# Patient Record
Sex: Female | Born: 1955 | Race: Black or African American | Hispanic: No | Marital: Single | Smoking: Former smoker
Health system: Southern US, Community
[De-identification: ages and names within clinical notes are randomized; demographics above are authoritative.]

## PROBLEM LIST (undated history)

## (undated) DIAGNOSIS — G8929 Other chronic pain: Secondary | ICD-10-CM

## (undated) DIAGNOSIS — E1140 Type 2 diabetes mellitus with diabetic neuropathy, unspecified: Secondary | ICD-10-CM

## (undated) DIAGNOSIS — I10 Essential (primary) hypertension: Secondary | ICD-10-CM

## (undated) DIAGNOSIS — M25569 Pain in unspecified knee: Secondary | ICD-10-CM

## (undated) DIAGNOSIS — E119 Type 2 diabetes mellitus without complications: Secondary | ICD-10-CM

## (undated) HISTORY — PX: OTHER SURGICAL HISTORY: SHX169

---

## 1998-05-19 ENCOUNTER — Emergency Department (HOSPITAL_COMMUNITY): Admission: EM | Admit: 1998-05-19 | Discharge: 1998-05-19 | Payer: Self-pay | Admitting: *Deleted

## 1998-05-19 ENCOUNTER — Encounter: Payer: Self-pay | Admitting: *Deleted

## 1999-07-15 ENCOUNTER — Encounter: Payer: Self-pay | Admitting: Internal Medicine

## 1999-07-15 ENCOUNTER — Encounter: Admission: RE | Admit: 1999-07-15 | Discharge: 1999-07-15 | Payer: Self-pay | Admitting: Family Medicine

## 2000-04-26 ENCOUNTER — Emergency Department (HOSPITAL_COMMUNITY): Admission: EM | Admit: 2000-04-26 | Discharge: 2000-04-26 | Payer: Self-pay | Admitting: Emergency Medicine

## 2000-04-26 ENCOUNTER — Encounter: Payer: Self-pay | Admitting: Emergency Medicine

## 2000-08-08 ENCOUNTER — Other Ambulatory Visit: Admission: RE | Admit: 2000-08-08 | Discharge: 2000-08-08 | Payer: Self-pay | Admitting: *Deleted

## 2000-08-30 ENCOUNTER — Other Ambulatory Visit: Admission: RE | Admit: 2000-08-30 | Discharge: 2000-08-30 | Payer: Self-pay | Admitting: Obstetrics and Gynecology

## 2000-08-30 ENCOUNTER — Encounter (INDEPENDENT_AMBULATORY_CARE_PROVIDER_SITE_OTHER): Payer: Self-pay | Admitting: Specialist

## 2000-09-19 ENCOUNTER — Inpatient Hospital Stay (HOSPITAL_COMMUNITY): Admission: RE | Admit: 2000-09-19 | Discharge: 2000-09-21 | Payer: Self-pay | Admitting: Obstetrics and Gynecology

## 2000-09-19 ENCOUNTER — Encounter (INDEPENDENT_AMBULATORY_CARE_PROVIDER_SITE_OTHER): Payer: Self-pay | Admitting: Specialist

## 2000-10-01 ENCOUNTER — Inpatient Hospital Stay (HOSPITAL_COMMUNITY): Admission: AD | Admit: 2000-10-01 | Discharge: 2000-10-01 | Payer: Self-pay | Admitting: Obstetrics and Gynecology

## 2001-09-07 ENCOUNTER — Other Ambulatory Visit: Admission: RE | Admit: 2001-09-07 | Discharge: 2001-09-07 | Payer: Self-pay | Admitting: Obstetrics and Gynecology

## 2001-09-24 ENCOUNTER — Encounter: Payer: Self-pay | Admitting: Obstetrics and Gynecology

## 2001-09-24 ENCOUNTER — Ambulatory Visit (HOSPITAL_COMMUNITY): Admission: RE | Admit: 2001-09-24 | Discharge: 2001-09-24 | Payer: Self-pay | Admitting: Obstetrics and Gynecology

## 2001-09-25 ENCOUNTER — Emergency Department (HOSPITAL_COMMUNITY): Admission: EM | Admit: 2001-09-25 | Discharge: 2001-09-25 | Payer: Self-pay | Admitting: Emergency Medicine

## 2002-05-03 ENCOUNTER — Encounter: Payer: Self-pay | Admitting: Obstetrics and Gynecology

## 2002-05-03 ENCOUNTER — Ambulatory Visit: Admission: RE | Admit: 2002-05-03 | Discharge: 2002-05-03 | Payer: Self-pay | Admitting: Obstetrics and Gynecology

## 2002-06-05 ENCOUNTER — Encounter (INDEPENDENT_AMBULATORY_CARE_PROVIDER_SITE_OTHER): Payer: Self-pay | Admitting: *Deleted

## 2002-06-05 ENCOUNTER — Inpatient Hospital Stay (HOSPITAL_COMMUNITY): Admission: RE | Admit: 2002-06-05 | Discharge: 2002-06-15 | Payer: Self-pay | Admitting: Obstetrics and Gynecology

## 2002-06-09 ENCOUNTER — Encounter: Payer: Self-pay | Admitting: Obstetrics and Gynecology

## 2002-06-11 ENCOUNTER — Encounter: Payer: Self-pay | Admitting: General Surgery

## 2002-09-09 ENCOUNTER — Other Ambulatory Visit: Admission: RE | Admit: 2002-09-09 | Discharge: 2002-09-09 | Payer: Self-pay | Admitting: Obstetrics and Gynecology

## 2003-03-23 ENCOUNTER — Emergency Department (HOSPITAL_COMMUNITY): Admission: EM | Admit: 2003-03-23 | Discharge: 2003-03-23 | Payer: Self-pay | Admitting: Emergency Medicine

## 2003-11-24 ENCOUNTER — Other Ambulatory Visit: Admission: RE | Admit: 2003-11-24 | Discharge: 2003-11-24 | Payer: Self-pay | Admitting: Obstetrics and Gynecology

## 2004-06-09 ENCOUNTER — Ambulatory Visit (HOSPITAL_COMMUNITY): Admission: RE | Admit: 2004-06-09 | Discharge: 2004-06-09 | Payer: Self-pay | Admitting: Orthopedic Surgery

## 2004-06-09 ENCOUNTER — Ambulatory Visit (HOSPITAL_BASED_OUTPATIENT_CLINIC_OR_DEPARTMENT_OTHER): Admission: RE | Admit: 2004-06-09 | Discharge: 2004-06-09 | Payer: Self-pay | Admitting: Orthopedic Surgery

## 2005-06-07 ENCOUNTER — Emergency Department (HOSPITAL_COMMUNITY): Admission: EM | Admit: 2005-06-07 | Discharge: 2005-06-07 | Payer: Self-pay | Admitting: Family Medicine

## 2005-09-13 ENCOUNTER — Emergency Department (HOSPITAL_COMMUNITY): Admission: EM | Admit: 2005-09-13 | Discharge: 2005-09-13 | Payer: Self-pay | Admitting: Family Medicine

## 2007-01-26 ENCOUNTER — Other Ambulatory Visit: Admission: RE | Admit: 2007-01-26 | Discharge: 2007-01-26 | Payer: Self-pay | Admitting: Obstetrics and Gynecology

## 2007-09-18 ENCOUNTER — Emergency Department (HOSPITAL_COMMUNITY): Admission: EM | Admit: 2007-09-18 | Discharge: 2007-09-18 | Payer: Self-pay | Admitting: Emergency Medicine

## 2008-07-01 ENCOUNTER — Emergency Department (HOSPITAL_COMMUNITY): Admission: EM | Admit: 2008-07-01 | Discharge: 2008-07-01 | Payer: Self-pay | Admitting: Family Medicine

## 2010-06-01 LAB — GLUCOSE, CAPILLARY: Glucose-Capillary: 100 mg/dL — ABNORMAL HIGH (ref 70–99)

## 2010-07-09 NOTE — H&P (Signed)
NAME:  [name] [name], [name] [name]                      ACCOUNT NO.:  [id number]   MEDICAL RECORD NO.:  [id number]                   PATIENT TYPE:  INP   LOCATION:  NA                                   FACILITY:  [facility]   PHYSICIAN:  James A. Zigmund Daniel, M.D.             DATE OF BIRTH:  May 13, 1955   DATE OF ADMISSION:  06/05/2002  DATE OF DISCHARGE:                                HISTORY & PHYSICAL   HISTORY OF PRESENT ILLNESS:  The patient is a 55 year old gravida 2, para 1,  AB 1, status post total abdominal hysterectomy who was found July 2003 to  have bilateral ovarian cysts after complaining of left lower quadrant  discomfort.  At that time she was attempting to get an issue with  hypertension under control and was told she would need to get that under  control before becoming an operative candidate.  CA 125 was drawn and was  normal.  The patient also had a CT scan August 2003 which revealed bilateral  adnexal masses.  The one on the left side measured 8 cm in greatest diameter  and contained a few septations.  The one on the right appeared to be  approximately 8.7 cm in greatest diameter, multiloculated, with at least two  areas of septation.  There was also a 2 cm inguinal lymph node identified on  the right.  For these reasons, the patient now felt to be a candidate for  exploratory laparotomy with bilateral salpingo-oophorectomy and indicated  procedures.  Due to the uncertainty regarding the diagnosis, she has been  asked to have a bowel prep and will be done at [facility] with Dr. Carlena Bjornstad on standby.   MEDICATIONS:  1. Norvasc one daily.  2. Lotensin one daily.  3. Xanax p.r.n.   PAST MEDICAL HISTORY:  Hypertension.   PAST SURGICAL HISTORY:  1. Right partial mastectomy - benign.  2. Appendectomy as a child.  3. Total abdominal hysterectomy with lysis of omental adhesions.   ALLERGIES:  1. DARVOCET.  2. IBUPROFEN.   FAMILY HISTORY:  Hypertension.   SOCIAL  HISTORY:  The patient denies use of tobacco.  She does drink alcohol,  possibly excessively.   REVIEW OF SYSTEMS:  Noncontributory.   PHYSICAL EXAMINATION:  GENERAL:  Well-developed, well-nourished pleasant  black female in no acute distress.  VITAL SIGNS:  Afebrile; vital signs stable.  SKIN:  Warm and dry without lesions.  LYMPHATICS:  There is no supraclavicular, cervical, or inguinal adenopathy.  HEENT:  Normocephalic.  NECK:  Supple without thyromegaly.  CHEST:  Lungs are clear.  CARDIAC:  Regular rate and rhythm without murmurs, gallops, or rubs.  BREASTS:  Exam deferred.  ABDOMEN:  Soft, nontender without masses or organomegaly.  There is a  longitudinal surgical scar between the umbilicus and symphysis pubis  consistent with an incision that healed by secondary intention.  PELVIC:  Exam deferred until examination under anesthesia.  IMPRESSION:  1. Bilateral adnexal mass - differential includes benign neoplasm,     endometriosis, physiologic.  Doubt malignant neoplasm.  2. Left lower quadrant discomfort - probably secondary to bilateral adnexal     mass.  3. Hypertension.  4. Alcohol use, possible abuse.   PLAN:  Exploratory laparotomy with bilateral salpingo-oophorectomy.  Risks,  benefits, complications and alternatives fully discussed with the patient.  The possibility of staging procedure discussed and accepted.  Questions  invited and answered.                                               James A. Zigmund Daniel, M.D.    JAM/MEDQ  D:  06/05/2002  T:  06/05/2002  Job:  [id number]

## 2010-07-09 NOTE — Op Note (Signed)
NAME:  [name] [name], [name] [name]                      ACCOUNT NO.:  [id number]   MEDICAL RECORD NO.:  [id number]                   PATIENT TYPE:  INP   LOCATION:  X004                                 [facility]AC[facility]  [facility]   PHYSICIAN:  Gerda Diss. Zigmund Daniel, M.D.             DATE OF BIRTH:  [birth year]   DATE OF PROCEDURE:  06/05/2002  DATE OF DISCHARGE:                                 OPERATIVE REPORT   PREOPERATIVE DIAGNOSES:  1. Bilateral adnexal masses.  2. Status post hysterectomy.   POSTOPERATIVE DIAGNOSES:  1. Left adnexal cyst.  2. Peritoneal cyst.  3. Multiple pelvic adhesions including the omentum to the anterior abdominal     wall and multiple loops of small bowel and small adhesed bowel to the     adnexa bilaterally.   PROCEDURE:  1. Exploratory laparotomy.  2. Bilateral salpingo-oophorectomy.  3. Lysis of adhesions.  4. Removal of peritoneal cyst.   SURGEON:  James A. Zigmund Daniel, M.D.   ASSISTANT:  Joaquin Bend, M.D.   ANESTHESIA:  General.   ESTIMATED BLOOD LOSS:  Less than 100 mL.   COMPLICATIONS:  None.   PACKS AND DRAINS:  Foley.   COUNTS:  Sponge, needle, and instrument count reported as correct x2.   PROCEDURE:  The patient was taken to the operating room placed in a dorsal  supine position.  After adequate general anesthesia was administered she was  prepped and draped in the usual manner for abdominal surgery.  A  longitudinal incision was made in the midline between the previous two  paramedian longitudinal scars.  The incision was carried to the umbilicus to  the symphysis pubis and slightly to the left of the umbilicus as well.  The  subcutaneous tissues were sharply and bluntly dissected down into the fascia  which was nicked with a knife and incised longitudinally with Mayo scissors.  Upon entry into the abdominal cavity it was discerned that patient had  several adhesions from the omentum to the anterior abdominal wall which  needed to be reduced in  order to carry out the dissection.  These were  clamped, cut, and secured with 3-0 Vicryl ties.  As the dissection was  carried further, the adhesions between multiple loops of small bowel and  remainder of the pelvis were encountered and gradually dissected free as  best as possible.  Peritoneal washings were then obtained and submitted  separately to nephrology for cytologic studies.  After dissection was  carried further, a large cystic structure was encountered which  spontaneously ruptured and released a large amount of clear fluid.  Adhesions were further lysed revealing the left ovary which was adherent to  the colon as well as to the left pelvic side wall.  The infundibulopelvic  ligament was isolated, clamped, cut, and doubly secured with number 1  chromic catgut.  The left ovary and what appeared to be a portion of the  left fallopian tube  were gradually dissected free from the left pelvic side  wall as well as from the left colon.  It was submitted to pathology for  histologic studies including frozen section.  The frozen section returned as  benign.  As the dissection was carried to the patient's right side a  peritoneal cyst was encountered.  It was approximately 2 cm in diameter.  It  was dissected free and excised separately and submitted separately to  pathology for histologic studies.  As the adhesions were further lysed from  the right aspect of the patient's pelvis, the right ovary came into view,  yet it was similarly adherent to the right pelvic side wall and appeared to  have, at least a rudimentary portion of fallopian tube contained with it.  The infundibulopelvic ligament was isolated.  It was clamped, cut, and  doubly secured with number 1 chromic catgut.  The right ovary was gradually  dissected free from the right pelvic side wall without difficulty.  The  ureters were noted bilaterally well below the plane of dissection.  The  above procedure was accomplished  with a Balfour and placed with the small  bowel packed cephalad as best as possible.  It was impossible to remove all  of the adhesions from the multiple loops of small bowel and the omentum to  the anterior abdominal wall.  However, adhesiolysis was accomplished as best  as possible and hemostasis noted.  Copious irrigation was accomplished.  At  this point the patient was felt to have benefitted maximally from the  surgical procedure.  All of the packs were removed.  The fascia and  peritoneum were then closed with a number 1 PDS in a modified Smead-Jones  fashion.  The subcutaneous layer was thoroughly irrigated.  The skin was  then closed with staples. The patient tolerated the procedure extremely well  and was returned to the recovery room in good condition.  At the conclusion  of the procedure the urine was clear and copious.                                               James A. Zigmund Daniel, M.D.    JAM/MEDQ  D:  06/05/2002  T:  06/05/2002  Job:  [id number]

## 2010-07-09 NOTE — H&P (Signed)
[facility]  Patient:    [name] [name], [name] [name]                   MRN: [id number] Adm. Date:  [id number] Attending:  Kendal Hymen                         History and Physical  HISTORY OF PRESENT ILLNESS:   The patient is a 55 year old gravida 2, para 2 who presented to me approximately Sep 12, 2000 after the death of her physician.  She had a known pelvoabdominal mass and was in the process of discussing surgical removal, when her doctor died.  She has a known fibroid uterus.  She also has abnormal uterine bleeding.  Endometrial biopsy was performed 2000/09/12, which revealed weakly proliferative endometrium.  The patient is for total abdominal hysterectomy with possible bilateral salpingo-oophorectomy.  PAST MEDICAL HISTORY:  MEDICAL:                      Hypertension.  SURGICAL:                     1.  Right partial mastectomy -- benign.                               2.  Appendectomy as a child.  ALLERGIES:                    DARVOCET, IBUPROFEN.  MEDICATIONS:                  1.  Norvasc one p.o. q.d.                               2.  Lotensin one p.o. q.d.  FAMILY HISTORY:               Positive for hypertension.  SOCIAL HISTORY:               The patient denies the use of tobacco.  She does drink alcohol.  REVIEW OF SYSTEMS:            Noncontributory.  PHYSICAL EXAMINATION:  GENERAL:                      Well-developed, well-nourished, pleasant black female; in no acute distress.  VITAL SIGNS:                  Afebrile.  Vital signs stable.  SKIN:                         Warm and dry without lesions.  LYMPH:                        There are no supraclavicular, cervical or inguinal adenopathy.  HEENT:                        Normocephalic, atraumatic.  NECK:                         Supple without thyromegaly.  CHEST:  Lungs are clear.  CARDIAC:                      Regular rate and rhythm without  murmurs, gallops or rubs.  BREAST:                       Deferred.  ABDOMEN:                      Soft an nontender; without masses or organomegaly.  There is a longitudinal surgical scar between the umbilicus and symphysis pubis, consistent with an incision that healed by secondary intention.  PELVIC:                       Deferred until examination under anesthesia.  ACCESSORY CLINICAL FINDINGS:  Ultrasound performed August 31, 2000 revealed a fibroid uterus, with largest fibroid being 4.8 cm in greatest diameter.  In addition there was a 2.2 cm right adnexal cyst and a 2.6 cm left adnexal cyst.  IMPRESSION:                   1.  Fibroid uterus.                               2.  Bilateral ovarian cysts, largely cystic                                   follicles.                               3.  Abnormal uterine bleeding, probably                                   secondary to #1.                               4.  Hypertension.                               5.  Alcohol use.  PLAN:                         Total abdominal hysterectomy with possible bilateral salpingo-oophorectomy and indicated procedures.  Risks, benefits, complications and alternatives were fully discussed with the patient.  She states she understands and accepts.  Decision on whether to preserve or remove the ovaries and fallopian tubes is left to the operators sole discretion intraoperatively.  In the event of surgical removal, the patient understands she would need to consider hormone replacement therapy for the indefinite future.  She agrees to same. DD:  09/19/00 TD:  09/19/00 Job: [id number]

## 2010-07-09 NOTE — Discharge Summary (Signed)
[facility]  Patient:    [name] [name], [name] [name] Visit Number: [id number] MRN: [id number]          Service Type: Attending:  Gerda Diss. Zigmund Daniel, M.D. Dictated by:   Gerda Diss Zigmund Daniel, M.D. Adm. Date:  09/19/00 Disc. Date: 09/21/00                             Discharge Summary  DISCHARGE DIAGNOSES:          1. Fibroid uterus.                               2. Adenomyosis.                               3. Abnormal uterine bleeding.                               4. Omental adhesions to the anterior abdominal                                  wall.  OPERATIONS AND SPECIAL PROCEDURES:           1. Total abdominal hysterectomy.                               2. Lysis of adhesions.  CONSULTATIONS:                None.  DISCHARGE MEDICATIONS:        1. Tylox one p.o. q.3-4h. p.r.n. pain.                               2. Ativan 2 mg q.4-6h. p.r.n. agitation.  HISTORY AND PHYSICAL:         This is a 55 year old, gravida 2, para 2, who presented approximately Sep 11, 2022 after the death of her physician with a nonpalpable abdominal mass which she wanted removed. She had a known fibroid uterus and also had experienced abnormal uterine bleeding. Endometrial biopsy was performed 2022-09-11 which revealed weakly proliferative endometrium. For the remainder of the history and physical, please see chart.  HOSPITAL COURSE:              The patient was admitted to [facility]. Admission laboratory studies were drawn. On July 30,2002 she was taken to the operating room and underwent total abdominal hysterectomy along with lysis of omental adhesions to the anterior abdominal wall. The procedure was uncomplicated. Postoperative course was benign. The patient was discharged on the second postoperative day afebrile and in satisfactory condition. It should be mentioned that the anesthesia department gleaned from the amount of medication the patient required  intraoperatively but not gleaned from the amount of agitation postoperatively, that the patient may have an issue with alcohol consumption. This issue may have prompted her to want to go home on the second postoperative day as opposed to later. Nonetheless, she appeared stable for discharge on the second postoperative day and was discharged home ins satisfactory condition.  ACCESSORY CLINICAL FINDINGS:  Hemoglobin and hematocrit on admission were 10-.3 and 30.3, respectively. Repeat  values were obtained July 31,2002 and revealed values of 8.1 and 24.5, respectively. CMET was obtained and was normal. Urinalysis was obtained and was essentially normal as well.  DISPOSITION:                  The patient is to return to [facility] in four to six weeks for postoperative evaluation. Dictated by:   Gerda Diss Zigmund Daniel, M.D. Attending:  Gerda Diss Zigmund Daniel, M.D. DD:  10/26/00 TD:  10/26/00 Job: [id number]

## 2010-07-09 NOTE — Discharge Summary (Signed)
NAME:  [name] [name], [name] [name]                      ACCOUNT NO.:  [id number]   MEDICAL RECORD NO.:  [id number]                   PATIENT TYPE:  INP   LOCATION:  [id number]                                 FACILITY:  [facility]   PHYSICIAN:  Gerda Diss. Zigmund Daniel, M.D.             DATE OF BIRTH:  [birth year]   DATE OF ADMISSION:  06/05/2002  DATE OF DISCHARGE:  06/15/2002                                 DISCHARGE SUMMARY   DISCHARGE DIAGNOSES:  1. 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  2. Focal endometriosis.  3. Tuboovarian adhesions.  4. Hypertension.  5. Alcohol use, possible abuse.  6. Postoperative ileus.   OPERATIONS/PROCEDURES/TREATMENT:  Exploratory laparotomy with bilateral  salpingo-oophorectomy and lysis of adhesions.   CONSULTATIONS:  General surgery, Dr. Zella Richer.   DISCHARGE MEDICATIONS:  Lopressor, Phenergan, Demerol, Ampicillin as per  discharge instruction sheet.   HISTORY OF PRESENT ILLNESS:  This is a 55 year old gravida 2, para 1, AB 1  who is status post total abdominal hysterectomy. Found in July of 2003 to  have bilateral ovarian cysts after complaining of left lower quadrant  discomfort. The patient had a CT scan August of 2003, which revealed  bilateral adnexal masses. The one on the left appeared to be approximately 8  cm in greatest diameter. The one on the right appeared to be approximately  8.7 cm in greatest diameter, multiloculated, with at least two areas of  septations.  She was bowel prepped and brought in for exploratory laparotomy  secondary to same. Dr. Fermin Schwab was on standby.   HOSPITAL COURSE:  The patient was admitted to [facility].  Admission laboratory studies were drawn. On June 05, 2002 she underwent  exploratory laparotomy with bilateral salpingo-oophorectomy and lysis of  adhesions. The procedure was performed by Dr. Lorriane Shire with Dr. Marjory Lies  assisting. The patient's postoperative course was initially  complicated by  transient fever. The etiology of the fever was somewhat elusive. However, it  appeared to be short lived. The patient continued to have lower abdominal  discomfort and poor bowel sounds and progression of GI activity. Dr.  Zella Richer was consulted from general surgery. His diagnosis was  postoperative ileus and hypokalemia. Over the ensuing days, the patient was  initially given an NG tube, which she eventually required removal. The diet  was advanced gingerly and the ileus gradually resolved. In addition to the  surgical consult, a second medical consult was obtained from the patient's  family practice doctor, Dr. Delilah Shan. This was in an effort to optimize her  blood pressure. Dr. Delilah Shan will authorize changes in her anti-hypertensive  medication regimen. She ultimately was normotensive and on June 15, 2002  she was felt stable for discharge home.   ACCESSORY CLINICAL FINDINGS:  Hemoglobin and hematocrit were obtained on  admission and revealed values of 11.1 and 33.5 respectively. White blood  count was 11.1 on the day of admission. Numerous CBC's were obtained  throughout the hospitalization, the last of which was June 14, 2002  revealing hemoglobin of 12.4 and hematocrit 38.3 and white blood count  12,400. Type and Rh revealed B+ blood. Blood culture revealed no growth.    DISPOSITION:  The patient is to return to [facility] and Obst[facility]s  in 4-6 weeks for postoperative evaluation.                                               James A. Zigmund Daniel, M.D.    JAM/MEDQ  D:  08/14/2002  T:  08/14/2002  Job:  [id number]   cc:   Odis Hollingshead, M.D.  [id number] [address] [place] [address]  Fax: [phone]   Marti Sleigh, M.D.   Hector Shade, M.D.

## 2010-07-09 NOTE — Op Note (Signed)
[facility]  Patient:    ZONA, MICHALIK                   MRN: [id number] Proc. Date: 09/19/00 Adm. Date:  [id number] Attending:  Kendal Hymen                           Operative Report  PREOPERATIVE DIAGNOSES:       1. Fibroid uterus.                               2. Abnormal uterine bleeding probably secondary                                  to #1.  POSTOPERATIVE DIAGNOSES:      1. Fibroid uterus.                               2. Omental adhesions to the anterior abdominal                                  wall.  OPERATION:                    1. Total abdominal hysterectomy.                               2. Lysis of adhesions.  SURGEON:                      Gerda Diss. Zigmund Daniel, M.D.  ASSISTANT:                    Joaquin Bend, M.D.  ANESTHESIA:                   General endotracheal.  ESTIMATED BLOOD LOSS:         350 cc.  COMPLICATIONS:                None.  PACKS AND DRAINS:             Foley.  SPONGE, NEEDLE, AND INSTRUMENT COUNTS:  Reported as correct x 2.  DESCRIPTION OF PROCEDURE:     The patient was taken to the operating room and placed in the dorsosupine position.  After adequate general anesthesia was administered, she was placed in the lithotomy position and prepped and draped in the usual manner for abdominal and vaginal surgery.  She was then returned to the supine position.  Foley catheter was placed.  A longitudinal incision was made from the umbilicus to the symphysis pubis. The subcutaneous tissues were sharply and bluntly dissected down to the fascia which was nicked with a knife and incised longitudinally with Mayo scissors. The underlying rectus muscles were separated in the midline, exposing the peritoneum which was elevated with hemostats and entered atraumatically with Metzenbaum scissors.  The incision was extended longitudinally.  The upper abdomen was explored, and omental adhesions were found  to the anterior abdominal wall.  These were lysed as much as possible.  There were some very high omental adhesions to the anterior abdominal wall which were  left untouched as they were out of the field of dissection and felt to be too risky to approach.  The upper abdomen was explored.  The kidneys were present bilaterally.   There was no periaortic adenopathy.  The liver surface was smooth and glistening.  The Balfour retractor was then placed.  Fibroid uterus was noted.  Upper abdomen was packed off.  The ovaries and fallopian tubes appeared to be normal except for some filmy adhesions which were easily lysed. The round ligaments were clamped, cut, and secured with #1 chromic catgut.  A bladder flap was created by incising the anterior uterine serosa and sharply and bluntly dissecting the bladder inferiorly.  The utero-ovarian ligaments, round ligaments, and fallopian tubes were grasped bilaterally, clamped, cut, and doubly secured with #1 chromic catgut.  The ovaries and fallopian tubes were thus preserved.  Uterine vessels were skeletonized.  The uterine vessels were clamped, cut, and doubly secured with #1 chromic catgut.  After this was completed, the corpus was excised with a knife and submitted to pathology for histologic studies.  Additional bites along the cervix were taken with Heaney Ballentine clamps, paying careful attention to hug the cervix and avoid any trauma to the ureters.  One small arterial vessel on the right aspect of the cervix was controlled with right angle clamp and 2-0 interrupted chromic suture.  The vagina was entered anteriorly, and the cervix was excised with Satinsky scissors.  Richardson angle sutures were placed on the lateral vaginal angles.  A circumferential running locking 2-0 Vicryl suture was placed on the vaginal cuff in order to provide hemostasis.  Hemostasis was noted.  One additional interrupted 0 chromic suture was placed in the midline. At  this point, careful inspection was conducted.  The adnexal pedicles were dry.  The area of the parametrial tissues and vaginal cuff appeared to be dry as well.  The ureters were noted to be below the plane of dissection.  Copious irrigation was then accomplished.  After suctioning, the field continued to be dry.  The packs were removed.  The abdomen was then closed with 0 PDS using a modified Smead-Jones technique.  The subcutaneous layer was closed with 0 plain suture.  The skin was closed with staples.  The patient tolerated the procedure extremely well and was returned to the recovery room in good condition. DD:  09/19/00 TD:  09/19/00 Job: [id number]

## 2010-07-09 NOTE — Consult Note (Signed)
NAMECYNDEL, [name] NO.:  [id number]   MEDICAL RECORD NO.:  [id number]                    PATIENT TYPE:   LOCATION:                                       FACILITY:   PHYSICIAN:  Odis Hollingshead, M.D.            DATE OF BIRTH:   DATE OF CONSULTATION:  DATE OF DISCHARGE:                                   CONSULTATION   REASON FOR CONSULTATION:  Ileus, question small bowel obstruction.   HISTORY OF PRESENT ILLNESS:  [name] [name] is a 55 year old female who is now  postoperative day for from a bilateral salpingo-oophorectomy and lysis of  adhesions. She has developed abdominal distention with nausea and vomiting.  An NG tube was attempted but she refused to let it remain in. She states  that the emesis is bilious in fairly large amounts. She does feel distended.  She is passing a little flatus. She has not had a bowel movement. Of note,  she has had two prior abdominal operations including an appendectomy and  total abdominal hysterectomy. She currently is fairly comfortable with  minimal pain.   PAST MEDICAL HISTORY:  Hypertension.   PAST SURGICAL HISTORY:  1. Right breast biopsy.  2. Appendectomy.  3. Total abdominal hysterectomy.   CURRENT MEDICATIONS:  Include HCTZ, Avapro, Protonix, Xanax every four  hours, Unasyn, Percocet and Phenergan.   REVIEW OF SYSTEMS:  GI: Denies problems with constipation or diarrhea at  home. Denies any diverticular disease, hiatal hernia, or peptic ulcer  disease. GU: She is having a little trouble voiding now and having to strain  some.   PHYSICAL EXAMINATION:  GENERAL: A comfortable female who appears to be in no  acute distress at this time.  VITAL SIGNS: Temperature 101.1, blood pressure 202/94, pulse 114.  ABDOMEN: Slightly firm and distended. There is mild peri-incisional  tenderness. There is a low midline wound that is clean and intact. She has  occasional bowel sounds noted.   LABORATORY DATA:   Remarkable for WBC count of 11,100 and potassium of 3.4.   DIAGNOSTIC IMPRESSION:  Abdominal x-ray's demonstrate some air in some small  bowel and colon but no significant dilatation. There are also a few air  fluids levels noted.   IMPRESSION:  Postoperative abdominal distention with nausea and vomiting. I  favor a postoperative ileus versus partial small bowel obstruction.  Unfortunately, she has refused NG tube. She does have some mild hypokalemia  as well.    PLAN:  Recommend correcting her potassium and continuing the bowel rest in  the hopes that the ileus will resolved. Also try to minimize narcotics as  much as possible.                                               [name] [name].  [name] [name], M.D.    [name] [name]  D:  06/09/2002  T:  06/09/2002  Job:  [id number]

## 2010-07-09 NOTE — Op Note (Signed)
NAMEMEILING, TOOKS            ACCOUNT NO.:  [id number]   MEDICAL RECORD NO.:  [id number]          PATIENT TYPE:  AMB   LOCATION:  [place]                          FACILITY:  [facility]   PHYSICIAN:  Kathalene Frames. Mayer Camel, M.D.   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   PROCEDURE:  Left knee arthroscopic removal of cartilaginous loose bodies,  debridement of grade 3 to grade 4 chondromalacia global to the medial  compartment of the left knee and removal of a complex medial meniscal tear.   SURGEON:  Kathalene Frames. Mayer Camel, M.D.   FIRST ASSISTANT:  None.   ANESTHETIC:  General LMA.   ESTIMATED BLOOD LOSS:  Minimal.   FLUID REPLACEMENT:  550 cc crystalloid.   DRAINS PLACED:  None.   INDICATIONS FOR PROCEDURE:  55 year old woman with known arthritic changes  to her left knee almost bone-on-bone, arthritic changes the medial  compartment, possible loose bodies, catching, popping and near locking  episodes, also consistent with a medial meniscal tear, little if any lateral  arthritis and minimal arthritic changes to the patellofemoral joint. She may  in fact need a knee replacement. However prior to this large procedure, she  would like to attempt arthroscopic cleanout and lavage in order to gain some  time, decrease pain and increase function. Risks, benefits of surgery well  understood by the patient.   DESCRIPTION OF PROCEDURE:  The patient identified by armband, taken to the  operating room at [facility] day surgery center. Appropriate anesthetic monitors  were attached and general LMA anesthesia induced with the patient in the  supine position. Lateral post applied to the table. Appropriate anesthetic  monitors attached and the patient underwent general LMA anesthesia.  The  left lower extremities then prepped and draped in a sterile fashion from the  ankle to the midthigh and using a #11 blade, standard portals were made  inferolateral and inferomedial to the patella after first infiltrating the  area with 2 to 3 cc of half percent Marcaine and epinephrine solution and  another 17 cc into the joint itself using an 18 gauge needle. The  arthroscope was inserted into the inferolateral portal. Diagnostic  arthroscopy revealed a relatively normal patella, grade 3 chondromalacia of  the trochlea which was debrided with some flap tears. Moving into the medial  side we immediately identified grade 3 flap tears surrounding a grade 4  lesion to the distal and posterior femoral condyles as well as the tibial  plateau. The patient also had degenerative extensive tearing of the medial  meniscus medially and posteriorly and this was debrided using a 3.5 gator  sucker shaver as well.  Cartilaginous loose bodies were also encountered in  the suprapatellar pouch and on the medial side and these were taken through  the outflow or through the gator sucker shaver. The ACL  and PCL were intact.  On the lateral side the patient had minimal degenerative tearing of the  lateral meniscus which was lightly debrided as was grade 2 to 3  chondromalacia of the lateral tibial condyle.  At this point the knee was  thoroughly irrigated out with normal saline solution. The gutters cleared  medially and laterally. The arthroscopic instruments removed and a dressing  of Xeroform, 4x4 dressing sponges, Webril and Ace wrap applied. The patient  was then awakened and taken to the recovery room without difficulty.      FJR/MEDQ  D:  06/09/2004  T:  06/09/2004  Job:  [id number]

## 2010-07-09 NOTE — Op Note (Signed)
NAMEZIYAN, BORGA            ACCOUNT NO.:  [id number]   MEDICAL RECORD NO.:  [id number]          PATIENT TYPE:  AMB   LOCATION:  [place]                          FACILITY:  [place]   PHYSICIAN:  Kathalene Frames. Mayer Camel, M.D.   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   PROCEDURE:  Left knee arthroscopic partial medial meniscectomy, debridement  of chondromalacia medial femoral condyle, medial tibial condyle and removal  of loose body.   SURGEON:  Kathalene Frames. Mayer Camel, M.D.   FIRST ASSISTANT:  None.   ANESTHETIC:  General.   ESTIMATED BLOOD LOSS:  Minimal.   FLUID REPLACEMENT:  800 mL crystalloid.   DRAINS PLACED:  None.   TOURNIQUET TIME:  None.   INDICATIONS FOR PROCEDURE:  A 55 year old woman with significant arthritic  changes to her left knee consistent with osteoarthritis who has mechanical  catching and locking episodes and has failed conservative treatment. As a  final conservative measure prior to a total knee arthroplasty, she is taken  for arthroscopic debridement of a presumed medial meniscal tear, removal  loose bodies and debridement of chondromalacia. Risks and benefits of  surgery are well understood by the patient.   DESCRIPTION OF PROCEDURE:  The patient identified by armband, taken the  operating room at [facility].  Appropriate anesthetic monitors were attached and general LMA anesthesia  induced with the patient in supine position. Lateral post was applied to the  table and  the left lower extremity prepped and draped in sterile fashion  from the ankle to the mid thigh. Using a #11 blade, a standard inferomedial  and inferolateral peripatellar portals were then made in the left knee  allowing introduction of the arthroscope through the inferolateral portal  and the outflow through the inferomedial portal. Diagnostic arthroscopy  revealed multiple  cartilaginous loose bodies in the joint fluid which were  taken through the outflow piecemeal with the arthroscopic baskets. The  patellofemoral joint had minimal chondromalacia, but moving to the medial  compartment, grade III to grade IV chondromalacia global of the medial  femoral condyle, medial tibial condyle was identified and debrided with a  3.5 gator sucker shaver as well as extensive degenerative and flap tearing  of the medial meniscus likewise debrided with the gator sucker shaver and  basket forceps. The ACL and PCL noted to be intact although there were notch  osteophytes, lateral compartment of minimal arthritic changes, although  there were some loose bodies noted in the lateral compartments as well. At  this point, the knee was lavaged out with normal saline solution. The  gutters were cleared medially and laterally. The arthroscope was taken  medial  and lateral to the PCL clearing the posterior compartments. The  arthroscopic instruments removed and a dressing of Xeroform, 4x4 dressing  sponges, Webril and Ace wrap applied. The patient was then awakened and  taken to the recovery room without difficulty.      FJR/MEDQ  D:  06/21/2004  T:  06/21/2004  Job:  [id number]

## 2010-11-19 LAB — URIC ACID: Uric Acid, Serum: 10.2 — ABNORMAL HIGH

## 2012-02-29 ENCOUNTER — Emergency Department (HOSPITAL_COMMUNITY)
Admission: EM | Admit: 2012-02-29 | Discharge: 2012-02-29 | Disposition: A | Discharge status: Home / Self Care | Payer: Non-veteran care | Attending: Emergency Medicine | Admitting: Emergency Medicine

## 2012-02-29 ENCOUNTER — Encounter (HOSPITAL_COMMUNITY): Payer: Self-pay | Admitting: Emergency Medicine

## 2012-02-29 DIAGNOSIS — E1149 Type 2 diabetes mellitus with other diabetic neurological complication: Secondary | ICD-10-CM | POA: Insufficient documentation

## 2012-02-29 DIAGNOSIS — E1140 Type 2 diabetes mellitus with diabetic neuropathy, unspecified: Secondary | ICD-10-CM

## 2012-02-29 DIAGNOSIS — G589 Mononeuropathy, unspecified: Secondary | ICD-10-CM | POA: Insufficient documentation

## 2012-02-29 DIAGNOSIS — F411 Generalized anxiety disorder: Secondary | ICD-10-CM | POA: Insufficient documentation

## 2012-02-29 DIAGNOSIS — M259 Joint disorder, unspecified: Secondary | ICD-10-CM | POA: Insufficient documentation

## 2012-02-29 DIAGNOSIS — Z79899 Other long term (current) drug therapy: Secondary | ICD-10-CM | POA: Insufficient documentation

## 2012-02-29 HISTORY — DX: Type 2 diabetes mellitus with diabetic neuropathy, unspecified: E11.40

## 2012-02-29 HISTORY — DX: Type 2 diabetes mellitus without complications: E11.9

## 2012-02-29 MED ORDER — ALPRAZOLAM 0.5 MG PO TABS
1.0000 mg | ORAL_TABLET | Freq: Once | ORAL | Status: AC
  Administered 2012-02-29: 1 mg via ORAL
  Filled 2012-02-29: qty 2

## 2012-02-29 MED ORDER — GABAPENTIN 300 MG PO CAPS
300.0000 mg | ORAL_CAPSULE | Freq: Once | ORAL | Status: AC
  Administered 2012-02-29: 300 mg via ORAL
  Filled 2012-02-29: qty 1

## 2012-02-29 MED ORDER — HYDROXYZINE HCL 25 MG PO TABS
25.0000 mg | ORAL_TABLET | Freq: Once | ORAL | Status: AC
  Administered 2012-02-29: 25 mg via ORAL
  Filled 2012-02-29: qty 1

## 2012-02-29 MED ORDER — ALPRAZOLAM 0.5 MG PO TABS: 0.5000 mg | ORAL_TABLET | Freq: Every evening | ORAL | Status: DC | PRN

## 2012-02-29 MED ORDER — HYDROXYZINE HCL 25 MG PO TABS: 25.0000 mg | ORAL_TABLET | Freq: Four times a day (QID) | ORAL | Status: DC

## 2012-02-29 NOTE — ED Provider Notes (Signed)
History   This chart was scribed for [name], PA-C working with Kathalene Frames, MD by Marin Comment, ED Scribe. This patient was seen in room WTR5/WTR5 and the patient's care was started at 1713.   CSN: [id number]  Arrival date & time 02/29/12  1706   First MD Initiated Contact with Patient 02/29/12 1713      Chief Complaint  Patient presents with  . Foot Burn    The history is provided by the patient. No language interpreter was used.   [name] [name] is a 57 y.o. female who presents to the Emergency Department complaining of constant, gradually worsening bilateral foot pain. She describes the pain as burning with associated itching. She states that she has a h/o DM Type II and neuropathy. She states that her blood sugars are not under controlled. She takes Metformin 1000 units in the morning and night, as well as Glipizide orally. She takes Gabapentin for her neuropathy normally with relief, but states that she hasn't taken any today. She states that she last saw her PCP, Dr. August Albino a month ago. She denies taking anything for her symptoms PTA. She also complains of anxiety which she states she has a hx of. She normally takes Xanax but states she hasn't taken any today. She denies any fevers, chills, nausea or vomiting. She denies any numbness or weakness.   No past medical history on file.  No past surgical history on file.  No family history on file.  History  Substance Use Topics  . Smoking status: Not on file  . Smokeless tobacco: Not on file  . Alcohol Use: Not on file    OB History    No data available      Review of Systems  Constitutional: Negative for fever and chills.  Gastrointestinal: Negative for nausea and vomiting.  Musculoskeletal: Positive for arthralgias.  Skin:       Itching.   Neurological: Negative for weakness and numbness.  Psychiatric/Behavioral: The patient is nervous/anxious.   All other systems reviewed and are  negative.    Allergies  Review of patient's allergies indicates not on file.  Home Medications  No current outpatient prescriptions on file.  BP 160/90  Pulse 124  Temp 98.5 F (36.9 C) (Oral)  Resp 18  SpO2 97%  Physical Exam  Nursing note and vitals reviewed. Constitutional: She is oriented to person, place, and time. She appears well-developed and well-nourished. No distress.       Obese.   HENT:  Head: Normocephalic and atraumatic.  Eyes: Conjunctivae normal and EOM are normal.  Neck: Neck supple. No tracheal deviation present.  Cardiovascular: Normal rate, regular rhythm, normal heart sounds and intact distal pulses.   No murmur heard.      DP and PT pulses are 2+.   Pulmonary/Chest: Effort normal and breath sounds normal. No respiratory distress.  Abdominal: Soft. There is no tenderness.  Musculoskeletal: Normal range of motion.       No extremity edema.   Neurological: She is alert and oriented to person, place, and time.       Sensation intact.   Skin: Skin is warm and dry. No rash noted.       No evidence of rash to the bottom of the feet bilaterally.   Psychiatric: She has a normal mood and affect. She is agitated.       Agitated.     ED Course  Procedures (including critical care time)  DIAGNOSTIC STUDIES:  Oxygen Saturation is 97% on room air, adequate by my interpretation.    COORDINATION OF CARE:  17:31-Discussed planned course of treatment with the patient including Xanax, Neurontin and Hydroxyzine here in ED, who is agreeable at this time.   17:45-Medication Orders: Alprazolam (Xanax) tablet 1 mg-once; Gabapentin (Neurontin) capsule 300 mg-once; Hydroxyzine (Atarax/Vistaril) tablet 25 mg-once.   Labs Reviewed - No data to display No results found.   1. Diabetic neuropathy       MDM  57 year old female with bilateral itching on the bottom of her feet. No rash on feet. Distal pulses intact. Normal sensation. States she did not take her  medications this morning. She missed her gabapentin. She is very anxious which is making this worse. Takes Xanax at home for anxiety. I gave her Xanax, her gabapentin along with Vistaril for itching. I advised her to take her medications as prescribed. She is stable for discharge. Patient states her understanding of plan and is agreeable.   I personally performed the services described in this documentation, which was scribed in my presence. The recorded information has been reviewed and is accurate.      Illene Labrador, PA-C 02/29/12 [phone]

## 2012-02-29 NOTE — ED Notes (Signed)
Patient states that she had an acute attack of her "plerapathy" in the car and threw her shoes out of the window. The patient reports that she has bilateral pain and having an "allegic" reaction to her diabetes. Patient is very anxious

## 2012-03-01 NOTE — ED Provider Notes (Signed)
Medical screening examination/treatment/procedure(s) were performed by non-physician practitioner and as supervising physician I was immediately available for consultation/collaboration.  Kathalene Frames, MD 03/01/12 [phone]

## 2013-02-27 ENCOUNTER — Emergency Department (HOSPITAL_COMMUNITY): Payer: Medicare Other

## 2013-02-27 ENCOUNTER — Emergency Department (HOSPITAL_COMMUNITY)
Admission: EM | Admit: 2013-02-27 | Discharge: 2013-02-28 | Disposition: A | Discharge status: Home / Self Care | Payer: Medicare Other | Attending: Emergency Medicine | Admitting: Emergency Medicine

## 2013-02-27 ENCOUNTER — Encounter (HOSPITAL_COMMUNITY): Payer: Self-pay | Admitting: Emergency Medicine

## 2013-02-27 DIAGNOSIS — W19XXXA Unspecified fall, initial encounter: Secondary | ICD-10-CM

## 2013-02-27 DIAGNOSIS — S8990XA Unspecified injury of unspecified lower leg, initial encounter: Secondary | ICD-10-CM | POA: Insufficient documentation

## 2013-02-27 DIAGNOSIS — Z880 Allergy status to penicillin: Secondary | ICD-10-CM | POA: Insufficient documentation

## 2013-02-27 DIAGNOSIS — M549 Dorsalgia, unspecified: Secondary | ICD-10-CM

## 2013-02-27 DIAGNOSIS — G8929 Other chronic pain: Secondary | ICD-10-CM | POA: Insufficient documentation

## 2013-02-27 DIAGNOSIS — IMO0002 Reserved for concepts with insufficient information to code with codable children: Secondary | ICD-10-CM | POA: Insufficient documentation

## 2013-02-27 DIAGNOSIS — Z79899 Other long term (current) drug therapy: Secondary | ICD-10-CM | POA: Insufficient documentation

## 2013-02-27 DIAGNOSIS — M25562 Pain in left knee: Secondary | ICD-10-CM

## 2013-02-27 DIAGNOSIS — S99919A Unspecified injury of unspecified ankle, initial encounter: Principal | ICD-10-CM

## 2013-02-27 DIAGNOSIS — E1142 Type 2 diabetes mellitus with diabetic polyneuropathy: Secondary | ICD-10-CM | POA: Insufficient documentation

## 2013-02-27 DIAGNOSIS — E1149 Type 2 diabetes mellitus with other diabetic neurological complication: Secondary | ICD-10-CM | POA: Insufficient documentation

## 2013-02-27 DIAGNOSIS — S99929A Unspecified injury of unspecified foot, initial encounter: Principal | ICD-10-CM

## 2013-02-27 DIAGNOSIS — L988 Other specified disorders of the skin and subcutaneous tissue: Secondary | ICD-10-CM | POA: Insufficient documentation

## 2013-02-27 DIAGNOSIS — M25561 Pain in right knee: Secondary | ICD-10-CM

## 2013-02-27 DIAGNOSIS — Y9389 Activity, other specified: Secondary | ICD-10-CM | POA: Insufficient documentation

## 2013-02-27 DIAGNOSIS — Y92410 Unspecified street and highway as the place of occurrence of the external cause: Secondary | ICD-10-CM | POA: Insufficient documentation

## 2013-02-27 HISTORY — DX: Pain in unspecified knee: M25.569

## 2013-02-27 HISTORY — DX: Other chronic pain: G89.29

## 2013-02-27 MED ORDER — OXYCODONE-ACETAMINOPHEN 5-325 MG PO TABS
1.0000 | ORAL_TABLET | Freq: Once | ORAL | Status: AC
  Administered 2013-02-27: 1 via ORAL
  Filled 2013-02-27: qty 1

## 2013-02-27 NOTE — ED Notes (Signed)
Presents with fall yesterday from standing up and knees giving out, caught herself with left knee and both hands. Has chronic knee pain and has had mobility difficulties from knee pain. C/o of bilateral knee pain with left knee worse than right, no deformity noted, also c/o back pain, chronic as well worse after fall.

## 2013-02-27 NOTE — ED Provider Notes (Signed)
CSN: [id number]     Arrival date & time 02/27/13  1827 History  This chart was scribed for non-physician practitioner Vernie Murders, PA-C, working with Mervin Kung, MD by Zettie Pho, ED Scribe. This patient was seen in room TR04C/TR04C and the patient's care was started at 9:10 PM.    Chief Complaint  Patient presents with  . Fall   The history is provided by the patient. No language interpreter was used.   HPI Comments: [name] [name] is a 58 y.o. Female with a history of DM with neuropathy and chronic knee and back pain who presents to the Emergency Department complaining of a fall that occurred yesterday when she states that she tried to get out of her car and her "knees gave out" while pivoting around.  She states that she landed on the cement and onto her bilateral knees and hands. She denies any head injury or LOC.  Patient is complaining of a constant, sore pain with associated swelling to the bilateral knees, left worse than right. Patient is also complaining of some pain to the lower back. Patient states that her current symptoms feels similar to her past chronic pain, but that it was exacerbated after the fall. No other injuries.  She reports taking Naprosyn at home without relief. She denies weakness, abdominal pain, nausea, emesis, chest pain, shortness of breath, dysuria, hematuria, loss of bowel or bladder function. Patient has no other pertinent medical history.    Past Medical History  Diagnosis Date  . Diabetes mellitus without complication   . Neuropathy in diabetes   . Knee pain, chronic    History reviewed. No pertinent past surgical history. History reviewed. No pertinent family history. History  Substance Use Topics  . Smoking status: Never Smoker   . Smokeless tobacco: Not on file  . Alcohol Use: Yes   OB History   Grav Para Term Preterm Abortions TAB SAB Ect Mult Living                 Review of Systems  Respiratory: Negative for shortness of  breath.   Cardiovascular: Negative for chest pain.  Gastrointestinal: Negative for nausea, vomiting and abdominal pain.  Genitourinary: Negative for dysuria and hematuria.  Musculoskeletal: Positive for arthralgias, back pain and joint swelling.  Neurological: Negative for syncope and weakness.  All other systems reviewed and are negative.    Allergies  Morphine and related and Penicillins  Home Medications   Current Outpatient Rx  Name  Route  Sig  Dispense  Refill  . ALPRAZolam (XANAX) 0.5 MG tablet   Oral   Take 0.5 mg by mouth at bedtime as needed. Anxiety         . ALPRAZolam (XANAX) 0.5 MG tablet   Oral   Take 1 tablet (0.5 mg total) by mouth at bedtime as needed for sleep.   10 tablet   0   . glipiZIDE (GLUCOTROL) 5 MG tablet   Oral   Take 5 mg by mouth 2 (two) times daily before a meal.         . hydrOXYzine (ATARAX/VISTARIL) 25 MG tablet   Oral   Take 1 tablet (25 mg total) by mouth every 6 (six) hours.   12 tablet   0   . metFORMIN (GLUCOPHAGE) 1000 MG tablet   Oral   Take 1,000 mg by mouth 2 (two) times daily with a meal.          Triage Vitals: BP 154/99  Pulse 113  Temp(Src) 97.9 F (36.6 C) (Oral)  Resp 18  SpO2 98%  Filed Vitals:   02/27/13 1841 02/27/13 2140 02/27/13 2230 02/28/13 0023  BP: 154/99 153/88 170/99 145/95  Pulse: 113 100 107 107  Temp: 97.9 F (36.6 C) 97.9 F (36.6 C) 97.1 F (36.2 C) 97.4 F (36.3 C)  TempSrc: Oral Oral Oral Oral  Resp: 18  20 16   SpO2: 98% 98% 97% 96%    Physical Exam  Nursing note and vitals reviewed. Constitutional: She is oriented to person, place, and time. She appears well-developed and well-nourished. No distress.  HENT:  Head: Normocephalic and atraumatic.  Right Ear: External ear normal.  Left Ear: External ear normal.  Nose: Nose normal.  Mouth/Throat: Oropharynx is clear and moist.  Eyes: Conjunctivae are normal. Right eye exhibits no discharge. Left eye exhibits no discharge.   Neck: Normal range of motion. Neck supple.  Cardiovascular: Normal rate, regular rhythm, normal heart sounds and intact distal pulses.  Exam reveals no gallop and no friction rub.   No murmur heard. Pulses:      Radial pulses are 2+ on the right side, and 2+ on the left side.       Dorsalis pedis pulses are 2+ on the right side, and 2+ on the left side.  Dorsalis pedis pulses present and equal bilaterally  Pulmonary/Chest: Effort normal and breath sounds normal. No respiratory distress. She has no wheezes. She has no rales. She exhibits no tenderness.  Abdominal: Soft. She exhibits no distension. There is no tenderness.  Musculoskeletal: Normal range of motion. She exhibits edema and tenderness.       Arms:      Legs: Tenderness to palpation to the bilateral knees throughout. No obvious deformities.  Pitting 1+ edema bilaterally.  No calf tenderness.  Diffuse tenderness to palpation to the lower lumbar area.  No thoracic spinal tenderness.  Strength 5/5 in the upper and lower extremities bilaterally.  Patient able to ambulate without difficulty or ataxia.    Neurological: She is alert and oriented to person, place, and time.  Sensation intact  Skin: Skin is warm and dry. She is not diaphoretic.  Patchy white scales to the anterior lower legs bilaterally.  No open wounds, erythema, or ecchymosis.    Psychiatric: She has a normal mood and affect. Her behavior is normal.    ED Course  Procedures (including critical care time)  DIAGNOSTIC STUDIES: Oxygen Saturation is 98% on room air, normal by my interpretation.    COORDINATION OF CARE: 9:16 PM- Ordered x-rays of the bilateral knees and L spine. Discussed that x-ray results indicate some chronic changes, but no acute fractures or other abnormalities. Will order Percocet to manage symptoms.  Discussed treatment plan with patient at bedside and patient verbalized agreement.    Labs Review Labs Reviewed - No data to display  Imaging  Review Dg Lumbar Spine Complete  02/27/2013   CLINICAL DATA:  Fall.  Low back pain.  EXAM: LUMBAR SPINE - COMPLETE 4+ VIEW  COMPARISON:  None.  FINDINGS: Sclerosis of the pubic bodies. Facet joint widening and facet arthropathy at L4-5 and L5-S1 bilaterally with 3 mm of grade 1 anterolisthesis at L4-5 and L5-S1. Mild loss of disc space at L4-5 with endplate sclerosis inferiorly at L4.  No fracture or acute subluxation is observed. Limited right oblique view due to lack of rotation.  Aortoiliac atherosclerotic calcification.  IMPRESSION: 1. Considerable facet arthropathy and mild degenerative anterolisthesis at L4-5 and  L5-S1. Degenerative endplate findings at 075-GRM. 2. Aortoiliac atherosclerosis.   Electronically Signed   By: Sherryl Barters M.D.   On: 02/27/2013 20:04   Dg Knee Complete 4 Views Left  02/27/2013   CLINICAL DATA:  Fall, history diabetes  EXAM: LEFT KNEE - COMPLETE 4+ VIEW  COMPARISON:  None  FINDINGS: Osseous demineralization.  Tricompartmental osteoarthritic changes with joint space narrowing and spur formation greater at medial compartment and patellofemoral joint.  Small knee joint effusion.  Lateral subluxation of tibia.  No acute fracture, dislocation or bone destruction.  Scattered atherosclerotic calcifications.  IMPRESSION: Advanced osteoarthritic changes left knee.  No radiographic evidence acute injury.   Electronically Signed   By: Lavonia Dana M.D.   On: 02/27/2013 19:34   Dg Knee Complete 4 Views Right  02/27/2013   CLINICAL DATA:  Fall  EXAM: RIGHT KNEE - COMPLETE 4+ VIEW  COMPARISON:  None.  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 Moderate to severe tricompartment osteoarthritic changes are present. No acute fracture or dislocation.  IMPRESSION: No acute bony pathology.  Chronic changes.   Electronically Signed   By: Maryclare Bean M.D.   On: 02/27/2013 19:33    EKG Interpretation   None      DG Lumbar Spine Complete (Final  result)  Result time: 02/27/13 20:04:00    Final result by Rad Results In Interface (02/27/13 20:04:00)    Narrative:   CLINICAL DATA: Fall. Low back pain.  EXAM: LUMBAR SPINE - COMPLETE 4+ VIEW  COMPARISON: None.  FINDINGS: Sclerosis of the pubic bodies. Facet joint widening and facet arthropathy at L4-5 and L5-S1 bilaterally with 3 mm of grade 1 anterolisthesis at L4-5 and L5-S1. Mild loss of disc space at L4-5 with endplate sclerosis inferiorly at L4.  No fracture or acute subluxation is observed. Limited right oblique view due to lack of rotation.  Aortoiliac atherosclerotic calcification.  IMPRESSION: 1. Considerable facet arthropathy and mild degenerative anterolisthesis at L4-5 and L5-S1. Degenerative endplate findings at 075-GRM. 2. Aortoiliac atherosclerosis.   Electronically Signed By: Sherryl Barters M.D. On: 02/27/2013 20:04             DG Knee Complete 4 Views Right (Final result)  Result time: 02/27/13 19:33:56    Final result by Rad Results In Interface (02/27/13 19:33:56)    Narrative:   CLINICAL DATA: Fall  EXAM: RIGHT KNEE - COMPLETE 4+ VIEW  COMPARISON: None.  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 Moderate to severe tricompartment osteoarthritic changes are present. No acute fracture or dislocation.  IMPRESSION: No acute bony pathology. Chronic changes.   Electronically Signed By: Maryclare Bean M.D. On: 02/27/2013 19:33             DG Knee Complete 4 Views Left (Final result)  Result time: 02/27/13 19:34:21    Final result by Rad Results In Interface (02/27/13 19:34:21)    Narrative:   CLINICAL DATA: Fall, history diabetes  EXAM: LEFT KNEE - COMPLETE 4+ VIEW  COMPARISON: None  FINDINGS: Osseous demineralization.  Tricompartmental osteoarthritic changes with joint space narrowing and spur formation greater at medial compartment and patellofemoral joint.  Small knee  joint effusion.  Lateral subluxation of tibia.  No acute fracture, dislocation or bone destruction.  Scattered atherosclerotic calcifications.  IMPRESSION: Advanced osteoarthritic changes left knee.  No radiographic evidence acute injury.   Electronically Signed By: Lavonia Dana M.D. On: 02/27/2013 19:34         MDM  [name] [name] is a 58 y.o. female with a history of DM with neuropathy and chronic knee and back pain who presents to the Emergency Department complaining of a fall that occurred yesterday when she states that she tried to get out of her car and her "knees gave out." She states that she landed on the cement and onto her bilateral knees and hands.  Rechecks  11:50 PM = Patient states she feels much better after the Percocet.  States she feels anxious due to pain.  States her leg edema "has been worse" in the past.  States she gets intermittent edema similar to her leg pain today.     Patient evaluated in the ED for a fall with bilateral knee and back pain.  X-rays negative for fx or malalignment, however, showed chronic degenerative changes.  Patient found to have atherosclerosis seen on her x-rays which were communicated to the patient.  Patient neurovascularly intact. Patient given short course of medication for OP management.  Patient with mild tachycardia.  Previous notes reviewed which showed tachycardia.  No chest pain, SOB, or other symptoms.  Patient instructed to follow-up with her PCP tomorrow.  Return precautions, discharge instructions, and follow-up was discussed with the patient before discharge.     Discharge Medication List as of 02/28/2013 12:07 AM    START taking these medications   Details  oxyCODONE-acetaminophen (PERCOCET/ROXICET) 5-325 MG per tablet Take 1 tablet by mouth every 4 (four) hours as needed for severe pain., Starting 02/28/2013, Until Discontinued, Print        Final impressions: 1. Fall, initial encounter   2. Knee pain,  bilateral   3. Back pain      Mercy Moore PA-C   This patient was discussed with Dr. Huel Cote, PA-C 03/01/13 1109

## 2013-02-27 NOTE — ED Notes (Signed)
Pt. Stated, I fell yesterday getting out of the car , i turned and my knees gave away.  My knees are already bad, but now they are worse.  I've had them scoped but they are no good. I fell on both knees.

## 2013-02-28 MED ORDER — OXYCODONE-ACETAMINOPHEN 5-325 MG PO TABS: 1.0000 | ORAL_TABLET | ORAL | Status: DC | PRN

## 2013-02-28 NOTE — Discharge Instructions (Signed)
Take percocet for pain - do not drive while taking this medication it can make you drowsy  Use the RICE method - see below  Follow-up with your doctor tomorrow  Return to the ED if you have any spreading redness/swelling, shortness of breath, chest pain, weakness, loss of sensation, loss of bowel/bladder function, or any other concerns (see below)    Knee Pain Knee pain can be a result of an injury or other medical conditions. Treatment will depend on the cause of your pain. HOME CARE  Only take medicine as told by your doctor.  Keep a healthy weight. Being overweight can make the knee hurt more.  Stretch before exercising or playing sports.  If there is constant knee pain, change the way you exercise. Ask your doctor for advice.  Make sure shoes fit well. Choose the right shoe for the sport or activity.  Protect your knees. Wear kneepads if needed.  Rest when you are tired. GET HELP RIGHT AWAY IF:   Your knee pain does not stop.  Your knee pain does not get better.  Your knee joint feels hot to the touch.  You have a fever. MAKE SURE YOU:   Understand these instructions.  Will watch this condition.  Will get help right away if you are not doing well or get worse. Document Released: 05/06/2008 Document Revised: 05/02/2011 Document Reviewed: 05/06/2008 [facility] Patient Information 2014 [place], [place].  Back Pain, Adult Low back pain is very common. About 1 in 5 people have back pain.The cause of low back pain is rarely dangerous. The pain often gets better over time.About half of people with a sudden onset of back pain feel better in just 2 weeks. About 8 in 10 people feel better by 6 weeks.  CAUSES Some common causes of back pain include:  Strain of the muscles or ligaments supporting the spine.  Wear and tear (degeneration) of the spinal discs.  Arthritis.  Direct injury to the back. DIAGNOSIS Most of the time, the direct cause of low back pain is not  known.However, back pain can be treated effectively even when the exact cause of the pain is unknown.Answering your caregiver's questions about your overall health and symptoms is one of the most accurate ways to make sure the cause of your pain is not dangerous. If your caregiver needs more information, he or she may order lab work or imaging tests (X-rays or MRIs).However, even if imaging tests show changes in your back, this usually does not require surgery. HOME CARE INSTRUCTIONS For many people, back pain returns.Since low back pain is rarely dangerous, it is often a condition that people can learn to [facility] their own.   Remain active. It is stressful on the back to sit or stand in one place. Do not sit, drive, or stand in one place for more than 30 minutes at a time. Take short walks on level surfaces as soon as pain allows.Try to increase the length of time you walk each day.  Do not stay in bed.Resting more than 1 or 2 days can delay your recovery.  Do not avoid exercise or work.Your body is made to move.It is not dangerous to be active, even though your back may hurt.Your back will likely heal faster if you return to being active before your pain is gone.  Pay attention to your body when you bend and lift. Many people have less discomfortwhen lifting if they bend their knees, keep the load close to their bodies,and avoid  twisting. Often, the most comfortable positions are those that put less stress on your recovering back.  Find a comfortable position to sleep. Use a firm mattress and lie on your side with your knees slightly bent. If you lie on your back, put a pillow under your knees.  Only take over-the-counter or prescription medicines as directed by your caregiver. Over-the-counter medicines to reduce pain and inflammation are often the most helpful.Your caregiver may prescribe muscle relaxant drugs.These medicines help dull your pain so you can more quickly return to  your normal activities and healthy exercise.  Put ice on the injured area.  Put ice in a plastic bag.  Place a towel between your skin and the bag.  Leave the ice on for 15-20 minutes, 03-04 times a day for the first 2 to 3 days. After that, ice and heat may be alternated to reduce pain and spasms.  Ask your caregiver about trying back exercises and gentle massage. This may be of some benefit.  Avoid feeling anxious or stressed.Stress increases muscle tension and can worsen back pain.It is important to recognize when you are anxious or stressed and learn ways to manage it.Exercise is a great option. SEEK MEDICAL CARE IF:  You have pain that is not relieved with rest or medicine.  You have pain that does not improve in 1 week.  You have new symptoms.  You are generally not feeling well. SEEK IMMEDIATE MEDICAL CARE IF:   You have pain that radiates from your back into your legs.  You develop new bowel or bladder control problems.  You have unusual weakness or numbness in your arms or legs.  You develop nausea or vomiting.  You develop abdominal pain.  You feel faint. Document Released: 02/07/2005 Document Revised: 08/09/2011 Document Reviewed: 06/28/2010 [facility] Patient Information 2014 [place], [place].   Emergency Department Resource Guide 1) Find a Doctor and Pay Out of Pocket Although you won't have to find out who is covered by your insurance plan, it is a good idea to ask around and get recommendations. You will then need to call the office and see if the doctor you have chosen will accept you as a new patient and what types of options they offer for patients who are self-pay. Some doctors offer discounts or will set up payment plans for their patients who do not have insurance, but you will need to ask so you aren't surprised when you get to your appointment.  2) Contact Your Local Health Department Not all health departments have doctors that can see patients for  sick visits, but many do, so it is worth a call to see if yours does. If you don't know where your local health department is, you can check in your phone book. The CDC also has a tool to help you locate your state's health department, and many state websites also have listings of all of their local health departments.  3) Find a [facility] If your illness is not likely to be very severe or complicated, you may want to try a walk in clinic. These are popping up all over the country in pharmacies, drugstores, and shopping centers. They're usually staffed by nurse practitioners or physician assistants that have been trained to treat common illnesses and complaints. They're usually fairly quick and inexpensive. However, if you have serious medical issues or chronic medical problems, these are probably not your best option.  No Primary Care Doctor: - Call Health Connect at  [phone] - they  can help you locate a primary care doctor that  accepts your insurance, provides certain services, etc. - Physician Referral Service- [phone]  Chronic Pain Problems: Organization         Address  Phone   Notes  [facility]  [phone] Patients need to be referred by their primary care doctor.   Medication Assistance: Organization         Address  Phone   Notes  [facility] Medication [facility] [address] [place], [place] [address] [phone] --Must be a resident of [facility] -- Must have NO insurance coverage whatsoever (no Medicaid/ Medicare, etc.) -- The pt. MUST have a primary care doctor that directs their care regularly and follows them in the community   MedAssist  [phone]   [employer]  [phone]    Agencies that provide inexpensive medical care: Organization         Address  Phone   Notes  [place]  [phone]   [facility]    [phone]   [facility]  [place], [place] [address] [phone]   [place] [address] [place] [phone]   Planned Parenthood    [phone]   [facility]    [phone]   [place] and [place] [address], [place] Phone:  [phone], Fax:  [phone] Hours of Operation:  9 am - 6 pm, M-F.  Also accepts Medicaid/Medicare and self-pay.  [facility] for [place], [address], [place] Phone: [phone], Fax: [phone]. Hours of Operation:  8:30 am - 5:30 pm, M-F.  Also accepts Medicaid and self-pay.  [facility] [place] [address], [place] Phone: [phone]   [place], [place], [place] [phone], Ext. [phone] Mondays & Thursdays: 7-9 AM.  First 15 patients are seen on a first come, first serve basis.    [place] Providers:  Organization         Address  Phone   Notes  [facility] [address], [place] [phone] Also accepts self-pay patients.  [facility] [id number] [place], [place]  [phone]   [place], [address], [place] [phone]   [facility] Family Medicine [address] [place] [phone]   Lucianne Lei [address], [place]   [phone] Only accepts [place] Access [place] patients after they have their name applied to their card.   Self-Pay (no insurance) in [facility] (2-Rh):  Organization         Address  Phone   Notes  Sickle Cell Patients, [facility] Internal Medicine [place] [phone]   [facility] Care [place] [phone]   [facility] [place]  [place], [place], [place] [phone]   [facility]/Dr. Osei-Bonsu  [address] [place] or [address], [place] [phone] Phone number for both [place] and [place] locations is the same.  Urgent Medical and [facility] [address] [place] [phone]   [facility] [place] or [place]  [address] [phone]   [facility] [place] [phone], phone; [phone], fax Sees patients 1st and 3rd Saturday of every month.  Must not qualify for public or private insurance (i.e. Medicaid, Medicare, [place] Health Choice, Veterans' Benefits)  Household income should be no more than 200% of the poverty level The clinic cannot treat you if you are pregnant or think you are pregnant  Sexually transmitted diseases are not treated at the clinic.    Dental Care: Organization         Address  Phone  Notes  [facility] Department of [facility] [place] [phone] Accepts children up to age 41 who are enrolled in [place] or [place]; pregnant women with a Medicaid card; and children who have applied for Medicaid or [place] Health Choice, but were declined, whose parents can pay a reduced fee at time of service.  [facility] [place] Department of [facility]  [address], [place] [phone] Accepts children up to age 40 who are enrolled in [place] or [place]; pregnant women with a Medicaid card; and children who have applied for Medicaid or  Health Choice, but were declined, whose parents can pay a reduced fee at time of service.  [place] Adult Dental Access PROGRAM  [place] [phone] Patients are seen by appointment only. Walk-ins are not accepted. [place] will see patients 64 years of age and older. Monday - Tuesday (8am-5pm) Most Wednesdays (8:30-5pm) $30 per visit, cash only  [facility] Adult Dental Access PROGRAM  [address], [facility] [phone] Patients are seen by appointment only. Walk-ins are not accepted. [place] will see patients 75 years of age and older. One Wednesday Evening (Monthly: Volunteer Based).  $30 per visit, cash only  [place]  [phone] for adults; Children under age 44, call Graduate Pediatric Dentistry at [phone]. Children aged 88-14, please call [phone] to request a pediatric application.  Dental services are provided in all areas of dental care including fillings, crowns and bridges, complete and partial dentures, implants, gum treatment, root canals, and extractions. Preventive care is also provided. Treatment is provided to both adults and children. Patients are selected via a lottery and there is often a waiting list.   [facility] [address] [place]  [phone] [email or web address]   [facility] [address] [place], [place] [phone], Ext. [phone] Second and Fourth Thursday of each month, opens at 6:30 AM; Clinic ends at 9 AM.  Patients are seen on a first-come first-served basis, and a limited number are seen during each clinic.   [facility]  [address] Hillard Danker [place], [place] [phone]   Eligibility Requirements You must have lived in [place], [place], or [place] counties for at least the last three months.   You cannot be eligible for state or federal sponsored [employer], including [employer], [place], or [employer].   You generally cannot be eligible for healthcare insurance through your employer.    How to apply: Eligibility screenings are held every Tuesday and Wednesday afternoon from 1:00 pm until 4:00 pm. You do not need an appointment for the interview!  [facility] [address], [place], [place]   [place]  [place] Department  [place] in the Community: Intensive Outpatient Programs Organization         Address  Phone  Notes  [place]. [address] [place], [place] [phone]   [facility] Outpatient [address], [place], [place]   ADS: Alcohol & Drug Svcs [address], [place], [place]   [place] [address]  [place], [place] or [phone]   Substance Abuse Resources Organization         Address  Phone  Notes  Alcohol and Drug Services  [phone]   [place]  [phone]   [facility] [place]   Chinita Pester  [phone]   Residential & Outpatient Substance Abuse Program  [phone]   Psychological Services Organization         Address  Phone  Notes  [facility] [place]  [place]  [phone]   [place] [address] [place] or [phone]    Mobile Crisis Teams Organization         Address  Phone  Notes  [facility], Mobile Crisis Care Unit  [phone]   [facility]  [address] [place], [place]   [place] Levels [address], [place] [phone]    Self-Help/Support Groups Organization         Address  Phone             Notes  [place]. of [place] - variety of support groups  [place] Call for more information  Narcotics Anonymous (NA), [facility] [address], [employer] [place]  2 meetings at this location   Special educational needs teacher         Address  Phone  Notes  [facility] Residential Treatment [place],    [place]  [phone]-([phone])[phone]   [facility]  [address] [place] [id number], [place], [place]   [place], [place]  [phone] Admissions: 8am-3pm M-F  Incentives Substance [place] [address]    [place], [place] [phone]   [facility] [address] [place], [place], [place]   [facility] [address],  [place], [place]   [facility]ight Programs - Intensive Outpatient [address] Kristeen Mans [address], [place], [place]   [facility] ([place].,  , [place] [phone] or [phone]   [facility] Treatment Services (RTS) [address] [place], [place] Accepts Medicaid  [facility] [place] [address],  [place] [phone] Substance Abuse/Addiction Treatment   [facility] Organization         Address  Phone  Notes  [facility]  [phone]   Domenic Schwab, PhD [address] Arlis [place], [place]   [phone] or [phone]   [place], [place] [phone]   [place] [address], [place], [place] [phone] Insurance/Medicaid/sponsorship through [employer] and Families [address], [id number]  [place], [place] [phone] [place], [place] [phone]    Dr. Adele Schilder  [phone]   [facility] Dept. 1) [address] [place] 2) [place] 3)  [place] [address], [place] [phone]  [phone]   [place] [phone] or [phone] (After Hours)

## 2013-03-03 NOTE — ED Provider Notes (Signed)
Medical screening examination/treatment/procedure(s) were conducted as a shared visit with non-physician practitioner(s) and myself.  I personally evaluated the patient during the encounter.  EKG Interpretation   None      Results for orders placed during the hospital encounter of 07/01/08  GLUCOSE, CAPILLARY      Result Value Range   Glucose-Capillary 100 (*) 70 - 99 mg/dL   Comment 1 Notify RN     Comment 2 Documented in Chart     Dg Lumbar Spine Complete  02/27/2013   CLINICAL DATA:  Fall.  Low back pain.  EXAM: LUMBAR SPINE - COMPLETE 4+ VIEW  COMPARISON:  None.  FINDINGS: Sclerosis of the pubic bodies. Facet joint widening and facet arthropathy at L4-5 and L5-S1 bilaterally with 3 mm of grade 1 anterolisthesis at L4-5 and L5-S1. Mild loss of disc space at L4-5 with endplate sclerosis inferiorly at L4.  No fracture or acute subluxation is observed. Limited right oblique view due to lack of rotation.  Aortoiliac atherosclerotic calcification.  IMPRESSION: 1. Considerable facet arthropathy and mild degenerative anterolisthesis at L4-5 and L5-S1. Degenerative endplate findings at 075-GRM. 2. Aortoiliac atherosclerosis.   Electronically Signed   By: Sherryl Barters M.D.   On: 02/27/2013 20:04   Dg Knee Complete 4 Views Left  02/27/2013   CLINICAL DATA:  Fall, history diabetes  EXAM: LEFT KNEE - COMPLETE 4+ VIEW  COMPARISON:  None  FINDINGS: Osseous demineralization.  Tricompartmental osteoarthritic changes with joint space narrowing and spur formation greater at medial compartment and patellofemoral joint.  Small knee joint effusion.  Lateral subluxation of tibia.  No acute fracture, dislocation or bone destruction.  Scattered atherosclerotic calcifications.  IMPRESSION: Advanced osteoarthritic changes left knee.  No radiographic evidence acute injury.   Electronically Signed   By: Lavonia Dana M.D.   On: 02/27/2013 19:34   Dg Knee Complete 4 Views Right  02/27/2013   CLINICAL DATA:  Fall  EXAM: RIGHT  KNEE - COMPLETE 4+ VIEW  COMPARISON:  None.  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 Moderate to severe tricompartment osteoarthritic changes are present. No acute fracture or dislocation.  IMPRESSION: No acute bony pathology.  Chronic changes.   Electronically Signed   By: Maryclare Bean M.D.   On: 02/27/2013 19:33    Patient seen by me, work up without significant findings. In addition complaint of bilateral leg pain and swelling not likely PE. Patient has follow up tomorrow with her regular provider.   Mervin Kung, MD 03/03/13 [phone]

## 2013-10-27 ENCOUNTER — Encounter (HOSPITAL_COMMUNITY): Payer: Self-pay | Admitting: Emergency Medicine

## 2013-10-27 ENCOUNTER — Emergency Department (HOSPITAL_COMMUNITY): Payer: Medicare Other

## 2013-10-27 ENCOUNTER — Emergency Department (HOSPITAL_COMMUNITY)
Admission: EM | Admit: 2013-10-27 | Discharge: 2013-10-27 | Disposition: A | Discharge status: Home / Self Care | Payer: Medicare Other | Attending: Emergency Medicine | Admitting: Emergency Medicine

## 2013-10-27 DIAGNOSIS — I10 Essential (primary) hypertension: Secondary | ICD-10-CM | POA: Diagnosis not present

## 2013-10-27 DIAGNOSIS — S0003XA Contusion of scalp, initial encounter: Secondary | ICD-10-CM | POA: Diagnosis not present

## 2013-10-27 DIAGNOSIS — Z79899 Other long term (current) drug therapy: Secondary | ICD-10-CM | POA: Diagnosis not present

## 2013-10-27 DIAGNOSIS — R000 Tachycardia, unspecified: Secondary | ICD-10-CM | POA: Diagnosis not present

## 2013-10-27 DIAGNOSIS — Y9389 Activity, other specified: Secondary | ICD-10-CM | POA: Insufficient documentation

## 2013-10-27 DIAGNOSIS — E1142 Type 2 diabetes mellitus with diabetic polyneuropathy: Secondary | ICD-10-CM | POA: Diagnosis not present

## 2013-10-27 DIAGNOSIS — S0990XA Unspecified injury of head, initial encounter: Secondary | ICD-10-CM | POA: Diagnosis present

## 2013-10-27 DIAGNOSIS — H538 Other visual disturbances: Secondary | ICD-10-CM | POA: Diagnosis not present

## 2013-10-27 DIAGNOSIS — S0083XA Contusion of other part of head, initial encounter: Secondary | ICD-10-CM | POA: Insufficient documentation

## 2013-10-27 DIAGNOSIS — Y9289 Other specified places as the place of occurrence of the external cause: Secondary | ICD-10-CM | POA: Diagnosis not present

## 2013-10-27 DIAGNOSIS — IMO0002 Reserved for concepts with insufficient information to code with codable children: Secondary | ICD-10-CM | POA: Diagnosis not present

## 2013-10-27 DIAGNOSIS — G8929 Other chronic pain: Secondary | ICD-10-CM | POA: Insufficient documentation

## 2013-10-27 DIAGNOSIS — E1149 Type 2 diabetes mellitus with other diabetic neurological complication: Secondary | ICD-10-CM | POA: Insufficient documentation

## 2013-10-27 DIAGNOSIS — S1093XA Contusion of unspecified part of neck, initial encounter: Principal | ICD-10-CM

## 2013-10-27 DIAGNOSIS — Z880 Allergy status to penicillin: Secondary | ICD-10-CM | POA: Insufficient documentation

## 2013-10-27 DIAGNOSIS — Y990 Civilian activity done for income or pay: Secondary | ICD-10-CM | POA: Diagnosis not present

## 2013-10-27 DIAGNOSIS — W208XXA Other cause of strike by thrown, projected or falling object, initial encounter: Secondary | ICD-10-CM | POA: Diagnosis not present

## 2013-10-27 HISTORY — DX: Essential (primary) hypertension: I10

## 2013-10-27 MED ORDER — IBUPROFEN 400 MG PO TABS
600.0000 mg | ORAL_TABLET | Freq: Once | ORAL | Status: AC
  Administered 2013-10-27: 600 mg via ORAL
  Filled 2013-10-27 (×2): qty 1

## 2013-10-27 MED ORDER — HYDROCODONE-ACETAMINOPHEN 5-325 MG PO TABS: 1.0000 | ORAL_TABLET | Freq: Four times a day (QID) | ORAL | Status: DC | PRN

## 2013-10-27 MED ORDER — IBUPROFEN 600 MG PO TABS: 600.0000 mg | ORAL_TABLET | Freq: Three times a day (TID) | ORAL | Status: DC | PRN

## 2013-10-27 MED ORDER — HYDROCODONE-ACETAMINOPHEN 5-325 MG PO TABS
1.0000 | ORAL_TABLET | Freq: Once | ORAL | Status: AC
  Administered 2013-10-27: 1 via ORAL
  Filled 2013-10-27: qty 1

## 2013-10-27 NOTE — Discharge Instructions (Signed)
Contusion °A contusion is a deep bruise. Contusions happen when an injury causes bleeding under the skin. Signs of bruising include pain, puffiness (swelling), and discolored skin. The contusion may turn blue, purple, or yellow. °HOME CARE  °· Put ice on the injured area. °¨ Put ice in a plastic bag. °¨ Place a towel between your skin and the bag. °¨ Leave the ice on for 15-20 minutes, 03-04 times a day. °· Only take medicine as told by your doctor. °· Rest the injured area. °· If possible, raise (elevate) the injured area to lessen puffiness. °GET HELP RIGHT AWAY IF:  °· You have more bruising or puffiness. °· You have pain that is getting worse. °· Your puffiness or pain is not helped by medicine. °MAKE SURE YOU:  °· Understand these instructions. °· Will watch your condition. °· Will get help right away if you are not doing well or get worse. °Document Released: 07/27/2007 Document Revised: 05/02/2011 Document Reviewed: 12/13/2010 °[employer]® Patient Information ©2015 [employer]. This information is not intended to replace advice given to you by your health care provider. Make sure you discuss any questions you have with your health care provider. ° °Cryotherapy °Cryotherapy means treatment with cold. Ice or gel packs can be used to reduce both pain and swelling. Ice is the most helpful within the first 24 to 48 hours after an injury or flare-up from overusing a muscle or joint. Sprains, strains, spasms, burning pain, shooting pain, and aches can all be eased with ice. Ice can also be used when recovering from surgery. Ice is effective, has very few side effects, and is safe for most people to use. °PRECAUTIONS  °Ice is not a safe treatment option for people with: °· Raynaud phenomenon. This is a condition affecting small blood vessels in the extremities. Exposure to cold may cause your problems to return. °· Cold hypersensitivity. There are many forms of cold hypersensitivity, including: °¨ Cold urticaria.  Red, itchy hives appear on the skin when the tissues begin to warm after being iced. °¨ Cold erythema. This is a red, itchy rash caused by exposure to cold. °¨ Cold hemoglobinuria. Red blood cells break down when the tissues begin to warm after being iced. The hemoglobin that carry oxygen are passed into the urine because they cannot combine with blood proteins fast enough. °· Numbness or altered sensitivity in the area being iced. °If you have any of the following conditions, do not use ice until you have discussed cryotherapy with your caregiver: °· Heart conditions, such as arrhythmia, angina, or chronic heart disease. °· High blood pressure. °· Healing wounds or open skin in the area being iced. °· Current infections. °· Rheumatoid arthritis. °· Poor circulation. °· Diabetes. °Ice slows the blood flow in the region it is applied. This is beneficial when trying to stop inflamed tissues from spreading irritating chemicals to surrounding tissues. However, if you expose your skin to cold temperatures for too long or without the proper protection, you can damage your skin or nerves. Watch for signs of skin damage due to cold. °HOME CARE INSTRUCTIONS °Follow these tips to use ice and cold packs safely. °· Place a dry or damp towel between the ice and skin. A damp towel will cool the skin more quickly, so you may need to shorten the time that the ice is used. °· For a more rapid response, add gentle compression to the ice. °· Ice for no more than 10 to 20 minutes at a time.   The bonier the area you are icing, the less time it will take to get the benefits of ice.  Check your skin after 5 minutes to make sure there are no signs of a poor response to cold or skin damage.  Rest 20 minutes or more between uses.  Once your skin is numb, you can end your treatment. You can test numbness by very lightly touching your skin. The touch should be so light that you do not see the skin dimple from the pressure of your  fingertip. When using ice, most people will feel these normal sensations in this order: cold, burning, aching, and numbness.  Do not use ice on someone who cannot communicate their responses to pain, such as small children or people with dementia. HOW TO MAKE AN ICE PACK Ice packs are the most common way to use ice therapy. Other methods include ice massage, ice baths, and cryosprays. Muscle creams that cause a cold, tingly feeling do not offer the same benefits that ice offers and should not be used as a substitute unless recommended by your caregiver. To make an ice pack, do one of the following:  Place crushed ice or a bag of frozen vegetables in a sealable plastic bag. Squeeze out the excess air. Place this bag inside another plastic bag. Slide the bag into a pillowcase or place a damp towel between your skin and the bag.  Mix 3 parts water with 1 part rubbing alcohol. Freeze the mixture in a sealable plastic bag. When you remove the mixture from the freezer, it will be slushy. Squeeze out the excess air. Place this bag inside another plastic bag. Slide the bag into a pillowcase or place a damp towel between your skin and the bag. SEEK MEDICAL CARE IF:  You develop white spots on your skin. This may give the skin a blotchy (mottled) appearance.  Your skin turns blue or pale.  Your skin becomes waxy or hard.  Your swelling gets worse. MAKE SURE YOU:   Understand these instructions.  Will watch your condition.  Will get help right away if you are not doing well or get worse. Document Released: 10/04/2010 Document Revised: 06/24/2013 Document Reviewed: 10/04/2010 [facility] Patient Information 2015 [place], [place]. This information is not intended to replace advice given to you by your health care provider. Make sure you discuss any questions you have with your health care provider. You have been given a referral to the ophthalmologist if you continue to have blurry vision is natures, have  you're referred to the emergency department

## 2013-10-27 NOTE — ED Provider Notes (Signed)
CSN: [id number]     Arrival date & time 10/27/13  1855 History   First MD Initiated Contact with Patient 10/27/13 2058     Chief Complaint  Patient presents with  . Head Injury     (Consider location/radiation/quality/duration/timing/severity/associated sxs/prior Treatment) HPI Comments: Patient states she was at work, when she pulled out.  A cabinet and it fell, hitting her on the left for head.  She denies any loss of consciousness.  Be bleeding from her years.  She did not take any medication for her injury.  She states she applied ice for short period of time.  Presents to the emergency department, because "it hurts," and she noticed some blurry vision in her left eye  Patient is a 58 y.o. female presenting with head injury. The history is provided by the patient.  Head Injury Location:  Frontal Time since incident:  6 hours Mechanism of injury: direct blow   Pain details:    Quality:  Aching   Severity:  Moderate   Duration:  6 hours   Timing:  Constant   Progression:  Unchanged Chronicity:  New Relieved by:  None tried Worsened by:  Nothing tried Ineffective treatments:  None tried Associated symptoms: blurred vision   Associated symptoms: no disorientation, no focal weakness, no headaches, no loss of consciousness, no nausea, no neck pain and no vomiting     Past Medical History  Diagnosis Date  . Diabetes mellitus without complication   . Neuropathy in diabetes   . Knee pain, chronic   . Hypertension    History reviewed. No pertinent past surgical history. No family history on file. History  Substance Use Topics  . Smoking status: Never Smoker   . Smokeless tobacco: Not on file  . Alcohol Use: Yes   OB History   Grav Para Term Preterm Abortions TAB SAB Ect Mult Living                 Review of Systems  Constitutional: Negative for fever.  HENT: Positive for facial swelling. Negative for ear discharge.   Eyes: Positive for blurred vision and visual  disturbance.  Gastrointestinal: Negative for nausea and vomiting.  Musculoskeletal: Negative for neck pain.  Skin: Negative for wound.  Neurological: Negative for dizziness, focal weakness, loss of consciousness and headaches.  All other systems reviewed and are negative.     Allergies  Morphine and related and Penicillins  Home Medications   Prior to Admission medications   Medication Sig Start Date End Date Taking? Authorizing Provider  ALPRAZolam Duanne Moron) 0.5 MG tablet Take 0.5 mg by mouth at bedtime as needed for sleep. 02/29/12  Yes Illene Labrador, PA-C  atorvastatin (LIPITOR) 40 MG tablet Take 40 mg by mouth every evening.   Yes Historical Provider, MD  cyclobenzaprine (FLEXERIL) 10 MG tablet Take 10 mg by mouth 3 (three) times daily as needed for muscle spasms.   Yes Historical Provider, MD  gabapentin (NEURONTIN) 800 MG tablet Take 800 mg by mouth 2 (two) times daily.   Yes Historical Provider, MD  glipiZIDE (GLUCOTROL) 5 MG tablet Take 5 mg by mouth daily before breakfast.    Yes Historical Provider, MD  hydrochlorothiazide (HYDRODIURIL) 25 MG tablet Take 25 mg by mouth daily.   Yes Historical Provider, MD  lisinopril (PRINIVIL,ZESTRIL) 40 MG tablet Take 40 mg by mouth every evening.   Yes Historical Provider, MD  metFORMIN (GLUCOPHAGE) 1000 MG tablet Take 1,000 mg by mouth 2 (two) times daily with  a meal.   Yes Historical Provider, MD  oxyCODONE-acetaminophen (PERCOCET/ROXICET) 5-325 MG per tablet Take 1 tablet by mouth every 4 (four) hours as needed for severe pain. 02/28/13  Yes Lucila [place], PA-C  pantoprazole (PROTONIX) 40 MG tablet Take 40 mg by mouth every evening.   Yes Historical Provider, MD  VITAMIN D, CHOLECALCIFEROL, PO Take 1 capsule by mouth daily.   Yes Historical Provider, MD  HYDROcodone-acetaminophen (NORCO/VICODIN) 5-325 MG per tablet Take 1 tablet by mouth every 6 (six) hours as needed for moderate pain. 10/27/13   Garald Balding, NP  ibuprofen (ADVIL,MOTRIN)  600 MG tablet Take 1 tablet (600 mg total) by mouth every 8 (eight) hours as needed. 10/27/13   Garald Balding, NP   BP 197/108  Pulse 118  Temp(Src) 98.2 F (36.8 C) (Oral)  Resp 18  Ht 5\' 2"  (1.575 m)  Wt 180 lb (81.647 kg)  BMI 32.91 kg/m2  SpO2 100% Physical Exam  Nursing note and vitals reviewed. Constitutional: She is oriented to person, place, and time. She appears well-developed and well-nourished.  HENT:  Head: Normocephalic.    Right Ear: External ear normal.  Left Ear: External ear normal.  Mouth/Throat: Oropharynx is clear and moist.  Eyes: Pupils are equal, round, and reactive to light.  Neck: Normal range of motion.  Cardiovascular: Regular rhythm.  Tachycardia present.   Pulmonary/Chest: Effort normal.  Musculoskeletal: Normal range of motion.  Neurological: She is alert and oriented to person, place, and time. No cranial nerve deficit.  Skin: Skin is warm. No erythema.    ED Course  Procedures (including critical care time) Labs Review Labs Reviewed - No data to display  Imaging Review Ct Head Wo Contrast  10/27/2013   CLINICAL DATA:  Fall, left-sided headache, dizziness.  EXAM: CT HEAD WITHOUT CONTRAST  TECHNIQUE: Contiguous axial images were obtained from the base of the skull through the vertex without intravenous contrast.  COMPARISON:  None.  FINDINGS: No evidence of parenchymal hemorrhage or extra-axial fluid collection. No mass lesion, mass effect, or midline shift.  No CT evidence of acute infarction.  Subcortical white matter and periventricular small vessel ischemic changes. Intracranial atherosclerosis.  Cerebral volume is within normal limits.  No ventriculomegaly.  The visualized paranasal sinuses are essentially clear. The mastoid air cells are unopacified.  Extracranial hematoma overlying the left frontal bone measuring up to 13 mm in thickness (series 2/ image 20).  No evidence of calvarial fracture.  IMPRESSION: Extracranial hematoma overlying the  left frontal bone measuring up to 13 mm in thickness.  No evidence of calvarial fracture.  No evidence of acute intracranial abnormality.   Electronically Signed   By: Julian Hy M.D.   On: 10/27/2013 22:56     EKG Interpretation None      MDM  Ct scan normal  Final diagnoses:  Traumatic hematoma of forehead, initial encounter  Blurry vision, left eye         Garald Balding, NP 10/27/13 2327

## 2013-10-27 NOTE — ED Notes (Signed)
Was at work, when [employer] fell and hit head. Occurred at 1500. (Denies: LOC, dizziness, nv, sob or neck pain). L parietal hematoma noted. Skin intact. Mentions seeing spots. Rates head pain 9/10. Took goody powder PTA.

## 2013-10-31 NOTE — ED Provider Notes (Signed)
Medical screening examination/treatment/procedure(s) were performed by non-physician practitioner and as supervising physician I was immediately available for consultation/collaboration.   EKG Interpretation None        Fredia Sorrow, MD 10/31/13 [phone]

## 2014-06-28 ENCOUNTER — Inpatient Hospital Stay (HOSPITAL_COMMUNITY): Payer: Medicare (Managed Care)

## 2014-06-28 ENCOUNTER — Inpatient Hospital Stay (HOSPITAL_COMMUNITY)
Admission: EM | Admit: 2014-06-28 | Discharge: 2014-07-02 | DRG: 682 | Disposition: A | Discharge status: Home / Self Care | Payer: Medicare (Managed Care) | Attending: Family Medicine | Admitting: Family Medicine

## 2014-06-28 ENCOUNTER — Emergency Department (HOSPITAL_COMMUNITY): Payer: Medicare (Managed Care)

## 2014-06-28 ENCOUNTER — Encounter (HOSPITAL_COMMUNITY): Payer: Self-pay

## 2014-06-28 DIAGNOSIS — E1121 Type 2 diabetes mellitus with diabetic nephropathy: Secondary | ICD-10-CM | POA: Diagnosis present

## 2014-06-28 DIAGNOSIS — G9340 Encephalopathy, unspecified: Secondary | ICD-10-CM | POA: Diagnosis not present

## 2014-06-28 DIAGNOSIS — D72829 Elevated white blood cell count, unspecified: Secondary | ICD-10-CM | POA: Diagnosis present

## 2014-06-28 DIAGNOSIS — E785 Hyperlipidemia, unspecified: Secondary | ICD-10-CM | POA: Diagnosis present

## 2014-06-28 DIAGNOSIS — F419 Anxiety disorder, unspecified: Secondary | ICD-10-CM | POA: Diagnosis present

## 2014-06-28 DIAGNOSIS — Z880 Allergy status to penicillin: Secondary | ICD-10-CM | POA: Diagnosis not present

## 2014-06-28 DIAGNOSIS — N183 Chronic kidney disease, stage 3 unspecified: Secondary | ICD-10-CM | POA: Diagnosis present

## 2014-06-28 DIAGNOSIS — N179 Acute kidney failure, unspecified: Secondary | ICD-10-CM

## 2014-06-28 DIAGNOSIS — F4310 Post-traumatic stress disorder, unspecified: Secondary | ICD-10-CM | POA: Diagnosis present

## 2014-06-28 DIAGNOSIS — I10 Essential (primary) hypertension: Secondary | ICD-10-CM | POA: Diagnosis present

## 2014-06-28 DIAGNOSIS — E876 Hypokalemia: Secondary | ICD-10-CM | POA: Diagnosis present

## 2014-06-28 DIAGNOSIS — F329 Major depressive disorder, single episode, unspecified: Secondary | ICD-10-CM | POA: Diagnosis present

## 2014-06-28 DIAGNOSIS — G9341 Metabolic encephalopathy: Secondary | ICD-10-CM | POA: Diagnosis present

## 2014-06-28 DIAGNOSIS — F1310 Sedative, hypnotic or anxiolytic abuse, uncomplicated: Secondary | ICD-10-CM | POA: Diagnosis not present

## 2014-06-28 DIAGNOSIS — E870 Hyperosmolality and hypernatremia: Secondary | ICD-10-CM | POA: Diagnosis present

## 2014-06-28 DIAGNOSIS — R197 Diarrhea, unspecified: Secondary | ICD-10-CM | POA: Diagnosis present

## 2014-06-28 DIAGNOSIS — F1290 Cannabis use, unspecified, uncomplicated: Secondary | ICD-10-CM | POA: Diagnosis present

## 2014-06-28 DIAGNOSIS — E869 Volume depletion, unspecified: Secondary | ICD-10-CM | POA: Diagnosis present

## 2014-06-28 DIAGNOSIS — E875 Hyperkalemia: Secondary | ICD-10-CM | POA: Diagnosis present

## 2014-06-28 DIAGNOSIS — I959 Hypotension, unspecified: Secondary | ICD-10-CM | POA: Diagnosis present

## 2014-06-28 DIAGNOSIS — Z90710 Acquired absence of both cervix and uterus: Secondary | ICD-10-CM

## 2014-06-28 DIAGNOSIS — E1169 Type 2 diabetes mellitus with other specified complication: Secondary | ICD-10-CM | POA: Diagnosis present

## 2014-06-28 DIAGNOSIS — E119 Type 2 diabetes mellitus without complications: Secondary | ICD-10-CM | POA: Diagnosis present

## 2014-06-28 DIAGNOSIS — F1994 Other psychoactive substance use, unspecified with psychoactive substance-induced mood disorder: Secondary | ICD-10-CM | POA: Diagnosis present

## 2014-06-28 DIAGNOSIS — F32A Depression, unspecified: Secondary | ICD-10-CM | POA: Diagnosis present

## 2014-06-28 DIAGNOSIS — Z885 Allergy status to narcotic agent status: Secondary | ICD-10-CM | POA: Diagnosis not present

## 2014-06-28 DIAGNOSIS — E1165 Type 2 diabetes mellitus with hyperglycemia: Secondary | ICD-10-CM | POA: Diagnosis present

## 2014-06-28 DIAGNOSIS — F14120 Cocaine abuse with intoxication, uncomplicated: Secondary | ICD-10-CM | POA: Diagnosis not present

## 2014-06-28 DIAGNOSIS — E1140 Type 2 diabetes mellitus with diabetic neuropathy, unspecified: Secondary | ICD-10-CM | POA: Diagnosis present

## 2014-06-28 DIAGNOSIS — F411 Generalized anxiety disorder: Secondary | ICD-10-CM | POA: Diagnosis present

## 2014-06-28 DIAGNOSIS — J9811 Atelectasis: Secondary | ICD-10-CM

## 2014-06-28 LAB — URINALYSIS, ROUTINE W REFLEX MICROSCOPIC
GLUCOSE, UA: NEGATIVE mg/dL
Ketones, ur: NEGATIVE mg/dL
Nitrite: NEGATIVE
PH: 5 (ref 5.0–8.0)
Protein, ur: 30 mg/dL — AB
SPECIFIC GRAVITY, URINE: 1.016 (ref 1.005–1.030)
Urobilinogen, UA: 0.2 mg/dL (ref 0.0–1.0)

## 2014-06-28 LAB — COMPREHENSIVE METABOLIC PANEL
ALT: 21 U/L (ref 14–54)
ANION GAP: 28 — AB (ref 5–15)
AST: 26 U/L (ref 15–41)
Albumin: 4.5 g/dL (ref 3.5–5.0)
Alkaline Phosphatase: 100 U/L (ref 38–126)
BILIRUBIN TOTAL: 0.7 mg/dL (ref 0.3–1.2)
BUN: 141 mg/dL — AB (ref 6–20)
CO2: 7 mmol/L — AB (ref 22–32)
CREATININE: 12.59 mg/dL — AB (ref 0.44–1.00)
Calcium: 10.8 mg/dL — ABNORMAL HIGH (ref 8.9–10.3)
Chloride: 102 mmol/L (ref 101–111)
GFR calc Af Amer: 3 mL/min — ABNORMAL LOW (ref >60)
GFR calc non Af Amer: 3 mL/min — ABNORMAL LOW (ref >60)
GLUCOSE: 229 mg/dL — AB (ref 70–99)
Potassium: >7.5 mmol/L (ref 3.5–5.1)
Sodium: 137 mmol/L (ref 135–145)
Total Protein: 8.3 g/dL — ABNORMAL HIGH (ref 6.5–8.1)

## 2014-06-28 LAB — I-STAT CHEM 8, ED
BUN: 131 mg/dL — ABNORMAL HIGH (ref 6–20)
CALCIUM ION: 1.26 mmol/L (ref 1.13–1.30)
Chloride: 111 mmol/L (ref 101–111)
Creatinine, Ser: 12.5 mg/dL — ABNORMAL HIGH (ref 0.44–1.00)
Glucose, Bld: 225 mg/dL — ABNORMAL HIGH (ref 70–99)
HCT: 45 % (ref 36.0–46.0)
HEMOGLOBIN: 15.3 g/dL — AB (ref 12.0–15.0)
Potassium: 8.4 mmol/L (ref 3.5–5.1)
SODIUM: 134 mmol/L — AB (ref 135–145)
TCO2: 6 mmol/L (ref 0–100)

## 2014-06-28 LAB — BASIC METABOLIC PANEL
ANION GAP: 25 — AB (ref 5–15)
Anion gap: 15 (ref 5–15)
BUN: 133 mg/dL — ABNORMAL HIGH (ref 6–20)
BUN: 125 mg/dL — AB (ref 6–20)
CALCIUM: 9.2 mg/dL (ref 8.9–10.3)
CO2: 5 mmol/L — AB (ref 22–32)
CO2: 10 mmol/L — ABNORMAL LOW (ref 22–32)
CREATININE: 11.26 mg/dL — AB (ref 0.44–1.00)
Calcium: 8.5 mg/dL — ABNORMAL LOW (ref 8.9–10.3)
Chloride: 110 mmol/L (ref 101–111)
Chloride: 113 mmol/L — ABNORMAL HIGH (ref 101–111)
Creatinine, Ser: 9.89 mg/dL — ABNORMAL HIGH (ref 0.44–1.00)
GFR calc non Af Amer: 3 mL/min — ABNORMAL LOW (ref >60)
GFR, EST AFRICAN AMERICAN: 4 mL/min — AB (ref >60)
GFR, EST AFRICAN AMERICAN: 4 mL/min — AB (ref >60)
GFR, EST NON AFRICAN AMERICAN: 4 mL/min — AB (ref >60)
Glucose, Bld: 77 mg/dL (ref 70–99)
Glucose, Bld: 165 mg/dL — ABNORMAL HIGH (ref 70–99)
Potassium: 6.6 mmol/L (ref 3.5–5.1)
Potassium: 7.5 mmol/L (ref 3.5–5.1)
Sodium: 140 mmol/L (ref 135–145)
Sodium: 138 mmol/L (ref 135–145)

## 2014-06-28 LAB — CBC WITH DIFFERENTIAL/PLATELET
Basophils Absolute: 0 10*3/uL (ref 0.0–0.1)
Basophils Relative: 0 % (ref 0–1)
Eosinophils Absolute: 0 10*3/uL (ref 0.0–0.7)
Eosinophils Relative: 0 % (ref 0–5)
HCT: 40.4 % (ref 36.0–46.0)
Hemoglobin: 12.8 g/dL (ref 12.0–15.0)
Lymphocytes Relative: 15 % (ref 12–46)
Lymphs Abs: 1.2 10*3/uL (ref 0.7–4.0)
MCH: 26.1 pg (ref 26.0–34.0)
MCHC: 31.7 g/dL (ref 30.0–36.0)
MCV: 82.4 fL (ref 78.0–100.0)
Monocytes Absolute: 0.4 10*3/uL (ref 0.1–1.0)
Monocytes Relative: 5 % (ref 3–12)
NEUTROS PCT: 80 % — AB (ref 43–77)
Neutro Abs: 6.5 10*3/uL (ref 1.7–7.7)
Platelets: 282 10*3/uL (ref 150–400)
RBC: 4.9 MIL/uL (ref 3.87–5.11)
RDW: 13.6 % (ref 11.5–15.5)
WBC: 8.1 10*3/uL (ref 4.0–10.5)

## 2014-06-28 LAB — CK: CK TOTAL: 123 U/L (ref 38–234)

## 2014-06-28 LAB — I-STAT CG4 LACTIC ACID, ED: Lactic Acid, Venous: 12.81 mmol/L (ref 0.5–2.0)

## 2014-06-28 LAB — GLUCOSE, CAPILLARY
GLUCOSE-CAPILLARY: 187 mg/dL — AB (ref 70–99)
Glucose-Capillary: 158 mg/dL — ABNORMAL HIGH (ref 70–99)
Glucose-Capillary: 55 mg/dL — ABNORMAL LOW (ref 70–99)

## 2014-06-28 LAB — I-STAT ARTERIAL BLOOD GAS, ED
ACID-BASE DEFICIT: 24 mmol/L — AB (ref 0.0–2.0)
Bicarbonate: 5 mEq/L — ABNORMAL LOW (ref 20.0–24.0)
O2 SAT: 94 %
PO2 ART: 100 mmHg (ref 80.0–100.0)
TCO2: 6 mmol/L (ref 0–100)
pCO2 arterial: 18.4 mmHg — CL (ref 35.0–45.0)
pH, Arterial: 7.046 — CL (ref 7.350–7.450)

## 2014-06-28 LAB — URINE MICROSCOPIC-ADD ON

## 2014-06-28 LAB — RAPID URINE DRUG SCREEN, HOSP PERFORMED
Amphetamines: NOT DETECTED
BENZODIAZEPINES: POSITIVE — AB
Barbiturates: NOT DETECTED
COCAINE: POSITIVE — AB
Opiates: NOT DETECTED
Tetrahydrocannabinol: NOT DETECTED

## 2014-06-28 LAB — MRSA PCR SCREENING: MRSA BY PCR: POSITIVE — AB

## 2014-06-28 LAB — ETHANOL

## 2014-06-28 MED ORDER — LORAZEPAM 2 MG/ML IJ SOLN
1.0000 mg | Freq: Once | INTRAMUSCULAR | Status: AC
  Administered 2014-06-28: 1 mg via INTRAVENOUS

## 2014-06-28 MED ORDER — LORAZEPAM 2 MG/ML IJ SOLN
INTRAMUSCULAR | Status: AC
  Administered 2014-06-28: 2 mg
  Filled 2014-06-28: qty 1

## 2014-06-28 MED ORDER — PANTOPRAZOLE SODIUM 40 MG IV SOLR
40.0000 mg | INTRAVENOUS | Status: DC
  Administered 2014-06-28 – 2014-06-30 (×3): 40 mg via INTRAVENOUS
  Filled 2014-06-28 (×5): qty 40

## 2014-06-28 MED ORDER — ONDANSETRON HCL 4 MG/2ML IJ SOLN
4.0000 mg | Freq: Once | INTRAMUSCULAR | Status: AC
  Administered 2014-06-28: 4 mg via INTRAVENOUS
  Filled 2014-06-28: qty 2

## 2014-06-28 MED ORDER — ONDANSETRON HCL 4 MG/2ML IJ SOLN: 4.0000 mg | Freq: Four times a day (QID) | INTRAMUSCULAR | Status: DC | PRN

## 2014-06-28 MED ORDER — HEPARIN SODIUM (PORCINE) 5000 UNIT/ML IJ SOLN: 5000.0000 [IU] | Freq: Three times a day (TID) | INTRAMUSCULAR | Status: DC

## 2014-06-28 MED ORDER — SODIUM CHLORIDE 0.9 % IV SOLN
INTRAVENOUS | Status: DC
  Administered 2014-06-29: 06:00:00 via INTRAVENOUS

## 2014-06-28 MED ORDER — DEXTROSE 50 % IV SOLN
1.0000 | Freq: Once | INTRAVENOUS | Status: AC
  Administered 2014-06-28: 50 mL via INTRAVENOUS
  Filled 2014-06-28: qty 50

## 2014-06-28 MED ORDER — DEXTROSE 50 % IV SOLN
INTRAVENOUS | Status: AC
  Administered 2014-06-28: 50 mL
  Filled 2014-06-28: qty 50

## 2014-06-28 MED ORDER — SODIUM POLYSTYRENE SULFONATE 15 GM/60ML PO SUSP: 60.0000 g | Freq: Once | ORAL | Status: DC

## 2014-06-28 MED ORDER — SODIUM BICARBONATE 8.4 % IV SOLN
100.0000 meq | Freq: Once | INTRAVENOUS | Status: AC
  Administered 2014-06-28: 100 meq via INTRAVENOUS
  Filled 2014-06-28: qty 50

## 2014-06-28 MED ORDER — SODIUM POLYSTYRENE SULFONATE 15 GM/60ML PO SUSP
30.0000 g | Freq: Once | ORAL | Status: AC
  Administered 2014-06-28: 30 g
  Filled 2014-06-28: qty 120

## 2014-06-28 MED ORDER — SODIUM BICARBONATE 8.4 % IV SOLN
INTRAVENOUS | Status: AC
Start: 2014-06-28 — End: 2014-06-29
  Filled 2014-06-28: qty 50

## 2014-06-28 MED ORDER — SODIUM CHLORIDE 0.9 % IV SOLN
1.0000 g | Freq: Once | INTRAVENOUS | Status: AC
  Administered 2014-06-28: 1 g via INTRAVENOUS
  Filled 2014-06-28: qty 10

## 2014-06-28 MED ORDER — DEXTROSE 50 % IV SOLN: 1.0000 | Freq: Once | INTRAVENOUS | Status: DC

## 2014-06-28 MED ORDER — LORAZEPAM 2 MG/ML IJ SOLN: 1.0000 mg | Freq: Once | INTRAMUSCULAR | Status: DC

## 2014-06-28 MED ORDER — LORAZEPAM 2 MG/ML IJ SOLN
1.0000 mg | INTRAMUSCULAR | Status: DC | PRN
  Administered 2014-06-28 – 2014-06-30 (×2): 1 mg via INTRAVENOUS
  Filled 2014-06-28 (×2): qty 1

## 2014-06-28 MED ORDER — INSULIN ASPART 100 UNIT/ML IV SOLN
10.0000 [IU] | Freq: Once | INTRAVENOUS | Status: AC
  Administered 2014-06-28: 10 [IU] via INTRAVENOUS
  Filled 2014-06-28: qty 1

## 2014-06-28 MED ORDER — SODIUM CHLORIDE 0.9 % IV BOLUS (SEPSIS)
1000.0000 mL | Freq: Once | INTRAVENOUS | Status: AC
  Administered 2014-06-28: 1000 mL via INTRAVENOUS

## 2014-06-28 MED ORDER — ONDANSETRON HCL 4 MG/2ML IJ SOLN
4.0000 mg | Freq: Four times a day (QID) | INTRAMUSCULAR | Status: DC | PRN
  Administered 2014-06-28: 4 mg via INTRAVENOUS
  Filled 2014-06-28: qty 4

## 2014-06-28 MED ORDER — HEPARIN SODIUM (PORCINE) 5000 UNIT/ML IJ SOLN
5000.0000 [IU] | Freq: Three times a day (TID) | INTRAMUSCULAR | Status: DC
  Administered 2014-06-28 – 2014-07-02 (×11): 5000 [IU] via SUBCUTANEOUS
  Filled 2014-06-28 (×13): qty 1

## 2014-06-28 MED ORDER — SODIUM POLYSTYRENE SULFONATE 15 GM/60ML PO SUSP
60.0000 g | Freq: Once | ORAL | Status: DC
  Filled 2014-06-28: qty 240

## 2014-06-28 MED ORDER — SODIUM CHLORIDE 0.9 % IV BOLUS (SEPSIS)
2000.0000 mL | Freq: Once | INTRAVENOUS | Status: AC
  Administered 2014-06-28: 2000 mL via INTRAVENOUS
  Administered 2014-06-28: 1000 mL via INTRAVENOUS

## 2014-06-28 NOTE — ED Provider Notes (Signed)
CSN: [id number]     Arrival date & time 06/28/14  1310 History   First MD Initiated Contact with Patient 06/28/14 1348     Chief Complaint  Patient presents with  . Altered Mental Status    Level 5 caveat due to altered mental status  (Consider location/radiation/quality/duration/timing/severity/associated sxs/prior Treatment) Patient is a 59 y.o. female presenting with altered mental status. The history is provided by the patient.  Altered Mental Status  patient was brought in for altered mental status. Reportedly found yelling and screaming in her front yard by a neighbor. Patient is unable to tell me what happened. Sugar was reportedly 200.   Past Medical History  Diagnosis Date  . Diabetes mellitus without complication   . Neuropathy in diabetes   . Knee pain, chronic   . Hypertension    History reviewed. No pertinent past surgical history. History reviewed. No pertinent family history. History  Substance Use Topics  . Smoking status: Never Smoker   . Smokeless tobacco: Not on file  . Alcohol Use: Yes   OB History    No data available     Review of Systems  Unable to perform ROS     Allergies  Morphine and related and Penicillins  Home Medications   Prior to Admission medications   Medication Sig Start Date End Date Taking? Authorizing Provider  ALPRAZolam Duanne Moron) 0.5 MG tablet Take 0.5 mg by mouth at bedtime as needed for sleep. 02/29/12   Robyn M Hess, PA-C  ALPRAZolam Duanne Moron) 1 MG tablet Take 1 mg by mouth 2 (two) times daily. 06/04/14   Historical Provider, MD  atorvastatin (LIPITOR) 40 MG tablet Take 40 mg by mouth every evening.    Historical Provider, MD  cyclobenzaprine (FLEXERIL) 10 MG tablet Take 10 mg by mouth 3 (three) times daily as needed for muscle spasms.    Historical Provider, MD  gabapentin (NEURONTIN) 800 MG tablet Take 800 mg by mouth 2 (two) times daily.    Historical Provider, MD  glipiZIDE (GLUCOTROL) 5 MG tablet Take 5 mg by mouth daily  before breakfast.     Historical Provider, MD  hydrochlorothiazide (HYDRODIURIL) 25 MG tablet Take 25 mg by mouth daily.    Historical Provider, MD  HYDROcodone-acetaminophen (NORCO/VICODIN) 5-325 MG per tablet Take 1 tablet by mouth every 6 (six) hours as needed for moderate pain. 10/27/13   Junius Creamer, NP  hydrOXYzine (VISTARIL) 50 MG capsule  05/29/14   Historical Provider, MD  ibuprofen (ADVIL,MOTRIN) 600 MG tablet Take 1 tablet (600 mg total) by mouth every 8 (eight) hours as needed. 10/27/13   Junius Creamer, NP  lisinopril (PRINIVIL,ZESTRIL) 40 MG tablet Take 40 mg by mouth every evening.    Historical Provider, MD  metFORMIN (GLUCOPHAGE) 1000 MG tablet Take 1,000 mg by mouth 2 (two) times daily with a meal.    Historical Provider, MD  naproxen (NAPROSYN) 500 MG tablet 500 mg daily. 04/07/14   Historical Provider, MD  oxyCODONE-acetaminophen (PERCOCET) 10-325 MG per tablet Take 1 tablet by mouth every 6 (six) hours as needed. 06/04/14   Historical Provider, MD  oxyCODONE-acetaminophen (PERCOCET/ROXICET) 5-325 MG per tablet Take 1 tablet by mouth every 4 (four) hours as needed for severe pain. 02/28/13   Lucila [place], PA-C  pantoprazole (PROTONIX) 40 MG tablet Take 40 mg by mouth every evening.    Historical Provider, MD  VITAMIN D, CHOLECALCIFEROL, PO Take 1 capsule by mouth daily.    Historical Provider, MD  zolpidem (AMBIEN) 10  MG tablet Take 10 mg by mouth at bedtime. 05/23/14   Historical Provider, MD   BP 107/59 mmHg  Pulse 100  Temp(Src) 96.1 F (35.6 C) (Oral)  Resp 24  SpO2 98% Physical Exam  Constitutional: She appears well-developed and well-nourished.  HENT:  Head: Atraumatic.  Eyes: Pupils are equal, round, and reactive to light.  Neck: Neck supple.  Cardiovascular: Normal rate.   Pulmonary/Chest:  tachypnea  Abdominal: There is no tenderness.  Musculoskeletal: Normal range of motion.  Neurological:  Encephalopathic. Unable follow commands. Speaking but is not making much  sense. Does startle somewhat easily.  Skin: Skin is warm.    ED Course  Procedures (including critical care time) Labs Review Labs Reviewed  COMPREHENSIVE METABOLIC PANEL - Abnormal; Notable for the following:    CO2 7 (*)    Glucose, Bld 229 (*)    BUN 141 (*)    Creatinine, Ser 12.59 (*)    Calcium 10.8 (*)    Total Protein 8.3 (*)    GFR calc non Af Amer 3 (*)    GFR calc Af Amer 3 (*)    Anion gap 28 (*)    All other components within normal limits  CBC WITH DIFFERENTIAL/PLATELET - Abnormal; Notable for the following:    Neutrophils Relative % 80 (*)    All other components within normal limits  I-STAT CHEM 8, ED - Abnormal; Notable for the following:    Sodium 134 (*)    Potassium 8.4 (*)    BUN 131 (*)    Creatinine, Ser 12.50 (*)    Glucose, Bld 225 (*)    Hemoglobin 15.3 (*)    All other components within normal limits  I-STAT CG4 LACTIC ACID, ED - Abnormal; Notable for the following:    Lactic Acid, Venous 12.81 (*)    All other components within normal limits  CULTURE, BLOOD (ROUTINE X 2)  CULTURE, BLOOD (ROUTINE X 2)  CULTURE, BLOOD (ROUTINE X 2)  CULTURE, BLOOD (ROUTINE X 2)  URINE CULTURE  URINE CULTURE  ETHANOL  URINE RAPID DRUG SCREEN (HOSP PERFORMED)  URINALYSIS, ROUTINE W REFLEX MICROSCOPIC  CK  BLOOD GAS, ARTERIAL  SODIUM, URINE, RANDOM  CREATININE, URINE, RANDOM  LACTIC ACID, PLASMA  I-STAT CHEM 8, ED    Imaging Review Dg Chest Portable 1 View  06/28/2014   CLINICAL DATA:  Altered mental status, incoherent and mumbling, shortness of breath, diabetes, hypertension  EXAM: PORTABLE CHEST - 1 VIEW  COMPARISON:  Portable exam 1418 hr without priors available for comparison.  FINDINGS: Normal heart size, mediastinal contours, and pulmonary vascularity.  Lungs clear.  No pneumothorax.  Bones unremarkable.  IMPRESSION: Normal exam.   Electronically Signed   By: Lavonia Dana M.D.   On: 06/28/2014 14:23     EKG Interpretation   Date/Time:  Saturday Jun 28 2014 13:48:10 EDT Ventricular Rate:  108 PR Interval:  158 QRS Duration: 78 QT Interval:  328 QTC Calculation: 439 R Axis:   -85 Text Interpretation:  Sinus tachycardia Left axis deviation Anterior  infarct , age undetermined Abnormal ECG T waves peaked Confirmed by  Alvino Chapel  MD, Smokey Melott [phone]) on 06/28/2014 3:36:20 PM      MDM   Final diagnoses:  Acute renal failure  Hyperkalemia  Encephalopathy   patient with encephalopathy. Unknown cause. While in the ER her pressure dropped to the 60s. Fluid boluses given. Lab work came in showed renal failure with a potassium of 8. EKG just shows  some peaked T waves but does have P waves still present. He was repeated and was consistent. Lactic acid 12. Patient is to encephalopathic for head CT right now. Will admit to the ICU.   CRITICAL CARE Performed by: Mackie Pai Total critical care time: 30 Critical care time was exclusive of separately billable procedures and treating other patients. Critical care was necessary to treat or prevent imminent or life-threatening deterioration. Critical care was time spent personally by me on the following activities: development of treatment plan with patient and/or surrogate as well as nursing, discussions with consultants, evaluation of patient's response to treatment, examination of patient, obtaining history from patient or surrogate, ordering and performing treatments and interventions, ordering and review of laboratory studies, ordering and review of radiographic studies, pulse oximetry and re-evaluation of patient's condition.   Davonna Belling, MD 06/28/14 1537

## 2014-06-28 NOTE — ED Notes (Signed)
To room via EMS.  Pts neighbor reports that they heard pt screaming out in driveway/yard area, pt was stumbling, mumbling, incomprehensible words.  They then assisted pt down to ground.

## 2014-06-28 NOTE — ED Notes (Signed)
Paged [phone] to [phone] 

## 2014-06-28 NOTE — Progress Notes (Signed)
eLink Physician-Brief Progress Note Patient Name: [name] [name] DOB: 1955-09-13 MRN: [id number]   Date of Service  06/28/2014  HPI/Events of Note  Patient arrives on unit agitated and combative. Patient given Ativan 1 mg IV X 1. Appears to be more cooperative and less combative.   eICU Interventions  Will order: 1. Ativan 1 mg IV Q 2 hours PRN combativeness or agitation.     Intervention Category Minor Interventions: Agitation / anxiety - evaluation and management  Wallis Vancott Eugene 06/28/2014, 5:24 PM

## 2014-06-28 NOTE — Consult Note (Addendum)
Renal Service Consult Note [place] 06/28/2014 Roney Jaffe D Requesting Physician:  Dr Halford Chessman  Reason for Consult:  Renal failure, hyperkalemia HPI: The patient is a 59 y.o. year-old with hx of HTN and DM presented to ED found by neighbors in the yard stumbling, mumbling, incomprehensible. Brought to ED where K 8.5 and creat 12. Home meds include HCTZ, ibuprofen, lisinopril, metformin, naproxen amongst others. BP in 90's in ED, pt providing little to no history, nauseous, no other details.   EKG shows narrow QRS.  No old labs in the system.   Horrible historian, denies active CP or abd pain.   Chart review: 2002 > TAH for leiomyomata w uterine bleeding 2004 > exlap w BSO and LOA for abd pain w priore TAH   Past Medical History  Past Medical History  Diagnosis Date  . Diabetes mellitus without complication   . Neuropathy in diabetes   . Knee pain, chronic   . Hypertension    Past Surgical History History reviewed. No pertinent past surgical history. Family History History reviewed. No pertinent family history. Social History  reports that she has never smoked. She does not have any smokeless tobacco history on file. She reports that she drinks alcohol. She reports that she uses illicit drugs (Marijuana). Allergies  Allergies  Allergen Reactions  . Morphine And Related Itching  . Penicillins Itching and Nausea And Vomiting   Home medications Prior to Admission medications   Medication Sig Start Date End Date Taking? Authorizing Provider  ALPRAZolam Duanne Moron) 0.5 MG tablet Take 0.5 mg by mouth at bedtime as needed for sleep. 02/29/12   Robyn M Hess, PA-C  atorvastatin (LIPITOR) 40 MG tablet Take 40 mg by mouth every evening.    Historical Provider, MD  cyclobenzaprine (FLEXERIL) 10 MG tablet Take 10 mg by mouth 3 (three) times daily as needed for muscle spasms.    Historical Provider, MD  gabapentin (NEURONTIN) 800 MG tablet Take 800 mg by  mouth 2 (two) times daily.    Historical Provider, MD  glipiZIDE (GLUCOTROL) 5 MG tablet Take 5 mg by mouth daily before breakfast.     Historical Provider, MD  hydrochlorothiazide (HYDRODIURIL) 25 MG tablet Take 25 mg by mouth daily.    Historical Provider, MD  HYDROcodone-acetaminophen (NORCO/VICODIN) 5-325 MG per tablet Take 1 tablet by mouth every 6 (six) hours as needed for moderate pain. 10/27/13   Junius Creamer, NP  ibuprofen (ADVIL,MOTRIN) 600 MG tablet Take 1 tablet (600 mg total) by mouth every 8 (eight) hours as needed. 10/27/13   Junius Creamer, NP  lisinopril (PRINIVIL,ZESTRIL) 40 MG tablet Take 40 mg by mouth every evening.    Historical Provider, MD  metFORMIN (GLUCOPHAGE) 1000 MG tablet Take 1,000 mg by mouth 2 (two) times daily with a meal.    Historical Provider, MD  oxyCODONE-acetaminophen (PERCOCET/ROXICET) 5-325 MG per tablet Take 1 tablet by mouth every 4 (four) hours as needed for severe pain. 02/28/13   Lucila Maine, PA-C  pantoprazole (PROTONIX) 40 MG tablet Take 40 mg by mouth every evening.    Historical Provider, MD  VITAMIN D, CHOLECALCIFEROL, PO Take 1 capsule by mouth daily.    Historical Provider, MD   Liver Function Tests No results for input(s): AST, ALT, ALKPHOS, BILITOT, PROT, ALBUMIN in the last 168 hours. No results for input(s): LIPASE, AMYLASE in the last 168 hours. CBC  Recent Labs Lab 06/28/14 1420 06/28/14 1432  WBC 8.1  --   NEUTROABS  6.5  --   HGB 12.8 15.3*  HCT 40.4 45.0  MCV 82.4  --   PLT 282  --    Basic Metabolic Panel  Recent Labs Lab 06/28/14 1432  NA 134*  K 8.4*  CL 111  GLUCOSE 225*  BUN 131*  CREATININE 12.50*    Filed Vitals:   06/28/14 1337 06/28/14 1508  BP: 107/59   Pulse: 100   Temp: 97.6 F (36.4 C) 96.1 F (35.6 C)  TempSrc:  Oral  Resp: 24   SpO2: 98%    Exam Agitated, restless, responds verbally w simple responses to come questions, making hand gestures to simulate vomiting No rash, cyanosis or  gangrene Sclera anicteric, throat clear Flat neck veins Chest is clear to bases bilat RRR no rub, gallop or M heard Abd scars from prior surg, nontender, ND no ascites No LE edema or UE edema Neuro simple responses only, nonofocal, gen weakness  K 8.5 Creat 12.5  BUN 131 Hb 12.8  WBC 8k   Plt 282 CXR no active disease UA pending  Assessment: 1. Renal failure acute and/or chronic in patient taking nsaid's /ACEi / diuretics at home.  Probable ATN vs functional renal failure from meds and vol depletion. May have underlying CKD, no old records.  No old creatinine in the chart. She is vol depleted on exam, UA is pending.  No history provided by pt and no family here to provide history.  Tried numbers in echart w no success.  2. Hyperkalemia - EKG w/o QRS widening 3. DM 4. HTN on lisinopril, HCTZ 5. AMS ?mental deficiency vs uremia or both 6. Hypotension prob from vol depletion, sepsis possible as well, no fever/ ^wbc though   Plan- conservative care at this time is recommended including foley cath, IV Ca/glu/ insulin, repeat K in 1 hour. Give lots of fluid, she looks quite vol depleted, place foley , kayexalate enema, get renal US. Hopefully w holding nsaid's/ acei's / diuret\ics her renal function will start to improve.  If not, may need hemodialysis. For now get UA, UPC ratio, serum light chains, UPEP/SPEP. Give lots of IVF"s and will hopefully improve w regards to her renal function. Need family input regarding baseline mental status, appears very confused now but don't know baseline. Will follow.   [name] MD (pgr) [phone]    [phone] 06/28/2014, 3:26 PM

## 2014-06-28 NOTE — ED Notes (Signed)
Patient lab results of I-Stat Chem 8 (K) of 8.4 and I-Stat CG4 of 12.81 reported to [place].

## 2014-06-28 NOTE — Progress Notes (Signed)
eLink Physician-Brief Progress Note Patient Name: ASA DAIUTO DOB: 1955-04-06 MRN: [id number]   Date of Service  06/28/2014  HPI/Events of Note  K+ = 6.6.  eICU Interventions  Will order: 1. Place NGT. 2. Kayexalate 30 gm via tube now. 3. NaHCO3- 100 meq IV now. 4. Inform Nephrology of K+ result.      Intervention Category Major Interventions: Acid-Base disturbance - evaluation and management;Electrolyte abnormality - evaluation and management  Miu Chiong Eugene 06/28/2014, 7:14 PM

## 2014-06-28 NOTE — ED Notes (Addendum)
Repeat I-Stat 8 (K)  8.5 Dr.Pickering Given results.

## 2014-06-28 NOTE — ED Notes (Signed)
Per Dr. Alvino Chapel hold CT for now as pt has heaved several times.

## 2014-06-28 NOTE — H&P (Signed)
PULMONARY / CRITICAL CARE MEDICINE   Name: [name] [name] MRN: [id number] DOB: Jun 01, 1955    ADMISSION DATE:  06/28/2014   REFERRING MD : EDP  CHIEF COMPLAINT:  Renal failure  INITIAL PRESENTATION: Stumbling, bumbling and falling  STUDIES:  5/7 renal US>> CT head>>  SIGNIFICANT EVENTS:    HISTORY OF PRESENT ILLNESS:  59 yo female with known DM, Htn,diabetic neuropathy, who was found by a neighbor in her yard, stumbling , tumbling and incoherent. Transported to [facility] ED and initial blood work revealed  K+ 8.4 and creatine of 12.5. She remains in a stunned condition, able to follow commands but speech is not understandable. Of note she is on an ACE-I and metformin as an outpatient. PCCM asked to admit. We will hydrate, repeat lab work to ensure accuracy, treat as needed and admit to ICU. Most likely she will need renal consult and possible short term dialysis. Renal US will be ordered to rule out blockage. Metformin and ACE-I will be discontinued.  PAST MEDICAL HISTORY :   has a past medical history of Diabetes mellitus without complication; Neuropathy in diabetes; Knee pain, chronic; and Hypertension.  has no past surgical history on file. Prior to Admission medications   Medication Sig Start Date End Date Taking? Authorizing Provider  ALPRAZolam Duanne Moron) 0.5 MG tablet Take 0.5 mg by mouth at bedtime as needed for sleep. 02/29/12   Robyn M Hess, PA-C  atorvastatin (LIPITOR) 40 MG tablet Take 40 mg by mouth every evening.    Historical Provider, MD  cyclobenzaprine (FLEXERIL) 10 MG tablet Take 10 mg by mouth 3 (three) times daily as needed for muscle spasms.    Historical Provider, MD  gabapentin (NEURONTIN) 800 MG tablet Take 800 mg by mouth 2 (two) times daily.    Historical Provider, MD  glipiZIDE (GLUCOTROL) 5 MG tablet Take 5 mg by mouth daily before breakfast.     Historical Provider, MD  hydrochlorothiazide (HYDRODIURIL) 25 MG tablet Take 25 mg by mouth daily.    Historical  Provider, MD  HYDROcodone-acetaminophen (NORCO/VICODIN) 5-325 MG per tablet Take 1 tablet by mouth every 6 (six) hours as needed for moderate pain. 10/27/13   Junius Creamer, NP  ibuprofen (ADVIL,MOTRIN) 600 MG tablet Take 1 tablet (600 mg total) by mouth every 8 (eight) hours as needed. 10/27/13   Junius Creamer, NP  lisinopril (PRINIVIL,ZESTRIL) 40 MG tablet Take 40 mg by mouth every evening.    Historical Provider, MD  metFORMIN (GLUCOPHAGE) 1000 MG tablet Take 1,000 mg by mouth 2 (two) times daily with a meal.    Historical Provider, MD  oxyCODONE-acetaminophen (PERCOCET/ROXICET) 5-325 MG per tablet Take 1 tablet by mouth every 4 (four) hours as needed for severe pain. 02/28/13   Lucila Maine, PA-C  pantoprazole (PROTONIX) 40 MG tablet Take 40 mg by mouth every evening.    Historical Provider, MD  VITAMIN D, CHOLECALCIFEROL, PO Take 1 capsule by mouth daily.    Historical Provider, MD   Allergies  Allergen Reactions  . Morphine And Related Itching  . Penicillins Itching and Nausea And Vomiting    FAMILY HISTORY:  has no family status information on file.  SOCIAL HISTORY:  reports that she has never smoked. She does not have any smokeless tobacco history on file. She reports that she drinks alcohol. She reports that she uses illicit drugs (Marijuana).  REVIEW OF SYSTEMS:  NA  SUBJECTIVE:   VITAL SIGNS: Temp:  [97.6 F (36.4 C)] 97.6 F (36.4 C) (05/07  1337) Pulse Rate:  [100] 100 (05/07 1337) Resp:  [24] 24 (05/07 1337) BP: (107)/(59) 107/59 mmHg (05/07 1337) SpO2:  [98 %] 98 % (05/07 1337) HEMODYNAMICS:   VENTILATOR SETTINGS:   INTAKE / OUTPUT: No intake or output data in the 24 hours ending 06/28/14 1452  PHYSICAL EXAMINATION: General: Disheveled AAF, dazed and confused Neuro:  Stunned, follows commands, speech is not understandable, MAE x4 HEENT:  PERL 4 mm, + extraocular movements, tracks and follows  Cardiovascular:  HSR RRR Lungs:  CTA Abdomen:  Obese, + bs, Non  tender Musculoskeletal:  intact Skin:  Rt deltoid old scarred area ? Burn. Multiple pock marks over body  LABS:  CBC  Recent Labs Lab 06/28/14 1420 06/28/14 1432  WBC 8.1  --   HGB 12.8 15.3*  HCT 40.4 45.0  PLT 282  --    Coag's No results for input(s): APTT, INR in the last 168 hours. BMET  Recent Labs Lab 06/28/14 1432  NA 134*  K 8.4*  CL 111  BUN 131*  CREATININE 12.50*  GLUCOSE 225*   Electrolytes No results for input(s): CALCIUM, MG, PHOS in the last 168 hours. Sepsis Markers  Recent Labs Lab 06/28/14 1435  LATICACIDVEN 12.81*   ABG No results for input(s): PHART, PCO2ART, PO2ART in the last 168 hours. Liver Enzymes No results for input(s): AST, ALT, ALKPHOS, BILITOT, ALBUMIN in the last 168 hours. Cardiac Enzymes No results for input(s): TROPONINI, PROBNP in the last 168 hours. Glucose No results for input(s): GLUCAP in the last 168 hours.  Imaging No results found.   ASSESSMENT / PLAN:  PULMONARY OETT A: No acute issue P:   Admit to ICU  CARDIOVASCULAR CVL A:  Hx of HTN P:  Hold antihypertensive  RENAL A:   Acute renal failure  P:   Hydration Stop nephrotoxins Renal consult Renal US  GASTROINTESTINAL A:  GI protection P:   PPI  HEMATOLOGIC A:  No acute issue P:    INFECTIOUS A:   No overt infectious process P:   BCx2 5/7>> UC  5/7>>  Abx: Hold for now  ENDOCRINE A:  DM P:   SSI DC metformin  NEUROLOGIC A:   Confusion from most likely toxic encephalopathy  P:   RASS goal: 0 CT head of head for completeness UDS   FAMILY  - Updates: None at bedside  - Inter-disciplinary family meet or Palliative Care meeting due by:  day 7    TODAY'S SUMMARY:  59 yo female with known DM, Htn,diabetic neuropathy, who was found by a neighbor in her yard, stumbling , tumbling and incoherent. Transported to [facility] ED and initial blood work revealed  K+ 8.4 and creatine of 12.5. She remains in a stunned  condition, able to follow commands but speech is not understandable. Of note she is on an ACE-I and metformin as an outpatient. PCCM asked to admit. We will hydrate, repeat lab work to ensure accuracy, treat as needed and admit to ICU. Most likely she will need renal consult and possible short term dialysis. Renal US will be ordered to rule out blockage. Metformin and ACE-I will be discontinued.  Richardson Landry Minor ACNP Maryanna Shape PCCM Pager [phone] till 3 pm If no answer page [phone] 06/28/2014, 2:58 PM  Reviewed above, examined.  59 yo female found stumbling and mumbling by neighbor outside her house.  Pt not able to provide any hx.  She was hypotensive on initial evaluation in ER with improvement after IV fluid.  She had elevated K, and creatinine >> no baseline records.  She also had lactic acidosis.  No obvious source of infection.  Her medication list includes metformin, ibuprofen, naproxen, and lisinopril.  She is volume depleted on exam.  She has dry mucosa.  She mumbles with stimulation.  She has intermittent jerking movements, but does move all extremities.  Heart sounds regular, no wheeze, abd soft.  Will check FeNa, U/A and microscopy, urine/blood cx, renal u/s.  Insulin/calcium for hyperkalemia and f/u labs.  Continue aggressive volume resuscitation.  Appreciate input from Dr. Jonnie Finner.  Defer HD for now.  Defer Abx for now.  CC time by me independent of APP time is 40 minutes.  Chesley Mires, MD [place] 06/28/2014, 4:19 PM Pager:  [phone] After 3pm call: [phone]

## 2014-06-29 ENCOUNTER — Inpatient Hospital Stay (HOSPITAL_COMMUNITY): Payer: Medicare (Managed Care)

## 2014-06-29 LAB — BLOOD GAS, ARTERIAL
ACID-BASE DEFICIT: 13.7 mmol/L — AB (ref 0.0–2.0)
Bicarbonate: 12.4 mEq/L — ABNORMAL LOW (ref 20.0–24.0)
Drawn by: 398661
O2 CONTENT: 2 L/min
O2 Saturation: 97.6 %
PATIENT TEMPERATURE: 98
PH ART: 7.229 — AB (ref 7.350–7.450)
TCO2: 13.4 mmol/L (ref 0–100)
pCO2 arterial: 30.7 mmHg — ABNORMAL LOW (ref 35.0–45.0)
pO2, Arterial: 130 mmHg — ABNORMAL HIGH (ref 80.0–100.0)

## 2014-06-29 LAB — SODIUM, URINE, RANDOM: SODIUM UR: 80 mmol/L

## 2014-06-29 LAB — BASIC METABOLIC PANEL
Anion gap: 23 — ABNORMAL HIGH (ref 5–15)
Anion gap: 19 — ABNORMAL HIGH (ref 5–15)
Anion gap: 16 — ABNORMAL HIGH (ref 5–15)
BUN: 125 mg/dL — AB (ref 6–20)
BUN: 121 mg/dL — ABNORMAL HIGH (ref 6–20)
BUN: 104 mg/dL — ABNORMAL HIGH (ref 6–20)
CALCIUM: 9.4 mg/dL (ref 8.9–10.3)
CALCIUM: 9 mg/dL (ref 8.9–10.3)
CO2: 12 mmol/L — ABNORMAL LOW (ref 22–32)
CO2: 16 mmol/L — ABNORMAL LOW (ref 22–32)
CO2: 17 mmol/L — AB (ref 22–32)
CREATININE: 7.39 mg/dL — AB (ref 0.44–1.00)
Calcium: 9.5 mg/dL (ref 8.9–10.3)
Chloride: 116 mmol/L — ABNORMAL HIGH (ref 101–111)
Chloride: 121 mmol/L — ABNORMAL HIGH (ref 101–111)
Chloride: 121 mmol/L — ABNORMAL HIGH (ref 101–111)
Creatinine, Ser: 9.9 mg/dL — ABNORMAL HIGH (ref 0.44–1.00)
Creatinine, Ser: 9.27 mg/dL — ABNORMAL HIGH (ref 0.44–1.00)
GFR calc Af Amer: 4 mL/min — ABNORMAL LOW (ref >60)
GFR calc Af Amer: 5 mL/min — ABNORMAL LOW (ref >60)
GFR calc Af Amer: 6 mL/min — ABNORMAL LOW (ref >60)
GFR calc non Af Amer: 4 mL/min — ABNORMAL LOW (ref >60)
GFR calc non Af Amer: 4 mL/min — ABNORMAL LOW (ref >60)
GFR, EST NON AFRICAN AMERICAN: 5 mL/min — AB (ref >60)
Glucose, Bld: 97 mg/dL (ref 70–99)
Glucose, Bld: 97 mg/dL (ref 70–99)
Glucose, Bld: 175 mg/dL — ABNORMAL HIGH (ref 70–99)
Potassium: 6.2 mmol/L (ref 3.5–5.1)
Potassium: 5.1 mmol/L (ref 3.5–5.1)
Potassium: 4.3 mmol/L (ref 3.5–5.1)
SODIUM: 156 mmol/L — AB (ref 135–145)
Sodium: 151 mmol/L — ABNORMAL HIGH (ref 135–145)
Sodium: 154 mmol/L — ABNORMAL HIGH (ref 135–145)

## 2014-06-29 LAB — CBC
HCT: 35.6 % — ABNORMAL LOW (ref 36.0–46.0)
HEMOGLOBIN: 11.2 g/dL — AB (ref 12.0–15.0)
MCH: 25.9 pg — AB (ref 26.0–34.0)
MCHC: 31.5 g/dL (ref 30.0–36.0)
MCV: 82.4 fL (ref 78.0–100.0)
PLATELETS: 381 10*3/uL (ref 150–400)
RBC: 4.32 MIL/uL (ref 3.87–5.11)
RDW: 13.8 % (ref 11.5–15.5)
WBC: 16.2 10*3/uL — AB (ref 4.0–10.5)

## 2014-06-29 LAB — HEPATITIS PANEL, ACUTE
HCV Ab: NEGATIVE
HEP A IGM: NONREACTIVE
HEP B C IGM: NONREACTIVE
Hepatitis B Surface Ag: NEGATIVE

## 2014-06-29 LAB — GLUCOSE, CAPILLARY
Glucose-Capillary: 83 mg/dL (ref 70–99)
Glucose-Capillary: 83 mg/dL (ref 70–99)

## 2014-06-29 LAB — KAPPA/LAMBDA LIGHT CHAINS
Kappa free light chain: 86.12 mg/L — ABNORMAL HIGH (ref 3.30–19.40)
Kappa, lambda light chain ratio: 2.83 — ABNORMAL HIGH (ref 0.26–1.65)
Lambda free light chains: 30.39 mg/L — ABNORMAL HIGH (ref 5.71–26.30)

## 2014-06-29 LAB — PROTEIN / CREATININE RATIO, URINE
Creatinine, Urine: 105.19 mg/dL
PROTEIN CREATININE RATIO: 0.71 mg/mg{creat} — AB (ref 0.00–0.15)
Total Protein, Urine: 75 mg/dL

## 2014-06-29 LAB — HIV ANTIBODY (ROUTINE TESTING W REFLEX): HIV SCREEN 4TH GENERATION: NONREACTIVE

## 2014-06-29 LAB — CREATININE, URINE, RANDOM: Creatinine, Urine: 105.27 mg/dL

## 2014-06-29 LAB — LACTIC ACID, PLASMA: Lactic Acid, Venous: 10.1 mmol/L (ref 0.5–2.0)

## 2014-06-29 LAB — C4 COMPLEMENT: Complement C4, Body Fluid: 40 mg/dL (ref 14–44)

## 2014-06-29 LAB — C3 COMPLEMENT: C3 Complement: 120 mg/dL (ref 82–167)

## 2014-06-29 MED ORDER — MUPIROCIN 2 % EX OINT
1.0000 "application " | TOPICAL_OINTMENT | Freq: Two times a day (BID) | CUTANEOUS | Status: DC
  Administered 2014-06-29 – 2014-07-02 (×7): 1 via NASAL
  Filled 2014-06-29: qty 22

## 2014-06-29 MED ORDER — SODIUM POLYSTYRENE SULFONATE 15 GM/60ML PO SUSP
60.0000 g | Freq: Once | ORAL | Status: AC
Start: 2014-06-28 — End: 2014-06-29
  Administered 2014-06-29: 60 g

## 2014-06-29 MED ORDER — CHLORHEXIDINE GLUCONATE 0.12 % MT SOLN
15.0000 mL | Freq: Two times a day (BID) | OROMUCOSAL | Status: DC
  Administered 2014-06-29 – 2014-07-01 (×4): 15 mL via OROMUCOSAL
  Filled 2014-06-29 (×7): qty 15

## 2014-06-29 MED ORDER — DEXTROSE 5 % IV SOLN
INTRAVENOUS | Status: DC
  Administered 2014-06-29 – 2014-07-01 (×3): via INTRAVENOUS

## 2014-06-29 MED ORDER — CHLORHEXIDINE GLUCONATE CLOTH 2 % EX PADS
6.0000 | MEDICATED_PAD | Freq: Every day | CUTANEOUS | Status: DC
  Administered 2014-06-29 – 2014-07-02 (×4): 6 via TOPICAL

## 2014-06-29 MED ORDER — CETYLPYRIDINIUM CHLORIDE 0.05 % MT LIQD
7.0000 mL | Freq: Two times a day (BID) | OROMUCOSAL | Status: DC
  Administered 2014-06-30 (×2): 7 mL via OROMUCOSAL

## 2014-06-29 NOTE — Progress Notes (Signed)
Hyperkalemia discussed with Dr Jonnie Finner. Orders to give another 60grams. Check bmet at [place]

## 2014-06-29 NOTE — Progress Notes (Signed)
PULMONARY / CRITICAL CARE MEDICINE   Name: [name] [name] MRN: [id number] DOB: 05-25-1955    ADMISSION DATE:  06/28/2014   REFERRING MD : EDP  CHIEF COMPLAINT:  Renal failure  INITIAL PRESENTATION:  59 yo female with acute encephalopathy, AKI, hyperkalemia.  UDS positive for cocaine, benzo's.  STUDIES:  5/7 renal US>> cyst Lt kidney  SIGNIFICANT EVENTS: 5/7 admit, renal consulted  SUBJECTIVE:  Says she is running okay.  VITAL SIGNS: Temp:  [96.1 F (35.6 C)-98.5 F (36.9 C)] 98.5 F (36.9 C) (05/08 0726) Pulse Rate:  [80-130] 122 (05/08 0800) Resp:  [16-30] 25 (05/08 0800) BP: (99-167)/(54-143) 123/71 mmHg (05/08 0800) SpO2:  [93 %-100 %] 100 % (05/08 0800) Weight:  [164 lb 10.9 oz (74.7 kg)-169 lb 5 oz (76.8 kg)] 164 lb 10.9 oz (74.7 kg) (05/08 0500) INTAKE / OUTPUT:  Intake/Output Summary (Last 24 hours) at 06/29/14 0934 Last data filed at 06/29/14 0800  Gross per 24 hour  Intake   3430 ml  Output   2390 ml  Net   1040 ml    PHYSICAL EXAMINATION: General: more alert Neuro: follows simple commands, moves extremities HEENT: dry mucosa Cardiovascular:  Regular, tachycardic Lungs: no wheeze Abdomen: soft, non tender Musculoskeletal: no edema Skin: Multiple pock marks over body  LABS:  CBC  Recent Labs Lab 06/28/14 1420 06/28/14 1432 06/29/14 0257  WBC 8.1  --  16.2*  HGB 12.8 15.3* 11.2*  HCT 40.4 45.0 35.6*  PLT 282  --  381   BMET  Recent Labs Lab 06/28/14 2110 06/29/14 0257 06/29/14 0750  NA 138 151* 156*  K 7.5* 6.2* 5.1  CL 113* 116* 121*  CO2 10* 12* 16*  BUN 125* 125* 121*  CREATININE 9.89* 9.90* 9.27*  GLUCOSE 165* 97 97   Electrolytes  Recent Labs Lab 06/28/14 2110 06/29/14 0257 06/29/14 0750  CALCIUM 8.5* 9.4 9.5   Sepsis Markers  Recent Labs Lab 06/28/14 1435 06/28/14 1705  LATICACIDVEN 12.81* 10.1*   ABG  Recent Labs Lab 06/28/14 1618 06/29/14 0442  PHART 7.046* 7.229*  PCO2ART 18.4* 30.7*  PO2ART  100.0 130*   Liver Enzymes  Recent Labs Lab 06/28/14 1420  AST 26  ALT 21  ALKPHOS 100  BILITOT 0.7  ALBUMIN 4.5   Glucose  Recent Labs Lab 06/28/14 1650 06/28/14 2035 06/28/14 2115 06/29/14 0032 06/29/14 0450  GLUCAP 187* 55* 158* 83 83    Imaging US Renal  06/28/2014   CLINICAL DATA:  Acute renal failure  EXAM: RENAL / URINARY TRACT ULTRASOUND COMPLETE  COMPARISON:  By report from 09/24/2001.  FINDINGS: Right Kidney:  Length: 9.7 cm. Echogenicity within normal limits. No mass or hydronephrosis visualized.  Left Kidney:  Length: 10.5 cm. Hypoechoic structure noted in the upper pole of the left kidney measuring 1.7 cm in greatest dimension. This corresponds to 2 small cysts seen on the previous CT examination. No obstructive changes are noted.  Bladder:  Decompressed by Foley catheter.  IMPRESSION: Cystic structure in the upper pole of the left kidney which by history is stable from a prior CT.  No other focal abnormality is noted.  No obstructive changes noted.   Electronically Signed   By: Inez Catalina M.D.   On: 06/28/2014 19:04   Dg Chest Portable 1 View  06/28/2014   CLINICAL DATA:  Altered mental status, incoherent and mumbling, shortness of breath, diabetes, hypertension  EXAM: PORTABLE CHEST - 1 VIEW  COMPARISON:  Portable exam 1418 hr without priors  available for comparison.  FINDINGS: Normal heart size, mediastinal contours, and pulmonary vascularity.  Lungs clear.  No pneumothorax.  Bones unremarkable.  IMPRESSION: Normal exam.   Electronically Signed   By: Lavonia Dana M.D.   On: 06/28/2014 14:23     ASSESSMENT / PLAN:  PULMONARY A: Atelectasis. P:   Bronchial hygiene  CARDIOVASCULAR A:  Hx of HTN, HLD. P:  Monitor hemodynamics Hold outpt lipitor  RENAL A:   AKI >> not sure what baseline fx is. Gap/non gap metabolic acidosis. Hyperkalemia >> improved. P:   Change IV fluids to D5W F/u BMET, ABG Hold outpt HCTZ, ibuprofen, lisinopril, naproxen F/u HIV,  Hepatitis panel  GASTROINTESTINAL A:  Nutrition. P:   NPO Protonix  HEMATOLOGIC A:  Leukocytosis. P:  F/u CBC SQ heparin  INFECTIOUS A:   No overt infectious process. P:   BCx2 5/7>> UC  5/7>>  Abx: Hold for now  ENDOCRINE A:  Hx of DM. P:   SSI Hold outpt metformin, glucotorol  NEUROLOGIC A:   Acute metabolic encephalopathy >> improved some 5/08. UDS positive for cocaine. P:   Monitor mental status PRN ativan Hold outpt xanax, flexeril, neurotin, percocet, ambien  CC time 35 minutes.  Chesley Mires, MD [facility] Pulmonary/Critical Care 06/29/2014, 9:39 AM Pager:  [phone] After 3pm call: [phone]

## 2014-06-29 NOTE — Progress Notes (Signed)
  [facility] Progress Note   Subjective: making urine, 540 cc yest and 150 cc today so far. K down after kayexalate. A little more alert today. Urine drug screen +for cocaine. Diarrhea + w 1200 out yesterday per tube. Hypotension resolved, serum Na up 156 and fluids changed to D5W  Filed Vitals:   06/29/14 0600 06/29/14 0700 06/29/14 0726 06/29/14 0800  BP: 163/107 167/143  123/71  Pulse: 112 80  122  Temp:   98.5 F (36.9 C)   TempSrc:   Oral   Resp: 16 23  25   Weight:      SpO2: 100% 100%  100%   Exam: Agitated , in restraints, difficult to understand, appears confused No jvd Chest clear bilat RRR no MRG Abd soft ntnd no ascites or hsm No leg or UE edema Neuro is confused, agitated   UA 5/7 5.0, 1.016, 3-6 rbc/wbc per hpf, 30 protein UPC ratio 0.71 UNa 80, UCr 105 Renal US 9.7/ 10 cm kidneys, no hydro  Summary: Adult female presented found disoriented by neighbors, no family / friends here for history. Per old chart had hysterectomy in 2002 for adenomyosis w bleeding. 2004 admit for pelvic pain w bilat ovarian masses underwent exlap with BSO and LOA. Notes from that admission remarked possible etoh abuse. Here now w AMS , severe azotemia, diarrhea and hyperkalemia. Was on ACEi,, nsaids and HCTZ for home meds amongst others. UDS +cocaine        Assessment: 1. Acute kidney failure - in pt with diarrheal illness, vol depletion/ hypotension/ ACEi / NSAID's/ diuretics. Appears to be improving, starting to make urine today after volume resuscitation. K down w kayexalate. Would cont conservative management as this is likely an acute injury as opposed to chronic. Kidneys on [place] are of normal size for pt's small frame.  2. AMS - need family input for baseline MS, have consulted CM 3. Hyperkalemia - better 4. Hypernatremia - IVF"s changed to hypotonic fluids 5. Vol depletion - better, BP's up to normal today 6. Diarrhea - significant. Per primary 7. NIDDM w  neuropathy 8. Hx HTN holding meds  Plan - cont conservative Rx, should start to improve soon. Have d/w primary. ^D5W to 150/hr.     [name] MD  pager [phone]    cell [phone]  06/29/2014, 10:16 AM     Recent Labs Lab 06/28/14 2110 06/29/14 0257 06/29/14 0750  NA 138 151* 156*  K 7.5* 6.2* 5.1  CL 113* 116* 121*  CO2 10* 12* 16*  GLUCOSE 165* 97 97  BUN 125* 125* 121*  CREATININE 9.89* 9.90* 9.27*  CALCIUM 8.5* 9.4 9.5    Recent Labs Lab 06/28/14 1420  AST 26  ALT 21  ALKPHOS 100  BILITOT 0.7  PROT 8.3*  ALBUMIN 4.5    Recent Labs Lab 06/28/14 1420 06/28/14 1432 06/29/14 0257  WBC 8.1  --  16.2*  NEUTROABS 6.5  --   --   HGB 12.8 15.3* 11.2*  HCT 40.4 45.0 35.6*  MCV 82.4  --  82.4  PLT 282  --  381   . Chlorhexidine Gluconate Cloth  6 each Topical Q0600  . heparin subcutaneous  5,000 Units Subcutaneous 3 times per day  . mupirocin ointment  1 application Nasal BID  . pantoprazole (PROTONIX) IV  40 mg Intravenous Q24H  . sodium polystyrene  60 g Rectal Once   . dextrose     LORazepam, ondansetron

## 2014-06-29 NOTE — Progress Notes (Signed)
Potassium 6.2, based on previous call to dr Melvia Heaps, do not call back unless potassium >6.5

## 2014-06-30 DIAGNOSIS — E870 Hyperosmolality and hypernatremia: Secondary | ICD-10-CM

## 2014-06-30 LAB — PROTEIN ELECTROPHORESIS, SERUM
A/G Ratio: 1.1 (ref 0.7–2.0)
Albumin ELP: 3.2 g/dL (ref 3.2–5.6)
Alpha-1-Globulin: 0.2 g/dL (ref 0.1–0.4)
Alpha-2-Globulin: 0.8 g/dL (ref 0.4–1.2)
Beta Globulin: 1.2 g/dL (ref 0.6–1.3)
Gamma Globulin: 0.7 g/dL (ref 0.5–1.6)
Globulin, Total: 2.9 g/dL (ref 2.0–4.5)
Total Protein ELP: 6.1 g/dL (ref 6.0–8.5)

## 2014-06-30 LAB — BLOOD GAS, ARTERIAL
ACID-BASE DEFICIT: 4.6 mmol/L — AB (ref 0.0–2.0)
Bicarbonate: 20 mEq/L (ref 20.0–24.0)
Drawn by: 39866
O2 CONTENT: 3 L/min
O2 Saturation: 96.8 %
Patient temperature: 98.4
TCO2: 21.1 mmol/L (ref 0–100)
pCO2 arterial: 36.8 mmHg (ref 35.0–45.0)
pH, Arterial: 7.353 (ref 7.350–7.450)
pO2, Arterial: 100 mmHg (ref 80.0–100.0)

## 2014-06-30 LAB — BASIC METABOLIC PANEL
Anion gap: 10 (ref 5–15)
BUN: 84 mg/dL — AB (ref 6–20)
CHLORIDE: 116 mmol/L — AB (ref 101–111)
CO2: 23 mmol/L (ref 22–32)
Calcium: 9.1 mg/dL (ref 8.9–10.3)
Creatinine, Ser: 4.81 mg/dL — ABNORMAL HIGH (ref 0.44–1.00)
GFR calc non Af Amer: 9 mL/min — ABNORMAL LOW (ref >60)
GFR, EST AFRICAN AMERICAN: 10 mL/min — AB (ref >60)
Glucose, Bld: 167 mg/dL — ABNORMAL HIGH (ref 70–99)
POTASSIUM: 3.7 mmol/L (ref 3.5–5.1)
Sodium: 149 mmol/L — ABNORMAL HIGH (ref 135–145)

## 2014-06-30 LAB — GLUCOSE, CAPILLARY
GLUCOSE-CAPILLARY: 142 mg/dL — AB (ref 70–99)
GLUCOSE-CAPILLARY: 113 mg/dL — AB (ref 70–99)
GLUCOSE-CAPILLARY: 144 mg/dL — AB (ref 70–99)
Glucose-Capillary: 142 mg/dL — ABNORMAL HIGH (ref 70–99)
Glucose-Capillary: 156 mg/dL — ABNORMAL HIGH (ref 70–99)
Glucose-Capillary: 163 mg/dL — ABNORMAL HIGH (ref 70–99)

## 2014-06-30 LAB — CBC
HCT: 33.4 % — ABNORMAL LOW (ref 36.0–46.0)
HEMOGLOBIN: 10.8 g/dL — AB (ref 12.0–15.0)
MCH: 25.8 pg — ABNORMAL LOW (ref 26.0–34.0)
MCHC: 32.3 g/dL (ref 30.0–36.0)
MCV: 79.7 fL (ref 78.0–100.0)
PLATELETS: 337 10*3/uL (ref 150–400)
RBC: 4.19 MIL/uL (ref 3.87–5.11)
RDW: 13.5 % (ref 11.5–15.5)
WBC: 14.5 10*3/uL — ABNORMAL HIGH (ref 4.0–10.5)

## 2014-06-30 LAB — VITAMIN D 25 HYDROXY (VIT D DEFICIENCY, FRACTURES): Vit D, 25-Hydroxy: 10 ng/mL — ABNORMAL LOW (ref 30.0–100.0)

## 2014-06-30 MED ORDER — ALPRAZOLAM 0.5 MG PO TABS: 0.5000 mg | ORAL_TABLET | Freq: Three times a day (TID) | ORAL | Status: DC | PRN

## 2014-06-30 MED ORDER — INSULIN ASPART 100 UNIT/ML ~~LOC~~ SOLN
0.0000 [IU] | Freq: Three times a day (TID) | SUBCUTANEOUS | Status: DC
  Administered 2014-06-30 – 2014-07-01 (×3): 2 [IU] via SUBCUTANEOUS

## 2014-06-30 MED ORDER — WHITE PETROLATUM GEL
Status: AC
  Administered 2014-06-30: 15:00:00
  Filled 2014-06-30: qty 1

## 2014-06-30 MED ORDER — INSULIN ASPART 100 UNIT/ML ~~LOC~~ SOLN: 0.0000 [IU] | Freq: Every day | SUBCUTANEOUS | Status: DC

## 2014-06-30 NOTE — Progress Notes (Addendum)
Pt transferred to 6E30 via wheelchair. Pt in bed resting comfortably with bed alarm on and call bell in reach. Pt oriented to staff and unit. Pt has wallet in pocket of gown that she refuses to take out. Will continue to monitor.   Tyna Jaksch, RN

## 2014-06-30 NOTE — Care Management Note (Signed)
Case Management Note  Patient Details  Name: [name] [name] MRN: [id number] Date of Birth: 05-14-1955  Subjective/Objective:      Pt admitted with ARF              Action/Plan:  PTA pt lived at home- NCM to follow for d/c needs   Expected Discharge Date:                  Expected Discharge Plan:  Home/Self Care  In-House Referral:     Discharge planning Services     Post Acute Care Choice:    Choice offered to:     DME Arranged:    DME Agency:     HH Arranged:    HH Agency:     Status of Service:  In process, will continue to follow  Medicare Important Message Given:    Date Medicare IM Given:    Medicare IM give by:    Date Additional Medicare IM Given:    Additional Medicare Important Message give by:     If discussed at [place] of Stay Meetings, dates discussed:    Additional Comments:  Dawayne Patricia, RN 06/30/2014, 11:58 AM

## 2014-06-30 NOTE — Progress Notes (Signed)
Poorly oriented No new complaints No distress  Filed Vitals:   06/30/14 1058 06/30/14 1100 06/30/14 1200 06/30/14 1300  BP:  131/56 133/70 98/42  Pulse:  105 107 110  Temp: 98.4 F (36.9 C)     TempSrc: Oral     Resp:  28 40 40  Height:      Weight:      SpO2:  100% 92% 93%   NAD HEENT WNL No JVD noted Chest without wheezes Reg, no M NABS, soft Ext warm, no edema MAEs  BMET    Component Value Date/Time   NA 149* 06/30/2014 0215   K 3.7 06/30/2014 0215   CL 116* 06/30/2014 0215   CO2 23 06/30/2014 0215   GLUCOSE 167* 06/30/2014 0215   BUN 84* 06/30/2014 0215   CREATININE 4.81* 06/30/2014 0215   CALCIUM 9.1 06/30/2014 0215   GFRNONAA 9* 06/30/2014 0215   GFRAA 10* 06/30/2014 0215    CBC    Component Value Date/Time   WBC 14.5* 06/30/2014 0215   RBC 4.19 06/30/2014 0215   HGB 10.8* 06/30/2014 0215   HCT 33.4* 06/30/2014 0215   PLT 337 06/30/2014 0215   MCV 79.7 06/30/2014 0215   MCH 25.8* 06/30/2014 0215   MCHC 32.3 06/30/2014 0215   RDW 13.5 06/30/2014 0215   LYMPHSABS 1.2 06/28/2014 1420   MONOABS 0.4 06/28/2014 1420   EOSABS 0.0 06/28/2014 1420   BASOSABS 0.0 06/28/2014 1420    No new CXR  IMPRESSION: AKI - Cr improving Hypernatremia Lactic acidosis - resolved Hyperkalemia - resolved Acute encephalopathy DM 2 UDS positive for cocaine  PLAN: Cont D5W Advance diet to CHO modified Change SSI to ACHS Advance activity Transfer to med-surg and TRH. Discussed with Dr Nicolette Bang, MD ; [facility] [phone].  After 5:30 PM or weekends, call [phone]

## 2014-06-30 NOTE — Progress Notes (Signed)
[facility] Progress Note   Subjective: UOP great at 3.5 liters- numbers all better- BUN under 100-  Urine drug screen +for cocaine.  Hypotension resolved, serum Na down to 149- still on d5.  Alert, tangential conversation- indicates that there is no one to help her at home- indicates someone in jail ?    Filed Vitals:   06/30/14 0435 06/30/14 0500 06/30/14 0600 06/30/14 0700  BP:  116/99 128/92 139/75  Pulse:   111   Temp: 98.2 F (36.8 C)     TempSrc: Oral     Resp:  30 32 23  Weight:  76.2 kg (167 lb 15.9 oz)    SpO2:   70%    Exam: Agitated , in restraints, difficult to understand, - knows hospital No jvd Chest clear bilat RRR no MRG Abd soft ntnd no ascites or hsm No leg or UE edema Neuro is confused, agitated   UA 5/7 5.0, 1.016, 3-6 rbc/wbc per hpf, 30 protein UPC ratio 0.71 UNa 80, UCr 105 Renal US 9.7/ 10 cm kidneys, no hydro  Summary: Adult female presented found disoriented by neighbors, no family / friends here for history.  Notes from previous admission remarked possible etoh abuse. Here now w AMS , severe azotemia, diarrhea and hyperkalemia. Was on ACEi,, nsaids and HCTZ for home meds amongst others. UDS +cocaine        Assessment: 1. Acute kidney failure - in pt with diarrheal illness, vol depletion/ hypotension/ ACEi / NSAID's/ diuretics. Appears to be improving, making good urine and numbers improving.  Would cont conservative management as this is likely an acute injury as opposed to chronic. Kidneys on [place] are of normal size for pt's small frame. Do not know baseline renal function- all possibly offending meds on hold 2. AMS - need family input for baseline MS- was on many MS affecting meds- now all on hold 3. Hyperkalemia - resolved 4. Hypernatremia - IVF"s changed to hypotonic fluids- improving 5. Vol depletion - better, BP's up to normal today 6. Diarrhea - significant although seems to be slowing down. Per primary 7. NIDDM w  neuropathy 8. Hx HTN holding meds     Nami Strawder A   06/30/2014, 7:11 AM     Recent Labs Lab 06/29/14 0750 06/29/14 1539 06/30/14 0215  NA 156* 154* 149*  K 5.1 4.3 3.7  CL 121* 121* 116*  CO2 16* 17* 23  GLUCOSE 97 175* 167*  BUN 121* 104* 84*  CREATININE 9.27* 7.39* 4.81*  CALCIUM 9.5 9.0 9.1    Recent Labs Lab 06/28/14 1420  AST 26  ALT 21  ALKPHOS 100  BILITOT 0.7  PROT 8.3*  ALBUMIN 4.5    Recent Labs Lab 06/28/14 1420 06/28/14 1432 06/29/14 0257 06/30/14 0215  WBC 8.1  --  16.2* 14.5*  NEUTROABS 6.5  --   --   --   HGB 12.8 15.3* 11.2* 10.8*  HCT 40.4 45.0 35.6* 33.4*  MCV 82.4  --  82.4 79.7  PLT 282  --  381 337   . antiseptic oral rinse  7 mL Mouth Rinse q12n4p  . chlorhexidine  15 mL Mouth Rinse BID  . Chlorhexidine Gluconate Cloth  6 each Topical Q0600  . heparin subcutaneous  5,000 Units Subcutaneous 3 times per day  . mupirocin ointment  1 application Nasal BID  . pantoprazole (PROTONIX) IV  40 mg Intravenous Q24H  . sodium polystyrene  60 g Rectal Once   . dextrose 150 mL/hr  at 06/29/14 1800   LORazepam, ondansetron

## 2014-06-30 NOTE — Progress Notes (Signed)
Utilization review completed.  

## 2014-07-01 DIAGNOSIS — F419 Anxiety disorder, unspecified: Secondary | ICD-10-CM | POA: Diagnosis present

## 2014-07-01 DIAGNOSIS — F329 Major depressive disorder, single episode, unspecified: Secondary | ICD-10-CM

## 2014-07-01 DIAGNOSIS — G9341 Metabolic encephalopathy: Secondary | ICD-10-CM

## 2014-07-01 DIAGNOSIS — E1165 Type 2 diabetes mellitus with hyperglycemia: Secondary | ICD-10-CM

## 2014-07-01 DIAGNOSIS — F411 Generalized anxiety disorder: Secondary | ICD-10-CM

## 2014-07-01 DIAGNOSIS — E119 Type 2 diabetes mellitus without complications: Secondary | ICD-10-CM | POA: Diagnosis present

## 2014-07-01 DIAGNOSIS — E875 Hyperkalemia: Secondary | ICD-10-CM

## 2014-07-01 DIAGNOSIS — E876 Hypokalemia: Secondary | ICD-10-CM

## 2014-07-01 DIAGNOSIS — N179 Acute kidney failure, unspecified: Principal | ICD-10-CM

## 2014-07-01 DIAGNOSIS — F32A Depression, unspecified: Secondary | ICD-10-CM | POA: Diagnosis present

## 2014-07-01 DIAGNOSIS — N183 Chronic kidney disease, stage 3 unspecified: Secondary | ICD-10-CM | POA: Diagnosis present

## 2014-07-01 DIAGNOSIS — E785 Hyperlipidemia, unspecified: Secondary | ICD-10-CM

## 2014-07-01 DIAGNOSIS — E1169 Type 2 diabetes mellitus with other specified complication: Secondary | ICD-10-CM | POA: Diagnosis present

## 2014-07-01 DIAGNOSIS — G9340 Encephalopathy, unspecified: Secondary | ICD-10-CM

## 2014-07-01 DIAGNOSIS — D72829 Elevated white blood cell count, unspecified: Secondary | ICD-10-CM

## 2014-07-01 LAB — CBC
HCT: 32.5 % — ABNORMAL LOW (ref 36.0–46.0)
HEMOGLOBIN: 10.5 g/dL — AB (ref 12.0–15.0)
MCH: 25.5 pg — ABNORMAL LOW (ref 26.0–34.0)
MCHC: 32.3 g/dL (ref 30.0–36.0)
MCV: 78.9 fL (ref 78.0–100.0)
PLATELETS: 316 10*3/uL (ref 150–400)
RBC: 4.12 MIL/uL (ref 3.87–5.11)
RDW: 13.6 % (ref 11.5–15.5)
WBC: 12.9 10*3/uL — AB (ref 4.0–10.5)

## 2014-07-01 LAB — UIFE/LIGHT CHAINS/TP QN, 24-HR UR
ALBUMIN, U: 57.6 %
ALPHA 1 UR: 1.8 %
ALPHA 2 UR: 12.8 %
Beta, Urine: 18.6 %
FREE KAPPA/LAMBDA RATIO: 5.32 (ref 2.04–10.37)
Free Lambda Lt Chains,Ur: 26.9 mg/L — ABNORMAL HIGH (ref 0.24–6.66)
Free Lt Chn Excr Rate: 143 mg/L — ABNORMAL HIGH (ref 1.35–24.19)
Gamma Globulin, Urine: 9.2 %
TOTAL PROTEIN, URINE-UPE24: 48.9 mg/dL — AB (ref 0.0–15.0)

## 2014-07-01 LAB — URINE CULTURE: Colony Count: >=100000

## 2014-07-01 LAB — RENAL FUNCTION PANEL
ANION GAP: 12 (ref 5–15)
Albumin: 2.8 g/dL — ABNORMAL LOW (ref 3.5–5.0)
BUN: 48 mg/dL — ABNORMAL HIGH (ref 6–20)
CHLORIDE: 109 mmol/L (ref 101–111)
CO2: 20 mmol/L — AB (ref 22–32)
Calcium: 8.4 mg/dL — ABNORMAL LOW (ref 8.9–10.3)
Creatinine, Ser: 2.37 mg/dL — ABNORMAL HIGH (ref 0.44–1.00)
GFR calc Af Amer: 24 mL/min — ABNORMAL LOW (ref >60)
GFR calc non Af Amer: 21 mL/min — ABNORMAL LOW (ref >60)
GLUCOSE: 105 mg/dL — AB (ref 70–99)
POTASSIUM: 3 mmol/L — AB (ref 3.5–5.1)
Phosphorus: 1.9 mg/dL — ABNORMAL LOW (ref 2.5–4.6)
SODIUM: 141 mmol/L (ref 135–145)

## 2014-07-01 LAB — LIPID PANEL
CHOL/HDL RATIO: 2.7 ratio
Cholesterol: 128 mg/dL (ref 0–200)
HDL: 47 mg/dL (ref >40)
LDL Cholesterol: 42 mg/dL (ref 0–99)
Triglycerides: 194 mg/dL — ABNORMAL HIGH (ref <150)
VLDL: 39 mg/dL (ref 0–40)

## 2014-07-01 LAB — GLUCOSE, CAPILLARY
GLUCOSE-CAPILLARY: 110 mg/dL — AB (ref 70–99)
GLUCOSE-CAPILLARY: 133 mg/dL — AB (ref 70–99)
Glucose-Capillary: 132 mg/dL — ABNORMAL HIGH (ref 70–99)
Glucose-Capillary: 140 mg/dL — ABNORMAL HIGH (ref 70–99)

## 2014-07-01 MED ORDER — POTASSIUM CHLORIDE CRYS ER 20 MEQ PO TBCR
40.0000 meq | EXTENDED_RELEASE_TABLET | Freq: Once | ORAL | Status: AC
  Administered 2014-07-01: 40 meq via ORAL
  Filled 2014-07-01: qty 2

## 2014-07-01 MED ORDER — POTASSIUM CHLORIDE CRYS ER 20 MEQ PO TBCR
40.0000 meq | EXTENDED_RELEASE_TABLET | Freq: Two times a day (BID) | ORAL | Status: AC
  Administered 2014-07-01: 40 meq via ORAL
  Filled 2014-07-01: qty 2

## 2014-07-01 MED ORDER — LORAZEPAM 0.5 MG PO TABS
0.5000 mg | ORAL_TABLET | Freq: Three times a day (TID) | ORAL | Status: DC | PRN
  Administered 2014-07-02: 1 mg via ORAL
  Filled 2014-07-01: qty 2

## 2014-07-01 NOTE — Progress Notes (Signed)
[place] TEAM 1 - Stepdown/ICU TEAM Progress Note  SOLEI HEMMERLING [id number] DOB: 1955/11/09 DOA: 06/28/2014 PCP: Pcp Not In System  Admit HPI / Brief Narrative: 59 yo BF PMHx DM type 2, ,diabetic neuropathy,HTN, HLD substance abuse,  Found by a neighbor in her yard, stumbling , tumbling and incoherent. Transported to [facility] ED and initial blood work revealed K+ 8.4 and creatine of 12.5. She remains in a stunned condition, able to follow commands but speech is not understandable. Of note she is on an ACE-I and metformin as an outpatient. PCCM asked to admit. We will hydrate, repeat lab work to ensure accuracy, treat as needed and admit to ICU. Most likely she will need renal consult and possible short term dialysis. Renal US will be ordered to rule out blockage. Metformin and ACE-I will be discontinued.  HPI/Subjective: 5/10 A/O 3 (does not know why she was admitted). States drinks approximately 20 ounces per week of alcohol, does not recall when she last used cocaine. States has had anxiety and depression for approximately 20 years. Only medication she can remember that was prescribed for anxiety was Xanax. Cannot recall if she was on any medication in the past for depression. States she feels very depressed today does not have a concrete reason.  Assessment/Plan: HTN,  -controlled without medication  Acute kidney failure  -Unsure baseline  -Improving .  Gap/non gap metabolic acidosis. -Resolved however at the top end of normal. Monitor closely -Check labs in the a.m.  Hyperkalemia/Hypokalemia - K-Dur 40 mEq 1 -Check magnesium in a.m.  Leukocytosis. -Hepatitis panel/HIV panel negative -Patient afebrile, will obtain CBC with differential in the a.m. continue to hold antibiotic  -Reactive secondary to cocaine use?  DM Type 2. -Hemoglobin A1c pending -Lipid panel pending  -Moderate SSI  -Hold outpt metformin, glucotorol  HLD. -Lipid panel pending  Acute metabolic  encephalopathy  -Most likely multifactorial include UDS positive for cocaine, benzodiazepines, and acute renal failure. -PRN ativan q8 hr anxiety -Hold outpt xanax, flexeril, neurotin, percocet, ambien  Depression/anxiety -Since patient claims that SSRI/SSNI cause paradoxical side effects of consult psychiatry -Patient requesting Xanax and narcotics are what she uses in the past   Code Status: FULL Family Communication: no family present at time of exam Disposition Plan: Following psychiatry consult    Consultants: Psychiatry pending  Procedure/Significant Events: 5/7 renal US>> cyst Lt kidney   Culture BCx2 5/7>> UC 5/7>>   Antibiotics: NA  DVT prophylaxis: Heparin subcutaneous   Devices    LINES / TUBES:      Continuous Infusions:    Objective: VITAL SIGNS: Temp: 98.6 F (37 C) (05/10 1735) Temp Source: Oral (05/10 1735) BP: 109/62 mmHg (05/10 1735) Pulse Rate: 98 (05/10 1735) SPO2; FIO2:   Intake/Output Summary (Last 24 hours) at 07/01/14 1913 Last data filed at 07/01/14 1700  Gross per 24 hour  Intake    480 ml  Output    800 ml  Net   -320 ml     Exam: General: A/O 3, (does not know why), NAD No acute respiratory distress Lungs: Clear to auscultation bilaterally without wheezes or crackles Cardiovascular: Regular rate and rhythm without murmur gallop or rub normal S1 and S2 Abdomen: Nontender, nondistended, soft, bowel sounds positive, no rebound, no ascites, no appreciable mass Extremities: No significant cyanosis, clubbing, or edema bilateral lower extremities  Data Reviewed: Basic Metabolic Panel:  Recent Labs Lab 06/29/14 0257 06/29/14 0750 06/29/14 1539 06/30/14 0215 07/01/14 0534  NA 151* 156* 154* 149*  141  K 6.2* 5.1 4.3 3.7 3.0*  CL 116* 121* 121* 116* 109  CO2 12* 16* 17* 23 20*  GLUCOSE 97 97 175* 167* 105*  BUN 125* 121* 104* 84* 48*  CREATININE 9.90* 9.27* 7.39* 4.81* 2.37*  CALCIUM 9.4 9.5 9.0 9.1 8.4*    PHOS  --   --   --   --  1.9*   Liver Function Tests:  Recent Labs Lab 06/28/14 1420 07/01/14 0534  AST 26  --   ALT 21  --   ALKPHOS 100  --   BILITOT 0.7  --   PROT 8.3*  --   ALBUMIN 4.5 2.8*   No results for input(s): LIPASE, AMYLASE in the last 168 hours. No results for input(s): AMMONIA in the last 168 hours. CBC:  Recent Labs Lab 06/28/14 1420 06/28/14 1432 06/29/14 0257 06/30/14 0215 07/01/14 0534  WBC 8.1  --  16.2* 14.5* 12.9*  NEUTROABS 6.5  --   --   --   --   HGB 12.8 15.3* 11.2* 10.8* 10.5*  HCT 40.4 45.0 35.6* 33.4* 32.5*  MCV 82.4  --  82.4 79.7 78.9  PLT 282  --  381 337 316   Cardiac Enzymes:  Recent Labs Lab 06/28/14 1620  CKTOTAL 123   BNP (last 3 results) No results for input(s): BNP in the last 8760 hours.  ProBNP (last 3 results) No results for input(s): PROBNP in the last 8760 hours.  CBG:  Recent Labs Lab 06/30/14 1621 06/30/14 2209 07/01/14 0800 07/01/14 1220 07/01/14 1636  GLUCAP 142* 144* 132* 110* 140*    Recent Results (from the past 240 hour(s))  Culture, blood (routine x 2)     Status: None (Preliminary result)   Collection Time: 06/28/14  2:13 PM  Result Value Ref Range Status   Specimen Description BLOOD RIGHT ARM  Final   Special Requests BOTTLES DRAWN AEROBIC AND ANAEROBIC 5CC  Final   Culture   Final           BLOOD CULTURE RECEIVED NO GROWTH TO DATE CULTURE WILL BE HELD FOR 5 DAYS BEFORE ISSUING A FINAL NEGATIVE REPORT Performed at [facility]    Report Status PENDING  Incomplete  Culture, blood (routine x 2)     Status: None (Preliminary result)   Collection Time: 06/28/14  2:17 PM  Result Value Ref Range Status   Specimen Description BLOOD RIGHT ARM  Final   Special Requests BOTTLES DRAWN AEROBIC AND ANAEROBIC 5CC  Final   Culture   Final           BLOOD CULTURE RECEIVED NO GROWTH TO DATE CULTURE WILL BE HELD FOR 5 DAYS BEFORE ISSUING A FINAL NEGATIVE REPORT Performed at [facility]    Report Status PENDING  Incomplete  MRSA PCR Screening     Status: Abnormal   Collection Time: 06/28/14  4:53 PM  Result Value Ref Range Status   MRSA by PCR POSITIVE (A) NEGATIVE Final    Comment:        The GeneXpert MRSA Assay (FDA approved for NASAL specimens only), is one component of a comprehensive MRSA colonization surveillance program. It is not intended to diagnose MRSA infection nor to guide or monitor treatment for MRSA infections. RESULT CALLED TO, READ BACK BY AND VERIFIED WITH: G.SANTANELA,RN 06/28/14 @2122  BY V.WILKINS   Urine culture     Status: None   Collection Time: 06/28/14 11:13 PM  Result Value Ref Range Status  Specimen Description URINE, CATHETERIZED  Final   Special Requests NONE  Final   Colony Count   Final    >=100,000 COLONIES/ML Performed at [facility]    Culture   Final    ESCHERICHIA COLI Performed at [facility]    Report Status 07/01/2014 FINAL  Final   Organism ID, Bacteria ESCHERICHIA COLI  Final      Susceptibility   Escherichia coli - MIC*    AMPICILLIN <=2 SENSITIVE Sensitive     CEFAZOLIN <=4 SENSITIVE Sensitive     CEFTRIAXONE <=1 SENSITIVE Sensitive     CIPROFLOXACIN <=0.25 SENSITIVE Sensitive     GENTAMICIN <=1 SENSITIVE Sensitive     LEVOFLOXACIN <=0.12 SENSITIVE Sensitive     NITROFURANTOIN <=16 SENSITIVE Sensitive     TOBRAMYCIN <=1 SENSITIVE Sensitive     TRIMETH/SULFA <=20 SENSITIVE Sensitive     PIP/TAZO <=4 SENSITIVE Sensitive     * ESCHERICHIA COLI     Studies:  Recent x-ray studies have been reviewed in detail by the Attending Physician  Scheduled Meds:  Scheduled Meds: . antiseptic oral rinse  7 mL Mouth Rinse q12n4p  . chlorhexidine  15 mL Mouth Rinse BID  . Chlorhexidine Gluconate Cloth  6 each Topical Q0600  . heparin subcutaneous  5,000 Units Subcutaneous 3 times per day  . insulin aspart  0-15 Units Subcutaneous TID WC  . insulin aspart  0-5 Units Subcutaneous QHS   . mupirocin ointment  1 application Nasal BID  . potassium chloride  40 mEq Oral BID    Time spent on care of this patient: 40 mins   Rayce Brahmbhatt, Geraldo Docker , MD  [facility] Office  [phone] Pager - [phone]  On-Call/Text Page:      [email or web address]      password [email or web address]  If 7PM-7AM, please contact night-coverage [email or web address] Password [email or web address] 07/01/2014, 7:13 PM   LOS: 3 days   Care during the described time interval was provided by me .  I have reviewed this patient's available data, including medical history, events of note, physical examination, radiology studies and test results as part of my evaluation  Dia Crawford, MD [phone] Pager

## 2014-07-01 NOTE — Progress Notes (Signed)
[facility] Progress Note   Subjective: UOP missed - probably in transfer to 6 E-  All numbers better.   Alert, tangential conversation- indicates that there is no one to help her at home- indicates someone in jail ?    Filed Vitals:   06/30/14 1800 06/30/14 1859 06/30/14 2100 07/01/14 0500  BP: 104/80  113/76 103/55  Pulse: 73  88 102  Temp:   98.6 F (37 C) 98.7 F (37.1 C)  TempSrc:   Oral Oral  Resp: 17  22 20   Height:      Weight:   75 kg (165 lb 5.5 oz)   SpO2: 94% 95% 97% 96%   Exam: Agitated , in restraints, difficult to understand, - knows hospital No jvd Chest clear bilat RRR no MRG Abd soft ntnd no ascites or hsm No leg or UE edema Neuro is confused, agitated   UA 5/7 5.0, 1.016, 3-6 rbc/wbc per hpf, 30 protein UPC ratio 0.71 UNa 80, UCr 105 Renal US 9.7/ 10 cm kidneys, no hydro  Summary: 59 year old female presented found disoriented by neighbors, no family / friends here for history.  Notes from previous admission remarked possible etoh abuse. Here now w AMS , severe azotemia, diarrhea and hyperkalemia. Was on ACEi,, nsaids and HCTZ for home meds amongst others. UDS +cocaine        Assessment: 1. Acute kidney failure - in pt with diarrheal illness, vol depletion/ hypotension/ ACEi / NSAID's/ diuretics.making good urine and numbers improving remarkably.  Seems consistent with an acute injury as opposed to chronic. Kidneys on [place] are of normal size for pt's small frame. all possibly offending meds on hold 2. AMS - need family input for baseline MS- was on many MS affecting meds- now all on hold- better every day  3. Hyperkalemia - resolved- now hypokalemic in need of repletion 4. Hypernatremia - I resolved , will stop IVF  5. Vol depletion - better, BP's up to normal today 6. Diarrhea - significant although seems to be slowing down. Per primary 7. NIDDM w neuropathy 8. Hx HTN holding meds 9. Renal will sign off- anticipate continued recovery-  call with questions     Darick Fetters A   07/01/2014, 7:47 AM     Recent Labs Lab 06/29/14 1539 06/30/14 0215 07/01/14 0534  NA 154* 149* 141  K 4.3 3.7 3.0*  CL 121* 116* 109  CO2 17* 23 20*  GLUCOSE 175* 167* 105*  BUN 104* 84* 48*  CREATININE 7.39* 4.81* 2.37*  CALCIUM 9.0 9.1 8.4*  PHOS  --   --  1.9*    Recent Labs Lab 06/28/14 1420 07/01/14 0534  AST 26  --   ALT 21  --   ALKPHOS 100  --   BILITOT 0.7  --   PROT 8.3*  --   ALBUMIN 4.5 2.8*    Recent Labs Lab 06/28/14 1420  06/29/14 0257 06/30/14 0215 07/01/14 0534  WBC 8.1  --  16.2* 14.5* 12.9*  NEUTROABS 6.5  --   --   --   --   HGB 12.8  < > 11.2* 10.8* 10.5*  HCT 40.4  < > 35.6* 33.4* 32.5*  MCV 82.4  --  82.4 79.7 78.9  PLT 282  --  381 337 316  < > = values in this interval not displayed. Marland Kitchen antiseptic oral rinse  7 mL Mouth Rinse q12n4p  . chlorhexidine  15 mL Mouth Rinse BID  . Chlorhexidine Gluconate  Cloth  6 each Topical Q0600  . heparin subcutaneous  5,000 Units Subcutaneous 3 times per day  . insulin aspart  0-15 Units Subcutaneous TID WC  . insulin aspart  0-5 Units Subcutaneous QHS  . mupirocin ointment  1 application Nasal BID   . dextrose 100 mL/hr at 07/01/14 0119   ALPRAZolam, ondansetron

## 2014-07-02 DIAGNOSIS — F14120 Cocaine abuse with intoxication, uncomplicated: Secondary | ICD-10-CM

## 2014-07-02 DIAGNOSIS — F1310 Sedative, hypnotic or anxiolytic abuse, uncomplicated: Secondary | ICD-10-CM

## 2014-07-02 DIAGNOSIS — F4310 Post-traumatic stress disorder, unspecified: Secondary | ICD-10-CM

## 2014-07-02 DIAGNOSIS — F1994 Other psychoactive substance use, unspecified with psychoactive substance-induced mood disorder: Secondary | ICD-10-CM

## 2014-07-02 LAB — CBC WITH DIFFERENTIAL/PLATELET
BASOS ABS: 0 10*3/uL (ref 0.0–0.1)
BASOS PCT: 0 % (ref 0–1)
EOS ABS: 0.2 10*3/uL (ref 0.0–0.7)
Eosinophils Relative: 2 % (ref 0–5)
HEMATOCRIT: 31.2 % — AB (ref 36.0–46.0)
Hemoglobin: 10.1 g/dL — ABNORMAL LOW (ref 12.0–15.0)
LYMPHS ABS: 3.8 10*3/uL (ref 0.7–4.0)
Lymphocytes Relative: 36 % (ref 12–46)
MCH: 25.4 pg — AB (ref 26.0–34.0)
MCHC: 32.4 g/dL (ref 30.0–36.0)
MCV: 78.4 fL (ref 78.0–100.0)
Monocytes Absolute: 1 10*3/uL (ref 0.1–1.0)
Monocytes Relative: 9 % (ref 3–12)
NEUTROS PCT: 53 % (ref 43–77)
Neutro Abs: 5.5 10*3/uL (ref 1.7–7.7)
PLATELETS: 342 10*3/uL (ref 150–400)
RBC: 3.98 MIL/uL (ref 3.87–5.11)
RDW: 13.4 % (ref 11.5–15.5)
WBC: 10.5 10*3/uL (ref 4.0–10.5)

## 2014-07-02 LAB — MAGNESIUM: Magnesium: 1.1 mg/dL — ABNORMAL LOW (ref 1.7–2.4)

## 2014-07-02 LAB — COMPREHENSIVE METABOLIC PANEL
ALT: 19 U/L (ref 14–54)
AST: 20 U/L (ref 15–41)
Albumin: 2.8 g/dL — ABNORMAL LOW (ref 3.5–5.0)
Alkaline Phosphatase: 77 U/L (ref 38–126)
Anion gap: 10 (ref 5–15)
BUN: 29 mg/dL — AB (ref 6–20)
CALCIUM: 8.2 mg/dL — AB (ref 8.9–10.3)
CO2: 22 mmol/L (ref 22–32)
Chloride: 112 mmol/L — ABNORMAL HIGH (ref 101–111)
Creatinine, Ser: 1.69 mg/dL — ABNORMAL HIGH (ref 0.44–1.00)
GFR calc Af Amer: 37 mL/min — ABNORMAL LOW (ref >60)
GFR calc non Af Amer: 32 mL/min — ABNORMAL LOW (ref >60)
GLUCOSE: 99 mg/dL (ref 70–99)
Potassium: 4 mmol/L (ref 3.5–5.1)
SODIUM: 144 mmol/L (ref 135–145)
TOTAL PROTEIN: 5.5 g/dL — AB (ref 6.5–8.1)
Total Bilirubin: 0.2 mg/dL — ABNORMAL LOW (ref 0.3–1.2)

## 2014-07-02 LAB — GLUCOSE, CAPILLARY
Glucose-Capillary: 107 mg/dL — ABNORMAL HIGH (ref 70–99)
Glucose-Capillary: 118 mg/dL — ABNORMAL HIGH (ref 70–99)

## 2014-07-02 MED ORDER — GABAPENTIN 400 MG PO CAPS: 400.0000 mg | ORAL_CAPSULE | Freq: Two times a day (BID) | ORAL | Status: DC

## 2014-07-02 NOTE — Consult Note (Signed)
[facility] Face-to-Face Psychiatry Consult   Reason for Consult:  Cocaine and benzo abuse Referring Physician:  Dr. Woods Patient Identification: [name] [name] MRN:  [id number] Principal Diagnosis: Substance induced mood disorder Diagnosis:   Patient Active Problem List   Diagnosis Date Noted  . Substance induced mood disorder [F19.94] 07/02/2014  . Acute renal failure syndrome [N17.9]   . Encephalopathy [G93.40]   . Hyperkalemia [E87.5]   . Hypokalemia [E87.6]   . Leukocytosis [D72.829]   . Diabetes type 2, uncontrolled [E11.65]   . HLD (hyperlipidemia) [E78.5]   . Metabolic encephalopathy [G93.41]   . Depression [F32.9]   . Anxiety state [F41.1]   . Acute renal failure [N17.9] 06/28/2014    Total Time spent with patient: 45 minutes  Subjective:   [name] [name] is a 59 y.o. female patient admitted with substance abuse and ARF.  HPI:  [name] [name] is a 59 years old female admitted to [facility] for abnormal electrolytes, acute renal failure and neighbors found her in yard stumbling and tumbling and incoherent. Patient reportedly noncompliant with her medication management and has been abusing drugs like cocaine and Xanax. Patient is awake, alert and oriented to time place person and situation, calm, cooperative during this evaluation. Patient is somewhat guarded saying she does not want to talk about her past traumas and injuries but willing to participate treatment in the hospital.  Patient reported she was diagnosed with posttraumatic stress disorder and supposed to [facility] [place], [place] but has been noncompliant with her treatment. Patient   that she has no transportation. Patient son and daughter visiting her in the hospital reported that she has been living on her own home, strong-willed and sometimes stubborn. Reportedly she has 3 different auto mother is with her. Patient reported making 3 friends from 15  years ago in her past and also using drugs. Patient also concerned about loss of her family members more than a year ago. Patient stated she is fine at this time and denies current symptoms of depression, anxiety, posttraumatic stress disorder, mania and psychosis. Patient contract for safety and denying's no suicidal or homicidal ideation, intention or plans.  HPI Elements:   Location:  Depression and anxiety. Quality:  Decreased secondary to substance abuse. Severity:  Noncompliant with the treatment. Timing:  Making friends from the past. Duration:  Few weeks. Context:  Unknown psychosocial stressors..  Past Medical History:  Past Medical History  Diagnosis Date  . Diabetes mellitus without complication   . Neuropathy in diabetes   . Knee pain, chronic   . Hypertension    History reviewed. No pertinent past surgical history. Family History: History reviewed. No pertinent family history. Social History:  History  Alcohol Use  . Yes     History  Drug Use  . Yes  . Special: Marijuana    History   Social History  . Marital Status: Single    Spouse Name: N/A  . Number of Children: N/A  . Years of Education: N/A   Social History Main Topics  . Smoking status: Never Smoker   . Smokeless tobacco: Not on file  . Alcohol Use: Yes  . Drug Use: Yes    Special: Marijuana  . Sexual Activity: Not on file   Other Topics Concern  . None   Social History Narrative   Additional Social History:                            Allergies:   Allergies  Allergen Reactions  . Morphine And Related Itching  . Penicillins Itching and Nausea And Vomiting    Labs:  Results for orders placed or performed during the hospital encounter of 06/28/14 (from the past 48 hour(s))  Glucose, capillary     Status: Abnormal   Collection Time: 06/30/14 11:00 AM  Result Value Ref Range   Glucose-Capillary 113 (H) 70 - 99 mg/dL  Glucose, capillary     Status: Abnormal   Collection Time:  06/30/14  4:21 PM  Result Value Ref Range   Glucose-Capillary 142 (H) 70 - 99 mg/dL  Glucose, capillary     Status: Abnormal   Collection Time: 06/30/14 10:09 PM  Result Value Ref Range   Glucose-Capillary 144 (H) 70 - 99 mg/dL  Renal function panel     Status: Abnormal   Collection Time: 07/01/14  5:34 AM  Result Value Ref Range   Sodium 141 135 - 145 mmol/L   Potassium 3.0 (L) 3.5 - 5.1 mmol/L   Chloride 109 101 - 111 mmol/L   CO2 20 (L) 22 - 32 mmol/L   Glucose, Bld 105 (H) 70 - 99 mg/dL   BUN 48 (H) 6 - 20 mg/dL    Comment: RESULT REPEATED AND VERIFIED   Creatinine, Ser 2.37 (H) 0.44 - 1.00 mg/dL    Comment: RESULT REPEATED AND VERIFIED   Calcium 8.4 (L) 8.9 - 10.3 mg/dL   Phosphorus 1.9 (L) 2.5 - 4.6 mg/dL   Albumin 2.8 (L) 3.5 - 5.0 g/dL   GFR calc non Af Amer 21 (L) >60 mL/min   GFR calc Af Amer 24 (L) >60 mL/min    Comment: (NOTE) The eGFR has been calculated using the CKD EPI equation. This calculation has not been validated in all clinical situations. eGFR's persistently <60 mL/min signify possible Chronic Kidney Disease.    Anion gap 12 5 - 15  CBC     Status: Abnormal   Collection Time: 07/01/14  5:34 AM  Result Value Ref Range   WBC 12.9 (H) 4.0 - 10.5 K/uL   RBC 4.12 3.87 - 5.11 MIL/uL   Hemoglobin 10.5 (L) 12.0 - 15.0 g/dL   HCT 32.5 (L) 36.0 - 46.0 %   MCV 78.9 78.0 - 100.0 fL   MCH 25.5 (L) 26.0 - 34.0 pg   MCHC 32.3 30.0 - 36.0 g/dL   RDW 13.6 11.5 - 15.5 %   Platelets 316 150 - 400 K/uL  Lipid panel     Status: Abnormal   Collection Time: 07/01/14  5:34 AM  Result Value Ref Range   Cholesterol 128 0 - 200 mg/dL   Triglycerides 194 (H) <150 mg/dL   HDL 47 >40 mg/dL   Total CHOL/HDL Ratio 2.7 RATIO   VLDL 39 0 - 40 mg/dL   LDL Cholesterol 42 0 - 99 mg/dL    Comment:        Total Cholesterol/HDL:CHD Risk Coronary Heart Disease Risk Table                     Men   Women  1/2 Average Risk   3.4   3.3  Average Risk       5.0   4.4  2 X Average  Risk   9.6   7.1  3 X Average Risk  23.4   11.0        Use the calculated Patient Ratio above and the CHD Risk Table to determine   the patient's CHD Risk.        ATP III CLASSIFICATION (LDL):  <100     mg/dL   Optimal  100-129  mg/dL   Near or Above                    Optimal  130-159  mg/dL   Borderline  160-189  mg/dL   High  >190     mg/dL   Very High   Glucose, capillary     Status: Abnormal   Collection Time: 07/01/14  8:00 AM  Result Value Ref Range   Glucose-Capillary 132 (H) 70 - 99 mg/dL  Glucose, capillary     Status: Abnormal   Collection Time: 07/01/14 12:20 PM  Result Value Ref Range   Glucose-Capillary 110 (H) 70 - 99 mg/dL  Glucose, capillary     Status: Abnormal   Collection Time: 07/01/14  4:36 PM  Result Value Ref Range   Glucose-Capillary 140 (H) 70 - 99 mg/dL  Glucose, capillary     Status: Abnormal   Collection Time: 07/01/14  9:26 PM  Result Value Ref Range   Glucose-Capillary 133 (H) 70 - 99 mg/dL  Comprehensive metabolic panel     Status: Abnormal   Collection Time: 07/02/14  3:36 AM  Result Value Ref Range   Sodium 144 135 - 145 mmol/L   Potassium 4.0 3.5 - 5.1 mmol/L   Chloride 112 (H) 101 - 111 mmol/L   CO2 22 22 - 32 mmol/L   Glucose, Bld 99 70 - 99 mg/dL   BUN 29 (H) 6 - 20 mg/dL   Creatinine, Ser 1.69 (H) 0.44 - 1.00 mg/dL   Calcium 8.2 (L) 8.9 - 10.3 mg/dL   Total Protein 5.5 (L) 6.5 - 8.1 g/dL   Albumin 2.8 (L) 3.5 - 5.0 g/dL   AST 20 15 - 41 U/L   ALT 19 14 - 54 U/L   Alkaline Phosphatase 77 38 - 126 U/L   Total Bilirubin 0.2 (L) 0.3 - 1.2 mg/dL   GFR calc non Af Amer 32 (L) >60 mL/min   GFR calc Af Amer 37 (L) >60 mL/min    Comment: (NOTE) The eGFR has been calculated using the CKD EPI equation. This calculation has not been validated in all clinical situations. eGFR's persistently <60 mL/min signify possible Chronic Kidney Disease.    Anion gap 10 5 - 15  CBC with Differential/Platelet     Status: Abnormal   Collection Time:  07/02/14  3:36 AM  Result Value Ref Range   WBC 10.5 4.0 - 10.5 K/uL   RBC 3.98 3.87 - 5.11 MIL/uL   Hemoglobin 10.1 (L) 12.0 - 15.0 g/dL   HCT 31.2 (L) 36.0 - 46.0 %   MCV 78.4 78.0 - 100.0 fL   MCH 25.4 (L) 26.0 - 34.0 pg   MCHC 32.4 30.0 - 36.0 g/dL   RDW 13.4 11.5 - 15.5 %   Platelets 342 150 - 400 K/uL   Neutrophils Relative % 53 43 - 77 %   Neutro Abs 5.5 1.7 - 7.7 K/uL   Lymphocytes Relative 36 12 - 46 %   Lymphs Abs 3.8 0.7 - 4.0 K/uL   Monocytes Relative 9 3 - 12 %   Monocytes Absolute 1.0 0.1 - 1.0 K/uL   Eosinophils Relative 2 0 - 5 %   Eosinophils Absolute 0.2 0.0 - 0.7 K/uL   Basophils Relative 0 0 - 1 %   Basophils Absolute 0.0  0.0 - 0.1 K/uL  Magnesium     Status: Abnormal   Collection Time: 07/02/14  3:36 AM  Result Value Ref Range   Magnesium 1.1 (L) 1.7 - 2.4 mg/dL  Glucose, capillary     Status: Abnormal   Collection Time: 07/02/14  8:22 AM  Result Value Ref Range   Glucose-Capillary 107 (H) 70 - 99 mg/dL    Vitals: Blood pressure 127/5, pulse 91, temperature 97.7 F (36.5 C), temperature source Oral, resp. rate 20, height 5' 2" (1.575 m), weight 77.338 kg (170 lb 8 oz), SpO2 96 %.  Risk to Self: Is patient at risk for suicide?: No Risk to Others:   Prior Inpatient Therapy:   Prior Outpatient Therapy:    Current Facility-Administered Medications  Medication Dose Route Frequency Provider Last Rate Last Dose  . antiseptic oral rinse (CPC / CETYLPYRIDINIUM CHLORIDE 0.05%) solution 7 mL  7 mL Mouth Rinse q12n4p Vineet Sood, MD   7 mL at 06/30/14 1600  . chlorhexidine (PERIDEX) 0.12 % solution 15 mL  15 mL Mouth Rinse BID Vineet Sood, MD   15 mL at 07/01/14 0824  . Chlorhexidine Gluconate Cloth 2 % PADS 6 each  6 each Topical Q0600 Vineet Sood, MD   6 each at 07/02/14 0545  . heparin injection 5,000 Units  5,000 Units Subcutaneous 3 times per day Vineet Sood, MD   5,000 Units at 07/02/14 0545  . insulin aspart (novoLOG) injection 0-15 Units  0-15 Units  Subcutaneous TID WC David B Simonds, MD   2 Units at 07/01/14 1842  . insulin aspart (novoLOG) injection 0-5 Units  0-5 Units Subcutaneous QHS David B Simonds, MD   0 Units at 06/30/14 2200  . LORazepam (ATIVAN) tablet 0.5-1 mg  0.5-1 mg Oral Q8H PRN Curtis J Woods, MD   1 mg at 07/02/14 1007  . mupirocin ointment (BACTROBAN) 2 % 1 application  1 application Nasal BID Vineet Sood, MD   1 application at 07/02/14 0956  . ondansetron (ZOFRAN) injection 4-8 mg  4-8 mg Intravenous Q6H PRN Robert Schertz, MD   4 mg at 06/28/14 1539    Musculoskeletal: Strength & Muscle Tone: within normal limits Gait & Station: normal Patient leans: N/A  Psychiatric Specialty Exam: Physical Exam as per history and physical   ROS patient endorses depression, anxiety, guarded, substance abuse and negative for comprehensive review of systems except history of present illness   Blood pressure 127/5, pulse 91, temperature 97.7 F (36.5 C), temperature source Oral, resp. rate 20, height 5' 2" (1.575 m), weight 77.338 kg (170 lb 8 oz), SpO2 96 %.Body mass index is 31.18 kg/(m^2).  General Appearance: Guarded  Eye Contact::  Good  Speech:  Clear and Coherent  Volume:  Normal  Mood:  Depressed  Affect:  Appropriate and Congruent  Thought Process:  Coherent and Goal Directed  Orientation:  Full (Time, Place, and Person)  Thought Content:  WDL  Suicidal Thoughts:  No  Homicidal Thoughts:  No  Memory:  Immediate;   Fair Recent;   Fair  Judgement:  Fair  Insight:  Fair  Psychomotor Activity:  Normal  Concentration:  Good  Recall:  Good  Fund of Knowledge:Good  Language: Good  Akathisia:  Negative  Handed:  Right  AIMS (if indicated):     Assets:  Communication Skills Desire for Improvement Financial Resources/Insurance Housing Intimacy Leisure Time Resilience Social Support Talents/Skills Transportation  ADL's:  Intact  Cognition: WNL  Sleep:        Medical Decision Making: Review of Psycho-Social  Stressors (1), Review or order clinical lab tests (1), New Problem, with no additional work-up planned (3), Review or order medicine tests (1), Review of Medication Regimen & Side Effects (2) and Review of New Medication or Change in Dosage (2)  Treatment Plan Summary: Daily contact with patient to assess and evaluate symptoms and progress in treatment and Medication management  Plan:  Patient presented with altered mental status secondary to substance abuse and history of posttraumatic stress disorder. Patient urine drug screen is positive for cocaine and benzodiazepines. Patient has no withdrawal symptoms at this time and been sober from intoxication and contract for safety.  Posttraumatic stress disorder: Consider SSRI that Paxil 20 mg daily and Klonopin 0.5 mg twice daily until she receives outpatient medication management  Patient does not meet criteria for psychiatric inpatient admission. Supportive therapy provided about ongoing stressors.   Appreciate psychiatric consultation and will sign off at this time Please contact [phone] or [phone] if needs further assistance   Disposition: Patient will be referred to the outpatient psychiatric services at [facility] and medically stable  Seana Underwood,JANARDHAHA R. 07/02/2014 10:24 AM

## 2014-07-02 NOTE — Care Management Note (Signed)
Case Management Note  Patient Details  Name: [name] [name] MRN: [id number] Date of Birth: 1955-07-03  Subjective/Objective:     CM following for progression and d/c planning.               Action/Plan: 07/02/2014 Met with pt and son, whom pt plans to live with post d/c. Pt states that she uses a cane at home and needs a walker, this was ordered. Await possible HH orders.    Expected Discharge Date:      07/03/2014            Expected Discharge Plan:  [place]  In-House Referral:  Clinical Social Work  Discharge planning Services  NA  Post Acute Care Choice:  NA Choice offered to:  Patient  DME Arranged:  Walker rolling DME Agency:  [place]:    [facility] Agency:     Status of Service:  In process, will continue to follow  Medicare Important Message Given:   Date Medicare IM Given:    Medicare IM give by:    Date Additional Medicare IM Given:    Additional Medicare Important Message give by:     If discussed at [place] of Stay Meetings, dates discussed:    Additional Comments:  Adron Bene, RN 07/02/2014, 10:43 AM

## 2014-07-02 NOTE — Procedures (Signed)
Pt placed on hospital BiPAP machine.  Auto mode used with Max-20, min-5, and sterile water used for humidity.   Pt resting comfortably.  RT will continue to monitor the pt.

## 2014-07-02 NOTE — Discharge Summary (Signed)
Physician Discharge Summary  [name] [name] [id number] DOB: 03/24/1955 DOA: 06/28/2014  PCP: Pcp Not In System  Admit date: 06/28/2014 Discharge date: 07/02/2014  Time spent: 35 minutes  Recommendations for Outpatient Follow-up:  1. Patient will need retinopathy screening as well as formal ophthalmological exam as an outpatient 2. Patient will not be discharged on any controlled substances but can be discharged home for further management 3. Please repeat basic metabolic panel in about one week with PCP 4. Patient suffers from PTSD and may need referral as an outpatient to someone within the [place] system 5. Patient has been counseled regarding substance abuse and will need to quit cocaine 6.  Rolling walker ordered for patient  Discharge Diagnoses:  59 yo BF PMHx DM type 2, ,diabetic neuropathy,HTN, HLD substance abuse,  Found by a neighbor in her yard on 5/7, stumbling , tumbling and incoherent. Transported to [facility] ED and initial blood work revealed  K+ 8.4 and creatine of 12.5. Was on an ACE-I and metformin, ibuprofen PTA. Hypotensive on admission along with lactic acidosis PCCM admitted the patient and patient was aggressively volume repleted-patient was given insulin/calcium for hyperkalemia, Kayexalate enema She was hydrated, , nephrology was consulted and metabolic encephalopathy improved gradually. Renal US showed diminutive kidneys. Cocaine screen positive Note that patient drinks 20 ounces of alcohol per week  Discharge Condition: Stable  Diet recommendation: Heart healthy low-salt, diabetic  Filed Weights   06/30/14 0500 06/30/14 2100 07/01/14 2002  Weight: 76.2 kg (167 lb 15.9 oz) 75 kg (165 lb 5.5 oz) 77.338 kg (170 lb 8 oz)    Hospital Course:   HTN,  -controlled without medication -HCTZ discontinued this admission  Acute kidney failure  -Unsure baseline   -Improving needs a renal panel as an outpatient in 2-3 days-  Gap/non gap metabolic  acidosis. -Resolved -Anion gap 10 on day of discharge  Hyperkalemia/Hypokalemia - this resolved itself and patient had mild hypomagnesemia but this also resolved   Leukocytosis  -Patient afebrile, will obtain CBC with differential in the a.m. continue to hold antibiotic   -Reactive secondary to cocaine use?  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 She has no focal deficit other than these visual changes. She will need outpatient ophthalmological evaluation at the [place]. She was supposed get spectacles 8 weeks ago but did not receive them   HLD. -Lipid panel showed triglycerides of [id number]  Acute metabolic encephalopathy  -Most likely multifactorial include UDS positive for cocaine, benzodiazepines, and acute renal failure. 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 She is able to make out at 6 feet distance the number of fingers  General:  EOMI NCATCardiovascular:  S1-S2 no murmur rub or  gallop Respiratory:  Clinically clear    Discharge Instructions   Discharge Instructions    Diet - low sodium heart healthy    Complete by:  As directed      Discharge instructions    Complete by:  As directed   Please follow-up with primary care physician Dr. Ginny Forth will need lab tests done in about 1-2 days to determine your kidney function although it appears to have drastically improved since admission Please do not take your hydrochlorothiazide, ibuprofen any longer nor your metformin Your blood pressure can be monitored safely as an outpatient by her primary care physician and he may determine different medications for you You will not be refilled at this time any chronic pain medications on anxiety medications and will need to follow up with your primary physician or the [facility] in [place] for refills on these medications     Increase activity slowly    Complete by:  As directed           Current Discharge Medication List    START taking these medications   Details  gabapentin (NEURONTIN) 400 MG capsule Take 1 capsule (400 mg total) by mouth 2 (two) times daily. Qty: 20 capsule, Refills: 0      CONTINUE these medications which have NOT CHANGED   Details  atorvastatin (LIPITOR) 40 MG tablet Take 40 mg by mouth every evening.    cyclobenzaprine (FLEXERIL) 10 MG tablet Take 10 mg by mouth 2 (two) times daily.     glipiZIDE (GLUCOTROL) 5 MG tablet Take 5 mg by mouth daily before breakfast.     zolpidem (AMBIEN) 10 MG tablet Take 10 mg by mouth at bedtime. Refills: 2    HYDROcodone-acetaminophen (NORCO/VICODIN) 5-325 MG per tablet Take 1 tablet by mouth every 6 (six) hours as needed for moderate pain. Qty: 8 tablet, Refills: 0      STOP taking these medications     ALPRAZolam (XANAX) 1 MG tablet      gabapentin (NEURONTIN) 800 MG tablet      hydrochlorothiazide (HYDRODIURIL) 25 MG tablet      ibuprofen (ADVIL,MOTRIN) 600 MG tablet       oxyCODONE-acetaminophen (PERCOCET) 10-325 MG per tablet      oxyCODONE-acetaminophen (PERCOCET/ROXICET) 5-325 MG per tablet        Allergies  Allergen Reactions  . Morphine And Related Itching  . Penicillins Itching and Nausea And Vomiting      The results of significant diagnostics from this hospitalization (including imaging, microbiology, ancillary and laboratory) are listed below for reference.    Significant Diagnostic Studies: US Renal  06/28/2014   CLINICAL DATA:  Acute renal failure  EXAM: RENAL / URINARY TRACT ULTRASOUND COMPLETE  COMPARISON:  By report from 09/24/2001.  FINDINGS: Right Kidney:  Length: 9.7 cm. Echogenicity within normal limits. No mass or hydronephrosis visualized.  Left Kidney:  Length: 10.5 cm. Hypoechoic structure noted in the upper pole of the left kidney measuring 1.7 cm in greatest dimension. This corresponds to 2 small cysts seen on the previous CT examination. No obstructive changes are noted.  Bladder:  Decompressed by Foley catheter.  IMPRESSION: Cystic structure in the upper pole of the left kidney which by history is stable from a prior CT.  No other focal abnormality is noted.  No obstructive changes noted.   Electronically Signed   By: Inez Catalina M.D.   On: 06/28/2014 19:04   Dg Chest Port 1 View  06/29/2014   CLINICAL DATA:  Concern for at atelectasis on physical examination.  EXAM: PORTABLE CHEST - 1 VIEW  COMPARISON:  Yesterday.  FINDINGS: Poor inspiration. Normal sized heart. Clear lungs. Interval nasogastric tube extending into the stomach. Thoracic spine degenerative changes.  IMPRESSION: No acute abnormality.   Electronically Signed   By: Claudie Revering M.D.   On: 06/29/2014 07:44   Dg Chest Portable 1 View  06/28/2014   CLINICAL DATA:  Altered mental status, incoherent and mumbling, shortness of breath, diabetes, hypertension  EXAM: PORTABLE CHEST - 1 VIEW  COMPARISON:  Portable exam 1418 hr without priors available for comparison.  FINDINGS:  Normal heart size, mediastinal contours, and pulmonary vascularity.  Lungs clear.  No pneumothorax.  Bones unremarkable.  IMPRESSION: Normal exam.   Electronically Signed   By: Lavonia Dana M.D.   On: 06/28/2014 14:23    Microbiology: Recent Results (from the past 240 hour(s))  Culture, blood (routine x 2)     Status: None (Preliminary result)   Collection Time: 06/28/14  2:13 PM  Result Value Ref Range Status   Specimen Description BLOOD RIGHT ARM  Final   Special Requests BOTTLES DRAWN AEROBIC AND ANAEROBIC 5CC  Final   Culture   Final           BLOOD CULTURE RECEIVED NO GROWTH TO DATE CULTURE WILL BE HELD FOR 5 DAYS BEFORE ISSUING A FINAL NEGATIVE REPORT Performed at [facility]    Report Status PENDING  Incomplete  Culture, blood (routine x 2)     Status: None (Preliminary result)   Collection Time: 06/28/14  2:17 PM  Result Value Ref Range Status   Specimen Description BLOOD RIGHT ARM  Final   Special Requests BOTTLES DRAWN AEROBIC AND ANAEROBIC 5CC  Final   Culture   Final           BLOOD CULTURE RECEIVED NO GROWTH TO DATE CULTURE WILL BE HELD FOR 5 DAYS BEFORE ISSUING A FINAL NEGATIVE REPORT Performed at [facility]    Report Status PENDING  Incomplete  MRSA PCR Screening     Status: Abnormal   Collection Time: 06/28/14  4:53 PM  Result Value Ref Range Status   MRSA by PCR POSITIVE (A) NEGATIVE Final    Comment:        The GeneXpert MRSA Assay (FDA approved for NASAL specimens only), is one component of a comprehensive MRSA colonization surveillance program. It is not intended to diagnose MRSA infection nor to guide or monitor treatment for MRSA infections. RESULT CALLED TO, READ BACK BY AND VERIFIED WITH: G.SANTANELA,RN 06/28/14 @2122  BY V.WILKINS   Urine culture     Status: None   Collection Time: 06/28/14 11:13 PM  Result Value Ref Range Status   Specimen Description URINE, CATHETERIZED  Final   Special Requests NONE  Final   Colony Count    Final    >=100,000 COLONIES/ML Performed at [facility]    Culture   Final    ESCHERICHIA COLI Performed at [facility]    Report Status 07/01/2014 FINAL  Final   Organism ID, Bacteria ESCHERICHIA COLI  Final      Susceptibility   Escherichia coli - MIC*    AMPICILLIN <=2 SENSITIVE Sensitive  CEFAZOLIN <=4 SENSITIVE Sensitive     CEFTRIAXONE <=1 SENSITIVE Sensitive     CIPROFLOXACIN <=0.25 SENSITIVE Sensitive     GENTAMICIN <=1 SENSITIVE Sensitive     LEVOFLOXACIN <=0.12 SENSITIVE Sensitive     NITROFURANTOIN <=16 SENSITIVE Sensitive     TOBRAMYCIN <=1 SENSITIVE Sensitive     TRIMETH/SULFA <=20 SENSITIVE Sensitive     PIP/TAZO <=4 SENSITIVE Sensitive     * ESCHERICHIA COLI     Labs: Basic Metabolic Panel:  Recent Labs Lab 06/29/14 0750 06/29/14 1539 06/30/14 0215 07/01/14 0534 07/02/14 0336  NA 156* 154* 149* 141 144  K 5.1 4.3 3.7 3.0* 4.0  CL 121* 121* 116* 109 112*  CO2 16* 17* 23 20* 22  GLUCOSE 97 175* 167* 105* 99  BUN 121* 104* 84* 48* 29*  CREATININE 9.27* 7.39* 4.81* 2.37* 1.69*  CALCIUM 9.5 9.0 9.1 8.4* 8.2*  MG  --   --   --   --  1.1*  PHOS  --   --   --  1.9*  --    Liver Function Tests:  Recent Labs Lab 06/28/14 1420 07/01/14 0534 07/02/14 0336  AST 26  --  20  ALT 21  --  19  ALKPHOS 100  --  77  BILITOT 0.7  --  0.2*  PROT 8.3*  --  5.5*  ALBUMIN 4.5 2.8* 2.8*   No results for input(s): LIPASE, AMYLASE in the last 168 hours. No results for input(s): AMMONIA in the last 168 hours. CBC:  Recent Labs Lab 06/28/14 1420 06/28/14 1432 06/29/14 0257 06/30/14 0215 07/01/14 0534 07/02/14 0336  WBC 8.1  --  16.2* 14.5* 12.9* 10.5  NEUTROABS 6.5  --   --   --   --  5.5  HGB 12.8 15.3* 11.2* 10.8* 10.5* 10.1*  HCT 40.4 45.0 35.6* 33.4* 32.5* 31.2*  MCV 82.4  --  82.4 79.7 78.9 78.4  PLT 282  --  381 337 316 342   Cardiac Enzymes:  Recent Labs Lab 06/28/14 1620  CKTOTAL 123   BNP: BNP (last 3 results) No  results for input(s): BNP in the last 8760 hours.  ProBNP (last 3 results) No results for input(s): PROBNP in the last 8760 hours.  CBG:  Recent Labs Lab 07/01/14 0800 07/01/14 1220 07/01/14 1636 07/01/14 2126 07/02/14 0822  GLUCAP 132* 110* 140* 133* 107*       Signed:  Jimmi Sidener, JAI-GURMUKH  [facility] 07/02/2014, 11:00 AM

## 2014-07-02 NOTE — Care Management Note (Signed)
Case Management Note  Patient Details  Name: EVONDA VANDEVEN MRN: [id number] Date of Birth: 1956-02-04  Subjective/Objective:                    Action/Plan:   Expected Discharge Date:                  Expected Discharge Plan:  [place]  In-House Referral:  Clinical Social Work  Discharge planning Services  NA  Post Acute Care Choice:  NA Choice offered to:  Patient  DME Arranged:  Gilford Rile rolling DME Agency:  [place]:    [facility] Agency:     Status of Service:  In process, will continue to follow  Medicare Important Message Given:  Yes Date Medicare IM Given:  07/02/14 Medicare IM give by:  Jasmine Pang RN MPH, case manager, [phone] Date Additional Medicare IM Given:    Additional Medicare Important Message give by:     If discussed at Door of Stay Meetings, dates discussed:    Additional Comments:  Adron Bene, RN 07/02/2014, 10:48 AM

## 2014-07-04 LAB — CULTURE, BLOOD (ROUTINE X 2)
CULTURE: NO GROWTH
Culture: NO GROWTH

## 2014-08-15 ENCOUNTER — Ambulatory Visit (INDEPENDENT_AMBULATORY_CARE_PROVIDER_SITE_OTHER): Payer: 59 | Admitting: Diagnostic Neuroimaging

## 2014-08-15 ENCOUNTER — Encounter: Payer: Self-pay | Admitting: Diagnostic Neuroimaging

## 2014-08-15 VITALS — BP 179/91 | HR 74 | Ht 62.0 in | Wt 176.4 lb

## 2014-08-15 DIAGNOSIS — E1142 Type 2 diabetes mellitus with diabetic polyneuropathy: Secondary | ICD-10-CM

## 2014-08-15 DIAGNOSIS — M5412 Radiculopathy, cervical region: Secondary | ICD-10-CM

## 2014-08-15 DIAGNOSIS — M542 Cervicalgia: Secondary | ICD-10-CM
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
## 2014-08-15 NOTE — Patient Instructions (Signed)
Increase gabapentin to 800mg  three times day.  Consider adding cymbalta 30-60mg  daily.

## 2014-11-12 ENCOUNTER — Telehealth: Payer: Self-pay | Admitting: Diagnostic Neuroimaging

## 2014-11-12 NOTE — Telephone Encounter (Signed)
Pt called and is wanting to know when she is to have her MRI or if she still needs it. The last office notes have an order for MRI. Please call and advise [phone]

## 2014-11-12 NOTE — Telephone Encounter (Signed)
Left vm giving patient instructions to call [facility] to schedule MRI. Informed her that [place] attempted to reach her three times, and explained that she needs to call them to schedule.  Gave her phone number, repeated phone number. Left this caller's name.Marland Kitchen

## 2014-11-14 ENCOUNTER — Telehealth: Payer: Self-pay | Admitting: Diagnostic Neuroimaging

## 2014-11-14 NOTE — Telephone Encounter (Signed)
Pt called and has appt on 9/28 and an MRI on 9/30. And wants to know if she should make different appt to see physician after MRI has been completed. Please call and advise

## 2014-11-14 NOTE — Telephone Encounter (Signed)
Please change follow up appt to after MRI scan is done. -VRP

## 2014-11-17 NOTE — Telephone Encounter (Addendum)
Mobile number got recording, "This is not a working number." Unable to leave vm on home phone, but patient needs to be rescheduled for FU after 11/21/14. Will attempt call later.  Patient called back; new FU scheduled through phone staff.

## 2014-11-19 ENCOUNTER — Ambulatory Visit: Payer: Medicare (Managed Care) | Admitting: Diagnostic Neuroimaging

## 2014-11-21 ENCOUNTER — Ambulatory Visit
Admission: RE | Admit: 2014-11-21 | Discharge: 2014-11-21 | Disposition: A | Discharge status: Home / Self Care | Payer: Medicare Other | Source: Ambulatory Visit | Attending: Diagnostic Neuroimaging | Admitting: Diagnostic Neuroimaging

## 2014-11-21 DIAGNOSIS — M542 Cervicalgia: Secondary | ICD-10-CM | POA: Diagnosis not present

## 2014-11-21 DIAGNOSIS — M5412 Radiculopathy, cervical region: Secondary | ICD-10-CM

## 2014-11-25 ENCOUNTER — Ambulatory Visit (INDEPENDENT_AMBULATORY_CARE_PROVIDER_SITE_OTHER): Payer: Medicare (Managed Care) | Admitting: Diagnostic Neuroimaging

## 2014-11-25 ENCOUNTER — Encounter: Payer: Self-pay | Admitting: Diagnostic Neuroimaging

## 2014-11-25 VITALS — BP 138/88 | HR 104 | Ht 62.0 in | Wt 180.0 lb

## 2014-11-25 DIAGNOSIS — M79642 Pain in left hand: Secondary | ICD-10-CM

## 2014-11-25 DIAGNOSIS — E1142 Type 2 diabetes mellitus with diabetic polyneuropathy: Secondary | ICD-10-CM | POA: Diagnosis not present

## 2014-11-25 DIAGNOSIS — G5622 Lesion of ulnar nerve, left upper limb: Secondary | ICD-10-CM | POA: Diagnosis not present
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
## 2014-11-25 NOTE — Patient Instructions (Signed)
Thank you for coming to see [place] at [facility]. I hope we have been able to provide you high quality care today.  You may receive a patient satisfaction survey over the next few weeks. We would appreciate your feedback and comments so that we may continue to improve ourselves and the health of our patients.  - follow up with Dr. Jonelle Sidle re: pain management or referral to pain clinic - consider occupational therapy   ~~~~~~~~~~~~~~~~~~~~~~~~~~~~~~~~~~~~~~~~~~~~~~~~~~~~~~~~~~~~~~~~~  DR. Johathon Overturf'S GUIDE TO HAPPY AND HEALTHY LIVING These are some of my general health and wellness recommendations. Some of them may apply to you better than others. Please use common sense as you try these suggestions and feel free to ask me any questions.   ACTIVITY/FITNESS Mental, social, emotional and physical stimulation are very important for brain and body health. Try learning a new activity (arts, music, language, sports, games).  Keep moving your body to the best of your abilities. You can do this at home, inside or outside, the park, community center, gym or anywhere you like. Consider a physical therapist or personal trainer to get started. Consider the app Sworkit. Fitness trackers such as smart-watches, smart-phones or Fitbits can help as well.   NUTRITION Eat more plants: colorful vegetables, nuts, seeds and berries.  Eat less sugar, salt, preservatives and processed foods.  Avoid toxins such as cigarettes and alcohol.  Drink water when you are thirsty. Warm water with a slice of lemon is an excellent morning drink to start the day.  Consider these websites for more information The Nutrition Source (https://www.henry-hernandez.biz/) [employer] Nutrition (WindowBlog.ch)   RELAXATION Consider practicing mindfulness meditation or other relaxation techniques such as deep breathing, prayer, yoga, tai chi, massage. See website mindful.org  or the apps Headspace or Calm to help get started.   SLEEP Try to get at least 7-8+ hours sleep per day. Regular exercise and reduced caffeine will help you sleep better. Practice good sleep hygeine techniques. See website sleep.org for more information.   PLANNING Prepare estate planning, living will, healthcare POA documents. Sometimes this is best planned with the help of an attorney. Theconversationproject.org and agingwithdignity.org are excellent resources.

## 2016-02-08 ENCOUNTER — Encounter (HOSPITAL_COMMUNITY): Payer: Self-pay

## 2016-02-08 ENCOUNTER — Emergency Department (HOSPITAL_COMMUNITY)
Admission: EM | Admit: 2016-02-08 | Discharge: 2016-02-08 | Disposition: A | Discharge status: Home / Self Care | Payer: Medicare Other | Attending: Emergency Medicine | Admitting: Emergency Medicine

## 2016-02-08 DIAGNOSIS — Z87891 Personal history of nicotine dependence: Secondary | ICD-10-CM | POA: Insufficient documentation

## 2016-02-08 DIAGNOSIS — Z79899 Other long term (current) drug therapy: Secondary | ICD-10-CM | POA: Insufficient documentation

## 2016-02-08 DIAGNOSIS — R739 Hyperglycemia, unspecified: Secondary | ICD-10-CM

## 2016-02-08 DIAGNOSIS — E1165 Type 2 diabetes mellitus with hyperglycemia: Secondary | ICD-10-CM | POA: Insufficient documentation

## 2016-02-08 DIAGNOSIS — Z7984 Long term (current) use of oral hypoglycemic drugs: Secondary | ICD-10-CM | POA: Insufficient documentation

## 2016-02-08 DIAGNOSIS — Z760 Encounter for issue of repeat prescription: Secondary | ICD-10-CM

## 2016-02-08 DIAGNOSIS — I10 Essential (primary) hypertension: Secondary | ICD-10-CM | POA: Insufficient documentation

## 2016-02-08 DIAGNOSIS — E1140 Type 2 diabetes mellitus with diabetic neuropathy, unspecified: Secondary | ICD-10-CM | POA: Insufficient documentation

## 2016-02-08 LAB — CBG MONITORING, ED: GLUCOSE-CAPILLARY: 260 mg/dL — AB (ref 65–99)

## 2016-02-08 MED ORDER — GABAPENTIN 600 MG PO TABS
600.0000 mg | ORAL_TABLET | Freq: Three times a day (TID) | ORAL | Status: DC
  Filled 2016-02-08: qty 1

## 2016-02-08 MED ORDER — GLIPIZIDE 5 MG PO TABS: 5.0000 mg | ORAL_TABLET | Freq: Every day | ORAL | 0 refills | Status: DC

## 2016-02-08 MED ORDER — HYDROXYZINE PAMOATE 50 MG PO CAPS
50.0000 mg | ORAL_CAPSULE | Freq: Four times a day (QID) | ORAL | Status: DC | PRN
  Filled 2016-02-08: qty 1

## 2016-02-08 MED ORDER — GABAPENTIN 300 MG PO CAPS
600.0000 mg | ORAL_CAPSULE | Freq: Three times a day (TID) | ORAL | Status: DC
  Administered 2016-02-08: 600 mg via ORAL
  Filled 2016-02-08 (×2): qty 2

## 2016-02-08 MED ORDER — HYDROXYZINE HCL 25 MG PO TABS
50.0000 mg | ORAL_TABLET | Freq: Four times a day (QID) | ORAL | Status: DC | PRN
  Administered 2016-02-08: 50 mg via ORAL
  Filled 2016-02-08 (×3): qty 2

## 2016-02-08 MED ORDER — HYDROXYZINE PAMOATE 50 MG PO CAPS: ORAL_CAPSULE | ORAL | 0 refills | Status: DC

## 2016-02-08 MED ORDER — GLIPIZIDE 5 MG PO TABS
5.0000 mg | ORAL_TABLET | Freq: Every day | ORAL | Status: DC
  Administered 2016-02-08: 5 mg via ORAL
  Filled 2016-02-08 (×2): qty 1

## 2016-02-08 MED ORDER — GABAPENTIN 600 MG PO TABS: 600.0000 mg | ORAL_TABLET | Freq: Three times a day (TID) | ORAL | 1 refills | Status: DC
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
## 2016-02-08 NOTE — Discharge Instructions (Signed)
Follow-up with your primary care doctor as planned. Continue your current medications.

## 2016-02-08 NOTE — ED Notes (Signed)
Pt states that she really came for medication refills for Neurontin and glipizide, because she will not be able to get medications until Wed. That she has not taken her medication for 2 days. Pt is c/o neuropathy  Pain to bilateral feet and hands.

## 2016-02-08 NOTE — ED Notes (Signed)
Bed: WA12 Expected date:  Expected time:  Means of arrival:  Comments: EMS 

## 2016-11-21 ENCOUNTER — Observation Stay (HOSPITAL_COMMUNITY)
Admission: EM | Admit: 2016-11-21 | Discharge: 2016-11-24 | Disposition: A | Discharge status: Home / Self Care | Payer: Medicare Other | Attending: Internal Medicine | Admitting: Internal Medicine

## 2016-11-21 ENCOUNTER — Encounter (HOSPITAL_COMMUNITY): Payer: Self-pay

## 2016-11-21 DIAGNOSIS — E1165 Type 2 diabetes mellitus with hyperglycemia: Secondary | ICD-10-CM | POA: Diagnosis present

## 2016-11-21 DIAGNOSIS — G9341 Metabolic encephalopathy: Secondary | ICD-10-CM | POA: Diagnosis not present

## 2016-11-21 DIAGNOSIS — Z87891 Personal history of nicotine dependence: Secondary | ICD-10-CM | POA: Diagnosis not present

## 2016-11-21 DIAGNOSIS — E1122 Type 2 diabetes mellitus with diabetic chronic kidney disease: Secondary | ICD-10-CM | POA: Diagnosis not present

## 2016-11-21 DIAGNOSIS — Z79899 Other long term (current) drug therapy: Secondary | ICD-10-CM | POA: Diagnosis not present

## 2016-11-21 DIAGNOSIS — E1142 Type 2 diabetes mellitus with diabetic polyneuropathy: Secondary | ICD-10-CM | POA: Insufficient documentation

## 2016-11-21 DIAGNOSIS — L98491 Non-pressure chronic ulcer of skin of other sites limited to breakdown of skin: Secondary | ICD-10-CM | POA: Insufficient documentation

## 2016-11-21 DIAGNOSIS — N179 Acute kidney failure, unspecified: Secondary | ICD-10-CM | POA: Insufficient documentation

## 2016-11-21 DIAGNOSIS — Z885 Allergy status to narcotic agent status: Secondary | ICD-10-CM | POA: Insufficient documentation

## 2016-11-21 DIAGNOSIS — D631 Anemia in chronic kidney disease: Secondary | ICD-10-CM | POA: Diagnosis not present

## 2016-11-21 DIAGNOSIS — Z888 Allergy status to other drugs, medicaments and biological substances status: Secondary | ICD-10-CM | POA: Diagnosis not present

## 2016-11-21 DIAGNOSIS — E162 Hypoglycemia, unspecified: Secondary | ICD-10-CM

## 2016-11-21 DIAGNOSIS — Z880 Allergy status to penicillin: Secondary | ICD-10-CM | POA: Diagnosis not present

## 2016-11-21 DIAGNOSIS — E11649 Type 2 diabetes mellitus with hypoglycemia without coma: Principal | ICD-10-CM | POA: Insufficient documentation

## 2016-11-21 DIAGNOSIS — F329 Major depressive disorder, single episode, unspecified: Secondary | ICD-10-CM | POA: Insufficient documentation

## 2016-11-21 DIAGNOSIS — N289 Disorder of kidney and ureter, unspecified: Secondary | ICD-10-CM

## 2016-11-21 DIAGNOSIS — Z7984 Long term (current) use of oral hypoglycemic drugs: Secondary | ICD-10-CM | POA: Diagnosis not present

## 2016-11-21 DIAGNOSIS — E875 Hyperkalemia: Secondary | ICD-10-CM | POA: Insufficient documentation

## 2016-11-21 DIAGNOSIS — E11622 Type 2 diabetes mellitus with other skin ulcer: Secondary | ICD-10-CM | POA: Diagnosis not present

## 2016-11-21 DIAGNOSIS — E160 Drug-induced hypoglycemia without coma: Secondary | ICD-10-CM | POA: Diagnosis not present

## 2016-11-21 DIAGNOSIS — E785 Hyperlipidemia, unspecified: Secondary | ICD-10-CM | POA: Insufficient documentation

## 2016-11-21 DIAGNOSIS — D75839 Thrombocytosis, unspecified: Secondary | ICD-10-CM

## 2016-11-21 DIAGNOSIS — N184 Chronic kidney disease, stage 4 (severe): Secondary | ICD-10-CM | POA: Diagnosis not present

## 2016-11-21 DIAGNOSIS — E119 Type 2 diabetes mellitus without complications: Secondary | ICD-10-CM | POA: Diagnosis present

## 2016-11-21 DIAGNOSIS — T383X1A Poisoning by insulin and oral hypoglycemic [antidiabetic] drugs, accidental (unintentional), initial encounter: Secondary | ICD-10-CM

## 2016-11-21 DIAGNOSIS — I129 Hypertensive chronic kidney disease with stage 1 through stage 4 chronic kidney disease, or unspecified chronic kidney disease: Secondary | ICD-10-CM | POA: Insufficient documentation

## 2016-11-21 DIAGNOSIS — D649 Anemia, unspecified: Secondary | ICD-10-CM

## 2016-11-21 DIAGNOSIS — D473 Essential (hemorrhagic) thrombocythemia: Secondary | ICD-10-CM

## 2016-11-21 LAB — DIFFERENTIAL
BASOS PCT: 0 %
Basophils Absolute: 0 10*3/uL (ref 0.0–0.1)
EOS PCT: 1 %
Eosinophils Absolute: 0.1 10*3/uL (ref 0.0–0.7)
Lymphocytes Relative: 33 %
Lymphs Abs: 3.1 10*3/uL (ref 0.7–4.0)
MONO ABS: 0.7 10*3/uL (ref 0.1–1.0)
Monocytes Relative: 7 %
Neutro Abs: 5.4 10*3/uL (ref 1.7–7.7)
Neutrophils Relative %: 59 %

## 2016-11-21 LAB — URINALYSIS, ROUTINE W REFLEX MICROSCOPIC
BILIRUBIN URINE: NEGATIVE
Glucose, UA: NEGATIVE mg/dL
HGB URINE DIPSTICK: NEGATIVE
Ketones, ur: NEGATIVE mg/dL
Leukocytes, UA: NEGATIVE
Nitrite: NEGATIVE
PROTEIN: NEGATIVE mg/dL
Specific Gravity, Urine: 1.011 (ref 1.005–1.030)
pH: 5 (ref 5.0–8.0)

## 2016-11-21 LAB — CBG MONITORING, ED
GLUCOSE-CAPILLARY: 32 mg/dL — AB (ref 65–99)
GLUCOSE-CAPILLARY: 70 mg/dL (ref 65–99)
GLUCOSE-CAPILLARY: 114 mg/dL — AB (ref 65–99)
Glucose-Capillary: 106 mg/dL — ABNORMAL HIGH (ref 65–99)
Glucose-Capillary: 83 mg/dL (ref 65–99)
Glucose-Capillary: 86 mg/dL (ref 65–99)

## 2016-11-21 LAB — CBC
HCT: 34.1 % — ABNORMAL LOW (ref 36.0–46.0)
Hemoglobin: 10.8 g/dL — ABNORMAL LOW (ref 12.0–15.0)
MCH: 25.5 pg — AB (ref 26.0–34.0)
MCHC: 31.7 g/dL (ref 30.0–36.0)
MCV: 80.4 fL (ref 78.0–100.0)
PLATELETS: 552 10*3/uL — AB (ref 150–400)
RBC: 4.24 MIL/uL (ref 3.87–5.11)
RDW: 15.7 % — ABNORMAL HIGH (ref 11.5–15.5)
WBC: 9.4 10*3/uL (ref 4.0–10.5)

## 2016-11-21 LAB — BASIC METABOLIC PANEL
Anion gap: 10 (ref 5–15)
BUN: 57 mg/dL — ABNORMAL HIGH (ref 6–20)
CALCIUM: 9.4 mg/dL (ref 8.9–10.3)
CO2: 22 mmol/L (ref 22–32)
CREATININE: 1.97 mg/dL — AB (ref 0.44–1.00)
Chloride: 109 mmol/L (ref 101–111)
GFR calc non Af Amer: 26 mL/min — ABNORMAL LOW (ref >60)
GFR, EST AFRICAN AMERICAN: 30 mL/min — AB (ref >60)
Glucose, Bld: 67 mg/dL (ref 65–99)
Potassium: 6 mmol/L — ABNORMAL HIGH (ref 3.5–5.1)
Sodium: 141 mmol/L (ref 135–145)

## 2016-11-21 MED ORDER — ONDANSETRON HCL 4 MG/2ML IJ SOLN: 4.0000 mg | Freq: Four times a day (QID) | INTRAMUSCULAR | Status: DC | PRN

## 2016-11-21 MED ORDER — ZOLPIDEM TARTRATE 5 MG PO TABS
5.0000 mg | ORAL_TABLET | Freq: Every evening | ORAL | Status: DC | PRN
Start: 2016-11-21 — End: 2016-11-24
  Administered 2016-11-22 – 2016-11-23 (×3): 5 mg via ORAL
  Filled 2016-11-21 (×3): qty 1

## 2016-11-21 MED ORDER — ACETAMINOPHEN 650 MG RE SUPP: 650.0000 mg | Freq: Four times a day (QID) | RECTAL | Status: DC | PRN

## 2016-11-21 MED ORDER — CLINDAMYCIN HCL 150 MG PO CAPS
150.0000 mg | ORAL_CAPSULE | Freq: Three times a day (TID) | ORAL | Status: DC
  Administered 2016-11-22: 150 mg via ORAL
  Filled 2016-11-21 (×3): qty 1

## 2016-11-21 MED ORDER — GABAPENTIN 300 MG PO CAPS
600.0000 mg | ORAL_CAPSULE | Freq: Every day | ORAL | Status: DC
  Administered 2016-11-22 – 2016-11-23 (×2): 600 mg via ORAL
  Filled 2016-11-21 (×2): qty 2

## 2016-11-21 MED ORDER — FUROSEMIDE 40 MG PO TABS
40.0000 mg | ORAL_TABLET | Freq: Every day | ORAL | Status: DC
  Administered 2016-11-22 – 2016-11-23 (×2): 40 mg via ORAL
  Filled 2016-11-21 (×3): qty 1

## 2016-11-21 MED ORDER — ALPRAZOLAM 0.5 MG PO TABS
0.5000 mg | ORAL_TABLET | Freq: Every day | ORAL | Status: DC | PRN
  Administered 2016-11-22 – 2016-11-23 (×2): 0.5 mg via ORAL
  Filled 2016-11-21 (×2): qty 1

## 2016-11-21 MED ORDER — DEXTROSE-NACL 5-0.45 % IV SOLN
INTRAVENOUS | Status: DC
  Administered 2016-11-22: via INTRAVENOUS

## 2016-11-21 MED ORDER — ACETAMINOPHEN 325 MG PO TABS
650.0000 mg | ORAL_TABLET | Freq: Four times a day (QID) | ORAL | Status: DC | PRN
  Administered 2016-11-22: 650 mg via ORAL
  Filled 2016-11-21: qty 2

## 2016-11-21 MED ORDER — ONDANSETRON HCL 4 MG PO TABS: 4.0000 mg | ORAL_TABLET | Freq: Four times a day (QID) | ORAL | Status: DC | PRN

## 2016-11-21 MED ORDER — DEXTROSE 5 % IV SOLN
Freq: Once | INTRAVENOUS | Status: AC
  Administered 2016-11-21: 21:00:00 via INTRAVENOUS

## 2016-11-21 MED ORDER — HYDROXYZINE HCL 25 MG PO TABS
50.0000 mg | ORAL_TABLET | Freq: Three times a day (TID) | ORAL | Status: DC | PRN
  Administered 2016-11-22 (×2): 50 mg via ORAL
  Filled 2016-11-21 (×2): qty 2

## 2016-11-21 MED ORDER — DEXTROSE 50 % IV SOLN
50.0000 mL | Freq: Once | INTRAVENOUS | Status: AC
  Administered 2016-11-21: 50 mL via INTRAVENOUS
  Filled 2016-11-21: qty 50

## 2016-11-21 MED ORDER — HEPARIN SODIUM (PORCINE) 5000 UNIT/ML IJ SOLN
5000.0000 [IU] | Freq: Three times a day (TID) | INTRAMUSCULAR | Status: DC
  Administered 2016-11-22 – 2016-11-24 (×6): 5000 [IU] via SUBCUTANEOUS
  Filled 2016-11-21 (×6): qty 1

## 2016-11-21 NOTE — ED Triage Notes (Signed)
Pt reports that this morning she was incoherent and blood sugar was 36 according to family when EMS saw pt. Pt then went to [place] clinic and was told to come here on arrival pt had CBG of 32. Pt reports feeling weak for the last several days.

## 2016-11-21 NOTE — ED Notes (Signed)
Dr. Luciano Cutter notified of CBG-32 in triage. Food and juice given.
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
## 2016-11-22 DIAGNOSIS — T383X1A Poisoning by insulin and oral hypoglycemic [antidiabetic] drugs, accidental (unintentional), initial encounter: Secondary | ICD-10-CM | POA: Diagnosis not present

## 2016-11-22 DIAGNOSIS — E160 Drug-induced hypoglycemia without coma: Secondary | ICD-10-CM | POA: Diagnosis not present

## 2016-11-22 LAB — BASIC METABOLIC PANEL
ANION GAP: 9 (ref 5–15)
ANION GAP: 9 (ref 5–15)
BUN: 54 mg/dL — ABNORMAL HIGH (ref 6–20)
BUN: 50 mg/dL — ABNORMAL HIGH (ref 6–20)
CHLORIDE: 112 mmol/L — AB (ref 101–111)
CHLORIDE: 104 mmol/L (ref 101–111)
CO2: 19 mmol/L — AB (ref 22–32)
CO2: 25 mmol/L (ref 22–32)
Calcium: 9.1 mg/dL (ref 8.9–10.3)
Calcium: 9.8 mg/dL (ref 8.9–10.3)
Creatinine, Ser: 1.87 mg/dL — ABNORMAL HIGH (ref 0.44–1.00)
Creatinine, Ser: 1.74 mg/dL — ABNORMAL HIGH (ref 0.44–1.00)
GFR calc non Af Amer: 28 mL/min — ABNORMAL LOW (ref >60)
GFR calc non Af Amer: 30 mL/min — ABNORMAL LOW (ref >60)
GFR, EST AFRICAN AMERICAN: 32 mL/min — AB (ref >60)
GFR, EST AFRICAN AMERICAN: 35 mL/min — AB (ref >60)
GLUCOSE: 90 mg/dL (ref 65–99)
Glucose, Bld: 212 mg/dL — ABNORMAL HIGH (ref 65–99)
Potassium: 5.8 mmol/L — ABNORMAL HIGH (ref 3.5–5.1)
Potassium: 5.2 mmol/L — ABNORMAL HIGH (ref 3.5–5.1)
Sodium: 140 mmol/L (ref 135–145)
Sodium: 138 mmol/L (ref 135–145)

## 2016-11-22 LAB — GLUCOSE, CAPILLARY
GLUCOSE-CAPILLARY: 173 mg/dL — AB (ref 65–99)
GLUCOSE-CAPILLARY: 232 mg/dL — AB (ref 65–99)
Glucose-Capillary: 86 mg/dL (ref 65–99)
Glucose-Capillary: 86 mg/dL (ref 65–99)
Glucose-Capillary: 92 mg/dL (ref 65–99)
Glucose-Capillary: 95 mg/dL (ref 65–99)
Glucose-Capillary: 228 mg/dL — ABNORMAL HIGH (ref 65–99)
Glucose-Capillary: 238 mg/dL — ABNORMAL HIGH (ref 65–99)

## 2016-11-22 LAB — CBG MONITORING, ED
GLUCOSE-CAPILLARY: 88 mg/dL (ref 65–99)
GLUCOSE-CAPILLARY: 88 mg/dL (ref 65–99)

## 2016-11-22 LAB — POTASSIUM: POTASSIUM: 6.1 mmol/L — AB (ref 3.5–5.1)

## 2016-11-22 LAB — HIV ANTIBODY (ROUTINE TESTING W REFLEX): HIV SCREEN 4TH GENERATION: NONREACTIVE

## 2016-11-22 MED ORDER — SODIUM POLYSTYRENE SULFONATE 15 GM/60ML PO SUSP
15.0000 g | Freq: Once | ORAL | Status: AC
  Administered 2016-11-22: 15 g via ORAL
  Filled 2016-11-22: qty 60

## 2016-11-22 MED ORDER — HYDROXYZINE HCL 25 MG PO TABS
100.0000 mg | ORAL_TABLET | Freq: Three times a day (TID) | ORAL | Status: DC | PRN
  Administered 2016-11-22 – 2016-11-24 (×5): 100 mg via ORAL
  Filled 2016-11-22 (×5): qty 4

## 2016-11-22 MED ORDER — SODIUM BICARBONATE 650 MG PO TABS
650.0000 mg | ORAL_TABLET | Freq: Two times a day (BID) | ORAL | Status: DC
  Administered 2016-11-22 – 2016-11-24 (×5): 650 mg via ORAL
  Filled 2016-11-22 (×5): qty 1

## 2016-11-22 MED ORDER — INFLUENZA VAC SPLIT QUAD 0.5 ML IM SUSY: 0.5000 mL | PREFILLED_SYRINGE | INTRAMUSCULAR | Status: DC

## 2016-11-22 MED ORDER — PNEUMOCOCCAL VAC POLYVALENT 25 MCG/0.5ML IJ INJ
0.5000 mL | INJECTION | INTRAMUSCULAR | Status: DC
  Filled 2016-11-22: qty 0.5

## 2016-11-22 MED ORDER — DOXYCYCLINE HYCLATE 100 MG PO TABS
100.0000 mg | ORAL_TABLET | Freq: Two times a day (BID) | ORAL | Status: DC
  Administered 2016-11-22 – 2016-11-23 (×3): 100 mg via ORAL
  Filled 2016-11-22 (×3): qty 1

## 2016-11-22 NOTE — Progress Notes (Signed)
RT placed patient on CPAP. Patient setting is 5-20 cmH2O. Sterile water added to water chamber for humidification. Patient is tolerating well.

## 2016-11-22 NOTE — ED Notes (Signed)
IV team at bedside 

## 2016-11-22 NOTE — Progress Notes (Signed)
PROGRESS NOTE    [name] [name]  TJQ:[id number] DOB: 1955/07/22 DOA: 11/21/2016 PCP: System, Pcp Not In   Brief Narrative: [name] [name] is a 61 y.o. female with medical history significant of CKD, DM2, HTN.  Patient sent in by [facility] after several days of persistent hypoglycemia at home.  CBG in 20s-30s.   ED Course: Given D50 and started on D5.  Creat 1.9 (patient reports CKD), K 6.0 (unclear if hemolyzed).  Patient also has skin ulcers that she just got started on clinda for today.   Assessment & Plan:   Principal Problem:   Hypoglycemia secondary to sulfonylurea Active Problems:   Diabetes type 2, uncontrolled (HCC)   CKD (chronic kidney disease) stage 4, GFR 15-29 ml/min (HCC)   1-Hypoglycemia; in setting of sulfonylurea and Renal failure.  Continue to hold glipizide and actos.  Continue with D 5 IV fluids.  CBG at 90 on D 5 IV fluid.   2-Hyperkalemia; in setting CKD and ACE use.  Continue to hold ACE>  K this am at 5.9.  Kayexalate ordered.  Also patient to received lasix.  Repeat B-met this afternoon.  Change diet to renal diet. Needs education.   3-DM with hypoglycemia; hold oral medication.   4-CKD stage IV;  Last cr two years ago was at 1.69. Cr on admission at 1.9.  Mild metabolic acidosis, start oral bicarb.   5-small ulcer R shoulder, R  shoulder history of recent sx;  She was started on clindamycin by [place]. I will change to doxy, less risk for C diff.       DVT prophylaxis: Heparin  Code Status: Full code.  Family Communication: care discussed with patient.  Disposition Plan: home at time of discharge    Consultants:   none   Procedures: none   Antimicrobials:    Subjective: Denies abdominal pain, chest pain   Objective: Vitals:   11/21/16 2243 11/22/16 0132 11/22/16 0217 11/22/16 0231  BP: (!) 110/54 (!) 111/58 (!) 133/118 126/64  Pulse: (!) 101 69 91 81  Resp: (!) _0 Temp:   98.3 F (36.8 C)     TempSrc:   Oral   SpO2: 100% 98% 99%   Weight:      Height:        Intake/Output Summary (Last 24 hours) at 11/22/16 0858 Last data filed at 11/22/16 0833  Gross per 24 hour  Intake           356.25 ml  Output                0 ml  Net           356.25 ml   Filed Weights   11/21/16 1901  Weight: 90.7 kg (200 lb)    Examination:  General exam: Appears calm and comfortable  Respiratory system: Clear to auscultation. Respiratory effort normal. Cardiovascular system: S1 & S2 heard, RRR. No JVD, murmurs, rubs, gallops or clicks. No pedal edema. Gastrointestinal system: Abdomen is nondistended, soft and nontender. No organomegaly or masses felt. Normal bowel sounds heard. Central nervous system: Alert and oriented. No focal neurological deficits. Extremities: Symmetric 5 x 5 power. Skin: small round ulceration, right shoulder.    Data Reviewed: I have personally reviewed following labs and imaging studies  CBC:  Recent Labs Lab 11/21/16 2148  WBC 9.4  NEUTROABS 5.4  HGB 10.8*  HCT 34.1*  MCV 80.4  PLT 762*   Basic Metabolic Panel:  Recent  Labs Lab 11/21/16 2148 11/22/16 0001 11/22/16 0726  NA 141  --  140  K 6.0* 6.1* 5.8*  CL 109  --  112*  CO2 22  --  19*  GLUCOSE 67  --  90  BUN 57*  --  54*  CREATININE 1.97*  --  1.87*  CALCIUM 9.4  --  9.1   GFR: Estimated Creatinine Clearance: 32.2 mL/min (A) (by C-G formula based on SCr of 1.87 mg/dL (H)). Liver Function Tests: No results for input(s): AST, ALT, ALKPHOS, BILITOT, PROT, ALBUMIN in the last 168 hours. No results for input(s): LIPASE, AMYLASE in the last 168 hours. No results for input(s): AMMONIA in the last 168 hours. Coagulation Profile: No results for input(s): INR, PROTIME in the last 168 hours. Cardiac Enzymes: No results for input(s): CKTOTAL, CKMB, CKMBINDEX, TROPONINI in the last 168 hours. BNP (last 3 results) No results for input(s): PROBNP in the last 8760 hours. HbA1C: No results for  input(s): HGBA1C in the last 72 hours. CBG:  Recent Labs Lab 11/22/16 0120 11/22/16 0221 11/22/16 0355 11/22/16 0605 11/22/16 0726  GLUCAP 88 86 86 92 95   Lipid Profile: No results for input(s): CHOL, HDL, LDLCALC, TRIG, CHOLHDL, LDLDIRECT in the last 72 hours. Thyroid Function Tests: No results for input(s): TSH, T4TOTAL, FREET4, T3FREE, THYROIDAB in the last 72 hours. Anemia Panel: No results for input(s): VITAMINB12, FOLATE, FERRITIN, TIBC, IRON, RETICCTPCT in the last 72 hours. Sepsis Labs: No results for input(s): PROCALCITON, LATICACIDVEN in the last 168 hours.  No results found for this or any previous visit (from the past 240 hour(s)).       Radiology Studies: No results found.      Scheduled Meds: . clindamycin  150 mg Oral TID  . furosemide  40 mg Oral Daily  . gabapentin  600 mg Oral Daily  . heparin  5,000 Units Subcutaneous Q8H  . [START ON 11/23/2016] Influenza vac split quadrivalent PF  0.5 mL Intramuscular Tomorrow-1000  . [START ON 11/23/2016] pneumococcal 23 valent vaccine  0.5 mL Intramuscular Tomorrow-1000  . sodium polystyrene  15 g Oral Once   Continuous Infusions: . dextrose 5 % and 0.45% NaCl 75 mL/hr at 11/22/16 0027     LOS: 0 days    Time spent: 35 minutes.     Elmarie Shiley, MD [facility] Pager [phone]  If 7PM-7AM, please contact night-coverage [email or web address] Password [email or web address] 11/22/2016, 8:58 AM

## 2016-11-22 NOTE — Care Management Note (Signed)
Case Management Note  Patient Details  Name: [name] [name] MRN: [id number] Date of Birth: 07/25/1955  Subjective/Objective:                  61 y.o. female with medical history significant of CKD, DM2, HTN.  Patient sent in by [facility] after several days of persistent hypoglycemia at home.  CBG in 20s-30s.   ED Course: Given D50 and started on D5.  Creat 1.9 (patient reports CKD), K 6.0 (unclear if hemolyzed).  Patient also has skin ulcers that she just got started on clinda for today.  Action/Plan: Date:  November 22, 2016 Chart reviewed for concurrent status and case management needs.  Will continue to follow patient progress.  Discharge Planning: following for needs  Expected discharge date: November 25, 2016  Velva Harman, BSN, [place], [place]   Expected Discharge Date:  11/22/16               Expected Discharge Plan:  Home/Self Care  In-House Referral:     Discharge planning Services  CM Consult  Post Acute Care Choice:    Choice offered to:     DME Arranged:    DME Agency:     HH Arranged:    HH Agency:     Status of Service:  In process, will continue to follow  If discussed at Long Length of Stay Meetings, dates discussed:    Additional Comments:  Leeroy Cha, RN 11/22/2016, 9:22 AM

## 2016-11-23 DIAGNOSIS — N184 Chronic kidney disease, stage 4 (severe): Secondary | ICD-10-CM

## 2016-11-23 DIAGNOSIS — E160 Drug-induced hypoglycemia without coma: Secondary | ICD-10-CM

## 2016-11-23 DIAGNOSIS — D649 Anemia, unspecified: Secondary | ICD-10-CM | POA: Diagnosis not present

## 2016-11-23 DIAGNOSIS — T383X1A Poisoning by insulin and oral hypoglycemic [antidiabetic] drugs, accidental (unintentional), initial encounter: Secondary | ICD-10-CM

## 2016-11-23 DIAGNOSIS — E11649 Type 2 diabetes mellitus with hypoglycemia without coma: Secondary | ICD-10-CM

## 2016-11-23 LAB — BASIC METABOLIC PANEL WITH GFR
Anion gap: 8 (ref 5–15)
BUN: 40 mg/dL — ABNORMAL HIGH (ref 6–20)
CO2: 22 mmol/L (ref 22–32)
Calcium: 9.4 mg/dL (ref 8.9–10.3)
Chloride: 109 mmol/L (ref 101–111)
Creatinine, Ser: 1.49 mg/dL — ABNORMAL HIGH (ref 0.44–1.00)
GFR calc Af Amer: 42 mL/min — ABNORMAL LOW
GFR calc non Af Amer: 36 mL/min — ABNORMAL LOW
Glucose, Bld: 133 mg/dL — ABNORMAL HIGH (ref 65–99)
Potassium: 4.7 mmol/L (ref 3.5–5.1)
Sodium: 139 mmol/L (ref 135–145)

## 2016-11-23 LAB — HEPATIC FUNCTION PANEL
ALT: 16 U/L (ref 14–54)
AST: 19 U/L (ref 15–41)
Albumin: 3.5 g/dL (ref 3.5–5.0)
Alkaline Phosphatase: 72 U/L (ref 38–126)
Bilirubin, Direct: <0.1 mg/dL — ABNORMAL LOW (ref 0.1–0.5)
Total Bilirubin: 0.5 mg/dL (ref 0.3–1.2)
Total Protein: 6.7 g/dL (ref 6.5–8.1)

## 2016-11-23 LAB — GLUCOSE, CAPILLARY
GLUCOSE-CAPILLARY: 161 mg/dL — AB (ref 65–99)
GLUCOSE-CAPILLARY: 164 mg/dL — AB (ref 65–99)
Glucose-Capillary: 141 mg/dL — ABNORMAL HIGH (ref 65–99)
Glucose-Capillary: 149 mg/dL — ABNORMAL HIGH (ref 65–99)
Glucose-Capillary: 178 mg/dL — ABNORMAL HIGH (ref 65–99)
Glucose-Capillary: 207 mg/dL — ABNORMAL HIGH (ref 65–99)
Glucose-Capillary: 290 mg/dL — ABNORMAL HIGH (ref 65–99)

## 2016-11-23 LAB — CBC
HCT: 29.4 % — ABNORMAL LOW (ref 36.0–46.0)
Hemoglobin: 9.4 g/dL — ABNORMAL LOW (ref 12.0–15.0)
MCH: 25.9 pg — ABNORMAL LOW (ref 26.0–34.0)
MCHC: 32 g/dL (ref 30.0–36.0)
MCV: 81 fL (ref 78.0–100.0)
Platelets: 431 10*3/uL — ABNORMAL HIGH (ref 150–400)
RBC: 3.63 MIL/uL — ABNORMAL LOW (ref 3.87–5.11)
RDW: 15.5 % (ref 11.5–15.5)
WBC: 8.8 10*3/uL (ref 4.0–10.5)

## 2016-11-23 MED ORDER — DIPHENHYDRAMINE HCL 50 MG/ML IJ SOLN
12.5000 mg | Freq: Three times a day (TID) | INTRAMUSCULAR | Status: DC | PRN
  Administered 2016-11-23 – 2016-11-24 (×3): 12.5 mg via INTRAVENOUS
  Filled 2016-11-23 (×3): qty 1

## 2016-11-23 MED ORDER — UREA 10 % EX LOTN
TOPICAL_LOTION | Freq: Three times a day (TID) | CUTANEOUS | Status: DC | PRN
  Administered 2016-11-24: 01:00:00 via TOPICAL
  Filled 2016-11-23: qty 237

## 2016-11-23 MED ORDER — HYDROCERIN EX CREA
TOPICAL_CREAM | Freq: Two times a day (BID) | CUTANEOUS | Status: DC | PRN
  Administered 2016-11-23: 22:00:00 via TOPICAL
  Filled 2016-11-23: qty 113

## 2016-11-23 MED ORDER — OXYCODONE HCL 5 MG PO TABS
5.0000 mg | ORAL_TABLET | Freq: Once | ORAL | Status: AC
  Administered 2016-11-23: 5 mg via ORAL
  Filled 2016-11-23: qty 1

## 2016-11-23 MED ORDER — GABAPENTIN 400 MG PO CAPS
800.0000 mg | ORAL_CAPSULE | Freq: Two times a day (BID) | ORAL | Status: DC
  Administered 2016-11-23 – 2016-11-24 (×3): 800 mg via ORAL
  Filled 2016-11-23 (×3): qty 2

## 2016-11-23 MED ORDER — HYDROCORTISONE 1 % EX CREA
TOPICAL_CREAM | Freq: Two times a day (BID) | CUTANEOUS | Status: DC
  Administered 2016-11-23 (×2): via TOPICAL
  Administered 2016-11-24: 1 via TOPICAL
  Filled 2016-11-23: qty 28

## 2016-11-23 MED ORDER — POLYVINYL ALCOHOL 1.4 % OP SOLN
1.0000 [drp] | OPHTHALMIC | Status: DC | PRN
  Filled 2016-11-23: qty 15

## 2016-11-23 NOTE — Progress Notes (Signed)
[facility] PROGRESS NOTE  [name] [name] NTZ:[id number] DOB: 01-08-1956 DOA: 11/21/2016  PCP: System, Pcp Not In  Brief History/Interval Summary: 61 year old African-American female with past medical history of chronic kidney disease, diabetes mellitus type 2, hypertension, was hospitalized due to persistent hypoglycemia.  Reason for Visit: Hypoglycemia  Consultants: None  Procedures: None  Antibiotics: None  Subjective/Interval History: Patient complains of severe neuropathic pain in her feet. She is crying out in pain. She mentions that she used to take 800 mg of gabapentin 3 times a day. However, about 3-4 weeks ago the dose was reduced to 600 mg once a day. Since then patient has been experiencing worsening pain in her feet.  ROS: She denies any nausea, vomiting.  Objective:  Vital Signs  Vitals:   11/22/16 1423 11/22/16 2049 11/23/16 0453 11/23/16 1355  BP: (!) 120/49 (!) 143/51 133/66 (!) 105/49  Pulse: (!) 102 (!) 102 (!) 104 100  Resp: 18 18 18 18   Temp: 98.7 F (37.1 C) 98.2 F (36.8 C) 98.1 F (36.7 C) 97.9 F (36.6 C)  TempSrc: Oral  Oral Oral  SpO2: 100% 97% 98% 97%  Weight:      Height:        Intake/Output Summary (Last 24 hours) at 11/23/16 1428 Last data filed at 11/23/16 1356  Gross per 24 hour  Intake           1442.5 ml  Output                0 ml  Net           1442.5 ml   Filed Weights   11/21/16 1901  Weight: 90.7 kg (200 lb)    General appearance: alert, distracted and Crying out in pain and not cooperative at times Head: Normocephalic, without obvious abnormality, atraumatic Resp: clear to auscultation bilaterally Cardio: regular rate and rhythm, S1, S2 normal, no murmur, click, rub or gallop GI: soft, non-tender; bowel sounds normal; no masses,  no organomegaly Extremities: No obvious lesions noted in her hand or feet. She has good peripheral pulses including bilateral dorsalis pedis pulses. Neurologic: Oriented 3. No  focal neurological deficits  Lab Results:  Data Reviewed: I have personally reviewed following labs and imaging studies  CBC:  Recent Labs Lab 11/21/16 2148 11/23/16 0557  WBC 9.4 8.8  NEUTROABS 5.4  --   HGB 10.8* 9.4*  HCT 34.1* 29.4*  MCV 80.4 81.0  PLT 552* 431*    Basic Metabolic Panel:  Recent Labs Lab 11/21/16 2148 11/22/16 0001 11/22/16 0726 11/22/16 1418 11/23/16 0557  NA 141  --  140 138 139  K 6.0* 6.1* 5.8* 5.2* 4.7  CL 109  --  112* 104 109  CO2 22  --  19* 25 22  GLUCOSE 67  --  90 212* 133*  BUN 57*  --  54* 50* 40*  CREATININE 1.97*  --  1.87* 1.74* 1.49*  CALCIUM 9.4  --  9.1 9.8 9.4    GFR: Estimated Creatinine Clearance: 40.4 mL/min (A) (by C-G formula based on SCr of 1.49 mg/dL (H)).  Liver Function Tests:  Recent Labs Lab 11/23/16 0557  AST 19  ALT 16  ALKPHOS 72  BILITOT 0.5  PROT 6.7  ALBUMIN 3.5    CBG:  Recent Labs Lab 11/22/16 2048 11/23/16 0052 11/23/16 0452 11/23/16 0727 11/23/16 1128  GLUCAP 238* 161* 141* 164* 207*      Radiology Studies: No results found.   Medications:  Scheduled: . furosemide  40 mg Oral Daily  . gabapentin  800 mg Oral BID  . heparin  5,000 Units Subcutaneous Q8H  . hydrocortisone cream   Topical BID  . Influenza vac split quadrivalent PF  0.5 mL Intramuscular Tomorrow-1000  . pneumococcal 23 valent vaccine  0.5 mL Intramuscular Tomorrow-1000  . sodium bicarbonate  650 mg Oral BID   Continuous:  BSW:[id number] **OR** acetaminophen, ALPRAZolam, diphenhydrAMINE, hydrocerin, hydrOXYzine, ondansetron **OR** ondansetron (ZOFRAN) IV, polyvinyl alcohol, urea, zolpidem  Assessment/Plan:  Principal Problem:   Hypoglycemia secondary to sulfonylurea Active Problems:   Diabetes type 2, uncontrolled (HCC)   CKD (chronic kidney disease) stage 4, GFR 15-29 ml/min (HCC)    Hypoglycemia in the setting of known type 2 diabetes. Patient mentions that her home medication doses were  increased recently. That along with the fact that she had slight worsening of her renal function could be the reason for her hypoglycemia. Patient was given D50 and was started on D5 infusion. D5 was discontinued yesterday. CBGs have stabilized and now in the hyperglycemic range. Continue to monitor for now. No recent HbA1c in our system. We will order one for tomorrow morning. Consider reinitiating her oral hypoglycemic agents but at a lower dose if CBGs remain consistently elevated.  Peripheral neuropathy with severe neuropathic pain in the lower extremities. Patient mentioned that she is experiencing severe pain in her lower extremities. Her dose of gabapentin was recently decreased. We will increase her Neurontin since her renal function appears to be improving/stable. Give her 1 dose of OxyIR. Patient was reassured. She does have good peripheral pulses.  Mild acute renal failure with Chronic kidney disease stage IV with hyperkalemia. Creatinine has slowly improved. Potassium level has improved with Kayexalate. Continue to monitor for now. Monitor urine output. She was found to have mild metabolic acidosis and was started on oral bicarbonate.  Small ulcer, right shoulder. Recently started on clindamycin by the [place]. This is a shallow ulcer. No evidence for infection. Discontinue systemic antibiotics. Local wound care.  Normocytic anemia. Hemoglobin is stable. No evidence for overt bleeding.   DVT Prophylaxis: Subcutaneous heparin    Code Status: Full code  Family Communication: Discussed with the patient  Disposition Plan: Mobilize as tolerated. Anticipate discharge in 1-2 days.   LOS: 0 days   [facility] Pager [phone] 11/23/2016, 2:28 PM  If 7PM-7AM, please contact night-coverage at [email or web address], password [facility]

## 2016-11-23 NOTE — Progress Notes (Signed)
Nutrition Education Note  RD consulted for Renal Education. Provided Renal food pyramid handout to patient/family. Reviewed food groups and provided written recommended serving sizes specifically determined for patient's current nutritional status.   Explained why diet restrictions are needed and provided lists of foods to limit/avoid that are high potassium, sodium, and phosphorus. Pt unsure of what phosphorus is. Provided specific recommendations on safer alternatives of these foods. Also reviewed foods to avoid upon discharge. Strongly encouraged compliance of this diet.   Discussed importance of protein intake at each meal and snack.  Discussed need for fluid restriction with dialysis. Pt reports "I don't know if I'll live to do dialysis" Of note, pt with episodes of hypoglycemia.   Education slightly abbreviated given pt's discomfort. Pt complaining of neuropathy and lack of medicine at time of visit. Pt reports "I am out of my comfort zone"  Encouraged pt to discuss specific diet questions/concerns with RD at HD outpatient facility. Teach back method used.  Expect poor compliance.  Body mass index is 36.58 kg/m. Pt meets criteria for obese based on current BMI.  Current diet order is renal with 1.2 L fluid restriction, patient is consuming approximately 100% of meals at this time. Labs and medications reviewed. No further nutrition interventions warranted at this time. RD contact information provided. If additional nutrition issues arise, please re-consult RD.  [place] Ranger, MS, RDN, LDN 11/23/2016 10:29 AM

## 2016-11-24 DIAGNOSIS — N184 Chronic kidney disease, stage 4 (severe): Secondary | ICD-10-CM | POA: Diagnosis not present

## 2016-11-24 LAB — GLUCOSE, CAPILLARY
GLUCOSE-CAPILLARY: 150 mg/dL — AB (ref 65–99)
GLUCOSE-CAPILLARY: 214 mg/dL — AB (ref 65–99)

## 2016-11-24 LAB — CBC
HEMATOCRIT: 29 % — AB (ref 36.0–46.0)
Hemoglobin: 9.2 g/dL — ABNORMAL LOW (ref 12.0–15.0)
MCH: 25.8 pg — AB (ref 26.0–34.0)
MCHC: 31.7 g/dL (ref 30.0–36.0)
MCV: 81.5 fL (ref 78.0–100.0)
Platelets: 416 10*3/uL — ABNORMAL HIGH (ref 150–400)
RBC: 3.56 MIL/uL — AB (ref 3.87–5.11)
RDW: 15.5 % (ref 11.5–15.5)
WBC: 9.5 10*3/uL (ref 4.0–10.5)

## 2016-11-24 LAB — HEMOGLOBIN A1C
Hgb A1c MFr Bld: 5.9 % — ABNORMAL HIGH (ref 4.8–5.6)
Mean Plasma Glucose: 122.63 mg/dL

## 2016-11-24 LAB — BASIC METABOLIC PANEL
Anion gap: 8 (ref 5–15)
BUN: 43 mg/dL — AB (ref 6–20)
CHLORIDE: 106 mmol/L (ref 101–111)
CO2: 24 mmol/L (ref 22–32)
CREATININE: 1.65 mg/dL — AB (ref 0.44–1.00)
Calcium: 9.3 mg/dL (ref 8.9–10.3)
GFR calc Af Amer: 37 mL/min — ABNORMAL LOW (ref >60)
GFR calc non Af Amer: 32 mL/min — ABNORMAL LOW (ref >60)
Glucose, Bld: 161 mg/dL — ABNORMAL HIGH (ref 65–99)
Potassium: 4.5 mmol/L (ref 3.5–5.1)
Sodium: 138 mmol/L (ref 135–145)

## 2016-11-24 MED ORDER — GLIPIZIDE 5 MG PO TABS: 5.0000 mg | ORAL_TABLET | Freq: Every day | ORAL | 0 refills | Status: DC

## 2016-11-24 MED ORDER — DIPHENHYDRAMINE HCL 12.5 MG/5ML PO ELIX: 12.5000 mg | ORAL_SOLUTION | Freq: Three times a day (TID) | ORAL | Status: DC | PRN

## 2016-11-24 MED ORDER — OXYCODONE HCL 5 MG PO TABS: 5.0000 mg | ORAL_TABLET | Freq: Two times a day (BID) | ORAL | 0 refills | Status: DC | PRN

## 2016-11-24 MED ORDER — GABAPENTIN 800 MG PO TABS: 800.0000 mg | ORAL_TABLET | Freq: Three times a day (TID) | ORAL | 0 refills | Status: DC

## 2016-11-24 NOTE — Progress Notes (Signed)
PHARMACIST - PHYSICIAN COMMUNICATION  CONCERNING: IV to Oral Route Change Policy  RECOMMENDATION: This patient is receiving diphenhydramine by the intravenous route.  Based on criteria approved by the Pharmacy and Therapeutics Committee, intravenous diphenhydramine is being converted to the equivalent oral dose form(s).   DESCRIPTION: These criteria include:  Diphenhydramine is not prescribed to treat or prevent a severe allergic reaction  Diphenhydramine is not prescribed as premedication prior to receiving blood product, biologic medication, antimicrobial, or chemotherapy agent  The patient has tolerated at least one dose of an oral or enteral medication  The patient has no evidence of active gastrointestinal bleeding or impaired GI absorption (gastrectomy, short bowel, patient on TNA or NPO).  The patient is not undergoing procedural sedation   If you have questions about this conversion, please contact the Pharmacy Department  []   [phone] )  [facility] []   [phone] )  [facility] []   [phone] )  [facility] []   [phone] )  [facility] [x]   [phone] )  [place], PharmD Candidate

## 2016-11-24 NOTE — Progress Notes (Signed)
November 24, 2016 Chart and discharge orders researched for Case Management needs. None found and patient discharged to appropriate level of care. Patient and family have no further questions. Velva Harman, BSN, [place], [place].

## 2016-11-24 NOTE — Progress Notes (Signed)
Went over discharge instructions with patient. IV site was removed. Prescriptions given to patient and aware on how to take them going forward. Awaiting ride for home.

## 2016-11-24 NOTE — Progress Notes (Signed)
MD asked to walk patient to assess dizziness and HR with ambulation. Patient refused to ambulating stating " I came in a wheelchair and I am going to leave in a wheelchair' Patient states that her leg is "bad". Informed MD

## 2016-11-24 NOTE — Discharge Summary (Addendum)
[facility]  Physician Discharge Summary   Patient ID: [name] [name] MRN: [id number] DOB/AGE: 1955/06/11 61 y.o.  Admit date: 11/21/2016 Discharge date: 11/24/2016  PCP: System, Pcp Not In  DISCHARGE DIAGNOSES:  Principal Problem:   Hypoglycemia secondary to sulfonylurea Active Problems:   Diabetes type 2, uncontrolled (HCC)   CKD (chronic kidney disease) stage 4, GFR 15-29 ml/min (HCC)   RECOMMENDATIONS FOR OUTPATIENT FOLLOW UP: 1. Patient instructed to follow-up with her primary care provider within the week for further management of her diabetes. 2. And also to follow-up for her severe diabetic neuropathy 3. Please note changes to her diabetes medication regimen as discussed below.   DISCHARGE CONDITION: fair  Diet recommendation: Modified carbohydrate  Filed Weights   11/21/16 1901  Weight: 90.7 kg (200 lb)    INITIAL HISTORY: 61 year old African-American female with past medical history of chronic kidney disease, diabetes mellitus type 2, hypertension, was hospitalized due to persistent hypoglycemia.   HOSPITAL COURSE:   Hypoglycemia in the setting of known type 2 diabetes. Patient mentioned that her home medication doses were increased recently. That along with the fact that she had slight worsening of her renal function could be the reason for her hypoglycemia. Patient was given D50 and was started on D5 infusion. Her CBGs improved. D5 was discontinued. CBGs are stable on oral intake. CBGs running between 150 to 290. She will be asked to resume her glipizide but take it only once a day. She'll be asked to not continue with her Actos. She checks her blood glucose levels at home regularly. She will follow-up with her primary care provider. HbA1c is 5.9, which could suggest that patient's diabetes was perhaps being treated too aggressively.   Peripheral neuropathy with severe neuropathic pain in the lower extremities. Patient mentioned that she is  experiencing severe pain in her lower extremities. Her symptoms were suggestive of neuropathy. Her dose of gabapentin was recently decreased. Due to severe symptoms gabapentin dose was increased. She also had to be given narcotics with improvement in her symptoms. She will be asked to follow-up with her primary care provider for further management.   Mild acute renal failure with Chronic kidney disease stage IV with hyperkalemia. Renal function is stable. She has good urine output. Potassium level improved with Kayexalate. Metabolic acidosis has resolved. Outpatient follow-up with PCP.  Small ulcer, right shoulder. Recently started on clindamycin by the [place]. This is a shallow ulcer. No evidence for infection. Discontinue systemic antibiotics. Local wound care.  Normocytic anemia. Hemoglobin is stable. No evidence for overt bleeding.  Essential hypertension. Blood pressure was borderline low. To avoid further drop in BP her ACE inhibitor has been discontinued.  She did experience some dizziness this morning, which according to the patient is chronic for her. She does not really ambulated much due to her neuropathy. Orthostatics were checked. Blood pressure remained stable. However, heart rate did decline when she stood up. Heart rate came back down to normal very quickly. She was asked to get up slowly from a sitting or lying position. Patient could have some degree of autonomic dysfunction as well.   Overall stable. Okay for discharge.  ADDENDUM CALLED BY HER PHARMACY THAT PATIENT GETS PERCOCET FILLED EVERY MONTH. SHE WAS RECENTLY PRESCRIBED 90 TABLETS ON 11/09/16. PATIENT HAD TOLD ME THAT SHE DID NOT GET NARCOTICS AND HER HOME MEDICATION LIST DID NOT LIST ANY NARCOTICS. I REQUESTED THE PHARMACIST TO SHRED MY PRESCRIPTION.   PERTINENT LABS:  The results of significant diagnostics  from this hospitalization (including imaging, microbiology, ancillary and laboratory) are listed below for  reference.     Labs: Basic Metabolic Panel:  Recent Labs Lab 11/21/16 2148 11/22/16 0001 11/22/16 0726 11/22/16 1418 11/23/16 0557 11/24/16 0516  NA 141  --  140 138 139 138  K 6.0* 6.1* 5.8* 5.2* 4.7 4.5  CL 109  --  112* 104 109 106  CO2 22  --  19* 25 22 24   GLUCOSE 67  --  90 212* 133* 161*  BUN 57*  --  54* 50* 40* 43*  CREATININE 1.97*  --  1.87* 1.74* 1.49* 1.65*  CALCIUM 9.4  --  9.1 9.8 9.4 9.3   Liver Function Tests:  Recent Labs Lab 11/23/16 0557  AST 19  ALT 16  ALKPHOS 72  BILITOT 0.5  PROT 6.7  ALBUMIN 3.5   CBC:  Recent Labs Lab 11/21/16 2148 11/23/16 0557 11/24/16 0516  WBC 9.4 8.8 9.5  NEUTROABS 5.4  --   --   HGB 10.8* 9.4* 9.2*  HCT 34.1* 29.4* 29.0*  MCV 80.4 81.0 81.5  PLT 552* 431* 416*    CBG:  Recent Labs Lab 11/23/16 1623 11/23/16 2043 11/23/16 2335 11/24/16 0434 11/24/16 0747  GLUCAP 178* 290* 149* 150* 214*    DISCHARGE EXAMINATION: Vitals:   11/23/16 1355 11/23/16 2039 11/24/16 0434 11/24/16 0856  BP: (!) 105/49 118/64 (!) 107/58 (!) 107/45  Pulse: 100 (!) 104 89 (!) 104  Resp: 18 20 20    Temp: 97.9 F (36.6 C) 98.8 F (37.1 C) 98.5 F (36.9 C)   TempSrc: Oral Oral Oral   SpO2: 97% 100% 96%   Weight:      Height:       General appearance: alert, cooperative, appears stated age and no distress Resp: clear to auscultation bilaterally Cardio: regular rate and rhythm, S1, S2 normal, no murmur, click, rub or gallop GI: soft, non-tender; bowel sounds normal; no masses,  no organomegaly  DISPOSITION: Home  Discharge Instructions    Call MD for:  difficulty breathing, headache or visual disturbances    Complete by:  As directed    Call MD for:  extreme fatigue    Complete by:  As directed    Call MD for:  persistant dizziness or light-headedness    Complete by:  As directed    Call MD for:  persistant nausea and vomiting    Complete by:  As directed    Call MD for:  severe uncontrolled pain    Complete  by:  As directed    Call MD for:  temperature >100.4    Complete by:  As directed    Diet Carb Modified    Complete by:  As directed    Discharge instructions    Complete by:  As directed    Please be sure to follow-up with your primary care provider within one week. Please take your diabetes medications as instructed. Check blood glucose levels 3 times a day before each meal and maintain a log for your PCP. If the blood glucose levels are less than 100, stop taking the glipizide. Please get up slowly from a lying or sitting position to avoid sudden drop in blood pressure.  You were cared for by a hospitalist during your hospital stay. If you have any questions about your discharge medications or the care you received while you were in the hospital after you are discharged, you can call the unit and asked to speak with  the hospitalist on call if the hospitalist that took care of you is not available. Once you are discharged, your primary care physician will handle any further medical issues. Please note that NO REFILLS for any discharge medications will be authorized once you are discharged, as it is imperative that you return to your primary care physician (or establish a relationship with a primary care physician if you do not have one) for your aftercare needs so that they can reassess your need for medications and monitor your lab values. If you do not have a primary care physician, you can call [phone] for a physician referral.   Increase activity slowly    Complete by:  As directed       ALLERGIES:  Allergies  Allergen Reactions  . Flexeril [Cyclobenzaprine] Other (See Comments)    Causes her sugar to elevate  . Morphine And Related Itching  . Penicillins Itching and Nausea And Vomiting    Has patient had a PCN reaction causing immediate rash, facial/tongue/throat swelling, SOB or lightheadedness with hypotension: No Has patient had a PCN reaction causing severe rash involving mucus  membranes or skin necrosis: No Has patient had a PCN reaction that required hospitalization: No Has patient had a PCN reaction occurring within the last 10 years: No If all of the above answers are "NO", then may proceed with Cephalosporin use.      Discharge Medication List as of 11/24/2016 11:09 AM    START taking these medications   Details  oxyCODONE (OXY IR/ROXICODONE) 5 MG immediate release tablet Take 1 tablet (5 mg total) by mouth 2 (two) times daily as needed for severe pain., Starting Thu 11/24/2016, Print      CONTINUE these medications which have CHANGED   Details  gabapentin (NEURONTIN) 800 MG tablet Take 1 tablet (800 mg total) by mouth 3 (three) times daily., Starting Thu 11/24/2016, Print    glipiZIDE (GLUCOTROL) 5 MG tablet Take 1 tablet (5 mg total) by mouth daily before breakfast., Starting Thu 11/24/2016, Print      CONTINUE these medications which have NOT CHANGED   Details  ALPRAZolam (XANAX) 0.5 MG tablet Take 0.5 mg by mouth daily as needed for anxiety. , Starting Fri 05/23/2014, Historical Med    furosemide (LASIX) 20 MG tablet Take 40 mg by mouth daily. , Starting Mon 11/24/2014, Historical Med    hydrOXYzine (VISTARIL) 50 MG capsule TAKE ONE CAPSULE BY MOUTH EVERY 6 HOURS AS NEEDED, Print    zolpidem (AMBIEN) 10 MG tablet Take 10 mg by mouth at bedtime as needed for sleep. , Starting Fri 05/23/2014, Historical Med      STOP taking these medications     clindamycin (CLEOCIN) 150 MG capsule      lisinopril (PRINIVIL,ZESTRIL) 40 MG tablet      pioglitazone (ACTOS) 30 MG tablet         TOTAL DISCHARGE TIME: 35 minutes  [place] [facility] Pager [phone]  11/24/2016, 1:57 PM

## 2016-11-24 NOTE — Discharge Instructions (Signed)
Diabetic Neuropathy Diabetic neuropathy is a nerve disease or nerve damage that is caused by diabetes mellitus. About half of all people with diabetes mellitus have some form of nerve damage. Nerve damage is more common in those who have had diabetes mellitus for many years and who generally have not had good control of their blood sugar (glucose) level. Diabetic neuropathy is a common complication of diabetes mellitus. There are three common types of diabetic neuropathy and a fourth type that is less common and less understood:  Peripheral neuropathy-This is the most common type of diabetic neuropathy. It causes damage to the nerves of the feet and legs first and then eventually the hands and arms. The damage affects the ability to sense touch.  Autonomic neuropathy-This type causes damage to the autonomic nervous system, which controls the following functions: ? Heartbeat. ? Body temperature. ? Blood pressure. ? Urination. ? Digestion. ? Sweating. ? Sexual function.  Focal neuropathy-Focal neuropathy can be painful and unpredictable and occurs most often in older adults with diabetes mellitus. It involves a specific nerve or one area and often comes on suddenly. It usually does not cause long-term problems.  Radiculoplexus neuropathy- Sometimes called lumbosacral radiculoplexus neuropathy, radiculoplexus neuropathy affects the nerves of the thighs, hips, buttocks, or legs. It is more common in people with type 2 diabetes mellitus and in older men. It is characterized by debilitating pain, weakness, and atrophy, usually in the thigh muscles.  What are the causes? The cause of peripheral, autonomic, and focal neuropathies is diabetes mellitus that is uncontrolled and high glucose levels. The cause of radiculoplexus neuropathy is unknown. However, it is thought to be caused by inflammation related to uncontrolled glucose levels. What are the signs or symptoms? Peripheral Neuropathy Peripheral  neuropathy develops slowly over time. When the nerves of the feet and legs no longer work there may be:  Burning, stabbing, or aching pain in the legs or feet.  Inability to feel pressure or pain in your feet. This can lead to: ? Thick calluses over pressure areas. ? Pressure sores. ? Ulcers.  Foot deformities.  Reduced ability to feel temperature changes.  Muscle weakness.  Autonomic Neuropathy The symptoms of autonomic neuropathy vary depending on which nerves are affected. Symptoms may include:  Problems with digestion, such as: ? Feeling sick to your stomach (nausea). ? Vomiting. ? Bloating. ? Constipation. ? Diarrhea. ? Abdominal pain.  Difficulty with urination. This occurs if you lose your ability to sense when your bladder is full. Problems include: ? Urine leakage (incontinence). ? Inability to empty your bladder completely (retention).  Rapid or irregular heartbeat (palpitations).  Blood pressure drops when you stand up (orthostatic hypotension). When you stand up you may feel: ? Dizzy. ? Weak. ? Faint.  In men, inability to attain and maintain an erection.  In women, vaginal dryness and problems with decreased sexual desire and arousal.  Problems with body temperature regulation.  Increased or decreased sweating.  Focal Neuropathy  Abnormal eye movements or abnormal alignment of both eyes.  Weakness in the wrist.  Foot drop. This results in an inability to lift the foot properly and abnormal walking or foot movement.  Paralysis on one side of your face (Bell palsy).  Chest or abdominal pain. Radiculoplexus Neuropathy  Sudden, severe pain in your hip, thigh, or buttocks.  Weakness and wasting of thigh muscles.  Difficulty rising from a seated position.  Abdominal swelling.  Unexplained weight loss (usually more than 10 lb [4.5 kg]). How is   this diagnosed? Peripheral Neuropathy Your senses may be tested. Sensory function testing can be  done with:  A light touch using a monofilament.  A vibration with tuning fork.  A sharp sensation with a pin prick.  Other tests that can help diagnose neuropathy are:  Nerve conduction velocity. This test checks the transmission of an electrical current through a nerve.  Electromyography. This shows how muscles respond to electrical signals transmitted by nearby nerves.  Quantitative sensory testing. This is used to assess how your nerves respond to vibrations and changes in temperature.  Autonomic Neuropathy Diagnosis is often based on reported symptoms. Tell your health care provider if you experience:  Dizziness.  Constipation.  Diarrhea.  Inappropriate urination or inability to urinate.  Inability to get or maintain an erection.  Tests that may be done include:  Electrocardiography or Holter monitor. These are tests that can help show problems with the heart rate or heart rhythm.  An X-ray exam may be done.  Focal Neuropathy Diagnosis is made based on your symptoms and what your health care provider finds during your exam. Other tests may be done. They may include:  Nerve conduction velocities. This checks the transmission of electrical current through a nerve.  Electromyography. This shows how muscles respond to electrical signals transmitted by nearby nerves.  Quantitative sensory testing. This test is used to assess how your nerves respond to vibration and changes in temperature.  Radiculoplexus Neuropathy  Often the first thing is to eliminate any other issue or problems that might be the cause, as there is no standard test for diagnosis.  X-ray exam of your spine and lumbar region.  Spinal tap to rule out cancer.  MRI to rule out other lesions. How is this treated? Once nerve damage occurs, it cannot be reversed. The goal of treatment is to keep the disease or nerve damage from getting worse and affecting more nerve fibers. Controlling your blood  glucose level is the key. Most people with radiculoplexus neuropathy see at least a partial improvement over time. You will need to keep your blood glucose and HbA1c levels in the target range determined by your health care provider. Things that help control blood glucose levels include:  Blood glucose monitoring.  Meal planning.  Physical activity.  Diabetes medicine.  Over time, maintaining lower blood glucose levels helps lessen symptoms. Sometimes, prescription pain medicine is needed. Follow these instructions at home:  Do not smoke.  Keep your blood glucose level in the range that you and your health care provider have determined acceptable for you.  Keep your blood pressure level in the range that you and your health care provider have determined acceptable for you.  Eat a well-balanced diet.  Be physically active every day. Include strength training and balance exercises.  Protect your feet. ? Check your feet every day for sores, cuts, blisters, or signs of infection. ? Wear padded socks and supportive shoes. Use orthotic inserts, if necessary. ? Regularly check the insides of your shoes for worn spots. Make sure there are no rocks or other items inside your shoes before you put them on. Contact a health care provider if:  You have burning, stabbing, or aching pain in the legs or feet.  You are unable to feel pressure or pain in your feet.  You develop problems with digestion such as: ? Nausea. ? Vomiting. ? Bloating. ? Constipation. ? Diarrhea. ? Abdominal pain.  You have difficulty with urination, such as: ? Incontinence. ? Retention.    You have palpitations.  You develop orthostatic hypotension. When you stand up you may feel: ? Dizzy. ? Weak. ? Faint.  You cannot attain and maintain an erection (in men).  You have vaginal dryness and problems with decreased sexual desire and arousal (in women).  You have severe pain in your thighs, legs, or  buttocks.  You have unexplained weight loss. This information is not intended to replace advice given to you by your health care provider. Make sure you discuss any questions you have with your health care provider. Document Released: 04/18/2001 Document Revised: 07/16/2015 Document Reviewed: 07/19/2012 Elsevier Interactive Patient Education  2017 [employer]  

## 2017-06-29 ENCOUNTER — Encounter (HOSPITAL_BASED_OUTPATIENT_CLINIC_OR_DEPARTMENT_OTHER): Payer: Medicare Other | Attending: Internal Medicine

## 2017-06-29 ENCOUNTER — Other Ambulatory Visit (HOSPITAL_COMMUNITY)
Admit: 2017-06-29 | Discharge: 2017-06-29 | Disposition: A | Discharge status: Home / Self Care | Payer: Medicare Other | Source: Ambulatory Visit | Attending: Internal Medicine | Admitting: Internal Medicine

## 2017-06-29 DIAGNOSIS — I890 Lymphedema, not elsewhere classified: Secondary | ICD-10-CM | POA: Diagnosis not present

## 2017-06-29 DIAGNOSIS — I120 Hypertensive chronic kidney disease with stage 5 chronic kidney disease or end stage renal disease: Secondary | ICD-10-CM | POA: Insufficient documentation

## 2017-06-29 DIAGNOSIS — T8131XA Disruption of external operation (surgical) wound, not elsewhere classified, initial encounter: Secondary | ICD-10-CM | POA: Insufficient documentation

## 2017-06-29 DIAGNOSIS — B9561 Methicillin susceptible Staphylococcus aureus infection as the cause of diseases classified elsewhere: Secondary | ICD-10-CM | POA: Diagnosis not present

## 2017-06-29 DIAGNOSIS — E1140 Type 2 diabetes mellitus with diabetic neuropathy, unspecified: Secondary | ICD-10-CM | POA: Insufficient documentation

## 2017-06-29 DIAGNOSIS — S31104A Unspecified open wound of abdominal wall, left lower quadrant without penetration into peritoneal cavity, initial encounter: Secondary | ICD-10-CM | POA: Diagnosis not present

## 2017-06-29 DIAGNOSIS — N186 End stage renal disease: Secondary | ICD-10-CM | POA: Diagnosis not present

## 2017-06-29 DIAGNOSIS — M71011 Abscess of bursa, right shoulder: Secondary | ICD-10-CM | POA: Insufficient documentation

## 2017-06-29 DIAGNOSIS — M069 Rheumatoid arthritis, unspecified: Secondary | ICD-10-CM | POA: Diagnosis not present

## 2017-06-29 DIAGNOSIS — E1122 Type 2 diabetes mellitus with diabetic chronic kidney disease: Secondary | ICD-10-CM | POA: Insufficient documentation

## 2017-06-29 DIAGNOSIS — G4730 Sleep apnea, unspecified: Secondary | ICD-10-CM | POA: Diagnosis not present

## 2017-06-29 DIAGNOSIS — Y838 Other surgical procedures as the cause of abnormal reaction of the patient, or of later complication, without mention of misadventure at the time of the procedure: Secondary | ICD-10-CM | POA: Diagnosis not present

## 2017-06-29 LAB — GLUCOSE, CAPILLARY: GLUCOSE-CAPILLARY: 226 mg/dL — AB (ref 65–99)

## 2017-07-05 LAB — AEROBIC CULTURE W GRAM STAIN (SUPERFICIAL SPECIMEN)

## 2017-07-05 LAB — AEROBIC CULTURE  (SUPERFICIAL SPECIMEN)

## 2017-07-07 DIAGNOSIS — T8131XA Disruption of external operation (surgical) wound, not elsewhere classified, initial encounter: Secondary | ICD-10-CM | POA: Diagnosis not present

## 2017-07-21 DIAGNOSIS — T8131XA Disruption of external operation (surgical) wound, not elsewhere classified, initial encounter: Secondary | ICD-10-CM | POA: Diagnosis not present

## 2017-08-04 ENCOUNTER — Encounter (HOSPITAL_BASED_OUTPATIENT_CLINIC_OR_DEPARTMENT_OTHER): Payer: Medicare Other | Attending: Internal Medicine

## 2017-08-04 DIAGNOSIS — Y838 Other surgical procedures as the cause of abnormal reaction of the patient, or of later complication, without mention of misadventure at the time of the procedure: Secondary | ICD-10-CM | POA: Diagnosis not present

## 2017-08-04 DIAGNOSIS — G4730 Sleep apnea, unspecified: Secondary | ICD-10-CM | POA: Diagnosis not present

## 2017-08-04 DIAGNOSIS — I120 Hypertensive chronic kidney disease with stage 5 chronic kidney disease or end stage renal disease: Secondary | ICD-10-CM | POA: Insufficient documentation

## 2017-08-04 DIAGNOSIS — E1122 Type 2 diabetes mellitus with diabetic chronic kidney disease: Secondary | ICD-10-CM | POA: Diagnosis not present

## 2017-08-04 DIAGNOSIS — M069 Rheumatoid arthritis, unspecified: Secondary | ICD-10-CM | POA: Diagnosis not present

## 2017-08-04 DIAGNOSIS — N186 End stage renal disease: Secondary | ICD-10-CM | POA: Diagnosis not present

## 2017-08-04 DIAGNOSIS — T8131XA Disruption of external operation (surgical) wound, not elsewhere classified, initial encounter: Secondary | ICD-10-CM | POA: Insufficient documentation

## 2017-08-04 DIAGNOSIS — E1140 Type 2 diabetes mellitus with diabetic neuropathy, unspecified: Secondary | ICD-10-CM | POA: Diagnosis not present

## 2017-08-18 DIAGNOSIS — T8131XA Disruption of external operation (surgical) wound, not elsewhere classified, initial encounter: Secondary | ICD-10-CM | POA: Diagnosis not present

## 2017-09-07 ENCOUNTER — Encounter (HOSPITAL_BASED_OUTPATIENT_CLINIC_OR_DEPARTMENT_OTHER): Payer: Medicare Other | Attending: Internal Medicine

## 2017-09-07 ENCOUNTER — Other Ambulatory Visit (HOSPITAL_COMMUNITY)
Admission: RE | Admit: 2017-09-07 | Discharge: 2017-09-07 | Disposition: A | Discharge status: Home / Self Care | Payer: Medicare Other | Source: Other Acute Inpatient Hospital | Attending: Internal Medicine | Admitting: Internal Medicine

## 2017-09-07 DIAGNOSIS — Y838 Other surgical procedures as the cause of abnormal reaction of the patient, or of later complication, without mention of misadventure at the time of the procedure: Secondary | ICD-10-CM | POA: Insufficient documentation

## 2017-09-07 DIAGNOSIS — N186 End stage renal disease: Secondary | ICD-10-CM | POA: Insufficient documentation

## 2017-09-07 DIAGNOSIS — M069 Rheumatoid arthritis, unspecified: Secondary | ICD-10-CM | POA: Insufficient documentation

## 2017-09-07 DIAGNOSIS — T8131XA Disruption of external operation (surgical) wound, not elsewhere classified, initial encounter: Secondary | ICD-10-CM | POA: Diagnosis not present

## 2017-09-07 DIAGNOSIS — S31104A Unspecified open wound of abdominal wall, left lower quadrant without penetration into peritoneal cavity, initial encounter: Secondary | ICD-10-CM | POA: Insufficient documentation

## 2017-09-07 DIAGNOSIS — I120 Hypertensive chronic kidney disease with stage 5 chronic kidney disease or end stage renal disease: Secondary | ICD-10-CM | POA: Insufficient documentation

## 2017-09-07 DIAGNOSIS — G4730 Sleep apnea, unspecified: Secondary | ICD-10-CM | POA: Diagnosis not present

## 2017-09-07 DIAGNOSIS — E1122 Type 2 diabetes mellitus with diabetic chronic kidney disease: Secondary | ICD-10-CM | POA: Diagnosis not present

## 2017-09-07 DIAGNOSIS — I10 Essential (primary) hypertension: Secondary | ICD-10-CM | POA: Diagnosis not present

## 2017-09-10 LAB — AEROBIC CULTURE W GRAM STAIN (SUPERFICIAL SPECIMEN)

## 2017-09-10 LAB — AEROBIC CULTURE  (SUPERFICIAL SPECIMEN): CULTURE: NO GROWTH

## 2017-09-15 DIAGNOSIS — T8131XA Disruption of external operation (surgical) wound, not elsewhere classified, initial encounter: Secondary | ICD-10-CM | POA: Diagnosis not present

## 2017-10-02 ENCOUNTER — Encounter (HOSPITAL_BASED_OUTPATIENT_CLINIC_OR_DEPARTMENT_OTHER): Payer: Medicare Other | Attending: Internal Medicine

## 2017-10-02 DIAGNOSIS — T8131XA Disruption of external operation (surgical) wound, not elsewhere classified, initial encounter: Secondary | ICD-10-CM | POA: Diagnosis present

## 2017-10-02 DIAGNOSIS — Y838 Other surgical procedures as the cause of abnormal reaction of the patient, or of later complication, without mention of misadventure at the time of the procedure: Secondary | ICD-10-CM | POA: Insufficient documentation

## 2017-10-02 DIAGNOSIS — E1140 Type 2 diabetes mellitus with diabetic neuropathy, unspecified: Secondary | ICD-10-CM | POA: Diagnosis not present

## 2017-10-02 DIAGNOSIS — Z7984 Long term (current) use of oral hypoglycemic drugs: Secondary | ICD-10-CM | POA: Insufficient documentation

## 2017-10-02 DIAGNOSIS — M069 Rheumatoid arthritis, unspecified: Secondary | ICD-10-CM | POA: Insufficient documentation

## 2017-10-02 DIAGNOSIS — N186 End stage renal disease: Secondary | ICD-10-CM | POA: Insufficient documentation

## 2017-10-02 DIAGNOSIS — E1122 Type 2 diabetes mellitus with diabetic chronic kidney disease: Secondary | ICD-10-CM | POA: Insufficient documentation

## 2017-10-02 DIAGNOSIS — I120 Hypertensive chronic kidney disease with stage 5 chronic kidney disease or end stage renal disease: Secondary | ICD-10-CM | POA: Diagnosis not present

## 2017-10-19 DIAGNOSIS — T8131XA Disruption of external operation (surgical) wound, not elsewhere classified, initial encounter: Secondary | ICD-10-CM | POA: Diagnosis not present

## 2017-10-19 LAB — GLUCOSE, CAPILLARY: Glucose-Capillary: 112 mg/dL — ABNORMAL HIGH (ref 70–99)

## 2017-11-02 ENCOUNTER — Encounter (HOSPITAL_BASED_OUTPATIENT_CLINIC_OR_DEPARTMENT_OTHER): Payer: Medicare Other | Attending: Internal Medicine

## 2017-11-02 DIAGNOSIS — G4730 Sleep apnea, unspecified: Secondary | ICD-10-CM | POA: Insufficient documentation

## 2017-11-02 DIAGNOSIS — T8131XA Disruption of external operation (surgical) wound, not elsewhere classified, initial encounter: Secondary | ICD-10-CM | POA: Insufficient documentation

## 2017-11-02 DIAGNOSIS — N186 End stage renal disease: Secondary | ICD-10-CM | POA: Insufficient documentation

## 2017-11-02 DIAGNOSIS — E1122 Type 2 diabetes mellitus with diabetic chronic kidney disease: Secondary | ICD-10-CM | POA: Insufficient documentation

## 2017-11-02 DIAGNOSIS — I120 Hypertensive chronic kidney disease with stage 5 chronic kidney disease or end stage renal disease: Secondary | ICD-10-CM | POA: Insufficient documentation

## 2017-11-02 DIAGNOSIS — E1140 Type 2 diabetes mellitus with diabetic neuropathy, unspecified: Secondary | ICD-10-CM | POA: Insufficient documentation

## 2017-11-02 DIAGNOSIS — Y838 Other surgical procedures as the cause of abnormal reaction of the patient, or of later complication, without mention of misadventure at the time of the procedure: Secondary | ICD-10-CM | POA: Diagnosis not present

## 2017-11-02 DIAGNOSIS — M069 Rheumatoid arthritis, unspecified: Secondary | ICD-10-CM | POA: Diagnosis not present

## 2017-11-16 DIAGNOSIS — T8131XA Disruption of external operation (surgical) wound, not elsewhere classified, initial encounter: Secondary | ICD-10-CM | POA: Diagnosis not present

## 2017-11-30 ENCOUNTER — Encounter (HOSPITAL_BASED_OUTPATIENT_CLINIC_OR_DEPARTMENT_OTHER): Payer: Medicare Other | Attending: Internal Medicine

## 2017-11-30 DIAGNOSIS — G4730 Sleep apnea, unspecified: Secondary | ICD-10-CM | POA: Insufficient documentation

## 2017-11-30 DIAGNOSIS — M069 Rheumatoid arthritis, unspecified: Secondary | ICD-10-CM | POA: Insufficient documentation

## 2017-11-30 DIAGNOSIS — Y838 Other surgical procedures as the cause of abnormal reaction of the patient, or of later complication, without mention of misadventure at the time of the procedure: Secondary | ICD-10-CM | POA: Insufficient documentation

## 2017-11-30 DIAGNOSIS — E1140 Type 2 diabetes mellitus with diabetic neuropathy, unspecified: Secondary | ICD-10-CM | POA: Diagnosis not present

## 2017-11-30 DIAGNOSIS — N186 End stage renal disease: Secondary | ICD-10-CM | POA: Insufficient documentation

## 2017-11-30 DIAGNOSIS — E1122 Type 2 diabetes mellitus with diabetic chronic kidney disease: Secondary | ICD-10-CM | POA: Diagnosis not present

## 2017-11-30 DIAGNOSIS — I10 Essential (primary) hypertension: Secondary | ICD-10-CM | POA: Insufficient documentation

## 2017-11-30 DIAGNOSIS — R1032 Left lower quadrant pain: Secondary | ICD-10-CM | POA: Diagnosis not present

## 2017-11-30 DIAGNOSIS — I120 Hypertensive chronic kidney disease with stage 5 chronic kidney disease or end stage renal disease: Secondary | ICD-10-CM | POA: Diagnosis not present

## 2017-11-30 DIAGNOSIS — T8131XA Disruption of external operation (surgical) wound, not elsewhere classified, initial encounter: Secondary | ICD-10-CM | POA: Diagnosis not present

## 2017-12-14 DIAGNOSIS — T8131XA Disruption of external operation (surgical) wound, not elsewhere classified, initial encounter: Secondary | ICD-10-CM | POA: Diagnosis not present

## 2017-12-28 ENCOUNTER — Encounter (HOSPITAL_BASED_OUTPATIENT_CLINIC_OR_DEPARTMENT_OTHER): Payer: Medicare Other | Attending: Internal Medicine

## 2017-12-28 DIAGNOSIS — N186 End stage renal disease: Secondary | ICD-10-CM | POA: Diagnosis not present

## 2017-12-28 DIAGNOSIS — T8131XA Disruption of external operation (surgical) wound, not elsewhere classified, initial encounter: Secondary | ICD-10-CM | POA: Insufficient documentation

## 2017-12-28 DIAGNOSIS — I120 Hypertensive chronic kidney disease with stage 5 chronic kidney disease or end stage renal disease: Secondary | ICD-10-CM | POA: Diagnosis not present

## 2017-12-28 DIAGNOSIS — E1140 Type 2 diabetes mellitus with diabetic neuropathy, unspecified: Secondary | ICD-10-CM | POA: Insufficient documentation

## 2017-12-28 DIAGNOSIS — Y838 Other surgical procedures as the cause of abnormal reaction of the patient, or of later complication, without mention of misadventure at the time of the procedure: Secondary | ICD-10-CM | POA: Diagnosis not present

## 2017-12-28 DIAGNOSIS — M069 Rheumatoid arthritis, unspecified: Secondary | ICD-10-CM | POA: Insufficient documentation

## 2017-12-28 DIAGNOSIS — E1122 Type 2 diabetes mellitus with diabetic chronic kidney disease: Secondary | ICD-10-CM | POA: Diagnosis not present

## 2017-12-28 DIAGNOSIS — G4730 Sleep apnea, unspecified: Secondary | ICD-10-CM | POA: Diagnosis not present

## 2018-01-11 DIAGNOSIS — T8131XA Disruption of external operation (surgical) wound, not elsewhere classified, initial encounter: Secondary | ICD-10-CM | POA: Diagnosis not present

## 2018-01-24 ENCOUNTER — Encounter (HOSPITAL_BASED_OUTPATIENT_CLINIC_OR_DEPARTMENT_OTHER): Payer: Medicare Other | Attending: Internal Medicine

## 2018-01-24 DIAGNOSIS — Z9989 Dependence on other enabling machines and devices: Secondary | ICD-10-CM | POA: Insufficient documentation

## 2018-01-24 DIAGNOSIS — Y838 Other surgical procedures as the cause of abnormal reaction of the patient, or of later complication, without mention of misadventure at the time of the procedure: Secondary | ICD-10-CM | POA: Diagnosis not present

## 2018-01-24 DIAGNOSIS — Z87891 Personal history of nicotine dependence: Secondary | ICD-10-CM | POA: Insufficient documentation

## 2018-01-24 DIAGNOSIS — E1140 Type 2 diabetes mellitus with diabetic neuropathy, unspecified: Secondary | ICD-10-CM | POA: Diagnosis not present

## 2018-01-24 DIAGNOSIS — T8131XA Disruption of external operation (surgical) wound, not elsewhere classified, initial encounter: Secondary | ICD-10-CM | POA: Insufficient documentation

## 2018-01-24 DIAGNOSIS — I120 Hypertensive chronic kidney disease with stage 5 chronic kidney disease or end stage renal disease: Secondary | ICD-10-CM | POA: Insufficient documentation

## 2018-01-24 DIAGNOSIS — N186 End stage renal disease: Secondary | ICD-10-CM | POA: Insufficient documentation

## 2018-01-24 DIAGNOSIS — Z7984 Long term (current) use of oral hypoglycemic drugs: Secondary | ICD-10-CM | POA: Insufficient documentation

## 2018-01-24 DIAGNOSIS — G4730 Sleep apnea, unspecified: Secondary | ICD-10-CM | POA: Diagnosis not present

## 2018-01-24 DIAGNOSIS — E1122 Type 2 diabetes mellitus with diabetic chronic kidney disease: Secondary | ICD-10-CM | POA: Insufficient documentation

## 2018-01-31 DIAGNOSIS — T8131XA Disruption of external operation (surgical) wound, not elsewhere classified, initial encounter: Secondary | ICD-10-CM | POA: Diagnosis not present

## 2018-02-07 DIAGNOSIS — T8131XA Disruption of external operation (surgical) wound, not elsewhere classified, initial encounter: Secondary | ICD-10-CM | POA: Diagnosis not present

## 2018-02-15 DIAGNOSIS — T8131XA Disruption of external operation (surgical) wound, not elsewhere classified, initial encounter: Secondary | ICD-10-CM | POA: Diagnosis not present

## 2018-03-01 ENCOUNTER — Encounter (HOSPITAL_BASED_OUTPATIENT_CLINIC_OR_DEPARTMENT_OTHER): Payer: Medicare Other | Attending: Internal Medicine

## 2018-03-01 ENCOUNTER — Encounter (HOSPITAL_BASED_OUTPATIENT_CLINIC_OR_DEPARTMENT_OTHER): Payer: Self-pay

## 2018-03-01 DIAGNOSIS — T8131XA Disruption of external operation (surgical) wound, not elsewhere classified, initial encounter: Secondary | ICD-10-CM | POA: Diagnosis not present

## 2018-03-01 DIAGNOSIS — I120 Hypertensive chronic kidney disease with stage 5 chronic kidney disease or end stage renal disease: Secondary | ICD-10-CM | POA: Diagnosis not present

## 2018-03-01 DIAGNOSIS — I890 Lymphedema, not elsewhere classified: Secondary | ICD-10-CM | POA: Insufficient documentation

## 2018-03-01 DIAGNOSIS — E1122 Type 2 diabetes mellitus with diabetic chronic kidney disease: Secondary | ICD-10-CM | POA: Insufficient documentation

## 2018-03-01 DIAGNOSIS — N186 End stage renal disease: Secondary | ICD-10-CM | POA: Insufficient documentation

## 2018-03-01 DIAGNOSIS — E1140 Type 2 diabetes mellitus with diabetic neuropathy, unspecified: Secondary | ICD-10-CM | POA: Insufficient documentation

## 2018-03-01 DIAGNOSIS — Y838 Other surgical procedures as the cause of abnormal reaction of the patient, or of later complication, without mention of misadventure at the time of the procedure: Secondary | ICD-10-CM | POA: Insufficient documentation

## 2018-03-01 DIAGNOSIS — G4730 Sleep apnea, unspecified: Secondary | ICD-10-CM | POA: Diagnosis not present

## 2018-03-15 DIAGNOSIS — T8131XA Disruption of external operation (surgical) wound, not elsewhere classified, initial encounter: Secondary | ICD-10-CM | POA: Diagnosis not present

## 2018-03-29 ENCOUNTER — Encounter (HOSPITAL_BASED_OUTPATIENT_CLINIC_OR_DEPARTMENT_OTHER): Payer: Medicare Other | Attending: Internal Medicine

## 2018-03-29 DIAGNOSIS — G4730 Sleep apnea, unspecified: Secondary | ICD-10-CM | POA: Insufficient documentation

## 2018-03-29 DIAGNOSIS — E1140 Type 2 diabetes mellitus with diabetic neuropathy, unspecified: Secondary | ICD-10-CM | POA: Insufficient documentation

## 2018-03-29 DIAGNOSIS — E1122 Type 2 diabetes mellitus with diabetic chronic kidney disease: Secondary | ICD-10-CM | POA: Insufficient documentation

## 2018-03-29 DIAGNOSIS — M069 Rheumatoid arthritis, unspecified: Secondary | ICD-10-CM | POA: Insufficient documentation

## 2018-03-29 DIAGNOSIS — X58XXXA Exposure to other specified factors, initial encounter: Secondary | ICD-10-CM | POA: Insufficient documentation

## 2018-03-29 DIAGNOSIS — S31104A Unspecified open wound of abdominal wall, left lower quadrant without penetration into peritoneal cavity, initial encounter: Secondary | ICD-10-CM | POA: Insufficient documentation

## 2018-03-29 DIAGNOSIS — I120 Hypertensive chronic kidney disease with stage 5 chronic kidney disease or end stage renal disease: Secondary | ICD-10-CM | POA: Insufficient documentation

## 2018-03-29 DIAGNOSIS — N186 End stage renal disease: Secondary | ICD-10-CM | POA: Insufficient documentation

## 2018-03-29 DIAGNOSIS — L299 Pruritus, unspecified: Secondary | ICD-10-CM | POA: Insufficient documentation

## 2018-04-05 DIAGNOSIS — X58XXXA Exposure to other specified factors, initial encounter: Secondary | ICD-10-CM | POA: Diagnosis not present

## 2018-04-05 DIAGNOSIS — L299 Pruritus, unspecified: Secondary | ICD-10-CM | POA: Diagnosis not present

## 2018-04-05 DIAGNOSIS — N186 End stage renal disease: Secondary | ICD-10-CM | POA: Diagnosis not present

## 2018-04-05 DIAGNOSIS — S31104A Unspecified open wound of abdominal wall, left lower quadrant without penetration into peritoneal cavity, initial encounter: Secondary | ICD-10-CM | POA: Diagnosis not present

## 2018-04-05 DIAGNOSIS — G4730 Sleep apnea, unspecified: Secondary | ICD-10-CM | POA: Diagnosis not present

## 2018-04-05 DIAGNOSIS — E1122 Type 2 diabetes mellitus with diabetic chronic kidney disease: Secondary | ICD-10-CM | POA: Diagnosis not present

## 2018-04-05 DIAGNOSIS — M069 Rheumatoid arthritis, unspecified: Secondary | ICD-10-CM | POA: Diagnosis not present

## 2018-04-05 DIAGNOSIS — I120 Hypertensive chronic kidney disease with stage 5 chronic kidney disease or end stage renal disease: Secondary | ICD-10-CM | POA: Diagnosis not present

## 2018-04-05 DIAGNOSIS — E1140 Type 2 diabetes mellitus with diabetic neuropathy, unspecified: Secondary | ICD-10-CM | POA: Diagnosis not present

## 2018-04-23 ENCOUNTER — Encounter (HOSPITAL_BASED_OUTPATIENT_CLINIC_OR_DEPARTMENT_OTHER): Payer: Medicare Other | Attending: Internal Medicine

## 2018-04-23 DIAGNOSIS — E1122 Type 2 diabetes mellitus with diabetic chronic kidney disease: Secondary | ICD-10-CM | POA: Insufficient documentation

## 2018-04-23 DIAGNOSIS — E1140 Type 2 diabetes mellitus with diabetic neuropathy, unspecified: Secondary | ICD-10-CM | POA: Insufficient documentation

## 2018-04-23 DIAGNOSIS — G4739 Other sleep apnea: Secondary | ICD-10-CM | POA: Insufficient documentation

## 2018-04-23 DIAGNOSIS — I120 Hypertensive chronic kidney disease with stage 5 chronic kidney disease or end stage renal disease: Secondary | ICD-10-CM | POA: Insufficient documentation

## 2018-04-23 DIAGNOSIS — T8131XA Disruption of external operation (surgical) wound, not elsewhere classified, initial encounter: Secondary | ICD-10-CM | POA: Insufficient documentation

## 2018-04-23 DIAGNOSIS — Y838 Other surgical procedures as the cause of abnormal reaction of the patient, or of later complication, without mention of misadventure at the time of the procedure: Secondary | ICD-10-CM | POA: Insufficient documentation

## 2018-04-23 DIAGNOSIS — M069 Rheumatoid arthritis, unspecified: Secondary | ICD-10-CM | POA: Insufficient documentation

## 2018-04-23 DIAGNOSIS — N186 End stage renal disease: Secondary | ICD-10-CM | POA: Insufficient documentation

## 2018-05-01 DIAGNOSIS — G4739 Other sleep apnea: Secondary | ICD-10-CM | POA: Diagnosis not present

## 2018-05-01 DIAGNOSIS — E1140 Type 2 diabetes mellitus with diabetic neuropathy, unspecified: Secondary | ICD-10-CM | POA: Diagnosis not present

## 2018-05-01 DIAGNOSIS — Y838 Other surgical procedures as the cause of abnormal reaction of the patient, or of later complication, without mention of misadventure at the time of the procedure: Secondary | ICD-10-CM | POA: Diagnosis not present

## 2018-05-01 DIAGNOSIS — M069 Rheumatoid arthritis, unspecified: Secondary | ICD-10-CM | POA: Diagnosis not present

## 2018-05-01 DIAGNOSIS — E1122 Type 2 diabetes mellitus with diabetic chronic kidney disease: Secondary | ICD-10-CM | POA: Diagnosis not present

## 2018-05-01 DIAGNOSIS — T8131XA Disruption of external operation (surgical) wound, not elsewhere classified, initial encounter: Secondary | ICD-10-CM | POA: Diagnosis not present

## 2018-05-01 DIAGNOSIS — N186 End stage renal disease: Secondary | ICD-10-CM | POA: Diagnosis not present

## 2018-05-01 DIAGNOSIS — I120 Hypertensive chronic kidney disease with stage 5 chronic kidney disease or end stage renal disease: Secondary | ICD-10-CM | POA: Diagnosis not present

## 2018-05-17 DIAGNOSIS — T8131XA Disruption of external operation (surgical) wound, not elsewhere classified, initial encounter: Secondary | ICD-10-CM | POA: Diagnosis not present

## 2018-05-31 ENCOUNTER — Encounter (HOSPITAL_BASED_OUTPATIENT_CLINIC_OR_DEPARTMENT_OTHER): Payer: Medicare Other | Attending: Internal Medicine

## 2018-05-31 DIAGNOSIS — G4730 Sleep apnea, unspecified: Secondary | ICD-10-CM | POA: Insufficient documentation

## 2018-05-31 DIAGNOSIS — L98492 Non-pressure chronic ulcer of skin of other sites with fat layer exposed: Secondary | ICD-10-CM | POA: Insufficient documentation

## 2018-05-31 DIAGNOSIS — I120 Hypertensive chronic kidney disease with stage 5 chronic kidney disease or end stage renal disease: Secondary | ICD-10-CM | POA: Insufficient documentation

## 2018-05-31 DIAGNOSIS — E11622 Type 2 diabetes mellitus with other skin ulcer: Secondary | ICD-10-CM | POA: Insufficient documentation

## 2018-05-31 DIAGNOSIS — E1122 Type 2 diabetes mellitus with diabetic chronic kidney disease: Secondary | ICD-10-CM | POA: Insufficient documentation

## 2018-05-31 DIAGNOSIS — N186 End stage renal disease: Secondary | ICD-10-CM | POA: Insufficient documentation

## 2018-05-31 DIAGNOSIS — E1140 Type 2 diabetes mellitus with diabetic neuropathy, unspecified: Secondary | ICD-10-CM | POA: Insufficient documentation

## 2018-05-31 DIAGNOSIS — L89150 Pressure ulcer of sacral region, unstageable: Secondary | ICD-10-CM | POA: Insufficient documentation

## 2018-05-31 DIAGNOSIS — L89616 Pressure-induced deep tissue damage of right heel: Secondary | ICD-10-CM | POA: Insufficient documentation

## 2018-05-31 DIAGNOSIS — T8131XA Disruption of external operation (surgical) wound, not elsewhere classified, initial encounter: Secondary | ICD-10-CM | POA: Insufficient documentation

## 2018-05-31 DIAGNOSIS — Y838 Other surgical procedures as the cause of abnormal reaction of the patient, or of later complication, without mention of misadventure at the time of the procedure: Secondary | ICD-10-CM | POA: Insufficient documentation

## 2018-06-07 DIAGNOSIS — G4730 Sleep apnea, unspecified: Secondary | ICD-10-CM | POA: Diagnosis not present

## 2018-06-07 DIAGNOSIS — L89616 Pressure-induced deep tissue damage of right heel: Secondary | ICD-10-CM | POA: Diagnosis not present

## 2018-06-07 DIAGNOSIS — Y838 Other surgical procedures as the cause of abnormal reaction of the patient, or of later complication, without mention of misadventure at the time of the procedure: Secondary | ICD-10-CM | POA: Diagnosis not present

## 2018-06-07 DIAGNOSIS — T8131XA Disruption of external operation (surgical) wound, not elsewhere classified, initial encounter: Secondary | ICD-10-CM | POA: Diagnosis not present

## 2018-06-07 DIAGNOSIS — E1122 Type 2 diabetes mellitus with diabetic chronic kidney disease: Secondary | ICD-10-CM | POA: Diagnosis not present

## 2018-06-07 DIAGNOSIS — L89150 Pressure ulcer of sacral region, unstageable: Secondary | ICD-10-CM | POA: Diagnosis not present

## 2018-06-07 DIAGNOSIS — L98492 Non-pressure chronic ulcer of skin of other sites with fat layer exposed: Secondary | ICD-10-CM | POA: Diagnosis not present

## 2018-06-07 DIAGNOSIS — I120 Hypertensive chronic kidney disease with stage 5 chronic kidney disease or end stage renal disease: Secondary | ICD-10-CM | POA: Diagnosis not present

## 2018-06-07 DIAGNOSIS — N186 End stage renal disease: Secondary | ICD-10-CM | POA: Diagnosis not present

## 2018-06-07 DIAGNOSIS — E1140 Type 2 diabetes mellitus with diabetic neuropathy, unspecified: Secondary | ICD-10-CM | POA: Diagnosis not present

## 2018-06-07 DIAGNOSIS — E11622 Type 2 diabetes mellitus with other skin ulcer: Secondary | ICD-10-CM | POA: Diagnosis not present

## 2018-06-14 DIAGNOSIS — T8131XA Disruption of external operation (surgical) wound, not elsewhere classified, initial encounter: Secondary | ICD-10-CM | POA: Diagnosis not present

## 2018-06-28 ENCOUNTER — Encounter (HOSPITAL_BASED_OUTPATIENT_CLINIC_OR_DEPARTMENT_OTHER): Payer: Medicare Other | Attending: Internal Medicine

## 2018-06-28 DIAGNOSIS — Y838 Other surgical procedures as the cause of abnormal reaction of the patient, or of later complication, without mention of misadventure at the time of the procedure: Secondary | ICD-10-CM | POA: Insufficient documentation

## 2018-06-28 DIAGNOSIS — T8131XA Disruption of external operation (surgical) wound, not elsewhere classified, initial encounter: Secondary | ICD-10-CM | POA: Diagnosis not present

## 2018-06-28 DIAGNOSIS — X58XXXA Exposure to other specified factors, initial encounter: Secondary | ICD-10-CM | POA: Insufficient documentation

## 2018-06-28 DIAGNOSIS — S31109A Unspecified open wound of abdominal wall, unspecified quadrant without penetration into peritoneal cavity, initial encounter: Secondary | ICD-10-CM | POA: Insufficient documentation

## 2018-06-28 DIAGNOSIS — N186 End stage renal disease: Secondary | ICD-10-CM | POA: Diagnosis not present

## 2018-06-28 DIAGNOSIS — G4730 Sleep apnea, unspecified: Secondary | ICD-10-CM | POA: Diagnosis not present

## 2018-06-28 DIAGNOSIS — L89150 Pressure ulcer of sacral region, unstageable: Secondary | ICD-10-CM | POA: Insufficient documentation

## 2018-06-28 DIAGNOSIS — I120 Hypertensive chronic kidney disease with stage 5 chronic kidney disease or end stage renal disease: Secondary | ICD-10-CM | POA: Insufficient documentation

## 2018-06-28 DIAGNOSIS — E1122 Type 2 diabetes mellitus with diabetic chronic kidney disease: Secondary | ICD-10-CM | POA: Diagnosis not present

## 2018-06-28 DIAGNOSIS — E1140 Type 2 diabetes mellitus with diabetic neuropathy, unspecified: Secondary | ICD-10-CM | POA: Insufficient documentation

## 2018-07-12 DIAGNOSIS — L89150 Pressure ulcer of sacral region, unstageable: Secondary | ICD-10-CM | POA: Diagnosis not present

## 2018-07-26 ENCOUNTER — Encounter (HOSPITAL_BASED_OUTPATIENT_CLINIC_OR_DEPARTMENT_OTHER): Payer: Medicare Other | Attending: Internal Medicine

## 2018-07-26 DIAGNOSIS — X58XXXA Exposure to other specified factors, initial encounter: Secondary | ICD-10-CM | POA: Insufficient documentation

## 2018-07-26 DIAGNOSIS — M069 Rheumatoid arthritis, unspecified: Secondary | ICD-10-CM | POA: Insufficient documentation

## 2018-07-26 DIAGNOSIS — G4730 Sleep apnea, unspecified: Secondary | ICD-10-CM | POA: Insufficient documentation

## 2018-07-26 DIAGNOSIS — I120 Hypertensive chronic kidney disease with stage 5 chronic kidney disease or end stage renal disease: Secondary | ICD-10-CM | POA: Insufficient documentation

## 2018-07-26 DIAGNOSIS — Y838 Other surgical procedures as the cause of abnormal reaction of the patient, or of later complication, without mention of misadventure at the time of the procedure: Secondary | ICD-10-CM | POA: Insufficient documentation

## 2018-07-26 DIAGNOSIS — L89150 Pressure ulcer of sacral region, unstageable: Secondary | ICD-10-CM | POA: Insufficient documentation

## 2018-07-26 DIAGNOSIS — I890 Lymphedema, not elsewhere classified: Secondary | ICD-10-CM | POA: Insufficient documentation

## 2018-07-26 DIAGNOSIS — E1140 Type 2 diabetes mellitus with diabetic neuropathy, unspecified: Secondary | ICD-10-CM | POA: Insufficient documentation

## 2018-07-26 DIAGNOSIS — E1122 Type 2 diabetes mellitus with diabetic chronic kidney disease: Secondary | ICD-10-CM | POA: Insufficient documentation

## 2018-07-26 DIAGNOSIS — T8131XA Disruption of external operation (surgical) wound, not elsewhere classified, initial encounter: Secondary | ICD-10-CM | POA: Insufficient documentation

## 2018-07-26 DIAGNOSIS — N186 End stage renal disease: Secondary | ICD-10-CM | POA: Insufficient documentation

## 2018-07-26 DIAGNOSIS — S31109A Unspecified open wound of abdominal wall, unspecified quadrant without penetration into peritoneal cavity, initial encounter: Secondary | ICD-10-CM | POA: Insufficient documentation

## 2018-07-31 ENCOUNTER — Other Ambulatory Visit (HOSPITAL_COMMUNITY)
Admission: RE | Admit: 2018-07-31 | Discharge: 2018-07-31 | Disposition: A | Discharge status: Home / Self Care | Payer: Medicare (Managed Care) | Source: Other Acute Inpatient Hospital | Attending: Internal Medicine | Admitting: Internal Medicine

## 2018-07-31 DIAGNOSIS — L98498 Non-pressure chronic ulcer of skin of other sites with other specified severity: Secondary | ICD-10-CM | POA: Insufficient documentation

## 2018-07-31 DIAGNOSIS — E1122 Type 2 diabetes mellitus with diabetic chronic kidney disease: Secondary | ICD-10-CM | POA: Diagnosis not present

## 2018-07-31 DIAGNOSIS — L89150 Pressure ulcer of sacral region, unstageable: Secondary | ICD-10-CM | POA: Diagnosis not present

## 2018-07-31 DIAGNOSIS — G4730 Sleep apnea, unspecified: Secondary | ICD-10-CM | POA: Diagnosis not present

## 2018-07-31 DIAGNOSIS — N186 End stage renal disease: Secondary | ICD-10-CM | POA: Diagnosis not present

## 2018-07-31 DIAGNOSIS — Y838 Other surgical procedures as the cause of abnormal reaction of the patient, or of later complication, without mention of misadventure at the time of the procedure: Secondary | ICD-10-CM | POA: Diagnosis not present

## 2018-07-31 DIAGNOSIS — S31109A Unspecified open wound of abdominal wall, unspecified quadrant without penetration into peritoneal cavity, initial encounter: Secondary | ICD-10-CM | POA: Diagnosis not present

## 2018-07-31 DIAGNOSIS — I890 Lymphedema, not elsewhere classified: Secondary | ICD-10-CM | POA: Diagnosis not present

## 2018-07-31 DIAGNOSIS — T8131XA Disruption of external operation (surgical) wound, not elsewhere classified, initial encounter: Secondary | ICD-10-CM | POA: Diagnosis not present

## 2018-07-31 DIAGNOSIS — E1140 Type 2 diabetes mellitus with diabetic neuropathy, unspecified: Secondary | ICD-10-CM | POA: Diagnosis not present

## 2018-07-31 DIAGNOSIS — X58XXXA Exposure to other specified factors, initial encounter: Secondary | ICD-10-CM | POA: Diagnosis not present

## 2018-07-31 DIAGNOSIS — I120 Hypertensive chronic kidney disease with stage 5 chronic kidney disease or end stage renal disease: Secondary | ICD-10-CM | POA: Diagnosis not present

## 2018-07-31 DIAGNOSIS — M069 Rheumatoid arthritis, unspecified: Secondary | ICD-10-CM | POA: Diagnosis not present

## 2018-08-03 LAB — AEROBIC CULTURE W GRAM STAIN (SUPERFICIAL SPECIMEN): Culture: NORMAL

## 2018-08-03 LAB — AEROBIC CULTURE? (SUPERFICIAL SPECIMEN)

## 2018-08-16 DIAGNOSIS — T8131XA Disruption of external operation (surgical) wound, not elsewhere classified, initial encounter: Secondary | ICD-10-CM | POA: Diagnosis not present

## 2018-08-30 ENCOUNTER — Encounter (HOSPITAL_BASED_OUTPATIENT_CLINIC_OR_DEPARTMENT_OTHER): Payer: Medicare Other | Attending: Internal Medicine

## 2018-08-30 DIAGNOSIS — B354 Tinea corporis: Secondary | ICD-10-CM | POA: Insufficient documentation

## 2018-08-30 DIAGNOSIS — X58XXXA Exposure to other specified factors, initial encounter: Secondary | ICD-10-CM | POA: Insufficient documentation

## 2018-08-30 DIAGNOSIS — S31109A Unspecified open wound of abdominal wall, unspecified quadrant without penetration into peritoneal cavity, initial encounter: Secondary | ICD-10-CM | POA: Diagnosis not present

## 2018-08-30 DIAGNOSIS — E1140 Type 2 diabetes mellitus with diabetic neuropathy, unspecified: Secondary | ICD-10-CM | POA: Insufficient documentation

## 2018-08-30 DIAGNOSIS — I120 Hypertensive chronic kidney disease with stage 5 chronic kidney disease or end stage renal disease: Secondary | ICD-10-CM | POA: Diagnosis not present

## 2018-08-30 DIAGNOSIS — E1122 Type 2 diabetes mellitus with diabetic chronic kidney disease: Secondary | ICD-10-CM | POA: Diagnosis not present

## 2018-08-30 DIAGNOSIS — N186 End stage renal disease: Secondary | ICD-10-CM | POA: Insufficient documentation

## 2018-08-30 DIAGNOSIS — Y838 Other surgical procedures as the cause of abnormal reaction of the patient, or of later complication, without mention of misadventure at the time of the procedure: Secondary | ICD-10-CM | POA: Insufficient documentation

## 2018-08-30 DIAGNOSIS — L89150 Pressure ulcer of sacral region, unstageable: Secondary | ICD-10-CM | POA: Diagnosis not present

## 2018-08-30 DIAGNOSIS — G4730 Sleep apnea, unspecified: Secondary | ICD-10-CM | POA: Insufficient documentation

## 2018-08-30 DIAGNOSIS — Z6841 Body Mass Index (BMI) 40.0 and over, adult: Secondary | ICD-10-CM | POA: Diagnosis not present

## 2018-08-30 DIAGNOSIS — I890 Lymphedema, not elsewhere classified: Secondary | ICD-10-CM | POA: Insufficient documentation

## 2018-08-30 DIAGNOSIS — T8131XA Disruption of external operation (surgical) wound, not elsewhere classified, initial encounter: Secondary | ICD-10-CM | POA: Insufficient documentation

## 2018-09-13 DIAGNOSIS — I890 Lymphedema, not elsewhere classified: Secondary | ICD-10-CM | POA: Diagnosis not present

## 2018-09-27 ENCOUNTER — Encounter (HOSPITAL_BASED_OUTPATIENT_CLINIC_OR_DEPARTMENT_OTHER): Payer: Medicare Other | Attending: Internal Medicine

## 2018-09-27 DIAGNOSIS — L281 Prurigo nodularis: Secondary | ICD-10-CM | POA: Insufficient documentation

## 2018-09-27 DIAGNOSIS — Y838 Other surgical procedures as the cause of abnormal reaction of the patient, or of later complication, without mention of misadventure at the time of the procedure: Secondary | ICD-10-CM | POA: Insufficient documentation

## 2018-09-27 DIAGNOSIS — T8189XA Other complications of procedures, not elsewhere classified, initial encounter: Secondary | ICD-10-CM | POA: Diagnosis present

## 2018-09-27 DIAGNOSIS — L98492 Non-pressure chronic ulcer of skin of other sites with fat layer exposed: Secondary | ICD-10-CM | POA: Insufficient documentation

## 2018-09-27 DIAGNOSIS — I890 Lymphedema, not elsewhere classified: Secondary | ICD-10-CM | POA: Diagnosis not present

## 2018-09-27 DIAGNOSIS — E1122 Type 2 diabetes mellitus with diabetic chronic kidney disease: Secondary | ICD-10-CM | POA: Insufficient documentation

## 2018-09-27 DIAGNOSIS — Z87891 Personal history of nicotine dependence: Secondary | ICD-10-CM | POA: Diagnosis not present

## 2018-09-27 DIAGNOSIS — B354 Tinea corporis: Secondary | ICD-10-CM | POA: Diagnosis not present

## 2018-09-27 DIAGNOSIS — I129 Hypertensive chronic kidney disease with stage 1 through stage 4 chronic kidney disease, or unspecified chronic kidney disease: Secondary | ICD-10-CM | POA: Insufficient documentation

## 2018-09-27 DIAGNOSIS — N184 Chronic kidney disease, stage 4 (severe): Secondary | ICD-10-CM | POA: Insufficient documentation

## 2018-09-27 DIAGNOSIS — G4730 Sleep apnea, unspecified: Secondary | ICD-10-CM | POA: Diagnosis not present

## 2018-09-27 DIAGNOSIS — M069 Rheumatoid arthritis, unspecified: Secondary | ICD-10-CM | POA: Insufficient documentation

## 2018-09-27 DIAGNOSIS — E1140 Type 2 diabetes mellitus with diabetic neuropathy, unspecified: Secondary | ICD-10-CM | POA: Diagnosis not present

## 2018-09-28 ENCOUNTER — Other Ambulatory Visit: Payer: Self-pay

## 2018-09-28 LAB — GLUCOSE, CAPILLARY: Glucose-Capillary: 139 mg/dL — ABNORMAL HIGH (ref 70–99)

## 2018-10-04 ENCOUNTER — Emergency Department (HOSPITAL_COMMUNITY): Payer: Medicare Other

## 2018-10-04 ENCOUNTER — Encounter (HOSPITAL_COMMUNITY): Payer: Self-pay | Admitting: Emergency Medicine

## 2018-10-04 ENCOUNTER — Inpatient Hospital Stay (HOSPITAL_COMMUNITY)
Admission: EM | Admit: 2018-10-04 | Discharge: 2018-10-09 | DRG: 641 | Disposition: A | Discharge status: Home Health | Payer: Medicare Other | Attending: Internal Medicine | Admitting: Internal Medicine

## 2018-10-04 ENCOUNTER — Other Ambulatory Visit: Payer: Self-pay

## 2018-10-04 DIAGNOSIS — Z833 Family history of diabetes mellitus: Secondary | ICD-10-CM

## 2018-10-04 DIAGNOSIS — L8990 Pressure ulcer of unspecified site, unspecified stage: Secondary | ICD-10-CM | POA: Insufficient documentation

## 2018-10-04 DIAGNOSIS — E119 Type 2 diabetes mellitus without complications: Secondary | ICD-10-CM | POA: Diagnosis present

## 2018-10-04 DIAGNOSIS — E1140 Type 2 diabetes mellitus with diabetic neuropathy, unspecified: Secondary | ICD-10-CM | POA: Diagnosis present

## 2018-10-04 DIAGNOSIS — E875 Hyperkalemia: Principal | ICD-10-CM | POA: Diagnosis present

## 2018-10-04 DIAGNOSIS — M791 Myalgia, unspecified site: Secondary | ICD-10-CM | POA: Diagnosis present

## 2018-10-04 DIAGNOSIS — F329 Major depressive disorder, single episode, unspecified: Secondary | ICD-10-CM | POA: Diagnosis present

## 2018-10-04 DIAGNOSIS — L0390 Cellulitis, unspecified: Secondary | ICD-10-CM | POA: Diagnosis present

## 2018-10-04 DIAGNOSIS — I10 Essential (primary) hypertension: Secondary | ICD-10-CM | POA: Diagnosis present

## 2018-10-04 DIAGNOSIS — Z6841 Body Mass Index (BMI) 40.0 and over, adult: Secondary | ICD-10-CM

## 2018-10-04 DIAGNOSIS — T464X5A Adverse effect of angiotensin-converting-enzyme inhibitors, initial encounter: Secondary | ICD-10-CM | POA: Diagnosis present

## 2018-10-04 DIAGNOSIS — Z87891 Personal history of nicotine dependence: Secondary | ICD-10-CM

## 2018-10-04 DIAGNOSIS — E785 Hyperlipidemia, unspecified: Secondary | ICD-10-CM | POA: Diagnosis present

## 2018-10-04 DIAGNOSIS — Z880 Allergy status to penicillin: Secondary | ICD-10-CM

## 2018-10-04 DIAGNOSIS — Z888 Allergy status to other drugs, medicaments and biological substances status: Secondary | ICD-10-CM

## 2018-10-04 DIAGNOSIS — N184 Chronic kidney disease, stage 4 (severe): Secondary | ICD-10-CM | POA: Diagnosis present

## 2018-10-04 DIAGNOSIS — L98499 Non-pressure chronic ulcer of skin of other sites with unspecified severity: Secondary | ICD-10-CM | POA: Diagnosis present

## 2018-10-04 DIAGNOSIS — M25569 Pain in unspecified knee: Secondary | ICD-10-CM | POA: Diagnosis present

## 2018-10-04 DIAGNOSIS — L03311 Cellulitis of abdominal wall: Secondary | ICD-10-CM | POA: Diagnosis present

## 2018-10-04 DIAGNOSIS — E1122 Type 2 diabetes mellitus with diabetic chronic kidney disease: Secondary | ICD-10-CM | POA: Diagnosis present

## 2018-10-04 DIAGNOSIS — Z79899 Other long term (current) drug therapy: Secondary | ICD-10-CM

## 2018-10-04 DIAGNOSIS — G8929 Other chronic pain: Secondary | ICD-10-CM | POA: Diagnosis present

## 2018-10-04 DIAGNOSIS — Z7401 Bed confinement status: Secondary | ICD-10-CM

## 2018-10-04 DIAGNOSIS — F411 Generalized anxiety disorder: Secondary | ICD-10-CM | POA: Diagnosis present

## 2018-10-04 DIAGNOSIS — E11622 Type 2 diabetes mellitus with other skin ulcer: Secondary | ICD-10-CM | POA: Diagnosis present

## 2018-10-04 DIAGNOSIS — Z885 Allergy status to narcotic agent status: Secondary | ICD-10-CM

## 2018-10-04 DIAGNOSIS — E860 Dehydration: Secondary | ICD-10-CM | POA: Diagnosis present

## 2018-10-04 DIAGNOSIS — D631 Anemia in chronic kidney disease: Secondary | ICD-10-CM | POA: Diagnosis present

## 2018-10-04 DIAGNOSIS — I129 Hypertensive chronic kidney disease with stage 1 through stage 4 chronic kidney disease, or unspecified chronic kidney disease: Secondary | ICD-10-CM | POA: Diagnosis present

## 2018-10-04 DIAGNOSIS — N3000 Acute cystitis without hematuria: Secondary | ICD-10-CM

## 2018-10-04 DIAGNOSIS — E1169 Type 2 diabetes mellitus with other specified complication: Secondary | ICD-10-CM | POA: Diagnosis present

## 2018-10-04 DIAGNOSIS — E1165 Type 2 diabetes mellitus with hyperglycemia: Secondary | ICD-10-CM | POA: Diagnosis present

## 2018-10-04 DIAGNOSIS — Z20828 Contact with and (suspected) exposure to other viral communicable diseases: Secondary | ICD-10-CM | POA: Diagnosis present

## 2018-10-04 LAB — I-STAT CHEM 8, ED
BUN: 31 mg/dL — ABNORMAL HIGH (ref 8–23)
Calcium, Ion: 1.26 mmol/L (ref 1.15–1.40)
Chloride: 111 mmol/L (ref 98–111)
Creatinine, Ser: 1.7 mg/dL — ABNORMAL HIGH (ref 0.44–1.00)
Glucose, Bld: 211 mg/dL — ABNORMAL HIGH (ref 70–99)
HCT: 34 % — ABNORMAL LOW (ref 36.0–46.0)
Hemoglobin: 11.6 g/dL — ABNORMAL LOW (ref 12.0–15.0)
Potassium: 5.7 mmol/L — ABNORMAL HIGH (ref 3.5–5.1)
Sodium: 142 mmol/L (ref 135–145)
TCO2: 24 mmol/L (ref 22–32)

## 2018-10-04 LAB — MAGNESIUM: Magnesium: 1.8 mg/dL (ref 1.7–2.4)

## 2018-10-04 LAB — COMPREHENSIVE METABOLIC PANEL
ALT: 16 U/L (ref 0–44)
AST: 13 U/L — ABNORMAL LOW (ref 15–41)
Albumin: 3.6 g/dL (ref 3.5–5.0)
Alkaline Phosphatase: 112 U/L (ref 38–126)
Anion gap: 11 (ref 5–15)
BUN: 30 mg/dL — ABNORMAL HIGH (ref 8–23)
CO2: 21 mmol/L — ABNORMAL LOW (ref 22–32)
Calcium: 9.2 mg/dL (ref 8.9–10.3)
Chloride: 109 mmol/L (ref 98–111)
Creatinine, Ser: 1.76 mg/dL — ABNORMAL HIGH (ref 0.44–1.00)
GFR calc Af Amer: 35 mL/min — ABNORMAL LOW (ref >60)
GFR calc non Af Amer: 30 mL/min — ABNORMAL LOW (ref >60)
Glucose, Bld: 222 mg/dL — ABNORMAL HIGH (ref 70–99)
Potassium: 5.7 mmol/L — ABNORMAL HIGH (ref 3.5–5.1)
Sodium: 141 mmol/L (ref 135–145)
Total Bilirubin: 0.3 mg/dL (ref 0.3–1.2)
Total Protein: 7.7 g/dL (ref 6.5–8.1)

## 2018-10-04 LAB — CBC WITH DIFFERENTIAL/PLATELET
Abs Immature Granulocytes: 0.05 10*3/uL (ref 0.00–0.07)
Basophils Absolute: 0 10*3/uL (ref 0.0–0.1)
Basophils Relative: 0 %
Eosinophils Absolute: 0.3 10*3/uL (ref 0.0–0.5)
Eosinophils Relative: 4 %
HCT: 34.1 % — ABNORMAL LOW (ref 36.0–46.0)
Hemoglobin: 10 g/dL — ABNORMAL LOW (ref 12.0–15.0)
Immature Granulocytes: 1 %
Lymphocytes Relative: 20 %
Lymphs Abs: 1.6 10*3/uL (ref 0.7–4.0)
MCH: 24.9 pg — ABNORMAL LOW (ref 26.0–34.0)
MCHC: 29.3 g/dL — ABNORMAL LOW (ref 30.0–36.0)
MCV: 85 fL (ref 80.0–100.0)
Monocytes Absolute: 0.8 10*3/uL (ref 0.1–1.0)
Monocytes Relative: 10 %
Neutro Abs: 5 10*3/uL (ref 1.7–7.7)
Neutrophils Relative %: 65 %
Platelets: 474 10*3/uL — ABNORMAL HIGH (ref 150–400)
RBC: 4.01 MIL/uL (ref 3.87–5.11)
RDW: 15.8 % — ABNORMAL HIGH (ref 11.5–15.5)
WBC: 7.8 10*3/uL (ref 4.0–10.5)
nRBC: 0 % (ref 0.0–0.2)

## 2018-10-04 LAB — BRAIN NATRIURETIC PEPTIDE: B Natriuretic Peptide: 38.7 pg/mL (ref 0.0–100.0)

## 2018-10-04 LAB — LIPASE, BLOOD: Lipase: 63 U/L — ABNORMAL HIGH (ref 11–51)

## 2018-10-04 LAB — CK: Total CK: 36 U/L — ABNORMAL LOW (ref 38–234)

## 2018-10-04 LAB — TROPONIN I (HIGH SENSITIVITY): Troponin I (High Sensitivity): 4 ng/L (ref <18)

## 2018-10-04 LAB — LACTIC ACID, PLASMA: Lactic Acid, Venous: 1.3 mmol/L (ref 0.5–1.9)

## 2018-10-04 MED ORDER — FUROSEMIDE 10 MG/ML IJ SOLN
40.0000 mg | Freq: Once | INTRAMUSCULAR | Status: DC
  Filled 2018-10-04: qty 4

## 2018-10-04 MED ORDER — SODIUM ZIRCONIUM CYCLOSILICATE 10 G PO PACK
10.0000 g | PACK | Freq: Once | ORAL | Status: AC
  Administered 2018-10-04: 10 g via ORAL
  Filled 2018-10-04: qty 1

## 2018-10-04 MED ORDER — OXYCODONE-ACETAMINOPHEN 5-325 MG PO TABS
1.0000 | ORAL_TABLET | Freq: Once | ORAL | Status: AC
  Administered 2018-10-04: 1 via ORAL
  Filled 2018-10-04: qty 1

## 2018-10-04 MED ORDER — FUROSEMIDE 40 MG PO TABS
40.0000 mg | ORAL_TABLET | Freq: Once | ORAL | Status: AC
  Administered 2018-10-04: 40 mg via ORAL
  Filled 2018-10-04: qty 1

## 2018-10-04 MED ORDER — OXYCODONE HCL 5 MG PO TABS
5.0000 mg | ORAL_TABLET | Freq: Once | ORAL | Status: AC
  Administered 2018-10-04: 18:00:00 5 mg via ORAL
  Filled 2018-10-04: qty 1
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
## 2018-10-04 NOTE — ED Notes (Signed)
Pt aware that urine is needed, pt placed on purewick.

## 2018-10-04 NOTE — ED Triage Notes (Addendum)
Per EMS, patient from home, had labs drawn at PCP yesterday. Called for potassium 6.7. Patient c/o pain all over. States this is baseline. Denies chest pain. Patient is non ambulatory.

## 2018-10-04 NOTE — ED Provider Notes (Signed)
63 year old female received at signout from Dr. Sherry Ruffing pending urinalysis.  In brief, the patient was sent to the ER after labs at her PCPs office demonstrated hyperkalemia of 6.7.she is also been having muscle aching, more pain in her chronic wounds, shortness of breath, and fatigue.  Per his HPI:  The history is provided by the patient and medical records. No language interpreter was used.  Illness Location:  Diffuse muscle pains Quality:  All over Severity:  Severe Onset quality:  Gradual Duration:  1 week Timing:  Constant Progression:  Waxing and waning Chronicity:  New Associated symptoms: fatigue and shortness of breath   Associated symptoms: no abdominal pain, no chest pain, no congestion, no cough, no diarrhea, no fever, no headaches, no loss of consciousness, no nausea, no vomiting and no wheezing"    Patient reports she is also been having burning dysuria for the last few days.  She reports that "for the last 68 weeks" she has been more weak and reports that weakness has been gradually worsening with time.  She reports that she is now unable to stand without assistance.  She reports that she requires assistance to stand and ambulate to the restroom.  She lives alone and has had to call family or neighbors every time she needs to void.  She does wear adult diapers, but less frequently.  She reports that sometimes she will only void 2 or 3 times per day due to not having anyone assist her to the restroom.  She does not have home health or home wound care.  She does see wound care in the office once every other week, next appointment is in 8 days.  Physical Exam  BP 139/60   Pulse 80   Temp 98.1 F (36.7 C) (Oral)   Resp 17   Ht 5\' 2"  (1.575 m)   Wt (!) 140.1 kg   SpO2 96%   BMI 56.49 kg/m   Physical Exam Vitals signs and nursing note reviewed.  Constitutional:      Appearance: She is well-developed.     Comments: Morbidly obese  HENT:     Head: Normocephalic and  atraumatic.  Neck:     Musculoskeletal: Normal range of motion.  Abdominal:     Tenderness: There is abdominal tenderness.       Comments: Multiple chronic wounds located around the pannus of the abdomen.  Patient is tender to palpation to wound on the left flank and right chest wall.  Erythema and purulent discharge is noted.   Musculoskeletal: Normal range of motion.     Right shoulder: She exhibits no tenderness.  Neurological:     Mental Status: She is alert.     ED Course/Procedures     Procedures  MDM   63 year old female received a signout from Dr. Sherry Ruffing pending urinalysis.  Please see his note for further work-up and medical decision making.  Patient sent for evaluation after she was found to be hyperkalemic at 6.7 by labs drawn at her PCPs office.  Potassium today is 5.7, which was treated with Lokelma and Lasix.  She reported to Dr. Sherry Ruffing that she self reduced her Lasix dosing from 40mg  to 20 mg daily and she did have crackles on exam.  However, repeat potassium is 5.2.  UA is concerning for infection.  Previous culture and sensitivity was pansensitive several years ago.  She also appears to have developing cellulitis around some of her chronic wounds.  She is not scheduled to see wound  care for another 8 days.  She also expresses concern about her current living situation as she lives alone and has become increasingly deconditioned over the last 1+ year to the point where she is unable to stand without assistance.  Currently, she is calling family members or neighbors to assist her with standing and ambulating.  In the setting of multiple sources of infection and concern for self-care at home, I think it is reasonable to admit the patient for further work-up and evaluation.  The patient was discussed with Dr. Leonides Schanz, attending physician who is in agreement with the work-up and plan.  Consult to the hospitalist team and spoke with Dr. Hal Hope who will accept the patient for  admission. The patient appears reasonably stabilized for admission considering the current resources, flow, and capabilities available in the ED at this time, and I doubt any other [facility] requiring further screening and/or treatment in the ED prior to admission.    Joline Maxcy A, PA-C 10/05/18 0752    Ward, Delice Bison, DO 10/05/18 2308

## 2018-10-05 ENCOUNTER — Encounter (HOSPITAL_COMMUNITY): Payer: Self-pay | Admitting: Internal Medicine

## 2018-10-05 ENCOUNTER — Other Ambulatory Visit: Payer: Self-pay

## 2018-10-05 DIAGNOSIS — L0390 Cellulitis, unspecified: Secondary | ICD-10-CM

## 2018-10-05 DIAGNOSIS — E875 Hyperkalemia: Secondary | ICD-10-CM | POA: Diagnosis not present

## 2018-10-05 DIAGNOSIS — N3000 Acute cystitis without hematuria: Secondary | ICD-10-CM

## 2018-10-05 DIAGNOSIS — E11649 Type 2 diabetes mellitus with hypoglycemia without coma: Secondary | ICD-10-CM | POA: Diagnosis not present

## 2018-10-05 DIAGNOSIS — I10 Essential (primary) hypertension: Secondary | ICD-10-CM | POA: Diagnosis present

## 2018-10-05 DIAGNOSIS — M791 Myalgia, unspecified site: Secondary | ICD-10-CM

## 2018-10-05 DIAGNOSIS — L8990 Pressure ulcer of unspecified site, unspecified stage: Secondary | ICD-10-CM | POA: Insufficient documentation

## 2018-10-05 LAB — URINALYSIS, ROUTINE W REFLEX MICROSCOPIC
Bilirubin Urine: NEGATIVE
Glucose, UA: NEGATIVE mg/dL
Hgb urine dipstick: NEGATIVE
Ketones, ur: NEGATIVE mg/dL
Nitrite: NEGATIVE
Protein, ur: NEGATIVE mg/dL
Specific Gravity, Urine: 1.016 (ref 1.005–1.030)
pH: 5 (ref 5.0–8.0)

## 2018-10-05 LAB — I-STAT CHEM 8, ED
BUN: 32 mg/dL — ABNORMAL HIGH (ref 8–23)
Calcium, Ion: 1.26 mmol/L (ref 1.15–1.40)
Chloride: 111 mmol/L (ref 98–111)
Creatinine, Ser: 1.8 mg/dL — ABNORMAL HIGH (ref 0.44–1.00)
Glucose, Bld: 159 mg/dL — ABNORMAL HIGH (ref 70–99)
HCT: 32 % — ABNORMAL LOW (ref 36.0–46.0)
Hemoglobin: 10.9 g/dL — ABNORMAL LOW (ref 12.0–15.0)
Potassium: 5.2 mmol/L — ABNORMAL HIGH (ref 3.5–5.1)
Sodium: 142 mmol/L (ref 135–145)
TCO2: 22 mmol/L (ref 22–32)

## 2018-10-05 LAB — GLUCOSE, CAPILLARY
Glucose-Capillary: 111 mg/dL — ABNORMAL HIGH (ref 70–99)
Glucose-Capillary: 131 mg/dL — ABNORMAL HIGH (ref 70–99)
Glucose-Capillary: 138 mg/dL — ABNORMAL HIGH (ref 70–99)

## 2018-10-05 LAB — BASIC METABOLIC PANEL
Anion gap: 10 (ref 5–15)
BUN: 30 mg/dL — ABNORMAL HIGH (ref 8–23)
CO2: 21 mmol/L — ABNORMAL LOW (ref 22–32)
Calcium: 9.2 mg/dL (ref 8.9–10.3)
Chloride: 107 mmol/L (ref 98–111)
Creatinine, Ser: 1.77 mg/dL — ABNORMAL HIGH (ref 0.44–1.00)
GFR calc Af Amer: 34 mL/min — ABNORMAL LOW (ref >60)
GFR calc non Af Amer: 30 mL/min — ABNORMAL LOW (ref >60)
Glucose, Bld: 177 mg/dL — ABNORMAL HIGH (ref 70–99)
Potassium: 5.9 mmol/L — ABNORMAL HIGH (ref 3.5–5.1)
Sodium: 138 mmol/L (ref 135–145)

## 2018-10-05 LAB — SARS CORONAVIRUS 2 BY RT PCR (HOSPITAL ORDER, PERFORMED IN ~~LOC~~ HOSPITAL LAB): SARS Coronavirus 2: NEGATIVE

## 2018-10-05 LAB — HEMOGLOBIN A1C
Hgb A1c MFr Bld: 6.9 % — ABNORMAL HIGH (ref 4.8–5.6)
Mean Plasma Glucose: 151.33 mg/dL

## 2018-10-05 LAB — TROPONIN I (HIGH SENSITIVITY): Troponin I (High Sensitivity): 4 ng/L (ref <18)

## 2018-10-05 MED ORDER — OXYCODONE-ACETAMINOPHEN 10-325 MG PO TABS: 1.0000 | ORAL_TABLET | Freq: Three times a day (TID) | ORAL | Status: DC | PRN

## 2018-10-05 MED ORDER — HYDROCODONE-ACETAMINOPHEN 5-325 MG PO TABS
2.0000 | ORAL_TABLET | Freq: Once | ORAL | Status: AC
  Administered 2018-10-05: 2 via ORAL
  Filled 2018-10-05: qty 2

## 2018-10-05 MED ORDER — ALPRAZOLAM 1 MG PO TABS
1.0000 mg | ORAL_TABLET | Freq: Three times a day (TID) | ORAL | Status: DC | PRN
  Administered 2018-10-05 – 2018-10-08 (×3): 1 mg via ORAL
  Filled 2018-10-05 (×3): qty 1

## 2018-10-05 MED ORDER — OXYCODONE HCL 5 MG PO TABS
5.0000 mg | ORAL_TABLET | Freq: Three times a day (TID) | ORAL | Status: DC | PRN
  Administered 2018-10-05 – 2018-10-08 (×5): 5 mg via ORAL
  Filled 2018-10-05 (×5): qty 1

## 2018-10-05 MED ORDER — SODIUM CHLORIDE 0.9 % IV SOLN
2.0000 g | Freq: Once | INTRAVENOUS | Status: AC
  Administered 2018-10-05: 2 g via INTRAVENOUS
  Filled 2018-10-05: qty 2

## 2018-10-05 MED ORDER — ONDANSETRON HCL 4 MG/2ML IJ SOLN: 4.0000 mg | Freq: Four times a day (QID) | INTRAMUSCULAR | Status: DC | PRN

## 2018-10-05 MED ORDER — OXYCODONE-ACETAMINOPHEN 5-325 MG PO TABS
1.0000 | ORAL_TABLET | Freq: Three times a day (TID) | ORAL | Status: DC | PRN
  Administered 2018-10-05 – 2018-10-08 (×6): 1 via ORAL
  Filled 2018-10-05 (×6): qty 1

## 2018-10-05 MED ORDER — ZOLPIDEM TARTRATE 5 MG PO TABS
5.0000 mg | ORAL_TABLET | Freq: Every evening | ORAL | Status: DC | PRN
  Administered 2018-10-06 – 2018-10-08 (×3): 5 mg via ORAL
  Filled 2018-10-05 (×3): qty 1

## 2018-10-05 MED ORDER — SODIUM CHLORIDE 0.9 % IV SOLN
2.0000 g | Freq: Two times a day (BID) | INTRAVENOUS | Status: DC
  Administered 2018-10-05 – 2018-10-09 (×9): 2 g via INTRAVENOUS
  Filled 2018-10-05 (×11): qty 2

## 2018-10-05 MED ORDER — ONDANSETRON HCL 4 MG PO TABS: 4.0000 mg | ORAL_TABLET | Freq: Four times a day (QID) | ORAL | Status: DC | PRN

## 2018-10-05 MED ORDER — GABAPENTIN 300 MG PO CAPS
600.0000 mg | ORAL_CAPSULE | Freq: Three times a day (TID) | ORAL | Status: DC
  Administered 2018-10-05 – 2018-10-09 (×13): 600 mg via ORAL
  Filled 2018-10-05 (×13): qty 2

## 2018-10-05 MED ORDER — VANCOMYCIN HCL 10 G IV SOLR
1500.0000 mg | Freq: Once | INTRAVENOUS | Status: AC
  Administered 2018-10-05: 1500 mg via INTRAVENOUS
  Filled 2018-10-05: qty 1500

## 2018-10-05 MED ORDER — ACETAMINOPHEN 650 MG RE SUPP: 650.0000 mg | Freq: Four times a day (QID) | RECTAL | Status: DC | PRN

## 2018-10-05 MED ORDER — HYDRALAZINE HCL 20 MG/ML IJ SOLN: 10.0000 mg | INTRAMUSCULAR | Status: DC | PRN

## 2018-10-05 MED ORDER — ACETAMINOPHEN 325 MG PO TABS: 650.0000 mg | ORAL_TABLET | Freq: Four times a day (QID) | ORAL | Status: DC | PRN

## 2018-10-05 MED ORDER — VANCOMYCIN HCL 10 G IV SOLR
1750.0000 mg | INTRAVENOUS | Status: DC
  Administered 2018-10-07 – 2018-10-09 (×2): 1750 mg via INTRAVENOUS
  Filled 2018-10-05 (×2): qty 1750

## 2018-10-05 MED ORDER — ENOXAPARIN SODIUM 60 MG/0.6ML ~~LOC~~ SOLN
60.0000 mg | SUBCUTANEOUS | Status: DC
  Administered 2018-10-05 – 2018-10-06 (×2): 60 mg via SUBCUTANEOUS
  Filled 2018-10-05 (×2): qty 0.6

## 2018-10-05 MED ORDER — HYDROXYZINE HCL 25 MG PO TABS
25.0000 mg | ORAL_TABLET | Freq: Three times a day (TID) | ORAL | Status: DC | PRN
  Administered 2018-10-05 – 2018-10-06 (×3): 25 mg via ORAL
  Filled 2018-10-05 (×3): qty 1

## 2018-10-05 MED ORDER — PHENAZOPYRIDINE HCL 100 MG PO TABS: 100.0000 mg | ORAL_TABLET | Freq: Two times a day (BID) | ORAL | Status: DC | PRN

## 2018-10-05 MED ORDER — INSULIN ASPART 100 UNIT/ML ~~LOC~~ SOLN
0.0000 [IU] | Freq: Three times a day (TID) | SUBCUTANEOUS | Status: DC
  Administered 2018-10-05 (×2): 1 [IU] via SUBCUTANEOUS
  Administered 2018-10-06: 2 [IU] via SUBCUTANEOUS
  Administered 2018-10-07: 1 [IU] via SUBCUTANEOUS
  Administered 2018-10-07 – 2018-10-08 (×2): 2 [IU] via SUBCUTANEOUS
  Administered 2018-10-08: 1 [IU] via SUBCUTANEOUS
  Administered 2018-10-09: 2 [IU] via SUBCUTANEOUS

## 2018-10-05 MED ORDER — VANCOMYCIN HCL IN DEXTROSE 1-5 GM/200ML-% IV SOLN
1000.0000 mg | Freq: Once | INTRAVENOUS | Status: AC
  Administered 2018-10-05: 1000 mg via INTRAVENOUS
  Filled 2018-10-05: qty 200

## 2018-10-05 NOTE — Progress Notes (Signed)
Please see Dr Moise Boring note from this am with detailed H&P.    63 year old lady admitted for hyperkalemia and worsening drainage from multiple wounds on her torso, abdomen.    Patient seen and examined.  Alert and comfortable , not in distress.  CVS s1s2 heard, RRR,  Lungs clear to auscultation. No wheezing or rhonchi.   Abdomen:  Obese, organomegaly cannot be appreciated. Mild tenderness in the area of the wounds on the back  Bowel sounds are good.   Extremities:  Leg edema present.    Plan:  Broad spectrum IV antibiotics for the chronic wounds and wound care consult and possibly surgical consult to see if they need debridement.  Management of hyperkalemia.    Hosie Poisson, MD [phone]

## 2018-10-05 NOTE — Consult Note (Signed)
[place] Nurse wound consult note Patient receiving care in [place] Reason for Consult: "multiple wounds with discharge" Wound type: unknown etiology.  The patient states she has brought reports that identify what the cause of these lesions are, but I cannot locate them in the record.  None of the areas that I describe below look infected.  She also states she has been going to a wound center. POA: Yes Measurement: the right lateral side has a deep fold that has multiple wounds in it.  Two distinct areas are present.  The largest one measures 2 cm x 5 cm is comprised of 2 distinct wounds with a narrow epithelial island separating them.  There is no measureable depth.  These areas are 100% pink and clean.  To the lateral side of these there is an area that measures 1 cm x 3 cm is also comprised of 2 distinct wounds separated by an epithelial island, and these are more of a yellow color, but not slough.  On the left lateral side there is a deep body fold that has several linear marks that are hypopigmented, but do not appear as open wounds.  They may have been at some point in the past.  And lastly, the lower abdominal fold has a large wound that measures 9.8 cm x 7 cm x 3 cm is 100% pink and clean.  The patient states this large wound started out like the other smaller ones. None are draining. Periwound: intact around all wounds described. Dressing procedure/placement/frequency: Place a Xeroform gauze [name] [id number]) over the superficial wounds, and into the deep abdominal wound.  Lay an ABD pad over this.  Do not tape if you can avoid it.  Change daily. Monitor the wound area(s) for worsening of condition such as: Signs/symptoms of infection,  Increase in size,  Development of or worsening of odor, Development of pain, or increased pain at the affected locations.  Notify the medical team if any of these develop.  Thank you for the consult.  Discussed plan of care with the patient.  [facility] nurse will not follow  at this time.  Please re-consult the [place] team if needed.  Val Riles, RN, MSN, CWOCN, CNS-BC, pager [phone]

## 2018-10-05 NOTE — Progress Notes (Signed)
Pharmacy Antibiotic Note  [name] [name] is a 63 y.o. female admitted on 10/04/2018 with multiple wounds with discharge concerning for infection.  Pharmacy has been consulted for vancomycin and cefepime dosing. Wt 140.1 kg. CKD 4. SCr 1.8 AF WBC WNL Pt has PCN allergy listed on chart. Night shift pharmacist d/w PA - patient confirmed taking cephalexin recently and tolerated.   Plan: vanc 1 gm in ED plus vanc 1500 mg now to make 2500 mg loading dose Vancomycin 1750 mg IV Q 48 hrs. Goal AUC 400-550. Expected AUC: 502.6 SCr used: 1.8 Expected AUC: 479.4 SCr used: 1.7 Vd 0.5 for BMI 56 Cefepime 2 gm IV q12  F/u renal function, WBC, temp, culture data Vancomycin levels as needed  Height: 5\' 2"  (157.5 cm) Weight: (!) 308 lb 13.8 oz (140.1 kg) IBW/kg (Calculated) : 50.1  Temp (24hrs), Avg:98.1 F (36.7 C), Min:98.1 F (36.7 C), Max:98.1 F (36.7 C)  Recent Labs  Lab 10/04/18 1651 10/04/18 1655 10/04/18 1809 10/05/18 0132  WBC  --   --  7.8  --   CREATININE 1.76* 1.70*  --  1.80*  LATICACIDVEN  --   --  1.3  --     Estimated Creatinine Clearance: 42.4 mL/min (A) (by C-G formula based on SCr of 1.8 mg/dL (H)).    Allergies  Allergen Reactions  . Morphine Rash  . Penicillins Itching, Nausea And Vomiting, Rash and Hives    Has patient had a PCN reaction causing immediate rash, facial/tongue/throat swelling, SOB or lightheadedness with hypotension: No Has patient had a PCN reaction causing severe rash involving mucus membranes or skin necrosis: No Has patient had a PCN reaction that required hospitalization: No Has patient had a PCN reaction occurring within the last 10 years: No If all of the above answers are "NO", then may proceed with Cephalosporin use.  Has patient had a PCN reaction causing immediate rash, facial/tongue/throat swelling, SOB or lightheadedness with hypotension: No Has patient had a PCN reaction causing severe rash involving mucus membranes or skin  necrosis: No Has patient had a PCN reaction that required hospitalization: No Has patient had a PCN reaction occurring within the last 10 years: No If all of the above answers are "NO", then may proceed with Cephalosporin use.   Marland Kitchen Morphine And Related Itching    Antimicrobials this admission: 8/14 vanc >> 8/14 cefepime>> Dose adjustments this admission:  Microbiology results: 8/14 BCx2: sent 8/14 UCx: sent 8/14 Covid: neg  Thank you for allowing pharmacy to be a part of this patient's care.  Eudelia Bunch, Pharm.D [phone] 10/05/2018 8:28 AM

## 2018-10-05 NOTE — Progress Notes (Signed)
A consult was received from an ED provider for cefepime and vancomycin per pharmacy dosing.  The patient's profile has been reviewed for ht/wt/allergies/indication/available labs.    No weight in system for vancomycin dosing.  Pt has PCN allergy listed on chart. Discussed with PA - patient confirmed taking cephalexin recently and tolerated.   A one time order has been placed for cefepime 2 g IV once and vancomycin 1000 mg IV once.  Further antibiotics/pharmacy consults should be ordered by admitting physician if indicated.                       Thank you, Lenis Noon, PharmD 10/05/2018  2:14 AM
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
## 2018-10-06 DIAGNOSIS — E875 Hyperkalemia: Secondary | ICD-10-CM | POA: Diagnosis present

## 2018-10-06 DIAGNOSIS — I10 Essential (primary) hypertension: Secondary | ICD-10-CM

## 2018-10-06 DIAGNOSIS — N184 Chronic kidney disease, stage 4 (severe): Secondary | ICD-10-CM | POA: Diagnosis present

## 2018-10-06 DIAGNOSIS — M791 Myalgia, unspecified site: Secondary | ICD-10-CM | POA: Diagnosis present

## 2018-10-06 DIAGNOSIS — L03311 Cellulitis of abdominal wall: Secondary | ICD-10-CM

## 2018-10-06 DIAGNOSIS — D631 Anemia in chronic kidney disease: Secondary | ICD-10-CM | POA: Diagnosis present

## 2018-10-06 DIAGNOSIS — G8929 Other chronic pain: Secondary | ICD-10-CM | POA: Diagnosis present

## 2018-10-06 DIAGNOSIS — I129 Hypertensive chronic kidney disease with stage 1 through stage 4 chronic kidney disease, or unspecified chronic kidney disease: Secondary | ICD-10-CM | POA: Diagnosis present

## 2018-10-06 DIAGNOSIS — E11622 Type 2 diabetes mellitus with other skin ulcer: Secondary | ICD-10-CM | POA: Diagnosis present

## 2018-10-06 DIAGNOSIS — E785 Hyperlipidemia, unspecified: Secondary | ICD-10-CM | POA: Diagnosis present

## 2018-10-06 DIAGNOSIS — N3000 Acute cystitis without hematuria: Secondary | ICD-10-CM | POA: Diagnosis present

## 2018-10-06 DIAGNOSIS — E1140 Type 2 diabetes mellitus with diabetic neuropathy, unspecified: Secondary | ICD-10-CM | POA: Diagnosis present

## 2018-10-06 DIAGNOSIS — L8990 Pressure ulcer of unspecified site, unspecified stage: Secondary | ICD-10-CM | POA: Diagnosis present

## 2018-10-06 DIAGNOSIS — E860 Dehydration: Secondary | ICD-10-CM | POA: Diagnosis present

## 2018-10-06 DIAGNOSIS — E1122 Type 2 diabetes mellitus with diabetic chronic kidney disease: Secondary | ICD-10-CM | POA: Diagnosis present

## 2018-10-06 DIAGNOSIS — E1165 Type 2 diabetes mellitus with hyperglycemia: Secondary | ICD-10-CM | POA: Diagnosis present

## 2018-10-06 DIAGNOSIS — F329 Major depressive disorder, single episode, unspecified: Secondary | ICD-10-CM | POA: Diagnosis present

## 2018-10-06 DIAGNOSIS — T464X5A Adverse effect of angiotensin-converting-enzyme inhibitors, initial encounter: Secondary | ICD-10-CM | POA: Diagnosis present

## 2018-10-06 DIAGNOSIS — Z20828 Contact with and (suspected) exposure to other viral communicable diseases: Secondary | ICD-10-CM | POA: Diagnosis present

## 2018-10-06 DIAGNOSIS — F411 Generalized anxiety disorder: Secondary | ICD-10-CM | POA: Diagnosis present

## 2018-10-06 DIAGNOSIS — Z6841 Body Mass Index (BMI) 40.0 and over, adult: Secondary | ICD-10-CM | POA: Diagnosis not present

## 2018-10-06 DIAGNOSIS — M25569 Pain in unspecified knee: Secondary | ICD-10-CM | POA: Diagnosis present

## 2018-10-06 DIAGNOSIS — Z7401 Bed confinement status: Secondary | ICD-10-CM | POA: Diagnosis not present

## 2018-10-06 DIAGNOSIS — L98499 Non-pressure chronic ulcer of skin of other sites with unspecified severity: Secondary | ICD-10-CM | POA: Diagnosis present

## 2018-10-06 LAB — URINE CULTURE: Culture: <10000 — AB

## 2018-10-06 LAB — BASIC METABOLIC PANEL
Anion gap: 5 (ref 5–15)
Anion gap: 12 (ref 5–15)
BUN: 32 mg/dL — ABNORMAL HIGH (ref 8–23)
BUN: 34 mg/dL — ABNORMAL HIGH (ref 8–23)
CO2: 22 mmol/L (ref 22–32)
CO2: 18 mmol/L — ABNORMAL LOW (ref 22–32)
Calcium: 8.9 mg/dL (ref 8.9–10.3)
Calcium: 9.3 mg/dL (ref 8.9–10.3)
Chloride: 110 mmol/L (ref 98–111)
Chloride: 109 mmol/L (ref 98–111)
Creatinine, Ser: 1.67 mg/dL — ABNORMAL HIGH (ref 0.44–1.00)
Creatinine, Ser: 1.75 mg/dL — ABNORMAL HIGH (ref 0.44–1.00)
GFR calc Af Amer: 37 mL/min — ABNORMAL LOW (ref >60)
GFR calc Af Amer: 35 mL/min — ABNORMAL LOW (ref >60)
GFR calc non Af Amer: 32 mL/min — ABNORMAL LOW (ref >60)
GFR calc non Af Amer: 30 mL/min — ABNORMAL LOW (ref >60)
Glucose, Bld: 100 mg/dL — ABNORMAL HIGH (ref 70–99)
Glucose, Bld: 104 mg/dL — ABNORMAL HIGH (ref 70–99)
Potassium: 5.7 mmol/L — ABNORMAL HIGH (ref 3.5–5.1)
Potassium: 6.1 mmol/L — ABNORMAL HIGH (ref 3.5–5.1)
Sodium: 137 mmol/L (ref 135–145)
Sodium: 139 mmol/L (ref 135–145)

## 2018-10-06 LAB — GLUCOSE, CAPILLARY
Glucose-Capillary: 96 mg/dL (ref 70–99)
Glucose-Capillary: 105 mg/dL — ABNORMAL HIGH (ref 70–99)
Glucose-Capillary: 154 mg/dL — ABNORMAL HIGH (ref 70–99)
Glucose-Capillary: 116 mg/dL — ABNORMAL HIGH (ref 70–99)
Glucose-Capillary: 128 mg/dL — ABNORMAL HIGH (ref 70–99)

## 2018-10-06 LAB — HIV ANTIBODY (ROUTINE TESTING W REFLEX): HIV Screen 4th Generation wRfx: NONREACTIVE

## 2018-10-06 MED ORDER — SODIUM POLYSTYRENE SULFONATE 15 GM/60ML PO SUSP
30.0000 g | Freq: Once | ORAL | Status: AC
  Administered 2018-10-06: 30 g via ORAL
  Filled 2018-10-06: qty 120

## 2018-10-06 MED ORDER — HYDROXYZINE HCL 50 MG PO TABS
50.0000 mg | ORAL_TABLET | Freq: Three times a day (TID) | ORAL | Status: DC
  Administered 2018-10-06 – 2018-10-09 (×10): 50 mg via ORAL
  Filled 2018-10-06 (×10): qty 1

## 2018-10-06 MED ORDER — TERBINAFINE HCL 250 MG PO TABS
250.0000 mg | ORAL_TABLET | Freq: Every day | ORAL | Status: DC
  Administered 2018-10-06 – 2018-10-09 (×4): 250 mg via ORAL
  Filled 2018-10-06 (×4): qty 1

## 2018-10-06 MED ORDER — SODIUM ZIRCONIUM CYCLOSILICATE 10 G PO PACK
10.0000 g | PACK | Freq: Once | ORAL | Status: AC
  Administered 2018-10-06: 10 g via ORAL
  Filled 2018-10-06: qty 1

## 2018-10-06 NOTE — Care Management Obs Status (Signed)
[place] NOTIFICATION   Patient Details  Name: [name] [name] MRN: [id number] Date of Birth: [birth year]   Medicare Observation Status Notification Given:  Yes    Joaquin Courts, RN 10/06/2018, 1:08 PM

## 2018-10-06 NOTE — Progress Notes (Addendum)
PROGRESS NOTE    [name] [name]  OHY:[id number] DOB: 1956/01/28 DOA: 10/04/2018 PCP: System, Pcp Not In    Brief Narrative:  63-year-old lady with prior history of type 2 diabetes, hypertension, hyperlipidemia, stage IV CKD, presents with hyperkalemia and worsening drainage from multiple wounds on her abdomen torso and under the right breast.  She was admitted for cellulitis and worsening and drainage from the multiple ulcers.    Assessment & Plan:   Principal Problem:   Cellulitis Active Problems:   Hyperkalemia   Diabetes type 2, uncontrolled (HCC)   HLD (hyperlipidemia)   CKD (chronic kidney disease) stage 4, GFR 15-29 ml/min (HCC)   Myalgia   Acute cystitis without hematuria   Essential hypertension   Pressure injury of skin   Hyperkalemia Probably secondary to lisinopril and dehydration Persistent and and not responding to Kayexalate. Lokelma ordered tonight and repeat potassium in the morning.  EKG shows normal sinus rhythm. Holding lisinopril on discharge.   Mild cellulitis around the multiple skin ulcers associated with serosanguineous drainage and pain and some induration Recommend continue with IV antibiotics for another 24 hours and possibly transition to oral tomorrow Continue with dressing changes as per recommended by wound care. Patient requested not to scratch the ulcers and ordered Atarax 50 mg 3 times daily. Pain control with Percocet and gabapentin.   Stage IV CKD Creatinine appears to be at baseline at 1.7   Diabetic neuropathy Continue with gabapentin   Type 2 diabetes mellitus A1c 6.9 Continue with sliding scale insulin.  Maintain strict control of CBGs for better healing of the multiple ulcers  CBG (last 3)  Recent Labs    10/05/18 2124 10/06/18 0812 10/06/18 1146  GLUCAP 96 105* 154*        Hypertension Well-controlled  Myalgias CK levels within normal limits     DVT prophylaxis: Lovenox code Status: Full code  Family Communication: None at bedside Disposition Plan: Pending further work-up and clinical improvement and resolution of hyperkalemia   Consultants:   Wound care consult  Procedures: None Antimicrobials: Vancomycin and cefepime for the multiple skin ulcers.  Subjective: Patient was seen and examined at the time of dressing changes and she appears pretty uncomfortable and in mild distress.  Objective: Vitals:   10/05/18 2203 10/06/18 0509 10/06/18 0512 10/06/18 1332  BP: 139/60 (!) 118/52  (!) 108/50  Pulse: 80 90  88  Resp: 18 20  20   Temp: 98.3 F (36.8 C) 98.3 F (36.8 C)  98.2 F (36.8 C)  TempSrc: Oral   Oral  SpO2: 98% 93%    Weight: (!) 141.2 kg  (!) 141.7 kg   Height: 5\' 2"  (1.575 m)       Intake/Output Summary (Last 24 hours) at 10/06/2018 1613 Last data filed at 10/06/2018 1500 Gross per 24 hour  Intake 393.02 ml  Output 1600 ml  Net -1206.98 ml   Filed Weights   10/05/18 0600 10/05/18 2203 10/06/18 0512  Weight: (!) 140.1 kg (!) 141.2 kg (!) 141.7 kg    Examination:  General exam: In pain during the dressing changes Respiratory system: Clear to auscultation. Respiratory effort normal. Cardiovascular system: S1 & S2 heard, RRR. No pedal edema. Gastrointestinal system: Abdomen is soft, tender in the ulcerated area, organomegaly cannot be appreciated, bowel sounds are heard Central nervous system: Alert and oriented. No focal neurological deficits. Extremities: Trace leg edema Skin: On her abdomen patient has about 9 into 7 cm skin ulcer, multiple small ulcers under the  right breast, the left torso fold patient has 2 small skin ulcers. Psychiatry:  Mood & affect appropriate.     Data Reviewed: I have personally reviewed following labs and imaging studies  CBC: Recent Labs  Lab 10/04/18 1655 10/04/18 1809 10/05/18 0132  WBC  --  7.8  --   NEUTROABS  --  5.0  --   HGB 11.6* 10.0* 10.9*  HCT 34.0* 34.1* 32.0*  MCV  --  85.0  --   PLT  --  474*   --    Basic Metabolic Panel: Recent Labs  Lab 10/04/18 1651 10/04/18 1655 10/05/18 0132 10/05/18 1631 10/06/18 0410  NA 141 142 142 138 137  K 5.7* 5.7* 5.2* 5.9* 5.7*  CL 109 111 111 107 110  CO2 21*  --   --  21* 22  GLUCOSE 222* 211* 159* 177* 100*  BUN 30* 31* 32* 30* 32*  CREATININE 1.76* 1.70* 1.80* 1.77* 1.67*  CALCIUM 9.2  --   --  9.2 8.9  MG 1.8  --   --   --   --    GFR: Estimated Creatinine Clearance: 46 mL/min (A) (by C-G formula based on SCr of 1.67 mg/dL (H)). Liver Function Tests: Recent Labs  Lab 10/04/18 1651  AST 13*  ALT 16  ALKPHOS 112  BILITOT 0.3  PROT 7.7  ALBUMIN 3.6   Recent Labs  Lab 10/04/18 1651  LIPASE 63*   No results for input(s): AMMONIA in the last 168 hours. Coagulation Profile: No results for input(s): INR, PROTIME in the last 168 hours. Cardiac Enzymes: Recent Labs  Lab 10/04/18 1651  CKTOTAL 36*   BNP (last 3 results) No results for input(s): PROBNP in the last 8760 hours. HbA1C: Recent Labs    10/04/18 1651  HGBA1C 6.9*   CBG: Recent Labs  Lab 10/05/18 1145 10/05/18 1702 10/05/18 2124 10/06/18 0812 10/06/18 1146  GLUCAP 131* 138* 96 105* 154*   Lipid Profile: No results for input(s): CHOL, HDL, LDLCALC, TRIG, CHOLHDL, LDLDIRECT in the last 72 hours. Thyroid Function Tests: No results for input(s): TSH, T4TOTAL, FREET4, T3FREE, THYROIDAB in the last 72 hours. Anemia Panel: No results for input(s): VITAMINB12, FOLATE, FERRITIN, TIBC, IRON, RETICCTPCT in the last 72 hours. Sepsis Labs: Recent Labs  Lab 10/04/18 1809  LATICACIDVEN 1.3    Recent Results (from the past 240 hour(s))  SARS Coronavirus 2 [facility] order, Performed in [facility] hospital lab)     Status: None   Collection Time: 10/05/18  1:05 AM  Result Value Ref Range Status   SARS Coronavirus 2 NEGATIVE NEGATIVE Final    Comment: (NOTE) If result is NEGATIVE SARS-CoV-2 target nucleic acids are NOT DETECTED. The SARS-CoV-2 RNA is  generally detectable in upper and lower  respiratory specimens during the acute phase of infection. The lowest  concentration of SARS-CoV-2 viral copies this assay can detect is 250  copies / mL. A negative result does not preclude SARS-CoV-2 infection  and should not be used as the sole basis for treatment or other  patient management decisions.  A negative result may occur with  improper specimen collection / handling, submission of specimen other  than nasopharyngeal swab, presence of viral mutation(s) within the  areas targeted by this assay, and inadequate number of viral copies  (<250 copies / mL). A negative result must be combined with clinical  observations, patient history, and epidemiological information. If result is POSITIVE SARS-CoV-2 target nucleic acids are DETECTED. The SARS-CoV-2 RNA  is generally detectable in upper and lower  respiratory specimens dur ing the acute phase of infection.  Positive  results are indicative of active infection with SARS-CoV-2.  Clinical  correlation with patient history and other diagnostic information is  necessary to determine patient infection status.  Positive results do  not rule out bacterial infection or co-infection with other viruses. If result is PRESUMPTIVE POSTIVE SARS-CoV-2 nucleic acids MAY BE PRESENT.   A presumptive positive result was obtained on the submitted specimen  and confirmed on repeat testing.  While 2019 novel coronavirus  (SARS-CoV-2) nucleic acids may be present in the submitted sample  additional confirmatory testing may be necessary for epidemiological  and / or clinical management purposes  to differentiate between  SARS-CoV-2 and other Sarbecovirus currently known to infect humans.  If clinically indicated additional testing with an alternate test  methodology [phone]) is advised. The SARS-CoV-2 RNA is generally  detectable in upper and lower respiratory sp ecimens during the acute  phase of infection.  The expected result is Negative. Fact Sheet for Patients:  StrictlyIdeas.no Fact Sheet for Healthcare Providers: BankingDealers.co.za This test is not yet approved or cleared by the [place] FDA and has been authorized for detection and/or diagnosis of SARS-CoV-2 by FDA under an Emergency Use Authorization (EUA).  This EUA will remain in effect (meaning this test can be used) for the duration of the COVID-19 declaration under Section 564(b)(1) of the Act, 21 U.S.C. section 360bbb-3(b)(1), unless the authorization is terminated or revoked sooner. Performed at [facility], [place] [address] [place], [place] [address]   Urine culture     Status: Abnormal   Collection Time: 10/05/18  1:13 AM   Specimen: Urine, Random  Result Value Ref Range Status   Specimen Description   Final    URINE, RANDOM Performed at [place] [address] [place], [place] [address]    Special Requests   Final    NONE Performed at [facility], [place] [address], [place], [place] [address]    Culture (A)  Final    <10,000 COLONIES/mL INSIGNIFICANT GROWTH Performed at [place] [address] [place], [place] [address]    Report Status 10/06/2018 FINAL  Final  Blood culture (routine x 2)     Status: None (Preliminary result)   Collection Time: 10/05/18  1:21 AM   Specimen: BLOOD  Result Value Ref Range Status   Specimen Description   Final    BLOOD RIGHT ANTECUBITAL Performed at [place] [address], [place], [place] [address]    Special Requests   Final    BOTTLES DRAWN AEROBIC AND ANAEROBIC Blood Culture adequate volume Performed at [place] [address] [place], [place] [address]    Culture   Final    NO GROWTH 1 DAY Performed at [facility], [place] [address], [place], [place] [address]    Report Status PENDING   Incomplete         Radiology Studies: Dg Chest Portable 1 View  Result Date: 10/04/2018 CLINICAL DATA:  Decreased diuretics, increasing shortness of breath EXAM: PORTABLE CHEST 1 VIEW COMPARISON:  Radiograph Jun 29, 2014 FINDINGS: Patient body habitus results in suboptimal penetration of the lower lung bases. Accounting for body habitus, the lungs are clear. No consolidation, features of edema, pneumothorax, or effusion. Prominence of the cardiac silhouette likely related to portable technique. Postsurgical changes from prior right distal clavicular  resection. No acute osseous or soft tissue abnormality. IMPRESSION: Accounting for body habitus and suboptimal projection, the lungs are grossly clear Electronically Signed   By: Lovena Le M.D.   On: 10/04/2018 17:04        Scheduled Meds: . enoxaparin (LOVENOX) injection  60 mg Subcutaneous Q24H  . gabapentin  600 mg Oral TID  . hydrOXYzine  50 mg Oral TID  . insulin aspart  0-9 Units Subcutaneous TID WC   Continuous Infusions: . ceFEPime (MAXIPIME) IV Stopped (10/06/18 1321)  . [START ON 10/07/2018] vancomycin       LOS: 0 days    Time spent: 32 minutes    Hosie Poisson, MD [facility] Pager [phone]  If 7PM-7AM, please contact night-coverage [email or web address] Password [facility] 10/06/2018, 4:13 PM

## 2018-10-06 NOTE — Care Management Obs Status (Signed)
[place] NOTIFICATION   Patient Details  Name: DAY GREB MRN: [id number] Date of Birth: [birth year]   Medicare Observation Status Notification Given:       Joaquin Courts, RN 10/06/2018, 1:07 PM

## 2018-10-07 LAB — CBC
HCT: 30.4 % — ABNORMAL LOW (ref 36.0–46.0)
Hemoglobin: 9.1 g/dL — ABNORMAL LOW (ref 12.0–15.0)
MCH: 24.7 pg — ABNORMAL LOW (ref 26.0–34.0)
MCHC: 29.9 g/dL — ABNORMAL LOW (ref 30.0–36.0)
MCV: 82.6 fL (ref 80.0–100.0)
Platelets: 465 10*3/uL — ABNORMAL HIGH (ref 150–400)
RBC: 3.68 MIL/uL — ABNORMAL LOW (ref 3.87–5.11)
RDW: 15.4 % (ref 11.5–15.5)
WBC: 7.3 10*3/uL (ref 4.0–10.5)
nRBC: 0 % (ref 0.0–0.2)

## 2018-10-07 LAB — BASIC METABOLIC PANEL
Anion gap: 10 (ref 5–15)
Anion gap: 6 (ref 5–15)
BUN: 37 mg/dL — ABNORMAL HIGH (ref 8–23)
BUN: 37 mg/dL — ABNORMAL HIGH (ref 8–23)
CO2: 18 mmol/L — ABNORMAL LOW (ref 22–32)
CO2: 23 mmol/L (ref 22–32)
Calcium: 8.8 mg/dL — ABNORMAL LOW (ref 8.9–10.3)
Calcium: 9.1 mg/dL (ref 8.9–10.3)
Chloride: 111 mmol/L (ref 98–111)
Chloride: 109 mmol/L (ref 98–111)
Creatinine, Ser: 1.64 mg/dL — ABNORMAL HIGH (ref 0.44–1.00)
Creatinine, Ser: 1.88 mg/dL — ABNORMAL HIGH (ref 0.44–1.00)
GFR calc Af Amer: 38 mL/min — ABNORMAL LOW (ref >60)
GFR calc Af Amer: 32 mL/min — ABNORMAL LOW (ref >60)
GFR calc non Af Amer: 32 mL/min — ABNORMAL LOW (ref >60)
GFR calc non Af Amer: 28 mL/min — ABNORMAL LOW (ref >60)
Glucose, Bld: 113 mg/dL — ABNORMAL HIGH (ref 70–99)
Glucose, Bld: 133 mg/dL — ABNORMAL HIGH (ref 70–99)
Potassium: 5.7 mmol/L — ABNORMAL HIGH (ref 3.5–5.1)
Potassium: 5.4 mmol/L — ABNORMAL HIGH (ref 3.5–5.1)
Sodium: 139 mmol/L (ref 135–145)
Sodium: 138 mmol/L (ref 135–145)

## 2018-10-07 LAB — GLUCOSE, CAPILLARY
Glucose-Capillary: 117 mg/dL — ABNORMAL HIGH (ref 70–99)
Glucose-Capillary: 150 mg/dL — ABNORMAL HIGH (ref 70–99)
Glucose-Capillary: 165 mg/dL — ABNORMAL HIGH (ref 70–99)
Glucose-Capillary: 125 mg/dL — ABNORMAL HIGH (ref 70–99)

## 2018-10-07 MED ORDER — SODIUM ZIRCONIUM CYCLOSILICATE 10 G PO PACK
10.0000 g | PACK | Freq: Two times a day (BID) | ORAL | Status: DC
  Administered 2018-10-07 – 2018-10-08 (×3): 10 g via ORAL
  Filled 2018-10-07 (×3): qty 1

## 2018-10-07 MED ORDER — FUROSEMIDE 10 MG/ML IJ SOLN
40.0000 mg | Freq: Once | INTRAMUSCULAR | Status: AC
  Administered 2018-10-07: 40 mg via INTRAVENOUS
  Filled 2018-10-07: qty 4

## 2018-10-07 MED ORDER — CALCIUM GLUCONATE-NACL 1-0.675 GM/50ML-% IV SOLN
1.0000 g | Freq: Once | INTRAVENOUS | Status: AC
  Administered 2018-10-07: 1000 mg via INTRAVENOUS
  Filled 2018-10-07: qty 50

## 2018-10-07 MED ORDER — ENOXAPARIN SODIUM 40 MG/0.4ML ~~LOC~~ SOLN
40.0000 mg | Freq: Every day | SUBCUTANEOUS | Status: DC
  Administered 2018-10-07 – 2018-10-09 (×3): 40 mg via SUBCUTANEOUS
  Filled 2018-10-07 (×3): qty 0.4

## 2018-10-07 NOTE — Progress Notes (Signed)
PROGRESS NOTE    KALYANI MAEDA  WUX:[id number] DOB: Nov 22, 1955 DOA: 10/04/2018 PCP: System, Pcp Not In    Brief Narrative:  63-year-old lady with prior history of type 2 diabetes, hypertension, hyperlipidemia, stage IV CKD, presents with hyperkalemia and worsening drainage from multiple wounds on her abdomen torso and under the right breast.  She was admitted for cellulitis and worsening and drainage from the multiple ulcers. Her potassium is persistently high, hence lokelma started and one dose of lasix ordered today along with calcium gluconate.      Assessment & Plan:   Principal Problem:   Cellulitis Active Problems:   Hyperkalemia   Diabetes type 2, uncontrolled (HCC)   HLD (hyperlipidemia)   CKD (chronic kidney disease) stage 4, GFR 15-29 ml/min (HCC)   Myalgia   Acute cystitis without hematuria   Essential hypertension   Pressure injury of skin   Hyperkalemia Probably secondary to lisinopril and dehydration Persistent and and not responding to Kayexalate. lokelma ordered along with oen dose of lasix today. Repeat potassium tonight and continue to treat.   EKG shows normal sinus rhythm. Holding lisinopril on discharge.   Mild cellulitis around the multiple skin ulcers associated with serosanguineous drainage and pain and some induration Since patient reports pain at the site , will continue the broad spectrum antibiotics.  The sero sanguinous drainage appears to have minimized.  Continue with dressing changes as per recommended by wound care. Patient requested not to scratch the ulcers and ordered Atarax 50 mg 3 times daily. Pain control with Percocet and gabapentin.   Stage IV CKD Creatinine appears to be at baseline at 1.7 Creatinine is at 1.64.    Diabetic neuropathy Continue with gabapentin   Type 2 diabetes mellitus A1c 6.9 Continue with sliding scale insulin.  Maintain strict control of CBGs for better healing of the multiple ulcers  CBG (last  3)  Recent Labs    10/07/18 0747 10/07/18 1247 10/07/18 1619  GLUCAP 117* 150* 165*   No change in medications.    Hypertension Well-controlled  Myalgias CK levels within normal limits. Pain control and recommended for the patient to get out of bed and mobilize. PT evaluation ordered.    DVT prophylaxis: Lovenox code Status: Full code Family Communication: None at bedside Disposition Plan: pending resolution of hyperkalemia.    Consultants:   Wound care consult  Procedures: None Antimicrobials: Vancomycin and cefepime for the multiple skin ulcers since admission.   Subjective: Pt reports she feels miserable, myalgias, pain at the site of ulcers.  No chest pain or sob.   Objective: Vitals:   10/06/18 1332 10/06/18 2034 10/07/18 0520 10/07/18 1312  BP: (!) 108/50 (!) 106/92 (!) 115/50 108/90  Pulse: 88 95 89 86  Resp: 20 18 18 18   Temp: 98.2 F (36.8 C)  98.4 F (36.9 C) 99 F (37.2 C)  TempSrc: Oral  Oral Oral  SpO2:  98% 100% 96%  Weight:   (!) 141.7 kg   Height:        Intake/Output Summary (Last 24 hours) at 10/07/2018 1739 Last data filed at 10/07/2018 0830 Gross per 24 hour  Intake 660 ml  Output 853 ml  Net -193 ml   Filed Weights   10/05/18 2203 10/06/18 0512 10/07/18 0520  Weight: (!) 141.2 kg (!) 141.7 kg (!) 141.7 kg    Examination:  General exam: uncomfortable, mild distress  Respiratory system: ar entry fair , no wheezing or rhonchi.  Cardiovascular system: S1 & S2  heard, RRR, TRACE pedal edema.  Gastrointestinal system: Abdomen is soft, tender in the mid abdomen where her ulcer is , bowel sounds good.  Central nervous system: Alert and oriented to place and person, no focal deficits.  Extremities: Trace leg edema, no cyanosis or clubbing.  Skin: On her abdomen patient has about 9 into 7 cm skin ulcer, multiple small ulcers under the right breast, the left torso fold patient has 2 small skin ulcers., no changes in the ulcer.   Psychiatry:  Mood appropriate.     Data Reviewed: I have personally reviewed following labs and imaging studies  CBC: Recent Labs  Lab 10/04/18 1655 10/04/18 1809 10/05/18 0132 10/07/18 1113  WBC  --  7.8  --  7.3  NEUTROABS  --  5.0  --   --   HGB 11.6* 10.0* 10.9* 9.1*  HCT 34.0* 34.1* 32.0* 30.4*  MCV  --  85.0  --  82.6  PLT  --  474*  --  324*   Basic Metabolic Panel: Recent Labs  Lab 10/04/18 1651  10/05/18 0132 10/05/18 1631 10/06/18 0410 10/06/18 1837 10/07/18 0530  NA 141   < > 142 138 137 139 139  K 5.7*   < > 5.2* 5.9* 5.7* 6.1* 5.7*  CL 109   < > 111 107 110 109 111  CO2 21*  --   --  21* 22 18* 18*  GLUCOSE 222*   < > 159* 177* 100* 104* 113*  BUN 30*   < > 32* 30* 32* 34* 37*  CREATININE 1.76*   < > 1.80* 1.77* 1.67* 1.75* 1.64*  CALCIUM 9.2  --   --  9.2 8.9 9.3 8.8*  MG 1.8  --   --   --   --   --   --    < > = values in this interval not displayed.   GFR: Estimated Creatinine Clearance: 46.8 mL/min (A) (by C-G formula based on SCr of 1.64 mg/dL (H)). Liver Function Tests: Recent Labs  Lab 10/04/18 1651  AST 13*  ALT 16  ALKPHOS 112  BILITOT 0.3  PROT 7.7  ALBUMIN 3.6   Recent Labs  Lab 10/04/18 1651  LIPASE 63*   No results for input(s): AMMONIA in the last 168 hours. Coagulation Profile: No results for input(s): INR, PROTIME in the last 168 hours. Cardiac Enzymes: Recent Labs  Lab 10/04/18 1651  CKTOTAL 36*   BNP (last 3 results) No results for input(s): PROBNP in the last 8760 hours. HbA1C: No results for input(s): HGBA1C in the last 72 hours. CBG: Recent Labs  Lab 10/06/18 1640 10/06/18 2039 10/07/18 0747 10/07/18 1247 10/07/18 1619  GLUCAP 116* 128* 117* 150* 165*   Lipid Profile: No results for input(s): CHOL, HDL, LDLCALC, TRIG, CHOLHDL, LDLDIRECT in the last 72 hours. Thyroid Function Tests: No results for input(s): TSH, T4TOTAL, FREET4, T3FREE, THYROIDAB in the last 72 hours. Anemia Panel: No results for  input(s): VITAMINB12, FOLATE, FERRITIN, TIBC, IRON, RETICCTPCT in the last 72 hours. Sepsis Labs: Recent Labs  Lab 10/04/18 1809  LATICACIDVEN 1.3    Recent Results (from the past 240 hour(s))  SARS Coronavirus 2 [facility] order, Performed in [facility] hospital lab)     Status: None   Collection Time: 10/05/18  1:05 AM  Result Value Ref Range Status   SARS Coronavirus 2 NEGATIVE NEGATIVE Final    Comment: (NOTE) If result is NEGATIVE SARS-CoV-2 target nucleic acids are NOT DETECTED. The SARS-CoV-2 RNA  is generally detectable in upper and lower  respiratory specimens during the acute phase of infection. The lowest  concentration of SARS-CoV-2 viral copies this assay can detect is 250  copies / mL. A negative result does not preclude SARS-CoV-2 infection  and should not be used as the sole basis for treatment or other  patient management decisions.  A negative result may occur with  improper specimen collection / handling, submission of specimen other  than nasopharyngeal swab, presence of viral mutation(s) within the  areas targeted by this assay, and inadequate number of viral copies  (<250 copies / mL). A negative result must be combined with clinical  observations, patient history, and epidemiological information. If result is POSITIVE SARS-CoV-2 target nucleic acids are DETECTED. The SARS-CoV-2 RNA is generally detectable in upper and lower  respiratory specimens dur ing the acute phase of infection.  Positive  results are indicative of active infection with SARS-CoV-2.  Clinical  correlation with patient history and other diagnostic information is  necessary to determine patient infection status.  Positive results do  not rule out bacterial infection or co-infection with other viruses. If result is PRESUMPTIVE POSTIVE SARS-CoV-2 nucleic acids MAY BE PRESENT.   A presumptive positive result was obtained on the submitted specimen  and confirmed on repeat testing.  While  2019 novel coronavirus  (SARS-CoV-2) nucleic acids may be present in the submitted sample  additional confirmatory testing may be necessary for epidemiological  and / or clinical management purposes  to differentiate between  SARS-CoV-2 and other Sarbecovirus currently known to infect humans.  If clinically indicated additional testing with an alternate test  methodology [phone]) is advised. The SARS-CoV-2 RNA is generally  detectable in upper and lower respiratory sp ecimens during the acute  phase of infection. The expected result is Negative. Fact Sheet for Patients:  StrictlyIdeas.no Fact Sheet for Healthcare Providers: BankingDealers.co.za This test is not yet approved or cleared by the [place] FDA and has been authorized for detection and/or diagnosis of SARS-CoV-2 by FDA under an Emergency Use Authorization (EUA).  This EUA will remain in effect (meaning this test can be used) for the duration of the COVID-19 declaration under Section 564(b)(1) of the Act, 21 U.S.C. section 360bbb-3(b)(1), unless the authorization is terminated or revoked sooner. Performed at [facility], [place] [address], [place], [place] [address]   Urine culture     Status: Abnormal   Collection Time: 10/05/18  1:13 AM   Specimen: Urine, Random  Result Value Ref Range Status   Specimen Description   Final    URINE, RANDOM Performed at [place] [address] [place], [place] [address]    Special Requests   Final    NONE Performed at [facility], [place] [address] [place], [place] [address]    Culture (A)  Final    <10,000 COLONIES/mL INSIGNIFICANT GROWTH Performed at [facility] [address], [place], [place] [address]    Report Status 10/06/2018 FINAL  Final  Blood culture (routine x 2)     Status: None (Preliminary result)   Collection Time: 10/05/18  1:21 AM   Specimen:  BLOOD  Result Value Ref Range Status   Specimen Description   Final    BLOOD RIGHT ANTECUBITAL Performed at [facility] [address], [place], [place] [address]    Special Requests   Final    BOTTLES DRAWN AEROBIC AND ANAEROBIC Blood Culture adequate volume Performed  at [facility], [place] [address] [place], [place] [address]    Culture   Final    NO GROWTH 2 DAYS Performed at [facility] [address], [place], [place] [address]    Report Status PENDING  Incomplete         Radiology Studies: No results found.      Scheduled Meds: . enoxaparin (LOVENOX) injection  40 mg Subcutaneous Daily  . gabapentin  600 mg Oral TID  . hydrOXYzine  50 mg Oral TID  . insulin aspart  0-9 Units Subcutaneous TID WC  . sodium zirconium cyclosilicate  10 g Oral BID  . terbinafine  250 mg Oral Daily   Continuous Infusions: . ceFEPime (MAXIPIME) IV 2 g (10/07/18 1231)  . vancomycin 1,750 mg (10/07/18 0953)     LOS: 1 day    Time spent: 28 minutes    Hosie Poisson, MD [facility] Pager [phone]  If 7PM-7AM, please contact night-coverage [email or web address] Password [facility] 10/07/2018, 5:39 PM

## 2018-10-08 LAB — BASIC METABOLIC PANEL
Anion gap: 10 (ref 5–15)
BUN: 39 mg/dL — ABNORMAL HIGH (ref 8–23)
CO2: 21 mmol/L — ABNORMAL LOW (ref 22–32)
Calcium: 8.8 mg/dL — ABNORMAL LOW (ref 8.9–10.3)
Chloride: 107 mmol/L (ref 98–111)
Creatinine, Ser: 1.77 mg/dL — ABNORMAL HIGH (ref 0.44–1.00)
GFR calc Af Amer: 34 mL/min — ABNORMAL LOW (ref >60)
GFR calc non Af Amer: 30 mL/min — ABNORMAL LOW (ref >60)
Glucose, Bld: 176 mg/dL — ABNORMAL HIGH (ref 70–99)
Potassium: 4.8 mmol/L (ref 3.5–5.1)
Sodium: 138 mmol/L (ref 135–145)

## 2018-10-08 LAB — GLUCOSE, CAPILLARY
Glucose-Capillary: 105 mg/dL — ABNORMAL HIGH (ref 70–99)
Glucose-Capillary: 157 mg/dL — ABNORMAL HIGH (ref 70–99)
Glucose-Capillary: 150 mg/dL — ABNORMAL HIGH (ref 70–99)
Glucose-Capillary: 171 mg/dL — ABNORMAL HIGH (ref 70–99)

## 2018-10-08 MED ORDER — ACETAMINOPHEN 325 MG PO TABS: 650.0000 mg | ORAL_TABLET | Freq: Four times a day (QID) | ORAL | Status: DC | PRN

## 2018-10-08 NOTE — Progress Notes (Signed)
Pharmacy Antibiotic Note  [name] [name] is a 63 y.o. female admitted on 10/04/2018 with multiple wounds with discharge concerning for infection.  Pharmacy has been consulted for vancomycin and cefepime dosing.  Pt has PCN allergy listed on chart. Night shift pharmacist d/w PA - patient confirmed taking cephalexin recently and tolerated.   10/08/2018 Scr 1.77, CrCl ~ 43.38mls/min WBC 7.3 afebrile  Plan: -Continue Vancomycin 1750 mg IV Q 48 hrs. Goal AUC 400-550. -Expected AUC: 502.6 SCr used: 1.8 -Expected AUC: 479.4 SCr used: 1.7,Vd 0.5 for BMI 56 -Continue Cefepime 2 gm IV q12  -F/u renal function, WBC, temp, culture data -Vancomycin levels as needed  Height: 5\' 2"  (157.5 cm) Weight: (!) 311 lb 6.4 oz (141.3 kg) IBW/kg (Calculated) : 50.1  Temp (24hrs), Avg:98.5 F (36.9 C), Min:98.2 F (36.8 C), Max:98.6 F (37 C)  Recent Labs  Lab 10/04/18 1809  10/06/18 0410 10/06/18 1837 10/07/18 0530 10/07/18 1113 10/07/18 1942 10/08/18 0353  WBC 7.8  --   --   --   --  7.3  --   --   CREATININE  --    < > 1.67* 1.75* 1.64*  --  1.88* 1.77*  LATICACIDVEN 1.3  --   --   --   --   --   --   --    < > = values in this interval not displayed.    Estimated Creatinine Clearance: 43.3 mL/min (A) (by C-G formula based on SCr of 1.77 mg/dL (H)).    Allergies  Allergen Reactions  . Morphine Rash  . Penicillins Itching, Nausea And Vomiting, Rash and Hives    Has patient had a PCN reaction causing immediate rash, facial/tongue/throat swelling, SOB or lightheadedness with hypotension: No Has patient had a PCN reaction causing severe rash involving mucus membranes or skin necrosis: No Has patient had a PCN reaction that required hospitalization: No Has patient had a PCN reaction occurring within the last 10 years: No If all of the above answers are "NO", then may proceed with Cephalosporin use.  Has patient had a PCN reaction causing immediate rash, facial/tongue/throat swelling, SOB  or lightheadedness with hypotension: No Has patient had a PCN reaction causing severe rash involving mucus membranes or skin necrosis: No Has patient had a PCN reaction that required hospitalization: No Has patient had a PCN reaction occurring within the last 10 years: No If all of the above answers are "NO", then may proceed with Cephalosporin use.   Marland Kitchen Morphine And Related Itching    Antimicrobials this admission: 8/14 vanc >> 8/14 cefepime>> Dose adjustments this admission:  Microbiology results: 8/14 BCx2: ngtd 8/14 UCx: <10k colonies 8/14 Covid: neg  Thank you for allowing pharmacy to be a part of this patient's care.  Dolly Rias RPh 10/08/2018, 2:23 PM

## 2018-10-08 NOTE — Evaluation (Signed)
Physical Therapy Evaluation Patient Details Name: [name] [name] MRN: [id number] DOB: 1955/12/05 Today's Date: 10/08/2018   History of Present Illness  Pt is a 63 year old female with PMHx significant for type 2 diabetes, hypertension, hyperlipidemia, stage IV CKD, chronic knee pain and presents with hyperkalemia and worsening drainage from multiple wounds on her abdomen torso and under the right breast.  She was admitted for cellulitis and worsening and drainage from the multiple ulcers  Clinical Impression  Pt admitted with above diagnosis.  Pt currently with functional limitations due to the deficits listed below (see PT Problem List). Pt will benefit from skilled PT to increase their independence and safety with mobility to allow discharge to the venue listed below.  Pt reports living alone and having family assist her with mobility and MD appointments.  Pt reports history of frequent falls even while being assisted.  Pt would benefit from d/c to SNF prior to home alone.     Follow Up Recommendations SNF    Equipment Recommendations  None recommended by PT    Recommendations for Other Services       Precautions / Restrictions Precautions Precautions: Fall Restrictions Weight Bearing Restrictions: No      Mobility  Bed Mobility Overal bed mobility: Needs Assistance Bed Mobility: Supine to Sit;Sit to Supine     Supine to sit: Mod assist Sit to supine: Mod assist   General bed mobility comments: verbal cues for technique, assist for trunk upright, pt utilized bed rail to self assist as able however Left sided abdomen pain limiting; assist for LEs onto bed upon return to supine  Transfers                 General transfer comment: pt declined to attempt today stating fear of falling (and feels she would need +2)  Ambulation/Gait                Stairs            Wheelchair Mobility    Modified Rankin (Stroke Patients Only)       Balance  Overall balance assessment: Needs assistance;History of Falls Sitting-balance support: No upper extremity supported;Feet supported Sitting balance-Leahy Scale: Fair         Standing balance comment: Pt reports multiple falls at home even with assist due to neuropathy in feet and pt reports L LE has to "warm up"                             Pertinent Vitals/Pain Pain Assessment: Faces Faces Pain Scale: Hurts whole lot Pain Location: left induration area on abdomen Pain Descriptors / Indicators: Tender Pain Intervention(s): Monitored during session;Repositioned    Home Living Family/patient expects to be discharged to:: Private residence Living Arrangements: Alone Available Help at Discharge: Family;Available PRN/intermittently Type of Home: Apartment Home Access: Ramped entrance     Home Layout: One level Home Equipment: Walker - 4 wheels;Hospital bed      Prior Function Level of Independence: Independent with assistive device(s);Needs assistance   Gait / Transfers Assistance Needed: has family assist to ambulate to bathroom, HHA; goes to MD appointments seated on rollator           Hand Dominance        Extremity/Trunk Assessment        Lower Extremity Assessment Lower Extremity Assessment: Generalized weakness;RLE deficits/detail RLE Sensation: history of peripheral neuropathy LLE Deficits / Details: reports weakness  and shaking in L LE with mobility - causes falls sometimes at home LLE Sensation: history of peripheral neuropathy       Communication   Communication: No difficulties  Cognition Arousal/Alertness: Awake/alert Behavior During Therapy: WFL for tasks assessed/performed Overall Cognitive Status: Within Functional Limits for tasks assessed                                        General Comments      Exercises     Assessment/Plan    PT Assessment Patient needs continued PT services  PT Problem List Decreased  strength;Decreased mobility;Decreased activity tolerance;Decreased balance;Decreased knowledge of use of DME       PT Treatment Interventions Functional mobility training;DME instruction;Therapeutic exercise;Gait training;Balance training;Therapeutic activities;Patient/family education    PT Goals (Current goals can be found in the Care Plan section)  Acute Rehab PT Goals PT Goal Formulation: With patient Time For Goal Achievement: 10/22/18 Potential to Achieve Goals: Good    Frequency Min 2X/week   Barriers to discharge        Co-evaluation               AM-PAC PT "6 Clicks" Mobility  Outcome Measure Help needed turning from your back to your side while in a flat bed without using bedrails?: A Lot Help needed moving from lying on your back to sitting on the side of a flat bed without using bedrails?: A Lot Help needed moving to and from a bed to a chair (including a wheelchair)?: A Lot Help needed standing up from a chair using your arms (e.g., wheelchair or bedside chair)?: Total Help needed to walk in hospital room?: Total Help needed climbing 3-5 steps with a railing? : Total 6 Click Score: 9    End of Session   Activity Tolerance: Patient tolerated treatment well Patient left: in bed;with call bell/phone within reach Nurse Communication: Mobility status PT Visit Diagnosis: Other abnormalities of gait and mobility (R26.89)    Time: 6153-7943 PT Time Calculation (min) (ACUTE ONLY): 28 min   Charges:   PT Evaluation $PT Eval Low Complexity: [place], PT, DPT Acute Rehabilitation Services Office: [phone] Pager: [phone]  Trena Platt 10/08/2018, 12:44 PM

## 2018-10-08 NOTE — Progress Notes (Signed)
Patient is sleeping soundly O2 sat down 60-75.  O2  @ 2L placed on patient sats increased to 100 %.

## 2018-10-08 NOTE — TOC Progression Note (Signed)
Transition of Care [facility]) - Progression Note    Patient Details  Name: KEVINA PILOTO MRN: [id number] Date of Birth: 01-16-1956  [facility]) CM/SW Contact  [name], RN Phone Number: 10/08/2018, 2:55 PM  Clinical Narrative:    Pt declined SNF. Working on finding [facility] for pt.        Expected Discharge Plan and Services                                                 Social Determinants of Health (SDOH) Interventions    Readmission Risk Interventions No flowsheet data found.

## 2018-10-08 NOTE — Progress Notes (Signed)
PROGRESS NOTE    [name] [name]  PTW:[id number] DOB: 04/30/1955 DOA: 10/04/2018 PCP: System, Pcp Not In    Brief Narrative:  63-year-old lady with prior history of type 2 diabetes, hypertension, hyperlipidemia, stage IV CKD, presents with hyperkalemia and worsening drainage from multiple wounds on her abdomen torso and under the right breast.  She was admitted for cellulitis and worsening and drainage from the multiple ulcers and hyperkalemia.   Hyperkalemia resolved with lokelma and IV lasix. She was started on broad spectrum IV antibiotics for the  draining ulcers. PT evaluation recommended SNF, but pt refused, currently waiting for home health to be set up for discharge.      Assessment & Plan:   Principal Problem:   Cellulitis Active Problems:   Hyperkalemia   Diabetes type 2, uncontrolled (HCC)   HLD (hyperlipidemia)   CKD (chronic kidney disease) stage 4, GFR 15-29 ml/min (HCC)   Myalgia   Acute cystitis without hematuria   Essential hypertension   Pressure injury of skin   Hyperkalemia Probably secondary to lisinopril and dehydration  was persistent.  lokelma ordered along with oen dose of lasix which has normalized potassium. EKG shows normal sinus rhythm. Holding lisinopril on discharge.   Mild cellulitis around the multiple skin ulcers associated with serosanguineous drainage and pain and some induration. The pain and draining improved. D/c antibiotics after tomorrow's dose.  The sero sanguinous drainage appears to have minimized.  Continue with dressing changes as per recommended by wound care. Patient requested not to scratch the ulcers and ordered Atarax 50 mg 3 times daily. Pain control with Percocet and gabapentin.   Stage IV CKD Creatinine appears to be at baseline at 1.7    Diabetic neuropathy Continue with gabapentin   Type 2 diabetes mellitus A1c 6.9 Continue with sliding scale insulin.  Maintain strict control of CBGs for better healing  of the multiple ulcers  CBG (last 3)  Recent Labs    10/08/18 0741 10/08/18 1150 10/08/18 1628  GLUCAP 105* 157* 150*   No change in medications.    Hypertension Well-controlled  Myalgias CK levels within normal limits. Pain control and recommended for the patient to get out of bed and mobilize. PT evaluation ordered and recommended SNF, but pt refused and wanted to go home.  Currently waiting for [facility] to be set up for discharge.    DVT prophylaxis: Lovenox code Status: Full code Family Communication: None at bedside Disposition Plan: pending resolution of hyperkalemia.    Consultants:   Wound care consult  Procedures: None Antimicrobials: Vancomycin and cefepime for the multiple skin ulcers since admission.   Subjective: She is in good spirits. No chest pain or sob. Pain controlled with gabapentin.   Objective: Vitals:   10/07/18 1312 10/07/18 2053 10/08/18 0444 10/08/18 1402  BP: 108/90 (!) 133/53 (!) 114/45 (!) 107/52  Pulse: 86 97 88 90  Resp: 18 18 20 16   Temp: 99 F (37.2 C) 98.6 F (37 C) 98.2 F (36.8 C) 98.6 F (37 C)  TempSrc: Oral Oral Oral Oral  SpO2: 96% 93% 93% 96%  Weight:   (!) 141.3 kg   Height:        Intake/Output Summary (Last 24 hours) at 10/08/2018 1832 Last data filed at 10/08/2018 1403 Gross per 24 hour  Intake 180 ml  Output 2500 ml  Net -2320 ml   Filed Weights   10/06/18 0512 10/07/18 0520 10/08/18 0444  Weight: (!) 141.7 kg (!) 141.7 kg (!) 141.3 kg  Examination:  General exam: alert and comfortable.  Respiratory system: good air entry , no wheezing or rhonchi.  Cardiovascular system: s1s2, RRR, no JVD.  Gastrointestinal system: abd is soft NT ND BS+ Central nervous system: ALERT and oriented, non focal.  Extremities: no cyanosis or clubbing.  Skin: On her abdomen patient has about 9 into 7 cm skin ulcer, multiple small ulcers under the right breast, the left torso fold patient has 2 small skin ulcers., no changes in  the ulcer. No change in ulcers. Draining has improved.  Psychiatry:  Mood appropriate.     Data Reviewed: I have personally reviewed following labs and imaging studies  CBC: Recent Labs  Lab 10/04/18 1655 10/04/18 1809 10/05/18 0132 10/07/18 1113  WBC  --  7.8  --  7.3  NEUTROABS  --  5.0  --   --   HGB 11.6* 10.0* 10.9* 9.1*  HCT 34.0* 34.1* 32.0* 30.4*  MCV  --  85.0  --  82.6  PLT  --  474*  --  213*   Basic Metabolic Panel: Recent Labs  Lab 10/04/18 1651  10/06/18 0410 10/06/18 1837 10/07/18 0530 10/07/18 1942 10/08/18 0353  NA 141   < > 137 139 139 138 138  K 5.7*   < > 5.7* 6.1* 5.7* 5.4* 4.8  CL 109   < > 110 109 111 109 107  CO2 21*   < > 22 18* 18* 23 21*  GLUCOSE 222*   < > 100* 104* 113* 133* 176*  BUN 30*   < > 32* 34* 37* 37* 39*  CREATININE 1.76*   < > 1.67* 1.75* 1.64* 1.88* 1.77*  CALCIUM 9.2   < > 8.9 9.3 8.8* 9.1 8.8*  MG 1.8  --   --   --   --   --   --    < > = values in this interval not displayed.   GFR: Estimated Creatinine Clearance: 43.3 mL/min (A) (by C-G formula based on SCr of 1.77 mg/dL (H)). Liver Function Tests: Recent Labs  Lab 10/04/18 1651  AST 13*  ALT 16  ALKPHOS 112  BILITOT 0.3  PROT 7.7  ALBUMIN 3.6   Recent Labs  Lab 10/04/18 1651  LIPASE 63*   No results for input(s): AMMONIA in the last 168 hours. Coagulation Profile: No results for input(s): INR, PROTIME in the last 168 hours. Cardiac Enzymes: Recent Labs  Lab 10/04/18 1651  CKTOTAL 36*   BNP (last 3 results) No results for input(s): PROBNP in the last 8760 hours. HbA1C: No results for input(s): HGBA1C in the last 72 hours. CBG: Recent Labs  Lab 10/07/18 1619 10/07/18 2051 10/08/18 0741 10/08/18 1150 10/08/18 1628  GLUCAP 165* 125* 105* 157* 150*   Lipid Profile: No results for input(s): CHOL, HDL, LDLCALC, TRIG, CHOLHDL, LDLDIRECT in the last 72 hours. Thyroid Function Tests: No results for input(s): TSH, T4TOTAL, FREET4, T3FREE, THYROIDAB  in the last 72 hours. Anemia Panel: No results for input(s): VITAMINB12, FOLATE, FERRITIN, TIBC, IRON, RETICCTPCT in the last 72 hours. Sepsis Labs: Recent Labs  Lab 10/04/18 1809  LATICACIDVEN 1.3    Recent Results (from the past 240 hour(s))  SARS Coronavirus 2 [facility] order, Performed in [facility] hospital lab)     Status: None   Collection Time: 10/05/18  1:05 AM  Result Value Ref Range Status   SARS Coronavirus 2 NEGATIVE NEGATIVE Final    Comment: (NOTE) If result is NEGATIVE SARS-CoV-2 target nucleic acids  are NOT DETECTED. The SARS-CoV-2 RNA is generally detectable in upper and lower  respiratory specimens during the acute phase of infection. The lowest  concentration of SARS-CoV-2 viral copies this assay can detect is 250  copies / mL. A negative result does not preclude SARS-CoV-2 infection  and should not be used as the sole basis for treatment or other  patient management decisions.  A negative result may occur with  improper specimen collection / handling, submission of specimen other  than nasopharyngeal swab, presence of viral mutation(s) within the  areas targeted by this assay, and inadequate number of viral copies  (<250 copies / mL). A negative result must be combined with clinical  observations, patient history, and epidemiological information. If result is POSITIVE SARS-CoV-2 target nucleic acids are DETECTED. The SARS-CoV-2 RNA is generally detectable in upper and lower  respiratory specimens dur ing the acute phase of infection.  Positive  results are indicative of active infection with SARS-CoV-2.  Clinical  correlation with patient history and other diagnostic information is  necessary to determine patient infection status.  Positive results do  not rule out bacterial infection or co-infection with other viruses. If result is PRESUMPTIVE POSTIVE SARS-CoV-2 nucleic acids MAY BE PRESENT.   A presumptive positive result was obtained on the submitted  specimen  and confirmed on repeat testing.  While 2019 novel coronavirus  (SARS-CoV-2) nucleic acids may be present in the submitted sample  additional confirmatory testing may be necessary for epidemiological  and / or clinical management purposes  to differentiate between  SARS-CoV-2 and other Sarbecovirus currently known to infect humans.  If clinically indicated additional testing with an alternate test  methodology [phone]) is advised. The SARS-CoV-2 RNA is generally  detectable in upper and lower respiratory sp ecimens during the acute  phase of infection. The expected result is Negative. Fact Sheet for Patients:  StrictlyIdeas.no Fact Sheet for Healthcare Providers: BankingDealers.co.za This test is not yet approved or cleared by the [place] FDA and has been authorized for detection and/or diagnosis of SARS-CoV-2 by FDA under an Emergency Use Authorization (EUA).  This EUA will remain in effect (meaning this test can be used) for the duration of the COVID-19 declaration under Section 564(b)(1) of the Act, 21 U.S.C. section 360bbb-3(b)(1), unless the authorization is terminated or revoked sooner. Performed at [facility], [place] [address] [place], [place] [address]   Urine culture     Status: Abnormal   Collection Time: 10/05/18  1:13 AM   Specimen: Urine, Random  Result Value Ref Range Status   Specimen Description   Final    URINE, RANDOM Performed at [place] [address] [place], [place] [address]    Special Requests   Final    NONE Performed at [facility], [place] [address] [place], [place] [address]    Culture (A)  Final    <10,000 COLONIES/mL INSIGNIFICANT GROWTH Performed at [place] [address] [place], [place] [address]    Report Status 10/06/2018 FINAL  Final  Blood culture (routine x 2)     Status: None (Preliminary result)    Collection Time: 10/05/18  1:21 AM   Specimen: BLOOD  Result Value Ref Range Status   Specimen Description   Final    BLOOD RIGHT ANTECUBITAL Performed at [facility] [address], [place], [place] [address]    Special Requests   Final    BOTTLES DRAWN AEROBIC AND  ANAEROBIC Blood Culture adequate volume Performed at [facility] [address] [place], [place] [address]    Culture   Final    NO GROWTH 3 DAYS Performed at [facility], [place] [address] [place], [place] [address]    Report Status PENDING  Incomplete         Radiology Studies: No results found.      Scheduled Meds: . enoxaparin (LOVENOX) injection  40 mg Subcutaneous Daily  . gabapentin  600 mg Oral TID  . hydrOXYzine  50 mg Oral TID  . insulin aspart  0-9 Units Subcutaneous TID WC  . terbinafine  250 mg Oral Daily   Continuous Infusions: . ceFEPime (MAXIPIME) IV 2 g (10/08/18 1223)  . vancomycin Stopped (10/07/18 1153)     LOS: 2 days    Time spent: 29 minutes    Hosie Poisson, MD [facility] Pager [phone]  If 7PM-7AM, please contact night-coverage [email or web address] Password [facility] 10/08/2018, 6:32 PM

## 2018-10-09 LAB — BASIC METABOLIC PANEL
Anion gap: 7 (ref 5–15)
BUN: 43 mg/dL — ABNORMAL HIGH (ref 8–23)
CO2: 22 mmol/L (ref 22–32)
Calcium: 8.9 mg/dL (ref 8.9–10.3)
Chloride: 108 mmol/L (ref 98–111)
Creatinine, Ser: 1.59 mg/dL — ABNORMAL HIGH (ref 0.44–1.00)
GFR calc Af Amer: 39 mL/min — ABNORMAL LOW (ref >60)
GFR calc non Af Amer: 34 mL/min — ABNORMAL LOW (ref >60)
Glucose, Bld: 177 mg/dL — ABNORMAL HIGH (ref 70–99)
Potassium: 5 mmol/L (ref 3.5–5.1)
Sodium: 137 mmol/L (ref 135–145)

## 2018-10-09 LAB — GLUCOSE, CAPILLARY
Glucose-Capillary: 110 mg/dL — ABNORMAL HIGH (ref 70–99)
Glucose-Capillary: 181 mg/dL — ABNORMAL HIGH (ref 70–99)

## 2018-10-09 MED ORDER — GLIPIZIDE 5 MG PO TABS: 5.0000 mg | ORAL_TABLET | Freq: Every day | ORAL | 0 refills | Status: DC

## 2018-10-09 MED ORDER — DIPHENHYDRAMINE HCL 50 MG/ML IJ SOLN
25.0000 mg | Freq: Once | INTRAMUSCULAR | Status: AC
  Administered 2018-10-09: 25 mg via INTRAVENOUS
  Filled 2018-10-09: qty 1

## 2018-10-09 NOTE — TOC Progression Note (Signed)
Transition of Care [facility]) - Progression Note    Patient Details  Name: [name] [name] MRN: [id number] Date of Birth: [birth year]  Transition [facility] Care [facility]) CM/SW Contact  [name], RN Phone Number: 10/09/2018, 12:51 PM  Clinical Narrative:    A call was made to [facility] to inform that pt had been admitted. Also check to see what [facility] agency could service pt. With the [facility] pt's card reads [name] service member ID [id number] ex 02/02/2026. 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 A call was made to [name], [place] at the [facility] for assistance for [name]. [phone] ext [phone] who called back and recommended [facility]. [facility] in house rep was called Malachy Mood was called.  Explained that pt use [facility]. [facility] HHRN[facility]PT and supply a HHNA for pt.          Expected Discharge Plan and Services                                                 Social Determinants of Health (SDOH) Interventions    Readmission Risk Interventions No flowsheet data found.

## 2018-10-09 NOTE — Care Management Important Message (Signed)
Important Message  Patient Details IM Letter given to Cookie McGibboney RN to present to the Patient Name: [name] [name] MRN: [id number] Date of Birth: 05-May-1955   Medicare Important Message Given:  Yes     Kerin Salen 10/09/2018, 10:31 AM

## 2018-10-09 NOTE — Plan of Care (Signed)
Patient wears CPAP at night

## 2018-10-09 NOTE — Discharge Summary (Signed)
Physician Discharge Summary  [name] [name] EHU:[id number] DOB: 11/30/1955 DOA: 10/04/2018  PCP: System, Pcp Not In  Admit date: 10/04/2018 Discharge date: 10/09/2018  Admitted From: HOME  Disposition:HOME   Recommendations for Outpatient Follow-up:  1. Follow up with PCP in 1-2 weeks 2. Please obtain BMP/CBC in one week   Home Health:YES Equipment/Devices: wound care   Discharge Condition:STABLE.  CODE STATUS:FULL CODE Diet recommendation: Heart Healthy   Brief/Interim Summary:  63 year old lady with prior history of type 2 diabetes, hypertension, hyperlipidemia, stage IV CKD, presents with hyperkalemia and worsening drainage from multiple wounds on her abdomen torso and under the right breast.  She was admitted for cellulitis and worsening and drainage from the multiple ulcers and hyperkalemia.   Hyperkalemia resolved with lokelma and IV lasix. She was started on broad spectrum IV antibiotics for the  draining ulcers. PT evaluation recommended SNF, but pt refused, currently waiting for home health to be set up for discharge.   Discharge Diagnoses:  Principal Problem:   Cellulitis Active Problems:   Hyperkalemia   Diabetes type 2, uncontrolled (HCC)   HLD (hyperlipidemia)   CKD (chronic kidney disease) stage 4, GFR 15-29 ml/min (HCC)   Myalgia   Acute cystitis without hematuria   Essential hypertension   Pressure injury of skin  Hyperkalemia Probably secondary to lisinopril and dehydration lokelma ordered along with oen dose of lasix which has normalized potassium. EKG shows normal sinus rhythm. Holding lisinopril on discharge.   Mild cellulitis around the multiple skin ulcers associated with serosanguineous drainage and pain and some induration. The pain and draining improved,  The sero sanguinous drainage appears to have minimized.  Continue with dressing changes as per recommended by wound care. Patient requested not to scratch the ulcers and ordered Atarax  50 mg 3 times daily. Pain control with Percocet and gabapentin. Completed 5 days of antibiotics.    Stage IV CKD Creatinine appears to be at baseline at 1.7    Diabetic neuropathy Continue with gabapentin   Type 2 diabetes mellitus A1c 6.9 Continue with sliding scale insulin.  Maintain strict control of CBGs for better healing of the multiple ulcers    Hypertension Well-controlled  Myalgias CK levels within normal limits. Pain control and recommended for the patient to get out of bed and mobilize. PT evaluation ordered and recommended SNF, but pt refused and wanted to go home.  Currently waiting for [facility] to be set up for discharge.     Discharge Instructions  Discharge Instructions    Diet - low sodium heart healthy   Complete by: As directed    Discharge instructions   Complete by: As directed    Please follow up with wound care as recommended .     Allergies as of 10/09/2018      Reactions   Morphine Rash   Penicillins Itching, Nausea And Vomiting, Rash, Hives   Has patient had a PCN reaction causing immediate rash, facial/tongue/throat swelling, SOB or lightheadedness with hypotension: No Has patient had a PCN reaction causing severe rash involving mucus membranes or skin necrosis: No Has patient had a PCN reaction that required hospitalization: No Has patient had a PCN reaction occurring within the last 10 years: No If all of the above answers are "NO", then may proceed with Cephalosporin use. Has patient had a PCN reaction causing immediate rash, facial/tongue/throat swelling, SOB or lightheadedness with hypotension: No Has patient had a PCN reaction causing severe rash involving mucus membranes or skin necrosis: No Has  patient had a PCN reaction that required hospitalization: No Has patient had a PCN reaction occurring within the last 10 years: No If all of the above answers are "NO", then may proceed with Cephalosporin use.   Morphine And Related  Itching      Medication List    STOP taking these medications   oxyCODONE-acetaminophen 10-325 MG tablet Commonly known as: PERCOCET     TAKE these medications   acetaminophen 325 MG tablet Commonly known as: TYLENOL Take 2 tablets (650 mg total) by mouth every 6 (six) hours as needed for mild pain (or Fever >/= 101). What changed:   medication strength  how much to take  when to take this  reasons to take this   ALPRAZolam 1 MG tablet Commonly known as: XANAX Take 1 mg by mouth 3 (three) times daily as needed for anxiety.   furosemide 20 MG tablet Commonly known as: LASIX Take 40 mg by mouth daily.   gabapentin 600 MG tablet Commonly known as: NEURONTIN Take 600 mg by mouth 3 (three) times daily.   glipiZIDE 5 MG tablet Commonly known as: GLUCOTROL Take 1 tablet (5 mg total) by mouth daily before breakfast.   hydrOXYzine 50 MG capsule Commonly known as: VISTARIL TAKE ONE CAPSULE BY MOUTH EVERY 6 HOURS AS NEEDED What changed:   how much to take  how to take this  when to take this  reasons to take this  additional instructions   hydrOXYzine 50 MG tablet Commonly known as: ATARAX/VISTARIL Take 50 mg by mouth every 8 (eight) hours as needed for anxiety or itching.   oxyCODONE 5 MG immediate release tablet Commonly known as: Oxy IR/ROXICODONE Take 1 tablet (5 mg total) by mouth 2 (two) times daily as needed for severe pain.   terbinafine 250 MG tablet Commonly known as: LAMISIL Take 250 mg by mouth daily.   zolpidem 10 MG tablet Commonly known as: AMBIEN Take 10 mg by mouth at bedtime as needed for sleep.       Allergies  Allergen Reactions  . Morphine Rash  . Penicillins Itching, Nausea And Vomiting, Rash and Hives    Has patient had a PCN reaction causing immediate rash, facial/tongue/throat swelling, SOB or lightheadedness with hypotension: No Has patient had a PCN reaction causing severe rash involving mucus membranes or skin necrosis:  No Has patient had a PCN reaction that required hospitalization: No Has patient had a PCN reaction occurring within the last 10 years: No If all of the above answers are "NO", then may proceed with Cephalosporin use.  Has patient had a PCN reaction causing immediate rash, facial/tongue/throat swelling, SOB or lightheadedness with hypotension: No Has patient had a PCN reaction causing severe rash involving mucus membranes or skin necrosis: No Has patient had a PCN reaction that required hospitalization: No Has patient had a PCN reaction occurring within the last 10 years: No If all of the above answers are "NO", then may proceed with Cephalosporin use.   Marland Kitchen Morphine And Related Itching    Consultations: none  Procedures/Studies: Dg Chest Portable 1 View  Result Date: 10/04/2018 CLINICAL DATA:  Decreased diuretics, increasing shortness of breath EXAM: PORTABLE CHEST 1 VIEW COMPARISON:  Radiograph Jun 29, 2014 FINDINGS: Patient body habitus results in suboptimal penetration of the lower lung bases. Accounting for body habitus, the lungs are clear. No consolidation, features of edema, pneumothorax, or effusion. Prominence of the cardiac silhouette likely related to portable technique. Postsurgical changes from prior right distal  clavicular resection. No acute osseous or soft tissue abnormality. IMPRESSION: Accounting for body habitus and suboptimal projection, the lungs are grossly clear Electronically Signed   By: Lovena Le M.D.   On: 10/04/2018 17:04       Subjective: No new complaints  Discharge Exam: Vitals:   10/09/18 0426 10/09/18 1251  BP: (!) 103/50 (!) 135/49  Pulse: 93 94  Resp:  20  Temp: 98.1 F (36.7 C) 98.1 F (36.7 C)  SpO2: 98% 92%   Vitals:   10/08/18 1402 10/08/18 2216 10/09/18 0426 10/09/18 1251  BP: (!) 107/52 (!) 130/57 (!) 103/50 (!) 135/49  Pulse: 90 92 93 94  Resp: 16 18  20   Temp: 98.6 F (37 C) 98.2 F (36.8 C) 98.1 F (36.7 C) 98.1 F (36.7 C)   TempSrc: Oral Oral Oral Oral  SpO2: 96% 98% 98% 92%  Weight:   (!) 141.9 kg   Height:        General: Pt is alert, awake, not in acute distress Cardiovascular: RRR, S1/S2 +, no rubs, no gallops Respiratory: CTA bilaterally, no wheezing, no rhonchi Abdominal: Soft, NT, ND, bowel sounds + Extremities:trace edema.    The results of significant diagnostics from this hospitalization (including imaging, microbiology, ancillary and laboratory) are listed below for reference.     Microbiology: Recent Results (from the past 240 hour(s))  SARS Coronavirus 2 [facility] order, Performed in [facility] hospital lab)     Status: None   Collection Time: 10/05/18  1:05 AM  Result Value Ref Range Status   SARS Coronavirus 2 NEGATIVE NEGATIVE Final    Comment: (NOTE) If result is NEGATIVE SARS-CoV-2 target nucleic acids are NOT DETECTED. The SARS-CoV-2 RNA is generally detectable in upper and lower  respiratory specimens during the acute phase of infection. The lowest  concentration of SARS-CoV-2 viral copies this assay can detect is 250  copies / mL. A negative result does not preclude SARS-CoV-2 infection  and should not be used as the sole basis for treatment or other  patient management decisions.  A negative result may occur with  improper specimen collection / handling, submission of specimen other  than nasopharyngeal swab, presence of viral mutation(s) within the  areas targeted by this assay, and inadequate number of viral copies  (<250 copies / mL). A negative result must be combined with clinical  observations, patient history, and epidemiological information. If result is POSITIVE SARS-CoV-2 target nucleic acids are DETECTED. The SARS-CoV-2 RNA is generally detectable in upper and lower  respiratory specimens dur ing the acute phase of infection.  Positive  results are indicative of active infection with SARS-CoV-2.  Clinical  correlation with patient history and other  diagnostic information is  necessary to determine patient infection status.  Positive results do  not rule out bacterial infection or co-infection with other viruses. If result is PRESUMPTIVE POSTIVE SARS-CoV-2 nucleic acids MAY BE PRESENT.   A presumptive positive result was obtained on the submitted specimen  and confirmed on repeat testing.  While 2019 novel coronavirus  (SARS-CoV-2) nucleic acids may be present in the submitted sample  additional confirmatory testing may be necessary for epidemiological  and / or clinical management purposes  to differentiate between  SARS-CoV-2 and other Sarbecovirus currently known to infect humans.  If clinically indicated additional testing with an alternate test  methodology [phone]) is advised. The SARS-CoV-2 RNA is generally  detectable in upper and lower respiratory sp ecimens during the acute  phase of infection. The  expected result is Negative. Fact Sheet for Patients:  StrictlyIdeas.no Fact Sheet for Healthcare Providers: BankingDealers.co.za This test is not yet approved or cleared by the [place] FDA and has been authorized for detection and/or diagnosis of SARS-CoV-2 by FDA under an Emergency Use Authorization (EUA).  This EUA will remain in effect (meaning this test can be used) for the duration of the COVID-19 declaration under Section 564(b)(1) of the Act, 21 U.S.C. section 360bbb-3(b)(1), unless the authorization is terminated or revoked sooner. Performed at [facility], [place] [address], [place], [place] [address]   Urine culture     Status: Abnormal   Collection Time: 10/05/18  1:13 AM   Specimen: Urine, Random  Result Value Ref Range Status   Specimen Description   Final    URINE, RANDOM Performed at [facility] [address], [place], [place] [address]    Special Requests   Final    NONE Performed at [facility], [place] [address], [place], [place] [address]    Culture (A)  Final    <10,000 COLONIES/mL INSIGNIFICANT GROWTH Performed at [facility] [address], [place], [place] [address]    Report Status 10/06/2018 FINAL  Final  Blood culture (routine x 2)     Status: None (Preliminary result)   Collection Time: 10/05/18  1:21 AM   Specimen: BLOOD  Result Value Ref Range Status   Specimen Description   Final    BLOOD RIGHT ANTECUBITAL Performed at [facility] [address] [place], [place] [address]    Special Requests   Final    BOTTLES DRAWN AEROBIC AND ANAEROBIC Blood Culture adequate volume Performed at [facility] [address] [place], [place] [address]    Culture   Final    NO GROWTH 4 DAYS Performed at [facility], [place] [address], [place],  [address]    Report Status PENDING  Incomplete     Labs: BNP (last 3 results) Recent Labs    10/04/18 1809  BNP 54.5   Basic Metabolic Panel: Recent Labs  Lab 10/04/18 1651  10/06/18 1837 10/07/18 0530 10/07/18 1942 10/08/18 0353 10/09/18 0409  NA 141   < > 139 139 138 138 137  K 5.7*   < > 6.1* 5.7* 5.4* 4.8 5.0  CL 109   < > 109 111 109 107 108  CO2 21*   < > 18* 18* 23 21* 22  GLUCOSE 222*   < > 104* 113* 133* 176* 177*  BUN 30*   < > 34* 37* 37* 39* 43*  CREATININE 1.76*   < > 1.75* 1.64* 1.88* 1.77* 1.59*  CALCIUM 9.2   < > 9.3 8.8* 9.1 8.8* 8.9  MG 1.8  --   --   --   --   --   --    < > = values in this interval not displayed.   Liver Function Tests: Recent Labs  Lab 10/04/18 1651  AST 13*  ALT 16  ALKPHOS 112  BILITOT 0.3  PROT 7.7  ALBUMIN 3.6   Recent Labs  Lab 10/04/18 1651  LIPASE 63*   No results for input(s): AMMONIA in the last 168 hours. CBC: Recent Labs  Lab 10/04/18 1655 10/04/18 1809 10/05/18 0132 10/07/18 1113  WBC  --  7.8  --  7.3  NEUTROABS  --  5.0  --   --   HGB 11.6* 10.0*  10.9* 9.1*  HCT 34.0* 34.1*  32.0* 30.4*  MCV  --  85.0  --  82.6  PLT  --  474*  --  465*   Cardiac Enzymes: Recent Labs  Lab 10/04/18 1651  CKTOTAL 36*   BNP: Invalid input(s): POCBNP CBG: Recent Labs  Lab 10/08/18 1150 10/08/18 1628 10/08/18 2311 10/09/18 0753 10/09/18 1107  GLUCAP 157* 150* 171* 110* 181*   D-Dimer No results for input(s): DDIMER in the last 72 hours. Hgb A1c No results for input(s): HGBA1C in the last 72 hours. Lipid Profile No results for input(s): CHOL, HDL, LDLCALC, TRIG, CHOLHDL, LDLDIRECT in the last 72 hours. Thyroid function studies No results for input(s): TSH, T4TOTAL, T3FREE, THYROIDAB in the last 72 hours.  Invalid input(s): FREET3 Anemia work up No results for input(s): VITAMINB12, FOLATE, FERRITIN, TIBC, IRON, RETICCTPCT in the last 72 hours. Urinalysis    Component Value Date/Time   COLORURINE YELLOW 10/04/2018 2350   APPEARANCEUR CLEAR 10/04/2018 2350   LABSPEC 1.016 10/04/2018 2350   PHURINE 5.0 10/04/2018 2350   GLUCOSEU NEGATIVE 10/04/2018 2350   HGBUR NEGATIVE 10/04/2018 2350   BILIRUBINUR NEGATIVE 10/04/2018 2350   KETONESUR NEGATIVE 10/04/2018 2350   PROTEINUR NEGATIVE 10/04/2018 2350   UROBILINOGEN 0.2 06/28/2014 1500   NITRITE NEGATIVE 10/04/2018 2350   LEUKOCYTESUR LARGE (A) 10/04/2018 2350   Sepsis Labs Invalid input(s): PROCALCITONIN,  WBC,  LACTICIDVEN Microbiology Recent Results (from the past 240 hour(s))  SARS Coronavirus 2 [facility] order, Performed in [place] hospital lab)     Status: None   Collection Time: 10/05/18  1:05 AM  Result Value Ref Range Status   SARS Coronavirus 2 NEGATIVE NEGATIVE Final    Comment: (NOTE) If result is NEGATIVE SARS-CoV-2 target nucleic acids are NOT DETECTED. The SARS-CoV-2 RNA is generally detectable in upper and lower  respiratory specimens during the acute phase of infection. The lowest  concentration of SARS-CoV-2 viral copies this assay can detect is 250  copies / mL. A negative result  does not preclude SARS-CoV-2 infection  and should not be used as the sole basis for treatment or other  patient management decisions.  A negative result may occur with  improper specimen collection / handling, submission of specimen other  than nasopharyngeal swab, presence of viral mutation(s) within the  areas targeted by this assay, and inadequate number of viral copies  (<250 copies / mL). A negative result must be combined with clinical  observations, patient history, and epidemiological information. If result is POSITIVE SARS-CoV-2 target nucleic acids are DETECTED. The SARS-CoV-2 RNA is generally detectable in upper and lower  respiratory specimens dur ing the acute phase of infection.  Positive  results are indicative of active infection with SARS-CoV-2.  Clinical  correlation with patient history and other diagnostic information is  necessary to determine patient infection status.  Positive results do  not rule out bacterial infection or co-infection with other viruses. If result is PRESUMPTIVE POSTIVE SARS-CoV-2 nucleic acids MAY BE PRESENT.   A presumptive positive result was obtained on the submitted specimen  and confirmed on repeat testing.  While 2019 novel coronavirus  (SARS-CoV-2) nucleic acids may be present in the submitted sample  additional confirmatory testing may be necessary for epidemiological  and / or clinical management purposes  to differentiate between  SARS-CoV-2 and other Sarbecovirus currently known to infect humans.  If clinically indicated additional testing with an alternate test  methodology [phone]) is advised. The SARS-CoV-2 RNA is generally  detectable  in upper and lower respiratory sp ecimens during the acute  phase of infection. The expected result is Negative. Fact Sheet for Patients:  StrictlyIdeas.no Fact Sheet for Healthcare Providers: BankingDealers.co.za This test is not yet approved or  cleared by the [place] FDA and has been authorized for detection and/or diagnosis of SARS-CoV-2 by FDA under an Emergency Use Authorization (EUA).  This EUA will remain in effect (meaning this test can be used) for the duration of the COVID-19 declaration under Section 564(b)(1) of the Act, 21 U.S.C. section 360bbb-3(b)(1), unless the authorization is terminated or revoked sooner. Performed at [facility], [place] [address] [place], [place] [address]   Urine culture     Status: Abnormal   Collection Time: 10/05/18  1:13 AM   Specimen: Urine, Random  Result Value Ref Range Status   Specimen Description   Final    URINE, RANDOM Performed at [facility] [address], [place], [place] [address]    Special Requests   Final    NONE Performed at [facility], [place] [address] [place], [place] [address]    Culture (A)  Final    <10,000 COLONIES/mL INSIGNIFICANT GROWTH Performed at [facility] [address], [place], [place] [address]    Report Status 10/06/2018 FINAL  Final  Blood culture (routine x 2)     Status: None (Preliminary result)   Collection Time: 10/05/18  1:21 AM   Specimen: BLOOD  Result Value Ref Range Status   Specimen Description   Final    BLOOD RIGHT ANTECUBITAL Performed at [facility] [address], [place], [place] [address]    Special Requests   Final    BOTTLES DRAWN AEROBIC AND ANAEROBIC Blood Culture adequate volume Performed at [facility] [address], [place], [place] [address]    Culture   Final    NO GROWTH 4 DAYS Performed at [facility], [place] [address] [place], [place] [address]    Report Status PENDING  Incomplete     Time coordinating discharge:32 minutes  SIGNED:   Hosie Poisson, MD  [facility] 10/09/2018, 2:00 PM Pager   If 7PM-7AM, please contact night-coverage [email or web address] Password [email or web address]

## 2018-10-10 LAB — CULTURE, BLOOD (ROUTINE X 2)
Culture: NO GROWTH
Special Requests: ADEQUATE

## 2018-10-11 DIAGNOSIS — I129 Hypertensive chronic kidney disease with stage 1 through stage 4 chronic kidney disease, or unspecified chronic kidney disease: Secondary | ICD-10-CM | POA: Diagnosis not present

## 2018-10-11 DIAGNOSIS — Z87891 Personal history of nicotine dependence: Secondary | ICD-10-CM | POA: Diagnosis not present

## 2018-10-11 DIAGNOSIS — L281 Prurigo nodularis: Secondary | ICD-10-CM | POA: Diagnosis not present

## 2018-10-11 DIAGNOSIS — M069 Rheumatoid arthritis, unspecified: Secondary | ICD-10-CM | POA: Diagnosis not present

## 2018-10-11 DIAGNOSIS — T8189XA Other complications of procedures, not elsewhere classified, initial encounter: Secondary | ICD-10-CM | POA: Diagnosis present

## 2018-10-11 DIAGNOSIS — L98492 Non-pressure chronic ulcer of skin of other sites with fat layer exposed: Secondary | ICD-10-CM | POA: Diagnosis not present

## 2018-10-11 DIAGNOSIS — Y838 Other surgical procedures as the cause of abnormal reaction of the patient, or of later complication, without mention of misadventure at the time of the procedure: Secondary | ICD-10-CM | POA: Diagnosis not present

## 2018-10-11 DIAGNOSIS — I890 Lymphedema, not elsewhere classified: Secondary | ICD-10-CM | POA: Diagnosis not present

## 2018-10-11 DIAGNOSIS — N184 Chronic kidney disease, stage 4 (severe): Secondary | ICD-10-CM | POA: Diagnosis not present

## 2018-10-11 DIAGNOSIS — E1122 Type 2 diabetes mellitus with diabetic chronic kidney disease: Secondary | ICD-10-CM | POA: Diagnosis not present

## 2018-10-11 DIAGNOSIS — E1140 Type 2 diabetes mellitus with diabetic neuropathy, unspecified: Secondary | ICD-10-CM | POA: Diagnosis not present

## 2018-10-11 DIAGNOSIS — B354 Tinea corporis: Secondary | ICD-10-CM | POA: Diagnosis not present

## 2018-10-11 DIAGNOSIS — G4730 Sleep apnea, unspecified: Secondary | ICD-10-CM | POA: Diagnosis not present

## 2018-10-25 ENCOUNTER — Encounter (HOSPITAL_BASED_OUTPATIENT_CLINIC_OR_DEPARTMENT_OTHER): Payer: Medicare Other | Attending: Internal Medicine

## 2018-10-25 DIAGNOSIS — I120 Hypertensive chronic kidney disease with stage 5 chronic kidney disease or end stage renal disease: Secondary | ICD-10-CM | POA: Insufficient documentation

## 2018-10-25 DIAGNOSIS — T8189XA Other complications of procedures, not elsewhere classified, initial encounter: Secondary | ICD-10-CM | POA: Diagnosis present

## 2018-10-25 DIAGNOSIS — E1140 Type 2 diabetes mellitus with diabetic neuropathy, unspecified: Secondary | ICD-10-CM | POA: Diagnosis not present

## 2018-10-25 DIAGNOSIS — L98492 Non-pressure chronic ulcer of skin of other sites with fat layer exposed: Secondary | ICD-10-CM | POA: Diagnosis not present

## 2018-10-25 DIAGNOSIS — N186 End stage renal disease: Secondary | ICD-10-CM | POA: Insufficient documentation

## 2018-10-25 DIAGNOSIS — G4730 Sleep apnea, unspecified: Secondary | ICD-10-CM | POA: Diagnosis not present

## 2018-10-25 DIAGNOSIS — Y838 Other surgical procedures as the cause of abnormal reaction of the patient, or of later complication, without mention of misadventure at the time of the procedure: Secondary | ICD-10-CM | POA: Insufficient documentation

## 2018-10-25 DIAGNOSIS — L304 Erythema intertrigo: Secondary | ICD-10-CM | POA: Diagnosis not present

## 2018-10-25 DIAGNOSIS — E1122 Type 2 diabetes mellitus with diabetic chronic kidney disease: Secondary | ICD-10-CM | POA: Insufficient documentation

## 2018-10-26 ENCOUNTER — Other Ambulatory Visit: Payer: Self-pay

## 2018-11-08 DIAGNOSIS — T8189XA Other complications of procedures, not elsewhere classified, initial encounter: Secondary | ICD-10-CM | POA: Diagnosis not present

## 2018-11-22 ENCOUNTER — Encounter (HOSPITAL_BASED_OUTPATIENT_CLINIC_OR_DEPARTMENT_OTHER): Payer: Medicare Other | Attending: Internal Medicine | Admitting: Internal Medicine

## 2018-11-22 ENCOUNTER — Other Ambulatory Visit: Payer: Self-pay

## 2018-11-22 DIAGNOSIS — T8131XA Disruption of external operation (surgical) wound, not elsewhere classified, initial encounter: Secondary | ICD-10-CM | POA: Diagnosis present

## 2018-11-22 DIAGNOSIS — B354 Tinea corporis: Secondary | ICD-10-CM | POA: Insufficient documentation

## 2018-11-22 DIAGNOSIS — I120 Hypertensive chronic kidney disease with stage 5 chronic kidney disease or end stage renal disease: Secondary | ICD-10-CM | POA: Insufficient documentation

## 2018-11-22 DIAGNOSIS — M069 Rheumatoid arthritis, unspecified: Secondary | ICD-10-CM | POA: Insufficient documentation

## 2018-11-22 DIAGNOSIS — G4730 Sleep apnea, unspecified: Secondary | ICD-10-CM | POA: Insufficient documentation

## 2018-11-22 DIAGNOSIS — I890 Lymphedema, not elsewhere classified: Secondary | ICD-10-CM | POA: Diagnosis not present

## 2018-11-22 DIAGNOSIS — L98492 Non-pressure chronic ulcer of skin of other sites with fat layer exposed: Secondary | ICD-10-CM | POA: Diagnosis not present

## 2018-11-22 DIAGNOSIS — E1122 Type 2 diabetes mellitus with diabetic chronic kidney disease: Secondary | ICD-10-CM | POA: Insufficient documentation

## 2018-11-22 DIAGNOSIS — Y838 Other surgical procedures as the cause of abnormal reaction of the patient, or of later complication, without mention of misadventure at the time of the procedure: Secondary | ICD-10-CM | POA: Diagnosis not present

## 2018-11-22 DIAGNOSIS — N186 End stage renal disease: Secondary | ICD-10-CM | POA: Insufficient documentation

## 2018-11-22 DIAGNOSIS — E1140 Type 2 diabetes mellitus with diabetic neuropathy, unspecified: Secondary | ICD-10-CM | POA: Diagnosis not present

## 2018-11-22 NOTE — Progress Notes (Signed)
[name], [name] ([id number]) Visit Report for 11/22/2018 HPI Details Patient Name: Date of Service: [name] [name], [name] [name] 11/22/2018 10:30 AM Medical Record Number:[id number] Patient Account Number: [id number] Date of Birth/Sex: Treating RN: 08-07-1955 (63 y.o. [name] Primary Care Provider: SYSTEM, PCP Other Clinician: Referring Provider: Treating Provider/Extender:Robson, Dulcy Fanny, Fuller Plan in Treatment: 10 History of Present Illness HPI Description: 06/29/17; this is a 63 year old woman who is a poor historian. It appears that she's had limited medical care over the last 2 years or so and what she had his through the [place]. She is here for review of a draining area on her right shoulder anteriorly as well as a nonhealing surgical wound in the left lower abdomen. Per her description she had a hysterectomy in 2006 4 "tumors". About 9 months later she had have another operation in the same area because of "tumors" involving her fallopian tubes and ovary. Sounds as though the surgical wound in this area that took a long time to heal. Apparently the area on her left lower abdomen is been open for the last 6 or 7 months. She is been applying topical antibiotics. 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 She had a fall in late 2018 and the open area on top of her shoulder. She is had draining pus coming out of this wound since she's been applying topical antibiotics and a Band-Aid. She is not complaining of a lot of pain or fever although she did have chills today. She saw her doctor at the [place] yesterday she doesn't know her name she apparently was referred to a surgeon at the [facility] [place] presumably an orthopedic surgeon to look at that shoulder. She also has a primary doctor in [place] by the name of Dr. Jimmye Norman who referred her here after seeing her within the last month. 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 We don't have a lot of information in her in Epic. Last hospitalization was from 11/21/16 through 11/24/16 with hypoglycemia felt to be secondary to sulfonylureas. There is no mention of a wound at that time. 07/07/17; patient's shoulder cultured MSSA. I put her on clindamycin to had some anaerobic coverage with caution about diarrhea. I had asked her to make an orthopedic follow-up at the [place] with a doctor that made the original surgery but she has not done that yet. She also has a fairly large wound in the left lower abdomen in the middle of his surgical scar. This was apparently gynecologic surgery perhaps fibroid surgery. In any case these wounds are larger today. 07/21/17; the patient went to see the orthopedic doctor at the [facility] who did her original surgery surgery. Per the patient's description the draining area had closed down. He didn't think anything needed to be done there were no imaging studies. The lower abdominal wound in the left mid pelvis area also looks somewhat better to me. There is less surface material on this still some probing areas but not a lot of depth. We've been using silver alginate 08/03/17; the patient arrives back in 2 week follow-up. The draining sinus on her right shoulder has not reopened although she is complaining of widespread shoulder and other joint pain. She is using silver alginate to the remaining surgical wound on her abdomen. 08/18/17; the patient has 2 week follow-up appointment. The draining sinus on her right shoulder is not reopened.  She is complaining of left shoulder pain today. The presumed surgical wound on her lower abdomen looks quite a bit better and this is really contracted quite nicely. When she first came in the deep sinus she had on the anterior shoulder cultured MSSA. I gave her clindamycin. I arranged for orthopedic follow-up at the [facility] however  this is already closed nevertheless this was a deep probing tunnel and I think it needs to be watched for reopening. 09/07/17; surgical wound on her lower abdomen. This as 2 attached leaflets. The larger one is superiorly. A small probing area here with some sanguinous drainage. Surface needed debridement today. We've been using silver alginate however I changed this to silver collagen today 09/15/17. Surgical wound on her lower abdomen. No recent improvement although this is markedly improved from when we first started seeing her although the original problem was a wound on her right shoulder which is long since closed.switched her to Silver collagen last week She arrives in clinic today complaining of abdominal pain "so bad I almost went to the ER". She tells me she had an endoscopy and colonoscopy about a week ago. This was at the [place], I have no way to look at this. She is unaware of any biopsy was done 10/02/17 on evaluation today patient continues to do well in regard to her abdominal wound were using Prisma it's obviously taken some time for this to heal but nonetheless I do think that she is making good progress currently. There does not appear to be any evidence of infection at this time which is good news. She still does have some tunneling or undermining but again I think this is still doing well. 10/19/17; she continues to make good progress in this surgical abdominal wound using Prisma. This is come in nicely at one point had considerable depth. No evidence of infection. The patient is not complaining of pain in this area but is complaining of diabetic neuropathy pain in her right foot 11/02/17;the patient's wound is superficial and is really continued to make decent progress. I thought this would be more problematic when I first saw however this appears to a filled in nicely there is no depth. It exists in the crevice between what I think is lower abdominal hernias. She comes in  complaining of generalized pain however she is running out of her narcotics providedto her by a local pain clinic. She is asking for home health however she is [place] as I understand we cannot order this. 11/16/2017; 2-week follow-up. She has a lower abdominal surgical wound and this appears to be making nice progress closing in nicely each time we see it. This was initially a deep crevice surgical area between what I think is to lower abdominal hernias. In spite of this wound has healthy granulation. There does not appear to be any depth at all. There is probably a lot of tensile stress on the wound area however in spite of this things actually look better 11/30/2017; 2-week follow-up. She is complaining of left lower quadrant abdominal pain it is difficult to characterize this clearly episodic and not really associated with nausea or vomiting. She follows at the [place]. Tells me she was recently seen there by her new primary physician. The wound is in the lower abdomen in the middle of what I think was a gynecologic surgical site. We have made progress with this. 12/14/2017; 2-week follow-up. The patient comes in today with an improved wound area on the lower mid abdomen surgical  scar which was the wound we have been following her for. Paradoxically she has developed a new open area just to the right of the midline almost a mirror imaging wound on the right. She states this came up about a week ago. She does not have a clear explanation. Both of these wounds are in surgical scar areas although from her I think this surgery was remote. She denies scratching this or traumatizing it. It is surrounded by 2 large areas that I suspect are herniations but I still cannot see how this would have come from trauma. 12/28/2017; the area on the left surgical scar in the lower abdomen is better however the area on the right that we identified last time is worse. Increase in dimensions. There is no obvious infection  here. We have been using silver alginate. She tells me she has been to the ER at the [place] and they gave her doxycycline she also saw her primary doctor yesterday. I think she needs to be referred to dermatology consideration of a skin biopsy question panniculitis 01/11/2018; we continue to have improvement in the left lower mid abdominal wound however the area on the right that we identified in late October has gotten quite a bit worse. There is not appear to be surrounding infection although the patient is complaining of a lot of pain. We have been using silver alginate. [facility] I think this should help 01/24/18 on evaluation today patient actually apparently has two abdominal wounds the one on the right is new here and unfortunately seems to be doing a little bit worse and that it's a little bit larger. Fortunately there does not appear to be any sign of infection at this time. No fevers, chills, nausea, or vomiting noted at this time. Overall again it sounds like this is a fairly complex issue and despite the fact that we been using silver nitrate in the [facility] Dressing still has some hyper granulation. No fevers, chills, nausea, or vomiting noted at this time. 01/31/18 plan evaluation today patient appears to be doing better in general in regard to her wounds. She has been tolerating the dressing changes without complication. I do feel like that right now she's making some progress so she still is having some discomfort. 02/07/18 on evaluation today patient actually appears to be doing rather well in regard to her double ulcers as far as I'm concerned. With that being said she is somewhat frustrated in general in regard to the overall appearance of everything. I feel like this is unwarranted however since she seems to be making progress. 02/15/2018; lower mid abdomen right much greater than left although the left was the original  wound. The area on the left looks really quite good and is smaller. The area on the right seems longer and measures worse in terms of dimensions. These areas are in the most dependent part of her large abdominal pannus and this may be the entire issue. at [facility] on the left there is lymphedema around the wound. The patient is seeing dermatology next week apparently in [facility] 03/01/2017; she likely had abdomen. Right now he other than left ankle both he has better today. I think both of these areas are smaller. Surface looks healthier she is due to see dermatology later this month. Using [facility] 1/23; abdominal wounds in the lower mid abdomen. This is in a very dependent area in her lower abdominal pannus and the simple  tension in this area may have a lot to do with this. She also has constant chronic pruritus and scratching and I wonder if this has something to do with open wounds in this area. She tells me her daughter is helping her with the dressings currently and the original wound on the left side of her mid abdomen is better almost closed the larger area on the right also looks some better. She sees a dermatologist next week I have told her to ask the dermatologist for help with generalized pruritus. The probable skin wounds that she has all over secondary to scratching. The wounds we have been treating may be related to simple tension in a dependent lymphedematous area. She could benefit from an abdominal binder to help support this area 2/13; abdominal wounds in the lower mid abdomen. Very dependent in her abdomen and this may be secondary to tension in the lower part of her abdominal pannus plus or minus edema. 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 She claims not to be scratching at the wound areas. 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 They give her gentian violet to put on the wound area but this is already present and Hydrofera Blue. The original wound on the left is healed today. However the area on the right part of her mid abdominal pannus appears larger. We are going to try to work with her on getting an abdominal binder to help support the weight of the pannus around the wounds 3/9; abdominal wound in the lower mid abdomen. She apparently has an abdominal binder that she bought on [employer]. She is using Hydrofera Blue to the wound on the right of the midline. The original wound was on the left and is closed over. 3/26; abdominal wound in the lower mid abdomen. This is her second wound in this area. The lower part of her abdominal pannus surrounding surgical scar. Once again she is not wearing the abdominal binder she said she obtained. The dressing is Hydrofera Blue 4/16; abdomen in the right lower mid abdomen. This is the second wound in this area the other was a mirror image area on the left. I had her seen by dermatology at the [facility] this was not very helpful. She is not wearing her abdominal binder. We have been using Hydrofera Blue which is what healed at the other side. The abdominal wound is actually larger She arrives in clinic today complaining of the plantar right heel. Very tender. She has been pushing herself up in her bed with her heels and this may be a deep tissue injury. 4/23; abdomen wound is about the same. Spreading along the folds of her pannus somewhat but generally the surface looks somewhat better. She is not using her abdominal binder although she did bring it in this time She arrives in clinic complaining of pain and itching in the right lateral rib area. She has 3 open areas here which are circular wounds. These are new She also mentions her lower sacrum and there are 2 wounds in this area. These are new 5/7;  abdominal wound is about the same although in general it looks better than a few weeks ago at which time it had punched out depth. No debridement is required. She will not wear her abdominal binder  She has areas on her right lateral rib cage 3 open areas circular wounds these look about the same as last time. I am assuming these have something to do with the prurigo nodularis that she may have scratched but I am not certain. Finally she has a linear area on her lower sacrum which I am assuming is a pressure area 5/21-Patient returns at 2 weeks for abdominal wound, areas on the right lateral rib cage, and lower sacrum linear area which is a pressure sore. We have been using silver alginate to all these wounds. She has been asked to wear abdominal binder which she is not. she has 2 new wounds on right flank area and on her coccyx. she has a total of 6 wounds 6/9; I have been following this patient for a prolonged period of time for wounds on her lower mid abdomen which I think were secondary to severe lymphedema. She had an area on the left of the midline that eventually healed only to open on the right side. This is still open we have been using silver alginate. She will not use an abdominal binder. About a month ago she arrived here with open areas on the right lateral ribs. This is in the middle of a large amount of soft tissue/pannus fold. She has 4 open areas here at this time. Finally she has 2 open areas in her lower coccyx. We have been using silver alginate on all the wound areas 6/25; we are following this woman for wounds on her lower mid abdomen in the setting of a pannus and severe dependent lymphedema. More recently she has had areas in the folds on the right lateral rib area. Culture I did of this area last time was negative. Finally she has an area on the coccyx. We have been using silver alginate to all the wound areas 7/9; this is a patient with morbid obesity severe lymphedema in  her lower abdominal pannus/severe dependent abdominal lymphedema. She first came to this clinic with an area on the left lower central abdomen. This eventually healed over. About the same time as it did heal over she developed an area in close juxtaposition on the other side of the midline. We have been following this for some months when she was here last time I thought things were improving although she a lot arrives today with a larger wound. She will not use her external abdominal binder. Over the last several weeks she has had areas on her right lateral rib area. The exact etiology of this is unclear we have been using silver alginate. She arrives today with the entire area inflamed angry with loss of surface epithelium and marked pruritus. I think this is tinea/Candida. This could be secondary or the primary in the etiology of these expanding areas. 7/23; the area on the right lateral rib areas is quite a bit better. Skin that we treated with oral and topical antifungals looks a lot better around this area. Unfortunately the lower abdominal area actually measures larger. There is no drainage here really no surrounding erythema. 8/6-Patient arrives to the clinic at 3 weeks, the right lateral rib cage area has copious drainage but the wounds look a little better, the abdominal midline wound unfortunately is larger, she is not able to use the binder both wounds have a clean base 8/20. Since the patient was last here she was hospitalized from 813 through 818. This was predominantly because of hyperkalemia secondary to lisinopril and dehydration. Lisinopril was  discontinued at discharge. She was also felt to have mild cellulitis around several skin ulcers she was given antibiotics in the hospital but not discharged on antibiotics. Also noted that she has stage IV chronic kidney disease with a baseline creatinine at 1.7. 9/3 the patient still has a 2 wound areas lower mid abdominal pannus and the  right lateral ribs. We have been using polymen to the abdominal wound and silver alginate to the area on the folds on her right lateral ribs. She is complaining about pruritus around the areas on the ribs. 9/17; 2-week follow-up. The area on the mid lower abdomen better using PolyMem. She has 4 small areas in the pannus fold on the right lateral ribs and is a new area on the left lateral lower abdominal folds this week. 10/1; 2-week follow-up. She has been using PolyMem on the mid lower abdomen and really not much improvement. She has this for small areas in the pannus fold of the right lateral ribs. These are gradually I think improving. The area that was new last time on the left lower abdominal pannus has closed over. Electronic Signature(s) Signed: 11/22/2018 6:13:48 PM By: Linton Ham MD Entered By: Linton Ham on 11/22/2018 13:19:29 -------------------------------------------------------------------------------- Physical Exam Details Patient Name: Date of Service: [name] [name], [name] [name] 11/22/2018 10:30 AM Medical Record [id number] Patient Account Number: [id number] Date of Birth/Sex: Treating RN: [birth year] (63 y.o. [name] Primary Care Provider: SYSTEM, PCP Other Clinician: Referring Provider: Treating Provider/Extender:Robson, Dulcy Fanny, Fuller Plan in Treatment: 73 Constitutional Sitting or standing Blood Pressure is within target range for patient.. Pulse regular and within target range for patient.Marland Kitchen Respirations regular, non-labored and within target range.. Temperature is normal and within the target range for the patient.Marland Kitchen Appears in no distress. Eyes Conjunctivae clear. No discharge.no icterus. Respiratory work of breathing is normal. Gastrointestinal (GI) Morbidly obese no tenderness.. Integumentary (Hair, Skin) Multiple cutaneous healed areas from prurigo nodularis. Psychiatric appears at normal baseline. Notes Wound exam Midline  abdomen about the same. In fact I think there is less of the midline epithelialization than she had. Surrounding granulation still looks healthy. The area on the right lateral ribs for open areas. I think these actually look healthier. Skin around them looks less irritated Electronic Signature(s) Signed: 11/22/2018 6:13:48 PM By: Linton Ham MD Entered By: Linton Ham on 11/22/2018 13:21:04 -------------------------------------------------------------------------------- Physician Orders Details Patient Name: Date of Service: [name] Dakin. 11/22/2018 10:30 AM Medical Record Number:[id number] Patient Account Number: [id number] Date of Birth/Sex: Treating RN: September 01, 1955 (63 y.o. Helene Shoe, Tammi Klippel Primary Care Provider: SYSTEM, PCP Other Clinician: Referring Provider: Treating Provider/Extender:Robson, Dulcy Fanny, Fuller Plan in Treatment: 33 Verbal / Phone Orders: No Diagnosis Coding ICD-10 Coding Code Description Disruption of external operation (surgical) wound, not elsewhere classified, subsequent T81.31XD encounter Unspecified open wound of abdominal wall, left lower quadrant without penetration into peritoneal S31.104D cavity, subsequent encounter L98.498 Non-pressure chronic ulcer of skin of other sites with other specified severity I89.0 Lymphedema, not elsewhere classified B35.4 Tinea corporis Follow-up Appointments Return Appointment in 2 weeks. Dressing Change Frequency Wound #3 Right Abdomen - Lower Quadrant Other: - change dressing twice a week. Wound #4 Right Flank Other: - change dressing twice a week. Skin Barriers/Peri-Wound Care Wound #3 Right Abdomen - Lower Quadrant Skin Prep Wound #4 Right Flank Skin Prep Antifungal cream - apply antifungal cream with dressing changes. Wound Cleansing May shower and wash wound with soap and water. Primary Wound Dressing Wound #3 Right Abdomen - Lower Quadrant Collagen - moisten  with hydrogel Wound #4  Right Flank Calcium Alginate with Silver Secondary Dressing Wound #3 Right Abdomen - Lower Quadrant Dry Gauze ABD pad - secure with tape Wound #4 Right Flank ABD pad Drawtex Off-Loading Turn and reposition every 2 hours Other: - abdominal binder to support abdomen. Patient to float right heel while in chair or bed with pillow. Patient to keep pressure off right heel as much as possible. Roll up a face cloth or a pillowcase to separate all skin folds- under breast, abdomen, both flank areas. [place] [facility] for wound care. - [facility] change once a week on mondays when patient comes to wound center on every other Thursdays. Home Health to change twice a week every other week Mondays and Thursdays when patient is not at wound center. Patient Medications Allergies: Flexeril, morphine, penicillin Notifications Medication Indication Start End Lotrisone 11/22/2018 DOSE topical 1 %-0.05 % cream - cream topical to affected area with dressing changes(continuing rx) Electronic Signature(s) Signed: 11/22/2018 1:25:56 PM By: Linton Ham MD Entered By: Linton Ham on 11/22/2018 13:25:55 -------------------------------------------------------------------------------- Problem List Details Patient Name: Date of Service: [name] Dakin. 11/22/2018 10:30 AM Medical Record [id number] Patient Account Number: [id number] Date of Birth/Sex: Treating RN: [birth year] (63 y.o. [name] Primary Care Provider: SYSTEM, PCP Other Clinician: Referring Provider: Treating Provider/Extender:Robson, Dulcy Fanny, Fuller Plan in Treatment: 73 Active Problems ICD-10 Evaluated Encounter Code Description Active Date Today Diagnosis T81.31XD Disruption of external operation (surgical) wound, not 06/29/2017 No Yes elsewhere classified, subsequent encounter S31.104D Unspecified open wound of abdominal wall, left lower 06/29/2017 No  Yes quadrant without penetration into peritoneal cavity, subsequent encounter L98.498 Non-pressure chronic ulcer of skin of other sites with 06/14/2018 No Yes other specified severity I89.0 Lymphedema, not elsewhere classified 08/30/2018 No Yes B35.4 Tinea corporis 08/30/2018 No Yes Inactive Problems ICD-10 Code Description Active Date Inactive Date M71.011 Abscess of bursa, right shoulder 06/29/2017 06/29/2017 L89.616 Pressure-induced deep tissue damage of right heel 06/07/2018 06/07/2018 L89.150 Pressure ulcer of sacral region, unstageable 06/14/2018 06/14/2018 Resolved Problems Electronic Signature(s) Signed: 11/22/2018 6:13:48 PM By: Linton Ham MD Entered By: Linton Ham on 11/22/2018 13:17:48 -------------------------------------------------------------------------------- Progress Note Details Patient Name: Date of Service: [name] Dakin. 11/22/2018 10:30 AM Medical Record [id number] Patient Account Number: [id number] Date of Birth/Sex: Treating RN: 08/09/1955 (63 y.o. [name] Primary Care Provider: SYSTEM, PCP Other Clinician: Referring Provider: Treating Provider/Extender:Robson, Dulcy Fanny, Fuller Plan in Treatment: 73 Subjective History of Present Illness (HPI) 06/29/17; this is a 63 year old woman who is a poor historian. It appears that she's had limited medical care over the last 2 years or so and what she had his through the [place]. She is here for review of a draining area on her right shoulder anteriorly as well as a nonhealing surgical wound in the left lower abdomen. Per her description she had a hysterectomy in 2006 4 "tumors". About 9 months later she had have another operation in the same area because of "tumors" involving her fallopian tubes and ovary. Sounds as though the surgical wound in this area that took a long time to heal. Apparently the area on her left lower abdomen is been open for the last 6 or 7 months. She is been applying  topical antibiotics. 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 She had a fall in late 2018 and the open area on top of her shoulder. She is had  draining pus coming out of this wound since she's been applying topical antibiotics and a Band-Aid. She is not complaining of a lot of pain or fever although she did have chills today. She saw her doctor at the [place] yesterday she doesn't know her name she apparently was referred to a surgeon at the [facility] and [place] presumably an orthopedic surgeon to look at that shoulder. She also has a primary doctor in [place] by the name of Dr. Jimmye Norman who referred her here after seeing her within the last month. 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 We don't have a lot of information in her in Epic. Last hospitalization was from 11/21/16 through 11/24/16 with hypoglycemia felt to be secondary to sulfonylureas. There is no mention of a wound at that time. 07/07/17; patient's shoulder cultured MSSA. I put her on clindamycin to had some anaerobic coverage with caution about diarrhea. I had asked her to make an orthopedic follow-up at the [place] with a doctor that made the original surgery but she has not done that yet. ooShe also has a fairly large wound in the left lower abdomen in the middle of his surgical scar. This was apparently gynecologic surgery perhaps fibroid surgery. In any case these wounds are larger today. 07/21/17; the patient went to see the orthopedic doctor at the [facility] who did her original surgery surgery. Per the patient's description the draining area had closed down. He didn't think anything needed to be done there were no imaging studies. ooThe lower abdominal wound in the left mid pelvis area also looks somewhat better to me. There is less surface material on this still some probing areas but not a lot  of depth. We've been using silver alginate 08/03/17; the patient arrives back in 2 week follow-up. The draining sinus on her right shoulder has not reopened although she is complaining of widespread shoulder and other joint pain. She is using silver alginate to the remaining surgical wound on her abdomen. 08/18/17; the patient has 2 week follow-up appointment. The draining sinus on her right shoulder is not reopened. She is complaining of left shoulder pain today. The presumed surgical wound on her lower abdomen looks quite a bit better and this is really contracted quite nicely. When she first came in the deep sinus she had on the anterior shoulder cultured MSSA. I gave her clindamycin. I arranged for orthopedic follow-up at the [facility] however this is already closed nevertheless this was a deep probing tunnel and I think it needs to be watched for reopening. 09/07/17; surgical wound on her lower abdomen. This as 2 attached leaflets. The larger one is superiorly. A small probing area here with some sanguinous drainage. Surface needed debridement today. We've been using silver alginate however I changed this to silver collagen today 09/15/17. Surgical wound on her lower abdomen. No recent improvement although this is markedly improved from when we first started seeing her although the original problem was a wound on her right shoulder which is long since closed.switched her to Silver collagen last week She arrives in clinic today complaining of abdominal pain "so bad I almost went to the ER". She tells me she had an endoscopy and colonoscopy about a week ago. This was at the [place], I have no way to look at this. She is unaware of any biopsy was done 10/02/17 on evaluation today patient continues to do well in regard to her abdominal wound were using Prisma it's  obviously taken some time for this to heal but nonetheless I do think that she is making good progress currently. There does not appear to be any  evidence of infection at this time which is good news. She still does have some tunneling or undermining but again I think this is still doing well. 10/19/17; she continues to make good progress in this surgical abdominal wound using Prisma. This is come in nicely at one point had considerable depth. No evidence of infection. The patient is not complaining of pain in this area but is complaining of diabetic neuropathy pain in her right foot 11/02/17;the patient's wound is superficial and is really continued to make decent progress. I thought this would be more problematic when I first saw however this appears to a filled in nicely there is no depth. It exists in the crevice between what I think is lower abdominal hernias. She comes in complaining of generalized pain however she is running out of her narcotics providedto her by a local pain clinic. She is asking for home health however she is [place] as I understand we cannot order this. 11/16/2017; 2-week follow-up. She has a lower abdominal surgical wound and this appears to be making nice progress closing in nicely each time we see it. This was initially a deep crevice surgical area between what I think is to lower abdominal hernias. In spite of this wound has healthy granulation. There does not appear to be any depth at all. There is probably a lot of tensile stress on the wound area however in spite of this things actually look better 11/30/2017; 2-week follow-up. She is complaining of left lower quadrant abdominal pain it is difficult to characterize this clearly episodic and not really associated with nausea or vomiting. She follows at the [place]. Tells me she was recently seen there by her new primary physician. The wound is in the lower abdomen in the middle of what I think was a gynecologic surgical site. We have made progress with this. 12/14/2017; 2-week follow-up. The patient comes in today with an improved wound area on the lower mid  abdomen surgical scar which was the wound we have been following her for. Paradoxically she has developed a new open area just to the right of the midline almost a mirror imaging wound on the right. She states this came up about a week ago. She does not have a clear explanation. Both of these wounds are in surgical scar areas although from her I think this surgery was remote. She denies scratching this or traumatizing it. It is surrounded by 2 large areas that I suspect are herniations but I still cannot see how this would have come from trauma. 12/28/2017; the area on the left surgical scar in the lower abdomen is better however the area on the right that we identified last time is worse. Increase in dimensions. There is no obvious infection here. We have been using silver alginate. She tells me she has been to the ER at the [place] and they gave her doxycycline she also saw her primary doctor yesterday. I think she needs to be referred to dermatology consideration of a skin biopsy question panniculitis 01/11/2018; we continue to have improvement in the left lower mid abdominal wound however the area on the right that we identified in late October has gotten quite a bit worse. There is not appear to be surrounding infection although the patient is complaining of a lot of pain. We have been using silver  alginate. [facility] I think this should help 01/24/18 on evaluation today patient actually apparently has two abdominal wounds the one on the right is new here and unfortunately seems to be doing a little bit worse and that it's a little bit larger. Fortunately there does not appear to be any sign of infection at this time. No fevers, chills, nausea, or vomiting noted at this time. Overall again it sounds like this is a fairly complex issue and despite the fact that we been using silver nitrate in the [facility] Dressing still has some hyper  granulation. No fevers, chills, nausea, or vomiting noted at this time. 01/31/18 plan evaluation today patient appears to be doing better in general in regard to her wounds. She has been tolerating the dressing changes without complication. I do feel like that right now she's making some progress so she still is having some discomfort. 02/07/18 on evaluation today patient actually appears to be doing rather well in regard to her double ulcers as far as I'm concerned. With that being said she is somewhat frustrated in general in regard to the overall appearance of everything. I feel like this is unwarranted however since she seems to be making progress. 02/15/2018; lower mid abdomen right much greater than left although the left was the original wound. The area on the left looks really quite good and is smaller. The area on the right seems longer and measures worse in terms of dimensions. These areas are in the most dependent part of her large abdominal pannus and this may be the entire issue. at [facility] on the left there is lymphedema around the wound. The patient is seeing dermatology next week apparently in [facility] 03/01/2017; she likely had abdomen. Right now he other than left ankle both he has better today. I think both of these areas are smaller. Surface looks healthier she is due to see dermatology later this month. Using [facility] 1/23; abdominal wounds in the lower mid abdomen. This is in a very dependent area in her lower abdominal pannus and the simple tension in this area may have a lot to do with this. She also has constant chronic pruritus and scratching and I wonder if this has something to do with open wounds in this area. She tells me her daughter is helping her with the dressings currently and the original wound on the left side of her mid abdomen is better almost closed the larger area on the right also looks some better. She sees a dermatologist next week I have told her  to ask the dermatologist for help with generalized pruritus. The probable skin wounds that she has all over secondary to scratching. The wounds we have been treating may be related to simple tension in a dependent lymphedematous area. She could benefit from an abdominal binder to help support this area 2/13; abdominal wounds in the lower mid abdomen. Very dependent in her abdomen and this may be secondary to tension in the lower part of her abdominal pannus plus or minus edema. 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 She claims not to be scratching at the wound areas. 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 They give her gentian violet to put on the wound area  but this is already present and Hydrofera Blue. The original wound on the left is healed today. However the area on the right part of her mid abdominal pannus appears larger. We are going to try to work with her on getting an abdominal binder to help support the weight of the pannus around the wounds 3/9; abdominal wound in the lower mid abdomen. She apparently has an abdominal binder that she bought on [employer]. She is using Hydrofera Blue to the wound on the right of the midline. The original wound was on the left and is closed over. 3/26; abdominal wound in the lower mid abdomen. This is her second wound in this area. The lower part of her abdominal pannus surrounding surgical scar. Once again she is not wearing the abdominal binder she said she obtained. The dressing is Hydrofera Blue 4/16; abdomen in the right lower mid abdomen. This is the second wound in this area the other was a mirror image area on the left. I had her seen by dermatology at the [facility] this was not very helpful. She is not wearing her abdominal  binder. We have been using Hydrofera Blue which is what healed at the other side. The abdominal wound is actually larger She arrives in clinic today complaining of the plantar right heel. Very tender. She has been pushing herself up in her bed with her heels and this may be a deep tissue injury. 4/23; abdomen wound is about the same. Spreading along the folds of her pannus somewhat but generally the surface looks somewhat better. She is not using her abdominal binder although she did bring it in this time ooShe arrives in clinic complaining of pain and itching in the right lateral rib area. She has 3 open areas here which are circular wounds. These are new ooShe also mentions her lower sacrum and there are 2 wounds in this area. These are new 5/7; abdominal wound is about the same although in general it looks better than a few weeks ago at which time it had punched out depth. No debridement is required. She will not wear her abdominal binder ooShe has areas on her right lateral rib cage 3 open areas circular wounds these look about the same as last time. I am assuming these have something to do with the prurigo nodularis that she may have scratched but I am not certain. Finally she has a linear area on her lower sacrum which I am assuming is a pressure area 5/21-Patient returns at 2 weeks for abdominal wound, areas on the right lateral rib cage, and lower sacrum linear area which is a pressure sore. We have been using silver alginate to all these wounds. She has been asked to wear abdominal binder which she is not. she has 2 new wounds on right flank area and on her coccyx. she has a total of 6 wounds 6/9; I have been following this patient for a prolonged period of time for wounds on her lower mid abdomen which I think were secondary to severe lymphedema. She had an area on the left of the midline that eventually healed only to open on the right side. This is still open we have been using  silver alginate. She will not use an abdominal binder. About a month ago she arrived here with open areas on the right lateral ribs. This is in the middle of a large amount of soft tissue/pannus fold. She has 4 open areas here at this time. Finally she has 2  open areas in her lower coccyx. We have been using silver alginate on all the wound areas 6/25; we are following this woman for wounds on her lower mid abdomen in the setting of a pannus and severe dependent lymphedema. More recently she has had areas in the folds on the right lateral rib area. Culture I did of this area last time was negative. Finally she has an area on the coccyx. We have been using silver alginate to all the wound areas 7/9; this is a patient with morbid obesity severe lymphedema in her lower abdominal pannus/severe dependent abdominal lymphedema. She first came to this clinic with an area on the left lower central abdomen. This eventually healed over. About the same time as it did heal over she developed an area in close juxtaposition on the other side of the midline. We have been following this for some months when she was here last time I thought things were improving although she a lot arrives today with a larger wound. She will not use her external abdominal binder. Over the last several weeks she has had areas on her right lateral rib area. The exact etiology of this is unclear we have been using silver alginate. She arrives today with the entire area inflamed angry with loss of surface epithelium and marked pruritus. I think this is tinea/Candida. This could be secondary or the primary in the etiology of these expanding areas. 7/23; the area on the right lateral rib areas is quite a bit better. Skin that we treated with oral and topical antifungals looks a lot better around this area. Unfortunately the lower abdominal area actually measures larger. There is no drainage here really no surrounding  erythema. 8/6-Patient arrives to the clinic at 3 weeks, the right lateral rib cage area has copious drainage but the wounds look a little better, the abdominal midline wound unfortunately is larger, she is not able to use the binder both wounds have a clean base 8/20. Since the patient was last here she was hospitalized from 813 through 818. This was predominantly because of hyperkalemia secondary to lisinopril and dehydration. Lisinopril was discontinued at discharge. She was also felt to have mild cellulitis around several skin ulcers she was given antibiotics in the hospital but not discharged on antibiotics. Also noted that she has stage IV chronic kidney disease with a baseline creatinine at 1.7. 9/3 the patient still has a 2 wound areas lower mid abdominal pannus and the right lateral ribs. We have been using polymen to the abdominal wound and silver alginate to the area on the folds on her right lateral ribs. She is complaining about pruritus around the areas on the ribs. 9/17; 2-week follow-up. The area on the mid lower abdomen better using PolyMem. She has 4 small areas in the pannus fold on the right lateral ribs and is a new area on the left lateral lower abdominal folds this week. 10/1; 2-week follow-up. She has been using PolyMem on the mid lower abdomen and really not much improvement. She has this for small areas in the pannus fold of the right lateral ribs. These are gradually I think improving. The area that was new last time on the left lower abdominal pannus has closed over. Objective Constitutional Sitting or standing Blood Pressure is within target range for patient.. Pulse regular and within target range for patient.Marland Kitchen Respirations regular, non-labored and within target range.. Temperature is normal and within the target range for the patient.Marland Kitchen Appears in no distress. Vitals  Time Taken: 11:35 AM, Height: 62 in, Weight: 260 lbs, BMI: 47.5, Temperature: 97.9 F, Pulse: 70  bpm, Respiratory Rate: 19 breaths/min, Blood Pressure: 134/55 mmHg. Eyes Conjunctivae clear. No discharge.no icterus. Respiratory work of breathing is normal. Gastrointestinal (GI) Morbidly obese no tenderness.Marland Kitchen Psychiatric appears at normal baseline. General Notes: Wound exam ooMidline abdomen about the same. In fact I think there is less of the midline epithelialization than she had. Surrounding granulation still looks healthy. ooThe area on the right lateral ribs for open areas. I think these actually look healthier. 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 Wound #3 status is Open. Original cause of wound was Gradually Appeared. The wound is located on the Right Abdomen - Lower Quadrant. The wound measures 7cm length x 4.4cm width x 0.1cm depth; 24.19cm^2 area and 2.419cm^3 volume. There is Fat Layer (Subcutaneous Tissue) Exposed exposed. There is no tunneling or undermining noted. There is a small amount of serosanguineous drainage noted. The wound margin is distinct with the outline attached to the wound base. There is large (67-100%) red, pink granulation within the wound bed. There is a small (1-33%) amount of necrotic tissue within the wound bed including Adherent Slough. Wound #4 status is Open. Original cause of wound was Gradually Appeared. The wound is located on the Right Flank. The wound measures 1.7cm length x 10.2cm width x 0.3cm depth; 13.619cm^2 area and 4.086cm^3 volume. There is Fat Layer (Subcutaneous Tissue) Exposed exposed. There is no tunneling or undermining noted. There is a medium amount of serosanguineous drainage noted. The wound margin is distinct with the outline attached to the wound base. There is large (67-100%) pink, pale granulation within the wound bed. There is a small (1-33%) amount of necrotic tissue within the wound bed including Adherent Slough. Wound #6 status is  Open. Original cause of wound was Gradually Appeared. The wound is located on the Left Flank. The wound measures 0cm length x 0cm width x 0cm depth; 0cm^2 area and 0cm^3 volume. There is no tunneling or undermining noted. There is a none present amount of drainage noted. The wound margin is flat and intact. There is no granulation within the wound bed. There is no necrotic tissue within the wound bed. Assessment Active Problems ICD-10 Disruption of external operation (surgical) wound, not elsewhere classified, subsequent encounter Unspecified open wound of abdominal wall, left lower quadrant without penetration into peritoneal cavity, subsequent encounter Non-pressure chronic ulcer of skin of other sites with other specified severity Lymphedema, not elsewhere classified Tinea corporis Plan Follow-up Appointments: Return Appointment in 2 weeks. Dressing Change Frequency: Wound #3 Right Abdomen - Lower Quadrant: Other: - change dressing twice a week. Wound #4 Right Flank: Other: - change dressing twice a week. Skin Barriers/Peri-Wound Care: Wound #3 Right Abdomen - Lower Quadrant: Skin Prep Wound #4 Right Flank: Skin Prep Antifungal cream - apply antifungal cream with dressing changes. Wound Cleansing: May shower and wash wound with soap and water. Primary Wound Dressing: Wound #3 Right Abdomen - Lower Quadrant: Collagen - moisten with hydrogel Wound #4 Right Flank: Calcium Alginate with Silver Secondary Dressing: Wound #3 Right Abdomen - Lower Quadrant: Dry Gauze ABD pad - secure with tape Wound #4 Right Flank: ABD pad Drawtex Off-Loading: Turn and reposition every 2 hours Other: - abdominal binder to support abdomen. Patient to float right heel while in chair or bed with pillow. Patient to keep pressure off right heel as much as possible. Roll up a  face cloth or a pillowcase to separate all skin folds- under breast, abdomen, both flank areas. Home Health: [place] [facility] [facility] for [facility] - [facility] change once a week on mondays when patient comes to wound center on every other Thursdays. Home Health to change twice a week every other week Mondays and Thursdays when patient is not at wound center. The following medication(s) was prescribed: Lotrisone topical 1 %-0.05 % cream cream topical to affected area with dressing changes(continuing rx) starting 11/22/2018 1. I change the primary dressing on the lower abdomen to regular collagen moistened 2. Continue with silver alginate on the ribs 3. Continue Lotrisone Electronic Signature(s) Signed: 11/22/2018 1:26:20 PM By: Linton Ham MD Entered By: Linton Ham on 11/22/2018 13:26:19 -------------------------------------------------------------------------------- SuperBill Details Patient Name: Date of Service: [name] [name], [name] [name] 11/22/2018 Medical Record Number:[id number] Patient Account Number: [id number] Date of Birth/Sex: Treating RN: 1955/10/29 (63 y.o. Helene Shoe, Tammi Klippel Primary Care Provider: SYSTEM, PCP Other Clinician: Referring Provider: Treating Provider/Extender:Robson, Dulcy Fanny, Fuller Plan in Treatment: 73 Diagnosis Coding ICD-10 Codes Code Description Disruption of external operation (surgical) wound, not elsewhere classified, subsequent T81.31XD encounter Unspecified open wound of abdominal wall, left lower quadrant without penetration into peritoneal S31.104D cavity, subsequent encounter L98.498 Non-pressure chronic ulcer of skin of other sites with other specified severity I89.0 Lymphedema, not elsewhere classified B35.4 Tinea corporis Facility Procedures CPT4 Code: 11031594 Description: 58592 - WOUND CARE VISIT-LEV 5 EST PT Modifier: Quantity: 1 Electronic Signature(s) Signed: 11/22/2018 6:13:48 PM By: Linton Ham MD Signed: 11/22/2018 6:31:45 PM By: Deon Pilling Entered By: Deon Pilling on 11/22/2018 12:59:57

## 2018-11-29 NOTE — Progress Notes (Signed)
EMMANUELA, GHAZI ([id number]) Visit Report for 11/08/2018 Arrival Information Details Patient Name: Date of Service: [name] [name], [name] [name] 11/08/2018 10:30 AM Medical Record Number:[id number] Patient Account Number: [id number] Date of Birth/Sex: Treating RN: 07-16-1955 (63 y.o. [name] Primary Care Quint Chestnut: SYSTEM, PCP Other Clinician: Referring Aleida Crandell: Treating Charlcie Prisco/Extender:Robson, Dulcy Fanny, Fuller Plan in Treatment: 14 Visit Information History Since Last Visit All ordered tests and consults were completed: No Patient Arrived: Wheel Chair Added or deleted any medications: No Arrival Time: 12:03 Any new allergies or adverse reactions: No Accompanied By: family member Had a fall or experienced change in No Transfer Assistance: EasyPivot activities of daily living that may affect Patient Lift risk of falls: Patient Identification Verified: Yes Signs or symptoms of abuse/neglect since last No Secondary Verification Process Yes visito Completed: Hospitalized since last visit: No Patient Requires Transmission-Based No Implantable device outside of the clinic excluding No Precautions: cellular tissue based products placed in the center Patient Has Alerts: No since last visit: Pain Present Now: Yes Electronic Signature(s) Signed: 11/29/2018 4:32:31 PM By: Kela Millin Entered By: Kela Millin on 11/08/2018 12:06:37 -------------------------------------------------------------------------------- Clinic Level of Care Assessment Details Patient Name: Date of Service: [name] [name], [name] [name] 11/08/2018 10:30 AM Medical Record Number:[id number] Patient Account Number: [id number] Date of Birth/Sex: Treating RN: [birth year] (63 y.o. Helene Shoe, Tammi Klippel Primary Care Keashia Haskins: SYSTEM, PCP Other Clinician: Referring Janari Yamada: Treating Dereon Williamsen/Extender:Robson, Dulcy Fanny, Fuller Plan in Treatment: 71 Clinic Level of Care Assessment Items TOOL  4 Quantity Score X - Use when only an EandM is performed on FOLLOW-UP visit 1 0 ASSESSMENTS - Nursing Assessment / Reassessment X - Reassessment of Co-morbidities (includes updates in patient status) 1 10 X - Reassessment of Adherence to Treatment Plan 1 5 ASSESSMENTS - Wound and Skin Assessment / Reassessment []  - Simple Wound Assessment / Reassessment - one wound 0 X - Complex Wound Assessment / Reassessment - multiple wounds 3 5 X - Dermatologic / Skin Assessment (not related to wound area) 1 10 ASSESSMENTS - Focused Assessment []  - Circumferential Edema Measurements - multi extremities 0 X - Nutritional Assessment / Counseling / Intervention 1 10 []  - Lower Extremity Assessment (monofilament, tuning fork, pulses) 0 []  - Peripheral Arterial Disease Assessment (using hand held doppler) 0 ASSESSMENTS - Ostomy and/or Continence Assessment and Care []  - Incontinence Assessment and Management 0 []  - Ostomy Care Assessment and Management (repouching, etc.) 0 PROCESS - Coordination of Care []  - Simple Patient / Family Education for ongoing care 0 X - Complex (extensive) Patient / Family Education for ongoing care 1 20 X - Staff obtains Programmer, systems, Records, Test Results / Process Orders 1 10 []  - Staff telephones HHA, Nursing Homes / Clarify orders / etc 0 []  - Routine Transfer to another Facility (non-emergent condition) 0 []  - Routine Hospital Admission (non-emergent condition) 0 []  - New Admissions / Biomedical engineer / Ordering NPWT, Apligraf, etc. 0 []  - Emergency Hospital Admission (emergent condition) 0 []  - Simple Discharge Coordination 0 X - Complex (extensive) Discharge Coordination 1 15 PROCESS - Special Needs []  - Pediatric / Minor Patient Management 0 []  - Isolation Patient Management 0 []  - Hearing / Language / Visual special needs 0 []  - Assessment of Community assistance (transportation, D/C planning, etc.) 0 []  - Additional assistance / Altered mentation 0 []  -  Support Surface(s) Assessment (bed, cushion, seat, etc.) 0 INTERVENTIONS - Wound Cleansing / Measurement []  - Simple Wound Cleansing - one wound 0 X - Complex Wound Cleansing - multiple wounds 3  5 X - Wound Imaging (photographs - any number of wounds) 1 5 []  - Wound Tracing (instead of photographs) 0 []  - Simple Wound Measurement - one wound 0 X - Complex Wound Measurement - multiple wounds 3 5 INTERVENTIONS - Wound Dressings []  - Small Wound Dressing one or multiple wounds 0 X - Medium Wound Dressing one or multiple wounds 3 15 []  - Large Wound Dressing one or multiple wounds 0 []  - Application of Medications - topical 0 []  - Application of Medications - injection 0 INTERVENTIONS - Miscellaneous []  - External ear exam 0 []  - Specimen Collection (cultures, biopsies, blood, body fluids, etc.) 0 []  - Specimen(s) / Culture(s) sent or taken to Lab for analysis 0 []  - Patient Transfer (multiple staff / Civil Service fast streamer / Similar devices) 0 []  - Simple Staple / Suture removal (25 or less) 0 []  - Complex Staple / Suture removal (26 or more) 0 []  - Hypo / Hyperglycemic Management (close monitor of Blood Glucose) 0 []  - Ankle / Brachial Index (ABI) - do not check if billed separately 0 X - Vital Signs 1 5 Has the patient been seen at the hospital within the last three years: Yes Total Score: 180 Level Of Care: New/Established - Level 5 Electronic Signature(s) Signed: 11/08/2018 5:51:26 PM By: Deon Pilling Entered By: Deon Pilling on 11/08/2018 17:23:04 -------------------------------------------------------------------------------- Encounter Discharge Information Details Patient Name: Date of Service: [name]. 11/08/2018 10:30 AM Medical Record [id number] Patient Account Number: [id number] Date of Birth/Sex: Treating RN: December 23, 1955 (63 y.o. [name] Primary Care Judith Campillo: SYSTEM, PCP Other Clinician: Referring Shamariah Shewmake: Treating Grady Lucci/Extender:Robson,  Dulcy Fanny, Fuller Plan in Treatment: 78 Encounter Discharge Information Items Discharge Condition: Stable Ambulatory Status: Wheelchair Discharge Destination: Home Transportation: Private Auto Accompanied By: Charolett Bumpers Schedule Follow-up Appointment: Yes Clinical Summary of Care: Patient Declined Electronic Signature(s) Signed: 11/08/2018 5:54:30 PM By: Baruch Gouty RN, BSN Entered By: Baruch Gouty on 11/08/2018 14:47:22 -------------------------------------------------------------------------------- Lower Extremity Assessment Details Patient Name: Date of Service: [name] [name], [name] [name]. 11/08/2018 10:30 AM Medical Record Number:[id number] Patient Account Number: [id number] Date of Birth/Sex: Treating RN: 1955-08-30 (63 y.o. [name] Primary Care Tristen Luce: SYSTEM, PCP Other Clinician: Referring Alizae Bechtel: Treating Marko Skalski/Extender:Robson, Dulcy Fanny, Fuller Plan in Treatment: 57 Electronic Signature(s) Signed: 11/29/2018 4:32:31 PM By: Kela Millin Entered By: Kela Millin on 11/08/2018 12:08:12 -------------------------------------------------------------------------------- Multi Wound Chart Details Patient Name: Date of Service: [name]. 11/08/2018 10:30 AM Medical Record Number:[id number] Patient Account Number: [id number] Date of Birth/Sex: Treating RN: 02-03-1956 (63 y.o. Helene Shoe, Tammi Klippel Primary Care Merridy Pascoe: SYSTEM, PCP Other Clinician: Referring Manuella Blackson: Treating Primus Gritton/Extender:Robson, Dulcy Fanny, Fuller Plan in Treatment: 71 Vital Signs Height(in): 67 Pulse(bpm): 76 Weight(lbs): 260 Blood Pressure(mmHg): 166/80 Body Mass Index(BMI): 48 Temperature(F): 98.1 Respiratory 19 Rate(breaths/min): Photos: [3:No Photos] [4:No Photos] [6:No Photos] Wound Location: [3:Right Abdomen - Lower Quadrant] [4:Right Flank] [6:Left Flank] Wounding Event: [3:Gradually Appeared] [4:Gradually Appeared] [6:Gradually  Appeared] Primary Etiology: [3:Dehisced Wound] [4:MASD Moisture Associated Skin Damage] [6:Inflammatory] Comorbid History: [3:Cataracts, Lymphedema, Cataracts, Lymphedema, Cataracts, Lymphedema, Sleep Apnea, Hypertension, Sleep Apnea, Hypertension, Sleep Apnea, Hypertension, Type II Diabetes, End Stage Type II Diabetes, End Stage Type II Diabetes, End Stage  Renal Disease, Gout, Rheumatoid Arthritis, Neuropathy] [4:Renal Disease, Gout, Rheumatoid Arthritis, Neuropathy] [6:Renal Disease, Gout, Rheumatoid Arthritis, Neuropathy] Date Acquired: [3:12/07/2017] [4:05/31/2018] [6:11/05/2018] Weeks of Treatment: [3:47] [4:21] [6:0] Wound Status: [3:Open] [4:Open] [6:Open] Clustered Wound: [3:No] [4:Yes] [6:No] Clustered Quantity: [3:N/A] [4:3] [6:N/A] Measurements L x W x D 7.5x4.7x0.1 [4:2.5x10.4x0.3] [6:0.5x1.5x0.2] (cm) Area (cm) : [6:60.630] [  4:20.42] [6:0.589] Volume (cm) : [3:2.769] [4:6.126] [6:0.118] % Reduction in Area: [3:-327.20%] [4:-151.20%] [6:0.00%] % Reduction in Volume: 52.50% [4:-653.50%] [6:0.00%] Classification: [3:Full Thickness Without Exposed Support Structures] [4:N/A] [6:Full Thickness Without Exposed Support Structures] Exudate Amount: [3:Small] [4:Medium] [6:Small] Exudate Type: [3:Serosanguineous] [4:Serosanguineous] [6:Serosanguineous] Exudate Color: [3:red, brown] [4:red, brown] [6:red, brown] Wound Margin: [3:Flat and Intact] [4:Flat and Intact] [6:N/A] Granulation Amount: [3:Large (67-100%)] [4:Large (67-100%)] [6:Medium (34-66%)] Granulation Quality: [3:Red, Pink] [4:Red, Pink] [6:Pink] Necrotic Amount: [3:Small (1-33%)] [4:None Present (0%)] [6:Medium (34-66%)] Exposed Structures: [3:Fat Layer (Subcutaneous Fat Layer (Subcutaneous Fat Layer (Subcutaneous Tissue) Exposed: Yes Fascia: No Tendon: No Muscle: No Joint: No Bone: No Small (1-33%)] [4:Tissue) Exposed: Yes Fascia: No Tendon: No Muscle: No Joint: No Bone: No Small (1-33%)]  [6:Tissue) Exposed: Yes Fascia: No  Tendon: No Muscle: No Joint: No Bone: No None] Treatment Notes Wound #3 (Right Abdomen - Lower Quadrant) 2. Periwound Care Skin Prep 3. Primary Dressing Applied Polymem Ag 4. Secondary Dressing ABD Pad Dry Gauze 5. Secured With Tape Wound #4 (Right Flank) 2. Periwound Care Antifungal cream 3. Primary Dressing Applied Calcium Alginate Ag 4. Secondary Dressing ABD Pad Dry Gauze 5. Secured With Tape Wound #6 (Left Flank) 2. Periwound Care Antifungal cream 3. Primary Dressing Applied Calcium Alginate Ag 4. Secondary Dressing ABD Pad Dry Gauze 5. Secured With Recruitment consultant) Signed: 11/08/2018 5:09:12 PM By: Linton Ham MD Signed: 11/08/2018 5:51:26 PM By: Deon Pilling Entered By: Linton Ham on 11/08/2018 15:23:54 -------------------------------------------------------------------------------- Multi-Disciplinary Care Plan Details Patient Name: Date of Service: [name]. 11/08/2018 10:30 AM Medical Record Number:[id number] Patient Account Number: [id number] Date of Birth/Sex: Treating RN: 1955/05/20 (63 y.o. [name] Primary Care Alleene Stoy: SYSTEM, PCP Other Clinician: Referring Velna Hedgecock: Treating Isaic Syler/Extender:Robson, Dulcy Fanny, Fuller Plan in Treatment: 71 Active Inactive Wound/Skin Impairment Nursing Diagnoses: Impaired tissue integrity Goals: Patient/caregiver will verbalize understanding of skin care regimen Date Initiated: 09/07/2017 Target Resolution Date: 12/21/2018 Goal Status: Active Ulcer/skin breakdown will have a volume reduction of 30% by week 4 Date Initiated: 06/29/2017 Date Inactivated: 08/04/2017 Target Resolution Date: 09/01/2017 Goal Status: Unmet Unmet Reason: larger Interventions: Assess patient/caregiver ability to obtain necessary supplies Assess patient/caregiver ability to perform ulcer/skin care regimen upon admission and as needed Assess ulceration(s) every visit Provide education on  ulcer and skin care Notes: Electronic Signature(s) Signed: 11/08/2018 5:51:26 PM By: Deon Pilling Entered By: Deon Pilling on 11/08/2018 11:51:20 -------------------------------------------------------------------------------- Pain Assessment Details Patient Name: Date of Service: Arielis, Leonhart 11/08/2018 10:30 AM Medical Record Number:[id number] Patient Account Number: [id number] Date of Birth/Sex: Treating RN: [birth year] (63 y.o. [name] Primary Care Shervin Cypert: SYSTEM, PCP Other Clinician: Referring Montrice Montuori: Treating Fernanda Twaddell/Extender:Robson, Dulcy Fanny, Fuller Plan in Treatment: 71 Active Problems Location of Pain Severity and Description of Pain Patient Has Paino Yes Site Locations Pain Location: Pain in Ulcers With Dressing Change: Yes Rate the pain. Current Pain Level: 8 Worst Pain Level: 10 Least Pain Level: 5 Tolerable Pain Level: 4 Character of Pain Describe the Pain: Burning, Sharp Pain Management and Medication Current Pain Management: Electronic Signature(s) Signed: 11/29/2018 4:32:31 PM By: Kela Millin Entered By: Kela Millin on 11/08/2018 12:08:05 -------------------------------------------------------------------------------- Patient/Caregiver Education Details Patient Name: Date of Service: [name] 9/17/2020andnbsp10:30 AM Medical Record [phone] Patient Account Number: [id number] Date of Birth/Gender: Treating RN: 1955-07-09 (63 y.o. [name] Primary Care Physician: SYSTEM, PCP Other Clinician: Referring Physician: Treating Physician/Extender:Robson, Claudette Head in Treatment: 28 Education Assessment Education Provided To: Patient Education Topics Provided Wound/Skin Impairment: Handouts: Caring  for Your Ulcer Methods: Explain/Verbal Responses: Reinforcements needed Electronic Signature(s) Signed: 11/08/2018 5:51:26 PM By: Deon Pilling Entered By: Deon Pilling on 11/08/2018 11:51:34 -------------------------------------------------------------------------------- Wound Assessment Details Patient Name: Date of Service: [name] [name], [name] [name]. 11/08/2018 10:30 AM Medical Record Number:[id number] Patient Account Number: [id number] Date of Birth/Sex: Treating RN: August 30, 1955 (63 y.o. [name] Primary Care Calton Harshfield: SYSTEM, PCP Other Clinician: Referring Duwan Adrian: Treating Damarion Mendizabal/Extender:Robson, Dulcy Fanny, Fuller Plan in Treatment: 71 Wound Status Wound Number: 3 Primary Dehisced Wound Etiology: Wound Location: Right Abdomen - Lower Quadrant Wound Open Wounding Event: Gradually Appeared Status: Date Acquired: 12/07/2017 Comorbid Cataracts, Lymphedema, Sleep Apnea, Weeks Of Treatment: 47 History: Hypertension, Type II Diabetes, End Stage Clustered Wound: No Renal Disease, Gout, Rheumatoid Arthritis, Neuropathy Photos Wound Measurements Length: (cm) 7.5 % Reduct Width: (cm) 4.7 % Reduct Depth: (cm) 0.1 Epitheli Area: (cm) 27.685 Tunneli Volume: (cm) 2.769 Undermi Wound Description Classification: Full Thickness Without Exposed Support Foul Odo Structures Slough/F Wound Flat and Intact Margin: Exudate Small Amount: Exudate Serosanguineous Type: Exudate red, brown Color: Wound Bed Granulation Amount: Large (67-100%) Granulation Quality: Red, Pink Fascia E Necrotic Amount: Small (1-33%) Fat Laye Necrotic Quality: Adherent Slough Tendon E Muscle E Joint Ex Bone Exp r After Cleansing: No ibrino Yes Exposed Structure xposed: No r (Subcutaneous Tissue) Exposed: Yes xposed: No xposed: No posed: No osed: No ion in Area: -327.2% ion in Volume: 52.5% alization: Small (1-33%) ng: No ning: No Electronic Signature(s) Signed: 11/09/2018 3:55:03 PM By: Mikeal Hawthorne EMT/HBOT Signed: 11/29/2018 4:32:31 PM By: Kela Millin Entered By: Mikeal Hawthorne on 11/09/2018  08:34:39 -------------------------------------------------------------------------------- Wound Assessment Details Patient Name: Date of Service: [name]. 11/08/2018 10:30 AM Medical Record [id number] Patient Account Number: [id number] Date of Birth/Sex: Treating RN: 11/19/1955 (63 y.o. [name] Primary Care Kaylynne Andres: SYSTEM, PCP Other Clinician: Referring Jayvyn Haselton: Treating Ciara Kagan/Extender:Robson, Dulcy Fanny, Fuller Plan in Treatment: 71 Wound Status Wound Number: 4 Primary MASD Moisture Associated Skin Damage Etiology: Wound Location: Right Flank Wound Open Wounding Event: Gradually Appeared Status: Date Acquired: 05/31/2018 Comorbid Cataracts, Lymphedema, Sleep Apnea, Weeks Of Treatment: 21 History: Hypertension, Type II Diabetes, End Stage Clustered Wound: Yes Renal Disease, Gout, Rheumatoid Arthritis, Neuropathy Photos Wound Measurements Length: (cm) 2.5 Width: (cm) 10.4 Depth: (cm) 0.3 Clustered Quantity: 3 Area: (cm) 20.42 Volume: (cm) 6.126 Wound Description Wound Margin: Flat and Intact Exudate Amount: Medium Exudate Type: Serosanguineous Exudate Color: red, brown Wound Bed Granulation Amount: Large (67-100%) Granulation Quality: Red, Pink Necrotic Amount: None Present (0%) ter Cleansing: No no No Exposed Structure ed: No ubcutaneous Tissue) Exposed: Yes ed: No ed: No d: No : No % Reduction in Area: -151.2% % Reduction in Volume: -653.5% Epithelialization: Small (1-33%) Tunneling: No Undermining: No Foul Odor Af Slough/Fibri Fascia Expos Fat Layer (S Tendon Expos Muscle Expos Joint Expose Bone Exposed Electronic Signature(s) Signed: 11/09/2018 3:55:03 PM By: Mikeal Hawthorne EMT/HBOT Signed: 11/29/2018 4:32:31 PM By: Kela Millin Entered By: Mikeal Hawthorne on 11/09/2018 08:34:15 -------------------------------------------------------------------------------- Wound Assessment Details Patient  Name: Date of Service: [name]. 11/08/2018 10:30 AM Medical Record [id number] Patient Account Number: [id number] Date of Birth/Sex: Treating RN: 12-04-1955 (63 y.o. [name] Primary Care Ellajane Stong: SYSTEM, PCP Other Clinician: Referring Kathe Wirick: Treating Shiloh Southern/Extender:Robson, Dulcy Fanny, Fuller Plan in Treatment: 71 Wound Status Wound Number: 6 Primary Inflammatory Etiology: Wound Location: Left Flank Wound Open Wounding Event: Gradually Appeared Status: Date Acquired: 11/05/2018 Comorbid Cataracts, Lymphedema, Sleep Apnea, Weeks Of Treatment: 0 History: Hypertension, Type II Diabetes, End Stage Clustered Wound: No Renal  Disease, Gout, Rheumatoid Arthritis, Neuropathy Photos Wound Measurements Length: (cm) 0.5 % Reduct Width: (cm) 1.5 % Reduct Depth: (cm) 0.2 Epitheli Area: (cm) 0.589 Tunneli Volume: (cm) 0.118 Undermi Wound Description Classification: Full Thickness Without Exposed Support Foul Od Structures Slough/ Exudate Small Amount: Exudate Serosanguineous Type: Exudate red, brown Color: Wound Bed Granulation Amount: Medium (34-66%) Granulation Quality: Pink Fascia Necrotic Amount: Medium (34-66%) Fat Lay Necrotic Quality: Adherent Slough Tendon Muscle Joint E Bone Ex Electronic Signature(s) Signed: 11/09/2018 3:55:03 PM By: Mikeal Hawthorne EMT/HBOT Signed: 11/29/2018 4:32:31 PM By: Kela Millin Entered By: Mikeal Hawthorne on 09/18/ or After Cleansing: No Fibrino Yes Exposed Structure Exposed: No er (Subcutaneous Tissue) Exposed: Yes Exposed: No Exposed: No xposed: No posed: No 2020 08:35:04 ion in Area: 0% ion in Volume: 0% alization: None ng: No ning: No -------------------------------------------------------------------------------- Vitals Details Patient Name: Date of Service: [name] [name], [name] [name] 11/08/2018 10:30 AM Medical Record Number:[id number] Patient Account Number: [id number] Date of  Birth/Sex: Treating RN: 02-04-1956 (63 y.o. [name] Primary Care Glorian Mcdonell: SYSTEM, PCP Other Clinician: Referring Marybell Robards: Treating Raysean Graumann/Extender:Robson, Dulcy Fanny, Fuller Plan in Treatment: 22 Vital Signs Time Taken: 11:58 Temperature (F): 98.1 Height (in): 62 Pulse (bpm): 93 Weight (lbs): 260 Respiratory Rate (breaths/min): 19 Body Mass Index (BMI): 47.5 Blood Pressure (mmHg): 166/80 Reference Range: 80 - 120 mg / dl Electronic Signature(s) Signed: 11/29/2018 4:32:31 PM By: Kela Millin Entered By: Kela Millin on 11/08/2018 12:07:29

## 2018-11-30 NOTE — Progress Notes (Signed)
KEYLEN, UZELAC ([id number]) Visit Report for 11/22/2018 Arrival Information Details Patient Name: Date of Service: [name] [name], [name] [name] 11/22/2018 10:30 AM Medical Record Number:[id number] Patient Account Number: [id number] Date of Birth/Sex: Treating RN: 1955/10/15 (63 y.o. [name] [name] Primary Care [name] [name]: SYSTEM, PCP Other Clinician: Referring [name] [name]: Treating [name] [name]/Extender:[name], [name] Fanny, [name] Plan in Treatment: 69 Visit Information History Since Last Visit Added or deleted any medications: No Patient Arrived: Wheel Chair Any new allergies or adverse reactions: No Arrival Time: 11:35 Had a fall or experienced change in No Accompanied By: sister activities of daily living that may affect Transfer Assistance: EasyPivot risk of falls: Patient Lift Signs or symptoms of abuse/neglect since last visito No Patient Identification Verified: Yes Hospitalized since last visit: No Secondary Verification Process Yes Implantable device outside of the clinic excluding No Completed: cellular tissue based products placed in the center Patient Requires Transmission-Based No since last visit: Precautions: Pain Present Now: No Patient Has Alerts: No Electronic Signature(s) Signed: 11/30/2018 5:13:27 PM By: [name] [name] Entered By: [name] [name] on 11/22/2018 11:35:47 -------------------------------------------------------------------------------- Clinic Level of Care Assessment Details Patient Name: Date of Service: [name] [name], [name] [name] 11/22/2018 10:30 AM Medical Record Number:[id number] Patient Account Number: [id number] Date of Birth/Sex: Treating RN: 03-Sep-1955 (63 y.o. [name] [name], [name] [name] Primary Care [name] [name]: SYSTEM, PCP Other Clinician: Referring [name] [name]: Treating [name] [name]/Extender:[name], [name] Fanny, [name] Plan in Treatment: 73 Clinic Level of Care Assessment Items TOOL 4 Quantity Score X - Use when only an EandM is performed on  FOLLOW-UP visit 1 0 ASSESSMENTS - Nursing Assessment / Reassessment X - Reassessment of Co-morbidities (includes updates in patient status) 1 10 X - Reassessment of Adherence to Treatment Plan 1 5 ASSESSMENTS - Wound and Skin Assessment / Reassessment []  - Simple Wound Assessment / Reassessment - one wound 0 X - Complex Wound Assessment / Reassessment - multiple wounds 3 5 X - Dermatologic / Skin Assessment (not related to wound area) 1 10 ASSESSMENTS - Focused Assessment []  - Circumferential Edema Measurements - multi extremities 0 X - Nutritional Assessment / Counseling / Intervention 1 10 []  - Lower Extremity Assessment (monofilament, tuning fork, pulses) 0 []  - Peripheral Arterial Disease Assessment (using hand held doppler) 0 ASSESSMENTS - Ostomy and/or Continence Assessment and Care []  - Incontinence Assessment and Management 0 []  - Ostomy Care Assessment and Management (repouching, etc.) 0 PROCESS - Coordination of Care []  - Simple Patient / Family Education for ongoing care 0 X - Complex (extensive) Patient / Family Education for ongoing care 1 20 X - Staff obtains Programmer, systems, Records, Test Results / Process Orders 1 10 X - Staff telephones HHA, Nursing Homes / Clarify orders / etc 1 10 []  - Routine Transfer to another Facility (non-emergent condition) 0 []  - Routine Hospital Admission (non-emergent condition) 0 []  - New Admissions / Biomedical engineer / Ordering NPWT, Apligraf, etc. 0 []  - Emergency Hospital Admission (emergent condition) 0 []  - Simple Discharge Coordination 0 X - Complex (extensive) Discharge Coordination 1 15 PROCESS - Special Needs []  - Pediatric / Minor Patient Management 0 []  - Isolation Patient Management 0 []  - Hearing / Language / Visual special needs 0 []  - Assessment of Community assistance (transportation, D/C planning, etc.) 0 []  - Additional assistance / Altered mentation 0 []  - Support Surface(s) Assessment (bed, cushion, seat, etc.)  0 INTERVENTIONS - Wound Cleansing / Measurement []  - Simple Wound Cleansing - one wound 0 X - Complex Wound Cleansing - multiple wounds 3 5 X - Wound Imaging (photographs - any  number of wounds) 1 5 []  - Wound Tracing (instead of photographs) 0 []  - Simple Wound Measurement - one wound 0 X - Complex Wound Measurement - multiple wounds 3 5 INTERVENTIONS - Wound Dressings []  - Small Wound Dressing one or multiple wounds 0 X - Medium Wound Dressing one or multiple wounds 2 15 []  - Large Wound Dressing one or multiple wounds 0 X - Application of Medications - topical 1 5 []  - Application of Medications - injection 0 INTERVENTIONS - Miscellaneous []  - External ear exam 0 []  - Specimen Collection (cultures, biopsies, blood, body fluids, etc.) 0 []  - Specimen(s) / Culture(s) sent or taken to Lab for analysis 0 []  - Patient Transfer (multiple staff / Civil Service fast streamer / Similar devices) 0 []  - Simple Staple / Suture removal (25 or less) 0 []  - Complex Staple / Suture removal (26 or more) 0 []  - Hypo / Hyperglycemic Management (close monitor of Blood Glucose) 0 []  - Ankle / Brachial Index (ABI) - do not check if billed separately 0 X - Vital Signs 1 5 Has the patient been seen at the hospital within the last three years: Yes Total Score: 180 Level Of Care: New/Established - Level 5 Electronic Signature(s) Signed: 11/22/2018 6:31:45 PM By: [name] [name] Entered By: [name] [name] on 11/22/2018 12:59:48 -------------------------------------------------------------------------------- Encounter Discharge Information Details Patient Name: Date of Service: [name] [name]. 11/22/2018 10:30 AM Medical Record [id number] Patient Account Number: [id number] Date of Birth/Sex: Treating RN: Mar 11, 1955 (63 y.o. [name] [name] Primary Care [name] [name]: SYSTEM, PCP Other Clinician: Referring [name] [name]: Treating [name] [name]/Extender:[name], [name] Fanny, [name] Plan in Treatment: 50 Encounter  Discharge Information Items Discharge Condition: Stable Ambulatory Status: Wheelchair Discharge Destination: Home Transportation: Private Auto Accompanied By: sister Schedule Follow-up Appointment: Yes Clinical Summary of Care: Patient Declined Electronic Signature(s) Signed: 11/30/2018 5:13:27 PM By: [name] [name] Entered By: [name] [name] on 11/22/2018 12:40:18 -------------------------------------------------------------------------------- Lower Extremity Assessment Details Patient Name: Date of Service: [name] [name], [name] [name] 11/22/2018 10:30 AM Medical Record [id number] Patient Account Number: [id number] Date of Birth/Sex: Treating RN: 1955/04/27 (63 y.o. [name] [name] Primary Care Masiah Woody: SYSTEM, PCP Other Clinician: Referring Mayerli Kirst: Treating Riata Ikeda/Extender:[name], [name] Fanny, [name] Plan in Treatment: 31 Electronic Signature(s) Signed: 11/30/2018 5:13:27 PM By: [name] [name] Entered By: [name] [name] on 11/22/2018 11:38:32 -------------------------------------------------------------------------------- Multi Wound Chart Details Patient Name: Date of Service: [name] [name]. 11/22/2018 10:30 AM Medical Record Number:[id number] Patient Account Number: [id number] Date of Birth/Sex: Treating RN: 11/14/1955 (63 y.o. [name] [name], [name] [name] Primary Care Latavious Bitter: SYSTEM, PCP Other Clinician: Referring Marveline Profeta: Treating Sarahgrace Broman/Extender:[name], [name] Fanny, [name] Plan in Treatment: 73 Vital Signs Height(in): 27 Pulse(bpm): 70 Weight(lbs): 260 Blood Pressure(mmHg): 134/55 Body Mass Index(BMI): 48 Temperature(F): 97.9 Respiratory 19 Rate(breaths/min): Photos: [3:No Photos] [4:No Photos] [6:No Photos] Wound Location: [3:Right Abdomen - Lower Quadrant] [4:Right Flank] [6:Left Flank] Wounding Event: [3:Gradually Appeared] [4:Gradually Appeared] [6:Gradually Appeared] Primary Etiology: [3:Dehisced Wound] [4:MASD Moisture  Associated Skin Damage] [6:Inflammatory] Comorbid History: [3:Cataracts, Lymphedema, Cataracts, Lymphedema, Cataracts, Lymphedema, Sleep Apnea, Hypertension, Sleep Apnea, Hypertension, Sleep Apnea, Hypertension, Type II Diabetes, End Stage Type II Diabetes, End Stage Type II Diabetes, End Stage  Renal Disease, Gout, Rheumatoid Arthritis, Neuropathy] [4:Renal Disease, Gout, Rheumatoid Arthritis, Neuropathy] [6:Renal Disease, Gout, Rheumatoid Arthritis, Neuropathy] Date Acquired: [3:12/07/2017] [4:05/31/2018] [6:11/05/2018] Weeks of Treatment: [3:49] [4:23] [6:2] Wound Status: [3:Open] [4:Open] [6:Open] Clustered Wound: [3:No] [4:Yes] [6:No] Clustered Quantity: [3:N/A] [4:3] [6:N/A] Measurements L x W x D 7x4.4x0.1 [4:1.7x10.2x0.3] [6:0x0x0] (cm) Area (cm) : [3:24.19] [4:13.619] [6:0] Volume (cm) : [3:2.419] [4:4.086] [6:0] %  Reduction in Area: [3:-273.30%] [4:-67.50%] [6:100.00%] % Reduction in Volume: 58.50% [4:-402.60%] [6:100.00%] Classification: [3:Full Thickness Without Exposed Support Structures] [4:N/A] [6:Full Thickness Without Exposed Support Structures] Exudate Amount: [3:Small] [4:Medium] [6:None Present] Exudate Type: [3:Serosanguineous] [4:Serosanguineous] [6:N/A] Exudate Color: [3:red, brown] [4:red, brown] [6:N/A] Wound Margin: [3:Distinct, outline attached Distinct, outline attached Flat and Intact] Granulation Amount: [3:Large (67-100%)] [4:Large (67-100%)] [6:None Present (0%)] Granulation Quality: [3:Red, Pink] [4:Pink, Pale] [6:N/A] Necrotic Amount: [3:Small (1-33%)] [4:Small (1-33%)] [6:None Present (0%)] Exposed Structures: [3:Fat Layer (Subcutaneous Fat Layer (Subcutaneous Fascia: No Tissue) Exposed: Yes Fascia: No Tendon: No Muscle: No Joint: No Bone: No Small (1-33%)] [4:Tissue) Exposed: Yes Fascia: No Tendon: No Muscle: No Joint: No Bone: No Small (1-33%)] [6:Fat Layer  (Subcutaneous Tissue) Exposed: No Tendon: No Muscle: No Joint: No Bone: No Large  (67-100%)] Treatment Notes Wound #3 (Right Abdomen - Lower Quadrant) 1. Cleanse With Wound Cleanser 2. Periwound Care Skin Prep 3. Primary Dressing Applied Collagen Hydrogel or K-Y Jelly 4. Secondary Dressing ABD Pad Dry Gauze 5. Secured With Tape Wound #4 (Right Flank) 1. Cleanse With Wound Cleanser 2. Periwound Care Antifungal cream Skin Prep 3. Primary Dressing Applied Calcium Alginate Ag 4. Secondary Dressing ABD Pad Dry Gauze 5. Secured With Recruitment consultant) Signed: 11/22/2018 6:13:48 PM By: Linton Ham MD Signed: 11/22/2018 6:31:45 PM By: [name] [name] Entered By: Linton Ham on 11/22/2018 13:17:55 -------------------------------------------------------------------------------- Multi-Disciplinary Care Plan Details Patient Name: Date of Service: [name] [name], [name] [name]. 11/22/2018 10:30 AM Medical Record Number:[id number] Patient Account Number: [id number] Date of Birth/Sex: Treating RN: Aug 02, 1955 (63 y.o. [name] Primary Care Myquan Schaumburg: SYSTEM, PCP Other Clinician: Referring Mallisa Alameda: Treating Kaytee Taliercio/Extender:[name], [name] Fanny, [name] Plan in Treatment: 73 Active Inactive Wound/Skin Impairment Nursing Diagnoses: Impaired tissue integrity Goals: Patient/caregiver will verbalize understanding of skin care regimen Date Initiated: 09/07/2017 Target Resolution Date: 12/21/2018 Goal Status: Active Ulcer/skin breakdown will have a volume reduction of 30% by week 4 Date Initiated: 06/29/2017 Date Inactivated: 08/04/2017 Target Resolution Date: 09/01/2017 Goal Status: Unmet Unmet Reason: larger Interventions: Assess patient/caregiver ability to obtain necessary supplies Assess patient/caregiver ability to perform ulcer/skin care regimen upon admission and as needed Assess ulceration(s) every visit Provide education on ulcer and skin care Notes: Electronic Signature(s) Signed: 11/22/2018 6:31:45 PM By: [name] [name] Entered By:  [name] [name] on 11/22/2018 11:56:25 -------------------------------------------------------------------------------- Pain Assessment Details Patient Name: Date of Service: [name] [name], [name] [name] 11/22/2018 10:30 AM Medical Record [id number] Patient Account Number: [id number] Date of Birth/Sex: Treating RN: [birth year] (63 y.o. [name] [name] Primary Care Evianna Chandran: SYSTEM, PCP Other Clinician: Referring Demorris Choyce: Treating Ahlana Slaydon/Extender:[name], [name] Fanny, [name] Plan in Treatment: 73 Active Problems Location of Pain Severity and Description of Pain Patient Has Paino No Site Locations Pain Management and Medication Current Pain Management: Electronic Signature(s) Signed: 11/30/2018 5:13:27 PM By: [name] [name] Entered By: [name] [name] on 11/22/2018 11:38:24 -------------------------------------------------------------------------------- Patient/Caregiver Education Details [name] [name] 10/1/2020andnbsp10:30 Patient Name: Date of Service: M. AM Medical Record Patient Account Number: [id number] Number: Treating RN: [name] [name] Date of Birth/Gender: September 15, 1955 (63 y.o. F) Other Clinician: Primary Care Physician: SYSTEM, PCP Treating Linton Ham Referring Physician: Physician/Extender: Dionisio Paschal in Treatment: 4 Education Assessment Education Provided To: Patient Education Topics Provided Wound/Skin Impairment: Handouts: Caring for Your Ulcer Methods: Explain/Verbal Responses: Reinforcements needed Electronic Signature(s) Signed: 11/22/2018 6:31:45 PM By: [name] [name] Entered By: [name] [name] on 11/22/2018 11:56:36 -------------------------------------------------------------------------------- Wound Assessment Details Patient Name: Date of Service: [name] [name], [name] [name] 11/22/2018 10:30 AM Medical Record Number:[id number] Patient Account Number: [id number] Date of Birth/Sex: Treating RN:  [birth year] (62 y.o.  [name] [name] Primary Care Attilio Zeitler: SYSTEM, PCP Other Clinician: Referring Estuardo Frisbee: Treating Farzana Koci/Extender:[name], [name] Fanny, [name] Plan in Treatment: 73 Wound Status Wound Number: 3 Primary Dehisced Wound Etiology: Wound Location: Right Abdomen - Lower Quadrant Wound Open Wounding Event: Gradually Appeared Status: Date Acquired: 12/07/2017 Comorbid Cataracts, Lymphedema, Sleep Apnea, Weeks Of Treatment: 49 History: Hypertension, Type II Diabetes, End Stage Clustered Wound: No Renal Disease, Gout, Rheumatoid Arthritis, Neuropathy Photos Wound Measurements Length: (cm) 7 % Reduct Width: (cm) 4.4 % Reduct Depth: (cm) 0.1 Epitheli Area: (cm) 24.19 Tunneli Volume: (cm) 2.419 Undermi Wound Description Classification: Full Thickness Without Exposed Support Foul Odo Structures Slough/F Wound Distinct, outline attached Margin: Exudate Small Amount: Exudate Serosanguineous Type: Exudate red, brown Color: Wound Bed Granulation Amount: Large (67-100%) Granulation Quality: Red, Pink Fascia E Necrotic Amount: Small (1-33%) Fat Laye Necrotic Quality: Adherent Slough Tendon E Muscle E Joint Ex Bone Exp r After Cleansing: No ibrino Yes Exposed Structure xposed: No r (Subcutaneous Tissue) Exposed: Yes xposed: No xposed: No posed: No osed: No ion in Area: -273.3% ion in Volume: 58.5% alization: Small (1-33%) ng: No ning: No Treatment Notes Wound #3 (Right Abdomen - Lower Quadrant) 1. Cleanse With Wound Cleanser 2. Periwound Care Skin Prep 3. Primary Dressing Applied Collagen Hydrogel or K-Y Jelly 4. Secondary Dressing ABD Pad Dry Gauze 5. Secured With Recruitment consultant) Signed: 11/23/2018 10:20:10 AM By: Mikeal Hawthorne EMT/HBOT Signed: 11/30/2018 5:13:27 PM By: [name] [name] Entered By: Mikeal Hawthorne on 11/23/2018 08:49:06 -------------------------------------------------------------------------------- Wound Assessment  Details Patient Name: Date of Service: [name] [name], [name] [name] 11/22/2018 10:30 AM Medical Record [id number] Patient Account Number: [id number] Date of Birth/Sex: Treating RN: 03-17-1955 (63 y.o. [name] [name] Primary Care Kayleb Warshaw: SYSTEM, PCP Other Clinician: Referring Daralyn Bert: Treating Ifeoma Vallin/Extender:[name], [name] Fanny, [name] Plan in Treatment: 73 Wound Status Wound Number: 4 Primary MASD Moisture Associated Skin Damage Etiology: Wound Location: Right Flank Wound Open Wounding Event: Gradually Appeared Status: Date Acquired: 05/31/2018 Comorbid Cataracts, Lymphedema, Sleep Apnea, Weeks Of Treatment: 23 History: Hypertension, Type II Diabetes, End Stage Clustered Wound: Yes Renal Disease, Gout, Rheumatoid Arthritis, Neuropathy Photos Wound Measurements Length: (cm) 1.7 % Reduction i Width: (cm) 10.2 % Reduction i Depth: (cm) 0.3 Epithelializa Clustered Quantity: 3 Tunneling: Area: (cm) 13.619 Undermining: Volume: (cm) 4.086 Wound Description Wound Margin: Distinct, outline attached Foul Odor Aft Exudate Amount: Medium Slough/Fibrin Exudate Type: Serosanguineous Exudate Color: red, brown Wound Bed Granulation Amount: Large (67-100%) Granulation Quality: Pink, Pale Fascia Expose Necrotic Amount: Small (1-33%) Fat Layer (Su Necrotic Quality: Adherent Slough Tendon Expose Muscle Expose Joint Exposed Bone Exposed: Treatment Notes Wound #4 (Right Flank) 1. Cleanse With Wound Cleanser 2. Periwound Care Antifungal cream Skin Prep 3. Primary Dressing Applied Calcium Alginate Ag 4. Secondary Dressing ABD Pad Dry Gauze 5. Secured With Tape er Cleansing: No o Yes Exposed Structure d: No bcutaneous Tissue) Exposed: Yes d: No d: No : No No n Area: -67.5% n Volume: -402.6% tion: Small (1-33%) No No Electronic Signature(s) Signed: 11/23/2018 10:20:10 AM By: Mikeal Hawthorne EMT/HBOT Signed: 11/30/2018 5:13:27 PM By: [name] [name] Entered By: Mikeal Hawthorne on 11/23/2018 08:49:27 -------------------------------------------------------------------------------- Wound Assessment Details Patient Name: Date of Service: [name] [name]. 11/22/2018 10:30 AM Medical Record [id number] Patient Account Number: [id number] Date of Birth/Sex: Treating RN: 1956-02-17 (63 y.o. [name] [name] Primary Care Aldean Suddeth: SYSTEM, PCP Other Clinician: Referring Odelia Graciano: Treating Danikah Budzik/Extender:[name], [name] Fanny, [name] Plan in Treatment: 73 Wound Status Wound Number: 6 Primary Inflammatory Etiology: Wound Location: Left Flank Wound  Healed - Epithelialized Wounding Event: Gradually Appeared Status: Date Acquired: 11/05/2018 Comorbid Cataracts, Lymphedema, Sleep Apnea, Weeks Of Treatment: 2 History: Hypertension, Type II Diabetes, End Stage Clustered Wound: No Renal Disease, Gout, Rheumatoid Arthritis, Neuropathy Photos Wound Measurements Length: (cm) 0 % Reduct Width: (cm) 0 % Reduct Depth: (cm) 0 Epitheli Area: (cm) 0 Tunneli Volume: (cm) 0 Undermi Wound Description Classification: Full Thickness Without Exposed Support Foul Odo Structures Slough/F Wound Flat and Intact Margin: Exudate None Present Amount: Wound Bed Granulation Amount: None Present (0%) Necrotic Amount: None Present (0%) Fascia E Fat Laye Tendon E Muscle E Joint Ex Bone Exp r After Cleansing: No ibrino No Exposed Structure xposed: No r (Subcutaneous Tissue) Exposed: No xposed: No xposed: No posed: No osed: No ion in Area: 100% ion in Volume: 100% alization: Large (67-100%) ng: No ning: No Electronic Signature(s) Signed: 11/23/2018 10:20:10 AM By: Mikeal Hawthorne EMT/HBOT Signed: 11/30/2018 5:13:27 PM By: [name] [name] Entered By: Mikeal Hawthorne on 11/23/2018 08:49:48 -------------------------------------------------------------------------------- Vitals Details Patient Name: Date of  Service: [name] [name]. 11/22/2018 10:30 AM Medical Record [id number] Patient Account Number: [id number] Date of Birth/Sex: Treating RN: November 22, 1955 (63 y.o. [name] [name] Primary Care Naina Sleeper: SYSTEM, PCP Other Clinician: Referring Erum Cercone: Treating Haeven Nickle/Extender:[name], [name] Fanny, [name] Plan in Treatment: 73 Vital Signs Time Taken: 11:35 Temperature (F): 97.9 Height (in): 62 Pulse (bpm): 70 Weight (lbs): 260 Respiratory Rate (breaths/min): 19 Body Mass Index (BMI): 47.5 Blood Pressure (mmHg): 134/55 Reference Range: 80 - 120 mg / dl Electronic Signature(s) Signed: 11/30/2018 5:13:27 PM By: [name] [name] Entered By: [name] [name] on 11/22/2018 11:38:16

## 2018-12-06 ENCOUNTER — Encounter (HOSPITAL_BASED_OUTPATIENT_CLINIC_OR_DEPARTMENT_OTHER): Payer: Medicare Other | Admitting: Internal Medicine

## 2018-12-06 ENCOUNTER — Other Ambulatory Visit: Payer: Self-pay

## 2018-12-06 DIAGNOSIS — T8131XA Disruption of external operation (surgical) wound, not elsewhere classified, initial encounter: Secondary | ICD-10-CM | POA: Diagnosis not present

## 2018-12-07 NOTE — Progress Notes (Signed)
[name] [name], [name] [name] ([id number]) Visit Report for 12/06/2018 HPI Details Patient Name: Date of Service: [name] [name], [name] [name] 12/06/2018 10:45 AM Medical Record Number:[id number] Patient Account Number: [id number] Date of Birth/Sex: Treating RN: Nov 13, 1955 (63 y.o. [name] Primary Care Provider: SYSTEM, PCP Other Clinician: Referring Provider: Treating Provider/Extender:Robson, Dulcy Fanny, Fuller Plan in Treatment: 2 History of Present Illness HPI Description: 06/29/17; this is a 63 year old woman who is a poor historian. It appears that she's had limited medical care over the last 2 years or so and what she had his through the [place]. She is here for review of a draining area on her right shoulder anteriorly as well as a nonhealing surgical wound in the left lower abdomen. Per her description she had a hysterectomy in 2006 4 "tumors". About 9 months later she had have another operation in the same area because of "tumors" involving her fallopian tubes and ovary. Sounds as though the surgical wound in this area that took a long time to heal. Apparently the area on her left lower abdomen is been open for the last 6 or 7 months. She is been applying topical antibiotics. 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 She had a fall in late 2018 and the open area on top of her shoulder. She is had draining pus coming out of this wound since she's been applying topical antibiotics and a Band-Aid. She is not complaining of a lot of pain or fever although she did have chills today. She saw her doctor at the [place] yesterday she doesn't know her name she apparently was referred to a surgeon at the [facility] and [place] presumably an orthopedic surgeon to look at that shoulder. She also has a primary doctor in [place] by the name of Dr. Jimmye Norman who referred her here after seeing her within the last month. 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 We don't have a lot of information in her in Epic. Last hospitalization was from 11/21/16 through 11/24/16 with hypoglycemia felt to be secondary to sulfonylureas. There is no mention of a wound at that time. 07/07/17; patient's shoulder cultured MSSA. I put her on clindamycin to had some anaerobic coverage with caution about diarrhea. I had asked her to make an orthopedic follow-up at the [place] with a doctor that made the original surgery but she has not done that yet. She also has a fairly large wound in the left lower abdomen in the middle of his surgical scar. This was apparently gynecologic surgery perhaps fibroid surgery. In any case these wounds are larger today. 07/21/17; the patient went to see the orthopedic doctor at the [facility] who did her original surgery surgery. Per the patient's description the draining area had closed down. He didn't think anything needed to be done there were no imaging studies. The lower abdominal wound in the left mid pelvis area also looks somewhat better to me. There is less surface material on this still some probing areas but not a lot of depth. We've been using silver alginate 08/03/17; the patient arrives back in 2 week follow-up. The draining sinus on her right shoulder has not reopened although she is complaining of widespread shoulder and other joint pain. She is using silver alginate to the remaining surgical wound on her abdomen. 08/18/17; the patient has 2 week follow-up appointment. The draining sinus on her right shoulder is not reopened.  She is complaining of left shoulder pain today. The presumed surgical wound on her lower abdomen looks quite a bit better and this is really contracted quite nicely. When she first came in the deep sinus she had on the anterior shoulder cultured MSSA. I gave her clindamycin. I arranged for orthopedic follow-up at the [facility] however  this is already closed nevertheless this was a deep probing tunnel and I think it needs to be watched for reopening. 09/07/17; surgical wound on her lower abdomen. This as 2 attached leaflets. The larger one is superiorly. A small probing area here with some sanguinous drainage. Surface needed debridement today. We've been using silver alginate however I changed this to silver collagen today 09/15/17. Surgical wound on her lower abdomen. No recent improvement although this is markedly improved from when we first started seeing her although the original problem was a wound on her right shoulder which is long since closed.switched her to Silver collagen last week She arrives in clinic today complaining of abdominal pain "so bad I almost went to the ER". She tells me she had an endoscopy and colonoscopy about a week ago. This was at the [place], I have no way to look at this. She is unaware of any biopsy was done 10/02/17 on evaluation today patient continues to do well in regard to her abdominal wound were using Prisma it's obviously taken some time for this to heal but nonetheless I do think that she is making good progress currently. There does not appear to be any evidence of infection at this time which is good news. She still does have some tunneling or undermining but again I think this is still doing well. 10/19/17; she continues to make good progress in this surgical abdominal wound using Prisma. This is come in nicely at one point had considerable depth. No evidence of infection. The patient is not complaining of pain in this area but is complaining of diabetic neuropathy pain in her right foot 11/02/17;the patient's wound is superficial and is really continued to make decent progress. I thought this would be more problematic when I first saw however this appears to a filled in nicely there is no depth. It exists in the crevice between what I think is lower abdominal hernias. She comes in  complaining of generalized pain however she is running out of her narcotics providedto her by a local pain clinic. She is asking for home health however she is [place] as I understand we cannot order this. 11/16/2017; 2-week follow-up. She has a lower abdominal surgical wound and this appears to be making nice progress closing in nicely each time we see it. This was initially a deep crevice surgical area between what I think is to lower abdominal hernias. In spite of this wound has healthy granulation. There does not appear to be any depth at all. There is probably a lot of tensile stress on the wound area however in spite of this things actually look better 11/30/2017; 2-week follow-up. She is complaining of left lower quadrant abdominal pain it is difficult to characterize this clearly episodic and not really associated with nausea or vomiting. She follows at the [place]. Tells me she was recently seen there by her new primary physician. The wound is in the lower abdomen in the middle of what I think was a gynecologic surgical site. We have made progress with this. 12/14/2017; 2-week follow-up. The patient comes in today with an improved wound area on the lower mid abdomen surgical  scar which was the wound we have been following her for. Paradoxically she has developed a new open area just to the right of the midline almost a mirror imaging wound on the right. She states this came up about a week ago. She does not have a clear explanation. Both of these wounds are in surgical scar areas although from her I think this surgery was remote. She denies scratching this or traumatizing it. It is surrounded by 2 large areas that I suspect are herniations but I still cannot see how this would have come from trauma. 12/28/2017; the area on the left surgical scar in the lower abdomen is better however the area on the right that we identified last time is worse. Increase in dimensions. There is no obvious infection  here. We have been using silver alginate. She tells me she has been to the ER at the [place] and they gave her doxycycline she also saw her primary doctor yesterday. I think she needs to be referred to dermatology consideration of a skin biopsy question panniculitis 01/11/2018; we continue to have improvement in the left lower mid abdominal wound however the area on the right that we identified in late October has gotten quite a bit worse. There is not appear to be surrounding infection although the patient is complaining of a lot of pain. We have been using silver alginate. [facility] I think this should help 01/24/18 on evaluation today patient actually apparently has two abdominal wounds the one on the right is new here and unfortunately seems to be doing a little bit worse and that it's a little bit larger. Fortunately there does not appear to be any sign of infection at this time. No fevers, chills, nausea, or vomiting noted at this time. Overall again it sounds like this is a fairly complex issue and despite the fact that we been using silver nitrate in the [facility] Dressing still has some hyper granulation. No fevers, chills, nausea, or vomiting noted at this time. 01/31/18 plan evaluation today patient appears to be doing better in general in regard to her wounds. She has been tolerating the dressing changes without complication. I do feel like that right now she's making some progress so she still is having some discomfort. 02/07/18 on evaluation today patient actually appears to be doing rather well in regard to her double ulcers as far as I'm concerned. With that being said she is somewhat frustrated in general in regard to the overall appearance of everything. I feel like this is unwarranted however since she seems to be making progress. 02/15/2018; lower mid abdomen right much greater than left although the left was the original  wound. The area on the left looks really quite good and is smaller. The area on the right seems longer and measures worse in terms of dimensions. These areas are in the most dependent part of her large abdominal pannus and this may be the entire issue. at [facility] on the left there is lymphedema around the wound. The patient is seeing dermatology next week apparently in [facility] 03/01/2017; she likely had abdomen. Right now he other than left ankle both he has better today. I think both of these areas are smaller. Surface looks healthier she is due to see dermatology later this month. Using [facility] 1/23; abdominal wounds in the lower mid abdomen. This is in a very dependent area in her lower abdominal pannus and the simple  tension in this area may have a lot to do with this. She also has constant chronic pruritus and scratching and I wonder if this has something to do with open wounds in this area. She tells me her daughter is helping her with the dressings currently and the original wound on the left side of her mid abdomen is better almost closed the larger area on the right also looks some better. She sees a dermatologist next week I have told her to ask the dermatologist for help with generalized pruritus. The probable skin wounds that she has all over secondary to scratching. The wounds we have been treating may be related to simple tension in a dependent lymphedematous area. She could benefit from an abdominal binder to help support this area 2/13; abdominal wounds in the lower mid abdomen. Very dependent in her abdomen and this may be secondary to tension in the lower part of her abdominal pannus plus or minus edema. 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 She claims not to be scratching at the wound areas. 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 They give her gentian violet to put on the wound area but this is already present and Hydrofera Blue. The original wound on the left is healed today. However the area on the right part of her mid abdominal pannus appears larger. We are going to try to work with her on getting an abdominal binder to help support the weight of the pannus around the wounds 3/9; abdominal wound in the lower mid abdomen. She apparently has an abdominal binder that she bought on [employer]. She is using Hydrofera Blue to the wound on the right of the midline. The original wound was on the left and is closed over. 3/26; abdominal wound in the lower mid abdomen. This is her second wound in this area. The lower part of her abdominal pannus surrounding surgical scar. Once again she is not wearing the abdominal binder she said she obtained. The dressing is Hydrofera Blue 4/16; abdomen in the right lower mid abdomen. This is the second wound in this area the other was a mirror image area on the left. I had her seen by dermatology at the [facility] this was not very helpful. She is not wearing her abdominal binder. We have been using Hydrofera Blue which is what healed at the other side. The abdominal wound is actually larger She arrives in clinic today complaining of the plantar right heel. Very tender. She has been pushing herself up in her bed with her heels and this may be a deep tissue injury. 4/23; abdomen wound is about the same. Spreading along the folds of her pannus somewhat but generally the surface looks somewhat better. She is not using her abdominal binder although she did bring it in this time She arrives in clinic complaining of pain and itching in the right lateral rib area. She has 3 open areas here which are circular wounds. These are new She also mentions her lower sacrum and there are 2 wounds in this area. These are new 5/7;  abdominal wound is about the same although in general it looks better than a few weeks ago at which time it had punched out depth. No debridement is required. She will not wear her abdominal binder  She has areas on her right lateral rib cage 3 open areas circular wounds these look about the same as last time. I am assuming these have something to do with the prurigo nodularis that she may have scratched but I am not certain. Finally she has a linear area on her lower sacrum which I am assuming is a pressure area 5/21-Patient returns at 2 weeks for abdominal wound, areas on the right lateral rib cage, and lower sacrum linear area which is a pressure sore. We have been using silver alginate to all these wounds. She has been asked to wear abdominal binder which she is not. she has 2 new wounds on right flank area and on her coccyx. she has a total of 6 wounds 6/9; I have been following this patient for a prolonged period of time for wounds on her lower mid abdomen which I think were secondary to severe lymphedema. She had an area on the left of the midline that eventually healed only to open on the right side. This is still open we have been using silver alginate. She will not use an abdominal binder. About a month ago she arrived here with open areas on the right lateral ribs. This is in the middle of a large amount of soft tissue/pannus fold. She has 4 open areas here at this time. Finally she has 2 open areas in her lower coccyx. We have been using silver alginate on all the wound areas 6/25; we are following this woman for wounds on her lower mid abdomen in the setting of a pannus and severe dependent lymphedema. More recently she has had areas in the folds on the right lateral rib area. Culture I did of this area last time was negative. Finally she has an area on the coccyx. We have been using silver alginate to all the wound areas 7/9; this is a patient with morbid obesity severe lymphedema in  her lower abdominal pannus/severe dependent abdominal lymphedema. She first came to this clinic with an area on the left lower central abdomen. This eventually healed over. About the same time as it did heal over she developed an area in close juxtaposition on the other side of the midline. We have been following this for some months when she was here last time I thought things were improving although she a lot arrives today with a larger wound. She will not use her external abdominal binder. Over the last several weeks she has had areas on her right lateral rib area. The exact etiology of this is unclear we have been using silver alginate. She arrives today with the entire area inflamed angry with loss of surface epithelium and marked pruritus. I think this is tinea/Candida. This could be secondary or the primary in the etiology of these expanding areas. 7/23; the area on the right lateral rib areas is quite a bit better. Skin that we treated with oral and topical antifungals looks a lot better around this area. Unfortunately the lower abdominal area actually measures larger. There is no drainage here really no surrounding erythema. 8/6-Patient arrives to the clinic at 3 weeks, the right lateral rib cage area has copious drainage but the wounds look a little better, the abdominal midline wound unfortunately is larger, she is not able to use the binder both wounds have a clean base 8/20. Since the patient was last here she was hospitalized from 813 through 818. This was predominantly because of hyperkalemia secondary to lisinopril and dehydration. Lisinopril was  discontinued at discharge. She was also felt to have mild cellulitis around several skin ulcers she was given antibiotics in the hospital but not discharged on antibiotics. Also noted that she has stage IV chronic kidney disease with a baseline creatinine at 1.7. 9/3 the patient still has a 2 wound areas lower mid abdominal pannus and the  right lateral ribs. We have been using polymen to the abdominal wound and silver alginate to the area on the folds on her right lateral ribs. She is complaining about pruritus around the areas on the ribs. 9/17; 2-week follow-up. The area on the mid lower abdomen better using PolyMem. She has 4 small areas in the pannus fold on the right lateral ribs and is a new area on the left lateral lower abdominal folds this week. 10/1; 2-week follow-up. She has been using PolyMem on the mid lower abdomen and really not much improvement. She has this for small areas in the pannus fold of the right lateral ribs. These are gradually I think improving. The area that was new last time on the left lower abdominal pannus has closed over. 10/15; 2-week follow-up. I changed to silver collagen on the mid lower abdomen wound which seems smaller today. She is still using silver alginate on the right lateral ribs and a pannus fold and this looks better today although she is complaining of itching in this area She has had an outbreak on her face which almost looks like cystic acne. She is going to see her primary doctor about this tomorrow Electronic Signature(s) Signed: 12/06/2018 5:35:18 PM By: Linton Ham MD Entered By: Linton Ham on 12/06/2018 11:12:05 -------------------------------------------------------------------------------- Physical Exam Details Patient Name: Date of Service: [name] [name], [name] [name] 12/06/2018 10:45 AM Medical Record [id number] Patient Account Number: [id number] Date of Birth/Sex: Treating RN: Nov 20, 1955 (63 y.o. [name] Primary Care Provider: SYSTEM, PCP Other Clinician: Referring Provider: Treating Provider/Extender:Robson, Dulcy Fanny, Fuller Plan in Treatment: 75 Constitutional Sitting or standing Blood Pressure is within target range for patient.. Pulse regular and within target range for patient.Marland Kitchen Respirations regular, non-labored and within target  range.. Temperature is normal and within the target range for the patient.Marland Kitchen Appears in no distress. Eyes Conjunctivae clear. No discharge.no icterus. Gastrointestinal (GI) Morbidly obese but soft. Integumentary (Hair, Skin) Multiple healed scars of prurigo nodularis previously reviewed by dermatology. She has what appears to be cystic acne on her face and chin. Notes Wound exam Midline abdomen is improved healthy looking surface. We have been using silver collagen no evidence of surrounding infection Right lateral ribs 3 areas all smaller look healthy. I am not really seeing the degree of intertrigo that was there previously Electronic Signature(s) Signed: 12/06/2018 5:35:18 PM By: Linton Ham MD Entered By: Linton Ham on 12/06/2018 11:13:18 -------------------------------------------------------------------------------- Physician Orders Details Patient Name: Date of Service: [name] [name], [name] [name] 12/06/2018 10:45 AM Medical Record [id number] Patient Account Number: [id number] Date of Birth/Sex: Treating RN: 10/04/1955 (63 y.o. [name] Primary Care Provider: SYSTEM, PCP Other Clinician: Referring Provider: Treating Provider/Extender:Robson, Dulcy Fanny, Fuller Plan in Treatment: 56 Verbal / Phone Orders: No Diagnosis Coding ICD-10 Coding Code Description Disruption of external operation (surgical) wound, not elsewhere classified, subsequent T81.31XD encounter Unspecified open wound of abdominal wall, left lower quadrant without penetration into peritoneal S31.104D cavity, subsequent encounter L98.498 Non-pressure chronic ulcer of skin of other sites with other specified severity I89.0 Lymphedema, not elsewhere classified B35.4 Tinea corporis Follow-up Appointments Return appointment in 3 weeks. Dressing Change Frequency Wound #3 Right Abdomen -  Lower Quadrant Other: - change dressing twice a week. Wound #4 Right Flank Other: - change  dressing twice a week. Skin Barriers/Peri-Wound Care Wound #3 Right Abdomen - Lower Quadrant Skin Prep Wound #4 Right Flank Skin Prep Antifungal cream - apply antifungal cream with dressing changes. Wound Cleansing May shower and wash wound with soap and water. Primary Wound Dressing Wound #3 Right Abdomen - Lower Quadrant Collagen - moisten with hydrogel Wound #4 Right Flank Calcium Alginate with Silver Secondary Dressing Wound #3 Right Abdomen - Lower Quadrant Dry Gauze ABD pad - secure with tape Wound #4 Right Flank ABD pad Drawtex Off-Loading Turn and reposition every 2 hours Other: - abdominal binder to support abdomen. Patient to float right heel while in chair or bed with pillow. Patient to keep pressure off right heel as much as possible. Roll up a face cloth or a pillowcase to separate all skin folds- under breast, abdomen, both flank areas. [facility] for wound care. - [facility] change once a week on mondays when patient comes to wound center on every other Thursdays. Home Health to change twice a week every other week Mondays and Thursdays when patient is not at wound center. Electronic Signature(s) Signed: 12/06/2018 5:35:18 PM By: Linton Ham MD Signed: 12/07/2018 6:00:55 PM By: Levan Hurst RN, BSN Entered By: Levan Hurst on 12/06/2018 11:03:58 -------------------------------------------------------------------------------- Problem List Details Patient Name: Date of Service: [name] [name], [name] [name] 12/06/2018 10:45 AM Medical Record [id number] Patient Account Number: [id number] Date of Birth/Sex: Treating RN: Apr 28, 1955 (63 y.o. [name] Primary Care Provider: SYSTEM, PCP Other Clinician: Referring Provider: Treating Provider/Extender:Robson, Dulcy Fanny, Fuller Plan in Treatment: 75 Active Problems ICD-10 Evaluated Encounter Code Description Active Date Today  Diagnosis T81.31XD Disruption of external operation (surgical) wound, not 06/29/2017 No Yes elsewhere classified, subsequent encounter S31.104D Unspecified open wound of abdominal wall, left lower 06/29/2017 No Yes quadrant without penetration into peritoneal cavity, subsequent encounter L98.498 Non-pressure chronic ulcer of skin of other sites with 06/14/2018 No Yes other specified severity I89.0 Lymphedema, not elsewhere classified 08/30/2018 No Yes B35.4 Tinea corporis 08/30/2018 No Yes Inactive Problems ICD-10 Code Description Active Date Inactive Date M71.011 Abscess of bursa, right shoulder 06/29/2017 06/29/2017 L89.616 Pressure-induced deep tissue damage of right heel 06/07/2018 06/07/2018 L89.150 Pressure ulcer of sacral region, unstageable 06/14/2018 06/14/2018 Resolved Problems Electronic Signature(s) Signed: 12/06/2018 5:35:18 PM By: Linton Ham MD Entered By: Linton Ham on 12/06/2018 11:09:43 -------------------------------------------------------------------------------- Progress Note Details Patient Name: Date of Service: [name] [name]. 12/06/2018 10:45 AM Medical Record [id number] Patient Account Number: [id number] Date of Birth/Sex: Treating RN: 1955-08-07 (63 y.o. [name] Primary Care Provider: SYSTEM, PCP Other Clinician: Referring Provider: Treating Provider/Extender:Robson, Dulcy Fanny, Fuller Plan in Treatment: 75 Subjective History of Present Illness (HPI) 06/29/17; this is a 63 year old woman who is a poor historian. It appears that she's had limited medical care over the last 2 years or so and what she had his through the [place]. She is here for review of a draining area on her right shoulder anteriorly as well as a nonhealing surgical wound in the left lower abdomen. Per her description she had a hysterectomy in 2006 4 "tumors". About 9 months later she had have another operation in the same area because of "tumors" involving her fallopian  tubes and ovary. Sounds as though the surgical wound in this area that took a long time to heal. Apparently the area on her left lower abdomen is been open for  the last 6 or 7 months. She is been applying topical antibiotics. 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 She had a fall in late 2018 and the open area on top of her shoulder. She is had draining pus coming out of this wound since she's been applying topical antibiotics and a Band-Aid. She is not complaining of a lot of pain or fever although she did have chills today. She saw her doctor at the [place] yesterday she doesn't know her name she apparently was referred to a surgeon at the [facility] and [place] presumably an orthopedic surgeon to look at that shoulder. She also has a primary doctor in [place] by the name of Dr. Jimmye Norman who referred her here after seeing her within the last month. 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 We don't have a lot of information in her in Epic. Last hospitalization was from 11/21/16 through 11/24/16 with hypoglycemia felt to be secondary to sulfonylureas. There is no mention of a wound at that time. 07/07/17; patient's shoulder cultured MSSA. I put her on clindamycin to had some anaerobic coverage with caution about diarrhea. I had asked her to make an orthopedic follow-up at the [place] with a doctor that made the original surgery but she has not done that yet. ooShe also has a fairly large wound in the left lower abdomen in the middle of his surgical scar. This was apparently gynecologic surgery perhaps fibroid surgery. In any case these wounds are larger today. 07/21/17; the patient went to see the orthopedic doctor at the [facility] who did her original surgery surgery. Per the patient's description the draining area had closed down. He didn't think anything needed to be  done there were no imaging studies. ooThe lower abdominal wound in the left mid pelvis area also looks somewhat better to me. There is less surface material on this still some probing areas but not a lot of depth. We've been using silver alginate 08/03/17; the patient arrives back in 2 week follow-up. The draining sinus on her right shoulder has not reopened although she is complaining of widespread shoulder and other joint pain. She is using silver alginate to the remaining surgical wound on her abdomen. 08/18/17; the patient has 2 week follow-up appointment. The draining sinus on her right shoulder is not reopened. She is complaining of left shoulder pain today. The presumed surgical wound on her lower abdomen looks quite a bit better and this is really contracted quite nicely. When she first came in the deep sinus she had on the anterior shoulder cultured MSSA. I gave her clindamycin. I arranged for orthopedic follow-up at the [facility] however this is already closed nevertheless this was a deep probing tunnel and I think it needs to be watched for reopening. 09/07/17; surgical wound on her lower abdomen. This as 2 attached leaflets. The larger one is superiorly. A small probing area here with some sanguinous drainage. Surface needed debridement today. We've been using silver alginate however I changed this to silver collagen today 09/15/17. Surgical wound on her lower abdomen. No recent improvement although this is markedly improved from when we first started seeing her although the original problem was a wound on her right shoulder which is long since closed.switched her to Silver collagen last week She arrives in clinic today complaining of abdominal pain "so bad I almost went to the ER". She tells  me she had an endoscopy and colonoscopy about a week ago. This was at the [place], I have no way to look at this. She is unaware of any biopsy was done 10/02/17 on evaluation today patient continues to do well  in regard to her abdominal wound were using Prisma it's obviously taken some time for this to heal but nonetheless I do think that she is making good progress currently. There does not appear to be any evidence of infection at this time which is good news. She still does have some tunneling or undermining but again I think this is still doing well. 10/19/17; she continues to make good progress in this surgical abdominal wound using Prisma. This is come in nicely at one point had considerable depth. No evidence of infection. The patient is not complaining of pain in this area but is complaining of diabetic neuropathy pain in her right foot 11/02/17;the patient's wound is superficial and is really continued to make decent progress. I thought this would be more problematic when I first saw however this appears to a filled in nicely there is no depth. It exists in the crevice between what I think is lower abdominal hernias. She comes in complaining of generalized pain however she is running out of her narcotics providedto her by a local pain clinic. She is asking for home health however she is [place] as I understand we cannot order this. 11/16/2017; 2-week follow-up. She has a lower abdominal surgical wound and this appears to be making nice progress closing in nicely each time we see it. This was initially a deep crevice surgical area between what I think is to lower abdominal hernias. In spite of this wound has healthy granulation. There does not appear to be any depth at all. There is probably a lot of tensile stress on the wound area however in spite of this things actually look better 11/30/2017; 2-week follow-up. She is complaining of left lower quadrant abdominal pain it is difficult to characterize this clearly episodic and not really associated with nausea or vomiting. She follows at the [place]. Tells me she was recently seen there by her new primary physician. The wound is in the lower abdomen in the  middle of what I think was a gynecologic surgical site. We have made progress with this. 12/14/2017; 2-week follow-up. The patient comes in today with an improved wound area on the lower mid abdomen surgical scar which was the wound we have been following her for. Paradoxically she has developed a new open area just to the right of the midline almost a mirror imaging wound on the right. She states this came up about a week ago. She does not have a clear explanation. Both of these wounds are in surgical scar areas although from her I think this surgery was remote. She denies scratching this or traumatizing it. It is surrounded by 2 large areas that I suspect are herniations but I still cannot see how this would have come from trauma. 12/28/2017; the area on the left surgical scar in the lower abdomen is better however the area on the right that we identified last time is worse. Increase in dimensions. There is no obvious infection here. We have been using silver alginate. She tells me she has been to the ER at the [place] and they gave her doxycycline she also saw her primary doctor yesterday. I think she needs to be referred to dermatology consideration of a skin biopsy question panniculitis 01/11/2018; we continue  to have improvement in the left lower mid abdominal wound however the area on the right that we identified in late October has gotten quite a bit worse. There is not appear to be surrounding infection although the patient is complaining of a lot of pain. We have been using silver alginate. [facility] I think this should help 01/24/18 on evaluation today patient actually apparently has two abdominal wounds the one on the right is new here and unfortunately seems to be doing a little bit worse and that it's a little bit larger. Fortunately there does not appear to be any sign of infection at this time. No fevers, chills, nausea, or vomiting  noted at this time. Overall again it sounds like this is a fairly complex issue and despite the fact that we been using silver nitrate in the [facility] Dressing still has some hyper granulation. No fevers, chills, nausea, or vomiting noted at this time. 01/31/18 plan evaluation today patient appears to be doing better in general in regard to her wounds. She has been tolerating the dressing changes without complication. I do feel like that right now she's making some progress so she still is having some discomfort. 02/07/18 on evaluation today patient actually appears to be doing rather well in regard to her double ulcers as far as I'm concerned. With that being said she is somewhat frustrated in general in regard to the overall appearance of everything. I feel like this is unwarranted however since she seems to be making progress. 02/15/2018; lower mid abdomen right much greater than left although the left was the original wound. The area on the left looks really quite good and is smaller. The area on the right seems longer and measures worse in terms of dimensions. These areas are in the most dependent part of her large abdominal pannus and this may be the entire issue. at [facility] on the left there is lymphedema around the wound. The patient is seeing dermatology next week apparently in [facility] 03/01/2017; she likely had abdomen. Right now he other than left ankle both he has better today. I think both of these areas are smaller. Surface looks healthier she is due to see dermatology later this month. Using [facility] 1/23; abdominal wounds in the lower mid abdomen. This is in a very dependent area in her lower abdominal pannus and the simple tension in this area may have a lot to do with this. She also has constant chronic pruritus and scratching and I wonder if this has something to do with open wounds in this area. She tells me her daughter is helping her with the dressings currently  and the original wound on the left side of her mid abdomen is better almost closed the larger area on the right also looks some better. She sees a dermatologist next week I have told her to ask the dermatologist for help with generalized pruritus. The probable skin wounds that she has all over secondary to scratching. The wounds we have been treating may be related to simple tension in a dependent lymphedematous area. She could benefit from an abdominal binder to help support this area 2/13; abdominal wounds in the lower mid abdomen. Very dependent in her abdomen and this may be secondary to tension in the lower part of her abdominal pannus plus or minus edema. 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 She claims not to  be scratching at the wound areas. 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 They give her gentian violet to put on the wound area but this is already present and Hydrofera Blue. The original wound on the left is healed today. However the area on the right part of her mid abdominal pannus appears larger. We are going to try to work with her on getting an abdominal binder to help support the weight of the pannus around the wounds 3/9; abdominal wound in the lower mid abdomen. She apparently has an abdominal binder that she bought on [employer]. She is using Hydrofera Blue to the wound on the right of the midline. The original wound was on the left and is closed over. 3/26; abdominal wound in the lower mid abdomen. This is her second wound in this area. The lower part of her abdominal pannus surrounding surgical scar. Once again she is not wearing the abdominal binder she said she obtained. The dressing is Hydrofera Blue 4/16; abdomen in the right lower mid abdomen. This  is the second wound in this area the other was a mirror image area on the left. I had her seen by dermatology at the [facility] this was not very helpful. She is not wearing her abdominal binder. We have been using Hydrofera Blue which is what healed at the other side. The abdominal wound is actually larger She arrives in clinic today complaining of the plantar right heel. Very tender. She has been pushing herself up in her bed with her heels and this may be a deep tissue injury. 4/23; abdomen wound is about the same. Spreading along the folds of her pannus somewhat but generally the surface looks somewhat better. She is not using her abdominal binder although she did bring it in this time ooShe arrives in clinic complaining of pain and itching in the right lateral rib area. She has 3 open areas here which are circular wounds. These are new ooShe also mentions her lower sacrum and there are 2 wounds in this area. These are new 5/7; abdominal wound is about the same although in general it looks better than a few weeks ago at which time it had punched out depth. No debridement is required. She will not wear her abdominal binder ooShe has areas on her right lateral rib cage 3 open areas circular wounds these look about the same as last time. I am assuming these have something to do with the prurigo nodularis that she may have scratched but I am not certain. Finally she has a linear area on her lower sacrum which I am assuming is a pressure area 5/21-Patient returns at 2 weeks for abdominal wound, areas on the right lateral rib cage, and lower sacrum linear area which is a pressure sore. We have been using silver alginate to all these wounds. She has been asked to wear abdominal binder which she is not. she has 2 new wounds on right flank area and on her coccyx. she has a total of 6 wounds 6/9; I have been following this patient for a prolonged period of time for wounds on her lower mid abdomen which  I think were secondary to severe lymphedema. She had an area on the left of the midline that eventually healed only to open on the right side. This is still open we have been  using silver alginate. She will not use an abdominal binder. About a month ago she arrived here with open areas on the right lateral ribs. This is in the middle of a large amount of soft tissue/pannus fold. She has 4 open areas here at this time. Finally she has 2 open areas in her lower coccyx. We have been using silver alginate on all the wound areas 6/25; we are following this woman for wounds on her lower mid abdomen in the setting of a pannus and severe dependent lymphedema. More recently she has had areas in the folds on the right lateral rib area. Culture I did of this area last time was negative. Finally she has an area on the coccyx. We have been using silver alginate to all the wound areas 7/9; this is a patient with morbid obesity severe lymphedema in her lower abdominal pannus/severe dependent abdominal lymphedema. She first came to this clinic with an area on the left lower central abdomen. This eventually healed over. About the same time as it did heal over she developed an area in close juxtaposition on the other side of the midline. We have been following this for some months when she was here last time I thought things were improving although she a lot arrives today with a larger wound. She will not use her external abdominal binder. Over the last several weeks she has had areas on her right lateral rib area. The exact etiology of this is unclear we have been using silver alginate. She arrives today with the entire area inflamed angry with loss of surface epithelium and marked pruritus. I think this is tinea/Candida. This could be secondary or the primary in the etiology of these expanding areas. 7/23; the area on the right lateral rib areas is quite a bit better. Skin that we treated with oral and topical  antifungals looks a lot better around this area. Unfortunately the lower abdominal area actually measures larger. There is no drainage here really no surrounding erythema. 8/6-Patient arrives to the clinic at 3 weeks, the right lateral rib cage area has copious drainage but the wounds look a little better, the abdominal midline wound unfortunately is larger, she is not able to use the binder both wounds have a clean base 8/20. Since the patient was last here she was hospitalized from 813 through 818. This was predominantly because of hyperkalemia secondary to lisinopril and dehydration. Lisinopril was discontinued at discharge. She was also felt to have mild cellulitis around several skin ulcers she was given antibiotics in the hospital but not discharged on antibiotics. Also noted that she has stage IV chronic kidney disease with a baseline creatinine at 1.7. 9/3 the patient still has a 2 wound areas lower mid abdominal pannus and the right lateral ribs. We have been using polymen to the abdominal wound and silver alginate to the area on the folds on her right lateral ribs. She is complaining about pruritus around the areas on the ribs. 9/17; 2-week follow-up. The area on the mid lower abdomen better using PolyMem. She has 4 small areas in the pannus fold on the right lateral ribs and is a new area on the left lateral lower abdominal folds this week. 10/1; 2-week follow-up. She has been using PolyMem on the mid lower abdomen and really not much improvement. She has this for small areas in the pannus fold of the right lateral ribs. These are gradually I think improving. The area that was new last time on the  left lower abdominal pannus has closed over. 10/15; 2-week follow-up. I changed to silver collagen on the mid lower abdomen wound which seems smaller today. She is still using silver alginate on the right lateral ribs and a pannus fold and this looks better today although she is complaining  of itching in this area She has had an outbreak on her face which almost looks like cystic acne. She is going to see her primary doctor about this tomorrow Objective Constitutional Sitting or standing Blood Pressure is within target range for patient.. Pulse regular and within target range for patient.Marland Kitchen Respirations regular, non-labored and within target range.. Temperature is normal and within the target range for the patient.Marland Kitchen Appears in no distress. Vitals Time Taken: 10:34 AM, Height: 62 in, Weight: 260 lbs, BMI: 47.5, Temperature: 98.3 F, Pulse: 66 bpm, Respiratory Rate: 18 breaths/min, Blood Pressure: 132/48 mmHg. Eyes Conjunctivae clear. No discharge.no icterus. Gastrointestinal (GI) Morbidly obese but soft. General Notes: Wound exam ooMidline abdomen is improved healthy looking surface. We have been using silver collagen no evidence of surrounding infection ooRight lateral ribs 3 areas all smaller look healthy. I am not really seeing the degree of intertrigo that was there previously Integumentary (Hair, Skin) Multiple healed scars of prurigo nodularis previously reviewed by dermatology. She has what appears to be cystic acne on her face and chin. Wound #3 status is Open. Original cause of wound was Gradually Appeared. The wound is located on the Right Abdomen - Lower Quadrant. The wound measures 6.7cm length x 3.7cm width x 0.1cm depth; 19.47cm^2 area and 1.947cm^3 volume. There is Fat Layer (Subcutaneous Tissue) Exposed exposed. There is no tunneling or undermining noted. There is a medium amount of serosanguineous drainage noted. The wound margin is distinct with the outline attached to the wound base. There is large (67-100%) red, pink granulation within the wound bed. There is a small (1-33%) amount of necrotic tissue within the wound bed including Adherent Slough. Wound #4 status is Open. Original cause of wound was Gradually Appeared. The wound is located on the  Right Flank. The wound measures 1.5cm length x 9.5cm width x 0.2cm depth; 11.192cm^2 area and 2.238cm^3 volume. There is Fat Layer (Subcutaneous Tissue) Exposed exposed. There is no tunneling or undermining noted. There is a medium amount of serosanguineous drainage noted. The wound margin is distinct with the outline attached to the wound base. There is large (67-100%) red, pink granulation within the wound bed. There is no necrotic tissue within the wound bed. Assessment Active Problems ICD-10 Disruption of external operation (surgical) wound, not elsewhere classified, subsequent encounter Unspecified open wound of abdominal wall, left lower quadrant without penetration into peritoneal cavity, subsequent encounter Non-pressure chronic ulcer of skin of other sites with other specified severity Lymphedema, not elsewhere classified Tinea corporis Plan Follow-up Appointments: Return appointment in 3 weeks. Dressing Change Frequency: Wound #3 Right Abdomen - Lower Quadrant: Other: - change dressing twice a week. Wound #4 Right Flank: Other: - change dressing twice a week. Skin Barriers/Peri-Wound Care: Wound #3 Right Abdomen - Lower Quadrant: Skin Prep Wound #4 Right Flank: Skin Prep Antifungal cream - apply antifungal cream with dressing changes. Wound Cleansing: May shower and wash wound with soap and water. Primary Wound Dressing: Wound #3 Right Abdomen - Lower Quadrant: Collagen - moisten with hydrogel Wound #4 Right Flank: Calcium Alginate with Silver Secondary Dressing: Wound #3 Right Abdomen - Lower Quadrant: Dry Gauze ABD pad - secure with tape Wound #4 Right Flank: ABD pad Drawtex Off-Loading:  Turn and reposition every 2 hours Other: - abdominal binder to support abdomen. Patient to float right heel while in chair or bed with pillow. Patient to keep pressure off right heel as much as possible. Roll up a face cloth or a pillowcase to separate all skin folds- under  breast, abdomen, both flank areas. Home Health: [facility] for [facility]. - [facility] change once a week on mondays when patient comes to wound center on every other Thursdays. Home Health to change twice a week every other week Mondays and Thursdays when patient is not at wound center. 1. Continue with silver collagen in the lower abdomen 2. Continue with silver alginate to the right lateral rib area/flank. I have cautioned her to separate the folds in this area Electronic Signature(s) Signed: 12/06/2018 5:35:18 PM By: Linton Ham MD Entered By: Linton Ham on 12/06/2018 11:14:09 -------------------------------------------------------------------------------- SuperBill Details Patient Name: Date of Service: [name] [name] 12/06/2018 Medical Record Number:[id number] Patient Account Number: [id number] Date of Birth/Sex: Treating RN: [birth year] (63 y.o. [name] Primary Care Provider: SYSTEM, PCP Other Clinician: Referring Provider: Treating Provider/Extender:Robson, Dulcy Fanny, Fuller Plan in Treatment: 75 Diagnosis Coding ICD-10 Codes Code Description Disruption of external operation (surgical) wound, not elsewhere classified, subsequent T81.31XD encounter Unspecified open wound of abdominal wall, left lower quadrant without penetration into peritoneal S31.104D cavity, subsequent encounter L98.498 Non-pressure chronic ulcer of skin of other sites with other specified severity I89.0 Lymphedema, not elsewhere classified B35.4 Tinea corporis Facility Procedures CPT4 Code: [id number] Description: 99214 - WOUND CARE VISIT-LEV 4 EST PT Modifier: Quantity: 1 Physician Procedures CPT4: Description Modifier Quantity Code [id number] - WC PHYS LEVEL 3 - EST PT 1 ICD-10 Diagnosis Description S31.104D Unspecified open wound of abdominal wall, left lower quadrant without penetration into peritoneal cavity, subsequent  encounter  L98.498 Non-pressure chronic ulcer of skin of other sites with other specified severity Electronic Signature(s) Signed: 12/06/2018 5:35:18 PM By: Linton Ham MD Signed: 12/07/2018 6:00:55 PM By: Levan Hurst RN, BSN Entered By: Levan Hurst on 12/06/2018 12:09:23

## 2018-12-19 ENCOUNTER — Other Ambulatory Visit (HOSPITAL_BASED_OUTPATIENT_CLINIC_OR_DEPARTMENT_OTHER): Payer: Self-pay | Admitting: Internal Medicine

## 2018-12-27 ENCOUNTER — Other Ambulatory Visit: Payer: Self-pay

## 2018-12-27 ENCOUNTER — Encounter (HOSPITAL_BASED_OUTPATIENT_CLINIC_OR_DEPARTMENT_OTHER): Payer: Medicare Other | Attending: Internal Medicine | Admitting: Internal Medicine

## 2018-12-27 DIAGNOSIS — L98492 Non-pressure chronic ulcer of skin of other sites with fat layer exposed: Secondary | ICD-10-CM | POA: Diagnosis not present

## 2018-12-27 DIAGNOSIS — I890 Lymphedema, not elsewhere classified: Secondary | ICD-10-CM | POA: Diagnosis not present

## 2018-12-27 DIAGNOSIS — L299 Pruritus, unspecified: Secondary | ICD-10-CM | POA: Diagnosis not present

## 2018-12-27 DIAGNOSIS — B354 Tinea corporis: Secondary | ICD-10-CM | POA: Insufficient documentation

## 2018-12-27 DIAGNOSIS — Y838 Other surgical procedures as the cause of abnormal reaction of the patient, or of later complication, without mention of misadventure at the time of the procedure: Secondary | ICD-10-CM | POA: Insufficient documentation

## 2018-12-27 DIAGNOSIS — T8131XA Disruption of external operation (surgical) wound, not elsewhere classified, initial encounter: Secondary | ICD-10-CM | POA: Insufficient documentation

## 2018-12-27 NOTE — Progress Notes (Signed)
DASHANAE, LONGFIELD ([id number]) Visit Report for 12/27/2018 HPI Details Patient Name: Date of Service: [name] [name], [name] [name] 12/27/2018 10:45 AM Medical Record Number:[id number] Patient Account Number: [id number] Date of Birth/Sex: Treating RN: 1955-11-22 (63 y.o. [name] Primary Care Provider: SYSTEM, PCP Other Clinician: Referring Provider: Treating Provider/Extender:, Dulcy Fanny, Fuller Plan in Treatment: 78 History of Present Illness HPI Description: 06/29/17; this is a 63 year old woman who is a poor historian. It appears that she's had limited medical care over the last 2 years or so and what she had his through the [place]. She is here for review of a draining area on her right shoulder anteriorly as well as a nonhealing surgical wound in the left lower abdomen. Per her description she had a hysterectomy in 2006 4 "tumors". About 9 months later she had have another operation in the same area because of "tumors" involving her fallopian tubes and ovary. Sounds as though the surgical wound in this area that took a long time to heal. Apparently the area on her left lower abdomen is been open for the last 6 or 7 months. She is been applying topical antibiotics. 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 She had a fall in late 2018 and the open area on top of her shoulder. She is had draining pus coming out of this wound since she's been applying topical antibiotics and a Band-Aid. She is not complaining of a lot of pain or fever although she did have chills today. She saw her doctor at the [place] yesterday she doesn't know her name she apparently was referred to a surgeon at the [facility] and [place] presumably an orthopedic surgeon to look at that shoulder. She also has a primary doctor in [place] by the name of Dr. Jimmye Norman who referred her here after seeing her within the last month. 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 We don't have a lot of information in her in Epic. Last hospitalization was from 11/21/16 through 11/24/16 with hypoglycemia felt to be secondary to sulfonylureas. There is no mention of a wound at that time. 07/07/17; patient's shoulder cultured MSSA. I put her on clindamycin to had some anaerobic coverage with caution about diarrhea. I had asked her to make an orthopedic follow-up at the [place] with a doctor that made the original surgery but she has not done that yet. She also has a fairly large wound in the left lower abdomen in the middle of his surgical scar. This was apparently gynecologic surgery perhaps fibroid surgery. In any case these wounds are larger today. 07/21/17; the patient went to see the orthopedic doctor at the [facility] who did her original surgery surgery. Per the patient's description the draining area had closed down. He didn't think anything needed to be done there were no imaging studies. The lower abdominal wound in the left mid pelvis area also looks somewhat better to me. There is less surface material on this still some probing areas but not a lot of depth. We've been using silver alginate 08/03/17; the patient arrives back in 2 week follow-up. The draining sinus on her right shoulder has not reopened although she is complaining of widespread shoulder and other joint pain. She is using silver alginate to the remaining surgical wound on her abdomen. 08/18/17; the patient has 2 week follow-up appointment. The draining sinus on her right shoulder is not reopened.  She is complaining of left shoulder pain today. The presumed surgical wound on her lower abdomen looks quite a bit better and this is really contracted quite nicely. When she first came in the deep sinus she had on the anterior shoulder cultured MSSA. I gave her clindamycin. I arranged for orthopedic follow-up at the [facility] however  this is already closed nevertheless this was a deep probing tunnel and I think it needs to be watched for reopening. 09/07/17; surgical wound on her lower abdomen. This as 2 attached leaflets. The larger one is superiorly. A small probing area here with some sanguinous drainage. Surface needed debridement today. We've been using silver alginate however I changed this to silver collagen today 09/15/17. Surgical wound on her lower abdomen. No recent improvement although this is markedly improved from when we first started seeing her although the original problem was a wound on her right shoulder which is long since closed.switched her to Silver collagen last week She arrives in clinic today complaining of abdominal pain "so bad I almost went to the ER". She tells me she had an endoscopy and colonoscopy about a week ago. This was at the [place], I have no way to look at this. She is unaware of any biopsy was done 10/02/17 on evaluation today patient continues to do well in regard to her abdominal wound were using Prisma it's obviously taken some time for this to heal but nonetheless I do think that she is making good progress currently. There does not appear to be any evidence of infection at this time which is good news. She still does have some tunneling or undermining but again I think this is still doing well. 10/19/17; she continues to make good progress in this surgical abdominal wound using Prisma. This is come in nicely at one point had considerable depth. No evidence of infection. The patient is not complaining of pain in this area but is complaining of diabetic neuropathy pain in her right foot 11/02/17;the patient's wound is superficial and is really continued to make decent progress. I thought this would be more problematic when I first saw however this appears to a filled in nicely there is no depth. It exists in the crevice between what I think is lower abdominal hernias. She comes in  complaining of generalized pain however she is running out of her narcotics providedto her by a local pain clinic. She is asking for home health however she is [place] as I understand we cannot order this. 11/16/2017; 2-week follow-up. She has a lower abdominal surgical wound and this appears to be making nice progress closing in nicely each time we see it. This was initially a deep crevice surgical area between what I think is to lower abdominal hernias. In spite of this wound has healthy granulation. There does not appear to be any depth at all. There is probably a lot of tensile stress on the wound area however in spite of this things actually look better 11/30/2017; 2-week follow-up. She is complaining of left lower quadrant abdominal pain it is difficult to characterize this clearly episodic and not really associated with nausea or vomiting. She follows at the [place]. Tells me she was recently seen there by her new primary physician. The wound is in the lower abdomen in the middle of what I think was a gynecologic surgical site. We have made progress with this. 12/14/2017; 2-week follow-up. The patient comes in today with an improved wound area on the lower mid abdomen surgical  scar which was the wound we have been following her for. Paradoxically she has developed a new open area just to the right of the midline almost a mirror imaging wound on the right. She states this came up about a week ago. She does not have a clear explanation. Both of these wounds are in surgical scar areas although from her I think this surgery was remote. She denies scratching this or traumatizing it. It is surrounded by 2 large areas that I suspect are herniations but I still cannot see how this would have come from trauma. 12/28/2017; the area on the left surgical scar in the lower abdomen is better however the area on the right that we identified last time is worse. Increase in dimensions. There is no obvious infection  here. We have been using silver alginate. She tells me she has been to the ER at the [place] and they gave her doxycycline she also saw her primary doctor yesterday. I think she needs to be referred to dermatology consideration of a skin biopsy question panniculitis 01/11/2018; we continue to have improvement in the left lower mid abdominal wound however the area on the right that we identified in late October has gotten quite a bit worse. There is not appear to be surrounding infection although the patient is complaining of a lot of pain. We have been using silver alginate. [facility] I think this should help 01/24/18 on evaluation today patient actually apparently has two abdominal wounds the one on the right is new here and unfortunately seems to be doing a little bit worse and that it's a little bit larger. Fortunately there does not appear to be any sign of infection at this time. No fevers, chills, nausea, or vomiting noted at this time. Overall again it sounds like this is a fairly complex issue and despite the fact that we been using silver nitrate in the [facility] Dressing still has some hyper granulation. No fevers, chills, nausea, or vomiting noted at this time. 01/31/18 plan evaluation today patient appears to be doing better in general in regard to her wounds. She has been tolerating the dressing changes without complication. I do feel like that right now she's making some progress so she still is having some discomfort. 02/07/18 on evaluation today patient actually appears to be doing rather well in regard to her double ulcers as far as I'm concerned. With that being said she is somewhat frustrated in general in regard to the overall appearance of everything. I feel like this is unwarranted however since she seems to be making progress. 02/15/2018; lower mid abdomen right much greater than left although the left was the original  wound. The area on the left looks really quite good and is smaller. The area on the right seems longer and measures worse in terms of dimensions. These areas are in the most dependent part of her large abdominal pannus and this may be the entire issue. at [facility] on the left there is lymphedema around the wound. The patient is seeing dermatology next week apparently in [facility] 03/01/2017; she likely had abdomen. Right now he other than left ankle both he has better today. I think both of these areas are smaller. Surface looks healthier she is due to see dermatology later this month. Using [facility] 1/23; abdominal wounds in the lower mid abdomen. This is in a very dependent area in her lower abdominal pannus and the simple  tension in this area may have a lot to do with this. She also has constant chronic pruritus and scratching and I wonder if this has something to do with open wounds in this area. She tells me her daughter is helping her with the dressings currently and the original wound on the left side of her mid abdomen is better almost closed the larger area on the right also looks some better. She sees a dermatologist next week I have told her to ask the dermatologist for help with generalized pruritus. The probable skin wounds that she has all over secondary to scratching. The wounds we have been treating may be related to simple tension in a dependent lymphedematous area. She could benefit from an abdominal binder to help support this area 2/13; abdominal wounds in the lower mid abdomen. Very dependent in her abdomen and this may be secondary to tension in the lower part of her abdominal pannus plus or minus edema. 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 She claims not to be scratching at the wound areas. 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 They give her gentian violet to put on the wound area but this is already present and Hydrofera Blue. The original wound on the left is healed today. However the area on the right part of her mid abdominal pannus appears larger. We are going to try to work with her on getting an abdominal binder to help support the weight of the pannus around the wounds 3/9; abdominal wound in the lower mid abdomen. She apparently has an abdominal binder that she bought on [employer]. She is using Hydrofera Blue to the wound on the right of the midline. The original wound was on the left and is closed over. 3/26; abdominal wound in the lower mid abdomen. This is her second wound in this area. The lower part of her abdominal pannus surrounding surgical scar. Once again she is not wearing the abdominal binder she said she obtained. The dressing is Hydrofera Blue 4/16; abdomen in the right lower mid abdomen. This is the second wound in this area the other was a mirror image area on the left. I had her seen by dermatology at the [facility] this was not very helpful. She is not wearing her abdominal binder. We have been using Hydrofera Blue which is what healed at the other side. The abdominal wound is actually larger She arrives in clinic today complaining of the plantar right heel. Very tender. She has been pushing herself up in her bed with her heels and this may be a deep tissue injury. 4/23; abdomen wound is about the same. Spreading along the folds of her pannus somewhat but generally the surface looks somewhat better. She is not using her abdominal binder although she did bring it in this time She arrives in clinic complaining of pain and itching in the right lateral rib area. She has 3 open areas here which are circular wounds. These are new She also mentions her lower sacrum and there are 2 wounds in this area. These are new 5/7;  abdominal wound is about the same although in general it looks better than a few weeks ago at which time it had punched out depth. No debridement is required. She will not wear her abdominal binder  She has areas on her right lateral rib cage 3 open areas circular wounds these look about the same as last time. I am assuming these have something to do with the prurigo nodularis that she may have scratched but I am not certain. Finally she has a linear area on her lower sacrum which I am assuming is a pressure area 5/21-Patient returns at 2 weeks for abdominal wound, areas on the right lateral rib cage, and lower sacrum linear area which is a pressure sore. We have been using silver alginate to all these wounds. She has been asked to wear abdominal binder which she is not. she has 2 new wounds on right flank area and on her coccyx. she has a total of 6 wounds 6/9; I have been following this patient for a prolonged period of time for wounds on her lower mid abdomen which I think were secondary to severe lymphedema. She had an area on the left of the midline that eventually healed only to open on the right side. This is still open we have been using silver alginate. She will not use an abdominal binder. About a month ago she arrived here with open areas on the right lateral ribs. This is in the middle of a large amount of soft tissue/pannus fold. She has 4 open areas here at this time. Finally she has 2 open areas in her lower coccyx. We have been using silver alginate on all the wound areas 6/25; we are following this woman for wounds on her lower mid abdomen in the setting of a pannus and severe dependent lymphedema. More recently she has had areas in the folds on the right lateral rib area. Culture I did of this area last time was negative. Finally she has an area on the coccyx. We have been using silver alginate to all the wound areas 7/9; this is a patient with morbid obesity severe lymphedema in  her lower abdominal pannus/severe dependent abdominal lymphedema. She first came to this clinic with an area on the left lower central abdomen. This eventually healed over. About the same time as it did heal over she developed an area in close juxtaposition on the other side of the midline. We have been following this for some months when she was here last time I thought things were improving although she a lot arrives today with a larger wound. She will not use her external abdominal binder. Over the last several weeks she has had areas on her right lateral rib area. The exact etiology of this is unclear we have been using silver alginate. She arrives today with the entire area inflamed angry with loss of surface epithelium and marked pruritus. I think this is tinea/Candida. This could be secondary or the primary in the etiology of these expanding areas. 7/23; the area on the right lateral rib areas is quite a bit better. Skin that we treated with oral and topical antifungals looks a lot better around this area. Unfortunately the lower abdominal area actually measures larger. There is no drainage here really no surrounding erythema. 8/6-Patient arrives to the clinic at 3 weeks, the right lateral rib cage area has copious drainage but the wounds look a little better, the abdominal midline wound unfortunately is larger, she is not able to use the binder both wounds have a clean base 8/20. Since the patient was last here she was hospitalized from 813 through 818. This was predominantly because of hyperkalemia secondary to lisinopril and dehydration. Lisinopril was  discontinued at discharge. She was also felt to have mild cellulitis around several skin ulcers she was given antibiotics in the hospital but not discharged on antibiotics. Also noted that she has stage IV chronic kidney disease with a baseline creatinine at 1.7. 9/3 the patient still has a 2 wound areas lower mid abdominal pannus and the  right lateral ribs. We have been using polymen to the abdominal wound and silver alginate to the area on the folds on her right lateral ribs. She is complaining about pruritus around the areas on the ribs. 9/17; 2-week follow-up. The area on the mid lower abdomen better using PolyMem. She has 4 small areas in the pannus fold on the right lateral ribs and is a new area on the left lateral lower abdominal folds this week. 10/1; 2-week follow-up. She has been using PolyMem on the mid lower abdomen and really not much improvement. She has this for small areas in the pannus fold of the right lateral ribs. These are gradually I think improving. The area that was new last time on the left lower abdominal pannus has closed over. 10/15; 2-week follow-up. I changed to silver collagen on the mid lower abdomen wound which seems smaller today. She is still using silver alginate on the right lateral ribs and a pannus fold and this looks better today although she is complaining of itching in this area She has had an outbreak on her face which almost looks like cystic acne. She is going to see her primary doctor about this tomorrow 11/5; silver collagen on the mid lower abdomen which seems to be doing a good job we are using silver alginate in the pannus folds on her right lateral ribs. She is complaining of pruritus in this area which is probably intertrigo/Candida. I will prescribe nystatin powder Electronic Signature(s) Signed: 12/27/2018 5:45:12 PM By: Linton Ham MD Entered By: Linton Ham on 12/27/2018 13:16:14 -------------------------------------------------------------------------------- Physical Exam Details Patient Name: Date of Service: GRESIA, ISIDORO 12/27/2018 10:45 AM Medical Record [id number] Patient Account Number: [id number] Date of Birth/Sex: Treating RN: August 19, 1955 (63 y.o. [name] Primary Care Provider: SYSTEM, PCP Other Clinician: Referring Provider:  Treating Provider/Extender:, Dulcy Fanny, Fuller Plan in Treatment: 78 Constitutional Sitting or standing Blood Pressure is within target range for patient.. Pulse regular and within target range for patient.Marland Kitchen Respirations regular, non-labored and within target range.. Temperature is normal and within the target range for the patient.Marland Kitchen Appears in no distress. Eyes Conjunctivae clear. No discharge.no icterus. Respiratory work of breathing is normal. Gastrointestinal (GI) Massively obese nontender. Integumentary (Hair, Skin) Multiple areas of prurigo nodularis. Psychiatric appears at normal baseline. Notes Wound exam; midline abdomen is a lot better. No debridement is required. She has a rim of epithelialization. This seems to be closing down gradually. There is still however a lot more surface area to go 2 small areas in the original pannus folds on her flank. The skin around these does not look too bad nor does it look bad in the pannus fold underneath this but the patient complains of extreme pruritus. Electronic Signature(s) Signed: 12/27/2018 5:45:12 PM By: Linton Ham MD Entered By: Linton Ham on 12/27/2018 13:19:32 -------------------------------------------------------------------------------- Physician Orders Details Patient Name: Date of Service: OTILIA, KAREEM 12/27/2018 10:45 AM Medical Record [id number] Patient Account Number: [id number] Date of Birth/Sex: Treating RN: Mar 30, 1955 (63 y.o. [name] Primary Care Provider: SYSTEM, PCP Other Clinician: Referring Provider: Treating Provider/Extender:, Dulcy Fanny, Fuller Plan in Treatment: 64 Verbal / Phone Orders: No  Diagnosis Coding ICD-10 Coding Code Description Disruption of external operation (surgical) wound, not elsewhere classified, subsequent T81.31XD encounter Unspecified open wound of abdominal wall, left lower quadrant without penetration into  peritoneal S31.104D cavity, subsequent encounter L98.498 Non-pressure chronic ulcer of skin of other sites with other specified severity I89.0 Lymphedema, not elsewhere classified B35.4 Tinea corporis Follow-up Appointments Return appointment in 3 weeks. Dressing Change Frequency Wound #3 Right Abdomen - Lower Quadrant Other: - change dressing twice a week. Wound #4 Right Flank Other: - change dressing twice a week. Skin Barriers/Peri-Wound Care Wound #3 Right Abdomen - Lower Quadrant Skin Prep Wound #4 Right Flank Skin Prep Antifungal powder Wound Cleansing May shower and wash wound with soap and water. Primary Wound Dressing Wound #3 Right Abdomen - Lower Quadrant Collagen - moisten with hydrogel Wound #4 Right Flank Calcium Alginate with Silver Secondary Dressing Wound #3 Right Abdomen - Lower Quadrant Dry Gauze ABD pad - secure with tape Wound #4 Right Flank ABD pad Drawtex Off-Loading Turn and reposition every 2 hours Other: - abdominal binder to support abdomen. Patient to float right heel while in chair or bed with pillow. Patient to keep pressure off right heel as much as possible. Roll up a face cloth or a pillowcase to separate all skin folds- under breast, abdomen, both flank areas. [facility] for wound care. - [facility] change once a week on mondays when patient comes to wound center on every other Thursdays. Home Health to change twice a week every other week Mondays and Thursdays when patient is not at wound center. Patient Medications Allergies: Flexeril, morphine, penicillin Notifications Medication Indication Start End nystatin 12/27/2018 DOSE topical 100,000 unit/gram powder - powder topical to affected area tid Electronic Signature(s) Signed: 12/27/2018 1:24:14 PM By: Linton Ham MD Entered By: Linton Ham on 12/27/2018  13:24:14 -------------------------------------------------------------------------------- Problem List Details Patient Name: Date of Service: [name]. 12/27/2018 10:45 AM Medical Record [id number] Patient Account Number: [id number] Date of Birth/Sex: Treating RN: 09-05-1955 (63 y.o. [name] Primary Care Provider: SYSTEM, PCP Other Clinician: Referring Provider: Treating Provider/Extender:, Dulcy Fanny, Fuller Plan in Treatment: 78 Active Problems ICD-10 Evaluated Encounter Code Description Active Date Today Diagnosis T81.31XD Disruption of external operation (surgical) wound, not 06/29/2017 No Yes elsewhere classified, subsequent encounter S31.104D Unspecified open wound of abdominal wall, left lower 06/29/2017 No Yes quadrant without penetration into peritoneal cavity, subsequent encounter L98.498 Non-pressure chronic ulcer of skin of other sites with 06/14/2018 No Yes other specified severity I89.0 Lymphedema, not elsewhere classified 08/30/2018 No Yes B35.4 Tinea corporis 08/30/2018 No Yes Inactive Problems ICD-10 Code Description Active Date Inactive Date M71.011 Abscess of bursa, right shoulder 06/29/2017 06/29/2017 L89.616 Pressure-induced deep tissue damage of right heel 06/07/2018 06/07/2018 L89.150 Pressure ulcer of sacral region, unstageable 06/14/2018 06/14/2018 Resolved Problems Electronic Signature(s) Signed: 12/27/2018 5:45:12 PM By: Linton Ham MD Entered By: Linton Ham on 12/27/2018 13:14:47 -------------------------------------------------------------------------------- Progress Note Details Patient Name: Date of Service: [name]. 12/27/2018 10:45 AM Medical Record [id number] Patient Account Number: [id number] Date of Birth/Sex: Treating RN: [birth year] (63 y.o. [name] Primary Care Provider: SYSTEM, PCP Other Clinician: Referring Provider: Treating Provider/Extender:, Dulcy Fanny,  Fuller Plan in Treatment: 78 Subjective History of Present Illness (HPI) 06/29/17; this is a 63 year old woman who is a poor historian. It appears that she's had limited medical care over the last 2 years or so and what she had his through the [place]. She is here for review of a  draining area on her right shoulder anteriorly as well as a nonhealing surgical wound in the left lower abdomen. Per her description she had a hysterectomy in 2006 4 "tumors". About 9 months later she had have another operation in the same area because of "tumors" involving her fallopian tubes and ovary. Sounds as though the surgical wound in this area that took a long time to heal. Apparently the area on her left lower abdomen is been open for the last 6 or 7 months. She is been applying topical antibiotics. 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 She had a fall in late 2018 and the open area on top of her shoulder. She is had draining pus coming out of this wound since she's been applying topical antibiotics and a Band-Aid. She is not complaining of a lot of pain or fever although she did have chills today. She saw her doctor at the [place] yesterday she doesn't know her name she apparently was referred to a surgeon at the [facility] [place] presumably an orthopedic surgeon to look at that shoulder. She also has a primary doctor in [place] by the name of Dr. Jimmye Norman who referred her here after seeing her within the last month. 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 We don't have a lot of information in her in Epic. Last hospitalization was from 11/21/16 through 11/24/16 with hypoglycemia felt to be secondary to sulfonylureas. There is no mention of a wound at that time. 07/07/17; patient's shoulder cultured MSSA. I put her on clindamycin to had some anaerobic coverage with  caution about diarrhea. I had asked her to make an orthopedic follow-up at the [place] with a doctor that made the original surgery but she has not done that yet. ooShe also has a fairly large wound in the left lower abdomen in the middle of his surgical scar. This was apparently gynecologic surgery perhaps fibroid surgery. In any case these wounds are larger today. 07/21/17; the patient went to see the orthopedic doctor at the [facility] who did her original surgery surgery. Per the patient's description the draining area had closed down. He didn't think anything needed to be done there were no imaging studies. ooThe lower abdominal wound in the left mid pelvis area also looks somewhat better to me. There is less surface material on this still some probing areas but not a lot of depth. We've been using silver alginate 08/03/17; the patient arrives back in 2 week follow-up. The draining sinus on her right shoulder has not reopened although she is complaining of widespread shoulder and other joint pain. She is using silver alginate to the remaining surgical wound on her abdomen. 08/18/17; the patient has 2 week follow-up appointment. The draining sinus on her right shoulder is not reopened. She is complaining of left shoulder pain today. The presumed surgical wound on her lower abdomen looks quite a bit better and this is really contracted quite nicely. When she first came in the deep sinus she had on the anterior shoulder cultured MSSA. I gave her clindamycin. I arranged for orthopedic follow-up at the [facility] however this is already closed nevertheless this was a deep probing tunnel and I think it needs to be watched for reopening. 09/07/17; surgical wound on her lower abdomen. This as 2 attached leaflets. The larger one is superiorly. A small probing area here with some sanguinous drainage.  Surface needed debridement today. We've been using silver alginate however I changed this to silver collagen today 09/15/17.  Surgical wound on her lower abdomen. No recent improvement although this is markedly improved from when we first started seeing her although the original problem was a wound on her right shoulder which is long since closed.switched her to Silver collagen last week She arrives in clinic today complaining of abdominal pain "so bad I almost went to the ER". She tells me she had an endoscopy and colonoscopy about a week ago. This was at the [place], I have no way to look at this. She is unaware of any biopsy was done 10/02/17 on evaluation today patient continues to do well in regard to her abdominal wound were using Prisma it's obviously taken some time for this to heal but nonetheless I do think that she is making good progress currently. There does not appear to be any evidence of infection at this time which is good news. She still does have some tunneling or undermining but again I think this is still doing well. 10/19/17; she continues to make good progress in this surgical abdominal wound using Prisma. This is come in nicely at one point had considerable depth. No evidence of infection. The patient is not complaining of pain in this area but is complaining of diabetic neuropathy pain in her right foot 11/02/17;the patient's wound is superficial and is really continued to make decent progress. I thought this would be more problematic when I first saw however this appears to a filled in nicely there is no depth. It exists in the crevice between what I think is lower abdominal hernias. She comes in complaining of generalized pain however she is running out of her narcotics providedto her by a local pain clinic. She is asking for home health however she is [place] as I understand we cannot order this. 11/16/2017; 2-week follow-up. She has a lower abdominal surgical wound and this appears to be making nice progress closing in nicely each time we see it. This was initially a deep crevice surgical area between what  I think is to lower abdominal hernias. In spite of this wound has healthy granulation. There does not appear to be any depth at all. There is probably a lot of tensile stress on the wound area however in spite of this things actually look better 11/30/2017; 2-week follow-up. She is complaining of left lower quadrant abdominal pain it is difficult to characterize this clearly episodic and not really associated with nausea or vomiting. She follows at the [place]. Tells me she was recently seen there by her new primary physician. The wound is in the lower abdomen in the middle of what I think was a gynecologic surgical site. We have made progress with this. 12/14/2017; 2-week follow-up. The patient comes in today with an improved wound area on the lower mid abdomen surgical scar which was the wound we have been following her for. Paradoxically she has developed a new open area just to the right of the midline almost a mirror imaging wound on the right. She states this came up about a week ago. She does not have a clear explanation. Both of these wounds are in surgical scar areas although from her I think this surgery was remote. She denies scratching this or traumatizing it. It is surrounded by 2 large areas that I suspect are herniations but I still cannot see how this would have come from trauma. 12/28/2017; the area on the  left surgical scar in the lower abdomen is better however the area on the right that we identified last time is worse. Increase in dimensions. There is no obvious infection here. We have been using silver alginate. She tells me she has been to the ER at the [place] and they gave her doxycycline she also saw her primary doctor yesterday. I think she needs to be referred to dermatology consideration of a skin biopsy question panniculitis 01/11/2018; we continue to have improvement in the left lower mid abdominal wound however the area on the right that we identified in late October has  gotten quite a bit worse. There is not appear to be surrounding infection although the patient is complaining of a lot of pain. We have been using silver alginate. [facility] I think this should help 01/24/18 on evaluation today patient actually apparently has two abdominal wounds the one on the right is new here and unfortunately seems to be doing a little bit worse and that it's a little bit larger. Fortunately there does not appear to be any sign of infection at this time. No fevers, chills, nausea, or vomiting noted at this time. Overall again it sounds like this is a fairly complex issue and despite the fact that we been using silver nitrate in the [facility] D[facility] still has some hyper granulation. No fevers, chills, nausea, or vomiting noted at this time. 01/31/18 plan evaluation today patient appears to be doing better in general in regard to her wounds. She has been tolerating the dressing changes without complication. I do feel like that right now she's making some progress so she still is having some discomfort. 02/07/18 on evaluation today patient actually appears to be doing rather well in regard to her double ulcers as far as I'm concerned. With that being said she is somewhat frustrated in general in regard to the overall appearance of everything. I feel like this is unwarranted however since she seems to be making progress. 02/15/2018; lower mid abdomen right much greater than left although the left was the original wound. The area on the left looks really quite good and is smaller. The area on the right seems longer and measures worse in terms of dimensions. These areas are in the most dependent part of her large abdominal pannus and this may be the entire issue. at [facility] on the left there is lymphedema around the wound. The patient is seeing dermatology next week apparently in [facility] 03/01/2017; she likely had  abdomen. Right now he other than left ankle both he has better today. I think both of these areas are smaller. Surface looks healthier she is due to see dermatology later this month. Using [facility] 1/23; abdominal wounds in the lower mid abdomen. This is in a very dependent area in her lower abdominal pannus and the simple tension in this area may have a lot to do with this. She also has constant chronic pruritus and scratching and I wonder if this has something to do with open wounds in this area. She tells me her daughter is helping her with the dressings currently and the original wound on the left side of her mid abdomen is better almost closed the larger area on the right also looks some better. She sees a dermatologist next week I have told her to ask the dermatologist for help with generalized pruritus. The probable skin wounds that she has all over secondary to scratching.  The wounds we have been treating may be related to simple tension in a dependent lymphedematous area. She could benefit from an abdominal binder to help support this area 2/13; abdominal wounds in the lower mid abdomen. Very dependent in her abdomen and this may be secondary to tension in the lower part of her abdominal pannus plus or minus edema. 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 She claims not to be scratching at the wound areas. 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 They give her gentian violet to put on the wound area but this is already present and Hydrofera Blue. The original wound on the left is healed today. However the area on the right part of her mid abdominal pannus appears larger. We are going to try to work with her on getting an abdominal binder to help support the weight  of the pannus around the wounds 3/9; abdominal wound in the lower mid abdomen. She apparently has an abdominal binder that she bought on [employer]. She is using Hydrofera Blue to the wound on the right of the midline. The original wound was on the left and is closed over. 3/26; abdominal wound in the lower mid abdomen. This is her second wound in this area. The lower part of her abdominal pannus surrounding surgical scar. Once again she is not wearing the abdominal binder she said she obtained. The dressing is Hydrofera Blue 4/16; abdomen in the right lower mid abdomen. This is the second wound in this area the other was a mirror image area on the left. I had her seen by dermatology at the [facility] this was not very helpful. She is not wearing her abdominal binder. We have been using Hydrofera Blue which is what healed at the other side. The abdominal wound is actually larger She arrives in clinic today complaining of the plantar right heel. Very tender. She has been pushing herself up in her bed with her heels and this may be a deep tissue injury. 4/23; abdomen wound is about the same. Spreading along the folds of her pannus somewhat but generally the surface looks somewhat better. She is not using her abdominal binder although she did bring it in this time ooShe arrives in clinic complaining of pain and itching in the right lateral rib area. She has 3 open areas here which are circular wounds. These are new ooShe also mentions her lower sacrum and there are 2 wounds in this area. These are new 5/7; abdominal wound is about the same although in general it looks better than a few weeks ago at which time it had punched out depth. No debridement is required. She will not wear her abdominal binder ooShe has areas on her right lateral rib cage 3 open areas circular wounds these look about the same as last time. I am assuming these have something to do with the prurigo nodularis that she may have scratched  but I am not certain. Finally she has a linear area on her lower sacrum which I am assuming is a pressure area 5/21-Patient returns at 2 weeks for abdominal wound, areas on the right lateral rib cage, and lower sacrum linear area which is a pressure sore. We have been using silver alginate to all these wounds. She has been asked to wear abdominal  binder which she is not. she has 2 new wounds on right flank area and on her coccyx. she has a total of 6 wounds 6/9; I have been following this patient for a prolonged period of time for wounds on her lower mid abdomen which I think were secondary to severe lymphedema. She had an area on the left of the midline that eventually healed only to open on the right side. This is still open we have been using silver alginate. She will not use an abdominal binder. About a month ago she arrived here with open areas on the right lateral ribs. This is in the middle of a large amount of soft tissue/pannus fold. She has 4 open areas here at this time. Finally she has 2 open areas in her lower coccyx. We have been using silver alginate on all the wound areas 6/25; we are following this woman for wounds on her lower mid abdomen in the setting of a pannus and severe dependent lymphedema. More recently she has had areas in the folds on the right lateral rib area. Culture I did of this area last time was negative. Finally she has an area on the coccyx. We have been using silver alginate to all the wound areas 7/9; this is a patient with morbid obesity severe lymphedema in her lower abdominal pannus/severe dependent abdominal lymphedema. She first came to this clinic with an area on the left lower central abdomen. This eventually healed over. About the same time as it did heal over she developed an area in close juxtaposition on the other side of the midline. We have been following this for some months when she was here last time I thought things were improving although  she a lot arrives today with a larger wound. She will not use her external abdominal binder. Over the last several weeks she has had areas on her right lateral rib area. The exact etiology of this is unclear we have been using silver alginate. She arrives today with the entire area inflamed angry with loss of surface epithelium and marked pruritus. I think this is tinea/Candida. This could be secondary or the primary in the etiology of these expanding areas. 7/23; the area on the right lateral rib areas is quite a bit better. Skin that we treated with oral and topical antifungals looks a lot better around this area. Unfortunately the lower abdominal area actually measures larger. There is no drainage here really no surrounding erythema. 8/6-Patient arrives to the clinic at 3 weeks, the right lateral rib cage area has copious drainage but the wounds look a little better, the abdominal midline wound unfortunately is larger, she is not able to use the binder both wounds have a clean base 8/20. Since the patient was last here she was hospitalized from 813 through 818. This was predominantly because of hyperkalemia secondary to lisinopril and dehydration. Lisinopril was discontinued at discharge. She was also felt to have mild cellulitis around several skin ulcers she was given antibiotics in the hospital but not discharged on antibiotics. Also noted that she has stage IV chronic kidney disease with a baseline creatinine at 1.7. 9/3 the patient still has a 2 wound areas lower mid abdominal pannus and the right lateral ribs. We have been using polymen to the abdominal wound and silver alginate to the area on the folds on her right lateral ribs. She is complaining about pruritus around the areas on the ribs. 9/17; 2-week follow-up. The area on the mid lower  abdomen better using PolyMem. She has 4 small areas in the pannus fold on the right lateral ribs and is a new area on the left lateral lower  abdominal folds this week. 10/1; 2-week follow-up. She has been using PolyMem on the mid lower abdomen and really not much improvement. She has this for small areas in the pannus fold of the right lateral ribs. These are gradually I think improving. The area that was new last time on the left lower abdominal pannus has closed over. 10/15; 2-week follow-up. I changed to silver collagen on the mid lower abdomen wound which seems smaller today. She is still using silver alginate on the right lateral ribs and a pannus fold and this looks better today although she is complaining of itching in this area She has had an outbreak on her face which almost looks like cystic acne. She is going to see her primary doctor about this tomorrow 11/5; silver collagen on the mid lower abdomen which seems to be doing a good job we are using silver alginate in the pannus folds on her right lateral ribs. She is complaining of pruritus in this area which is probably intertrigo/Candida. I will prescribe nystatin powder Objective Constitutional Sitting or standing Blood Pressure is within target range for patient.. Pulse regular and within target range for patient.Marland Kitchen Respirations regular, non-labored and within target range.. Temperature is normal and within the target range for the patient.Marland Kitchen Appears in no distress. Vitals Time Taken: 11:58 AM, Height: 62 in, Weight: 260 lbs, BMI: 47.5, Temperature: 98.2 F, Pulse: 70 bpm, Respiratory Rate: 18 breaths/min, Blood Pressure: 114/47 mmHg. Eyes Conjunctivae clear. No discharge.no icterus. Respiratory work of breathing is normal. Gastrointestinal (GI) Massively obese nontender. Psychiatric appears at normal baseline. General Notes: Wound exam; midline abdomen is a lot better. No debridement is required. She has a rim of epithelialization. This seems to be closing down gradually. There is still however a lot more surface area to go oo2 small areas in the original  pannus folds on her flank. The skin around these does not look too bad nor does it look bad in the pannus fold underneath this but the patient complains of extreme pruritus. Integumentary (Hair, Skin) Multiple areas of prurigo nodularis. Wound #3 status is Open. Original cause of wound was Gradually Appeared. The wound is located on the Right Abdomen - Lower Quadrant. The wound measures 6.2cm length x 3.4cm width x 0.1cm depth; 16.556cm^2 area and 1.656cm^3 volume. There is Fat Layer (Subcutaneous Tissue) Exposed exposed. There is no tunneling or undermining noted. There is a medium amount of serosanguineous drainage noted. The wound margin is distinct with the outline attached to the wound base. There is large (67-100%) red, pink granulation within the wound bed. There is a small (1-33%) amount of necrotic tissue within the wound bed including Adherent Slough. Wound #4 status is Open. Original cause of wound was Gradually Appeared. The wound is located on the Right Flank. The wound measures 1.1cm length x 10.5cm width x 0.2cm depth; 9.071cm^2 area and 1.814cm^3 volume. There is Fat Layer (Subcutaneous Tissue) Exposed exposed. There is no tunneling or undermining noted. There is a medium amount of serosanguineous drainage noted. The wound margin is distinct with the outline attached to the wound base. There is large (67-100%) red, pink granulation within the wound bed. There is no necrotic tissue within the wound bed. Assessment Active Problems ICD-10 Disruption of external operation (surgical) wound, not elsewhere classified, subsequent encounter Unspecified open wound of abdominal  wall, left lower quadrant without penetration into peritoneal cavity, subsequent encounter Non-pressure chronic ulcer of skin of other sites with other specified severity Lymphedema, not elsewhere classified Tinea corporis Plan Follow-up Appointments: Return appointment in 3 weeks. Dressing Change  Frequency: Wound #3 Right Abdomen - Lower Quadrant: Other: - change dressing twice a week. Wound #4 Right Flank: Other: - change dressing twice a week. Skin Barriers/Peri-Wound Care: Wound #3 Right Abdomen - Lower Quadrant: Skin Prep Wound #4 Right Flank: Skin Prep Antifungal powder Wound Cleansing: May shower and wash wound with soap and water. Primary Wound Dressing: Wound #3 Right Abdomen - Lower Quadrant: Collagen - moisten with hydrogel Wound #4 Right Flank: Calcium Alginate with Silver Secondary Dressing: Wound #3 Right Abdomen - Lower Quadrant: Dry Gauze ABD pad - secure with tape Wound #4 Right Flank: ABD pad Drawtex Off-Loading: Turn and reposition every 2 hours Other: - abdominal binder to support abdomen. Patient to float right heel while in chair or bed with pillow. Patient to keep pressure off right heel as much as possible. Roll up a face cloth or a pillowcase to separate all skin folds- under breast, abdomen, both flank areas. Home Health: [facility] for [facility]. - [facility] change once a week on mondays when patient comes to wound center on every other Thursdays. Home Health to change twice a week every other week Mondays and Thursdays when patient is not at wound center. The following medication(s) was prescribed: nystatin topical 100,000 unit/gram powder powder topical to affected area tid starting 12/27/2018 1. We are continuing with silver alginate to the pannus wounds on the right lateral ribs this is making improvement 2. Silver collagen to the large area on the lower abdominal midline folds. This seems to also be making progress 3. Nystatin powder put in for her presumed mild intertrigo Electronic Signature(s) Signed: 12/27/2018 1:24:38 PM By: Linton Ham MD Entered By: Linton Ham on 12/27/2018 13:24:38 -------------------------------------------------------------------------------- SuperBill  Details Patient Name: Date of Service: [name] 12/27/2018 Medical Record Number:[id number] Patient Account Number: [id number] Date of Birth/Sex: Treating RN: [birth year] (63 y.o. Helene Shoe, Tammi Klippel Primary Care Provider: SYSTEM, PCP Other Clinician: Referring Provider: Treating Provider/Extender:, Dulcy Fanny, Fuller Plan in Treatment: 78 Diagnosis Coding ICD-10 Codes Code Description Disruption of external operation (surgical) wound, not elsewhere classified, subsequent T81.31XD encounter Unspecified open wound of abdominal wall, left lower quadrant without penetration into peritoneal S31.104D cavity, subsequent encounter L98.498 Non-pressure chronic ulcer of skin of other sites with other specified severity I89.0 Lymphedema, not elsewhere classified B35.4 Tinea corporis Facility Procedures CPT4 Code: 62035597 Description: 41638 - WOUND CARE VISIT-LEV 5 EST PT Modifier: 1 Quantity: Physician Procedures CPT4 Description: Code [id number] - WC PHYS LEVEL 3 - EST PT ICD-10 Diagnosis Description S31.104D Unspecified open wound of abdominal wall, left lower quad into peritoneal cavity, subsequent encounter L98.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

## 2019-01-16 ENCOUNTER — Ambulatory Visit (HOSPITAL_BASED_OUTPATIENT_CLINIC_OR_DEPARTMENT_OTHER): Payer: Medicare Other | Admitting: Physician Assistant

## 2019-01-24 ENCOUNTER — Other Ambulatory Visit: Payer: Self-pay

## 2019-01-24 ENCOUNTER — Encounter (HOSPITAL_BASED_OUTPATIENT_CLINIC_OR_DEPARTMENT_OTHER): Payer: Medicare Other | Attending: Internal Medicine | Admitting: Internal Medicine

## 2019-01-24 DIAGNOSIS — I890 Lymphedema, not elsewhere classified: Secondary | ICD-10-CM | POA: Diagnosis not present

## 2019-01-24 DIAGNOSIS — L281 Prurigo nodularis: Secondary | ICD-10-CM | POA: Insufficient documentation

## 2019-01-24 DIAGNOSIS — S31103A Unspecified open wound of abdominal wall, right lower quadrant without penetration into peritoneal cavity, initial encounter: Secondary | ICD-10-CM | POA: Insufficient documentation

## 2019-01-24 DIAGNOSIS — L98492 Non-pressure chronic ulcer of skin of other sites with fat layer exposed: Secondary | ICD-10-CM | POA: Insufficient documentation

## 2019-01-24 DIAGNOSIS — Y838 Other surgical procedures as the cause of abnormal reaction of the patient, or of later complication, without mention of misadventure at the time of the procedure: Secondary | ICD-10-CM | POA: Insufficient documentation

## 2019-01-25 NOTE — Progress Notes (Signed)
[name] [name], [name] [name] ([id number]) Visit Report for 01/24/2019 HPI Details Patient Name: Date of Service: [name] [name], [name] [name] 01/24/2019 10:45 AM Medical Record Number:[id number] Patient Account Number: [id number] Date of Birth/Sex: Treating RN: 1955-02-25 (63 y.o. F) Primary Care Provider: SYSTEM, PCP Other Clinician: Referring Provider: Treating Provider/Extender:, Dulcy Fanny, Fuller Plan in Treatment: 82 History of Present Illness HPI Description: 06/29/17; this is a 63 year old woman who is a poor historian. It appears that she's had limited medical care over the last 2 years or so and what she had his through the [place]. She is here for review of a draining area on her right shoulder anteriorly as well as a nonhealing surgical wound in the left lower abdomen. Per her description she had a hysterectomy in 2006 4 "tumors". About 9 months later she had have another operation in the same area because of "tumors" involving her fallopian tubes and ovary. Sounds as though the surgical wound in this area that took a long time to heal. Apparently the area on her left lower abdomen is been open for the last 6 or 7 months. She is been applying topical antibiotics. 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 She had a fall in late 2018 and the open area on top of her shoulder. She is had draining pus coming out of this wound since she's been applying topical antibiotics and a Band-Aid. She is not complaining of a lot of pain or fever although she did have chills today. She saw her doctor at the [place] yesterday she doesn't know her name she apparently was referred to a surgeon at the [facility] [place] presumably an orthopedic surgeon to look at that shoulder. She also has a primary doctor in [place] by the name of Dr. Jimmye Norman who referred her here after seeing her within the last month. 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 We don't have a lot of information in her in Epic. Last hospitalization was from 11/21/16 through 11/24/16 with hypoglycemia felt to be secondary to sulfonylureas. There is no mention of a wound at that time. 07/07/17; patient's shoulder cultured MSSA. I put her on clindamycin to had some anaerobic coverage with caution about diarrhea. I had asked her to make an orthopedic follow-up at the [place] with a doctor that made the original surgery but she has not done that yet. She also has a fairly large wound in the left lower abdomen in the middle of his surgical scar. This was apparently gynecologic surgery perhaps fibroid surgery. In any case these wounds are larger today. 07/21/17; the patient went to see the orthopedic doctor at the [facility] who did her original surgery surgery. Per the patient's description the draining area had closed down. He didn't think anything needed to be done there were no imaging studies. The lower abdominal wound in the left mid pelvis area also looks somewhat better to me. There is less surface material on this still some probing areas but not a lot of depth. We've been using silver alginate 08/03/17; the patient arrives back in 2 week follow-up. The draining sinus on her right shoulder has not reopened although she is complaining of widespread shoulder and other joint pain. She is using silver alginate to the remaining surgical wound on her abdomen. 08/18/17; the patient has 2 week follow-up appointment. The draining sinus on her right shoulder is not reopened. She is  complaining of left shoulder pain today. The presumed surgical wound on her lower abdomen looks quite a bit better and this is really contracted quite nicely. When she first came in the deep sinus she had on the anterior shoulder cultured MSSA. I gave her clindamycin. I arranged for orthopedic follow-up at the [facility] however this is already  closed nevertheless this was a deep probing tunnel and I think it needs to be watched for reopening. 09/07/17; surgical wound on her lower abdomen. This as 2 attached leaflets. The larger one is superiorly. A small probing area here with some sanguinous drainage. Surface needed debridement today. We've been using silver alginate however I changed this to silver collagen today 09/15/17. Surgical wound on her lower abdomen. No recent improvement although this is markedly improved from when we first started seeing her although the original problem was a wound on her right shoulder which is long since closed.switched her to Silver collagen last week She arrives in clinic today complaining of abdominal pain "so bad I almost went to the ER". She tells me she had an endoscopy and colonoscopy about a week ago. This was at the [place], I have no way to look at this. She is unaware of any biopsy was done 10/02/17 on evaluation today patient continues to do well in regard to her abdominal wound were using Prisma it's obviously taken some time for this to heal but nonetheless I do think that she is making good progress currently. There does not appear to be any evidence of infection at this time which is good news. She still does have some tunneling or undermining but again I think this is still doing well. 10/19/17; she continues to make good progress in this surgical abdominal wound using Prisma. This is come in nicely at one point had considerable depth. No evidence of infection. The patient is not complaining of pain in this area but is complaining of diabetic neuropathy pain in her right foot 11/02/17;the patient's wound is superficial and is really continued to make decent progress. I thought this would be more problematic when I first saw however this appears to a filled in nicely there is no depth. It exists in the crevice between what I think is lower abdominal hernias. She comes in complaining of generalized  pain however she is running out of her narcotics providedto her by a local pain clinic. She is asking for home health however she is [place] as I understand we cannot order this. 11/16/2017; 2-week follow-up. She has a lower abdominal surgical wound and this appears to be making nice progress closing in nicely each time we see it. This was initially a deep crevice surgical area between what I think is to lower abdominal hernias. In spite of this wound has healthy granulation. There does not appear to be any depth at all. There is probably a lot of tensile stress on the wound area however in spite of this things actually look better 11/30/2017; 2-week follow-up. She is complaining of left lower quadrant abdominal pain it is difficult to characterize this clearly episodic and not really associated with nausea or vomiting. She follows at the [place]. Tells me she was recently seen there by her new primary physician. The wound is in the lower abdomen in the middle of what I think was a gynecologic surgical site. We have made progress with this. 12/14/2017; 2-week follow-up. The patient comes in today with an improved wound area on the lower mid abdomen surgical scar which  was the wound we have been following her for. Paradoxically she has developed a new open area just to the right of the midline almost a mirror imaging wound on the right. She states this came up about a week ago. She does not have a clear explanation. Both of these wounds are in surgical scar areas although from her I think this surgery was remote. She denies scratching this or traumatizing it. It is surrounded by 2 large areas that I suspect are herniations but I still cannot see how this would have come from trauma. 12/28/2017; the area on the left surgical scar in the lower abdomen is better however the area on the right that we identified last time is worse. Increase in dimensions. There is no obvious infection here. We have been using  silver alginate. She tells me she has been to the ER at the [place] and they gave her doxycycline she also saw her primary doctor yesterday. I think she needs to be referred to dermatology consideration of a skin biopsy question panniculitis 01/11/2018; we continue to have improvement in the left lower mid abdominal wound however the area on the right that we identified in late October has gotten quite a bit worse. There is not appear to be surrounding infection although the patient is complaining of a lot of pain. We have been using silver alginate. [facility] I think this should help 01/24/18 on evaluation today patient actually apparently has two abdominal wounds the one on the right is new here and unfortunately seems to be doing a little bit worse and that it's a little bit larger. Fortunately there does not appear to be any sign of infection at this time. No fevers, chills, nausea, or vomiting noted at this time. Overall again it sounds like this is a fairly complex issue and despite the fact that we been using silver nitrate in the [facility] Dressing still has some hyper granulation. No fevers, chills, nausea, or vomiting noted at this time. 01/31/18 plan evaluation today patient appears to be doing better in general in regard to her wounds. She has been tolerating the dressing changes without complication. I do feel like that right now she's making some progress so she still is having some discomfort. 02/07/18 on evaluation today patient actually appears to be doing rather well in regard to her double ulcers as far as I'm concerned. With that being said she is somewhat frustrated in general in regard to the overall appearance of everything. I feel like this is unwarranted however since she seems to be making progress. 02/15/2018; lower mid abdomen right much greater than left although the left was the original wound. The area on the  left looks really quite good and is smaller. The area on the right seems longer and measures worse in terms of dimensions. These areas are in the most dependent part of her large abdominal pannus and this may be the entire issue. at [facility] on the left there is lymphedema around the wound. The patient is seeing dermatology next week apparently in [facility] 03/01/2017; she likely had abdomen. Right now he other than left ankle both he has better today. I think both of these areas are smaller. Surface looks healthier she is due to see dermatology later this month. Using [facility] 1/23; abdominal wounds in the lower mid abdomen. This is in a very dependent area in her lower abdominal pannus and the simple tension in  this area may have a lot to do with this. She also has constant chronic pruritus and scratching and I wonder if this has something to do with open wounds in this area. She tells me her daughter is helping her with the dressings currently and the original wound on the left side of her mid abdomen is better almost closed the larger area on the right also looks some better. She sees a dermatologist next week I have told her to ask the dermatologist for help with generalized pruritus. The probable skin wounds that she has all over secondary to scratching. The wounds we have been treating may be related to simple tension in a dependent lymphedematous area. She could benefit from an abdominal binder to help support this area 2/13; abdominal wounds in the lower mid abdomen. Very dependent in her abdomen and this may be secondary to tension in the lower part of her abdominal pannus plus or minus edema. 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 She claims not to be scratching at the wound areas. 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 They give her gentian violet to put on the wound area but this is already present and Hydrofera Blue. The original wound on the left is healed today. However the area on the right part of her mid abdominal pannus appears larger. We are going to try to work with her on getting an abdominal binder to help support the weight of the pannus around the wounds 3/9; abdominal wound in the lower mid abdomen. She apparently has an abdominal binder that she bought on [employer]. She is using Hydrofera Blue to the wound on the right of the midline. The original wound was on the left and is closed over. 3/26; abdominal wound in the lower mid abdomen. This is her second wound in this area. The lower part of her abdominal pannus surrounding surgical scar. Once again she is not wearing the abdominal binder she said she obtained. The dressing is Hydrofera Blue 4/16; abdomen in the right lower mid abdomen. This is the second wound in this area the other was a mirror image area on the left. I had her seen by dermatology at the [facility] this was not very helpful. She is not wearing her abdominal binder. We have been using Hydrofera Blue which is what healed at the other side. The abdominal wound is actually larger She arrives in clinic today complaining of the plantar right heel. Very tender. She has been pushing herself up in her bed with her heels and this may be a deep tissue injury. 4/23; abdomen wound is about the same. Spreading along the folds of her pannus somewhat but generally the surface looks somewhat better. She is not using her abdominal binder although she did bring it in this time She arrives in clinic complaining of pain and itching in the right lateral rib area. She has 3 open areas here which are circular wounds. These are new She also mentions her lower sacrum and there are 2 wounds in this area. These are new 5/7; abdominal wound is  about the same although in general it looks better than a few weeks ago at which time it had punched out depth. No debridement is required. She will not wear her abdominal binder She has  areas on her right lateral rib cage 3 open areas circular wounds these look about the same as last time. I am assuming these have something to do with the prurigo nodularis that she may have scratched but I am not certain. Finally she has a linear area on her lower sacrum which I am assuming is a pressure area 5/21-Patient returns at 2 weeks for abdominal wound, areas on the right lateral rib cage, and lower sacrum linear area which is a pressure sore. We have been using silver alginate to all these wounds. She has been asked to wear abdominal binder which she is not. she has 2 new wounds on right flank area and on her coccyx. she has a total of 6 wounds 6/9; I have been following this patient for a prolonged period of time for wounds on her lower mid abdomen which I think were secondary to severe lymphedema. She had an area on the left of the midline that eventually healed only to open on the right side. This is still open we have been using silver alginate. She will not use an abdominal binder. About a month ago she arrived here with open areas on the right lateral ribs. This is in the middle of a large amount of soft tissue/pannus fold. She has 4 open areas here at this time. Finally she has 2 open areas in her lower coccyx. We have been using silver alginate on all the wound areas 6/25; we are following this woman for wounds on her lower mid abdomen in the setting of a pannus and severe dependent lymphedema. More recently she has had areas in the folds on the right lateral rib area. Culture I did of this area last time was negative. Finally she has an area on the coccyx. We have been using silver alginate to all the wound areas 7/9; this is a patient with morbid obesity severe lymphedema in her lower abdominal  pannus/severe dependent abdominal lymphedema. She first came to this clinic with an area on the left lower central abdomen. This eventually healed over. About the same time as it did heal over she developed an area in close juxtaposition on the other side of the midline. We have been following this for some months when she was here last time I thought things were improving although she a lot arrives today with a larger wound. She will not use her external abdominal binder. Over the last several weeks she has had areas on her right lateral rib area. The exact etiology of this is unclear we have been using silver alginate. She arrives today with the entire area inflamed angry with loss of surface epithelium and marked pruritus. I think this is tinea/Candida. This could be secondary or the primary in the etiology of these expanding areas. 7/23; the area on the right lateral rib areas is quite a bit better. Skin that we treated with oral and topical antifungals looks a lot better around this area. Unfortunately the lower abdominal area actually measures larger. There is no drainage here really no surrounding erythema. 8/6-Patient arrives to the clinic at 3 weeks, the right lateral rib cage area has copious drainage but the wounds look a little better, the abdominal midline wound unfortunately is larger, she is not able to use the binder both wounds have a clean base 8/20. Since the patient was last here she was hospitalized from 813 through 818. This was predominantly because of hyperkalemia secondary to lisinopril and dehydration. Lisinopril was discontinued at  discharge. She was also felt to have mild cellulitis around several skin ulcers she was given antibiotics in the hospital but not discharged on antibiotics. Also noted that she has stage IV chronic kidney disease with a baseline creatinine at 1.7. 9/3 the patient still has a 2 wound areas lower mid abdominal pannus and the right lateral ribs.  We have been using polymen to the abdominal wound and silver alginate to the area on the folds on her right lateral ribs. She is complaining about pruritus around the areas on the ribs. 9/17; 2-week follow-up. The area on the mid lower abdomen better using PolyMem. She has 4 small areas in the pannus fold on the right lateral ribs and is a new area on the left lateral lower abdominal folds this week. 10/1; 2-week follow-up. She has been using PolyMem on the mid lower abdomen and really not much improvement. She has this for small areas in the pannus fold of the right lateral ribs. These are gradually I think improving. The area that was new last time on the left lower abdominal pannus has closed over. 10/15; 2-week follow-up. I changed to silver collagen on the mid lower abdomen wound which seems smaller today. She is still using silver alginate on the right lateral ribs and a pannus fold and this looks better today although she is complaining of itching in this area She has had an outbreak on her face which almost looks like cystic acne. She is going to see her primary doctor about this tomorrow 11/5; silver collagen on the mid lower abdomen which seems to be doing a good job we are using silver alginate in the pannus folds on her right lateral ribs. She is complaining of pruritus in this area which is probably intertrigo/Candida. I will prescribe nystatin powder 12/3. We are using silver collagen to her mid lower abdomen which looks better. The pannus area on the right lateral ribs we are using silver alginate she is pointing nystatin powder in here. The wounds look better. She is separating the folds with hand towels. We have suggested Interdry and she is going to look into getting that Water engineer) Signed: 01/25/2019 7:57:15 AM By: Linton Ham MD Entered By: Linton Ham on 01/24/2019  12:05:11 -------------------------------------------------------------------------------- Physical Exam Details Patient Name: Date of Service: GALAXY, BORDEN 01/24/2019 10:45 AM Medical Record [id number] Patient Account Number: [id number] Date of Birth/Sex: Treating RN: 1955-07-31 (63 y.o. F) Primary Care Provider: SYSTEM, PCP Other Clinician: Referring Provider: Treating Provider/Extender:, Dulcy Fanny, Fuller Plan in Treatment: 15 Constitutional Patient is hypotensive. However she appears in her usual state. Pulse regular and within target range for patient.Marland Kitchen Respirations regular, non-labored and within target range.. Temperature is normal and within the target range for the patient.Marland Kitchen Appears in no distress. Eyes Conjunctivae clear. No discharge.no icterus. Respiratory work of breathing is normal. Integumentary (Hair, Skin) No evidence of fungal infection in the folds on the right ribs. She has numerous healed areas from prurigo nodularis. Psychiatric appears at normal baseline. Notes Wound exam; midline abdomen this is a lot better no debridement is required dimensions are down. She had an area just across the midline here. I have no idea exactly why these are happening except perhaps dependent lymphedema. -The area on the right lateral ribs also looks quite good. There is no evidence of fungal infection here. Electronic Signature(s) Signed: 01/25/2019 7:57:15 AM By: Linton Ham MD Entered By: Linton Ham on 01/24/2019 12:06:59 -------------------------------------------------------------------------------- Physician Orders Details Patient Name: Date of  Service: JANAYLA, MARIK 01/24/2019 10:45 AM Medical Record Number:[id number] Patient Account Number: [id number] Date of Birth/Sex: Treating RN: Aug 25, 1955 (63 y.o. 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 Dressing Change Frequency Wound #3 Right Abdomen - Lower Quadrant Other: - change dressing twice a week. Wound #4 Right Flank Other: - change dressing twice a week. Skin Barriers/Peri-Wound Care Wound #3 Right Abdomen - Lower Quadrant Skin Prep Wound #4 Right Flank Skin Prep Antifungal powder Wound Cleansing May shower and wash wound with soap and water. Primary Wound Dressing Wound #3 Right Abdomen - Lower Quadrant Collagen - moisten with hydrogel Wound #4 Right Flank Calcium Alginate with Silver Secondary Dressing Wound #3 Right Abdomen - Lower Quadrant Dry Gauze ABD pad - secure with tape Wound #4 Right Flank ABD pad Drawtex Off-Loading Turn and reposition every 2 hours Other: - abdominal binder to support abdomen. Patient to float right heel while in chair or bed with pillow. Patient to keep pressure off right heel as much as possible. Roll up a face cloth or a pillowcase to separate all skin folds- under breast, abdomen, both flank areas. Additional Orders / Instructions Other: - ***[facility] INTERDRY 10"x 12 feet roll or patient to purchase offline from [employer].*** [place] [facility] for wound care. - [facility] change once a week on mondays when patient comes to wound center on every other Thursdays. Home Health to change twice a  week every other week Mondays and Thursdays when patient is not at wound center. Patient Medications Allergies: Flexeril, morphine, penicillin Notifications Medication Indication Start End nystatin 01/24/2019 DOSE topical 100,000 unit/gram powder - powder topical to affected areas bid Electronic Signature(s) Signed: 01/24/2019 11:43:38 AM By: Linton Ham MD Entered By: Linton Ham on 01/24/2019 11:43:37 -------------------------------------------------------------------------------- Problem List Details Patient Name: Date of Service: [name]. 01/24/2019 10:45 AM Medical Record [id number] Patient Account Number: [id number] Date of Birth/Sex: Treating RN: 08/08/1955 (63 y.o. [name] Primary Care Provider: SYSTEM, PCP Other Clinician: Referring Provider: Treating Provider/Extender:, Dulcy Fanny, Fuller Plan in Treatment: 82 Active Problems ICD-10 Evaluated Encounter Code Description Active Date Today Diagnosis T81.31XD Disruption of external operation (surgical) wound, not 06/29/2017 No Yes elsewhere classified, subsequent encounter S31.104D Unspecified open wound of abdominal wall, left lower 06/29/2017 No Yes quadrant without penetration into peritoneal cavity, subsequent encounter L98.498 Non-pressure chronic ulcer of skin of other sites with 06/14/2018 No Yes other specified severity I89.0 Lymphedema, not elsewhere classified 08/30/2018 No Yes B35.4 Tinea corporis 08/30/2018 No Yes Inactive Problems ICD-10 Code Description Active Date Inactive Date M71.011 Abscess of bursa, right shoulder 06/29/2017 06/29/2017 L89.616 Pressure-induced deep tissue damage of right heel 06/07/2018 06/07/2018 L89.150 Pressure ulcer of sacral region, unstageable 06/14/2018 06/14/2018 Resolved Problems Electronic Signature(s) Signed: 01/25/2019 7:57:15 AM By: Linton Ham MD Entered By: Linton Ham on 01/24/2019  12:03:11 -------------------------------------------------------------------------------- Progress Note Details Patient Name: Date of Service: [name]. 01/24/2019 10:45 AM Medical Record [id number] Patient Account Number: [id number] Date of  Birth/Sex: Treating RN: 02-28-1955 (63 y.o. F) Primary Care Provider: SYSTEM, PCP Other Clinician: Referring Provider: Treating Provider/Extender:, Dulcy Fanny, Fuller Plan in Treatment: 82 Subjective History of Present Illness (HPI) 06/29/17; this is a 63 year old woman who is a poor historian. It appears that she's had limited medical care over the last 2 years or so and what she had his through the [place]. She is here for review of a draining area on her right shoulder anteriorly as well as a nonhealing surgical wound in the left lower abdomen. Per her description she had a hysterectomy in 2006 4 "tumors". About 9 months later she had have another operation in the same area because of "tumors" involving her fallopian tubes and ovary. Sounds as though the surgical wound in this area that took a long time to heal. Apparently the area on her left lower abdomen is been open for the last 6 or 7 months. She is been applying topical antibiotics. 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 She had a fall in late 2018 and the open area on top of her shoulder. She is had draining pus coming out of this wound since she's been applying topical antibiotics and a Band-Aid. She is not complaining of a lot of pain or fever although she did have chills today. She saw her doctor at the [place] yesterday she doesn't know her name she apparently was referred to a surgeon at the [facility] and [place] presumably an orthopedic surgeon to look at that shoulder. She also has a primary doctor in [place] by the name of Dr. Jimmye Norman who referred her here after seeing her  within the last month. 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 We don't have a lot of information in her in Epic. Last hospitalization was from 11/21/16 through 11/24/16 with hypoglycemia felt to be secondary to sulfonylureas. There is no mention of a wound at that time. 07/07/17; patient's shoulder cultured MSSA. I put her on clindamycin to had some anaerobic coverage with caution about diarrhea. I had asked her to make an orthopedic follow-up at the [place] with a doctor that made the original surgery but she has not done that yet. ooShe also has a fairly large wound in the left lower abdomen in the middle of his surgical scar. This was apparently gynecologic surgery perhaps fibroid surgery. In any case these wounds are larger today. 07/21/17; the patient went to see the orthopedic doctor at the [facility] who did her original surgery surgery. Per the patient's description the draining area had closed down. He didn't think anything needed to be done there were no imaging studies. ooThe lower abdominal wound in the left mid pelvis area also looks somewhat better to me. There is less surface material on this still some probing areas but not a lot of depth. We've been using silver alginate 08/03/17; the patient arrives back in 2 week follow-up. The draining sinus on her right shoulder has not reopened although she is complaining of widespread shoulder and other joint pain. She is using silver alginate to the remaining surgical wound on her abdomen. 08/18/17; the patient has 2 week follow-up appointment. The draining sinus on her right shoulder is not reopened. She is complaining of left shoulder pain today. The presumed surgical wound on her lower abdomen looks quite a bit better and this is really contracted quite nicely. When she first came  in the deep sinus she had on the anterior shoulder cultured MSSA. I gave her clindamycin. I arranged for  orthopedic follow-up at the [facility] however this is already closed nevertheless this was a deep probing tunnel and I think it needs to be watched for reopening. 09/07/17; surgical wound on her lower abdomen. This as 2 attached leaflets. The larger one is superiorly. A small probing area here with some sanguinous drainage. Surface needed debridement today. We've been using silver alginate however I changed this to silver collagen today 09/15/17. Surgical wound on her lower abdomen. No recent improvement although this is markedly improved from when we first started seeing her although the original problem was a wound on her right shoulder which is long since closed.switched her to Silver collagen last week She arrives in clinic today complaining of abdominal pain "so bad I almost went to the ER". She tells me she had an endoscopy and colonoscopy about a week ago. This was at the [place], I have no way to look at this. She is unaware of any biopsy was done 10/02/17 on evaluation today patient continues to do well in regard to her abdominal wound were using Prisma it's obviously taken some time for this to heal but nonetheless I do think that she is making good progress currently. There does not appear to be any evidence of infection at this time which is good news. She still does have some tunneling or undermining but again I think this is still doing well. 10/19/17; she continues to make good progress in this surgical abdominal wound using Prisma. This is come in nicely at one point had considerable depth. No evidence of infection. The patient is not complaining of pain in this area but is complaining of diabetic neuropathy pain in her right foot 11/02/17;the patient's wound is superficial and is really continued to make decent progress. I thought this would be more problematic when I first saw however this appears to a filled in nicely there is no depth. It exists in the crevice between what I think is lower  abdominal hernias. She comes in complaining of generalized pain however she is running out of her narcotics providedto her by a local pain clinic. She is asking for home health however she is [place] as I understand we cannot order this. 11/16/2017; 2-week follow-up. She has a lower abdominal surgical wound and this appears to be making nice progress closing in nicely each time we see it. This was initially a deep crevice surgical area between what I think is to lower abdominal hernias. In spite of this wound has healthy granulation. There does not appear to be any depth at all. There is probably a lot of tensile stress on the wound area however in spite of this things actually look better 11/30/2017; 2-week follow-up. She is complaining of left lower quadrant abdominal pain it is difficult to characterize this clearly episodic and not really associated with nausea or vomiting. She follows at the [place]. Tells me she was recently seen there by her new primary physician. The wound is in the lower abdomen in the middle of what I think was a gynecologic surgical site. We have made progress with this. 12/14/2017; 2-week follow-up. The patient comes in today with an improved wound area on the lower mid abdomen surgical scar which was the wound we have been following her for. Paradoxically she has developed a new open area just to the right of the midline almost a mirror imaging wound on  the right. She states this came up about a week ago. She does not have a clear explanation. Both of these wounds are in surgical scar areas although from her I think this surgery was remote. She denies scratching this or traumatizing it. It is surrounded by 2 large areas that I suspect are herniations but I still cannot see how this would have come from trauma. 12/28/2017; the area on the left surgical scar in the lower abdomen is better however the area on the right that we identified last time is worse. Increase in dimensions.  There is no obvious infection here. We have been using silver alginate. She tells me she has been to the ER at the [place] and they gave her doxycycline she also saw her primary doctor yesterday. I think she needs to be referred to dermatology consideration of a skin biopsy question panniculitis 01/11/2018; we continue to have improvement in the left lower mid abdominal wound however the area on the right that we identified in late October has gotten quite a bit worse. There is not appear to be surrounding infection although the patient is complaining of a lot of pain. We have been using silver alginate. [facility] I think this should help 01/24/18 on evaluation today patient actually apparently has two abdominal wounds the one on the right is new here and unfortunately seems to be doing a little bit worse and that it's a little bit larger. Fortunately there does not appear to be any sign of infection at this time. No fevers, chills, nausea, or vomiting noted at this time. Overall again it sounds like this is a fairly complex issue and despite the fact that we been using silver nitrate in the [facility] Dressing still has some hyper granulation. No fevers, chills, nausea, or vomiting noted at this time. 01/31/18 plan evaluation today patient appears to be doing better in general in regard to her wounds. She has been tolerating the dressing changes without complication. I do feel like that right now she's making some progress so she still is having some discomfort. 02/07/18 on evaluation today patient actually appears to be doing rather well in regard to her double ulcers as far as I'm concerned. With that being said she is somewhat frustrated in general in regard to the overall appearance of everything. I feel like this is unwarranted however since she seems to be making progress. 02/15/2018; lower mid abdomen right much greater than left  although the left was the original wound. The area on the left looks really quite good and is smaller. The area on the right seems longer and measures worse in terms of dimensions. These areas are in the most dependent part of her large abdominal pannus and this may be the entire issue. at [facility] on the left there is lymphedema around the wound. The patient is seeing dermatology next week apparently in [facility] 03/01/2017; she likely had abdomen. Right now he other than left ankle both he has better today. I think both of these areas are smaller. Surface looks healthier she is due to see dermatology later this month. Using [facility] 1/23; abdominal wounds in the lower mid abdomen. This is in a very dependent area in her lower abdominal pannus and the simple tension in this area may have a lot to do with this. She also has constant chronic pruritus and scratching and I wonder if this has something to do with open wounds  in this area. She tells me her daughter is helping her with the dressings currently and the original wound on the left side of her mid abdomen is better almost closed the larger area on the right also looks some better. She sees a dermatologist next week I have told her to ask the dermatologist for help with generalized pruritus. The probable skin wounds that she has all over secondary to scratching. The wounds we have been treating may be related to simple tension in a dependent lymphedematous area. She could benefit from an abdominal binder to help support this area 2/13; abdominal wounds in the lower mid abdomen. Very dependent in her abdomen and this may be secondary to tension in the lower part of her abdominal pannus plus or minus edema. 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 She claims not to be scratching at the wound areas. 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 They give her gentian violet to put on the wound area but this is already present and Hydrofera Blue. The original wound on the left is healed today. However the area on the right part of her mid abdominal pannus appears larger. We are going to try to work with her on getting an abdominal binder to help support the weight of the pannus around the wounds 3/9; abdominal wound in the lower mid abdomen. She apparently has an abdominal binder that she bought on [employer]. She is using Hydrofera Blue to the wound on the right of the midline. The original wound was on the left and is closed over. 3/26; abdominal wound in the lower mid abdomen. This is her second wound in this area. The lower part of her abdominal pannus surrounding surgical scar. Once again she is not wearing the abdominal binder she said she obtained. The dressing is Hydrofera Blue 4/16; abdomen in the right lower mid abdomen. This is the second wound in this area the other was a mirror image area on the left. I had her seen by dermatology at the [facility] this was not very helpful. She is not wearing her abdominal binder. We have been using Hydrofera Blue which is what healed at the other side. The abdominal wound is actually larger She arrives in clinic today complaining of the plantar right heel. Very tender. She has been pushing herself up in her bed with her heels and this may be a deep tissue injury. 4/23; abdomen wound is about the same. Spreading along the folds of her pannus somewhat but generally the surface looks somewhat better. She is not using her abdominal binder although she did bring it in this time ooShe arrives in clinic complaining of pain and itching in the right lateral rib area. She has 3 open areas here which are circular wounds. These are new ooShe also mentions her lower sacrum and there are 2  wounds in this area. These are new 5/7; abdominal wound is about the same although in general it looks better than a few weeks ago at which time it had punched out depth. No debridement is required. She will not wear her abdominal binder ooShe has areas on her right lateral rib cage 3 open areas circular wounds these look about the same as last time. I am assuming these have something to do with the  prurigo nodularis that she may have scratched but I am not certain. Finally she has a linear area on her lower sacrum which I am assuming is a pressure area 5/21-Patient returns at 2 weeks for abdominal wound, areas on the right lateral rib cage, and lower sacrum linear area which is a pressure sore. We have been using silver alginate to all these wounds. She has been asked to wear abdominal binder which she is not. she has 2 new wounds on right flank area and on her coccyx. she has a total of 6 wounds 6/9; I have been following this patient for a prolonged period of time for wounds on her lower mid abdomen which I think were secondary to severe lymphedema. She had an area on the left of the midline that eventually healed only to open on the right side. This is still open we have been using silver alginate. She will not use an abdominal binder. About a month ago she arrived here with open areas on the right lateral ribs. This is in the middle of a large amount of soft tissue/pannus fold. She has 4 open areas here at this time. Finally she has 2 open areas in her lower coccyx. We have been using silver alginate on all the wound areas 6/25; we are following this woman for wounds on her lower mid abdomen in the setting of a pannus and severe dependent lymphedema. More recently she has had areas in the folds on the right lateral rib area. Culture I did of this area last time was negative. Finally she has an area on the coccyx. We have been using silver alginate to all the wound areas 7/9; this is a  patient with morbid obesity severe lymphedema in her lower abdominal pannus/severe dependent abdominal lymphedema. She first came to this clinic with an area on the left lower central abdomen. This eventually healed over. About the same time as it did heal over she developed an area in close juxtaposition on the other side of the midline. We have been following this for some months when she was here last time I thought things were improving although she a lot arrives today with a larger wound. She will not use her external abdominal binder. Over the last several weeks she has had areas on her right lateral rib area. The exact etiology of this is unclear we have been using silver alginate. She arrives today with the entire area inflamed angry with loss of surface epithelium and marked pruritus. I think this is tinea/Candida. This could be secondary or the primary in the etiology of these expanding areas. 7/23; the area on the right lateral rib areas is quite a bit better. Skin that we treated with oral and topical antifungals looks a lot better around this area. Unfortunately the lower abdominal area actually measures larger. There is no drainage here really no surrounding erythema. 8/6-Patient arrives to the clinic at 3 weeks, the right lateral rib cage area has copious drainage but the wounds look a little better, the abdominal midline wound unfortunately is larger, she is not able to use the binder both wounds have a clean base 8/20. Since the patient was last here she was hospitalized from 813 through 818. This was predominantly because of hyperkalemia secondary to lisinopril and dehydration. Lisinopril was discontinued at discharge. She was also felt to have mild cellulitis around several skin ulcers she was given antibiotics in the hospital but not discharged on antibiotics. Also noted that she has  stage IV chronic kidney disease with a baseline creatinine at 1.7. 9/3 the patient still has a  2 wound areas lower mid abdominal pannus and the right lateral ribs. We have been using polymen to the abdominal wound and silver alginate to the area on the folds on her right lateral ribs. She is complaining about pruritus around the areas on the ribs. 9/17; 2-week follow-up. The area on the mid lower abdomen better using PolyMem. She has 4 small areas in the pannus fold on the right lateral ribs and is a new area on the left lateral lower abdominal folds this week. 10/1; 2-week follow-up. She has been using PolyMem on the mid lower abdomen and really not much improvement. She has this for small areas in the pannus fold of the right lateral ribs. These are gradually I think improving. The area that was new last time on the left lower abdominal pannus has closed over. 10/15; 2-week follow-up. I changed to silver collagen on the mid lower abdomen wound which seems smaller today. She is still using silver alginate on the right lateral ribs and a pannus fold and this looks better today although she is complaining of itching in this area She has had an outbreak on her face which almost looks like cystic acne. She is going to see her primary doctor about this tomorrow 11/5; silver collagen on the mid lower abdomen which seems to be doing a good job we are using silver alginate in the pannus folds on her right lateral ribs. She is complaining of pruritus in this area which is probably intertrigo/Candida. I will prescribe nystatin powder 12/3. We are using silver collagen to her mid lower abdomen which looks better. The pannus area on the right lateral ribs we are using silver alginate she is pointing nystatin powder in here. The wounds look better. She is separating the folds with hand towels. We have suggested Interdry and she is going to look into getting that online Objective Constitutional Patient is hypotensive. However she appears in her usual state. Pulse regular and within target range for  patient.Marland Kitchen Respirations regular, non-labored and within target range.. Temperature is normal and within the target range for the patient.Marland Kitchen Appears in no distress. Vitals Time Taken: 11:10 AM, Height: 62 in, Weight: 260 lbs, BMI: 47.5, Temperature: 98.3 F, Pulse: 76 bpm, Respiratory Rate: 20 breaths/min, Blood Pressure: 98/49 mmHg. Eyes Conjunctivae clear. No discharge.no icterus. Respiratory work of breathing is normal. Psychiatric appears at normal baseline. General Notes: Wound exam; midline abdomen this is a lot better no debridement is required dimensions are down. She had an area just across the midline here. I have no idea exactly why these are happening except perhaps dependent lymphedema. -The area on the right lateral ribs also looks quite good. There is no evidence of fungal infection here. Integumentary (Hair, Skin) No evidence of fungal infection in the folds on the right ribs. She has numerous healed areas from prurigo nodularis. Wound #3 status is Open. Original cause of wound was Gradually Appeared. The wound is located on the Right Abdomen - Lower Quadrant. The wound measures 6.2cm length x 3cm width x 0.1cm depth; 14.608cm^2 area and 1.461cm^3 volume. There is Fat Layer (Subcutaneous Tissue) Exposed exposed. There is no tunneling or undermining noted. There is a medium amount of serosanguineous drainage noted. The wound margin is distinct with the outline attached to the wound base. There is large (67-100%) red, pink granulation within the wound bed. There is no necrotic tissue  within the wound bed. Wound #4 status is Open. Original cause of wound was Gradually Appeared. The wound is located on the Right Flank. The wound measures 1cm length x 8.8cm width x 0.1cm depth; 6.912cm^2 area and 0.691cm^3 volume. There is Fat Layer (Subcutaneous Tissue) Exposed exposed. There is no tunneling or undermining noted. There is a medium amount of serosanguineous drainage noted. The  wound margin is distinct with the outline attached to the wound base. There is large (67-100%) pink granulation within the wound bed. There is no necrotic tissue within the wound bed. Assessment Active Problems ICD-10 Disruption of external operation (surgical) wound, not elsewhere classified, subsequent encounter Unspecified open wound of abdominal wall, left lower quadrant without penetration into peritoneal cavity, subsequent encounter Non-pressure chronic ulcer of skin of other sites with other specified severity Lymphedema, not elsewhere classified Tinea corporis Plan Follow-up Appointments: Return appointment in 3 weeks. Dressing Change Frequency: Wound #3 Right Abdomen - Lower Quadrant: Other: - change dressing twice a week. Wound #4 Right Flank: Other: - change dressing twice a week. Skin Barriers/Peri-Wound Care: Wound #3 Right Abdomen - Lower Quadrant: Skin Prep Wound #4 Right Flank: Skin Prep Antifungal powder Wound Cleansing: May shower and wash wound with soap and water. Primary Wound Dressing: Wound #3 Right Abdomen - Lower Quadrant: Collagen - moisten with hydrogel Wound #4 Right Flank: Calcium Alginate with Silver Secondary Dressing: Wound #3 Right Abdomen - Lower Quadrant: Dry Gauze ABD pad - secure with tape Wound #4 Right Flank: ABD pad Drawtex Off-Loading: Turn and reposition every 2 hours Other: - abdominal binder to support abdomen. Patient to float right heel while in chair or bed with pillow. Patient to keep pressure off right heel as much as possible. Roll up a face cloth or a pillowcase to separate all skin folds- under breast, abdomen, both flank areas. Additional Orders / Instructions: Other: - ***[facility] with [facility] to [facility] INTERDRY 10"x 12 feet roll or patient to purchase offline from [employer].*** Home Health: [facility] for [facility] - [facility] change once a week on mondays when patient  comes to wound center on every other Thursdays. Home Health to change twice a week every other week Mondays and Thursdays when patient is not at wound center. The following medication(s) was prescribed: nystatin topical 100,000 unit/gram powder powder topical to affected areas bid starting 01/24/2019 1. Right abdomen. I am continuing silver collagen. This continues to look gradually better 2. The area on the right lateral ribs in the pannus fold also improved. We are using silver collagen here 3. I have renewed her nystatin powder with which if nothing else is keeping this area dry 4. We have suggested Interdry for her pannus fold areas Electronic Signature(s) Signed: 01/25/2019 7:57:15 AM By: Linton Ham MD Entered By: Linton Ham on 01/24/2019 12:08:00 -------------------------------------------------------------------------------- SuperBill Details Patient Name: Date of Service: [name] 01/24/2019 Medical Record Number:[id number] Patient Account Number: [id number] Date of Birth/Sex: Treating RN: Aug 11, 1955 (63 y.o. Helene Shoe, Tammi Klippel Primary Care Provider: SYSTEM, PCP Other Clinician: Referring Provider: Treating Provider/Extender:, Dulcy Fanny, Fuller Plan in Treatment: 82 Diagnosis Coding ICD-10 Codes Code Description Disruption of external operation (surgical) wound, not elsewhere classified, subsequent Disruption of external operation (surgical) wound, not elsewhere classified, subsequent T81.31XD encounter Unspecified open wound of abdominal wall, left lower quadrant without penetration into peritoneal S31.104D cavity, subsequent encounter L98.498 Non-pressure chronic ulcer of skin of other sites with other specified severity I89.0 Lymphedema, not elsewhere classified  B35.4 Tinea corporis Facility Procedures The patient participates with Medicare or their insurance follows the Medicare Facility Guidelines: CPT4 Code Description Modifier Quantity  [id number] [place] VISIT-LEV 5 EST PT 1 Physician Procedures CPT4 Description: Code 8473085 [id number] - WC PHYS LEVEL 3 - EST PT ICD-10 Diagnosis Description L98.498 Non-pressure chronic ulcer of skin of other sites with other S31.104D Unspecified open wound of abdominal wall, left lower quadran into peritoneal  cavity, subsequent encounter Modifier: specified sever t without penetr Quantity: 1 ity ation Electronic Signature(s) Signed: 01/25/2019 7:57:15 AM By: Linton Ham MD Entered By: Linton Ham on 01/24/2019 12:08:28

## 2019-01-29 NOTE — Progress Notes (Signed)
[name] [name], [name] [name] ([id number]) Visit Report for 10/25/2018 Arrival Information Details Patient Name: Date of Service: [name] [name], [name] [name] 10/25/2018 11:00 AM Medical Record Number:[id number] Patient Account Number: [id number] Date of Birth/Sex: Treating RN: 1955/04/17 (63 y.o. [name] Primary Care Audrey Eller: SYSTEM, PCP Other Clinician: Referring Anum Palecek: Treating Mehkai Gallo/Extender:Robson, Dulcy Fanny, Fuller Plan in Treatment: 69 Visit Information History Since Last Visit Added or deleted any medications: No Patient Arrived: Wheel Chair Any new allergies or adverse reactions: No Arrival Time: 11:17 Had a fall or experienced change in No activities of daily living that may affect Accompanied By: self risk of falls: Transfer Assistance: Manual Signs or symptoms of abuse/neglect since last No Patient Identification Verified: Yes visito Secondary Verification Process Completed: Yes Hospitalized since last visit: No Patient Requires Transmission-Based No Implantable device outside of the clinic excluding No Precautions: cellular tissue based products placed in the center Patient Has Alerts: No since last visit: Has Dressing in Place as Prescribed: Yes Pain Present Now: Yes Electronic Signature(s) Signed: 01/29/2019 3:04:24 PM By: Sandre Kitty Entered By: Sandre Kitty on 10/25/2018 11:18:16 -------------------------------------------------------------------------------- Clinic Level of Care Assessment Details Patient Name: Date of Service: [name] [name], [name] [name] 10/25/2018 11:00 AM Medical Record Number:[id number] Patient Account Number: [id number] Date of Birth/Sex: Treating RN: 10/08/1955 (63 y.o. Helene Shoe, Tammi Klippel Primary Care Roseanna Koplin: SYSTEM, PCP Other Clinician: Referring Jamieson Hetland: Treating Marcio Hoque/Extender:Robson, Dulcy Fanny, Fuller Plan in Treatment: 69 Clinic Level of Care Assessment Items TOOL 4 Quantity Score X - Use when only an EandM  is performed on FOLLOW-UP visit 1 0 ASSESSMENTS - Nursing Assessment / Reassessment X - Reassessment of Co-morbidities (includes updates in patient status) 1 10 X - Reassessment of Adherence to Treatment Plan 1 5 ASSESSMENTS - Wound and Skin Assessment / Reassessment []  - Simple Wound Assessment / Reassessment - one wound 0 X - Complex Wound Assessment / Reassessment - multiple wounds 2 5 X - Dermatologic / Skin Assessment (not related to wound area) 1 10 ASSESSMENTS - Focused Assessment []  - Circumferential Edema Measurements - multi extremities 0 X - Nutritional Assessment / Counseling / Intervention 1 10 []  - Lower Extremity Assessment (monofilament, tuning fork, pulses) 0 []  - Peripheral Arterial Disease Assessment (using hand held doppler) 0 ASSESSMENTS - Ostomy and/or Continence Assessment and Care []  - Incontinence Assessment and Management 0 []  - Ostomy Care Assessment and Management (repouching, etc.) 0 PROCESS - Coordination of Care []  - Simple Patient / Family Education for ongoing care 0 X - Complex (extensive) Patient / Family Education for ongoing care 1 20 X - Staff obtains Programmer, systems, Records, Test Results / Process Orders 1 10 []  - Staff telephones HHA, Nursing Homes / Clarify orders / etc 0 []  - Routine Transfer to another Facility (non-emergent condition) 0 []  - Routine Hospital Admission (non-emergent condition) 0 []  - New Admissions / Biomedical engineer / Ordering NPWT, Apligraf, etc. 0 []  - Emergency Hospital Admission (emergent condition) 0 []  - Simple Discharge Coordination 0 X - Complex (extensive) Discharge Coordination 1 15 PROCESS - Special Needs []  - Pediatric / Minor Patient Management 0 []  - Isolation Patient Management 0 []  - Hearing / Language / Visual special needs 0 []  - Assessment of Community assistance (transportation, D/C planning, etc.) 0 []  - Additional assistance / Altered mentation 0 []  - Support Surface(s) Assessment (bed, cushion,  seat, etc.) 0 INTERVENTIONS - Wound Cleansing / Measurement []  - Simple Wound Cleansing - one wound 0 []  - Complex Wound Cleansing - multiple wounds 0 []  - Wound Imaging (  photographs - any number of wounds) 0 []  - Wound Tracing (instead of photographs) 0 []  - Simple Wound Measurement - one wound 0 []  - Complex Wound Measurement - multiple wounds 0 INTERVENTIONS - Wound Dressings []  - Small Wound Dressing one or multiple wounds 0 X - Medium Wound Dressing one or multiple wounds 2 15 []  - Large Wound Dressing one or multiple wounds 0 X - Application of Medications - topical 1 5 []  - Application of Medications - injection 0 INTERVENTIONS - Miscellaneous []  - External ear exam 0 []  - Specimen Collection (cultures, biopsies, blood, body fluids, etc.) 0 []  - Specimen(s) / Culture(s) sent or taken to Lab for analysis 0 []  - Patient Transfer (multiple staff / Civil Service fast streamer / Similar devices) 0 []  - Simple Staple / Suture removal (25 or less) 0 []  - Complex Staple / Suture removal (26 or more) 0 []  - Hypo / Hyperglycemic Management (close monitor of Blood Glucose) 0 []  - Ankle / Brachial Index (ABI) - do not check if billed separately 0 X - Vital Signs 1 5 Has the patient been seen at the hospital within the last three years: Yes Total Score: 130 Level Of Care: New/Established - Level 4 Electronic Signature(s) Signed: 10/25/2018 5:44:03 PM By: Deon Pilling Entered By: Deon Pilling on 10/25/2018 11:52:39 -------------------------------------------------------------------------------- Multi Wound Chart Details Patient Name: Date of Service: [name] Dakin. 10/25/2018 11:00 AM Medical Record Number:[id number] Patient Account Number: [id number] Date of Birth/Sex: Treating RN: Jul 13, 1955 (63 y.o. Helene Shoe, Tammi Klippel Primary Care Normajean Nash: SYSTEM, PCP Other Clinician: Referring Amen Dargis: Treating Fabiola Mudgett/Extender:Robson, Dulcy Fanny, Fuller Plan in Treatment: 69 Vital Signs Height(in):  62 Pulse(bpm): 85 Weight(lbs): 260 Blood Pressure(mmHg): 130/55 Body Mass Index(BMI): 48 Temperature(F): 98.3 Respiratory 22 Rate(breaths/min): Photos: [3:No Photos] [4:No Photos] [N/A:N/A] Wound Location: [3:Right Abdomen - Lower Quadrant] [4:Right Flank] [N/A:N/A] Wounding Event: [3:Gradually Appeared] [4:Gradually Appeared] [N/A:N/A] Primary Etiology: [3:Dehisced Wound] [4:MASD Moisture Associated Skin Damage] [N/A:N/A] Comorbid History: [3:Cataracts, Lymphedema, Cataracts, Lymphedema, N/A Sleep Apnea, Hypertension, Sleep Apnea, Hypertension, Type II Diabetes, End Stage Type II Diabetes, End Stage Renal Disease, Gout, Rheumatoid Arthritis, Neuropathy] [4:Renal Disease,  Gout, Rheumatoid Arthritis, Neuropathy] Date Acquired: [3:12/07/2017] [4:05/31/2018] [N/A:N/A] Weeks of Treatment: [3:45] [4:19] [N/A:N/A] Wound Status: [3:Open] [4:Open] [N/A:N/A] Clustered Wound: [3:No] [4:Yes] [N/A:N/A] Clustered Quantity: [3:N/A] [4:3] [N/A:N/A] Measurements L x W x D 7x8.5x0.1 [4:3x10.2x0.1] [N/A:N/A] (cm) Area (cm) : [3:46.731] [4:24.033] [N/A:N/A] Volume (cm) : [3:4.673] [4:2.403] [N/A:N/A] % Reduction in Area: [3:-621.20%] [4:-195.60%] [N/A:N/A] % Reduction in Volume: 19.90% [4:-195.60%] [N/A:N/A] Classification: [3:Full Thickness Without Exposed Support Structures] [4:N/A] [N/A:N/A] Exudate Amount: [3:Large] [4:Large] [N/A:N/A] Exudate Type: [3:Serosanguineous] [4:Serosanguineous] [N/A:N/A] Exudate Color: [3:red, brown] [4:red, brown] [N/A:N/A] Wound Margin: [3:Flat and Intact] [4:Flat and Intact] [N/A:N/A] Granulation Amount: [3:Large (67-100%)] [4:Large (67-100%)] [N/A:N/A] Granulation Quality: [3:Red, Pink] [4:Red, Pink] [N/A:N/A] Necrotic Amount: [3:Small (1-33%)] [4:None Present (0%)] [N/A:N/A] Exposed Structures: [3:Fat Layer (Subcutaneous Fat Layer (Subcutaneous N/A Tissue) Exposed: Yes Fascia: No Tendon: No Muscle: No Joint: No Bone: No Small (1-33%)] [4:Tissue) Exposed: Yes  Fascia: No Tendon: No Muscle: No Joint: No Bone: No Small (1-33%)] Treatment Notes Electronic Signature(s) Signed: 10/25/2018 5:10:59 PM By: Linton Ham MD Signed: 10/25/2018 5:44:03 PM By: Deon Pilling Entered By: Linton Ham on 10/25/2018 11:47:32 -------------------------------------------------------------------------------- Multi-Disciplinary Care Plan Details Patient Name: Date of Service: [name] Dakin. 10/25/2018 11:00 AM Medical Record Number:[id number] Patient Account Number: [id number] Date of Birth/Sex: Treating RN: 07/05/1955 (63 y.o. [name] Primary Care Yakima Kreitzer: SYSTEM, PCP Other Clinician: Referring Yuliya Nova: Treating Tamica Covell/Extender:Robson, Dulcy Fanny,  Dwight Weeks in Treatment: 69 Active Inactive Wound/Skin Impairment Nursing Diagnoses: Impaired tissue integrity Goals: Patient/caregiver will verbalize understanding of skin care regimen Date Initiated: 09/07/2017 Target Resolution Date: 12/21/2018 Goal Status: Active Ulcer/skin breakdown will have a volume reduction of 30% by week 4 Date Initiated: 06/29/2017 Date Inactivated: 08/04/2017 Target Resolution Date: 09/01/2017 Goal Status: Unmet Unmet Reason: larger Interventions: Assess patient/caregiver ability to obtain necessary supplies Assess patient/caregiver ability to perform ulcer/skin care regimen upon admission and as needed Assess ulceration(s) every visit Provide education on ulcer and skin care Notes: Electronic Signature(s) Signed: 10/25/2018 5:44:03 PM By: Deon Pilling Entered By: Deon Pilling on 10/25/2018 11:28:50 -------------------------------------------------------------------------------- Pain Assessment Details Patient Name: Date of Service: Emberli, Ballester 10/25/2018 11:00 AM Medical Record Number:[id number] Patient Account Number: [id number] Date of Birth/Sex: Treating RN: 20-Apr-1955 (63 y.o. [name] Primary Care Clara Smolen: SYSTEM, PCP Other  Clinician: Referring Raniya Golembeski: Treating Arlynn Stare/Extender:Robson, Dulcy Fanny, Fuller Plan in Treatment: 69 Active Problems Location of Pain Severity and Description of Pain Patient Has Paino Yes Site Locations Rate the pain. Current Pain Level: 7 Pain Management and Medication Current Pain Management: Electronic Signature(s) Signed: 10/25/2018 5:44:03 PM By: Deon Pilling Signed: 01/29/2019 3:04:24 PM By: Sandre Kitty Entered By: Sandre Kitty on 10/25/2018 11:18:51 -------------------------------------------------------------------------------- Patient/Caregiver Education Details Patient Name: Date of Service: Winstead, Nikol M. 9/3/2020andnbsp11:00 AM Medical Record Number:[id number] Patient Account Number: [id number] Date of Birth/Gender: Treating RN: 04-01-1955 (63 y.o. [name] Primary Care Physician: SYSTEM, PCP Other Clinician: Referring Physician: Treating Physician/Extender:Robson, Dulcy Fanny, Fuller Plan in Treatment: 71 Education Assessment Education Provided To: Patient Education Topics Provided Wound/Skin Impairment: Handouts: Caring for Your Ulcer Methods: Explain/Verbal Responses: Reinforcements needed Electronic Signature(s) Signed: 10/25/2018 5:44:03 PM By: Deon Pilling Entered By: Deon Pilling on 10/25/2018 11:29:12 -------------------------------------------------------------------------------- Wound Assessment Details Patient Name: Date of Service: Gessica, Jawad. 10/25/2018 11:00 AM Medical Record Number:[id number] Patient Account Number: [id number] Date of Birth/Sex: Treating RN: 07-17-1955 (63 y.o. Helene Shoe, Tammi Klippel Primary Care Martita Brumm: SYSTEM, PCP Other Clinician: Referring Adrinne Sze: Treating Keyia Moretto/Extender:Robson, Dulcy Fanny, Fuller Plan in Treatment: 69 Wound Status Wound Number: 3 Primary Dehisced Wound Etiology: Wound Location: Right Abdomen - Lower Quadrant Wound Open Wounding Event: Gradually  Appeared Status: Date Acquired: 12/07/2017 Comorbid Cataracts, Lymphedema, Sleep Apnea, Weeks Of Treatment: 45 History: Hypertension, Type II Diabetes, End Stage Clustered Wound: No Renal Disease, Gout, Rheumatoid Arthritis, Neuropathy Photos Wound Measurements Length: (cm) 7 % Reduct Width: (cm) 8.5 % Reduct Depth: (cm) 0.1 Epitheli Area: (cm) 46.731 Tunneli Volume: (cm) 4.673 Undermi Wound Description Classification: Full Thickness Without Exposed Support Classification: Structures Wound Flat and Intact Margin: Exudate Large Amount: Exudate Serosanguineous Type: Exudate red, brown Color: Wound Bed Granulation Amount: Large (67-100%) Granulation Quality: Red, Pink Necrotic Amount: Small (1-33%) Necrotic Quality: Adherent Slough Foul Odor After Cleansing: No Slough/Fibrino No Exposed Structure Fascia Exposed: No Fat Layer (Subcutaneous Tissue) Exposed: Yes Tendon Exposed: No Muscle Exposed: No Joint Exposed: No Bone Exposed: No ion in Area: -621.2% ion in Volume: 19.9% alization: Small (1-33%) ng: No ning: No Electronic Signature(s) Signed: 10/26/2018 4:32:29 PM By: Mikeal Hawthorne EMT/HBOT Signed: 10/26/2018 6:15:35 PM By: Deon Pilling Previous Signature: 10/25/2018 5:44:03 PM Version By: Deon Pilling Entered By: Mikeal Hawthorne on 10/26/2018 09:33:49 -------------------------------------------------------------------------------- Wound Assessment Details Patient Name: Date of Service: [name] Dakin. 10/25/2018 11:00 AM Medical Record [id number] Patient Account Number: [id number] Date of Birth/Sex: Treating RN: 1955/12/14 (63 y.o. [name] Primary Care Jaquavius Hudler: SYSTEM, PCP Other Clinician: Referring Deakon Frix: Treating Yessenia Maillet/Extender:Robson, Dulcy Fanny, Fuller Plan in  Treatment: 69 Wound Status Wound Number: 4 Primary MASD Moisture Associated Skin Damage Etiology: Wound Location: Right Flank Wound Open Wounding Event:  Gradually Appeared Status: Date Acquired: 05/31/2018 Comorbid Cataracts, Lymphedema, Sleep Apnea, Weeks Of Treatment: 19 History: Hypertension, Type II Diabetes, End Stage Clustered Wound: Yes Renal Disease, Gout, Rheumatoid Arthritis, Neuropathy Photos Wound Measurements Length: (cm) 3 % Reducti Width: (cm) 10.2 % Reducti Depth: (cm) 0.1 Epithelia Clustered Quantity: 3 Tunneling Area: (cm) 24.033 Undermin Volume: (cm) 2.403 Wound Description Wound Margin: Flat and Intact Exudate Amount: Large Exudate Type: Serosanguineous Exudate Color: red, brown Wound Bed Granulation Amount: Large (67-100%) Granulation Quality: Red, Pink Necrotic Amount: None Present (0%) Foul Odor After Cleansing: No Slough/Fibrino No Exposed Structure Fascia Exposed: No Fat Layer (Subcutaneous Tissue) Exposed: Yes Tendon Exposed: No Muscle Exposed: No Joint Exposed: No Bone Exposed: No on in Area: -195.6% on in Volume: -195.6% lization: Small (1-33%) : No ing: No Electronic Signature(s) Signed: 10/26/2018 4:32:29 PM By: Mikeal Hawthorne EMT/HBOT Signed: 10/26/2018 6:15:35 PM By: Deon Pilling Previous Signature: 10/25/2018 5:44:03 PM Version By: Deon Pilling Entered By: Mikeal Hawthorne on 10/26/2018 09:33:26 -------------------------------------------------------------------------------- Vitals Details Patient Name: Date of Service: [name] Dakin. 10/25/2018 11:00 AM Medical Record Number:[id number] Patient Account Number: [id number] Date of Birth/Sex: Treating RN: 01-14-1956 (63 y.o. [name] Primary Care Charleene Callegari: SYSTEM, PCP Other Clinician: Referring Asaad Gulley: Treating Deadrian Toya/Extender:Robson, Dulcy Fanny, Fuller Plan in Treatment: 69 Vital Signs Time Taken: 11:18 Temperature (F): 98.3 Height (in): 62 Pulse (bpm): 85 Weight (lbs): 260 Respiratory Rate (breaths/min): 22 Body Mass Index (BMI): 47.5 Blood Pressure (mmHg): 130/55 Reference Range: 80 - 120 mg /  dl Electronic Signature(s) Signed: 01/29/2019 3:04:24 PM By: Sandre Kitty Entered By: Sandre Kitty on 10/25/2018 11:18:40

## 2019-01-29 NOTE — Progress Notes (Signed)
TANVI, GATLING ([id number]) Visit Report for 12/27/2018 Arrival Information Details Patient Name: Date of Service: [name] [name], [name] [name] 12/27/2018 10:45 AM Medical Record Number:[id number] Patient Account Number: [id number] Date of Birth/Sex: Treating RN: [birth year] (63 y.o. Helene Shoe, Tammi Klippel Primary Care Kristine Chahal: SYSTEM, PCP Other Clinician: Referring Aashrith Eves: Treating Dian Laprade/Extender:Robson, Dulcy Fanny, Fuller Plan in Treatment: 78 Visit Information History Since Last Visit Added or deleted any medications: No Patient Arrived: Wheel Chair Any new allergies or adverse reactions: No Arrival Time: 11:58 Had a fall or experienced change in No activities of daily living that may affect Accompanied By: sister risk of falls: Transfer Assistance: None Signs or symptoms of abuse/neglect since last No Patient Identification Verified: Yes visito Secondary Verification Process Completed: Yes Hospitalized since last visit: No Patient Requires Transmission-Based No Implantable device outside of the clinic excluding No Precautions: cellular tissue based products placed in the center Patient Has Alerts: No since last visit: Has Dressing in Place as Prescribed: Yes Pain Present Now: Yes Electronic Signature(s) Signed: 01/29/2019 3:00:22 PM By: Sandre Kitty Entered By: Sandre Kitty on 12/27/2018 11:58:50 -------------------------------------------------------------------------------- Clinic Level of Care Assessment Details Patient Name: Date of Service: [name] [name], [name] [name] 12/27/2018 10:45 AM Medical Record Number:[id number] Patient Account Number: [id number] Date of Birth/Sex: Treating RN: 05/12/1955 (63 y.o. Helene Shoe, Tammi Klippel Primary Care Ciella Obi: SYSTEM, PCP Other Clinician: Referring Annaleigh Steinmeyer: Treating Sekai Nayak/Extender:Robson, Dulcy Fanny, Fuller Plan in Treatment: 78 Clinic Level of Care Assessment Items TOOL 4 Quantity Score X - Use when only an  EandM is performed on FOLLOW-UP visit 1 0 ASSESSMENTS - Nursing Assessment / Reassessment X - Reassessment of Co-morbidities (includes updates in patient status) 1 10 X - Reassessment of Adherence to Treatment Plan 1 5 ASSESSMENTS - Wound and Skin Assessment / Reassessment []  - Simple Wound Assessment / Reassessment - one wound 0 X - Complex Wound Assessment / Reassessment - multiple wounds 2 5 X - Dermatologic / Skin Assessment (not related to wound area) 1 10 ASSESSMENTS - Focused Assessment []  - Circumferential Edema Measurements - multi extremities 0 X - Nutritional Assessment / Counseling / Intervention 1 10 []  - Lower Extremity Assessment (monofilament, tuning fork, pulses) 0 []  - Peripheral Arterial Disease Assessment (using hand held doppler) 0 ASSESSMENTS - Ostomy and/or Continence Assessment and Care []  - Incontinence Assessment and Management 0 []  - Ostomy Care Assessment and Management (repouching, etc.) 0 PROCESS - Coordination of Care []  - Simple Patient / Family Education for ongoing care 0 X - Complex (extensive) Patient / Family Education for ongoing care 1 20 X - Staff obtains Programmer, systems, Records, Test Results / Process Orders 1 10 X - Staff telephones HHA, Nursing Homes / Clarify orders / etc 1 10 []  - Routine Transfer to another Facility (non-emergent condition) 0 []  - Routine Hospital Admission (non-emergent condition) 0 []  - New Admissions / Biomedical engineer / Ordering NPWT, Apligraf, etc. 0 []  - Emergency Hospital Admission (emergent condition) 0 []  - Simple Discharge Coordination 0 X - Complex (extensive) Discharge Coordination 1 15 PROCESS - Special Needs []  - Pediatric / Minor Patient Management 0 []  - Isolation Patient Management 0 []  - Hearing / Language / Visual special needs 0 []  - Assessment of Community assistance (transportation, D/C planning, etc.) 0 []  - Additional assistance / Altered mentation 0 []  - Support Surface(s) Assessment (bed,  cushion, seat, etc.) 0 INTERVENTIONS - Wound Cleansing / Measurement []  - Simple Wound Cleansing - one wound 0 X - Complex Wound Cleansing - multiple wounds 2 5 X -  Wound Imaging (photographs - any number of wounds) 1 5 []  - Wound Tracing (instead of photographs) 0 []  - Simple Wound Measurement - one wound 0 X - Complex Wound Measurement - multiple wounds 2 5 INTERVENTIONS - Wound Dressings []  - Small Wound Dressing one or multiple wounds 0 X - Medium Wound Dressing one or multiple wounds 2 15 []  - Large Wound Dressing one or multiple wounds 0 []  - Application of Medications - topical 0 []  - Application of Medications - injection 0 INTERVENTIONS - Miscellaneous []  - External ear exam 0 []  - Specimen Collection (cultures, biopsies, blood, body fluids, etc.) 0 []  - Specimen(s) / Culture(s) sent or taken to Lab for analysis 0 []  - Patient Transfer (multiple staff / Civil Service fast streamer / Similar devices) 0 []  - Simple Staple / Suture removal (25 or less) 0 []  - Complex Staple / Suture removal (26 or more) 0 []  - Hypo / Hyperglycemic Management (close monitor of Blood Glucose) 0 []  - Ankle / Brachial Index (ABI) - do not check if billed separately 0 X - Vital Signs 1 5 Has the patient been seen at the hospital within the last three years: Yes Total Score: 160 Level Of Care: New/Established - Level 5 Electronic Signature(s) Signed: 12/27/2018 5:30:28 PM By: Deon Pilling Entered By: Deon Pilling on 12/27/2018 14:15:18 -------------------------------------------------------------------------------- Lower Extremity Assessment Details Patient Name: Date of Service: [name] [name], [name] [name] 12/27/2018 10:45 AM Medical Record [id number] Patient Account Number: [id number] Date of Birth/Sex: Treating RN: 10-26-1955 (63 y.o. [name] Primary Care Bethzaida Boord: SYSTEM, PCP Other Clinician: Referring Enjoli Tidd: Treating Sibel Khurana/Extender:Robson, Dulcy Fanny, Fuller Plan in Treatment:  66 Electronic Signature(s) Signed: 12/27/2018 5:16:49 PM By: Kela Millin Entered By: Kela Millin on 12/27/2018 12:25:52 -------------------------------------------------------------------------------- Multi Wound Chart Details Patient Name: Date of Service: [name] [name]. 12/27/2018 10:45 AM Medical Record [id number] Patient Account Number: [id number] Date of Birth/Sex: Treating RN: 1956-02-10 (63 y.o. [name] Primary Care Kasi Lasky: SYSTEM, PCP Other Clinician: Referring Dillan Lunden: Treating Tallia Moehring/Extender:Robson, Dulcy Fanny, Fuller Plan in Treatment: 78 Vital Signs Height(in): 62 Pulse(bpm): 70 Weight(lbs): 260 Blood Pressure(mmHg): 114/47 Body Mass Index(BMI): 48 Temperature(F): 98.2 Respiratory 18 Rate(breaths/min): Photos: [3:No Photos] [4:No Photos] [N/A:N/A] Wound Location: [3:Right Abdomen - Lower Quadrant] [4:Right Flank] [N/A:N/A] Wounding Event: [3:Gradually Appeared] [4:Gradually Appeared] [N/A:N/A] Primary Etiology: [3:Dehisced Wound] [4:MASD Moisture Associated Skin Damage] [N/A:N/A] Comorbid History: [3:Cataracts, Lymphedema, Cataracts, Lymphedema, N/A Sleep Apnea, Hypertension, Sleep Apnea, Hypertension, Type II Diabetes, End Stage Type II Diabetes, End Stage Renal Disease, Gout, Rheumatoid Arthritis, Neuropathy] [4:Renal Disease,  Gout, Rheumatoid Arthritis, Neuropathy] Date Acquired: [3:12/07/2017] [4:05/31/2018] [N/A:N/A] Weeks of Treatment: [3:54] [4:28] [N/A:N/A] Wound Status: [3:Open] [4:Open] [N/A:N/A] Clustered Wound: [3:No] [4:Yes] [N/A:N/A] Clustered Quantity: [3:N/A] [4:3] [N/A:N/A] Measurements L x W x D [3:6.2x3.4x0.1] [4:1.1x10.5x0.2] [N/A:N/A] (cm) Area (cm) : [3:16.556] [4:9.071] [N/A:N/A] Volume (cm) : [3:1.656] [4:1.814] [N/A:N/A] % Reduction in Area: [3:-155.50%] [4:-11.60%] [N/A:N/A] % Reduction in Volume: [3:71.60%] [4:-123.10%] [N/A:N/A] Classification: [3:Full Thickness Without Exposed Support  Structures] [4:N/A] [N/A:N/A] Exudate Amount: [3:Medium] [4:Medium] [N/A:N/A] Exudate Type: [3:Serosanguineous] [4:Serosanguineous] [N/A:N/A] Exudate Color: [3:red, brown] [4:red, brown] [N/A:N/A] Wound Margin: [3:Distinct, outline attached Distinct, outline attached] [N/A:N/A] Granulation Amount: [3:Large (67-100%)] [4:Large (67-100%)] [N/A:N/A] Granulation Quality: [3:Red, Pink] [4:Red, Pink] [N/A:N/A] Necrotic Amount: [3:Small (1-33%)] [4:None Present (0%)] [N/A:N/A] Exposed Structures: [3:Fat Layer (Subcutaneous Fat Layer (Subcutaneous Tissue) Exposed: Yes Fascia: No Tendon: No Muscle: No Joint: No Bone: No Small (1-33%)] [4:Tissue) Exposed: Yes Fascia: No Tendon: No Muscle: No Joint: No Bone: No Small (1-33%)] [N/A:N/A N/A] Treatment  Notes Electronic Signature(s) Signed: 12/27/2018 5:30:28 PM By: Deon Pilling Signed: 12/27/2018 5:45:12 PM By: Linton Ham MD Entered By: Linton Ham on 12/27/2018 13:14:55 -------------------------------------------------------------------------------- [place] Details Patient Name: Date of Service: [name] [name], [name] [name] 12/27/2018 10:45 AM Medical Record Number:[id number] Patient Account Number: [id number] Date of Birth/Sex: Treating RN: [birth year] (63 y.o. [name] Primary Care Tieara Flitton: SYSTEM, PCP Other Clinician: Referring Jermya Dowding: Treating Emmalene Kattner/Extender:Robson, Dulcy Fanny, Fuller Plan in Treatment: 78 Active Inactive Wound/Skin Impairment Nursing Diagnoses: Impaired tissue integrity Goals: Patient/caregiver will verbalize understanding of skin care regimen Date Initiated: 09/07/2017 Target Resolution Date: 01/18/2019 Goal Status: Active Ulcer/skin breakdown will have a volume reduction of 30% by week 4 Date Initiated: 06/29/2017 Date Inactivated: 08/04/2017 Target Resolution Date: 09/01/2017 Goal Status: Unmet Unmet Reason: larger Interventions: Assess patient/caregiver ability to obtain  necessary supplies Assess patient/caregiver ability to perform ulcer/skin care regimen upon admission and as needed Assess ulceration(s) every visit Provide education on ulcer and skin care Notes: Electronic Signature(s) Signed: 12/27/2018 5:30:28 PM By: Deon Pilling Entered By: Deon Pilling on 12/27/2018 12:02:36 -------------------------------------------------------------------------------- Pain Assessment Details Patient Name: Date of Service: [name] [name], [name] [name] 12/27/2018 10:45 AM Medical Record [id number] Patient Account Number: [id number] Date of Birth/Sex: Treating RN: May 02, 1955 (63 y.o. [name] Primary Care Townes Fuhs: SYSTEM, PCP Other Clinician: Referring Emiah Pellicano: Treating Robyn Nohr/Extender:Robson, Dulcy Fanny, Fuller Plan in Treatment: 78 Active Problems Location of Pain Severity and Description of Pain Patient Has Paino Yes Site Locations Rate the pain. Current Pain Level: 5 Pain Management and Medication Current Pain Management: Electronic Signature(s) Signed: 12/27/2018 5:30:28 PM By: Deon Pilling Signed: 01/29/2019 3:00:22 PM By: Sandre Kitty Entered By: Sandre Kitty on 12/27/2018 12:07:45 -------------------------------------------------------------------------------- Patient/Caregiver Education Details Gonzaga, Betha 11/5/2020andnbsp10:45 Patient Name: Date of Service: M. AM Medical Record Patient Account Number: [id number] Number: Treating RN: Deon Pilling Date of Birth/Gender: [birth year] (63 y.o. F) Other Clinician: Primary Care Physician: SYSTEM, PCP Treating Linton Ham Referring Physician: Physician/Extender: Dionisio Paschal in Treatment: 2 Education Assessment Education Provided To: Patient Education Topics Provided Wound/Skin Impairment: Handouts: Skin Care Do's and Dont's Methods: Explain/Verbal Responses: Reinforcements needed Electronic Signature(s) Signed: 12/27/2018 5:30:28 PM  By: Deon Pilling Entered By: Deon Pilling on 12/27/2018 12:02:47 -------------------------------------------------------------------------------- Wound Assessment Details Patient Name: Date of Service: [name] [name], [name] [name] 12/27/2018 10:45 AM Medical Record [id number] Patient Account Number: [id number] Date of Birth/Sex: Treating RN: 02-10-1956 (63 y.o. Helene Shoe, Tammi Klippel Primary Care Amaru Burroughs: SYSTEM, PCP Other Clinician: Referring Nitasha Jewel: Treating Mir Fullilove/Extender:Robson, Dulcy Fanny, Fuller Plan in Treatment: 78 Wound Status Wound Number: 3 Primary Dehisced Wound Etiology: Wound Location: Right Abdomen - Lower Quadrant Wound Open Wounding Event: Gradually Appeared Status: Date Acquired: 12/07/2017 Comorbid Cataracts, Lymphedema, Sleep Apnea, Weeks Of Treatment: 54 History: Hypertension, Type II Diabetes, End Stage Clustered Wound: No Renal Disease, Gout, Rheumatoid Arthritis, Neuropathy Photos Wound Measurements Length: (cm) 6.2 % Reduct Width: (cm) 3.4 % Reduct Depth: (cm) 0.1 Epitheli Area: (cm) 16.556 Tunneli Volume: (cm) 1.656 Undermi Wound Description Classification: Full Thickness Without Exposed Support Foul Odo Structures Slough/F Wound Distinct, outline attached Margin: Exudate Medium Amount: Exudate Serosanguineous Type: Exudate red, brown Color: Wound Bed Granulation Amount: Large (67-100%) Granulation Quality: Red, Pink Fascia E Necrotic Amount: Small (1-33%) Fat Laye Necrotic Quality: Adherent Slough Tendon E Muscle E Joint Ex Bone Exp r After Cleansing: No ibrino Yes Exposed Structure xposed: No r (Subcutaneous Tissue) Exposed: Yes xposed: No xposed: No posed: No osed: No ion in Area: -155.5% ion in Volume: 71.6% alization: Small (1-33%) ng: No ning: No  Electronic Signature(s) Signed: 12/31/2018 3:33:55 PM By: Deon Pilling Signed: 12/31/2018 4:06:15 PM By: Mikeal Hawthorne EMT/HBOT Previous Signature: 12/27/2018  5:16:49 PM Version By: Kela Millin Previous Signature: 12/27/2018 5:30:28 PM Version By: Deon Pilling Entered By: Mikeal Hawthorne on 12/31/2018 09:20:42 -------------------------------------------------------------------------------- Wound Assessment Details Patient Name: Date of Service: [name] [name], [name] [name] 12/27/2018 10:45 AM Medical Record [id number] Patient Account Number: [id number] Date of Birth/Sex: Treating RN: 1955-04-27 (63 y.o. Helene Shoe, Tammi Klippel Primary Care Jesselle Laflamme: SYSTEM, PCP Other Clinician: Referring Zyairah Wacha: Treating Viha Kriegel/Extender:Robson, Dulcy Fanny, Fuller Plan in Treatment: 78 Wound Status Wound Number: 4 Primary MASD Moisture Associated Skin Damage Etiology: Wound Location: Right Flank Wound Open Wounding Event: Gradually Appeared Status: Date Acquired: 05/31/2018 Comorbid Cataracts, Lymphedema, Sleep Apnea, Weeks Of Treatment: 28 History: Hypertension, Type II Diabetes, End Stage Clustered Wound: Yes Renal Disease, Gout, Rheumatoid Arthritis, Neuropathy Photos Wound Measurements Length: (cm) 1.1 % Reducti Width: (cm) 10.5 % Reducti Depth: (cm) 0.2 Epithelia Clustered Quantity: 3 Tunneling Area: (cm) 9.071 Undermin Volume: (cm) 1.814 Wound Description Wound Margin: Distinct, outline attached Exudate Amount: Medium Exudate Type: Serosanguineous Exudate Color: red, brown Wound Bed Granulation Amount: Large (67-100%) Granulation Quality: Red, Pink Necrotic Amount: None Present (0%) Foul Odor After Cleansing: No Slough/Fibrino No Exposed Structure Fascia Exposed: No Fat Layer (Subcutaneous Tissue) Exposed: Yes Tendon Exposed: No Muscle Exposed: No Joint Exposed: No Bone Exposed: No on in Area: -11.6% on in Volume: -123.1% lization: Small (1-33%) : No ing: No Electronic Signature(s) Signed: 12/31/2018 3:33:55 PM By: Deon Pilling Signed: 12/31/2018 4:06:15 PM By: Mikeal Hawthorne EMT/HBOT Previous Signature: 12/27/2018  5:16:49 PM Version By: Kela Millin Previous Signature: 12/27/2018 5:30:28 PM Version By: Deon Pilling Entered By: Mikeal Hawthorne on 12/31/2018 09:20:09 -------------------------------------------------------------------------------- Vitals Details Patient Name: Date of Service: [name] [name]. 12/27/2018 10:45 AM Medical Record [id number] Patient Account Number: [id number] Date of Birth/Sex: Treating RN: Dec 08, 1955 (63 y.o. [name] Primary Care Elverda Wendel: SYSTEM, PCP Other Clinician: Referring Deletha Jaffee: Treating Florine Sprenkle/Extender:Robson, Dulcy Fanny, Fuller Plan in Treatment: 78 Vital Signs Time Taken: 11:58 Temperature (F): 98.2 Height (in): 62 Pulse (bpm): 70 Weight (lbs): 260 Respiratory Rate (breaths/min): 18 Body Mass Index (BMI): 47.5 Blood Pressure (mmHg): 114/47 Reference Range: 80 - 120 mg / dl Electronic Signature(s) Signed: 01/29/2019 3:00:22 PM By: Sandre Kitty Entered By: Sandre Kitty on 12/27/2018 12:07:38

## 2019-01-29 NOTE — Progress Notes (Signed)
RAYLEY, GAO ([id number]) Visit Report for 01/24/2019 Arrival Information Details Patient Name: Date of Service: [name] [name], [name] [name] 01/24/2019 10:45 AM Medical Record Number:[id number] Patient Account Number: [id number] Date of Birth/Sex: Treating RN: November 13, 1955 (63 y.o. [name] Primary Care Leilani Cespedes: SYSTEM, PCP Other Clinician: Referring Mahdi Frye: Treating Annie Roseboom/Extender:Robson, Dulcy Fanny, Fuller Plan in Treatment: 82 Visit Information History Since Last Visit Added or deleted any medications: No Patient Arrived: Wheel Chair Any new allergies or adverse reactions: No Arrival Time: 11:08 Had a fall or experienced change in No activities of daily living that may affect Accompanied By: alone risk of falls: Transfer Assistance: None Signs or symptoms of abuse/neglect since last No Patient Identification Verified: Yes visito Secondary Verification Process Completed: Yes Hospitalized since last visit: No Patient Requires Transmission-Based No Implantable device outside of the clinic excluding No Precautions: cellular tissue based products placed in the center Patient Has Alerts: No since last visit: Has Dressing in Place as Prescribed: Yes Pain Present Now: Yes Electronic Signature(s) Signed: 01/28/2019 5:51:44 PM By: Levan Hurst RN, BSN Entered By: Levan Hurst on 01/24/2019 11:09:00 -------------------------------------------------------------------------------- Clinic Level of Care Assessment Details Patient Name: Date of Service: [name] [name], [name] [name] 01/24/2019 10:45 AM Medical Record [id number] Patient Account Number: [id number] Date of Birth/Sex: Treating RN: 06-10-1955 (63 y.o. Helene Shoe, Tammi Klippel Primary Care Srihari Shellhammer: SYSTEM, PCP Other Clinician: Referring Ayona Yniguez: Treating Brittanny Levenhagen/Extender:Robson, Dulcy Fanny, Fuller Plan in Treatment: 82 Clinic Level of Care Assessment Items TOOL 4 Quantity Score X - Use when only an  EandM is performed on FOLLOW-UP visit 1 0 ASSESSMENTS - Nursing Assessment / Reassessment X - Reassessment of Co-morbidities (includes updates in patient status) 1 10 X - Reassessment of Adherence to Treatment Plan 1 5 ASSESSMENTS - Wound and Skin Assessment / Reassessment []  - Simple Wound Assessment / Reassessment - one wound 0 X - Complex Wound Assessment / Reassessment - multiple wounds 2 5 X - Dermatologic / Skin Assessment (not related to wound area) 1 10 ASSESSMENTS - Focused Assessment []  - Circumferential Edema Measurements - multi extremities 0 X - Nutritional Assessment / Counseling / Intervention 1 10 []  - Lower Extremity Assessment (monofilament, tuning fork, pulses) 0 []  - Peripheral Arterial Disease Assessment (using hand held doppler) 0 ASSESSMENTS - Ostomy and/or Continence Assessment and Care []  - Incontinence Assessment and Management 0 []  - Ostomy Care Assessment and Management (repouching, etc.) 0 PROCESS - Coordination of Care []  - Simple Patient / Family Education for ongoing care 0 X - Complex (extensive) Patient / Family Education for ongoing care 1 20 X - Staff obtains Programmer, systems, Records, Test Results / Process Orders 1 10 X - Staff telephones HHA, Nursing Homes / Clarify orders / etc 1 10 []  - Routine Transfer to another Facility (non-emergent condition) 0 []  - Routine Hospital Admission (non-emergent condition) 0 []  - New Admissions / Biomedical engineer / Ordering NPWT, Apligraf, etc. 0 []  - Emergency Hospital Admission (emergent condition) 0 []  - Simple Discharge Coordination 0 X - Complex (extensive) Discharge Coordination 1 15 PROCESS - Special Needs []  - Pediatric / Minor Patient Management 0 []  - Isolation Patient Management 0 []  - Hearing / Language / Visual special needs 0 []  - Assessment of Community assistance (transportation, D/C planning, etc.) 0 []  - Additional assistance / Altered mentation 0 []  - Support Surface(s) Assessment (bed,  cushion, seat, etc.) 0 INTERVENTIONS - Wound Cleansing / Measurement []  - Simple Wound Cleansing - one wound 0 X - Complex Wound Cleansing - multiple wounds 2 5  X - Wound Imaging (photographs - any number of wounds) 1 5 []  - Wound Tracing (instead of photographs) 0 []  - Simple Wound Measurement - one wound 0 X - Complex Wound Measurement - multiple wounds 2 5 INTERVENTIONS - Wound Dressings []  - Small Wound Dressing one or multiple wounds 0 X - Medium Wound Dressing one or multiple wounds 2 15 []  - Large Wound Dressing one or multiple wounds 0 X - Application of Medications - topical 1 5 []  - Application of Medications - injection 0 INTERVENTIONS - Miscellaneous []  - External ear exam 0 []  - Specimen Collection (cultures, biopsies, blood, body fluids, etc.) 0 []  - Specimen(s) / Culture(s) sent or taken to Lab for analysis 0 []  - Patient Transfer (multiple staff / Civil Service fast streamer / Similar devices) 0 []  - Simple Staple / Suture removal (25 or less) 0 []  - Complex Staple / Suture removal (26 or more) 0 []  - Hypo / Hyperglycemic Management (close monitor of Blood Glucose) 0 []  - Ankle / Brachial Index (ABI) - do not check if billed separately 0 X - Vital Signs 1 5 Has the patient been seen at the hospital within the last three years: Yes Total Score: 165 Level Of Care: New/Established - Level 5 Electronic Signature(s) Signed: 01/24/2019 6:37:14 PM By: Deon Pilling Entered By: Deon Pilling on 01/24/2019 11:40:04 -------------------------------------------------------------------------------- Encounter Discharge Information Details Patient Name: Date of Service: [name] Dakin. 01/24/2019 10:45 AM Medical Record [id number] Patient Account Number: [id number] Date of Birth/Sex: Treating RN: 01-May-1955 (63 y.o. [name] Primary Care Monquie Fulgham: SYSTEM, PCP Other Clinician: Referring Shimon Trowbridge: Treating Ashok Sawaya/Extender:Robson, Dulcy Fanny, Fuller Plan in  Treatment: 44 Encounter Discharge Information Items Discharge Condition: Stable Ambulatory Status: Wheelchair Discharge Destination: Home Transportation: Private Auto Accompanied By: self Schedule Follow-up Appointment: Yes Clinical Summary of Care: Patient Declined Electronic Signature(s) Signed: 01/24/2019 5:56:19 PM By: Baruch Gouty RN, BSN Entered By: Baruch Gouty on 01/24/2019 12:04:09 -------------------------------------------------------------------------------- Multi Wound Chart Details Patient Name: Date of Service: [name] Dakin. 01/24/2019 10:45 AM Medical Record [id number] Patient Account Number: [id number] Date of Birth/Sex: Treating RN: [birth year] (63 y.o. F) Primary Care Camran Keady: SYSTEM, PCP Other Clinician: Referring Lauria Depoy: Treating Ahna Konkle/Extender:Robson, Dulcy Fanny, Fuller Plan in Treatment: 82 Vital Signs Height(in): 62 Pulse(bpm): 65 Weight(lbs): 260 Blood Pressure(mmHg): 98/49 Body Mass Index(BMI): 48 Temperature(F): 98.3 Respiratory 20 Rate(breaths/min): Photos: [3:No Photos] [4:No Photos] [N/A:N/A] Wound Location: [3:Right Abdomen - Lower Quadrant] [4:Right Flank] [N/A:N/A] Wounding Event: [3:Gradually Appeared] [4:Gradually Appeared] [N/A:N/A] Primary Etiology: [3:Dehisced Wound] [4:MASD Moisture Associated Skin Damage] [N/A:N/A] Comorbid History: [3:Cataracts, Lymphedema, Cataracts, Lymphedema, N/A Sleep Apnea, Hypertension, Sleep Apnea, Hypertension, Type II Diabetes, End Stage Type II Diabetes, End Stage Renal Disease, Gout, Rheumatoid Arthritis, Neuropathy] [4:Renal Disease,  Gout, Rheumatoid Arthritis, Neuropathy] Date Acquired: [3:12/07/2017] [4:05/31/2018] [N/A:N/A] Weeks of Treatment: [3:58] [4:32] [N/A:N/A] Wound Status: [3:Open] [4:Open] [N/A:N/A] Clustered Wound: [3:No] [4:Yes] [N/A:N/A] Clustered Quantity: [3:N/A] [4:3] [N/A:N/A] Measurements L x W x D 6.2x3x0.1 [4:1x8.8x0.1] [N/A:N/A] (cm) Area (cm) :  [3:14.608] [4:6.912] [N/A:N/A] Volume (cm) : [3:1.461] [4:0.691] [N/A:N/A] % Reduction in Area: [3:-125.40%] [4:15.00%] [N/A:N/A] % Reduction in Volume: 74.90% [4:15.00%] [N/A:N/A] Classification: [3:Full Thickness Without Exposed Support Structures] [4:N/A] [N/A:N/A] Exudate Amount: [3:Medium] [4:Medium] [N/A:N/A] Exudate Type: [3:Serosanguineous] [4:Serosanguineous] [N/A:N/A] Exudate Color: [3:red, brown] [4:red, brown] [N/A:N/A] Wound Margin: [3:Distinct, outline attached Distinct, outline attached N/A] Granulation Amount: [3:Large (67-100%)] [4:Large (67-100%)] [N/A:N/A] Granulation Quality: [3:Red, Pink] [4:Pink] [N/A:N/A] Necrotic Amount: [3:None Present (0%)] [4:None Present (0%)] [N/A:N/A] Exposed Structures: [3:Fat Layer (Subcutaneous Fat Layer (Subcutaneous N/A  Tissue) Exposed: Yes Fascia: No Tendon: No Muscle: No Joint: No Bone: No Small (1-33%)] [4:Tissue) Exposed: Yes Fascia: No Tendon: No Muscle: No Joint: No Bone: No Small (1-33%)] [N/A:N/A] Treatment Notes Wound #3 (Right Abdomen - Lower Quadrant) 3. Primary Dressing Applied Collegen AG 4. Secondary Dressing ABD Pad Dry Gauze 5. Secured With Recruitment consultant) Signed: 01/25/2019 7:57:15 AM By: Linton Ham MD Entered By: Linton Ham on 01/24/2019 12:03:35 -------------------------------------------------------------------------------- [place] Details Patient Name: Date of Service: [name] [name], [name] [name] 01/24/2019 10:45 AM Medical Record Number:[id number] Patient Account Number: [id number] Date of Birth/Sex: Treating RN: 1955-12-05 (63 y.o. [name] Primary Care Deziah Renwick: Other Clinician: SYSTEM, PCP Referring Tyechia Allmendinger: Treating Tyquisha Sharps/Extender:Robson, Dulcy Fanny, Fuller Plan in Treatment: 82 Active Inactive Wound/Skin Impairment Nursing Diagnoses: Impaired tissue integrity Goals: Patient/caregiver will verbalize understanding of skin care regimen Date  Initiated: 09/07/2017 Target Resolution Date: 02/22/2019 Goal Status: Active Ulcer/skin breakdown will have a volume reduction of 30% by week 4 Date Initiated: 06/29/2017 Date Inactivated: 08/04/2017 Target Resolution Date: 09/01/2017 Goal Status: Unmet Unmet Reason: larger Interventions: Assess patient/caregiver ability to obtain necessary supplies Assess patient/caregiver ability to perform ulcer/skin care regimen upon admission and as needed Assess ulceration(s) every visit Provide education on ulcer and skin care Notes: Electronic Signature(s) Signed: 01/24/2019 6:37:14 PM By: Deon Pilling Entered By: Deon Pilling on 01/24/2019 11:24:34 -------------------------------------------------------------------------------- Pain Assessment Details Patient Name: Date of Service: [name] [name], [name] [name] 01/24/2019 10:45 AM Medical Record [id number] Patient Account Number: [id number] Date of Birth/Sex: Treating RN: 1955-12-30 (63 y.o. [name] Primary Care Vicki Pasqual: SYSTEM, PCP Other Clinician: Referring Khyri Hinzman: Treating Marsa Matteo/Extender:Robson, Dulcy Fanny, Fuller Plan in Treatment: 82 Active Problems Location of Pain Severity and Description of Pain Patient Has Paino Yes Site Locations Pain Location: Pain in Ulcers With Dressing Change: Yes Duration of the Pain. Constant / Intermittento Intermittent Rate the pain. Current Pain Level: 4 Character of Pain Describe the Pain: Throbbing Pain Management and Medication Current Pain Management: Medication: Yes Cold Application: No Rest: No Massage: No Activity: No T.E.N.S.: No Heat Application: No Leg drop or elevation: No Is the Current Pain Management Adequate: Adequate How does your wound impact your activities of daily livingo Sleep: No Bathing: No Appetite: No Relationship With Others: No Bladder Continence: No Emotions: No Bowel Continence: No Work: No Toileting: No Drive: No Dressing:  No Hobbies: No Engineer, maintenance) Signed: 01/28/2019 5:51:44 PM By: Levan Hurst RN, BSN Entered By: Levan Hurst on 01/24/2019 11:15:18 -------------------------------------------------------------------------------- Patient/Caregiver Education Details [name] [name] 12/3/2020andnbsp10:45 Patient Name: Date of Service: M. AM Medical Record Patient Account Number: [id number] Number: Treating RN: Deon Pilling Date of Birth/Gender: 08/19/1955 (63 y.o. F) Other Clinician: Primary Care Physician: SYSTEM, PCP Treating Linton Ham Referring Physician: Physician/Extender: Dionisio Paschal in Treatment: 104 Education Assessment Education Provided To: Patient Education Topics Provided Wound/Skin Impairment: Handouts: Skin Care Do's and Dont's Methods: Explain/Verbal Responses: Reinforcements needed Electronic Signature(s) Signed: 01/24/2019 6:37:14 PM By: Deon Pilling Entered By: Deon Pilling on 01/24/2019 11:24:44 -------------------------------------------------------------------------------- Wound Assessment Details Patient Name: Date of Service: [name] [name], [name] [name] 01/24/2019 10:45 AM Medical Record [id number] Patient Account Number: [id number] Date of Birth/Sex: Treating RN: Feb 17, 1956 (63 y.o. [name] Primary Care Rick Warnick: SYSTEM, PCP Other Clinician: Referring Lanaya Bennis: Treating Ladavion Savitz/Extender:Robson, Dulcy Fanny, Fuller Plan in Treatment: 82 Wound Status Wound Number: 3 Primary Dehisced Wound Etiology: Wound Location: Right Abdomen - Lower Quadrant Wound Open Wounding Event: Gradually Appeared Status: Date Acquired: 12/07/2017 Comorbid Cataracts, Lymphedema, Sleep Apnea, Weeks Of Treatment: 58 History: Hypertension,  Type II Diabetes, End Stage Clustered Wound: No Renal Disease, Gout, Rheumatoid Arthritis, Neuropathy Photos Wound Measurements Length: (cm) 6.2 % Reduct Width: (cm) 3 % Reduct Depth:  (cm) 0.1 Epitheli Area: (cm) 14.608 Tunneli Volume: (cm) 1.461 Undermi Wound Description Classification: Full Thickness Without Exposed Support Foul Odo Structures Slough/F Wound Wound Distinct, outline attached Margin: Exudate Medium Amount: Exudate Serosanguineous Type: Exudate red, brown Color: Wound Bed Granulation Amount: Large (67-100%) Granulation Quality: Red, Pink Fascia Ex Necrotic Amount: None Present (0%) Fat Layer Tendon Ex Muscle Ex Joint Exp Bone Expo r After Cleansing: No ibrino No Exposed Structure posed: No (Subcutaneous Tissue) Exposed: Yes posed: No posed: No osed: No sed: No ion in Area: -125.4% ion in Volume: 74.9% alization: Small (1-33%) ng: No ning: No Treatment Notes Wound #3 (Right Abdomen - Lower Quadrant) 3. Primary Dressing Applied Collegen AG 4. Secondary Dressing ABD Pad Dry Gauze 5. Secured With Recruitment consultant) Signed: 01/28/2019 5:51:44 PM By: Levan Hurst RN, BSN Signed: 01/29/2019 4:34:42 PM By: Mikeal Hawthorne EMT/HBOT Entered By: Mikeal Hawthorne on 01/28/2019 13:39:05 -------------------------------------------------------------------------------- Wound Assessment Details Patient Name: Date of Service: [name] [name], [name] [name] 01/24/2019 10:45 AM Medical Record [id number] Patient Account Number: [id number] Date of Birth/Sex: Treating RN: October 22, 1955 (63 y.o. [name] Primary Care Chudney Scheffler: SYSTEM, PCP Other Clinician: Referring Dillinger Aston: Treating Pastor Sgro/Extender:Robson, Dulcy Fanny, Fuller Plan in Treatment: 82 Wound Status Wound Number: 4 Primary MASD Moisture Associated Skin Damage Etiology: Wound Location: Right Flank Wound Open Wounding Event: Gradually Appeared Status: Date Acquired: 05/31/2018 Comorbid Cataracts, Lymphedema, Sleep Apnea, Weeks Of Treatment: 32 History: Hypertension, Type II Diabetes, End Stage Clustered Wound: Yes Renal Disease, Gout, Rheumatoid  Arthritis, Neuropathy Photos Wound Measurements Length: (cm) 1 % Reductio Width: (cm) 8.8 % Reductio Depth: (cm) 0.1 Epithelial Clustered Quantity: 3 Tunneling: Area: (cm) 6.912 Undermini Volume: (cm) 0.691 Wound Description Wound Margin: Distinct, outline attached Exudate Amount: Medium Exudate Type: Serosanguineous Exudate Color: red, brown Wound Bed Granulation Amount: Large (67-100%) Granulation Quality: Pink Necrotic Amount: None Present (0%) Foul Odor After Cleansing: No Slough/Fibrino No Exposed Structure Fascia Exposed: No Fat Layer (Subcutaneous Tissue) Exposed: Yes Tendon Exposed: No Muscle Exposed: No Joint Exposed: No Bone Exposed: No n in Area: 15% n in Volume: 15% ization: Small (1-33%) No ng: No Treatment Notes Wound #4 (Right Flank) 2. Periwound Care Antifungal cream 3. Primary Dressing Applied Calcium Alginate Ag 4. Secondary Dressing Dry Gauze Roll Gauze Drawtex 5. Secured With Recruitment consultant) Signed: 01/28/2019 5:51:44 PM By: Levan Hurst RN, BSN Signed: 01/29/2019 4:34:42 PM By: Mikeal Hawthorne EMT/HBOT Entered By: Mikeal Hawthorne on 01/28/2019 13:39:32 -------------------------------------------------------------------------------- Vitals Details Patient Name: Date of Service: [name] [name], [name] [name] 01/24/2019 10:45 AM Medical Record Number:[id number] Patient Account Number: [id number] Date of Birth/Sex: Treating RN: [birth year] (63 y.o. [name] Primary Care Vannah Nadal: SYSTEM, PCP Other Clinician: Referring Keshan Reha: Treating Tashonda Pinkus/Extender:Robson, Dulcy Fanny, Fuller Plan in Treatment: 82 Vital Signs Time Taken: 11:10 Temperature (F): 98.3 Height (in): 62 Pulse (bpm): 76 Weight (lbs): 260 Respiratory Rate (breaths/min): 20 Body Mass Index (BMI): 47.5 Blood Pressure (mmHg): 98/49 Reference Range: 80 - 120 mg / dl Electronic Signature(s) Signed: 01/28/2019 5:51:44 PM By: Levan Hurst RN,  BSN Entered By: Levan Hurst on 01/24/2019 11:15:03

## 2019-01-29 NOTE — Progress Notes (Signed)
[name] [name], [name] [name] ([id number]) Visit Report for 12/06/2018 Arrival Information Details Patient Name: Date of Service: [name], [name] 12/06/2018 10:45 AM Medical Record Number:[id number] Patient Account Number: [id number] Date of Birth/Sex: Treating RN: October 26, 1955 (63 y.o. [name] Primary Care Mackinze Criado: SYSTEM, PCP Other Clinician: Referring Riyanna Crutchley: Treating Georganne Siple/Extender:Robson, Dulcy Fanny, Fuller Plan in Treatment: 39 Visit Information History Since Last Visit Added or deleted any medications: No Patient Arrived: Wheel Chair Any new allergies or adverse reactions: No Arrival Time: 10:34 granddaughter Had a fall or experienced change in No Accompanied By: activities of daily living that may affect Transfer Assistance: None risk of falls: Patient Identification Verified: Yes Signs or symptoms of abuse/neglect since last No Secondary Verification Process Yes visito Completed: Hospitalized since last visit: No Patient Requires Transmission-Based No Implantable device outside of the clinic excluding No Precautions: cellular tissue based products placed in the center Patient Has Alerts: No since last visit: Has Dressing in Place as Prescribed: Yes Pain Present Now: Yes Electronic Signature(s) Signed: 01/29/2019 3:02:15 PM By: Sandre Kitty Entered By: Sandre Kitty on 12/06/2018 10:34:52 -------------------------------------------------------------------------------- Clinic Level of Care Assessment Details Patient Name: Date of Service: [name], [name] 12/06/2018 10:45 AM Medical Record Number:[id number] Patient Account Number: [id number] Date of Birth/Sex: Treating RN: 1955-03-04 (63 y.o. [name] Primary Care Marciel Offenberger: SYSTEM, PCP Other Clinician: Referring Hancel Ion: Treating Jarry Manon/Extender:Robson, Dulcy Fanny, Fuller Plan in Treatment: 75 Clinic Level of Care Assessment Items TOOL 4 Quantity Score X - Use  when only an EandM is performed on FOLLOW-UP visit 1 0 ASSESSMENTS - Nursing Assessment / Reassessment X - Reassessment of Co-morbidities (includes updates in patient status) 1 10 X - Reassessment of Adherence to Treatment Plan 1 5 ASSESSMENTS - Wound and Skin Assessment / Reassessment []  - Simple Wound Assessment / Reassessment - one wound 0 X - Complex Wound Assessment / Reassessment - multiple wounds 2 5 []  - Dermatologic / Skin Assessment (not related to wound area) 0 ASSESSMENTS - Focused Assessment []  - Circumferential Edema Measurements - multi extremities 0 []  - Nutritional Assessment / Counseling / Intervention 0 []  - Lower Extremity Assessment (monofilament, tuning fork, pulses) 0 []  - Peripheral Arterial Disease Assessment (using hand held doppler) 0 ASSESSMENTS - Ostomy and/or Continence Assessment and Care []  - Incontinence Assessment and Management 0 []  - Ostomy Care Assessment and Management (repouching, etc.) 0 PROCESS - Coordination of Care X - Simple Patient / Family Education for ongoing care 1 15 []  - Complex (extensive) Patient / Family Education for ongoing care 0 X - Staff obtains Programmer, systems, Records, Test Results / Process Orders 1 10 X - Staff telephones HHA, Nursing Homes / Clarify orders / etc 1 10 []  - Routine Transfer to another Facility (non-emergent condition) 0 []  - Routine Hospital Admission (non-emergent condition) 0 []  - New Admissions / Biomedical engineer / Ordering NPWT, Apligraf, etc. 0 []  - Emergency Hospital Admission (emergent condition) 0 X - Simple Discharge Coordination 1 10 []  - Complex (extensive) Discharge Coordination 0 PROCESS - Special Needs []  - Pediatric / Minor Patient Management 0 []  - Isolation Patient Management 0 []  - Hearing / Language / Visual special needs 0 []  - Assessment of Community assistance (transportation, D/C planning, etc.) 0 []  - Additional assistance / Altered mentation 0 []  - Support Surface(s) Assessment  (bed, cushion, seat, etc.) 0 INTERVENTIONS - Wound Cleansing / Measurement []  - Simple Wound Cleansing - one wound 0 X - Complex Wound Cleansing - multiple wounds 2 5 X - Wound Imaging (  photographs - any number of wounds) 1 5 []  - Wound Tracing (instead of photographs) 0 []  - Simple Wound Measurement - one wound 0 X - Complex Wound Measurement - multiple wounds 2 5 INTERVENTIONS - Wound Dressings X - Small Wound Dressing one or multiple wounds 2 10 []  - Medium Wound Dressing one or multiple wounds 0 []  - Large Wound Dressing one or multiple wounds 0 X - Application of Medications - topical 1 5 []  - Application of Medications - injection 0 INTERVENTIONS - Miscellaneous []  - External ear exam 0 []  - Specimen Collection (cultures, biopsies, blood, body fluids, etc.) 0 []  - Specimen(s) / Culture(s) sent or taken to Lab for analysis 0 []  - Patient Transfer (multiple staff / Civil Service fast streamer / Similar devices) 0 []  - Simple Staple / Suture removal (25 or less) 0 []  - Complex Staple / Suture removal (26 or more) 0 []  - Hypo / Hyperglycemic Management (close monitor of Blood Glucose) 0 []  - Ankle / Brachial Index (ABI) - do not check if billed separately 0 X - Vital Signs 1 5 Has the patient been seen at the hospital within the last three years: Yes Total Score: 125 Level Of Care: New/Established - Level 4 Electronic Signature(s) Signed: 12/07/2018 6:00:55 PM By: Levan Hurst RN, BSN Entered By: Levan Hurst on 12/06/2018 12:09:14 -------------------------------------------------------------------------------- Encounter Discharge Information Details Patient Name: Date of Service: [name] [name]. 12/06/2018 10:45 AM Medical Record [id number] Patient Account Number: [id number] Date of Birth/Sex: Treating RN: 1955/06/09 (63 y.o. [name] Primary Care Ulysess Witz: SYSTEM, PCP Other Clinician: Referring Shiva Sahagian: Treating Keilon Ressel/Extender:Robson, Dulcy Fanny,  Fuller Plan in Treatment: 51 Encounter Discharge Information Items Discharge Condition: Stable Ambulatory Status: Wheelchair Discharge Destination: Home Transportation: Private Auto Accompanied By: family member Schedule Follow-up Appointment: Yes Clinical Summary of Care: Patient Declined Electronic Signature(s) Signed: 12/06/2018 6:42:53 PM By: Kela Millin Entered By: Kela Millin on 12/06/2018 11:33:41 -------------------------------------------------------------------------------- Multi Wound Chart Details Patient Name: Date of Service: [name] [name]. 12/06/2018 10:45 AM Medical Record [id number] Patient Account Number: [id number] Date of Birth/Sex: Treating RN: Jul 18, 1955 (63 y.o. [name] Primary Care Yolandra Habig: SYSTEM, PCP Other Clinician: Referring Sarayah Bacchi: Treating Nashika Coker/Extender:Robson, Dulcy Fanny, Fuller Plan in Treatment: 75 Vital Signs Height(in): 62 Pulse(bpm): 73 Weight(lbs): 260 Blood Pressure(mmHg): 132/48 Body Mass Index(BMI): 48 Temperature(F): 98.3 Respiratory 18 Rate(breaths/min): Photos: [3:No Photos] [4:No Photos] [N/A:N/A] Wound Location: [3:Right Abdomen - Lower Quadrant] [4:Right Flank] [N/A:N/A] Wounding Event: [3:Gradually Appeared] [4:Gradually Appeared] [N/A:N/A] Primary Etiology: [3:Dehisced Wound] [4:MASD Moisture Associated Skin Damage] [N/A:N/A] Comorbid History: [3:Cataracts, Lymphedema, Cataracts, Lymphedema, N/A Sleep Apnea, Hypertension, Sleep Apnea, Hypertension, Type II Diabetes, End Stage Type II Diabetes, End Stage Renal Disease, Gout, Rheumatoid Arthritis, Neuropathy] [4:Renal Disease,  Gout, Rheumatoid Arthritis, Neuropathy] Date Acquired: [3:12/07/2017] [4:05/31/2018] [N/A:N/A] Weeks of Treatment: [3:51] [4:25] [N/A:N/A] Wound Status: [3:Open] [4:Open] [N/A:N/A] Clustered Wound: [3:No] [4:Yes] [N/A:N/A] Clustered Quantity: [3:N/A] [4:3] [N/A:N/A] Measurements L x W x D 6.7x3.7x0.1  [4:1.5x9.5x0.2] [N/A:N/A] (cm) Area (cm) : [3:19.47] [4:11.192] [N/A:N/A] Volume (cm) : [3:1.947] [4:2.238] [N/A:N/A] % Reduction in Area: [3:-200.50%] [4:-37.70%] [N/A:N/A] % Reduction in Volume: 66.60% [4:-175.30%] [N/A:N/A] Classification: [3:Full Thickness Without Exposed Support Structures] [4:N/A] [N/A:N/A] Exudate Amount: [3:Medium] [4:Medium] [N/A:N/A] Exudate Type: [3:Serosanguineous] [4:Serosanguineous] [N/A:N/A] Exudate Color: [3:red, brown] [4:red, brown] [N/A:N/A] Wound Margin: [3:Distinct, outline attached Distinct, outline attached N/A] Granulation Amount: [3:Large (67-100%)] [4:Large (67-100%)] [N/A:N/A] Granulation Quality: [3:Red, Pink] [4:Red, Pink] [N/A:N/A] Necrotic Amount: [3:Small (1-33%)] [4:None Present (0%)] [N/A:N/A] Exposed Structures: [3:Fat Layer (Subcutaneous Fat Layer (Subcutaneous N/A Tissue)  Exposed: Yes Fascia: No Tendon: No Muscle: No Joint: No Bone: No Small (1-33%)] [4:Tissue) Exposed: Yes Fascia: No Tendon: No Muscle: No Joint: No Bone: No Small (1-33%)] [N/A:N/A] Treatment Notes Electronic Signature(s) Signed: 12/06/2018 5:35:18 PM By: Linton Ham MD Signed: 12/07/2018 6:00:55 PM By: Levan Hurst RN, BSN Entered By: Linton Ham on 12/06/2018 11:09:52 -------------------------------------------------------------------------------- Multi-Disciplinary Care Plan Details Patient Name: Date of Service: [name] [name]. 12/06/2018 10:45 AM Medical Record [id number] Patient Account Number: [id number] Date of Birth/Sex: Treating RN: Jul 07, 1955 (63 y.o. [name] Primary Care Evee Liska: SYSTEM, PCP Other Clinician: Referring Chapin Arduini: Treating Borghild Thaker/Extender:Robson, Dulcy Fanny, Fuller Plan in Treatment: 75 Active Inactive Wound/Skin Impairment Nursing Diagnoses: Impaired tissue integrity Goals: Patient/caregiver will verbalize understanding of skin care regimen Date Initiated: 09/07/2017 Target Resolution  Date: 12/21/2018 Goal Status: Active Ulcer/skin breakdown will have a volume reduction of 30% by week 4 Date Initiated: 06/29/2017 Date Inactivated: 08/04/2017 Target Resolution Date: 09/01/2017 Goal Status: Unmet Unmet Reason: larger Interventions: Assess patient/caregiver ability to obtain necessary supplies Assess patient/caregiver ability to perform ulcer/skin care regimen upon admission and as needed Assess ulceration(s) every visit Provide education on ulcer and skin care Notes: Electronic Signature(s) Signed: 12/07/2018 6:00:55 PM By: Levan Hurst RN, BSN Entered By: Levan Hurst on 12/06/2018 10:52:00 -------------------------------------------------------------------------------- Pain Assessment Details Patient Name: Date of Service: [name] [name] 12/06/2018 10:45 AM Medical Record [id number] Patient Account Number: [id number] Date of Birth/Sex: Treating RN: 1955/12/19 (63 y.o. [name] Primary Care Jeremie Abdelaziz: SYSTEM, PCP Other Clinician: Referring Emon Lance: Treating Thaison Kolodziejski/Extender:Robson, Dulcy Fanny, Fuller Plan in Treatment: 77 Active Problems Location of Pain Severity and Description of Pain Patient Has Paino Yes Site Locations Rate the pain. Current Pain Level: 6 Pain Management and Medication Current Pain Management: Electronic Signature(s) Signed: 12/07/2018 6:00:55 PM By: Levan Hurst RN, BSN Signed: 01/29/2019 3:02:15 PM By: Sandre Kitty Entered By: Sandre Kitty on 12/06/2018 10:35:20 -------------------------------------------------------------------------------- Patient/Caregiver Education Details Christain [place] 10/15/2020andnbsp10:45 Patient Name: Date of Service: M. AM Medical Record Patient Account Number: [id number] Number: Treating RN: Levan Hurst Date of Birth/Gender: Sep 10, 1955 (63 y.o. F) Other Clinician: Primary Care Physician:SYSTEM, PCP Treating Linton Ham Referring  Physician: Physician/Extender: Dionisio Paschal in Treatment: 70 Education Assessment Education Provided To: Patient Education Topics Provided Wound/Skin Impairment: Methods: Explain/Verbal Responses: State content correctly Electronic Signature(s) Signed: 12/07/2018 6:00:55 PM By: Levan Hurst RN, BSN Entered By: Levan Hurst on 12/06/2018 10:52:21 -------------------------------------------------------------------------------- Wound Assessment Details Patient Name: Date of Service: LAKIA, GRITTON 12/06/2018 10:45 AM Medical Record [id number] Patient Account Number: [id number] Date of Birth/Sex: Treating RN: October 22, 1955 (63 y.o. [name] Primary Care Lossie Kalp: SYSTEM, PCP Other Clinician: Referring Jona Erkkila: Treating Lanson Randle/Extender:Robson, Dulcy Fanny, Fuller Plan in Treatment: 75 Wound Status Wound Number: 3 Primary Dehisced Wound Etiology: Wound Location: Right Abdomen - Lower Quadrant Wound Open Wounding Event: Gradually Appeared Status: Date Acquired: 12/07/2017 Comorbid Cataracts, Lymphedema, Sleep Apnea, Weeks Of Treatment: 51 History: Hypertension, Type II Diabetes, End Stage Clustered Wound: No Renal Disease, Gout, Rheumatoid Arthritis, Neuropathy Photos Wound Measurements Length: (cm) 6.7 % Reduct Width: (cm) 3.7 % Reduct Depth: (cm) 0.1 Epitheli Area: (cm) 19.47 Tunneli Volume: (cm) 1.947 Undermi Wound Description Full Thickness Without Exposed Support Foul Odo Classification: Structures Slough/F Wound Distinct, outline attached Margin: Exudate Medium Amount: Exudate Serosanguineous Type: Exudate red, brown Color: Wound Bed Granulation Amount: Large (67-100%) Granulation Quality: Red, Pink Fascia E Necrotic Amount: Small (1-33%) Fat Laye Necrotic Quality: Adherent Slough Tendon E Muscle E Joint Ex Bone Exp r After Cleansing: No ibrino  Yes Exposed Structure xposed: No r (Subcutaneous Tissue)  Exposed: Yes xposed: No xposed: No posed: No osed: No ion in Area: -200.5% ion in Volume: 66.6% alization: Small (1-33%) ng: No ning: No Electronic Signature(s) Signed: 12/07/2018 6:00:55 PM By: Levan Hurst RN, BSN Signed: 12/11/2018 1:32:19 PM By: Mikeal Hawthorne EMT/HBOT Entered By: Mikeal Hawthorne on 12/06/2018 14:26:37 -------------------------------------------------------------------------------- Wound Assessment Details Patient Name: Date of Service: PAULINE, PEGUES 12/06/2018 10:45 AM Medical Record [id number] Patient Account Number: [id number] Date of Birth/Sex: Treating RN: [birth year] (63 y.o. [name] Primary Care Koleen Celia: SYSTEM, PCP Other Clinician: Referring Dary Dilauro: Treating Bradie Lacock/Extender:Robson, Dulcy Fanny, Fuller Plan in Treatment: 75 Wound Status Wound Number: 4 Primary MASD Moisture Associated Skin Damage Etiology: Wound Location: Right Flank Wound Open Wounding Event: Gradually Appeared Status: Date Acquired: 05/31/2018 Comorbid Cataracts, Lymphedema, Sleep Apnea, Weeks Of Treatment: 25 History: Hypertension, Type II Diabetes, End Stage Clustered Wound: Yes Renal Disease, Gout, Rheumatoid Arthritis, Neuropathy Photos Wound Measurements Length: (cm) 1.5 % Reductio Width: (cm) 9.5 % Reductio Depth: (cm) 0.2 Epithelial Clustered Quantity: 3 Tunneling: Area: (cm) 11.192 Undermini Volume: (cm) 2.238 Wound Description Wound Margin: Distinct, outline attached Exudate Amount: Medium Exudate Type: Serosanguineous Exudate Color: red, brown Wound Bed Granulation Amount: Large (67-100%) Granulation Quality: Red, Pink Necrotic Amount: None Present (0%) Foul Odor After Cleansing: No Slough/Fibrino No Exposed Structure Fascia Exposed: No Fat Layer (Subcutaneous Tissue) Exposed: Yes Tendon Exposed: No Muscle Exposed: No Joint Exposed: No Bone Exposed: No n in Area: -37.7% n in Volume: -175.3% ization: Small  (1-33%) No ng: No Electronic Signature(s) Signed: 12/07/2018 6:00:55 PM By: Levan Hurst RN, BSN Signed: 12/11/2018 1:32:19 PM By: Mikeal Hawthorne EMT/HBOT Entered By: Mikeal Hawthorne on 12/06/2018 14:26:59 -------------------------------------------------------------------------------- Vitals Details Patient Name: Date of Service: [name] [name]. 12/06/2018 10:45 AM Medical Record [id number] Patient Account Number: [id number] Date of Birth/Sex: Treating RN: 11/18/1955 (63 y.o. [name] Primary Care Lacy Sofia: SYSTEM, PCP Other Clinician: Referring Eudelia Hiltunen: Treating Carry Weesner/Extender:Robson, Dulcy Fanny, Fuller Plan in Treatment: 75 Vital Signs Time Taken: 10:34 Temperature (F): 98.3 Height (in): 62 Pulse (bpm): 66 Weight (lbs): 260 Respiratory Rate (breaths/min): 18 Body Mass Index (BMI): 47.5 Blood Pressure (mmHg): 132/48 Reference Range: 80 - 120 mg / dl Electronic Signature(s) Signed: 01/29/2019 3:02:15 PM By: Sandre Kitty Entered By: Sandre Kitty on 12/06/2018 10:35:10

## 2019-02-14 ENCOUNTER — Encounter (HOSPITAL_BASED_OUTPATIENT_CLINIC_OR_DEPARTMENT_OTHER): Payer: Medicare Other | Admitting: Internal Medicine

## 2019-02-18 ENCOUNTER — Other Ambulatory Visit: Payer: Self-pay

## 2019-02-18 ENCOUNTER — Encounter (HOSPITAL_BASED_OUTPATIENT_CLINIC_OR_DEPARTMENT_OTHER): Payer: Medicare Other | Admitting: Internal Medicine

## 2019-02-18 DIAGNOSIS — S31103A Unspecified open wound of abdominal wall, right lower quadrant without penetration into peritoneal cavity, initial encounter: Secondary | ICD-10-CM | POA: Diagnosis not present

## 2019-02-18 NOTE — Progress Notes (Signed)
[name] [name], [name] [name] ([id number]) Visit Report for 02/18/2019 Arrival Information Details Patient Name: Date of Service: [name] [name], [name] [name] 02/18/2019 9:30 AM Medical Record Number:[id number] Patient Account Number: [id number] Date of Birth/Sex: Treating RN: [birth year] (63 y.o. [name] Primary Care Peg Fifer: SYSTEM, PCP Other Clinician: Referring Kamya Watling: Treating Taiki Buckwalter/Extender:Robson, Dulcy Fanny, Fuller Plan in Treatment: 83 Visit Information History Since Last Visit Added or deleted any medications: No Patient Arrived: Wheel Chair Any new allergies or adverse reactions: No Arrival Time: 10:48 Had a fall or experienced change in No activities of daily living that may affect Accompanied By: self risk of falls: Transfer Assistance: None Signs or symptoms of abuse/neglect since last No Patient Identification Verified: Yes visito Secondary Verification Process Completed: Yes Hospitalized since last visit: No Patient Requires Transmission-Based No Implantable device outside of the clinic excluding No Precautions: cellular tissue based products placed in the center Patient Has Alerts: No since last visit: Has Dressing in Place as Prescribed: Yes Pain Present Now: No Electronic Signature(s) Signed: 02/18/2019 5:46:04 PM By: Kela Millin Entered By: Kela Millin on 02/18/2019 10:48:59 -------------------------------------------------------------------------------- Clinic Level of Care Assessment Details Patient Name: Date of Service: [name] [name], [name] [name] 02/18/2019 9:30 AM Medical Record Number:[id number] Patient Account Number: [id number] Date of Birth/Sex: Treating RN: [birth year] (63 y.o. [name] Primary Care Ennifer Harston: SYSTEM, PCP Other Clinician: Referring Constance Hackenberg: Treating Breniya Goertzen/Extender:Robson, Dulcy Fanny, Fuller Plan in Treatment: 50 Clinic Level of Care Assessment Items TOOL 4 Quantity Score X - Use when only  an EandM is performed on FOLLOW-UP visit 1 0 ASSESSMENTS - Nursing Assessment / Reassessment X - Reassessment of Co-morbidities (includes updates in patient status) 1 10 X - Reassessment of Adherence to Treatment Plan 1 5 ASSESSMENTS - Wound and Skin Assessment / Reassessment []  - Simple Wound Assessment / Reassessment - one wound 0 X - Complex Wound Assessment / Reassessment - multiple wounds 2 5 []  - Dermatologic / Skin Assessment (not related to wound area) 0 ASSESSMENTS - Focused Assessment []  - Circumferential Edema Measurements - multi extremities 0 []  - Nutritional Assessment / Counseling / Intervention 0 []  - Lower Extremity Assessment (monofilament, tuning fork, pulses) 0 []  - Peripheral Arterial Disease Assessment (using hand held doppler) 0 ASSESSMENTS - Ostomy and/or Continence Assessment and Care []  - Incontinence Assessment and Management 0 []  - Ostomy Care Assessment and Management (repouching, etc.) 0 PROCESS - Coordination of Care X - Simple Patient / Family Education for ongoing care 1 15 []  - Complex (extensive) Patient / Family Education for ongoing care 0 X - Staff obtains Programmer, systems, Records, Test Results / Process Orders 1 10 X - Staff telephones HHA, Nursing Homes / Clarify orders / etc 1 10 []  - Routine Transfer to another Facility (non-emergent condition) 0 []  - Routine Hospital Admission (non-emergent condition) 0 []  - New Admissions / Biomedical engineer / Ordering NPWT, Apligraf, etc. 0 []  - Emergency Hospital Admission (emergent condition) 0 X - Simple Discharge Coordination 1 10 []  - Complex (extensive) Discharge Coordination 0 PROCESS - Special Needs []  - Pediatric / Minor Patient Management 0 []  - Isolation Patient Management 0 []  - Hearing / Language / Visual special needs 0 []  - Assessment of Community assistance (transportation, D/C planning, etc.) 0 []  - Additional assistance / Altered mentation 0 []  - Support Surface(s) Assessment (bed,  cushion, seat, etc.) 0 INTERVENTIONS - Wound Cleansing / Measurement []  - Simple Wound Cleansing - one wound 0 X - Complex Wound Cleansing - multiple wounds 2 5 X - Wound Imaging (  photographs - any number of wounds) 1 5 []  - Wound Tracing (instead of photographs) 0 []  - Simple Wound Measurement - one wound 0 X - Complex Wound Measurement - multiple wounds 2 5 INTERVENTIONS - Wound Dressings X - Small Wound Dressing one or multiple wounds 2 10 []  - Medium Wound Dressing one or multiple wounds 0 []  - Large Wound Dressing one or multiple wounds 0 X - Application of Medications - topical 1 5 []  - Application of Medications - injection 0 INTERVENTIONS - Miscellaneous []  - External ear exam 0 []  - Specimen Collection (cultures, biopsies, blood, body fluids, etc.) 0 []  - Specimen(s) / Culture(s) sent or taken to Lab for analysis 0 []  - Patient Transfer (multiple staff / Civil Service fast streamer / Similar devices) 0 []  - Simple Staple / Suture removal (25 or less) 0 []  - Complex Staple / Suture removal (26 or more) 0 []  - Hypo / Hyperglycemic Management (close monitor of Blood Glucose) 0 []  - Ankle / Brachial Index (ABI) - do not check if billed separately 0 X - Vital Signs 1 5 Has the patient been seen at the hospital within the last three years: Yes Total Score: 125 Level Of Care: New/Established - Level 4 Electronic Signature(s) Signed: 02/18/2019 6:10:06 PM By: Levan Hurst RN, BSN Entered By: Levan Hurst on 02/18/2019 17:50:58 -------------------------------------------------------------------------------- Encounter Discharge Information Details Patient Name: Date of Service: [name] [name]. 02/18/2019 9:30 AM Medical Record [id number] Patient Account Number: [id number] Date of Birth/Sex: Treating RN: 1955/03/31 (63 y.o. [name] Primary Care Courtnie Brenes: SYSTEM, PCP Other Clinician: Referring Lamondre Wesche: Treating Oluwadamilola Deliz/Extender:Robson, Dulcy Fanny, Fuller Plan in  Treatment: 2 Encounter Discharge Information Items Discharge Condition: Stable Ambulatory Status: Wheelchair Discharge Destination: Home Transportation: Private Auto Accompanied By: self Schedule Follow-up Appointment: Yes Clinical Summary of Care: Patient Declined Electronic Signature(s) Signed: 02/18/2019 5:44:52 PM By: Deon Pilling Entered By: Deon Pilling on 02/18/2019 11:25:57 -------------------------------------------------------------------------------- Lower Extremity Assessment Details Patient Name: Date of Service: [name] [name], [name] [name] 02/18/2019 9:30 AM Medical Record Number:[id number] Patient Account Number: [id number] Date of Birth/Sex: Treating RN: Aug 03, 1955 (63 y.o. [name] Primary Care Arshi Duarte: SYSTEM, PCP Other Clinician: Referring Makenze Ellett: Treating Lunden Mcleish/Extender:Robson, Dulcy Fanny, Fuller Plan in Treatment: 79 Electronic Signature(s) Signed: 02/18/2019 5:46:04 PM By: Kela Millin Entered By: Kela Millin on 02/18/2019 10:50:00 -------------------------------------------------------------------------------- Multi Wound Chart Details Patient Name: Date of Service: [name] [name]. 02/18/2019 9:30 AM Medical Record Number:[id number] Patient Account Number: [id number] Date of Birth/Sex: Treating RN: [birth year] (63 y.o. F) Primary Care Jamayah Myszka: SYSTEM, PCP Other Clinician: Referring Shilah Hefel: Treating Hargun Spurling/Extender:Robson, Dulcy Fanny, Fuller Plan in Treatment: 85 Vital Signs Height(in): 62 Pulse(bpm): 59 Weight(lbs): 260 Blood Pressure(mmHg): 122/49 Body Mass Index(BMI): 48 Temperature(F): 97.8 Respiratory 20 Rate(breaths/min): Photos: [3:No Photos] [4:No Photos] [N/A:N/A] Wound Location: [3:Right Abdomen - Lower Quadrant] [4:Right Flank] [N/A:N/A] Wounding Event: [3:Gradually Appeared] [4:Gradually Appeared] [N/A:N/A] Primary Etiology: [3:Dehisced Wound] [4:MASD Moisture Associated Skin Damage]  [N/A:N/A] Comorbid History: [3:Cataracts, Lymphedema, Cataracts, Lymphedema, N/A Sleep Apnea, Hypertension, Sleep Apnea, Hypertension, Type II Diabetes, End Stage Type II Diabetes, End Stage Renal Disease, Gout, Rheumatoid Arthritis, Neuropathy] [4:Renal Disease,  Gout, Rheumatoid Arthritis, Neuropathy] Date Acquired: [3:12/07/2017] [4:05/31/2018] [N/A:N/A] Weeks of Treatment: [3:61] [4:35] [N/A:N/A] Wound Status: [3:Open] [4:Open] [N/A:N/A] Clustered Wound: [3:No] [4:Yes] [N/A:N/A] Clustered Quantity: [3:N/A] [4:3] [N/A:N/A] Measurements L x W x D 6x2.4x0.1 [4:1x8.5x0.1] [N/A:N/A] (cm) Area (cm) : [3:11.31] [4:6.676] [N/A:N/A] Volume (cm) : [3:1.131] [4:0.668] [N/A:N/A] % Reduction in Area: [3:-74.50%] [4:17.90%] [N/A:N/A] % Reduction in Volume: 80.60% [4:17.80%] [N/A:N/A] Classification: [3:Full  Thickness Without Exposed Support Structures] [4:N/A] [N/A:N/A] Exudate Amount: [3:Medium] [4:Medium] [N/A:N/A] Exudate Type: [3:Serosanguineous] [4:Serosanguineous] [N/A:N/A] Exudate Color: [3:red, brown] [4:red, brown] [N/A:N/A] Wound Margin: [3:Distinct, outline attached Distinct, outline attached N/A] Granulation Amount: [3:Large (67-100%)] [4:Large (67-100%)] [N/A:N/A] Granulation Quality: [3:Red, Pink] [4:Pink] [N/A:N/A] Necrotic Amount: [3:None Present (0%)] [4:None Present (0%)] [N/A:N/A] Exposed Structures: [3:Fat Layer (Subcutaneous Fat Layer (Subcutaneous N/A Tissue) Exposed: Yes Fascia: No Tendon: No Muscle: No Joint: No Bone: No Small (1-33%)] [4:Tissue) Exposed: Yes Fascia: No Tendon: No Muscle: No Joint: No Bone: No Small (1-33%)] [N/A:N/A] Treatment Notes Wound #3 (Right Abdomen - Lower Quadrant) 1. Cleanse With Wound Cleanser 2. Periwound Care Skin Prep 3. Primary Dressing Applied Collagen Hydrogel or K-Y Jelly 4. Secondary Dressing ABD Pad Dry Gauze 5. Secured With Medipore tape Other (specify in notes) Notes paper tape used as well. Wound #4 (Right Flank) 1.  Cleanse With Wound Cleanser 2. Periwound Care Skin Prep 3. Primary Dressing Applied Calcium Alginate 4. Secondary Dressing ABD Pad Dry Gauze Drawtex 5. Secured With [facility] [facility] [employer]) Signed: 02/18/2019 6:10:19 PM By: Linton Ham MD Entered By: Linton Ham on 02/18/2019 12:46:26 -------------------------------------------------------------------------------- Multi-Disciplinary Care Plan Details Patient Name: Date of Service: [name] [name]. 02/18/2019 9:30 AM Medical Record Number:[id number] Patient Account Number: [id number] Date of Birth/Sex: Treating RN: 10-17-1955 (63 y.o. [name] Primary Care Ardyce Heyer: SYSTEM, PCP Other Clinician: Referring Sareena Odeh: Treating Vikki Gains/Extender:Robson, Dulcy Fanny, Fuller Plan in Treatment: 85 Active Inactive Wound/Skin Impairment Nursing Diagnoses: Impaired tissue integrity Goals: Patient/caregiver will verbalize understanding of skin care regimen Date Initiated: 09/07/2017 Target Resolution Date: 03/22/2019 Goal Status: Active Ulcer/skin breakdown will have a volume reduction of 30% by week 4 Date Initiated: 06/29/2017 Date Inactivated: 08/04/2017 Target Resolution Date: 09/01/2017 Goal Status: Unmet Unmet Reason: larger Interventions: Assess patient/caregiver ability to obtain necessary supplies Assess patient/caregiver ability to perform ulcer/skin care regimen upon admission and as needed Assess ulceration(s) every visit Provide education on ulcer and skin care Notes: Electronic Signature(s) Signed: 02/18/2019 6:10:06 PM By: Levan Hurst RN, BSN Entered By: Levan Hurst on 02/18/2019 17:49:16 -------------------------------------------------------------------------------- Pain Assessment Details Patient Name: Date of Service: [name] [name] 02/18/2019 9:30 AM Medical Record [id number] Patient Account Number: [id number] Date of Birth/Sex: Treating RN: 1955-12-24  (63 y.o. [name] Primary Care Truman Aceituno: SYSTEM, PCP Other Clinician: Referring Latravion Graves: Treating Damion Kant/Extender:Robson, Dulcy Fanny, Fuller Plan in Treatment: 53 Active Problems Location of Pain Severity and Description of Pain Patient Has Paino No Site Locations Pain Management and Medication Current Pain Management: Electronic Signature(s) Signed: 02/18/2019 5:46:04 PM By: Kela Millin Entered By: Kela Millin on 02/18/2019 10:49:28 -------------------------------------------------------------------------------- Patient/Caregiver Education Details Christain [place] 12/28/2020andnbsp9:30 Patient Name: Date of Service: M. AM Medical Record Patient Account Number: [id number] Number: Treating RN: Levan Hurst Date of Birth/Gender: 02/21/1956 (63 y.o. F) Other Clinician: Primary Care Physician: SYSTEM, PCP Treating Linton Ham Referring Physician: Physician/Extender: Dionisio Paschal in Treatment: 25 Education Assessment Education Provided To: Patient Education Topics Provided Wound/Skin Impairment: Methods: Explain/Verbal Responses: State content correctly Electronic Signature(s) Signed: 02/18/2019 6:10:06 PM By: Levan Hurst RN, BSN Entered By: Levan Hurst on 02/18/2019 17:49:26 -------------------------------------------------------------------------------- Wound Assessment Details Patient Name: Date of Service: [name] [name]. 02/18/2019 9:30 AM Medical Record [id number] Patient Account Number: [id number] Date of Birth/Sex: Treating RN: [birth year] (63 y.o. [name] Primary Care Shalissa Easterwood: SYSTEM, PCP Other Clinician: Referring Hiliary Osorto: Treating Exavior Kimmons/Extender:Robson, Dulcy Fanny, Fuller Plan in Treatment: 85 Wound Status Wound Number: 3 Primary Dehisced Wound Etiology: Wound Location: Right Abdomen - Lower Quadrant  Wound Open Wounding Event: Gradually  Appeared Status: Date Acquired: 12/07/2017 Comorbid Cataracts, Lymphedema, Sleep Apnea, Weeks Of Treatment: 61 History: Hypertension, Type II Diabetes, End Stage Clustered Wound: No Renal Disease, Gout, Rheumatoid Arthritis, Neuropathy Wound Measurements Length: (cm) 6 % Reduct Width: (cm) 2.4 % Reduct Depth: (cm) 0.1 Epitheli Area: (cm) 11.31 Tunneli Volume: (cm) 1.131 Undermi Wound Description Classification: Full Thickness Without Exposed Support Foul Odo Structures Slough/F Wound Distinct, outline attached Margin: Exudate Medium Amount: Exudate Serosanguineous Type: Exudate red, brown Color: Wound Bed Granulation Amount: Large (67-100%) Granulation Quality: Red, Pink Fascia E Necrotic Amount: None Present (0%) Fat Laye Tendon E Muscle E Joint Ex Bone Exp r After Cleansing: No ibrino No Exposed Structure xposed: No r (Subcutaneous Tissue) Exposed: Yes xposed: No xposed: No posed: No osed: No ion in Area: -74.5% ion in Volume: 80.6% alization: Small (1-33%) ng: No ning: No Treatment Notes Wound #3 (Right Abdomen - Lower Quadrant) 1. Cleanse With Wound Cleanser 2. Periwound Care Skin Prep 3. Primary Dressing Applied Collagen Hydrogel or K-Y Jelly 4. Secondary Dressing ABD Pad Dry Gauze 5. Secured With Medipore tape Other (specify in notes) Notes paper tape used as well. Electronic Signature(s) Signed: 02/18/2019 5:46:04 PM By: Kela Millin Entered By: Kela Millin on 02/18/2019 10:48:14 -------------------------------------------------------------------------------- Wound Assessment Details Patient Name: Date of Service: [name] [name], [name] [name] 02/18/2019 9:30 AM Medical Record Number:[id number] Patient Account Number: [id number] Date of Birth/Sex: Treating RN: October 03, 1955 (63 y.o. [name] Primary Care Estanislao Harmon: SYSTEM, PCP Other Clinician: Referring Galilea Quito: Treating Murray Guzzetta/Extender:Robson, Dulcy Fanny,  Fuller Plan in Treatment: 85 Wound Status Wound Number: 4 Primary MASD Moisture Associated Skin Damage Etiology: Wound Location: Right Flank Wound Open Wounding Event: Gradually Appeared Status: Date Acquired: 05/31/2018 Comorbid Cataracts, Lymphedema, Sleep Apnea, Weeks Of Treatment: 35 History: Hypertension, Type II Diabetes, End Stage Clustered Wound: Yes Renal Disease, Gout, Rheumatoid Arthritis, Neuropathy Wound Measurements Length: (cm) 1 % Reduction i Width: (cm) 8.5 % Reduction i Depth: (cm) 0.1 Epithelializa Clustered Quantity: 3 Tunneling: Area: (cm) 6.676 Undermining: Volume: (cm) 0.668 Wound Description Wound Margin: Distinct, outline attached Foul Odor Aft Exudate Amount: Medium Slough/Fibrin Exudate Type: Serosanguineous Exudate Color: red, brown Wound Bed Granulation Amount: Large (67-100%) Granulation Quality: Pink Fascia Expose Necrotic Amount: None Present (0%) Fat Layer (Su Tendon Expose Muscle Expose Joint Exposed Bone Exposed: er Cleansing: No o No Exposed Structure d: No bcutaneous Tissue) Exposed: Yes d: No d: No : No No n Area: 17.9% n Volume: 17.8% tion: Small (1-33%) No No Treatment Notes Wound #4 (Right Flank) 1. Cleanse With Wound Cleanser 2. Periwound Care Skin Prep 3. Primary Dressing Applied Calcium Alginate 4. Secondary Dressing ABD Pad Dry Gauze Drawtex 5. Secured With [employer] [facility] [employer]) Signed: 02/18/2019 5:46:04 PM By: Kela Millin Entered By: Kela Millin on 02/18/2019 10:48:34 -------------------------------------------------------------------------------- Vitals Details Patient Name: Date of Service: [name] [name]. 02/18/2019 9:30 AM Medical Record Number:[id number] Patient Account Number: [id number] Date of Birth/Sex: Treating RN: 25-Oct-1955 (63 y.o. [name] Primary Care Dianey Suchy: SYSTEM, PCP Other Clinician: Referring [name]: Treating  Saifan Rayford/Extender:Robson, Dulcy Fanny, Fuller Plan in Treatment: 68 Vital Signs Time Taken: 10:49 Temperature (F): 97.8 Height (in): 62 Pulse (bpm): 71 Weight (lbs): 260 Respiratory Rate (breaths/min): 20 Body Mass Index (BMI): 47.5 Blood Pressure (mmHg): 122/49 Reference Range: 80 - 120 mg / dl Electronic Signature(s) Signed: 02/18/2019 5:46:04 PM By: Kela Millin Entered By: Kela Millin on 02/18/2019 10:49:22

## 2019-02-18 NOTE — Progress Notes (Signed)
[name] [name], [name] [name] ([id number]) Visit Report for 02/18/2019 HPI Details Patient Name: Date of Service: [name] [name], [name] [name] 02/18/2019 9:30 AM Medical Record Number:[id number] Patient Account Number: [id number] Date of Birth/Sex: Treating RN: 06-Aug-1955 (63 y.o. F) Primary Care Provider: SYSTEM, PCP Other Clinician: Referring Provider: Treating Provider/Extender:Colleene Swarthout, Dulcy Fanny, Fuller Plan in Treatment: 4 History of Present Illness HPI Description: 06/29/17; this is a 63 year old woman who is a poor historian. It appears that she's had limited medical care over the last 2 years or so and what she had his through the [place]. She is here for review of a draining area on her right shoulder anteriorly as well as a nonhealing surgical wound in the left lower abdomen. Per her description she had a hysterectomy in 2006 4 "tumors". About 9 months later she had have another operation in the same area because of "tumors" involving her fallopian tubes and ovary. Sounds as though the surgical wound in this area that took a long time to heal. Apparently the area on her left lower abdomen is been open for the last 6 or 7 months. She is been applying topical antibiotics. 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 She had a fall in late 2018 and the open area on top of her shoulder. She is had draining pus coming out of this wound since she's been applying topical antibiotics and a Band-Aid. She is not complaining of a lot of pain or fever although she did have chills today. She saw her doctor at the [place] yesterday she doesn't know her name she apparently was referred to a surgeon at the [facility] and [place] presumably an orthopedic surgeon to look at that shoulder. She also has a primary doctor in [place] by the name of Dr. Jimmye Norman who referred her here after seeing her within the last month. 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 We don't have a lot of information in her in Epic. Last hospitalization was from 11/21/16 through 11/24/16 with hypoglycemia felt to be secondary to sulfonylureas. There is no mention of a wound at that time. 07/07/17; patient's shoulder cultured MSSA. I put her on clindamycin to had some anaerobic coverage with caution about diarrhea. I had asked her to make an orthopedic follow-up at the [place] with a doctor that made the original surgery but she has not done that yet. She also has a fairly large wound in the left lower abdomen in the middle of his surgical scar. This was apparently gynecologic surgery perhaps fibroid surgery. In any case these wounds are larger today. 07/21/17; the patient went to see the orthopedic doctor at the [facility] who did her original surgery surgery. Per the patient's description the draining area had closed down. He didn't think anything needed to be done there were no imaging studies. The lower abdominal wound in the left mid pelvis area also looks somewhat better to me. There is less surface material on this still some probing areas but not a lot of depth. We've been using silver alginate 08/03/17; the patient arrives back in 2 week follow-up. The draining sinus on her right shoulder has not reopened although she is complaining of widespread shoulder and other joint pain. She is using silver alginate to the remaining surgical wound on her abdomen. 08/18/17; the patient has 2 week follow-up appointment. The draining sinus on her right shoulder is not reopened. She is  complaining of left shoulder pain today. The presumed surgical wound on her lower abdomen looks quite a bit better and this is really contracted quite nicely. When she first came in the deep sinus she had on the anterior shoulder cultured MSSA. I gave her clindamycin. I arranged for orthopedic follow-up at the [facility] however this is already  closed nevertheless this was a deep probing tunnel and I think it needs to be watched for reopening. 09/07/17; surgical wound on her lower abdomen. This as 2 attached leaflets. The larger one is superiorly. A small probing area here with some sanguinous drainage. Surface needed debridement today. We've been using silver alginate however I changed this to silver collagen today 09/15/17. Surgical wound on her lower abdomen. No recent improvement although this is markedly improved from when we first started seeing her although the original problem was a wound on her right shoulder which is long since closed.switched her to Silver collagen last week She arrives in clinic today complaining of abdominal pain "so bad I almost went to the ER". She tells me she had an endoscopy and colonoscopy about a week ago. This was at the [place], I have no way to look at this. She is unaware of any biopsy was done 10/02/17 on evaluation today patient continues to do well in regard to her abdominal wound were using Prisma it's obviously taken some time for this to heal but nonetheless I do think that she is making good progress currently. There does not appear to be any evidence of infection at this time which is good news. She still does have some tunneling or undermining but again I think this is still doing well. 10/19/17; she continues to make good progress in this surgical abdominal wound using Prisma. This is come in nicely at one point had considerable depth. No evidence of infection. The patient is not complaining of pain in this area but is complaining of diabetic neuropathy pain in her right foot 11/02/17;the patient's wound is superficial and is really continued to make decent progress. I thought this would be more problematic when I first saw however this appears to a filled in nicely there is no depth. It exists in the crevice between what I think is lower abdominal hernias. She comes in complaining of generalized  pain however she is running out of her narcotics providedto her by a local pain clinic. She is asking for home health however she is [place] as I understand we cannot order this. 11/16/2017; 2-week follow-up. She has a lower abdominal surgical wound and this appears to be making nice progress closing in nicely each time we see it. This was initially a deep crevice surgical area between what I think is to lower abdominal hernias. In spite of this wound has healthy granulation. There does not appear to be any depth at all. There is probably a lot of tensile stress on the wound area however in spite of this things actually look better 11/30/2017; 2-week follow-up. She is complaining of left lower quadrant abdominal pain it is difficult to characterize this clearly episodic and not really associated with nausea or vomiting. She follows at the [place]. Tells me she was recently seen there by her new primary physician. The wound is in the lower abdomen in the middle of what I think was a gynecologic surgical site. We have made progress with this. 12/14/2017; 2-week follow-up. The patient comes in today with an improved wound area on the lower mid abdomen surgical scar which  was the wound we have been following her for. Paradoxically she has developed a new open area just to the right of the midline almost a mirror imaging wound on the right. She states this came up about a week ago. She does not have a clear explanation. Both of these wounds are in surgical scar areas although from her I think this surgery was remote. She denies scratching this or traumatizing it. It is surrounded by 2 large areas that I suspect are herniations but I still cannot see how this would have come from trauma. 12/28/2017; the area on the left surgical scar in the lower abdomen is better however the area on the right that we identified last time is worse. Increase in dimensions. There is no obvious infection here. We have been using  silver alginate. She tells me she has been to the ER at the [place] and they gave her doxycycline she also saw her primary doctor yesterday. I think she needs to be referred to dermatology consideration of a skin biopsy question panniculitis 01/11/2018; we continue to have improvement in the left lower mid abdominal wound however the area on the right that we identified in late October has gotten quite a bit worse. There is not appear to be surrounding infection although the patient is complaining of a lot of pain. We have been using silver alginate. [facility] I think this should help 01/24/18 on evaluation today patient actually apparently has two abdominal wounds the one on the right is new here and unfortunately seems to be doing a little bit worse and that it's a little bit larger. Fortunately there does not appear to be any sign of infection at this time. No fevers, chills, nausea, or vomiting noted at this time. Overall again it sounds like this is a fairly complex issue and despite the fact that we been using silver nitrate in the [facility] Dressing still has some hyper granulation. No fevers, chills, nausea, or vomiting noted at this time. 01/31/18 plan evaluation today patient appears to be doing better in general in regard to her wounds. She has been tolerating the dressing changes without complication. I do feel like that right now she's making some progress so she still is having some discomfort. 02/07/18 on evaluation today patient actually appears to be doing rather well in regard to her double ulcers as far as I'm concerned. With that being said she is somewhat frustrated in general in regard to the overall appearance of everything. I feel like this is unwarranted however since she seems to be making progress. 02/15/2018; lower mid abdomen right much greater than left although the left was the original wound. The area on the  left looks really quite good and is smaller. The area on the right seems longer and measures worse in terms of dimensions. These areas are in the most dependent part of her large abdominal pannus and this may be the entire issue. at [facility] on the left there is lymphedema around the wound. The patient is seeing dermatology next week apparently in [facility] 03/01/[facility]; she likely had abdomen. Right now he other than left ankle both he has better today. I think both of these areas are smaller. Surface looks healthier she is due to see dermatology later this month. Using [facility] 1/23; abdominal wounds in the lower mid abdomen. This is in a very dependent area in her lower abdominal pannus and the simple tension in  this area may have a lot to do with this. She also has constant chronic pruritus and scratching and I wonder if this has something to do with open wounds in this area. She tells me her daughter is helping her with the dressings currently and the original wound on the left side of her mid abdomen is better almost closed the larger area on the right also looks some better. She sees a dermatologist next week I have told her to ask the dermatologist for help with generalized pruritus. The probable skin wounds that she has all over secondary to scratching. The wounds we have been treating may be related to simple tension in a dependent lymphedematous area. She could benefit from an abdominal binder to help support this area 2/13; abdominal wounds in the lower mid abdomen. Very dependent in her abdomen and this may be secondary to tension in the lower part of her abdominal pannus plus or minus edema. 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 She claims not to be scratching at the wound areas. 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 They give her gentian violet to put on the wound area but this is already present and Hydrofera Blue. The original wound on the left is healed today. However the area on the right part of her mid abdominal pannus appears larger. We are going to try to work with her on getting an abdominal binder to help support the weight of the pannus around the wounds 3/9; abdominal wound in the lower mid abdomen. She apparently has an abdominal binder that she bought on [employer]. She is using Hydrofera Blue to the wound on the right of the midline. The original wound was on the left and is closed over. 3/26; abdominal wound in the lower mid abdomen. This is her second wound in this area. The lower part of her abdominal pannus surrounding surgical scar. Once again she is not wearing the abdominal binder she said she obtained. The dressing is Hydrofera Blue 4/16; abdomen in the right lower mid abdomen. This is the second wound in this area the other was a mirror image area on the left. I had her seen by dermatology at the [facility] this was not very helpful. She is not wearing her abdominal binder. We have been using Hydrofera Blue which is what healed at the other side. The abdominal wound is actually larger She arrives in clinic today complaining of the plantar right heel. Very tender. She has been pushing herself up in her bed with her heels and this may be a deep tissue injury. 4/23; abdomen wound is about the same. Spreading along the folds of her pannus somewhat but generally the surface looks somewhat better. She is not using her abdominal binder although she did bring it in this time She arrives in clinic complaining of pain and itching in the right lateral rib area. She has 3 open areas here which are circular wounds. These are new She also mentions her lower sacrum and there are 2 wounds in this area. These are new 5/7; abdominal wound is  about the same although in general it looks better than a few weeks ago at which time it had punched out depth. No debridement is required. She will not wear her abdominal binder She has  areas on her right lateral rib cage 3 open areas circular wounds these look about the same as last time. I am assuming these have something to do with the prurigo nodularis that she may have scratched but I am not certain. Finally she has a linear area on her lower sacrum which I am assuming is a pressure area 5/21-Patient returns at 2 weeks for abdominal wound, areas on the right lateral rib cage, and lower sacrum linear area which is a pressure sore. We have been using silver alginate to all these wounds. She has been asked to wear abdominal binder which she is not. she has 2 new wounds on right flank area and on her coccyx. she has a total of 6 wounds 6/9; I have been following this patient for a prolonged period of time for wounds on her lower mid abdomen which I think were secondary to severe lymphedema. She had an area on the left of the midline that eventually healed only to open on the right side. This is still open we have been using silver alginate. She will not use an abdominal binder. About a month ago she arrived here with open areas on the right lateral ribs. This is in the middle of a large amount of soft tissue/pannus fold. She has 4 open areas here at this time. Finally she has 2 open areas in her lower coccyx. We have been using silver alginate on all the wound areas 6/25; we are following this woman for wounds on her lower mid abdomen in the setting of a pannus and severe dependent lymphedema. More recently she has had areas in the folds on the right lateral rib area. Culture I did of this area last time was negative. Finally she has an area on the coccyx. We have been using silver alginate to all the wound areas 7/9; this is a patient with morbid obesity severe lymphedema in her lower abdominal  pannus/severe dependent abdominal lymphedema. She first came to this clinic with an area on the left lower central abdomen. This eventually healed over. About the same time as it did heal over she developed an area in close juxtaposition on the other side of the midline. We have been following this for some months when she was here last time I thought things were improving although she a lot arrives today with a larger wound. She will not use her external abdominal binder. Over the last several weeks she has had areas on her right lateral rib area. The exact etiology of this is unclear we have been using silver alginate. She arrives today with the entire area inflamed angry with loss of surface epithelium and marked pruritus. I think this is tinea/Candida. This could be secondary or the primary in the etiology of these expanding areas. 7/23; the area on the right lateral rib areas is quite a bit better. Skin that we treated with oral and topical antifungals looks a lot better around this area. Unfortunately the lower abdominal area actually measures larger. There is no drainage here really no surrounding erythema. 8/6-Patient arrives to the clinic at 3 weeks, the right lateral rib cage area has copious drainage but the wounds look a little better, the abdominal midline wound unfortunately is larger, she is not able to use the binder both wounds have a clean base 8/20. Since the patient was last here she was hospitalized from 813 through 818. This was predominantly because of hyperkalemia secondary to lisinopril and dehydration. Lisinopril was discontinued at  discharge. She was also felt to have mild cellulitis around several skin ulcers she was given antibiotics in the hospital but not discharged on antibiotics. Also noted that she has stage IV chronic kidney disease with a baseline creatinine at 1.7. 9/3 the patient still has a 2 wound areas lower mid abdominal pannus and the right lateral ribs.  We have been using polymen to the abdominal wound and silver alginate to the area on the folds on her right lateral ribs. She is complaining about pruritus around the areas on the ribs. 9/17; 2-week follow-up. The area on the mid lower abdomen better using PolyMem. She has 4 small areas in the pannus fold on the right lateral ribs and is a new area on the left lateral lower abdominal folds this week. 10/1; 2-week follow-up. She has been using PolyMem on the mid lower abdomen and really not much improvement. She has this for small areas in the pannus fold of the right lateral ribs. These are gradually I think improving. The area that was new last time on the left lower abdominal pannus has closed over. 10/15; 2-week follow-up. I changed to silver collagen on the mid lower abdomen wound which seems smaller today. She is still using silver alginate on the right lateral ribs and a pannus fold and this looks better today although she is complaining of itching in this area She has had an outbreak on her face which almost looks like cystic acne. She is going to see her primary doctor about this tomorrow 11/5; silver collagen on the mid lower abdomen which seems to be doing a good job we are using silver alginate in the pannus folds on her right lateral ribs. She is complaining of pruritus in this area which is probably intertrigo/Candida. I will prescribe nystatin powder 12/3. We are using silver collagen to her mid lower abdomen which looks better. The pannus area on the right lateral ribs we are using silver alginate she is pointing nystatin powder in here. The wounds look better. She is separating the folds with hand towels. We have suggested Interdry and she is going to look into getting that online 12/28; 1 month follow-up. In general the area on her mid lower abdomen looks a lot better. Surface area is smaller we have been using silver collagen. The pannus area on the right lateral ribs we have  been using alginate. She also is putting Interdry in these areas. Electronic Signature(s) Signed: 02/18/2019 6:10:19 PM By: Linton Ham MD Entered By: Linton Ham on 02/18/2019 12:47:14 -------------------------------------------------------------------------------- Physical Exam Details Patient Name: Date of Service: [name] [name], [name] [name] 02/18/2019 9:30 AM Medical Record [id number] Patient Account Number: [id number] Date of Birth/Sex: Treating RN: 1955-11-10 (63 y.o. F) Primary Care Provider: SYSTEM, PCP Other Clinician: Referring Provider: Treating Provider/Extender:Graeme Menees, Dulcy Fanny, Fuller Plan in Treatment: 85 Constitutional Sitting or standing Blood Pressure is within target range for patient.. Pulse regular and within target range for patient.Marland Kitchen Respirations regular, non-labored and within target range.. Temperature is normal and within the target range for the patient.Marland Kitchen Appears in no distress. Eyes Conjunctivae clear. No discharge.no icterus. Respiratory work of breathing is normal. Cardiovascular Appears euvolemic. Integumentary (Hair, Skin) Multiple skin excoriations from prurigo nodularis. I see no evidence of intertrigo at this is important. Psychiatric appears at normal baseline. Notes Wound exam; midline abdomen. This is a lot better. No debridement is required there is still some depth but rims of epithelialization. Surface of the wound looks stable. The area on the right lateral  ribs has 3 open areas. There is no evidence of fungal infection here. The area seems smaller and Electronic Signature(s) Signed: 02/18/2019 6:10:19 PM By: Linton Ham MD Entered By: Linton Ham on 02/18/2019 12:50:10 -------------------------------------------------------------------------------- Physician Orders Details Patient Name: Date of Service: [name] [name], [name] [name] 02/18/2019 9:30 AM Medical Record Number:[id number] Patient Account Number:  [id number] Date of Birth/Sex: Treating RN: [birth year] (63 y.o. [name] Primary Care Provider: SYSTEM, PCP Other Clinician: Referring Provider: Treating Provider/Extender:Amelita Risinger, Dulcy Fanny, Fuller Plan in Treatment: 9 Verbal / Phone Orders: No Diagnosis Coding ICD-10 Coding Code Description Disruption of external operation (surgical) wound, not elsewhere classified, subsequent T81.31XD encounter Unspecified open wound of abdominal wall, left lower quadrant without penetration into peritoneal S31.104D cavity, subsequent encounter L98.498 Non-pressure chronic ulcer of skin of other sites with other specified severity I89.0 Lymphedema, not elsewhere classified B35.4 Tinea corporis Follow-up Appointments Return appointment in 1 month. Dressing Change Frequency Wound #3 Right Abdomen - Lower Quadrant Other: - change dressing twice a week. Wound #4 Right Flank Other: - change dressing twice a week. Skin Barriers/Peri-Wound Care Wound #3 Right Abdomen - Lower Quadrant Skin Prep Wound #4 Right Flank Skin Prep Wound Cleansing May shower and wash wound with soap and water. Primary Wound Dressing Wound #3 Right Abdomen - Lower Quadrant Collagen - moisten with hydrogel Wound #4 Right Flank Calcium Alginate Secondary Dressing Wound #3 Right Abdomen - Lower Quadrant Dry Gauze ABD pad - secure with tape Wound #4 Right Flank ABD pad Drawtex Off-Loading Turn and reposition every 2 hours Other: - abdominal binder to support abdomen. Patient to float right heel while in chair or bed with pillow. Patient to keep pressure off right heel as much as possible. Roll up a face cloth or a pillowcase to separate all skin folds- under breast, abdomen, both flank areas. Additional Orders / Instructions Other: - ***[facility] INTERDRY 10"x 12 feet roll or patient to purchase offline from [employer].*** [facility]  for wound care. - [facility] change once a week on mondays when patient comes to wound center on every other Thursdays. Home Health to change twice a week every other week Mondays and Thursdays when patient is not at wound center. Electronic Signature(s) Signed: 02/18/2019 6:10:06 PM By: Levan Hurst RN, BSN Signed: 02/18/2019 6:10:19 PM By: Linton Ham MD Entered By: Levan Hurst on 02/18/2019 11:01:39 -------------------------------------------------------------------------------- Problem List Details Patient Name: Date of Service: [name] [name], [name] [name] 02/18/2019 9:30 AM Medical Record Number:[id number] Patient Account Number: [id number] Date of Birth/Sex: Treating RN: 1955/12/24 (63 y.o. [name] Primary Care Provider: SYSTEM, PCP Other Clinician: Referring Provider: Treating Provider/Extender:Quincey Nored, Dulcy Fanny, Fuller Plan in Treatment: 85 Active Problems ICD-10 Evaluated Encounter Code Description Active Date Today Diagnosis T81.31XD Disruption of external operation (surgical) wound, not 06/29/2017 No Yes elsewhere classified, subsequent encounter S31.104D Unspecified open wound of abdominal wall, left lower 06/29/2017 No Yes quadrant without penetration into peritoneal cavity, subsequent encounter L98.498 Non-pressure chronic ulcer of skin of other sites with 06/14/2018 No Yes other specified severity I89.0 Lymphedema, not elsewhere classified 08/30/2018 No Yes B35.4 Tinea corporis 08/30/2018 No Yes Inactive Problems ICD-10 Code Description Active Date Inactive Date M71.011 Abscess of bursa, right shoulder 06/29/2017 06/29/2017 L89.616 Pressure-induced deep tissue damage of right heel 06/07/2018 06/07/2018 L89.150 Pressure ulcer of sacral region, unstageable 06/14/2018 06/14/2018 Resolved Problems Electronic Signature(s) Signed: 02/18/2019 6:10:19 PM By: Linton Ham MD Entered By: Linton Ham on 02/18/2019  12:46:20 --------------------------------------------------------------------------------  Progress Note Details Patient Name: Date of Service: [name] [name], [name] [name] 02/18/2019 9:30 AM Medical Record Number:[id number] Patient Account Number: [id number] Date of Birth/Sex: Treating RN: 1955/05/01 (63 y.o. F) Primary Care Provider: SYSTEM, PCP Other Clinician: Referring Provider: Treating Provider/Extender:Kariss Longmire, Dulcy Fanny, Fuller Plan in Treatment: 85 Subjective History of Present Illness (HPI) 06/29/17; this is a 63 year old woman who is a poor historian. It appears that she's had limited medical care over the last 2 years or so and what she had his through the [place]. She is here for review of a draining area on her right shoulder anteriorly as well as a nonhealing surgical wound in the left lower abdomen. Per her description she had a hysterectomy in 2006 4 "tumors". About 9 months later she had have another operation in the same area because of "tumors" involving her fallopian tubes and ovary. Sounds as though the surgical wound in this area that took a long time to heal. Apparently the area on her left lower abdomen is been open for the last 6 or 7 months. She is been applying topical antibiotics. 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 She had a fall in late 2018 and the open area on top of her shoulder. She is had draining pus coming out of this wound since she's been applying topical antibiotics and a Band-Aid. She is not complaining of a lot of pain or fever although she did have chills today. She saw her doctor at the [place] yesterday she doesn't know her name she apparently was referred to a surgeon at the [facility] and [place] presumably an orthopedic surgeon to look at that shoulder. She also has a primary doctor in [place] by the name of Dr. Jimmye Norman who referred her here after seeing her  within the last month. 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 We don't have a lot of information in her in Epic. Last hospitalization was from 11/21/16 through 11/24/16 with hypoglycemia felt to be secondary to sulfonylureas. There is no mention of a wound at that time. 07/07/17; patient's shoulder cultured MSSA. I put her on clindamycin to had some anaerobic coverage with caution about diarrhea. I had asked her to make an orthopedic follow-up at the [place] with a doctor that made the original surgery but she has not done that yet. ooShe also has a fairly large wound in the left lower abdomen in the middle of his surgical scar. This was apparently gynecologic surgery perhaps fibroid surgery. In any case these wounds are larger today. 07/21/17; the patient went to see the orthopedic doctor at the [facility] who did her original surgery surgery. Per the patient's description the draining area had closed down. He didn't think anything needed to be done there were no imaging studies. ooThe lower abdominal wound in the left mid pelvis area also looks somewhat better to me. There is less surface material on this still some probing areas but not a lot of depth. We've been using silver alginate 08/03/17; the patient arrives back in 2 week follow-up. The draining sinus on her right shoulder has not reopened although she is complaining of widespread shoulder and other joint pain. She is using silver alginate to the remaining surgical wound on her abdomen. 08/18/17; the patient has 2 week follow-up appointment. The draining sinus on her right shoulder is not reopened. She is complaining of left shoulder pain today. The  presumed surgical wound on her lower abdomen looks quite a bit better and this is really contracted quite nicely. When she first came in the deep sinus she had on the anterior shoulder cultured MSSA. I gave her clindamycin. I arranged for  orthopedic follow-up at the [facility] however this is already closed nevertheless this was a deep probing tunnel and I think it needs to be watched for reopening. 09/07/17; surgical wound on her lower abdomen. This as 2 attached leaflets. The larger one is superiorly. A small probing area here with some sanguinous drainage. Surface needed debridement today. We've been using silver alginate however I changed this to silver collagen today 09/15/17. Surgical wound on her lower abdomen. No recent improvement although this is markedly improved from when we first started seeing her although the original problem was a wound on her right shoulder which is long since closed.switched her to Silver collagen last week She arrives in clinic today complaining of abdominal pain "so bad I almost went to the ER". She tells me she had an endoscopy and colonoscopy about a week ago. This was at the [place], I have no way to look at this. She is unaware of any biopsy was done 10/02/17 on evaluation today patient continues to do well in regard to her abdominal wound were using Prisma it's obviously taken some time for this to heal but nonetheless I do think that she is making good progress currently. There does not appear to be any evidence of infection at this time which is good news. She still does have some tunneling or undermining but again I think this is still doing well. 10/19/17; she continues to make good progress in this surgical abdominal wound using Prisma. This is come in nicely at one point had considerable depth. No evidence of infection. The patient is not complaining of pain in this area but is complaining of diabetic neuropathy pain in her right foot 11/02/17;the patient's wound is superficial and is really continued to make decent progress. I thought this would be more problematic when I first saw however this appears to a filled in nicely there is no depth. It exists in the crevice between what I think is lower  abdominal hernias. She comes in complaining of generalized pain however she is running out of her narcotics providedto her by a local pain clinic. She is asking for home health however she is [place] as I understand we cannot order this. 11/16/2017; 2-week follow-up. She has a lower abdominal surgical wound and this appears to be making nice progress closing in nicely each time we see it. This was initially a deep crevice surgical area between what I think is to lower abdominal hernias. In spite of this wound has healthy granulation. There does not appear to be any depth at all. There is probably a lot of tensile stress on the wound area however in spite of this things actually look better 11/30/2017; 2-week follow-up. She is complaining of left lower quadrant abdominal pain it is difficult to characterize this clearly episodic and not really associated with nausea or vomiting. She follows at the [place]. Tells me she was recently seen there by her new primary physician. The wound is in the lower abdomen in the middle of what I think was a gynecologic surgical site. We have made progress with this. 12/14/2017; 2-week follow-up. The patient comes in today with an improved wound area on the lower mid abdomen surgical scar which was the wound we have been following  her for. Paradoxically she has developed a new open area just to the right of the midline almost a mirror imaging wound on the right. She states this came up about a week ago. She does not have a clear explanation. Both of these wounds are in surgical scar areas although from her I think this surgery was remote. She denies scratching this or traumatizing it. It is surrounded by 2 large areas that I suspect are herniations but I still cannot see how this would have come from trauma. 12/28/2017; the area on the left surgical scar in the lower abdomen is better however the area on the right that we identified last time is worse. Increase in dimensions.  There is no obvious infection here. We have been using silver alginate. She tells me she has been to the ER at the [place] and they gave her doxycycline she also saw her primary doctor yesterday. I think she needs to be referred to dermatology consideration of a skin biopsy question panniculitis 01/11/2018; we continue to have improvement in the left lower mid abdominal wound however the area on the right that we identified in late October has gotten quite a bit worse. There is not appear to be surrounding infection although the patient is complaining of a lot of pain. We have been using silver alginate. [facility] I think this should help 01/24/18 on evaluation today patient actually apparently has two abdominal wounds the one on the right is new here and unfortunately seems to be doing a little bit worse and that it's a little bit larger. Fortunately there does not appear to be any sign of infection at this time. No fevers, chills, nausea, or vomiting noted at this time. Overall again it sounds like this is a fairly complex issue and despite the fact that we been using silver nitrate in the [facility] Dressing still has some hyper granulation. No fevers, chills, nausea, or vomiting noted at this time. 01/31/18 plan evaluation today patient appears to be doing better in general in regard to her wounds. She has been tolerating the dressing changes without complication. I do feel like that right now she's making some progress so she still is having some discomfort. 02/07/18 on evaluation today patient actually appears to be doing rather well in regard to her double ulcers as far as I'm concerned. With that being said she is somewhat frustrated in general in regard to the overall appearance of everything. I feel like this is unwarranted however since she seems to be making progress. 02/15/2018; lower mid abdomen right much greater than left  although the left was the original wound. The area on the left looks really quite good and is smaller. The area on the right seems longer and measures worse in terms of dimensions. These areas are in the most dependent part of her large abdominal pannus and this may be the entire issue. at [facility] on the left there is lymphedema around the wound. The patient is seeing dermatology next week apparently in [facility] 03/01/2017; she likely had abdomen. Right now he other than left ankle both he has better today. I think both of these areas are smaller. Surface looks healthier she is due to see dermatology later this month. Using [facility] 1/23; abdominal wounds in the lower mid abdomen. This is in a very dependent area in her lower abdominal pannus and the simple tension in this area may have a lot to  do with this. She also has constant chronic pruritus and scratching and I wonder if this has something to do with open wounds in this area. She tells me her daughter is helping her with the dressings currently and the original wound on the left side of her mid abdomen is better almost closed the larger area on the right also looks some better. She sees a dermatologist next week I have told her to ask the dermatologist for help with generalized pruritus. The probable skin wounds that she has all over secondary to scratching. The wounds we have been treating may be related to simple tension in a dependent lymphedematous area. She could benefit from an abdominal binder to help support this area 2/13; abdominal wounds in the lower mid abdomen. Very dependent in her abdomen and this may be secondary to tension in the lower part of her abdominal pannus plus or minus edema. 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 She claims not to be scratching at the wound areas. 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 They give her gentian violet to put on the wound area but this is already present and Hydrofera Blue. The original wound on the left is healed today. However the area on the right part of her mid abdominal pannus appears larger. We are going to try to work with her on getting an abdominal binder to help support the weight of the pannus around the wounds 3/9; abdominal wound in the lower mid abdomen. She apparently has an abdominal binder that she bought on [employer]. She is using Hydrofera Blue to the wound on the right of the midline. The original wound was on the left and is closed over. 3/26; abdominal wound in the lower mid abdomen. This is her second wound in this area. The lower part of her abdominal pannus surrounding surgical scar. Once again she is not wearing the abdominal binder she said she obtained. The dressing is Hydrofera Blue 4/16; abdomen in the right lower mid abdomen. This is the second wound in this area the other was a mirror image area on the left. I had her seen by dermatology at the [facility] this was not very helpful. She is not wearing her abdominal binder. We have been using Hydrofera Blue which is what healed at the other side. The abdominal wound is actually larger She arrives in clinic today complaining of the plantar right heel. Very tender. She has been pushing herself up in her bed with her heels and this may be a deep tissue injury. 4/23; abdomen wound is about the same. Spreading along the folds of her pannus somewhat but generally the surface looks somewhat better. She is not using her abdominal binder although she did bring it in this time ooShe arrives in clinic complaining of pain and itching in the right lateral rib area. She has 3 open areas here which are circular wounds. These are new ooShe also mentions her lower sacrum and there are 2  wounds in this area. These are new 5/7; abdominal wound is about the same although in general it looks better than a few weeks ago at which time it had punched out depth. No debridement is required. She will not wear her abdominal binder ooShe has areas on her right lateral rib cage  3 open areas circular wounds these look about the same as last time. I am assuming these have something to do with the prurigo nodularis that she may have scratched but I am not certain. Finally she has a linear area on her lower sacrum which I am assuming is a pressure area 5/21-Patient returns at 2 weeks for abdominal wound, areas on the right lateral rib cage, and lower sacrum linear area which is a pressure sore. We have been using silver alginate to all these wounds. She has been asked to wear abdominal binder which she is not. she has 2 new wounds on right flank area and on her coccyx. she has a total of 6 wounds 6/9; I have been following this patient for a prolonged period of time for wounds on her lower mid abdomen which I think were secondary to severe lymphedema. She had an area on the left of the midline that eventually healed only to open on the right side. This is still open we have been using silver alginate. She will not use an abdominal binder. About a month ago she arrived here with open areas on the right lateral ribs. This is in the middle of a large amount of soft tissue/pannus fold. She has 4 open areas here at this time. Finally she has 2 open areas in her lower coccyx. We have been using silver alginate on all the wound areas 6/25; we are following this woman for wounds on her lower mid abdomen in the setting of a pannus and severe dependent lymphedema. More recently she has had areas in the folds on the right lateral rib area. Culture I did of this area last time was negative. Finally she has an area on the coccyx. We have been using silver alginate to all the wound areas 7/9; this is a  patient with morbid obesity severe lymphedema in her lower abdominal pannus/severe dependent abdominal lymphedema. She first came to this clinic with an area on the left lower central abdomen. This eventually healed over. About the same time as it did heal over she developed an area in close juxtaposition on the other side of the midline. We have been following this for some months when she was here last time I thought things were improving although she a lot arrives today with a larger wound. She will not use her external abdominal binder. Over the last several weeks she has had areas on her right lateral rib area. The exact etiology of this is unclear we have been using silver alginate. She arrives today with the entire area inflamed angry with loss of surface epithelium and marked pruritus. I think this is tinea/Candida. This could be secondary or the primary in the etiology of these expanding areas. 7/23; the area on the right lateral rib areas is quite a bit better. Skin that we treated with oral and topical antifungals looks a lot better around this area. Unfortunately the lower abdominal area actually measures larger. There is no drainage here really no surrounding erythema. 8/6-Patient arrives to the clinic at 3 weeks, the right lateral rib cage area has copious drainage but the wounds look a little better, the abdominal midline wound unfortunately is larger, she is not able to use the binder both wounds have a clean base 8/20. Since the patient was last here she was hospitalized from 813 through 818. This was predominantly because of hyperkalemia secondary to lisinopril and dehydration. Lisinopril was discontinued at discharge. She was also felt to have  mild cellulitis around several skin ulcers she was given antibiotics in the hospital but not discharged on antibiotics. Also noted that she has stage IV chronic kidney disease with a baseline creatinine at 1.7. 9/3 the patient still has a  2 wound areas lower mid abdominal pannus and the right lateral ribs. We have been using polymen to the abdominal wound and silver alginate to the area on the folds on her right lateral ribs. She is complaining about pruritus around the areas on the ribs. 9/17; 2-week follow-up. The area on the mid lower abdomen better using PolyMem. She has 4 small areas in the pannus fold on the right lateral ribs and is a new area on the left lateral lower abdominal folds this week. 10/1; 2-week follow-up. She has been using PolyMem on the mid lower abdomen and really not much improvement. She has this for small areas in the pannus fold of the right lateral ribs. These are gradually I think improving. The area that was new last time on the left lower abdominal pannus has closed over. 10/15; 2-week follow-up. I changed to silver collagen on the mid lower abdomen wound which seems smaller today. She is still using silver alginate on the right lateral ribs and a pannus fold and this looks better today although she is complaining of itching in this area She has had an outbreak on her face which almost looks like cystic acne. She is going to see her primary doctor about this tomorrow 11/5; silver collagen on the mid lower abdomen which seems to be doing a good job we are using silver alginate in the pannus folds on her right lateral ribs. She is complaining of pruritus in this area which is probably intertrigo/Candida. I will prescribe nystatin powder 12/3. We are using silver collagen to her mid lower abdomen which looks better. The pannus area on the right lateral ribs we are using silver alginate she is pointing nystatin powder in here. The wounds look better. She is separating the folds with hand towels. We have suggested Interdry and she is going to look into getting that online 12/28; 1 month follow-up. In general the area on her mid lower abdomen looks a lot better. Surface area is smaller we have been using  silver collagen. The pannus area on the right lateral ribs we have been using alginate. She also is putting Interdry in these areas. Objective Constitutional Sitting or standing Blood Pressure is within target range for patient.. Pulse regular and within target range for patient.Marland Kitchen Respirations regular, non-labored and within target range.. Temperature is normal and within the target range for the patient.Marland Kitchen Appears in no distress. Vitals Time Taken: 10:49 AM, Height: 62 in, Weight: 260 lbs, BMI: 47.5, Temperature: 97.8 F, Pulse: 71 bpm, Respiratory Rate: 20 breaths/min, Blood Pressure: 122/49 mmHg. Eyes Conjunctivae clear. No discharge.no icterus. Respiratory work of breathing is normal. Cardiovascular Appears euvolemic. Psychiatric appears at normal baseline. General Notes: Wound exam; midline abdomen. This is a lot better. No debridement is required there is still some depth but rims of epithelialization. Surface of the wound looks stable. ooThe area on the right lateral ribs has 3 open areas. There is no evidence of fungal infection here. The area seems smaller and Integumentary (Hair, Skin) Multiple skin excoriations from prurigo nodularis. I see no evidence of intertrigo at this is important. Wound #3 status is Open. Original cause of wound was Gradually Appeared. The wound is located on the Right Abdomen - Lower Quadrant. The wound measures  6cm length x 2.4cm width x 0.1cm depth; 11.31cm^2 area and 1.131cm^3 volume. There is Fat Layer (Subcutaneous Tissue) Exposed exposed. There is no tunneling or undermining noted. There is a medium amount of serosanguineous drainage noted. The wound margin is distinct with the outline attached to the wound base. There is large (67-100%) red, pink granulation within the wound bed. There is no necrotic tissue within the wound bed. Wound #4 status is Open. Original cause of wound was Gradually Appeared. The wound is located on the Right Flank. The  wound measures 1cm length x 8.5cm width x 0.1cm depth; 6.676cm^2 area and 0.668cm^3 volume. There is Fat Layer (Subcutaneous Tissue) Exposed exposed. There is no tunneling or undermining noted. There is a medium amount of serosanguineous drainage noted. The wound margin is distinct with the outline attached to the wound base. There is large (67-100%) pink granulation within the wound bed. There is no necrotic tissue within the wound bed. Assessment Active Problems ICD-10 Disruption of external operation (surgical) wound, not elsewhere classified, subsequent encounter Unspecified open wound of abdominal wall, left lower quadrant without penetration into peritoneal cavity, subsequent encounter Non-pressure chronic ulcer of skin of other sites with other specified severity Lymphedema, not elsewhere classified Tinea corporis Plan Follow-up Appointments: Return appointment in 1 month. Dressing Change Frequency: Wound #3 Right Abdomen - Lower Quadrant: Other: - change dressing twice a week. Wound #4 Right Flank: Other: - change dressing twice a week. Skin Barriers/Peri-Wound Care: Wound #3 Right Abdomen - Lower Quadrant: Skin Prep Wound #4 Right Flank: Skin Prep Wound Cleansing: May shower and wash wound with soap and water. Primary Wound Dressing: Wound #3 Right Abdomen - Lower Quadrant: Collagen - moisten with hydrogel Wound #4 Right Flank: Calcium Alginate Secondary Dressing: Wound #3 Right Abdomen - Lower Quadrant: Dry Gauze ABD pad - secure with tape Wound #4 Right Flank: ABD pad Drawtex Off-Loading: Turn and reposition every 2 hours Other: - abdominal binder to support abdomen. Patient to float right heel while in chair or bed with pillow. Patient to keep pressure off right heel as much as possible. Roll up a face cloth or a pillowcase to separate all skin folds- under breast, abdomen, both flank areas. Additional Orders / Instructions: Other: - ***[facility]DRY 10"x 12 feet roll or patient to purchase offline from [employer].*** Home Health: [facility] for [facility]. - [facility] change once a week on mondays when patient comes to wound center on every other Thursdays. Home Health to change twice a week every other week Mondays and Thursdays when patient is not at wound center. 1. I think we can continue with the same dressings which is largely an alginate to the wounds in the right thoracic pannus area. I think the Interdry has made a big difference here as well. 2. Continue silver collagen to the lower abdomen 3. I see no reason for antifungal powder at this point. Electronic Signature(s) Signed: 02/18/2019 6:10:19 PM By: Linton Ham MD Entered By: Linton Ham on 02/18/2019 12:51:05 -------------------------------------------------------------------------------- SuperBill Details Patient Name: Date of Service: [name] [name] 02/18/2019 Medical Record Number:[id number] Patient Account Number: [id number] Date of Birth/Sex: Treating RN: 1956/01/30 (63 y.o. F) Primary Care Provider: SYSTEM, PCP Other Clinician: Referring Provider: Treating Provider/Extender:Halden Phegley, Dulcy Fanny, Fuller Plan in Treatment: 85 Diagnosis Coding ICD-10 Codes Code Description Disruption of external operation (surgical) wound, not elsewhere classified, subsequent T81.31XD encounter Unspecified open wound of abdominal wall, left lower quadrant  without penetration into peritoneal S31.104D cavity, subsequent encounter L98.498 Non-pressure chronic ulcer of skin of other sites with other specified severity I89.0 Lymphedema, not elsewhere classified B35.4 Tinea corporis Facility Procedures The patient participates with Medicare or their insurance follows the Medicare Facility Guidelines: CPT4 Code Description Modifier Quantity [id number] [place] VISIT-LEV 4 EST PT 1 Physician  Procedures CPT4 Description: Code [id number] - WC PHYS LEVEL 3 - EST PT ICD-10 Diagnosis Description T81.31XD Disruption of external operation (surgical) wound, not elsewh subsequent encounter S31.104D Unspecified open wound of abdominal wall, left lower  quadrant into peritoneal cavity, subsequent encounter L98.498 Non-pressure chronic ulcer of skin of other sites with other Modifier: ere classified without penet specified seve Quantity: 1 , ration rity Electronic Signature(s) Signed: 02/18/2019 6:10:06 PM By: Levan Hurst RN, BSN Signed: 02/18/2019 6:10:19 PM By: Linton Ham MD Entered By: Levan Hurst on 02/18/2019 17:51:10

## 2019-03-18 ENCOUNTER — Other Ambulatory Visit: Payer: Self-pay

## 2019-03-18 ENCOUNTER — Encounter (HOSPITAL_BASED_OUTPATIENT_CLINIC_OR_DEPARTMENT_OTHER): Payer: Medicare Other | Attending: Internal Medicine | Admitting: Internal Medicine

## 2019-03-18 DIAGNOSIS — X58XXXD Exposure to other specified factors, subsequent encounter: Secondary | ICD-10-CM | POA: Diagnosis not present

## 2019-03-18 DIAGNOSIS — L98498 Non-pressure chronic ulcer of skin of other sites with other specified severity: Secondary | ICD-10-CM | POA: Insufficient documentation

## 2019-03-18 DIAGNOSIS — M109 Gout, unspecified: Secondary | ICD-10-CM | POA: Insufficient documentation

## 2019-03-18 DIAGNOSIS — N186 End stage renal disease: Secondary | ICD-10-CM | POA: Diagnosis not present

## 2019-03-18 DIAGNOSIS — I120 Hypertensive chronic kidney disease with stage 5 chronic kidney disease or end stage renal disease: Secondary | ICD-10-CM | POA: Insufficient documentation

## 2019-03-18 DIAGNOSIS — I129 Hypertensive chronic kidney disease with stage 1 through stage 4 chronic kidney disease, or unspecified chronic kidney disease: Secondary | ICD-10-CM | POA: Diagnosis not present

## 2019-03-18 DIAGNOSIS — N184 Chronic kidney disease, stage 4 (severe): Secondary | ICD-10-CM | POA: Insufficient documentation

## 2019-03-18 DIAGNOSIS — I890 Lymphedema, not elsewhere classified: Secondary | ICD-10-CM | POA: Diagnosis not present

## 2019-03-18 DIAGNOSIS — E1122 Type 2 diabetes mellitus with diabetic chronic kidney disease: Secondary | ICD-10-CM | POA: Insufficient documentation

## 2019-03-18 DIAGNOSIS — Z6841 Body Mass Index (BMI) 40.0 and over, adult: Secondary | ICD-10-CM | POA: Insufficient documentation

## 2019-03-18 DIAGNOSIS — B354 Tinea corporis: Secondary | ICD-10-CM | POA: Insufficient documentation

## 2019-03-18 DIAGNOSIS — E1140 Type 2 diabetes mellitus with diabetic neuropathy, unspecified: Secondary | ICD-10-CM | POA: Insufficient documentation

## 2019-03-18 DIAGNOSIS — S31104D Unspecified open wound of abdominal wall, left lower quadrant without penetration into peritoneal cavity, subsequent encounter: Secondary | ICD-10-CM | POA: Insufficient documentation

## 2019-03-18 DIAGNOSIS — E1136 Type 2 diabetes mellitus with diabetic cataract: Secondary | ICD-10-CM | POA: Diagnosis not present

## 2019-03-18 DIAGNOSIS — E11649 Type 2 diabetes mellitus with hypoglycemia without coma: Secondary | ICD-10-CM | POA: Insufficient documentation

## 2019-03-18 DIAGNOSIS — M069 Rheumatoid arthritis, unspecified: Secondary | ICD-10-CM | POA: Insufficient documentation

## 2019-03-18 DIAGNOSIS — T8131XD Disruption of external operation (surgical) wound, not elsewhere classified, subsequent encounter: Secondary | ICD-10-CM | POA: Insufficient documentation

## 2019-03-18 DIAGNOSIS — G4730 Sleep apnea, unspecified: Secondary | ICD-10-CM | POA: Insufficient documentation

## 2019-03-19 NOTE — Progress Notes (Signed)
[name] [name], [name] [name] ([id number]) Visit Report for 03/18/2019 HPI Details Patient Name: Date of Service: [name] [name], [name] [name] 03/18/2019 10:00 AM Medical Record Number:[id number] Patient Account Number: [id number] Date of Birth/Sex: Treating RN: 1955/07/03 (64 y.o. F) Primary Care Provider: SYSTEM, PCP Other Clinician: Referring Provider: Treating Provider/Extender:Makhari Dovidio, Dulcy Fanny, Fuller Plan in Treatment: 89 History of Present Illness HPI Description: 06/29/17; this is a 64 year old woman who is a poor historian. It appears that she's had limited medical care over the last 2 years or so and what she had his through the [place]. She is here for review of a draining area on her right shoulder anteriorly as well as a nonhealing surgical wound in the left lower abdomen. Per her description she had a hysterectomy in 2006 4 "tumors". About 9 months later she had have another operation in the same area because of "tumors" involving her fallopian tubes and ovary. Sounds as though the surgical wound in this area that took a long time to heal. Apparently the area on her left lower abdomen is been open for the last 6 or 7 months. She is been applying topical antibiotics. 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 She had a fall in late 2018 and the open area on top of her shoulder. She is had draining pus coming out of this wound since she's been applying topical antibiotics and a Band-Aid. She is not complaining of a lot of pain or fever although she did have chills today. She saw her doctor at the [place] yesterday she doesn't know her name she apparently was referred to a surgeon at the [facility] and [place] presumably an orthopedic surgeon to look at that shoulder. She also has a primary doctor in [place] by the name of Dr. Jimmye Norman who referred her here after seeing her within the last month. 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 We don't have a lot of information in her in Epic. Last hospitalization was from 11/21/16 through 11/24/16 with hypoglycemia felt to be secondary to sulfonylureas. There is no mention of a wound at that time. 07/07/17; patient's shoulder cultured MSSA. I put her on clindamycin to had some anaerobic coverage with caution about diarrhea. I had asked her to make an orthopedic follow-up at the [place] with a doctor that made the original surgery but she has not done that yet. She also has a fairly large wound in the left lower abdomen in the middle of his surgical scar. This was apparently gynecologic surgery perhaps fibroid surgery. In any case these wounds are larger today. 07/21/17; the patient went to see the orthopedic doctor at the [facility] who did her original surgery surgery. Per the patient's description the draining area had closed down. He didn't think anything needed to be done there were no imaging studies. The lower abdominal wound in the left mid pelvis area also looks somewhat better to me. There is less surface material on this still some probing areas but not a lot of depth. We've been using silver alginate 08/03/17; the patient arrives back in 2 week follow-up. The draining sinus on her right shoulder has not reopened although she is complaining of widespread shoulder and other joint pain. She is using silver alginate to the remaining surgical wound on her abdomen. 08/18/17; the patient has 2 week follow-up appointment. The draining sinus on her right shoulder is not reopened. She is  complaining of left shoulder pain today. The presumed surgical wound on her lower abdomen looks quite a bit better and this is really contracted quite nicely. When she first came in the deep sinus she had on the anterior shoulder cultured MSSA. I gave her clindamycin. I arranged for orthopedic follow-up at the [facility] however this is already  closed nevertheless this was a deep probing tunnel and I think it needs to be watched for reopening. 09/07/17; surgical wound on her lower abdomen. This as 2 attached leaflets. The larger one is superiorly. A small probing area here with some sanguinous drainage. Surface needed debridement today. We've been using silver alginate however I changed this to silver collagen today 09/15/17. Surgical wound on her lower abdomen. No recent improvement although this is markedly improved from when we first started seeing her although the original problem was a wound on her right shoulder which is long since closed.switched her to Silver collagen last week She arrives in clinic today complaining of abdominal pain "so bad I almost went to the ER". She tells me she had an endoscopy and colonoscopy about a week ago. This was at the [place], I have no way to look at this. She is unaware of any biopsy was done 10/02/17 on evaluation today patient continues to do well in regard to her abdominal wound were using Prisma it's obviously taken some time for this to heal but nonetheless I do think that she is making good progress currently. There does not appear to be any evidence of infection at this time which is good news. She still does have some tunneling or undermining but again I think this is still doing well. 10/19/17; she continues to make good progress in this surgical abdominal wound using Prisma. This is come in nicely at one point had considerable depth. No evidence of infection. The patient is not complaining of pain in this area but is complaining of diabetic neuropathy pain in her right foot 11/02/17;the patient's wound is superficial and is really continued to make decent progress. I thought this would be more problematic when I first saw however this appears to a filled in nicely there is no depth. It exists in the crevice between what I think is lower abdominal hernias. She comes in complaining of generalized  pain however she is running out of her narcotics providedto her by a local pain clinic. She is asking for home health however she is [place] as I understand we cannot order this. 11/16/2017; 2-week follow-up. She has a lower abdominal surgical wound and this appears to be making nice progress closing in nicely each time we see it. This was initially a deep crevice surgical area between what I think is to lower abdominal hernias. In spite of this wound has healthy granulation. There does not appear to be any depth at all. There is probably a lot of tensile stress on the wound area however in spite of this things actually look better 11/30/2017; 2-week follow-up. She is complaining of left lower quadrant abdominal pain it is difficult to characterize this clearly episodic and not really associated with nausea or vomiting. She follows at the [place]. Tells me she was recently seen there by her new primary physician. The wound is in the lower abdomen in the middle of what I think was a gynecologic surgical site. We have made progress with this. 12/14/2017; 2-week follow-up. The patient comes in today with an improved wound area on the lower mid abdomen surgical scar which  was the wound we have been following her for. Paradoxically she has developed a new open area just to the right of the midline almost a mirror imaging wound on the right. She states this came up about a week ago. She does not have a clear explanation. Both of these wounds are in surgical scar areas although from her I think this surgery was remote. She denies scratching this or traumatizing it. It is surrounded by 2 large areas that I suspect are herniations but I still cannot see how this would have come from trauma. 12/28/2017; the area on the left surgical scar in the lower abdomen is better however the area on the right that we identified last time is worse. Increase in dimensions. There is no obvious infection here. We have been using  silver alginate. She tells me she has been to the ER at the [place] and they gave her doxycycline she also saw her primary doctor yesterday. I think she needs to be referred to dermatology consideration of a skin biopsy question panniculitis 01/11/2018; we continue to have improvement in the left lower mid abdominal wound however the area on the right that we identified in late October has gotten quite a bit worse. There is not appear to be surrounding infection although the patient is complaining of a lot of pain. We have been using silver alginate. [facility] I think this should help 01/24/18 on evaluation today patient actually apparently has two abdominal wounds the one on the right is new here and unfortunately seems to be doing a little bit worse and that it's a little bit larger. Fortunately there does not appear to be any sign of infection at this time. No fevers, chills, nausea, or vomiting noted at this time. Overall again it sounds like this is a fairly complex issue and despite the fact that we been using silver nitrate in the [facility] Dressing still has some hyper granulation. No fevers, chills, nausea, or vomiting noted at this time. 01/31/18 plan evaluation today patient appears to be doing better in general in regard to her wounds. She has been tolerating the dressing changes without complication. I do feel like that right now she's making some progress so she still is having some discomfort. 02/07/18 on evaluation today patient actually appears to be doing rather well in regard to her double ulcers as far as I'm concerned. With that being said she is somewhat frustrated in general in regard to the overall appearance of everything. I feel like this is unwarranted however since she seems to be making progress. 02/15/2018; lower mid abdomen right much greater than left although the left was the original wound. The area on the  left looks really quite good and is smaller. The area on the right seems longer and measures worse in terms of dimensions. These areas are in the most dependent part of her large abdominal pannus and this may be the entire issue. at [facility] on the left there is lymphedema around the wound. The patient is seeing dermatology next week apparently in [facility] 03/01/2017; she likely had abdomen. Right now he other than left ankle both he has better today. I think both of these areas are smaller. Surface looks healthier she is due to see dermatology later this month. Using [facility] 1/23; abdominal wounds in the lower mid abdomen. This is in a very dependent area in her lower abdominal pannus and the simple tension in  this area may have a lot to do with this. She also has constant chronic pruritus and scratching and I wonder if this has something to do with open wounds in this area. She tells me her daughter is helping her with the dressings currently and the original wound on the left side of her mid abdomen is better almost closed the larger area on the right also looks some better. She sees a dermatologist next week I have told her to ask the dermatologist for help with generalized pruritus. The probable skin wounds that she has all over secondary to scratching. The wounds we have been treating may be related to simple tension in a dependent lymphedematous area. She could benefit from an abdominal binder to help support this area 2/13; abdominal wounds in the lower mid abdomen. Very dependent in her abdomen and this may be secondary to tension in the lower part of her abdominal pannus plus or minus edema. 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 She claims not to be scratching at the wound areas. 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 They give her gentian violet to put on the wound area but this is already present and Hydrofera Blue. The original wound on the left is healed today. However the area on the right part of her mid abdominal pannus appears larger. We are going to try to work with her on getting an abdominal binder to help support the weight of the pannus around the wounds 3/9; abdominal wound in the lower mid abdomen. She apparently has an abdominal binder that she bought on [employer]. She is using Hydrofera Blue to the wound on the right of the midline. The original wound was on the left and is closed over. 3/26; abdominal wound in the lower mid abdomen. This is her second wound in this area. The lower part of her abdominal pannus surrounding surgical scar. Once again she is not wearing the abdominal binder she said she obtained. The dressing is Hydrofera Blue 4/16; abdomen in the right lower mid abdomen. This is the second wound in this area the other was a mirror image area on the left. I had her seen by dermatology at the [facility] this was not very helpful. She is not wearing her abdominal binder. We have been using Hydrofera Blue which is what healed at the other side. The abdominal wound is actually larger She arrives in clinic today complaining of the plantar right heel. Very tender. She has been pushing herself up in her bed with her heels and this may be a deep tissue injury. 4/23; abdomen wound is about the same. Spreading along the folds of her pannus somewhat but generally the surface looks somewhat better. She is not using her abdominal binder although she did bring it in this time She arrives in clinic complaining of pain and itching in the right lateral rib area. She has 3 open areas here which are circular wounds. These are new She also mentions her lower sacrum and there are 2 wounds in this area. These are new 5/7; abdominal wound is  about the same although in general it looks better than a few weeks ago at which time it had punched out depth. No debridement is required. She will not wear her abdominal binder She has  areas on her right lateral rib cage 3 open areas circular wounds these look about the same as last time. I am assuming these have something to do with the prurigo nodularis that she may have scratched but I am not certain. Finally she has a linear area on her lower sacrum which I am assuming is a pressure area 5/21-Patient returns at 2 weeks for abdominal wound, areas on the right lateral rib cage, and lower sacrum linear area which is a pressure sore. We have been using silver alginate to all these wounds. She has been asked to wear abdominal binder which she is not. she has 2 new wounds on right flank area and on her coccyx. she has a total of 6 wounds 6/9; I have been following this patient for a prolonged period of time for wounds on her lower mid abdomen which I think were secondary to severe lymphedema. She had an area on the left of the midline that eventually healed only to open on the right side. This is still open we have been using silver alginate. She will not use an abdominal binder. About a month ago she arrived here with open areas on the right lateral ribs. This is in the middle of a large amount of soft tissue/pannus fold. She has 4 open areas here at this time. Finally she has 2 open areas in her lower coccyx. We have been using silver alginate on all the wound areas 6/25; we are following this woman for wounds on her lower mid abdomen in the setting of a pannus and severe dependent lymphedema. More recently she has had areas in the folds on the right lateral rib area. Culture I did of this area last time was negative. Finally she has an area on the coccyx. We have been using silver alginate to all the wound areas 7/9; this is a patient with morbid obesity severe lymphedema in her lower abdominal  pannus/severe dependent abdominal lymphedema. She first came to this clinic with an area on the left lower central abdomen. This eventually healed over. About the same time as it did heal over she developed an area in close juxtaposition on the other side of the midline. We have been following this for some months when she was here last time I thought things were improving although she a lot arrives today with a larger wound. She will not use her external abdominal binder. Over the last several weeks she has had areas on her right lateral rib area. The exact etiology of this is unclear we have been using silver alginate. She arrives today with the entire area inflamed angry with loss of surface epithelium and marked pruritus. I think this is tinea/Candida. This could be secondary or the primary in the etiology of these expanding areas. 7/23; the area on the right lateral rib areas is quite a bit better. Skin that we treated with oral and topical antifungals looks a lot better around this area. Unfortunately the lower abdominal area actually measures larger. There is no drainage here really no surrounding erythema. 8/6-Patient arrives to the clinic at 3 weeks, the right lateral rib cage area has copious drainage but the wounds look a little better, the abdominal midline wound unfortunately is larger, she is not able to use the binder both wounds have a clean base 8/20. Since the patient was last here she was hospitalized from 813 through 818. This was predominantly because of hyperkalemia secondary to lisinopril and dehydration. Lisinopril was discontinued at  discharge. She was also felt to have mild cellulitis around several skin ulcers she was given antibiotics in the hospital but not discharged on antibiotics. Also noted that she has stage IV chronic kidney disease with a baseline creatinine at 1.7. 9/3 the patient still has a 2 wound areas lower mid abdominal pannus and the right lateral ribs.  We have been using polymen to the abdominal wound and silver alginate to the area on the folds on her right lateral ribs. She is complaining about pruritus around the areas on the ribs. 9/17; 2-week follow-up. The area on the mid lower abdomen better using PolyMem. She has 4 small areas in the pannus fold on the right lateral ribs and is a new area on the left lateral lower abdominal folds this week. 10/1; 2-week follow-up. She has been using PolyMem on the mid lower abdomen and really not much improvement. She has this for small areas in the pannus fold of the right lateral ribs. These are gradually I think improving. The area that was new last time on the left lower abdominal pannus has closed over. 10/15; 2-week follow-up. I changed to silver collagen on the mid lower abdomen wound which seems smaller today. She is still using silver alginate on the right lateral ribs and a pannus fold and this looks better today although she is complaining of itching in this area She has had an outbreak on her face which almost looks like cystic acne. She is going to see her primary doctor about this tomorrow 11/5; silver collagen on the mid lower abdomen which seems to be doing a good job we are using silver alginate in the pannus folds on her right lateral ribs. She is complaining of pruritus in this area which is probably intertrigo/Candida. I will prescribe nystatin powder 12/3. We are using silver collagen to her mid lower abdomen which looks better. The pannus area on the right lateral ribs we are using silver alginate she is pointing nystatin powder in here. The wounds look better. She is separating the folds with hand towels. We have suggested Interdry and she is going to look into getting that online 12/28; 1 month follow-up. In general the area on her mid lower abdomen looks a lot better. Surface area is smaller we have been using silver collagen. The pannus area on the right lateral ribs we have  been using alginate. She also is putting Interdry in these areas. 1/25; 1 month follow-up. The area on her abdomen is about the same. We have been using silver collagen here. The areas on her right thorax and the pannus folds in this area are down to 3 small wounds we have been using silver alginate here Electronic Signature(s) Signed: 03/19/2019 5:32:11 PM By: Linton Ham MD Entered By: Linton Ham on 03/18/2019 11:42:21 -------------------------------------------------------------------------------- Physical Exam Details Patient Name: Date of Service: [name] [name]. 03/18/2019 10:00 AM Medical Record [id number] Patient Account Number: [id number] Date of Birth/Sex: Treating RN: Jul 01, 1955 (64 y.o. F) Primary Care Provider: SYSTEM, PCP Other Clinician: Referring Provider: Treating Provider/Extender:Tyger Wichman, Dulcy Fanny, Fuller Plan in Treatment: 89 Notes Wound exam; the midline lower abdomen. The wound looks about the same as last time. Has rims of epithelialization but I am not sure we have had much advancement. She does not tolerate even washing this wound with Anasept. She may be developing additional rims of epithelialization over the wound surface The areas on the right lateral rib area in the middle of pannus in this area are down  to 3 small wounds I think they are making quite a bit of progress Electronic Signature(s) Signed: 03/19/2019 5:32:11 PM By: Linton Ham MD Entered By: Linton Ham on 03/18/2019 11:43:18 -------------------------------------------------------------------------------- Physician Orders Details Patient Name: Date of Service: [name] [name]. 03/18/2019 10:00 AM Medical Record [id number] Patient Account Number: [id number] Date of Birth/Sex: Treating RN: 05-07-1955 (64 y.o. [name] Primary Care Provider: SYSTEM, PCP Other Clinician: Referring Provider: Treating Provider/Extender:Berkley Cronkright,  Dulcy Fanny, Fuller Plan in Treatment: 60 Verbal / Phone Orders: No Diagnosis Coding ICD-10 Coding Code Description Disruption of external operation (surgical) wound, not elsewhere classified, subsequent T81.31XD encounter Unspecified open wound of abdominal wall, left lower quadrant without penetration into peritoneal S31.104D cavity, subsequent encounter L98.498 Non-pressure chronic ulcer of skin of other sites with other specified severity I89.0 Lymphedema, not elsewhere classified B35.4 Tinea corporis Follow-up Appointments Return appointment in 1 month. Dressing Change Frequency Wound #3 Right Abdomen - Lower Quadrant Other: - change dressing twice a week. Wound #4 Right Flank Other: - change dressing twice a week. Skin Barriers/Peri-Wound Care Wound #3 Right Abdomen - Lower Quadrant Skin Prep Wound #4 Right Flank Skin Prep Wound Cleansing May shower and wash wound with soap and water. Primary Wound Dressing Wound #3 Right Abdomen - Lower Quadrant Collagen - moisten with hydrogel Wound #4 Right Flank Calcium Alginate Secondary Dressing Wound #3 Right Abdomen - Lower Quadrant Dry Gauze ABD pad - secure with tape Wound #4 Right Flank ABD pad Drawtex Off-Loading Turn and reposition every 2 hours Other: - abdominal binder to support abdomen. Patient to float right heel while in chair or bed with pillow. Patient to keep pressure off right heel as much as possible. Roll up a face cloth or a pillowcase to separate all skin folds- under breast, abdomen, both flank areas. Additional Orders / Instructions Other: - ***[facility] INTERDRY 10"x 12 feet roll or patient to purchase offline from [employer].*** [place] [facility] for wound care. - [facility] Electronic Signature(s) Signed: 03/18/2019 6:46:58 PM By: Levan Hurst RN, BSN Signed: 03/19/2019 5:32:11 PM By: Linton Ham MD Entered By: Levan Hurst on  03/18/2019 11:16:25 -------------------------------------------------------------------------------- Problem List Details Patient Name: Date of Service: [name] [name], [name] [name] 03/18/2019 10:00 AM Medical Record [id number] Patient Account Number: [id number] Date of Birth/Sex: Treating RN: [birth year] (64 y.o. 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 sites with 06/14/2018 No Yes other specified severity I89.0 Lymphedema, not elsewhere classified 08/30/2018 No Yes B35.4 Tinea corporis 08/30/2018 No Yes Inactive Problems ICD-10 Code Description Active Date Inactive Date M71.011 Abscess of bursa, right shoulder 06/29/2017 06/29/2017 L89.616 Pressure-induced deep tissue damage of right heel 06/07/2018 06/07/2018 L89.150 Pressure ulcer of sacral region, unstageable 06/14/2018 06/14/2018 Resolved Problems Electronic Signature(s) Signed: 03/19/2019 5:32:11 PM By: Linton Ham MD Entered By: Linton Ham on 03/18/2019 11:41:31 -------------------------------------------------------------------------------- Progress Note Details Patient Name: Date of Service: [name] [name]. 03/18/2019 10:00 AM Medical Record [id number] Patient Account Number: [id number] Date of Birth/Sex: Treating RN: 1955/06/13 (64 y.o. F) Primary Care Provider: SYSTEM, PCP Other Clinician: Referring Provider: Treating Provider/Extender:Sinaya Minogue, Dulcy Fanny,  Dwight Weeks in Treatment: 89 Subjective History of Present Illness (HPI) 06/29/17; this is a 64 year old woman who is a poor historian. It appears that she's had limited medical care over the last 2 years or so and what she had his through the [place]. She is here for review of a draining area on her right shoulder anteriorly as well as a nonhealing surgical wound in the left lower abdomen. Per her description she had a hysterectomy in 2006 4 "tumors". About 9 months later she had have another operation in the same area because of "tumors" involving her fallopian tubes and ovary. Sounds as though the surgical wound in this area that took a long time to heal. Apparently the area on her left lower abdomen is been open for the last 6 or 7 months. She is been applying topical antibiotics. 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 She had a fall in late 2018 and the open area on top of her shoulder. She is had draining pus coming out of this wound since she's been applying topical antibiotics and a Band-Aid. She is not complaining of a lot of pain or fever although she did have chills today. She saw her doctor at the [place] yesterday she doesn't know her name she apparently was referred to a surgeon at the [facility] and [place] presumably an orthopedic surgeon to look at that shoulder. She also has a primary doctor in [place] by the name of Dr. Jimmye Norman who referred her here after seeing her within the last month. 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 We don't have a lot of information in her in Epic. Last hospitalization was from 11/21/16 through 11/24/16 with hypoglycemia felt to be secondary to sulfonylureas. There is no mention of a wound at that time. 07/07/17; patient's shoulder cultured MSSA. I put her on clindamycin to had some anaerobic coverage with  caution about diarrhea. I had asked her to make an orthopedic follow-up at the [place] with a doctor that made the original surgery but she has not done that yet. ooShe also has a fairly large wound in the left lower abdomen in the middle of his surgical scar. This was apparently gynecologic surgery perhaps fibroid surgery. In any case these wounds are larger today. 07/21/17; the patient went to see the orthopedic doctor at the [facility] who did her original surgery surgery. Per the patient's description the draining area had closed down. He didn't think anything needed to be done there were no imaging studies. ooThe lower abdominal wound in the left mid pelvis area also looks somewhat better to me. There is less surface material on this still some probing areas but not a lot of depth. We've been using silver alginate 08/03/17; the patient arrives back in 2 week follow-up. The draining sinus on her right shoulder has not reopened although she is complaining of widespread shoulder and other joint pain. She is using silver alginate to the remaining surgical wound on her abdomen. 08/18/17; the patient has 2 week follow-up appointment. The draining sinus on her right shoulder is not reopened. She is complaining of left shoulder pain today. The presumed surgical wound on her lower abdomen looks quite a bit better and this is really contracted quite nicely. When she first came in the deep sinus she had on the anterior shoulder cultured MSSA. I gave her clindamycin. I arranged for orthopedic  follow-up at the [facility] however this is already closed nevertheless this was a deep probing tunnel and I think it needs to be watched for reopening. 09/07/17; surgical wound on her lower abdomen. This as 2 attached leaflets. The larger one is superiorly. A small probing area here with some sanguinous drainage. Surface needed debridement today. We've been using silver alginate however I changed this to silver collagen today 09/15/17.  Surgical wound on her lower abdomen. No recent improvement although this is markedly improved from when we first started seeing her although the original problem was a wound on her right shoulder which is long since closed.switched her to Silver collagen last week She arrives in clinic today complaining of abdominal pain "so bad I almost went to the ER". She tells me she had an endoscopy and colonoscopy about a week ago. This was at the [place], I have no way to look at this. She is unaware of any biopsy was done 10/02/17 on evaluation today patient continues to do well in regard to her abdominal wound were using Prisma it's obviously taken some time for this to heal but nonetheless I do think that she is making good progress currently. There does not appear to be any evidence of infection at this time which is good news. She still does have some tunneling or undermining but again I think this is still doing well. 10/19/17; she continues to make good progress in this surgical abdominal wound using Prisma. This is come in nicely at one point had considerable depth. No evidence of infection. The patient is not complaining of pain in this area but is complaining of diabetic neuropathy pain in her right foot 11/02/17;the patient's wound is superficial and is really continued to make decent progress. I thought this would be more problematic when I first saw however this appears to a filled in nicely there is no depth. It exists in the crevice between what I think is lower abdominal hernias. She comes in complaining of generalized pain however she is running out of her narcotics providedto her by a local pain clinic. She is asking for home health however she is [place] as I understand we cannot order this. 11/16/2017; 2-week follow-up. She has a lower abdominal surgical wound and this appears to be making nice progress closing in nicely each time we see it. This was initially a deep crevice surgical area between what  I think is to lower abdominal hernias. In spite of this wound has healthy granulation. There does not appear to be any depth at all. There is probably a lot of tensile stress on the wound area however in spite of this things actually look better 11/30/2017; 2-week follow-up. She is complaining of left lower quadrant abdominal pain it is difficult to characterize this clearly episodic and not really associated with nausea or vomiting. She follows at the [place]. Tells me she was recently seen there by her new primary physician. The wound is in the lower abdomen in the middle of what I think was a gynecologic surgical site. We have made progress with this. 12/14/2017; 2-week follow-up. The patient comes in today with an improved wound area on the lower mid abdomen surgical scar which was the wound we have been following her for. Paradoxically she has developed a new open area just to the right of the midline almost a mirror imaging wound on the right. She states this came up about a week ago. She does not have a clear explanation. Both of  these wounds are in surgical scar areas although from her I think this surgery was remote. She denies scratching this or traumatizing it. It is surrounded by 2 large areas that I suspect are herniations but I still cannot see how this would have come from trauma. 12/28/2017; the area on the left surgical scar in the lower abdomen is better however the area on the right that we identified last time is worse. Increase in dimensions. There is no obvious infection here. We have been using silver alginate. She tells me she has been to the ER at the [place] and they gave her doxycycline she also saw her primary doctor yesterday. I think she needs to be referred to dermatology consideration of a skin biopsy question panniculitis 01/11/2018; we continue to have improvement in the left lower mid abdominal wound however the area on the right that we identified in late October has  gotten quite a bit worse. There is not appear to be surrounding infection although the patient is complaining of a lot of pain. We have been using silver alginate. [facility] I think this should help 01/24/18 on evaluation today patient actually apparently has two abdominal wounds the one on the right is new here and unfortunately seems to be doing a little bit worse and that it's a little bit larger. Fortunately there does not appear to be any sign of infection at this time. No fevers, chills, nausea, or vomiting noted at this time. Overall again it sounds like this is a fairly complex issue and despite the fact that we been using silver nitrate in the [facility] Dressing still has some hyper granulation. No fevers, chills, nausea, or vomiting noted at this time. 01/31/18 plan evaluation today patient appears to be doing better in general in regard to her wounds. She has been tolerating the dressing changes without complication. I do feel like that right now she's making some progress so she still is having some discomfort. 02/07/18 on evaluation today patient actually appears to be doing rather well in regard to her double ulcers as far as I'm concerned. With that being said she is somewhat frustrated in general in regard to the overall appearance of everything. I feel like this is unwarranted however since she seems to be making progress. 02/15/2018; lower mid abdomen right much greater than left although the left was the original wound. The area on the left looks really quite good and is smaller. The area on the right seems longer and measures worse in terms of dimensions. These areas are in the most dependent part of her large abdominal pannus and this may be the entire issue. at [facility] on the left there is lymphedema around the wound. The patient is seeing dermatology next week apparently in [facility] 03/01/2017; she likely had  abdomen. Right now he other than left ankle both he has better today. I think both of these areas are smaller. Surface looks healthier she is due to see dermatology later this month. Using [facility] 1/23; abdominal wounds in the lower mid abdomen. This is in a very dependent area in her lower abdominal pannus and the simple tension in this area may have a lot to do with this. She also has constant chronic pruritus and scratching and I wonder if this has something to do with open wounds in this area. She tells me her daughter is helping her with the dressings currently and the original wound on  the left side of her mid abdomen is better almost closed the larger area on the right also looks some better. She sees a dermatologist next week I have told her to ask the dermatologist for help with generalized pruritus. The probable skin wounds that she has all over secondary to scratching. The wounds we have been treating may be related to simple tension in a dependent lymphedematous area. She could benefit from an abdominal binder to help support this area 2/13; abdominal wounds in the lower mid abdomen. Very dependent in her abdomen and this may be secondary to tension in the lower part of her abdominal pannus plus or minus edema. 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 She claims not to be scratching at the wound areas. 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 They give her gentian violet to put on the wound area but this is already present and Hydrofera Blue. The original wound on the left is healed today. However the area on the right part of her mid abdominal pannus appears larger. We are going to try to work with her on getting an abdominal binder to help support the weight  of the pannus around the wounds 3/9; abdominal wound in the lower mid abdomen. She apparently has an abdominal binder that she bought on [employer]. She is using Hydrofera Blue to the wound on the right of the midline. The original wound was on the left and is closed over. 3/26; abdominal wound in the lower mid abdomen. This is her second wound in this area. The lower part of her abdominal pannus surrounding surgical scar. Once again she is not wearing the abdominal binder she said she obtained. The dressing is Hydrofera Blue 4/16; abdomen in the right lower mid abdomen. This is the second wound in this area the other was a mirror image area on the left. I had her seen by dermatology at the [facility] this was not very helpful. She is not wearing her abdominal binder. We have been using Hydrofera Blue which is what healed at the other side. The abdominal wound is actually larger She arrives in clinic today complaining of the plantar right heel. Very tender. She has been pushing herself up in her bed with her heels and this may be a deep tissue injury. 4/23; abdomen wound is about the same. Spreading along the folds of her pannus somewhat but generally the surface looks somewhat better. She is not using her abdominal binder although she did bring it in this time ooShe arrives in clinic complaining of pain and itching in the right lateral rib area. She has 3 open areas here which are circular wounds. These are new ooShe also mentions her lower sacrum and there are 2 wounds in this area. These are new 5/7; abdominal wound is about the same although in general it looks better than a few weeks ago at which time it had punched out depth. No debridement is required. She will not wear her abdominal binder ooShe has areas on her right lateral rib cage 3 open areas circular wounds these look about the same as last time. I am assuming these have something to do with the prurigo nodularis that she may have scratched  but I am not certain. Finally she has a linear area on her  lower sacrum which I am assuming is a pressure area 5/21-Patient returns at 2 weeks for abdominal wound, areas on the right lateral rib cage, and lower sacrum linear area which is a pressure sore. We have been using silver alginate to all these wounds. She has been asked to wear abdominal binder which she is not. she has 2 new wounds on right flank area and on her coccyx. she has a total of 6 wounds 6/9; I have been following this patient for a prolonged period of time for wounds on her lower mid abdomen which I think were secondary to severe lymphedema. She had an area on the left of the midline that eventually healed only to open on the right side. This is still open we have been using silver alginate. She will not use an abdominal binder. About a month ago she arrived here with open areas on the right lateral ribs. This is in the middle of a large amount of soft tissue/pannus fold. She has 4 open areas here at this time. Finally she has 2 open areas in her lower coccyx. We have been using silver alginate on all the wound areas 6/25; we are following this woman for wounds on her lower mid abdomen in the setting of a pannus and severe dependent lymphedema. More recently she has had areas in the folds on the right lateral rib area. Culture I did of this area last time was negative. Finally she has an area on the coccyx. We have been using silver alginate to all the wound areas 7/9; this is a patient with morbid obesity severe lymphedema in her lower abdominal pannus/severe dependent abdominal lymphedema. She first came to this clinic with an area on the left lower central abdomen. This eventually healed over. About the same time as it did heal over she developed an area in close juxtaposition on the other side of the midline. We have been following this for some months when she was here last time I thought things were improving although  she a lot arrives today with a larger wound. She will not use her external abdominal binder. Over the last several weeks she has had areas on her right lateral rib area. The exact etiology of this is unclear we have been using silver alginate. She arrives today with the entire area inflamed angry with loss of surface epithelium and marked pruritus. I think this is tinea/Candida. This could be secondary or the primary in the etiology of these expanding areas. 7/23; the area on the right lateral rib areas is quite a bit better. Skin that we treated with oral and topical antifungals looks a lot better around this area. Unfortunately the lower abdominal area actually measures larger. There is no drainage here really no surrounding erythema. 8/6-Patient arrives to the clinic at 3 weeks, the right lateral rib cage area has copious drainage but the wounds look a little better, the abdominal midline wound unfortunately is larger, she is not able to use the binder both wounds have a clean base 8/20. Since the patient was last here she was hospitalized from 813 through 818. This was predominantly because of hyperkalemia secondary to lisinopril and dehydration. Lisinopril was discontinued at discharge. She was also felt to have mild cellulitis around several skin ulcers she was given antibiotics in the hospital but not discharged on antibiotics. Also noted that she has stage IV chronic kidney disease with a baseline creatinine at 1.7. 9/3 the patient still has a 2 wound areas  lower mid abdominal pannus and the right lateral ribs. We have been using polymen to the abdominal wound and silver alginate to the area on the folds on her right lateral ribs. She is complaining about pruritus around the areas on the ribs. 9/17; 2-week follow-up. The area on the mid lower abdomen better using PolyMem. She has 4 small areas in the pannus fold on the right lateral ribs and is a new area on the left lateral lower  abdominal folds this week. 10/1; 2-week follow-up. She has been using PolyMem on the mid lower abdomen and really not much improvement. She has this for small areas in the pannus fold of the right lateral ribs. These are gradually I think improving. The area that was new last time on the left lower abdominal pannus has closed over. 10/15; 2-week follow-up. I changed to silver collagen on the mid lower abdomen wound which seems smaller today. She is still using silver alginate on the right lateral ribs and a pannus fold and this looks better today although she is complaining of itching in this area She has had an outbreak on her face which almost looks like cystic acne. She is going to see her primary doctor about this tomorrow 11/5; silver collagen on the mid lower abdomen which seems to be doing a good job we are using silver alginate in the pannus folds on her right lateral ribs. She is complaining of pruritus in this area which is probably intertrigo/Candida. I will prescribe nystatin powder 12/3. We are using silver collagen to her mid lower abdomen which looks better. The pannus area on the right lateral ribs we are using silver alginate she is pointing nystatin powder in here. The wounds look better. She is separating the folds with hand towels. We have suggested Interdry and she is going to look into getting that online 12/28; 1 month follow-up. In general the area on her mid lower abdomen looks a lot better. Surface area is smaller we have been using silver collagen. The pannus area on the right lateral ribs we have been using alginate. She also is putting Interdry in these areas. 1/25; 1 month follow-up. The area on her abdomen is about the same. We have been using silver collagen here. The areas on her right thorax and the pannus folds in this area are down to 3 small wounds we have been using silver alginate here Objective Constitutional Vitals Time Taken: 10:12 AM, Height: 62 in,  Weight: 260 lbs, BMI: 47.5, Temperature: 97.6 F, Pulse: 63 bpm, Respiratory Rate: 20 breaths/min, Blood Pressure: 108/52 mmHg, Capillary Blood Glucose: 157 mg/dl. General Notes: patient stated CBG this morning was 157 Integumentary (Hair, Skin) Wound #3 status is Open. Original cause of wound was Gradually Appeared. The wound is located on the Right Abdomen - Lower Quadrant. The wound measures 7.2cm length x 2.1cm width x 0.1cm depth; 11.875cm^2 area and 1.188cm^3 volume. There is Fat Layer (Subcutaneous Tissue) Exposed exposed. There is no tunneling or undermining noted. There is a medium amount of serosanguineous drainage noted. The wound margin is distinct with the outline attached to the wound base. There is large (67-100%) red, pink granulation within the wound bed. There is no necrotic tissue within the wound bed. Wound #4 status is Open. Original cause of wound was Gradually Appeared. The wound is located on the Right Flank. The wound measures 0.5cm length x 9.2cm width x 0.1cm depth; 3.613cm^2 area and 0.361cm^3 volume. There is Fat Layer (Subcutaneous Tissue) Exposed exposed.  There is no tunneling or undermining noted. There is a medium amount of serosanguineous drainage noted. The wound margin is distinct with the outline attached to the wound base. There is large (67-100%) pink granulation within the wound bed. There is no necrotic tissue within the wound bed. Assessment Active Problems ICD-10 Disruption of external operation (surgical) wound, not elsewhere classified, subsequent encounter Unspecified open wound of abdominal wall, left lower quadrant without penetration into peritoneal cavity, subsequent encounter Non-pressure chronic ulcer of skin of other sites with other specified severity Lymphedema, not elsewhere classified Tinea corporis Plan Follow-up Appointments: Return appointment in 1 month. Dressing Change Frequency: Wound #3 Right Abdomen - Lower  Quadrant: Other: - change dressing twice a week. Wound #4 Right Flank: Other: - change dressing twice a week. Skin Barriers/Peri-Wound Care: Wound #3 Right Abdomen - Lower Quadrant: Skin Prep Wound #4 Right Flank: Skin Prep Wound Cleansing: May shower and wash wound with soap and water. Primary Wound Dressing: Wound #3 Right Abdomen - Lower Quadrant: Collagen - moisten with hydrogel Wound #4 Right Flank: Calcium Alginate Secondary Dressing: Wound #3 Right Abdomen - Lower Quadrant: Dry Gauze ABD pad - secure with tape Wound #4 Right Flank: ABD pad Drawtex Off-Loading: Turn and reposition every 2 hours Other: - abdominal binder to support abdomen. Patient to float right heel while in chair or bed with pillow. Patient to keep pressure off right heel as much as possible. Roll up a face cloth or a pillowcase to separate all skin folds- under breast, abdomen, both flank areas. Additional Orders / Instructions: Other: - ***[facility] INTERDRY 10"x 12 feet roll or patient to purchase offline from [employer].*** Home Health: [place] [facility] for wound care. - [facility]. I have not change the dressing on the lower abdomen still silver collagen. I will make a judgment about this next time perhaps Hydrofera Blue if this is not made any improvements 2. I am continue with silver alginate to the right thorax area which I think is doing quite well. 3 wounds in this area all are smaller. Electronic Signature(s) Signed: 03/19/2019 5:32:11 PM By: Linton Ham MD Entered By: Linton Ham on 03/18/2019 11:45:08 -------------------------------------------------------------------------------- SuperBill Details Patient Name: Date of Service: [name] [name] 03/18/2019 Medical Record Number:[id number] Patient Account Number: [id number] Date of Birth/Sex: Treating RN: 1956-02-14 (64 y.o. F) Primary Care Provider: SYSTEM, PCP Other  Clinician: Referring Provider: Treating Provider/Extender:Dyna Figuereo, Dulcy Fanny, Fuller Plan in Treatment: 89 Diagnosis Coding ICD-10 Codes Code Description Disruption of external operation (surgical) wound, not elsewhere classified, subsequent T81.31XD encounter Unspecified open wound of abdominal wall, left lower quadrant without penetration into peritoneal S31.104D cavity, subsequent encounter L98.498 Non-pressure chronic ulcer of skin of other sites with other specified severity I89.0 Lymphedema, not elsewhere classified B35.4 Tinea corporis Facility Procedures The patient participates with Medicare or their insurance follows the Medicare Facility Guidelines: CPT4 Code Description Modifier Quantity [id number] Coleman VISIT-LEV 4 EST PT 1 Physician Procedures CPT4 Description: Code [id number] - WC PHYS LEVEL 3 - EST PT ICD-10 Diagnosis Description S31.104D Unspecified open wound of abdominal wall, left lower quadrant into peritoneal cavity, subsequent encounter L98.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

## 2019-03-21 NOTE — Progress Notes (Signed)
[name], [name] ([id number]) Visit Report for 03/18/2019 Arrival Information Details Patient Name: Date of Service: [name] [name], [name] [name] 03/18/2019 10:00 AM Medical Record Number:[id number] Patient Account Number: [id number] Date of Birth/Sex: Treating RN: 28-Aug-1955 (64 y.o. [name] Primary Care Norissa Bartee: SYSTEM, PCP Other Clinician: Referring Romell Wolden: Treating Litha Lamartina/Extender:Robson, Dulcy Fanny, Fuller Plan in Treatment: 89 Visit Information History Since Last Visit Added or deleted any medications: No Patient Arrived: Wheel Chair Any new allergies or adverse reactions: No Arrival Time: 10:11 Had a fall or experienced change in No Accompanied By: family member activities of daily living that may affect Transfer Assistance: EasyPivot risk of falls: Patient Lift Signs or symptoms of abuse/neglect since last No Patient Identification Verified: Yes visito Secondary Verification Process Yes Hospitalized since last visit: No Completed: Implantable device outside of the clinic excluding No Patient Requires Transmission-Based No cellular tissue based products placed in the center Precautions: since last visit: Patient Has Alerts: No Has Dressing in Place as Prescribed: Yes Pain Present Now: No Electronic Signature(s) Signed: 03/21/2019 7:44:20 AM By: Kela Millin Entered By: Kela Millin on 03/18/2019 10:11:48 -------------------------------------------------------------------------------- Clinic Level of Care Assessment Details Patient Name: Date of Service: [name] [name], [name] [name] 03/18/2019 10:00 AM Medical Record Number:[id number] Patient Account Number: [id number] Date of Birth/Sex: Treating RN: 1955/05/20 (64 y.o. [name] [name] Primary Care Nonna Renninger: SYSTEM, PCP Other Clinician: Referring Arcenia Scarbro: Treating Loraina Stauffer/Extender:Robson, Dulcy Fanny, Fuller Plan in Treatment: 89 Clinic Level of Care Assessment Items TOOL 4  Quantity Score X - Use when only an EandM is performed on FOLLOW-UP visit 1 0 ASSESSMENTS - Nursing Assessment / Reassessment X - Reassessment of Co-morbidities (includes updates in patient status) 1 10 X - Reassessment of Adherence to Treatment Plan 1 5 ASSESSMENTS - Wound and Skin Assessment / Reassessment []  - Simple Wound Assessment / Reassessment - one wound 0 X - Complex Wound Assessment / Reassessment - multiple wounds 2 5 []  - Dermatologic / Skin Assessment (not related to wound area) 0 ASSESSMENTS - Focused Assessment []  - Circumferential Edema Measurements - multi extremities 0 []  - Nutritional Assessment / Counseling / Intervention 0 []  - Lower Extremity Assessment (monofilament, tuning fork, pulses) 0 []  - Peripheral Arterial Disease Assessment (using hand held doppler) 0 ASSESSMENTS - Ostomy and/or Continence Assessment and Care []  - Incontinence Assessment and Management 0 []  - Ostomy Care Assessment and Management (repouching, etc.) 0 PROCESS - Coordination of Care X - Simple Patient / Family Education for ongoing care 1 15 []  - Complex (extensive) Patient / Family Education for ongoing care 0 X - Staff obtains Programmer, systems, Records, Test Results / Process Orders 1 10 X - Staff telephones HHA, Nursing Homes / Clarify orders / etc 1 10 []  - Routine Transfer to another Facility (non-emergent condition) 0 []  - Routine Hospital Admission (non-emergent condition) 0 []  - New Admissions / Biomedical engineer / Ordering NPWT, Apligraf, etc. 0 []  - Emergency Hospital Admission (emergent condition) 0 X - Simple Discharge Coordination 1 10 []  - Complex (extensive) Discharge Coordination 0 PROCESS - Special Needs []  - Pediatric / Minor Patient Management 0 []  - Isolation Patient Management 0 []  - Hearing / Language / Visual special needs 0 []  - Assessment of Community assistance (transportation, D/C planning, etc.) 0 []  - Additional assistance / Altered mentation 0 []  - Support  Surface(s) Assessment (bed, cushion, seat, etc.) 0 INTERVENTIONS - Wound Cleansing / Measurement []  - Simple Wound Cleansing - one wound 0 X - Complex Wound Cleansing - multiple wounds 2 5 X -  Wound Imaging (photographs - any number of wounds) 1 5 []  - Wound Tracing (instead of photographs) 0 []  - Simple Wound Measurement - one wound 0 X - Complex Wound Measurement - multiple wounds 2 5 INTERVENTIONS - Wound Dressings []  - Small Wound Dressing one or multiple wounds 0 X - Medium Wound Dressing one or multiple wounds 2 15 []  - Large Wound Dressing one or multiple wounds 0 X - Application of Medications - topical 1 5 []  - Application of Medications - injection 0 INTERVENTIONS - Miscellaneous []  - External ear exam 0 []  - Specimen Collection (cultures, biopsies, blood, body fluids, etc.) 0 []  - Specimen(s) / Culture(s) sent or taken to Lab for analysis 0 []  - Patient Transfer (multiple staff / Civil Service fast streamer / Similar devices) 0 []  - Simple Staple / Suture removal (25 or less) 0 []  - Complex Staple / Suture removal (26 or more) 0 []  - Hypo / Hyperglycemic Management (close monitor of Blood Glucose) 0 []  - Ankle / Brachial Index (ABI) - do not check if billed separately 0 X - Vital Signs 1 5 Has the patient been seen at the hospital within the last three years: Yes Total Score: 135 Level Of Care: New/Established - Level 4 Electronic Signature(s) Signed: 03/18/2019 6:46:58 PM By: Levan Hurst RN, BSN Entered By: Levan Hurst on 03/18/2019 18:06:40 -------------------------------------------------------------------------------- Encounter Discharge Information Details Patient Name: Date of Service: [name] [name]. 03/18/2019 10:00 AM Medical Record [id number] Patient Account Number: [id number] Date of Birth/Sex: Treating RN: [birth year] (64 y.o. [name] [name] Primary Care Sotero Brinkmeyer: SYSTEM, PCP Other Clinician: Referring Shonette Rhames: Treating Yaresly Menzel/Extender:Robson,  Dulcy Fanny, Fuller Plan in Treatment: 89 Encounter Discharge Information Items Discharge Condition: Stable Ambulatory Status: Wheelchair Discharge Destination: Home Transportation: Private Auto Accompanied By: self Schedule Follow-up Appointment: Yes Clinical Summary of Care: Electronic Signature(s) Signed: 03/18/2019 5:49:19 PM By: Deon Pilling Entered By: Deon Pilling on 03/18/2019 11:49:47 -------------------------------------------------------------------------------- Lower Extremity Assessment Details Patient Name: Date of Service: [name] [name], [name] [name] 03/18/2019 10:00 AM Medical Record Number:[id number] Patient Account Number: [id number] Date of Birth/Sex: Treating RN: 1955/10/25 (64 y.o. [name] Primary Care Labarron Durnin: SYSTEM, PCP Other Clinician: Referring Analia Zuk: Treating Anita Mcadory/Extender:Robson, Dulcy Fanny, Fuller Plan in Treatment: 109 Electronic Signature(s) Signed: 03/21/2019 7:44:20 AM By: Kela Millin Entered By: Kela Millin on 03/18/2019 10:12:57 -------------------------------------------------------------------------------- Multi Wound Chart Details Patient Name: Date of Service: [name] [name]. 03/18/2019 10:00 AM Medical Record Number:[id number] Patient Account Number: [id number] Date of Birth/Sex: Treating RN: [birth year] (64 y.o. F) Primary Care Jerol Rufener: SYSTEM, PCP Other Clinician: Referring Keneth Borg: Treating Rojelio Uhrich/Extender:Robson, Dulcy Fanny, Fuller Plan in Treatment: 89 Vital Signs Height(in): 62 Capillary Blood 157 Glucose(mg/dl): Weight(lbs): 260 Pulse(bpm): 58 Body Mass Index(BMI): 80 Blood Pressure(mmHg): 108/52 Temperature(F): 97.6 Respiratory 20 Rate(breaths/min): Photos: [3:No Photos] [4:No Photos] [N/A:N/A] Wound Location: [3:Right Abdomen - Lower Quadrant] [4:Right Flank] [N/A:N/A] Wounding Event: [3:Gradually Appeared] [4:Gradually Appeared] [N/A:N/A] Primary Etiology:  [3:Dehisced Wound] [4:MASD Moisture Associated Skin Damage] [N/A:N/A] Comorbid History: [3:Cataracts, Lymphedema, Cataracts, Lymphedema, N/A Sleep Apnea, Hypertension, Sleep Apnea, Hypertension, Type II Diabetes, End Stage Type II Diabetes, End Stage Renal Disease, Gout, Rheumatoid Arthritis, Neuropathy] [4:Renal Disease,  Gout, Rheumatoid Arthritis, Neuropathy] Date Acquired: [3:12/07/2017] [4:05/31/2018] [N/A:N/A] Weeks of Treatment: [3:65] [4:39] [N/A:N/A] Wound Status: [3:Open] [4:Open] [N/A:N/A] Clustered Wound: [3:No] [4:Yes] [N/A:N/A] Clustered Quantity: [3:N/A] [4:3] [N/A:N/A] Measurements L x W x D 7.2x2.1x0.1 [4:0.5x9.2x0.1] [N/A:N/A] (cm) Area (cm) : [3:11.875] [4:3.613] [N/A:N/A] Volume (cm) : [3:1.188] [4:0.361] [N/A:N/A] % Reduction in Area: [3:-83.30%] [4:55.60%] [N/A:N/A] % Reduction in Volume: 79.60% [  4:55.60%] [N/A:N/A] Classification: [3:Full Thickness Without Exposed Support Structures] [4:N/A] [N/A:N/A] Exudate Amount: [3:Medium] [4:Medium] [N/A:N/A] Exudate Type: [3:Serosanguineous] [4:Serosanguineous] [N/A:N/A] Exudate Color: [3:red, brown] [4:red, brown] [N/A:N/A] Wound Margin: [3:Distinct, outline attached Distinct, outline attached N/A] Granulation Amount: [3:Large (67-100%)] [4:Large (67-100%)] [N/A:N/A] Granulation Quality: [3:Red, Pink] [4:Pink] [N/A:N/A] Necrotic Amount: [3:None Present (0%)] [4:None Present (0%)] [N/A:N/A] Exposed Structures: [3:Fat Layer (Subcutaneous Fat Layer (Subcutaneous N/A Tissue) Exposed: Yes Fascia: No Tendon: No Muscle: No Joint: No Bone: No Small (1-33%)] [4:Tissue) Exposed: Yes Fascia: No Tendon: No Muscle: No Joint: No Bone: No Small (1-33%)] [N/A:N/A] Treatment Notes Electronic Signature(s) Signed: 03/19/2019 5:32:11 PM By: Linton Ham MD Entered By: Linton Ham on 03/18/2019 11:41:40 -------------------------------------------------------------------------------- Multi-Disciplinary Care Plan Details Patient  Name: Date of Service: [name] [name]. 03/18/2019 10:00 AM Medical Record [id number] Patient Account Number: [id number] Date of Birth/Sex: Treating RN: 10-Nov-1955 (64 y.o. [name] [name] Primary Care Mancel Lardizabal: SYSTEM, PCP Other Clinician: Referring Niyati Heinke: Treating Noland Pizano/Extender:Robson, Dulcy Fanny, Fuller Plan in Treatment: 89 Active Inactive Wound/Skin Impairment Nursing Diagnoses: Impaired tissue integrity Goals: Patient/caregiver will verbalize understanding of skin care regimen Date Initiated: 09/07/2017 Target Resolution Date: 04/19/2019 Goal Status: Active Ulcer/skin breakdown will have a volume reduction of 30% by week 4 Date Initiated: 06/29/2017 Date Inactivated: 08/04/2017 Target Resolution Date: 09/01/2017 Goal Status: Unmet Unmet Reason: larger Interventions: Assess patient/caregiver ability to obtain necessary supplies Assess patient/caregiver ability to perform ulcer/skin care regimen upon admission and as needed Assess ulceration(s) every visit Provide education on ulcer and skin care Notes: Electronic Signature(s) Signed: 03/18/2019 6:46:58 PM By: Levan Hurst RN, BSN Entered By: Levan Hurst on 03/18/2019 18:06:01 -------------------------------------------------------------------------------- Pain Assessment Details Patient Name: Date of Service: [name] [name]. 03/18/2019 10:00 AM Medical Record [id number] Patient Account Number: [id number] Date of Birth/Sex: Treating RN: 02-Aug-1955 (64 y.o. [name] Primary Care Jaquay Posthumus: SYSTEM, PCP Other Clinician: Referring Sandee Bernath: Treating Lilleigh Hechavarria/Extender:Robson, Dulcy Fanny, Fuller Plan in Treatment: 89 Active Problems Location of Pain Severity and Description of Pain Patient Has Paino No Site Locations Pain Management and Medication Current Pain Management: Electronic Signature(s) Signed: 03/21/2019 7:44:20 AM By: Kela Millin Entered By:  Kela Millin on 03/18/2019 10:12:48 -------------------------------------------------------------------------------- Patient/Caregiver Education Details Christain [place] 1/25/2021andnbsp10:00 Patient Name: Date of Service: M. AM Medical Record Patient Account Number: [id number] Number: Treating RN: Levan Hurst Date of Birth/Gender: 1955/09/06 (63 y.o. F) Other Clinician: Primary Care Physician: SYSTEM, PCP Treating Linton Ham Referring Physician: Physician/Extender: Dionisio Paschal in Treatment: 41 Education Assessment Education Provided To: Patient Education Topics Provided Wound/Skin Impairment: Methods: Explain/Verbal Responses: State content correctly Electronic Signature(s) Signed: 03/18/2019 6:46:58 PM By: Levan Hurst RN, BSN Entered By: Levan Hurst on 03/18/2019 18:06:10 -------------------------------------------------------------------------------- Wound Assessment Details Patient Name: Date of Service: [name] [name]. 03/18/2019 10:00 AM Medical Record [id number] Patient Account Number: [id number] Date of Birth/Sex: Treating RN: 09-06-1955 (64 y.o. [name] Primary Care Yahira Timberman: SYSTEM, PCP Other Clinician: Referring Nyx Keady: Treating Deyci Gesell/Extender:Robson, Dulcy Fanny, Fuller Plan in Treatment: 89 Wound Status Wound Number: 3 Primary Dehisced Wound Etiology: Wound Location: Right Abdomen - Lower Quadrant Wound Open Wounding Event: Gradually Appeared Status: Date Acquired: 12/07/2017 Comorbid Cataracts, Lymphedema, Sleep Apnea, Weeks Of Treatment: 65 History: Hypertension, Type II Diabetes, End Stage Clustered Wound: No Renal Disease, Gout, Rheumatoid Arthritis, Neuropathy Photos Wound Measurements Length: (cm) 7.2 % Reduct Width: (cm) 2.1 % Reduct Depth: (cm) 0.1 Epitheli Area: (cm) 11.875 Tunneli Volume: (cm) 1.188 Undermi Wound Description Classification: Full Thickness  Without Exposed Support Foul Odo Structures Slough/F Wound Distinct, outline attached  Margin: Exudate Medium Amount: Exudate Serosanguineous Type: Exudate red, brown Color: Wound Bed Granulation Amount: Large (67-100%) Granulation Quality: Red, Pink Fascia Expos Necrotic Amount: None Present (0%) Fat Layer (S Tendon Expos Muscle Expos Joint Expose Bone Exposed r After Cleansing: No ibrino No Exposed Structure ed: No ubcutaneous Tissue) Exposed: Yes ed: No ed: No d: No : No ion in Area: -83.3% ion in Volume: 79.6% alization: Small (1-33%) ng: No ning: No Treatment Notes Wound #3 (Right Abdomen - Lower Quadrant) 1. Cleanse With Wound Cleanser 2. Periwound Care Skin Prep 3. Primary Dressing Applied Collagen Hydrogel or K-Y Jelly 4. Secondary Dressing ABD Pad 5. Secured With [facility]) Signed: 03/18/2019 4:40:42 PM By: Mikeal Hawthorne EMT/HBOT Signed: 03/21/2019 7:44:20 AM By: Kela Millin Entered By: Mikeal Hawthorne on 03/18/2019 15:26:50 -------------------------------------------------------------------------------- Wound Assessment Details Patient Name: Date of Service: [name] [name]. 03/18/2019 10:00 AM Medical Record [id number] Patient Account Number: [id number] Date of Birth/Sex: Treating RN: [birth year] (64 y.o. [name] Primary Care Altie Savard: SYSTEM, PCP Other Clinician: Referring [name] Voth: Treating Eliel Dudding/Extender:Robson, Dulcy Fanny, Fuller Plan in Treatment: 89 Wound Status Wound Number: 4 Primary MASD Moisture Associated Skin Damage Etiology: Wound Location: Right Flank Wound Open Wounding Event: Gradually Appeared Status: Date Acquired: 05/31/2018 Comorbid Cataracts, Lymphedema, Sleep Apnea, Weeks Of Treatment: 39 History: Hypertension, Type II Diabetes, End Stage Clustered Wound: Yes Renal Disease, Gout, Rheumatoid Arthritis, Neuropathy Wound Measurements Length: (cm) 0.5  % Reduction Width: (cm) 9.2 % Reduction Depth: (cm) 0.1 Epithelializ Clustered Quantity: 3 Tunneling: Area: (cm) 3.613 Undermining Volume: (cm) 0.361 Wound Description Wound Margin: Distinct, outline attached Exudate Amount: Medium Exudate Type: Serosanguineous Exudate Color: red, brown Wound Bed Granulation Amount: Large (67-100%) Granulation Quality: Pink Necrotic Amount: None Present (0%) Foul Odor After Cleansing: No Slough/Fibrino No Exposed Structure Fascia Exposed: No Fat Layer (Subcutaneous Tissue) Exposed: Yes Tendon Exposed: No Muscle Exposed: No Joint Exposed: No Bone Exposed: No in Area: 55.6% in Volume: 55.6% ation: Small (1-33%) No : No Treatment Notes Wound #4 (Right Flank) 1. Cleanse With Wound Cleanser 2. Periwound Care Skin Prep 3. Primary Dressing Applied Calcium Alginate 4. Secondary Dressing ABD Pad Drawtex 5. Secured With [facility]) Signed: 03/21/2019 7:44:20 AM By: Kela Millin Entered By: Kela Millin on 03/18/2019 10:21:26 -------------------------------------------------------------------------------- Vitals Details Patient Name: Date of Service: [name] [name]. 03/18/2019 10:00 AM Medical Record Number:[id number] Patient Account Number: [id number] Date of Birth/Sex: Treating RN: 06/06/1955 (64 y.o. [name] Primary Care Indra Wolters: SYSTEM, PCP Other Clinician: Referring Kentavius Dettore: Treating Gean Larose/Extender:Robson, Dulcy Fanny, Fuller Plan in Treatment: 89 Vital Signs Time Taken: 10:12 Temperature (F): 97.6 Height (in): 62 Pulse (bpm): 63 Weight (lbs): 260 Respiratory Rate (breaths/min): 20 Body Mass Index (BMI): 47.5 Blood Pressure (mmHg): 108/52 Capillary Blood Glucose (mg/dl): 157 Reference Range: 80 - 120 mg / dl Notes patient stated CBG this morning was 157 Electronic Signature(s) Signed: 03/21/2019 7:44:20 AM By: Kela Millin Entered By: Kela Millin on 03/18/2019 10:12:42

## 2019-03-25 ENCOUNTER — Encounter (HOSPITAL_BASED_OUTPATIENT_CLINIC_OR_DEPARTMENT_OTHER): Payer: Medicare Other | Admitting: Internal Medicine

## 2019-04-15 ENCOUNTER — Encounter (HOSPITAL_BASED_OUTPATIENT_CLINIC_OR_DEPARTMENT_OTHER): Payer: Medicare Other | Admitting: Internal Medicine

## 2019-04-22 ENCOUNTER — Other Ambulatory Visit: Payer: Self-pay

## 2019-04-22 ENCOUNTER — Encounter (HOSPITAL_BASED_OUTPATIENT_CLINIC_OR_DEPARTMENT_OTHER): Payer: Medicare Other | Attending: Internal Medicine | Admitting: Internal Medicine

## 2019-04-22 DIAGNOSIS — E1122 Type 2 diabetes mellitus with diabetic chronic kidney disease: Secondary | ICD-10-CM | POA: Diagnosis not present

## 2019-04-22 DIAGNOSIS — S31104D Unspecified open wound of abdominal wall, left lower quadrant without penetration into peritoneal cavity, subsequent encounter: Secondary | ICD-10-CM | POA: Insufficient documentation

## 2019-04-22 DIAGNOSIS — N184 Chronic kidney disease, stage 4 (severe): Secondary | ICD-10-CM | POA: Insufficient documentation

## 2019-04-22 DIAGNOSIS — I890 Lymphedema, not elsewhere classified: Secondary | ICD-10-CM | POA: Insufficient documentation

## 2019-04-22 DIAGNOSIS — B354 Tinea corporis: Secondary | ICD-10-CM | POA: Insufficient documentation

## 2019-04-22 DIAGNOSIS — Z6841 Body Mass Index (BMI) 40.0 and over, adult: Secondary | ICD-10-CM | POA: Insufficient documentation

## 2019-04-22 DIAGNOSIS — L98498 Non-pressure chronic ulcer of skin of other sites with other specified severity: Secondary | ICD-10-CM | POA: Diagnosis not present

## 2019-04-22 DIAGNOSIS — Y839 Surgical procedure, unspecified as the cause of abnormal reaction of the patient, or of later complication, without mention of misadventure at the time of the procedure: Secondary | ICD-10-CM | POA: Diagnosis not present

## 2019-04-22 DIAGNOSIS — T8131XD Disruption of external operation (surgical) wound, not elsewhere classified, subsequent encounter: Secondary | ICD-10-CM | POA: Insufficient documentation

## 2019-04-22 DIAGNOSIS — I129 Hypertensive chronic kidney disease with stage 1 through stage 4 chronic kidney disease, or unspecified chronic kidney disease: Secondary | ICD-10-CM | POA: Insufficient documentation

## 2019-04-22 DIAGNOSIS — E1140 Type 2 diabetes mellitus with diabetic neuropathy, unspecified: Secondary | ICD-10-CM | POA: Insufficient documentation

## 2019-04-22 NOTE — Progress Notes (Signed)
[name] [name], [name] [name] ([id number]) Visit Report for 04/22/2019 HPI Details Patient Name: Date of Service: [name] [name], [name] [name] 04/22/2019 10:00 AM Medical Record Number:[id number] Patient Account Number: [id number] Date of Birth/Sex: Treating RN: 1955-02-27 (64 y.o. [name] Primary Care Provider: SYSTEM, PCP Other Clinician: Referring Provider: Treating Provider/Extender:Laurin Morgenstern, Dulcy Fanny, Fuller Plan in Treatment: 53 History of Present Illness HPI Description: 06/29/17; this is a 64 year old woman who is a poor historian. It appears that she's had limited medical care over the last 2 years or so and what she had his through the [place]. She is here for review of a draining area on her right shoulder anteriorly as well as a nonhealing surgical wound in the left lower abdomen. Per her description she had a hysterectomy in 2006 4 "tumors". About 9 months later she had have another operation in the same area because of "tumors" involving her fallopian tubes and ovary. Sounds as though the surgical wound in this area that took a long time to heal. Apparently the area on her left lower abdomen is been open for the last 6 or 7 months. She is been applying topical antibiotics. 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 She had a fall in late 2018 and the open area on top of her shoulder. She is had draining pus coming out of this wound since she's been applying topical antibiotics and a Band-Aid. She is not complaining of a lot of pain or fever although she did have chills today. She saw her doctor at the [place] yesterday she doesn't know her name she apparently was referred to a surgeon at the [facility] and [place] presumably an orthopedic surgeon to look at that shoulder. She also has a primary doctor in [place] by the name of Dr. Jimmye Norman who referred her here after seeing her within the last month. 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 We don't have a lot of information in her in Epic. Last hospitalization was from 11/21/16 through 11/24/16 with hypoglycemia felt to be secondary to sulfonylureas. There is no mention of a wound at that time. 07/07/17; patient's shoulder cultured MSSA. I put her on clindamycin to had some anaerobic coverage with caution about diarrhea. I had asked her to make an orthopedic follow-up at the [place] with a doctor that made the original surgery but she has not done that yet. She also has a fairly large wound in the left lower abdomen in the middle of his surgical scar. This was apparently gynecologic surgery perhaps fibroid surgery. In any case these wounds are larger today. 07/21/17; the patient went to see the orthopedic doctor at the [facility] who did her original surgery surgery. Per the patient's description the draining area had closed down. He didn't think anything needed to be done there were no imaging studies. The lower abdominal wound in the left mid pelvis area also looks somewhat better to me. There is less surface material on this still some probing areas but not a lot of depth. We've been using silver alginate 08/03/17; the patient arrives back in 2 week follow-up. The draining sinus on her right shoulder has not reopened although she is complaining of widespread shoulder and other joint pain. She is using silver alginate to the remaining surgical wound on her abdomen. 08/18/17; the patient has 2 week follow-up appointment. The draining sinus on her right shoulder is not reopened.  She is complaining of left shoulder pain today. The presumed surgical wound on her lower abdomen looks quite a bit better and this is really contracted quite nicely. When she first came in the deep sinus she had on the anterior shoulder cultured MSSA. I gave her clindamycin. I arranged for orthopedic follow-up at the [facility] however  this is already closed nevertheless this was a deep probing tunnel and I think it needs to be watched for reopening. 09/07/17; surgical wound on her lower abdomen. This as 2 attached leaflets. The larger one is superiorly. A small probing area here with some sanguinous drainage. Surface needed debridement today. We've been using silver alginate however I changed this to silver collagen today 09/15/17. Surgical wound on her lower abdomen. No recent improvement although this is markedly improved from when we first started seeing her although the original problem was a wound on her right shoulder which is long since closed.switched her to Silver collagen last week She arrives in clinic today complaining of abdominal pain "so bad I almost went to the ER". She tells me she had an endoscopy and colonoscopy about a week ago. This was at the [place], I have no way to look at this. She is unaware of any biopsy was done 10/02/17 on evaluation today patient continues to do well in regard to her abdominal wound were using Prisma it's obviously taken some time for this to heal but nonetheless I do think that she is making good progress currently. There does not appear to be any evidence of infection at this time which is good news. She still does have some tunneling or undermining but again I think this is still doing well. 10/19/17; she continues to make good progress in this surgical abdominal wound using Prisma. This is come in nicely at one point had considerable depth. No evidence of infection. The patient is not complaining of pain in this area but is complaining of diabetic neuropathy pain in her right foot 11/02/17;the patient's wound is superficial and is really continued to make decent progress. I thought this would be more problematic when I first saw however this appears to a filled in nicely there is no depth. It exists in the crevice between what I think is lower abdominal hernias. She comes in  complaining of generalized pain however she is running out of her narcotics providedto her by a local pain clinic. She is asking for home health however she is [place] as I understand we cannot order this. 11/16/2017; 2-week follow-up. She has a lower abdominal surgical wound and this appears to be making nice progress closing in nicely each time we see it. This was initially a deep crevice surgical area between what I think is to lower abdominal hernias. In spite of this wound has healthy granulation. There does not appear to be any depth at all. There is probably a lot of tensile stress on the wound area however in spite of this things actually look better 11/30/2017; 2-week follow-up. She is complaining of left lower quadrant abdominal pain it is difficult to characterize this clearly episodic and not really associated with nausea or vomiting. She follows at the [place]. Tells me she was recently seen there by her new primary physician. The wound is in the lower abdomen in the middle of what I think was a gynecologic surgical site. We have made progress with this. 12/14/2017; 2-week follow-up. The patient comes in today with an improved wound area on the lower mid abdomen surgical  scar which was the wound we have been following her for. Paradoxically she has developed a new open area just to the right of the midline almost a mirror imaging wound on the right. She states this came up about a week ago. She does not have a clear explanation. Both of these wounds are in surgical scar areas although from her I think this surgery was remote. She denies scratching this or traumatizing it. It is surrounded by 2 large areas that I suspect are herniations but I still cannot see how this would have come from trauma. 12/28/2017; the area on the left surgical scar in the lower abdomen is better however the area on the right that we identified last time is worse. Increase in dimensions. There is no obvious infection  here. We have been using silver alginate. She tells me she has been to the ER at the [place] and they gave her doxycycline she also saw her primary doctor yesterday. I think she needs to be referred to dermatology consideration of a skin biopsy question panniculitis 01/11/2018; we continue to have improvement in the left lower mid abdominal wound however the area on the right that we identified in late October has gotten quite a bit worse. There is not appear to be surrounding infection although the patient is complaining of a lot of pain. We have been using silver alginate. [facility] I think this should help 01/24/18 on evaluation today patient actually apparently has two abdominal wounds the one on the right is new here and unfortunately seems to be doing a little bit worse and that it's a little bit larger. Fortunately there does not appear to be any sign of infection at this time. No fevers, chills, nausea, or vomiting noted at this time. Overall again it sounds like this is a fairly complex issue and despite the fact that we been using silver nitrate in the [facility] Dressing still has some hyper granulation. No fevers, chills, nausea, or vomiting noted at this time. 01/31/18 plan evaluation today patient appears to be doing better in general in regard to her wounds. She has been tolerating the dressing changes without complication. I do feel like that right now she's making some progress so she still is having some discomfort. 02/07/18 on evaluation today patient actually appears to be doing rather well in regard to her double ulcers as far as I'm concerned. With that being said she is somewhat frustrated in general in regard to the overall appearance of everything. I feel like this is unwarranted however since she seems to be making progress. 02/15/2018; lower mid abdomen right much greater than left although the left was the original  wound. The area on the left looks really quite good and is smaller. The area on the right seems longer and measures worse in terms of dimensions. These areas are in the most dependent part of her large abdominal pannus and this may be the entire issue. at [facility] on the left there is lymphedema around the wound. The patient is seeing dermatology next week apparently in [facility] 03/01/2017; she likely had abdomen. Right now he other than left ankle both he has better today. I think both of these areas are smaller. Surface looks healthier she is due to see dermatology later this month. Using [facility] 1/23; abdominal wounds in the lower mid abdomen. This is in a very dependent area in her lower abdominal pannus and the simple  tension in this area may have a lot to do with this. She also has constant chronic pruritus and scratching and I wonder if this has something to do with open wounds in this area. She tells me her daughter is helping her with the dressings currently and the original wound on the left side of her mid abdomen is better almost closed the larger area on the right also looks some better. She sees a dermatologist next week I have told her to ask the dermatologist for help with generalized pruritus. The probable skin wounds that she has all over secondary to scratching. The wounds we have been treating may be related to simple tension in a dependent lymphedematous area. She could benefit from an abdominal binder to help support this area 2/13; abdominal wounds in the lower mid abdomen. Very dependent in her abdomen and this may be secondary to tension in the lower part of her abdominal pannus plus or minus edema. 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 She claims not to be scratching at the wound areas. 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 They give her gentian violet to put on the wound area but this is already present and Hydrofera Blue. The original wound on the left is healed today. However the area on the right part of her mid abdominal pannus appears larger. We are going to try to work with her on getting an abdominal binder to help support the weight of the pannus around the wounds 3/9; abdominal wound in the lower mid abdomen. She apparently has an abdominal binder that she bought on [employer]. She is using Hydrofera Blue to the wound on the right of the midline. The original wound was on the left and is closed over. 3/26; abdominal wound in the lower mid abdomen. This is her second wound in this area. The lower part of her abdominal pannus surrounding surgical scar. Once again she is not wearing the abdominal binder she said she obtained. The dressing is Hydrofera Blue 4/16; abdomen in the right lower mid abdomen. This is the second wound in this area the other was a mirror image area on the left. I had her seen by dermatology at the [facility] this was not very helpful. She is not wearing her abdominal binder. We have been using Hydrofera Blue which is what healed at the other side. The abdominal wound is actually larger She arrives in clinic today complaining of the plantar right heel. Very tender. She has been pushing herself up in her bed with her heels and this may be a deep tissue injury. 4/23; abdomen wound is about the same. Spreading along the folds of her pannus somewhat but generally the surface looks somewhat better. She is not using her abdominal binder although she did bring it in this time She arrives in clinic complaining of pain and itching in the right lateral rib area. She has 3 open areas here which are circular wounds. These are new She also mentions her lower sacrum and there are 2 wounds in this area. These are new 5/7;  abdominal wound is about the same although in general it looks better than a few weeks ago at which time it had punched out depth. No debridement is required. She will not wear her abdominal binder  She has areas on her right lateral rib cage 3 open areas circular wounds these look about the same as last time. I am assuming these have something to do with the prurigo nodularis that she may have scratched but I am not certain. Finally she has a linear area on her lower sacrum which I am assuming is a pressure area 5/21-Patient returns at 2 weeks for abdominal wound, areas on the right lateral rib cage, and lower sacrum linear area which is a pressure sore. We have been using silver alginate to all these wounds. She has been asked to wear abdominal binder which she is not. she has 2 new wounds on right flank area and on her coccyx. she has a total of 6 wounds 6/9; I have been following this patient for a prolonged period of time for wounds on her lower mid abdomen which I think were secondary to severe lymphedema. She had an area on the left of the midline that eventually healed only to open on the right side. This is still open we have been using silver alginate. She will not use an abdominal binder. About a month ago she arrived here with open areas on the right lateral ribs. This is in the middle of a large amount of soft tissue/pannus fold. She has 4 open areas here at this time. Finally she has 2 open areas in her lower coccyx. We have been using silver alginate on all the wound areas 6/25; we are following this woman for wounds on her lower mid abdomen in the setting of a pannus and severe dependent lymphedema. More recently she has had areas in the folds on the right lateral rib area. Culture I did of this area last time was negative. Finally she has an area on the coccyx. We have been using silver alginate to all the wound areas 7/9; this is a patient with morbid obesity severe lymphedema in  her lower abdominal pannus/severe dependent abdominal lymphedema. She first came to this clinic with an area on the left lower central abdomen. This eventually healed over. About the same time as it did heal over she developed an area in close juxtaposition on the other side of the midline. We have been following this for some months when she was here last time I thought things were improving although she a lot arrives today with a larger wound. She will not use her external abdominal binder. Over the last several weeks she has had areas on her right lateral rib area. The exact etiology of this is unclear we have been using silver alginate. She arrives today with the entire area inflamed angry with loss of surface epithelium and marked pruritus. I think this is tinea/Candida. This could be secondary or the primary in the etiology of these expanding areas. 7/23; the area on the right lateral rib areas is quite a bit better. Skin that we treated with oral and topical antifungals looks a lot better around this area. Unfortunately the lower abdominal area actually measures larger. There is no drainage here really no surrounding erythema. 8/6-Patient arrives to the clinic at 3 weeks, the right lateral rib cage area has copious drainage but the wounds look a little better, the abdominal midline wound unfortunately is larger, she is not able to use the binder both wounds have a clean base 8/20. Since the patient was last here she was hospitalized from 813 through 818. This was predominantly because of hyperkalemia secondary to lisinopril and dehydration. Lisinopril was  discontinued at discharge. She was also felt to have mild cellulitis around several skin ulcers she was given antibiotics in the hospital but not discharged on antibiotics. Also noted that she has stage IV chronic kidney disease with a baseline creatinine at 1.7. 9/3 the patient still has a 2 wound areas lower mid abdominal pannus and the  right lateral ribs. We have been using polymen to the abdominal wound and silver alginate to the area on the folds on her right lateral ribs. She is complaining about pruritus around the areas on the ribs. 9/17; 2-week follow-up. The area on the mid lower abdomen better using PolyMem. She has 4 small areas in the pannus fold on the right lateral ribs and is a new area on the left lateral lower abdominal folds this week. 10/1; 2-week follow-up. She has been using PolyMem on the mid lower abdomen and really not much improvement. She has this for small areas in the pannus fold of the right lateral ribs. These are gradually I think improving. The area that was new last time on the left lower abdominal pannus has closed over. 10/15; 2-week follow-up. I changed to silver collagen on the mid lower abdomen wound which seems smaller today. She is still using silver alginate on the right lateral ribs and a pannus fold and this looks better today although she is complaining of itching in this area She has had an outbreak on her face which almost looks like cystic acne. She is going to see her primary doctor about this tomorrow 11/5; silver collagen on the mid lower abdomen which seems to be doing a good job we are using silver alginate in the pannus folds on her right lateral ribs. She is complaining of pruritus in this area which is probably intertrigo/Candida. I will prescribe nystatin powder 12/3. We are using silver collagen to her mid lower abdomen which looks better. The pannus area on the right lateral ribs we are using silver alginate she is pointing nystatin powder in here. The wounds look better. She is separating the folds with hand towels. We have suggested Interdry and she is going to look into getting that online 12/28; 1 month follow-up. In general the area on her mid lower abdomen looks a lot better. Surface area is smaller we have been using silver collagen. The pannus area on the right  lateral ribs we have been using alginate. She also is putting Interdry in these areas. 1/25; 1 month follow-up. The area on her abdomen is about the same. We have been using silver collagen here. The areas on her right thorax and the pannus folds in this area are down to 3 small wounds we have been using silver alginate here 3/1; the area on her pannus of the lower abdomen on the right of midline is about the same. We have been using silver collagen for a protracted period and previously we have used Hydrofera Blue. The area is in a pannus fold in her right lateral thorax actually are smaller. There are 3 wounds in this area. She is using calcium alginate here. She is complaining of increasing ankle edema she saw her primary doctor at the [place] in [place] and had her Lasix increased otherwise she has no additional new complaint Electronic Signature(s) Signed: 04/22/2019 5:45:55 PM By: Linton Ham MD Entered By: Linton Ham on 04/22/2019 11:14:41 -------------------------------------------------------------------------------- Physical Exam Details Patient Name: Date of Service: [name] [name]. 04/22/2019 10:00 AM Medical Record [id number] Patient Account Number: [id number] Date of  Birth/Sex: Treating RN: 12-16-1955 (64 y.o. [name] Primary Care Provider: SYSTEM, PCP Other Clinician: Referring Provider: Treating Provider/Extender:Toussaint Golson, Dulcy Fanny, Fuller Plan in Treatment: 94 Constitutional Sitting or standing Blood Pressure is within target range for patient.. Pulse regular and within target range for patient.Marland Kitchen Respirations regular, non-labored and within target range.. Temperature is normal and within the target range for the patient.Marland Kitchen Appears in no distress. Notes Wound exam; midline lower abdomen just to the right of midline. I think this is about the same as last time. sHe has rims she has rims of epithelialization is not filled in at all.  Nothing looks infected here. The area on the right lateral rib cage is in the pannus fold 3 small areas I think these are smaller than last time. There is no evidence of infection or tinea in this area. At one point she had what looked to be intertrigo all of that looks a lot better. Electronic Signature(s) Signed: 04/22/2019 5:45:55 PM By: Linton Ham MD Entered By: Linton Ham on 04/22/2019 11:15:48 -------------------------------------------------------------------------------- Physician Orders Details Patient Name: Date of Service: [name] [name], [name] [name] 04/22/2019 10:00 AM Medical Record Number:[id number] Patient Account Number: [id number] Date of Birth/Sex: Treating RN: 1956/02/18 (64 y.o. 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 Dressing Change Frequency Other: - all wounds - change dressing twice a week. Skin Barriers/Peri-Wound Care Skin Prep Wound Cleansing May shower and wash wound with soap and water. Primary Wound Dressing Wound #3 Right Abdomen - Lower Quadrant Polymem Silver Wound #4 Right Flank Calcium Alginate Wound #7 Right,Proximal Flank Calcium Alginate Secondary Dressing Wound #3 Right Abdomen - Lower Quadrant Dry Gauze ABD pad - secure with tape Wound #4 Right Flank ABD pad Drawtex Wound #7  Right,Proximal Flank ABD pad Drawtex Off-Loading Turn and reposition every 2 hours Other: - abdominal binder to support abdomen. Patient to float right heel while in chair or bed with pillow. Patient to keep pressure off right heel as much as possible. Roll up a face cloth or a pillowcase to separate all skin folds- under breast, abdomen, both flank areas. Additional Orders / Instructions Other: - ***[facility] INTERDRY 10"x 12 feet roll or patient to purchase offline from [employer].*** [facility] wound care. - [facility] Electronic Signature(s) Signed: 04/22/2019 5:45:55 PM By: Linton Ham MD Signed: 04/22/2019 5:57:14 PM By: Levan Hurst RN, BSN Entered By: Levan Hurst on 04/22/2019 11:10:21 -------------------------------------------------------------------------------- Problem List Details Patient Name: Date of Service: [name] [name], [name] [name] 04/22/2019 10:00 AM Medical Record [id number] Patient Account Number: [id number] Date of Birth/Sex: Treating RN: Feb 26, 1955 (64 y.o. [name] Primary Care Provider: SYSTEM, PCP Other Clinician: Referring Provider: Treating Provider/Extender:Jashanti Clinkscale, Dulcy Fanny, Fuller Plan in Treatment: 94 Active Problems ICD-10 Evaluated Encounter Code Description Active Date Today Diagnosis T81.31XD Disruption of external operation (surgical) wound, not 06/29/2017 No Yes elsewhere classified, subsequent encounter S31.104D Unspecified open wound of abdominal wall, left lower 06/29/2017 No Yes quadrant without penetration into peritoneal cavity, subsequent encounter  L98.498 Non-pressure chronic ulcer of skin of other sites with 06/14/2018 No Yes other specified severity I89.0 Lymphedema, not elsewhere classified 08/30/2018 No Yes B35.4 Tinea corporis 08/30/2018 No Yes Inactive Problems ICD-10 Code Description Active Date Inactive Date M71.011 Abscess of bursa, right  shoulder 06/29/2017 06/29/2017 L89.616 Pressure-induced deep tissue damage of right heel 06/07/2018 06/07/2018 L89.150 Pressure ulcer of sacral region, unstageable 06/14/2018 06/14/2018 Resolved Problems Electronic Signature(s) Signed: 04/22/2019 5:45:55 PM By: Linton Ham MD Entered By: Linton Ham on 04/22/2019 11:11:47 -------------------------------------------------------------------------------- Progress Note Details Patient Name: Date of Service: [name] [name]. 04/22/2019 10:00 AM Medical Record [id number] Patient Account Number: [id number] Date of Birth/Sex: Treating RN: [birth year] (64 y.o. [name] Primary Care Provider: Other Clinician: SYSTEM, PCP Referring Provider: Treating Provider/Extender:Kizzy Olafson, Dulcy Fanny, Fuller Plan in Treatment: 94 Subjective History of Present Illness (HPI) 06/29/17; this is a 64 year old woman who is a poor historian. It appears that she's had limited medical care over the last 2 years or so and what she had his through the [place]. She is here for review of a draining area on her right shoulder anteriorly as well as a nonhealing surgical wound in the left lower abdomen. Per her description she had a hysterectomy in 2006 4 "tumors". About 9 months later she had have another operation in the same area because of "tumors" involving her fallopian tubes and ovary. Sounds as though the surgical wound in this area that took a long time to heal. Apparently the area on her left lower abdomen is been open for the last 6 or 7 months. She is been applying topical antibiotics. 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 She had a fall in late 2018 and the open area on top of her shoulder. She is had draining pus coming out of this wound since she's been applying topical antibiotics and a Band-Aid. She is not complaining of a lot of pain or fever although  she did have chills today. She saw her doctor at the [place] yesterday she doesn't know her name she apparently was referred to a surgeon at the [facility] and [place] presumably an orthopedic surgeon to look at that shoulder. She also has a primary doctor in [place] by the name of Dr. Jimmye Norman who referred her here after seeing her within the last month. 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 We don't have a lot of information in her in Epic. Last hospitalization was from 11/21/16 through 11/24/16 with hypoglycemia felt to be secondary to sulfonylureas. There is no mention of a wound at that time. 07/07/17; patient's shoulder cultured MSSA. I put her on clindamycin to had some anaerobic coverage with caution about diarrhea. I had asked her to make an orthopedic follow-up at the [place] with a doctor that made the original surgery but she has not done that yet. ooShe also has a fairly large wound in the left lower abdomen in the middle of his surgical scar. This was apparently gynecologic surgery perhaps fibroid surgery. In any case these wounds are larger today. 07/21/17; the patient went to see the orthopedic doctor at the [facility] who did her original surgery surgery. Per the patient's description the draining area had closed down. He didn't think anything needed to be done there were no imaging studies. ooThe lower abdominal wound in the left mid pelvis area also looks somewhat better to me. There is less  surface material on this still some probing areas but not a lot of depth. We've been using silver alginate 08/03/17; the patient arrives back in 2 week follow-up. The draining sinus on her right shoulder has not reopened although she is complaining of widespread shoulder and other joint pain. She is using silver alginate to the remaining surgical wound on her abdomen. 08/18/17; the patient has 2 week follow-up appointment. The draining sinus on her  right shoulder is not reopened. She is complaining of left shoulder pain today. The presumed surgical wound on her lower abdomen looks quite a bit better and this is really contracted quite nicely. When she first came in the deep sinus she had on the anterior shoulder cultured MSSA. I gave her clindamycin. I arranged for orthopedic follow-up at the [facility] however this is already closed nevertheless this was a deep probing tunnel and I think it needs to be watched for reopening. 09/07/17; surgical wound on her lower abdomen. This as 2 attached leaflets. The larger one is superiorly. A small probing area here with some sanguinous drainage. Surface needed debridement today. We've been using silver alginate however I changed this to silver collagen today 09/15/17. Surgical wound on her lower abdomen. No recent improvement although this is markedly improved from when we first started seeing her although the original problem was a wound on her right shoulder which is long since closed.switched her to Silver collagen last week She arrives in clinic today complaining of abdominal pain "so bad I almost went to the ER". She tells me she had an endoscopy and colonoscopy about a week ago. This was at the [place], I have no way to look at this. She is unaware of any biopsy was done 10/02/17 on evaluation today patient continues to do well in regard to her abdominal wound were using Prisma it's obviously taken some time for this to heal but nonetheless I do think that she is making good progress currently. There does not appear to be any evidence of infection at this time which is good news. She still does have some tunneling or undermining but again I think this is still doing well. 10/19/17; she continues to make good progress in this surgical abdominal wound using Prisma. This is come in nicely at one point had considerable depth. No evidence of infection. The patient is not complaining of pain in this area but is  complaining of diabetic neuropathy pain in her right foot 11/02/17;the patient's wound is superficial and is really continued to make decent progress. I thought this would be more problematic when I first saw however this appears to a filled in nicely there is no depth. It exists in the crevice between what I think is lower abdominal hernias. She comes in complaining of generalized pain however she is running out of her narcotics providedto her by a local pain clinic. She is asking for home health however she is [place] as I understand we cannot order this. 11/16/2017; 2-week follow-up. She has a lower abdominal surgical wound and this appears to be making nice progress closing in nicely each time we see it. This was initially a deep crevice surgical area between what I think is to lower abdominal hernias. In spite of this wound has healthy granulation. There does not appear to be any depth at all. There is probably a lot of tensile stress on the wound area however in spite of this things actually look better 11/30/2017; 2-week follow-up. She is complaining of left lower  quadrant abdominal pain it is difficult to characterize this clearly episodic and not really associated with nausea or vomiting. She follows at the [place]. Tells me she was recently seen there by her new primary physician. The wound is in the lower abdomen in the middle of what I think was a gynecologic surgical site. We have made progress with this. 12/14/2017; 2-week follow-up. The patient comes in today with an improved wound area on the lower mid abdomen surgical scar which was the wound we have been following her for. Paradoxically she has developed a new open area just to the right of the midline almost a mirror imaging wound on the right. She states this came up about a week ago. She does not have a clear explanation. Both of these wounds are in surgical scar areas although from her I think this surgery was remote. She denies  scratching this or traumatizing it. It is surrounded by 2 large areas that I suspect are herniations but I still cannot see how this would have come from trauma. 12/28/2017; the area on the left surgical scar in the lower abdomen is better however the area on the right that we identified last time is worse. Increase in dimensions. There is no obvious infection here. We have been using silver alginate. She tells me she has been to the ER at the [place] and they gave her doxycycline she also saw her primary doctor yesterday. I think she needs to be referred to dermatology consideration of a skin biopsy question panniculitis 01/11/2018; we continue to have improvement in the left lower mid abdominal wound however the area on the right that we identified in late October has gotten quite a bit worse. There is not appear to be surrounding infection although the patient is complaining of a lot of pain. We have been using silver alginate. [facility] I think this should help 01/24/18 on evaluation today patient actually apparently has two abdominal wounds the one on the right is new here and unfortunately seems to be doing a little bit worse and that it's a little bit larger. Fortunately there does not appear to be any sign of infection at this time. No fevers, chills, nausea, or vomiting noted at this time. Overall again it sounds like this is a fairly complex issue and despite the fact that we been using silver nitrate in the [facility] Dressing still has some hyper granulation. No fevers, chills, nausea, or vomiting noted at this time. 01/31/18 plan evaluation today patient appears to be doing better in general in regard to her wounds. She has been tolerating the dressing changes without complication. I do feel like that right now she's making some progress so she still is having some discomfort. 02/07/18 on evaluation today patient actually appears  to be doing rather well in regard to her double ulcers as far as I'm concerned. With that being said she is somewhat frustrated in general in regard to the overall appearance of everything. I feel like this is unwarranted however since she seems to be making progress. 02/15/2018; lower mid abdomen right much greater than left although the left was the original wound. The area on the left looks really quite good and is smaller. The area on the right seems longer and measures worse in terms of dimensions. These areas are in the most dependent part of her large abdominal pannus and this may be the entire issue. at [facility] on the  left there is lymphedema around the wound. The patient is seeing dermatology next week apparently in [facility] 03/01/2017; she likely had abdomen. Right now he other than left ankle both he has better today. I think both of these areas are smaller. Surface looks healthier she is due to see dermatology later this month. Using [facility] 1/23; abdominal wounds in the lower mid abdomen. This is in a very dependent area in her lower abdominal pannus and the simple tension in this area may have a lot to do with this. She also has constant chronic pruritus and scratching and I wonder if this has something to do with open wounds in this area. She tells me her daughter is helping her with the dressings currently and the original wound on the left side of her mid abdomen is better almost closed the larger area on the right also looks some better. She sees a dermatologist next week I have told her to ask the dermatologist for help with generalized pruritus. The probable skin wounds that she has all over secondary to scratching. The wounds we have been treating may be related to simple tension in a dependent lymphedematous area. She could benefit from an abdominal binder to help support this area 2/13; abdominal wounds in the lower mid abdomen. Very dependent in her abdomen and this  may be secondary to tension in the lower part of her abdominal pannus plus or minus edema. 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 She claims not to be scratching at the wound areas. 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 They give her gentian violet to put on the wound area but this is already present and Hydrofera Blue. The original wound on the left is healed today. However the area on the right part of her mid abdominal pannus appears larger. We are going to try to work with her on getting an abdominal binder to help support the weight of the pannus around the wounds 3/9; abdominal wound in the lower mid abdomen. She apparently has an abdominal binder that she bought on [employer]. She is using Hydrofera Blue to the wound on the right of the midline. The original wound was on the left and is closed over. 3/26; abdominal wound in the lower mid abdomen. This is her second wound in this area. The lower part of her abdominal pannus surrounding surgical scar. Once again she is not wearing the abdominal binder she said she obtained. The dressing is Hydrofera Blue 4/16; abdomen in the right lower mid abdomen. This is the second wound in this area the other was a mirror image area on the left. I had her seen by dermatology at the [facility] this was not very helpful. She is not wearing her abdominal binder. We have been using Hydrofera Blue which is what healed at the other side. The abdominal wound is actually larger She arrives in clinic today complaining of the plantar right heel. Very tender. She has been pushing herself up in her bed with her heels and this may be a deep tissue injury. 4/23; abdomen wound is about the same. Spreading along the folds of her pannus somewhat but generally  the surface looks somewhat better. She is not using her abdominal binder although she did bring it in this time ooShe arrives  in clinic complaining of pain and itching in the right lateral rib area. She has 3 open areas here which are circular wounds. These are new ooShe also mentions her lower sacrum and there are 2 wounds in this area. These are new 5/7; abdominal wound is about the same although in general it looks better than a few weeks ago at which time it had punched out depth. No debridement is required. She will not wear her abdominal binder ooShe has areas on her right lateral rib cage 3 open areas circular wounds these look about the same as last time. I am assuming these have something to do with the prurigo nodularis that she may have scratched but I am not certain. Finally she has a linear area on her lower sacrum which I am assuming is a pressure area 5/21-Patient returns at 2 weeks for abdominal wound, areas on the right lateral rib cage, and lower sacrum linear area which is a pressure sore. We have been using silver alginate to all these wounds. She has been asked to wear abdominal binder which she is not. she has 2 new wounds on right flank area and on her coccyx. she has a total of 6 wounds 6/9; I have been following this patient for a prolonged period of time for wounds on her lower mid abdomen which I think were secondary to severe lymphedema. She had an area on the left of the midline that eventually healed only to open on the right side. This is still open we have been using silver alginate. She will not use an abdominal binder. About a month ago she arrived here with open areas on the right lateral ribs. This is in the middle of a large amount of soft tissue/pannus fold. She has 4 open areas here at this time. Finally she has 2 open areas in her lower coccyx. We have been using silver alginate on all the wound areas 6/25; we are following this woman for wounds on her  lower mid abdomen in the setting of a pannus and severe dependent lymphedema. More recently she has had areas in the folds on the right lateral rib area. Culture I did of this area last time was negative. Finally she has an area on the coccyx. We have been using silver alginate to all the wound areas 7/9; this is a patient with morbid obesity severe lymphedema in her lower abdominal pannus/severe dependent abdominal lymphedema. She first came to this clinic with an area on the left lower central abdomen. This eventually healed over. About the same time as it did heal over she developed an area in close juxtaposition on the other side of the midline. We have been following this for some months when she was here last time I thought things were improving although she a lot arrives today with a larger wound. She will not use her external abdominal binder. Over the last several weeks she has had areas on her right lateral rib area. The exact etiology of this is unclear we have been using silver alginate. She arrives today with the entire area inflamed angry with loss of surface epithelium and marked pruritus. I think this is tinea/Candida. This could be secondary or the primary in the etiology of these expanding areas. 7/23; the area on the right lateral rib areas is quite a bit better. Skin that we treated with oral and topical antifungals looks a lot better around this area. Unfortunately the lower abdominal area actually measures larger. There  is no drainage here really no surrounding erythema. 8/6-Patient arrives to the clinic at 3 weeks, the right lateral rib cage area has copious drainage but the wounds look a little better, the abdominal midline wound unfortunately is larger, she is not able to use the binder both wounds have a clean base 8/20. Since the patient was last here she was hospitalized from 813 through 818. This was predominantly because of hyperkalemia secondary to lisinopril and  dehydration. Lisinopril was discontinued at discharge. She was also felt to have mild cellulitis around several skin ulcers she was given antibiotics in the hospital but not discharged on antibiotics. Also noted that she has stage IV chronic kidney disease with a baseline creatinine at 1.7. 9/3 the patient still has a 2 wound areas lower mid abdominal pannus and the right lateral ribs. We have been using polymen to the abdominal wound and silver alginate to the area on the folds on her right lateral ribs. She is complaining about pruritus around the areas on the ribs. 9/17; 2-week follow-up. The area on the mid lower abdomen better using PolyMem. She has 4 small areas in the pannus fold on the right lateral ribs and is a new area on the left lateral lower abdominal folds this week. 10/1; 2-week follow-up. She has been using PolyMem on the mid lower abdomen and really not much improvement. She has this for small areas in the pannus fold of the right lateral ribs. These are gradually I think improving. The area that was new last time on the left lower abdominal pannus has closed over. 10/15; 2-week follow-up. I changed to silver collagen on the mid lower abdomen wound which seems smaller today. She is still using silver alginate on the right lateral ribs and a pannus fold and this looks better today although she is complaining of itching in this area She has had an outbreak on her face which almost looks like cystic acne. She is going to see her primary doctor about this tomorrow 11/5; silver collagen on the mid lower abdomen which seems to be doing a good job we are using silver alginate in the pannus folds on her right lateral ribs. She is complaining of pruritus in this area which is probably intertrigo/Candida. I will prescribe nystatin powder 12/3. We are using silver collagen to her mid lower abdomen which looks better. The pannus area on the right lateral ribs we are using silver alginate  she is pointing nystatin powder in here. The wounds look better. She is separating the folds with hand towels. We have suggested Interdry and she is going to look into getting that online 12/28; 1 month follow-up. In general the area on her mid lower abdomen looks a lot better. Surface area is smaller we have been using silver collagen. The pannus area on the right lateral ribs we have been using alginate. She also is putting Interdry in these areas. 1/25; 1 month follow-up. The area on her abdomen is about the same. We have been using silver collagen here. The areas on her right thorax and the pannus folds in this area are down to 3 small wounds we have been using silver alginate here 3/1; the area on her pannus of the lower abdomen on the right of midline is about the same. We have been using silver collagen for a protracted period and previously we have used Hydrofera Blue. ooThe area is in a pannus fold in her right lateral thorax actually are smaller. There are 3  wounds in this area. She is using calcium alginate here. She is complaining of increasing ankle edema she saw her primary doctor at the [place] in [place] and had her Lasix increased otherwise she has no additional new complaint Objective Constitutional Sitting or standing Blood Pressure is within target range for patient.. Pulse regular and within target range for patient.Marland Kitchen Respirations regular, non-labored and within target range.. Temperature is normal and within the target range for the patient.Marland Kitchen Appears in no distress. Vitals Time Taken: 10:19 AM, Height: 62 in, Weight: 260 lbs, BMI: 47.5, Temperature: 98.4 F, Pulse: 71 bpm, Respiratory Rate: 20 breaths/min, Blood Pressure: 112/52 mmHg. General Notes: Wound exam; midline lower abdomen just to the right of midline. I think this is about the same as last time. sHe has rims she has rims of epithelialization is not filled in at all. Nothing looks infected here. ooThe area on  the right lateral rib cage is in the pannus fold 3 small areas I think these are smaller than last time. There is no evidence of infection or tinea in this area. At one point she had what looked to be intertrigo all of that looks a lot better. Integumentary (Hair, Skin) Wound #3 status is Open. Original cause of wound was Gradually Appeared. The wound is located on the Right Abdomen - Lower Quadrant. The wound measures 7cm length x 2.1cm width x 0.1cm depth; 11.545cm^2 area and 1.155cm^3 volume. There is Fat Layer (Subcutaneous Tissue) Exposed exposed. There is no tunneling or undermining noted. There is a medium amount of serosanguineous drainage noted. The wound margin is distinct with the outline attached to the wound base. There is large (67-100%) red, pink granulation within the wound bed. There is no necrotic tissue within the wound bed. Wound #4 status is Open. Original cause of wound was Gradually Appeared. The wound is located on the Right Flank. The wound measures 0.7cm length x 0.3cm width x 0.1cm depth; 0.165cm^2 area and 0.016cm^3 volume. There is Fat Layer (Subcutaneous Tissue) Exposed exposed. There is a medium amount of serosanguineous drainage noted. The wound margin is distinct with the outline attached to the wound base. There is large (67-100%) pink granulation within the wound bed. There is no necrotic tissue within the wound bed. Wound #7 status is Open. Original cause of wound was Gradually Appeared. The wound is located on the Right,Proximal Flank. The wound measures 0.7cm length x 0.3cm width x 0.1cm depth; 0.165cm^2 area and 0.016cm^3 volume. There is Fat Layer (Subcutaneous Tissue) Exposed exposed. There is no tunneling or undermining noted. There is a medium amount of serosanguineous drainage noted. The wound margin is flat and intact. There is large (67-100%) red granulation within the wound bed. Assessment Active Problems ICD-10 Disruption of external operation  (surgical) wound, not elsewhere classified, subsequent encounter Unspecified open wound of abdominal wall, left lower quadrant without penetration into peritoneal cavity, subsequent encounter Non-pressure chronic ulcer of skin of other sites with other specified severity Lymphedema, not elsewhere classified Tinea corporis Plan Follow-up Appointments: Return appointment in 1 month. Dressing Change Frequency: Other: - all wounds - change dressing twice a week. Skin Barriers/Peri-Wound Care: Skin Prep Wound Cleansing: May shower and wash wound with soap and water. Primary Wound Dressing: Wound #3 Right Abdomen - Lower Quadrant: Polymem Silver Wound #4 Right Flank: Calcium Alginate Wound #7 Right,Proximal Flank: Calcium Alginate Secondary Dressing: Wound #3 Right Abdomen - Lower Quadrant: Dry Gauze ABD pad - secure with tape Wound #4 Right Flank: ABD pad Drawtex Wound #  7 Right,Proximal Flank: ABD pad Drawtex Off-Loading: Turn and reposition every 2 hours Other: - abdominal binder to support abdomen. Patient to float right heel while in chair or bed with pillow. Patient to keep pressure off right heel as much as possible. Roll up a face cloth or a pillowcase to separate all skin folds- under breast, abdomen, both flank areas. Additional Orders / Instructions: Other: - ***[facility] INTERDRY 10"x 12 feet roll or patient to purchase offline from [employer].*** Home Health: [facility] skilled nursing for wound care. - [facility] 1. We are going to continuous calcium alginate in the right lateral thorax area wounds. 2. I change the primary dressing to regular polymen on the abdomen. This is in a difficult area in the most dependent part of her abdominal pannus. A lot of this may be simply tension pressure when she stands in terms of not opening although we did get the original wound on the left the heel. Electronic Signature(s) Signed: 04/22/2019  5:45:55 PM By: Linton Ham MD Entered By: Linton Ham on 04/22/2019 11:16:50 -------------------------------------------------------------------------------- SuperBill Details Patient Name: Date of Service: [name] [name] 04/22/2019 Medical Record Number:[id number] Patient Account Number: [id number] Date of Birth/Sex: Treating RN: 01/19/1956 (64 y.o. [name] Primary Care Provider: SYSTEM, PCP Other Clinician: Referring Provider: Treating Provider/Extender:Salena Ortlieb, Dulcy Fanny, Fuller Plan in Treatment: 94 Diagnosis Coding ICD-10 Codes Code Description Disruption of external operation (surgical) wound, not elsewhere classified, subsequent T81.31XD encounter Unspecified open wound of abdominal wall, left lower quadrant without penetration into peritoneal S31.104D cavity, subsequent encounter L98.498 Non-pressure chronic ulcer of skin of other sites with other specified severity I89.0 Lymphedema, not elsewhere classified B35.4 Tinea corporis Facility Procedures Physician Procedures CPT4 Description: Code 3818299 [id number] - WC PHYS LEVEL 3 - EST PT ICD-10 Diagnosis Description T81.31XD Disruption of external operation (surgical) wound, not el subsequent encounter S31.104D Unspecified open wound of abdominal wall, left lower quad into  peritoneal cavity, subsequent encounter L98.498 Non-pressure chronic ulcer of skin of other sites with ot Modifier: sewhere classifi rant without pen her specified se Quantity: 1 ed, etration Training and development officer) Signed: 04/22/2019 5:45:55 PM By: Linton Ham MD Signed: 04/22/2019 5:57:14 PM By: Levan Hurst RN, BSN Entered By: Levan Hurst on 04/22/2019 17:25:59

## 2019-04-23 NOTE — Progress Notes (Addendum)
ADYSSON, REVELLE ([id number]) Visit Report for 04/22/2019 Arrival Information Details Patient Name: Date of Service: [name] [name], MEDINE 04/22/2019 10:00 AM Medical Record Number:[id number] Patient Account Number: [id number] Date of Birth/Sex: Treating RN: [birth year] (64 y.o. Orvan Falconer Primary Care Persais Ethridge: SYSTEM, PCP Other Clinician: Referring Irena Gaydos: Treating Shantelle Alles/Extender:Robson, Dulcy Fanny, Fuller Plan in Treatment: 58 Visit Information History Since Last Visit All ordered tests and consults were completed: No Patient Arrived: Wheel Chair Added or deleted any medications: No Arrival Time: 10:18 Any new allergies or adverse reactions: No Accompanied By: self Had a fall or experienced change in No activities of daily living that may affect Transfer Assistance: Manual risk of falls: Patient Identification Verified: Yes Signs or symptoms of abuse/neglect since last No Secondary Verification Process Completed: Yes visito Patient Requires Transmission-Based No Hospitalized since last visit: No Precautions: Implantable device outside of the clinic excluding No Patient Has Alerts: No cellular tissue based products placed in the center since last visit: Has Dressing in Place as Prescribed: Yes Pain Present Now: No Electronic Signature(s) Signed: 04/23/2019 9:14:02 AM By: Carlene Coria RN Entered By: Carlene Coria on 04/22/2019 10:19:46 -------------------------------------------------------------------------------- Clinic Level of Care Assessment Details Patient Name: Date of Service: [name] [name], CASA 04/22/2019 10:00 AM Medical Record Number:[id number] Patient Account Number: [id number] Date of Birth/Sex: Treating RN: [birth year] (64 y.o. [name] Primary Care Shylah Dossantos: SYSTEM, PCP Other Clinician: Referring Zuha Dejonge: Treating Laurenashley Viar/Extender:Robson, Dulcy Fanny, Fuller Plan in Treatment: 94 Clinic Level of Care Assessment Items TOOL 4  Quantity Score X - Use when only an EandM is performed on FOLLOW-UP visit 1 0 ASSESSMENTS - Nursing Assessment / Reassessment X - Reassessment of Co-morbidities (includes updates in patient status) 1 10 X - Reassessment of Adherence to Treatment Plan 1 5 ASSESSMENTS - Wound and Skin Assessment / Reassessment []  - Simple Wound Assessment / Reassessment - one wound 0 X - Complex Wound Assessment / Reassessment - multiple wounds 3 5 []  - Dermatologic / Skin Assessment (not related to wound area) 0 ASSESSMENTS - Focused Assessment []  - Circumferential Edema Measurements - multi extremities 0 []  - Nutritional Assessment / Counseling / Intervention 0 []  - Lower Extremity Assessment (monofilament, tuning fork, pulses) 0 []  - Peripheral Arterial Disease Assessment (using hand held doppler) 0 ASSESSMENTS - Ostomy and/or Continence Assessment and Care []  - Incontinence Assessment and Management 0 []  - Ostomy Care Assessment and Management (repouching, etc.) 0 PROCESS - Coordination of Care X - Simple Patient / Family Education for ongoing care 1 15 []  - Complex (extensive) Patient / Family Education for ongoing care 0 X - Staff obtains Programmer, systems, Records, Test Results / Process Orders 1 10 []  - Staff telephones HHA, Nursing Homes / Clarify orders / etc 0 []  - Routine Transfer to another Facility (non-emergent condition) 0 []  - Routine Hospital Admission (non-emergent condition) 0 []  - New Admissions / Biomedical engineer / Ordering NPWT, Apligraf, etc. 0 []  - Emergency Hospital Admission (emergent condition) 0 X - Simple Discharge Coordination 1 10 []  - Complex (extensive) Discharge Coordination 0 PROCESS - Special Needs []  - Pediatric / Minor Patient Management 0 []  - Isolation Patient Management 0 []  - Hearing / Language / Visual special needs 0 []  - Assessment of Community assistance (transportation, D/C planning, etc.) 0 []  - Additional assistance / Altered mentation 0 []  - Support  Surface(s) Assessment (bed, cushion, seat, etc.) 0 INTERVENTIONS - Wound Cleansing / Measurement []  - Simple Wound Cleansing - one wound 0 X - Complex Wound Cleansing -  multiple wounds 3 5 X - Wound Imaging (photographs - any number of wounds) 1 5 []  - Wound Tracing (instead of photographs) 0 []  - Simple Wound Measurement - one wound 0 X - Complex Wound Measurement - multiple wounds 3 5 INTERVENTIONS - Wound Dressings X - Small Wound Dressing one or multiple wounds 2 10 []  - Medium Wound Dressing one or multiple wounds 0 []  - Large Wound Dressing one or multiple wounds 0 X - Application of Medications - topical 1 5 []  - Application of Medications - injection 0 INTERVENTIONS - Miscellaneous []  - External ear exam 0 []  - Specimen Collection (cultures, biopsies, blood, body fluids, etc.) 0 []  - Specimen(s) / Culture(s) sent or taken to Lab for analysis 0 []  - Patient Transfer (multiple staff / Civil Service fast streamer / Similar devices) 0 []  - Simple Staple / Suture removal (25 or less) 0 []  - Complex Staple / Suture removal (26 or more) 0 []  - Hypo / Hyperglycemic Management (close monitor of Blood Glucose) 0 []  - Ankle / Brachial Index (ABI) - do not check if billed separately 0 X - Vital Signs 1 5 Has the patient been seen at the hospital within the last three years: Yes Total Score: 130 Level Of Care: New/Established - Level 4 Electronic Signature(s) Signed: 04/22/2019 5:57:14 PM By: Levan Hurst RN, BSN Entered By: Levan Hurst on 04/22/2019 17:25:47 -------------------------------------------------------------------------------- Encounter Discharge Information Details Patient Name: Date of Service: [name] [name] Dakin. 04/22/2019 10:00 AM Medical Record [id number] Patient Account Number: [id number] Date of Birth/Sex: Treating RN: Oct 16, 1955 (64 y.o. [name] Primary Care Jasmine Maceachern: SYSTEM, PCP Other Clinician: Referring Schelly Chuba: Treating Silvester Reierson/Extender:Robson,  Dulcy Fanny, Fuller Plan in Treatment: 29 Encounter Discharge Information Items Discharge Condition: Stable Ambulatory Status: Wheelchair Discharge Destination: Home Transportation: Other Accompanied By: self Schedule Follow-up Appointment: Yes Clinical Summary of Care: Patient Declined Electronic Signature(s) Signed: 04/22/2019 5:21:05 PM By: Kela Millin Entered By: Kela Millin on 04/22/2019 11:31:56 -------------------------------------------------------------------------------- Multi Wound Chart Details Patient Name: Date of Service: [name] [name] Dakin. 04/22/2019 10:00 AM Medical Record [id number] Patient Account Number: [id number] Date of Birth/Sex: Treating RN: 04/23/1955 (64 y.o. [name] Primary Care Rowin Bayron: SYSTEM, PCP Other Clinician: Referring Kiana Hollar: Treating Windel Keziah/Extender:Robson, Dulcy Fanny, Fuller Plan in Treatment: 94 Vital Signs Height(in): 33 Pulse(bpm): 68 Weight(lbs): 260 Blood Pressure(mmHg): 112/52 Body Mass Index(BMI): 48 Temperature(F): 98.4 Respiratory 20 Rate(breaths/min): Photos: [3:No Photos] [4:No Photos] [7:No Photos] Wound Location: [3:Right Abdomen - Lower Quadrant] [4:Right Flank] [7:Right Flank - Posterior] Wounding Event: [3:Gradually Appeared] [4:Gradually Appeared] [7:Gradually Appeared] Primary Etiology: [3:Dehisced Wound] [4:MASD Moisture Associated Skin Damage] [7:Inflammatory] Comorbid History: [3:Cataracts, Lymphedema, Cataracts, Lymphedema, Cataracts, Lymphedema, Sleep Apnea, Hypertension, Sleep Apnea, Hypertension, Sleep Apnea, Hypertension, Type II Diabetes, End Stage Type II Diabetes, End Stage Type II Diabetes, End Stage  Renal Disease, Gout, Rheumatoid Arthritis, Neuropathy] [4:Renal Disease, Gout, Rheumatoid Arthritis, Neuropathy] [7:Renal Disease, Gout, Rheumatoid Arthritis, Neuropathy] Date Acquired: [3:12/07/2017] [4:05/31/2018] [7:04/17/2019] Weeks of Treatment: [3:70] [4:44]  [7:0] Wound Status: [3:Open] [4:Open] [7:Open] Clustered Wound: [3:No] [4:Yes] [7:No] Clustered Quantity: [3:N/A] [4:3] [7:N/A] Measurements L x W x D 7x2.1x0.1 [4:0.7x0.3x0.1] [7:0.7x0.3x0.1] (cm) Area (cm) : [3:11.545] [4:0.165] [7:0.165] Volume (cm) : [3:1.155] [4:0.016] [7:0.016] % Reduction in Area: [3:-78.20%] [4:98.00%] [7:0.00%] % Reduction in Volume: 80.20% [4:98.00%] [7:0.00%] Classification: [3:Full Thickness Without Exposed Support Structures] [4:N/A] [7:Full Thickness Without Exposed Support Structures] Exudate Amount: [3:Medium] [4:Medium] [7:Medium] Exudate Type: [3:Serosanguineous] [4:Serosanguineous] [7:Serosanguineous] Exudate Color: [3:red, brown] [4:red, brown] [7:red, brown] Wound Margin: [3:Distinct, outline attached Distinct, outline attached Flat  and Intact] Granulation Amount: [3:Large (67-100%)] [4:Large (67-100%)] [7:Large (67-100%)] Granulation Quality: [3:Red, Pink] [4:Pink] [7:Red] Necrotic Amount: [3:None Present (0%)] [4:None Present (0%)] [7:N/A] Exposed Structures: [3:Fat Layer (Subcutaneous Fat Layer (Subcutaneous Fat Layer (Subcutaneous Tissue) Exposed: Yes Fascia: No Tendon: No Muscle: No Joint: No Bone: No Small (1-33%)] [4:Tissue) Exposed: Yes Fascia: No Tendon: No Muscle: No Joint: No Bone: No Small (1-33%)]  [7:Tissue) Exposed: Yes Fascia: No Tendon: No Muscle: No Joint: No Bone: No None] Treatment Notes Electronic Signature(s) Signed: 04/22/2019 5:45:55 PM By: Linton Ham MD Signed: 04/22/2019 5:57:14 PM By: Levan Hurst RN, BSN Entered By: Linton Ham on 04/22/2019 11:11:56 -------------------------------------------------------------------------------- Multi-Disciplinary Care Plan Details Patient Name: Date of Service: [name] [name] Dakin. 04/22/2019 10:00 AM Medical Record [id number] Patient Account Number: [id number] Date of Birth/Sex: Treating RN: 1955-04-17 (65 y.o. [name] Primary Care Kent Braunschweig: SYSTEM, PCP Other  Clinician: Referring Bralynn Donado: Treating Amit Leece/Extender:Robson, Dulcy Fanny, Fuller Plan in Treatment: 94 Active Inactive Wound/Skin Impairment Nursing Diagnoses: Impaired tissue integrity Goals: Patient/caregiver will verbalize understanding of skin care regimen Date Initiated: 09/07/2017 Target Resolution Date: 05/24/2019 Goal Status: Active Ulcer/skin breakdown will have a volume reduction of 30% by week 4 Date Initiated: 06/29/2017 Date Inactivated: 08/04/2017 Target Resolution Date: 09/01/2017 Goal Status: Unmet Unmet Reason: larger Interventions: Assess patient/caregiver ability to obtain necessary supplies Assess patient/caregiver ability to perform ulcer/skin care regimen upon admission and as needed Assess ulceration(s) every visit Provide education on ulcer and skin care Notes: Electronic Signature(s) Signed: 04/22/2019 5:57:14 PM By: Levan Hurst RN, BSN Entered By: Levan Hurst on 04/22/2019 17:24:22 -------------------------------------------------------------------------------- Pain Assessment Details Patient Name: Date of Service: [name] [name] Dakin. 04/22/2019 10:00 AM Medical Record [id number] Patient Account Number: [id number] Date of Birth/Sex: Treating RN: 1955-10-14 (64 y.o. Orvan Falconer Primary Care Clytee Heinrich: SYSTEM, PCP Other Clinician: Referring Sibley Rolison: Treating Dailynn Nancarrow/Extender:Robson, Dulcy Fanny, Fuller Plan in Treatment: 94 Active Problems Location of Pain Severity and Description of Pain Patient Has Paino No Site Locations Pain Management and Medication Current Pain Management: Electronic Signature(s) Signed: 04/23/2019 9:14:02 AM By: Carlene Coria RN Entered By: Carlene Coria on 04/22/2019 10:28:13 -------------------------------------------------------------------------------- Patient/Caregiver Education Details Patient Name: Date of Service: Reinecke, Maeghan M. 3/1/2021andnbsp10:00 AM Medical Record [id number]  Patient Account Number: [id number] Date of Birth/Gender: Treating RN: [birth year] (64 y.o. [name] Primary Care Physician: SYSTEM, PCP Other Clinician: Referring Physician: Treating Physician/Extender:Robson, Dulcy Fanny, Fuller Plan in Treatment: 58 Education Assessment Education Provided To: Patient Education Topics Provided Wound/Skin Impairment: Methods: Explain/Verbal Responses: State content correctly Electronic Signature(s) Signed: 04/22/2019 5:57:14 PM By: Levan Hurst RN, BSN Entered By: Levan Hurst on 04/22/2019 17:24:31 -------------------------------------------------------------------------------- Wound Assessment Details Patient Name: Date of Service: [name] [name] Dakin. 04/22/2019 10:00 AM Medical Record [id number] Patient Account Number: [id number] Date of Birth/Sex: Treating RN: 04-22-1955 (64 y.o. [name] Primary Care Margrett Kalb: SYSTEM, PCP Other Clinician: Referring Mikita Lesmeister: Treating Oval Moralez/Extender:Robson, Dulcy Fanny, Fuller Plan in Treatment: 94 Wound Status Wound Number: 3 Primary Dehisced Wound Etiology: Wound Location: Right Abdomen - Lower Quadrant Wound Open Wounding Event: Gradually Appeared Status: Date Acquired: 12/07/2017 Comorbid Cataracts, Lymphedema, Sleep Apnea, Weeks Of Treatment: 70 History: Hypertension, Type II Diabetes, End Stage Clustered Wound: No Renal Disease, Gout, Rheumatoid Arthritis, Neuropathy Photos Wound Measurements Length: (cm) 7 % Reduct Width: (cm) 2.1 % Reduct Depth: (cm) 0.1 Epitheli Area: (cm) 11.545 Tunneli Volume: (cm) 1.155 Undermi Wound Description Full Thickness Without Exposed Support Foul Odo Classification: Structures Slough/F Wound Distinct, outline attached Margin: Exudate Medium Amount: Exudate Serosanguineous Type: Exudate red, brown Color:  Wound Bed Granulation Amount: Large (67-100%) Granulation Quality: Red, Pink Fascia E Necrotic  Amount: None Present (0%) Fat Laye Tendon E Muscle E Joint Ex Bone Exp r After Cleansing: No ibrino No Exposed Structure xposed: No r (Subcutaneous Tissue) Exposed: Yes xposed: No xposed: No posed: No osed: No ion in Area: -78.2% ion in Volume: 80.2% alization: Small (1-33%) ng: No ning: No Treatment Notes Wound #3 (Right Abdomen - Lower Quadrant) 1. Cleanse With Wound Cleanser 2. Periwound Care Skin Prep 3. Primary Dressing Applied Polymem Ag 4. Secondary Dressing ABD Pad Dry Gauze 5. Secured With Recruitment consultant) Signed: 04/25/2019 3:59:23 PM By: Mikeal Hawthorne EMT/HBOT Signed: 04/25/2019 5:39:26 PM By: Levan Hurst RN, BSN Previous Signature: 04/23/2019 9:14:02 AM Version By: Carlene Coria RN Entered By: Mikeal Hawthorne on 04/25/2019 12:57:16 -------------------------------------------------------------------------------- Wound Assessment Details Patient Name: Date of Service: [name] [name], HOLSONBACK 04/22/2019 10:00 AM Medical Record [id number] Patient Account Number: [id number] Date of Birth/Sex: Treating RN: 1955-08-10 (64 y.o. [name] Primary Care Bassy Fetterly: SYSTEM, PCP Other Clinician: Referring Tyr Franca: Treating Duffy Dantonio/Extender:Robson, Dulcy Fanny, Fuller Plan in Treatment: 94 Wound Status Wound Number: 4 Primary MASD Moisture Associated Skin Damage Etiology: Wound Location: Right Flank Wound Open Wounding Event: Gradually Appeared Status: Date Acquired: 05/31/2018 Comorbid Cataracts, Lymphedema, Sleep Apnea, Weeks Of Treatment: 44 History: Hypertension, Type II Diabetes, End Stage Clustered Wound: Yes Renal Disease, Gout, Rheumatoid Arthritis, Neuropathy Photos Wound Measurements Length: (cm) 0.7 Width: (cm) 0.3 Depth: (cm) 0.1 Clustered Quantity: 3 Area: (cm) 0.165 Volume: (cm) 0.016 Wound Description Wound Margin: Distinct, outline attached Exudate Amount: Medium Exudate Type: Serosanguineous Exudate  Color: red, brown Wound Bed Granulation Amount: Large (67-100%) Granulation Quality: Pink Necrotic Amount: None Present (0%) After Cleansing: No brino No Exposed Structure posed: No (Subcutaneous Tissue) Exposed: Yes posed: No posed: No osed: No sed: No % Reduction in Area: 98% % Reduction in Volume: 98% Epithelialization: Small (1-33%) Foul Odor Slough/Fi Fascia Ex Fat Layer Tendon Ex Muscle Ex Joint Exp Bone Expo Treatment Notes Wound #4 (Right Flank) 1. Cleanse With Wound Cleanser 2. Periwound Care Skin Prep 3. Primary Dressing Applied Calcium Alginate Ag 4. Secondary Dressing ABD Pad Drawtex 5. Secured With Recruitment consultant) Signed: 04/25/2019 3:59:23 PM By: Mikeal Hawthorne EMT/HBOT Signed: 04/25/2019 5:39:26 PM By: Levan Hurst RN, BSN Previous Signature: 04/23/2019 9:14:02 AM Version By: Carlene Coria RN Entered By: Mikeal Hawthorne on 04/25/2019 12:58:08 -------------------------------------------------------------------------------- Wound Assessment Details Patient Name: Date of Service: [name] [name], GILLEAN 04/22/2019 10:00 AM Medical Record [id number] Patient Account Number: [id number] Date of Birth/Sex: Treating RN: January 01, 1956 (64 y.o. [name] Primary Care Meline Russaw: SYSTEM, PCP Other Clinician: Referring Todd Jelinski: Treating Gissela Bloch/Extender:Robson, Dulcy Fanny, Fuller Plan in Treatment: 94 Wound Status Wound Number: 7 Primary Inflammatory Etiology: Wound Location: Right Flank - Proximal Wound Open Wounding Event: Gradually Appeared Status: Date Acquired: 04/17/2019 Comorbid Cataracts, Lymphedema, Sleep Apnea, Weeks Of Treatment: 0 History: Hypertension, Type II Diabetes, End Stage Clustered Wound: No Renal Disease, Gout, Rheumatoid Arthritis, Neuropathy Photos Wound Measurements Length: (cm) 0.7 % Reduct Width: (cm) 0.3 % Reduct Depth: (cm) 0.1 Epitheli Area: (cm) 0.165 Tunneli Volume: (cm) 0.016  Undermi Wound Description Full Thickness Without Exposed Support Foul Odo Classification: Structures Slough/F Wound Flat and Intact Margin: Exudate Medium Amount: Exudate Serosanguineous Type: Exudate red, brown Color: Wound Bed Granulation Amount: Large (67-100%) Granulation Quality: Red Fascia E Fat Laye Tendon E Muscle E Joint Ex Bone Exp r After Cleansing: No ibrino No Exposed Structure xposed: No r (Subcutaneous Tissue) Exposed: Yes  xposed: No xposed: No posed: No osed: No ion in Area: 0% ion in Volume: 0% alization: None ng: No ning: No Treatment Notes Wound #7 (Right, Proximal Flank) 1. Cleanse With Wound Cleanser 2. Periwound Care Skin Prep 3. Primary Dressing Applied Calcium Alginate Ag 4. Secondary Dressing ABD Pad Drawtex 5. Secured With Recruitment consultant) Signed: 04/25/2019 3:59:23 PM By: Mikeal Hawthorne EMT/HBOT Signed: 04/25/2019 5:39:26 PM By: Levan Hurst RN, BSN Previous Signature: 04/23/2019 9:14:02 AM Version By: Carlene Coria RN Entered By: Mikeal Hawthorne on 04/25/2019 12:57:40 -------------------------------------------------------------------------------- Vitals Details Patient Name: Date of Service: [name] [name] Dakin. 04/22/2019 10:00 AM Medical Record Number:[id number] Patient Account Number: [id number] Date of Birth/Sex: Treating RN: Aug 08, 1955 (64 y.o. Orvan Falconer Primary Care Takiesha Mcdevitt: SYSTEM, PCP Other Clinician: Referring Yovanny Coats: Treating Darlena Koval/Extender:Robson, Dulcy Fanny, Fuller Plan in Treatment: 94 Vital Signs Time Taken: 10:19 Temperature (F): 98.4 Height (in): 62 Pulse (bpm): 71 Weight (lbs): 260 Respiratory Rate (breaths/min): 20 Body Mass Index (BMI): 47.5 Blood Pressure (mmHg): 112/52 Reference Range: 80 - 120 mg / dl Electronic Signature(s) Signed: 04/23/2019 9:14:02 AM By: Carlene Coria RN Entered By: Carlene Coria on 04/22/2019 10:20:59

## 2019-05-06 ENCOUNTER — Other Ambulatory Visit: Payer: Self-pay

## 2019-05-06 ENCOUNTER — Encounter (HOSPITAL_COMMUNITY): Payer: Self-pay | Admitting: Emergency Medicine

## 2019-05-06 ENCOUNTER — Emergency Department (HOSPITAL_COMMUNITY)
Admission: EM | Admit: 2019-05-06 | Discharge: 2019-05-07 | Disposition: A | Discharge status: Home / Self Care | Payer: No Typology Code available for payment source | Attending: Emergency Medicine | Admitting: Emergency Medicine

## 2019-05-06 ENCOUNTER — Emergency Department (HOSPITAL_COMMUNITY): Payer: No Typology Code available for payment source

## 2019-05-06 DIAGNOSIS — R2243 Localized swelling, mass and lump, lower limb, bilateral: Secondary | ICD-10-CM | POA: Diagnosis present

## 2019-05-06 DIAGNOSIS — Z7984 Long term (current) use of oral hypoglycemic drugs: Secondary | ICD-10-CM | POA: Diagnosis not present

## 2019-05-06 DIAGNOSIS — R600 Localized edema: Secondary | ICD-10-CM | POA: Diagnosis not present

## 2019-05-06 DIAGNOSIS — Z87891 Personal history of nicotine dependence: Secondary | ICD-10-CM | POA: Insufficient documentation

## 2019-05-06 DIAGNOSIS — N184 Chronic kidney disease, stage 4 (severe): Secondary | ICD-10-CM | POA: Insufficient documentation

## 2019-05-06 DIAGNOSIS — R609 Edema, unspecified: Secondary | ICD-10-CM

## 2019-05-06 DIAGNOSIS — E1122 Type 2 diabetes mellitus with diabetic chronic kidney disease: Secondary | ICD-10-CM | POA: Insufficient documentation

## 2019-05-06 DIAGNOSIS — Z79899 Other long term (current) drug therapy: Secondary | ICD-10-CM | POA: Diagnosis not present

## 2019-05-06 DIAGNOSIS — I129 Hypertensive chronic kidney disease with stage 1 through stage 4 chronic kidney disease, or unspecified chronic kidney disease: Secondary | ICD-10-CM | POA: Diagnosis not present

## 2019-05-06 LAB — CBC
HCT: 34.2 % — ABNORMAL LOW (ref 36.0–46.0)
Hemoglobin: 10.3 g/dL — ABNORMAL LOW (ref 12.0–15.0)
MCH: 25.2 pg — ABNORMAL LOW (ref 26.0–34.0)
MCHC: 30.1 g/dL (ref 30.0–36.0)
MCV: 83.8 fL (ref 80.0–100.0)
Platelets: 468 10*3/uL — ABNORMAL HIGH (ref 150–400)
RBC: 4.08 MIL/uL (ref 3.87–5.11)
RDW: 18 % — ABNORMAL HIGH (ref 11.5–15.5)
WBC: 5.7 10*3/uL (ref 4.0–10.5)
nRBC: 0 % (ref 0.0–0.2)

## 2019-05-06 LAB — COMPREHENSIVE METABOLIC PANEL
ALT: 12 U/L (ref 0–44)
AST: 33 U/L (ref 15–41)
Albumin: 3.3 g/dL — ABNORMAL LOW (ref 3.5–5.0)
Alkaline Phosphatase: 94 U/L (ref 38–126)
Anion gap: 12 (ref 5–15)
BUN: 49 mg/dL — ABNORMAL HIGH (ref 8–23)
CO2: 30 mmol/L (ref 22–32)
Calcium: 9.3 mg/dL (ref 8.9–10.3)
Chloride: 101 mmol/L (ref 98–111)
Creatinine, Ser: 1.93 mg/dL — ABNORMAL HIGH (ref 0.44–1.00)
GFR calc Af Amer: 31 mL/min — ABNORMAL LOW (ref >60)
GFR calc non Af Amer: 27 mL/min — ABNORMAL LOW (ref >60)
Glucose, Bld: 137 mg/dL — ABNORMAL HIGH (ref 70–99)
Potassium: 4.9 mmol/L (ref 3.5–5.1)
Sodium: 143 mmol/L (ref 135–145)
Total Bilirubin: 1.2 mg/dL (ref 0.3–1.2)
Total Protein: 7.1 g/dL (ref 6.5–8.1)

## 2019-05-06 LAB — TROPONIN I (HIGH SENSITIVITY): Troponin I (High Sensitivity): 4 ng/L (ref <18)

## 2019-05-06 LAB — MAGNESIUM: Magnesium: 2 mg/dL (ref 1.7–2.4)

## 2019-05-06 LAB — BRAIN NATRIURETIC PEPTIDE: B Natriuretic Peptide: 258.1 pg/mL — ABNORMAL HIGH (ref 0.0–100.0)

## 2019-05-06 MED ORDER — FUROSEMIDE 10 MG/ML IJ SOLN
40.0000 mg | Freq: Once | INTRAMUSCULAR | Status: DC
  Filled 2019-05-06: qty 4

## 2019-05-06 MED ORDER — POTASSIUM CHLORIDE CRYS ER 20 MEQ PO TBCR
20.0000 meq | EXTENDED_RELEASE_TABLET | Freq: Once | ORAL | Status: AC
  Administered 2019-05-06: 20 meq via ORAL
  Filled 2019-05-06: qty 1

## 2019-05-06 MED ORDER — FUROSEMIDE 40 MG PO TABS
40.0000 mg | ORAL_TABLET | Freq: Once | ORAL | Status: AC
  Administered 2019-05-06: 40 mg via ORAL
  Filled 2019-05-06: qty 1
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
## 2019-05-06 NOTE — ED Notes (Signed)
[facility] called for transport.  

## 2019-05-06 NOTE — ED Triage Notes (Signed)
Per EMS, patient from home, patient reports noncompliance with Lasix. States she has difficulty ambulating to restroom. C/o ongoing generalized edema. Reports pain all over. Hx CHF.

## 2019-05-06 NOTE — Progress Notes (Signed)
Consult for IV access placed.Patient has limited movement of upper arms. Left arm worse than right. Ultrasound used to scan both upper extremities. No cephalic, brachial or basilic veins viewed in RUE.  Left arm unable to abduct from body, so scan was limited. Nonetheless, no cephalic, brachial or basilic veins viewed. Results given to primary RN. IV access deferred to primary physician.

## 2019-05-06 NOTE — Discharge Instructions (Addendum)
See your Physician for recheck.  Take your lasix every morning

## 2019-05-23 ENCOUNTER — Encounter (HOSPITAL_BASED_OUTPATIENT_CLINIC_OR_DEPARTMENT_OTHER): Payer: Medicare Other | Admitting: Internal Medicine

## 2019-05-30 ENCOUNTER — Other Ambulatory Visit: Payer: Self-pay

## 2019-05-30 ENCOUNTER — Encounter (HOSPITAL_BASED_OUTPATIENT_CLINIC_OR_DEPARTMENT_OTHER): Payer: Medicare Other | Attending: Internal Medicine | Admitting: Internal Medicine

## 2019-05-30 DIAGNOSIS — I890 Lymphedema, not elsewhere classified: Secondary | ICD-10-CM | POA: Insufficient documentation

## 2019-05-30 DIAGNOSIS — E11649 Type 2 diabetes mellitus with hypoglycemia without coma: Secondary | ICD-10-CM | POA: Diagnosis not present

## 2019-05-30 DIAGNOSIS — L98498 Non-pressure chronic ulcer of skin of other sites with other specified severity: Secondary | ICD-10-CM | POA: Insufficient documentation

## 2019-05-30 DIAGNOSIS — B354 Tinea corporis: Secondary | ICD-10-CM | POA: Diagnosis not present

## 2019-05-30 DIAGNOSIS — E1122 Type 2 diabetes mellitus with diabetic chronic kidney disease: Secondary | ICD-10-CM | POA: Insufficient documentation

## 2019-05-30 DIAGNOSIS — N184 Chronic kidney disease, stage 4 (severe): Secondary | ICD-10-CM | POA: Diagnosis not present

## 2019-05-30 DIAGNOSIS — I129 Hypertensive chronic kidney disease with stage 1 through stage 4 chronic kidney disease, or unspecified chronic kidney disease: Secondary | ICD-10-CM | POA: Insufficient documentation

## 2019-05-30 DIAGNOSIS — S31104D Unspecified open wound of abdominal wall, left lower quadrant without penetration into peritoneal cavity, subsequent encounter: Secondary | ICD-10-CM | POA: Insufficient documentation

## 2019-05-30 DIAGNOSIS — T8131XD Disruption of external operation (surgical) wound, not elsewhere classified, subsequent encounter: Secondary | ICD-10-CM | POA: Diagnosis present

## 2019-05-30 DIAGNOSIS — Z6841 Body Mass Index (BMI) 40.0 and over, adult: Secondary | ICD-10-CM | POA: Insufficient documentation

## 2019-05-30 NOTE — Progress Notes (Signed)
[name] [name], [name] [name] ([id number]) Visit Report for 05/30/2019 HPI Details Patient Name: Date of Service: [name] [name], [name] [name] 05/30/2019 10:00 AM Medical Record Number:[id number] Patient Account Number: [id number] Date of Birth/Sex: Treating RN: 1955-04-25 (64 y.o. [name] Primary Care Provider: SYSTEM, PCP Other Clinician: Referring Provider: Treating Provider/Extender:Aryssa Rosamond, Dulcy Fanny, Fuller Plan in Treatment: 100 History of Present Illness HPI Description: 06/29/17; this is a 64 year old woman who is a poor historian. It appears that she's had limited medical care over the last 2 years or so and what she had his through the [place]. She is here for review of a draining area on her right shoulder anteriorly as well as a nonhealing surgical wound in the left lower abdomen. Per her description she had a hysterectomy in 2006 4 "tumors". About 9 months later she had have another operation in the same area because of "tumors" involving her fallopian tubes and ovary. Sounds as though the surgical wound in this area that took a long time to heal. Apparently the area on her left lower abdomen is been open for the last 6 or 7 months. She is been applying topical antibiotics. 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 She had a fall in late 2018 and the open area on top of her shoulder. She is had draining pus coming out of this wound since she's been applying topical antibiotics and a Band-Aid. She is not complaining of a lot of pain or fever although she did have chills today. She saw her doctor at the [place] yesterday she doesn't know her name she apparently was referred to a surgeon at the [facility] and [place] presumably an orthopedic surgeon to look at that shoulder. She also has a primary doctor in [place] by the name of Dr. Jimmye Norman who referred her here after seeing her within the last month. 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 We don't have a lot of information in her in Epic. Last hospitalization was from 11/21/16 through 11/24/16 with hypoglycemia felt to be secondary to sulfonylureas. There is no mention of a wound at that time. 07/07/17; patient's shoulder cultured MSSA. I put her on clindamycin to had some anaerobic coverage with caution about diarrhea. I had asked her to make an orthopedic follow-up at the [place] with a doctor that made the original surgery but she has not done that yet. She also has a fairly large wound in the left lower abdomen in the middle of his surgical scar. This was apparently gynecologic surgery perhaps fibroid surgery. In any case these wounds are larger today. 07/21/17; the patient went to see the orthopedic doctor at the [facility] who did her original surgery surgery. Per the patient's description the draining area had closed down. He didn't think anything needed to be done there were no imaging studies. The lower abdominal wound in the left mid pelvis area also looks somewhat better to me. There is less surface material on this still some probing areas but not a lot of depth. We've been using silver alginate 08/03/17; the patient arrives back in 2 week follow-up. The draining sinus on her right shoulder has not reopened although she is complaining of widespread shoulder and other joint pain. She is using silver alginate to the remaining surgical wound on her abdomen. 08/18/17; the patient has 2 week follow-up appointment. The draining sinus on her right shoulder is not reopened.  She is complaining of left shoulder pain today. The presumed surgical wound on her lower abdomen looks quite a bit better and this is really contracted quite nicely. When she first came in the deep sinus she had on the anterior shoulder cultured MSSA. I gave her clindamycin. I arranged for orthopedic follow-up at the [facility] however  this is already closed nevertheless this was a deep probing tunnel and I think it needs to be watched for reopening. 09/07/17; surgical wound on her lower abdomen. This as 2 attached leaflets. The larger one is superiorly. A small probing area here with some sanguinous drainage. Surface needed debridement today. We've been using silver alginate however I changed this to silver collagen today 09/15/17. Surgical wound on her lower abdomen. No recent improvement although this is markedly improved from when we first started seeing her although the original problem was a wound on her right shoulder which is long since closed.switched her to Silver collagen last week She arrives in clinic today complaining of abdominal pain "so bad I almost went to the ER". She tells me she had an endoscopy and colonoscopy about a week ago. This was at the [place], I have no way to look at this. She is unaware of any biopsy was done 10/02/17 on evaluation today patient continues to do well in regard to her abdominal wound were using Prisma it's obviously taken some time for this to heal but nonetheless I do think that she is making good progress currently. There does not appear to be any evidence of infection at this time which is good news. She still does have some tunneling or undermining but again I think this is still doing well. 10/19/17; she continues to make good progress in this surgical abdominal wound using Prisma. This is come in nicely at one point had considerable depth. No evidence of infection. The patient is not complaining of pain in this area but is complaining of diabetic neuropathy pain in her right foot 11/02/17;the patient's wound is superficial and is really continued to make decent progress. I thought this would be more problematic when I first saw however this appears to a filled in nicely there is no depth. It exists in the crevice between what I think is lower abdominal hernias. She comes in  complaining of generalized pain however she is running out of her narcotics providedto her by a local pain clinic. She is asking for home health however she is [place] as I understand we cannot order this. 11/16/2017; 2-week follow-up. She has a lower abdominal surgical wound and this appears to be making nice progress closing in nicely each time we see it. This was initially a deep crevice surgical area between what I think is to lower abdominal hernias. In spite of this wound has healthy granulation. There does not appear to be any depth at all. There is probably a lot of tensile stress on the wound area however in spite of this things actually look better 11/30/2017; 2-week follow-up. She is complaining of left lower quadrant abdominal pain it is difficult to characterize this clearly episodic and not really associated with nausea or vomiting. She follows at the [place]. Tells me she was recently seen there by her new primary physician. The wound is in the lower abdomen in the middle of what I think was a gynecologic surgical site. We have made progress with this. 12/14/2017; 2-week follow-up. The patient comes in today with an improved wound area on the lower mid abdomen surgical  scar which was the wound we have been following her for. Paradoxically she has developed a new open area just to the right of the midline almost a mirror imaging wound on the right. She states this came up about a week ago. She does not have a clear explanation. Both of these wounds are in surgical scar areas although from her I think this surgery was remote. She denies scratching this or traumatizing it. It is surrounded by 2 large areas that I suspect are herniations but I still cannot see how this would have come from trauma. 12/28/2017; the area on the left surgical scar in the lower abdomen is better however the area on the right that we identified last time is worse. Increase in dimensions. There is no obvious infection  here. We have been using silver alginate. She tells me she has been to the ER at the [place] and they gave her doxycycline she also saw her primary doctor yesterday. I think she needs to be referred to dermatology consideration of a skin biopsy question panniculitis 01/11/2018; we continue to have improvement in the left lower mid abdominal wound however the area on the right that we identified in late October has gotten quite a bit worse. There is not appear to be surrounding infection although the patient is complaining of a lot of pain. We have been using silver alginate. [facility] I think this should help 01/24/18 on evaluation today patient actually apparently has two abdominal wounds the one on the right is new here and unfortunately seems to be doing a little bit worse and that it's a little bit larger. Fortunately there does not appear to be any sign of infection at this time. No fevers, chills, nausea, or vomiting noted at this time. Overall again it sounds like this is a fairly complex issue and despite the fact that we been using silver nitrate in the [facility] Dressing still has some hyper granulation. No fevers, chills, nausea, or vomiting noted at this time. 01/31/18 plan evaluation today patient appears to be doing better in general in regard to her wounds. She has been tolerating the dressing changes without complication. I do feel like that right now she's making some progress so she still is having some discomfort. 02/07/18 on evaluation today patient actually appears to be doing rather well in regard to her double ulcers as far as I'm concerned. With that being said she is somewhat frustrated in general in regard to the overall appearance of everything. I feel like this is unwarranted however since she seems to be making progress. 02/15/2018; lower mid abdomen right much greater than left although the left was the original  wound. The area on the left looks really quite good and is smaller. The area on the right seems longer and measures worse in terms of dimensions. These areas are in the most dependent part of her large abdominal pannus and this may be the entire issue. at [facility] on the left there is lymphedema around the wound. The patient is seeing dermatology next week apparently in [facility] 03/01/2017; she likely had abdomen. Right now he other than left ankle both he has better today. I think both of these areas are smaller. Surface looks healthier she is due to see dermatology later this month. Using [facility] 1/23; abdominal wounds in the lower mid abdomen. This is in a very dependent area in her lower abdominal pannus and the simple  tension in this area may have a lot to do with this. She also has constant chronic pruritus and scratching and I wonder if this has something to do with open wounds in this area. She tells me her daughter is helping her with the dressings currently and the original wound on the left side of her mid abdomen is better almost closed the larger area on the right also looks some better. She sees a dermatologist next week I have told her to ask the dermatologist for help with generalized pruritus. The probable skin wounds that she has all over secondary to scratching. The wounds we have been treating may be related to simple tension in a dependent lymphedematous area. She could benefit from an abdominal binder to help support this area 2/13; abdominal wounds in the lower mid abdomen. Very dependent in her abdomen and this may be secondary to tension in the lower part of her abdominal pannus plus or minus edema. 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 She claims not to be scratching at the wound areas. 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 They give her gentian violet to put on the wound area but this is already present and Hydrofera Blue. The original wound on the left is healed today. However the area on the right part of her mid abdominal pannus appears larger. We are going to try to work with her on getting an abdominal binder to help support the weight of the pannus around the wounds 3/9; abdominal wound in the lower mid abdomen. She apparently has an abdominal binder that she bought on [employer]. She is using Hydrofera Blue to the wound on the right of the midline. The original wound was on the left and is closed over. 3/26; abdominal wound in the lower mid abdomen. This is her second wound in this area. The lower part of her abdominal pannus surrounding surgical scar. Once again she is not wearing the abdominal binder she said she obtained. The dressing is Hydrofera Blue 4/16; abdomen in the right lower mid abdomen. This is the second wound in this area the other was a mirror image area on the left. I had her seen by dermatology at the [facility] this was not very helpful. She is not wearing her abdominal binder. We have been using Hydrofera Blue which is what healed at the other side. The abdominal wound is actually larger She arrives in clinic today complaining of the plantar right heel. Very tender. She has been pushing herself up in her bed with her heels and this may be a deep tissue injury. 4/23; abdomen wound is about the same. Spreading along the folds of her pannus somewhat but generally the surface looks somewhat better. She is not using her abdominal binder although she did bring it in this time She arrives in clinic complaining of pain and itching in the right lateral rib area. She has 3 open areas here which are circular wounds. These are new She also mentions her lower sacrum and there are 2 wounds in this area. These are new 5/7;  abdominal wound is about the same although in general it looks better than a few weeks ago at which time it had punched out depth. No debridement is required. She will not wear her abdominal binder  She has areas on her right lateral rib cage 3 open areas circular wounds these look about the same as last time. I am assuming these have something to do with the prurigo nodularis that she may have scratched but I am not certain. Finally she has a linear area on her lower sacrum which I am assuming is a pressure area 5/21-Patient returns at 2 weeks for abdominal wound, areas on the right lateral rib cage, and lower sacrum linear area which is a pressure sore. We have been using silver alginate to all these wounds. She has been asked to wear abdominal binder which she is not. she has 2 new wounds on right flank area and on her coccyx. she has a total of 6 wounds 6/9; I have been following this patient for a prolonged period of time for wounds on her lower mid abdomen which I think were secondary to severe lymphedema. She had an area on the left of the midline that eventually healed only to open on the right side. This is still open we have been using silver alginate. She will not use an abdominal binder. About a month ago she arrived here with open areas on the right lateral ribs. This is in the middle of a large amount of soft tissue/pannus fold. She has 4 open areas here at this time. Finally she has 2 open areas in her lower coccyx. We have been using silver alginate on all the wound areas 6/25; we are following this woman for wounds on her lower mid abdomen in the setting of a pannus and severe dependent lymphedema. More recently she has had areas in the folds on the right lateral rib area. Culture I did of this area last time was negative. Finally she has an area on the coccyx. We have been using silver alginate to all the wound areas 7/9; this is a patient with morbid obesity severe lymphedema in  her lower abdominal pannus/severe dependent abdominal lymphedema. She first came to this clinic with an area on the left lower central abdomen. This eventually healed over. About the same time as it did heal over she developed an area in close juxtaposition on the other side of the midline. We have been following this for some months when she was here last time I thought things were improving although she a lot arrives today with a larger wound. She will not use her external abdominal binder. Over the last several weeks she has had areas on her right lateral rib area. The exact etiology of this is unclear we have been using silver alginate. She arrives today with the entire area inflamed angry with loss of surface epithelium and marked pruritus. I think this is tinea/Candida. This could be secondary or the primary in the etiology of these expanding areas. 7/23; the area on the right lateral rib areas is quite a bit better. Skin that we treated with oral and topical antifungals looks a lot better around this area. Unfortunately the lower abdominal area actually measures larger. There is no drainage here really no surrounding erythema. 8/6-Patient arrives to the clinic at 3 weeks, the right lateral rib cage area has copious drainage but the wounds look a little better, the abdominal midline wound unfortunately is larger, she is not able to use the binder both wounds have a clean base 8/20. Since the patient was last here she was hospitalized from 813 through 818. This was predominantly because of hyperkalemia secondary to lisinopril and dehydration. Lisinopril was  discontinued at discharge. She was also felt to have mild cellulitis around several skin ulcers she was given antibiotics in the hospital but not discharged on antibiotics. Also noted that she has stage IV chronic kidney disease with a baseline creatinine at 1.7. 9/3 the patient still has a 2 wound areas lower mid abdominal pannus and the  right lateral ribs. We have been using polymen to the abdominal wound and silver alginate to the area on the folds on her right lateral ribs. She is complaining about pruritus around the areas on the ribs. 9/17; 2-week follow-up. The area on the mid lower abdomen better using PolyMem. She has 4 small areas in the pannus fold on the right lateral ribs and is a new area on the left lateral lower abdominal folds this week. 10/1; 2-week follow-up. She has been using PolyMem on the mid lower abdomen and really not much improvement. She has this for small areas in the pannus fold of the right lateral ribs. These are gradually I think improving. The area that was new last time on the left lower abdominal pannus has closed over. 10/15; 2-week follow-up. I changed to silver collagen on the mid lower abdomen wound which seems smaller today. She is still using silver alginate on the right lateral ribs and a pannus fold and this looks better today although she is complaining of itching in this area She has had an outbreak on her face which almost looks like cystic acne. She is going to see her primary doctor about this tomorrow 11/5; silver collagen on the mid lower abdomen which seems to be doing a good job we are using silver alginate in the pannus folds on her right lateral ribs. She is complaining of pruritus in this area which is probably intertrigo/Candida. I will prescribe nystatin powder 12/3. We are using silver collagen to her mid lower abdomen which looks better. The pannus area on the right lateral ribs we are using silver alginate she is pointing nystatin powder in here. The wounds look better. She is separating the folds with hand towels. We have suggested Interdry and she is going to look into getting that online 12/28; 1 month follow-up. In general the area on her mid lower abdomen looks a lot better. Surface area is smaller we have been using silver collagen. The pannus area on the right  lateral ribs we have been using alginate. She also is putting Interdry in these areas. 1/25; 1 month follow-up. The area on her abdomen is about the same. We have been using silver collagen here. The areas on her right thorax and the pannus folds in this area are down to 3 small wounds we have been using silver alginate here 3/1; the area on her pannus of the lower abdomen on the right of midline is about the same. We have been using silver collagen for a protracted period and previously we have used Hydrofera Blue. The area is in a pannus fold in her right lateral thorax actually are smaller. There are 3 wounds in this area. She is using calcium alginate here. She is complaining of increasing ankle edema she saw her primary doctor at the [place] in [place] and had her Lasix increased otherwise she has no additional new complaint 4/8; the area on the pannus of her lower abdomen looks somewhat better. We have been using polymen. The area in the pannus in her right lateral thorax are down to 2 small open areas. We have been using calcium alginate  Electronic Signature(s) Signed: 05/30/2019 5:58:56 PM By: Linton Ham MD Entered By: Linton Ham on 05/30/2019 11:52:08 -------------------------------------------------------------------------------- Physical Exam Details Patient Name: Date of Service: [name] [name], [name] [name] 05/30/2019 10:00 AM Medical Record [id number] Patient Account Number: [id number] Date of Birth/Sex: Treating RN: 1955-08-12 (64 y.o. [name] Primary Care Provider: SYSTEM, PCP Other Clinician: Referring Provider: Treating Provider/Extender:[place]oir, Dulcy Fanny, Fuller Plan in Treatment: 100 Constitutional Sitting or standing Blood Pressure is within target range for patient.. Pulse regular and within target range for patient.Marland Kitchen Respirations regular, non-labored and within target range.. Temperature is normal and within the target range for the patient.Marland Kitchen  Appears in no distress. Gastrointestinal (GI) Markedly obese nontender. Notes Wound exam; midline lower abdominal wall just to the right of midline. I think this looks somewhat better. Tissue looks healthy. Certainly no debridement is necessary there is no surrounding tenderness or evidence of infection. Electronic Signature(s) Signed: 05/30/2019 5:58:56 PM By: Linton Ham MD Entered By: Linton Ham on 05/30/2019 11:53:50 -------------------------------------------------------------------------------- Physician Orders Details Patient Name: Date of Service: [name] [name], [name] [name] 05/30/2019 10:00 AM Medical Record Number:[id number] Patient Account Number: [id number] Date of Birth/Sex: Treating RN: 12-24-1955 (64 y.o. 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 Dressing Change Frequency Other: - all wounds - change dressing twice a week. Skin Barriers/Peri-Wound Care Skin Prep Wound Cleansing May shower and wash wound with soap and water. Primary Wound Dressing Wound #3 Right Abdomen - Lower Quadrant Polymem Silver Wound #4 Right Flank Calcium Alginate Wound #7 Right,Proximal Flank Calcium Alginate Secondary Dressing Wound #3 Right Abdomen - Lower Quadrant Dry Gauze ABD pad - secure with tape Wound #4 Right Flank ABD  pad Drawtex Wound #7 Right,Proximal Flank ABD pad Drawtex Off-Loading Turn and reposition every 2 hours Other: - abdominal binder to support abdomen. Patient to float right heel while in chair or bed with pillow. Patient to keep pressure off right heel as much as possible. Roll up a face cloth or a pillowcase to separate all skin folds- under breast, abdomen, both flank areas. Additional Orders / Instructions Other: - ***[facility] with [facility] INTERDRY 10"x 12 feet roll or patient to purchase offline from [employer].*** [place] [facility] for wound care. - [facility] Electronic Signature(s) Signed: 05/30/2019 5:58:56 PM By: Linton Ham MD Signed: 05/30/2019 6:08:37 PM By: Deon Pilling Entered By: Deon Pilling on 05/30/2019 11:15:52 -------------------------------------------------------------------------------- Problem List Details Patient Name: Date of Service: [name] [name]. 05/30/2019 10:00 AM Medical Record [id number] Patient Account Number: [id number] Date of Birth/Sex: Treating RN: [birth year] (64 y.o. [name] Primary Care Provider: Other Clinician: SYSTEM, PCP Referring Provider: Treating Provider/Extender:Roddrick Sharron, Dulcy Fanny, Fuller Plan in Treatment: 100 Active Problems ICD-10 Evaluated Encounter Code Description Active Date Today Diagnosis T81.31XD Disruption of external operation (surgical) wound, not 06/29/2017 No Yes elsewhere classified, subsequent encounter S31.104D Unspecified open wound of abdominal wall, left lower 06/29/2017 No Yes quadrant without penetration into peritoneal cavity, subsequent encounter L98.498 Non-pressure chronic ulcer of skin of other sites with 06/14/2018 No  Yes other specified severity I89.0 Lymphedema, not elsewhere classified 08/30/2018 No Yes B35.4 Tinea corporis 08/30/2018 No Yes Inactive Problems ICD-10 Code Description Active Date Inactive Date M71.011 Abscess of  bursa, right shoulder 06/29/2017 06/29/2017 L89.616 Pressure-induced deep tissue damage of right heel 06/07/2018 06/07/2018 L89.150 Pressure ulcer of sacral region, unstageable 06/14/2018 06/14/2018 Resolved Problems Electronic Signature(s) Signed: 05/30/2019 5:58:56 PM By: Linton Ham MD Entered By: Linton Ham on 05/30/2019 11:51:25 -------------------------------------------------------------------------------- Progress Note Details Patient Name: Date of Service: [name] [name]. 05/30/2019 10:00 AM Medical Record [id number] Patient Account Number: [id number] Date of Birth/Sex: Treating RN: 1955/10/04 (64 y.o. [name] Primary Care Provider: SYSTEM, PCP Other Clinician: Referring Provider: Treating Provider/Extender:Cyniah Gossard, Dulcy Fanny, Fuller Plan in Treatment: 100 Subjective History of Present Illness (HPI) 06/29/17; this is a 64 year old woman who is a poor historian. It appears that she's had limited medical care over the last 2 years or so and what she had his through the [place]. She is here for review of a draining area on her right shoulder anteriorly as well as a nonhealing surgical wound in the left lower abdomen. Per her description she had a hysterectomy in 2006 4 "tumors". About 9 months later she had have another operation in the same area because of "tumors" involving her fallopian tubes and ovary. Sounds as though the surgical wound in this area that took a long time to heal. Apparently the area on her left lower abdomen is been open for the last 6 or 7 months. She is been applying topical antibiotics. 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 She had a fall in late 2018 and the open area on top of her shoulder. She is had draining pus coming out of this wound since she's been applying topical antibiotics and a Band-Aid. She is not complaining of a lot of pain or fever  although she did have chills today. She saw her doctor at the [place] yesterday she doesn't know her name she apparently was referred to a surgeon at the [facility] and [place] presumably an orthopedic surgeon to look at that shoulder. She also has a primary doctor in [place] by the name of Dr. Jimmye Norman who referred her here after seeing her within the last month. 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 We don't have a lot of information in her in Epic. Last hospitalization was from 11/21/16 through 11/24/16 with hypoglycemia felt to be secondary to sulfonylureas. There is no mention of a wound at that time. 07/07/17; patient's shoulder cultured MSSA. I put her on clindamycin to had some anaerobic coverage with caution about diarrhea. I had asked her to make an orthopedic follow-up at the [place] with a doctor that made the original surgery but she has not done that yet. ooShe also has a fairly large wound in the left lower abdomen in the middle of his surgical scar. This was apparently gynecologic surgery perhaps fibroid surgery. In any case these wounds are larger today. 07/21/17; the patient went to see the orthopedic doctor at the [facility] who did her original surgery surgery. Per the patient's description the draining area had closed down. He didn't think anything needed to be done there were no imaging studies. ooThe lower abdominal wound in the left mid pelvis area also looks somewhat better to me. There is less surface material on this still some probing areas but not a lot  of depth. We've been using silver alginate 08/03/17; the patient arrives back in 2 week follow-up. The draining sinus on her right shoulder has not reopened although she is complaining of widespread shoulder and other joint pain. She is using silver alginate to the remaining surgical wound on her abdomen. 08/18/17; the patient has 2 week follow-up appointment. The draining sinus  on her right shoulder is not reopened. She is complaining of left shoulder pain today. The presumed surgical wound on her lower abdomen looks quite a bit better and this is really contracted quite nicely. When she first came in the deep sinus she had on the anterior shoulder cultured MSSA. I gave her clindamycin. I arranged for orthopedic follow-up at the [facility] however this is already closed nevertheless this was a deep probing tunnel and I think it needs to be watched for reopening. 09/07/17; surgical wound on her lower abdomen. This as 2 attached leaflets. The larger one is superiorly. A small probing area here with some sanguinous drainage. Surface needed debridement today. We've been using silver alginate however I changed this to silver collagen today 09/15/17. Surgical wound on her lower abdomen. No recent improvement although this is markedly improved from when we first started seeing her although the original problem was a wound on her right shoulder which is long since closed.switched her to Silver collagen last week She arrives in clinic today complaining of abdominal pain "so bad I almost went to the ER". She tells me she had an endoscopy and colonoscopy about a week ago. This was at the [place], I have no way to look at this. She is unaware of any biopsy was done 10/02/17 on evaluation today patient continues to do well in regard to her abdominal wound were using Prisma it's obviously taken some time for this to heal but nonetheless I do think that she is making good progress currently. There does not appear to be any evidence of infection at this time which is good news. She still does have some tunneling or undermining but again I think this is still doing well. 10/19/17; she continues to make good progress in this surgical abdominal wound using Prisma. This is come in nicely at one point had considerable depth. No evidence of infection. The patient is not complaining of pain in this area but  is complaining of diabetic neuropathy pain in her right foot 11/02/17;the patient's wound is superficial and is really continued to make decent progress. I thought this would be more problematic when I first saw however this appears to a filled in nicely there is no depth. It exists in the crevice between what I think is lower abdominal hernias. She comes in complaining of generalized pain however she is running out of her narcotics providedto her by a local pain clinic. She is asking for home health however she is [place] as I understand we cannot order this. 11/16/2017; 2-week follow-up. She has a lower abdominal surgical wound and this appears to be making nice progress closing in nicely each time we see it. This was initially a deep crevice surgical area between what I think is to lower abdominal hernias. In spite of this wound has healthy granulation. There does not appear to be any depth at all. There is probably a lot of tensile stress on the wound area however in spite of this things actually look better 11/30/2017; 2-week follow-up. She is complaining of left lower quadrant abdominal pain it is difficult to characterize this clearly episodic and  not really associated with nausea or vomiting. She follows at the [place]. Tells me she was recently seen there by her new primary physician. The wound is in the lower abdomen in the middle of what I think was a gynecologic surgical site. We have made progress with this. 12/14/2017; 2-week follow-up. The patient comes in today with an improved wound area on the lower mid abdomen surgical scar which was the wound we have been following her for. Paradoxically she has developed a new open area just to the right of the midline almost a mirror imaging wound on the right. She states this came up about a week ago. She does not have a clear explanation. Both of these wounds are in surgical scar areas although from her I think this surgery was remote. She denies  scratching this or traumatizing it. It is surrounded by 2 large areas that I suspect are herniations but I still cannot see how this would have come from trauma. 12/28/2017; the area on the left surgical scar in the lower abdomen is better however the area on the right that we identified last time is worse. Increase in dimensions. There is no obvious infection here. We have been using silver alginate. She tells me she has been to the ER at the [place] and they gave her doxycycline she also saw her primary doctor yesterday. I think she needs to be referred to dermatology consideration of a skin biopsy question panniculitis 01/11/2018; we continue to have improvement in the left lower mid abdominal wound however the area on the right that we identified in late October has gotten quite a bit worse. There is not appear to be surrounding infection although the patient is complaining of a lot of pain. We have been using silver alginate. [facility] I think this should help 01/24/18 on evaluation today patient actually apparently has two abdominal wounds the one on the right is new here and unfortunately seems to be doing a little bit worse and that it's a little bit larger. Fortunately there does not appear to be any sign of infection at this time. No fevers, chills, nausea, or vomiting noted at this time. Overall again it sounds like this is a fairly complex issue and despite the fact that we been using silver nitrate in the [facility] Dressing still has some hyper granulation. No fevers, chills, nausea, or vomiting noted at this time. 01/31/18 plan evaluation today patient appears to be doing better in general in regard to her wounds. She has been tolerating the dressing changes without complication. I do feel like that right now she's making some progress so she still is having some discomfort. 02/07/18 on evaluation today patient actually appears  to be doing rather well in regard to her double ulcers as far as I'm concerned. With that being said she is somewhat frustrated in general in regard to the overall appearance of everything. I feel like this is unwarranted however since she seems to be making progress. 02/15/2018; lower mid abdomen right much greater than left although the left was the original wound. The area on the left looks really quite good and is smaller. The area on the right seems longer and measures worse in terms of dimensions. These areas are in the most dependent part of her large abdominal pannus and this may be the entire issue. at [facility] on the left there is lymphedema around the wound. The patient is seeing dermatology  next week apparently in [place] 03/01/2017; she likely had abdomen. Right now he other than left ankle both he has better today. I think both of these areas are smaller. Surface looks healthier she is due to see dermatology later this month. Using [facility] 1/23; abdominal wounds in the lower mid abdomen. This is in a very dependent area in her lower abdominal pannus and the simple tension in this area may have a lot to do with this. She also has constant chronic pruritus and scratching and I wonder if this has something to do with open wounds in this area. She tells me her daughter is helping her with the dressings currently and the original wound on the left side of her mid abdomen is better almost closed the larger area on the right also looks some better. She sees a dermatologist next week I have told her to ask the dermatologist for help with generalized pruritus. The probable skin wounds that she has all over secondary to scratching. The wounds we have been treating may be related to simple tension in a dependent lymphedematous area. She could benefit from an abdominal binder to help support this area 2/13; abdominal wounds in the lower mid abdomen. Very dependent in her abdomen and this  may be secondary to tension in the lower part of her abdominal pannus plus or minus edema. 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 She claims not to be scratching at the wound areas. 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 They give her gentian violet to put on the wound area but this is already present and Hydrofera Blue. The original wound on the left is healed today. However the area on the right part of her mid abdominal pannus appears larger. We are going to try to work with her on getting an abdominal binder to help support the weight of the pannus around the wounds 3/9; abdominal wound in the lower mid abdomen. She apparently has an abdominal binder that she bought on [employer]. She is using Hydrofera Blue to the wound on the right of the midline. The original wound was on the left and is closed over. 3/26; abdominal wound in the lower mid abdomen. This is her second wound in this area. The lower part of her abdominal pannus surrounding surgical scar. Once again she is not wearing the abdominal binder she said she obtained. The dressing is Hydrofera Blue 4/16; abdomen in the right lower mid abdomen. This is the second wound in this area the other was a mirror image area on the left. I had her seen by dermatology at the [facility] this was not very helpful. She is not wearing her abdominal binder. We have been using Hydrofera Blue which is what healed at the other side. The abdominal wound is actually larger She arrives in clinic today complaining of the plantar right heel. Very tender. She has been pushing herself up in her bed with her heels and this may be a deep tissue injury. 4/23; abdomen wound is about the same. Spreading along the folds of her pannus somewhat but generally  the surface looks somewhat better. She is not using her abdominal binder although she did bring it in this time ooShe arrives in clinic complaining of pain and itching in the right lateral rib  area. She has 3 open areas here which are circular wounds. These are new ooShe also mentions her lower sacrum and there are 2 wounds in this area. These are new 5/7; abdominal wound is about the same although in general it looks better than a few weeks ago at which time it had punched out depth. No debridement is required. She will not wear her abdominal binder ooShe has areas on her right lateral rib cage 3 open areas circular wounds these look about the same as last time. I am assuming these have something to do with the prurigo nodularis that she may have scratched but I am not certain. Finally she has a linear area on her lower sacrum which I am assuming is a pressure area 5/21-Patient returns at 2 weeks for abdominal wound, areas on the right lateral rib cage, and lower sacrum linear area which is a pressure sore. We have been using silver alginate to all these wounds. She has been asked to wear abdominal binder which she is not. she has 2 new wounds on right flank area and on her coccyx. she has a total of 6 wounds 6/9; I have been following this patient for a prolonged period of time for wounds on her lower mid abdomen which I think were secondary to severe lymphedema. She had an area on the left of the midline that eventually healed only to open on the right side. This is still open we have been using silver alginate. She will not use an abdominal binder. About a month ago she arrived here with open areas on the right lateral ribs. This is in the middle of a large amount of soft tissue/pannus fold. She has 4 open areas here at this time. Finally she has 2 open areas in her lower coccyx. We have been using silver alginate on all the wound areas 6/25; we are following this woman for wounds on her  lower mid abdomen in the setting of a pannus and severe dependent lymphedema. More recently she has had areas in the folds on the right lateral rib area. Culture I did of this area last time was negative. Finally she has an area on the coccyx. We have been using silver alginate to all the wound areas 7/9; this is a patient with morbid obesity severe lymphedema in her lower abdominal pannus/severe dependent abdominal lymphedema. She first came to this clinic with an area on the left lower central abdomen. This eventually healed over. About the same time as it did heal over she developed an area in close juxtaposition on the other side of the midline. We have been following this for some months when she was here last time I thought things were improving although she a lot arrives today with a larger wound. She will not use her external abdominal binder. Over the last several weeks she has had areas on her right lateral rib area. The exact etiology of this is unclear we have been using silver alginate. She arrives today with the entire area inflamed angry with loss of surface epithelium and marked pruritus. I think this is tinea/Candida. This could be secondary or the primary in the etiology of these expanding areas. 7/23; the area on the right lateral rib areas is quite a bit better. Skin that we treated with oral and topical antifungals looks a lot better around this area. Unfortunately the lower abdominal area actually measures larger. There is no drainage here really no surrounding erythema. 8/6-Patient arrives to the  clinic at 3 weeks, the right lateral rib cage area has copious drainage but the wounds look a little better, the abdominal midline wound unfortunately is larger, she is not able to use the binder both wounds have a clean base 8/20. Since the patient was last here she was hospitalized from 813 through 818. This was predominantly because of hyperkalemia secondary to lisinopril and  dehydration. Lisinopril was discontinued at discharge. She was also felt to have mild cellulitis around several skin ulcers she was given antibiotics in the hospital but not discharged on antibiotics. Also noted that she has stage IV chronic kidney disease with a baseline creatinine at 1.7. 9/3 the patient still has a 2 wound areas lower mid abdominal pannus and the right lateral ribs. We have been using polymen to the abdominal wound and silver alginate to the area on the folds on her right lateral ribs. She is complaining about pruritus around the areas on the ribs. 9/17; 2-week follow-up. The area on the mid lower abdomen better using PolyMem. She has 4 small areas in the pannus fold on the right lateral ribs and is a new area on the left lateral lower abdominal folds this week. 10/1; 2-week follow-up. She has been using PolyMem on the mid lower abdomen and really not much improvement. She has this for small areas in the pannus fold of the right lateral ribs. These are gradually I think improving. The area that was new last time on the left lower abdominal pannus has closed over. 10/15; 2-week follow-up. I changed to silver collagen on the mid lower abdomen wound which seems smaller today. She is still using silver alginate on the right lateral ribs and a pannus fold and this looks better today although she is complaining of itching in this area She has had an outbreak on her face which almost looks like cystic acne. She is going to see her primary doctor about this tomorrow 11/5; silver collagen on the mid lower abdomen which seems to be doing a good job we are using silver alginate in the pannus folds on her right lateral ribs. She is complaining of pruritus in this area which is probably intertrigo/Candida. I will prescribe nystatin powder 12/3. We are using silver collagen to her mid lower abdomen which looks better. The pannus area on the right lateral ribs we are using silver alginate  she is pointing nystatin powder in here. The wounds look better. She is separating the folds with hand towels. We have suggested Interdry and she is going to look into getting that online 12/28; 1 month follow-up. In general the area on her mid lower abdomen looks a lot better. Surface area is smaller we have been using silver collagen. The pannus area on the right lateral ribs we have been using alginate. She also is putting Interdry in these areas. 1/25; 1 month follow-up. The area on her abdomen is about the same. We have been using silver collagen here. The areas on her right thorax and the pannus folds in this area are down to 3 small wounds we have been using silver alginate here 3/1; the area on her pannus of the lower abdomen on the right of midline is about the same. We have been using silver collagen for a protracted period and previously we have used Hydrofera Blue. ooThe area is in a pannus fold in her right lateral thorax actually are smaller. There are 3 wounds in this area. She is using calcium alginate here. She is  complaining of increasing ankle edema she saw her primary doctor at the [place] in [place] and had her Lasix increased otherwise she has no additional new complaint 4/8; the area on the pannus of her lower abdomen looks somewhat better. We have been using polymen. The area in the pannus in her right lateral thorax are down to 2 small open areas. We have been using calcium alginate Objective Constitutional Sitting or standing Blood Pressure is within target range for patient.. Pulse regular and within target range for patient.Marland Kitchen Respirations regular, non-labored and within target range.. Temperature is normal and within the target range for the patient.Marland Kitchen Appears in no distress. Vitals Time Taken: 10:23 AM, Height: 62 in, Weight: 260 lbs, BMI: 47.5, Temperature: 97.9 F, Pulse: 83 bpm, Respiratory Rate: 20 breaths/min, Blood Pressure: 104/66 mmHg. Gastrointestinal  (GI) Markedly obese nontender. General Notes: Wound exam; midline lower abdominal wall just to the right of midline. I think this looks somewhat better. Tissue looks healthy. Certainly no debridement is necessary there is no surrounding tenderness or evidence of infection. Integumentary (Hair, Skin) Wound #3 status is Open. Original cause of wound was Gradually Appeared. The wound is located on the Right Abdomen - Lower Quadrant. The wound measures 5.2cm length x 2.8cm width x 0.1cm depth; 11.435cm^2 area and 1.144cm^3 volume. There is Fat Layer (Subcutaneous Tissue) Exposed exposed. There is no tunneling or undermining noted. There is a medium amount of serosanguineous drainage noted. The wound margin is distinct with the outline attached to the wound base. There is large (67-100%) pink granulation within the wound bed. There is no necrotic tissue within the wound bed. Wound #4 status is Open. Original cause of wound was Gradually Appeared. The wound is located on the Right Flank. The wound measures 0.4cm length x 0.5cm width x 0.1cm depth; 0.157cm^2 area and 0.016cm^3 volume. There is Fat Layer (Subcutaneous Tissue) Exposed exposed. There is no tunneling or undermining noted. There is a small amount of serosanguineous drainage noted. The wound margin is distinct with the outline attached to the wound base. There is large (67-100%) red granulation within the wound bed. There is no necrotic tissue within the wound bed. Wound #7 status is Open. Original cause of wound was Gradually Appeared. The wound is located on the Right,Proximal Flank. The wound measures 0.5cm length x 1cm width x 0.1cm depth; 0.393cm^2 area and 0.039cm^3 volume. There is Fat Layer (Subcutaneous Tissue) Exposed exposed. There is no tunneling or undermining noted. There is a small amount of serosanguineous drainage noted. The wound margin is flat and intact. There is large (67-100%) red granulation within the wound bed.  There is no necrotic tissue within the wound bed. Assessment Active Problems ICD-10 Disruption of external operation (surgical) wound, not elsewhere classified, subsequent encounter Unspecified open wound of abdominal wall, left lower quadrant without penetration into peritoneal cavity, subsequent encounter Non-pressure chronic ulcer of skin of other sites with other specified severity Lymphedema, not elsewhere classified Tinea corporis Plan Follow-up Appointments: Return appointment in 1 month. Dressing Change Frequency: Other: - all wounds - change dressing twice a week. Skin Barriers/Peri-Wound Care: Skin Prep Wound Cleansing: May shower and wash wound with soap and water. Primary Wound Dressing: Wound #3 Right Abdomen - Lower Quadrant: Polymem Silver Wound #4 Right Flank: Calcium Alginate Wound #7 Right,Proximal Flank: Calcium Alginate Secondary Dressing: Wound #3 Right Abdomen - Lower Quadrant: Dry Gauze ABD pad - secure with tape Wound #4 Right Flank: ABD pad Drawtex Wound #7 Right,Proximal Flank: ABD pad Drawtex Off-Loading:  Turn and reposition every 2 hours Other: - abdominal binder to support abdomen. Patient to float right heel while in chair or bed with pillow. Patient to keep pressure off right heel as much as possible. Roll up a face cloth or a pillowcase to separate all skin folds- under breast, abdomen, both flank areas. Additional Orders / Instructions: Other: - ***[facility] INTERDRY 10"x 12 feet roll or patient to purchase offline from [employer].*** Home Health: [facility] for wound care. - [facility] 1. The wounds in the right thorax are quite a bit better. Continue using calcium alginate 2. The original wound in the right midline lower abdomen also looks somewhat better I continued with PolyMem Ag here. 3. We will see her back in a month Electronic Signature(s) Signed: 05/30/2019 5:58:56 PM By:  Linton Ham MD Entered By: Linton Ham on 05/30/2019 11:54:39 -------------------------------------------------------------------------------- SuperBill Details Patient Name: Date of Service: [name] [name] 05/30/2019 Medical Record Number:[id number] Patient Account Number: [id number] Date of Birth/Sex: Treating RN: [birth year] (64 y.o. Helene Shoe, Tammi Klippel Primary Care Provider: SYSTEM, PCP Other Clinician: Referring Provider: Treating Provider/Extender:Cadee Agro, Dulcy Fanny, Fuller Plan in Treatment: 100 Diagnosis Coding ICD-10 Codes Code Description Disruption of external operation (surgical) wound, not elsewhere classified, subsequent Disruption of external operation (surgical) wound, not elsewhere classified, subsequent T81.31XD encounter Unspecified open wound of abdominal wall, left lower quadrant without penetration into peritoneal S31.104D cavity, subsequent encounter L98.498 Non-pressure chronic ulcer of skin of other sites with other specified severity I89.0 Lymphedema, not elsewhere classified B35.4 Tinea corporis Facility Procedures The patient participates with Medicare or their insurance follows the Medicare Facility Guidelines: CPT4 Code Description Modifier Quantity [id number] - WOUND CARE VISIT-LEV 5 EST PT 1 Physician Procedures CPT4 Description: Code [id number] - WC PHYS LEVEL 3 - EST PT ICD-10 Diagnosis Description T81.31XD Disruption of external operation (surgical) wound, not elsew subsequent encounter S31.104D Unspecified open wound of abdominal wall, left lower quadran  into peritoneal cavity, subsequent encounter Modifier: here classified, t without penetr Quantity: 1 ation Electronic Signature(s) Signed: 05/30/2019 5:58:56 PM By: Linton Ham MD Entered By: Linton Ham on 05/30/2019 11:54:56

## 2019-06-03 NOTE — Progress Notes (Signed)
[name] [name], [name] [name] ([id number]) Visit Report for 05/30/2019 Arrival Information Details Patient Name: Date of Service: [name] [name], [name] [name] 05/30/2019 10:00 AM Medical Record Number:[id number] Patient Account Number: [id number] Date of Birth/Sex: Treating RN: August 05, 1955 (64 y.o. [name] Primary Care Uriyah Massimo: SYSTEM, PCP Other Clinician: Referring Kilyn Maragh: Treating Jhayla Podgorski/Extender:Robson, Dulcy Fanny, Fuller Plan in Treatment: 100 Visit Information History Since Last Visit Added or deleted any medications: No Patient Arrived: Wheel Chair Any new allergies or adverse reactions: No Arrival Time: 10:21 Had a fall or experienced change in No activities of daily living that may affect Accompanied By: alone risk of falls: Transfer Assistance: Manual Signs or symptoms of abuse/neglect since last No Patient Identification Verified: Yes visito Secondary Verification Process Completed: Yes Hospitalized since last visit: No Patient Requires Transmission-Based No Implantable device outside of the clinic excluding No Precautions: cellular tissue based products placed in the center Patient Has Alerts: No since last visit: Has Dressing in Place as Prescribed: Yes Pain Present Now: Yes Electronic Signature(s) Signed: 06/03/2019 6:12:10 PM By: Levan Hurst RN, BSN Entered By: Levan Hurst on 05/30/2019 10:21:42 -------------------------------------------------------------------------------- Clinic Level of Care Assessment Details Patient Name: Date of Service: [name] [name], [name] [name]. 05/30/2019 10:00 AM Medical Record [id number] Patient Account Number: [id number] Date of Birth/Sex: Treating RN: Mar 17, 1955 (64 y.o. Helene Shoe, Tammi Klippel Primary Care Harini Dearmond: SYSTEM, PCP Other Clinician: Referring Gustave Lindeman: Treating Lucero Ide/Extender:Robson, Dulcy Fanny, Fuller Plan in Treatment: 100 Clinic Level of Care Assessment Items TOOL 4 Quantity Score X - Use when only an  EandM is performed on FOLLOW-UP visit 1 0 ASSESSMENTS - Nursing Assessment / Reassessment X - Reassessment of Co-morbidities (includes updates in patient status) 1 10 X - Reassessment of Adherence to Treatment Plan 1 5 ASSESSMENTS - Wound and Skin Assessment / Reassessment []  - Simple Wound Assessment / Reassessment - one wound 0 X - Complex Wound Assessment / Reassessment - multiple wounds 3 5 X - Dermatologic / Skin Assessment (not related to wound area) 1 10 ASSESSMENTS - Focused Assessment []  - Circumferential Edema Measurements - multi extremities 0 X - Nutritional Assessment / Counseling / Intervention 1 10 []  - Lower Extremity Assessment (monofilament, tuning fork, pulses) 0 []  - Peripheral Arterial Disease Assessment (using hand held doppler) 0 ASSESSMENTS - Ostomy and/or Continence Assessment and Care []  - Incontinence Assessment and Management 0 []  - Ostomy Care Assessment and Management (repouching, etc.) 0 PROCESS - Coordination of Care []  - Simple Patient / Family Education for ongoing care 0 X - Complex (extensive) Patient / Family Education for ongoing care 1 20 X - Staff obtains Programmer, systems, Records, Test Results / Process Orders 1 10 X - Staff telephones HHA, Nursing Homes / Clarify orders / etc 1 10 []  - Routine Transfer to another Facility (non-emergent condition) 0 []  - Routine Hospital Admission (non-emergent condition) 0 []  - New Admissions / Biomedical engineer / Ordering NPWT, Apligraf, etc. 0 []  - Emergency Hospital Admission (emergent condition) 0 []  - Simple Discharge Coordination 0 X - Complex (extensive) Discharge Coordination 1 15 PROCESS - Special Needs []  - Pediatric / Minor Patient Management 0 []  - Isolation Patient Management 0 []  - Hearing / Language / Visual special needs 0 []  - Assessment of Community assistance (transportation, D/C planning, etc.) 0 []  - Additional assistance / Altered mentation 0 []  - Support Surface(s) Assessment (bed,  cushion, seat, etc.) 0 INTERVENTIONS - Wound Cleansing / Measurement []  - Simple Wound Cleansing - one wound 0 X - Complex Wound Cleansing - multiple wounds 3 5  X - Wound Imaging (photographs - any number of wounds) 1 5 []  - Wound Tracing (instead of photographs) 0 []  - Simple Wound Measurement - one wound 0 X - Complex Wound Measurement - multiple wounds 3 5 INTERVENTIONS - Wound Dressings []  - Small Wound Dressing one or multiple wounds 0 X - Medium Wound Dressing one or multiple wounds 3 15 []  - Large Wound Dressing one or multiple wounds 0 []  - Application of Medications - topical 0 []  - Application of Medications - injection 0 INTERVENTIONS - Miscellaneous []  - External ear exam 0 []  - Specimen Collection (cultures, biopsies, blood, body fluids, etc.) 0 []  - Specimen(s) / Culture(s) sent or taken to Lab for analysis 0 []  - Patient Transfer (multiple staff / Civil Service fast streamer / Similar devices) 0 []  - Simple Staple / Suture removal (25 or less) 0 []  - Complex Staple / Suture removal (26 or more) 0 []  - Hypo / Hyperglycemic Management (close monitor of Blood Glucose) 0 []  - Ankle / Brachial Index (ABI) - do not check if billed separately 0 X - Vital Signs 1 5 Has the patient been seen at the hospital within the last three years: Yes Total Score: 190 Level Of Care: New/Established - Level 5 Electronic Signature(s) Signed: 05/30/2019 6:08:37 PM By: Deon Pilling Entered By: Deon Pilling on 05/30/2019 11:16:40 -------------------------------------------------------------------------------- Encounter Discharge Information Details Patient Name: Date of Service: [name] Dakin. 05/30/2019 10:00 AM Medical Record [id number] Patient Account Number: [id number] Date of Birth/Sex: Treating RN: [birth year] (64 y.o. [name] Primary Care Giannie Soliday: SYSTEM, PCP Other Clinician: Referring Elysabeth Aust: Treating Treyana Sturgell/Extender:Robson, Dulcy Fanny, Fuller Plan in Treatment:  100 Encounter Discharge Information Items Discharge Condition: Stable Ambulatory Status: Wheelchair Discharge Destination: Home Transportation: Private Auto Accompanied By: son Schedule Follow-up Appointment: Yes Clinical Summary of Care: Patient Declined Electronic Signature(s) Signed: 05/31/2019 6:10:03 PM By: Baruch Gouty RN, BSN Entered By: Baruch Gouty on 05/30/2019 11:35:11 -------------------------------------------------------------------------------- Multi Wound Chart Details Patient Name: Date of Service: [name] Dakin. 05/30/2019 10:00 AM Medical Record [id number] Patient Account Number: [id number] Date of Birth/Sex: Treating RN: 08-13-1955 (64 y.o. Helene Shoe, Tammi Klippel Primary Care Nyrie Sigal: SYSTEM, PCP Other Clinician: Referring Jeffren Dombek: Treating Asier Desroches/Extender:Robson, Dulcy Fanny, Fuller Plan in Treatment: 100 Vital Signs Height(in): 62 Pulse(bpm): 83 Weight(lbs): 260 Blood Pressure(mmHg): 104/66 Body Mass Index(BMI): 48 Temperature(F): 97.9 Respiratory 20 Rate(breaths/min): Photos: [3:No Photos] [4:No Photos] [7:No Photos] Wound Location: [3:Right Abdomen - Lower Quadrant] [4:Right Flank] [7:Right, Proximal Flank] Wounding Event: [3:Gradually Appeared] [4:Gradually Appeared] [7:Gradually Appeared] Primary Etiology: [3:Dehisced Wound] [4:MASD Moisture Associated Skin Damage] [7:Inflammatory] Comorbid History: [3:Cataracts, Lymphedema, Cataracts, Lymphedema, Cataracts, Lymphedema, Sleep Apnea, Hypertension, Sleep Apnea, Hypertension, Sleep Apnea, Hypertension, Type II Diabetes, End Stage Type II Diabetes, End Stage Type II Diabetes, End Stage  Renal Disease, Gout, Rheumatoid Arthritis, Neuropathy] [4:Renal Disease, Gout, Rheumatoid Arthritis, Neuropathy] [7:Renal Disease, Gout, Rheumatoid Arthritis, Neuropathy] Date Acquired: [3:12/07/2017] [4:05/31/2018] [7:04/17/2019] Weeks of Treatment: [3:76] [4:50] [7:5] Wound Status: [3:Open] [4:Open]  [7:Open] Clustered Wound: [3:No] [4:Yes] [7:No] Clustered Quantity: [3:N/A] [4:3] [7:N/A] Measurements L x W x D 5.2x2.8x0.1 [4:0.4x0.5x0.1] [7:0.5x1x0.1] (cm) Area (cm) : [3:11.435] [4:0.157] [7:0.393] Volume (cm) : [3:1.144] [4:0.016] [7:0.039] % Reduction in Area: [3:-76.50%] [4:98.10%] [7:-138.20%] % Reduction in Volume: 80.40% [4:98.00%] [7:-143.70%] Classification: [3:Full Thickness Without Exposed Support Structures] [4:N/A] [7:Full Thickness Without Exposed Support Structures] Exudate Amount: [3:Medium] [4:Small] [7:Small] Exudate Type: [3:Serosanguineous] [4:Serosanguineous] [7:Serosanguineous] Exudate Color: [3:red, brown] [4:red, brown] [7:red, brown] Wound Margin: [3:Distinct, outline attached Distinct, outline attached Flat and Intact] Granulation Amount: [3:Large (  67-100%)] [4:Large (67-100%)] [7:Large (67-100%)] Granulation Quality: [3:Pink] [4:Red] [7:Red] Necrotic Amount: [3:None Present (0%)] [4:None Present (0%)] [7:None Present (0%)] Exposed Structures: [3:Fat Layer (Subcutaneous Fat Layer (Subcutaneous Fat Layer (Subcutaneous Tissue) Exposed: Yes Fascia: No Tendon: No Muscle: No Joint: No Bone: No Small (1-33%)] [4:Tissue) Exposed: Yes Fascia: No Tendon: No Muscle: No Joint: No Bone: No Large  (67-100%)] [7:Tissue) Exposed: Yes Fascia: No Tendon: No Muscle: No Joint: No Bone: No None] Treatment Notes Wound #3 (Right Abdomen - Lower Quadrant) 3. Primary Dressing Applied Polymem Ag 4. Secondary Dressing ABD Pad Dry Gauze 5. Secured With Tape Wound #4 (Right Flank) 3. Primary Dressing Applied Calcium Alginate Ag 4. Secondary Dressing ABD Pad 5. Secured With Tape Wound #7 (Right, Proximal Flank) 3. Primary Dressing Applied Calcium Alginate Ag 4. Secondary Dressing ABD Pad 5. Secured With Recruitment consultant) Signed: 05/30/2019 5:58:56 PM By: Linton Ham MD Signed: 05/30/2019 6:08:37 PM By: Deon Pilling Entered By: Linton Ham on 05/30/2019  11:51:36 -------------------------------------------------------------------------------- Multi-Disciplinary Care Plan Details Patient Name: Date of Service: [name] Dakin. 05/30/2019 10:00 AM Medical Record Number:[id number] Patient Account Number: [id number] Date of Birth/Sex: Treating RN: October 31, 1955 (64 y.o. [name] Primary Care Marieta Markov: SYSTEM, PCP Other Clinician: Referring Ashlynd Michna: Treating Samella Lucchetti/Extender:Robson, Dulcy Fanny, Fuller Plan in Treatment: 100 Active Inactive Wound/Skin Impairment Nursing Diagnoses: Impaired tissue integrity Goals: Patient/caregiver will verbalize understanding of skin care regimen Date Initiated: 09/07/2017 Target Resolution Date: 06/21/2019 Goal Status: Active Ulcer/skin breakdown will have a volume reduction of 30% by week 4 Date Initiated: 06/29/2017 Date Inactivated: 08/04/2017 Target Resolution Date: 09/01/2017 Goal Status: Unmet Unmet Reason: larger Interventions: Assess patient/caregiver ability to obtain necessary supplies Assess patient/caregiver ability to perform ulcer/skin care regimen upon admission and as needed Assess ulceration(s) every visit Provide education on ulcer and skin care Notes: Electronic Signature(s) Signed: 05/30/2019 6:08:37 PM By: Deon Pilling Entered By: Deon Pilling on 05/30/2019 11:10:06 -------------------------------------------------------------------------------- Pain Assessment Details Patient Name: Date of Service: JATON, EILERS 05/30/2019 10:00 AM Medical Record Number:[id number] Patient Account Number: [id number] Date of Birth/Sex: Treating RN: August 16, 1955 (64 y.o. [name] Primary Care Elise Gladden: SYSTEM, PCP Other Clinician: Referring Fraida Veldman: Treating Nuriyah Hanline/Extender:Robson, Dulcy Fanny, Fuller Plan in Treatment: 100 Active Problems Location of Pain Severity and Description of Pain Patient Has Paino Yes Site Locations Pain Location: Pain in  Ulcers With Dressing Change: Yes Duration of the Pain. Constant / Intermittento Intermittent Rate the pain. Current Pain Level: 6 Character of Pain Describe the Pain: Burning Pain Management and Medication Current Pain Management: Medication: Yes Cold Application: No Rest: No Massage: No Activity: No T.E.N.S.: No Heat Application: No Leg drop or elevation: No Is the Current Pain Management Adequate: Adequate How does your wound impact your activities of daily livingo Sleep: No Bathing: No Appetite: No Relationship With Others: No Bladder Continence: No Emotions: No Bowel Continence: No Work: No Toileting: No Drive: No Dressing: No Hobbies: No Electronic Signature(s) Signed: 06/03/2019 6:12:10 PM By: Levan Hurst RN, BSN Entered By: Levan Hurst on 05/30/2019 10:31:24 -------------------------------------------------------------------------------- Patient/Caregiver Education Details Patient Name: Date of Service: [name] [name], [name] M. 4/8/2021andnbsp10:00 AM Medical Record [id number] Patient Account Number: [id number] Date of Birth/Gender: Treating RN: June 17, 1955 (64 y.o. [name] Primary Care Physician: SYSTEM, PCP Other Clinician: Referring Physician: Treating Physician/Extender:Robson, Dulcy Fanny, Fuller Plan in Treatment: 100 Education Assessment Education Provided To: Patient Education Topics Provided Wound/Skin Impairment: Handouts: Skin Care Do's and Dont's Methods: Explain/Verbal Responses: Reinforcements needed Electronic Signature(s) Signed: 05/30/2019 6:08:37 PM By: Deon Pilling Entered By: Rolin Barry  Bobbi on 05/30/2019 11:10:24 -------------------------------------------------------------------------------- Wound Assessment Details Patient Name: Date of Service: [name] [name], [name] [name] 05/30/2019 10:00 AM Medical Record Number:[id number] Patient Account Number: [id number] Date of Birth/Sex: Treating RN: 1955-12-26 (64 y.o. [name] Primary Care Forrestine Lecrone: SYSTEM, PCP Other Clinician: Referring Levoy Geisen: Treating Lalani Winkles/Extender:Robson, Dulcy Fanny, Fuller Plan in Treatment: 100 Wound Status Wound Number: 3 Primary Dehisced Wound Etiology: Wound Location: Right Abdomen - Lower Quadrant Wound Open Wounding Event: Gradually Appeared Status: Date Acquired: 12/07/2017 Comorbid Cataracts, Lymphedema, Sleep Apnea, Weeks Of Treatment: 76 History: Hypertension, Type II Diabetes, End Stage Clustered Wound: No Renal Disease, Gout, Rheumatoid Arthritis, Neuropathy Photos Photo Uploaded By: Mikeal Hawthorne on 05/31/2019 12:19:28 Wound Measurements Length: (cm) 5.2 % Reduct Width: (cm) 2.8 % Reduct Depth: (cm) 0.1 Epitheli Area: (cm) 11.435 Tunneli Volume: (cm) 1.144 Undermi Wound Description Full Thickness Without Exposed Support Foul Odo Classification: Structures Slough/F Wound Distinct, outline attached Margin: Exudate Medium Amount: Exudate Serosanguineous Type: Exudate red, brown Color: Wound Bed Granulation Amount: Large (67-100%) Granulation Quality: Pink Fascia E Necrotic Amount: None Present (0%) Fat Laye Tendon E Muscle E Joint Ex Bone Exp r After Cleansing: No ibrino No Exposed Structure xposed: No r (Subcutaneous Tissue) Exposed: Yes xposed: No xposed: No posed: No osed: No ion in Area: -76.5% ion in Volume: 80.4% alization: Small (1-33%) ng: No ning: No Treatment Notes Wound #3 (Right Abdomen - Lower Quadrant) 3. Primary Dressing Applied Polymem Ag 4. Secondary Dressing ABD Pad Dry Gauze 5. Secured With Recruitment consultant) Signed: 06/03/2019 6:12:10 PM By: Levan Hurst RN, BSN Entered By: Levan Hurst on 05/30/2019 10:32:35 -------------------------------------------------------------------------------- Wound Assessment Details Patient Name: Date of Service: [name] [name], [name] [name] 05/30/2019 10:00 AM Medical Record Number:[id number]  Patient Account Number: [id number] Date of Birth/Sex: Treating RN: Jul 15, 1955 (64 y.o. [name] Primary Care Sorren Vallier: SYSTEM, PCP Other Clinician: Referring Charlen Bakula: Treating Coley Kulikowski/Extender:Robson, Dulcy Fanny, Fuller Plan in Treatment: 100 Wound Status Wound Number: 4 Primary MASD Moisture Associated Skin Damage Etiology: Wound Location: Right Flank Wound Open Wounding Event: Gradually Appeared Status: Date Acquired: 05/31/2018 Comorbid Cataracts, Lymphedema, Sleep Apnea, Weeks Of Treatment: 50 History: Hypertension, Type II Diabetes, End Stage Clustered Wound: Yes Renal Disease, Gout, Rheumatoid Arthritis, Neuropathy Photos Photo Uploaded By: Mikeal Hawthorne on 05/31/2019 14:21:46 Wound Measurements Length: (cm) 0.4 % Reductio Width: (cm) 0.5 % Reductio Depth: (cm) 0.1 Epithelial Clustered Quantity: 3 Tunneling: Area: (cm) 0.157 Undermini Volume: (cm) 0.016 Wound Description Wound Margin: Distinct, outline attached Exudate Amount: Small Exudate Type: Serosanguineous Exudate Color: red, brown Wound Bed Granulation Amount: Large (67-100%) Granulation Quality: Red Necrotic Amount: None Present (0%) Foul Odor After Cleansing: No Slough/Fibrino No Exposed Structure Fascia Exposed: No Fat Layer (Subcutaneous Tissue) Exposed: Yes Tendon Exposed: No Muscle Exposed: No Joint Exposed: No Bone Exposed: No n in Area: 98.1% n in Volume: 98% ization: Large (67-100%) No ng: No Treatment Notes Wound #4 (Right Flank) 3. Primary Dressing Applied Calcium Alginate Ag 4. Secondary Dressing ABD Pad 5. Secured With Recruitment consultant) Signed: 06/03/2019 6:12:10 PM By: Levan Hurst RN, BSN Entered By: Levan Hurst on 05/30/2019 10:32:56 -------------------------------------------------------------------------------- Wound Assessment Details Patient Name: Date of Service: [name] [name], [name] [name] 05/30/2019 10:00 AM Medical Record  Number:[id number] Patient Account Number: [id number] Date of Birth/Sex: Treating RN: [birth year] (64 y.o. [name] Primary Care Zamantha Strebel: SYSTEM, PCP Other Clinician: Referring Ambrose Wile: Treating Keysha Damewood/Extender:Robson, Dulcy Fanny, Fuller Plan in Treatment: 100 Wound Status Wound Number: 7 Primary Inflammatory Etiology: Wound Location: Right, Proximal Flank Wound Open Wounding Event: Gradually Appeared  Status: Date Acquired: 04/17/2019 Comorbid Cataracts, Lymphedema, Sleep Apnea, Weeks Of Treatment: 5 History: Hypertension, Type II Diabetes, End Stage Clustered Wound: No Renal Disease, Gout, Rheumatoid Arthritis, Neuropathy Photos Photo Uploaded By: Mikeal Hawthorne on 05/31/2019 12:19:30 Wound Measurements Length: (cm) 0.5 % Reduct Width: (cm) 1 % Reduct Depth: (cm) 0.1 Epitheli Area: (cm) 0.393 Tunneli Volume: (cm) 0.039 Undermi Wound Description Classification: Full Thickness Without Exposed Support Foul Odo Structures Slough/F Wound Flat and Intact Margin: Exudate Small Amount: Exudate Serosanguineous Type: Exudate red, brown Color: Wound Bed Granulation Amount: Large (67-100%) Granulation Quality: Red Fascia E Necrotic Amount: None Present (0%) Fat Layer ( Tendon Expo Muscle Expo Joint Expos Bone Expose r After Cleansing: No ibrino No Exposed Structure xposed: No Subcutaneous Tissue) Exposed: Yes sed: No sed: No ed: No d: No ion in Area: -138.2% ion in Volume: -143.7% alization: None ng: No ning: No Treatment Notes Wound #7 (Right, Proximal Flank) 3. Primary Dressing Applied Calcium Alginate Ag 4. Secondary Dressing ABD Pad 5. Secured With Recruitment consultant) Signed: 06/03/2019 6:12:10 PM By: Levan Hurst RN, BSN Entered By: Levan Hurst on 05/30/2019 10:33:23 -------------------------------------------------------------------------------- Vitals Details Patient Name: Date of Service: [name] Dakin.  05/30/2019 10:00 AM Medical Record Number:[id number] Patient Account Number: [id number] Date of Birth/Sex: Treating RN: 1956/01/06 (64 y.o. [name] Primary Care Vertis Scheib: SYSTEM, PCP Other Clinician: Referring Sherrilyn Nairn: Treating Larsen Dungan/Extender:Robson, Dulcy Fanny, Fuller Plan in Treatment: 100 Vital Signs Time Taken: 10:23 Temperature (F): 97.9 Height (in): 62 Pulse (bpm): 83 Weight (lbs): 260 Respiratory Rate (breaths/min): 20 Body Mass Index (BMI): 47.5 Blood Pressure (mmHg): 104/66 Reference Range: 80 - 120 mg / dl Electronic Signature(s) Signed: 06/03/2019 6:12:10 PM By: Levan Hurst RN, BSN Entered By: Levan Hurst on 05/30/2019 10:30:55

## 2019-06-27 ENCOUNTER — Encounter (HOSPITAL_BASED_OUTPATIENT_CLINIC_OR_DEPARTMENT_OTHER): Payer: No Typology Code available for payment source | Admitting: Internal Medicine

## 2019-07-04 ENCOUNTER — Encounter (HOSPITAL_BASED_OUTPATIENT_CLINIC_OR_DEPARTMENT_OTHER): Payer: No Typology Code available for payment source | Admitting: Internal Medicine

## 2019-07-05 ENCOUNTER — Encounter (HOSPITAL_BASED_OUTPATIENT_CLINIC_OR_DEPARTMENT_OTHER): Payer: No Typology Code available for payment source | Admitting: Internal Medicine

## 2019-07-09 ENCOUNTER — Encounter (HOSPITAL_BASED_OUTPATIENT_CLINIC_OR_DEPARTMENT_OTHER): Payer: Medicare Other | Attending: Internal Medicine | Admitting: Internal Medicine

## 2019-07-09 DIAGNOSIS — T8131XD Disruption of external operation (surgical) wound, not elsewhere classified, subsequent encounter: Secondary | ICD-10-CM | POA: Insufficient documentation

## 2019-07-09 DIAGNOSIS — X58XXXA Exposure to other specified factors, initial encounter: Secondary | ICD-10-CM | POA: Insufficient documentation

## 2019-07-09 DIAGNOSIS — I890 Lymphedema, not elsewhere classified: Secondary | ICD-10-CM | POA: Insufficient documentation

## 2019-07-09 DIAGNOSIS — I120 Hypertensive chronic kidney disease with stage 5 chronic kidney disease or end stage renal disease: Secondary | ICD-10-CM | POA: Diagnosis not present

## 2019-07-09 DIAGNOSIS — N186 End stage renal disease: Secondary | ICD-10-CM | POA: Diagnosis not present

## 2019-07-09 DIAGNOSIS — E1122 Type 2 diabetes mellitus with diabetic chronic kidney disease: Secondary | ICD-10-CM | POA: Insufficient documentation

## 2019-07-09 DIAGNOSIS — M069 Rheumatoid arthritis, unspecified: Secondary | ICD-10-CM | POA: Insufficient documentation

## 2019-07-09 DIAGNOSIS — M109 Gout, unspecified: Secondary | ICD-10-CM | POA: Diagnosis not present

## 2019-07-09 DIAGNOSIS — B354 Tinea corporis: Secondary | ICD-10-CM | POA: Diagnosis not present

## 2019-07-09 DIAGNOSIS — L98498 Non-pressure chronic ulcer of skin of other sites with other specified severity: Secondary | ICD-10-CM | POA: Diagnosis not present

## 2019-07-09 DIAGNOSIS — E1151 Type 2 diabetes mellitus with diabetic peripheral angiopathy without gangrene: Secondary | ICD-10-CM | POA: Diagnosis not present

## 2019-07-09 DIAGNOSIS — Z6841 Body Mass Index (BMI) 40.0 and over, adult: Secondary | ICD-10-CM | POA: Diagnosis not present

## 2019-07-09 DIAGNOSIS — E1140 Type 2 diabetes mellitus with diabetic neuropathy, unspecified: Secondary | ICD-10-CM | POA: Diagnosis not present

## 2019-07-09 DIAGNOSIS — E11622 Type 2 diabetes mellitus with other skin ulcer: Secondary | ICD-10-CM | POA: Diagnosis present

## 2019-07-09 NOTE — Progress Notes (Addendum)
JOLIET, MALLOZZI ([id number]) Visit Report for 07/09/2019 Arrival Information Details Patient Name: Date of Service: [name] [name] [facility] M. 07/09/2019 10:45 A M Medical Record Number: [id number] Patient Account Number: [id number] Date of Birth/Sex: Treating RN: 1955/08/02 (64 y.o. Orvan Falconer Primary Care Dastan Krider: SYSTEM, PCP Other Clinician: Referring Twylla Arceneaux: Treating Deedra Pro/Extender: Lily Peer in Treatment: 105 Visit Information History Since Last Visit Added or deleted any medications: No Patient Arrived: Wheel Chair Any new allergies or adverse reactions: No Arrival Time: 11:31 Had a fall or experienced change in Yes Accompanied By: self activities of daily living that may affect Transfer Assistance: None risk of falls: Patient Identification Verified: Yes Signs or symptoms of abuse/neglect since last visito No Secondary Verification Process Completed: Yes Hospitalized since last visit: No Patient Requires Transmission-Based Precautions: No Implantable device outside of the clinic excluding No Patient Has Alerts: No cellular tissue based products placed in the center since last visit: Has Dressing in Place as Prescribed: Yes Pain Present Now: Yes Electronic Signature(s) Signed: 07/09/2019 3:47:52 PM By: Deon Pilling Entered By: Deon Pilling on 07/09/2019 11:32:19 -------------------------------------------------------------------------------- Clinic Level of Care Assessment Details Patient Name: Date of Service: Particia [name]. 07/09/2019 10:45 A M Medical Record Number: [id number] Patient Account Number: [id number] Date of Birth/Sex: Treating RN: 05-07-1955 (64 y.o. Orvan Falconer Primary Care Suzanne Garbers: SYSTEM, PCP Other Clinician: Referring Jozelyn Kuwahara: Treating Teaghan Formica/Extender: Lily Peer in Treatment: 105 Clinic Level of Care Assessment Items TOOL 4 Quantity Score X- 1 0 Use when only  an EandM is performed on FOLLOW-UP visit ASSESSMENTS - Nursing Assessment / Reassessment X- 1 10 Reassessment of Co-morbidities (includes updates in patient status) X- 1 5 Reassessment of Adherence to Treatment Plan ASSESSMENTS - Wound and Skin A ssessment / Reassessment X - Simple Wound Assessment / Reassessment - one wound 1 5 []  - 0 Complex Wound Assessment / Reassessment - multiple wounds []  - 0 Dermatologic / Skin Assessment (not related to wound area) ASSESSMENTS - Focused Assessment []  - 0 Circumferential Edema Measurements - multi extremities []  - 0 Nutritional Assessment / Counseling / Intervention []  - 0 Lower Extremity Assessment (monofilament, tuning fork, pulses) []  - 0 Peripheral Arterial Disease Assessment (using hand held doppler) ASSESSMENTS - Ostomy and/or Continence Assessment and Care []  - 0 Incontinence Assessment and Management []  - 0 Ostomy Care Assessment and Management (repouching, etc.) PROCESS - Coordination of Care X - Simple Patient / Family Education for ongoing care 1 15 []  - 0 Complex (extensive) Patient / Family Education for ongoing care X- 1 10 Staff obtains Programmer, systems, Records, T Results / Process Orders est []  - 0 Staff telephones HHA, Nursing Homes / Clarify orders / etc []  - 0 Routine Transfer to another Facility (non-emergent condition) []  - 0 Routine Hospital Admission (non-emergent condition) []  - 0 New Admissions / Biomedical engineer / Ordering NPWT Apligraf, etc. , []  - 0 Emergency Hospital Admission (emergent condition) X- 1 10 Simple Discharge Coordination []  - 0 Complex (extensive) Discharge Coordination PROCESS - Special Needs []  - 0 Pediatric / Minor Patient Management []  - 0 Isolation Patient Management []  - 0 Hearing / Language / Visual special needs []  - 0 Assessment of Community assistance (transportation, D/C planning, etc.) []  - 0 Additional assistance / Altered mentation []  - 0 Support Surface(s)  Assessment (bed, cushion, seat, etc.) INTERVENTIONS - Wound Cleansing / Measurement []  - 0 Simple Wound Cleansing - one wound X- 2 5 Complex Wound  Cleansing - multiple wounds X- 1 5 Wound Imaging (photographs - any number of wounds) []  - 0 Wound Tracing (instead of photographs) []  - 0 Simple Wound Measurement - one wound X- 2 5 Complex Wound Measurement - multiple wounds INTERVENTIONS - Wound Dressings []  - 0 Small Wound Dressing one or multiple wounds X- 1 15 Medium Wound Dressing one or multiple wounds X- 1 20 Large Wound Dressing one or multiple wounds X- 1 5 Application of Medications - topical []  - 0 Application of Medications - injection INTERVENTIONS - Miscellaneous []  - 0 External ear exam []  - 0 Specimen Collection (cultures, biopsies, blood, body fluids, etc.) []  - 0 Specimen(s) / Culture(s) sent or taken to Lab for analysis []  - 0 Patient Transfer (multiple staff / Civil Service fast streamer / Similar devices) []  - 0 Simple Staple / Suture removal (25 or less) []  - 0 Complex Staple / Suture removal (26 or more) []  - 0 Hypo / Hyperglycemic Management (close monitor of Blood Glucose) []  - 0 Ankle / Brachial Index (ABI) - do not check if billed separately X- 1 5 Vital Signs Has the patient been seen at the hospital within the last three years: Yes Total Score: 125 Level Of Care: New/Established - Level 4 Electronic Signature(s) Signed: 07/09/2019 5:13:38 PM By: Carlene Coria RN Entered By: Carlene Coria on 07/09/2019 12:18:57 -------------------------------------------------------------------------------- Encounter Discharge Information Details Patient Name: Date of Service: [name] [name] M. 07/09/2019 10:45 A M Medical Record Number: [id number] Patient Account Number: [id number] Date of Birth/Sex: Treating RN: 30-Jul-1955 (64 y.o. Orvan Falconer Primary Care [name] [name]: SYSTEM, PCP Other Clinician: Referring Adoria Kawamoto: Treating Malachi Suderman/Extender: Lily Peer in Treatment: 105 Encounter Discharge Information Items Discharge Condition: Stable Ambulatory Status: Wheelchair Discharge Destination: Home Transportation: Private Auto Accompanied By: self Schedule Follow-up Appointment: Yes Clinical Summary of Care: Electronic Signature(s) Signed: 07/09/2019 3:47:52 PM By: Deon Pilling Entered By: Deon Pilling on 07/09/2019 12:24:27 -------------------------------------------------------------------------------- Lower Extremity Assessment Details Patient Name: Date of Service: [name] [name] M. 07/09/2019 10:45 A M Medical Record Number: [id number] Patient Account Number: [id number] Date of Birth/Sex: Treating RN: [birth year] (64 y.o. Orvan Falconer Primary Care Atom Solivan: SYSTEM, PCP Other Clinician: Referring Britiney Blahnik: Treating Azoria Abbett/Extender: Lily Peer in Treatment: 105 Electronic Signature(s) Signed: 07/09/2019 3:47:52 PM By: Deon Pilling Signed: 07/09/2019 5:13:38 PM By: Carlene Coria RN Entered By: Deon Pilling on 07/09/2019 11:35:44 -------------------------------------------------------------------------------- Multi Wound Chart Details Patient Name: Date of Service: [name] [name] M. 07/09/2019 10:45 A M Medical Record Number: [id number] Patient Account Number: [id number] Date of Birth/Sex: Treating RN: 1955-05-28 (64 y.o. Orvan Falconer Primary Care Tristain Daily: SYSTEM, PCP Other Clinician: Referring Diamonte Stavely: Treating Akaila Rambo/Extender: Lily Peer in Treatment: 105 Vital Signs Height(in): 62 Pulse(bpm): 84 Weight(lbs): 260 Blood Pressure(mmHg): 166/71 Body Mass Index(BMI): 48 Temperature(F): 98.1 Respiratory Rate(breaths/min): 20 Photos: [3:No Photos Right Abdomen - Lower Quadrant] [4:No Photos Right Flank] [7:No Photos Right, Proximal Flank] Wound Location: [3:Gradually Appeared] [4:Gradually Appeared] [7:Gradually  Appeared] Wounding Event: [3:Dehisced Wound] [4:MASD Moisture Associated Skin] [7:Inflammatory] Primary Etiology: [3:Cataracts, Lymphedema, Sleep] [4:Damage N/A] [7:Cataracts, Lymphedema, Sleep] Comorbid History: [3:Apnea, Hypertension, Type II Diabetes, End Stage Renal Disease, Gout, Rheumatoid Arthritis, Neuropathy 12/07/2017] [4:05/31/2018] [7:Apnea, Hypertension, Type II Diabetes, End Stage Renal Disease, Gout, Rheumatoid Arthritis, Neuropathy  04/17/2019] Date Acquired: [3:81] [4:55] [7:11] Weeks of Treatment: [3:Open] [4:Healed - Epithelialized] [7:Open] Wound Status: [3:No] [4:Yes] [7:No] Clustered Wound: [3:5.5x2x0.1] [4:0x0x0] [7:0.5x0.8x0.1] Measurements L x W x D (  cm) [3:8.639] [4:0] [7:0.314] A (cm) : rea [3:0.864] [4:0] [7:0.031] Volume (cm) : [3:-33.30%] [4:100.00%] [7:-90.30%] % Reduction in Area: [3:85.20%] [4:100.00%] [7:-93.70%] % Reduction in Volume: [3:Full Thickness Without Exposed] [4:N/A] [7:Full Thickness Without Exposed] Classification: [3:Support Structures Medium] [4:N/A] [7:Support Structures Small] Exudate Amount: [3:Serosanguineous] [4:N/A] [7:Serosanguineous] Exudate Type: [3:red, brown] [4:N/A] [7:red, brown] Exudate Color: [3:Distinct, outline attached] [4:N/A] [7:Flat and Intact] Wound Margin: [3:Large (67-100%)] [4:N/A] [7:Large (67-100%)] Granulation Amount: [3:Pink] [4:N/A] [7:Red] Granulation Quality: [3:None Present (0%)] [4:N/A] [7:None Present (0%)] Necrotic Amount: [3:Fat Layer (Subcutaneous Tissue)] [4:N/A] [7:Fat Layer (Subcutaneous Tissue)] Exposed Structures: [3:Exposed: Yes Fascia: No Tendon: No Muscle: No Joint: No Bone: No Medium (34-66%)] [4:N/A] [7:Exposed: Yes Fascia: No Tendon: No Muscle: No Joint: No Bone: No Large (67-100%)] Treatment Notes Wound #3 (Right Abdomen - Lower Quadrant) 1. Cleanse With Wound Cleanser 3. Primary Dressing Applied Polymem Ag 4. Secondary Dressing ABD Pad Dry Gauze 5. Secured With Medipore tape Wound #7  (Right, Proximal Flank) 1. Cleanse With Wound Cleanser 3. Primary Dressing Applied Calcium Alginate 4. Secondary Dressing ABD Pad Drawtex 5. Secured With [employer]) Signed: 07/11/2019 12:52:55 PM By: Linton Ham MD Signed: 07/12/2019 9:36:08 AM By: Carlene Coria RN Entered By: Linton Ham on 07/10/2019 08:01:31 -------------------------------------------------------------------------------- Multi-Disciplinary Care Plan Details Patient Name: Date of Service: [name] [name] M. 07/09/2019 10:45 A M Medical Record Number: [id number] Patient Account Number: [id number] Date of Birth/Sex: Treating RN: [birth year] (64 y.o. Orvan Falconer Primary Care Broden Holt: SYSTEM, PCP Other Clinician: Referring Jamoni Broadfoot: Treating Jarom Govan/Extender: Lily Peer in Treatment: 105 Active Inactive Wound/Skin Impairment Nursing Diagnoses: Impaired tissue integrity Goals: Patient/caregiver will verbalize understanding of skin care regimen Date Initiated: 09/07/2017 Target Resolution Date: 08/21/2019 Goal Status: Active Ulcer/skin breakdown will have a volume reduction of 30% by week 4 Date Initiated: 06/29/2017 Date Inactivated: 08/04/2017 Target Resolution Date: 09/01/2017 Goal Status: Unmet Unmet Reason: larger Interventions: Assess patient/caregiver ability to obtain necessary supplies Assess patient/caregiver ability to perform ulcer/skin care regimen upon admission and as needed Assess ulceration(s) every visit Provide education on ulcer and skin care Notes: Electronic Signature(s) Signed: 07/09/2019 5:13:38 PM By: Carlene Coria RN Entered By: Carlene Coria on 07/09/2019 12:17:57 -------------------------------------------------------------------------------- Pain Assessment Details Patient Name: Date of Service: [name] [name] M. 07/09/2019 10:45 A M Medical Record Number: [id number] Patient Account Number: [id number] Date  of Birth/Sex: Treating RN: 1955-05-04 (64 y.o. Orvan Falconer Primary Care Sarita Hakanson: SYSTEM, PCP Other Clinician: Referring Mareta Chesnut: Treating Lorraina Spring/Extender: Lily Peer in Treatment: 105 Active Problems Location of Pain Severity and Description of Pain Patient Has Paino Yes Site Locations Pain Location: Generalized Pain, Pain in Ulcers Rate the pain. Current Pain Level: 7 Worst Pain Level: 10 Least Pain Level: 0 Tolerable Pain Level: 8 Pain Management and Medication Current Pain Management: Medication: No Cold Application: No Rest: No Massage: No Activity: No T.E.N.S.: No Heat Application: No Leg drop or elevation: No Is the Current Pain Management Adequate: Adequate How does your wound impact your activities of daily livingo Sleep: No Bathing: No Appetite: No Relationship With Others: No Bladder Continence: No Emotions: No Bowel Continence: No Work: No Toileting: No Drive: No Dressing: No Hobbies: No Electronic Signature(s) Signed: 07/09/2019 3:47:52 PM By: Deon Pilling Signed: 07/09/2019 5:13:38 PM By: Carlene Coria RN Entered By: Deon Pilling on 07/09/2019 11:35:36 -------------------------------------------------------------------------------- Patient/Caregiver Education Details Patient Name: Date of Service: [name] [name], [name] SEPHINE M. 5/18/2021andnbsp10:45 A M Medical Record Number: [id number] Patient Account Number:  [id number] Date of Birth/Gender: Treating RN: 1955-11-03 (65 y.o. Orvan Falconer Primary Care Physician: SYSTEM, PCP Other Clinician: Referring Physician: Treating Physician/Extender: Lily Peer in Treatment: 105 Education Assessment Education Provided To: Patient Education Topics Provided Wound/Skin Impairment: Methods: Explain/Verbal Responses: State content correctly Electronic Signature(s) Signed: 07/09/2019 5:13:38 PM By: Carlene Coria RN Entered By: Carlene Coria on  07/09/2019 11:10:12 -------------------------------------------------------------------------------- Wound Assessment Details Patient Name: Date of Service: [name] [name] M. 07/09/2019 10:45 A M Medical Record Number: [id number] Patient Account Number: [id number] Date of Birth/Sex: Treating RN: 03/29/1955 (64 y.o. Orvan Falconer Primary Care Irania Durell: SYSTEM, PCP Other Clinician: Referring Juston Goheen: Treating Dominga Mcduffie/Extender: Lily Peer in Treatment: 105 Wound Status Wound Number: 3 Primary Dehisced Wound Etiology: Wound Location: Right Abdomen - Lower Quadrant Wound Open Wounding Event: Gradually Appeared Status: Date Acquired: 12/07/2017 Comorbid Cataracts, Lymphedema, Sleep Apnea, Hypertension, Type II Weeks Of Treatment: 81 History: Diabetes, End Stage Renal Disease, Gout, Rheumatoid Arthritis, Clustered Wound: No Neuropathy Wound Measurements Length: (cm) 5.5 Width: (cm) 2 Depth: (cm) 0.1 Area: (cm) 8.639 Volume: (cm) 0.864 % Reduction in Area: -33.3% % Reduction in Volume: 85.2% Epithelialization: Medium (34-66%) Tunneling: No Undermining: No Wound Description Classification: Full Thickness Without Exposed Support Structures Wound Margin: Distinct, outline attached Exudate Amount: Medium Exudate Type: Serosanguineous Exudate Color: red, brown Foul Odor After Cleansing: No Slough/Fibrino No Wound Bed Granulation Amount: Large (67-100%) Exposed Structure Granulation Quality: Pink Fascia Exposed: No Necrotic Amount: None Present (0%) Fat Layer (Subcutaneous Tissue) Exposed: Yes Tendon Exposed: No Muscle Exposed: No Joint Exposed: No Bone Exposed: No Treatment Notes Wound #3 (Right Abdomen - Lower Quadrant) 1. Cleanse With Wound Cleanser 3. Primary Dressing Applied Polymem Ag 4. Secondary Dressing ABD Pad Dry Gauze 5. Secured With [employer]) Signed: 07/09/2019 3:47:52 PM By: Deon Pilling Signed: 07/09/2019 5:13:38 PM By: Carlene Coria RN Entered By: Deon Pilling on 07/09/2019 11:41:42 -------------------------------------------------------------------------------- Wound Assessment Details Patient Name: Date of Service: [name] [name] M. 07/09/2019 10:45 A M Medical Record Number: [id number] Patient Account Number: [id number] Date of Birth/Sex: Treating RN: 04-Nov-1955 (64 y.o. Orvan Falconer Primary Care Johnhenry Tippin: SYSTEM, PCP Other Clinician: Referring Jerson Furukawa: Treating Viral Schramm/Extender: Lily Peer in Treatment: 105 Wound Status Wound Number: 4 Primary Etiology: MASD Moisture Associated Skin Damage Wound Location: Right Flank Wound Status: Healed - Epithelialized Wounding Event: Gradually Appeared Date Acquired: 05/31/2018 Weeks Of Treatment: 55 Clustered Wound: Yes Wound Measurements Length: (cm) Width: (cm) Depth: (cm) Area: (cm) Volume: (cm) 0 % Reduction in Area: 100% 0 % Reduction in Volume: 100% 0 0 0 Electronic Signature(s) Signed: 07/09/2019 3:47:52 PM By: Deon Pilling Signed: 07/09/2019 5:13:38 PM By: Carlene Coria RN Entered By: Deon Pilling on 07/09/2019 11:41:52 -------------------------------------------------------------------------------- Wound Assessment Details Patient Name: Date of Service: [name] [name] M. 07/09/2019 10:45 A M Medical Record Number: [id number] Patient Account Number: [id number] Date of Birth/Sex: Treating RN: 22-May-1955 (64 y.o. Orvan Falconer Primary Care Hewitt Garner: SYSTEM, PCP Other Clinician: Referring Clemie General: Treating Mikyla Schachter/Extender: Lily Peer in Treatment: 105 Wound Status Wound Number: 7 Primary Inflammatory Etiology: Wound Location: Right, Proximal Flank Wound Open Wounding Event: Gradually Appeared Status: Date Acquired: 04/17/2019 Comorbid Cataracts, Lymphedema, Sleep Apnea, Hypertension, Type II Weeks Of Treatment:  11 History: Diabetes, End Stage Renal Disease, Gout, Rheumatoid Arthritis, Clustered Wound: No Neuropathy Wound Measurements Length: (cm) 0.5 Width: (cm) 0.8 Depth: (cm) 0.1 Area: (cm) 0.314 Volume: (cm) 0.031 %  Reduction in Area: -90.3% % Reduction in Volume: -93.7% Epithelialization: Large (67-100%) Tunneling: No Undermining: No Wound Description Classification: Full Thickness Without Exposed Support Structures Wound Margin: Flat and Intact Exudate Amount: Small Exudate Type: Serosanguineous Exudate Color: red, brown Wound Bed Granulation Amount: Large (67-100%) Granulation Quality: Red Necrotic Amount: None Present (0%) Foul Odor After Cleansing: No Slough/Fibrino No Exposed Structure Fascia Exposed: No Fat Layer (Subcutaneous Tissue) Exposed: Yes Tendon Exposed: No Muscle Exposed: No Joint Exposed: No Bone Exposed: No Treatment Notes Wound #7 (Right, Proximal Flank) 1. Cleanse With Wound Cleanser 3. Primary Dressing Applied Calcium Alginate 4. Secondary Dressing ABD Pad Drawtex 5. Secured With [employer]) Signed: 07/09/2019 3:47:52 PM By: Deon Pilling Signed: 07/09/2019 5:13:38 PM By: Carlene Coria RN Entered By: Deon Pilling on 07/09/2019 11:42:35 -------------------------------------------------------------------------------- Vitals Details Patient Name: Date of Service: [name] [name] M. 07/09/2019 10:45 A M Medical Record Number: [id number] Patient Account Number: [id number] Date of Birth/Sex: Treating RN: 28-Nov-1955 (64 y.o. Orvan Falconer Primary Care Zak Gondek: SYSTEM, PCP Other Clinician: Referring Mina Carlisi: Treating Misaki Sozio/Extender: Lily Peer in Treatment: 105 Vital Signs Time Taken: 11:30 Temperature (F): 98.1 Height (in): 62 Pulse (bpm): 84 Weight (lbs): 260 Respiratory Rate (breaths/min): 20 Body Mass Index (BMI): 47.5 Blood Pressure (mmHg): 166/71 Reference Range: 80  - 120 mg / dl Electronic Signature(s) Signed: 07/09/2019 3:47:52 PM By: Deon Pilling Entered By: Deon Pilling on 07/09/2019 11:35:14

## 2019-07-09 NOTE — Progress Notes (Signed)
BEDIE, DOMINEY ([id number]) Visit Report for 07/09/2019 Fall Risk Assessment Details Patient Name: Date of Service: Samara Deist [facility] M. 07/09/2019 10:45 A M Medical Record Number: [id number] Patient Account Number: [id number] Date of Birth/Sex: Treating RN: 13-Nov-1955 (64 y.o. Orvan Falconer Primary Care Reilly Molchan: SYSTEM, PCP Other Clinician: Referring Minh Jasper: Treating Phong Isenberg/Extender: Lily Peer in Treatment: 105 Fall Risk Assessment Items Have you had 2 or more falls in the last 12 monthso 0 Yes Have you had any fall that resulted in injury in the last 12 monthso 0 No FALLS RISK SCREEN History of falling - immediate or within 3 months 25 Yes Secondary diagnosis (Do you have 2 or more medical diagnoseso) 0 No Ambulatory aid None/bed rest/wheelchair/nurse 0 Yes Crutches/cane/walker 15 Yes Furniture 0 No Intravenous therapy Access/Saline/Heparin Lock 0 No Gait/Transferring Normal/ bed rest/ wheelchair 0 No Weak (short steps with or without shuffle, stooped but able to lift head while walking, may seek 10 Yes support from furniture) Impaired (short steps with shuffle, may have difficulty arising from chair, head down, impaired 0 No balance) Mental Status Oriented to own ability 0 Yes Electronic Signature(s) Signed: 07/09/2019 3:47:52 PM By: Deon Pilling Signed: 07/09/2019 5:13:38 PM By: Carlene Coria RN Entered By: Deon Pilling on 07/09/2019 11:33:03

## 2019-07-11 NOTE — Progress Notes (Signed)
THERA, BASDEN ([id number]) Visit Report for 07/09/2019 HPI Details Patient Name: Date of Service: [name] [name] [facility] M. 07/09/2019 10:45 A M Medical Record Number: [id number] Patient Account Number: [id number] Date of Birth/Sex: Treating RN: [birth year] (63 y.o. Orvan Falconer Primary Care Provider: SYSTEM, PCP Other Clinician: Referring Provider: Treating Provider/Extender: Lily Peer in Treatment: 105 History of Present Illness HPI Description: 06/29/17; this is a 64 year old woman who is a poor historian. It appears that she's had limited medical care over the last 2 years or so and what she had his through the [place]. She is here for review of a draining area on her right shoulder anteriorly as well as a nonhealing surgical wound in the left lower abdomen. Per her description she had a hysterectomy in 2006 4 "tumors". About 9 months later she had have another operation in the same area because of "tumors" involving her fallopian tubes and ovary. Sounds as though the surgical wound in this area that took a long time to heal. Apparently the area on her left lower abdomen is been open for the last 6 or 7 months. She is been applying topical antibiotics. 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 She had a fall in late 2018 and the open area on top of her shoulder. She is had draining pus coming out of this wound since she's been applying topical antibiotics and a Band-Aid. She is not complaining of a lot of pain or fever although she did have chills today. She saw her doctor at the [place] yesterday she doesn't know her name she apparently was referred to a surgeon at the [facility] and [place] presumably an orthopedic surgeon to look at that shoulder. She also has a primary doctor in [place] by the name of Dr. Jimmye Norman who referred her here after seeing her within the last month. 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 We don't have a lot of information in her in Epic. Last hospitalization was from 11/21/16 through 11/24/16 with hypoglycemia felt to be secondary to sulfonylureas. There is no mention of a wound at that time. 07/07/17; patient's shoulder cultured MSSA. I put her on clindamycin to had some anaerobic coverage with caution about diarrhea. I had asked her to make an orthopedic follow-up at the [place] with a doctor that made the original surgery but she has not done that yet. She also has a fairly large wound in the left lower abdomen in the middle of his surgical scar. This was apparently gynecologic surgery perhaps fibroid surgery. In any case these wounds are larger today. 07/21/17; the patient went to see the orthopedic doctor at the [facility] who did her original surgery surgery. Per the patient's description the draining area had closed down. He didn't think anything needed to be done there were no imaging studies. The lower abdominal wound in the left mid pelvis area also looks somewhat better to me. There is less surface material on this still some probing areas but not a lot of depth. We've been using silver alginate 08/03/17; the patient arrives back in 2 week follow-up. The draining sinus on her right shoulder has not reopened although she is complaining of widespread shoulder and other joint pain. She is using silver alginate to the remaining surgical wound on her abdomen. 08/18/17; the patient has 2 week follow-up appointment. The draining sinus on  her right shoulder is not reopened. She is complaining of left shoulder pain today. The presumed surgical wound on her lower abdomen looks quite a bit better and this is really contracted quite nicely. When she first came in the deep sinus she had on the anterior shoulder cultured MSSA. I gave her clindamycin. I arranged for orthopedic follow-up at the [facility] however this  is already closed nevertheless this was a deep probing tunnel and I think it needs to be watched for reopening. 09/07/17; surgical wound on her lower abdomen. This as 2 attached leaflets. The larger one is superiorly. A small probing area here with some sanguinous drainage. Surface needed debridement today. We've been using silver alginate however I changed this to silver collagen today 09/15/17. Surgical wound on her lower abdomen. No recent improvement although this is markedly improved from when we first started seeing her although the original problem was a wound on her right shoulder which is long since closed.switched her to Silver collagen last week She arrives in clinic today complaining of abdominal pain "so bad I almost went to the ER". She tells me she had an endoscopy and colonoscopy about a week ago. This was at the [place], I have no way to look at this. She is unaware of any biopsy was done 10/02/17 on evaluation today patient continues to do well in regard to her abdominal wound were using Prisma it's obviously taken some time for this to heal but nonetheless I do think that she is making good progress currently. There does not appear to be any evidence of infection at this time which is good news. She still does have some tunneling or undermining but again I think this is still doing well. 10/19/17; she continues to make good progress in this surgical abdominal wound using Prisma. This is come in nicely at one point had considerable depth. No evidence of infection. The patient is not complaining of pain in this area but is complaining of diabetic neuropathy pain in her right foot 11/02/17;the patient's wound is superficial and is really continued to make decent progress. I thought this would be more problematic when I first saw however this appears to a filled in nicely there is no depth. It exists in the crevice between what I think is lower abdominal hernias. She comes in complaining  of generalized pain however she is running out of her narcotics providedto her by a local pain clinic. She is asking for home health however she is [place] as I understand we cannot order this. 11/16/2017; 2-week follow-up. She has a lower abdominal surgical wound and this appears to be making nice progress closing in nicely each time we see it. This was initially a deep crevice surgical area between what I think is to lower abdominal hernias. In spite of this wound has healthy granulation. There does not appear to be any depth at all. There is probably a lot of tensile stress on the wound area however in spite of this things actually look better 11/30/2017; 2-week follow-up. She is complaining of left lower quadrant abdominal pain it is difficult to characterize this clearly episodic and not really associated with nausea or vomiting. She follows at the [place]. T me she was recently seen there by her new primary physician. The wound is in the lower ells abdomen in the middle of what I think was a gynecologic surgical site. We have made progress with this. 12/14/2017; 2-week follow-up. The patient comes in today with an improved wound  area on the lower mid abdomen surgical scar which was the wound we have been following her for. Paradoxically she has developed a new open area just to the right of the midline almost a mirror imaging wound on the right. She states this came up about a week ago. She does not have a clear explanation. Both of these wounds are in surgical scar areas although from her I think this surgery was remote. She denies scratching this or traumatizing it. It is surrounded by 2 large areas that I suspect are herniations but I still cannot see how this would have come from trauma. 12/28/2017; the area on the left surgical scar in the lower abdomen is better however the area on the right that we identified last time is worse. Increase in dimensions. There is no obvious infection here. We have  been using silver alginate. She tells me she has been to the ER at the [place] and they gave her doxycycline she also saw her primary doctor yesterday. I think she needs to be referred to dermatology consideration of a skin biopsy question panniculitis 01/11/2018; we continue to have improvement in the left lower mid abdominal wound however the area on the right that we identified in late October has gotten quite a bit worse. There is not appear to be surrounding infection although the patient is complaining of a lot of pain. We have been using silver alginate. [facility] I think this should help 01/24/18 on evaluation today patient actually apparently has two abdominal wounds the one on the right is new here and unfortunately seems to be doing a little bit worse and that it's a little bit larger. Fortunately there does not appear to be any sign of infection at this time. No fevers, chills, nausea, or vomiting noted at this time. Overall again it sounds like this is a fairly complex issue and despite the fact that we been using silver nitrate in the [facility] Iv Dressing still has some hyper granulation. No fevers, chills, nausea, or vomiting noted at this time. 01/31/18 plan evaluation today patient appears to be doing better in general in regard to her wounds. She has been tolerating the dressing changes without complication. I do feel like that right now she's making some progress so she still is having some discomfort. 02/07/18 on evaluation today patient actually appears to be doing rather well in regard to her double ulcers as far as I'm concerned. With that being said she is somewhat frustrated in general in regard to the overall appearance of everything. I feel like this is unwarranted however since she seems to be making progress. 02/15/2018; lower mid abdomen right much greater than left although the left was the original wound. The area on  the left looks really quite good and is smaller. The area on the right seems longer and measures worse in terms of dimensions. These areas are in the most dependent part of her large abdominal pannus and this may be the entire issue. at [facility] on the left there is lymphedema around the wound. The patient is seeing dermatology next week apparently in [place] 03/01/2017; she likely had abdomen. Right now he other than left ankle both he has better today. I think both of these areas are smaller. Surface looks healthier she is due to see dermatology later this month. Using [facility] 1/23; abdominal wounds in the lower mid abdomen. This is in a very dependent area  in her lower abdominal pannus and the simple tension in this area may have a lot to do with this. She also has constant chronic pruritus and scratching and I wonder if this has something to do with open wounds in this area. She tells me her daughter is helping her with the dressings currently and the original wound on the left side of her mid abdomen is better almost closed the larger area on the right also looks some better. She sees a dermatologist next week I have told her to ask the dermatologist for help with generalized pruritus. The probable skin wounds that she has all over secondary to scratching. The wounds we have been treating may be related to simple tension in a dependent lymphedematous area. She could benefit from an abdominal binder to help support this area 2/13; abdominal wounds in the lower mid abdomen. Very dependent in her abdomen and this may be secondary to tension in the lower part of her abdominal pannus plus or minus edema. 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 She claims not to be scratching at the wound areas. 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 They give her gentian violet to put on the wound area but this is already present and Hydrofera Blue. The original wound on the left is healed today. However the area on the right part of her mid abdominal pannus appears larger. We are going to try to work with her on getting an abdominal binder to help support the weight of the pannus around the wounds 3/9; abdominal wound in the lower mid abdomen. She apparently has an abdominal binder that she bought on [employer]. She is using Hydrofera Blue to the wound on the right of the midline. The original wound was on the left and is closed over. 3/26; abdominal wound in the lower mid abdomen. This is her second wound in this area. The lower part of her abdominal pannus surrounding surgical scar. Once again she is not wearing the abdominal binder she said she obtained. The dressing is Hydrofera Blue 4/16; abdomen in the right lower mid abdomen. This is the second wound in this area the other was a mirror image area on the left. I had her seen by dermatology at the [facility] this was not very helpful. She is not wearing her abdominal binder. We have been using Hydrofera Blue which is what healed at the other side. The abdominal wound is actually larger She arrives in clinic today complaining of the plantar right heel. Very tender. She has been pushing herself up in her bed with her heels and this may be a deep tissue injury. 4/23; abdomen wound is about the same. Spreading along the folds of her pannus somewhat but generally the surface looks somewhat better. She is not using her abdominal binder although she did bring it in this time She arrives in clinic complaining of pain and itching in the right lateral rib area. She has 3 open areas here which are circular wounds. These are new She also mentions her lower sacrum and there are 2 wounds in this area. These are new 5/7; abdominal wound is about  the same although in general it looks better than a few weeks ago at which time it had punched out depth. No debridement is  required. She will not wear her abdominal binder She has areas on her right lateral rib cage 3 open areas circular wounds these look about the same as last time. I am assuming these have something to do with the prurigo nodularis that she may have scratched but I am not certain. Finally she has a linear area on her lower sacrum which I am assuming is a pressure area 5/21-Patient returns at 2 weeks for abdominal wound, areas on the right lateral rib cage, and lower sacrum linear area which is a pressure sore. We have been using silver alginate to all these wounds. She has been asked to wear abdominal binder which she is not. she has 2 new wounds on right flank area and on her coccyx. she has a total of 6 wounds 6/9; I have been following this patient for a prolonged period of time for wounds on her lower mid abdomen which I think were secondary to severe lymphedema. She had an area on the left of the midline that eventually healed only to open on the right side. This is still open we have been using silver alginate. She will not use an abdominal binder. About a month ago she arrived here with open areas on the right lateral ribs. This is in the middle of a large amount of soft tissue/pannus fold. She has 4 open areas here at this time. Finally she has 2 open areas in her lower coccyx. We have been using silver alginate on all the wound areas 6/25; we are following this woman for wounds on her lower mid abdomen in the setting of a pannus and severe dependent lymphedema. More recently she has had areas in the folds on the right lateral rib area. Culture I did of this area last time was negative. Finally she has an area on the coccyx. We have been using silver alginate to all the wound areas 7/9; this is a patient with morbid obesity severe lymphedema in her lower abdominal  pannus/severe dependent abdominal lymphedema. She first came to this clinic with an area on the left lower central abdomen. This eventually healed over. About the same time as it did heal over she developed an area in close juxtaposition on the other side of the midline. We have been following this for some months when she was here last time I thought things were improving although she a lot arrives today with a larger wound. She will not use her external abdominal binder. Over the last several weeks she has had areas on her right lateral rib area. The exact etiology of this is unclear we have been using silver alginate. She arrives today with the entire area inflamed angry with loss of surface epithelium and marked pruritus. I think this is tinea/Candida. This could be secondary or the primary in the etiology of these expanding areas. 7/23; the area on the right lateral rib areas is quite a bit better. Skin that we treated with oral and topical antifungals looks a lot better around this area. Unfortunately the lower abdominal area actually measures larger. There is no drainage here really no surrounding erythema. 8/6-Patient arrives to the clinic at 3 weeks, the right lateral rib cage area has copious drainage but the wounds look a little better, the abdominal midline wound unfortunately is larger, she is not able to use the binder both wounds have a clean base 8/20. Since the patient was last here she was hospitalized from 813 through 818. This was predominantly because of  hyperkalemia secondary to lisinopril and dehydration. Lisinopril was discontinued at discharge. She was also felt to have mild cellulitis around several skin ulcers she was given antibiotics in the hospital but not discharged on antibiotics. Also noted that she has stage IV chronic kidney disease with a baseline creatinine at 1.7. 9/3 the patient still has a 2 wound areas lower mid abdominal pannus and the right lateral ribs. We  have been using polymen to the abdominal wound and silver alginate to the area on the folds on her right lateral ribs. She is complaining about pruritus around the areas on the ribs. 9/17; 2-week follow-up. The area on the mid lower abdomen better using PolyMem. She has 4 small areas in the pannus fold on the right lateral ribs and is a new area on the left lateral lower abdominal folds this week. 10/1; 2-week follow-up. She has been using PolyMem on the mid lower abdomen and really not much improvement. She has this for small areas in the pannus fold of the right lateral ribs. These are gradually I think improving. The area that was new last time on the left lower abdominal pannus has closed over. 10/15; 2-week follow-up. I changed to silver collagen on the mid lower abdomen wound which seems smaller today. She is still using silver alginate on the right lateral ribs and a pannus fold and this looks better today although she is complaining of itching in this area She has had an outbreak on her face which almost looks like cystic acne. She is going to see her primary doctor about this tomorrow 11/5; silver collagen on the mid lower abdomen which seems to be doing a good job we are using silver alginate in the pannus folds on her right lateral ribs. She is complaining of pruritus in this area which is probably intertrigo/Candida. I will prescribe nystatin powder 12/3. We are using silver collagen to her mid lower abdomen which looks better. The pannus area on the right lateral ribs we are using silver alginate she is pointing nystatin powder in here. The wounds look better. She is separating the folds with hand towels. We have suggested Interdry and she is going to look into getting that online 12/28; 1 month follow-up. In general the area on her mid lower abdomen looks a lot better. Surface area is smaller we have been using silver collagen. The pannus area on the right lateral ribs we have been using  alginate. She also is putting Interdry in these areas. 1/25; 1 month follow-up. The area on her abdomen is about the same. We have been using silver collagen here. The areas on her right thorax and the pannus folds in this area are down to 3 small wounds we have been using silver alginate here 3/1; the area on her pannus of the lower abdomen on the right of midline is about the same. We have been using silver collagen for a protracted period and previously we have used Hydrofera Blue. The area is in a pannus fold in her right lateral thorax actually are smaller. There are 3 wounds in this area. She is using calcium alginate here. She is complaining of increasing ankle edema she saw her primary doctor at the [place] in [place] and had her Lasix increased otherwise she has no additional new complaint 4/8; the area on the pannus of her lower abdomen looks somewhat better. We have been using polymen. The area in the pannus in her right lateral thorax are down to 2 small  open areas. We have been using calcium alginate 5/18; monthly follow-up visit. The wound on the lower abdomen actually looks some better. We changed her to polymen last time. We have been using alginate in the areas in the pannus folds on her right lateral thorax 1 of these is healed everything else looks a lot better. Electronic Signature(s) Signed: 07/11/2019 12:52:55 PM By: Linton Ham MD Entered By: Linton Ham on 07/10/2019 08:04:46 -------------------------------------------------------------------------------- Physical Exam Details Patient Name: Date of Service: Marcy Siren M. 07/09/2019 10:45 A M Medical Record Number: [id number] Patient Account Number: [id number] Date of Birth/Sex: Treating RN: Feb 14, 1956 (64 y.o. Orvan Falconer Primary Care Provider: SYSTEM, PCP Other Clinician: Referring Provider: Treating Provider/Extender: Lily Peer in Treatment:  105 Constitutional Patient is hypertensive.. Pulse regular and within target range for patient.Marland Kitchen Respirations regular, non-labored and within target range.. Temperature is normal and within the target range for the patient.Marland Kitchen Appears in no distress. Gastrointestinal (GI) Massively obese. Lymphedema in the skin and lower abdominal pannus which is dependent. Integumentary (Hair, Skin) Multiple healed areas on her skin diffusely which previously has been diagnosed as prurigo nodularis. Other than this there is no obvious infection. Notes Wound exam; midline lower abdominal wall on the right. I think this is considerably better very healthy looking tissue. No debridement is necessary in this area. The areas on the right lateral thorax are also quite a bit better. 1 of these areas is healed. This was at 1 point a large wound area complicated by maceration and likely tinea/candidal infection. I see no evidence of this. Electronic Signature(s) Signed: 07/11/2019 12:52:55 PM By: Linton Ham MD Entered By: Linton Ham on 07/10/2019 08:07:11 -------------------------------------------------------------------------------- Physician Orders Details Patient Name: Date of Service: Marcy Siren M. 07/09/2019 10:45 A M Medical Record Number: [id number] Patient Account Number: [id number] Date of Birth/Sex: Treating RN: 1955/05/14 (64 y.o. 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 Dressing Change Frequency Other: - all wounds - change dressing twice a week. Skin Barriers/Peri-Wound Care Skin Prep Wound Cleansing May shower and wash wound with soap and water. Primary Wound Dressing Wound #3 Right Abdomen - Lower Quadrant Polymem Silver Wound #4 Right Flank Calcium Alginate Wound #7 Right,Proximal Flank Calcium Alginate Secondary Dressing Wound #3 Right Abdomen - Lower Quadrant Dry Gauze ABD pad - secure with tape Wound #4 Right Flank ABD pad Drawtex Wound #7 Right,Proximal Flank ABD pad Drawtex Off-Loading Turn and reposition every 2 hours Other: - abdominal binder to support abdomen. Patient to float right heel while in chair or bed with pillow. Patient to keep pressure off right heel as much as possible. Roll up a face cloth or a pillowcase to separate all skin folds- under breast, abdomen, both flank areas. Additional Orders / Instructions Other: - ***[facility] INTERDRY 10"x 12 feet roll or patient to purchase offline from [employer].*** [place] [facility] for wound care. - [facility] Electronic Signature(s) Signed: 07/09/2019  5:13:38 PM By: Carlene Coria RN Signed: 07/11/2019 12:52:55 PM By: Linton Ham MD Entered By: Carlene Coria on 07/09/2019 11:09:40 -------------------------------------------------------------------------------- Problem List Details Patient Name: Date of Service: Marcy Siren M. 07/09/2019 10:45 A M Medical Record Number: [id number] Patient Account Number: [id number] Date of Birth/Sex: Treating RN: [birth year] (64 y.o. Orvan Falconer Primary Care Provider: SYSTEM, PCP Other Clinician: Referring Provider: Treating Provider/Extender: Lily Peer in Treatment: 105 Active  Problems ICD-10 Encounter Code Description Active Date MDM Diagnosis T81.31XD Disruption of external operation (surgical) wound, not elsewhere classified, 06/29/2017 No Yes subsequent encounter S31.104D Unspecified open wound of abdominal wall, left lower quadrant without 06/29/2017 No Yes penetration into peritoneal cavity, subsequent encounter L98.498 Non-pressure chronic ulcer of skin of other sites with other specified severity 06/14/2018 No Yes I89.0 Lymphedema, not elsewhere classified 08/30/2018 No Yes B35.4 Tinea corporis 08/30/2018 No Yes Inactive Problems ICD-10 Code Description Active Date Inactive Date M71.011 Abscess of bursa, right shoulder 06/29/2017 06/29/2017 L89.616 Pressure-induced deep tissue damage of right heel 06/07/2018 06/07/2018 L89.150 Pressure ulcer of sacral region, unstageable 06/14/2018 06/14/2018 Resolved Problems Electronic Signature(s) Signed: 07/11/2019 12:52:55 PM By: Linton Ham MD Previous Signature: 07/09/2019 5:13:38 PM Version By: Carlene Coria RN Entered By: Linton Ham on 07/10/2019 08:01:00 -------------------------------------------------------------------------------- Progress Note Details Patient Name: Date of Service: Marcy Siren M. 07/09/2019 10:45 A M Medical Record Number: [id number] Patient Account Number: [id number] Date of Birth/Sex: Treating RN: 09-14-1955 (64 y.o. Orvan Falconer Primary Care Provider: SYSTEM, PCP Other Clinician: Referring Provider: Treating Provider/Extender: Lily Peer in Treatment: 105 Subjective History of Present Illness (HPI) 06/29/17; this is a 64 year old woman who is a poor historian. It appears that she's had limited medical care over the last 2 years or so and what she had his through the [place]. She is here for review of a draining area on her right shoulder anteriorly as well as a nonhealing surgical wound in the left lower abdomen. Per her description she had a hysterectomy  in 2006 4 "tumors". About 9 months later she had have another operation in the same area because of "tumors" involving her fallopian tubes and ovary. Sounds as though the surgical wound in this area that took a long time to heal. Apparently the area on her left lower abdomen is been open for the last 6 or 7 months. She is been applying topical antibiotics. 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 She had a fall in late 2018 and the open area on top of her shoulder. She is had draining pus coming out of this wound since she's been applying topical antibiotics and a Band-Aid. She is not complaining of a lot of pain or fever although she did have chills today. She saw her doctor at the [place] yesterday she doesn't know her name she apparently was referred to a surgeon at the [facility] and [place] presumably an orthopedic surgeon to look at that shoulder. She also has a primary doctor in [place] by the name of Dr. Jimmye Norman who referred her here after seeing her within the last month. 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 We don't have a lot of information in her in Epic. Last hospitalization was from 11/21/16 through 11/24/16 with hypoglycemia felt to be secondary to sulfonylureas. There is no mention of a wound at that time. 07/07/17; patient's  shoulder cultured MSSA. I put her on clindamycin to had some anaerobic coverage with caution about diarrhea. I had asked her to make an orthopedic follow-up at the [place] with a doctor that made the original surgery but she has not done that yet. ooShe also has a fairly large wound in the left lower abdomen in the middle of his surgical scar. This was apparently gynecologic surgery perhaps fibroid surgery. In any case these wounds are larger today. 07/21/17; the patient went to see the orthopedic doctor at the [facility] who did her  original surgery surgery. Per the patient's description the draining area had closed down. He didn't think anything needed to be done there were no imaging studies. ooThe lower abdominal wound in the left mid pelvis area also looks somewhat better to me. There is less surface material on this still some probing areas but not a lot of depth. We've been using silver alginate 08/03/17; the patient arrives back in 2 week follow-up. The draining sinus on her right shoulder has not reopened although she is complaining of widespread shoulder and other joint pain. She is using silver alginate to the remaining surgical wound on her abdomen. 08/18/17; the patient has 2 week follow-up appointment. The draining sinus on her right shoulder is not reopened. She is complaining of left shoulder pain today. The presumed surgical wound on her lower abdomen looks quite a bit better and this is really contracted quite nicely. When she first came in the deep sinus she had on the anterior shoulder cultured MSSA. I gave her clindamycin. I arranged for orthopedic follow-up at the [facility] however this is already closed nevertheless this was a deep probing tunnel and I think it needs to be watched for reopening. 09/07/17; surgical wound on her lower abdomen. This as 2 attached leaflets. The larger one is superiorly. A small probing area here with some sanguinous drainage. Surface needed debridement today. We've been using silver alginate however I changed this to silver collagen today 09/15/17. Surgical wound on her lower abdomen. No recent improvement although this is markedly improved from when we first started seeing her although the original problem was a wound on her right shoulder which is long since closed.switched her to Silver collagen last week She arrives in clinic today complaining of abdominal pain "so bad I almost went to the ER". She tells me she had an endoscopy and colonoscopy about a week ago. This was at the [place], I  have no way to look at this. She is unaware of any biopsy was done 10/02/17 on evaluation today patient continues to do well in regard to her abdominal wound were using Prisma it's obviously taken some time for this to heal but nonetheless I do think that she is making good progress currently. There does not appear to be any evidence of infection at this time which is good news. She still does have some tunneling or undermining but again I think this is still doing well. 10/19/17; she continues to make good progress in this surgical abdominal wound using Prisma. This is come in nicely at one point had considerable depth. No evidence of infection. The patient is not complaining of pain in this area but is complaining of diabetic neuropathy pain in her right foot 11/02/17;the patient's wound is superficial and is really continued to make decent progress. I thought this would be more problematic when I first saw however this appears to a filled in nicely there is no depth. It exists in the  crevice between what I think is lower abdominal hernias. She comes in complaining of generalized pain however she is running out of her narcotics providedto her by a local pain clinic. She is asking for home health however she is [place] as I understand we cannot order this. 11/16/2017; 2-week follow-up. She has a lower abdominal surgical wound and this appears to be making nice progress closing in nicely each time we see it. This was initially a deep crevice surgical area between what I think is to lower abdominal hernias. In spite of this wound has healthy granulation. There does not appear to be any depth at all. There is probably a lot of tensile stress on the wound area however in spite of this things actually look better 11/30/2017; 2-week follow-up. She is complaining of left lower quadrant abdominal pain it is difficult to characterize this clearly episodic and not really associated with nausea or vomiting. She follows  at the [place]. T me she was recently seen there by her new primary physician. The wound is in the lower ells abdomen in the middle of what I think was a gynecologic surgical site. We have made progress with this. 12/14/2017; 2-week follow-up. The patient comes in today with an improved wound area on the lower mid abdomen surgical scar which was the wound we have been following her for. Paradoxically she has developed a new open area just to the right of the midline almost a mirror imaging wound on the right. She states this came up about a week ago. She does not have a clear explanation. Both of these wounds are in surgical scar areas although from her I think this surgery was remote. She denies scratching this or traumatizing it. It is surrounded by 2 large areas that I suspect are herniations but I still cannot see how this would have come from trauma. 12/28/2017; the area on the left surgical scar in the lower abdomen is better however the area on the right that we identified last time is worse. Increase in dimensions. There is no obvious infection here. We have been using silver alginate. She tells me she has been to the ER at the [place] and they gave her doxycycline she also saw her primary doctor yesterday. I think she needs to be referred to dermatology consideration of a skin biopsy question panniculitis 01/11/2018; we continue to have improvement in the left lower mid abdominal wound however the area on the right that we identified in late October has gotten quite a bit worse. There is not appear to be surrounding infection although the patient is complaining of a lot of pain. We have been using silver alginate. [facility] I think this should help 01/24/18 on evaluation today patient actually apparently has two abdominal wounds the one on the right is new here and unfortunately seems to be doing a little bit worse and that it's a little bit  larger. Fortunately there does not appear to be any sign of infection at this time. No fevers, chills, nausea, or vomiting noted at this time. Overall again it sounds like this is a fairly complex issue and despite the fact that we been using silver nitrate in the [facility] Dressing still has some hyper granulation. No fevers, chills, nausea, or vomiting noted at this time. 01/31/18 plan evaluation today patient appears to be doing better in general in regard to her wounds. She has been tolerating the dressing changes without  complication. I do feel like that right now she's making some progress so she still is having some discomfort. 02/07/18 on evaluation today patient actually appears to be doing rather well in regard to her double ulcers as far as I'm concerned. With that being said she is somewhat frustrated in general in regard to the overall appearance of everything. I feel like this is unwarranted however since she seems to be making progress. 02/15/2018; lower mid abdomen right much greater than left although the left was the original wound. The area on the left looks really quite good and is smaller. The area on the right seems longer and measures worse in terms of dimensions. These areas are in the most dependent part of her large abdominal pannus and this may be the entire issue. at [facility] on the left there is lymphedema around the wound. The patient is seeing dermatology next week apparently in [place] 03/01/2017; she likely had abdomen. Right now he other than left ankle both he has better today. I think both of these areas are smaller. Surface looks healthier she is due to see dermatology later this month. Using [facility] 1/23; abdominal wounds in the lower mid abdomen. This is in a very dependent area in her lower abdominal pannus and the simple tension in this area may have a lot to do with this. She also has constant chronic pruritus and scratching and I wonder if this  has something to do with open wounds in this area. She tells me her daughter is helping her with the dressings currently and the original wound on the left side of her mid abdomen is better almost closed the larger area on the right also looks some better. She sees a dermatologist next week I have told her to ask the dermatologist for help with generalized pruritus. The probable skin wounds that she has all over secondary to scratching. The wounds we have been treating may be related to simple tension in a dependent lymphedematous area. She could benefit from an abdominal binder to help support this area 2/13; abdominal wounds in the lower mid abdomen. Very dependent in her abdomen and this may be secondary to tension in the lower part of her abdominal pannus plus or minus edema. 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 She claims not to be scratching at the wound areas. 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 They give her gentian violet to put on the wound area but this is already present and Hydrofera Blue. The original wound on the left is healed today. However the area on the right part of her mid abdominal pannus appears larger. We are going to try to work with her on getting an abdominal binder to help support the weight of the pannus around the wounds 3/9; abdominal wound in the lower mid abdomen. She apparently has an abdominal binder that she bought on [employer]. She is using Hydrofera Blue to the wound on the right of the midline. The original wound was on the left and is closed over. 3/26; abdominal wound in the lower mid abdomen. This is her second wound in this area. The lower part of her abdominal pannus surrounding surgical scar. Once again she is not wearing the  abdominal binder she said she obtained. The dressing is  Hydrofera Blue 4/16; abdomen in the right lower mid abdomen. This is the second wound in this area the other was a mirror image area on the left. I had her seen by dermatology at the [facility] this was not very helpful. She is not wearing her abdominal binder. We have been using Hydrofera Blue which is what healed at the other side. The abdominal wound is actually larger She arrives in clinic today complaining of the plantar right heel. Very tender. She has been pushing herself up in her bed with her heels and this may be a deep tissue injury. 4/23; abdomen wound is about the same. Spreading along the folds of her pannus somewhat but generally the surface looks somewhat better. She is not using her abdominal binder although she did bring it in this time ooShe arrives in clinic complaining of pain and itching in the right lateral rib area. She has 3 open areas here which are circular wounds. These are new ooShe also mentions her lower sacrum and there are 2 wounds in this area. These are new 5/7; abdominal wound is about the same although in general it looks better than a few weeks ago at which time it had punched out depth. No debridement is required. She will not wear her abdominal binder ooShe has areas on her right lateral rib cage 3 open areas circular wounds these look about the same as last time. I am assuming these have something to do with the prurigo nodularis that she may have scratched but I am not certain. Finally she has a linear area on her lower sacrum which I am assuming is a pressure area 5/21-Patient returns at 2 weeks for abdominal wound, areas on the right lateral rib cage, and lower sacrum linear area which is a pressure sore. We have been using silver alginate to all these wounds. She has been asked to wear abdominal binder which she is not. she has 2 new wounds on right flank area and on her coccyx. she has a total of 6  wounds 6/9; I have been following this patient for a prolonged period of time for wounds on her lower mid abdomen which I think were secondary to severe lymphedema. She had an area on the left of the midline that eventually healed only to open on the right side. This is still open we have been using silver alginate. She will not use an abdominal binder. About a month ago she arrived here with open areas on the right lateral ribs. This is in the middle of a large amount of soft tissue/pannus fold. She has 4 open areas here at this time. Finally she has 2 open areas in her lower coccyx. We have been using silver alginate on all the wound areas 6/25; we are following this woman for wounds on her lower mid abdomen in the setting of a pannus and severe dependent lymphedema. More recently she has had areas in the folds on the right lateral rib area. Culture I did of this area last time was negative. Finally she has an area on the coccyx. We have been using silver alginate to all the wound areas 7/9; this is a patient with morbid obesity severe lymphedema in her lower abdominal pannus/severe dependent abdominal lymphedema. She first came to this clinic with an area on the left lower central abdomen. This eventually healed over. About the same time as it did heal over she developed an area in close juxtaposition on the other side of the  midline. We have been following this for some months when she was here last time I thought things were improving although she a lot arrives today with a larger wound. She will not use her external abdominal binder. Over the last several weeks she has had areas on her right lateral rib area. The exact etiology of this is unclear we have been using silver alginate. She arrives today with the entire area inflamed angry with loss of surface epithelium and marked pruritus. I think this is tinea/Candida. This could be secondary or the primary in the etiology of these expanding  areas. 7/23; the area on the right lateral rib areas is quite a bit better. Skin that we treated with oral and topical antifungals looks a lot better around this area. Unfortunately the lower abdominal area actually measures larger. There is no drainage here really no surrounding erythema. 8/6-Patient arrives to the clinic at 3 weeks, the right lateral rib cage area has copious drainage but the wounds look a little better, the abdominal midline wound unfortunately is larger, she is not able to use the binder both wounds have a clean base 8/20. Since the patient was last here she was hospitalized from 813 through 818. This was predominantly because of hyperkalemia secondary to lisinopril and dehydration. Lisinopril was discontinued at discharge. She was also felt to have mild cellulitis around several skin ulcers she was given antibiotics in the hospital but not discharged on antibiotics. Also noted that she has stage IV chronic kidney disease with a baseline creatinine at 1.7. 9/3 the patient still has a 2 wound areas lower mid abdominal pannus and the right lateral ribs. We have been using polymen to the abdominal wound and silver alginate to the area on the folds on her right lateral ribs. She is complaining about pruritus around the areas on the ribs. 9/17; 2-week follow-up. The area on the mid lower abdomen better using PolyMem. She has 4 small areas in the pannus fold on the right lateral ribs and is a new area on the left lateral lower abdominal folds this week. 10/1; 2-week follow-up. She has been using PolyMem on the mid lower abdomen and really not much improvement. She has this for small areas in the pannus fold of the right lateral ribs. These are gradually I think improving. The area that was new last time on the left lower abdominal pannus has closed over. 10/15; 2-week follow-up. I changed to silver collagen on the mid lower abdomen wound which seems smaller today. She is still using  silver alginate on the right lateral ribs and a pannus fold and this looks better today although she is complaining of itching in this area She has had an outbreak on her face which almost looks like cystic acne. She is going to see her primary doctor about this tomorrow 11/5; silver collagen on the mid lower abdomen which seems to be doing a good job we are using silver alginate in the pannus folds on her right lateral ribs. She is complaining of pruritus in this area which is probably intertrigo/Candida. I will prescribe nystatin powder 12/3. We are using silver collagen to her mid lower abdomen which looks better. The pannus area on the right lateral ribs we are using silver alginate she is pointing nystatin powder in here. The wounds look better. She is separating the folds with hand towels. We have suggested Interdry and she is going to look into getting that online 12/28; 1 month follow-up. In general the  area on her mid lower abdomen looks a lot better. Surface area is smaller we have been using silver collagen. The pannus area on the right lateral ribs we have been using alginate. She also is putting Interdry in these areas. 1/25; 1 month follow-up. The area on her abdomen is about the same. We have been using silver collagen here. The areas on her right thorax and the pannus folds in this area are down to 3 small wounds we have been using silver alginate here 3/1; the area on her pannus of the lower abdomen on the right of midline is about the same. We have been using silver collagen for a protracted period and previously we have used Hydrofera Blue. ooThe area is in a pannus fold in her right lateral thorax actually are smaller. There are 3 wounds in this area. She is using calcium alginate here. She is complaining of increasing ankle edema she saw her primary doctor at the [place] in [place] and had her Lasix increased otherwise she has no additional new complaint 4/8; the area on the  pannus of her lower abdomen looks somewhat better. We have been using polymen. The area in the pannus in her right lateral thorax are down to 2 small open areas. We have been using calcium alginate 5/18; monthly follow-up visit. The wound on the lower abdomen actually looks some better. We changed her to polymen last time. We have been using alginate in the areas in the pannus folds on her right lateral thorax 1 of these is healed everything else looks a lot better. Objective Constitutional Patient is hypertensive.. Pulse regular and within target range for patient.Marland Kitchen Respirations regular, non-labored and within target range.. Temperature is normal and within the target range for the patient.Marland Kitchen Appears in no distress. Vitals Time Taken: 11:30 AM, Height: 62 in, Weight: 260 lbs, BMI: 47.5, Temperature: 98.1 F, Pulse: 84 bpm, Respiratory Rate: 20 breaths/min, Blood Pressure: 166/71 mmHg. Gastrointestinal (GI) Massively obese. Lymphedema in the skin and lower abdominal pannus which is dependent. General Notes: Wound exam; midline lower abdominal wall on the right. I think this is considerably better very healthy looking tissue. No debridement is necessary in this area. ooThe areas on the right lateral thorax are also quite a bit better. 1 of these areas is healed. This was at 1 point a large wound area complicated by maceration and likely tinea/candidal infection. I see no evidence of this. Integumentary (Hair, Skin) Multiple healed areas on her skin diffusely which previously has been diagnosed as prurigo nodularis. Other than this there is no obvious infection. Wound #3 status is Open. Original cause of wound was Gradually Appeared. The wound is located on the Right Abdomen - Lower Quadrant. The wound measures 5.5cm length x 2cm width x 0.1cm depth; 8.639cm^2 area and 0.864cm^3 volume. There is Fat Layer (Subcutaneous Tissue) Exposed exposed. There is no tunneling or undermining noted. There is a  medium amount of serosanguineous drainage noted. The wound margin is distinct with the outline attached to the wound base. There is large (67-100%) pink granulation within the wound bed. There is no necrotic tissue within the wound bed. Wound #4 status is Healed - Epithelialized. Original cause of wound was Gradually Appeared. The wound is located on the Right Flank. The wound measures 0cm length x 0cm width x 0cm depth; 0cm^2 area and 0cm^3 volume. Wound #7 status is Open. Original cause of wound was Gradually Appeared. The wound is located on the Right,Proximal Flank. The wound measures  0.5cm length x 0.8cm width x 0.1cm depth; 0.314cm^2 area and 0.031cm^3 volume. There is Fat Layer (Subcutaneous Tissue) Exposed exposed. There is no tunneling or undermining noted. There is a small amount of serosanguineous drainage noted. The wound margin is flat and intact. There is large (67-100%) red granulation within the wound bed. There is no necrotic tissue within the wound bed. Assessment Active Problems ICD-10 Disruption of external operation (surgical) wound, not elsewhere classified, subsequent encounter Unspecified open wound of abdominal wall, left lower quadrant without penetration into peritoneal cavity, subsequent encounter Non-pressure chronic ulcer of skin of other sites with other specified severity Lymphedema, not elsewhere classified Tinea corporis Plan Follow-up Appointments: Return appointment in 1 month. Dressing Change Frequency: Other: - all wounds - change dressing twice a week. Skin Barriers/Peri-Wound Care: Skin Prep Wound Cleansing: May shower and wash wound with soap and water. Primary Wound Dressing: Wound #3 Right Abdomen - Lower Quadrant: Polymem Silver Wound #4 Right Flank: Calcium Alginate Wound #7 Right,Proximal Flank: Calcium Alginate Secondary Dressing: Wound #3 Right Abdomen - Lower Quadrant: Dry Gauze ABD pad - secure with tape Wound #4 Right Flank: ABD  pad Drawtex Wound #7 Right,Proximal Flank: ABD pad Drawtex Off-Loading: Turn and reposition every 2 hours Other: - abdominal binder to support abdomen. Patient to float right heel while in chair or bed with pillow. Patient to keep pressure off right heel as much as possible. Roll up a face cloth or a pillowcase to separate all skin folds- under breast, abdomen, both flank areas. Additional Orders / Instructions: Other: - ***[facility] INTERDRY 10"x 12 feet roll or patient to purchase offline from [employer].*** Home Health: [facility] for wound care. - [facility] 1. Both of her wound areas look better. 2. The right midline lower abdominal area which I think is largely a wound secondary to lymphedema looks better measure smaller very healthy surface. We changed to polymen silver last visit 5 weeks ago this is improved. Noteworthy that she had a area on just the left side of this midline area [mirror-image] when she first came to this clinic this eventually closed. This was a subsequent wound area we are dealing with. 3. She also has wounds in the right lateral thorax again these were in the middle of very large pannus areas. Initially complicated by probably widespread candidal/intertrigo. All of this is a lot better. 4. I think the patient would actually benefit from wearing an abdominal binder and we actually managed to get her 1 of these several months ago however she does not wear it Electronic Signature(s) Signed: 07/11/2019 12:52:55 PM By: Linton Ham MD Entered By: Linton Ham on 07/10/2019 08:09:49 -------------------------------------------------------------------------------- SuperBill Details Patient Name: Date of Service: Marcy Siren M. 07/09/2019 Medical Record Number: [id number] Patient Account Number: [id number] Date of Birth/Sex: Treating RN: 09-22-1955 (64 y.o. Orvan Falconer Primary Care Provider: SYSTEM,  PCP Other Clinician: Referring Provider: Treating Provider/Extender: Lily Peer in Treatment: 105 Diagnosis Coding ICD-10 Codes Code Description T81.31XD Disruption of external operation (surgical) wound, not elsewhere classified, subsequent encounter S31.104D Unspecified open wound of abdominal wall, left lower quadrant without penetration into peritoneal cavity, subsequent encounter L98.498 Non-pressure chronic ulcer of skin of other sites with other specified severity I89.0 Lymphedema, not elsewhere classified B35.4 Tinea corporis Facility Procedures The patient participates with Medicare or their insurance follows the Medicare Facility Guidelines: CPT4 Code Description Modifier Quantity [id number] [place] VISIT-LEV 4  EST PT 1 Physician Procedures : CPT4 Code Description Modifier [id number] - WC PHYS LEVEL 3 - EST PT ICD-10 Diagnosis Description T81.31XD Disruption of external operation (surgical) wound, not elsewhere classified, subsequent encounter S31.104D Unspecified open wound of abdominal  wall, left lower quadrant without penetration into peritoneal cavit subsequent encounter L98.498 Non-pressure chronic ulcer of skin of other sites with other specified severity I89.0 Lymphedema, not elsewhere classified Quantity: 1 y, Electronic Signature(s) Signed: 07/11/2019 12:52:55 PM By: Linton Ham MD Previous Signature: 07/09/2019 5:13:38 PM Version By: Carlene Coria RN Entered By: Linton Ham on 07/10/2019 08:10:13

## 2019-07-12 ENCOUNTER — Encounter (HOSPITAL_BASED_OUTPATIENT_CLINIC_OR_DEPARTMENT_OTHER): Payer: No Typology Code available for payment source | Admitting: Internal Medicine

## 2019-08-01 ENCOUNTER — Encounter (HOSPITAL_BASED_OUTPATIENT_CLINIC_OR_DEPARTMENT_OTHER): Payer: Medicare Other | Attending: Internal Medicine | Admitting: Internal Medicine

## 2019-08-01 ENCOUNTER — Other Ambulatory Visit: Payer: Self-pay

## 2019-08-01 DIAGNOSIS — M069 Rheumatoid arthritis, unspecified: Secondary | ICD-10-CM | POA: Diagnosis not present

## 2019-08-01 DIAGNOSIS — T8133XA Disruption of traumatic injury wound repair, initial encounter: Secondary | ICD-10-CM | POA: Insufficient documentation

## 2019-08-01 DIAGNOSIS — L98498 Non-pressure chronic ulcer of skin of other sites with other specified severity: Secondary | ICD-10-CM | POA: Diagnosis not present

## 2019-08-01 DIAGNOSIS — T8189XA Other complications of procedures, not elsewhere classified, initial encounter: Secondary | ICD-10-CM | POA: Insufficient documentation

## 2019-08-01 DIAGNOSIS — E65 Localized adiposity: Secondary | ICD-10-CM | POA: Diagnosis not present

## 2019-08-01 DIAGNOSIS — E1140 Type 2 diabetes mellitus with diabetic neuropathy, unspecified: Secondary | ICD-10-CM | POA: Diagnosis not present

## 2019-08-01 DIAGNOSIS — N186 End stage renal disease: Secondary | ICD-10-CM | POA: Diagnosis not present

## 2019-08-01 DIAGNOSIS — Y838 Other surgical procedures as the cause of abnormal reaction of the patient, or of later complication, without mention of misadventure at the time of the procedure: Secondary | ICD-10-CM | POA: Diagnosis not present

## 2019-08-01 DIAGNOSIS — B354 Tinea corporis: Secondary | ICD-10-CM | POA: Diagnosis not present

## 2019-08-01 DIAGNOSIS — E1122 Type 2 diabetes mellitus with diabetic chronic kidney disease: Secondary | ICD-10-CM | POA: Insufficient documentation

## 2019-08-01 DIAGNOSIS — Z6841 Body Mass Index (BMI) 40.0 and over, adult: Secondary | ICD-10-CM | POA: Diagnosis not present

## 2019-08-01 DIAGNOSIS — I890 Lymphedema, not elsewhere classified: Secondary | ICD-10-CM | POA: Diagnosis not present

## 2019-08-01 DIAGNOSIS — T8131XA Disruption of external operation (surgical) wound, not elsewhere classified, initial encounter: Secondary | ICD-10-CM | POA: Insufficient documentation

## 2019-08-01 DIAGNOSIS — I129 Hypertensive chronic kidney disease with stage 1 through stage 4 chronic kidney disease, or unspecified chronic kidney disease: Secondary | ICD-10-CM | POA: Insufficient documentation

## 2019-08-01 DIAGNOSIS — R600 Localized edema: Secondary | ICD-10-CM | POA: Diagnosis not present

## 2019-08-01 LAB — GLUCOSE, CAPILLARY: Glucose-Capillary: 145 mg/dL — ABNORMAL HIGH (ref 70–99)

## 2019-08-05 NOTE — Progress Notes (Signed)
RYN, PEINE ([id number]) Visit Report for 08/01/2019 HPI Details Patient Name: Date of Service: [name] [name] [facility] M. 08/01/2019 11:00 A M Medical Record Number: [id number] Patient Account Number: [id number] Date of Birth/Sex: Treating RN: Oct 14, 1955 (64 y.o. [name] [name] Primary Care Provider: SYSTEM, PCP Other Clinician: Referring Provider: Treating Provider/Extender: Lily Peer in Treatment: 109 History of Present Illness HPI Description: 06/29/17; this is a 64 year old woman who is a poor historian. It appears that she's had limited medical care over the last 2 years or so and what she had his through the [place]. She is here for review of a draining area on her right shoulder anteriorly as well as a nonhealing surgical wound in the left lower abdomen. Per her description she had a hysterectomy in 2006 4 "tumors". About 9 months later she had have another operation in the same area because of "tumors" involving her fallopian tubes and ovary. Sounds as though the surgical wound in this area that took a long time to heal. Apparently the area on her left lower abdomen is been open for the last 6 or 7 months. She is been applying topical antibiotics. 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 She had a fall in late 2018 and the open area on top of her shoulder. She is had draining pus coming out of this wound since she's been applying topical antibiotics and a Band-Aid. She is not complaining of a lot of pain or fever although she did have chills today. She saw her doctor at the [place] yesterday she doesn't know her name she apparently was referred to a surgeon at the [facility] and [place] presumably an orthopedic surgeon to look at that shoulder. She also has a primary doctor in [place] by the name of Dr. Jimmye Norman who referred her here after seeing her within the last month. 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 We don't have a lot of information in her in Epic. Last hospitalization was from 11/21/16 through 11/24/16 with hypoglycemia felt to be secondary to sulfonylureas. There is no mention of a wound at that time. 07/07/17; patient's shoulder cultured MSSA. I put her on clindamycin to had some anaerobic coverage with caution about diarrhea. I had asked her to make an orthopedic follow-up at the [place] with a doctor that made the original surgery but she has not done that yet. She also has a fairly large wound in the left lower abdomen in the middle of his surgical scar. This was apparently gynecologic surgery perhaps fibroid surgery. In any case these wounds are larger today. 07/21/17; the patient went to see the orthopedic doctor at the [facility] who did her original surgery surgery. Per the patient's description the draining area had closed down. He didn't think anything needed to be done there were no imaging studies. The lower abdominal wound in the left mid pelvis area also looks somewhat better to me. There is less surface material on this still some probing areas but not a lot of depth. We've been using silver alginate 08/03/17; the patient arrives back in 2 week follow-up. The draining sinus on her right shoulder has not reopened although she is complaining of widespread shoulder and other joint pain. She is using silver alginate to the remaining surgical wound on her abdomen. 08/18/17; the patient has 2 week follow-up appointment. The draining sinus on  her right shoulder is not reopened. She is complaining of left shoulder pain today. The presumed surgical wound on her lower abdomen looks quite a bit better and this is really contracted quite nicely. When she first came in the deep sinus she had on the anterior shoulder cultured MSSA. I gave her clindamycin. I arranged for orthopedic follow-up at the [facility] however this  is already closed nevertheless this was a deep probing tunnel and I think it needs to be watched for reopening. 09/07/17; surgical wound on her lower abdomen. This as 2 attached leaflets. The larger one is superiorly. A small probing area here with some sanguinous drainage. Surface needed debridement today. We've been using silver alginate however I changed this to silver collagen today 09/15/17. Surgical wound on her lower abdomen. No recent improvement although this is markedly improved from when we first started seeing her although the original problem was a wound on her right shoulder which is long since closed.switched her to Silver collagen last week She arrives in clinic today complaining of abdominal pain "so bad I almost went to the ER". She tells me she had an endoscopy and colonoscopy about a week ago. This was at the [place], I have no way to look at this. She is unaware of any biopsy was done 10/02/17 on evaluation today patient continues to do well in regard to her abdominal wound were using Prisma it's obviously taken some time for this to heal but nonetheless I do think that she is making good progress currently. There does not appear to be any evidence of infection at this time which is good news. She still does have some tunneling or undermining but again I think this is still doing well. 10/19/17; she continues to make good progress in this surgical abdominal wound using Prisma. This is come in nicely at one point had considerable depth. No evidence of infection. The patient is not complaining of pain in this area but is complaining of diabetic neuropathy pain in her right foot 11/02/17;the patient's wound is superficial and is really continued to make decent progress. I thought this would be more problematic when I first saw however this appears to a filled in nicely there is no depth. It exists in the crevice between what I think is lower abdominal hernias. She comes in complaining  of generalized pain however she is running out of her narcotics providedto her by a local pain clinic. She is asking for home health however she is [place] as I understand we cannot order this. 11/16/2017; 2-week follow-up. She has a lower abdominal surgical wound and this appears to be making nice progress closing in nicely each time we see it. This was initially a deep crevice surgical area between what I think is to lower abdominal hernias. In spite of this wound has healthy granulation. There does not appear to be any depth at all. There is probably a lot of tensile stress on the wound area however in spite of this things actually look better 11/30/2017; 2-week follow-up. She is complaining of left lower quadrant abdominal pain it is difficult to characterize this clearly episodic and not really associated with nausea or vomiting. She follows at the [place]. T me she was recently seen there by her new primary physician. The wound is in the lower ells abdomen in the middle of what I think was a gynecologic surgical site. We have made progress with this. 12/14/2017; 2-week follow-up. The patient comes in today with an improved wound  area on the lower mid abdomen surgical scar which was the wound we have been following her for. Paradoxically she has developed a new open area just to the right of the midline almost a mirror imaging wound on the right. She states this came up about a week ago. She does not have a clear explanation. Both of these wounds are in surgical scar areas although from her I think this surgery was remote. She denies scratching this or traumatizing it. It is surrounded by 2 large areas that I suspect are herniations but I still cannot see how this would have come from trauma. 12/28/2017; the area on the left surgical scar in the lower abdomen is better however the area on the right that we identified last time is worse. Increase in dimensions. There is no obvious infection here. We have  been using silver alginate. She tells me she has been to the ER at the [place] and they gave her doxycycline she also saw her primary doctor yesterday. I think she needs to be referred to dermatology consideration of a skin biopsy question panniculitis 01/11/2018; we continue to have improvement in the left lower mid abdominal wound however the area on the right that we identified in late October has gotten quite a bit worse. There is not appear to be surrounding infection although the patient is complaining of a lot of pain. We have been using silver alginate. [facility] I think this should help 01/24/18 on evaluation today patient actually apparently has two abdominal wounds the one on the right is new here and unfortunately seems to be doing a little bit worse and that it's a little bit larger. Fortunately there does not appear to be any sign of infection at this time. No fevers, chills, nausea, or vomiting noted at this time. Overall again it sounds like this is a fairly complex issue and despite the fact that we been using silver nitrate in the [facility] Dressing still has some hyper granulation. No fevers, chills, nausea, or vomiting noted at this time. 01/31/18 plan evaluation today patient appears to be doing better in general in regard to her wounds. She has been tolerating the dressing changes without complication. I do feel like that right now she's making some progress so she still is having some discomfort. 02/07/18 on evaluation today patient actually appears to be doing rather well in regard to her double ulcers as far as I'm concerned. With that being said she is somewhat frustrated in general in regard to the overall appearance of everything. I feel like this is unwarranted however since she seems to be making progress. 02/15/2018; lower mid abdomen right much greater than left although the left was the original wound. The area on  the left looks really quite good and is smaller. The area on the right seems longer and measures worse in terms of dimensions. These areas are in the most dependent part of her large abdominal pannus and this may be the entire issue. at [facility] on the left there is lymphedema around the wound. The patient is seeing dermatology next week apparently in [place] 03/01/2017; she likely had abdomen. Right now he other than left ankle both he has better today. I think both of these areas are smaller. Surface looks healthier she is due to see dermatology later this month. Using [facility] 1/23; abdominal wounds in the lower mid abdomen. This is in a very dependent area  in her lower abdominal pannus and the simple tension in this area may have a lot to do with this. She also has constant chronic pruritus and scratching and I wonder if this has something to do with open wounds in this area. She tells me her daughter is helping her with the dressings currently and the original wound on the left side of her mid abdomen is better almost closed the larger area on the right also looks some better. She sees a dermatologist next week I have told her to ask the dermatologist for help with generalized pruritus. The probable skin wounds that she has all over secondary to scratching. The wounds we have been treating may be related to simple tension in a dependent lymphedematous area. She could benefit from an abdominal binder to help support this area 2/13; abdominal wounds in the lower mid abdomen. Very dependent in her abdomen and this may be secondary to tension in the lower part of her abdominal pannus plus or minus edema. 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 She claims not to be scratching at the wound areas. 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 They give her gentian violet to put on the wound area but this is already present and Hydrofera Blue. The original wound on the left is healed today. However the area on the right part of her mid abdominal pannus appears larger. We are going to try to work with her on getting an abdominal binder to help support the weight of the pannus around the wounds 3/9; abdominal wound in the lower mid abdomen. She apparently has an abdominal binder that she bought on [employer]. She is using Hydrofera Blue to the wound on the right of the midline. The original wound was on the left and is closed over. 3/26; abdominal wound in the lower mid abdomen. This is her second wound in this area. The lower part of her abdominal pannus surrounding surgical scar. Once again she is not wearing the abdominal binder she said she obtained. The dressing is Hydrofera Blue 4/16; abdomen in the right lower mid abdomen. This is the second wound in this area the other was a mirror image area on the left. I had her seen by dermatology at the [facility] this was not very helpful. She is not wearing her abdominal binder. We have been using Hydrofera Blue which is what healed at the other side. The abdominal wound is actually larger She arrives in clinic today complaining of the plantar right heel. Very tender. She has been pushing herself up in her bed with her heels and this may be a deep tissue injury. 4/23; abdomen wound is about the same. Spreading along the folds of her pannus somewhat but generally the surface looks somewhat better. She is not using her abdominal binder although she did bring it in this time She arrives in clinic complaining of pain and itching in the right lateral rib area. She has 3 open areas here which are circular wounds. These are new She also mentions her lower sacrum and there are 2 wounds in this area. These are new 5/7; abdominal wound is about  the same although in general it looks better than a few weeks ago at which time it had punched out depth. No debridement is  required. She will not wear her abdominal binder She has areas on her right lateral rib cage 3 open areas circular wounds these look about the same as last time. I am assuming these have something to do with the prurigo nodularis that she may have scratched but I am not certain. Finally she has a linear area on her lower sacrum which I am assuming is a pressure area 5/21-Patient returns at 2 weeks for abdominal wound, areas on the right lateral rib cage, and lower sacrum linear area which is a pressure sore. We have been using silver alginate to all these wounds. She has been asked to wear abdominal binder which she is not. she has 2 new wounds on right flank area and on her coccyx. she has a total of 6 wounds 6/9; I have been following this patient for a prolonged period of time for wounds on her lower mid abdomen which I think were secondary to severe lymphedema. She had an area on the left of the midline that eventually healed only to open on the right side. This is still open we have been using silver alginate. She will not use an abdominal binder. About a month ago she arrived here with open areas on the right lateral ribs. This is in the middle of a large amount of soft tissue/pannus fold. She has 4 open areas here at this time. Finally she has 2 open areas in her lower coccyx. We have been using silver alginate on all the wound areas 6/25; we are following this woman for wounds on her lower mid abdomen in the setting of a pannus and severe dependent lymphedema. More recently she has had areas in the folds on the right lateral rib area. Culture I did of this area last time was negative. Finally she has an area on the coccyx. We have been using silver alginate to all the wound areas 7/9; this is a patient with morbid obesity severe lymphedema in her lower abdominal  pannus/severe dependent abdominal lymphedema. She first came to this clinic with an area on the left lower central abdomen. This eventually healed over. About the same time as it did heal over she developed an area in close juxtaposition on the other side of the midline. We have been following this for some months when she was here last time I thought things were improving although she a lot arrives today with a larger wound. She will not use her external abdominal binder. Over the last several weeks she has had areas on her right lateral rib area. The exact etiology of this is unclear we have been using silver alginate. She arrives today with the entire area inflamed angry with loss of surface epithelium and marked pruritus. I think this is tinea/Candida. This could be secondary or the primary in the etiology of these expanding areas. 7/23; the area on the right lateral rib areas is quite a bit better. Skin that we treated with oral and topical antifungals looks a lot better around this area. Unfortunately the lower abdominal area actually measures larger. There is no drainage here really no surrounding erythema. 8/6-Patient arrives to the clinic at 3 weeks, the right lateral rib cage area has copious drainage but the wounds look a little better, the abdominal midline wound unfortunately is larger, she is not able to use the binder both wounds have a clean base 8/20. Since the patient was last here she was hospitalized from 813 through 818. This was predominantly because of  hyperkalemia secondary to lisinopril and dehydration. Lisinopril was discontinued at discharge. She was also felt to have mild cellulitis around several skin ulcers she was given antibiotics in the hospital but not discharged on antibiotics. Also noted that she has stage IV chronic kidney disease with a baseline creatinine at 1.7. 9/3 the patient still has a 2 wound areas lower mid abdominal pannus and the right lateral ribs. We  have been using polymen to the abdominal wound and silver alginate to the area on the folds on her right lateral ribs. She is complaining about pruritus around the areas on the ribs. 9/17; 2-week follow-up. The area on the mid lower abdomen better using PolyMem. She has 4 small areas in the pannus fold on the right lateral ribs and is a new area on the left lateral lower abdominal folds this week. 10/1; 2-week follow-up. She has been using PolyMem on the mid lower abdomen and really not much improvement. She has this for small areas in the pannus fold of the right lateral ribs. These are gradually I think improving. The area that was new last time on the left lower abdominal pannus has closed over. 10/15; 2-week follow-up. I changed to silver collagen on the mid lower abdomen wound which seems smaller today. She is still using silver alginate on the right lateral ribs and a pannus fold and this looks better today although she is complaining of itching in this area She has had an outbreak on her face which almost looks like cystic acne. She is going to see her primary doctor about this tomorrow 11/5; silver collagen on the mid lower abdomen which seems to be doing a good job we are using silver alginate in the pannus folds on her right lateral ribs. She is complaining of pruritus in this area which is probably intertrigo/Candida. I will prescribe nystatin powder 12/3. We are using silver collagen to her mid lower abdomen which looks better. The pannus area on the right lateral ribs we are using silver alginate she is pointing nystatin powder in here. The wounds look better. She is separating the folds with hand towels. We have suggested Interdry and she is going to look into getting that online 12/28; 1 month follow-up. In general the area on her mid lower abdomen looks a lot better. Surface area is smaller we have been using silver collagen. The pannus area on the right lateral ribs we have been using  alginate. She also is putting Interdry in these areas. 1/25; 1 month follow-up. The area on her abdomen is about the same. We have been using silver collagen here. The areas on her right thorax and the pannus folds in this area are down to 3 small wounds we have been using silver alginate here 3/1; the area on her pannus of the lower abdomen on the right of midline is about the same. We have been using silver collagen for a protracted period and previously we have used Hydrofera Blue. The area is in a pannus fold in her right lateral thorax actually are smaller. There are 3 wounds in this area. She is using calcium alginate here. She is complaining of increasing ankle edema she saw her primary doctor at the [place] in [place] and had her Lasix increased otherwise she has no additional new complaint 4/8; the area on the pannus of her lower abdomen looks somewhat better. We have been using polymen. The area in the pannus in her right lateral thorax are down to 2 small  open areas. We have been using calcium alginate 5/18; monthly follow-up visit. The wound on the lower abdomen actually looks some better. We changed her to polymen last time. We have been using alginate in the areas in the pannus folds on her right lateral thorax 1 of these is healed everything else looks a lot better. 6/10; 3-week follow-up. The patient has a new wound in the left lateral flank area that is been there about a week. Home health called about this. In the meantime her original wound on the lower right abdominal midline is really healthy looking. In the area in the folds of her right lateral thorax are just about closed. The new area is on the left lateral abdomen/flank area. Electronic Signature(s) Signed: 08/05/2019 8:32:12 AM By: Linton Ham MD Entered By: Linton Ham on 08/01/2019 12:59:54 -------------------------------------------------------------------------------- Physical Exam Details Patient Name: Date  of Service: [name] Siren M. 08/01/2019 11:00 A M Medical Record Number: [id number] Patient Account Number: [id number] Date of Birth/Sex: Treating RN: 1956/01/18 (64 y.o. [name] [name] Primary Care Provider: SYSTEM, PCP Other Clinician: Referring Provider: Treating Provider/Extender: Lily Peer in Treatment: 109 Constitutional Patient is hypertensive.. Pulse regular and within target range for patient.Marland Kitchen Respirations regular, non-labored and within target range.. Temperature is normal and within the target range for the patient.Marland Kitchen Appears in no distress. Notes Wound exam; midline lower abdominal wound on the right looks quite healthy and smaller New wound on the left lateral flank in the middle of pannus folds. This has about the size of a quarter. No major problems with the wound bed. Only a small open wound remains from the right lateral rib area. Again these were in folds initially complicated by a probable candidal or tinea infection this is just about closed. Electronic Signature(s) Signed: 08/05/2019 8:32:12 AM By: Linton Ham MD Entered By: Linton Ham on 08/01/2019 13:01:52 -------------------------------------------------------------------------------- Physician Orders Details Patient Name: Date of Service: [name] Siren M. 08/01/2019 11:00 A M Medical Record Number: [id number] Patient Account Number: [id number] Date of Birth/Sex: Treating RN: [birth year] (64 y.o. [name] [name] Primary Care Provider: SYSTEM, PCP Other Clinician: Referring Provider: Treating Provider/Extender: Lily Peer in Treatment: [phone] Verbal / Phone Orders: No Diagnosis Coding ICD-10 Coding Code Description T81.31XD Disruption of external operation (surgical) wound, not elsewhere classified, subsequent encounter S31.104D Unspecified open wound of abdominal wall, left lower quadrant without penetration into peritoneal cavity,  subsequent encounter L98.498 Non-pressure chronic ulcer of skin of other sites with other specified severity I89.0 Lymphedema, not elsewhere classified B35.4 Tinea corporis Follow-up Appointments Return appointment in 1 month. Dressing Change Frequency Other: - all wounds - change dressing twice a week. Skin Barriers/Peri-Wound Care Skin Prep Wound Cleansing May shower and wash wound with soap and water. Primary Wound Dressing Wound #3 Right Abdomen - Lower Quadrant Polymem Silver Wound #7 Right,Proximal Flank Calcium Alginate Wound #8 Left Flank Calcium Alginate Secondary Dressing Dry Gauze ABD pad - secure with tape Off-Loading Turn and reposition every 2 hours Other: - abdominal binder to support abdomen. Patient to float right heel while in chair or bed with pillow. Patient to keep pressure off right heel as much as possible. Roll up a face cloth or a pillowcase to separate all skin folds- under breast, abdomen, both flank areas. Additional Orders / Instructions Other: - ***[facility] with [facility] INTERDRY 10"x 12 feet roll or patient to purchase offline from [employer].*** [facility] for wound care. - [facility] Electronic Signature(s) Signed: 08/01/2019 5:25:48 PM By: Deon Pilling Signed: 08/05/2019 8:32:12 AM By: Linton Ham MD Entered By: Deon Pilling on 08/01/2019 11:57:03 -------------------------------------------------------------------------------- Problem List Details Patient Name: Date of Service: [name] Siren M. 08/01/2019 11:00 A M Medical Record Number: [id number] Patient Account Number: [id number] Date of Birth/Sex: Treating RN: 31-May-1955 (64 y.o. [name] [name] Primary Care Provider: SYSTEM, PCP Other Clinician: Referring Provider: Treating Provider/Extender: Lily Peer in Treatment: 109 Active Problems ICD-10 Encounter Code Description Active Date  MDM Diagnosis T81.31XD Disruption of external operation (surgical) wound, not elsewhere classified, 06/29/2017 No Yes subsequent encounter S31.104D Unspecified open wound of abdominal wall, left lower quadrant without 06/29/2017 No Yes penetration into peritoneal cavity, subsequent encounter L98.498 Non-pressure chronic ulcer of skin of other sites with other specified severity 06/14/2018 No Yes I89.0 Lymphedema, not elsewhere classified 08/30/2018 No Yes B35.4 Tinea corporis 08/30/2018 No Yes Inactive Problems ICD-10 Code Description Active Date Inactive Date M71.011 Abscess of bursa, right shoulder 06/29/2017 06/29/2017 L89.616 Pressure-induced deep tissue damage of right heel 06/07/2018 06/07/2018 L89.150 Pressure ulcer of sacral region, unstageable 06/14/2018 06/14/2018 Resolved Problems Electronic Signature(s) Signed: 08/05/2019 8:32:12 AM By: Linton Ham MD Entered By: Linton Ham on 08/01/2019 12:58:09 -------------------------------------------------------------------------------- Progress Note Details Patient Name: Date of Service: [name] Siren M. 08/01/2019 11:00 A M Medical Record Number: [id number] Patient Account Number: [id number] Date of Birth/Sex: Treating RN: [birth year] (64 y.o. [name] [name] Primary Care Provider: SYSTEM, PCP Other Clinician: Referring Provider: Treating Provider/Extender: Lily Peer in Treatment: 109 Subjective History of Present Illness (HPI) 06/29/17; this is a 64 year old woman who is a poor historian. It appears that she's had limited medical care over the last 2 years or so and what she had his through the [place]. She is here for review of a draining area on her right shoulder anteriorly as well as a nonhealing surgical wound in the left lower abdomen. Per her description she had a hysterectomy in 2006 4 "tumors". About 9 months later she had have another operation in the same area because of "tumors" involving her  fallopian tubes and ovary. Sounds as though the surgical wound in this area that took a long time to heal. Apparently the area on her left lower abdomen is been open for the last 6 or 7 months. She is been applying topical antibiotics. 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 She had a fall in late 2018 and the open area on top of her shoulder. She is had draining pus coming out of this wound since she's been applying topical antibiotics and a Band-Aid. She is not complaining of a lot of pain or fever although she did have chills today. She saw her doctor at the [place] yesterday she doesn't know her name she apparently was referred to a surgeon at the [facility] and [place] presumably an orthopedic surgeon to look at that shoulder. She also has a primary doctor in [place] by the name of Dr. Jimmye Norman who referred her here after seeing her within the last month. 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 We don't have a lot of information in her in Epic. Last hospitalization was from 11/21/16 through 11/24/16 with hypoglycemia felt to be secondary to sulfonylureas. There is no mention of a wound at that time. 07/07/17;  patient's shoulder cultured MSSA. I put her on clindamycin to had some anaerobic coverage with caution about diarrhea. I had asked her to make an orthopedic follow-up at the [place] with a doctor that made the original surgery but she has not done that yet. ooShe also has a fairly large wound in the left lower abdomen in the middle of his surgical scar. This was apparently gynecologic surgery perhaps fibroid surgery. In any case these wounds are larger today. 07/21/17; the patient went to see the orthopedic doctor at the [facility] who did her original surgery surgery. Per the patient's description the draining area had closed down. He didn't think anything needed  to be done there were no imaging studies. ooThe lower abdominal wound in the left mid pelvis area also looks somewhat better to me. There is less surface material on this still some probing areas but not a lot of depth. We've been using silver alginate 08/03/17; the patient arrives back in 2 week follow-up. The draining sinus on her right shoulder has not reopened although she is complaining of widespread shoulder and other joint pain. She is using silver alginate to the remaining surgical wound on her abdomen. 08/18/17; the patient has 2 week follow-up appointment. The draining sinus on her right shoulder is not reopened. She is complaining of left shoulder pain today. The presumed surgical wound on her lower abdomen looks quite a bit better and this is really contracted quite nicely. When she first came in the deep sinus she had on the anterior shoulder cultured MSSA. I gave her clindamycin. I arranged for orthopedic follow-up at the [facility] however this is already closed nevertheless this was a deep probing tunnel and I think it needs to be watched for reopening. 09/07/17; surgical wound on her lower abdomen. This as 2 attached leaflets. The larger one is superiorly. A small probing area here with some sanguinous drainage. Surface needed debridement today. We've been using silver alginate however I changed this to silver collagen today 09/15/17. Surgical wound on her lower abdomen. No recent improvement although this is markedly improved from when we first started seeing her although the original problem was a wound on her right shoulder which is long since closed.switched her to Silver collagen last week She arrives in clinic today complaining of abdominal pain "so bad I almost went to the ER". She tells me she had an endoscopy and colonoscopy about a week ago. This was at the [place], I have no way to look at this. She is unaware of any biopsy was done 10/02/17 on evaluation today patient continues to do well  in regard to her abdominal wound were using Prisma it's obviously taken some time for this to heal but nonetheless I do think that she is making good progress currently. There does not appear to be any evidence of infection at this time which is good news. She still does have some tunneling or undermining but again I think this is still doing well. 10/19/17; she continues to make good progress in this surgical abdominal wound using Prisma. This is come in nicely at one point had considerable depth. No evidence of infection. The patient is not complaining of pain in this area but is complaining of diabetic neuropathy pain in her right foot 11/02/17;the patient's wound is superficial and is really continued to make decent progress. I thought this would be more problematic when I first saw however this appears to a filled in nicely there is no depth. It exists in  the crevice between what I think is lower abdominal hernias. She comes in complaining of generalized pain however she is running out of her narcotics providedto her by a local pain clinic. She is asking for home health however she is [place] as I understand we cannot order this. 11/16/2017; 2-week follow-up. She has a lower abdominal surgical wound and this appears to be making nice progress closing in nicely each time we see it. This was initially a deep crevice surgical area between what I think is to lower abdominal hernias. In spite of this wound has healthy granulation. There does not appear to be any depth at all. There is probably a lot of tensile stress on the wound area however in spite of this things actually look better 11/30/2017; 2-week follow-up. She is complaining of left lower quadrant abdominal pain it is difficult to characterize this clearly episodic and not really associated with nausea or vomiting. She follows at the [place]. T me she was recently seen there by her new primary physician. The wound is in the lower ells abdomen in the  middle of what I think was a gynecologic surgical site. We have made progress with this. 12/14/2017; 2-week follow-up. The patient comes in today with an improved wound area on the lower mid abdomen surgical scar which was the wound we have been following her for. Paradoxically she has developed a new open area just to the right of the midline almost a mirror imaging wound on the right. She states this came up about a week ago. She does not have a clear explanation. Both of these wounds are in surgical scar areas although from her I think this surgery was remote. She denies scratching this or traumatizing it. It is surrounded by 2 large areas that I suspect are herniations but I still cannot see how this would have come from trauma. 12/28/2017; the area on the left surgical scar in the lower abdomen is better however the area on the right that we identified last time is worse. Increase in dimensions. There is no obvious infection here. We have been using silver alginate. She tells me she has been to the ER at the [place] and they gave her doxycycline she also saw her primary doctor yesterday. I think she needs to be referred to dermatology consideration of a skin biopsy question panniculitis 01/11/2018; we continue to have improvement in the left lower mid abdominal wound however the area on the right that we identified in late October has gotten quite a bit worse. There is not appear to be surrounding infection although the patient is complaining of a lot of pain. We have been using silver alginate. [facility] I think this should help 01/24/18 on evaluation today patient actually apparently has two abdominal wounds the one on the right is new here and unfortunately seems to be doing a little bit worse and that it's a little bit larger. Fortunately there does not appear to be any sign of infection at this time. No fevers, chills, nausea, or vomiting  noted at this time. Overall again it sounds like this is a fairly complex issue and despite the fact that we been using silver nitrate in the [facility] still has some hyper granulation. No fevers, chills, nausea, or vomiting noted at this time. 01/31/18 plan evaluation today patient appears to be doing better in general in regard to her wounds. She has been tolerating the dressing changes  without complication. I do feel like that right now she's making some progress so she still is having some discomfort. 02/07/18 on evaluation today patient actually appears to be doing rather well in regard to her double ulcers as far as I'm concerned. With that being said she is somewhat frustrated in general in regard to the overall appearance of everything. I feel like this is unwarranted however since she seems to be making progress. 02/15/2018; lower mid abdomen right much greater than left although the left was the original wound. The area on the left looks really quite good and is smaller. The area on the right seems longer and measures worse in terms of dimensions. These areas are in the most dependent part of her large abdominal pannus and this may be the entire issue. at [facility] on the left there is lymphedema around the wound. The patient is seeing dermatology next week apparently [place] [address] 03/01/2017; she likely had abdomen. Right now he other than left ankle both he has better today. I think both of these areas are smaller. Surface looks healthier she is due to see dermatology later this month. Using [facility] 1/23; abdominal wounds in the lower mid abdomen. This is in a very dependent area in her lower abdominal pannus and the simple tension in this area may have a lot to do with this. She also has constant chronic pruritus and scratching and I wonder if this has something to do with open wounds in this area. She tells me her daughter is helping her with the dressings currently  and the original wound on the left side of her mid abdomen is better almost closed the larger area on the right also looks some better. She sees a dermatologist next week I have told her to ask the dermatologist for help with generalized pruritus. The probable skin wounds that she has all over secondary to scratching. The wounds we have been treating may be related to simple tension in a dependent lymphedematous area. She could benefit from an abdominal binder to help support this area 2/13; abdominal wounds in the lower mid abdomen. Very dependent in her abdomen and this may be secondary to tension in the lower part of her abdominal pannus plus or minus edema. 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 She claims not to be scratching at the wound areas. 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 They give her gentian violet to put on the wound area but this is already present and Hydrofera Blue. The original wound on the left is healed today. However the area on the right part of her mid abdominal pannus appears larger. We are going to try to work with her on getting an abdominal binder to help support the weight of the pannus around the wounds 3/9; abdominal wound in the lower mid abdomen. She apparently has an abdominal binder that she bought on [employer]. She is using Hydrofera Blue to the wound on the right of the midline. The original wound was on the left and is closed over. 3/26; abdominal wound in the lower mid abdomen. This is her second wound in this area. The lower part of her abdominal pannus surrounding surgical scar. Once again she is not wearing the abdominal binder she said she obtained. The dressing is  Hydrofera Blue 4/16; abdomen in the right lower mid abdomen. This is  the second wound in this area the other was a mirror image area on the left. I had her seen by dermatology at the [facility] this was not very helpful. She is not wearing her abdominal binder. We have been using Hydrofera Blue which is what healed at the other side. The abdominal wound is actually larger She arrives in clinic today complaining of the plantar right heel. Very tender. She has been pushing herself up in her bed with her heels and this may be a deep tissue injury. 4/23; abdomen wound is about the same. Spreading along the folds of her pannus somewhat but generally the surface looks somewhat better. She is not using her abdominal binder although she did bring it in this time ooShe arrives in clinic complaining of pain and itching in the right lateral rib area. She has 3 open areas here which are circular wounds. These are new ooShe also mentions her lower sacrum and there are 2 wounds in this area. These are new 5/7; abdominal wound is about the same although in general it looks better than a few weeks ago at which time it had punched out depth. No debridement is required. She will not wear her abdominal binder ooShe has areas on her right lateral rib cage 3 open areas circular wounds these look about the same as last time. I am assuming these have something to do with the prurigo nodularis that she may have scratched but I am not certain. Finally she has a linear area on her lower sacrum which I am assuming is a pressure area 5/21-Patient returns at 2 weeks for abdominal wound, areas on the right lateral rib cage, and lower sacrum linear area which is a pressure sore. We have been using silver alginate to all these wounds. She has been asked to wear abdominal binder which she is not. she has 2 new wounds on right flank area and on her coccyx. she has a total of 6 wounds 6/9; I have been following this patient for a prolonged period of time for wounds on her lower mid abdomen which I think  were secondary to severe lymphedema. She had an area on the left of the midline that eventually healed only to open on the right side. This is still open we have been using silver alginate. She will not use an abdominal binder. About a month ago she arrived here with open areas on the right lateral ribs. This is in the middle of a large amount of soft tissue/pannus fold. She has 4 open areas here at this time. Finally she has 2 open areas in her lower coccyx. We have been using silver alginate on all the wound areas 6/25; we are following this woman for wounds on her lower mid abdomen in the setting of a pannus and severe dependent lymphedema. More recently she has had areas in the folds on the right lateral rib area. Culture I did of this area last time was negative. Finally she has an area on the coccyx. We have been using silver alginate to all the wound areas 7/9; this is a patient with morbid obesity severe lymphedema in her lower abdominal pannus/severe dependent abdominal lymphedema. She first came to this clinic with an area on the left lower central abdomen. This eventually healed over. About the same time as it did heal over she developed an area in close juxtaposition on the other side of  the midline. We have been following this for some months when she was here last time I thought things were improving although she a lot arrives today with a larger wound. She will not use her external abdominal binder. Over the last several weeks she has had areas on her right lateral rib area. The exact etiology of this is unclear we have been using silver alginate. She arrives today with the entire area inflamed angry with loss of surface epithelium and marked pruritus. I think this is tinea/Candida. This could be secondary or the primary in the etiology of these expanding areas. 7/23; the area on the right lateral rib areas is quite a bit better. Skin that we treated with oral and topical antifungals  looks a lot better around this area. Unfortunately the lower abdominal area actually measures larger. There is no drainage here really no surrounding erythema. 8/6-Patient arrives to the clinic at 3 weeks, the right lateral rib cage area has copious drainage but the wounds look a little better, the abdominal midline wound unfortunately is larger, she is not able to use the binder both wounds have a clean base 8/20. Since the patient was last here she was hospitalized from 813 through 818. This was predominantly because of hyperkalemia secondary to lisinopril and dehydration. Lisinopril was discontinued at discharge. She was also felt to have mild cellulitis around several skin ulcers she was given antibiotics in the hospital but not discharged on antibiotics. Also noted that she has stage IV chronic kidney disease with a baseline creatinine at 1.7. 9/3 the patient still has a 2 wound areas lower mid abdominal pannus and the right lateral ribs. We have been using polymen to the abdominal wound and silver alginate to the area on the folds on her right lateral ribs. She is complaining about pruritus around the areas on the ribs. 9/17; 2-week follow-up. The area on the mid lower abdomen better using PolyMem. She has 4 small areas in the pannus fold on the right lateral ribs and is a new area on the left lateral lower abdominal folds this week. 10/1; 2-week follow-up. She has been using PolyMem on the mid lower abdomen and really not much improvement. She has this for small areas in the pannus fold of the right lateral ribs. These are gradually I think improving. The area that was new last time on the left lower abdominal pannus has closed over. 10/15; 2-week follow-up. I changed to silver collagen on the mid lower abdomen wound which seems smaller today. She is still using silver alginate on the right lateral ribs and a pannus fold and this looks better today although she is complaining of itching in this  area She has had an outbreak on her face which almost looks like cystic acne. She is going to see her primary doctor about this tomorrow 11/5; silver collagen on the mid lower abdomen which seems to be doing a good job we are using silver alginate in the pannus folds on her right lateral ribs. She is complaining of pruritus in this area which is probably intertrigo/Candida. I will prescribe nystatin powder 12/3. We are using silver collagen to her mid lower abdomen which looks better. The pannus area on the right lateral ribs we are using silver alginate she is pointing nystatin powder in here. The wounds look better. She is separating the folds with hand towels. We have suggested Interdry and she is going to look into getting that online 12/28; 1 month follow-up. In general  the area on her mid lower abdomen looks a lot better. Surface area is smaller we have been using silver collagen. The pannus area on the right lateral ribs we have been using alginate. She also is putting Interdry in these areas. 1/25; 1 month follow-up. The area on her abdomen is about the same. We have been using silver collagen here. The areas on her right thorax and the pannus folds in this area are down to 3 small wounds we have been using silver alginate here 3/1; the area on her pannus of the lower abdomen on the right of midline is about the same. We have been using silver collagen for a protracted period and previously we have used Hydrofera Blue. ooThe area is in a pannus fold in her right lateral thorax actually are smaller. There are 3 wounds in this area. She is using calcium alginate here. She is complaining of increasing ankle edema she saw her primary doctor at the [place] in [place] and had her Lasix increased otherwise she has no additional new complaint 4/8; the area on the pannus of her lower abdomen looks somewhat better. We have been using polymen. The area in the pannus in her right lateral thorax  are down to 2 small open areas. We have been using calcium alginate 5/18; monthly follow-up visit. The wound on the lower abdomen actually looks some better. We changed her to polymen last time. We have been using alginate in the areas in the pannus folds on her right lateral thorax 1 of these is healed everything else looks a lot better. 6/10; 3-week follow-up. The patient has a new wound in the left lateral flank area that is been there about a week. Home health called about this. In the meantime her original wound on the lower right abdominal midline is really healthy looking. In the area in the folds of her right lateral thorax are just about closed. The new area is on the left lateral abdomen/flank area. Objective Constitutional Patient is hypertensive.. Pulse regular and within target range for patient.Marland Kitchen Respirations regular, non-labored and within target range.. Temperature is normal and within the target range for the patient.Marland Kitchen Appears in no distress. Vitals Time Taken: 11:29 AM, Height: 62 in, Weight: 260 lbs, BMI: 47.5, Temperature: 97.6 F, Pulse: 76 bpm, Respiratory Rate: 18 breaths/min, Blood Pressure: 144/78 mmHg. General Notes: Wound exam; midline lower abdominal wound on the right looks quite healthy and smaller ooNew wound on the left lateral flank in the middle of pannus folds. This has about the size of a quarter. No major problems with the wound bed. ooOnly a small open wound remains from the right lateral rib area. Again these were in folds initially complicated by a probable candidal or tinea infection this is just about closed. Integumentary (Hair, Skin) Wound #3 status is Open. Original cause of wound was Gradually Appeared. The wound is located on the Right Abdomen - Lower Quadrant. The wound measures 5cm length x 1.5cm width x 1.2cm depth; 5.89cm^2 area and 7.069cm^3 volume. There is Fat Layer (Subcutaneous Tissue) Exposed exposed. There is no tunneling or undermining  noted. There is a medium amount of serosanguineous drainage noted. The wound margin is distinct with the outline attached to the wound base. There is large (67-100%) pink granulation within the wound bed. There is no necrotic tissue within the wound bed. Wound #7 status is Open. Original cause of wound was Gradually Appeared. The wound is located on the Right,Proximal Flank. The wound measures 0.3cm  length x 1.2cm width x 0.1cm depth; 0.283cm^2 area and 0.028cm^3 volume. There is Fat Layer (Subcutaneous Tissue) Exposed exposed. There is no tunneling or undermining noted. There is a small amount of serosanguineous drainage noted. The wound margin is flat and intact. There is large (67-100%) red granulation within the wound bed. There is no necrotic tissue within the wound bed. Wound #8 status is Open. Original cause of wound was Gradually Appeared. The wound is located on the Left Flank. The wound measures 1.7cm length x 2.5cm width x 0.3cm depth; 3.338cm^2 area and 1.001cm^3 volume. There is Fat Layer (Subcutaneous Tissue) Exposed exposed. There is no tunneling or undermining noted. There is a medium amount of serosanguineous drainage noted. There is large (67-100%) red granulation within the wound bed. There is a small (1-33%) amount of necrotic tissue within the wound bed including Adherent Slough. Assessment Active Problems ICD-10 Disruption of external operation (surgical) wound, not elsewhere classified, subsequent encounter Unspecified open wound of abdominal wall, left lower quadrant without penetration into peritoneal cavity, subsequent encounter Non-pressure chronic ulcer of skin of other sites with other specified severity Lymphedema, not elsewhere classified Tinea corporis Plan Follow-up Appointments: Return appointment in 1 month. Dressing Change Frequency: Other: - all wounds - change dressing twice a week. Skin Barriers/Peri-Wound Care: Skin Prep Wound Cleansing: May shower  and wash wound with soap and water. Primary Wound Dressing: Wound #3 Right Abdomen - Lower Quadrant: Polymem Silver Wound #7 Right,Proximal Flank: Calcium Alginate Wound #8 Left Flank: Calcium Alginate Secondary Dressing: Dry Gauze ABD pad - secure with tape Off-Loading: Turn and reposition every 2 hours Other: - abdominal binder to support abdomen. Patient to float right heel while in chair or bed with pillow. Patient to keep pressure off right heel as much as possible. Roll up a face cloth or a pillowcase to separate all skin folds- under breast, abdomen, both flank areas. Additional Orders / Instructions: Other: - ***[facility]DRY 10"x 12 feet roll or patient to purchase offline from [employer].*** Home Health: [facility] for wound care. - [facility] 1. Calcium alginate to the areas on the right lateral ribs and left flank. 2. Continue with PolyMem Ag to the lower abdominal midline wound which was her original wound. This look quite a bit better today. 3. I saw no evidence of skin infection at any site either bacterial or fungal Electronic Signature(s) Signed: 08/05/2019 8:32:12 AM By: Linton Ham MD Entered By: Linton Ham on 08/01/2019 13:02:48 -------------------------------------------------------------------------------- SuperBill Details Patient Name: Date of Service: [name] Siren M. 08/01/2019 Medical Record Number: [id number] Patient Account Number: [id number] Date of Birth/Sex: Treating RN: March 17, 1955 (64 y.o. [name] [name] Primary Care Provider: SYSTEM, PCP Other Clinician: Referring Provider: Treating Provider/Extender: Lily Peer in Treatment: 109 Diagnosis Coding ICD-10 Codes Code Description T81.31XD Disruption of external operation (surgical) wound, not elsewhere classified, subsequent encounter S31.104D Unspecified open wound of abdominal wall, left lower  quadrant without penetration into peritoneal cavity, subsequent encounter L98.498 Non-pressure chronic ulcer of skin of other sites with other specified severity I89.0 Lymphedema, not elsewhere classified B35.4 Tinea corporis Facility Procedures The patient participates with Medicare or their insurance follows the Medicare Facility Guidelines: CPT4 Code Description Modifier Quantity [id number] [phone] - WOUND CARE VISIT-LEV 5 EST PT 1 Physician Procedures : CPT4 Code Description Modifier [id number] - WC PHYS LEVEL 3 - EST PT ICD-10 Diagnosis Description T81.31XD Disruption of external operation (surgical) wound, not  elsewhere classified, subsequent encounter S31.104D Unspecified open wound of abdominal  wall, left lower quadrant without penetration into peritoneal cavit subsequent encounter L98.498 Non-pressure chronic ulcer of skin of other sites with other specified severity I89.0 Lymphedema, not elsewhere classified Quantity: 1 y, Electronic Signature(s) Signed: 08/05/2019 8:32:12 AM By: Linton Ham MD Entered By: Linton Ham on 08/01/2019 13:03:08

## 2019-08-07 NOTE — Progress Notes (Signed)
[name] [name], [name] [name] ([id number]) Visit Report for 08/01/2019 Arrival Information Details Patient Name: Date of Service: [name] [name] [facility] M. 08/01/2019 11:00 A M Medical Record Number: [id number] Patient Account Number: [id number] Date of Birth/Sex: Treating RN: 1955/04/13 (64 y.o. Orvan Falconer Primary Care Glorine Hanratty: SYSTEM, PCP Other Clinician: Referring Keian Odriscoll: Treating Kalev Temme/Extender: Lily Peer in Treatment: 28 Visit Information History Since Last Visit All ordered tests and consults were completed: No Patient Arrived: Wheel Chair Added or deleted any medications: No Arrival Time: 11:28 Any new allergies or adverse reactions: No Accompanied By: self Had a fall or experienced change in No Transfer Assistance: None activities of daily living that may affect Patient Identification Verified: Yes risk of falls: Secondary Verification Process Completed: Yes Signs or symptoms of abuse/neglect since last visito No Patient Requires Transmission-Based Precautions: No Hospitalized since last visit: No Patient Has Alerts: No Implantable device outside of the clinic excluding No cellular tissue based products placed in the center since last visit: Has Dressing in Place as Prescribed: Yes Pain Present Now: No Electronic Signature(s) Signed: 08/07/2019 9:54:21 AM By: Carlene Coria RN Entered By: Carlene Coria on 08/01/2019 11:29:14 -------------------------------------------------------------------------------- Clinic Level of Care Assessment Details Patient Name: Date of Service: [name] [name]. 08/01/2019 11:00 A M Medical Record Number: [id number] Patient Account Number: [id number] Date of Birth/Sex: Treating RN: 04/04/1955 (64 y.o. Helene Shoe, Tammi Klippel Primary Care Markesha Hannig: SYSTEM, PCP Other Clinician: Referring Carvin Almas: Treating Cyniah Gossard/Extender: Lily Peer in Treatment: 109 Clinic Level of Care Assessment  Items TOOL 4 Quantity Score X- 1 0 Use when only an EandM is performed on FOLLOW-UP visit ASSESSMENTS - Nursing Assessment / Reassessment X- 1 10 Reassessment of Co-morbidities (includes updates in patient status) X- 1 5 Reassessment of Adherence to Treatment Plan ASSESSMENTS - Wound and Skin A ssessment / Reassessment []  - 0 Simple Wound Assessment / Reassessment - one wound X- 3 5 Complex Wound Assessment / Reassessment - multiple wounds X- 1 10 Dermatologic / Skin Assessment (not related to wound area) ASSESSMENTS - Focused Assessment []  - 0 Circumferential Edema Measurements - multi extremities X- 1 10 Nutritional Assessment / Counseling / Intervention []  - 0 Lower Extremity Assessment (monofilament, tuning fork, pulses) []  - 0 Peripheral Arterial Disease Assessment (using hand held doppler) ASSESSMENTS - Ostomy and/or Continence Assessment and Care []  - 0 Incontinence Assessment and Management []  - 0 Ostomy Care Assessment and Management (repouching, etc.) PROCESS - Coordination of Care []  - 0 Simple Patient / Family Education for ongoing care X- 1 20 Complex (extensive) Patient / Family Education for ongoing care X- 1 10 Staff obtains Programmer, systems, Records, T Results / Process Orders est X- 1 10 Staff telephones HHA, Nursing Homes / Clarify orders / etc []  - 0 Routine Transfer to another Facility (non-emergent condition) []  - 0 Routine Hospital Admission (non-emergent condition) []  - 0 New Admissions / Biomedical engineer / Ordering NPWT Apligraf, etc. , []  - 0 Emergency Hospital Admission (emergent condition) []  - 0 Simple Discharge Coordination X- 1 15 Complex (extensive) Discharge Coordination PROCESS - Special Needs []  - 0 Pediatric / Minor Patient Management []  - 0 Isolation Patient Management []  - 0 Hearing / Language / Visual special needs []  - 0 Assessment of Community assistance (transportation, D/C planning, etc.) []  - 0 Additional  assistance / Altered mentation []  - 0 Support Surface(s) Assessment (bed, cushion, seat, etc.) INTERVENTIONS - Wound Cleansing / Measurement []  - 0 Simple Wound Cleansing -  one wound X- 3 5 Complex Wound Cleansing - multiple wounds X- 1 5 Wound Imaging (photographs - any number of wounds) []  - 0 Wound Tracing (instead of photographs) []  - 0 Simple Wound Measurement - one wound X- 3 5 Complex Wound Measurement - multiple wounds INTERVENTIONS - Wound Dressings X - Small Wound Dressing one or multiple wounds 3 10 []  - 0 Medium Wound Dressing one or multiple wounds []  - 0 Large Wound Dressing one or multiple wounds []  - 0 Application of Medications - topical []  - 0 Application of Medications - injection INTERVENTIONS - Miscellaneous []  - 0 External ear exam []  - 0 Specimen Collection (cultures, biopsies, blood, body fluids, etc.) []  - 0 Specimen(s) / Culture(s) sent or taken to Lab for analysis []  - 0 Patient Transfer (multiple staff / Civil Service fast streamer / Similar devices) []  - 0 Simple Staple / Suture removal (25 or less) []  - 0 Complex Staple / Suture removal (26 or more) []  - 0 Hypo / Hyperglycemic Management (close monitor of Blood Glucose) []  - 0 Ankle / Brachial Index (ABI) - do not check if billed separately X- 1 5 Vital Signs Has the patient been seen at the hospital within the last three years: Yes Total Score: 175 Level Of Care: New/Established - Level 5 Electronic Signature(s) Signed: 08/01/2019 5:25:48 PM By: Deon Pilling Entered By: Deon Pilling on 08/01/2019 12:49:39 -------------------------------------------------------------------------------- Encounter Discharge Information Details Patient Name: Date of Service: [name] Siren M. 08/01/2019 11:00 A M Medical Record Number: [id number] Patient Account Number: [id number] Date of Birth/Sex: Treating RN: 02-16-1956 (64 y.o. Orvan Falconer Primary Care Kym Scannell: SYSTEM, PCP Other Clinician: Referring  Ohana Birdwell: Treating Violia Knopf/Extender: Lily Peer in Treatment: 109 Encounter Discharge Information Items Discharge Condition: Stable Ambulatory Status: Wheelchair Discharge Destination: Home Transportation: Private Auto Accompanied By: friend Schedule Follow-up Appointment: Yes Clinical Summary of Care: Patient Declined Electronic Signature(s) Signed: 08/07/2019 9:54:21 AM By: Carlene Coria RN Entered By: Carlene Coria on 08/01/2019 12:25:31 -------------------------------------------------------------------------------- Multi Wound Chart Details Patient Name: Date of Service: [name] Siren M. 08/01/2019 11:00 A M Medical Record Number: [id number] Patient Account Number: [id number] Date of Birth/Sex: Treating RN: [birth year] (64 y.o. Helene Shoe, Tammi Klippel Primary Care Akili Cuda: SYSTEM, PCP Other Clinician: Referring Rahcel Shutes: Treating Monee Dembeck/Extender: Lily Peer in Treatment: 109 Vital Signs Height(in): 62 Pulse(bpm): 76 Weight(lbs): 260 Blood Pressure(mmHg): 144/78 Body Mass Index(BMI): 48 Temperature(F): 97.6 Respiratory Rate(breaths/min): 18 Photos: [3:No Photos Right Abdomen - Lower Quadrant] [7:No Photos Right, Proximal Flank] [8:No Photos Left Flank] Wound Location: [3:Gradually Appeared] [7:Gradually Appeared] [8:Gradually Appeared] Wounding Event: [3:Dehisced Wound] [7:Inflammatory] [8:Lesion] Primary Etiology: [3:Cataracts, Lymphedema, Sleep] [7:Cataracts, Lymphedema, Sleep] [8:Cataracts, Lymphedema, Sleep] Comorbid History: [3:Apnea, Hypertension, Type II Diabetes, End Stage Renal Disease, Gout, Rheumatoid Arthritis, Neuropathy 12/07/2017] [7:Apnea, Hypertension, Type II Diabetes, End Stage Renal Disease, Gout, Rheumatoid Arthritis, Neuropathy 04/17/2019]  [8:Apnea, Hypertension, Type II Diabetes, End Stage Renal Disease, Gout, Rheumatoid Arthritis, Neuropathy 07/23/2019] Date Acquired: [3:85] [7:14] [8:0] Weeks  of Treatment: [3:Open] [7:Open] [8:Open] Wound Status: [3:5x1.5x1.2] [7:0.3x1.2x0.1] [8:1.7x2.5x0.3] Measurements L x W x D (cm) [3:5.89] [7:0.283] [8:3.338] A (cm) : rea [3:7.069] [7:0.028] [8:1.001] Volume (cm) : [3:9.10%] [7:-71.50%] [8:N/A] % Reduction in Area: [3:-21.20%] [7:-75.00%] [8:N/A] % Reduction in Volume: [3:Full Thickness Without Exposed] [7:Full Thickness Without Exposed] [8:Full Thickness Without Exposed] Classification: [3:Support Structures Medium] [7:Support Structures Small] [8:Support Structures Medium] Exudate Amount: [3:Serosanguineous] [7:Serosanguineous] [8:Serosanguineous] Exudate Type: [3:red, brown] [7:red, brown] [8:red, brown] Exudate Color: [3:Distinct, outline attached] [  7:Flat and Intact] [8:N/A] Wound Margin: [3:Large (67-100%)] [7:Large (67-100%)] [8:Large (67-100%)] Granulation Amount: [3:Pink] [7:Red] [8:Red] Granulation Quality: [3:None Present (0%)] [7:None Present (0%)] [8:Small (1-33%)] Necrotic Amount: [3:Fat Layer (Subcutaneous Tissue)] [7:Fat Layer (Subcutaneous Tissue)] [8:Fat Layer (Subcutaneous Tissue)] Exposed Structures: [3:Exposed: Yes Fascia: No Tendon: No Muscle: No Joint: No Bone: No Medium (34-66%)] [7:Exposed: Yes Fascia: No Tendon: No Muscle: No Joint: No Bone: No Large (67-100%)] [8:Exposed: Yes Fascia: No Tendon: No Muscle: No Joint: No Bone: No None] Treatment Notes Wound #3 (Right Abdomen - Lower Quadrant) 1. Cleanse With Wound Cleanser 3. Primary Dressing Applied Polymem Ag 4. Secondary Dressing Dry Gauze 5. Secured With Tape Wound #7 (Right, Proximal Flank) 1. Cleanse With Wound Cleanser 3. Primary Dressing Applied Calcium Alginate Ag 4. Secondary Dressing Dry Gauze 5. Secured With Tape Wound #8 (Left Flank) 1. Cleanse With Wound Cleanser 3. Primary Dressing Applied Calcium Alginate Ag 4. Secondary Dressing Dry Gauze 5. Secured With Recruitment consultant) Signed: 08/01/2019 5:25:48 PM By: Deon Pilling Signed: 08/05/2019 8:32:12 AM By: Linton Ham MD Entered By: Linton Ham on 08/01/2019 12:58:16 -------------------------------------------------------------------------------- Multi-Disciplinary Care Plan Details Patient Name: Date of Service: [name] Siren M. 08/01/2019 11:00 A M Medical Record Number: [id number] Patient Account Number: [id number] Date of Birth/Sex: Treating RN: 03-26-1955 (64 y.o. [name] Primary Care Malyk Girouard: SYSTEM, PCP Other Clinician: Referring Elester Apodaca: Treating Adya Wirz/Extender: Lily Peer in Treatment: 109 Active Inactive Wound/Skin Impairment Nursing Diagnoses: Impaired tissue integrity Goals: Patient/caregiver will verbalize understanding of skin care regimen Date Initiated: 09/07/2017 Target Resolution Date: 09/13/2019 Goal Status: Active Ulcer/skin breakdown will have a volume reduction of 30% by week 4 Date Initiated: 06/29/2017 Date Inactivated: 08/04/2017 Target Resolution Date: 09/01/2017 Goal Status: Unmet Unmet Reason: larger Interventions: Assess patient/caregiver ability to obtain necessary supplies Assess patient/caregiver ability to perform ulcer/skin care regimen upon admission and as needed Assess ulceration(s) every visit Provide education on ulcer and skin care Notes: Electronic Signature(s) Signed: 08/01/2019 5:25:48 PM By: Deon Pilling Entered By: Deon Pilling on 08/01/2019 12:48:51 -------------------------------------------------------------------------------- Pain Assessment Details Patient Name: Date of Service: [name] Siren M. 08/01/2019 11:00 A M Medical Record Number: [id number] Patient Account Number: [id number] Date of Birth/Sex: Treating RN: Apr 02, 1955 (64 y.o. Orvan Falconer Primary Care Dallen Bunte: SYSTEM, PCP Other Clinician: Referring Tilford Deaton: Treating Crystallynn Noorani/Extender: Lily Peer in Treatment: 109 Active  Problems Location of Pain Severity and Description of Pain Patient Has Paino No Site Locations Pain Management and Medication Current Pain Management: Electronic Signature(s) Signed: 08/07/2019 9:54:21 AM By: Carlene Coria RN Entered By: Carlene Coria on 08/01/2019 11:29:54 -------------------------------------------------------------------------------- Patient/Caregiver Education Details Patient Name: Date of Service: [name] [name] 6/10/2021andnbsp11:00 [facility] Record Number: [id number] Patient Account Number: [id number] Date of Birth/Gender: Treating RN: 18-Aug-1955 (64 y.o. [name] Primary Care Physician: SYSTEM, PCP Other Clinician: Referring Physician: Treating Physician/Extender: Lily Peer in Treatment: 109 Education Assessment Education Provided To: Patient Education Topics Provided Wound/Skin Impairment: Handouts: Skin Care Do's and Dont's Methods: Explain/Verbal Responses: Reinforcements needed Electronic Signature(s) Signed: 08/01/2019 5:25:48 PM By: Deon Pilling Entered By: Deon Pilling on 08/01/2019 12:49:01 -------------------------------------------------------------------------------- Wound Assessment Details Patient Name: Date of Service: [name] Siren M. 08/01/2019 11:00 A M Medical Record Number: [id number] Patient Account Number: [id number] Date of Birth/Sex: Treating RN: 1955/11/22 (64 y.o. Orvan Falconer Primary Care Kniyah Khun: SYSTEM, PCP Other Clinician: Referring Britany Callicott: Treating Fern Canova/Extender: Lily Peer in Treatment:  109 Wound Status Wound Number: 3 Primary Dehisced Wound Etiology: Wound Location: Right Abdomen - Lower Quadrant Wound Open Wounding Event: Gradually Appeared Status: Date Acquired: 12/07/2017 Comorbid Cataracts, Lymphedema, Sleep Apnea, Hypertension, Type II Weeks Of Treatment: 85 History: Diabetes, End Stage Renal Disease, Gout,  Rheumatoid Arthritis, Clustered Wound: No Neuropathy Photos Photo Uploaded By: Mikeal Hawthorne on 08/07/2019 09:40:48 Wound Measurements Length: (cm) 5 Width: (cm) 1.5 Depth: (cm) 1.2 Area: (cm) 5.89 Volume: (cm) 7.069 % Reduction in Area: 9.1% % Reduction in Volume: -21.2% Epithelialization: Medium (34-66%) Tunneling: No Undermining: No Wound Description Classification: Full Thickness Without Exposed Support Structures Wound Margin: Distinct, outline attached Exudate Amount: Medium Exudate Type: Serosanguineous Exudate Color: red, brown Foul Odor After Cleansing: No Slough/Fibrino No Wound Bed Granulation Amount: Large (67-100%) Exposed Structure Granulation Quality: Pink Fascia Exposed: No Necrotic Amount: None Present (0%) Fat Layer (Subcutaneous Tissue) Exposed: Yes Tendon Exposed: No Muscle Exposed: No Joint Exposed: No Bone Exposed: No Treatment Notes Wound #3 (Right Abdomen - Lower Quadrant) 1. Cleanse With Wound Cleanser 3. Primary Dressing Applied Polymem Ag 4. Secondary Dressing Dry Gauze 5. Secured With Recruitment consultant) Signed: 08/07/2019 9:54:21 AM By: Carlene Coria RN Entered By: Carlene Coria on 08/01/2019 11:42:09 -------------------------------------------------------------------------------- Wound Assessment Details Patient Name: Date of Service: [name] Siren M. 08/01/2019 11:00 A M Medical Record Number: [id number] Patient Account Number: [id number] Date of Birth/Sex: Treating RN: 03-03-1955 (64 y.o. Orvan Falconer Primary Care Ronika Kelson: SYSTEM, PCP Other Clinician: Referring Dorothee Napierkowski: Treating Kashara Blocher/Extender: Lily Peer in Treatment: 109 Wound Status Wound Number: 7 Primary Inflammatory Etiology: Wound Location: Right, Proximal Flank Wound Open Wounding Event: Gradually Appeared Status: Date Acquired: 04/17/2019 Comorbid Cataracts, Lymphedema, Sleep Apnea, Hypertension, Type II Weeks  Of Treatment: 14 History: Diabetes, End Stage Renal Disease, Gout, Rheumatoid Arthritis, Clustered Wound: No Neuropathy Photos Photo Uploaded By: Mikeal Hawthorne on 08/07/2019 09:40:49 Wound Measurements Length: (cm) 0.3 Width: (cm) 1.2 Depth: (cm) 0.1 Area: (cm) 0.283 Volume: (cm) 0.028 % Reduction in Area: -71.5% % Reduction in Volume: -75% Epithelialization: Large (67-100%) Tunneling: No Undermining: No Wound Description Classification: Full Thickness Without Exposed Support Structu Wound Margin: Flat and Intact Exudate Amount: Small Exudate Type: Serosanguineous Exudate Color: red, brown res Foul Odor After Cleansing: No Slough/Fibrino No Wound Bed Granulation Amount: Large (67-100%) Exposed Structure Granulation Quality: Red Fascia Exposed: No Necrotic Amount: None Present (0%) Fat Layer (Subcutaneous Tissue) Exposed: Yes Tendon Exposed: No Muscle Exposed: No Joint Exposed: No Bone Exposed: No Treatment Notes Wound #7 (Right, Proximal Flank) 1. Cleanse With Wound Cleanser 3. Primary Dressing Applied Calcium Alginate Ag 4. Secondary Dressing Dry Gauze 5. Secured With Recruitment consultant) Signed: 08/07/2019 9:54:21 AM By: Carlene Coria RN Entered By: Carlene Coria on 08/01/2019 11:42:26 -------------------------------------------------------------------------------- Wound Assessment Details Patient Name: Date of Service: [name] Siren M. 08/01/2019 11:00 A M Medical Record Number: [id number] Patient Account Number: [id number] Date of Birth/Sex: Treating RN: 1955-11-10 (64 y.o. Orvan Falconer Primary Care Percy Comp: SYSTEM, PCP Other Clinician: Referring Indio Santilli: Treating Latacha Texeira/Extender: Lily Peer in Treatment: 109 Wound Status Wound Number: 8 Primary Lesion Etiology: Wound Location: Left Flank Wound Open Wounding Event: Gradually Appeared Status: Date Acquired: 07/23/2019 Comorbid Cataracts, Lymphedema,  Sleep Apnea, Hypertension, Type II Weeks Of Treatment: 0 History: Diabetes, End Stage Renal Disease, Gout, Rheumatoid Arthritis, Clustered Wound: No Neuropathy Photos Photo Uploaded By: Mikeal Hawthorne on 08/07/2019 09:59:21 Wound Measurements Length: (cm) 1.7 Width: (cm) 2.5 Depth: (cm) 0.3 Area: (cm)  3.338 Volume: (cm) 1.001 % Reduction in Area: % Reduction in Volume: Epithelialization: None Tunneling: No Undermining: No Wound Description Classification: Full Thickness Without Exposed Support Structures Exudate Amount: Medium Exudate Type: Serosanguineous Exudate Color: red, brown Foul Odor After Cleansing: No Slough/Fibrino Yes Wound Bed Granulation Amount: Large (67-100%) Exposed Structure Granulation Quality: Red Fascia Exposed: No Necrotic Amount: Small (1-33%) Fat Layer (Subcutaneous Tissue) Exposed: Yes Necrotic Quality: Adherent Slough Tendon Exposed: No Muscle Exposed: No Joint Exposed: No Bone Exposed: No Treatment Notes Wound #8 (Left Flank) 1. Cleanse With Wound Cleanser 3. Primary Dressing Applied Calcium Alginate Ag 4. Secondary Dressing Dry Gauze 5. Secured With Recruitment consultant) Signed: 08/07/2019 9:54:21 AM By: Carlene Coria RN Entered By: Carlene Coria on 08/01/2019 11:41:21 -------------------------------------------------------------------------------- Vitals Details Patient Name: Date of Service: [name] Siren M. 08/01/2019 11:00 A M Medical Record Number: [id number] Patient Account Number: [id number] Date of Birth/Sex: Treating RN: 1955-09-04 (64 y.o. Orvan Falconer Primary Care Cory Kitt: SYSTEM, PCP Other Clinician: Referring Ardie Dragoo: Treating Shalea Tomczak/Extender: Lily Peer in Treatment: 109 Vital Signs Time Taken: 11:29 Temperature (F): 97.6 Height (in): 62 Pulse (bpm): 76 Weight (lbs): 260 Respiratory Rate (breaths/min): 18 Body Mass Index (BMI): 47.5 Blood Pressure (mmHg):  144/78 Reference Range: 80 - 120 mg / dl Electronic Signature(s) Signed: 08/07/2019 9:54:21 AM By: Carlene Coria RN Entered By: Carlene Coria on 08/01/2019 11:29:47

## 2019-08-16 ENCOUNTER — Other Ambulatory Visit: Payer: Self-pay

## 2019-08-16 ENCOUNTER — Emergency Department (HOSPITAL_COMMUNITY)
Admission: EM | Admit: 2019-08-16 | Discharge: 2019-08-16 | Disposition: A | Discharge status: Home / Self Care | Payer: No Typology Code available for payment source | Attending: Emergency Medicine | Admitting: Emergency Medicine

## 2019-08-16 ENCOUNTER — Encounter (HOSPITAL_COMMUNITY): Payer: Self-pay | Admitting: Student

## 2019-08-16 ENCOUNTER — Emergency Department (HOSPITAL_COMMUNITY): Payer: No Typology Code available for payment source

## 2019-08-16 DIAGNOSIS — M25562 Pain in left knee: Secondary | ICD-10-CM | POA: Diagnosis present

## 2019-08-16 DIAGNOSIS — Z87891 Personal history of nicotine dependence: Secondary | ICD-10-CM | POA: Diagnosis not present

## 2019-08-16 DIAGNOSIS — Y9389 Activity, other specified: Secondary | ICD-10-CM | POA: Diagnosis not present

## 2019-08-16 DIAGNOSIS — Y999 Unspecified external cause status: Secondary | ICD-10-CM | POA: Diagnosis not present

## 2019-08-16 DIAGNOSIS — R52 Pain, unspecified: Secondary | ICD-10-CM

## 2019-08-16 DIAGNOSIS — Y9289 Other specified places as the place of occurrence of the external cause: Secondary | ICD-10-CM | POA: Diagnosis not present

## 2019-08-16 DIAGNOSIS — W01198A Fall on same level from slipping, tripping and stumbling with subsequent striking against other object, initial encounter: Secondary | ICD-10-CM | POA: Diagnosis not present

## 2019-08-16 DIAGNOSIS — E1122 Type 2 diabetes mellitus with diabetic chronic kidney disease: Secondary | ICD-10-CM | POA: Insufficient documentation

## 2019-08-16 DIAGNOSIS — E669 Obesity, unspecified: Secondary | ICD-10-CM | POA: Insufficient documentation

## 2019-08-16 DIAGNOSIS — N184 Chronic kidney disease, stage 4 (severe): Secondary | ICD-10-CM | POA: Insufficient documentation

## 2019-08-16 DIAGNOSIS — Z79899 Other long term (current) drug therapy: Secondary | ICD-10-CM | POA: Insufficient documentation

## 2019-08-16 DIAGNOSIS — W19XXXA Unspecified fall, initial encounter: Secondary | ICD-10-CM

## 2019-08-16 DIAGNOSIS — I129 Hypertensive chronic kidney disease with stage 1 through stage 4 chronic kidney disease, or unspecified chronic kidney disease: Secondary | ICD-10-CM | POA: Diagnosis not present

## 2019-08-16 MED ORDER — OXYCODONE HCL 5 MG PO TABS
5.0000 mg | ORAL_TABLET | Freq: Once | ORAL | Status: AC
  Administered 2019-08-16: 5 mg via ORAL
  Filled 2019-08-16: qty 1

## 2019-08-16 MED ORDER — KETOROLAC TROMETHAMINE 15 MG/ML IJ SOLN
15.0000 mg | Freq: Once | INTRAMUSCULAR | Status: AC
  Administered 2019-08-16: 15 mg via INTRAMUSCULAR
  Filled 2019-08-16: qty 1

## 2019-08-16 MED ORDER — OXYCODONE-ACETAMINOPHEN 5-325 MG PO TABS
1.0000 | ORAL_TABLET | Freq: Once | ORAL | Status: AC
  Administered 2019-08-16: 1 via ORAL
  Filled 2019-08-16: qty 1

## 2019-08-16 NOTE — ED Notes (Addendum)
Patient arrives via [facility] from home after a fall. Patient was in her bathroom when her left knee gave out and she feel, did hit her head on the wall.  Denies any LOC, no pain in her head but 8/10 left knee pain. Home health present when patient fell. Vitals with EMS were 106/62, 80-P, 97% RA, 97.7 temp, and 163 CBG.
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
## 2019-08-16 NOTE — ED Notes (Signed)
X-ray at bedside

## 2019-08-16 NOTE — ED Notes (Signed)
Patient is calling a ride to come and pick her up.

## 2019-08-16 NOTE — Discharge Instructions (Signed)
Continue taking home medications as prescribed, including your pain medicine. Use ice help with pain and swelling. You will likely have continued pain over the next several days.  Follow-up with orthopedics if your pain is not improving. Return to the emergency room if you develop any new, worsening, or concerning symptoms.

## 2019-08-16 NOTE — ED Notes (Signed)
Applied ice to left knee and hip/ Repositioned for comfort

## 2019-08-19 ENCOUNTER — Telehealth: Payer: Self-pay | Admitting: Family Medicine

## 2019-08-19 NOTE — Telephone Encounter (Signed)
Called patient left message to return call to schedule an appointment with Dr. Junius Roads for left knee pain

## 2019-08-19 NOTE — Telephone Encounter (Signed)
I am not sure why you sent me this.

## 2019-08-19 NOTE — Telephone Encounter (Signed)
Called 1 x left voicmail to set appt. Seen at [facility] ED for left knee pain

## 2019-08-20 ENCOUNTER — Other Ambulatory Visit: Payer: Self-pay

## 2019-08-20 ENCOUNTER — Emergency Department (HOSPITAL_COMMUNITY): Payer: No Typology Code available for payment source

## 2019-08-20 ENCOUNTER — Inpatient Hospital Stay (HOSPITAL_COMMUNITY)
Admission: EM | Admit: 2019-08-20 | Discharge: 2019-08-23 | DRG: 918 | Disposition: A | Discharge status: Home Health | Payer: No Typology Code available for payment source | Attending: Internal Medicine | Admitting: Internal Medicine

## 2019-08-20 ENCOUNTER — Encounter (HOSPITAL_COMMUNITY): Payer: Self-pay

## 2019-08-20 DIAGNOSIS — Z79899 Other long term (current) drug therapy: Secondary | ICD-10-CM

## 2019-08-20 DIAGNOSIS — R0902 Hypoxemia: Secondary | ICD-10-CM | POA: Diagnosis present

## 2019-08-20 DIAGNOSIS — N183 Chronic kidney disease, stage 3 unspecified: Secondary | ICD-10-CM | POA: Diagnosis present

## 2019-08-20 DIAGNOSIS — N179 Acute kidney failure, unspecified: Secondary | ICD-10-CM

## 2019-08-20 DIAGNOSIS — E1140 Type 2 diabetes mellitus with diabetic neuropathy, unspecified: Secondary | ICD-10-CM | POA: Diagnosis present

## 2019-08-20 DIAGNOSIS — E785 Hyperlipidemia, unspecified: Secondary | ICD-10-CM | POA: Diagnosis present

## 2019-08-20 DIAGNOSIS — T07XXXA Unspecified multiple injuries, initial encounter: Secondary | ICD-10-CM | POA: Diagnosis present

## 2019-08-20 DIAGNOSIS — T402X1A Poisoning by other opioids, accidental (unintentional), initial encounter: Principal | ICD-10-CM | POA: Diagnosis present

## 2019-08-20 DIAGNOSIS — T50901A Poisoning by unspecified drugs, medicaments and biological substances, accidental (unintentional), initial encounter: Secondary | ICD-10-CM

## 2019-08-20 DIAGNOSIS — T40601A Poisoning by unspecified narcotics, accidental (unintentional), initial encounter: Secondary | ICD-10-CM | POA: Diagnosis present

## 2019-08-20 DIAGNOSIS — Z87891 Personal history of nicotine dependence: Secondary | ICD-10-CM

## 2019-08-20 DIAGNOSIS — L8990 Pressure ulcer of unspecified site, unspecified stage: Secondary | ICD-10-CM | POA: Diagnosis present

## 2019-08-20 DIAGNOSIS — I509 Heart failure, unspecified: Secondary | ICD-10-CM | POA: Diagnosis present

## 2019-08-20 DIAGNOSIS — G8929 Other chronic pain: Secondary | ICD-10-CM | POA: Diagnosis present

## 2019-08-20 DIAGNOSIS — E872 Acidosis: Secondary | ICD-10-CM | POA: Diagnosis present

## 2019-08-20 DIAGNOSIS — R296 Repeated falls: Secondary | ICD-10-CM | POA: Diagnosis present

## 2019-08-20 DIAGNOSIS — Z20822 Contact with and (suspected) exposure to covid-19: Secondary | ICD-10-CM | POA: Diagnosis present

## 2019-08-20 DIAGNOSIS — F419 Anxiety disorder, unspecified: Secondary | ICD-10-CM | POA: Diagnosis present

## 2019-08-20 DIAGNOSIS — Z6841 Body Mass Index (BMI) 40.0 and over, adult: Secondary | ICD-10-CM

## 2019-08-20 DIAGNOSIS — E875 Hyperkalemia: Secondary | ICD-10-CM | POA: Diagnosis not present

## 2019-08-20 DIAGNOSIS — F329 Major depressive disorder, single episode, unspecified: Secondary | ICD-10-CM | POA: Diagnosis present

## 2019-08-20 DIAGNOSIS — Y929 Unspecified place or not applicable: Secondary | ICD-10-CM

## 2019-08-20 DIAGNOSIS — Z833 Family history of diabetes mellitus: Secondary | ICD-10-CM

## 2019-08-20 DIAGNOSIS — N184 Chronic kidney disease, stage 4 (severe): Secondary | ICD-10-CM | POA: Diagnosis present

## 2019-08-20 DIAGNOSIS — E1122 Type 2 diabetes mellitus with diabetic chronic kidney disease: Secondary | ICD-10-CM | POA: Diagnosis present

## 2019-08-20 DIAGNOSIS — Z880 Allergy status to penicillin: Secondary | ICD-10-CM

## 2019-08-20 DIAGNOSIS — Z9181 History of falling: Secondary | ICD-10-CM

## 2019-08-20 DIAGNOSIS — Z79891 Long term (current) use of opiate analgesic: Secondary | ICD-10-CM

## 2019-08-20 DIAGNOSIS — G4733 Obstructive sleep apnea (adult) (pediatric): Secondary | ICD-10-CM | POA: Diagnosis present

## 2019-08-20 DIAGNOSIS — M793 Panniculitis, unspecified: Secondary | ICD-10-CM | POA: Diagnosis present

## 2019-08-20 DIAGNOSIS — Z885 Allergy status to narcotic agent status: Secondary | ICD-10-CM

## 2019-08-20 DIAGNOSIS — G4730 Sleep apnea, unspecified: Secondary | ICD-10-CM | POA: Diagnosis present

## 2019-08-20 DIAGNOSIS — I130 Hypertensive heart and chronic kidney disease with heart failure and stage 1 through stage 4 chronic kidney disease, or unspecified chronic kidney disease: Secondary | ICD-10-CM | POA: Diagnosis present

## 2019-08-20 LAB — CBC WITH DIFFERENTIAL/PLATELET
Abs Immature Granulocytes: 0.07 10*3/uL (ref 0.00–0.07)
Basophils Absolute: 0.1 10*3/uL (ref 0.0–0.1)
Basophils Relative: 1 %
Eosinophils Absolute: 0.4 10*3/uL (ref 0.0–0.5)
Eosinophils Relative: 5 %
HCT: 34.7 % — ABNORMAL LOW (ref 36.0–46.0)
Hemoglobin: 10 g/dL — ABNORMAL LOW (ref 12.0–15.0)
Immature Granulocytes: 1 %
Lymphocytes Relative: 18 %
Lymphs Abs: 1.4 10*3/uL (ref 0.7–4.0)
MCH: 25.9 pg — ABNORMAL LOW (ref 26.0–34.0)
MCHC: 28.8 g/dL — ABNORMAL LOW (ref 30.0–36.0)
MCV: 89.9 fL (ref 80.0–100.0)
Monocytes Absolute: 0.7 10*3/uL (ref 0.1–1.0)
Monocytes Relative: 9 %
Neutro Abs: 5.1 10*3/uL (ref 1.7–7.7)
Neutrophils Relative %: 66 %
Platelets: 471 10*3/uL — ABNORMAL HIGH (ref 150–400)
RBC: 3.86 MIL/uL — ABNORMAL LOW (ref 3.87–5.11)
RDW: 18 % — ABNORMAL HIGH (ref 11.5–15.5)
WBC: 7.7 10*3/uL (ref 4.0–10.5)
nRBC: 1.6 % — ABNORMAL HIGH (ref 0.0–0.2)

## 2019-08-20 LAB — I-STAT ARTERIAL BLOOD GAS, ED
Acid-base deficit: 4 mmol/L — ABNORMAL HIGH (ref 0.0–2.0)
Acid-base deficit: 3 mmol/L — ABNORMAL HIGH (ref 0.0–2.0)
Acid-base deficit: 3 mmol/L — ABNORMAL HIGH (ref 0.0–2.0)
Bicarbonate: 25.2 mmol/L (ref 20.0–28.0)
Bicarbonate: 24.3 mmol/L (ref 20.0–28.0)
Bicarbonate: 23.7 mmol/L (ref 20.0–28.0)
Calcium, Ion: 1.28 mmol/L (ref 1.15–1.40)
Calcium, Ion: 1.26 mmol/L (ref 1.15–1.40)
Calcium, Ion: 1.24 mmol/L (ref 1.15–1.40)
HCT: 32 % — ABNORMAL LOW (ref 36.0–46.0)
HCT: 32 % — ABNORMAL LOW (ref 36.0–46.0)
HCT: 32 % — ABNORMAL LOW (ref 36.0–46.0)
Hemoglobin: 10.9 g/dL — ABNORMAL LOW (ref 12.0–15.0)
Hemoglobin: 10.9 g/dL — ABNORMAL LOW (ref 12.0–15.0)
Hemoglobin: 10.9 g/dL — ABNORMAL LOW (ref 12.0–15.0)
O2 Saturation: 79 %
O2 Saturation: 81 %
O2 Saturation: 96 %
Patient temperature: 98.6
Patient temperature: 98.6
Patient temperature: 97.7
Potassium: 5.5 mmol/L — ABNORMAL HIGH (ref 3.5–5.1)
Potassium: 5.3 mmol/L — ABNORMAL HIGH (ref 3.5–5.1)
Potassium: 5 mmol/L (ref 3.5–5.1)
Sodium: 140 mmol/L (ref 135–145)
Sodium: 139 mmol/L (ref 135–145)
Sodium: 140 mmol/L (ref 135–145)
TCO2: 27 mmol/L (ref 22–32)
TCO2: 26 mmol/L (ref 22–32)
TCO2: 25 mmol/L (ref 22–32)
pCO2 arterial: 64.8 mmHg — ABNORMAL HIGH (ref 32.0–48.0)
pCO2 arterial: 53.1 mmHg — ABNORMAL HIGH (ref 32.0–48.0)
pCO2 arterial: 49.1 mmHg — ABNORMAL HIGH (ref 32.0–48.0)
pH, Arterial: 7.198 — CL (ref 7.350–7.450)
pH, Arterial: 7.269 — ABNORMAL LOW (ref 7.350–7.450)
pH, Arterial: 7.289 — ABNORMAL LOW (ref 7.350–7.450)
pO2, Arterial: 55 mmHg — ABNORMAL LOW (ref 83.0–108.0)
pO2, Arterial: 53 mmHg — ABNORMAL LOW (ref 83.0–108.0)
pO2, Arterial: 87 mmHg (ref 83.0–108.0)

## 2019-08-20 LAB — COMPREHENSIVE METABOLIC PANEL
ALT: 21 U/L (ref 0–44)
AST: 31 U/L (ref 15–41)
Albumin: 3.2 g/dL — ABNORMAL LOW (ref 3.5–5.0)
Alkaline Phosphatase: 102 U/L (ref 38–126)
Anion gap: 10 (ref 5–15)
BUN: 66 mg/dL — ABNORMAL HIGH (ref 8–23)
CO2: 23 mmol/L (ref 22–32)
Calcium: 8.9 mg/dL (ref 8.9–10.3)
Chloride: 106 mmol/L (ref 98–111)
Creatinine, Ser: 3.89 mg/dL — ABNORMAL HIGH (ref 0.44–1.00)
GFR calc Af Amer: 13 mL/min — ABNORMAL LOW (ref >60)
GFR calc non Af Amer: 11 mL/min — ABNORMAL LOW (ref >60)
Glucose, Bld: 144 mg/dL — ABNORMAL HIGH (ref 70–99)
Potassium: 5.7 mmol/L — ABNORMAL HIGH (ref 3.5–5.1)
Sodium: 139 mmol/L (ref 135–145)
Total Bilirubin: 0.2 mg/dL — ABNORMAL LOW (ref 0.3–1.2)
Total Protein: 7.4 g/dL (ref 6.5–8.1)

## 2019-08-20 LAB — ACETAMINOPHEN LEVEL: Acetaminophen (Tylenol), Serum: <10 ug/mL — ABNORMAL LOW (ref 10–30)

## 2019-08-20 LAB — BRAIN NATRIURETIC PEPTIDE: B Natriuretic Peptide: 511 pg/mL — ABNORMAL HIGH (ref 0.0–100.0)

## 2019-08-20 LAB — RAPID URINE DRUG SCREEN, HOSP PERFORMED
Amphetamines: NOT DETECTED
Barbiturates: NOT DETECTED
Benzodiazepines: POSITIVE — AB
Cocaine: NOT DETECTED
Opiates: POSITIVE — AB
Tetrahydrocannabinol: NOT DETECTED

## 2019-08-20 LAB — URINALYSIS, ROUTINE W REFLEX MICROSCOPIC
Bilirubin Urine: NEGATIVE
Glucose, UA: NEGATIVE mg/dL
Hgb urine dipstick: NEGATIVE
Ketones, ur: NEGATIVE mg/dL
Leukocytes,Ua: NEGATIVE
Nitrite: NEGATIVE
Protein, ur: 30 mg/dL — AB
Specific Gravity, Urine: 1.015 (ref 1.005–1.030)
pH: 5 (ref 5.0–8.0)

## 2019-08-20 LAB — ETHANOL: Alcohol, Ethyl (B): <10 mg/dL (ref <10)

## 2019-08-20 LAB — CBG MONITORING, ED: Glucose-Capillary: 136 mg/dL — ABNORMAL HIGH (ref 70–99)

## 2019-08-20 LAB — SARS CORONAVIRUS 2 BY RT PCR (HOSPITAL ORDER, PERFORMED IN ~~LOC~~ HOSPITAL LAB): SARS Coronavirus 2: NEGATIVE

## 2019-08-20 LAB — LACTIC ACID, PLASMA: Lactic Acid, Venous: 0.9 mmol/L (ref 0.5–1.9)

## 2019-08-20 LAB — SALICYLATE LEVEL: Salicylate Lvl: <7 mg/dL — ABNORMAL LOW (ref 7.0–30.0)

## 2019-08-20 LAB — AMMONIA: Ammonia: 26 umol/L (ref 9–35)

## 2019-08-20 MED ORDER — DULOXETINE HCL 30 MG PO CPEP
30.0000 mg | ORAL_CAPSULE | Freq: Two times a day (BID) | ORAL | Status: DC
  Administered 2019-08-20 – 2019-08-23 (×7): 30 mg via ORAL
  Filled 2019-08-20 (×8): qty 1

## 2019-08-20 MED ORDER — NALOXONE HCL 0.4 MG/ML IJ SOLN
0.4000 mg | Freq: Once | INTRAMUSCULAR | Status: AC
  Administered 2019-08-20: 0.4 mg via INTRAVENOUS
  Filled 2019-08-20: qty 1

## 2019-08-20 MED ORDER — SODIUM ZIRCONIUM CYCLOSILICATE 10 G PO PACK
10.0000 g | PACK | Freq: Once | ORAL | Status: AC
  Administered 2019-08-20: 10 g via ORAL
  Filled 2019-08-20: qty 1

## 2019-08-20 MED ORDER — HYDROXYZINE HCL 25 MG PO TABS: 50.0000 mg | ORAL_TABLET | Freq: Three times a day (TID) | ORAL | Status: DC | PRN

## 2019-08-20 MED ORDER — DOXYCYCLINE HYCLATE 100 MG PO TABS
100.0000 mg | ORAL_TABLET | Freq: Two times a day (BID) | ORAL | Status: DC
  Administered 2019-08-20 – 2019-08-23 (×7): 100 mg via ORAL
  Filled 2019-08-20 (×7): qty 1

## 2019-08-20 MED ORDER — MIRTAZAPINE 15 MG PO TABS
30.0000 mg | ORAL_TABLET | Freq: Every day | ORAL | Status: DC
  Administered 2019-08-21 – 2019-08-23 (×3): 30 mg via ORAL
  Filled 2019-08-20 (×3): qty 2

## 2019-08-20 MED ORDER — RAMELTEON 8 MG PO TABS
8.0000 mg | ORAL_TABLET | Freq: Every evening | ORAL | Status: DC | PRN
  Administered 2019-08-20 – 2019-08-22 (×3): 8 mg via ORAL
  Filled 2019-08-20 (×4): qty 1

## 2019-08-20 MED ORDER — SODIUM CHLORIDE 0.9 % IV SOLN: INTRAVENOUS | Status: AC

## 2019-08-20 MED ORDER — PANTOPRAZOLE SODIUM 40 MG PO TBEC
40.0000 mg | DELAYED_RELEASE_TABLET | Freq: Every day | ORAL | Status: DC
  Administered 2019-08-21 – 2019-08-23 (×3): 40 mg via ORAL
  Filled 2019-08-20 (×3): qty 1

## 2019-08-20 MED ORDER — ALPRAZOLAM 0.5 MG PO TABS
1.0000 mg | ORAL_TABLET | Freq: Three times a day (TID) | ORAL | Status: DC | PRN
  Administered 2019-08-21 – 2019-08-23 (×3): 1 mg via ORAL
  Filled 2019-08-20 (×3): qty 2

## 2019-08-20 MED ORDER — SODIUM CHLORIDE 0.9 % IV BOLUS
500.0000 mL | Freq: Once | INTRAVENOUS | Status: AC
  Administered 2019-08-20: 500 mL via INTRAVENOUS

## 2019-08-20 MED ORDER — ACETAMINOPHEN 325 MG PO TABS
650.0000 mg | ORAL_TABLET | Freq: Four times a day (QID) | ORAL | Status: DC | PRN
  Administered 2019-08-21: 650 mg via ORAL
  Filled 2019-08-20: qty 2

## 2019-08-20 MED ORDER — ENOXAPARIN SODIUM 40 MG/0.4ML ~~LOC~~ SOLN
40.0000 mg | SUBCUTANEOUS | Status: DC
  Administered 2019-08-20 – 2019-08-22 (×3): 40 mg via SUBCUTANEOUS
  Filled 2019-08-20 (×3): qty 0.4

## 2019-08-20 NOTE — ED Provider Notes (Signed)
Angiocath insertion Performed by: Ephraim Hamburger  Consent: Verbal consent obtained. Risks and benefits: risks, benefits and alternatives were discussed Time out: Immediately prior to procedure a "time out" was called to verify the correct patient, procedure, equipment, support staff and site/side marked as required.  Preparation: Patient was prepped and draped in the usual sterile fashion.  Vein Location: right EJ  Ultrasound Guided  Gauge: 20  Normal blood return and flush without difficulty Patient tolerance: Patient tolerated the procedure well with no immediate complications.      Sherwood Gambler, MD 08/20/19 [phone]

## 2019-08-20 NOTE — ED Triage Notes (Addendum)
Pt from home via ems; lives by self, had caretaker during day; LSN yesterday afternoon around 6 or 12; on caretaker arrival, pt noted to be unresponsive, sats in 8s on RA with fire department; lying flat on arrival; caretaker states pt has prescription for percocet that was filled on June 18th, 90 count; completely empty now; pt self administers meds; normally a and o x 4; c/o L leg pain;caretaker reports fall on Friday; evaluated at [facility]; no deformity, some swelling; hx CHF; more responsive once sitting up  P 74 RR 18 99% 2L 169 CBG 97.8 148/92

## 2019-08-20 NOTE — ED Notes (Signed)
Pt more alert after narcan administration
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
## 2019-08-20 NOTE — H&P (Signed)
Date: 08/20/2019               Patient Name:  [name] [name] MRN: [id number]  DOB: 06-05-1955 Age / Sex: 64 y.o., female   PCP: [name], MD         Medical Service: Internal Medicine Teaching Service         Attending Physician: Dr. Aldine Contes, MD    First Contact: Marianna Payment, DO, [facility] Pager: [facility] [phone])  Second Contact: Sharon Seller DO, Jaimie PagerJari Pigg [phone])       After Hours (After 5p/  First Contact Pager: [phone]  weekends / holidays): Second Contact Pager: [phone]   Chief Complaint: AMS   History of Present Illness: [name] [name] is a 64 yo female with a pertinent PMH of CHF, type 2 diabetes, hypertension, diabetic neuropathy, CKD stage IV, sleep apnea who presented with a chief complaint of AMS.  Patient presents from home after ingestion of a large quantity of opioid pain mediations and subsequently became somnolent. Patient also admits to multiple falls over the past several days and as a result had left knee and ankle pain. On evaluation today the patient states her chronic joint pain was progressed over the last few weeks causing her to take more of her prescription pain medication. She denies suicidal ideation. 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   ED Course: In the ED patient was somnolent with hypercarbic respiratory acidosis that improved with narcan.    Lab Orders     Urine culture     Culture, blood (routine x 2)     SARS Coronavirus 2 by RT PCR (hospital order, performed in [facility] hospital lab) Nasopharyngeal Nasopharyngeal Swab     Comprehensive metabolic panel     Ethanol     Urinalysis, Routine w reflex microscopic     Urine rapid drug screen (hosp performed)     CBC with Differential     Brain natriuretic peptide     Acetaminophen level     Salicylate level     Ammonia     Basic metabolic panel     CBC     Blood gas, arterial     Sodium, urine, random     Creatinine, urine, random     POC  CBG, ED     I-Stat arterial blood gas, ([place] ED)     I-Stat arterial blood gas, ([place] ED)     I-Stat arterial blood gas, ED   Meds:  Current Meds  Medication Sig  . allopurinol (ZYLOPRIM) 100 MG tablet Take 100 mg by mouth 2 (two) times daily.  Marland Kitchen ALPRAZolam (XANAX) 1 MG tablet Take 1 mg by mouth 3 (three) times daily as needed for anxiety.  Marland Kitchen atenolol-chlorthalidone (TENORETIC) 50-25 MG tablet Take 1 tablet by mouth daily.  Marland Kitchen atorvastatin (LIPITOR) 10 MG tablet Take 10 mg by mouth daily.  . DULoxetine (CYMBALTA) 30 MG capsule Take 30 mg by mouth 2 (two) times daily.  . furosemide (LASIX) 40 MG tablet Take 40 mg by mouth.  . gabapentin (NEURONTIN) 600 MG tablet Take 600 mg by mouth 3 (three) times daily.  . hydrOXYzine (ATARAX/VISTARIL) 50 MG tablet Take 50 mg by mouth every 8 (eight) hours as needed for anxiety or itching.   Marland Kitchen ibuprofen (ADVIL) 800 MG tablet Take 800 mg by mouth every 8 (eight) hours as needed for moderate pain.  . mirtazapine (REMERON) 30 MG tablet Take 30 mg by mouth daily.  Marland Kitchen  NYSTATIN powder Apply 1 application topically 3 (three) times daily.   Marland Kitchen oxyCODONE-acetaminophen (PERCOCET) 10-325 MG tablet Take 1 tablet by mouth every 8 (eight) hours as needed for pain.   . pantoprazole (PROTONIX) 40 MG tablet Take 40 mg by mouth daily.  . phentermine (ADIPEX-P) 37.5 MG tablet Take 37.5 mg by mouth daily before breakfast.  . pioglitazone (ACTOS) 30 MG tablet Take 30 mg by mouth daily.  . sitaGLIPtin (JANUVIA) 100 MG tablet Take 100 mg by mouth daily.    Social:  Social History   Socioeconomic History  . Marital status: Single    Spouse name: Not on file  . Number of children: 1  . Years of education: 42  . Highest education level: Not on file  Occupational History  . Occupation: retired English as a second language teacher  Tobacco Use  . Smoking status: Former Research scientist (life sciences)  . Smokeless tobacco: Never Used  Vaping Use  . Vaping Use: Never used  Substance and Sexual Activity  . Alcohol use: Yes     Alcohol/week: 0.0 standard drinks    Comment: socially  . Drug use: No    Types: Marijuana  . Sexual activity: Not on file  Other Topics Concern  . Not on file  Social History Narrative   Lives at home with a friend   Drinks coffee occasionally and some tea   Social Determinants of Health   Financial Resource Strain:   . Difficulty of Paying Living Expenses:   Food Insecurity:   . Worried About Charity fundraiser in the Last Year:   . Arboriculturist in the Last Year:   Transportation Needs:   . Film/video editor (Medical):   Marland Kitchen Lack of Transportation (Non-Medical):   Physical Activity:   . Days of Exercise per Week:   . Minutes of Exercise per Session:   Stress:   . Feeling of Stress :   Social Connections:   . Frequency of Communication with Friends and Family:   . Frequency of Social Gatherings with Friends and Family:   . Attends Religious Services:   . Active Member of Clubs or Organizations:   . Attends Archivist Meetings:   Marland Kitchen Marital Status:   Intimate Partner Violence:   . Fear of Current or Ex-Partner:   . Emotionally Abused:   Marland Kitchen Physically Abused:   . Sexually Abused:      Family History:  Family History  Problem Relation Age of Onset  . Diabetes Mother     Allergies: Allergies as of 08/20/2019 - Review Complete 08/20/2019  Allergen Reaction Noted  . Morphine Rash 01/25/2018  . Penicillins Itching, Nausea And Vomiting, Rash, and Hives 02/29/2012  . Morphine and related Itching 02/29/2012   Past Medical History:  Diagnosis Date  . Diabetes mellitus without complication ([place])   . Hypertension   . Knee pain, chronic   . Neuropathy in diabetes [facility])      Review of Systems: A complete ROS was negative except as per HPI.   Physical Exam: Blood pressure (!) 149/77, pulse 81, temperature 97.7 F (36.5 C), temperature source Oral, resp. rate (!) 21, height 5\' 2"  (1.575 m), weight (!) 149.7 kg, SpO2 97 %. Physical  Exam Constitutional:      Appearance: Normal appearance. She is obese.  HENT:     Head: Normocephalic and atraumatic.  Eyes:     Extraocular Movements: Extraocular movements intact.  Cardiovascular:     Rate and Rhythm: Normal rate.  Pulses: Normal pulses.     Heart sounds: Normal heart sounds.  Pulmonary:     Effort: Pulmonary effort is normal.     Breath sounds: Normal breath sounds.  Abdominal:     General: Bowel sounds are normal.     Palpations: Abdomen is soft.     Tenderness: There is no abdominal tenderness.  Musculoskeletal:        General: Tenderness (left knee and ankle pain. ) present. Normal range of motion.     Cervical back: Normal range of motion.     Right lower leg: No edema.     Left lower leg: No edema.  Skin:    General: Skin is warm and dry.  Neurological:     Mental Status: She is alert and oriented to person, place, and time. Mental status is at baseline.  Psychiatric:        Mood and Affect: Mood normal.    Media Information   Document Information    Media Information   Document Information        Labs: CBC    Component Value Date/Time   WBC 7.7 08/20/2019 1255   RBC 3.86 (L) 08/20/2019 1255   HGB 10.9 (L) 08/20/2019 1901   HCT 32.0 (L) 08/20/2019 1901   PLT 471 (H) 08/20/2019 1255   MCV 89.9 08/20/2019 1255   MCH 25.9 (L) 08/20/2019 1255   MCHC 28.8 (L) 08/20/2019 1255   RDW 18.0 (H) 08/20/2019 1255   LYMPHSABS 1.4 08/20/2019 1255   MONOABS 0.7 08/20/2019 1255   EOSABS 0.4 08/20/2019 1255   BASOSABS 0.1 08/20/2019 1255     CMP     Component Value Date/Time   NA 140 08/20/2019 1901   K 5.0 08/20/2019 1901   CL 106 08/20/2019 1255   CO2 23 08/20/2019 1255   GLUCOSE 144 (H) 08/20/2019 1255   BUN 66 (H) 08/20/2019 1255   CREATININE 3.89 (H) 08/20/2019 1255   CALCIUM 8.9 08/20/2019 1255   PROT 7.4 08/20/2019 1255   ALBUMIN 3.2 (L) 08/20/2019 1255   AST 31 08/20/2019 1255   ALT 21 08/20/2019 1255   ALKPHOS 102  08/20/2019 1255   BILITOT 0.2 (L) 08/20/2019 1255   GFRNONAA 11 (L) 08/20/2019 1255   GFRAA 13 (L) 08/20/2019 1255    Imaging: CT Head Wo Contrast  Significant artifact is present. No acute intracranial abnormality within this limitation.  CT Cervical Spine Wo Contrast No acute cervical spine fracture. A subtle fracture is not completely excluded due to significant artifact. Electronically   DG Chest Portable 1 View No active disease. Electronically Signed   By: Marijo Conception M.D.   On: 08/20/2019 13:10   EKG: personally reviewed my interpretation is sinus rhythm   Assessment & Plan by Problem: Active Problems:   Opiate overdose ([place])   [name] [name] is a 64 y.o. with pertinent PMH of CHF, type 2 diabetes, hypertension, diabetic neuropathy, CKD stage IV, sleep apnea who presented with AMS and admit for AKI on hospital day 0  #Opioid Overdose #Respioratory acidosis Patient is alert and orient and doing well since getting narcan in the ED. Initial ABG showed respiratory acidosis that improved with narcan. Repeat ABG shows improvement of respiratory acidosis likely with a chronic component.  - CPAP tonight   #OSA/OHA: - CPAP overnight.  #Panniculitis: Patient has pannus wounds with purulent discharge. - Start Doxycycline 100 mg   #Hyperkalemia: Patient received 1 dose of lokelma.  - Follow up BMP  in the AM   #AKI Patient presents with acute elevation in Cr likely secondary to pre-renal azotemia.  - Gentle hydration with 75 cc/hr of NS.  #CHF: - Continue home medications   Diet: Heart Healthy VTE: Enoxaparin IVF: NS,75cc/hr Code: Full  Prior to Admission Living Arrangement: Home, living alone Anticipated Discharge Location: SNF Barriers to Discharge: none  Dispo: Admit patient to Observation with expected length of stay less than 2 midnights.  Signed: Marianna Payment, MD 08/20/2019, 8:15 PM  Pager: [phone]

## 2019-08-20 NOTE — ED Notes (Signed)
Pt speaking on phone with family 

## 2019-08-20 NOTE — ED Notes (Signed)
Report given to 5M RN. All questions answered 

## 2019-08-21 LAB — BLOOD CULTURE ID PANEL (REFLEXED)

## 2019-08-21 LAB — CBC
HCT: 32.7 % — ABNORMAL LOW (ref 36.0–46.0)
Hemoglobin: 9.8 g/dL — ABNORMAL LOW (ref 12.0–15.0)
MCH: 26.3 pg (ref 26.0–34.0)
MCHC: 30 g/dL (ref 30.0–36.0)
MCV: 87.9 fL (ref 80.0–100.0)
Platelets: 456 10*3/uL — ABNORMAL HIGH (ref 150–400)
RBC: 3.72 MIL/uL — ABNORMAL LOW (ref 3.87–5.11)
RDW: 17.5 % — ABNORMAL HIGH (ref 11.5–15.5)
WBC: 6.6 10*3/uL (ref 4.0–10.5)
nRBC: 1.1 % — ABNORMAL HIGH (ref 0.0–0.2)

## 2019-08-21 LAB — URINE CULTURE: Culture: NO GROWTH

## 2019-08-21 LAB — BASIC METABOLIC PANEL
Anion gap: 12 (ref 5–15)
BUN: 58 mg/dL — ABNORMAL HIGH (ref 8–23)
CO2: 20 mmol/L — ABNORMAL LOW (ref 22–32)
Calcium: 8.9 mg/dL (ref 8.9–10.3)
Chloride: 108 mmol/L (ref 98–111)
Creatinine, Ser: 2.97 mg/dL — ABNORMAL HIGH (ref 0.44–1.00)
GFR calc Af Amer: 18 mL/min — ABNORMAL LOW (ref >60)
GFR calc non Af Amer: 16 mL/min — ABNORMAL LOW (ref >60)
Glucose, Bld: 149 mg/dL — ABNORMAL HIGH (ref 70–99)
Potassium: 5.1 mmol/L (ref 3.5–5.1)
Sodium: 140 mmol/L (ref 135–145)

## 2019-08-21 LAB — MRSA PCR SCREENING: MRSA by PCR: NEGATIVE

## 2019-08-21 MED ORDER — HYDROXYZINE HCL 25 MG PO TABS
25.0000 mg | ORAL_TABLET | Freq: Four times a day (QID) | ORAL | Status: DC | PRN
  Administered 2019-08-21 – 2019-08-23 (×3): 25 mg via ORAL
  Filled 2019-08-21 (×3): qty 1

## 2019-08-21 MED ORDER — HYDROCODONE-ACETAMINOPHEN 5-325 MG PO TABS
1.0000 | ORAL_TABLET | Freq: Three times a day (TID) | ORAL | Status: AC | PRN
  Administered 2019-08-21: 1 via ORAL
  Filled 2019-08-21: qty 1

## 2019-08-21 MED ORDER — HYDROCODONE-ACETAMINOPHEN 5-325 MG PO TABS
1.0000 | ORAL_TABLET | Freq: Once | ORAL | Status: AC | PRN
  Administered 2019-08-21: 1 via ORAL
  Filled 2019-08-21: qty 1

## 2019-08-21 MED ORDER — SODIUM CHLORIDE 0.9 % IV BOLUS
1000.0000 mL | Freq: Once | INTRAVENOUS | Status: AC
  Administered 2019-08-21: 1000 mL via INTRAVENOUS

## 2019-08-21 NOTE — Progress Notes (Signed)
New Admission Note:  Arrival Method: Via stretcher from ED to 64m22 Mental Orientation: Alert & Oriented x4 Telemetry: CCMD verified. Box-19 Assessment: Completed Skin: Refer to flowsheet IV: Right External Jugular  Pain: 0/10 Tubes: Safety Measures: Safety Fall Prevention Plan discussed with patient. Admission: Completed 5 [place] Orientation: Patient has been orientated to the room, unit and the staff.  Orders have been reviewed and are being implemented. Will continue to monitor the patient. Call light has been placed within reach and bed alarm has been activated.   Vassie Moselle, RN  Phone Number: [phone]

## 2019-08-21 NOTE — Progress Notes (Signed)
Paged for pain.RN reports patient in tears from her pain. Treated with Narcan on admission,  but vitals are stable. She is on 2 L [place] for comfort. Considered NSAIDs, but patient also has AKI. Offered PRN tylenol, but refused.  -PRN once Hydrocodone 5 -325 mg

## 2019-08-21 NOTE — Discharge Summary (Addendum)
Name: [name] [name] MRN: [id number] DOB: [birth year] 64 y.o. PCP: [name], MD  Date of Admission: 08/20/2019 11:31 AM Date of Discharge: 08/23/19 Attending Physician: Oda Kilts, MD  Discharge Diagnosis: 1. Acute Kidney injury  2. Opioid overdose 3. Panniculitis   Discharge Medications: Allergies as of 08/23/2019       Reactions   Morphine Rash   Penicillins Itching, Nausea And Vomiting, Rash, Hives   Has patient had a PCN reaction causing immediate rash, facial/tongue/throat swelling, SOB or lightheadedness with hypotension: No Has patient had a PCN reaction causing severe rash involving mucus membranes or skin necrosis: No Has patient had a PCN reaction that required hospitalization: No Has patient had a PCN reaction occurring within the last 10 years: No If all of the above answers are "NO", then may proceed with Cephalosporin use. Has patient had a PCN reaction causing immediate rash, facial/tongue/throat swelling, SOB or lightheadedness with hypotension: No Has patient had a PCN reaction causing severe rash involving mucus membranes or skin necrosis: No Has patient had a PCN reaction that required hospitalization: No Has patient had a PCN reaction occurring within the last 10 years: No If all of the above answers are "NO", then may proceed with Cephalosporin use.   Morphine And Related Itching        Medication List     STOP taking these medications    hydrOXYzine 50 MG capsule Commonly known as: VISTARIL   oxyCODONE 5 MG immediate release tablet Commonly known as: Oxy IR/ROXICODONE       TAKE these medications    acetaminophen 325 MG tablet Commonly known as: TYLENOL Take 2 tablets (650 mg total) by mouth every 6 (six) hours as needed for mild pain (or Fever >/= 101).   allopurinol 100 MG tablet Commonly known as: ZYLOPRIM Take 100 mg by mouth 2 (two) times daily.   ALPRAZolam 1 MG tablet Commonly known as: XANAX Take 1 mg by  mouth 3 (three) times daily as needed for anxiety.   atenolol-chlorthalidone 50-25 MG tablet Commonly known as: TENORETIC Take 1 tablet by mouth daily.   atorvastatin 10 MG tablet Commonly known as: LIPITOR Take 10 mg by mouth daily.   doxycycline 100 MG tablet Commonly known as: VIBRA-TABS Take 1 tablet (100 mg total) by mouth 2 (two) times daily for 4 days.   DULoxetine 30 MG capsule Commonly known as: CYMBALTA Take 30 mg by mouth 2 (two) times daily.   furosemide 40 MG tablet Commonly known as: LASIX Take 40 mg by mouth.   gabapentin 600 MG tablet Commonly known as: NEURONTIN Take 600 mg by mouth 3 (three) times daily.   glipiZIDE 5 MG tablet Commonly known as: GLUCOTROL Take 1 tablet (5 mg total) by mouth daily before breakfast.   hydrOXYzine 50 MG tablet Commonly known as: ATARAX/VISTARIL Take 50 mg by mouth every 8 (eight) hours as needed for anxiety or itching.   ibuprofen 800 MG tablet Commonly known as: ADVIL Take 800 mg by mouth every 8 (eight) hours as needed for moderate pain.   mirtazapine 30 MG tablet Commonly known as: REMERON Take 30 mg by mouth daily.   nystatin powder Generic drug: nystatin Apply 1 application topically 3 (three) times daily.   oxyCODONE-acetaminophen 10-325 MG tablet Commonly known as: PERCOCET Take 1 tablet by mouth every 8 (eight) hours as needed for up to 5 days for pain.   pantoprazole 40 MG tablet Commonly known as: PROTONIX Take 40 mg by  mouth daily.   phentermine 37.5 MG tablet Commonly known as: ADIPEX-P Take 37.5 mg by mouth daily before breakfast.   pioglitazone 30 MG tablet Commonly known as: ACTOS Take 30 mg by mouth daily.   sitaGLIPtin 100 MG tablet Commonly known as: JANUVIA Take 100 mg by mouth daily.   terbinafine 250 MG tablet Commonly known as: LAMISIL Take 250 mg by mouth daily.   zolpidem 10 MG tablet Commonly known as: AMBIEN Take 10 mg by mouth at bedtime as needed for sleep.                Durable Medical Equipment  (From admission, onward)           Start     Ordered   08/23/19 1344  For home use only DME Walker  Once       Comments: Bariatric 4Wheeled RW  Question:  Patient needs a walker to treat with the following condition  Answer:  Deconditioned low back   08/23/19 1344              Discharge Care Instructions  (From admission, onward)           Start     Ordered   08/23/19 0000  Leave dressing on - Keep it clean, dry, and intact until clinic visit        08/23/19 1430           Disposition and follow-up:   [name] [name] was discharged from [facility] in Stable condition.  At the hospital follow up visit please address:  1.  Follow -up:  A. AKI -  Make sure patient's renal function is stable, repeat BMP  B. Opioid Overdose -review safe levels of medication use  C. Panniculitis - Prescribed 7d course of antibiotics. Ulcers to abdomen and L flank.  2.  Labs / imaging needed at time of follow-up: BMP  3.  Pending labs/ test needing follow-up: none  Follow-up Appointments:  1. PCP 08/31/19 2. Wound care 08/29/30  Hospital Course by problem list: 1. AKI on CKD - Patient presented with Cr of 3.89. Her baseline is apparently 1.6-1.9. This was presumed to be pre-renal due to downtime after opioid overdose. Improved progressively with IV fluids. Cr 1.37 at time of discharge.  2. Opioid Overdose - Patient presented to ED after being found down. AMS resolved with Narcan in the ED. On admission, opioids were titrated up to her home dose of Percocet 1-2 tablets q8hr prn. No further episodes since admission. She notes pain in her L hip and a fall onto that hip prior to admission. Xray without acute fracture. She has been using prn medication for chronic pain and per her report, this was an accidental overdose.  3. Panniculitis - Multiple pannus wounds noted with minimal purulent drainage. She has been following with  outpatient wound care for these. We prescribed her 7 days of doxycycline. Not significantly changed at time of discharge. Currently covered with bandages. Continue following with your wound care clinic.  4.  Limited mobility with falls: Evaluated by PT who recommended SNF.  However, she preferred to go home with services.  Discharge Vitals:   BP (!) 144/59 (BP Location: Right Wrist)   Pulse 96   Temp 97.9 F (36.6 C) (Oral)   Resp 18   Ht 5\' 2"  (1.575 m)   Wt (!) 149.7 kg   SpO2 92%   BMI 60.36 kg/m   Pertinent Labs, Studies, and Procedures:  CBC Latest Ref Rng & Units 08/21/2019 08/20/2019 08/20/2019  WBC 4.0 - 10.5 K/uL 6.6 - -  Hemoglobin 12.0 - 15.0 g/dL 9.8(L) 10.9(L) 10.9(L)  Hematocrit 36 - 46 % 32.7(L) 32.0(L) 32.0(L)  Platelets 150 - 400 K/uL 456(H) - -   CMP Latest Ref Rng & Units 08/23/2019 08/22/2019 08/21/2019  Glucose 70 - 99 mg/dL 93 112(H) 149(H)  BUN 8 - 23 mg/dL 31(H) 49(H) 58(H)  Creatinine 0.44 - 1.00 mg/dL 1.37(H) 2.35(H) 2.97(H)  Sodium 135 - 145 mmol/L 142 143 140  Potassium 3.5 - 5.1 mmol/L 4.3 4.6 5.1  Chloride 98 - 111 mmol/L 109 112(H) 108  CO2 22 - 32 mmol/L 23 22 20(L)  Calcium 8.9 - 10.3 mg/dL 9.4 9.0 8.9  Total Protein 6.5 - 8.1 g/dL - - -  Total Bilirubin 0.3 - 1.2 mg/dL - - -  Alkaline Phos 38 - 126 U/L - - -  AST 15 - 41 U/L - - -  ALT 0 - 44 U/L - - -     Discharge Instructions: 1. Complete doxycycline 100mg  BID, to end on July 6 2. Follow up with PCP on 08/31/19 3. Follow up with wound care on 08/29/19  Discharge Instructions     Call MD for:  extreme fatigue   Complete by: As directed    Call MD for:  persistant dizziness or light-headedness   Complete by: As directed    Diet - low sodium heart healthy   Complete by: As directed    Discharge instructions   Complete by: As directed    Ms. Vega, it was a pleasure taking care of you. Here are your discharge instructions.  1. Continue doxycycline 100mg  BID for 4 more days, ending on  08/27/19. 2. Use your Percocet as prescribed, every 8 hours as needed. 3. Follow up with your PCP. 4. Follow up with wound care   Increase activity slowly   Complete by: As directed    Leave dressing on - Keep it clean, dry, and intact until clinic visit   Complete by: As directed        Signed: Andrew Au, MD 08/23/2019, 2:31 PM   Pager: [phone]

## 2019-08-21 NOTE — Progress Notes (Signed)
  Date: 08/21/2019  Patient name: [name] [name]  Medical record number: [id number]  Date of birth: Aug 11, 1955   I have seen and evaluated [name] [name] and discussed their care with the Residency Team.  In brief, patient is a 64-year-old female with a past medical history of type 2 diabetes, hypertension, diabetic neuropathy, CKD stage IV, sleep apnea presented to the ED with altered mental status x1 day.  Patient states that she has had multiple falls over the last several days and has had worsening left knee and ankle pain since then.  Patient is on opiates at home for chronic pain control and has been taking more of her opiate pain medications secondary to the pain in her left knee and ankle.  Initially on presentation in the ED she was somnolent and was noted to have hypercarbic respiratory acidosis and was given Narcan with improvement in her acidosis as well as in her mental status.  Today patient states that she did not sleep well last night and she does have persistent left knee pain but her mental status appears to be at baseline.  PMHx, Fam Hx, and/or Soc Hx : As per resident admit note  Vitals:   08/21/19 0602 08/21/19 0943  BP: (!) 120/51 (!) 154/57  Pulse: 87 91  Resp: 20 18  Temp: 98.3 F (36.8 C) 98.4 F (36.9 C)  SpO2: 91% 95%   General: Awake, alert, oriented x3, NAD CVS: Regular rate and rhythm, normal heart sounds Lungs: CTA laterally Abdomen: Soft, nontender, obese, normoactive bowel sounds Extremities: No edema noted, mild tenderness to palpation over left knee and ankle HEENT: Normocephalic, atraumatic Psych: Normal mood and affect Skin: Patient with multiple wounds over her pannus with 1 having minimal purulent drainage  Assessment and Plan: I have seen and evaluated the patient as outlined above. I agree with the formulated Assessment and Plan as detailed in the residents' note, with the following changes:   1.  Altered mental status: -Patient  brought to the ED with altered mental status likely secondary to opiate medication overdose.  Patient had worsening left ankle knee pain after fall at home and has been taking increased doses of her opiate pain medications.  Patient's mental status appears to be at baseline now after receiving Narcan in the ED. -We will resume home pain medications today.  Patient will need to be cautioned on increasing the dose of her medications without consulting her physician first -Patient was also noted to have multiple wounds on her pannus 1 of which had purulent drainage.  Will continue with doxycycline 100 mg twice daily to complete a 5 to 7-day course for wound infection.  We will follow-up wound care consult -PT/OT follow-up and recommendations appreciated.  PT recommending SNF placement -Patient was also noted to have AKI on CKD stage IV on admission.  Patient baseline creatinine appears to be approximately 1.6-1.9.  Patient's creatinine admission here was 3.89 (AKI likely secondary to prerenal etiology).  Patient's creatinine has improved to 2.97 today with IV fluids.  Patient is also on Lasix and chlorthalidone at home which likely contributed to her AKI.  We will continue with gentle IV hydration for now -Monitor BMP closely.  Would consider repeating BMP in a.m. -No further work-up at this time.  Patient stable for DC to SNF once bed is available.  Aldine Contes, MD 6/30/20211:41 PM

## 2019-08-21 NOTE — Progress Notes (Signed)
Occupational Therapy Evaluation Patient Details Name: [name] [name] MRN: [id number] DOB: August 08, 1955 Today's Date: 08/21/2019    History of Present Illness [name] [name] is a 64 yo female with a pertinent PMH of CHF, type 2 diabetes, hypertension, diabetic neuropathy, CKD stage IV, sleep apnea who presented with a chief complaint of AMS.   Clinical Impression   Patient lives alone in a level entry [place].  She has an aid who assists her with showering/dressing.  Has recently been performing bed baths as her shower chair does not fit in shower.  Patient states she is usually able to walk with RW for in-home mobility.  Today patient having high levels of pain with any movement of L LE, particularly knee.  She was initially agreeable to get to EOB and began process with min assist moving LEs.  Sister arrived mid way and patient became more irritated and eventually denied further mobility.  Patient has decreased safety awareness and function and would not be safe returning home unless she can receive 24 assist.  Recommending SNF.  Will continue to follow with OT acutely to address the deficits listed below.      Follow Up Recommendations  SNF;Supervision/Assistance - 24 hour    Equipment Recommendations  Other (comment) (RW)    Recommendations for Other Services       Precautions / Restrictions Precautions Precautions: Fall Precaution Comments: Exquisite pain LLE (ankle and knee), and possible L foot gout Restrictions Weight Bearing Restrictions: No      Mobility Bed Mobility Overal bed mobility: Needs Assistance Bed Mobility: Supine to Sit;Sit to Supine     Supine to sit: +2 for physical assistance;Mod assist;Max assist Sit to supine: +2 for physical assistance;Mod assist;Max assist   General bed mobility comments: ATtempting to get to EOB.  Patient began moving legs with mod assist, though once sister arrived patient became more irritated.  Demanding back to supine.  [place]  requiring max x2 assist for EOB  Transfers                      Balance                                           ADL either performed or assessed with clinical judgement   ADL Overall ADL's : Needs assistance/impaired Eating/Feeding: Set up;Bed level   Grooming: Set up;Bed level   Upper Body Bathing: Moderate assistance;Bed level   Lower Body Bathing: Total assistance;Bed level   Upper Body Dressing : Moderate assistance;Bed level   Lower Body Dressing: Total assistance;Sit to/from stand   Toilet Transfer: Total assistance           Functional mobility during ADLs: Maximal assistance;+2 for physical assistance (bed mobility) General ADL Comments: Patient requiring a lot of encouragement to particpate. Ultimately refusing further therapy mid session due to pain.  Patient agitated and demanding back to bed     Vision         Perception     Praxis      Pertinent Vitals/Pain Pain Assessment: Faces Faces Pain Scale: Hurts whole lot Pain Location: LLE, knee and ankle; exquisite pain with attempts at ROM and mobility Pain Descriptors / Indicators: Crying;Grimacing;Guarding (yelling) Pain Intervention(s): Limited activity within patient's tolerance;Monitored during session;Repositioned     Hand Dominance     Extremity/Trunk Assessment Upper Extremity Assessment Upper Extremity Assessment:  RUE deficits/detail;LUE deficits/detail RUE Deficits / Details: 3/5 strength. ROM limited due to weight LUE Deficits / Details: 3/5 strength. ROM limited due to weight           Communication Communication Communication: No difficulties   Cognition Arousal/Alertness: Awake/alert Behavior During Therapy: Restless Overall Cognitive Status: No family/caregiver present to determine baseline cognitive functioning Area of Impairment: Attention;Problem solving;Following commands;Safety/judgement                   Current Attention Level:  Sustained (not able to attend past pain )   Following Commands: Follows one step commands with increased time;Follows multi-step commands inconsistently Safety/Judgement: Decreased awareness of safety   Problem Solving: Slow processing;Decreased initiation;Requires verbal cues General Comments: Noted change in mood when sister arrived. Patient much more angry and irritated, decreased participation.  Patient's given PLOF and home set up differed when initially telling therapist vs when telling PT   General Comments       Exercises     Shoulder Instructions      Home Living Family/patient expects to be discharged to:: Private residence Living Arrangements: Alone Available Help at Discharge: Family;Available PRN/intermittently Type of Home: Apartment Home Access: Ramped entrance     Home Layout: One level     Bathroom Shower/Tub: Occupational psychologist: Standard     Home Equipment: Hospital bed;Other (comment) (shower chair but it is too big for shower, RW but too small )          Prior Functioning/Environment Level of Independence: Needs assistance  Gait / Transfers Assistance Needed: Reports she is able to get out of bed and use RW around house.  Uses w/c for mobility outside of home. ADL's / Homemaking Assistance Needed: Has an aide who assists her with baths (sounds like bed baths recently), dressing   Comments: Will need more details/verification of home situation, available assist, and PLOF        OT Problem List: Decreased strength;Decreased range of motion;Decreased activity tolerance;Impaired balance (sitting and/or standing);Decreased cognition;Decreased safety awareness;Obesity;Pain      OT Treatment/Interventions: Self-care/ADL training;Therapeutic exercise;Energy conservation;DME and/or AE instruction;Therapeutic activities;Cognitive remediation/compensation;Patient/family education;Balance training    OT Goals(Current goals can be found in the  care plan section) Acute Rehab OT Goals Patient Stated Goal: Did not specifically state OT Goal Formulation: With patient Time For Goal Achievement: 09/04/19 Potential to Achieve Goals: Fair ADL Goals Pt Will Perform Grooming: with set-up;sitting Pt Will Perform Upper Body Dressing: with min assist;sitting Pt Will Transfer to Toilet: with min assist;ambulating Additional ADL Goal #1: Patient will go supine <> EOB with min assist to prepare for seated ADLs. Additional ADL Goal #2: Patient will independently verbalize 3 fall prevention strategies.  OT Frequency: Min 2X/week   Barriers to D/C:            Co-evaluation              AM-PAC OT "6 Clicks" Daily Activity     Outcome Measure Help from another person eating meals?: A Little Help from another person taking care of personal grooming?: A Little Help from another person toileting, which includes using toliet, bedpan, or urinal?: Total Help from another person bathing (including washing, rinsing, drying)?: Total Help from another person to put on and taking off regular upper body clothing?: A Lot Help from another person to put on and taking off regular lower body clothing?: Total 6 Click Score: 11   End of Session Nurse Communication: Mobility status  Activity Tolerance: Patient limited by pain;Treatment limited secondary to agitation Patient left: in bed;with bed alarm set;with call bell/phone within reach  OT Visit Diagnosis: Unsteadiness on feet (R26.81);Repeated falls (R29.6);History of falling (Z91.81);Muscle weakness (generalized) (M62.81);Pain;Other symptoms and signs involving cognitive function Pain - Right/Left: Left Pain - part of body: Hip;Knee;Ankle and joints of foot                Time: 1103-1594 OT Time Calculation (min): 29 min Charges:  OT General Charges $OT Visit: 1 Visit OT Evaluation $OT Eval Moderate Complexity: 1 Mod OT Treatments $Therapeutic Activity: 8-22 mins  August Luz,  OTR/L   Phylliss Bob 08/21/2019, 1:49 PM

## 2019-08-21 NOTE — Progress Notes (Signed)
° °  Subjective:   Patient seen and evaluated at bedside this morning. She states that she did not sleep well last night. She states that she did not sleep well last night. She denies headaches, chest pain, shortness of breath. She does endorse knee pain, which has been present since Sunday.    Objective:  Vital signs in last 24 hours: Vitals:   08/20/19 2235 08/21/19 0239 08/21/19 0602 08/21/19 0943  BP: (!) 124/52 (!) 143/58 (!) 120/51 (!) 154/57  Pulse: 79 87 87 91  Resp: 20 20 20 18   Temp: 98.2 F (36.8 C) 98.2 F (36.8 C) 98.3 F (36.8 C) 98.4 F (36.9 C)  TempSrc:    Oral  SpO2: 100% 100% 91% 95%  Weight:      Height:       Physical Exam Constitutional:      Appearance: She is obese.  HENT:     Head: Normocephalic and atraumatic.  Eyes:     Extraocular Movements: Extraocular movements intact.  Cardiovascular:     Rate and Rhythm: Normal rate.     Pulses: Normal pulses.     Heart sounds: Normal heart sounds.  Pulmonary:     Effort: Pulmonary effort is normal. No respiratory distress.     Comments: breath sounds limited by body habitus  Abdominal:     General: Bowel sounds are normal.     Palpations: Abdomen is soft.     Tenderness: There is no abdominal tenderness.  Musculoskeletal:        General: Tenderness (left knee and ankle) present. Normal range of motion.     Cervical back: Normal range of motion.     Right lower leg: No edema.     Left lower leg: No edema.  Skin:    General: Skin is warm and dry.     Findings: Wound (4 pannus wounds with minimal purulent drainage) present.  Neurological:     Mental Status: She is alert and oriented to person, place, and time. Mental status is at baseline.  Psychiatric:        Mood and Affect: Mood normal.    Assessment/Plan:  Principal Problem:   AKI (acute kidney injury) ([place]) Active Problems:   Opiate overdose ([place])   Multiple wounds   [name] [name] is a 64 y.o. with pertinent PMH of CHF,type  2diabetes, hypertension, diabetic neuropathy,CKD stage IV,sleep apnea who presented with AMS and admit for AKI on hospital day 0  #Opioid Overdose #Respioratory acidosis - Will restart home pain medications today.   #OSA/OHA: - CPAP overnight.  #Panniculitis: Patient has pannus wounds with purulent discharge. - Start Doxycycline 100 mg  - Wound care consult  #AKI Renal function is improving, continue gentle hydration. - Repeat BMP today.  #CHF: - Continue home medications   Diet: HH VTE: Lovenox Code: Full Dipso: SNF when she gets placement.   Marianna Payment, D.O. [facility], PGY-2 Pager: [phone], Phone: [phone] Date 08/21/2019 Time 12:57 PM

## 2019-08-21 NOTE — Consult Note (Signed)
[facility] Nurse wound consult note Consultation was completed by review of records, images and assistance from the bedside nurse/clinical staff.  Patient followed in the wound care center; last see 08/01/19. Followed as far back as far as 06/2017.  Reason for Consult: pannus and flank wound Chronic wounds seen last by [facility] team in Aug. 20 Wound type: unknown etiology; patient self reported last admission they just came up.  Pressure Injury POA: NA Measurement: see nursing flow sheet Wound bed: pink, chronic appearance  Drainage (amount, consistency, odor) dried serous, reported to be purulent Periwound: Intact  Dressing procedure/placement/frequency Silver hydrofiber to open wounds; pack any tunneled areas. Top with ABD pad or foam based on drainage.   Follow up in the wound care center at [place].  Discussed POC with patient and bedside nurse.  Re consult if needed, will not follow at this time. Thanks  Hollee Fate [employer], RN,CWOCN, CNS, [place] [phone])

## 2019-08-21 NOTE — Evaluation (Addendum)
Physical Therapy Evaluation Patient Details Name: [name] [name] MRN: [id number] DOB: 10/15/1955 Today's Date: 08/21/2019   History of Present Illness  Shametra Cumberland is a 64 yo female with a pertinent PMH of CHF, type 2 diabetes, hypertension, diabetic neuropathy, CKD stage IV, sleep apnea who presented with a chief complaint of AMS.  Clinical Impression   Pt admitted with above diagnosis. Pt comes from home where she lives alone, has an Aide M-F, and brother and sister assist when they can; Single level apartment with ramped entrance; uses hospital bed; Per pt interview and chart revies, uses bariatric 4 wheeled RW  [phone]) at baseline, and her current 4WW is non-functional; as of last summer, she was abl eto walk short distances wth 4WW; Presents to PT with significant pain LLE (knee, ankle and foot), obesity/incr body habitus, which is significantly impairing functional mobility and ADLs; Unable to get to fully seated EOB with +2 phsyical assist; At this point, we must consider SNF for post-acute rehab to maximize independence and safety with mobility/ADLs, and allow for safe DC home; Pt currently with functional limitations due to the deficits listed below (see PT Problem List). Pt will benefit from skilled PT to increase their independence and safety with mobility to allow discharge to the venue listed below.     Did not have the opportunity to discuss need for post-acute rehab with Ms. Lizama; It is possible that she may refuse SNF, in which case, she will need HHPT/OT/Aide (currently receiving HHPT and Aide services), and a Bariatric 4 Wheeled RW    Follow Up Recommendations SNF    Equipment Recommendations  Other (comment) (Bariatric 4Wheeled RW)    Recommendations for Other Services       Precautions / Restrictions Precautions Precautions: Fall Precaution Comments: Exquisite pain LLE (ankle and knee), and possible L foot gout Restrictions Weight Bearing Restrictions: No       Mobility  Bed Mobility Overal bed mobility: Needs Assistance Bed Mobility: Supine to Sit;Sit to Supine     Supine to sit: +2 for physical assistance;Mod assist;Max assist Sit to supine: +2 for physical assistance;Mod assist;Max assist   General bed mobility comments: Initially participating and using rails to pull/push while scooting and angling body towards EOB in prep for getting up; Notably decr participation once her sister arrived; Made moves towards getting feet to the floor and elevating trunk to sit with 2 person Max assist, and focused support fo rLLE coming off the bed, however pt unable to sit up; REqusted therapists "back up" at one point, then accepting help getting LLE back into bed; Slid to EOB with bed in trendelenburg, and pt able to hole head board to assist in pulling up  Transfers                    Ambulation/Gait                Stairs            Wheelchair Mobility    Modified Rankin (Stroke Patients Only)       Balance                                             Pertinent Vitals/Pain Pain Assessment: Faces Faces Pain Scale: Hurts whole lot (Near tears) Pain Location: LLE, knee and ankle; exquisite pain with attempts at ROM and  mobility Pain Descriptors / Indicators: Crying;Grimacing;Guarding (Yelling) Pain Intervention(s): Other (comment);Repositioned;Monitored during session (Notified RN)    Home Living Family/patient expects to be discharged to:: Private residence Living Arrangements: Alone Available Help at Discharge: Family;Available PRN/intermittently Type of Home: Apartment Home Access: Ramped entrance     Home Layout: One level Home Equipment: Hospital bed (Her RW is no longer functional)      Prior Function Level of Independence: Needs assistance   Gait / Transfers Assistance Needed: Reports she gets OOB daily, and has her brother's assist when he is able to help her (which sounds like it's hit  or miss); Notable that as of last acute PT visit late summer of 2020, she was able to walk to the bathroom with family assist  ADL's / Homemaking Assistance Needed: Has an aide who assists her with baths (sounds like bed baths recently), dressing  Comments: Will need more details/verification of home situation, available assist, and PLOF     Hand Dominance        Extremity/Trunk Assessment   Upper Extremity Assessment Upper Extremity Assessment: Defer to OT evaluation    Lower Extremity Assessment Lower Extremity Assessment: Generalized weakness;RLE deficits/detail;LLE deficits/detail;Difficult to assess due to impaired cognition (Decr participation with anticipation of pain) RLE Deficits / Details: Grossly decr AROM, limited by body habitus; able to lift RLE back onto bed slowly LLE Deficits / Details: Exquisite pain throughout LLE, esp knee and ankle pain, with attemtps at ROM and mobility LLE: Unable to fully assess due to pain    Cervical / Trunk Assessment Cervical / Trunk Assessment: Other exceptions Cervical / Trunk Exceptions: Limited trunk ROM by body habitus  Communication   Communication: No difficulties (though fast speech, and occasionally difficult to understand)  Cognition Arousal/Alertness: Awake/alert Behavior During Therapy: Restless Overall Cognitive Status: Impaired/Different from baseline Area of Impairment: Attention;Problem solving                   Current Attention Level:  (Extremely distracted by pain, and the anticipation of pain)         Problem Solving: Decreased initiation;Requires verbal cues General Comments: Notable that once her sister arrived, there was a shift in attitude and participation      General Comments General comments (skin integrity, edema, etc.): Noting some discrepancies in her report of home situation and available assist; will need verification    Exercises     Assessment/Plan    PT Assessment Patient needs  continued PT services  PT Problem List Decreased strength;Decreased range of motion;Decreased activity tolerance;Decreased balance;Decreased mobility;Decreased coordination;Decreased cognition;Decreased knowledge of use of DME;Decreased safety awareness;Decreased knowledge of precautions;Pain;Obesity;Decreased skin integrity       PT Treatment Interventions DME instruction;Gait training;Functional mobility training;Therapeutic activities;Therapeutic exercise;Balance training;Patient/family education    PT Goals (Current goals can be found in the Care Plan section)  Acute Rehab PT Goals Patient Stated Goal: Did not specifically state PT Goal Formulation: Patient unable to participate in goal setting Time For Goal Achievement: 09/04/19 Potential to Achieve Goals: Fair    Frequency Min 2X/week   Barriers to discharge Decreased caregiver support Need to verify how much assist is available to Ms. Mandrell    Co-evaluation               AM-PAC PT "6 Clicks" Mobility  Outcome Measure Help needed turning from your back to your side while in a flat bed without using bedrails?: A Lot Help needed moving from lying on your back to sitting on  the side of a flat bed without using bedrails?: A Lot Help needed moving to and from a bed to a chair (including a wheelchair)?: Total Help needed standing up from a chair using your arms (e.g., wheelchair or bedside chair)?: Total Help needed to walk in hospital room?: Total Help needed climbing 3-5 steps with a railing? : Total 6 Click Score: 8    End of Session Equipment Utilized During Treatment: Other (comment) (bed pad) Activity Tolerance: Patient limited by pain Patient left: in bed;with call bell/phone within reach;with bed alarm set Nurse Communication: Mobility status PT Visit Diagnosis: Other abnormalities of gait and mobility (R26.89);Repeated falls (R29.6);Muscle weakness (generalized) (M62.81);Pain Pain - Right/Left: Left Pain - part  of body: Leg    Time: 1610-9604 PT Time Calculation (min) (ACUTE ONLY): 22 min   Charges:   PT Evaluation $PT Eval Moderate Complexity: 1 Mod          Roney Marion, PT  Acute Rehabilitation Services Pager [phone] Office [phone]   Colletta [place] 08/21/2019, 9:53 AM

## 2019-08-21 NOTE — Progress Notes (Signed)
PHARMACY - PHYSICIAN COMMUNICATION CRITICAL VALUE ALERT - BLOOD CULTURE IDENTIFICATION (BCID)  [name] [name] is an 64 y.o. female who presented to [facility] on 08/20/[facility] with a chief complaint of altered mental status  Assessment:  1 blood culture positive for GPCs, patient with no fever and no elevated WBCs - likely a contaminant  Name of physician (or Provider) Contacted: Dr. Marva Panda  Current antibiotics: Doxycycline 100 mg po bid for pannus wound infections  Changes to prescribed antibiotics recommended:  No change to antibiotics needed  Results for orders placed or performed during the hospital encounter of 08/20/19  Blood Culture ID Panel (Reflexed) (Collected: 08/20/2019 12:35 PM)  Result Value Ref Range   Enterococcus species NOT DETECTED NOT DETECTED   Listeria monocytogenes NOT DETECTED NOT DETECTED   Staphylococcus species NOT DETECTED NOT DETECTED   Staphylococcus aureus (BCID) NOT DETECTED NOT DETECTED   Streptococcus species NOT DETECTED NOT DETECTED   Streptococcus agalactiae NOT DETECTED NOT DETECTED   Streptococcus pneumoniae NOT DETECTED NOT DETECTED   Streptococcus pyogenes NOT DETECTED NOT DETECTED   Acinetobacter baumannii NOT DETECTED NOT DETECTED   Enterobacteriaceae species NOT DETECTED NOT DETECTED   Enterobacter cloacae complex NOT DETECTED NOT DETECTED   Escherichia coli NOT DETECTED NOT DETECTED   Klebsiella oxytoca NOT DETECTED NOT DETECTED   Klebsiella pneumoniae NOT DETECTED NOT DETECTED   Proteus species NOT DETECTED NOT DETECTED   Serratia marcescens NOT DETECTED NOT DETECTED   Haemophilus influenzae NOT DETECTED NOT DETECTED   Neisseria meningitidis NOT DETECTED NOT DETECTED   Pseudomonas aeruginosa NOT DETECTED NOT DETECTED   Candida albicans NOT DETECTED NOT DETECTED   Candida glabrata NOT DETECTED NOT DETECTED   Candida krusei NOT DETECTED NOT DETECTED   Candida parapsilosis NOT DETECTED NOT DETECTED   Candida tropicalis NOT DETECTED NOT  DETECTED    [name] 08/21/2019  6:50 PM

## 2019-08-22 DIAGNOSIS — Y929 Unspecified place or not applicable: Secondary | ICD-10-CM | POA: Diagnosis not present

## 2019-08-22 DIAGNOSIS — R296 Repeated falls: Secondary | ICD-10-CM | POA: Diagnosis present

## 2019-08-22 DIAGNOSIS — Z6841 Body Mass Index (BMI) 40.0 and over, adult: Secondary | ICD-10-CM | POA: Diagnosis not present

## 2019-08-22 DIAGNOSIS — E785 Hyperlipidemia, unspecified: Secondary | ICD-10-CM | POA: Diagnosis present

## 2019-08-22 DIAGNOSIS — G4730 Sleep apnea, unspecified: Secondary | ICD-10-CM | POA: Diagnosis present

## 2019-08-22 DIAGNOSIS — E872 Acidosis: Secondary | ICD-10-CM | POA: Diagnosis present

## 2019-08-22 DIAGNOSIS — E875 Hyperkalemia: Secondary | ICD-10-CM | POA: Diagnosis present

## 2019-08-22 DIAGNOSIS — F329 Major depressive disorder, single episode, unspecified: Secondary | ICD-10-CM | POA: Diagnosis present

## 2019-08-22 DIAGNOSIS — T402X1A Poisoning by other opioids, accidental (unintentional), initial encounter: Secondary | ICD-10-CM | POA: Diagnosis present

## 2019-08-22 DIAGNOSIS — N184 Chronic kidney disease, stage 4 (severe): Secondary | ICD-10-CM | POA: Diagnosis present

## 2019-08-22 DIAGNOSIS — G4733 Obstructive sleep apnea (adult) (pediatric): Secondary | ICD-10-CM | POA: Diagnosis present

## 2019-08-22 DIAGNOSIS — F419 Anxiety disorder, unspecified: Secondary | ICD-10-CM | POA: Diagnosis present

## 2019-08-22 DIAGNOSIS — G8929 Other chronic pain: Secondary | ICD-10-CM | POA: Diagnosis present

## 2019-08-22 DIAGNOSIS — Z79891 Long term (current) use of opiate analgesic: Secondary | ICD-10-CM | POA: Diagnosis not present

## 2019-08-22 DIAGNOSIS — Z9181 History of falling: Secondary | ICD-10-CM | POA: Diagnosis not present

## 2019-08-22 DIAGNOSIS — I130 Hypertensive heart and chronic kidney disease with heart failure and stage 1 through stage 4 chronic kidney disease, or unspecified chronic kidney disease: Secondary | ICD-10-CM | POA: Diagnosis present

## 2019-08-22 DIAGNOSIS — M793 Panniculitis, unspecified: Secondary | ICD-10-CM | POA: Diagnosis present

## 2019-08-22 DIAGNOSIS — E1122 Type 2 diabetes mellitus with diabetic chronic kidney disease: Secondary | ICD-10-CM | POA: Diagnosis present

## 2019-08-22 DIAGNOSIS — Z833 Family history of diabetes mellitus: Secondary | ICD-10-CM | POA: Diagnosis not present

## 2019-08-22 DIAGNOSIS — N179 Acute kidney failure, unspecified: Secondary | ICD-10-CM | POA: Diagnosis present

## 2019-08-22 DIAGNOSIS — R0902 Hypoxemia: Secondary | ICD-10-CM | POA: Diagnosis present

## 2019-08-22 DIAGNOSIS — Z20822 Contact with and (suspected) exposure to covid-19: Secondary | ICD-10-CM | POA: Diagnosis present

## 2019-08-22 DIAGNOSIS — E1140 Type 2 diabetes mellitus with diabetic neuropathy, unspecified: Secondary | ICD-10-CM | POA: Diagnosis present

## 2019-08-22 DIAGNOSIS — I509 Heart failure, unspecified: Secondary | ICD-10-CM | POA: Diagnosis present

## 2019-08-22 LAB — BASIC METABOLIC PANEL
Anion gap: 9 (ref 5–15)
BUN: 49 mg/dL — ABNORMAL HIGH (ref 8–23)
CO2: 22 mmol/L (ref 22–32)
Calcium: 9 mg/dL (ref 8.9–10.3)
Chloride: 112 mmol/L — ABNORMAL HIGH (ref 98–111)
Creatinine, Ser: 2.35 mg/dL — ABNORMAL HIGH (ref 0.44–1.00)
GFR calc Af Amer: 24 mL/min — ABNORMAL LOW (ref >60)
GFR calc non Af Amer: 21 mL/min — ABNORMAL LOW (ref >60)
Glucose, Bld: 112 mg/dL — ABNORMAL HIGH (ref 70–99)
Potassium: 4.6 mmol/L (ref 3.5–5.1)
Sodium: 143 mmol/L (ref 135–145)

## 2019-08-22 MED ORDER — LACTATED RINGERS IV SOLN: INTRAVENOUS | Status: AC

## 2019-08-22 MED ORDER — OXYCODONE-ACETAMINOPHEN 5-325 MG PO TABS
1.0000 | ORAL_TABLET | Freq: Three times a day (TID) | ORAL | Status: DC | PRN
  Administered 2019-08-23 (×2): 2 via ORAL
  Filled 2019-08-22 (×2): qty 2

## 2019-08-22 MED ORDER — OXYCODONE-ACETAMINOPHEN 5-325 MG PO TABS: 1.0000 | ORAL_TABLET | Freq: Three times a day (TID) | ORAL | Status: DC | PRN

## 2019-08-22 NOTE — Progress Notes (Addendum)
   Subjective: Patient evaluated at bedside. She is awake and alert. Reports mild pain at this time in her R hip and overlying skin. At home is prescribed Percocet TID prn. Last pain medication here at 0300 yesterday morning.   Objective:  Vital signs in last 24 hours: Vitals:   08/21/19 1709 08/21/19 2130 08/22/19 0518 08/22/19 1019  BP: (!) 138/55 (!) 116/56 (!) 147/59 (!) 141/57  Pulse: 95 92 92 96  Resp: 18 19 19 18   Temp: 98.8 F (37.1 C) 98.2 F (36.8 C) 98.2 F (36.8 C) 98.2 F (36.8 C)  TempSrc: Oral   Oral  SpO2: 91% 93% 97% 96%  Weight:      Height:        Physical Exam Constitutional: no acute distress Head: atraumatic ENT: external ears normal Eyes: EOMI Cardiovascular: regular rate and rhythm, normal heart sounds Pulmonary: effort normal, normal breath sounds bilaterally Abdominal: obese, nontender, no rebound tenderness, bowel sounds normal Skin: warm and dry. Bandages over wounds to R lower abdomen and L flank. Significant panus. Neurological: alert, no focal deficit Psychiatric: normal mood and affect  Assessment/Plan: Ms. Meador is a 64 y/o F with Hx of DM, HTN, diabetic neuropathy, CKD 4, OSA, and CHF presenting for opioid overdose and found to have an AKI. AMS resolved with narcan in the ED. AKI is improving with hydration. She is prescribed opioids at home for chronic pain.  Principal Problem:   AKI (acute kidney injury) ([place]) Active Problems:   Opiate overdose ([place])   Multiple wounds  AKI on CKD stage IV Admission Cr of 3.89. Baseline appears to be 1.6-1.9. Presumed to be pre-renal 2/2 volume down status due to opioid overdose. Improving with IVF, 2.35 today.  Plan: -continue LR 12ml/hr, will finish tonight to avoid volume overload -monitor Cr  Chronic opioid use Admitted with opioid overdose, AMS resolved with Norco in the ED. Prescribed Percocet TID prn at baseline. Pain medication restarted yesterday, last Precocet received at 0300 on  6/30. Plan: -restart home meds: Percocet q8 prn -will monitor carefully for recurrence of AMS  Panniculitis Pannus wounds with purulent discharge. 1 vial of blood culture with GPC, presumed to be contaminant. Per wound care consult, is followed in wound clinic. Plan: -continue doxycycline 100mg  to 7 days, currently day 3 -outpatient wound care f/u  OSA -CPAP overnight   Diet: heart healthy carb modified diet IVF: LR at 176ml/hr VTE: Lovenox Prior to Admission Living Arrangement: home Anticipated Discharge Location: home Barriers to Discharge: [place] approval for SNF. Otherwise stable for discharge Dispo: Anticipated discharge in approximately 1 day(s).   Andrew Au, MD 08/22/2019, 2:32 PM Pager: [phone] After 5pm on weekdays and 1pm on weekends: On Call pager [phone]

## 2019-08-23 LAB — BASIC METABOLIC PANEL
Anion gap: 10 (ref 5–15)
BUN: 31 mg/dL — ABNORMAL HIGH (ref 8–23)
CO2: 23 mmol/L (ref 22–32)
Calcium: 9.4 mg/dL (ref 8.9–10.3)
Chloride: 109 mmol/L (ref 98–111)
Creatinine, Ser: 1.37 mg/dL — ABNORMAL HIGH (ref 0.44–1.00)
GFR calc Af Amer: 46 mL/min — ABNORMAL LOW (ref >60)
GFR calc non Af Amer: 40 mL/min — ABNORMAL LOW (ref >60)
Glucose, Bld: 93 mg/dL (ref 70–99)
Potassium: 4.3 mmol/L (ref 3.5–5.1)
Sodium: 142 mmol/L (ref 135–145)

## 2019-08-23 MED ORDER — DOXYCYCLINE HYCLATE 100 MG PO TABS: 100.0000 mg | ORAL_TABLET | Freq: Two times a day (BID) | ORAL | 0 refills | Status: AC

## 2019-08-23 MED ORDER — OXYCODONE-ACETAMINOPHEN 10-325 MG PO TABS: 1.0000 | ORAL_TABLET | Freq: Three times a day (TID) | ORAL | 0 refills | Status: AC | PRN

## 2019-08-23 MED ORDER — ALPRAZOLAM 1 MG PO TABS: 1.0000 mg | ORAL_TABLET | Freq: Three times a day (TID) | ORAL | 0 refills | Status: DC | PRN

## 2019-08-23 NOTE — Plan of Care (Signed)

## 2019-08-23 NOTE — Progress Notes (Signed)
   08/23/19 1608  AVS Discharge Documentation  AVS Discharge Instructions Including Medications Provided to patient/caregiver  Name of Person Receiving AVS Discharge Instructions Including Medications Christain [place]  Name of Clinician That Reviewed AVS Discharge Instructions Including Medications Reynaldo Minium, RN

## 2019-08-23 NOTE — TOC Transition Note (Signed)
Transition of Care [facility]) - CM/SW Discharge Note   Patient Details  Name: [name] [name] MRN: [id number] Date of Birth: 05-02-1955  Transition of [facility]) CM/SW Contact:  Bartholomew Crews, RN Phone Number: 08/23/2019, 5:02 PM   Clinical Narrative:     Spoke with patient at the bedside to discuss transition plans. PTA home alone with home care 3.5 hours a day/5 days a week through SNL. Patient reports that she has a [place] RN once a week for wound care to her legs, and PT twice a week, but unable to verify which [facility] agency. Patient stating SNL, and [facility] able to identify that she has [place] and stating that SNL is just home care. HH orders received and sent to [facility] to assist with ongoing [place] services for RN, PT, OT.   Discussed DME at home. Has hospital bed that she has had about 1.5 years, however, it is too small. Spoke with Burman Nieves, SW at [place], who confirmed standard hospital bed January 2020 for patient at 270#, however, patient is now 330#. Maggie to request PCP follow up appt with Dr. Jonnie Kind at [facility] to follow up with changes in ongoing care.   DME bariatric rollator ordered. Referral sent to [facility], and delivery to room. Patient's sister to pick up her things and rollator. Patient requesting [facility] transport stating that she has her keys to get into home, and her granddaughter is at her home.   DC summary and [place] orders faxed to [phone].   No further TOC needs identified.    Final next level of care: [place] Barriers to Discharge: No Barriers Identified   Patient Goals and CMS Choice Patient states their goals for this hospitalization and ongoing recovery are:: return home CMS Medicare.gov Compare Post Acute Care list provided to:: Patient Choice offered to / list presented to : Patient  Discharge Placement                       Discharge Plan and Services In-house Referral: NA Discharge Planning Services: CM Consult Post Acute Care Choice: Home Health,  Durable Medical Equipment          DME Arranged: Walker rolling with seat DME Agency: [facility] Date DME Agency Contacted: 08/23/19 Time DME Agency Contacted: 1600 Representative spoke with at DME Agency: [place]: RN, PT, OT          Social Determinants of Health ([place]) Interventions     Readmission Risk Interventions No flowsheet data found.

## 2019-08-23 NOTE — Progress Notes (Signed)
Spoke with patient and sister in patient room.  Patient prefers discharge to home rather than to SNF. She has reasonable resources at home including home health aid 5 days a week, delivered meals, home PT twice a week, and a walker for getting around the house. She notes recent damage to her walker. She denies any difficulty with home tasks given these support systems. We will order a walker for her and discharge to home.

## 2019-08-23 NOTE — Plan of Care (Signed)
  Problem: Education: Goal: Knowledge of General Education information will improve Description: Including pain rating scale, medication(s)/side effects and non-pharmacologic comfort measures Outcome: Progressing   Problem: Coping: Goal: Level of anxiety will decrease Outcome: Progressing   

## 2019-08-23 NOTE — Plan of Care (Signed)
  Problem: Education: Goal: Knowledge of General Education information will improve Description: Including pain rating scale, medication(s)/side effects and non-pharmacologic comfort measures Outcome: Completed/Met   Problem: Health Behavior/Discharge Planning: Goal: Ability to manage health-related needs will improve Outcome: Completed/Met   Problem: Clinical Measurements: Goal: Ability to maintain clinical measurements within normal limits will improve Outcome: Completed/Met   

## 2019-08-23 NOTE — Progress Notes (Signed)
° °  Subjective: Patient is examined at bedside today. Patient still has pain on left hip, similar to yesterday. Denies fever, chill, chest pain, SOB, abd pain.   Had discussion about her disposition. We recommend SNF, but patient has some social concerns and is considering going home. She will think on it, and we will return to discuss with her later.  Objective:  Vital signs in last 24 hours: Vitals:   08/22/19 1019 08/22/19 1721 08/22/19 2123 08/23/19 0604  BP: (!) 141/57 (!) 146/69 (!) 152/60 (!) 141/61  Pulse: 96 91 95 95  Resp: 18 20 20 20   Temp: 98.2 F (36.8 C) 98.2 F (36.8 C) 98.4 F (36.9 C) 98 F (36.7 C)  TempSrc: Oral Oral Oral Oral  SpO2: 96% 96% 94% 94%  Weight:      Height:        Physical Exam Constitutional: no acute distress Head: atraumatic ENT: external ears normal Eyes: EOMI Cardiovascular: regular rate and rhythm, heart sounds faint due to habitus Pulmonary: effort normal, normal breath sounds bilaterally Abdominal: obese, flat, nontender, no rebound tenderness, bowel sounds normal Skin: warm and dry, bandages over ulcers to abdomen and L flank Neurological: alert, no focal deficit Psychiatric: normal mood and affect  Assessment/Plan: [name] [name] is a 64 y.o. female with Hx of DM, HTN, diabetic neuropathy, CKD 4, OSA, and CHF presenting for opioid overdose and found to have an AKI. AMS resolved with narcan in the ED. AKI is improving with hydration. She is prescribed opioids at home for chronic pain.  Principal Problem:   AKI (acute kidney injury) ([place]) Active Problems:   CKD (chronic kidney disease) stage 4, GFR 15-29 ml/min (HCC)   Pressure injury of skin   Opiate overdose ([place])   Multiple wounds   Morbid obesity ([place])  AKI on CKD stage IV Admission Cr of 3.89. Baseline appears to be 1.6-1.9. Presumed to be pre-renal 2/2 volume down status due to opioid overdose. Improved with IVF. Cr 1.37 today.  Plan: -LR completed -encourage PO  intake -monitor Cr  Chronic opioid use Admitted with opioid overdose, AMS resolved with Norco in the ED. Prescribed Percocet TID prn at baseline. Pain medication restarted yesterday, last received at 1100 today. Plan: -continue home meds: Percocet q8 prn -monitor  Panniculitis Pannus wounds with purulent discharge. 1 vial of blood culture with GPC, presumed to be contaminant. Per wound care consult, is followed in wound clinic. Plan: -continue doxycycline 100mg  to 7 days, currently day 4 -outpatient wound care f/u  OSA -CPAP overnight   Diet: heart healthy carb modified diet IVF: LR at 119ml/hr VTE: Lovenox Prior to Admission Living Arrangement: home Anticipated Discharge Location: SNF Barriers to Discharge: [place] approval for SNF.  Dispo: Stable for dispo when SNF bed available.   Andrew Au, MD 08/23/2019, 6:40 AM Pager: [phone] After 5pm on weekdays and 1pm on weekends: On Call pager [phone]

## 2019-08-24 LAB — CULTURE, BLOOD (ROUTINE X 2): Special Requests: ADEQUATE

## 2019-08-24 NOTE — Progress Notes (Signed)
DISCHARGE NOTE   [name] [name] to be discharged Home per MD order. Patient verbalized understanding.  Skin clean, dry and intact without evidence of skin break down, no evidence of skin tears noted. IV catheter discontinued intact. Site without signs and symptoms of complications. Dressing and pressure applied. Pt denies pain at the site currently. No complaints noted.  Patient free of lines, drains, and wounds.   Discharge packet assembled. An After Visit Summary (AVS) was printed and given to the patient/ EMS personnel. Patient escorted via stretcher and discharged to home via [place].     Babs Sciara, RN

## 2019-08-25 LAB — CULTURE, BLOOD (ROUTINE X 2)
Culture: NO GROWTH
Special Requests: ADEQUATE

## 2019-08-29 ENCOUNTER — Encounter (HOSPITAL_BASED_OUTPATIENT_CLINIC_OR_DEPARTMENT_OTHER): Payer: No Typology Code available for payment source | Admitting: Internal Medicine

## 2019-09-06 ENCOUNTER — Encounter (HOSPITAL_BASED_OUTPATIENT_CLINIC_OR_DEPARTMENT_OTHER): Payer: No Typology Code available for payment source | Admitting: Internal Medicine

## 2019-09-10 ENCOUNTER — Encounter (HOSPITAL_BASED_OUTPATIENT_CLINIC_OR_DEPARTMENT_OTHER): Payer: No Typology Code available for payment source | Admitting: Internal Medicine

## 2019-09-26 ENCOUNTER — Encounter (HOSPITAL_BASED_OUTPATIENT_CLINIC_OR_DEPARTMENT_OTHER): Payer: Medicare Other | Attending: Internal Medicine | Admitting: Physician Assistant

## 2019-09-26 DIAGNOSIS — M109 Gout, unspecified: Secondary | ICD-10-CM | POA: Insufficient documentation

## 2019-09-26 DIAGNOSIS — E1136 Type 2 diabetes mellitus with diabetic cataract: Secondary | ICD-10-CM | POA: Diagnosis not present

## 2019-09-26 DIAGNOSIS — E1140 Type 2 diabetes mellitus with diabetic neuropathy, unspecified: Secondary | ICD-10-CM | POA: Diagnosis not present

## 2019-09-26 DIAGNOSIS — E1151 Type 2 diabetes mellitus with diabetic peripheral angiopathy without gangrene: Secondary | ICD-10-CM | POA: Insufficient documentation

## 2019-09-26 DIAGNOSIS — I890 Lymphedema, not elsewhere classified: Secondary | ICD-10-CM | POA: Insufficient documentation

## 2019-09-26 DIAGNOSIS — Z6841 Body Mass Index (BMI) 40.0 and over, adult: Secondary | ICD-10-CM | POA: Diagnosis not present

## 2019-09-26 DIAGNOSIS — B354 Tinea corporis: Secondary | ICD-10-CM | POA: Diagnosis not present

## 2019-09-26 DIAGNOSIS — S31104D Unspecified open wound of abdominal wall, left lower quadrant without penetration into peritoneal cavity, subsequent encounter: Secondary | ICD-10-CM | POA: Diagnosis not present

## 2019-09-26 DIAGNOSIS — N186 End stage renal disease: Secondary | ICD-10-CM | POA: Diagnosis not present

## 2019-09-26 DIAGNOSIS — I120 Hypertensive chronic kidney disease with stage 5 chronic kidney disease or end stage renal disease: Secondary | ICD-10-CM | POA: Insufficient documentation

## 2019-09-26 DIAGNOSIS — E11622 Type 2 diabetes mellitus with other skin ulcer: Secondary | ICD-10-CM | POA: Insufficient documentation

## 2019-09-26 DIAGNOSIS — G4730 Sleep apnea, unspecified: Secondary | ICD-10-CM | POA: Insufficient documentation

## 2019-09-26 DIAGNOSIS — M069 Rheumatoid arthritis, unspecified: Secondary | ICD-10-CM | POA: Diagnosis not present

## 2019-09-26 DIAGNOSIS — T8131XD Disruption of external operation (surgical) wound, not elsewhere classified, subsequent encounter: Secondary | ICD-10-CM | POA: Diagnosis not present

## 2019-09-26 DIAGNOSIS — X58XXXD Exposure to other specified factors, subsequent encounter: Secondary | ICD-10-CM | POA: Insufficient documentation

## 2019-09-26 DIAGNOSIS — E11649 Type 2 diabetes mellitus with hypoglycemia without coma: Secondary | ICD-10-CM | POA: Diagnosis not present

## 2019-09-26 DIAGNOSIS — E1122 Type 2 diabetes mellitus with diabetic chronic kidney disease: Secondary | ICD-10-CM | POA: Diagnosis not present

## 2019-09-26 DIAGNOSIS — L98498 Non-pressure chronic ulcer of skin of other sites with other specified severity: Secondary | ICD-10-CM | POA: Insufficient documentation

## 2019-09-26 NOTE — Progress Notes (Signed)
[name] [name] [name], [name] [name] [name] ([id number]) Visit Report for 09/26/2019 Arrival Information Details Patient Name: Date of Service: [name] [name] [name]. 09/26/2019 10:45 A [name] [name] Medical Record Number: [id number] Patient Account Number: [id number] Date of Birth/Sex: Treating RN: Dec 01, 1955 (64 y.o. [name] [name] [name], [name] [name] [name] Primary Care [name] [name] [name]: [name] [name] [name] Other Clinician: Referring [name] [name] [name]: Treating [name] [name] [name]: [name] [name] [name] in Treatment: 117 Visit Information History Since Last Visit Added or deleted any medications: No Patient Arrived: Stretcher Any new allergies or adverse reactions: No Arrival Time: 10:17 Had a fall or experienced change in No Accompanied By: transportation activities of daily living that may affect Transfer Assistance: None risk of falls: Patient Identification Verified: Yes Signs or symptoms of abuse/neglect since last visito No Secondary Verification Process Completed: Yes Hospitalized since last visit: No Patient Requires Transmission-Based Precautions: No Implantable device outside of the clinic excluding No Patient Has Alerts: No cellular tissue based products placed in the center since last visit: Has Dressing in Place as Prescribed: Yes Pain Present Now: Yes Electronic Signature(s) Signed: 09/26/2019 4:55:47 PM By: Kela Millin Entered By: Kela Millin on 09/26/2019 10:17:29 -------------------------------------------------------------------------------- Clinic Level of Care Assessment Details Patient Name: Date of Service: [name] [name] [name]. 09/26/2019 10:45 A [name] [name] Medical Record Number: [id number] Patient Account Number: [id number] Date of Birth/Sex: Treating RN: 06-05-1955 (64 y.o. [name] [name] [name] Primary Care [name] [name] [name]: [name] [name] [name] Other Clinician: Referring [name] [name] [name]: Treating [name] [name] [name]/Extender: [name] [name] [name] in Treatment: [name] [name] [name] Clinic Level of Care Assessment Items TOOL 4 Quantity  Score X- 1 0 Use when only an EandM is performed on FOLLOW-UP visit ASSESSMENTS - Nursing Assessment / Reassessment X- 1 10 Reassessment of Co-morbidities (includes updates in patient status) X- 1 5 Reassessment of Adherence to Treatment Plan ASSESSMENTS - Wound and Skin A ssessment / Reassessment []  - 0 Simple Wound Assessment / Reassessment - one wound X- 4 5 Complex Wound Assessment / Reassessment - multiple wounds []  - 0 Dermatologic / Skin Assessment (not related to wound area) ASSESSMENTS - Focused Assessment []  - 0 Circumferential Edema Measurements - multi extremities []  - 0 Nutritional Assessment / Counseling / Intervention []  - 0 Lower Extremity Assessment (monofilament, tuning fork, pulses) []  - 0 Peripheral Arterial Disease Assessment (using hand held doppler) ASSESSMENTS - Ostomy and/or Continence Assessment and Care []  - 0 Incontinence Assessment and Management []  - 0 Ostomy Care Assessment and Management (repouching, etc.) PROCESS - Coordination of Care X - Simple Patient / Family Education for ongoing care 1 15 []  - 0 Complex (extensive) Patient / Family Education for ongoing care X- 1 10 Staff obtains Consents, Records, T Results / Process Orders est X- 1 10 Staff telephones HHA, Nursing Homes / Clarify orders / etc []  - 0 Routine Transfer to another Facility (non-emergent condition) []  - 0 Routine Hospital Admission (non-emergent condition) []  - 0 New Admissions / Biomedical engineer / Ordering NPWT Apligraf, etc. , []  - 0 Emergency Hospital Admission (emergent condition) X- 1 10 Simple Discharge Coordination []  - 0 Complex (extensive) Discharge Coordination PROCESS - Special Needs []  - 0 Pediatric / Minor Patient Management []  - 0 Isolation Patient Management []  - 0 Hearing / Language / Visual special needs []  - 0 Assessment of Community assistance (transportation, D/C planning, etc.) []  - 0 Additional assistance / Altered  mentation []  - 0 Support Surface(s) Assessment (bed, cushion, seat, etc.) INTERVENTIONS - Wound Cleansing / Measurement []  - 0 Simple Wound Cleansing - one wound X- 4 5 Complex Wound  Cleansing - multiple wounds X- 1 5 Wound Imaging (photographs - any number of wounds) []  - 0 Wound Tracing (instead of photographs) []  - 0 Simple Wound Measurement - one wound X- 4 5 Complex Wound Measurement - multiple wounds INTERVENTIONS - Wound Dressings X - Small Wound Dressing one or multiple wounds 4 10 []  - 0 Medium Wound Dressing one or multiple wounds []  - 0 Large Wound Dressing one or multiple wounds X- 1 5 Application of Medications - topical []  - 0 Application of Medications - injection INTERVENTIONS - Miscellaneous []  - 0 External ear exam []  - 0 Specimen Collection (cultures, biopsies, blood, body fluids, etc.) []  - 0 Specimen(s) / Culture(s) sent or taken to Lab for analysis []  - 0 Patient Transfer (multiple staff / Civil Service fast streamer / Similar devices) []  - 0 Simple Staple / Suture removal (25 or less) []  - 0 Complex Staple / Suture removal (26 or more) []  - 0 Hypo / Hyperglycemic Management (close monitor of Blood Glucose) []  - 0 Ankle / Brachial Index (ABI) - do not check if billed separately X- 1 5 Vital Signs Has the patient been seen at the hospital within the last three years: Yes Total Score: 175 Level Of Care: New/Established - Level 5 Electronic Signature(s) Signed: 09/26/2019 5:21:32 PM By: [name] [name] [name] Entered By: [name] Hurst on 09/26/2019 16:54:27 -------------------------------------------------------------------------------- Encounter Discharge Information Details Patient Name: Date of Service: [name] [name] [name]. 09/26/2019 10:45 A [name] [name] Medical Record Number: [id number] Patient Account Number: [id number] Date of Birth/Sex: Treating RN: 1956/01/06 (64 y.o. [name] [name] [name] Primary Care [name] [name] [name]: [name] [name] [name] Other Clinician: Referring  [name] [name] [name]: Treating [name] [name] [name]: [name] [name] [name] in Treatment: 117 Encounter Discharge Information Items Discharge Condition: Stable Ambulatory Status: Stretcher Discharge Destination: Home Transportation: Ambulance Accompanied By: EMS staff Schedule Follow-up Appointment: Yes Clinical Summary of Care: Patient Declined Electronic Signature(s) Signed: 09/26/2019 5:04:18 PM By: Baruch Gouty RN, [name] Entered By: Baruch Gouty on 09/26/2019 11:57:02 -------------------------------------------------------------------------------- Lower Extremity Assessment Details Patient Name: Date of Service: [name] [name] [name]. 09/26/2019 10:45 A [name] [name] Medical Record Number: [id number] Patient Account Number: [id number] Date of Birth/Sex: Treating RN: 1955-10-01 (64 y.o. [name] Primary Care Samatha Anspach: [name] [name] [name] Other Clinician: Referring Von Quintanar: Treating Neldon Shepard/Extender: Seymour Bars Weeks in Treatment: 117 Electronic Signature(s) Signed: 09/26/2019 4:55:47 PM By: Kela Millin Entered By: Kela Millin on 09/26/2019 10:19:43 -------------------------------------------------------------------------------- Sherrill Details Patient Name: Date of Service: [name] [name] [name]. 09/26/2019 10:45 A [name] [name] Medical Record Number: [id number] Patient Account Number: [id number] Date of Birth/Sex: Treating RN: 06/23/1955 (64 y.o. [name] [name] [name] Primary Care Jaylissa Felty: [name] [name] [name] Other Clinician: Referring Didier Brandenburg: Treating Gregori Abril/Extender: Seymour Bars Weeks in Treatment: 117 Active Inactive Wound/Skin Impairment Nursing Diagnoses: Impaired tissue integrity Goals: Patient/caregiver will verbalize understanding of skin care regimen Date Initiated: 09/07/2017 Target Resolution Date: 10/25/2019 Goal Status: Active Ulcer/skin breakdown will have a volume reduction of 30% by week  4 Date Initiated: 06/29/2017 Date Inactivated: 08/04/2017 Target Resolution Date: 09/01/2017 Goal Status: Unmet Unmet Reason: larger Interventions: Assess patient/caregiver ability to obtain necessary supplies Assess patient/caregiver ability to perform ulcer/skin care regimen upon admission and as needed Assess ulceration(s) every visit Provide education on ulcer and skin care Notes: Electronic Signature(s) Signed: 09/26/2019 5:21:32 PM By: [name] [name] [name] Entered By: [name] Hurst on 09/26/2019 16:53:27 -------------------------------------------------------------------------------- Pain Assessment Details Patient Name: Date of Service: [name] [name] [name]. 09/26/2019 10:45  A [name] [name] Medical Record Number: [id number] Patient Account Number: [id number] Date of Birth/Sex: Treating RN: 1955-11-13 (64 y.o. [name] Primary Care Carrel Leather: [name] [name] [name] Other Clinician: Referring Jahnasia Tatum: Treating Lowen Barringer/Extender: Seymour Bars Weeks in Treatment: 117 Active Problems Location of Pain Severity and Description of Pain Patient Has Paino Yes Site Locations Pain Location: Generalized Pain, Pain in Ulcers With Dressing Change: Yes Duration of the Pain. Constant / Intermittento Constant Rate the pain. Current Pain Level: 7 Worst Pain Level: 10 Least Pain Level: 3 Tolerable Pain Level: 4 Character of Pain Describe the Pain: Burning Pain Management and Medication Current Pain Management: Electronic Signature(s) Signed: 09/26/2019 4:55:47 PM By: Kela Millin Entered By: Kela Millin on 09/26/2019 10:19:35 -------------------------------------------------------------------------------- Patient/Caregiver Education Details Patient Name: Date of Service: [name] [name] [name]. 8/5/2021andnbsp10:45 A [name] [name] Medical Record Number: [id number] Patient Account Number: [id number] Date of Birth/Gender: Treating RN: December 29, 1955 (64 y.o. [name] [name] [name] Primary Care Physician: [name] [name] [name] Other Clinician: Referring Physician: Treating Physician/Extender: [name] [name] [name] in Treatment: 117 Education Assessment Education Provided To: Patient Education Topics Provided Wound/Skin Impairment: Methods: Explain/Verbal Responses: State content correctly [employer]) Signed: 09/26/2019 5:21:32 PM By: [name] [name] [name] Entered By: [name] Hurst on 09/26/2019 16:53:39 -------------------------------------------------------------------------------- Wound Assessment Details Patient Name: Date of Service: [name] [name] [name]. 09/26/2019 10:45 A [name] [name] Medical Record Number: [id number] Patient Account Number: [id number] Date of Birth/Sex: Treating RN: 02-19-1956 (64 y.o. [name] [name] [name] Primary Care Raine Blodgett: [name] [name] [name] Other Clinician: Referring Kimon Loewen: Treating Arlene Brickel/Extender: Seymour Bars Weeks in Treatment: 117 Wound Status Wound Number: 10 Primary MASD Etiology: Wound Location: Left Breast Wound Open Wounding Event: Gradually Appeared Status: Date Acquired: 09/26/2019 Comorbid Cataracts, Lymphedema, Sleep Apnea, Hypertension, Type II Weeks Of Treatment: 0 History: Diabetes, End Stage Renal Disease, Gout, Rheumatoid Arthritis, Clustered Wound: No Neuropathy Wound Measurements Length: (cm) 0.5 Width: (cm) 1.5 Depth: (cm) 0.1 Area: (cm) 0.589 Volume: (cm) 0.059 % Reduction in Area: % Reduction in Volume: Epithelialization: None Tunneling: No Undermining: No Wound Description Classification: Full Thickness Without Exposed Support Structures Wound Margin: Flat and Intact Exudate Amount: Medium Exudate Type: Serosanguineous Exudate Color: red, brown Foul Odor After Cleansing: No Wound Bed Granulation Amount: Large (67-100%) Exposed Structure Granulation Quality: Pink Fascia Exposed: No Necrotic Amount: None Present (0%) Fat Layer  (Subcutaneous Tissue) Exposed: Yes Tendon Exposed: No Muscle Exposed: No Joint Exposed: No Bone Exposed: No Treatment Notes Wound #10 (Left Breast) 3. Primary Dressing Applied Calcium Alginate 4. Secondary Dressing ABD Pad 5. Secured With Recruitment consultant) Signed: 09/26/2019 5:21:32 PM By: [name] [name] [name] Entered By: [name] Hurst on 09/26/2019 11:20:36 -------------------------------------------------------------------------------- Wound Assessment Details Patient Name: Date of Service: [name] [name] [name]. 09/26/2019 10:45 A [name] [name] Medical Record Number: [id number] Patient Account Number: [id number] Date of Birth/Sex: Treating RN: 20-Jul-1955 (64 y.o. [name] Primary Care Haylo Fake: [name] [name] [name] Other Clinician: Referring Tavio Biegel: Treating Kyngston Pickelsimer/Extender: Seymour Bars Weeks in Treatment: 117 Wound Status Wound Number: 3 Primary Dehisced Wound Etiology: Wound Location: Right Abdomen - Lower Quadrant Wound Open Wounding Event: Gradually Appeared Status: Date Acquired: 12/07/2017 Comorbid Cataracts, Lymphedema, Sleep Apnea, Hypertension, Type II Weeks Of Treatment: 93 History: Diabetes, End Stage Renal Disease, Gout, Rheumatoid Arthritis, Clustered Wound: No Neuropathy Photos Photo Uploaded By: Mikeal Hawthorne on 09/26/2019 13:29:42 Wound Measurements Length: (cm) 6.2 Width: (cm) 4.7 Depth: (cm) 0.1 Area: (cm) 22.887 Volume: (cm) 2.289 % Reduction in Area: -253.2% %  Reduction in Volume: 60.8% Epithelialization: Medium (34-66%) Tunneling: No Undermining: No Wound Description Classification: Full Thickness Without Exposed Support Structures Wound Margin: Distinct, outline attached Exudate Amount: Medium Exudate Type: Serosanguineous Exudate Color: red, brown Foul Odor After Cleansing: No Slough/Fibrino Yes Wound Bed Granulation Amount: Large (67-100%) Exposed Structure Granulation Quality: Pink Fascia  Exposed: No Necrotic Amount: Small (1-33%) Fat Layer (Subcutaneous Tissue) Exposed: Yes Necrotic Quality: Adherent Slough Tendon Exposed: No Muscle Exposed: No Joint Exposed: No Bone Exposed: No Treatment Notes Wound #3 (Right Abdomen - Lower Quadrant) 3. Primary Dressing Applied Hydrofera Blue 4. Secondary Dressing ABD Pad 5. Secured With Recruitment consultant) Signed: 09/26/2019 4:55:47 PM By: Kela Millin Entered By: Kela Millin on 09/26/2019 10:30:26 -------------------------------------------------------------------------------- Wound Assessment Details Patient Name: Date of Service: [name] [name] [name]. 09/26/2019 10:45 A [name] [name] Medical Record Number: [id number] Patient Account Number: [id number] Date of Birth/Sex: Treating RN: 10-04-1955 (64 y.o. [name] Primary Care Aletha Allebach: [name] [name] [name] Other Clinician: Referring Giulian Goldring: Treating Red Mandt/Extender: Seymour Bars Weeks in Treatment: 117 Wound Status Wound Number: 7 Primary Inflammatory Etiology: Wound Location: Right, Proximal Flank Wound Healed - Epithelialized Wounding Event: Gradually Appeared Status: Date Acquired: 04/17/2019 Comorbid Cataracts, Lymphedema, Sleep Apnea, Hypertension, Type II Weeks Of Treatment: 22 History: Diabetes, End Stage Renal Disease, Gout, Rheumatoid Arthritis, Clustered Wound: No Neuropathy Photos Photo Uploaded By: Mikeal Hawthorne on 09/26/2019 13:29:18 Wound Measurements Length: (cm) Width: (cm) Depth: (cm) Area: (cm) Volume: (cm) 0 % Reduction in Area: 100% 0 % Reduction in Volume: 100% 0 Epithelialization: Large (67-100%) 0 Tunneling: No 0 Undermining: No Wound Description Classification: Full Thickness Without Exposed Support Structures Wound Margin: Distinct, outline attached Exudate Amount: None Present Foul Odor After Cleansing: No Slough/Fibrino No Wound Bed Granulation Amount: None Present (0%) Exposed  Structure Necrotic Amount: None Present (0%) Fascia Exposed: No Fat Layer (Subcutaneous Tissue) Exposed: No Tendon Exposed: No Muscle Exposed: No Joint Exposed: No Bone Exposed: No Electronic Signature(s) Signed: 09/26/2019 4:55:47 PM By: Kela Millin Entered By: Kela Millin on 09/26/2019 10:30:54 -------------------------------------------------------------------------------- Wound Assessment Details Patient Name: Date of Service: [name] [name] [name]. 09/26/2019 10:45 A [name] [name] Medical Record Number: [id number] Patient Account Number: [id number] Date of Birth/Sex: Treating RN: 10-13-1955 (64 y.o. [name] Primary Care Teirra Carapia: [name] [name] [name] Other Clinician: Referring Orvel Cutsforth: Treating Alandria Butkiewicz/Extender: Seymour Bars Weeks in Treatment: 117 Wound Status Wound Number: 8 Primary Lesion Etiology: Wound Location: Left Flank Wound Open Wounding Event: Gradually Appeared Status: Date Acquired: 07/23/2019 Comorbid Cataracts, Lymphedema, Sleep Apnea, Hypertension, Type II Weeks Of Treatment: 8 History: Diabetes, End Stage Renal Disease, Gout, Rheumatoid Arthritis, Clustered Wound: No Neuropathy Photos Photo Uploaded By: Mikeal Hawthorne on 09/26/2019 13:30:00 Wound Measurements Length: (cm) 1.9 Width: (cm) 2.1 Depth: (cm) 0.3 Area: (cm) 3.134 Volume: (cm) 0.94 % Reduction in Area: 6.1% % Reduction in Volume: 6.1% Epithelialization: None Tunneling: No Undermining: No Wound Description Classification: Full Thickness Without Exposed Support Struct Wound Margin: Well defined, not attached Exudate Amount: Medium Exudate Type: Serosanguineous Exudate Color: red, brown ures Foul Odor After Cleansing: No Slough/Fibrino Yes Wound Bed Granulation Amount: Large (67-100%) Exposed Structure Granulation Quality: Red Fascia Exposed: No Necrotic Amount: Small (1-33%) Fat Layer (Subcutaneous Tissue) Exposed: Yes Necrotic Quality: Adherent  Slough Tendon Exposed: No Muscle Exposed: No Joint Exposed: No Bone Exposed: No Treatment Notes Wound #8 (Left Flank) 3. Primary Dressing Applied Calcium Alginate 4. Secondary Dressing ABD Pad 5. Secured With Recruitment consultant) Signed: 09/26/2019 4:55:47 PM By:  Dwiggins, [name] [name] [name] Entered By: Kela Millin on 09/26/2019 10:31:23 -------------------------------------------------------------------------------- Wound Assessment Details Patient Name: Date of Service: [name] [name] [name]. 09/26/2019 10:45 A [name] [name] Medical Record Number: [id number] Patient Account Number: [id number] Date of Birth/Sex: Treating RN: 1955-10-30 (64 y.o. [name] [name] [name] Primary Care Luciano Cinquemani: [name] [name] [name] Other Clinician: Referring Daoud Lobue: Treating Mikeyla Music/Extender: Seymour Bars Weeks in Treatment: 117 Wound Status Wound Number: 9 Primary MASD Etiology: Wound Location: Left, Distal Breast Wound Open Wounding Event: Gradually Appeared Status: Status: Date Acquired: 09/26/2019 Comorbid Cataracts, Lymphedema, Sleep Apnea, Hypertension, Type II Weeks Of Treatment: 0 History: Diabetes, End Stage Renal Disease, Gout, Rheumatoid Arthritis, Clustered Wound: No Neuropathy Wound Measurements Length: (cm) 0.8 Width: (cm) 3 Depth: (cm) 0.1 Area: (cm) 1.885 Volume: (cm) 0.188 % Reduction in Area: % Reduction in Volume: Epithelialization: None Tunneling: No Undermining: No Wound Description Classification: Full Thickness Without Exposed Support Structures Wound Margin: Flat and Intact Exudate Amount: Medium Exudate Type: Serosanguineous Exudate Color: red, brown Foul Odor After Cleansing: No Slough/Fibrino Yes Wound Bed Granulation Amount: Small (1-33%) Exposed Structure Granulation Quality: Pink Fascia Exposed: No Necrotic Amount: Large (67-100%) Fat Layer (Subcutaneous Tissue) Exposed: Yes Necrotic Quality: Adherent Slough Tendon Exposed: No Muscle  Exposed: No Joint Exposed: No Bone Exposed: No Treatment Notes Wound #9 (Left, Distal Breast) 3. Primary Dressing Applied Calcium Alginate 4. Secondary Dressing ABD Pad 5. Secured With Recruitment consultant) Signed: 09/26/2019 5:21:32 PM By: [name] [name] [name] Entered By: [name] Hurst on 09/26/2019 11:19:24 -------------------------------------------------------------------------------- Vitals Details Patient Name: Date of Service: [name] [name] [name]. 09/26/2019 10:45 A [name] [name] Medical Record Number: [id number] Patient Account Number: [id number] Date of Birth/Sex: Treating RN: 05-23-1955 (64 y.o. [name] Primary Care Zamaria Brazzle: [name] [name] [name] Other Clinician: Referring Ciana Simmon: Treating Princes Finger/Extender: Seymour Bars Weeks in Treatment: 117 Vital Signs Time Taken: 10:17 Temperature (F): 98.3 Height (in): 62 Pulse (bpm): 66 Weight (lbs): 260 Respiratory Rate (breaths/min): 20 Body Mass Index (BMI): 47.5 Blood Pressure (mmHg): 158/85 Reference Range: 80 - 120 mg / dl Electronic Signature(s) Signed: 09/26/2019 4:55:47 PM By: Kela Millin Entered By: Kela Millin on 09/26/2019 10:19:13

## 2019-09-26 NOTE — Progress Notes (Addendum)
KORRIN, WATERFIELD ([id number]) Visit Report for 09/26/2019 Chief Complaint Document Details Patient Name: Date of Service: [name] [name]. 09/26/2019 10:45 A M Medical Record Number: [id number] Patient Account Number: [id number] Date of Birth/Sex: Treating RN: 01/30/1956 (64 y.o. [name] Primary Care Provider: York Ram Other Clinician: Referring Provider: Treating Provider/Extender: Idamae Schuller in Treatment: 117 Information Obtained from: Patient Chief Complaint patient is here for review of a draining area left lower abdomen Electronic Signature(s) Signed: 09/26/2019 10:42:03 AM By: Worthy Keeler PA-C Entered By: Worthy Keeler on 09/26/2019 10:42:03 -------------------------------------------------------------------------------- HPI Details Patient Name: Date of Service: [name] Siren M. 09/26/2019 10:45 A M Medical Record Number: [id number] Patient Account Number: [id number] Date of Birth/Sex: Treating RN: Jul 29, 1955 (64 y.o. [name] Primary Care Provider: York Ram Other Clinician: Referring Provider: Treating Provider/Extender: Seymour Bars Weeks in Treatment: 117 History of Present Illness HPI Description: 06/29/17; this is a 65 year old woman who is a poor historian. It appears that she's had limited medical care over the last 2 years or so and what she had his through the [place]. She is here for review of a draining area on her right shoulder anteriorly as well as a nonhealing surgical wound in the left lower abdomen. Per her description she had a hysterectomy in 2006 4 "tumors". About 9 months later she had have another operation in the same area because of "tumors" involving her fallopian tubes and ovary. Sounds as though the surgical wound in this area that took a long time to heal. Apparently the area on her left lower abdomen is been open for the last 6 or 7 months. She is been  applying topical antibiotics. 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 She had a fall in late 2018 and the open area on top of her shoulder. She is had draining pus coming out of this wound since she's been applying topical antibiotics and a Band-Aid. She is not complaining of a lot of pain or fever although she did have chills today. She saw her doctor at the [place] yesterday she doesn't know her name she apparently was referred to a surgeon at the [facility] [place] presumably an orthopedic surgeon to look at that shoulder. She also has a primary doctor in [place] by the name of Dr. Jimmye Norman who referred her here after seeing her within the last month. 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 We don't have a lot of information in her in Epic. Last hospitalization was from 11/21/16 through 11/24/16 with hypoglycemia felt to be secondary to sulfonylureas. There is no mention of a wound at that time. 07/07/17; patient's shoulder cultured MSSA. I put her on clindamycin to had some anaerobic coverage with caution about diarrhea. I had asked her to make an orthopedic follow-up at the [place] with a doctor that made the original surgery but she has not done that yet. She also has a fairly large wound in the left lower abdomen in the middle of his surgical scar. This was apparently gynecologic surgery perhaps fibroid surgery. In any case these wounds are larger today. 07/21/17; the patient went to see the orthopedic doctor at the [facility] who did her original surgery surgery. Per the patient's description the draining area had closed down. He didn't think anything needed to be done there  were no imaging studies. The lower abdominal wound in the left mid pelvis area also looks somewhat better to me. There is less surface material on this still some probing areas but not a lot  of depth. We've been using silver alginate 08/03/17; the patient arrives back in 2 week follow-up. The draining sinus on her right shoulder has not reopened although she is complaining of widespread shoulder and other joint pain. She is using silver alginate to the remaining surgical wound on her abdomen. 08/18/17; the patient has 2 week follow-up appointment. The draining sinus on her right shoulder is not reopened. She is complaining of left shoulder pain today. The presumed surgical wound on her lower abdomen looks quite a bit better and this is really contracted quite nicely. When she first came in the deep sinus she had on the anterior shoulder cultured MSSA. I gave her clindamycin. I arranged for orthopedic follow-up at the [facility] however this is already closed nevertheless this was a deep probing tunnel and I think it needs to be watched for reopening. 09/07/17; surgical wound on her lower abdomen. This as 2 attached leaflets. The larger one is superiorly. A small probing area here with some sanguinous drainage. Surface needed debridement today. We've been using silver alginate however I changed this to silver collagen today 09/15/17. Surgical wound on her lower abdomen. No recent improvement although this is markedly improved from when we first started seeing her although the original problem was a wound on her right shoulder which is long since closed.switched her to Silver collagen last week She arrives in clinic today complaining of abdominal pain "so bad I almost went to the ER". She tells me she had an endoscopy and colonoscopy about a week ago. This was at the [place], I have no way to look at this. She is unaware of any biopsy was done 10/02/17 on evaluation today patient continues to do well in regard to her abdominal wound were using Prisma it's obviously taken some time for this to heal but nonetheless I do think that she is making good progress currently. There does not appear to be any evidence  of infection at this time which is good news. She still does have some tunneling or undermining but again I think this is still doing well. 10/19/17; she continues to make good progress in this surgical abdominal wound using Prisma. This is come in nicely at one point had considerable depth. No evidence of infection. The patient is not complaining of pain in this area but is complaining of diabetic neuropathy pain in her right foot 11/02/17;the patient's wound is superficial and is really continued to make decent progress. I thought this would be more problematic when I first saw however this appears to a filled in nicely there is no depth. It exists in the crevice between what I think is lower abdominal hernias. She comes in complaining of generalized pain however she is running out of her narcotics providedto her by a local pain clinic. She is asking for home health however she is [place] as I understand we cannot order this. 11/16/2017; 2-week follow-up. She has a lower abdominal surgical wound and this appears to be making nice progress closing in nicely each time we see it. This was initially a deep crevice surgical area between what I think is to lower abdominal hernias. In spite of this wound has healthy granulation. There does not appear to be any depth at all. There is probably a lot of tensile  stress on the wound area however in spite of this things actually look better 11/30/2017; 2-week follow-up. She is complaining of left lower quadrant abdominal pain it is difficult to characterize this clearly episodic and not really associated with nausea or vomiting. She follows at the [place]. T me she was recently seen there by her new primary physician. The wound is in the lower ells abdomen in the middle of what I think was a gynecologic surgical site. We have made progress with this. 12/14/2017; 2-week follow-up. The patient comes in today with an improved wound area on the lower mid abdomen surgical scar  which was the wound we have been following her for. Paradoxically she has developed a new open area just to the right of the midline almost a mirror imaging wound on the right. She states this came up about a week ago. She does not have a clear explanation. Both of these wounds are in surgical scar areas although from her I think this surgery was remote. She denies scratching this or traumatizing it. It is surrounded by 2 large areas that I suspect are herniations but I still cannot see how this would have come from trauma. 12/28/2017; the area on the left surgical scar in the lower abdomen is better however the area on the right that we identified last time is worse. Increase in dimensions. There is no obvious infection here. We have been using silver alginate. She tells me she has been to the ER at the [place] and they gave her doxycycline she also saw her primary doctor yesterday. I think she needs to be referred to dermatology consideration of a skin biopsy question panniculitis 01/11/2018; we continue to have improvement in the left lower mid abdominal wound however the area on the right that we identified in late October has gotten quite a bit worse. There is not appear to be surrounding infection although the patient is complaining of a lot of pain. We have been using silver alginate. [facility] I think this should help 01/24/18 on evaluation today patient actually apparently has two abdominal wounds the one on the right is new here and unfortunately seems to be doing a little bit worse and that it's a little bit larger. Fortunately there does not appear to be any sign of infection at this time. No fevers, chills, nausea, or vomiting noted at this time. Overall again it sounds like this is a fairly complex issue and despite the fact that we been using silver nitrate in the [facility] Dressing still has some hyper granulation. No fevers,  chills, nausea, or vomiting noted at this time. 01/31/18 plan evaluation today patient appears to be doing better in general in regard to her wounds. She has been tolerating the dressing changes without complication. I do feel like that right now she's making some progress so she still is having some discomfort. 02/07/18 on evaluation today patient actually appears to be doing rather well in regard to her double ulcers as far as I'm concerned. With that being said she is somewhat frustrated in general in regard to the overall appearance of everything. I feel like this is unwarranted however since she seems to be making progress. 02/15/2018; lower mid abdomen right much greater than left although the left was the original wound. The area on the left looks really quite good and is smaller. The area on the right seems longer and measures worse in terms of dimensions.  These areas are in the most dependent part of her large abdominal pannus and this may be the entire issue. at [facility] on the left there is lymphedema around the wound. The patient is seeing dermatology next week apparently in [place] 03/01/2017; she likely had abdomen. Right now he other than left ankle both he has better today. I think both of these areas are smaller. Surface looks healthier she is due to see dermatology later this month. Using [facility] 1/23; abdominal wounds in the lower mid abdomen. This is in a very dependent area in her lower abdominal pannus and the simple tension in this area may have a lot to do with this. She also has constant chronic pruritus and scratching and I wonder if this has something to do with open wounds in this area. She tells me her daughter is helping her with the dressings currently and the original wound on the left side of her mid abdomen is better almost closed the larger area on the right also looks some better. She sees a dermatologist next week I have told her to ask the dermatologist  for help with generalized pruritus. The probable skin wounds that she has all over secondary to scratching. The wounds we have been treating may be related to simple tension in a dependent lymphedematous area. She could benefit from an abdominal binder to help support this area 2/13; abdominal wounds in the lower mid abdomen. Very dependent in her abdomen and this may be secondary to tension in the lower part of her abdominal pannus plus or minus edema. 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 She claims not to be scratching at the wound areas. 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 They give her gentian violet to put on the wound area but this is already present and Hydrofera Blue. The original wound on the left is healed today. However the area on the right part of her mid abdominal pannus appears larger. We are going to try to work with her on getting an abdominal binder to help support the weight of the pannus around the wounds 3/9; abdominal wound in the lower mid abdomen. She apparently has an abdominal binder that she bought on [employer]. She is using Hydrofera Blue to the wound on the right of the midline. The original wound was on the left and is closed over. 3/26; abdominal wound in the lower mid abdomen. This is her second wound in this area. The lower part of her abdominal pannus surrounding surgical scar. Once again she is not wearing the abdominal binder she said she obtained. The dressing is Hydrofera Blue 4/16; abdomen in the right lower mid abdomen. This is the second wound in this area the other was a mirror image area on the left. I had her seen by dermatology at the [facility] this was not very helpful. She is not wearing her abdominal binder. We have been using Hydrofera  Blue which is what healed at the other side. The abdominal wound is actually larger She arrives in clinic today complaining of the plantar right heel. Very tender. She has been pushing herself up in her bed with her heels and this may be a deep tissue injury. 4/23; abdomen wound is about the same. Spreading along the folds of her pannus  somewhat but generally the surface looks somewhat better. She is not using her abdominal binder although she did bring it in this time She arrives in clinic complaining of pain and itching in the right lateral rib area. She has 3 open areas here which are circular wounds. These are new She also mentions her lower sacrum and there are 2 wounds in this area. These are new 5/7; abdominal wound is about the same although in general it looks better than a few weeks ago at which time it had punched out depth. No debridement is required. She will not wear her abdominal binder She has areas on her right lateral rib cage 3 open areas circular wounds these look about the same as last time. I am assuming these have something to do with the prurigo nodularis that she may have scratched but I am not certain. Finally she has a linear area on her lower sacrum which I am assuming is a pressure area 5/21-Patient returns at 2 weeks for abdominal wound, areas on the right lateral rib cage, and lower sacrum linear area which is a pressure sore. We have been using silver alginate to all these wounds. She has been asked to wear abdominal binder which she is not. she has 2 new wounds on right flank area and on her coccyx. she has a total of 6 wounds 6/9; I have been following this patient for a prolonged period of time for wounds on her lower mid abdomen which I think were secondary to severe lymphedema. She had an area on the left of the midline that eventually healed only to open on the right side. This is still open we have been using silver alginate. She will not use an abdominal  binder. About a month ago she arrived here with open areas on the right lateral ribs. This is in the middle of a large amount of soft tissue/pannus fold. She has 4 open areas here at this time. Finally she has 2 open areas in her lower coccyx. We have been using silver alginate on all the wound areas 6/25; we are following this woman for wounds on her lower mid abdomen in the setting of a pannus and severe dependent lymphedema. More recently she has had areas in the folds on the right lateral rib area. Culture I did of this area last time was negative. Finally she has an area on the coccyx. We have been using silver alginate to all the wound areas 7/9; this is a patient with morbid obesity severe lymphedema in her lower abdominal pannus/severe dependent abdominal lymphedema. She first came to this clinic with an area on the left lower central abdomen. This eventually healed over. About the same time as it did heal over she developed an area in close juxtaposition on the other side of the midline. We have been following this for some months when she was here last time I thought things were improving although she a lot arrives today with a larger wound. She will not use her external abdominal binder. Over the last several weeks she has had areas on her right lateral rib area. The exact etiology of this is unclear we have been using silver alginate. She arrives today with the entire area inflamed angry with loss of surface epithelium and marked pruritus. I think this is tinea/Candida. This could be secondary or the primary in the etiology of these expanding areas. 7/23; the area on the right lateral rib areas is quite a bit better.  Skin that we treated with oral and topical antifungals looks a lot better around this area. Unfortunately the lower abdominal area actually measures larger. There is no drainage here really no surrounding erythema. 8/6-Patient arrives to the clinic at 3 weeks, the right  lateral rib cage area has copious drainage but the wounds look a little better, the abdominal midline wound unfortunately is larger, she is not able to use the binder both wounds have a clean base 8/20. Since the patient was last here she was hospitalized from 813 through 818. This was predominantly because of hyperkalemia secondary to lisinopril and dehydration. Lisinopril was discontinued at discharge. She was also felt to have mild cellulitis around several skin ulcers she was given antibiotics in the hospital but not discharged on antibiotics. Also noted that she has stage IV chronic kidney disease with a baseline creatinine at 1.7. 9/3 the patient still has a 2 wound areas lower mid abdominal pannus and the right lateral ribs. We have been using polymen to the abdominal wound and silver alginate to the area on the folds on her right lateral ribs. She is complaining about pruritus around the areas on the ribs. 9/17; 2-week follow-up. The area on the mid lower abdomen better using PolyMem. She has 4 small areas in the pannus fold on the right lateral ribs and is a new area on the left lateral lower abdominal folds this week. 10/1; 2-week follow-up. She has been using PolyMem on the mid lower abdomen and really not much improvement. She has this for small areas in the pannus fold of the right lateral ribs. These are gradually I think improving. The area that was new last time on the left lower abdominal pannus has closed over. 10/15; 2-week follow-up. I changed to silver collagen on the mid lower abdomen wound which seems smaller today. She is still using silver alginate on the right lateral ribs and a pannus fold and this looks better today although she is complaining of itching in this area She has had an outbreak on her face which almost looks like cystic acne. She is going to see her primary doctor about this tomorrow 11/5; silver collagen on the mid lower abdomen which seems to be doing a good  job we are using silver alginate in the pannus folds on her right lateral ribs. She is complaining of pruritus in this area which is probably intertrigo/Candida. I will prescribe nystatin powder 12/3. We are using silver collagen to her mid lower abdomen which looks better. The pannus area on the right lateral ribs we are using silver alginate she is pointing nystatin powder in here. The wounds look better. She is separating the folds with hand towels. We have suggested Interdry and she is going to look into getting that online 12/28; 1 month follow-up. In general the area on her mid lower abdomen looks a lot better. Surface area is smaller we have been using silver collagen. The pannus area on the right lateral ribs we have been using alginate. She also is putting Interdry in these areas. 1/25; 1 month follow-up. The area on her abdomen is about the same. We have been using silver collagen here. The areas on her right thorax and the pannus folds in this area are down to 3 small wounds we have been using silver alginate here 3/1; the area on her pannus of the lower abdomen on the right of midline is about the same. We have been using silver collagen for a protracted period  and previously we have used Hydrofera Blue. The area is in a pannus fold in her right lateral thorax actually are smaller. There are 3 wounds in this area. She is using calcium alginate here. She is complaining of increasing ankle edema she saw her primary doctor at the [place] in [place] and had her Lasix increased otherwise she has no additional new complaint 4/8; the area on the pannus of her lower abdomen looks somewhat better. We have been using polymen. The area in the pannus in her right lateral thorax are down to 2 small open areas. We have been using calcium alginate 5/18; monthly follow-up visit. The wound on the lower abdomen actually looks some better. We changed her to polymen last time. We have been using alginate in  the areas in the pannus folds on her right lateral thorax 1 of these is healed everything else looks a lot better. 6/10; 3-week follow-up. The patient has a new wound in the left lateral flank area that is been there about a week. Home health called about this. In the meantime her original wound on the lower right abdominal midline is really healthy looking. In the area in the folds of her right lateral thorax are just about closed. The new area is on the left lateral abdomen/flank area. 09/26/2019 upon evaluation today patient appears to be doing about the same in regard to her wounds in general. She has been tolerating the dressing changes without complication. Fortunately there is no signs of active infection at this time. No fevers, chills, nausea, vomiting, or diarrhea. She does have 2 new areas on the breast area unfortunately. Electronic Signature(s) Signed: 09/26/2019 11:24:07 AM By: Worthy Keeler PA-C Entered By: Worthy Keeler on 09/26/2019 11:24:06 -------------------------------------------------------------------------------- Physical Exam Details Patient Name: Date of Service: [name] [name]. 09/26/2019 10:45 A M Medical Record Number: [id number] Patient Account Number: [id number] Date of Birth/Sex: Treating RN: November 10, 1955 (64 y.o. [name] Primary Care Provider: York Ram Other Clinician: Referring Provider: Treating Provider/Extender: Seymour Bars Weeks in Treatment: 117 Constitutional Obese and well-hydrated in no acute distress. Respiratory normal breathing without difficulty. Psychiatric this patient is able to make decisions and demonstrates good insight into disease process. Alert and Oriented x 3. pleasant and cooperative. Notes Patient's wound bed currently showed signs of fairly good granulation at all locations. Do think that we may want to switch from PolyMem to the [facility] on the abdominal wound and the other  wounds will continue to use the alginate which I think is probably the best thing at this point as well. Electronic Signature(s) Signed: 09/26/2019 11:24:51 AM By: Worthy Keeler PA-C Previous Signature: 09/26/2019 11:24:29 AM Version By: Worthy Keeler PA-C Entered By: Worthy Keeler on 09/26/2019 11:24:50 -------------------------------------------------------------------------------- Physician Orders Details Patient Name: Date of Service: [name] Siren M. 09/26/2019 10:45 A M Medical Record Number: [id number] Patient Account Number: [id number] Date of Birth/Sex: Treating RN: [birth year] (64 y.o. [name] Primary Care Provider: York Ram Other Clinician: Referring Provider: Treating Provider/Extender: Idamae Schuller in Treatment: [phone] Verbal / Phone Orders: No Diagnosis Coding ICD-10 Coding Code Description T81.31XD Disruption of external operation (surgical) wound, not elsewhere classified, subsequent encounter S31.104D Unspecified open wound of abdominal wall, left lower quadrant without penetration into peritoneal cavity, subsequent encounter L98.498 Non-pressure chronic ulcer of skin of other sites with other specified severity I89.0 Lymphedema, not elsewhere classified B35.4 Tinea corporis Follow-up Appointments  Return appointment in 1 month. - ****ROOM 5 - STRETCHER**** Dressing Change Frequency Other: - all wounds - change dressing twice a week. Skin Barriers/Peri-Wound Care Skin Prep Wound Cleansing May shower and wash wound with soap and water. Primary Wound Dressing Wound #3 Right Abdomen - Lower Quadrant Hydrofera Blue Wound #10 Left Breast Calcium Alginate Wound #8 Left Flank Calcium Alginate Wound #9 Left,Distal Breast Calcium Alginate Secondary Dressing Wound #10 Left Breast Dry Gauze ABD pad - secure with tape Wound #3 Right Abdomen - Lower Quadrant Dry Gauze ABD pad - secure with tape Wound #8 Left  Flank Dry Gauze ABD pad - secure with tape Wound #9 Left,Distal Breast Dry Gauze ABD pad - secure with tape Off-Loading Turn and reposition every 2 hours Other: - abdominal binder to support abdomen. Patient to float right heel while in chair or bed with pillow. Patient to keep pressure off right heel as much as possible. Roll up a face cloth or a pillowcase to separate all skin folds- under breast, abdomen, both flank areas. Additional Orders / Instructions Other: - Harrel Lemon lift will need to be ordered by [facility] for wound care. - [facility] Electronic Signature(s) Signed: 09/26/2019 5:21:32 PM By: Levan Hurst RN, BSN Signed: 09/26/2019 6:02:08 PM By: Worthy Keeler PA-C Entered By: Levan Hurst on 09/26/2019 11:22:36 -------------------------------------------------------------------------------- Problem List Details Patient Name: Date of Service: [name] Siren M. 09/26/2019 10:45 A M Medical Record Number: [id number] Patient Account Number: [id number] Date of Birth/Sex: Treating RN: [birth year] (64 y.o. [name] Primary Care Provider: York Ram Other Clinician: Referring Provider: Treating Provider/Extender: Idamae Schuller in Treatment: [phone] Active Problems ICD-10 Encounter Code Description Active Date MDM Diagnosis T81.31XD Disruption of external operation (surgical) wound, not elsewhere classified, 06/29/2017 No Yes subsequent encounter S31.104D Unspecified open wound of abdominal wall, left lower quadrant without 06/29/2017 No Yes penetration into peritoneal cavity, subsequent encounter L98.498 Non-pressure chronic ulcer of skin of other sites with other specified severity 06/14/2018 No Yes I89.0 Lymphedema, not elsewhere classified 08/30/2018 No Yes B35.4 Tinea corporis 08/30/2018 No Yes Inactive Problems ICD-10 Code Description Active Date Inactive Date M71.011 Abscess of bursa, right  shoulder 06/29/2017 06/29/2017 L89.616 Pressure-induced deep tissue damage of right heel 06/07/2018 06/07/2018 L89.150 Pressure ulcer of sacral region, unstageable 06/14/2018 06/14/2018 Resolved Problems Electronic Signature(s) Signed: 09/26/2019 10:41:45 AM By: Worthy Keeler PA-C Entered By: Worthy Keeler on 09/26/2019 10:41:45 -------------------------------------------------------------------------------- Progress Note Details Patient Name: Date of Service: [name] Siren M. 09/26/2019 10:45 A M Medical Record Number: [id number] Patient Account Number: [id number] Date of Birth/Sex: Treating RN: 1955-09-29 (64 y.o. [name] Primary Care Provider: York Ram Other Clinician: Referring Provider: Treating Provider/Extender: Idamae Schuller in Treatment: 117 Subjective Chief Complaint Information obtained from Patient patient is here for review of a draining area left lower abdomen History of Present Illness (HPI) 06/29/17; this is a 64 year old woman who is a poor historian. It appears that she's had limited medical care over the last 2 years or so and what she had his through the [place]. She is here for review of a draining area on her right shoulder anteriorly as well as a nonhealing surgical wound in the left lower abdomen. Per her description she had a hysterectomy in 2006 4 "tumors". About 9 months later she had have another operation in the same area because of "tumors" involving her fallopian tubes and ovary. Sounds  as though the surgical wound in this area that took a long time to heal. Apparently the area on her left lower abdomen is been open for the last 6 or 7 months. She is been applying topical antibiotics. 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 She had a fall in late 2018 and the open area on top of her shoulder. She is had draining pus coming out of this  wound since she's been applying topical antibiotics and a Band-Aid. She is not complaining of a lot of pain or fever although she did have chills today. She saw her doctor at the [place] yesterday she doesn't know her name she apparently was referred to a surgeon at the [facility] and [place] presumably an orthopedic surgeon to look at that shoulder. She also has a primary doctor in [place] by the name of Dr. Jimmye Norman who referred her here after seeing her within the last month. 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 We don't have a lot of information in her in Epic. Last hospitalization was from 11/21/16 through 11/24/16 with hypoglycemia felt to be secondary to sulfonylureas. There is no mention of a wound at that time. 07/07/17; patient's shoulder cultured MSSA. I put her on clindamycin to had some anaerobic coverage with caution about diarrhea. I had asked her to make an orthopedic follow-up at the [place] with a doctor that made the original surgery but she has not done that yet. ooShe also has a fairly large wound in the left lower abdomen in the middle of his surgical scar. This was apparently gynecologic surgery perhaps fibroid surgery. In any case these wounds are larger today. 07/21/17; the patient went to see the orthopedic doctor at the [facility] who did her original surgery surgery. Per the patient's description the draining area had closed down. He didn't think anything needed to be done there were no imaging studies. ooThe lower abdominal wound in the left mid pelvis area also looks somewhat better to me. There is less surface material on this still some probing areas but not a lot of depth. We've been using silver alginate 08/03/17; the patient arrives back in 2 week follow-up. The draining sinus on her right shoulder has not reopened although she is complaining of widespread shoulder and other joint pain. She is using silver alginate to the  remaining surgical wound on her abdomen. 08/18/17; the patient has 2 week follow-up appointment. The draining sinus on her right shoulder is not reopened. She is complaining of left shoulder pain today. The presumed surgical wound on her lower abdomen looks quite a bit better and this is really contracted quite nicely. When she first came in the deep sinus she had on the anterior shoulder cultured MSSA. I gave her clindamycin. I arranged for orthopedic follow-up at the [facility] however this is already closed nevertheless this was a deep probing tunnel and I think it needs to be watched for reopening. 09/07/17; surgical wound on her lower abdomen. This as 2 attached leaflets. The larger one is superiorly. A small probing area here with some sanguinous drainage. Surface needed debridement today. We've been using silver alginate however I changed this to silver collagen today 09/15/17. Surgical wound on her lower abdomen. No recent improvement although this is markedly improved from when we first started seeing her although the original problem was a wound on her right shoulder which is  long since closed.switched her to Silver collagen last week She arrives in clinic today complaining of abdominal pain "so bad I almost went to the ER". She tells me she had an endoscopy and colonoscopy about a week ago. This was at the [place], I have no way to look at this. She is unaware of any biopsy was done 10/02/17 on evaluation today patient continues to do well in regard to her abdominal wound were using Prisma it's obviously taken some time for this to heal but nonetheless I do think that she is making good progress currently. There does not appear to be any evidence of infection at this time which is good news. She still does have some tunneling or undermining but again I think this is still doing well. 10/19/17; she continues to make good progress in this surgical abdominal wound using Prisma. This is come in nicely at one  point had considerable depth. No evidence of infection. The patient is not complaining of pain in this area but is complaining of diabetic neuropathy pain in her right foot 11/02/17;the patient's wound is superficial and is really continued to make decent progress. I thought this would be more problematic when I first saw however this appears to a filled in nicely there is no depth. It exists in the crevice between what I think is lower abdominal hernias. She comes in complaining of generalized pain however she is running out of her narcotics providedto her by a local pain clinic. She is asking for home health however she is [place] as I understand we cannot order this. 11/16/2017; 2-week follow-up. She has a lower abdominal surgical wound and this appears to be making nice progress closing in nicely each time we see it. This was initially a deep crevice surgical area between what I think is to lower abdominal hernias. In spite of this wound has healthy granulation. There does not appear to be any depth at all. There is probably a lot of tensile stress on the wound area however in spite of this things actually look better 11/30/2017; 2-week follow-up. She is complaining of left lower quadrant abdominal pain it is difficult to characterize this clearly episodic and not really associated with nausea or vomiting. She follows at the [place]. T me she was recently seen there by her new primary physician. The wound is in the lower ells abdomen in the middle of what I think was a gynecologic surgical site. We have made progress with this. 12/14/2017; 2-week follow-up. The patient comes in today with an improved wound area on the lower mid abdomen surgical scar which was the wound we have been following her for. Paradoxically she has developed a new open area just to the right of the midline almost a mirror imaging wound on the right. She states this came up about a week ago. She does not have a clear explanation. Both  of these wounds are in surgical scar areas although from her I think this surgery was remote. She denies scratching this or traumatizing it. It is surrounded by 2 large areas that I suspect are herniations but I still cannot see how this would have come from trauma. 12/28/2017; the area on the left surgical scar in the lower abdomen is better however the area on the right that we identified last time is worse. Increase in dimensions. There is no obvious infection here. We have been using silver alginate. She tells me she has been to the ER at the [place] and they  gave her doxycycline she also saw her primary doctor yesterday. I think she needs to be referred to dermatology consideration of a skin biopsy question panniculitis 01/11/2018; we continue to have improvement in the left lower mid abdominal wound however the area on the right that we identified in late October has gotten quite a bit worse. There is not appear to be surrounding infection although the patient is complaining of a lot of pain. We have been using silver alginate. [facility] I think this should help 01/24/18 on evaluation today patient actually apparently has two abdominal wounds the one on the right is new here and unfortunately seems to be doing a little bit worse and that it's a little bit larger. Fortunately there does not appear to be any sign of infection at this time. No fevers, chills, nausea, or vomiting noted at this time. Overall again it sounds like this is a fairly complex issue and despite the fact that we been using silver nitrate in the [facility] Dressing still has some hyper granulation. No fevers, chills, nausea, or vomiting noted at this time. 01/31/18 plan evaluation today patient appears to be doing better in general in regard to her wounds. She has been tolerating the dressing changes without complication. I do feel like that right now she's making some  progress so she still is having some discomfort. 02/07/18 on evaluation today patient actually appears to be doing rather well in regard to her double ulcers as far as I'm concerned. With that being said she is somewhat frustrated in general in regard to the overall appearance of everything. I feel like this is unwarranted however since she seems to be making progress. 02/15/2018; lower mid abdomen right much greater than left although the left was the original wound. The area on the left looks really quite good and is smaller. The area on the right seems longer and measures worse in terms of dimensions. These areas are in the most dependent part of her large abdominal pannus and this may be the entire issue. at [facility] on the left there is lymphedema around the wound. The patient is seeing dermatology next week apparently in [place] 03/01/2017; she likely had abdomen. Right now he other than left ankle both he has better today. I think both of these areas are smaller. Surface looks healthier she is due to see dermatology later this month. Using [facility] 1/23; abdominal wounds in the lower mid abdomen. This is in a very dependent area in her lower abdominal pannus and the simple tension in this area may have a lot to do with this. She also has constant chronic pruritus and scratching and I wonder if this has something to do with open wounds in this area. She tells me her daughter is helping her with the dressings currently and the original wound on the left side of her mid abdomen is better almost closed the larger area on the right also looks some better. She sees a dermatologist next week I have told her to ask the dermatologist for help with generalized pruritus. The probable skin wounds that she has all over secondary to scratching. The wounds we have been treating may be related to simple tension in a dependent lymphedematous area. She could benefit from an abdominal binder to help  support this area 2/13; abdominal wounds in the lower mid abdomen. Very dependent in her abdomen and this may be secondary to tension in  the lower part of her abdominal pannus plus or minus edema. 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 She claims not to be scratching at the wound areas. 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 They give her gentian violet to put on the wound area but this is already present and Hydrofera Blue. The original wound on the left is healed today. However the area on the right part of her mid abdominal pannus appears larger. We are going to try to work with her on getting an abdominal binder to help support the weight of the pannus around the wounds 3/9; abdominal wound in the lower mid abdomen. She apparently has an abdominal binder that she bought on [employer]. She is using Hydrofera Blue to the wound on the right of the midline. The original wound was on the left and is closed over. 3/26; abdominal wound in the lower mid abdomen. This is her second wound in this area. The lower part of her abdominal pannus surrounding surgical scar. Once again she is not wearing the abdominal binder she said she obtained. The dressing is Hydrofera Blue 4/16; abdomen in the right lower mid abdomen. This is the second wound in this area the other was a mirror image area on the left. I had her seen by dermatology at the [facility] this was not very helpful. She is not wearing her abdominal binder. We have been using Hydrofera Blue which is what healed at the other side. The abdominal wound is actually larger She arrives in clinic today complaining of the plantar right heel. Very tender. She has been pushing herself up in her bed with her heels and this may be a deep tissue  injury. 4/23; abdomen wound is about the same. Spreading along the folds of her pannus somewhat but generally the surface looks somewhat better. She is not using her abdominal binder although she did bring it in this time ooShe arrives in clinic complaining of pain and itching in the right lateral rib area. She has 3 open areas here which are circular wounds. These are new ooShe also mentions her lower sacrum and there are 2 wounds in this area. These are new 5/7; abdominal wound is about the same although in general it looks better than a few weeks ago at which time it had punched out depth. No debridement is required. She will not wear her abdominal binder ooShe has areas on her right lateral rib cage 3 open areas circular wounds these look about the same as last time. I am assuming these have something to do with the prurigo nodularis that she may have scratched but I am not certain. Finally she has a linear area on her lower sacrum which I am assuming is a pressure area 5/21-Patient returns at 2 weeks for abdominal wound, areas on the right lateral rib cage, and lower sacrum linear area which is a pressure sore. We have been using silver alginate to all these wounds. She has been asked to wear abdominal binder which she is not. she has 2 new wounds on right flank area and on her coccyx. she has a total of 6 wounds 6/9; I have been following this patient for a prolonged period of time for wounds on her lower mid abdomen which I think were secondary to  severe lymphedema. She had an area on the left of the midline that eventually healed only to open on the right side. This is still open we have been using silver alginate. She will not use an abdominal binder. About a month ago she arrived here with open areas on the right lateral ribs. This is in the middle of a large amount of soft tissue/pannus fold. She has 4 open areas here at this time. Finally she has 2 open areas in her lower coccyx. We  have been using silver alginate on all the wound areas 6/25; we are following this woman for wounds on her lower mid abdomen in the setting of a pannus and severe dependent lymphedema. More recently she has had areas in the folds on the right lateral rib area. Culture I did of this area last time was negative. Finally she has an area on the coccyx. We have been using silver alginate to all the wound areas 7/9; this is a patient with morbid obesity severe lymphedema in her lower abdominal pannus/severe dependent abdominal lymphedema. She first came to this clinic with an area on the left lower central abdomen. This eventually healed over. About the same time as it did heal over she developed an area in close juxtaposition on the other side of the midline. We have been following this for some months when she was here last time I thought things were improving although she a lot arrives today with a larger wound. She will not use her external abdominal binder. Over the last several weeks she has had areas on her right lateral rib area. The exact etiology of this is unclear we have been using silver alginate. She arrives today with the entire area inflamed angry with loss of surface epithelium and marked pruritus. I think this is tinea/Candida. This could be secondary or the primary in the etiology of these expanding areas. 7/23; the area on the right lateral rib areas is quite a bit better. Skin that we treated with oral and topical antifungals looks a lot better around this area. Unfortunately the lower abdominal area actually measures larger. There is no drainage here really no surrounding erythema. 8/6-Patient arrives to the clinic at 3 weeks, the right lateral rib cage area has copious drainage but the wounds look a little better, the abdominal midline wound unfortunately is larger, she is not able to use the binder both wounds have a clean base 8/20. Since the patient was last here she was  hospitalized from 813 through 818. This was predominantly because of hyperkalemia secondary to lisinopril and dehydration. Lisinopril was discontinued at discharge. She was also felt to have mild cellulitis around several skin ulcers she was given antibiotics in the hospital but not discharged on antibiotics. Also noted that she has stage IV chronic kidney disease with a baseline creatinine at 1.7. 9/3 the patient still has a 2 wound areas lower mid abdominal pannus and the right lateral ribs. We have been using polymen to the abdominal wound and silver alginate to the area on the folds on her right lateral ribs. She is complaining about pruritus around the areas on the ribs. 9/17; 2-week follow-up. The area on the mid lower abdomen better using PolyMem. She has 4 small areas in the pannus fold on the right lateral ribs and is a new area on the left lateral lower abdominal folds this week. 10/1; 2-week follow-up. She has been using PolyMem on the mid lower abdomen and really not much improvement.  She has this for small areas in the pannus fold of the right lateral ribs. These are gradually I think improving. The area that was new last time on the left lower abdominal pannus has closed over. 10/15; 2-week follow-up. I changed to silver collagen on the mid lower abdomen wound which seems smaller today. She is still using silver alginate on the right lateral ribs and a pannus fold and this looks better today although she is complaining of itching in this area She has had an outbreak on her face which almost looks like cystic acne. She is going to see her primary doctor about this tomorrow 11/5; silver collagen on the mid lower abdomen which seems to be doing a good job we are using silver alginate in the pannus folds on her right lateral ribs. She is complaining of pruritus in this area which is probably intertrigo/Candida. I will prescribe nystatin powder 12/3. We are using silver collagen to her mid  lower abdomen which looks better. The pannus area on the right lateral ribs we are using silver alginate she is pointing nystatin powder in here. The wounds look better. She is separating the folds with hand towels. We have suggested Interdry and she is going to look into getting that online 12/28; 1 month follow-up. In general the area on her mid lower abdomen looks a lot better. Surface area is smaller we have been using silver collagen. The pannus area on the right lateral ribs we have been using alginate. She also is putting Interdry in these areas. 1/25; 1 month follow-up. The area on her abdomen is about the same. We have been using silver collagen here. The areas on her right thorax and the pannus folds in this area are down to 3 small wounds we have been using silver alginate here 3/1; the area on her pannus of the lower abdomen on the right of midline is about the same. We have been using silver collagen for a protracted period and previously we have used Hydrofera Blue. ooThe area is in a pannus fold in her right lateral thorax actually are smaller. There are 3 wounds in this area. She is using calcium alginate here. She is complaining of increasing ankle edema she saw her primary doctor at the [place] in [place] and had her Lasix increased otherwise she has no additional new complaint 4/8; the area on the pannus of her lower abdomen looks somewhat better. We have been using polymen. The area in the pannus in her right lateral thorax are down to 2 small open areas. We have been using calcium alginate 5/18; monthly follow-up visit. The wound on the lower abdomen actually looks some better. We changed her to polymen last time. We have been using alginate in the areas in the pannus folds on her right lateral thorax 1 of these is healed everything else looks a lot better. 6/10; 3-week follow-up. The patient has a new wound in the left lateral flank area that is been there about a week. Home  health called about this. In the meantime her original wound on the lower right abdominal midline is really healthy looking. In the area in the folds of her right lateral thorax are just about closed. The new area is on the left lateral abdomen/flank area. 09/26/2019 upon evaluation today patient appears to be doing about the same in regard to her wounds in general. She has been tolerating the dressing changes without complication. Fortunately there is no signs of active infection at  this time. No fevers, chills, nausea, vomiting, or diarrhea. She does have 2 new areas on the breast area unfortunately. Objective Constitutional Obese and well-hydrated in no acute distress. Vitals Time Taken: 10:17 AM, Height: 62 in, Weight: 260 lbs, BMI: 47.5, Temperature: 98.3 F, Pulse: 66 bpm, Respiratory Rate: 20 breaths/min, Blood Pressure: 158/85 mmHg. Respiratory normal breathing without difficulty. Psychiatric this patient is able to make decisions and demonstrates good insight into disease process. Alert and Oriented x 3. pleasant and cooperative. General Notes: Patient's wound bed currently showed signs of fairly good granulation at all locations. Do think that we may want to switch from PolyMem to the [facility] on the abdominal wound and the other wounds will continue to use the alginate which I think is probably the best thing at this point as well. Integumentary (Hair, Skin) Wound #10 status is Open. Original cause of wound was Gradually Appeared. The wound is located on the Left Breast. The wound measures 0.5cm length x 1.5cm width x 0.1cm depth; 0.589cm^2 area and 0.059cm^3 volume. There is Fat Layer (Subcutaneous Tissue) Exposed exposed. There is no tunneling or undermining noted. There is a medium amount of serosanguineous drainage noted. The wound margin is flat and intact. There is large (67-100%) pink granulation within the wound bed. There is no necrotic tissue within the wound  bed. Wound #3 status is Open. Original cause of wound was Gradually Appeared. The wound is located on the Right Abdomen - Lower Quadrant. The wound measures 6.2cm length x 4.7cm width x 0.1cm depth; 22.887cm^2 area and 2.289cm^3 volume. There is Fat Layer (Subcutaneous Tissue) Exposed exposed. There is no tunneling or undermining noted. There is a medium amount of serosanguineous drainage noted. The wound margin is distinct with the outline attached to the wound base. There is large (67-100%) pink granulation within the wound bed. There is a small (1-33%) amount of necrotic tissue within the wound bed including Adherent Slough. Wound #7 status is Healed - Epithelialized. Original cause of wound was Gradually Appeared. The wound is located on the Right,Proximal Flank. The wound measures 0cm length x 0cm width x 0cm depth; 0cm^2 area and 0cm^3 volume. There is no tunneling or undermining noted. There is a none present amount of drainage noted. The wound margin is distinct with the outline attached to the wound base. There is no granulation within the wound bed. There is no necrotic tissue within the wound bed. Wound #8 status is Open. Original cause of wound was Gradually Appeared. The wound is located on the Left Flank. The wound measures 1.9cm length x 2.1cm width x 0.3cm depth; 3.134cm^2 area and 0.94cm^3 volume. There is Fat Layer (Subcutaneous Tissue) Exposed exposed. There is no tunneling or undermining noted. There is a medium amount of serosanguineous drainage noted. The wound margin is well defined and not attached to the wound base. There is large (67-100%) red granulation within the wound bed. There is a small (1-33%) amount of necrotic tissue within the wound bed including Adherent Slough. Wound #9 status is Open. Original cause of wound was Gradually Appeared. The wound is located on the Left,Distal Breast. The wound measures 0.8cm length x 3cm width x 0.1cm depth; 1.885cm^2 area and  0.188cm^3 volume. There is Fat Layer (Subcutaneous Tissue) Exposed exposed. There is no tunneling or undermining noted. There is a medium amount of serosanguineous drainage noted. The wound margin is flat and intact. There is small (1-33%) pink granulation within the wound bed. There is a large (67-100%) amount of  necrotic tissue within the wound bed including Adherent Slough. Assessment Active Problems ICD-10 Disruption of external operation (surgical) wound, not elsewhere classified, subsequent encounter Unspecified open wound of abdominal wall, left lower quadrant without penetration into peritoneal cavity, subsequent encounter Non-pressure chronic ulcer of skin of other sites with other specified severity Lymphedema, not elsewhere classified Tinea corporis Plan Follow-up Appointments: Return appointment in 1 month. - ****ROOM 5 - STRETCHER**** Dressing Change Frequency: Other: - all wounds - change dressing twice a week. Skin Barriers/Peri-Wound Care: Skin Prep Wound Cleansing: May shower and wash wound with soap and water. Primary Wound Dressing: Wound #3 Right Abdomen - Lower Quadrant: Hydrofera Blue Wound #10 Left Breast: Calcium Alginate Wound #8 Left Flank: Calcium Alginate Wound #9 Left,Distal Breast: Calcium Alginate Secondary Dressing: Wound #10 Left Breast: Dry Gauze ABD pad - secure with tape Wound #3 Right Abdomen - Lower Quadrant: Dry Gauze ABD pad - secure with tape Wound #8 Left Flank: Dry Gauze ABD pad - secure with tape Wound #9 Left,Distal Breast: Dry Gauze ABD pad - secure with tape Off-Loading: Turn and reposition every 2 hours Other: - abdominal binder to support abdomen. Patient to float right heel while in chair or bed with pillow. Patient to keep pressure off right heel as much as possible. Roll up a face cloth or a pillowcase to separate all skin folds- under breast, abdomen, both flank areas. Additional Orders / Instructions: Other: - Hoyer  lift will need to be ordered by [place]: [facility] for wound care. - [facility] 1. I would recommend currently that we go ahead and initiate treatment with the Hydrofera Blue to the abdominal region. 2. We will use silver alginate to all other wounds at this time. 3. I am also can recommend the patient continue to have these dressings changed on a regular basis to keep things moist so that nothing worsens. We will see patient back for reevaluation in 1 month here in the clinic. If anything worsens or changes patient will contact our office for additional recommendations. Electronic Signature(s) Signed: 09/26/2019 11:25:49 AM By: Worthy Keeler PA-C Entered By: Worthy Keeler on 09/26/2019 11:25:49 -------------------------------------------------------------------------------- SuperBill Details Patient Name: Date of Service: [name] [name]. 09/26/2019 Medical Record Number: [id number] Patient Account Number: [id number] Date of Birth/Sex: Treating RN: 1955-04-09 (64 y.o. [name] Primary Care Provider: York Ram Other Clinician: Referring Provider: Treating Provider/Extender: Seymour Bars Weeks in Treatment: 117 Diagnosis Coding ICD-10 Codes Code Description T81.31XD Disruption of external operation (surgical) wound, not elsewhere classified, subsequent encounter S31.104D Unspecified open wound of abdominal wall, left lower quadrant without penetration into peritoneal cavity, subsequent encounter L98.498 Non-pressure chronic ulcer of skin of other sites with other specified severity I89.0 Lymphedema, not elsewhere classified B35.4 Tinea corporis Facility Procedures The patient participates with Medicare or their insurance follows the Medicare Facility Guidelines: CPT4 Code Description Modifier Quantity [id number] [phone] - WOUND CARE VISIT-LEV 5 EST PT 1 Physician Procedures : CPT4 Code Description Modifier  [id number] - WC PHYS LEVEL 3 - EST PT ICD-10 Diagnosis Description T81.31XD Disruption of external operation (surgical) wound, not elsewhere classified, subsequent encounter S31.104D Unspecified open wound of abdominal  wall, left lower quadrant without penetration into peritoneal cavit subsequent encounter L98.498 Non-pressure chronic ulcer of skin of other sites with other specified severity I89.0 Lymphedema, not elsewhere classified Quantity: 1 y, Engineer, maintenance) Signed: 09/26/2019 5:21:32 PM By: Levan Hurst RN, BSN Signed: 09/26/2019 6:02:08 PM By:  Worthy Keeler PA-C Previous Signature: 09/26/2019 11:26:07 AM Version By: Worthy Keeler PA-C Entered By: Levan Hurst on 09/26/2019 16:54:39

## 2019-10-07 ENCOUNTER — Emergency Department (HOSPITAL_COMMUNITY): Payer: No Typology Code available for payment source

## 2019-10-07 ENCOUNTER — Emergency Department (HOSPITAL_COMMUNITY)
Admission: EM | Admit: 2019-10-07 | Discharge: 2019-10-08 | Disposition: A | Discharge status: Home / Self Care | Payer: No Typology Code available for payment source | Attending: Emergency Medicine | Admitting: Emergency Medicine

## 2019-10-07 ENCOUNTER — Encounter (HOSPITAL_COMMUNITY): Payer: Self-pay

## 2019-10-07 DIAGNOSIS — R5381 Other malaise: Secondary | ICD-10-CM

## 2019-10-07 DIAGNOSIS — N184 Chronic kidney disease, stage 4 (severe): Secondary | ICD-10-CM | POA: Insufficient documentation

## 2019-10-07 DIAGNOSIS — I129 Hypertensive chronic kidney disease with stage 1 through stage 4 chronic kidney disease, or unspecified chronic kidney disease: Secondary | ICD-10-CM | POA: Insufficient documentation

## 2019-10-07 DIAGNOSIS — R5383 Other fatigue: Secondary | ICD-10-CM | POA: Diagnosis not present

## 2019-10-07 DIAGNOSIS — E11649 Type 2 diabetes mellitus with hypoglycemia without coma: Secondary | ICD-10-CM | POA: Diagnosis not present

## 2019-10-07 DIAGNOSIS — E1140 Type 2 diabetes mellitus with diabetic neuropathy, unspecified: Secondary | ICD-10-CM | POA: Insufficient documentation

## 2019-10-07 DIAGNOSIS — Z87891 Personal history of nicotine dependence: Secondary | ICD-10-CM | POA: Insufficient documentation

## 2019-10-07 DIAGNOSIS — Z7984 Long term (current) use of oral hypoglycemic drugs: Secondary | ICD-10-CM | POA: Diagnosis not present

## 2019-10-07 DIAGNOSIS — R112 Nausea with vomiting, unspecified: Secondary | ICD-10-CM | POA: Insufficient documentation

## 2019-10-07 DIAGNOSIS — N390 Urinary tract infection, site not specified: Secondary | ICD-10-CM | POA: Diagnosis present

## 2019-10-07 DIAGNOSIS — R110 Nausea: Secondary | ICD-10-CM

## 2019-10-07 LAB — CBC WITH DIFFERENTIAL/PLATELET
Abs Immature Granulocytes: 0.09 10*3/uL — ABNORMAL HIGH (ref 0.00–0.07)
Basophils Absolute: 0 10*3/uL (ref 0.0–0.1)
Basophils Relative: 0 %
Eosinophils Absolute: 0.2 10*3/uL (ref 0.0–0.5)
Eosinophils Relative: 2 %
HCT: 34.6 % — ABNORMAL LOW (ref 36.0–46.0)
Hemoglobin: 10.4 g/dL — ABNORMAL LOW (ref 12.0–15.0)
Immature Granulocytes: 1 %
Lymphocytes Relative: 24 %
Lymphs Abs: 2.6 10*3/uL (ref 0.7–4.0)
MCH: 25.8 pg — ABNORMAL LOW (ref 26.0–34.0)
MCHC: 30.1 g/dL (ref 30.0–36.0)
MCV: 85.9 fL (ref 80.0–100.0)
Monocytes Absolute: 1.1 10*3/uL — ABNORMAL HIGH (ref 0.1–1.0)
Monocytes Relative: 10 %
Neutro Abs: 6.6 10*3/uL (ref 1.7–7.7)
Neutrophils Relative %: 63 %
Platelets: 655 10*3/uL — ABNORMAL HIGH (ref 150–400)
RBC: 4.03 MIL/uL (ref 3.87–5.11)
RDW: 16.7 % — ABNORMAL HIGH (ref 11.5–15.5)
WBC: 10.6 10*3/uL — ABNORMAL HIGH (ref 4.0–10.5)
nRBC: 0 % (ref 0.0–0.2)

## 2019-10-07 LAB — BASIC METABOLIC PANEL
Anion gap: 16 — ABNORMAL HIGH (ref 5–15)
BUN: 50 mg/dL — ABNORMAL HIGH (ref 8–23)
CO2: 24 mmol/L (ref 22–32)
Calcium: 10 mg/dL (ref 8.9–10.3)
Chloride: 98 mmol/L (ref 98–111)
Creatinine, Ser: 2.45 mg/dL — ABNORMAL HIGH (ref 0.44–1.00)
GFR calc Af Amer: 23 mL/min — ABNORMAL LOW (ref >60)
GFR calc non Af Amer: 20 mL/min — ABNORMAL LOW (ref >60)
Glucose, Bld: 113 mg/dL — ABNORMAL HIGH (ref 70–99)
Potassium: 4.1 mmol/L (ref 3.5–5.1)
Sodium: 138 mmol/L (ref 135–145)

## 2019-10-07 LAB — URINALYSIS, ROUTINE W REFLEX MICROSCOPIC
Bilirubin Urine: NEGATIVE
Glucose, UA: NEGATIVE mg/dL
Hgb urine dipstick: NEGATIVE
Ketones, ur: 20 mg/dL — AB
Leukocytes,Ua: NEGATIVE
Nitrite: NEGATIVE
Protein, ur: NEGATIVE mg/dL
Specific Gravity, Urine: 1.013 (ref 1.005–1.030)
pH: 5 (ref 5.0–8.0)

## 2019-10-07 LAB — LACTIC ACID, PLASMA: Lactic Acid, Venous: 1.1 mmol/L (ref 0.5–1.9)

## 2019-10-07 MED ORDER — ONDANSETRON 4 MG PO TBDP
4.0000 mg | ORAL_TABLET | Freq: Three times a day (TID) | ORAL | 0 refills | Status: DC | PRN
Start: 2019-10-07 — End: 2019-10-20

## 2019-10-07 MED ORDER — ONDANSETRON HCL 4 MG/2ML IJ SOLN: 4.0000 mg | Freq: Once | INTRAMUSCULAR | Status: DC

## 2019-10-07 MED ORDER — ONDANSETRON 4 MG PO TBDP
4.0000 mg | ORAL_TABLET | Freq: Once | ORAL | Status: AC
  Administered 2019-10-07: 4 mg via ORAL
  Filled 2019-10-07: qty 1

## 2019-10-07 NOTE — Discharge Instructions (Signed)
Your work-up today was similar to prior but we did not find convincing evidence of urinary tract infection as you suspected.  Your x-ray did not show pneumonia and your labs were similar to prior.  Your kidney function was better than it was several months ago but is slightly elevated.  Please try to use the nausea medicine to maintain your hydration as we discussed.  Please follow-up with your primary doctor for further evaluation of your kidney function.  Your white blood cell count was also slightly elevated today and may be related to a viral infection.  Given your history of stomach problems in the past, we do agree with you with GI follow-up being recommended.  Please call to set up an appointment.  Please push hydration for the next few days and rest.  If any symptoms change or worsen, please return to the nearest emergency department.  Please follow-up with your wound care team as well.

## 2019-10-07 NOTE — ED Triage Notes (Addendum)
Pt BIB [facility] for eval of UTI like sx. Dysuria and foul smelling urine x 4 days. Pt also w/ 3 known wounds that a home health RN comes and cares for wounds. Pt slightly tachycardic w/ EMS, foul odor noted. EMS reports HHRN told them pt was largely non compliant w/ medications and home CPAP
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
## 2019-10-07 NOTE — ED Provider Notes (Signed)
10:18 PM Care assumed from [facility].  At time of transfer care, patient is waiting for lactic acid and chest x-ray results.  She who was brought in for concern for possible urinary tract infection with foul-smelling urine and some dysuria in the setting of chills, shortness of breath, and malaise.  Patient also has some chronic wounds that the PA team assessed and did not feel demonstrated worsening cellulitis or infection at this time.  Chest x-ray returned showing some atelectasis without any evidence of acute or active cardiopulmonary disease.  Specific, no evidence of infiltrate.  Urinalysis did not show infection but a culture was sent.  Patient's creatinine was more elevated than last time but it was similarly elevated and worse several months ago.  CBC showed mild leukocytosis and mild anemia.  Lactic acid is still in process.  If lactic acid is reassuring and patient still appears well, dissipate discharge home to follow-up on the urine culture and follow-up with PCP.  11:11 PM Lactic acid is normal.  It does not elevated.  Do not feel patient is septic or has serious bacterial infection ongoing at this time.  She is afebrile.  I went had a long discussion with the patient.  Patient is concerned about mild dehydration given her nausea.  She did well with Zofran and will be given a prescription for Zofran ODT.  We did not see pneumonia on the x-ray.  Patient will follow up with her PCP for further management.  She also was understood and a GI contact information given the chronic GI symptoms.  She will be given the number for this.  She will push hydration at home and follow-up with her PCP and her wound care team.  Patient agreed with plan of care and was discharged in good condition with reassuring reassessment.    Clinical Impression: 1. Nausea   2. Malaise and fatigue     Disposition: Discharge  Condition: Good  I have discussed the results, Dx and Tx plan with the  pt(& family if present). He/she/they expressed understanding and agree(s) with the plan. Discharge instructions discussed at great length. Strict return precautions discussed and pt &/or family have verbalized understanding of the instructions. No further questions at time of discharge.    New Prescriptions   ONDANSETRON (ZOFRAN ODT) 4 MG DISINTEGRATING TABLET    Take 1 tablet (4 mg total) by mouth every 8 (eight) hours as needed for nausea or vomiting.    Follow Up: [name], MD [place] [address] [place] [address] [id number] [place] [phone]    [facility] [place] [address] [phone]       [place] Olkowski, Gwenyth Allegra, MD 10/07/19 [phone]

## 2019-10-07 NOTE — ED Notes (Signed)
Called [facility] for transport home--Dezire Turk 

## 2019-10-08 LAB — URINE CULTURE: Culture: NO GROWTH

## 2019-10-18 ENCOUNTER — Other Ambulatory Visit: Payer: Self-pay

## 2019-10-18 ENCOUNTER — Emergency Department (HOSPITAL_COMMUNITY): Payer: No Typology Code available for payment source

## 2019-10-18 ENCOUNTER — Observation Stay (HOSPITAL_COMMUNITY)
Admission: EM | Admit: 2019-10-18 | Discharge: 2019-10-20 | Disposition: A | Discharge status: Home / Self Care | Payer: No Typology Code available for payment source | Attending: Family Medicine | Admitting: Family Medicine

## 2019-10-18 ENCOUNTER — Encounter (HOSPITAL_COMMUNITY): Payer: Self-pay

## 2019-10-18 DIAGNOSIS — Z20822 Contact with and (suspected) exposure to covid-19: Secondary | ICD-10-CM | POA: Insufficient documentation

## 2019-10-18 DIAGNOSIS — I10 Essential (primary) hypertension: Secondary | ICD-10-CM | POA: Diagnosis present

## 2019-10-18 DIAGNOSIS — N184 Chronic kidney disease, stage 4 (severe): Secondary | ICD-10-CM | POA: Insufficient documentation

## 2019-10-18 DIAGNOSIS — Z87891 Personal history of nicotine dependence: Secondary | ICD-10-CM | POA: Insufficient documentation

## 2019-10-18 DIAGNOSIS — Z7982 Long term (current) use of aspirin: Secondary | ICD-10-CM | POA: Diagnosis not present

## 2019-10-18 DIAGNOSIS — Z7984 Long term (current) use of oral hypoglycemic drugs: Secondary | ICD-10-CM | POA: Diagnosis not present

## 2019-10-18 DIAGNOSIS — R112 Nausea with vomiting, unspecified: Principal | ICD-10-CM | POA: Insufficient documentation

## 2019-10-18 DIAGNOSIS — E1122 Type 2 diabetes mellitus with diabetic chronic kidney disease: Secondary | ICD-10-CM | POA: Insufficient documentation

## 2019-10-18 DIAGNOSIS — K922 Gastrointestinal hemorrhage, unspecified: Secondary | ICD-10-CM | POA: Diagnosis not present

## 2019-10-18 DIAGNOSIS — Z6841 Body Mass Index (BMI) 40.0 and over, adult: Secondary | ICD-10-CM | POA: Diagnosis not present

## 2019-10-18 DIAGNOSIS — N183 Chronic kidney disease, stage 3 unspecified: Secondary | ICD-10-CM | POA: Diagnosis present

## 2019-10-18 DIAGNOSIS — Z79899 Other long term (current) drug therapy: Secondary | ICD-10-CM | POA: Diagnosis not present

## 2019-10-18 DIAGNOSIS — E1140 Type 2 diabetes mellitus with diabetic neuropathy, unspecified: Secondary | ICD-10-CM | POA: Insufficient documentation

## 2019-10-18 DIAGNOSIS — Z23 Encounter for immunization: Secondary | ICD-10-CM | POA: Diagnosis not present

## 2019-10-18 DIAGNOSIS — I129 Hypertensive chronic kidney disease with stage 1 through stage 4 chronic kidney disease, or unspecified chronic kidney disease: Secondary | ICD-10-CM | POA: Insufficient documentation

## 2019-10-18 DIAGNOSIS — E1165 Type 2 diabetes mellitus with hyperglycemia: Secondary | ICD-10-CM | POA: Diagnosis not present

## 2019-10-18 DIAGNOSIS — E119 Type 2 diabetes mellitus without complications: Secondary | ICD-10-CM | POA: Diagnosis present

## 2019-10-18 LAB — CBC WITH DIFFERENTIAL/PLATELET
Abs Immature Granulocytes: 0.07 10*3/uL (ref 0.00–0.07)
Basophils Absolute: 0.1 10*3/uL (ref 0.0–0.1)
Basophils Relative: 1 %
Eosinophils Absolute: 0.3 10*3/uL (ref 0.0–0.5)
Eosinophils Relative: 3 %
HCT: 40.6 % (ref 36.0–46.0)
Hemoglobin: 11.9 g/dL — ABNORMAL LOW (ref 12.0–15.0)
Immature Granulocytes: 1 %
Lymphocytes Relative: 23 %
Lymphs Abs: 1.7 10*3/uL (ref 0.7–4.0)
MCH: 25.4 pg — ABNORMAL LOW (ref 26.0–34.0)
MCHC: 29.3 g/dL — ABNORMAL LOW (ref 30.0–36.0)
MCV: 86.8 fL (ref 80.0–100.0)
Monocytes Absolute: 1.2 10*3/uL — ABNORMAL HIGH (ref 0.1–1.0)
Monocytes Relative: 17 %
Neutro Abs: 4.1 10*3/uL (ref 1.7–7.7)
Neutrophils Relative %: 55 %
Platelets: 480 10*3/uL — ABNORMAL HIGH (ref 150–400)
RBC: 4.68 MIL/uL (ref 3.87–5.11)
RDW: 17 % — ABNORMAL HIGH (ref 11.5–15.5)
WBC: 7.4 10*3/uL (ref 4.0–10.5)
nRBC: 0 % (ref 0.0–0.2)

## 2019-10-18 LAB — COMPREHENSIVE METABOLIC PANEL
ALT: 29 U/L (ref 0–44)
AST: 25 U/L (ref 15–41)
Albumin: 3.9 g/dL (ref 3.5–5.0)
Alkaline Phosphatase: 123 U/L (ref 38–126)
Anion gap: 15 (ref 5–15)
BUN: 34 mg/dL — ABNORMAL HIGH (ref 8–23)
CO2: 28 mmol/L (ref 22–32)
Calcium: 9.9 mg/dL (ref 8.9–10.3)
Chloride: 95 mmol/L — ABNORMAL LOW (ref 98–111)
Creatinine, Ser: 1.95 mg/dL — ABNORMAL HIGH (ref 0.44–1.00)
GFR calc Af Amer: 30 mL/min — ABNORMAL LOW (ref >60)
GFR calc non Af Amer: 26 mL/min — ABNORMAL LOW (ref >60)
Glucose, Bld: 166 mg/dL — ABNORMAL HIGH (ref 70–99)
Potassium: 3.2 mmol/L — ABNORMAL LOW (ref 3.5–5.1)
Sodium: 138 mmol/L (ref 135–145)
Total Bilirubin: 0.7 mg/dL (ref 0.3–1.2)
Total Protein: 7.7 g/dL (ref 6.5–8.1)

## 2019-10-18 LAB — LIPASE, BLOOD: Lipase: 71 U/L — ABNORMAL HIGH (ref 11–51)

## 2019-10-18 MED ORDER — LACTATED RINGERS IV BOLUS
1000.0000 mL | Freq: Once | INTRAVENOUS | Status: AC
  Administered 2019-10-18: 1000 mL via INTRAVENOUS

## 2019-10-18 MED ORDER — METOCLOPRAMIDE HCL 5 MG/ML IJ SOLN
10.0000 mg | Freq: Once | INTRAMUSCULAR | Status: AC
  Administered 2019-10-18: 10 mg via INTRAVENOUS
  Filled 2019-10-18: qty 2

## 2019-10-18 NOTE — ED Notes (Signed)
Save blue tube in main lab °

## 2019-10-18 NOTE — ED Triage Notes (Signed)
Pt arrives [facility] for c/o nausea and vomiting x 3 weeks. States she was evaluated at [facility] and the zofran has made things worse.
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
## 2019-10-19 ENCOUNTER — Encounter (HOSPITAL_COMMUNITY): Payer: Self-pay | Admitting: Internal Medicine

## 2019-10-19 DIAGNOSIS — K922 Gastrointestinal hemorrhage, unspecified: Secondary | ICD-10-CM | POA: Diagnosis not present

## 2019-10-19 DIAGNOSIS — E11649 Type 2 diabetes mellitus with hypoglycemia without coma: Secondary | ICD-10-CM | POA: Diagnosis not present

## 2019-10-19 DIAGNOSIS — R112 Nausea with vomiting, unspecified: Secondary | ICD-10-CM | POA: Diagnosis not present

## 2019-10-19 LAB — COMPREHENSIVE METABOLIC PANEL
ALT: 28 U/L (ref 0–44)
AST: 26 U/L (ref 15–41)
Albumin: 4 g/dL (ref 3.5–5.0)
Alkaline Phosphatase: 125 U/L (ref 38–126)
Anion gap: 18 — ABNORMAL HIGH (ref 5–15)
BUN: 36 mg/dL — ABNORMAL HIGH (ref 8–23)
CO2: 27 mmol/L (ref 22–32)
Calcium: 10.2 mg/dL (ref 8.9–10.3)
Chloride: 94 mmol/L — ABNORMAL LOW (ref 98–111)
Creatinine, Ser: 2.07 mg/dL — ABNORMAL HIGH (ref 0.44–1.00)
GFR calc Af Amer: 28 mL/min — ABNORMAL LOW (ref >60)
GFR calc non Af Amer: 24 mL/min — ABNORMAL LOW (ref >60)
Glucose, Bld: 163 mg/dL — ABNORMAL HIGH (ref 70–99)
Potassium: 3 mmol/L — ABNORMAL LOW (ref 3.5–5.1)
Sodium: 139 mmol/L (ref 135–145)
Total Bilirubin: 0.9 mg/dL (ref 0.3–1.2)
Total Protein: 7.7 g/dL (ref 6.5–8.1)

## 2019-10-19 LAB — GLUCOSE, CAPILLARY
Glucose-Capillary: 167 mg/dL — ABNORMAL HIGH (ref 70–99)
Glucose-Capillary: 161 mg/dL — ABNORMAL HIGH (ref 70–99)
Glucose-Capillary: 160 mg/dL — ABNORMAL HIGH (ref 70–99)
Glucose-Capillary: 198 mg/dL — ABNORMAL HIGH (ref 70–99)
Glucose-Capillary: 223 mg/dL — ABNORMAL HIGH (ref 70–99)

## 2019-10-19 LAB — URINALYSIS, ROUTINE W REFLEX MICROSCOPIC
Bilirubin Urine: NEGATIVE
Glucose, UA: NEGATIVE mg/dL
Ketones, ur: 5 mg/dL — AB
Nitrite: NEGATIVE
Protein, ur: 30 mg/dL — AB
Specific Gravity, Urine: 1.017 (ref 1.005–1.030)
WBC, UA: >50 WBC/hpf — ABNORMAL HIGH (ref 0–5)
pH: 5 (ref 5.0–8.0)

## 2019-10-19 LAB — CBC
HCT: 39.9 % (ref 36.0–46.0)
HCT: 37.8 % (ref 36.0–46.0)
Hemoglobin: 12 g/dL (ref 12.0–15.0)
Hemoglobin: 11.4 g/dL — ABNORMAL LOW (ref 12.0–15.0)
MCH: 25.5 pg — ABNORMAL LOW (ref 26.0–34.0)
MCH: 25.7 pg — ABNORMAL LOW (ref 26.0–34.0)
MCHC: 30.1 g/dL (ref 30.0–36.0)
MCHC: 30.2 g/dL (ref 30.0–36.0)
MCV: 84.7 fL (ref 80.0–100.0)
MCV: 85.1 fL (ref 80.0–100.0)
Platelets: 486 10*3/uL — ABNORMAL HIGH (ref 150–400)
Platelets: 480 10*3/uL — ABNORMAL HIGH (ref 150–400)
RBC: 4.71 MIL/uL (ref 3.87–5.11)
RBC: 4.44 MIL/uL (ref 3.87–5.11)
RDW: 16.7 % — ABNORMAL HIGH (ref 11.5–15.5)
RDW: 16.7 % — ABNORMAL HIGH (ref 11.5–15.5)
WBC: 7.4 10*3/uL (ref 4.0–10.5)
WBC: 6.9 10*3/uL (ref 4.0–10.5)
nRBC: 0 % (ref 0.0–0.2)
nRBC: 0 % (ref 0.0–0.2)

## 2019-10-19 LAB — HIV ANTIBODY (ROUTINE TESTING W REFLEX): HIV Screen 4th Generation wRfx: NONREACTIVE

## 2019-10-19 LAB — SARS CORONAVIRUS 2 BY RT PCR (HOSPITAL ORDER, PERFORMED IN ~~LOC~~ HOSPITAL LAB): SARS Coronavirus 2: NEGATIVE

## 2019-10-19 LAB — TYPE AND SCREEN
ABO/RH(D): B POS
Antibody Screen: NEGATIVE

## 2019-10-19 LAB — POC OCCULT BLOOD, ED: Fecal Occult Bld: POSITIVE — AB

## 2019-10-19 LAB — ABO/RH: ABO/RH(D): B POS

## 2019-10-19 MED ORDER — ONDANSETRON HCL 4 MG PO TABS: 4.0000 mg | ORAL_TABLET | Freq: Four times a day (QID) | ORAL | Status: DC | PRN

## 2019-10-19 MED ORDER — PNEUMOCOCCAL VAC POLYVALENT 25 MCG/0.5ML IJ INJ
0.5000 mL | INJECTION | INTRAMUSCULAR | Status: AC
  Administered 2019-10-20: 0.5 mL via INTRAMUSCULAR
  Filled 2019-10-19: qty 0.5

## 2019-10-19 MED ORDER — OXYCODONE-ACETAMINOPHEN 10-325 MG PO TABS
1.0000 | ORAL_TABLET | Freq: Three times a day (TID) | ORAL | Status: DC | PRN
Start: 2019-10-19 — End: 2019-10-19

## 2019-10-19 MED ORDER — SORBITOL 70 % SOLN
30.0000 mL | Freq: Two times a day (BID) | Status: DC
  Administered 2019-10-19 (×2): 30 mL via ORAL
  Filled 2019-10-19 (×4): qty 30

## 2019-10-19 MED ORDER — ALLOPURINOL 100 MG PO TABS
100.0000 mg | ORAL_TABLET | Freq: Two times a day (BID) | ORAL | Status: DC
  Administered 2019-10-20: 100 mg via ORAL
  Filled 2019-10-19 (×2): qty 1

## 2019-10-19 MED ORDER — PANTOPRAZOLE SODIUM 40 MG PO TBEC
40.0000 mg | DELAYED_RELEASE_TABLET | Freq: Every day | ORAL | Status: DC
  Administered 2019-10-20: 40 mg via ORAL
  Filled 2019-10-19: qty 1

## 2019-10-19 MED ORDER — OXYCODONE-ACETAMINOPHEN 5-325 MG PO TABS
1.0000 | ORAL_TABLET | Freq: Two times a day (BID) | ORAL | Status: DC
  Administered 2019-10-19 – 2019-10-20 (×3): 1 via ORAL
  Filled 2019-10-19 (×3): qty 1

## 2019-10-19 MED ORDER — POTASSIUM CHLORIDE CRYS ER 20 MEQ PO TBCR
40.0000 meq | EXTENDED_RELEASE_TABLET | Freq: Every day | ORAL | Status: DC
  Administered 2019-10-19 – 2019-10-20 (×2): 40 meq via ORAL
  Filled 2019-10-19 (×2): qty 2

## 2019-10-19 MED ORDER — INSULIN ASPART 100 UNIT/ML ~~LOC~~ SOLN
0.0000 [IU] | SUBCUTANEOUS | Status: DC
  Administered 2019-10-19: 1 [IU] via SUBCUTANEOUS
  Administered 2019-10-19: 21:00:00 2 [IU] via SUBCUTANEOUS
  Administered 2019-10-19 – 2019-10-20 (×2): 1 [IU] via SUBCUTANEOUS
  Administered 2019-10-20: 5 [IU] via SUBCUTANEOUS
  Administered 2019-10-20: 2 [IU] via SUBCUTANEOUS

## 2019-10-19 MED ORDER — POTASSIUM CHLORIDE 2 MEQ/ML IV SOLN: INTRAVENOUS | Status: DC

## 2019-10-19 MED ORDER — INSULIN ASPART 100 UNIT/ML ~~LOC~~ SOLN
0.0000 [IU] | SUBCUTANEOUS | Status: DC
  Administered 2019-10-19: 10:00:00 1 [IU] via SUBCUTANEOUS

## 2019-10-19 MED ORDER — FLEET ENEMA 7-19 GM/118ML RE ENEM: 1.0000 | ENEMA | Freq: Once | RECTAL | Status: DC

## 2019-10-19 MED ORDER — PANTOPRAZOLE SODIUM 40 MG IV SOLR
40.0000 mg | INTRAVENOUS | Status: DC
  Administered 2019-10-19: 11:00:00 40 mg via INTRAVENOUS
  Filled 2019-10-19: qty 40

## 2019-10-19 MED ORDER — GABAPENTIN 300 MG PO CAPS
600.0000 mg | ORAL_CAPSULE | Freq: Three times a day (TID) | ORAL | Status: DC
  Administered 2019-10-19 – 2019-10-20 (×4): 600 mg via ORAL
  Filled 2019-10-19 (×4): qty 2

## 2019-10-19 MED ORDER — ALPRAZOLAM 0.5 MG PO TABS
1.0000 mg | ORAL_TABLET | Freq: Three times a day (TID) | ORAL | Status: DC | PRN
  Administered 2019-10-19: 21:00:00 1 mg via ORAL
  Filled 2019-10-19: qty 2

## 2019-10-19 MED ORDER — KCL IN DEXTROSE-NACL 40-5-0.9 MEQ/L-%-% IV SOLN
INTRAVENOUS | Status: DC
  Filled 2019-10-19: qty 1000

## 2019-10-19 MED ORDER — DULOXETINE HCL 30 MG PO CPEP
30.0000 mg | ORAL_CAPSULE | Freq: Two times a day (BID) | ORAL | Status: DC
  Administered 2019-10-19 – 2019-10-20 (×2): 30 mg via ORAL
  Filled 2019-10-19 (×2): qty 1

## 2019-10-19 MED ORDER — MIRTAZAPINE 30 MG PO TABS: 30.0000 mg | ORAL_TABLET | Freq: Every day | ORAL | Status: DC

## 2019-10-19 MED ORDER — HYDROXYZINE HCL 25 MG PO TABS: 50.0000 mg | ORAL_TABLET | Freq: Three times a day (TID) | ORAL | Status: DC | PRN

## 2019-10-19 MED ORDER — ONDANSETRON HCL 4 MG/2ML IJ SOLN: 4.0000 mg | Freq: Four times a day (QID) | INTRAMUSCULAR | Status: DC | PRN

## 2019-10-19 MED ORDER — OXYCODONE HCL 5 MG PO TABS: 5.0000 mg | ORAL_TABLET | Freq: Three times a day (TID) | ORAL | Status: DC | PRN

## 2019-10-19 MED ORDER — OXYCODONE-ACETAMINOPHEN 5-325 MG PO TABS: 1.0000 | ORAL_TABLET | Freq: Three times a day (TID) | ORAL | Status: DC | PRN

## 2019-10-19 NOTE — Progress Notes (Signed)
Pt not ready for CPAP at this time due to taking laxative.  Pt states she will notify staff if she decides to wear, machine at bedside.  RT to monitor and assess as needed.
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
## 2019-10-19 NOTE — Progress Notes (Signed)
Dr. Verlon Au aware, on rounds to see pt, that pt lost IV access. See new orders entered by MD.

## 2019-10-19 NOTE — Progress Notes (Signed)
PROGRESS NOTE    [name] [name]  ZOX:[id number] DOB: 1955-03-25 DOA: 10/18/2019 PCP: [name], MD  Brief Narrative:  41 morbidly obese black female BMI 60 Sleep apnea DM TY 2 with retinopathy as well as CKD 4 from this HLD HTN Polysubstance abuse previously-previously drank 20 ounces of alcohol per week with prior admissions for metabolic encephalopathy-noted in the past to be on Percocet Flexeril Xanax Ambien Ativan for history of probable depression anxiety and PTSD She also has had several admissions for pannus cellulitis multiple areas of drainage ulcers and hyperkalemia  Recent admission 08/20/2019 with acute toxic metabolic encephalopathy status post ingestion large quantity opioid pain medications in addition to then having several falls She had a hypercarbic respiratory acidosis that improved with Narcan on that admission and had a panic colitis and was treated with doxycycline at that time Again at this admission she was recommended to go to skilled facility but preferred to go home with home health services  Represented to the emergency room 8/16 with nausea vomiting and was sent home as no acute source was found and she was referred to [place] [facility]   Re presented once again 10/18/2019 with abdominal pain nausea vomiting CT abdomen showing features of possible diverticular bleed vs constipation Occult blood was positive Hemoglobin however baseline is 10 and on admission was 11.9 BUNs/creatinine 31/1.3  Assessment & Plan:   Principal Problem:   Nausea & vomiting Active Problems:   Diabetes type 2, uncontrolled (HCC)   CKD (chronic kidney disease) stage 4, GFR 15-29 ml/min (HCC)   Essential hypertension   Morbid obesity ([place])   GI bleeding   1. ?  Diverticular bleed with nausea vomiting--differential on CT scan shows high attenuating fecal matter a. Hemoccult is positive however there is no current bleeding and patient claims to have not had a  stool in "6 weeks"?? b. Given that blood is a cathartic, her hemoglobin is actually higher than it was at last ED visit and the fact that she is tolerating clear liquids, I will start patient on sorbitol c. If this is not effective patient will need a fleets enema and or movi-prep or GoLYTELY to clean her out d. We will consider GI input however this is unlikely to be a GI bleed 2. Hypokalemia a. Replace orally with K. Dur b. She has no IV access currently and I think we can hold off for the time being 3. CKD 3-4 in a setting of diabetes mellitus  a. anion gap acidosis i. This is variable she has a GFR of 24 b. Monitor trends of fluid input and labs in the morning 4. ?  Pancreatitis unlikely etiology a. She claims to have had nausea and vomiting she has not had any since this morning and/or yesterday b. We will need to reevaluate in the morning c. If she has nausea she will need IV fluids and a a midline placed as she has poor access 5. Left knee fracture? a. Appears to have occurred in the past month or so? b. Patient has consistently declined going to a skilled facility and has caregivers at home c. Twice daily scheduled a prior history of have been 6. BMI 60 7. Prior polysubstance abuse 8. Recent admission panniculitis a. She has multiple areas in her abdomen and one area under her left breast that have clean but open wounds b. These will unlikely heal well given her poor underlying health and morbid obesity and diabetes-she will need outpatient wound management if  she is able  DVT prophylaxis: SCD Code Status: Full Family Communication: None present Disposition: Inpatient  Status is: Observation  The patient will require care spanning > 2 midnights and should be moved to inpatient because: Persistent severe electrolyte disturbances, Ongoing diagnostic testing needed not appropriate for outpatient work up, Unsafe d/c plan and IV treatments appropriate due to intensity of illness or  inability to take PO  Dispo: The patient is from: Home              Anticipated d/c is to: Home              Anticipated d/c date is: 2 days              Patient currently is medically stable to d/c.       Consultants:   None yet  Procedures: None  Antimicrobials: None awake obese no distress tolerated some liquids    Subjective: I asked her to multiple different ways if she has issues with any specific type of consistency of food sensation of globus or chest pain with eating and she denies the same She seems to have a quite flat affect She tells me she has not had a stool in 6 weeks?  She also tells me she has not had bleeding and nursing reports that her bleeding was actually more than 4 weeks ago  Objective: Vitals:   10/19/19 0615 10/19/19 0630 10/19/19 0700 10/19/19 0715  BP: 131/73 108/77 120/84 136/60  Pulse: (!) 103 (!) 108 (!) 108 (!) 104  Resp: 19 20 (!) 28 (!) 28  Temp:      TempSrc:      SpO2: 93% 98% 98% 96%  Weight:      Height:       No intake or output data in the 24 hours ending 10/19/19 0736 Filed Weights   10/18/19 2006  Weight: (!) 150 kg    Examination:  General exam: Awake coherent no distress Respiratory system: Clear no added sound Cardiovascular system: S1-S2 limited by habitus Gastrointestinal system: Limited exam based on obese habitus. Central nervous system: Intact moving 4 limbs equally Extremities: ROM intact Skin: Briefly examine wound under left breast and wound and groin which seems clean and not being dressed with wet-to-dry dressings-she has multiple areas on her skin that are excoriated with rashes and healed scars Psychiatry: Flat affect  Data Reviewed: I have personally reviewed following labs and imaging studies Potassium 3.0 BUN/creatinine 36/2.0 Anion gap 18 LFTs normal Hemoglobin 11.4 platelet 480  Radiology Studies: CT ABDOMEN PELVIS WO CONTRAST  Result Date: 10/19/2019 CLINICAL DATA:  64 year old female with  nausea and vomiting. EXAM: CT ABDOMEN AND PELVIS WITHOUT CONTRAST TECHNIQUE: Multidetector CT imaging of the abdomen and pelvis was performed following the standard protocol without IV contrast. COMPARISON:  None. FINDINGS: Evaluation of this exam is limited in the absence of intravenous contrast. Lower chest: The visualized lung bases are clear. No intra-abdominal free air or free fluid. Hepatobiliary: No focal liver abnormality is seen. No gallstones, gallbladder wall thickening, or biliary dilatation. Pancreas: Unremarkable. No pancreatic ductal dilatation or surrounding inflammatory changes. Spleen: Normal in size without focal abnormality. Adrenals/Urinary Tract: The adrenal glands unremarkable. There is no hydronephrosis or nephrolithiasis on either side. There is a small focus of cortical calcification in the superior pole of the left kidney. Subcentimeter right renal inferior pole hypodense focus is too small to characterize. The visualized ureters and urinary bladder appear unremarkable. Stomach/Bowel: There are scattered colonic diverticula  without active inflammatory changes. There is high attenuating content within the cecum and ascending colon which may be related to high attenuating fecal matter but concerning for diverticular bleed. Clinical correlation is recommended. There is no bowel obstruction. No evidence of acute appendicitis. Vascular/Lymphatic: Moderate aortoiliac atherosclerotic disease. The IVC is unremarkable. No portal venous gas. There is no adenopathy. Reproductive: Hysterectomy. Other: Anterior pelvic wall postsurgical scarring. Musculoskeletal: Degenerative changes of the spine. No acute osseous pathology. IMPRESSION: 1. Colonic diverticulosis with concern for diverticular bleed in the proximal colon. Clinical correlation is recommended. No bowel obstruction. 2. Aortic Atherosclerosis (ICD10-I70.0). Electronically Signed   By: Anner Crete M.D.   On: 10/19/2019 00:31      Scheduled Meds: . [START ON 10/20/2019] allopurinol  100 mg Oral BID  . DULoxetine  30 mg Oral BID  . gabapentin  600 mg Oral TID  . insulin aspart  0-6 Units Subcutaneous Q4H  . oxyCODONE-acetaminophen  1 tablet Oral BID  . [START ON 10/20/2019] pantoprazole  40 mg Oral Daily  . [START ON 10/20/2019] pneumococcal 23 valent vaccine  0.5 mL Intramuscular Tomorrow-1000  . potassium chloride  40 mEq Oral Daily  . [START ON 10/20/2019] sodium phosphate  1 enema Rectal Once  . sorbitol  30 mL Oral BID   Continuous Infusions:   LOS: 0 days    Time spent: [place], MD [facility] To contact the attending provider between 7A-7P or the covering provider during after hours 7P-7A, please log into the web site [email or web address] and access using universal [place] password for that web site. If you do not have the password, please call the hospital operator.  10/19/2019, 7:36 AM

## 2019-10-19 NOTE — Progress Notes (Signed)

## 2019-10-20 DIAGNOSIS — R112 Nausea with vomiting, unspecified: Secondary | ICD-10-CM | POA: Diagnosis not present

## 2019-10-20 LAB — GLUCOSE, CAPILLARY
Glucose-Capillary: 146 mg/dL — ABNORMAL HIGH (ref 70–99)
Glucose-Capillary: 150 mg/dL — ABNORMAL HIGH (ref 70–99)
Glucose-Capillary: 173 mg/dL — ABNORMAL HIGH (ref 70–99)
Glucose-Capillary: 215 mg/dL — ABNORMAL HIGH (ref 70–99)
Glucose-Capillary: 367 mg/dL — ABNORMAL HIGH (ref 70–99)

## 2019-10-20 LAB — CBC WITH DIFFERENTIAL/PLATELET
Abs Immature Granulocytes: 0.02 10*3/uL (ref 0.00–0.07)
Basophils Absolute: 0.1 10*3/uL (ref 0.0–0.1)
Basophils Relative: 1 %
Eosinophils Absolute: 0.2 10*3/uL (ref 0.0–0.5)
Eosinophils Relative: 5 %
HCT: 37.1 % (ref 36.0–46.0)
Hemoglobin: 11 g/dL — ABNORMAL LOW (ref 12.0–15.0)
Immature Granulocytes: 0 %
Lymphocytes Relative: 33 %
Lymphs Abs: 1.6 10*3/uL (ref 0.7–4.0)
MCH: 25.5 pg — ABNORMAL LOW (ref 26.0–34.0)
MCHC: 29.6 g/dL — ABNORMAL LOW (ref 30.0–36.0)
MCV: 86.1 fL (ref 80.0–100.0)
Monocytes Absolute: 0.5 10*3/uL (ref 0.1–1.0)
Monocytes Relative: 9 %
Neutro Abs: 2.6 10*3/uL (ref 1.7–7.7)
Neutrophils Relative %: 52 %
Platelets: 460 10*3/uL — ABNORMAL HIGH (ref 150–400)
RBC: 4.31 MIL/uL (ref 3.87–5.11)
RDW: 16.6 % — ABNORMAL HIGH (ref 11.5–15.5)
WBC: 5 10*3/uL (ref 4.0–10.5)
nRBC: 0 % (ref 0.0–0.2)

## 2019-10-20 LAB — COMPREHENSIVE METABOLIC PANEL
ALT: 31 U/L (ref 0–44)
AST: 27 U/L (ref 15–41)
Albumin: 3.4 g/dL — ABNORMAL LOW (ref 3.5–5.0)
Alkaline Phosphatase: 112 U/L (ref 38–126)
Anion gap: 12 (ref 5–15)
BUN: 36 mg/dL — ABNORMAL HIGH (ref 8–23)
CO2: 27 mmol/L (ref 22–32)
Calcium: 9.4 mg/dL (ref 8.9–10.3)
Chloride: 100 mmol/L (ref 98–111)
Creatinine, Ser: 2.81 mg/dL — ABNORMAL HIGH (ref 0.44–1.00)
GFR calc Af Amer: 19 mL/min — ABNORMAL LOW (ref >60)
GFR calc non Af Amer: 17 mL/min — ABNORMAL LOW (ref >60)
Glucose, Bld: 149 mg/dL — ABNORMAL HIGH (ref 70–99)
Potassium: 3.1 mmol/L — ABNORMAL LOW (ref 3.5–5.1)
Sodium: 139 mmol/L (ref 135–145)
Total Bilirubin: 0.5 mg/dL (ref 0.3–1.2)
Total Protein: 7 g/dL (ref 6.5–8.1)

## 2019-10-20 MED ORDER — SORBITOL 70 % SOLN: 30.0000 mL | Freq: Two times a day (BID) | 0 refills | Status: DC

## 2019-10-20 MED ORDER — SENNA 8.6 MG PO TABS: 1.0000 | ORAL_TABLET | Freq: Every day | ORAL | 0 refills | Status: DC

## 2019-10-20 NOTE — Progress Notes (Addendum)
Patient has yellow MEWs, protocol started during day shift. MEWs protocol will continue and patient is stable will continue to be monitored. Dawson Bills

## 2019-10-20 NOTE — Discharge Summary (Signed)
Physician Discharge Summary  [name] [name] ZOX:[id number] DOB: 07/20/1955 DOA: 10/18/2019  PCP: [name], MD  Admit date: 10/18/2019 Discharge date: 10/20/2019  Time spent: 35 minutes  Recommendations for Outpatient Follow-up:  1. Will need outpatient follow-up with Dr. Maxie Better of orthopedics with regards to her left lower extremity that has a fracture 2. Needs Chem-12, CBC 1 week 3. Needs overall goals of care discussion with primary care physician is quite debilitated has advanced disease processes and has super morbid obesity  Discharge Diagnoses:  Principal Problem:   Nausea & vomiting Active Problems:   Diabetes type 2, uncontrolled (HCC)   CKD (chronic kidney disease) stage 4, GFR 15-29 ml/min (HCC)   Essential hypertension   Morbid obesity ([place])   GI bleeding   Discharge Condition: Guarded  Diet recommendation: Diabetic  Filed Weights   10/18/19 2006  Weight: (!) 150 kg    History of present illness:  64 morbidly obese black female BMI 60 Sleep apnea DM TY 2 with retinopathy as well as CKD 4 from this HLD HTN Polysubstance abuse previously-previously drank 20 ounces of alcohol per week with prior admissions for metabolic encephalopathy-noted in the past to be on Percocet Flexeril Xanax Ambien Ativan for history of probable depression anxiety and PTSD She also has had several admissions for pannus cellulitis multiple areas of drainage ulcers and hyperkalemia  Recent admission 08/20/2019 with acute toxic metabolic encephalopathy status post ingestion large quantity opioid pain medications in addition to then having several falls She had a hypercarbic respiratory acidosis that improved with Narcan on that admission and had a panic colitis and was treated with doxycycline at that time Again at this admission she was recommended to go to skilled facility but preferred to go home with home health services  Represented to the emergency room 8/16 with nausea  vomiting and was sent home as no acute source was found and she was referred to [facility]   Re presented once again 10/18/2019 with abdominal pain nausea vomiting CT abdomen showing features of possible diverticular bleed vs constipation Occult blood was positive Hemoglobin however baseline is 10 and on admission was 11.9 BUNs/creatinine 31/1.3  Hospital Course:  1. Severe constipation NOT A GI bleed a. Hemoccult is positive however there is no current bleeding and patient claims to have not had a stool in "6 weeks"?? b. Patient had very good effect with sorbitol and miraculously was able to eat multiple meals on 8/29 and did not have any dark stool c. Outpatient GI input as as needed per PCP d. Discharging home on senna in addition to sorbitol 2. Hypokalemia a. Replace orally with K. Dur b. She has no IV access currently and I think we can hold off for the time being 3. CKD 3-4 in a setting of diabetes mellitus  a. anion gap acidosis i. This is variable she has a GFR of 24 b. Monitor trends of fluid input and labs in the morning 4. ?  Pancreatitis unlikely etiology-she is eating and drinking a. Stable b. No concerns for other intra-abdominal pathology other than severe constipation 5. Left knee fracture?  Apparently happened 6 weeks ago a. Appears to have occurred in the past month or so? b. She is followed chronically by Dr. Dellis Filbert been who should follow her up c. Twice daily scheduled a prior history of have been 6. BMI 60 7. Prior polysubstance abuse 8. Recent admission panniculitis a. She has multiple areas in her abdomen and one area under her  left breast that have clean but open wounds b. These will unlikely heal well-she is encouraged to follow-up with her PCP for further management  Procedures:  CT scan (i.e. Studies not automatically included, echos, thoracentesis, etc; not x-rays)  Consultations:  None  Discharge Exam: Vitals:   10/20/19 0936  10/20/19 1339  BP: (!) 110/58 (!) 100/50  Pulse: 94 98  Resp: 19 18  Temp: 98 F (36.7 C) 98 F (36.7 C)  SpO2: 100% 99%    General: Awake alert coherent eating drinking no focal deficit Cardiovascular: S1-S2 no murmur rub or gallop slight tachycardia Respiratory: Clear Looking abdomen she has multiple open wounds one under the pannus on the left side another under the breast and another on her left rear side all are clean and healing well by primary intention however they have potential to worsen she should follow-up in the outpatient setting  Discharge Instructions   Discharge Instructions    Diet - low sodium heart healthy   Complete by: As directed    Discharge instructions   Complete by: As directed    Make sure that you take the senna daily as this will help you pass good stool daily and you will not feel nauseous You were admitted and it was felt that you might of had some amount of bleeding however this is quite unlikely given your hemoglobin has been stable for the past day and a half you are tolerating food no And I think you were quite constipated If the senna does not work then you will need to use the sorbitol on an as-needed basis to ensure that you have a stool at least every 2 days and that this cleans you out Please get wound care at your wound care center as you are going to the monthly and we will resume your home health I would get some attention to your left knee as well as this may prevent you from being mobile and will definitely shorten your expected recovery time-ask your primary care physician about this   Increase activity slowly   Complete by: As directed    No wound care   Complete by: As directed      Allergies as of 10/20/2019      Reactions   Morphine Rash   Penicillins Itching, Nausea And Vomiting, Rash, Hives   Has patient had a PCN reaction causing immediate rash, facial/tongue/throat swelling, SOB or lightheadedness with hypotension: No Has  patient had a PCN reaction causing severe rash involving mucus membranes or skin necrosis: No Has patient had a PCN reaction that required hospitalization: No Has patient had a PCN reaction occurring within the last 10 years: No If all of the above answers are "NO", then may proceed with Cephalosporin use. Has patient had a PCN reaction causing immediate rash, facial/tongue/throat swelling, SOB or lightheadedness with hypotension: No Has patient had a PCN reaction causing severe rash involving mucus membranes or skin necrosis: No Has patient had a PCN reaction that required hospitalization: No Has patient had a PCN reaction occurring within the last 10 years: No If all of the above answers are "NO", then may proceed with Cephalosporin use.   Morphine And Related Itching   Penicillin G       Medication List    STOP taking these medications   acetaminophen 325 MG tablet Commonly known as: TYLENOL   furosemide 40 MG tablet Commonly known as: LASIX   glipiZIDE 5 MG tablet Commonly known as: GLUCOTROL  ibuprofen 800 MG tablet Commonly known as: ADVIL   ondansetron 4 MG disintegrating tablet Commonly known as: Zofran ODT   pantoprazole 40 MG tablet Commonly known as: PROTONIX   terbinafine 250 MG tablet Commonly known as: LAMISIL   zolpidem 10 MG tablet Commonly known as: AMBIEN     TAKE these medications   allopurinol 100 MG tablet Commonly known as: ZYLOPRIM Take 100 mg by mouth 2 (two) times daily.   ALPRAZolam 1 MG tablet Commonly known as: XANAX Take 1 tablet (1 mg total) by mouth 3 (three) times daily as needed for anxiety.   atenolol-chlorthalidone 50-25 MG tablet Commonly known as: TENORETIC Take 1 tablet by mouth daily.   atorvastatin 10 MG tablet Commonly known as: LIPITOR Take 10 mg by mouth daily.   DULoxetine 30 MG capsule Commonly known as: CYMBALTA Take 30 mg by mouth 2 (two) times daily.   gabapentin 600 MG tablet Commonly known as:  NEURONTIN Take 1,800 mg by mouth in the morning.   hydrOXYzine 50 MG tablet Commonly known as: ATARAX/VISTARIL Take 50 mg by mouth every 8 (eight) hours as needed for anxiety or itching.   oxyCODONE-acetaminophen 10-325 MG tablet Commonly known as: PERCOCET Take 1 tablet by mouth every 8 (eight) hours as needed for pain.   pioglitazone 30 MG tablet Commonly known as: ACTOS Take 30 mg by mouth daily.   senna 8.6 MG Tabs tablet Commonly known as: SENOKOT Take 1 tablet (8.6 mg total) by mouth daily.   sitaGLIPtin 100 MG tablet Commonly known as: JANUVIA Take 100 mg by mouth daily.   sorbitol 70 % Soln Take 30 mLs by mouth 2 (two) times daily. As a second choice for constipation if you do not stool with senna      Allergies  Allergen Reactions  . Morphine Rash  . Penicillins Itching, Nausea And Vomiting, Rash and Hives    Has patient had a PCN reaction causing immediate rash, facial/tongue/throat swelling, SOB or lightheadedness with hypotension: No Has patient had a PCN reaction causing severe rash involving mucus membranes or skin necrosis: No Has patient had a PCN reaction that required hospitalization: No Has patient had a PCN reaction occurring within the last 10 years: No If all of the above answers are "NO", then may proceed with Cephalosporin use.  Has patient had a PCN reaction causing immediate rash, facial/tongue/throat swelling, SOB or lightheadedness with hypotension: No Has patient had a PCN reaction causing severe rash involving mucus membranes or skin necrosis: No Has patient had a PCN reaction that required hospitalization: No Has patient had a PCN reaction occurring within the last 10 years: No If all of the above answers are "NO", then may proceed with Cephalosporin use.   Marland Kitchen Morphine And Related Itching  . Penicillin G       The results of significant diagnostics from this hospitalization (including imaging, microbiology, ancillary and laboratory) are  listed below for reference.    Significant Diagnostic Studies: CT ABDOMEN PELVIS WO CONTRAST  Result Date: 10/19/2019 CLINICAL DATA:  64 year old female with nausea and vomiting. EXAM: CT ABDOMEN AND PELVIS WITHOUT CONTRAST TECHNIQUE: Multidetector CT imaging of the abdomen and pelvis was performed following the standard protocol without IV contrast. COMPARISON:  None. FINDINGS: Evaluation of this exam is limited in the absence of intravenous contrast. Lower chest: The visualized lung bases are clear. No intra-abdominal free air or free fluid. Hepatobiliary: No focal liver abnormality is seen. No gallstones, gallbladder wall thickening, or biliary dilatation. Pancreas:  Unremarkable. No pancreatic ductal dilatation or surrounding inflammatory changes. Spleen: Normal in size without focal abnormality. Adrenals/Urinary Tract: The adrenal glands unremarkable. There is no hydronephrosis or nephrolithiasis on either side. There is a small focus of cortical calcification in the superior pole of the left kidney. Subcentimeter right renal inferior pole hypodense focus is too small to characterize. The visualized ureters and urinary bladder appear unremarkable. Stomach/Bowel: There are scattered colonic diverticula without active inflammatory changes. There is high attenuating content within the cecum and ascending colon which may be related to high attenuating fecal matter but concerning for diverticular bleed. Clinical correlation is recommended. There is no bowel obstruction. No evidence of acute appendicitis. Vascular/Lymphatic: Moderate aortoiliac atherosclerotic disease. The IVC is unremarkable. No portal venous gas. There is no adenopathy. Reproductive: Hysterectomy. Other: Anterior pelvic wall postsurgical scarring. Musculoskeletal: Degenerative changes of the spine. No acute osseous pathology. IMPRESSION: 1. Colonic diverticulosis with concern for diverticular bleed in the proximal colon. Clinical correlation is  recommended. No bowel obstruction. 2. Aortic Atherosclerosis (ICD10-I70.0). Electronically Signed   By: Anner Crete M.D.   On: 10/19/2019 00:31   DG Chest Portable 1 View  Result Date: 10/07/2019 CLINICAL DATA:  Shortness of breath. EXAM: PORTABLE CHEST 1 VIEW COMPARISON:  August 20, 2019 FINDINGS: Mild, diffuse chronic appearing increased lung markings are seen without evidence of acute infiltrate, pleural effusion or pneumothorax. Mild bilateral infrahilar atelectasis is noted. The heart size and mediastinal contours are within normal limits. The visualized skeletal structures are unremarkable. IMPRESSION: Mild bilateral infrahilar atelectasis without evidence of acute or active cardiopulmonary disease. Electronically Signed   By: Virgina Norfolk M.D.   On: 10/07/2019 21:57    Microbiology: Recent Results (from the past 240 hour(s))  SARS Coronavirus 2 by RT PCR (hospital order, performed in [facility] hospital lab) Nasopharyngeal Nasopharyngeal Swab     Status: None   Collection Time: 10/19/19  3:07 AM   Specimen: Nasopharyngeal Swab  Result Value Ref Range Status   SARS Coronavirus 2 NEGATIVE NEGATIVE Final    Comment: (NOTE) SARS-CoV-2 target nucleic acids are NOT DETECTED.  The SARS-CoV-2 RNA is generally detectable in upper and lower respiratory specimens during the acute phase of infection. The lowest concentration of SARS-CoV-2 viral copies this assay can detect is 250 copies / mL. A negative result does not preclude SARS-CoV-2 infection and should not be used as the sole basis for treatment or other patient management decisions.  A negative result may occur with improper specimen collection / handling, submission of specimen other than nasopharyngeal swab, presence of viral mutation(s) within the areas targeted by this assay, and inadequate number of viral copies (<250 copies / mL). A negative result must be combined with clinical observations, patient history, and  epidemiological information.  Fact Sheet for Patients:   StrictlyIdeas.no  Fact Sheet for Healthcare Providers: BankingDealers.co.za  This test is not yet approved or  cleared by the [place] FDA and has been authorized for detection and/or diagnosis of SARS-CoV-2 by FDA under an Emergency Use Authorization (EUA).  This EUA will remain in effect (meaning this test can be used) for the duration of the COVID-19 declaration under Section 564(b)(1) of the Act, 21 U.S.C. section 360bbb-3(b)(1), unless the authorization is terminated or revoked sooner.  Performed at [facility], [place] [address] [place], [place] [address]      Labs: Basic Metabolic Panel: Recent Labs  Lab 10/18/19 2317 10/19/19 0804 10/20/19 0536  NA 138 139 139  K 3.2*  3.0* 3.1*  CL 95* 94* 100  CO2 28 27 27   GLUCOSE 166* 163* 149*  BUN 34* 36* 36*  CREATININE 1.95* 2.07* 2.81*  CALCIUM 9.9 10.2 9.4   Liver Function Tests: Recent Labs  Lab 10/18/19 2317 10/19/19 0804 10/20/19 0536  AST 25 26 27   ALT 29 28 31   ALKPHOS 123 125 112  BILITOT 0.7 0.9 0.5  PROT 7.7 7.7 7.0  ALBUMIN 3.9 4.0 3.4*   Recent Labs  Lab 10/18/19 2317  LIPASE 71*   No results for input(s): AMMONIA in the last 168 hours. CBC: Recent Labs  Lab 10/18/19 2158 10/19/19 0804 10/19/19 0950 10/20/19 0536  WBC 7.4 7.4 6.9 5.0  NEUTROABS 4.1  --   --  2.6  HGB 11.9* 12.0 11.4* 11.0*  HCT 40.6 39.9 37.8 37.1  MCV 86.8 84.7 85.1 86.1  PLT 480* 486* 480* 460*   Cardiac Enzymes: No results for input(s): CKTOTAL, CKMB, CKMBINDEX, TROPONINI in the last 168 hours. BNP: BNP (last 3 results) Recent Labs    05/06/19 1343 08/20/19 1250  BNP 258.1* 511.0*    ProBNP (last 3 results) No results for input(s): PROBNP in the last 8760 hours.  CBG: Recent Labs  Lab 10/19/19 2001 10/20/19 0003 10/20/19 0407 10/20/19 0743 10/20/19 1206  GLUCAP 223* 173*  146* 150* 367*       Signed:  Nita Sells MD   [facility] 10/20/2019, 2:15 PM

## 2019-10-20 NOTE — Progress Notes (Signed)
Assessment unchanged. Pt verbalized understanding of dc instructions and understands follow up care. Discharged to home via [facility]. Paramedics x 2 accompanied pt via stretcher to ambulance.

## 2019-10-20 NOTE — TOC Progression Note (Signed)
Transition of Care [facility]) - Progression Note    Patient Details  Name: [name] [name] MRN: [id number] Date of Birth: 01/25/1956  [facility]) CM/SW Contact  Joaquin Courts, RN Phone Number: 10/20/2019, 3:06 PM  Clinical Narrative:    Patient active with [facility] prior to admission.  Agency notified to resume services.  Patient will receive HHPT/RN/AIDE/social work.     Expected Discharge Plan: [place] Barriers to Discharge: No Barriers Identified  Expected Discharge Plan and Services Expected Discharge Plan: [place]   Discharge Planning Services: CM Consult Post Acute Care Choice: Resumption of Svcs/PTA Provider Living arrangements for the past 2 months: Single Family Home Expected Discharge Date: 10/20/19                         HH Arranged: PT, RN, Nurse's Aide, Social Work [employer] Agency: [facility] Date [place]: 10/20/19 Time [place]: 1506 Representative spoke with at [place]: Amy   Social Determinants of Health ([place]) Interventions    Readmission Risk Interventions No flowsheet data found.

## 2019-10-23 ENCOUNTER — Other Ambulatory Visit: Payer: Self-pay

## 2019-10-23 ENCOUNTER — Encounter (HOSPITAL_COMMUNITY): Payer: Self-pay

## 2019-10-23 ENCOUNTER — Emergency Department (HOSPITAL_COMMUNITY)
Admission: EM | Admit: 2019-10-23 | Discharge: 2019-10-29 | Disposition: A | Discharge status: Home / Self Care | Payer: No Typology Code available for payment source | Attending: Emergency Medicine | Admitting: Emergency Medicine

## 2019-10-23 DIAGNOSIS — Z20822 Contact with and (suspected) exposure to covid-19: Secondary | ICD-10-CM | POA: Insufficient documentation

## 2019-10-23 DIAGNOSIS — Z742 Need for assistance at home and no other household member able to render care: Secondary | ICD-10-CM | POA: Diagnosis not present

## 2019-10-23 DIAGNOSIS — E669 Obesity, unspecified: Secondary | ICD-10-CM | POA: Diagnosis not present

## 2019-10-23 DIAGNOSIS — N184 Chronic kidney disease, stage 4 (severe): Secondary | ICD-10-CM | POA: Diagnosis not present

## 2019-10-23 DIAGNOSIS — Z79899 Other long term (current) drug therapy: Secondary | ICD-10-CM | POA: Diagnosis not present

## 2019-10-23 DIAGNOSIS — L089 Local infection of the skin and subcutaneous tissue, unspecified: Secondary | ICD-10-CM | POA: Insufficient documentation

## 2019-10-23 DIAGNOSIS — I129 Hypertensive chronic kidney disease with stage 1 through stage 4 chronic kidney disease, or unspecified chronic kidney disease: Secondary | ICD-10-CM | POA: Diagnosis not present

## 2019-10-23 DIAGNOSIS — S72492G Other fracture of lower end of left femur, subsequent encounter for closed fracture with delayed healing: Secondary | ICD-10-CM

## 2019-10-23 DIAGNOSIS — E119 Type 2 diabetes mellitus without complications: Secondary | ICD-10-CM | POA: Diagnosis not present

## 2019-10-23 DIAGNOSIS — Z87891 Personal history of nicotine dependence: Secondary | ICD-10-CM | POA: Insufficient documentation

## 2019-10-23 LAB — CBC WITH DIFFERENTIAL/PLATELET
Abs Immature Granulocytes: 0.02 10*3/uL (ref 0.00–0.07)
Basophils Absolute: 0 10*3/uL (ref 0.0–0.1)
Basophils Relative: 1 %
Eosinophils Absolute: 0.3 10*3/uL (ref 0.0–0.5)
Eosinophils Relative: 4 %
HCT: 34 % — ABNORMAL LOW (ref 36.0–46.0)
Hemoglobin: 10.1 g/dL — ABNORMAL LOW (ref 12.0–15.0)
Immature Granulocytes: 0 %
Lymphocytes Relative: 23 %
Lymphs Abs: 1.7 10*3/uL (ref 0.7–4.0)
MCH: 25.8 pg — ABNORMAL LOW (ref 26.0–34.0)
MCHC: 29.7 g/dL — ABNORMAL LOW (ref 30.0–36.0)
MCV: 86.7 fL (ref 80.0–100.0)
Monocytes Absolute: 0.9 10*3/uL (ref 0.1–1.0)
Monocytes Relative: 12 %
Neutro Abs: 4.5 10*3/uL (ref 1.7–7.7)
Neutrophils Relative %: 60 %
Platelets: 530 10*3/uL — ABNORMAL HIGH (ref 150–400)
RBC: 3.92 MIL/uL (ref 3.87–5.11)
RDW: 17.3 % — ABNORMAL HIGH (ref 11.5–15.5)
WBC: 7.5 10*3/uL (ref 4.0–10.5)
nRBC: 0 % (ref 0.0–0.2)

## 2019-10-23 LAB — COMPREHENSIVE METABOLIC PANEL
ALT: 25 U/L (ref 0–44)
AST: 21 U/L (ref 15–41)
Albumin: 3.2 g/dL — ABNORMAL LOW (ref 3.5–5.0)
Alkaline Phosphatase: 119 U/L (ref 38–126)
Anion gap: 10 (ref 5–15)
BUN: 37 mg/dL — ABNORMAL HIGH (ref 8–23)
CO2: 26 mmol/L (ref 22–32)
Calcium: 9 mg/dL (ref 8.9–10.3)
Chloride: 106 mmol/L (ref 98–111)
Creatinine, Ser: 1.71 mg/dL — ABNORMAL HIGH (ref 0.44–1.00)
GFR calc Af Amer: 36 mL/min — ABNORMAL LOW (ref >60)
GFR calc non Af Amer: 31 mL/min — ABNORMAL LOW (ref >60)
Glucose, Bld: 231 mg/dL — ABNORMAL HIGH (ref 70–99)
Potassium: 4.2 mmol/L (ref 3.5–5.1)
Sodium: 142 mmol/L (ref 135–145)
Total Bilirubin: 0.3 mg/dL (ref 0.3–1.2)
Total Protein: 6.7 g/dL (ref 6.5–8.1)

## 2019-10-23 LAB — CBG MONITORING, ED: Glucose-Capillary: 218 mg/dL — ABNORMAL HIGH (ref 70–99)

## 2019-10-23 NOTE — Progress Notes (Addendum)
CSW spoke to the EDP who states that the pt cannot care for herself and that her caregiver has just had surgery and cannot care for the pt.  EDP is still facilitating a medical work up and CSW awaiting the results now.  10:57 PM CSW spoke to EDP who confirmed pt, who just discharged on Sunday, is in relatively good condition compared to previous admissions (bood sugar levels relatively appropriate, etc) is not going to be admitted.  Per the ED, due to the pt having no other support in her family the pt's caregiver who has undergone surgery was the pt's only support.  Per the EDP, pt states she has attempted to find assistance through the [place] earlier today but encountered no success.  2nd shift ED CSW will leave handoff for 1st shift ED CSW.  CSW will continue to follow for D/C needs.  Alphonse Guild. Cabrini Ruggieri  MSW, LCSW, LCAS, CCS Transitions of Care Clinical Social Worker Care Coordination Department Ph: [phone]

## 2019-10-23 NOTE — ED Notes (Signed)
Roderic Palau from social work contacted for after care for pt discharge.

## 2019-10-23 NOTE — ED Provider Notes (Addendum)
Medical screening examination/treatment/procedure(s) were conducted as a shared visit with non-physician practitioner(s) and myself.  I personally evaluated the patient during the encounter.      Patient seen by me along with physician assistant.  Patient just discharged from the hospital on Sunday.  Patient returns today because she has no home health care.  She had a prior to the last admission.  But has no one that can come out now.  Patient unable to care for herself.  Patient known to be a poorly controlled diabetic.  Often has significant medical issues that resulted in admission.  We will do a screen see if patient meets medical criteria for admission.  If not patient will need to have social worker get involved.  Update discussed with evening a Education officer, museum.  Once we have determined that patient is not a candidate for medical admission which we do not know yet.  We will recontact social worker.  They will get care management involved in the morning.     Fredia Sorrow, MD 10/23/19 2149   Patient not meeting criteria for admission based on labs.  Renal function is actually improved for her.  No significant hemoglobin blood sugars are not significantly elevated no evidence of any acidosis.  We will formally put in a consult for social worker case manager for home health needs.    Fredia Sorrow, MD 10/23/19 2245

## 2019-10-23 NOTE — ED Notes (Signed)
Pt reports she is a hard stick and they always have to use [place] to get an IV.

## 2019-10-23 NOTE — ED Triage Notes (Signed)
Pt BIB EMS from home. Pt presents today due to not having a caregiver to care for her. Pt has no complaints.  CBG 335
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
## 2019-10-24 ENCOUNTER — Encounter (HOSPITAL_BASED_OUTPATIENT_CLINIC_OR_DEPARTMENT_OTHER): Payer: No Typology Code available for payment source | Attending: Internal Medicine | Admitting: Internal Medicine

## 2019-10-24 LAB — URINALYSIS, ROUTINE W REFLEX MICROSCOPIC
Bilirubin Urine: NEGATIVE
Glucose, UA: NEGATIVE mg/dL
Hgb urine dipstick: NEGATIVE
Ketones, ur: NEGATIVE mg/dL
Nitrite: NEGATIVE
Protein, ur: NEGATIVE mg/dL
Specific Gravity, Urine: 1.016 (ref 1.005–1.030)
WBC, UA: >50 WBC/hpf — ABNORMAL HIGH (ref 0–5)
pH: 5 (ref 5.0–8.0)

## 2019-10-24 LAB — CBG MONITORING, ED
Glucose-Capillary: 136 mg/dL — ABNORMAL HIGH (ref 70–99)
Glucose-Capillary: 263 mg/dL — ABNORMAL HIGH (ref 70–99)

## 2019-10-24 MED ORDER — ATENOLOL 50 MG PO TABS
50.0000 mg | ORAL_TABLET | Freq: Every day | ORAL | Status: DC
  Administered 2019-10-25 – 2019-10-29 (×4): 50 mg via ORAL
  Filled 2019-10-24 (×6): qty 1

## 2019-10-24 MED ORDER — ATORVASTATIN CALCIUM 10 MG PO TABS
10.0000 mg | ORAL_TABLET | Freq: Every day | ORAL | Status: DC
  Administered 2019-10-24 – 2019-10-29 (×5): 10 mg via ORAL
  Filled 2019-10-24 (×6): qty 1

## 2019-10-24 MED ORDER — CHLORTHALIDONE 25 MG PO TABS
25.0000 mg | ORAL_TABLET | Freq: Every day | ORAL | Status: DC
  Administered 2019-10-25 – 2019-10-29 (×4): 25 mg via ORAL
  Filled 2019-10-24 (×6): qty 1

## 2019-10-24 MED ORDER — ALPRAZOLAM 1 MG PO TABS
1.0000 mg | ORAL_TABLET | Freq: Three times a day (TID) | ORAL | Status: DC | PRN
  Administered 2019-10-25 – 2019-10-28 (×5): 1 mg via ORAL
  Filled 2019-10-24 (×4): qty 1

## 2019-10-24 MED ORDER — SENNA 8.6 MG PO TABS
1.0000 | ORAL_TABLET | Freq: Every day | ORAL | Status: DC | PRN
  Administered 2019-10-24 – 2019-10-26 (×2): 8.6 mg via ORAL
  Filled 2019-10-24 (×2): qty 1

## 2019-10-24 MED ORDER — GABAPENTIN 300 MG PO CAPS
1800.0000 mg | ORAL_CAPSULE | Freq: Every day | ORAL | Status: DC
  Administered 2019-10-24 – 2019-10-29 (×6): 1800 mg via ORAL
  Filled 2019-10-24 (×6): qty 6

## 2019-10-24 MED ORDER — ATENOLOL-CHLORTHALIDONE 50-25 MG PO TABS: 1.0000 | ORAL_TABLET | Freq: Every day | ORAL | Status: DC

## 2019-10-24 MED ORDER — DULOXETINE HCL 30 MG PO CPEP
30.0000 mg | ORAL_CAPSULE | Freq: Two times a day (BID) | ORAL | Status: DC
  Administered 2019-10-24 – 2019-10-29 (×11): 30 mg via ORAL
  Filled 2019-10-24 (×11): qty 1

## 2019-10-24 NOTE — Discharge Planning (Addendum)
Pt currently active with [facility] for RN/PT services.  Resumption of care requested. Gevena Barre, RN of [facility] notified.  No DME needs identified at this time.

## 2019-10-24 NOTE — Progress Notes (Signed)
   10/24/19 0951  TOC ED Mini Assessment  TOC Time spent with patient (minutes): 30  PING Used in TOC Assessment Yes  Admission or Readmission Diverted Yes  Interventions which prevented an admission or readmission SNF Placement;Home Health Consult or Services  Barriers to Discharge ED Unsafe disposition

## 2019-10-24 NOTE — ED Notes (Signed)
Transferred to room 30 at this time, assumed care
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
## 2019-10-24 NOTE — ED Notes (Signed)
Patient remains aox4, no complaints, waiting on case management for dispo home

## 2019-10-24 NOTE — ED Notes (Signed)
Pt gives oral permission for SW to talk to family.

## 2019-10-24 NOTE — ED Notes (Signed)
Pt has been alert this shift. Pt has been cooperative. Pt in bed. Pt does not ambulate.

## 2019-10-24 NOTE — NC FL2 (Signed)
[place] LEVEL OF CARE SCREENING TOOL     IDENTIFICATION  Patient Name: [name] [name] Birthdate: 10-17-1955 Sex: female Admission Date (Current Location): 10/23/2019  [facility] and [place] Number:  Herbalist and Address:  [facility],  [place] [address], [place]      Provider Number: [id number]  Attending Physician Name and Address:  Default, Provider, MD  Relative Name and Phone Number:  Christin Fudge #[phone]    Current Level of Care: Hospital Recommended Level of Care: [place] Prior Approval Number:    Date Approved/Denied:   PASRR Number: [id number]  Discharge Plan: SNF    Current Diagnoses: Patient Active Problem List   Diagnosis Date Noted  . GI bleeding 10/19/2019  . Nausea & vomiting 10/19/2019  . Morbid obesity ([place]) 08/22/2019  . AKI (acute kidney injury) ([place]) 08/21/2019  . Multiple wounds 08/21/2019  . Opiate overdose ([place]) 08/20/2019  . Cellulitis 10/05/2018  . Myalgia 10/05/2018  . Acute cystitis without hematuria 10/05/2018  . Essential hypertension 10/05/2018  . Pressure injury of skin 10/05/2018  . Hypoglycemia secondary to sulfonylurea 11/21/2016  . CKD (chronic kidney disease) stage 4, GFR 15-29 ml/min (HCC) 11/21/2016  . Substance induced mood disorder ([place]) 07/02/2014  . Acute renal failure syndrome ([place])   . Encephalopathy   . Hyperkalemia   . Hypokalemia   . Leukocytosis   . Diabetes type 2, uncontrolled ([place])   . HLD (hyperlipidemia)   . Metabolic encephalopathy   . Depression   . Anxiety state   . Acute renal failure ([place]) 06/28/2014    Orientation RESPIRATION BLADDER Height & Weight     Self, Time, Situation, Place  Normal Incontinent Weight: (!) 150 kg Height:  5\' 2"  (157.5 cm)  BEHAVIORAL SYMPTOMS/MOOD NEUROLOGICAL BOWEL NUTRITION STATUS      Incontinent Diet (Carb Modified)  AMBULATORY STATUS COMMUNICATION OF NEEDS Skin   Extensive Assist Verbally PU  Stage and Appropriate Care (abdominal, left buttock) PU Stage 1 Dressing: Daily                     Personal Care Assistance Level of Assistance  Bathing, Feeding, Dressing Bathing Assistance: Maximum assistance Feeding assistance: Limited assistance Dressing Assistance: Maximum assistance     Functional Limitations Info  Sight, Hearing, Speech Sight Info: Adequate Hearing Info: Adequate Speech Info: Adequate    SPECIAL CARE FACTORS FREQUENCY  OT (By licensed OT), PT (By licensed PT)     PT Frequency: 5x per week OT Frequency: 5x per week            Contractures Contractures Info: Not present    Additional Factors Info  Code Status, Allergies Code Status Info: Full Code Allergies Info: Morphine, Penicillin, Penicillin G, Morphine Related           Current Medications (10/24/2019):  This is the current hospital active medication list Current Facility-Administered Medications  Medication Dose Route Frequency Provider Last Rate Last Admin  . ALPRAZolam Duanne Moron) tablet 1 mg  1 mg Oral TID PRN Long, Wonda Olds, MD      . atenolol (TENORMIN) tablet 50 mg  50 mg Oral Daily Green, Terri L, RPH       And  . chlorthalidone (HYGROTON) tablet 25 mg  25 mg Oral Daily Minda Ditto, RPH      . atorvastatin (LIPITOR) tablet 10 mg  10 mg Oral Daily Long, Wonda Olds, MD   10 mg at  10/24/19 1127  . DULoxetine (CYMBALTA) DR capsule 30 mg  30 mg Oral BID Long, Wonda Olds, MD   30 mg at 10/24/19 1127  . gabapentin (NEURONTIN) capsule 1,800 mg  1,800 mg Oral Daily Long, Wonda Olds, MD   1,800 mg at 10/24/19 1127  . senna (SENOKOT) tablet 8.6 mg  1 tablet Oral Daily PRN Long, Wonda Olds, MD   8.6 mg at 10/24/19 1150   Current Outpatient Medications  Medication Sig Dispense Refill  . allopurinol (ZYLOPRIM) 100 MG tablet Take 100 mg by mouth 2 (two) times daily.    Marland Kitchen ALPRAZolam (XANAX) 1 MG tablet Take 1 tablet (1 mg total) by mouth 3 (three) times daily as needed for anxiety. 15 tablet 0  .  atenolol-chlorthalidone (TENORETIC) 50-25 MG tablet Take 1 tablet by mouth daily.    Marland Kitchen atorvastatin (LIPITOR) 10 MG tablet Take 10 mg by mouth daily.    . DULoxetine (CYMBALTA) 30 MG capsule Take 30 mg by mouth 2 (two) times daily.    Marland Kitchen gabapentin (NEURONTIN) 600 MG tablet Take 1,800 mg by mouth in the morning.     . hydrOXYzine (ATARAX/VISTARIL) 50 MG tablet Take 50 mg by mouth every 8 (eight) hours as needed for anxiety or itching.     Marland Kitchen oxyCODONE-acetaminophen (PERCOCET) 10-325 MG tablet Take 1 tablet by mouth every 8 (eight) hours as needed for pain.    . pioglitazone (ACTOS) 30 MG tablet Take 30 mg by mouth daily.    Marland Kitchen senna (SENOKOT) 8.6 MG TABS tablet Take 1 tablet (8.6 mg total) by mouth daily. 120 tablet 0  . sitaGLIPtin (JANUVIA) 100 MG tablet Take 100 mg by mouth daily.    . sorbitol 70 % SOLN Take 30 mLs by mouth 2 (two) times daily. As a second choice for constipation if you do not stool with senna 473 mL 0     Discharge Medications: Please see discharge summary for a list of discharge medications.  Relevant Imaging Results:  Relevant Lab Results:   Additional Information SS # [id number]  Erenest Rasher, RN

## 2019-10-24 NOTE — TOC Initial Note (Signed)
Transition of Care [facility]) - Initial/Assessment Note    Patient Details  Name: [name] [name] MRN: [id number] Date of Birth: 05/17/1955  Transition of Care [facility]) CM/SW Contact:    Erenest Rasher, RN Phone Number: [phone] 10/24/2019, 7:09 PM  Clinical Narrative:                  TOC CM spoke to pt at bedside. States she lives at home alone. She has wheelchair and rolling walker at home. She has a friend that assisted with her getting to appts until she injured her left leg. She is currently non-weight bearing to left leg.  Gave permission to speak to son, sisters, Mervyn Skeeters, and Enid Derry. Gave permission to create FL2 and fax referral to SNF for rehab. Gave permission to utilize [facility] for SNF rehab.    Expected Discharge Plan: Skilled Nursing Facility Barriers to Discharge: Continued Medical Work up, SNF Pending bed offer   Patient Goals and CMS Choice Patient states their goals for this hospitalization and ongoing recovery are:: i want to be able to walk again CMS Medicare.gov Compare Post Acute Care list provided to:: Patient Choice offered to / list presented to : Patient  Expected Discharge Plan and Services Expected Discharge Plan: [place] In-house Referral: Clinical Social Work   Post Acute Care Choice: [place] Living arrangements for the past 2 months: [place]                                      Prior Living Arrangements/Services Living arrangements for the past 2 months: Single Family Home Lives with:: Self Patient language and need for interpreter reviewed:: Yes        Need for Family Participation in Patient Care: Yes (Comment) Care giver support system in place?: Yes (comment) Current home services: DME (rolling walker, wheelchair) Criminal Activity/Legal Involvement Pertinent to Current Situation/Hospitalization: No - Comment as needed  Activities of Daily Living      Permission  Sought/Granted Permission sought to share information with : Case Manager, PCP, Customer service manager, Family Supports Permission granted to share information with : Yes, Verbal Permission Granted  Share Information with NAME: Christin Fudge, Essie Brown, [place] granted to share info w AGENCY: SNF  Permission granted to share info w Relationship: son  Permission granted to share info w Contact Information: [phone]  Emotional Assessment Appearance:: Appears stated age Attitude/Demeanor/Rapport: Engaged Affect (typically observed): Accepting Orientation: : Oriented to Self, Oriented to Place, Oriented to  Time, Oriented to Situation   Psych Involvement: No (comment)  Admission diagnosis:  hyperglcemia, bedridden Patient Active Problem List   Diagnosis Date Noted  . GI bleeding 10/19/2019  . Nausea & vomiting 10/19/2019  . Morbid obesity ([place]) 08/22/2019  . AKI (acute kidney injury) ([place]) 08/21/2019  . Multiple wounds 08/21/2019  . Opiate overdose ([place]) 08/20/2019  . Cellulitis 10/05/2018  . Myalgia 10/05/2018  . Acute cystitis without hematuria 10/05/2018  . Essential hypertension 10/05/2018  . Pressure injury of skin 10/05/2018  . Hypoglycemia secondary to sulfonylurea 11/21/2016  . CKD (chronic kidney disease) stage 4, GFR 15-29 ml/min (HCC) 11/21/2016  . Substance induced mood disorder ([place]) 07/02/2014  . Acute renal failure syndrome ([place])   . Encephalopathy   . Hyperkalemia   . Hypokalemia   . Leukocytosis   . Diabetes type 2, uncontrolled ([place])   .  HLD (hyperlipidemia)   . Metabolic encephalopathy   . Depression   . Anxiety state   . Acute renal failure ([place]) 06/28/2014   PCP:  [name], MD Pharmacy:   [facility] [id number] [address], [place] [address] [place] [address] Phone: [phone] Fax: [phone]     Social Determinants of  Health (SDOH) Interventions    Readmission Risk Interventions No flowsheet data found.

## 2019-10-25 ENCOUNTER — Emergency Department (HOSPITAL_COMMUNITY): Payer: No Typology Code available for payment source

## 2019-10-25 LAB — CBG MONITORING, ED
Glucose-Capillary: 169 mg/dL — ABNORMAL HIGH (ref 70–99)
Glucose-Capillary: 184 mg/dL — ABNORMAL HIGH (ref 70–99)

## 2019-10-25 MED ORDER — SENNOSIDES-DOCUSATE SODIUM 8.6-50 MG PO TABS
1.0000 | ORAL_TABLET | Freq: Two times a day (BID) | ORAL | Status: DC | PRN
  Administered 2019-10-25: 1 via ORAL
  Filled 2019-10-25: qty 1

## 2019-10-25 MED ORDER — POLYETHYLENE GLYCOL 3350 17 G PO PACK
17.0000 g | PACK | Freq: Every day | ORAL | Status: DC | PRN
  Administered 2019-10-25: 17 g via ORAL
  Filled 2019-10-25 (×3): qty 1

## 2019-10-25 NOTE — ED Provider Notes (Signed)
Emergency Medicine Observation Re-evaluation Note  [name] [name] is a 64 y.o. female, seen on rounds today.  Pt initially presented to the ED for complaints of Well Check Currently, the patient is resting in bed. Complains of constipation.  Physical Exam  BP (!) 112/50 (BP Location: Left Arm)   Pulse (!) 108   Temp 98.9 F (37.2 C) (Oral)   Resp 16   Ht 5\' 2"  (1.575 m)   Wt (!) 150 kg   SpO2 95%   BMI 60.48 kg/m  Physical Exam General: resting in bed Cardiac: well perfused Lungs: no respiratory distress, unlabored Psych: resting comfortably  ED Course / MDM  EKG:  Clinical Course as of Oct 24 804  Wed Oct 23, 2019  2028 Delay in obtaining labs as patient is a difficult stick, IV team consult ordered   [KA]    Clinical Course User Index [KA] Albrizze, Harley Hallmark, PA-C   I have reviewed the labs performed to date as well as medications administered while in observation.  Recent changes in the last 24 hours include social work working on placement.  Plan  Current plan is for SNF placement, SW assisting. Patient is not under full IVC at this time.   Lucrezia Starch, MD 10/25/19 [phone]

## 2019-10-25 NOTE — Progress Notes (Signed)
Patient ID: [name] [name], female   DOB: 09-12-1955, 64 y.o.   MRN: [id number]  Patient called into the office indicating that she was at the hospital.  And would not be able to probably make her outpatient office visit to follow-up on the fracture of her distal femur.  I placed an order for an x-ray of her left knee to evaluate her fracture healing.  She is to be nonweightbearing on the left leg.  Using pillow splints in bed.

## 2019-10-25 NOTE — Progress Notes (Addendum)
TOC CM received acceptance from [place] for SNF. Will provide offer to patient as [facility] will need to start [place] for [facility]. Will not possible receive auth if they are not waiving until 10/29/2019 due to Labor Day Holiday. Pt has been vaccinated. Will need a COVID prior to dc to SNF. (no greater than 24 hours). Will need instructions for dressing changes on AVS.    Jonnie Finner RN [place], [place] ED TOC CM [phone]

## 2019-10-25 NOTE — Consult Note (Addendum)
WOC Nurse Consult Note: Patient receiving care in [facility] ED 30. Reason for Consult: "wounds" Wound type: unknown to me, and primary RN, Roselee Culver, was unable of type of wounds or wound location. I have reviewed the flowsheet documentation, and I will enter wound orders based on the flowsheet entries for skin issues.  I also received a Secure Chat from [place] telling me the patient has a stage 2 on the sacrum. Pressure Injury POA: Yes/No/NA Measurement: Wound bed: Drainage (amount, consistency, odor)  Periwound: Dressing procedure/placement/frequency: Saline moistened gauze to be placed into abdominal wounds. Cover with dry gauze or ABD pad(s), tape in place. To be done once or twice daily. For the MASD-ITD below the breasts, use InterDry textile to wick moisture away. Foam dressing to sacral stage 2 wound; change every 3 days and prn. Val Riles, RN, MSN, CWOCN, CNS-BC, pager [phone]

## 2019-10-25 NOTE — Progress Notes (Signed)
TOC CSW spoke with pt in regards to bed acceptance.  Tammy Blakely/[facility] has accepted pt.  Pt would have a private bed available on Monday, 10/28/2019.  Pt has accepted [facility]'s bed offer.  CSW will continue to follow for dc needs.  Joneric Streight Tarpley-Carter, MSW, LCSW-A                  [facility] ED Transitions of CareClinical Social Worker [email or web address] [phone]

## 2019-10-26 LAB — CBG MONITORING, ED
Glucose-Capillary: 195 mg/dL — ABNORMAL HIGH (ref 70–99)
Glucose-Capillary: 176 mg/dL — ABNORMAL HIGH (ref 70–99)
Glucose-Capillary: 203 mg/dL — ABNORMAL HIGH (ref 70–99)
Glucose-Capillary: 215 mg/dL — ABNORMAL HIGH (ref 70–99)

## 2019-10-26 NOTE — ED Notes (Signed)
Pr has been cooperative this shift.  Dressings changed this shift.  SCD in place. Pt does not want them on currently.

## 2019-10-26 NOTE — Progress Notes (Deleted)
Patient ID: [name] [name], female   DOB: 02/18/1956, 64 y.o.   MRN: [id number]   I spoke with Dr. Vallery Ridge about the patient.  She will consult orthopedic technician to see if there is anything other than Pella splints that would be of benefit to her.  We also discussed her current situation.  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  That is given her a valgus displacement of her knee with severe underlying osteoarthritis.  Surgical options would require an extensive surgical procedure that I feel carries with it significant risk.  These include wound healing based on her body habitus and her underlying diabetes.  Inability to anticoagulate postoperatively due to her hemorrhagic gastritis history.  Open wounds in her abdomen and side increasing risk of infection.  The other option is to continue conservative pillow splints nonweightbearing assisted transfers.  See if we can get bony union.  That would be the safer of the 2 however with the resultant deformity she very well may be wheelchair-bound      . .  I had a phone conversation with the patient as well.  Describing the new x-rays showing the callus formation with partial healing and that the femoral condyle is displaced.  And underlying severe osteoarthritis.  Benefit healed in this position she would have a leg deformity like a knock-knee that may even preclude her ambulation.  And she may be wheelchair-bound versus an operative takedown of the nonunion or partial union and ORIF in the context of significant operative risk that includes healing due to her diabetes and other nonhealing wounds that she has on her abdomen and on her side.  Recent UTI as well as hemorrhagic gastritis which is essentially preclude postoperative anticoagulation to decrease the risk of blood clot and pulmonary embolism.  Also her body habitus and significant adipose tissue in her thigh would also add to the postoperative risk of wound healing.  The  patient understands both scenarios.  She has to give some thought about it.  Consulted one of my partners who agrees with the 2 options.  For right now we will continue the conservative treatment pillow splints nonweightbearing protected transfers.  Patient reports that when she takes pain medicine I can control her pain.  Reports that she is resting comfortably are reassuring.  I may consult one of the trauma physicians to determine whether they have any other ideas.  In any event I would need to see her in 2 weeks for repeat x-rays and to see how she is doing.  I also called her sister and described the situation and the 2 scenarios.  She is not in favor of operative intervention at this point.  She will least have help in a rehab unit.  My cell phone number is [phone]  Difficult situation In addition even if the lateral condyle is fixed she still has severe osteoarthritis of the knee which may preclude ambulation.  And she is not a candidate for a total knee replacement.  Both the patient and her sister have my cell phone number for further consultation as necessary.

## 2019-10-26 NOTE — ED Provider Notes (Signed)
Emergency Medicine Observation Re-evaluation Note  [name] [name] is a 64 y.o. female, seen on rounds today.  Pt initially presented to the ED for complaints of Well Check Currently, the patient is alert and nontoxic.  She is bedbound due to body habitus and left lower extremity fracture of unknown age. Patient has several general complaints.  She points out the areas of opposing skin with rash.  No acute complaints regarding these.  She also advised that she has been constipated.  She does not feel like any medication is helping with her constipation.  She does not have abdominal pain or vomiting.  Patient has been eating without difficulty.  Physical Exam  BP (!) 109/55   Pulse 77   Temp 98.2 F (36.8 C)   Resp 14   Ht 5\' 2"  (1.575 m)   Wt (!) 150 kg   SpO2 95%   BMI 60.48 kg/m  Physical Exam General: Patient is alert and nontoxic.  No distress.  Morbid obesity. Cardiac: Regular no appreciable rub murmur gallop. Lungs: Grossly clear although auscultation difficult due to body habitus.  No respiratory distress. Skin: Skin folds under breasts have only very minor erosion under the left breast.  Nursing has placed dry gauzes but needs breast tissue that opposes.  Patient has larger more moist maceration of lower abdominal wall pannus.  No sign of cellulitis. Extremities: The left lower extremity is somewhat shortened and externally rotated.  Extremities have significant obesity however patient does not appear to have any significant edema of the feet or lower legs.  Calves are nontender.  ED Course / MDM  EKG:  Clinical Course as of Oct 25 829  Wed Oct 23, 2019  2028 Delay in obtaining labs as patient is a difficult stick, IV team consult ordered   [KA]    Clinical Course User Index [KA] Albrizze, Harley Hallmark, PA-C   I have reviewed the labs performed to date as well as medications administered while in observation.  Recent changes in the last 24 hours include none.  Plan   Current plan is for patient is boarding awaiting SNF placement. Patient is not under full IVC at this time.  We will have nursing continue to treat for possible constipation with MiraLAX and Colace.  Patient does have history of prior GI bleed during previous admission.  We will try to add mechanical DVT prophylaxis.  Patient is identified knee fracture appears to be subacute.  There is no significant effusion or erythema.  The area of fracture is the distal femur.  It appears feasible to apply SCDs as tolerated.  Do not think this will contribute to any pain regarding her knee process.   Charlesetta Shanks, MD 10/26/19 [phone]

## 2019-10-26 NOTE — Progress Notes (Signed)
Date of Service:  10/26/2019 11:00AM           Patient ID: [name] [name], female   DOB: Mar 27, 1955, 64 y.o.   MRN: [id number]   I spoke with Dr. Vallery Ridge about the patient.  She will consult orthopedic technician to see if there is anything other than Pella splints that would be of benefit to her.  We also discussed her current situation.  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  That is given her a valgus displacement of her knee with severe underlying osteoarthritis.  Surgical options would require an extensive surgical procedure that I feel carries with it significant risk.  These include wound healing based on her body habitus and her underlying diabetes.  Inability to anticoagulate postoperatively due to her hemorrhagic gastritis history.  Open wounds in her abdomen and side increasing risk of infection.  The other option is to continue conservative pillow splints nonweightbearing assisted transfers.  See if we can get bony union.  That would be the safer of the 2 however with the resultant deformity she very well may be wheelchair-bound      . .  I had a phone conversation with the patient as well.  Describing the new x-rays showing the callus formation with partial healing and that the femoral condyle is displaced.  And underlying severe osteoarthritis.  Benefit healed in this position she would have a leg deformity like a knock-knee that may even preclude her ambulation.  And she may be wheelchair-bound versus an operative takedown of the nonunion or partial union and ORIF in the context of significant operative risk that includes healing due to her diabetes and other nonhealing wounds that she has on her abdomen and on her side.  Recent UTI as well as hemorrhagic gastritis which is essentially preclude postoperative anticoagulation to decrease the risk of blood clot and pulmonary embolism.  Also her body habitus and significant adipose tissue in her thigh would also  add to the postoperative risk of wound healing.  The patient understands both scenarios.  She has to give some thought about it.  Consulted one of my partners who agrees with the 2 options.  For right now we will continue the conservative treatment pillow splints nonweightbearing protected transfers.  Patient reports that when she takes pain medicine I can control her pain.  Reports that she is resting comfortably are reassuring.  I may consult one of the trauma physicians to determine whether they have any other ideas.  In any event I would need to see her in 2 weeks for repeat x-rays and to see how she is doing.  I also called her sister and described the situation and the 2 scenarios.  She is not in favor of operative intervention at this point.  She will least have help in a rehab unit.  My cell phone number is [phone]  Difficult situation In addition even if the lateral condyle is fixed she still has severe osteoarthritis of the knee which may preclude ambulation.  And she is not a candidate for a total knee replacement.  Both the patient and her sister have my cell phone number for further consultation as necessary.    Revision History                     Note Details  Otis Brace, MD File Time 10/26/2019 12:35 PM  Author Type Physician Status Addendum  Last Editor Susa Day, MD  [place] # : [id number] Admit Date 10/23/2019

## 2019-10-26 NOTE — Progress Notes (Signed)
Orthopedic Tech Progress Note Patient Details:  [name] [name] [birth year] [id number]  Ortho Devices Type of Ortho Device: Knee Immobilizer Ortho Device/Splint Location: left Ortho Device/Splint Interventions: Application   Post Interventions Patient Tolerated: Well Instructions Provided: Care of device   [place] Pink 10/26/2019, 3:41 PM

## 2019-10-26 NOTE — ED Notes (Signed)
Patient remains aox4, no complaints, repeat femur x-ray completed, has bed @ [facility], early next week placement, Continue plan of care

## 2019-10-27 LAB — CBG MONITORING, ED
Glucose-Capillary: 176 mg/dL — ABNORMAL HIGH (ref 70–99)
Glucose-Capillary: 208 mg/dL — ABNORMAL HIGH (ref 70–99)
Glucose-Capillary: 181 mg/dL — ABNORMAL HIGH (ref 70–99)
Glucose-Capillary: 166 mg/dL — ABNORMAL HIGH (ref 70–99)

## 2019-10-27 NOTE — ED Notes (Signed)
Pt alert this shift. Calm, cooperative, no s.s of distress.  Wound dressing cleaned and changed.

## 2019-10-27 NOTE — ED Provider Notes (Signed)
Emergency Medicine Observation Re-evaluation Note  [name] [name] is a 64 y.o. female, seen on rounds today.  Pt initially presented to the ED for complaints of Well Check Currently, the patient is sleeping.  No issues overnight.  Physical Exam  BP (!) 92/46 (BP Location: Left Arm)   Pulse 89   Temp 98.1 F (36.7 C) (Oral)   Resp 14   Ht 5\' 2"  (1.575 m)   Wt (!) 150 kg   SpO2 95%   BMI 60.48 kg/m  Physical Exam General: sleeping Cardiac: normal rate Lungs: no increased WOB, normal rate, no audible abnormal lung sounds Psych: sleeping  ED Course / MDM  EKG:  Clinical Course as of Oct 27 806  Wed Oct 23, 2019  2028 Delay in obtaining labs as patient is a difficult stick, IV team consult ordered   [KA]    Clinical Course User Index [KA] Albrizze, Harley Hallmark, PA-C   I have reviewed the labs performed to date as well as medications administered while in observation.  Recent changes in the last 24 hours include no events.  Plan  Current plan is for awaiting SNF rehab placement.  Ortho has seen the pt and plan is for conservative, non-operative treatment of fracture at this point.  SCDs in place.  Unable to do anticoagulants due to prior GI bleeding. Patient is not under full IVC at this time.   Malvin Johns, MD 10/27/19 [phone]

## 2019-10-27 NOTE — Progress Notes (Signed)
Plan nonoperative care. Discussed with patient and family.  NWB pillow splints. Bed to chair stabilizing left knee. Need To rexray in three weeks. Most likely patient will be wheel chair bound even with healing. Comorbidities preclude operative treatment.

## 2019-10-27 NOTE — Progress Notes (Signed)
CSW spoke with [name] [name] at [facility] who states the patient's insurance authorization has been initiated and is still pending. [facility] is closed for the holiday weekend and will reopen on Tuesday 9/7.  TOC staff to follow for discharge planning.  Madilyn Fireman, MSW, LCSW-A Transitions of Care  Clinical Social Worker  [facility] Emergency Departments  Medical ICU [phone]

## 2019-10-28 LAB — CBG MONITORING, ED
Glucose-Capillary: 172 mg/dL — ABNORMAL HIGH (ref 70–99)
Glucose-Capillary: 172 mg/dL — ABNORMAL HIGH (ref 70–99)
Glucose-Capillary: 195 mg/dL — ABNORMAL HIGH (ref 70–99)

## 2019-10-28 NOTE — ED Notes (Signed)
Patient sleeping/rise and fall of chest breathing observed 

## 2019-10-28 NOTE — ED Notes (Signed)
Patient received dinner tray 

## 2019-10-28 NOTE — ED Provider Notes (Signed)
Emergency Medicine Observation Re-evaluation Note  [name] [name] is a 63 y.o. female, seen on rounds today.  Pt initially presented to the ED for complaints of Well Check Currently, the patient is awaiting nursing home placement  Physical Exam  BP (!) 115/57 (BP Location: Left Arm)   Pulse 80   Temp 98.8 F (37.1 C) (Oral)   Resp 16   Ht 5\' 2"  (1.575 m)   Wt (!) 150 kg   SpO2 94%   BMI 60.48 kg/m  Physical Exam General: Resting but easily arousable Cardiac: Regular Lungs: Clear   ED Course / MDM  EKG:  Clinical Course as of Oct 28 1006  Wed Oct 23, 2019  2028 Delay in obtaining labs as patient is a difficult stick, IV team consult ordered   [KA]    Clinical Course User Index [KA] Albrizze, Harley Hallmark, PA-C   I have reviewed the labs performed to date as well as medications administered while in observation.  Recent changes in the last 24 hours include none  Plan  Current plan is for nursing home placement    Milton Ferguson, MD 10/28/19 1009
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
## 2019-10-28 NOTE — ED Notes (Signed)
Patients sister arrived for visit

## 2019-10-28 NOTE — Progress Notes (Signed)
TOC CSW spoke with Tammy/[facility] (336) [phone].  Tammy states due to the holiday weekend, [facility] will not reopen until Tuesday, 10/29/2019 therefore she unable to obtain auth currently.  CSW will continue to follow for dc needs.  Leota Maka Tarpley-Carter, MSW, [place] ED Transitions of CareClinical Social Worker [email or web address] [phone]

## 2019-10-28 NOTE — ED Notes (Signed)
Physical Therapy here with patient.

## 2019-10-28 NOTE — ED Notes (Signed)
Clam, cooperative, resting.

## 2019-10-28 NOTE — ED Notes (Signed)
Patient was cleaned after BM.  Patient is calm and cooperative and in no distress.   Inter Dry was applied as needed.

## 2019-10-28 NOTE — ED Notes (Signed)
Dr. Laurie Panda patient to use own lamisil cream.

## 2019-10-28 NOTE — ED Notes (Signed)
Patients sister has left

## 2019-10-29 LAB — SARS CORONAVIRUS 2 BY RT PCR (HOSPITAL ORDER, PERFORMED IN ~~LOC~~ HOSPITAL LAB): SARS Coronavirus 2: NEGATIVE

## 2019-10-29 LAB — CBG MONITORING, ED
Glucose-Capillary: 170 mg/dL — ABNORMAL HIGH (ref 70–99)
Glucose-Capillary: 184 mg/dL — ABNORMAL HIGH (ref 70–99)

## 2019-10-29 MED ORDER — ALPRAZOLAM 1 MG PO TABS: 1.0000 mg | ORAL_TABLET | Freq: Three times a day (TID) | ORAL | 0 refills | Status: DC | PRN

## 2019-10-29 MED ORDER — OXYCODONE-ACETAMINOPHEN 10-325 MG PO TABS
1.0000 | ORAL_TABLET | Freq: Three times a day (TID) | ORAL | 0 refills | Status: DC | PRN
Start: 2019-10-29 — End: 2019-11-23

## 2019-10-29 NOTE — Progress Notes (Signed)
TOC CM spoke to [place], [place] Admission Coordinator. States she is getting push back with [employer] in completing Auth. Per [facility] they are waiving auth process and needed information faxed to Tammy to support request to waive auth process. Faxed AVS, Covid test, and ED provider progress note. Pt's room # 119, [id number], pt will need to be at facility before 230 pm. Contacted [facility] with request. Jonnie Finner RN [place], [place] ED TOC CM [phone]

## 2019-10-29 NOTE — ED Notes (Signed)
Pt off unit to [facility] . Pt alert, calm. Cooperative, no s/s of distress. DC information and belongings given to P tar for facility. Pt off unit on stretcher.  Pt escorted and transported by P Tar.

## 2019-10-29 NOTE — Progress Notes (Signed)
TOC CSW spoke with Tammy/[facility] (336) [phone].  She received pts COVID card per pts permission.  Tammy has requested a COVID test.  CSW reached out to [place], RN/TCU for that order.  CSW will continue to follow for dc needs.  Boss Danielsen Tarpley-Carter, MSW, LCSW-A                  [facility] ED Transitions of CareClinical Social Worker [email or web address] [phone]

## 2019-10-29 NOTE — Discharge Instructions (Signed)
FOR WOUND CARE: Saline moistened gauze to be placed into abdominal wounds. Cover with dry gauze or ABD pad(s), tape in place. To be done once or twice daily. For the MASD-ITD below the breasts, use InterDry textile to wick moisture away.  Foam dressing to sacral stage 2 wound; change every 3 days and prn.   GENERAL INSTRUCTIONS: continue home medications, follow up with a primary care doctor in the next couple days regarding ongoing management of multiple medical comorbidities.

## 2019-10-29 NOTE — ED Provider Notes (Addendum)
Emergency Medicine Observation Re-evaluation Note  [name] [name] is a 64 y.o. female, seen on rounds today.  Pt initially presented to the ED for complaints of Well Check Currently, the patient is resting comfortably in bed.  Physical Exam  BP (!) 100/50 (BP Location: Left Arm)   Pulse 77   Temp 98.4 F (36.9 C) (Oral)   Resp 16   Ht 5\' 2"  (1.575 m)   Wt (!) 150 kg   SpO2 94%   BMI 60.48 kg/m  Physical Exam General: Resting but easily arousable Cardiac: Regular Lungs: even unlabored Psych: calm  ED Course / MDM  EKG:  Clinical Course as of Oct 28 901  Wed Oct 23, 2019  2028 Delay in obtaining labs as patient is a difficult stick, IV team consult ordered   [KA]    Clinical Course User Index [KA] Albrizze, Harley Hallmark, PA-C   I have reviewed the labs performed to date as well as medications administered while in observation.  Recent changes in the last 24 hours include CM/SW working on placement, requested covid swab.  Plan  Current plan is for placement per CM/SW. Covid swab for placement    Lucrezia Starch, MD 10/29/19 [phone]   [facility] - HOSPITAL AND EMERGENCY ROOM ARE AT FULL CAPACITY, PATIENT NEEDS TO BE PLACED IN SNF TODAY. PATIENT TO GO TO [facility].    Lucrezia Starch, MD 10/29/19 1122

## 2019-11-05 ENCOUNTER — Emergency Department (HOSPITAL_COMMUNITY): Payer: No Typology Code available for payment source

## 2019-11-05 ENCOUNTER — Other Ambulatory Visit: Payer: Self-pay

## 2019-11-05 ENCOUNTER — Inpatient Hospital Stay (HOSPITAL_COMMUNITY)
Admission: EM | Admit: 2019-11-05 | Discharge: 2019-11-23 | DRG: 673 | Disposition: A | Discharge status: Skilled Nursing Facility | Payer: No Typology Code available for payment source | Source: Skilled Nursing Facility | Attending: Internal Medicine | Admitting: Internal Medicine

## 2019-11-05 ENCOUNTER — Encounter (HOSPITAL_COMMUNITY): Payer: Self-pay

## 2019-11-05 DIAGNOSIS — K922 Gastrointestinal hemorrhage, unspecified: Secondary | ICD-10-CM | POA: Diagnosis present

## 2019-11-05 DIAGNOSIS — M109 Gout, unspecified: Secondary | ICD-10-CM | POA: Diagnosis present

## 2019-11-05 DIAGNOSIS — E1140 Type 2 diabetes mellitus with diabetic neuropathy, unspecified: Secondary | ICD-10-CM | POA: Diagnosis present

## 2019-11-05 DIAGNOSIS — Z95828 Presence of other vascular implants and grafts: Secondary | ICD-10-CM

## 2019-11-05 DIAGNOSIS — K30 Functional dyspepsia: Secondary | ICD-10-CM | POA: Diagnosis not present

## 2019-11-05 DIAGNOSIS — I130 Hypertensive heart and chronic kidney disease with heart failure and stage 1 through stage 4 chronic kidney disease, or unspecified chronic kidney disease: Secondary | ICD-10-CM | POA: Diagnosis present

## 2019-11-05 DIAGNOSIS — Z79899 Other long term (current) drug therapy: Secondary | ICD-10-CM

## 2019-11-05 DIAGNOSIS — K5730 Diverticulosis of large intestine without perforation or abscess without bleeding: Secondary | ICD-10-CM | POA: Diagnosis present

## 2019-11-05 DIAGNOSIS — N179 Acute kidney failure, unspecified: Secondary | ICD-10-CM | POA: Diagnosis not present

## 2019-11-05 DIAGNOSIS — Z880 Allergy status to penicillin: Secondary | ICD-10-CM

## 2019-11-05 DIAGNOSIS — L89620 Pressure ulcer of left heel, unstageable: Secondary | ICD-10-CM | POA: Diagnosis present

## 2019-11-05 DIAGNOSIS — D62 Acute posthemorrhagic anemia: Secondary | ICD-10-CM | POA: Diagnosis not present

## 2019-11-05 DIAGNOSIS — W19XXXD Unspecified fall, subsequent encounter: Secondary | ICD-10-CM | POA: Diagnosis present

## 2019-11-05 DIAGNOSIS — Z87891 Personal history of nicotine dependence: Secondary | ICD-10-CM

## 2019-11-05 DIAGNOSIS — R4182 Altered mental status, unspecified: Secondary | ICD-10-CM

## 2019-11-05 DIAGNOSIS — R197 Diarrhea, unspecified: Secondary | ICD-10-CM | POA: Diagnosis not present

## 2019-11-05 DIAGNOSIS — I5031 Acute diastolic (congestive) heart failure: Secondary | ICD-10-CM | POA: Diagnosis present

## 2019-11-05 DIAGNOSIS — I5033 Acute on chronic diastolic (congestive) heart failure: Secondary | ICD-10-CM | POA: Diagnosis present

## 2019-11-05 DIAGNOSIS — L8990 Pressure ulcer of unspecified site, unspecified stage: Secondary | ICD-10-CM | POA: Diagnosis present

## 2019-11-05 DIAGNOSIS — R112 Nausea with vomiting, unspecified: Secondary | ICD-10-CM

## 2019-11-05 DIAGNOSIS — N183 Chronic kidney disease, stage 3 unspecified: Secondary | ICD-10-CM | POA: Diagnosis present

## 2019-11-05 DIAGNOSIS — G9341 Metabolic encephalopathy: Secondary | ICD-10-CM | POA: Diagnosis present

## 2019-11-05 DIAGNOSIS — Z20822 Contact with and (suspected) exposure to covid-19: Secondary | ICD-10-CM | POA: Diagnosis present

## 2019-11-05 DIAGNOSIS — T502X5A Adverse effect of carbonic-anhydrase inhibitors, benzothiadiazides and other diuretics, initial encounter: Secondary | ICD-10-CM | POA: Diagnosis not present

## 2019-11-05 DIAGNOSIS — E860 Dehydration: Secondary | ICD-10-CM | POA: Diagnosis present

## 2019-11-05 DIAGNOSIS — L89610 Pressure ulcer of right heel, unstageable: Secondary | ICD-10-CM | POA: Diagnosis present

## 2019-11-05 DIAGNOSIS — E876 Hypokalemia: Secondary | ICD-10-CM | POA: Diagnosis present

## 2019-11-05 DIAGNOSIS — E119 Type 2 diabetes mellitus without complications: Secondary | ICD-10-CM | POA: Diagnosis present

## 2019-11-05 DIAGNOSIS — N184 Chronic kidney disease, stage 4 (severe): Secondary | ICD-10-CM | POA: Diagnosis present

## 2019-11-05 DIAGNOSIS — R578 Other shock: Secondary | ICD-10-CM | POA: Diagnosis present

## 2019-11-05 DIAGNOSIS — Z8719 Personal history of other diseases of the digestive system: Secondary | ICD-10-CM

## 2019-11-05 DIAGNOSIS — Z885 Allergy status to narcotic agent status: Secondary | ICD-10-CM

## 2019-11-05 DIAGNOSIS — N170 Acute kidney failure with tubular necrosis: Principal | ICD-10-CM | POA: Diagnosis present

## 2019-11-05 DIAGNOSIS — N898 Other specified noninflammatory disorders of vagina: Secondary | ICD-10-CM | POA: Diagnosis present

## 2019-11-05 DIAGNOSIS — Z7984 Long term (current) use of oral hypoglycemic drugs: Secondary | ICD-10-CM

## 2019-11-05 DIAGNOSIS — D631 Anemia in chronic kidney disease: Secondary | ICD-10-CM | POA: Diagnosis present

## 2019-11-05 DIAGNOSIS — Z833 Family history of diabetes mellitus: Secondary | ICD-10-CM

## 2019-11-05 DIAGNOSIS — E1122 Type 2 diabetes mellitus with diabetic chronic kidney disease: Secondary | ICD-10-CM | POA: Diagnosis present

## 2019-11-05 DIAGNOSIS — I5032 Chronic diastolic (congestive) heart failure: Secondary | ICD-10-CM | POA: Diagnosis present

## 2019-11-05 DIAGNOSIS — S72422P Displaced fracture of lateral condyle of left femur, subsequent encounter for closed fracture with malunion: Secondary | ICD-10-CM

## 2019-11-05 DIAGNOSIS — Z79891 Long term (current) use of opiate analgesic: Secondary | ICD-10-CM

## 2019-11-05 DIAGNOSIS — K264 Chronic or unspecified duodenal ulcer with hemorrhage: Secondary | ICD-10-CM | POA: Diagnosis present

## 2019-11-05 DIAGNOSIS — F4310 Post-traumatic stress disorder, unspecified: Secondary | ICD-10-CM | POA: Diagnosis present

## 2019-11-05 DIAGNOSIS — E11649 Type 2 diabetes mellitus with hypoglycemia without coma: Secondary | ICD-10-CM | POA: Diagnosis not present

## 2019-11-05 DIAGNOSIS — Z6841 Body Mass Index (BMI) 40.0 and over, adult: Secondary | ICD-10-CM

## 2019-11-05 DIAGNOSIS — E785 Hyperlipidemia, unspecified: Secondary | ICD-10-CM | POA: Diagnosis present

## 2019-11-05 DIAGNOSIS — F329 Major depressive disorder, single episode, unspecified: Secondary | ICD-10-CM | POA: Diagnosis present

## 2019-11-05 DIAGNOSIS — N390 Urinary tract infection, site not specified: Secondary | ICD-10-CM | POA: Diagnosis present

## 2019-11-05 LAB — CBG MONITORING, ED: Glucose-Capillary: 127 mg/dL — ABNORMAL HIGH (ref 70–99)

## 2019-11-05 LAB — COMPREHENSIVE METABOLIC PANEL
ALT: 17 U/L (ref 0–44)
AST: 17 U/L (ref 15–41)
Albumin: 3.1 g/dL — ABNORMAL LOW (ref 3.5–5.0)
Alkaline Phosphatase: 104 U/L (ref 38–126)
Anion gap: 14 (ref 5–15)
BUN: 62 mg/dL — ABNORMAL HIGH (ref 8–23)
CO2: 25 mmol/L (ref 22–32)
Calcium: 9.3 mg/dL (ref 8.9–10.3)
Chloride: 100 mmol/L (ref 98–111)
Creatinine, Ser: 6.22 mg/dL — ABNORMAL HIGH (ref 0.44–1.00)
GFR calc Af Amer: 7 mL/min — ABNORMAL LOW (ref >60)
GFR calc non Af Amer: 6 mL/min — ABNORMAL LOW (ref >60)
Glucose, Bld: 116 mg/dL — ABNORMAL HIGH (ref 70–99)
Potassium: 5.1 mmol/L (ref 3.5–5.1)
Sodium: 139 mmol/L (ref 135–145)
Total Bilirubin: 0.4 mg/dL (ref 0.3–1.2)
Total Protein: 6.5 g/dL (ref 6.5–8.1)

## 2019-11-05 LAB — BLOOD GAS, ARTERIAL
Acid-base deficit: 1.1 mmol/L (ref 0.0–2.0)
Bicarbonate: 25.1 mmol/L (ref 20.0–28.0)
O2 Saturation: 90.8 %
Patient temperature: 97.8
pCO2 arterial: 52.2 mmHg — ABNORMAL HIGH (ref 32.0–48.0)
pH, Arterial: 7.301 — ABNORMAL LOW (ref 7.350–7.450)
pO2, Arterial: 68.5 mmHg — ABNORMAL LOW (ref 83.0–108.0)

## 2019-11-05 LAB — CBC
HCT: 30.2 % — ABNORMAL LOW (ref 36.0–46.0)
Hemoglobin: 9 g/dL — ABNORMAL LOW (ref 12.0–15.0)
MCH: 25.2 pg — ABNORMAL LOW (ref 26.0–34.0)
MCHC: 29.8 g/dL — ABNORMAL LOW (ref 30.0–36.0)
MCV: 84.6 fL (ref 80.0–100.0)
Platelets: 693 10*3/uL — ABNORMAL HIGH (ref 150–400)
RBC: 3.57 MIL/uL — ABNORMAL LOW (ref 3.87–5.11)
RDW: 17 % — ABNORMAL HIGH (ref 11.5–15.5)
WBC: 8 10*3/uL (ref 4.0–10.5)
nRBC: 0.9 % — ABNORMAL HIGH (ref 0.0–0.2)

## 2019-11-05 LAB — SARS CORONAVIRUS 2 BY RT PCR (HOSPITAL ORDER, PERFORMED IN ~~LOC~~ HOSPITAL LAB): SARS Coronavirus 2: NEGATIVE

## 2019-11-05 LAB — AMMONIA: Ammonia: 36 umol/L — ABNORMAL HIGH (ref 9–35)

## 2019-11-05 MED ORDER — SODIUM CHLORIDE 0.9 % IV BOLUS: 500.0000 mL | Freq: Once | INTRAVENOUS | Status: DC

## 2019-11-05 MED ORDER — SODIUM CHLORIDE 0.9 % IV BOLUS
500.0000 mL | Freq: Once | INTRAVENOUS | Status: AC
  Administered 2019-11-05: 500 mL via INTRAVENOUS
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
## 2019-11-05 NOTE — ED Triage Notes (Signed)
EMS reports from [facility], staffed called out for Pt not responding to verbal stimuli, Pt states she was sleeping and woke up with sternal rub acting at baseline. Pt states she has no complaints.  BP 138/60 HR 70 RR 20 Sp02 97 RA CBG 112
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
## 2019-11-06 ENCOUNTER — Inpatient Hospital Stay (HOSPITAL_COMMUNITY): Payer: No Typology Code available for payment source

## 2019-11-06 DIAGNOSIS — M109 Gout, unspecified: Secondary | ICD-10-CM | POA: Diagnosis present

## 2019-11-06 DIAGNOSIS — R112 Nausea with vomiting, unspecified: Secondary | ICD-10-CM | POA: Diagnosis not present

## 2019-11-06 DIAGNOSIS — Z20822 Contact with and (suspected) exposure to covid-19: Secondary | ICD-10-CM | POA: Diagnosis present

## 2019-11-06 DIAGNOSIS — E1122 Type 2 diabetes mellitus with diabetic chronic kidney disease: Secondary | ICD-10-CM | POA: Diagnosis not present

## 2019-11-06 DIAGNOSIS — K922 Gastrointestinal hemorrhage, unspecified: Secondary | ICD-10-CM | POA: Diagnosis not present

## 2019-11-06 DIAGNOSIS — I130 Hypertensive heart and chronic kidney disease with heart failure and stage 1 through stage 4 chronic kidney disease, or unspecified chronic kidney disease: Secondary | ICD-10-CM | POA: Diagnosis present

## 2019-11-06 DIAGNOSIS — K5730 Diverticulosis of large intestine without perforation or abscess without bleeding: Secondary | ICD-10-CM | POA: Diagnosis present

## 2019-11-06 DIAGNOSIS — N898 Other specified noninflammatory disorders of vagina: Secondary | ICD-10-CM | POA: Diagnosis present

## 2019-11-06 DIAGNOSIS — D62 Acute posthemorrhagic anemia: Secondary | ICD-10-CM | POA: Diagnosis present

## 2019-11-06 DIAGNOSIS — R578 Other shock: Secondary | ICD-10-CM | POA: Diagnosis present

## 2019-11-06 DIAGNOSIS — N184 Chronic kidney disease, stage 4 (severe): Secondary | ICD-10-CM | POA: Diagnosis present

## 2019-11-06 DIAGNOSIS — K264 Chronic or unspecified duodenal ulcer with hemorrhage: Secondary | ICD-10-CM | POA: Diagnosis present

## 2019-11-06 DIAGNOSIS — N1832 Chronic kidney disease, stage 3b: Secondary | ICD-10-CM | POA: Diagnosis not present

## 2019-11-06 DIAGNOSIS — I5031 Acute diastolic (congestive) heart failure: Secondary | ICD-10-CM | POA: Diagnosis present

## 2019-11-06 DIAGNOSIS — E876 Hypokalemia: Secondary | ICD-10-CM | POA: Diagnosis present

## 2019-11-06 DIAGNOSIS — E1140 Type 2 diabetes mellitus with diabetic neuropathy, unspecified: Secondary | ICD-10-CM | POA: Diagnosis present

## 2019-11-06 DIAGNOSIS — N179 Acute kidney failure, unspecified: Secondary | ICD-10-CM

## 2019-11-06 DIAGNOSIS — G9341 Metabolic encephalopathy: Secondary | ICD-10-CM

## 2019-11-06 DIAGNOSIS — Z6841 Body Mass Index (BMI) 40.0 and over, adult: Secondary | ICD-10-CM | POA: Diagnosis not present

## 2019-11-06 DIAGNOSIS — L89610 Pressure ulcer of right heel, unstageable: Secondary | ICD-10-CM | POA: Diagnosis present

## 2019-11-06 DIAGNOSIS — E11649 Type 2 diabetes mellitus with hypoglycemia without coma: Secondary | ICD-10-CM | POA: Diagnosis not present

## 2019-11-06 DIAGNOSIS — E785 Hyperlipidemia, unspecified: Secondary | ICD-10-CM | POA: Diagnosis present

## 2019-11-06 DIAGNOSIS — L89620 Pressure ulcer of left heel, unstageable: Secondary | ICD-10-CM | POA: Diagnosis present

## 2019-11-06 DIAGNOSIS — S72422P Displaced fracture of lateral condyle of left femur, subsequent encounter for closed fracture with malunion: Secondary | ICD-10-CM | POA: Diagnosis not present

## 2019-11-06 DIAGNOSIS — G9340 Encephalopathy, unspecified: Secondary | ICD-10-CM | POA: Diagnosis not present

## 2019-11-06 DIAGNOSIS — N170 Acute kidney failure with tubular necrosis: Secondary | ICD-10-CM | POA: Diagnosis present

## 2019-11-06 DIAGNOSIS — E860 Dehydration: Secondary | ICD-10-CM | POA: Diagnosis present

## 2019-11-06 DIAGNOSIS — N390 Urinary tract infection, site not specified: Secondary | ICD-10-CM | POA: Diagnosis present

## 2019-11-06 DIAGNOSIS — W19XXXD Unspecified fall, subsequent encounter: Secondary | ICD-10-CM | POA: Diagnosis present

## 2019-11-06 HISTORY — PX: IR FLUORO GUIDE CV LINE RIGHT: IMG2283

## 2019-11-06 HISTORY — PX: IR US GUIDE VASC ACCESS RIGHT: IMG2390

## 2019-11-06 LAB — URINALYSIS, ROUTINE W REFLEX MICROSCOPIC
Bilirubin Urine: NEGATIVE
Glucose, UA: NEGATIVE mg/dL
Ketones, ur: NEGATIVE mg/dL
Nitrite: NEGATIVE
Protein, ur: 30 mg/dL — AB
Specific Gravity, Urine: 1.017 (ref 1.005–1.030)
WBC, UA: >50 WBC/hpf — ABNORMAL HIGH (ref 0–5)
pH: 5 (ref 5.0–8.0)

## 2019-11-06 LAB — CBG MONITORING, ED
Glucose-Capillary: 85 mg/dL (ref 70–99)
Glucose-Capillary: 84 mg/dL (ref 70–99)
Glucose-Capillary: 89 mg/dL (ref 70–99)
Glucose-Capillary: 95 mg/dL (ref 70–99)

## 2019-11-06 LAB — CBC
HCT: 30.5 % — ABNORMAL LOW (ref 36.0–46.0)
Hemoglobin: 9.1 g/dL — ABNORMAL LOW (ref 12.0–15.0)
MCH: 25.1 pg — ABNORMAL LOW (ref 26.0–34.0)
MCHC: 29.8 g/dL — ABNORMAL LOW (ref 30.0–36.0)
MCV: 84 fL (ref 80.0–100.0)
Platelets: 540 10*3/uL — ABNORMAL HIGH (ref 150–400)
RBC: 3.63 MIL/uL — ABNORMAL LOW (ref 3.87–5.11)
RDW: 16.8 % — ABNORMAL HIGH (ref 11.5–15.5)
WBC: 7.4 10*3/uL (ref 4.0–10.5)
nRBC: 0.7 % — ABNORMAL HIGH (ref 0.0–0.2)

## 2019-11-06 LAB — COMPREHENSIVE METABOLIC PANEL
ALT: 15 U/L (ref 0–44)
AST: 14 U/L — ABNORMAL LOW (ref 15–41)
Albumin: 2.7 g/dL — ABNORMAL LOW (ref 3.5–5.0)
Alkaline Phosphatase: 94 U/L (ref 38–126)
Anion gap: 13 (ref 5–15)
BUN: 58 mg/dL — ABNORMAL HIGH (ref 8–23)
CO2: 24 mmol/L (ref 22–32)
Calcium: 8.5 mg/dL — ABNORMAL LOW (ref 8.9–10.3)
Chloride: 101 mmol/L (ref 98–111)
Creatinine, Ser: 6.18 mg/dL — ABNORMAL HIGH (ref 0.44–1.00)
GFR calc Af Amer: 8 mL/min — ABNORMAL LOW (ref >60)
GFR calc non Af Amer: 6 mL/min — ABNORMAL LOW (ref >60)
Glucose, Bld: 114 mg/dL — ABNORMAL HIGH (ref 70–99)
Potassium: 4.8 mmol/L (ref 3.5–5.1)
Sodium: 138 mmol/L (ref 135–145)
Total Bilirubin: 0.4 mg/dL (ref 0.3–1.2)
Total Protein: 6 g/dL — ABNORMAL LOW (ref 6.5–8.1)

## 2019-11-06 LAB — PROTIME-INR
INR: 1 (ref 0.8–1.2)
Prothrombin Time: 13.1 seconds (ref 11.4–15.2)

## 2019-11-06 LAB — BRAIN NATRIURETIC PEPTIDE: B Natriuretic Peptide: 608.6 pg/mL — ABNORMAL HIGH (ref 0.0–100.0)

## 2019-11-06 LAB — TSH: TSH: 4.236 u[IU]/mL (ref 0.350–4.500)

## 2019-11-06 LAB — LACTIC ACID, PLASMA: Lactic Acid, Venous: 0.8 mmol/L (ref 0.5–1.9)

## 2019-11-06 LAB — GLUCOSE, CAPILLARY
Glucose-Capillary: 92 mg/dL (ref 70–99)
Glucose-Capillary: 88 mg/dL (ref 70–99)

## 2019-11-06 LAB — HEMOGLOBIN A1C
Hgb A1c MFr Bld: 7.3 % — ABNORMAL HIGH (ref 4.8–5.6)
Mean Plasma Glucose: 162.81 mg/dL

## 2019-11-06 MED ORDER — ATORVASTATIN CALCIUM 10 MG PO TABS
10.0000 mg | ORAL_TABLET | Freq: Every day | ORAL | Status: DC
  Administered 2019-11-06 – 2019-11-23 (×15): 10 mg via ORAL
  Filled 2019-11-06 (×17): qty 1

## 2019-11-06 MED ORDER — MORPHINE SULFATE (PF) 2 MG/ML IV SOLN: 0.5000 mg | INTRAVENOUS | Status: DC | PRN

## 2019-11-06 MED ORDER — SODIUM CHLORIDE 0.9% FLUSH
10.0000 mL | INTRAVENOUS | Status: DC | PRN
  Administered 2019-11-20: 10 mL

## 2019-11-06 MED ORDER — ALBUTEROL SULFATE (2.5 MG/3ML) 0.083% IN NEBU: 2.5000 mg | INHALATION_SOLUTION | RESPIRATORY_TRACT | Status: DC | PRN

## 2019-11-06 MED ORDER — SODIUM CHLORIDE 0.9 % IV SOLN: INTRAVENOUS | Status: DC

## 2019-11-06 MED ORDER — CHLORHEXIDINE GLUCONATE CLOTH 2 % EX PADS
6.0000 | MEDICATED_PAD | Freq: Every day | CUTANEOUS | Status: DC
  Administered 2019-11-06 – 2019-11-21 (×15): 6 via TOPICAL

## 2019-11-06 MED ORDER — ACETAMINOPHEN 325 MG PO TABS
650.0000 mg | ORAL_TABLET | Freq: Four times a day (QID) | ORAL | Status: DC | PRN
  Administered 2019-11-07: 650 mg via ORAL
  Filled 2019-11-06 (×3): qty 2

## 2019-11-06 MED ORDER — LIDOCAINE-EPINEPHRINE 1 %-1:100000 IJ SOLN
INTRAMUSCULAR | Status: AC
  Filled 2019-11-06: qty 1

## 2019-11-06 MED ORDER — SENNA 8.6 MG PO TABS
1.0000 | ORAL_TABLET | Freq: Every day | ORAL | Status: DC
  Administered 2019-11-08: 8.6 mg via ORAL
  Filled 2019-11-06 (×2): qty 1

## 2019-11-06 MED ORDER — SODIUM CHLORIDE 0.45 % IV SOLN: INTRAVENOUS | Status: AC

## 2019-11-06 MED ORDER — SODIUM CHLORIDE 0.9 % IV SOLN
1.0000 g | INTRAVENOUS | Status: DC
  Administered 2019-11-07 – 2019-11-08 (×2): 1 g via INTRAVENOUS
  Filled 2019-11-06: qty 1
  Filled 2019-11-06 (×2): qty 10

## 2019-11-06 MED ORDER — METRONIDAZOLE IN NACL 5-0.79 MG/ML-% IV SOLN
500.0000 mg | Freq: Three times a day (TID) | INTRAVENOUS | Status: DC
  Administered 2019-11-06 – 2019-11-11 (×15): 500 mg via INTRAVENOUS
  Filled 2019-11-06 (×13): qty 100

## 2019-11-06 MED ORDER — ACETAMINOPHEN 650 MG RE SUPP: 650.0000 mg | Freq: Four times a day (QID) | RECTAL | Status: DC | PRN

## 2019-11-06 MED ORDER — HEPARIN SODIUM (PORCINE) 5000 UNIT/ML IJ SOLN
5000.0000 [IU] | Freq: Three times a day (TID) | INTRAMUSCULAR | Status: DC
  Administered 2019-11-06 – 2019-11-10 (×14): 5000 [IU] via SUBCUTANEOUS
  Filled 2019-11-06 (×12): qty 1

## 2019-11-06 MED ORDER — INSULIN ASPART 100 UNIT/ML ~~LOC~~ SOLN
0.0000 [IU] | Freq: Three times a day (TID) | SUBCUTANEOUS | Status: DC
  Filled 2019-11-06: qty 0.15

## 2019-11-06 NOTE — ED Notes (Signed)
Patient still does not have IV access. Dr. Vallery Ridge notified around 4am. MD placed order for PICC or Midline to be placed. IV team was called. At this point they have not come in to see patient. RN contacted Dr.Mujtaba via message and asked for central line to be ordered. Order to given for RT  To obtain blood sample via ABG

## 2019-11-06 NOTE — ED Notes (Signed)
Floor nurse Megan informed patient will go to IR before coming upstairs.

## 2019-11-06 NOTE — Progress Notes (Signed)
PROGRESS NOTE  [name] [name] RCB:[id number] DOB: 02-Mar-1955 DOA: 11/05/2019 PCP: [name] Junior, MD  HPI/Recap of past 38 hours: 64 year old female with past medical history of morbid obesity, hypertension, diabetes mellitus and stage IV chronic kidney disease who was sent over to the emergency room from her skilled nursing facility on evening of 9/14 for confusion.  Reportedly, this had started earlier in the day when patient was found be unresponsive to verbal stimuli subsequently incoherent.  In the emergency room, she improved spontaneously and responded to questions appropriately.  In the emergency room, found to have marked acute kidney injury with her baseline creatinine around 2.8 and was at 6.22 as well as a large urinary tract infection.  Patient started on IV fluids and antibiotics.  Also noted to have some vaginal discharge and started on Flagyl.  Assessment/Plan: Principal Problem:   Metabolic encephalopathy: Looks to have resolved, secondary dehydration from UTI. Active Problems:   Acute renal failure (HCC)/acute kidney injury in the setting of stage IV chronic kidney disease: Likely in part from dehydration from UTI and possibly hypotension from home medications.  IV fluids and IV Rocephin.  Follow labs.   Diabetes type 2, uncontrolled ([name] [name]): Follow CBGs  Vaginal discharge: On Flagyl.    Morbid obesity [name] [name] [facility]): Patient is criteria BMI greater than 40.  Urinary tract infection: Underlying issue.  Sepsis ruled out.  Does not meet SIRS criteria.  IV Rocephin.   Code Status: Full code  Family Communication: Left message for son  Disposition Plan: Return back to skilled nursing next few days once renal function better normalized   Consultants:  None  Procedures:  None  Antimicrobials:  IV Rocephin 9/14-present  DVT prophylaxis: Subcu heparin   Objective: Vitals:   11/06/19 1145 11/06/19 1430  BP: (!) 101/50 (!) 113/54  Pulse: (!) 56   Resp: 15 17   Temp:    SpO2: 100%    No intake or output data in the 24 hours ending 11/06/19 1501 There were no vitals filed for this visit. There is no height or weight on file to calculate BMI.  Exam:   General: Currently resting comfortably, previously alert and oriented x3, no acute distress  HEENT: Normocephalic and atraumatic, mucous membranes dry  Neck: Thick, narrow airway  Cardiovascular: Regular rhythm, borderline bradycardia  Respiratory: Decreased breath sounds throughout secondary to body habitus  Abdomen: Soft, obese, nontender, hypoactive bowel sounds  Musculoskeletal: No clubbing or cyanosis, 1-2+ pitting edema bilaterally  Neuro: No focal deficits  Psychiatry: Appears appropriate, no evidence of psychoses   Data Reviewed: CBC: Recent Labs  Lab 11/05/19 2216 11/06/19 0613  WBC 8.0 7.4  HGB 9.0* 9.1*  HCT 30.2* 30.5*  MCV 84.6 84.0  PLT 693* 803*   Basic Metabolic Panel: Recent Labs  Lab 11/05/19 2216  NA 139  K 5.1  CL 100  CO2 25  GLUCOSE 116*  BUN 62*  CREATININE 6.22*  CALCIUM 9.3   GFR: Estimated Creatinine Clearance: 12.7 mL/min (A) (by C-G formula based on SCr of 6.22 mg/dL (H)). Liver Function Tests: Recent Labs  Lab 11/05/19 2216  AST 17  ALT 17  ALKPHOS 104  BILITOT 0.4  PROT 6.5  ALBUMIN 3.1*   No results for input(s): LIPASE, AMYLASE in the last 168 hours. Recent Labs  Lab 11/05/19 2216  AMMONIA 36*   Coagulation Profile: Recent Labs  Lab 11/06/19 0613  INR 1.0   Cardiac Enzymes: No results for input(s): CKTOTAL, CKMB, CKMBINDEX, TROPONINI in  the last 168 hours. BNP (last 3 results) No results for input(s): PROBNP in the last 8760 hours. HbA1C: Recent Labs    11/06/19 0613  HGBA1C 7.3*   CBG: Recent Labs  Lab 11/05/19 1827 11/06/19 0750 11/06/19 1045 11/06/19 1144  GLUCAP 127* 85 84 89   Lipid Profile: No results for input(s): CHOL, HDL, LDLCALC, TRIG, CHOLHDL, LDLDIRECT in the last 72 hours. Thyroid  Function Tests: Recent Labs    11/06/19 0029  TSH 4.236   Anemia Panel: No results for input(s): VITAMINB12, FOLATE, FERRITIN, TIBC, IRON, RETICCTPCT in the last 72 hours. Urine analysis:    Component Value Date/Time   COLORURINE AMBER (A) 11/06/2019 0130   APPEARANCEUR CLOUDY (A) 11/06/2019 0130   LABSPEC 1.017 11/06/2019 0130   PHURINE 5.0 11/06/2019 0130   GLUCOSEU NEGATIVE 11/06/2019 0130   HGBUR MODERATE (A) 11/06/2019 0130   BILIRUBINUR NEGATIVE 11/06/2019 0130   KETONESUR NEGATIVE 11/06/2019 0130   PROTEINUR 30 (A) 11/06/2019 0130   UROBILINOGEN 0.2 06/28/2014 1500   NITRITE NEGATIVE 11/06/2019 0130   LEUKOCYTESUR LARGE (A) 11/06/2019 0130   Sepsis Labs: @LABRCNTIP (procalcitonin:4,lacticidven:4)  ) Recent Results (from the past 240 hour(s))  SARS Coronavirus 2 by RT PCR (hospital order, performed in [facility] hospital lab) Nasopharyngeal Nasopharyngeal Swab     Status: None   Collection Time: 10/29/19  8:15 AM   Specimen: Nasopharyngeal Swab  Result Value Ref Range Status   SARS Coronavirus 2 NEGATIVE NEGATIVE Final    Comment: (NOTE) SARS-CoV-2 target nucleic acids are NOT DETECTED.  The SARS-CoV-2 RNA is generally detectable in upper and lower respiratory specimens during the acute phase of infection. The lowest concentration of SARS-CoV-2 viral copies this assay can detect is 250 copies / mL. A negative result does not preclude SARS-CoV-2 infection and should not be used as the sole basis for treatment or other patient management decisions.  A negative result may occur with improper specimen collection / handling, submission of specimen other than nasopharyngeal swab, presence of viral mutation(s) within the areas targeted by this assay, and inadequate number of viral copies (<250 copies / mL). A negative result must be combined with clinical observations, patient history, and epidemiological information.  Fact Sheet for Patients:    StrictlyIdeas.no  Fact Sheet for Healthcare Providers: BankingDealers.co.za  This test is not yet approved or  cleared by the [place] FDA and has been authorized for detection and/or diagnosis of SARS-CoV-2 by FDA under an Emergency Use Authorization (EUA).  This EUA will remain in effect (meaning this test can be used) for the duration of the COVID-19 declaration under Section 564(b)(1) of the Act, 21 U.S.C. section 360bbb-3(b)(1), unless the authorization is terminated or revoked sooner.  Performed at [facility], [place] [address], [place], [place] [address]   SARS Coronavirus 2 by RT PCR (hospital order, performed in [facility] hospital lab) Nasopharyngeal Nasopharyngeal Swab     Status: None   Collection Time: 11/05/19  9:48 PM   Specimen: Nasopharyngeal Swab  Result Value Ref Range Status   SARS Coronavirus 2 NEGATIVE NEGATIVE Final    Comment: (NOTE) SARS-CoV-2 target nucleic acids are NOT DETECTED.  The SARS-CoV-2 RNA is generally detectable in upper and lower respiratory specimens during the acute phase of infection. The lowest concentration of SARS-CoV-2 viral copies this assay can detect is 250 copies / mL. A negative result does not preclude SARS-CoV-2 infection and should not be used as the sole basis for treatment or other patient management  decisions.  A negative result may occur with improper specimen collection / handling, submission of specimen other than nasopharyngeal swab, presence of viral mutation(s) within the areas targeted by this assay, and inadequate number of viral copies (<250 copies / mL). A negative result must be combined with clinical observations, patient history, and epidemiological information.  Fact Sheet for Patients:   StrictlyIdeas.no  Fact Sheet for Healthcare Providers: BankingDealers.co.za  This test is not yet  approved or  cleared by the [place] FDA and has been authorized for detection and/or diagnosis of SARS-CoV-2 by FDA under an Emergency Use Authorization (EUA).  This EUA will remain in effect (meaning this test can be used) for the duration of the COVID-19 declaration under Section 564(b)(1) of the Act, 21 U.S.C. section 360bbb-3(b)(1), unless the authorization is terminated or revoked sooner.  Performed at [facility], [place] [address] [place], [place] [address]       Studies: CT ABDOMEN PELVIS WO CONTRAST  Result Date: 11/06/2019 CLINICAL DATA:  Abdominal pain.  Vaginal discharge EXAM: CT ABDOMEN AND PELVIS WITHOUT CONTRAST TECHNIQUE: Multidetector CT imaging of the abdomen and pelvis was performed following the standard protocol without oral or IV contrast. COMPARISON:  October 18, 2019 FINDINGS: Lower chest: There is bibasilar atelectasis. Hepatobiliary: No focal liver lesions are appreciable on this noncontrast enhanced study. Gallbladder wall is not appreciably thickened. There is no biliary duct dilatation. Pancreas: There is no pancreatic mass or inflammatory focus. Spleen: No splenic lesions are evident. Adrenals/Urinary Tract: Adrenals bilaterally appear normal. There is no evident renal mass on either side. Calcification along the posterior cortex of the left upper to mid kidney is a stable finding without associated mass effect. There is no evident hydronephrosis on either side. There is no appreciable renal or ureteral calculus on either side. Urinary bladder is midline with wall thickness within normal limits. Note that a Foley catheter is present within the bladder. Stomach/Bowel: There is moderate stool throughout the colon. There is no appreciable bowel wall or mesenteric thickening. There is no demonstrable bowel obstruction. There is no appreciable free air or portal venous air. The terminal ileum appears normal. Vascular/Lymphatic: There is no abdominal  aortic aneurysm. There is aortic and iliac artery atherosclerotic calcification. No adenopathy is appreciable in the abdomen or pelvis. Reproductive: Uterus is absent.  No evident pelvic mass. Other: No periappendiceal region inflammatory change. No abscess or ascites in the abdomen or pelvis. There is apparent scarring throughout the left lateral abdominal wall. There is no fluid in this area. Musculoskeletal: Arthropathy in the lumbar spine is again noted. There is severe disc space narrowing at L4-5 with 5 mm of anterolisthesis of L5 on S1, stable. There are pars defects at L4 and L5 bilaterally. No blastic or lytic bone lesions. No intramuscular lesions are demonstrable. IMPRESSION: 1. No bowel wall thickening or bowel obstruction is currently appreciable. No abscess in the abdomen pelvis. No periappendiceal region inflammation evident. 2. No renal or ureteral calculus. No hydronephrosis. Calcification along the left kidney posteriorly likely represents previous trauma or inflammation, stable. Foley catheter within urinary bladder. 3.  Scarring lateral left abdomen, grossly stable. 4. Extensive lower lumbar arthropathy. Spondylolisthesis at L4-5. 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 5.  Aortic Atherosclerosis (ICD10-I70.0). 6.  Uterus absent. Electronically Signed   By: Lowella Grip III M.D.   On: 11/06/2019 08:44   CT Head Wo Contrast  Result Date: 11/05/2019 CLINICAL DATA:  Mental status change EXAM: CT HEAD WITHOUT CONTRAST  TECHNIQUE: Contiguous axial images were obtained from the base of the skull through the vertex without intravenous contrast. COMPARISON:  October 27, 2013 FINDINGS: Brain: No evidence of acute territorial infarction, hemorrhage, hydrocephalus,extra-axial collection or mass lesion/mass effect. Normal gray-white differentiation. Ventricles are normal in size and contour. Vascular: No hyperdense vessel or unexpected calcification. Skull: The skull is intact. No fracture or focal  lesion identified. Sinuses/Orbits: The visualized paranasal sinuses and mastoid air cells are clear. The orbits and globes intact. Other: None IMPRESSION: No acute intracranial abnormality. Electronically Signed   By: Prudencio Pair M.D.   On: 11/05/2019 18:48   DG Chest Portable 1 View  Result Date: 11/05/2019 CLINICAL DATA:  Altered mental status EXAM: PORTABLE CHEST 1 VIEW COMPARISON:  October 07, 2019 FINDINGS: The heart size and mediastinal contours are partially obscured, however there is mild cardiomegaly. There is prominence of the central pulmonary vasculature. Hazy airspace opacity seen throughout the right lung. The left costophrenic angle is excluded from view. No definite acute osseous abnormality. There is stable appearance to the right distal clavicle. IMPRESSION: Limited due to technique. Pulmonary vascular congestion and hazy airspace opacity throughout the right lung. This could be due to asymmetric edema and/or infectious etiology. Electronically Signed   By: Prudencio Pair M.D.   On: 11/05/2019 23:29    Scheduled Meds:  atorvastatin  10 mg Oral Daily   heparin  5,000 Units Subcutaneous Q8H   insulin aspart  0-15 Units Subcutaneous TID WC   senna  1 tablet Oral Daily    Continuous Infusions:  cefTRIAXone (ROCEPHIN)  IV     metronidazole     sodium chloride       LOS: 0 days     Annita Brod, MD [facility]   11/06/2019, 3:01 PM

## 2019-11-06 NOTE — ED Notes (Signed)
Patient has no IV access at this time. IV in R AC does not flush. IV team attempted multiple times to start IV with no success. PA notified that pt no longer had IV access and was not receiving ordered fluids.

## 2019-11-06 NOTE — Procedures (Signed)
Interventional Radiology Procedure Note  Procedure: Placement of a right IJ tunneled PowerLine.  Cath tip in the upper RA and ready for use.   Complications: None  Estimated Blood Loss: None  Recommendations: - Routine line care   Signed,  Criselda Peaches, MD

## 2019-11-07 DIAGNOSIS — E11649 Type 2 diabetes mellitus with hypoglycemia without coma: Secondary | ICD-10-CM

## 2019-11-07 LAB — CBC
HCT: 29.6 % — ABNORMAL LOW (ref 36.0–46.0)
Hemoglobin: 8.9 g/dL — ABNORMAL LOW (ref 12.0–15.0)
MCH: 25.3 pg — ABNORMAL LOW (ref 26.0–34.0)
MCHC: 30.1 g/dL (ref 30.0–36.0)
MCV: 84.1 fL (ref 80.0–100.0)
Platelets: 651 10*3/uL — ABNORMAL HIGH (ref 150–400)
RBC: 3.52 MIL/uL — ABNORMAL LOW (ref 3.87–5.11)
RDW: 17 % — ABNORMAL HIGH (ref 11.5–15.5)
WBC: 6.1 10*3/uL (ref 4.0–10.5)
nRBC: 0.5 % — ABNORMAL HIGH (ref 0.0–0.2)

## 2019-11-07 LAB — COMPREHENSIVE METABOLIC PANEL
ALT: 15 U/L (ref 0–44)
AST: 11 U/L — ABNORMAL LOW (ref 15–41)
Albumin: 2.8 g/dL — ABNORMAL LOW (ref 3.5–5.0)
Alkaline Phosphatase: 97 U/L (ref 38–126)
Anion gap: 12 (ref 5–15)
BUN: 65 mg/dL — ABNORMAL HIGH (ref 8–23)
CO2: 23 mmol/L (ref 22–32)
Calcium: 8.8 mg/dL — ABNORMAL LOW (ref 8.9–10.3)
Chloride: 103 mmol/L (ref 98–111)
Creatinine, Ser: 6.92 mg/dL — ABNORMAL HIGH (ref 0.44–1.00)
GFR calc Af Amer: 7 mL/min — ABNORMAL LOW (ref >60)
GFR calc non Af Amer: 6 mL/min — ABNORMAL LOW (ref >60)
Glucose, Bld: 85 mg/dL (ref 70–99)
Potassium: 5.1 mmol/L (ref 3.5–5.1)
Sodium: 138 mmol/L (ref 135–145)
Total Bilirubin: 0.6 mg/dL (ref 0.3–1.2)
Total Protein: 6.3 g/dL — ABNORMAL LOW (ref 6.5–8.1)

## 2019-11-07 LAB — GLUCOSE, CAPILLARY
Glucose-Capillary: 66 mg/dL — ABNORMAL LOW (ref 70–99)
Glucose-Capillary: 89 mg/dL (ref 70–99)
Glucose-Capillary: 65 mg/dL — ABNORMAL LOW (ref 70–99)
Glucose-Capillary: 92 mg/dL (ref 70–99)
Glucose-Capillary: 92 mg/dL (ref 70–99)
Glucose-Capillary: 99 mg/dL (ref 70–99)

## 2019-11-07 MED ORDER — MORPHINE SULFATE (PF) 2 MG/ML IV SOLN
1.0000 mg | INTRAVENOUS | Status: DC | PRN
Start: 2019-11-07 — End: 2019-11-07

## 2019-11-07 MED ORDER — ONDANSETRON HCL 4 MG/2ML IJ SOLN: 4.0000 mg | Freq: Four times a day (QID) | INTRAMUSCULAR | Status: DC

## 2019-11-07 MED ORDER — HYDROCODONE-ACETAMINOPHEN 5-325 MG PO TABS
1.0000 | ORAL_TABLET | Freq: Three times a day (TID) | ORAL | Status: DC | PRN
  Administered 2019-11-09 – 2019-11-16 (×4): 1 via ORAL
  Filled 2019-11-07 (×4): qty 1

## 2019-11-07 MED ORDER — ONDANSETRON HCL 4 MG/2ML IJ SOLN
4.0000 mg | Freq: Four times a day (QID) | INTRAMUSCULAR | Status: DC | PRN
  Administered 2019-11-07 – 2019-11-19 (×6): 4 mg via INTRAVENOUS
  Filled 2019-11-07 (×6): qty 2

## 2019-11-07 MED ORDER — HYDROCODONE-ACETAMINOPHEN 5-325 MG PO TABS: 1.0000 | ORAL_TABLET | Freq: Four times a day (QID) | ORAL | Status: DC | PRN

## 2019-11-07 MED ORDER — DEXTROSE 50 % IV SOLN
12.5000 g | INTRAVENOUS | Status: AC
  Administered 2019-11-07: 12.5 g via INTRAVENOUS
  Filled 2019-11-07: qty 50

## 2019-11-07 MED ORDER — DEXTROSE 50 % IV SOLN
12.5000 g | INTRAVENOUS | Status: AC
  Administered 2019-11-07: 12.5 g via INTRAVENOUS

## 2019-11-07 NOTE — Progress Notes (Signed)
  Inpatient Diabetes Program Recommendations  AACE/ADA: New Consensus Statement on Inpatient Glycemic Control (2015)  Target Ranges:  Prepandial:   less than 140 mg/dL      Peak postprandial:   less than 180 mg/dL (1-2 hours)      Critically ill patients:  140 - 180 mg/dL   Lab Results  Component Value Date   GLUCAP 92 11/07/2019   HGBA1C 7.3 (H) 11/06/2019    Review of Glycemic Control Results for CAMBELL, RICKENBACH (MRN [id number]) as of 11/07/2019 12:49  Ref. Range 11/06/2019 07:50 11/06/2019 10:45 11/06/2019 11:44 11/06/2019 15:04 11/06/2019 16:47 11/06/2019 21:00 11/07/2019 07:33 11/07/2019 08:26 11/07/2019 11:32 11/07/2019 11:59  Glucose-Capillary Latest Ref Range: 70 - 99 mg/dL 85 84 89 95 92 88 66 (L) 89 65 (L) 92   Diabetes history: DM 2 Outpatient Diabetes medications: Actos 30 mg Daily, Januvia 100 mg Daily Current orders for Inpatient glycemic control:  Novolog 0-15 units tid   A1c 7.3% on 9/15  Inpatient Diabetes Program Recommendations:    Based on renal function and hypoglycemia without insulin being given consider:  -  Reducing Novolog Correction to "very sensitive" 0-6 units tid.  Thanks,  Tama Headings RN, MSN, BC-ADM Inpatient Diabetes Coordinator Team Pager [phone] (8a-5p)

## 2019-11-07 NOTE — Progress Notes (Signed)
Hypoglycemic Event  CBG: 66  Treatment: dextrose 50% solution 12.5 g by IV   Symptoms:Asymptomatic   Follow-up CBG: NPIO:9106 CBG Result:89  Possible Reasons for Event:NPO diet  Comments/MD notified:Krishnan,Sendil K MD    Thornton Papas
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
## 2019-11-07 NOTE — Progress Notes (Signed)
Hypoglycemic Event   CBG: 65  Treatment: dextrose 50% solution 12.5 g by IV   Symptoms:Asymptomatic   Follow-up CBG: Time:1159 CBG Result:92  Possible Reasons for Event:NPO diet  Comments/MD notified:Krishnan,Sendil K MD    Thornton Papas

## 2019-11-07 NOTE — Consult Note (Signed)
[place] [facility] Nurse wound consult note Consultation was completed by review of records, images and assistance from the bedside nurse/clinical staff.   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 Top with dry dressings. Change daily.   May benefit this patient to have follow up in wound care center of her choice to address chronic non healing wounds.   Re consult if needed, will not follow at this time. Thanks  Ainara Eldridge [employer], RN,CWOCN, CNS, [place] [phone])

## 2019-11-07 NOTE — Progress Notes (Addendum)
PROGRESS NOTE  [name] [name] LXB:[id number] DOB: 17-Nov-1955 DOA: 11/05/2019 PCP: [name], MD  HPI/Recap of past 50 hours: 64 year old female with past medical history of morbid obesity, hypertension, diabetes mellitus and stage IV chronic kidney disease who was sent over to the emergency room from her skilled nursing facility on evening of 9/14 for confusion.  Reportedly, this had started earlier in the day when patient was found be unresponsive to verbal stimuli subsequently incoherent.  In the emergency room, she improved spontaneously and responded to questions appropriately.  In the emergency room, found to have marked acute kidney injury with her baseline creatinine around 2.8 and was at 6.22 as well as a large urinary tract infection.  Patient started on IV fluids and antibiotics.  Also noted to have some vaginal discharge and started on Flagyl.  Today, patient is more awake and alert.  She tells me that she is doing okay no pain although she tells her nurse that she is having pain in her legs all the time.  Creatinine actually worsened from previous day up to 6.92.  Nephrology consulted.  Patient continues to make urine.  No further fever.  Assessment/Plan: Principal Problem:   Metabolic encephalopathy: Looks to have resolved, secondary dehydration from UTI.  Please also note that patient has a previous history of being found unresponsive from opiates which may be an underlying cause.  This would not however explain her renal failure. Active Problems:   Acute renal failure (HCC)/acute kidney injury in the setting of stage IV chronic kidney disease: Likely in part from dehydration from UTI and possibly hypotension from home medications.  IV fluids and IV Rocephin.  Worse today.  Nephrology consulted.    Diabetes type 2, with secondary stage IV chronic kidney disease ([place]): Follow CBGs  Opiate use: Potential for abuse certainly given accidental overdose few months back and  she comes in now with episode of acute encephalopathy which has resolved although her renal function has not yet improved.  Clinically undetermined for now.  Will resume Norco at much lower dose.  Vaginal discharge: On Flagyl.  Sending off for wet prep.    Morbid obesity [facility]): Patient is criteria BMI greater than 40.  Urinary tract infection: Underlying issue.  Sepsis ruled out.  Does not meet SIRS criteria.  IV Rocephin.   Code Status: Full code  Family Communication: Son at the bedside  Disposition Plan: Return back to skilled nursing, once renal function hopefully improves   Consultants:  Nephrology  Procedures:  PICC line placement on 9/15  Antimicrobials:  IV Rocephin 9/14-present  DVT prophylaxis: Subcu heparin   Objective: Vitals:   11/07/19 0537 11/07/19 1250  BP: 118/62 (!) 114/52  Pulse: 65 64  Resp:  17  Temp: 98.3 F (36.8 C) (!) 97.5 F (36.4 C)  SpO2:  96%    Intake/Output Summary (Last 24 hours) at 11/07/2019 1329 Last data filed at 11/07/2019 0600 Gross per 24 hour  Intake 2025 ml  Output 550 ml  Net 1475 ml   Filed Weights   11/06/19 1830  Weight: (!) 184.6 kg   Body mass index is 74.44 kg/m.  Exam:   General: Alert and oriented x3, no acute distress  HEENT: Normocephalic and atraumatic, mucous membranes dry  Neck: Thick, narrow airway  Cardiovascular: Regular rate and rhythm, S1-S2  Respiratory: Decreased breath sounds throughout secondary to body habitus  Abdomen: Soft, obese, nontender, hypoactive bowel sounds  Musculoskeletal: No clubbing or cyanosis, 1-2+ pitting edema  bilaterally  Neuro: No focal deficits  Psychiatry: Appears appropriate, no evidence of psychoses   Data Reviewed: CBC: Recent Labs  Lab 11/05/19 2216 11/06/19 0613 11/07/19 0459  WBC 8.0 7.4 6.1  HGB 9.0* 9.1* 8.9*  HCT 30.2* 30.5* 29.6*  MCV 84.6 84.0 84.1  PLT 693* 540* 539*   Basic Metabolic Panel: Recent Labs  Lab 11/05/19 2216  11/06/19 1751 11/07/19 0459  NA 139 138 138  K 5.1 4.8 5.1  CL 100 101 103  CO2 25 24 23   GLUCOSE 116* 114* 85  BUN 62* 58* 65*  CREATININE 6.22* 6.18* 6.92*  CALCIUM 9.3 8.5* 8.8*   GFR: Estimated Creatinine Clearance: 13.1 mL/min (A) (by C-G formula based on SCr of 6.92 mg/dL (H)). Liver Function Tests: Recent Labs  Lab 11/05/19 2216 11/06/19 1751 11/07/19 0459  AST 17 14* 11*  ALT 17 15 15   ALKPHOS 104 94 97  BILITOT 0.4 0.4 0.6  PROT 6.5 6.0* 6.3*  ALBUMIN 3.1* 2.7* 2.8*   No results for input(s): LIPASE, AMYLASE in the last 168 hours. Recent Labs  Lab 11/05/19 2216  AMMONIA 36*   Coagulation Profile: Recent Labs  Lab 11/06/19 0613  INR 1.0   Cardiac Enzymes: No results for input(s): CKTOTAL, CKMB, CKMBINDEX, TROPONINI in the last 168 hours. BNP (last 3 results) No results for input(s): PROBNP in the last 8760 hours. HbA1C: Recent Labs    11/06/19 0613  HGBA1C 7.3*   CBG: Recent Labs  Lab 11/06/19 2100 11/07/19 0733 11/07/19 0826 11/07/19 1132 11/07/19 1159  GLUCAP 88 66* 89 65* 92   Lipid Profile: No results for input(s): CHOL, HDL, LDLCALC, TRIG, CHOLHDL, LDLDIRECT in the last 72 hours. Thyroid Function Tests: Recent Labs    11/06/19 0029  TSH 4.236   Anemia Panel: No results for input(s): VITAMINB12, FOLATE, FERRITIN, TIBC, IRON, RETICCTPCT in the last 72 hours. Urine analysis:    Component Value Date/Time   COLORURINE AMBER (A) 11/06/2019 0130   APPEARANCEUR CLOUDY (A) 11/06/2019 0130   LABSPEC 1.017 11/06/2019 0130   PHURINE 5.0 11/06/2019 0130   GLUCOSEU NEGATIVE 11/06/2019 0130   HGBUR MODERATE (A) 11/06/2019 0130   BILIRUBINUR NEGATIVE 11/06/2019 0130   KETONESUR NEGATIVE 11/06/2019 0130   PROTEINUR 30 (A) 11/06/2019 0130   UROBILINOGEN 0.2 06/28/2014 1500   NITRITE NEGATIVE 11/06/2019 0130   LEUKOCYTESUR LARGE (A) 11/06/2019 0130   Sepsis Labs: @LABRCNTIP (procalcitonin:4,lacticidven:4)  ) Recent Results (from the  past 240 hour(s))  SARS Coronavirus 2 by RT PCR (hospital order, performed in [place] hospital lab) Nasopharyngeal Nasopharyngeal Swab     Status: None   Collection Time: 10/29/19  8:15 AM   Specimen: Nasopharyngeal Swab  Result Value Ref Range Status   SARS Coronavirus 2 NEGATIVE NEGATIVE Final    Comment: (NOTE) SARS-CoV-2 target nucleic acids are NOT DETECTED.  The SARS-CoV-2 RNA is generally detectable in upper and lower respiratory specimens during the acute phase of infection. The lowest concentration of SARS-CoV-2 viral copies this assay can detect is 250 copies / mL. A negative result does not preclude SARS-CoV-2 infection and should not be used as the sole basis for treatment or other patient management decisions.  A negative result may occur with improper specimen collection / handling, submission of specimen other than nasopharyngeal swab, presence of viral mutation(s) within the areas targeted by this assay, and inadequate number of viral copies (<250 copies / mL). A negative result must be combined with clinical observations, patient history, and epidemiological information.  Fact Sheet for Patients:   StrictlyIdeas.no  Fact Sheet for Healthcare Providers: BankingDealers.co.za  This test is not yet approved or  cleared by the [place] FDA and has been authorized for detection and/or diagnosis of SARS-CoV-2 by FDA under an Emergency Use Authorization (EUA).  This EUA will remain in effect (meaning this test can be used) for the duration of the COVID-19 declaration under Section 564(b)(1) of the Act, 21 U.S.C. section 360bbb-3(b)(1), unless the authorization is terminated or revoked sooner.  Performed at [facility], [place] [address] [place], [place] [address]   SARS Coronavirus 2 by RT PCR (hospital order, performed in [facility] hospital lab) Nasopharyngeal Nasopharyngeal Swab     Status:  None   Collection Time: 11/05/19  9:48 PM   Specimen: Nasopharyngeal Swab  Result Value Ref Range Status   SARS Coronavirus 2 NEGATIVE NEGATIVE Final    Comment: (NOTE) SARS-CoV-2 target nucleic acids are NOT DETECTED.  The SARS-CoV-2 RNA is generally detectable in upper and lower respiratory specimens during the acute phase of infection. The lowest concentration of SARS-CoV-2 viral copies this assay can detect is 250 copies / mL. A negative result does not preclude SARS-CoV-2 infection and should not be used as the sole basis for treatment or other patient management decisions.  A negative result may occur with improper specimen collection / handling, submission of specimen other than nasopharyngeal swab, presence of viral mutation(s) within the areas targeted by this assay, and inadequate number of viral copies (<250 copies / mL). A negative result must be combined with clinical observations, patient history, and epidemiological information.  Fact Sheet for Patients:   StrictlyIdeas.no  Fact Sheet for Healthcare Providers: BankingDealers.co.za  This test is not yet approved or  cleared by the [place] FDA and has been authorized for detection and/or diagnosis of SARS-CoV-2 by FDA under an Emergency Use Authorization (EUA).  This EUA will remain in effect (meaning this test can be used) for the duration of the COVID-19 declaration under Section 564(b)(1) of the Act, 21 U.S.C. section 360bbb-3(b)(1), unless the authorization is terminated or revoked sooner.  Performed at [facility], [place] [address], [place], [place] [address]       Studies: IR Fluoro Guide CV Line Right  Result Date: 11/06/2019 INDICATION: 64 year old female with acute kidney injury in need of durable venous access. She is morbidly obese and presents for placement of a tunneled central venous catheter. EXAM: IR ULTRASOUND GUIDANCE VASC  ACCESS RIGHT; IR RIGHT FLUORO GUIDE CV LINE MEDICATIONS: None ANESTHESIA/SEDATION: None FLUOROSCOPY TIME:  Fluoroscopy Time: 0 minutes 24 seconds (11 mGy). COMPLICATIONS: None immediate. PROCEDURE: Informed written consent was obtained from the patient after a thorough discussion of the procedural risks, benefits and alternatives. All questions were addressed. Maximal Sterile Barrier Technique was utilized including caps, mask, sterile gowns, sterile gloves, sterile drape, hand hygiene and skin antiseptic. A timeout was performed prior to the initiation of the procedure. The right internal jugular vein was interrogated with ultrasound and found to be widely patent. An image was obtained and stored for the medical record. Local anesthesia was attained by infiltration with 1% lidocaine. A small dermatotomy was made. Under real-time sonographic guidance, the vessel was punctured with a 21 gauge micropuncture needle. Using standard technique, the initial micro needle was exchanged over a 0.018 micro wire for a peel-away sheath. A suitable skin exit site was selected. Local anesthesia was again attained by infiltration with 1% lidocaine. A small dermatotomy was  made. A dual lumen power injectable central venous catheter was then tunneled from the dermatotomy at the skin exit site to the dermatotomy overlying the venous access site. The catheter was then cut to length and advanced through the peel-away sheath and position with the tip in the upper right atrium. The catheter was secured to the skin with 0 Prolene suture. The catheter flushes and aspirates easily. The catheter was flushed and capped. The dermatotomy at the venous access site was sealed with Dermabond. Sterile bandages were applied. IMPRESSION: Placement of a right IJ approach tunneled central venous catheter. The catheter tips are in the upper right atrium and the catheter is ready for immediate use. Electronically Signed   By: Jacqulynn Cadet M.D.   On:  11/06/2019 16:51   IR US Guide Vasc Access Right  Result Date: 11/06/2019 INDICATION: 64 year old female with acute kidney injury in need of durable venous access. She is morbidly obese and presents for placement of a tunneled central venous catheter. EXAM: IR ULTRASOUND GUIDANCE VASC ACCESS RIGHT; IR RIGHT FLUORO GUIDE CV LINE MEDICATIONS: None ANESTHESIA/SEDATION: None FLUOROSCOPY TIME:  Fluoroscopy Time: 0 minutes 24 seconds (11 mGy). COMPLICATIONS: None immediate. PROCEDURE: Informed written consent was obtained from the patient after a thorough discussion of the procedural risks, benefits and alternatives. All questions were addressed. Maximal Sterile Barrier Technique was utilized including caps, mask, sterile gowns, sterile gloves, sterile drape, hand hygiene and skin antiseptic. A timeout was performed prior to the initiation of the procedure. The right internal jugular vein was interrogated with ultrasound and found to be widely patent. An image was obtained and stored for the medical record. Local anesthesia was attained by infiltration with 1% lidocaine. A small dermatotomy was made. Under real-time sonographic guidance, the vessel was punctured with a 21 gauge micropuncture needle. Using standard technique, the initial micro needle was exchanged over a 0.018 micro wire for a peel-away sheath. A suitable skin exit site was selected. Local anesthesia was again attained by infiltration with 1% lidocaine. A small dermatotomy was made. A dual lumen power injectable central venous catheter was then tunneled from the dermatotomy at the skin exit site to the dermatotomy overlying the venous access site. The catheter was then cut to length and advanced through the peel-away sheath and position with the tip in the upper right atrium. The catheter was secured to the skin with 0 Prolene suture. The catheter flushes and aspirates easily. The catheter was flushed and capped. The dermatotomy at the venous access  site was sealed with Dermabond. Sterile bandages were applied. IMPRESSION: Placement of a right IJ approach tunneled central venous catheter. The catheter tips are in the upper right atrium and the catheter is ready for immediate use. Electronically Signed   By: Jacqulynn Cadet M.D.   On: 11/06/2019 16:51    Scheduled Meds: . atorvastatin  10 mg Oral Daily  . Chlorhexidine Gluconate Cloth  6 each Topical Daily  . heparin  5,000 Units Subcutaneous Q8H  . insulin aspart  0-15 Units Subcutaneous TID WC  . senna  1 tablet Oral Daily    Continuous Infusions: . sodium chloride 100 mL/hr at 11/07/19 0357  . cefTRIAXone (ROCEPHIN)  IV 1 g (11/07/19 0501)  . metronidazole 500 mg (11/07/19 0543)  . sodium chloride       LOS: 1 day     Annita Brod, MD [facility]   11/07/2019, 1:29 PM

## 2019-11-07 NOTE — Progress Notes (Signed)
Pt bgl is 64. Krishnan,Sendil MD notified.

## 2019-11-08 DIAGNOSIS — G9340 Encephalopathy, unspecified: Secondary | ICD-10-CM

## 2019-11-08 DIAGNOSIS — E1122 Type 2 diabetes mellitus with diabetic chronic kidney disease: Secondary | ICD-10-CM

## 2019-11-08 LAB — COMPREHENSIVE METABOLIC PANEL
ALT: 15 U/L (ref 0–44)
AST: 12 U/L — ABNORMAL LOW (ref 15–41)
Albumin: 2.7 g/dL — ABNORMAL LOW (ref 3.5–5.0)
Alkaline Phosphatase: 89 U/L (ref 38–126)
Anion gap: 11 (ref 5–15)
BUN: 62 mg/dL — ABNORMAL HIGH (ref 8–23)
CO2: 23 mmol/L (ref 22–32)
Calcium: 8.8 mg/dL — ABNORMAL LOW (ref 8.9–10.3)
Chloride: 105 mmol/L (ref 98–111)
Creatinine, Ser: 7.15 mg/dL — ABNORMAL HIGH (ref 0.44–1.00)
GFR calc Af Amer: 6 mL/min — ABNORMAL LOW (ref >60)
GFR calc non Af Amer: 5 mL/min — ABNORMAL LOW (ref >60)
Glucose, Bld: 84 mg/dL (ref 70–99)
Potassium: 4.9 mmol/L (ref 3.5–5.1)
Sodium: 139 mmol/L (ref 135–145)
Total Bilirubin: 0.5 mg/dL (ref 0.3–1.2)
Total Protein: 6.1 g/dL — ABNORMAL LOW (ref 6.5–8.1)

## 2019-11-08 LAB — CBC
HCT: 28.1 % — ABNORMAL LOW (ref 36.0–46.0)
Hemoglobin: 8.4 g/dL — ABNORMAL LOW (ref 12.0–15.0)
MCH: 25.2 pg — ABNORMAL LOW (ref 26.0–34.0)
MCHC: 29.9 g/dL — ABNORMAL LOW (ref 30.0–36.0)
MCV: 84.4 fL (ref 80.0–100.0)
Platelets: 665 10*3/uL — ABNORMAL HIGH (ref 150–400)
RBC: 3.33 MIL/uL — ABNORMAL LOW (ref 3.87–5.11)
RDW: 16.6 % — ABNORMAL HIGH (ref 11.5–15.5)
WBC: 7.1 10*3/uL (ref 4.0–10.5)
nRBC: 0.6 % — ABNORMAL HIGH (ref 0.0–0.2)

## 2019-11-08 LAB — IRON AND TIBC
Iron: 31 ug/dL (ref 28–170)
Saturation Ratios: 7 % — ABNORMAL LOW (ref 10.4–31.8)
TIBC: 415 ug/dL (ref 250–450)
UIBC: 384 ug/dL

## 2019-11-08 LAB — GLUCOSE, CAPILLARY
Glucose-Capillary: 72 mg/dL (ref 70–99)
Glucose-Capillary: 111 mg/dL — ABNORMAL HIGH (ref 70–99)
Glucose-Capillary: 94 mg/dL (ref 70–99)
Glucose-Capillary: 98 mg/dL (ref 70–99)

## 2019-11-08 LAB — FERRITIN: Ferritin: 9 ng/mL — ABNORMAL LOW (ref 11–307)

## 2019-11-08 MED ORDER — LIP MEDEX EX OINT
TOPICAL_OINTMENT | CUTANEOUS | Status: DC | PRN
  Filled 2019-11-08: qty 7

## 2019-11-08 MED ORDER — FUROSEMIDE 10 MG/ML IJ SOLN
160.0000 mg | Freq: Once | INTRAVENOUS | Status: AC
  Administered 2019-11-08: 160 mg via INTRAVENOUS
  Filled 2019-11-08: qty 16

## 2019-11-08 MED ORDER — MIDODRINE HCL 5 MG PO TABS
5.0000 mg | ORAL_TABLET | Freq: Three times a day (TID) | ORAL | Status: DC
  Administered 2019-11-08 – 2019-11-09 (×4): 5 mg via ORAL
  Filled 2019-11-08 (×4): qty 1

## 2019-11-08 MED ORDER — INSULIN ASPART 100 UNIT/ML ~~LOC~~ SOLN: 0.0000 [IU] | Freq: Three times a day (TID) | SUBCUTANEOUS | Status: DC

## 2019-11-08 MED ORDER — SODIUM CHLORIDE 0.9 % IV SOLN
510.0000 mg | INTRAVENOUS | Status: AC
  Administered 2019-11-08 – 2019-11-15 (×2): 510 mg via INTRAVENOUS
  Filled 2019-11-08 (×2): qty 510

## 2019-11-08 NOTE — Progress Notes (Signed)
PROGRESS NOTE    [name] [name]  NOB:[id number] DOB: 1955/10/28 DOA: 11/05/2019 PCP: [name], MD    Brief Narrative:  64 year old female with morbid obesity, hypertension, type 2 diabetes, stage IV chronic kidney disease with baseline creatinine about 1.7, recent left leg injury and fracture recovering at the skilled nursing facility brought to the ER on 9/14 for confusion.  Patient was reportedly unresponsive to verbal stimuli and subsequently was incoherent.  In the emergency room, she improved spontaneously and responded to questions appropriately.  Patient is on multiple medications including narcotics and benzodiazepine.  Admitted with mild acute metabolic encephalopathy and acute renal failure.   Assessment & Plan:   Principal Problem:   Acute renal failure (HCC) Active Problems:   Type 2 diabetes mellitus with stage 4 chronic kidney disease (HCC)   Metabolic encephalopathy   CKD (chronic kidney disease) stage 4, GFR 15-29 ml/min (HCC)   Pressure injury of skin   Morbid obesity (HCC)   Encephalopathy acute   Vaginal discharge  Acute renal failure: Thought to be prerenal with underlying history of stage IV chronic kidney disease due to diabetes. Significant abnormal renal function.  Attributed to multifactorial from use of multiple medications, chlorthalidone and low blood pressure.  In the nursing home she was on multiple CNS medication including Xanax, Cymbalta, Neurontin, hydroxyzine and Percocet. Continue maintenance IV fluid.  Not uremic at this time.  Followed by nephrology. Urine output, exact measurement with Foley catheter, reported 350 mL last 24 hours. Mentation has improved.  Hopefully wait for recovery.  Acute metabolic encephalopathy: Suspect polypharmacy.  Patient on Xanax, Cymbalta, Neurontin, hydroxyzine and Percocet.  No focal neurological deficit.  Mental status improved.  Discontinued all sedating medications.  Started on low-dose Percocet.  We  will continue.  Type 2 diabetes: With hypoglycemic episodes.  On very sensitive sliding scale and regular diet.  Morbid obesity: BMI 74.  High risk of mortality and morbidity.  UTI: Present on admission.  Sepsis ruled out.  On IV Rocephin.  Will continue until 3 days all cultures results.   DVT prophylaxis: heparin injection 5,000 Units Start: 11/06/19 0600   Code Status: Full code Family Communication: Sister on the phone Disposition Plan: Status is: Inpatient  Remains inpatient appropriate because:IV treatments appropriate due to intensity of illness or inability to take PO and Inpatient level of care appropriate due to severity of illness   Dispo: The patient is from: SNF              Anticipated d/c is to: SNF              Anticipated d/c date is: > 3 days              Patient currently is not medically stable to d/c.         Consultants:   Nephrology  Procedures:   None  Antimicrobials:   Rocephin, 9/14---   Subjective: Patient seen and examined.  No overnight events.  Has some nausea but no vomiting. She is able to eat today.  Denies any pain.  She has not been mobilized.  Objective: Vitals:   11/07/19 0537 11/07/19 1250 11/07/19 2116 11/08/19 0436  BP: 118/62 (!) 114/52 (!) 93/44 (!) 95/43  Pulse: 65 64 67 67  Resp:  17 17 17   Temp: 98.3 F (36.8 C) (!) 97.5 F (36.4 C) 97.8 F (36.6 C) 98 F (36.7 C)  TempSrc: Oral Oral Oral Oral  SpO2:  96% 94% 100%  Weight:      Height:        Intake/Output Summary (Last 24 hours) at 11/08/2019 1106 Last data filed at 11/08/2019 0436 Gross per 24 hour  Intake 1560 ml  Output 350 ml  Net 1210 ml   Filed Weights   11/06/19 1830  Weight: (!) 184.6 kg    Examination:  General exam: Appears calm and comfortable  Morbidly obese and chronically sick looking lady on room air.  Communicating well.  Not in any distress. Respiratory system: Clear to auscultation. Respiratory effort normal. Cardiovascular  system: S1 & S2 heard, RRR. No JVD, murmurs, rubs, gallops or clicks. No pedal edema. Gastrointestinal system: Abdomen is nondistended, soft and nontender. No organomegaly or masses felt. Normal bowel sounds heard.  Obese and pendulous. Central nervous system: Alert and oriented. No focal neurological deficits. Extremities: Symmetric 5 x 5 power. Skin: No rashes, lesions or ulcers Psychiatry: Judgement and insight appear normal. Mood & affect appropriate.     Data Reviewed: I have personally reviewed following labs and imaging studies  CBC: Recent Labs  Lab 11/05/19 2216 11/06/19 0613 11/07/19 0459 11/08/19 0310  WBC 8.0 7.4 6.1 7.1  HGB 9.0* 9.1* 8.9* 8.4*  HCT 30.2* 30.5* 29.6* 28.1*  MCV 84.6 84.0 84.1 84.4  PLT 693* 540* 651* 166*   Basic Metabolic Panel: Recent Labs  Lab 11/05/19 2216 11/06/19 1751 11/07/19 0459 11/08/19 0310  NA 139 138 138 139  K 5.1 4.8 5.1 4.9  CL 100 101 103 105  CO2 25 24 23 23   GLUCOSE 116* 114* 85 84  BUN 62* 58* 65* 62*  CREATININE 6.22* 6.18* 6.92* 7.15*  CALCIUM 9.3 8.5* 8.8* 8.8*   GFR: Estimated Creatinine Clearance: 12.7 mL/min (A) (by C-G formula based on SCr of 7.15 mg/dL (H)). Liver Function Tests: Recent Labs  Lab 11/05/19 2216 11/06/19 1751 11/07/19 0459 11/08/19 0310  AST 17 14* 11* 12*  ALT 17 15 15 15   ALKPHOS 104 94 97 89  BILITOT 0.4 0.4 0.6 0.5  PROT 6.5 6.0* 6.3* 6.1*  ALBUMIN 3.1* 2.7* 2.8* 2.7*   No results for input(s): LIPASE, AMYLASE in the last 168 hours. Recent Labs  Lab 11/05/19 2216  AMMONIA 36*   Coagulation Profile: Recent Labs  Lab 11/06/19 0613  INR 1.0   Cardiac Enzymes: No results for input(s): CKTOTAL, CKMB, CKMBINDEX, TROPONINI in the last 168 hours. BNP (last 3 results) No results for input(s): PROBNP in the last 8760 hours. HbA1C: Recent Labs    11/06/19 0613  HGBA1C 7.3*   CBG: Recent Labs  Lab 11/07/19 1132 11/07/19 1159 11/07/19 1710 11/07/19 2114 11/08/19 0731   GLUCAP 65* 92 92 99 72   Lipid Profile: No results for input(s): CHOL, HDL, LDLCALC, TRIG, CHOLHDL, LDLDIRECT in the last 72 hours. Thyroid Function Tests: Recent Labs    11/06/19 0029  TSH 4.236   Anemia Panel: No results for input(s): VITAMINB12, FOLATE, FERRITIN, TIBC, IRON, RETICCTPCT in the last 72 hours. Sepsis Labs: Recent Labs  Lab 11/06/19 0630  LATICACIDVEN 0.8    Recent Results (from the past 240 hour(s))  SARS Coronavirus 2 by RT PCR (hospital order, performed in [facility] hospital lab) Nasopharyngeal Nasopharyngeal Swab     Status: None   Collection Time: 11/05/19  9:48 PM   Specimen: Nasopharyngeal Swab  Result Value Ref Range Status   SARS Coronavirus 2 NEGATIVE NEGATIVE Final    Comment: (NOTE) SARS-CoV-2 target nucleic acids are NOT DETECTED.  The SARS-CoV-2  RNA is generally detectable in upper and lower respiratory specimens during the acute phase of infection. The lowest concentration of SARS-CoV-2 viral copies this assay can detect is 250 copies / mL. A negative result does not preclude SARS-CoV-2 infection and should not be used as the sole basis for treatment or other patient management decisions.  A negative result may occur with improper specimen collection / handling, submission of specimen other than nasopharyngeal swab, presence of viral mutation(s) within the areas targeted by this assay, and inadequate number of viral copies (<250 copies / mL). A negative result must be combined with clinical observations, patient history, and epidemiological information.  Fact Sheet for Patients:   StrictlyIdeas.no  Fact Sheet for Healthcare Providers: BankingDealers.co.za  This test is not yet approved or  cleared by the [place] FDA and has been authorized for detection and/or diagnosis of SARS-CoV-2 by FDA under an Emergency Use Authorization (EUA).  This EUA will remain in effect (meaning this  test can be used) for the duration of the COVID-19 declaration under Section 564(b)(1) of the Act, 21 U.S.C. section 360bbb-3(b)(1), unless the authorization is terminated or revoked sooner.  Performed at [facility], [place] [address], [place], [place] [address]          Radiology Studies: IR Fluoro Guide CV Line Right  Result Date: 11/06/2019 INDICATION: 64 year old female with acute kidney injury in need of durable venous access. She is morbidly obese and presents for placement of a tunneled central venous catheter. EXAM: IR ULTRASOUND GUIDANCE VASC ACCESS RIGHT; IR RIGHT FLUORO GUIDE CV LINE MEDICATIONS: None ANESTHESIA/SEDATION: None FLUOROSCOPY TIME:  Fluoroscopy Time: 0 minutes 24 seconds (11 mGy). COMPLICATIONS: None immediate. PROCEDURE: Informed written consent was obtained from the patient after a thorough discussion of the procedural risks, benefits and alternatives. All questions were addressed. Maximal Sterile Barrier Technique was utilized including caps, mask, sterile gowns, sterile gloves, sterile drape, hand hygiene and skin antiseptic. A timeout was performed prior to the initiation of the procedure. The right internal jugular vein was interrogated with ultrasound and found to be widely patent. An image was obtained and stored for the medical record. Local anesthesia was attained by infiltration with 1% lidocaine. A small dermatotomy was made. Under real-time sonographic guidance, the vessel was punctured with a 21 gauge micropuncture needle. Using standard technique, the initial micro needle was exchanged over a 0.018 micro wire for a peel-away sheath. A suitable skin exit site was selected. Local anesthesia was again attained by infiltration with 1% lidocaine. A small dermatotomy was made. A dual lumen power injectable central venous catheter was then tunneled from the dermatotomy at the skin exit site to the dermatotomy overlying the venous access site. The  catheter was then cut to length and advanced through the peel-away sheath and position with the tip in the upper right atrium. The catheter was secured to the skin with 0 Prolene suture. The catheter flushes and aspirates easily. The catheter was flushed and capped. The dermatotomy at the venous access site was sealed with Dermabond. Sterile bandages were applied. IMPRESSION: Placement of a right IJ approach tunneled central venous catheter. The catheter tips are in the upper right atrium and the catheter is ready for immediate use. Electronically Signed   By: Jacqulynn Cadet M.D.   On: 11/06/2019 16:51   IR US Guide Vasc Access Right  Result Date: 11/06/2019 INDICATION: 64 year old female with acute kidney injury in need of durable venous access. She is morbidly obese and presents  for placement of a tunneled central venous catheter. EXAM: IR ULTRASOUND GUIDANCE VASC ACCESS RIGHT; IR RIGHT FLUORO GUIDE CV LINE MEDICATIONS: None ANESTHESIA/SEDATION: None FLUOROSCOPY TIME:  Fluoroscopy Time: 0 minutes 24 seconds (11 mGy). COMPLICATIONS: None immediate. PROCEDURE: Informed written consent was obtained from the patient after a thorough discussion of the procedural risks, benefits and alternatives. All questions were addressed. Maximal Sterile Barrier Technique was utilized including caps, mask, sterile gowns, sterile gloves, sterile drape, hand hygiene and skin antiseptic. A timeout was performed prior to the initiation of the procedure. The right internal jugular vein was interrogated with ultrasound and found to be widely patent. An image was obtained and stored for the medical record. Local anesthesia was attained by infiltration with 1% lidocaine. A small dermatotomy was made. Under real-time sonographic guidance, the vessel was punctured with a 21 gauge micropuncture needle. Using standard technique, the initial micro needle was exchanged over a 0.018 micro wire for a peel-away sheath. A suitable skin exit  site was selected. Local anesthesia was again attained by infiltration with 1% lidocaine. A small dermatotomy was made. A dual lumen power injectable central venous catheter was then tunneled from the dermatotomy at the skin exit site to the dermatotomy overlying the venous access site. The catheter was then cut to length and advanced through the peel-away sheath and position with the tip in the upper right atrium. The catheter was secured to the skin with 0 Prolene suture. The catheter flushes and aspirates easily. The catheter was flushed and capped. The dermatotomy at the venous access site was sealed with Dermabond. Sterile bandages were applied. IMPRESSION: Placement of a right IJ approach tunneled central venous catheter. The catheter tips are in the upper right atrium and the catheter is ready for immediate use. Electronically Signed   By: Jacqulynn Cadet M.D.   On: 11/06/2019 16:51        Scheduled Meds:  atorvastatin  10 mg Oral Daily   Chlorhexidine Gluconate Cloth  6 each Topical Daily   heparin  5,000 Units Subcutaneous Q8H   insulin aspart  0-6 Units Subcutaneous TID WC   senna  1 tablet Oral Daily   Continuous Infusions:  sodium chloride 100 mL/hr at 11/07/19 1707   cefTRIAXone (ROCEPHIN)  IV 1 g (11/08/19 0518)   metronidazole 500 mg (11/08/19 0622)   sodium chloride       LOS: 2 days    Time spent: 30 minutes    Barb Merino, MD [facility] Pager [phone]

## 2019-11-08 NOTE — Evaluation (Signed)
Physical Therapy Evaluation Patient Details Name: [name] [name] MRN: [id number] DOB: May 31, 1955 Today's Date: 11/08/2019   History of Present Illness  Patient is 64 y.o. female with PMH significant for HTN, DM, neuropathy, Lt knee fracture, CKD, obesity. Pt admitted from SNF for AMS. Per prior hospital admission  ([facility] home care called and reported unsafe living conditions as caregiver unable to assist pt).  Patient had recent hospital admission on 10/18/2019-10/20/2019 for possible GI bleed.  She was found to have severe constipation and not a GI bleed.  We also found that she had a left knee fracture, unknown when the fracture occurred sometime in August. Pt NWB on Lt LE last admission from 9/1-9/7.    Clinical Impression  [name] [name] is 64 y.o. female admitted with above HPI and diagnosis. Patient is currently limited by functional impairments below (see PT problem list). Patient was rehabbing at [facility] and has been limited with mobility and primarily bed bound over the last 10 weeks after a fall at home resulting in a Lt LE fracture. Patient currently requires MAX-TOTAL Assist for bed mobility and is limited by body habitus. Patient does NOT appear able to mobilize or perform ADLs without assist and reports she no longer has a home aid and get intermittent assist from a friend at home. Patient will benefit from continued skilled PT interventions to address impairments and progress independence with mobility, recommending pt continue with rehab at SNF level. Acute PT will follow and progress as able.      Follow Up Recommendations SNF;Supervision/Assistance - 24 hour    Equipment Recommendations  None recommended by PT    Recommendations for Other Services       Precautions / Restrictions Precautions Precautions: Fall Restrictions LLE Weight Bearing: Non weight bearing Other Position/Activity Restrictions: Per last admission MD said "stay off it"  no further guidance on  restrictions.  Pt was supposed to f/u Sept 7th per her report but did not.      Mobility  Bed Mobility Overal bed mobility: Needs Assistance Bed Mobility: Sit to Supine;Rolling;Sidelying to Sit Rolling: Total assist;+2 for physical assistance;+2 for safety/equipment Sidelying to sit: Total assist;+2 for physical assistance;+2 for safety/equipment;HOB elevated   Sit to supine: Total assist;+2 for physical assistance;+2 for safety/equipment   General bed mobility comments: Patient required mod assist to reach UE to rail and Max-Total assist to complete rolling upper and lower trunk to sidelying. performed roll bil. TOTAL Assist for sitting up EOB and max-mod assist to steady with UE support on bed. No signs of distress with sitting EOB and VSS, pt unsafe to progress to standing with only 2 person assist available.  Transfers      General transfer comment: NT due to safety concerns  Ambulation/Gait       Stairs            Wheelchair Mobility    Modified Rankin (Stroke Patients Only)       Balance Overall balance assessment: Needs assistance Sitting-balance support: Bilateral upper extremity supported;Feet unsupported Sitting balance-Leahy Scale: Fair                Pertinent Vitals/Pain Pain Assessment: Faces Faces Pain Scale: Hurts a little bit Pain Location: Lt LE with mobility (Lt LE knee pain) Pain Descriptors / Indicators: Sore;Grimacing Pain Intervention(s): Limited activity within patient's tolerance;Monitored during session;Repositioned    Home Living Family/patient expects to be discharged to:: Skilled nursing facility Living Arrangements: Alone Available Help at Discharge: Friend(s);Available PRN/intermittently Type of  Home: Apartment Home Access: Ramped entrance     Home Layout: One level Home Equipment: Hospital bed;Walker - 4 wheels;Wheelchair - manual Additional Comments: pt reports she is supposed to get bariatric hospital bed; friend was  coming to give her a bath and change sheets daily. bf fall pt reports she was walking and bathing self    Prior Function Level of Independence: Needs assistance   Gait / Transfers Assistance Needed: "Reports she is able to get out of bed and use RW around house.  Uses w/c for mobility outside of home." per notes 2 months ago; pt currently reports bedridden for last 9 weeks  ADL's / Homemaking Assistance Needed: no longer has an aide to assist. Now her friend was assissitn her last. her with baths (sounds like bed baths recently), dressing.        Hand Dominance        Extremity/Trunk Assessment   Upper Extremity Assessment Upper Extremity Assessment: Defer to OT evaluation    Lower Extremity Assessment Lower Extremity Assessment: Generalized weakness;LLE deficits/detail;RLE deficits/detail RLE Deficits / Details: grossly 2/5 or less for strength, requries AAROM for testing in supine, pt limited by body habitus LLE Deficits / Details: no knee testin gperformed due to pain, pt able to weak perform quad set and dorsi/plantar flex ankle.     Cervical / Trunk Assessment Cervical / Trunk Assessment: Other exceptions Cervical / Trunk Exceptions: body habitus limiting  Communication   Communication: No difficulties  Cognition Arousal/Alertness: Awake/alert Behavior During Therapy: WFL for tasks assessed/performed Overall Cognitive Status: Within Functional Limits for tasks assessed              General Comments: Pt pleasant and A&O x4      General Comments      Exercises General Exercises - Lower Extremity Ankle Circles/Pumps: AROM;Both;20 reps;Supine Quad Sets: AROM;Both;15 reps;Supine Gluteal Sets: AROM;Both;Supine;20 reps   Assessment/Plan    PT Assessment Patient needs continued PT services  PT Problem List Decreased strength;Decreased mobility;Obesity;Decreased balance;Decreased knowledge of use of DME;Decreased activity tolerance;Decreased range of motion        PT Treatment Interventions DME instruction;Gait training;Functional mobility training;Therapeutic activities;Therapeutic exercise;Balance training;Patient/family education;Wheelchair mobility training    PT Goals (Current goals can be found in the Care Plan section)  Acute Rehab PT Goals Patient Stated Goal: get up and move and walk again PT Goal Formulation: With patient Time For Goal Achievement: 11/22/19 Potential to Achieve Goals: Fair    Frequency Min 2X/week        AM-PAC PT "6 Clicks" Mobility  Outcome Measure Help needed turning from your back to your side while in a flat bed without using bedrails?: Total Help needed moving from lying on your back to sitting on the side of a flat bed without using bedrails?: Total Help needed moving to and from a bed to a chair (including a wheelchair)?: Total Help needed standing up from a chair using your arms (e.g., wheelchair or bedside chair)?: Total Help needed to walk in hospital room?: Total Help needed climbing 3-5 steps with a railing? : Total 6 Click Score: 6    End of Session   Activity Tolerance: Patient tolerated treatment well Patient left: in bed;with call bell/phone within reach;with bed alarm set Nurse Communication: Mobility status PT Visit Diagnosis: Muscle weakness (generalized) (M62.81);Other abnormalities of gait and mobility (R26.89);Difficulty in walking, not elsewhere classified (R26.2)    Time: 0981-1914 PT Time Calculation (min) (ACUTE ONLY): 28 min   Charges:   PT  Evaluation $PT Eval Moderate Complexity: 1 Mod PT Treatments $Therapeutic Exercise: 8-22 mins       Verner Mould, DPT Acute Rehabilitation Services  Office [phone] Pager [phone]  11/08/2019 5:34 PM

## 2019-11-08 NOTE — Progress Notes (Signed)
Nephrology Follow-Up Consult note   Assessment/Recommendations: [name] [name] is a/an 64 y.o. female with a past medical history significant for DM 2, morbid obesity, HTN, neuropathy, CKD, admitted for altered mental status.     Non-Oliguric AKI on CKD: Likely ATN in the setting of chlorthalidone, hypotension, UTI with poor p.o. intake.  No signs of obstruction on CT scan.  UA with pyuria but also numerous bacteria.  Difficult to assess volume status given her body habitus.  She has mild signs of uremia including nausea and asterixis on exam.  She wants to defer dialysis as long as possible.  We will perform Lasix stress test to help with prognostication. -Lasix 160 mg x 1; monitor urine output 2 hours after administration for response -Continue to discuss dialysis daily and monitor for more urgent indications -Start midodrine 5 mg 3 times daily given borderline hypotension -Continue IV fluids for now -Continue to monitor daily Cr, Dose meds for GFR -Monitor Daily I/Os, Daily weight  -Maintain MAP>65 for optimal renal perfusion.  -Avoid nephrotoxic medications including NSAIDs and Vanc/Zosyn combo  Pyuria/UTI: Presumptive UTI but urine culture with no growth.  Numerous bacteria on urinalysis make UTI most likely.  Possible AIN but no obvious medication implicate.  Will repeat urine culture today.  Continue antibiotics per primary  AMS: Likely metabolic encephalopathy multifactorial with UTI contributing.  Mental status improved but does have some mild uremic symptoms as above.  Continue to monitor  Morbid obesity: Likely has contributed to CKD over time  Diabetes mellitus type 2: Management per primary.  Anemia: Hemoglobin 8.9.  AKI likely minimally contributing. Will obtain iron studies.   Recommendations conveyed to primary service.    [facility] 11/08/2019 1:47 PM  ___________________________________________________________  CC: AKI on  CKD  Interval History/Subjective: No acute events overnight.  Has had some slightly low blood pressures.  She states that she has some nausea as well as abdominal bloating today but has had a bowel movement.  Denies any chest pain or shortness of breath.  She does not feel confused.   Medications:  Current Facility-Administered Medications  Medication Dose Route Frequency Provider Last Rate Last Admin   0.9 %  sodium chloride infusion   Intravenous Continuous Annita Brod, MD 100 mL/hr at 11/07/19 1707 New Bag at 11/07/19 1707   acetaminophen (TYLENOL) tablet 650 mg  650 mg Oral Q6H PRN Mujtaba, Mohammadtokir, MD   650 mg at 11/07/19 1343   Or   acetaminophen (TYLENOL) suppository 650 mg  650 mg Rectal Q6H PRN Mujtaba, Mohammadtokir, MD       albuterol (PROVENTIL) (2.5 MG/3ML) 0.083% nebulizer solution 2.5 mg  2.5 mg Nebulization Q2H PRN Mujtaba, Mohammadtokir, MD       atorvastatin (LIPITOR) tablet 10 mg  10 mg Oral Daily Mujtaba, Mohammadtokir, MD   10 mg at 11/08/19 1004   cefTRIAXone (ROCEPHIN) 1 g in sodium chloride 0.9 % 100 mL IVPB  1 g Intravenous Q24H Mujtaba, Mohammadtokir, MD 200 mL/hr at 11/08/19 0518 1 g at 11/08/19 0518   Chlorhexidine Gluconate Cloth 2 % PADS 6 each  6 each Topical Daily Annita Brod, MD   6 each at 11/08/19 1004   furosemide (LASIX) 160 mg in dextrose 5 % 50 mL IVPB  160 mg Intravenous Once Reesa Chew, MD 66 mL/hr at 11/08/19 1338 160 mg at 11/08/19 1338   heparin injection 5,000 Units  5,000 Units Subcutaneous Q8H Mujtaba, Mohammadtokir, MD   5,000 Units  at 11/08/19 1326   HYDROcodone-acetaminophen (NORCO/VICODIN) 5-325 MG per tablet 1 tablet  1 tablet Oral Q8H PRN Annita Brod, MD       insulin aspart (novoLOG) injection 0-6 Units  0-6 Units Subcutaneous TID WC Barb Merino, MD       metroNIDAZOLE (FLAGYL) IVPB 500 mg  500 mg Intravenous Q8H Mujtaba, Mohammadtokir, MD 100 mL/hr at 11/08/19 1326 500 mg at 11/08/19 1326    midodrine (PROAMATINE) tablet 5 mg  5 mg Oral TID WC Reesa Chew, MD       ondansetron [facility]) injection 4 mg  4 mg Intravenous Q6H PRN Annita Brod, MD   4 mg at 11/07/19 1755   senna (SENOKOT) tablet 8.6 mg  1 tablet Oral Daily Mujtaba, Mohammadtokir, MD   8.6 mg at 11/08/19 1003   sodium chloride 0.9 % bolus 500 mL  500 mL Intravenous Once Mujtaba, Mohammadtokir, MD       sodium chloride flush (NS) 0.9 % injection 10-40 mL  10-40 mL Intracatheter PRN Annita Brod, MD          Review of Systems: 10 systems reviewed and negative except per interval history/subjective  Physical Exam: Vitals:   11/08/19 0436 11/08/19 1311  BP: (!) 95/43 (!) 111/48  Pulse: 67 70  Resp: 17 20  Temp: 98 F (36.7 C) 98.4 F (36.9 C)  SpO2: 100% 91%   No intake/output data recorded.  Intake/Output Summary (Last 24 hours) at 11/08/2019 1347 Last data filed at 11/08/2019 0436 Gross per 24 hour  Intake 1540 ml  Output 350 ml  Net 1190 ml   Constitutional: Morbidly obese, no distress ENMT: ears and nose without scars or lesions, MMM CV: normal rate, trace lower extremity edema Respiratory: Lying flat, bilateral chest rise, normal work of breathing Gastrointestinal: soft, non-tender, no palpable masses or hernias Skin: no visible lesions or rashes Psych: alert, judgement/insight appropriate, appropriate mood and affect Neuro: Asterixis present   Test Results I personally reviewed new and old clinical labs and radiology tests Lab Results  Component Value Date   NA 139 11/08/2019   K 4.9 11/08/2019   CL 105 11/08/2019   CO2 23 11/08/2019   BUN 62 (H) 11/08/2019   CREATININE 7.15 (H) 11/08/2019   CALCIUM 8.8 (L) 11/08/2019   ALBUMIN 2.7 (L) 11/08/2019   PHOS 1.9 (L) 07/01/2014

## 2019-11-08 NOTE — Progress Notes (Signed)
Pt alert and aware in bed. She states she is doing so/so. I asked if I could do anything for her?  She said pray. The chaplain offered caring and supportive presence, sacred music and prayer and blessings. Further visit will be offered.

## 2019-11-08 NOTE — NC FL2 (Signed)
[place] LEVEL OF CARE SCREENING TOOL     IDENTIFICATION  Patient Name: [name] [name] Birthdate: January 07, 1956 Sex: female Admission Date (Current Location): 11/05/2019  [facility] and [place] Number:  Herbalist and Address:  [facility],  [place] [address] [place]      Provider Number: [id number]  Attending Physician Name and Address:  Barb Merino, MD  Relative Name and Phone Number:  Frederick Peers [phone]  [phone] Sister [phone]  [phone], or [place] Sister [phone]  [phone]    Current Level of Care: Hospital Recommended Level of Care: [place] Prior Approval Number:    Date Approved/Denied:   PASRR Number: [id number]  Discharge Plan: SNF    Current Diagnoses: Patient Active Problem List   Diagnosis Date Noted  . Encephalopathy acute 11/06/2019  . Vaginal discharge 11/06/2019  . GI bleeding 10/19/2019  . Nausea & vomiting 10/19/2019  . Morbid obesity ([place]) 08/22/2019  . AKI (acute kidney injury) ([place]) 08/21/2019  . Multiple wounds 08/21/2019  . Opiate overdose ([place]) 08/20/2019  . Cellulitis 10/05/2018  . Myalgia 10/05/2018  . Acute cystitis without hematuria 10/05/2018  . Essential hypertension 10/05/2018  . Pressure injury of skin 10/05/2018  . Hypoglycemia secondary to sulfonylurea 11/21/2016  . CKD (chronic kidney disease) stage 4, GFR 15-29 ml/min (HCC) 11/21/2016  . Substance induced mood disorder ([place]) 07/02/2014  . Acute renal failure syndrome ([place])   . Encephalopathy   . Hyperkalemia   . Hypokalemia   . Leukocytosis   . Type 2 diabetes mellitus with stage 4 chronic kidney disease ([place])   . HLD (hyperlipidemia)   . Metabolic encephalopathy   . Depression   . Anxiety state   . Acute renal failure ([place]) 06/28/2014    Orientation RESPIRATION BLADDER Height & Weight     Self, Time, Situation, Place  Normal Continent Weight: (!) 407 lb  (184.6 kg) Height:  5\' 2"  (157.5 cm)  BEHAVIORAL SYMPTOMS/MOOD NEUROLOGICAL BOWEL NUTRITION STATUS      Incontinent Diet (Mechanical Soft diet)  AMBULATORY STATUS COMMUNICATION OF NEEDS Skin   Limited Assist Verbally PU Stage and Appropriate Care   PU Stage 2 Dressing: Daily                   Personal Care Assistance Level of Assistance  Bathing, Feeding, Dressing Bathing Assistance: Limited assistance Feeding assistance: Independent Dressing Assistance: Limited assistance     Functional Limitations Info  Sight, Hearing, Speech Sight Info: Adequate Hearing Info: Adequate Speech Info: Adequate    SPECIAL CARE FACTORS FREQUENCY  PT (By licensed PT), OT (By licensed OT)     PT Frequency: Minimum 5x a week OT Frequency: Minimum 5x a week            Contractures Contractures Info: Not present    Additional Factors Info  Code Status, Allergies, Insulin Sliding Scale Code Status Info: Full Code Allergies Info: Morphine Penicillins   Insulin Sliding Scale Info: insulin aspart (novoLOG) injection 0-6 Units 3x a day.       Current Medications (11/08/2019):  This is the current hospital active medication list Current Facility-Administered Medications  Medication Dose Route Frequency Provider Last Rate Last Admin  . 0.9 %  sodium chloride infusion   Intravenous Continuous Annita Brod, MD 100 mL/hr at 11/07/19 1707 New Bag at 11/07/19 1707  . acetaminophen (TYLENOL) tablet 650 mg  650 mg Oral Q6H PRN Mujtaba, Mohammadtokir, MD   650 mg at  11/07/19 1343   Or  . acetaminophen (TYLENOL) suppository 650 mg  650 mg Rectal Q6H PRN Mujtaba, Mohammadtokir, MD      . albuterol (PROVENTIL) (2.5 MG/3ML) 0.083% nebulizer solution 2.5 mg  2.5 mg Nebulization Q2H PRN Mujtaba, Mohammadtokir, MD      . atorvastatin (LIPITOR) tablet 10 mg  10 mg Oral Daily Mujtaba, Mohammadtokir, MD   10 mg at 11/08/19 1004  . cefTRIAXone (ROCEPHIN) 1 g in sodium chloride 0.9 % 100 mL IVPB  1 g  Intravenous Q24H Mujtaba, Mohammadtokir, MD 200 mL/hr at 11/08/19 0518 1 g at 11/08/19 0518  . Chlorhexidine Gluconate Cloth 2 % PADS 6 each  6 each Topical Daily Annita Brod, MD   6 each at 11/08/19 1004  . heparin injection 5,000 Units  5,000 Units Subcutaneous Q8H Mujtaba, Mohammadtokir, MD   5,000 Units at 11/08/19 1326  . HYDROcodone-acetaminophen (NORCO/VICODIN) 5-325 MG per tablet 1 tablet  1 tablet Oral Q8H PRN Annita Brod, MD      . insulin aspart (novoLOG) injection 0-6 Units  0-6 Units Subcutaneous TID WC Barb Merino, MD      . metroNIDAZOLE (FLAGYL) IVPB 500 mg  500 mg Intravenous Q8H Mujtaba, Mohammadtokir, MD 100 mL/hr at 11/08/19 1326 500 mg at 11/08/19 1326  . midodrine (PROAMATINE) tablet 5 mg  5 mg Oral TID WC Reesa Chew, MD      . ondansetron [facility]) injection 4 mg  4 mg Intravenous Q6H PRN Annita Brod, MD   4 mg at 11/07/19 1755  . senna (SENOKOT) tablet 8.6 mg  1 tablet Oral Daily Mujtaba, Mohammadtokir, MD   8.6 mg at 11/08/19 1003  . sodium chloride 0.9 % bolus 500 mL  500 mL Intravenous Once Mujtaba, Mohammadtokir, MD      . sodium chloride flush (NS) 0.9 % injection 10-40 mL  10-40 mL Intracatheter PRN Annita Brod, MD         Discharge Medications: Please see discharge summary for a list of discharge medications.  Relevant Imaging Results:  Relevant Lab Results:   Additional Information SSN [id number]  Ross Ludwig, LCSW

## 2019-11-08 NOTE — TOC Initial Note (Signed)
Transition of Care [facility]) - Initial/Assessment Note    Patient Details  Name: [name] [name] MRN: [id number] Date of Birth: 1955/06/11  Transition of Care [facility]) CM/SW Contact:    Ross Ludwig, LCSW Phone Number: 11/08/2019, 3:08 PM  Clinical Narrative:                  Patient is a 64 year old female who is alert and oriented x4.  Patient is a short term rehab patient at [facility].  CSW reviewed chart due to patient sleeping to complete assessment.  CSW reviewed that patient plans to return back to SNF to continue with her therapy.  Patient will have to get insurance authorization again in order for her to return to SNF.  CSW to continue to follow patient's progress throughout discharge planning.  Expected Discharge Plan: Skilled Nursing Facility Barriers to Discharge: Continued Medical Work up, Ship broker   Patient Goals and CMS Choice Patient states their goals for this hospitalization and ongoing recovery are:: To return to [facility] and continue with therapy. CMS Medicare.gov Compare Post Acute Care list provided to:: Patient Choice offered to / list presented to : Patient  Expected Discharge Plan and Services Expected Discharge Plan: [place] Choice: [place] arrangements for the past 2 months: [place], [place] Expected Discharge Date:  (unknown)                 DME Agency: NA       HH Arranged: NA          Prior Living Arrangements/Services Living arrangements for the past 2 months: [place], [place] Lives with:: Self Patient language and need for interpreter reviewed:: Yes Do you feel safe going back to the place where you live?: No   Patient feels she needs to continue with therapy before she is able to return back home.  Need for Family Participation in Patient Care: Yes (Comment) Care giver support system in place?: Yes  (comment)   Criminal Activity/Legal Involvement Pertinent to Current Situation/Hospitalization: No - Comment as needed  Activities of Daily Living Home Assistive Devices/Equipment: Eyeglasses, Other (Comment) ([facility] has necessary equipment for their residents) ADL Screening (condition at time of admission) Patient's cognitive ability adequate to safely complete daily activities?: No (patient very lethargic) Is the patient deaf or have difficulty hearing?: No Does the patient have difficulty seeing, even when wearing glasses/contacts?: No Does the patient have difficulty concentrating, remembering, or making decisions?: Yes Patient able to express need for assistance with ADLs?: No Does the patient have difficulty dressing or bathing?: Yes Independently performs ADLs?: No Communication: Independent Dressing (OT): Dependent Is this a change from baseline?: Change from baseline, expected to last >3 days Grooming: Dependent Is this a change from baseline?: Change from baseline, expected to last >3 days Feeding: Dependent Is this a change from baseline?: Change from baseline, expected to last >3 days Bathing: Dependent Is this a change from baseline?: Change from baseline, expected to last >3 days Toileting: Dependent Is this a change from baseline?: Change from baseline, expected to last >3days In/Out Bed: Dependent Is this a change from baseline?: Change from baseline, expected to last >3 days Walks in Home: Dependent Is this a change from baseline?: Change from baseline, expected to last >3 days Does the patient have difficulty walking or climbing stairs?: Yes (secondary to weakness and lethargy) Weakness of Legs: Both Weakness  of Arms/Hands: Both  Permission Sought/Granted Permission sought to share information with : Facility Sport and exercise psychologist, Family Supports Permission granted to share information with : Yes, Verbal Permission Granted  Share Information with  NAME: Frederick Peers [phone]  [phone] Sister [phone]  [phone], or Shaw,Shirley Sister [phone]  [phone]  Permission granted to share info w AGENCY: SNF admissions        Emotional Assessment Appearance:: Appears older than stated age   Affect (typically observed): Accepting, Appropriate, Calm Orientation: : Oriented to Self, Oriented to Place, Oriented to  Time, Oriented to Situation Alcohol / Substance Use: Not Applicable Psych Involvement: No (comment)  Admission diagnosis:  Encephalopathy acute [G93.40] AKI (acute kidney injury) ([place]) [N17.9] Acute renal failure, unspecified acute renal failure type ([place]) [N17.9] Altered mental status, unspecified altered mental status type [R41.82] Patient Active Problem List   Diagnosis Date Noted  . Encephalopathy acute 11/06/2019  . Vaginal discharge 11/06/2019  . GI bleeding 10/19/2019  . Nausea & vomiting 10/19/2019  . Morbid obesity ([place]) 08/22/2019  . AKI (acute kidney injury) ([employer]) 08/21/2019  . Multiple wounds 08/21/2019  . Opiate overdose ([place]) 08/20/2019  . Cellulitis 10/05/2018  . Myalgia 10/05/2018  . Acute cystitis without hematuria 10/05/2018  . Essential hypertension 10/05/2018  . Pressure injury of skin 10/05/2018  . Hypoglycemia secondary to sulfonylurea 11/21/2016  . CKD (chronic kidney disease) stage 4, GFR 15-29 ml/min (HCC) 11/21/2016  . Substance induced mood disorder ([place]) 07/02/2014  . Acute renal failure syndrome ([place])   . Encephalopathy   . Hyperkalemia   . Hypokalemia   . Leukocytosis   . Type 2 diabetes mellitus with stage 4 chronic kidney disease ([place])   . HLD (hyperlipidemia)   . Metabolic encephalopathy   . Depression   . Anxiety state   . Acute renal failure ([place]) 06/28/2014   PCP:  [name], MD Pharmacy:   [facility] [phone] [place], [place]  [id number] Phone: [phone] Fax: [phone]     Social Determinants of Health (SDOH) Interventions    Readmission Risk Interventions Readmission Risk Prevention Plan 11/08/2019  Transportation Screening Complete  Medication Review (RN Care Manager) Referral to Pharmacy  PCP or Specialist appointment within 3-5 days of discharge Complete  HRI or Home Care Consult Complete  SW Recovery Care/Counseling Consult Complete  Palliative Care Screening Not [place] Complete  Some recent data might be hidden

## 2019-11-09 LAB — RENAL FUNCTION PANEL
Albumin: 2.6 g/dL — ABNORMAL LOW (ref 3.5–5.0)
Anion gap: 12 (ref 5–15)
BUN: 61 mg/dL — ABNORMAL HIGH (ref 8–23)
CO2: 22 mmol/L (ref 22–32)
Calcium: 9.4 mg/dL (ref 8.9–10.3)
Chloride: 106 mmol/L (ref 98–111)
Creatinine, Ser: 6.86 mg/dL — ABNORMAL HIGH (ref 0.44–1.00)
GFR calc Af Amer: 7 mL/min — ABNORMAL LOW (ref >60)
GFR calc non Af Amer: 6 mL/min — ABNORMAL LOW (ref >60)
Glucose, Bld: 82 mg/dL (ref 70–99)
Phosphorus: 8.4 mg/dL — ABNORMAL HIGH (ref 2.5–4.6)
Potassium: 4.6 mmol/L (ref 3.5–5.1)
Sodium: 140 mmol/L (ref 135–145)

## 2019-11-09 LAB — CBC
HCT: 28.1 % — ABNORMAL LOW (ref 36.0–46.0)
Hemoglobin: 8.3 g/dL — ABNORMAL LOW (ref 12.0–15.0)
MCH: 24.9 pg — ABNORMAL LOW (ref 26.0–34.0)
MCHC: 29.5 g/dL — ABNORMAL LOW (ref 30.0–36.0)
MCV: 84.4 fL (ref 80.0–100.0)
Platelets: 608 10*3/uL — ABNORMAL HIGH (ref 150–400)
RBC: 3.33 MIL/uL — ABNORMAL LOW (ref 3.87–5.11)
RDW: 16.6 % — ABNORMAL HIGH (ref 11.5–15.5)
WBC: 6.7 10*3/uL (ref 4.0–10.5)
nRBC: 0.7 % — ABNORMAL HIGH (ref 0.0–0.2)

## 2019-11-09 LAB — URINE CULTURE

## 2019-11-09 LAB — GLUCOSE, CAPILLARY
Glucose-Capillary: 69 mg/dL — ABNORMAL LOW (ref 70–99)
Glucose-Capillary: 88 mg/dL (ref 70–99)
Glucose-Capillary: 84 mg/dL (ref 70–99)
Glucose-Capillary: 102 mg/dL — ABNORMAL HIGH (ref 70–99)

## 2019-11-09 NOTE — Progress Notes (Signed)
PROGRESS NOTE    [name] [name]  TDV:[id number] DOB: Apr 09, 1955 DOA: 11/05/2019 PCP: [name], MD    Brief Narrative:  64 year old female with morbid obesity, hypertension, type 2 diabetes, stage IV chronic kidney disease with baseline creatinine about 1.7, recent left leg injury and fracture recovering at the skilled nursing facility brought to the ER on 9/14 for confusion.  Patient was reportedly unresponsive to verbal stimuli and subsequently was incoherent.  In the emergency room, she improved spontaneously and responded to questions appropriately.  Patient is on multiple medications including narcotics and benzodiazepine.  Admitted with mild acute metabolic encephalopathy and acute renal failure.   Assessment & Plan:   Principal Problem:   Acute renal failure (HCC) Active Problems:   Type 2 diabetes mellitus with stage 4 chronic kidney disease (HCC)   Metabolic encephalopathy   CKD (chronic kidney disease) stage 4, GFR 15-29 ml/min (HCC)   Pressure injury of skin   Morbid obesity (HCC)   Encephalopathy acute   Vaginal discharge  Acute renal failure: Thought to be prerenal with underlying history of stage IV chronic kidney disease due to diabetes. Significant abnormal renal function.  Attributed to multifactorial from use of multiple medications, chlorthalidone and low blood pressure.  In the nursing home she was on multiple CNS medication including Xanax, Cymbalta, Neurontin, hydroxyzine and Percocet. Followed by nephrology Lasix challenge, 9/17 resulted in 500 mL urine.  No evidence of uremia.  Potassium is normal. Mentation has improved.  Hopefully wait for recovery.  Anemia of chronic disease, iron deficiency anemia: May benefit with iron infusion.  Will defer to nephrology.  Acute metabolic encephalopathy: Suspect polypharmacy.  Patient on Xanax, Cymbalta, Neurontin, hydroxyzine and Percocet.  No focal neurological deficit.  Mental status improved.  Discontinued  all sedating medications.  Started on low-dose Percocet.  We will continue.  Type 2 diabetes: With hypoglycemic episodes.  On very sensitive sliding scale and regular diet.  Stabilizing.  Morbid obesity: BMI 74.  High risk of mortality and morbidity.  UTI: Present on admission.  Sepsis ruled out.  On IV Rocephin.  Urine culture with multiple species.  Received Rocephin for 3 days.  Discontinue further doses.   DVT prophylaxis: heparin injection 5,000 Units Start: 11/06/19 0600   Code Status: Full code Family Communication: Sister [name] on the phone Disposition Plan: Status is: Inpatient  Remains inpatient appropriate because:IV treatments appropriate due to intensity of illness or inability to take PO and Inpatient level of care appropriate due to severity of illness   Dispo: The patient is from: SNF              Anticipated d/c is to: SNF              Anticipated d/c date is: 2-3 days. start working with PT OT.  Discharge back to skilled nursing home when renal function stabilizes.              Patient currently is not medically stable to d/c.   Consultants:   Nephrology  Procedures:   None  Antimicrobials:   Rocephin, 9/14--- 9/18.   Subjective: Seen and examined.  No overnight events.  Awake alert.  She woke up with some nightmares but otherwise no evidence of encephalopathy. Denies any nausea vomiting.  Denies any shortness of breath.  Was excited to work with PT to get out of the bed to the side. Patient agreed not to start any medications but low-dose narcotics for her left leg pain  with me. However, with nursing staff she was always asking for more medications including benzodiazepine.  Objective: Vitals:   11/08/19 0436 11/08/19 1311 11/08/19 2056 11/09/19 0520  BP: (!) 95/43 (!) 111/48 (!) 107/53 (!) 117/57  Pulse: 67 70 69 67  Resp: 17 20 13 20   Temp: 98 F (36.7 C) 98.4 F (36.9 C) 98.2 F (36.8 C) 98.5 F (36.9 C)  TempSrc: Oral Oral Oral Oral   SpO2: 100% 91% 96% 100%  Weight:      Height:        Intake/Output Summary (Last 24 hours) at 11/09/2019 1037 Last data filed at 11/09/2019 1000 Gross per 24 hour  Intake 1542 ml  Output 500 ml  Net 1042 ml   Filed Weights   11/06/19 1830  Weight: (!) 184.6 kg    Examination:  General exam: Appears calm and comfortable  Morbidly obese and chronically sick looking lady on room air.  Communicating well.  Not in any distress. Respiratory system: Clear to auscultation. Respiratory effort normal. Cardiovascular system: S1 & S2 heard, RRR. No JVD, murmurs, rubs, gallops or clicks. No pedal edema. Gastrointestinal system: Abdomen is nondistended, soft and nontender. No organomegaly or masses felt. Normal bowel sounds heard.  Obese and pendulous. Foley catheter with clear urine. Central nervous system: Alert and oriented. No focal neurological deficits. Extremities: Symmetric 5 x 5 power. Skin: No rashes, lesions or ulcers Psychiatry: Judgement and insight appear normal. Mood & affect appropriate.     Data Reviewed: I have personally reviewed following labs and imaging studies  CBC: Recent Labs  Lab 11/05/19 2216 11/06/19 0613 11/07/19 0459 11/08/19 0310 11/09/19 0831  WBC 8.0 7.4 6.1 7.1 6.7  HGB 9.0* 9.1* 8.9* 8.4* 8.3*  HCT 30.2* 30.5* 29.6* 28.1* 28.1*  MCV 84.6 84.0 84.1 84.4 84.4  PLT 693* 540* 651* 665* 950*   Basic Metabolic Panel: Recent Labs  Lab 11/05/19 2216 11/06/19 1751 11/07/19 0459 11/08/19 0310 11/09/19 0831  NA 139 138 138 139 140  K 5.1 4.8 5.1 4.9 4.6  CL 100 101 103 105 106  CO2 25 24 23 23 22   GLUCOSE 116* 114* 85 84 82  BUN 62* 58* 65* 62* 61*  CREATININE 6.22* 6.18* 6.92* 7.15* 6.86*  CALCIUM 9.3 8.5* 8.8* 8.8* 9.4  PHOS  --   --   --   --  8.4*   GFR: Estimated Creatinine Clearance: 13.2 mL/min (A) (by C-G formula based on SCr of 6.86 mg/dL (H)). Liver Function Tests: Recent Labs  Lab 11/05/19 2216 11/06/19 1751 11/07/19 0459  11/08/19 0310 11/09/19 0831  AST 17 14* 11* 12*  --   ALT 17 15 15 15   --   ALKPHOS 104 94 97 89  --   BILITOT 0.4 0.4 0.6 0.5  --   PROT 6.5 6.0* 6.3* 6.1*  --   ALBUMIN 3.1* 2.7* 2.8* 2.7* 2.6*   No results for input(s): LIPASE, AMYLASE in the last 168 hours. Recent Labs  Lab 11/05/19 2216  AMMONIA 36*   Coagulation Profile: Recent Labs  Lab 11/06/19 0613  INR 1.0   Cardiac Enzymes: No results for input(s): CKTOTAL, CKMB, CKMBINDEX, TROPONINI in the last 168 hours. BNP (last 3 results) No results for input(s): PROBNP in the last 8760 hours. HbA1C: No results for input(s): HGBA1C in the last 72 hours. CBG: Recent Labs  Lab 11/08/19 0731 11/08/19 1149 11/08/19 1657 11/08/19 2157 11/09/19 0743  GLUCAP 72 111* 94 98 69*   Lipid Profile:  No results for input(s): CHOL, HDL, LDLCALC, TRIG, CHOLHDL, LDLDIRECT in the last 72 hours. Thyroid Function Tests: No results for input(s): TSH, T4TOTAL, FREET4, T3FREE, THYROIDAB in the last 72 hours. Anemia Panel: Recent Labs    11/08/19 1430  FERRITIN 9*  TIBC 415  IRON 31   Sepsis Labs: Recent Labs  Lab 11/06/19 0932  LATICACIDVEN 0.8    Recent Results (from the past 240 hour(s))  SARS Coronavirus 2 by RT PCR (hospital order, performed in [facility] hospital lab) Nasopharyngeal Nasopharyngeal Swab     Status: None   Collection Time: 11/05/19  9:48 PM   Specimen: Nasopharyngeal Swab  Result Value Ref Range Status   SARS Coronavirus 2 NEGATIVE NEGATIVE Final    Comment: (NOTE) SARS-CoV-2 target nucleic acids are NOT DETECTED.  The SARS-CoV-2 RNA is generally detectable in upper and lower respiratory specimens during the acute phase of infection. The lowest concentration of SARS-CoV-2 viral copies this assay can detect is 250 copies / mL. A negative result does not preclude SARS-CoV-2 infection and should not be used as the sole basis for treatment or other patient management decisions.  A negative result may  occur with improper specimen collection / handling, submission of specimen other than nasopharyngeal swab, presence of viral mutation(s) within the areas targeted by this assay, and inadequate number of viral copies (<250 copies / mL). A negative result must be combined with clinical observations, patient history, and epidemiological information.  Fact Sheet for Patients:   StrictlyIdeas.no  Fact Sheet for Healthcare Providers: BankingDealers.co.za  This test is not yet approved or  cleared by the [place] FDA and has been authorized for detection and/or diagnosis of SARS-CoV-2 by FDA under an Emergency Use Authorization (EUA).  This EUA will remain in effect (meaning this test can be used) for the duration of the COVID-19 declaration under Section 564(b)(1) of the Act, 21 U.S.C. section 360bbb-3(b)(1), unless the authorization is terminated or revoked sooner.  Performed at [facility], [place] [address] [place], [place] [address]   Culture, Urine     Status: Abnormal   Collection Time: 11/08/19  1:49 PM   Specimen: Urine, Catheterized  Result Value Ref Range Status   Specimen Description   Final    URINE, CATHETERIZED Performed at [facility] [address] [place], [place] [address]    Special Requests   Final    NONE Performed at [facility], [place] [address] [place], [place] [address]    Culture MULTIPLE SPECIES PRESENT, SUGGEST RECOLLECTION (A)  Final   Report Status 11/09/2019 FINAL  Final         Radiology Studies: No results found.      Scheduled Meds: . atorvastatin  10 mg Oral Daily  . Chlorhexidine Gluconate Cloth  6 each Topical Daily  . heparin  5,000 Units Subcutaneous Q8H  . insulin aspart  0-6 Units Subcutaneous TID WC  . midodrine  5 mg Oral TID WC  . senna  1 tablet Oral Daily   Continuous Infusions: . sodium chloride 100 mL/hr at  11/07/19 1707  . ferumoxytol 510 mg (11/08/19 1615)  . metronidazole 500 mg (11/09/19 0523)  . sodium chloride       LOS: 3 days    Time spent: 30 minutes    Barb Merino, MD [facility] Pager [phone]

## 2019-11-09 NOTE — Progress Notes (Signed)
Nephrology Follow-Up Consult note   Assessment/Recommendations: [name] [name] is a/an 64 y.o. female with a past medical history significant for DM 2, morbid obesity, HTN, neuropathy, CKD, admitted for altered mental status.     Non-Oliguric AKI on CKD: Likely ATN in the setting of chlorthalidone, hypotension, UTI with poor p.o. intake.  No signs of obstruction on CT scan.  UA with pyuria but also numerous bacteria.  Difficult to assess volume status given her body habitus.  Uremic symptoms slightly improved today.  Wanting to defer dialysis as long as possible.  Borderline response to Lasix stress test yesterday with exactly 200 cc urine output at 2 hours.  Hopefully this confers a good prognosis for recovery with time.  Creatinine stable today.  -Continue to discuss dialysis daily and monitor for more urgent indications -Continue midodrine 5 mg 3 times daily given borderline hypotension -Continue IV fluids for now -Continue to monitor daily Cr, Dose meds for GFR -Monitor Daily I/Os, Daily weight  -Maintain MAP>65 for optimal renal perfusion.  -Avoid nephrotoxic medications including NSAIDs and Vanc/Zosyn combo  Pyuria/UTI: Presumptive UTI but urine culture with no growth.  Numerous bacteria on urinalysis make UTI most likely.  Possible AIN but no obvious medication implicate.  Repeat urine culture with many bacteria recommended repeating again.  We will continue to monitor.  Antibiotics per primary team  AMS: Likely metabolic encephalopathy multifactorial with UTI contributing.  Mental status improved with minimal uremic symptoms at this time.  Morbid obesity: Likely has contributed to CKD over time  Diabetes mellitus type 2: Management per primary.  Anemia: Hemoglobin 8.3.  Iron sat 7 and ferritin 9.  Status post Feraheme 1 dose yesterday repeat dose in 1 week.     Recommendations conveyed to primary service.    [facility] 11/09/2019 11:31  AM  ___________________________________________________________  CC: AKI on CKD  Interval History/Subjective: No acute events overnight.  The patient states that she feels relatively well today.  She does not have as much abdominal pain.  Denies significant nausea.  Medications:  Current Facility-Administered Medications  Medication Dose Route Frequency Provider Last Rate Last Admin  . 0.9 %  sodium chloride infusion   Intravenous Continuous Annita Brod, MD 100 mL/hr at 11/07/19 1707 New Bag at 11/07/19 1707  . acetaminophen (TYLENOL) tablet 650 mg  650 mg Oral Q6H PRN Mujtaba, Mohammadtokir, MD   650 mg at 11/07/19 1343   Or  . acetaminophen (TYLENOL) suppository 650 mg  650 mg Rectal Q6H PRN Mujtaba, Mohammadtokir, MD      . albuterol (PROVENTIL) (2.5 MG/3ML) 0.083% nebulizer solution 2.5 mg  2.5 mg Nebulization Q2H PRN Mujtaba, Mohammadtokir, MD      . atorvastatin (LIPITOR) tablet 10 mg  10 mg Oral Daily Mujtaba, Mohammadtokir, MD   10 mg at 11/09/19 9562  . Chlorhexidine Gluconate Cloth 2 % PADS 6 each  6 each Topical Daily Annita Brod, MD   6 each at 11/09/19 0913  . ferumoxytol (FERAHEME) 510 mg in sodium chloride 0.9 % 100 mL IVPB  510 mg Intravenous Weekly Reesa Chew, MD 468 mL/hr at 11/08/19 1615 510 mg at 11/08/19 1615  . heparin injection 5,000 Units  5,000 Units Subcutaneous Q8H Mujtaba, Mohammadtokir, MD   5,000 Units at 11/09/19 0525  . HYDROcodone-acetaminophen (NORCO/VICODIN) 5-325 MG per tablet 1 tablet  1 tablet Oral Q8H PRN Annita Brod, MD   1 tablet at 11/09/19 0930  . insulin aspart (novoLOG) injection  0-6 Units  0-6 Units Subcutaneous TID WC Ghimire, Dante Gang, MD      . lip balm (CARMEX) ointment   Topical PRN Barb Merino, MD      . metroNIDAZOLE (FLAGYL) IVPB 500 mg  500 mg Intravenous Q8H Mujtaba, Mohammadtokir, MD 100 mL/hr at 11/09/19 0523 500 mg at 11/09/19 0523  . midodrine (PROAMATINE) tablet 5 mg  5 mg Oral TID WC Reesa Chew,  MD   5 mg at 11/09/19 0839  . ondansetron (ZOFRAN) injection 4 mg  4 mg Intravenous Q6H PRN Annita Brod, MD   4 mg at 11/08/19 2057  . senna (SENOKOT) tablet 8.6 mg  1 tablet Oral Daily Mujtaba, Mohammadtokir, MD   8.6 mg at 11/08/19 1003  . sodium chloride 0.9 % bolus 500 mL  500 mL Intravenous Once Mujtaba, Mohammadtokir, MD      . sodium chloride flush (NS) 0.9 % injection 10-40 mL  10-40 mL Intracatheter PRN Annita Brod, MD          Review of Systems: 10 systems reviewed and negative except per interval history/subjective  Physical Exam: Vitals:   11/08/19 2056 11/09/19 0520  BP: (!) 107/53 (!) 117/57  Pulse: 69 67  Resp: 13 20  Temp: 98.2 F (36.8 C) 98.5 F (36.9 C)  SpO2: 96% 100%   Total I/O In: 240 [P.O.:240] Out: -   Intake/Output Summary (Last 24 hours) at 11/09/2019 1131 Last data filed at 11/09/2019 1000 Gross per 24 hour  Intake 1542 ml  Output 500 ml  Net 1042 ml   Constitutional: Morbidly obese, no distress ENMT: ears and nose without scars or lesions, MMM CV: normal rate, trace lower extremity edema Respiratory: Lying flat, bilateral chest rise, normal work of breathing Gastrointestinal: soft, non-tender, no palpable masses or hernias Skin: no visible lesions or rashes Psych: alert, judgement/insight appropriate, appropriate mood and affect Neuro: Asterixis present   Test Results I personally reviewed new and old clinical labs and radiology tests Lab Results  Component Value Date   NA 140 11/09/2019   K 4.6 11/09/2019   CL 106 11/09/2019   CO2 22 11/09/2019   BUN 61 (H) 11/09/2019   CREATININE 6.86 (H) 11/09/2019   CALCIUM 9.4 11/09/2019   ALBUMIN 2.6 (L) 11/09/2019   PHOS 8.4 (H) 11/09/2019

## 2019-11-10 LAB — CBC
HCT: 27.4 % — ABNORMAL LOW (ref 36.0–46.0)
Hemoglobin: 8.1 g/dL — ABNORMAL LOW (ref 12.0–15.0)
MCH: 24.9 pg — ABNORMAL LOW (ref 26.0–34.0)
MCHC: 29.6 g/dL — ABNORMAL LOW (ref 30.0–36.0)
MCV: 84.3 fL (ref 80.0–100.0)
Platelets: 612 10*3/uL — ABNORMAL HIGH (ref 150–400)
RBC: 3.25 MIL/uL — ABNORMAL LOW (ref 3.87–5.11)
RDW: 16.8 % — ABNORMAL HIGH (ref 11.5–15.5)
WBC: 8.1 10*3/uL (ref 4.0–10.5)
nRBC: 0.7 % — ABNORMAL HIGH (ref 0.0–0.2)

## 2019-11-10 LAB — URINE CULTURE: Culture: <10000 — AB

## 2019-11-10 LAB — RENAL FUNCTION PANEL
Albumin: 2.5 g/dL — ABNORMAL LOW (ref 3.5–5.0)
Anion gap: 13 (ref 5–15)
BUN: 61 mg/dL — ABNORMAL HIGH (ref 8–23)
CO2: 22 mmol/L (ref 22–32)
Calcium: 8.9 mg/dL (ref 8.9–10.3)
Chloride: 108 mmol/L (ref 98–111)
Creatinine, Ser: 6.64 mg/dL — ABNORMAL HIGH (ref 0.44–1.00)
GFR calc Af Amer: 7 mL/min — ABNORMAL LOW (ref >60)
GFR calc non Af Amer: 6 mL/min — ABNORMAL LOW (ref >60)
Glucose, Bld: 105 mg/dL — ABNORMAL HIGH (ref 70–99)
Phosphorus: 7.8 mg/dL — ABNORMAL HIGH (ref 2.5–4.6)
Potassium: 4.4 mmol/L (ref 3.5–5.1)
Sodium: 143 mmol/L (ref 135–145)

## 2019-11-10 LAB — GLUCOSE, CAPILLARY
Glucose-Capillary: 79 mg/dL (ref 70–99)
Glucose-Capillary: 96 mg/dL (ref 70–99)
Glucose-Capillary: 90 mg/dL (ref 70–99)
Glucose-Capillary: 117 mg/dL — ABNORMAL HIGH (ref 70–99)

## 2019-11-10 LAB — OCCULT BLOOD X 1 CARD TO LAB, STOOL: Fecal Occult Bld: POSITIVE — AB

## 2019-11-10 MED ORDER — GLUCERNA SHAKE PO LIQD
237.0000 mL | Freq: Two times a day (BID) | ORAL | Status: DC
  Administered 2019-11-10 – 2019-11-11 (×2): 237 mL via ORAL
  Filled 2019-11-10 (×27): qty 237

## 2019-11-10 MED ORDER — GLUCERNA PO LIQD: 237.0000 mL | Freq: Two times a day (BID) | ORAL | Status: DC

## 2019-11-10 MED ORDER — LOPERAMIDE HCL 2 MG PO CAPS
2.0000 mg | ORAL_CAPSULE | Freq: Four times a day (QID) | ORAL | Status: DC | PRN
  Administered 2019-11-10 – 2019-11-19 (×3): 2 mg via ORAL
  Filled 2019-11-10 (×3): qty 1

## 2019-11-10 MED ORDER — SODIUM CHLORIDE 0.9 % IV SOLN: INTRAVENOUS | Status: DC

## 2019-11-10 NOTE — Progress Notes (Signed)
Nephrology Follow-Up Consult note   Assessment/Recommendations: [name] [name] is a/an 64 y.o. female with a past medical history significant for DM 2, morbid obesity, HTN, neuropathy, CKD, admitted for altered mental status.     Non-Oliguric AKI on CKD: Likely ATN in the setting of chlorthalidone, hypotension, UTI with poor p.o. intake.  No signs of obstruction on CT scan.  UA with pyuria but also numerous bacteria.  Difficult to assess volume status given her body habitus. Wanting to defer dialysis as long as possible.  Borderline response to Lasix stress test yesterday with exactly 200 cc urine output at 2 hours.  Hopefully this confers a good prognosis for recovery with time.  Creatinine seems to be slowly but surely improving.  Was, stop fluids today but given her diarrhea will continue -Continue to monitor for indications for dialysis -Blood pressure overall improved.  Can stop midodrine -Continue IV fluids for now -Continue to monitor daily Cr, Dose meds for GFR -Monitor Daily I/Os, Daily weight  -Maintain MAP>65 for optimal renal perfusion.  -Avoid nephrotoxic medications including NSAIDs and Vanc/Zosyn combo  Pyuria/UTI: Presumptive UTI but urine culture with no growth.  Numerous bacteria on urinalysis make UTI most likely.  Possible AIN but no obvious medication implicate.  Repeat urine culture with many bacteria recommended repeating again.  We will continue to monitor.  Antibiotics per primary team  Morbid obesity: Likely has contributed to CKD over time  Diabetes mellitus type 2: Management per primary.  Anemia: Hemoglobin 8.3.  Iron sat 7 and ferritin 9.  Status post Feraheme 1 dose repeat dose in 1 week.    Diarrhea: Being treated with Imodium by primary team.  Continue work-up per them.   Recommendations conveyed to primary service.    [facility] 11/10/2019 10:34 AM  ___________________________________________________________  CC:  AKI on CKD  Interval History/Subjective: Patient endorses significant amount of diarrhea over the past 24 hours.  Starting on Imodium today which hopefully will help.  Denies significant confusion.  Does have some nausea and upset stomach.  Medications:  Current Facility-Administered Medications  Medication Dose Route Frequency Provider Last Rate Last Admin  . 0.9 %  sodium chloride infusion   Intravenous Continuous Reesa Chew, MD      . acetaminophen (TYLENOL) tablet 650 mg  650 mg Oral Q6H PRN Mujtaba, Mohammadtokir, MD   650 mg at 11/07/19 1343   Or  . acetaminophen (TYLENOL) suppository 650 mg  650 mg Rectal Q6H PRN Mujtaba, Mohammadtokir, MD      . albuterol (PROVENTIL) (2.5 MG/3ML) 0.083% nebulizer solution 2.5 mg  2.5 mg Nebulization Q2H PRN Mujtaba, Mohammadtokir, MD      . atorvastatin (LIPITOR) tablet 10 mg  10 mg Oral Daily Mujtaba, Mohammadtokir, MD   10 mg at 11/10/19 0945  . Chlorhexidine Gluconate Cloth 2 % PADS 6 each  6 each Topical Daily Annita Brod, MD   6 each at 11/10/19 0946  . ferumoxytol (FERAHEME) 510 mg in sodium chloride 0.9 % 100 mL IVPB  510 mg Intravenous Weekly Reesa Chew, MD 468 mL/hr at 11/08/19 1615 510 mg at 11/08/19 1615  . heparin injection 5,000 Units  5,000 Units Subcutaneous Q8H Mujtaba, Mohammadtokir, MD   5,000 Units at 11/10/19 0538  . HYDROcodone-acetaminophen (NORCO/VICODIN) 5-325 MG per tablet 1 tablet  1 tablet Oral Q8H PRN Annita Brod, MD   1 tablet at 11/10/19 6834  . insulin aspart (novoLOG) injection 0-6 Units  0-6 Units Subcutaneous  TID WC Barb Merino, MD      . lip balm (CARMEX) ointment   Topical PRN Barb Merino, MD      . loperamide (IMODIUM) capsule 2 mg  2 mg Oral Q6H PRN Barb Merino, MD   2 mg at 11/10/19 0945  . metroNIDAZOLE (FLAGYL) IVPB 500 mg  500 mg Intravenous Q8H Mujtaba, Mohammadtokir, MD 100 mL/hr at 11/10/19 0540 500 mg at 11/10/19 0540  . ondansetron (ZOFRAN) injection 4 mg  4 mg  Intravenous Q6H PRN Annita Brod, MD   4 mg at 11/08/19 2057  . senna (SENOKOT) tablet 8.6 mg  1 tablet Oral Daily Mujtaba, Mohammadtokir, MD   8.6 mg at 11/08/19 1003  . sodium chloride 0.9 % bolus 500 mL  500 mL Intravenous Once Mujtaba, Mohammadtokir, MD      . sodium chloride flush (NS) 0.9 % injection 10-40 mL  10-40 mL Intracatheter PRN Annita Brod, MD          Review of Systems: 10 systems reviewed and negative except per interval history/subjective  Physical Exam: Vitals:   11/09/19 2312 11/10/19 0541  BP: (!) 135/58 135/66  Pulse: 70 72  Resp: 20 17  Temp: 98.2 F (36.8 C) 97.7 F (36.5 C)  SpO2: 96% 97%   No intake/output data recorded.  Intake/Output Summary (Last 24 hours) at 11/10/2019 1034 Last data filed at 11/10/2019 0500 Gross per 24 hour  Intake 2795 ml  Output 1100 ml  Net 1695 ml   Constitutional: Morbidly obese, no distress ENMT: ears and nose without scars or lesions, MMM CV: normal rate, trace lower extremity edema Respiratory: Lying flat, bilateral chest rise, normal work of breathing Gastrointestinal: soft, non-tender, no palpable masses or hernias Skin: no visible lesions or rashes Psych: alert, judgement/insight appropriate, appropriate mood and affect Neuro: Minimal asterixis present   Test Results I personally reviewed new and old clinical labs and radiology tests Lab Results  Component Value Date   NA 143 11/10/2019   K 4.4 11/10/2019   CL 108 11/10/2019   CO2 22 11/10/2019   BUN 61 (H) 11/10/2019   CREATININE 6.64 (H) 11/10/2019   CALCIUM 8.9 11/10/2019   ALBUMIN 2.5 (L) 11/10/2019   PHOS 7.8 (H) 11/10/2019

## 2019-11-10 NOTE — Progress Notes (Signed)
Pt was lying in bed when I arrived. She said she was doing okay. As we talked more she said she was depressed because she has been through so much. She described how she fell in her bathroom after aid had spilled water on the floor. She said she took one step and on the second step "it was over."  She said she is going from rehab from here and she said she didn't know what they could do saying her leg was broken in several places.  When I asked about family she said her son had just left.  She asked for prayer which we had followed by a song.  Pt was pleasant and thankful for visit. Please page if additional assistance is needed. [place], MDiv   11/10/19 1500  Clinical Encounter Type  Visited With Patient

## 2019-11-10 NOTE — Progress Notes (Signed)
Called by patient RN to assess central line dressing. Central line noted to be covered with gauze and tape over the insertion site only and no biopatch noted when dressing removed. Site cleaned well and new dressing and biopatch applied. Of note, when this writer rounded on patient yesterday (twice), there was a biopatch and transparent dressing in place. Patient stated she forgot she had a line and pulled off the dressing the original dressing but does not remember when. IV team will continue to perform routine rounds.

## 2019-11-10 NOTE — Progress Notes (Signed)
PROGRESS NOTE    [name] [name]  YHC:[id number] DOB: [birth year] DOA: 11/05/2019 PCP: [name], MD    Brief Narrative:  64 year old female with morbid obesity, hypertension, type 2 diabetes, stage IV chronic kidney disease with baseline creatinine about 1.7, recent left leg injury and fracture recovering at the skilled nursing facility brought to the ER on 9/14 for confusion.  Patient was reportedly unresponsive to verbal stimuli and subsequently was incoherent.  In the emergency room, she improved spontaneously and responded to questions appropriately.  Patient is on multiple medications including narcotics and benzodiazepine.  Admitted with mild acute metabolic encephalopathy and acute renal failure.   Assessment & Plan:   Principal Problem:   Acute renal failure (HCC) Active Problems:   Type 2 diabetes mellitus with stage 4 chronic kidney disease (HCC)   Metabolic encephalopathy   CKD (chronic kidney disease) stage 4, GFR 15-29 ml/min (HCC)   Pressure injury of skin   Morbid obesity (HCC)   Encephalopathy acute   Vaginal discharge  Acute renal failure: Thought to be prerenal with underlying history of stage IV chronic kidney disease due to diabetes. Significant abnormal renal function.  Attributed to multifactorial from use of multiple medications, chlorthalidone and low blood pressure.  In the nursing home she was on multiple CNS medication including Xanax, Cymbalta, Neurontin, hydroxyzine and Percocet. Followed by nephrology Lasix challenge, 9/17 .  Urine output improving.  Reportedly 1100 mL urine output last 24 hours. Mentation has improved.  Hopefully wait for recovery.  Anemia of chronic disease, iron deficiency anemia: 1 unit of Feraheme given in the hospital.  Acute metabolic encephalopathy: Suspect polypharmacy.  Patient on Xanax, Cymbalta, Neurontin, hydroxyzine and Percocet.  No focal neurological deficit.  Mental status improved.  Discontinued all sedating  medications.  Started on low-dose Percocet.  We will continue.  Type 2 diabetes: With hypoglycemic episodes.  On very sensitive sliding scale and regular diet.  Stabilizing.  Morbid obesity: BMI 74.  High risk of mortality and morbidity.  UTI: Present on admission.  Sepsis ruled out.  On IV Rocephin.  Urine culture with multiple species.  Received Rocephin for 3 days.  Discontinue further doses.   DVT prophylaxis: heparin injection 5,000 Units Start: 11/06/19 0600   Code Status: Full code Family Communication: Sister [name] [name] on the phone 9/18 Disposition Plan: Status is: Inpatient  Remains inpatient appropriate because:IV treatments appropriate due to intensity of illness or inability to take PO and Inpatient level of care appropriate due to severity of illness   Dispo: The patient is from: SNF              Anticipated d/c is to: SNF              Anticipated d/c date is: 2-3 days. start working with PT OT.  Discharge back to skilled nursing home when renal function stabilizes.              Patient currently is not medically stable to d/c.   Consultants:   Nephrology  Procedures:   None  Antimicrobials:   Rocephin, 9/14--- 9/18.   Subjective: Seen and examined.  No overnight events.  She had 4 loose stool last 24 hours.  Appetite is better.  Objective: Vitals:   11/09/19 0520 11/09/19 1245 11/09/19 2312 11/10/19 0541  BP: (!) 117/57 (!) 119/59 (!) 135/58 135/66  Pulse: 67 67 70 72  Resp: 20 18 20 17   Temp: 98.5 F (36.9 C) 98 F (36.7 C) 98.2  F (36.8 C) 97.7 F (36.5 C)  TempSrc: Oral Oral Oral Oral  SpO2: 100% 96% 96% 97%  Weight:      Height:        Intake/Output Summary (Last 24 hours) at 11/10/2019 1109 Last data filed at 11/10/2019 0500 Gross per 24 hour  Intake 2795 ml  Output 1100 ml  Net 1695 ml   Filed Weights   11/06/19 1830  Weight: (!) 184.6 kg    Examination:  General exam: Appears calm and comfortable  Morbidly obese and  chronically sick looking lady on room air.  Communicating well.  Not in any distress. Respiratory system: Clear to auscultation. Respiratory effort normal. Cardiovascular system: S1 & S2 heard, RRR. No JVD, murmurs, rubs, gallops or clicks. No pedal edema. Gastrointestinal system: Abdomen is nondistended, soft and nontender. No organomegaly or masses felt. Normal bowel sounds heard.  Obese and pendulous. Foley catheter with clear urine. Central nervous system: Alert and oriented. No focal neurological deficits. Extremities: Symmetric 5 x 5 power. Skin: No rashes, lesions or ulcers Psychiatry: Judgement and insight appear normal. Mood & affect appropriate.     Data Reviewed: I have personally reviewed following labs and imaging studies  CBC: Recent Labs  Lab 11/06/19 0613 11/07/19 0459 11/08/19 0310 11/09/19 0831 11/10/19 0410  WBC 7.4 6.1 7.1 6.7 8.1  HGB 9.1* 8.9* 8.4* 8.3* 8.1*  HCT 30.5* 29.6* 28.1* 28.1* 27.4*  MCV 84.0 84.1 84.4 84.4 84.3  PLT 540* 651* 665* 608* 950*   Basic Metabolic Panel: Recent Labs  Lab 11/06/19 1751 11/07/19 0459 11/08/19 0310 11/09/19 0831 11/10/19 0410  NA 138 138 139 140 143  K 4.8 5.1 4.9 4.6 4.4  CL 101 103 105 106 108  CO2 24 23 23 22 22   GLUCOSE 114* 85 84 82 105*  BUN 58* 65* 62* 61* 61*  CREATININE 6.18* 6.92* 7.15* 6.86* 6.64*  CALCIUM 8.5* 8.8* 8.8* 9.4 8.9  PHOS  --   --   --  8.4* 7.8*   GFR: Estimated Creatinine Clearance: 13.7 mL/min (A) (by C-G formula based on SCr of 6.64 mg/dL (H)). Liver Function Tests: Recent Labs  Lab 11/05/19 2216 11/05/19 2216 11/06/19 1751 11/07/19 0459 11/08/19 0310 11/09/19 0831 11/10/19 0410  AST 17  --  14* 11* 12*  --   --   ALT 17  --  15 15 15   --   --   ALKPHOS 104  --  94 97 89  --   --   BILITOT 0.4  --  0.4 0.6 0.5  --   --   PROT 6.5  --  6.0* 6.3* 6.1*  --   --   ALBUMIN 3.1*   < > 2.7* 2.8* 2.7* 2.6* 2.5*   < > = values in this interval not displayed.   No results for  input(s): LIPASE, AMYLASE in the last 168 hours. Recent Labs  Lab 11/05/19 2216  AMMONIA 36*   Coagulation Profile: Recent Labs  Lab 11/06/19 0613  INR 1.0   Cardiac Enzymes: No results for input(s): CKTOTAL, CKMB, CKMBINDEX, TROPONINI in the last 168 hours. BNP (last 3 results) No results for input(s): PROBNP in the last 8760 hours. HbA1C: No results for input(s): HGBA1C in the last 72 hours. CBG: Recent Labs  Lab 11/09/19 0743 11/09/19 1156 11/09/19 1645 11/09/19 2307 11/10/19 0737  GLUCAP 69* 88 84 102* 79   Lipid Profile: No results for input(s): CHOL, HDL, LDLCALC, TRIG, CHOLHDL, LDLDIRECT in the last  72 hours. Thyroid Function Tests: No results for input(s): TSH, T4TOTAL, FREET4, T3FREE, THYROIDAB in the last 72 hours. Anemia Panel: Recent Labs    11/08/19 1430  FERRITIN 9*  TIBC 415  IRON 31   Sepsis Labs: Recent Labs  Lab 11/06/19 4142  LATICACIDVEN 0.8    Recent Results (from the past 240 hour(s))  SARS Coronavirus 2 by RT PCR (hospital order, performed in [facility] hospital lab) Nasopharyngeal Nasopharyngeal Swab     Status: None   Collection Time: 11/05/19  9:48 PM   Specimen: Nasopharyngeal Swab  Result Value Ref Range Status   SARS Coronavirus 2 NEGATIVE NEGATIVE Final    Comment: (NOTE) SARS-CoV-2 target nucleic acids are NOT DETECTED.  The SARS-CoV-2 RNA is generally detectable in upper and lower respiratory specimens during the acute phase of infection. The lowest concentration of SARS-CoV-2 viral copies this assay can detect is 250 copies / mL. A negative result does not preclude SARS-CoV-2 infection and should not be used as the sole basis for treatment or other patient management decisions.  A negative result may occur with improper specimen collection / handling, submission of specimen other than nasopharyngeal swab, presence of viral mutation(s) within the areas targeted by this assay, and inadequate number of viral copies (<250  copies / mL). A negative result must be combined with clinical observations, patient history, and epidemiological information.  Fact Sheet for Patients:   StrictlyIdeas.no  Fact Sheet for Healthcare Providers: BankingDealers.co.za  This test is not yet approved or  cleared by the [place] FDA and has been authorized for detection and/or diagnosis of SARS-CoV-2 by FDA under an Emergency Use Authorization (EUA).  This EUA will remain in effect (meaning this test can be used) for the duration of the COVID-19 declaration under Section 564(b)(1) of the Act, 21 U.S.C. section 360bbb-3(b)(1), unless the authorization is terminated or revoked sooner.  Performed at [facility], [place] [address], [place], [place] [address]   Culture, Urine     Status: Abnormal   Collection Time: 11/08/19  1:49 PM   Specimen: Urine, Catheterized  Result Value Ref Range Status   Specimen Description   Final    URINE, CATHETERIZED Performed at [place] [address] [place], [place] [address]    Special Requests   Final    NONE Performed at [facility], [place] [address] [place], [place] [address]    Culture MULTIPLE SPECIES PRESENT, SUGGEST RECOLLECTION (A)  Final   Report Status 11/09/2019 FINAL  Final  Culture, Urine     Status: Abnormal   Collection Time: 11/09/19 12:04 PM   Specimen: Urine, Random  Result Value Ref Range Status   Specimen Description   Final    URINE, RANDOM Performed at [place] [address], [place], [place] [address]    Special Requests   Final    NONE Performed at [facility], [place] [address], [place], [place] [address]    Culture (A)  Final    <10,000 COLONIES/mL INSIGNIFICANT GROWTH Performed at [place] [address] [place],  [address]    Report Status 11/10/2019 FINAL  Final          Radiology Studies: No results found.      Scheduled Meds: . atorvastatin  10 mg Oral Daily  . Chlorhexidine Gluconate Cloth  6 each Topical Daily  . heparin  5,000 Units Subcutaneous Q8H  . insulin aspart  0-6 Units Subcutaneous TID WC  . senna  1 tablet Oral Daily   Continuous Infusions: . sodium chloride    . ferumoxytol 510 mg (11/08/19 1615)  . metronidazole 500 mg (11/10/19 0540)  . sodium chloride       LOS: 4 days    Time spent: 30 minutes    Barb Merino, MD [facility] Pager [phone]

## 2019-11-10 NOTE — Progress Notes (Signed)
Patient reported to staff that she has had another BM, patient noted to have large amount of bloody stool with dark blood clots.  MD notified, order received for stat H&H.

## 2019-11-10 NOTE — Plan of Care (Signed)
Patient continues with poor po intake, does like Glucerna.  Patient with 3 loose dark stools on 7 a to 7 p, hemoccult + MD aware.  Son in to visit.

## 2019-11-10 NOTE — Evaluation (Signed)
Occupational Therapy Evaluation Patient Details Name: [name] [name] MRN: [id number] DOB: June 09, 1955 Today's Date: 11/10/2019    History of Present Illness Patient is 64 y.o. female with PMH significant for HTN, DM, neuropathy, Lt knee fracture, CKD, obesity. Pt admitted from SNF for AMS. Per prior hospital admission  ([facility] home care called and reported unsafe living conditions as caregiver unable to assist pt).  Patient had recent hospital admission on 10/18/2019-10/20/2019 for possible GI bleed.  She was found to have severe constipation and not a GI bleed.  We also found that she had a left knee fracture, unknown when the fracture occurred sometime in August. Pt NWB on Lt LE last admission from 9/1-9/7.   Clinical Impression   Pt admitted with AMS. Pt currently with functional limitations due to the deficits listed below (see OT Problem List).  Pt will benefit from skilled OT to increase their safety and independence with ADL and functional mobility for ADL to facilitate discharge to venue listed below.      Follow Up Recommendations  SNF    Equipment Recommendations  None recommended by OT    Recommendations for Other Services       Precautions / Restrictions Precautions Precautions: Fall Restrictions Other Position/Activity Restrictions: Per last admission MD said "stay off it"  no further guidance on restrictions.  Pt was supposed to f/u Sept 7th per her report but did not.      Mobility Bed Mobility Overal bed mobility: Needs Assistance Bed Mobility: Rolling Rolling: Total assist;+2 for physical assistance;+2 for safety/equipment Sidelying to sit: Total assist;+2 for physical assistance;+2 for safety/equipment;HOB elevated   Sit to supine: Total assist;+2 for physical assistance;+2 for safety/equipment   General bed mobility comments: for hygiene  Transfers                 General transfer comment: NT due to safety concerns        ADL either  performed or assessed with clinical judgement   ADL Overall ADL's : Needs assistance/impaired Eating/Feeding: Set up;Bed level Eating/Feeding Details (indicate cue type and reason): HOB raised and BUe propped onpillows Grooming: Wash/dry face;Oral care;Bed level Grooming Details (indicate cue type and reason): HOB raised                               General ADL Comments: pt total A bed mobility for hygiene ( 3 person A     Vision Patient Visual Report: No change from baseline              Pertinent Vitals/Pain Pain Assessment: Faces Faces Pain Scale: Hurts a little bit Pain Location: feet- ask OT to put on gloves and rub them Pain Descriptors / Indicators: Sore;Grimacing Pain Intervention(s): Limited activity within patient's tolerance;Repositioned     Hand Dominance     Extremity/Trunk Assessment Upper Extremity Assessment Upper Extremity Assessment: Generalized weakness           Communication Communication Communication: No difficulties   Cognition Arousal/Alertness: Awake/alert Behavior During Therapy: WFL for tasks assessed/performed Overall Cognitive Status: Within Functional Limits for tasks assessed                                 General Comments: Pt pleasant and A&O x4   General Comments               Home Living Family/patient expects  to be discharged to:: Skilled nursing facility Living Arrangements: Alone Available Help at Discharge: Friend(s);Available PRN/intermittently Type of Home: Apartment Home Access: Ramped entrance     Home Layout: One level     Bathroom Shower/Tub: Occupational psychologist: Standard Bathroom Accessibility: No   Home Equipment: Hospital bed;Walker - 4 wheels;Wheelchair - manual   Additional Comments: pt reports she is supposed to get bariatric hospital bed; friend was coming to give her a bath and change sheets daily. bf fall pt reports she was walking and bathing self       Prior Functioning/Environment Level of Independence: Needs assistance  Gait / Transfers Assistance Needed: "Reports she is able to get out of bed and use RW around house.  Uses w/c for mobility outside of home." per notes 2 months ago; pt currently reports bedridden for last 9 weeks ADL's / Homemaking Assistance Needed: no longer has an aide to assist. Now her friend was assissitn her last. her with baths (sounds like bed baths recently), dressing.            OT Problem List: Obesity;Decreased strength;Decreased activity tolerance;Impaired balance (sitting and/or standing)      OT Treatment/Interventions: Self-care/ADL training;Patient/family education;Therapeutic activities    OT Goals(Current goals can be found in the care plan section) Acute Rehab OT Goals Patient Stated Goal: get up and move and walk again OT Goal Formulation: With patient Time For Goal Achievement: 11/24/19 Potential to Achieve Goals: Good  OT Frequency: Min 2X/week   Barriers to D/C: Decreased caregiver support             AM-PAC OT "6 Clicks" Daily Activity     Outcome Measure Help from another person eating meals?: A Little Help from another person taking care of personal grooming?: A Little Help from another person toileting, which includes using toliet, bedpan, or urinal?: Total Help from another person bathing (including washing, rinsing, drying)?: Total Help from another person to put on and taking off regular upper body clothing?: Total Help from another person to put on and taking off regular lower body clothing?: Total 6 Click Score: 10   End of Session Nurse Communication: Mobility status  Activity Tolerance: Patient limited by fatigue Patient left: in bed;with call bell/phone within reach  OT Visit Diagnosis: Muscle weakness (generalized) (M62.81);Other abnormalities of gait and mobility (R26.89);History of falling (Z91.81)                Time: 0093-8182 OT Time Calculation (min):  26 min Charges:  OT General Charges $OT Visit: 1 Visit OT Evaluation $OT Eval Moderate Complexity: 1 Mod OT Treatments $Self Care/Home Management : 8-22 mins  Kari Baars, OT Acute Rehabilitation Services [phone] Office- [phone]     Jashon Ishida, Edwena Felty D 11/10/2019, 2:26 PM

## 2019-11-11 DIAGNOSIS — D62 Acute posthemorrhagic anemia: Secondary | ICD-10-CM | POA: Diagnosis not present

## 2019-11-11 LAB — RENAL FUNCTION PANEL
Albumin: 2 g/dL — ABNORMAL LOW (ref 3.5–5.0)
Anion gap: 9 (ref 5–15)
BUN: 70 mg/dL — ABNORMAL HIGH (ref 8–23)
CO2: 21 mmol/L — ABNORMAL LOW (ref 22–32)
Calcium: 7.9 mg/dL — ABNORMAL LOW (ref 8.9–10.3)
Chloride: 113 mmol/L — ABNORMAL HIGH (ref 98–111)
Creatinine, Ser: 5.99 mg/dL — ABNORMAL HIGH (ref 0.44–1.00)
GFR calc Af Amer: 8 mL/min — ABNORMAL LOW (ref >60)
GFR calc non Af Amer: 7 mL/min — ABNORMAL LOW (ref >60)
Glucose, Bld: 129 mg/dL — ABNORMAL HIGH (ref 70–99)
Phosphorus: 6.6 mg/dL — ABNORMAL HIGH (ref 2.5–4.6)
Potassium: 4.6 mmol/L (ref 3.5–5.1)
Sodium: 143 mmol/L (ref 135–145)

## 2019-11-11 LAB — GLUCOSE, CAPILLARY
Glucose-Capillary: 102 mg/dL — ABNORMAL HIGH (ref 70–99)
Glucose-Capillary: 173 mg/dL — ABNORMAL HIGH (ref 70–99)
Glucose-Capillary: 128 mg/dL — ABNORMAL HIGH (ref 70–99)

## 2019-11-11 LAB — PREPARE RBC (CROSSMATCH)

## 2019-11-11 LAB — CBC
HCT: 18.7 % — ABNORMAL LOW (ref 36.0–46.0)
Hemoglobin: 5.6 g/dL — CL (ref 12.0–15.0)
MCH: 25.3 pg — ABNORMAL LOW (ref 26.0–34.0)
MCHC: 29.9 g/dL — ABNORMAL LOW (ref 30.0–36.0)
MCV: 84.6 fL (ref 80.0–100.0)
Platelets: 453 10*3/uL — ABNORMAL HIGH (ref 150–400)
RBC: 2.21 MIL/uL — ABNORMAL LOW (ref 3.87–5.11)
RDW: 16.9 % — ABNORMAL HIGH (ref 11.5–15.5)
WBC: 8.8 10*3/uL (ref 4.0–10.5)
nRBC: 2.7 % — ABNORMAL HIGH (ref 0.0–0.2)

## 2019-11-11 LAB — HEMOGLOBIN AND HEMATOCRIT, BLOOD
HCT: 19.4 % — ABNORMAL LOW (ref 36.0–46.0)
HCT: 23.2 % — ABNORMAL LOW (ref 36.0–46.0)
HCT: 33.8 % — ABNORMAL LOW (ref 36.0–46.0)
Hemoglobin: 5.9 g/dL — CL (ref 12.0–15.0)
Hemoglobin: 7.5 g/dL — ABNORMAL LOW (ref 12.0–15.0)
Hemoglobin: 11 g/dL — ABNORMAL LOW (ref 12.0–15.0)

## 2019-11-11 MED ORDER — HALOPERIDOL LACTATE 5 MG/ML IJ SOLN
2.0000 mg | Freq: Four times a day (QID) | INTRAMUSCULAR | Status: DC | PRN
  Administered 2019-11-11: 2 mg via INTRAVENOUS
  Filled 2019-11-11: qty 1

## 2019-11-11 MED ORDER — SODIUM CHLORIDE 0.9 % IV SOLN: INTRAVENOUS | Status: DC | PRN

## 2019-11-11 MED ORDER — SODIUM CHLORIDE 0.9% IV SOLUTION: Freq: Once | INTRAVENOUS | Status: AC

## 2019-11-11 MED ORDER — LORAZEPAM 0.5 MG PO TABS
0.5000 mg | ORAL_TABLET | Freq: Four times a day (QID) | ORAL | Status: DC | PRN
  Administered 2019-11-11: 0.5 mg via ORAL
  Filled 2019-11-11: qty 1

## 2019-11-11 MED ORDER — SODIUM CHLORIDE 0.9 % IV BOLUS
2000.0000 mL | Freq: Once | INTRAVENOUS | Status: AC
  Administered 2019-11-11: 2000 mL via INTRAVENOUS

## 2019-11-11 MED ORDER — HYDROXYZINE HCL 50 MG PO TABS
50.0000 mg | ORAL_TABLET | Freq: Three times a day (TID) | ORAL | Status: DC | PRN
  Administered 2019-11-11 – 2019-11-16 (×3): 50 mg via ORAL
  Filled 2019-11-11 (×3): qty 2

## 2019-11-11 MED ORDER — ORAL CARE MOUTH RINSE
15.0000 mL | Freq: Two times a day (BID) | OROMUCOSAL | Status: DC
  Administered 2019-11-12 – 2019-11-16 (×5): 15 mL via OROMUCOSAL

## 2019-11-11 MED ORDER — CHLORHEXIDINE GLUCONATE 0.12 % MT SOLN
15.0000 mL | Freq: Two times a day (BID) | OROMUCOSAL | Status: DC
  Administered 2019-11-11 – 2019-11-22 (×16): 15 mL via OROMUCOSAL
  Filled 2019-11-11 (×21): qty 15

## 2019-11-11 NOTE — Progress Notes (Signed)
CRITICAL VALUE ALERT  Critical Value:  Hgb 5.9  Date & Time Notied:  11/11/19 @0126   Provider Notified: NP Blount  Orders Received/Actions taken: Awaiting response

## 2019-11-11 NOTE — Progress Notes (Signed)
Patient had a large amount of bloody stool at the beginning of the shift with noted large clots.Her Hgb was 5.9 last night and she is currently receiving one of two units of PRBC. Her BP was in the the 80's prior to starting blood but has risen since transfusion started. She is no acute distress and has voiced no complaints at this time. Heparin on hold.

## 2019-11-11 NOTE — Plan of Care (Signed)
  Problem: Health Behavior/Discharge Planning: Goal: Ability to manage health-related needs will improve Outcome: Progressing   Problem: Clinical Measurements: Goal: Ability to maintain clinical measurements within normal limits will improve Outcome: Progressing Goal: Will remain free from infection Outcome: Progressing Goal: Diagnostic test results will improve Outcome: Progressing   Problem: Activity: Goal: Risk for activity intolerance will decrease Outcome: Progressing   Problem: Nutrition: Goal: Adequate nutrition will be maintained Outcome: Progressing   Problem: Coping: Goal: Level of anxiety will decrease Outcome: Progressing   Problem: Elimination: Goal: Will not experience complications related to bowel motility Outcome: Progressing   Problem: Pain Managment: Goal: General experience of comfort will improve Outcome: Progressing   Problem: Safety: Goal: Ability to remain free from injury will improve Outcome: Progressing   Problem: Skin Integrity: Goal: Risk for impaired skin integrity will decrease Outcome: Progressing   

## 2019-11-11 NOTE — Progress Notes (Signed)
Patient incontinent of a large maroon bowel movement with clots. Patient cleansed and repositioned. MD Ghimire notified. VSS. Patient alert, awake and talking.

## 2019-11-11 NOTE — Progress Notes (Signed)
CNA reported pt vitals to RN of 90's/50's at 1400. Patient began yelling out in the hall. RN at bedside to assess patient needs. Pt reports "somethings not right." RN obtained vitals. Pt's BP was 60's/40's. MD paged to bedside. Repeat BP was 70's/40's. RN placed wet wash cloth on pt's forehead. Pt began to yell that her right foot was "burning" and asked RN to rub it.   1412-MD at bedside and ordered 2L fluid bolus of Normal saline and 2 more units of PRBC's to be infused stat.  1415-RN started fluid bolus  1420-RN called blood bank for blood  1440-pt's BP 70's/50's.   1445-charge RN, MaryBeth, at bedside to call rapid response.   1450-rapid team arrived to bedside.  1510-RN called report to Jeanette Caprice, [place]- pt moved to room 1233.  1525- RN called pt's son Lanny Hurst, who did not answer and his voicemail box was full. RN called Pt's sister, Mervyn Skeeters, with her permission and updated Essie on pt's condition and move of rooms. All questions addressed.  1532- Pt's son Lanny Hurst called this RN back and this RN updated Lanny Hurst on pt's current status and new room number. All questions addressed.

## 2019-11-11 NOTE — Progress Notes (Signed)
Rapid Response Event Note   Reason for Call : hypotension   Initial Focused Assessment: when arrived in room MD, patient's RN and charge RN at bedside. Patient A&Ox4, c/o pain "12/10" abdominal pain "all over" and burning in right lower foot. SBP 80's and HR 80. Patient on room air  with oxygen saturations 100%. During Rapid Response call, GI MD arrived at bedside to evaluate patient. Patient had a large bloody bowel movement.    Interventions: MD ordered 2 L bolus, 2 U PRBC and transfer to [facility] ICU/SD.    Plan of Care: transfer to room 1233.   Jaynie Bream, RN

## 2019-11-11 NOTE — Progress Notes (Signed)
PROGRESS NOTE    [name] [name]  VVO:[id number] DOB: [birth year] DOA: 11/05/2019 PCP: [name], MD    Brief Narrative:  64 year old female with morbid obesity, hypertension, type 2 diabetes, stage IV chronic kidney disease with baseline creatinine about 1.7, recent left leg injury and fracture recovering at the skilled nursing facility brought to the ER on 9/14 for confusion.  Patient was reportedly unresponsive to verbal stimuli and subsequently was incoherent.  In the emergency room, she improved spontaneously and responded to questions appropriately.  Patient is on multiple medications including narcotics and benzodiazepine.  Admitted with mild acute metabolic encephalopathy and acute renal failure. Patient continued to have diarrhea 9/19 evening: Patient started having blood mixed with stool and then frank bleeding per rectum with drop in hemoglobin.   Assessment & Plan:   Principal Problem:   Acute renal failure (HCC) Active Problems:   Type 2 diabetes mellitus with stage 4 chronic kidney disease (HCC)   Metabolic encephalopathy   CKD (chronic kidney disease) stage 4, GFR 15-29 ml/min (HCC)   Pressure injury of skin   Morbid obesity (HCC)   GI bleeding   Encephalopathy acute   Vaginal discharge   Anemia associated with acute blood loss  Acute renal failure: Thought to be prerenal with underlying history of stage IV chronic kidney disease due to diabetes. Significant abnormal renal function.  Attributed to multifactorial from use of multiple medications, chlorthalidone and low blood pressure.  In the nursing home she was on multiple CNS medication including Xanax, Cymbalta, Neurontin, hydroxyzine and Percocet. Followed by nephrology Lasix challenge, 9/17 .  Urine output improving.  800 mL urine over last 24 hours. Mentation has improved.  Hopefully her kidneys will recover.  Acute on chronic anemia, chronic iron deficiency anemia.  Anemia of lower GI bleeding.    Baseline hemoglobin about 11-12.  Ferritin 5.  Given fairly him 1 unit. 9/19, started having lower GI bleeding with hemoglobin less than 6. 2 units PRBC overnight, will recheck in ensure stabilization.  Discontinue heparin.  Bright red blood per rectum: Preceded by diarrhea.  Probably lower GI bleed, colitis.  Unable to have contrasted studies.  Blood transfusions to keep hemoglobin more than 8. Clear liquid diet. Discussed with gastroenterology for consultation, may benefit with limited sigmoidoscopy versus colonoscopy.  Acute metabolic encephalopathy: Suspect polypharmacy.  Patient on Xanax, Cymbalta, Neurontin, hydroxyzine and Percocet.  No focal neurological deficit.  Mental status improved.  Discontinued all sedating medications.  Started on low-dose Percocet.  We will continue.  Type 2 diabetes: With hypoglycemic episodes.  On very sensitive sliding scale and regular diet.  Stabilizing.  Morbid obesity: BMI 74.  High risk of mortality and morbidity.  UTI: Present on admission.  Sepsis ruled out.  Treated with 3 days of Rocephin.  DVT prophylaxis: Heparin, discontinue.  Start on SCDs.   Code Status: Full code Family Communication: Sister Ms [name] [name] on the phone 9/18 Disposition Plan: Status is: Inpatient  Remains inpatient appropriate because:IV treatments appropriate due to intensity of illness or inability to take PO and Inpatient level of care appropriate due to severity of illness   Dispo: The patient is from: SNF              Anticipated d/c is to: SNF              Anticipated d/c date is: 2-3 days. start working with PT OT.  Discharge back to skilled nursing home when renal function stabilizes.  Patient currently is not medically stable to d/c.  Now with bleeding.   Consultants:   Nephrology  Gastroenterology  Procedures:   None  Antimicrobials:   Rocephin, 9/14--- 9/18.   Subjective: Seen and examined.  Overnight events noted.  Had 2 large  rectal bleed.  Overnight hemoglobin 5.6.  Currently receiving second units of PRBC. Complains of some nausea.  No other complaints.  Feels weak.  No abdominal pain or vomiting.  Objective: Vitals:   11/11/19 0415 11/11/19 0620 11/11/19 0704 11/11/19 0926  BP: (!) 100/48 (!) 124/51 (!) 120/58 (!) 122/56  Pulse: 85  80 81  Resp:      Temp: 98.4 F (36.9 C) 98.1 F (36.7 C) 98.6 F (37 C) 98.3 F (36.8 C)  TempSrc: Oral Oral Oral Oral  SpO2: 100% 92% 100% 100%  Weight:      Height:        Intake/Output Summary (Last 24 hours) at 11/11/2019 1050 Last data filed at 11/11/2019 6333 Gross per 24 hour  Intake 1506 ml  Output 850 ml  Net 656 ml   Filed Weights   11/06/19 1830  Weight: (!) 184.6 kg    Examination:  General exam: Appears calm and comfortable  Morbidly obese and chronically sick looking lady on room air.  Communicating well.  Not in any distress.  Anxious today. Respiratory system: Clear to auscultation. Respiratory effort normal. Cardiovascular system: S1 & S2 heard, RRR. No JVD, murmurs, rubs, gallops or clicks. No pedal edema. Gastrointestinal system: Abdomen is nondistended, soft and nontender. No organomegaly or masses felt. Normal bowel sounds heard.  Obese and pendulous. Foley catheter with clear urine. Central nervous system: Alert and oriented. No focal neurological deficits. Extremities: Symmetric 5 x 5 power. Skin: No rashes, lesions or ulcers Psychiatry: Judgement and insight appear normal. Mood & affect appropriate.  On perineal inspection, no obvious bleeding or ulcers.   Data Reviewed: I have personally reviewed following labs and imaging studies  CBC: Recent Labs  Lab 11/07/19 0459 11/07/19 0459 11/08/19 0310 11/09/19 0831 11/10/19 0410 11/11/19 0041 11/11/19 0220  WBC 6.1  --  7.1 6.7 8.1  --  8.8  HGB 8.9*   < > 8.4* 8.3* 8.1* 5.9* 5.6*  HCT 29.6*   < > 28.1* 28.1* 27.4* 19.4* 18.7*  MCV 84.1  --  84.4 84.4 84.3  --  84.6  PLT 651*   --  665* 608* 612*  --  453*   < > = values in this interval not displayed.   Basic Metabolic Panel: Recent Labs  Lab 11/07/19 0459 11/08/19 0310 11/09/19 0831 11/10/19 0410 11/11/19 0220  NA 138 139 140 143 143  K 5.1 4.9 4.6 4.4 4.6  CL 103 105 106 108 113*  CO2 23 23 22 22  21*  GLUCOSE 85 84 82 105* 129*  BUN 65* 62* 61* 61* 70*  CREATININE 6.92* 7.15* 6.86* 6.64* 5.99*  CALCIUM 8.8* 8.8* 9.4 8.9 7.9*  PHOS  --   --  8.4* 7.8* 6.6*   GFR: Estimated Creatinine Clearance: 15.2 mL/min (A) (by C-G formula based on SCr of 5.99 mg/dL (H)). Liver Function Tests: Recent Labs  Lab 11/05/19 2216 11/05/19 2216 11/06/19 1751 11/06/19 1751 11/07/19 0459 11/08/19 0310 11/09/19 0831 11/10/19 0410 11/11/19 0220  AST 17  --  14*  --  11* 12*  --   --   --   ALT 17  --  15  --  15 15  --   --   --  ALKPHOS 104  --  94  --  97 89  --   --   --   BILITOT 0.4  --  0.4  --  0.6 0.5  --   --   --   PROT 6.5  --  6.0*  --  6.3* 6.1*  --   --   --   ALBUMIN 3.1*   < > 2.7*   < > 2.8* 2.7* 2.6* 2.5* 2.0*   < > = values in this interval not displayed.   No results for input(s): LIPASE, AMYLASE in the last 168 hours. Recent Labs  Lab 11/05/19 2216  AMMONIA 36*   Coagulation Profile: Recent Labs  Lab 11/06/19 0613  INR 1.0   Cardiac Enzymes: No results for input(s): CKTOTAL, CKMB, CKMBINDEX, TROPONINI in the last 168 hours. BNP (last 3 results) No results for input(s): PROBNP in the last 8760 hours. HbA1C: No results for input(s): HGBA1C in the last 72 hours. CBG: Recent Labs  Lab 11/09/19 2307 11/10/19 0737 11/10/19 1206 11/10/19 1706 11/10/19 2241  GLUCAP 102* 79 96 90 117*   Lipid Profile: No results for input(s): CHOL, HDL, LDLCALC, TRIG, CHOLHDL, LDLDIRECT in the last 72 hours. Thyroid Function Tests: No results for input(s): TSH, T4TOTAL, FREET4, T3FREE, THYROIDAB in the last 72 hours. Anemia Panel: Recent Labs    11/08/19 1430  FERRITIN 9*  TIBC 415   IRON 31   Sepsis Labs: Recent Labs  Lab 11/06/19 6203  LATICACIDVEN 0.8    Recent Results (from the past 240 hour(s))  SARS Coronavirus 2 by RT PCR (hospital order, performed in [facility] hospital lab) Nasopharyngeal Nasopharyngeal Swab     Status: None   Collection Time: 11/05/19  9:48 PM   Specimen: Nasopharyngeal Swab  Result Value Ref Range Status   SARS Coronavirus 2 NEGATIVE NEGATIVE Final    Comment: (NOTE) SARS-CoV-2 target nucleic acids are NOT DETECTED.  The SARS-CoV-2 RNA is generally detectable in upper and lower respiratory specimens during the acute phase of infection. The lowest concentration of SARS-CoV-2 viral copies this assay can detect is 250 copies / mL. A negative result does not preclude SARS-CoV-2 infection and should not be used as the sole basis for treatment or other patient management decisions.  A negative result may occur with improper specimen collection / handling, submission of specimen other than nasopharyngeal swab, presence of viral mutation(s) within the areas targeted by this assay, and inadequate number of viral copies (<250 copies / mL). A negative result must be combined with clinical observations, patient history, and epidemiological information.  Fact Sheet for Patients:   StrictlyIdeas.no  Fact Sheet for Healthcare Providers: BankingDealers.co.za  This test is not yet approved or  cleared by the [place] FDA and has been authorized for detection and/or diagnosis of SARS-CoV-2 by FDA under an Emergency Use Authorization (EUA).  This EUA will remain in effect (meaning this test can be used) for the duration of the COVID-19 declaration under Section 564(b)(1) of the Act, 21 U.S.C. section 360bbb-3(b)(1), unless the authorization is terminated or revoked sooner.  Performed at [facility], [place] [address] [place], [place] [address]   Culture, Urine      Status: Abnormal   Collection Time: 11/08/19  1:49 PM   Specimen: Urine, Catheterized  Result Value Ref Range Status   Specimen Description   Final    URINE, CATHETERIZED Performed at [facility] [address], [place], [place] [address]  Special Requests   Final    NONE Performed at [facility], [place] [address], [place], [place] [address]    Culture MULTIPLE SPECIES PRESENT, SUGGEST RECOLLECTION (A)  Final   Report Status 11/09/2019 FINAL  Final  Culture, Urine     Status: Abnormal   Collection Time: 11/09/19 12:04 PM   Specimen: Urine, Random  Result Value Ref Range Status   Specimen Description   Final    URINE, RANDOM Performed at [facility] [address], [place], [place] [address]    Special Requests   Final    NONE Performed at [facility], [place] [address], [place], [place] [address]    Culture (A)  Final    <10,000 COLONIES/mL INSIGNIFICANT GROWTH Performed at [facility] [address] [place], [place] [address]    Report Status 11/10/2019 FINAL  Final         Radiology Studies: No results found.      Scheduled Meds: . sodium chloride   Intravenous Once  . atorvastatin  10 mg Oral Daily  . Chlorhexidine Gluconate Cloth  6 each Topical Daily  . feeding supplement (GLUCERNA SHAKE)  237 mL Oral BID BM  . insulin aspart  0-6 Units Subcutaneous TID WC  . senna  1 tablet Oral Daily   Continuous Infusions: . sodium chloride 100 mL/hr at 11/11/19 0319  . ferumoxytol 510 mg (11/08/19 1615)  . sodium chloride       LOS: 5 days    Time spent: 30 minutes    Barb Merino, MD [facility] Pager [phone]

## 2019-11-11 NOTE — Progress Notes (Addendum)
A Rapid response was called with patient having 2 large bright red blood per rectum, blood pressure was 60 systolic.  Attended patient at the bedside with nursing staff and rapid response team.  Patient has a tunneled central catheter, she was given 2 L IV fluids boluses and urgent request for blood transfusion, 2 units PRBC transfusion is in the process.  Patient responded appropriately to volume expansion. Transfer to stepdown unit for close monitoring. H&H in 2 hours after last transfusion, transfuse 2 units PRBC if hemoglobin less than 8. Requested respite therapist to get an A-line because of inaccurate readings.  Blood pressures are adequate now. Gastroenterologist Dr. Collene Mares was called to bedside and we examined patient together multiple times. She had active squirting of blood through her rectum on our evaluation.  Plan: Likely lower GI bleeding active in a patient with morbid obesity and acute renal failure. Supportive treatment, transfusions to keep hemoglobin more than 8. CT angiogram or contrast studies will be deferred, she will end up on hemodialysis possibly. If she slows down, will benefit with colonoscopy.  I have updated patient's sister and son over the phone and encouraged him to visit patient in the hospital. We have also discussed about poor prognosis if there is ongoing bleeding.  613 PM:  will order 2 more units of PRBC as she had another big bloody bowel movement. This will be total 6 units last 24 hours.
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
## 2019-11-11 NOTE — Progress Notes (Signed)
Received patient from 1427 into 1233 for hypotension and bloody bowel movements transported by bed with floor staff and ICU staff. Upon arrival to the unit, the patient is alert, oriented x4, and speech clear. Answers questions appropriately.

## 2019-11-11 NOTE — Progress Notes (Signed)
Nephrology Follow-Up Consult note   Assessment/Recommendations: [name] [name] is a/an 63 y.o. female with a past medical history significant for DM 2, morbid obesity, HTN, neuropathy, CKD, admitted for altered mental status.    Problems: Non-Oliguric AKI on CKD: b/l creat 1.5- 1.8, eGFR 33- 38 ml/min. Likely ATN w/ effects of diuretics, hypotension, UTI and poor po intake all contributing. No signs of obstruction on CT scan.  UA with pyuria but also numerous bacteria.  Good UOP 700 cc yest and 700 cc so far today. Admit creat 6.2 on 9/14, peak at 7.15 on 9/17 and down to 6.6 yest and 5.9 today. BUN 70.   Hopefully GI bleed won't affect renal recovery process. Will follow.   GI bleed - acute , this afternoon.  Per pmd/ GI.   Pyuria/UTI: Presumptive UTI but urine culture with no growth.  Numerous bacteria on urinalysis make UTI most likely.  Possible AIN but no obvious medication implicated.  Repeat urine culture with many bacteria recommended repeating again.   Morbid obesity: Likely has contributed to CKD over time  Diabetes mellitus type 2: Management per primary.  Anemia: Hemoglobin 8.3.  Iron sat 7 and ferritin 9.  Status post Feraheme 1 dose repeat dose in 1 week.    Diarrhea: Being treated with Imodium by primary team.  Continue work-up per them.   [facility] 11/11/2019 2:56 PM  _________________________________________________________ Interval History/Subjective: good UOP 750 cc yest and 700 cc so far today. SBP's 90's- 120's, HR 80 and RR 18, afebrile.  Unfortunately had abd pain w/ rapid response and pt had large bloody BM w/ BP drop and was moved to ICU bed.   Total I/O 11 L in and 3 L out = +7- 8 L  Constitutional: Morbidly obese, no distress, lying flat, in no distress ENMT: ears and nose without scars or lesions, MMM CV: normal rate, 2+ diffuse bilat leg edema Respiratory: Lying flat, bilateral chest rise, normal work of  breathing Gastrointestinal: soft, non-tender, no palpable masses or hernias Skin: no visible lesions or rashes Psych: alert, judgement/insight appropriate, appropriate mood and affect Neuro: no asterixis present  Medications:  Current Facility-Administered Medications  Medication Dose Route Frequency Provider Last Rate Last Admin  . 0.9 %  sodium chloride infusion (Manually program via Guardrails IV Fluids)   Intravenous Once Lovey Newcomer T, NP   Stopping Infusion hung by another clincian at 11/11/19 0925  . 0.9 %  sodium chloride infusion (Manually program via Guardrails IV Fluids)   Intravenous Once Barb Merino, MD      . 0.9 %  sodium chloride infusion   Intravenous Continuous Reesa Chew, MD 100 mL/hr at 11/11/19 0319 New Bag at 11/11/19 0319  . acetaminophen (TYLENOL) tablet 650 mg  650 mg Oral Q6H PRN Mujtaba, Mohammadtokir, MD   650 mg at 11/07/19 1343   Or  . acetaminophen (TYLENOL) suppository 650 mg  650 mg Rectal Q6H PRN Mujtaba, Mohammadtokir, MD      . albuterol (PROVENTIL) (2.5 MG/3ML) 0.083% nebulizer solution 2.5 mg  2.5 mg Nebulization Q2H PRN Mujtaba, Mohammadtokir, MD      . atorvastatin (LIPITOR) tablet 10 mg  10 mg Oral Daily Mujtaba, Mohammadtokir, MD   10 mg at 11/11/19 0952  . Chlorhexidine Gluconate Cloth 2 % PADS 6 each  6 each Topical Daily Annita Brod, MD   6 each at 11/11/19 1212  . feeding supplement (GLUCERNA SHAKE) (GLUCERNA SHAKE) liquid 237 mL  237  mL Oral BID BM Barb Merino, MD   237 mL at 11/11/19 1347  . ferumoxytol (FERAHEME) 510 mg in sodium chloride 0.9 % 100 mL IVPB  510 mg Intravenous Weekly Reesa Chew, MD 468 mL/hr at 11/08/19 1615 510 mg at 11/08/19 1615  . HYDROcodone-acetaminophen (NORCO/VICODIN) 5-325 MG per tablet 1 tablet  1 tablet Oral Q8H PRN Annita Brod, MD   1 tablet at 11/10/19 2836  . insulin aspart (novoLOG) injection 0-6 Units  0-6 Units Subcutaneous TID WC Ghimire, Dante Gang, MD      . lip balm (CARMEX)  ointment   Topical PRN Barb Merino, MD      . loperamide (IMODIUM) capsule 2 mg  2 mg Oral Q6H PRN Barb Merino, MD   2 mg at 11/10/19 1325  . ondansetron (ZOFRAN) injection 4 mg  4 mg Intravenous Q6H PRN Annita Brod, MD   4 mg at 11/10/19 1621  . senna (SENOKOT) tablet 8.6 mg  1 tablet Oral Daily Mujtaba, Mohammadtokir, MD   8.6 mg at 11/08/19 1003  . sodium chloride 0.9 % bolus 2,000 mL  2,000 mL Intravenous Once Barb Merino, MD      . sodium chloride 0.9 % bolus 500 mL  500 mL Intravenous Once Mujtaba, Mohammadtokir, MD      . sodium chloride flush (NS) 0.9 % injection 10-40 mL  10-40 mL Intracatheter PRN Annita Brod, MD          Review of Systems: 10 systems reviewed and negative except per interval history/subjective  Physical Exam: Vitals:   11/11/19 0926 11/11/19 1400  BP: (!) 122/56 (!) 92/45  Pulse: 81 85  Resp:  18  Temp: 98.3 F (36.8 C) 98 F (36.7 C)  SpO2: 100% 100%   Total I/O In: 1480 [P.O.:480; I.V.:500; Blood:500] Out: 250 [Urine:250]  Intake/Output Summary (Last 24 hours) at 11/11/2019 1456 Last data filed at 11/11/2019 1403 Gross per 24 hour  Intake 2626 ml  Output 950 ml  Net 1676 ml      Test Results I personally reviewed new and old clinical labs and radiology tests Lab Results  Component Value Date   NA 143 11/11/2019   K 4.6 11/11/2019   CL 113 (H) 11/11/2019   CO2 21 (L) 11/11/2019   BUN 70 (H) 11/11/2019   CREATININE 5.99 (H) 11/11/2019   CALCIUM 7.9 (L) 11/11/2019   ALBUMIN 2.0 (L) 11/11/2019   PHOS 6.6 (H) 11/11/2019

## 2019-11-11 NOTE — Procedures (Signed)
Arterial Catheter Insertion Procedure Note  MAGALI BRAY  [id number]  1955-03-24  Date:11/11/19  Time:4:34 PM    Provider Performing: Otelia Sergeant    Procedure: Insertion of Arterial Line [phone]) with US guidance ([id number])   Indication(s) Blood pressure monitoring and/or need for frequent ABGs  Consent Risks of the procedure as well as the alternatives and risks of each were explained to the patient and/or caregiver.  Consent for the procedure was obtained and is signed in the bedside chart  Anesthesia None   Time Out Verified patient identification, verified procedure, site/side was marked, verified correct patient position, special equipment/implants available, medications/allergies/relevant history reviewed, required imaging and test results available.   Sterile Technique Maximal sterile technique including full sterile barrier drape, hand hygiene, sterile gown, sterile gloves, mask, hair covering, sterile ultrasound probe cover (if used).   Procedure Description Area of catheter insertion was cleaned with chlorhexidine and draped in sterile fashion. Without real-time ultrasound guidance an arterial catheter was placed into the right radial artery.  Appropriate arterial tracings confirmed on monitor.     Complications/Tolerance None; patient tolerated the procedure well.   EBL Minimal    Specimen(s) None

## 2019-11-12 DIAGNOSIS — D62 Acute posthemorrhagic anemia: Secondary | ICD-10-CM

## 2019-11-12 DIAGNOSIS — K922 Gastrointestinal hemorrhage, unspecified: Secondary | ICD-10-CM

## 2019-11-12 LAB — HEMOGLOBIN AND HEMATOCRIT, BLOOD
HCT: 34.8 % — ABNORMAL LOW (ref 36.0–46.0)
HCT: 30.6 % — ABNORMAL LOW (ref 36.0–46.0)
HCT: 27.5 % — ABNORMAL LOW (ref 36.0–46.0)
Hemoglobin: 11.4 g/dL — ABNORMAL LOW (ref 12.0–15.0)
Hemoglobin: 10.4 g/dL — ABNORMAL LOW (ref 12.0–15.0)
Hemoglobin: 9.3 g/dL — ABNORMAL LOW (ref 12.0–15.0)

## 2019-11-12 LAB — RENAL FUNCTION PANEL
Albumin: 2.1 g/dL — ABNORMAL LOW (ref 3.5–5.0)
Anion gap: 12 (ref 5–15)
BUN: 82 mg/dL — ABNORMAL HIGH (ref 8–23)
CO2: 19 mmol/L — ABNORMAL LOW (ref 22–32)
Calcium: 8.4 mg/dL — ABNORMAL LOW (ref 8.9–10.3)
Chloride: 116 mmol/L — ABNORMAL HIGH (ref 98–111)
Creatinine, Ser: 5.27 mg/dL — ABNORMAL HIGH (ref 0.44–1.00)
GFR calc Af Amer: 9 mL/min — ABNORMAL LOW (ref >60)
GFR calc non Af Amer: 8 mL/min — ABNORMAL LOW (ref >60)
Glucose, Bld: 119 mg/dL — ABNORMAL HIGH (ref 70–99)
Phosphorus: 5.6 mg/dL — ABNORMAL HIGH (ref 2.5–4.6)
Potassium: 4.3 mmol/L (ref 3.5–5.1)
Sodium: 147 mmol/L — ABNORMAL HIGH (ref 135–145)

## 2019-11-12 LAB — GLUCOSE, CAPILLARY
Glucose-Capillary: 106 mg/dL — ABNORMAL HIGH (ref 70–99)
Glucose-Capillary: 116 mg/dL — ABNORMAL HIGH (ref 70–99)
Glucose-Capillary: 100 mg/dL — ABNORMAL HIGH (ref 70–99)
Glucose-Capillary: 115 mg/dL — ABNORMAL HIGH (ref 70–99)
Glucose-Capillary: 117 mg/dL — ABNORMAL HIGH (ref 70–99)
Glucose-Capillary: 113 mg/dL — ABNORMAL HIGH (ref 70–99)

## 2019-11-12 MED ORDER — PANTOPRAZOLE SODIUM 40 MG IV SOLR
40.0000 mg | Freq: Two times a day (BID) | INTRAVENOUS | Status: DC
  Administered 2019-11-12 – 2019-11-21 (×19): 40 mg via INTRAVENOUS
  Filled 2019-11-12 (×20): qty 40

## 2019-11-12 MED ORDER — PEG 3350-KCL-NA BICARB-NACL 420 G PO SOLR
4000.0000 mL | Freq: Once | ORAL | Status: AC
  Administered 2019-11-12: 4000 mL via ORAL
  Filled 2019-11-12: qty 4000

## 2019-11-12 MED ORDER — DEXTROSE 5 % IV SOLN: INTRAVENOUS | Status: AC

## 2019-11-12 MED ORDER — SODIUM CHLORIDE 0.9 % IV SOLN: INTRAVENOUS | Status: DC

## 2019-11-12 NOTE — Plan of Care (Signed)
  Problem: Health Behavior/Discharge Planning: Goal: Ability to manage health-related needs will improve Outcome: Progressing   Problem: Clinical Measurements: Goal: Ability to maintain clinical measurements within normal limits will improve Outcome: Progressing Goal: Will remain free from infection Outcome: Progressing Goal: Diagnostic test results will improve Outcome: Progressing   Problem: Activity: Goal: Risk for activity intolerance will decrease Outcome: Progressing   Problem: Nutrition: Goal: Adequate nutrition will be maintained Outcome: Progressing   Problem: Coping: Goal: Level of anxiety will decrease Outcome: Progressing   Problem: Elimination: Goal: Will not experience complications related to bowel motility Outcome: Progressing   Problem: Pain Managment: Goal: General experience of comfort will improve Outcome: Progressing   Problem: Safety: Goal: Ability to remain free from injury will improve Outcome: Progressing   Problem: Skin Integrity: Goal: Risk for impaired skin integrity will decrease Outcome: Progressing   

## 2019-11-12 NOTE — Progress Notes (Signed)
Nephrology Follow-Up Consult note   Summary: [name] [name] is a/an 64 y.o. female with a past medical history significant for DM 2, morbid obesity, HTN, neuropathy, CKD, admitted for altered mental status.    Problems: Non-Oliguric AKI on CKD: b/l creat 1.5- 1.8, eGFR 33- 38 ml/min. Likely ATN w/ effects of diuretics, hypotension, UTI and poor po intake all contributing. No signs of obstruction on CT scan.  UA with pyuria but also numerous bacteria.  Good UOP 700 cc yest and 700 cc so far today. Admit creat 6.2 on 9/14, peak at 7.15 on 9/17 >> 6.4 > .59 > 5.27 today. Good UOP. Will cont to follow.   Volume - sig leg edema, wt's are sig up. Will consider lasix soon when creat is down more.   GI bleed - acute, low BP's have resolved. Per pmd/ GI.   Pyuria/UTI: Presumptive UTI but urine culture with no growth.  Numerous bacteria on urinalysis make UTI most likely.  Possible AIN but no obvious medication implicated.  Repeat urine culture with many bacteria recommended repeating again.   Morbid obesity: Likely has contributed to CKD over time  Diabetes mellitus type 2: Management per primary.  Anemia: Hemoglobin 8.3.  Iron sat 7 and ferritin 9.  Status post Feraheme 1 dose repeat dose in 1 week.    Diarrhea: Being treated with Imodium by primary team.  Continue work-up per them.   [facility] 11/12/2019 3:35 PM  _________________________________________________________ Interval History/Subjective: Doing good today.  Creat down again to 5.2.   Constitutional: Morbidly obese, no distress, lying flat, in no distress ENMT: ears and nose without scars or lesions, MMM CV: normal rate, 2+ diffuse bilat leg edema Respiratory: Lying flat, bilateral chest rise, normal work of breathing Gastrointestinal: soft, non-tender, no palpable masses or hernias Skin: no visible lesions or rashes Psych: alert, judgement/insight appropriate, appropriate mood and  affect Neuro: no asterixis present  Medications:  Current Facility-Administered Medications  Medication Dose Route Frequency Provider Last Rate Last Admin  . 0.9 %  sodium chloride infusion   Intra-arterial PRN Barb Merino, MD      . acetaminophen (TYLENOL) tablet 650 mg  650 mg Oral Q6H PRN Mujtaba, Mohammadtokir, MD   650 mg at 11/07/19 1343   Or  . acetaminophen (TYLENOL) suppository 650 mg  650 mg Rectal Q6H PRN Mujtaba, Mohammadtokir, MD      . albuterol (PROVENTIL) (2.5 MG/3ML) 0.083% nebulizer solution 2.5 mg  2.5 mg Nebulization Q2H PRN Mujtaba, Mohammadtokir, MD      . atorvastatin (LIPITOR) tablet 10 mg  10 mg Oral Daily Mujtaba, Mohammadtokir, MD   10 mg at 11/12/19 0937  . chlorhexidine (PERIDEX) 0.12 % solution 15 mL  15 mL Mouth Rinse BID Barb Merino, MD   15 mL at 11/12/19 0937  . Chlorhexidine Gluconate Cloth 2 % PADS 6 each  6 each Topical Daily Annita Brod, MD   6 each at 11/12/19 [phone]  . dextrose 5 % solution   Intravenous Continuous Roney Jaffe, MD 100 mL/hr at 11/12/19 0939 New Bag at 11/12/19 0939  . feeding supplement (GLUCERNA SHAKE) (GLUCERNA SHAKE) liquid 237 mL  237 mL Oral BID BM Barb Merino, MD   237 mL at 11/11/19 1347  . ferumoxytol (FERAHEME) 510 mg in sodium chloride 0.9 % 100 mL IVPB  510 mg Intravenous Weekly Reesa Chew, MD 468 mL/hr at 11/08/19 1615 510 mg at 11/08/19 1615  . haloperidol lactate (HALDOL) injection 2  mg  2 mg Intravenous Q6H PRN Lang Snow, FNP   2 mg at 11/11/19 2139  . HYDROcodone-acetaminophen (NORCO/VICODIN) 5-325 MG per tablet 1 tablet  1 tablet Oral Q8H PRN Annita Brod, MD   1 tablet at 11/11/19 2106  . hydrOXYzine (ATARAX/VISTARIL) tablet 50 mg  50 mg Oral TID PRN Barb Merino, MD   50 mg at 11/11/19 2106  . insulin aspart (novoLOG) injection 0-6 Units  0-6 Units Subcutaneous TID WC Ghimire, Dante Gang, MD      . lip balm (CARMEX) ointment   Topical PRN Barb Merino, MD      . loperamide (IMODIUM)  capsule 2 mg  2 mg Oral Q6H PRN Barb Merino, MD   2 mg at 11/10/19 1325  . MEDLINE mouth rinse  15 mL Mouth Rinse q12n4p Barb Merino, MD   15 mL at 11/12/19 1228  . ondansetron (ZOFRAN) injection 4 mg  4 mg Intravenous Q6H PRN Annita Brod, MD   4 mg at 11/10/19 1621  . pantoprazole (PROTONIX) injection 40 mg  40 mg Intravenous Q12H Barb Merino, MD   40 mg at 11/12/19 1228  . sodium chloride 0.9 % bolus 500 mL  500 mL Intravenous Once Mujtaba, Mohammadtokir, MD      . sodium chloride flush (NS) 0.9 % injection 10-40 mL  10-40 mL Intracatheter PRN Annita Brod, MD          Review of Systems: 10 systems reviewed and negative except per interval history/subjective  Physical Exam: Vitals:   11/12/19 1300 11/12/19 1400  BP:    Pulse:  88  Resp: 20 (!) 21  Temp:    SpO2:  100%   Total I/O In: 801 [P.O.:1; I.V.:800] Out: 1 [Stool:1]  Intake/Output Summary (Last 24 hours) at 11/12/2019 1535 Last data filed at 11/12/2019 1400 Gross per 24 hour  Intake 6117.8 ml  Output 681 ml  Net 5436.8 ml      Test Results I personally reviewed new and old clinical labs and radiology tests Lab Results  Component Value Date   NA 147 (H) 11/12/2019   K 4.3 11/12/2019   CL 116 (H) 11/12/2019   CO2 19 (L) 11/12/2019   BUN 82 (H) 11/12/2019   CREATININE 5.27 (H) 11/12/2019   CALCIUM 8.4 (L) 11/12/2019   ALBUMIN 2.1 (L) 11/12/2019   PHOS 5.6 (H) 11/12/2019

## 2019-11-12 NOTE — Progress Notes (Signed)
PROGRESS NOTE    [name] [name]  MHD:[id number] DOB: Jul 04, 1955 DOA: 11/05/2019 PCP: [name], MD    Brief Narrative:  64 year old female with super morbid obesity, hypertension, type 2 diabetes, stage IV chronic kidney disease with baseline creatinine about 1.7, recent left leg injury and fracture recovering at the skilled nursing facility brought to the ER on 9/14 for confusion.  Patient was reportedly unresponsive to verbal stimuli and subsequently was incoherent.  In the emergency room, she improved spontaneously and responded to questions appropriately.  Patient is on multiple medications including narcotics and benzodiazepine.  Admitted with mild acute metabolic encephalopathy and acute renal failure. Patient continued to have diarrhea  9/19 evening: Patient started having blood mixed with stool and then frank bleeding per rectum with drop in hemoglobin. 9/20: Severe lower GI bleed with multiple fresh bleed per rectum, hypotensive and hemoglobin 5.6 with baseline hemoglobin 11. Resuscitation, 6 units of PRBC, multiple isotonic fluids with stabilization.   Assessment & Plan:   Principal Problem:   Acute renal failure (HCC) Active Problems:   Type 2 diabetes mellitus with stage 4 chronic kidney disease (HCC)   Metabolic encephalopathy   CKD (chronic kidney disease) stage 4, GFR 15-29 ml/min (HCC)   Pressure injury of skin   Morbid obesity (HCC)   GI bleeding   Encephalopathy acute   Vaginal discharge   Anemia associated with acute blood loss  Acute renal failure with underlying history of CKD stage IV: Thought to be prerenal with underlying history of stage IV chronic kidney disease due to diabetes. Significant abnormal renal function.  Attributed to multifactorial from use of multiple medications, chlorthalidone and low blood pressure.  In the nursing home she was on multiple CNS medication including Xanax, Cymbalta, Neurontin, hydroxyzine and Percocet. Followed by  nephrology Lasix challenge, 9/17 .  Urine output improving.  700 mL urine over last 24 hours. Mentation has improved.  Hopefully her kidneys will recover.  Acute on chronic anemia, chronic iron deficiency anemia.  Anemia of lower GI bleeding.   Baseline hemoglobin about 11-12.  Ferritin 5.  Given 1 unit of Feraheme. 9/19-9/20 6 units of total PRBC with ongoing lower GI bleeding.  Hemoglobin has stabilized. Continue to check H&H every 8 hours. Anticipating colonoscopy.  GI on board.  Bright red blood per rectum: Preceded by diarrhea.  Probably lower GI bleed, colitis.  Unable to have contrasted studies.  Blood transfusions to keep hemoglobin more than 8. Clear liquid diet. GI on board.  Unable to have angiogram because of worsening renal functions.  Hopefully she can go for colonoscopy tomorrow. Likely source of bleeding, however does have history of gastric polyp?,  Will start on Protonix.  Acute metabolic encephalopathy: Suspect polypharmacy.  Patient on Xanax, Cymbalta, Neurontin, hydroxyzine and Percocet.  No focal neurological deficit.  Mental status improved.  Discontinued all sedating medications.  Started on low-dose Percocet and intermittent Atarax.  Type 2 diabetes: With hypoglycemic episodes.  On very sensitive sliding scale and regular diet.  Stabilizing.  Morbid obesity: BMI 74.  High risk of mortality and morbidity.  UTI: Present on admission.  Sepsis ruled out.  Treated with 3 days of Rocephin.  DVT prophylaxis: SCDs   Code Status: Full code Family Communication: will call  Disposition Plan: Status is: Inpatient  Remains inpatient appropriate because:IV treatments appropriate due to intensity of illness or inability to take PO and Inpatient level of care appropriate due to severity of illness   Dispo: The patient is from:  SNF              Anticipated d/c is to: SNF              Anticipated d/c date is: 2-3 days. start working with PT OT.  Discharge back to skilled  nursing home when renal function stabilizes.              Patient currently is not medically stable to d/c.  Now with bleeding.   Consultants:   Nephrology  Gastroenterology  Procedures:   A-line, 9/20  Tunneled subclavian line  Antimicrobials:   Rocephin, 9/14--- 9/18.   Subjective: Patient seen and examined.  She just feels weak.  Had 1 bloody bowel movement overnight.  Blood pressure stabilized overnight.  Does feel some bloating of the abdomen.   Objective: Vitals:   11/12/19 0700 11/12/19 0800 11/12/19 0900 11/12/19 1000  BP:      Pulse: 84 85  86  Resp: 19 17 18 19   Temp:  98.7 F (37.1 C)    TempSrc:  Axillary    SpO2: 100% 99%  99%  Weight:      Height:        Intake/Output Summary (Last 24 hours) at 11/12/2019 1112 Last data filed at 11/12/2019 1000 Gross per 24 hour  Intake 6576.8 ml  Output 781 ml  Net 5795.8 ml   Filed Weights   11/06/19 1830 11/12/19 0600  Weight: (!) 184.6 kg (!) 208.7 kg    Examination:  General exam: Appears calm and comfortable  Morbidly obese and chronically sick looking lady on room air.  Communicating well.  Not in any distress.  Slightly anxious today. Respiratory system: Clear to auscultation. Respiratory effort normal.  Difficult to auscultate. Cardiovascular system: S1 & S2 heard, RRR. No JVD, murmurs, rubs, gallops or clicks. No pedal edema. Gastrointestinal system: Abdomen is nondistended, soft and nontender. No organomegaly or masses felt. Normal bowel sounds heard.  Obese and pendulous. Foley catheter with clear urine. Central nervous system: Alert and oriented. No focal neurological deficits. Extremities: Symmetric 5 x 5 power. Skin: No rashes, lesions or ulcers Psychiatry: Judgement and insight appear normal. Mood & affect appropriate.  Patient has multiple unstageable pressure ulcers on her both heels.   Data Reviewed: I have personally reviewed following labs and imaging studies  CBC: Recent Labs  Lab  11/07/19 0459 11/07/19 0459 11/08/19 0310 11/08/19 0310 11/09/19 0831 11/09/19 0831 11/10/19 0410 11/10/19 0410 11/11/19 0041 11/11/19 0220 11/11/19 1159 11/11/19 2114 11/12/19 0255  WBC 6.1  --  7.1  --  6.7  --  8.1  --   --  8.8  --   --   --   HGB 8.9*   < > 8.4*   < > 8.3*   < > 8.1*   < > 5.9* 5.6* 7.5* 11.0* 11.4*  HCT 29.6*   < > 28.1*   < > 28.1*   < > 27.4*   < > 19.4* 18.7* 23.2* 33.8* 34.8*  MCV 84.1  --  84.4  --  84.4  --  84.3  --   --  84.6  --   --   --   PLT 651*  --  665*  --  608*  --  612*  --   --  453*  --   --   --    < > = values in this interval not displayed.   Basic Metabolic Panel: Recent Labs  Lab 11/08/19 0310  11/09/19 0831 11/10/19 0410 11/11/19 0220 11/12/19 0255  NA 139 140 143 143 147*  K 4.9 4.6 4.4 4.6 4.3  CL 105 106 108 113* 116*  CO2 23 22 22  21* 19*  GLUCOSE 84 82 105* 129* 119*  BUN 62* 61* 61* 70* 82*  CREATININE 7.15* 6.86* 6.64* 5.99* 5.27*  CALCIUM 8.8* 9.4 8.9 7.9* 8.4*  PHOS  --  8.4* 7.8* 6.6* 5.6*   GFR: Estimated Creatinine Clearance: 18.8 mL/min (A) (by C-G formula based on SCr of 5.27 mg/dL (H)). Liver Function Tests: Recent Labs  Lab 11/05/19 2216 11/05/19 2216 11/06/19 1751 11/06/19 1751 11/07/19 0459 11/07/19 0459 11/08/19 0310 11/09/19 0831 11/10/19 0410 11/11/19 0220 11/12/19 0255  AST 17  --  14*  --  11*  --  12*  --   --   --   --   ALT 17  --  15  --  15  --  15  --   --   --   --   ALKPHOS 104  --  94  --  97  --  89  --   --   --   --   BILITOT 0.4  --  0.4  --  0.6  --  0.5  --   --   --   --   PROT 6.5  --  6.0*  --  6.3*  --  6.1*  --   --   --   --   ALBUMIN 3.1*   < > 2.7*   < > 2.8*   < > 2.7* 2.6* 2.5* 2.0* 2.1*   < > = values in this interval not displayed.   No results for input(s): LIPASE, AMYLASE in the last 168 hours. Recent Labs  Lab 11/05/19 2216  AMMONIA 36*   Coagulation Profile: Recent Labs  Lab 11/06/19 0613  INR 1.0   Cardiac Enzymes: No results for input(s):  CKTOTAL, CKMB, CKMBINDEX, TROPONINI in the last 168 hours. BNP (last 3 results) No results for input(s): PROBNP in the last 8760 hours. HbA1C: No results for input(s): HGBA1C in the last 72 hours. CBG: Recent Labs  Lab 11/11/19 1136 11/11/19 1432 11/11/19 1536 11/11/19 2327 11/12/19 0755  GLUCAP 106* 173* 128* 116* 100*   Lipid Profile: No results for input(s): CHOL, HDL, LDLCALC, TRIG, CHOLHDL, LDLDIRECT in the last 72 hours. Thyroid Function Tests: No results for input(s): TSH, T4TOTAL, FREET4, T3FREE, THYROIDAB in the last 72 hours. Anemia Panel: No results for input(s): VITAMINB12, FOLATE, FERRITIN, TIBC, IRON, RETICCTPCT in the last 72 hours. Sepsis Labs: Recent Labs  Lab 11/06/19 6256  LATICACIDVEN 0.8    Recent Results (from the past 240 hour(s))  SARS Coronavirus 2 by RT PCR (hospital order, performed in [facility] hospital lab) Nasopharyngeal Nasopharyngeal Swab     Status: None   Collection Time: 11/05/19  9:48 PM   Specimen: Nasopharyngeal Swab  Result Value Ref Range Status   SARS Coronavirus 2 NEGATIVE NEGATIVE Final    Comment: (NOTE) SARS-CoV-2 target nucleic acids are NOT DETECTED.  The SARS-CoV-2 RNA is generally detectable in upper and lower respiratory specimens during the acute phase of infection. The lowest concentration of SARS-CoV-2 viral copies this assay can detect is 250 copies / mL. A negative result does not preclude SARS-CoV-2 infection and should not be used as the sole basis for treatment or other patient management decisions.  A negative result may occur with improper specimen collection / handling, submission of specimen  other than nasopharyngeal swab, presence of viral mutation(s) within the areas targeted by this assay, and inadequate number of viral copies (<250 copies / mL). A negative result must be combined with clinical observations, patient history, and epidemiological information.  Fact Sheet for Patients:    StrictlyIdeas.no  Fact Sheet for Healthcare Providers: BankingDealers.co.za  This test is not yet approved or  cleared by the [place] FDA and has been authorized for detection and/or diagnosis of SARS-CoV-2 by FDA under an Emergency Use Authorization (EUA).  This EUA will remain in effect (meaning this test can be used) for the duration of the COVID-19 declaration under Section 564(b)(1) of the Act, 21 U.S.C. section 360bbb-3(b)(1), unless the authorization is terminated or revoked sooner.  Performed at [facility], [place] [address] [place], [place] [address]   Culture, Urine     Status: Abnormal   Collection Time: 11/08/19  1:49 PM   Specimen: Urine, Catheterized  Result Value Ref Range Status   Specimen Description   Final    URINE, CATHETERIZED Performed at [place] [facility] [address], [place], [place] [address]    Special Requests   Final    NONE Performed at [facility], [place] [address], [place], [place] [address]    Culture MULTIPLE SPECIES PRESENT, SUGGEST RECOLLECTION (A)  Final   Report Status 11/09/2019 FINAL  Final  Culture, Urine     Status: Abnormal   Collection Time: 11/09/19 12:04 PM   Specimen: Urine, Random  Result Value Ref Range Status   Specimen Description   Final    URINE, RANDOM Performed at [facility] [address], [place], [place] [address]    Special Requests   Final    NONE Performed at [facility], [place] [address], [place], [place] [address]    Culture (A)  Final    <10,000 COLONIES/mL INSIGNIFICANT GROWTH Performed at [place] [address] [place], [place] [address]    Report Status 11/10/2019 FINAL  Final         Radiology Studies: No results found.      Scheduled Meds: . atorvastatin  10 mg Oral Daily  . chlorhexidine  15 mL Mouth Rinse BID  . Chlorhexidine  Gluconate Cloth  6 each Topical Daily  . feeding supplement (GLUCERNA SHAKE)  237 mL Oral BID BM  . insulin aspart  0-6 Units Subcutaneous TID WC  . mouth rinse  15 mL Mouth Rinse q12n4p  . pantoprazole (PROTONIX) IV  40 mg Intravenous Q12H   Continuous Infusions: . sodium chloride    . dextrose 100 mL/hr at 11/12/19 0939  . ferumoxytol 510 mg (11/08/19 1615)  . sodium chloride       LOS: 6 days    Time spent: 35 minutes    Barb Merino, MD [facility] Pager [phone]

## 2019-11-12 NOTE — Progress Notes (Signed)
Subjective: Feeling well.  No complaints.  Objective: Vital signs in last 24 hours: Temp:  [97.5 F (36.4 C)-98.7 F (37.1 C)] 98.1 F (36.7 C) (09/21 1200) Pulse Rate:  [75-95] 88 (09/21 1400) Resp:  [0-27] 21 (09/21 1400) BP: (82-139)/(43-64) 127/53 (09/21 0239) SpO2:  [93 %-100 %] 100 % (09/21 1400) Arterial Line BP: (82-157)/(31-70) 122/40 (09/21 1400) Weight:  [208.7 kg] 208.7 kg (09/21 0600) Last BM Date: 11/12/19  Intake/Output from previous day: 09/20 0701 - 09/21 0700 In: 6796.8 [P.O.:480; I.V.:2369.8; Blood:1430; IV CLEXNTZGY:1749] Out: 38 [Urine:930] Intake/Output this shift: Total I/O In: 801 [P.O.:1; I.V.:800] Out: 1 [Stool:1]  General appearance: alert and no distress Resp: end expiratory wheezing Cardio: regular rate and rhythm GI: soft, non-tender; bowel sounds normal; no masses,  no organomegaly and morbidly obese Extremities: extremities normal, atraumatic, no cyanosis or edema  Lab Results: Recent Labs    11/10/19 0410 11/11/19 0041 11/11/19 0220 11/11/19 1159 11/11/19 2114 11/12/19 0255 11/12/19 1241  WBC 8.1  --  8.8  --   --   --   --   HGB 8.1*   < > 5.6*   < > 11.0* 11.4* 10.4*  HCT 27.4*   < > 18.7*   < > 33.8* 34.8* 30.6*  PLT 612*  --  453*  --   --   --   --    < > = values in this interval not displayed.   BMET Recent Labs    11/10/19 0410 11/11/19 0220 11/12/19 0255  NA 143 143 147*  K 4.4 4.6 4.3  CL 108 113* 116*  CO2 22 21* 19*  GLUCOSE 105* 129* 119*  BUN 61* 70* 82*  CREATININE 6.64* 5.99* 5.27*  CALCIUM 8.9 7.9* 8.4*   LFT Recent Labs    11/12/19 0255  ALBUMIN 2.1*   PT/INR No results for input(s): LABPROT, INR in the last 72 hours. Hepatitis Panel No results for input(s): HEPBSAG, HCVAB, HEPAIGM, HEPBIGM in the last 72 hours. C-Diff No results for input(s): CDIFFTOX in the last 72 hours. Fecal Lactopherrin No results for input(s): FECLLACTOFRN in the last 72 hours.  Studies/Results: No results  found.  Medications:  Scheduled: . atorvastatin  10 mg Oral Daily  . chlorhexidine  15 mL Mouth Rinse BID  . Chlorhexidine Gluconate Cloth  6 each Topical Daily  . feeding supplement (GLUCERNA SHAKE)  237 mL Oral BID BM  . insulin aspart  0-6 Units Subcutaneous TID WC  . mouth rinse  15 mL Mouth Rinse q12n4p  . pantoprazole (PROTONIX) IV  40 mg Intravenous Q12H   Continuous: . sodium chloride    . dextrose 100 mL/hr at 11/12/19 0939  . ferumoxytol 510 mg (11/08/19 1615)  . sodium chloride      Assessment/Plan: 1) Hematochezia. 2) AKI/CKD. 3) Anemia.   The patient is hemodynamically stable.  Her hypotension resolved.  Nursing reports two episodes of hematochezia and her HGB is holding.  Further evaluation is appropriate with a colonoscopy.  It will be difficult to prep her, but a Flexiseal will help.  Plan: 1) Colonoscopy tomorrow with Dr. Collene Mares.  LOS: 6 days   [name] [name] 11/12/2019, 2:26 PM

## 2019-11-12 NOTE — Progress Notes (Signed)
Physical Therapy Treatment Patient Details Name: [name] [name] MRN: [id number] DOB: January 07, 1956 Today's Date: 11/12/2019    History of Present Illness Patient is 64 y.o. female with PMH significant for HTN, DM, neuropathy, Lt knee fracture, CKD, obesity. Pt admitted from SNF for AMS. Per prior hospital admission  ([facility] home care called and reported unsafe living conditions as caregiver unable to assist pt).  Patient had recent hospital admission on 10/18/2019-10/20/2019 for possible GI bleed.  She was found to have severe constipation and not a GI bleed.  We also found that she had a left knee fracture, unknown when the fracture occurred sometime in August. Pt NWB on Lt LE last admission from 9/1-9/7.    PT Comments    Bed level exercises performed with assistance. Pt declined mobility as she was soiled and needed to be cleaned up.   Follow Up Recommendations  SNF;Supervision/Assistance - 24 hour     Equipment Recommendations  None recommended by PT    Recommendations for Other Services       Precautions / Restrictions Precautions Precautions: Fall Restrictions Other Position/Activity Restrictions: Per last admission MD said "stay off it"  no further guidance on restrictions.  Pt was supposed to f/u Sept 7th per her report but did not.    Mobility  Bed Mobility               General bed mobility comments: NT- pt reports she doesn't want to attempt bed mobility at present, she is soiled and needs to be cleaned up, RN notified  Transfers                    Ambulation/Gait                 Stairs             Wheelchair Mobility    Modified Rankin (Stroke Patients Only)       Balance                                            Cognition Arousal/Alertness: Awake/alert Behavior During Therapy: WFL for tasks assessed/performed Overall Cognitive Status: Within Functional Limits for tasks assessed                                  General Comments: Pt pleasant and A&O x4      Exercises General Exercises - Lower Extremity Ankle Circles/Pumps: AROM;Both;20 reps;Supine Quad Sets: AROM;Both;15 reps;Supine Gluteal Sets: AROM;Both;Supine;10 reps Heel Slides: AAROM;Both;10 reps;Supine   Shoulder flexion AAROM; both; 15 reps; supine    General Comments        Pertinent Vitals/Pain Faces Pain Scale: Hurts a little bit Pain Location: L knee with movement Pain Descriptors / Indicators: Grimacing Pain Intervention(s): Repositioned;Limited activity within patient's tolerance;Monitored during session    Home Living                      Prior Function            PT Goals (current goals can now be found in the care plan section) Acute Rehab PT Goals Patient Stated Goal: get up and move and walk again PT Goal Formulation: With patient Time For Goal Achievement: 11/22/19 Potential to Achieve Goals: Fair Progress towards PT goals: Progressing toward  goals    Frequency    Min 2X/week      PT Plan Current plan remains appropriate    Co-evaluation              AM-PAC PT "6 Clicks" Mobility   Outcome Measure  Help needed turning from your back to your side while in a flat bed without using bedrails?: Total Help needed moving from lying on your back to sitting on the side of a flat bed without using bedrails?: Total Help needed moving to and from a bed to a chair (including a wheelchair)?: Total Help needed standing up from a chair using your arms (e.g., wheelchair or bedside chair)?: Total Help needed to walk in hospital room?: Total Help needed climbing 3-5 steps with a railing? : Total 6 Click Score: 6    End of Session   Activity Tolerance: Patient tolerated treatment well Patient left: in bed;with call bell/phone within reach;with bed alarm set Nurse Communication: Mobility status PT Visit Diagnosis: Muscle weakness (generalized) (M62.81);Other abnormalities  of gait and mobility (R26.89);Difficulty in walking, not elsewhere classified (R26.2)     Time: 0355-9741 PT Time Calculation (min) (ACUTE ONLY): 25 min  Charges:  $Therapeutic Exercise: 23-37 mins                    Blondell Reveal Kistler PT 11/12/2019  Acute Rehabilitation Services Pager [phone] Office [phone]

## 2019-11-13 ENCOUNTER — Encounter (HOSPITAL_COMMUNITY)
Admission: EM | Disposition: A | Discharge status: Skilled Nursing Facility | Payer: Self-pay | Source: Skilled Nursing Facility | Attending: Internal Medicine

## 2019-11-13 ENCOUNTER — Encounter (HOSPITAL_COMMUNITY): Payer: Self-pay | Admitting: Internal Medicine

## 2019-11-13 ENCOUNTER — Inpatient Hospital Stay (HOSPITAL_COMMUNITY): Payer: No Typology Code available for payment source | Admitting: Anesthesiology

## 2019-11-13 HISTORY — PX: COLONOSCOPY WITH PROPOFOL: SHX5780

## 2019-11-13 LAB — HEMOGLOBIN AND HEMATOCRIT, BLOOD
HCT: 26.5 % — ABNORMAL LOW (ref 36.0–46.0)
HCT: 25.2 % — ABNORMAL LOW (ref 36.0–46.0)
HCT: 25.9 % — ABNORMAL LOW (ref 36.0–46.0)
Hemoglobin: 8.8 g/dL — ABNORMAL LOW (ref 12.0–15.0)
Hemoglobin: 8.2 g/dL — ABNORMAL LOW (ref 12.0–15.0)
Hemoglobin: 8.5 g/dL — ABNORMAL LOW (ref 12.0–15.0)

## 2019-11-13 LAB — GLUCOSE, CAPILLARY
Glucose-Capillary: 127 mg/dL — ABNORMAL HIGH (ref 70–99)
Glucose-Capillary: 139 mg/dL — ABNORMAL HIGH (ref 70–99)
Glucose-Capillary: 93 mg/dL (ref 70–99)

## 2019-11-13 LAB — RENAL FUNCTION PANEL
Albumin: 2 g/dL — ABNORMAL LOW (ref 3.5–5.0)
Anion gap: 6 (ref 5–15)
BUN: 77 mg/dL — ABNORMAL HIGH (ref 8–23)
CO2: 18 mmol/L — ABNORMAL LOW (ref 22–32)
Calcium: 7.7 mg/dL — ABNORMAL LOW (ref 8.9–10.3)
Chloride: 116 mmol/L — ABNORMAL HIGH (ref 98–111)
Creatinine, Ser: 4.27 mg/dL — ABNORMAL HIGH (ref 0.44–1.00)
GFR calc Af Amer: 12 mL/min — ABNORMAL LOW (ref >60)
GFR calc non Af Amer: 10 mL/min — ABNORMAL LOW (ref >60)
Glucose, Bld: 145 mg/dL — ABNORMAL HIGH (ref 70–99)
Phosphorus: 4.2 mg/dL (ref 2.5–4.6)
Potassium: 4.2 mmol/L (ref 3.5–5.1)
Sodium: 140 mmol/L (ref 135–145)

## 2019-11-13 SURGERY — COLONOSCOPY WITH PROPOFOL
Anesthesia: Monitor Anesthesia Care

## 2019-11-13 MED ORDER — PHENYLEPHRINE 40 MCG/ML (10ML) SYRINGE FOR IV PUSH (FOR BLOOD PRESSURE SUPPORT)
PREFILLED_SYRINGE | INTRAVENOUS | Status: DC | PRN
  Administered 2019-11-13: 80 ug via INTRAVENOUS

## 2019-11-13 MED ORDER — PROPOFOL 500 MG/50ML IV EMUL
INTRAVENOUS | Status: DC | PRN
  Administered 2019-11-13: 75 ug/kg/min via INTRAVENOUS

## 2019-11-13 MED ORDER — FUROSEMIDE 10 MG/ML IJ SOLN
40.0000 mg | Freq: Once | INTRAMUSCULAR | Status: AC
  Administered 2019-11-13: 40 mg via INTRAVENOUS
  Filled 2019-11-13: qty 4

## 2019-11-13 MED ORDER — PROPOFOL 10 MG/ML IV BOLUS
INTRAVENOUS | Status: DC | PRN
  Administered 2019-11-13 (×2): 10 mg via INTRAVENOUS

## 2019-11-13 MED ORDER — ALBUMIN HUMAN 25 % IV SOLN
25.0000 g | Freq: Two times a day (BID) | INTRAVENOUS | Status: AC
  Administered 2019-11-13 – 2019-11-15 (×4): 25 g via INTRAVENOUS
  Filled 2019-11-13 (×4): qty 100

## 2019-11-13 SURGICAL SUPPLY — 22 items

## 2019-11-13 NOTE — Anesthesia Preprocedure Evaluation (Addendum)
Anesthesia Evaluation  Patient identified by MRN, date of birth, ID band Patient awake    Reviewed: Allergy & Precautions, NPO status , Patient's Chart, lab work & pertinent test results, reviewed documented beta blocker date and time   Airway Mallampati: II  TM Distance: >3 FB Neck ROM: Full    Dental  (+) Dental Advisory Given, Edentulous Upper   Pulmonary former smoker,    Pulmonary exam normal breath sounds clear to auscultation       Cardiovascular hypertension, Pt. on medications and Pt. on home beta blockers Normal cardiovascular exam Rhythm:Regular Rate:Normal     Neuro/Psych PSYCHIATRIC DISORDERS Anxiety Depression negative neurological ROS     GI/Hepatic Neg liver ROS, Hematochezia   Endo/Other  diabetes, Type 2, Oral Hypoglycemic AgentsMorbid obesity  Renal/GU Renal InsufficiencyRenal disease     Musculoskeletal negative musculoskeletal ROS (+)   Abdominal   Peds  Hematology  (+) Blood dyscrasia, anemia ,   Anesthesia Other Findings Day of surgery medications reviewed with the patient.  Reproductive/Obstetrics                           Anesthesia Physical Anesthesia Plan  ASA: IV  Anesthesia Plan: MAC   Post-op Pain Management:    Induction: Intravenous  PONV Risk Score and Plan: 2 and Propofol infusion and Treatment may vary due to age or medical condition  Airway Management Planned: Natural Airway  Additional Equipment:   Intra-op Plan:   Post-operative Plan:   Informed Consent: I have reviewed the patients History and Physical, chart, labs and discussed the procedure including the risks, benefits and alternatives for the proposed anesthesia with the patient or authorized representative who has indicated his/her understanding and acceptance.       Plan Discussed with: CRNA  Anesthesia Plan Comments:        Anesthesia Quick Evaluation

## 2019-11-13 NOTE — Progress Notes (Addendum)
PROGRESS NOTE    [name] [name]  JSE:[id number] DOB: [birth year] DOA: 11/05/2019 PCP: [name], MD    Brief Narrative:64 year old female with super morbid obesity, hypertension, type 2 diabetes, stage IV chronic kidney disease with baseline creatinine about 1.7, recent left leg injury and fracture recovering at the skilled nursing facility brought to the ER on 9/14 for confusion.  Patient was reportedly unresponsive to verbal stimuli and subsequently was incoherent.  In the emergency room, she improved spontaneously and responded to questions appropriately.  Patient is on multiple medications including narcotics and benzodiazepine.  Admitted with mild acute metabolic encephalopathy and acute renal failure. Patient continued to have diarrhea  9/19 evening: Patient started having blood mixed with stool and then frank bleeding per rectum with drop in hemoglobin. 9/20: Severe lower GI bleed with multiple fresh bleed per rectum, hypotensive and hemoglobin 5.6 with baseline hemoglobin 11. Resuscitation, 6 units of PRBC, multiple isotonic fluids with stabilization.   Assessment & Plan:   Principal Problem:   Acute renal failure (HCC) Active Problems:   Type 2 diabetes mellitus with stage 4 chronic kidney disease (HCC)   Metabolic encephalopathy   CKD (chronic kidney disease) stage 4, GFR 15-29 ml/min (HCC)   Pressure injury of skin   Morbid obesity (HCC)   GI bleeding   Encephalopathy acute   Vaginal discharge   Anemia associated with acute blood loss     #1 AKI with CKD stage IV this is thought to be due to multifactorial causes including hypotension multiple medications including chlorthalidone.  It appears her creatinine is improving after Lasix challenge.  Nephrology following appreciate.  #2  Acute on chronic anemia secondary to lower GI bleed she received 6 units of packed RBC.  Patient to have colonoscopy later today.  Her baseline hemoglobin is around 11.  She was  also given a unit of Feraheme.  #3 acute metabolic encephalopathy seems to be improving this is thought to be likely related to polypharmacy.  Prior to admission patient was on Percocet hydroxyzine Cymbalta Neurontin and Xanax at the nursing home.  These medications have been on hold mostly except for low-dose Percocet and intermittent Atarax that have been restarted.  She is awake and alert today and answers all my questions appropriately.  #4 type 2 diabetes with hypoglycemic episodes on SSI. CBG (last 3)  Recent Labs    11/12/19 2023 11/13/19 0731 11/13/19 1115  GLUCAP 113* 127* 139*    #5 status post UTI treated with Rocephin for 3 days.   Estimated body mass index is 84.14 kg/m as calculated from the following:   Height as of this encounter: 5\' 2"  (1.575 m).   Weight as of this encounter: 208.7 kg.  DVT prophylaxis: SCD code Status: Full code Family Communication: None at bedside Disposition Plan:  Status is: Inpatient  Dispo:  Patient From: [place]  Planned Disposition: [place]  Expected discharge date: Unknown  medically stable for discharge: No  Consultants: GI nephrology   Procedures: A-line, subclavian line Antimicrobials: Anti-infectives (From admission, onward)   Start     Dose/Rate Route Frequency Ordered Stop   11/06/19 0600  metroNIDAZOLE (FLAGYL) IVPB 500 mg  Status:  Discontinued        500 mg 100 mL/hr over 60 Minutes Intravenous Every 8 hours 11/06/19 0417 11/11/19 1012   11/06/19 0500  cefTRIAXone (ROCEPHIN) 1 g in sodium chloride 0.9 % 100 mL IVPB  Status:  Discontinued  1 g 200 mL/hr over 30 Minutes Intravenous Every 24 hours 11/06/19 0417 11/09/19 1029       Subjective:  She is resting in bed she is awake and alert she is aware that she is going for colonoscopy today   Objective: Vitals:   11/13/19 0900 11/13/19 1000 11/13/19 1100 11/13/19 1200  BP: (!) 91/45 (!) 91/45 (!) 95/54 (!) 94/40  Pulse:  84 81 86 90  Resp: 17 19 18  (!) 21  Temp:    98 F (36.7 C)  TempSrc:    Oral  SpO2: 100% 99% 98% 98%  Weight:      Height:        Intake/Output Summary (Last 24 hours) at 11/13/2019 1257 Last data filed at 11/13/2019 0800 Gross per 24 hour  Intake 1800 ml  Output 1820 ml  Net -20 ml   Filed Weights   11/06/19 1830 11/12/19 0600  Weight: (!) 184.6 kg (!) 208.7 kg    Examination: Flexi-Seal in place with loose stools General exam: Appears calm and comfortable  Respiratory system: Clear to auscultation. Respiratory effort normal. Cardiovascular system: S1 & S2 heard, RRR. No JVD, murmurs, rubs, gallops or clicks. No pedal edema. Gastrointestinal system: Abdomen is distended, soft and nontender. No organomegaly or masses felt. Normal bowel sounds heard. Central nervous system: Alert and oriented. No focal neurological deficits. Extremities: Symmetric 5 x 5 power. Skin: No rashes, lesions or ulcers Psychiatry: Judgement and insight appear normal. Mood & affect appropriate.     Data Reviewed: I have personally reviewed following labs and imaging studies  CBC: Recent Labs  Lab 11/07/19 0459 11/07/19 0459 11/08/19 0310 11/08/19 0310 11/09/19 0831 11/09/19 0831 11/10/19 0410 11/11/19 0041 11/11/19 0220 11/11/19 1159 11/12/19 0255 11/12/19 1241 11/12/19 1903 11/13/19 0257 11/13/19 1156  WBC 6.1  --  7.1  --  6.7  --  8.1  --  8.8  --   --   --   --   --   --   HGB 8.9*   < > 8.4*   < > 8.3*   < > 8.1*   < > 5.6*   < > 11.4* 10.4* 9.3* 8.8* 8.2*  HCT 29.6*   < > 28.1*   < > 28.1*   < > 27.4*   < > 18.7*   < > 34.8* 30.6* 27.5* 26.5* 25.2*  MCV 84.1  --  84.4  --  84.4  --  84.3  --  84.6  --   --   --   --   --   --   PLT 651*  --  665*  --  608*  --  612*  --  453*  --   --   --   --   --   --    < > = values in this interval not displayed.   Basic Metabolic Panel: Recent Labs  Lab 11/09/19 0831 11/10/19 0410 11/11/19 0220 11/12/19 0255 11/13/19 0257  NA 140  143 143 147* 140  K 4.6 4.4 4.6 4.3 4.2  CL 106 108 113* 116* 116*  CO2 22 22 21* 19* 18*  GLUCOSE 82 105* 129* 119* 145*  BUN 61* 61* 70* 82* 77*  CREATININE 6.86* 6.64* 5.99* 5.27* 4.27*  CALCIUM 9.4 8.9 7.9* 8.4* 7.7*  PHOS 8.4* 7.8* 6.6* 5.6* 4.2   GFR: Estimated Creatinine Clearance: 23.2 mL/min (A) (by C-G formula based on SCr of 4.27 mg/dL (H)). Liver Function Tests: Recent Labs  Lab 11/06/19 1751 11/06/19 1751 11/07/19 0459 11/07/19 0459 11/08/19 0310 11/08/19 0310 11/09/19 0831 11/10/19 0410 11/11/19 0220 11/12/19 0255 11/13/19 0257  AST 14*  --  11*  --  12*  --   --   --   --   --   --   ALT 15  --  15  --  15  --   --   --   --   --   --   ALKPHOS 94  --  97  --  89  --   --   --   --   --   --   BILITOT 0.4  --  0.6  --  0.5  --   --   --   --   --   --   PROT 6.0*  --  6.3*  --  6.1*  --   --   --   --   --   --   ALBUMIN 2.7*   < > 2.8*   < > 2.7*   < > 2.6* 2.5* 2.0* 2.1* 2.0*   < > = values in this interval not displayed.   No results for input(s): LIPASE, AMYLASE in the last 168 hours. No results for input(s): AMMONIA in the last 168 hours. Coagulation Profile: No results for input(s): INR, PROTIME in the last 168 hours. Cardiac Enzymes: No results for input(s): CKTOTAL, CKMB, CKMBINDEX, TROPONINI in the last 168 hours. BNP (last 3 results) No results for input(s): PROBNP in the last 8760 hours. HbA1C: No results for input(s): HGBA1C in the last 72 hours. CBG: Recent Labs  Lab 11/12/19 1128 11/12/19 1614 11/12/19 2023 11/13/19 0731 11/13/19 1115  GLUCAP 115* 117* 113* 127* 139*   Lipid Profile: No results for input(s): CHOL, HDL, LDLCALC, TRIG, CHOLHDL, LDLDIRECT in the last 72 hours. Thyroid Function Tests: No results for input(s): TSH, T4TOTAL, FREET4, T3FREE, THYROIDAB in the last 72 hours. Anemia Panel: No results for input(s): VITAMINB12, FOLATE, FERRITIN, TIBC, IRON, RETICCTPCT in the last 72 hours. Sepsis Labs: No results for  input(s): PROCALCITON, LATICACIDVEN in the last 168 hours.  Recent Results (from the past 240 hour(s))  SARS Coronavirus 2 by RT PCR (hospital order, performed in [facility] hospital lab) Nasopharyngeal Nasopharyngeal Swab     Status: None   Collection Time: 11/05/19  9:48 PM   Specimen: Nasopharyngeal Swab  Result Value Ref Range Status   SARS Coronavirus 2 NEGATIVE NEGATIVE Final    Comment: (NOTE) SARS-CoV-2 target nucleic acids are NOT DETECTED.  The SARS-CoV-2 RNA is generally detectable in upper and lower respiratory specimens during the acute phase of infection. The lowest concentration of SARS-CoV-2 viral copies this assay can detect is 250 copies / mL. A negative result does not preclude SARS-CoV-2 infection and should not be used as the sole basis for treatment or other patient management decisions.  A negative result may occur with improper specimen collection / handling, submission of specimen other than nasopharyngeal swab, presence of viral mutation(s) within the areas targeted by this assay, and inadequate number of viral copies (<250 copies / mL). A negative result must be combined with clinical observations, patient history, and epidemiological information.  Fact Sheet for Patients:   StrictlyIdeas.no  Fact Sheet for Healthcare Providers: BankingDealers.co.za  This test is not yet approved or  cleared by the [place] FDA and has been authorized for detection and/or diagnosis of SARS-CoV-2 by FDA under an Emergency Use Authorization (EUA).  This  EUA will remain in effect (meaning this test can be used) for the duration of the COVID-19 declaration under Section 564(b)(1) of the Act, 21 U.S.C. section 360bbb-3(b)(1), unless the authorization is terminated or revoked sooner.  Performed at [facility], [employer] [address] [place], [place] [address]   Culture, Urine     Status: Abnormal    Collection Time: 11/08/19  1:49 PM   Specimen: Urine, Catheterized  Result Value Ref Range Status   Specimen Description   Final    URINE, CATHETERIZED Performed at [facility] [address] [place], [place] [address]    Special Requests   Final    NONE Performed at [facility], [place] [address] [place], [place] [address]    Culture MULTIPLE SPECIES PRESENT, SUGGEST RECOLLECTION (A)  Final   Report Status 11/09/2019 FINAL  Final  Culture, Urine     Status: Abnormal   Collection Time: 11/09/19 12:04 PM   Specimen: Urine, Random  Result Value Ref Range Status   Specimen Description   Final    URINE, RANDOM Performed at [facility] [address] [place], [place] [address]    Special Requests   Final    NONE Performed at [facility], [place] [address] [place], [place] [address]    Culture (A)  Final    <10,000 COLONIES/mL INSIGNIFICANT GROWTH Performed at [facility] [address] [place], [place] [address]    Report Status 11/10/2019 FINAL  Final         Radiology Studies: No results found.      Scheduled Meds: . atorvastatin  10 mg Oral Daily  . chlorhexidine  15 mL Mouth Rinse BID  . Chlorhexidine Gluconate Cloth  6 each Topical Daily  . feeding supplement (GLUCERNA SHAKE)  237 mL Oral BID BM  . furosemide  40 mg Intravenous Once  . insulin aspart  0-6 Units Subcutaneous TID WC  . mouth rinse  15 mL Mouth Rinse q12n4p  . pantoprazole (PROTONIX) IV  40 mg Intravenous Q12H   Continuous Infusions: . sodium chloride    . sodium chloride    . albumin human    . ferumoxytol 510 mg (11/08/19 1615)  . sodium chloride       LOS: 7 days     Georgette Shell, MD 11/13/2019, 12:57 PM

## 2019-11-13 NOTE — TOC Progression Note (Signed)
Transition of Care [facility]) - Progression Note    Patient Details  Name: [name] [name] MRN: [id number] Date of Birth: 1955/06/29  [facility]) CM/SW Contact  Leeroy Cha, RN Phone Number: 11/13/2019, 9:20 AM  Clinical Narrative:    hgb down to 8.8 -1unit prbcs, iv ferahme 510 mg to be given on [id number] for weekly does,bun 77/creat 4.27,  Plan return to [facility] when stable.   Expected Discharge Plan: [place] Barriers to Discharge: Continued Medical Work up, Ship broker  Expected Discharge Plan and Services Expected Discharge Plan: [place] Choice: [place] arrangements for the past 2 months: [place], Single Family Home Expected Discharge Date:  (unknown)                 DME Agency: NA       HH Arranged: NA           Social Determinants of Health (SDOH) Interventions    Readmission Risk Interventions Readmission Risk Prevention Plan 11/08/2019  Transportation Screening Complete  Medication Review Press photographer) Referral to Pharmacy  PCP or Specialist appointment within 3-5 days of discharge Complete  HRI or [place] Complete  SW Recovery Care/Counseling Consult Complete  Palliative Care Screening Not Applicable  Skilled Nursing Facility Complete  Some recent data might be hidden

## 2019-11-13 NOTE — Op Note (Signed)
[facility] Patient Name: [name] [name] Procedure Date: 11/13/2019 MRN: [id number] Attending MD: Juanita Craver , MD Date of Birth: 23-May-1955 CSN: [id number] Age: 64 Admit Type: Outpatient Procedure:                Diagnostic colonoscopy. Indications:              Rectal bleeding, Post-hemorrhagic anemia; CRC                            screening for colorectal malignant neoplasm. Providers:                Juanita Craver, MD, Baird Cancer, RN, Laverda Sorenson,                            Technician, Janee Morn, Technician, Stephanie                            [place] CRNA Referring MD:             [facility] Medicines:                Monitored Anesthesia Care Complications:            No immediate complications. Estimated Blood Loss:     Estimated blood loss: none. Procedure:                Pre-Anesthesia Assessment: - Prior to the                            procedure, a history and physical was performed,                            and patient medications and allergies were                            reviewed. The patient's tolerance of previous                            anesthesia was also reviewed. The risks and                            benefits of the procedure and the sedation options                            and risks were discussed with the patient. All                            questions were answered, and informed consent was                            obtained. Prior Anticoagulants: The patient has                            taken no previous anticoagulant or antiplatelet                            agents. ASA Grade  Assessment: IV - A patient with                            severe systemic disease that is a constant threat                            to life. After reviewing the risks and benefits,                            the patient was deemed in satisfactory condition to                            undergo the procedure. After obtaining informed                             consent, the colonoscope was passed under direct                            vision. Throughout the procedure, the patient's                            blood pressure, pulse, and oxygen saturations were                            monitored continuously. The [id number] ([id number])                            Olympus colonoscope was introduced through the anus                            and advanced to the the cecum, identified by                            appendiceal orifice and ileocecal valve. The                            colonoscopy was extremely difficult due to                            inadequate bowel prep. Successful completion of the                            procedure was aided by lavage. The patient                            tolerated the procedure well. The quality of the                            bowel preparation was fair. The terminal ileum, the                            ileocecal valve and the rectum were photographed.  The bowel preparation used was NuLytely via split                            dose instruction. Scope In: 3:17:37 PM Scope Out: 3:37:44 PM Scope Withdrawal Time: 0 hours 10 minutes 40 seconds  Total Procedure Duration: 0 hours 20 minutes 7 seconds  Findings:      A few small and large-mouthed diverticula were found in the ascending       colon.      The exam was otherwise without abnormality on direct and retroflexion       views.      There was a lot of debris and stool in the colon-small polyps/lesions       could be missed. Impression:               - Preparation of the colon was fair at best inspite                            of aggressive lavage-polyps/AVM's could be missed.                           - Few scattered diverticula in the ascending colon.                           - The examination was otherwise normal on direct                            and retroflexion views.                           - No  specimens collected. Moderate Sedation:      MAC used. Recommendation:           - Clear liquid diet daily.                           - Continue present medications.                           - Check serial CBC's-patient will probably need an                            EGD and if that is normal a VCE may need to be done. Procedure Code(s):        --- Professional ---                           [phone], Colonoscopy, flexible; diagnostic, including                            collection of specimen(s) by brushing or washing,                            when performed (separate procedure) Diagnosis Code(s):        --- Professional ---                           D62,  Acute posthemorrhagic anemia                           K57.30, Diverticulosis of large intestine without                            perforation or abscess without bleeding                           Z12.11, Encounter for screening for malignant                            neoplasm of colon CPT copyright 2019 American Medical Association. All rights reserved. The codes documented in this report are preliminary and upon coder review may  be revised to meet current compliance requirements. Juanita Craver, MD Juanita Craver, MD 11/13/2019 4:01:01 PM This report has been signed electronically. Number of Addenda: 0

## 2019-11-13 NOTE — Anesthesia Postprocedure Evaluation (Signed)
Anesthesia Post Note  Patient: [name] [name]  Procedure(s) Performed: COLONOSCOPY WITH PROPOFOL (N/A )     Patient location during evaluation: Endoscopy Anesthesia Type: MAC Level of consciousness: awake and alert Pain management: pain level controlled Vital Signs Assessment: post-procedure vital signs reviewed and stable Respiratory status: spontaneous breathing, nonlabored ventilation, respiratory function stable and patient connected to nasal cannula oxygen Cardiovascular status: stable and blood pressure returned to baseline Postop Assessment: no apparent nausea or vomiting Anesthetic complications: no   No complications documented.  Last Vitals:  Vitals:   11/13/19 1444 11/13/19 1557  BP: (!) 77/15 (!) 108/57  Pulse: 83 81  Resp: (!) 23 18  Temp: 37.1 C 36.6 C  SpO2: 100% 98%    Last Pain:  Vitals:   11/13/19 1557  TempSrc: Oral  PainSc: 3                  Catalina Gravel

## 2019-11-13 NOTE — Progress Notes (Signed)
Nephrology Follow-Up Consult note   Summary: [name] [name] is a/an 64 y.o. female with a past medical history significant for DM 2, morbid obesity, HTN, neuropathy, CKD, admitted for altered mental status.    Problems: Non-Oliguric AKI on CKD: b/l creat 1.5- 1.8, eGFR 33- 38 ml/min. Likely ATN w/ effects of diuretics, hypotension, UTI and poor po intake all contributing. No signs of obstruction on CT scan.  UA with pyuria but also numerous bacteria.  Good UOP 700 cc yest and 700 cc so far today. Admit creat 6.2 on 9/14, peaked at 7.15 on 9/17, now dropping 6.4 > 5.9 > 5.27 > 4.25 today. Good UOP. Give IV lasix x 1 today, markedly edematous.   Volume - sig leg edema, wt's are ^^ 50- 60lbs. Will give IV lasix 40 once today.   GI bleed - acute, low BP's have resolved. Per pmd/ GI.   Morbid obesity: Likely has contributed to CKD over time  Diabetes mellitus type 2: Management per primary.  Anemia: Hemoglobin 8.3.  Iron sat 7 and ferritin 9.  Status post Feraheme 1 dose repeat dose in 1 week.    Diarrhea: Being treated with Imodium by primary team.  Continue work-up per them.   [facility] 11/13/2019 2:50 PM  _________________________________________________________ Interval History/Subjective: Doing good today.  Creat down again to 5.2.   Constitutional: Morbidly obese, no distress, lying flat, in no distress ENMT: ears and nose without scars or lesions, MMM CV: normal rate, 2+ diffuse bilat leg edema Respiratory: Lying flat, bilateral chest rise, normal work of breathing Gastrointestinal: soft, non-tender, no palpable masses or hernias Skin: no visible lesions or rashes Psych: alert, judgement/insight appropriate, appropriate mood and affect Neuro: no asterixis present  Medications:  Current Facility-Administered Medications  Medication Dose Route Frequency Provider Last Rate Last Admin   [MAR Hold] 0.9 %  sodium chloride infusion    Intra-arterial PRN Barb Merino, MD       0.9 %  sodium chloride infusion   Intravenous Continuous Carol Ada, MD       [MAR Hold] acetaminophen (TYLENOL) tablet 650 mg  650 mg Oral Q6H PRN Mujtaba, Mohammadtokir, MD   650 mg at 11/07/19 1343   Or   [MAR Hold] acetaminophen (TYLENOL) suppository 650 mg  650 mg Rectal Q6H PRN Mujtaba, Mohammadtokir, MD       [MAR Hold] albumin human 25 % solution 25 g  25 g Intravenous Q12H Roney Jaffe, MD       [MAR Hold] albuterol (PROVENTIL) (2.5 MG/3ML) 0.083% nebulizer solution 2.5 mg  2.5 mg Nebulization Q2H PRN Mujtaba, Mohammadtokir, MD       [MAR Hold] atorvastatin (LIPITOR) tablet 10 mg  10 mg Oral Daily Mujtaba, Mohammadtokir, MD   10 mg at 11/13/19 1030   [MAR Hold] chlorhexidine (PERIDEX) 0.12 % solution 15 mL  15 mL Mouth Rinse BID Barb Merino, MD   15 mL at 11/13/19 1030   [MAR Hold] Chlorhexidine Gluconate Cloth 2 % PADS 6 each  6 each Topical Daily Annita Brod, MD   6 each at 11/13/19 1031   [MAR Hold] feeding supplement (GLUCERNA SHAKE) (GLUCERNA SHAKE) liquid 237 mL  237 mL Oral BID BM Barb Merino, MD   237 mL at 11/11/19 1347   [MAR Hold] ferumoxytol (FERAHEME) 510 mg in sodium chloride 0.9 % 100 mL IVPB  510 mg Intravenous Weekly Reesa Chew, MD 468 mL/hr at 11/08/19 1615 510 mg at 11/08/19 1615   [  MAR Hold] furosemide (LASIX) injection 40 mg  40 mg Intravenous Once Roney Jaffe, MD       [facility] Hold] haloperidol lactate (HALDOL) injection 2 mg  2 mg Intravenous Q6H PRN Lang Snow, FNP   2 mg at 11/11/19 2139   [facility] Hold] HYDROcodone-acetaminophen (NORCO/VICODIN) 5-325 MG per tablet 1 tablet  1 tablet Oral Q8H PRN Annita Brod, MD   1 tablet at 11/11/19 2106   [facility] Hold] hydrOXYzine (ATARAX/VISTARIL) tablet 50 mg  50 mg Oral TID PRN Barb Merino, MD   50 mg at 11/11/19 2106   [MAR Hold] insulin aspart (novoLOG) injection 0-6 Units  0-6 Units Subcutaneous TID WC Barb Merino, MD       [facility]  Hold] lip balm (CARMEX) ointment   Topical PRN Barb Merino, MD       [facility] Hold] loperamide (IMODIUM) capsule 2 mg  2 mg Oral Q6H PRN Barb Merino, MD   2 mg at 11/10/19 1325   [MAR Hold] MEDLINE mouth rinse  15 mL Mouth Rinse q12n4p Barb Merino, MD   15 mL at 11/12/19 1630   [MAR Hold] ondansetron (ZOFRAN) injection 4 mg  4 mg Intravenous Q6H PRN Annita Brod, MD   4 mg at 11/10/19 1621   [MAR Hold] pantoprazole (PROTONIX) injection 40 mg  40 mg Intravenous Q12H Barb Merino, MD   40 mg at 11/13/19 1029   [MAR Hold] sodium chloride flush (NS) 0.9 % injection 10-40 mL  10-40 mL Intracatheter PRN Annita Brod, MD          Review of Systems: 10 systems reviewed and negative except per interval history/subjective  Physical Exam: Vitals:   11/13/19 1200 11/13/19 1444  BP: (!) 94/40 (!) 77/15  Pulse: 90 83  Resp: (!) 21 (!) 23  Temp: 98 F (36.7 C) 98.8 F (37.1 C)  SpO2: 98% 100%   Total I/O In: 400 [I.V.:400] Out: -   Intake/Output Summary (Last 24 hours) at 11/13/2019 1450 Last data filed at 11/13/2019 0800 Gross per 24 hour  Intake 1600 ml  Output 1820 ml  Net -220 ml      Test Results I personally reviewed new and old clinical labs and radiology tests Lab Results  Component Value Date   NA 140 11/13/2019   K 4.2 11/13/2019   CL 116 (H) 11/13/2019   CO2 18 (L) 11/13/2019   BUN 77 (H) 11/13/2019   CREATININE 4.27 (H) 11/13/2019   CALCIUM 7.7 (L) 11/13/2019   ALBUMIN 2.0 (L) 11/13/2019   PHOS 4.2 11/13/2019

## 2019-11-13 NOTE — H&P (View-Only) (Signed)
PROGRESS NOTE    [name] [name]  UMP:[id number] DOB: 1955-09-06 DOA: 11/05/2019 PCP: [name], MD    Brief Narrative:64 year old female with super morbid obesity, hypertension, type 2 diabetes, stage IV chronic kidney disease with baseline creatinine about 1.7, recent left leg injury and fracture recovering at the skilled nursing facility brought to the ER on 9/14 for confusion.  Patient was reportedly unresponsive to verbal stimuli and subsequently was incoherent.  In the emergency room, she improved spontaneously and responded to questions appropriately.  Patient is on multiple medications including narcotics and benzodiazepine.  Admitted with mild acute metabolic encephalopathy and acute renal failure. Patient continued to have diarrhea  9/19 evening: Patient started having blood mixed with stool and then frank bleeding per rectum with drop in hemoglobin. 9/20: Severe lower GI bleed with multiple fresh bleed per rectum, hypotensive and hemoglobin 5.6 with baseline hemoglobin 11. Resuscitation, 6 units of PRBC, multiple isotonic fluids with stabilization.   Assessment & Plan:   Principal Problem:   Acute renal failure (HCC) Active Problems:   Type 2 diabetes mellitus with stage 4 chronic kidney disease (HCC)   Metabolic encephalopathy   CKD (chronic kidney disease) stage 4, GFR 15-29 ml/min (HCC)   Pressure injury of skin   Morbid obesity (HCC)   GI bleeding   Encephalopathy acute   Vaginal discharge   Anemia associated with acute blood loss     #1 AKI with CKD stage IV this is thought to be due to multifactorial causes including hypotension multiple medications including chlorthalidone.  It appears her creatinine is improving after Lasix challenge.  Nephrology following appreciate.  #2  Acute on chronic anemia secondary to lower GI bleed she received 6 units of packed RBC.  Patient to have colonoscopy later today.  Her baseline hemoglobin is around 11.  She was  also given a unit of Feraheme.  #3 acute metabolic encephalopathy seems to be improving this is thought to be likely related to polypharmacy.  Prior to admission patient was on Percocet hydroxyzine Cymbalta Neurontin and Xanax at the nursing home.  These medications have been on hold mostly except for low-dose Percocet and intermittent Atarax that have been restarted.  She is awake and alert today and answers all my questions appropriately.  #4 type 2 diabetes with hypoglycemic episodes on SSI. CBG (last 3)  Recent Labs    11/12/19 2023 11/13/19 0731 11/13/19 1115  GLUCAP 113* 127* 139*    #5 status post UTI treated with Rocephin for 3 days.   Estimated body mass index is 84.14 kg/m as calculated from the following:   Height as of this encounter: 5\' 2"  (1.575 m).   Weight as of this encounter: 208.7 kg.  DVT prophylaxis: SCD code Status: Full code Family Communication: None at bedside Disposition Plan:  Status is: Inpatient  Dispo:  Patient From: [place]  Planned Disposition: [place]  Expected discharge date: Unknown  medically stable for discharge: No  Consultants: GI nephrology   Procedures: A-line, subclavian line Antimicrobials: Anti-infectives (From admission, onward)   Start     Dose/Rate Route Frequency Ordered Stop   11/06/19 0600  metroNIDAZOLE (FLAGYL) IVPB 500 mg  Status:  Discontinued        500 mg 100 mL/hr over 60 Minutes Intravenous Every 8 hours 11/06/19 0417 11/11/19 1012   11/06/19 0500  cefTRIAXone (ROCEPHIN) 1 g in sodium chloride 0.9 % 100 mL IVPB  Status:  Discontinued  1 g 200 mL/hr over 30 Minutes Intravenous Every 24 hours 11/06/19 0417 11/09/19 1029       Subjective:  She is resting in bed she is awake and alert she is aware that she is going for colonoscopy today   Objective: Vitals:   11/13/19 0900 11/13/19 1000 11/13/19 1100 11/13/19 1200  BP: (!) 91/45 (!) 91/45 (!) 95/54 (!) 94/40  Pulse:  84 81 86 90  Resp: 17 19 18  (!) 21  Temp:    98 F (36.7 C)  TempSrc:    Oral  SpO2: 100% 99% 98% 98%  Weight:      Height:        Intake/Output Summary (Last 24 hours) at 11/13/2019 1257 Last data filed at 11/13/2019 0800 Gross per 24 hour  Intake 1800 ml  Output 1820 ml  Net -20 ml   Filed Weights   11/06/19 1830 11/12/19 0600  Weight: (!) 184.6 kg (!) 208.7 kg    Examination: Flexi-Seal in place with loose stools General exam: Appears calm and comfortable  Respiratory system: Clear to auscultation. Respiratory effort normal. Cardiovascular system: S1 & S2 heard, RRR. No JVD, murmurs, rubs, gallops or clicks. No pedal edema. Gastrointestinal system: Abdomen is distended, soft and nontender. No organomegaly or masses felt. Normal bowel sounds heard. Central nervous system: Alert and oriented. No focal neurological deficits. Extremities: Symmetric 5 x 5 power. Skin: No rashes, lesions or ulcers Psychiatry: Judgement and insight appear normal. Mood & affect appropriate.     Data Reviewed: I have personally reviewed following labs and imaging studies  CBC: Recent Labs  Lab 11/07/19 0459 11/07/19 0459 11/08/19 0310 11/08/19 0310 11/09/19 0831 11/09/19 0831 11/10/19 0410 11/11/19 0041 11/11/19 0220 11/11/19 1159 11/12/19 0255 11/12/19 1241 11/12/19 1903 11/13/19 0257 11/13/19 1156  WBC 6.1  --  7.1  --  6.7  --  8.1  --  8.8  --   --   --   --   --   --   HGB 8.9*   < > 8.4*   < > 8.3*   < > 8.1*   < > 5.6*   < > 11.4* 10.4* 9.3* 8.8* 8.2*  HCT 29.6*   < > 28.1*   < > 28.1*   < > 27.4*   < > 18.7*   < > 34.8* 30.6* 27.5* 26.5* 25.2*  MCV 84.1  --  84.4  --  84.4  --  84.3  --  84.6  --   --   --   --   --   --   PLT 651*  --  665*  --  608*  --  612*  --  453*  --   --   --   --   --   --    < > = values in this interval not displayed.   Basic Metabolic Panel: Recent Labs  Lab 11/09/19 0831 11/10/19 0410 11/11/19 0220 11/12/19 0255 11/13/19 0257  NA 140  143 143 147* 140  K 4.6 4.4 4.6 4.3 4.2  CL 106 108 113* 116* 116*  CO2 22 22 21* 19* 18*  GLUCOSE 82 105* 129* 119* 145*  BUN 61* 61* 70* 82* 77*  CREATININE 6.86* 6.64* 5.99* 5.27* 4.27*  CALCIUM 9.4 8.9 7.9* 8.4* 7.7*  PHOS 8.4* 7.8* 6.6* 5.6* 4.2   GFR: Estimated Creatinine Clearance: 23.2 mL/min (A) (by C-G formula based on SCr of 4.27 mg/dL (H)). Liver Function Tests: Recent Labs  Lab 11/06/19 1751 11/06/19 1751 11/07/19 0459 11/07/19 0459 11/08/19 0310 11/08/19 0310 11/09/19 0831 11/10/19 0410 11/11/19 0220 11/12/19 0255 11/13/19 0257  AST 14*  --  11*  --  12*  --   --   --   --   --   --   ALT 15  --  15  --  15  --   --   --   --   --   --   ALKPHOS 94  --  97  --  89  --   --   --   --   --   --   BILITOT 0.4  --  0.6  --  0.5  --   --   --   --   --   --   PROT 6.0*  --  6.3*  --  6.1*  --   --   --   --   --   --   ALBUMIN 2.7*   < > 2.8*   < > 2.7*   < > 2.6* 2.5* 2.0* 2.1* 2.0*   < > = values in this interval not displayed.   No results for input(s): LIPASE, AMYLASE in the last 168 hours. No results for input(s): AMMONIA in the last 168 hours. Coagulation Profile: No results for input(s): INR, PROTIME in the last 168 hours. Cardiac Enzymes: No results for input(s): CKTOTAL, CKMB, CKMBINDEX, TROPONINI in the last 168 hours. BNP (last 3 results) No results for input(s): PROBNP in the last 8760 hours. HbA1C: No results for input(s): HGBA1C in the last 72 hours. CBG: Recent Labs  Lab 11/12/19 1128 11/12/19 1614 11/12/19 2023 11/13/19 0731 11/13/19 1115  GLUCAP 115* 117* 113* 127* 139*   Lipid Profile: No results for input(s): CHOL, HDL, LDLCALC, TRIG, CHOLHDL, LDLDIRECT in the last 72 hours. Thyroid Function Tests: No results for input(s): TSH, T4TOTAL, FREET4, T3FREE, THYROIDAB in the last 72 hours. Anemia Panel: No results for input(s): VITAMINB12, FOLATE, FERRITIN, TIBC, IRON, RETICCTPCT in the last 72 hours. Sepsis Labs: No results for  input(s): PROCALCITON, LATICACIDVEN in the last 168 hours.  Recent Results (from the past 240 hour(s))  SARS Coronavirus 2 by RT PCR (hospital order, performed in [facility] hospital lab) Nasopharyngeal Nasopharyngeal Swab     Status: None   Collection Time: 11/05/19  9:48 PM   Specimen: Nasopharyngeal Swab  Result Value Ref Range Status   SARS Coronavirus 2 NEGATIVE NEGATIVE Final    Comment: (NOTE) SARS-CoV-2 target nucleic acids are NOT DETECTED.  The SARS-CoV-2 RNA is generally detectable in upper and lower respiratory specimens during the acute phase of infection. The lowest concentration of SARS-CoV-2 viral copies this assay can detect is 250 copies / mL. A negative result does not preclude SARS-CoV-2 infection and should not be used as the sole basis for treatment or other patient management decisions.  A negative result may occur with improper specimen collection / handling, submission of specimen other than nasopharyngeal swab, presence of viral mutation(s) within the areas targeted by this assay, and inadequate number of viral copies (<250 copies / mL). A negative result must be combined with clinical observations, patient history, and epidemiological information.  Fact Sheet for Patients:   StrictlyIdeas.no  Fact Sheet for Healthcare Providers: BankingDealers.co.za  This test is not yet approved or  cleared by the [place] FDA and has been authorized for detection and/or diagnosis of SARS-CoV-2 by FDA under an Emergency Use Authorization (EUA).  This  EUA will remain in effect (meaning this test can be used) for the duration of the COVID-19 declaration under Section 564(b)(1) of the Act, 21 U.S.C. section 360bbb-3(b)(1), unless the authorization is terminated or revoked sooner.  Performed at [facility], [place] [address], [place], [place] [address]   Culture, Urine     Status: Abnormal    Collection Time: 11/08/19  1:49 PM   Specimen: Urine, Catheterized  Result Value Ref Range Status   Specimen Description   Final    URINE, CATHETERIZED Performed at [place] [address], [place], [place] [address]    Special Requests   Final    NONE Performed at [facility], [place] [address], [place], [place] [address]    Culture MULTIPLE SPECIES PRESENT, SUGGEST RECOLLECTION (A)  Final   Report Status 11/09/2019 FINAL  Final  Culture, Urine     Status: Abnormal   Collection Time: 11/09/19 12:04 PM   Specimen: Urine, Random  Result Value Ref Range Status   Specimen Description   Final    URINE, RANDOM Performed at [place] [address] [place], [place] [address]    Special Requests   Final    NONE Performed at [facility], [place] [address] [place], [place] [address]    Culture (A)  Final    <10,000 COLONIES/mL INSIGNIFICANT GROWTH Performed at [place] [address] [place], [place] [address]    Report Status 11/10/2019 FINAL  Final         Radiology Studies: No results found.      Scheduled Meds: . atorvastatin  10 mg Oral Daily  . chlorhexidine  15 mL Mouth Rinse BID  . Chlorhexidine Gluconate Cloth  6 each Topical Daily  . feeding supplement (GLUCERNA SHAKE)  237 mL Oral BID BM  . furosemide  40 mg Intravenous Once  . insulin aspart  0-6 Units Subcutaneous TID WC  . mouth rinse  15 mL Mouth Rinse q12n4p  . pantoprazole (PROTONIX) IV  40 mg Intravenous Q12H   Continuous Infusions: . sodium chloride    . sodium chloride    . albumin human    . ferumoxytol 510 mg (11/08/19 1615)  . sodium chloride       LOS: 7 days     Georgette Shell, MD 11/13/2019, 12:57 PM

## 2019-11-13 NOTE — Transfer of Care (Signed)
Immediate Anesthesia Transfer of Care Note  Patient: [name] [name]  Procedure(s) Performed: COLONOSCOPY WITH PROPOFOL (N/A )  Patient Location: PACU and Endoscopy Unit  Anesthesia Type:MAC  Level of Consciousness: awake, alert  and oriented  Airway & Oxygen Therapy: Patient Spontanous Breathing  Post-op Assessment: Report given to RN and Post -op Vital signs reviewed and stable  Post vital signs: Reviewed and stable  Last Vitals:  Vitals Value Taken Time  BP    Temp    Pulse    Resp    SpO2      Last Pain:  Vitals:   11/13/19 1444  TempSrc: Axillary  PainSc: 0-No pain      Patients Stated Pain Goal: 0 (41/96/22 2979)  Complications: No complications documented.

## 2019-11-13 NOTE — Interval H&P Note (Signed)
History and Physical Interval Note:  11/13/2019 2:49 PM  [name] [name]  has presented today for surgery, with the diagnosis of Hematochezia.  The various methods of treatment have been discussed with the patient and family. After consideration of risks, benefits and other options for treatment, the patient has consented to  Procedure(s): COLONOSCOPY WITH PROPOFOL (N/A) as a surgical intervention.  The patient's history has been reviewed, patient examined, no change in status, stable for surgery.  I have reviewed the patient's chart and labs.  Questions were answered to the patient's satisfaction.     Juanita Craver

## 2019-11-14 ENCOUNTER — Encounter (HOSPITAL_COMMUNITY): Payer: Self-pay | Admitting: Gastroenterology

## 2019-11-14 LAB — BASIC METABOLIC PANEL
Anion gap: 10 (ref 5–15)
BUN: 66 mg/dL — ABNORMAL HIGH (ref 8–23)
CO2: 16 mmol/L — ABNORMAL LOW (ref 22–32)
Calcium: 7.7 mg/dL — ABNORMAL LOW (ref 8.9–10.3)
Chloride: 117 mmol/L — ABNORMAL HIGH (ref 98–111)
Creatinine, Ser: 3.77 mg/dL — ABNORMAL HIGH (ref 0.44–1.00)
GFR calc Af Amer: 14 mL/min — ABNORMAL LOW (ref >60)
GFR calc non Af Amer: 12 mL/min — ABNORMAL LOW (ref >60)
Glucose, Bld: 86 mg/dL (ref 70–99)
Potassium: 3.9 mmol/L (ref 3.5–5.1)
Sodium: 143 mmol/L (ref 135–145)

## 2019-11-14 LAB — GLUCOSE, CAPILLARY
Glucose-Capillary: 87 mg/dL (ref 70–99)
Glucose-Capillary: 82 mg/dL (ref 70–99)
Glucose-Capillary: 91 mg/dL (ref 70–99)
Glucose-Capillary: 91 mg/dL (ref 70–99)

## 2019-11-14 LAB — MRSA PCR SCREENING: MRSA by PCR: NEGATIVE

## 2019-11-14 LAB — HEMOGLOBIN AND HEMATOCRIT, BLOOD
HCT: 24.1 % — ABNORMAL LOW (ref 36.0–46.0)
HCT: 21.7 % — ABNORMAL LOW (ref 36.0–46.0)
HCT: 26.4 % — ABNORMAL LOW (ref 36.0–46.0)
Hemoglobin: 7.9 g/dL — ABNORMAL LOW (ref 12.0–15.0)
Hemoglobin: 7.1 g/dL — ABNORMAL LOW (ref 12.0–15.0)
Hemoglobin: 8.5 g/dL — ABNORMAL LOW (ref 12.0–15.0)

## 2019-11-14 LAB — PREPARE RBC (CROSSMATCH)

## 2019-11-14 MED ORDER — SODIUM CHLORIDE 0.9% IV SOLUTION: Freq: Once | INTRAVENOUS | Status: AC

## 2019-11-14 MED ORDER — FUROSEMIDE 10 MG/ML IJ SOLN: 20.0000 mg | Freq: Two times a day (BID) | INTRAMUSCULAR | Status: DC

## 2019-11-14 MED ORDER — FUROSEMIDE 10 MG/ML IJ SOLN
40.0000 mg | Freq: Every day | INTRAMUSCULAR | Status: DC
  Filled 2019-11-14: qty 4

## 2019-11-14 MED ORDER — FUROSEMIDE 10 MG/ML IJ SOLN
20.0000 mg | Freq: Two times a day (BID) | INTRAMUSCULAR | Status: DC
  Administered 2019-11-14 – 2019-11-15 (×3): 20 mg via INTRAVENOUS
  Filled 2019-11-14 (×3): qty 2

## 2019-11-14 NOTE — Progress Notes (Signed)
Nephrology Follow-Up Consult note   Summary: [name] [name] is a/an 64 y.o. female with a past medical history significant for DM 2, morbid obesity, HTN, neuropathy, CKD, admitted for altered mental status.    Problems: Non-Oliguric AKI on CKD: b/l creat 1.5- 1.8, eGFR 33- 38 ml/min. ATN due to diuretics, hypotension, UTI and poor po intake all contributing. No signs of obstruction on CT scan.  UA with pyuria but also numerous bacteria.  Admit creat 6.2 on 9/14, peaked at 7.15 on 9/17, now dropping 6.4 > 5.9 > 5.27 > 4.2 > 3.7 today. - BP's labile, will give lasix small dose IV 20 bid if BP > 95, to help w/ anasarca - will follow  Volume - sig leg edema, wt's are ^^ 30- 50 lbs  GI bleed - acute, low BP's have resolved. Per pmd/ GI.   Morbid obesity: Likely has contributed to CKD over time  Diabetes mellitus type 2: Management per primary.  Anemia: Hemoglobin 8.3.  Iron sat 7 and ferritin 9.  Status post Feraheme 1 dose repeat dose in 1 week.    Diarrhea: Being treated with Imodium by primary team.  Continue work-up per them.   [facility] 11/14/2019 4:33 PM  _________________________________________________________ Interval History/Subjective: Doing good today.  Creat down again to 5.2.   Constitutional: Morbidly obese, no distress, lying flat, in no distress ENMT: ears and nose without scars or lesions, MMM CV: normal rate, 2+ diffuse bilat leg edema Respiratory: Lying flat, bilateral chest rise, normal work of breathing Gastrointestinal: soft, non-tender, no palpable masses or hernias Skin: no visible lesions or rashes Psych: alert, judgement/insight appropriate, appropriate mood and affect Neuro: no asterixis present  Medications:  Current Facility-Administered Medications  Medication Dose Route Frequency Provider Last Rate Last Admin  . 0.9 %  sodium chloride infusion   Intra-arterial PRN Barb Merino, MD 10 mL/hr at 11/14/19 0054  Rate Change at 11/14/19 0054  . acetaminophen (TYLENOL) tablet 650 mg  650 mg Oral Q6H PRN Mujtaba, Mohammadtokir, MD   650 mg at 11/07/19 1343   Or  . acetaminophen (TYLENOL) suppository 650 mg  650 mg Rectal Q6H PRN Mujtaba, Mohammadtokir, MD      . albumin human 25 % solution 25 g  25 g Intravenous Q12H Roney Jaffe, MD 60 mL/hr at 11/14/19 0543 25 g at 11/14/19 0543  . albuterol (PROVENTIL) (2.5 MG/3ML) 0.083% nebulizer solution 2.5 mg  2.5 mg Nebulization Q2H PRN Mujtaba, Mohammadtokir, MD      . atorvastatin (LIPITOR) tablet 10 mg  10 mg Oral Daily Mujtaba, Mohammadtokir, MD   10 mg at 11/14/19 0945  . chlorhexidine (PERIDEX) 0.12 % solution 15 mL  15 mL Mouth Rinse BID Barb Merino, MD   15 mL at 11/14/19 0949  . Chlorhexidine Gluconate Cloth 2 % PADS 6 each  6 each Topical Daily Annita Brod, MD   6 each at 11/13/19 1031  . feeding supplement (GLUCERNA SHAKE) (GLUCERNA SHAKE) liquid 237 mL  237 mL Oral BID BM Barb Merino, MD   237 mL at 11/11/19 1347  . ferumoxytol (FERAHEME) 510 mg in sodium chloride 0.9 % 100 mL IVPB  510 mg Intravenous Weekly Reesa Chew, MD 468 mL/hr at 11/08/19 1615 510 mg at 11/08/19 1615  . furosemide (LASIX) injection 40 mg  40 mg Intravenous Daily Roney Jaffe, MD      . haloperidol lactate (HALDOL) injection 2 mg  2 mg Intravenous Q6H PRN Lang Snow, FNP  2 mg at 11/11/19 2139  . HYDROcodone-acetaminophen (NORCO/VICODIN) 5-325 MG per tablet 1 tablet  1 tablet Oral Q8H PRN Annita Brod, MD   1 tablet at 11/11/19 2106  . hydrOXYzine (ATARAX/VISTARIL) tablet 50 mg  50 mg Oral TID PRN Barb Merino, MD   50 mg at 11/11/19 2106  . insulin aspart (novoLOG) injection 0-6 Units  0-6 Units Subcutaneous TID WC Ghimire, Dante Gang, MD      . lip balm (CARMEX) ointment   Topical PRN Barb Merino, MD      . loperamide (IMODIUM) capsule 2 mg  2 mg Oral Q6H PRN Barb Merino, MD   2 mg at 11/10/19 1325  . MEDLINE mouth rinse  15 mL Mouth Rinse  q12n4p Barb Merino, MD   15 mL at 11/12/19 1630  . ondansetron (ZOFRAN) injection 4 mg  4 mg Intravenous Q6H PRN Annita Brod, MD   4 mg at 11/10/19 1621  . pantoprazole (PROTONIX) injection 40 mg  40 mg Intravenous Q12H Barb Merino, MD   40 mg at 11/14/19 0943  . sodium chloride flush (NS) 0.9 % injection 10-40 mL  10-40 mL Intracatheter PRN Annita Brod, MD          Review of Systems: 10 systems reviewed and negative except per interval history/subjective  Physical Exam: Vitals:   11/14/19 1500 11/14/19 1600  BP: (!) 112/38 (!) 111/38  Pulse:    Resp: 20 (!) 26  Temp:    SpO2:     Total I/O In: 30 [Blood:30] Out: -   Intake/Output Summary (Last 24 hours) at 11/14/2019 1633 Last data filed at 11/14/2019 1430 Gross per 24 hour  Intake 39 ml  Output 2220 ml  Net -2181 ml      Test Results I personally reviewed new and old clinical labs and radiology tests Lab Results  Component Value Date   NA 143 11/14/2019   K 3.9 11/14/2019   CL 117 (H) 11/14/2019   CO2 16 (L) 11/14/2019   BUN 66 (H) 11/14/2019   CREATININE 3.77 (H) 11/14/2019   CALCIUM 7.7 (L) 11/14/2019   ALBUMIN 2.0 (L) 11/13/2019   PHOS 4.2 11/13/2019

## 2019-11-14 NOTE — Progress Notes (Signed)
PT Cancellation Note  Patient Details Name: [name] [name] MRN: [id number] DOB: February 10, 1956   Cancelled Treatment:     PT deferred this date - pt transfusing.  Will follow.   Taquilla Downum 11/14/2019, 3:42 PM

## 2019-11-14 NOTE — Progress Notes (Signed)
OT Cancellation Note  Patient Details Name: [name] [name] MRN: [id number] DOB: 09-09-1955   Cancelled Treatment:    Reason Eval/Treat Not Completed: Patient not medically ready. Patient receiving blood transfusion. Will hold for now and follow up as able.  Lenward Chancellor 11/14/2019, 3:38 PM

## 2019-11-14 NOTE — Progress Notes (Signed)
Subjective: No complaints.  Objective: Vital signs in last 24 hours: Temp:  [97.6 F (36.4 C)-98.8 F (37.1 C)] 97.7 F (36.5 C) (09/23 1430) Pulse Rate:  [80-103] 89 (09/23 1430) Resp:  [14-29] 18 (09/23 1430) BP: (79-112)/(26-78) 99/37 (09/23 1430) SpO2:  [93 %-100 %] 100 % (09/23 1430) Arterial Line BP: (91-112)/(61-93) 96/78 (09/23 0200) Last BM Date: 11/13/19  Intake/Output from previous day: 09/22 0701 - 09/23 0700 In: 409 [I.V.:400; IV Piggyback:9] Out: 2220 [Urine:2220] Intake/Output this shift: Total I/O In: 30 [Blood:30] Out: -   General appearance: alert and no distress Resp: clear to auscultation bilaterally Cardio: regular rate and rhythm GI: soft, non-tender; bowel sounds normal; no masses,  no organomegaly and morbidly obese Extremities: extremities normal, atraumatic, no cyanosis or edema  Lab Results: Recent Labs    11/13/19 1823 11/14/19 0240 11/14/19 1030  HGB 8.5* 7.9* 7.1*  HCT 25.9* 24.1* 21.7*   BMET Recent Labs    11/12/19 0255 11/13/19 0257 11/14/19 0240  NA 147* 140 143  K 4.3 4.2 3.9  CL 116* 116* 117*  CO2 19* 18* 16*  GLUCOSE 119* 145* 86  BUN 82* 77* 66*  CREATININE 5.27* 4.27* 3.77*  CALCIUM 8.4* 7.7* 7.7*   LFT Recent Labs    11/13/19 0257  ALBUMIN 2.0*   PT/INR No results for input(s): LABPROT, INR in the last 72 hours. Hepatitis Panel No results for input(s): HEPBSAG, HCVAB, HEPAIGM, HEPBIGM in the last 72 hours. C-Diff No results for input(s): CDIFFTOX in the last 72 hours. Fecal Lactopherrin No results for input(s): FECLLACTOFRN in the last 72 hours.  Studies/Results: No results found.  Medications:  Scheduled: . atorvastatin  10 mg Oral Daily  . chlorhexidine  15 mL Mouth Rinse BID  . Chlorhexidine Gluconate Cloth  6 each Topical Daily  . feeding supplement (GLUCERNA SHAKE)  237 mL Oral BID BM  . furosemide  40 mg Intravenous Daily  . insulin aspart  0-6 Units Subcutaneous TID WC  . mouth rinse  15  mL Mouth Rinse q12n4p  . pantoprazole (PROTONIX) IV  40 mg Intravenous Q12H   Continuous: . sodium chloride 10 mL/hr at 11/14/19 0054  . albumin human 25 g (11/14/19 0543)  . ferumoxytol 510 mg (11/08/19 1615)    Assessment/Plan: 1) GI bleed - ? Source. 2) Diverticula. 3) Anemia.   The patient is stable, but she continues to require blood transfusions.  There is a slow drifting of her HGB and she is receiving another unit of PRBC.  The colonoscopy exhibited blood, but the TI was not able to be intubated.  An upper GI source needs to be excluded.  Plan: 1) EGD tomorrow.  LOS: 8 days   Damon Hargrove D 11/14/2019, 2:59 PM

## 2019-11-14 NOTE — Progress Notes (Signed)
PROGRESS NOTE    [name] [name]  YOV:[id number] DOB: 1955-10-19 DOA: 11/05/2019 PCP: [name], MD    Brief Narrative:64 year old female with super morbid obesity, hypertension, type 2 diabetes, stage IV chronic kidney disease with baseline creatinine about 1.7, recent left leg injury and fracture recovering at the skilled nursing facility brought to the ER on 9/14 for confusion.  Patient was reportedly unresponsive to verbal stimuli and subsequently was incoherent.  In the emergency room, she improved spontaneously and responded to questions appropriately.  Patient is on multiple medications including narcotics and benzodiazepine.  Admitted with mild acute metabolic encephalopathy and acute renal failure. Patient continued to have diarrhea  9/19 evening: Patient started having blood mixed with stool and then frank bleeding per rectum with drop in hemoglobin. 9/20: Severe lower GI bleed with multiple fresh bleed per rectum, hypotensive and hemoglobin 5.6 with baseline hemoglobin 11. Resuscitation, 6 units of PRBC, multiple isotonic fluids with stabilization. 11/14/2019 colonoscopy few scattered diverticuli in the ascending colon examination otherwise normal.  Assessment & Plan:   Principal Problem:   Acute renal failure (HCC) Active Problems:   Type 2 diabetes mellitus with stage 4 chronic kidney disease (HCC)   Metabolic encephalopathy   CKD (chronic kidney disease) stage 4, GFR 15-29 ml/min (HCC)   Pressure injury of skin   Morbid obesity (HCC)   GI bleeding   Encephalopathy acute   Vaginal discharge   Anemia associated with acute blood loss     #1 AKI with CKD stage IV this is thought to be due to multifactorial causes including hypotension multiple medications including chlorthalidone.  It appears her creatinine is improving after Lasix challenge.  Nephrology following appreciate.  #2  Acute on chronic anemia secondary to lower GI bleed she received 6 units of  packed RBC.  Patient  had colonoscopy 9/23 no evidence of active bleeding was noted. Hemoglobin today 7.9 down from 8.5 yesterday.Marland Kitchen  Her baseline hemoglobin is around 11.  She was also given a unit of Feraheme. We will monitor CBC closely.  #3 acute metabolic encephalopathy seems to be improving this is thought to be likely related to polypharmacy.  Prior to admission patient was on Percocet hydroxyzine Cymbalta Neurontin and Xanax at the nursing home.  These medications have been on hold mostly except for low-dose Percocet and intermittent Atarax that have been restarted.  She is awake and alert today and answers all my questions appropriately.  #4 type 2 diabetes with hypoglycemic episodes on SSI. CBG (last 3)  Recent Labs    11/13/19 1115 11/13/19 1640 11/14/19 0745  GLUCAP 139* 93 87    #5 status post UTI treated with Rocephin for 3 days.   Estimated body mass index is 83.67 kg/m as calculated from the following:   Height as of this encounter: 5\' 2"  (1.575 m).   Weight as of this encounter: 207.5 kg.  DVT prophylaxis: SCD code Status: Full code Family Communication: None at bedside Disposition Plan:  Status is: Inpatient  Dispo:  Patient From: [facility]  Planned Disposition: [place]  Expected discharge date: Unknown  medically stable for discharge: No still being monitored for GI bleed and AKI with daily labs  Consultants: GI nephrology   Procedures: A-line, subclavian line Antimicrobials: Anti-infectives (From admission, onward)   Start     Dose/Rate Route Frequency Ordered Stop   11/06/19 0600  metroNIDAZOLE (FLAGYL) IVPB 500 mg  Status:  Discontinued        500 mg  100 mL/hr over 60 Minutes Intravenous Every 8 hours 11/06/19 0417 11/11/19 1012   11/06/19 0500  cefTRIAXone (ROCEPHIN) 1 g in sodium chloride 0.9 % 100 mL IVPB  Status:  Discontinued        1 g 200 mL/hr over 30 Minutes Intravenous Every 24 hours 11/06/19 0417 11/09/19  1029       Subjective:  She is awake and alert resting in bed she reports she has not walked since she had fracture of the left lower extremity 3 months ago  Objective: Vitals:   11/14/19 0400 11/14/19 0500 11/14/19 0600 11/14/19 0800  BP: (!) 91/34 (!) 87/26 (!) 94/36   Pulse: 93 91 88   Resp: (!) 21 18 19    Temp: 98.8 F (37.1 C)   98.3 F (36.8 C)  TempSrc: Axillary   Oral  SpO2: 100% 96% 93%   Weight:      Height:        Intake/Output Summary (Last 24 hours) at 11/14/2019 5361 Last data filed at 11/14/2019 0400 Gross per 24 hour  Intake 9 ml  Output 2220 ml  Net -2211 ml   Filed Weights   11/06/19 1830 11/12/19 0600 11/13/19 1455  Weight: (!) 184.6 kg (!) 208.7 kg (!) 207.5 kg    Examination: Flexi-Seal in place with loose stools General exam: Appears calm and comfortable  Respiratory system: Clear to auscultation. Respiratory effort normal. Cardiovascular system: S1 & S2 heard, RRR. No JVD, murmurs, rubs, gallops or clicks. No pedal edema. Gastrointestinal system: Abdomen is distended, soft and nontender. No organomegaly or masses felt. Normal bowel sounds heard. Central nervous system: Alert and oriented. No focal neurological deficits. Extremities: 2+ pitting edema Skin: No rashes, lesions or ulcers Psychiatry: Judgement and insight appear normal. Mood & affect appropriate.     Data Reviewed: I have personally reviewed following labs and imaging studies  CBC: Recent Labs  Lab 11/08/19 0310 11/08/19 0310 11/09/19 0831 11/09/19 0831 11/10/19 0410 11/11/19 0041 11/11/19 0220 11/11/19 1159 11/12/19 1903 11/13/19 0257 11/13/19 1156 11/13/19 1823 11/14/19 0240  WBC 7.1  --  6.7  --  8.1  --  8.8  --   --   --   --   --   --   HGB 8.4*   < > 8.3*   < > 8.1*   < > 5.6*   < > 9.3* 8.8* 8.2* 8.5* 7.9*  HCT 28.1*   < > 28.1*   < > 27.4*   < > 18.7*   < > 27.5* 26.5* 25.2* 25.9* 24.1*  MCV 84.4  --  84.4  --  84.3  --  84.6  --   --   --   --   --   --    PLT 665*  --  608*  --  612*  --  453*  --   --   --   --   --   --    < > = values in this interval not displayed.   Basic Metabolic Panel: Recent Labs  Lab 11/09/19 0831 11/09/19 0831 11/10/19 0410 11/11/19 0220 11/12/19 0255 11/13/19 0257 11/14/19 0240  NA 140   < > 143 143 147* 140 143  K 4.6   < > 4.4 4.6 4.3 4.2 3.9  CL 106   < > 108 113* 116* 116* 117*  CO2 22   < > 22 21* 19* 18* 16*  GLUCOSE 82   < > 105* 129* 119*  145* 86  BUN 61*   < > 61* 70* 82* 77* 66*  CREATININE 6.86*   < > 6.64* 5.99* 5.27* 4.27* 3.77*  CALCIUM 9.4   < > 8.9 7.9* 8.4* 7.7* 7.7*  PHOS 8.4*  --  7.8* 6.6* 5.6* 4.2  --    < > = values in this interval not displayed.   GFR: Estimated Creatinine Clearance: 26.2 mL/min (A) (by C-G formula based on SCr of 3.77 mg/dL (H)). Liver Function Tests: Recent Labs  Lab 11/08/19 0310 11/08/19 0310 11/09/19 0831 11/10/19 0410 11/11/19 0220 11/12/19 0255 11/13/19 0257  AST 12*  --   --   --   --   --   --   ALT 15  --   --   --   --   --   --   ALKPHOS 89  --   --   --   --   --   --   BILITOT 0.5  --   --   --   --   --   --   PROT 6.1*  --   --   --   --   --   --   ALBUMIN 2.7*   < > 2.6* 2.5* 2.0* 2.1* 2.0*   < > = values in this interval not displayed.   No results for input(s): LIPASE, AMYLASE in the last 168 hours. No results for input(s): AMMONIA in the last 168 hours. Coagulation Profile: No results for input(s): INR, PROTIME in the last 168 hours. Cardiac Enzymes: No results for input(s): CKTOTAL, CKMB, CKMBINDEX, TROPONINI in the last 168 hours. BNP (last 3 results) No results for input(s): PROBNP in the last 8760 hours. HbA1C: No results for input(s): HGBA1C in the last 72 hours. CBG: Recent Labs  Lab 11/12/19 2023 11/13/19 0731 11/13/19 1115 11/13/19 1640 11/14/19 0745  GLUCAP 113* 127* 139* 93 87   Lipid Profile: No results for input(s): CHOL, HDL, LDLCALC, TRIG, CHOLHDL, LDLDIRECT in the last 72 hours. Thyroid Function  Tests: No results for input(s): TSH, T4TOTAL, FREET4, T3FREE, THYROIDAB in the last 72 hours. Anemia Panel: No results for input(s): VITAMINB12, FOLATE, FERRITIN, TIBC, IRON, RETICCTPCT in the last 72 hours. Sepsis Labs: No results for input(s): PROCALCITON, LATICACIDVEN in the last 168 hours.  Recent Results (from the past 240 hour(s))  SARS Coronavirus 2 by RT PCR (hospital order, performed in [facility] hospital lab) Nasopharyngeal Nasopharyngeal Swab     Status: None   Collection Time: 11/05/19  9:48 PM   Specimen: Nasopharyngeal Swab  Result Value Ref Range Status   SARS Coronavirus 2 NEGATIVE NEGATIVE Final    Comment: (NOTE) SARS-CoV-2 target nucleic acids are NOT DETECTED.  The SARS-CoV-2 RNA is generally detectable in upper and lower respiratory specimens during the acute phase of infection. The lowest concentration of SARS-CoV-2 viral copies this assay can detect is 250 copies / mL. A negative result does not preclude SARS-CoV-2 infection and should not be used as the sole basis for treatment or other patient management decisions.  A negative result may occur with improper specimen collection / handling, submission of specimen other than nasopharyngeal swab, presence of viral mutation(s) within the areas targeted by this assay, and inadequate number of viral copies (<250 copies / mL). A negative result must be combined with clinical observations, patient history, and epidemiological information.  Fact Sheet for Patients:   StrictlyIdeas.no  Fact Sheet for Healthcare Providers: BankingDealers.co.za  This test is not yet  approved or  cleared by the [place] and has been authorized for detection and/or diagnosis of SARS-CoV-2 by FDA under an Emergency Use Authorization (EUA).  This EUA will remain in effect (meaning this test can be used) for the duration of the COVID-19 declaration under Section 564(b)(1) of the  Act, 21 U.S.C. section 360bbb-3(b)(1), unless the authorization is terminated or revoked sooner.  Performed at [facility], [place] [address], [place], [place] [address]   Culture, Urine     Status: Abnormal   Collection Time: 11/08/19  1:49 PM   Specimen: Urine, Catheterized  Result Value Ref Range Status   Specimen Description   Final    URINE, CATHETERIZED Performed at [place] [address], [place], [place] [address]    Special Requests   Final    NONE Performed at [facility], [place] [address] [place], [place] [address]    Culture MULTIPLE SPECIES PRESENT, SUGGEST RECOLLECTION (A)  Final   Report Status 11/09/2019 FINAL  Final  Culture, Urine     Status: Abnormal   Collection Time: 11/09/19 12:04 PM   Specimen: Urine, Random  Result Value Ref Range Status   Specimen Description   Final    URINE, RANDOM Performed at [place] [address] [place], [place] [address]    Special Requests   Final    NONE Performed at [facility], [place] [address] [place], [place] [address]    Culture (A)  Final    <10,000 COLONIES/mL INSIGNIFICANT GROWTH Performed at [place] [address] [place], [place] [address]    Report Status 11/10/2019 FINAL  Final         Radiology Studies: No results found.      Scheduled Meds: . atorvastatin  10 mg Oral Daily  . chlorhexidine  15 mL Mouth Rinse BID  . Chlorhexidine Gluconate Cloth  6 each Topical Daily  . feeding supplement (GLUCERNA SHAKE)  237 mL Oral BID BM  . furosemide  40 mg Intravenous Daily  . insulin aspart  0-6 Units Subcutaneous TID WC  . mouth rinse  15 mL Mouth Rinse q12n4p  . pantoprazole (PROTONIX) IV  40 mg Intravenous Q12H   Continuous Infusions: . sodium chloride 10 mL/hr at 11/14/19 0054  . albumin human 25 g (11/14/19 0543)  . ferumoxytol 510 mg (11/08/19 1615)     LOS: 8 days      Georgette Shell, MD 11/14/2019, 9:38 AM

## 2019-11-15 ENCOUNTER — Inpatient Hospital Stay (HOSPITAL_COMMUNITY): Payer: No Typology Code available for payment source | Admitting: Certified Registered Nurse Anesthetist

## 2019-11-15 ENCOUNTER — Encounter (HOSPITAL_COMMUNITY)
Admission: EM | Disposition: A | Discharge status: Skilled Nursing Facility | Payer: Self-pay | Source: Skilled Nursing Facility | Attending: Internal Medicine

## 2019-11-15 ENCOUNTER — Encounter (HOSPITAL_COMMUNITY): Payer: Self-pay | Admitting: Internal Medicine

## 2019-11-15 HISTORY — PX: ESOPHAGOGASTRODUODENOSCOPY (EGD) WITH PROPOFOL: SHX5813

## 2019-11-15 HISTORY — PX: SCLEROTHERAPY: SHX6841

## 2019-11-15 HISTORY — PX: HEMOSTASIS CONTROL: SHX6838

## 2019-11-15 LAB — TYPE AND SCREEN
ABO/RH(D): B POS
Antibody Screen: NEGATIVE
Unit division: 0
Unit division: 0
Unit division: 0
Unit division: 0
Unit division: 0
Unit division: 0
Unit division: 0
Unit division: 0

## 2019-11-15 LAB — BPAM RBC
Blood Product Expiration Date: 202110082359
Blood Product Expiration Date: 202110202359
Blood Product Expiration Date: 202110202359
Blood Product Expiration Date: 202110222359
Blood Product Expiration Date: 202110232359
Blood Product Expiration Date: 202110232359
Blood Product Expiration Date: 202110232359
Blood Product Expiration Date: 202110232359
ISSUE DATE / TIME: 202109200356
ISSUE DATE / TIME: 202109200640
ISSUE DATE / TIME: 202109201527
ISSUE DATE / TIME: 202109201527
ISSUE DATE / TIME: 202109202159
ISSUE DATE / TIME: 202109210016
ISSUE DATE / TIME: 202109231409
Unit Type and Rh: 7300
Unit Type and Rh: 7300
Unit Type and Rh: 7300
Unit Type and Rh: 7300
Unit Type and Rh: 7300
Unit Type and Rh: 7300
Unit Type and Rh: 7300
Unit Type and Rh: 7300

## 2019-11-15 LAB — BASIC METABOLIC PANEL
Anion gap: 8 (ref 5–15)
BUN: 62 mg/dL — ABNORMAL HIGH (ref 8–23)
CO2: 21 mmol/L — ABNORMAL LOW (ref 22–32)
Calcium: 8.3 mg/dL — ABNORMAL LOW (ref 8.9–10.3)
Chloride: 115 mmol/L — ABNORMAL HIGH (ref 98–111)
Creatinine, Ser: 3.49 mg/dL — ABNORMAL HIGH (ref 0.44–1.00)
GFR calc Af Amer: 15 mL/min — ABNORMAL LOW (ref >60)
GFR calc non Af Amer: 13 mL/min — ABNORMAL LOW (ref >60)
Glucose, Bld: 87 mg/dL (ref 70–99)
Potassium: 4 mmol/L (ref 3.5–5.1)
Sodium: 144 mmol/L (ref 135–145)

## 2019-11-15 LAB — HEMOGLOBIN AND HEMATOCRIT, BLOOD
HCT: 25.2 % — ABNORMAL LOW (ref 36.0–46.0)
Hemoglobin: 8.3 g/dL — ABNORMAL LOW (ref 12.0–15.0)

## 2019-11-15 LAB — GLUCOSE, CAPILLARY
Glucose-Capillary: 80 mg/dL (ref 70–99)
Glucose-Capillary: 78 mg/dL (ref 70–99)
Glucose-Capillary: 114 mg/dL — ABNORMAL HIGH (ref 70–99)
Glucose-Capillary: 93 mg/dL (ref 70–99)

## 2019-11-15 SURGERY — ESOPHAGOGASTRODUODENOSCOPY (EGD) WITH PROPOFOL
Anesthesia: Monitor Anesthesia Care

## 2019-11-15 MED ORDER — LACTATED RINGERS IV SOLN: INTRAVENOUS | Status: DC

## 2019-11-15 MED ORDER — PROPOFOL 10 MG/ML IV BOLUS
INTRAVENOUS | Status: AC
  Filled 2019-11-15: qty 40

## 2019-11-15 MED ORDER — EPINEPHRINE 1 MG/10ML IJ SOSY
PREFILLED_SYRINGE | INTRAMUSCULAR | Status: AC
  Filled 2019-11-15: qty 10

## 2019-11-15 MED ORDER — SODIUM CHLORIDE (PF) 0.9 % IJ SOLN
PREFILLED_SYRINGE | INTRAMUSCULAR | Status: DC | PRN
  Administered 2019-11-15: 2 mL

## 2019-11-15 MED ORDER — PROPOFOL 500 MG/50ML IV EMUL
INTRAVENOUS | Status: AC
  Filled 2019-11-15: qty 50

## 2019-11-15 MED ORDER — PROPOFOL 500 MG/50ML IV EMUL
INTRAVENOUS | Status: DC | PRN
  Administered 2019-11-15: 55 ug/kg/min via INTRAVENOUS
  Administered 2019-11-15: 20 mg via INTRAVENOUS

## 2019-11-15 MED ORDER — LIDOCAINE HCL (CARDIAC) PF 100 MG/5ML IV SOSY
PREFILLED_SYRINGE | INTRAVENOUS | Status: DC | PRN
  Administered 2019-11-15: 40 mg via INTRAVENOUS

## 2019-11-15 MED ORDER — DEXTROSE 50 % IV SOLN
INTRAVENOUS | Status: AC
  Filled 2019-11-15: qty 50

## 2019-11-15 MED ORDER — ONDANSETRON HCL 4 MG/2ML IJ SOLN
INTRAMUSCULAR | Status: DC | PRN
  Administered 2019-11-15: 4 mg via INTRAVENOUS

## 2019-11-15 MED ORDER — DEXTROSE 50 % IV SOLN
25.0000 mL | Freq: Once | INTRAVENOUS | Status: AC
  Administered 2019-11-15: 25 mL via INTRAVENOUS

## 2019-11-15 SURGICAL SUPPLY — 15 items

## 2019-11-15 NOTE — Progress Notes (Signed)
Occupational Therapy Treatment Patient Details Name: [name] [name] MRN: [id number] DOB: 03/04/1955 Today's Date: 11/15/2019    History of present illness Patient is 64 y.o. female with PMH significant for HTN, DM, neuropathy, Lt knee fracture, CKD, obesity. Pt admitted from SNF for AMS. Per prior hospital admission  ([facility] home care called and reported unsafe living conditions as caregiver unable to assist pt).  Patient had recent hospital admission on 10/18/2019-10/20/2019 for possible GI bleed.  She was found to have severe constipation and not a GI bleed.  We also found that she had a left knee fracture, unknown when the fracture occurred sometime in August. Pt NWB on Lt LE last admission from 9/1-9/7.   OT comments  Treatment focused on improving patient's functional mobility and activity tolerance. Patient required +3 assistance to perform bed mobility and modified sit ups with HOB up. Patient provided with encouragement and education in regards to therapeutic process. Patient appears not have insight into deficits stating she's going to walk in the hall then but also stifling attempts at other attempts at movement. Patient also exhibits tangential conversation with cues required to keep her on task. Continue to recommend to return to SNF.    Follow Up Recommendations  SNF    Equipment Recommendations  None recommended by OT    Recommendations for Other Services      Precautions / Restrictions Precautions Precautions: Fall Restrictions Weight Bearing Restrictions: Yes LLE Weight Bearing: Non weight bearing Other Position/Activity Restrictions: Per Dr. Reather Littler 9/5 note: NWB pillow splints. Bed to chair stabilizing left knee. Need To rexray in three weeks. Most likely patient will be wheel chair bound even with healing.       Mobility Bed Mobility Overal bed mobility: Needs Assistance Bed Mobility: Rolling;Sit to Supine;Sit to Sidelying Rolling: +2 for physical  assistance;Total assist;+2 for safety/equipment Sidelying to sit: Total assist;+2 for safety/equipment;+2 for physical assistance;HOB elevated   Sit to supine: Total assist;+2 for physical assistance;+2 for safety/equipment   General bed mobility comments: Attempted transfer into sitting at side of bed. Due to height of bed and air mattress and patient's specific height and weight - edge of bed sitting precarious. Returned to supine for safety.  Transfers                      Balance                                           ADL either performed or assessed with clinical judgement   ADL                                               Vision Patient Visual Report: No change from baseline     Perception     Praxis      Cognition Arousal/Alertness: Awake/alert Behavior During Therapy: WFL for tasks assessed/performed Overall Cognitive Status: Within Functional Limits for tasks assessed                                 General Comments: Patient pleasant and oriented. However, patient reports seeing a large rat last night. Wants to get up and walk down the hall. Appears  to not have any insight into her deficits. Patient's conversation tangential and had to be cued to stay on task.        Exercises Other Exercises Other Exercises: Required +3 assistance to performed modified sit ups in bed x 2. Exhibits functional UB strength but poor coor strength and endurance.   Shoulder Instructions       General Comments      Pertinent Vitals/ Pain       Pain Assessment: No/denies pain  Home Living                                          Prior Functioning/Environment              Frequency  Min 2X/week        Progress Toward Goals  OT Goals(current goals can now be found in the care plan section)  Progress towards OT goals: Progressing toward goals  Acute Rehab OT Goals Patient Stated  Goal: get up and move and walk again OT Goal Formulation: With patient Time For Goal Achievement: 11/24/19  Plan Discharge plan remains appropriate    Co-evaluation    PT/OT/SLP Co-Evaluation/Treatment: Yes Reason for Co-Treatment: To address functional/ADL transfers;For patient/therapist safety PT goals addressed during session: Mobility/safety with mobility OT goals addressed during session: Strengthening/ROM      AM-PAC OT "6 Clicks" Daily Activity     Outcome Measure   Help from another person eating meals?: A Little Help from another person taking care of personal grooming?: A Little Help from another person toileting, which includes using toliet, bedpan, or urinal?: Total Help from another person bathing (including washing, rinsing, drying)?: Total Help from another person to put on and taking off regular upper body clothing?: Total Help from another person to put on and taking off regular lower body clothing?: Total 6 Click Score: 10    End of Session    OT Visit Diagnosis: Muscle weakness (generalized) (M62.81);Other abnormalities of gait and mobility (R26.89);History of falling (Z91.81)   Activity Tolerance Patient tolerated treatment well   Patient Left in bed;with call bell/phone within reach   Nurse Communication Mobility status        Time: 4332-9518 OT Time Calculation (min): 28 min  Charges: OT General Charges $OT Visit: 1 Visit OT Treatments $Therapeutic Activity: 8-22 mins  Derl Barrow, OTR/L [place]  Office [phone] Pager: [place] 11/15/2019, 12:00 PM

## 2019-11-15 NOTE — Anesthesia Postprocedure Evaluation (Signed)
Anesthesia Post Note  Patient: RAYLINN KOSAR  Procedure(s) Performed: ESOPHAGOGASTRODUODENOSCOPY (EGD) WITH PROPOFOL (N/A ) SCLEROTHERAPY HEMOSTASIS CONTROL     Patient location during evaluation: PACU Anesthesia Type: MAC Level of consciousness: awake and alert Pain management: pain level controlled Vital Signs Assessment: post-procedure vital signs reviewed and stable Respiratory status: spontaneous breathing, nonlabored ventilation, respiratory function stable and patient connected to nasal cannula oxygen Cardiovascular status: stable and blood pressure returned to baseline Postop Assessment: no apparent nausea or vomiting Anesthetic complications: no   No complications documented.  Last Vitals:  Vitals:   11/15/19 1320 11/15/19 1330  BP: (!) 141/41 (!) 126/47  Pulse: 93 83  Resp: (!) 23 20  Temp:    SpO2: 99% 94%    Last Pain:  Vitals:   11/15/19 1330  TempSrc:   PainSc: 0-No pain                 Dasie Chancellor S

## 2019-11-15 NOTE — Anesthesia Preprocedure Evaluation (Signed)
Anesthesia Evaluation  Patient identified by MRN, date of birth, ID band Patient awake    Reviewed: Allergy & Precautions, NPO status , Patient's Chart, lab work & pertinent test results  Airway Mallampati: II  TM Distance: >3 FB Neck ROM: Limited    Dental no notable dental hx.    Pulmonary neg pulmonary ROS, former smoker,    Pulmonary exam normal breath sounds clear to auscultation + decreased breath sounds      Cardiovascular hypertension, Normal cardiovascular exam Rhythm:Regular Rate:Normal     Neuro/Psych negative neurological ROS  negative psych ROS   GI/Hepatic negative GI ROS, Neg liver ROS,   Endo/Other  diabetesMorbid obesity  Renal/GU ARFRenal disease  negative genitourinary   Musculoskeletal negative musculoskeletal ROS (+)   Abdominal (+) + obese,   Peds negative pediatric ROS (+)  Hematology  (+) anemia ,   Anesthesia Other Findings   Reproductive/Obstetrics negative OB ROS                             Anesthesia Physical Anesthesia Plan  ASA: IV  Anesthesia Plan: MAC   Post-op Pain Management:    Induction: Intravenous  PONV Risk Score and Plan: 0  Airway Management Planned: Simple Face Mask  Additional Equipment:   Intra-op Plan:   Post-operative Plan:   Informed Consent: I have reviewed the patients History and Physical, chart, labs and discussed the procedure including the risks, benefits and alternatives for the proposed anesthesia with the patient or authorized representative who has indicated his/her understanding and acceptance.     Dental advisory given  Plan Discussed with: CRNA and Surgeon  Anesthesia Plan Comments:         Anesthesia Quick Evaluation

## 2019-11-15 NOTE — Progress Notes (Signed)
Nephrology Follow-Up Consult note   Summary: [name] [name] is a/an 64 y.o. female with a past medical history significant for DM 2, morbid obesity, HTN, neuropathy, CKD, admitted for altered mental status.    Problems: Non-Oliguric AKI on CKD: b/l creat 1.5- 1.8, eGFR 33- 38 ml/min. ATN due to diuretics, hypotension, UTI and poor po intake.  No signs of obstruction on CT scan.  UA with pyuria but also numerous bacteria.  Admit creat was 6.2 on 9/14, creat peaked at 7.15 on 9/17, now dropping 6.4 > 5.9 > 5.27 > 4.2 > 3.7 > 3.4 today. - BP's labile, will continue lasix IV 20 bid if BP > 95, to help w/ anasarca - will follow  Volume - sig leg edema remains, wt's coming down  GI bleed - acute, bleeding GU on EGD, per GI. Low BP's have resolved. Per pmd/ GI.   Morbid obesity: Likely has contributed to CKD over time  Diabetes mellitus type 2: Management per primary.  Anemia: Hemoglobin 8.3.  Iron sat 7 and ferritin 9.  Status post Feraheme 1 dose repeat dose in 1 week.    Diarrhea: Being treated with Imodium by primary team.  Continue work-up per them.   [facility] 11/15/2019 3:32 PM  _________________________________________________________ Interval History/Subjective: Doing good today.  Creat down 3.4. 1.7 L uop yesterday.   Constitutional: Morbidly obese, no distress, lying flat, in no distress ENMT: ears and nose without scars or lesions, MMM CV: normal rate, 2+ diffuse bilat leg edema Respiratory: Lying flat, bilateral chest rise, normal work of breathing Gastrointestinal: soft, non-tender, no palpable masses or hernias Skin: no visible lesions or rashes Psych: alert, judgement/insight appropriate, appropriate mood and affect Neuro: no asterixis present  Medications:  Current Facility-Administered Medications  Medication Dose Route Frequency Provider Last Rate Last Admin  . 0.9 %  sodium chloride infusion   Intra-arterial PRN Carol Ada, MD 10 mL/hr at 11/14/19 0054 Rate Change at 11/14/19 0054  . acetaminophen (TYLENOL) tablet 650 mg  650 mg Oral Q6H PRN Carol Ada, MD   650 mg at 11/07/19 1343   Or  . acetaminophen (TYLENOL) suppository 650 mg  650 mg Rectal Q6H PRN Carol Ada, MD      . albuterol (PROVENTIL) (2.5 MG/3ML) 0.083% nebulizer solution 2.5 mg  2.5 mg Nebulization Q2H PRN Carol Ada, MD      . atorvastatin (LIPITOR) tablet 10 mg  10 mg Oral Daily Carol Ada, MD   10 mg at 11/14/19 0945  . chlorhexidine (PERIDEX) 0.12 % solution 15 mL  15 mL Mouth Rinse BID Carol Ada, MD   15 mL at 11/14/19 2152  . Chlorhexidine Gluconate Cloth 2 % PADS 6 each  6 each Topical Daily Carol Ada, MD   6 each at 11/15/19 1101  . feeding supplement (GLUCERNA SHAKE) (GLUCERNA SHAKE) liquid 237 mL  237 mL Oral BID BM Carol Ada, MD   237 mL at 11/11/19 1347  . ferumoxytol (FERAHEME) 510 mg in sodium chloride 0.9 % 100 mL IVPB  510 mg Intravenous Weekly Carol Ada, MD 468 mL/hr at 11/08/19 1615 510 mg at 11/08/19 1615  . furosemide (LASIX) injection 20 mg  20 mg Intravenous BID Carol Ada, MD   20 mg at 11/15/19 1008  . haloperidol lactate (HALDOL) injection 2 mg  2 mg Intravenous Q6H PRN Carol Ada, MD   2 mg at 11/11/19 2139  . HYDROcodone-acetaminophen (NORCO/VICODIN) 5-325 MG per tablet 1 tablet  1  tablet Oral Q8H PRN Carol Ada, MD   1 tablet at 11/11/19 2106  . hydrOXYzine (ATARAX/VISTARIL) tablet 50 mg  50 mg Oral TID PRN Carol Ada, MD   50 mg at 11/11/19 2106  . insulin aspart (novoLOG) injection 0-6 Units  0-6 Units Subcutaneous TID WC Carol Ada, MD      . lactated ringers infusion   Intravenous Continuous Carol Ada, MD 20 mL/hr at 11/15/19 1353 New Bag at 11/15/19 1353  . lip balm (CARMEX) ointment   Topical PRN Carol Ada, MD      . loperamide (IMODIUM) capsule 2 mg  2 mg Oral Q6H PRN Carol Ada, MD   2 mg at 11/10/19 1325  . MEDLINE mouth rinse  15 mL Mouth Rinse q12n4p  Carol Ada, MD   15 mL at 11/12/19 1630  . ondansetron (ZOFRAN) injection 4 mg  4 mg Intravenous Q6H PRN Carol Ada, MD   4 mg at 11/15/19 1436  . pantoprazole (PROTONIX) injection 40 mg  40 mg Intravenous Q12H Carol Ada, MD   40 mg at 11/15/19 1010  . sodium chloride flush (NS) 0.9 % injection 10-40 mL  10-40 mL Intracatheter PRN Carol Ada, MD          Review of Systems: 10 systems reviewed and negative except per interval history/subjective  Physical Exam: Vitals:   11/15/19 1330 11/15/19 1500  BP: (!) 126/47 (!) 154/58  Pulse: 83   Resp: 20 (!) 21  Temp:    SpO2: 94%    Total I/O In: 38.7 [I.V.:38.7] Out: -   Intake/Output Summary (Last 24 hours) at 11/15/2019 1532 Last data filed at 11/15/2019 1400 Gross per 24 hour  Intake 1316.17 ml  Output 2190 ml  Net -873.83 ml      Test Results I personally reviewed new and old clinical labs and radiology tests Lab Results  Component Value Date   NA 144 11/15/2019   K 4.0 11/15/2019   CL 115 (H) 11/15/2019   CO2 21 (L) 11/15/2019   BUN 62 (H) 11/15/2019   CREATININE 3.49 (H) 11/15/2019   CALCIUM 8.3 (L) 11/15/2019   ALBUMIN 2.0 (L) 11/13/2019   PHOS 4.2 11/13/2019

## 2019-11-15 NOTE — Progress Notes (Addendum)
PROGRESS NOTE    KASSY MCENROE  IRW:[id number] DOB: [birth year] DOA: 11/05/2019 PCP: [name], MD    Brief Narrative:64 year old female with super morbid obesity, hypertension, type 2 diabetes, stage IV chronic kidney disease with baseline creatinine about 1.7, recent left leg injury and fracture recovering at the skilled nursing facility brought to the ER on 9/14 for confusion.  Patient was reportedly unresponsive to verbal stimuli and subsequently was incoherent.  In the emergency room, she improved spontaneously and responded to questions appropriately.  Patient is on multiple medications including narcotics and benzodiazepine.  Admitted with mild acute metabolic encephalopathy and acute renal failure. Patient continued to have diarrhea 11/15/2019 on the evening of 11/10/2019 patient started having bloody stools with frank bleeding per rectum with drop in hemoglobin she received 6 unit of packed RBC at that time she had colonoscopy done on 11/14/2019 showed diverticuli in the ascending colon otherwise with normal exam however it was not an optimal study.  Patient continues with slow drifting down of hemoglobin received on the unit of packed RBC on 11/14/2019 so far she has received a total of 7 units of packed RBC.  She is planned to have EGD on 11/15/2019.  Assessment & Plan:   Principal Problem:   Acute renal failure (HCC) Active Problems:   Type 2 diabetes mellitus with stage 4 chronic kidney disease (HCC)   Metabolic encephalopathy   CKD (chronic kidney disease) stage 4, GFR 15-29 ml/min (HCC)   Pressure injury of skin   Morbid obesity (HCC)   GI bleeding   Encephalopathy acute   Vaginal discharge   Anemia associated with acute blood loss   #1 AKI with CKD stage IV this is thought to be due to multifactorial causes including hypotension multiple medications including chlorthalidone.  It appears her creatinine is improving after Lasix challenge.  Nephrology following  appreciate. Lasix dose had to be decreased to 20 mg daily due to hypotension and ongoing need for blood transfusion. She is still positive by 9.5 L though it is better and down by 1 L since yesterday.  #2  Acute on chronic anemia secondary to lower GI bleed she received 7 units of packed RBC.  Patient  had colonoscopy 9/23 no evidence of active bleeding was noted. Hemoglobin was 7.1 yesterday she received another unit of blood transfusion hemoglobin today is 8.3 with appropriate increase.  It looks like she already received 2 units of Feraheme.      #3 acute metabolic encephalopathy seems to have resolved, this is thought to be likely related to polypharmacy.  Prior to admission patient was on Percocet hydroxyzine Cymbalta Neurontin and Xanax at the nursing home.  These medications have been on hold mostly except for low-dose Percocet and intermittent Atarax that have been restarted.  She is awake and alert today and answers all my questions appropriately.  #4 type 2 diabetes with hypoglycemic episodes on SSI. CBG (last 3)  Recent Labs    11/14/19 1539 11/14/19 2004 11/15/19 0751  GLUCAP 91 91 80    #5 status post UTI treated with Rocephin for 3 days.   Estimated body mass index is 75.9 kg/m as calculated from the following:   Height as of this encounter: 5\' 2"  (1.575 m).   Weight as of this encounter: 188.2 kg.  DVT prophylaxis: SCD code Status: Full code Family Communication: None at bedside Disposition Plan:  Status is: Inpatient  Dispo:  Patient From: [place]  Planned Disposition: [place]  Expected discharge date: Unknown  medically stable for discharge: No still being monitored for GI bleed and AKI with daily labs  Consultants: GI nephrology   Procedures: A-line, subclavian line Antimicrobials: Anti-infectives (From admission, onward)   Start     Dose/Rate Route Frequency Ordered Stop   11/06/19 0600  metroNIDAZOLE (FLAGYL) IVPB 500  mg  Status:  Discontinued        500 mg 100 mL/hr over 60 Minutes Intravenous Every 8 hours 11/06/19 0417 11/11/19 1012   11/06/19 0500  cefTRIAXone (ROCEPHIN) 1 g in sodium chloride 0.9 % 100 mL IVPB  Status:  Discontinued        1 g 200 mL/hr over 30 Minutes Intravenous Every 24 hours 11/06/19 0417 11/09/19 1029       Subjective:  She is awake and alert resting in bed she reports she has not walked since she had fracture of the left lower extremity 3 months ago  Anxious to have EGD today  Objective: Vitals:   11/15/19 0300 11/15/19 0400 11/15/19 0500 11/15/19 0800  BP: (!) 106/40 (!) 109/34  (!) 122/39  Pulse: 89 87  89  Resp: (!) 24 (!) 23  (!) 21  Temp:    98.5 F (36.9 C)  TempSrc:    Oral  SpO2: 100% 96%  99%  Weight:   (!) 188.2 kg   Height:        Intake/Output Summary (Last 24 hours) at 11/15/2019 1048 Last data filed at 11/15/2019 1829 Gross per 24 hour  Intake 1307.5 ml  Output 2190 ml  Net -882.5 ml   Filed Weights   11/12/19 0600 11/13/19 1455 11/15/19 0500  Weight: (!) 208.7 kg (!) 207.5 kg (!) 188.2 kg    Examination: Flexi-Seal in place with loose stools General exam: Appears calm and comfortable  Respiratory system: Clear to auscultation. Respiratory effort normal. Cardiovascular system: S1 & S2 heard, RRR. No JVD, murmurs, rubs, gallops or clicks. No pedal edema. Gastrointestinal system: Abdomen is distended, soft and nontender. No organomegaly or masses felt. Normal bowel sounds heard. Central nervous system: Alert and oriented. No focal neurological deficits. Extremities: 2+ pitting edema Skin: No rashes, lesions or ulcers Psychiatry: Judgement and insight appear normal. Mood & affect appropriate.     Data Reviewed: I have personally reviewed following labs and imaging studies  CBC: Recent Labs  Lab 11/09/19 0831 11/09/19 0831 11/10/19 0410 11/11/19 0041 11/11/19 0220 11/11/19 1159 11/13/19 1823 11/14/19 0240 11/14/19 1030  11/14/19 1903 11/15/19 0509  WBC 6.7  --  8.1  --  8.8  --   --   --   --   --   --   HGB 8.3*   < > 8.1*   < > 5.6*   < > 8.5* 7.9* 7.1* 8.5* 8.3*  HCT 28.1*   < > 27.4*   < > 18.7*   < > 25.9* 24.1* 21.7* 26.4* 25.2*  MCV 84.4  --  84.3  --  84.6  --   --   --   --   --   --   PLT 608*  --  612*  --  453*  --   --   --   --   --   --    < > = values in this interval not displayed.   Basic Metabolic Panel: Recent Labs  Lab 11/09/19 0831 11/09/19 0831 11/10/19 0410 11/10/19 0410 11/11/19 9371 11/12/19 0255 11/13/19 0257 11/14/19 0240 11/15/19 0509  NA  140   < > 143   < > 143 147* 140 143 144  K 4.6   < > 4.4   < > 4.6 4.3 4.2 3.9 4.0  CL 106   < > 108   < > 113* 116* 116* 117* 115*  CO2 22   < > 22   < > 21* 19* 18* 16* 21*  GLUCOSE 82   < > 105*   < > 129* 119* 145* 86 87  BUN 61*   < > 61*   < > 70* 82* 77* 66* 62*  CREATININE 6.86*   < > 6.64*   < > 5.99* 5.27* 4.27* 3.77* 3.49*  CALCIUM 9.4   < > 8.9   < > 7.9* 8.4* 7.7* 7.7* 8.3*  PHOS 8.4*  --  7.8*  --  6.6* 5.6* 4.2  --   --    < > = values in this interval not displayed.   GFR: Estimated Creatinine Clearance: 26.4 mL/min (A) (by C-G formula based on SCr of 3.49 mg/dL (H)). Liver Function Tests: Recent Labs  Lab 11/09/19 0831 11/10/19 0410 11/11/19 0220 11/12/19 0255 11/13/19 0257  ALBUMIN 2.6* 2.5* 2.0* 2.1* 2.0*   No results for input(s): LIPASE, AMYLASE in the last 168 hours. No results for input(s): AMMONIA in the last 168 hours. Coagulation Profile: No results for input(s): INR, PROTIME in the last 168 hours. Cardiac Enzymes: No results for input(s): CKTOTAL, CKMB, CKMBINDEX, TROPONINI in the last 168 hours. BNP (last 3 results) No results for input(s): PROBNP in the last 8760 hours. HbA1C: No results for input(s): HGBA1C in the last 72 hours. CBG: Recent Labs  Lab 11/14/19 0745 11/14/19 1234 11/14/19 1539 11/14/19 2004 11/15/19 0751  GLUCAP 87 82 91 91 80   Lipid Profile: No results for  input(s): CHOL, HDL, LDLCALC, TRIG, CHOLHDL, LDLDIRECT in the last 72 hours. Thyroid Function Tests: No results for input(s): TSH, T4TOTAL, FREET4, T3FREE, THYROIDAB in the last 72 hours. Anemia Panel: No results for input(s): VITAMINB12, FOLATE, FERRITIN, TIBC, IRON, RETICCTPCT in the last 72 hours. Sepsis Labs: No results for input(s): PROCALCITON, LATICACIDVEN in the last 168 hours.  Recent Results (from the past 240 hour(s))  SARS Coronavirus 2 by RT PCR (hospital order, performed in [facility] hospital lab) Nasopharyngeal Nasopharyngeal Swab     Status: None   Collection Time: 11/05/19  9:48 PM   Specimen: Nasopharyngeal Swab  Result Value Ref Range Status   SARS Coronavirus 2 NEGATIVE NEGATIVE Final    Comment: (NOTE) SARS-CoV-2 target nucleic acids are NOT DETECTED.  The SARS-CoV-2 RNA is generally detectable in upper and lower respiratory specimens during the acute phase of infection. The lowest concentration of SARS-CoV-2 viral copies this assay can detect is 250 copies / mL. A negative result does not preclude SARS-CoV-2 infection and should not be used as the sole basis for treatment or other patient management decisions.  A negative result may occur with improper specimen collection / handling, submission of specimen other than nasopharyngeal swab, presence of viral mutation(s) within the areas targeted by this assay, and inadequate number of viral copies (<250 copies / mL). A negative result must be combined with clinical observations, patient history, and epidemiological information.  Fact Sheet for Patients:   StrictlyIdeas.no  Fact Sheet for Healthcare Providers: BankingDealers.co.za  This test is not yet approved or  cleared by the [place] FDA and has been authorized for detection and/or diagnosis of SARS-CoV-2 by FDA under an  Emergency Use Authorization (EUA).  This EUA will remain in effect (meaning this  test can be used) for the duration of the COVID-19 declaration under Section 564(b)(1) of the Act, 21 U.S.C. section 360bbb-3(b)(1), unless the authorization is terminated or revoked sooner.  Performed at [facility], [place] [address] [place], [place] [address]   Culture, Urine     Status: Abnormal   Collection Time: 11/08/19  1:49 PM   Specimen: Urine, Catheterized  Result Value Ref Range Status   Specimen Description   Final    URINE, CATHETERIZED Performed at [place] [address], [place], [place] [address]    Special Requests   Final    NONE Performed at [facility], [place] [address], [place], [place] [address]    Culture MULTIPLE SPECIES PRESENT, SUGGEST RECOLLECTION (A)  Final   Report Status 11/09/2019 FINAL  Final  Culture, Urine     Status: Abnormal   Collection Time: 11/09/19 12:04 PM   Specimen: Urine, Random  Result Value Ref Range Status   Specimen Description   Final    URINE, RANDOM Performed at [place] [address], [place], [place] [address]    Special Requests   Final    NONE Performed at [facility], [place] [address], [place], [place] [address]    Culture (A)  Final    <10,000 COLONIES/mL INSIGNIFICANT GROWTH Performed at [place] [address], [place], [place] [address]    Report Status 11/10/2019 FINAL  Final  MRSA PCR Screening     Status: None   Collection Time: 11/14/19  6:30 PM   Specimen: Nasopharyngeal  Result Value Ref Range Status   MRSA by PCR NEGATIVE NEGATIVE Final    Comment:        The GeneXpert MRSA Assay (FDA approved for NASAL specimens only), is one component of a comprehensive MRSA colonization surveillance program. It is not intended to diagnose MRSA infection nor to guide or monitor treatment for MRSA infections. Performed at [facility], [place] [address] [place], [place] [address]           Radiology Studies: No results found.      Scheduled Meds: . atorvastatin  10 mg Oral Daily  . chlorhexidine  15 mL Mouth Rinse BID  . Chlorhexidine Gluconate Cloth  6 each Topical Daily  . feeding supplement (GLUCERNA SHAKE)  237 mL Oral BID BM  . furosemide  20 mg Intravenous BID  . insulin aspart  0-6 Units Subcutaneous TID WC  . mouth rinse  15 mL Mouth Rinse q12n4p  . pantoprazole (PROTONIX) IV  40 mg Intravenous Q12H   Continuous Infusions: . sodium chloride 10 mL/hr at 11/14/19 0054  . ferumoxytol 510 mg (11/08/19 1615)     LOS: 9 days     Georgette Shell, MD 11/15/2019, 10:48 AM

## 2019-11-15 NOTE — Op Note (Signed)
[facility] Patient Name: [name] [name] Procedure Date: 11/15/2019 MRN: [id number] Attending MD: Carol Ada , MD Date of Birth: [birth year] CSN: [id number] Age: 64 Admit Type: Inpatient Procedure:                Upper GI endoscopy Indications:              Acute post hemorrhagic anemia, Hematochezia Providers:                Carol Ada, MD, Doristine Johns, RN, Cherylynn Ridges, Technician, Arnoldo Hooker, CRNA Referring MD:              Medicines:                Propofol per Anesthesia Complications:            No immediate complications. Estimated Blood Loss:     Estimated blood loss: none. Procedure:                Pre-Anesthesia Assessment:                           - Prior to the procedure, a History and Physical                            was performed, and patient medications and                            allergies were reviewed. The patient's tolerance of                            previous anesthesia was also reviewed. The risks                            and benefits of the procedure and the sedation                            options and risks were discussed with the patient.                            All questions were answered, and informed consent                            was obtained. Prior Anticoagulants: The patient has                            taken no previous anticoagulant or antiplatelet                            agents. ASA Grade Assessment: III - A patient with                            severe systemic disease. After reviewing the risks  and benefits, the patient was deemed in                            satisfactory condition to undergo the procedure.                           - Sedation was administered by an anesthesia                            professional. Deep sedation was attained.                           After obtaining informed consent, the endoscope was                             passed under direct vision. Throughout the                            procedure, the patient's blood pressure, pulse, and                            oxygen saturations were monitored continuously. The                            [id number]-[id number] ([id number] Olympus gastroscope was                            introduced through the mouth, and advanced to the                            second part of duodenum. The upper GI endoscopy was                            accomplished without difficulty. The patient                            tolerated the procedure well. Scope In: Scope Out: Findings:      The esophagus was normal.      Red blood was found in the gastric body, in the gastric antrum and at       the pylorus.      One adherent clot cratered duodenal ulcer with an adherent clot (Forrest       Class IIb) was found in the duodenal bulb. The lesion was 15 mm in       largest dimension. Area was successfully injected with 2 mL of a       1:10,000 solution of epinephrine for hemostasis. For hemostasis,       hemostatic spray was deployed. A single spray was applied. There was no       bleeding at the end of the procedure.      Fresh blood was noted in the gastric lumen. There was a fresh clot       emanating from the pyloric channel. Further investigation showed that       the clot was from a duodenal bulb ulcer. Suctioning of the clot cleared  the ulcer and visualization of the ulcer base was obtained. There was a       visible vessel. Beneath the vessel pulsations were noted. There was a       serious potential of causing a life-threatening bleed with application       of BICAP as it was perceived to be a large vessel. Two ml of 1:10,000       Epi was injected and one application of hemospray was performed. There       was no evidence of any bleeding. The ulcer was not completely visualized       as it extended distally towards D2. Impression:               - Normal esophagus.                            - Red blood in the gastric body, in the gastric                            antrum and in the pylorus.                           - Adherent clot duodenal ulcer with an adherent                            clot (Forrest Class IIb). Injected. hemostatic                            spray applied.                           - No specimens collected. Moderate Sedation:      Not Applicable - Patient had care per Anesthesia. Recommendation:           - Return patient to hospital ward for ongoing care.                           - NPO.                           - Continue present medications.                           - Follow HGB and transfuse as necessary.                           - ? Relook EGD in 2 days. Procedure Code(s):        --- Professional ---                           [phone], Esophagogastroduodenoscopy, flexible,                            transoral; with control of bleeding, any method Diagnosis Code(s):        --- Professional ---                           K92.2, Gastrointestinal  hemorrhage, unspecified                           K26.4, Chronic or unspecified duodenal ulcer with                            hemorrhage                           D62, Acute posthemorrhagic anemia                           K92.1, Melena (includes Hematochezia) CPT copyright 2019 American [employer] Association. All rights reserved. The codes documented in this report are preliminary and upon coder review may  be revised to meet current compliance requirements. Carol Ada, MD Carol Ada, MD 11/15/2019 1:25:53 PM This report has been signed electronically. Number of Addenda: 0

## 2019-11-15 NOTE — Progress Notes (Signed)
Physical Therapy Treatment Patient Details Name: [name] [name] MRN: [id number] DOB: 23-Oct-1955 Today's Date: 11/15/2019    History of Present Illness Patient is 63 y.o. female with PMH significant for HTN, DM, neuropathy, Lt knee fracture, CKD, obesity. Pt admitted from SN[facility] for AMS. Per prior hospital admission  ([facility] home [facility] called and reported unsafe living conditions as caregiver unable to assist pt).  Patient had recent hospital admission on 10/18/2019-10/20/2019 for possible GI bleed.  She was found to have severe constipation and not a GI bleed.  We also found that she had a left knee fracture, unknown when the fracture occurred sometime in August. Pt NWB on Lt LE last admission from 9/1-9/7.    PT Comments    +3 total assist for rolling and for sidelying to sitting at edge of bed. At edge of bed pt's feet weren't able to reach the floor with bed in lowest position and her hips began to slide towards edge of bed, unable to safety sit with assist of 3 so returned to supine. Performed trunk pull ups with +3 assist, pt not able to come to full upright position but raised trunk ~25* for 20 seconds x 2. Reviewed LE exercises and encouraged pt to perform these independently.    Follow Up Recommendations  SNF;Supervision/Assistance - 24 hour     Equipment Recommendations  None recommended by PT    Recommendations for Other Services       Precautions / Restrictions Precautions Precautions: Fall Restrictions Weight Bearing Restrictions: Yes LLE Weight Bearing: Non weight bearing Other Position/Activity Restrictions: Per Dr. Reather Littler 9/5 note: NWB pillow splints. Bed to chair stabilizing left knee. Need To rexray in three weeks. Most likely patient will be wheel chair bound even with healing.    Mobility  Bed Mobility Overal bed mobility: Needs Assistance Bed Mobility: Rolling;Sit to Supine;Sit to Sidelying Rolling: +2 for physical assistance;Total assist;+2 for  safety/equipment Sidelying to sit: Total assist;+2 for safety/equipment;+2 for physical assistance;HOB elevated   Sit to supine: Total assist;+2 for physical assistance;+2 for safety/equipment   General bed mobility comments: +3 assist for all mobility. Attempted transfer into sitting at side of bed. Due to height of bed and air mattress and patient's specific height and weight - edge of bed sitting precarious. Returned to supine for safety. Raised head of bed to 50* and pt pulled trunk up ~25* using PT and OT's arms to pull up, also assist at posterior trunk from rehab tech. Pt unable to come to full vertical position, attempted x 2, only able to hold upright position ~20 seconds. Limited by fatigue.  Transfers                    Ambulation/Gait                 Stairs             Wheelchair Mobility    Modified Rankin (Stroke Patients Only)       Balance   Sitting-balance support: Bilateral upper extremity supported;Feet unsupported Sitting balance-Leahy Scale: Poor                                      Cognition Arousal/Alertness: Awake/alert Behavior During Therapy: WFL for tasks assessed/performed Overall Cognitive Status: Within Functional Limits for tasks assessed  General Comments: Patient pleasant and oriented. However, patient reports seeing a large rat last night. Wants to get up and walk down the hall. Appears to not have any insight into her deficits. Patient's conversation tangential and had to be cued to stay on task.      Exercises General Exercises - Lower Extremity Ankle Circles/Pumps: AROM;Right;10 reps;Supine Quad Sets: AROM;Both;5 reps;Supine Gluteal Sets: AROM;Both;5 reps;Supine Other Exercises Other Exercises: Required +3 assistance to performed modified sit ups in bed x 2. Exhibits functional UB strength but poor coor strength and endurance.    General Comments         Pertinent Vitals/Pain Pain Assessment: No/denies pain    Home Living                      Prior Function            PT Goals (current goals can now be found in the care plan section) Acute Rehab PT Goals Patient Stated Goal: get up and move and walk again Time For Goal Achievement: 11/22/19 Potential to Achieve Goals: Fair Progress towards PT goals: Progressing toward goals    Frequency    Min 2X/week      PT Plan Current plan remains appropriate    Co-evaluation PT/OT/SLP Co-Evaluation/Treatment: Yes Reason for Co-Treatment: Complexity of the patient's impairments (multi-system involvement);For patient/therapist safety;To address functional/ADL transfers PT goals addressed during session: Mobility/safety with mobility;Strengthening/ROM OT goals addressed during session: Strengthening/ROM      AM-PAC PT "6 Clicks" Mobility   Outcome Measure  Help needed turning from your back to your side while in a flat bed without using bedrails?: Total Help needed moving from lying on your back to sitting on the side of a flat bed without using bedrails?: Total Help needed moving to and from a bed to a chair (including a wheelchair)?: Total Help needed standing up from a chair using your arms (e.g., wheelchair or bedside chair)?: Total Help needed to walk in hospital room?: Total Help needed climbing 3-5 steps with a railing? : Total 6 Click Score: 6    End of Session   Activity Tolerance: Patient tolerated treatment well Patient left: in bed;with call bell/phone within reach;with bed alarm set;with nursing/sitter in room Nurse Communication: Mobility status PT Visit Diagnosis: Muscle weakness (generalized) (M62.81);Other abnormalities of gait and mobility (R26.89);Difficulty in walking, not elsewhere classified (R26.2)     Time: 2248-2500 PT Time Calculation (min) (ACUTE ONLY): 20 min  Charges:  $Therapeutic Activity: 8-22 mins                    Blondell Reveal Kistler PT 11/15/2019  Acute Rehabilitation Services Pager [phone] Office [phone]

## 2019-11-15 NOTE — Transfer of Care (Addendum)
Immediate Anesthesia Transfer of Care Note  Patient: [name] [name]  Procedure(s) Performed: Procedure(s): ESOPHAGOGASTRODUODENOSCOPY (EGD) WITH PROPOFOL (N/A) SCLEROTHERAPY  Patient Location: PACU  Anesthesia Type:General  Level of Consciousness:  sedated, patient cooperative and responds to stimulation  Airway & Oxygen Therapy:Patient Spontanous Breathing and Patient connected to face mask oxgen  Post-op Assessment:  Report given to PACU RN and Post -op Vital signs reviewed and stable  Post vital signs:  Reviewed and stable  Last Vitals:  Vitals:   11/15/19 0800 11/15/19 1157  BP: (!) 122/39 134/63  Pulse: 89 82  Resp: (!) 21 (!) 25  Temp: 36.9 C 36.7 C  SpO2: 98% 28%    Complications: No apparent anesthesia complications

## 2019-11-15 NOTE — Progress Notes (Signed)
Pt due for A-line set up change today 11/15/19, pt no longer has an a-line. RT will D/C order. Vitals stable at this time, RT will monitor.

## 2019-11-16 ENCOUNTER — Inpatient Hospital Stay (HOSPITAL_COMMUNITY): Payer: No Typology Code available for payment source

## 2019-11-16 HISTORY — PX: IR ANGIOGRAM VISCERAL SELECTIVE: IMG657

## 2019-11-16 HISTORY — PX: IR US GUIDE VASC ACCESS RIGHT: IMG2390

## 2019-11-16 HISTORY — PX: IR ANGIOGRAM SELECTIVE EACH ADDITIONAL VESSEL: IMG667

## 2019-11-16 HISTORY — PX: IR EMBO ART  VEN HEMORR LYMPH EXTRAV  INC GUIDE ROADMAPPING: IMG5450

## 2019-11-16 LAB — GLUCOSE, CAPILLARY
Glucose-Capillary: 83 mg/dL (ref 70–99)
Glucose-Capillary: 65 mg/dL — ABNORMAL LOW (ref 70–99)
Glucose-Capillary: 82 mg/dL (ref 70–99)
Glucose-Capillary: 86 mg/dL (ref 70–99)

## 2019-11-16 LAB — CBC
HCT: 24.3 % — ABNORMAL LOW (ref 36.0–46.0)
Hemoglobin: 7.7 g/dL — ABNORMAL LOW (ref 12.0–15.0)
MCH: 27.8 pg (ref 26.0–34.0)
MCHC: 31.7 g/dL (ref 30.0–36.0)
MCV: 87.7 fL (ref 80.0–100.0)
Platelets: 281 10*3/uL (ref 150–400)
RBC: 2.77 MIL/uL — ABNORMAL LOW (ref 3.87–5.11)
RDW: 19.1 % — ABNORMAL HIGH (ref 11.5–15.5)
WBC: 8.2 10*3/uL (ref 4.0–10.5)
nRBC: 0.5 % — ABNORMAL HIGH (ref 0.0–0.2)

## 2019-11-16 LAB — BASIC METABOLIC PANEL
Anion gap: 11 (ref 5–15)
BUN: 58 mg/dL — ABNORMAL HIGH (ref 8–23)
CO2: 22 mmol/L (ref 22–32)
Calcium: 8.5 mg/dL — ABNORMAL LOW (ref 8.9–10.3)
Chloride: 114 mmol/L — ABNORMAL HIGH (ref 98–111)
Creatinine, Ser: 3.31 mg/dL — ABNORMAL HIGH (ref 0.44–1.00)
GFR calc Af Amer: 16 mL/min — ABNORMAL LOW (ref >60)
GFR calc non Af Amer: 14 mL/min — ABNORMAL LOW (ref >60)
Glucose, Bld: 85 mg/dL (ref 70–99)
Potassium: 3.8 mmol/L (ref 3.5–5.1)
Sodium: 147 mmol/L — ABNORMAL HIGH (ref 135–145)

## 2019-11-16 LAB — HEMOGLOBIN AND HEMATOCRIT, BLOOD
HCT: 29.1 % — ABNORMAL LOW (ref 36.0–46.0)
Hemoglobin: 9.6 g/dL — ABNORMAL LOW (ref 12.0–15.0)

## 2019-11-16 LAB — PREPARE RBC (CROSSMATCH)

## 2019-11-16 MED ORDER — FENTANYL CITRATE (PF) 100 MCG/2ML IJ SOLN
INTRAMUSCULAR | Status: AC | PRN
  Administered 2019-11-16 (×2): 25 ug via INTRAVENOUS

## 2019-11-16 MED ORDER — DIPHENHYDRAMINE HCL 25 MG PO CAPS
25.0000 mg | ORAL_CAPSULE | Freq: Four times a day (QID) | ORAL | Status: DC | PRN
  Administered 2019-11-16 – 2019-11-20 (×2): 25 mg via ORAL
  Filled 2019-11-16 (×2): qty 1

## 2019-11-16 MED ORDER — DEXTROSE 50 % IV SOLN
INTRAVENOUS | Status: AC
  Administered 2019-11-16: 25 mL via INTRAVENOUS
  Filled 2019-11-16: qty 50

## 2019-11-16 MED ORDER — IOHEXOL 300 MG/ML  SOLN
100.0000 mL | Freq: Once | INTRAMUSCULAR | Status: AC | PRN
  Administered 2019-11-16: 70 mL via INTRA_ARTERIAL

## 2019-11-16 MED ORDER — LIDOCAINE HCL 1 % IJ SOLN
INTRAMUSCULAR | Status: AC
  Filled 2019-11-16: qty 20

## 2019-11-16 MED ORDER — MIDAZOLAM HCL 2 MG/2ML IJ SOLN
INTRAMUSCULAR | Status: AC | PRN
  Administered 2019-11-16: 1 mg via INTRAVENOUS
  Administered 2019-11-16: 0.5 mg via INTRAVENOUS

## 2019-11-16 MED ORDER — POLYVINYL ALCOHOL 1.4 % OP SOLN
1.0000 [drp] | OPHTHALMIC | Status: DC | PRN
  Administered 2019-11-16 – 2019-11-18 (×3): 1 [drp] via OPHTHALMIC
  Filled 2019-11-16: qty 15

## 2019-11-16 MED ORDER — MIDAZOLAM HCL 2 MG/2ML IJ SOLN
INTRAMUSCULAR | Status: AC
  Filled 2019-11-16: qty 2

## 2019-11-16 MED ORDER — DIPHENHYDRAMINE HCL 25 MG PO CAPS
25.0000 mg | ORAL_CAPSULE | Freq: Once | ORAL | Status: AC
  Administered 2019-11-16: 25 mg via ORAL
  Filled 2019-11-16: qty 1

## 2019-11-16 MED ORDER — SODIUM CHLORIDE 0.9 % IV SOLN: INTRAVENOUS | Status: DC

## 2019-11-16 MED ORDER — FENTANYL CITRATE (PF) 100 MCG/2ML IJ SOLN
INTRAMUSCULAR | Status: AC
Start: 2019-11-16 — End: 2019-11-17
  Filled 2019-11-16: qty 2

## 2019-11-16 MED ORDER — IOHEXOL 300 MG/ML  SOLN
100.0000 mL | Freq: Once | INTRAMUSCULAR | Status: AC | PRN
  Administered 2019-11-16: 10 mL via INTRA_ARTERIAL

## 2019-11-16 MED ORDER — SODIUM CHLORIDE 0.9% IV SOLUTION: Freq: Once | INTRAVENOUS | Status: AC
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
## 2019-11-16 NOTE — Sedation Documentation (Signed)
Direct pressure to groin site in place

## 2019-11-16 NOTE — Sedation Documentation (Addendum)
Patient transported to ICU via bed. Bedside report given to [employer] . Groin site assessed with RN at the bedside. Site is unremarkable. Soft to palpation. No drainage noted.

## 2019-11-16 NOTE — Procedures (Signed)
Interventional Radiology Procedure Note  Procedure: celiac and GDA angios GDA coil embo    Complications: None  Estimated Blood Loss:  min  Findings: No active arterial bleeding GDA embo  Performed for known DU by endo  Tamera Punt, MD

## 2019-11-16 NOTE — Progress Notes (Signed)
PROGRESS NOTE    [name] [name]  ZYY:[id number] DOB: 27-Sep-1955 DOA: 11/05/2019 PCP: [name], MD    Brief Narrative:64 year old female with super morbid obesity, hypertension, type 2 diabetes, stage IV chronic kidney disease with baseline creatinine about 1.7, recent left leg injury and fracture recovering at the skilled nursing facility brought to the ER on 9/14 for confusion.  Patient was reportedly unresponsive to verbal stimuli and subsequently was incoherent.  In the emergency room, she improved spontaneously and responded to questions appropriately.  Patient is on multiple medications including narcotics and benzodiazepine.  Admitted with mild acute metabolic encephalopathy and acute renal failure. Patient continued to have diarrhea 11/15/2019 on the evening of 11/10/2019 patient started having bloody stools with frank bleeding per rectum with drop in hemoglobin she received 6 unit of packed RBC at that time she had colonoscopy done on 11/14/2019 showed diverticuli in the ascending colon otherwise with normal exam however it was not an optimal study.  Patient continues with slow drifting down of hemoglobin received on the unit of packed RBC on 11/14/2019 so far she has received a total of 7 units of packed RBC.  She is planned to have EGD on 11/15/2019.  Assessment & Plan:   Principal Problem:   Acute renal failure (HCC) Active Problems:   Type 2 diabetes mellitus with stage 4 chronic kidney disease (HCC)   Metabolic encephalopathy   CKD (chronic kidney disease) stage 4, GFR 15-29 ml/min (HCC)   Pressure injury of skin   Morbid obesity (HCC)   GI bleeding   Encephalopathy acute   Vaginal discharge   Anemia associated with acute blood loss   #1 AKI with CKD stage IV this is thought to be due to multifactorial causes including hypotension multiple medications including chlorthalidone.  It appears her creatinine is improving after Lasix challenge.  Nephrology following  appreciate. Lasix dose had to be decreased to 20 mg daily due to hypotension and ongoing need for blood transfusion. She is still positive by 9.5 L though it is better and down by 1 L since yesterday.  #2  Acute on chronic anemia secondary to lower GI bleed she received 7 units of packed RBC.  Patient  had colonoscopy 9/23 no evidence of active bleeding was noted. egd 9/24 -normal esophagus, red blood in the gastric body and in the antrum and pylorus, adherent clot duodenal ulcer injected with hemostatic spray.  Patient continues to drop her H&H.  Patient reported 1 brown bowel movement staff reported patient had another bloody bowel movement overnight.  IR consulted by Dr. Benson Norway.  Hb 7.7 Will arrange type, crossmatch and transfusion of 1 Units packed RBC's  #3 acute metabolic encephalopathy seems to have resolved, this is thought to be likely related to polypharmacy.  Prior to admission patient was on Percocet hydroxyzine Cymbalta Neurontin and Xanax at the nursing home.  These medications have been on hold mostly except for low-dose Percocet and intermittent Atarax that have been restarted.  She is awake and alert today and answers all my questions appropriately.  #4 type 2 diabetes with hypoglycemic episodes on SSI. CBG (last 3)  Recent Labs    11/15/19 2123 11/16/19 0748 11/16/19 1238  GLUCAP 93 83 65*    #5 status post UTI treated with Rocephin for 3 days.   Estimated body mass index is 76.21 kg/m as calculated from the following:   Height as of this encounter: 5\' 2"  (1.575 m).   Weight as of this encounter: 189  kg.  DVT prophylaxis: SCD code Status: Full code Family Communication: None at bedside Disposition Plan:  Status is: Inpatient  Dispo:  Patient From: [facility]  Planned Disposition: [facility]  Expected discharge date: Unknown  medically stable for discharge: No still being monitored for GI bleed and AKI with daily labs  Consultants: GI  nephrology   Procedures: A-line, subclavian line Antimicrobials: Anti-infectives (From admission, onward)   Start     Dose/Rate Route Frequency Ordered Stop   11/06/19 0600  metroNIDAZOLE (FLAGYL) IVPB 500 mg  Status:  Discontinued        500 mg 100 mL/hr over 60 Minutes Intravenous Every 8 hours 11/06/19 0417 11/11/19 1012   11/06/19 0500  cefTRIAXone (ROCEPHIN) 1 g in sodium chloride 0.9 % 100 mL IVPB  Status:  Discontinued        1 g 200 mL/hr over 30 Minutes Intravenous Every 24 hours 11/06/19 0417 11/09/19 1029       Subjective:  She is awake and alert resting in bed no specific complaints She reported one brown bowel movement  Objective: Vitals:   11/16/19 1100 11/16/19 1200 11/16/19 1300 11/16/19 1400  BP:  126/90 (!) 143/58 (!) 138/55  Pulse:  76    Resp: 16 17 (!) 24 (!) 21  Temp:  98.7 F (37.1 C)    TempSrc:  Axillary    SpO2:   100%   Weight:      Height:        Intake/Output Summary (Last 24 hours) at 11/16/2019 1416 Last data filed at 11/16/2019 1300 Gross per 24 hour  Intake 440 ml  Output 2900 ml  Net -2460 ml   Filed Weights   11/13/19 1455 11/15/19 0500 11/16/19 0500  Weight: (!) 207.5 kg (!) 188.2 kg (!) 189 kg    Examination- General exam: Appears calm and comfortable  Respiratory system: Clear to auscultation. Respiratory effort normal. Cardiovascular system: S1 & S2 heard, RRR. No JVD, murmurs, rubs, gallops or clicks. No pedal edema. Gastrointestinal system: Abdomen is distended, soft and nontender. No organomegaly or masses felt. Normal bowel sounds heard. Central nervous system: Alert and oriented. No focal neurological deficits. Extremities: 2+ pitting edema Skin: No rashes, lesions or ulcers Psychiatry: Judgement and insight appear normal. Mood & affect appropriate.     Data Reviewed: I have personally reviewed following labs and imaging studies  CBC: Recent Labs  Lab 11/10/19 0410 11/11/19 0041 11/11/19 0220 11/11/19 1159  11/14/19 0240 11/14/19 1030 11/14/19 1903 11/15/19 0509 11/16/19 0430  WBC 8.1  --  8.8  --   --   --   --   --  8.2  HGB 8.1*   < > 5.6*   < > 7.9* 7.1* 8.5* 8.3* 7.7*  HCT 27.4*   < > 18.7*   < > 24.1* 21.7* 26.4* 25.2* 24.3*  MCV 84.3  --  84.6  --   --   --   --   --  87.7  PLT 612*  --  453*  --   --   --   --   --  281   < > = values in this interval not displayed.   Basic Metabolic Panel: Recent Labs  Lab 11/10/19 0410 11/10/19 0410 11/11/19 0220 11/11/19 0220 11/12/19 0255 11/13/19 0257 11/14/19 0240 11/15/19 0509 11/16/19 0430  NA 143   < > 143   < > 147* 140 143 144 147*  K 4.4   < >  4.6   < > 4.3 4.2 3.9 4.0 3.8  CL 108   < > 113*   < > 116* 116* 117* 115* 114*  CO2 22   < > 21*   < > 19* 18* 16* 21* 22  GLUCOSE 105*   < > 129*   < > 119* 145* 86 87 85  BUN 61*   < > 70*   < > 82* 77* 66* 62* 58*  CREATININE 6.64*   < > 5.99*   < > 5.27* 4.27* 3.77* 3.49* 3.31*  CALCIUM 8.9   < > 7.9*   < > 8.4* 7.7* 7.7* 8.3* 8.5*  PHOS 7.8*  --  6.6*  --  5.6* 4.2  --   --   --    < > = values in this interval not displayed.   GFR: Estimated Creatinine Clearance: 27.9 mL/min (A) (by C-G formula based on SCr of 3.31 mg/dL (H)). Liver Function Tests: Recent Labs  Lab 11/10/19 0410 11/11/19 0220 11/12/19 0255 11/13/19 0257  ALBUMIN 2.5* 2.0* 2.1* 2.0*   No results for input(s): LIPASE, AMYLASE in the last 168 hours. No results for input(s): AMMONIA in the last 168 hours. Coagulation Profile: No results for input(s): INR, PROTIME in the last 168 hours. Cardiac Enzymes: No results for input(s): CKTOTAL, CKMB, CKMBINDEX, TROPONINI in the last 168 hours. BNP (last 3 results) No results for input(s): PROBNP in the last 8760 hours. HbA1C: No results for input(s): HGBA1C in the last 72 hours. CBG: Recent Labs  Lab 11/15/19 1209 11/15/19 1622 11/15/19 2123 11/16/19 0748 11/16/19 1238  GLUCAP 78 114* 93 83 65*   Lipid Profile: No results for input(s): CHOL, HDL,  LDLCALC, TRIG, CHOLHDL, LDLDIRECT in the last 72 hours. Thyroid Function Tests: No results for input(s): TSH, T4TOTAL, FREET4, T3FREE, THYROIDAB in the last 72 hours. Anemia Panel: No results for input(s): VITAMINB12, FOLATE, FERRITIN, TIBC, IRON, RETICCTPCT in the last 72 hours. Sepsis Labs: No results for input(s): PROCALCITON, LATICACIDVEN in the last 168 hours.  Recent Results (from the past 240 hour(s))  Culture, Urine     Status: Abnormal   Collection Time: 11/08/19  1:49 PM   Specimen: Urine, Catheterized  Result Value Ref Range Status   Specimen Description   Final    URINE, CATHETERIZED Performed at [facility] [address] [place], [place] [address]    Special Requests   Final    NONE Performed at [facility], [place] [address] [place], [place] [address]    Culture MULTIPLE SPECIES PRESENT, SUGGEST RECOLLECTION (A)  Final   Report Status 11/09/2019 FINAL  Final  Culture, Urine     Status: Abnormal   Collection Time: 11/09/19 12:04 PM   Specimen: Urine, Random  Result Value Ref Range Status   Specimen Description   Final    URINE, RANDOM Performed at [facility] [address], [place], [place] [address]    Special Requests   Final    NONE Performed at [facility], [place] [address], [place], [place] [address]    Culture (A)  Final    <10,000 COLONIES/mL INSIGNIFICANT GROWTH Performed at [facility] [address], [place], [place] [address]    Report Status 11/10/2019 FINAL  Final  MRSA PCR Screening     Status: None   Collection Time: 11/14/19  6:30 PM   Specimen: Nasopharyngeal  Result Value Ref Range Status   MRSA by  PCR NEGATIVE NEGATIVE Final    Comment:        The GeneXpert MRSA Assay (FDA approved for NASAL specimens only), is one component of a comprehensive MRSA colonization surveillance program. It is not intended to diagnose MRSA infection nor to guide  or monitor treatment for MRSA infections. Performed at [facility], [place] [address], [place], [place] [address]          Radiology Studies: No results found.      Scheduled Meds: . atorvastatin  10 mg Oral Daily  . chlorhexidine  15 mL Mouth Rinse BID  . Chlorhexidine Gluconate Cloth  6 each Topical Daily  . feeding supplement (GLUCERNA SHAKE)  237 mL Oral BID BM  . insulin aspart  0-6 Units Subcutaneous TID WC  . lidocaine      . mouth rinse  15 mL Mouth Rinse q12n4p  . pantoprazole (PROTONIX) IV  40 mg Intravenous Q12H   Continuous Infusions: . lactated ringers 20 mL/hr at 11/15/19 1353     LOS: 10 days     Georgette Shell, MD 11/16/2019, 2:16 PM

## 2019-11-16 NOTE — Progress Notes (Signed)
Subjective: Patient reports that she is feeling well.  She had a bowel movement this AM.  Objective: Vital signs in last 24 hours: Temp:  [98.1 F (36.7 C)-99.5 F (37.5 C)] 99.5 F (37.5 C) (09/25 0400) Pulse Rate:  [82-94] 87 (09/25 1000) Resp:  [13-27] 22 (09/25 1000) BP: (106-166)/(30-91) 109/62 (09/25 1000) SpO2:  [94 %-100 %] 100 % (09/25 1000) Weight:  [245 kg] 189 kg (09/25 0500) Last BM Date: 11/16/19  Intake/Output from previous day: 09/24 0701 - 09/25 0700 In: 38.7 [I.V.:38.7] Out: 2000 [Urine:2000] Intake/Output this shift: Total I/O In: -  Out: 500 [Urine:500]  General appearance: alert and no distress Resp: clear to auscultation bilaterally Cardio: regular rate and rhythm GI: soft, non-tender; bowel sounds normal; no masses,  no organomegaly Extremities: extremities normal, atraumatic, no cyanosis or edema  Lab Results: Recent Labs    11/14/19 1903 11/15/19 0509 11/16/19 0430  WBC  --   --  8.2  HGB 8.5* 8.3* 7.7*  HCT 26.4* 25.2* 24.3*  PLT  --   --  281   BMET Recent Labs    11/14/19 0240 11/15/19 0509 11/16/19 0430  NA 143 144 147*  K 3.9 4.0 3.8  CL 117* 115* 114*  CO2 16* 21* 22  GLUCOSE 86 87 85  BUN 66* 62* 58*  CREATININE 3.77* 3.49* 3.31*  CALCIUM 7.7* 8.3* 8.5*   LFT No results for input(s): PROT, ALBUMIN, AST, ALT, ALKPHOS, BILITOT, BILIDIR, IBILI in the last 72 hours. PT/INR No results for input(s): LABPROT, INR in the last 72 hours. Hepatitis Panel No results for input(s): HEPBSAG, HCVAB, HEPAIGM, HEPBIGM in the last 72 hours. C-Diff No results for input(s): CDIFFTOX in the last 72 hours. Fecal Lactopherrin No results for input(s): FECLLACTOFRN in the last 72 hours.  Studies/Results: No results found.  Medications:  Scheduled: . atorvastatin  10 mg Oral Daily  . chlorhexidine  15 mL Mouth Rinse BID  . Chlorhexidine Gluconate Cloth  6 each Topical Daily  . feeding supplement (GLUCERNA SHAKE)  237 mL Oral BID BM  .  insulin aspart  0-6 Units Subcutaneous TID WC  . mouth rinse  15 mL Mouth Rinse q12n4p  . pantoprazole (PROTONIX) IV  40 mg Intravenous Q12H   Continuous: . lactated ringers 20 mL/hr at 11/15/19 1353    Assessment/Plan: 1) DU with bleeding visible vessel. 2) Anemia. 3) Hematochezia.   Nursing reports that she just had a large bloody bowel movement.  She also had a bloody bowel movement between yesterday and this AM.  Her HGB dropped, but she remains hemodynamically stable.  Hemospray was used as it was felt to be too high risk to cauterize the large visible vessel, ie, precipitating a life-threatening bleed.  It does not appear that hemospray is controlling her bleeding.  Plan: 1) Consult IR for embolization. 2) Follow HGB and transfuse as necessary.  LOS: 10 days   Zacharee Gaddie D 11/16/2019, 10:37 AM

## 2019-11-16 NOTE — Progress Notes (Signed)
Nephrology Follow-Up Consult note   Summary: [name] [name] is a/an 64 y.o. female with a past medical history significant for DM 2, morbid obesity, HTN, neuropathy, CKD, admitted for altered mental status.    Problems: Non-Oliguric AKI on CKD: b/l creat 1.5- 1.8, eGFR 33- 38 ml/min. ATN due to diuretics, hypotension, UTI and poor po intake.  CT scan showed no obstruction.  UA with pyuria but also numerous bacteria.  Admit creat was 6.2 on 9/14, creat peaked at 7.15 on 9/17, has been dropping 6.4 > 5.9 > 5.27 > 4.2 > 3.7 > 3.4 > 3.3 today - stopped IV lasix this am - pt needs embolization procedure (including iodinated contrast) by IR, have d/w IR MD and approved proceeding given life-threatening nature of the situation - f/u creat in am  Volume - +leg edema, better, wt's coming down  GI bleed - acute, bleeding GU on EGD, per GI. Low BP's have resolved. Per pmd/ GI to go for embolization today.   Morbid obesity: Likely has contributed to CKD over time  Diabetes mellitus type 2: Management per primary.  Anemia: Hemoglobin 8.3.  Iron sat 7 and ferritin 9.  Status post Feraheme 1 dose repeat dose in 1 week.    Diarrhea: Being treated with Imodium by primary team.  Continue work-up per them.   [facility] 11/16/2019 5:00 PM  _________________________________________________________ Interval History/Subjective: needs IR procedure to stop upper GI bleed  Constitutional: Morbidly obese, no distress, lying flat, in no distress ENMT: ears and nose without scars or lesions, MMM CV: normal rate, 2+ diffuse bilat leg edema Respiratory: Lying flat, bilateral chest rise, normal work of breathing Gastrointestinal: soft, non-tender, no palpable masses or hernias Skin: no visible lesions or rashes Psych: alert, judgement/insight appropriate, appropriate mood and affect Neuro: no asterixis present  Medications:  Current Facility-Administered Medications   Medication Dose Route Frequency Provider Last Rate Last Admin  . 0.9 %  sodium chloride infusion (Manually program via Guardrails IV Fluids)   Intravenous Once Georgette Shell, MD      . 0.9 %  sodium chloride infusion   Intravenous Continuous Carol Ada, MD      . acetaminophen (TYLENOL) tablet 650 mg  650 mg Oral Q6H PRN Carol Ada, MD   650 mg at 11/07/19 1343   Or  . acetaminophen (TYLENOL) suppository 650 mg  650 mg Rectal Q6H PRN Carol Ada, MD      . albuterol (PROVENTIL) (2.5 MG/3ML) 0.083% nebulizer solution 2.5 mg  2.5 mg Nebulization Q2H PRN Carol Ada, MD      . atorvastatin (LIPITOR) tablet 10 mg  10 mg Oral Daily Carol Ada, MD   10 mg at 11/16/19 0940  . chlorhexidine (PERIDEX) 0.12 % solution 15 mL  15 mL Mouth Rinse BID Carol Ada, MD   15 mL at 11/16/19 0939  . Chlorhexidine Gluconate Cloth 2 % PADS 6 each  6 each Topical Daily Carol Ada, MD   6 each at 11/16/19 1000  . feeding supplement (GLUCERNA SHAKE) (GLUCERNA SHAKE) liquid 237 mL  237 mL Oral BID BM Carol Ada, MD   237 mL at 11/11/19 1347  . fentaNYL (SUBLIMAZE) 100 MCG/2ML injection           . haloperidol lactate (HALDOL) injection 2 mg  2 mg Intravenous Q6H PRN Carol Ada, MD   2 mg at 11/11/19 2139  . HYDROcodone-acetaminophen (NORCO/VICODIN) 5-325 MG per tablet 1 tablet  1 tablet Oral Q8H  PRN Carol Ada, MD   1 tablet at 11/16/19 0003  . hydrOXYzine (ATARAX/VISTARIL) tablet 50 mg  50 mg Oral TID PRN Carol Ada, MD   50 mg at 11/16/19 0003  . insulin aspart (novoLOG) injection 0-6 Units  0-6 Units Subcutaneous TID WC Carol Ada, MD      . lactated ringers infusion   Intravenous Continuous Carol Ada, MD 20 mL/hr at 11/15/19 1353 New Bag at 11/15/19 1353  . lidocaine (XYLOCAINE) 1 % (with pres) injection           . lip balm (CARMEX) ointment   Topical PRN Carol Ada, MD      . loperamide (IMODIUM) capsule 2 mg  2 mg Oral Q6H PRN Carol Ada, MD   2 mg at 11/10/19 1325   . MEDLINE mouth rinse  15 mL Mouth Rinse q12n4p Carol Ada, MD   15 mL at 11/16/19 1300  . midazolam (VERSED) 2 MG/2ML injection           . ondansetron (ZOFRAN) injection 4 mg  4 mg Intravenous Q6H PRN Carol Ada, MD   4 mg at 11/16/19 1604  . pantoprazole (PROTONIX) injection 40 mg  40 mg Intravenous Q12H Carol Ada, MD   40 mg at 11/16/19 0940  . polyvinyl alcohol (LIQUIFILM TEARS) 1.4 % ophthalmic solution 1 drop  1 drop Both Eyes PRN Georgette Shell, MD      . sodium chloride flush (NS) 0.9 % injection 10-40 mL  10-40 mL Intracatheter PRN Carol Ada, MD          Review of Systems: 10 systems reviewed and negative except per interval history/subjective  Physical Exam: Vitals:   11/16/19 1530 11/16/19 1600  BP: (!) 152/80 (!) 142/49  Pulse: 78 76  Resp: (!) 23 18  Temp:  98.8 F (37.1 C)  SpO2: 96% 98%   Total I/O In: 440 [I.V.:440] Out: 900 [Urine:900]  Intake/Output Summary (Last 24 hours) at 11/16/2019 1700 Last data filed at 11/16/2019 1300 Gross per 24 hour  Intake 440 ml  Output 2900 ml  Net -2460 ml      Test Results I personally reviewed new and old clinical labs and radiology tests Lab Results  Component Value Date   NA 147 (H) 11/16/2019   K 3.8 11/16/2019   CL 114 (H) 11/16/2019   CO2 22 11/16/2019   BUN 58 (H) 11/16/2019   CREATININE 3.31 (H) 11/16/2019   CALCIUM 8.5 (L) 11/16/2019   ALBUMIN 2.0 (L) 11/13/2019   PHOS 4.2 11/13/2019

## 2019-11-16 NOTE — Sedation Documentation (Signed)
Groin site assessed.  No drainage noted.  Soft upon palpation.  Dressing is clean,dry and intact. Intact pulses distally via doppler

## 2019-11-16 NOTE — Sedation Documentation (Signed)
First site vascular assessment- Right Femoral  Direct pressure to groin site in place x 10 min

## 2019-11-17 LAB — CBC
HCT: 26.8 % — ABNORMAL LOW (ref 36.0–46.0)
Hemoglobin: 8.7 g/dL — ABNORMAL LOW (ref 12.0–15.0)
MCH: 28.4 pg (ref 26.0–34.0)
MCHC: 32.5 g/dL (ref 30.0–36.0)
MCV: 87.6 fL (ref 80.0–100.0)
Platelets: 308 10*3/uL (ref 150–400)
RBC: 3.06 MIL/uL — ABNORMAL LOW (ref 3.87–5.11)
RDW: 18.6 % — ABNORMAL HIGH (ref 11.5–15.5)
WBC: 10.3 10*3/uL (ref 4.0–10.5)
nRBC: 0.2 % (ref 0.0–0.2)

## 2019-11-17 LAB — BASIC METABOLIC PANEL
Anion gap: 6 (ref 5–15)
BUN: 46 mg/dL — ABNORMAL HIGH (ref 8–23)
CO2: 22 mmol/L (ref 22–32)
Calcium: 8 mg/dL — ABNORMAL LOW (ref 8.9–10.3)
Chloride: 114 mmol/L — ABNORMAL HIGH (ref 98–111)
Creatinine, Ser: 2.64 mg/dL — ABNORMAL HIGH (ref 0.44–1.00)
GFR calc Af Amer: 21 mL/min — ABNORMAL LOW (ref >60)
GFR calc non Af Amer: 18 mL/min — ABNORMAL LOW (ref >60)
Glucose, Bld: 100 mg/dL — ABNORMAL HIGH (ref 70–99)
Potassium: 3.4 mmol/L — ABNORMAL LOW (ref 3.5–5.1)
Sodium: 142 mmol/L (ref 135–145)

## 2019-11-17 LAB — TYPE AND SCREEN
ABO/RH(D): B POS
Antibody Screen: NEGATIVE
Unit division: 0

## 2019-11-17 LAB — GLUCOSE, CAPILLARY
Glucose-Capillary: 86 mg/dL (ref 70–99)
Glucose-Capillary: 66 mg/dL — ABNORMAL LOW (ref 70–99)
Glucose-Capillary: 50 mg/dL — ABNORMAL LOW (ref 70–99)
Glucose-Capillary: 94 mg/dL (ref 70–99)
Glucose-Capillary: 114 mg/dL — ABNORMAL HIGH (ref 70–99)
Glucose-Capillary: 106 mg/dL — ABNORMAL HIGH (ref 70–99)

## 2019-11-17 LAB — BPAM RBC
Blood Product Expiration Date: 202110232359
ISSUE DATE / TIME: 202109251720
Unit Type and Rh: 7300

## 2019-11-17 MED ORDER — DEXTROSE 50 % IV SOLN
INTRAVENOUS | Status: AC
  Administered 2019-11-17: 50 mL
  Filled 2019-11-17: qty 50

## 2019-11-17 MED ORDER — ALPRAZOLAM 0.5 MG PO TABS
0.5000 mg | ORAL_TABLET | Freq: Three times a day (TID) | ORAL | Status: DC | PRN
  Administered 2019-11-17 – 2019-11-23 (×11): 0.5 mg via ORAL
  Filled 2019-11-17 (×12): qty 1

## 2019-11-17 MED ORDER — SUCRALFATE 1 G PO TABS
1.0000 g | ORAL_TABLET | Freq: Three times a day (TID) | ORAL | Status: DC
  Administered 2019-11-17 – 2019-11-23 (×17): 1 g via ORAL
  Filled 2019-11-17 (×19): qty 1

## 2019-11-17 MED ORDER — ALUM & MAG HYDROXIDE-SIMETH 200-200-20 MG/5ML PO SUSP: 30.0000 mL | Freq: Four times a day (QID) | ORAL | Status: DC | PRN

## 2019-11-17 MED ORDER — DEXTROSE 50 % IV SOLN: 25.0000 g | INTRAVENOUS | Status: AC

## 2019-11-17 NOTE — Progress Notes (Signed)
PROGRESS NOTE    NAKINA SPATZ  TKP:[id number] DOB: [birth year] DOA: 11/05/2019 PCP: [name], MD    Brief Narrative:64 year old female with super morbid obesity, hypertension, type 2 diabetes, stage IV chronic kidney disease with baseline creatinine about 1.7, recent left leg injury and fracture recovering at the skilled nursing facility brought to the ER on 9/14 for confusion.  Patient was reportedly unresponsive to verbal stimuli and subsequently was incoherent.  In the emergency room, she improved spontaneously and responded to questions appropriately.  Patient is on multiple medications including narcotics and benzodiazepine.  Admitted with mild acute metabolic encephalopathy and acute renal failure. Patient continued to have diarrhea 11/15/2019 on the evening of 11/10/2019 patient started having bloody stools with frank bleeding per rectum with drop in hemoglobin she received 6 unit of packed RBC at that time she had colonoscopy done on 11/14/2019 showed diverticuli in the ascending colon otherwise with normal exam however it was not an optimal study.  Patient continues with slow drifting down of hemoglobin received on the unit of packed RBC on 11/14/2019 so far she has received a total of 8 units of packed RBC.   On 11/16/2019 patient had GDA embolization by interventional radiology for ongoing GI bleed. Assessment & Plan:   Principal Problem:   Acute renal failure (HCC) Active Problems:   Type 2 diabetes mellitus with stage 4 chronic kidney disease (HCC)   Metabolic encephalopathy   CKD (chronic kidney disease) stage 4, GFR 15-29 ml/min (HCC)   Pressure injury of skin   Morbid obesity (HCC)   GI bleeding   Encephalopathy acute   Vaginal discharge   Anemia associated with acute blood loss   #1 AKI with CKD stage IV this is thought to be due to multifactorial causes including hypotension multiple medications including chlorthalidone.  It appears her creatinine is improving  after Lasix challenge.  Nephrology was consulted they signed off on 11/17/2019.  Her creatinine has improved to 2.64 from 6.4 on admission.  Now she gets as needed Lasix.  Lasix was stopped due to hypotension.  She is positive by 6 L down from 9.5 L yesterday.  .  #2  Acute on chronic anemia secondary to upper GI bleed she received a total of 8  units of packed RBC.  Patient  had colonoscopy 9/23 no evidence of active bleeding was noted. egd 9/24 -normal esophagus, red blood in the gastric body and in the antrum and pylorus, adherent clot duodenal ulcer injected with hemostatic spray.  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  Hemoglobin 8.7 down from 9.6 patient has rectal tube with black stools.  #3 acute metabolic encephalopathy seems to have resolved, this is thought to be likely related to polypharmacy.  Prior to admission patient was on Percocet hydroxyzine Cymbalta Neurontin and Xanax at the nursing home.  These medications have been on hold mostly except for low-dose Percocet and intermittent Atarax that have been restarted.  She is awake and alert today and answers all my questions appropriately.  #4 type 2 diabetes with hypoglycemic episodes on SSI. CBG (last 3)  Recent Labs    11/16/19 2133 11/17/19 0827 11/17/19 1210  GLUCAP 86 86 66*    #5 status post UTI treated with Rocephin for 3 days.   Estimated body mass index is 75.9 kg/m as calculated from the following:   Height as of this encounter: 5\' 2"  (1.575 m).   Weight as of this encounter: 188.2 kg.  DVT prophylaxis: SCD  code Status: Full code Family Communication: None at bedside Disposition Plan:  Status is: Inpatient  Dispo:  Patient From: [facility]  Planned Disposition: [facility] patient came from SNF however she does not want to go back to the SNF that she came from.  Expected discharge date: Unknown  medically stable for discharge: No still being  monitored for GI bleed and AKI with daily labs  Consultants: GI nephrology   Procedures: A-line, subclavian line Antimicrobials: Anti-infectives (From admission, onward)   Start     Dose/Rate Route Frequency Ordered Stop   11/06/19 0600  metroNIDAZOLE (FLAGYL) IVPB 500 mg  Status:  Discontinued        500 mg 100 mL/hr over 60 Minutes Intravenous Every 8 hours 11/06/19 0417 11/11/19 1012   11/06/19 0500  cefTRIAXone (ROCEPHIN) 1 g in sodium chloride 0.9 % 100 mL IVPB  Status:  Discontinued        1 g 200 mL/hr over 30 Minutes Intravenous Every 24 hours 11/06/19 0417 11/09/19 1029       Subjective:  Patient is resting in bed she is awake and alert and very anxious.  She has not slept for many days.  She is requesting something for anxiety.  She reports a severe PTSD.  She does not want to go back to the nursing home that she came from.  Case management aware. Objective: Vitals:   11/17/19 0600 11/17/19 0800 11/17/19 0900 11/17/19 1000  BP: (!) 125/37 (!) 118/39 (!) 120/51 (!) 105/34  Pulse:      Resp: (!) 26 20 (!) 23 16  Temp:  98.5 F (36.9 C)    TempSrc:  Oral    SpO2:      Weight:      Height:        Intake/Output Summary (Last 24 hours) at 11/17/2019 1222 Last data filed at 11/17/2019 0600 Gross per 24 hour  Intake 119.99 ml  Output 1560 ml  Net -1440.01 ml   Filed Weights   11/15/19 0500 11/16/19 0500 11/17/19 0500  Weight: (!) 188.2 kg (!) 189 kg (!) 188.2 kg    Examination- General exam: Appears calm and comfortable  Respiratory system: Clear to auscultation. Respiratory effort normal. Cardiovascular system: S1 & S2 heard, RRR. No JVD, murmurs, rubs, gallops or clicks. No pedal edema. Gastrointestinal system: Abdomen is distended, soft and nontender. No organomegaly or masses felt. Normal bowel sounds heard. Central nervous system: Alert and oriented. No focal neurological deficits. Extremities: 2+ pitting edema Skin: No rashes, lesions or  ulcers Psychiatry: Judgement and insight appear normal. Mood & affect appropriate.     Data Reviewed: I have personally reviewed following labs and imaging studies  CBC: Recent Labs  Lab 11/11/19 0220 11/11/19 1159 11/14/19 1903 11/15/19 0509 11/16/19 0430 11/16/19 2200 11/17/19 0500  WBC 8.8  --   --   --  8.2  --  10.3  HGB 5.6*   < > 8.5* 8.3* 7.7* 9.6* 8.7*  HCT 18.7*   < > 26.4* 25.2* 24.3* 29.1* 26.8*  MCV 84.6  --   --   --  87.7  --  87.6  PLT 453*  --   --   --  281  --  308   < > = values in this interval not displayed.   Basic Metabolic Panel: Recent Labs  Lab 11/11/19 0220 11/11/19 0220 11/12/19 0255 11/12/19 0255 11/13/19 0257 11/14/19 0240 11/15/19 0509 11/16/19 0430 11/17/19 0500  NA 143   < >  147*   < > 140 143 144 147* 142  K 4.6   < > 4.3   < > 4.2 3.9 4.0 3.8 3.4*  CL 113*   < > 116*   < > 116* 117* 115* 114* 114*  CO2 21*   < > 19*   < > 18* 16* 21* 22 22  GLUCOSE 129*   < > 119*   < > 145* 86 87 85 100*  BUN 70*   < > 82*   < > 77* 66* 62* 58* 46*  CREATININE 5.99*   < > 5.27*   < > 4.27* 3.77* 3.49* 3.31* 2.64*  CALCIUM 7.9*   < > 8.4*   < > 7.7* 7.7* 8.3* 8.5* 8.0*  PHOS 6.6*  --  5.6*  --  4.2  --   --   --   --    < > = values in this interval not displayed.   GFR: Estimated Creatinine Clearance: 34.8 mL/min (A) (by C-G formula based on SCr of 2.64 mg/dL (H)). Liver Function Tests: Recent Labs  Lab 11/11/19 0220 11/12/19 0255 11/13/19 0257  ALBUMIN 2.0* 2.1* 2.0*   No results for input(s): LIPASE, AMYLASE in the last 168 hours. No results for input(s): AMMONIA in the last 168 hours. Coagulation Profile: No results for input(s): INR, PROTIME in the last 168 hours. Cardiac Enzymes: No results for input(s): CKTOTAL, CKMB, CKMBINDEX, TROPONINI in the last 168 hours. BNP (last 3 results) No results for input(s): PROBNP in the last 8760 hours. HbA1C: No results for input(s): HGBA1C in the last 72 hours. CBG: Recent Labs  Lab  11/16/19 1238 11/16/19 1650 11/16/19 2133 11/17/19 0827 11/17/19 1210  GLUCAP 65* 82 86 86 66*   Lipid Profile: No results for input(s): CHOL, HDL, LDLCALC, TRIG, CHOLHDL, LDLDIRECT in the last 72 hours. Thyroid Function Tests: No results for input(s): TSH, T4TOTAL, FREET4, T3FREE, THYROIDAB in the last 72 hours. Anemia Panel: No results for input(s): VITAMINB12, FOLATE, FERRITIN, TIBC, IRON, RETICCTPCT in the last 72 hours. Sepsis Labs: No results for input(s): PROCALCITON, LATICACIDVEN in the last 168 hours.  Recent Results (from the past 240 hour(s))  Culture, Urine     Status: Abnormal   Collection Time: 11/08/19  1:49 PM   Specimen: Urine, Catheterized  Result Value Ref Range Status   Specimen Description   Final    URINE, CATHETERIZED Performed at [facility] [address], [place], [place] [address]    Special Requests   Final    NONE Performed at [facility], [place] [address], [place], [place] [address]    Culture MULTIPLE SPECIES PRESENT, SUGGEST RECOLLECTION (A)  Final   Report Status 11/09/2019 FINAL  Final  Culture, Urine     Status: Abnormal   Collection Time: 11/09/19 12:04 PM   Specimen: Urine, Random  Result Value Ref Range Status   Specimen Description   Final    URINE, RANDOM Performed at [facility] [address], [place], [place] [address]    Special Requests   Final    NONE Performed at [facility], [place] [address], [place], [place] [address]    Culture (A)  Final    <10,000 COLONIES/mL INSIGNIFICANT GROWTH Performed at [facility] [address], [place],  [address]    Report Status 11/10/2019 FINAL  Final  MRSA PCR Screening     Status: None   Collection Time: 11/14/19  6:30 PM   Specimen: Nasopharyngeal  Result Value Ref Range Status   MRSA by PCR NEGATIVE NEGATIVE Final    Comment:        The GeneXpert MRSA Assay (FDA approved for NASAL  specimens only), is one component of a comprehensive MRSA colonization surveillance program. It is not intended to diagnose MRSA infection nor to guide or monitor treatment for MRSA infections. Performed at [facility], [place] [address] [place], [place] [address]          Radiology Studies: IR Angiogram Visceral Selective  Result Date: 11/16/2019 INDICATION: Bleeding duodenal ulcer by endoscopy, blood-loss anemia EXAM: ULTRASOUND GUIDANCE FOR VASCULAR ACCESS CELIAC AND GDA CATHETERIZATIONS AND ANGIOGRAMS GDA MICRO COIL EMBOLIZATION MEDICATIONS: 1% lidocaine local. ANESTHESIA/SEDATION: Moderate (conscious) sedation was employed during this procedure. A total of Versed 1.5 mg and Fentanyl 50 mcg was administered intravenously. Moderate Sedation Time: 40 minutes. The patient's level of consciousness and vital signs were monitored continuously by radiology nursing throughout the procedure under my direct supervision. CONTRAST:  70 cc omni 300 FLUOROSCOPY TIME:  Fluoroscopy Time: 9 minutes 0 seconds (3,066 mGy). COMPLICATIONS: None immediate. PROCEDURE: Informed consent was obtained from the patient following explanation of the procedure, risks, benefits and alternatives. The patient understands, agrees and consents for the procedure. All questions were addressed. A time out was performed prior to the initiation of the procedure. Maximal barrier sterile technique utilized including caps, mask, sterile gowns, sterile gloves, large sterile drape, hand hygiene, and Betadine prep. Under sterile conditions and local anesthesia, ultrasound micropuncture access performed of the right common femoral artery. Images obtained for documentation. Five French sheath inserted over a guidewire. C2 catheter advanced and utilized to select the celiac origin. Celiac angiogram performed. Celiac: The celiac origin is widely patent. The splenic, left gastric, hepatic vasculature, and GDA are all  visualized and patent. No active arterial GI bleeding demonstrated. However there are dilated branches within the pancreaticoduodenal arcade which are suspicious for sites of bleeding given the known large duodenal ulcer. Renegade STC microcatheter and a GT single angle Glidewire were utilized to advance the access into the GDA. Selective GDA angiogram performed. GDA: The GDA and gastroepiploic vasculature are all patent. Again, no active arterial bleeding noted but there is 1 dilated tortuous branch from the lower GDA which is suspicious for a bleeding source. GDA micro coil embolization: Microcatheter was advanced to the dilated pancreaticoduodenal branch. 4 and 5 mm interlock micro coils were deployed for embolization. Following embolization, the GDA is occluded. Repeat celiac angiogram confirms preserved patency of the hepatic vasculature and GDA occlusion. Catheter removed. Right common femoral artery access closed with the ExoSeal device and manual compression. No immediate complication. Patient tolerated the procedure well. IMPRESSION: Dilated branches of the pancreaticoduodenal arcade noted off of the mid to distal GDA. Although no active arterial bleeding demonstrated preventative GDA micro coil embolization performed as detailed above for the known large duodenal ulcer by endoscopy. Electronically Signed   By: Jerilynn Mages.  Shick M.D.   On: 11/16/2019 16:11   IR Angiogram Selective Each Additional Vessel  Result Date: 11/16/2019 INDICATION: Bleeding duodenal ulcer by endoscopy, blood-loss anemia EXAM: ULTRASOUND GUIDANCE FOR VASCULAR ACCESS CELIAC AND GDA CATHETERIZATIONS AND ANGIOGRAMS GDA MICRO COIL EMBOLIZATION MEDICATIONS: 1% lidocaine local. ANESTHESIA/SEDATION: Moderate (conscious) sedation was employed during this procedure. A total of Versed 1.5 mg and Fentanyl 50 mcg was administered intravenously. Moderate Sedation Time: 40 minutes. The patient's level of consciousness and vital signs were monitored  continuously by  radiology nursing throughout the procedure under my direct supervision. CONTRAST:  70 cc omni 300 FLUOROSCOPY TIME:  Fluoroscopy Time: 9 minutes 0 seconds (3,066 mGy). COMPLICATIONS: None immediate. PROCEDURE: Informed consent was obtained from the patient following explanation of the procedure, risks, benefits and alternatives. The patient understands, agrees and consents for the procedure. All questions were addressed. A time out was performed prior to the initiation of the procedure. Maximal barrier sterile technique utilized including caps, mask, sterile gowns, sterile gloves, large sterile drape, hand hygiene, and Betadine prep. Under sterile conditions and local anesthesia, ultrasound micropuncture access performed of the right common femoral artery. Images obtained for documentation. Five French sheath inserted over a guidewire. C2 catheter advanced and utilized to select the celiac origin. Celiac angiogram performed. Celiac: The celiac origin is widely patent. The splenic, left gastric, hepatic vasculature, and GDA are all visualized and patent. No active arterial GI bleeding demonstrated. However there are dilated branches within the pancreaticoduodenal arcade which are suspicious for sites of bleeding given the known large duodenal ulcer. Renegade STC microcatheter and a GT single angle Glidewire were utilized to advance the access into the GDA. Selective GDA angiogram performed. GDA: The GDA and gastroepiploic vasculature are all patent. Again, no active arterial bleeding noted but there is 1 dilated tortuous branch from the lower GDA which is suspicious for a bleeding source. GDA micro coil embolization: Microcatheter was advanced to the dilated pancreaticoduodenal branch. 4 and 5 mm interlock micro coils were deployed for embolization. Following embolization, the GDA is occluded. Repeat celiac angiogram confirms preserved patency of the hepatic vasculature and GDA occlusion. Catheter  removed. Right common femoral artery access closed with the ExoSeal device and manual compression. No immediate complication. Patient tolerated the procedure well. IMPRESSION: Dilated branches of the pancreaticoduodenal arcade noted off of the mid to distal GDA. Although no active arterial bleeding demonstrated preventative GDA micro coil embolization performed as detailed above for the known large duodenal ulcer by endoscopy. Electronically Signed   By: Jerilynn Mages.  Shick M.D.   On: 11/16/2019 16:11   IR US Guide Vasc Access Right  Result Date: 11/16/2019 INDICATION: Bleeding duodenal ulcer by endoscopy, blood-loss anemia EXAM: ULTRASOUND GUIDANCE FOR VASCULAR ACCESS CELIAC AND GDA CATHETERIZATIONS AND ANGIOGRAMS GDA MICRO COIL EMBOLIZATION MEDICATIONS: 1% lidocaine local. ANESTHESIA/SEDATION: Moderate (conscious) sedation was employed during this procedure. A total of Versed 1.5 mg and Fentanyl 50 mcg was administered intravenously. Moderate Sedation Time: 40 minutes. The patient's level of consciousness and vital signs were monitored continuously by radiology nursing throughout the procedure under my direct supervision. CONTRAST:  70 cc omni 300 FLUOROSCOPY TIME:  Fluoroscopy Time: 9 minutes 0 seconds (3,066 mGy). COMPLICATIONS: None immediate. PROCEDURE: Informed consent was obtained from the patient following explanation of the procedure, risks, benefits and alternatives. The patient understands, agrees and consents for the procedure. All questions were addressed. A time out was performed prior to the initiation of the procedure. Maximal barrier sterile technique utilized including caps, mask, sterile gowns, sterile gloves, large sterile drape, hand hygiene, and Betadine prep. Under sterile conditions and local anesthesia, ultrasound micropuncture access performed of the right common femoral artery. Images obtained for documentation. Five French sheath inserted over a guidewire. C2 catheter advanced and utilized to  select the celiac origin. Celiac angiogram performed. Celiac: The celiac origin is widely patent. The splenic, left gastric, hepatic vasculature, and GDA are all visualized and patent. No active arterial GI bleeding demonstrated. However there are dilated branches within the pancreaticoduodenal arcade which are  suspicious for sites of bleeding given the known large duodenal ulcer. Renegade STC microcatheter and a GT single angle Glidewire were utilized to advance the access into the GDA. Selective GDA angiogram performed. GDA: The GDA and gastroepiploic vasculature are all patent. Again, no active arterial bleeding noted but there is 1 dilated tortuous branch from the lower GDA which is suspicious for a bleeding source. GDA micro coil embolization: Microcatheter was advanced to the dilated pancreaticoduodenal branch. 4 and 5 mm interlock micro coils were deployed for embolization. Following embolization, the GDA is occluded. Repeat celiac angiogram confirms preserved patency of the hepatic vasculature and GDA occlusion. Catheter removed. Right common femoral artery access closed with the ExoSeal device and manual compression. No immediate complication. Patient tolerated the procedure well. IMPRESSION: Dilated branches of the pancreaticoduodenal arcade noted off of the mid to distal GDA. Although no active arterial bleeding demonstrated preventative GDA micro coil embolization performed as detailed above for the known large duodenal ulcer by endoscopy. Electronically Signed   By: Jerilynn Mages.  Shick M.D.   On: 11/16/2019 16:11   IR EMBO ART  VEN HEMORR LYMPH EXTRAV  INC GUIDE ROADMAPPING  Result Date: 11/16/2019 INDICATION: Bleeding duodenal ulcer by endoscopy, blood-loss anemia EXAM: ULTRASOUND GUIDANCE FOR VASCULAR ACCESS CELIAC AND GDA CATHETERIZATIONS AND ANGIOGRAMS GDA MICRO COIL EMBOLIZATION MEDICATIONS: 1% lidocaine local. ANESTHESIA/SEDATION: Moderate (conscious) sedation was employed during this procedure. A total  of Versed 1.5 mg and Fentanyl 50 mcg was administered intravenously. Moderate Sedation Time: 40 minutes. The patient's level of consciousness and vital signs were monitored continuously by radiology nursing throughout the procedure under my direct supervision. CONTRAST:  70 cc omni 300 FLUOROSCOPY TIME:  Fluoroscopy Time: 9 minutes 0 seconds (3,066 mGy). COMPLICATIONS: None immediate. PROCEDURE: Informed consent was obtained from the patient following explanation of the procedure, risks, benefits and alternatives. The patient understands, agrees and consents for the procedure. All questions were addressed. A time out was performed prior to the initiation of the procedure. Maximal barrier sterile technique utilized including caps, mask, sterile gowns, sterile gloves, large sterile drape, hand hygiene, and Betadine prep. Under sterile conditions and local anesthesia, ultrasound micropuncture access performed of the right common femoral artery. Images obtained for documentation. Five French sheath inserted over a guidewire. C2 catheter advanced and utilized to select the celiac origin. Celiac angiogram performed. Celiac: The celiac origin is widely patent. The splenic, left gastric, hepatic vasculature, and GDA are all visualized and patent. No active arterial GI bleeding demonstrated. However there are dilated branches within the pancreaticoduodenal arcade which are suspicious for sites of bleeding given the known large duodenal ulcer. Renegade STC microcatheter and a GT single angle Glidewire were utilized to advance the access into the GDA. Selective GDA angiogram performed. GDA: The GDA and gastroepiploic vasculature are all patent. Again, no active arterial bleeding noted but there is 1 dilated tortuous branch from the lower GDA which is suspicious for a bleeding source. GDA micro coil embolization: Microcatheter was advanced to the dilated pancreaticoduodenal branch. 4 and 5 mm interlock micro coils were deployed  for embolization. Following embolization, the GDA is occluded. Repeat celiac angiogram confirms preserved patency of the hepatic vasculature and GDA occlusion. Catheter removed. Right common femoral artery access closed with the ExoSeal device and manual compression. No immediate complication. Patient tolerated the procedure well. IMPRESSION: Dilated branches of the pancreaticoduodenal arcade noted off of the mid to distal GDA. Although no active arterial bleeding demonstrated preventative GDA micro coil embolization performed as detailed above for  the known large duodenal ulcer by endoscopy. Electronically Signed   By: Jerilynn Mages.  Shick M.D.   On: 11/16/2019 16:11        Scheduled Meds: . atorvastatin  10 mg Oral Daily  . chlorhexidine  15 mL Mouth Rinse BID  . Chlorhexidine Gluconate Cloth  6 each Topical Daily  . feeding supplement (GLUCERNA SHAKE)  237 mL Oral BID BM  . insulin aspart  0-6 Units Subcutaneous TID WC  . mouth rinse  15 mL Mouth Rinse q12n4p  . pantoprazole (PROTONIX) IV  40 mg Intravenous Q12H   Continuous Infusions: . sodium chloride 20 mL/hr at 11/16/19 1730  . lactated ringers Stopped (11/16/19 2000)     LOS: 11 days     Georgette Shell, MD 11/17/2019, 12:22 PM

## 2019-11-17 NOTE — Progress Notes (Signed)
Nephrology Follow-Up note   Summary: [name] [name] is a/an 64 y.o. female with a past medical history significant for DM 2, morbid obesity, HTN, neuropathy, CKD, admitted for altered mental status.    Subjective: went for embolization procedure by IR yesterday, GDA was embolized. No lytes this am. Hb stable 8's.    Assessment / Plan: #AoCKD 3b: b/l creat 1.5- 1.8, eGFR 33- 38 ml/min. ATN due to diuretics, hypotension, UTI and poor po intake.  CT scan showed no obstruction.  UA with pyuria but also numerous bacteria.  Admit creat  6.2 on 9/14, peaked at 7.15 on 9/17, since then 6.4 > 5.9 > 5.27 > 4.2 > 3.7 > 3.4 > 3.3 > 2.6 today. Good UOP.  - AKI resolving, will sign off   Volume - + sig LE edema. Have d/w pmd as to options.   GI bleed - acute, bleeding GU on EGD, per GI. Shock resolved, sp GDA embolization procedure per IR on 9/25.   Morbid obesity: Likely has contributed to CKD over time  Diabetes mellitus type 2: Management per primary.  Anemia: Hemoglobin 8.3.  Iron sat 7 and ferritin 9.  Status post Feraheme 1 dose repeat dose in 1 week.    Diarrhea: Being treated with Imodium by primary team.  Continue work-up per them.   [facility] 11/17/2019 6:48 AM  _________________________________________________________   Constitutional: Morbidly obese, no distress, lying flat, in no distress ENMT: ears and nose without scars or lesions, MMM CV: normal rate, 2+ diffuse bilat leg edema Respiratory: Lying flat, bilateral chest rise, normal work of breathing Gastrointestinal: soft, non-tender, no palpable masses or hernias Skin: no visible lesions or rashes Psych: alert, judgement/insight appropriate, appropriate mood and affect Neuro: no asterixis present  Medications:  Current Facility-Administered Medications  Medication Dose Route Frequency Provider Last Rate Last Admin  . 0.9 %  sodium chloride infusion   Intravenous Continuous Carol Ada, MD 20 mL/hr at 11/16/19 1730 Restarted at 11/16/19 1730  . acetaminophen (TYLENOL) tablet 650 mg  650 mg Oral Q6H PRN Carol Ada, MD   650 mg at 11/07/19 1343   Or  . acetaminophen (TYLENOL) suppository 650 mg  650 mg Rectal Q6H PRN Carol Ada, MD      . albuterol (PROVENTIL) (2.5 MG/3ML) 0.083% nebulizer solution 2.5 mg  2.5 mg Nebulization Q2H PRN Carol Ada, MD      . atorvastatin (LIPITOR) tablet 10 mg  10 mg Oral Daily Carol Ada, MD   10 mg at 11/16/19 0940  . chlorhexidine (PERIDEX) 0.12 % solution 15 mL  15 mL Mouth Rinse BID Carol Ada, MD   15 mL at 11/16/19 0939  . Chlorhexidine Gluconate Cloth 2 % PADS 6 each  6 each Topical Daily Carol Ada, MD   6 each at 11/16/19 1000  . diphenhydrAMINE (BENADRYL) capsule 25 mg  25 mg Oral Q6H PRN Georgette Shell, MD   25 mg at 11/16/19 2006  . feeding supplement (GLUCERNA SHAKE) (GLUCERNA SHAKE) liquid 237 mL  237 mL Oral BID BM Carol Ada, MD   237 mL at 11/11/19 1347  . haloperidol lactate (HALDOL) injection 2 mg  2 mg Intravenous Q6H PRN Carol Ada, MD   2 mg at 11/11/19 2139  . HYDROcodone-acetaminophen (NORCO/VICODIN) 5-325 MG per tablet 1 tablet  1 tablet Oral Q8H PRN Carol Ada, MD   1 tablet at 11/16/19 0003  . hydrOXYzine (ATARAX/VISTARIL) tablet 50 mg  50 mg Oral TID  PRN Carol Ada, MD   50 mg at 11/16/19 2006  . insulin aspart (novoLOG) injection 0-6 Units  0-6 Units Subcutaneous TID WC Carol Ada, MD      . lactated ringers infusion   Intravenous Continuous Carol Ada, MD 20 mL/hr at 11/15/19 1353 New Bag at 11/15/19 1353  . lip balm (CARMEX) ointment   Topical PRN Carol Ada, MD      . loperamide (IMODIUM) capsule 2 mg  2 mg Oral Q6H PRN Carol Ada, MD   2 mg at 11/10/19 1325  . MEDLINE mouth rinse  15 mL Mouth Rinse q12n4p Carol Ada, MD   15 mL at 11/16/19 1700  . ondansetron (ZOFRAN) injection 4 mg  4 mg Intravenous Q6H PRN Carol Ada, MD   4 mg at 11/16/19 1604  .  pantoprazole (PROTONIX) injection 40 mg  40 mg Intravenous Q12H Carol Ada, MD   40 mg at 11/16/19 2158  . polyvinyl alcohol (LIQUIFILM TEARS) 1.4 % ophthalmic solution 1 drop  1 drop Both Eyes PRN Georgette Shell, MD   1 drop at 11/16/19 1729  . sodium chloride flush (NS) 0.9 % injection 10-40 mL  10-40 mL Intracatheter PRN Carol Ada, MD          Review of Systems: 10 systems reviewed and negative except per interval history/subjective  Physical Exam: Vitals:   11/17/19 0500 11/17/19 0600  BP: (!) 117/37 (!) 125/37  Pulse:    Resp: 17 (!) 26  Temp:    SpO2:     Total I/O In: -  Out: 1150 [Urine:1100; Stool:50]  Intake/Output Summary (Last 24 hours) at 11/17/2019 5400 Last data filed at 11/17/2019 0600 Gross per 24 hour  Intake 559.99 ml  Output 2060 ml  Net -1500.01 ml      Test Results I personally reviewed new and old clinical labs and radiology tests Lab Results  Component Value Date   NA 147 (H) 11/16/2019   K 3.8 11/16/2019   CL 114 (H) 11/16/2019   CO2 22 11/16/2019   BUN 58 (H) 11/16/2019   CREATININE 3.31 (H) 11/16/2019   CALCIUM 8.5 (L) 11/16/2019   ALBUMIN 2.0 (L) 11/13/2019   PHOS 4.2 11/13/2019

## 2019-11-17 NOTE — Progress Notes (Signed)
Subjective: No complaints.  Feeling well.  Objective: Vital signs in last 24 hours: Temp:  [97.3 F (36.3 C)-98.8 F (37.1 C)] 98.5 F (36.9 C) (09/26 0800) Pulse Rate:  [76-78] 78 (09/25 1743) Resp:  [15-26] 16 (09/26 1000) BP: (102-152)/(32-112) 105/34 (09/26 1000) SpO2:  [96 %-100 %] 100 % (09/26 0400) Weight:  [188.2 kg] 188.2 kg (09/26 0500) Last BM Date: 11/17/19  Intake/Output from previous day: 09/25 0701 - 09/26 0700 In: 560 [I.V.:560] Out: 2060 [Urine:2000; Stool:60] Intake/Output this shift: No intake/output data recorded.  General appearance: alert and no distress GI: soft, non-tender; bowel sounds normal; no masses,  no organomegaly  Lab Results: Recent Labs    11/16/19 0430 11/16/19 2200 11/17/19 0500  WBC 8.2  --  10.3  HGB 7.7* 9.6* 8.7*  HCT 24.3* 29.1* 26.8*  PLT 281  --  308   BMET Recent Labs    11/15/19 0509 11/16/19 0430 11/17/19 0500  NA 144 147* 142  K 4.0 3.8 3.4*  CL 115* 114* 114*  CO2 21* 22 22  GLUCOSE 87 85 100*  BUN 62* 58* 46*  CREATININE 3.49* 3.31* 2.64*  CALCIUM 8.3* 8.5* 8.0*   LFT No results for input(s): PROT, ALBUMIN, AST, ALT, ALKPHOS, BILITOT, BILIDIR, IBILI in the last 72 hours. PT/INR No results for input(s): LABPROT, INR in the last 72 hours. Hepatitis Panel No results for input(s): HEPBSAG, HCVAB, HEPAIGM, HEPBIGM in the last 72 hours. C-Diff No results for input(s): CDIFFTOX in the last 72 hours. Fecal Lactopherrin No results for input(s): FECLLACTOFRN in the last 72 hours.  Studies/Results: IR Angiogram Visceral Selective  Result Date: 11/16/2019 INDICATION: Bleeding duodenal ulcer by endoscopy, blood-loss anemia EXAM: ULTRASOUND GUIDANCE FOR VASCULAR ACCESS CELIAC AND GDA CATHETERIZATIONS AND ANGIOGRAMS GDA MICRO COIL EMBOLIZATION MEDICATIONS: 1% lidocaine local. ANESTHESIA/SEDATION: Moderate (conscious) sedation was employed during this procedure. A total of Versed 1.5 mg and Fentanyl 50 mcg was  administered intravenously. Moderate Sedation Time: 40 minutes. The patient's level of consciousness and vital signs were monitored continuously by radiology nursing throughout the procedure under my direct supervision. CONTRAST:  70 cc omni 300 FLUOROSCOPY TIME:  Fluoroscopy Time: 9 minutes 0 seconds (3,066 mGy). COMPLICATIONS: None immediate. PROCEDURE: Informed consent was obtained from the patient following explanation of the procedure, risks, benefits and alternatives. The patient understands, agrees and consents for the procedure. All questions were addressed. A time out was performed prior to the initiation of the procedure. Maximal barrier sterile technique utilized including caps, mask, sterile gowns, sterile gloves, large sterile drape, hand hygiene, and Betadine prep. Under sterile conditions and local anesthesia, ultrasound micropuncture access performed of the right common femoral artery. Images obtained for documentation. Five French sheath inserted over a guidewire. C2 catheter advanced and utilized to select the celiac origin. Celiac angiogram performed. Celiac: The celiac origin is widely patent. The splenic, left gastric, hepatic vasculature, and GDA are all visualized and patent. No active arterial GI bleeding demonstrated. However there are dilated branches within the pancreaticoduodenal arcade which are suspicious for sites of bleeding given the known large duodenal ulcer. Renegade STC microcatheter and a GT single angle Glidewire were utilized to advance the access into the GDA. Selective GDA angiogram performed. GDA: The GDA and gastroepiploic vasculature are all patent. Again, no active arterial bleeding noted but there is 1 dilated tortuous branch from the lower GDA which is suspicious for a bleeding source. GDA micro coil embolization: Microcatheter was advanced to the dilated pancreaticoduodenal branch. 4 and 5 mm  interlock micro coils were deployed for embolization. Following  embolization, the GDA is occluded. Repeat celiac angiogram confirms preserved patency of the hepatic vasculature and GDA occlusion. Catheter removed. Right common femoral artery access closed with the ExoSeal device and manual compression. No immediate complication. Patient tolerated the procedure well. IMPRESSION: Dilated branches of the pancreaticoduodenal arcade noted off of the mid to distal GDA. Although no active arterial bleeding demonstrated preventative GDA micro coil embolization performed as detailed above for the known large duodenal ulcer by endoscopy. Electronically Signed   By: Jerilynn Mages.  Shick M.D.   On: 11/16/2019 16:11   IR Angiogram Selective Each Additional Vessel  Result Date: 11/16/2019 INDICATION: Bleeding duodenal ulcer by endoscopy, blood-loss anemia EXAM: ULTRASOUND GUIDANCE FOR VASCULAR ACCESS CELIAC AND GDA CATHETERIZATIONS AND ANGIOGRAMS GDA MICRO COIL EMBOLIZATION MEDICATIONS: 1% lidocaine local. ANESTHESIA/SEDATION: Moderate (conscious) sedation was employed during this procedure. A total of Versed 1.5 mg and Fentanyl 50 mcg was administered intravenously. Moderate Sedation Time: 40 minutes. The patient's level of consciousness and vital signs were monitored continuously by radiology nursing throughout the procedure under my direct supervision. CONTRAST:  70 cc omni 300 FLUOROSCOPY TIME:  Fluoroscopy Time: 9 minutes 0 seconds (3,066 mGy). COMPLICATIONS: None immediate. PROCEDURE: Informed consent was obtained from the patient following explanation of the procedure, risks, benefits and alternatives. The patient understands, agrees and consents for the procedure. All questions were addressed. A time out was performed prior to the initiation of the procedure. Maximal barrier sterile technique utilized including caps, mask, sterile gowns, sterile gloves, large sterile drape, hand hygiene, and Betadine prep. Under sterile conditions and local anesthesia, ultrasound micropuncture access  performed of the right common femoral artery. Images obtained for documentation. Five French sheath inserted over a guidewire. C2 catheter advanced and utilized to select the celiac origin. Celiac angiogram performed. Celiac: The celiac origin is widely patent. The splenic, left gastric, hepatic vasculature, and GDA are all visualized and patent. No active arterial GI bleeding demonstrated. However there are dilated branches within the pancreaticoduodenal arcade which are suspicious for sites of bleeding given the known large duodenal ulcer. Renegade STC microcatheter and a GT single angle Glidewire were utilized to advance the access into the GDA. Selective GDA angiogram performed. GDA: The GDA and gastroepiploic vasculature are all patent. Again, no active arterial bleeding noted but there is 1 dilated tortuous branch from the lower GDA which is suspicious for a bleeding source. GDA micro coil embolization: Microcatheter was advanced to the dilated pancreaticoduodenal branch. 4 and 5 mm interlock micro coils were deployed for embolization. Following embolization, the GDA is occluded. Repeat celiac angiogram confirms preserved patency of the hepatic vasculature and GDA occlusion. Catheter removed. Right common femoral artery access closed with the ExoSeal device and manual compression. No immediate complication. Patient tolerated the procedure well. IMPRESSION: Dilated branches of the pancreaticoduodenal arcade noted off of the mid to distal GDA. Although no active arterial bleeding demonstrated preventative GDA micro coil embolization performed as detailed above for the known large duodenal ulcer by endoscopy. Electronically Signed   By: Jerilynn Mages.  Shick M.D.   On: 11/16/2019 16:11   IR US Guide Vasc Access Right  Result Date: 11/16/2019 INDICATION: Bleeding duodenal ulcer by endoscopy, blood-loss anemia EXAM: ULTRASOUND GUIDANCE FOR VASCULAR ACCESS CELIAC AND GDA CATHETERIZATIONS AND ANGIOGRAMS GDA MICRO COIL  EMBOLIZATION MEDICATIONS: 1% lidocaine local. ANESTHESIA/SEDATION: Moderate (conscious) sedation was employed during this procedure. A total of Versed 1.5 mg and Fentanyl 50 mcg was administered intravenously. Moderate Sedation Time: 40  minutes. The patient's level of consciousness and vital signs were monitored continuously by radiology nursing throughout the procedure under my direct supervision. CONTRAST:  70 cc omni 300 FLUOROSCOPY TIME:  Fluoroscopy Time: 9 minutes 0 seconds (3,066 mGy). COMPLICATIONS: None immediate. PROCEDURE: Informed consent was obtained from the patient following explanation of the procedure, risks, benefits and alternatives. The patient understands, agrees and consents for the procedure. All questions were addressed. A time out was performed prior to the initiation of the procedure. Maximal barrier sterile technique utilized including caps, mask, sterile gowns, sterile gloves, large sterile drape, hand hygiene, and Betadine prep. Under sterile conditions and local anesthesia, ultrasound micropuncture access performed of the right common femoral artery. Images obtained for documentation. Five French sheath inserted over a guidewire. C2 catheter advanced and utilized to select the celiac origin. Celiac angiogram performed. Celiac: The celiac origin is widely patent. The splenic, left gastric, hepatic vasculature, and GDA are all visualized and patent. No active arterial GI bleeding demonstrated. However there are dilated branches within the pancreaticoduodenal arcade which are suspicious for sites of bleeding given the known large duodenal ulcer. Renegade STC microcatheter and a GT single angle Glidewire were utilized to advance the access into the GDA. Selective GDA angiogram performed. GDA: The GDA and gastroepiploic vasculature are all patent. Again, no active arterial bleeding noted but there is 1 dilated tortuous branch from the lower GDA which is suspicious for a bleeding source. GDA  micro coil embolization: Microcatheter was advanced to the dilated pancreaticoduodenal branch. 4 and 5 mm interlock micro coils were deployed for embolization. Following embolization, the GDA is occluded. Repeat celiac angiogram confirms preserved patency of the hepatic vasculature and GDA occlusion. Catheter removed. Right common femoral artery access closed with the ExoSeal device and manual compression. No immediate complication. Patient tolerated the procedure well. IMPRESSION: Dilated branches of the pancreaticoduodenal arcade noted off of the mid to distal GDA. Although no active arterial bleeding demonstrated preventative GDA micro coil embolization performed as detailed above for the known large duodenal ulcer by endoscopy. Electronically Signed   By: Jerilynn Mages.  Shick M.D.   On: 11/16/2019 16:11   IR EMBO ART  VEN HEMORR LYMPH EXTRAV  INC GUIDE ROADMAPPING  Result Date: 11/16/2019 INDICATION: Bleeding duodenal ulcer by endoscopy, blood-loss anemia EXAM: ULTRASOUND GUIDANCE FOR VASCULAR ACCESS CELIAC AND GDA CATHETERIZATIONS AND ANGIOGRAMS GDA MICRO COIL EMBOLIZATION MEDICATIONS: 1% lidocaine local. ANESTHESIA/SEDATION: Moderate (conscious) sedation was employed during this procedure. A total of Versed 1.5 mg and Fentanyl 50 mcg was administered intravenously. Moderate Sedation Time: 40 minutes. The patient's level of consciousness and vital signs were monitored continuously by radiology nursing throughout the procedure under my direct supervision. CONTRAST:  70 cc omni 300 FLUOROSCOPY TIME:  Fluoroscopy Time: 9 minutes 0 seconds (3,066 mGy). COMPLICATIONS: None immediate. PROCEDURE: Informed consent was obtained from the patient following explanation of the procedure, risks, benefits and alternatives. The patient understands, agrees and consents for the procedure. All questions were addressed. A time out was performed prior to the initiation of the procedure. Maximal barrier sterile technique utilized including  caps, mask, sterile gowns, sterile gloves, large sterile drape, hand hygiene, and Betadine prep. Under sterile conditions and local anesthesia, ultrasound micropuncture access performed of the right common femoral artery. Images obtained for documentation. Five French sheath inserted over a guidewire. C2 catheter advanced and utilized to select the celiac origin. Celiac angiogram performed. Celiac: The celiac origin is widely patent. The splenic, left gastric, hepatic vasculature, and GDA are all visualized  and patent. No active arterial GI bleeding demonstrated. However there are dilated branches within the pancreaticoduodenal arcade which are suspicious for sites of bleeding given the known large duodenal ulcer. Renegade STC microcatheter and a GT single angle Glidewire were utilized to advance the access into the GDA. Selective GDA angiogram performed. GDA: The GDA and gastroepiploic vasculature are all patent. Again, no active arterial bleeding noted but there is 1 dilated tortuous branch from the lower GDA which is suspicious for a bleeding source. GDA micro coil embolization: Microcatheter was advanced to the dilated pancreaticoduodenal branch. 4 and 5 mm interlock micro coils were deployed for embolization. Following embolization, the GDA is occluded. Repeat celiac angiogram confirms preserved patency of the hepatic vasculature and GDA occlusion. Catheter removed. Right common femoral artery access closed with the ExoSeal device and manual compression. No immediate complication. Patient tolerated the procedure well. IMPRESSION: Dilated branches of the pancreaticoduodenal arcade noted off of the mid to distal GDA. Although no active arterial bleeding demonstrated preventative GDA micro coil embolization performed as detailed above for the known large duodenal ulcer by endoscopy. Electronically Signed   By: Jerilynn Mages.  Shick M.D.   On: 11/16/2019 16:11    Medications:  Scheduled: . atorvastatin  10 mg Oral Daily   . chlorhexidine  15 mL Mouth Rinse BID  . Chlorhexidine Gluconate Cloth  6 each Topical Daily  . feeding supplement (GLUCERNA SHAKE)  237 mL Oral BID BM  . insulin aspart  0-6 Units Subcutaneous TID WC  . mouth rinse  15 mL Mouth Rinse q12n4p  . pantoprazole (PROTONIX) IV  40 mg Intravenous Q12H   Continuous: . sodium chloride 20 mL/hr at 11/16/19 1730  . 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 2) Anemia.   The stool in the rectal bag shows liquid black stool.  There is no evidence of any bright red blood.  Hopefully this is representative of old blood.  Her HGB fluctuates.  Plan: 1) Continue to follow HGB. 2) Transfuse as necessary. 3) Continue with pantoprazole. 4) Add sucralfate.  LOS: 11 days   Reis Goga D 11/17/2019, 3:16 PM

## 2019-11-17 NOTE — Progress Notes (Signed)
Patient hypoglycemic with blood sugar of 60. Patient is  asymptomatic at this time. Patient on clear liquid diet so sprite given. Patient unable to drink all of sprite recheck of blood glucose 50. Patient given  dextrose 25 gram blood glucose 94. Dr. Zigmund Daniel notified.

## 2019-11-17 NOTE — Progress Notes (Signed)
Referring Physician(s): Dr. 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 Vital signs are stable. Hgb with slight decrease compared to yesterday. Her bowel movements have slowed down per bedside RN.   Allergies: Morphine and Penicillins  Medications: Prior to Admission medications   Medication Sig Start Date End Date Taking? Authorizing Provider  allopurinol (ZYLOPRIM) 100 MG tablet Take 100 mg by mouth 2 (two) times daily.   Yes [provider]  ALPRAZolam Duanne Moron) 1 MG tablet Take 1 tablet (1 mg total) by mouth 3 (three) times daily as needed for anxiety. 10/29/19  Yes Lucrezia Starch, MD  atenolol-chlorthalidone (TENORETIC) 50-25 MG tablet Take 1 tablet by mouth daily.   Yes [provider]  atorvastatin (LIPITOR) 10 MG tablet Take 10 mg by mouth daily.   Yes [provider]  DULoxetine (CYMBALTA) 30 MG capsule Take 30 mg by mouth 2 (two) times daily.   Yes [provider]  gabapentin (NEURONTIN) 600 MG tablet Take 1,800 mg by mouth in the morning.  09/07/18  Yes [provider]  hydrOXYzine (ATARAX/VISTARIL) 50 MG tablet Take 50 mg by mouth every 8 (eight) hours as needed for anxiety or itching.  08/04/18  Yes [provider]  oxyCODONE-acetaminophen (PERCOCET) 10-325 MG tablet Take 1 tablet by mouth every 8 (eight) hours as needed for pain. 10/29/19  Yes Lucrezia Starch, MD  pioglitazone (ACTOS) 30 MG tablet Take 30 mg by mouth daily.   Yes [provider]  senna (SENOKOT) 8.6 MG TABS tablet Take 1 tablet (8.6 mg total) by mouth daily. 10/20/19  Yes Nita Sells, MD  sitaGLIPtin (JANUVIA) 100 MG tablet Take 100 mg by mouth daily.   Yes [provider]  sorbitol 70 % SOLN Take 30 mLs by mouth 2 (two) times daily. As a second choice for constipation  if you do not stool with senna 10/20/19  Yes Nita Sells, MD     Vital Signs: BP (!) 105/34 (BP Location: Left Wrist)   Pulse 78   Temp 98.5 F (36.9 C) (Oral)   Resp 16   Ht 5\' 2"  (1.575 m)   Wt (!) 415 lb (188.2 kg)   SpO2 100%   BMI 75.90 kg/m   Physical Exam Constitutional:      General: She is not in acute distress. Cardiovascular:     Rate and Rhythm: Normal rate and regular rhythm.     Comments: Right groin vascular site is clean and dry. The area is soft, no evidence of hematoma. No drainage observed. Mild tenderness to palpation.  Pulmonary:     Effort: Pulmonary effort is normal.  Skin:    General: Skin is warm and dry.  Neurological:     Mental Status: She is alert and oriented to person, place, and time.     Imaging: IR Angiogram Visceral Selective  Result Date: 11/16/2019 INDICATION: Bleeding duodenal ulcer by endoscopy, blood-loss anemia EXAM: ULTRASOUND GUIDANCE FOR VASCULAR ACCESS CELIAC AND GDA CATHETERIZATIONS AND ANGIOGRAMS GDA MICRO COIL EMBOLIZATION MEDICATIONS: 1% lidocaine local. ANESTHESIA/SEDATION: Moderate (conscious) sedation was employed during this procedure. A total of Versed 1.5 mg and Fentanyl 50 mcg was administered intravenously. Moderate Sedation Time: 40 minutes. The patient's level of consciousness and vital signs were monitored continuously by radiology nursing throughout the procedure under my direct supervision. CONTRAST:  70 cc omni 300  FLUOROSCOPY TIME:  Fluoroscopy Time: 9 minutes 0 seconds (3,066 mGy). COMPLICATIONS: None immediate. PROCEDURE: Informed consent was obtained from the patient following explanation of the procedure, risks, benefits and alternatives. The patient understands, agrees and consents for the procedure. All questions were addressed. A time out was performed prior to the initiation of the procedure. Maximal barrier sterile technique utilized including caps, mask, sterile gowns, sterile gloves, large sterile  drape, hand hygiene, and Betadine prep. Under sterile conditions and local anesthesia, ultrasound micropuncture access performed of the right common femoral artery. Images obtained for documentation. Five French sheath inserted over a guidewire. C2 catheter advanced and utilized to select the celiac origin. Celiac angiogram performed. Celiac: The celiac origin is widely patent. The splenic, left gastric, hepatic vasculature, and GDA are all visualized and patent. No active arterial GI bleeding demonstrated. However there are dilated branches within the pancreaticoduodenal arcade which are suspicious for sites of bleeding given the known large duodenal ulcer. Renegade STC microcatheter and a GT single angle Glidewire were utilized to advance the access into the GDA. Selective GDA angiogram performed. GDA: The GDA and gastroepiploic vasculature are all patent. Again, no active arterial bleeding noted but there is 1 dilated tortuous branch from the lower GDA which is suspicious for a bleeding source. GDA micro coil embolization: Microcatheter was advanced to the dilated pancreaticoduodenal branch. 4 and 5 mm interlock micro coils were deployed for embolization. Following embolization, the GDA is occluded. Repeat celiac angiogram confirms preserved patency of the hepatic vasculature and GDA occlusion. Catheter removed. Right common femoral artery access closed with the ExoSeal device and manual compression. No immediate complication. Patient tolerated the procedure well. IMPRESSION: Dilated branches of the pancreaticoduodenal arcade noted off of the mid to distal GDA. Although no active arterial bleeding demonstrated preventative GDA micro coil embolization performed as detailed above for the known large duodenal ulcer by endoscopy. Electronically Signed   By: Jerilynn Mages.  Shick M.D.   On: 11/16/2019 16:11   IR Angiogram Selective Each Additional Vessel  Result Date: 11/16/2019 INDICATION: Bleeding duodenal ulcer by  endoscopy, blood-loss anemia EXAM: ULTRASOUND GUIDANCE FOR VASCULAR ACCESS CELIAC AND GDA CATHETERIZATIONS AND ANGIOGRAMS GDA MICRO COIL EMBOLIZATION MEDICATIONS: 1% lidocaine local. ANESTHESIA/SEDATION: Moderate (conscious) sedation was employed during this procedure. A total of Versed 1.5 mg and Fentanyl 50 mcg was administered intravenously. Moderate Sedation Time: 40 minutes. The patient's level of consciousness and vital signs were monitored continuously by radiology nursing throughout the procedure under my direct supervision. CONTRAST:  70 cc omni 300 FLUOROSCOPY TIME:  Fluoroscopy Time: 9 minutes 0 seconds (3,066 mGy). COMPLICATIONS: None immediate. PROCEDURE: Informed consent was obtained from the patient following explanation of the procedure, risks, benefits and alternatives. The patient understands, agrees and consents for the procedure. All questions were addressed. A time out was performed prior to the initiation of the procedure. Maximal barrier sterile technique utilized including caps, mask, sterile gowns, sterile gloves, large sterile drape, hand hygiene, and Betadine prep. Under sterile conditions and local anesthesia, ultrasound micropuncture access performed of the right common femoral artery. Images obtained for documentation. Five French sheath inserted over a guidewire. C2 catheter advanced and utilized to select the celiac origin. Celiac angiogram performed. Celiac: The celiac origin is widely patent. The splenic, left gastric, hepatic vasculature, and GDA are all visualized and patent. No active arterial GI bleeding demonstrated. However there are dilated branches within the pancreaticoduodenal arcade which are suspicious for sites of bleeding given the known large duodenal ulcer. Renegade STC microcatheter and  a GT single angle Glidewire were utilized to advance the access into the GDA. Selective GDA angiogram performed. GDA: The GDA and gastroepiploic vasculature are all patent. Again, no  active arterial bleeding noted but there is 1 dilated tortuous branch from the lower GDA which is suspicious for a bleeding source. GDA micro coil embolization: Microcatheter was advanced to the dilated pancreaticoduodenal branch. 4 and 5 mm interlock micro coils were deployed for embolization. Following embolization, the GDA is occluded. Repeat celiac angiogram confirms preserved patency of the hepatic vasculature and GDA occlusion. Catheter removed. Right common femoral artery access closed with the ExoSeal device and manual compression. No immediate complication. Patient tolerated the procedure well. IMPRESSION: Dilated branches of the pancreaticoduodenal arcade noted off of the mid to distal GDA. Although no active arterial bleeding demonstrated preventative GDA micro coil embolization performed as detailed above for the known large duodenal ulcer by endoscopy. Electronically Signed   By: Jerilynn Mages.  Shick M.D.   On: 11/16/2019 16:11   IR US Guide Vasc Access Right  Result Date: 11/16/2019 INDICATION: Bleeding duodenal ulcer by endoscopy, blood-loss anemia EXAM: ULTRASOUND GUIDANCE FOR VASCULAR ACCESS CELIAC AND GDA CATHETERIZATIONS AND ANGIOGRAMS GDA MICRO COIL EMBOLIZATION MEDICATIONS: 1% lidocaine local. ANESTHESIA/SEDATION: Moderate (conscious) sedation was employed during this procedure. A total of Versed 1.5 mg and Fentanyl 50 mcg was administered intravenously. Moderate Sedation Time: 40 minutes. The patient's level of consciousness and vital signs were monitored continuously by radiology nursing throughout the procedure under my direct supervision. CONTRAST:  70 cc omni 300 FLUOROSCOPY TIME:  Fluoroscopy Time: 9 minutes 0 seconds (3,066 mGy). COMPLICATIONS: None immediate. PROCEDURE: Informed consent was obtained from the patient following explanation of the procedure, risks, benefits and alternatives. The patient understands, agrees and consents for the procedure. All questions were addressed. A time out  was performed prior to the initiation of the procedure. Maximal barrier sterile technique utilized including caps, mask, sterile gowns, sterile gloves, large sterile drape, hand hygiene, and Betadine prep. Under sterile conditions and local anesthesia, ultrasound micropuncture access performed of the right common femoral artery. Images obtained for documentation. Five French sheath inserted over a guidewire. C2 catheter advanced and utilized to select the celiac origin. Celiac angiogram performed. Celiac: The celiac origin is widely patent. The splenic, left gastric, hepatic vasculature, and GDA are all visualized and patent. No active arterial GI bleeding demonstrated. However there are dilated branches within the pancreaticoduodenal arcade which are suspicious for sites of bleeding given the known large duodenal ulcer. Renegade STC microcatheter and a GT single angle Glidewire were utilized to advance the access into the GDA. Selective GDA angiogram performed. GDA: The GDA and gastroepiploic vasculature are all patent. Again, no active arterial bleeding noted but there is 1 dilated tortuous branch from the lower GDA which is suspicious for a bleeding source. GDA micro coil embolization: Microcatheter was advanced to the dilated pancreaticoduodenal branch. 4 and 5 mm interlock micro coils were deployed for embolization. Following embolization, the GDA is occluded. Repeat celiac angiogram confirms preserved patency of the hepatic vasculature and GDA occlusion. Catheter removed. Right common femoral artery access closed with the ExoSeal device and manual compression. No immediate complication. Patient tolerated the procedure well. IMPRESSION: Dilated branches of the pancreaticoduodenal arcade noted off of the mid to distal GDA. Although no active arterial bleeding demonstrated preventative GDA micro coil embolization performed as detailed above for the known large duodenal ulcer by endoscopy. Electronically Signed    By: Jerilynn Mages.  Shick M.D.   On: 11/16/2019  16:11   IR EMBO ART  VEN HEMORR LYMPH EXTRAV  INC GUIDE ROADMAPPING  Result Date: 11/16/2019 INDICATION: Bleeding duodenal ulcer by endoscopy, blood-loss anemia EXAM: ULTRASOUND GUIDANCE FOR VASCULAR ACCESS CELIAC AND GDA CATHETERIZATIONS AND ANGIOGRAMS GDA MICRO COIL EMBOLIZATION MEDICATIONS: 1% lidocaine local. ANESTHESIA/SEDATION: Moderate (conscious) sedation was employed during this procedure. A total of Versed 1.5 mg and Fentanyl 50 mcg was administered intravenously. Moderate Sedation Time: 40 minutes. The patient's level of consciousness and vital signs were monitored continuously by radiology nursing throughout the procedure under my direct supervision. CONTRAST:  70 cc omni 300 FLUOROSCOPY TIME:  Fluoroscopy Time: 9 minutes 0 seconds (3,066 mGy). COMPLICATIONS: None immediate. PROCEDURE: Informed consent was obtained from the patient following explanation of the procedure, risks, benefits and alternatives. The patient understands, agrees and consents for the procedure. All questions were addressed. A time out was performed prior to the initiation of the procedure. Maximal barrier sterile technique utilized including caps, mask, sterile gowns, sterile gloves, large sterile drape, hand hygiene, and Betadine prep. Under sterile conditions and local anesthesia, ultrasound micropuncture access performed of the right common femoral artery. Images obtained for documentation. Five French sheath inserted over a guidewire. C2 catheter advanced and utilized to select the celiac origin. Celiac angiogram performed. Celiac: The celiac origin is widely patent. The splenic, left gastric, hepatic vasculature, and GDA are all visualized and patent. No active arterial GI bleeding demonstrated. However there are dilated branches within the pancreaticoduodenal arcade which are suspicious for sites of bleeding given the known large duodenal ulcer. Renegade STC microcatheter and a GT  single angle Glidewire were utilized to advance the access into the GDA. Selective GDA angiogram performed. GDA: The GDA and gastroepiploic vasculature are all patent. Again, no active arterial bleeding noted but there is 1 dilated tortuous branch from the lower GDA which is suspicious for a bleeding source. GDA micro coil embolization: Microcatheter was advanced to the dilated pancreaticoduodenal branch. 4 and 5 mm interlock micro coils were deployed for embolization. Following embolization, the GDA is occluded. Repeat celiac angiogram confirms preserved patency of the hepatic vasculature and GDA occlusion. Catheter removed. Right common femoral artery access closed with the ExoSeal device and manual compression. No immediate complication. Patient tolerated the procedure well. IMPRESSION: Dilated branches of the pancreaticoduodenal arcade noted off of the mid to distal GDA. Although no active arterial bleeding demonstrated preventative GDA micro coil embolization performed as detailed above for the known large duodenal ulcer by endoscopy. Electronically Signed   By: Jerilynn Mages.  Shick M.D.   On: 11/16/2019 16:11    Labs:  CBC: Recent Labs    11/10/19 0410 11/11/19 0041 11/11/19 0220 11/11/19 1159 11/15/19 0509 11/16/19 0430 11/16/19 2200 11/17/19 0500  WBC 8.1  --  8.8  --   --  8.2  --  10.3  HGB 8.1*   < > 5.6*   < > 8.3* 7.7* 9.6* 8.7*  HCT 27.4*   < > 18.7*   < > 25.2* 24.3* 29.1* 26.8*  PLT 612*  --  453*  --   --  281  --  308   < > = values in this interval not displayed.    COAGS: Recent Labs    11/06/19 0613  INR 1.0    BMP: Recent Labs    11/14/19 0240 11/15/19 0509 11/16/19 0430 11/17/19 0500  NA 143 144 147* 142  K 3.9 4.0 3.8 3.4*  CL 117* 115* 114* 114*  CO2 16* 21* 22 22  GLUCOSE 86 87 85 100*  BUN 66* 62* 58* 46*  CALCIUM 7.7* 8.3* 8.5* 8.0*  CREATININE 3.77* 3.49* 3.31* 2.64*  GFRNONAA 12* 13* 14* 18*  GFRAA 14* 15* 16* 21*    LIVER FUNCTION TESTS: Recent  Labs    11/05/19 2216 11/05/19 2216 11/06/19 1751 11/06/19 1751 11/07/19 0459 11/07/19 0459 11/08/19 0310 11/09/19 0831 11/10/19 0410 11/11/19 0220 11/12/19 0255 11/13/19 0257  BILITOT 0.4  --  0.4  --  0.6  --  0.5  --   --   --   --   --   AST 17  --  14*  --  11*  --  12*  --   --   --   --   --   ALT 17  --  15  --  15  --  15  --   --   --   --   --   ALKPHOS 104  --  94  --  97  --  89  --   --   --   --   --   PROT 6.5  --  6.0*  --  6.3*  --  6.1*  --   --   --   --   --   ALBUMIN 3.1*   < > 2.7*   < > 2.8*   < > 2.7*   < > 2.5* 2.0* 2.1* 2.0*   < > = values in this interval not displayed.    Assessment and Plan:  GI bleed, s/p GDA embolization 11/16/19: Patient has had decreased bowel movements. VSS. Hemoglobin level slightly decreased from yesterday (8.7). Vascular site is clean and dry; no evidence of hematoma.   IR will sign off. Please contact IR with any questions.   Electronically Signed: Soyla Dryer, AGACNP-BC [phone] 11/17/2019, 3:33 PM   I spent a total of 15 Minutes at the the patient's bedside AND on the patient's hospital floor or unit, greater than 50% of which was counseling/coordinating care for GDA embolization follow up

## 2019-11-18 ENCOUNTER — Encounter (HOSPITAL_COMMUNITY): Payer: Self-pay | Admitting: Gastroenterology

## 2019-11-18 LAB — BASIC METABOLIC PANEL
Anion gap: 7 (ref 5–15)
BUN: 32 mg/dL — ABNORMAL HIGH (ref 8–23)
CO2: 21 mmol/L — ABNORMAL LOW (ref 22–32)
Calcium: 7.7 mg/dL — ABNORMAL LOW (ref 8.9–10.3)
Chloride: 113 mmol/L — ABNORMAL HIGH (ref 98–111)
Creatinine, Ser: 2.21 mg/dL — ABNORMAL HIGH (ref 0.44–1.00)
GFR calc Af Amer: 26 mL/min — ABNORMAL LOW (ref >60)
GFR calc non Af Amer: 22 mL/min — ABNORMAL LOW (ref >60)
Glucose, Bld: 142 mg/dL — ABNORMAL HIGH (ref 70–99)
Potassium: 3.3 mmol/L — ABNORMAL LOW (ref 3.5–5.1)
Sodium: 141 mmol/L (ref 135–145)

## 2019-11-18 LAB — CBC
HCT: 27.7 % — ABNORMAL LOW (ref 36.0–46.0)
Hemoglobin: 8.9 g/dL — ABNORMAL LOW (ref 12.0–15.0)
MCH: 28.9 pg (ref 26.0–34.0)
MCHC: 32.1 g/dL (ref 30.0–36.0)
MCV: 89.9 fL (ref 80.0–100.0)
Platelets: 335 10*3/uL (ref 150–400)
RBC: 3.08 MIL/uL — ABNORMAL LOW (ref 3.87–5.11)
RDW: 19.2 % — ABNORMAL HIGH (ref 11.5–15.5)
WBC: 9.8 10*3/uL (ref 4.0–10.5)
nRBC: 0.2 % (ref 0.0–0.2)

## 2019-11-18 LAB — GLUCOSE, CAPILLARY
Glucose-Capillary: 87 mg/dL (ref 70–99)
Glucose-Capillary: 132 mg/dL — ABNORMAL HIGH (ref 70–99)
Glucose-Capillary: 104 mg/dL — ABNORMAL HIGH (ref 70–99)
Glucose-Capillary: 119 mg/dL — ABNORMAL HIGH (ref 70–99)

## 2019-11-18 MED ORDER — CALCIUM GLUCONATE-NACL 1-0.675 GM/50ML-% IV SOLN
1.0000 g | Freq: Once | INTRAVENOUS | Status: AC
  Administered 2019-11-18: 1000 mg via INTRAVENOUS
  Filled 2019-11-18: qty 50

## 2019-11-18 MED ORDER — POTASSIUM CHLORIDE CRYS ER 20 MEQ PO TBCR
40.0000 meq | EXTENDED_RELEASE_TABLET | Freq: Once | ORAL | Status: AC
  Administered 2019-11-18: 40 meq via ORAL
  Filled 2019-11-18: qty 2

## 2019-11-18 NOTE — Progress Notes (Signed)
Patient denies doing well with nocturnal CPAP last night and declines use for tonight. States she may have tolerated it better if she were less "frustrated' and feels people "don't care or understand". Encouragement given. She agrees to call for CPAP placement should she change her mind. Patient asks for prayers. Equipment remains at bedside, ready for use. RT will continue to follow and encourage.

## 2019-11-18 NOTE — NC FL2 (Signed)
[place] LEVEL OF CARE SCREENING TOOL     IDENTIFICATION  Patient Name: [name] [name] Birthdate: 05/29/1955 Sex: female Admission Date (Current Location): 11/05/2019  [facility] and [place] Number:  Herbalist and Address:  [facility],  [address], [place]      Provider Number: [id number]  Attending Physician Name and Address:  Georgette Shell, MD  Relative Name and Phone Number:  Frederick Peers [phone]  [phone] Sister [phone]  [phone], or Shaw,Shirley Sister [phone]  [phone]    Current Level of Care: Hospital Recommended Level of Care: [place] Prior Approval Number:    Date Approved/Denied:   PASRR Number: [id number]  Discharge Plan: SNF    Current Diagnoses: Patient Active Problem List   Diagnosis Date Noted   Anemia associated with acute blood loss 11/11/2019   Encephalopathy acute 11/06/2019   Vaginal discharge 11/06/2019   GI bleeding 10/19/2019   Nausea & vomiting 10/19/2019   Morbid obesity ([place]) 08/22/2019   AKI (acute kidney injury) ([place]) 08/21/2019   Multiple wounds 08/21/2019   Opiate overdose ([place]) 08/20/2019   Cellulitis 10/05/2018   Myalgia 10/05/2018   Acute cystitis without hematuria 10/05/2018   Essential hypertension 10/05/2018   Pressure injury of skin 10/05/2018   Hypoglycemia secondary to sulfonylurea 11/21/2016   CKD (chronic kidney disease) stage 4, GFR 15-29 ml/min ([place]) 11/21/2016   Substance induced mood disorder ([place]) 07/02/2014   Acute renal failure syndrome (HCC)    Encephalopathy    Hyperkalemia    Hypokalemia    Leukocytosis    Type 2 diabetes mellitus with stage 4 chronic kidney disease (HCC)    HLD (hyperlipidemia)    Metabolic encephalopathy    Depression    Anxiety state    Acute renal failure ([place]) 06/28/2014    Orientation RESPIRATION BLADDER Height & Weight     Self,  Time, Situation, Place  Normal Continent Weight: (!) 188.2 kg Height:  5\' 2"  (157.5 cm)  BEHAVIORAL SYMPTOMS/MOOD NEUROLOGICAL BOWEL NUTRITION STATUS      Incontinent Diet (regular)  AMBULATORY STATUS COMMUNICATION OF NEEDS Skin   Extensive Assist Verbally PU Stage and Appropriate Care PU Stage 1 Dressing: Daily PU Stage 2 Dressing: Daily PU Stage 3 Dressing: Daily                 Personal Care Assistance Level of Assistance  Bathing, Feeding, Dressing Bathing Assistance: Limited assistance Feeding assistance: Independent Dressing Assistance: Limited assistance     Functional Limitations Info  Sight, Hearing, Speech Sight Info: Adequate Hearing Info: Adequate Speech Info: Adequate    SPECIAL CARE FACTORS FREQUENCY  PT (By licensed PT), OT (By licensed OT)     PT Frequency: 5 x weekly OT Frequency: 5 x weekly            Contractures Contractures Info: Not present    Additional Factors Info  Code Status Code Status Info: full Allergies Info: morphine pencillins   Insulin Sliding Scale Info: insulin aspart(novolog)injection 0-6 units 3x a day       Current Medications (11/18/2019):  This is the current hospital active medication list Current Facility-Administered Medications  Medication Dose Route Frequency Provider Last Rate Last Admin   0.9 %  sodium chloride infusion   Intravenous Continuous Carol Ada, MD 20 mL/hr at 11/16/19 1730 Restarted at 11/16/19 1730   acetaminophen (TYLENOL) tablet 650 mg  650 mg Oral Q6H PRN Carol Ada, MD   650 mg  at 11/07/19 1343   Or   acetaminophen (TYLENOL) suppository 650 mg  650 mg Rectal Q6H PRN Carol Ada, MD       albuterol (PROVENTIL) (2.5 MG/3ML) 0.083% nebulizer solution 2.5 mg  2.5 mg Nebulization Q2H PRN Carol Ada, MD       ALPRAZolam Duanne Moron) tablet 0.5 mg  0.5 mg Oral Q8H PRN Georgette Shell, MD   0.5 mg at 11/18/19 1240   alum & mag hydroxide-simeth (MAALOX/MYLANTA) 200-200-20 MG/5ML  suspension 30 mL  30 mL Oral Q6H PRN Georgette Shell, MD       atorvastatin (LIPITOR) tablet 10 mg  10 mg Oral Daily Carol Ada, MD   10 mg at 11/18/19 0962   calcium gluconate 1 g/ 50 mL sodium chloride IVPB  1 g Intravenous Once Georgette Shell, MD 50 mL/hr at 11/18/19 1416 1,000 mg at 11/18/19 1416   chlorhexidine (PERIDEX) 0.12 % solution 15 mL  15 mL Mouth Rinse BID Carol Ada, MD   15 mL at 11/18/19 8366   Chlorhexidine Gluconate Cloth 2 % PADS 6 each  6 each Topical Daily Carol Ada, MD   6 each at 11/17/19 1047   diphenhydrAMINE (BENADRYL) capsule 25 mg  25 mg Oral Q6H PRN Georgette Shell, MD   25 mg at 11/16/19 2006   feeding supplement (GLUCERNA SHAKE) (GLUCERNA SHAKE) liquid 237 mL  237 mL Oral BID BM Carol Ada, MD   237 mL at 11/11/19 1347   haloperidol lactate (HALDOL) injection 2 mg  2 mg Intravenous Q6H PRN Carol Ada, MD   2 mg at 11/11/19 2139   HYDROcodone-acetaminophen (NORCO/VICODIN) 5-325 MG per tablet 1 tablet  1 tablet Oral Q8H PRN Carol Ada, MD   1 tablet at 11/16/19 0003   hydrOXYzine (ATARAX/VISTARIL) tablet 50 mg  50 mg Oral TID PRN Carol Ada, MD   50 mg at 11/16/19 2006   insulin aspart (novoLOG) injection 0-6 Units  0-6 Units Subcutaneous TID WC Carol Ada, MD       lactated ringers infusion   Intravenous Continuous Carol Ada, MD   Stopped at 11/16/19 2000   lip balm (CARMEX) ointment   Topical PRN Carol Ada, MD       loperamide (IMODIUM) capsule 2 mg  2 mg Oral Q6H PRN Carol Ada, MD   2 mg at 11/10/19 1325   MEDLINE mouth rinse  15 mL Mouth Rinse q12n4p Carol Ada, MD   15 mL at 11/16/19 1700   ondansetron (ZOFRAN) injection 4 mg  4 mg Intravenous Q6H PRN Carol Ada, MD   4 mg at 11/16/19 1604   pantoprazole (PROTONIX) injection 40 mg  40 mg Intravenous Q12H Carol Ada, MD   40 mg at 11/18/19 0853   polyvinyl alcohol (LIQUIFILM TEARS) 1.4 % ophthalmic solution 1 drop  1 drop Both Eyes PRN  Georgette Shell, MD   1 drop at 11/18/19 0907   sodium chloride flush (NS) 0.9 % injection 10-40 mL  10-40 mL Intracatheter PRN Carol Ada, MD       sucralfate (CARAFATE) tablet 1 g  1 g Oral TID WC & HS Carol Ada, MD   1 g at 11/18/19 1235     Discharge Medications: Please see discharge summary for a list of discharge medications.  Relevant Imaging Results:  Relevant Lab Results:   Additional Information SSN [id number]  Leeroy Cha, RN

## 2019-11-18 NOTE — Progress Notes (Signed)
Pt has been turning down clear liquid meal trays, and only drinking cranberry juice. Now pt is refusing daily bath and dressing changes unless her diet is advanced. Dr. Rodena Piety paged by RN; orders received for soft diet at this time. This RN will continue to monitor.

## 2019-11-18 NOTE — Progress Notes (Signed)
Report called to Joycelyn Rua RN. All questions answered at this time. Pt transferred upstairs by RN and NT in the bed on tele with 2L [place]. Paper chart and all pt belongings transferred with pt. 4W will continue care of pt.

## 2019-11-18 NOTE — TOC Progression Note (Addendum)
Transition of Care [facility]) - Progression Note    Patient Details  Name: [name] [name] MRN: [id number] Date of Birth: 12-18-1955  Transition of Care [facility]) CM/SW Contact  Leeroy Cha, RN Phone Number: 11/18/2019, 9:35 AM  Clinical Narrative:    #1 AKI with CKD stage IV this is thought to be due to multifactorial causes including hypotension multiple medications including chlorthalidone.  It appears her creatinine is improving after Lasix challenge.  Nephrology was consulted they signed off on 11/17/2019.  Her creatinine has improved to 2.64 from 6.4 on admission.  Now she gets as needed Lasix.  Lasix was stopped due to hypotension.  She is positive by 6 L down from 9.5 L yesterday.  .  #2  Acute on chronic anemia secondary to upper GI bleed she received a total of 8  units of packed RBC.  Patient  had colonoscopy 9/23 no evidence of active bleeding was noted. egd 9/24 -normal esophagus, red blood in the gastric body and in the antrum and pylorus, adherent clot duodenal ulcer injected with hemostatic spray.  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  Hemoglobin 8.7 down from 9.6 patient has rectal tube with black stools.  #3 acute metabolic encephalopathy seems to have resolved, this is thought to be likely related to polypharmacy.  Prior to admission patient was on Percocet hydroxyzine Cymbalta Neurontin and Xanax at the nursing home.  These medications have been on hold mostly except for low-dose Percocet and intermittent Atarax that have been restarted.  She is awake and alert today and answers all my questions appropriately.  #4 type 2 diabetes with hypoglycemic episodes on SSI. [place] at 2l/min, iv lr at 20cc/hr, bun 46 creat 2.64/see above for history Plan from [place] sn[facility] will return Following for progression. Update fl2 sent to [employer] via the hub. Expected Discharge Plan: [place] Barriers to Discharge: Continued Medical Work  up, Ship broker  Expected Discharge Plan and Services Expected Discharge Plan: [place] Choice: [place] arrangements for the past 2 months: [place], Single Family Home Expected Discharge Date:  (unknown)                 DME Agency: NA       HH Arranged: NA           Social Determinants of Health (SDOH) Interventions    Readmission Risk Interventions Readmission Risk Prevention Plan 11/08/2019  Transportation Screening Complete  Medication Review [employer]) Referral to Pharmacy  PCP or Specialist appointment within 3-5 days of discharge Complete  HRI or [place] Complete  SW Recovery Care/Counseling Consult Complete  Palliative Care Screening Not Applicable  Skilled Nursing Facility Complete  Some recent data might be hidden

## 2019-11-18 NOTE — Plan of Care (Signed)
  Problem: Clinical Measurements: Goal: Ability to maintain clinical measurements within normal limits will improve Outcome: Progressing Goal: Will remain free from infection Outcome: Progressing Goal: Diagnostic test results will improve Outcome: Progressing   Problem: Coping: Goal: Level of anxiety will decrease Outcome: Progressing   Problem: Elimination: Goal: Will not experience complications related to bowel motility Outcome: Progressing   Problem: Pain Managment: Goal: General experience of comfort will improve Outcome: Progressing   Problem: Safety: Goal: Ability to remain free from injury will improve Outcome: Progressing   Problem: Skin Integrity: Goal: Risk for impaired skin integrity will decrease Outcome: Progressing

## 2019-11-18 NOTE — Progress Notes (Signed)
PROGRESS NOTE    [name] [name]  XHB:[id number] DOB: 1955/02/22 DOA: 11/05/2019 PCP: [name], MD    Brief Narrative:64 year old female with super morbid obesity, hypertension, type 2 diabetes, stage IV chronic kidney disease with baseline creatinine about 1.7, recent left leg injury and fracture recovering at the skilled nursing facility brought to the ER on 9/14 for confusion.  Patient was reportedly unresponsive to verbal stimuli and subsequently was incoherent.  In the emergency room, she improved spontaneously and responded to questions appropriately.  Patient is on multiple medications including narcotics and benzodiazepine.  Admitted with mild acute metabolic encephalopathy and acute renal failure. Patient continued to have diarrhea 11/15/2019 on the evening of 11/10/2019 patient started having bloody stools with frank bleeding per rectum with drop in hemoglobin she received 6 unit of packed RBC at that time she had colonoscopy done on 11/14/2019 showed diverticuli in the ascending colon otherwise with normal exam however it was not an optimal study.  Patient continues with slow drifting down of hemoglobin received on the unit of packed RBC on 11/14/2019 so far she has received a total of 8 units of packed RBC.   On 11/16/2019 patient had GDA embolization by interventional radiology for ongoing GI bleed. Assessment & Plan:   Principal Problem:   Acute renal failure (HCC) Active Problems:   Type 2 diabetes mellitus with stage 4 chronic kidney disease (HCC)   Metabolic encephalopathy   CKD (chronic kidney disease) stage 4, GFR 15-29 ml/min (HCC)   Pressure injury of skin   Morbid obesity (HCC)   GI bleeding   Encephalopathy acute   Vaginal discharge   Anemia associated with acute blood loss   #1 AKI with CKD stage IV this is thought to be due to multifactorial causes including hypotension multiple medications including chlorthalidone.  It appears her creatinine is improving  after Lasix challenge.  Nephrology was consulted they signed off on 11/17/2019.  Her creatinine has improved to 2.21 from 2.64 from 6.4 on admission.  Now she gets as needed Lasix.  Lasix was stopped due to hypotension.  She is positive by 5 L down from 6 L down from 9.5 L yesterday.  .  #2  Acute on chronic anemia secondary to upper GI bleed she received a total of 8  units of packed RBC.  Patient  had colonoscopy 9/23 no evidence of active bleeding was noted. egd 9/24 -normal esophagus, red blood in the gastric body and in the antrum and pylorus, adherent clot duodenal ulcer injected with hemostatic spray.  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  Hemoglobin 8.9 down from 9.6 patient has rectal tube with black stools.  No fresh blood noted in the rectal tube.  Black stools could be old blood which is still in the GI tract.  #3 acute metabolic encephalopathy seems to have resolved, this is thought to be likely related to polypharmacy.  Prior to admission patient was on Percocet hydroxyzine Cymbalta Neurontin and Xanax at the nursing home.  These medications have been on hold mostly except for low-dose Percocet and intermittent Atarax that have been restarted.  She is awake and alert today and answers all my questions appropriately.  #4 type 2 diabetes with hypoglycemic episodes on SSI. CBG (last 3)  Recent Labs    11/17/19 2127 11/18/19 0823 11/18/19 1216  GLUCAP 106* 87 132*    #5 status post UTI treated with Rocephin for 3 days.  #6 mild hypokalemia and hypocalcemia replete  Estimated body mass index is 75.9 kg/m as calculated from the following:   Height as of this encounter: 5\' 2"  (1.575 m).   Weight as of this encounter: 188.2 kg.  DVT prophylaxis: SCD code Status: Full code Family Communication: None at bedside Disposition Plan:  Status is: Inpatient  Dispo:  Patient From: [place] [facility]  Planned Disposition: [place] patient came from SNF however she does not want to go back to the SNF that she came from.  Expected discharge date: Unknown  medically stable for discharge: No still being monitored for GI bleed and AKI with daily labs  Consultants: GI nephrology   Procedures: A-line, subclavian line Antimicrobials: Anti-infectives (From admission, onward)   Start     Dose/Rate Route Frequency Ordered Stop   11/06/19 0600  metroNIDAZOLE (FLAGYL) IVPB 500 mg  Status:  Discontinued        500 mg 100 mL/hr over 60 Minutes Intravenous Every 8 hours 11/06/19 0417 11/11/19 1012   11/06/19 0500  cefTRIAXone (ROCEPHIN) 1 g in sodium chloride 0.9 % 100 mL IVPB  Status:  Discontinued        1 g 200 mL/hr over 30 Minutes Intravenous Every 24 hours 11/06/19 0417 11/09/19 1029       Subjective: Patient is resting in bed she has no specific complaints today Rectal tube still in place with black stools Objective: Vitals:   11/18/19 1055 11/18/19 1100 11/18/19 1200 11/18/19 1300  BP: (!) 108/42 (!) 126/53 (!) 114/42 (!) 107/47  Pulse: 92 87 95 87  Resp: (!) 27 17 (!) 21 (!) 27  Temp:   98.2 F (36.8 C)   TempSrc:   Oral   SpO2: 97% 100% 100% 100%  Weight:      Height:        Intake/Output Summary (Last 24 hours) at 11/18/2019 1340 Last data filed at 11/18/2019 1300 Gross per 24 hour  Intake 838 ml  Output 1800 ml  Net -962 ml   Filed Weights   11/15/19 0500 11/16/19 0500 11/17/19 0500  Weight: (!) 188.2 kg (!) 189 kg (!) 188.2 kg    Examination- General exam: Appears calm and comfortable  Respiratory system: Clear to auscultation. Respiratory effort normal. Cardiovascular system: S1 & S2 heard, RRR. No JVD, murmurs, rubs, gallops or clicks. No pedal edema. Gastrointestinal system: Abdomen is distended, soft and nontender. No organomegaly or masses felt. Normal bowel sounds heard. Central nervous system: Alert and oriented. No focal neurological deficits. Extremities: 2+ pitting  edema Skin: No rashes, lesions or ulcers Psychiatry: Judgement and insight appear normal. Mood & affect appropriate.     Data Reviewed: I have personally reviewed following labs and imaging studies  CBC: Recent Labs  Lab 11/15/19 0509 11/16/19 0430 11/16/19 2200 11/17/19 0500 11/18/19 0500  WBC  --  8.2  --  10.3 9.8  HGB 8.3* 7.7* 9.6* 8.7* 8.9*  HCT 25.2* 24.3* 29.1* 26.8* 27.7*  MCV  --  87.7  --  87.6 89.9  PLT  --  281  --  308 086   Basic Metabolic Panel: Recent Labs  Lab 11/12/19 0255 11/12/19 0255 11/13/19 0257 11/13/19 0257 11/14/19 0240 11/15/19 0509 11/16/19 0430 11/17/19 0500 11/18/19 0500  NA 147*   < > 140   < > 143 144 147* 142 141  K 4.3   < > 4.2   < > 3.9 4.0 3.8 3.4* 3.3*  CL 116*   < > 116*   < >  117* 115* 114* 114* 113*  CO2 19*   < > 18*   < > 16* 21* 22 22 21*  GLUCOSE 119*   < > 145*   < > 86 87 85 100* 142*  BUN 82*   < > 77*   < > 66* 62* 58* 46* 32*  CREATININE 5.27*   < > 4.27*   < > 3.77* 3.49* 3.31* 2.64* 2.21*  CALCIUM 8.4*   < > 7.7*   < > 7.7* 8.3* 8.5* 8.0* 7.7*  PHOS 5.6*  --  4.2  --   --   --   --   --   --    < > = values in this interval not displayed.   GFR: Estimated Creatinine Clearance: 41.6 mL/min (A) (by C-G formula based on SCr of 2.21 mg/dL (H)). Liver Function Tests: Recent Labs  Lab 11/12/19 0255 11/13/19 0257  ALBUMIN 2.1* 2.0*   No results for input(s): LIPASE, AMYLASE in the last 168 hours. No results for input(s): AMMONIA in the last 168 hours. Coagulation Profile: No results for input(s): INR, PROTIME in the last 168 hours. Cardiac Enzymes: No results for input(s): CKTOTAL, CKMB, CKMBINDEX, TROPONINI in the last 168 hours. BNP (last 3 results) No results for input(s): PROBNP in the last 8760 hours. HbA1C: No results for input(s): HGBA1C in the last 72 hours. CBG: Recent Labs  Lab 11/17/19 1306 11/17/19 1628 11/17/19 2127 11/18/19 0823 11/18/19 1216  GLUCAP 94 114* 106* 87 132*   Lipid  Profile: No results for input(s): CHOL, HDL, LDLCALC, TRIG, CHOLHDL, LDLDIRECT in the last 72 hours. Thyroid Function Tests: No results for input(s): TSH, T4TOTAL, FREET4, T3FREE, THYROIDAB in the last 72 hours. Anemia Panel: No results for input(s): VITAMINB12, FOLATE, FERRITIN, TIBC, IRON, RETICCTPCT in the last 72 hours. Sepsis Labs: No results for input(s): PROCALCITON, LATICACIDVEN in the last 168 hours.  Recent Results (from the past 240 hour(s))  Culture, Urine     Status: Abnormal   Collection Time: 11/08/19  1:49 PM   Specimen: Urine, Catheterized  Result Value Ref Range Status   Specimen Description   Final    URINE, CATHETERIZED Performed at [facility] [address], [place], [place] [address]    Special Requests   Final    NONE Performed at [facility], [place] [address] [place], [place] [address]    Culture MULTIPLE SPECIES PRESENT, SUGGEST RECOLLECTION (A)  Final   Report Status 11/09/2019 FINAL  Final  Culture, Urine     Status: Abnormal   Collection Time: 11/09/19 12:04 PM   Specimen: Urine, Random  Result Value Ref Range Status   Specimen Description   Final    URINE, RANDOM Performed at [facility] [address], [place], [place] [address]    Special Requests   Final    NONE Performed at [facility], [place] [address] [place], [place] [address]    Culture (A)  Final    <10,000 COLONIES/mL INSIGNIFICANT GROWTH Performed at [facility] [address] [place], [place] [address]    Report Status 11/10/2019 FINAL  Final  MRSA PCR Screening     Status: None   Collection Time: 11/14/19  6:30 PM   Specimen: Nasopharyngeal  Result Value Ref Range Status   MRSA by PCR NEGATIVE NEGATIVE Final    Comment:        The GeneXpert MRSA Assay (FDA approved for NASAL specimens  only), is one component of a comprehensive MRSA colonization surveillance program. It is not intended  to diagnose MRSA infection nor to guide or monitor treatment for MRSA infections. Performed at [facility], [place] [address], [place], [place] [address]          Radiology Studies: IR Angiogram Visceral Selective  Result Date: 11/16/2019 INDICATION: Bleeding duodenal ulcer by endoscopy, blood-loss anemia EXAM: ULTRASOUND GUIDANCE FOR VASCULAR ACCESS CELIAC AND GDA CATHETERIZATIONS AND ANGIOGRAMS GDA MICRO COIL EMBOLIZATION MEDICATIONS: 1% lidocaine local. ANESTHESIA/SEDATION: Moderate (conscious) sedation was employed during this procedure. A total of Versed 1.5 mg and Fentanyl 50 mcg was administered intravenously. Moderate Sedation Time: 40 minutes. The patient's level of consciousness and vital signs were monitored continuously by radiology nursing throughout the procedure under my direct supervision. CONTRAST:  70 cc omni 300 FLUOROSCOPY TIME:  Fluoroscopy Time: 9 minutes 0 seconds (3,066 mGy). COMPLICATIONS: None immediate. PROCEDURE: Informed consent was obtained from the patient following explanation of the procedure, risks, benefits and alternatives. The patient understands, agrees and consents for the procedure. All questions were addressed. A time out was performed prior to the initiation of the procedure. Maximal barrier sterile technique utilized including caps, mask, sterile gowns, sterile gloves, large sterile drape, hand hygiene, and Betadine prep. Under sterile conditions and local anesthesia, ultrasound micropuncture access performed of the right common femoral artery. Images obtained for documentation. Five French sheath inserted over a guidewire. C2 catheter advanced and utilized to select the celiac origin. Celiac angiogram performed. Celiac: The celiac origin is widely patent. The splenic, left gastric, hepatic vasculature, and GDA are all visualized and patent. No active arterial GI bleeding demonstrated. However there are dilated branches within the  pancreaticoduodenal arcade which are suspicious for sites of bleeding given the known large duodenal ulcer. Renegade STC microcatheter and a GT single angle Glidewire were utilized to advance the access into the GDA. Selective GDA angiogram performed. GDA: The GDA and gastroepiploic vasculature are all patent. Again, no active arterial bleeding noted but there is 1 dilated tortuous branch from the lower GDA which is suspicious for a bleeding source. GDA micro coil embolization: Microcatheter was advanced to the dilated pancreaticoduodenal branch. 4 and 5 mm interlock micro coils were deployed for embolization. Following embolization, the GDA is occluded. Repeat celiac angiogram confirms preserved patency of the hepatic vasculature and GDA occlusion. Catheter removed. Right common femoral artery access closed with the ExoSeal device and manual compression. No immediate complication. Patient tolerated the procedure well. IMPRESSION: Dilated branches of the pancreaticoduodenal arcade noted off of the mid to distal GDA. Although no active arterial bleeding demonstrated preventative GDA micro coil embolization performed as detailed above for the known large duodenal ulcer by endoscopy. Electronically Signed   By: Jerilynn Mages.  Shick M.D.   On: 11/16/2019 16:11   IR Angiogram Selective Each Additional Vessel  Result Date: 11/16/2019 INDICATION: Bleeding duodenal ulcer by endoscopy, blood-loss anemia EXAM: ULTRASOUND GUIDANCE FOR VASCULAR ACCESS CELIAC AND GDA CATHETERIZATIONS AND ANGIOGRAMS GDA MICRO COIL EMBOLIZATION MEDICATIONS: 1% lidocaine local. ANESTHESIA/SEDATION: Moderate (conscious) sedation was employed during this procedure. A total of Versed 1.5 mg and Fentanyl 50 mcg was administered intravenously. Moderate Sedation Time: 40 minutes. The patient's level of consciousness and vital signs were monitored continuously by radiology nursing throughout the procedure under my direct supervision. CONTRAST:  70 cc omni 300  FLUOROSCOPY TIME:  Fluoroscopy Time: 9 minutes 0 seconds (3,066 mGy). COMPLICATIONS: None immediate. PROCEDURE: Informed consent was obtained from the patient following explanation of  the procedure, risks, benefits and alternatives. The patient understands, agrees and consents for the procedure. All questions were addressed. A time out was performed prior to the initiation of the procedure. Maximal barrier sterile technique utilized including caps, mask, sterile gowns, sterile gloves, large sterile drape, hand hygiene, and Betadine prep. Under sterile conditions and local anesthesia, ultrasound micropuncture access performed of the right common femoral artery. Images obtained for documentation. Five French sheath inserted over a guidewire. C2 catheter advanced and utilized to select the celiac origin. Celiac angiogram performed. Celiac: The celiac origin is widely patent. The splenic, left gastric, hepatic vasculature, and GDA are all visualized and patent. No active arterial GI bleeding demonstrated. However there are dilated branches within the pancreaticoduodenal arcade which are suspicious for sites of bleeding given the known large duodenal ulcer. Renegade STC microcatheter and a GT single angle Glidewire were utilized to advance the access into the GDA. Selective GDA angiogram performed. GDA: The GDA and gastroepiploic vasculature are all patent. Again, no active arterial bleeding noted but there is 1 dilated tortuous branch from the lower GDA which is suspicious for a bleeding source. GDA micro coil embolization: Microcatheter was advanced to the dilated pancreaticoduodenal branch. 4 and 5 mm interlock micro coils were deployed for embolization. Following embolization, the GDA is occluded. Repeat celiac angiogram confirms preserved patency of the hepatic vasculature and GDA occlusion. Catheter removed. Right common femoral artery access closed with the ExoSeal device and manual compression. No immediate  complication. Patient tolerated the procedure well. IMPRESSION: Dilated branches of the pancreaticoduodenal arcade noted off of the mid to distal GDA. Although no active arterial bleeding demonstrated preventative GDA micro coil embolization performed as detailed above for the known large duodenal ulcer by endoscopy. Electronically Signed   By: Jerilynn Mages.  Shick M.D.   On: 11/16/2019 16:11   IR US Guide Vasc Access Right  Result Date: 11/16/2019 INDICATION: Bleeding duodenal ulcer by endoscopy, blood-loss anemia EXAM: ULTRASOUND GUIDANCE FOR VASCULAR ACCESS CELIAC AND GDA CATHETERIZATIONS AND ANGIOGRAMS GDA MICRO COIL EMBOLIZATION MEDICATIONS: 1% lidocaine local. ANESTHESIA/SEDATION: Moderate (conscious) sedation was employed during this procedure. A total of Versed 1.5 mg and Fentanyl 50 mcg was administered intravenously. Moderate Sedation Time: 40 minutes. The patient's level of consciousness and vital signs were monitored continuously by radiology nursing throughout the procedure under my direct supervision. CONTRAST:  70 cc omni 300 FLUOROSCOPY TIME:  Fluoroscopy Time: 9 minutes 0 seconds (3,066 mGy). COMPLICATIONS: None immediate. PROCEDURE: Informed consent was obtained from the patient following explanation of the procedure, risks, benefits and alternatives. The patient understands, agrees and consents for the procedure. All questions were addressed. A time out was performed prior to the initiation of the procedure. Maximal barrier sterile technique utilized including caps, mask, sterile gowns, sterile gloves, large sterile drape, hand hygiene, and Betadine prep. Under sterile conditions and local anesthesia, ultrasound micropuncture access performed of the right common femoral artery. Images obtained for documentation. Five French sheath inserted over a guidewire. C2 catheter advanced and utilized to select the celiac origin. Celiac angiogram performed. Celiac: The celiac origin is widely patent. The splenic,  left gastric, hepatic vasculature, and GDA are all visualized and patent. No active arterial GI bleeding demonstrated. However there are dilated branches within the pancreaticoduodenal arcade which are suspicious for sites of bleeding given the known large duodenal ulcer. Renegade STC microcatheter and a GT single angle Glidewire were utilized to advance the access into the GDA. Selective GDA angiogram performed. GDA: The GDA and gastroepiploic vasculature are  all patent. Again, no active arterial bleeding noted but there is 1 dilated tortuous branch from the lower GDA which is suspicious for a bleeding source. GDA micro coil embolization: Microcatheter was advanced to the dilated pancreaticoduodenal branch. 4 and 5 mm interlock micro coils were deployed for embolization. Following embolization, the GDA is occluded. Repeat celiac angiogram confirms preserved patency of the hepatic vasculature and GDA occlusion. Catheter removed. Right common femoral artery access closed with the ExoSeal device and manual compression. No immediate complication. Patient tolerated the procedure well. IMPRESSION: Dilated branches of the pancreaticoduodenal arcade noted off of the mid to distal GDA. Although no active arterial bleeding demonstrated preventative GDA micro coil embolization performed as detailed above for the known large duodenal ulcer by endoscopy. Electronically Signed   By: Jerilynn Mages.  Shick M.D.   On: 11/16/2019 16:11   IR EMBO ART  VEN HEMORR LYMPH EXTRAV  INC GUIDE ROADMAPPING  Result Date: 11/16/2019 INDICATION: Bleeding duodenal ulcer by endoscopy, blood-loss anemia EXAM: ULTRASOUND GUIDANCE FOR VASCULAR ACCESS CELIAC AND GDA CATHETERIZATIONS AND ANGIOGRAMS GDA MICRO COIL EMBOLIZATION MEDICATIONS: 1% lidocaine local. ANESTHESIA/SEDATION: Moderate (conscious) sedation was employed during this procedure. A total of Versed 1.5 mg and Fentanyl 50 mcg was administered intravenously. Moderate Sedation Time: 40 minutes. The  patient's level of consciousness and vital signs were monitored continuously by radiology nursing throughout the procedure under my direct supervision. CONTRAST:  70 cc omni 300 FLUOROSCOPY TIME:  Fluoroscopy Time: 9 minutes 0 seconds (3,066 mGy). COMPLICATIONS: None immediate. PROCEDURE: Informed consent was obtained from the patient following explanation of the procedure, risks, benefits and alternatives. The patient understands, agrees and consents for the procedure. All questions were addressed. A time out was performed prior to the initiation of the procedure. Maximal barrier sterile technique utilized including caps, mask, sterile gowns, sterile gloves, large sterile drape, hand hygiene, and Betadine prep. Under sterile conditions and local anesthesia, ultrasound micropuncture access performed of the right common femoral artery. Images obtained for documentation. Five French sheath inserted over a guidewire. C2 catheter advanced and utilized to select the celiac origin. Celiac angiogram performed. Celiac: The celiac origin is widely patent. The splenic, left gastric, hepatic vasculature, and GDA are all visualized and patent. No active arterial GI bleeding demonstrated. However there are dilated branches within the pancreaticoduodenal arcade which are suspicious for sites of bleeding given the known large duodenal ulcer. Renegade STC microcatheter and a GT single angle Glidewire were utilized to advance the access into the GDA. Selective GDA angiogram performed. GDA: The GDA and gastroepiploic vasculature are all patent. Again, no active arterial bleeding noted but there is 1 dilated tortuous branch from the lower GDA which is suspicious for a bleeding source. GDA micro coil embolization: Microcatheter was advanced to the dilated pancreaticoduodenal branch. 4 and 5 mm interlock micro coils were deployed for embolization. Following embolization, the GDA is occluded. Repeat celiac angiogram confirms preserved  patency of the hepatic vasculature and GDA occlusion. Catheter removed. Right common femoral artery access closed with the ExoSeal device and manual compression. No immediate complication. Patient tolerated the procedure well. IMPRESSION: Dilated branches of the pancreaticoduodenal arcade noted off of the mid to distal GDA. Although no active arterial bleeding demonstrated preventative GDA micro coil embolization performed as detailed above for the known large duodenal ulcer by endoscopy. Electronically Signed   By: Jerilynn Mages.  Shick M.D.   On: 11/16/2019 16:11        Scheduled Meds: . atorvastatin  10 mg Oral Daily  . chlorhexidine  15 mL Mouth Rinse BID  . Chlorhexidine Gluconate Cloth  6 each Topical Daily  . feeding supplement (GLUCERNA SHAKE)  237 mL Oral BID BM  . insulin aspart  0-6 Units Subcutaneous TID WC  . mouth rinse  15 mL Mouth Rinse q12n4p  . pantoprazole (PROTONIX) IV  40 mg Intravenous Q12H  . sucralfate  1 g Oral TID WC & HS   Continuous Infusions: . sodium chloride 20 mL/hr at 11/16/19 1730  . lactated ringers Stopped (11/16/19 2000)     LOS: 12 days     Georgette Shell, MD 11/18/2019, 1:40 PM

## 2019-11-18 NOTE — Progress Notes (Signed)
UNASSIGNED PATIENT Subjective: Since I last evaluated the patient, she seems to be relatively stable.  She is tolerating clears well.  She denies having any abdominal pain.  She has not had a BM today.  She is passing flatus. She wants to know when she can go home. [facility] for her to have at home.  Her endoscopy showed a large duodenal ulcer which was HemoSplit and subsequently GDA embolization was done on 11/16/2019.  Hemoglobin today is 8.9 mg/dL.  She has received a total of 8 units of packed red blood cells so far.  Objective: Vital signs in last 24 hours: Temp:  [98.1 F (36.7 C)-98.6 F (37 C)] 98.2 F (36.8 C) (09/27 1200) Pulse Rate:  [85-100] 95 (09/27 1200) Resp:  [17-28] 21 (09/27 1200) BP: (91-131)/(26-67) 114/42 (09/27 1200) SpO2:  [93 %-100 %] 100 % (09/27 1200) Last BM Date: 11/17/19  Intake/Output from previous day: 09/26 0701 - 09/27 0700 In: 160 [I.V.:160] Out: 1375 [Urine:1250; Stool:125] Intake/Output this shift: Total I/O In: 540 [P.O.:420; I.V.:120] Out: -   General appearance: alert, cooperative, fatigued and morbidly obese Resp: clear to auscultation bilaterally Cardio: regular rate and rhythm, S1, S2 normal, no murmur, click, rub or gallop GI: soft, morbidly obese non-tender; bowel sounds normal; no masses,  no organomegaly Extremities: extremities normal, atraumatic, no cyanosis or edema  Lab Results: Recent Labs    11/16/19 0430 11/16/19 2200 11/17/19 0500  WBC 8.2  --  10.3  HGB 7.7* 9.6* 8.7*  HCT 24.3* 29.1* 26.8*  PLT 281  --  308   BMET Recent Labs    11/16/19 0430 11/17/19 0500  NA 147* 142  K 3.8 3.4*  CL 114* 114*  CO2 22 22  GLUCOSE 85 100*  BUN 58* 46*  CREATININE 3.31* 2.64*  CALCIUM 8.5* 8.0*  Studies/Results: IR Angiogram Visceral Selective  Result Date: 11/16/2019 INDICATION: Bleeding duodenal ulcer by endoscopy, blood-loss anemia EXAM: ULTRASOUND GUIDANCE FOR VASCULAR ACCESS  CELIAC AND GDA CATHETERIZATIONS AND ANGIOGRAMS GDA MICRO COIL EMBOLIZATION MEDICATIONS: 1% lidocaine local. ANESTHESIA/SEDATION: Moderate (conscious) sedation was employed during this procedure. A total of Versed 1.5 mg and Fentanyl 50 mcg was administered intravenously. Moderate Sedation Time: 40 minutes. The patient's level of consciousness and vital signs were monitored continuously by radiology nursing throughout the procedure under my direct supervision. CONTRAST:  70 cc omni 300 FLUOROSCOPY TIME:  Fluoroscopy Time: 9 minutes 0 seconds (3,066 mGy). COMPLICATIONS: None immediate. PROCEDURE: Informed consent was obtained from the patient following explanation of the procedure, risks, benefits and alternatives. The patient understands, agrees and consents for the procedure. All questions were addressed. A time out was performed prior to the initiation of the procedure. Maximal barrier sterile technique utilized including caps, mask, sterile gowns, sterile gloves, large sterile drape, hand hygiene, and Betadine prep. Under sterile conditions and local anesthesia, ultrasound micropuncture access performed of the right common femoral artery. Images obtained for documentation. Five French sheath inserted over a guidewire. C2 catheter advanced and utilized to select the celiac origin. Celiac angiogram performed. Celiac: The celiac origin is widely patent. The splenic, left gastric, hepatic vasculature, and GDA are all visualized and patent. No active arterial GI bleeding demonstrated. However there are dilated branches within the pancreaticoduodenal arcade which are suspicious for sites of bleeding given the known large duodenal ulcer. Renegade STC microcatheter and a GT single angle Glidewire were utilized to advance the access into the GDA. Selective GDA angiogram  performed. GDA: The GDA and gastroepiploic vasculature are all patent. Again, no active arterial bleeding noted but there is 1 dilated tortuous branch from  the lower GDA which is suspicious for a bleeding source. GDA micro coil embolization: Microcatheter was advanced to the dilated pancreaticoduodenal branch. 4 and 5 mm interlock micro coils were deployed for embolization. Following embolization, the GDA is occluded. Repeat celiac angiogram confirms preserved patency of the hepatic vasculature and GDA occlusion. Catheter removed. Right common femoral artery access closed with the ExoSeal device and manual compression. No immediate complication. Patient tolerated the procedure well. IMPRESSION: Dilated branches of the pancreaticoduodenal arcade noted off of the mid to distal GDA. Although no active arterial bleeding demonstrated preventative GDA micro coil embolization performed as detailed above for the known large duodenal ulcer by endoscopy. Electronically Signed   By: Jerilynn Mages.  Shick M.D.   On: 11/16/2019 16:11   IR Angiogram Selective Each Additional Vessel  Result Date: 11/16/2019 INDICATION: Bleeding duodenal ulcer by endoscopy, blood-loss anemia EXAM: ULTRASOUND GUIDANCE FOR VASCULAR ACCESS CELIAC AND GDA CATHETERIZATIONS AND ANGIOGRAMS GDA MICRO COIL EMBOLIZATION MEDICATIONS: 1% lidocaine local. ANESTHESIA/SEDATION: Moderate (conscious) sedation was employed during this procedure. A total of Versed 1.5 mg and Fentanyl 50 mcg was administered intravenously. Moderate Sedation Time: 40 minutes. The patient's level of consciousness and vital signs were monitored continuously by radiology nursing throughout the procedure under my direct supervision. CONTRAST:  70 cc omni 300 FLUOROSCOPY TIME:  Fluoroscopy Time: 9 minutes 0 seconds (3,066 mGy). COMPLICATIONS: None immediate. PROCEDURE: Informed consent was obtained from the patient following explanation of the procedure, risks, benefits and alternatives. The patient understands, agrees and consents for the procedure. All questions were addressed. A time out was performed prior to the initiation of the procedure.  Maximal barrier sterile technique utilized including caps, mask, sterile gowns, sterile gloves, large sterile drape, hand hygiene, and Betadine prep. Under sterile conditions and local anesthesia, ultrasound micropuncture access performed of the right common femoral artery. Images obtained for documentation. Five French sheath inserted over a guidewire. C2 catheter advanced and utilized to select the celiac origin. Celiac angiogram performed. Celiac: The celiac origin is widely patent. The splenic, left gastric, hepatic vasculature, and GDA are all visualized and patent. No active arterial GI bleeding demonstrated. However there are dilated branches within the pancreaticoduodenal arcade which are suspicious for sites of bleeding given the known large duodenal ulcer. Renegade STC microcatheter and a GT single angle Glidewire were utilized to advance the access into the GDA. Selective GDA angiogram performed. GDA: The GDA and gastroepiploic vasculature are all patent. Again, no active arterial bleeding noted but there is 1 dilated tortuous branch from the lower GDA which is suspicious for a bleeding source. GDA micro coil embolization: Microcatheter was advanced to the dilated pancreaticoduodenal branch. 4 and 5 mm interlock micro coils were deployed for embolization. Following embolization, the GDA is occluded. Repeat celiac angiogram confirms preserved patency of the hepatic vasculature and GDA occlusion. Catheter removed. Right common femoral artery access closed with the ExoSeal device and manual compression. No immediate complication. Patient tolerated the procedure well. IMPRESSION: Dilated branches of the pancreaticoduodenal arcade noted off of the mid to distal GDA. Although no active arterial bleeding demonstrated preventative GDA micro coil embolization performed as detailed above for the known large duodenal ulcer by endoscopy. Electronically Signed   By: Jerilynn Mages.  Shick M.D.   On: 11/16/2019 16:11   IR US  Guide Vasc Access Right  Result Date: 11/16/2019 INDICATION: Bleeding duodenal ulcer  by endoscopy, blood-loss anemia EXAM: ULTRASOUND GUIDANCE FOR VASCULAR ACCESS CELIAC AND GDA CATHETERIZATIONS AND ANGIOGRAMS GDA MICRO COIL EMBOLIZATION MEDICATIONS: 1% lidocaine local. ANESTHESIA/SEDATION: Moderate (conscious) sedation was employed during this procedure. A total of Versed 1.5 mg and Fentanyl 50 mcg was administered intravenously. Moderate Sedation Time: 40 minutes. The patient's level of consciousness and vital signs were monitored continuously by radiology nursing throughout the procedure under my direct supervision. CONTRAST:  70 cc omni 300 FLUOROSCOPY TIME:  Fluoroscopy Time: 9 minutes 0 seconds (3,066 mGy). COMPLICATIONS: None immediate. PROCEDURE: Informed consent was obtained from the patient following explanation of the procedure, risks, benefits and alternatives. The patient understands, agrees and consents for the procedure. All questions were addressed. A time out was performed prior to the initiation of the procedure. Maximal barrier sterile technique utilized including caps, mask, sterile gowns, sterile gloves, large sterile drape, hand hygiene, and Betadine prep. Under sterile conditions and local anesthesia, ultrasound micropuncture access performed of the right common femoral artery. Images obtained for documentation. Five French sheath inserted over a guidewire. C2 catheter advanced and utilized to select the celiac origin. Celiac angiogram performed. Celiac: The celiac origin is widely patent. The splenic, left gastric, hepatic vasculature, and GDA are all visualized and patent. No active arterial GI bleeding demonstrated. However there are dilated branches within the pancreaticoduodenal arcade which are suspicious for sites of bleeding given the known large duodenal ulcer. Renegade STC microcatheter and a GT single angle Glidewire were utilized to advance the access into the GDA. Selective GDA  angiogram performed. GDA: The GDA and gastroepiploic vasculature are all patent. Again, no active arterial bleeding noted but there is 1 dilated tortuous branch from the lower GDA which is suspicious for a bleeding source. GDA micro coil embolization: Microcatheter was advanced to the dilated pancreaticoduodenal branch. 4 and 5 mm interlock micro coils were deployed for embolization. Following embolization, the GDA is occluded. Repeat celiac angiogram confirms preserved patency of the hepatic vasculature and GDA occlusion. Catheter removed. Right common femoral artery access closed with the ExoSeal device and manual compression. No immediate complication. Patient tolerated the procedure well. IMPRESSION: Dilated branches of the pancreaticoduodenal arcade noted off of the mid to distal GDA. Although no active arterial bleeding demonstrated preventative GDA micro coil embolization performed as detailed above for the known large duodenal ulcer by endoscopy. Electronically Signed   By: Jerilynn Mages.  Shick M.D.   On: 11/16/2019 16:11   IR EMBO ART  VEN HEMORR LYMPH EXTRAV  INC GUIDE ROADMAPPING  Result Date: 11/16/2019 INDICATION: Bleeding duodenal ulcer by endoscopy, blood-loss anemia EXAM: ULTRASOUND GUIDANCE FOR VASCULAR ACCESS CELIAC AND GDA CATHETERIZATIONS AND ANGIOGRAMS GDA MICRO COIL EMBOLIZATION MEDICATIONS: 1% lidocaine local. ANESTHESIA/SEDATION: Moderate (conscious) sedation was employed during this procedure. A total of Versed 1.5 mg and Fentanyl 50 mcg was administered intravenously. Moderate Sedation Time: 40 minutes. The patient's level of consciousness and vital signs were monitored continuously by radiology nursing throughout the procedure under my direct supervision. CONTRAST:  70 cc omni 300 FLUOROSCOPY TIME:  Fluoroscopy Time: 9 minutes 0 seconds (3,066 mGy). COMPLICATIONS: None immediate. PROCEDURE: Informed consent was obtained from the patient following explanation of the procedure, risks, benefits and  alternatives. The patient understands, agrees and consents for the procedure. All questions were addressed. A time out was performed prior to the initiation of the procedure. Maximal barrier sterile technique utilized including caps, mask, sterile gowns, sterile gloves, large sterile drape, hand hygiene, and Betadine prep. Under sterile conditions and local anesthesia, ultrasound micropuncture  access performed of the right common femoral artery. Images obtained for documentation. Five French sheath inserted over a guidewire. C2 catheter advanced and utilized to select the celiac origin. Celiac angiogram performed. Celiac: The celiac origin is widely patent. The splenic, left gastric, hepatic vasculature, and GDA are all visualized and patent. No active arterial GI bleeding demonstrated. However there are dilated branches within the pancreaticoduodenal arcade which are suspicious for sites of bleeding given the known large duodenal ulcer. Renegade STC microcatheter and a GT single angle Glidewire were utilized to advance the access into the GDA. Selective GDA angiogram performed. GDA: The GDA and gastroepiploic vasculature are all patent. Again, no active arterial bleeding noted but there is 1 dilated tortuous branch from the lower GDA which is suspicious for a bleeding source. GDA micro coil embolization: Microcatheter was advanced to the dilated pancreaticoduodenal branch. 4 and 5 mm interlock micro coils were deployed for embolization. Following embolization, the GDA is occluded. Repeat celiac angiogram confirms preserved patency of the hepatic vasculature and GDA occlusion. Catheter removed. Right common femoral artery access closed with the ExoSeal device and manual compression. No immediate complication. Patient tolerated the procedure well. IMPRESSION: Dilated branches of the pancreaticoduodenal arcade noted off of the mid to distal GDA. Although no active arterial bleeding demonstrated preventative GDA micro  coil embolization performed as detailed above for the known large duodenal ulcer by endoscopy. Electronically Signed   By: Jerilynn Mages.  Shick M.D.   On: 11/16/2019 16:11   Medications: I have reviewed the patient's current medications.  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 Will watch her for another day and then advance her diet as tolerated.  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 3) Morbid obesity.  LOS: 12 days   Juanita Craver 11/18/2019, 12:52 PM

## 2019-11-19 LAB — BASIC METABOLIC PANEL
Anion gap: 8 (ref 5–15)
BUN: 28 mg/dL — ABNORMAL HIGH (ref 8–23)
CO2: 22 mmol/L (ref 22–32)
Calcium: 7.8 mg/dL — ABNORMAL LOW (ref 8.9–10.3)
Chloride: 115 mmol/L — ABNORMAL HIGH (ref 98–111)
Creatinine, Ser: 2.04 mg/dL — ABNORMAL HIGH (ref 0.44–1.00)
GFR calc Af Amer: 29 mL/min — ABNORMAL LOW (ref >60)
GFR calc non Af Amer: 25 mL/min — ABNORMAL LOW (ref >60)
Glucose, Bld: 102 mg/dL — ABNORMAL HIGH (ref 70–99)
Potassium: 3.6 mmol/L (ref 3.5–5.1)
Sodium: 145 mmol/L (ref 135–145)

## 2019-11-19 LAB — GLUCOSE, CAPILLARY
Glucose-Capillary: 101 mg/dL — ABNORMAL HIGH (ref 70–99)
Glucose-Capillary: 93 mg/dL (ref 70–99)
Glucose-Capillary: 102 mg/dL — ABNORMAL HIGH (ref 70–99)
Glucose-Capillary: 96 mg/dL (ref 70–99)

## 2019-11-19 LAB — CBC
HCT: 27.3 % — ABNORMAL LOW (ref 36.0–46.0)
Hemoglobin: 8.7 g/dL — ABNORMAL LOW (ref 12.0–15.0)
MCH: 28.9 pg (ref 26.0–34.0)
MCHC: 31.9 g/dL (ref 30.0–36.0)
MCV: 90.7 fL (ref 80.0–100.0)
Platelets: 355 10*3/uL (ref 150–400)
RBC: 3.01 MIL/uL — ABNORMAL LOW (ref 3.87–5.11)
RDW: 19.3 % — ABNORMAL HIGH (ref 11.5–15.5)
WBC: 9.5 10*3/uL (ref 4.0–10.5)
nRBC: 0 % (ref 0.0–0.2)

## 2019-11-19 NOTE — Progress Notes (Signed)
PT Cancellation Note  Patient Details Name: [name] [name] MRN: [id number] DOB: 06-10-1955   Cancelled Treatment:    Reason Eval/Treat Not Completed: Medical issues which prohibited therapy (pt is nauseous, stated she's not able to do PT now. Will follow.)   Philomena Doheny PT 11/19/2019  Acute Rehabilitation Services Pager [phone] Office [phone]

## 2019-11-19 NOTE — Progress Notes (Signed)
Subjective: Complaining of nausea.  Objective: Vital signs in last 24 hours: Temp:  [97.5 F (36.4 C)-98.5 F (36.9 C)] 98.5 F (36.9 C) (09/28 0600) Pulse Rate:  [88-97] 97 (09/28 0600) Resp:  [16-20] 20 (09/28 0600) BP: (115-144)/(50-55) 128/50 (09/28 0600) SpO2:  [98 %-100 %] 98 % (09/28 0600) Weight:  [135.6 kg] 135.6 kg (09/28 0500) Last BM Date: 11/18/19  Intake/Output from previous day: 09/27 0701 - 09/28 0700 In: 1185 [P.O.:656; I.V.:479; IV Piggyback:50] Out: 1465 [Urine:1425; Stool:40] Intake/Output this shift: Total I/O In: 120 [I.V.:120] Out: 600 [Urine:600]  General appearance: fatigued appearing GI: soft, non-tender; bowel sounds normal; no masses,  no organomegaly  Lab Results: Recent Labs    11/17/19 0500 11/18/19 0500 11/19/19 0301  WBC 10.3 9.8 9.5  HGB 8.7* 8.9* 8.7*  HCT 26.8* 27.7* 27.3*  PLT 308 335 355   BMET Recent Labs    11/17/19 0500 11/18/19 0500 11/19/19 0301  NA 142 141 145  K 3.4* 3.3* 3.6  CL 114* 113* 115*  CO2 22 21* 22  GLUCOSE 100* 142* 102*  BUN 46* 32* 28*  CREATININE 2.64* 2.21* 2.04*  CALCIUM 8.0* 7.7* 7.8*   LFT No results for input(s): PROT, ALBUMIN, AST, ALT, ALKPHOS, BILITOT, BILIDIR, IBILI in the last 72 hours. PT/INR No results for input(s): LABPROT, INR in the last 72 hours. Hepatitis Panel No results for input(s): HEPBSAG, HCVAB, HEPAIGM, HEPBIGM in the last 72 hours. C-Diff No results for input(s): CDIFFTOX in the last 72 hours. Fecal Lactopherrin No results for input(s): FECLLACTOFRN in the last 72 hours.  Studies/Results: No results found.  Medications:  Scheduled: . atorvastatin  10 mg Oral Daily  . chlorhexidine  15 mL Mouth Rinse BID  . Chlorhexidine Gluconate Cloth  6 each Topical Daily  . feeding supplement (GLUCERNA SHAKE)  237 mL Oral BID BM  . insulin aspart  0-6 Units Subcutaneous TID WC  . mouth rinse  15 mL Mouth Rinse q12n4p  . pantoprazole (PROTONIX) IV  40 mg Intravenous Q12H   . sucralfate  1 g Oral TID WC & HS   Continuous: . sodium chloride 20 mL/hr at 11/19/19 0600  . lactated ringers Stopped (11/16/19 2000)    Assessment/Plan: 1) DU with visible vessel s/p GDA embolization. 2) Anemia - stable.   From the bleeding standpoint she is well.  It appears that her HGB stabilized.  No further GI intervention is necessary at this time.  Plan: 1) Continue with supportive care. 2) Signing off.  LOS: 13 days   Eliaz Fout D 11/19/2019, 6:17 PM

## 2019-11-19 NOTE — Progress Notes (Signed)
Continues to refuse nocturnal CPAP tonight.

## 2019-11-19 NOTE — TOC Progression Note (Signed)
Transition of Care [facility]) - Progression Note    Patient Details  Name: [name] [name] MRN: [id number] Date of Birth: 1955/04/07  [facility] CM/SW Contact  Ross Ludwig, [place] Phone Number: 11/19/2019, 5:32 PM  Clinical Narrative:     CSW spoke to [place], they are not able to accept patient back, CSW working on trying to find a different facility.  CSW to continue to follow patient's progress throughout discharge planning.   Expected Discharge Plan: [place] Barriers to Discharge: Continued Medical Work up, Ship broker  Expected Discharge Plan and Services Expected Discharge Plan: [place]: [place] arrangements for the past 2 months: [place], Single Family Home Expected Discharge Date:  (unknown)                 DME Agency: NA       HH Arranged: NA           Social Determinants of Health (SDOH) Interventions    Readmission Risk Interventions Readmission Risk Prevention Plan 11/08/2019  Transportation Screening Complete  Medication Review [employer]) Referral to Pharmacy  PCP or Specialist appointment within 3-5 days of discharge Complete  HRI or [place] Complete  SW Recovery Care/Counseling Consult Complete  Palliative Care Screening Not Applicable  Skilled Nursing Facility Complete  Some recent data might be hidden

## 2019-11-19 NOTE — Progress Notes (Signed)
PROGRESS NOTE    [name] [name]  ZES:[id number] DOB: [birth year] DOA: 11/05/2019 PCP: [name], MD    Brief Narrative:64 year old female with super morbid obesity, hypertension, type 2 diabetes, stage IV chronic kidney disease with baseline creatinine about 1.7, recent left leg injury and fracture recovering at the skilled nursing facility brought to the ER on 9/14 for confusion.  Patient was reportedly unresponsive to verbal stimuli and subsequently was incoherent.  In the emergency room, she improved spontaneously and responded to questions appropriately.  Patient is on multiple medications including narcotics and benzodiazepine.  Admitted with mild acute metabolic encephalopathy and acute renal failure. Patient continued to have diarrhea 11/15/2019 on the evening of 11/10/2019 patient started having bloody stools with frank bleeding per rectum with drop in hemoglobin she received 6 unit of packed RBC at that time she had colonoscopy done on 11/14/2019 showed diverticuli in the ascending colon otherwise with normal exam however it was not an optimal study.  Patient continues with slow drifting down of hemoglobin received on the unit of packed RBC on 11/14/2019 so far she has received a total of 8 units of packed RBC.   On 11/16/2019 patient had GDA embolization by interventional radiology for ongoing GI bleed. Assessment & Plan:   Principal Problem:   Acute renal failure (HCC) Active Problems:   Type 2 diabetes mellitus with stage 4 chronic kidney disease (HCC)   Metabolic encephalopathy   CKD (chronic kidney disease) stage 4, GFR 15-29 ml/min (HCC)   Pressure injury of skin   Morbid obesity (HCC)   GI bleeding   Encephalopathy acute   Vaginal discharge   Anemia associated with acute blood loss   #1 AKI with CKD stage IV this is thought to be due to multifactorial causes including hypotension multiple medications including chlorthalidone.  It appears her creatinine is improving  after Lasix challenge.  Nephrology was consulted they signed off on 11/17/2019.  Her creatinine has improved to 2.04 from 2.21 from 2.64 from 6.4 on admission.  Now she gets as needed Lasix.  Lasix was stopped due to hypotension.  She is positive for 4.5 L from  5 L down from 6 L down from 9.5 L yesterday.   Her blood pressure is still soft even though she has chronic lower extremity edema I have not given her any Lasix in the last 2 days.  #2  Acute on chronic anemia secondary to upper GI bleed she received a total of 8  units of packed RBC.  Patient  had colonoscopy 9/23 no evidence of active bleeding was noted. egd 9/24 -normal esophagus, red blood in the gastric body and in the antrum and pylorus, adherent clot duodenal ulcer injected with hemostatic spray.  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  Hemoglobin 8.9 down from 9.6 patient has rectal tube with black stools.  No fresh blood noted in the rectal tube.  Black stools could be old blood which is still in the GI tract. Will DC rectal tube today.  #3 acute metabolic encephalopathy seems to have resolved, this is thought to be likely related to polypharmacy.  Prior to admission patient was on Percocet hydroxyzine Cymbalta Neurontin and Xanax at the nursing home.  These medications have been on hold mostly except for low-dose Percocet and intermittent Atarax that have been restarted.  She is awake and alert today and answers all my questions appropriately.  #4 type 2 diabetes with hypoglycemic episodes on SSI. CBG (last 3)  Recent  Labs    11/19/19 0740 11/19/19 1128 11/19/19 1550  GLUCAP 101* 93 102*    #5 status post UTI treated with Rocephin for 3 days.  #6 mild hypokalemia and hypocalcemia replete    Estimated body mass index is 54.69 kg/m as calculated from the following:   Height as of this encounter: 5\' 2"  (1.575 m).   Weight as of this encounter: 135.6 kg.  DVT prophylaxis: SCD code Status:  Full code Family Communication: None at bedside Disposition Plan:  Status is: Inpatient  Dispo:  Patient From: [facility]  Planned Disposition: [place] patient came from SNF however she does not want to go back to the SNF that she came from.  Expected discharge date: Unknown  medically stable for discharge: No she should be able to be discharged in the next 1 to 2 days.  Consultants: GI nephrology   Procedures: A-line, subclavian line Antimicrobials: Anti-infectives (From admission, onward)   Start     Dose/Rate Route Frequency Ordered Stop   11/06/19 0600  metroNIDAZOLE (FLAGYL) IVPB 500 mg  Status:  Discontinued        500 mg 100 mL/hr over 60 Minutes Intravenous Every 8 hours 11/06/19 0417 11/11/19 1012   11/06/19 0500  cefTRIAXone (ROCEPHIN) 1 g in sodium chloride 0.9 % 100 mL IVPB  Status:  Discontinued        1 g 200 mL/hr over 30 Minutes Intravenous Every 24 hours 11/06/19 0417 11/09/19 1029       Subjective: Rectal tube still in place with black stools She is resting in bed, somewhat frustrated that she cannot go home and take care of herself  Objective: Vitals:   11/18/19 2232 11/19/19 0137 11/19/19 0500 11/19/19 0600  BP: (!) 139/55 (!) 144/55  (!) 128/50  Pulse: 96 95  97  Resp: 16 20  20   Temp: 98.3 F (36.8 C) 98.1 F (36.7 C)  98.5 F (36.9 C)  TempSrc: Oral Oral  Oral  SpO2: 99% 98%  98%  Weight:   135.6 kg   Height:        Intake/Output Summary (Last 24 hours) at 11/19/2019 1608 Last data filed at 11/19/2019 5809 Gross per 24 hour  Intake 278.99 ml  Output 1040 ml  Net -761.01 ml   Filed Weights   11/16/19 0500 11/17/19 0500 11/19/19 0500  Weight: (!) 189 kg (!) 188.2 kg 135.6 kg    Examination- General exam: Appears calm and comfortable  Respiratory system: Clear to auscultation. Respiratory effort normal. Cardiovascular system: S1 & S2 heard, RRR. No JVD, murmurs, rubs, gallops or clicks. No pedal  edema. Gastrointestinal system: Abdomen is distended, soft and nontender. No organomegaly or masses felt. Normal bowel sounds heard. Central nervous system: Alert and oriented. No focal neurological deficits. Extremities: 2+ pitting edema Skin: No rashes, lesions or ulcers Psychiatry: Judgement and insight appear normal. Mood & affect appropriate.     Data Reviewed: I have personally reviewed following labs and imaging studies  CBC: Recent Labs  Lab 11/16/19 0430 11/16/19 2200 11/17/19 0500 11/18/19 0500 11/19/19 0301  WBC 8.2  --  10.3 9.8 9.5  HGB 7.7* 9.6* 8.7* 8.9* 8.7*  HCT 24.3* 29.1* 26.8* 27.7* 27.3*  MCV 87.7  --  87.6 89.9 90.7  PLT 281  --  308 335 983   Basic Metabolic Panel: Recent Labs  Lab 11/13/19 0257 11/14/19 0240 11/15/19 0509 11/16/19 0430 11/17/19 0500 11/18/19 0500 11/19/19 0301  NA 140   < >  144 147* 142 141 145  K 4.2   < > 4.0 3.8 3.4* 3.3* 3.6  CL 116*   < > 115* 114* 114* 113* 115*  CO2 18*   < > 21* 22 22 21* 22  GLUCOSE 145*   < > 87 85 100* 142* 102*  BUN 77*   < > 62* 58* 46* 32* 28*  CREATININE 4.27*   < > 3.49* 3.31* 2.64* 2.21* 2.04*  CALCIUM 7.7*   < > 8.3* 8.5* 8.0* 7.7* 7.8*  PHOS 4.2  --   --   --   --   --   --    < > = values in this interval not displayed.   GFR: Estimated Creatinine Clearance: 36.1 mL/min (A) (by C-G formula based on SCr of 2.04 mg/dL (H)). Liver Function Tests: Recent Labs  Lab 11/13/19 0257  ALBUMIN 2.0*   No results for input(s): LIPASE, AMYLASE in the last 168 hours. No results for input(s): AMMONIA in the last 168 hours. Coagulation Profile: No results for input(s): INR, PROTIME in the last 168 hours. Cardiac Enzymes: No results for input(s): CKTOTAL, CKMB, CKMBINDEX, TROPONINI in the last 168 hours. BNP (last 3 results) No results for input(s): PROBNP in the last 8760 hours. HbA1C: No results for input(s): HGBA1C in the last 72 hours. CBG: Recent Labs  Lab 11/18/19 1722 11/18/19 2236  11/19/19 0740 11/19/19 1128 11/19/19 1550  GLUCAP 104* 119* 101* 93 102*   Lipid Profile: No results for input(s): CHOL, HDL, LDLCALC, TRIG, CHOLHDL, LDLDIRECT in the last 72 hours. Thyroid Function Tests: No results for input(s): TSH, T4TOTAL, FREET4, T3FREE, THYROIDAB in the last 72 hours. Anemia Panel: No results for input(s): VITAMINB12, FOLATE, FERRITIN, TIBC, IRON, RETICCTPCT in the last 72 hours. Sepsis Labs: No results for input(s): PROCALCITON, LATICACIDVEN in the last 168 hours.  Recent Results (from the past 240 hour(s))  MRSA PCR Screening     Status: None   Collection Time: 11/14/19  6:30 PM   Specimen: Nasopharyngeal  Result Value Ref Range Status   MRSA by PCR NEGATIVE NEGATIVE Final    Comment:        The GeneXpert MRSA Assay (FDA approved for NASAL specimens only), is one component of a comprehensive MRSA colonization surveillance program. It is not intended to diagnose MRSA infection nor to guide or monitor treatment for MRSA infections. Performed at [facility], [place] [address] [place], [place] [address]          Radiology Studies: No results found.      Scheduled Meds: . atorvastatin  10 mg Oral Daily  . chlorhexidine  15 mL Mouth Rinse BID  . Chlorhexidine Gluconate Cloth  6 each Topical Daily  . feeding supplement (GLUCERNA SHAKE)  237 mL Oral BID BM  . insulin aspart  0-6 Units Subcutaneous TID WC  . mouth rinse  15 mL Mouth Rinse q12n4p  . pantoprazole (PROTONIX) IV  40 mg Intravenous Q12H  . sucralfate  1 g Oral TID WC & HS   Continuous Infusions: . sodium chloride 20 mL/hr at 11/19/19 0600  . lactated ringers Stopped (11/16/19 2000)     LOS: 13 days     Georgette Shell, MD 11/19/2019, 4:08 PM

## 2019-11-20 LAB — GLUCOSE, CAPILLARY
Glucose-Capillary: 80 mg/dL (ref 70–99)
Glucose-Capillary: 82 mg/dL (ref 70–99)
Glucose-Capillary: 68 mg/dL — ABNORMAL LOW (ref 70–99)
Glucose-Capillary: 92 mg/dL (ref 70–99)
Glucose-Capillary: 99 mg/dL (ref 70–99)

## 2019-11-20 LAB — CBC
HCT: 28 % — ABNORMAL LOW (ref 36.0–46.0)
Hemoglobin: 8.8 g/dL — ABNORMAL LOW (ref 12.0–15.0)
MCH: 28.3 pg (ref 26.0–34.0)
MCHC: 31.4 g/dL (ref 30.0–36.0)
MCV: 90 fL (ref 80.0–100.0)
Platelets: 407 10*3/uL — ABNORMAL HIGH (ref 150–400)
RBC: 3.11 MIL/uL — ABNORMAL LOW (ref 3.87–5.11)
RDW: 19.6 % — ABNORMAL HIGH (ref 11.5–15.5)
WBC: 8 10*3/uL (ref 4.0–10.5)
nRBC: 0 % (ref 0.0–0.2)

## 2019-11-20 NOTE — Progress Notes (Signed)
PT Cancellation Note  Patient Details Name: [name] [name] MRN: [id number] DOB: Nov 28, 1955   Cancelled Treatment:    Reason Eval/Treat Not Completed: Patient declined, no reason specified Pt declines to participate.  Pt lying supine and looks very uncomfortable however would not allow PT to assist with repositioning of adjustment of HOB.  Pt states, "I just don't want to" when asked why.  Pt encouraged to start movement/mobility and just stated "okay" multiple times to reason for being more mobile.  Pt has been NWB on L LE for weeks and this appears to also be limiting her progress.  Pt's leg fracture being managed conservatively and may benefit from f/u visit or consult from orthopaedic MD in hopes of possible upgrade of weight bearing status.   Goldia Ligman,KATHrine E 11/20/2019, 11:31 AM Arlyce Dice, DPT Acute Rehabilitation Services Pager: [phone] Office: [phone]

## 2019-11-20 NOTE — Progress Notes (Signed)
PROGRESS NOTE  [name]  QIW:[id number] DOB: 15-Aug-1955 DOA: 11/05/2019 PCP: [name], MD   Brief Narrative: [name] [name] is a 64 year old female withsupermorbid obesity, hypertension, type 2 diabetes, stage IV chronic kidney disease with baseline creatinine about 1.7, recent left leg injury and fracture recovering at the skilled nursing facility brought to the ER on 9/14 for confusion. Patient was reportedly unresponsive to verbal stimuli and subsequently was incoherent. In the emergency room, she improved spontaneously and responded to questions appropriately. Patient is on multiple medications including narcotics and benzodiazepine. Admitted with mild acute metabolic encephalopathy and acute renal failure. Patient continued to have diarrhea 11/15/2019 on the evening of 11/10/2019 patient started having bloody stools with frank bleeding per rectum with drop in hemoglobin she received 6 unit of packed RBC at that time she had colonoscopy done on 11/14/2019 showed diverticuli in the ascending colon otherwise with normal exam however it was not an optimal study.  Patient continues with slow drifting down of hemoglobin received on the unit of packed RBC on 11/14/2019 so far she has received a total of 8 units of packed RBC.   On 11/16/2019 patient had GDA embolization by interventional radiology for ongoing GI bleed. Hemoglobin has stabilized since that time. GI has signed off. Plan is for return to SNF for rehabilitation.   Assessment & Plan: Principal Problem:   Acute renal failure (HCC) Active Problems:   Type 2 diabetes mellitus with stage 4 chronic kidney disease (HCC)   Metabolic encephalopathy   CKD (chronic kidney disease) stage 4, GFR 15-29 ml/min (HCC)   Pressure injury of skin   Morbid obesity (HCC)   GI bleeding   Encephalopathy acute   Vaginal discharge   Anemia associated with acute blood loss  AKI with CKD stage IV: Improving with resolution of  hypotension, withholding thiazide diuretic.  - Overall CrCl is at/better than previous baseline now.  - Avoid nephrotoxins.  - Maintain euvolemia, avoid hypotension.  Acute on chronic symptomatic blood loss anemia secondary to upper GI bleed from duodenal ulcer s/p GDA: s/p 8u PRBCs total, hgb stable.  - Continue monitoring CBC daily, abdominal exam, and stool output for evidence of ischemia and/or return of bleeding  - F/u with GI as outpatient.   Acute metabolic encephalopathy: Resolved. Thought to be likely related to polypharmacy.  Prior to admission patient was on Percocet hydroxyzine Cymbalta Neurontin and Xanax at the nursing home.  These medications have been on hold mostly except for low-dose Percocet and intermittent Atarax that have been restarted.  She is awake and alert today and answers all my questions appropriately.  T2DM with hypoglycemic episodes on SSI.  UTI:  - S/p CTX x3 days  Hypokalemia: Supplemented  Hypocalcemia:  - Supplemented.   Obesity: Estimated body mass index is 54.69 kg/m as calculated from the following:   Height as of this encounter: 5\' 2"  (1.575 m).   Weight as of this encounter: 135.6 kg.  DVT prophylaxis: SCDs Code Status: Full Family Communication: None at bedside Disposition Plan:   Status is: Inpatient  Remains inpatient appropriate because:Unsafe d/c plan: Needs SNF. Pt's prior SNF isn't accepting the patient back.   Dispo:  Patient From: [place]  Planned Disposition: [place]  Expected discharge date: 11/20/19  Medically stable for discharge: Yes  Consultants:   GI  IR  Procedures:  EGD 9/24 -normal esophagus, red blood in the gastric body and in the antrum and pylorus, adherent clot duodenal ulcer  injected with hemostatic spray.   GDA embolization by interventional radiology 11/16/2019.   Antimicrobials:  None   Subjective: No complaints today, no bleeding. Denies abdominal pain.  +Nausea constant and worse with food but no vomiting.   Objective: Vitals:   11/19/19 0600 11/19/19 2130 11/20/19 0646 11/20/19 1424  BP: (!) 128/50 (!) 129/57 (!) 115/51 105/71  Pulse: 97 99 96 93  Resp: 20 18 17 20   Temp: 98.5 F (36.9 C) 98.6 F (37 C) 98.7 F (37.1 C) 98.2 F (36.8 C)  TempSrc: Oral     SpO2: 98% 91% 93% 99%  Weight:      Height:        Intake/Output Summary (Last 24 hours) at 11/20/2019 1745 Last data filed at 11/20/2019 1540 Gross per 24 hour  Intake 10 ml  Output 1100 ml  Net -1090 ml   Filed Weights   11/16/19 0500 11/17/19 0500 11/19/19 0500  Weight: (!) 189 kg (!) 188.2 kg 135.6 kg    IWL:NLGXQ female in no acute distress Pulm: Non-labored breathing supine. Clear to auscultation anteriorly CV: Regular rate and rhythm. No murmur, rub, or gallop. UTD JVD, Minimal pitting pedal edema. GI: Abdomen soft, non-tender, non-distended, with normoactive bowel sounds. No organomegaly or masses felt. Ext: Warm, no deformities Skin: No rashes, lesions or ulcers Neuro: Alert and oriented. No focal neurological deficits. Psych: Judgement and insight appear normal. Mood & affect appropriate.   Data Reviewed: I have personally reviewed following labs and imaging studies  CBC: Recent Labs  Lab 11/16/19 0430 11/16/19 0430 11/16/19 2200 11/17/19 0500 11/18/19 0500 11/19/19 0301 11/20/19 0510  WBC 8.2  --   --  10.3 9.8 9.5 8.0  HGB 7.7*   < > 9.6* 8.7* 8.9* 8.7* 8.8*  HCT 24.3*   < > 29.1* 26.8* 27.7* 27.3* 28.0*  MCV 87.7  --   --  87.6 89.9 90.7 90.0  PLT 281  --   --  308 335 355 407*   < > = values in this interval not displayed.   Basic Metabolic Panel: Recent Labs  Lab 11/15/19 0509 11/16/19 0430 11/17/19 0500 11/18/19 0500 11/19/19 0301  NA 144 147* 142 141 145  K 4.0 3.8 3.4* 3.3* 3.6  CL 115* 114* 114* 113* 115*  CO2 21* 22 22 21* 22  GLUCOSE 87 85 100* 142* 102*  BUN 62* 58* 46* 32* 28*  CREATININE 3.49* 3.31* 2.64* 2.21* 2.04*   CALCIUM 8.3* 8.5* 8.0* 7.7* 7.8*   GFR: Estimated Creatinine Clearance: 36.1 mL/min (A) (by C-G formula based on SCr of 2.04 mg/dL (H)). Liver Function Tests: No results for input(s): AST, ALT, ALKPHOS, BILITOT, PROT, ALBUMIN in the last 168 hours. No results for input(s): LIPASE, AMYLASE in the last 168 hours. No results for input(s): AMMONIA in the last 168 hours. Coagulation Profile: No results for input(s): INR, PROTIME in the last 168 hours. Cardiac Enzymes: No results for input(s): CKTOTAL, CKMB, CKMBINDEX, TROPONINI in the last 168 hours. BNP (last 3 results) No results for input(s): PROBNP in the last 8760 hours. HbA1C: No results for input(s): HGBA1C in the last 72 hours. CBG: Recent Labs  Lab 11/19/19 1550 11/19/19 2133 11/20/19 0802 11/20/19 1119 11/20/19 1658  GLUCAP 102* 96 80 82 68*   Lipid Profile: No results for input(s): CHOL, HDL, LDLCALC, TRIG, CHOLHDL, LDLDIRECT in the last 72 hours. Thyroid Function Tests: No results for input(s): TSH, T4TOTAL, FREET4, T3FREE, THYROIDAB in the last 72 hours. Anemia Panel: No  results for input(s): VITAMINB12, FOLATE, FERRITIN, TIBC, IRON, RETICCTPCT in the last 72 hours. Urine analysis:    Component Value Date/Time   COLORURINE AMBER (A) 11/06/2019 0130   APPEARANCEUR CLOUDY (A) 11/06/2019 0130   LABSPEC 1.017 11/06/2019 0130   PHURINE 5.0 11/06/2019 0130   GLUCOSEU NEGATIVE 11/06/2019 0130   HGBUR MODERATE (A) 11/06/2019 0130   BILIRUBINUR NEGATIVE 11/06/2019 0130   KETONESUR NEGATIVE 11/06/2019 0130   PROTEINUR 30 (A) 11/06/2019 0130   UROBILINOGEN 0.2 06/28/2014 1500   NITRITE NEGATIVE 11/06/2019 0130   LEUKOCYTESUR LARGE (A) 11/06/2019 0130   Recent Results (from the past 240 hour(s))  MRSA PCR Screening     Status: None   Collection Time: 11/14/19  6:30 PM   Specimen: Nasopharyngeal  Result Value Ref Range Status   MRSA by PCR NEGATIVE NEGATIVE Final    Comment:        The GeneXpert MRSA Assay  (FDA approved for NASAL specimens only), is one component of a comprehensive MRSA colonization surveillance program. It is not intended to diagnose MRSA infection nor to guide or monitor treatment for MRSA infections. Performed at [facility], [place] [address] [place], [place] [address]       Radiology Studies: No results found.  Scheduled Meds: . atorvastatin  10 mg Oral Daily  . chlorhexidine  15 mL Mouth Rinse BID  . Chlorhexidine Gluconate Cloth  6 each Topical Daily  . feeding supplement (GLUCERNA SHAKE)  237 mL Oral BID BM  . insulin aspart  0-6 Units Subcutaneous TID WC  . mouth rinse  15 mL Mouth Rinse q12n4p  . pantoprazole (PROTONIX) IV  40 mg Intravenous Q12H  . sucralfate  1 g Oral TID WC & HS   Continuous Infusions: . sodium chloride 20 mL/hr at 11/19/19 0600  . lactated ringers Stopped (11/16/19 2000)     LOS: 14 days   Time spent: 25 minutes.  Patrecia Pour, MD [facility] [email or web address] 11/20/2019, 5:45 PM

## 2019-11-21 ENCOUNTER — Inpatient Hospital Stay (HOSPITAL_COMMUNITY): Payer: No Typology Code available for payment source

## 2019-11-21 HISTORY — PX: IR REMOVAL TUN CV CATH W/O FL: IMG2289

## 2019-11-21 LAB — GLUCOSE, CAPILLARY
Glucose-Capillary: 71 mg/dL (ref 70–99)
Glucose-Capillary: 81 mg/dL (ref 70–99)
Glucose-Capillary: 90 mg/dL (ref 70–99)
Glucose-Capillary: 91 mg/dL (ref 70–99)

## 2019-11-21 LAB — CBC
HCT: 28.3 % — ABNORMAL LOW (ref 36.0–46.0)
Hemoglobin: 9.1 g/dL — ABNORMAL LOW (ref 12.0–15.0)
MCH: 28.7 pg (ref 26.0–34.0)
MCHC: 32.2 g/dL (ref 30.0–36.0)
MCV: 89.3 fL (ref 80.0–100.0)
Platelets: 453 10*3/uL — ABNORMAL HIGH (ref 150–400)
RBC: 3.17 MIL/uL — ABNORMAL LOW (ref 3.87–5.11)
RDW: 19.9 % — ABNORMAL HIGH (ref 11.5–15.5)
WBC: 7.9 10*3/uL (ref 4.0–10.5)
nRBC: 0 % (ref 0.0–0.2)

## 2019-11-21 LAB — SARS CORONAVIRUS 2 BY RT PCR (HOSPITAL ORDER, PERFORMED IN ~~LOC~~ HOSPITAL LAB): SARS Coronavirus 2: NEGATIVE

## 2019-11-21 MED ORDER — ALPRAZOLAM 0.5 MG PO TABS: 0.5000 mg | ORAL_TABLET | Freq: Three times a day (TID) | ORAL | 0 refills | Status: DC | PRN

## 2019-11-21 MED ORDER — PANTOPRAZOLE SODIUM 40 MG PO TBEC: 40.0000 mg | DELAYED_RELEASE_TABLET | Freq: Two times a day (BID) | ORAL | 0 refills | Status: DC

## 2019-11-21 MED ORDER — HYDROCODONE-ACETAMINOPHEN 5-325 MG PO TABS
1.0000 | ORAL_TABLET | Freq: Three times a day (TID) | ORAL | 0 refills | Status: DC | PRN
Start: 2019-11-21 — End: 2020-02-11

## 2019-11-21 MED ORDER — GABAPENTIN 600 MG PO TABS: 600.0000 mg | ORAL_TABLET | Freq: Two times a day (BID) | ORAL | Status: DC

## 2019-11-21 MED ORDER — ONDANSETRON 4 MG PO TBDP
4.0000 mg | ORAL_TABLET | Freq: Three times a day (TID) | ORAL | Status: DC | PRN
  Administered 2019-11-21 – 2019-11-22 (×2): 4 mg via ORAL
  Filled 2019-11-21 (×2): qty 1

## 2019-11-21 NOTE — Procedures (Signed)
Pt's tunneled right IJ central venous catheter was removed in its entirety without immediate complications. Gauze dressing was applied to site. EBL none.

## 2019-11-21 NOTE — TOC Progression Note (Addendum)
Transition of Care [facility]) - Progression Note    Patient Details  Name: [name] [name] MRN: [id number] Date of Birth: 04-21-1955  Transition of Care [facility]) CM/SW Contact  Ross Ludwig, [place] Phone Number:  11/21/2019, 4:36 PM  Clinical Narrative:     CSW informed patient and her sister [name] [name], that the only option is [facility] in [place].  Patient and her sister stated they do not want to go to [place], because they do not know anyone up there.  CSW spoke to patient, and her sister [name] [name] [phone] , she does not want to go to [facility] in [place].  CSW checked with [facility] SNF, and they have agreed to accept patient pending insurance approval.  CSW explained to patient and her sister if insurance denies her for SNF the only other options are pay privately or go home with home health.  Both sister and patient expressed understanding.  CSW spoke to [facility], and they should be able to accept patient for home health if she is denied SNF by insurance company.  CSW will have to get approval from [place] for home health services and will need updated therapy notes.  CSW updated attending physician, bedside nurse, and therapy department.    Expected Discharge Plan: [place] Barriers to Discharge: Continued Medical Work up, Ship broker  Expected Discharge Plan and Services Expected Discharge Plan: [place] Choice: [place] Living arrangements for the past 2 months: [place], [employer] Expected Discharge Date: 11/21/19                 DME Agency: NA       HH Arranged: NA           Social Determinants of Health (SDOH) Interventions    Readmission Risk Interventions Readmission Risk Prevention Plan 11/08/2019  Transportation Screening Complete  Medication Review Press photographer) Referral to Pharmacy  PCP or Specialist appointment within 3-5 days of  discharge Complete  HRI or [place] Complete  SW Recovery Care/Counseling Consult Complete  Palliative Care Screening Not [place] Complete  Some recent data might be hidden

## 2019-11-21 NOTE — Discharge Summary (Signed)
Physician Discharge Summary  [name] [name] ZDG:[id number] DOB: 07-Oct-1955 DOA: 11/05/2019  PCP: [name], MD  Admit date: 11/05/2019 Discharge date: 11/21/2019  Admitted From: SNF Disposition: SNF   Recommendations for Outpatient Follow-up:  1. Follow up with PCP at SNF 2. Follow up with GI for surveillance of duodenal ulcer. 3. Follow up with orthopedics for continued management (nonoperative) or left femoral fracture.   Home Health: N/A Equipment/Devices: None new Discharge Condition: Stable CODE STATUS: Full Diet recommendation: Heart healthy, carb-modified  Brief/Interim Summary: [name] [name] is a 64 year old female withsupermorbid obesity, hypertension, type 2 diabetes, stage IV chronic kidney disease with baseline creatinine about 1.7, recent left leg injury and fracture recovering at the skilled nursing facility brought to the ER on 9/14 for confusion. Patient was reportedly unresponsive to verbal stimuli and subsequently was incoherent. In the emergency room, she improved spontaneously and responded to questions appropriately. Patient is on multiple medications including narcotics and benzodiazepine. Admitted with mild acute metabolic encephalopathy and acute renal failure. Patient continued to have diarrhea 11/15/2019 on the evening of 11/10/2019 patient started having bloody stools with frank bleeding per rectum with drop in hemoglobin she received 6 unit of packed RBC at that time she had colonoscopy done on 11/14/2019 showed diverticuli in the ascending colon otherwise with normal exam however it was not an optimal study. Patient continues with slow drifting down of hemoglobin received on the unit of packed RBC on 11/14/2019 so far she has received a total of 8 units of packed RBC.  On 11/16/2019 patient had GDA embolization by interventional radiology for ongoing GI bleed. Hemoglobin has stabilized since that time. GI has signed off. Plan is for return to SNF  for rehabilitation.   Discharge Diagnoses:  Principal Problem:   Acute renal failure ([facility]) Active Problems:   Type 2 diabetes mellitus with stage 4 chronic kidney disease (HCC)   Metabolic encephalopathy   CKD (chronic kidney disease) stage 4, GFR 15-29 ml/min (HCC)   Pressure injury of skin   Morbid obesity (H[facility])   GI bleeding   Encephalopathy acute   Vaginal discharge   Anemia associated with acute blood loss  AKI with CKD stage IV: Improving with resolution of hypotension, withholding thiazide diuretic.  - Overall CrCl is at/better than previous baseline now.  - Avoid nephrotoxins.  - Maintain euvolemia, avoid hypotension.  Acute on chronic symptomatic blood loss anemia secondary to upper GI bleed from duodenal ulcer s/p GDA 9/25: s/p 8u PRBCs total. - Continue monitoring CBC. Hgb stable for several days prior to discharge.  - Continue PPI BID - F/u with GI as outpatient.   Acute metabolic encephalopathy: Resolved. Thought to be likely related to polypharmacy.  - Decrease gabapentin to 600mg  po BID from 1,800mg  qAM.  - Change oxycodone 10mg  to hydrocodone 5mg  dosing prn (has tolerated here) - Decrease benzodiazepine dosing  HTN: BP rising while holding thiazide, will restart at DC.  T2DM with hypoglycemic episodes on SSI. - Return to po medications at discharge.   HLD:  - Continue statin  UTI:  - S/p CTX x3 days  Hypokalemia: Supplemented  Hypocalcemia:  - Supplemented.   Left femoral fracture with partially healed malunion of her femoral condyle: Operative risk precludes surgery.  - LLE Weight Bearing: Non weight bearing Other Position/Activity Restrictions: Per Dr. Reather Littler 9/5 note: NWB pillow splints. Bed to chair stabilizing left knee. Need To rexray in three weeks. Most likely patient will be wheel chair bound even with healing.  Obesity: Estimated body mass index is 54.69 kg/m  EGD RESULTS 11/15/2019 Dr. Benson Norway:  Impression:               -  Normal esophagus.                           - Red blood in the gastric body, in the gastric                            antrum and in the pylorus.                           - Adherent clot duodenal ulcer with an adherent                            clot (Forrest Class IIb). Injected. hemostatic                            spray applied.                           - No specimens collected. Moderate Sedation:      Not Applicable - Patient had care per Anesthesia. Recommendation:           - Return patient to hospital ward for ongoing care.                           - NPO.                           - Continue present medications.                           - Follow HGB and transfuse as necessary.                           - ? Relook EGD in 2 days.  Discharge Instructions  Allergies as of 11/21/2019      Reactions   Morphine Itching, Rash   Penicillins Itching, Nausea And Vomiting, Rash, Hives   Has patient had a PCN reaction causing immediate rash, facial/tongue/throat swelling, SOB or lightheadedness with hypotension: No Has patient had a PCN reaction causing severe rash involving mucus membranes or skin necrosis: No Has patient had a PCN reaction that required hospitalization: No Has patient had a PCN reaction occurring within the last 10 years: No If all of the above answers are "NO", then may proceed with Cephalosporin use. Has patient had a PCN reaction causing immediate rash, facial/tongue/throat swelling, SOB or lightheadedness with hypotension: No Has patient had a PCN reaction causing severe rash involving mucus membranes or skin necrosis: No Has patient had a PCN reaction that required hospitalization: No Has patient had a PCN reaction occurring within the last 10 years: No If all of the above answers are "NO", then may proceed with Cephalosporin use.      Medication List    STOP taking these medications   oxyCODONE-acetaminophen 10-325 MG tablet Commonly known as: PERCOCET      TAKE these medications   allopurinol 100 MG tablet Commonly known as: ZYLOPRIM Take  100 mg by mouth 2 (two) times daily.   ALPRAZolam 0.5 MG tablet Commonly known as: XANAX Take 1 tablet (0.5 mg total) by mouth 3 (three) times daily as needed for anxiety. What changed:   medication strength  how much to take   atenolol-chlorthalidone 50-25 MG tablet Commonly known as: TENORETIC Take 1 tablet by mouth daily.   atorvastatin 10 MG tablet Commonly known as: LIPITOR Take 10 mg by mouth daily.   DULoxetine 30 MG capsule Commonly known as: CYMBALTA Take 30 mg by mouth 2 (two) times daily.   gabapentin 600 MG tablet Commonly known as: NEURONTIN Take 1 tablet (600 mg total) by mouth 2 (two) times daily. What changed:   how much to take  when to take this   HYDROcodone-acetaminophen 5-325 MG tablet Commonly known as: NORCO/VICODIN Take 1 tablet by mouth every 8 (eight) hours as needed for severe pain.   hydrOXYzine 50 MG tablet Commonly known as: ATARAX/VISTARIL Take 50 mg by mouth every 8 (eight) hours as needed for anxiety or itching.   pantoprazole 40 MG tablet Commonly known as: Protonix Take 1 tablet (40 mg total) by mouth 2 (two) times daily.   pioglitazone 30 MG tablet Commonly known as: ACTOS Take 30 mg by mouth daily.   senna 8.6 MG Tabs tablet Commonly known as: SENOKOT Take 1 tablet (8.6 mg total) by mouth daily.   sitaGLIPtin 100 MG tablet Commonly known as: JANUVIA Take 100 mg by mouth daily.   sorbitol 70 % Soln Take 30 mLs by mouth 2 (two) times daily. As a second choice for constipation if you do not stool with senna       Follow-up Information    [name], MD Follow up.   Specialty: Family Medicine Contact information: [place] [address] [phone]        Susa Day, MD. Schedule an appointment as soon as possible for a visit in 1 week(s).   Specialty: Orthopedic Surgery Contact  information: [address] [place] [address] [phone]        Carol Ada, MD. Schedule an appointment as soon as possible for a visit in 1 month(s).   Specialty: Gastroenterology Contact information: [place], [place] [address] [phone]              Allergies  Allergen Reactions  . Morphine Itching and Rash  . Penicillins Itching, Nausea And Vomiting, Rash and Hives    Has patient had a PCN reaction causing immediate rash, facial/tongue/throat swelling, SOB or lightheadedness with hypotension: No Has patient had a PCN reaction causing severe rash involving mucus membranes or skin necrosis: No Has patient had a PCN reaction that required hospitalization: No Has patient had a PCN reaction occurring within the last 10 years: No If all of the above answers are "NO", then may proceed with Cephalosporin use.  Has patient had a PCN reaction causing immediate rash, facial/tongue/throat swelling, SOB or lightheadedness with hypotension: No Has patient had a PCN reaction causing severe rash involving mucus membranes or skin necrosis: No Has patient had a PCN reaction that required hospitalization: No Has patient had a PCN reaction occurring within the last 10 years: No If all of the above answers are "NO", then may proceed with Cephalosporin use.     Consultations:  Nephrology  GI  IR  Procedures/Studies: CT ABDOMEN PELVIS WO CONTRAST  Result Date: 11/06/2019 CLINICAL DATA:  Abdominal pain.  Vaginal discharge EXAM: CT  ABDOMEN AND PELVIS WITHOUT CONTRAST TECHNIQUE: Multidetector CT imaging of the abdomen and pelvis was performed following the standard protocol without oral or IV contrast. COMPARISON:  October 18, 2019 FINDINGS: Lower chest: There is bibasilar atelectasis. Hepatobiliary: No focal liver lesions are appreciable on this noncontrast enhanced study. Gallbladder wall is not appreciably thickened. There is no biliary  duct dilatation. Pancreas: There is no pancreatic mass or inflammatory focus. Spleen: No splenic lesions are evident. Adrenals/Urinary Tract: Adrenals bilaterally appear normal. There is no evident renal mass on either side. Calcification along the posterior cortex of the left upper to mid kidney is a stable finding without associated mass effect. There is no evident hydronephrosis on either side. There is no appreciable renal or ureteral calculus on either side. Urinary bladder is midline with wall thickness within normal limits. Note that a Foley catheter is present within the bladder. Stomach/Bowel: There is moderate stool throughout the colon. There is no appreciable bowel wall or mesenteric thickening. There is no demonstrable bowel obstruction. There is no appreciable free air or portal venous air. The terminal ileum appears normal. Vascular/Lymphatic: There is no abdominal aortic aneurysm. There is aortic and iliac artery atherosclerotic calcification. No adenopathy is appreciable in the abdomen or pelvis. Reproductive: Uterus is absent.  No evident pelvic mass. Other: No periappendiceal region inflammatory change. No abscess or ascites in the abdomen or pelvis. There is apparent scarring throughout the left lateral abdominal wall. There is no fluid in this area. Musculoskeletal: Arthropathy in the lumbar spine is again noted. There is severe disc space narrowing at L4-5 with 5 mm of anterolisthesis of L5 on S1, stable. There are pars defects at L4 and L5 bilaterally. No blastic or lytic bone lesions. No intramuscular lesions are demonstrable. IMPRESSION: 1. No bowel wall thickening or bowel obstruction is currently appreciable. No abscess in the abdomen pelvis. No periappendiceal region inflammation evident. 2. No renal or ureteral calculus. No hydronephrosis. Calcification along the left kidney posteriorly likely represents previous trauma or inflammation, stable. Foley catheter within urinary bladder. 3.   Scarring lateral left abdomen, grossly stable. 4. Extensive lower lumbar arthropathy. Spondylolisthesis at L4-5. 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 5.  Aortic Atherosclerosis (ICD10-I70.0). 6.  Uterus absent. Electronically Signed   By: Lowella Grip III M.D.   On: 11/06/2019 08:44   DG Knee 2 Views Left  Result Date: 10/25/2019 CLINICAL DATA:  Follow-up femur fracture EXAM: LEFT KNEE - 1-2 VIEW COMPARISON:  08/16/2019 FINDINGS: Comminuted intra-articular distal femoral fracture involves the lateral femoral condyle. The fracture appears impacted. Some periosteal new bone formation presumably due to subacute process. Vascular calcification. Advanced medial and patellofemoral degenerative change. No large knee effusion. IMPRESSION: Comminuted, impacted intra-articular fracture involving the lateral femoral condyle and intercondylar region. Some periosteal new bone formation, suggesting subacute process. Electronically Signed   By: Donavan Foil M.D.   On: 10/25/2019 19:45   CT Head Wo Contrast  Result Date: 11/05/2019 CLINICAL DATA:  Mental status change EXAM: CT HEAD WITHOUT CONTRAST TECHNIQUE: Contiguous axial images were obtained from the base of the skull through the vertex without intravenous contrast. COMPARISON:  October 27, 2013 FINDINGS: Brain: No evidence of acute territorial infarction, hemorrhage, hydrocephalus,extra-axial collection or mass lesion/mass effect. Normal gray-white differentiation. Ventricles are normal in size and contour. Vascular: No hyperdense vessel or unexpected calcification. Skull: The skull is intact. No fracture or focal lesion identified. Sinuses/Orbits: The visualized paranasal sinuses and mastoid air cells are clear. The orbits and globes intact. Other:  None IMPRESSION: No acute intracranial abnormality. Electronically Signed   By: Prudencio Pair M.D.   On: 11/05/2019 18:48   IR Angiogram Visceral Selective  Result Date: 11/16/2019 INDICATION: Bleeding duodenal  ulcer by endoscopy, blood-loss anemia EXAM: ULTRASOUND GUIDANCE FOR VASCULAR ACCESS CELIAC AND GDA CATHETERIZATIONS AND ANGIOGRAMS GDA MICRO COIL EMBOLIZATION MEDICATIONS: 1% lidocaine local. ANESTHESIA/SEDATION: Moderate (conscious) sedation was employed during this procedure. A total of Versed 1.5 mg and Fentanyl 50 mcg was administered intravenously. Moderate Sedation Time: 40 minutes. The patient's level of consciousness and vital signs were monitored continuously by radiology nursing throughout the procedure under my direct supervision. CONTRAST:  70 cc omni 300 FLUOROSCOPY TIME:  Fluoroscopy Time: 9 minutes 0 seconds (3,066 mGy). COMPLICATIONS: None immediate. PROCEDURE: Informed consent was obtained from the patient following explanation of the procedure, risks, benefits and alternatives. The patient understands, agrees and consents for the procedure. All questions were addressed. A time out was performed prior to the initiation of the procedure. Maximal barrier sterile technique utilized including caps, mask, sterile gowns, sterile gloves, large sterile drape, hand hygiene, and Betadine prep. Under sterile conditions and local anesthesia, ultrasound micropuncture access performed of the right common femoral artery. Images obtained for documentation. Five French sheath inserted over a guidewire. C2 catheter advanced and utilized to select the celiac origin. Celiac angiogram performed. Celiac: The celiac origin is widely patent. The splenic, left gastric, hepatic vasculature, and GDA are all visualized and patent. No active arterial GI bleeding demonstrated. However there are dilated branches within the pancreaticoduodenal arcade which are suspicious for sites of bleeding given the known large duodenal ulcer. Renegade STC microcatheter and a GT single angle Glidewire were utilized to advance the access into the GDA. Selective GDA angiogram performed. GDA: The GDA and gastroepiploic vasculature are all patent.  Again, no active arterial bleeding noted but there is 1 dilated tortuous branch from the lower GDA which is suspicious for a bleeding source. GDA micro coil embolization: Microcatheter was advanced to the dilated pancreaticoduodenal branch. 4 and 5 mm interlock micro coils were deployed for embolization. Following embolization, the GDA is occluded. Repeat celiac angiogram confirms preserved patency of the hepatic vasculature and GDA occlusion. Catheter removed. Right common femoral artery access closed with the ExoSeal device and manual compression. No immediate complication. Patient tolerated the procedure well. IMPRESSION: Dilated branches of the pancreaticoduodenal arcade noted off of the mid to distal GDA. Although no active arterial bleeding demonstrated preventative GDA micro coil embolization performed as detailed above for the known large duodenal ulcer by endoscopy. Electronically Signed   By: Jerilynn Mages.  Shick M.D.   On: 11/16/2019 16:11   IR Angiogram Selective Each Additional Vessel  Result Date: 11/16/2019 INDICATION: Bleeding duodenal ulcer by endoscopy, blood-loss anemia EXAM: ULTRASOUND GUIDANCE FOR VASCULAR ACCESS CELIAC AND GDA CATHETERIZATIONS AND ANGIOGRAMS GDA MICRO COIL EMBOLIZATION MEDICATIONS: 1% lidocaine local. ANESTHESIA/SEDATION: Moderate (conscious) sedation was employed during this procedure. A total of Versed 1.5 mg and Fentanyl 50 mcg was administered intravenously. Moderate Sedation Time: 40 minutes. The patient's level of consciousness and vital signs were monitored continuously by radiology nursing throughout the procedure under my direct supervision. CONTRAST:  70 cc omni 300 FLUOROSCOPY TIME:  Fluoroscopy Time: 9 minutes 0 seconds (3,066 mGy). COMPLICATIONS: None immediate. PROCEDURE: Informed consent was obtained from the patient following explanation of the procedure, risks, benefits and alternatives. The patient understands, agrees and consents for the procedure. All questions  were addressed. A time out was performed prior to the initiation of the  procedure. Maximal barrier sterile technique utilized including caps, mask, sterile gowns, sterile gloves, large sterile drape, hand hygiene, and Betadine prep. Under sterile conditions and local anesthesia, ultrasound micropuncture access performed of the right common femoral artery. Images obtained for documentation. Five French sheath inserted over a guidewire. C2 catheter advanced and utilized to select the celiac origin. Celiac angiogram performed. Celiac: The celiac origin is widely patent. The splenic, left gastric, hepatic vasculature, and GDA are all visualized and patent. No active arterial GI bleeding demonstrated. However there are dilated branches within the pancreaticoduodenal arcade which are suspicious for sites of bleeding given the known large duodenal ulcer. Renegade STC microcatheter and a GT single angle Glidewire were utilized to advance the access into the GDA. Selective GDA angiogram performed. GDA: The GDA and gastroepiploic vasculature are all patent. Again, no active arterial bleeding noted but there is 1 dilated tortuous branch from the lower GDA which is suspicious for a bleeding source. GDA micro coil embolization: Microcatheter was advanced to the dilated pancreaticoduodenal branch. 4 and 5 mm interlock micro coils were deployed for embolization. Following embolization, the GDA is occluded. Repeat celiac angiogram confirms preserved patency of the hepatic vasculature and GDA occlusion. Catheter removed. Right common femoral artery access closed with the ExoSeal device and manual compression. No immediate complication. Patient tolerated the procedure well. IMPRESSION: Dilated branches of the pancreaticoduodenal arcade noted off of the mid to distal GDA. Although no active arterial bleeding demonstrated preventative GDA micro coil embolization performed as detailed above for the known large duodenal ulcer by  endoscopy. Electronically Signed   By: Jerilynn Mages.  Shick M.D.   On: 11/16/2019 16:11   IR Fluoro Guide CV Line Right  Result Date: 11/06/2019 INDICATION: 64 year old female with acute kidney injury in need of durable venous access. She is morbidly obese and presents for placement of a tunneled central venous catheter. EXAM: IR ULTRASOUND GUIDANCE VASC ACCESS RIGHT; IR RIGHT FLUORO GUIDE CV LINE MEDICATIONS: None ANESTHESIA/SEDATION: None FLUOROSCOPY TIME:  Fluoroscopy Time: 0 minutes 24 seconds (11 mGy). COMPLICATIONS: None immediate. PROCEDURE: Informed written consent was obtained from the patient after a thorough discussion of the procedural risks, benefits and alternatives. All questions were addressed. Maximal Sterile Barrier Technique was utilized including caps, mask, sterile gowns, sterile gloves, sterile drape, hand hygiene and skin antiseptic. A timeout was performed prior to the initiation of the procedure. The right internal jugular vein was interrogated with ultrasound and found to be widely patent. An image was obtained and stored for the medical record. Local anesthesia was attained by infiltration with 1% lidocaine. A small dermatotomy was made. Under real-time sonographic guidance, the vessel was punctured with a 21 gauge micropuncture needle. Using standard technique, the initial micro needle was exchanged over a 0.018 micro wire for a peel-away sheath. A suitable skin exit site was selected. Local anesthesia was again attained by infiltration with 1% lidocaine. A small dermatotomy was made. A dual lumen power injectable central venous catheter was then tunneled from the dermatotomy at the skin exit site to the dermatotomy overlying the venous access site. The catheter was then cut to length and advanced through the peel-away sheath and position with the tip in the upper right atrium. The catheter was secured to the skin with 0 Prolene suture. The catheter flushes and aspirates easily. The catheter was  flushed and capped. The dermatotomy at the venous access site was sealed with Dermabond. Sterile bandages were applied. IMPRESSION: Placement of a right IJ approach tunneled central venous catheter.  The catheter tips are in the upper right atrium and the catheter is ready for immediate use. Electronically Signed   By: Jacqulynn Cadet M.D.   On: 11/06/2019 16:51   IR US Guide Vasc Access Right  Result Date: 11/16/2019 INDICATION: Bleeding duodenal ulcer by endoscopy, blood-loss anemia EXAM: ULTRASOUND GUIDANCE FOR VASCULAR ACCESS CELIAC AND GDA CATHETERIZATIONS AND ANGIOGRAMS GDA MICRO COIL EMBOLIZATION MEDICATIONS: 1% lidocaine local. ANESTHESIA/SEDATION: Moderate (conscious) sedation was employed during this procedure. A total of Versed 1.5 mg and Fentanyl 50 mcg was administered intravenously. Moderate Sedation Time: 40 minutes. The patient's level of consciousness and vital signs were monitored continuously by radiology nursing throughout the procedure under my direct supervision. CONTRAST:  70 cc omni 300 FLUOROSCOPY TIME:  Fluoroscopy Time: 9 minutes 0 seconds (3,066 mGy). COMPLICATIONS: None immediate. PROCEDURE: Informed consent was obtained from the patient following explanation of the procedure, risks, benefits and alternatives. The patient understands, agrees and consents for the procedure. All questions were addressed. A time out was performed prior to the initiation of the procedure. Maximal barrier sterile technique utilized including caps, mask, sterile gowns, sterile gloves, large sterile drape, hand hygiene, and Betadine prep. Under sterile conditions and local anesthesia, ultrasound micropuncture access performed of the right common femoral artery. Images obtained for documentation. Five French sheath inserted over a guidewire. C2 catheter advanced and utilized to select the celiac origin. Celiac angiogram performed. Celiac: The celiac origin is widely patent. The splenic, left gastric,  hepatic vasculature, and GDA are all visualized and patent. No active arterial GI bleeding demonstrated. However there are dilated branches within the pancreaticoduodenal arcade which are suspicious for sites of bleeding given the known large duodenal ulcer. Renegade STC microcatheter and a GT single angle Glidewire were utilized to advance the access into the GDA. Selective GDA angiogram performed. GDA: The GDA and gastroepiploic vasculature are all patent. Again, no active arterial bleeding noted but there is 1 dilated tortuous branch from the lower GDA which is suspicious for a bleeding source. GDA micro coil embolization: Microcatheter was advanced to the dilated pancreaticoduodenal branch. 4 and 5 mm interlock micro coils were deployed for embolization. Following embolization, the GDA is occluded. Repeat celiac angiogram confirms preserved patency of the hepatic vasculature and GDA occlusion. Catheter removed. Right common femoral artery access closed with the ExoSeal device and manual compression. No immediate complication. Patient tolerated the procedure well. IMPRESSION: Dilated branches of the pancreaticoduodenal arcade noted off of the mid to distal GDA. Although no active arterial bleeding demonstrated preventative GDA micro coil embolization performed as detailed above for the known large duodenal ulcer by endoscopy. Electronically Signed   By: Jerilynn Mages.  Shick M.D.   On: 11/16/2019 16:11   IR US Guide Vasc Access Right  Result Date: 11/06/2019 INDICATION: 64 year old female with acute kidney injury in need of durable venous access. She is morbidly obese and presents for placement of a tunneled central venous catheter. EXAM: IR ULTRASOUND GUIDANCE VASC ACCESS RIGHT; IR RIGHT FLUORO GUIDE CV LINE MEDICATIONS: None ANESTHESIA/SEDATION: None FLUOROSCOPY TIME:  Fluoroscopy Time: 0 minutes 24 seconds (11 mGy). COMPLICATIONS: None immediate. PROCEDURE: Informed written consent was obtained from the patient after  a thorough discussion of the procedural risks, benefits and alternatives. All questions were addressed. Maximal Sterile Barrier Technique was utilized including caps, mask, sterile gowns, sterile gloves, sterile drape, hand hygiene and skin antiseptic. A timeout was performed prior to the initiation of the procedure. The right internal jugular vein was interrogated with ultrasound and found to  be widely patent. An image was obtained and stored for the medical record. Local anesthesia was attained by infiltration with 1% lidocaine. A small dermatotomy was made. Under real-time sonographic guidance, the vessel was punctured with a 21 gauge micropuncture needle. Using standard technique, the initial micro needle was exchanged over a 0.018 micro wire for a peel-away sheath. A suitable skin exit site was selected. Local anesthesia was again attained by infiltration with 1% lidocaine. A small dermatotomy was made. A dual lumen power injectable central venous catheter was then tunneled from the dermatotomy at the skin exit site to the dermatotomy overlying the venous access site. The catheter was then cut to length and advanced through the peel-away sheath and position with the tip in the upper right atrium. The catheter was secured to the skin with 0 Prolene suture. The catheter flushes and aspirates easily. The catheter was flushed and capped. The dermatotomy at the venous access site was sealed with Dermabond. Sterile bandages were applied. IMPRESSION: Placement of a right IJ approach tunneled central venous catheter. The catheter tips are in the upper right atrium and the catheter is ready for immediate use. Electronically Signed   By: Jacqulynn Cadet M.D.   On: 11/06/2019 16:51   DG Chest Portable 1 View  Result Date: 11/05/2019 CLINICAL DATA:  Altered mental status EXAM: PORTABLE CHEST 1 VIEW COMPARISON:  October 07, 2019 FINDINGS: The heart size and mediastinal contours are partially obscured, however there is  mild cardiomegaly. There is prominence of the central pulmonary vasculature. Hazy airspace opacity seen throughout the right lung. The left costophrenic angle is excluded from view. No definite acute osseous abnormality. There is stable appearance to the right distal clavicle. IMPRESSION: Limited due to technique. Pulmonary vascular congestion and hazy airspace opacity throughout the right lung. This could be due to asymmetric edema and/or infectious etiology. Electronically Signed   By: Prudencio Pair M.D.   On: 11/05/2019 23:29   DG ABD ACUTE 2+V W 1V CHEST  Result Date: 10/25/2019 CLINICAL DATA:  Abdominal pain and constipation. EXAM: DG ABDOMEN ACUTE W/ 1V CHEST COMPARISON:  CT 6 days ago 10/18/2019 FINDINGS: Lung evaluation is limited by portable technique and soft tissue attenuation from habitus. Prominent heart size likely accentuated by mediastinal opponent ptosis when correlated with recent abdominal CT lung bases. No significant pleural effusion or confluent airspace disease. No bowel dilatation to suggest obstruction. Air throughout nondilated colon. Small volume of formed stool in the colon. Small volume of stool in the rectum. No visualized radiopaque calculi. There are vascular calcifications. Patient's abdomen leans to the left. Limited osseous structure assessment without evidence of acute osseous abnormality. IMPRESSION: Normal bowel gas pattern. No evidence of obstruction. Small volume of formed stool in the colon. No findings to suggest fecal impaction. Electronically Signed   By: Keith Rake M.D.   On: 10/25/2019 17:02   IR EMBO ART  VEN HEMORR LYMPH EXTRAV  INC GUIDE ROADMAPPING  Result Date: 11/16/2019 INDICATION: Bleeding duodenal ulcer by endoscopy, blood-loss anemia EXAM: ULTRASOUND GUIDANCE FOR VASCULAR ACCESS CELIAC AND GDA CATHETERIZATIONS AND ANGIOGRAMS GDA MICRO COIL EMBOLIZATION MEDICATIONS: 1% lidocaine local. ANESTHESIA/SEDATION: Moderate (conscious) sedation was employed  during this procedure. A total of Versed 1.5 mg and Fentanyl 50 mcg was administered intravenously. Moderate Sedation Time: 40 minutes. The patient's level of consciousness and vital signs were monitored continuously by radiology nursing throughout the procedure under my direct supervision. CONTRAST:  70 cc omni 300 FLUOROSCOPY TIME:  Fluoroscopy Time: 9 minutes 0  seconds (3,066 mGy). COMPLICATIONS: None immediate. PROCEDURE: Informed consent was obtained from the patient following explanation of the procedure, risks, benefits and alternatives. The patient understands, agrees and consents for the procedure. All questions were addressed. A time out was performed prior to the initiation of the procedure. Maximal barrier sterile technique utilized including caps, mask, sterile gowns, sterile gloves, large sterile drape, hand hygiene, and Betadine prep. Under sterile conditions and local anesthesia, ultrasound micropuncture access performed of the right common femoral artery. Images obtained for documentation. Five French sheath inserted over a guidewire. C2 catheter advanced and utilized to select the celiac origin. Celiac angiogram performed. Celiac: The celiac origin is widely patent. The splenic, left gastric, hepatic vasculature, and GDA are all visualized and patent. No active arterial GI bleeding demonstrated. However there are dilated branches within the pancreaticoduodenal arcade which are suspicious for sites of bleeding given the known large duodenal ulcer. Renegade STC microcatheter and a GT single angle Glidewire were utilized to advance the access into the GDA. Selective GDA angiogram performed. GDA: The GDA and gastroepiploic vasculature are all patent. Again, no active arterial bleeding noted but there is 1 dilated tortuous branch from the lower GDA which is suspicious for a bleeding source. GDA micro coil embolization: Microcatheter was advanced to the dilated pancreaticoduodenal branch. 4 and 5 mm  interlock micro coils were deployed for embolization. Following embolization, the GDA is occluded. Repeat celiac angiogram confirms preserved patency of the hepatic vasculature and GDA occlusion. Catheter removed. Right common femoral artery access closed with the ExoSeal device and manual compression. No immediate complication. Patient tolerated the procedure well. IMPRESSION: Dilated branches of the pancreaticoduodenal arcade noted off of the mid to distal GDA. Although no active arterial bleeding demonstrated preventative GDA micro coil embolization performed as detailed above for the known large duodenal ulcer by endoscopy. Electronically Signed   By: Jerilynn Mages.  Shick M.D.   On: 11/16/2019 16:11    Subjective: No complaints.   Discharge Exam: Vitals:   11/20/19 2206 11/21/19 0538  BP: (!) 130/52 (!) 138/53  Pulse: 89 90  Resp: 20 20  Temp: 98.2 F (36.8 C) 98.8 F (37.1 C)  SpO2: 92% 96%   General: Pt is alert, awake, not in acute distress Cardiovascular: RRR, S1/S2 +, no rubs, no gallops Respiratory: CTA bilaterally, no wheezing, no rhonchi Abdominal: Soft, NT, ND, bowel sounds + Extremities: No pitting edema, no cyanosis  Labs: BNP (last 3 results) Recent Labs    05/06/19 1343 08/20/19 1250 11/05/19 2216  BNP 258.1* 511.0* 834.1*   Basic Metabolic Panel: Recent Labs  Lab 11/15/19 0509 11/16/19 0430 11/17/19 0500 11/18/19 0500 11/19/19 0301  NA 144 147* 142 141 145  K 4.0 3.8 3.4* 3.3* 3.6  CL 115* 114* 114* 113* 115*  CO2 21* 22 22 21* 22  GLUCOSE 87 85 100* 142* 102*  BUN 62* 58* 46* 32* 28*  CREATININE 3.49* 3.31* 2.64* 2.21* 2.04*  CALCIUM 8.3* 8.5* 8.0* 7.7* 7.8*   Liver Function Tests: No results for input(s): AST, ALT, ALKPHOS, BILITOT, PROT, ALBUMIN in the last 168 hours. No results for input(s): LIPASE, AMYLASE in the last 168 hours. No results for input(s): AMMONIA in the last 168 hours. CBC: Recent Labs  Lab 11/17/19 0500 11/18/19 0500 11/19/19 0301  11/20/19 0510 11/21/19 0450  WBC 10.3 9.8 9.5 8.0 7.9  HGB 8.7* 8.9* 8.7* 8.8* 9.1*  HCT 26.8* 27.7* 27.3* 28.0* 28.3*  MCV 87.6 89.9 90.7 90.0 89.3  PLT 308 335 355  407* 453*   Cardiac Enzymes: No results for input(s): CKTOTAL, CKMB, CKMBINDEX, TROPONINI in the last 168 hours. BNP: Invalid input(s): POCBNP CBG: Recent Labs  Lab 11/20/19 1119 11/20/19 1658 11/20/19 1803 11/20/19 2208 11/21/19 0738  GLUCAP 82 68* 92 99 71   D-Dimer No results for input(s): DDIMER in the last 72 hours. Hgb A1c No results for input(s): HGBA1C in the last 72 hours. Lipid Profile No results for input(s): CHOL, HDL, LDLCALC, TRIG, CHOLHDL, LDLDIRECT in the last 72 hours. Thyroid function studies No results for input(s): TSH, T4TOTAL, T3FREE, THYROIDAB in the last 72 hours.  Invalid input(s): FREET3 Anemia work up No results for input(s): VITAMINB12, FOLATE, FERRITIN, TIBC, IRON, RETICCTPCT in the last 72 hours. Urinalysis    Component Value Date/Time   COLORURINE AMBER (A) 11/06/2019 0130   APPEARANCEUR CLOUDY (A) 11/06/2019 0130   LABSPEC 1.017 11/06/2019 0130   PHURINE 5.0 11/06/2019 0130   GLUCOSEU NEGATIVE 11/06/2019 0130   HGBUR MODERATE (A) 11/06/2019 0130   BILIRUBINUR NEGATIVE 11/06/2019 0130   KETONESUR NEGATIVE 11/06/2019 0130   PROTEINUR 30 (A) 11/06/2019 0130   UROBILINOGEN 0.2 06/28/2014 1500   NITRITE NEGATIVE 11/06/2019 0130   LEUKOCYTESUR LARGE (A) 11/06/2019 0130    Microbiology Recent Results (from the past 240 hour(s))  MRSA PCR Screening     Status: None   Collection Time: 11/14/19  6:30 PM   Specimen: Nasopharyngeal  Result Value Ref Range Status   MRSA by PCR NEGATIVE NEGATIVE Final    Comment:        The GeneXpert MRSA Assay (FDA approved for NASAL specimens only), is one component of a comprehensive MRSA colonization surveillance program. It is not intended to diagnose MRSA infection nor to guide or monitor treatment for MRSA infections. Performed  at [facility], [place] [address], [place], [place] [address]     Time coordinating discharge: Approximately 40 minutes  Patrecia Pour, MD  [facility] 11/21/2019, 10:14 AM

## 2019-11-21 NOTE — Progress Notes (Signed)
Pt refused CPAP qhs.  Pt encouraged to contact RT should she change her mind.  Machine remains in room.

## 2019-11-22 ENCOUNTER — Inpatient Hospital Stay (HOSPITAL_COMMUNITY): Payer: No Typology Code available for payment source

## 2019-11-22 DIAGNOSIS — N1832 Chronic kidney disease, stage 3b: Secondary | ICD-10-CM

## 2019-11-22 LAB — BASIC METABOLIC PANEL
Anion gap: 11 (ref 5–15)
BUN: 9 mg/dL (ref 8–23)
CO2: 22 mmol/L (ref 22–32)
Calcium: 8.2 mg/dL — ABNORMAL LOW (ref 8.9–10.3)
Chloride: 112 mmol/L — ABNORMAL HIGH (ref 98–111)
Creatinine, Ser: 1.36 mg/dL — ABNORMAL HIGH (ref 0.44–1.00)
GFR calc Af Amer: 47 mL/min — ABNORMAL LOW (ref >60)
GFR calc non Af Amer: 40 mL/min — ABNORMAL LOW (ref >60)
Glucose, Bld: 97 mg/dL (ref 70–99)
Potassium: 3.2 mmol/L — ABNORMAL LOW (ref 3.5–5.1)
Sodium: 145 mmol/L (ref 135–145)

## 2019-11-22 LAB — GLUCOSE, CAPILLARY
Glucose-Capillary: 90 mg/dL (ref 70–99)
Glucose-Capillary: 80 mg/dL (ref 70–99)
Glucose-Capillary: 86 mg/dL (ref 70–99)
Glucose-Capillary: 93 mg/dL (ref 70–99)

## 2019-11-22 LAB — BRAIN NATRIURETIC PEPTIDE: B Natriuretic Peptide: 333.7 pg/mL — ABNORMAL HIGH (ref 0.0–100.0)

## 2019-11-22 MED ORDER — PANTOPRAZOLE SODIUM 40 MG PO TBEC
40.0000 mg | DELAYED_RELEASE_TABLET | Freq: Two times a day (BID) | ORAL | Status: DC
  Administered 2019-11-22 – 2019-11-23 (×3): 40 mg via ORAL
  Filled 2019-11-22 (×3): qty 1

## 2019-11-22 MED ORDER — FUROSEMIDE 10 MG/ML IJ SOLN
20.0000 mg | Freq: Two times a day (BID) | INTRAMUSCULAR | Status: DC
  Administered 2019-11-22 – 2019-11-23 (×2): 20 mg via INTRAVENOUS
  Filled 2019-11-22 (×2): qty 2

## 2019-11-22 NOTE — Progress Notes (Signed)
Physical Therapy Treatment Patient Details Name: [name] [name] MRN: [id number] DOB: 1955-06-08 Today's Date: 11/22/2019    History of Present Illness Patient is 64 y.o. female with PMH significant for HTN, DM, neuropathy, Lt knee fracture, CKD, obesity. Pt admitted from SNF for AMS. Per prior hospital admission  ([facility] home care called and reported unsafe living conditions as caregiver unable to assist pt).  Patient had recent hospital admission on 10/18/2019-10/20/2019 for possible GI bleed.  She was found to have severe constipation and not a GI bleed.  We also found that she had a left knee fracture, unknown when the fracture occurred sometime in August. Pt NWB on Lt LE last admission from 9/1-9/7.    PT Comments    Pt is grossly uncooperative with PT. She had multiple reasons she could not work with PT, we saw to those requests and pt continued to refuse, having new reasons why she wouldn't  do anything "right now".   Pt  has diminished insight into her own deficits, stated she was going to go home, discussed whether or not anyone would be there to take care of pt, she stated " I don't know who is there".  Discussed how pt would meet her basic needs such as getting food and toileting, pt remains greatly argumentative and continues to state she would not have any trouble at home.   Attempted to discuss the importance of mobilizing, pt continued to refuse any and all mobility. She moves only her L UE during attempted PT session. D/t repeated refusals and pt being unwilling to attempt any mobility, PT will sign off at this time .      Follow Up Recommendations  SNF;Supervision/Assistance - 24 hour     Equipment Recommendations  None recommended by PT    Recommendations for Other Services       Precautions / Restrictions Precautions Precautions: Fall Restrictions Weight Bearing Restrictions: Yes LLE Weight Bearing: Non weight bearing Other Position/Activity Restrictions: Per Dr.  Reather Littler 9/5 note: NWB pillow splints. Bed to chair stabilizing left knee. Need To rexray in three weeks. Most likely patient will be wheel chair bound even with healing.    Mobility  Bed Mobility Overal bed mobility: Needs Assistance             General bed mobility comments: refuses any attempt to mobilize   Transfers                    Ambulation/Gait                 Stairs             Wheelchair Mobility    Modified Rankin (Stroke Patients Only)       Balance                                            Cognition Arousal/Alertness: Awake/alert Behavior During Therapy: WFL for tasks assessed/performed (easily agitated) Overall Cognitive Status: Impaired/Different from baseline Area of Impairment: Following commands;Problem solving;Safety/judgement;Awareness                       Following Commands: Follows one step commands inconsistently;Follows multi-step commands inconsistently (declines to adhere to most requests) Safety/Judgement: Decreased awareness of safety;Decreased awareness of deficits Awareness: Intellectual   General Comments: Pt is easily agitated, she refuses to move in  the bed, roll, etc. attempted to explain that we were present to help pt, explained importance of mobility,  pt continued to adamantly refuse to cooperate. Appears to not have any insight into her deficits. Patient's conversation tangential and had to be cued to stay on task.      Exercises      General Comments        Pertinent Vitals/Pain Pain Location: pre-medicated at pt request, pt does not specify any pain/location etc    Home Living                      Prior Function            PT Goals (current goals can now be found in the care plan section) Acute Rehab PT Goals Patient Stated Goal: to stay in bed and be left alone PT Goal Formulation: With patient Time For Goal Achievement: 11/22/19 Potential to Achieve  Goals: Fair Progress towards PT goals: Progressing toward goals    Frequency           PT Plan Current plan remains appropriate    Co-evaluation              AM-PAC PT "6 Clicks" Mobility   Outcome Measure  Help needed turning from your back to your side while in a flat bed without using bedrails?: Total Help needed moving from lying on your back to sitting on the side of a flat bed without using bedrails?: Total Help needed moving to and from a bed to a chair (including a wheelchair)?: Total Help needed standing up from a chair using your arms (e.g., wheelchair or bedside chair)?: Total Help needed to walk in hospital room?: Total Help needed climbing 3-5 steps with a railing? : Total 6 Click Score: 6    End of Session   Activity Tolerance: Other (comment) (self limiting, refuses to mobilize) Patient left: in bed;with call bell/phone within reach;with bed alarm set   PT Visit Diagnosis: Muscle weakness (generalized) (M62.81);Other abnormalities of gait and mobility (R26.89);Difficulty in walking, not elsewhere classified (R26.2)     Time: 8088-1103 PT Time Calculation (min) (ACUTE ONLY): 12 min  Charges:  $Self Care/Home Management: 8-22                     Baxter Flattery, PT  Acute Rehab Dept [facility]) [phone] Pager [phone]  11/22/2019    [facility] 11/22/2019, 1:23 PM

## 2019-11-22 NOTE — TOC Progression Note (Signed)
Transition of Care [facility]) - Progression Note    Patient Details  Name: [name] [name] MRN: [id number] Date of Birth: 1956/02/16  Transition of Care [facility]) CM/SW Contact  [name], RN Phone Number: 11/22/2019, 1:15 PM  Clinical Narrative:    SNF called to decline pt related to her wt of 415 lbs.    Expected Discharge Plan: [place] Barriers to Discharge: Continued Medical Work up, Ship broker  Expected Discharge Plan and Services Expected Discharge Plan: [place] Choice: [place] Living arrangements for the past 2 months: [place], [place] Expected Discharge Date: 11/21/19                 DME Agency: NA       HH Arranged: NA           Social Determinants of Health (SDOH) Interventions    Readmission Risk Interventions Readmission Risk Prevention Plan 11/08/2019  Transportation Screening Complete  Medication Review Press photographer) Referral to Pharmacy  PCP or Specialist appointment within 3-5 days of discharge Complete  HRI or [place] Complete  SW Recovery Care/Counseling Consult Complete  Palliative Care Screening Not [place] Complete  Some recent data might be hidden

## 2019-11-22 NOTE — Plan of Care (Signed)
  Problem: Health Behavior/Discharge Planning: Goal: Ability to manage health-related needs will improve Outcome: Progressing   Problem: Clinical Measurements: Goal: Ability to maintain clinical measurements within normal limits will improve Outcome: Progressing Goal: Will remain free from infection Outcome: Progressing Goal: Diagnostic test results will improve Outcome: Progressing   Problem: Activity: Goal: Risk for activity intolerance will decrease Outcome: Progressing   Problem: Nutrition: Goal: Adequate nutrition will be maintained Outcome: Progressing   Problem: Coping: Goal: Level of anxiety will decrease Outcome: Progressing   Problem: Elimination: Goal: Will not experience complications related to bowel motility Outcome: Progressing   Problem: Pain Managment: Goal: General experience of comfort will improve Outcome: Progressing   Problem: Safety: Goal: Ability to remain free from injury will improve Outcome: Progressing   Problem: Skin Integrity: Goal: Risk for impaired skin integrity will decrease Outcome: Progressing   

## 2019-11-22 NOTE — Progress Notes (Signed)
OT Cancellation Note  Patient Details Name: [name] [name] MRN: [id number] DOB: 1956/01/29   Cancelled Treatment:    Reason Eval/Treat Not Completed: Other (comment) Spoke with PT that attempted to see patient earlier today reports patient refusing mobility. Therapies have made multiple attempts to mobilize patient with max encouragement. Patient with limited insights into deficits stating she will walk in the hall, then when attempt to mobilize refuses. Due to limited progress/participation with therapies acute OT will sign off at this time. Please re-consult if patient is agreeable to participate.   Delbert Phenix OT OT pager: [phone]   Rosemary Holms 11/22/2019, 2:43 PM

## 2019-11-22 NOTE — Progress Notes (Signed)
The facility that pt supposed to go today called at the desk left a message that they have a bariatric bed available for the pt and they could accept the pt tom. Will pass the message to the nightshift for the dayshift RN to notify the SW tom.

## 2019-11-22 NOTE — Discharge Summary (Signed)
Physician Discharge Summary  KANSAS SPAINHOWER NGE:[id number] DOB: 02/12/1956 DOA: 11/05/2019  PCP: [name], MD  Admit date: 11/05/2019 Discharge date: 11/22/2019  Admitted From: SNF Disposition: SNF   Recommendations for Outpatient Follow-up:  1. Follow up with PCP at [place] facility 2. Follow up with GI for surveillance of duodenal ulcer. 3. Follow up with orthopedics for continued management (nonoperative) or left femoral fracture.   Home Health: N/A Equipment/Devices: None new Discharge Condition: Stable CODE STATUS: Full Diet recommendation: Heart healthy, carb-modified  Brief/Interim Summary: [name] [name] is a 64 year old female withsupermorbid obesity, hypertension, type 2 diabetes, stage IV chronic kidney disease with baseline creatinine about 1.7, recent left leg injury and fracture recovering at the skilled nursing facility brought to the ER on 9/14 for confusion. Patient was reportedly unresponsive to verbal stimuli and subsequently was incoherent. In the emergency room, she improved spontaneously and responded to questions appropriately. Patient is on multiple medications including narcotics and benzodiazepine. Admitted with mild acute metabolic encephalopathy and acute renal failure. Patient continued to have diarrhea 11/15/2019 on the evening of 11/10/2019 patient started having bloody stools with frank bleeding per rectum with drop in hemoglobin she received 6 unit of packed RBC at that time she had colonoscopy done on 11/14/2019 showed diverticuli in the ascending colon otherwise with normal exam however it was not an optimal study. Patient continues with slow drifting down of hemoglobin received on the unit of packed RBC on 11/14/2019 so far she has received a total of 8 units of packed RBC.  On 11/16/2019 patient had GDA embolization by interventional radiology for ongoing GI bleed. Hemoglobin has stabilized since that time. GI has  signed off. Plan is for return to SNF for rehabilitation.   Discharge Diagnoses:  Principal Problem:   Acute renal failure ([place]) Active Problems:   Type 2 diabetes mellitus with stage 4 chronic kidney disease (HCC)   Metabolic encephalopathy   CKD (chronic kidney disease) stage 4, GFR 15-29 ml/min (HCC)   Pressure injury of skin   Morbid obesity (HCC)   GI bleeding   Encephalopathy acute   Vaginal discharge   Anemia associated with acute blood loss  AKI with CKD stage IV: Improving with resolution of hypotension, withholding thiazide diuretic.  - Overall CrCl is at/better than previous baseline now.  - Avoid nephrotoxins.  - Maintain euvolemia, avoid hypotension.  Acute on chronic symptomatic blood loss anemia secondary to upper GI bleed from duodenal ulcer s/p GDA 9/25: s/p 8u PRBCs total. - Continue monitoring CBC. Hgb stable for several days prior to discharge.  - Continue PPI BID - F/u with GI as outpatient.   Acute metabolic encephalopathy: Resolved. Thought to be likely related to polypharmacy.  - Decrease gabapentin to 600mg  po BID from 1,800mg  qAM.  - Change oxycodone 10mg  to hydrocodone 5mg  dosing prn (has tolerated here) - Decrease benzodiazepine dosing  HTN: BP rising while holding thiazide, will restart at DC.  T2DM with hypoglycemic episodes on SSI. - Return to po medications at discharge.   Anxiety: Patient continued on as needed Xanax.  She had some episodes of vomiting the night before because she was feeling very anxious.  Abdominal x-ray unremarkable.  Patient feeling better after she had taken some Xanax.  HLD:  - Continue statin  UTI:  - S/p CTX x3 days  Hypokalemia: Supplemented  Hypocalcemia:  - Supplemented.   Left femoral fracture with partially healed malunion of her femoral condyle: Operative risk precludes surgery.  -  LLE Weight Bearing: Non weight bearing Other Position/Activity Restrictions: Per Dr. Reather Littler 9/5 note: NWB pillow  splints. Bed to chair stabilizing left knee. Need To rexray in three weeks. Most likely patient will be wheel chair bound even with healing.    Obesity: Estimated body mass index is 54.69 kg/m  EGD RESULTS 11/15/2019 Dr. Benson Norway:  Impression:               - Normal esophagus.                           - Red blood in the gastric body, in the gastric                            antrum and in the pylorus.                           - Adherent clot duodenal ulcer with an adherent                            clot (Forrest Class IIb). Injected. hemostatic                            spray applied.                           - No specimens collected. Moderate Sedation:      Not Applicable - Patient had care per Anesthesia. Recommendation:           - Return patient to hospital ward for ongoing care.                           - NPO.                           - Continue present medications.                           - Follow HGB and transfuse as necessary.                           - ? Relook EGD in 2 days.  Discharge Instructions  Allergies as of 11/22/2019      Reactions   Morphine Itching, Rash   Penicillins Itching, Nausea And Vomiting, Rash, Hives   Has patient had a PCN reaction causing immediate rash, facial/tongue/throat swelling, SOB or lightheadedness with hypotension: No Has patient had a PCN reaction causing severe rash involving mucus membranes or skin necrosis: No Has patient had a PCN reaction that required hospitalization: No Has patient had a PCN reaction occurring within the last 10 years: No If all of the above answers are "NO", then may proceed with Cephalosporin use. Has patient had a PCN reaction causing immediate rash, facial/tongue/throat swelling, SOB or lightheadedness with hypotension: No Has patient had a PCN reaction causing severe rash involving mucus membranes or skin necrosis: No Has patient had a PCN reaction that required hospitalization: No Has patient had a PCN  reaction occurring within the last 10 years: No If all of the above answers are "NO", then may proceed with Cephalosporin  use.      Medication List    STOP taking these medications   oxyCODONE-acetaminophen 10-325 MG tablet Commonly known as: PERCOCET     TAKE these medications   allopurinol 100 MG tablet Commonly known as: ZYLOPRIM Take 100 mg by mouth 2 (two) times daily.   ALPRAZolam 0.5 MG tablet Commonly known as: XANAX Take 1 tablet (0.5 mg total) by mouth 3 (three) times daily as needed for anxiety. What changed:   medication strength  how much to take   atenolol-chlorthalidone 50-25 MG tablet Commonly known as: TENORETIC Take 1 tablet by mouth daily.   atorvastatin 10 MG tablet Commonly known as: LIPITOR Take 10 mg by mouth daily.   DULoxetine 30 MG capsule Commonly known as: CYMBALTA Take 30 mg by mouth 2 (two) times daily.   gabapentin 600 MG tablet Commonly known as: NEURONTIN Take 1 tablet (600 mg total) by mouth 2 (two) times daily. What changed:   how much to take  when to take this   HYDROcodone-acetaminophen 5-325 MG tablet Commonly known as: NORCO/VICODIN Take 1 tablet by mouth every 8 (eight) hours as needed for severe pain.   hydrOXYzine 50 MG tablet Commonly known as: ATARAX/VISTARIL Take 50 mg by mouth every 8 (eight) hours as needed for anxiety or itching.   pantoprazole 40 MG tablet Commonly known as: Protonix Take 1 tablet (40 mg total) by mouth 2 (two) times daily.   pioglitazone 30 MG tablet Commonly known as: ACTOS Take 30 mg by mouth daily.   senna 8.6 MG Tabs tablet Commonly known as: SENOKOT Take 1 tablet (8.6 mg total) by mouth daily.   sitaGLIPtin 100 MG tablet Commonly known as: JANUVIA Take 100 mg by mouth daily.   sorbitol 70 % Soln Take 30 mLs by mouth 2 (two) times daily. As a second choice for constipation if you do not stool with senna       Contact information for follow-up providers    [name], MD Follow up.   Specialty: Family Medicine Contact information: [place] [address] [phone]        Susa Day, MD. Schedule an appointment as soon as possible for a visit in 1 week(s).   Specialty: Orthopedic Surgery Contact information: [address] [place] [address] [phone]        Carol Ada, MD. Schedule an appointment as soon as possible for a visit in 1 month(s).   Specialty: Gastroenterology Contact information: [address] [place] [address] [phone]            Contact information for after-discharge care    Destination    [facility] .   Service: Skilled Nursing Contact information: [address] [address] [phone]                 Allergies  Allergen Reactions  . Morphine Itching and Rash  . Penicillins Itching, Nausea And Vomiting, Rash and Hives    Has patient had a PCN reaction causing immediate rash, facial/tongue/throat swelling, SOB or lightheadedness with hypotension: No Has patient had a PCN reaction causing severe rash involving mucus membranes or skin necrosis: No Has patient had a PCN reaction that required hospitalization: No Has patient had a PCN reaction occurring within the last 10 years: No If all of the above answers are "NO", then may proceed with Cephalosporin use.  Has patient had a PCN reaction causing immediate  rash, facial/tongue/throat swelling, SOB or lightheadedness with hypotension: No Has patient had a PCN reaction causing severe rash involving mucus membranes or skin necrosis: No Has patient had a PCN reaction that required hospitalization: No Has patient had a PCN reaction occurring within the last 10 years: No If all of the above answers are "NO", then may proceed with Cephalosporin use.     Consultations:  Nephrology  GI  IR  Procedures/Studies: CT ABDOMEN  PELVIS WO CONTRAST  Result Date: 11/06/2019 CLINICAL DATA:  Abdominal pain.  Vaginal discharge EXAM: CT ABDOMEN AND PELVIS WITHOUT CONTRAST TECHNIQUE: Multidetector CT imaging of the abdomen and pelvis was performed following the standard protocol without oral or IV contrast. COMPARISON:  October 18, 2019 FINDINGS: Lower chest: There is bibasilar atelectasis. Hepatobiliary: No focal liver lesions are appreciable on this noncontrast enhanced study. Gallbladder wall is not appreciably thickened. There is no biliary duct dilatation. Pancreas: There is no pancreatic mass or inflammatory focus. Spleen: No splenic lesions are evident. Adrenals/Urinary Tract: Adrenals bilaterally appear normal. There is no evident renal mass on either side. Calcification along the posterior cortex of the left upper to mid kidney is a stable finding without associated mass effect. There is no evident hydronephrosis on either side. There is no appreciable renal or ureteral calculus on either side. Urinary bladder is midline with wall thickness within normal limits. Note that a Foley catheter is present within the bladder. Stomach/Bowel: There is moderate stool throughout the colon. There is no appreciable bowel wall or mesenteric thickening. There is no demonstrable bowel obstruction. There is no appreciable free air or portal venous air. The terminal ileum appears normal. Vascular/Lymphatic: There is no abdominal aortic aneurysm. There is aortic and iliac artery atherosclerotic calcification. No adenopathy is appreciable in the abdomen or pelvis. Reproductive: Uterus is absent.  No evident pelvic mass. Other: No periappendiceal region inflammatory change. No abscess or ascites in the abdomen or pelvis. There is apparent scarring throughout the left lateral abdominal wall. There is no fluid in this area. Musculoskeletal: Arthropathy in the lumbar spine is again noted. There is severe disc space narrowing at L4-5 with 5 mm of anterolisthesis  of L5 on S1, stable. There are pars defects at L4 and L5 bilaterally. No blastic or lytic bone lesions. No intramuscular lesions are demonstrable. IMPRESSION: 1. No bowel wall thickening or bowel obstruction is currently appreciable. No abscess in the abdomen pelvis. No periappendiceal region inflammation evident. 2. No renal or ureteral calculus. No hydronephrosis. Calcification along the left kidney posteriorly likely represents previous trauma or inflammation, stable. Foley catheter within urinary bladder. 3.  Scarring lateral left abdomen, grossly stable. 4. Extensive lower lumbar arthropathy. Spondylolisthesis at L4-5. 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 5.  Aortic Atherosclerosis (ICD10-I70.0). 6.  Uterus absent. Electronically Signed   By: Lowella Grip III M.D.   On: 11/06/2019 08:44   DG Knee 2 Views Left  Result Date: 10/25/2019 CLINICAL DATA:  Follow-up femur fracture EXAM: LEFT KNEE - 1-2 VIEW COMPARISON:  08/16/2019 FINDINGS: Comminuted intra-articular distal femoral fracture involves the lateral femoral condyle. The fracture appears impacted. Some periosteal new bone formation presumably due to subacute process. Vascular calcification. Advanced medial and patellofemoral degenerative change. No large knee effusion. IMPRESSION: Comminuted, impacted intra-articular fracture involving the lateral femoral condyle and intercondylar region. Some periosteal new bone formation, suggesting subacute process. Electronically Signed   By: Donavan Foil M.D.   On: 10/25/2019 19:45   CT Head Wo Contrast  Result Date: 11/05/2019  CLINICAL DATA:  Mental status change EXAM: CT HEAD WITHOUT CONTRAST TECHNIQUE: Contiguous axial images were obtained from the base of the skull through the vertex without intravenous contrast. COMPARISON:  October 27, 2013 FINDINGS: Brain: No evidence of acute territorial infarction, hemorrhage, hydrocephalus,extra-axial collection or mass lesion/mass effect. Normal gray-white  differentiation. Ventricles are normal in size and contour. Vascular: No hyperdense vessel or unexpected calcification. Skull: The skull is intact. No fracture or focal lesion identified. Sinuses/Orbits: The visualized paranasal sinuses and mastoid air cells are clear. The orbits and globes intact. Other: None IMPRESSION: No acute intracranial abnormality. Electronically Signed   By: Prudencio Pair M.D.   On: 11/05/2019 18:48   IR Angiogram Visceral Selective  Result Date: 11/16/2019 INDICATION: Bleeding duodenal ulcer by endoscopy, blood-loss anemia EXAM: ULTRASOUND GUIDANCE FOR VASCULAR ACCESS CELIAC AND GDA CATHETERIZATIONS AND ANGIOGRAMS GDA MICRO COIL EMBOLIZATION MEDICATIONS: 1% lidocaine local. ANESTHESIA/SEDATION: Moderate (conscious) sedation was employed during this procedure. A total of Versed 1.5 mg and Fentanyl 50 mcg was administered intravenously. Moderate Sedation Time: 40 minutes. The patient's level of consciousness and vital signs were monitored continuously by radiology nursing throughout the procedure under my direct supervision. CONTRAST:  70 cc omni 300 FLUOROSCOPY TIME:  Fluoroscopy Time: 9 minutes 0 seconds (3,066 mGy). COMPLICATIONS: None immediate. PROCEDURE: Informed consent was obtained from the patient following explanation of the procedure, risks, benefits and alternatives. The patient understands, agrees and consents for the procedure. All questions were addressed. A time out was performed prior to the initiation of the procedure. Maximal barrier sterile technique utilized including caps, mask, sterile gowns, sterile gloves, large sterile drape, hand hygiene, and Betadine prep. Under sterile conditions and local anesthesia, ultrasound micropuncture access performed of the right common femoral artery. Images obtained for documentation. Five French sheath inserted over a guidewire. C2 catheter advanced and utilized to select the celiac origin. Celiac angiogram performed. Celiac: The  celiac origin is widely patent. The splenic, left gastric, hepatic vasculature, and GDA are all visualized and patent. No active arterial GI bleeding demonstrated. However there are dilated branches within the pancreaticoduodenal arcade which are suspicious for sites of bleeding given the known large duodenal ulcer. Renegade STC microcatheter and a GT single angle Glidewire were utilized to advance the access into the GDA. Selective GDA angiogram performed. GDA: The GDA and gastroepiploic vasculature are all patent. Again, no active arterial bleeding noted but there is 1 dilated tortuous branch from the lower GDA which is suspicious for a bleeding source. GDA micro coil embolization: Microcatheter was advanced to the dilated pancreaticoduodenal branch. 4 and 5 mm interlock micro coils were deployed for embolization. Following embolization, the GDA is occluded. Repeat celiac angiogram confirms preserved patency of the hepatic vasculature and GDA occlusion. Catheter removed. Right common femoral artery access closed with the ExoSeal device and manual compression. No immediate complication. Patient tolerated the procedure well. IMPRESSION: Dilated branches of the pancreaticoduodenal arcade noted off of the mid to distal GDA. Although no active arterial bleeding demonstrated preventative GDA micro coil embolization performed as detailed above for the known large duodenal ulcer by endoscopy. Electronically Signed   By: Jerilynn Mages.  Shick M.D.   On: 11/16/2019 16:11   IR Angiogram Selective Each Additional Vessel  Result Date: 11/16/2019 INDICATION: Bleeding duodenal ulcer by endoscopy, blood-loss anemia EXAM: ULTRASOUND GUIDANCE FOR VASCULAR ACCESS CELIAC AND GDA CATHETERIZATIONS AND ANGIOGRAMS GDA MICRO COIL EMBOLIZATION MEDICATIONS: 1% lidocaine local. ANESTHESIA/SEDATION: Moderate (conscious) sedation was employed during this procedure. A total of Versed 1.5 mg and  Fentanyl 50 mcg was administered intravenously. Moderate  Sedation Time: 40 minutes. The patient's level of consciousness and vital signs were monitored continuously by radiology nursing throughout the procedure under my direct supervision. CONTRAST:  70 cc omni 300 FLUOROSCOPY TIME:  Fluoroscopy Time: 9 minutes 0 seconds (3,066 mGy). COMPLICATIONS: None immediate. PROCEDURE: Informed consent was obtained from the patient following explanation of the procedure, risks, benefits and alternatives. The patient understands, agrees and consents for the procedure. All questions were addressed. A time out was performed prior to the initiation of the procedure. Maximal barrier sterile technique utilized including caps, mask, sterile gowns, sterile gloves, large sterile drape, hand hygiene, and Betadine prep. Under sterile conditions and local anesthesia, ultrasound micropuncture access performed of the right common femoral artery. Images obtained for documentation. Five French sheath inserted over a guidewire. C2 catheter advanced and utilized to select the celiac origin. Celiac angiogram performed. Celiac: The celiac origin is widely patent. The splenic, left gastric, hepatic vasculature, and GDA are all visualized and patent. No active arterial GI bleeding demonstrated. However there are dilated branches within the pancreaticoduodenal arcade which are suspicious for sites of bleeding given the known large duodenal ulcer. Renegade STC microcatheter and a GT single angle Glidewire were utilized to advance the access into the GDA. Selective GDA angiogram performed. GDA: The GDA and gastroepiploic vasculature are all patent. Again, no active arterial bleeding noted but there is 1 dilated tortuous branch from the lower GDA which is suspicious for a bleeding source. GDA micro coil embolization: Microcatheter was advanced to the dilated pancreaticoduodenal branch. 4 and 5 mm interlock micro coils were deployed for embolization. Following embolization, the GDA is occluded. Repeat celiac  angiogram confirms preserved patency of the hepatic vasculature and GDA occlusion. Catheter removed. Right common femoral artery access closed with the ExoSeal device and manual compression. No immediate complication. Patient tolerated the procedure well. IMPRESSION: Dilated branches of the pancreaticoduodenal arcade noted off of the mid to distal GDA. Although no active arterial bleeding demonstrated preventative GDA micro coil embolization performed as detailed above for the known large duodenal ulcer by endoscopy. Electronically Signed   By: Jerilynn Mages.  Shick M.D.   On: 11/16/2019 16:11   IR Fluoro Guide CV Line Right  Result Date: 11/06/2019 INDICATION: 64 year old female with acute kidney injury in need of durable venous access. She is morbidly obese and presents for placement of a tunneled central venous catheter. EXAM: IR ULTRASOUND GUIDANCE VASC ACCESS RIGHT; IR RIGHT FLUORO GUIDE CV LINE MEDICATIONS: None ANESTHESIA/SEDATION: None FLUOROSCOPY TIME:  Fluoroscopy Time: 0 minutes 24 seconds (11 mGy). COMPLICATIONS: None immediate. PROCEDURE: Informed written consent was obtained from the patient after a thorough discussion of the procedural risks, benefits and alternatives. All questions were addressed. Maximal Sterile Barrier Technique was utilized including caps, mask, sterile gowns, sterile gloves, sterile drape, hand hygiene and skin antiseptic. A timeout was performed prior to the initiation of the procedure. The right internal jugular vein was interrogated with ultrasound and found to be widely patent. An image was obtained and stored for the medical record. Local anesthesia was attained by infiltration with 1% lidocaine. A small dermatotomy was made. Under real-time sonographic guidance, the vessel was punctured with a 21 gauge micropuncture needle. Using standard technique, the initial micro needle was exchanged over a 0.018 micro wire for a peel-away sheath. A suitable skin exit site was selected. Local  anesthesia was again attained by infiltration with 1% lidocaine. A small dermatotomy was made. A dual lumen power injectable  central venous catheter was then tunneled from the dermatotomy at the skin exit site to the dermatotomy overlying the venous access site. The catheter was then cut to length and advanced through the peel-away sheath and position with the tip in the upper right atrium. The catheter was secured to the skin with 0 Prolene suture. The catheter flushes and aspirates easily. The catheter was flushed and capped. The dermatotomy at the venous access site was sealed with Dermabond. Sterile bandages were applied. IMPRESSION: Placement of a right IJ approach tunneled central venous catheter. The catheter tips are in the upper right atrium and the catheter is ready for immediate use. Electronically Signed   By: Jacqulynn Cadet M.D.   On: 11/06/2019 16:51   IR Removal Tun Cv Cath W/O FL  Result Date: 11/21/2019 INDICATION: Patient with history of chronic kidney disease, anemia secondary to upper GI bleed from duodenal ulcer, poor venous access with previously placed tunneled right internal jugular central venous catheter on 11/06/2019; patient is awaiting discharge to nursing facility and request now received for tunneled central venous catheter removal EXAM: REMOVAL TUNNELED CENTRAL VENOUS CATHETER MEDICATIONS: None ANESTHESIA/SEDATION: None FLUOROSCOPY TIME:  None COMPLICATIONS: None immediate. PROCEDURE: Informed consent was obtained from the patient after a thorough discussion of the procedural risks, benefits and alternatives. All questions were addressed. Maximal Sterile Barrier Technique was utilized including sterile gloves, sterile drape, hand hygiene and skin antiseptic. A timeout was performed prior to the initiation of the procedure. The patient's right chest and catheter was prepped and draped in a normal sterile fashion. Sutures were cut. Using gentle manual traction the central venous  catheter was removed in it's entirety. Pressure was held till hemostasis was obtained. A sterile dressing was applied. The patient tolerated the procedure well with no immediate complications. IMPRESSION: Successful catheter removal as described above. Read by: Rowe Robert, PA-C Electronically Signed   By: Ruthann Cancer MD   On: 11/21/2019 10:59   IR US Guide Vasc Access Right  Result Date: 11/16/2019 INDICATION: Bleeding duodenal ulcer by endoscopy, blood-loss anemia EXAM: ULTRASOUND GUIDANCE FOR VASCULAR ACCESS CELIAC AND GDA CATHETERIZATIONS AND ANGIOGRAMS GDA MICRO COIL EMBOLIZATION MEDICATIONS: 1% lidocaine local. ANESTHESIA/SEDATION: Moderate (conscious) sedation was employed during this procedure. A total of Versed 1.5 mg and Fentanyl 50 mcg was administered intravenously. Moderate Sedation Time: 40 minutes. The patient's level of consciousness and vital signs were monitored continuously by radiology nursing throughout the procedure under my direct supervision. CONTRAST:  70 cc omni 300 FLUOROSCOPY TIME:  Fluoroscopy Time: 9 minutes 0 seconds (3,066 mGy). COMPLICATIONS: None immediate. PROCEDURE: Informed consent was obtained from the patient following explanation of the procedure, risks, benefits and alternatives. The patient understands, agrees and consents for the procedure. All questions were addressed. A time out was performed prior to the initiation of the procedure. Maximal barrier sterile technique utilized including caps, mask, sterile gowns, sterile gloves, large sterile drape, hand hygiene, and Betadine prep. Under sterile conditions and local anesthesia, ultrasound micropuncture access performed of the right common femoral artery. Images obtained for documentation. Five French sheath inserted over a guidewire. C2 catheter advanced and utilized to select the celiac origin. Celiac angiogram performed. Celiac: The celiac origin is widely patent. The splenic, left gastric, hepatic vasculature,  and GDA are all visualized and patent. No active arterial GI bleeding demonstrated. However there are dilated branches within the pancreaticoduodenal arcade which are suspicious for sites of bleeding given the known large duodenal ulcer. Renegade STC microcatheter and a GT single  angle Glidewire were utilized to advance the access into the GDA. Selective GDA angiogram performed. GDA: The GDA and gastroepiploic vasculature are all patent. Again, no active arterial bleeding noted but there is 1 dilated tortuous branch from the lower GDA which is suspicious for a bleeding source. GDA micro coil embolization: Microcatheter was advanced to the dilated pancreaticoduodenal branch. 4 and 5 mm interlock micro coils were deployed for embolization. Following embolization, the GDA is occluded. Repeat celiac angiogram confirms preserved patency of the hepatic vasculature and GDA occlusion. Catheter removed. Right common femoral artery access closed with the ExoSeal device and manual compression. No immediate complication. Patient tolerated the procedure well. IMPRESSION: Dilated branches of the pancreaticoduodenal arcade noted off of the mid to distal GDA. Although no active arterial bleeding demonstrated preventative GDA micro coil embolization performed as detailed above for the known large duodenal ulcer by endoscopy. Electronically Signed   By: Jerilynn Mages.  Shick M.D.   On: 11/16/2019 16:11   IR US Guide Vasc Access Right  Result Date: 11/06/2019 INDICATION: 64 year old female with acute kidney injury in need of durable venous access. She is morbidly obese and presents for placement of a tunneled central venous catheter. EXAM: IR ULTRASOUND GUIDANCE VASC ACCESS RIGHT; IR RIGHT FLUORO GUIDE CV LINE MEDICATIONS: None ANESTHESIA/SEDATION: None FLUOROSCOPY TIME:  Fluoroscopy Time: 0 minutes 24 seconds (11 mGy). COMPLICATIONS: None immediate. PROCEDURE: Informed written consent was obtained from the patient after a thorough discussion  of the procedural risks, benefits and alternatives. All questions were addressed. Maximal Sterile Barrier Technique was utilized including caps, mask, sterile gowns, sterile gloves, sterile drape, hand hygiene and skin antiseptic. A timeout was performed prior to the initiation of the procedure. The right internal jugular vein was interrogated with ultrasound and found to be widely patent. An image was obtained and stored for the medical record. Local anesthesia was attained by infiltration with 1% lidocaine. A small dermatotomy was made. Under real-time sonographic guidance, the vessel was punctured with a 21 gauge micropuncture needle. Using standard technique, the initial micro needle was exchanged over a 0.018 micro wire for a peel-away sheath. A suitable skin exit site was selected. Local anesthesia was again attained by infiltration with 1% lidocaine. A small dermatotomy was made. A dual lumen power injectable central venous catheter was then tunneled from the dermatotomy at the skin exit site to the dermatotomy overlying the venous access site. The catheter was then cut to length and advanced through the peel-away sheath and position with the tip in the upper right atrium. The catheter was secured to the skin with 0 Prolene suture. The catheter flushes and aspirates easily. The catheter was flushed and capped. The dermatotomy at the venous access site was sealed with Dermabond. Sterile bandages were applied. IMPRESSION: Placement of a right IJ approach tunneled central venous catheter. The catheter tips are in the upper right atrium and the catheter is ready for immediate use. Electronically Signed   By: Jacqulynn Cadet M.D.   On: 11/06/2019 16:51   DG Chest Portable 1 View  Result Date: 11/05/2019 CLINICAL DATA:  Altered mental status EXAM: PORTABLE CHEST 1 VIEW COMPARISON:  October 07, 2019 FINDINGS: The heart size and mediastinal contours are partially obscured, however there is mild cardiomegaly.  There is prominence of the central pulmonary vasculature. Hazy airspace opacity seen throughout the right lung. The left costophrenic angle is excluded from view. No definite acute osseous abnormality. There is stable appearance to the right distal clavicle. IMPRESSION: Limited due to technique.  Pulmonary vascular congestion and hazy airspace opacity throughout the right lung. This could be due to asymmetric edema and/or infectious etiology. Electronically Signed   By: Prudencio Pair M.D.   On: 11/05/2019 23:29   DG ABD ACUTE 2+V W 1V CHEST  Result Date: 10/25/2019 CLINICAL DATA:  Abdominal pain and constipation. EXAM: DG ABDOMEN ACUTE W/ 1V CHEST COMPARISON:  CT 6 days ago 10/18/2019 FINDINGS: Lung evaluation is limited by portable technique and soft tissue attenuation from habitus. Prominent heart size likely accentuated by mediastinal opponent ptosis when correlated with recent abdominal CT lung bases. No significant pleural effusion or confluent airspace disease. No bowel dilatation to suggest obstruction. Air throughout nondilated colon. Small volume of formed stool in the colon. Small volume of stool in the rectum. No visualized radiopaque calculi. There are vascular calcifications. Patient's abdomen leans to the left. Limited osseous structure assessment without evidence of acute osseous abnormality. IMPRESSION: Normal bowel gas pattern. No evidence of obstruction. Small volume of formed stool in the colon. No findings to suggest fecal impaction. Electronically Signed   By: Keith Rake M.D.   On: 10/25/2019 17:02   DG Abd Portable 1V  Result Date: 11/22/2019 CLINICAL DATA:  Nausea and vomiting EXAM: PORTABLE ABDOMEN - 1 VIEW COMPARISON:  10/25/2019 FINDINGS: Limited study due to morbid obesity. Normal bowel gas pattern. No dilated bowel loops. Embolization coils are present in the epigastric region. IMPRESSION: Normal bowel gas pattern.  Limited study. Electronically Signed   By: Franchot Gallo M.D.    On: 11/22/2019 09:50   IR EMBO ART  VEN HEMORR LYMPH EXTRAV  INC GUIDE ROADMAPPING  Result Date: 11/16/2019 INDICATION: Bleeding duodenal ulcer by endoscopy, blood-loss anemia EXAM: ULTRASOUND GUIDANCE FOR VASCULAR ACCESS CELIAC AND GDA CATHETERIZATIONS AND ANGIOGRAMS GDA MICRO COIL EMBOLIZATION MEDICATIONS: 1% lidocaine local. ANESTHESIA/SEDATION: Moderate (conscious) sedation was employed during this procedure. A total of Versed 1.5 mg and Fentanyl 50 mcg was administered intravenously. Moderate Sedation Time: 40 minutes. The patient's level of consciousness and vital signs were monitored continuously by radiology nursing throughout the procedure under my direct supervision. CONTRAST:  70 cc omni 300 FLUOROSCOPY TIME:  Fluoroscopy Time: 9 minutes 0 seconds (3,066 mGy). COMPLICATIONS: None immediate. PROCEDURE: Informed consent was obtained from the patient following explanation of the procedure, risks, benefits and alternatives. The patient understands, agrees and consents for the procedure. All questions were addressed. A time out was performed prior to the initiation of the procedure. Maximal barrier sterile technique utilized including caps, mask, sterile gowns, sterile gloves, large sterile drape, hand hygiene, and Betadine prep. Under sterile conditions and local anesthesia, ultrasound micropuncture access performed of the right common femoral artery. Images obtained for documentation. Five French sheath inserted over a guidewire. C2 catheter advanced and utilized to select the celiac origin. Celiac angiogram performed. Celiac: The celiac origin is widely patent. The splenic, left gastric, hepatic vasculature, and GDA are all visualized and patent. No active arterial GI bleeding demonstrated. However there are dilated branches within the pancreaticoduodenal arcade which are suspicious for sites of bleeding given the known large duodenal ulcer. Renegade STC microcatheter and a GT single angle Glidewire  were utilized to advance the access into the GDA. Selective GDA angiogram performed. GDA: The GDA and gastroepiploic vasculature are all patent. Again, no active arterial bleeding noted but there is 1 dilated tortuous branch from the lower GDA which is suspicious for a bleeding source. GDA micro coil embolization: Microcatheter was advanced to the dilated pancreaticoduodenal branch. 4 and 5  mm interlock micro coils were deployed for embolization. Following embolization, the GDA is occluded. Repeat celiac angiogram confirms preserved patency of the hepatic vasculature and GDA occlusion. Catheter removed. Right common femoral artery access closed with the ExoSeal device and manual compression. No immediate complication. Patient tolerated the procedure well. IMPRESSION: Dilated branches of the pancreaticoduodenal arcade noted off of the mid to distal GDA. Although no active arterial bleeding demonstrated preventative GDA micro coil embolization performed as detailed above for the known large duodenal ulcer by endoscopy. Electronically Signed   By: Jerilynn Mages.  Shick M.D.   On: 11/16/2019 16:11    Subjective: No complaints.   Discharge Exam: Vitals:   11/21/19 2100 11/22/19 0528  BP: 136/60 (!) 114/97  Pulse: 94 92  Resp: (!) 22 (!) 22  Temp: 98.7 F (37.1 C) 98.5 F (36.9 C)  SpO2: 98% 93%   General: Pt is alert, awake, not in acute distress Cardiovascular: RRR, S1/S2 +, no rubs, no gallops Respiratory: CTA bilaterally, no wheezing, no rhonchi Abdominal: Soft, NT, ND, bowel sounds + Extremities: No pitting edema, no cyanosis  Labs: BNP (last 3 results) Recent Labs    05/06/19 1343 08/20/19 1250 11/05/19 2216  BNP 258.1* 511.0* 732.2*   Basic Metabolic Panel: Recent Labs  Lab 11/16/19 0430 11/17/19 0500 11/18/19 0500 11/19/19 0301  NA 147* 142 141 145  K 3.8 3.4* 3.3* 3.6  CL 114* 114* 113* 115*  CO2 22 22 21* 22  GLUCOSE 85 100* 142* 102*  BUN 58* 46* 32* 28*  CREATININE 3.31* 2.64*  2.21* 2.04*  CALCIUM 8.5* 8.0* 7.7* 7.8*   Liver Function Tests: No results for input(s): AST, ALT, ALKPHOS, BILITOT, PROT, ALBUMIN in the last 168 hours. No results for input(s): LIPASE, AMYLASE in the last 168 hours. No results for input(s): AMMONIA in the last 168 hours. CBC: Recent Labs  Lab 11/17/19 0500 11/18/19 0500 11/19/19 0301 11/20/19 0510 11/21/19 0450  WBC 10.3 9.8 9.5 8.0 7.9  HGB 8.7* 8.9* 8.7* 8.8* 9.1*  HCT 26.8* 27.7* 27.3* 28.0* 28.3*  MCV 87.6 89.9 90.7 90.0 89.3  PLT 308 335 355 407* 453*   Cardiac Enzymes: No results for input(s): CKTOTAL, CKMB, CKMBINDEX, TROPONINI in the last 168 hours. BNP: Invalid input(s): POCBNP CBG: Recent Labs  Lab 11/21/19 1112 11/21/19 1704 11/21/19 2055 11/22/19 0804 11/22/19 1204  GLUCAP 81 90 91 90 80   D-Dimer No results for input(s): DDIMER in the last 72 hours. Hgb A1c No results for input(s): HGBA1C in the last 72 hours. Lipid Profile No results for input(s): CHOL, HDL, LDLCALC, TRIG, CHOLHDL, LDLDIRECT in the last 72 hours. Thyroid function studies No results for input(s): TSH, T4TOTAL, T3FREE, THYROIDAB in the last 72 hours.  Invalid input(s): FREET3 Anemia work up No results for input(s): VITAMINB12, FOLATE, FERRITIN, TIBC, IRON, RETICCTPCT in the last 72 hours. Urinalysis    Component Value Date/Time   COLORURINE AMBER (A) 11/06/2019 0130   APPEARANCEUR CLOUDY (A) 11/06/2019 0130   LABSPEC 1.017 11/06/2019 0130   PHURINE 5.0 11/06/2019 0130   GLUCOSEU NEGATIVE 11/06/2019 0130   HGBUR MODERATE (A) 11/06/2019 0130   BILIRUBINUR NEGATIVE 11/06/2019 0130   KETONESUR NEGATIVE 11/06/2019 0130   PROTEINUR 30 (A) 11/06/2019 0130   UROBILINOGEN 0.2 06/28/2014 1500   NITRITE NEGATIVE 11/06/2019 0130   LEUKOCYTESUR LARGE (A) 11/06/2019 0130    Microbiology Recent Results (from the past 240 hour(s))  MRSA PCR Screening     Status: None   Collection Time: 11/14/19  6:30 PM   Specimen: Nasopharyngeal   Result Value Ref Range Status   MRSA by PCR NEGATIVE NEGATIVE Final    Comment:        The GeneXpert MRSA Assay (FDA approved for NASAL specimens only), is one component of a comprehensive MRSA colonization surveillance program. It is not intended to diagnose MRSA infection nor to guide or monitor treatment for MRSA infections. Performed at [facility], [place] [address] [place], [place], [place] [address]   SARS Coronavirus 2 by RT PCR (hospital order, performed in [facility] hospital lab) Nasopharyngeal Nasopharyngeal Swab     Status: None   Collection Time: 11/21/19 10:04 AM   Specimen: Nasopharyngeal Swab  Result Value Ref Range Status   SARS Coronavirus 2 NEGATIVE NEGATIVE Final    Comment: (NOTE) SARS-CoV-2 target nucleic acids are NOT DETECTED.  The SARS-CoV-2 RNA is generally detectable in upper and lower respiratory specimens during the acute phase of infection. The lowest concentration of SARS-CoV-2 viral copies this assay can detect is 250 copies / mL. A negative result does not preclude SARS-CoV-2 infection and should not be used as the sole basis for treatment or other patient management decisions.  A negative result may occur with improper specimen collection / handling, submission of specimen other than nasopharyngeal swab, presence of viral mutation(s) within the areas targeted by this assay, and inadequate number of viral copies (<250 copies / mL). A negative result must be combined with clinical observations, patient history, and epidemiological information.  Fact Sheet for Patients:   StrictlyIdeas.no  Fact Sheet for Healthcare Providers: BankingDealers.co.za  This test is not yet approved or  cleared by the [place] FDA and has been authorized for detection and/or diagnosis of SARS-CoV-2 by FDA under an Emergency Use Authorization (EUA).  This EUA will remain in effect (meaning this test can  be used) for the duration of the COVID-19 declaration under Section 564(b)(1) of the Act, 21 U.S.C. section 360bbb-3(b)(1), unless the authorization is terminated or revoked sooner.  Performed at [facility], [place] [address] [place],  [address]     Time coordinating discharge: Approximately 40 minutes  Annita Brod, MD  [facility] 11/22/2019, 12:10 PM

## 2019-11-23 ENCOUNTER — Inpatient Hospital Stay (HOSPITAL_COMMUNITY): Payer: No Typology Code available for payment source

## 2019-11-23 DIAGNOSIS — I5032 Chronic diastolic (congestive) heart failure: Secondary | ICD-10-CM | POA: Diagnosis present

## 2019-11-23 DIAGNOSIS — I5033 Acute on chronic diastolic (congestive) heart failure: Secondary | ICD-10-CM | POA: Diagnosis present

## 2019-11-23 DIAGNOSIS — I5031 Acute diastolic (congestive) heart failure: Secondary | ICD-10-CM

## 2019-11-23 DIAGNOSIS — R112 Nausea with vomiting, unspecified: Secondary | ICD-10-CM

## 2019-11-23 LAB — ECHOCARDIOGRAM COMPLETE
AR max vel: 0.93 cm2
AV Area VTI: 1.03 cm2
AV Area mean vel: 0.96 cm2
AV Mean grad: 4.5 mmHg
AV Peak grad: 9.3 mmHg
Ao pk vel: 1.53 m/s
Area-P 1/2: 5.02 cm2
Height: 62 in
S' Lateral: 2.6 cm
Single Plane A4C EF: 59.4 %
Weight: 6336 oz

## 2019-11-23 LAB — BASIC METABOLIC PANEL
Anion gap: 11 (ref 5–15)
BUN: 8 mg/dL (ref 8–23)
CO2: 22 mmol/L (ref 22–32)
Calcium: 7.9 mg/dL — ABNORMAL LOW (ref 8.9–10.3)
Chloride: 112 mmol/L — ABNORMAL HIGH (ref 98–111)
Creatinine, Ser: 1.42 mg/dL — ABNORMAL HIGH (ref 0.44–1.00)
GFR calc Af Amer: 44 mL/min — ABNORMAL LOW (ref >60)
GFR calc non Af Amer: 38 mL/min — ABNORMAL LOW (ref >60)
Glucose, Bld: 75 mg/dL (ref 70–99)
Potassium: 3.4 mmol/L — ABNORMAL LOW (ref 3.5–5.1)
Sodium: 145 mmol/L (ref 135–145)

## 2019-11-23 LAB — GLUCOSE, CAPILLARY
Glucose-Capillary: 82 mg/dL (ref 70–99)
Glucose-Capillary: 79 mg/dL (ref 70–99)

## 2019-11-23 MED ORDER — FUROSEMIDE 20 MG PO TABS: ORAL_TABLET | ORAL | 11 refills | Status: DC

## 2019-11-23 MED ORDER — POTASSIUM CHLORIDE CRYS ER 20 MEQ PO TBCR
40.0000 meq | EXTENDED_RELEASE_TABLET | Freq: Once | ORAL | Status: AC
  Administered 2019-11-23: 40 meq via ORAL
  Filled 2019-11-23: qty 2

## 2019-11-23 NOTE — Discharge Summary (Addendum)
Physician Discharge Summary  [name]:[id number] DOB: 1955/09/13 DOA: 11/05/2019  PCP: [name], MD  Admit date: 11/05/2019 Discharge date: 11/23/2019  Admitted From: SNF Disposition: SNF   Recommendations for Outpatient Follow-up:  1. Follow up with PCP at [facility] facility 2. Patient should have a repeat basic metabolic panel checked in 1 week 3. New medication: Lasix 40 mg p.o. twice daily x1 week then decrease to 20 mg p.o. daily 4. Follow up with GI for surveillance of duodenal ulcer. 5. Follow up with orthopedics for continued management (nonoperative) or left femoral fracture.   Home Health: N/A Equipment/Devices: None new Discharge Condition: Stable CODE STATUS: Full Diet recommendation: Heart healthy, carb-modified  Brief/Interim Summary: [name] [name] is a 64 year old female withsupermorbid obesity, hypertension, type 2 diabetes, stage IV chronic kidney disease with baseline creatinine about 1.7, recent left leg injury and fracture recovering at the skilled nursing facility brought to the ER on 9/14 for confusion. Patient was reportedly unresponsive to verbal stimuli and subsequently was incoherent. In the emergency room, she improved spontaneously and responded to questions appropriately. Patient is on multiple medications including narcotics and benzodiazepine. Admitted with mild acute metabolic encephalopathy and acute renal failure. Patient continued to have diarrhea 11/15/2019 on the evening of 11/10/2019 patient started having bloody stools with frank bleeding per rectum with drop in hemoglobin she received 6 unit of packed RBC at that time she had colonoscopy done on 11/14/2019 showed diverticuli in the ascending colon otherwise with normal exam however it was not an optimal study. Patient continues with slow drifting down of hemoglobin received on the unit of packed RBC on 11/14/2019 so far she has received a total of 8  units of packed RBC.  On 11/16/2019 patient had GDA embolization by interventional radiology for ongoing GI bleed. Hemoglobin has stabilized since that time. GI has signed off. Plan is for return to SNF for rehabilitation.   Discharge Diagnoses:  Principal Problem:   Acute renal failure ([place]) Active Problems:   Type 2 diabetes mellitus with stage 3 chronic kidney disease (HCC)   Metabolic encephalopathy   CKD (chronic kidney disease), stage III (HCC)   Pressure injury of skin   Morbid obesity (HCC)   GI bleeding   Encephalopathy acute   Vaginal discharge   Anemia associated with acute blood loss   Acute diastolic CHF (congestive heart failure) ([place])  AKI with CKD stage IIIb: Improving with resolution of hypotension, withholding thiazide diuretic.  - Overall CrCl is at/better than previous baseline now.  By day of discharge, creatinine at 1.42 with GFR of 44.  It likely will slightly worsened due to continue diuresis although important to try to keep patient euvolemic. - Avoid nephrotoxins.   Hemorrhagic shock (present on admission) caused by Acute on chronic symptomatic blood loss anemia secondary to upper GI bleed from duodenal ulcer s/p GDA 9/25: s/p 8u PRBCs total. - Continue monitoring CBC. Hgb stable for several days prior to discharge.  - Continue PPI BID - F/u with GI as outpatient.   Acute metabolic encephalopathy: Resolved. Thought to be likely related to polypharmacy.  - Decrease gabapentin to 600mg  po BID from 1,800mg  qAM.  - Change oxycodone 10mg  to hydrocodone 5mg  dosing prn (has tolerated here) - Decrease benzodiazepine dosing  HTN: BP rising while holding thiazide, will restart at DC.  T2DM with hypoglycemic episodes on SSI. - Return to po medications at discharge.   Anxiety: Patient continued on as needed Xanax.  She  had some episodes of vomiting the night before because she was feeling very anxious.  Abdominal x-ray unremarkable.  Patient feeling better after  she had taken some Xanax.  Acute diastolic heart failure: On admission, patient noted to have an elevated BNP of 609.  Because of concerns for her acute kidney injury and blood loss, immediate work-up was not done.  With her kidney injury, BNP may be falsely elevated.  With her morbid obesity, BNP may be falsely decreased.  Repeat BNP done on 10/1 still elevated at 333.  Patient started on IV Lasix and within 24 hours time, had diuresed over 1100 cc.  Has remained on room air so not felt to have any acute respiratory failure from this.  Echocardiogram done on 10/2, day of discharge.  Noted to have preserved ejection fraction and normal valvular function, but diastolic dysfunction was indeterminate.  Given elevated BNP and significant output with diuresis, she does likely indeed have diastolic dysfunction.  She will be discharged on 40 mg of Lasix p.o. twice daily and after 1 week decrease to 20 mg p.o. twice daily.  Primary care physician at skilled nursing facility advised to check a basic metabolic panel in 1 week.  HLD:  - Continue statin  UTI:  - S/p CTX x3 days  Hypokalemia: Supplemented  Hypocalcemia:  - Supplemented.   Left femoral fracture with partially healed malunion of her femoral condyle: Operative risk precludes surgery.  - LLE Weight Bearing: Non weight bearing Other Position/Activity Restrictions: Per Dr. Reather Littler 9/5 note: NWB pillow splints. Bed to chair stabilizing left knee. Need To rexray in three weeks. Most likely patient will be wheel chair bound even with healing.    Obesity: Estimated body mass index is 54.69 kg/m  EGD RESULTS 11/15/2019 Dr. Benson Norway:  Impression:               - Normal esophagus.                           - Red blood in the gastric body, in the gastric                            antrum and in the pylorus.                           - Adherent clot duodenal ulcer with an adherent                            clot (Forrest Class IIb). Injected.  hemostatic                            spray applied.                           - No specimens collected. Moderate Sedation:      Not Applicable - Patient had care per Anesthesia. Recommendation:           - Return patient to hospital ward for ongoing care.                           - NPO.                           -  Continue present medications.                           - Follow HGB and transfuse as necessary.                           - ? Relook EGD in 2 days.  Discharge Instructions  Allergies as of 11/23/2019      Reactions   Morphine Itching, Rash   Penicillins Itching, Nausea And Vomiting, Rash, Hives   Has patient had a PCN reaction causing immediate rash, facial/tongue/throat swelling, SOB or lightheadedness with hypotension: No Has patient had a PCN reaction causing severe rash involving mucus membranes or skin necrosis: No Has patient had a PCN reaction that required hospitalization: No Has patient had a PCN reaction occurring within the last 10 years: No If all of the above answers are "NO", then may proceed with Cephalosporin use. Has patient had a PCN reaction causing immediate rash, facial/tongue/throat swelling, SOB or lightheadedness with hypotension: No Has patient had a PCN reaction causing severe rash involving mucus membranes or skin necrosis: No Has patient had a PCN reaction that required hospitalization: No Has patient had a PCN reaction occurring within the last 10 years: No If all of the above answers are "NO", then may proceed with Cephalosporin use.      Medication List    STOP taking these medications   oxyCODONE-acetaminophen 10-325 MG tablet Commonly known as: PERCOCET     TAKE these medications   allopurinol 100 MG tablet Commonly known as: ZYLOPRIM Take 100 mg by mouth 2 (two) times daily.   ALPRAZolam 0.5 MG tablet Commonly known as: XANAX Take 1 tablet (0.5 mg total) by mouth 3 (three) times daily as needed for anxiety. What changed:    medication strength  how much to take   atenolol-chlorthalidone 50-25 MG tablet Commonly known as: TENORETIC Take 1 tablet by mouth daily.   atorvastatin 10 MG tablet Commonly known as: LIPITOR Take 10 mg by mouth daily.   DULoxetine 30 MG capsule Commonly known as: CYMBALTA Take 30 mg by mouth 2 (two) times daily.   furosemide 20 MG tablet Commonly known as: Lasix Take 2 tablets (40 mg total) by mouth 2 (two) times daily for 7 days, THEN 1 tablet (20 mg total) 2 (two) times daily. Start taking on: November 23, 2019   gabapentin 600 MG tablet Commonly known as: NEURONTIN Take 1 tablet (600 mg total) by mouth 2 (two) times daily. What changed:   how much to take  when to take this   HYDROcodone-acetaminophen 5-325 MG tablet Commonly known as: NORCO/VICODIN Take 1 tablet by mouth every 8 (eight) hours as needed for severe pain.   hydrOXYzine 50 MG tablet Commonly known as: ATARAX/VISTARIL Take 50 mg by mouth every 8 (eight) hours as needed for anxiety or itching.   pantoprazole 40 MG tablet Commonly known as: Protonix Take 1 tablet (40 mg total) by mouth 2 (two) times daily.   pioglitazone 30 MG tablet Commonly known as: ACTOS Take 30 mg by mouth daily.   senna 8.6 MG Tabs tablet Commonly known as: SENOKOT Take 1 tablet (8.6 mg total) by mouth daily.   sitaGLIPtin 100 MG tablet Commonly known as: JANUVIA Take 100 mg by mouth daily.   sorbitol 70 % Soln Take 30 mLs by mouth 2 (two) times daily. As a second choice for constipation if you  do not stool with senna       Contact information for follow-up providers    [name], MD Follow up.   Specialty: Family Medicine Contact information: [place] [address] [phone]        Susa Day, MD. Schedule an appointment as soon as possible for a visit in 1 week(s).   Specialty: Orthopedic Surgery Contact information: [address] [place]  [address] [phone]        Carol Ada, MD. Schedule an appointment as soon as possible for a visit in 1 month(s).   Specialty: Gastroenterology Contact information: [address] [place]  [address] [phone]            Contact information for after-discharge care    Destination    [facility] .   Service: Skilled Nursing Contact information: [address] [address] [phone]                 Allergies  Allergen Reactions  . Morphine Itching and Rash  . Penicillins Itching, Nausea And Vomiting, Rash and Hives    Has patient had a PCN reaction causing immediate rash, facial/tongue/throat swelling, SOB or lightheadedness with hypotension: No Has patient had a PCN reaction causing severe rash involving mucus membranes or skin necrosis: No Has patient had a PCN reaction that required hospitalization: No Has patient had a PCN reaction occurring within the last 10 years: No If all of the above answers are "NO", then may proceed with Cephalosporin use.  Has patient had a PCN reaction causing immediate rash, facial/tongue/throat swelling, SOB or lightheadedness with hypotension: No Has patient had a PCN reaction causing severe rash involving mucus membranes or skin necrosis: No Has patient had a PCN reaction that required hospitalization: No Has patient had a PCN reaction occurring within the last 10 years: No If all of the above answers are "NO", then may proceed with Cephalosporin use.     Consultations:  Nephrology  GI  IR  Procedures/Studies: CT ABDOMEN PELVIS WO CONTRAST  Result Date: 11/06/2019 CLINICAL DATA:  Abdominal pain.  Vaginal discharge EXAM: CT ABDOMEN AND PELVIS WITHOUT CONTRAST TECHNIQUE: Multidetector CT imaging of the abdomen and pelvis was performed following the standard protocol without oral or IV contrast. COMPARISON:  October 18, 2019 FINDINGS: Lower chest: There  is bibasilar atelectasis. Hepatobiliary: No focal liver lesions are appreciable on this noncontrast enhanced study. Gallbladder wall is not appreciably thickened. There is no biliary duct dilatation. Pancreas: There is no pancreatic mass or inflammatory focus. Spleen: No splenic lesions are evident. Adrenals/Urinary Tract: Adrenals bilaterally appear normal. There is no evident renal mass on either side. Calcification along the posterior cortex of the left upper to mid kidney is a stable finding without associated mass effect. There is no evident hydronephrosis on either side. There is no appreciable renal or ureteral calculus on either side. Urinary bladder is midline with wall thickness within normal limits. Note that a Foley catheter is present within the bladder. Stomach/Bowel: There is moderate stool throughout the colon. There is no appreciable bowel wall or mesenteric thickening. There is no demonstrable bowel obstruction. There is no appreciable free air or portal venous air. The terminal ileum appears normal. Vascular/Lymphatic: There is no abdominal aortic aneurysm. There is aortic and iliac artery atherosclerotic calcification. No adenopathy is appreciable in the abdomen or pelvis. Reproductive: Uterus is absent.  No evident pelvic mass. Other: No periappendiceal region  inflammatory change. No abscess or ascites in the abdomen or pelvis. There is apparent scarring throughout the left lateral abdominal wall. There is no fluid in this area. Musculoskeletal: Arthropathy in the lumbar spine is again noted. There is severe disc space narrowing at L4-5 with 5 mm of anterolisthesis of L5 on S1, stable. There are pars defects at L4 and L5 bilaterally. No blastic or lytic bone lesions. No intramuscular lesions are demonstrable. IMPRESSION: 1. No bowel wall thickening or bowel obstruction is currently appreciable. No abscess in the abdomen pelvis. No periappendiceal region inflammation evident. 2. No renal or  ureteral calculus. No hydronephrosis. Calcification along the left kidney posteriorly likely represents previous trauma or inflammation, stable. Foley catheter within urinary bladder. 3.  Scarring lateral left abdomen, grossly stable. 4. Extensive lower lumbar arthropathy. Spondylolisthesis at L4-5. 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 5.  Aortic Atherosclerosis (ICD10-I70.0). 6.  Uterus absent. Electronically Signed   By: Lowella Grip III M.D.   On: 11/06/2019 08:44   DG Knee 2 Views Left  Result Date: 10/25/2019 CLINICAL DATA:  Follow-up femur fracture EXAM: LEFT KNEE - 1-2 VIEW COMPARISON:  08/16/2019 FINDINGS: Comminuted intra-articular distal femoral fracture involves the lateral femoral condyle. The fracture appears impacted. Some periosteal new bone formation presumably due to subacute process. Vascular calcification. Advanced medial and patellofemoral degenerative change. No large knee effusion. IMPRESSION: Comminuted, impacted intra-articular fracture involving the lateral femoral condyle and intercondylar region. Some periosteal new bone formation, suggesting subacute process. Electronically Signed   By: Donavan Foil M.D.   On: 10/25/2019 19:45   CT Head Wo Contrast  Result Date: 11/05/2019 CLINICAL DATA:  Mental status change EXAM: CT HEAD WITHOUT CONTRAST TECHNIQUE: Contiguous axial images were obtained from the base of the skull through the vertex without intravenous contrast. COMPARISON:  October 27, 2013 FINDINGS: Brain: No evidence of acute territorial infarction, hemorrhage, hydrocephalus,extra-axial collection or mass lesion/mass effect. Normal gray-white differentiation. Ventricles are normal in size and contour. Vascular: No hyperdense vessel or unexpected calcification. Skull: The skull is intact. No fracture or focal lesion identified. Sinuses/Orbits: The visualized paranasal sinuses and mastoid air cells are clear. The orbits and globes intact. Other: None IMPRESSION: No acute  intracranial abnormality. Electronically Signed   By: Prudencio Pair M.D.   On: 11/05/2019 18:48   IR Angiogram Visceral Selective  Result Date: 11/16/2019 INDICATION: Bleeding duodenal ulcer by endoscopy, blood-loss anemia EXAM: ULTRASOUND GUIDANCE FOR VASCULAR ACCESS CELIAC AND GDA CATHETERIZATIONS AND ANGIOGRAMS GDA MICRO COIL EMBOLIZATION MEDICATIONS: 1% lidocaine local. ANESTHESIA/SEDATION: Moderate (conscious) sedation was employed during this procedure. A total of Versed 1.5 mg and Fentanyl 50 mcg was administered intravenously. Moderate Sedation Time: 40 minutes. The patient's level of consciousness and vital signs were monitored continuously by radiology nursing throughout the procedure under my direct supervision. CONTRAST:  70 cc omni 300 FLUOROSCOPY TIME:  Fluoroscopy Time: 9 minutes 0 seconds (3,066 mGy). COMPLICATIONS: None immediate. PROCEDURE: Informed consent was obtained from the patient following explanation of the procedure, risks, benefits and alternatives. The patient understands, agrees and consents for the procedure. All questions were addressed. A time out was performed prior to the initiation of the procedure. Maximal barrier sterile technique utilized including caps, mask, sterile gowns, sterile gloves, large sterile drape, hand hygiene, and Betadine prep. Under sterile conditions and local anesthesia, ultrasound micropuncture access performed of the right common femoral artery. Images obtained for documentation. Five French sheath inserted over a guidewire. C2 catheter advanced and utilized to select the celiac origin.  Celiac angiogram performed. Celiac: The celiac origin is widely patent. The splenic, left gastric, hepatic vasculature, and GDA are all visualized and patent. No active arterial GI bleeding demonstrated. However there are dilated branches within the pancreaticoduodenal arcade which are suspicious for sites of bleeding given the known large duodenal ulcer. Renegade STC  microcatheter and a GT single angle Glidewire were utilized to advance the access into the GDA. Selective GDA angiogram performed. GDA: The GDA and gastroepiploic vasculature are all patent. Again, no active arterial bleeding noted but there is 1 dilated tortuous branch from the lower GDA which is suspicious for a bleeding source. GDA micro coil embolization: Microcatheter was advanced to the dilated pancreaticoduodenal branch. 4 and 5 mm interlock micro coils were deployed for embolization. Following embolization, the GDA is occluded. Repeat celiac angiogram confirms preserved patency of the hepatic vasculature and GDA occlusion. Catheter removed. Right common femoral artery access closed with the ExoSeal device and manual compression. No immediate complication. Patient tolerated the procedure well. IMPRESSION: Dilated branches of the pancreaticoduodenal arcade noted off of the mid to distal GDA. Although no active arterial bleeding demonstrated preventative GDA micro coil embolization performed as detailed above for the known large duodenal ulcer by endoscopy. Electronically Signed   By: Jerilynn Mages.  Shick M.D.   On: 11/16/2019 16:11   IR Angiogram Selective Each Additional Vessel  Result Date: 11/16/2019 INDICATION: Bleeding duodenal ulcer by endoscopy, blood-loss anemia EXAM: ULTRASOUND GUIDANCE FOR VASCULAR ACCESS CELIAC AND GDA CATHETERIZATIONS AND ANGIOGRAMS GDA MICRO COIL EMBOLIZATION MEDICATIONS: 1% lidocaine local. ANESTHESIA/SEDATION: Moderate (conscious) sedation was employed during this procedure. A total of Versed 1.5 mg and Fentanyl 50 mcg was administered intravenously. Moderate Sedation Time: 40 minutes. The patient's level of consciousness and vital signs were monitored continuously by radiology nursing throughout the procedure under my direct supervision. CONTRAST:  70 cc omni 300 FLUOROSCOPY TIME:  Fluoroscopy Time: 9 minutes 0 seconds (3,066 mGy). COMPLICATIONS: None immediate. PROCEDURE: Informed  consent was obtained from the patient following explanation of the procedure, risks, benefits and alternatives. The patient understands, agrees and consents for the procedure. All questions were addressed. A time out was performed prior to the initiation of the procedure. Maximal barrier sterile technique utilized including caps, mask, sterile gowns, sterile gloves, large sterile drape, hand hygiene, and Betadine prep. Under sterile conditions and local anesthesia, ultrasound micropuncture access performed of the right common femoral artery. Images obtained for documentation. Five French sheath inserted over a guidewire. C2 catheter advanced and utilized to select the celiac origin. Celiac angiogram performed. Celiac: The celiac origin is widely patent. The splenic, left gastric, hepatic vasculature, and GDA are all visualized and patent. No active arterial GI bleeding demonstrated. However there are dilated branches within the pancreaticoduodenal arcade which are suspicious for sites of bleeding given the known large duodenal ulcer. Renegade STC microcatheter and a GT single angle Glidewire were utilized to advance the access into the GDA. Selective GDA angiogram performed. GDA: The GDA and gastroepiploic vasculature are all patent. Again, no active arterial bleeding noted but there is 1 dilated tortuous branch from the lower GDA which is suspicious for a bleeding source. GDA micro coil embolization: Microcatheter was advanced to the dilated pancreaticoduodenal branch. 4 and 5 mm interlock micro coils were deployed for embolization. Following embolization, the GDA is occluded. Repeat celiac angiogram confirms preserved patency of the hepatic vasculature and GDA occlusion. Catheter removed. Right common femoral artery access closed with the ExoSeal device and manual compression. No immediate complication. Patient tolerated  the procedure well. IMPRESSION: Dilated branches of the pancreaticoduodenal arcade noted off  of the mid to distal GDA. Although no active arterial bleeding demonstrated preventative GDA micro coil embolization performed as detailed above for the known large duodenal ulcer by endoscopy. Electronically Signed   By: Jerilynn Mages.  Shick M.D.   On: 11/16/2019 16:11   IR Fluoro Guide CV Line Right  Result Date: 11/06/2019 INDICATION: 64 year old female with acute kidney injury in need of durable venous access. She is morbidly obese and presents for placement of a tunneled central venous catheter. EXAM: IR ULTRASOUND GUIDANCE VASC ACCESS RIGHT; IR RIGHT FLUORO GUIDE CV LINE MEDICATIONS: None ANESTHESIA/SEDATION: None FLUOROSCOPY TIME:  Fluoroscopy Time: 0 minutes 24 seconds (11 mGy). COMPLICATIONS: None immediate. PROCEDURE: Informed written consent was obtained from the patient after a thorough discussion of the procedural risks, benefits and alternatives. All questions were addressed. Maximal Sterile Barrier Technique was utilized including caps, mask, sterile gowns, sterile gloves, sterile drape, hand hygiene and skin antiseptic. A timeout was performed prior to the initiation of the procedure. The right internal jugular vein was interrogated with ultrasound and found to be widely patent. An image was obtained and stored for the medical record. Local anesthesia was attained by infiltration with 1% lidocaine. A small dermatotomy was made. Under real-time sonographic guidance, the vessel was punctured with a 21 gauge micropuncture needle. Using standard technique, the initial micro needle was exchanged over a 0.018 micro wire for a peel-away sheath. A suitable skin exit site was selected. Local anesthesia was again attained by infiltration with 1% lidocaine. A small dermatotomy was made. A dual lumen power injectable central venous catheter was then tunneled from the dermatotomy at the skin exit site to the dermatotomy overlying the venous access site. The catheter was then cut to length and advanced through the  peel-away sheath and position with the tip in the upper right atrium. The catheter was secured to the skin with 0 Prolene suture. The catheter flushes and aspirates easily. The catheter was flushed and capped. The dermatotomy at the venous access site was sealed with Dermabond. Sterile bandages were applied. IMPRESSION: Placement of a right IJ approach tunneled central venous catheter. The catheter tips are in the upper right atrium and the catheter is ready for immediate use. Electronically Signed   By: Jacqulynn Cadet M.D.   On: 11/06/2019 16:51   IR Removal Tun Cv Cath W/O FL  Result Date: 11/21/2019 INDICATION: Patient with history of chronic kidney disease, anemia secondary to upper GI bleed from duodenal ulcer, poor venous access with previously placed tunneled right internal jugular central venous catheter on 11/06/2019; patient is awaiting discharge to nursing facility and request now received for tunneled central venous catheter removal EXAM: REMOVAL TUNNELED CENTRAL VENOUS CATHETER MEDICATIONS: None ANESTHESIA/SEDATION: None FLUOROSCOPY TIME:  None COMPLICATIONS: None immediate. PROCEDURE: Informed consent was obtained from the patient after a thorough discussion of the procedural risks, benefits and alternatives. All questions were addressed. Maximal Sterile Barrier Technique was utilized including sterile gloves, sterile drape, hand hygiene and skin antiseptic. A timeout was performed prior to the initiation of the procedure. The patient's right chest and catheter was prepped and draped in a normal sterile fashion. Sutures were cut. Using gentle manual traction the central venous catheter was removed in it's entirety. Pressure was held till hemostasis was obtained. A sterile dressing was applied. The patient tolerated the procedure well with no immediate complications. IMPRESSION: Successful catheter removal as described above. Read by: Rowe Robert, PA-C Electronically  Signed   By: Ruthann Cancer  MD   On: 11/21/2019 10:59   IR US Guide Vasc Access Right  Result Date: 11/16/2019 INDICATION: Bleeding duodenal ulcer by endoscopy, blood-loss anemia EXAM: ULTRASOUND GUIDANCE FOR VASCULAR ACCESS CELIAC AND GDA CATHETERIZATIONS AND ANGIOGRAMS GDA MICRO COIL EMBOLIZATION MEDICATIONS: 1% lidocaine local. ANESTHESIA/SEDATION: Moderate (conscious) sedation was employed during this procedure. A total of Versed 1.5 mg and Fentanyl 50 mcg was administered intravenously. Moderate Sedation Time: 40 minutes. The patient's level of consciousness and vital signs were monitored continuously by radiology nursing throughout the procedure under my direct supervision. CONTRAST:  70 cc omni 300 FLUOROSCOPY TIME:  Fluoroscopy Time: 9 minutes 0 seconds (3,066 mGy). COMPLICATIONS: None immediate. PROCEDURE: Informed consent was obtained from the patient following explanation of the procedure, risks, benefits and alternatives. The patient understands, agrees and consents for the procedure. All questions were addressed. A time out was performed prior to the initiation of the procedure. Maximal barrier sterile technique utilized including caps, mask, sterile gowns, sterile gloves, large sterile drape, hand hygiene, and Betadine prep. Under sterile conditions and local anesthesia, ultrasound micropuncture access performed of the right common femoral artery. Images obtained for documentation. Five French sheath inserted over a guidewire. C2 catheter advanced and utilized to select the celiac origin. Celiac angiogram performed. Celiac: The celiac origin is widely patent. The splenic, left gastric, hepatic vasculature, and GDA are all visualized and patent. No active arterial GI bleeding demonstrated. However there are dilated branches within the pancreaticoduodenal arcade which are suspicious for sites of bleeding given the known large duodenal ulcer. Renegade STC microcatheter and a GT single angle Glidewire were utilized to advance the  access into the GDA. Selective GDA angiogram performed. GDA: The GDA and gastroepiploic vasculature are all patent. Again, no active arterial bleeding noted but there is 1 dilated tortuous branch from the lower GDA which is suspicious for a bleeding source. GDA micro coil embolization: Microcatheter was advanced to the dilated pancreaticoduodenal branch. 4 and 5 mm interlock micro coils were deployed for embolization. Following embolization, the GDA is occluded. Repeat celiac angiogram confirms preserved patency of the hepatic vasculature and GDA occlusion. Catheter removed. Right common femoral artery access closed with the ExoSeal device and manual compression. No immediate complication. Patient tolerated the procedure well. IMPRESSION: Dilated branches of the pancreaticoduodenal arcade noted off of the mid to distal GDA. Although no active arterial bleeding demonstrated preventative GDA micro coil embolization performed as detailed above for the known large duodenal ulcer by endoscopy. Electronically Signed   By: Jerilynn Mages.  Shick M.D.   On: 11/16/2019 16:11   IR US Guide Vasc Access Right  Result Date: 11/06/2019 INDICATION: 65 year old female with acute kidney injury in need of durable venous access. She is morbidly obese and presents for placement of a tunneled central venous catheter. EXAM: IR ULTRASOUND GUIDANCE VASC ACCESS RIGHT; IR RIGHT FLUORO GUIDE CV LINE MEDICATIONS: None ANESTHESIA/SEDATION: None FLUOROSCOPY TIME:  Fluoroscopy Time: 0 minutes 24 seconds (11 mGy). COMPLICATIONS: None immediate. PROCEDURE: Informed written consent was obtained from the patient after a thorough discussion of the procedural risks, benefits and alternatives. All questions were addressed. Maximal Sterile Barrier Technique was utilized including caps, mask, sterile gowns, sterile gloves, sterile drape, hand hygiene and skin antiseptic. A timeout was performed prior to the initiation of the procedure. The right internal jugular  vein was interrogated with ultrasound and found to be widely patent. An image was obtained and stored for the medical record. Local anesthesia was attained  by infiltration with 1% lidocaine. A small dermatotomy was made. Under real-time sonographic guidance, the vessel was punctured with a 21 gauge micropuncture needle. Using standard technique, the initial micro needle was exchanged over a 0.018 micro wire for a peel-away sheath. A suitable skin exit site was selected. Local anesthesia was again attained by infiltration with 1% lidocaine. A small dermatotomy was made. A dual lumen power injectable central venous catheter was then tunneled from the dermatotomy at the skin exit site to the dermatotomy overlying the venous access site. The catheter was then cut to length and advanced through the peel-away sheath and position with the tip in the upper right atrium. The catheter was secured to the skin with 0 Prolene suture. The catheter flushes and aspirates easily. The catheter was flushed and capped. The dermatotomy at the venous access site was sealed with Dermabond. Sterile bandages were applied. IMPRESSION: Placement of a right IJ approach tunneled central venous catheter. The catheter tips are in the upper right atrium and the catheter is ready for immediate use. Electronically Signed   By: Jacqulynn Cadet M.D.   On: 11/06/2019 16:51   DG Chest Portable 1 View  Result Date: 11/05/2019 CLINICAL DATA:  Altered mental status EXAM: PORTABLE CHEST 1 VIEW COMPARISON:  October 07, 2019 FINDINGS: The heart size and mediastinal contours are partially obscured, however there is mild cardiomegaly. There is prominence of the central pulmonary vasculature. Hazy airspace opacity seen throughout the right lung. The left costophrenic angle is excluded from view. No definite acute osseous abnormality. There is stable appearance to the right distal clavicle. IMPRESSION: Limited due to technique. Pulmonary vascular congestion  and hazy airspace opacity throughout the right lung. This could be due to asymmetric edema and/or infectious etiology. Electronically Signed   By: Prudencio Pair M.D.   On: 11/05/2019 23:29   DG ABD ACUTE 2+V W 1V CHEST  Result Date: 10/25/2019 CLINICAL DATA:  Abdominal pain and constipation. EXAM: DG ABDOMEN ACUTE W/ 1V CHEST COMPARISON:  CT 6 days ago 10/18/2019 FINDINGS: Lung evaluation is limited by portable technique and soft tissue attenuation from habitus. Prominent heart size likely accentuated by mediastinal opponent ptosis when correlated with recent abdominal CT lung bases. No significant pleural effusion or confluent airspace disease. No bowel dilatation to suggest obstruction. Air throughout nondilated colon. Small volume of formed stool in the colon. Small volume of stool in the rectum. No visualized radiopaque calculi. There are vascular calcifications. Patient's abdomen leans to the left. Limited osseous structure assessment without evidence of acute osseous abnormality. IMPRESSION: Normal bowel gas pattern. No evidence of obstruction. Small volume of formed stool in the colon. No findings to suggest fecal impaction. Electronically Signed   By: Keith Rake M.D.   On: 10/25/2019 17:02   DG Abd Portable 1V  Result Date: 11/22/2019 CLINICAL DATA:  Nausea and vomiting EXAM: PORTABLE ABDOMEN - 1 VIEW COMPARISON:  10/25/2019 FINDINGS: Limited study due to morbid obesity. Normal bowel gas pattern. No dilated bowel loops. Embolization coils are present in the epigastric region. IMPRESSION: Normal bowel gas pattern.  Limited study. Electronically Signed   By: Franchot Gallo M.D.   On: 11/22/2019 09:50   IR EMBO ART  VEN HEMORR LYMPH EXTRAV  INC GUIDE ROADMAPPING  Result Date: 11/16/2019 INDICATION: Bleeding duodenal ulcer by endoscopy, blood-loss anemia EXAM: ULTRASOUND GUIDANCE FOR VASCULAR ACCESS CELIAC AND GDA CATHETERIZATIONS AND ANGIOGRAMS GDA MICRO COIL EMBOLIZATION MEDICATIONS: 1%  lidocaine local. ANESTHESIA/SEDATION: Moderate (conscious) sedation was employed during this procedure.  A total of Versed 1.5 mg and Fentanyl 50 mcg was administered intravenously. Moderate Sedation Time: 40 minutes. The patient's level of consciousness and vital signs were monitored continuously by radiology nursing throughout the procedure under my direct supervision. CONTRAST:  70 cc omni 300 FLUOROSCOPY TIME:  Fluoroscopy Time: 9 minutes 0 seconds (3,066 mGy). COMPLICATIONS: None immediate. PROCEDURE: Informed consent was obtained from the patient following explanation of the procedure, risks, benefits and alternatives. The patient understands, agrees and consents for the procedure. All questions were addressed. A time out was performed prior to the initiation of the procedure. Maximal barrier sterile technique utilized including caps, mask, sterile gowns, sterile gloves, large sterile drape, hand hygiene, and Betadine prep. Under sterile conditions and local anesthesia, ultrasound micropuncture access performed of the right common femoral artery. Images obtained for documentation. Five French sheath inserted over a guidewire. C2 catheter advanced and utilized to select the celiac origin. Celiac angiogram performed. Celiac: The celiac origin is widely patent. The splenic, left gastric, hepatic vasculature, and GDA are all visualized and patent. No active arterial GI bleeding demonstrated. However there are dilated branches within the pancreaticoduodenal arcade which are suspicious for sites of bleeding given the known large duodenal ulcer. Renegade STC microcatheter and a GT single angle Glidewire were utilized to advance the access into the GDA. Selective GDA angiogram performed. GDA: The GDA and gastroepiploic vasculature are all patent. Again, no active arterial bleeding noted but there is 1 dilated tortuous branch from the lower GDA which is suspicious for a bleeding source. GDA micro coil embolization:  Microcatheter was advanced to the dilated pancreaticoduodenal branch. 4 and 5 mm interlock micro coils were deployed for embolization. Following embolization, the GDA is occluded. Repeat celiac angiogram confirms preserved patency of the hepatic vasculature and GDA occlusion. Catheter removed. Right common femoral artery access closed with the ExoSeal device and manual compression. No immediate complication. Patient tolerated the procedure well. IMPRESSION: Dilated branches of the pancreaticoduodenal arcade noted off of the mid to distal GDA. Although no active arterial bleeding demonstrated preventative GDA micro coil embolization performed as detailed above for the known large duodenal ulcer by endoscopy. Electronically Signed   By: Jerilynn Mages.  Shick M.D.   On: 11/16/2019 16:11    Subjective: No complaints.   Discharge Exam: Vitals:   11/22/19 2110 11/23/19 0515  BP: (!) 160/60 (!) 146/58  Pulse: 92 100  Resp: 20 19  Temp: 98.8 F (37.1 C) 98.4 F (36.9 C)  SpO2: 94% 92%   General: Pt is alert, awake, not in acute distress Cardiovascular: RRR, S1/S2 +, no rubs, no gallops Respiratory: CTA bilaterally, no wheezing, no rhonchi Abdominal: Soft, NT, ND, bowel sounds + Extremities: No pitting edema, no cyanosis  Labs: BNP (last 3 results) Recent Labs    08/20/19 1250 11/05/19 2216 11/22/19 1417  BNP 511.0* 608.6* 193.7*   Basic Metabolic Panel: Recent Labs  Lab 11/17/19 0500 11/18/19 0500 11/19/19 0301 11/22/19 1417 11/23/19 0437  NA 142 141 145 145 145  K 3.4* 3.3* 3.6 3.2* 3.4*  CL 114* 113* 115* 112* 112*  CO2 22 21* 22 22 22   GLUCOSE 100* 142* 102* 97 75  BUN 46* 32* 28* 9 8  CREATININE 2.64* 2.21* 2.04* 1.36* 1.42*  CALCIUM 8.0* 7.7* 7.8* 8.2* 7.9*   Liver Function Tests: No results for input(s): AST, ALT, ALKPHOS, BILITOT, PROT, ALBUMIN in the last 168 hours. No results for input(s): LIPASE, AMYLASE in the last 168 hours. No results for input(s): AMMONIA in  the last 168  hours. CBC: Recent Labs  Lab 11/17/19 0500 11/18/19 0500 11/19/19 0301 11/20/19 0510 11/21/19 0450  WBC 10.3 9.8 9.5 8.0 7.9  HGB 8.7* 8.9* 8.7* 8.8* 9.1*  HCT 26.8* 27.7* 27.3* 28.0* 28.3*  MCV 87.6 89.9 90.7 90.0 89.3  PLT 308 335 355 407* 453*   Cardiac Enzymes: No results for input(s): CKTOTAL, CKMB, CKMBINDEX, TROPONINI in the last 168 hours. BNP: Invalid input(s): POCBNP CBG: Recent Labs  Lab 11/22/19 1204 11/22/19 1810 11/22/19 2106 11/23/19 0713 11/23/19 1125  GLUCAP 80 86 93 82 79   D-Dimer No results for input(s): DDIMER in the last 72 hours. Hgb A1c No results for input(s): HGBA1C in the last 72 hours. Lipid Profile No results for input(s): CHOL, HDL, LDLCALC, TRIG, CHOLHDL, LDLDIRECT in the last 72 hours. Thyroid function studies No results for input(s): TSH, T4TOTAL, T3FREE, THYROIDAB in the last 72 hours.  Invalid input(s): FREET3 Anemia work up No results for input(s): VITAMINB12, FOLATE, FERRITIN, TIBC, IRON, RETICCTPCT in the last 72 hours. Urinalysis    Component Value Date/Time   COLORURINE AMBER (A) 11/06/2019 0130   APPEARANCEUR CLOUDY (A) 11/06/2019 0130   LABSPEC 1.017 11/06/2019 0130   PHURINE 5.0 11/06/2019 0130   GLUCOSEU NEGATIVE 11/06/2019 0130   HGBUR MODERATE (A) 11/06/2019 0130   BILIRUBINUR NEGATIVE 11/06/2019 0130   KETONESUR NEGATIVE 11/06/2019 0130   PROTEINUR 30 (A) 11/06/2019 0130   UROBILINOGEN 0.2 06/28/2014 1500   NITRITE NEGATIVE 11/06/2019 0130   LEUKOCYTESUR LARGE (A) 11/06/2019 0130    Microbiology Recent Results (from the past 240 hour(s))  MRSA PCR Screening     Status: None   Collection Time: 11/14/19  6:30 PM   Specimen: Nasopharyngeal  Result Value Ref Range Status   MRSA by PCR NEGATIVE NEGATIVE Final    Comment:        The GeneXpert MRSA Assay (FDA approved for NASAL specimens only), is one component of a comprehensive MRSA colonization surveillance program. It is not intended to diagnose  MRSA infection nor to guide or monitor treatment for MRSA infections. Performed at [facility], [place] [address] [place], [place] [address]   SARS Coronavirus 2 by RT PCR (hospital order, performed in [facility] hospital lab) Nasopharyngeal Nasopharyngeal Swab     Status: None   Collection Time: 11/21/19 10:04 AM   Specimen: Nasopharyngeal Swab  Result Value Ref Range Status   SARS Coronavirus 2 NEGATIVE NEGATIVE Final    Comment: (NOTE) SARS-CoV-2 target nucleic acids are NOT DETECTED.  The SARS-CoV-2 RNA is generally detectable in upper and lower respiratory specimens during the acute phase of infection. The lowest concentration of SARS-CoV-2 viral copies this assay can detect is 250 copies / mL. A negative result does not preclude SARS-CoV-2 infection and should not be used as the sole basis for treatment or other patient management decisions.  A negative result may occur with improper specimen collection / handling, submission of specimen other than nasopharyngeal swab, presence of viral mutation(s) within the areas targeted by this assay, and inadequate number of viral copies (<250 copies / mL). A negative result must be combined with clinical observations, patient history, and epidemiological information.  Fact Sheet for Patients:   StrictlyIdeas.no  Fact Sheet for Healthcare Providers: BankingDealers.co.za  This test is not yet approved or  cleared by the [place] FDA and has been authorized for detection and/or diagnosis of SARS-CoV-2 by FDA under an Emergency Use Authorization (EUA).  This EUA will remain  in effect (meaning this test can be used) for the duration of the COVID-19 declaration under Section 564(b)(1) of the Act, 21 U.S.C. section 360bbb-3(b)(1), unless the authorization is terminated or revoked sooner.  Performed at [facility], [place] [address], [place],  [place] [address]     Time coordinating discharge: Approximately 40 minutes  Annita Brod, MD  [facility] 11/23/2019, 11:29 AM

## 2019-11-23 NOTE — TOC Transition Note (Signed)
Transition of Care [facility]) - CM/SW Discharge Note   Patient Details  Name: [name] [name] MRN: [id number] Date of Birth: [birth year]  Transition of Care [facility]) CM/SW Contact:  Lia Hopping, [place] Phone Number: 11/23/2019, 12:33 PM   Clinical Narrative:    CSW confirm with staff Otila Kluver the facility is ready to accept the patient today. Patient notified at bedside. Nurse call report to: [phone] Room 124 EMS called to transport at 2:00pm  Final next level of care: Skilled Nursing Facility Barriers to Discharge: Barriers Resolved   Patient Goals and CMS Choice Patient states their goals for this hospitalization and ongoing recovery are:: To return to [facility] and continue with therapy. CMS Medicare.gov Compare Post Acute Care list provided to:: Patient Choice offered to / list presented to : Patient  Discharge Placement                       Discharge Plan and Services     Post Acute Care Choice: [place]            DME Agency: NA       HH Arranged: NA          Social Determinants of Health (SDOH) Interventions     Readmission Risk Interventions Readmission Risk Prevention Plan 11/08/2019  Transportation Screening Complete  Medication Review Press photographer) Referral to Pharmacy  PCP or Specialist appointment within 3-5 days of discharge Complete  HRI or Home Care Consult Complete  SW Recovery Care/Counseling Consult Complete  Palliative Care Screening Not [place] Complete  Some recent data might be hidden

## 2019-11-23 NOTE — Plan of Care (Signed)
  Problem: Health Behavior/Discharge Planning: Goal: Ability to manage health-related needs will improve Outcome: Progressing   Problem: Clinical Measurements: Goal: Ability to maintain clinical measurements within normal limits will improve Outcome: Progressing Goal: Will remain free from infection Outcome: Progressing Goal: Diagnostic test results will improve Outcome: Progressing   Problem: Activity: Goal: Risk for activity intolerance will decrease Outcome: Progressing   Problem: Nutrition: Goal: Adequate nutrition will be maintained Outcome: Progressing   Problem: Coping: Goal: Level of anxiety will decrease Outcome: Progressing   Problem: Elimination: Goal: Will not experience complications related to bowel motility Outcome: Progressing   Problem: Pain Managment: Goal: General experience of comfort will improve Outcome: Progressing   Problem: Safety: Goal: Ability to remain free from injury will improve Outcome: Progressing   Problem: Skin Integrity: Goal: Risk for impaired skin integrity will decrease Outcome: Progressing   

## 2019-11-23 NOTE — TOC Progression Note (Addendum)
Transition of Care [facility]) - Progression Note    Patient Details  Name: [name] [name] MRN: [id number] Date of Birth: 03-30-1955  Transition of Care [facility]) CM/SW [place], [place] Phone Number: 11/23/2019, 10:09 AM  Clinical Narrative:    CSW acknowledge note left in chart- the [facility] now has a bariatric bed for the patient.  CSW attempted to reach facility 3x so far this am. CSW will continue to call.   CSW confirm with staff Otila Kluver the facility is ready to accept the patient today. Patient notified at bedside. Nurse call report to: [phone] Room 124 PTAR to transport.    Expected Discharge Plan: [place] Barriers to Discharge: Continued Medical Work up, Ship broker  Expected Discharge Plan and Services Expected Discharge Plan: [place] Choice: [place] Living arrangements for the past 2 months: [place], [place] Expected Discharge Date: 11/21/19                 DME Agency: NA       HH Arranged: NA           Social Determinants of Health (SDOH) Interventions    Readmission Risk Interventions Readmission Risk Prevention Plan 11/08/2019  Transportation Screening Complete  Medication Review Press photographer) Referral to Pharmacy  PCP or Specialist appointment within 3-5 days of discharge Complete  HRI or [place] Complete  SW Recovery Care/Counseling Consult Complete  Palliative Care Screening Not [place] Complete  Some recent data might be hidden

## 2019-11-23 NOTE — Progress Notes (Signed)
  Echocardiogram 2D Echocardiogram has been performed.  [name] [name] 11/23/2019, 1:19 PM

## 2019-11-23 NOTE — Progress Notes (Addendum)
Updated d/c faxed to [facility] [phone]  EMS arranged for 2:00pm transport.

## 2019-11-27 DIAGNOSIS — R578 Other shock: Secondary | ICD-10-CM | POA: Diagnosis present

## 2020-02-03 ENCOUNTER — Other Ambulatory Visit: Payer: Self-pay

## 2020-02-03 ENCOUNTER — Emergency Department (HOSPITAL_COMMUNITY): Payer: No Typology Code available for payment source

## 2020-02-03 ENCOUNTER — Inpatient Hospital Stay (HOSPITAL_COMMUNITY)
Admission: EM | Admit: 2020-02-03 | Discharge: 2020-02-11 | DRG: 603 | Disposition: A | Discharge status: Home Health | Payer: No Typology Code available for payment source | Source: Skilled Nursing Facility | Attending: Family Medicine | Admitting: Family Medicine

## 2020-02-03 DIAGNOSIS — E1140 Type 2 diabetes mellitus with diabetic neuropathy, unspecified: Secondary | ICD-10-CM | POA: Diagnosis present

## 2020-02-03 DIAGNOSIS — L03119 Cellulitis of unspecified part of limb: Secondary | ICD-10-CM | POA: Diagnosis present

## 2020-02-03 DIAGNOSIS — Z885 Allergy status to narcotic agent status: Secondary | ICD-10-CM

## 2020-02-03 DIAGNOSIS — E860 Dehydration: Secondary | ICD-10-CM | POA: Diagnosis present

## 2020-02-03 DIAGNOSIS — Z880 Allergy status to penicillin: Secondary | ICD-10-CM

## 2020-02-03 DIAGNOSIS — N184 Chronic kidney disease, stage 4 (severe): Secondary | ICD-10-CM | POA: Diagnosis present

## 2020-02-03 DIAGNOSIS — E119 Type 2 diabetes mellitus without complications: Secondary | ICD-10-CM | POA: Diagnosis present

## 2020-02-03 DIAGNOSIS — Z79899 Other long term (current) drug therapy: Secondary | ICD-10-CM

## 2020-02-03 DIAGNOSIS — G8929 Other chronic pain: Secondary | ICD-10-CM | POA: Diagnosis present

## 2020-02-03 DIAGNOSIS — I130 Hypertensive heart and chronic kidney disease with heart failure and stage 1 through stage 4 chronic kidney disease, or unspecified chronic kidney disease: Secondary | ICD-10-CM | POA: Diagnosis present

## 2020-02-03 DIAGNOSIS — L03115 Cellulitis of right lower limb: Secondary | ICD-10-CM | POA: Diagnosis not present

## 2020-02-03 DIAGNOSIS — E1122 Type 2 diabetes mellitus with diabetic chronic kidney disease: Secondary | ICD-10-CM | POA: Diagnosis present

## 2020-02-03 DIAGNOSIS — Z20822 Contact with and (suspected) exposure to covid-19: Secondary | ICD-10-CM | POA: Diagnosis present

## 2020-02-03 DIAGNOSIS — I10 Essential (primary) hypertension: Secondary | ICD-10-CM | POA: Diagnosis present

## 2020-02-03 DIAGNOSIS — I5032 Chronic diastolic (congestive) heart failure: Secondary | ICD-10-CM | POA: Diagnosis present

## 2020-02-03 DIAGNOSIS — Z6841 Body Mass Index (BMI) 40.0 and over, adult: Secondary | ICD-10-CM

## 2020-02-03 DIAGNOSIS — N183 Chronic kidney disease, stage 3 unspecified: Secondary | ICD-10-CM | POA: Diagnosis present

## 2020-02-03 DIAGNOSIS — Z87891 Personal history of nicotine dependence: Secondary | ICD-10-CM

## 2020-02-03 DIAGNOSIS — L0390 Cellulitis, unspecified: Secondary | ICD-10-CM | POA: Diagnosis present

## 2020-02-03 DIAGNOSIS — N179 Acute kidney failure, unspecified: Secondary | ICD-10-CM | POA: Diagnosis present

## 2020-02-03 DIAGNOSIS — Z794 Long term (current) use of insulin: Secondary | ICD-10-CM | POA: Diagnosis present

## 2020-02-03 DIAGNOSIS — Z833 Family history of diabetes mellitus: Secondary | ICD-10-CM

## 2020-02-03 DIAGNOSIS — D638 Anemia in other chronic diseases classified elsewhere: Secondary | ICD-10-CM | POA: Diagnosis present

## 2020-02-03 MED ORDER — SODIUM CHLORIDE 0.9 % IV BOLUS
1000.0000 mL | Freq: Once | INTRAVENOUS | Status: AC
  Administered 2020-02-04: 03:00:00 1000 mL via INTRAVENOUS

## 2020-02-03 MED ORDER — VANCOMYCIN HCL 2000 MG/400ML IV SOLN
2000.0000 mg | Freq: Once | INTRAVENOUS | Status: DC
  Filled 2020-02-03: qty 400

## 2020-02-03 NOTE — ED Triage Notes (Signed)
Patient arrived by EMS from [facility]. Staff wanted patient to be sent out because right leg is very red and swollen, hot to touch, and painful.   Pulse is palpable  Pulse 74- resp 16- 100% 2L   Diabetic cbg: 182   Leg has been hurting for 3-4 days

## 2020-02-03 NOTE — Progress Notes (Addendum)
Pharmacy Antibiotic Note  [name] [name] is a 64 y.o. female admitted on 02/03/2020 with right leg red, swollen, hot to touch and painful.  Pharmacy has been consulted to dose vancomycin and merrem for cellulitis.  Plan: Vancomycin 2gm IV x 1 then 1750mg  q48h (AUC 493.9, Scr 2.37, wt 179.6kg) merrem 1gm IV q12h Follow renal function and clinical course     Temp (24hrs), Avg:97.7 F (36.5 C), Min:97.7 F (36.5 C), Max:97.7 F (36.5 C)  No results for input(s): WBC, CREATININE, LATICACIDVEN, VANCOTROUGH, VANCOPEAK, VANCORANDOM, GENTTROUGH, GENTPEAK, GENTRANDOM, TOBRATROUGH, TOBRAPEAK, TOBRARND, AMIKACINPEAK, AMIKACINTROU, AMIKACIN in the last 168 hours.  CrCl cannot be calculated (Patient's most recent lab result is older than the maximum 21 days allowed.).    Allergies  Allergen Reactions  . Morphine Itching and Rash  . Penicillins Itching, Nausea And Vomiting, Rash and Hives    Has patient had a PCN reaction causing immediate rash, facial/tongue/throat swelling, SOB or lightheadedness with hypotension: No Has patient had a PCN reaction causing severe rash involving mucus membranes or skin necrosis: No Has patient had a PCN reaction that required hospitalization: No Has patient had a PCN reaction occurring within the last 10 years: No If all of the above answers are "NO", then may proceed with Cephalosporin use.  Has patient had a PCN reaction causing immediate rash, facial/tongue/throat swelling, SOB or lightheadedness with hypotension: No Has patient had a PCN reaction causing severe rash involving mucus membranes or skin necrosis: No Has patient had a PCN reaction that required hospitalization: No Has patient had a PCN reaction occurring within the last 10 years: No If all of the above answers are "NO", then may proceed with Cephalosporin use.      Thank you for allowing pharmacy to be a part of this patient's care.  Dolly Rias RPh 02/03/2020, 10:53 PM
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
## 2020-02-03 NOTE — ED Notes (Addendum)
Patient was unable to tolerate BP cuff on her upper right arm. She said it was pinching her. RN moved it to her right forearm and I got a reading of 103/49. RN then suggested using lower left leg. The left leg gave a reading of 78/27. RN notified. RN stated right forearm was a more accurate reading and to enter that.

## 2020-02-04 ENCOUNTER — Inpatient Hospital Stay (HOSPITAL_COMMUNITY): Payer: No Typology Code available for payment source

## 2020-02-04 ENCOUNTER — Encounter (HOSPITAL_COMMUNITY): Payer: Self-pay | Admitting: Internal Medicine

## 2020-02-04 DIAGNOSIS — L03116 Cellulitis of left lower limb: Secondary | ICD-10-CM

## 2020-02-04 DIAGNOSIS — N1832 Chronic kidney disease, stage 3b: Secondary | ICD-10-CM | POA: Diagnosis not present

## 2020-02-04 DIAGNOSIS — Z885 Allergy status to narcotic agent status: Secondary | ICD-10-CM | POA: Diagnosis not present

## 2020-02-04 DIAGNOSIS — E1140 Type 2 diabetes mellitus with diabetic neuropathy, unspecified: Secondary | ICD-10-CM | POA: Diagnosis present

## 2020-02-04 DIAGNOSIS — I5032 Chronic diastolic (congestive) heart failure: Secondary | ICD-10-CM | POA: Diagnosis present

## 2020-02-04 DIAGNOSIS — E1122 Type 2 diabetes mellitus with diabetic chronic kidney disease: Secondary | ICD-10-CM

## 2020-02-04 DIAGNOSIS — N184 Chronic kidney disease, stage 4 (severe): Secondary | ICD-10-CM

## 2020-02-04 DIAGNOSIS — G8929 Other chronic pain: Secondary | ICD-10-CM | POA: Diagnosis present

## 2020-02-04 DIAGNOSIS — N171 Acute kidney failure with acute cortical necrosis: Secondary | ICD-10-CM

## 2020-02-04 DIAGNOSIS — I10 Essential (primary) hypertension: Secondary | ICD-10-CM

## 2020-02-04 DIAGNOSIS — Z833 Family history of diabetes mellitus: Secondary | ICD-10-CM | POA: Diagnosis not present

## 2020-02-04 DIAGNOSIS — N183 Chronic kidney disease, stage 3 unspecified: Secondary | ICD-10-CM

## 2020-02-04 DIAGNOSIS — R609 Edema, unspecified: Secondary | ICD-10-CM | POA: Diagnosis not present

## 2020-02-04 DIAGNOSIS — E860 Dehydration: Secondary | ICD-10-CM | POA: Diagnosis present

## 2020-02-04 DIAGNOSIS — L03119 Cellulitis of unspecified part of limb: Secondary | ICD-10-CM | POA: Diagnosis present

## 2020-02-04 DIAGNOSIS — Z20822 Contact with and (suspected) exposure to covid-19: Secondary | ICD-10-CM | POA: Diagnosis present

## 2020-02-04 DIAGNOSIS — Z880 Allergy status to penicillin: Secondary | ICD-10-CM | POA: Diagnosis not present

## 2020-02-04 DIAGNOSIS — Z79899 Other long term (current) drug therapy: Secondary | ICD-10-CM | POA: Diagnosis not present

## 2020-02-04 DIAGNOSIS — I130 Hypertensive heart and chronic kidney disease with heart failure and stage 1 through stage 4 chronic kidney disease, or unspecified chronic kidney disease: Secondary | ICD-10-CM | POA: Diagnosis present

## 2020-02-04 DIAGNOSIS — N179 Acute kidney failure, unspecified: Secondary | ICD-10-CM | POA: Diagnosis present

## 2020-02-04 DIAGNOSIS — Z6841 Body Mass Index (BMI) 40.0 and over, adult: Secondary | ICD-10-CM | POA: Diagnosis not present

## 2020-02-04 DIAGNOSIS — Z87891 Personal history of nicotine dependence: Secondary | ICD-10-CM | POA: Diagnosis not present

## 2020-02-04 DIAGNOSIS — L03115 Cellulitis of right lower limb: Secondary | ICD-10-CM | POA: Diagnosis not present

## 2020-02-04 DIAGNOSIS — D638 Anemia in other chronic diseases classified elsewhere: Secondary | ICD-10-CM | POA: Diagnosis present

## 2020-02-04 LAB — CBC
HCT: 37.2 % (ref 36.0–46.0)
Hemoglobin: 11.4 g/dL — ABNORMAL LOW (ref 12.0–15.0)
MCH: 28.2 pg (ref 26.0–34.0)
MCHC: 30.6 g/dL (ref 30.0–36.0)
MCV: 92.1 fL (ref 80.0–100.0)
Platelets: 455 10*3/uL — ABNORMAL HIGH (ref 150–400)
RBC: 4.04 MIL/uL (ref 3.87–5.11)
RDW: 15.6 % — ABNORMAL HIGH (ref 11.5–15.5)
WBC: 16.5 10*3/uL — ABNORMAL HIGH (ref 4.0–10.5)
nRBC: 0 % (ref 0.0–0.2)

## 2020-02-04 LAB — BASIC METABOLIC PANEL
Anion gap: 13 (ref 5–15)
BUN: 61 mg/dL — ABNORMAL HIGH (ref 8–23)
CO2: 28 mmol/L (ref 22–32)
Calcium: 9.4 mg/dL (ref 8.9–10.3)
Chloride: 98 mmol/L (ref 98–111)
Creatinine, Ser: 2.19 mg/dL — ABNORMAL HIGH (ref 0.44–1.00)
GFR, Estimated: 24 mL/min — ABNORMAL LOW (ref >60)
Glucose, Bld: 88 mg/dL (ref 70–99)
Potassium: 4.5 mmol/L (ref 3.5–5.1)
Sodium: 139 mmol/L (ref 135–145)

## 2020-02-04 LAB — CBC WITH DIFFERENTIAL/PLATELET
Abs Immature Granulocytes: 1.04 10*3/uL — ABNORMAL HIGH (ref 0.00–0.07)
Basophils Absolute: 0.1 10*3/uL (ref 0.0–0.1)
Basophils Relative: 0 %
Eosinophils Absolute: 0.3 10*3/uL (ref 0.0–0.5)
Eosinophils Relative: 2 %
HCT: 35.2 % — ABNORMAL LOW (ref 36.0–46.0)
Hemoglobin: 10.8 g/dL — ABNORMAL LOW (ref 12.0–15.0)
Immature Granulocytes: 5 %
Lymphocytes Relative: 9 %
Lymphs Abs: 1.8 10*3/uL (ref 0.7–4.0)
MCH: 27.9 pg (ref 26.0–34.0)
MCHC: 30.7 g/dL (ref 30.0–36.0)
MCV: 91 fL (ref 80.0–100.0)
Monocytes Absolute: 1 10*3/uL (ref 0.1–1.0)
Monocytes Relative: 5 %
Neutro Abs: 15.8 10*3/uL — ABNORMAL HIGH (ref 1.7–7.7)
Neutrophils Relative %: 79 %
Platelets: 472 10*3/uL — ABNORMAL HIGH (ref 150–400)
RBC: 3.87 MIL/uL (ref 3.87–5.11)
RDW: 15.2 % (ref 11.5–15.5)
WBC: 20 10*3/uL — ABNORMAL HIGH (ref 4.0–10.5)
nRBC: 0.1 % (ref 0.0–0.2)

## 2020-02-04 LAB — COMPREHENSIVE METABOLIC PANEL
ALT: 10 U/L (ref 0–44)
AST: 13 U/L — ABNORMAL LOW (ref 15–41)
Albumin: 3.2 g/dL — ABNORMAL LOW (ref 3.5–5.0)
Alkaline Phosphatase: 118 U/L (ref 38–126)
Anion gap: 12 (ref 5–15)
BUN: 62 mg/dL — ABNORMAL HIGH (ref 8–23)
CO2: 27 mmol/L (ref 22–32)
Calcium: 9.9 mg/dL (ref 8.9–10.3)
Chloride: 99 mmol/L (ref 98–111)
Creatinine, Ser: 2.37 mg/dL — ABNORMAL HIGH (ref 0.44–1.00)
GFR, Estimated: 22 mL/min — ABNORMAL LOW (ref >60)
Glucose, Bld: 118 mg/dL — ABNORMAL HIGH (ref 70–99)
Potassium: 4.4 mmol/L (ref 3.5–5.1)
Sodium: 138 mmol/L (ref 135–145)
Total Bilirubin: 0.4 mg/dL (ref 0.3–1.2)
Total Protein: 8.1 g/dL (ref 6.5–8.1)

## 2020-02-04 LAB — BLOOD GAS, ARTERIAL
Acid-Base Excess: 0.6 mmol/L (ref 0.0–2.0)
Bicarbonate: 26.3 mmol/L (ref 20.0–28.0)
O2 Saturation: 96.9 %
Patient temperature: 37
pCO2 arterial: 50.6 mmHg — ABNORMAL HIGH (ref 32.0–48.0)
pH, Arterial: 7.335 — ABNORMAL LOW (ref 7.350–7.450)
pO2, Arterial: 90.7 mmHg (ref 83.0–108.0)

## 2020-02-04 LAB — GLUCOSE, CAPILLARY
Glucose-Capillary: 82 mg/dL (ref 70–99)
Glucose-Capillary: 145 mg/dL — ABNORMAL HIGH (ref 70–99)
Glucose-Capillary: 116 mg/dL — ABNORMAL HIGH (ref 70–99)
Glucose-Capillary: 114 mg/dL — ABNORMAL HIGH (ref 70–99)

## 2020-02-04 LAB — APTT: aPTT: 33 seconds (ref 24–36)

## 2020-02-04 LAB — PROTIME-INR
INR: 1 (ref 0.8–1.2)
Prothrombin Time: 13.2 seconds (ref 11.4–15.2)

## 2020-02-04 LAB — RESP PANEL BY RT-PCR (FLU A&B, COVID) ARPGX2
Influenza A by PCR: NEGATIVE
Influenza B by PCR: NEGATIVE
SARS Coronavirus 2 by RT PCR: NEGATIVE

## 2020-02-04 LAB — HEMOGLOBIN A1C
Hgb A1c MFr Bld: 6.8 % — ABNORMAL HIGH (ref 4.8–5.6)
Mean Plasma Glucose: 148.46 mg/dL

## 2020-02-04 LAB — LACTIC ACID, PLASMA
Lactic Acid, Venous: 0.9 mmol/L (ref 0.5–1.9)
Lactic Acid, Venous: 1.4 mmol/L (ref 0.5–1.9)

## 2020-02-04 MED ORDER — ZOLPIDEM TARTRATE 5 MG PO TABS
5.0000 mg | ORAL_TABLET | Freq: Every day | ORAL | Status: DC
  Administered 2020-02-05 – 2020-02-10 (×6): 5 mg via ORAL
  Filled 2020-02-04 (×7): qty 1

## 2020-02-04 MED ORDER — FAMOTIDINE 20 MG PO TABS
20.0000 mg | ORAL_TABLET | Freq: Every day | ORAL | Status: DC
  Administered 2020-02-04 – 2020-02-11 (×7): 20 mg via ORAL
  Filled 2020-02-04 (×8): qty 1

## 2020-02-04 MED ORDER — VANCOMYCIN HCL 1750 MG/350ML IV SOLN: 1750.0000 mg | INTRAVENOUS | Status: DC

## 2020-02-04 MED ORDER — VANCOMYCIN HCL 2000 MG/400ML IV SOLN
2000.0000 mg | Freq: Once | INTRAVENOUS | Status: AC
  Administered 2020-02-04: 03:00:00 2000 mg via INTRAVENOUS
  Filled 2020-02-04: qty 400

## 2020-02-04 MED ORDER — METRONIDAZOLE 500 MG PO TABS
500.0000 mg | ORAL_TABLET | Freq: Three times a day (TID) | ORAL | Status: DC
  Administered 2020-02-04 – 2020-02-05 (×4): 500 mg via ORAL
  Filled 2020-02-04 (×4): qty 1

## 2020-02-04 MED ORDER — INSULIN ASPART 100 UNIT/ML ~~LOC~~ SOLN
0.0000 [IU] | Freq: Three times a day (TID) | SUBCUTANEOUS | Status: DC
  Administered 2020-02-04 – 2020-02-05 (×3): 1 [IU] via SUBCUTANEOUS

## 2020-02-04 MED ORDER — ATORVASTATIN CALCIUM 10 MG PO TABS
10.0000 mg | ORAL_TABLET | Freq: Every day | ORAL | Status: DC
  Administered 2020-02-05 – 2020-02-11 (×6): 10 mg via ORAL
  Filled 2020-02-04 (×7): qty 1

## 2020-02-04 MED ORDER — PANTOPRAZOLE SODIUM 40 MG PO TBEC
40.0000 mg | DELAYED_RELEASE_TABLET | Freq: Two times a day (BID) | ORAL | Status: DC
  Administered 2020-02-04 – 2020-02-11 (×14): 40 mg via ORAL
  Filled 2020-02-04 (×15): qty 1

## 2020-02-04 MED ORDER — ENOXAPARIN SODIUM 40 MG/0.4ML ~~LOC~~ SOLN
40.0000 mg | SUBCUTANEOUS | Status: DC
  Administered 2020-02-04 – 2020-02-11 (×7): 40 mg via SUBCUTANEOUS
  Filled 2020-02-04 (×8): qty 0.4

## 2020-02-04 MED ORDER — ACETAMINOPHEN 325 MG PO TABS
650.0000 mg | ORAL_TABLET | Freq: Four times a day (QID) | ORAL | Status: DC | PRN
  Administered 2020-02-04 – 2020-02-11 (×6): 650 mg via ORAL
  Filled 2020-02-04 (×6): qty 2

## 2020-02-04 MED ORDER — FUROSEMIDE 20 MG PO TABS: 20.0000 mg | ORAL_TABLET | Freq: Two times a day (BID) | ORAL | Status: DC

## 2020-02-04 MED ORDER — SODIUM CHLORIDE 0.9 % IV SOLN
1.0000 g | Freq: Two times a day (BID) | INTRAVENOUS | Status: DC
  Administered 2020-02-04: 06:00:00 1 g via INTRAVENOUS
  Filled 2020-02-04: qty 1

## 2020-02-04 MED ORDER — HYDROXYZINE HCL 25 MG PO TABS: 50.0000 mg | ORAL_TABLET | Freq: Three times a day (TID) | ORAL | Status: DC | PRN

## 2020-02-04 MED ORDER — ALPRAZOLAM 0.5 MG PO TABS: 0.5000 mg | ORAL_TABLET | Freq: Three times a day (TID) | ORAL | Status: DC | PRN

## 2020-02-04 MED ORDER — SODIUM CHLORIDE 0.9 % IV SOLN: INTRAVENOUS | Status: DC

## 2020-02-04 MED ORDER — CHLORTHALIDONE 25 MG PO TABS: 25.0000 mg | ORAL_TABLET | Freq: Every day | ORAL | Status: DC

## 2020-02-04 MED ORDER — ATENOLOL 50 MG PO TABS: 50.0000 mg | ORAL_TABLET | Freq: Every day | ORAL | Status: DC

## 2020-02-04 MED ORDER — VANCOMYCIN HCL IN DEXTROSE 1-5 GM/200ML-% IV SOLN
1000.0000 mg | Freq: Once | INTRAVENOUS | Status: DC
  Filled 2020-02-04: qty 200

## 2020-02-04 MED ORDER — SODIUM CHLORIDE 0.9 % IV SOLN
2.0000 g | INTRAVENOUS | Status: DC
  Administered 2020-02-04 – 2020-02-05 (×2): 2 g via INTRAVENOUS
  Filled 2020-02-04: qty 20
  Filled 2020-02-04: qty 2

## 2020-02-04 MED ORDER — LABETALOL HCL 5 MG/ML IV SOLN: 10.0000 mg | INTRAVENOUS | Status: DC | PRN

## 2020-02-04 MED ORDER — ALLOPURINOL 100 MG PO TABS
100.0000 mg | ORAL_TABLET | Freq: Two times a day (BID) | ORAL | Status: DC
  Administered 2020-02-04 – 2020-02-11 (×14): 100 mg via ORAL
  Filled 2020-02-04 (×17): qty 1

## 2020-02-04 MED ORDER — SODIUM CHLORIDE 0.9 % IV SOLN
1.0000 g | Freq: Once | INTRAVENOUS | Status: DC
  Filled 2020-02-04: qty 1

## 2020-02-04 MED ORDER — TRAZODONE HCL 50 MG PO TABS: 50.0000 mg | ORAL_TABLET | Freq: Every evening | ORAL | Status: DC | PRN

## 2020-02-04 MED ORDER — VANCOMYCIN HCL 1500 MG/300ML IV SOLN: 1500.0000 mg | INTRAVENOUS | Status: DC

## 2020-02-04 MED ORDER — ATENOLOL-CHLORTHALIDONE 50-25 MG PO TABS: 1.0000 | ORAL_TABLET | Freq: Every day | ORAL | Status: DC

## 2020-02-04 MED ORDER — DULOXETINE HCL 30 MG PO CPEP
30.0000 mg | ORAL_CAPSULE | Freq: Two times a day (BID) | ORAL | Status: DC
  Administered 2020-02-04 – 2020-02-07 (×6): 30 mg via ORAL
  Filled 2020-02-04 (×14): qty 1

## 2020-02-04 MED ORDER — SODIUM CHLORIDE 0.9 % IV SOLN
1.0000 g | Freq: Three times a day (TID) | INTRAVENOUS | Status: DC
  Filled 2020-02-04: qty 1

## 2020-02-04 MED ORDER — FAMOTIDINE 20 MG PO TABS: 20.0000 mg | ORAL_TABLET | Freq: Two times a day (BID) | ORAL | Status: DC

## 2020-02-04 MED ORDER — GABAPENTIN 300 MG PO CAPS
600.0000 mg | ORAL_CAPSULE | Freq: Two times a day (BID) | ORAL | Status: DC
  Administered 2020-02-04 – 2020-02-11 (×14): 600 mg via ORAL
  Filled 2020-02-04 (×15): qty 2

## 2020-02-04 MED ORDER — HYDROCODONE-ACETAMINOPHEN 5-325 MG PO TABS
1.0000 | ORAL_TABLET | Freq: Three times a day (TID) | ORAL | Status: DC | PRN
  Administered 2020-02-06: 1 via ORAL
  Filled 2020-02-04: qty 1

## 2020-02-04 MED ORDER — ACETAMINOPHEN 650 MG RE SUPP: 650.0000 mg | Freq: Four times a day (QID) | RECTAL | Status: DC | PRN

## 2020-02-04 MED ORDER — BUSPIRONE HCL 5 MG PO TABS
5.0000 mg | ORAL_TABLET | Freq: Three times a day (TID) | ORAL | Status: DC
  Administered 2020-02-04 – 2020-02-11 (×20): 5 mg via ORAL
  Filled 2020-02-04 (×24): qty 1

## 2020-02-04 NOTE — Progress Notes (Signed)
Pharmacy Antibiotic Note  [name] [name] is a 64 y.o. female admitted on 02/03/2020 with right leg red, swollen, hot to touch and painful.  Pharmacy has been consulted to dose vancomycin for cellulitis.  Meropenem has been changed to ceftriaxone and flagyl per MD.  Plan: Vancomycin 2gm IV x 1 then 1500 mg IV q48h  Ceftriaxone 2g IV q24h  Flagyl 500 mg PO Q8h Follow renal function and clinical course  Height: 5\' 2"  (157.5 cm) Weight: (!) 142 kg (313 lb 0.9 oz) IBW/kg (Calculated) : 50.1  Temp (24hrs), Avg:97.8 F (36.6 C), Min:97.7 F (36.5 C), Max:98 F (36.7 C)  Recent Labs  Lab 02/03/20 2351 02/04/20 0654  WBC 20.0* 16.5*  CREATININE 2.37* 2.19*  LATICACIDVEN 0.9 1.4    Estimated Creatinine Clearance: 34.7 mL/min (A) (by C-G formula based on SCr of 2.19 mg/dL (H)).    Allergies  Allergen Reactions  . Morphine Itching and Rash  . Penicillins Itching, Nausea And Vomiting, Rash and Hives    Has patient had a PCN reaction causing immediate rash, facial/tongue/throat swelling, SOB or lightheadedness with hypotension: No Has patient had a PCN reaction causing severe rash involving mucus membranes or skin necrosis: No Has patient had a PCN reaction that required hospitalization: No Has patient had a PCN reaction occurring within the last 10 years: No If all of the above answers are "NO", then may proceed with Cephalosporin use.  Has patient had a PCN reaction causing immediate rash, facial/tongue/throat swelling, SOB or lightheadedness with hypotension: No Has patient had a PCN reaction causing severe rash involving mucus membranes or skin necrosis: No Has patient had a PCN reaction that required hospitalization: No Has patient had a PCN reaction occurring within the last 10 years: No If all of the above answers are "NO", then may proceed with Cephalosporin use.     Antimicrobials this admission:  12/14 Vancomycin >> 12/14 Meropenem >> 12/14 12/14 Ceftriaxone  >> 12/14 Flagyl >>   Dose adjustments this admission:  12/14 Vancomycin dose adjusted for updated weight, SCr  Microbiology results:  12/13 Covid neg; Influenza A/B neg 12/13 BCx: 12/13 UCx:   Thank you for allowing pharmacy to be a part of this patient's care.  Gretta Arab PharmD, BCPS Clinical Pharmacist [facility] main pharmacy [phone] 02/04/2020 8:26 AM

## 2020-02-04 NOTE — Consult Note (Signed)
I have placed a request via Secure Chat to Dr. Sherral Hammers requesting photos of the wound areas of concern to be placed in the EMR.  No WOC nurse available for in person consultation at this time. Will need to complete telehealth consultation.  [place], [place], [place]

## 2020-02-04 NOTE — Plan of Care (Signed)

## 2020-02-04 NOTE — Progress Notes (Signed)
Bilateral lower extremity venous duplex has been completed. Preliminary results can be found in CV Proc through chart review.   02/04/20 9:26 AM Carlos Levering RVT

## 2020-02-04 NOTE — Progress Notes (Signed)
RT entered the pt's and she was talking and joking around with RT. RT asked the nurse to call the doctor to ask if he still wanted an ABG. Dr. Sherral Hammers stated that he still wanted the ABG. RT drew the ABG.
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
## 2020-02-04 NOTE — Progress Notes (Signed)
PROGRESS NOTE    [name] [name]  JYN:[id number] DOB: 1955/09/09 DOA: 02/03/2020 PCP: [name], MD     Brief Narrative:  [name] [name] is a 64 y.o. BF PMHx DM type 2 uncontrolled with complication, DM neuropathy, HTN,, chronic diastolic CHF, anemia, morbid obesity recently admitted 2 months ago for hemorrhagic shock underwent embolization of the artery source was upper GI bleed has been experiencing increasing swelling and redness of the right lower extremity last few days.  Denies any trauma fall or insect bites.  ED Course: In the ER patient was having low normal blood pressure with labs showing WBC count of 20,000.  Creatinine has increased from baseline of 1.4 it is around 2.3.  Patient was given fluid bolus.  Blood cultures obtained started on empiric antibiotics for cellulitis with possible orthopedic sepsis.  Covid test was negative.  X-rays of the right lower extremity does not show any signs of osteomyelitis.  On exam patient has good sensation of the lower extremities and is able to move without no definite signs of any compartment syndrome.    Subjective: Afebrile overnight Sleepy, somewhat difficult to arouse.  Once aroused A/O x4.  States not on home O2, not on CPAP,But will then turn around and state is on oxygen at home.   Assessment & Plan: Covid vaccination; vaccinated   Principal Problem:   Lower extremity cellulitis Active Problems:   Type 2 diabetes mellitus with stage 3 chronic kidney disease (HCC)   CKD (chronic kidney disease), stage III ([place])   Cellulitis   Essential hypertension   Morbid obesity ([place])  Sepsis  -Patient DOES NOT MEET criteria for sepsis on admission.!  RIGHT lower extremity cellulitis -Complete 7-day course antibiotics -X-ray did not show any sign of osteomyelitis, but given patient's body habitus if further suspicion might need to obtain MRI. -Follow lactic acid level Results for ARLINDA, BARCELONA (MRN [id number])  as of 02/04/2020 17:17  Ref. Range 02/03/2020 23:51 02/04/2020 06:54  Lactic Acid, Venous Latest Ref Range: 0.5 - 1.9 mmol/L 0.9 1.4  -Bilateral lower extremity Doppler negative DVT  DM type II controlled with complication/DM neuropathy -12/14 hemoglobin A1c= 6.8 -Sensitive SSI  Acute on CKD stage III (baseline Cr 1.42) -Likely multifactorial infection, diabetes, hypertension, -Hold all renal toxic medication Lab Results  Component Value Date   CREATININE 2.19 (H) 02/04/2020   CREATININE 2.37 (H) 02/03/2020   CREATININE 1.42 (H) 11/23/2019   CREATININE 1.36 (H) 11/22/2019   CREATININE 2.04 (H) 11/19/2019    Chronic diastolic CHF -Strict in and out -Daily weight -Normal saline 69ml/hr -Labetalol PRN -  Essential HTN -See CHF  Morbid obesity -Patient would benefit from weight loss program  Anemia unspecified -Recent history hemorrhagic shock from upper GI bleed requiring embolization and multiple blood transfusions (8 units) -Anemia panel pending      DVT prophylaxis: Lovenox Code Status: Full Family Communication:  Status is: Inpatient    Dispo: The patient is from: Home              Anticipated d/c is to: SNF?              Anticipated d/c date is: 12/18              Patient currently unstable      Consultants:    Procedures/Significant Events:  12/14 bilateral lower extremity Doppler; negative DVT    I have personally reviewed and interpreted all radiology studies and my findings are as above.  VENTILATOR SETTINGS:    Cultures   Antimicrobials: Anti-infectives (From admission, onward)   Start     Ordered Stop   02/06/20 0600  vancomycin (VANCOREADY) IVPB 1500 mg/300 mL        02/04/20 0906     02/06/20 0400  vancomycin (VANCOREADY) IVPB 1750 mg/350 mL  Status:  Discontinued        02/04/20 0323 02/04/20 0906   02/04/20 1400  cefTRIAXone (ROCEPHIN) 2 g in sodium chloride 0.9 % 100 mL IVPB        02/04/20 0906     02/04/20 1400   metroNIDAZOLE (FLAGYL) tablet 500 mg        02/04/20 0906     02/04/20 0515  meropenem (MERREM) 1 g in sodium chloride 0.9 % 100 mL IVPB  Status:  Discontinued        02/04/20 0429 02/04/20 0906   02/04/20 0415  meropenem (MERREM) 1 g in sodium chloride 0.9 % 100 mL IVPB  Status:  Discontinued        02/04/20 0323 02/04/20 0429   02/04/20 0400  vancomycin (VANCOCIN) IVPB 1000 mg/200 mL premix  Status:  Discontinued        02/04/20 0308 02/04/20 0318   02/04/20 0400  meropenem (MERREM) 1 g in sodium chloride 0.9 % 100 mL IVPB  Status:  Discontinued        02/04/20 0308 02/04/20 0323   02/04/20 0400  vancomycin (VANCOREADY) IVPB 2000 mg/400 mL        02/04/20 0318 02/04/20 0521   02/03/20 2300  vancomycin (VANCOREADY) IVPB 2000 mg/400 mL  Status:  Discontinued        02/03/20 2248 02/04/20 0312       Devices    LINES / TUBES:      Continuous Infusions: . meropenem (MERREM) IV 1 g (02/04/20 9678)  . [START ON 02/06/2020] vancomycin       Objective: Vitals:   02/04/20 0226 02/04/20 0315 02/04/20 0340 02/04/20 0616  BP: 108/78 (!) 100/59  (!) 106/47  Pulse: 67 68  68  Resp: 19 18  16   Temp:  98 F (36.7 C)  97.8 F (36.6 C)  TempSrc:  Oral  Oral  SpO2: 100% 96%  100%  Weight:   (!) 142 kg   Height:   5\' 2"  (1.575 m)     Intake/Output Summary (Last 24 hours) at 02/04/2020 0827 Last data filed at 02/04/2020 9381 Gross per 24 hour  Intake 400 ml  Output --  Net 400 ml   Filed Weights   02/04/20 0340  Weight: (!) 142 kg    Examination:  General: Somnolent, difficult to arouse, No acute respiratory distress Eyes: negative scleral hemorrhage, negative anisocoria, negative icterus ENT: Negative Runny nose, negative gingival bleeding, Neck:  Negative scars, masses, torticollis, lymphadenopathy, JVD Lungs: decreased breath sounds bilaterally (may just be secondary to her body habitus), without wheezes or crackles Cardiovascular: Regular rate and rhythm without  murmur gallop or rub normal S1 and S2 Abdomen: MORBIDLY OBESE, negative abdominal pain, nondistended, positive soft, bowel sounds, no rebound, no ascites, no appreciable mass Extremities: RIGHT lower extremity warm to the touch, erythematous Skin: Negative rashes, lesions, ulcers Psychiatric: Unable to evaluate secondary to patient's somnolence Central nervous system: Unable to fully evaluate secondary to patient's somnolence.  Follows some commands .     Data Reviewed: Care during the described time interval was provided by me .  I have reviewed this patient's available data, including  medical history, events of note, physical examination, and all test results as part of my evaluation.  CBC: Recent Labs  Lab 02/03/20 2351 02/04/20 0654  WBC 20.0* 16.5*  NEUTROABS 15.8*  --   HGB 10.8* 11.4*  HCT 35.2* 37.2  MCV 91.0 92.1  PLT 472* 856*   Basic Metabolic Panel: Recent Labs  Lab 02/03/20 2351 02/04/20 0654  NA 138 139  K 4.4 4.5  CL 99 98  CO2 27 28  GLUCOSE 118* 88  BUN 62* 61*  CREATININE 2.37* 2.19*  CALCIUM 9.9 9.4   GFR: Estimated Creatinine Clearance: 34.7 mL/min (A) (by C-G formula based on SCr of 2.19 mg/dL (H)). Liver Function Tests: Recent Labs  Lab 02/03/20 2351  AST 13*  ALT 10  ALKPHOS 118  BILITOT 0.4  PROT 8.1  ALBUMIN 3.2*   No results for input(s): LIPASE, AMYLASE in the last 168 hours. No results for input(s): AMMONIA in the last 168 hours. Coagulation Profile: Recent Labs  Lab 02/03/20 2351  INR 1.0   Cardiac Enzymes: No results for input(s): CKTOTAL, CKMB, CKMBINDEX, TROPONINI in the last 168 hours. BNP (last 3 results) No results for input(s): PROBNP in the last 8760 hours. HbA1C: No results for input(s): HGBA1C in the last 72 hours. CBG: Recent Labs  Lab 02/04/20 0720  GLUCAP 82   Lipid Profile: No results for input(s): CHOL, HDL, LDLCALC, TRIG, CHOLHDL, LDLDIRECT in the last 72 hours. Thyroid Function Tests: No results  for input(s): TSH, T4TOTAL, FREET4, T3FREE, THYROIDAB in the last 72 hours. Anemia Panel: No results for input(s): VITAMINB12, FOLATE, FERRITIN, TIBC, IRON, RETICCTPCT in the last 72 hours. Sepsis Labs: Recent Labs  Lab 02/03/20 2351 02/04/20 0654  LATICACIDVEN 0.9 1.4    Recent Results (from the past 240 hour(s))  Resp Panel by RT-PCR (Flu A&B, Covid) Nasopharyngeal Swab     Status: None   Collection Time: 02/03/20 11:51 PM   Specimen: Nasopharyngeal Swab; Nasopharyngeal(NP) swabs in vial transport medium  Result Value Ref Range Status   SARS Coronavirus 2 by RT PCR NEGATIVE NEGATIVE Final    Comment: (NOTE) SARS-CoV-2 target nucleic acids are NOT DETECTED.  The SARS-CoV-2 RNA is generally detectable in upper respiratory specimens during the acute phase of infection. The lowest concentration of SARS-CoV-2 viral copies this assay can detect is 138 copies/mL. A negative result does not preclude SARS-Cov-2 infection and should not be used as the sole basis for treatment or other patient management decisions. A negative result may occur with  improper specimen collection/handling, submission of specimen other than nasopharyngeal swab, presence of viral mutation(s) within the areas targeted by this assay, and inadequate number of viral copies(<138 copies/mL). A negative result must be combined with clinical observations, patient history, and epidemiological information. The expected result is Negative.  Fact Sheet for Patients:  EntrepreneurPulse.com.au  Fact Sheet for Healthcare Providers:  IncredibleEmployment.be  This test is no t yet approved or cleared by the [place] FDA and  has been authorized for detection and/or diagnosis of SARS-CoV-2 by FDA under an Emergency Use Authorization (EUA). This EUA will remain  in effect (meaning this test can be used) for the duration of the COVID-19 declaration under Section 564(b)(1) of the Act,  21 U.S.C.section 360bbb-3(b)(1), unless the authorization is terminated  or revoked sooner.       Influenza A by PCR NEGATIVE NEGATIVE Final   Influenza B by PCR NEGATIVE NEGATIVE Final    Comment: (NOTE) The Xpert Xpress SARS-CoV-2/FLU/RSV plus assay  is intended as an aid in the diagnosis of influenza from Nasopharyngeal swab specimens and should not be used as a sole basis for treatment. Nasal washings and aspirates are unacceptable for Xpert Xpress SARS-CoV-2/FLU/RSV testing.  Fact Sheet for Patients: EntrepreneurPulse.com.au  Fact Sheet for Healthcare Providers: IncredibleEmployment.be  This test is not yet approved or cleared by the [place] FDA and has been authorized for detection and/or diagnosis of SARS-CoV-2 by FDA under an Emergency Use Authorization (EUA). This EUA will remain in effect (meaning this test can be used) for the duration of the COVID-19 declaration under Section 564(b)(1) of the Act, 21 U.S.C. section 360bbb-3(b)(1), unless the authorization is terminated or revoked.  Performed at [facility], [place] [address], [place], [place] [address]          Radiology Studies: DG Tibia/Fibula Right  Result Date: 02/03/2020 CLINICAL DATA:  Cellulitis. Right leg swelling and redness. Pain EXAM: RIGHT TIBIA AND FIBULA - 2 VIEW COMPARISON:  None. FINDINGS: The cortical margins of the tibia and fibula are intact. No bony destruction or periosteal reaction. No fracture. Moderate osteoarthritis of the knee, with fragmented and elongated quadriceps tendon enthesophyte. Ankle alignment is maintained. Soft tissue edema is noted most prominent laterally. No soft tissue air or radiopaque foreign body. Occasional soft tissue phleboliths. IMPRESSION: 1. Soft tissue edema most prominent laterally. No soft tissue air or radiopaque foreign body. 2. No evidence of osteomyelitis or acute osseous abnormality. Moderate  osteoarthritis of the knee. Electronically Signed   By: Keith Rake M.D.   On: 02/03/2020 21:28   DG Femur Min 2 Views Right  Result Date: 02/03/2020 CLINICAL DATA:  Cellulitis.  Right leg swelling and redness. Pain. EXAM: RIGHT FEMUR 2 VIEWS COMPARISON:  None. FINDINGS: Cortical margins of the femur are intact. No bony destruction or fracture. There is no evidence of focal bone lesion. Hip joint space is grossly maintained, detailed assessment limited by soft tissue attenuation from habitus. Elongated and fragmented quadriceps tendon enthesophyte. There are vascular calcifications. No soft tissue air. Portions of the soft tissues are excluded from the field of view due to habitus. IMPRESSION: 1. No evidence of osteomyelitis. 2. Elongated and fragmented quadriceps tendon enthesophyte. 3. Vascular calcifications. No air in the included soft tissues, which are only partially included due to habitus. Electronically Signed   By: Keith Rake M.D.   On: 02/03/2020 21:26        Scheduled Meds: . allopurinol  100 mg Oral BID  . atorvastatin  10 mg Oral Daily  . busPIRone  5 mg Oral Q8H  . DULoxetine  30 mg Oral BID  . enoxaparin (LOVENOX) injection  40 mg Subcutaneous Q24H  . famotidine  20 mg Oral Daily  . gabapentin  600 mg Oral BID  . insulin aspart  0-9 Units Subcutaneous TID WC  . pantoprazole  40 mg Oral BID  . zolpidem  5 mg Oral QHS   Continuous Infusions: . meropenem (MERREM) IV 1 g (02/04/20 9169)  . [START ON 02/06/2020] vancomycin       LOS: 0 days    Time spent:40 min    Chelsi Warr, Geraldo Docker, MD [facility] Pager [phone]  If 7PM-7AM, please contact night-coverage [email or web address] Password [facility] 02/04/2020, 8:27 AM

## 2020-02-04 NOTE — ED Notes (Addendum)
This patient now has a bed but no IV access. MD put in a ultra sound IV and it blew after we got the blood work. Assigned nurse tried and my charge nurse tired. IV team was consulted and she couldn't get access either. MD is aware of no access.

## 2020-02-04 NOTE — Plan of Care (Signed)
Care plan initiated and discussed with the patient. 

## 2020-02-04 NOTE — TOC Progression Note (Signed)
Transition of Care [facility]) - Progression Note    Patient Details  Name: [name] [name] MRN: [id number] Date of Birth: [birth year]  Transition of Care [facility]) CM/SW [place], [place] Phone Number: 02/04/2020, 2:06 PM  Clinical Narrative:    Patient arrived from [facility].  CSW attempted to see the patient, she is resting at this time. CSW will attempt to see the patient at a different time.    Expected Discharge Plan: Skilled Nursing Facility Barriers to Discharge: Continued Medical Work up  Expected Discharge Plan and Services Expected Discharge Plan: [place]                                               Social Determinants of Health (SDOH) Interventions    Readmission Risk Interventions Readmission Risk Prevention Plan 11/08/2019  Transportation Screening Complete  Medication Review Press photographer) Referral to Pharmacy  PCP or Specialist appointment within 3-5 days of discharge Complete  HRI or Home Care Consult Complete  SW Recovery Care/Counseling Consult Complete  Palliative Care Screening Not [place] Complete  Some recent data might be hidden

## 2020-02-04 NOTE — ED Provider Notes (Signed)
Care assumed from Dr. Regenia Skeeter at shift change.  Patient awaiting results of laboratory studies.  She was sent here from her extended care facility for evaluation of fever and redness/pain to the right leg.  Patient has what appears to be a cellulitis of the right thigh.  White count is 20,000.  Blood cultures have been obtained.  Remainder of laboratory studies otherwise unremarkable.  Patient will be given IV vancomycin and admitted to the hospitalist service.   Veryl Speak, MD 02/04/20 [phone]

## 2020-02-05 DIAGNOSIS — L03115 Cellulitis of right lower limb: Principal | ICD-10-CM

## 2020-02-05 LAB — COMPREHENSIVE METABOLIC PANEL
ALT: 9 U/L (ref 0–44)
AST: 10 U/L — ABNORMAL LOW (ref 15–41)
Albumin: 2.7 g/dL — ABNORMAL LOW (ref 3.5–5.0)
Alkaline Phosphatase: 96 U/L (ref 38–126)
Anion gap: 10 (ref 5–15)
BUN: 60 mg/dL — ABNORMAL HIGH (ref 8–23)
CO2: 27 mmol/L (ref 22–32)
Calcium: 9.3 mg/dL (ref 8.9–10.3)
Chloride: 103 mmol/L (ref 98–111)
Creatinine, Ser: 1.94 mg/dL — ABNORMAL HIGH (ref 0.44–1.00)
GFR, Estimated: 28 mL/min — ABNORMAL LOW (ref >60)
Glucose, Bld: 106 mg/dL — ABNORMAL HIGH (ref 70–99)
Potassium: 3.9 mmol/L (ref 3.5–5.1)
Sodium: 140 mmol/L (ref 135–145)
Total Bilirubin: 0.5 mg/dL (ref 0.3–1.2)
Total Protein: 6.9 g/dL (ref 6.5–8.1)

## 2020-02-05 LAB — CBC WITH DIFFERENTIAL/PLATELET
Abs Immature Granulocytes: 1.12 10*3/uL — ABNORMAL HIGH (ref 0.00–0.07)
Basophils Absolute: 0.1 10*3/uL (ref 0.0–0.1)
Basophils Relative: 1 %
Eosinophils Absolute: 0.5 10*3/uL (ref 0.0–0.5)
Eosinophils Relative: 4 %
HCT: 34.4 % — ABNORMAL LOW (ref 36.0–46.0)
Hemoglobin: 10.6 g/dL — ABNORMAL LOW (ref 12.0–15.0)
Immature Granulocytes: 8 %
Lymphocytes Relative: 14 %
Lymphs Abs: 1.9 10*3/uL (ref 0.7–4.0)
MCH: 28.3 pg (ref 26.0–34.0)
MCHC: 30.8 g/dL (ref 30.0–36.0)
MCV: 91.7 fL (ref 80.0–100.0)
Monocytes Absolute: 0.7 10*3/uL (ref 0.1–1.0)
Monocytes Relative: 5 %
Neutro Abs: 9.4 10*3/uL — ABNORMAL HIGH (ref 1.7–7.7)
Neutrophils Relative %: 68 %
Platelets: 470 10*3/uL — ABNORMAL HIGH (ref 150–400)
RBC: 3.75 MIL/uL — ABNORMAL LOW (ref 3.87–5.11)
RDW: 15.4 % (ref 11.5–15.5)
WBC: 13.6 10*3/uL — ABNORMAL HIGH (ref 4.0–10.5)
nRBC: 0 % (ref 0.0–0.2)

## 2020-02-05 LAB — PHOSPHORUS: Phosphorus: 3.5 mg/dL (ref 2.5–4.6)

## 2020-02-05 LAB — RETICULOCYTES
Immature Retic Fract: 20.5 % — ABNORMAL HIGH (ref 2.3–15.9)
RBC.: 3.68 MIL/uL — ABNORMAL LOW (ref 3.87–5.11)
Retic Count, Absolute: 39 10*3/uL (ref 19.0–186.0)
Retic Ct Pct: 1.1 % (ref 0.4–3.1)

## 2020-02-05 LAB — GLUCOSE, CAPILLARY
Glucose-Capillary: 121 mg/dL — ABNORMAL HIGH (ref 70–99)
Glucose-Capillary: 123 mg/dL — ABNORMAL HIGH (ref 70–99)
Glucose-Capillary: 96 mg/dL (ref 70–99)
Glucose-Capillary: 110 mg/dL — ABNORMAL HIGH (ref 70–99)

## 2020-02-05 LAB — IRON AND TIBC
Iron: 63 ug/dL (ref 28–170)
Saturation Ratios: 22 % (ref 10.4–31.8)
TIBC: 282 ug/dL (ref 250–450)
UIBC: 219 ug/dL

## 2020-02-05 LAB — MAGNESIUM: Magnesium: 2.1 mg/dL (ref 1.7–2.4)

## 2020-02-05 LAB — VITAMIN B12: Vitamin B-12: 817 pg/mL (ref 180–914)

## 2020-02-05 LAB — FERRITIN: Ferritin: 116 ng/mL (ref 11–307)

## 2020-02-05 LAB — FOLATE: Folate: 8.9 ng/mL (ref >5.9)

## 2020-02-05 MED ORDER — CEFAZOLIN SODIUM-DEXTROSE 2-4 GM/100ML-% IV SOLN
2.0000 g | Freq: Three times a day (TID) | INTRAVENOUS | Status: DC
  Administered 2020-02-06 – 2020-02-11 (×16): 2 g via INTRAVENOUS
  Filled 2020-02-05 (×19): qty 100

## 2020-02-05 MED ORDER — HYDROXYZINE HCL 25 MG PO TABS
50.0000 mg | ORAL_TABLET | Freq: Three times a day (TID) | ORAL | Status: DC | PRN
  Administered 2020-02-07 – 2020-02-09 (×4): 50 mg via ORAL
  Filled 2020-02-05 (×4): qty 2

## 2020-02-05 MED ORDER — ALPRAZOLAM 0.5 MG PO TABS
0.5000 mg | ORAL_TABLET | Freq: Three times a day (TID) | ORAL | Status: DC | PRN
  Administered 2020-02-09 – 2020-02-10 (×3): 0.5 mg via ORAL
  Filled 2020-02-05 (×3): qty 1

## 2020-02-05 NOTE — Progress Notes (Signed)
PROGRESS NOTE    [name] [name]  HWE:[id number] DOB: Jan 20, 1956 DOA: 02/03/2020 PCP: [name], MD    Brief Narrative:  This 64 years old female with PMH of type 2 diabetes uncontrolled with complication, diabetic neuropathy, hypertension, chronic diastolic CHF, chronic anemia, morbid obesity recently admitted 2 months ago for hemorrhagic shock, underwent embolization of the artery, source was upper GI bleed, has been experiencing increasing swelling and redness of right lower extremity for last few days. Patient was admitted for right leg cellulitis and was started on empiric antibiotics.  X-ray does not show any signs of osteomyelitis.  Assessment & Plan:   Principal Problem:   Lower extremity cellulitis Active Problems:   Type 2 diabetes mellitus with stage 3 chronic kidney disease (HCC)   CKD (chronic kidney disease), stage III (HCC)   Cellulitis   Essential hypertension   Morbid obesity (HCC)   RIGHT lower extremity cellulitis: Patient was found to have right lower extremity cellulitis. X-ray does not show any signs of osteomyelitis. Patient was empirically started on antibiotics. Blood cultures negative so far. Patient does not meet criteria for sepsis. Lactic acid 0.9. Bilateral lower extremity Doppler negative DVT. If there is high suspicion obtain MRI of the right extremity. Antibiotics narrowed down to Ancef.  DM type II controlled with complication/DM neuropathy -12/14 hemoglobin A1c= 6.8 -Sensitive SSI  Acute on CKD stage III (baseline Cr 1.42) -Likely multifactorial infection, diabetes, hypertension, -Hold all renal toxic medication. -Renal functions improving.   Chronic diastolic CHF -Strict in and out -Daily weight -Normal saline 20ml/hr -Labetalol PRN . Echo: 10/21: LVEF 60-65%   Essential HTN Resume home medications.  Morbid obesity -Patient would benefit from weight loss program  Anemia unspecified -Recent history  hemorrhagic shock from upper GI bleed requiring embolization and multiple blood transfusions (8 units) Hemoglobin is stable 10.8.    DVT prophylaxis:  Lovenox Code Status: Full code Family Communication:  No family at bed side. Disposition Plan:   Status is: Inpatient  Remains inpatient appropriate because:Inpatient level of care appropriate due to severity of illness   Dispo: The patient is from: Home              Anticipated d/c is to: SNF              Anticipated d/c date is: > 3 days              Patient currently is not medically stable to d/c.  Consultants:   None  Procedures:  None Antimicrobials:   Anti-infectives (From admission, onward)   Start     Dose/Rate Route Frequency Ordered Stop   02/06/20 0600  vancomycin (VANCOREADY) IVPB 1500 mg/300 mL  Status:  Discontinued        1,500 mg 150 mL/hr over 120 Minutes Intravenous Every 48 hours 02/04/20 0906 02/05/20 1410   02/06/20 0600  ceFAZolin (ANCEF) IVPB 2g/100 mL premix        2 g 200 mL/hr over 30 Minutes Intravenous Every 8 hours 02/05/20 1410     02/06/20 0400  vancomycin (VANCOREADY) IVPB 1750 mg/350 mL  Status:  Discontinued        1,750 mg 175 mL/hr over 120 Minutes Intravenous Every 48 hours 02/04/20 0323 02/04/20 0906   02/04/20 1400  cefTRIAXone (ROCEPHIN) 2 g in sodium chloride 0.9 % 100 mL IVPB  Status:  Discontinued        2 g 200 mL/hr over 30 Minutes Intravenous Every 24 hours 02/04/20 0906  02/05/20 1410   02/04/20 1400  metroNIDAZOLE (FLAGYL) tablet 500 mg  Status:  Discontinued        500 mg Oral Every 8 hours 02/04/20 0906 02/05/20 1410   02/04/20 0515  meropenem (MERREM) 1 g in sodium chloride 0.9 % 100 mL IVPB  Status:  Discontinued        1 g 200 mL/hr over 30 Minutes Intravenous Every 12 hours 02/04/20 0429 02/04/20 0906   02/04/20 0415  meropenem (MERREM) 1 g in sodium chloride 0.9 % 100 mL IVPB  Status:  Discontinued        1 g 200 mL/hr over 30 Minutes Intravenous Every 8 hours  02/04/20 0323 02/04/20 0429   02/04/20 0400  vancomycin (VANCOCIN) IVPB 1000 mg/200 mL premix  Status:  Discontinued        1,000 mg 200 mL/hr over 60 Minutes Intravenous  Once 02/04/20 0308 02/04/20 0318   02/04/20 0400  meropenem (MERREM) 1 g in sodium chloride 0.9 % 100 mL IVPB  Status:  Discontinued        1 g 200 mL/hr over 30 Minutes Intravenous  Once 02/04/20 0308 02/04/20 0323   02/04/20 0400  vancomycin (VANCOREADY) IVPB 2000 mg/400 mL        2,000 mg 200 mL/hr over 120 Minutes Intravenous  Once 02/04/20 0318 02/04/20 0521   02/03/20 2300  vancomycin (VANCOREADY) IVPB 2000 mg/400 mL  Status:  Discontinued        2,000 mg 200 mL/hr over 120 Minutes Intravenous  Once 02/03/20 2248 02/04/20 3086     Subjective: Seen and examined at bedside.  Overnight events noted.  Patient was confused but responding appropriately.  Right leg is still erythematous warm and tender.  Objective: Vitals:   02/04/20 2046 02/05/20 0500 02/05/20 0513 02/05/20 1351  BP: (!) 97/50  (!) 98/41 (!) 107/59  Pulse: 70  76 77  Resp: 16  16 14   Temp: 98.3 F (36.8 C)  97.9 F (36.6 C) 97.6 F (36.4 C)  TempSrc: Axillary  Oral Oral  SpO2: 100%  97% 100%  Weight:  (!) 143.3 kg    Height:        Intake/Output Summary (Last 24 hours) at 02/05/2020 1620 Last data filed at 02/05/2020 1611 Gross per 24 hour  Intake 2903.75 ml  Output 1000 ml  Net 1903.75 ml   Filed Weights   02/04/20 0340 02/05/20 0500  Weight: (!) 142 kg (!) 143.3 kg    Examination:  General exam: Appears calm and comfortable, confused but responding appropriately. Respiratory system: Clear to auscultation. Respiratory effort normal. Cardiovascular system: S1 & S2 heard, RRR. No JVD, murmurs, rubs, gallops or clicks. No pedal edema. Gastrointestinal system: Abdomen is nondistended, soft and nontender. No organomegaly or masses felt. Normal bowel sounds heard. Central nervous system: Alert and oriented. No focal neurological  deficits. Extremities: Right thigh area erythematous warm and tender. Skin: No rashes, lesions or ulcers Psychiatry: Judgement and insight appear normal. Mood & affect appropriate.     Data Reviewed: I have personally reviewed following labs and imaging studies  CBC: Recent Labs  Lab 02/03/20 2351 02/04/20 0654 02/05/20 0321  WBC 20.0* 16.5* 13.6*  NEUTROABS 15.8*  --  9.4*  HGB 10.8* 11.4* 10.6*  HCT 35.2* 37.2 34.4*  MCV 91.0 92.1 91.7  PLT 472* 455* 578*   Basic Metabolic Panel: Recent Labs  Lab 02/03/20 2351 02/04/20 0654 02/05/20 0321  NA 138 139 140  K 4.4 4.5 3.9  CL 99 98 103  CO2 27 28 27   GLUCOSE 118* 88 106*  BUN 62* 61* 60*  CREATININE 2.37* 2.19* 1.94*  CALCIUM 9.9 9.4 9.3  MG  --   --  2.1  PHOS  --   --  3.5   GFR: Estimated Creatinine Clearance: 39.4 mL/min (A) (by C-G formula based on SCr of 1.94 mg/dL (H)). Liver Function Tests: Recent Labs  Lab 02/03/20 2351 02/05/20 0321  AST 13* 10*  ALT 10 9  ALKPHOS 118 96  BILITOT 0.4 0.5  PROT 8.1 6.9  ALBUMIN 3.2* 2.7*   No results for input(s): LIPASE, AMYLASE in the last 168 hours. No results for input(s): AMMONIA in the last 168 hours. Coagulation Profile: Recent Labs  Lab 02/03/20 2351  INR 1.0   Cardiac Enzymes: No results for input(s): CKTOTAL, CKMB, CKMBINDEX, TROPONINI in the last 168 hours. BNP (last 3 results) No results for input(s): PROBNP in the last 8760 hours. HbA1C: Recent Labs    02/04/20 0654  HGBA1C 6.8*   CBG: Recent Labs  Lab 02/04/20 1201 02/04/20 1642 02/04/20 2109 02/05/20 0733 02/05/20 1158  GLUCAP 145* 116* 114* 121* 123*   Lipid Profile: No results for input(s): CHOL, HDL, LDLCALC, TRIG, CHOLHDL, LDLDIRECT in the last 72 hours. Thyroid Function Tests: No results for input(s): TSH, T4TOTAL, FREET4, T3FREE, THYROIDAB in the last 72 hours. Anemia Panel: Recent Labs    02/05/20 0321  VITAMINB12 817  FOLATE 8.9  FERRITIN 116  TIBC 282  IRON 63   RETICCTPCT 1.1   Sepsis Labs: Recent Labs  Lab 02/03/20 2351 02/04/20 0654  LATICACIDVEN 0.9 1.4    Recent Results (from the past 240 hour(s))  Blood Culture (routine x 2)     Status: None (Preliminary result)   Collection Time: 02/03/20 11:51 PM   Specimen: BLOOD  Result Value Ref Range Status   Specimen Description   Final    BLOOD RIGHT ANTECUBITAL Performed at [facility], [place] [address] [place], [place] [address]    Special Requests   Final    BOTTLES DRAWN AEROBIC AND ANAEROBIC Blood Culture results may not be optimal due to an excessive volume of blood received in culture bottles Performed at [facility] [address], [place], [place] [address]    Culture   Final    NO GROWTH 1 DAY Performed at [facility], [place] [address], [place], [place] [address]    Report Status PENDING  Incomplete  Resp Panel by RT-PCR (Flu A&B, Covid) Nasopharyngeal Swab     Status: None   Collection Time: 02/03/20 11:51 PM   Specimen: Nasopharyngeal Swab; Nasopharyngeal(NP) swabs in vial transport medium  Result Value Ref Range Status   SARS Coronavirus 2 by RT PCR NEGATIVE NEGATIVE Final    Comment: (NOTE) SARS-CoV-2 target nucleic acids are NOT DETECTED.  The SARS-CoV-2 RNA is generally detectable in upper respiratory specimens during the acute phase of infection. The lowest concentration of SARS-CoV-2 viral copies this assay can detect is 138 copies/mL. A negative result does not preclude SARS-Cov-2 infection and should not be used as the sole basis for treatment or other patient management decisions. A negative result may occur with  improper specimen collection/handling, submission of specimen other than nasopharyngeal swab, presence of viral mutation(s) within the areas targeted by this assay, and inadequate number of viral copies(<138 copies/mL). A negative result must be combined with clinical observations, patient history, and  epidemiological information. The expected  result is Negative.  Fact Sheet for Patients:  EntrepreneurPulse.com.au  Fact Sheet for Healthcare Providers:  IncredibleEmployment.be  This test is no t yet approved or cleared by the [place] FDA and  has been authorized for detection and/or diagnosis of SARS-CoV-2 by FDA under an Emergency Use Authorization (EUA). This EUA will remain  in effect (meaning this test can be used) for the duration of the COVID-19 declaration under Section 564(b)(1) of the Act, 21 U.S.C.section 360bbb-3(b)(1), unless the authorization is terminated  or revoked sooner.       Influenza A by PCR NEGATIVE NEGATIVE Final   Influenza B by PCR NEGATIVE NEGATIVE Final    Comment: (NOTE) The Xpert Xpress SARS-CoV-2/FLU/RSV plus assay is intended as an aid in the diagnosis of influenza from Nasopharyngeal swab specimens and should not be used as a sole basis for treatment. Nasal washings and aspirates are unacceptable for Xpert Xpress SARS-CoV-2/FLU/RSV testing.  Fact Sheet for Patients: EntrepreneurPulse.com.au  Fact Sheet for Healthcare Providers: IncredibleEmployment.be  This test is not yet approved or cleared by the [place] FDA and has been authorized for detection and/or diagnosis of SARS-CoV-2 by FDA under an Emergency Use Authorization (EUA). This EUA will remain in effect (meaning this test can be used) for the duration of the COVID-19 declaration under Section 564(b)(1) of the Act, 21 U.S.C. section 360bbb-3(b)(1), unless the authorization is terminated or revoked.  Performed at [facility], [place] [address] [place], [place] [address]      Radiology Studies: DG Tibia/Fibula Right  Result Date: 02/03/2020 CLINICAL DATA:  Cellulitis. Right leg swelling and redness. Pain EXAM: RIGHT TIBIA AND FIBULA - 2 VIEW COMPARISON:  None. FINDINGS: The  cortical margins of the tibia and fibula are intact. No bony destruction or periosteal reaction. No fracture. Moderate osteoarthritis of the knee, with fragmented and elongated quadriceps tendon enthesophyte. Ankle alignment is maintained. Soft tissue edema is noted most prominent laterally. No soft tissue air or radiopaque foreign body. Occasional soft tissue phleboliths. IMPRESSION: 1. Soft tissue edema most prominent laterally. No soft tissue air or radiopaque foreign body. 2. No evidence of osteomyelitis or acute osseous abnormality. Moderate osteoarthritis of the knee. Electronically Signed   By: Keith Rake M.D.   On: 02/03/2020 21:28   DG Femur Min 2 Views Right  Result Date: 02/03/2020 CLINICAL DATA:  Cellulitis.  Right leg swelling and redness. Pain. EXAM: RIGHT FEMUR 2 VIEWS COMPARISON:  None. FINDINGS: Cortical margins of the femur are intact. No bony destruction or fracture. There is no evidence of focal bone lesion. Hip joint space is grossly maintained, detailed assessment limited by soft tissue attenuation from habitus. Elongated and fragmented quadriceps tendon enthesophyte. There are vascular calcifications. No soft tissue air. Portions of the soft tissues are excluded from the field of view due to habitus. IMPRESSION: 1. No evidence of osteomyelitis. 2. Elongated and fragmented quadriceps tendon enthesophyte. 3. Vascular calcifications. No air in the included soft tissues, which are only partially included due to habitus. Electronically Signed   By: Keith Rake M.D.   On: 02/03/2020 21:26   VAS [place] LOWER EXTREMITY VENOUS (DVT)  Result Date: 02/04/2020  Lower Venous DVT Study Indications: Edema.  Risk Factors: None identified. Limitations: Body habitus, poor ultrasound/tissue interface and patient positioning, patient somnolence. Comparison Study: No prior studies. Performing Technologist: Oliver Hum RVT  Examination Guidelines: A complete evaluation includes B-mode imaging,  spectral Doppler, color Doppler, and power Doppler as needed of all accessible portions of each  vessel. Bilateral testing is considered an integral part of a complete examination. Limited examinations for reoccurring indications may be performed as noted. The reflux portion of the exam is performed with the patient in reverse Trendelenburg.  +---------+---------------+---------+-----------+----------+-------------------+ RIGHT    CompressibilityPhasicitySpontaneityPropertiesThrombus Aging      +---------+---------------+---------+-----------+----------+-------------------+ CFV      Full           Yes      Yes                                      +---------+---------------+---------+-----------+----------+-------------------+ SFJ      Full                                                             +---------+---------------+---------+-----------+----------+-------------------+ FV Prox  Full                                                             +---------+---------------+---------+-----------+----------+-------------------+ FV Mid                  Yes      Yes                                      +---------+---------------+---------+-----------+----------+-------------------+ FV Distal               Yes      Yes                                      +---------+---------------+---------+-----------+----------+-------------------+ PFV                                                   Not well visualized +---------+---------------+---------+-----------+----------+-------------------+ POP                     Yes      Yes                                      +---------+---------------+---------+-----------+----------+-------------------+ PTV      Full                                                             +---------+---------------+---------+-----------+----------+-------------------+ PERO  Not  well visualized +---------+---------------+---------+-----------+----------+-------------------+   +---------+---------------+---------+-----------+----------+-------------------+ LEFT     CompressibilityPhasicitySpontaneityPropertiesThrombus Aging      +---------+---------------+---------+-----------+----------+-------------------+ CFV      Full           Yes      Yes                                      +---------+---------------+---------+-----------+----------+-------------------+ SFJ      Full                                                             +---------+---------------+---------+-----------+----------+-------------------+ FV Prox  Full                                                             +---------+---------------+---------+-----------+----------+-------------------+ FV Mid   Full                                                             +---------+---------------+---------+-----------+----------+-------------------+ FV Distal                                             Not well visualized +---------+---------------+---------+-----------+----------+-------------------+ PFV                                                   Not well visualized +---------+---------------+---------+-----------+----------+-------------------+ POP      Full           Yes      Yes                                      +---------+---------------+---------+-----------+----------+-------------------+ PTV      Full                                                             +---------+---------------+---------+-----------+----------+-------------------+ PERO     Full                                                             +---------+---------------+---------+-----------+----------+-------------------+     Summary: RIGHT: - There is no evidence of deep vein thrombosis in  the lower extremity. However, portions of this examination were limited- see  technologist comments above.  - No cystic structure found in the popliteal fossa.  LEFT: - There is no evidence of deep vein thrombosis in the lower extremity. However, portions of this examination were limited- see technologist comments above.  - No cystic structure found in the popliteal fossa.  *See table(s) above for measurements and observations. Electronically signed by Deitra Mayo MD on 02/04/2020 at 1:51:47 PM.    Final     Scheduled Meds: . allopurinol  100 mg Oral BID  . atorvastatin  10 mg Oral Daily  . busPIRone  5 mg Oral Q8H  . DULoxetine  30 mg Oral BID  . enoxaparin (LOVENOX) injection  40 mg Subcutaneous Q24H  . famotidine  20 mg Oral Daily  . gabapentin  600 mg Oral BID  . insulin aspart  0-9 Units Subcutaneous TID WC  . pantoprazole  40 mg Oral BID  . zolpidem  5 mg Oral QHS   Continuous Infusions: . sodium chloride 75 mL/hr at 02/04/20 1813  . [START ON 02/06/2020]  ceFAZolin (ANCEF) IV       LOS: 1 day    Time spent: 35 mins    Saed Hudlow, MD [facility]   If 7PM-7AM, please contact night-coverage

## 2020-02-05 NOTE — TOC Initial Note (Signed)
Transition of Care [facility]) - Initial/Assessment Note    Patient Details  Name: [name] [name] MRN: [id number] Date of Birth: Dec 31, 1955  [facility] CM/SW Contact:    Lia Hopping, [place] Phone Number: 02/05/2020, 3:56 PM  Clinical Narrative:      Patient admitted for right erythema and swelling.       Re: From [facility].       CSW met with the patient at bedside to discuss her discharge plan. Patient plan is to return to [place] for rehab. CSW reached out to the admission coordinator Teena. She confirm the facility will accept her to continue rehab however they will have to initiate authorization again.  Patient reports she is looking forward to transitioning home. She reports about two weeks ago she had a follow up appointment at the [place], during the appointment they discussed arranging private caregivers when the patient discharges from SNF.   TOC staff will continue to follow this patient.   Expected Discharge Plan: Skilled Nursing Facility Barriers to Discharge: Continued Medical Work up   Patient Goals and CMS Choice        Expected Discharge Plan and Services Expected Discharge Plan: [place] In-house Referral: Clinical Social Work Discharge Planning Services: CM Consult Post Acute Care Choice: [place] Living arrangements for the past 2 months: [place]                                      Prior Living Arrangements/Services Living arrangements for the past 2 months: [place] Lives with:: Self Patient language and need for interpreter reviewed:: Yes Do you feel safe going back to the place where you live?: Yes      Need for Family Participation in Patient Care: No (Comment) Care giver support system in place?: Yes (comment) Current home services: DME Criminal Activity/Legal Involvement Pertinent to Current Situation/Hospitalization: No - Comment as  needed  Activities of Daily Living Home Assistive Devices/Equipment: Engineer, drilling (specify type) ADL Screening (condition at time of admission) Patient's cognitive ability adequate to safely complete daily activities?: Yes Is the patient deaf or have difficulty hearing?: No Does the patient have difficulty seeing, even when wearing glasses/contacts?: No Does the patient have difficulty concentrating, remembering, or making decisions?: No Patient able to express need for assistance with ADLs?: Yes Does the patient have difficulty dressing or bathing?: Yes Independently performs ADLs?: No Communication: Independent Dressing (OT): Needs assistance Is this a change from baseline?: Pre-admission baseline Grooming: Needs assistance Is this a change from baseline?: Pre-admission baseline Feeding: Independent Bathing: Needs assistance Is this a change from baseline?: Pre-admission baseline Toileting: Needs assistance Is this a change from baseline?: Pre-admission baseline In/Out Bed: Needs assistance Is this a change from baseline?: Pre-admission baseline Walks in Home: Needs assistance Is this a change from baseline?: Pre-admission baseline Does the patient have difficulty walking or climbing stairs?: Yes Weakness of Legs: Right Weakness of Arms/Hands: None  Permission Sought/Granted Permission sought to share information with : Chartered certified accountant granted to share information with : Yes, Verbal Permission Granted     Permission granted to share info w AGENCY: [employer]        Emotional Assessment Appearance:: Appears stated age Attitude/Demeanor/Rapport: Engaged Affect (typically observed): Accepting,Pleasant Orientation: : Oriented to Self,Oriented to Place,Oriented to  Time,Oriented to Situation Alcohol / Substance Use: Not Applicable Psych Involvement:  No (comment)  Admission diagnosis:  Lower extremity cellulitis [L03.119] Cellulitis  [L03.90] Patient Active Problem List   Diagnosis Date Noted  . Lower extremity cellulitis 02/04/2020  . Encephalopathy acute 11/06/2019  . Vaginal discharge 11/06/2019  . Nausea & vomiting 10/19/2019  . Morbid obesity ([place]) 08/22/2019  . AKI (acute kidney injury) ([place]) 08/21/2019  . Multiple wounds 08/21/2019  . Opiate overdose ([place]) 08/20/2019  . Cellulitis 10/05/2018  . Myalgia 10/05/2018  . Acute cystitis without hematuria 10/05/2018  . Essential hypertension 10/05/2018  . Pressure injury of skin 10/05/2018  . Hypoglycemia secondary to sulfonylurea 11/21/2016  . CKD (chronic kidney disease), stage III ([place]) 11/21/2016  . Substance induced mood disorder ([place]) 07/02/2014  . Acute renal failure syndrome ([place])   . Encephalopathy   . Hyperkalemia   . Hypokalemia   . Leukocytosis   . Type 2 diabetes mellitus with stage 3 chronic kidney disease ([place])   . HLD (hyperlipidemia)   . Depression   . Anxiety state    PCP:  [name], MD Pharmacy:   [facility] [place], [place] - [address] [place] [address] Phone: [phone] Fax: [phone]     Social Determinants of Health (SDOH) Interventions    Readmission Risk Interventions Readmission Risk Prevention Plan 11/08/2019  Transportation Screening Complete  Medication Review (RN Care Manager) Referral to Pharmacy  PCP or Specialist appointment within 3-5 days of discharge Complete  HRI or Home Care Consult Complete  SW Recovery Care/Counseling Consult Complete  Palliative Care Screening Not [place] Complete  Some recent data might be hidden

## 2020-02-05 NOTE — Consult Note (Signed)
[facility] Nurse Consult Note: Patient receiving care in [phone] Consult completed remotely after review of chart and [place] with bedside RN Reason for Consult: wound on abdomen and under panus Wound type: MASD/ITD Drainage (amount, consistency, odor) Moderate purulent green Dressing procedure/placement/frequency: Clean wounds with NS, pat dry. Apply Aquacel Advantage [name] # [phone]) to the wound, cover with 4 x 4 and secure with Medipore tape. May place InterDry Ag [name] # [phone]) in the pannus fold to wick moisture.   Monitor the wound area(s) for worsening of condition such as: Signs/symptoms of infection, increase in size, development of or worsening of odor, development of pain, or increased pain at the affected locations.   Notify the medical team if any of these develop.  Thank you for the consult. [facility] nurse will not follow at this time.   Please re-consult the [place] team if needed.  Cathlean Marseilles Tamala Julian, MSN, RN, [place], [place], [facility] Wound Treatment Associate Pager [phone]

## 2020-02-06 LAB — COMPREHENSIVE METABOLIC PANEL
ALT: 7 U/L (ref 0–44)
AST: 8 U/L — ABNORMAL LOW (ref 15–41)
Albumin: 2.6 g/dL — ABNORMAL LOW (ref 3.5–5.0)
Alkaline Phosphatase: 79 U/L (ref 38–126)
Anion gap: 10 (ref 5–15)
BUN: 48 mg/dL — ABNORMAL HIGH (ref 8–23)
CO2: 24 mmol/L (ref 22–32)
Calcium: 8.9 mg/dL (ref 8.9–10.3)
Chloride: 105 mmol/L (ref 98–111)
Creatinine, Ser: 1.43 mg/dL — ABNORMAL HIGH (ref 0.44–1.00)
GFR, Estimated: 40 mL/min — ABNORMAL LOW (ref >60)
Glucose, Bld: 96 mg/dL (ref 70–99)
Potassium: 3.8 mmol/L (ref 3.5–5.1)
Sodium: 139 mmol/L (ref 135–145)
Total Bilirubin: 0.4 mg/dL (ref 0.3–1.2)
Total Protein: 6.4 g/dL — ABNORMAL LOW (ref 6.5–8.1)

## 2020-02-06 LAB — CBC WITH DIFFERENTIAL/PLATELET
Abs Immature Granulocytes: 0.76 10*3/uL — ABNORMAL HIGH (ref 0.00–0.07)
Basophils Absolute: 0.1 10*3/uL (ref 0.0–0.1)
Basophils Relative: 1 %
Eosinophils Absolute: 0.3 10*3/uL (ref 0.0–0.5)
Eosinophils Relative: 4 %
HCT: 30.7 % — ABNORMAL LOW (ref 36.0–46.0)
Hemoglobin: 9.4 g/dL — ABNORMAL LOW (ref 12.0–15.0)
Immature Granulocytes: 8 %
Lymphocytes Relative: 20 %
Lymphs Abs: 1.8 10*3/uL (ref 0.7–4.0)
MCH: 28 pg (ref 26.0–34.0)
MCHC: 30.6 g/dL (ref 30.0–36.0)
MCV: 91.4 fL (ref 80.0–100.0)
Monocytes Absolute: 0.6 10*3/uL (ref 0.1–1.0)
Monocytes Relative: 7 %
Neutro Abs: 5.5 10*3/uL (ref 1.7–7.7)
Neutrophils Relative %: 60 %
Platelets: 490 10*3/uL — ABNORMAL HIGH (ref 150–400)
RBC: 3.36 MIL/uL — ABNORMAL LOW (ref 3.87–5.11)
RDW: 15.5 % (ref 11.5–15.5)
WBC: 9.1 10*3/uL (ref 4.0–10.5)
nRBC: 0.2 % (ref 0.0–0.2)

## 2020-02-06 LAB — PHOSPHORUS: Phosphorus: 3 mg/dL (ref 2.5–4.6)

## 2020-02-06 LAB — URINALYSIS, ROUTINE W REFLEX MICROSCOPIC
Bilirubin Urine: NEGATIVE
Glucose, UA: NEGATIVE mg/dL
Hgb urine dipstick: NEGATIVE
Ketones, ur: NEGATIVE mg/dL
Nitrite: NEGATIVE
Protein, ur: NEGATIVE mg/dL
Specific Gravity, Urine: 1.016 (ref 1.005–1.030)
pH: 5 (ref 5.0–8.0)

## 2020-02-06 LAB — MAGNESIUM: Magnesium: 2 mg/dL (ref 1.7–2.4)

## 2020-02-06 LAB — GLUCOSE, CAPILLARY
Glucose-Capillary: 94 mg/dL (ref 70–99)
Glucose-Capillary: 110 mg/dL — ABNORMAL HIGH (ref 70–99)
Glucose-Capillary: 126 mg/dL — ABNORMAL HIGH (ref 70–99)
Glucose-Capillary: 116 mg/dL — ABNORMAL HIGH (ref 70–99)

## 2020-02-06 NOTE — Plan of Care (Signed)
  Problem: Education: Goal: Knowledge of General Education information will improve Description: Including pain rating scale, medication(s)/side effects and non-pharmacologic comfort measures Outcome: Progressing   Problem: Pain Managment: Goal: General experience of comfort will improve Outcome: Progressing   Problem: Skin Integrity: Goal: Risk for impaired skin integrity will decrease Outcome: Progressing   

## 2020-02-06 NOTE — Evaluation (Signed)
Physical Therapy Evaluation Patient Details Name: [name] [name] MRN: [id number] DOB: Apr 26, 1955 Today's Date: 02/06/2020   History of Present Illness  64 y.o. female with known history of diabetes mellitus type 2, hypertension, diastolic CHF, anemia, morbid obesity recently admitted 2 months ago for hemorrhagic shock underwent embolization of the artery source was upper GI bleed has been experiencing increasing swelling and redness of the right lower extremity  Clinical Impression  Pt admitted with above diagnosis. Pt reports she was being transferred with a mechanical lift at SNF, and that she was unable to self propel a wheelchair there. In today's evaluation, pt reported RLE pain/spasming after very minimal movement (she started reaching for bedrail to initiate rolling to her side), she declined to attempt further mobility. Encouraged pt to perform ankle pumps and quad sets while in bed. Mechanical lift recommended for transfers.  Pt currently with functional limitations due to the deficits listed below (see PT Problem List). Pt will benefit from skilled PT to increase their independence and safety with mobility to allow discharge to the venue listed below.       Follow Up Recommendations SNF    Equipment Recommendations  None recommended by PT    Recommendations for Other Services       Precautions / Restrictions Precautions Precautions: Fall Restrictions Weight Bearing Restrictions: No      Mobility  Bed Mobility Overal bed mobility: Needs Assistance Bed Mobility: Rolling           General bed mobility comments: attempted rolling to L, pt did initiate reaching with RUE, total assist to initiate advancement of BLEs. After only minimal movement, pt stated she couldn't do any further movement 2* RLE spasms.    Transfers                 General transfer comment: mechanical lift recommended  Ambulation/Gait                Stairs             Wheelchair Mobility    Modified Rankin (Stroke Patients Only)       Balance                                             Pertinent Vitals/Pain Pain Assessment: Faces Faces Pain Scale: Hurts even more Pain Location: RLE with movement Pain Descriptors / Indicators: Spasm Pain Intervention(s): Limited activity within patient's tolerance;Monitored during session;Repositioned (pt declined pain meds)    Home Living Family/patient expects to be discharged to:: Skilled nursing facility                 Additional Comments: pt admitted from SNF where a mechanical lift was used for bed to [facility] transfers, pt was unable to self propel WC, she reports she was getting PT there and she worked on LE exercises and supine to sit with +2 assist    Prior Function Level of Independence: Needs assistance   Gait / Transfers Assistance Needed: mechanical lift at SNF, could not self propel WC  ADL's / Homemaking Assistance Needed: dependent        Hand Dominance        Extremity/Trunk Assessment   Upper Extremity Assessment Upper Extremity Assessment: Defer to OT evaluation    Lower Extremity Assessment Lower Extremity Assessment: Generalized weakness;RLE deficits/detail;LLE deficits/detail RLE Deficits / Details: painful with more  than ~30* knee flexion; able to perform ankle pumps and a weak quad set (+3/5) RLE: Unable to fully assess due to pain RLE Sensation: WNL LLE Deficits / Details: knee flexion ~50* (assessment limited as it was bed level), quad set 4/5 LLE Sensation: WNL       Communication   Communication: No difficulties  Cognition Arousal/Alertness: Awake/alert Behavior During Therapy: WFL for tasks assessed/performed Overall Cognitive Status: Within Functional Limits for tasks assessed                                        General Comments      Exercises General Exercises - Lower Extremity Ankle Circles/Pumps:  AROM;Both;10 reps;Supine Quad Sets: AROM;Both;5 reps;Supine Heel Slides: AAROM;Both;5 reps;Supine   Assessment/Plan    PT Assessment Patient needs continued PT services  PT Problem List Decreased strength;Decreased mobility;Decreased activity tolerance;Decreased range of motion;Pain       PT Treatment Interventions Therapeutic activities;Therapeutic exercise;Patient/family education;Functional mobility training    PT Goals (Current goals can be found in the Care Plan section)  Acute Rehab PT Goals Patient Stated Goal: none stated PT Goal Formulation: Patient unable to participate in goal setting Time For Goal Achievement: 02/20/20 Potential to Achieve Goals: Fair    Frequency Min 2X/week   Barriers to discharge        Co-evaluation               AM-PAC PT "6 Clicks" Mobility  Outcome Measure Help needed turning from your back to your side while in a flat bed without using bedrails?: Total Help needed moving from lying on your back to sitting on the side of a flat bed without using bedrails?: Total Help needed moving to and from a bed to a chair (including a wheelchair)?: Total Help needed standing up from a chair using your arms (e.g., wheelchair or bedside chair)?: Total Help needed to walk in hospital room?: Total Help needed climbing 3-5 steps with a railing? : Total 6 Click Score: 6    End of Session   Activity Tolerance: Patient limited by pain Patient left: in bed;with call bell/phone within reach;with bed alarm set Nurse Communication: Mobility status;Need for lift equipment PT Visit Diagnosis: Muscle weakness (generalized) (M62.81);Pain;Difficulty in walking, not elsewhere classified (R26.2) Pain - Right/Left: Right Pain - part of body: Knee    Time: 5631-4970 PT Time Calculation (min) (ACUTE ONLY): 10 min   Charges:   PT Evaluation $PT Eval Low Complexity: 1 Low         Blondell Reveal Kistler PT 02/06/2020  Acute Rehabilitation  Services Pager [phone] Office [phone]

## 2020-02-06 NOTE — NC FL2 (Signed)
[place] LEVEL OF CARE SCREENING TOOL     IDENTIFICATION  Patient Name: [name] [name] Birthdate: [birth year] Sex: female Admission Date (Current Location): 02/03/2020  [facility] and [place] Number:  Herbalist and Address:  [facility],  [address] [place], [place]      Provider Number: [id number]  Attending Physician Name and Address:  Shawna Clamp, MD  Relative Name and Phone Number:  Frederick Peers [phone]  [phone]  Elson Areas [phone]  [phone]  [place] Sister [phone]  [phone]    Current Level of Care: Hospital Recommended Level of Care: [place] Prior Approval Number:    Date Approved/Denied:   PASRR Number: [id number]  Discharge Plan: SNF    Current Diagnoses: Patient Active Problem List   Diagnosis Date Noted  . Lower extremity cellulitis 02/04/2020  . Encephalopathy acute 11/06/2019  . Vaginal discharge 11/06/2019  . Nausea & vomiting 10/19/2019  . Morbid obesity ([place]) 08/22/2019  . AKI (acute kidney injury) ([place]) 08/21/2019  . Multiple wounds 08/21/2019  . Opiate overdose ([place]) 08/20/2019  . Cellulitis 10/05/2018  . Myalgia 10/05/2018  . Acute cystitis without hematuria 10/05/2018  . Essential hypertension 10/05/2018  . Pressure injury of skin 10/05/2018  . Hypoglycemia secondary to sulfonylurea 11/21/2016  . CKD (chronic kidney disease), stage III ([place]) 11/21/2016  . Substance induced mood disorder ([place]) 07/02/2014  . Acute renal failure syndrome ([place])   . Encephalopathy   . Hyperkalemia   . Hypokalemia   . Leukocytosis   . Type 2 diabetes mellitus with stage 3 chronic kidney disease ([place])   . HLD (hyperlipidemia)   . Depression   . Anxiety state     Orientation RESPIRATION BLADDER Height & Weight     Self,Time,Situation,Place  Normal External catheter Weight: (!) 318 lb 4.8 oz (144.4 kg) Height:  5\' 2"  (157.5 cm)  BEHAVIORAL  SYMPTOMS/MOOD NEUROLOGICAL BOWEL NUTRITION STATUS      Continent Diet (Heart Healthy/Carb Modified)  AMBULATORY STATUS COMMUNICATION OF NEEDS Skin   Total Care Verbally PU Stage and Appropriate Care  Wound on Abdomen, Pannus, and lower left extremities                       Personal Care Assistance Level of Assistance  Bathing,Feeding,Dressing Bathing Assistance: Maximum assistance Feeding assistance: Limited assistance Dressing Assistance: Maximum assistance     Functional Limitations Info  Sight,Hearing,Speech Sight Info: Adequate Hearing Info: Adequate Speech Info: Adequate    SPECIAL CARE FACTORS FREQUENCY  PT (By licensed PT),OT (By licensed OT)     PT Frequency: 5x/week OT Frequency: 5x/week            Contractures Contractures Info: Not present    Additional Factors Info  Code Status,Allergies Code Status Info: Fullcode Allergies Info: Allergies: Morphine, Penicillins           Current Medications (02/06/2020):  This is the current hospital active medication list Current Facility-Administered Medications  Medication Dose Route Frequency Provider Last Rate Last Admin  . 0.9 %  sodium chloride infusion   Intravenous Continuous Allie Bossier, MD 75 mL/hr at 02/06/20 0312 New Bag at 02/06/20 0312  . acetaminophen (TYLENOL) tablet 650 mg  650 mg Oral Q6H PRN Rise Patience, MD   650 mg at 02/06/20 5170   Or  . acetaminophen (TYLENOL) suppository 650 mg  650 mg Rectal Q6H PRN Rise Patience, MD      . allopurinol (ZYLOPRIM) tablet  100 mg  100 mg Oral BID Rise Patience, MD   100 mg at 02/06/20 1478  . ALPRAZolam Duanne Moron) tablet 0.5 mg  0.5 mg Oral TID PRN Rise Patience, MD      . atorvastatin (LIPITOR) tablet 10 mg  10 mg Oral Daily Rise Patience, MD   10 mg at 02/06/20 2956  . busPIRone (BUSPAR) tablet 5 mg  5 mg Oral Q8H Rise Patience, MD   5 mg at 02/06/20 1346  . ceFAZolin (ANCEF) IVPB 2g/100 mL premix  2 g  Intravenous Q8H Shawna Clamp, MD 200 mL/hr at 02/06/20 1347 2 g at 02/06/20 1347  . DULoxetine (CYMBALTA) DR capsule 30 mg  30 mg Oral BID Rise Patience, MD   30 mg at 02/06/20 2130  . enoxaparin (LOVENOX) injection 40 mg  40 mg Subcutaneous Q24H Rise Patience, MD   40 mg at 02/06/20 0930  . famotidine (PEPCID) tablet 20 mg  20 mg Oral Daily Rise Patience, MD   20 mg at 02/06/20 8657  . gabapentin (NEURONTIN) capsule 600 mg  600 mg Oral BID Rise Patience, MD   600 mg at 02/06/20 8469  . HYDROcodone-acetaminophen (NORCO/VICODIN) 5-325 MG per tablet 1 tablet  1 tablet Oral Q8H PRN Rise Patience, MD      . hydrOXYzine (ATARAX/VISTARIL) tablet 50 mg  50 mg Oral Q8H PRN Rise Patience, MD      . insulin aspart (novoLOG) injection 0-9 Units  0-9 Units Subcutaneous TID WC Rise Patience, MD   1 Units at 02/05/20 1214  . labetalol (NORMODYNE) injection 10 mg  10 mg Intravenous Q2H PRN Rise Patience, MD      . pantoprazole (PROTONIX) EC tablet 40 mg  40 mg Oral BID Rise Patience, MD   40 mg at 02/06/20 6295  . traZODone (DESYREL) tablet 50 mg  50 mg Oral QHS PRN Rise Patience, MD      . zolpidem [facility]) tablet 5 mg  5 mg Oral QHS Rise Patience, MD   5 mg at 02/05/20 2202     Discharge Medications: Please see discharge summary for a list of discharge medications.  Relevant Imaging Results:  Relevant Lab Results:   Additional Information SSN [id number]  Lia Hopping, LCSW

## 2020-02-06 NOTE — TOC Progression Note (Addendum)
Transition of Care [facility]) - Progression Note    Patient Details  Name: [name] [name] MRN: [id number] Date of Birth: 1956-02-17  Transition of Care [facility]) CM/SW [place], [place] Phone Number: 02/06/2020, 4:40 PM  Clinical Narrative:   CSW attempted to reach [facility] Admissions coordinator Teena [place] voicemail. No return call. CSW called the main number,and learned the SNF admitting phone is broken.  Facility notified to initiate [employer] authorization.   TOC staff will continue to follow this patient.  Patient will need covid test prior to d/c  Expected Discharge Plan: Skilled Nursing Facility Barriers to Discharge: Continued Medical Work up  Expected Discharge Plan and Services Expected Discharge Plan: [place] In-house Referral: Clinical Social Work Discharge Planning Services: CM Consult Post Acute Care Choice: [place] Living arrangements for the past 2 months: [place]                                       Social Determinants of Health (SDOH) Interventions    Readmission Risk Interventions Readmission Risk Prevention Plan 02/06/2020 11/08/2019  Transportation Screening Complete Complete  Medication Review [employer]) - Referral to Pharmacy  PCP or Specialist appointment within 3-5 days of discharge Complete Complete  HRI or [place] - Complete  SW Recovery Care/Counseling Consult Complete Complete  Palliative Care Screening Not Applicable Not Applicable  Skilled Nursing Facility Complete Complete  Some recent data might be hidden

## 2020-02-06 NOTE — Progress Notes (Signed)
PROGRESS NOTE    [name] [name]  EUM:[id number] DOB: [birth year] DOA: 02/03/2020 PCP: [name], MD    Brief Narrative:  This 64 years old female with PMH of type 2 diabetes uncontrolled with complication, diabetic neuropathy, hypertension, chronic diastolic CHF, chronic anemia, morbid obesity recently admitted 2 months ago for hemorrhagic shock, underwent embolization of the artery, source was upper GI bleed, has been experiencing increasing swelling and redness of right lower extremity for last few days. Patient was admitted for right leg cellulitis and was started on empiric antibiotics.  X-ray does not show any signs of osteomyelitis.  Assessment & Plan:   Principal Problem:   Lower extremity cellulitis Active Problems:   Type 2 diabetes mellitus with stage 3 chronic kidney disease (HCC)   CKD (chronic kidney disease), stage III (HCC)   Cellulitis   Essential hypertension   Morbid obesity (HCC)   RIGHT lower extremity cellulitis: Patient was found to have right lower extremity cellulitis. X-ray does not show any signs of osteomyelitis. Patient was empirically started on antibiotics. Blood cultures negative so far. Patient does not meet criteria for sepsis. Lactic acid 0.9. Bilateral lower extremity Doppler negative DVT. If there is high suspicion obtain MRI of the right extremity. Antibiotics narrowed down to Ancef. Leg cellulitis improving.  DM type II controlled with complication/DM neuropathy -12/14 hemoglobin A1c= 6.8 -Sensitive SSI.  Acute on CKD stage III (baseline Cr 1.42) -Likely multifactorial infection, diabetes, hypertension, -Hold all renal toxic medication. - Renal functions improving.   Chronic diastolic CHF -Strict in and out -Daily weight -Normal saline 78ml/hr -Labetalol PRN . Echo: 10/21: LVEF 60-65%   Essential HTN Resume home medications.  Morbid obesity -Patient would benefit from weight loss program  Anemia  unspecified -Recent history hemorrhagic shock from upper GI bleed requiring embolization and multiple blood transfusions (8 units) Hemoglobin is stable 10.8.    DVT prophylaxis:  Lovenox Code Status: Full code Family Communication:  No family at bed side. Disposition Plan:   Status is: Inpatient  Remains inpatient appropriate because:Inpatient level of care appropriate due to severity of illness   Dispo: The patient is from: Home              Anticipated d/c is to: SNF              Anticipated d/c date is:1-2 days              Patient currently is not medically stable to d/c.  Consultants:   None  Procedures:  None Antimicrobials:   Anti-infectives (From admission, onward)   Start     Dose/Rate Route Frequency Ordered Stop   02/06/20 0600  vancomycin (VANCOREADY) IVPB 1500 mg/300 mL  Status:  Discontinued        1,500 mg 150 mL/hr over 120 Minutes Intravenous Every 48 hours 02/04/20 0906 02/05/20 1410   02/06/20 0600  ceFAZolin (ANCEF) IVPB 2g/100 mL premix        2 g 200 mL/hr over 30 Minutes Intravenous Every 8 hours 02/05/20 1410     02/06/20 0400  vancomycin (VANCOREADY) IVPB 1750 mg/350 mL  Status:  Discontinued        1,750 mg 175 mL/hr over 120 Minutes Intravenous Every 48 hours 02/04/20 0323 02/04/20 0906   02/04/20 1400  cefTRIAXone (ROCEPHIN) 2 g in sodium chloride 0.9 % 100 mL IVPB  Status:  Discontinued        2 g 200 mL/hr over 30 Minutes Intravenous Every 24 hours  02/04/20 0906 02/05/20 1410   02/04/20 1400  metroNIDAZOLE (FLAGYL) tablet 500 mg  Status:  Discontinued        500 mg Oral Every 8 hours 02/04/20 0906 02/05/20 1410   02/04/20 0515  meropenem (MERREM) 1 g in sodium chloride 0.9 % 100 mL IVPB  Status:  Discontinued        1 g 200 mL/hr over 30 Minutes Intravenous Every 12 hours 02/04/20 0429 02/04/20 0906   02/04/20 0415  meropenem (MERREM) 1 g in sodium chloride 0.9 % 100 mL IVPB  Status:  Discontinued        1 g 200 mL/hr over 30 Minutes  Intravenous Every 8 hours 02/04/20 0323 02/04/20 0429   02/04/20 0400  vancomycin (VANCOCIN) IVPB 1000 mg/200 mL premix  Status:  Discontinued        1,000 mg 200 mL/hr over 60 Minutes Intravenous  Once 02/04/20 0308 02/04/20 0318   02/04/20 0400  meropenem (MERREM) 1 g in sodium chloride 0.9 % 100 mL IVPB  Status:  Discontinued        1 g 200 mL/hr over 30 Minutes Intravenous  Once 02/04/20 0308 02/04/20 0323   02/04/20 0400  vancomycin (VANCOREADY) IVPB 2000 mg/400 mL        2,000 mg 200 mL/hr over 120 Minutes Intravenous  Once 02/04/20 0318 02/04/20 0521   02/03/20 2300  vancomycin (VANCOREADY) IVPB 2000 mg/400 mL  Status:  Discontinued        2,000 mg 200 mL/hr over 120 Minutes Intravenous  Once 02/03/20 2248 02/04/20 8563     Subjective: Seen and examined at bedside. No overnight events.  Patient reports feeling better, She is Alert and oriented x 3.  Right leg is still erythematous,  warm and tender.  Objective: Vitals:   02/05/20 2221 02/06/20 0344 02/06/20 0518 02/06/20 1408  BP: (!) 121/58  (!) 103/50 (!) 109/49  Pulse: 75  78 77  Resp:   16 16  Temp: 98.7 F (37.1 C)  98.2 F (36.8 C) 97.6 F (36.4 C)  TempSrc: Oral  Oral Oral  SpO2:   100% 100%  Weight:  (!) 144.4 kg    Height:        Intake/Output Summary (Last 24 hours) at 02/06/2020 1430 Last data filed at 02/06/2020 1000 Gross per 24 hour  Intake 2657.03 ml  Output 400 ml  Net 2257.03 ml   Filed Weights   02/04/20 0340 02/05/20 0500 02/06/20 0344  Weight: (!) 142 kg (!) 143.3 kg (!) 144.4 kg    Examination:  General exam: Appears calm and comfortable, confused but responding appropriately. Respiratory system: Clear to auscultation. Respiratory effort normal. Cardiovascular system: S1 & S2 heard, RRR. No JVD, murmurs, rubs, gallops or clicks. No pedal edema. Gastrointestinal system: Abdomen is nondistended, soft and nontender. No organomegaly or masses felt. Normal bowel sounds heard. Central nervous  system: Alert and oriented. No focal neurological deficits. Extremities: Right thigh area erythematous warm and tender. Skin: No rashes, lesions or ulcers Psychiatry: Judgement and insight appear normal. Mood & affect appropriate.     Data Reviewed: I have personally reviewed following labs and imaging studies  CBC: Recent Labs  Lab 02/03/20 2351 02/04/20 0654 02/05/20 0321 02/06/20 0325  WBC 20.0* 16.5* 13.6* 9.1  NEUTROABS 15.8*  --  9.4* 5.5  HGB 10.8* 11.4* 10.6* 9.4*  HCT 35.2* 37.2 34.4* 30.7*  MCV 91.0 92.1 91.7 91.4  PLT 472* 455* 470* 149*   Basic Metabolic Panel: Recent  Labs  Lab 02/03/20 2351 02/04/20 0654 02/05/20 0321 02/06/20 0325  NA 138 139 140 139  K 4.4 4.5 3.9 3.8  CL 99 98 103 105  CO2 27 28 27 24   GLUCOSE 118* 88 106* 96  BUN 62* 61* 60* 48*  CREATININE 2.37* 2.19* 1.94* 1.43*  CALCIUM 9.9 9.4 9.3 8.9  MG  --   --  2.1 2.0  PHOS  --   --  3.5 3.0   GFR: Estimated Creatinine Clearance: 53.6 mL/min (A) (by C-G formula based on SCr of 1.43 mg/dL (H)). Liver Function Tests: Recent Labs  Lab 02/03/20 2351 02/05/20 0321 02/06/20 0325  AST 13* 10* 8*  ALT 10 9 7   ALKPHOS 118 96 79  BILITOT 0.4 0.5 0.4  PROT 8.1 6.9 6.4*  ALBUMIN 3.2* 2.7* 2.6*   No results for input(s): LIPASE, AMYLASE in the last 168 hours. No results for input(s): AMMONIA in the last 168 hours. Coagulation Profile: Recent Labs  Lab 02/03/20 2351  INR 1.0   Cardiac Enzymes: No results for input(s): CKTOTAL, CKMB, CKMBINDEX, TROPONINI in the last 168 hours. BNP (last 3 results) No results for input(s): PROBNP in the last 8760 hours. HbA1C: Recent Labs    02/04/20 0654  HGBA1C 6.8*   CBG: Recent Labs  Lab 02/05/20 1700 02/05/20 2200 02/06/20 0725 02/06/20 1240 02/06/20 1246  GLUCAP 96 110* 94 34* 110*   Lipid Profile: No results for input(s): CHOL, HDL, LDLCALC, TRIG, CHOLHDL, LDLDIRECT in the last 72 hours. Thyroid Function Tests: No results for  input(s): TSH, T4TOTAL, FREET4, T3FREE, THYROIDAB in the last 72 hours. Anemia Panel: Recent Labs    02/05/20 0321  VITAMINB12 817  FOLATE 8.9  FERRITIN 116  TIBC 282  IRON 63  RETICCTPCT 1.1   Sepsis Labs: Recent Labs  Lab 02/03/20 2351 02/04/20 0654  LATICACIDVEN 0.9 1.4    Recent Results (from the past 240 hour(s))  Blood Culture (routine x 2)     Status: None (Preliminary result)   Collection Time: 02/03/20 11:51 PM   Specimen: BLOOD  Result Value Ref Range Status   Specimen Description   Final    BLOOD RIGHT ANTECUBITAL Performed at [facility], [place] [address] [place], [place] [address]    Special Requests   Final    BOTTLES DRAWN AEROBIC AND ANAEROBIC Blood Culture results may not be optimal due to an excessive volume of blood received in culture bottles Performed at [place] [address], [place], [place] [address]    Culture   Final    NO GROWTH 1 DAY Performed at [facility], [place] [address] [place], [place] [address]    Report Status PENDING  Incomplete  Resp Panel by RT-PCR (Flu A&B, Covid) Nasopharyngeal Swab     Status: None   Collection Time: 02/03/20 11:51 PM   Specimen: Nasopharyngeal Swab; Nasopharyngeal(NP) swabs in vial transport medium  Result Value Ref Range Status   SARS Coronavirus 2 by RT PCR NEGATIVE NEGATIVE Final    Comment: (NOTE) SARS-CoV-2 target nucleic acids are NOT DETECTED.  The SARS-CoV-2 RNA is generally detectable in upper respiratory specimens during the acute phase of infection. The lowest concentration of SARS-CoV-2 viral copies this assay can detect is 138 copies/mL. A negative result does not preclude SARS-Cov-2 infection and should not be used as the sole basis for treatment or other patient management decisions. A negative result may occur with  improper specimen collection/handling, submission of  specimen other than nasopharyngeal swab, presence of viral mutation(s)  within the areas targeted by this assay, and inadequate number of viral copies(<138 copies/mL). A negative result must be combined with clinical observations, patient history, and epidemiological information. The expected result is Negative.  Fact Sheet for Patients:  EntrepreneurPulse.com.au  Fact Sheet for Healthcare Providers:  IncredibleEmployment.be  This test is no t yet approved or cleared by the [place] FDA and  has been authorized for detection and/or diagnosis of SARS-CoV-2 by FDA under an Emergency Use Authorization (EUA). This EUA will remain  in effect (meaning this test can be used) for the duration of the COVID-19 declaration under Section 564(b)(1) of the Act, 21 U.S.C.section 360bbb-3(b)(1), unless the authorization is terminated  or revoked sooner.       Influenza A by PCR NEGATIVE NEGATIVE Final   Influenza B by PCR NEGATIVE NEGATIVE Final    Comment: (NOTE) The Xpert Xpress SARS-CoV-2/FLU/RSV plus assay is intended as an aid in the diagnosis of influenza from Nasopharyngeal swab specimens and should not be used as a sole basis for treatment. Nasal washings and aspirates are unacceptable for Xpert Xpress SARS-CoV-2/FLU/RSV testing.  Fact Sheet for Patients: EntrepreneurPulse.com.au  Fact Sheet for Healthcare Providers: IncredibleEmployment.be  This test is not yet approved or cleared by the [place] FDA and has been authorized for detection and/or diagnosis of SARS-CoV-2 by FDA under an Emergency Use Authorization (EUA). This EUA will remain in effect (meaning this test can be used) for the duration of the COVID-19 declaration under Section 564(b)(1) of the Act, 21 U.S.C. section 360bbb-3(b)(1), unless the authorization is terminated or revoked.  Performed at [facility], [place] [address], [place], [place] [address]      Radiology Studies: No results  found.  Scheduled Meds: . allopurinol  100 mg Oral BID  . atorvastatin  10 mg Oral Daily  . busPIRone  5 mg Oral Q8H  . DULoxetine  30 mg Oral BID  . enoxaparin (LOVENOX) injection  40 mg Subcutaneous Q24H  . famotidine  20 mg Oral Daily  . gabapentin  600 mg Oral BID  . insulin aspart  0-9 Units Subcutaneous TID WC  . pantoprazole  40 mg Oral BID  . zolpidem  5 mg Oral QHS   Continuous Infusions: . sodium chloride 75 mL/hr at 02/06/20 0312  .  ceFAZolin (ANCEF) IV 2 g (02/06/20 1347)     LOS: 2 days    Time spent: 25 mins    Rubi Tooley, MD [facility]   If 7PM-7AM, please contact night-coverage

## 2020-02-07 ENCOUNTER — Inpatient Hospital Stay (HOSPITAL_COMMUNITY): Payer: No Typology Code available for payment source

## 2020-02-07 LAB — CBC WITH DIFFERENTIAL/PLATELET
Abs Immature Granulocytes: 0.66 10*3/uL — ABNORMAL HIGH (ref 0.00–0.07)
Basophils Absolute: 0.1 10*3/uL (ref 0.0–0.1)
Basophils Relative: 1 %
Eosinophils Absolute: 0.3 10*3/uL (ref 0.0–0.5)
Eosinophils Relative: 4 %
HCT: 37 % (ref 36.0–46.0)
Hemoglobin: 11.7 g/dL — ABNORMAL LOW (ref 12.0–15.0)
Immature Granulocytes: 7 %
Lymphocytes Relative: 21 %
Lymphs Abs: 2 10*3/uL (ref 0.7–4.0)
MCH: 28.2 pg (ref 26.0–34.0)
MCHC: 31.6 g/dL (ref 30.0–36.0)
MCV: 89.2 fL (ref 80.0–100.0)
Monocytes Absolute: 0.5 10*3/uL (ref 0.1–1.0)
Monocytes Relative: 6 %
Neutro Abs: 6.1 10*3/uL (ref 1.7–7.7)
Neutrophils Relative %: 61 %
Platelets: 509 10*3/uL — ABNORMAL HIGH (ref 150–400)
RBC: 4.15 MIL/uL (ref 3.87–5.11)
RDW: 15.5 % (ref 11.5–15.5)
WBC: 9.8 10*3/uL (ref 4.0–10.5)
nRBC: 0.2 % (ref 0.0–0.2)

## 2020-02-07 LAB — COMPREHENSIVE METABOLIC PANEL
ALT: 7 U/L (ref 0–44)
AST: 11 U/L — ABNORMAL LOW (ref 15–41)
Albumin: 2.7 g/dL — ABNORMAL LOW (ref 3.5–5.0)
Alkaline Phosphatase: 79 U/L (ref 38–126)
Anion gap: 10 (ref 5–15)
BUN: 34 mg/dL — ABNORMAL HIGH (ref 8–23)
CO2: 25 mmol/L (ref 22–32)
Calcium: 9.2 mg/dL (ref 8.9–10.3)
Chloride: 106 mmol/L (ref 98–111)
Creatinine, Ser: 1.18 mg/dL — ABNORMAL HIGH (ref 0.44–1.00)
GFR, Estimated: 51 mL/min — ABNORMAL LOW (ref >60)
Glucose, Bld: 107 mg/dL — ABNORMAL HIGH (ref 70–99)
Potassium: 3.6 mmol/L (ref 3.5–5.1)
Sodium: 141 mmol/L (ref 135–145)
Total Bilirubin: 0.3 mg/dL (ref 0.3–1.2)
Total Protein: 6.7 g/dL (ref 6.5–8.1)

## 2020-02-07 LAB — GLUCOSE, CAPILLARY
Glucose-Capillary: 94 mg/dL (ref 70–99)
Glucose-Capillary: 135 mg/dL — ABNORMAL HIGH (ref 70–99)
Glucose-Capillary: 111 mg/dL — ABNORMAL HIGH (ref 70–99)
Glucose-Capillary: 125 mg/dL — ABNORMAL HIGH (ref 70–99)

## 2020-02-07 LAB — URINE CULTURE: Culture: NO GROWTH

## 2020-02-07 LAB — SEDIMENTATION RATE: Sed Rate: 91 mm/hr — ABNORMAL HIGH (ref 0–22)

## 2020-02-07 LAB — MAGNESIUM: Magnesium: 1.6 mg/dL — ABNORMAL LOW (ref 1.7–2.4)

## 2020-02-07 LAB — PHOSPHORUS: Phosphorus: 2.5 mg/dL (ref 2.5–4.6)

## 2020-02-07 MED ORDER — MELATONIN 5 MG PO TABS
5.0000 mg | ORAL_TABLET | Freq: Every day | ORAL | Status: DC
  Administered 2020-02-07 – 2020-02-10 (×4): 5 mg via ORAL
  Filled 2020-02-07 (×4): qty 1

## 2020-02-07 MED ORDER — MAGNESIUM SULFATE 2 GM/50ML IV SOLN
2.0000 g | Freq: Once | INTRAVENOUS | Status: AC
  Administered 2020-02-07: 10:00:00 2 g via INTRAVENOUS
  Filled 2020-02-07: qty 50
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
## 2020-02-07 NOTE — Progress Notes (Signed)
PROGRESS NOTE    [name] [name]  HAL:[id number] DOB: 1955-06-09 DOA: 02/03/2020 PCP: [name], MD    Brief Narrative:  This 64 years old female with PMH of type 2 diabetes uncontrolled with complication, diabetic neuropathy, hypertension, chronic diastolic CHF, chronic anemia, morbid obesity recently admitted 2 months ago for hemorrhagic shock, underwent embolization of the artery, source was upper GI bleed, has been experiencing increasing swelling and redness of right lower extremity for last few days. Patient was admitted for right leg cellulitis and was started on empiric antibiotics.  X-ray does not show any signs of osteomyelitis.  General surgery consulted,  recommended right lower extremity ultrasound to rule out abscess.  Assessment & Plan:   Principal Problem:   Lower extremity cellulitis Active Problems:   Type 2 diabetes mellitus with stage 3 chronic kidney disease (HCC)   CKD (chronic kidney disease), stage III (HCC)   Cellulitis   Essential hypertension   Morbid obesity (HCC)   RIGHT lower extremity cellulitis: Patient was found to have right lower extremity cellulitis. X-ray does not show any signs of osteomyelitis. Patient was empirically started on antibiotics. Blood cultures negative so far. Patient does not meet criteria for sepsis. Lactic acid 0.9. Bilateral lower extremity Doppler negative DVT. If there is high suspicion obtain MRI of the right extremity. Antibiotics narrowed down to Ancef. Leg cellulitis slowly improving but continued to remain persistent. General surgery consulted,  recommended right lower extremity ultrasound to rule out abscess.   DM type II controlled with complication/DM neuropathy -12/14 hemoglobin A1c= 6.8 -Sensitive SSI.  Acute on CKD stage III (baseline Cr 1.42) -Likely multifactorial infection, diabetes, hypertension, -Hold all renal toxic medication. - Renal functions improving.   Chronic diastolic  CHF -Strict in and out put charting -Daily weight -Normal saline 89ml/hr. -Labetalol PRN . Echo: 10/21: LVEF 60-65%   Essential HTN Resume home medications.  Morbid obesity -Patient would benefit from weight loss program  Anemia unspecified -Recent history hemorrhagic shock from upper GI bleed requiring embolization and multiple blood transfusions (8 units) Hemoglobin is stable 10.8.    DVT prophylaxis:  Lovenox Code Status: Full code Family Communication:  No family at bed side. Disposition Plan:   Status is: Inpatient  Remains inpatient appropriate because:Inpatient level of care appropriate due to severity of illness   Dispo: The patient is from: Home              Anticipated d/c is to: SNF              Anticipated d/c date is:1-2 days              Patient currently is not medically stable to d/c.  Consultants:   None  Procedures:  None Antimicrobials:   Anti-infectives (From admission, onward)   Start     Dose/Rate Route Frequency Ordered Stop   02/06/20 0600  vancomycin (VANCOREADY) IVPB 1500 mg/300 mL  Status:  Discontinued        1,500 mg 150 mL/hr over 120 Minutes Intravenous Every 48 hours 02/04/20 0906 02/05/20 1410   02/06/20 0600  ceFAZolin (ANCEF) IVPB 2g/100 mL premix        2 g 200 mL/hr over 30 Minutes Intravenous Every 8 hours 02/05/20 1410     02/06/20 0400  vancomycin (VANCOREADY) IVPB 1750 mg/350 mL  Status:  Discontinued        1,750 mg 175 mL/hr over 120 Minutes Intravenous Every 48 hours 02/04/20 0323 02/04/20 0906   02/04/20  1400  cefTRIAXone (ROCEPHIN) 2 g in sodium chloride 0.9 % 100 mL IVPB  Status:  Discontinued        2 g 200 mL/hr over 30 Minutes Intravenous Every 24 hours 02/04/20 0906 02/05/20 1410   02/04/20 1400  metroNIDAZOLE (FLAGYL) tablet 500 mg  Status:  Discontinued        500 mg Oral Every 8 hours 02/04/20 0906 02/05/20 1410   02/04/20 0515  meropenem (MERREM) 1 g in sodium chloride 0.9 % 100 mL IVPB  Status:   Discontinued        1 g 200 mL/hr over 30 Minutes Intravenous Every 12 hours 02/04/20 0429 02/04/20 0906   02/04/20 0415  meropenem (MERREM) 1 g in sodium chloride 0.9 % 100 mL IVPB  Status:  Discontinued        1 g 200 mL/hr over 30 Minutes Intravenous Every 8 hours 02/04/20 0323 02/04/20 0429   02/04/20 0400  vancomycin (VANCOCIN) IVPB 1000 mg/200 mL premix  Status:  Discontinued        1,000 mg 200 mL/hr over 60 Minutes Intravenous  Once 02/04/20 0308 02/04/20 0318   02/04/20 0400  meropenem (MERREM) 1 g in sodium chloride 0.9 % 100 mL IVPB  Status:  Discontinued        1 g 200 mL/hr over 30 Minutes Intravenous  Once 02/04/20 0308 02/04/20 0323   02/04/20 0400  vancomycin (VANCOREADY) IVPB 2000 mg/400 mL        2,000 mg 200 mL/hr over 120 Minutes Intravenous  Once 02/04/20 0318 02/04/20 0521   02/03/20 2300  vancomycin (VANCOREADY) IVPB 2000 mg/400 mL  Status:  Discontinued        2,000 mg 200 mL/hr over 120 Minutes Intravenous  Once 02/03/20 2248 02/04/20 7619     Subjective: Patient was seen and examined at bedside. No overnight events.   Patient reports feeling better, She is Alert and oriented x 3.  Right leg is still erythematous,  warm and tender.   Objective: Vitals:   02/06/20 2128 02/07/20 0606 02/07/20 0614 02/07/20 1454  BP: 122/69 (!) 108/53  (!) 134/58  Pulse: 82 90  91  Resp: 16 16  17   Temp: 99.1 F (37.3 C) 98.2 F (36.8 C)  98 F (36.7 C)  TempSrc: Oral Oral  Oral  SpO2: 96% 93%  95%  Weight:   (!) 144 kg   Height:        Intake/Output Summary (Last 24 hours) at 02/07/2020 1521 Last data filed at 02/07/2020 5093 Gross per 24 hour  Intake 1469.86 ml  Output 1125 ml  Net 344.86 ml   Filed Weights   02/05/20 0500 02/06/20 0344 02/07/20 0614  Weight: (!) 143.3 kg (!) 144.4 kg (!) 144 kg    Examination:  General exam: Appears calm and comfortable, not in any distress. Respiratory system: Clear to auscultation. Respiratory effort  normal. Cardiovascular system: S1 & S2 heard, RRR. No JVD, murmurs, rubs, gallops or clicks. No pedal edema. Gastrointestinal system: Abdomen is nondistended, soft and nontender. No organomegaly or masses felt. Normal bowel sounds heard. Central nervous system: Alert and oriented x 3 . No focal neurological deficits. Extremities: Right thigh area erythematous warm and tender. Skin: No rashes, lesions or ulcers Psychiatry: Judgement and insight appear normal. Mood & affect appropriate.     Data Reviewed: I have personally reviewed following labs and imaging studies  CBC: Recent Labs  Lab 02/03/20 2351 02/04/20 0654 02/05/20 0321 02/06/20 0325 02/07/20 0330  WBC 20.0* 16.5* 13.6* 9.1 9.8  NEUTROABS 15.8*  --  9.4* 5.5 6.1  HGB 10.8* 11.4* 10.6* 9.4* 11.7*  HCT 35.2* 37.2 34.4* 30.7* 37.0  MCV 91.0 92.1 91.7 91.4 89.2  PLT 472* 455* 470* 490* 229*   Basic Metabolic Panel: Recent Labs  Lab 02/03/20 2351 02/04/20 0654 02/05/20 0321 02/06/20 0325 02/07/20 0330  NA 138 139 140 139 141  K 4.4 4.5 3.9 3.8 3.6  CL 99 98 103 105 106  CO2 27 28 27 24 25   GLUCOSE 118* 88 106* 96 107*  BUN 62* 61* 60* 48* 34*  CREATININE 2.37* 2.19* 1.94* 1.43* 1.18*  CALCIUM 9.9 9.4 9.3 8.9 9.2  MG  --   --  2.1 2.0 1.6*  PHOS  --   --  3.5 3.0 2.5   GFR: Estimated Creatinine Clearance: 64.9 mL/min (A) (by C-G formula based on SCr of 1.18 mg/dL (H)). Liver Function Tests: Recent Labs  Lab 02/03/20 2351 02/05/20 0321 02/06/20 0325 02/07/20 0330  AST 13* 10* 8* 11*  ALT 10 9 7 7   ALKPHOS 118 96 79 79  BILITOT 0.4 0.5 0.4 0.3  PROT 8.1 6.9 6.4* 6.7  ALBUMIN 3.2* 2.7* 2.6* 2.7*   No results for input(s): LIPASE, AMYLASE in the last 168 hours. No results for input(s): AMMONIA in the last 168 hours. Coagulation Profile: Recent Labs  Lab 02/03/20 2351  INR 1.0   Cardiac Enzymes: No results for input(s): CKTOTAL, CKMB, CKMBINDEX, TROPONINI in the last 168 hours. BNP (last 3  results) No results for input(s): PROBNP in the last 8760 hours. HbA1C: No results for input(s): HGBA1C in the last 72 hours. CBG: Recent Labs  Lab 02/06/20 1246 02/06/20 1701 02/06/20 2329 02/07/20 0808 02/07/20 1144  GLUCAP 110* 126* 116* 94 135*   Lipid Profile: No results for input(s): CHOL, HDL, LDLCALC, TRIG, CHOLHDL, LDLDIRECT in the last 72 hours. Thyroid Function Tests: No results for input(s): TSH, T4TOTAL, FREET4, T3FREE, THYROIDAB in the last 72 hours. Anemia Panel: Recent Labs    02/05/20 0321  VITAMINB12 817  FOLATE 8.9  FERRITIN 116  TIBC 282  IRON 63  RETICCTPCT 1.1   Sepsis Labs: Recent Labs  Lab 02/03/20 2351 02/04/20 0654  LATICACIDVEN 0.9 1.4    Recent Results (from the past 240 hour(s))  Blood Culture (routine x 2)     Status: None (Preliminary result)   Collection Time: 02/03/20 11:51 PM   Specimen: BLOOD  Result Value Ref Range Status   Specimen Description   Final    BLOOD RIGHT ANTECUBITAL Performed at [facility], [place] [address] [place], [place] [address]    Special Requests   Final    BOTTLES DRAWN AEROBIC AND ANAEROBIC Blood Culture results may not be optimal due to an excessive volume of blood received in culture bottles Performed at [facility] [address] [place], [place] [address]    Culture   Final    NO GROWTH 3 DAYS Performed at [facility], [place] [address] [place], [place] [address]    Report Status PENDING  Incomplete  Resp Panel by RT-PCR (Flu A&B, Covid) Nasopharyngeal Swab     Status: None   Collection Time: 02/03/20 11:51 PM   Specimen: Nasopharyngeal Swab; Nasopharyngeal(NP) swabs in vial transport medium  Result Value Ref Range Status   SARS Coronavirus 2 by RT PCR NEGATIVE NEGATIVE Final    Comment: (NOTE) SARS-CoV-2 target nucleic acids are NOT DETECTED.  The SARS-CoV-2 RNA is generally detectable in upper respiratory specimens during the acute phase of  infection. The lowest concentration of SARS-CoV-2 viral copies this assay can detect is 138 copies/mL. A negative result does not preclude SARS-Cov-2 infection and should not be used as the sole basis for treatment or other patient management decisions. A negative result may occur with  improper specimen collection/handling, submission of specimen other than nasopharyngeal swab, presence of viral mutation(s) within the areas targeted by this assay, and inadequate number of viral copies(<138 copies/mL). A negative result must be combined with clinical observations, patient history, and epidemiological information. The expected result is Negative.  Fact Sheet for Patients:  EntrepreneurPulse.com.au  Fact Sheet for Healthcare Providers:  IncredibleEmployment.be  This test is no t yet approved or cleared by the [place] FDA and  has been authorized for detection and/or diagnosis of SARS-CoV-2 by FDA under an Emergency Use Authorization (EUA). This EUA will remain  in effect (meaning this test can be used) for the duration of the COVID-19 declaration under Section 564(b)(1) of the Act, 21 U.S.C.section 360bbb-3(b)(1), unless the authorization is terminated  or revoked sooner.       Influenza A by PCR NEGATIVE NEGATIVE Final   Influenza B by PCR NEGATIVE NEGATIVE Final    Comment: (NOTE) The Xpert Xpress SARS-CoV-2/FLU/RSV plus assay is intended as an aid in the diagnosis of influenza from Nasopharyngeal swab specimens and should not be used as a sole basis for treatment. Nasal washings and aspirates are unacceptable for Xpert Xpress SARS-CoV-2/FLU/RSV testing.  Fact Sheet for Patients: EntrepreneurPulse.com.au  Fact Sheet for Healthcare Providers: IncredibleEmployment.be  This test is not yet approved or cleared by the [place] FDA and has been authorized for detection and/or diagnosis of SARS-CoV-2  by FDA under an Emergency Use Authorization (EUA). This EUA will remain in effect (meaning this test can be used) for the duration of the COVID-19 declaration under Section 564(b)(1) of the Act, 21 U.S.C. section 360bbb-3(b)(1), unless the authorization is terminated or revoked.  Performed at [facility], [place] [address] [place], [place] [address]   Urine culture     Status: None   Collection Time: 02/06/20  6:40 AM   Specimen: In/Out Cath Urine  Result Value Ref Range Status   Specimen Description   Final    IN/OUT CATH URINE Performed at [facility] [address] [place], [place] [address]    Special Requests   Final    NONE Performed at [facility], [place] [address], [place], [place] [address]    Culture   Final    NO GROWTH Performed at [facility], [place] [address], [place], [place] [address]    Report Status 02/07/2020 FINAL  Final     Radiology Studies: No results found.  Scheduled Meds:  allopurinol  100 mg Oral BID   atorvastatin  10 mg Oral Daily   busPIRone  5 mg Oral Q8H   DULoxetine  30 mg Oral BID   enoxaparin (LOVENOX) injection  40 mg Subcutaneous Q24H   famotidine  20 mg Oral Daily   gabapentin  600 mg Oral BID   insulin aspart  0-9 Units Subcutaneous TID WC   pantoprazole  40 mg Oral BID   zolpidem  5 mg Oral QHS   Continuous Infusions:  sodium chloride 75 mL/hr at 02/06/20 2051    ceFAZolin (ANCEF) IV 2 g (02/07/20 1424)     LOS: 3 days  Time spent: 25 mins    Shawna Clamp, MD [facility]   If 7PM-7AM, please contact night-coverage

## 2020-02-08 LAB — COMPREHENSIVE METABOLIC PANEL
ALT: 6 U/L (ref 0–44)
AST: 23 U/L (ref 15–41)
Albumin: 2.5 g/dL — ABNORMAL LOW (ref 3.5–5.0)
Alkaline Phosphatase: 75 U/L (ref 38–126)
Anion gap: 10 (ref 5–15)
BUN: 21 mg/dL (ref 8–23)
CO2: 25 mmol/L (ref 22–32)
Calcium: 8.9 mg/dL (ref 8.9–10.3)
Chloride: 105 mmol/L (ref 98–111)
Creatinine, Ser: 0.98 mg/dL (ref 0.44–1.00)
GFR, Estimated: >60 mL/min (ref >60)
Glucose, Bld: 126 mg/dL — ABNORMAL HIGH (ref 70–99)
Potassium: 3.7 mmol/L (ref 3.5–5.1)
Sodium: 140 mmol/L (ref 135–145)
Total Bilirubin: 0.4 mg/dL (ref 0.3–1.2)
Total Protein: 6.4 g/dL — ABNORMAL LOW (ref 6.5–8.1)

## 2020-02-08 LAB — CBC WITH DIFFERENTIAL/PLATELET
Abs Immature Granulocytes: 0.57 10*3/uL — ABNORMAL HIGH (ref 0.00–0.07)
Basophils Absolute: 0.1 10*3/uL (ref 0.0–0.1)
Basophils Relative: 1 %
Eosinophils Absolute: 0.2 10*3/uL (ref 0.0–0.5)
Eosinophils Relative: 2 %
HCT: 30.6 % — ABNORMAL LOW (ref 36.0–46.0)
Hemoglobin: 9.6 g/dL — ABNORMAL LOW (ref 12.0–15.0)
Immature Granulocytes: 6 %
Lymphocytes Relative: 20 %
Lymphs Abs: 2.1 10*3/uL (ref 0.7–4.0)
MCH: 28 pg (ref 26.0–34.0)
MCHC: 31.4 g/dL (ref 30.0–36.0)
MCV: 89.2 fL (ref 80.0–100.0)
Monocytes Absolute: 0.6 10*3/uL (ref 0.1–1.0)
Monocytes Relative: 6 %
Neutro Abs: 6.7 10*3/uL (ref 1.7–7.7)
Neutrophils Relative %: 65 %
Platelets: 554 10*3/uL — ABNORMAL HIGH (ref 150–400)
RBC: 3.43 MIL/uL — ABNORMAL LOW (ref 3.87–5.11)
RDW: 15.7 % — ABNORMAL HIGH (ref 11.5–15.5)
WBC: 10.3 10*3/uL (ref 4.0–10.5)
nRBC: 0.2 % (ref 0.0–0.2)

## 2020-02-08 LAB — GLUCOSE, CAPILLARY
Glucose-Capillary: 112 mg/dL — ABNORMAL HIGH (ref 70–99)
Glucose-Capillary: 109 mg/dL — ABNORMAL HIGH (ref 70–99)
Glucose-Capillary: 115 mg/dL — ABNORMAL HIGH (ref 70–99)
Glucose-Capillary: 104 mg/dL — ABNORMAL HIGH (ref 70–99)

## 2020-02-08 LAB — MAGNESIUM: Magnesium: 1.8 mg/dL (ref 1.7–2.4)

## 2020-02-08 LAB — PHOSPHORUS: Phosphorus: 2.4 mg/dL — ABNORMAL LOW (ref 2.5–4.6)

## 2020-02-08 MED ORDER — SODIUM CHLORIDE 0.9 % IV SOLN: INTRAVENOUS | Status: DC | PRN

## 2020-02-08 NOTE — Progress Notes (Signed)
Subjective/Chief Complaint: home  Denies pain  Wants to go home    Objective: Vital signs in last 24 hours: Temp:  [98 F (36.7 C)-99.8 F (37.7 C)] 98.3 F (36.8 C) (12/18 0612) Pulse Rate:  [91-96] 96 (12/18 0612) Resp:  [16-17] 16 (12/18 0612) BP: (128-134)/(54-60) 131/54 (12/18 0612) SpO2:  [89 %-97 %] 89 % (12/18 0612) Weight:  [144 kg] 144 kg (12/18 0500) Last BM Date: 02/07/20  Intake/Output from previous day: 12/17 0701 - 12/18 0700 In: 775 [I.V.:675; IV Piggyback:100] Out: 1550 [Urine:1550] Intake/Output this shift: Total I/O In: -  Out: 100 [Urine:100]  General: pleasant, WD, morbidly obese female who is laying in bed in NAD MS: RLE with mild erythema and induration medially that is mildly ttp, no obvious fluctuant or drainable collection  Lab Results:  Recent Labs    02/07/20 0330 02/08/20 0253  WBC 9.8 10.3  HGB 11.7* 9.6*  HCT 37.0 30.6*  PLT 509* 554*   BMET Recent Labs    02/07/20 0330 02/08/20 0253  NA 141 140  K 3.6 3.7  CL 106 105  CO2 25 25  GLUCOSE 107* 126*  BUN 34* 21  CREATININE 1.18* 0.98  CALCIUM 9.2 8.9   PT/INR No results for input(s): LABPROT, INR in the last 72 hours. ABG No results for input(s): PHART, HCO3 in the last 72 hours.  Invalid input(s): PCO2, PO2  Studies/Results: [place] RT LOWER EXTREM LTD SOFT TISSUE NON VASCULAR  Result Date: 02/07/2020 CLINICAL DATA:  Cellulitis and abscess of right lower extremity. Per ultrasound tech note: Relevant history includes physician's note asking to check for abscess in the right medial thigh. EXAM: ULTRASOUND right LOWER EXTREMITY LIMITED TECHNIQUE: Ultrasound examination of the lower extremity soft tissues was performed in the area of clinical concern. COMPARISON:  X-ray right femur 02/03/2020 FINDINGS: No soft tissue mass or cyst identified within the right medial thigh and posterior right thigh. No soft tissue mass or cyst identified within the posterior right knee. Pain due  to pressure noted along the posterior right thigh and posterior right knee. Subcutaneus soft tissue edema of the posterior right knee. IMPRESSION: 1. Subcutaneus soft tissue edema of the posterior right knee. 2. No definite abscess identified along the right medial thigh, posterior right thigh, and posterior right knee. Electronically Signed   By: Iven Finn M.D.   On: 02/07/2020 19:42    Anti-infectives: Anti-infectives (From admission, onward)   Start     Dose/Rate Route Frequency Ordered Stop   02/06/20 0600  vancomycin (VANCOREADY) IVPB 1500 mg/300 mL  Status:  Discontinued        1,500 mg 150 mL/hr over 120 Minutes Intravenous Every 48 hours 02/04/20 0906 02/05/20 1410   02/06/20 0600  ceFAZolin (ANCEF) IVPB 2g/100 mL premix        2 g 200 mL/hr over 30 Minutes Intravenous Every 8 hours 02/05/20 1410     02/06/20 0400  vancomycin (VANCOREADY) IVPB 1750 mg/350 mL  Status:  Discontinued        1,750 mg 175 mL/hr over 120 Minutes Intravenous Every 48 hours 02/04/20 0323 02/04/20 0906   02/04/20 1400  cefTRIAXone (ROCEPHIN) 2 g in sodium chloride 0.9 % 100 mL IVPB  Status:  Discontinued        2 g 200 mL/hr over 30 Minutes Intravenous Every 24 hours 02/04/20 0906 02/05/20 1410   02/04/20 1400  metroNIDAZOLE (FLAGYL) tablet 500 mg  Status:  Discontinued  500 mg Oral Every 8 hours 02/04/20 0906 02/05/20 1410   02/04/20 0515  meropenem (MERREM) 1 g in sodium chloride 0.9 % 100 mL IVPB  Status:  Discontinued        1 g 200 mL/hr over 30 Minutes Intravenous Every 12 hours 02/04/20 0429 02/04/20 0906   02/04/20 0415  meropenem (MERREM) 1 g in sodium chloride 0.9 % 100 mL IVPB  Status:  Discontinued        1 g 200 mL/hr over 30 Minutes Intravenous Every 8 hours 02/04/20 0323 02/04/20 0429   02/04/20 0400  vancomycin (VANCOCIN) IVPB 1000 mg/200 mL premix  Status:  Discontinued        1,000 mg 200 mL/hr over 60 Minutes Intravenous  Once 02/04/20 0308 02/04/20 0318   02/04/20 0400   meropenem (MERREM) 1 g in sodium chloride 0.9 % 100 mL IVPB  Status:  Discontinued        1 g 200 mL/hr over 30 Minutes Intravenous  Once 02/04/20 0308 02/04/20 0323   02/04/20 0400  vancomycin (VANCOREADY) IVPB 2000 mg/400 mL        2,000 mg 200 mL/hr over 120 Minutes Intravenous  Once 02/04/20 0318 02/04/20 0521   02/03/20 2300  vancomycin (VANCOREADY) IVPB 2000 mg/400 mL  Status:  Discontinued        2,000 mg 200 mL/hr over 120 Minutes Intravenous  Once 02/03/20 2248 02/04/20 0569      Assessment/Plan: HTN T2DM, uncontrolled Diabetic neuropathy Chronic diastolic CHF Anemia of chronic disease Chronic knee pain  CKD stage III Morbid obesity - BMI 58.06  RLE cellulitis  -NO ABSCESS ON U/S Continue ABX No surgical need at this point  Can be discharged if medically fit  Will sign off for now  Happy to see again if condition changes    LOS: 4 days    Turner Daniels MD  02/08/2020

## 2020-02-08 NOTE — Progress Notes (Signed)
PROGRESS NOTE    [name] [name]  FWY:[id number] DOB: [birth year] DOA: 02/03/2020 PCP: [name], MD    Brief Narrative:  This 64 years old female with PMH of type 2 diabetes uncontrolled with complication, diabetic neuropathy, hypertension, chronic diastolic CHF, chronic anemia, morbid obesity recently admitted 2 months ago for hemorrhagic shock, underwent embolization of the artery, source was upper GI bleed, has been experiencing increasing swelling and redness of right lower extremity for last few days. Patient was admitted for right leg cellulitis and was started on empiric antibiotics.  X-ray does not show any signs of osteomyelitis.  General surgery consulted,  recommended right lower extremity ultrasound to rule out abscess.  Ultrasound negative for abscess.  General surgery signed off.  Patient is from [place],  awaiting insurance authorization.  Assessment & Plan:   Principal Problem:   Lower extremity cellulitis Active Problems:   Type 2 diabetes mellitus with stage 3 chronic kidney disease (HCC)   CKD (chronic kidney disease), stage III (HCC)   Cellulitis   Essential hypertension   Morbid obesity (HCC)   RIGHT lower extremity cellulitis: Patient was found to have right lower extremity cellulitis. X-ray does not show any signs of osteomyelitis. Patient was empirically started on antibiotics. Blood cultures negative so far. Patient does not meet criteria for sepsis. Lactic acid 0.9. Bilateral lower extremity Doppler negative DVT. If there is high suspicion obtain MRI of the right extremity. Antibiotics narrowed down to Ancef. Leg cellulitis slowly improving but continued to remain persistent. General surgery consulted,  recommended right lower extremity ultrasound to rule out abscess. Right lower extremity ultrasound negative for abscess.  General surgery recommended continue antibiotics. Transition to oral antibiotics  Bactrim DS at discharge.   DM  type II controlled with complication/DM neuropathy -12/14 hemoglobin A1c= 6.8 -Sensitive SSI.  Acute on CKD stage III (baseline Cr 1.42) -Likely multifactorial infection, diabetes, hypertension, -Hold all renal toxic medication. - Renal functions improving.   Chronic diastolic CHF -Strict in and out put charting -Daily weight -Normal saline 69ml/hr. -Labetalol PRN . Echo: 10/21: LVEF 60-65%   Essential HTN Resume home medications.  Morbid obesity -Patient would benefit from weight loss program  Anemia unspecified -Recent history hemorrhagic shock from upper GI bleed requiring embolization and multiple blood transfusions (8 units) Hemoglobin is stable 10.8.    DVT prophylaxis:  Lovenox Code Status: Full code Family Communication:  No family at bed side. Disposition Plan:   Status is: Inpatient  Remains inpatient appropriate because:Inpatient level of care appropriate due to severity of illness   Dispo: The patient is from: Home              Anticipated d/c is to: SNF              Anticipated d/c date is:1-2 days              Patient currently is medically clear for discharge.  Consultants:   None  Procedures:  None Antimicrobials:   Anti-infectives (From admission, onward)   Start     Dose/Rate Route Frequency Ordered Stop   02/06/20 0600  vancomycin (VANCOREADY) IVPB 1500 mg/300 mL  Status:  Discontinued        1,500 mg 150 mL/hr over 120 Minutes Intravenous Every 48 hours 02/04/20 0906 02/05/20 1410   02/06/20 0600  ceFAZolin (ANCEF) IVPB 2g/100 mL premix        2 g 200 mL/hr over 30 Minutes Intravenous Every 8 hours 02/05/20 1410  02/06/20 0400  vancomycin (VANCOREADY) IVPB 1750 mg/350 mL  Status:  Discontinued        1,750 mg 175 mL/hr over 120 Minutes Intravenous Every 48 hours 02/04/20 0323 02/04/20 0906   02/04/20 1400  cefTRIAXone (ROCEPHIN) 2 g in sodium chloride 0.9 % 100 mL IVPB  Status:  Discontinued        2 g 200 mL/hr over 30  Minutes Intravenous Every 24 hours 02/04/20 0906 02/05/20 1410   02/04/20 1400  metroNIDAZOLE (FLAGYL) tablet 500 mg  Status:  Discontinued        500 mg Oral Every 8 hours 02/04/20 0906 02/05/20 1410   02/04/20 0515  meropenem (MERREM) 1 g in sodium chloride 0.9 % 100 mL IVPB  Status:  Discontinued        1 g 200 mL/hr over 30 Minutes Intravenous Every 12 hours 02/04/20 0429 02/04/20 0906   02/04/20 0415  meropenem (MERREM) 1 g in sodium chloride 0.9 % 100 mL IVPB  Status:  Discontinued        1 g 200 mL/hr over 30 Minutes Intravenous Every 8 hours 02/04/20 0323 02/04/20 0429   02/04/20 0400  vancomycin (VANCOCIN) IVPB 1000 mg/200 mL premix  Status:  Discontinued        1,000 mg 200 mL/hr over 60 Minutes Intravenous  Once 02/04/20 0308 02/04/20 0318   02/04/20 0400  meropenem (MERREM) 1 g in sodium chloride 0.9 % 100 mL IVPB  Status:  Discontinued        1 g 200 mL/hr over 30 Minutes Intravenous  Once 02/04/20 0308 02/04/20 0323   02/04/20 0400  vancomycin (VANCOREADY) IVPB 2000 mg/400 mL        2,000 mg 200 mL/hr over 120 Minutes Intravenous  Once 02/04/20 0318 02/04/20 0521   02/03/20 2300  vancomycin (VANCOREADY) IVPB 2000 mg/400 mL  Status:  Discontinued        2,000 mg 200 mL/hr over 120 Minutes Intravenous  Once 02/03/20 2248 02/04/20 1610     Subjective: Patient was seen and examined at bedside. No overnight events.   Patient reports feeling better, She is Alert and oriented x 3.  Right leg is still erythematous,  warm and tender. Patient seems very frustrated with insurance process.  She wants to go home.   Objective: Vitals:   02/07/20 1454 02/07/20 2327 02/08/20 0500 02/08/20 0612  BP: (!) 134/58 128/60  (!) 131/54  Pulse: 91 95  96  Resp: 17 16  16   Temp: 98 F (36.7 C) 99.8 F (37.7 C)  98.3 F (36.8 C)  TempSrc: Oral Oral  Oral  SpO2: 95% 97%  (!) 89%  Weight:   (!) 144 kg   Height:        Intake/Output Summary (Last 24 hours) at 02/08/2020 1524 Last data  filed at 02/08/2020 1216 Gross per 24 hour  Intake 1135 ml  Output 1850 ml  Net -715 ml   Filed Weights   02/06/20 0344 02/07/20 0614 02/08/20 0500  Weight: (!) 144.4 kg (!) 144 kg (!) 144 kg    Examination:  General exam: Appears calm and comfortable, not in any distress. Respiratory system: Clear to auscultation. Respiratory effort normal. Cardiovascular system: S1 & S2 heard, RRR. No JVD, murmurs, rubs, gallops or clicks. No pedal edema. Gastrointestinal system: Abdomen is nondistended, soft and nontender. No organomegaly or masses felt. Normal bowel sounds heard. Central nervous system: Alert and oriented x 3 . No focal neurological deficits. Extremities: Right thigh  area erythematous warm and tender. Skin: No rashes, lesions or ulcers Psychiatry: Judgement and insight appear normal. Mood & affect appropriate.     Data Reviewed: I have personally reviewed following labs and imaging studies  CBC: Recent Labs  Lab 02/03/20 2351 02/04/20 0654 02/05/20 0321 02/06/20 0325 02/07/20 0330 02/08/20 0253  WBC 20.0* 16.5* 13.6* 9.1 9.8 10.3  NEUTROABS 15.8*  --  9.4* 5.5 6.1 6.7  HGB 10.8* 11.4* 10.6* 9.4* 11.7* 9.6*  HCT 35.2* 37.2 34.4* 30.7* 37.0 30.6*  MCV 91.0 92.1 91.7 91.4 89.2 89.2  PLT 472* 455* 470* 490* 509* 287*   Basic Metabolic Panel: Recent Labs  Lab 02/04/20 0654 02/05/20 0321 02/06/20 0325 02/07/20 0330 02/08/20 0253  NA 139 140 139 141 140  K 4.5 3.9 3.8 3.6 3.7  CL 98 103 105 106 105  CO2 28 27 24 25 25   GLUCOSE 88 106* 96 107* 126*  BUN 61* 60* 48* 34* 21  CREATININE 2.19* 1.94* 1.43* 1.18* 0.98  CALCIUM 9.4 9.3 8.9 9.2 8.9  MG  --  2.1 2.0 1.6* 1.8  PHOS  --  3.5 3.0 2.5 2.4*   GFR: Estimated Creatinine Clearance: 78.2 mL/min (by C-G formula based on SCr of 0.98 mg/dL). Liver Function Tests: Recent Labs  Lab 02/03/20 2351 02/05/20 0321 02/06/20 0325 02/07/20 0330 02/08/20 0253  AST 13* 10* 8* 11* 23  ALT 10 9 7 7 6   ALKPHOS 118 96  79 79 75  BILITOT 0.4 0.5 0.4 0.3 0.4  PROT 8.1 6.9 6.4* 6.7 6.4*  ALBUMIN 3.2* 2.7* 2.6* 2.7* 2.5*   No results for input(s): LIPASE, AMYLASE in the last 168 hours. No results for input(s): AMMONIA in the last 168 hours. Coagulation Profile: Recent Labs  Lab 02/03/20 2351  INR 1.0   Cardiac Enzymes: No results for input(s): CKTOTAL, CKMB, CKMBINDEX, TROPONINI in the last 168 hours. BNP (last 3 results) No results for input(s): PROBNP in the last 8760 hours. HbA1C: No results for input(s): HGBA1C in the last 72 hours. CBG: Recent Labs  Lab 02/07/20 1144 02/07/20 1719 02/07/20 2328 02/08/20 0742 02/08/20 1210  GLUCAP 135* 111* 125* 112* 109*   Lipid Profile: No results for input(s): CHOL, HDL, LDLCALC, TRIG, CHOLHDL, LDLDIRECT in the last 72 hours. Thyroid Function Tests: No results for input(s): TSH, T4TOTAL, FREET4, T3FREE, THYROIDAB in the last 72 hours. Anemia Panel: No results for input(s): VITAMINB12, FOLATE, FERRITIN, TIBC, IRON, RETICCTPCT in the last 72 hours. Sepsis Labs: Recent Labs  Lab 02/03/20 2351 02/04/20 0654  LATICACIDVEN 0.9 1.4    Recent Results (from the past 240 hour(s))  Blood Culture (routine x 2)     Status: None (Preliminary result)   Collection Time: 02/03/20 11:51 PM   Specimen: BLOOD  Result Value Ref Range Status   Specimen Description   Final    BLOOD RIGHT ANTECUBITAL Performed at [facility] [address], [place], [place] [address]    Special Requests   Final    BOTTLES DRAWN AEROBIC AND ANAEROBIC Blood Culture results may not be optimal due to an excessive volume of blood received in culture bottles Performed at [facility] [address], [place], [place] [address]    Culture   Final    NO GROWTH 4 DAYS Performed at [facility], [place] [address], [place], [place] [address]    Report Status PENDING  Incomplete  Resp Panel by RT-PCR (Flu A&B, Covid) Nasopharyngeal Swab  Status: None   Collection Time: 02/03/20 11:51 PM   Specimen: Nasopharyngeal Swab; Nasopharyngeal(NP) swabs in vial transport medium  Result Value Ref Range Status   SARS Coronavirus 2 by RT PCR NEGATIVE NEGATIVE Final    Comment: (NOTE) SARS-CoV-2 target nucleic acids are NOT DETECTED.  The SARS-CoV-2 RNA is generally detectable in upper respiratory specimens during the acute phase of infection. The lowest concentration of SARS-CoV-2 viral copies this assay can detect is 138 copies/mL. A negative result does not preclude SARS-Cov-2 infection and should not be used as the sole basis for treatment or other patient management decisions. A negative result may occur with  improper specimen collection/handling, submission of specimen other than nasopharyngeal swab, presence of viral mutation(s) within the areas targeted by this assay, and inadequate number of viral copies(<138 copies/mL). A negative result must be combined with clinical observations, patient history, and epidemiological information. The expected result is Negative.  Fact Sheet for Patients:  EntrepreneurPulse.com.au  Fact Sheet for Healthcare Providers:  IncredibleEmployment.be  This test is no t yet approved or cleared by the [place] FDA and  has been authorized for detection and/or diagnosis of SARS-CoV-2 by FDA under an Emergency Use Authorization (EUA). This EUA will remain  in effect (meaning this test can be used) for the duration of the COVID-19 declaration under Section 564(b)(1) of the Act, 21 U.S.C.section 360bbb-3(b)(1), unless the authorization is terminated  or revoked sooner.       Influenza A by PCR NEGATIVE NEGATIVE Final   Influenza B by PCR NEGATIVE NEGATIVE Final    Comment: (NOTE) The Xpert Xpress SARS-CoV-2/FLU/RSV plus assay is intended as an aid in the diagnosis of influenza from Nasopharyngeal swab specimens and should not be used as a sole basis  for treatment. Nasal washings and aspirates are unacceptable for Xpert Xpress SARS-CoV-2/FLU/RSV testing.  Fact Sheet for Patients: EntrepreneurPulse.com.au  Fact Sheet for Healthcare Providers: IncredibleEmployment.be  This test is not yet approved or cleared by the [place] FDA and has been authorized for detection and/or diagnosis of SARS-CoV-2 by FDA under an Emergency Use Authorization (EUA). This EUA will remain in effect (meaning this test can be used) for the duration of the COVID-19 declaration under Section 564(b)(1) of the Act, 21 U.S.C. section 360bbb-3(b)(1), unless the authorization is terminated or revoked.  Performed at [facility], [place] [address] [place], [place] [address]   Urine culture     Status: None   Collection Time: 02/06/20  6:40 AM   Specimen: In/Out Cath Urine  Result Value Ref Range Status   Specimen Description   Final    IN/OUT CATH URINE Performed at [facility] [address], [place], [place] [address]    Special Requests   Final    NONE Performed at [facility], [place] [address], [place], [place] [address]    Culture   Final    NO GROWTH Performed at [facility], [place] [address], [place], [place] [address]    Report Status 02/07/2020 FINAL  Final     Radiology Studies: [place] RT LOWER EXTREM LTD SOFT TISSUE NON VASCULAR  Result Date: 02/07/2020 CLINICAL DATA:  Cellulitis and abscess of right lower extremity. Per ultrasound tech note: Relevant history includes physician's note asking to check for abscess in the right medial thigh. EXAM: ULTRASOUND right LOWER EXTREMITY LIMITED TECHNIQUE: Ultrasound examination of the lower extremity soft tissues was performed in the area of clinical concern. COMPARISON:  X-ray right  femur 02/03/2020 FINDINGS: No soft tissue mass or cyst identified within the right medial thigh and posterior right thigh. No  soft tissue mass or cyst identified within the posterior right knee. Pain due to pressure noted along the posterior right thigh and posterior right knee. Subcutaneus soft tissue edema of the posterior right knee. IMPRESSION: 1. Subcutaneus soft tissue edema of the posterior right knee. 2. No definite abscess identified along the right medial thigh, posterior right thigh, and posterior right knee. Electronically Signed   By: Iven Finn M.D.   On: 02/07/2020 19:42    Scheduled Meds: . allopurinol  100 mg Oral BID  . atorvastatin  10 mg Oral Daily  . busPIRone  5 mg Oral Q8H  . DULoxetine  30 mg Oral BID  . enoxaparin (LOVENOX) injection  40 mg Subcutaneous Q24H  . famotidine  20 mg Oral Daily  . gabapentin  600 mg Oral BID  . insulin aspart  0-9 Units Subcutaneous TID WC  . melatonin  5 mg Oral QHS  . pantoprazole  40 mg Oral BID  . zolpidem  5 mg Oral QHS   Continuous Infusions: . sodium chloride 75 mL/hr at 02/08/20 1211  .  ceFAZolin (ANCEF) IV 2 g (02/08/20 1508)     LOS: 4 days    Time spent: 25 mins    Christhoper Busbee, MD [facility]   If 7PM-7AM, please contact night-coverage

## 2020-02-08 NOTE — Progress Notes (Signed)
Patient refusing all morning medications. She is stating she wants to be discharged. Informed hospitalist.

## 2020-02-08 NOTE — Plan of Care (Signed)
  Problem: Education: Goal: Knowledge of General Education information will improve Description: Including pain rating scale, medication(s)/side effects and non-pharmacologic comfort measures Outcome: Progressing   Problem: Health Behavior/Discharge Planning: Goal: Ability to manage health-related needs will improve Outcome: Progressing   Problem: Clinical Measurements: Goal: Will remain free from infection Outcome: Progressing   

## 2020-02-09 LAB — COMPREHENSIVE METABOLIC PANEL
ALT: 8 U/L (ref 0–44)
AST: 21 U/L (ref 15–41)
Albumin: 2.6 g/dL — ABNORMAL LOW (ref 3.5–5.0)
Alkaline Phosphatase: 69 U/L (ref 38–126)
Anion gap: 11 (ref 5–15)
BUN: 13 mg/dL (ref 8–23)
CO2: 26 mmol/L (ref 22–32)
Calcium: 9 mg/dL (ref 8.9–10.3)
Chloride: 105 mmol/L (ref 98–111)
Creatinine, Ser: 0.83 mg/dL (ref 0.44–1.00)
GFR, Estimated: >60 mL/min (ref >60)
Glucose, Bld: 89 mg/dL (ref 70–99)
Potassium: 3.4 mmol/L — ABNORMAL LOW (ref 3.5–5.1)
Sodium: 142 mmol/L (ref 135–145)
Total Bilirubin: 0.3 mg/dL (ref 0.3–1.2)
Total Protein: 6.4 g/dL — ABNORMAL LOW (ref 6.5–8.1)

## 2020-02-09 LAB — CBC WITH DIFFERENTIAL/PLATELET
Abs Immature Granulocytes: 0.41 10*3/uL — ABNORMAL HIGH (ref 0.00–0.07)
Basophils Absolute: 0.1 10*3/uL (ref 0.0–0.1)
Basophils Relative: 1 %
Eosinophils Absolute: 0.2 10*3/uL (ref 0.0–0.5)
Eosinophils Relative: 2 %
HCT: 30.4 % — ABNORMAL LOW (ref 36.0–46.0)
Hemoglobin: 9.8 g/dL — ABNORMAL LOW (ref 12.0–15.0)
Immature Granulocytes: 4 %
Lymphocytes Relative: 21 %
Lymphs Abs: 2.1 10*3/uL (ref 0.7–4.0)
MCH: 28.2 pg (ref 26.0–34.0)
MCHC: 32.2 g/dL (ref 30.0–36.0)
MCV: 87.4 fL (ref 80.0–100.0)
Monocytes Absolute: 0.6 10*3/uL (ref 0.1–1.0)
Monocytes Relative: 6 %
Neutro Abs: 6.7 10*3/uL (ref 1.7–7.7)
Neutrophils Relative %: 66 %
Platelets: 540 10*3/uL — ABNORMAL HIGH (ref 150–400)
RBC: 3.48 MIL/uL — ABNORMAL LOW (ref 3.87–5.11)
RDW: 15.4 % (ref 11.5–15.5)
WBC: 10.1 10*3/uL (ref 4.0–10.5)
nRBC: 0 % (ref 0.0–0.2)

## 2020-02-09 LAB — GLUCOSE, CAPILLARY
Glucose-Capillary: 87 mg/dL (ref 70–99)
Glucose-Capillary: 111 mg/dL — ABNORMAL HIGH (ref 70–99)
Glucose-Capillary: 150 mg/dL — ABNORMAL HIGH (ref 70–99)
Glucose-Capillary: 126 mg/dL — ABNORMAL HIGH (ref 70–99)

## 2020-02-09 LAB — PHOSPHORUS: Phosphorus: 2.9 mg/dL (ref 2.5–4.6)

## 2020-02-09 LAB — CULTURE, BLOOD (ROUTINE X 2): Culture: NO GROWTH

## 2020-02-09 LAB — MAGNESIUM: Magnesium: 1.4 mg/dL — ABNORMAL LOW (ref 1.7–2.4)

## 2020-02-09 MED ORDER — POTASSIUM CHLORIDE CRYS ER 20 MEQ PO TBCR: 20.0000 meq | EXTENDED_RELEASE_TABLET | Freq: Once | ORAL | Status: DC

## 2020-02-09 MED ORDER — POTASSIUM CHLORIDE CRYS ER 20 MEQ PO TBCR
20.0000 meq | EXTENDED_RELEASE_TABLET | Freq: Once | ORAL | Status: AC
  Administered 2020-02-09: 12:00:00 20 meq via ORAL
  Filled 2020-02-09: qty 1

## 2020-02-09 NOTE — Progress Notes (Signed)
[facility]  PROGRESS NOTE  DELONDA COLEY ZOX:[id number] DOB: 03/25/1955 DOA: 02/03/2020 PCP: [name], MD   Brief HPI:   64 year old female with past medical history of diabetes mellitus type 2, diabetic neuropathy, hypertension, chronic diastolic CHF, chronic anemia, morbid obesity who was recently mated 2 months ago for hemorrhagic shock, underwent embolization of the artery, source was upper GI bleed.  She has been experiencing increased swelling and redness of right lower extremity for past few days.  She was noted for right leg cellulitis and started on empiric antibiotics.  Bilateral venous duplex was negative for DVT.  General surgery was consulted and recommended ultrasound to rule out abscess.  Ultrasound was negative for abscess.  General surgery has signed off.  Patient awaiting insurance authorization at skilled nursing facility.    Subjective   Patient seen and examined, denies any pain.   Assessment/Plan:     1. Right lower extremity cellulitis-x-ray did not show osteomyelitis.  Patient started on empiric antibiotics.  Lactic acid 0.9.  Bilateral venous duplex of lower extremities negative for DVT.  Antibiotics were broadened to Ancef.   Right lower extremity ultrasound was negative for abscess.  General surgery recommended to continue antibiotics.  Transition to Bactrim DS on discharge. 2. Diabetes mellitus type 2-blood glucose well controlled, continue sliding scale insulin NovoLog. 3. Acute kidney injury on CKD stage III-Ejiro dehydration, resolved.  Currently creatinine 0.94. 4. Chronic diastolic CHF-currently euvolemic. 5. Hypertension-continue home medications 6. Anemia-recent history of hemorrhagic shock from upper GI bleed requiring embolization and multiple blood transfusion.  Hemoglobin is currently stable at 9.8.     COVID-19 Labs  No results for input(s): DDIMER, FERRITIN, LDH, CRP in the last 72 hours.  Lab Results  Component Value  Date   SARSCOV2NAA NEGATIVE 02/03/2020   [place] NEGATIVE 11/21/2019   [place] NEGATIVE 11/05/2019   [place] NEGATIVE 10/29/2019     Scheduled medications:   . allopurinol  100 mg Oral BID  . atorvastatin  10 mg Oral Daily  . busPIRone  5 mg Oral Q8H  . DULoxetine  30 mg Oral BID  . enoxaparin (LOVENOX) injection  40 mg Subcutaneous Q24H  . famotidine  20 mg Oral Daily  . gabapentin  600 mg Oral BID  . insulin aspart  0-9 Units Subcutaneous TID WC  . melatonin  5 mg Oral QHS  . pantoprazole  40 mg Oral BID  . zolpidem  5 mg Oral QHS         CBG: Recent Labs  Lab 02/08/20 1710 02/08/20 2151 02/09/20 0746 02/09/20 1132 02/09/20 1618  GLUCAP 115* 104* 87 111* 150*    SpO2: 95 % O2 Flow Rate (L/min): 2 L/min    CBC: Recent Labs  Lab 02/05/20 0321 02/06/20 0325 02/07/20 0330 02/08/20 0253 02/09/20 0249  WBC 13.6* 9.1 9.8 10.3 10.1  NEUTROABS 9.4* 5.5 6.1 6.7 6.7  HGB 10.6* 9.4* 11.7* 9.6* 9.8*  HCT 34.4* 30.7* 37.0 30.6* 30.4*  MCV 91.7 91.4 89.2 89.2 87.4  PLT 470* 490* 509* 554* 540*    Basic Metabolic Panel: Recent Labs  Lab 02/05/20 0321 02/06/20 0325 02/07/20 0330 02/08/20 0253 02/09/20 0249  NA 140 139 141 140 142  K 3.9 3.8 3.6 3.7 3.4*  CL 103 105 106 105 105  CO2 27 24 25 25 26   GLUCOSE 106* 96 107* 126* 89  BUN 60* 48* 34* 21 13  CREATININE 1.94* 1.43* 1.18* 0.98 0.83  CALCIUM 9.3 8.9 9.2 8.9 9.0  MG 2.1 2.0 1.6* 1.8 1.4*  PHOS 3.5 3.0 2.5 2.4* 2.9     Liver Function Tests: Recent Labs  Lab 02/05/20 0321 02/06/20 0325 02/07/20 0330 02/08/20 0253 02/09/20 0249  AST 10* 8* 11* 23 21  ALT 9 7 7 6 8   ALKPHOS 96 79 79 75 69  BILITOT 0.5 0.4 0.3 0.4 0.3  PROT 6.9 6.4* 6.7 6.4* 6.4*  ALBUMIN 2.7* 2.6* 2.7* 2.5* 2.6*     Antibiotics: Anti-infectives (From admission, onward)   Start     Dose/Rate Route Frequency Ordered Stop   02/06/20 0600  vancomycin (VANCOREADY) IVPB 1500 mg/300 mL  Status:  Discontinued         1,500 mg 150 mL/hr over 120 Minutes Intravenous Every 48 hours 02/04/20 0906 02/05/20 1410   02/06/20 0600  ceFAZolin (ANCEF) IVPB 2g/100 mL premix        2 g 200 mL/hr over 30 Minutes Intravenous Every 8 hours 02/05/20 1410     02/06/20 0400  vancomycin (VANCOREADY) IVPB 1750 mg/350 mL  Status:  Discontinued        1,750 mg 175 mL/hr over 120 Minutes Intravenous Every 48 hours 02/04/20 0323 02/04/20 0906   02/04/20 1400  cefTRIAXone (ROCEPHIN) 2 g in sodium chloride 0.9 % 100 mL IVPB  Status:  Discontinued        2 g 200 mL/hr over 30 Minutes Intravenous Every 24 hours 02/04/20 0906 02/05/20 1410   02/04/20 1400  metroNIDAZOLE (FLAGYL) tablet 500 mg  Status:  Discontinued        500 mg Oral Every 8 hours 02/04/20 0906 02/05/20 1410   02/04/20 0515  meropenem (MERREM) 1 g in sodium chloride 0.9 % 100 mL IVPB  Status:  Discontinued        1 g 200 mL/hr over 30 Minutes Intravenous Every 12 hours 02/04/20 0429 02/04/20 0906   02/04/20 0415  meropenem (MERREM) 1 g in sodium chloride 0.9 % 100 mL IVPB  Status:  Discontinued        1 g 200 mL/hr over 30 Minutes Intravenous Every 8 hours 02/04/20 0323 02/04/20 0429   02/04/20 0400  vancomycin (VANCOCIN) IVPB 1000 mg/200 mL premix  Status:  Discontinued        1,000 mg 200 mL/hr over 60 Minutes Intravenous  Once 02/04/20 0308 02/04/20 0318   02/04/20 0400  meropenem (MERREM) 1 g in sodium chloride 0.9 % 100 mL IVPB  Status:  Discontinued        1 g 200 mL/hr over 30 Minutes Intravenous  Once 02/04/20 0308 02/04/20 0323   02/04/20 0400  vancomycin (VANCOREADY) IVPB 2000 mg/400 mL        2,000 mg 200 mL/hr over 120 Minutes Intravenous  Once 02/04/20 0318 02/04/20 0521   02/03/20 2300  vancomycin (VANCOREADY) IVPB 2000 mg/400 mL  Status:  Discontinued        2,000 mg 200 mL/hr over 120 Minutes Intravenous  Once 02/03/20 2248 02/04/20 0312       DVT prophylaxis: Lovenox  Code Status: Full code  Family Communication: No family at  bedside   Consultants:  General surgery  Procedures:      Objective   Vitals:   02/08/20 2149 02/09/20 0404 02/09/20 0500 02/09/20 1403  BP: 137/61  (!) 135/54 127/63  Pulse: 91  95 96  Resp: 19  16 20   Temp: 98.7 F (37.1 C)  98.8 F (37.1 C) 98.1 F (36.7 C)  TempSrc:   Oral  Oral  SpO2: 97%  97% 95%  Weight:  (!) 143.2 kg    Height:        Intake/Output Summary (Last 24 hours) at 02/09/2020 1719 Last data filed at 02/09/2020 1519 Gross per 24 hour  Intake 704.96 ml  Output 1050 ml  Net -345.04 ml    12/17 1901 - 12/19 0700 In: 1549.6 [P.O.:390; I.V.:764.8] Out: 2900 [Urine:2900]  Filed Weights   02/07/20 0614 02/08/20 0500 02/09/20 0404  Weight: (!) 144 kg (!) 144 kg (!) 143.2 kg    Physical Examination:   General-appears in no acute distress Heart-S1-S2, regular, no murmur auscultated Lungs-clear to auscultation bilaterally, no wheezing or crackles auscultated Abdomen-soft, nontender, no organomegaly Extremities-no edema in the lower extremities Neuro-alert, oriented x3, no focal deficit noted  Status is: Inpatient  Dispo: The patient is from: Skilled nursing facility              Anticipated d/c is to: Skilled nursing facility              Anticipated d/c date is: 02/10/2020              Patient currently medically stable for discharge  Barrier to discharge-awaiting bed at skilled nursing facility     Data Reviewed:   Recent Results (from the past 240 hour(s))  Blood Culture (routine x 2)     Status: None   Collection Time: 02/03/20 11:51 PM   Specimen: BLOOD  Result Value Ref Range Status   Specimen Description   Final    BLOOD RIGHT ANTECUBITAL Performed at [facility] [address], [place], [place] [address]    Special Requests   Final    BOTTLES DRAWN AEROBIC AND ANAEROBIC Blood Culture results may not be optimal due to an excessive volume of blood received in culture bottles Performed at [facility] [address], [place], [place] [address]    Culture   Final    NO GROWTH 5 DAYS Performed at [facility], [place] [address], [place],  [address]    Report Status 02/09/2020 FINAL  Final  Resp Panel by RT-PCR (Flu A&B, Covid) Nasopharyngeal Swab     Status: None   Collection Time: 02/03/20 11:51 PM   Specimen: Nasopharyngeal Swab; Nasopharyngeal(NP) swabs in vial transport medium  Result Value Ref Range Status   SARS Coronavirus 2 by RT PCR NEGATIVE NEGATIVE Final    Comment: (NOTE) SARS-CoV-2 target nucleic acids are NOT DETECTED.  The SARS-CoV-2 RNA is generally detectable in upper respiratory specimens during the acute phase of infection. The lowest concentration of SARS-CoV-2 viral copies this assay can detect is 138 copies/mL. A negative result does not preclude SARS-Cov-2 infection and should not be used as the sole basis for treatment or other patient management decisions. A negative result may occur with  improper specimen collection/handling, submission of specimen other than nasopharyngeal swab, presence of viral mutation(s) within the areas targeted by this assay, and inadequate number of viral copies(<138 copies/mL). A negative result must be combined with clinical observations, patient history, and epidemiological information. The expected result is Negative.  Fact Sheet for Patients:  EntrepreneurPulse.com.au  Fact Sheet for Healthcare Providers:  IncredibleEmployment.be  This test is no t yet approved or cleared by the [place] FDA and  has been authorized for detection and/or diagnosis of SARS-CoV-2 by FDA under an Emergency Use Authorization (EUA). This EUA will remain  in effect (meaning this test can be  used) for the duration of the COVID-19 declaration under Section 564(b)(1) of the Act, 21 U.S.C.section 360bbb-3(b)(1), unless the authorization is terminated  or revoked sooner.        Influenza A by PCR NEGATIVE NEGATIVE Final   Influenza B by PCR NEGATIVE NEGATIVE Final    Comment: (NOTE) The Xpert Xpress SARS-CoV-2/FLU/RSV plus assay is intended as an aid in the diagnosis of influenza from Nasopharyngeal swab specimens and should not be used as a sole basis for treatment. Nasal washings and aspirates are unacceptable for Xpert Xpress SARS-CoV-2/FLU/RSV testing.  Fact Sheet for Patients: EntrepreneurPulse.com.au  Fact Sheet for Healthcare Providers: IncredibleEmployment.be  This test is not yet approved or cleared by the [place] FDA and has been authorized for detection and/or diagnosis of SARS-CoV-2 by FDA under an Emergency Use Authorization (EUA). This EUA will remain in effect (meaning this test can be used) for the duration of the COVID-19 declaration under Section 564(b)(1) of the Act, 21 U.S.C. section 360bbb-3(b)(1), unless the authorization is terminated or revoked.  Performed at [facility], [place] [address] [place], [place] [address]   Urine culture     Status: None   Collection Time: 02/06/20  6:40 AM   Specimen: In/Out Cath Urine  Result Value Ref Range Status   Specimen Description   Final    IN/OUT CATH URINE Performed at [facility] [address], [place], [place] [address]    Special Requests   Final    NONE Performed at [facility], [place] [address], [place], [place] [address]    Culture   Final    NO GROWTH Performed at [facility], [place] [address], [place], [place] [address]    Report Status 02/07/2020 FINAL  Final    No results for input(s): LIPASE, AMYLASE in the last 168 hours. No results for input(s): AMMONIA in the last 168 hours.  Cardiac Enzymes: No results for input(s): CKTOTAL, CKMB, CKMBINDEX, TROPONINI in the last 168 hours. BNP (last 3 results) Recent Labs    08/20/19 1250 11/05/19 2216 11/22/19 1417   BNP 511.0* 608.6* 333.7*         [place]   [facility] If 7PM-7AM, please contact night-coverage at [email or web address], Office  [phone]   02/09/2020, 5:19 PM  LOS: 5 days

## 2020-02-09 NOTE — Plan of Care (Signed)
  Problem: Health Behavior/Discharge Planning: Goal: Ability to manage health-related needs will improve Outcome: Progressing   Problem: Pain Managment: Goal: General experience of comfort will improve Outcome: Progressing   Problem: Skin Integrity: Goal: Skin integrity will improve Outcome: Progressing

## 2020-02-10 LAB — GLUCOSE, CAPILLARY
Glucose-Capillary: 34 mg/dL — CL (ref 70–99)
Glucose-Capillary: 99 mg/dL (ref 70–99)
Glucose-Capillary: 96 mg/dL (ref 70–99)
Glucose-Capillary: 146 mg/dL — ABNORMAL HIGH (ref 70–99)
Glucose-Capillary: 209 mg/dL — ABNORMAL HIGH (ref 70–99)

## 2020-02-10 LAB — BASIC METABOLIC PANEL
Anion gap: 11 (ref 5–15)
BUN: 9 mg/dL (ref 8–23)
CO2: 26 mmol/L (ref 22–32)
Calcium: 9 mg/dL (ref 8.9–10.3)
Chloride: 105 mmol/L (ref 98–111)
Creatinine, Ser: 0.83 mg/dL (ref 0.44–1.00)
GFR, Estimated: >60 mL/min (ref >60)
Glucose, Bld: 89 mg/dL (ref 70–99)
Potassium: 3.5 mmol/L (ref 3.5–5.1)
Sodium: 142 mmol/L (ref 135–145)

## 2020-02-10 LAB — MAGNESIUM: Magnesium: 1.3 mg/dL — ABNORMAL LOW (ref 1.7–2.4)

## 2020-02-10 LAB — PHOSPHORUS: Phosphorus: 3.4 mg/dL (ref 2.5–4.6)

## 2020-02-10 MED ORDER — LOPERAMIDE HCL 2 MG PO CAPS
2.0000 mg | ORAL_CAPSULE | ORAL | Status: DC | PRN
  Administered 2020-02-10 (×2): 2 mg via ORAL
  Filled 2020-02-10 (×2): qty 1

## 2020-02-10 NOTE — TOC Progression Note (Signed)
Transition of Care [facility]) - Progression Note    Patient Details  Name: [name] [name] MRN: [id number] Date of Birth: 08-04-1955  Transition of Care [facility]) CM/SW [place], [place] Phone Number:  02/10/2020, 10:04 AM  Clinical Narrative:   CSW reached out to SNF this am to check on the [facility] authorization. Per the admissions director Loma Boston, the authorization status is still pending. CSW notified the patient. Patient express frustration with having to wait on the authorization and prefers to go home. Patient unable to care for herself at home and does not have a safe plan to transition home. Patient expressed "I am just really ill about this whole thing, I want to be home for Christmas." CSW provided emotional support.  CSW will notify the team when [facility] authorization is received.    Expected Discharge Plan: Skilled Nursing Facility Barriers to Discharge: Insurance Authorization  Expected Discharge Plan and Services Expected Discharge Plan: [place] In-house Referral: Clinical Social Work Discharge Planning Services: CM Consult Post Acute Care Choice: [place] Living arrangements for the past 2 months: [place]                                       Social Determinants of Health (SDOH) Interventions    Readmission Risk Interventions Readmission Risk Prevention Plan 02/06/2020 11/08/2019  Transportation Screening Complete Complete  Medication Review Press photographer) - Referral to Pharmacy  PCP or Specialist appointment within 3-5 days of discharge Complete Complete  HRI or [place] - Complete  SW Recovery Care/Counseling Consult Complete Complete  Palliative Care Screening Not Applicable Not Applicable  Skilled Nursing Facility Complete Complete  Some recent data might be hidden

## 2020-02-10 NOTE — Plan of Care (Signed)
  Problem: Education: Goal: Knowledge of General Education information will improve Description: Including pain rating scale, medication(s)/side effects and non-pharmacologic comfort measures Outcome: Progressing   Problem: Activity: Goal: Risk for activity intolerance will decrease Outcome: Progressing   Problem: Nutrition: Goal: Adequate nutrition will be maintained Outcome: Progressing   

## 2020-02-10 NOTE — Plan of Care (Signed)
  Problem: Health Behavior/Discharge Planning: Goal: Ability to manage health-related needs will improve Outcome: Progressing   Problem: Pain Managment: Goal: General experience of comfort will improve Outcome: Progressing   Problem: Skin Integrity: Goal: Skin integrity will improve Outcome: Progressing

## 2020-02-10 NOTE — TOC Progression Note (Signed)
Transition of Care [facility]) - Progression Note    Patient Details  Name: [name] [name] MRN: [id number] Date of Birth: 1955/07/13  Transition of Care [facility]) CM/SW [place], [place] Phone Number: 02/10/2020, 4:45 PM  Clinical Narrative:   Peer to Peer was offered to [place]. CSW coordinated.   CSW received call [employer] Medicare Rep. Zenda Alpers, she states the insurance Medicare denied to cover her rehab stay at [facility]. Medical Director reports the patient is at her level of funcitioning-total care. Patient is long term care appropriate.  Insurance offered an appeal to the patient at  [phone]. It can take up to 72 hours for a decision to made.  CSW provided the patient with the information.   Patient disposition plan ongoing.    Expected Discharge Plan: Skilled Nursing Facility Barriers to Discharge: SNF Authorization Denied  Expected Discharge Plan and Services Expected Discharge Plan: [place] In-house Referral: Clinical Social Work Discharge Planning Services: CM Consult Post Acute Care Choice: [place] arrangements for the past 2 months: [place]                                       Social Determinants of Health (SDOH) Interventions    Readmission Risk Interventions Readmission Risk Prevention Plan 02/06/2020 11/08/2019  Transportation Screening Complete Complete  Medication Review Press photographer) - Referral to Pharmacy  PCP or Specialist appointment within 3-5 days of discharge Complete Complete  HRI or [place] - Complete  SW Recovery Care/Counseling Consult Complete Complete  Palliative Care Screening Not Applicable Not Applicable  Skilled Nursing Facility Complete Complete  Some recent data might be hidden

## 2020-02-10 NOTE — Progress Notes (Signed)
[facility]  PROGRESS NOTE  [name] [name] QQP:[id number] DOB: 04/10/1955 DOA: 02/03/2020 PCP: [name], MD   Brief HPI:   64 year old female with past medical history of diabetes mellitus type 2, diabetic neuropathy, hypertension, chronic diastolic CHF, chronic anemia, morbid obesity who was recently mated 2 months ago for hemorrhagic shock, underwent embolization of the artery, source was upper GI bleed.  She has been experiencing increased swelling and redness of right lower extremity for past few days.  She was noted for right leg cellulitis and started on empiric antibiotics.  Bilateral venous duplex was negative for DVT.  General surgery was consulted and recommended ultrasound to rule out abscess.  Ultrasound was negative for abscess.  General surgery has signed off.  Patient awaiting insurance authorization at skilled nursing facility.    Subjective   Patient seen and examined, denies any complaints.   Assessment/Plan:     1. Right lower extremity cellulitis-x-ray did not show osteomyelitis.  Patient started on empiric antibiotics.  Lactic acid 0.9.  Bilateral venous duplex of lower extremities negative for DVT.  Antibiotics were broadened to Ancef.   Right lower extremity ultrasound was negative for abscess.  General surgery recommended to continue antibiotics.  Transition to Bactrim DS on discharge. 2. Diabetes mellitus type 2-blood glucose well controlled, continue sliding scale insulin NovoLog. 3. Acute kidney injury on CKD stage III-Ejiro dehydration, resolved.  Currently creatinine 0.94. 4. Chronic diastolic CHF-currently euvolemic. 5. Hypertension-continue home medications 6. Anemia-recent history of hemorrhagic shock from upper GI bleed requiring embolization and multiple blood transfusion.  Hemoglobin is currently stable at 9.8.     COVID-19 Labs  No results for input(s): DDIMER, FERRITIN, LDH, CRP in the last 72 hours.  Lab Results  Component  Value Date   SARSCOV2NAA NEGATIVE 02/03/2020   [place] NEGATIVE 11/21/2019   [place] NEGATIVE 11/05/2019   [place] NEGATIVE 10/29/2019     Scheduled medications:   . allopurinol  100 mg Oral BID  . atorvastatin  10 mg Oral Daily  . busPIRone  5 mg Oral Q8H  . DULoxetine  30 mg Oral BID  . enoxaparin (LOVENOX) injection  40 mg Subcutaneous Q24H  . famotidine  20 mg Oral Daily  . gabapentin  600 mg Oral BID  . insulin aspart  0-9 Units Subcutaneous TID WC  . melatonin  5 mg Oral QHS  . pantoprazole  40 mg Oral BID  . zolpidem  5 mg Oral QHS         CBG: Recent Labs  Lab 02/09/20 1132 02/09/20 1618 02/09/20 2127 02/10/20 0729 02/10/20 1133  GLUCAP 111* 150* 126* 99 96    SpO2: 97 % O2 Flow Rate (L/min): 2 L/min    CBC: Recent Labs  Lab 02/05/20 0321 02/06/20 0325 02/07/20 0330 02/08/20 0253 02/09/20 0249  WBC 13.6* 9.1 9.8 10.3 10.1  NEUTROABS 9.4* 5.5 6.1 6.7 6.7  HGB 10.6* 9.4* 11.7* 9.6* 9.8*  HCT 34.4* 30.7* 37.0 30.6* 30.4*  MCV 91.7 91.4 89.2 89.2 87.4  PLT 470* 490* 509* 554* 540*    Basic Metabolic Panel: Recent Labs  Lab 02/06/20 0325 02/07/20 0330 02/08/20 0253 02/09/20 0249 02/10/20 0249  NA 139 141 140 142 142  K 3.8 3.6 3.7 3.4* 3.5  CL 105 106 105 105 105  CO2 24 25 25 26 26   GLUCOSE 96 107* 126* 89 89  BUN 48* 34* 21 13 9   CREATININE 1.43* 1.18* 0.98 0.83 0.83  CALCIUM 8.9 9.2 8.9 9.0 9.0  MG 2.0 1.6* 1.8 1.4* 1.3*  PHOS 3.0 2.5 2.4* 2.9 3.4     Liver Function Tests: Recent Labs  Lab 02/05/20 0321 02/06/20 0325 02/07/20 0330 02/08/20 0253 02/09/20 0249  AST 10* 8* 11* 23 21  ALT 9 7 7 6 8   ALKPHOS 96 79 79 75 69  BILITOT 0.5 0.4 0.3 0.4 0.3  PROT 6.9 6.4* 6.7 6.4* 6.4*  ALBUMIN 2.7* 2.6* 2.7* 2.5* 2.6*     Antibiotics: Anti-infectives (From admission, onward)   Start     Dose/Rate Route Frequency Ordered Stop   02/06/20 0600  vancomycin (VANCOREADY) IVPB 1500 mg/300 mL  Status:  Discontinued         1,500 mg 150 mL/hr over 120 Minutes Intravenous Every 48 hours 02/04/20 0906 02/05/20 1410   02/06/20 0600  ceFAZolin (ANCEF) IVPB 2g/100 mL premix        2 g 200 mL/hr over 30 Minutes Intravenous Every 8 hours 02/05/20 1410     02/06/20 0400  vancomycin (VANCOREADY) IVPB 1750 mg/350 mL  Status:  Discontinued        1,750 mg 175 mL/hr over 120 Minutes Intravenous Every 48 hours 02/04/20 0323 02/04/20 0906   02/04/20 1400  cefTRIAXone (ROCEPHIN) 2 g in sodium chloride 0.9 % 100 mL IVPB  Status:  Discontinued        2 g 200 mL/hr over 30 Minutes Intravenous Every 24 hours 02/04/20 0906 02/05/20 1410   02/04/20 1400  metroNIDAZOLE (FLAGYL) tablet 500 mg  Status:  Discontinued        500 mg Oral Every 8 hours 02/04/20 0906 02/05/20 1410   02/04/20 0515  meropenem (MERREM) 1 g in sodium chloride 0.9 % 100 mL IVPB  Status:  Discontinued        1 g 200 mL/hr over 30 Minutes Intravenous Every 12 hours 02/04/20 0429 02/04/20 0906   02/04/20 0415  meropenem (MERREM) 1 g in sodium chloride 0.9 % 100 mL IVPB  Status:  Discontinued        1 g 200 mL/hr over 30 Minutes Intravenous Every 8 hours 02/04/20 0323 02/04/20 0429   02/04/20 0400  vancomycin (VANCOCIN) IVPB 1000 mg/200 mL premix  Status:  Discontinued        1,000 mg 200 mL/hr over 60 Minutes Intravenous  Once 02/04/20 0308 02/04/20 0318   02/04/20 0400  meropenem (MERREM) 1 g in sodium chloride 0.9 % 100 mL IVPB  Status:  Discontinued        1 g 200 mL/hr over 30 Minutes Intravenous  Once 02/04/20 0308 02/04/20 0323   02/04/20 0400  vancomycin (VANCOREADY) IVPB 2000 mg/400 mL        2,000 mg 200 mL/hr over 120 Minutes Intravenous  Once 02/04/20 0318 02/04/20 0521   02/03/20 2300  vancomycin (VANCOREADY) IVPB 2000 mg/400 mL  Status:  Discontinued        2,000 mg 200 mL/hr over 120 Minutes Intravenous  Once 02/03/20 2248 02/04/20 0312       DVT prophylaxis: Lovenox  Code Status: Full code  Family Communication: No family at  bedside   Consultants:  General surgery  Procedures:      Objective   Vitals:   02/09/20 1403 02/09/20 2125 02/10/20 0500 02/10/20 0620  BP: 127/63 (!) 139/57  127/60  Pulse: 96 99  98  Resp: 20 17  17   Temp: 98.1 F (36.7 C) 98.9 F (37.2 C)  98.1 F (36.7 C)  TempSrc: Oral Oral  Oral  SpO2: 95% 98%  97%  Weight:   (!) 142.3 kg   Height:        Intake/Output Summary (Last 24 hours) at 02/10/2020 1146 Last data filed at 02/10/2020 0630 Gross per 24 hour  Intake 611.96 ml  Output 700 ml  Net -88.04 ml    12/18 1901 - 12/20 0700 In: 1051.8 [P.O.:150; I.V.:329.8] Out: 1550 [Urine:1550]  Filed Weights   02/08/20 0500 02/09/20 0404 02/10/20 0500  Weight: (!) 144 kg (!) 143.2 kg (!) 142.3 kg    Physical Examination:   General-appears in no acute distress Heart-S1-S2, regular, no murmur auscultated Lungs-clear to auscultation bilaterally, no wheezing or crackles auscultated Abdomen-soft, nontender, no organomegaly Extremities-no edema in the lower extremities Neuro-alert, oriented x3, no focal deficit noted  Status is: Inpatient  Dispo: The patient is from: Skilled nursing facility              Anticipated d/c is to: Skilled nursing facility              Anticipated d/c date is: 02/11/2020              Patient currently medically stable for discharge  Barrier to discharge-awaiting bed at skilled nursing facility     Data Reviewed:   Recent Results (from the past 240 hour(s))  Blood Culture (routine x 2)     Status: None   Collection Time: 02/03/20 11:51 PM   Specimen: BLOOD  Result Value Ref Range Status   Specimen Description   Final    BLOOD RIGHT ANTECUBITAL Performed at [facility] [address], [place], [place] [address]    Special Requests   Final    BOTTLES DRAWN AEROBIC AND ANAEROBIC Blood Culture results may not be optimal due to an excessive volume of blood received in culture bottles Performed at [facility] [address], [place], [place] [address]    Culture   Final    NO GROWTH 5 DAYS Performed at [facility], [place] [address], [place], [place] [address]    Report Status 02/09/2020 FINAL  Final  Resp Panel by RT-PCR (Flu A&B, Covid) Nasopharyngeal Swab     Status: None   Collection Time: 02/03/20 11:51 PM   Specimen: Nasopharyngeal Swab; Nasopharyngeal(NP) swabs in vial transport medium  Result Value Ref Range Status   SARS Coronavirus 2 by RT PCR NEGATIVE NEGATIVE Final    Comment: (NOTE) SARS-CoV-2 target nucleic acids are NOT DETECTED.  The SARS-CoV-2 RNA is generally detectable in upper respiratory specimens during the acute phase of infection. The lowest concentration of SARS-CoV-2 viral copies this assay can detect is 138 copies/mL. A negative result does not preclude SARS-Cov-2 infection and should not be used as the sole basis for treatment or other patient management decisions. A negative result may occur with  improper specimen collection/handling, submission of specimen other than nasopharyngeal swab, presence of viral mutation(s) within the areas targeted by this assay, and inadequate number of viral copies(<138 copies/mL). A negative result must be combined with clinical observations, patient history, and epidemiological information. The expected result is Negative.  Fact Sheet for Patients:  EntrepreneurPulse.com.au  Fact Sheet for Healthcare Providers:  IncredibleEmployment.be  This test is no t yet approved or cleared by the [place] FDA and  has been authorized for detection and/or diagnosis of SARS-CoV-2 by FDA under an Emergency Use Authorization (EUA). This EUA will remain  in effect (meaning this test can be  used) for the duration of the COVID-19 declaration under Section 564(b)(1) of the Act, 21 U.S.C.section 360bbb-3(b)(1), unless the authorization is terminated  or revoked sooner.        Influenza A by PCR NEGATIVE NEGATIVE Final   Influenza B by PCR NEGATIVE NEGATIVE Final    Comment: (NOTE) The Xpert Xpress SARS-CoV-2/FLU/RSV plus assay is intended as an aid in the diagnosis of influenza from Nasopharyngeal swab specimens and should not be used as a sole basis for treatment. Nasal washings and aspirates are unacceptable for Xpert Xpress SARS-CoV-2/FLU/RSV testing.  Fact Sheet for Patients: EntrepreneurPulse.com.au  Fact Sheet for Healthcare Providers: IncredibleEmployment.be  This test is not yet approved or cleared by the [place] FDA and has been authorized for detection and/or diagnosis of SARS-CoV-2 by FDA under an Emergency Use Authorization (EUA). This EUA will remain in effect (meaning this test can be used) for the duration of the COVID-19 declaration under Section 564(b)(1) of the Act, 21 U.S.C. section 360bbb-3(b)(1), unless the authorization is terminated or revoked.  Performed at [facility], [place] [address], [place], [place] [address]   Urine culture     Status: None   Collection Time: 02/06/20  6:40 AM   Specimen: In/Out Cath Urine  Result Value Ref Range Status   Specimen Description   Final    IN/OUT CATH URINE Performed at [facility] [address], [place], [place] [address]    Special Requests   Final    NONE Performed at [facility], [place] [address] [place], [place] [address]    Culture   Final    NO GROWTH Performed at [facility], [place] [address], [place], [place] [address]    Report Status 02/07/2020 FINAL  Final    No results for input(s): LIPASE, AMYLASE in the last 168 hours. No results for input(s): AMMONIA in the last 168 hours.  Cardiac Enzymes: No results for input(s): CKTOTAL, CKMB, CKMBINDEX, TROPONINI in the last 168 hours. BNP (last 3 results) Recent Labs    08/20/19 1250 11/05/19 2216 11/22/19 1417   BNP 511.0* 608.6* 333.7*         [place]   [facility] If 7PM-7AM, please contact night-coverage at [email or web address], Office  [phone]   02/10/2020, 11:46 AM  LOS: 6 days

## 2020-02-11 DIAGNOSIS — N1832 Chronic kidney disease, stage 3b: Secondary | ICD-10-CM

## 2020-02-11 LAB — GLUCOSE, CAPILLARY
Glucose-Capillary: 88 mg/dL (ref 70–99)
Glucose-Capillary: 103 mg/dL — ABNORMAL HIGH (ref 70–99)
Glucose-Capillary: 136 mg/dL — ABNORMAL HIGH (ref 70–99)

## 2020-02-11 LAB — CREATININE, SERUM
Creatinine, Ser: 0.85 mg/dL (ref 0.44–1.00)
GFR, Estimated: >60 mL/min (ref >60)

## 2020-02-11 LAB — PHOSPHORUS: Phosphorus: 3.4 mg/dL (ref 2.5–4.6)

## 2020-02-11 LAB — MAGNESIUM
Magnesium: 1.3 mg/dL — ABNORMAL LOW (ref 1.7–2.4)
Magnesium: 2.4 mg/dL (ref 1.7–2.4)

## 2020-02-11 MED ORDER — MAGNESIUM SULFATE 4 GM/100ML IV SOLN
4.0000 g | Freq: Once | INTRAVENOUS | Status: AC
  Administered 2020-02-11: 15:00:00 4 g via INTRAVENOUS
  Filled 2020-02-11: qty 100

## 2020-02-11 MED ORDER — HYDROCODONE-ACETAMINOPHEN 5-325 MG PO TABS: 1.0000 | ORAL_TABLET | Freq: Four times a day (QID) | ORAL | 0 refills | Status: DC | PRN

## 2020-02-11 MED ORDER — ALPRAZOLAM 0.5 MG PO TABS: 0.5000 mg | ORAL_TABLET | Freq: Three times a day (TID) | ORAL | 0 refills | Status: DC | PRN

## 2020-02-11 MED ORDER — FUROSEMIDE 20 MG PO TABS: 20.0000 mg | ORAL_TABLET | Freq: Every day | ORAL | 11 refills | Status: DC

## 2020-02-11 NOTE — Discharge Summary (Signed)
Physician Discharge Summary  [name] [name]:[id number] DOB: 19-Nov-1955 DOA: 02/03/2020  PCP: [name], MD  Admit date: 02/03/2020 Discharge date: 02/11/2020  Time spent: 50 * minutes  Recommendations for Outpatient Follow-up:  1. Follow-up PCP in 2 weeks 2. Follow-up wound care clinic on 03/17/2020   Discharge Diagnoses:  Principal Problem:   Lower extremity cellulitis Active Problems:   Type 2 diabetes mellitus with stage 3 chronic kidney disease (HCC)   CKD (chronic kidney disease), stage III (HCC)   Cellulitis   Essential hypertension   Morbid obesity ([place])   Discharge Condition: Stable  Diet recommendation: Heart healthy diet  Filed Weights   02/09/20 0404 02/10/20 0500 02/11/20 0500  Weight: (!) 143.2 kg (!) 142.3 kg (!) 142.5 kg    History of present illness:  64 year old female with past medical history of diabetes mellitus type 2, diabetic neuropathy, hypertension, chronic diastolic CHF, chronic anemia, morbid obesity who was recently mated 2 months ago for hemorrhagic shock, underwent embolization of the artery, source was upper GI bleed.  She has been experiencing increased swelling and redness of right lower extremity for past few days.  She was noted for right leg cellulitis and started on empiric antibiotics.  Bilateral venous duplex was negative for DVT.  General surgery was consulted and recommended ultrasound to rule out abscess.  Ultrasound was negative for abscess.  General surgery has signed off.  Patient awaiting insurance authorization at skilled nursing facility.   Hospital Course:   1. Right lower extremity cellulitis-x-ray did not show osteomyelitis.  Patient started on empiric antibiotics.  Lactic acid 0.9.  Bilateral venous duplex of lower extremities negative for DVT.  Antibiotics were broadened to Ancef.   Right lower extremity ultrasound was negative for abscess.  General surgery recommended to continue antibiotics.    Patient  cellulitis has resolved.  Antibiotics have been discontinued.  Patient received 7 days of antibiotics in the hospital.. 2. Diabetes mellitus type 2-blood glucose well controlled, continue home medications. 3. Acute kidney injury on CKD stage III-Ejiro dehydration, resolved.  Currently creatinine 0.94. 4. Chronic diastolic CHF-currently euvolemic.  Continue Lasix 20 mg p.o. daily. 5. Hypertension-continue home medications 6. Anemia-recent history of hemorrhagic shock from upper GI bleed requiring embolization and multiple blood transfusion.  Hemoglobin is currently stable at 9.8. 7. Hypomagnesemia-magnesium 1.3, given 4 g of IV magnesium sulfate x1.  Repeat magnesium was 2.4.  8. Disposition/social issues-patient did not want to go to nursing home and wants to go home.  Called patient's brother and boyfriend and they both agree that patient can come home.  She has been total assist and will require Hoyer lift.  Hoyer lift and hospital bed has been ordered.  Home health PT, social work, Therapist, sports , home health aide has been ordered.  Patient will be discharged home as per her wishes.   Procedures:    Consultations:  General surgery  Discharge Exam: Vitals:   02/11/20 0533 02/11/20 1343  BP: (!) 139/57 130/71  Pulse: 98 (!) 103  Resp: 16 18  Temp: 98 F (36.7 C) 98.6 F (37 C)  SpO2: 100% 98%    General: Appears in no acute distress Cardiovascular: S1-S2, regular Respiratory: Clear to auscultation bilaterally  Discharge Instructions   Discharge Instructions    Diet - low sodium heart healthy   Complete by: As directed    Discharge wound care:   Complete by: As directed    Clean wounds with NS, pat dry. Apply Aquacel Advantage [name] #  [id number]) to the wound, cover with 4 x 4 and secure with Medipore tape. May place InterDry Ag [name] # [id number]) in the pannus fold to wick moisture.  Measure and cut length of InterDry to fit in skin folds that have skin breakdown  Tuck InterDry fabric  into skin folds in a single layer, allow for 2 inches of overhang from skin edges to allow for wicking to occur May remove to bathe; dry area thoroughly and then tuck into affected areas again  Do not apply any creams or ointments when using InterDry DO NOT THROW AWAY FOR 5 DAYS unless soiled with stool DO NOT [facility] product, this will inactivate the silver in the material  New sheet of Interdry should be applied after 5 days of use if patient continues to have skin breakdown   Increase activity slowly   Complete by: As directed      Allergies as of 02/11/2020      Reactions   Morphine Itching, Rash   Penicillins Itching, Nausea And Vomiting, Rash, Hives   Has patient had a PCN reaction causing immediate rash, facial/tongue/throat swelling, SOB or lightheadedness with hypotension: No Has patient had a PCN reaction causing severe rash involving mucus membranes or skin necrosis: No Has patient had a PCN reaction that required hospitalization: No Has patient had a PCN reaction occurring within the last 10 years: No If all of the above answers are "NO", then may proceed with Cephalosporin use. Has patient had a PCN reaction causing immediate rash, facial/tongue/throat swelling, SOB or lightheadedness with hypotension: No Has patient had a PCN reaction causing severe rash involving mucus membranes or skin necrosis: No Has patient had a PCN reaction that required hospitalization: No Has patient had a PCN reaction occurring within the last 10 years: No If all of the above answers are "NO", then may proceed with Cephalosporin use.      Medication List    STOP taking these medications   atenolol-chlorthalidone 50-25 MG tablet Commonly known as: TENORETIC   hydrOXYzine 50 MG tablet Commonly known as: ATARAX/VISTARIL   nystatin powder Commonly known as: MYCOSTATIN/NYSTOP   omeprazole 20 MG capsule Commonly known as: PRILOSEC   OXYCODONE HCL PO   senna 8.6 MG Tabs tablet Commonly known  as: SENOKOT   sorbitol 70 % Soln   zolpidem 5 MG tablet Commonly known as: AMBIEN     TAKE these medications   allopurinol 100 MG tablet Commonly known as: ZYLOPRIM Take 100 mg by mouth 2 (two) times daily.   ALPRAZolam 0.5 MG tablet Commonly known as: XANAX Take 1 tablet (0.5 mg total) by mouth 3 (three) times daily as needed for anxiety.   atorvastatin 10 MG tablet Commonly known as: LIPITOR Take 10 mg by mouth daily.   busPIRone 5 MG tablet Commonly known as: BUSPAR Take 5 mg by mouth every 8 (eight) hours.   DULoxetine 30 MG capsule Commonly known as: CYMBALTA Take 30 mg by mouth 2 (two) times daily.   famotidine 20 MG tablet Commonly known as: PEPCID Take 20 mg by mouth at bedtime.   furosemide 20 MG tablet Commonly known as: Lasix Take 1 tablet (20 mg total) by mouth daily. What changed: See the new instructions.   gabapentin 600 MG tablet Commonly known as: NEURONTIN Take 1 tablet (600 mg total) by mouth 2 (two) times daily. What changed: when to take this   HYDROcodone-acetaminophen 5-325 MG tablet Commonly known as: NORCO/VICODIN Take 1 tablet by mouth every 6 (six)  hours as needed for severe pain. What changed: when to take this   pantoprazole 40 MG tablet Commonly known as: Protonix Take 1 tablet (40 mg total) by mouth 2 (two) times daily.   pioglitazone 30 MG tablet Commonly known as: ACTOS Take 30 mg by mouth daily.   senna-docusate 8.6-50 MG tablet Commonly known as: Senokot-S Take 1 tablet by mouth daily. Started 11/24/2019   sitaGLIPtin 100 MG tablet Commonly known as: JANUVIA Take 100 mg by mouth daily.   traZODone 50 MG tablet Commonly known as: DESYREL Take 1 tablet by mouth at bedtime.            Durable Medical Equipment  (From admission, onward)         Start     Ordered   02/11/20 0954  For home use only DME Hospital bed  Once       Question Answer Comment  Length of Need Lifetime   The above medical condition  requires: Patient requires the ability to reposition frequently   Bed type Semi-electric      02/11/20 0953   02/11/20 0953  For home use only DME Other see comment  Once       Comments: Harrel Lemon lift  Question:  Length of Need  Answer:  Lifetime   02/11/20 0953           Discharge Care Instructions  (From admission, onward)         Start     Ordered   02/11/20 0000  Discharge wound care:       Comments: Clean wounds with NS, pat dry. Apply Aquacel Advantage [name] # [phone]) to the wound, cover with 4 x 4 and secure with Medipore tape. May place InterDry Ag [name] # [phone]) in the pannus fold to wick moisture.  Measure and cut length of InterDry to fit in skin folds that have skin breakdown  Tuck InterDry fabric into skin folds in a single layer, allow for 2 inches of overhang from skin edges to allow for wicking to occur May remove to bathe; dry area thoroughly and then tuck into affected areas again  Do not apply any creams or ointments when using InterDry DO NOT THROW AWAY FOR 5 DAYS unless soiled with stool DO NOT [facility] product, this will inactivate the silver in the material  New sheet of Interdry should be applied after 5 days of use if patient continues to have skin breakdown   02/11/20 1820         Allergies  Allergen Reactions  . Morphine Itching and Rash  . Penicillins Itching, Nausea And Vomiting, Rash and Hives    Has patient had a PCN reaction causing immediate rash, facial/tongue/throat swelling, SOB or lightheadedness with hypotension: No Has patient had a PCN reaction causing severe rash involving mucus membranes or skin necrosis: No Has patient had a PCN reaction that required hospitalization: No Has patient had a PCN reaction occurring within the last 10 years: No If all of the above answers are "NO", then may proceed with Cephalosporin use.  Has patient had a PCN reaction causing immediate rash, facial/tongue/throat swelling, SOB or lightheadedness with  hypotension: No Has patient had a PCN reaction causing severe rash involving mucus membranes or skin necrosis: No Has patient had a PCN reaction that required hospitalization: No Has patient had a PCN reaction occurring within the last 10 years: No If all of the above answers are "NO", then may proceed with Cephalosporin use.  Contact information for follow-up providers    [place] Follow up.   Why: This agency will provide nurse, physical therapy, nurse aide and social work services. The agency will call you prior to the first visit.  Contact information: [place] [address] [place]             . Go to.   Why: Appointment on 03/17/2020 at 1:15pm. Please arrive 15 minutes early to complete consent forms.  Contact information: [address] [place] [phone]           Contact information for after-discharge care    Destination    [facility] .   Service: Skilled Nursing Contact information: [address] [place] [phone]                   The results of significant diagnostics from this hospitalization (including imaging, microbiology, ancillary and laboratory) are listed below for reference.    Significant Diagnostic Studies: DG Tibia/Fibula Right  Result Date: 02/03/2020 CLINICAL DATA:  Cellulitis. Right leg swelling and redness. Pain EXAM: RIGHT TIBIA AND FIBULA - 2 VIEW COMPARISON:  None. FINDINGS: The cortical margins of the tibia and fibula are intact. No bony destruction or periosteal reaction. No fracture. Moderate osteoarthritis of the knee, with fragmented and elongated quadriceps tendon enthesophyte. Ankle alignment is maintained. Soft tissue edema is noted most prominent laterally. No soft tissue air or radiopaque foreign body. Occasional  soft tissue phleboliths. IMPRESSION: 1. Soft tissue edema most prominent laterally. No soft tissue air or radiopaque foreign body. 2. No evidence of osteomyelitis or acute osseous abnormality. Moderate osteoarthritis of the knee. Electronically Signed   By: Keith Rake M.D.   On: 02/03/2020 21:28   [place] RT LOWER EXTREM LTD SOFT TISSUE NON VASCULAR  Result Date: 02/07/2020 CLINICAL DATA:  Cellulitis and abscess of right lower extremity. Per ultrasound tech note: Relevant history includes physician's note asking to check for abscess in the right medial thigh. EXAM: ULTRASOUND right LOWER EXTREMITY LIMITED TECHNIQUE: Ultrasound examination of the lower extremity soft tissues was performed in the area of clinical concern. COMPARISON:  X-ray right femur 02/03/2020 FINDINGS: No soft tissue mass or cyst identified within the right medial thigh and posterior right thigh. No soft tissue mass or cyst identified within the posterior right knee. Pain due to pressure noted along the posterior right thigh and posterior right knee. Subcutaneus soft tissue edema of the posterior right knee. IMPRESSION: 1. Subcutaneus soft tissue edema of the posterior right knee. 2. No definite abscess identified along the right medial thigh, posterior right thigh, and posterior right knee. Electronically Signed   By: Iven Finn M.D.   On: 02/07/2020 19:42   DG Femur Min 2 Views Right  Result Date: 02/03/2020 CLINICAL DATA:  Cellulitis.  Right leg swelling and redness. Pain. EXAM: RIGHT FEMUR 2 VIEWS COMPARISON:  None. FINDINGS: Cortical margins of the femur are intact. No bony destruction or fracture. There is no evidence of focal bone lesion. Hip joint space is grossly maintained, detailed assessment limited by soft tissue attenuation from habitus. Elongated and fragmented quadriceps tendon enthesophyte. There are vascular calcifications. No soft tissue air. Portions of the soft tissues are excluded from the field of view due to  habitus. IMPRESSION: 1. No evidence of osteomyelitis. 2. Elongated and  fragmented quadriceps tendon enthesophyte. 3. Vascular calcifications. No air in the included soft tissues, which are only partially included due to habitus. Electronically Signed   By: Keith Rake M.D.   On: 02/03/2020 21:26   VAS [place] LOWER EXTREMITY VENOUS (DVT)  Result Date: 02/04/2020  Lower Venous DVT Study Indications: Edema.  Risk Factors: None identified. Limitations: Body habitus, poor ultrasound/tissue interface and patient positioning, patient somnolence. Comparison Study: No prior studies. Performing Technologist: Oliver Hum RVT  Examination Guidelines: A complete evaluation includes B-mode imaging, spectral Doppler, color Doppler, and power Doppler as needed of all accessible portions of each vessel. Bilateral testing is considered an integral part of a complete examination. Limited examinations for reoccurring indications may be performed as noted. The reflux portion of the exam is performed with the patient in reverse Trendelenburg.  +---------+---------------+---------+-----------+----------+-------------------+ RIGHT    CompressibilityPhasicitySpontaneityPropertiesThrombus Aging      +---------+---------------+---------+-----------+----------+-------------------+ CFV      Full           Yes      Yes                                      +---------+---------------+---------+-----------+----------+-------------------+ SFJ      Full                                                             +---------+---------------+---------+-----------+----------+-------------------+ FV Prox  Full                                                             +---------+---------------+---------+-----------+----------+-------------------+ FV Mid                  Yes      Yes                                      +---------+---------------+---------+-----------+----------+-------------------+ FV  Distal               Yes      Yes                                      +---------+---------------+---------+-----------+----------+-------------------+ PFV                                                   Not well visualized +---------+---------------+---------+-----------+----------+-------------------+ POP                     Yes      Yes                                      +---------+---------------+---------+-----------+----------+-------------------+ PTV  Full                                                             +---------+---------------+---------+-----------+----------+-------------------+ PERO                                                  Not well visualized +---------+---------------+---------+-----------+----------+-------------------+   +---------+---------------+---------+-----------+----------+-------------------+ LEFT     CompressibilityPhasicitySpontaneityPropertiesThrombus Aging      +---------+---------------+---------+-----------+----------+-------------------+ CFV      Full           Yes      Yes                                      +---------+---------------+---------+-----------+----------+-------------------+ SFJ      Full                                                             +---------+---------------+---------+-----------+----------+-------------------+ FV Prox  Full                                                             +---------+---------------+---------+-----------+----------+-------------------+ FV Mid   Full                                                             +---------+---------------+---------+-----------+----------+-------------------+ FV Distal                                             Not well visualized +---------+---------------+---------+-----------+----------+-------------------+ PFV                                                   Not well visualized  +---------+---------------+---------+-----------+----------+-------------------+ POP      Full           Yes      Yes                                      +---------+---------------+---------+-----------+----------+-------------------+ PTV      Full                                                             +---------+---------------+---------+-----------+----------+-------------------+  PERO     Full                                                             +---------+---------------+---------+-----------+----------+-------------------+     Summary: RIGHT: - There is no evidence of deep vein thrombosis in the lower extremity. However, portions of this examination were limited- see technologist comments above.  - No cystic structure found in the popliteal fossa.  LEFT: - There is no evidence of deep vein thrombosis in the lower extremity. However, portions of this examination were limited- see technologist comments above.  - No cystic structure found in the popliteal fossa.  *See table(s) above for measurements and observations. Electronically signed by Deitra Mayo MD on 02/04/2020 at 1:51:47 PM.    Final     Microbiology: Recent Results (from the past 240 hour(s))  Blood Culture (routine x 2)     Status: None   Collection Time: 02/03/20 11:51 PM   Specimen: BLOOD  Result Value Ref Range Status   Specimen Description   Final    BLOOD RIGHT ANTECUBITAL Performed at [facility], [place] [address] [place], [place] [address]    Special Requests   Final    BOTTLES DRAWN AEROBIC AND ANAEROBIC Blood Culture results may not be optimal due to an excessive volume of blood received in culture bottles Performed at [employer] [address], [place], [place] [address]    Culture   Final    NO GROWTH 5 DAYS Performed at [facility], [place] [address] [place], [place] [address]    Report Status 02/09/2020 FINAL  Final  Resp Panel by  RT-PCR (Flu A&B, Covid) Nasopharyngeal Swab     Status: None   Collection Time: 02/03/20 11:51 PM   Specimen: Nasopharyngeal Swab; Nasopharyngeal(NP) swabs in vial transport medium  Result Value Ref Range Status   SARS Coronavirus 2 by RT PCR NEGATIVE NEGATIVE Final    Comment: (NOTE) SARS-CoV-2 target nucleic acids are NOT DETECTED.  The SARS-CoV-2 RNA is generally detectable in upper respiratory specimens during the acute phase of infection. The lowest concentration of SARS-CoV-2 viral copies this assay can detect is 138 copies/mL. A negative result does not preclude SARS-Cov-2 infection and should not be used as the sole basis for treatment or other patient management decisions. A negative result may occur with  improper specimen collection/handling, submission of specimen other than nasopharyngeal swab, presence of viral mutation(s) within the areas targeted by this assay, and inadequate number of viral copies(<138 copies/mL). A negative result must be combined with clinical observations, patient history, and epidemiological information. The expected result is Negative.  Fact Sheet for Patients:  EntrepreneurPulse.com.au  Fact Sheet for Healthcare Providers:  IncredibleEmployment.be  This test is no t yet approved or cleared by the [place] FDA and  has been authorized for detection and/or diagnosis of SARS-CoV-2 by FDA under an Emergency Use Authorization (EUA). This EUA will remain  in effect (meaning this test can be used) for the duration of the COVID-19 declaration under Section 564(b)(1) of the Act, 21 U.S.C.section 360bbb-3(b)(1), unless the authorization is terminated  or revoked sooner.       Influenza A by PCR NEGATIVE NEGATIVE Final   Influenza B by PCR NEGATIVE NEGATIVE Final  Comment: (NOTE) The Xpert Xpress SARS-CoV-2/FLU/RSV plus assay is intended as an aid in the diagnosis of influenza from Nasopharyngeal swab  specimens and should not be used as a sole basis for treatment. Nasal washings and aspirates are unacceptable for Xpert Xpress SARS-CoV-2/FLU/RSV testing.  Fact Sheet for Patients: EntrepreneurPulse.com.au  Fact Sheet for Healthcare Providers: IncredibleEmployment.be  This test is not yet approved or cleared by the [place] FDA and has been authorized for detection and/or diagnosis of SARS-CoV-2 by FDA under an Emergency Use Authorization (EUA). This EUA will remain in effect (meaning this test can be used) for the duration of the COVID-19 declaration under Section 564(b)(1) of the Act, 21 U.S.C. section 360bbb-3(b)(1), unless the authorization is terminated or revoked.  Performed at [facility], [place] [address] [place], [place] [address]   Urine culture     Status: None   Collection Time: 02/06/20  6:40 AM   Specimen: In/Out Cath Urine  Result Value Ref Range Status   Specimen Description   Final    IN/OUT CATH URINE Performed at [place] [address] [place], [place] [address]    Special Requests   Final    NONE Performed at [facility], [place] [address], [place], [place] [address]    Culture   Final    NO GROWTH Performed at [facility], [place] [address], [place], [place] [address]    Report Status 02/07/2020 FINAL  Final     Labs: Basic Metabolic Panel: Recent Labs  Lab 02/06/20 0325 02/07/20 0330 02/08/20 0253 02/09/20 0249 02/10/20 0249 02/11/20 0818 02/11/20 1708  NA 139 141 140 142 142  --   --   K 3.8 3.6 3.7 3.4* 3.5  --   --   CL 105 106 105 105 105  --   --   CO2 24 25 25 26 26   --   --   GLUCOSE 96 107* 126* 89 89  --   --   BUN 48* 34* 21 13 9   --   --   CREATININE 1.43* 1.18* 0.98 0.83 0.83 0.85  --   CALCIUM 8.9 9.2 8.9 9.0 9.0  --   --   MG 2.0 1.6* 1.8 1.4* 1.3* 1.3* 2.4  PHOS 3.0 2.5 2.4* 2.9 3.4 3.4  --    Liver Function  Tests: Recent Labs  Lab 02/05/20 0321 02/06/20 0325 02/07/20 0330 02/08/20 0253 02/09/20 0249  AST 10* 8* 11* 23 21  ALT 9 7 7 6 8   ALKPHOS 96 79 79 75 69  BILITOT 0.5 0.4 0.3 0.4 0.3  PROT 6.9 6.4* 6.7 6.4* 6.4*  ALBUMIN 2.7* 2.6* 2.7* 2.5* 2.6*   No results for input(s): LIPASE, AMYLASE in the last 168 hours. No results for input(s): AMMONIA in the last 168 hours. CBC: Recent Labs  Lab 02/05/20 0321 02/06/20 0325 02/07/20 0330 02/08/20 0253 02/09/20 0249  WBC 13.6* 9.1 9.8 10.3 10.1  NEUTROABS 9.4* 5.5 6.1 6.7 6.7  HGB 10.6* 9.4* 11.7* 9.6* 9.8*  HCT 34.4* 30.7* 37.0 30.6* 30.4*  MCV 91.7 91.4 89.2 89.2 87.4  PLT 470* 490* 509* 554* 540*   Cardiac Enzymes: No results for input(s): CKTOTAL, CKMB, CKMBINDEX, TROPONINI in the last 168 hours. BNP: BNP (last 3 results) Recent Labs    08/20/19 1250 11/05/19 2216 11/22/19 1417  BNP 511.0* 608.6* 333.7*    ProBNP (last 3 results) No results for input(s): PROBNP in the last 8760 hours.  CBG: Recent Labs  Lab 02/10/20 1701 02/10/20 2203 02/11/20 0744 02/11/20 1134 02/11/20 1650  GLUCAP 146* 209* 88 103* 136*       Signed:  Oswald Hillock MD.  [facility] 02/11/2020, 6:21 PM
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
## 2020-02-11 NOTE — Plan of Care (Signed)
  Problem: Health Behavior/Discharge Planning: Goal: Ability to manage health-related needs will improve Outcome: Progressing   Problem: Pain Managment: Goal: General experience of comfort will improve Outcome: Progressing   

## 2020-02-11 NOTE — TOC Transition Note (Addendum)
Transition of Care [facility]) - CM/SW Discharge Note   Patient Details  Name: [name] [name] MRN: [id number] Date of Birth: 12/22/1955  [facility]) CM/SW Contact:  Lia Hopping, [place] Phone Number: 02/11/2020, 3:46 PM   Clinical Narrative:    [name] will transport the patient home.   CSW returned call to [place], Left a voicemail.   Final next level of care: [place] Barriers to Discharge: Barriers Resolved   Patient Goals and CMS Choice Patient states their goals for this hospitalization and ongoing recovery are:: return home      Discharge Placement                       Discharge Plan and Services In-house Referral: Clinical Social Work Discharge Planning Services: CM Consult Post Acute Care Choice: [place]          DME Arranged: [place],Hospital bed DME Agency: [employer] Date DME Agency Contacted: 02/11/20 Time DME Agency Contacted: 1000 Representative spoke with at DME Agency: Freda Munro [facility] Arranged: [place] Work [employer] Agency: [place] ([place]) Date [place]: 02/11/20 Time [place]: 1207 Representative spoke with at [place]: [place] Determinants of Health ([place]) Interventions     Readmission Risk Interventions Readmission Risk Prevention Plan 02/06/2020 11/08/2019  Transportation Screening Complete Complete  Medication Review Press photographer) - Referral to Pharmacy  PCP or Specialist appointment within 3-5 days of discharge Complete Complete  HRI or [place] - Complete  SW Recovery Care/Counseling Consult Complete Complete  Palliative Care Screening Not Applicable Not Applicable  Skilled Nursing Facility Complete Complete  Some recent data might be hidden

## 2020-02-11 NOTE — TOC Progression Note (Addendum)
Transition of Care [place]) - Progression Note    Patient Details  Name: [name] [name] MRN: [id number] Date of Birth: [birth year]  Transition of Care [facility]) CM/SW [place], [place] Phone Number: 02/11/2020, 12:08 PM  Clinical Narrative:    CSW offered appeal process to the patient. Patient declined to appeal. Patient does not want to use her VA benefits or use her SSI check to pay for Long term care. The patient wants to go home. CSW and Physician met with the patient at bedside to discuss the importance of having a safe discharge plan. Patient reached out to her brother Lanny Hurst [phone]  and boyfriend and allowed CSW to talk with them. CSW emphasize patient current need for total care. Patient brother and boyfriend Chrissie Noa will both be available to provide 24/7 care along with additional member of their family. Patient boyfriend express concerns about lifting the patient and has asked for a hoyer lift to assist at home. Patient also requests a hospital bed. CSW ordered through [place].  Patient reports she was active with  Wound care center prior to going to SNF. Patient agreeable to continue services. CSW reached out to [place] center, earliest appointment is March 17, 2020 @ 1:15PM.   CSW offered home health choice. [facility] to provide, RN, nurse aide, social work PT services. Patient notified there will be a delay in [place] start of care pending [employer] authorization approval for [employer]. Physician aware.   Per SNF admission Coordinator Loma Boston , the facility will accept the patient as a  LTC resident pending a medicaid application. Patient reports, " they can't be trusted, I am not going, I am going home today."   Per [place]. Freda Munro  the patient hospital bed and hoyer lift will be delivered in the morning due to high demand of DME. CSW notified the patient. Patient does not want to stay and wait for  the DME to be delivered. She reports she has a bed and her boyfriend will be at home to accept her.   [facility] arranged to transport the patient home at 5:00pm.    Expected Discharge Plan: [place] Barriers to Discharge: SNF Authorization Denied  Expected Discharge Plan and Services Expected Discharge Plan: [place] In-house Referral: Clinical Social Work Discharge Planning Services: CM Consult Post Acute Care Choice: [place] arrangements for the past 2 months: [place]                 DME Arranged: [place] bed DME Agency: [employer] Date DME Agency Contacted: 02/11/20 Time DME Agency Contacted: 1000 Representative spoke with at DME Agency: Freda Munro [facility] Arranged: PT,Nurse's [place] Work [employer] Agency: [place] ([place]) Date [place]: 02/11/20 Time [place]: 1207 Representative spoke with at [place]: [place] Determinants of Health ([place]) Interventions    Readmission Risk Interventions Readmission Risk Prevention Plan 02/06/2020 11/08/2019  Transportation Screening Complete Complete  Medication Review Press photographer) - Referral to Pharmacy  PCP or Specialist appointment within 3-5 days of discharge Complete Complete  HRI or [place] - Complete  SW Recovery Care/Counseling Consult Complete Complete  Palliative Care Screening Not Applicable Not Applicable  Skilled Nursing Facility Complete Complete  Some recent data might be hidden

## 2020-02-11 NOTE — Plan of Care (Signed)
?  Problem: Education: ?Goal: Knowledge of General Education information will improve ?Description: Including pain rating scale, medication(s)/side effects and non-pharmacologic comfort measures ?Outcome: Progressing ?  ?Problem: Health Behavior/Discharge Planning: ?Goal: Ability to manage health-related needs will improve ?Outcome: Progressing ?  ?Problem: Clinical Measurements: ?Goal: Ability to maintain clinical measurements within normal limits will improve ?Outcome: Progressing ?Goal: Will remain free from infection ?Outcome: Progressing ?Goal: Diagnostic test results will improve ?Outcome: Progressing ?Goal: Cardiovascular complication will be avoided ?Outcome: Progressing ?  ?Problem: Activity: ?Goal: Risk for activity intolerance will decrease ?Outcome: Progressing ?  ?Problem: Nutrition: ?Goal: Adequate nutrition will be maintained ?Outcome: Progressing ?  ?Problem: Elimination: ?Goal: Will not experience complications related to bowel motility ?Outcome: Progressing ?Goal: Will not experience complications related to urinary retention ?Outcome: Progressing ?  ?Problem: Pain Managment: ?Goal: General experience of comfort will improve ?Outcome: Progressing ?  ?Problem: Safety: ?Goal: Ability to remain free from injury will improve ?Outcome: Progressing ?  ?Problem: Skin Integrity: ?Goal: Risk for impaired skin integrity will decrease ?Outcome: Progressing ?  ?Problem: Clinical Measurements: ?Goal: Ability to avoid or minimize complications of infection will improve ?Outcome: Progressing ?  ?Problem: Skin Integrity: ?Goal: Skin integrity will improve ?Outcome: Progressing ?  ?

## 2020-03-17 ENCOUNTER — Encounter (HOSPITAL_BASED_OUTPATIENT_CLINIC_OR_DEPARTMENT_OTHER): Payer: No Typology Code available for payment source | Admitting: Internal Medicine

## 2020-03-31 DIAGNOSIS — R279 Unspecified lack of coordination: Secondary | ICD-10-CM | POA: Diagnosis not present

## 2020-03-31 DIAGNOSIS — L03116 Cellulitis of left lower limb: Secondary | ICD-10-CM | POA: Diagnosis not present

## 2020-03-31 DIAGNOSIS — R5381 Other malaise: Secondary | ICD-10-CM | POA: Diagnosis not present

## 2020-03-31 DIAGNOSIS — Z743 Need for continuous supervision: Secondary | ICD-10-CM | POA: Diagnosis not present

## 2020-07-16 ENCOUNTER — Other Ambulatory Visit: Payer: Self-pay

## 2020-07-16 ENCOUNTER — Emergency Department (HOSPITAL_COMMUNITY)
Admission: EM | Admit: 2020-07-16 | Discharge: 2020-07-16 | Disposition: A | Discharge status: Home / Self Care | Payer: No Typology Code available for payment source | Attending: Emergency Medicine | Admitting: Emergency Medicine

## 2020-07-16 ENCOUNTER — Encounter (HOSPITAL_COMMUNITY): Payer: Self-pay

## 2020-07-16 DIAGNOSIS — E1122 Type 2 diabetes mellitus with diabetic chronic kidney disease: Secondary | ICD-10-CM | POA: Diagnosis not present

## 2020-07-16 DIAGNOSIS — I959 Hypotension, unspecified: Secondary | ICD-10-CM | POA: Diagnosis not present

## 2020-07-16 DIAGNOSIS — Z7984 Long term (current) use of oral hypoglycemic drugs: Secondary | ICD-10-CM | POA: Diagnosis not present

## 2020-07-16 DIAGNOSIS — Z5189 Encounter for other specified aftercare: Secondary | ICD-10-CM

## 2020-07-16 DIAGNOSIS — R531 Weakness: Secondary | ICD-10-CM | POA: Diagnosis not present

## 2020-07-16 DIAGNOSIS — Z4800 Encounter for change or removal of nonsurgical wound dressing: Secondary | ICD-10-CM | POA: Insufficient documentation

## 2020-07-16 DIAGNOSIS — Z79899 Other long term (current) drug therapy: Secondary | ICD-10-CM | POA: Insufficient documentation

## 2020-07-16 DIAGNOSIS — N183 Chronic kidney disease, stage 3 unspecified: Secondary | ICD-10-CM | POA: Insufficient documentation

## 2020-07-16 DIAGNOSIS — Z87891 Personal history of nicotine dependence: Secondary | ICD-10-CM | POA: Diagnosis not present

## 2020-07-16 DIAGNOSIS — I129 Hypertensive chronic kidney disease with stage 1 through stage 4 chronic kidney disease, or unspecified chronic kidney disease: Secondary | ICD-10-CM | POA: Diagnosis not present

## 2020-07-16 DIAGNOSIS — E1140 Type 2 diabetes mellitus with diabetic neuropathy, unspecified: Secondary | ICD-10-CM | POA: Insufficient documentation

## 2020-07-16 DIAGNOSIS — M25552 Pain in left hip: Secondary | ICD-10-CM | POA: Diagnosis not present

## 2020-07-16 MED ORDER — ALPRAZOLAM 0.5 MG PO TABS: 0.5000 mg | ORAL_TABLET | Freq: Three times a day (TID) | ORAL | 0 refills | Status: DC | PRN

## 2020-07-16 MED ORDER — HYDROCODONE-ACETAMINOPHEN 5-325 MG PO TABS: 1.0000 | ORAL_TABLET | Freq: Four times a day (QID) | ORAL | 0 refills | Status: AC | PRN

## 2020-07-16 MED ORDER — DOXYCYCLINE HYCLATE 100 MG PO CAPS: 100.0000 mg | ORAL_CAPSULE | Freq: Two times a day (BID) | ORAL | 0 refills | Status: AC

## 2020-07-16 MED ORDER — ALPRAZOLAM 0.5 MG PO TABS
0.5000 mg | ORAL_TABLET | Freq: Three times a day (TID) | ORAL | 0 refills | Status: DC | PRN
Start: 2020-07-16 — End: 2020-08-06

## 2020-07-16 MED ORDER — HYDROCODONE-ACETAMINOPHEN 5-325 MG PO TABS: 1.0000 | ORAL_TABLET | Freq: Four times a day (QID) | ORAL | 0 refills | Status: DC | PRN

## 2020-07-16 MED ORDER — DOXYCYCLINE HYCLATE 100 MG PO TABS
100.0000 mg | ORAL_TABLET | Freq: Once | ORAL | Status: AC
  Administered 2020-07-16: 100 mg via ORAL
  Filled 2020-07-16: qty 1

## 2020-07-16 NOTE — ED Notes (Signed)
Pt turned and positioned, new, clean dressings applied to L hip and abdominal wound.

## 2020-07-16 NOTE — ED Notes (Signed)
Called ptar to check on transport status, as per ptar pt is 3rd on list. Updated patient. Helped pt onto bed pain.

## 2020-07-16 NOTE — ED Notes (Signed)
Call placed to [facility] for transport of pt home.

## 2020-07-16 NOTE — ED Notes (Signed)
As per charge rn request pt moved to hallway. Pt upset. Educated that we are waiting for [facility] at this time. Food and drink provided.

## 2020-07-16 NOTE — ED Provider Notes (Signed)
I provided a substantive portion of the care of this patient.  I personally performed the entirety of the history, exam and medical decision making for this encounter.    Patient seen by me along with physician assistant.  Patient sent in by home health nurse for wound evaluation.  Apparently they discussed this with primary care provider who wanted cultures consideration for antibiotics.  Patient's had chronic wounds to her left hip and her right abdomen area.  No evidence of any purulent discharge.  Does have some area of induration just along the margin.  No fluctuance.  Wounds were cultured.  Patient was started on doxycycline.  Patient also requesting refill of her Xanax and her pain medication.  Database reviewed does show that she is due for refill of her pain medication.  And also has not had any Xanax refilled recently.  Patient should be stable for discharge home follow-up home nurse and primary care doctor.       Fredia Sorrow, MD 07/16/20 1330

## 2020-07-16 NOTE — ED Notes (Signed)
Pt had full linen change, placed on purewick. Maximum assist with 2-3 staff members.

## 2020-07-16 NOTE — Discharge Instructions (Addendum)
You came to the emergency department today to be evaluated for your wounds.  We have taken cultures of these wounds however these cultures are pending at this time.  I have started you on the antibiotic doxycycline.  Please take 1 pill twice a day for the next 7 days.  Please follow-up with your primary care provider  You may have diarrhea from the antibiotics.  It is very important that you continue to take the antibiotics even if you get diarrhea unless a medical professional tells you that you may stop taking them.  If you stop too early the bacteria you are being treated for will become stronger and you may need different, more powerful antibiotics that have more side effects and worsening diarrhea.  Please stay well hydrated and consider probiotics as they may decrease the severity of your diarrhea.   Get help right away if: You have a red streak of skin near the area around your wound. Your wound has been closed with staples, sutures, skin glue, or adhesive strips and it begins to open up and separate. Your wound is bleeding, and the bleeding does not stop with gentle pressure. You have a rash. You faint. You have trouble breathing.
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
## 2020-07-16 NOTE — ED Triage Notes (Signed)
Pt has chronic wounds to L hip and stomach. Pts home health aid noticed the hip wound was bleeding today. Pt states her doctor wanted to start oral antibiotics but they could not start it until the wounds were cultured.

## 2020-07-18 LAB — AEROBIC CULTURE W GRAM STAIN (SUPERFICIAL SPECIMEN)

## 2020-07-19 ENCOUNTER — Telehealth: Payer: Self-pay | Admitting: Emergency Medicine

## 2020-07-19 LAB — AEROBIC CULTURE W GRAM STAIN (SUPERFICIAL SPECIMEN)

## 2020-07-19 NOTE — Telephone Encounter (Signed)
Post ED Visit - Positive Culture Follow-up: Unsuccessful Patient Follow-up  Culture assessed and recommendations reviewed by:  []  Elenor Quinones, Pharm.D. []  Heide Guile, Pharm.D., BCPS AQ-ID []  Parks Neptune, Pharm.D., BCPS []  Alycia Rossetti, Pharm.D., BCPS []  [address] [address], Pharm.D., BCPS, AAHIVP []  Legrand Como, Pharm.D., BCPS, AAHIVP [x]  Reuel Boom, PharmD []  Vincenza Hews, PharmD, BCPS  Positive aerobic culture  []  Patient discharged without antimicrobial prescription and treatment is now indicated []  Organism is resistant to prescribed ED discharge antimicrobial []  Patient with positive blood cultures   Unable to contact patient at home number and son's phone number on file, letter will be sent to address on file  [name] [name] 07/19/2020, 5:46 PM

## 2020-07-19 NOTE — Progress Notes (Signed)
ED Antimicrobial Stewardship Positive Culture Follow Up   [name] [name] is an 65 y.o. female who presented to  [facility] on 07/16/2020 with a chief complaint of  Chief Complaint  Patient presents with  . Wound Check    Recent Results (from the past 720 hour(s))  Aerobic Culture w Gram Stain (superficial specimen)     Status: None   Collection Time: 07/16/20  1:26 PM   Specimen: Abdomen; Wound  Result Value Ref Range Status   Specimen Description   Final    ABDOMEN Performed at [facility] [address], [place], [place] [address]    Special Requests   Final    NONE Performed at [facility], [place] [address], [place], [place] [address]    Gram Stain   Final    RARE WBC PRESENT, PREDOMINANTLY PMN FEW GRAM VARIABLE ROD Performed at [facility], [place] [address] [place], [place] [address]    Culture ABUNDANT PSEUDOMONAS AERUGINOSA  Final   Report Status 07/18/2020 FINAL  Final   Organism ID, Bacteria PSEUDOMONAS AERUGINOSA  Final      Susceptibility   Pseudomonas aeruginosa - MIC*    CEFTAZIDIME 2 SENSITIVE Sensitive     CIPROFLOXACIN >=4 RESISTANT Resistant     GENTAMICIN 8 INTERMEDIATE Intermediate     IMIPENEM >=16 RESISTANT Resistant     PIP/TAZO <=4 SENSITIVE Sensitive     CEFEPIME 2 SENSITIVE Sensitive     * ABUNDANT PSEUDOMONAS AERUGINOSA  Aerobic Culture w Gram Stain (superficial specimen)     Status: None (Preliminary result)   Collection Time: 07/16/20  1:27 PM   Specimen: Joint, Hip; Wound  Result Value Ref Range Status   Specimen Description   Final    HIP Performed at [facility], [place] [address], [place], [place] [address]    Special Requests   Final    NONE Performed at [facility], [place] [address], [place], [place] [address]    Gram Stain   Final    RARE WBC PRESENT,BOTH PMN AND MONONUCLEAR RARE GRAM POSITIVE COCCI RARE GRAM POSITIVE RODS    Culture   Final     MODERATE PSEUDOMONAS AERUGINOSA SUSCEPTIBILITIES TO FOLLOW CULTURE REINCUBATED FOR BETTER GROWTH Performed at [facility], [place] [address] [place], [place] [address]    Report Status PENDING  Incomplete    [x]  Treated with doxycycline, organism resistant to prescribed antimicrobial. Unfortunately pseudomonas is resistant to PO abx.  Plan:  Stop doxycycline  Contact patient for symptom check  If improved, f/u with PCP  If worse, return to ED for wound recheck/abx  ED Provider: Franchot Heidelberg, PA-C   Herminio Kniskern, Dowell A 07/19/2020, 11:56 AM Clinical Pharmacist [phone]

## 2020-07-20 ENCOUNTER — Telehealth: Payer: Self-pay | Admitting: Emergency Medicine

## 2020-07-20 NOTE — Progress Notes (Signed)
ED Antimicrobial Stewardship Positive Culture Follow Up   [name] [name] is an 65 y.o. female who presented to [facility] on 07/16/2020 with a chief complaint of  Chief Complaint  Patient presents with  . Wound Check    Recent Results (from the past 720 hour(s))  Aerobic Culture w Gram Stain (superficial specimen)     Status: None   Collection Time: 07/16/20  1:26 PM   Specimen: Abdomen; Wound  Result Value Ref Range Status   Specimen Description   Final    ABDOMEN Performed at [place] [address], [place], [place] [address]    Special Requests   Final    NONE Performed at [facility], [place] [address], [place], [place] [address]    Gram Stain   Final    RARE WBC PRESENT, PREDOMINANTLY PMN FEW GRAM VARIABLE ROD Performed at [facility], [place] [address], [place], [place] [address]    Culture ABUNDANT PSEUDOMONAS AERUGINOSA  Final   Report Status 07/18/2020 FINAL  Final   Organism ID, Bacteria PSEUDOMONAS AERUGINOSA  Final      Susceptibility   Pseudomonas aeruginosa - MIC*    CEFTAZIDIME 2 SENSITIVE Sensitive     CIPROFLOXACIN >=4 RESISTANT Resistant     GENTAMICIN 8 INTERMEDIATE Intermediate     IMIPENEM >=16 RESISTANT Resistant     PIP/TAZO <=4 SENSITIVE Sensitive     CEFEPIME 2 SENSITIVE Sensitive     * ABUNDANT PSEUDOMONAS AERUGINOSA  Aerobic Culture w Gram Stain (superficial specimen)     Status: None   Collection Time: 07/16/20  1:27 PM   Specimen: Joint, Hip; Wound  Result Value Ref Range Status   Specimen Description   Final    HIP Performed at [facility], [place] [address], [place], [place] [address]    Special Requests   Final    NONE Performed at [facility], [place] [address] [place], [place] [address]    Gram Stain   Final    RARE WBC PRESENT,BOTH PMN AND MONONUCLEAR RARE GRAM POSITIVE COCCI RARE GRAM POSITIVE RODS    Culture   Final    MODERATE PSEUDOMONAS  AERUGINOSA WITHIN MIXED ORGANISMS Performed at [facility], [place] [address], [place], [place] [address]    Report Status 07/19/2020 FINAL  Final   Organism ID, Bacteria PSEUDOMONAS AERUGINOSA  Final      Susceptibility   Pseudomonas aeruginosa - MIC*    CEFTAZIDIME <=1 SENSITIVE Sensitive     CIPROFLOXACIN <=0.25 SENSITIVE Sensitive     GENTAMICIN <=1 SENSITIVE Sensitive     IMIPENEM >=16 RESISTANT Resistant     PIP/TAZO <=4 SENSITIVE Sensitive     CEFEPIME 2 SENSITIVE Sensitive     * MODERATE PSEUDOMONAS AERUGINOSA    [x]  Treated with doxycycline, organism resistant to prescribed antimicrobial []  Patient discharged originally without antimicrobial agent and treatment is now indicated  New antibiotic prescription: Levofloxacin 500 mg daily X 7 days. Stop doxycycline.   ED Provider: Carlisle Cater, PA-C   Jimmy Footman, PharmD, BCPS, BCIDP Infectious Diseases Clinical Pharmacist Phone: [phone] 07/20/2020, 8:31 AM

## 2020-07-20 NOTE — Telephone Encounter (Signed)
Post ED Visit - Positive Culture Follow-up: Successful Patient Follow-Up  Culture assessed and recommendations reviewed by:  []  Elenor Quinones, Pharm.D. []  Heide Guile, Pharm.D., BCPS AQ-ID []  Parks Neptune, Pharm.D., BCPS []  Alycia Rossetti, Pharm.D., BCPS []  [place], Pharm.D., BCPS, AAHIVP []  Legrand Como, Pharm.D., BCPS, AAHIVP []  Salome Arnt, PharmD, BCPS []  Johnnette Gourd, PharmD, BCPS []  Hughes Better, PharmD, BCPS []  Leeroy Cha, PharmD Jimmy Footman PharmD  Positive wound culture  []  Patient discharged without antimicrobial prescription and treatment is now indicated [x]  Organism is resistant to prescribed ED discharge antimicrobial []  Patient with positive blood cultures  Changes discussed with ED provider: Carlisle Cater PA New antibiotic prescription d/c doxycycline, start Levofloxacin 500 mg po daily x 7 days Called to [employer] [phone]  Contacted patient   Hazle Nordmann 07/20/2020, 11:45 AM

## 2020-07-25 ENCOUNTER — Emergency Department (HOSPITAL_COMMUNITY): Payer: No Typology Code available for payment source

## 2020-07-25 ENCOUNTER — Inpatient Hospital Stay (HOSPITAL_COMMUNITY)
Admission: EM | Admit: 2020-07-25 | Discharge: 2020-08-06 | DRG: 640 | Disposition: A | Discharge status: Home Health | Payer: No Typology Code available for payment source | Attending: Internal Medicine | Admitting: Internal Medicine

## 2020-07-25 ENCOUNTER — Encounter (HOSPITAL_COMMUNITY): Payer: Self-pay | Admitting: Emergency Medicine

## 2020-07-25 ENCOUNTER — Other Ambulatory Visit: Payer: Self-pay

## 2020-07-25 DIAGNOSIS — Z87891 Personal history of nicotine dependence: Secondary | ICD-10-CM

## 2020-07-25 DIAGNOSIS — N183 Chronic kidney disease, stage 3 unspecified: Secondary | ICD-10-CM

## 2020-07-25 DIAGNOSIS — Z880 Allergy status to penicillin: Secondary | ICD-10-CM

## 2020-07-25 DIAGNOSIS — I959 Hypotension, unspecified: Secondary | ICD-10-CM | POA: Diagnosis not present

## 2020-07-25 DIAGNOSIS — Z833 Family history of diabetes mellitus: Secondary | ICD-10-CM

## 2020-07-25 DIAGNOSIS — E875 Hyperkalemia: Principal | ICD-10-CM

## 2020-07-25 DIAGNOSIS — Z7189 Other specified counseling: Secondary | ICD-10-CM

## 2020-07-25 DIAGNOSIS — Z79891 Long term (current) use of opiate analgesic: Secondary | ICD-10-CM

## 2020-07-25 DIAGNOSIS — R7889 Finding of other specified substances, not normally found in blood: Secondary | ICD-10-CM | POA: Diagnosis not present

## 2020-07-25 DIAGNOSIS — Z7401 Bed confinement status: Secondary | ICD-10-CM

## 2020-07-25 DIAGNOSIS — G4733 Obstructive sleep apnea (adult) (pediatric): Secondary | ICD-10-CM | POA: Diagnosis present

## 2020-07-25 DIAGNOSIS — F419 Anxiety disorder, unspecified: Secondary | ICD-10-CM | POA: Diagnosis present

## 2020-07-25 DIAGNOSIS — G8929 Other chronic pain: Secondary | ICD-10-CM | POA: Diagnosis present

## 2020-07-25 DIAGNOSIS — E785 Hyperlipidemia, unspecified: Secondary | ICD-10-CM | POA: Diagnosis present

## 2020-07-25 DIAGNOSIS — N1831 Chronic kidney disease, stage 3a: Secondary | ICD-10-CM

## 2020-07-25 DIAGNOSIS — I2781 Cor pulmonale (chronic): Secondary | ICD-10-CM | POA: Diagnosis present

## 2020-07-25 DIAGNOSIS — E1122 Type 2 diabetes mellitus with diabetic chronic kidney disease: Secondary | ICD-10-CM | POA: Diagnosis present

## 2020-07-25 DIAGNOSIS — Z20822 Contact with and (suspected) exposure to covid-19: Secondary | ICD-10-CM | POA: Diagnosis present

## 2020-07-25 DIAGNOSIS — I5033 Acute on chronic diastolic (congestive) heart failure: Secondary | ICD-10-CM

## 2020-07-25 DIAGNOSIS — D631 Anemia in chronic kidney disease: Secondary | ICD-10-CM | POA: Diagnosis present

## 2020-07-25 DIAGNOSIS — T464X5A Adverse effect of angiotensin-converting-enzyme inhibitors, initial encounter: Secondary | ICD-10-CM | POA: Diagnosis present

## 2020-07-25 DIAGNOSIS — N179 Acute kidney failure, unspecified: Secondary | ICD-10-CM | POA: Diagnosis not present

## 2020-07-25 DIAGNOSIS — Z79899 Other long term (current) drug therapy: Secondary | ICD-10-CM

## 2020-07-25 DIAGNOSIS — Z885 Allergy status to narcotic agent status: Secondary | ICD-10-CM

## 2020-07-25 DIAGNOSIS — R410 Disorientation, unspecified: Secondary | ICD-10-CM | POA: Diagnosis not present

## 2020-07-25 DIAGNOSIS — Z7984 Long term (current) use of oral hypoglycemic drugs: Secondary | ICD-10-CM

## 2020-07-25 DIAGNOSIS — I130 Hypertensive heart and chronic kidney disease with heart failure and stage 1 through stage 4 chronic kidney disease, or unspecified chronic kidney disease: Secondary | ICD-10-CM | POA: Diagnosis present

## 2020-07-25 DIAGNOSIS — E1140 Type 2 diabetes mellitus with diabetic neuropathy, unspecified: Secondary | ICD-10-CM | POA: Diagnosis present

## 2020-07-25 DIAGNOSIS — D75839 Thrombocytosis, unspecified: Secondary | ICD-10-CM | POA: Diagnosis present

## 2020-07-25 DIAGNOSIS — F32A Depression, unspecified: Secondary | ICD-10-CM | POA: Diagnosis present

## 2020-07-25 DIAGNOSIS — R0902 Hypoxemia: Secondary | ICD-10-CM | POA: Diagnosis not present

## 2020-07-25 DIAGNOSIS — N3000 Acute cystitis without hematuria: Secondary | ICD-10-CM | POA: Diagnosis present

## 2020-07-25 DIAGNOSIS — I5032 Chronic diastolic (congestive) heart failure: Secondary | ICD-10-CM | POA: Diagnosis present

## 2020-07-25 DIAGNOSIS — Z6841 Body Mass Index (BMI) 40.0 and over, adult: Secondary | ICD-10-CM

## 2020-07-25 LAB — COMPREHENSIVE METABOLIC PANEL
ALT: 11 U/L (ref 0–44)
AST: 14 U/L — ABNORMAL LOW (ref 15–41)
Albumin: 3.2 g/dL — ABNORMAL LOW (ref 3.5–5.0)
Alkaline Phosphatase: 71 U/L (ref 38–126)
Anion gap: 5 (ref 5–15)
BUN: 39 mg/dL — ABNORMAL HIGH (ref 8–23)
CO2: 23 mmol/L (ref 22–32)
Calcium: 9.8 mg/dL (ref 8.9–10.3)
Chloride: 114 mmol/L — ABNORMAL HIGH (ref 98–111)
Creatinine, Ser: 1.25 mg/dL — ABNORMAL HIGH (ref 0.44–1.00)
GFR, Estimated: 47 mL/min — ABNORMAL LOW (ref >60)
Glucose, Bld: 110 mg/dL — ABNORMAL HIGH (ref 70–99)
Potassium: 6.3 mmol/L (ref 3.5–5.1)
Sodium: 142 mmol/L (ref 135–145)
Total Bilirubin: 0.6 mg/dL (ref 0.3–1.2)
Total Protein: 6.8 g/dL (ref 6.5–8.1)

## 2020-07-25 LAB — CBC WITH DIFFERENTIAL/PLATELET
Abs Immature Granulocytes: 0.04 10*3/uL (ref 0.00–0.07)
Basophils Absolute: 0 10*3/uL (ref 0.0–0.1)
Basophils Relative: 1 %
Eosinophils Absolute: 0.4 10*3/uL (ref 0.0–0.5)
Eosinophils Relative: 7 %
HCT: 25.1 % — ABNORMAL LOW (ref 36.0–46.0)
Hemoglobin: 7.7 g/dL — ABNORMAL LOW (ref 12.0–15.0)
Immature Granulocytes: 1 %
Lymphocytes Relative: 25 %
Lymphs Abs: 1.6 10*3/uL (ref 0.7–4.0)
MCH: 27.5 pg (ref 26.0–34.0)
MCHC: 30.7 g/dL (ref 30.0–36.0)
MCV: 89.6 fL (ref 80.0–100.0)
Monocytes Absolute: 0.7 10*3/uL (ref 0.1–1.0)
Monocytes Relative: 11 %
Neutro Abs: 3.7 10*3/uL (ref 1.7–7.7)
Neutrophils Relative %: 55 %
Platelets: 456 10*3/uL — ABNORMAL HIGH (ref 150–400)
RBC: 2.8 MIL/uL — ABNORMAL LOW (ref 3.87–5.11)
RDW: 17.7 % — ABNORMAL HIGH (ref 11.5–15.5)
WBC: 6.5 10*3/uL (ref 4.0–10.5)
nRBC: 0 % (ref 0.0–0.2)

## 2020-07-25 LAB — POTASSIUM: Potassium: 6.1 mmol/L — ABNORMAL HIGH (ref 3.5–5.1)

## 2020-07-25 LAB — CBG MONITORING, ED: Glucose-Capillary: 97 mg/dL (ref 70–99)

## 2020-07-25 LAB — GLUCOSE, CAPILLARY: Glucose-Capillary: 114 mg/dL — ABNORMAL HIGH (ref 70–99)

## 2020-07-25 LAB — SARS CORONAVIRUS 2 (TAT 6-24 HRS): SARS Coronavirus 2: NEGATIVE

## 2020-07-25 LAB — BRAIN NATRIURETIC PEPTIDE: B Natriuretic Peptide: 119.6 pg/mL — ABNORMAL HIGH (ref 0.0–100.0)

## 2020-07-25 MED ORDER — TRAZODONE HCL 50 MG PO TABS
50.0000 mg | ORAL_TABLET | Freq: Every day | ORAL | Status: DC
  Administered 2020-07-26: 50 mg via ORAL
  Filled 2020-07-25 (×5): qty 1

## 2020-07-25 MED ORDER — ONDANSETRON HCL 4 MG PO TABS
4.0000 mg | ORAL_TABLET | Freq: Four times a day (QID) | ORAL | Status: DC | PRN
  Administered 2020-08-05: 4 mg via ORAL
  Filled 2020-07-25: qty 1

## 2020-07-25 MED ORDER — DEXTROSE 50 % IV SOLN
25.0000 mL | Freq: Once | INTRAVENOUS | Status: AC
  Administered 2020-07-25: 25 mL via INTRAVENOUS
  Filled 2020-07-25: qty 50

## 2020-07-25 MED ORDER — SODIUM ZIRCONIUM CYCLOSILICATE 10 G PO PACK
10.0000 g | PACK | Freq: Once | ORAL | Status: AC
  Administered 2020-07-25: 10 g via ORAL
  Filled 2020-07-25: qty 1

## 2020-07-25 MED ORDER — SENNOSIDES-DOCUSATE SODIUM 8.6-50 MG PO TABS
1.0000 | ORAL_TABLET | Freq: Every day | ORAL | Status: DC
Start: 2020-07-26 — End: 2020-08-03
  Administered 2020-07-26 – 2020-08-03 (×9): 1 via ORAL
  Filled 2020-07-25 (×9): qty 1

## 2020-07-25 MED ORDER — INSULIN REGULAR BOLUS VIA INFUSION
5.0000 [IU] | Freq: Once | INTRAVENOUS | Status: AC
  Administered 2020-07-25: 5 [IU] via INTRAVENOUS
  Filled 2020-07-25: qty 5

## 2020-07-25 MED ORDER — ACETAMINOPHEN 325 MG PO TABS
650.0000 mg | ORAL_TABLET | Freq: Four times a day (QID) | ORAL | Status: DC | PRN
  Administered 2020-07-25 – 2020-08-06 (×5): 650 mg via ORAL
  Filled 2020-07-25 (×5): qty 2

## 2020-07-25 MED ORDER — ALBUTEROL SULFATE (2.5 MG/3ML) 0.083% IN NEBU
2.5000 mg | INHALATION_SOLUTION | Freq: Once | RESPIRATORY_TRACT | Status: AC
  Administered 2020-07-25: 2.5 mg via RESPIRATORY_TRACT
  Filled 2020-07-25: qty 3

## 2020-07-25 MED ORDER — ACETAMINOPHEN 650 MG RE SUPP: 650.0000 mg | Freq: Four times a day (QID) | RECTAL | Status: DC | PRN

## 2020-07-25 MED ORDER — ONDANSETRON HCL 4 MG/2ML IJ SOLN
4.0000 mg | Freq: Four times a day (QID) | INTRAMUSCULAR | Status: DC | PRN
Start: 2020-07-25 — End: 2020-08-07

## 2020-07-25 MED ORDER — FAMOTIDINE 20 MG PO TABS
20.0000 mg | ORAL_TABLET | Freq: Every day | ORAL | Status: DC
  Administered 2020-07-25 – 2020-08-05 (×12): 20 mg via ORAL
  Filled 2020-07-25 (×12): qty 1

## 2020-07-25 MED ORDER — SODIUM BICARBONATE 8.4 % IV SOLN
50.0000 meq | Freq: Once | INTRAVENOUS | Status: AC
  Administered 2020-07-25: 50 meq via INTRAVENOUS
  Filled 2020-07-25: qty 50

## 2020-07-25 MED ORDER — INSULIN ASPART 100 UNIT/ML IJ SOLN
0.0000 [IU] | Freq: Every day | INTRAMUSCULAR | Status: DC
  Filled 2020-07-25: qty 0.05

## 2020-07-25 MED ORDER — CALCIUM GLUCONATE 10 % IV SOLN
1.0000 g | Freq: Once | INTRAVENOUS | Status: AC
  Administered 2020-07-25: 1 g via INTRAVENOUS
  Filled 2020-07-25: qty 10

## 2020-07-25 MED ORDER — BUSPIRONE HCL 5 MG PO TABS
5.0000 mg | ORAL_TABLET | Freq: Three times a day (TID) | ORAL | Status: DC
  Administered 2020-07-25 – 2020-08-06 (×36): 5 mg via ORAL
  Filled 2020-07-25 (×36): qty 1

## 2020-07-25 MED ORDER — DULOXETINE HCL 30 MG PO CPEP
30.0000 mg | ORAL_CAPSULE | Freq: Two times a day (BID) | ORAL | Status: DC
  Administered 2020-07-25 – 2020-08-06 (×24): 30 mg via ORAL
  Filled 2020-07-25 (×24): qty 1

## 2020-07-25 MED ORDER — OXYCODONE HCL 5 MG PO TABS
5.0000 mg | ORAL_TABLET | ORAL | Status: DC | PRN
  Administered 2020-07-26 – 2020-08-05 (×20): 5 mg via ORAL
  Filled 2020-07-25 (×21): qty 1

## 2020-07-25 MED ORDER — ALBUTEROL SULFATE (2.5 MG/3ML) 0.083% IN NEBU: 2.5000 mg | INHALATION_SOLUTION | RESPIRATORY_TRACT | Status: DC | PRN

## 2020-07-25 MED ORDER — ENOXAPARIN SODIUM 80 MG/0.8ML IJ SOSY
70.0000 mg | PREFILLED_SYRINGE | Freq: Every day | INTRAMUSCULAR | Status: DC
  Administered 2020-07-25: 70 mg via SUBCUTANEOUS
  Filled 2020-07-25: qty 0.7

## 2020-07-25 MED ORDER — SODIUM CHLORIDE 0.9 % IV SOLN: INTRAVENOUS | Status: DC

## 2020-07-25 MED ORDER — ALPRAZOLAM 0.5 MG PO TABS
0.5000 mg | ORAL_TABLET | Freq: Three times a day (TID) | ORAL | Status: DC | PRN
  Administered 2020-07-25 – 2020-08-05 (×22): 0.5 mg via ORAL
  Filled 2020-07-25 (×24): qty 1

## 2020-07-25 MED ORDER — INSULIN ASPART 100 UNIT/ML IJ SOLN
0.0000 [IU] | Freq: Three times a day (TID) | INTRAMUSCULAR | Status: DC
  Administered 2020-07-27 – 2020-07-31 (×2): 3 [IU] via SUBCUTANEOUS
  Filled 2020-07-25: qty 0.2

## 2020-07-25 MED ORDER — FUROSEMIDE 10 MG/ML IJ SOLN
60.0000 mg | Freq: Once | INTRAMUSCULAR | Status: AC
  Administered 2020-07-25: 60 mg via INTRAVENOUS
  Filled 2020-07-25: qty 8

## 2020-07-25 MED ORDER — FUROSEMIDE 10 MG/ML IJ SOLN: 60.0000 mg | Freq: Two times a day (BID) | INTRAMUSCULAR | Status: DC

## 2020-07-25 NOTE — ED Notes (Signed)
Portable chest 

## 2020-07-25 NOTE — ED Notes (Signed)
Patient is ready for admission 
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
## 2020-07-25 NOTE — ED Notes (Signed)
Neita Goodnight, RN was sent a message regarding admission
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
## 2020-07-25 NOTE — ED Notes (Signed)
Called floor in regards to admission
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
## 2020-07-25 NOTE — ED Triage Notes (Signed)
Patient arrives via EMS from her home.  Patient reported that she was called by her Primary Care Doctor today that she needed to come to the ER for abnormal labs results (k+)  Patient is morbid obese and states that she feels like she has a lot of fluid on her

## 2020-07-25 NOTE — ED Notes (Signed)
K 6.3 w/ no hemolysis, MD Zenia Resides notified.

## 2020-07-25 NOTE — ED Notes (Signed)
Sandwich and soda provided to the patient per Dr Ayesha Rumpf instruction

## 2020-07-26 ENCOUNTER — Encounter (HOSPITAL_COMMUNITY): Payer: Self-pay | Admitting: Internal Medicine

## 2020-07-26 ENCOUNTER — Inpatient Hospital Stay (HOSPITAL_COMMUNITY): Payer: No Typology Code available for payment source

## 2020-07-26 ENCOUNTER — Observation Stay (HOSPITAL_COMMUNITY): Payer: No Typology Code available for payment source

## 2020-07-26 DIAGNOSIS — R410 Disorientation, unspecified: Secondary | ICD-10-CM | POA: Diagnosis not present

## 2020-07-26 DIAGNOSIS — I5033 Acute on chronic diastolic (congestive) heart failure: Secondary | ICD-10-CM | POA: Diagnosis present

## 2020-07-26 DIAGNOSIS — I2781 Cor pulmonale (chronic): Secondary | ICD-10-CM | POA: Diagnosis present

## 2020-07-26 DIAGNOSIS — E875 Hyperkalemia: Secondary | ICD-10-CM | POA: Diagnosis present

## 2020-07-26 DIAGNOSIS — Z7984 Long term (current) use of oral hypoglycemic drugs: Secondary | ICD-10-CM | POA: Diagnosis not present

## 2020-07-26 DIAGNOSIS — E785 Hyperlipidemia, unspecified: Secondary | ICD-10-CM | POA: Diagnosis present

## 2020-07-26 DIAGNOSIS — R0602 Shortness of breath: Secondary | ICD-10-CM

## 2020-07-26 DIAGNOSIS — I5031 Acute diastolic (congestive) heart failure: Secondary | ICD-10-CM | POA: Diagnosis not present

## 2020-07-26 DIAGNOSIS — Z7189 Other specified counseling: Secondary | ICD-10-CM | POA: Diagnosis not present

## 2020-07-26 DIAGNOSIS — E1122 Type 2 diabetes mellitus with diabetic chronic kidney disease: Secondary | ICD-10-CM | POA: Diagnosis present

## 2020-07-26 DIAGNOSIS — N3000 Acute cystitis without hematuria: Secondary | ICD-10-CM | POA: Diagnosis not present

## 2020-07-26 DIAGNOSIS — N179 Acute kidney failure, unspecified: Secondary | ICD-10-CM | POA: Diagnosis present

## 2020-07-26 DIAGNOSIS — Z20822 Contact with and (suspected) exposure to covid-19: Secondary | ICD-10-CM | POA: Diagnosis present

## 2020-07-26 DIAGNOSIS — F32A Depression, unspecified: Secondary | ICD-10-CM | POA: Diagnosis present

## 2020-07-26 DIAGNOSIS — R5381 Other malaise: Secondary | ICD-10-CM | POA: Diagnosis not present

## 2020-07-26 DIAGNOSIS — F419 Anxiety disorder, unspecified: Secondary | ICD-10-CM | POA: Diagnosis present

## 2020-07-26 DIAGNOSIS — D631 Anemia in chronic kidney disease: Secondary | ICD-10-CM | POA: Diagnosis present

## 2020-07-26 DIAGNOSIS — G8929 Other chronic pain: Secondary | ICD-10-CM | POA: Diagnosis present

## 2020-07-26 DIAGNOSIS — Z743 Need for continuous supervision: Secondary | ICD-10-CM | POA: Diagnosis not present

## 2020-07-26 DIAGNOSIS — I130 Hypertensive heart and chronic kidney disease with heart failure and stage 1 through stage 4 chronic kidney disease, or unspecified chronic kidney disease: Secondary | ICD-10-CM | POA: Diagnosis present

## 2020-07-26 DIAGNOSIS — I959 Hypotension, unspecified: Secondary | ICD-10-CM | POA: Diagnosis not present

## 2020-07-26 DIAGNOSIS — Z87891 Personal history of nicotine dependence: Secondary | ICD-10-CM | POA: Diagnosis not present

## 2020-07-26 DIAGNOSIS — D75839 Thrombocytosis, unspecified: Secondary | ICD-10-CM | POA: Diagnosis present

## 2020-07-26 DIAGNOSIS — N1831 Chronic kidney disease, stage 3a: Secondary | ICD-10-CM | POA: Diagnosis present

## 2020-07-26 DIAGNOSIS — R531 Weakness: Secondary | ICD-10-CM | POA: Diagnosis not present

## 2020-07-26 DIAGNOSIS — Z515 Encounter for palliative care: Secondary | ICD-10-CM | POA: Diagnosis not present

## 2020-07-26 DIAGNOSIS — E1140 Type 2 diabetes mellitus with diabetic neuropathy, unspecified: Secondary | ICD-10-CM | POA: Diagnosis present

## 2020-07-26 DIAGNOSIS — Z79891 Long term (current) use of opiate analgesic: Secondary | ICD-10-CM | POA: Diagnosis not present

## 2020-07-26 DIAGNOSIS — Z833 Family history of diabetes mellitus: Secondary | ICD-10-CM | POA: Diagnosis not present

## 2020-07-26 DIAGNOSIS — Z6841 Body Mass Index (BMI) 40.0 and over, adult: Secondary | ICD-10-CM | POA: Diagnosis not present

## 2020-07-26 DIAGNOSIS — G4733 Obstructive sleep apnea (adult) (pediatric): Secondary | ICD-10-CM | POA: Diagnosis present

## 2020-07-26 LAB — COMPREHENSIVE METABOLIC PANEL
ALT: 11 U/L (ref 0–44)
AST: 12 U/L — ABNORMAL LOW (ref 15–41)
Albumin: 2.9 g/dL — ABNORMAL LOW (ref 3.5–5.0)
Alkaline Phosphatase: 61 U/L (ref 38–126)
Anion gap: 6 (ref 5–15)
BUN: 44 mg/dL — ABNORMAL HIGH (ref 8–23)
CO2: 24 mmol/L (ref 22–32)
Calcium: 9.5 mg/dL (ref 8.9–10.3)
Chloride: 110 mmol/L (ref 98–111)
Creatinine, Ser: 1.7 mg/dL — ABNORMAL HIGH (ref 0.44–1.00)
GFR, Estimated: 33 mL/min — ABNORMAL LOW (ref >60)
Glucose, Bld: 98 mg/dL (ref 70–99)
Potassium: 6.2 mmol/L — ABNORMAL HIGH (ref 3.5–5.1)
Sodium: 140 mmol/L (ref 135–145)
Total Bilirubin: 0.1 mg/dL — ABNORMAL LOW (ref 0.3–1.2)
Total Protein: 6.3 g/dL — ABNORMAL LOW (ref 6.5–8.1)

## 2020-07-26 LAB — CBC
HCT: 23.4 % — ABNORMAL LOW (ref 36.0–46.0)
Hemoglobin: 6.9 g/dL — CL (ref 12.0–15.0)
MCH: 27 pg (ref 26.0–34.0)
MCHC: 29.5 g/dL — ABNORMAL LOW (ref 30.0–36.0)
MCV: 91.4 fL (ref 80.0–100.0)
Platelets: 379 10*3/uL (ref 150–400)
RBC: 2.56 MIL/uL — ABNORMAL LOW (ref 3.87–5.11)
RDW: 17.5 % — ABNORMAL HIGH (ref 11.5–15.5)
WBC: 6 10*3/uL (ref 4.0–10.5)
nRBC: 0.3 % — ABNORMAL HIGH (ref 0.0–0.2)

## 2020-07-26 LAB — ECHOCARDIOGRAM COMPLETE
Area-P 1/2: 6.71 cm2
Calc EF: 61 %
Height: 62 in
S' Lateral: 2.4 cm
Single Plane A2C EF: 59.3 %
Single Plane A4C EF: 63.6 %
Weight: 5375.7 oz

## 2020-07-26 LAB — GLUCOSE, CAPILLARY
Glucose-Capillary: 75 mg/dL (ref 70–99)
Glucose-Capillary: 82 mg/dL (ref 70–99)
Glucose-Capillary: 111 mg/dL — ABNORMAL HIGH (ref 70–99)
Glucose-Capillary: 87 mg/dL (ref 70–99)

## 2020-07-26 LAB — POTASSIUM: Potassium: 5.9 mmol/L — ABNORMAL HIGH (ref 3.5–5.1)

## 2020-07-26 LAB — PREPARE RBC (CROSSMATCH)

## 2020-07-26 MED ORDER — DIPHENHYDRAMINE HCL 25 MG PO CAPS
25.0000 mg | ORAL_CAPSULE | Freq: Four times a day (QID) | ORAL | Status: DC | PRN
  Administered 2020-07-26: 25 mg via ORAL
  Filled 2020-07-26: qty 1

## 2020-07-26 MED ORDER — SODIUM ZIRCONIUM CYCLOSILICATE 10 G PO PACK: 10.0000 g | PACK | Freq: Once | ORAL | Status: DC

## 2020-07-26 MED ORDER — DEXTROSE 50 % IV SOLN
25.0000 mL | Freq: Once | INTRAVENOUS | Status: AC
  Administered 2020-07-26: 25 mL via INTRAVENOUS
  Filled 2020-07-26: qty 50

## 2020-07-26 MED ORDER — INSULIN REGULAR BOLUS VIA INFUSION: 5.0000 [IU] | Freq: Once | INTRAVENOUS | Status: DC

## 2020-07-26 MED ORDER — CALCIUM GLUCONATE 10 % IV SOLN
1.0000 g | Freq: Once | INTRAVENOUS | Status: AC
  Administered 2020-07-26: 1 g via INTRAVENOUS
  Filled 2020-07-26 (×2): qty 10

## 2020-07-26 MED ORDER — INSULIN ASPART 100 UNIT/ML IV SOLN
5.0000 [IU] | Freq: Once | INTRAVENOUS | Status: AC
  Administered 2020-07-26: 5 [IU] via INTRAVENOUS

## 2020-07-26 MED ORDER — DIPHENHYDRAMINE HCL 12.5 MG/5ML PO ELIX
12.5000 mg | ORAL_SOLUTION | Freq: Three times a day (TID) | ORAL | Status: AC | PRN
Start: 2020-07-26 — End: 2020-07-26
  Administered 2020-07-26: 12.5 mg via ORAL
  Filled 2020-07-26: qty 5

## 2020-07-26 MED ORDER — SODIUM CHLORIDE 0.9% IV SOLUTION: Freq: Once | INTRAVENOUS | Status: AC

## 2020-07-26 MED ORDER — ALBUTEROL SULFATE (2.5 MG/3ML) 0.083% IN NEBU
2.5000 mg | INHALATION_SOLUTION | Freq: Once | RESPIRATORY_TRACT | Status: AC
  Administered 2020-07-26: 2.5 mg via RESPIRATORY_TRACT
  Filled 2020-07-26: qty 3

## 2020-07-26 MED ORDER — FUROSEMIDE 10 MG/ML IJ SOLN
40.0000 mg | Freq: Two times a day (BID) | INTRAMUSCULAR | Status: DC
  Administered 2020-07-26 – 2020-07-28 (×4): 40 mg via INTRAVENOUS
  Filled 2020-07-26 (×4): qty 4

## 2020-07-26 MED ORDER — SODIUM BICARBONATE 8.4 % IV SOLN
50.0000 meq | Freq: Once | INTRAVENOUS | Status: AC
  Administered 2020-07-26: 50 meq via INTRAVENOUS
  Filled 2020-07-26: qty 50

## 2020-07-26 MED ORDER — SODIUM ZIRCONIUM CYCLOSILICATE 10 G PO PACK
10.0000 g | PACK | Freq: Two times a day (BID) | ORAL | Status: DC
  Administered 2020-07-26 – 2020-07-28 (×5): 10 g via ORAL
  Filled 2020-07-26 (×5): qty 1

## 2020-07-26 MED ORDER — SODIUM CHLORIDE 0.9 % IV SOLN: INTRAVENOUS | Status: DC

## 2020-07-26 NOTE — Plan of Care (Signed)

## 2020-07-26 NOTE — Progress Notes (Signed)
Notified on call about patient's DBP being low. Patient also refused trazodone. Patient stated that it makes her have nightmares.
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
## 2020-07-26 NOTE — Consult Note (Addendum)
[facility] Nurse Consult Note: Patient receiving care in [phone] Reason for Consult: Abd and left thigh wound Wound type: Full thickness Pressure Injury POA: NA Measurement: Bedside RN to measure upon first dressing change. Wound bed: See photos Drainage (amount, consistency, odor)  Periwound: Intact Dressing procedure/placement/frequency: Cleanse both wounds with NS then place a piece of Aquacel Ag+ Advantage [name] # [phone]) in the wound, cover with 4 x 4 and secure with foam dressing. Change BID. Use normal saline not sterile water to loosen the Aquacel if needed when performing dressing change.   Monitor the wound area(s) for worsening of condition such as: Signs/symptoms of infection, increase in size, development of or worsening of odor, development of pain, or increased pain at the affected locations.   Notify the medical team if any of these develop.  Thank you for the consult. [facility] nurse will not follow at this time.   Please re-consult the [facility] team if needed.  Cathlean Marseilles Tamala Julian, MSN, RN, [place], Lysle Pearl, [facility] Wound Treatment Associate Pager [phone]

## 2020-07-26 NOTE — Progress Notes (Addendum)
PT Cancellation Note  Patient Details Name: [name] [name] MRN: [id number] DOB: Jun 24, 1955   Cancelled Treatment:    Reason Eval/Treat Not Completed: Medical issues which prohibited therapy--hgb 6.9, K+>6. Will hold PT for now. Will check back another day/time once medically ready. Thanks.    [facility]  Office: [phone] Pager: [phone]
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
## 2020-07-26 NOTE — Progress Notes (Signed)
  Echocardiogram 2D Echocardiogram has been performed.  [name] [name] 07/26/2020, 10:34 AM

## 2020-07-27 LAB — TYPE AND SCREEN
ABO/RH(D): B POS
Antibody Screen: NEGATIVE
Unit division: 0

## 2020-07-27 LAB — BASIC METABOLIC PANEL
Anion gap: 6 (ref 5–15)
BUN: 50 mg/dL — ABNORMAL HIGH (ref 8–23)
CO2: 26 mmol/L (ref 22–32)
Calcium: 9.4 mg/dL (ref 8.9–10.3)
Chloride: 108 mmol/L (ref 98–111)
Creatinine, Ser: 2.1 mg/dL — ABNORMAL HIGH (ref 0.44–1.00)
GFR, Estimated: 25 mL/min — ABNORMAL LOW (ref >60)
Glucose, Bld: 84 mg/dL (ref 70–99)
Potassium: 5.8 mmol/L — ABNORMAL HIGH (ref 3.5–5.1)
Sodium: 140 mmol/L (ref 135–145)

## 2020-07-27 LAB — MAGNESIUM: Magnesium: 1.6 mg/dL — ABNORMAL LOW (ref 1.7–2.4)

## 2020-07-27 LAB — CBC WITH DIFFERENTIAL/PLATELET
Abs Immature Granulocytes: 0.02 10*3/uL (ref 0.00–0.07)
Basophils Absolute: 0 10*3/uL (ref 0.0–0.1)
Basophils Relative: 0 %
Eosinophils Absolute: 0.5 10*3/uL (ref 0.0–0.5)
Eosinophils Relative: 7 %
HCT: 27.8 % — ABNORMAL LOW (ref 36.0–46.0)
Hemoglobin: 8.8 g/dL — ABNORMAL LOW (ref 12.0–15.0)
Immature Granulocytes: 0 %
Lymphocytes Relative: 29 %
Lymphs Abs: 2 10*3/uL (ref 0.7–4.0)
MCH: 28.2 pg (ref 26.0–34.0)
MCHC: 31.7 g/dL (ref 30.0–36.0)
MCV: 89.1 fL (ref 80.0–100.0)
Monocytes Absolute: 0.8 10*3/uL (ref 0.1–1.0)
Monocytes Relative: 12 %
Neutro Abs: 3.5 10*3/uL (ref 1.7–7.7)
Neutrophils Relative %: 52 %
Platelets: 382 10*3/uL (ref 150–400)
RBC: 3.12 MIL/uL — ABNORMAL LOW (ref 3.87–5.11)
RDW: 16.9 % — ABNORMAL HIGH (ref 11.5–15.5)
WBC: 6.9 10*3/uL (ref 4.0–10.5)
nRBC: 0.3 % — ABNORMAL HIGH (ref 0.0–0.2)

## 2020-07-27 LAB — SODIUM, URINE, RANDOM: Sodium, Ur: 129 mmol/L

## 2020-07-27 LAB — URINALYSIS, ROUTINE W REFLEX MICROSCOPIC
Bilirubin Urine: NEGATIVE
Glucose, UA: NEGATIVE mg/dL
Hgb urine dipstick: NEGATIVE
Ketones, ur: NEGATIVE mg/dL
Leukocytes,Ua: NEGATIVE
Nitrite: NEGATIVE
Protein, ur: NEGATIVE mg/dL
Specific Gravity, Urine: 1.008 (ref 1.005–1.030)
pH: 5 (ref 5.0–8.0)

## 2020-07-27 LAB — GLUCOSE, CAPILLARY
Glucose-Capillary: 79 mg/dL (ref 70–99)
Glucose-Capillary: 121 mg/dL — ABNORMAL HIGH (ref 70–99)
Glucose-Capillary: 103 mg/dL — ABNORMAL HIGH (ref 70–99)
Glucose-Capillary: 107 mg/dL — ABNORMAL HIGH (ref 70–99)

## 2020-07-27 LAB — BPAM RBC
Blood Product Expiration Date: 202206142359
ISSUE DATE / TIME: 202206051620
Unit Type and Rh: 7300

## 2020-07-27 LAB — CREATININE, URINE, RANDOM: Creatinine, Urine: 37.5 mg/dL

## 2020-07-27 LAB — HEMOGLOBIN A1C
Hgb A1c MFr Bld: 5.3 % (ref 4.8–5.6)
Mean Plasma Glucose: 105 mg/dL

## 2020-07-27 MED ORDER — ALBUMIN HUMAN 25 % IV SOLN
25.0000 g | Freq: Four times a day (QID) | INTRAVENOUS | Status: AC
  Administered 2020-07-27 – 2020-07-28 (×3): 25 g via INTRAVENOUS
  Filled 2020-07-27 (×3): qty 100

## 2020-07-27 MED ORDER — MIDODRINE HCL 5 MG PO TABS
10.0000 mg | ORAL_TABLET | Freq: Three times a day (TID) | ORAL | Status: DC
  Administered 2020-07-27 – 2020-07-30 (×6): 10 mg via ORAL
  Filled 2020-07-27 (×8): qty 2

## 2020-07-27 NOTE — Evaluation (Signed)
Physical Therapy Evaluation Patient Details Name: [name] [name] MRN: [id number] DOB: 07-15-1955 Today's Date: 07/27/2020   History of Present Illness  [name] [name] is a 65yo female whot presents with elevated potassium. PMH: diabetes mellitus type 2, diabetic neuropathy, HTN, chronic diastolic CHF, chronic anemia, chronic kidney disease stage III, morbid obesity, hemorrhagic shock secondary to GI bleeding requiring embolization, OSA on CPAP, anxiety and depression  Clinical Impression  Pt admitted with above diagnosis. Pt with CNAs assisting 9-12 and again 5pm-7pm, using hoyer lift to transfer pt from hospital bed into recliner in living room. Pt states meals [employer] wheels provide meals or boyfriend grocery shops and provides meals. Pt appears to have poor insight to currently functional status, states goal is to "walk today" after telling therapist she hasn't walked in >1 year. Pt currently requiring +3 for supine<>sit transfer and max A +2 for rolling to change linen under pt. Pt tolerates remaining sitting EOB for ~8 minutes with min-mod A due to heavy posterior lean and therapist blocking BLE from sliding. Educated pt on assistance required and recommending 24 hr assist at d/c, pt not interested in SNF but also unsure if CNAs can stay additional hours to assist. Pt currently with functional limitations due to the deficits listed below (see PT Problem List). Pt will benefit from skilled PT to increase their independence and safety with mobility to allow discharge to the venue listed below.       Follow Up Recommendations SNF;Supervision/Assistance - 24 hour    Equipment Recommendations  None recommended by PT    Recommendations for Other Services       Precautions / Restrictions Precautions Precautions: Fall Restrictions Weight Bearing Restrictions: No (pt reports broken LLE and R shoulder)      Mobility  Bed Mobility Overal bed mobility: Needs Assistance Bed Mobility: Supine to  Sit;Sit to Supine;Rolling Rolling: Max assist;+2 for physical assistance  Supine to sit: Total assist Sit to supine: Total assist   General bed mobility comments: max +3 for sit to supine with pt attempting to use bedrails, limited due to R shoulder; max A +3 for supine to sit to lift BLE back into bed and scoot pt up in bed with bedpad; pt able to reach for bedrails to assist in rolling trunk into sidelying, max A +2 to rotate lower body    Transfers  General transfer comment: pt reports using hoyer at home  Ambulation/Gait  General Gait Details: pt reports hasn't ambulated in >1 year  Stairs            Wheelchair Mobility    Modified Rankin (Stroke Patients Only)       Balance Overall balance assessment: Needs assistance Sitting-balance support: Feet supported;Bilateral upper extremity supported Sitting balance-Leahy Scale: Poor Sitting balance - Comments: mod-min A to sit EOB for ~8 minutes, strong posterior lean, therapist blocking BLE from sliding        Pertinent Vitals/Pain Pain Assessment: Faces Faces Pain Scale: Hurts little more Pain Location: generalized with mobility Pain Descriptors / Indicators: Discomfort;Grimacing;Guarding Pain Intervention(s): Limited activity within patient's tolerance;Monitored during session    Home Living Family/patient expects to be discharged to:: Private residence Living Arrangements: Alone Available Help at Discharge: Family;Personal care attendant;Available PRN/intermittently (CNA 9-12 and 5pm-7pm, 7days/wk) Type of Home: Apartment Home Access: Ramped entrance     Home Layout: One level Home Equipment: Hospital bed;Walker - 4 wheels;Wheelchair - Education administrator (comment);Shower seat Product manager)      Prior Function Level of Independence: Needs assistance  Gait / Transfers Assistance Needed: hoyer lift at home, aides transfer pt bed<>recliner  ADL's / Homemaking Assistance Needed: bed level, dependent  Comments: Pt  reports boyfriend completes grocery shopping, assists ocassionally throughout the day. Pt reports [employer] delivers food sometimes. Pt states "sometimes" sits EOB, also that she fell and broke her L leg 1 year ago today (June 6).     Hand Dominance   Dominant Hand: Right    Extremity/Trunk Assessment   Upper Extremity Assessment Upper Extremity Assessment: Generalized weakness;RUE deficits/detail RUE Deficits / Details: shoulder flexion <90 deg    Lower Extremity Assessment Lower Extremity Assessment: Generalized weakness;RLE deficits/detail;LLE deficits/detail RLE Deficits / Details: ankle AROM ~50%, knee and hip AROM ~25% RLE Sensation: decreased light touch RLE Coordination: WNL LLE Deficits / Details: ankle AROM ~50%, knee and hip AROM ~25%; supine LLE resting in significant external rotation, able to rotate internally but not to midline LLE Sensation: decreased light touch LLE Coordination: WNL       Communication   Communication: No difficulties  Cognition Arousal/Alertness: Awake/alert Behavior During Therapy: WFL for tasks assessed/performed Overall Cognitive Status: No family/caregiver present to determine baseline cognitive functioning  General Comments: Pt appears to zone out at times looking off to the side, easy to redirect to task. Pt appears to have unrealistic goals, stating goal is to "walk today" after telling therapist she hasn't walked in >1 year.      General Comments      Exercises     Assessment/Plan    PT Assessment Patient needs continued PT services  PT Problem List Decreased strength;Decreased range of motion;Decreased activity tolerance;Decreased balance;Decreased mobility;Decreased knowledge of use of DME;Decreased safety awareness;Cardiopulmonary status limiting activity;Impaired sensation;Obesity;Decreased skin integrity;Pain       PT Treatment Interventions DME instruction;Gait training;Functional mobility training;Therapeutic  activities;Therapeutic exercise;Balance training;Neuromuscular re-education;Patient/family education;Wheelchair mobility training    PT Goals (Current goals can be found in the Care Plan section)  Acute Rehab PT Goals Patient Stated Goal: "walk today" PT Goal Formulation: With patient Time For Goal Achievement: 08/10/20 Potential to Achieve Goals: Fair    Frequency Min 2X/week   Barriers to discharge        Co-evaluation               AM-PAC PT "6 Clicks" Mobility  Outcome Measure Help needed turning from your back to your side while in a flat bed without using bedrails?: Total Help needed moving from lying on your back to sitting on the side of a flat bed without using bedrails?: Total Help needed moving to and from a bed to a chair (including a wheelchair)?: Total Help needed standing up from a chair using your arms (e.g., wheelchair or bedside chair)?: Total Help needed to walk in hospital room?: Total Help needed climbing 3-5 steps with a railing? : Total 6 Click Score: 6    End of Session   Activity Tolerance: Patient limited by fatigue Patient left: in bed;with call bell/phone within reach;with bed alarm set Nurse Communication: Mobility status;Need for lift equipment PT Visit Diagnosis: Other abnormalities of gait and mobility (R26.89);Muscle weakness (generalized) (M62.81);Difficulty in walking, not elsewhere classified (R26.2)    Time: 4709-6283 PT Time Calculation (min) (ACUTE ONLY): 34 min   Charges:   PT Evaluation $PT Eval Moderate Complexity: 1 Mod PT Treatments $Therapeutic Activity: 8-22 mins         Tori Dayshon Roback PT, DPT 07/27/20, 4:14 PM

## 2020-07-27 NOTE — Progress Notes (Signed)
[place] visited pt. per [facility] for prayer; initially attempted visit this AM but pt. was asleep.  Pt. awake and lying in bed eating sherbet cup when [place] arrived.  She shared that she was brought to the hospital because some routine labwork at the [place] revealed the need to lower some kind of level in her blood.  Pt. says she lives alone but has some support from her two sisters and other family who live nearby.  She requested prayer for blessing and divine presence.  Pt. grateful for visit and requests follow-up if possible; chaplains remains available as needed.   Lindaann Pascal PRN Chaplain Pager: [phone]

## 2020-07-27 NOTE — Progress Notes (Signed)
Noted spoke with patient regarding neuropathy pain. Expressed to patient she is on Gabapentin and we will see about restarting. PT EXPRESSED THAT MED NO LONGER WORKS  AND YOU HAVE RESEARCHED [facility] AS AN ALTERNATIVE. PT STATES SHE WANTS TO SPEAK WITH HER DOCTOR REGARDING MAKING THAT CHANGE. ON GOING ASSESSMENT

## 2020-07-27 NOTE — Progress Notes (Signed)
[facility] NEPHROLOGY PROGRESS NOTE  Assessment/ Plan: Pt is a 65 y.o. yo female with morbid obesity, bedbound, DM, HTN, neuropathy, chronic diastolic CHF, anemia, CKD stage III, OSA on CPAP, anxiety depression who was sent by her PCP to ER for abnormal lab result, seen as a consultation for AKI on CKD and hyperkalemia.  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  Urinalysis pending.  Kidney ultrasound limited because of morbid obesity. Nonoliguric on IV Lasix.  Creatinine level trending up probably because of hypotension.  I will start 3 doses of IV albumin and add midodrine to augment blood pressure.  Agree with continuing IV Lasix for fluid overload. Strict ins and outs, daily lab.  Hopefully she will have renal recovery.  # Hyperkalemia: Due to lisinopril and AKI.  Currently medically managing with Lasix, Lokelma.  Monitor lab.    #Hypotension: Starting midodrine.  Discontinue lisinopril.  Monitor blood pressure.  #Acute on chronic diastolic CHF: Morbidly obese, hard to assess accurate volume status.  She does have peripheral edema.  We will continue IV Lasix as discussed above.  # Anemia: Received a unit of blood transfusion on 6/5.  I will check iron studies.  Monitor hemoglobin.  Discussed with the primary team. Subjective: Seen and examined bedside.  She is lying on bed.  Reports some shortness of breath but denies nausea vomiting chest pain.  Urine output around 900 cc.  No other event. Objective Vital signs in last 24 hours: Vitals:   07/26/20 2100 07/27/20 0500 07/27/20 0700 07/27/20 1207  BP: (!) 118/40 (!) 91/48  (!) 94/41  Pulse: 99 80  86  Resp: 20 18    Temp: 97.7 F (36.5 C) 98.9 F (37.2 C)  98.9 F (37.2 C)  TempSrc: Oral Oral  Oral  SpO2: 97% 93%  95%  Weight:   (!) 150.8 kg   Height:       Weight change: 14.3 kg  Intake/Output Summary (Last 24  hours) at 07/27/2020 1404 Last data filed at 07/27/2020 1000 Gross per 24 hour  Intake 1707.34 ml  Output 900 ml  Net 807.34 ml       Labs: Basic Metabolic Panel: Recent Labs  Lab 07/25/20 1432 07/25/20 2027 07/26/20 0317 07/26/20 1136 07/27/20 0405  NA 142  --  140  --  140  K 6.3*   < > 6.2* 5.9* 5.8*  CL 114*  --  110  --  108  CO2 23  --  24  --  26  GLUCOSE 110*  --  98  --  84  BUN 39*  --  44*  --  50*  CREATININE 1.25*  --  1.70*  --  2.10*  CALCIUM 9.8  --  9.5  --  9.4   < > = values in this interval not displayed.   Liver Function Tests: Recent Labs  Lab 07/25/20 1432 07/26/20 0317  AST 14* 12*  ALT 11 11  ALKPHOS 71 61  BILITOT 0.6 0.1*  PROT 6.8 6.3*  ALBUMIN 3.2* 2.9*   No results for input(s): LIPASE, AMYLASE in the last 168 hours. No results for input(s): AMMONIA in the last 168 hours. CBC: Recent Labs  Lab 07/25/20 1432 07/26/20 0317 07/27/20 0405  WBC 6.5 6.0 6.9  NEUTROABS 3.7  --  3.5  HGB 7.7* 6.9* 8.8*  HCT 25.1* 23.4* 27.8*  MCV 89.6 91.4 89.1  PLT 456*  379 382   Cardiac Enzymes: No results for input(s): CKTOTAL, CKMB, CKMBINDEX, TROPONINI in the last 168 hours. CBG: Recent Labs  Lab 07/26/20 0754 07/26/20 1131 07/26/20 1559 07/27/20 0734 07/27/20 1204  GLUCAP 82 111* 87 79 121*    Iron Studies: No results for input(s): IRON, TIBC, TRANSFERRIN, FERRITIN in the last 72 hours. Studies/Results: US RENAL  Result Date: 07/26/2020 CLINICAL DATA:  Acute renal failure superimposed upon stage 3 chronic kidney disease EXAM: RENAL / URINARY TRACT ULTRASOUND COMPLETE COMPARISON:  06/28/2014 FINDINGS: Right Kidney: Renal measurements: 11.6 x 4.9 x 5.0 cm = volume: 148 mL. The lower pole is slightly obscured by overlying bowel gas. Echogenicity within normal limits. No mass or hydronephrosis visualized within the visualized portion. Left Kidney: Obscured by overlying bowel gas Bladder: Obscured by overlying bowel gas Other: None.  IMPRESSION: Technically limited examination with nonvisualization of the left kidney and bladder. Normal appearance of the visualized right kidney. Electronically Signed   By: Fidela Salisbury MD   On: 07/26/2020 21:33   DG Chest Port 1 View  Result Date: 07/25/2020 CLINICAL DATA:  Shortness of breath, abnormal labs, diabetes mellitus, hypertension EXAM: PORTABLE CHEST 1 VIEW COMPARISON:  Portable exam 1517 hours compared to 11/05/2019 FINDINGS: Enlargement of cardiac silhouette. Pulmonary vascularity normal. Prominent superior mediastinum which may be related to lordotic positioning. Accentuated markings in the mid to lower RIGHT lung, could be related to underpenetration though infiltrate not completely excluded. Mild central peribronchial thickening, chronic. Remaining lungs clear. No pleural effusion or pneumothorax. IMPRESSION: Enlargement of cardiac silhouette. Slightly accentuated markings in the mid to lower RIGHT lung question infiltrate versus artifact related to underpenetration. Remaining lungs grossly clear. Electronically Signed   By: Lavonia Dana M.D.   On: 07/25/2020 15:30   ECHOCARDIOGRAM COMPLETE  Result Date: 07/26/2020    ECHOCARDIOGRAM REPORT   Patient Name:   [name] [name] Date of Exam: 07/26/2020 Medical Rec #:  [id number]          Height:       62.0 in Accession #:    [id number]         Weight:       336.0 lb Date of Birth:  [birth year]          BSA:          2.383 m Patient Age:    53 years           BP:           117/45 mmHg Patient Gender: F                  HR:           91 bpm. Exam Location:  Inpatient Procedure: 2D Echo, Cardiac Doppler and Color Doppler Indications:    R06.02 SOB  History:        Patient has prior history of Echocardiogram examinations, most                 recent 11/23/2019. Risk Factors:Hypertension, Diabetes and                 Dyslipidemia. Opiate overdose.  Sonographer:    Roseanna Rainbow RDCS Referring Phys: [id number] [facility]  Sonographer Comments: Technically  difficult study due to poor echo windows, suboptimal parasternal window, suboptimal apical window and patient is morbidly obese. Image acquisition challenging due to patient body habitus. Patient moving and talking during exam. Images off axis due to habitus and patient complained of pain  from probe pressure. IMPRESSIONS  1. Left ventricular ejection fraction, by estimation, is 65 to 70%. The left ventricle has normal function. The left ventricle has no regional wall motion abnormalities. Left ventricular diastolic parameters are indeterminate.  2. Right ventricular systolic function is normal. The right ventricular size is mildly enlarged. There is mildly elevated pulmonary artery systolic pressure.  3. The mitral valve is normal in structure. Trivial mitral valve regurgitation. No evidence of mitral stenosis.  4. The aortic valve is tricuspid. Aortic valve regurgitation is not visualized. No aortic stenosis is present.  5. The inferior vena cava is normal in size with greater than 50% respiratory variability, suggesting right atrial pressure of 3 mmHg. FINDINGS  Left Ventricle: Left ventricular ejection fraction, by estimation, is 65 to 70%. The left ventricle has normal function. The left ventricle has no regional wall motion abnormalities. The left ventricular internal cavity size was normal in size. There is  no left ventricular hypertrophy. Left ventricular diastolic parameters are indeterminate. Right Ventricle: The right ventricular size is mildly enlarged. No increase in right ventricular wall thickness. Right ventricular systolic function is normal. There is mildly elevated pulmonary artery systolic pressure. The tricuspid regurgitant velocity is 2.98 m/s, and with an assumed right atrial pressure of 3 mmHg, the estimated right ventricular systolic pressure is 73.5 mmHg. Left Atrium: Left atrial size was normal in size. Right Atrium: Right atrial size was normal in size. Pericardium: Trivial pericardial  effusion is present. Mitral Valve: The mitral valve is normal in structure. Trivial mitral valve regurgitation. No evidence of mitral valve stenosis. Tricuspid Valve: The tricuspid valve is normal in structure. Tricuspid valve regurgitation is trivial. No evidence of tricuspid stenosis. Aortic Valve: The aortic valve is tricuspid. Aortic valve regurgitation is not visualized. No aortic stenosis is present. Pulmonic Valve: The pulmonic valve was normal in structure. Pulmonic valve regurgitation is not visualized. No evidence of pulmonic stenosis. Aorta: The aortic root is normal in size and structure. Venous: The inferior vena cava is normal in size with greater than 50% respiratory variability, suggesting right atrial pressure of 3 mmHg. IAS/Shunts: No atrial level shunt detected by color flow Doppler.  LEFT VENTRICLE PLAX 2D LVIDd:         3.90 cm     Diastology LVIDs:         2.40 cm     LV e' medial:    9.68 cm/s LV PW:         1.40 cm     LV E/e' medial:  11.4 LV IVS:        1.30 cm     LV e' lateral:   8.38 cm/s LVOT diam:     2.00 cm     LV E/e' lateral: 13.1 LV SV:         79 LV SV Index:   33 LVOT Area:     3.14 cm  LV Volumes (MOD) LV vol d, MOD A2C: 48.7 ml LV vol d, MOD A4C: 69.3 ml LV vol s, MOD A2C: 19.8 ml LV vol s, MOD A4C: 25.2 ml LV SV MOD A2C:     28.9 ml LV SV MOD A4C:     69.3 ml LV SV MOD BP:      36.9 ml RIGHT VENTRICLE             IVC RV S prime:     19.50 cm/s  IVC diam: 1.60 cm TAPSE (M-mode): 2.1 cm LEFT ATRIUM  Index       RIGHT ATRIUM           Index LA diam:        3.90 cm 1.64 cm/m  RA Area:     14.40 cm LA Vol (A2C):   41.1 ml 17.25 ml/m RA Volume:   32.90 ml  13.80 ml/m LA Vol (A4C):   61.7 ml 25.89 ml/m LA Biplane Vol: 55.0 ml 23.08 ml/m  AORTIC VALVE             PULMONIC VALVE LVOT Vmax:   140.00 cm/s PR End Diast Vel: 1.83 msec LVOT Vmean:  90.100 cm/s LVOT VTI:    0.253 m  AORTA Ao Root diam: 3.00 cm Ao Asc diam:  3.30 cm MITRAL VALVE                TRICUSPID  VALVE MV Area (PHT): 6.71 cm     TR Peak grad:   35.5 mmHg MV Decel Time: 113 msec     TR Vmax:        298.00 cm/s MV E velocity: 110.00 cm/s MV A velocity: 98.50 cm/s   SHUNTS MV E/A ratio:  1.12         Systemic VTI:  0.25 m                             Systemic Diam: 2.00 cm Skeet Latch MD Electronically signed by Skeet Latch MD Signature Date/Time: 07/26/2020/12:38:24 PM    Final     Medications: Infusions:   Scheduled Medications: . busPIRone  5 mg Oral Q8H  . DULoxetine  30 mg Oral BID  . famotidine  20 mg Oral QHS  . furosemide  40 mg Intravenous BID  . insulin aspart  0-20 Units Subcutaneous TID WC  . insulin aspart  0-5 Units Subcutaneous QHS  . senna-docusate  1 tablet Oral Daily  . sodium zirconium cyclosilicate  10 g Oral BID  . traZODone  50 mg Oral QHS    have reviewed scheduled and prn medications.  Physical Exam: General: Morbidly obese female with shortness lying on bed, not in distress Heart:RRR, s1s2 nl, no rubs Lungs: Distant breath sound. Abdomen:soft, obese Extremities: Bilateral lower extremity pitting edema present up to thigh. Neurology: Alert, awake and oriented.  Ansley Mangiapane Tanna Furry 07/27/2020,2:04 PM  LOS: 1 day

## 2020-07-27 NOTE — Progress Notes (Signed)
Pt refused CPAP qhs for the night.  Pt is having trouble with her IBS and states that she does not want to fight with the cpap all night while dealing with bathroom issues.  Pt encouraged to contact RT should she change her mind.

## 2020-07-27 NOTE — Progress Notes (Signed)
Patient ID: OANH DEVIVO, female   DOB: 1955/04/30, 65 y.o.   MRN: [id number]  PROGRESS NOTE    [name]  [name]:[id number] DOB: 03/08/1955 DOA: 07/25/2020 PCP: Pcp, No   Brief Narrative:  65 y.o. female with medical history significant of diabetes mellitus type 2, diabetic neuropathy, hypertension, chronic diastolic CHF, chronic anemia, chronic kidney disease stage III, morbid obesity, hemorrhagic shock secondary to GI bleeding requiring embolization, OSA on CPAP, anxiety and depression presented to the hospital with elevated potassium.  On presentation, potassium was 6.3 with creatinine of 1.25, hemoglobin of 7.7  with no EKG changes of hyperkalemia.  She was given a dose of Lokelma. Chest x-ray showed questionable right lower lobe infiltrate versus artifact.  She was started on Lasix and given IV sodium bicarbonate/calcium gluconate/regular insulin/dextrose/nebulized albuterol.  Repeat potassium was still high.  Nephrology was consulted.  Assessment & Plan:   Hyperkalemia -Most likely related to lisinopril use. -Patient stated that she used the morning of presentation.   Lisinopril on hold. -Potassium 6.3 on presentation.  Lokelma given in the ED.  EKG without significant changes of hyperkalemia.  She was treated with [facility] along with Lasix and given IV sodium bicarbonate/calcium gluconate/regular insulin/dextrose/nebulized albuterol. -Repeat potassium was 6.1 on 07/25/2020 and 6.2 on 07/26/2020.  She was treated with another round of sodium bicarbonate/calcium gluconate/regular insulin/dextrose/nebulized albuterol.  Currently on Lokelma 10 g twice a day.   -Potassium 5.8 this morning. -Nephrology following: IV fluids have been discontinued and patient has been started on Lasix 40 mg IV every 12 hours by nephrology.  Acute on chronic diastolic heart failure -Patient is morbidly obese as well and volume status assessment is difficult.    Echo showed EF of 65 to 70% - Lasix plan as  above.  Strict input and output.  Daily weights.  Fluid restriction of 1200 cc a day.  Acute on chronic kidney disease stage IIIa -Possibly from volume overload.  Creatinine worsening to 2.1.  Follow nephrology recommendations.  Monitor. -Renal ultrasound was negative for hydronephrosis  Acute on chronic anemia of chronic disease -Possibly from kidney disease.  Hemoglobin 6.9 on 07/26/2020.   Transfused 1 unit packed red cells.  DC'd Lovenox.  Hemoglobin 8.8 this morning.  No overt signs of bleeding.  Thrombocytosis -Possibly reactive.  Improved  Diabetes mellitus type 2 -CBGs with SSI. Carb modified diet.    Hypertension -Monitor blood pressure.    Blood pressure on the lower side.  Anxiety and depression -Continue home regimen  Morbid obesity -Outpatient follow-up  Generalized conditioning -PT eval  DVT prophylaxis:  SCDs. Code Status: Full Family Communication:  Friend and caretaker at bedside on 07/26/2020 Disposition Plan: Status is: Inpatient because: Inpatient level of care appropriate due to severity of illness  Dispo: The patient is from: Home              Anticipated d/c is to: Home              Patient currently is not medically stable to d/c.   Difficult to place patient No   Consultants: Nephrology  Procedures: Echo as below  Antimicrobials: None   Subjective: Patient seen and examined at bedside.  Denies worsening shortness of breath, abdominal pain or vomiting.  No overnight fever reported.  Objective: Vitals:   07/26/20 2015 07/26/20 2100 07/27/20 0500 07/27/20 0700  BP: (!) 97/52 (!) 118/40 (!) 91/48   Pulse: 97 99 80   Resp: 18 20 18    Temp: 97.6 F (  36.4 C) 97.7 F (36.5 C) 98.9 F (37.2 C)   TempSrc: Oral Oral Oral   SpO2: 98% 97% 93%   Weight:    (!) 150.8 kg  Height:        Intake/Output Summary (Last 24 hours) at 07/27/2020 0757 Last data filed at 07/26/2020 2257 Gross per 24 hour  Intake 1407.34 ml  Output 900 ml  Net 507.34  ml   Filed Weights   07/25/20 2114 07/26/20 0459 07/27/20 0700  Weight: (!) 155.1 kg (!) 152.4 kg (!) 150.8 kg    Examination:  General exam: On room air currently.  No distress.  Looks chronically ill. Respiratory system: Decreased breath sounds at bases bilaterally with bibasilar crackles Cardiovascular system: Rate controlled, S1-2 heard Gastrointestinal system: Abdomen is morbidly obese, slightly distended, soft and nontender.  Bowel sounds are heard. Extremities: No clubbing; bilateral lower extremity edema present Central nervous system: Awake and alert. No focal neurological deficits.  Moves extremities  skin: No obvious petechiae/rashes Psychiatry: Affect is flat  Data Reviewed: I have personally reviewed following labs and imaging studies  CBC: Recent Labs  Lab 07/25/20 1432 07/26/20 0317 07/27/20 0405  WBC 6.5 6.0 6.9  NEUTROABS 3.7  --  3.5  HGB 7.7* 6.9* 8.8*  HCT 25.1* 23.4* 27.8*  MCV 89.6 91.4 89.1  PLT 456* 379 595   Basic Metabolic Panel: Recent Labs  Lab 07/25/20 1432 07/25/20 2027 07/26/20 0317 07/26/20 1136 07/27/20 0405  NA 142  --  140  --  140  K 6.3* 6.1* 6.2* 5.9* 5.8*  CL 114*  --  110  --  108  CO2 23  --  24  --  26  GLUCOSE 110*  --  98  --  84  BUN 39*  --  44*  --  50*  CREATININE 1.25*  --  1.70*  --  2.10*  CALCIUM 9.8  --  9.5  --  9.4  MG  --   --   --   --  1.6*   GFR: Estimated Creatinine Clearance: 37.1 mL/min (A) (by C-G formula based on SCr of 2.1 mg/dL (H)). Liver Function Tests: Recent Labs  Lab 07/25/20 1432 07/26/20 0317  AST 14* 12*  ALT 11 11  ALKPHOS 71 61  BILITOT 0.6 0.1*  PROT 6.8 6.3*  ALBUMIN 3.2* 2.9*   No results for input(s): LIPASE, AMYLASE in the last 168 hours. No results for input(s): AMMONIA in the last 168 hours. Coagulation Profile: No results for input(s): INR, PROTIME in the last 168 hours. Cardiac Enzymes: No results for input(s): CKTOTAL, CKMB, CKMBINDEX, TROPONINI in the last 168  hours. BNP (last 3 results) No results for input(s): PROBNP in the last 8760 hours. HbA1C: No results for input(s): HGBA1C in the last 72 hours. CBG: Recent Labs  Lab 07/26/20 0752 07/26/20 0754 07/26/20 1131 07/26/20 1559 07/27/20 0734  GLUCAP 75 82 111* 87 79   Lipid Profile: No results for input(s): CHOL, HDL, LDLCALC, TRIG, CHOLHDL, LDLDIRECT in the last 72 hours. Thyroid Function Tests: No results for input(s): TSH, T4TOTAL, FREET4, T3FREE, THYROIDAB in the last 72 hours. Anemia Panel: No results for input(s): VITAMINB12, FOLATE, FERRITIN, TIBC, IRON, RETICCTPCT in the last 72 hours. Sepsis Labs: No results for input(s): PROCALCITON, LATICACIDVEN in the last 168 hours.  Recent Results (from the past 240 hour(s))  SARS CORONAVIRUS 2 (TAT 6-24 HRS) Nasopharyngeal Nasopharyngeal Swab     Status: None   Collection Time: 07/25/20  3:16 PM  Specimen: Nasopharyngeal Swab  Result Value Ref Range Status   SARS Coronavirus 2 NEGATIVE NEGATIVE Final    Comment: (NOTE) SARS-CoV-2 target nucleic acids are NOT DETECTED.  The SARS-CoV-2 RNA is generally detectable in upper and lower respiratory specimens during the acute phase of infection. Negative results do not preclude SARS-CoV-2 infection, do not rule out co-infections with other pathogens, and should not be used as the sole basis for treatment or other patient management decisions. Negative results must be combined with clinical observations, patient history, and epidemiological information. The expected result is Negative.  Fact Sheet for Patients: SugarRoll.be  Fact Sheet for Healthcare Providers: https://www.woods-mathews.com/  This test is not yet approved or cleared by the [place] FDA and  has been authorized for detection and/or diagnosis of SARS-CoV-2 by FDA under an Emergency Use Authorization (EUA). This EUA will remain  in effect (meaning this test can be used)  for the duration of the COVID-19 declaration under Se ction 564(b)(1) of the Act, 21 U.S.C. section 360bbb-3(b)(1), unless the authorization is terminated or revoked sooner.  Performed at [facility], [place] [address] [place], [place] [address]          Radiology Studies: US RENAL  Result Date: 07/26/2020 CLINICAL DATA:  Acute renal failure superimposed upon stage 3 chronic kidney disease EXAM: RENAL / URINARY TRACT ULTRASOUND COMPLETE COMPARISON:  06/28/2014 FINDINGS: Right Kidney: Renal measurements: 11.6 x 4.9 x 5.0 cm = volume: 148 mL. The lower pole is slightly obscured by overlying bowel gas. Echogenicity within normal limits. No mass or hydronephrosis visualized within the visualized portion. Left Kidney: Obscured by overlying bowel gas Bladder: Obscured by overlying bowel gas Other: None. IMPRESSION: Technically limited examination with nonvisualization of the left kidney and bladder. Normal appearance of the visualized right kidney. Electronically Signed   By: Fidela Salisbury MD   On: 07/26/2020 21:33   DG Chest Port 1 View  Result Date: 07/25/2020 CLINICAL DATA:  Shortness of breath, abnormal labs, diabetes mellitus, hypertension EXAM: PORTABLE CHEST 1 VIEW COMPARISON:  Portable exam 1517 hours compared to 11/05/2019 FINDINGS: Enlargement of cardiac silhouette. Pulmonary vascularity normal. Prominent superior mediastinum which may be related to lordotic positioning. Accentuated markings in the mid to lower RIGHT lung, could be related to underpenetration though infiltrate not completely excluded. Mild central peribronchial thickening, chronic. Remaining lungs clear. No pleural effusion or pneumothorax. IMPRESSION: Enlargement of cardiac silhouette. Slightly accentuated markings in the mid to lower RIGHT lung question infiltrate versus artifact related to underpenetration. Remaining lungs grossly clear. Electronically Signed   By: Lavonia Dana M.D.   On: 07/25/2020 15:30    ECHOCARDIOGRAM COMPLETE  Result Date: 07/26/2020    ECHOCARDIOGRAM REPORT   Patient Name:   [name] [name] Date of Exam: 07/26/2020 Medical Rec #:  [id number]          Height:       62.0 in Accession #:    [id number]         Weight:       336.0 lb Date of Birth:  09/29/1955          BSA:          2.383 m Patient Age:    25 years           BP:           117/45 mmHg Patient Gender: F                  HR:  91 bpm. Exam Location:  Inpatient Procedure: 2D Echo, Cardiac Doppler and Color Doppler Indications:    R06.02 SOB  History:        Patient has prior history of Echocardiogram examinations, most                 recent 11/23/2019. Risk Factors:Hypertension, Diabetes and                 Dyslipidemia. Opiate overdose.  Sonographer:    Roseanna Rainbow RDCS Referring Phys: [id number] [facility]  Sonographer Comments: Technically difficult study due to poor echo windows, suboptimal parasternal window, suboptimal apical window and patient is morbidly obese. Image acquisition challenging due to patient body habitus. Patient moving and talking during exam. Images off axis due to habitus and patient complained of pain from probe pressure. IMPRESSIONS  1. Left ventricular ejection fraction, by estimation, is 65 to 70%. The left ventricle has normal function. The left ventricle has no regional wall motion abnormalities. Left ventricular diastolic parameters are indeterminate.  2. Right ventricular systolic function is normal. The right ventricular size is mildly enlarged. There is mildly elevated pulmonary artery systolic pressure.  3. The mitral valve is normal in structure. Trivial mitral valve regurgitation. No evidence of mitral stenosis.  4. The aortic valve is tricuspid. Aortic valve regurgitation is not visualized. No aortic stenosis is present.  5. The inferior vena cava is normal in size with greater than 50% respiratory variability, suggesting right atrial pressure of 3 mmHg. FINDINGS  Left Ventricle: Left  ventricular ejection fraction, by estimation, is 65 to 70%. The left ventricle has normal function. The left ventricle has no regional wall motion abnormalities. The left ventricular internal cavity size was normal in size. There is  no left ventricular hypertrophy. Left ventricular diastolic parameters are indeterminate. Right Ventricle: The right ventricular size is mildly enlarged. No increase in right ventricular wall thickness. Right ventricular systolic function is normal. There is mildly elevated pulmonary artery systolic pressure. The tricuspid regurgitant velocity is 2.98 m/s, and with an assumed right atrial pressure of 3 mmHg, the estimated right ventricular systolic pressure is 16.0 mmHg. Left Atrium: Left atrial size was normal in size. Right Atrium: Right atrial size was normal in size. Pericardium: Trivial pericardial effusion is present. Mitral Valve: The mitral valve is normal in structure. Trivial mitral valve regurgitation. No evidence of mitral valve stenosis. Tricuspid Valve: The tricuspid valve is normal in structure. Tricuspid valve regurgitation is trivial. No evidence of tricuspid stenosis. Aortic Valve: The aortic valve is tricuspid. Aortic valve regurgitation is not visualized. No aortic stenosis is present. Pulmonic Valve: The pulmonic valve was normal in structure. Pulmonic valve regurgitation is not visualized. No evidence of pulmonic stenosis. Aorta: The aortic root is normal in size and structure. Venous: The inferior vena cava is normal in size with greater than 50% respiratory variability, suggesting right atrial pressure of 3 mmHg. IAS/Shunts: No atrial level shunt detected by color flow Doppler.  LEFT VENTRICLE PLAX 2D LVIDd:         3.90 cm     Diastology LVIDs:         2.40 cm     LV e' medial:    9.68 cm/s LV PW:         1.40 cm     LV E/e' medial:  11.4 LV IVS:        1.30 cm     LV e' lateral:   8.38 cm/s LVOT diam:  2.00 cm     LV E/e' lateral: 13.1 LV SV:         79 LV  SV Index:   33 LVOT Area:     3.14 cm  LV Volumes (MOD) LV vol d, MOD A2C: 48.7 ml LV vol d, MOD A4C: 69.3 ml LV vol s, MOD A2C: 19.8 ml LV vol s, MOD A4C: 25.2 ml LV SV MOD A2C:     28.9 ml LV SV MOD A4C:     69.3 ml LV SV MOD BP:      36.9 ml RIGHT VENTRICLE             IVC RV S prime:     19.50 cm/s  IVC diam: 1.60 cm TAPSE (M-mode): 2.1 cm LEFT ATRIUM             Index       RIGHT ATRIUM           Index LA diam:        3.90 cm 1.64 cm/m  RA Area:     14.40 cm LA Vol (A2C):   41.1 ml 17.25 ml/m RA Volume:   32.90 ml  13.80 ml/m LA Vol (A4C):   61.7 ml 25.89 ml/m LA Biplane Vol: 55.0 ml 23.08 ml/m  AORTIC VALVE             PULMONIC VALVE LVOT Vmax:   140.00 cm/s PR End Diast Vel: 1.83 msec LVOT Vmean:  90.100 cm/s LVOT VTI:    0.253 m  AORTA Ao Root diam: 3.00 cm Ao Asc diam:  3.30 cm MITRAL VALVE                TRICUSPID VALVE MV Area (PHT): 6.71 cm     TR Peak grad:   35.5 mmHg MV Decel Time: 113 msec     TR Vmax:        298.00 cm/s MV E velocity: 110.00 cm/s MV A velocity: 98.50 cm/s   SHUNTS MV E/A ratio:  1.12         Systemic VTI:  0.25 m                             Systemic Diam: 2.00 cm Skeet Latch MD Electronically signed by Skeet Latch MD Signature Date/Time: 07/26/2020/12:38:24 PM    Final         Scheduled Meds: . busPIRone  5 mg Oral Q8H  . DULoxetine  30 mg Oral BID  . famotidine  20 mg Oral QHS  . furosemide  40 mg Intravenous BID  . insulin aspart  0-20 Units Subcutaneous TID WC  . insulin aspart  0-5 Units Subcutaneous QHS  . senna-docusate  1 tablet Oral Daily  . sodium zirconium cyclosilicate  10 g Oral BID  . traZODone  50 mg Oral QHS   Continuous Infusions:         Aline August, MD [facility] 07/27/2020, 7:57 AM

## 2020-07-28 LAB — RENAL FUNCTION PANEL
Albumin: 3.4 g/dL — ABNORMAL LOW (ref 3.5–5.0)
Anion gap: 10 (ref 5–15)
BUN: 50 mg/dL — ABNORMAL HIGH (ref 8–23)
CO2: 23 mmol/L (ref 22–32)
Calcium: 9.1 mg/dL (ref 8.9–10.3)
Chloride: 104 mmol/L (ref 98–111)
Creatinine, Ser: 2.15 mg/dL — ABNORMAL HIGH (ref 0.44–1.00)
GFR, Estimated: 25 mL/min — ABNORMAL LOW (ref >60)
Glucose, Bld: 107 mg/dL — ABNORMAL HIGH (ref 70–99)
Phosphorus: 4.7 mg/dL — ABNORMAL HIGH (ref 2.5–4.6)
Potassium: 4.9 mmol/L (ref 3.5–5.1)
Sodium: 137 mmol/L (ref 135–145)

## 2020-07-28 LAB — GLUCOSE, CAPILLARY
Glucose-Capillary: 88 mg/dL (ref 70–99)
Glucose-Capillary: 84 mg/dL (ref 70–99)
Glucose-Capillary: 82 mg/dL (ref 70–99)
Glucose-Capillary: 80 mg/dL (ref 70–99)

## 2020-07-28 LAB — CBC WITH DIFFERENTIAL/PLATELET
Abs Immature Granulocytes: 0.02 10*3/uL (ref 0.00–0.07)
Basophils Absolute: 0 10*3/uL (ref 0.0–0.1)
Basophils Relative: 1 %
Eosinophils Absolute: 0.4 10*3/uL (ref 0.0–0.5)
Eosinophils Relative: 6 %
HCT: 24 % — ABNORMAL LOW (ref 36.0–46.0)
Hemoglobin: 7.4 g/dL — ABNORMAL LOW (ref 12.0–15.0)
Immature Granulocytes: 0 %
Lymphocytes Relative: 28 %
Lymphs Abs: 1.7 10*3/uL (ref 0.7–4.0)
MCH: 27.6 pg (ref 26.0–34.0)
MCHC: 30.8 g/dL (ref 30.0–36.0)
MCV: 89.6 fL (ref 80.0–100.0)
Monocytes Absolute: 0.7 10*3/uL (ref 0.1–1.0)
Monocytes Relative: 11 %
Neutro Abs: 3.4 10*3/uL (ref 1.7–7.7)
Neutrophils Relative %: 54 %
Platelets: 347 10*3/uL (ref 150–400)
RBC: 2.68 MIL/uL — ABNORMAL LOW (ref 3.87–5.11)
RDW: 17 % — ABNORMAL HIGH (ref 11.5–15.5)
WBC: 6.3 10*3/uL (ref 4.0–10.5)
nRBC: 0 % (ref 0.0–0.2)

## 2020-07-28 LAB — CREATININE, URINE, RANDOM: Creatinine, Urine: 61.69 mg/dL

## 2020-07-28 LAB — MAGNESIUM: Magnesium: 1.4 mg/dL — ABNORMAL LOW (ref 1.7–2.4)

## 2020-07-28 MED ORDER — FUROSEMIDE 10 MG/ML IJ SOLN
80.0000 mg | Freq: Two times a day (BID) | INTRAMUSCULAR | Status: DC
  Administered 2020-07-28 – 2020-07-29 (×3): 80 mg via INTRAVENOUS
  Filled 2020-07-28 (×4): qty 8

## 2020-07-28 MED ORDER — DIPHENHYDRAMINE HCL 12.5 MG/5ML PO ELIX
25.0000 mg | ORAL_SOLUTION | Freq: Four times a day (QID) | ORAL | Status: DC | PRN
  Administered 2020-07-28 – 2020-07-31 (×5): 25 mg via ORAL
  Filled 2020-07-28 (×6): qty 10

## 2020-07-28 NOTE — Progress Notes (Signed)
Progress Note    [name] [name]   LEX:[id number]  DOB: 1955/09/25  DOA: 07/25/2020     2  PCP: Pcp, No  CC: abnormal labs  Hospital Course: 65 y.o.femalewith medical history significant ofdiabetes mellitus type 2, diabetic neuropathy, hypertension, chronic diastolic CHF, chronic anemia, chronic kidney disease stage III, morbid obesity, hemorrhagic shock secondary to GI bleeding requiring embolization, OSA on CPAP, anxiety and depression presented to the hospital with elevated potassium.  On presentation, potassium was 6.3 with creatinine of 1.25, hemoglobin of 7.7 with no EKG changes of hyperkalemia. She was given a dose of Lokelma. Chest x-ray showed questionable right lower lobe infiltrate versus artifact.  She was started on Lasix and given IV sodium bicarbonate/calcium gluconate/regular insulin/dextrose/nebulized albuterol.  Repeat potassium was still high. Nephrology was consulted.  Interval History:  No events overnight. Feeling better/improved when seen this morning.   ROS: Constitutional: negative for chills and fevers, Respiratory: negative for cough, Cardiovascular: negative for chest pain and Gastrointestinal: negative for abdominal pain  Assessment & Plan: Hyperkalemia -Most likely related to lisinopril use. Lisinopril on hold. -Potassium 6.3 on presentation. Lokelma given in the ED. EKG without significant changes of hyperkalemia.  She was treated with [facility] along with Lasix and given IV sodium bicarbonate/calcium gluconate/regular insulin/dextrose/nebulized albuterol.  - responded well to treatment   Acute on chronic diastolic heart failure -Patient is morbidly obese as well and volume status assessment is difficult.   Echo showed EF of 65 to 70% - Lasix plan as above.  Strict input and output. Daily weights. Fluid restriction of 1200 cc a day.  Acute on chronic kidney disease stage IIIa -Possibly from volume overload.  Creatinine worsening to 2.1.   Follow nephrology recommendations.  Monitor. -Renal ultrasound was negative for hydronephrosis  Acute on chronic anemia of chronic disease -Possibly from kidney disease. Hemoglobin 6.9 on 07/26/2020.  Transfused 1 unit packed red cells.  DC'd Lovenox. No overt signs of bleeding.  Thrombocytosis -Possibly reactive.  Improved  Diabetes mellitus type 2 -CBGs with SSI.Carb modified diet.  Hypertension -Monitor blood pressure.   Blood pressure on the lower side.  Anxiety and depression -Continue home regimen  Morbid obesity -Outpatient follow-up  Generalized deconditioning -PT eval  Old records reviewed in assessment of this patient  Antimicrobials:   DVT prophylaxis: Place and maintain sequential compression device Start: 07/26/20 0743   Code Status:   Code Status: Full Code Family Communication:   Disposition Plan: Status is: Inpatient  Remains inpatient appropriate because:IV treatments appropriate due to intensity of illness or inability to take PO and Inpatient level of care appropriate due to severity of illness   Dispo: The patient is from: Home              Anticipated d/c is to: Home              Patient currently is not medically stable to d/c.   Difficult to place patient No Risk of unplanned readmission score: Unplanned Admission- Pilot do not use: 31.49   Objective: Blood pressure (!) 97/37, pulse 89, temperature 98.2 F (36.8 C), temperature source Oral, resp. rate 20, height 5\' 2"  (1.575 m), weight (!) 150.8 kg, SpO2 94 %.  Examination: General appearance: alert, cooperative and no distress Head: Normocephalic, without obvious abnormality, atraumatic Eyes: EOMI Lungs: distant breath sounds Heart: regular rate and rhythm and S1, S2 normal Abdomen: obese, soft, NT Extremities: obese, LE edema noted Skin: mobility and turgor normal Neurologic: Grossly normal  Consultants:  Nephrology  Procedures:     Data Reviewed: I have  personally reviewed following labs and imaging studies Results for orders placed or performed during the hospital encounter of 07/25/20 (from the past 24 hour(s))  Glucose, capillary     Status: Abnormal   Collection Time: 07/27/20  8:17 PM  Result Value Ref Range   Glucose-Capillary 107 (H) 70 - 99 mg/dL  CBC with Differential/Platelet     Status: Abnormal   Collection Time: 07/28/20  4:10 AM  Result Value Ref Range   WBC 6.3 4.0 - 10.5 K/uL   RBC 2.68 (L) 3.87 - 5.11 MIL/uL   Hemoglobin 7.4 (L) 12.0 - 15.0 g/dL   HCT 24.0 (L) 36.0 - 46.0 %   MCV 89.6 80.0 - 100.0 fL   MCH 27.6 26.0 - 34.0 pg   MCHC 30.8 30.0 - 36.0 g/dL   RDW 17.0 (H) 11.5 - 15.5 %   Platelets 347 150 - 400 K/uL   nRBC 0.0 0.0 - 0.2 %   Neutrophils Relative % 54 %   Neutro Abs 3.4 1.7 - 7.7 K/uL   Lymphocytes Relative 28 %   Lymphs Abs 1.7 0.7 - 4.0 K/uL   Monocytes Relative 11 %   Monocytes Absolute 0.7 0.1 - 1.0 K/uL   Eosinophils Relative 6 %   Eosinophils Absolute 0.4 0.0 - 0.5 K/uL   Basophils Relative 1 %   Basophils Absolute 0.0 0.0 - 0.1 K/uL   Immature Granulocytes 0 %   Abs Immature Granulocytes 0.02 0.00 - 0.07 K/uL  Magnesium     Status: Abnormal   Collection Time: 07/28/20  4:10 AM  Result Value Ref Range   Magnesium 1.4 (L) 1.7 - 2.4 mg/dL  Renal function panel     Status: Abnormal   Collection Time: 07/28/20  4:10 AM  Result Value Ref Range   Sodium 137 135 - 145 mmol/L   Potassium 4.9 3.5 - 5.1 mmol/L   Chloride 104 98 - 111 mmol/L   CO2 23 22 - 32 mmol/L   Glucose, Bld 107 (H) 70 - 99 mg/dL   BUN 50 (H) 8 - 23 mg/dL   Creatinine, Ser 2.15 (H) 0.44 - 1.00 mg/dL   Calcium 9.1 8.9 - 10.3 mg/dL   Phosphorus 4.7 (H) 2.5 - 4.6 mg/dL   Albumin 3.4 (L) 3.5 - 5.0 g/dL   GFR, Estimated 25 (L) >60 mL/min   Anion gap 10 5 - 15  Glucose, capillary     Status: None   Collection Time: 07/28/20  7:21 AM  Result Value Ref Range   Glucose-Capillary 88 70 - 99 mg/dL  Glucose, capillary      Status: None   Collection Time: 07/28/20 11:25 AM  Result Value Ref Range   Glucose-Capillary 84 70 - 99 mg/dL  Creatinine, urine, random     Status: None   Collection Time: 07/28/20 11:58 AM  Result Value Ref Range   Creatinine, Urine 61.69 mg/dL  Glucose, capillary     Status: None   Collection Time: 07/28/20  4:36 PM  Result Value Ref Range   Glucose-Capillary 82 70 - 99 mg/dL    Recent Results (from the past 240 hour(s))  SARS CORONAVIRUS 2 (TAT 6-24 HRS) Nasopharyngeal Nasopharyngeal Swab     Status: None   Collection Time: 07/25/20  3:16 PM   Specimen: Nasopharyngeal Swab  Result Value Ref Range Status   SARS Coronavirus 2 NEGATIVE NEGATIVE Final    Comment: (NOTE) SARS-CoV-2 target  nucleic acids are NOT DETECTED.  The SARS-CoV-2 RNA is generally detectable in upper and lower respiratory specimens during the acute phase of infection. Negative results do not preclude SARS-CoV-2 infection, do not rule out co-infections with other pathogens, and should not be used as the sole basis for treatment or other patient management decisions. Negative results must be combined with clinical observations, patient history, and epidemiological information. The expected result is Negative.  Fact Sheet for Patients: SugarRoll.be  Fact Sheet for Healthcare Providers: https://www.woods-mathews.com/  This test is not yet approved or cleared by the [place] FDA and  has been authorized for detection and/or diagnosis of SARS-CoV-2 by FDA under an Emergency Use Authorization (EUA). This EUA will remain  in effect (meaning this test can be used) for the duration of the COVID-19 declaration under Se ction 564(b)(1) of the Act, 21 U.S.C. section 360bbb-3(b)(1), unless the authorization is terminated or revoked sooner.  Performed at [facility], [place] [address], [place], [place] [address]      Radiology Studies: No results found. US  RENAL  Final Result    DG Chest Port 1 View  Final Result      Scheduled Meds: . busPIRone  5 mg Oral Q8H  . DULoxetine  30 mg Oral BID  . famotidine  20 mg Oral QHS  . furosemide  80 mg Intravenous BID  . insulin aspart  0-20 Units Subcutaneous TID WC  . insulin aspart  0-5 Units Subcutaneous QHS  . midodrine  10 mg Oral TID WC  . senna-docusate  1 tablet Oral Daily  . traZODone  50 mg Oral QHS   PRN Meds: acetaminophen **OR** acetaminophen, albuterol, ALPRAZolam, diphenhydrAMINE, ondansetron **OR** ondansetron (ZOFRAN) IV, oxyCODONE Continuous Infusions:   LOS: 2 days  Time spent: Greater than 50% of the 35 minute visit was spent in counseling/coordination of care for the patient as laid out in the A&P.   Dwyane Dee, MD [facility] 07/28/2020, 8:09 PM

## 2020-07-28 NOTE — Progress Notes (Addendum)
[facility] NEPHROLOGY PROGRESS NOTE  Assessment/ Plan: Pt is a 65 y.o. yo female with morbid obesity, bedbound, DM, HTN, neuropathy, chronic diastolic CHF, anemia, CKD stage III, OSA on CPAP, anxiety depression who was sent by her PCP to ER for abnormal lab result, seen as a consultation for AKI on CKD and hyperkalemia.  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  Urinalysis bland.  Kidney ultrasound limited because of morbid obesity. Started IV albumin with IV diuretics with stable creatinine level and mild increase in urine output.  She is still volume overloaded on exam therefore I will increase Lasix to 80 mg twice a day.  We will continue strict ins and outs, daily lab.   # Hyperkalemia: Due to lisinopril and AKI.  Improved with medical management. DC Lokelma. Monitor lab.  #Hypotension: Started midodrine with better blood pressure readings.  Discontinue lisinopril.  Monitor blood pressure.  #Acute on chronic diastolic CHF: Morbidly obese, hard to assess accurate volume status however, she does have extensive pitting edema in lower extremities.  Increasing diuretics as above.  # Anemia: Received a unit of blood transfusion on 6/5.  I will check iron studies.  Monitor hemoglobin.  Subjective: Seen and examined bedside.  Urine output is recorded around 800 cc in 24 hours.  No new event.  Some shortness of breath but no nausea, vomiting. Objective Vital signs in last 24 hours: Vitals:   07/27/20 2200 07/28/20 0200 07/28/20 0649 07/28/20 1243  BP: (!) 111/51 (!) 111/51 (!) 105/47 (!) 137/58  Pulse:   83 79  Resp: 17  20   Temp:   98.7 F (37.1 C)   TempSrc:   Oral   SpO2:   91%   Weight:      Height:       Weight change:   Intake/Output Summary (Last 24 hours) at 07/28/2020 1344 Last data filed at 07/28/2020 0900 Gross per 24 hour  Intake 1020 ml  Output 600 ml  Net 420  ml       Labs: Basic Metabolic Panel: Recent Labs  Lab 07/26/20 0317 07/26/20 1136 07/27/20 0405 07/28/20 0410  NA 140  --  140 137  K 6.2* 5.9* 5.8* 4.9  CL 110  --  108 104  CO2 24  --  26 23  GLUCOSE 98  --  84 107*  BUN 44*  --  50* 50*  CREATININE 1.70*  --  2.10* 2.15*  CALCIUM 9.5  --  9.4 9.1  PHOS  --   --   --  4.7*   Liver Function Tests: Recent Labs  Lab 07/25/20 1432 07/26/20 0317 07/28/20 0410  AST 14* 12*  --   ALT 11 11  --   ALKPHOS 71 61  --   BILITOT 0.6 0.1*  --   PROT 6.8 6.3*  --   ALBUMIN 3.2* 2.9* 3.4*   No results for input(s): LIPASE, AMYLASE in the last 168 hours. No results for input(s): AMMONIA in the last 168 hours. CBC: Recent Labs  Lab 07/25/20 1432 07/26/20 0317 07/27/20 0405 07/28/20 0410  WBC 6.5 6.0 6.9 6.3  NEUTROABS 3.7  --  3.5 3.4  HGB 7.7* 6.9* 8.8* 7.4*  HCT 25.1* 23.4* 27.8* 24.0*  MCV 89.6 91.4 89.1 89.6  PLT 456* 379 382 347   Cardiac Enzymes: No results for input(s): CKTOTAL, CKMB, CKMBINDEX, TROPONINI in the last 168 hours. CBG:  Recent Labs  Lab 07/27/20 1204 07/27/20 1627 07/27/20 2017 07/28/20 0721 07/28/20 1125  GLUCAP 121* 103* 107* 88 84    Iron Studies: No results for input(s): IRON, TIBC, TRANSFERRIN, FERRITIN in the last 72 hours. Studies/Results: US RENAL  Result Date: 07/26/2020 CLINICAL DATA:  Acute renal failure superimposed upon stage 3 chronic kidney disease EXAM: RENAL / URINARY TRACT ULTRASOUND COMPLETE COMPARISON:  06/28/2014 FINDINGS: Right Kidney: Renal measurements: 11.6 x 4.9 x 5.0 cm = volume: 148 mL. The lower pole is slightly obscured by overlying bowel gas. Echogenicity within normal limits. No mass or hydronephrosis visualized within the visualized portion. Left Kidney: Obscured by overlying bowel gas Bladder: Obscured by overlying bowel gas Other: None. IMPRESSION: Technically limited examination with nonvisualization of the left kidney and bladder. Normal appearance of the  visualized right kidney. Electronically Signed   By: Fidela Salisbury MD   On: 07/26/2020 21:33    Medications: Infusions:   Scheduled Medications: . busPIRone  5 mg Oral Q8H  . DULoxetine  30 mg Oral BID  . famotidine  20 mg Oral QHS  . furosemide  40 mg Intravenous BID  . insulin aspart  0-20 Units Subcutaneous TID WC  . insulin aspart  0-5 Units Subcutaneous QHS  . midodrine  10 mg Oral TID WC  . senna-docusate  1 tablet Oral Daily  . sodium zirconium cyclosilicate  10 g Oral BID  . traZODone  50 mg Oral QHS    have reviewed scheduled and prn medications.  Physical Exam: General: Morbidly obese female with shortness lying on bed, not in distress Heart:RRR, s1s2 nl, no rubs Lungs: Distant breath sound. Abdomen:soft, obese Extremities: Bilateral lower extremity pitting edema present up to thigh.  Unchanged from yesterday Neurology: Alert, awake and oriented.  Elier Zellars Prasad Melchizedek Espinola 07/28/2020,1:44 PM  LOS: 2 days

## 2020-07-28 NOTE — Progress Notes (Signed)
RT setup patients home CPAP at bedside.  RN will place on patient when ready.

## 2020-07-29 LAB — RENAL FUNCTION PANEL
Albumin: 3.4 g/dL — ABNORMAL LOW (ref 3.5–5.0)
Anion gap: 9 (ref 5–15)
BUN: 59 mg/dL — ABNORMAL HIGH (ref 8–23)
CO2: 25 mmol/L (ref 22–32)
Calcium: 9.3 mg/dL (ref 8.9–10.3)
Chloride: 106 mmol/L (ref 98–111)
Creatinine, Ser: 2.08 mg/dL — ABNORMAL HIGH (ref 0.44–1.00)
GFR, Estimated: 26 mL/min — ABNORMAL LOW (ref >60)
Glucose, Bld: 100 mg/dL — ABNORMAL HIGH (ref 70–99)
Phosphorus: 5 mg/dL — ABNORMAL HIGH (ref 2.5–4.6)
Potassium: 4.7 mmol/L (ref 3.5–5.1)
Sodium: 140 mmol/L (ref 135–145)

## 2020-07-29 LAB — CBC
HCT: 25.7 % — ABNORMAL LOW (ref 36.0–46.0)
Hemoglobin: 7.9 g/dL — ABNORMAL LOW (ref 12.0–15.0)
MCH: 28 pg (ref 26.0–34.0)
MCHC: 30.7 g/dL (ref 30.0–36.0)
MCV: 91.1 fL (ref 80.0–100.0)
Platelets: 391 10*3/uL (ref 150–400)
RBC: 2.82 MIL/uL — ABNORMAL LOW (ref 3.87–5.11)
RDW: 17.1 % — ABNORMAL HIGH (ref 11.5–15.5)
WBC: 6.1 10*3/uL (ref 4.0–10.5)
nRBC: 0.3 % — ABNORMAL HIGH (ref 0.0–0.2)

## 2020-07-29 LAB — IRON AND TIBC
Iron: 42 ug/dL (ref 28–170)
Saturation Ratios: 11 % (ref 10.4–31.8)
TIBC: 392 ug/dL (ref 250–450)
UIBC: 350 ug/dL

## 2020-07-29 LAB — GLUCOSE, CAPILLARY
Glucose-Capillary: 71 mg/dL (ref 70–99)
Glucose-Capillary: 100 mg/dL — ABNORMAL HIGH (ref 70–99)
Glucose-Capillary: 110 mg/dL — ABNORMAL HIGH (ref 70–99)
Glucose-Capillary: 97 mg/dL (ref 70–99)

## 2020-07-29 LAB — UREA NITROGEN, URINE: Urea Nitrogen, Ur: 324 mg/dL

## 2020-07-29 LAB — MAGNESIUM: Magnesium: 1.5 mg/dL — ABNORMAL LOW (ref 1.7–2.4)

## 2020-07-29 LAB — FERRITIN: Ferritin: 41 ng/mL (ref 11–307)

## 2020-07-29 MED ORDER — SODIUM CHLORIDE 0.9 % IV SOLN
1.0000 g | INTRAVENOUS | Status: AC
  Administered 2020-07-29 – 2020-07-31 (×3): 1 g via INTRAVENOUS
  Filled 2020-07-29: qty 1
  Filled 2020-07-29 (×2): qty 10

## 2020-07-29 MED ORDER — PREGABALIN 75 MG PO CAPS
75.0000 mg | ORAL_CAPSULE | Freq: Two times a day (BID) | ORAL | Status: DC
  Administered 2020-07-29 – 2020-08-06 (×17): 75 mg via ORAL
  Filled 2020-07-29 (×17): qty 1

## 2020-07-29 NOTE — Progress Notes (Signed)
[facility] NEPHROLOGY PROGRESS NOTE  Assessment/ Plan: Pt is a 65 y.o. yo female with morbid obesity, bedbound, DM, HTN, neuropathy, chronic diastolic CHF, anemia, CKD stage III, OSA on CPAP, anxiety depression who was sent by her PCP to ER for abnormal lab result, seen as a consultation for AKI on CKD and hyperkalemia.  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  Urinalysis bland.  Kidney ultrasound limited because of morbid obesity. Urine output increasing and creatinine level is stable.  Continue IV Lasix 80 mg twice a day.  Continue strict ins and outs and daily lab.  # Hyperkalemia: Due to lisinopril and AKI.  Improved with medical management. DC Lokelma. Monitor lab.  #Hypotension: Started midodrine.  Discontinue lisinopril.  Monitor blood pressure.  #Acute on chronic diastolic CHF: Morbidly obese, hard to assess accurate volume status however, she does have extensive pitting edema in lower extremities.  Diuretics as above.  # Anemia: Received a unit of blood transfusion on 6/5.  Iron saturation 11% and serum iron 42.  I will order IV iron.  Monitor hemoglobin.  Subjective: Seen and examined bedside.  Urine output around 2.7 L.  Still having some shortness of breath.  Denies nausea vomiting chest pain. Objective Vital signs in last 24 hours: Vitals:   07/28/20 1243 07/28/20 1404 07/29/20 0500 07/29/20 1205  BP: (!) 137/58 (!) 97/37 128/65 (!) 97/36  Pulse: 79 89 87 83  Resp:  20 20 20   Temp:  98.2 F (36.8 C) 98.2 F (36.8 C) 98.1 F (36.7 C)  TempSrc:  Oral Oral Oral  SpO2:  94% 96% 98%  Weight:   (!) 151.9 kg   Height:       Weight change:   Intake/Output Summary (Last 24 hours) at 07/29/2020 1239 Last data filed at 07/29/2020 0830 Gross per 24 hour  Intake 930 ml  Output 2450 ml  Net -1520 ml       Labs: Basic Metabolic Panel: Recent Labs  Lab  07/27/20 0405 07/28/20 0410 07/29/20 0426  NA 140 137 140  K 5.8* 4.9 4.7  CL 108 104 106  CO2 26 23 25   GLUCOSE 84 107* 100*  BUN 50* 50* 59*  CREATININE 2.10* 2.15* 2.08*  CALCIUM 9.4 9.1 9.3  PHOS  --  4.7* 5.0*   Liver Function Tests: Recent Labs  Lab 07/25/20 1432 07/26/20 0317 07/28/20 0410 07/29/20 0426  AST 14* 12*  --   --   ALT 11 11  --   --   ALKPHOS 71 61  --   --   BILITOT 0.6 0.1*  --   --   PROT 6.8 6.3*  --   --   ALBUMIN 3.2* 2.9* 3.4* 3.4*   No results for input(s): LIPASE, AMYLASE in the last 168 hours. No results for input(s): AMMONIA in the last 168 hours. CBC: Recent Labs  Lab 07/25/20 1432 07/26/20 0317 07/27/20 0405 07/28/20 0410 07/29/20 0426  WBC 6.5 6.0 6.9 6.3 6.1  NEUTROABS 3.7  --  3.5 3.4  --   HGB 7.7* 6.9* 8.8* 7.4* 7.9*  HCT 25.1* 23.4* 27.8* 24.0* 25.7*  MCV 89.6 91.4 89.1 89.6 91.1  PLT 456* 379 382 347 391   Cardiac Enzymes: No results for input(s): CKTOTAL, CKMB, CKMBINDEX, TROPONINI in the last 168 hours. CBG: Recent Labs  Lab 07/28/20 1125 07/28/20 1636 07/28/20 2222 07/29/20 0717 07/29/20 1201  GLUCAP 84  82 80 71 100*    Iron Studies:  Recent Labs    07/29/20 0426  IRON 42  TIBC 392  FERRITIN 41   Studies/Results: No results found.  Medications: Infusions: . cefTRIAXone (ROCEPHIN)  IV      Scheduled Medications: . busPIRone  5 mg Oral Q8H  . DULoxetine  30 mg Oral BID  . famotidine  20 mg Oral QHS  . furosemide  80 mg Intravenous BID  . insulin aspart  0-20 Units Subcutaneous TID WC  . insulin aspart  0-5 Units Subcutaneous QHS  . midodrine  10 mg Oral TID WC  . pregabalin  75 mg Oral BID  . senna-docusate  1 tablet Oral Daily  . traZODone  50 mg Oral QHS    have reviewed scheduled and prn medications.  Physical Exam: General: Morbidly obese female with short neck, lying in bed Heart:RRR, s1s2 nl, no rubs Lungs: Distant breath sound. Abdomen:soft, obese Extremities: Bilateral lower  extremity pitting edema, mildly improved. Neurology: Alert, awake and oriented.  Lakshya Mcgillicuddy Tanna Furry 07/29/2020,12:39 PM  LOS: 3 days

## 2020-07-29 NOTE — Progress Notes (Signed)
Progress Note    [name] [name]   DGU:[id number]  DOB: [birth year]  DOA: 07/25/2020     3  PCP: Pcp, No  CC: abnormal labs  Hospital Course: 66 y.o.femalewith medical history significant ofdiabetes mellitus type 2, diabetic neuropathy, hypertension, chronic diastolic CHF, chronic anemia, chronic kidney disease stage III, morbid obesity, hemorrhagic shock secondary to GI bleeding requiring embolization, OSA on CPAP, anxiety and depression presented to the hospital with elevated potassium.  On presentation, potassium was 6.3 with creatinine of 1.25, hemoglobin of 7.7 with no EKG changes of hyperkalemia. She was given a dose of Lokelma. Chest x-ray showed questionable right lower lobe infiltrate versus artifact.  She was started on Lasix and given IV sodium bicarbonate/calcium gluconate/regular insulin/dextrose/nebulized albuterol.  Repeat potassium was still high. Nephrology was consulted.  Interval History:  No events overnight. She wants to try Lyrica stating that gabapentin doesn't work for her.   ROS: Constitutional: negative for chills and fevers, Respiratory: negative for cough, Cardiovascular: negative for chest pain and Gastrointestinal: negative for abdominal pain  Assessment & Plan: Hyperkalemia -Most likely related to lisinopril use. Lisinopril on hold. -Potassium 6.3 on presentation. Lokelma given in the ED. EKG without significant changes of hyperkalemia.  She was treated with [facility] along with Lasix and given IV sodium bicarbonate/calcium gluconate/regular insulin/dextrose/nebulized albuterol.  - responded well to treatment   Acute on chronic diastolic heart failure -Patient is morbidly obese as well and volume status assessment is difficult.   Echo showed EF of 65 to 70% - Lasix plan as above.  Strict input and output. Daily weights. Fluid restriction of 1200 cc a day. - diuresing well to lasix  Acute on chronic kidney disease stage IIIa -Possibly from  volume overload.  Creatinine worsening to 2.1.  Follow nephrology recommendations.  Monitor. -Renal ultrasound was negative for hydronephrosis  Acute on chronic anemia of chronic disease -Possibly from kidney disease. Hemoglobin 6.9 on 07/26/2020.  Transfused 1 unit packed red cells.  DC'd Lovenox. No overt signs of bleeding.  Thrombocytosis -Possibly reactive.  Improved  Diabetes mellitus type 2 -CBGs with SSI.Carb modified diet.  Hypertension -Monitor blood pressure.   Blood pressure on the lower side.  Anxiety and depression -Continue home regimen  Morbid obesity -Outpatient follow-up  Generalized deconditioning -PT eval  Old records reviewed in assessment of this patient  Antimicrobials:   DVT prophylaxis: Place and maintain sequential compression device Start: 07/26/20 0743   Code Status:   Code Status: Full Code Family Communication:   Disposition Plan: Status is: Inpatient  Remains inpatient appropriate because:IV treatments appropriate due to intensity of illness or inability to take PO and Inpatient level of care appropriate due to severity of illness   Dispo: The patient is from: Home              Anticipated d/c is to: Home              Patient currently is not medically stable to d/c.   Difficult to place patient No Risk of unplanned readmission score: Unplanned Admission- Pilot do not use: 32.35   Objective: Blood pressure (!) 97/36, pulse 83, temperature 98.1 F (36.7 C), temperature source Oral, resp. rate 20, height 5\' 2"  (1.575 m), weight (!) 151.9 kg, SpO2 98 %.  Examination: General appearance: alert, cooperative and no distress Head: Normocephalic, without obvious abnormality, atraumatic Eyes: EOMI Lungs: distant breath sounds Heart: regular rate and rhythm and S1, S2 normal Abdomen: obese, soft, NT Extremities: obese, LE edema noted  Skin: mobility and turgor normal Neurologic: Grossly normal  Consultants:    Nephrology  Procedures:     Data Reviewed: I have personally reviewed following labs and imaging studies Results for orders placed or performed during the hospital encounter of 07/25/20 (from the past 24 hour(s))  Glucose, capillary     Status: None   Collection Time: 07/28/20  4:36 PM  Result Value Ref Range   Glucose-Capillary 82 70 - 99 mg/dL  Glucose, capillary     Status: None   Collection Time: 07/28/20 10:22 PM  Result Value Ref Range   Glucose-Capillary 80 70 - 99 mg/dL  Iron and TIBC     Status: None   Collection Time: 07/29/20  4:26 AM  Result Value Ref Range   Iron 42 28 - 170 ug/dL   TIBC 392 250 - 450 ug/dL   Saturation Ratios 11 10.4 - 31.8 %   UIBC 350 ug/dL  Ferritin     Status: None   Collection Time: 07/29/20  4:26 AM  Result Value Ref Range   Ferritin 41 11 - 307 ng/mL  CBC     Status: Abnormal   Collection Time: 07/29/20  4:26 AM  Result Value Ref Range   WBC 6.1 4.0 - 10.5 K/uL   RBC 2.82 (L) 3.87 - 5.11 MIL/uL   Hemoglobin 7.9 (L) 12.0 - 15.0 g/dL   HCT 25.7 (L) 36.0 - 46.0 %   MCV 91.1 80.0 - 100.0 fL   MCH 28.0 26.0 - 34.0 pg   MCHC 30.7 30.0 - 36.0 g/dL   RDW 17.1 (H) 11.5 - 15.5 %   Platelets 391 150 - 400 K/uL   nRBC 0.3 (H) 0.0 - 0.2 %  Magnesium     Status: Abnormal   Collection Time: 07/29/20  4:26 AM  Result Value Ref Range   Magnesium 1.5 (L) 1.7 - 2.4 mg/dL  Renal function panel     Status: Abnormal   Collection Time: 07/29/20  4:26 AM  Result Value Ref Range   Sodium 140 135 - 145 mmol/L   Potassium 4.7 3.5 - 5.1 mmol/L   Chloride 106 98 - 111 mmol/L   CO2 25 22 - 32 mmol/L   Glucose, Bld 100 (H) 70 - 99 mg/dL   BUN 59 (H) 8 - 23 mg/dL   Creatinine, Ser 2.08 (H) 0.44 - 1.00 mg/dL   Calcium 9.3 8.9 - 10.3 mg/dL   Phosphorus 5.0 (H) 2.5 - 4.6 mg/dL   Albumin 3.4 (L) 3.5 - 5.0 g/dL   GFR, Estimated 26 (L) >60 mL/min   Anion gap 9 5 - 15  Glucose, capillary     Status: None   Collection Time: 07/29/20  7:17 AM  Result  Value Ref Range   Glucose-Capillary 71 70 - 99 mg/dL  Glucose, capillary     Status: Abnormal   Collection Time: 07/29/20 12:01 PM  Result Value Ref Range   Glucose-Capillary 100 (H) 70 - 99 mg/dL    Recent Results (from the past 240 hour(s))  SARS CORONAVIRUS 2 (TAT 6-24 HRS) Nasopharyngeal Nasopharyngeal Swab     Status: None   Collection Time: 07/25/20  3:16 PM   Specimen: Nasopharyngeal Swab  Result Value Ref Range Status   SARS Coronavirus 2 NEGATIVE NEGATIVE Final    Comment: (NOTE) SARS-CoV-2 target nucleic acids are NOT DETECTED.  The SARS-CoV-2 RNA is generally detectable in upper and lower respiratory specimens during the acute phase of infection. Negative results do not  preclude SARS-CoV-2 infection, do not rule out co-infections with other pathogens, and should not be used as the sole basis for treatment or other patient management decisions. Negative results must be combined with clinical observations, patient history, and epidemiological information. The expected result is Negative.  Fact Sheet for Patients: SugarRoll.be  Fact Sheet for Healthcare Providers: https://www.woods-mathews.com/  This test is not yet approved or cleared by the [place] FDA and  has been authorized for detection and/or diagnosis of SARS-CoV-2 by FDA under an Emergency Use Authorization (EUA). This EUA will remain  in effect (meaning this test can be used) for the duration of the COVID-19 declaration under Se ction 564(b)(1) of the Act, 21 U.S.C. section 360bbb-3(b)(1), unless the authorization is terminated or revoked sooner.  Performed at [facility], [place] [address], [place], [place] [address]   Urine Culture     Status: Abnormal (Preliminary result)   Collection Time: 07/27/20  4:40 PM   Specimen: Urine, Random  Result Value Ref Range Status   Specimen Description   Final    URINE, RANDOM Performed at [facility] [address], [place], [place] [address]    Special Requests   Final    NONE Performed at [facility], [place] [address], [place], [place] [address]    Culture 50,000 COLONIES/mL KLEBSIELLA PNEUMONIAE (A)  Final   Report Status PENDING  Incomplete     Radiology Studies: No results found. US RENAL  Final Result    DG Chest Port 1 View  Final Result      Scheduled Meds: . busPIRone  5 mg Oral Q8H  . DULoxetine  30 mg Oral BID  . famotidine  20 mg Oral QHS  . furosemide  80 mg Intravenous BID  . insulin aspart  0-20 Units Subcutaneous TID WC  . insulin aspart  0-5 Units Subcutaneous QHS  . midodrine  10 mg Oral TID WC  . pregabalin  75 mg Oral BID  . senna-docusate  1 tablet Oral Daily  . traZODone  50 mg Oral QHS   PRN Meds: acetaminophen **OR** acetaminophen, albuterol, ALPRAZolam, diphenhydrAMINE, ondansetron **OR** ondansetron (ZOFRAN) IV, oxyCODONE Continuous Infusions: . cefTRIAXone (ROCEPHIN)  IV 1 g (07/29/20 1311)     LOS: 3 days  Time spent: Greater than 50% of the 35 minute visit was spent in counseling/coordination of care for the patient as laid out in the A&P.   Dwyane Dee, MD [facility] 07/29/2020, 4:29 PM

## 2020-07-29 NOTE — TOC Progression Note (Signed)
Transition of Care [facility]) - Progression Note    Patient Details  Name: [name] [name] MRN: [id number] Date of Birth: 1955-04-20  Transition of [facility]) CM/SW Contact  [name], RN Phone Number: 07/29/2020, 12:59 PM  Clinical Narrative:    Spoke with pt concerning SNF. [facility] and she is active with [place] [facility]. Pt plan to return home.    Expected Discharge Plan: [place] Barriers to Discharge: No Barriers Identified  Expected Discharge Plan and Services Expected Discharge Plan: [place] arrangements for the past 2 months: Hotel/Motel                                       Social Determinants of Health (SDOH) Interventions    Readmission Risk Interventions Readmission Risk Prevention Plan 02/06/2020 11/08/2019  Transportation Screening Complete Complete  Medication Review [employer]) - Referral to Pharmacy  PCP or Specialist appointment within 3-5 days of discharge Complete Complete  HRI or [place] - Complete  SW Recovery Care/Counseling Consult Complete Complete  Palliative Care Screening Not Applicable Not Applicable  Skilled Nursing Facility Complete Complete  Some recent data might be hidden

## 2020-07-29 NOTE — Progress Notes (Signed)
PT Cancellation Note  Patient Details Name: [name] [name] MRN: [id number] DOB: Jul 27, 1955   Cancelled Treatment:    Reason Eval/Treat Not Completed: Fatigue/lethargy limiting ability to participate. Attempt to arouse pt 5x, introduced self and pt answers 1-2 questions then returns to sleeping with audible snoring. Unable to maintain alertness for therapy, will continue PT efforts.   Talbot Grumbling PT, DPT 07/29/20, 3:54 PM

## 2020-07-30 DIAGNOSIS — N179 Acute kidney failure, unspecified: Secondary | ICD-10-CM

## 2020-07-30 LAB — RENAL FUNCTION PANEL
Albumin: 3.3 g/dL — ABNORMAL LOW (ref 3.5–5.0)
Anion gap: 11 (ref 5–15)
BUN: 69 mg/dL — ABNORMAL HIGH (ref 8–23)
CO2: 24 mmol/L (ref 22–32)
Calcium: 9.2 mg/dL (ref 8.9–10.3)
Chloride: 105 mmol/L (ref 98–111)
Creatinine, Ser: 2.19 mg/dL — ABNORMAL HIGH (ref 0.44–1.00)
GFR, Estimated: 24 mL/min — ABNORMAL LOW (ref >60)
Glucose, Bld: 76 mg/dL (ref 70–99)
Phosphorus: 5.7 mg/dL — ABNORMAL HIGH (ref 2.5–4.6)
Potassium: 4.8 mmol/L (ref 3.5–5.1)
Sodium: 140 mmol/L (ref 135–145)

## 2020-07-30 LAB — URINE CULTURE: Culture: 50000 — AB

## 2020-07-30 LAB — MAGNESIUM: Magnesium: 1.5 mg/dL — ABNORMAL LOW (ref 1.7–2.4)

## 2020-07-30 LAB — GLUCOSE, CAPILLARY
Glucose-Capillary: 82 mg/dL (ref 70–99)
Glucose-Capillary: 103 mg/dL — ABNORMAL HIGH (ref 70–99)
Glucose-Capillary: 98 mg/dL (ref 70–99)
Glucose-Capillary: 110 mg/dL — ABNORMAL HIGH (ref 70–99)

## 2020-07-30 MED ORDER — MIDODRINE HCL 5 MG PO TABS
15.0000 mg | ORAL_TABLET | Freq: Three times a day (TID) | ORAL | Status: DC
  Administered 2020-07-30 – 2020-08-06 (×22): 15 mg via ORAL
  Filled 2020-07-30 (×24): qty 3

## 2020-07-30 MED ORDER — FUROSEMIDE 40 MG PO TABS
40.0000 mg | ORAL_TABLET | Freq: Two times a day (BID) | ORAL | Status: DC
  Administered 2020-07-30 (×2): 40 mg via ORAL
  Filled 2020-07-30 (×2): qty 1

## 2020-07-30 MED ORDER — SODIUM CHLORIDE 0.9 % IV SOLN
250.0000 mg | Freq: Every day | INTRAVENOUS | Status: AC
  Administered 2020-07-30 – 2020-07-31 (×2): 250 mg via INTRAVENOUS
  Filled 2020-07-30 (×2): qty 20

## 2020-07-30 MED ORDER — MAGNESIUM SULFATE 4 GM/100ML IV SOLN
4.0000 g | Freq: Once | INTRAVENOUS | Status: AC
  Administered 2020-07-30: 4 g via INTRAVENOUS
  Filled 2020-07-30: qty 100

## 2020-07-30 NOTE — Progress Notes (Signed)
Physical Therapy Treatment Patient Details Name: [name] [name] MRN: [id number] DOB: 03/16/1955 Today's Date: 07/30/2020    History of Present Illness [name] [name] is a 65yo female whot presents with elevated potassium. PMH: diabetes mellitus type 2, diabetic neuropathy, HTN, chronic diastolic CHF, chronic anemia, chronic kidney disease stage III, morbid obesity, hemorrhagic shock secondary to GI bleeding requiring embolization, OSA on CPAP, anxiety and depression    PT Comments    Pt tolerates BLE strengthening exercises while supine in bed. Pt continue to reports L knee broken, but also able to perform exercises and without pain complaints so unsure? Pt requires +2 assist for repositioning in bed, able to pull on headboard once scooted up enough to reach, slightly limited by RUE mobility. Educated pt on performing BLE strengthening exercises for additional reps and sets later today. Pt wanting to stay in bed, reports transfers to lift chair recliner at home and lays head back with legs up in same position as bed. Continue to progress acute PT as able.   Follow Up Recommendations  SNF;Supervision/Assistance - 24 hour     Equipment Recommendations  None recommended by PT    Recommendations for Other Services       Precautions / Restrictions Precautions Precautions: Fall Restrictions Weight Bearing Restrictions: No (pt reports broken LLE and R shoulder)    Mobility  Bed Mobility Overal bed mobility: Needs Assistance Bed Mobility: Rolling Rolling: Max assist;+2 for physical assistance    General bed mobility comments: max A+2 to roll and scoot up in bed with pt grabbing headboard to assist in pulling self up    Transfers  General transfer comment: pt reports using hoyer at home, declines  Ambulation/Gait                 Stairs             Wheelchair Mobility    Modified Rankin (Stroke Patients Only)       Balance                  Cognition Arousal/Alertness: Awake/alert Behavior During Therapy: WFL for tasks assessed/performed Overall Cognitive Status: Within Functional Limits for tasks assessed  General Comments: Pt attentive, following commands appropriately      Exercises General Exercises - Lower Extremity Ankle Circles/Pumps: Supine;AROM;Both;20 reps Gluteal Sets: Supine;AROM;Strengthening;Both;20 reps Heel Slides: Supine;AROM;Strengthening;Both;20 reps (AROM ~25%) Hip ABduction/ADduction: Supine;AROM;Strengthening;Both;20 reps (AROM ~25%)    General Comments        Pertinent Vitals/Pain Pain Assessment: No/denies pain    Home Living                      Prior Function            PT Goals (current goals can now be found in the care plan section) Acute Rehab PT Goals Patient Stated Goal: "walk today" PT Goal Formulation: With patient Time For Goal Achievement: 08/10/20 Potential to Achieve Goals: Fair Progress towards PT goals: Progressing toward goals    Frequency    Min 2X/week      PT Plan Current plan remains appropriate    Co-evaluation              AM-PAC PT "6 Clicks" Mobility   Outcome Measure  Help needed turning from your back to your side while in a flat bed without using bedrails?: Total Help needed moving from lying on your back to sitting on the side of a flat bed without using bedrails?: Total Help  needed moving to and from a bed to a chair (including a wheelchair)?: Total Help needed standing up from a chair using your arms (e.g., wheelchair or bedside chair)?: Total Help needed to walk in hospital room?: Total Help needed climbing 3-5 steps with a railing? : Total 6 Click Score: 6    End of Session   Activity Tolerance: Patient limited by fatigue Patient left: in bed;with call bell/phone within reach;with bed alarm set Nurse Communication: Mobility status;Need for lift equipment PT Visit Diagnosis: Other abnormalities of gait and mobility  (R26.89);Muscle weakness (generalized) (M62.81);Difficulty in walking, not elsewhere classified (R26.2)     Time: 2446-9507 PT Time Calculation (min) (ACUTE ONLY): 18 min  Charges:  $Therapeutic Exercise: 8-22 mins                      Tori Askia Hazelip PT, DPT 07/30/20, 10:19 AM

## 2020-07-30 NOTE — Progress Notes (Signed)
[facility] NEPHROLOGY PROGRESS NOTE  Assessment/ Plan: Pt is a 65 y.o. yo female with morbid obesity, bedbound, DM, HTN, neuropathy, chronic diastolic CHF, anemia, CKD stage III, OSA on CPAP, anxiety depression who was sent by her PCP to ER for abnormal lab result, seen as a consultation for AKI on CKD and hyperkalemia.  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  Urinalysis bland.  Kidney ultrasound limited because of morbid obesity. Responded with IV diuretics with increasing urine output and creatinine level remains stable.  Noted BUN is trending up.  She was hypotensive this morning to 80s therefore holding IV diuretics and switch to p.o. Lasix 40 mg twice daily.  Increase midodrine to 15 mg 3 times daily for hypotension.  I think she will need higher dose of diuretics once her blood pressure improves. Continue strict ins and outs and daily lab.  # Hyperkalemia: Due to lisinopril and AKI.  Improved with medical management. DC Lokelma. Monitor lab.  #Hypotension: Blood pressure low therefore I will increase midodrine to 15 mg 3 times daily.  Off of lisinopril since admission.  Monitor blood pressure.  #Acute on chronic diastolic CHF: Morbidly obese, hard to assess accurate volume status however, she does have pitting edema in lower extremities.  Diuretics as above.  # Anemia: Received a unit of blood transfusion on 6/5.  Iron saturation 11% and serum iron 42.  I ordered IV iron.  Monitor hemoglobin.  Subjective: Seen and examined bedside.  Urine output around 2 L.  In room air.  Denies nausea vomiting chest pain shortness of breath.   Objective Vital signs in last 24 hours: Vitals:   07/29/20 1205 07/29/20 2033 07/30/20 0518 07/30/20 0907  BP: (!) 97/36 (!) 105/38 (!) 83/45 (!) 99/40  Pulse: 83 73 79 70  Resp: 20 20 20    Temp: 98.1 F (36.7 C) 97.7 F (36.5 C) 97.8 F (36.6  C)   TempSrc: Oral Oral    SpO2: 98% 97% 100%   Weight:   (!) 147.9 kg   Height:       Weight change: -4 kg  Intake/Output Summary (Last 24 hours) at 07/30/2020 1112 Last data filed at 07/30/2020 0600 Gross per 24 hour  Intake 940 ml  Output 1250 ml  Net -310 ml        Labs: Basic Metabolic Panel: Recent Labs  Lab 07/28/20 0410 07/29/20 0426 07/30/20 0428  NA 137 140 140  K 4.9 4.7 4.8  CL 104 106 105  CO2 23 25 24   GLUCOSE 107* 100* 76  BUN 50* 59* 69*  CREATININE 2.15* 2.08* 2.19*  CALCIUM 9.1 9.3 9.2  PHOS 4.7* 5.0* 5.7*    Liver Function Tests: Recent Labs  Lab 07/25/20 1432 07/26/20 0317 07/28/20 0410 07/29/20 0426 07/30/20 0428  AST 14* 12*  --   --   --   ALT 11 11  --   --   --   ALKPHOS 71 61  --   --   --   BILITOT 0.6 0.1*  --   --   --   PROT 6.8 6.3*  --   --   --   ALBUMIN 3.2* 2.9* 3.4* 3.4* 3.3*    No results for input(s): LIPASE, AMYLASE in the last 168 hours. No results for input(s): AMMONIA in the last 168 hours. CBC: Recent Labs  Lab 07/25/20 1432 07/26/20 0317 07/27/20 0405 07/28/20  0410 07/29/20 0426  WBC 6.5 6.0 6.9 6.3 6.1  NEUTROABS 3.7  --  3.5 3.4  --   HGB 7.7* 6.9* 8.8* 7.4* 7.9*  HCT 25.1* 23.4* 27.8* 24.0* 25.7*  MCV 89.6 91.4 89.1 89.6 91.1  PLT 456* 379 382 347 391    Cardiac Enzymes: No results for input(s): CKTOTAL, CKMB, CKMBINDEX, TROPONINI in the last 168 hours. CBG: Recent Labs  Lab 07/29/20 0717 07/29/20 1201 07/29/20 1711 07/29/20 2128 07/30/20 0836  GLUCAP 71 100* 110* 97 82     Iron Studies:  Recent Labs    07/29/20 0426  IRON 42  TIBC 392  FERRITIN 41    Studies/Results: No results found.  Medications: Infusions:  cefTRIAXone (ROCEPHIN)  IV 1 g (07/29/20 1311)   magnesium sulfate bolus IVPB      Scheduled Medications:  busPIRone  5 mg Oral Q8H   DULoxetine  30 mg Oral BID   famotidine  20 mg Oral QHS   furosemide  40 mg Oral BID   insulin aspart  0-20 Units  Subcutaneous TID WC   insulin aspart  0-5 Units Subcutaneous QHS   midodrine  15 mg Oral TID WC   pregabalin  75 mg Oral BID   senna-docusate  1 tablet Oral Daily   traZODone  50 mg Oral QHS    have reviewed scheduled and prn medications.  Physical Exam: General: Morbidly obese female with short neck, lying on bed comfortable Heart:RRR, s1s2 nl, no rubs Lungs: Distant breath sound. Abdomen:soft, obese Extremities: Bilateral lower extremity pitting edema, improved from before. Neurology: Alert, awake and oriented.  [name] [name] Tanna Furry 07/30/2020,11:12 AM  LOS: 4 days

## 2020-07-30 NOTE — Progress Notes (Signed)
Progress Note    [name] [name]   YNW:[id number]  DOB: 05-15-1955  DOA: 07/25/2020     4  PCP: Pcp, No  CC: abnormal labs  Hospital Course: 65 y.o. female with medical history significant of diabetes mellitus type 2, diabetic neuropathy, hypertension, chronic diastolic CHF, chronic anemia, chronic kidney disease stage III, morbid obesity, hemorrhagic shock secondary to GI bleeding requiring embolization, OSA on CPAP, anxiety and depression presented to the hospital with elevated potassium.  On presentation, potassium was 6.3 with creatinine of 1.25, hemoglobin of 7.7  with no EKG changes of hyperkalemia.  She was given a dose of Lokelma. Chest x-ray showed questionable right lower lobe infiltrate versus artifact.  She was started on Lasix and given IV sodium bicarbonate/calcium gluconate/regular insulin/dextrose/nebulized albuterol.  Repeat potassium was still high. Nephrology was consulted.  Interval History:  No events overnight.  She states the Lyrica helps and would like to continue on it.  ROS: Constitutional: negative for chills and fevers, Respiratory: negative for cough, Cardiovascular: negative for chest pain and Gastrointestinal: negative for abdominal pain  Assessment & Plan: Hyperkalemia -resolved -Most likely related to lisinopril use. Lisinopril on hold. -Potassium 6.3 on presentation.  Lokelma given in the ED.  EKG without significant changes of hyperkalemia.  She was treated with [facility] along with Lasix and given IV sodium bicarbonate/calcium gluconate/regular insulin/dextrose/nebulized albuterol.  - responded well to treatment   Acute on chronic kidney disease stage IIIa -Possibly from volume overload.   -Renal ultrasound was negative for hydronephrosis -Diuresing well on Lasix but BUN uptrending.  Changed to oral Lasix per nephrology today from IV -Continue following renal function while on Lasix   Acute on chronic diastolic heart failure -Patient is morbidly  obese as well and volume status assessment is difficult.    Echo showed EF of 65 to 70% - Lasix plan as above.  Strict input and output.  Daily weights.  Fluid restriction of 1200 cc a day. - diuresing well to lasix   Acute on chronic anemia of chronic disease -Possibly from kidney disease.  Hemoglobin 6.9 on 07/26/2020.   Transfused 1 unit packed red cells.  DC'd Lovenox. No overt signs of bleeding.   Thrombocytosis -Possibly reactive.  Improved   Diabetes mellitus type 2 -CBGs with SSI. Carb modified diet.     Hypertension -Monitor blood pressure.    Blood pressure on the lower side.   Anxiety and depression -Continue home regimen   Morbid obesity -Outpatient follow-up   Generalized deconditioning -PT eval  Old records reviewed in assessment of this patient  Antimicrobials:   DVT prophylaxis: Place and maintain sequential compression device Start: 07/26/20 0743   Code Status:   Code Status: Full Code Family Communication:   Disposition Plan: Status is: Inpatient  Remains inpatient appropriate because:IV treatments appropriate due to intensity of illness or inability to take PO and Inpatient level of care appropriate due to severity of illness   Dispo: The patient is from: Home              Anticipated d/c is to: Home              Patient currently is not medically stable to d/c.   Difficult to place patient No Risk of unplanned readmission score: Unplanned Admission- Pilot do not use: 33.02   Objective: Blood pressure (!) 93/40, pulse 72, temperature 98 F (36.7 C), temperature source Oral, resp. rate (!) 21, height 5\' 2"  (1.575 m), weight (!) 147.9 kg, SpO2  95 %.  Examination: General appearance: alert, cooperative and no distress Head: Normocephalic, without obvious abnormality, atraumatic Eyes:  EOMI Lungs:  distant breath sounds Heart: regular rate and rhythm and S1, S2 normal Abdomen:  obese, soft, NT Extremities:  obese, LE edema noted Skin: mobility  and turgor normal Neurologic: Grossly normal  Consultants:  Nephrology  Procedures:    Data Reviewed: I have personally reviewed following labs and imaging studies Results for orders placed or performed during the hospital encounter of 07/25/20 (from the past 24 hour(s))  Glucose, capillary     Status: Abnormal   Collection Time: 07/29/20  5:11 PM  Result Value Ref Range   Glucose-Capillary 110 (H) 70 - 99 mg/dL  Glucose, capillary     Status: None   Collection Time: 07/29/20  9:28 PM  Result Value Ref Range   Glucose-Capillary 97 70 - 99 mg/dL  Renal function panel     Status: Abnormal   Collection Time: 07/30/20  4:28 AM  Result Value Ref Range   Sodium 140 135 - 145 mmol/L   Potassium 4.8 3.5 - 5.1 mmol/L   Chloride 105 98 - 111 mmol/L   CO2 24 22 - 32 mmol/L   Glucose, Bld 76 70 - 99 mg/dL   BUN 69 (H) 8 - 23 mg/dL   Creatinine, Ser 2.19 (H) 0.44 - 1.00 mg/dL   Calcium 9.2 8.9 - 10.3 mg/dL   Phosphorus 5.7 (H) 2.5 - 4.6 mg/dL   Albumin 3.3 (L) 3.5 - 5.0 g/dL   GFR, Estimated 24 (L) >60 mL/min   Anion gap 11 5 - 15  Magnesium     Status: Abnormal   Collection Time: 07/30/20  4:28 AM  Result Value Ref Range   Magnesium 1.5 (L) 1.7 - 2.4 mg/dL  Glucose, capillary     Status: None   Collection Time: 07/30/20  8:36 AM  Result Value Ref Range   Glucose-Capillary 82 70 - 99 mg/dL  Glucose, capillary     Status: Abnormal   Collection Time: 07/30/20 11:55 AM  Result Value Ref Range   Glucose-Capillary 103 (H) 70 - 99 mg/dL  Glucose, capillary     Status: None   Collection Time: 07/30/20  4:26 PM  Result Value Ref Range   Glucose-Capillary 98 70 - 99 mg/dL    Recent Results (from the past 240 hour(s))  SARS CORONAVIRUS 2 (TAT 6-24 HRS) Nasopharyngeal Nasopharyngeal Swab     Status: None   Collection Time: 07/25/20  3:16 PM   Specimen: Nasopharyngeal Swab  Result Value Ref Range Status   SARS Coronavirus 2 NEGATIVE NEGATIVE Final    Comment: (NOTE) SARS-CoV-2 target  nucleic acids are NOT DETECTED.  The SARS-CoV-2 RNA is generally detectable in upper and lower respiratory specimens during the acute phase of infection. Negative results do not preclude SARS-CoV-2 infection, do not rule out co-infections with other pathogens, and should not be used as the sole basis for treatment or other patient management decisions. Negative results must be combined with clinical observations, patient history, and epidemiological information. The expected result is Negative.  Fact Sheet for Patients: SugarRoll.be  Fact Sheet for Healthcare Providers: https://www.woods-mathews.com/  This test is not yet approved or cleared by the [place] FDA and  has been authorized for detection and/or diagnosis of SARS-CoV-2 by FDA under an Emergency Use Authorization (EUA). This EUA will remain  in effect (meaning this test can be used) for the duration of the COVID-19 declaration under Se ction  564(b)(1) of the Act, 21 U.S.C. section 360bbb-3(b)(1), unless the authorization is terminated or revoked sooner.  Performed at [facility], [place] [address], [place], [place] [address]   Urine Culture     Status: Abnormal   Collection Time: 07/27/20  4:40 PM   Specimen: Urine, Random  Result Value Ref Range Status   Specimen Description   Final    URINE, RANDOM Performed at [facility] [address], [place], [place] [address]    Special Requests   Final    NONE Performed at [facility], [place] [address] [place], [place] [address]    Culture 50,000 COLONIES/mL KLEBSIELLA PNEUMONIAE (A)  Final   Report Status 07/30/2020 FINAL  Final   Organism ID, Bacteria KLEBSIELLA PNEUMONIAE (A)  Final      Susceptibility   Klebsiella pneumoniae - MIC*    AMPICILLIN >=32 RESISTANT Resistant     CEFAZOLIN <=4 SENSITIVE Sensitive     CEFEPIME <=0.12 SENSITIVE Sensitive     CEFTRIAXONE <=0.25  SENSITIVE Sensitive     CIPROFLOXACIN <=0.25 SENSITIVE Sensitive     GENTAMICIN <=1 SENSITIVE Sensitive     IMIPENEM <=0.25 SENSITIVE Sensitive     NITROFURANTOIN 64 INTERMEDIATE Intermediate     TRIMETH/SULFA <=20 SENSITIVE Sensitive     AMPICILLIN/SULBACTAM 4 SENSITIVE Sensitive     PIP/TAZO <=4 SENSITIVE Sensitive     * 50,000 COLONIES/mL KLEBSIELLA PNEUMONIAE     Radiology Studies: No results found. US RENAL  Final Result    DG Chest Port 1 View  Final Result      Scheduled Meds:  busPIRone  5 mg Oral Q8H   DULoxetine  30 mg Oral BID   famotidine  20 mg Oral QHS   furosemide  40 mg Oral BID   insulin aspart  0-20 Units Subcutaneous TID WC   insulin aspart  0-5 Units Subcutaneous QHS   midodrine  15 mg Oral TID WC   pregabalin  75 mg Oral BID   senna-docusate  1 tablet Oral Daily   traZODone  50 mg Oral QHS   PRN Meds: acetaminophen **OR** acetaminophen, albuterol, ALPRAZolam, diphenhydrAMINE, ondansetron **OR** ondansetron (ZOFRAN) IV, oxyCODONE Continuous Infusions:  cefTRIAXone (ROCEPHIN)  IV 1 g (07/30/20 1431)   ferric gluconate (FERRLECIT) IVPB 250 mg (07/30/20 1516)     LOS: 4 days  Time spent: Greater than 50% of the 35 minute visit was spent in counseling/coordination of care for the patient as laid out in the A&P.   Dwyane Dee, MD [facility] 07/30/2020, 4:55 PM

## 2020-07-30 NOTE — Plan of Care (Signed)
  Problem: Education: Goal: Knowledge of General Education information will improve Description Including pain rating scale, medication(s)/side effects and non-pharmacologic comfort measures Outcome: Progressing   Problem: Health Behavior/Discharge Planning: Goal: Ability to manage health-related needs will improve Outcome: Progressing   

## 2020-07-31 DIAGNOSIS — I5031 Acute diastolic (congestive) heart failure: Secondary | ICD-10-CM

## 2020-07-31 LAB — RENAL FUNCTION PANEL
Albumin: 3.7 g/dL (ref 3.5–5.0)
Anion gap: 13 (ref 5–15)
BUN: 78 mg/dL — ABNORMAL HIGH (ref 8–23)
CO2: 22 mmol/L (ref 22–32)
Calcium: 9.6 mg/dL (ref 8.9–10.3)
Chloride: 105 mmol/L (ref 98–111)
Creatinine, Ser: 2.41 mg/dL — ABNORMAL HIGH (ref 0.44–1.00)
GFR, Estimated: 21 mL/min — ABNORMAL LOW (ref >60)
Glucose, Bld: 89 mg/dL (ref 70–99)
Phosphorus: 5.8 mg/dL — ABNORMAL HIGH (ref 2.5–4.6)
Potassium: 5.5 mmol/L — ABNORMAL HIGH (ref 3.5–5.1)
Sodium: 140 mmol/L (ref 135–145)

## 2020-07-31 LAB — GLUCOSE, CAPILLARY
Glucose-Capillary: 68 mg/dL — ABNORMAL LOW (ref 70–99)
Glucose-Capillary: 90 mg/dL (ref 70–99)
Glucose-Capillary: 128 mg/dL — ABNORMAL HIGH (ref 70–99)
Glucose-Capillary: 131 mg/dL — ABNORMAL HIGH (ref 70–99)

## 2020-07-31 LAB — MAGNESIUM: Magnesium: 2.6 mg/dL — ABNORMAL HIGH (ref 1.7–2.4)

## 2020-07-31 MED ORDER — FUROSEMIDE 40 MG PO TABS: 40.0000 mg | ORAL_TABLET | Freq: Two times a day (BID) | ORAL | Status: DC

## 2020-07-31 MED ORDER — TRAZODONE HCL 50 MG PO TABS: 50.0000 mg | ORAL_TABLET | Freq: Every evening | ORAL | Status: DC | PRN

## 2020-07-31 MED ORDER — FUROSEMIDE 10 MG/ML IJ SOLN
60.0000 mg | Freq: Two times a day (BID) | INTRAMUSCULAR | Status: DC
  Administered 2020-07-31 – 2020-08-02 (×4): 60 mg via INTRAVENOUS
  Filled 2020-07-31 (×4): qty 6

## 2020-07-31 NOTE — Progress Notes (Signed)
Inpatient Diabetes Program Recommendations  AACE/ADA: New Consensus Statement on Inpatient Glycemic Control (2015)  Target Ranges:  Prepandial:   less than 140 mg/dL      Peak postprandial:   less than 180 mg/dL (1-2 hours)      Critically ill patients:  140 - 180 mg/dL   Lab Results  Component Value Date   GLUCAP 128 (H) 07/31/2020   HGBA1C 5.3 07/26/2020    Review of Glycemic Control Diabetes history: DM2 Outpatient Diabetes medications: Amaryl 30 mg QD, Januvia 100 mg QD Current orders for Inpatient glycemic control: Novolog 0-20 units TID with meals and 0-5 HS  HgbA1C - 5.3%  Inpatient Diabetes Program Recommendations:    Decrease Novolog to 0-6 units TID.  Follow closely.  Thank you. Lorenda Peck, RD, LDN, CDE Inpatient Diabetes Coordinator [phone]

## 2020-07-31 NOTE — Progress Notes (Signed)
Pt refused nighttime trazodone and CPAP machine.

## 2020-07-31 NOTE — Progress Notes (Signed)
Progress Note    [name] [name]   QIH:[id number]  DOB: 1955/05/28  DOA: 07/25/2020     5  PCP: Pcp, No  CC: abnormal labs  Hospital Course: 65 y.o. female with medical history significant of diabetes mellitus type 2, diabetic neuropathy, hypertension, chronic diastolic CHF, chronic anemia, chronic kidney disease stage III, morbid obesity, hemorrhagic shock secondary to GI bleeding requiring embolization, OSA on CPAP, anxiety and depression presented to the hospital with elevated potassium.  On presentation, potassium was 6.3 with creatinine of 1.25, hemoglobin of 7.7  with no EKG changes of hyperkalemia.  She was given a dose of Lokelma. Chest x-ray showed questionable right lower lobe infiltrate versus artifact.  She was started on Lasix and given IV sodium bicarbonate/calcium gluconate/regular insulin/dextrose/nebulized albuterol.  Repeat potassium was still high. Nephrology was consulted.  Interval History:  No events overnight. Little more lethargic this morning. No distress but was just laying in bed not as talkative as typically. Renal function little worse today. Held lasix this am until could be seen by nephrology.   ROS: Constitutional: negative for chills and fevers, Respiratory: negative for cough, Cardiovascular: negative for chest pain and Gastrointestinal: negative for abdominal pain  Assessment & Plan: Hyperkalemia  -Most likely related to lisinopril use. Lisinopril on hold. -Potassium 6.3 on presentation.  Lokelma given in the ED.  EKG without significant changes of hyperkalemia.  She was treated with [facility] along with Lasix and given IV sodium bicarbonate/calcium gluconate/regular insulin/dextrose/nebulized albuterol.  - will treat further as necessary; 5.5 today  Acute on chronic kidney disease stage IIIa -Possibly from volume overload.   -Renal ultrasound was negative for hydronephrosis -Diuresing fairly well on Lasix but creat/BUN uptrending -Continue following  renal function while on Lasix - changed back to IV lasix today; will continue to monitor renal response    Acute on chronic diastolic heart failure -Patient is morbidly obese as well and volume status assessment is difficult.    Echo showed EF of 65 to 70% - Lasix plan as above.  Strict input and output.  Daily weights.  Fluid restriction of 1200 cc a day.   Acute on chronic anemia of chronic disease -Possibly from kidney disease.  Hemoglobin 6.9 on 07/26/2020.   Transfused 1 unit packed red cells.  DC'd Lovenox. No overt signs of bleeding. - continue SCDs   Hypertension -Monitor blood pressure.    Blood pressure on the lower side. - midodrine added and increased per nephrology  Thrombocytosis - resolved  -Possibly reactive.   Diabetes mellitus type 2 -CBGs with SSI. Carb modified diet.      Anxiety and depression -Continue home regimen   Morbid obesity -Outpatient follow-up   Generalized deconditioning -PT eval - patient declining SNF; wishes to return home at time of discharge   Old records reviewed in assessment of this patient  Antimicrobials: Rocephin 6/8 >> 6/10  DVT prophylaxis: Place and maintain sequential compression device Start: 07/26/20 0743   Code Status:   Code Status: Full Code Family Communication:   Disposition Plan: Status is: Inpatient  Remains inpatient appropriate because:IV treatments appropriate due to intensity of illness or inability to take PO and Inpatient level of care appropriate due to severity of illness   Dispo: The patient is from: Home              Anticipated d/c is to: Home              Patient currently is not medically stable to d/c.  Difficult to place patient No Risk of unplanned readmission score: Unplanned Admission- Pilot do not use: 33.99   Objective: Blood pressure (!) 109/44, pulse 72, temperature 98.1 F (36.7 C), resp. rate 20, height 5\' 2"  (1.575 m), weight (!) 149.2 kg, SpO2 92 %.  Examination: General  appearance: alert, cooperative and no distress Head: Normocephalic, without obvious abnormality, atraumatic Eyes:  EOMI Lungs:  distant breath sounds Heart: regular rate and rhythm and S1, S2 normal Abdomen:  obese, soft, NT Extremities:  obese, LE edema noted Skin: mobility and turgor normal Neurologic: Grossly normal  Consultants:  Nephrology  Procedures:    Data Reviewed: I have personally reviewed following labs and imaging studies Results for orders placed or performed during the hospital encounter of 07/25/20 (from the past 24 hour(s))  Glucose, capillary     Status: None   Collection Time: 07/30/20  4:26 PM  Result Value Ref Range   Glucose-Capillary 98 70 - 99 mg/dL  Glucose, capillary     Status: Abnormal   Collection Time: 07/30/20  8:51 PM  Result Value Ref Range   Glucose-Capillary 110 (H) 70 - 99 mg/dL  Renal function panel     Status: Abnormal   Collection Time: 07/31/20  4:23 AM  Result Value Ref Range   Sodium 140 135 - 145 mmol/L   Potassium 5.5 (H) 3.5 - 5.1 mmol/L   Chloride 105 98 - 111 mmol/L   CO2 22 22 - 32 mmol/L   Glucose, Bld 89 70 - 99 mg/dL   BUN 78 (H) 8 - 23 mg/dL   Creatinine, Ser 2.41 (H) 0.44 - 1.00 mg/dL   Calcium 9.6 8.9 - 10.3 mg/dL   Phosphorus 5.8 (H) 2.5 - 4.6 mg/dL   Albumin 3.7 3.5 - 5.0 g/dL   GFR, Estimated 21 (L) >60 mL/min   Anion gap 13 5 - 15  Magnesium     Status: Abnormal   Collection Time: 07/31/20  4:23 AM  Result Value Ref Range   Magnesium 2.6 (H) 1.7 - 2.4 mg/dL  Glucose, capillary     Status: Abnormal   Collection Time: 07/31/20  7:20 AM  Result Value Ref Range   Glucose-Capillary 68 (L) 70 - 99 mg/dL  Glucose, capillary     Status: None   Collection Time: 07/31/20 11:52 AM  Result Value Ref Range   Glucose-Capillary 90 70 - 99 mg/dL    Recent Results (from the past 240 hour(s))  SARS CORONAVIRUS 2 (TAT 6-24 HRS) Nasopharyngeal Nasopharyngeal Swab     Status: None   Collection Time: 07/25/20  3:16 PM    Specimen: Nasopharyngeal Swab  Result Value Ref Range Status   SARS Coronavirus 2 NEGATIVE NEGATIVE Final    Comment: (NOTE) SARS-CoV-2 target nucleic acids are NOT DETECTED.  The SARS-CoV-2 RNA is generally detectable in upper and lower respiratory specimens during the acute phase of infection. Negative results do not preclude SARS-CoV-2 infection, do not rule out co-infections with other pathogens, and should not be used as the sole basis for treatment or other patient management decisions. Negative results must be combined with clinical observations, patient history, and epidemiological information. The expected result is Negative.  Fact Sheet for Patients: SugarRoll.be  Fact Sheet for Healthcare Providers: https://www.woods-mathews.com/  This test is not yet approved or cleared by the [place] FDA and  has been authorized for detection and/or diagnosis of SARS-CoV-2 by FDA under an Emergency Use Authorization (EUA). This EUA will remain  in effect (meaning this  test can be used) for the duration of the COVID-19 declaration under Se ction 564(b)(1) of the Act, 21 U.S.C. section 360bbb-3(b)(1), unless the authorization is terminated or revoked sooner.  Performed at [facility], [place] [address] [place], [place] [address]   Urine Culture     Status: Abnormal   Collection Time: 07/27/20  4:40 PM   Specimen: Urine, Random  Result Value Ref Range Status   Specimen Description   Final    URINE, RANDOM Performed at [facility] [address] [place], [place] [address]    Special Requests   Final    NONE Performed at [facility], [place] [address] [place], [place] [address]    Culture 50,000 COLONIES/mL KLEBSIELLA PNEUMONIAE (A)  Final   Report Status 07/30/2020 FINAL  Final   Organism ID, Bacteria KLEBSIELLA PNEUMONIAE (A)  Final      Susceptibility   Klebsiella pneumoniae - MIC*     AMPICILLIN >=32 RESISTANT Resistant     CEFAZOLIN <=4 SENSITIVE Sensitive     CEFEPIME <=0.12 SENSITIVE Sensitive     CEFTRIAXONE <=0.25 SENSITIVE Sensitive     CIPROFLOXACIN <=0.25 SENSITIVE Sensitive     GENTAMICIN <=1 SENSITIVE Sensitive     IMIPENEM <=0.25 SENSITIVE Sensitive     NITROFURANTOIN 64 INTERMEDIATE Intermediate     TRIMETH/SULFA <=20 SENSITIVE Sensitive     AMPICILLIN/SULBACTAM 4 SENSITIVE Sensitive     PIP/TAZO <=4 SENSITIVE Sensitive     * 50,000 COLONIES/mL KLEBSIELLA PNEUMONIAE     Radiology Studies: No results found. US RENAL  Final Result    DG Chest Port 1 View  Final Result      Scheduled Meds:  busPIRone  5 mg Oral Q8H   DULoxetine  30 mg Oral BID   famotidine  20 mg Oral QHS   furosemide  60 mg Intravenous Q12H   insulin aspart  0-20 Units Subcutaneous TID WC   insulin aspart  0-5 Units Subcutaneous QHS   midodrine  15 mg Oral TID WC   pregabalin  75 mg Oral BID   senna-docusate  1 tablet Oral Daily   PRN Meds: acetaminophen **OR** acetaminophen, albuterol, ALPRAZolam, diphenhydrAMINE, ondansetron **OR** ondansetron (ZOFRAN) IV, oxyCODONE, traZODone Continuous Infusions:  cefTRIAXone (ROCEPHIN)  IV 1 g (07/30/20 1431)     LOS: 5 days  Time spent: Greater than 50% of the 35 minute visit was spent in counseling/coordination of care for the patient as laid out in the A&P.   Dwyane Dee, MD [facility] 07/31/2020, 2:03 PM

## 2020-07-31 NOTE — Progress Notes (Addendum)
[facility] NEPHROLOGY PROGRESS NOTE  Assessment/ Plan: Pt is a 65 y.o. yo female with morbid obesity, bedbound, DM, HTN, neuropathy, chronic diastolic CHF, anemia, CKD stage III, OSA on CPAP, anxiety depression who was sent by her PCP to ER for abnormal lab result, seen as a consultation for AKI on CKD and hyperkalemia.  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  Urinalysis bland.  Kidney ultrasound limited because of morbid obesity. Initially responded with IV diuretics however it was switched to oral Lasix on 6/9 because hypotension (SBP 80s)  The BUN and creatinine noted to be elevated today probably because of hypotensive episode.  Midodrine increased to 15 mg 3 times daily with acceptable blood pressure reading.  Since blood pressure is better and still significant volume overload, I will resume IV Lasix 60 mg twice daily with close monitoring of  renal function. Continue strict ins and outs and daily lab.  # Hyperkalemia: Due to lisinopril and AKI.  Initially improved with medical management.  Potassium mildly elevated today Lasix IV should help.  #Hypotension: Continue midodrine to 15 mg 3 times daily.  Off of lisinopril since admission.  Monitor blood pressure.  #Acute on chronic diastolic CHF: Morbidly obese, hard to assess accurate volume status however, she does have pitting edema in lower extremities.  Diuretics as above.  # Anemia: Received a unit of blood transfusion on 6/5.  Iron saturation 11% and serum iron 42.  I ordered IV iron.  Monitor hemoglobin.  Subjective: Seen and examined bedside.  Urine output is around 1 L.  Blood pressures much better with midodrine.  In room air.  Noted renal labs are worsened today.  Objective Vital signs in last 24 hours: Vitals:   07/30/20 1400 07/30/20 2059 07/31/20 0448 07/31/20 0500  BP:  (!) 122/45 (!) 135/58   Pulse:  73 72    Resp: (!) 21 19    Temp:  98 F (36.7 C) 97.7 F (36.5 C)   TempSrc:  Oral Oral   SpO2:  97% 99%   Weight:    (!) 149.2 kg  Height:       Weight change: 1.3 kg  Intake/Output Summary (Last 24 hours) at 07/31/2020 1157 Last data filed at 07/31/2020 0425 Gross per 24 hour  Intake 590 ml  Output 1100 ml  Net -510 ml        Labs: Basic Metabolic Panel: Recent Labs  Lab 07/29/20 0426 07/30/20 0428 07/31/20 0423  NA 140 140 140  K 4.7 4.8 5.5*  CL 106 105 105  CO2 25 24 22   GLUCOSE 100* 76 89  BUN 59* 69* 78*  CREATININE 2.08* 2.19* 2.41*  CALCIUM 9.3 9.2 9.6  PHOS 5.0* 5.7* 5.8*    Liver Function Tests: Recent Labs  Lab 07/25/20 1432 07/26/20 0317 07/28/20 0410 07/29/20 0426 07/30/20 0428 07/31/20 0423  AST 14* 12*  --   --   --   --   ALT 11 11  --   --   --   --   ALKPHOS 71 61  --   --   --   --   BILITOT 0.6 0.1*  --   --   --   --   PROT 6.8 6.3*  --   --   --   --   ALBUMIN 3.2* 2.9*   < > 3.4* 3.3* 3.7   < > = values  in this interval not displayed.    No results for input(s): LIPASE, AMYLASE in the last 168 hours. No results for input(s): AMMONIA in the last 168 hours. CBC: Recent Labs  Lab 07/25/20 1432 07/26/20 0317 07/27/20 0405 07/28/20 0410 07/29/20 0426  WBC 6.5 6.0 6.9 6.3 6.1  NEUTROABS 3.7  --  3.5 3.4  --   HGB 7.7* 6.9* 8.8* 7.4* 7.9*  HCT 25.1* 23.4* 27.8* 24.0* 25.7*  MCV 89.6 91.4 89.1 89.6 91.1  PLT 456* 379 382 347 391    Cardiac Enzymes: No results for input(s): CKTOTAL, CKMB, CKMBINDEX, TROPONINI in the last 168 hours. CBG: Recent Labs  Lab 07/30/20 0836 07/30/20 1155 07/30/20 1626 07/30/20 2051 07/31/20 0720  GLUCAP 82 103* 98 110* 68*     Iron Studies:  Recent Labs    07/29/20 0426  IRON 42  TIBC 392  FERRITIN 41    Studies/Results: No results found.  Medications: Infusions:  cefTRIAXone (ROCEPHIN)  IV 1 g (07/30/20 1431)    Scheduled Medications:  busPIRone  5 mg Oral Q8H   DULoxetine   30 mg Oral BID   famotidine  20 mg Oral QHS   [START ON 08/01/2020] furosemide  40 mg Oral BID   insulin aspart  0-20 Units Subcutaneous TID WC   insulin aspart  0-5 Units Subcutaneous QHS   midodrine  15 mg Oral TID WC   pregabalin  75 mg Oral BID   senna-docusate  1 tablet Oral Daily    have reviewed scheduled and prn medications.  Physical Exam: General: Morbidly obese female with short neck, lying on bed comfortable Heart:RRR, s1s2 nl, no rubs Lungs: Distant breath sound. Abdomen:soft, obese Extremities: Bilateral lower extremity pitting edema, minimally improved but still present.. Neurology: Alert, awake and oriented.  [name] [name] 07/31/2020,11:57 AM  LOS: 5 days

## 2020-07-31 NOTE — Plan of Care (Signed)

## 2020-07-31 NOTE — Consult Note (Signed)
[facility] Inpatient Consult   07/31/2020  ALYHA MARINES 1955/10/26 [id number]  Patient chart has been reviewed due to high risk score for unplanned readmissions.  Patient assessed for community [facility] follow up needs.  Chart review notes no primary provider on file and patient receives care through [facility]. No [facility] CM needs.  [name], MSN, [facility] Liaison Nurse Mobile Phone [phone]  Toll free office [phone]

## 2020-07-31 NOTE — Progress Notes (Signed)
Pts home cpap unit and she said she will place herself on the machine.

## 2020-08-01 LAB — GLUCOSE, CAPILLARY
Glucose-Capillary: 88 mg/dL (ref 70–99)
Glucose-Capillary: 104 mg/dL — ABNORMAL HIGH (ref 70–99)
Glucose-Capillary: 121 mg/dL — ABNORMAL HIGH (ref 70–99)
Glucose-Capillary: 140 mg/dL — ABNORMAL HIGH (ref 70–99)

## 2020-08-01 LAB — RENAL FUNCTION PANEL
Albumin: 3.5 g/dL (ref 3.5–5.0)
Anion gap: 10 (ref 5–15)
BUN: 79 mg/dL — ABNORMAL HIGH (ref 8–23)
CO2: 27 mmol/L (ref 22–32)
Calcium: 9.5 mg/dL (ref 8.9–10.3)
Chloride: 102 mmol/L (ref 98–111)
Creatinine, Ser: 2.14 mg/dL — ABNORMAL HIGH (ref 0.44–1.00)
GFR, Estimated: 25 mL/min — ABNORMAL LOW (ref >60)
Glucose, Bld: 80 mg/dL (ref 70–99)
Phosphorus: 5.2 mg/dL — ABNORMAL HIGH (ref 2.5–4.6)
Potassium: 4.8 mmol/L (ref 3.5–5.1)
Sodium: 139 mmol/L (ref 135–145)

## 2020-08-01 LAB — MAGNESIUM: Magnesium: 2.3 mg/dL (ref 1.7–2.4)

## 2020-08-01 MED ORDER — INSULIN ASPART 100 UNIT/ML IJ SOLN
0.0000 [IU] | Freq: Three times a day (TID) | INTRAMUSCULAR | Status: DC
  Administered 2020-08-02: 1 [IU] via SUBCUTANEOUS

## 2020-08-01 NOTE — Plan of Care (Signed)
  Problem: Education: Goal: Knowledge of General Education information will improve Description: Including pain rating scale, medication(s)/side effects and non-pharmacologic comfort measures Outcome: Progressing   Problem: Health Behavior/Discharge Planning: Goal: Ability to manage health-related needs will improve Outcome: Progressing   Problem: Clinical Measurements: Goal: Ability to maintain clinical measurements within normal limits will improve Outcome: Progressing Goal: Diagnostic test results will improve Outcome: Progressing Goal: Cardiovascular complication will be avoided Outcome: Progressing   Problem: Activity: Goal: Risk for activity intolerance will decrease Outcome: Progressing   Problem: Nutrition: Goal: Adequate nutrition will be maintained Outcome: Progressing   Problem: Elimination: Goal: Will not experience complications related to bowel motility Outcome: Progressing Goal: Will not experience complications related to urinary retention Outcome: Progressing   Problem: Pain Managment: Goal: General experience of comfort will improve Outcome: Progressing   Problem: Safety: Goal: Ability to remain free from injury will improve Outcome: Progressing   Problem: Skin Integrity: Goal: Risk for impaired skin integrity will decrease Outcome: Progressing

## 2020-08-01 NOTE — Progress Notes (Signed)
Made MD aware missed dose of Midodrine, also advised to hold am dose of Lasix as Nephrology will review dose and make appropriate adjustments as necessary.  SRP, RN

## 2020-08-01 NOTE — Plan of Care (Signed)

## 2020-08-01 NOTE — Plan of Care (Signed)
  Problem: Education: Goal: Knowledge of General Education information will improve Description: Including pain rating scale, medication(s)/side effects and non-pharmacologic comfort measures Outcome: Adequate for Discharge   Problem: Health Behavior/Discharge Planning: Goal: Ability to manage health-related needs will improve Outcome: Adequate for Discharge   Problem: Clinical Measurements: Goal: Ability to maintain clinical measurements within normal limits will improve Outcome: Adequate for Discharge Goal: Will remain free from infection Outcome: Adequate for Discharge Goal: Diagnostic test results will improve Outcome: Adequate for Discharge Goal: Respiratory complications will improve Outcome: Adequate for Discharge Goal: Cardiovascular complication will be avoided Outcome: Adequate for Discharge   Problem: Activity: Goal: Risk for activity intolerance will decrease Outcome: Adequate for Discharge   Problem: Nutrition: Goal: Adequate nutrition will be maintained Outcome: Adequate for Discharge   Problem: Coping: Goal: Level of anxiety will decrease Outcome: Adequate for Discharge   Problem: Elimination: Goal: Will not experience complications related to bowel motility Outcome: Adequate for Discharge Goal: Will not experience complications related to urinary retention Outcome: Adequate for Discharge   Problem: Pain Managment: Goal: General experience of comfort will improve Outcome: Adequate for Discharge   Problem: Safety: Goal: Ability to remain free from injury will improve Outcome: Adequate for Discharge   Problem: Skin Integrity: Goal: Risk for impaired skin integrity will decrease Outcome: Adequate for Discharge   Problem: Acute Rehab PT Goals(only PT should resolve) Goal: Pt Will Go Supine/Side To Sit Outcome: Adequate for Discharge Goal: Pt Will Go Sit To Supine/Side Outcome: Adequate for Discharge Goal: Pt/caregiver will Perform Home Exercise  Program Outcome: Adequate for Discharge   Problem: Acute Rehab PT Goals(only PT should resolve) Goal: Pt Will Transfer Bed To Chair/Chair To Bed Outcome: Adequate for Discharge   Problem: Acute Rehab PT Goals(only PT should resolve) Goal: Pt will Roll Supine to Side Outcome: Adequate for Discharge

## 2020-08-01 NOTE — Progress Notes (Signed)
[facility] NEPHROLOGY PROGRESS NOTE  Assessment/ Plan: Pt is a 65 y.o. yo female with morbid obesity, bedbound, DM, HTN, neuropathy, chronic diastolic CHF, anemia, CKD stage III, OSA on CPAP, anxiety depression who was sent by her PCP to ER for abnormal lab result, seen as a consultation for AKI on CKD and hyperkalemia.  Problems:  Acute kidney injury on CKDIIIa: b/l cr 1.3-1.5, due to volume overload and frequent hypotensive episode causing reduced renal perfusion.  Urinalysis bland.  Kidney ultrasound limited because of morbid obesity. Initially responded with IV diuretics however it was switched to oral Lasix on 6/9 because hypotension (SBP 80s) . Midodrine increased to 15 mg 3 times daily and IV lasix restarted. She is down total 5 kg since admission, good UOP, creat up from 1.70 on admit to 2.0- 2.5 range here. She still has another 10kg at least in her legs. Pt is in a difficult situation. She doesn't require IV lasix as she is bedbound and has no excess lung water. Diuresing w/ IV lasix has worsened renal function up to creat 2.1- 2.4, eGFR 21- 25 ml/min. She is not a HD candidate d/t bedbound state. Continued aggressive diuresis could cause more harm than good w/ progressive renal failure. Really GOC should be addressed and all of this discussed. Will continue IV lasix another 24 hrs as long as creat remains under 2.5.  Recommend palliative consult.  Hyperkalemia: Due to lisinopril and AKI. ACEi/ ARB should be avoided indefinitely in this pt w/ cor pulmonale / R sided edema and AKI w/ CKD.  Vol overload - R sided volume overload w/o any evidence of pulm edema/ resp issues. In severely morbid obese pt this looks the most like R HF (even though not seen on echo).  Anemia: Received a unit of blood transfusion on 6/5.  Iron saturation 11% and serum iron 42.  I ordered IV iron.  Monitor hemoglobin.  [name], MD 08/01/2020, 9:15 PM      Subjective: Seen and examined bedside.   BP's wnl.   Objective Vital signs in last 24 hours: Vitals:   07/31/20 2106 08/01/20 0444 08/01/20 0500 08/01/20 1321  BP: (!) 116/53 (!) 123/49  (!) 116/37  Pulse: 72 68  74  Resp: 20 19  (!) 24  Temp: 98.9 F (37.2 C) 98.4 F (36.9 C)  97.7 F (36.5 C)  TempSrc: Oral Oral  Oral  SpO2: 90% 93%  94%  Weight:   (!) 149.4 kg   Height:       Weight change: 0.2 kg  Intake/Output Summary (Last 24 hours) at 08/01/2020 2114 Last data filed at 08/01/2020 1816 Gross per 24 hour  Intake 1320 ml  Output 2750 ml  Net -1430 ml        Labs: Basic Metabolic Panel: Recent Labs  Lab 07/30/20 0428 07/31/20 0423 08/01/20 0434  NA 140 140 139  K 4.8 5.5* 4.8  CL 105 105 102  CO2 '24 22 27  ' GLUCOSE 76 89 80  BUN 69* 78* 79*  CREATININE 2.19* 2.41* 2.14*  CALCIUM 9.2 9.6 9.5  PHOS 5.7* 5.8* 5.2*    Liver Function Tests: Recent Labs  Lab 07/26/20 0317 07/28/20 0410 07/30/20 0428 07/31/20 0423 08/01/20 0434  AST 12*  --   --   --   --   ALT 11  --   --   --   --   ALKPHOS 61  --   --   --   --  BILITOT 0.1*  --   --   --   --   PROT 6.3*  --   --   --   --   ALBUMIN 2.9*   < > 3.3* 3.7 3.5   < > = values in this interval not displayed.    No results for input(s): LIPASE, AMYLASE in the last 168 hours. No results for input(s): AMMONIA in the last 168 hours. CBC: Recent Labs  Lab 07/26/20 0317 07/27/20 0405 07/28/20 0410 07/29/20 0426  WBC 6.0 6.9 6.3 6.1  NEUTROABS  --  3.5 3.4  --   HGB 6.9* 8.8* 7.4* 7.9*  HCT 23.4* 27.8* 24.0* 25.7*  MCV 91.4 89.1 89.6 91.1  PLT 379 382 347 391    Cardiac Enzymes: No results for input(s): CKTOTAL, CKMB, CKMBINDEX, TROPONINI in the last 168 hours. CBG: Recent Labs  Lab 07/31/20 1611 07/31/20 2103 08/01/20 0809 08/01/20 1218 08/01/20 1636  GLUCAP 128* 131* 88 104* 121*     Iron Studies:  No results for input(s): IRON, TIBC, TRANSFERRIN, FERRITIN in the last 72 hours.  Studies/Results: No results  found.  Medications: Infusions:    Scheduled Medications:  busPIRone  5 mg Oral Q8H   DULoxetine  30 mg Oral BID   famotidine  20 mg Oral QHS   furosemide  60 mg Intravenous Q12H   insulin aspart  0-5 Units Subcutaneous QHS   insulin aspart  0-6 Units Subcutaneous TID WC   midodrine  15 mg Oral TID WC   pregabalin  75 mg Oral BID   senna-docusate  1 tablet Oral Daily    have reviewed scheduled and prn medications.  Physical Exam: General: Morbidly obese female with short neck, lying on bed comfortable Heart:RRR, s1s2 nl, no rubs Lungs: Distant breath sound. Abdomen:soft, obese Extremities: diffuse 2-3+ bilat LE edema from feet to thighs Neurology: Alert, awake and oriented.  Sandy Salaam Ameir Faria 08/01/2020,9:14 PM  LOS: 6 days

## 2020-08-01 NOTE — Progress Notes (Signed)
Progress Note    [name] [name]   LOV:[id number]  DOB: 08-Apr-1955  DOA: 07/25/2020     6  PCP: Pcp, No  CC: abnormal labs  Hospital Course: 65 y.o. female with medical history significant of diabetes mellitus type 2, diabetic neuropathy, hypertension, chronic diastolic CHF, chronic anemia, chronic kidney disease stage III, morbid obesity, hemorrhagic shock secondary to GI bleeding requiring embolization, OSA on CPAP, anxiety and depression presented to the hospital with elevated potassium.  On presentation, potassium was 6.3 with creatinine of 1.25, hemoglobin of 7.7  with no EKG changes of hyperkalemia.  She was given a dose of Lokelma. Chest x-ray showed questionable right lower lobe infiltrate versus artifact.  She was started on Lasix and given IV sodium bicarbonate/calcium gluconate/regular insulin/dextrose/nebulized albuterol.  Repeat potassium was still high. Nephrology was consulted.  Interval History:  More lethargic the past 2 days which seems to be correlating with BUN steadily rising.  She is also having some asterixis on exam today.  Instructed nurse to hold morning dose of Lasix until nephrology can see patient later today.  ROS: Constitutional: negative for chills and fevers, Respiratory: negative for cough, Cardiovascular: negative for chest pain and Gastrointestinal: negative for abdominal pain  Assessment & Plan: Hyperkalemia - intermittent -Most likely related to lisinopril use. Lisinopril on hold. -Potassium 6.3 on presentation.  Lokelma given in the ED.  EKG without significant changes of hyperkalemia.  She was treated with [facility] along with Lasix and given IV sodium bicarbonate/calcium gluconate/regular insulin/dextrose/nebulized albuterol.  - K back down today  Acute on chronic kidney disease stage IIIa -Possibly from volume overload.   -Renal ultrasound was negative for hydronephrosis -Diuresing fairly well on Lasix but BUN uptrending; now has some  asterixis on my exam and she's more lethargic x 2 days -Continue following renal function while on Lasix - changed back to IV lasix on 6/10 per nephrology; will continue to monitor renal response    Acute on chronic diastolic heart failure -Patient is morbidly obese as well and volume status assessment is difficult.  Echo showed EF of 65 to 70% - Lasix plan as above.  Strict input and output.  Daily weights.  Fluid restriction of 1200 cc a day.   Acute on chronic anemia of chronic disease -Possibly from kidney disease.  Hemoglobin 6.9 on 07/26/2020.   Transfused 1 unit packed red cells.  DC'd Lovenox. No overt signs of bleeding. - continue SCDs   Hx Hypertension Hypotension -Monitor blood pressure.  Blood pressure on the lower side. - midodrine added and increased per nephrology  Thrombocytosis - resolved  -Possibly reactive.   Diabetes mellitus type 2 -CBGs with SSI. Carb modified diet.      Anxiety and depression -Continue home regimen   Morbid obesity -Outpatient follow-up   Generalized deconditioning -PT eval - patient declining SNF; wishes to return home at time of discharge   Old records reviewed in assessment of this patient  Antimicrobials: Rocephin 6/8 >> 6/10  DVT prophylaxis: Place and maintain sequential compression device Start: 07/26/20 0743   Code Status:   Code Status: Full Code Family Communication:   Disposition Plan: Status is: Inpatient  Remains inpatient appropriate because:IV treatments appropriate due to intensity of illness or inability to take PO and Inpatient level of care appropriate due to severity of illness   Dispo: The patient is from: Home              Anticipated d/c is to: Home  Patient currently is not medically stable to d/c.   Difficult to place patient No Risk of unplanned readmission score: Unplanned Admission- Pilot do not use: 31.7   Objective: Blood pressure (!) 116/37, pulse 74, temperature 97.7 F (36.5  C), temperature source Oral, resp. rate (!) 24, height 5\' 2"  (1.575 m), weight (!) 149.4 kg, SpO2 94 %.  Examination: General appearance: alert, cooperative and no distress Head: Normocephalic, without obvious abnormality, atraumatic Eyes:  EOMI Lungs:  distant breath sounds Heart: regular rate and rhythm and S1, S2 normal Abdomen:  obese, soft, NT Extremities:  obese, LE edema noted Skin: mobility and turgor normal Neurologic: Grossly normal  Consultants:  Nephrology  Procedures:    Data Reviewed: I have personally reviewed following labs and imaging studies Results for orders placed or performed during the hospital encounter of 07/25/20 (from the past 24 hour(s))  Glucose, capillary     Status: Abnormal   Collection Time: 07/31/20  4:11 PM  Result Value Ref Range   Glucose-Capillary 128 (H) 70 - 99 mg/dL  Glucose, capillary     Status: Abnormal   Collection Time: 07/31/20  9:03 PM  Result Value Ref Range   Glucose-Capillary 131 (H) 70 - 99 mg/dL  Magnesium     Status: None   Collection Time: 08/01/20  4:34 AM  Result Value Ref Range   Magnesium 2.3 1.7 - 2.4 mg/dL  Renal function panel     Status: Abnormal   Collection Time: 08/01/20  4:34 AM  Result Value Ref Range   Sodium 139 135 - 145 mmol/L   Potassium 4.8 3.5 - 5.1 mmol/L   Chloride 102 98 - 111 mmol/L   CO2 27 22 - 32 mmol/L   Glucose, Bld 80 70 - 99 mg/dL   BUN 79 (H) 8 - 23 mg/dL   Creatinine, Ser 2.14 (H) 0.44 - 1.00 mg/dL   Calcium 9.5 8.9 - 10.3 mg/dL   Phosphorus 5.2 (H) 2.5 - 4.6 mg/dL   Albumin 3.5 3.5 - 5.0 g/dL   GFR, Estimated 25 (L) >60 mL/min   Anion gap 10 5 - 15  Glucose, capillary     Status: None   Collection Time: 08/01/20  8:09 AM  Result Value Ref Range   Glucose-Capillary 88 70 - 99 mg/dL  Glucose, capillary     Status: Abnormal   Collection Time: 08/01/20 12:18 PM  Result Value Ref Range   Glucose-Capillary 104 (H) 70 - 99 mg/dL    Recent Results (from the past 240 hour(s))   SARS CORONAVIRUS 2 (TAT 6-24 HRS) Nasopharyngeal Nasopharyngeal Swab     Status: None   Collection Time: 07/25/20  3:16 PM   Specimen: Nasopharyngeal Swab  Result Value Ref Range Status   SARS Coronavirus 2 NEGATIVE NEGATIVE Final    Comment: (NOTE) SARS-CoV-2 target nucleic acids are NOT DETECTED.  The SARS-CoV-2 RNA is generally detectable in upper and lower respiratory specimens during the acute phase of infection. Negative results do not preclude SARS-CoV-2 infection, do not rule out co-infections with other pathogens, and should not be used as the sole basis for treatment or other patient management decisions. Negative results must be combined with clinical observations, patient history, and epidemiological information. The expected result is Negative.  Fact Sheet for Patients: SugarRoll.be  Fact Sheet for Healthcare Providers: https://www.woods-mathews.com/  This test is not yet approved or cleared by the [place] FDA and  has been authorized for detection and/or diagnosis of SARS-CoV-2 by FDA under an  Emergency Use Authorization (EUA). This EUA will remain  in effect (meaning this test can be used) for the duration of the COVID-19 declaration under Se ction 564(b)(1) of the Act, 21 U.S.C. section 360bbb-3(b)(1), unless the authorization is terminated or revoked sooner.  Performed at [facility], [place] [address], [place], [place] [address]   Urine Culture     Status: Abnormal   Collection Time: 07/27/20  4:40 PM   Specimen: Urine, Random  Result Value Ref Range Status   Specimen Description   Final    URINE, RANDOM Performed at [facility] [address] [place], [place] [address]    Special Requests   Final    NONE Performed at [facility], [place] [address], [place], [place] [address]    Culture 50,000 COLONIES/mL KLEBSIELLA PNEUMONIAE (A)  Final   Report Status  07/30/2020 FINAL  Final   Organism ID, Bacteria KLEBSIELLA PNEUMONIAE (A)  Final      Susceptibility   Klebsiella pneumoniae - MIC*    AMPICILLIN >=32 RESISTANT Resistant     CEFAZOLIN <=4 SENSITIVE Sensitive     CEFEPIME <=0.12 SENSITIVE Sensitive     CEFTRIAXONE <=0.25 SENSITIVE Sensitive     CIPROFLOXACIN <=0.25 SENSITIVE Sensitive     GENTAMICIN <=1 SENSITIVE Sensitive     IMIPENEM <=0.25 SENSITIVE Sensitive     NITROFURANTOIN 64 INTERMEDIATE Intermediate     TRIMETH/SULFA <=20 SENSITIVE Sensitive     AMPICILLIN/SULBACTAM 4 SENSITIVE Sensitive     PIP/TAZO <=4 SENSITIVE Sensitive     * 50,000 COLONIES/mL KLEBSIELLA PNEUMONIAE     Radiology Studies: No results found. US RENAL  Final Result    DG Chest Port 1 View  Final Result      Scheduled Meds:  busPIRone  5 mg Oral Q8H   DULoxetine  30 mg Oral BID   famotidine  20 mg Oral QHS   furosemide  60 mg Intravenous Q12H   insulin aspart  0-5 Units Subcutaneous QHS   insulin aspart  0-6 Units Subcutaneous TID WC   midodrine  15 mg Oral TID WC   pregabalin  75 mg Oral BID   senna-docusate  1 tablet Oral Daily   PRN Meds: acetaminophen **OR** acetaminophen, albuterol, ALPRAZolam, diphenhydrAMINE, ondansetron **OR** ondansetron (ZOFRAN) IV, oxyCODONE, traZODone Continuous Infusions:     LOS: 6 days  Time spent: Greater than 50% of the 35 minute visit was spent in counseling/coordination of care for the patient as laid out in the A&P.   Dwyane Dee, MD [facility] 08/01/2020, 2:18 PM

## 2020-08-02 DIAGNOSIS — N3000 Acute cystitis without hematuria: Secondary | ICD-10-CM

## 2020-08-02 LAB — RENAL FUNCTION PANEL
Albumin: 4 g/dL (ref 3.5–5.0)
Anion gap: 10 (ref 5–15)
BUN: 79 mg/dL — ABNORMAL HIGH (ref 8–23)
CO2: 27 mmol/L (ref 22–32)
Calcium: 10 mg/dL (ref 8.9–10.3)
Chloride: 103 mmol/L (ref 98–111)
Creatinine, Ser: 2.12 mg/dL — ABNORMAL HIGH (ref 0.44–1.00)
GFR, Estimated: 25 mL/min — ABNORMAL LOW (ref >60)
Glucose, Bld: 84 mg/dL (ref 70–99)
Phosphorus: 5.1 mg/dL — ABNORMAL HIGH (ref 2.5–4.6)
Potassium: 5 mmol/L (ref 3.5–5.1)
Sodium: 140 mmol/L (ref 135–145)

## 2020-08-02 LAB — GLUCOSE, CAPILLARY
Glucose-Capillary: 91 mg/dL (ref 70–99)
Glucose-Capillary: 116 mg/dL — ABNORMAL HIGH (ref 70–99)
Glucose-Capillary: 133 mg/dL — ABNORMAL HIGH (ref 70–99)
Glucose-Capillary: 96 mg/dL (ref 70–99)

## 2020-08-02 LAB — CBC WITH DIFFERENTIAL/PLATELET
Abs Immature Granulocytes: 0.04 10*3/uL (ref 0.00–0.07)
Basophils Absolute: 0 10*3/uL (ref 0.0–0.1)
Basophils Relative: 1 %
Eosinophils Absolute: 0.5 10*3/uL (ref 0.0–0.5)
Eosinophils Relative: 7 %
HCT: 33 % — ABNORMAL LOW (ref 36.0–46.0)
Hemoglobin: 10 g/dL — ABNORMAL LOW (ref 12.0–15.0)
Immature Granulocytes: 1 %
Lymphocytes Relative: 26 %
Lymphs Abs: 1.9 10*3/uL (ref 0.7–4.0)
MCH: 27.7 pg (ref 26.0–34.0)
MCHC: 30.3 g/dL (ref 30.0–36.0)
MCV: 91.4 fL (ref 80.0–100.0)
Monocytes Absolute: 0.8 10*3/uL (ref 0.1–1.0)
Monocytes Relative: 11 %
Neutro Abs: 4 10*3/uL (ref 1.7–7.7)
Neutrophils Relative %: 54 %
Platelets: 509 10*3/uL — ABNORMAL HIGH (ref 150–400)
RBC: 3.61 MIL/uL — ABNORMAL LOW (ref 3.87–5.11)
RDW: 17 % — ABNORMAL HIGH (ref 11.5–15.5)
WBC: 7.3 10*3/uL (ref 4.0–10.5)
nRBC: 0 % (ref 0.0–0.2)

## 2020-08-02 LAB — MAGNESIUM: Magnesium: 2.3 mg/dL (ref 1.7–2.4)

## 2020-08-02 MED ORDER — FUROSEMIDE 10 MG/ML IJ SOLN
80.0000 mg | Freq: Two times a day (BID) | INTRAMUSCULAR | Status: AC
  Administered 2020-08-02: 80 mg via INTRAVENOUS
  Filled 2020-08-02: qty 8

## 2020-08-02 NOTE — Progress Notes (Signed)
[name] [name] NEPHROLOGY PROGRESS NOTE  Assessment/ Plan: Pt is a 65 y.o. yo female with morbid obesity, bedbound, DM, HTN, neuropathy, chronic diastolic CHF, anemia, CKD stage III, OSA on CPAP, anxiety depression who was sent by her PCP to ER for abnormal lab result, seen as a consultation for AKI on CKD and hyperkalemia.  Problems:  Acute kidney injury on CKDIIIa: b/l cr 1.3-1.5, due to volume overload and frequent hypotensive episode causing reduced renal perfusion.  Urinalysis bland.  Kidney ultrasound limited because of morbid obesity. Initially responded with IV diuretics however it was switched to oral Lasix on 6/9 because hypotension (SBP 80s) . Midodrine increased to 15 mg 3 times daily. Diuresing w/ IV lasix w/ ^'d creat 2.1- 2.4, eGFR 21- 25 ml/min. She is not a HD candidate d/t bedbound state. See below. Will continue diuresing for several more days until edema is better or creat goes > 2.5.  Hyperkalemia: Due to lisinopril and AKI. ACEi/ ARB's should be avoided indefinitely in this pt w/ suspected cor pulmonale / R sided edema and AKI w/ CKD.  Vol overload - R sided volume overload w/o any evidence of pulm edema/ resp issues. In severely morbid obese pt this looks the most like R HF (even though not seen on echo).  Anemia: Received a unit of blood transfusion on 6/5.  Iron saturation 11% and serum iron 42.  I ordered IV iron.  Monitor hemoglobin.  [name], MD 08/02/2020, 7:12 PM      Subjective:  Had discussion w/ patient today. She is down 10-12 lbs and has another 10- 20 lbs of edema. Asked her if she'd want to stay in hospital to try and get this fluid off and she said yes.  Told her that we'd only use IV lasix if renal function remains stable w/ eGFR > 20 ml/min. When she is dc'd she should find a nephrologist at the [facility] where her PCP is, so they can manage higher dosing of lasix at home if needed.  Told her she is not a candidate for dialysis (due to  bedbound state) and she said she would never take dialysis anyways. Spoke w/ the son who confirmed all this (he is a dialysis patient). Did not discuss DNR / code status but perhaps this could be approached later this week.     Objective Vital signs in last 24 hours: Vitals:   08/02/20 0900 08/02/20 1000 08/02/20 1100 08/02/20 1504  BP:    (!) 110/35  Pulse:    79  Resp: (!) 23 (!) 23 (!) 23 (!) 22  Temp:    97.9 F (36.6 C)  TempSrc:    Oral  SpO2:    91%  Weight:      Height:       Weight change: -1 kg  Intake/Output Summary (Last 24 hours) at 08/02/2020 1912 Last data filed at 08/02/2020 1834 Gross per 24 hour  Intake 1080 ml  Output 2100 ml  Net -1020 ml        Labs: Basic Metabolic Panel: Recent Labs  Lab 07/31/20 0423 08/01/20 0434 08/02/20 0441  NA 140 139 140  K 5.5* 4.8 5.0  CL 105 102 103  CO2 _0 GLUCOSE 89 80 84  BUN 78* 79* 79*  CREATININE 2.41* 2.14* 2.12*  CALCIUM 9.6 9.5 10.0  PHOS 5.8* 5.2* 5.1*    Liver Function Tests: Recent Labs  Lab 07/31/20 0423 08/01/20 0434 08/02/20 0441  ALBUMIN 3.7 3.5  4.0    No results for input(s): LIPASE, AMYLASE in the last 168 hours. No results for input(s): AMMONIA in the last 168 hours. CBC: Recent Labs  Lab 07/27/20 0405 07/28/20 0410 07/29/20 0426 08/02/20 0441  WBC 6.9 6.3 6.1 7.3  NEUTROABS 3.5 3.4  --  4.0  HGB 8.8* 7.4* 7.9* 10.0*  HCT 27.8* 24.0* 25.7* 33.0*  MCV 89.1 89.6 91.1 91.4  PLT 382 347 391 509*    Cardiac Enzymes: No results for input(s): CKTOTAL, CKMB, CKMBINDEX, TROPONINI in the last 168 hours. CBG: Recent Labs  Lab 08/01/20 1636 08/01/20 2118 08/02/20 0739 08/02/20 1134 08/02/20 1635  GLUCAP 121* 140* 91 116* 133*     Iron Studies:  No results for input(s): IRON, TIBC, TRANSFERRIN, FERRITIN in the last 72 hours.  Studies/Results: No results found.  Medications: Infusions:    Scheduled Medications:  busPIRone  5 mg Oral Q8H   DULoxetine  30 mg  Oral BID   famotidine  20 mg Oral QHS   furosemide  60 mg Intravenous Q12H   insulin aspart  0-5 Units Subcutaneous QHS   insulin aspart  0-6 Units Subcutaneous TID WC   midodrine  15 mg Oral TID WC   pregabalin  75 mg Oral BID   senna-docusate  1 tablet Oral Daily    have reviewed scheduled and prn medications.  Physical Exam: General: Morbidly obese female with short neck, lying on bed comfortable Heart:RRR, s1s2 nl, no rubs Lungs: Distant breath sound. Abdomen:soft, obese Extremities: diffuse 2-3+ bilat LE edema from feet to thighs Neurology: Alert, awake and oriented.   D  08/02/2020,7:12 PM  LOS: 7 days   

## 2020-08-02 NOTE — Progress Notes (Signed)
Progress Note    [name] [name]   YFV:[id number]  DOB: 1955/07/03  DOA: 07/25/2020     7  PCP: Pcp, No  CC: abnormal labs  Hospital Course: 65 y.o. female with medical history significant of diabetes mellitus type 2, diabetic neuropathy, hypertension, chronic diastolic CHF, chronic anemia, chronic kidney disease stage III, morbid obesity, hemorrhagic shock secondary to GI bleeding requiring embolization, OSA on CPAP, anxiety and depression presented to the hospital with elevated potassium.  On presentation, potassium was 6.3 with creatinine of 1.25, hemoglobin of 7.7  with no EKG changes of hyperkalemia.  She was given a dose of Lokelma. Chest x-ray showed questionable right lower lobe infiltrate versus artifact.  She was started on Lasix and given IV sodium bicarbonate/calcium gluconate/regular insulin/dextrose/nebulized albuterol.  Repeat potassium was still high. Nephrology was consulted.  Interval History:  Patient was a little more awake today but still appears overall more lethargic than she has been. Discussed that palliative care is recommended to talk with her further about long-term goals especially given dialysis is not an option per nephrology.  ROS: Constitutional: negative for chills and fevers, Respiratory: negative for cough, Cardiovascular: negative for chest pain and Gastrointestinal: negative for abdominal pain  Assessment & Plan: Hyperkalemia - intermittent -Most likely related to lisinopril use. Lisinopril on hold. -Potassium 6.3 on presentation.  Lokelma given in the ED.  EKG without significant changes of hyperkalemia.  She was treated with [facility] along with Lasix and given IV sodium bicarbonate/calcium gluconate/regular insulin/dextrose/nebulized albuterol.  - K back down today  Acute on chronic kidney disease stage IIIa -Possibly from volume overload.   -Renal ultrasound was negative for hydronephrosis -Diuresing fairly well on Lasix but BUN and creat have  been uptrending and she had been developing more fatigue and asterixis noted on 6/11.  Per nephrology, Lasix to finish end of 08/02/2020 -Palliative care consulted for [place] discussions per nephrology recommendation.  Dialysis not offered due to her bedbound state and poor tolerability - BUN 79 and creat 2.12 today   Acute on chronic diastolic heart failure -Patient is morbidly obese as well and volume status assessment is difficult.  Echo showed EF of 65 to 70% - Lasix plan as above.  Strict input and output.  Daily weights.  Fluid restriction of 1200 cc a day.   Acute on chronic anemia of chronic disease -Possibly from kidney disease.  Hemoglobin 6.9 on 07/26/2020.   Transfused 1 unit packed red cells.  DC'd Lovenox. No overt signs of bleeding. - continue SCDs   Hx Hypertension Hypotension -Monitor blood pressure.  Blood pressure on the lower side. - midodrine added and increased per nephrology  Thrombocytosis - resolved  -Possibly reactive.   Diabetes mellitus type 2 -CBGs with SSI. Carb modified diet.      Anxiety and depression -Continue home regimen   Morbid obesity -Outpatient follow-up   Generalized deconditioning -PT eval - patient declining SNF; wishes to return home at time of discharge  - says she has an aide present from 9 am-12 pm and 5pm-7:30 pm  Old records reviewed in assessment of this patient  Antimicrobials: Rocephin 6/8 >> 6/10  DVT prophylaxis: Place and maintain sequential compression device Start: 07/26/20 0743   Code Status:   Code Status: Full Code Family Communication:   Disposition Plan: Status is: Inpatient  Remains inpatient appropriate because:IV treatments appropriate due to intensity of illness or inability to take PO and Inpatient level of care appropriate due to severity of illness  Dispo: The patient is from: Home              Anticipated d/c is to: Home              Patient currently is not medically stable to d/c.   Difficult to  place patient No Risk of unplanned readmission score: Unplanned Admission- Pilot do not use: 34.5   Objective: Blood pressure (!) 110/35, pulse 79, temperature 97.9 F (36.6 C), temperature source Oral, resp. rate (!) 22, height 5\' 2"  (1.575 m), weight (!) 148.4 kg, SpO2 91 %.  Examination: General appearance: alert, cooperative and no distress Head: Normocephalic, without obvious abnormality, atraumatic Eyes:  EOMI Lungs:  distant breath sounds Heart: regular rate and rhythm and S1, S2 normal Abdomen:  obese, soft, NT Extremities:  obese, LE edema noted Skin: mobility and turgor normal Neurologic: Grossly normal  Consultants:  Nephrology  Procedures:    Data Reviewed: I have personally reviewed following labs and imaging studies Results for orders placed or performed during the hospital encounter of 07/25/20 (from the past 24 hour(s))  Glucose, capillary     Status: Abnormal   Collection Time: 08/01/20  4:36 PM  Result Value Ref Range   Glucose-Capillary 121 (H) 70 - 99 mg/dL  Glucose, capillary     Status: Abnormal   Collection Time: 08/01/20  9:18 PM  Result Value Ref Range   Glucose-Capillary 140 (H) 70 - 99 mg/dL  Magnesium     Status: None   Collection Time: 08/02/20  4:41 AM  Result Value Ref Range   Magnesium 2.3 1.7 - 2.4 mg/dL  Renal function panel     Status: Abnormal   Collection Time: 08/02/20  4:41 AM  Result Value Ref Range   Sodium 140 135 - 145 mmol/L   Potassium 5.0 3.5 - 5.1 mmol/L   Chloride 103 98 - 111 mmol/L   CO2 27 22 - 32 mmol/L   Glucose, Bld 84 70 - 99 mg/dL   BUN 79 (H) 8 - 23 mg/dL   Creatinine, Ser 2.12 (H) 0.44 - 1.00 mg/dL   Calcium 10.0 8.9 - 10.3 mg/dL   Phosphorus 5.1 (H) 2.5 - 4.6 mg/dL   Albumin 4.0 3.5 - 5.0 g/dL   GFR, Estimated 25 (L) >60 mL/min   Anion gap 10 5 - 15  CBC with Differential/Platelet     Status: Abnormal   Collection Time: 08/02/20  4:41 AM  Result Value Ref Range   WBC 7.3 4.0 - 10.5 K/uL   RBC 3.61 (L)  3.87 - 5.11 MIL/uL   Hemoglobin 10.0 (L) 12.0 - 15.0 g/dL   HCT 33.0 (L) 36.0 - 46.0 %   MCV 91.4 80.0 - 100.0 fL   MCH 27.7 26.0 - 34.0 pg   MCHC 30.3 30.0 - 36.0 g/dL   RDW 17.0 (H) 11.5 - 15.5 %   Platelets 509 (H) 150 - 400 K/uL   nRBC 0.0 0.0 - 0.2 %   Neutrophils Relative % 54 %   Neutro Abs 4.0 1.7 - 7.7 K/uL   Lymphocytes Relative 26 %   Lymphs Abs 1.9 0.7 - 4.0 K/uL   Monocytes Relative 11 %   Monocytes Absolute 0.8 0.1 - 1.0 K/uL   Eosinophils Relative 7 %   Eosinophils Absolute 0.5 0.0 - 0.5 K/uL   Basophils Relative 1 %   Basophils Absolute 0.0 0.0 - 0.1 K/uL   Immature Granulocytes 1 %   Abs Immature Granulocytes 0.04 0.00 -  0.07 K/uL  Glucose, capillary     Status: None   Collection Time: 08/02/20  7:39 AM  Result Value Ref Range   Glucose-Capillary 91 70 - 99 mg/dL  Glucose, capillary     Status: Abnormal   Collection Time: 08/02/20 11:34 AM  Result Value Ref Range   Glucose-Capillary 116 (H) 70 - 99 mg/dL    Recent Results (from the past 240 hour(s))  SARS CORONAVIRUS 2 (TAT 6-24 HRS) Nasopharyngeal Nasopharyngeal Swab     Status: None   Collection Time: 07/25/20  3:16 PM   Specimen: Nasopharyngeal Swab  Result Value Ref Range Status   SARS Coronavirus 2 NEGATIVE NEGATIVE Final    Comment: (NOTE) SARS-CoV-2 target nucleic acids are NOT DETECTED.  The SARS-CoV-2 RNA is generally detectable in upper and lower respiratory specimens during the acute phase of infection. Negative results do not preclude SARS-CoV-2 infection, do not rule out co-infections with other pathogens, and should not be used as the sole basis for treatment or other patient management decisions. Negative results must be combined with clinical observations, patient history, and epidemiological information. The expected result is Negative.  Fact Sheet for Patients: SugarRoll.be  Fact Sheet for Healthcare  Providers: https://www.woods-mathews.com/  This test is not yet approved or cleared by the [place] FDA and  has been authorized for detection and/or diagnosis of SARS-CoV-2 by FDA under an Emergency Use Authorization (EUA). This EUA will remain  in effect (meaning this test can be used) for the duration of the COVID-19 declaration under Se ction 564(b)(1) of the Act, 21 U.S.C. section 360bbb-3(b)(1), unless the authorization is terminated or revoked sooner.  Performed at [facility], [place] [address], [place], [place] [address]   Urine Culture     Status: Abnormal   Collection Time: 07/27/20  4:40 PM   Specimen: Urine, Random  Result Value Ref Range Status   Specimen Description   Final    URINE, RANDOM Performed at [facility] [address], [place], [place] [address]    Special Requests   Final    NONE Performed at [facility], [place] [address] [place], [place] [address]    Culture 50,000 COLONIES/mL KLEBSIELLA PNEUMONIAE (A)  Final   Report Status 07/30/2020 FINAL  Final   Organism ID, Bacteria KLEBSIELLA PNEUMONIAE (A)  Final      Susceptibility   Klebsiella pneumoniae - MIC*    AMPICILLIN >=32 RESISTANT Resistant     CEFAZOLIN <=4 SENSITIVE Sensitive     CEFEPIME <=0.12 SENSITIVE Sensitive     CEFTRIAXONE <=0.25 SENSITIVE Sensitive     CIPROFLOXACIN <=0.25 SENSITIVE Sensitive     GENTAMICIN <=1 SENSITIVE Sensitive     IMIPENEM <=0.25 SENSITIVE Sensitive     NITROFURANTOIN 64 INTERMEDIATE Intermediate     TRIMETH/SULFA <=20 SENSITIVE Sensitive     AMPICILLIN/SULBACTAM 4 SENSITIVE Sensitive     PIP/TAZO <=4 SENSITIVE Sensitive     * 50,000 COLONIES/mL KLEBSIELLA PNEUMONIAE     Radiology Studies: No results found. US RENAL  Final Result    DG Chest Port 1 View  Final Result      Scheduled Meds:  busPIRone  5 mg Oral Q8H   DULoxetine  30 mg Oral BID   famotidine  20 mg Oral QHS   furosemide  60  mg Intravenous Q12H   insulin aspart  0-5 Units Subcutaneous QHS   insulin aspart  0-6 Units Subcutaneous TID WC   midodrine  15 mg  Oral TID WC   pregabalin  75 mg Oral BID   senna-docusate  1 tablet Oral Daily   PRN Meds: acetaminophen **OR** acetaminophen, albuterol, ALPRAZolam, diphenhydrAMINE, ondansetron **OR** ondansetron (ZOFRAN) IV, oxyCODONE, traZODone Continuous Infusions:     LOS: 7 days  Time spent: Greater than 50% of the 35 minute visit was spent in counseling/coordination of care for the patient as laid out in the A&P.   Dwyane Dee, MD [facility] 08/02/2020, 3:58 PM

## 2020-08-03 LAB — CBC WITH DIFFERENTIAL/PLATELET
Abs Immature Granulocytes: 0.04 10*3/uL (ref 0.00–0.07)
Basophils Absolute: 0 10*3/uL (ref 0.0–0.1)
Basophils Relative: 1 %
Eosinophils Absolute: 0.5 10*3/uL (ref 0.0–0.5)
Eosinophils Relative: 6 %
HCT: 32.8 % — ABNORMAL LOW (ref 36.0–46.0)
Hemoglobin: 10 g/dL — ABNORMAL LOW (ref 12.0–15.0)
Immature Granulocytes: 1 %
Lymphocytes Relative: 29 %
Lymphs Abs: 2.2 10*3/uL (ref 0.7–4.0)
MCH: 27.6 pg (ref 26.0–34.0)
MCHC: 30.5 g/dL (ref 30.0–36.0)
MCV: 90.6 fL (ref 80.0–100.0)
Monocytes Absolute: 0.8 10*3/uL (ref 0.1–1.0)
Monocytes Relative: 10 %
Neutro Abs: 4.1 10*3/uL (ref 1.7–7.7)
Neutrophils Relative %: 53 %
Platelets: 491 10*3/uL — ABNORMAL HIGH (ref 150–400)
RBC: 3.62 MIL/uL — ABNORMAL LOW (ref 3.87–5.11)
RDW: 16.9 % — ABNORMAL HIGH (ref 11.5–15.5)
WBC: 7.6 10*3/uL (ref 4.0–10.5)
nRBC: 0.3 % — ABNORMAL HIGH (ref 0.0–0.2)

## 2020-08-03 LAB — RENAL FUNCTION PANEL
Albumin: 3.7 g/dL (ref 3.5–5.0)
Anion gap: 9 (ref 5–15)
BUN: 79 mg/dL — ABNORMAL HIGH (ref 8–23)
CO2: 30 mmol/L (ref 22–32)
Calcium: 10 mg/dL (ref 8.9–10.3)
Chloride: 100 mmol/L (ref 98–111)
Creatinine, Ser: 2.27 mg/dL — ABNORMAL HIGH (ref 0.44–1.00)
GFR, Estimated: 23 mL/min — ABNORMAL LOW (ref >60)
Glucose, Bld: 94 mg/dL (ref 70–99)
Phosphorus: 5.3 mg/dL — ABNORMAL HIGH (ref 2.5–4.6)
Potassium: 4.9 mmol/L (ref 3.5–5.1)
Sodium: 139 mmol/L (ref 135–145)

## 2020-08-03 LAB — GLUCOSE, CAPILLARY
Glucose-Capillary: 68 mg/dL — ABNORMAL LOW (ref 70–99)
Glucose-Capillary: 78 mg/dL (ref 70–99)
Glucose-Capillary: 118 mg/dL — ABNORMAL HIGH (ref 70–99)
Glucose-Capillary: 107 mg/dL — ABNORMAL HIGH (ref 70–99)
Glucose-Capillary: 117 mg/dL — ABNORMAL HIGH (ref 70–99)

## 2020-08-03 LAB — MAGNESIUM: Magnesium: 2.1 mg/dL (ref 1.7–2.4)

## 2020-08-03 MED ORDER — HEPARIN SODIUM (PORCINE) 5000 UNIT/ML IJ SOLN
5000.0000 [IU] | Freq: Three times a day (TID) | INTRAMUSCULAR | Status: DC
  Administered 2020-08-03 – 2020-08-06 (×10): 5000 [IU] via SUBCUTANEOUS
  Filled 2020-08-03 (×10): qty 1

## 2020-08-03 MED ORDER — FUROSEMIDE 10 MG/ML IJ SOLN
80.0000 mg | Freq: Two times a day (BID) | INTRAMUSCULAR | Status: DC
  Administered 2020-08-03 – 2020-08-04 (×2): 80 mg via INTRAVENOUS
  Filled 2020-08-03 (×2): qty 8

## 2020-08-03 MED ORDER — SENNOSIDES-DOCUSATE SODIUM 8.6-50 MG PO TABS
1.0000 | ORAL_TABLET | Freq: Two times a day (BID) | ORAL | Status: DC
  Administered 2020-08-03 – 2020-08-06 (×4): 1 via ORAL
  Filled 2020-08-03 (×5): qty 1

## 2020-08-03 MED ORDER — POLYETHYLENE GLYCOL 3350 17 G PO PACK
17.0000 g | PACK | Freq: Every day | ORAL | Status: DC
  Administered 2020-08-03 – 2020-08-05 (×3): 17 g via ORAL
  Filled 2020-08-03 (×3): qty 1

## 2020-08-03 MED ORDER — MAGNESIUM CITRATE PO SOLN
1.0000 | Freq: Once | ORAL | Status: AC
  Administered 2020-08-03: 1 via ORAL
  Filled 2020-08-03: qty 296

## 2020-08-03 NOTE — Progress Notes (Signed)
[name] [name]  Assessment/ Plan: Pt is a 65 y.o. yo female with morbid obesity, bedbound, DM, HTN, neuropathy, chronic diastolic CHF, anemia, CKD stage III, OSA on CPAP, anxiety depression who was sent by her PCP to ER for abnormal lab result, seen as a consultation for AKI on CKD and hyperkalemia.  Problems:  Acute kidney injury on CKDIIIa: b/l cr 1.3-1.5, due to volume overload and frequent hypotensive episode causing reduced renal perfusion.  Urinalysis bland.  Kidney ultrasound limited because of morbid obesity. Initially responded with IV diuretics however it was switched to oral Lasix on 6/9 because hypotension (SBP 80s) . Midodrine increased to 15 mg 3 times daily. Diuresing w/ IV lasix w/ ^'d creat 2.1- 2.4, eGFR 21- 25 ml/min. She is not a HD candidate d/t bedbound state. See below. Will continue diuresing for several more days until edema is better or creat goes > 2.5.  Hyperkalemia: Due to lisinopril and AKI. ACEi/ ARB's should be avoided indefinitely in this pt w/ suspected cor pulmonale / R sided edema and AKI w/ CKD.  Vol overload - R sided volume overload w/o any evidence of pulm edema/ resp issues. In severely morbid obese pt this looks the most like R HF (even though not seen on echo).  Anemia: Received a unit of blood transfusion on 6/5.  Iron saturation 11% and serum iron 42.  I ordered IV iron.  Monitor hemoglobin.  [name], MD 08/03/2020, 4:34 PM      Subjective:  diuresing well, cont IV lasix  Objective Vital signs in last 24 hours: Vitals:   08/03/20 0500 08/03/20 0621 08/03/20 0829 08/03/20 1132  BP:  (!) 122/44 (!) 128/41 (!) 120/52  Pulse:  71 77 73  Resp:  '19 18 16  ' Temp:  97.8 F (36.6 C)  (!) 97.4 F (36.3 C)  TempSrc:  Oral  Oral  SpO2:  99% 96% 95%  Weight: (!) 145.3 kg     Height:       Weight change: -3.1 kg  Intake/Output Summary (Last 24 hours) at 08/03/2020 1634 Last data filed at 08/03/2020 1300 Gross  per 24 hour  Intake 1230 ml  Output 2200 ml  Net -970 ml        Labs: Basic Metabolic Panel: Recent Labs  Lab 08/01/20 0434 08/02/20 0441 08/03/20 0431  NA 139 140 139  K 4.8 5.0 4.9  CL 102 103 100  CO2 '27 27 30  ' GLUCOSE 80 84 94  BUN 79* 79* 79*  CREATININE 2.14* 2.12* 2.27*  CALCIUM 9.5 10.0 10.0  PHOS 5.2* 5.1* 5.3*    Liver Function Tests: Recent Labs  Lab 08/01/20 0434 08/02/20 0441 08/03/20 0431  ALBUMIN 3.5 4.0 3.7    No results for input(s): LIPASE, AMYLASE in the last 168 hours. No results for input(s): AMMONIA in the last 168 hours. CBC: Recent Labs  Lab 07/28/20 0410 07/29/20 0426 08/02/20 0441 08/03/20 0431  WBC 6.3 6.1 7.3 7.6  NEUTROABS 3.4  --  4.0 4.1  HGB 7.4* 7.9* 10.0* 10.0*  HCT 24.0* 25.7* 33.0* 32.8*  MCV 89.6 91.1 91.4 90.6  PLT 347 391 509* 491*    Cardiac Enzymes: No results for input(s): CKTOTAL, CKMB, CKMBINDEX, TROPONINI in the last 168 hours. CBG: Recent Labs  Lab 08/02/20 2142 08/03/20 0739 08/03/20 0821 08/03/20 1128 08/03/20 1626  GLUCAP 96 68* 78 118* 107*     Iron Studies:  No results for input(s): IRON, TIBC, TRANSFERRIN, FERRITIN in  the last 72 hours.  Studies/Results: No results found.  Medications: Infusions:    Scheduled Medications:  busPIRone  5 mg Oral Q8H   DULoxetine  30 mg Oral BID   famotidine  20 mg Oral QHS   heparin  5,000 Units Subcutaneous Q8H   insulin aspart  0-5 Units Subcutaneous QHS   insulin aspart  0-6 Units Subcutaneous TID WC   midodrine  15 mg Oral TID WC   polyethylene glycol  17 g Oral Daily   pregabalin  75 mg Oral BID   senna-docusate  1 tablet Oral BID    have reviewed scheduled and prn medications.  Physical Exam: General: Morbidly obese female with short neck, lying on bed comfortable Heart:RRR, s1s2 nl, no rubs Lungs: Distant breath sound. Abdomen:soft, obese Extremities: diffuse 2 + bilat LE edema from feet to thighs Neurology: Alert, awake and  oriented.  Sandy Salaam [facility] 08/03/2020,4:34 PM  LOS: 8 days

## 2020-08-03 NOTE — Progress Notes (Signed)
Placed patient on home CPAP unit for the night.  

## 2020-08-03 NOTE — Progress Notes (Signed)
Progress Note    [name] [name]   OQH:[id number]  DOB: 1955/11/16  DOA: 07/25/2020     8  PCP: Pcp, No  CC: abnormal labs  Hospital Course: 65 y.o. female with medical history significant of diabetes mellitus type 2, diabetic neuropathy, hypertension, chronic diastolic CHF, chronic anemia, chronic kidney disease stage III, morbid obesity, hemorrhagic shock secondary to GI bleeding requiring embolization, OSA on CPAP, anxiety and depression presented to the hospital with elevated potassium.  On presentation, potassium was 6.3 with creatinine of 1.25, hemoglobin of 7.7  with no EKG changes of hyperkalemia.  She was given a dose of Lokelma. Chest x-ray showed questionable right lower lobe infiltrate versus artifact.  She was started on Lasix and given IV sodium bicarbonate/calcium gluconate/regular insulin/dextrose/nebulized albuterol.  Repeat potassium was still high. Nephrology was consulted.  Interval History:  Her 2 sisters were present this morning and updated.  We had a long discussion trying to explain her clinical course and why her kidneys are doing poorly and she is not a good dialysis candidate.  Also discussed that palliative care will be coming to see patient at some point also.  Sisters are hoping that the patient's kidneys continue to improve and that she is able to return home.  They do agree that the patient's dietary habits are extremely poor and that she has been bedbound for over a year.  ROS: Constitutional: negative for chills and fevers, Respiratory: negative for cough, Cardiovascular: negative for chest pain and Gastrointestinal: negative for abdominal pain  Assessment & Plan: Hyperkalemia - intermittent -Most likely related to lisinopril use. Lisinopril on hold. -Potassium 6.3 on presentation.  Lokelma given in the ED.  EKG without significant changes of hyperkalemia.  She was treated with [facility] along with Lasix and given IV sodium bicarbonate/calcium  gluconate/regular insulin/dextrose/nebulized albuterol.  - K back down today  Acute on chronic kidney disease stage IIIa -Possibly from volume overload.   -Renal ultrasound was negative for hydronephrosis -Diuresing fairly well on Lasix but BUN and creat have been uptrending and she had been developing more fatigue and asterixis noted on 6/11.  Per nephrology, Lasix to finish end of 08/02/2020 -Palliative care consulted for [place] discussions per nephrology recommendation.  Dialysis not offered due to her bedbound state and poor tolerability - BUN 79 and creat 2.27 today -Lasix being continued per nephrology until creatinine > 2.5 or edema improves further   Acute on chronic diastolic heart failure -Patient is morbidly obese as well and volume status assessment is difficult.  Echo showed EF of 65 to 70% - Lasix plan as above.  Strict input and output.  Daily weights.  Fluid restriction of 1200 cc a day.   Acute on chronic anemia of chronic disease -Possibly from kidney disease.  Hemoglobin 6.9 on 07/26/2020.   Transfused 1 unit packed red cells.  DC'd Lovenox. No overt signs of bleeding. - Hgb now improved to 10 g/dL; will try resuming HSQ and see if Hgb stays up   Hx Hypertension Hypotension -Monitor blood pressure.  Blood pressure on the lower side. - midodrine added and increased per nephrology  Thrombocytosis - resolved  -Possibly reactive.   Diabetes mellitus type 2 -CBGs with SSI. Carb modified diet.      Anxiety and depression -Continue home regimen   Morbid obesity -Outpatient follow-up   Generalized deconditioning -PT eval - patient declining SNF; wishes to return home at time of discharge  - says she has an aide present from 9 am-12  pm and 5pm-7:30 pm  Old records reviewed in assessment of this patient  Antimicrobials: Rocephin 6/8 >> 6/10  DVT prophylaxis: Place and maintain sequential compression device Start: 07/26/20 0743   Code Status:   Code Status: Full  Code Family Communication:   Disposition Plan: Status is: Inpatient  Remains inpatient appropriate because:IV treatments appropriate due to intensity of illness or inability to take PO and Inpatient level of care appropriate due to severity of illness   Dispo: The patient is from: Home              Anticipated d/c is to: Home              Patient currently is not medically stable to d/c.   Difficult to place patient No Risk of unplanned readmission score: Unplanned Admission- Pilot do not use: 34.9   Objective: Blood pressure (!) 120/52, pulse 73, temperature (!) 97.4 F (36.3 C), temperature source Oral, resp. rate 16, height 5\' 2"  (1.575 m), weight (!) 145.3 kg, SpO2 95 %.  Examination: General appearance: alert, cooperative and no distress Head: Normocephalic, without obvious abnormality, atraumatic Eyes:  EOMI Lungs:  distant breath sounds Heart: regular rate and rhythm and S1, S2 normal Abdomen:  obese, soft, NT Extremities:  obese, LE edema noted Skin: mobility and turgor normal Neurologic: Grossly normal  Consultants:  Nephrology  Procedures:    Data Reviewed: I have personally reviewed following labs and imaging studies Results for orders placed or performed during the hospital encounter of 07/25/20 (from the past 24 hour(s))  Glucose, capillary     Status: Abnormal   Collection Time: 08/02/20  4:35 PM  Result Value Ref Range   Glucose-Capillary 133 (H) 70 - 99 mg/dL  Glucose, capillary     Status: None   Collection Time: 08/02/20  9:42 PM  Result Value Ref Range   Glucose-Capillary 96 70 - 99 mg/dL  Renal function panel     Status: Abnormal   Collection Time: 08/03/20  4:31 AM  Result Value Ref Range   Sodium 139 135 - 145 mmol/L   Potassium 4.9 3.5 - 5.1 mmol/L   Chloride 100 98 - 111 mmol/L   CO2 30 22 - 32 mmol/L   Glucose, Bld 94 70 - 99 mg/dL   BUN 79 (H) 8 - 23 mg/dL   Creatinine, Ser 2.27 (H) 0.44 - 1.00 mg/dL   Calcium 10.0 8.9 - 10.3 mg/dL    Phosphorus 5.3 (H) 2.5 - 4.6 mg/dL   Albumin 3.7 3.5 - 5.0 g/dL   GFR, Estimated 23 (L) >60 mL/min   Anion gap 9 5 - 15  CBC with Differential/Platelet     Status: Abnormal   Collection Time: 08/03/20  4:31 AM  Result Value Ref Range   WBC 7.6 4.0 - 10.5 K/uL   RBC 3.62 (L) 3.87 - 5.11 MIL/uL   Hemoglobin 10.0 (L) 12.0 - 15.0 g/dL   HCT 32.8 (L) 36.0 - 46.0 %   MCV 90.6 80.0 - 100.0 fL   MCH 27.6 26.0 - 34.0 pg   MCHC 30.5 30.0 - 36.0 g/dL   RDW 16.9 (H) 11.5 - 15.5 %   Platelets 491 (H) 150 - 400 K/uL   nRBC 0.3 (H) 0.0 - 0.2 %   Neutrophils Relative % 53 %   Neutro Abs 4.1 1.7 - 7.7 K/uL   Lymphocytes Relative 29 %   Lymphs Abs 2.2 0.7 - 4.0 K/uL   Monocytes Relative 10 %  Monocytes Absolute 0.8 0.1 - 1.0 K/uL   Eosinophils Relative 6 %   Eosinophils Absolute 0.5 0.0 - 0.5 K/uL   Basophils Relative 1 %   Basophils Absolute 0.0 0.0 - 0.1 K/uL   Immature Granulocytes 1 %   Abs Immature Granulocytes 0.04 0.00 - 0.07 K/uL  Magnesium     Status: None   Collection Time: 08/03/20  4:31 AM  Result Value Ref Range   Magnesium 2.1 1.7 - 2.4 mg/dL  Glucose, capillary     Status: Abnormal   Collection Time: 08/03/20  7:39 AM  Result Value Ref Range   Glucose-Capillary 68 (L) 70 - 99 mg/dL  Glucose, capillary     Status: None   Collection Time: 08/03/20  8:21 AM  Result Value Ref Range   Glucose-Capillary 78 70 - 99 mg/dL  Glucose, capillary     Status: Abnormal   Collection Time: 08/03/20 11:28 AM  Result Value Ref Range   Glucose-Capillary 118 (H) 70 - 99 mg/dL    Recent Results (from the past 240 hour(s))  SARS CORONAVIRUS 2 (TAT 6-24 HRS) Nasopharyngeal Nasopharyngeal Swab     Status: None   Collection Time: 07/25/20  3:16 PM   Specimen: Nasopharyngeal Swab  Result Value Ref Range Status   SARS Coronavirus 2 NEGATIVE NEGATIVE Final    Comment: (NOTE) SARS-CoV-2 target nucleic acids are NOT DETECTED.  The SARS-CoV-2 RNA is generally detectable in upper and  lower respiratory specimens during the acute phase of infection. Negative results do not preclude SARS-CoV-2 infection, do not rule out co-infections with other pathogens, and should not be used as the sole basis for treatment or other patient management decisions. Negative results must be combined with clinical observations, patient history, and epidemiological information. The expected result is Negative.  Fact Sheet for Patients: SugarRoll.be  Fact Sheet for Healthcare Providers: https://www.woods-mathews.com/  This test is not yet approved or cleared by the [place] FDA and  has been authorized for detection and/or diagnosis of SARS-CoV-2 by FDA under an Emergency Use Authorization (EUA). This EUA will remain  in effect (meaning this test can be used) for the duration of the COVID-19 declaration under Se ction 564(b)(1) of the Act, 21 U.S.C. section 360bbb-3(b)(1), unless the authorization is terminated or revoked sooner.  Performed at [facility], [place] [address], [place], [place] [address]   Urine Culture     Status: Abnormal   Collection Time: 07/27/20  4:40 PM   Specimen: Urine, Random  Result Value Ref Range Status   Specimen Description   Final    URINE, RANDOM Performed at [place] [address] [place], [place] [address]    Special Requests   Final    NONE Performed at [facility], [place] [address], [place], [place] [address]    Culture 50,000 COLONIES/mL KLEBSIELLA PNEUMONIAE (A)  Final   Report Status 07/30/2020 FINAL  Final   Organism ID, Bacteria KLEBSIELLA PNEUMONIAE (A)  Final      Susceptibility   Klebsiella pneumoniae - MIC*    AMPICILLIN >=32 RESISTANT Resistant     CEFAZOLIN <=4 SENSITIVE Sensitive     CEFEPIME <=0.12 SENSITIVE Sensitive     CEFTRIAXONE <=0.25 SENSITIVE Sensitive     CIPROFLOXACIN <=0.25 SENSITIVE Sensitive     GENTAMICIN <=1 SENSITIVE  Sensitive     IMIPENEM <=0.25 SENSITIVE Sensitive     NITROFURANTOIN 64 INTERMEDIATE Intermediate     TRIMETH/SULFA <=20 SENSITIVE Sensitive  AMPICILLIN/SULBACTAM 4 SENSITIVE Sensitive     PIP/TAZO <=4 SENSITIVE Sensitive     * 50,000 COLONIES/mL KLEBSIELLA PNEUMONIAE     Radiology Studies: No results found. US RENAL  Final Result    DG Chest Port 1 View  Final Result      Scheduled Meds:  busPIRone  5 mg Oral Q8H   DULoxetine  30 mg Oral BID   famotidine  20 mg Oral QHS   insulin aspart  0-5 Units Subcutaneous QHS   insulin aspart  0-6 Units Subcutaneous TID WC   midodrine  15 mg Oral TID WC   polyethylene glycol  17 g Oral Daily   pregabalin  75 mg Oral BID   senna-docusate  1 tablet Oral BID   PRN Meds: acetaminophen **OR** acetaminophen, albuterol, ALPRAZolam, diphenhydrAMINE, ondansetron **OR** ondansetron (ZOFRAN) IV, oxyCODONE, traZODone Continuous Infusions:     LOS: 8 days  Time spent: Greater than 50% of the 35 minute visit was spent in counseling/coordination of care for the patient as laid out in the A&P.   Dwyane Dee, MD [facility] 08/03/2020, 3:46 PM

## 2020-08-03 NOTE — Plan of Care (Signed)
  Problem: Education: Goal: Knowledge of General Education information will improve Description: Including pain rating scale, medication(s)/side effects and non-pharmacologic comfort measures Outcome: Progressing   Problem: Health Behavior/Discharge Planning: Goal: Ability to manage health-related needs will improve Outcome: Progressing   Problem: Clinical Measurements: Goal: Ability to maintain clinical measurements within normal limits will improve Outcome: Progressing Goal: Diagnostic test results will improve Outcome: Progressing Goal: Cardiovascular complication will be avoided Outcome: Progressing   Problem: Activity: Goal: Risk for activity intolerance will decrease Outcome: Progressing   Problem: Nutrition: Goal: Adequate nutrition will be maintained Outcome: Progressing   Problem: Elimination: Goal: Will not experience complications related to bowel motility Outcome: Progressing Goal: Will not experience complications related to urinary retention Outcome: Progressing   Problem: Pain Managment: Goal: General experience of comfort will improve Outcome: Progressing   Problem: Safety: Goal: Ability to remain free from injury will improve Outcome: Progressing   Problem: Skin Integrity: Goal: Risk for impaired skin integrity will decrease Outcome: Progressing

## 2020-08-04 DIAGNOSIS — N183 Chronic kidney disease, stage 3 unspecified: Secondary | ICD-10-CM

## 2020-08-04 DIAGNOSIS — Z7189 Other specified counseling: Secondary | ICD-10-CM

## 2020-08-04 DIAGNOSIS — Z515 Encounter for palliative care: Secondary | ICD-10-CM

## 2020-08-04 LAB — BASIC METABOLIC PANEL
Anion gap: 12 (ref 5–15)
Anion gap: 10 (ref 5–15)
BUN: 80 mg/dL — ABNORMAL HIGH (ref 8–23)
BUN: 82 mg/dL — ABNORMAL HIGH (ref 8–23)
CO2: 33 mmol/L — ABNORMAL HIGH (ref 22–32)
CO2: 34 mmol/L — ABNORMAL HIGH (ref 22–32)
Calcium: 11 mg/dL — ABNORMAL HIGH (ref 8.9–10.3)
Calcium: 10.8 mg/dL — ABNORMAL HIGH (ref 8.9–10.3)
Chloride: 96 mmol/L — ABNORMAL LOW (ref 98–111)
Chloride: 95 mmol/L — ABNORMAL LOW (ref 98–111)
Creatinine, Ser: 1.9 mg/dL — ABNORMAL HIGH (ref 0.44–1.00)
Creatinine, Ser: 1.84 mg/dL — ABNORMAL HIGH (ref 0.44–1.00)
GFR, Estimated: 29 mL/min — ABNORMAL LOW (ref >60)
GFR, Estimated: 30 mL/min — ABNORMAL LOW (ref >60)
Glucose, Bld: 120 mg/dL — ABNORMAL HIGH (ref 70–99)
Glucose, Bld: 115 mg/dL — ABNORMAL HIGH (ref 70–99)
Potassium: 5.5 mmol/L — ABNORMAL HIGH (ref 3.5–5.1)
Potassium: 5.3 mmol/L — ABNORMAL HIGH (ref 3.5–5.1)
Sodium: 141 mmol/L (ref 135–145)
Sodium: 139 mmol/L (ref 135–145)

## 2020-08-04 LAB — CBC WITH DIFFERENTIAL/PLATELET
Abs Immature Granulocytes: 0.04 10*3/uL (ref 0.00–0.07)
Basophils Absolute: 0.1 10*3/uL (ref 0.0–0.1)
Basophils Relative: 1 %
Eosinophils Absolute: 0.5 10*3/uL (ref 0.0–0.5)
Eosinophils Relative: 6 %
HCT: 37.9 % (ref 36.0–46.0)
Hemoglobin: 11.5 g/dL — ABNORMAL LOW (ref 12.0–15.0)
Immature Granulocytes: 1 %
Lymphocytes Relative: 23 %
Lymphs Abs: 1.9 10*3/uL (ref 0.7–4.0)
MCH: 27.6 pg (ref 26.0–34.0)
MCHC: 30.3 g/dL (ref 30.0–36.0)
MCV: 91.1 fL (ref 80.0–100.0)
Monocytes Absolute: 0.7 10*3/uL (ref 0.1–1.0)
Monocytes Relative: 8 %
Neutro Abs: 5 10*3/uL (ref 1.7–7.7)
Neutrophils Relative %: 61 %
Platelets: 563 10*3/uL — ABNORMAL HIGH (ref 150–400)
RBC: 4.16 MIL/uL (ref 3.87–5.11)
RDW: 16.9 % — ABNORMAL HIGH (ref 11.5–15.5)
WBC: 8.2 10*3/uL (ref 4.0–10.5)
nRBC: 0 % (ref 0.0–0.2)

## 2020-08-04 LAB — GLUCOSE, CAPILLARY
Glucose-Capillary: 78 mg/dL (ref 70–99)
Glucose-Capillary: 104 mg/dL — ABNORMAL HIGH (ref 70–99)
Glucose-Capillary: 100 mg/dL — ABNORMAL HIGH (ref 70–99)
Glucose-Capillary: 133 mg/dL — ABNORMAL HIGH (ref 70–99)

## 2020-08-04 LAB — MAGNESIUM: Magnesium: 2.6 mg/dL — ABNORMAL HIGH (ref 1.7–2.4)

## 2020-08-04 MED ORDER — SODIUM ZIRCONIUM CYCLOSILICATE 10 G PO PACK
10.0000 g | PACK | Freq: Every day | ORAL | Status: DC
  Administered 2020-08-04: 10 g via ORAL
  Filled 2020-08-04 (×2): qty 1

## 2020-08-04 MED ORDER — FUROSEMIDE 10 MG/ML IJ SOLN
60.0000 mg | Freq: Two times a day (BID) | INTRAMUSCULAR | Status: DC
  Administered 2020-08-04 – 2020-08-05 (×2): 60 mg via INTRAVENOUS
  Filled 2020-08-04 (×2): qty 6

## 2020-08-04 MED ORDER — HYDROCORTISONE 1 % EX CREA
TOPICAL_CREAM | Freq: Three times a day (TID) | CUTANEOUS | Status: DC | PRN
  Filled 2020-08-04: qty 28

## 2020-08-04 NOTE — Progress Notes (Addendum)
[facility] NEPHROLOGY PROGRESS NOTE  Assessment/ Plan: Pt is a 66 y.o. yo female with morbid obesity, bedbound, DM, HTN, neuropathy, chronic diastolic CHF, anemia, CKD stage III, OSA on CPAP, anxiety depression who was sent by her PCP to ER for abnormal lab result, seen as a consultation for AKI on CKD and hyperkalemia.  Problems:  Acute kidney injury on CKDIIIa: b/l cr 1.3-1.5, due to volume overload and frequent hypotensive episode causing reduced renal perfusion.  Urinalysis bland.  Kidney ultrasound limited because of morbid obesity. Initially responded with IV diuretics however it was switched to oral Lasix on 6/9 because hypotension (SBP 80s) . Midodrine increased to 15 mg 3 times daily. Diuresing w/ IV lasix w/ ^'d creat 2.1- 2.4, eGFR 21- 25 ml/min. She is not a HD candidate d/t bedbound state. Serum CO2 up today to 33. Cont IV lasix for now, diuresis seems to be helping still. May be limited if CO2 rises too high. Will lower Lasix to 65m bid IV.  Hyperkalemia: Due to lisinopril and AKI. ACEi/ ARB's should be avoided indefinitely in this pt w/ suspected cor pulmonale and sig CKD.  Vol overload - R sided volume overload w/o any evidence of pulm edema/ resp issues. In severely morbid obese pt this looks the most like R HF (even though not seen on echo).  Anemia: Received a unit of blood transfusion on 6/5.  Iron saturation 11% and serum iron 42.  I ordered IV iron.  Monitor hemoglobin.  RKelly Splinter MD 08/04/2020, 3:23 PM      Subjective:  diuresing well, cont IV lasix  Objective Vital signs in last 24 hours: Vitals:   08/03/20 1132 08/03/20 2115 08/04/20 0611 08/04/20 0629  BP: (!) 120/52 (!) 131/43  (!) 124/48  Pulse: 73 73  73  Resp: '16 20  17  ' Temp: (!) 97.4 F (36.3 C) 97.7 F (36.5 C)  (!) 97.5 F (36.4 C)  TempSrc: Oral Oral  Oral  SpO2: 95% 93%  100%  Weight:   (!) (P) 143.9 kg   Height:       Weight change:   Intake/Output Summary (Last 24 hours)  at 08/04/2020 1523 Last data filed at 08/04/2020 0950 Gross per 24 hour  Intake 480 ml  Output 2750 ml  Net -2270 ml        Labs: Basic Metabolic Panel: Recent Labs  Lab 08/01/20 0434 08/02/20 0441 08/03/20 0431 08/04/20 0403 08/04/20 1309  NA 139 140 139 141 139  K 4.8 5.0 4.9 5.5* 5.3*  CL 102 103 100 96* 95*  CO2 '27 27 30 ' 33* 34*  GLUCOSE 80 84 94 120* 115*  BUN 79* 79* 79* 80* 82*  CREATININE 2.14* 2.12* 2.27* 1.90* 1.84*  CALCIUM 9.5 10.0 10.0 11.0* 10.8*  PHOS 5.2* 5.1* 5.3*  --   --     Liver Function Tests: Recent Labs  Lab 08/01/20 0434 08/02/20 0441 08/03/20 0431  ALBUMIN 3.5 4.0 3.7    No results for input(s): LIPASE, AMYLASE in the last 168 hours. No results for input(s): AMMONIA in the last 168 hours. CBC: Recent Labs  Lab 07/29/20 0426 08/02/20 0441 08/03/20 0431 08/04/20 0403  WBC 6.1 7.3 7.6 8.2  NEUTROABS  --  4.0 4.1 5.0  HGB 7.9* 10.0* 10.0* 11.5*  HCT 25.7* 33.0* 32.8* 37.9  MCV 91.1 91.4 90.6 91.1  PLT 391 509* 491* 563*    Cardiac Enzymes: No results for input(s): CKTOTAL, CKMB, CKMBINDEX, TROPONINI in the last 168  hours. CBG: Recent Labs  Lab 08/03/20 1128 08/03/20 1626 08/03/20 2112 08/04/20 0749 08/04/20 1146  GLUCAP 118* 107* 117* 78 104*     Iron Studies:  No results for input(s): IRON, TIBC, TRANSFERRIN, FERRITIN in the last 72 hours.  Studies/Results: No results found.  Medications: Infusions:    Scheduled Medications:  busPIRone  5 mg Oral Q8H   DULoxetine  30 mg Oral BID   famotidine  20 mg Oral QHS   furosemide  60 mg Intravenous BID   heparin  5,000 Units Subcutaneous Q8H   insulin aspart  0-5 Units Subcutaneous QHS   insulin aspart  0-6 Units Subcutaneous TID WC   midodrine  15 mg Oral TID WC   polyethylene glycol  17 g Oral Daily   pregabalin  75 mg Oral BID   senna-docusate  1 tablet Oral BID   sodium zirconium cyclosilicate  10 g Oral Daily    have reviewed scheduled and prn  medications.  Physical Exam: General: Morbidly obese female with short neck, lying on bed comfortable Heart:RRR, s1s2 nl, no rubs Lungs: Distant breath sound. Abdomen:soft, obese Extremities: diffuse 2 + bilat LE edema from feet to thighs Neurology: Alert, awake and oriented.  Sandy Salaam [employer] [place] 08/04/2020,3:23 PM  LOS: 9 days

## 2020-08-04 NOTE — Progress Notes (Signed)
Progress Note    [name] [name]   VQM:[id number]  DOB: 29-Apr-1955  DOA: 07/25/2020     9  PCP: Pcp, No  CC: abnormal labs  Hospital Course: 65 y.o. female with medical history significant of diabetes mellitus type 2, diabetic neuropathy, hypertension, chronic diastolic CHF, chronic anemia, chronic kidney disease stage III, morbid obesity, hemorrhagic shock secondary to GI bleeding requiring embolization, OSA on CPAP, anxiety and depression presented to the hospital with elevated potassium.  On presentation, potassium was 6.3 with creatinine of 1.25, hemoglobin of 7.7  with no EKG changes of hyperkalemia.  She was given a dose of Lokelma. Chest x-ray showed questionable right lower lobe infiltrate versus artifact.  She was started on Lasix and given IV sodium bicarbonate/calcium gluconate/regular insulin/dextrose/nebulized albuterol.  Repeat potassium was still high. Nephrology was consulted.  Interval History:  This morning says breathing feels fine. Thinks swelling in legs somewhat improved. No chest pain, has appetite.   ROS: Constitutional: negative for chills and fevers, Respiratory: negative for cough, Cardiovascular: negative for chest pain and Gastrointestinal: negative for abdominal pain  Assessment & Plan: Hyperkalemia - intermittent -Most likely related to lisinopril use. Lisinopril on hold. -Potassium 6.3 on presentation.  Lokelma given in the ED.  EKG without significant changes of hyperkalemia.  She was treated with [facility] along with Lasix and given IV sodium bicarbonate/calcium gluconate/regular insulin/dextrose/nebulized albuterol.  - K 5.5 today, will continue lasix, re-start lokelma, repeat pm K, if remains elevated will give insulin/dextrose  Acute on chronic kidney disease stage IIIa -Possibly from volume overload.   -Renal ultrasound was negative for hydronephrosis -Diuresing fairly well on Lasix but BUN and creat have been uptrending, though today improved to  1.9 -Palliative care consulted for [place] discussions per nephrology recommendation.  Dialysis not offered due to her bedbound state and poor tolerability -Lasix being continued per nephrology until creatinine > 2.5 or edema improves further   Acute on chronic diastolic heart failure -Patient is morbidly obese as well and volume status assessment is difficult.  Echo showed EF of 65 to 70% - Lasix plan as above.  Strict input and output.  Daily weights.  Fluid restriction of 1200 cc a day. Out 2.2 L yesterday   Acute on chronic anemia of chronic disease -Possibly from kidney disease.  Hemoglobin 6.9 on 07/26/2020.   Transfused 1 unit packed red cells.  DC'd Lovenox. No overt signs of bleeding. - Hgb now improved to 10 g/dL; will try resuming HSQ and see if Hgb stays up   Hx Hypertension Hypotension -Monitor blood pressure.  Blood pressure on the lower side. - midodrine added and increased per nephrology  Thrombocytosis - resolved  -Possibly reactive.   Diabetes mellitus type 2 -CBGs with SSI. Carb modified diet.      Anxiety and depression -Continue home regimen   Morbid obesity -Outpatient follow-up   Generalized deconditioning - patient declining SNF; wishes to return home at time of discharge  - says she has an aide present from 9 am-12 pm and 5pm-7:30 pm  Old records reviewed in assessment of this patient  Antimicrobials: Rocephin 6/8 >> 6/10  DVT prophylaxis: heparin injection 5,000 Units Start: 08/03/20 1645 Place and maintain sequential compression device Start: 07/26/20 0743   Code Status:   Code Status: Full Code Family Communication:   Disposition Plan: Status is: Inpatient  Remains inpatient appropriate because:IV treatments appropriate due to intensity of illness or inability to take PO and Inpatient level of care appropriate due to severity  of illness   Dispo: The patient is from: Home              Anticipated d/c is to: Home              Patient currently  is not medically stable to d/c.   Difficult to place patient No Risk of unplanned readmission score: Unplanned Admission- Pilot do not use: 31.79   Objective: Blood pressure (!) 124/48, pulse 73, temperature (!) 97.5 F (36.4 C), temperature source Oral, resp. rate 17, height 5\' 2"  (1.575 m), weight (!) (P) 143.9 kg, SpO2 100 %.  Examination: General appearance: alert, cooperative and no distress Head: Normocephalic, without obvious abnormality, atraumatic Eyes:  EOMI Lungs:  distant breath sounds Heart: regular rate and rhythm and S1, S2 normal Abdomen:  obese, soft, NT Extremities:  obese, LE edema noted Skin: mobility and turgor normal Neurologic: Grossly normal  Consultants:  Nephrology  Procedures:    Data Reviewed: I have personally reviewed following labs and imaging studies Results for orders placed or performed during the hospital encounter of 07/25/20 (from the past 24 hour(s))  Glucose, capillary     Status: Abnormal   Collection Time: 08/03/20  4:26 PM  Result Value Ref Range   Glucose-Capillary 107 (H) 70 - 99 mg/dL  Glucose, capillary     Status: Abnormal   Collection Time: 08/03/20  9:12 PM  Result Value Ref Range   Glucose-Capillary 117 (H) 70 - 99 mg/dL  CBC with Differential/Platelet     Status: Abnormal   Collection Time: 08/04/20  4:03 AM  Result Value Ref Range   WBC 8.2 4.0 - 10.5 K/uL   RBC 4.16 3.87 - 5.11 MIL/uL   Hemoglobin 11.5 (L) 12.0 - 15.0 g/dL   HCT 37.9 36.0 - 46.0 %   MCV 91.1 80.0 - 100.0 fL   MCH 27.6 26.0 - 34.0 pg   MCHC 30.3 30.0 - 36.0 g/dL   RDW 16.9 (H) 11.5 - 15.5 %   Platelets 563 (H) 150 - 400 K/uL   nRBC 0.0 0.0 - 0.2 %   Neutrophils Relative % 61 %   Neutro Abs 5.0 1.7 - 7.7 K/uL   Lymphocytes Relative 23 %   Lymphs Abs 1.9 0.7 - 4.0 K/uL   Monocytes Relative 8 %   Monocytes Absolute 0.7 0.1 - 1.0 K/uL   Eosinophils Relative 6 %   Eosinophils Absolute 0.5 0.0 - 0.5 K/uL   Basophils Relative 1 %   Basophils Absolute  0.1 0.0 - 0.1 K/uL   Immature Granulocytes 1 %   Abs Immature Granulocytes 0.04 0.00 - 0.07 K/uL  Magnesium     Status: Abnormal   Collection Time: 08/04/20  4:03 AM  Result Value Ref Range   Magnesium 2.6 (H) 1.7 - 2.4 mg/dL  Basic metabolic panel     Status: Abnormal   Collection Time: 08/04/20  4:03 AM  Result Value Ref Range   Sodium 141 135 - 145 mmol/L   Potassium 5.5 (H) 3.5 - 5.1 mmol/L   Chloride 96 (L) 98 - 111 mmol/L   CO2 33 (H) 22 - 32 mmol/L   Glucose, Bld 120 (H) 70 - 99 mg/dL   BUN 80 (H) 8 - 23 mg/dL   Creatinine, Ser 1.90 (H) 0.44 - 1.00 mg/dL   Calcium 11.0 (H) 8.9 - 10.3 mg/dL   GFR, Estimated 29 (L) >60 mL/min   Anion gap 12 5 - 15  Glucose, capillary  Status: None   Collection Time: 08/04/20  7:49 AM  Result Value Ref Range   Glucose-Capillary 78 70 - 99 mg/dL  Glucose, capillary     Status: Abnormal   Collection Time: 08/04/20 11:46 AM  Result Value Ref Range   Glucose-Capillary 104 (H) 70 - 99 mg/dL    Recent Results (from the past 240 hour(s))  SARS CORONAVIRUS 2 (TAT 6-24 HRS) Nasopharyngeal Nasopharyngeal Swab     Status: None   Collection Time: 07/25/20  3:16 PM   Specimen: Nasopharyngeal Swab  Result Value Ref Range Status   SARS Coronavirus 2 NEGATIVE NEGATIVE Final    Comment: (NOTE) SARS-CoV-2 target nucleic acids are NOT DETECTED.  The SARS-CoV-2 RNA is generally detectable in upper and lower respiratory specimens during the acute phase of infection. Negative results do not preclude SARS-CoV-2 infection, do not rule out co-infections with other pathogens, and should not be used as the sole basis for treatment or other patient management decisions. Negative results must be combined with clinical observations, patient history, and epidemiological information. The expected result is Negative.  Fact Sheet for Patients: SugarRoll.be  Fact Sheet for Healthcare  Providers: https://www.woods-mathews.com/  This test is not yet approved or cleared by the [place] FDA and  has been authorized for detection and/or diagnosis of SARS-CoV-2 by FDA under an Emergency Use Authorization (EUA). This EUA will remain  in effect (meaning this test can be used) for the duration of the COVID-19 declaration under Se ction 564(b)(1) of the Act, 21 U.S.C. section 360bbb-3(b)(1), unless the authorization is terminated or revoked sooner.  Performed at [facility], [place] [address], [place], [place] [address]   Urine Culture     Status: Abnormal   Collection Time: 07/27/20  4:40 PM   Specimen: Urine, Random  Result Value Ref Range Status   Specimen Description   Final    URINE, RANDOM Performed at [facility] [address], [place], [place] [address]    Special Requests   Final    NONE Performed at [facility], [place] [address], [place], [place] [address]    Culture 50,000 COLONIES/mL KLEBSIELLA PNEUMONIAE (A)  Final   Report Status 07/30/2020 FINAL  Final   Organism ID, Bacteria KLEBSIELLA PNEUMONIAE (A)  Final      Susceptibility   Klebsiella pneumoniae - MIC*    AMPICILLIN >=32 RESISTANT Resistant     CEFAZOLIN <=4 SENSITIVE Sensitive     CEFEPIME <=0.12 SENSITIVE Sensitive     CEFTRIAXONE <=0.25 SENSITIVE Sensitive     CIPROFLOXACIN <=0.25 SENSITIVE Sensitive     GENTAMICIN <=1 SENSITIVE Sensitive     IMIPENEM <=0.25 SENSITIVE Sensitive     NITROFURANTOIN 64 INTERMEDIATE Intermediate     TRIMETH/SULFA <=20 SENSITIVE Sensitive     AMPICILLIN/SULBACTAM 4 SENSITIVE Sensitive     PIP/TAZO <=4 SENSITIVE Sensitive     * 50,000 COLONIES/mL KLEBSIELLA PNEUMONIAE     Radiology Studies: No results found. US RENAL  Final Result    DG Chest Port 1 View  Final Result      Scheduled Meds:  busPIRone  5 mg Oral Q8H   DULoxetine  30 mg Oral BID   famotidine  20 mg Oral QHS   furosemide  80  mg Intravenous BID   heparin  5,000 Units Subcutaneous Q8H   insulin aspart  0-5 Units Subcutaneous QHS   insulin aspart  0-6 Units Subcutaneous TID WC   midodrine  15 mg Oral  TID WC   polyethylene glycol  17 g Oral Daily   pregabalin  75 mg Oral BID   senna-docusate  1 tablet Oral BID   PRN Meds: acetaminophen **OR** acetaminophen, albuterol, ALPRAZolam, diphenhydrAMINE, hydrocortisone cream, ondansetron **OR** ondansetron (ZOFRAN) IV, oxyCODONE, traZODone Continuous Infusions:     LOS: 9 days  Time spent: Greater than 50% of the 35 minute visit was spent in counseling/coordination of care for the patient as laid out in the A&P.   Desma Maxim, MD [facility] 08/04/2020, 12:38 PM

## 2020-08-04 NOTE — Progress Notes (Signed)
Physical Therapy Treatment and Discharge from Acute PT Patient Details Name: [name] [name] MRN: [id number] DOB: 1955/02/27 Today's Date: 08/04/2020    History of Present Illness [name] [name] is a 65yo female whot presents with elevated potassium. PMH: diabetes mellitus type 2, diabetic neuropathy, HTN, chronic diastolic CHF, chronic anemia, chronic kidney disease stage III, morbid obesity, hemorrhagic shock secondary to GI bleeding requiring embolization, OSA on CPAP, anxiety and depression    PT Comments    Pt familiar to this PT from previous admissions last year.  Pt has been basically bedbound since at least August/September 2021 after sustaining left LE fracture, per imaging 10/2019: "Comminuted, impacted intra-articular fracture involving the lateral femoral condyle and intercondylar region. Some periosteal new bone formation, suggesting subacute process."  Pt admitted approximately 6 months ago and was mostly bedbound at that time, using lift for OOB, and total care requiring at least max +2 assist for bed mobility from PT notes during that admission.  Pt remains total assist and total care today.  Pt reports caregivers at home and using mechanical lift for OOB (if OOB). Pt appears at her baseline currently, so will sign off at this time.     Follow Up Recommendations  No PT follow up;Supervision/Assistance - 24 hour     Equipment Recommendations  None recommended by PT    Recommendations for Other Services       Precautions / Restrictions Precautions Precautions: Fall    Mobility  Bed Mobility Overal bed mobility: Needs Assistance Bed Mobility: Rolling Rolling: Total assist         General bed mobility comments: pt unable to pull her trunk off bed when gripping bed rails, pt was able to reach to headboard and self assist with moving up in bed when in trendelenberg position, unable to complete reaching across body to assist with rolling    Transfers                  General transfer comment: pt reports using hoyer at home  Ambulation/Gait                 Stairs             Wheelchair Mobility    Modified Rankin (Stroke Patients Only)       Balance                                            Cognition Arousal/Alertness: Awake/alert Behavior During Therapy: WFL for tasks assessed/performed Overall Cognitive Status: Within Functional Limits for tasks assessed                                 General Comments: Pt attentive, following commands appropriately; however appears to have poor insight into abilities (talking about wearing high heels eventually however has not stood on feet in over a year)      Exercises General Exercises - Lower Extremity Ankle Circles/Pumps: AROM;Both;10 reps Heel Slides: AAROM;Both;5 reps;Limitations Heel Slides Limitations: <25% AROM, required assist, also required hip external rotation bilaterally    General Comments        Pertinent Vitals/Pain Pain Assessment: Faces Faces Pain Scale: Hurts even more Pain Location: generalized with LE movement Pain Descriptors / Indicators: Discomfort;Grimacing;Guarding Pain Intervention(s): Repositioned;Monitored during session    Home Living  Prior Function            PT Goals (current goals can now be found in the care plan section) Progress towards PT goals: Not progressing toward goals - comment (pt appears at her baseline, bedbound, total care)    Frequency    Min 2X/week      PT Plan Other (comment) (d/c from acute PT)    Co-evaluation              AM-PAC PT "6 Clicks" Mobility   Outcome Measure  Help needed turning from your back to your side while in a flat bed without using bedrails?: Total Help needed moving from lying on your back to sitting on the side of a flat bed without using bedrails?: Total Help needed moving to and from a bed to a chair  (including a wheelchair)?: Total Help needed standing up from a chair using your arms (e.g., wheelchair or bedside chair)?: Total Help needed to walk in hospital room?: Total Help needed climbing 3-5 steps with a railing? : Total 6 Click Score: 6    End of Session   Activity Tolerance: Patient tolerated treatment well Patient left: in bed;with call bell/phone within reach;with bed alarm set;with nursing/sitter in room   PT Visit Diagnosis: Other abnormalities of gait and mobility (R26.89);Muscle weakness (generalized) (M62.81)     Time: 2248-2500 PT Time Calculation (min) (ACUTE ONLY): 17 min  Charges:  $Therapeutic Activity: 8-22 mins                    Jannette Spanner PT, DPT Acute Rehabilitation Services Pager: [phone] Office: [phone]  York Ram E 08/04/2020, 4:32 PM

## 2020-08-04 NOTE — TOC Progression Note (Signed)
Transition of Care [facility]) - Progression Note    Patient Details  Name: [name] [name] MRN: [id number] Date of Birth: Mar 22, 1955  Transition of Care [facility]) CM/SW Contact  [name], RN Phone Number: 08/04/2020, 3:59 PM  Clinical Narrative:    Pt continue to state that she will go home with [facility] and PCS from the [place], when she is stable for discharge.    Expected Discharge Plan: [place] Barriers to Discharge: No Barriers Identified  Expected Discharge Plan and Services Expected Discharge Plan: [place] arrangements for the past 2 months: Hotel/Motel                                       Social Determinants of Health (SDOH) Interventions    Readmission Risk Interventions Readmission Risk Prevention Plan 02/06/2020 11/08/2019  Transportation Screening Complete Complete  Medication Review [employer]) - Referral to Pharmacy  PCP or Specialist appointment within 3-5 days of discharge Complete Complete  HRI or [place] - Complete  SW Recovery Care/Counseling Consult Complete Complete  Palliative Care Screening Not Applicable Not Applicable  Skilled Nursing Facility Complete Complete  Some recent data might be hidden

## 2020-08-05 LAB — BASIC METABOLIC PANEL
Anion gap: 9 (ref 5–15)
BUN: 87 mg/dL — ABNORMAL HIGH (ref 8–23)
CO2: 33 mmol/L — ABNORMAL HIGH (ref 22–32)
Calcium: 10.4 mg/dL — ABNORMAL HIGH (ref 8.9–10.3)
Chloride: 97 mmol/L — ABNORMAL LOW (ref 98–111)
Creatinine, Ser: 2.12 mg/dL — ABNORMAL HIGH (ref 0.44–1.00)
GFR, Estimated: 25 mL/min — ABNORMAL LOW (ref >60)
Glucose, Bld: 92 mg/dL (ref 70–99)
Potassium: 5.7 mmol/L — ABNORMAL HIGH (ref 3.5–5.1)
Sodium: 139 mmol/L (ref 135–145)

## 2020-08-05 LAB — BLOOD GAS, ARTERIAL
Acid-Base Excess: 7.9 mmol/L — ABNORMAL HIGH (ref 0.0–2.0)
Acid-Base Excess: 7.3 mmol/L — ABNORMAL HIGH (ref 0.0–2.0)
Bicarbonate: 33.8 mmol/L — ABNORMAL HIGH (ref 20.0–28.0)
Bicarbonate: 32.9 mmol/L — ABNORMAL HIGH (ref 20.0–28.0)
FIO2: 21
O2 Saturation: 87 %
O2 Saturation: 86.9 %
Patient temperature: 98.6
Patient temperature: 98.6
pCO2 arterial: 56.1 mmHg — ABNORMAL HIGH (ref 32.0–48.0)
pCO2 arterial: 53.6 mmHg — ABNORMAL HIGH (ref 32.0–48.0)
pH, Arterial: 7.397 (ref 7.350–7.450)
pH, Arterial: 7.405 (ref 7.350–7.450)
pO2, Arterial: 54.3 mmHg — ABNORMAL LOW (ref 83.0–108.0)
pO2, Arterial: 53.7 mmHg — ABNORMAL LOW (ref 83.0–108.0)

## 2020-08-05 LAB — CBC WITH DIFFERENTIAL/PLATELET
Abs Immature Granulocytes: 0.05 10*3/uL (ref 0.00–0.07)
Basophils Absolute: 0.1 10*3/uL (ref 0.0–0.1)
Basophils Relative: 1 %
Eosinophils Absolute: 0.5 10*3/uL (ref 0.0–0.5)
Eosinophils Relative: 6 %
HCT: 34.4 % — ABNORMAL LOW (ref 36.0–46.0)
Hemoglobin: 10.6 g/dL — ABNORMAL LOW (ref 12.0–15.0)
Immature Granulocytes: 1 %
Lymphocytes Relative: 28 %
Lymphs Abs: 2.3 10*3/uL (ref 0.7–4.0)
MCH: 27.9 pg (ref 26.0–34.0)
MCHC: 30.8 g/dL (ref 30.0–36.0)
MCV: 90.5 fL (ref 80.0–100.0)
Monocytes Absolute: 1.1 10*3/uL — ABNORMAL HIGH (ref 0.1–1.0)
Monocytes Relative: 13 %
Neutro Abs: 4.3 10*3/uL (ref 1.7–7.7)
Neutrophils Relative %: 51 %
Platelets: 470 10*3/uL — ABNORMAL HIGH (ref 150–400)
RBC: 3.8 MIL/uL — ABNORMAL LOW (ref 3.87–5.11)
RDW: 16.8 % — ABNORMAL HIGH (ref 11.5–15.5)
WBC: 8.2 10*3/uL (ref 4.0–10.5)
nRBC: 0 % (ref 0.0–0.2)

## 2020-08-05 LAB — GLUCOSE, CAPILLARY
Glucose-Capillary: 85 mg/dL (ref 70–99)
Glucose-Capillary: 74 mg/dL (ref 70–99)
Glucose-Capillary: 90 mg/dL (ref 70–99)
Glucose-Capillary: 123 mg/dL — ABNORMAL HIGH (ref 70–99)
Glucose-Capillary: 152 mg/dL — ABNORMAL HIGH (ref 70–99)

## 2020-08-05 LAB — MAGNESIUM: Magnesium: 2.5 mg/dL — ABNORMAL HIGH (ref 1.7–2.4)

## 2020-08-05 MED ORDER — FUROSEMIDE 40 MG PO TABS
80.0000 mg | ORAL_TABLET | Freq: Two times a day (BID) | ORAL | Status: DC
  Administered 2020-08-05 – 2020-08-06 (×3): 80 mg via ORAL
  Filled 2020-08-05 (×3): qty 2

## 2020-08-05 MED ORDER — SODIUM ZIRCONIUM CYCLOSILICATE 10 G PO PACK
10.0000 g | PACK | Freq: Two times a day (BID) | ORAL | Status: AC
  Administered 2020-08-05 – 2020-08-06 (×3): 10 g via ORAL
  Filled 2020-08-05 (×2): qty 1

## 2020-08-05 NOTE — Progress Notes (Signed)
Two RTs attempted x2 each for abg, but unsuccessful.  RN aware.

## 2020-08-05 NOTE — Progress Notes (Addendum)
[name] [name]  Assessment/ Plan: Pt is a 65 y.o. yo female with morbid obesity, bedbound, DM, HTN, neuropathy, chronic diastolic CHF, anemia, CKD stage III, OSA on CPAP, anxiety depression who was sent by her PCP to ER for abnormal lab result, seen as a consultation for AKI on CKD and hyperkalemia.  Problems:  Acute kidney injury on CKDIIIa: b/l cr 1.3-1.5, due to volume overload and frequent hypotensive episode causing reduced renal perfusion.  Urinalysis bland.  Kidney ultrasound limited d/t body habitus. IV lasix started but BP's dropped so midodrine was started and ^'d to 15 tid.  Creat here about 2.1- 2.4, went up w/ diuresis which is expected for 3rd spaced fluid situation.  She is not a HD candidate d/t bedbound state. With IV lasix 40- 80 bid we removed significant amts of fluid and LE's are less edematous. Serum CO2 and K+ are rising so I believe we have done all we can w/ IV lasix and will dc today and switch to po lasix 80 bid. I have d/w Dr [place] Qua at [facility] and gave her my recommendations for renal referral which she will take care of there. OK for dc from renal standpoint. Will sign off.  Hyperkalemia: should ACEi/ ARB's should be avoided indefinitely in this pt w/ suspected cor pulmonale and sig CKD. Try to get low K+ diet education prior to dc. If K+ still high upon dc would put her on lokelma 10 gm qod for 3 or 4 doses.  Vol overload - R sided volume overload w/o any evidence of pulm edema/ resp issues. In severely morbid obese pt this looks the most like R HF (even though not seen on echo).  Anemia: Received a unit of blood transfusion on 6/5.  Iron saturation 11% and serum iron 42.  I ordered IV iron.  Monitor hemoglobin.  [name] Splinter, [name] [name] 08/05/2020, 9:59 AM      Subjective:  fluid in legs is down and feeling better  Objective Vital signs in last 24 hours: Vitals:   08/04/20 0629 08/04/20 2015 08/05/20 0324 08/05/20 0500  BP: (!) 124/48 (!)  122/49 (!) 100/47   Pulse: 73 78 71   Resp: 17 18 19    Temp: (!) 97.5 F (36.4 C) 97.7 F (36.5 C) 98.1 F (36.7 C)   TempSrc: Oral Oral Axillary   SpO2: 100% 93% 95%   Weight:    (!) 145.2 kg  Height:       Weight change:   Intake/Output Summary (Last 24 hours) at 08/05/2020 0959 Last data filed at 08/04/2020 1809 Gross per 24 hour  Intake --  Output 450 ml  Net -450 ml        Labs: Basic Metabolic Panel: Recent Labs  Lab 08/01/20 0434 08/02/20 0441 08/03/20 0431 08/04/20 0403 08/04/20 1309 08/05/20 0428  NA 139 140 139 141 139 139  K 4.8 5.0 4.9 5.5* 5.3* 5.7*  CL 102 103 100 96* 95* 97*  CO2 27 27 30  33* 34* 33*  GLUCOSE 80 84 94 120* 115* 92  BUN 79* 79* 79* 80* 82* 87*  CREATININE 2.14* 2.12* 2.27* 1.90* 1.84* 2.12*  CALCIUM 9.5 10.0 10.0 11.0* 10.8* 10.4*  PHOS 5.2* 5.1* 5.3*  --   --   --     Liver Function Tests: Recent Labs  Lab 08/01/20 0434 08/02/20 0441 08/03/20 0431  ALBUMIN 3.5 4.0 3.7    No results for input(s): LIPASE, AMYLASE in the last 168 hours. No results  for input(s): AMMONIA in the last 168 hours. CBC: Recent Labs  Lab 08/02/20 0441 08/03/20 0431 08/04/20 0403 08/05/20 0428  WBC 7.3 7.6 8.2 8.2  NEUTROABS 4.0 4.1 5.0 4.3  HGB 10.0* 10.0* 11.5* 10.6*  HCT 33.0* 32.8* 37.9 34.4*  MCV 91.4 90.6 91.1 90.5  PLT 509* 491* 563* 470*    Cardiac Enzymes: No results for input(s): CKTOTAL, CKMB, CKMBINDEX, TROPONINI in the last 168 hours. CBG: Recent Labs  Lab 08/04/20 1146 08/04/20 1728 08/04/20 2011 08/05/20 0319 08/05/20 0806  GLUCAP 104* 100* 133* 85 74     Iron Studies:  No results for input(s): IRON, TIBC, TRANSFERRIN, FERRITIN in the last 72 hours.  Studies/Results: No results found.  Medications: Infusions:    Scheduled Medications:  busPIRone  5 mg Oral Q8H   DULoxetine  30 mg Oral BID   famotidine  20 mg Oral QHS   furosemide  60 mg Intravenous BID   heparin  5,000 Units Subcutaneous Q8H    insulin aspart  0-5 Units Subcutaneous QHS   insulin aspart  0-6 Units Subcutaneous TID WC   midodrine  15 mg Oral TID WC   polyethylene glycol  17 g Oral Daily   pregabalin  75 mg Oral BID   senna-docusate  1 tablet Oral BID   sodium zirconium cyclosilicate  10 g Oral BID    have reviewed scheduled and prn medications.  Physical Exam: General: Morbidly obese female with short neck, lying on bed comfortable Heart:RRR, s1s2 nl, no rubs Lungs: Distant breath sound. Abdomen:soft, obese Extremities: diffuse 1-2 + bilat LE edema from feet to thighs, not as tight Neurology: Alert, awake and oriented.  [name] [name] 08/05/2020,9:59 AM  LOS: 10 days

## 2020-08-05 NOTE — Significant Event (Signed)
Rapid Response Event Note   Reason for Call :  Decreased responsiveness, confusion not seen by this nurse in patient previously. Patient had not been wearing CPAP when RN called due to previously refusing.  Initial Focused Assessment:  Patient more alert and wearing CPAP on when I arrived. Patient able to state name/d.o.b., but got confused initially with current date/time as she kept repeating her d.o.b. After asking date/time in different way she was able to state correctly. Patient has equal grips bilaterally and follows all commands appropriately.    Interventions:  Entered standing order set for decreased responsiveness: EKG, ABG, CBC, BMP, CBG, IV placement. Advised bedside RN, Rosario Adie, to start getting these items done and call MD. Blood sugar is 85. EKG showed NSR. ABG completed. RT assisted in ensuring CPAP functioning correctly. On call hospital team saw patient at bedside. No other new interventions at this time.  Phlebotomist to draw labs. Plan of Care:  Continue to monitor patient and vital signs, maintain CPAP as long as patient able to tolerate, repeat ABG after few hours on CPAP. Follow up on lab results.    Event Summary:   MD Notified: by bedside RN Call Time: 0302 Arrival Time: 9030 End Time: [place], RN

## 2020-08-05 NOTE — Progress Notes (Signed)
Pt called Primary Care MD @ [facility], Dr. Kristopher Oppenheim, PCP at [facility] for pt number is [phone] ext [phone], asked her  page Dr. Jonnie Finner ASAP to discuss pt plan of care. SRP, RN

## 2020-08-05 NOTE — Progress Notes (Signed)
Patient ID: [name] [name], female   DOB: 09-22-1955, 65 y.o.   MRN: [id number]  PROGRESS NOTE    [name] [name]  XIP:[id number] DOB: [birth year] DOA: 07/25/2020 PCP: Pcp, No   Brief Narrative:  65 y.o. female with medical history significant of diabetes mellitus type 2, diabetic neuropathy, hypertension, chronic diastolic CHF, chronic anemia, chronic kidney disease stage III, morbid obesity, hemorrhagic shock secondary to GI bleeding requiring embolization, OSA on CPAP, anxiety and depression presented to the hospital with elevated potassium.  On presentation, potassium was 6.3 with creatinine of 1.25, hemoglobin of 7.7  with no EKG changes of hyperkalemia.  She was given a dose of Lokelma. Chest x-ray showed questionable right lower lobe infiltrate versus artifact.  She was started on Lasix and given IV sodium bicarbonate/calcium gluconate/regular insulin/dextrose/nebulized albuterol.  Repeat potassium was still high.  Nephrology was consulted.  Assessment & Plan:   Hyperkalemia: Intermittent -Most likely related to lisinopril use. -Lisinopril on hold. -Nephrology following.  Potassium 5.7 today.  Continue Lokelma.  Acute on chronic diastolic heart failure -Patient is morbidly obese as well and volume status assessment is difficult.    Echo showed EF of 65 to 70% - Lasix plan as below.  Strict input and output.  Daily weights.  Fluid restriction.   Acute on chronic kidney disease stage IIIa -Possibly from volume overload.   -Renal ultrasound was negative for hydronephrosis -Nephrology following.  Creatinine 2.12 today.  Lasix as per nephrology.  Patient will need outpatient nephrology follow-up.  She is not a good candidate for dialysis as per nephrology. -Patient also on midodrine as per nephrology.  Acute on chronic anemia of chronic disease -Possibly from kidney disease.  Status post 1 unit packed red cell transfusion during this hospitalization.  - No overt signs of  bleeding. -Hemoglobin currently stable.  Thrombocytosis -Possibly reactive.  Improved   Diabetes mellitus type 2 -CBGs with SSI. Carb modified diet.     Hypertension Hypotension -Monitor blood pressure.    Blood pressure on the lower side.  Midodrine added as per nephrology.   Anxiety and depression -Continue home regimen   Morbid obesity -Outpatient follow-up   Generalized conditioning -Patient declining SNF.  Wishes to return home at time of discharge. -Overall prognosis is guarded to poor.  Palliative care following.  Currently remains full code.   DVT prophylaxis: Heparin Code Status: Full Family Communication: None at bedside Disposition Plan: Status is: Inpatient because: Inpatient level of care appropriate due to severity of illness  Dispo: The patient is from: Home              Anticipated d/c is to: Home              Patient currently is not medically stable to d/c.   Difficult to place patient No   Consultants: Nephrology  Procedures: Echo as below  Antimicrobials: None   Subjective: Patient seen and examined at bedside.  Denies current chest pain, nausea, vomiting or worsening abdominal pain.  Objective: Vitals:   08/04/20 2015 08/05/20 0324 08/05/20 0500 08/05/20 1339  BP: (!) 122/49 (!) 100/47  (!) 130/49  Pulse: 78 71  87  Resp: 18 19  16   Temp: 97.7 F (36.5 C) 98.1 F (36.7 C)  98.4 F (36.9 C)  TempSrc: Oral Axillary  Oral  SpO2: 93% 95%  96%  Weight:   (!) 145.2 kg   Height:        Intake/Output Summary (Last 24 hours) at 08/05/2020 1405  Last data filed at 08/04/2020 1809 Gross per 24 hour  Intake --  Output 450 ml  Net -450 ml    Filed Weights   08/03/20 0500 08/04/20 0611 08/05/20 0500  Weight: (!) 145.3 kg (!) (P) 143.9 kg (!) 145.2 kg    Examination:  General exam: No acute distress.  Currently on room air.  Looks chronically ill. Respiratory system: Bilateral decreased breath sounds at bases with scattered crackles   cardiovascular system: S1-S2 heard, rate controlled  gastrointestinal system: Abdomen is morbidly obese, distended, soft and nontender.  Normal bowel sounds heard  extremities: Bilateral lower extremity edema present; no cyanosis Central nervous system: Alert and oriented.  No focal neurological deficits.  Moving extremities  skin: No obvious ecchymosis/lesions Psychiatry: Flat affect  Data Reviewed: I have personally reviewed following labs and imaging studies  CBC: Recent Labs  Lab 08/02/20 0441 08/03/20 0431 08/04/20 0403 08/05/20 0428  WBC 7.3 7.6 8.2 8.2  NEUTROABS 4.0 4.1 5.0 4.3  HGB 10.0* 10.0* 11.5* 10.6*  HCT 33.0* 32.8* 37.9 34.4*  MCV 91.4 90.6 91.1 90.5  PLT 509* 491* 563* 470*    Basic Metabolic Panel: Recent Labs  Lab 07/30/20 0428 07/31/20 0423 08/01/20 0434 08/02/20 0441 08/03/20 0431 08/04/20 0403 08/04/20 1309 08/05/20 0428  NA 140 140 139 140 139 141 139 139  K 4.8 5.5* 4.8 5.0 4.9 5.5* 5.3* 5.7*  CL 105 105 102 103 100 96* 95* 97*  CO2 24 22 27 27 30  33* 34* 33*  GLUCOSE 76 89 80 84 94 120* 115* 92  BUN 69* 78* 79* 79* 79* 80* 82* 87*  CREATININE 2.19* 2.41* 2.14* 2.12* 2.27* 1.90* 1.84* 2.12*  CALCIUM 9.2 9.6 9.5 10.0 10.0 11.0* 10.8* 10.4*  MG 1.5* 2.6* 2.3 2.3 2.1 2.6*  --  2.5*  PHOS 5.7* 5.8* 5.2* 5.1* 5.3*  --   --   --     GFR: Estimated Creatinine Clearance: 35.8 mL/min (A) (by C-G formula based on SCr of 2.12 mg/dL (H)). Liver Function Tests: Recent Labs  Lab 07/30/20 0428 07/31/20 0423 08/01/20 0434 08/02/20 0441 08/03/20 0431  ALBUMIN 3.3* 3.7 3.5 4.0 3.7    No results for input(s): LIPASE, AMYLASE in the last 168 hours. No results for input(s): AMMONIA in the last 168 hours. Coagulation Profile: No results for input(s): INR, PROTIME in the last 168 hours. Cardiac Enzymes: No results for input(s): CKTOTAL, CKMB, CKMBINDEX, TROPONINI in the last 168 hours. BNP (last 3 results) No results for input(s): PROBNP in the  last 8760 hours. HbA1C: No results for input(s): HGBA1C in the last 72 hours. CBG: Recent Labs  Lab 08/04/20 1728 08/04/20 2011 08/05/20 0319 08/05/20 0806 08/05/20 1134  GLUCAP 100* 133* 85 74 90    Lipid Profile: No results for input(s): CHOL, HDL, LDLCALC, TRIG, CHOLHDL, LDLDIRECT in the last 72 hours. Thyroid Function Tests: No results for input(s): TSH, T4TOTAL, FREET4, T3FREE, THYROIDAB in the last 72 hours. Anemia Panel: No results for input(s): VITAMINB12, FOLATE, FERRITIN, TIBC, IRON, RETICCTPCT in the last 72 hours. Sepsis Labs: No results for input(s): PROCALCITON, LATICACIDVEN in the last 168 hours.  Recent Results (from the past 240 hour(s))  Urine Culture     Status: Abnormal   Collection Time: 07/27/20  4:40 PM   Specimen: Urine, Random  Result Value Ref Range Status   Specimen Description   Final    URINE, RANDOM Performed at [facility] [address], [place],  [address]  Special Requests   Final    NONE Performed at [facility], [place] [address], [place], [place] [address]    Culture 50,000 COLONIES/mL KLEBSIELLA PNEUMONIAE (A)  Final   Report Status 07/30/2020 FINAL  Final   Organism ID, Bacteria KLEBSIELLA PNEUMONIAE (A)  Final      Susceptibility   Klebsiella pneumoniae - MIC*    AMPICILLIN >=32 RESISTANT Resistant     CEFAZOLIN <=4 SENSITIVE Sensitive     CEFEPIME <=0.12 SENSITIVE Sensitive     CEFTRIAXONE <=0.25 SENSITIVE Sensitive     CIPROFLOXACIN <=0.25 SENSITIVE Sensitive     GENTAMICIN <=1 SENSITIVE Sensitive     IMIPENEM <=0.25 SENSITIVE Sensitive     NITROFURANTOIN 64 INTERMEDIATE Intermediate     TRIMETH/SULFA <=20 SENSITIVE Sensitive     AMPICILLIN/SULBACTAM 4 SENSITIVE Sensitive     PIP/TAZO <=4 SENSITIVE Sensitive     * 50,000 COLONIES/mL KLEBSIELLA PNEUMONIAE         Radiology Studies: No results found.       Scheduled Meds:  busPIRone  5 mg Oral Q8H   DULoxetine   30 mg Oral BID   famotidine  20 mg Oral QHS   furosemide  80 mg Oral BID   heparin  5,000 Units Subcutaneous Q8H   insulin aspart  0-5 Units Subcutaneous QHS   insulin aspart  0-6 Units Subcutaneous TID WC   midodrine  15 mg Oral TID WC   polyethylene glycol  17 g Oral Daily   pregabalin  75 mg Oral BID   senna-docusate  1 tablet Oral BID   sodium zirconium cyclosilicate  10 g Oral BID   Continuous Infusions:         Aline August, MD [facility] 08/05/2020, 2:05 PM

## 2020-08-05 NOTE — Plan of Care (Signed)

## 2020-08-05 NOTE — TOC Progression Note (Signed)
Transition of Care [facility]) - Progression Note    Patient Details  Name: [name] [name] MRN: [id number] Date of Birth: 1955-05-03  Transition of [facility]) CM/SW Contact  [name], RN Phone Number: 08/05/2020, 12:26 PM  Clinical Narrative:    Pt's PCP at the [facility], is Dr. Lindwood Qua [phone] ext [phone].    Expected Discharge Plan: [place] Barriers to Discharge: No Barriers Identified  Expected Discharge Plan and Services Expected Discharge Plan: [place] arrangements for the past 2 months: Hotel/Motel                                       Social Determinants of Health (SDOH) Interventions    Readmission Risk Interventions Readmission Risk Prevention Plan 02/06/2020 11/08/2019  Transportation Screening Complete Complete  Medication Review [employer]) - Referral to Pharmacy  PCP or Specialist appointment within 3-5 days of discharge Complete Complete  HRI or [place] - Complete  SW Recovery Care/Counseling Consult Complete Complete  Palliative Care Screening Not Applicable Not Applicable  Skilled Nursing Facility Complete Complete  Some recent data might be hidden

## 2020-08-05 NOTE — Plan of Care (Signed)
  Problem: Education: Goal: Knowledge of General Education information will improve Description: Including pain rating scale, medication(s)/side effects and non-pharmacologic comfort measures 08/05/2020 1442 by Zadie Rhine, RN Outcome: Progressing 08/05/2020 1440 by Zadie Rhine, RN Outcome: Progressing   Problem: Health Behavior/Discharge Planning: Goal: Ability to manage health-related needs will improve 08/05/2020 1442 by Zadie Rhine, RN Outcome: Progressing 08/05/2020 1440 by Zadie Rhine, RN Outcome: Progressing   Problem: Clinical Measurements: Goal: Ability to maintain clinical measurements within normal limits will improve Outcome: Progressing

## 2020-08-05 NOTE — Consult Note (Signed)
Consultation Note Date: 08/05/2020   Patient Name: [name] [name]  DOB: 08-Oct-1955  MRN: [id number]  Age / Sex: 65 y.o., female  PCP: Pcp, No Referring Physician: Aline August, MD  Reason for Consultation: Establishing goals of care  HPI/Patient Profile: 65 y.o. female  with past medical history of diabetes, diabetic neuropathy, hypertension, chronic diastolic heart failure, anemia, chronic kidney disease stage III, morbid obesity, hemorrhagic shock secondary to GI bleed, OSA, anxiety, depression, bedbound admitted on 07/25/2020 with elevated potassium and renal failure.  She continues to have intermittent hyperkalemia and remains volume overloaded.  She has chronic renal impairment and nephrology has been working to diurese her while balancing kidney function.  Current plan is to continue Lasix until edema improves or her creatinine increases.  Overall concerned about her long-term prognosis, goals, and care situation at home.  Palliative consulted for goals of care.  Clinical Assessment and Goals of Care: I met today with with [name] [name].  She was groggy during encounter, but she did answer questions appropriately when asked multiple times.  I introduced palliative care as specialized medical care for people living with serious illness. It focuses on providing relief from the symptoms and stress of a serious illness. The goal is to improve quality of life for both the patient and the family.   We discussed clinical course as well as wishes moving forward in regard to advanced directives.  Concepts specific to code status, care plan this hospitalization, and rehospitalization discussed.  We discussed difference between a aggressive medical intervention path and a palliative, comfort focused care path.  Values and goals of care important to patient and family were attempted to be elicited.     SUMMARY OF  RECOMMENDATIONS   -Full code/full scope -She reports wanting to back to her home at time of discharge.  She minimizes concerns about her overall clinical status and ability to care for herself at home.  She is not open to consideration for SNF at time of discharge but there remains high concern about her ability to care for herself in her home environment. -Palliative care will continue to attempt to progress conversation regarding long-term goals of care over the next several days.  Code Status/Advance Care Planning: Full code  Psycho-social/Spiritual:  Desire for further Chaplaincy support:no Additional Recommendations: Caregiving  Support/Resources  Prognosis:  Guarded  Discharge Planning: To Be Determined      Primary Diagnoses: Present on Admission:  (Resolved) Hyperkalemia  Acute diastolic CHF (congestive heart failure) ([place])  Acute cystitis without hematuria   I have reviewed the medical record, interviewed the patient and family, and examined the patient. The following aspects are pertinent.  Past Medical History:  Diagnosis Date   Diabetes mellitus without complication (HCC)    Hypertension    Knee pain, chronic    Neuropathy in diabetes [facility])    Social History   Socioeconomic History   Marital status: Single    Spouse name: Not on file   Number of children: 1   Years of  education: 14   Highest education level: Not on file  Occupational History   Occupation: retired English as a second language teacher  Tobacco Use   Smoking status: Former    Pack years: 0.00   Smokeless tobacco: Never  Vaping Use   Vaping Use: Never used  Substance and Sexual Activity   Alcohol use: Yes    Alcohol/week: 0.0 standard drinks    Comment: socially   Drug use: No    Types: Marijuana   Sexual activity: Not on file  Other Topics Concern   Not on file  Social History Narrative   Lives at home with a friend   Drinks coffee occasionally and some tea   Social Determinants of Health   Financial  Resource Strain: Not on file  Food Insecurity: Not on file  Transportation Needs: Not on file  Physical Activity: Not on file  Stress: Not on file  Social Connections: Not on file   Family History  Problem Relation Age of Onset   Diabetes Mother    Scheduled Meds:  busPIRone  5 mg Oral Q8H   DULoxetine  30 mg Oral BID   famotidine  20 mg Oral QHS   furosemide  80 mg Oral BID   heparin  5,000 Units Subcutaneous Q8H   insulin aspart  0-5 Units Subcutaneous QHS   insulin aspart  0-6 Units Subcutaneous TID WC   midodrine  15 mg Oral TID WC   polyethylene glycol  17 g Oral Daily   pregabalin  75 mg Oral BID   senna-docusate  1 tablet Oral BID   sodium zirconium cyclosilicate  10 g Oral BID   Continuous Infusions: PRN Meds:.acetaminophen **OR** acetaminophen, albuterol, ALPRAZolam, diphenhydrAMINE, hydrocortisone cream, ondansetron **OR** ondansetron (ZOFRAN) IV, oxyCODONE, traZODone Medications Prior to Admission:  Prior to Admission medications   Medication Sig Start Date End Date Taking? Authorizing Provider  allopurinol (ZYLOPRIM) 100 MG tablet Take 100 mg by mouth 2 (two) times daily.   Yes [provider]  ALPRAZolam (XANAX) 0.5 MG tablet Take 1 tablet (0.5 mg total) by mouth 3 (three) times daily as needed for anxiety. 07/16/20  Yes Loni Beckwith, PA-C  atorvastatin (LIPITOR) 10 MG tablet Take 10 mg by mouth daily.   Yes [provider]  famotidine (PEPCID) 20 MG tablet Take 20 mg by mouth at bedtime. 01/18/20  Yes [provider]  furosemide (LASIX) 20 MG tablet Take 1 tablet (20 mg total) by mouth daily. 02/11/20 02/10/21 Yes Oswald Hillock, MD  gabapentin (NEURONTIN) 600 MG tablet Take 1 tablet (600 mg total) by mouth 2 (two) times daily. Patient taking differently: Take 600 mg by mouth every 8 (eight) hours. 11/21/19  Yes Patrecia Pour, MD  pantoprazole (PROTONIX) 40 MG tablet Take 1 tablet (40 mg total) by mouth 2 (two) times daily. 11/21/19  12/21/19 Yes Patrecia Pour, MD  pioglitazone (ACTOS) 30 MG tablet Take 30 mg by mouth daily.   Yes [provider]  senna-docusate (SENOKOT-S) 8.6-50 MG tablet Take 1 tablet by mouth daily. Started 11/24/2019   Yes [provider]  sitaGLIPtin (JANUVIA) 100 MG tablet Take 100 mg by mouth daily.   Yes [provider]  traZODone (DESYREL) 50 MG tablet Take 1 tablet by mouth at bedtime. 01/26/20  Yes [provider]   Allergies  Allergen Reactions   Morphine Itching and Rash   Penicillins Itching, Nausea And Vomiting, Rash and Hives    Has patient had a PCN reaction causing immediate rash,  facial/tongue/throat swelling, SOB or lightheadedness with hypotension: No Has patient had a PCN reaction causing severe rash involving mucus membranes or skin necrosis: No Has patient had a PCN reaction that required hospitalization: No Has patient had a PCN reaction occurring within the last 10 years: No If all of the above answers are "NO", then may proceed with Cephalosporin use.  Has patient had a PCN reaction causing immediate rash, facial/tongue/throat swelling, SOB or lightheadedness with hypotension: No Has patient had a PCN reaction causing severe rash involving mucus membranes or skin necrosis: No Has patient had a PCN reaction that required hospitalization: No Has patient had a PCN reaction occurring within the last 10 years: No If all of the above answers are "NO", then may proceed with Cephalosporin use.    Lisinopril Swelling   Review of Systems Endorses being sleepy.  Denies all other complaints.  Physical Exam General: Groggy, in no acute distress.   HEENT: No bruits, no goiter, no JVD Heart: Regular rate and rhythm. No murmur appreciated. Lungs: Distant breath sounds, fair air movement, clear Abdomen: Soft, nontender, nondistended, positive bowel sounds.   Ext: + edema Skin: Warm and dry  Vital Signs: BP (!) 100/47 (BP Location: Left Wrist)    Pulse 71   Temp 98.1 F (36.7 C) (Axillary)   Resp 19   Ht 5' 2" (1.575 m)   Wt (!) 145.2 kg   SpO2 95%   BMI 58.55 kg/m  Pain Scale: 0-10 POSS *See Group Information*: S-Acceptable,Sleep, easy to arouse Pain Score: 0-No pain   SpO2: SpO2: 95 % O2 Device:SpO2: 95 % O2 Flow Rate: .   IO: Intake/output summary:  Intake/Output Summary (Last 24 hours) at 08/05/2020 1132 Last data filed at 08/04/2020 1809 Gross per 24 hour  Intake --  Output 450 ml  Net -450 ml    LBM: Last BM Date: 08/04/20 Baseline Weight: Weight: (!) 136.5 kg Most recent weight: Weight: (!) 145.2 kg     Palliative Assessment/Data:   Flowsheet Rows    Flowsheet Row Most Recent Value  Intake Tab   Referral Department Hospitalist  Unit at Time of Referral Med/Surg Unit  Palliative Care Primary Diagnosis Nephrology  Date Notified 08/02/20  Palliative Care Type New Palliative care  Reason for referral Clarify Goals of Care  Date of Admission 07/25/20  Date first seen by Palliative Care 08/04/20  # of days Palliative referral response time 2 Day(s)  # of days IP prior to Palliative referral 8  Clinical Assessment   Palliative Performance Scale Score 30%  Psychosocial & Spiritual Assessment   Palliative Care Outcomes   Patient/Family meeting held? Yes  Who was at the meeting? patient  Palliative Care Outcomes Clarified goals of care       Time In: 1750  Time Out: 1850 Time Total: 60 Greater than 50%  of this time was spent counseling and coordinating care related to the above assessment and plan.  Signed by: Micheline Rough, MD   Please contact Palliative Medicine Team phone at [phone] for questions and concerns.  For individual provider: See Shea Evans

## 2020-08-05 NOTE — Progress Notes (Signed)
Palliative brief note  Met with patient on 6/14 for initial encounter.  She was groggy but able to participate in conversation.    - Full code - Would not want dialysis but open to all other interventions. - continues to report goal of being at home.  Refuses to consider SNF. - PMT will continue to follow and progress conversation on long term goals - Full note to follow.  Elise Knobloch [facility] Pall[facility] are [phone]

## 2020-08-05 NOTE — Progress Notes (Signed)
Daily Progress Note   Patient Name: [name] [name]       Date: 08/05/2020 DOB: 1955-03-05  Age: 65 y.o. MRN#: [id number] Attending Physician: Aline August, MD Primary Care Physician: Pcp, No Admit Date: 07/25/2020  Reason for Consultation/Follow-up: Establishing goals of care  Subjective: I saw and examined [name] [name] today.  She was lying in bed in no distress.  We reviewed conversation from yesterday and also discussed conversation she had with Dr. Jonnie Finner earlier today.  We specifically discussed volume overload and difficult situation with her kidney function.  At this point, she expresses that she is set in her plan to transition home.  She continues to decline consideration for SNF.  I spoke with Dr. Jonnie Finner and he has spoken with her PCP at the [facility] regarding renal follow-up.  I did also recommend outpatient palliative care to follow and she seemed agreeable to this today.  Length of Stay: 10  Current Medications: Scheduled Meds:  . busPIRone  5 mg Oral Q8H  . DULoxetine  30 mg Oral BID  . famotidine  20 mg Oral QHS  . furosemide  80 mg Oral BID  . heparin  5,000 Units Subcutaneous Q8H  . insulin aspart  0-5 Units Subcutaneous QHS  . insulin aspart  0-6 Units Subcutaneous TID WC  . midodrine  15 mg Oral TID WC  . polyethylene glycol  17 g Oral Daily  . pregabalin  75 mg Oral BID  . senna-docusate  1 tablet Oral BID  . sodium zirconium cyclosilicate  10 g Oral BID    Continuous Infusions:   PRN Meds: acetaminophen **OR** acetaminophen, albuterol, ALPRAZolam, diphenhydrAMINE, hydrocortisone cream, ondansetron **OR** ondansetron (ZOFRAN) IV, oxyCODONE, traZODone  Physical Exam     General: Groggy, in no acute distress.   HEENT: No bruits, no goiter, no JVD Heart:  Regular rate and rhythm. No murmur appreciated. Lungs: Distant breath sounds, fair air movement, clear Abdomen: Soft, nontender, nondistended, positive bowel sounds.   Ext: + edema Skin: Warm and dry      Vital Signs: BP (!) 130/49 (BP Location: Right Arm)   Pulse 87   Temp 98.4 F (36.9 C) (Oral)   Resp 16   Ht 5\' 2"  (1.575 m)   Wt (!) 145.2 kg   SpO2 96%   BMI 58.55 kg/m  SpO2: SpO2: 96 % O2 Device: O2 Device: CPAP O2 Flow Rate:    Intake/output summary:  Intake/Output Summary (Last 24 hours) at 08/05/2020 1959 Last data filed at 08/05/2020 1430 Gross per 24 hour  Intake --  Output 675 ml  Net -675 ml   LBM: Last BM Date: 08/05/20 Baseline Weight: Weight: (!) 136.5 kg Most recent weight: Weight: (!) 145.2 kg       Palliative Assessment/Data:    Flowsheet Rows    Flowsheet Row Most Recent Value  Intake Tab   Referral Department Hospitalist  Unit at Time of Referral Med/Surg Unit  Palliative Care Primary Diagnosis Nephrology  Date Notified 08/02/20  Palliative Care Type New Palliative care  Reason for referral Clarify Goals of Care  Date of Admission 07/25/20  Date first seen by Palliative Care 08/04/20  # of days Palliative referral response time 2 Day(s)  # of days IP prior to Palliative referral 8  Clinical Assessment   Palliative Performance Scale Score 30%  Psychosocial & Spiritual Assessment   Palliative Care Outcomes   Patient/Family meeting held? Yes  Who was at the meeting? patient  Palliative Care Outcomes Clarified goals of care       Patient Active Problem List   Diagnosis Date Noted  . Acute renal failure superimposed on stage 3a chronic kidney disease ([place]) 07/30/2020  . Lower extremity cellulitis 02/04/2020  . Acute diastolic CHF (congestive heart failure) ([place]) 11/23/2019  . Vaginal discharge 11/06/2019  . Morbid obesity ([place]) 08/22/2019  . Opiate overdose ([place]) 08/20/2019  . Cellulitis 10/05/2018  . Acute cystitis without hematuria  10/05/2018  . Essential hypertension 10/05/2018  . Pressure injury of skin 10/05/2018  . Hypoglycemia secondary to sulfonylurea 11/21/2016  . Substance induced mood disorder ([place]) 07/02/2014  . Encephalopathy   . Hypokalemia   . Type 2 diabetes mellitus with stage 3 chronic kidney disease ([place])   . HLD (hyperlipidemia)   . Depression   . Anxiety state     Palliative Care Assessment & Plan   Patient Profile: 65 y.o. female  with past medical history of diabetes, diabetic neuropathy, hypertension, chronic diastolic heart failure, anemia, chronic kidney disease stage III, morbid obesity, hemorrhagic shock secondary to GI bleed, OSA, anxiety, depression, bedbound admitted on 07/25/2020 with elevated potassium and renal failure.  She continues to have intermittent hyperkalemia and remains volume overloaded.  She has chronic renal impairment and nephrology has been working to diurese her while balancing kidney function.  Current plan is to continue Lasix until edema improves or her creatinine increases.  Overall concerned about her long-term prognosis, goals, and care situation at home.  Palliative consulted for goals of care.  Recommendations/Plan: Full code/full scope Discussed concern about her overall trajectory with multiple comorbidities including volume overload and impaired renal function.  She continues to minimize concerns about her overall status and ability to care for herself at home.  She is set on discharging home and is not interested in skilled facility for rehab. I would recommend outpatient palliative care follow-up with her when she discharges home.  Goals of Care and Additional Recommendations: Limitations on Scope of Treatment: Full Scope Treatment  Code Status:    Code Status Orders  (From admission, onward)           Start     Ordered   07/25/20 1646  Full code  Continuous        07/25/20 1649           Code  Status History     Date Active Date Inactive  Code Status Order ID Comments User Context   02/04/2020 0308 02/12/2020 0303 Full Code [id number]  Rise Patience, MD Inpatient   11/06/2019 0028 11/23/2019 2051 Full Code [id number]  Mikki [place], MD ED   10/19/2019 0620 10/20/2019 2247 Full Code [id number]  Rise Patience, MD ED   08/20/2019 1754 08/24/2019 0604 Full Code [id number]  Marty Heck, DO ED   10/05/2018 0743 10/09/2018 1818 Full Code [id number]  Rise Patience, MD Inpatient   11/21/2016 2330 11/24/2016 1656 Full Code [id number]  Etta Quill, DO ED   06/28/2014 1508 07/02/2014 1725 Full Code [id number]  Chesley Mires, MD ED       Prognosis: Guarded  Discharge Planning: Home with [place] was discussed with patient, nephrology  Thank you for allowing the Palliative Medicine Team to assist in the care of this patient.   Time In: 1500 Time Out: 1530 Total Time 30 Prolonged Time Billed No      Greater than 50%  of this time was spent counseling and coordinating care related to the above assessment and plan.  Micheline Rough, MD  Please contact Palliative Medicine Team phone at [phone] for questions and concerns.

## 2020-08-06 DIAGNOSIS — N1831 Chronic kidney disease, stage 3a: Secondary | ICD-10-CM | POA: Diagnosis not present

## 2020-08-06 DIAGNOSIS — I5033 Acute on chronic diastolic (congestive) heart failure: Secondary | ICD-10-CM | POA: Diagnosis not present

## 2020-08-06 DIAGNOSIS — E875 Hyperkalemia: Secondary | ICD-10-CM | POA: Diagnosis not present

## 2020-08-06 DIAGNOSIS — N179 Acute kidney failure, unspecified: Secondary | ICD-10-CM | POA: Diagnosis not present

## 2020-08-06 LAB — BASIC METABOLIC PANEL
Anion gap: 10 (ref 5–15)
BUN: 89 mg/dL — ABNORMAL HIGH (ref 8–23)
CO2: 35 mmol/L — ABNORMAL HIGH (ref 22–32)
Calcium: 10.2 mg/dL (ref 8.9–10.3)
Chloride: 95 mmol/L — ABNORMAL LOW (ref 98–111)
Creatinine, Ser: 2.55 mg/dL — ABNORMAL HIGH (ref 0.44–1.00)
GFR, Estimated: 20 mL/min — ABNORMAL LOW (ref >60)
Glucose, Bld: 126 mg/dL — ABNORMAL HIGH (ref 70–99)
Potassium: 5.2 mmol/L — ABNORMAL HIGH (ref 3.5–5.1)
Sodium: 140 mmol/L (ref 135–145)

## 2020-08-06 LAB — CBC WITH DIFFERENTIAL/PLATELET
Abs Immature Granulocytes: 0.07 10*3/uL (ref 0.00–0.07)
Basophils Absolute: 0.1 10*3/uL (ref 0.0–0.1)
Basophils Relative: 1 %
Eosinophils Absolute: 0.5 10*3/uL (ref 0.0–0.5)
Eosinophils Relative: 6 %
HCT: 34.2 % — ABNORMAL LOW (ref 36.0–46.0)
Hemoglobin: 10.4 g/dL — ABNORMAL LOW (ref 12.0–15.0)
Immature Granulocytes: 1 %
Lymphocytes Relative: 26 %
Lymphs Abs: 2.2 10*3/uL (ref 0.7–4.0)
MCH: 28 pg (ref 26.0–34.0)
MCHC: 30.4 g/dL (ref 30.0–36.0)
MCV: 91.9 fL (ref 80.0–100.0)
Monocytes Absolute: 0.9 10*3/uL (ref 0.1–1.0)
Monocytes Relative: 10 %
Neutro Abs: 4.6 10*3/uL (ref 1.7–7.7)
Neutrophils Relative %: 56 %
Platelets: 460 10*3/uL — ABNORMAL HIGH (ref 150–400)
RBC: 3.72 MIL/uL — ABNORMAL LOW (ref 3.87–5.11)
RDW: 16.8 % — ABNORMAL HIGH (ref 11.5–15.5)
WBC: 8.2 10*3/uL (ref 4.0–10.5)
nRBC: 0.2 % (ref 0.0–0.2)

## 2020-08-06 LAB — MAGNESIUM: Magnesium: 2.8 mg/dL — ABNORMAL HIGH (ref 1.7–2.4)

## 2020-08-06 LAB — GLUCOSE, CAPILLARY
Glucose-Capillary: 117 mg/dL — ABNORMAL HIGH (ref 70–99)
Glucose-Capillary: 125 mg/dL — ABNORMAL HIGH (ref 70–99)
Glucose-Capillary: 130 mg/dL — ABNORMAL HIGH (ref 70–99)

## 2020-08-06 LAB — BLOOD GAS, VENOUS
Acid-Base Excess: 9.5 mmol/L — ABNORMAL HIGH (ref 0.0–2.0)
Bicarbonate: 37.6 mmol/L — ABNORMAL HIGH (ref 20.0–28.0)
O2 Saturation: 78.5 %
Patient temperature: 98.6
pCO2, Ven: 76.9 mmHg (ref 44.0–60.0)
pH, Ven: 7.31 (ref 7.250–7.430)
pO2, Ven: 47.8 mmHg — ABNORMAL HIGH (ref 32.0–45.0)

## 2020-08-06 MED ORDER — MIDODRINE HCL 5 MG PO TABS: 15.0000 mg | ORAL_TABLET | Freq: Three times a day (TID) | ORAL | 2 refills | Status: DC

## 2020-08-06 MED ORDER — LOKELMA 10 G PO PACK: 10.0000 g | PACK | Freq: Two times a day (BID) | ORAL | 0 refills | Status: AC

## 2020-08-06 MED ORDER — ALBUTEROL SULFATE HFA 108 (90 BASE) MCG/ACT IN AERS: 2.0000 | INHALATION_SPRAY | Freq: Four times a day (QID) | RESPIRATORY_TRACT | 0 refills | Status: DC | PRN

## 2020-08-06 MED ORDER — POLYETHYLENE GLYCOL 3350 17 G PO PACK: 17.0000 g | PACK | Freq: Every day | ORAL | 0 refills | Status: DC | PRN

## 2020-08-06 MED ORDER — FUROSEMIDE 80 MG PO TABS: 80.0000 mg | ORAL_TABLET | Freq: Two times a day (BID) | ORAL | 0 refills | Status: DC

## 2020-08-06 NOTE — Progress Notes (Signed)
Pt was stable upon [facility] picking patient up for discharge to home. Pt was given discharge instructions during day shift. Pt's son was called when [facility] came to pick patient up to let him know she was leaving the hospital to head home.

## 2020-08-06 NOTE — Progress Notes (Signed)
Pt alert and oriented x4, did not remember anything happening this am. Pt sttes reeady to go home. VSs O1 sat on RA 90-91%. Will update MD. SRP, RN

## 2020-08-06 NOTE — Progress Notes (Signed)
Talking  with pt; want to return home. Pt sister updated and made aware of pt discharge. Pt son also aware of discharge plans. Case management plan to arrange for pt to be transported by EMS or [facility]. Patient and family aware.

## 2020-08-06 NOTE — Progress Notes (Addendum)
Pt lethargic this am, pt difficult to arouse. CBG 117, pt open eye to verbal and tactile response. Slow to respond. Pt has garble speech, then re-focus and unable to re-state or re-call conversations. MD rounding and made aware of status. Son called and updated. Pt on O2 @ 2 liters with continuous BS monitor. Will continue to monitor. SRP, RN

## 2020-08-06 NOTE — Discharge Summary (Signed)
Physician Discharge Summary  [name] [name] SJG:[id number] DOB: 05/21/1955 DOA: 07/25/2020  PCP: Merryl Hacker, No  Admit date: 07/25/2020 Discharge date: 08/06/2020  Admitted From: Home Disposition: Home.  Refused SNF placement.  Recommendations for Outpatient Follow-up:  Follow up with PCP in 1 week with repeat CBC/BMP Outpatient follow-up with nephrology at earliest convenience Recommend outpatient evaluation and followed by palliative care Follow up in ED if symptoms worsen or new appear   Home Health: Home with PT/OT/RN/social work Equipment/Devices: None  Discharge Condition: Guarded to poor CODE STATUS: Full Diet recommendation: Heart healthy/carb modified/fluid restriction of up to 1500 cc a day  Brief/Interim Summary: 65 y.o. female with medical history significant of diabetes mellitus type 2, diabetic neuropathy, hypertension, chronic diastolic CHF, chronic anemia, chronic kidney disease stage III, morbid obesity, hemorrhagic shock secondary to GI bleeding requiring embolization, OSA on CPAP, anxiety and depression presented to the hospital with elevated potassium.  On presentation, potassium was 6.3 with creatinine of 1.25, hemoglobin of 7.7  with no EKG changes of hyperkalemia.  She was given a dose of Lokelma. Chest x-ray showed questionable right lower lobe infiltrate versus artifact.  She was started on Lasix and given IV sodium bicarbonate/calcium gluconate/regular insulin/dextrose/nebulized albuterol.  Repeat potassium was still high.  Nephrology was consulted.  During the hospitalization, her potassium is gradually improving although intermittently on the higher side.  She has had issues with acute kidney injury requiring high doses of IV Lasix.  Nephrology has switched her to oral Lasix and has cleared her for discharge.  Palliative care has evaluated the patient and patient remains full code.  She will be discharged home today with home health with outpatient follow-up with  PCP/nephrology/palliative care.  Discharge Diagnoses:   Hyperkalemia: Intermittent -Most likely related to lisinopril use. -Lisinopril on hold. -Nephrology following.  Potassium 5.2 today.  Continue Lokelma upon discharge.  Nephrology has cleared the patient for discharge.  Outpatient follow-up with nephrology.  Repeat BMP within a week.  Do not restart lisinopril.   Acute on chronic diastolic heart failure -Patient is morbidly obese as well and volume status assessment is difficult.    Echo showed EF of 65 to 70% - Lasix plan as below.  Continue low-sodium diet and fluid restriction upon discharge.  Outpatient follow-up with cardiology.    Acute on chronic kidney disease stage IIIa -Possibly from volume overload.   -Renal ultrasound was negative for hydronephrosis -Nephrology followed the patient during his hospitalization and patient was treated with high doses of IV Lasix.  Nephrology switched her to oral Lasix 80 mg twice a day on 08/05/2020 and signed off.  Creatinine 2.55 today.  Nephrology/Dr. Jonnie Finner is aware of the slight rise in creatinine today and has cleared the patient for discharge on Lasix 80 mg twice a day till reevaluation by PCP/nephrology.  Patient is not a good candidate for dialysis as per nephrology. -Patient also on midodrine as per nephrology which will be continued.   Acute on chronic anemia of chronic disease -Possibly from kidney disease.  Status post 1 unit packed red cell transfusion during this hospitalization.  - No overt signs of bleeding. -Hemoglobin currently stable.  Outpatient follow-up   Thrombocytosis -Possibly reactive.  Outpatient follow-up  Diabetes mellitus type 2 - Carb modified diet.  Resume Januvia on discharge Actos will remain on hold.   Hypertension Hypotension -Monitor blood pressure.    Blood pressure on the lower side.  Midodrine added as per nephrology.  Intermittent delirium -Patient has had episodes of  intermittent decreased  mentation and delirium.  She used to be on gabapentin at home and was started on Lyrica while in the hospital as per her request.  Will DC all of these sedative medications upon discharge and patient will need to follow-up with PCP regarding resumption of these medications.  Trazodone as needed at night can be continued for now.   Anxiety and depression -Outpatient follow-up   Morbid obesity -Outpatient follow-up   Generalized conditioning -Patient declining SNF.  Wishes to return home at time of discharge. -Overall prognosis is guarded to poor.  Palliative care following.  Currently remains full code. -Outpatient follow-up with palliative care  Discharge Instructions  Discharge Instructions     Amb Referral to Palliative Care   Complete by: As directed    Goals of care   Diet - low sodium heart healthy   Complete by: As directed    Discharge wound care:   Complete by: As directed    Cleanse both wounds with NS then place a piece of Aquacel Ag+ Advantage [name] # [id number]) in the wound, cover with 4 x 4 and secure with foam dressing. Change BID. Use normal saline not sterile water to loosen the Aquacel if needed when performing dressing change.   Increase activity slowly   Complete by: As directed       Allergies as of 08/06/2020       Reactions   Morphine Itching, Rash   Penicillins Itching, Nausea And Vomiting, Rash, Hives   Has patient had a PCN reaction causing immediate rash, facial/tongue/throat swelling, SOB or lightheadedness with hypotension: No Has patient had a PCN reaction causing severe rash involving mucus membranes or skin necrosis: No Has patient had a PCN reaction that required hospitalization: No Has patient had a PCN reaction occurring within the last 10 years: No If all of the above answers are "NO", then may proceed with Cephalosporin use. Has patient had a PCN reaction causing immediate rash, facial/tongue/throat swelling, SOB or lightheadedness with  hypotension: No Has patient had a PCN reaction causing severe rash involving mucus membranes or skin necrosis: No Has patient had a PCN reaction that required hospitalization: No Has patient had a PCN reaction occurring within the last 10 years: No If all of the above answers are "NO", then may proceed with Cephalosporin use.   Lisinopril Swelling        Medication List     STOP taking these medications    allopurinol 100 MG tablet Commonly known as: ZYLOPRIM   ALPRAZolam 0.5 MG tablet Commonly known as: XANAX   gabapentin 600 MG tablet Commonly known as: NEURONTIN   pantoprazole 40 MG tablet Commonly known as: Protonix   pioglitazone 30 MG tablet Commonly known as: ACTOS       TAKE these medications    albuterol 108 (90 Base) MCG/ACT inhaler Commonly known as: VENTOLIN HFA Inhale 2 puffs into the lungs every 6 (six) hours as needed for wheezing or shortness of breath.   atorvastatin 10 MG tablet Commonly known as: LIPITOR Take 10 mg by mouth daily.   famotidine 20 MG tablet Commonly known as: PEPCID Take 20 mg by mouth at bedtime.   furosemide 80 MG tablet Commonly known as: LASIX Take 1 tablet (80 mg total) by mouth 2 (two) times daily. What changed:  medication strength how much to take when to take this   Lokelma 10 g Pack packet Generic drug: sodium zirconium cyclosilicate Take 10 g by mouth 2 (two) times  daily for 7 days.   midodrine 5 MG tablet Commonly known as: PROAMATINE Take 3 tablets (15 mg total) by mouth 3 (three) times daily with meals.   polyethylene glycol 17 g packet Commonly known as: MIRALAX / GLYCOLAX Take 17 g by mouth daily as needed.   senna-docusate 8.6-50 MG tablet Commonly known as: Senokot-S Take 1 tablet by mouth daily. Started 11/24/2019   sitaGLIPtin 100 MG tablet Commonly known as: JANUVIA Take 100 mg by mouth daily.   traZODone 50 MG tablet Commonly known as: DESYREL Take 1 tablet by mouth at bedtime.                Discharge Care Instructions  (From admission, onward)           Start     Ordered   08/06/20 0000  Discharge wound care:       Comments: Cleanse both wounds with NS then place a piece of Aquacel Ag+ Advantage [name] # [phone]) in the wound, cover with 4 x 4 and secure with foam dressing. Change BID. Use normal saline not sterile water to loosen the Aquacel if needed when performing dressing change.   08/06/20 1240            Follow-up Information     PCP. Schedule an appointment as soon as possible for a visit in 1 week(s).   Why: with repeat cbc/bmp        nephrologist. Schedule an appointment as soon as possible for a visit in 1 week(s).                 Allergies  Allergen Reactions   Morphine Itching and Rash   Penicillins Itching, Nausea And Vomiting, Rash and Hives    Has patient had a PCN reaction causing immediate rash, facial/tongue/throat swelling, SOB or lightheadedness with hypotension: No Has patient had a PCN reaction causing severe rash involving mucus membranes or skin necrosis: No Has patient had a PCN reaction that required hospitalization: No Has patient had a PCN reaction occurring within the last 10 years: No If all of the above answers are "NO", then may proceed with Cephalosporin use.  Has patient had a PCN reaction causing immediate rash, facial/tongue/throat swelling, SOB or lightheadedness with hypotension: No Has patient had a PCN reaction causing severe rash involving mucus membranes or skin necrosis: No Has patient had a PCN reaction that required hospitalization: No Has patient had a PCN reaction occurring within the last 10 years: No If all of the above answers are "NO", then may proceed with Cephalosporin use.    Lisinopril Swelling    Consultations: Nephrology/palliative care   Procedures/Studies: US RENAL  Result Date: 07/26/2020 CLINICAL DATA:  Acute renal failure superimposed upon stage 3 chronic kidney  disease EXAM: RENAL / URINARY TRACT ULTRASOUND COMPLETE COMPARISON:  06/28/2014 FINDINGS: Right Kidney: Renal measurements: 11.6 x 4.9 x 5.0 cm = volume: 148 mL. The lower pole is slightly obscured by overlying bowel gas. Echogenicity within normal limits. No mass or hydronephrosis visualized within the visualized portion. Left Kidney: Obscured by overlying bowel gas Bladder: Obscured by overlying bowel gas Other: None. IMPRESSION: Technically limited examination with nonvisualization of the left kidney and bladder. Normal appearance of the visualized right kidney. Electronically Signed   By: Fidela Salisbury MD   On: 07/26/2020 21:33   DG Chest Port 1 View  Result Date: 07/25/2020 CLINICAL DATA:  Shortness of breath, abnormal labs, diabetes mellitus, hypertension EXAM: PORTABLE CHEST 1 VIEW COMPARISON:  Portable exam 1517 hours compared to 11/05/2019 FINDINGS: Enlargement of cardiac silhouette. Pulmonary vascularity normal. Prominent superior mediastinum which may be related to lordotic positioning. Accentuated markings in the mid to lower RIGHT lung, could be related to underpenetration though infiltrate not completely excluded. Mild central peribronchial thickening, chronic. Remaining lungs clear. No pleural effusion or pneumothorax. IMPRESSION: Enlargement of cardiac silhouette. Slightly accentuated markings in the mid to lower RIGHT lung question infiltrate versus artifact related to underpenetration. Remaining lungs grossly clear. Electronically Signed   By: Lavonia Dana M.D.   On: 07/25/2020 15:30   ECHOCARDIOGRAM COMPLETE  Result Date: 07/26/2020    ECHOCARDIOGRAM REPORT   Patient Name:   [name] [name] Date of Exam: 07/26/2020 Medical Rec #:  [id number]          Height:       62.0 in Accession #:    [id number]         Weight:       336.0 lb Date of Birth:  January 31, 1956          BSA:          2.383 m Patient Age:    51 years           BP:           117/45 mmHg Patient Gender: F                  HR:            91 bpm. Exam Location:  Inpatient Procedure: 2D Echo, Cardiac Doppler and Color Doppler Indications:    R06.02 SOB  History:        Patient has prior history of Echocardiogram examinations, most                 recent 11/23/2019. Risk Factors:Hypertension, Diabetes and                 Dyslipidemia. Opiate overdose.  Sonographer:    Roseanna Rainbow RDCS Referring Phys: [id number] [facility]  Sonographer Comments: Technically difficult study due to poor echo windows, suboptimal parasternal window, suboptimal apical window and patient is morbidly obese. Image acquisition challenging due to patient body habitus. Patient moving and talking during exam. Images off axis due to habitus and patient complained of pain from probe pressure. IMPRESSIONS  1. Left ventricular ejection fraction, by estimation, is 65 to 70%. The left ventricle has normal function. The left ventricle has no regional wall motion abnormalities. Left ventricular diastolic parameters are indeterminate.  2. Right ventricular systolic function is normal. The right ventricular size is mildly enlarged. There is mildly elevated pulmonary artery systolic pressure.  3. The mitral valve is normal in structure. Trivial mitral valve regurgitation. No evidence of mitral stenosis.  4. The aortic valve is tricuspid. Aortic valve regurgitation is not visualized. No aortic stenosis is present.  5. The inferior vena cava is normal in size with greater than 50% respiratory variability, suggesting right atrial pressure of 3 mmHg. FINDINGS  Left Ventricle: Left ventricular ejection fraction, by estimation, is 65 to 70%. The left ventricle has normal function. The left ventricle has no regional wall motion abnormalities. The left ventricular internal cavity size was normal in size. There is  no left ventricular hypertrophy. Left ventricular diastolic parameters are indeterminate. Right Ventricle: The right ventricular size is mildly enlarged. No increase in right ventricular  wall thickness. Right ventricular systolic function is normal. There is mildly elevated pulmonary artery systolic pressure. The tricuspid regurgitant  velocity is 2.98 m/s, and with an assumed right atrial pressure of 3 mmHg, the estimated right ventricular systolic pressure is 93.8 mmHg. Left Atrium: Left atrial size was normal in size. Right Atrium: Right atrial size was normal in size. Pericardium: Trivial pericardial effusion is present. Mitral Valve: The mitral valve is normal in structure. Trivial mitral valve regurgitation. No evidence of mitral valve stenosis. Tricuspid Valve: The tricuspid valve is normal in structure. Tricuspid valve regurgitation is trivial. No evidence of tricuspid stenosis. Aortic Valve: The aortic valve is tricuspid. Aortic valve regurgitation is not visualized. No aortic stenosis is present. Pulmonic Valve: The pulmonic valve was normal in structure. Pulmonic valve regurgitation is not visualized. No evidence of pulmonic stenosis. Aorta: The aortic root is normal in size and structure. Venous: The inferior vena cava is normal in size with greater than 50% respiratory variability, suggesting right atrial pressure of 3 mmHg. IAS/Shunts: No atrial level shunt detected by color flow Doppler.  LEFT VENTRICLE PLAX 2D LVIDd:         3.90 cm     Diastology LVIDs:         2.40 cm     LV e' medial:    9.68 cm/s LV PW:         1.40 cm     LV E/e' medial:  11.4 LV IVS:        1.30 cm     LV e' lateral:   8.38 cm/s LVOT diam:     2.00 cm     LV E/e' lateral: 13.1 LV SV:         79 LV SV Index:   33 LVOT Area:     3.14 cm  LV Volumes (MOD) LV vol d, MOD A2C: 48.7 ml LV vol d, MOD A4C: 69.3 ml LV vol s, MOD A2C: 19.8 ml LV vol s, MOD A4C: 25.2 ml LV SV MOD A2C:     28.9 ml LV SV MOD A4C:     69.3 ml LV SV MOD BP:      36.9 ml RIGHT VENTRICLE             IVC RV S prime:     19.50 cm/s  IVC diam: 1.60 cm TAPSE (M-mode): 2.1 cm LEFT ATRIUM             Index       RIGHT ATRIUM           Index LA diam:         3.90 cm 1.64 cm/m  RA Area:     14.40 cm LA Vol (A2C):   41.1 ml 17.25 ml/m RA Volume:   32.90 ml  13.80 ml/m LA Vol (A4C):   61.7 ml 25.89 ml/m LA Biplane Vol: 55.0 ml 23.08 ml/m  AORTIC VALVE             PULMONIC VALVE LVOT Vmax:   140.00 cm/s PR End Diast Vel: 1.83 msec LVOT Vmean:  90.100 cm/s LVOT VTI:    0.253 m  AORTA Ao Root diam: 3.00 cm Ao Asc diam:  3.30 cm MITRAL VALVE                TRICUSPID VALVE MV Area (PHT): 6.71 cm     TR Peak grad:   35.5 mmHg MV Decel Time: 113 msec     TR Vmax:        298.00 cm/s MV E velocity: 110.00 cm/s MV A velocity: 98.50 cm/s  SHUNTS MV E/A ratio:  1.12         Systemic VTI:  0.25 m                             Systemic Diam: 2.00 cm Skeet Latch MD Electronically signed by Skeet Latch MD Signature Date/Time: 07/26/2020/12:38:24 PM    Final       Subjective: Patient seen and examined at bedside.  As per nursing staff, patient was less responsive overnight requiring BiPAP.  This morning, she is more awake and feels okay going home today.  Slow to respond but answers questions appropriately.  No overnight fever or vomiting reported.  Discharge Exam: Vitals:   08/06/20 1000 08/06/20 1100  BP: 123/87 123/87  Pulse: 78 69  Resp: 14 16  Temp:  (!) 97.3 F (36.3 C)  SpO2: 100% 100%    General: Pt is alert, awake, not in acute distress.  Looks chronically ill.  Slow to respond.  Poor historian. Cardiovascular: rate controlled, S1/S2 + Respiratory: bilateral decreased breath sounds at bases with scattered crackles Abdominal: Soft, morbidly obese, NT, ND, bowel sounds + Extremities: Bilateral lower extremity edema present; no cyanosis    The results of significant diagnostics from this hospitalization (including imaging, microbiology, ancillary and laboratory) are listed below for reference.     Microbiology: Recent Results (from the past 240 hour(s))  Urine Culture     Status: Abnormal   Collection Time: 07/27/20  4:40 PM    Specimen: Urine, Random  Result Value Ref Range Status   Specimen Description   Final    URINE, RANDOM Performed at [facility] [address] [place], [place] [address]    Special Requests   Final    NONE Performed at [facility], [place] [address] [place],  [address]    Culture 50,000 COLONIES/mL KLEBSIELLA PNEUMONIAE (A)  Final   Report Status 07/30/2020 FINAL  Final   Organism ID, Bacteria KLEBSIELLA PNEUMONIAE (A)  Final      Susceptibility   Klebsiella pneumoniae - MIC*    AMPICILLIN >=32 RESISTANT Resistant     CEFAZOLIN <=4 SENSITIVE Sensitive     CEFEPIME <=0.12 SENSITIVE Sensitive     CEFTRIAXONE <=0.25 SENSITIVE Sensitive     CIPROFLOXACIN <=0.25 SENSITIVE Sensitive     GENTAMICIN <=1 SENSITIVE Sensitive     IMIPENEM <=0.25 SENSITIVE Sensitive     NITROFURANTOIN 64 INTERMEDIATE Intermediate     TRIMETH/SULFA <=20 SENSITIVE Sensitive     AMPICILLIN/SULBACTAM 4 SENSITIVE Sensitive     PIP/TAZO <=4 SENSITIVE Sensitive     * 50,000 COLONIES/mL KLEBSIELLA PNEUMONIAE     Labs: BNP (last 3 results) Recent Labs    11/05/19 2216 11/22/19 1417 07/25/20 1432  BNP 608.6* 333.7* 563.1*   Basic Metabolic Panel: Recent Labs  Lab 07/31/20 0423 08/01/20 0434 08/02/20 0441 08/03/20 0431 08/04/20 0403 08/04/20 1309 08/05/20 0428 08/06/20 0029  NA 140 139 140 139 141 139 139 140  K 5.5* 4.8 5.0 4.9 5.5* 5.3* 5.7* 5.2*  CL 105 102 103 100 96* 95* 97* 95*  CO2 22 27 27 30  33* 34* 33* 35*  GLUCOSE 89 80 84 94 120* 115* 92 126*  BUN 78* 79* 79* 79* 80* 82* 87* 89*  CREATININE 2.41* 2.14* 2.12* 2.27* 1.90* 1.84* 2.12* 2.55*  CALCIUM 9.6 9.5 10.0 10.0 11.0* 10.8* 10.4* 10.2  MG 2.6* 2.3 2.3 2.1 2.6*  --  2.5* 2.8*  PHOS 5.8* 5.2* 5.1* 5.3*  --   --   --   --    Liver Function Tests: Recent Labs  Lab 07/31/20 0423 08/01/20 0434 08/02/20 0441 08/03/20 0431  ALBUMIN 3.7 3.5 4.0 3.7   No results for input(s): LIPASE,  AMYLASE in the last 168 hours. No results for input(s): AMMONIA in the last 168 hours. CBC: Recent Labs  Lab 08/02/20 0441 08/03/20 0431 08/04/20 0403 08/05/20 0428 08/06/20 0029  WBC 7.3 7.6 8.2 8.2 8.2  NEUTROABS 4.0 4.1 5.0 4.3 4.6  HGB 10.0* 10.0* 11.5* 10.6* 10.4*  HCT 33.0* 32.8* 37.9 34.4* 34.2*  MCV 91.4 90.6 91.1 90.5 91.9  PLT 509* 491* 563* 470* 460*   Cardiac Enzymes: No results for input(s): CKTOTAL, CKMB, CKMBINDEX, TROPONINI in the last 168 hours. BNP: Invalid input(s): POCBNP CBG: Recent Labs  Lab 08/05/20 1134 08/05/20 1616 08/05/20 2026 08/06/20 0732 08/06/20 1141  GLUCAP 90 123* 152* 117* 125*   D-Dimer No results for input(s): DDIMER in the last 72 hours. Hgb A1c No results for input(s): HGBA1C in the last 72 hours. Lipid Profile No results for input(s): CHOL, HDL, LDLCALC, TRIG, CHOLHDL, LDLDIRECT in the last 72 hours. Thyroid function studies No results for input(s): TSH, T4TOTAL, T3FREE, THYROIDAB in the last 72 hours.  Invalid input(s): FREET3 Anemia work up No results for input(s): VITAMINB12, FOLATE, FERRITIN, TIBC, IRON, RETICCTPCT in the last 72 hours. Urinalysis    Component Value Date/Time   COLORURINE STRAW (A) 07/27/2020 1640   APPEARANCEUR CLEAR 07/27/2020 1640   LABSPEC 1.008 07/27/2020 1640   PHURINE 5.0 07/27/2020 1640   GLUCOSEU NEGATIVE 07/27/2020 1640   HGBUR NEGATIVE 07/27/2020 1640   BILIRUBINUR NEGATIVE 07/27/2020 1640   KETONESUR NEGATIVE 07/27/2020 1640   PROTEINUR NEGATIVE 07/27/2020 1640   UROBILINOGEN 0.2 06/28/2014 1500   NITRITE NEGATIVE 07/27/2020 1640   LEUKOCYTESUR NEGATIVE 07/27/2020 1640   Sepsis Labs Invalid input(s): PROCALCITONIN,  WBC,  LACTICIDVEN Microbiology Recent Results (from the past 240 hour(s))  Urine Culture     Status: Abnormal   Collection Time: 07/27/20  4:40 PM   Specimen: Urine, Random  Result Value Ref Range Status   Specimen Description   Final    URINE, RANDOM Performed at  [facility], [place] [address] [place], [place] [address]    Special Requests   Final    NONE Performed at [facility], [place] [address] [place], [place] [address]    Culture 50,000 COLONIES/mL KLEBSIELLA PNEUMONIAE (A)  Final   Report Status 07/30/2020 FINAL  Final   Organism ID, Bacteria KLEBSIELLA PNEUMONIAE (A)  Final      Susceptibility   Klebsiella pneumoniae - MIC*    AMPICILLIN >=32 RESISTANT Resistant     CEFAZOLIN <=4 SENSITIVE Sensitive     CEFEPIME <=0.12 SENSITIVE Sensitive     CEFTRIAXONE <=0.25 SENSITIVE Sensitive     CIPROFLOXACIN <=0.25 SENSITIVE Sensitive     GENTAMICIN <=1 SENSITIVE Sensitive     IMIPENEM <=0.25 SENSITIVE Sensitive     NITROFURANTOIN 64 INTERMEDIATE Intermediate     TRIMETH/SULFA <=20 SENSITIVE Sensitive     AMPICILLIN/SULBACTAM 4 SENSITIVE Sensitive     PIP/TAZO <=4 SENSITIVE Sensitive     * 50,000 COLONIES/mL KLEBSIELLA PNEUMONIAE     Time coordinating discharge: 35 minutes  SIGNED:   Aline August, MD  [facility] 08/06/2020, 12:43 PM

## 2020-08-06 NOTE — TOC Progression Note (Addendum)
Transition of Care [facility]) - Progression Note    Patient Details  Name: [name] [name] MRN: [id number] Date of Birth: 03/19/1955  [facility] of [facility]) CM/SW Contact  [name], RN Phone Number: 08/06/2020, 2:26 PM  Clinical Narrative:    [name] was called for transportation to home. Pt's son Lanny Hurst was called and is aware that PTAR may be later than 4PM. Lanny Hurst asked that he is called when PTAR arrive to transport pt. [facility] in house rep is aware of pt's discharge. Pt will call the [name] when she is at home. TOC signing off.    Expected Discharge Plan: Skilled Nursing Facility Barriers to Discharge: No Barriers Identified  Expected Discharge Plan and Services Expected Discharge Plan: [place] arrangements for the past 2 months: Hotel/Motel Expected Discharge Date: 08/06/20                                     Social Determinants of Health (SDOH) Interventions    Readmission Risk Interventions Readmission Risk Prevention Plan 02/06/2020 11/08/2019  Transportation Screening Complete Complete  Medication Review [employer]) - Referral to Pharmacy  PCP or Specialist appointment within 3-5 days of discharge Complete Complete  HRI or [place] - Complete  SW Recovery Care/Counseling Consult Complete Complete  Palliative Care Screening Not Applicable Not Applicable  Skilled Nursing Facility Complete Complete  Some recent data might be hidden

## 2020-08-07 NOTE — TOC Progression Note (Signed)
Transition of Care [facility]) - Progression Note    Patient Details  Name: [name] [name] MRN: [id number] Date of Birth: 24-Mar-1955  Transition of Care [facility]) CM/SW Contact  [name], RN Phone Number: 08/07/2020, 3:57 PM  Clinical Narrative:     [employer] was called and fax O2 Orders for home O2.        Expected Discharge Plan and Services                                                 Social Determinants of Health (SDOH) Interventions    Readmission Risk Interventions Readmission Risk Prevention Plan 02/06/2020 11/08/2019  Transportation Screening Complete Complete  Medication Review [employer]) - Referral to Pharmacy  PCP or Specialist appointment within 3-5 days of discharge Complete Complete  HRI or [place] - Complete  SW Recovery Care/Counseling Consult Complete Complete  Palliative Care Screening Not Applicable Not Applicable  Skilled Nursing Facility Complete Complete  Some recent data might be hidden

## 2020-08-07 NOTE — Progress Notes (Signed)
Patient received full oxygen tank with nasal cannula tubing, opener and extension from hospital storage room of [facility] per approval/assistance by House supervisor. (Located on 6th floor of [facility]) Tank and supplies given to [employer] - patient sister. 4E/W CN provided Education and handout for care and usage of home oxygen to sister.  Family understood education of oxygen care and usage. Appreciated supplies. Will follow-up with [facility] and PCP for additional care concercerns per discharge instruction.

## 2020-08-07 NOTE — Progress Notes (Signed)
Actual date/ time: 08/07/20 1200 Pt's sister called requesting specific instruction on diet.  Referred her to the AVS which contained DASH diet instructions thoroughly.  Reviewed basics with patient's sister.  No further questions. Andre Lefort

## 2020-08-07 NOTE — Plan of Care (Signed)
  Problem: Education: Goal: Knowledge of General Education information will improve Description: Including pain rating scale, medication(s)/side effects and non-pharmacologic comfort measures Outcome: Completed/Met   Problem: Health Behavior/Discharge Planning: Goal: Ability to manage health-related needs will improve Outcome: Completed/Met   Problem: Clinical Measurements: Goal: Ability to maintain clinical measurements within normal limits will improve Outcome: Completed/Met Goal: Diagnostic test results will improve Outcome: Completed/Met Goal: Cardiovascular complication will be avoided Outcome: Completed/Met   Problem: Activity: Goal: Risk for activity intolerance will decrease Outcome: Completed/Met   Problem: Nutrition: Goal: Adequate nutrition will be maintained Outcome: Completed/Met   Problem: Elimination: Goal: Will not experience complications related to bowel motility Outcome: Completed/Met Goal: Will not experience complications related to urinary retention Outcome: Completed/Met   Problem: Pain Managment: Goal: General experience of comfort will improve Outcome: Completed/Met   Problem: Safety: Goal: Ability to remain free from injury will improve Outcome: Completed/Met   Problem: Skin Integrity: Goal: Risk for impaired skin integrity will decrease Outcome: Completed/Met

## 2020-08-07 NOTE — TOC Progression Note (Signed)
Transition of Care [facility]) - Progression Note    Patient Details  Name: [name] [name] MRN: [id number] Date of Birth: 1955-04-06  Transition of [facility]) CM/SW Contact  [name], RN Phone Number: 08/07/2020, 11:56 AM  Clinical Narrative:    Discharge chart was opened related to pt's return call last night concerning not having O2. After reviewing pt's chart no O2 was ordered. A call to pt was made who states that she will need O2 at home. [facility] was called to have [place] check O2 sats, also to follow up on pt needing O2. Pt was made aware.     Expected Discharge Plan: Skilled Nursing Facility Barriers to Discharge: No Barriers Identified  Expected Discharge Plan and Services Expected Discharge Plan: [place] arrangements for the past 2 months: Hotel/Motel Expected Discharge Date: 08/06/20                                     Social Determinants of Health (SDOH) Interventions    Readmission Risk Interventions Readmission Risk Prevention Plan 02/06/2020 11/08/2019  Transportation Screening Complete Complete  Medication Review Press photographer) - Referral to Pharmacy  PCP or Specialist appointment within 3-5 days of discharge Complete Complete  [place] - Complete  SW Recovery Care/Counseling Consult Complete Complete  Palliative Care Screening Not Applicable Not Applicable  Skilled Nursing Facility Complete Complete  Some recent data might be hidden

## 2020-08-07 NOTE — Care Management (Signed)
SATURATION QUALIFICATIONS: (This note is used to comply with regulatory documentation for home oxygen)  Patient Saturations on Room Air at Rest = 84%  Patient Saturations on Room Air while Ambulating = no%  Patient Saturations on 2 Liters of oxygen while Ambulating = 99%  Please briefly explain why patient needs home oxygen:  Pt does not ambulate. 84% at rest.

## 2020-08-08 DIAGNOSIS — L03119 Cellulitis of unspecified part of limb: Secondary | ICD-10-CM | POA: Diagnosis not present

## 2020-08-08 DIAGNOSIS — M25552 Pain in left hip: Secondary | ICD-10-CM | POA: Diagnosis not present

## 2020-08-25 ENCOUNTER — Other Ambulatory Visit: Payer: Self-pay

## 2020-08-25 ENCOUNTER — Inpatient Hospital Stay (HOSPITAL_COMMUNITY)
Admission: EM | Admit: 2020-08-25 | Discharge: 2020-08-29 | DRG: 683 | Disposition: A | Discharge status: Home Health | Payer: No Typology Code available for payment source | Attending: Internal Medicine | Admitting: Internal Medicine

## 2020-08-25 ENCOUNTER — Emergency Department (HOSPITAL_COMMUNITY): Payer: No Typology Code available for payment source

## 2020-08-25 DIAGNOSIS — Z7401 Bed confinement status: Secondary | ICD-10-CM

## 2020-08-25 DIAGNOSIS — F32A Depression, unspecified: Secondary | ICD-10-CM | POA: Diagnosis present

## 2020-08-25 DIAGNOSIS — N1831 Chronic kidney disease, stage 3a: Secondary | ICD-10-CM | POA: Diagnosis present

## 2020-08-25 DIAGNOSIS — Z79899 Other long term (current) drug therapy: Secondary | ICD-10-CM

## 2020-08-25 DIAGNOSIS — I5032 Chronic diastolic (congestive) heart failure: Secondary | ICD-10-CM | POA: Diagnosis present

## 2020-08-25 DIAGNOSIS — Z6841 Body Mass Index (BMI) 40.0 and over, adult: Secondary | ICD-10-CM

## 2020-08-25 DIAGNOSIS — R5381 Other malaise: Secondary | ICD-10-CM | POA: Diagnosis present

## 2020-08-25 DIAGNOSIS — F411 Generalized anxiety disorder: Secondary | ICD-10-CM | POA: Diagnosis present

## 2020-08-25 DIAGNOSIS — L89159 Pressure ulcer of sacral region, unspecified stage: Secondary | ICD-10-CM | POA: Diagnosis present

## 2020-08-25 DIAGNOSIS — L89109 Pressure ulcer of unspecified part of back, unspecified stage: Secondary | ICD-10-CM | POA: Diagnosis not present

## 2020-08-25 DIAGNOSIS — N183 Chronic kidney disease, stage 3 unspecified: Secondary | ICD-10-CM | POA: Diagnosis present

## 2020-08-25 DIAGNOSIS — Z888 Allergy status to other drugs, medicaments and biological substances status: Secondary | ICD-10-CM | POA: Diagnosis not present

## 2020-08-25 DIAGNOSIS — E1122 Type 2 diabetes mellitus with diabetic chronic kidney disease: Secondary | ICD-10-CM | POA: Diagnosis present

## 2020-08-25 DIAGNOSIS — R112 Nausea with vomiting, unspecified: Secondary | ICD-10-CM

## 2020-08-25 DIAGNOSIS — I130 Hypertensive heart and chronic kidney disease with heart failure and stage 1 through stage 4 chronic kidney disease, or unspecified chronic kidney disease: Secondary | ICD-10-CM | POA: Diagnosis present

## 2020-08-25 DIAGNOSIS — F419 Anxiety disorder, unspecified: Secondary | ICD-10-CM | POA: Diagnosis present

## 2020-08-25 DIAGNOSIS — Z87891 Personal history of nicotine dependence: Secondary | ICD-10-CM

## 2020-08-25 DIAGNOSIS — Z833 Family history of diabetes mellitus: Secondary | ICD-10-CM | POA: Diagnosis not present

## 2020-08-25 DIAGNOSIS — Z9989 Dependence on other enabling machines and devices: Secondary | ICD-10-CM

## 2020-08-25 DIAGNOSIS — Z880 Allergy status to penicillin: Secondary | ICD-10-CM | POA: Diagnosis not present

## 2020-08-25 DIAGNOSIS — N179 Acute kidney failure, unspecified: Secondary | ICD-10-CM | POA: Diagnosis not present

## 2020-08-25 DIAGNOSIS — K5900 Constipation, unspecified: Secondary | ICD-10-CM | POA: Diagnosis not present

## 2020-08-25 DIAGNOSIS — R627 Adult failure to thrive: Secondary | ICD-10-CM | POA: Diagnosis present

## 2020-08-25 DIAGNOSIS — Z885 Allergy status to narcotic agent status: Secondary | ICD-10-CM | POA: Diagnosis not present

## 2020-08-25 DIAGNOSIS — G4733 Obstructive sleep apnea (adult) (pediatric): Secondary | ICD-10-CM | POA: Diagnosis present

## 2020-08-25 DIAGNOSIS — Z20822 Contact with and (suspected) exposure to covid-19: Secondary | ICD-10-CM | POA: Diagnosis present

## 2020-08-25 DIAGNOSIS — E1140 Type 2 diabetes mellitus with diabetic neuropathy, unspecified: Secondary | ICD-10-CM | POA: Diagnosis present

## 2020-08-25 DIAGNOSIS — I5033 Acute on chronic diastolic (congestive) heart failure: Secondary | ICD-10-CM | POA: Diagnosis present

## 2020-08-25 DIAGNOSIS — K219 Gastro-esophageal reflux disease without esophagitis: Secondary | ICD-10-CM | POA: Diagnosis present

## 2020-08-25 DIAGNOSIS — Z7984 Long term (current) use of oral hypoglycemic drugs: Secondary | ICD-10-CM

## 2020-08-25 DIAGNOSIS — E860 Dehydration: Secondary | ICD-10-CM | POA: Diagnosis present

## 2020-08-25 DIAGNOSIS — L8990 Pressure ulcer of unspecified site, unspecified stage: Secondary | ICD-10-CM | POA: Diagnosis present

## 2020-08-25 DIAGNOSIS — D72829 Elevated white blood cell count, unspecified: Secondary | ICD-10-CM | POA: Diagnosis present

## 2020-08-25 DIAGNOSIS — E119 Type 2 diabetes mellitus without complications: Secondary | ICD-10-CM | POA: Diagnosis present

## 2020-08-25 DIAGNOSIS — R531 Weakness: Secondary | ICD-10-CM | POA: Diagnosis present

## 2020-08-25 LAB — CBC WITH DIFFERENTIAL/PLATELET
Abs Immature Granulocytes: 0.24 10*3/uL — ABNORMAL HIGH (ref 0.00–0.07)
Basophils Absolute: 0.1 10*3/uL (ref 0.0–0.1)
Basophils Relative: 1 %
Eosinophils Absolute: 0.1 10*3/uL (ref 0.0–0.5)
Eosinophils Relative: 1 %
HCT: 44.4 % (ref 36.0–46.0)
Hemoglobin: 13.8 g/dL (ref 12.0–15.0)
Immature Granulocytes: 2 %
Lymphocytes Relative: 16 %
Lymphs Abs: 1.8 10*3/uL (ref 0.7–4.0)
MCH: 27.4 pg (ref 26.0–34.0)
MCHC: 31.1 g/dL (ref 30.0–36.0)
MCV: 88.1 fL (ref 80.0–100.0)
Monocytes Absolute: 1.4 10*3/uL — ABNORMAL HIGH (ref 0.1–1.0)
Monocytes Relative: 12 %
Neutro Abs: 8.2 10*3/uL — ABNORMAL HIGH (ref 1.7–7.7)
Neutrophils Relative %: 68 %
Platelets: 397 10*3/uL (ref 150–400)
RBC: 5.04 MIL/uL (ref 3.87–5.11)
RDW: 14.3 % (ref 11.5–15.5)
WBC: 11.7 10*3/uL — ABNORMAL HIGH (ref 4.0–10.5)
nRBC: 0.2 % (ref 0.0–0.2)

## 2020-08-25 LAB — COMPREHENSIVE METABOLIC PANEL
ALT: 14 U/L (ref 0–44)
AST: 27 U/L (ref 15–41)
Albumin: 4.9 g/dL (ref 3.5–5.0)
Alkaline Phosphatase: 80 U/L (ref 38–126)
Anion gap: 22 — ABNORMAL HIGH (ref 5–15)
BUN: 161 mg/dL — ABNORMAL HIGH (ref 8–23)
CO2: 28 mmol/L (ref 22–32)
Calcium: 11.3 mg/dL — ABNORMAL HIGH (ref 8.9–10.3)
Chloride: 83 mmol/L — ABNORMAL LOW (ref 98–111)
Creatinine, Ser: 3.31 mg/dL — ABNORMAL HIGH (ref 0.44–1.00)
GFR, Estimated: 15 mL/min — ABNORMAL LOW (ref >60)
Glucose, Bld: 123 mg/dL — ABNORMAL HIGH (ref 70–99)
Potassium: 3.5 mmol/L (ref 3.5–5.1)
Sodium: 133 mmol/L — ABNORMAL LOW (ref 135–145)
Total Bilirubin: 1.5 mg/dL — ABNORMAL HIGH (ref 0.3–1.2)
Total Protein: 9 g/dL — ABNORMAL HIGH (ref 6.5–8.1)

## 2020-08-25 LAB — TROPONIN I (HIGH SENSITIVITY)
Troponin I (High Sensitivity): 59 ng/L — ABNORMAL HIGH (ref <18)
Troponin I (High Sensitivity): 53 ng/L — ABNORMAL HIGH (ref <18)

## 2020-08-25 LAB — BRAIN NATRIURETIC PEPTIDE: B Natriuretic Peptide: 55.3 pg/mL (ref 0.0–100.0)

## 2020-08-25 MED ORDER — FLEET ENEMA 7-19 GM/118ML RE ENEM
1.0000 | ENEMA | Freq: Once | RECTAL | Status: AC
  Administered 2020-08-25: 1 via RECTAL
  Filled 2020-08-25: qty 1

## 2020-08-25 MED ORDER — MAGNESIUM CITRATE PO SOLN
1.0000 | Freq: Once | ORAL | Status: AC
  Administered 2020-08-25: 1 via ORAL
  Filled 2020-08-25: qty 296

## 2020-08-25 MED ORDER — POLYETHYLENE GLYCOL 3350 17 G PO PACK
17.0000 g | PACK | Freq: Once | ORAL | Status: AC
  Administered 2020-08-25: 17 g via ORAL

## 2020-08-25 MED ORDER — SODIUM CHLORIDE 0.9 % IV BOLUS
500.0000 mL | Freq: Once | INTRAVENOUS | Status: AC
Start: 2020-08-25 — End: 2020-08-25
  Administered 2020-08-25: 500 mL via INTRAVENOUS

## 2020-08-25 MED ORDER — POLYETHYLENE GLYCOL 3350 17 G PO PACK
17.0000 g | PACK | Freq: Every day | ORAL | Status: DC
  Filled 2020-08-25: qty 1

## 2020-08-25 MED ORDER — ONDANSETRON 4 MG PO TBDP
4.0000 mg | ORAL_TABLET | Freq: Once | ORAL | Status: AC
  Administered 2020-08-25: 4 mg via ORAL
  Filled 2020-08-25: qty 1

## 2020-08-25 MED ORDER — SODIUM CHLORIDE 0.9 % IV BOLUS
500.0000 mL | Freq: Once | INTRAVENOUS | Status: AC
  Administered 2020-08-26: 500 mL via INTRAVENOUS

## 2020-08-25 MED ORDER — ONDANSETRON HCL 4 MG/2ML IJ SOLN
4.0000 mg | Freq: Once | INTRAMUSCULAR | Status: AC
  Administered 2020-08-25: 4 mg via INTRAVENOUS
  Filled 2020-08-25 (×2): qty 2

## 2020-08-25 NOTE — ED Notes (Signed)
Pt had two hard, small pieces of stool. Clean chux under patient now and purewick in place

## 2020-08-25 NOTE — ED Notes (Signed)
Patient returned from CT
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
## 2020-08-25 NOTE — ED Triage Notes (Signed)
Patient arrived from home by [facility] with complaint of nausea, decreased intake and constipation. Unsure if taking stool softner. Alert and oriented, unable to ambulate at home

## 2020-08-25 NOTE — ED Notes (Signed)
Troponin 59. MD made aware
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
## 2020-08-25 NOTE — ED Notes (Signed)
Patient transported to CT 

## 2020-08-25 NOTE — ED Notes (Signed)
IV team nurse at bedside. 

## 2020-08-26 ENCOUNTER — Encounter (HOSPITAL_COMMUNITY): Payer: Self-pay | Admitting: Family Medicine

## 2020-08-26 DIAGNOSIS — N179 Acute kidney failure, unspecified: Principal | ICD-10-CM

## 2020-08-26 DIAGNOSIS — R531 Weakness: Secondary | ICD-10-CM | POA: Diagnosis present

## 2020-08-26 DIAGNOSIS — K5900 Constipation, unspecified: Secondary | ICD-10-CM | POA: Insufficient documentation

## 2020-08-26 DIAGNOSIS — G4733 Obstructive sleep apnea (adult) (pediatric): Secondary | ICD-10-CM

## 2020-08-26 DIAGNOSIS — R5381 Other malaise: Secondary | ICD-10-CM | POA: Diagnosis present

## 2020-08-26 DIAGNOSIS — N1831 Chronic kidney disease, stage 3a: Secondary | ICD-10-CM

## 2020-08-26 LAB — GLUCOSE, CAPILLARY
Glucose-Capillary: 107 mg/dL — ABNORMAL HIGH (ref 70–99)
Glucose-Capillary: 137 mg/dL — ABNORMAL HIGH (ref 70–99)

## 2020-08-26 LAB — CBC
HCT: 45 % (ref 36.0–46.0)
Hemoglobin: 13.8 g/dL (ref 12.0–15.0)
MCH: 27.7 pg (ref 26.0–34.0)
MCHC: 30.7 g/dL (ref 30.0–36.0)
MCV: 90.4 fL (ref 80.0–100.0)
Platelets: 358 10*3/uL (ref 150–400)
RBC: 4.98 MIL/uL (ref 3.87–5.11)
RDW: 14.4 % (ref 11.5–15.5)
WBC: 10.9 10*3/uL — ABNORMAL HIGH (ref 4.0–10.5)
nRBC: 0 % (ref 0.0–0.2)

## 2020-08-26 LAB — HEPATIC FUNCTION PANEL
ALT: 11 U/L (ref 0–44)
AST: 22 U/L (ref 15–41)
Albumin: 4.8 g/dL (ref 3.5–5.0)
Alkaline Phosphatase: 75 U/L (ref 38–126)
Bilirubin, Direct: 0.3 mg/dL — ABNORMAL HIGH (ref 0.0–0.2)
Indirect Bilirubin: 1.1 mg/dL — ABNORMAL HIGH (ref 0.3–0.9)
Total Bilirubin: 1.4 mg/dL — ABNORMAL HIGH (ref 0.3–1.2)
Total Protein: 8.4 g/dL — ABNORMAL HIGH (ref 6.5–8.1)

## 2020-08-26 LAB — BASIC METABOLIC PANEL
Anion gap: 23 — ABNORMAL HIGH (ref 5–15)
BUN: 163 mg/dL — ABNORMAL HIGH (ref 8–23)
CO2: 24 mmol/L (ref 22–32)
Calcium: 11 mg/dL — ABNORMAL HIGH (ref 8.9–10.3)
Chloride: 90 mmol/L — ABNORMAL LOW (ref 98–111)
Creatinine, Ser: 3.27 mg/dL — ABNORMAL HIGH (ref 0.44–1.00)
GFR, Estimated: 15 mL/min — ABNORMAL LOW (ref >60)
Glucose, Bld: 127 mg/dL — ABNORMAL HIGH (ref 70–99)
Potassium: 3 mmol/L — ABNORMAL LOW (ref 3.5–5.1)
Sodium: 137 mmol/L (ref 135–145)

## 2020-08-26 LAB — CBG MONITORING, ED
Glucose-Capillary: 135 mg/dL — ABNORMAL HIGH (ref 70–99)
Glucose-Capillary: 118 mg/dL — ABNORMAL HIGH (ref 70–99)

## 2020-08-26 LAB — CREATININE, URINE, RANDOM: Creatinine, Urine: 81.52 mg/dL

## 2020-08-26 LAB — RESP PANEL BY RT-PCR (FLU A&B, COVID) ARPGX2
Influenza A by PCR: NEGATIVE
Influenza B by PCR: NEGATIVE
SARS Coronavirus 2 by RT PCR: NEGATIVE

## 2020-08-26 LAB — SODIUM, URINE, RANDOM: Sodium, Ur: 62 mmol/L

## 2020-08-26 LAB — PHOSPHORUS: Phosphorus: 4.4 mg/dL (ref 2.5–4.6)

## 2020-08-26 LAB — MAGNESIUM: Magnesium: 2.6 mg/dL — ABNORMAL HIGH (ref 1.7–2.4)

## 2020-08-26 LAB — CK: Total CK: 43 U/L (ref 38–234)

## 2020-08-26 MED ORDER — SODIUM CHLORIDE 0.9 % IV SOLN: INTRAVENOUS | Status: AC

## 2020-08-26 MED ORDER — SENNA 8.6 MG PO TABS: 1.0000 | ORAL_TABLET | Freq: Every day | ORAL | Status: DC | PRN

## 2020-08-26 MED ORDER — ATORVASTATIN CALCIUM 10 MG PO TABS
10.0000 mg | ORAL_TABLET | Freq: Every day | ORAL | Status: DC
  Administered 2020-08-28: 10 mg via ORAL
  Filled 2020-08-26 (×3): qty 1

## 2020-08-26 MED ORDER — HYDROXYZINE HCL 25 MG PO TABS: 25.0000 mg | ORAL_TABLET | Freq: Two times a day (BID) | ORAL | Status: DC | PRN

## 2020-08-26 MED ORDER — ACETAMINOPHEN 650 MG RE SUPP: 650.0000 mg | Freq: Four times a day (QID) | RECTAL | Status: DC | PRN

## 2020-08-26 MED ORDER — MIDODRINE HCL 5 MG PO TABS
15.0000 mg | ORAL_TABLET | Freq: Three times a day (TID) | ORAL | Status: DC
  Administered 2020-08-26 – 2020-08-28 (×6): 15 mg via ORAL
  Filled 2020-08-26 (×6): qty 3

## 2020-08-26 MED ORDER — ONDANSETRON HCL 4 MG PO TABS
4.0000 mg | ORAL_TABLET | Freq: Four times a day (QID) | ORAL | Status: DC | PRN
  Administered 2020-08-27: 4 mg via ORAL
  Filled 2020-08-26: qty 1

## 2020-08-26 MED ORDER — MIRTAZAPINE 15 MG PO TABS: 15.0000 mg | ORAL_TABLET | Freq: Every day | ORAL | Status: DC

## 2020-08-26 MED ORDER — ACETAMINOPHEN 325 MG PO TABS: 650.0000 mg | ORAL_TABLET | Freq: Four times a day (QID) | ORAL | Status: DC | PRN

## 2020-08-26 MED ORDER — ALBUTEROL SULFATE (2.5 MG/3ML) 0.083% IN NEBU: 2.5000 mg | INHALATION_SOLUTION | Freq: Four times a day (QID) | RESPIRATORY_TRACT | Status: DC | PRN

## 2020-08-26 MED ORDER — FAMOTIDINE 20 MG PO TABS
10.0000 mg | ORAL_TABLET | Freq: Every day | ORAL | Status: DC
  Administered 2020-08-26 – 2020-08-28 (×3): 10 mg via ORAL
  Filled 2020-08-26 (×4): qty 1

## 2020-08-26 MED ORDER — HYDROXYZINE PAMOATE 50 MG PO CAPS: 50.0000 mg | ORAL_CAPSULE | Freq: Four times a day (QID) | ORAL | Status: DC | PRN

## 2020-08-26 MED ORDER — INSULIN ASPART 100 UNIT/ML IJ SOLN
0.0000 [IU] | Freq: Three times a day (TID) | INTRAMUSCULAR | Status: DC
  Administered 2020-08-27 – 2020-08-28 (×2): 1 [IU] via SUBCUTANEOUS

## 2020-08-26 MED ORDER — ONDANSETRON HCL 4 MG/2ML IJ SOLN
4.0000 mg | Freq: Four times a day (QID) | INTRAMUSCULAR | Status: DC | PRN
  Administered 2020-08-27: 4 mg via INTRAVENOUS
  Filled 2020-08-26: qty 2

## 2020-08-26 MED ORDER — HEPARIN SODIUM (PORCINE) 5000 UNIT/ML IJ SOLN
5000.0000 [IU] | Freq: Three times a day (TID) | INTRAMUSCULAR | Status: DC
  Administered 2020-08-26 – 2020-08-28 (×8): 5000 [IU] via SUBCUTANEOUS
  Filled 2020-08-26 (×8): qty 1

## 2020-08-26 MED ORDER — HYDROCORTISONE 1 % EX CREA
1.0000 "application " | TOPICAL_CREAM | Freq: Two times a day (BID) | CUTANEOUS | Status: DC
  Administered 2020-08-27 – 2020-08-28 (×3): 1 via TOPICAL
  Filled 2020-08-26 (×2): qty 28

## 2020-08-26 MED ORDER — MIRTAZAPINE 15 MG PO TABS
7.5000 mg | ORAL_TABLET | Freq: Every day | ORAL | Status: DC
  Administered 2020-08-26 – 2020-08-28 (×2): 7.5 mg via ORAL
  Filled 2020-08-26 (×4): qty 1

## 2020-08-26 MED ORDER — ORAL CARE MOUTH RINSE
15.0000 mL | Freq: Two times a day (BID) | OROMUCOSAL | Status: DC
  Administered 2020-08-28 (×2): 15 mL via OROMUCOSAL
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
## 2020-08-26 NOTE — ED Notes (Signed)
Called to give report, secretary reports charge has not yet assigned an Therapist, sports and they will call back for report. Left number with Network engineer.

## 2020-08-26 NOTE — Progress Notes (Signed)
Called ED for the report. RN is unavailable at this time

## 2020-08-26 NOTE — Progress Notes (Signed)
Patient refused CPAP. No unit in room at this time. Patient told to call if she changed her mind.

## 2020-08-26 NOTE — ED Notes (Signed)
Pt placed on hospital bed for comfort.
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
## 2020-08-26 NOTE — ED Notes (Signed)
Pt sleeping and had an O2 desat to 70s, this RN sat pt up and applied 2L Gas to pt and O2 sats increased to 96%.

## 2020-08-26 NOTE — Evaluation (Signed)
Physical Therapy Evaluation Patient Details Name: EDANA AGUADO MRN: [id number] DOB: 1955-06-25 Today's Date: 08/26/2020   History of Present Illness  65 y.o. female presents to [facility] ED on 08/25/2020 with reports of generalized weakness and loss of apetite. Pt recently admitted one month ago with CHF exacerbation, was recommended to discharge to SNF but refused and discharged home. Pt admitted for management of AKI, CHF, sacral ulcers, and acute on chronic debility. PMH includes type 2 diabetes mellitus, chronic diastolic CHF, OSA, depression, anxiety, chronic kidney disease stage IIIa.  Clinical Impression  Pt presents to PT not far from recent baseline. Pt reports she has not sat up or been lifted to wheelchair for months. Pt currently requires assistance to roll and is unable to achieve sitting at edge of bed with one person assistance. Pt expresses a goal to improve rolling and bed mobility in order to improve independence in the home. Pt also expresses a goal of going to outpatient PT for wheelchair training once her power wheelchair is delivered. PT discusses recommendation of SNF placement to provide maximal therapy to achieve goals due to patients current level of debility. Pt declines SNF placement adamantly. PT thus recommends HHPT for initial bed mobility training and exercise upon return home.    Follow Up Recommendations SNF    Equipment Recommendations  Other (comment) (pt reports she will receive a bariatric hospital bed and power wheelchair soon)    Recommendations for Other Services       Precautions / Restrictions Precautions Precautions: Fall Restrictions Weight Bearing Restrictions:  (L hip was not surgically fixated after fracture ~1 year ago)      Mobility  Bed Mobility Overal bed mobility: Needs Assistance Bed Mobility: Rolling;Supine to Sit Rolling: Mod assist   Supine to sit: Total assist     General bed mobility comments: unable to achieve sitting position  with one person assist. When rolling pt requires assistance to roll trunk, once able to grasp railing pt is able to pull into roll    Transfers                    Ambulation/Gait                Stairs            Wheelchair Mobility    Modified Rankin (Stroke Patients Only)       Balance                                             Pertinent Vitals/Pain Pain Assessment: Faces Faces Pain Scale: Hurts whole lot Pain Location: wounds in skin folds, also pain to bilateral feet to touch Pain Descriptors / Indicators: Grimacing Pain Intervention(s): Monitored during session    Home Living Family/patient expects to be discharged to:: Private residence Living Arrangements: Alone Available Help at Discharge: Friend(s);Personal care attendant (PCA assist monday-thursday from 9-12 and 5-9. Assist friday-sunday 9-12. Pt's friend stays weekend nights friday-sunday night) Type of Home: [place]: Ramped entrance     Home Layout: One level Home Equipment: Hospital bed;Walker - 4 wheels;Wheelchair - Education administrator (comment);Shower seat (pt reports she is scheduled to receive a bariatric hospital bed very soon and a power wheelchair son as well)      Prior Function Level of Independence: Needs assistance   Gait / Transfers Assistance Needed: pt reports not  sitting at edge of bed or being lifted to wheelchair in a considerable time. Pt is able to assist with rolling.  ADL's / Homemaking Assistance Needed: Pt requires assistance for all ADLs, performed at bed level        Hand Dominance   Dominant Hand: Right    Extremity/Trunk Assessment   Upper Extremity Assessment Upper Extremity Assessment: RUE deficits/detail;LUE deficits/detail RUE Deficits / Details: UE shoulder horizontal adduction limited due to morbid obesity LUE Deficits / Details: UE shoulder horizontal adduction limited due to morbid obesity    Lower Extremity  Assessment Lower Extremity Assessment: Generalized weakness LLE Deficits / Details: LLE deformity noted near proximal femur, possibly present from fracture over a year ago    Cervical / Trunk Assessment Cervical / Trunk Assessment: Other exceptions Cervical / Trunk Exceptions: morbid obesity  Communication   Communication: No difficulties  Cognition Arousal/Alertness: Awake/alert Behavior During Therapy: WFL for tasks assessed/performed Overall Cognitive Status: Within Functional Limits for tasks assessed                                 General Comments: pt has a poor understanding of her current health condition, reporting she is healthy and her main issue is excess fluid      General Comments General comments (skin integrity, edema, etc.): VSS on 2L [place]. Pt with multiple wounds present in skin folds, RN dressing wounds bilaterally in skin folds at beginning of PT eval    Exercises     Assessment/Plan    PT Assessment Patient needs continued PT services  PT Problem List Decreased strength;Decreased range of motion;Decreased balance;Decreased activity tolerance;Decreased mobility;Pain       PT Treatment Interventions DME instruction;Functional mobility training;Therapeutic activities;Therapeutic exercise;Balance training;Patient/family education    PT Goals (Current goals can be found in the Care Plan section)  Acute Rehab PT Goals Patient Stated Goal: to be able to roll and mobilize in bed with less assistance. Long term goal to go to outpatient PT for training on use of power wheelchair PT Goal Formulation: With patient Time For Goal Achievement: 09/09/20 Potential to Achieve Goals: Poor Additional Goals Additional Goal #1: Pt will verbalize pressure relief techniques and initiate a turning schedule with nursing staff this admission    Frequency Min 2X/week   Barriers to discharge        Co-evaluation               AM-PAC PT "6 Clicks" Mobility   Outcome Measure Help needed turning from your back to your side while in a flat bed without using bedrails?: A Lot Help needed moving from lying on your back to sitting on the side of a flat bed without using bedrails?: Total Help needed moving to and from a bed to a chair (including a wheelchair)?: Total Help needed standing up from a chair using your arms (e.g., wheelchair or bedside chair)?: Total Help needed to walk in hospital room?: Total Help needed climbing 3-5 steps with a railing? : Total 6 Click Score: 7    End of Session Equipment Utilized During Treatment: Oxygen Activity Tolerance: Patient tolerated treatment well Patient left: in bed;with call bell/phone within reach Nurse Communication: Mobility status;Need for lift equipment PT Visit Diagnosis: Other abnormalities of gait and mobility (R26.89);Pain Pain - Right/Left:  (multiple wounds bilaterally)    Time: 2353-6144 PT Time Calculation (min) (ACUTE ONLY): 36 min   Charges:   PT Evaluation $  PT Eval Low Complexity: 1 Low          Zenaida Niece, PT, DPT Acute Rehabilitation Pager: [phone]   Zenaida Niece 08/26/2020, 6:03 PM

## 2020-08-26 NOTE — Progress Notes (Signed)
PROGRESS NOTE    CLORIA CIRESI  GXQ:[id number] DOB: Jul 31, 1955 DOA: 08/25/2020 PCP: Merryl Hacker, No   Brief Narrative:  [name] [name] is a 65 y.o. female with medical history significant for type 2 diabetes mellitus, chronic diastolic CHF, OSA, depression, anxiety, chronic kidney disease stage IIIa, and BMI of 59, now presenting to the emergency department for evaluation of progressive general weakness and loss of appetite. Patient was admitted to the hospital 1 month ago with acute on chronic diastolic CHF and hyperkalemia, was diuresed, had some worsening in her renal function, was not felt to be a dialysis candidate due to her bedbound status, was advised to discharge to rehab facility, but refused and went home where she has become progressively weak in general and has worsening appetite and some nausea without abdominal pain, vomiting, or diarrhea.  Patient reports that she lives alone, is bedbound, but has someone helping her during the day.    Assessment & Plan:   Principal Problem:   Acute renal failure superimposed on stage 3a chronic kidney disease, unspecified acute renal failure type (HCC) Active Problems:   Type 2 diabetes mellitus with stage 3 chronic kidney disease (HCC)   Depression   Anxiety state   Pressure injury of skin   Chronic diastolic CHF (congestive heart failure) (HCC)   Physical debility   OSA on CPAP  AKI superimposed on CKD IIIa  - Presents with progressive general weakness and poor appetite and is found to have BUN 161 and SCr 3.31 (up from 89 & 2.55 on 08/06/20 and 39 & 1.25 on 07/25/20)  -Likely secondary to poor p.o. intake, initiate clear liquid diet, advance as tolerated - She was given a liter of NS in ED  - Hold Lasix and chlorthalidone  Chronic diastolic CHF - Difficult to determine fluid-status clinically due to morbidly obese body habitus - EF was preserved on TTE one month ago  -Follow daily weights I's and O's, holding diuretics to give fluids  as above given AKI  Acute on chronic debility  - Patient is bed bound, lives alone but has someone helping her during the day, reports insidiously worsening general weakness and difficulty with transfers  - She previously refused SNF placement at recent hospital, lengthy discussion today about unsafe disposition home   Sacral decubitus ulcer, right sided, POA  Inferior pannus ulcer -Wound care to follow  depression, anxiety  - Continue Remeron, as-needed Vistaril     OSA  - Continue CPAP qHS     Type II DM  - A1c was 5.3% in June 2022  - Check CBGs, use low-intensity SSI if needed     Elevated troponin - Mild troponin elevation noted in ED - Troponin is decreasing, patient denies chest pain or SOB, does not have any acute ischemic changes on EKG, and no further workup indicated at this time     DVT prophylaxis: sq heparin  Code Status: Full, confirmed on admission  Family Communication: None present  Status is: Inpatient  Dispo: The patient is from: Home              Anticipated d/c is to: To be determined              Anticipated d/c date is: 48 to 72 hours              Patient currently not medically stable for discharge  Consultants:  None  Procedures:  None  Antimicrobials:  None indicated  Subjective: No acute issues  or events overnight, denies any nausea vomiting diarrhea constipation over the past 24 hours.  Objective: Vitals:   08/26/20 0430 08/26/20 0445 08/26/20 0500 08/26/20 0744  BP: (!) 124/52 (!) 167/67 (!) 171/66 (!) 120/52  Pulse: 95 96 98 94  Resp: (!) 23 (!) 22 (!) 24 18  Temp:      TempSrc:      SpO2: 96% 99% 90% 97%    Intake/Output Summary (Last 24 hours) at 08/26/2020 0759 Last data filed at 08/25/2020 2211 Gross per 24 hour  Intake 200 ml  Output --  Net 200 ml   There were no vitals filed for this visit.  Examination:  General:  Pleasantly resting in bed, No acute distress. HEENT:  Normocephalic atraumatic.  Sclerae  nonicteric, noninjected.  Extraocular movements intact bilaterally. Neck:  Without mass or deformity.  Trachea is midline. Lungs: Distant but clear without rhonchi rales Heart:  Regular rate and rhythm.  Without murmurs, rubs, or gallops. Abdomen:  Soft, nontender, nondistended.  Without guarding or rebound. Extremities: Without cyanosis, clubbing, edema, or obvious deformity. Vascular:  Dorsalis pedis and posterior tibial pulses palpable bilaterally. Skin:  Warm and dry, no erythema, no ulcerations.Right pannus ulcer 4 cm red base, left posterior sacral decubitus ulcer 5 cm with granulation tissue.  Data Reviewed: I have personally reviewed following labs and imaging studies  CBC: Recent Labs  Lab 08/25/20 1800 08/26/20 0500  WBC 11.7* 10.9*  NEUTROABS 8.2*  --   HGB 13.8 13.8  HCT 44.4 45.0  MCV 88.1 90.4  PLT 397 161   Basic Metabolic Panel: Recent Labs  Lab 08/25/20 1800 08/26/20 0500  NA 133* 137  K 3.5 3.0*  CL 83* 90*  CO2 28 24  GLUCOSE 123* 127*  BUN 161* 163*  CREATININE 3.31* 3.27*  CALCIUM 11.3* 11.0*  MG  --  2.6*  PHOS  --  4.4   GFR: CrCl cannot be calculated (Unknown ideal weight.). Liver Function Tests: Recent Labs  Lab 08/25/20 1800 08/26/20 0500  AST 27 22  ALT 14 11  ALKPHOS 80 75  BILITOT 1.5* 1.4*  PROT 9.0* 8.4*  ALBUMIN 4.9 4.8   No results for input(s): LIPASE, AMYLASE in the last 168 hours. No results for input(s): AMMONIA in the last 168 hours. Coagulation Profile: No results for input(s): INR, PROTIME in the last 168 hours. Cardiac Enzymes: Recent Labs  Lab 08/26/20 0500  CKTOTAL 43   BNP (last 3 results) No results for input(s): PROBNP in the last 8760 hours. HbA1C: No results for input(s): HGBA1C in the last 72 hours. CBG: No results for input(s): GLUCAP in the last 168 hours. Lipid Profile: No results for input(s): CHOL, HDL, LDLCALC, TRIG, CHOLHDL, LDLDIRECT in the last 72 hours. Thyroid Function Tests: No results  for input(s): TSH, T4TOTAL, FREET4, T3FREE, THYROIDAB in the last 72 hours. Anemia Panel: No results for input(s): VITAMINB12, FOLATE, FERRITIN, TIBC, IRON, RETICCTPCT in the last 72 hours. Sepsis Labs: No results for input(s): PROCALCITON, LATICACIDVEN in the last 168 hours.  Recent Results (from the past 240 hour(s))  Resp Panel by RT-PCR (Flu A&B, Covid) Nasopharyngeal Swab     Status: None   Collection Time: 08/25/20 10:23 PM   Specimen: Nasopharyngeal Swab; Nasopharyngeal(NP) swabs in vial transport medium  Result Value Ref Range Status   SARS Coronavirus 2 by RT PCR NEGATIVE NEGATIVE Final    Comment: (NOTE) SARS-CoV-2 target nucleic acids are NOT DETECTED.  The SARS-CoV-2 RNA is generally detectable in upper  respiratory specimens during the acute phase of infection. The lowest concentration of SARS-CoV-2 viral copies this assay can detect is 138 copies/mL. A negative result does not preclude SARS-Cov-2 infection and should not be used as the sole basis for treatment or other patient management decisions. A negative result may occur with  improper specimen collection/handling, submission of specimen other than nasopharyngeal swab, presence of viral mutation(s) within the areas targeted by this assay, and inadequate number of viral copies(<138 copies/mL). A negative result must be combined with clinical observations, patient history, and epidemiological information. The expected result is Negative.  Fact Sheet for Patients:  EntrepreneurPulse.com.au  Fact Sheet for Healthcare Providers:  IncredibleEmployment.be  This test is no t yet approved or cleared by the [place] FDA and  has been authorized for detection and/or diagnosis of SARS-CoV-2 by FDA under an Emergency Use Authorization (EUA). This EUA will remain  in effect (meaning this test can be used) for the duration of the COVID-19 declaration under Section 564(b)(1) of the Act,  21 U.S.C.section 360bbb-3(b)(1), unless the authorization is terminated  or revoked sooner.       Influenza A by PCR NEGATIVE NEGATIVE Final   Influenza B by PCR NEGATIVE NEGATIVE Final    Comment: (NOTE) The Xpert Xpress SARS-CoV-2/FLU/RSV plus assay is intended as an aid in the diagnosis of influenza from Nasopharyngeal swab specimens and should not be used as a sole basis for treatment. Nasal washings and aspirates are unacceptable for Xpert Xpress SARS-CoV-2/FLU/RSV testing.  Fact Sheet for Patients: EntrepreneurPulse.com.au  Fact Sheet for Healthcare Providers: IncredibleEmployment.be  This test is not yet approved or cleared by the [place] FDA and has been authorized for detection and/or diagnosis of SARS-CoV-2 by FDA under an Emergency Use Authorization (EUA). This EUA will remain in effect (meaning this test can be used) for the duration of the COVID-19 declaration under Section 564(b)(1) of the Act, 21 U.S.C. section 360bbb-3(b)(1), unless the authorization is terminated or revoked.  Performed at [facility], [place] [address] [place], [place] [address]     Radiology Studies: CT ABDOMEN PELVIS WO CONTRAST  Result Date: 08/25/2020 CLINICAL DATA:  Abdominal pain, constipation, nausea, and decreased oral intake. EXAM: CT ABDOMEN AND PELVIS WITHOUT CONTRAST TECHNIQUE: Multidetector CT imaging of the abdomen and pelvis was performed following the standard protocol without IV contrast. COMPARISON:  11/06/2019 FINDINGS: Lower chest: Lung bases are clear. Mild focal pleural thickening in the left posterior chest wall. Hepatobiliary: No focal liver abnormality is seen. No gallstones, gallbladder wall thickening, or biliary dilatation. Pancreas: Surgical clip or other metallic foreign body demonstrated adjacent to the pancreatic head. Pancreas appears intact. No pancreatic ductal dilatation or inflammatory changes. Spleen: Normal in size  without focal abnormality. Adrenals/Urinary Tract: Adrenal glands are unremarkable. Kidneys are normal, without renal calculi, focal lesion, or hydronephrosis. Bladder is unremarkable. Stomach/Bowel: Stomach, small bowel, and colon are not abnormally distended. Colonic diverticulosis without evidence of diverticulitis. Appendix is not identified. Vascular/Lymphatic: Aortic atherosclerosis. No enlarged abdominal or pelvic lymph nodes. Reproductive: Status post hysterectomy. No adnexal masses. Other: No free air or free fluid in the abdomen. Laxity of the abdominal wall musculature without focal defect. Focal infiltration in the subcutaneous fat over the left flank, possibly contusion or cellulitis. No loculated collections. Musculoskeletal: Degenerative changes in the spine. Vertebral hemangioma at L4. 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 IMPRESSION: No acute process demonstrated in the abdomen or pelvis. No evidence of bowel obstruction or inflammation. Aortic atherosclerosis. Focal infiltration in the  subcutaneous fat over the left flank region, possibly edema, cellulitis, or contusion. No abscess. Electronically Signed   By: Lucienne Capers M.D.   On: 08/25/2020 23:34   DG Chest 1 View  Result Date: 08/25/2020 CLINICAL DATA:  Difficulty breathing.  Leg swelling. EXAM: CHEST  1 VIEW COMPARISON:  Chest x-ray 07/25/2020. FINDINGS: Mediastinum and hilar structures are unremarkable. Heart size normal. Low lung volumes. No focal infiltrate. No pleural effusion or pneumothorax. IMPRESSION: No acute cardiopulmonary disease. Electronically Signed   By: Marcello Moores  Register   On: 08/25/2020 12:53    Scheduled Meds:  atorvastatin  10 mg Oral Daily   famotidine  10 mg Oral QHS   heparin  5,000 Units Subcutaneous Q8H   hydrocortisone cream  1 application Topical BID   insulin aspart  0-6 Units Subcutaneous TID WC   midodrine  15 mg Oral TID WC   mirtazapine  7.5 mg Oral QHS   Continuous Infusions:  sodium  chloride 85 mL/hr at 08/26/20 0112    LOS: 1 day   Time spent: 40 min  Little Ishikawa, DO [facility]  If 7PM-7AM, please contact night-coverage [email or web address]  08/26/2020, 7:59 AM

## 2020-08-26 NOTE — Progress Notes (Signed)
Jeanett Schlein called, (pt approved me to talk to him)stating he and her family members are usually taking care of her home, coming and helping her 5-6 times a week, besides the [place], who come about twice a week.

## 2020-08-27 LAB — GLUCOSE, CAPILLARY
Glucose-Capillary: 97 mg/dL (ref 70–99)
Glucose-Capillary: 191 mg/dL — ABNORMAL HIGH (ref 70–99)
Glucose-Capillary: 137 mg/dL — ABNORMAL HIGH (ref 70–99)
Glucose-Capillary: 115 mg/dL — ABNORMAL HIGH (ref 70–99)

## 2020-08-27 LAB — CBC
HCT: 38.5 % (ref 36.0–46.0)
Hemoglobin: 12.3 g/dL (ref 12.0–15.0)
MCH: 27.6 pg (ref 26.0–34.0)
MCHC: 31.9 g/dL (ref 30.0–36.0)
MCV: 86.3 fL (ref 80.0–100.0)
Platelets: 238 10*3/uL (ref 150–400)
RBC: 4.46 MIL/uL (ref 3.87–5.11)
RDW: 13.9 % (ref 11.5–15.5)
WBC: 9.9 10*3/uL (ref 4.0–10.5)
nRBC: 0 % (ref 0.0–0.2)

## 2020-08-27 LAB — MICROALBUMIN / CREATININE URINE RATIO
Creatinine, Urine: 69.2 mg/dL
Microalb Creat Ratio: 9 mg/g creat (ref 0–29)
Microalb, Ur: 6.3 ug/mL — ABNORMAL HIGH

## 2020-08-27 LAB — UREA NITROGEN, URINE: Urea Nitrogen, Ur: 522 mg/dL

## 2020-08-27 LAB — BASIC METABOLIC PANEL
Anion gap: 17 — ABNORMAL HIGH (ref 5–15)
BUN: 157 mg/dL — ABNORMAL HIGH (ref 8–23)
CO2: 31 mmol/L (ref 22–32)
Calcium: 10.9 mg/dL — ABNORMAL HIGH (ref 8.9–10.3)
Chloride: 92 mmol/L — ABNORMAL LOW (ref 98–111)
Creatinine, Ser: 3.03 mg/dL — ABNORMAL HIGH (ref 0.44–1.00)
GFR, Estimated: 16 mL/min — ABNORMAL LOW (ref >60)
Glucose, Bld: 99 mg/dL (ref 70–99)
Potassium: 2.9 mmol/L — ABNORMAL LOW (ref 3.5–5.1)
Sodium: 140 mmol/L (ref 135–145)

## 2020-08-27 MED ORDER — POTASSIUM CHLORIDE CRYS ER 20 MEQ PO TBCR
20.0000 meq | EXTENDED_RELEASE_TABLET | Freq: Three times a day (TID) | ORAL | Status: AC
  Administered 2020-08-27 (×2): 20 meq via ORAL
  Filled 2020-08-27 (×2): qty 1

## 2020-08-27 NOTE — TOC Initial Note (Signed)
Transition of Care [facility]) - Initial/Assessment Note    Patient Details  Name: [name] [name] MRN: [id number] Date of Birth: 12/04/1955  Transition of Care [facility]) CM/SW Contact:    Joanne Chars, LCSW Phone Number: 08/27/2020, 2:20 PM  Clinical Narrative:   CSW met with pt regarding SNF recommendation.  Pt does not want to go to SNF, would be willing to work with [facility].  Pt lives alone and has current [facility] aide services through [facility], which was set up by the [place].  Pt is not sure if [facility] can provide PT/OT.  Contact at [employer] is [place], [phone].  Permission given to speak with two sisters: Isaac Bliss [facility]?) and Enid Derry [phone].  Pt [facility], not sure name of PCP.  Current DME in home: walker, hoyer lift, bedside commode.  Pt is vaccinated for covid with booster.  CSW spoke with Eddie Dibbles at [facility].  They provide [place] aide only, no PT/OT.  CSW received message from April at [place]: PCP at [facility] is [place], CSW is [place], [place], pager [phone].     CSW LM with Cyndee Brightly regarding need for [facility].                   Expected Discharge Plan: [place] Barriers to Discharge: Continued Medical Work up   Patient Goals and CMS Choice Patient states their goals for this hospitalization and ongoing recovery are:: feel better, no nausea CMS Medicare.gov Compare Post Acute Care list provided to::  ([facility] will be providing from their list of providers)    Expected Discharge Plan and Services Expected Discharge Plan: [place] In-house Referral: Clinical Social Work   Post Acute Care Choice: [place] arrangements for the past 2 months: [place]                                      Prior Living Arrangements/Services Living arrangements for the past 2 months: Single Family Home Lives with:: Self Patient language and need for interpreter reviewed:: Yes Do you  feel safe going back to the place where you live?: Yes      Need for Family Participation in Patient Care: No (Comment) Care giver support system in place?: Yes (comment) Current home services: Homehealth aide ([place]) Criminal Activity/Legal Involvement Pertinent to Current Situation/Hospitalization: No - Comment as needed  Activities of Daily Living Home Assistive Devices/Equipment: [employer] fast streamer, Dentures (specify type), Eyeglasses, Wheelchair, Environmental consultant (specify type), Other (Comment), Bedside commode/3-in-1 ADL Screening (condition at time of admission) Patient's cognitive ability adequate to safely complete daily activities?: No Is the patient deaf or have difficulty hearing?: No Does the patient have difficulty seeing, even when wearing glasses/contacts?: Yes Does the patient have difficulty concentrating, remembering, or making decisions?: No Patient able to express need for assistance with ADLs?: Yes Does the patient have difficulty dressing or bathing?: Yes Independently performs ADLs?: No Communication: Independent Dressing (OT): Needs assistance Is this a change from baseline?: Pre-admission baseline Grooming: Needs assistance Is this a change from baseline?: Pre-admission baseline Feeding: Independent Bathing: Needs assistance Is this a change from baseline?: Pre-admission baseline Toileting: Needs assistance Is this a change from baseline?: Pre-admission baseline In/Out Bed: Needs assistance Is this a change from baseline?: Pre-admission baseline Walks in Home: Needs assistance, Dependent (bedbound) Is this a change from baseline?: Pre-admission  baseline Does the patient have difficulty walking or climbing stairs?: Yes Weakness of Legs: Both Weakness of Arms/Hands: Both  Permission Sought/Granted Permission sought to share information with : Family Supports Permission granted to share information with : Yes, Verbal Permission Granted  Share Information  with NAME: sister Hassan Buckler, sister Essie  Permission granted to share info w AGENCY: [facility]        Emotional Assessment Appearance:: Appears stated age Attitude/Demeanor/Rapport: Engaged Affect (typically observed): Appropriate, Pleasant Orientation: : Oriented to Self, Oriented to Place, Oriented to  Time, Oriented to Situation Alcohol / Substance Use: Not Applicable Psych Involvement: No (comment)  Admission diagnosis:  Failure to thrive in adult [R62.7] Constipation, unspecified constipation type [K59.00] Intractable vomiting with nausea, unspecified vomiting type [R11.2] Acute renal failure superimposed on stage 4 chronic kidney disease, unspecified acute renal failure type ([place]) [N17.9, N18.4] Acute renal failure superimposed on stage 3a chronic kidney disease, unspecified acute renal failure type ([place]) [N17.9, N18.31] Patient Active Problem List   Diagnosis Date Noted   Physical debility 08/26/2020   OSA on CPAP 08/26/2020   Constipation    Acute renal failure superimposed on stage 3a chronic kidney disease, unspecified acute renal failure type ([place]) 08/25/2020   Acute renal failure superimposed on stage 3a chronic kidney disease ([place]) 07/30/2020   Lower extremity cellulitis 02/04/2020   Chronic diastolic CHF (congestive heart failure) ([place]) 11/23/2019   Vaginal discharge 11/06/2019   Morbid obesity ([place]) 08/22/2019   Opiate overdose ([place]) 08/20/2019   Cellulitis 10/05/2018   Acute cystitis without hematuria 10/05/2018   Essential hypertension 10/05/2018   Pressure injury of skin 10/05/2018   Hypoglycemia secondary to sulfonylurea 11/21/2016   Substance induced mood disorder ([place]) 07/02/2014   Encephalopathy    Hypokalemia    Type 2 diabetes mellitus with stage 3 chronic kidney disease ([place])    HLD (hyperlipidemia)    Depression    Anxiety state    PCP:  Pcp, No Pharmacy:   [place], [place] - [place] [address] [place] [address] Phone: [phone] Fax: [phone]     Social Determinants of Health (SDOH) Interventions    Readmission Risk Interventions Readmission Risk Prevention Plan 02/06/2020 11/08/2019  Transportation Screening Complete Complete  Medication Review Press photographer) - Referral to Pharmacy  PCP or Specialist appointment within 3-5 days of discharge Complete Complete  HRI or Home Care Consult - Complete  SW Recovery Care/Counseling Consult Complete Complete  Palliative Care Screening Not Applicable Not Applicable  Skilled Nursing Facility Complete Complete  Some recent data might be hidden

## 2020-08-27 NOTE — Progress Notes (Signed)
PROGRESS NOTE    MORGEN LINEBAUGH  ZHY:[id number] DOB: 1955-08-22 DOA: 08/25/2020 PCP: Merryl Hacker, No   Brief Narrative:  [name] [name] is a 65 y.o. female with medical history significant for type 2 diabetes mellitus, chronic diastolic CHF, OSA, depression, anxiety, chronic kidney disease stage IIIa, and BMI of 59, now presenting to the emergency department for evaluation of progressive general weakness and loss of appetite. Patient was admitted to the hospital 1 month ago with acute on chronic diastolic CHF and hyperkalemia, was diuresed, had some worsening in her renal function, was not felt to be a dialysis candidate due to her bedbound status, was advised to discharge to rehab facility, but refused and went home where she has become progressively weak in general and has worsening appetite and some nausea without abdominal pain, vomiting, or diarrhea.  Patient reports that she lives alone, is bedbound, but has someone helping her during the day.    Assessment & Plan:   Principal Problem:   Acute renal failure superimposed on stage 3a chronic kidney disease, unspecified acute renal failure type (HCC) Active Problems:   Type 2 diabetes mellitus with stage 3 chronic kidney disease (HCC)   Depression   Anxiety state   Pressure injury of skin   Chronic diastolic CHF (congestive heart failure) (HCC)   Physical debility   OSA on CPAP  AKI superimposed on CKD IIIa, resolving - Presents with progressive general weakness and poor appetite and is found to have BUN 161 and SCr 3.31 (up from 89 & 2.55 on 08/06/20 and 39 & 1.25 on 07/25/20)  - Likely secondary to poor p.o. intake, initiate clear liquid diet, advance as tolerated -Hold IV fluids in setting of heart failure - Hold Lasix and chlorthalidone while kidney function is resolving  Chronic diastolic CHF - Difficult to determine fluid-status clinically due to morbidly obese body habitus - EF was preserved on TTE one month ago  -Follow daily  weights I's and O's, holding diuretics to give fluids as above given AKI  Acute on chronic debility  - Patient is bed bound, lives alone but has someone helping her during the day, reports insidiously worsening general weakness and difficulty with transfers  - She previously refused SNF placement at recent hospital, lengthy discussion today about unsafe disposition home -We will continue PT OT, continue recommendations for SNF given she is essentially bedbound due to weight and poor physical activity rather than overt neurological or musculoskeletal issue.   Sacral decubitus ulcer, right sided, POA  Inferior pannus ulcer -Wound care to follow -appreciate insight recommendations  depression, anxiety  - Continue Remeron, as-needed Vistaril     OSA  - Continue CPAP qHS     Type II DM  - A1c was 5.3% in June 2022  - Check CBGs, use low-intensity SSI if needed     Elevated troponin - Mild troponin elevation noted in ED - Troponin is decreasing, patient denies chest pain or SOB, does not have any acute ischemic changes on EKG, and no further workup indicated at this time     DVT prophylaxis: sq heparin  Code Status: Full, confirmed on admission  Family Communication: None present  Status is: Inpatient  Dispo: The patient is from: Home              Anticipated d/c is to: To be determined              Anticipated d/c date is: 48 to 72 hours  Patient currently not medically stable for discharge  Consultants:  None  Procedures:  None  Antimicrobials:  None indicated  Subjective: No acute issues or events overnight, denies any nausea vomiting diarrhea constipation over the past 24 hours.  Objective: Vitals:   08/26/20 2126 08/27/20 0327 08/27/20 0500 08/27/20 0818  BP: (!) 132/45 (!) 141/61  (!) 129/52  Pulse: 100 92  94  Resp: 20 20  18   Temp: 98.3 F (36.8 C) 97.7 F (36.5 C)  98.6 F (37 C)  TempSrc: Oral Oral  Oral  SpO2: 100% 100%  100%  Weight:    129.7 kg   Height:        Intake/Output Summary (Last 24 hours) at 08/27/2020 0847 Last data filed at 08/26/2020 1606 Gross per 24 hour  Intake 994 ml  Output 0 ml  Net 994 ml    Filed Weights   08/26/20 1605 08/27/20 0500  Weight: (!) 145.7 kg 129.7 kg    Examination:  General:  Pleasantly resting in bed, No acute distress. HEENT:  Normocephalic atraumatic.  Sclerae nonicteric, noninjected.  Extraocular movements intact bilaterally. Neck:  Without mass or deformity.  Trachea is midline. Lungs: Distant but clear without rhonchi rales Heart:  Regular rate and rhythm.  Without murmurs, rubs, or gallops. Abdomen:  Soft, nontender, nondistended.  Without guarding or rebound. Extremities: Without cyanosis, clubbing, edema, or obvious deformity. Vascular:  Dorsalis pedis and posterior tibial pulses palpable bilaterally. Skin:  Warm and dry, no erythema, no ulcerations.Right pannus ulcer 4 cm red base, left posterior sacral decubitus ulcer 5 cm with granulation tissue.  Data Reviewed: I have personally reviewed following labs and imaging studies  CBC: Recent Labs  Lab 08/25/20 1800 08/26/20 0500 08/27/20 0347  WBC 11.7* 10.9* 9.9  NEUTROABS 8.2*  --   --   HGB 13.8 13.8 12.3  HCT 44.4 45.0 38.5  MCV 88.1 90.4 86.3  PLT 397 358 297    Basic Metabolic Panel: Recent Labs  Lab 08/25/20 1800 08/26/20 0500 08/27/20 0347  NA 133* 137 140  K 3.5 3.0* 2.9*  CL 83* 90* 92*  CO2 28 24 31   GLUCOSE 123* 127* 99  BUN 161* 163* 157*  CREATININE 3.31* 3.27* 3.03*  CALCIUM 11.3* 11.0* 10.9*  MG  --  2.6*  --   PHOS  --  4.4  --     GFR: Estimated Creatinine Clearance: 23.3 mL/min (A) (by C-G formula based on SCr of 3.03 mg/dL (H)). Liver Function Tests: Recent Labs  Lab 08/25/20 1800 08/26/20 0500  AST 27 22  ALT 14 11  ALKPHOS 80 75  BILITOT 1.5* 1.4*  PROT 9.0* 8.4*  ALBUMIN 4.9 4.8    No results for input(s): LIPASE, AMYLASE in the last 168 hours. No results for  input(s): AMMONIA in the last 168 hours. Coagulation Profile: No results for input(s): INR, PROTIME in the last 168 hours. Cardiac Enzymes: Recent Labs  Lab 08/26/20 0500  CKTOTAL 43    BNP (last 3 results) No results for input(s): PROBNP in the last 8760 hours. HbA1C: No results for input(s): HGBA1C in the last 72 hours. CBG: Recent Labs  Lab 08/26/20 0818 08/26/20 1345 08/26/20 1608 08/26/20 2132 08/27/20 0811  GLUCAP 135* 118* 107* 137* 97   Lipid Profile: No results for input(s): CHOL, HDL, LDLCALC, TRIG, CHOLHDL, LDLDIRECT in the last 72 hours. Thyroid Function Tests: No results for input(s): TSH, T4TOTAL, FREET4, T3FREE, THYROIDAB in the last 72 hours. Anemia Panel: No results  for input(s): VITAMINB12, FOLATE, FERRITIN, TIBC, IRON, RETICCTPCT in the last 72 hours. Sepsis Labs: No results for input(s): PROCALCITON, LATICACIDVEN in the last 168 hours.  Recent Results (from the past 240 hour(s))  Resp Panel by RT-PCR (Flu A&B, Covid) Nasopharyngeal Swab     Status: None   Collection Time: 08/25/20 10:23 PM   Specimen: Nasopharyngeal Swab; Nasopharyngeal(NP) swabs in vial transport medium  Result Value Ref Range Status   SARS Coronavirus 2 by RT PCR NEGATIVE NEGATIVE Final    Comment: (NOTE) SARS-CoV-2 target nucleic acids are NOT DETECTED.  The SARS-CoV-2 RNA is generally detectable in upper respiratory specimens during the acute phase of infection. The lowest concentration of SARS-CoV-2 viral copies this assay can detect is 138 copies/mL. A negative result does not preclude SARS-Cov-2 infection and should not be used as the sole basis for treatment or other patient management decisions. A negative result may occur with  improper specimen collection/handling, submission of specimen other than nasopharyngeal swab, presence of viral mutation(s) within the areas targeted by this assay, and inadequate number of viral copies(<138 copies/mL). A negative result must be  combined with clinical observations, patient history, and epidemiological information. The expected result is Negative.  Fact Sheet for Patients:  EntrepreneurPulse.com.au  Fact Sheet for Healthcare Providers:  IncredibleEmployment.be  This test is no t yet approved or cleared by the [place] FDA and  has been authorized for detection and/or diagnosis of SARS-CoV-2 by FDA under an Emergency Use Authorization (EUA). This EUA will remain  in effect (meaning this test can be used) for the duration of the COVID-19 declaration under Section 564(b)(1) of the Act, 21 U.S.C.section 360bbb-3(b)(1), unless the authorization is terminated  or revoked sooner.       Influenza A by PCR NEGATIVE NEGATIVE Final   Influenza B by PCR NEGATIVE NEGATIVE Final    Comment: (NOTE) The Xpert Xpress SARS-CoV-2/FLU/RSV plus assay is intended as an aid in the diagnosis of influenza from Nasopharyngeal swab specimens and should not be used as a sole basis for treatment. Nasal washings and aspirates are unacceptable for Xpert Xpress SARS-CoV-2/FLU/RSV testing.  Fact Sheet for Patients: EntrepreneurPulse.com.au  Fact Sheet for Healthcare Providers: IncredibleEmployment.be  This test is not yet approved or cleared by the [place] FDA and has been authorized for detection and/or diagnosis of SARS-CoV-2 by FDA under an Emergency Use Authorization (EUA). This EUA will remain in effect (meaning this test can be used) for the duration of the COVID-19 declaration under Section 564(b)(1) of the Act, 21 U.S.C. section 360bbb-3(b)(1), unless the authorization is terminated or revoked.  Performed at [facility], [place] [address], [place], [place] [address]      Radiology Studies: CT ABDOMEN PELVIS WO CONTRAST  Result Date: 08/25/2020 CLINICAL DATA:  Abdominal pain, constipation, nausea, and decreased oral intake. EXAM: CT  ABDOMEN AND PELVIS WITHOUT CONTRAST TECHNIQUE: Multidetector CT imaging of the abdomen and pelvis was performed following the standard protocol without IV contrast. COMPARISON:  11/06/2019 FINDINGS: Lower chest: Lung bases are clear. Mild focal pleural thickening in the left posterior chest wall. Hepatobiliary: No focal liver abnormality is seen. No gallstones, gallbladder wall thickening, or biliary dilatation. Pancreas: Surgical clip or other metallic foreign body demonstrated adjacent to the pancreatic head. Pancreas appears intact. No pancreatic ductal dilatation or inflammatory changes. Spleen: Normal in size without focal abnormality. Adrenals/Urinary Tract: Adrenal glands are unremarkable. Kidneys are normal, without renal calculi, focal lesion, or hydronephrosis. Bladder is unremarkable. Stomach/Bowel: Stomach, small bowel,  and colon are not abnormally distended. Colonic diverticulosis without evidence of diverticulitis. Appendix is not identified. Vascular/Lymphatic: Aortic atherosclerosis. No enlarged abdominal or pelvic lymph nodes. Reproductive: Status post hysterectomy. No adnexal masses. Other: No free air or free fluid in the abdomen. Laxity of the abdominal wall musculature without focal defect. Focal infiltration in the subcutaneous fat over the left flank, possibly contusion or cellulitis. No loculated collections. Musculoskeletal: Degenerative changes in the spine. Vertebral hemangioma at L4. 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 IMPRESSION: No acute process demonstrated in the abdomen or pelvis. No evidence of bowel obstruction or inflammation. Aortic atherosclerosis. Focal infiltration in the subcutaneous fat over the left flank region, possibly edema, cellulitis, or contusion. No abscess. Electronically Signed   By: Lucienne Capers M.D.   On: 08/25/2020 23:34   DG Chest 1 View  Result Date: 08/25/2020 CLINICAL DATA:  Difficulty breathing.  Leg swelling. EXAM: CHEST  1 VIEW  COMPARISON:  Chest x-ray 07/25/2020. FINDINGS: Mediastinum and hilar structures are unremarkable. Heart size normal. Low lung volumes. No focal infiltrate. No pleural effusion or pneumothorax. IMPRESSION: No acute cardiopulmonary disease. Electronically Signed   By: Marcello Moores  Register   On: 08/25/2020 12:53    Scheduled Meds:  atorvastatin  10 mg Oral Daily   famotidine  10 mg Oral QHS   heparin  5,000 Units Subcutaneous Q8H   hydrocortisone cream  1 application Topical BID   insulin aspart  0-6 Units Subcutaneous TID WC   mouth rinse  15 mL Mouth Rinse BID   midodrine  15 mg Oral TID WC   mirtazapine  7.5 mg Oral QHS   Continuous Infusions:    LOS: 2 days   Time spent: 40 min  Little Ishikawa, DO [facility]  If 7PM-7AM, please contact night-coverage [email or web address]  08/27/2020, 8:47 AM

## 2020-08-27 NOTE — Evaluation (Signed)
Occupational Therapy Evaluation Patient Details Name: [name] [name] MRN: [id number] DOB: 08-May-1955 Today's Date: 08/27/2020    History of Present Illness 65 y.o. female presents to [facility] ED on 08/25/2020 with reports of generalized weakness and loss of apetite. Pt recently admitted one month ago with CHF exacerbation, was recommended to discharge to SNF but refused and discharged home. Pt admitted for management of AKI, CHF, sacral ulcers, and acute on chronic debility. PMH includes type 2 diabetes mellitus, chronic diastolic CHF, OSA, depression, anxiety, chronic kidney disease stage III.   Clinical Impression   Patient admitted for the diagnosis above.  Today she is complaining of nausea and an upset stomach.  PTA she lived alone in an apartment with near 24 hour care for all ADL and mobility, except light grooming and self feeding.  She appears to currently be bed bound, and barriers to function are listed below.  The patient continues to refuse SNF admission, she wants to return home with prior level of supports.  If her PCA's and friend can continue to provide near dependent care, home is a consideration, but long term care at a SNF is more appropriate.  She is close to her baseline with ADL at bed level, OT encourages upper body ROM from nurse techs during ADL completion.  No further acute OT needs.      Follow Up Recommendations  No OT follow up    Equipment Recommendations  Other (comment) (Power wheelchair not an Transport planner)    Recommendations for Other Services       Precautions / Restrictions Precautions Precautions: Fall;Other (comment) Precaution Comments: Skin integrity and contractures Restrictions Weight Bearing Restrictions: No      Mobility Bed Mobility   Bed Mobility: Rolling Rolling: Mod assist;Max assist         General bed mobility comments: patient rolled to R to reposition on opposite side for pressure relief. Patient Response: Flat  affect  Transfers                 General transfer comment: NT    Balance                                           ADL either performed or assessed with clinical judgement   ADL Overall ADL's : At baseline                                       General ADL Comments: Total assist except for grooming and self feeding     Vision Patient Visual Report: No change from baseline       Perception  NT   Praxis  NT    Pertinent Vitals/Pain Pain Assessment: Faces Faces Pain Scale: Hurts little more Pain Location: R shoulder with pressure - prior fracture to shoulder with what sounds like a subsequent infection. Pain Descriptors / Indicators: Tender;Sharp Pain Intervention(s): Monitored during session     Hand Dominance Right   Extremity/Trunk Assessment Upper Extremity Assessment Upper Extremity Assessment: Generalized weakness RUE Deficits / Details: limited shoulder flexion LUE Deficits / Details: limited shoulder flexion   Lower Extremity Assessment Lower Extremity Assessment: Defer to PT evaluation LLE Deficits / Details: L leg externally rotated, patient states prior fracture that was non operative  Communication Communication Communication: No difficulties   Cognition Arousal/Alertness: Awake/alert Behavior During Therapy: WFL for tasks assessed/performed Overall Cognitive Status: Within Functional Limits for tasks assessed                                     General Comments  bed level eval    Exercises     Shoulder Instructions      Home Living Family/patient expects to be discharged to:: Private residence Living Arrangements: Alone Available Help at Discharge: Friend(s);Personal care attendant Type of Home: Apartment Home Access: Ramped entrance     Home Layout: One level     Bathroom Shower/Tub: Occupational psychologist: Standard Bathroom Accessibility: No   Home  Equipment: Hospital bed;Walker - 4 wheels;Wheelchair - Education administrator (comment);Shower seat   Additional Comments: Patient states her current bed is being switched out for a bariatric bed.  she states she has been approved for an Transport planner      Prior Functioning/Environment Level of Independence: Needs assistance  Gait / Transfers Assistance Needed: Bed bound ADL's / Homemaking Assistance Needed: Pt requires assistance for all ADLs which are performed at bed level.  She needs assist with meals, meds, and community mobility.            OT Problem List: Decreased strength;Decreased range of motion;Decreased activity tolerance;Obesity;Pain      OT Treatment/Interventions:      OT Goals(Current goals can be found in the care plan section) Acute Rehab OT Goals Patient Stated Goal: be able to move and feel better OT Goal Formulation: With patient Time For Goal Achievement: 08/27/20 Potential to Achieve Goals: Fair  OT Frequency:     Barriers to D/C:  Medical condition and increasing dependency          Co-evaluation              AM-PAC OT "6 Clicks" Daily Activity     Outcome Measure Help from another person eating meals?: A Little Help from another person taking care of personal grooming?: A Little Help from another person toileting, which includes using toliet, bedpan, or urinal?: Total Help from another person bathing (including washing, rinsing, drying)?: Total Help from another person to put on and taking off regular upper body clothing?: A Lot Help from another person to put on and taking off regular lower body clothing?: Total 6 Click Score: 11   End of Session    Activity Tolerance: Patient tolerated treatment well Patient left: in bed;with call bell/phone within reach;with bed alarm set;with family/visitor present  OT Visit Diagnosis: Muscle weakness (generalized) (M62.81);Pain Pain - Right/Left: Right Pain - part of body: Shoulder                 Time: 0814-4818 OT Time Calculation (min): 18 min Charges:  OT General Charges $OT Visit: 1 Visit OT Evaluation $OT Eval Moderate Complexity: 1 Mod  08/27/2020  Rich, OTR/L  Acute Rehabilitation Services  Office:  [phone]   Metta Clines 08/27/2020, 4:07 PM

## 2020-08-27 NOTE — Progress Notes (Signed)
Patient refused cpap. No unit in room.

## 2020-08-27 NOTE — Consult Note (Signed)
[place] Nurse Consult Note: Patient receiving care in [facility] 2W12 This patient is morbidly obese Reason for Consult:Inguinal, left flank, sacrum and pannus wounds Wound type: Full thickness wounds under the right pannus and on the left hip. Both wounds have improved from last photos taken 07/16/20 Pressure Injury POA: NA Measurement: The pannus wound is 4.5 x 3 x 0.7, clean moist pink granulation tissue Left hip wound is 3.5 x 2 x 0.8, clean moist pink granulation tissue.  The pannus folds are malodorous and moist.  No wounds found in the sacral area.  Dressing procedure/placement/frequency: For both full thickness wounds, apply saline moistened gauze into the wounds, cover with dry gauze and secure with foam dressing or ABD pads and medipore tape.  Thoroughly clean all skin folds with soap and water, rinse and thoroughly dry, then place InterDry Ag+ in the skin folds Measure and cut length of InterDry to fit in skin folds that have skin breakdown Tuck InterDry fabric into skin folds in a single layer, allow for 2 inches of overhang from skin edges to allow for wicking to occur May remove to bathe; dry area thoroughly and then tuck into affected areas again DO NOT APPLY any creams or ointments when using InterDry DO NOT THROW AWAY FOR 5 DAYS unless soiled with stool DO NOT [facility] product, this will inactivate the silver in the material  New sheet of Interdry should be applied after 5 days of use if patient continues to have skin breakdown [name] [phone]    Monitor the wound area(s) for worsening of condition such as: Signs/symptoms of infection, increase in size, development of or worsening of odor, development of pain, or increased pain at the affected locations.   Notify the medical team if any of these develop.  Thank you for the consult. [place] nurse will not follow at this time.   Please re-consult the [place] team if needed.  Cathlean Marseilles Tamala Julian, MSN, RN, [place], Lysle Pearl, [facility] Pager [phone]

## 2020-08-28 LAB — CBC
HCT: 41.1 % (ref 36.0–46.0)
Hemoglobin: 12.4 g/dL (ref 12.0–15.0)
MCH: 27.1 pg (ref 26.0–34.0)
MCHC: 30.2 g/dL (ref 30.0–36.0)
MCV: 89.7 fL (ref 80.0–100.0)
Platelets: 322 10*3/uL (ref 150–400)
RBC: 4.58 MIL/uL (ref 3.87–5.11)
RDW: 14.3 % (ref 11.5–15.5)
WBC: 10.1 10*3/uL (ref 4.0–10.5)
nRBC: 0 % (ref 0.0–0.2)

## 2020-08-28 LAB — GLUCOSE, CAPILLARY
Glucose-Capillary: 115 mg/dL — ABNORMAL HIGH (ref 70–99)
Glucose-Capillary: 176 mg/dL — ABNORMAL HIGH (ref 70–99)
Glucose-Capillary: 143 mg/dL — ABNORMAL HIGH (ref 70–99)
Glucose-Capillary: 125 mg/dL — ABNORMAL HIGH (ref 70–99)

## 2020-08-28 LAB — BASIC METABOLIC PANEL
Anion gap: 14 (ref 5–15)
BUN: 151 mg/dL — ABNORMAL HIGH (ref 8–23)
CO2: 33 mmol/L — ABNORMAL HIGH (ref 22–32)
Calcium: 11.2 mg/dL — ABNORMAL HIGH (ref 8.9–10.3)
Chloride: 92 mmol/L — ABNORMAL LOW (ref 98–111)
Creatinine, Ser: 2.9 mg/dL — ABNORMAL HIGH (ref 0.44–1.00)
GFR, Estimated: 17 mL/min — ABNORMAL LOW (ref >60)
Glucose, Bld: 105 mg/dL — ABNORMAL HIGH (ref 70–99)
Potassium: 2.8 mmol/L — ABNORMAL LOW (ref 3.5–5.1)
Sodium: 139 mmol/L (ref 135–145)

## 2020-08-28 MED ORDER — ONDANSETRON HCL 4 MG PO TABS: 4.0000 mg | ORAL_TABLET | Freq: Four times a day (QID) | ORAL | 0 refills | Status: DC | PRN

## 2020-08-28 MED ORDER — HYDROXYZINE HCL 25 MG PO TABS: 25.0000 mg | ORAL_TABLET | Freq: Two times a day (BID) | ORAL | 0 refills | Status: DC | PRN

## 2020-08-28 MED ORDER — PANTOPRAZOLE SODIUM 40 MG IV SOLR
40.0000 mg | Freq: Two times a day (BID) | INTRAVENOUS | Status: DC
  Administered 2020-08-28: 40 mg via INTRAVENOUS
  Filled 2020-08-28 (×2): qty 40

## 2020-08-28 MED ORDER — ALUM & MAG HYDROXIDE-SIMETH 200-200-20 MG/5ML PO SUSP
30.0000 mL | Freq: Once | ORAL | Status: AC
  Administered 2020-08-28: 30 mL via ORAL
  Filled 2020-08-28: qty 30

## 2020-08-28 MED ORDER — OMEPRAZOLE MAGNESIUM 20 MG PO TBEC: 40.0000 mg | DELAYED_RELEASE_TABLET | Freq: Every day | ORAL | 1 refills | Status: DC

## 2020-08-28 MED ORDER — LIDOCAINE VISCOUS HCL 2 % MT SOLN
15.0000 mL | Freq: Once | OROMUCOSAL | Status: AC
  Administered 2020-08-28: 15 mL via ORAL
  Filled 2020-08-28: qty 15

## 2020-08-28 MED ORDER — MIRTAZAPINE 7.5 MG PO TABS: 7.5000 mg | ORAL_TABLET | Freq: Every day | ORAL | 0 refills | Status: DC

## 2020-08-28 MED ORDER — POTASSIUM CHLORIDE CRYS ER 20 MEQ PO TBCR
40.0000 meq | EXTENDED_RELEASE_TABLET | Freq: Two times a day (BID) | ORAL | Status: AC
  Administered 2020-08-28 (×2): 40 meq via ORAL
  Filled 2020-08-28 (×2): qty 2

## 2020-08-28 NOTE — Progress Notes (Signed)
Patient verbalized understanding of DC teaching including medication regimen and pickup as well as follow up appointments. Belongings gathered and placed in patient belonging bag. Waiting for PTAR for ride.

## 2020-08-28 NOTE — Progress Notes (Signed)
   08/28/20 1158  Clinical Encounter Type  Visited With Patient  Visit Type Spiritual support  Referral From Nurse  Consult/Referral To Chaplain  Spiritual Encounters  Spiritual Needs Emotional;Prayer  Stress Factors  Patient Stress Factors Lack of knowledge;Major life changes (Not understanding what is causing her to be sick.)  Chaplain responded to consult for [name] [name]. Discussed her health issues and frustrations.  Offered prayer and comfort.     Chaplain Leilanni Halvorson [place]  [phone]

## 2020-08-28 NOTE — TOC Progression Note (Addendum)
Transition of Care [facility]) - Progression Note    Patient Details  Name: [name] [name] MRN: [id number] Date of Birth: [birth year]  [facility] CM/SW Contact  Joanne Chars, LCSW Phone Number: 08/28/2020, 2:16 PM  Clinical Narrative:  CSW received message from [place] confirming they can pay for [facility], just need to know the agency.  CSW left message with Delana Meyer regarding [facility] and [facility]/HH aide services.  Hospital discharge appt arranged [facility]--they have to contact pt and are not able to give appt to [place].    CSW spoke with Eddie Dibbles at [employer].  He will reach out to pt tomorrow regarding resumption of services.      Expected Discharge Plan: [place] Barriers to Discharge: Continued Medical Work up  Expected Discharge Plan and Services Expected Discharge Plan: [place] In-house Referral: Clinical Social Work   Post Acute Care Choice: [place] arrangements for the past 2 months: [place]: PT [place]: [place] ([place]) Date [place]: 08/28/20 Time [place]: 1100 Representative spoke with at [place]: [place] ([place]) Interventions    Readmission Risk Interventions Readmission Risk Prevention Plan 02/06/2020 11/08/2019  Transportation Screening Complete Complete  Medication Review [employer]) - Referral to Pharmacy  PCP or Specialist appointment within 3-5 days of discharge Complete Complete  HRI or [place] - Complete  SW Recovery Care/Counseling Consult Complete Complete  Palliative Care Screening Not Applicable Not Applicable  Skilled Nursing Facility Complete Complete  Some recent data might be hidden

## 2020-08-28 NOTE — TOC Transition Note (Signed)
Transition of Care [facility]) - CM/SW Discharge Note   Patient Details  Name: [name] [name] MRN: [id number] Date of Birth: 01-26-1956  Transition of Care [facility]) CM/SW Contact:  Joanne Chars, LCSW Phone Number: 08/28/2020, 3:43 PM   Clinical Narrative:  Pt discharging home with [facility] and with [place] for [facility] aide services.  Both  were notified of DC.  No DME needs.  Pt will transport with PTAR.      Final next level of care: [place] Barriers to Discharge: Barriers Resolved   Patient Goals and CMS Choice Patient states their goals for this hospitalization and ongoing recovery are:: feel better, no nausea CMS Medicare.gov Compare Post Acute Care list provided to::  ([facility] will be providing from their list of providers)    Discharge Placement                Patient to be transferred to facility by: PTAR      Discharge Plan and Services In-house Referral: Clinical Social Work   Post Acute Care Choice: Home Health          DME Arranged: N/A         HH Arranged: PT [place] Agency: [place] ([place]) Date HH Agency Contacted: 08/28/20 Time HH Agency Contacted: 1100 Representative spoke with at [place]: [place] ([place]) Interventions     Readmission Risk Interventions Readmission Risk Prevention Plan 02/06/2020 11/08/2019  Transportation Screening Complete Complete  Medication Review Press photographer) - Referral to Pharmacy  PCP or Specialist appointment within 3-5 days of discharge Complete Complete  HRI or [place] - Complete  SW Recovery Care/Counseling Consult Complete Complete  Palliative Care Screening Not Applicable Not Applicable  Skilled Nursing Facility Complete Complete  Some recent data might be hidden

## 2020-08-28 NOTE — Discharge Summary (Signed)
Physician Discharge Summary  [name] [name] XTG:[id number] DOB: 12-18-1955 DOA: 08/25/2020  PCP: Merryl Hacker, No  Admit date: 08/25/2020 Discharge date: 08/28/2020  Admitted From: Home Disposition: Home with home health  Recommendations for Outpatient Follow-up:  Follow up with PCP in 1-2 weeks Please obtain BMP/CBC in one week  Home Health: PT Equipment/Devices: None  Discharge Condition: Stable CODE STATUS: Full Diet recommendation: Low-salt low-fat diabetic diet  Brief/Interim Summary: [name] [name] is a 65 y.o. female with medical history significant for type 2 diabetes mellitus, chronic diastolic CHF, OSA, depression, anxiety, chronic kidney disease stage IIIa, and BMI of 59, now presenting to the emergency department for evaluation of progressive general weakness and loss of appetite. Patient was admitted to the hospital 1 month ago with acute on chronic diastolic CHF and hyperkalemia, was diuresed, had some worsening in her renal function, was not felt to be a dialysis candidate due to her bedbound status, was advised to discharge to rehab facility, but refused and went home where she has become progressively weak in general and has worsening appetite and some nausea without abdominal pain, vomiting, or diarrhea.  Patient reports that she lives alone, is bedbound, but has someone helping her during the day.  Patient admitted as above with progressive generalized weakness and loss of appetite although she remarks that she generally has a poor appetite despite BMI of 53.  We had a lengthy discussion about her bedbound status and worsening prognosis should she continue to be bedbound due to her weight which will only cause more health issues in the future.  Patient complains that she has been having intractable nausea and vomiting prior to admission however she has not had any vomiting here in greater than 48 hours and while she complains of nausea has been able to take p.o. here quite well  over the past 12 hours.  Concern for GERD/indigestion in the setting of numerous diabetic medications as well as likely poor dietary intake.  We discussed eating low-fat low-salt low-carb diet with close follow-up with PCP to further adjust her home diabetic medications given their known GI side effects.  Patient did improve with GI cocktail and PPI IV as such we will continue patient on a p.o PPI until she can be followed by PCP for further evaluation.  Patient otherwise stable and agreeable for discharge, awaiting transport to be discharged home, PT did recommend ongoing physical therapy yet again at SNF, her last hospitalization earlier this year was also recommended that she discharged to SNF given her profound weakness and bedbound status.  She again denied transfer to SNF despite our ongoing recommendations as such she will be discharged with home health.  Of note patient has numerous skin ulcerations over her anterior and posterior abdomen and sacrum.  Wound care has made recommendations and she should continue outpatient wound care follow-up as scheduled.  Discharge Diagnoses:  Principal Problem:   Acute renal failure superimposed on stage 3a chronic kidney disease, unspecified acute renal failure type (HCC) Active Problems:   Type 2 diabetes mellitus with stage 3 chronic kidney disease (HCC)   Depression   Anxiety state   Pressure injury of skin   Chronic diastolic CHF (congestive heart failure) (HCC)   Physical debility   OSA on CPAP    Discharge Instructions  Discharge Instructions     Call MD for:  persistant nausea and vomiting   Complete by: As directed    Call MD for:  temperature >100.4   Complete by: As  directed    Diet - low sodium heart healthy   Complete by: As directed    Discharge wound care:   Complete by: As directed    Per wound care:   Thoroughly clean all skin folds with soap and water, rinse and thoroughly dry, then place InterDry Ag+ [name] # [phone]) in the  skin folds Measure and cut length of InterDry to fit in skin folds that have skin breakdown Tuck InterDry fabric into skin folds in a single layer, allow for 2 inches of overhang from skin edges to allow for wicking to occur May remove to bathe; dry area thoroughly and then tuck into affected areas again DO NOT APPLY any creams or ointments when using InterDry DO NOT THROW AWAY FOR 5 DAYS unless soiled with stool DO NOT [facility] product, this will inactivate the silver in the material New sheet of Interdry should be applied after 5 days of use if patient continues to have skin breakdown   Increase activity slowly   Complete by: As directed       Allergies as of 08/28/2020       Reactions   Lisinopril Swelling   Morphine Itching, Rash   Penicillins Hives, Itching, Nausea And Vomiting, Rash   Has patient had a PCN reaction causing immediate rash, facial/tongue/throat swelling, SOB or lightheadedness with hypotension: No Has patient had a PCN reaction causing severe rash involving mucus membranes or skin necrosis: No Has patient had a PCN reaction that required hospitalization: No Has patient had a PCN reaction occurring within the last 10 years: No If all of the above answers are "NO", then may proceed with Cephalosporin use.        Medication List     STOP taking these medications    acetaminophen 500 MG tablet Commonly known as: TYLENOL   hydrOXYzine 10 MG tablet Commonly known as: ATARAX/VISTARIL   hydrOXYzine 50 MG capsule Commonly known as: VISTARIL Replaced by: hydrOXYzine 25 MG tablet       TAKE these medications    albuterol 108 (90 Base) MCG/ACT inhaler Commonly known as: VENTOLIN HFA Inhale 2 puffs into the lungs every 6 (six) hours as needed for wheezing or shortness of breath.   Alogliptin Benzoate 12.5 MG Tabs Take 6.25 mg by mouth daily. In addition to pioglitazone   atorvastatin 10 MG tablet Commonly known as: LIPITOR Take 10 mg by mouth daily.    chlorthalidone 25 MG tablet Commonly known as: HYGROTON Take 25 mg by mouth every morning.   empagliflozin 25 MG Tabs tablet Commonly known as: JARDIANCE Take 12.5 mg by mouth daily.   famotidine 20 MG tablet Commonly known as: PEPCID Take 20 mg by mouth at bedtime.   furosemide 80 MG tablet Commonly known as: LASIX Take 1 tablet (80 mg total) by mouth 2 (two) times daily.   hydrocortisone 2.5 % lotion Apply 1 application topically 2 (two) times daily. Face itching   hydrOXYzine 25 MG tablet Commonly known as: ATARAX/VISTARIL Take 1 tablet (25 mg total) by mouth every 12 (twelve) hours as needed for itching or anxiety. Replaces: hydrOXYzine 50 MG capsule   midodrine 5 MG tablet Commonly known as: PROAMATINE Take 3 tablets (15 mg total) by mouth 3 (three) times daily with meals.   mirtazapine 7.5 MG tablet Commonly known as: REMERON Take 1 tablet (7.5 mg total) by mouth at bedtime. What changed:  medication strength how much to take   omeprazole 20 MG tablet Commonly known as: PriLOSEC OTC  Take 2 tablets (40 mg total) by mouth daily.   ondansetron 4 MG tablet Commonly known as: ZOFRAN Take 1 tablet (4 mg total) by mouth every 6 (six) hours as needed for nausea.   pioglitazone 45 MG tablet Commonly known as: ACTOS Take 45 mg by mouth daily.   polyethylene glycol 17 g packet Commonly known as: MIRALAX / GLYCOLAX Take 17 g by mouth daily as needed. What changed: reasons to take this   senna-docusate 8.6-50 MG tablet Commonly known as: Senokot-S Take 1 tablet by mouth daily. Started 11/24/2019   sitaGLIPtin 100 MG tablet Commonly known as: JANUVIA Take 100 mg by mouth daily.               Discharge Care Instructions  (From admission, onward)           Start     Ordered   08/28/20 0000  Discharge wound care:       Comments: Per wound care:   Thoroughly clean all skin folds with soap and water, rinse and thoroughly dry, then place InterDry Ag+  [name] # [phone]) in the skin folds Measure and cut length of InterDry to fit in skin folds that have skin breakdown Tuck InterDry fabric into skin folds in a single layer, allow for 2 inches of overhang from skin edges to allow for wicking to occur May remove to bathe; dry area thoroughly and then tuck into affected areas again DO NOT APPLY any creams or ointments when using InterDry DO NOT THROW AWAY FOR 5 DAYS unless soiled with stool DO NOT [facility] product, this will inactivate the silver in the material New sheet of Interdry should be applied after 5 days of use if patient continues to have skin breakdown   08/28/20 1527            Follow-up Information     [facility], [facility] Follow up.   Why: Dr Cleatrice Burke office will call you to schedule your hospital discharge appointment.  Please call them if you have not heard from them by Tuesday, 7/12. Contact information: [place] [address] [phone]         [facility] Follow up.   Why: [facility] will contact you to schedule your first home visit. Contact information: [address], [place].  P: [phone].        [facility] Follow up.   Why: [facility] will contact you to resume home health aide services. Contact information: P: [phone]               Allergies  Allergen Reactions   Lisinopril Swelling   Morphine Itching and Rash   Penicillins Hives, Itching, Nausea And Vomiting and Rash    Has patient had a PCN reaction causing immediate rash, facial/tongue/throat swelling, SOB or lightheadedness with hypotension: No Has patient had a PCN reaction causing severe rash involving mucus membranes or skin necrosis: No Has patient had a PCN reaction that required hospitalization: No Has patient had a PCN reaction occurring within the last 10 years: No If all of the above answers are "NO", then may  proceed with Cephalosporin use.     Consultations: None   Procedures/Studies: CT ABDOMEN PELVIS WO CONTRAST  Result Date: 08/25/2020 CLINICAL DATA:  Abdominal pain, constipation, nausea, and decreased oral intake. EXAM: CT ABDOMEN AND PELVIS WITHOUT CONTRAST TECHNIQUE: Multidetector CT imaging of the abdomen and pelvis was performed following the standard  protocol without IV contrast. COMPARISON:  11/06/2019 FINDINGS: Lower chest: Lung bases are clear. Mild focal pleural thickening in the left posterior chest wall. Hepatobiliary: No focal liver abnormality is seen. No gallstones, gallbladder wall thickening, or biliary dilatation. Pancreas: Surgical clip or other metallic foreign body demonstrated adjacent to the pancreatic head. Pancreas appears intact. No pancreatic ductal dilatation or inflammatory changes. Spleen: Normal in size without focal abnormality. Adrenals/Urinary Tract: Adrenal glands are unremarkable. Kidneys are normal, without renal calculi, focal lesion, or hydronephrosis. Bladder is unremarkable. Stomach/Bowel: Stomach, small bowel, and colon are not abnormally distended. Colonic diverticulosis without evidence of diverticulitis. Appendix is not identified. Vascular/Lymphatic: Aortic atherosclerosis. No enlarged abdominal or pelvic lymph nodes. Reproductive: Status post hysterectomy. No adnexal masses. Other: No free air or free fluid in the abdomen. Laxity of the abdominal wall musculature without focal defect. Focal infiltration in the subcutaneous fat over the left flank, possibly contusion or cellulitis. No loculated collections. Musculoskeletal: Degenerative changes in the spine. Vertebral hemangioma at L4. 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 IMPRESSION: No acute process demonstrated in the abdomen or pelvis. No evidence of bowel obstruction or inflammation. Aortic atherosclerosis. Focal infiltration in the subcutaneous fat over the left flank region, possibly edema,  cellulitis, or contusion. No abscess. Electronically Signed   By: Lucienne Capers M.D.   On: 08/25/2020 23:34   DG Chest 1 View  Result Date: 08/25/2020 CLINICAL DATA:  Difficulty breathing.  Leg swelling. EXAM: CHEST  1 VIEW COMPARISON:  Chest x-ray 07/25/2020. FINDINGS: Mediastinum and hilar structures are unremarkable. Heart size normal. Low lung volumes. No focal infiltrate. No pleural effusion or pneumothorax. IMPRESSION: No acute cardiopulmonary disease. Electronically Signed   By: Marcello Moores  Register   On: 08/25/2020 12:53     Subjective: No acute issues or events overnight denies vomiting diarrhea constipation headache fevers chills chest pain shortness of breath.    Discharge Exam: Vitals:   08/27/20 2000 08/28/20 0349  BP: (!) 161/49 (!) 136/54  Pulse: (!) 102 85  Resp: 18 18  Temp: 98.4 F (36.9 C) 98.4 F (36.9 C)  SpO2: 94% 100%   Vitals:   08/27/20 1900 08/27/20 2000 08/28/20 0349 08/28/20 0350  BP:  (!) 161/49 (!) 136/54   Pulse: 96 (!) 102 85   Resp: 15 18 18    Temp:  98.4 F (36.9 C) 98.4 F (36.9 C)   TempSrc:  Oral Oral   SpO2: 100% 94% 100%   Weight:    129.9 kg  Height:       General:  Pleasantly resting in bed, No acute distress. HEENT:  Normocephalic atraumatic.  Sclerae nonicteric, noninjected.  Extraocular movements intact bilaterally. Neck:  Without mass or deformity.  Trachea is midline. Lungs: Distant but clear without rhonchi rales Heart:  Regular rate and rhythm.  Without murmurs, rubs, or gallops. Abdomen:  Soft, nontender, nondistended.  Without guarding or rebound. Extremities: Without cyanosis, clubbing, edema, or obvious deformity. Vascular:  Dorsalis pedis and posterior tibial pulses palpable bilaterally. Skin:  Warm and dry, no erythema, no ulcerations.Right pannus ulcer 4 cm red base, left posterior sacral decubitus ulcer 5 cm with granulation tissue    The results of significant diagnostics from this hospitalization (including imaging,  microbiology, ancillary and laboratory) are listed below for reference.     Microbiology: Recent Results (from the past 240 hour(s))  Resp Panel by RT-PCR (Flu A&B, Covid) Nasopharyngeal Swab     Status: None   Collection Time: 08/25/20 10:23 PM   Specimen: Nasopharyngeal Swab;  Nasopharyngeal(NP) swabs in vial transport medium  Result Value Ref Range Status   SARS Coronavirus 2 by RT PCR NEGATIVE NEGATIVE Final    Comment: (NOTE) SARS-CoV-2 target nucleic acids are NOT DETECTED.  The SARS-CoV-2 RNA is generally detectable in upper respiratory specimens during the acute phase of infection. The lowest concentration of SARS-CoV-2 viral copies this assay can detect is 138 copies/mL. A negative result does not preclude SARS-Cov-2 infection and should not be used as the sole basis for treatment or other patient management decisions. A negative result may occur with  improper specimen collection/handling, submission of specimen other than nasopharyngeal swab, presence of viral mutation(s) within the areas targeted by this assay, and inadequate number of viral copies(<138 copies/mL). A negative result must be combined with clinical observations, patient history, and epidemiological information. The expected result is Negative.  Fact Sheet for Patients:  EntrepreneurPulse.com.au  Fact Sheet for Healthcare Providers:  IncredibleEmployment.be  This test is no t yet approved or cleared by the [place] FDA and  has been authorized for detection and/or diagnosis of SARS-CoV-2 by FDA under an Emergency Use Authorization (EUA). This EUA will remain  in effect (meaning this test can be used) for the duration of the COVID-19 declaration under Section 564(b)(1) of the Act, 21 U.S.C.section 360bbb-3(b)(1), unless the authorization is terminated  or revoked sooner.       Influenza A by PCR NEGATIVE NEGATIVE Final   Influenza B by PCR NEGATIVE NEGATIVE  Final    Comment: (NOTE) The Xpert Xpress SARS-CoV-2/FLU/RSV plus assay is intended as an aid in the diagnosis of influenza from Nasopharyngeal swab specimens and should not be used as a sole basis for treatment. Nasal washings and aspirates are unacceptable for Xpert Xpress SARS-CoV-2/FLU/RSV testing.  Fact Sheet for Patients: EntrepreneurPulse.com.au  Fact Sheet for Healthcare Providers: IncredibleEmployment.be  This test is not yet approved or cleared by the [place] FDA and has been authorized for detection and/or diagnosis of SARS-CoV-2 by FDA under an Emergency Use Authorization (EUA). This EUA will remain in effect (meaning this test can be used) for the duration of the COVID-19 declaration under Section 564(b)(1) of the Act, 21 U.S.C. section 360bbb-3(b)(1), unless the authorization is terminated or revoked.  Performed at [facility], [place] [address] [place], [place] [address]      Labs: BNP (last 3 results) Recent Labs    11/22/19 1417 07/25/20 1432 08/25/20 1800  BNP 333.7* 119.6* 51.0   Basic Metabolic Panel: Recent Labs  Lab 08/25/20 1800 08/26/20 0500 08/27/20 0347 08/28/20 0134  NA 133* 137 140 139  K 3.5 3.0* 2.9* 2.8*  CL 83* 90* 92* 92*  CO2 28 24 31  33*  GLUCOSE 123* 127* 99 105*  BUN 161* 163* 157* 151*  CREATININE 3.31* 3.27* 3.03* 2.90*  CALCIUM 11.3* 11.0* 10.9* 11.2*  MG  --  2.6*  --   --   PHOS  --  4.4  --   --    Liver Function Tests: Recent Labs  Lab 08/25/20 1800 08/26/20 0500  AST 27 22  ALT 14 11  ALKPHOS 80 75  BILITOT 1.5* 1.4*  PROT 9.0* 8.4*  ALBUMIN 4.9 4.8   No results for input(s): LIPASE, AMYLASE in the last 168 hours. No results for input(s): AMMONIA in the last 168 hours. CBC: Recent Labs  Lab 08/25/20 1800 08/26/20 0500 08/27/20 0347 08/28/20 0134  WBC 11.7* 10.9* 9.9 10.1  NEUTROABS 8.2*  --   --   --  HGB 13.8 13.8 12.3 12.4  HCT 44.4 45.0 38.5 41.1   MCV 88.1 90.4 86.3 89.7  PLT 397 358 238 322   Cardiac Enzymes: Recent Labs  Lab 08/26/20 0500  CKTOTAL 43   BNP: Invalid input(s): POCBNP CBG: Recent Labs  Lab 08/27/20 1136 08/27/20 1651 08/27/20 2031 08/28/20 0731 08/28/20 1120  GLUCAP 191* 137* 115* 115* 176*   D-Dimer No results for input(s): DDIMER in the last 72 hours. Hgb A1c No results for input(s): HGBA1C in the last 72 hours. Lipid Profile No results for input(s): CHOL, HDL, LDLCALC, TRIG, CHOLHDL, LDLDIRECT in the last 72 hours. Thyroid function studies No results for input(s): TSH, T4TOTAL, T3FREE, THYROIDAB in the last 72 hours.  Invalid input(s): FREET3 Anemia work up No results for input(s): VITAMINB12, FOLATE, FERRITIN, TIBC, IRON, RETICCTPCT in the last 72 hours. Urinalysis    Component Value Date/Time   COLORURINE STRAW (A) 07/27/2020 1640   APPEARANCEUR CLEAR 07/27/2020 1640   LABSPEC 1.008 07/27/2020 1640   PHURINE 5.0 07/27/2020 1640   GLUCOSEU NEGATIVE 07/27/2020 1640   HGBUR NEGATIVE 07/27/2020 1640   BILIRUBINUR NEGATIVE 07/27/2020 1640   KETONESUR NEGATIVE 07/27/2020 1640   PROTEINUR NEGATIVE 07/27/2020 1640   UROBILINOGEN 0.2 06/28/2014 1500   NITRITE NEGATIVE 07/27/2020 1640   LEUKOCYTESUR NEGATIVE 07/27/2020 1640   Sepsis Labs Invalid input(s): PROCALCITONIN,  WBC,  LACTICIDVEN Microbiology Recent Results (from the past 240 hour(s))  Resp Panel by RT-PCR (Flu A&B, Covid) Nasopharyngeal Swab     Status: None   Collection Time: 08/25/20 10:23 PM   Specimen: Nasopharyngeal Swab; Nasopharyngeal(NP) swabs in vial transport medium  Result Value Ref Range Status   SARS Coronavirus 2 by RT PCR NEGATIVE NEGATIVE Final    Comment: (NOTE) SARS-CoV-2 target nucleic acids are NOT DETECTED.  The SARS-CoV-2 RNA is generally detectable in upper respiratory specimens during the acute phase of infection. The lowest concentration of SARS-CoV-2 viral copies this assay can detect is 138  copies/mL. A negative result does not preclude SARS-Cov-2 infection and should not be used as the sole basis for treatment or other patient management decisions. A negative result may occur with  improper specimen collection/handling, submission of specimen other than nasopharyngeal swab, presence of viral mutation(s) within the areas targeted by this assay, and inadequate number of viral copies(<138 copies/mL). A negative result must be combined with clinical observations, patient history, and epidemiological information. The expected result is Negative.  Fact Sheet for Patients:  EntrepreneurPulse.com.au  Fact Sheet for Healthcare Providers:  IncredibleEmployment.be  This test is no t yet approved or cleared by the [place] FDA and  has been authorized for detection and/or diagnosis of SARS-CoV-2 by FDA under an Emergency Use Authorization (EUA). This EUA will remain  in effect (meaning this test can be used) for the duration of the COVID-19 declaration under Section 564(b)(1) of the Act, 21 U.S.C.section 360bbb-3(b)(1), unless the authorization is terminated  or revoked sooner.       Influenza A by PCR NEGATIVE NEGATIVE Final   Influenza B by PCR NEGATIVE NEGATIVE Final    Comment: (NOTE) The Xpert Xpress SARS-CoV-2/FLU/RSV plus assay is intended as an aid in the diagnosis of influenza from Nasopharyngeal swab specimens and should not be used as a sole basis for treatment. Nasal washings and aspirates are unacceptable for Xpert Xpress SARS-CoV-2/FLU/RSV testing.  Fact Sheet for Patients: EntrepreneurPulse.com.au  Fact Sheet for Healthcare Providers: IncredibleEmployment.be  This test is not yet approved or cleared by the [place] FDA  and has been authorized for detection and/or diagnosis of SARS-CoV-2 by FDA under an Emergency Use Authorization (EUA). This EUA will remain in effect (meaning  this test can be used) for the duration of the COVID-19 declaration under Section 564(b)(1) of the Act, 21 U.S.C. section 360bbb-3(b)(1), unless the authorization is terminated or revoked.  Performed at [facility], [place] [address], [place], [place] [address]      Time coordinating discharge: Over 30 minutes  SIGNED:   Little Ishikawa, DO [facility] 08/28/2020, 3:27 PM Pager   If 7PM-7AM, please contact night-coverage [email or web address]

## 2020-08-29 LAB — CBC
HCT: 42.3 % (ref 36.0–46.0)
Hemoglobin: 13.3 g/dL (ref 12.0–15.0)
MCH: 27.9 pg (ref 26.0–34.0)
MCHC: 31.4 g/dL (ref 30.0–36.0)
MCV: 88.9 fL (ref 80.0–100.0)
Platelets: 306 10*3/uL (ref 150–400)
RBC: 4.76 MIL/uL (ref 3.87–5.11)
RDW: 14.1 % (ref 11.5–15.5)
WBC: 9.2 10*3/uL (ref 4.0–10.5)
nRBC: 0.2 % (ref 0.0–0.2)

## 2020-08-29 LAB — BASIC METABOLIC PANEL
Anion gap: 13 (ref 5–15)
BUN: 139 mg/dL — ABNORMAL HIGH (ref 8–23)
CO2: 31 mmol/L (ref 22–32)
Calcium: 11.5 mg/dL — ABNORMAL HIGH (ref 8.9–10.3)
Chloride: 97 mmol/L — ABNORMAL LOW (ref 98–111)
Creatinine, Ser: 2.62 mg/dL — ABNORMAL HIGH (ref 0.44–1.00)
GFR, Estimated: 19 mL/min — ABNORMAL LOW (ref >60)
Glucose, Bld: 117 mg/dL — ABNORMAL HIGH (ref 70–99)
Potassium: 3.6 mmol/L (ref 3.5–5.1)
Sodium: 141 mmol/L (ref 135–145)

## 2020-08-29 NOTE — Progress Notes (Signed)
Pt stable for discharge

## 2020-08-29 NOTE — Progress Notes (Signed)
Pt left at this time via stretcher accompanied by [facility]AR attendants.

## 2020-08-29 NOTE — Progress Notes (Signed)
Pt safe for discharge

## 2020-08-31 ENCOUNTER — Telehealth: Payer: Self-pay

## 2020-08-31 NOTE — Telephone Encounter (Signed)
PC consult scheduled with Dr. Hollace Kinnier. Documentation will be noted in [facility].

## 2020-08-31 NOTE — Telephone Encounter (Signed)
Spoke with patient's son Lanny Hurst and scheduled an in-person Palliative Consult for 09/15/20 @ 8:30AM   COVID screening was negative. No pets in home. Patient lives with brother.  Consent obtained; updated Outlook/Netsmart/Team List and Epic.   Family is aware they may be receiving a call from NP the day before or day of to confirm appointment.

## 2020-09-07 ENCOUNTER — Inpatient Hospital Stay (HOSPITAL_COMMUNITY)
Admission: EM | Admit: 2020-09-07 | Discharge: 2020-09-25 | DRG: 640 | Disposition: A | Discharge status: Home Health | Payer: No Typology Code available for payment source | Attending: Family Medicine | Admitting: Family Medicine

## 2020-09-07 ENCOUNTER — Encounter (HOSPITAL_COMMUNITY): Payer: Self-pay | Admitting: Emergency Medicine

## 2020-09-07 ENCOUNTER — Emergency Department (HOSPITAL_COMMUNITY): Payer: No Typology Code available for payment source

## 2020-09-07 ENCOUNTER — Other Ambulatory Visit: Payer: Self-pay

## 2020-09-07 DIAGNOSIS — R532 Functional quadriplegia: Secondary | ICD-10-CM | POA: Diagnosis present

## 2020-09-07 DIAGNOSIS — E11622 Type 2 diabetes mellitus with other skin ulcer: Secondary | ICD-10-CM

## 2020-09-07 DIAGNOSIS — Z9989 Dependence on other enabling machines and devices: Secondary | ICD-10-CM

## 2020-09-07 DIAGNOSIS — E876 Hypokalemia: Principal | ICD-10-CM | POA: Diagnosis present

## 2020-09-07 DIAGNOSIS — Z888 Allergy status to other drugs, medicaments and biological substances status: Secondary | ICD-10-CM

## 2020-09-07 DIAGNOSIS — E1169 Type 2 diabetes mellitus with other specified complication: Secondary | ICD-10-CM | POA: Diagnosis present

## 2020-09-07 DIAGNOSIS — G4733 Obstructive sleep apnea (adult) (pediatric): Secondary | ICD-10-CM

## 2020-09-07 DIAGNOSIS — E860 Dehydration: Principal | ICD-10-CM | POA: Diagnosis present

## 2020-09-07 DIAGNOSIS — I5033 Acute on chronic diastolic (congestive) heart failure: Secondary | ICD-10-CM | POA: Diagnosis present

## 2020-09-07 DIAGNOSIS — Z885 Allergy status to narcotic agent status: Secondary | ICD-10-CM

## 2020-09-07 DIAGNOSIS — N1831 Chronic kidney disease, stage 3a: Secondary | ICD-10-CM | POA: Diagnosis present

## 2020-09-07 DIAGNOSIS — I5032 Chronic diastolic (congestive) heart failure: Secondary | ICD-10-CM | POA: Diagnosis present

## 2020-09-07 DIAGNOSIS — E1140 Type 2 diabetes mellitus with diabetic neuropathy, unspecified: Secondary | ICD-10-CM | POA: Diagnosis present

## 2020-09-07 DIAGNOSIS — Z833 Family history of diabetes mellitus: Secondary | ICD-10-CM

## 2020-09-07 DIAGNOSIS — Z7401 Bed confinement status: Secondary | ICD-10-CM

## 2020-09-07 DIAGNOSIS — N179 Acute kidney failure, unspecified: Secondary | ICD-10-CM | POA: Diagnosis present

## 2020-09-07 DIAGNOSIS — L98499 Non-pressure chronic ulcer of skin of other sites with unspecified severity: Secondary | ICD-10-CM

## 2020-09-07 DIAGNOSIS — E785 Hyperlipidemia, unspecified: Secondary | ICD-10-CM | POA: Diagnosis present

## 2020-09-07 DIAGNOSIS — E1165 Type 2 diabetes mellitus with hyperglycemia: Secondary | ICD-10-CM | POA: Diagnosis present

## 2020-09-07 DIAGNOSIS — I130 Hypertensive heart and chronic kidney disease with heart failure and stage 1 through stage 4 chronic kidney disease, or unspecified chronic kidney disease: Secondary | ICD-10-CM | POA: Diagnosis present

## 2020-09-07 DIAGNOSIS — Z6841 Body Mass Index (BMI) 40.0 and over, adult: Secondary | ICD-10-CM

## 2020-09-07 DIAGNOSIS — K859 Acute pancreatitis without necrosis or infection, unspecified: Secondary | ICD-10-CM | POA: Diagnosis present

## 2020-09-07 DIAGNOSIS — Z7984 Long term (current) use of oral hypoglycemic drugs: Secondary | ICD-10-CM

## 2020-09-07 DIAGNOSIS — Z90710 Acquired absence of both cervix and uterus: Secondary | ICD-10-CM

## 2020-09-07 DIAGNOSIS — Z87891 Personal history of nicotine dependence: Secondary | ICD-10-CM

## 2020-09-07 DIAGNOSIS — Z20822 Contact with and (suspected) exposure to covid-19: Secondary | ICD-10-CM | POA: Diagnosis present

## 2020-09-07 DIAGNOSIS — L299 Pruritus, unspecified: Secondary | ICD-10-CM | POA: Diagnosis not present

## 2020-09-07 DIAGNOSIS — E119 Type 2 diabetes mellitus without complications: Secondary | ICD-10-CM | POA: Diagnosis present

## 2020-09-07 DIAGNOSIS — L03119 Cellulitis of unspecified part of limb: Secondary | ICD-10-CM | POA: Diagnosis not present

## 2020-09-07 DIAGNOSIS — H5789 Other specified disorders of eye and adnexa: Secondary | ICD-10-CM | POA: Diagnosis not present

## 2020-09-07 DIAGNOSIS — Z9119 Patient's noncompliance with other medical treatment and regimen: Secondary | ICD-10-CM

## 2020-09-07 DIAGNOSIS — E861 Hypovolemia: Secondary | ICD-10-CM | POA: Diagnosis present

## 2020-09-07 DIAGNOSIS — M25552 Pain in left hip: Secondary | ICD-10-CM | POA: Diagnosis not present

## 2020-09-07 DIAGNOSIS — R112 Nausea with vomiting, unspecified: Secondary | ICD-10-CM | POA: Diagnosis present

## 2020-09-07 DIAGNOSIS — N1832 Chronic kidney disease, stage 3b: Secondary | ICD-10-CM | POA: Diagnosis present

## 2020-09-07 DIAGNOSIS — K5900 Constipation, unspecified: Secondary | ICD-10-CM | POA: Diagnosis present

## 2020-09-07 DIAGNOSIS — L8990 Pressure ulcer of unspecified site, unspecified stage: Secondary | ICD-10-CM | POA: Diagnosis present

## 2020-09-07 DIAGNOSIS — N183 Chronic kidney disease, stage 3 unspecified: Secondary | ICD-10-CM | POA: Diagnosis present

## 2020-09-07 DIAGNOSIS — E1122 Type 2 diabetes mellitus with diabetic chronic kidney disease: Secondary | ICD-10-CM | POA: Diagnosis present

## 2020-09-07 DIAGNOSIS — E878 Other disorders of electrolyte and fluid balance, not elsewhere classified: Secondary | ICD-10-CM | POA: Diagnosis present

## 2020-09-07 DIAGNOSIS — Z79899 Other long term (current) drug therapy: Secondary | ICD-10-CM

## 2020-09-07 DIAGNOSIS — Z880 Allergy status to penicillin: Secondary | ICD-10-CM

## 2020-09-07 DIAGNOSIS — R748 Abnormal levels of other serum enzymes: Secondary | ICD-10-CM | POA: Diagnosis present

## 2020-09-07 LAB — CBC WITH DIFFERENTIAL/PLATELET
Abs Immature Granulocytes: 0.13 10*3/uL — ABNORMAL HIGH (ref 0.00–0.07)
Basophils Absolute: 0 10*3/uL (ref 0.0–0.1)
Basophils Relative: 0 %
Eosinophils Absolute: 0.2 10*3/uL (ref 0.0–0.5)
Eosinophils Relative: 2 %
HCT: 41.7 % (ref 36.0–46.0)
Hemoglobin: 13.1 g/dL (ref 12.0–15.0)
Immature Granulocytes: 1 %
Lymphocytes Relative: 13 %
Lymphs Abs: 1.2 10*3/uL (ref 0.7–4.0)
MCH: 27.6 pg (ref 26.0–34.0)
MCHC: 31.4 g/dL (ref 30.0–36.0)
MCV: 87.8 fL (ref 80.0–100.0)
Monocytes Absolute: 1.1 10*3/uL — ABNORMAL HIGH (ref 0.1–1.0)
Monocytes Relative: 11 %
Neutro Abs: 6.9 10*3/uL (ref 1.7–7.7)
Neutrophils Relative %: 73 %
Platelets: 349 10*3/uL (ref 150–400)
RBC: 4.75 MIL/uL (ref 3.87–5.11)
RDW: 14.7 % (ref 11.5–15.5)
WBC: 9.5 10*3/uL (ref 4.0–10.5)
nRBC: 0.2 % (ref 0.0–0.2)

## 2020-09-07 LAB — COMPREHENSIVE METABOLIC PANEL
ALT: 20 U/L (ref 0–44)
AST: 24 U/L (ref 15–41)
Albumin: 4.2 g/dL (ref 3.5–5.0)
Alkaline Phosphatase: 76 U/L (ref 38–126)
Anion gap: 20 — ABNORMAL HIGH (ref 5–15)
BUN: 133 mg/dL — ABNORMAL HIGH (ref 8–23)
CO2: 31 mmol/L (ref 22–32)
Calcium: 11.1 mg/dL — ABNORMAL HIGH (ref 8.9–10.3)
Chloride: 86 mmol/L — ABNORMAL LOW (ref 98–111)
Creatinine, Ser: 3.17 mg/dL — ABNORMAL HIGH (ref 0.44–1.00)
GFR, Estimated: 15 mL/min — ABNORMAL LOW (ref >60)
Glucose, Bld: 137 mg/dL — ABNORMAL HIGH (ref 70–99)
Potassium: 3 mmol/L — ABNORMAL LOW (ref 3.5–5.1)
Sodium: 137 mmol/L (ref 135–145)
Total Bilirubin: 1.6 mg/dL — ABNORMAL HIGH (ref 0.3–1.2)
Total Protein: 8.2 g/dL — ABNORMAL HIGH (ref 6.5–8.1)

## 2020-09-07 LAB — LIPASE, BLOOD: Lipase: 94 U/L — ABNORMAL HIGH (ref 11–51)

## 2020-09-07 LAB — CBG MONITORING, ED: Glucose-Capillary: 133 mg/dL — ABNORMAL HIGH (ref 70–99)

## 2020-09-07 MED ORDER — ONDANSETRON HCL 4 MG PO TABS: 4.0000 mg | ORAL_TABLET | Freq: Four times a day (QID) | ORAL | Status: DC | PRN

## 2020-09-07 MED ORDER — BISACODYL 10 MG RE SUPP: 10.0000 mg | Freq: Once | RECTAL | Status: DC

## 2020-09-07 MED ORDER — POTASSIUM CHLORIDE CRYS ER 20 MEQ PO TBCR: 20.0000 meq | EXTENDED_RELEASE_TABLET | Freq: Two times a day (BID) | ORAL | 0 refills | Status: DC

## 2020-09-07 MED ORDER — ACETAMINOPHEN 650 MG RE SUPP: 650.0000 mg | Freq: Four times a day (QID) | RECTAL | Status: DC | PRN

## 2020-09-07 MED ORDER — HEPARIN SODIUM (PORCINE) 5000 UNIT/ML IJ SOLN
5000.0000 [IU] | Freq: Three times a day (TID) | INTRAMUSCULAR | Status: DC
  Administered 2020-09-07 – 2020-09-13 (×15): 5000 [IU] via SUBCUTANEOUS
  Filled 2020-09-07 (×15): qty 1

## 2020-09-07 MED ORDER — ATORVASTATIN CALCIUM 10 MG PO TABS
10.0000 mg | ORAL_TABLET | Freq: Every day | ORAL | Status: DC
  Administered 2020-09-07 – 2020-09-25 (×13): 10 mg via ORAL
  Filled 2020-09-07 (×19): qty 1

## 2020-09-07 MED ORDER — ALBUTEROL SULFATE HFA 108 (90 BASE) MCG/ACT IN AERS: 2.0000 | INHALATION_SPRAY | Freq: Four times a day (QID) | RESPIRATORY_TRACT | Status: DC | PRN

## 2020-09-07 MED ORDER — ALBUTEROL SULFATE (2.5 MG/3ML) 0.083% IN NEBU: 2.5000 mg | INHALATION_SOLUTION | Freq: Four times a day (QID) | RESPIRATORY_TRACT | Status: DC | PRN

## 2020-09-07 MED ORDER — POTASSIUM CHLORIDE 20 MEQ PO PACK
40.0000 meq | PACK | Freq: Once | ORAL | Status: AC
  Administered 2020-09-07: 40 meq via ORAL
  Filled 2020-09-07: qty 2

## 2020-09-07 MED ORDER — METOCLOPRAMIDE HCL 5 MG/ML IJ SOLN
5.0000 mg | Freq: Four times a day (QID) | INTRAMUSCULAR | Status: DC | PRN
  Administered 2020-09-07 – 2020-09-08 (×2): 5 mg via INTRAVENOUS
  Filled 2020-09-07 (×2): qty 2

## 2020-09-07 MED ORDER — MIDODRINE HCL 5 MG PO TABS
15.0000 mg | ORAL_TABLET | Freq: Three times a day (TID) | ORAL | Status: DC
  Administered 2020-09-08 – 2020-09-25 (×15): 15 mg via ORAL
  Filled 2020-09-07 (×27): qty 3

## 2020-09-07 MED ORDER — INSULIN ASPART 100 UNIT/ML IJ SOLN
0.0000 [IU] | INTRAMUSCULAR | Status: DC
  Administered 2020-09-08 (×2): 1 [IU] via SUBCUTANEOUS
  Administered 2020-09-08: 2 [IU] via SUBCUTANEOUS
  Administered 2020-09-09 – 2020-09-11 (×6): 1 [IU] via SUBCUTANEOUS
  Administered 2020-09-11: 2 [IU] via SUBCUTANEOUS
  Administered 2020-09-11 – 2020-09-13 (×4): 1 [IU] via SUBCUTANEOUS

## 2020-09-07 MED ORDER — ACETAMINOPHEN 325 MG PO TABS
650.0000 mg | ORAL_TABLET | Freq: Four times a day (QID) | ORAL | Status: DC | PRN
  Administered 2020-09-17 – 2020-09-23 (×4): 650 mg via ORAL
  Filled 2020-09-07 (×5): qty 2

## 2020-09-07 MED ORDER — PANTOPRAZOLE SODIUM 40 MG PO TBEC
40.0000 mg | DELAYED_RELEASE_TABLET | Freq: Every day | ORAL | Status: DC
  Administered 2020-09-08: 40 mg via ORAL
  Filled 2020-09-07 (×2): qty 1

## 2020-09-07 MED ORDER — MIRTAZAPINE 15 MG PO TABS
7.5000 mg | ORAL_TABLET | Freq: Every day | ORAL | Status: DC
  Administered 2020-09-11 – 2020-09-24 (×6): 7.5 mg via ORAL
  Filled 2020-09-07 (×15): qty 1

## 2020-09-07 MED ORDER — SODIUM CHLORIDE 0.9 % IV SOLN
75.0000 mL/h | INTRAVENOUS | Status: AC
  Administered 2020-09-08: 75 mL/h via INTRAVENOUS

## 2020-09-07 MED ORDER — POLYETHYLENE GLYCOL 3350 17 G PO PACK
17.0000 g | PACK | Freq: Every day | ORAL | Status: DC
  Filled 2020-09-07: qty 1

## 2020-09-07 MED ORDER — ONDANSETRON HCL 4 MG/2ML IJ SOLN
4.0000 mg | Freq: Four times a day (QID) | INTRAMUSCULAR | Status: DC | PRN
  Administered 2020-09-08 – 2020-09-09 (×3): 4 mg via INTRAVENOUS
  Filled 2020-09-07 (×3): qty 2

## 2020-09-07 NOTE — ED Triage Notes (Signed)
Pt arrives via EMS for "abnormal labs" per her home health RN. HH RN called EMS- did not share what the results were. Pt has no complaints.

## 2020-09-07 NOTE — ED Provider Notes (Addendum)
Clinical Course as of 09/07/20 1938  Mon Sep 07, 2020  1937 Creatinine(!): 3.17 [DR]  1937 Labs reviewed and appear unchanged, although abnormal, from prior- creatinine 3, calcium 11, total bili at 1.6. Potassium is low at 3 and will be repleted.  [DR]    Clinical Course User Index [DR] Pattricia Boss, MD  Care received and patient awaiting chemistry. She was sent here for low potassium.  Her potassium is 3.0.  She is given oral repletion in the department and prescription. Other labs reviewed and stable from prior except lipase which has been elevated but is higher today and consistent with patient's symptoms. She reports being unable to tolerate p.o. except for ice chips and has had ongoing vomiting at home.  Patient appears chronically ill, will discuss with hospitalist team as recent d/c Review of last admission reveals similar concerns although elevated lipase does not appear to  have been specifically addressed in setting of epigastric discomfort, nausea and difficulty tolerating fluids. Patient with creatinine increased from d/c at 2.62 to 3.17, hypochloremia, hypokalemia, and hypocalcemia alson noted. Discussed with Dr. Roel Cluck who will see for admission     Pattricia Boss, MD 09/07/20 Leeanne Mannan    Pattricia Boss, MD 09/07/20 2027
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
## 2020-09-07 NOTE — Progress Notes (Signed)
RT note. Patient refuse CPAP tonight. Patient stated she does wear a cpap at home but does not want to wear one tonight. Told patient to let her RN know if she changes her mind and RT will bring a CPAP for the night.  Patient currently on Rm air sat 98% w/ stable VS RT will continue to monitor.
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
## 2020-09-07 NOTE — Discharge Instructions (Addendum)
Potassium was low at 3 today.  You have been given a prescription for 5 days worth of potassium.  Please have your potassium rechecked during this time and eat potassium rich foods.

## 2020-09-07 NOTE — ED Notes (Signed)
Pt informs this RN she has a wound on her abd and that is painful- 10/10

## 2020-09-08 ENCOUNTER — Observation Stay (HOSPITAL_COMMUNITY): Payer: No Typology Code available for payment source

## 2020-09-08 DIAGNOSIS — I130 Hypertensive heart and chronic kidney disease with heart failure and stage 1 through stage 4 chronic kidney disease, or unspecified chronic kidney disease: Secondary | ICD-10-CM | POA: Diagnosis present

## 2020-09-08 DIAGNOSIS — N179 Acute kidney failure, unspecified: Secondary | ICD-10-CM | POA: Diagnosis present

## 2020-09-08 DIAGNOSIS — Z20822 Contact with and (suspected) exposure to covid-19: Secondary | ICD-10-CM | POA: Diagnosis present

## 2020-09-08 DIAGNOSIS — Z87891 Personal history of nicotine dependence: Secondary | ICD-10-CM | POA: Diagnosis not present

## 2020-09-08 DIAGNOSIS — E785 Hyperlipidemia, unspecified: Secondary | ICD-10-CM | POA: Diagnosis present

## 2020-09-08 DIAGNOSIS — E861 Hypovolemia: Secondary | ICD-10-CM | POA: Diagnosis present

## 2020-09-08 DIAGNOSIS — Z9989 Dependence on other enabling machines and devices: Secondary | ICD-10-CM

## 2020-09-08 DIAGNOSIS — E1165 Type 2 diabetes mellitus with hyperglycemia: Secondary | ICD-10-CM | POA: Diagnosis present

## 2020-09-08 DIAGNOSIS — E878 Other disorders of electrolyte and fluid balance, not elsewhere classified: Secondary | ICD-10-CM | POA: Diagnosis present

## 2020-09-08 DIAGNOSIS — E1140 Type 2 diabetes mellitus with diabetic neuropathy, unspecified: Secondary | ICD-10-CM | POA: Diagnosis present

## 2020-09-08 DIAGNOSIS — R532 Functional quadriplegia: Secondary | ICD-10-CM | POA: Diagnosis present

## 2020-09-08 DIAGNOSIS — E860 Dehydration: Secondary | ICD-10-CM | POA: Diagnosis present

## 2020-09-08 DIAGNOSIS — N1832 Chronic kidney disease, stage 3b: Secondary | ICD-10-CM | POA: Diagnosis present

## 2020-09-08 DIAGNOSIS — G4733 Obstructive sleep apnea (adult) (pediatric): Secondary | ICD-10-CM | POA: Diagnosis present

## 2020-09-08 DIAGNOSIS — I5032 Chronic diastolic (congestive) heart failure: Secondary | ICD-10-CM

## 2020-09-08 DIAGNOSIS — R6889 Other general symptoms and signs: Secondary | ICD-10-CM | POA: Diagnosis not present

## 2020-09-08 DIAGNOSIS — R748 Abnormal levels of other serum enzymes: Secondary | ICD-10-CM | POA: Diagnosis present

## 2020-09-08 DIAGNOSIS — Z833 Family history of diabetes mellitus: Secondary | ICD-10-CM | POA: Diagnosis not present

## 2020-09-08 DIAGNOSIS — L299 Pruritus, unspecified: Secondary | ICD-10-CM | POA: Diagnosis not present

## 2020-09-08 DIAGNOSIS — N1831 Chronic kidney disease, stage 3a: Secondary | ICD-10-CM

## 2020-09-08 DIAGNOSIS — E876 Hypokalemia: Secondary | ICD-10-CM | POA: Diagnosis present

## 2020-09-08 DIAGNOSIS — Z743 Need for continuous supervision: Secondary | ICD-10-CM | POA: Diagnosis not present

## 2020-09-08 DIAGNOSIS — Z7401 Bed confinement status: Secondary | ICD-10-CM | POA: Diagnosis not present

## 2020-09-08 DIAGNOSIS — E1122 Type 2 diabetes mellitus with diabetic chronic kidney disease: Secondary | ICD-10-CM | POA: Diagnosis present

## 2020-09-08 DIAGNOSIS — R5381 Other malaise: Secondary | ICD-10-CM | POA: Diagnosis not present

## 2020-09-08 DIAGNOSIS — Z6841 Body Mass Index (BMI) 40.0 and over, adult: Secondary | ICD-10-CM | POA: Diagnosis not present

## 2020-09-08 DIAGNOSIS — K5900 Constipation, unspecified: Secondary | ICD-10-CM | POA: Diagnosis present

## 2020-09-08 LAB — COMPREHENSIVE METABOLIC PANEL
ALT: 22 U/L (ref 0–44)
AST: 39 U/L (ref 15–41)
Albumin: 4 g/dL (ref 3.5–5.0)
Alkaline Phosphatase: 72 U/L (ref 38–126)
Anion gap: 18 — ABNORMAL HIGH (ref 5–15)
BUN: 144 mg/dL — ABNORMAL HIGH (ref 8–23)
CO2: 30 mmol/L (ref 22–32)
Calcium: 10.7 mg/dL — ABNORMAL HIGH (ref 8.9–10.3)
Chloride: 88 mmol/L — ABNORMAL LOW (ref 98–111)
Creatinine, Ser: 3.1 mg/dL — ABNORMAL HIGH (ref 0.44–1.00)
GFR, Estimated: 16 mL/min — ABNORMAL LOW (ref >60)
Glucose, Bld: 125 mg/dL — ABNORMAL HIGH (ref 70–99)
Potassium: 5.4 mmol/L — ABNORMAL HIGH (ref 3.5–5.1)
Sodium: 136 mmol/L (ref 135–145)
Total Bilirubin: 1.8 mg/dL — ABNORMAL HIGH (ref 0.3–1.2)
Total Protein: 8 g/dL (ref 6.5–8.1)

## 2020-09-08 LAB — BASIC METABOLIC PANEL
Anion gap: 26 — ABNORMAL HIGH (ref 5–15)
Anion gap: 19 — ABNORMAL HIGH (ref 5–15)
BUN: 136 mg/dL — ABNORMAL HIGH (ref 8–23)
BUN: 140 mg/dL — ABNORMAL HIGH (ref 8–23)
CO2: 10 mmol/L — ABNORMAL LOW (ref 22–32)
CO2: 29 mmol/L (ref 22–32)
Calcium: 9.6 mg/dL (ref 8.9–10.3)
Calcium: 10.7 mg/dL — ABNORMAL HIGH (ref 8.9–10.3)
Chloride: 68 mmol/L — ABNORMAL LOW (ref 98–111)
Chloride: 90 mmol/L — ABNORMAL LOW (ref 98–111)
Creatinine, Ser: 3.01 mg/dL — ABNORMAL HIGH (ref 0.44–1.00)
Creatinine, Ser: 3.12 mg/dL — ABNORMAL HIGH (ref 0.44–1.00)
GFR, Estimated: 16 mL/min — ABNORMAL LOW (ref >60)
GFR, Estimated: 16 mL/min — ABNORMAL LOW (ref >60)
Glucose, Bld: 113 mg/dL — ABNORMAL HIGH (ref 70–99)
Glucose, Bld: 137 mg/dL — ABNORMAL HIGH (ref 70–99)
Potassium: 2.7 mmol/L — CL (ref 3.5–5.1)
Potassium: 2.8 mmol/L — ABNORMAL LOW (ref 3.5–5.1)
Sodium: 104 mmol/L — CL (ref 135–145)
Sodium: 138 mmol/L (ref 135–145)

## 2020-09-08 LAB — URINALYSIS, ROUTINE W REFLEX MICROSCOPIC
Bilirubin Urine: NEGATIVE
Glucose, UA: NEGATIVE mg/dL
Ketones, ur: 5 mg/dL — AB
Nitrite: NEGATIVE
Protein, ur: NEGATIVE mg/dL
Specific Gravity, Urine: 1.013 (ref 1.005–1.030)
pH: 5 (ref 5.0–8.0)

## 2020-09-08 LAB — CBC WITH DIFFERENTIAL/PLATELET
Abs Immature Granulocytes: 0.18 10*3/uL — ABNORMAL HIGH (ref 0.00–0.07)
Basophils Absolute: 0 10*3/uL (ref 0.0–0.1)
Basophils Relative: 1 %
Eosinophils Absolute: 0.2 10*3/uL (ref 0.0–0.5)
Eosinophils Relative: 2 %
HCT: 42.6 % (ref 36.0–46.0)
Hemoglobin: 13.4 g/dL (ref 12.0–15.0)
Immature Granulocytes: 2 %
Lymphocytes Relative: 14 %
Lymphs Abs: 1.2 10*3/uL (ref 0.7–4.0)
MCH: 27.7 pg (ref 26.0–34.0)
MCHC: 31.5 g/dL (ref 30.0–36.0)
MCV: 88 fL (ref 80.0–100.0)
Monocytes Absolute: 0.9 10*3/uL (ref 0.1–1.0)
Monocytes Relative: 10 %
Neutro Abs: 6.4 10*3/uL (ref 1.7–7.7)
Neutrophils Relative %: 71 %
Platelets: 355 10*3/uL (ref 150–400)
RBC: 4.84 MIL/uL (ref 3.87–5.11)
RDW: 14.8 % (ref 11.5–15.5)
WBC: 8.8 10*3/uL (ref 4.0–10.5)
nRBC: 0.2 % (ref 0.0–0.2)

## 2020-09-08 LAB — I-STAT VENOUS BLOOD GAS, ED
Acid-Base Excess: 7 mmol/L — ABNORMAL HIGH (ref 0.0–2.0)
Bicarbonate: 31.8 mmol/L — ABNORMAL HIGH (ref 20.0–28.0)
Calcium, Ion: 1.15 mmol/L (ref 1.15–1.40)
HCT: 41 % (ref 36.0–46.0)
Hemoglobin: 13.9 g/dL (ref 12.0–15.0)
O2 Saturation: 90 %
Potassium: 2.8 mmol/L — ABNORMAL LOW (ref 3.5–5.1)
Sodium: 136 mmol/L (ref 135–145)
TCO2: 33 mmol/L — ABNORMAL HIGH (ref 22–32)
pCO2, Ven: 42.3 mmHg — ABNORMAL LOW (ref 44.0–60.0)
pH, Ven: 7.484 — ABNORMAL HIGH (ref 7.250–7.430)
pO2, Ven: 56 mmHg — ABNORMAL HIGH (ref 32.0–45.0)

## 2020-09-08 LAB — CBG MONITORING, ED: Glucose-Capillary: 134 mg/dL — ABNORMAL HIGH (ref 70–99)

## 2020-09-08 LAB — RENAL FUNCTION PANEL
Albumin: 3.9 g/dL (ref 3.5–5.0)
Anion gap: 22 — ABNORMAL HIGH (ref 5–15)
BUN: 139 mg/dL — ABNORMAL HIGH (ref 8–23)
CO2: 29 mmol/L (ref 22–32)
Calcium: 11 mg/dL — ABNORMAL HIGH (ref 8.9–10.3)
Chloride: 86 mmol/L — ABNORMAL LOW (ref 98–111)
Creatinine, Ser: 3.13 mg/dL — ABNORMAL HIGH (ref 0.44–1.00)
GFR, Estimated: 16 mL/min — ABNORMAL LOW (ref >60)
Glucose, Bld: 126 mg/dL — ABNORMAL HIGH (ref 70–99)
Phosphorus: 3.3 mg/dL (ref 2.5–4.6)
Potassium: 3.1 mmol/L — ABNORMAL LOW (ref 3.5–5.1)
Sodium: 137 mmol/L (ref 135–145)

## 2020-09-08 LAB — GLUCOSE, CAPILLARY
Glucose-Capillary: 147 mg/dL — ABNORMAL HIGH (ref 70–99)
Glucose-Capillary: 169 mg/dL — ABNORMAL HIGH (ref 70–99)
Glucose-Capillary: 118 mg/dL — ABNORMAL HIGH (ref 70–99)
Glucose-Capillary: 112 mg/dL — ABNORMAL HIGH (ref 70–99)

## 2020-09-08 LAB — PHOSPHORUS: Phosphorus: 3.2 mg/dL (ref 2.5–4.6)

## 2020-09-08 LAB — RESP PANEL BY RT-PCR (FLU A&B, COVID) ARPGX2
Influenza A by PCR: NEGATIVE
Influenza B by PCR: NEGATIVE
SARS Coronavirus 2 by RT PCR: NEGATIVE

## 2020-09-08 LAB — MAGNESIUM: Magnesium: 2.5 mg/dL — ABNORMAL HIGH (ref 1.7–2.4)

## 2020-09-08 LAB — TROPONIN I (HIGH SENSITIVITY)
Troponin I (High Sensitivity): 54 ng/L — ABNORMAL HIGH (ref <18)
Troponin I (High Sensitivity): 60 ng/L — ABNORMAL HIGH (ref <18)

## 2020-09-08 LAB — HEMOGLOBIN A1C
Hgb A1c MFr Bld: 6 % — ABNORMAL HIGH (ref 4.8–5.6)
Mean Plasma Glucose: 125.5 mg/dL

## 2020-09-08 LAB — LIPASE, BLOOD: Lipase: 98 U/L — ABNORMAL HIGH (ref 11–51)

## 2020-09-08 LAB — BETA-HYDROXYBUTYRIC ACID: Beta-Hydroxybutyric Acid: 3.93 mmol/L — ABNORMAL HIGH (ref 0.05–0.27)

## 2020-09-08 LAB — CREATININE, URINE, RANDOM: Creatinine, Urine: 140.29 mg/dL

## 2020-09-08 LAB — LACTIC ACID, PLASMA: Lactic Acid, Venous: 1.5 mmol/L (ref 0.5–1.9)

## 2020-09-08 LAB — SODIUM, URINE, RANDOM: Sodium, Ur: <10 mmol/L

## 2020-09-08 LAB — CK: Total CK: 76 U/L (ref 38–234)

## 2020-09-08 LAB — TSH: TSH: 2.836 u[IU]/mL (ref 0.350–4.500)

## 2020-09-08 MED ORDER — SODIUM CHLORIDE 0.9 % IV SOLN: INTRAVENOUS | Status: AC

## 2020-09-08 MED ORDER — PROSOURCE PLUS PO LIQD
30.0000 mL | Freq: Three times a day (TID) | ORAL | Status: DC
  Administered 2020-09-10 – 2020-09-19 (×11): 30 mL via ORAL
  Filled 2020-09-08 (×28): qty 30

## 2020-09-08 MED ORDER — BISACODYL 5 MG PO TBEC
10.0000 mg | DELAYED_RELEASE_TABLET | Freq: Once | ORAL | Status: AC
  Administered 2020-09-08: 10 mg via ORAL
  Filled 2020-09-08: qty 2

## 2020-09-08 MED ORDER — POLYETHYLENE GLYCOL 3350 17 G PO PACK
34.0000 g | PACK | Freq: Every day | ORAL | Status: DC
  Administered 2020-09-08: 34 g via ORAL

## 2020-09-08 MED ORDER — POLYETHYLENE GLYCOL 3350 17 G PO PACK
34.0000 g | PACK | Freq: Two times a day (BID) | ORAL | Status: AC
  Filled 2020-09-08 (×2): qty 2

## 2020-09-08 MED ORDER — SENNOSIDES-DOCUSATE SODIUM 8.6-50 MG PO TABS
1.0000 | ORAL_TABLET | Freq: Two times a day (BID) | ORAL | Status: DC
  Administered 2020-09-08 – 2020-09-17 (×5): 1 via ORAL
  Filled 2020-09-08 (×17): qty 1

## 2020-09-08 MED ORDER — BOOST / RESOURCE BREEZE PO LIQD CUSTOM
1.0000 | Freq: Three times a day (TID) | ORAL | Status: DC
  Administered 2020-09-09: 1 via ORAL

## 2020-09-08 MED ORDER — POTASSIUM CHLORIDE CRYS ER 20 MEQ PO TBCR
40.0000 meq | EXTENDED_RELEASE_TABLET | Freq: Once | ORAL | Status: DC
  Filled 2020-09-08: qty 2

## 2020-09-08 MED ORDER — POTASSIUM CHLORIDE 10 MEQ/100ML IV SOLN
10.0000 meq | INTRAVENOUS | Status: AC
  Administered 2020-09-08 (×3): 10 meq via INTRAVENOUS
  Filled 2020-09-08 (×3): qty 100

## 2020-09-08 MED ORDER — ADULT MULTIVITAMIN W/MINERALS CH
1.0000 | ORAL_TABLET | Freq: Every day | ORAL | Status: DC
  Administered 2020-09-17 – 2020-09-20 (×2): 1 via ORAL
  Filled 2020-09-08 (×15): qty 1

## 2020-09-08 MED ORDER — SUCRALFATE 1 GM/10ML PO SUSP
1.0000 g | Freq: Three times a day (TID) | ORAL | Status: DC
  Administered 2020-09-08 – 2020-09-20 (×4): 1 g via ORAL
  Filled 2020-09-08 (×73): qty 10

## 2020-09-08 NOTE — Progress Notes (Signed)
OT Cancellation Note  Patient Details Name: [name] [name] MRN: [id number] DOB: 1955/09/02   Cancelled Treatment:    Reason Eval/Treat Not Completed: Medical issues which prohibited therapy (Pt declining therapy due to nausea and stomach pain. Per previous admission chart review, pt is bedbound at baseline, dependent ADLs at bed level with hospital bed and hoyer lift as well as near 24/7 care.)  [name] [name] 09/08/2020, 12:26 PM

## 2020-09-08 NOTE — Progress Notes (Signed)
PT Screen Note  Patient Details Name: [name] [name] MRN: [id number] DOB: 11-14-1955   Screen Treatment:    Reason Eval/Treat Not Completed: PT screened, no needs identified, will sign off  PT orders received and chart reviewed.  Reviewed prior PT notes -up to a year ago.  Pt is bed bound/hoyer lift with assist of 2 or 3 for at least a year.  She is total care for ADLs and all mobility. She has been to SNF in past and remained hoyer dependent.  Family member was present this afternoon and confirmed baseline, reports trying to get to [facility] for placement due to no longer able to manage at home.  Due to pt at baseline, will sign off at this time.  Recommend long term/custodial care placement.   Abran Richard, PT Acute Rehab Services Pager [phone] [facility] [place] 09/08/2020, 2:55 PM

## 2020-09-08 NOTE — Progress Notes (Signed)
Inpatient Diabetes Program Recommendations  AACE/ADA: New Consensus Statement on Inpatient Glycemic Control (2015)  Target Ranges:  Prepandial:   less than 140 mg/dL      Peak postprandial:   less than 180 mg/dL (1-2 hours)      Critically ill patients:  140 - 180 mg/dL   Lab Results  Component Value Date   GLUCAP 147 (H) 09/08/2020   HGBA1C 6.0 (H) 09/08/2020    Review of Glycemic Control Results for [name] [name], [name] [name] (MRN [id number]) as of 09/08/2020 12:40  Ref. Range 09/07/2020 21:12 09/08/2020 02:45 09/08/2020 10:14  Glucose-Capillary Latest Ref Range: 70 - 99 mg/dL 133 (H) 134 (H) 147 (H)  Results for [name] [name], [name] [name] (MRN [id number]) as of 09/08/2020 12:40  Ref. Range 09/08/2020 11:33  Sodium Latest Ref Range: 135 - 145 mmol/L 138  Potassium Latest Ref Range: 3.5 - 5.1 mmol/L 2.8 (L)  Chloride Latest Ref Range: 98 - 111 mmol/L 90 (L)  CO2 Latest Ref Range: 22 - 32 mmol/L 29  Glucose Latest Ref Range: 70 - 99 mg/dL 137 (H)  BUN Latest Ref Range: 8 - 23 mg/dL 140 (H)  Creatinine Latest Ref Range: 0.44 - 1.00 mg/dL 3.12 (H)  Calcium Latest Ref Range: 8.9 - 10.3 mg/dL 10.7 (H)  Anion gap Latest Ref Range: 5 - 15  19 (H)  Results for [name] [name], [name] [name] (MRN [id number]) as of 09/08/2020 12:40  Ref. Range 09/08/2020 03:02  Beta-Hydroxybutyric Acid Latest Ref Range: 0.05 - 0.27 mmol/L 3.93 (H)   Diabetes history: DM 2 Outpatient Diabetes medications:  Jardiance 12.5 mg daily, Actos 45 mg daily, Januvia 100 mg daily Current orders for Inpatient glycemic control:  Novolog sensitive q 4 hours  Inpatient Diabetes Program Recommendations:   Agree with current orders.  May consider recheck of Beta hydroxybutyric acid.    Thanks,  Adah Perl, RN, BC-ADM Inpatient Diabetes Coordinator Pager [phone] (8a-5p)

## 2020-09-08 NOTE — Progress Notes (Signed)
OT Cancellation Note  Patient Details Name: [name] [name] MRN: [id number] DOB: Jun 24, 1955   Cancelled Treatment:    Reason Eval/Treat Not Completed: OT screened, no needs identified, will sign off (Upon arrival, pt family member reported the need to find LTC facility for pt as they are unable to provide her current level of care. Per prior notes, pt has been dependent with use of hoyer, and +2 or 3 for ADLs for over a year. Family confirmed that pt is currently at her baseline. OT will sign off at this time.  Recommend long term/custodial care placement.)  Deklan Minar A Alonna Bartling 09/08/2020, 2:59 PM

## 2020-09-08 NOTE — Progress Notes (Signed)
PT Cancellation Note  Patient Details Name: [name] [name] MRN: [id number] DOB: 08/20/1955   Cancelled Treatment:    Reason Eval/Treat Not Completed: Other (comment)  PT reviewed chart and noted that pt is dependent and bed bound at home for several months.  She has a hospital bed and hoyer lift.  Pt reports goal of trying to sit in a power chair. She would like to work with therapy to progress mobility toward sitting if able; however,  declined PT this morning due to nausea/stomach hurting.   She also reports she does not want to go to SNF at d/c.  States she has 24 hr support and necessary DME (hospital bed and hoyer) and is working on [employer] manager. Will f/u as able.  Abran Richard, PT Acute Rehab Services Pager [phone] [facility] [place] 09/08/2020, 10:26 AM

## 2020-09-08 NOTE — Progress Notes (Addendum)
Call received from [place] Stevens in lab with critical potassium of 2.7 and sodium of 104. Dr. Waldron Labs made aware. New order placed for labs. VWilliams,RN.

## 2020-09-08 NOTE — TOC Initial Note (Addendum)
Transition of Care [facility]) - Initial/Assessment Note    Patient Details  Name: [name] [name] MRN: [id number] Date of Birth: 10/29/1955  Transition of [facility]) CM/SW Contact:    Sharin Mons, RN Phone Number: 09/08/2020, 4:58 PM  Clinical Narrative:                 Readmitted with persistent c/o nausea, and found to have hypokalemia. Pt with hx of CKD 3, DM 2, Chronic diastolic CHF, OSA on CPAP, bedbound. PTA lives alone, has aide services through the [place] provided by [facility].  Supportive family: Isaac Bliss [phone]) and Enid Derry [phone])...sisters. Pt gave consent to NCM to discuss with sisters  medical concerns / disposition needs.  Active with [facility] for home health services, RN, PT.  Per PT: Pt is bed bound/hoyer lift with assist of 2 or 3 for at least a year.  She is total care for ADLs and all mobility. She has been to SNF in past and remained hoyer dependent.  Family member was present this afternoon and confirmed baseline, reports trying to get to [facility] for placement due to no longer able to manage at home.  Due to pt at baseline, will sign off at this time.  Recommend long term/custodial care placement.  Pt  is agreeable to LT/CUSTODIAL CARE PLACEMENT.  Sister requested pt be transferred to [facility] hospital 2/2 very concerned with management of  pt'scare . States she was sent home with previous admit just as she presents today, no change. " Something is wrong with my sister. Please help." NCM made sister aware called made to the [place] and spoke with April/ Transfer admission coordinator. [facility] NCM [facility]  [facility] , no beds available.  Pt has a PRIMARY NURSE provided by the [facility] and follows pt monthly, Erin [phone]. Pt is active with the [facility]. PCP: Coy Saunas MD, and [place], [phone] or Leonidas Romberg [place], [phone] ext. [phone].   TOC team following and will assist with TOC needs.....   09/09/2020 8:25 am NCM  received call from April /  [place] transfer care coordinator. April informed NCM  that they are on a diversion for MEDICAL SURGICAL  beds , no beds available , regarding an acute to acute transfer.  Expected Discharge Plan: Long Term Acute Care (LTAC) Barriers to Discharge: Continued Medical Work up   Patient Goals and CMS Choice        Expected Discharge Plan and Services  Expected discharge plan :  Discharge Planning Services: CM Consult   Living arrangements for the past 2 months: Single Family Home                           HH Arranged: RN, PT (Active with [place]) [place]: [place] ([place]) Date [place]: 09/08/20 Time [place]: 1036 Representative spoke with at [place]: Ramond Marrow  Prior Living Arrangements/Services Living arrangements for the past 2 months: [place] with:: Self Patient language and need for interpreter reviewed:: Yes Do you feel safe going back to the place where you live?: No      Need for Family Participation in Patient Care: Yes (Comment) Care giver support system in place?: Yes (comment)   Criminal Activity/Legal Involvement Pertinent to Current Situation/Hospitalization: No - Comment as needed  Activities of Daily Living      Permission Sought/Granted Permission  sought to share information with : Family Supports Permission granted to share information with : Yes, Verbal Permission Granted  Share Information with NAME: Isaac Bliss [phone]  Permission granted to share info w [facility]: [facility]        Emotional Assessment Appearance:: Appears stated age Attitude/Demeanor/Rapport: Lethargic Affect (typically observed): Accepting Orientation: : Oriented to Place, Oriented to  Time, Oriented to Situation, Oriented to Self Alcohol / Substance Use: Not Applicable Psych Involvement: No (comment)  Admission diagnosis:  Hypokalemia [E87.6] Pancreatitis [K85.90] Patient  Active Problem List   Diagnosis Date Noted   Pancreatitis 09/07/2020   Intractable nausea and vomiting 09/07/2020   Functional quadriplegia ([place]) 09/07/2020   Physical debility 08/26/2020   OSA on CPAP 08/26/2020   Constipation    Acute renal failure superimposed on stage 3a chronic kidney disease, unspecified acute renal failure type ([place]) 08/25/2020   Acute renal failure superimposed on stage 3a chronic kidney disease ([place]) 07/30/2020   Lower extremity cellulitis 02/04/2020   Chronic diastolic CHF (congestive heart failure) ([place]) 11/23/2019   Vaginal discharge 11/06/2019   Morbid obesity ([place]) 08/22/2019   Opiate overdose ([place]) 08/20/2019   Cellulitis 10/05/2018   Acute cystitis without hematuria 10/05/2018   Essential hypertension 10/05/2018   Pressure injury of skin 10/05/2018   Hypoglycemia secondary to sulfonylurea 11/21/2016   Substance induced mood disorder ([place]) 07/02/2014   Encephalopathy    Hypokalemia    Type 2 diabetes mellitus with stage 3 chronic kidney disease ([place])    HLD (hyperlipidemia)    Depression    Anxiety state    PCP:  Lavone Orn, MD Pharmacy:   [place], [place] [address] [place] [address] Phone: [phone] Fax: [phone]     Social Determinants of Health (SDOH) Interventions    Readmission Risk Interventions Readmission Risk Prevention Plan 02/06/2020 11/08/2019  Transportation Screening Complete Complete  Medication Review Press photographer) - Referral to Pharmacy  PCP or Specialist appointment within 3-5 days of discharge Complete Complete  HRI or Home Care Consult - Complete  SW Recovery Care/Counseling Consult Complete Complete  Palliative Care Screening Not Applicable Not Applicable  Skilled Nursing Facility Complete Complete  Some recent data might be hidden

## 2020-09-08 NOTE — Consult Note (Signed)
WOC Nurse Consult Note: Patient receiving care in [place] Morbidly obese patient, familiar to the [place] team.  Reason for Consult: Mult ulcers Wound type: Full thickness wound to the right fold under the pannus and left trochanter. Fissures in the fold under the pannus on the left side and close to the groin.  Pressure Injury POA: NA Measurement: Left trochanter 3 x 2 x 0.6 Right pannus 3.8 x 2.5 x 0.7 Wound bed: Pink moist granulation tissue Drainage (amount, consistency, odor) sanguinous drainage on the dressing removed.   Periwound: Intact Dressing procedure/placement/frequency: Apply a piece of Aquacel Advantage [name] # [id number]) on the wounds and over the fissures in the folds on the left side and secure with foam dressing. Change the Aquacel Daily. May use anti-fungal powder in the pannus to prevent moisture.   Monitor the wound area(s) for worsening of condition such as: Signs/symptoms of infection, increase in size, development of or worsening of odor, development of pain, or increased pain at the affected locations.   Notify the medical team if any of these develop.  Thank you for the consult. [place] nurse will not follow at this time.   Please re-consult the [place] team if needed.  Cathlean Marseilles Tamala Julian, MSN, RN, [place], Lysle Pearl, [facility] Wound Treatment Associate Pager [phone]

## 2020-09-08 NOTE — Progress Notes (Signed)
NCM made aware by [facility] transferring coordinator April  pt 's [place] notification ID: [id number]. Pt is active with the [facility]. PCP: Coy Saunas MD, and [place], [phone] or Leonidas Romberg [place], [phone] ext. [phone]. Whitman Hero RN,BSN,CM

## 2020-09-08 NOTE — Progress Notes (Signed)
Patient refused the use of CPAP for th evening.

## 2020-09-08 NOTE — Progress Notes (Signed)
Initial Nutrition Assessment  DOCUMENTATION CODES:   Morbid obesity  INTERVENTION:   -Boost Breeze po TID, each supplement provides 250 kcal and 9 grams of protein  -30 ml Prosource Plus TID, each supplement provides 100 kcals and 15 grams -MVI with minerals daily  NUTRITION DIAGNOSIS:   Inadequate oral intake related to nausea, poor appetite as evidenced by percent weight loss, meal completion < 25%, per patient/family report.  GOAL:   Patient will meet greater than or equal to 90% of their needs  MONITOR:   PO intake, Supplement acceptance, Diet advancement, Labs, Weight trends, Skin, I & O's  REASON FOR ASSESSMENT:   Consult Assessment of nutrition requirement/status  ASSESSMENT:   [name] [name] is a 65 y.o. female with medical history significant of      CKD 3, DM 2, Chronic diastolic CHF, OSA on CPAP  Pt admitted with intractable nausea and vomiting.   Reviewed I/O's: +120 ml x 24 hours  Spoke with pt at bedside, who reports feeling "horrible" secondary to nausea. Per her report, she has not eaten solid food over the past weeks due to no appetite- intake has consisted only of ice chips. Per pt, prior to this, intake was also minimal, but was able to tolerate small amounts of solid foods, such as scrambled eggs. She consumed about a half cup of grape juice this morning. Per pt, she does not think her nausea has improved with medication.   Reviewed wt hx; pt has experienced a 13.2% wt loss over the past year. Pt also has experienced a 12.2% wt loss over the past month, which is significant for time frame. Pt also reports extreme weakness.   Discussed importance of good meal and supplement intake to promote healing. Discussed items available on clear liquids. Pt amenable to supplements, but apprehensive to try anything citrus flavored.   Medications reviewed and include dulcolax, remeron, miralax, carafate, 0.9% sodium chloride infusion @ 75 ml/hr.   Lab Results   Component Value Date   HGBA1C 6.0 (H) 09/08/2020   PTA DM medications are 12.5 mg empagliflozin daily ans 100 mg sitagliptin daily.   Labs reviewed: CBGS: 134 (inpatient orders for glycemic control are 0-9 units insulin aspart every 4 hours).    NUTRITION - FOCUSED PHYSICAL EXAM:  Flowsheet Row Most Recent Value  Orbital Region No depletion  Upper Arm Region No depletion  Thoracic and Lumbar Region No depletion  Buccal Region No depletion  Temple Region No depletion  Clavicle Bone Region No depletion  Clavicle and Acromion Bone Region No depletion  Scapular Bone Region No depletion  Dorsal Hand No depletion  Patellar Region No depletion  Anterior Thigh Region No depletion  Posterior Calf Region No depletion  Edema (RD Assessment) Moderate  Hair Reviewed  Eyes Reviewed  Mouth Reviewed  Skin Reviewed        Diet Order:   Diet Order             Diet clear liquid Room service appropriate? Yes; Fluid consistency: Thin  Diet effective now                   EDUCATION NEEDS:   Education needs have been addressed  Skin:  Skin Assessment: Skin Integrity Issues: Skin Integrity Issues:: Other (Comment) Other: non-pressure wound to lt flank, dehisced abdominal incision  Last BM:  09/08/20  Height:   Ht Readings from Last 1 Encounters:  08/26/20 5\' 2"  (1.575 m)    Weight:   Wt  Readings from Last 1 Encounters:  09/08/20 127.9 kg    Ideal Body Weight:  50 kg  BMI:  Body mass index is 51.57 kg/m.  Estimated Nutritional Needs:   Kcal:  1800-2000  Protein:  110-125 grams  Fluid:  > 1.8 L    Loistine Chance, RD, LDN, [place] Registered Dietitian II Certified Diabetes Care and Education Specialist Please refer to [facility] for RD and/or RD on-call/weekend/after hours pager

## 2020-09-08 NOTE — Progress Notes (Addendum)
PROGRESS NOTE    [name] [name]  TMH:[id number] DOB: April 29, 1955 DOA: 09/07/2020 PCP: Lavone Orn, MD    Chief Complaint  Patient presents with   Abnormal Lab    Brief Narrative:   [name] [name] NEEDS is a 65 y.o. female with medical history significant of    CKD 3, DM 2, Chronic diastolic CHF, OSA on CPAP, patient was sent by home health for abnormal lab, did not specify which 1 exactly, but upon presentation patient reported significant nausea, her work-up significant for dual decline in renal function over the last few weeks, and she was noted to have hypokalemia, she was admitted for further work-up.    Assessment & Plan:   Active Problems:   Hypokalemia   Type 2 diabetes mellitus with stage 3 chronic kidney disease (HCC)   HLD (hyperlipidemia)   Pressure injury of skin   Morbid obesity (HCC)   Chronic diastolic CHF (congestive heart failure) (HCC)   Acute renal failure superimposed on stage 3a chronic kidney disease, unspecified acute renal failure type (HCC)   OSA on CPAP   Pancreatitis   Intractable nausea and vomiting   Functional quadriplegia (HCC)   Intractable nausea  -Clear if this is related to underlying gastroparesis, versus uremia  -Continue with clear liquid diet, continue with as needed Reglan   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    Type 2 diabetes mellitus with stage 3 chronic kidney disease  -A1c  -We will keep on SSI scale  during hospital stay  -Certainly will hold oral agent currently while hospitalized .  Elevated anion gap -in Setting of [place], and renal failure   Pressure injury of skin  -wound care consult  Morbid obesity   - chronic stable   Hypokalemia -Repleted, continue to monitor closely .   HLD (hyperlipidemia)  -chronic stable continue home medications   Chronic diastolic CHF   -Appears to be on a dry side will hold diuretics and gently rehydrate   Acute renal failure superimposed on stage 3a chronic kidney disease, unspecified acute renal failure type ([place]) -gently rehydrate hold diuretics obtain electrolytes   Functional quadriplegia (HCC) - PT/OT asessment patient continues to refuse placement   OSA  -continue CPAP   Elevated lipase  - likely due to dehydration, will repeat lipase in AM      DVT prophylaxis: Heparin Code Status: Full Family Communication: Have discussed with patient, who is coherent, all her questions were answered, none at bedside. Disposition:   Status is: Observation  The patient will require care spanning > 2 midnights and should be moved to inpatient because: IV treatments appropriate due to intensity of illness or inability to take PO  Dispo: The patient is from: Home              Anticipated d/c is to: Home she is refusing SNF              Patient currently is not medically stable to d/c.  He remains IV  fluids, with significant electrolyte abnormalities, with significantly elevated BUN, and hypokalemia, with further requirement of replacement   Difficult to place patient No       Consultants:  D/W renal by phone   Subjective:   Patient does report nausea, no appetite, no chest pain, no shortness of breath.  Objective: Vitals:   09/07/20 2100 09/08/20 0334 09/08/20 0650 09/08/20 0747  BP: 134/76 (!) 118/58 118/71 (!) 122/59  Pulse: (!) 103 (!) 107 100 (!) 103  Resp:  15 16 18   Temp:   98.1 F (36.7 C) 98.4 F (36.9 C)   TempSrc:   Oral Oral  SpO2: 98% 100% 100% 99%    Intake/Output Summary (Last 24 hours) at 09/08/2020 1327 Last data filed at 09/08/2020 0700 Gross per 24 hour  Intake 120 ml  Output --  Net 120 ml   There were no vitals filed for this visit.  Examination:  Awake Alert, Oriented X 3, No new F.N deficits, Normal affect, morbidly obese Symmetrical Chest wall movement, Good air movement bilaterally, CTAB RRR,No Gallops,Rubs or new Murmurs, No Parasternal Heave +ve B.Sounds, Abd Soft, No tenderness, No rebound - guarding or rigidity. No Cyanosis, Clubbing or edema, No new Rash or bruise, with lower abdominal folds wound     Data Reviewed: I have personally reviewed following labs and imaging studies  CBC: Recent Labs  Lab 09/07/20 1528 09/08/20 0208 09/08/20 0548  WBC 9.5 8.8  --   NEUTROABS 6.9 6.4  --   HGB 13.1 13.4 13.9  HCT 41.7 42.6 41.0  MCV 87.8 88.0  --   PLT 349 355  --     Basic Metabolic Panel: Recent Labs  Lab 09/07/20 1528 09/08/20 0208 09/08/20 0548 09/08/20 0729 09/08/20 1133  NA 137 136 136 104* 138  K 3.0* 5.4* 2.8* 2.7* 2.8*  CL 86* 88*  --  68* 90*  CO2 31 30  --  10* 29  GLUCOSE 137* 125*  --  113* 137*  BUN 133* 144*  --  136* 140*  CREATININE 3.17* 3.10*  --  3.01* 3.12*  CALCIUM 11.1* 10.7*  --  9.6 10.7*  MG  --  2.5*  --   --   --   PHOS  --  3.2  --   --   --     GFR: Estimated Creatinine Clearance: 22.7 mL/min (A) (by C-G formula based on SCr of 3.12 mg/dL (H)).  Liver Function Tests: Recent Labs  Lab 09/07/20 1528 09/08/20 0208  AST 24 39  ALT 20 22  ALKPHOS 76 72  BILITOT 1.6* 1.8*  PROT 8.2* 8.0  ALBUMIN 4.2 4.0    CBG: Recent Labs  Lab 09/07/20 2112 09/08/20 0245 09/08/20 1014  GLUCAP 133* 134* 147*     Recent Results (from the past 240 hour(s))  Resp Panel by RT-PCR (Flu A&B, Covid) Nasopharyngeal Swab     Status: None   Collection Time: 09/07/20  8:28 PM   Specimen: Nasopharyngeal Swab;  Nasopharyngeal(NP) swabs in vial transport medium  Result Value Ref Range Status   SARS Coronavirus 2 by RT PCR NEGATIVE NEGATIVE Final    Comment: (NOTE) SARS-CoV-2 target nucleic acids are NOT DETECTED.  The SARS-CoV-2 RNA is generally detectable in upper respiratory specimens during the acute phase of infection. The lowest concentration of SARS-CoV-2 viral copies this assay can detect is 138 copies/mL. A negative result does not preclude SARS-Cov-2 infection and should not be used as the sole basis for treatment  or other patient management decisions. A negative result may occur with  improper specimen collection/handling, submission of specimen other than nasopharyngeal swab, presence of viral mutation(s) within the areas targeted by this assay, and inadequate number of viral copies(<138 copies/mL). A negative result must be combined with clinical observations, patient history, and epidemiological information. The expected result is Negative.  Fact Sheet for Patients:  EntrepreneurPulse.com.au  Fact Sheet for Healthcare Providers:  IncredibleEmployment.be  This test is no t yet approved or cleared by the [place] FDA and  has been authorized for detection and/or diagnosis of SARS-CoV-2 by FDA under an Emergency Use Authorization (EUA). This EUA will remain  in effect (meaning this test can be used) for the duration of the COVID-19 declaration under Section 564(b)(1) of the Act, 21 U.S.C.section 360bbb-3(b)(1), unless the authorization is terminated  or revoked sooner.       Influenza A by PCR NEGATIVE NEGATIVE Final   Influenza B by PCR NEGATIVE NEGATIVE Final    Comment: (NOTE) The Xpert Xpress SARS-CoV-2/FLU/RSV plus assay is intended as an aid in the diagnosis of influenza from Nasopharyngeal swab specimens and should not be used as a sole basis for treatment. Nasal washings and aspirates are unacceptable for Xpert Xpress  SARS-CoV-2/FLU/RSV testing.  Fact Sheet for Patients: EntrepreneurPulse.com.au  Fact Sheet for Healthcare Providers: IncredibleEmployment.be  This test is not yet approved or cleared by the [place] FDA and has been authorized for detection and/or diagnosis of SARS-CoV-2 by FDA under an Emergency Use Authorization (EUA). This EUA will remain in effect (meaning this test can be used) for the duration of the COVID-19 declaration under Section 564(b)(1) of the Act, 21 U.S.C. section 360bbb-3(b)(1), unless the authorization is terminated or revoked.  Performed at [facility], [place] [address], [place], [place] [address]          Radiology Studies: CT Abdomen Pelvis Wo Contrast  Result Date: 09/07/2020 CLINICAL DATA:  Painful abdominal wound EXAM: CT ABDOMEN AND PELVIS WITHOUT CONTRAST TECHNIQUE: Multidetector CT imaging of the abdomen and pelvis was performed following the standard protocol without IV contrast. Unenhanced CT was performed per clinician order. Lack of IV contrast limits sensitivity and specificity, especially for evaluation of abdominal/pelvic solid viscera. COMPARISON:  08/25/2020 FINDINGS: Lower chest: No acute pleural or parenchymal lung disease. Extensive atherosclerosis of the coronary vasculature. Hepatobiliary: No focal liver abnormality is seen. No gallstones, gallbladder wall thickening, or biliary dilatation. Pancreas: Unremarkable. No pancreatic ductal dilatation or surrounding inflammatory changes. Likely embolic coils just ventral to the pancreatic head. Spleen: Normal in size without focal abnormality. Adrenals/Urinary Tract: No urinary tract calculi or obstructive uropathy. Adrenals are grossly unremarkable. The bladder is decompressed, limiting its evaluation. Stomach/Bowel: No bowel obstruction or ileus. Stable colonic diverticulosis without diverticulitis. No bowel wall thickening or inflammatory change.  Vascular/Lymphatic: Aortic atherosclerosis. No enlarged abdominal or pelvic lymph nodes. Reproductive: Status post hysterectomy. No adnexal masses. Other: No free fluid or free gas.  No abdominal wall hernia. Musculoskeletal: No acute or destructive bony lesions. Marked spondylosis at L4-5 unchanged. Minimal subcutaneous fat stranding within the lower anterior abdominal wall within a large pannus likely reflects sequela of prior injection. No fluid collection or abscess. Reconstructed images demonstrate no additional findings. IMPRESSION: 1. No acute abnormality within the abdomen or pelvis to explain the patient's pain on this unenhanced exam. 2. Stable diverticulosis without diverticulitis. 3.  Aortic Atherosclerosis (ICD10-I70.0). Electronically Signed   By: Randa Ngo M.D.   On: 09/07/2020 18:18  CT HEAD WO CONTRAST  Result Date: 09/08/2020 CLINICAL DATA:  Dizziness EXAM: CT HEAD WITHOUT CONTRAST TECHNIQUE: Contiguous axial images were obtained from the base of the skull through the vertex without intravenous contrast. COMPARISON:  11/05/2019 FINDINGS: Brain: No evidence of acute infarction, hemorrhage, hydrocephalus, extra-axial collection or mass lesion/mass effect. Mild cortical atrophy. Mild subcortical white matter and periventricular small vessel ischemic changes. Vascular: Intracranial atherosclerosis. Skull: Normal. Negative for fracture or focal lesion. Sinuses/Orbits: Visualized paranasal sinuses are clear. Chronic opacification of the left mastoid air cells. Other: None. IMPRESSION: No evidence of acute intracranial abnormality. Atrophy with mild small vessel ischemic changes. Chronic left mastoid opacification. Electronically Signed   By: Julian Hy M.D.   On: 09/08/2020 00:16        Scheduled Meds:  atorvastatin  10 mg Oral Daily   bisacodyl  10 mg Rectal Once   heparin  5,000 Units Subcutaneous Q8H   insulin aspart  0-9 Units Subcutaneous Q4H   midodrine  15 mg Oral TID  WC   mirtazapine  7.5 mg Oral QHS   pantoprazole  40 mg Oral Daily   polyethylene glycol  34 g Oral Daily   sucralfate  1 g Oral TID WC & HS   Continuous Infusions:  sodium chloride 75 mL/hr at 09/08/20 1023   potassium chloride       LOS: 0 days      Phillips Climes, MD [facility]   To contact the attending provider between 7A-7P or the covering provider during after hours 7P-7A, please log into the web site [email or web address] and access using universal [place] password for that web site. If you do not have the password, please call the hospital operator.  09/08/2020, 1:27 PM

## 2020-09-09 DIAGNOSIS — N179 Acute kidney failure, unspecified: Secondary | ICD-10-CM | POA: Diagnosis not present

## 2020-09-09 DIAGNOSIS — E876 Hypokalemia: Secondary | ICD-10-CM | POA: Diagnosis not present

## 2020-09-09 DIAGNOSIS — R532 Functional quadriplegia: Secondary | ICD-10-CM | POA: Diagnosis not present

## 2020-09-09 DIAGNOSIS — I5032 Chronic diastolic (congestive) heart failure: Secondary | ICD-10-CM | POA: Diagnosis not present

## 2020-09-09 LAB — CBC
HCT: 40.5 % (ref 36.0–46.0)
Hemoglobin: 12.9 g/dL (ref 12.0–15.0)
MCH: 28.1 pg (ref 26.0–34.0)
MCHC: 31.9 g/dL (ref 30.0–36.0)
MCV: 88.2 fL (ref 80.0–100.0)
Platelets: 316 10*3/uL (ref 150–400)
RBC: 4.59 MIL/uL (ref 3.87–5.11)
RDW: 14.8 % (ref 11.5–15.5)
WBC: 8.5 10*3/uL (ref 4.0–10.5)
nRBC: 0.5 % — ABNORMAL HIGH (ref 0.0–0.2)

## 2020-09-09 LAB — GLUCOSE, CAPILLARY
Glucose-Capillary: 103 mg/dL — ABNORMAL HIGH (ref 70–99)
Glucose-Capillary: 147 mg/dL — ABNORMAL HIGH (ref 70–99)
Glucose-Capillary: 124 mg/dL — ABNORMAL HIGH (ref 70–99)
Glucose-Capillary: 135 mg/dL — ABNORMAL HIGH (ref 70–99)

## 2020-09-09 LAB — BASIC METABOLIC PANEL
Anion gap: 15 (ref 5–15)
BUN: 126 mg/dL — ABNORMAL HIGH (ref 8–23)
CO2: 27 mmol/L (ref 22–32)
Calcium: 10.4 mg/dL — ABNORMAL HIGH (ref 8.9–10.3)
Chloride: 94 mmol/L — ABNORMAL LOW (ref 98–111)
Creatinine, Ser: 2.73 mg/dL — ABNORMAL HIGH (ref 0.44–1.00)
GFR, Estimated: 19 mL/min — ABNORMAL LOW (ref >60)
Glucose, Bld: 107 mg/dL — ABNORMAL HIGH (ref 70–99)
Potassium: 2.9 mmol/L — ABNORMAL LOW (ref 3.5–5.1)
Sodium: 136 mmol/L (ref 135–145)

## 2020-09-09 LAB — MAGNESIUM: Magnesium: 2.4 mg/dL (ref 1.7–2.4)

## 2020-09-09 MED ORDER — POTASSIUM CHLORIDE 10 MEQ/100ML IV SOLN
10.0000 meq | Freq: Once | INTRAVENOUS | Status: AC
  Administered 2020-09-09: 10 meq via INTRAVENOUS
  Filled 2020-09-09: qty 100

## 2020-09-09 MED ORDER — SODIUM CHLORIDE 0.9 % IV SOLN: INTRAVENOUS | Status: DC

## 2020-09-09 MED ORDER — POLYETHYLENE GLYCOL 3350 17 G PO PACK
17.0000 g | PACK | Freq: Two times a day (BID) | ORAL | Status: AC
  Filled 2020-09-09: qty 1

## 2020-09-09 MED ORDER — POTASSIUM CHLORIDE CRYS ER 20 MEQ PO TBCR
40.0000 meq | EXTENDED_RELEASE_TABLET | Freq: Four times a day (QID) | ORAL | Status: DC
Start: 2020-09-09 — End: 2020-09-10
  Administered 2020-09-09: 40 meq via ORAL
  Filled 2020-09-09 (×2): qty 2

## 2020-09-09 MED ORDER — BISACODYL 5 MG PO TBEC
10.0000 mg | DELAYED_RELEASE_TABLET | Freq: Once | ORAL | Status: DC
  Filled 2020-09-09 (×2): qty 2

## 2020-09-09 NOTE — Plan of Care (Signed)
  Problem: Education: Goal: Knowledge of General Education information will improve Description: Including pain rating scale, medication(s)/side effects and non-pharmacologic comfort measures Outcome: Progressing   Problem: Clinical Measurements: Goal: Will remain free from infection Outcome: Progressing   Problem: Nutrition: Goal: Adequate nutrition will be maintained Outcome: Progressing   Problem: Skin Integrity: Goal: Risk for impaired skin integrity will decrease Outcome: Progressing

## 2020-09-09 NOTE — Progress Notes (Signed)
Pt refusing CPAP again tonight. Order will be discontinued per RT protocol. Pt aware that she can still request CPAP at any time, and RT will set up for her.

## 2020-09-09 NOTE — Progress Notes (Signed)
PROGRESS NOTE    [name]  VEL:[id number] DOB: 03-Nov-1955 DOA: 09/07/2020 PCP: Lavone Orn, MD    Chief Complaint  Patient presents with   Abnormal Lab    Brief Narrative:   [name] [name] is a 65 y.o. female with medical history significant of    CKD 3, DM 2, Chronic diastolic CHF, OSA on CPAP, patient was sent by home health for abnormal lab, did not specify which 1 exactly, but upon presentation patient reported significant nausea, her work-up significant for dual decline in renal function over the last few weeks, and she was noted to have hypokalemia, she was admitted for further work-up.    Assessment & Plan:   Active Problems:   Hypokalemia   Type 2 diabetes mellitus with stage 3 chronic kidney disease (HCC)   HLD (hyperlipidemia)   Pressure injury of skin   Morbid obesity (HCC)   Chronic diastolic CHF (congestive heart failure) (HCC)   Acute renal failure superimposed on stage 3a chronic kidney disease, unspecified acute renal failure type (HCC)   OSA on CPAP   Pancreatitis   Intractable nausea and vomiting   Functional quadriplegia (HCC)   Intractable nausea  -Clear if this is related to underlying gastroparesis, versus uremia  -Appears to be improving, she is tolerating clear liquid diet, so she will be advanced to carb modified diet.  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 Moshe Cipro, given urine sodium is<10, and FENa <1%, this appears to be prerenal, recommendation to continue with IV fluids and monitor. -Renal function appears to be improving with IV fluids, will continue at current rate.   Type 2 diabetes mellitus  with stage 3 chronic kidney disease  -A1c settable at 6 -We will keep on SSI scale during hospital stay  -Certainly will hold oral agent currently while hospitalized .  Elevated anion gap -in Setting of [place], and renal failure   Morbid obesity   - chronic stable   Hypokalemia -Repleted, but remains low at 2.9 today, so we will replete further, magnesium within normal limit.   HLD (hyperlipidemia)  -chronic stable continue home medications   Chronic diastolic CHF   -Appears to be on a dry side will hold diuretics and gently rehydrate   Acute renal failure superimposed on stage 3a chronic kidney disease, unspecified acute renal failure type ([facility]) -gently rehydrate hold diuretics obtain electrolytes   Functional quadriplegia (HCC) - PT/OT asessment patient continues to refuse placement   OSA  -continue CPAP   Elevated lipase  - likely due to dehydration, will repeat lipase in AM   Pressure injury of skin  Left trochanter 3 x 2 x 0.6  Right pannus 3.8 x 2.5 x 0.7 -wound care consulted - continue with wound care      DVT prophylaxis: Heparin Code Status: Full Family Communication: D/W sister Essie Disposition:   Status is: Inpatient  The patient will require care spanning > 2 midnights and should be moved to inpatient because: IV treatments appropriate due to intensity of illness or inability to take PO  Dispo: The patient is from: Home  Anticipated d/c is to:  To be determined likely will need SNF versus long-term placement,              Patient currently is not medically stable to d/c.  He remains IV fluids, with significant electrolyte abnormalities, with significantly elevated BUN, and hypokalemia, with further requirement of replacement   Difficult to place patient No       Consultants:  D/W renal by phone   Subjective:  For nausea has improved, will advance to soft diet. Objective: Vitals:   09/08/20 1420 09/08/20 1622 09/08/20 2006  09/09/20 0726  BP: (!) 121/93  (!) 139/58 (!) 143/65  Pulse: 96  89 98  Resp: 18  16 17   Temp: 97.6 F (36.4 C)  97.9 F (36.6 C) 98.7 F (37.1 C)  TempSrc: Oral  Oral Oral  SpO2: 98%  100% 100%  Weight:  127.9 kg     No intake or output data in the 24 hours ending 09/09/20 1233  Filed Weights   09/08/20 1622  Weight: 127.9 kg    Examination:  Awake Alert, Oriented X 3, No new F.N deficits, Normal affect, morbidly obese Symmetrical Chest wall movement, Good air movement bilaterally, CTAB RRR,No Gallops,Rubs or new Murmurs, No Parasternal Heave +ve B.Sounds, Abd Soft, No tenderness, No rebound - guarding or rigidity. No Cyanosis, Clubbing or edema, patient with right abdominal pannus ulcer    Data Reviewed: I have personally reviewed following labs and imaging studies  CBC: Recent Labs  Lab 09/07/20 1528 09/08/20 0208 09/08/20 0548 09/09/20 0208  WBC 9.5 8.8  --  8.5  NEUTROABS 6.9 6.4  --   --   HGB 13.1 13.4 13.9 12.9  HCT 41.7 42.6 41.0 40.5  MCV 87.8 88.0  --  88.2  PLT 349 355  --  482    Basic Metabolic Panel: Recent Labs  Lab 09/08/20 0208 09/08/20 0548 09/08/20 0729 09/08/20 1133 09/08/20 1400 09/09/20 0208  NA 136 136 104* 138 137 136  K 5.4* 2.8* 2.7* 2.8* 3.1* 2.9*  CL 88*  --  68* 90* 86* 94*  CO2 30  --  10* 29 29 27   GLUCOSE 125*  --  113* 137* 126* 107*  BUN 144*  --  136* 140* 139* 126*  CREATININE 3.10*  --  3.01* 3.12* 3.13* 2.73*  CALCIUM 10.7*  --  9.6 10.7* 11.0* 10.4*  MG 2.5*  --   --   --   --  2.4  PHOS 3.2  --   --   --  3.3  --     GFR: Estimated Creatinine Clearance: 25.6 mL/min (A) (by C-G formula based on SCr of 2.73 mg/dL (H)).  Liver Function Tests: Recent Labs  Lab 09/07/20 1528 09/08/20 0208 09/08/20 1400  AST 24 39  --   ALT 20 22  --   ALKPHOS 76 72  --   BILITOT 1.6* 1.8*  --   PROT 8.2* 8.0  --   ALBUMIN 4.2 4.0 3.9    CBG: Recent Labs  Lab 09/08/20 1816 09/08/20 2044 09/08/20 2234  09/09/20 0656 09/09/20 1129  GLUCAP 169* 118* 112* 103* 147*     Recent Results (from the past 240 hour(s))  Resp Panel by RT-PCR (Flu A&B, Covid) Nasopharyngeal Swab     Status: None   Collection Time: 09/07/20  8:28 PM   Specimen: Nasopharyngeal Swab; Nasopharyngeal(NP) swabs in vial transport medium  Result Value Ref Range Status   SARS Coronavirus 2  by RT PCR NEGATIVE NEGATIVE Final    Comment: (NOTE) SARS-CoV-2 target nucleic acids are NOT DETECTED.  The SARS-CoV-2 RNA is generally detectable in upper respiratory specimens during the acute phase of infection. The lowest concentration of SARS-CoV-2 viral copies this assay can detect is 138 copies/mL. A negative result does not preclude SARS-Cov-2 infection and should not be used as the sole basis for treatment or other patient management decisions. A negative result may occur with  improper specimen collection/handling, submission of specimen other than nasopharyngeal swab, presence of viral mutation(s) within the areas targeted by this assay, and inadequate number of viral copies(<138 copies/mL). A negative result must be combined with clinical observations, patient history, and epidemiological information. The expected result is Negative.  Fact Sheet for Patients:  EntrepreneurPulse.com.au  Fact Sheet for Healthcare Providers:  IncredibleEmployment.be  This test is no t yet approved or cleared by the [place] FDA and  has been authorized for detection and/or diagnosis of SARS-CoV-2 by FDA under an Emergency Use Authorization (EUA). This EUA will remain  in effect (meaning this test can be used) for the duration of the COVID-19 declaration under Section 564(b)(1) of the Act, 21 U.S.C.section 360bbb-3(b)(1), unless the authorization is terminated  or revoked sooner.       Influenza A by PCR NEGATIVE NEGATIVE Final   Influenza B by PCR NEGATIVE NEGATIVE Final    Comment:  (NOTE) The Xpert Xpress SARS-CoV-2/FLU/RSV plus assay is intended as an aid in the diagnosis of influenza from Nasopharyngeal swab specimens and should not be used as a sole basis for treatment. Nasal washings and aspirates are unacceptable for Xpert Xpress SARS-CoV-2/FLU/RSV testing.  Fact Sheet for Patients: EntrepreneurPulse.com.au  Fact Sheet for Healthcare Providers: IncredibleEmployment.be  This test is not yet approved or cleared by the [place] FDA and has been authorized for detection and/or diagnosis of SARS-CoV-2 by FDA under an Emergency Use Authorization (EUA). This EUA will remain in effect (meaning this test can be used) for the duration of the COVID-19 declaration under Section 564(b)(1) of the Act, 21 U.S.C. section 360bbb-3(b)(1), unless the authorization is terminated or revoked.  Performed at [facility], [place] [address] [place], [place] [address]          Radiology Studies: CT Abdomen Pelvis Wo Contrast  Result Date: 09/07/2020 CLINICAL DATA:  Painful abdominal wound EXAM: CT ABDOMEN AND PELVIS WITHOUT CONTRAST TECHNIQUE: Multidetector CT imaging of the abdomen and pelvis was performed following the standard protocol without IV contrast. Unenhanced CT was performed per clinician order. Lack of IV contrast limits sensitivity and specificity, especially for evaluation of abdominal/pelvic solid viscera. COMPARISON:  08/25/2020 FINDINGS: Lower chest: No acute pleural or parenchymal lung disease. Extensive atherosclerosis of the coronary vasculature. Hepatobiliary: No focal liver abnormality is seen. No gallstones, gallbladder wall thickening, or biliary dilatation. Pancreas: Unremarkable. No pancreatic ductal dilatation or surrounding inflammatory changes. Likely embolic coils just ventral to the pancreatic head. Spleen: Normal in size without focal abnormality. Adrenals/Urinary Tract: No urinary tract calculi or  obstructive uropathy. Adrenals are grossly unremarkable. The bladder is decompressed, limiting its evaluation. Stomach/Bowel: No bowel obstruction or ileus. Stable colonic diverticulosis without diverticulitis. No bowel wall thickening or inflammatory change. Vascular/Lymphatic: Aortic atherosclerosis. No enlarged abdominal or pelvic lymph nodes. Reproductive: Status post hysterectomy. No adnexal masses. Other: No free fluid or free gas.  No abdominal wall hernia. Musculoskeletal: No acute or destructive bony lesions. Marked spondylosis at L4-5 unchanged. Minimal subcutaneous fat stranding within the lower anterior  abdominal wall within a large pannus likely reflects sequela of prior injection. No fluid collection or abscess. Reconstructed images demonstrate no additional findings. IMPRESSION: 1. No acute abnormality within the abdomen or pelvis to explain the patient's pain on this unenhanced exam. 2. Stable diverticulosis without diverticulitis. 3.  Aortic Atherosclerosis (ICD10-I70.0). Electronically Signed   By: Randa Ngo M.D.   On: 09/07/2020 18:18   CT HEAD WO CONTRAST  Result Date: 09/08/2020 CLINICAL DATA:  Dizziness EXAM: CT HEAD WITHOUT CONTRAST TECHNIQUE: Contiguous axial images were obtained from the base of the skull through the vertex without intravenous contrast. COMPARISON:  11/05/2019 FINDINGS: Brain: No evidence of acute infarction, hemorrhage, hydrocephalus, extra-axial collection or mass lesion/mass effect. Mild cortical atrophy. Mild subcortical white matter and periventricular small vessel ischemic changes. Vascular: Intracranial atherosclerosis. Skull: Normal. Negative for fracture or focal lesion. Sinuses/Orbits: Visualized paranasal sinuses are clear. Chronic opacification of the left mastoid air cells. Other: None. IMPRESSION: No evidence of acute intracranial abnormality. Atrophy with mild small vessel ischemic changes. Chronic left mastoid opacification. Electronically Signed    By: Julian Hy M.D.   On: 09/08/2020 00:16        Scheduled Meds:  (feeding supplement) PROSource Plus  30 mL Oral TID BM   atorvastatin  10 mg Oral Daily   bisacodyl  10 mg Rectal Once   feeding supplement  1 Container Oral TID BM   heparin  5,000 Units Subcutaneous Q8H   insulin aspart  0-9 Units Subcutaneous Q4H   midodrine  15 mg Oral TID WC   mirtazapine  7.5 mg Oral QHS   multivitamin with minerals  1 tablet Oral Daily   pantoprazole  40 mg Oral Daily   polyethylene glycol  34 g Oral BID   potassium chloride  40 mEq Oral Once   senna-docusate  1 tablet Oral BID   sucralfate  1 g Oral TID WC & HS   Continuous Infusions:  sodium chloride 75 mL/hr at 09/09/20 1140     LOS: 1 day      Phillips Climes, MD [facility]   To contact the attending provider between 7A-7P or the covering provider during after hours 7P-7A, please log into the web site [email or web address] and access using universal [place] password for that web site. If you do not have the password, please call the hospital operator.  09/09/2020, 12:33 PM

## 2020-09-09 NOTE — Evaluation (Signed)
Physical Therapy Evaluation Patient Details Name: [name] [name] MRN: [id number] DOB: 11/07/1955 Today's Date: 09/09/2020   History of Present Illness  65 y.o. female presents to [facility] ED on 08/25/2020 with reports of generalized weakness and loss of apetite. Pt recently admitted one month ago with CHF exacerbation, was recommended to discharge to SNF but refused and discharged home. Pt admitted for management of AKI, CHF, sacral ulcers, and acute on chronic debility. PMH includes type 2 diabetes mellitus, chronic diastolic CHF, OSA, depression, anxiety, chronic kidney disease stage III.  Clinical Impression  Pt admitted with above diagnosis. Comes from home where she had friend, boyfriend, and personal care attendant assist;  Pt is bed bound/hoyer lift with assist of 2 or 3 for at least a year.  She is total care for ADLs and all mobility. She has been to SNF in past and remained hoyer dependent; Presents today with limited ability to participate, limited shoulder and LE ROM, limited activity tolerance; Max assist of 2 for rolling; limited PT eval due to need for cleaning, and pt not choosing to pursue OOB to recliner transfer with lift today; Pt currently with functional limitations due to the deficits listed below (see PT Problem List). Will tiral acute PT for 1 or 2 more sessions to see if she can benefit from skilled PT to increase their independence and safety with mobility, or decr caregiver burden,  to allow discharge to the venue listed below.  Recommend long term/custodial care placement     Follow Up Recommendations SNF (Considering home situation, will need SNF for Long-Term Care) Recommend long term/custodial care placement   Equipment Recommendations  Other (comment) (in chart review, noted a mention of pt getting a bari hospital bed and power chair -- I'm curious if these will come to fruition)    Recommendations for Other Services       Precautions / Restrictions  Precautions Precautions: Fall;Other (comment) Precaution Comments: Skin integrity and contractures      Mobility  Bed Mobility Overal bed mobility: Needs Assistance Bed Mobility: Rolling Rolling: Max assist         General bed mobility comments: Noting limited effort; less holding self in sidelying with rail with roll to L; PT assisted with bed mobility for hygeine, and to help with cleaning up (soiled in loose BM)    Transfers                 General transfer comment: Pt declined OOB transfer with lift this date  Ambulation/Gait                Stairs            Wheelchair Mobility    Modified Rankin (Stroke Patients Only)       Balance                                             Pertinent Vitals/Pain Pain Assessment: Faces Faces Pain Scale: Hurts whole lot Pain Location: wounds in skin folds, R vaginal labum, pain in feet to touch, R shoulder Pain Descriptors / Indicators: Grimacing;Tender;Sharp Pain Intervention(s): Monitored during session    Home Living Family/patient expects to be discharged to:: Private residence Living Arrangements: Alone Available Help at Discharge: Friend(s);Personal care attendant Type of Home: Apartment Home Access: Ramped entrance     Home Layout: One level Home Equipment: Hospital bed;Walker -  4 wheels;Wheelchair - Education administrator (comment);Shower seat (mechanical lift) Additional Comments: per chart reiew, as of 08/27/2020: Patient states her current bed is being switched out for a bariatric bed.  she states she has been approved for an Transport planner    Prior Function Level of Independence: Needs assistance   Gait / Transfers Assistance Needed: Bed bound  ADL's / Homemaking Assistance Needed: Pt requires assistance for all ADLs which are performed at bed level.  She needs assist with meals, meds, and community mobility.        Hand Dominance   Dominant Hand: Right     Extremity/Trunk Assessment   Upper Extremity Assessment Upper Extremity Assessment: Generalized weakness RUE Deficits / Details: limited shoulder flexion LUE Deficits / Details: limited shoulder flexion    Lower Extremity Assessment Lower Extremity Assessment: Generalized weakness;RLE deficits/detail;LLE deficits/detail RLE Deficits / Details: Noting limited knee flexion to approx 10 deg of flexion RLE: Unable to fully assess due to pain LLE: Unable to fully assess due to pain    Cervical / Trunk Assessment Cervical / Trunk Assessment: Other exceptions Cervical / Trunk Exceptions: morbid obesity  Communication   Communication: No difficulties  Cognition Arousal/Alertness: Awake/alert Behavior During Therapy: WFL for tasks assessed/performed Overall Cognitive Status: Within Functional Limits for tasks assessed                                 General Comments: Did not talk much during session      General Comments General comments (skin integrity, edema, etc.): Limited eval today to bed level as pt required washing up after having a BM    Exercises     Assessment/Plan    PT Assessment Patient needs continued PT services (on a trial basis, pending participation)  PT Problem List Decreased strength;Decreased range of motion;Decreased balance;Decreased activity tolerance;Decreased mobility;Pain       PT Treatment Interventions DME instruction;Functional mobility training;Therapeutic activities;Therapeutic exercise;Balance training;Patient/family education    PT Goals (Current goals can be found in the Care Plan section)  Acute Rehab PT Goals Patient Stated Goal: Did not state PT Goal Formulation: Patient unable to participate in goal setting Time For Goal Achievement: 09/23/20 Potential to Achieve Goals: Poor    Frequency Min 1X/week (for 1 or 2 more sessions)   Barriers to discharge Other (comment) Pt's current home situation is not sustainable     Co-evaluation               AM-PAC PT "6 Clicks" Mobility  Outcome Measure Help needed turning from your back to your side while in a flat bed without using bedrails?: Total Help needed moving from lying on your back to sitting on the side of a flat bed without using bedrails?: Total Help needed moving to and from a bed to a chair (including a wheelchair)?: Total Help needed standing up from a chair using your arms (e.g., wheelchair or bedside chair)?: Total Help needed to walk in hospital room?: Total Help needed climbing 3-5 steps with a railing? : Total 6 Click Score: 6    End of Session   Activity Tolerance: Patient limited by pain Patient left: in bed;with call bell/phone within reach;with nursing/sitter in room Nurse Communication: Mobility status;Need for lift equipment PT Visit Diagnosis: Other abnormalities of gait and mobility (R26.89);Pain Pain - Right/Left:  (multiple wounds bilaterally)    Time: 1452-1510 PT Time Calculation (min) (ACUTE ONLY): 18 min   Charges:  PT Evaluation $PT Eval Moderate Complexity: 1 Mod          Roney [place], [place]  Acute Rehabilitation Services Pager [phone] Office [phone]   [place]letta [place] 09/09/2020, 6:26 PM

## 2020-09-09 NOTE — Plan of Care (Signed)
  Problem: Education: Goal: Knowledge of General Education information will improve Description Including pain rating scale, medication(s)/side effects and non-pharmacologic comfort measures Outcome: Progressing   Problem: Health Behavior/Discharge Planning: Goal: Ability to manage health-related needs will improve Outcome: Progressing   

## 2020-09-09 NOTE — TOC Progression Note (Signed)
Transition of Care [facility]) - Progression Note    Patient Details  Name: APPLE DEARMAS MRN: [id number] Date of Birth: [birth year]  Transition of Care [facility]) CM/SW [place], RN Phone Number: 09/09/2020, 9:45 AM  Clinical Narrative:    Case management spoke with Whitman Hero, RNCM this morning and received brief report on patient's transitions of care needs and request to place the patient on the DTP list at this time.  CM called and left a message with April, CM at [facility] to coordinate SNF facilities that may be covered under the patient's VA benefits for placement.   CM and MSW will continue to follow the patient for transitions of care needs.  Expected Discharge Plan: Long Term Acute Care (LTAC) Barriers to Discharge: Continued Medical Work up  Expected Discharge Plan and Services Expected Discharge Plan: [place] (LTAC)   Discharge Planning Services: CM Consult   Living arrangements for the past 2 months: Single Family Home                           HH Arranged: RN, PT (Active with [place]) [place]: [place] ([facility]) Date [place]: 09/08/20 Time [place]: 1036 Representative spoke with at [place]: [place] ([place]) Interventions    Readmission Risk Interventions Readmission Risk Prevention Plan 02/06/2020 11/08/2019  Transportation Screening Complete Complete  Medication Review Press photographer) - Referral to Pharmacy  PCP or Specialist appointment within 3-5 days of discharge Complete Complete  HRI or [place] - Complete  SW Recovery Care/Counseling Consult Complete Complete  Palliative Care Screening Not Applicable Not Applicable  Skilled Nursing Facility Complete Complete  Some recent data might be hidden

## 2020-09-10 DIAGNOSIS — N179 Acute kidney failure, unspecified: Secondary | ICD-10-CM | POA: Diagnosis not present

## 2020-09-10 DIAGNOSIS — N1831 Chronic kidney disease, stage 3a: Secondary | ICD-10-CM | POA: Diagnosis not present

## 2020-09-10 LAB — CBC
HCT: 41.5 % (ref 36.0–46.0)
Hemoglobin: 12.8 g/dL (ref 12.0–15.0)
MCH: 27.5 pg (ref 26.0–34.0)
MCHC: 30.8 g/dL (ref 30.0–36.0)
MCV: 89.1 fL (ref 80.0–100.0)
Platelets: 326 10*3/uL (ref 150–400)
RBC: 4.66 MIL/uL (ref 3.87–5.11)
RDW: 15.1 % (ref 11.5–15.5)
WBC: 8.4 10*3/uL (ref 4.0–10.5)
nRBC: 0.2 % (ref 0.0–0.2)

## 2020-09-10 LAB — GLUCOSE, CAPILLARY
Glucose-Capillary: 102 mg/dL — ABNORMAL HIGH (ref 70–99)
Glucose-Capillary: 100 mg/dL — ABNORMAL HIGH (ref 70–99)
Glucose-Capillary: 146 mg/dL — ABNORMAL HIGH (ref 70–99)
Glucose-Capillary: 107 mg/dL — ABNORMAL HIGH (ref 70–99)
Glucose-Capillary: 136 mg/dL — ABNORMAL HIGH (ref 70–99)

## 2020-09-10 MED ORDER — POTASSIUM CHLORIDE 10 MEQ/100ML IV SOLN
10.0000 meq | INTRAVENOUS | Status: AC
  Administered 2020-09-10 (×3): 10 meq via INTRAVENOUS
  Filled 2020-09-10 (×2): qty 100

## 2020-09-10 NOTE — Progress Notes (Signed)
Patient refused medications over night, stating they are making her sick to her stomach and she cant "hold anything down." Educated patient on importance of following medication regimen ordered by MD.  Will advise day-shift RN to contact MD about getting potassium replacement orders via IV, since the patient will not take it orally.

## 2020-09-10 NOTE — Progress Notes (Signed)
PROGRESS NOTE    [name] [name]  OIN:[id number] DOB: 05-14-1955 DOA: 09/07/2020 PCP: Lavone Orn, MD    Chief Complaint  Patient presents with   Abnormal Lab    Brief Narrative:   [name] [name] is a 65 y.o. female with medical history significant of    CKD 3, DM 2, Chronic diastolic CHF, OSA on CPAP, patient was sent by home health for abnormal lab, did not specify which 1 exactly, but upon presentation patient reported significant nausea, her work-up significant for dual decline in renal function over the last few weeks, and she was noted to have hypokalemia, she was admitted for further work-up.  09/10/2020: Seen and examined at bedside.  She reports having some nausea and epigastric pain.  Her last bowel movement was yesterday.  Patient has a history of upper GI bleed.  We will obtain an FOBT.  Assessment & Plan:   Active Problems:   Hypokalemia   Type 2 diabetes mellitus with stage 3 chronic kidney disease (HCC)   HLD (hyperlipidemia)   Pressure injury of skin   Morbid obesity (HCC)   Chronic diastolic CHF (congestive heart failure) (HCC)   Acute renal failure superimposed on stage 3a chronic kidney disease, unspecified acute renal failure type (HCC)   OSA on CPAP   Pancreatitis   Intractable nausea and vomiting   Functional quadriplegia (HCC)   Intractable nausea  -Clear if this is related to underlying gastroparesis, versus uremia  -Appears to be improving, she is tolerating clear liquid diet, so she will be advanced to carb modified diet. She reports persistent nausea this morning with no vomiting. 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 Moshe Cipro, given urine sodium is<10, and FENa <1%, this appears to be prerenal, recommendation to continue with IV fluids and monitor. -Renal function appears to be improving with IV fluids, will continue at current rate.  History of upper GI bleed with Derin clot duodenal ulcer seen on EGD done on 11/15/2019 Continue Protonix Avoid NSAIDs Obtain FOBT.  Hypokalemia likely secondary to GI losses with vomiting Repleted intravenously Recheck   Type 2 diabetes mellitus with stage 3 chronic kidney disease  -A1c settable at 6 -We will keep on SSI scale during hospital stay  -Certainly will hold oral agent currently while hospitalized .  Elevated anion gap -in Setting of Jardiance, and renal failure   Super morbid obesity   BMI 51. Recommend weight loss outpatient with regular physical activity and healthy dieting.   Hypokalemia -Repleted, but remains low at 2.9 today, so we will replete further, magnesium within normal limit.   HLD (hyperlipidemia)  -chronic stable continue home medications   Chronic diastolic CHF   -Appears to be on a dry side will hold diuretics and gently rehydrate   Acute renal failure superimposed on stage 3a chronic kidney disease, unspecified acute renal failure type ([place]) -gently rehydrate hold diuretics obtain electrolytes   Functional quadriplegia (HCC) - PT/OT asessment patient continues to refuse placement   OSA  -continue CPAP   Elevated lipase  - likely due to dehydration, will repeat lipase in AM   Pressure injury of skin  Left trochanter 3 x 2 x 0.6  Right pannus 3.8 x 2.5 x 0.7 -wound care consulted Continue local wound care.  Physical debility/medical discussion PT assessed and recommended SNF, likely long-term/custodial care placement.    DVT prophylaxis: Heparin Code Status: Full Family Communication: D/W sister [phone].  Son Dellis Filbert: [phone]. Disposition:   Status is:  Inpatient  The patient will require care spanning > 2 midnights and should be moved to inpatient because: IV treatments appropriate due to intensity of illness or inability to take PO  Dispo: The patient is from: Home              Anticipated d/c is to:  To be determined likely will need SNF versus long-term placement,              Patient currently is not medically stable to d/c.  He remains IV fluids, with significant electrolyte abnormalities, with significantly elevated BUN, and hypokalemia, with further requirement of replacement   Difficult to place patient No       Consultants:  D/W renal by phone   Objective: Vitals:   09/09/20 0726 09/09/20 1549 09/09/20 1953 09/10/20 0739  BP: (!) 143/65 (!) 131/47 (!) 124/45 (!) 121/54  Pulse: 98 98 93 (!) 105  Resp: 17 16 16    Temp: 98.7 F (37.1 C) 98.2 F (36.8 C) 98.2 F (36.8 C) 97.6 F (36.4 C)  TempSrc: Oral Oral Oral Oral  SpO2: 100% 100% 99% 96%  Weight:        Intake/Output Summary (Last 24 hours) at 09/10/2020 0752 Last data filed at 09/10/2020 0427 Gross per 24 hour  Intake 889.43 ml  Output 400 ml  Net 489.43 ml    Filed Weights   09/08/20 1622  Weight: 127.9 kg    Examination:  General: Morbidly obese in no acute distress.  She is alert and oriented x3.   Respiratory: Clear to auscultation no wheezes or rales.  Good inspiratory effort. Cardiac: Regular rate and rhythm no rubs or gallops.  No JVD abdominal pain noted.   GI: Nondistended epigastric tenderness.  Bowel sounds present.  Musculoskeletal: Trace edema.  No cyanosis or clubbing. Psych: Mood is appropriate for condition and setting. Skin: No ulcerative lesions seen.    Data Reviewed: I have personally reviewed following labs and imaging studies  CBC: Recent Labs  Lab 09/07/20 1528 09/08/20 0208 09/08/20 0548 09/09/20 0208  WBC 9.5 8.8  --  8.5  NEUTROABS 6.9 6.4  --   --   HGB 13.1 13.4 13.9 12.9  HCT 41.7 42.6 41.0 40.5  MCV 87.8  88.0  --  88.2  PLT 349 355  --  211    Basic Metabolic Panel: Recent Labs  Lab 09/08/20 0208 09/08/20 0548 09/08/20 0729 09/08/20 1133 09/08/20 1400 09/09/20 0208  NA 136 136 104* 138 137 136  K 5.4* 2.8* 2.7* 2.8* 3.1* 2.9*  CL 88*  --  68* 90* 86* 94*  CO2 30  --  10* 29 29 27   GLUCOSE 125*  --  113* 137* 126* 107*  BUN 144*  --  136* 140* 139* 126*  CREATININE 3.10*  --  3.01* 3.12* 3.13* 2.73*  CALCIUM 10.7*  --  9.6 10.7* 11.0* 10.4*  MG 2.5*  --   --   --   --  2.4  PHOS 3.2  --   --   --  3.3  --     GFR: Estimated Creatinine Clearance: 25.6 mL/min (A) (by  C-G formula based on SCr of 2.73 mg/dL (H)).  Liver Function Tests: Recent Labs  Lab 09/07/20 1528 09/08/20 0208 09/08/20 1400  AST 24 39  --   ALT 20 22  --   ALKPHOS 76 72  --   BILITOT 1.6* 1.8*  --   PROT 8.2* 8.0  --   ALBUMIN 4.2 4.0 3.9    CBG: Recent Labs  Lab 09/09/20 1129 09/09/20 1639 09/09/20 2054 09/10/20 0423 09/10/20 0743  GLUCAP 147* 124* 135* 102* 100*     Recent Results (from the past 240 hour(s))  Resp Panel by RT-PCR (Flu A&B, Covid) Nasopharyngeal Swab     Status: None   Collection Time: 09/07/20  8:28 PM   Specimen: Nasopharyngeal Swab; Nasopharyngeal(NP) swabs in vial transport medium  Result Value Ref Range Status   SARS Coronavirus 2 by RT PCR NEGATIVE NEGATIVE Final    Comment: (NOTE) SARS-CoV-2 target nucleic acids are NOT DETECTED.  The SARS-CoV-2 RNA is generally detectable in upper respiratory specimens during the acute phase of infection. The lowest concentration of SARS-CoV-2 viral copies this assay can detect is 138 copies/mL. A negative result does not preclude SARS-Cov-2 infection and should not be used as the sole basis for treatment or other patient management decisions. A negative result may occur with  improper specimen collection/handling, submission of specimen other than nasopharyngeal swab, presence of viral mutation(s) within the areas  targeted by this assay, and inadequate number of viral copies(<138 copies/mL). A negative result must be combined with clinical observations, patient history, and epidemiological information. The expected result is Negative.  Fact Sheet for Patients:  EntrepreneurPulse.com.au  Fact Sheet for Healthcare Providers:  IncredibleEmployment.be  This test is no t yet approved or cleared by the [place] FDA and  has been authorized for detection and/or diagnosis of SARS-CoV-2 by FDA under an Emergency Use Authorization (EUA). This EUA will remain  in effect (meaning this test can be used) for the duration of the COVID-19 declaration under Section 564(b)(1) of the Act, 21 U.S.C.section 360bbb-3(b)(1), unless the authorization is terminated  or revoked sooner.       Influenza A by PCR NEGATIVE NEGATIVE Final   Influenza B by PCR NEGATIVE NEGATIVE Final    Comment: (NOTE) The Xpert Xpress SARS-CoV-2/FLU/RSV plus assay is intended as an aid in the diagnosis of influenza from Nasopharyngeal swab specimens and should not be used as a sole basis for treatment. Nasal washings and aspirates are unacceptable for Xpert Xpress SARS-CoV-2/FLU/RSV testing.  Fact Sheet for Patients: EntrepreneurPulse.com.au  Fact Sheet for Healthcare Providers: IncredibleEmployment.be  This test is not yet approved or cleared by the [place] FDA and has been authorized for detection and/or diagnosis of SARS-CoV-2 by FDA under an Emergency Use Authorization (EUA). This EUA will remain in effect (meaning this test can be used) for the duration of the COVID-19 declaration under Section 564(b)(1) of the Act, 21 U.S.C. section 360bbb-3(b)(1), unless the authorization is terminated or revoked.  Performed at [facility], [place] [address], [place], [place] [address]          Radiology Studies: No results  found.      Scheduled Meds:  (feeding supplement) PROSource Plus  30 mL Oral TID BM   atorvastatin  10 mg Oral Daily   bisacodyl  10 mg Oral Once   bisacodyl  10 mg Rectal Once   feeding supplement  1 Container Oral TID BM   heparin  5,000 Units Subcutaneous Q8H  insulin aspart  0-9 Units Subcutaneous Q4H   midodrine  15 mg Oral TID WC   mirtazapine  7.5 mg Oral QHS   multivitamin with minerals  1 tablet Oral Daily   pantoprazole  40 mg Oral Daily   polyethylene glycol  17 g Oral BID   senna-docusate  1 tablet Oral BID   sucralfate  1 g Oral TID WC & HS   Continuous Infusions:  sodium chloride 75 mL/hr at 09/09/20 2332     LOS: 2 days      Kayleen Memos, MD [facility]   To contact the attending provider between 7A-7P or the covering provider during after hours 7P-7A, please log into the web site [email or web address] and access using universal [place] password for that web site. If you do not have the password, please call the hospital operator.  09/10/2020, 7:52 AM

## 2020-09-11 DIAGNOSIS — N1831 Chronic kidney disease, stage 3a: Secondary | ICD-10-CM | POA: Diagnosis not present

## 2020-09-11 DIAGNOSIS — N179 Acute kidney failure, unspecified: Secondary | ICD-10-CM | POA: Diagnosis not present

## 2020-09-11 LAB — RENAL FUNCTION PANEL
Albumin: 3.4 g/dL — ABNORMAL LOW (ref 3.5–5.0)
Anion gap: 11 (ref 5–15)
BUN: 63 mg/dL — ABNORMAL HIGH (ref 8–23)
CO2: 25 mmol/L (ref 22–32)
Calcium: 9.9 mg/dL (ref 8.9–10.3)
Chloride: 105 mmol/L (ref 98–111)
Creatinine, Ser: 1.73 mg/dL — ABNORMAL HIGH (ref 0.44–1.00)
GFR, Estimated: 32 mL/min — ABNORMAL LOW (ref >60)
Glucose, Bld: 126 mg/dL — ABNORMAL HIGH (ref 70–99)
Phosphorus: 1.4 mg/dL — ABNORMAL LOW (ref 2.5–4.6)
Potassium: 3.1 mmol/L — ABNORMAL LOW (ref 3.5–5.1)
Sodium: 141 mmol/L (ref 135–145)

## 2020-09-11 LAB — GLUCOSE, CAPILLARY
Glucose-Capillary: 102 mg/dL — ABNORMAL HIGH (ref 70–99)
Glucose-Capillary: 104 mg/dL — ABNORMAL HIGH (ref 70–99)
Glucose-Capillary: 120 mg/dL — ABNORMAL HIGH (ref 70–99)
Glucose-Capillary: 129 mg/dL — ABNORMAL HIGH (ref 70–99)
Glucose-Capillary: 137 mg/dL — ABNORMAL HIGH (ref 70–99)
Glucose-Capillary: 158 mg/dL — ABNORMAL HIGH (ref 70–99)

## 2020-09-11 MED ORDER — POTASSIUM CHLORIDE 10 MEQ/100ML IV SOLN
10.0000 meq | INTRAVENOUS | Status: DC
Start: 2020-09-11 — End: 2020-09-11

## 2020-09-11 MED ORDER — DIPHENHYDRAMINE HCL 50 MG/ML IJ SOLN
12.5000 mg | Freq: Once | INTRAMUSCULAR | Status: AC
  Administered 2020-09-11: 12.5 mg via INTRAVENOUS
  Filled 2020-09-11: qty 1

## 2020-09-11 MED ORDER — DIPHENHYDRAMINE HCL 50 MG/ML IJ SOLN
25.0000 mg | Freq: Four times a day (QID) | INTRAMUSCULAR | Status: DC | PRN
  Administered 2020-09-11 – 2020-09-16 (×5): 25 mg via INTRAVENOUS
  Filled 2020-09-11 (×5): qty 1

## 2020-09-11 MED ORDER — CAMPHOR-MENTHOL 0.5-0.5 % EX LOTN
TOPICAL_LOTION | CUTANEOUS | Status: DC | PRN
  Administered 2020-09-11: 1 via TOPICAL
  Filled 2020-09-11: qty 222

## 2020-09-11 MED ORDER — POTASSIUM PHOSPHATES 15 MMOLE/5ML IV SOLN
30.0000 mmol | Freq: Once | INTRAVENOUS | Status: AC
  Administered 2020-09-11: 30 mmol via INTRAVENOUS
  Filled 2020-09-11: qty 10

## 2020-09-11 MED ORDER — PANTOPRAZOLE SODIUM 40 MG IV SOLR
40.0000 mg | INTRAVENOUS | Status: DC
  Administered 2020-09-11 – 2020-09-20 (×10): 40 mg via INTRAVENOUS
  Filled 2020-09-11 (×12): qty 40

## 2020-09-11 MED ORDER — LORAZEPAM 2 MG/ML IJ SOLN
0.5000 mg | Freq: Once | INTRAMUSCULAR | Status: AC
  Administered 2020-09-11: 0.5 mg via INTRAVENOUS
  Filled 2020-09-11: qty 1

## 2020-09-11 NOTE — Progress Notes (Addendum)
PROGRESS NOTE    [name] [name]  UKG:[id number] DOB: 01-29-1956 DOA: 09/07/2020 PCP: Lavone Orn, MD    Chief Complaint  Patient presents with   Abnormal Lab    Brief Narrative:   [name] [name] is a 65 y.o. female with medical history significant of    CKD 3, DM 2, Chronic diastolic CHF, OSA on CPAP, patient was sent by home health for abnormal lab, did not specify which 1 exactly, but upon presentation patient reported significant nausea, her work-up significant for decline in renal function over the last few weeks, additionally she was noted to have hypokalemia, she was admitted for further work-up.  09/11/2020: Seen and examined at bedside this morning.  She reports still having dry heaving.  Lab work improving however still symptomatic.  GI consulted to assist with the management of her intractable nausea and vomiting.  Early afternoon, patient reported itching all over.  Iv benadryl PRN and Sarna topical added.  Assessment & Plan:   Active Problems:   Hypokalemia   Type 2 diabetes mellitus with stage 3 chronic kidney disease (HCC)   HLD (hyperlipidemia)   Pressure injury of skin   Morbid obesity (HCC)   Chronic diastolic CHF (congestive heart failure) (HCC)   Acute renal failure superimposed on stage 3a chronic kidney disease, unspecified acute renal failure type (HCC)   OSA on CPAP   Pancreatitis   Intractable nausea and vomiting   Functional quadriplegia (HCC)  Intractable nausea and vomiting She reports persistent nausea with dry heaving last yesterday morning. Epigastric tenderness on palpation. Lab work stable but still symptomatic. On maintenance IV fluids to avoid dehydration IV antiemetics as needed GI [place] consulted.  Improving AKI in CKD stage IIIB suspect prerenal in the setting of dehydration from poor oral intake. Renal function improving with IV fluid hydration. She presented with creatinine of 3.17 with GFR 15. Her creatinine has improved  to 1.73 with GFR of 32. Continue gentle IV fluid hydration Continue to avoid nephrotoxic agents, dehydration and hypotension.  History of upper GI bleed with duodenal ulcer seen on EGD done on 11/15/2019 by Dr. Benson [place]. Switch home p.o. Protonix to IV Protonix as she has not been able to tolerate oral intake without dry heaving. Continue Protonix Avoid NSAIDs Obtain FOBT.  [place]itus IV Benadryl as needed Sarna topical as needed  Hypokalemia likely secondary to GI losses with vomiting Replete intravenously with IV KCl 10 mEq x 3 doses Recheck level in the morning.   Type 2 diabetes mellitus with stage 3 chronic kidney disease  -A1c settable at 6 -We will keep on SSI scale during hospital stay  -Certainly will hold oral agent currently while hospitalized .  Elevated anion gap -in Setting of Jardiance, and renal failure   Super morbid obesity   BMI 51. Recommend weight loss outpatient with regular physical activity and healthy dieting.   HLD (hyperlipidemia)  -chronic stable continue home medications   Chronic diastolic CHF   -Appears to be on a dry side will hold diuretics and gently rehydrate Strict I's and O's and daily weight Net INO +2.0 L, decrease rate of IV fluid normal saline, reduced to 50 cc/h   Functional quadriplegia (HCC) - PT/OT asessment patient continues to refuse placement   OSA  -continue CPAP   Elevated lipase  - likely due to dehydration, will repeat lipase in AM   Pressure injury of skin  Left trochanter 3 x 2 x 0.6  Right pannus 3.8 x 2.5 x 0.7 -  wound care consulted Continue local wound care.  Physical debility/medical discussion PT assessed and recommended SNF, likely long-term/custodial care placement.    DVT prophylaxis: Heparin subcu 3 times daily Code Status: Full Family Communication: D/W sister [phone].  Son Dellis Filbert: [phone]. Disposition:   Status is: Inpatient  The patient will require care spanning > 2 midnights  and should be moved to inpatient because: IV treatments appropriate due to intensity of illness or inability to take PO  Dispo: The patient is from: Home              Anticipated d/c is to:  To be determined likely will need SNF versus long-term placement,              Patient currently is not medically stable to d/c.  He remains IV fluids, with significant electrolyte abnormalities, with significantly elevated BUN, and hypokalemia, with further requirement of replacement   Difficult to place patient No       Consultants:  D/W renal by phone GI [place] consulted   Objective: Vitals:   09/10/20 0739 09/10/20 1518 09/10/20 2244 09/11/20 0715  BP: (!) 121/54 (!) 96/45 (!) 112/51 (!) 124/55  Pulse: (!) 105 90 87 92  Resp:   18 16  Temp: 97.6 F (36.4 C) 98.3 F (36.8 C) 97.9 F (36.6 C) 98 F (36.7 C)  TempSrc: Oral Oral Oral Oral  SpO2: 96% 97% 100% 100%  Weight:        Intake/Output Summary (Last 24 hours) at 09/11/2020 0721 Last data filed at 09/10/2020 1500 Gross per 24 hour  Intake 1487.75 ml  Output --  Net 1487.75 ml    Filed Weights   09/08/20 1622  Weight: 127.9 kg    Examination:  General: Super morbidly obese in no acute distress.  She is alert and oriented x4.  Respiratory: Clear to auscultation no wheezes or rales.  Good inspiratory effort.  Cardiac: Regular rate and rhythm no rubs or gallops.  No JVD or thyromegaly noted.  GI: Obese nondistended.  Epigastric tenderness with palpation.  Bowel sounds present. Musculoskeletal: Trace edema in lower extremities bilaterally.  No cyanosis or clubbing.   Psych: Mood is appropriate for condition and setting. Skin: No ulcerative lesions seen.   Data Reviewed: I have personally reviewed following labs and imaging studies  CBC: Recent Labs  Lab 09/07/20 1528 09/08/20 0208 09/08/20 0548 09/09/20 0208 09/10/20 0750  WBC 9.5 8.8  --  8.5 8.4  NEUTROABS 6.9 6.4  --   --   --   HGB 13.1 13.4 13.9 12.9 12.8   HCT 41.7 42.6 41.0 40.5 41.5  MCV 87.8 88.0  --  88.2 89.1  PLT 349 355  --  316 696    Basic Metabolic Panel: Recent Labs  Lab 09/08/20 0208 09/08/20 0548 09/08/20 0729 09/08/20 1133 09/08/20 1400 09/09/20 0208 09/11/20 0338  NA 136   < > 104* 138 137 136 141  K 5.4*   < > 2.7* 2.8* 3.1* 2.9* 3.1*  CL 88*  --  68* 90* 86* 94* 105  CO2 30  --  10* 29 29 27 25   GLUCOSE 125*  --  113* 137* 126* 107* 126*  BUN 144*  --  136* 140* 139* 126* 63*  CREATININE 3.10*  --  3.01* 3.12* 3.13* 2.73* 1.73*  CALCIUM 10.7*  --  9.6 10.7* 11.0* 10.4* 9.9  MG 2.5*  --   --   --   --  2.4  --  PHOS 3.2  --   --   --  3.3  --  1.4*   < > = values in this interval not displayed.    GFR: Estimated Creatinine Clearance: 40.5 mL/min (A) (by C-G formula based on SCr of 1.73 mg/dL (H)).  Liver Function Tests: Recent Labs  Lab 09/07/20 1528 09/08/20 0208 09/08/20 1400 09/11/20 0338  AST 24 39  --   --   ALT 20 22  --   --   ALKPHOS 76 72  --   --   BILITOT 1.6* 1.8*  --   --   PROT 8.2* 8.0  --   --   ALBUMIN 4.2 4.0 3.9 3.4*    CBG: Recent Labs  Lab 09/10/20 1521 09/10/20 2058 09/11/20 0023 09/11/20 0413 09/11/20 0718  GLUCAP 107* 136* 102* 120* 104*     Recent Results (from the past 240 hour(s))  Resp Panel by RT-PCR (Flu A&B, Covid) Nasopharyngeal Swab     Status: None   Collection Time: 09/07/20  8:28 PM   Specimen: Nasopharyngeal Swab; Nasopharyngeal(NP) swabs in vial transport medium  Result Value Ref Range Status   SARS Coronavirus 2 by RT PCR NEGATIVE NEGATIVE Final    Comment: (NOTE) SARS-CoV-2 target nucleic acids are NOT DETECTED.  The SARS-CoV-2 RNA is generally detectable in upper respiratory specimens during the acute phase of infection. The lowest concentration of SARS-CoV-2 viral copies this assay can detect is 138 copies/mL. A negative result does not preclude SARS-Cov-2 infection and should not be used as the sole basis for treatment or other patient  management decisions. A negative result may occur with  improper specimen collection/handling, submission of specimen other than nasopharyngeal swab, presence of viral mutation(s) within the areas targeted by this assay, and inadequate number of viral copies(<138 copies/mL). A negative result must be combined with clinical observations, patient history, and epidemiological information. The expected result is Negative.  Fact Sheet for Patients:  EntrepreneurPulse.com.au  Fact Sheet for Healthcare Providers:  IncredibleEmployment.be  This test is no t yet approved or cleared by the [place] FDA and  has been authorized for detection and/or diagnosis of SARS-CoV-2 by FDA under an Emergency Use Authorization (EUA). This EUA will remain  in effect (meaning this test can be used) for the duration of the COVID-19 declaration under Section 564(b)(1) of the Act, 21 U.S.C.section 360bbb-3(b)(1), unless the authorization is terminated  or revoked sooner.       Influenza A by PCR NEGATIVE NEGATIVE Final   Influenza B by PCR NEGATIVE NEGATIVE Final    Comment: (NOTE) The Xpert Xpress SARS-CoV-2/FLU/RSV plus assay is intended as an aid in the diagnosis of influenza from Nasopharyngeal swab specimens and should not be used as a sole basis for treatment. Nasal washings and aspirates are unacceptable for Xpert Xpress SARS-CoV-2/FLU/RSV testing.  Fact Sheet for Patients: EntrepreneurPulse.com.au  Fact Sheet for Healthcare Providers: IncredibleEmployment.be  This test is not yet approved or cleared by the [place] FDA and has been authorized for detection and/or diagnosis of SARS-CoV-2 by FDA under an Emergency Use Authorization (EUA). This EUA will remain in effect (meaning this test can be used) for the duration of the COVID-19 declaration under Section 564(b)(1) of the Act, 21 U.S.C. section 360bbb-3(b)(1),  unless the authorization is terminated or revoked.  Performed at [facility], [place] [address], [place], [place] [address]          Radiology Studies: No results found.  Scheduled Meds:  (feeding supplement) PROSource Plus  30 mL Oral TID BM   atorvastatin  10 mg Oral Daily   bisacodyl  10 mg Oral Once   bisacodyl  10 mg Rectal Once   feeding supplement  1 Container Oral TID BM   heparin  5,000 Units Subcutaneous Q8H   insulin aspart  0-9 Units Subcutaneous Q4H   midodrine  15 mg Oral TID WC   mirtazapine  7.5 mg Oral QHS   multivitamin with minerals  1 tablet Oral Daily   pantoprazole  40 mg Oral Daily   polyethylene glycol  17 g Oral BID   senna-docusate  1 tablet Oral BID   sucralfate  1 g Oral TID WC & HS   Continuous Infusions:  sodium chloride 75 mL/hr at 09/10/20 2347   potassium chloride       LOS: 3 days      Kayleen Memos, MD [facility]   To contact the attending provider between 7A-7P or the covering provider during after hours 7P-7A, please log into the web site [email or web address] and access using universal [place] password for that web site. If you do not have the password, please call the hospital operator.  09/11/2020, 7:21 AM

## 2020-09-11 NOTE — TOC Progression Note (Signed)
Transition of Care [facility]) - Progression Note    Patient Details  Name: [name] [name] MRN: [id number] Date of Birth: 02/28/1955  [facility] CM/SW Contact  Sharin Mons, RN Phone Number: 09/11/2020, 3:04 PM  Clinical Narrative:    NCM faxed referral/ clinicals to [facility] for LTC/ SNF placement.   TOC team will continue to monitor and assist with TOC needs.   Expected Discharge Plan: Long Term Nursing Home Barriers to Discharge: Continued Medical Work up  Expected Discharge Plan and Services Expected Discharge Plan: [place]   Discharge Planning Services: CM Consult   Living arrangements for the past 2 months: Single Family Home                           HH Arranged: RN, PT (Active with [place]) [place]: [place] ([place]) Date [place]: 09/08/20 Time [place]: 1036 Representative spoke with at [place]: [place] ([place]) Interventions    Readmission Risk Interventions Readmission Risk Prevention Plan 02/06/2020 11/08/2019  Transportation Screening Complete Complete  Medication Review [employer]) - Referral to Pharmacy  PCP or Specialist appointment within 3-5 days of discharge Complete Complete  HRI or [place] - Complete  SW Recovery Care/Counseling Consult Complete Complete  Palliative Care Screening Not Applicable Not Applicable  Skilled Nursing Facility Complete Complete  Some recent data might be hidden

## 2020-09-11 NOTE — Progress Notes (Signed)
Nutrition Follow-up  DOCUMENTATION CODES:   Morbid obesity  INTERVENTION:   -D/c Boost Breeze po TID, each supplement provides 250 kcal and 9 grams of protein  -Continue 30 ml Prosource Plus TID with meals, each supplement provides 100 kcals and 15 grams protein -Continue MVI with minerals daily -Magic cup TID with meals, each supplement provides 290 kcal and 9 grams of protein   NUTRITION DIAGNOSIS:   Inadequate oral intake related to nausea, poor appetite as evidenced by percent weight loss, meal completion < 25%, per patient/family report.  Ongoing  GOAL:   Patient will meet greater than or equal to 90% of their needs  Progressing   MONITOR:   PO intake, Supplement acceptance, Diet advancement, Labs, Weight trends, Skin, I & O's  REASON FOR ASSESSMENT:   Consult Assessment of nutrition requirement/status  ASSESSMENT:   [name] [name] is a 65 y.o. female with medical history significant of      CKD 3, DM 2, Chronic diastolic CHF, OSA on CPAP  7/20- advanced to carb modified diet   Reviewed I/O's: +1.5 L x 24 hours and +2.1 L since admission  Spoke with pt at bedside, who reports he nausea has improved and she is feeling a bit better today. She is tolerating solid foods, consumed half a bowl of grits, fruit juice, and a piece of bacon today. She does not like Ensure or Boost Breeze ("I have a closet full of Ensure Plus and Ensure Clear at the house- I hate it all"). RD discussed importance of good meal and supplement intake to promote healing. Reviewed supplements on formulary- pt amenable to [employer].   Per MD notes, pt awaiting GI consult.  Medications reviewed and include remeron, senokot, and miralax.    Lab Results  Component Value Date   HGBA1C 6.0 (H) 09/08/2020   PTA DM medications are .   Labs reviewed: K: 3.1, CBGS: 102-136 (inpatient orders for glycemic control are 0-9 units insulin aspart every 4 hours).    Diet Order:   Diet Order              Diet Carb Modified Fluid consistency: Thin; Room service appropriate? Yes  Diet effective now                   EDUCATION NEEDS:   Education needs have been addressed  Skin:  Skin Assessment: Skin Integrity Issues: Skin Integrity Issues:: Other (Comment) Other: non-pressure wound to lt flank, dehisced abdominal incision  Last BM:  09/08/20  Height:   Ht Readings from Last 1 Encounters:  08/26/20 5\' 2"  (1.575 m)    Weight:   Wt Readings from Last 1 Encounters:  09/08/20 127.9 kg    Ideal Body Weight:  50 kg  BMI:  Body mass index is 51.57 kg/m.  Estimated Nutritional Needs:   Kcal:  1800-2000  Protein:  110-125 grams  Fluid:  > 1.8 L    Loistine Chance, RD, LDN, [place] Registered Dietitian II Certified Diabetes Care and Education Specialist Please refer to [employer] for RD and/or RD on-call/weekend/after hours pager

## 2020-09-11 NOTE — Plan of Care (Signed)

## 2020-09-12 DIAGNOSIS — N1831 Chronic kidney disease, stage 3a: Secondary | ICD-10-CM | POA: Diagnosis not present

## 2020-09-12 DIAGNOSIS — N179 Acute kidney failure, unspecified: Secondary | ICD-10-CM | POA: Diagnosis not present

## 2020-09-12 LAB — BASIC METABOLIC PANEL
Anion gap: 10 (ref 5–15)
BUN: 43 mg/dL — ABNORMAL HIGH (ref 8–23)
CO2: 25 mmol/L (ref 22–32)
Calcium: 9.5 mg/dL (ref 8.9–10.3)
Chloride: 104 mmol/L (ref 98–111)
Creatinine, Ser: 1.53 mg/dL — ABNORMAL HIGH (ref 0.44–1.00)
GFR, Estimated: 37 mL/min — ABNORMAL LOW (ref >60)
Glucose, Bld: 115 mg/dL — ABNORMAL HIGH (ref 70–99)
Potassium: 3.1 mmol/L — ABNORMAL LOW (ref 3.5–5.1)
Sodium: 139 mmol/L (ref 135–145)

## 2020-09-12 LAB — GLUCOSE, CAPILLARY
Glucose-Capillary: 103 mg/dL — ABNORMAL HIGH (ref 70–99)
Glucose-Capillary: 113 mg/dL — ABNORMAL HIGH (ref 70–99)
Glucose-Capillary: 115 mg/dL — ABNORMAL HIGH (ref 70–99)
Glucose-Capillary: 115 mg/dL — ABNORMAL HIGH (ref 70–99)
Glucose-Capillary: 133 mg/dL — ABNORMAL HIGH (ref 70–99)
Glucose-Capillary: 96 mg/dL (ref 70–99)

## 2020-09-12 LAB — PHOSPHORUS: Phosphorus: 2.7 mg/dL (ref 2.5–4.6)

## 2020-09-12 MED ORDER — POTASSIUM CHLORIDE 10 MEQ/100ML IV SOLN
10.0000 meq | Freq: Once | INTRAVENOUS | Status: AC
  Administered 2020-09-12: 10 meq via INTRAVENOUS

## 2020-09-12 MED ORDER — POTASSIUM CHLORIDE 10 MEQ/100ML IV SOLN
10.0000 meq | INTRAVENOUS | Status: AC
  Administered 2020-09-12 (×3): 10 meq via INTRAVENOUS
  Filled 2020-09-12 (×4): qty 100

## 2020-09-12 MED ORDER — POLYVINYL ALCOHOL 1.4 % OP SOLN
1.0000 [drp] | OPHTHALMIC | Status: DC | PRN
  Administered 2020-09-12 – 2020-09-21 (×10): 1 [drp] via OPHTHALMIC
  Filled 2020-09-12: qty 15

## 2020-09-12 NOTE — Progress Notes (Signed)
PROGRESS NOTE    [name] [name]  DQQ:[id number] DOB: 01-04-1956 DOA: 09/07/2020 PCP: Lavone Orn, MD    Chief Complaint  Patient presents with   Abnormal Lab    Brief Narrative:   [name] [name] is a 65 y.o. female with medical history significant of CKD 3, DM 2, Chronic diastolic CHF, OSA on CPAP, patient was sent by home health for abnormal lab, did not specify which 1 exactly, but upon presentation patient reported significant nausea, her work-up significant for decline in renal function over the last few weeks, additionally she was noted to have hypokalemia, she was admitted for further work-up.  Hospital course complicated by hypokalemia, repleted intravenously.  Noncompliance with medical management refusing to take oral medications.  Significant pruritus resolved with Benadryl and topical Sarna.  09/12/2020: Patient was seen and examined at bedside.  She reports her itchiness is much improved and almost resolved.  She denies any nausea at the time of this visit.  She has no new complaints.  Assessment & Plan:   Active Problems:   Hypokalemia   Type 2 diabetes mellitus with stage 3 chronic kidney disease (HCC)   HLD (hyperlipidemia)   Pressure injury of skin   Morbid obesity (HCC)   Chronic diastolic CHF (congestive heart failure) (HCC)   Acute renal failure superimposed on stage 3a chronic kidney disease, unspecified acute renal failure type (HCC)   OSA on CPAP   Pancreatitis   Intractable nausea and vomiting   Functional quadriplegia (HCC)  Resolved intractable nausea and vomiting At the time of this visit she denies nausea.  Tolerating a diet. IV fluids were DC'd on 09/11/2020. IV antiemetics in place as needed.  Improving AKI in CKD stage IIIB suspect prerenal in the setting of dehydration from poor oral intake. Renal function improving with IV fluid hydration. She presented with creatinine of 3.17 with GFR 15. Her creatinine has improved to 1.53 from  1.73 She has received gentle IV fluid hydration Continue to avoid nephrotoxic agents, dehydration and hypotension. Monitor urine output.  History of upper GI bleed with duodenal ulcer seen on EGD done on 11/15/2019 by Dr. Benson [place]. Switch home p.o. Protonix to IV Protonix as she has declined to take oral medications.   Continue Protonix Avoid NSAIDs  Significant, resolving, pruritus IV Benadryl as needed Sarna topical as needed  Refractory hypokalemia in the setting of medical noncompliance. She has declined oral medications. IV KCl 10 mill equivalent x4 doses. Recheck level in the morning.   Type 2 diabetes mellitus with stage 3 chronic kidney disease  Hemoglobin A1c 6.0 on 09/08/2020. Hold off home oral hypoglycemics. Insulin sliding scale in place for hyperglycemia.  Resolved elevated anion gap -in Setting of [place], and renal failure   Super morbid obesity   BMI 51. Recommend weight loss outpatient with regular physical activity and healthy dieting.   HLD (hyperlipidemia)  Continue home Lipitor.   Chronic diastolic CHF Last 2D echo done on 07/26/2020 showed LVEF 65 to 70%. Continue strict I's and O's and daily weight. Net INO +4.2 L.  Likely not accurate and has not been documented.   Functional quadriplegia (HCC) - PT/OT asessment patient continues to refuse placement   OSA  -continue CPAP   Elevated lipase  - likely due to dehydration -Currently tolerating a diet.   Pressure injury of skin, POA Left trochanter 3 x 2 x 0.6  Right pannus 3.8 x 2.5 x 0.7 -wound care consulted Continue local wound care Frequent turns  Physical  debility/medical discussion PT assessed and recommended SNF, likely long-term/custodial care placement.    DVT prophylaxis: Heparin subcu 3 times daily Code Status: Full Family Communication: D/W sister [phone].  Son [name] [name]: [phone]. Disposition:   Status is: Inpatient  The patient will require care spanning > 2  midnights and should be moved to inpatient because: IV treatments appropriate due to intensity of illness or inability to take PO  Dispo: The patient is from: Home              Anticipated d/c is to: Home with home health services.              Patient currently is not medically stable to d/c.    Difficult to place patient No       Consultants:  D/W renal by phone   Objective: Vitals:   09/11/20 0715 09/11/20 1300 09/11/20 2100 09/12/20 0700  BP: (!) 124/55 (!) 125/50 (!) 120/58 114/63  Pulse: 92 90 95 95  Resp: 16   18  Temp: 98 F (36.7 C) 98.1 F (36.7 C) 98 F (36.7 C) 98.1 F (36.7 C)  TempSrc: Oral Oral Oral Oral  SpO2: 100% 98% 97% 100%  Weight:        Intake/Output Summary (Last 24 hours) at 09/12/2020 0748 Last data filed at 09/12/2020 0100 Gross per 24 hour  Intake 2190.6 ml  Output --  Net 2190.6 ml    Filed Weights   09/08/20 1622  Weight: 127.9 kg    Examination:  General: Super morbid obesity.  No acute distress.  She is alert and oriented x4.  Respiratory: Clear to auscultation no wheezes or rales.  No wheezes or rales. Cardiac: Regular rate and rhythm no rubs or gallops.  No JVD or thyromegaly noted.  GI: Obese nondistended bowel sounds present.   Musculoskeletal: Trace edema in upper and lower extremities bilaterally.  No cyanosis or clubbing noted.   Psych: Mood is appropriate for condition and setting.   Skin: Pressure wounds noted, POA.   Data Reviewed: I have personally reviewed following labs and imaging studies  CBC: Recent Labs  Lab 09/07/20 1528 09/08/20 0208 09/08/20 0548 09/09/20 0208 09/10/20 0750  WBC 9.5 8.8  --  8.5 8.4  NEUTROABS 6.9 6.4  --   --   --   HGB 13.1 13.4 13.9 12.9 12.8  HCT 41.7 42.6 41.0 40.5 41.5  MCV 87.8 88.0  --  88.2 89.1  PLT 349 355  --  316 476    Basic Metabolic Panel: Recent Labs  Lab 09/08/20 0208 09/08/20 0548 09/08/20 1133 09/08/20 1400 09/09/20 0208 09/11/20 0338 09/12/20 0352   NA 136   < > 138 137 136 141 139  K 5.4*   < > 2.8* 3.1* 2.9* 3.1* 3.1*  CL 88*   < > 90* 86* 94* 105 104  CO2 30   < > 29 29 27 25 25   GLUCOSE 125*   < > 137* 126* 107* 126* 115*  BUN 144*   < > 140* 139* 126* 63* 43*  CREATININE 3.10*   < > 3.12* 3.13* 2.73* 1.73* 1.53*  CALCIUM 10.7*   < > 10.7* 11.0* 10.4* 9.9 9.5  MG 2.5*  --   --   --  2.4  --   --   PHOS 3.2  --   --  3.3  --  1.4* 2.7   < > = values in this interval not displayed.    GFR: Estimated  Creatinine Clearance: 45.7 mL/min (A) (by C-G formula based on SCr of 1.53 mg/dL (H)).  Liver Function Tests: Recent Labs  Lab 09/07/20 1528 09/08/20 0208 09/08/20 1400 09/11/20 0338  AST 24 39  --   --   ALT 20 22  --   --   ALKPHOS 76 72  --   --   BILITOT 1.6* 1.8*  --   --   PROT 8.2* 8.0  --   --   ALBUMIN 4.2 4.0 3.9 3.4*    CBG: Recent Labs  Lab 09/11/20 1123 09/11/20 1831 09/11/20 2027 09/12/20 0016 09/12/20 0424  GLUCAP 137* 158* 129* 96 113*     Recent Results (from the past 240 hour(s))  Resp Panel by RT-PCR (Flu A&B, Covid) Nasopharyngeal Swab     Status: None   Collection Time: 09/07/20  8:28 PM   Specimen: Nasopharyngeal Swab; Nasopharyngeal(NP) swabs in vial transport medium  Result Value Ref Range Status   SARS Coronavirus 2 by RT PCR NEGATIVE NEGATIVE Final    Comment: (NOTE) SARS-CoV-2 target nucleic acids are NOT DETECTED.  The SARS-CoV-2 RNA is generally detectable in upper respiratory specimens during the acute phase of infection. The lowest concentration of SARS-CoV-2 viral copies this assay can detect is 138 copies/mL. A negative result does not preclude SARS-Cov-2 infection and should not be used as the sole basis for treatment or other patient management decisions. A negative result may occur with  improper specimen collection/handling, submission of specimen other than nasopharyngeal swab, presence of viral mutation(s) within the areas targeted by this assay, and inadequate  number of viral copies(<138 copies/mL). A negative result must be combined with clinical observations, patient history, and epidemiological information. The expected result is Negative.  Fact Sheet for Patients:  EntrepreneurPulse.com.au  Fact Sheet for Healthcare Providers:  IncredibleEmployment.be  This test is no t yet approved or cleared by the [place] FDA and  has been authorized for detection and/or diagnosis of SARS-CoV-2 by FDA under an Emergency Use Authorization (EUA). This EUA will remain  in effect (meaning this test can be used) for the duration of the COVID-19 declaration under Section 564(b)(1) of the Act, 21 U.S.C.section 360bbb-3(b)(1), unless the authorization is terminated  or revoked sooner.       Influenza A by PCR NEGATIVE NEGATIVE Final   Influenza B by PCR NEGATIVE NEGATIVE Final    Comment: (NOTE) The Xpert Xpress SARS-CoV-2/FLU/RSV plus assay is intended as an aid in the diagnosis of influenza from Nasopharyngeal swab specimens and should not be used as a sole basis for treatment. Nasal washings and aspirates are unacceptable for Xpert Xpress SARS-CoV-2/FLU/RSV testing.  Fact Sheet for Patients: EntrepreneurPulse.com.au  Fact Sheet for Healthcare Providers: IncredibleEmployment.be  This test is not yet approved or cleared by the [place] FDA and has been authorized for detection and/or diagnosis of SARS-CoV-2 by FDA under an Emergency Use Authorization (EUA). This EUA will remain in effect (meaning this test can be used) for the duration of the COVID-19 declaration under Section 564(b)(1) of the Act, 21 U.S.C. section 360bbb-3(b)(1), unless the authorization is terminated or revoked.  Performed at [facility], [place] [address], [place], [place] [address]          Radiology Studies: No results found.      Scheduled Meds:  (feeding supplement)  PROSource Plus  30 mL Oral TID BM   atorvastatin  10 mg Oral Daily   bisacodyl  10 mg Oral Once   bisacodyl  10 mg Rectal Once   heparin  5,000 Units Subcutaneous Q8H   insulin aspart  0-9 Units Subcutaneous Q4H   midodrine  15 mg Oral TID WC   mirtazapine  7.5 mg Oral QHS   multivitamin with minerals  1 tablet Oral Daily   pantoprazole (PROTONIX) IV  40 mg Intravenous Q24H   senna-docusate  1 tablet Oral BID   sucralfate  1 g Oral TID WC & HS   Continuous Infusions:  potassium chloride       LOS: 4 days      Kayleen Memos, MD [facility]   To contact the attending provider between 7A-7P or the covering provider during after hours 7P-7A, please log into the web site [email or web address] and access using universal [place] password for that web site. If you do not have the password, please call the hospital operator.  09/12/2020, 7:48 AM

## 2020-09-12 NOTE — Plan of Care (Signed)

## 2020-09-12 NOTE — Plan of Care (Signed)

## 2020-09-13 DIAGNOSIS — N179 Acute kidney failure, unspecified: Secondary | ICD-10-CM | POA: Diagnosis not present

## 2020-09-13 DIAGNOSIS — N1831 Chronic kidney disease, stage 3a: Secondary | ICD-10-CM | POA: Diagnosis not present

## 2020-09-13 LAB — GLUCOSE, CAPILLARY
Glucose-Capillary: 108 mg/dL — ABNORMAL HIGH (ref 70–99)
Glucose-Capillary: 124 mg/dL — ABNORMAL HIGH (ref 70–99)
Glucose-Capillary: 146 mg/dL — ABNORMAL HIGH (ref 70–99)
Glucose-Capillary: 148 mg/dL — ABNORMAL HIGH (ref 70–99)
Glucose-Capillary: 92 mg/dL (ref 70–99)
Glucose-Capillary: 99 mg/dL (ref 70–99)

## 2020-09-13 LAB — BASIC METABOLIC PANEL
Anion gap: 10 (ref 5–15)
BUN: 31 mg/dL — ABNORMAL HIGH (ref 8–23)
CO2: 24 mmol/L (ref 22–32)
Calcium: 9.2 mg/dL (ref 8.9–10.3)
Chloride: 106 mmol/L (ref 98–111)
Creatinine, Ser: 1.38 mg/dL — ABNORMAL HIGH (ref 0.44–1.00)
GFR, Estimated: 42 mL/min — ABNORMAL LOW (ref >60)
Glucose, Bld: 100 mg/dL — ABNORMAL HIGH (ref 70–99)
Potassium: 3.5 mmol/L (ref 3.5–5.1)
Sodium: 140 mmol/L (ref 135–145)

## 2020-09-13 MED ORDER — INSULIN ASPART 100 UNIT/ML IJ SOLN
0.0000 [IU] | Freq: Three times a day (TID) | INTRAMUSCULAR | Status: DC
  Administered 2020-09-13 – 2020-09-14 (×2): 1 [IU] via SUBCUTANEOUS
  Administered 2020-09-14: 2 [IU] via SUBCUTANEOUS
  Administered 2020-09-15: 1 [IU] via SUBCUTANEOUS
  Administered 2020-09-15: 2 [IU] via SUBCUTANEOUS
  Administered 2020-09-16: 1 [IU] via SUBCUTANEOUS
  Administered 2020-09-16: 2 [IU] via SUBCUTANEOUS
  Administered 2020-09-17 – 2020-09-19 (×6): 1 [IU] via SUBCUTANEOUS
  Administered 2020-09-20: 2 [IU] via SUBCUTANEOUS
  Administered 2020-09-20: 1 [IU] via SUBCUTANEOUS
  Administered 2020-09-21: 2 [IU] via SUBCUTANEOUS
  Administered 2020-09-21: 1 [IU] via SUBCUTANEOUS
  Administered 2020-09-22: 2 [IU] via SUBCUTANEOUS

## 2020-09-13 MED ORDER — ENOXAPARIN SODIUM 60 MG/0.6ML IJ SOSY
60.0000 mg | PREFILLED_SYRINGE | INTRAMUSCULAR | Status: DC
  Administered 2020-09-13 – 2020-09-25 (×13): 60 mg via SUBCUTANEOUS
  Filled 2020-09-13 (×13): qty 0.6

## 2020-09-13 NOTE — Progress Notes (Signed)
PROGRESS NOTE    [name] [name]  HBZ:[id number] DOB: 10-Mar-1955 DOA: 09/07/2020 PCP: Lavone Orn, MD    Chief Complaint  Patient presents with   Abnormal Lab    Brief Narrative:   [name] [name] is a 65 y.o. female with medical history significant of CKD 3, DM 2, Chronic diastolic CHF, OSA on CPAP, patient was sent by home health for abnormal lab, did not specify which 1 exactly, but upon presentation patient reported significant nausea, her work-up significant for decline in renal function over the last few weeks, additionally she was noted to have hypokalemia, she was admitted for further work-up.  Hospital course complicated by hypokalemia, repleted intravenously.  Noncompliance with medical management refusing to take oral medications.  Significant pruritus resolved with Benadryl and topical Sarna.  09/13/2020: Patient seen and examined at her bedside.  She is concerned about going home today.  She states she receives home health services through the [place] from 9 AM -12 PM and then from 5 PM to 8 PM.  Rest of the time she is alone.  Assessment & Plan:   Active Problems:   Hypokalemia   Type 2 diabetes mellitus with stage 3 chronic kidney disease (HCC)   HLD (hyperlipidemia)   Pressure injury of skin   Morbid obesity (HCC)   Chronic diastolic CHF (congestive heart failure) (HCC)   Acute renal failure superimposed on stage 3a chronic kidney disease, unspecified acute renal failure type (HCC)   OSA on CPAP   Pancreatitis   Intractable nausea and vomiting   Functional quadriplegia (HCC)  Resolved intractable nausea and vomiting At the time of this visit she denies nausea.  Tolerating a diet. IV fluids were DC'd on 09/11/2020. IV antiemetics in place as needed.  Resolving AKI in CKD stage IIIB suspect prerenal in the setting of dehydration from poor oral intake. Renal function improving with IV fluid hydration. She presented with creatinine of 3.17 with GFR 15. Her  creatinine has improved to 1.53 from 1.73 She has received gentle IV fluid hydration Continue to avoid nephrotoxic agents, dehydration and hypotension. Monitor urine output.  History of upper GI bleed with duodenal ulcer seen on EGD done on 11/15/2019 by Dr. Benson [place]. Switch home p.o. Protonix to IV Protonix as she has declined to take oral medications.   Continue Protonix Avoid NSAIDs  Significant, resolving, pruritus IV Benadryl as needed Sarna topical as needed  Resolved refractory hypokalemia in the setting of medical noncompliance. She has declined oral medications. IV KCl 10 mill equivalent x4 doses. Serum potassium 3.5.   Type 2 diabetes mellitus with stage 3 chronic kidney disease  Hemoglobin A1c 6.0 on 09/08/2020. Hold off home oral hypoglycemics. Insulin sliding scale in place for hyperglycemia.  Resolved elevated anion gap -in Setting of [place]ard[place], and renal failure   Super morbid obesity   BMI 51. Recommend weight loss outpatient with regular physical activity and healthy dieting.   HLD (hyperlipidemia)  Continue home Lipitor.   Chronic diastolic CHF Last 2D echo done on 07/26/2020 showed LVEF 65 to 70%. Continue strict I's and O's and daily weight. Net INO +4.2 L.  Likely not accurate and has not been documented.   Functional quadriplegia (HCC) - PT/OT asessment patient continues to refuse placement   OSA  -continue CPAP   Elevated lipase  - likely due to dehydration -Currently tolerating a diet.   Pressure injury of skin, POA Left trochanter 3 x 2 x 0.6  Right pannus 3.8 x 2.5 x 0.7 -  wound care consulted Continue local wound care Frequent turns  Physical debility/medical discussion PT assessed and recommended SNF, likely long-term/custodial care placement.    DVT prophylaxis: Heparin subcu 3 times daily Code Status: Full Family Communication: D/W sister [phone].  Son [name] [name]: [phone]. Disposition:   Status is: Inpatient  The  patient will require care spanning > 2 midnights and should be moved to inpatient because: IV treatments appropriate due to intensity of illness or inability to take PO  Dispo: The patient is from: Home              Anticipated d/c is to: Home with home health services.              Patient currently is not medically stable to d/c.    Difficult to place patient No       Consultants:  D/W renal by phone   Objective: Vitals:   09/12/20 1500 09/12/20 2125 09/13/20 0751 09/13/20 1550  BP: 127/63 (!) 103/54 (!) 99/57 (!) 133/58  Pulse: (!) 106 96 83 (!) 102  Resp: 17 18  16   Temp: 98.6 F (37 C) 97.9 F (36.6 C) 97.7 F (36.5 C) 98.5 F (36.9 C)  TempSrc: Oral Oral Oral Oral  SpO2: 98% 98% 100% 99%  Weight:        Intake/Output Summary (Last 24 hours) at 09/13/2020 1824 Last data filed at 09/13/2020 1703 Gross per 24 hour  Intake 320 ml  Output 1050 ml  Net -730 ml    Filed Weights   09/08/20 1622  Weight: 127.9 kg    Examination: No changes.  General: Super morbid obesity.  No acute distress.  She is alert and oriented x4.  Respiratory: Clear to auscultation no wheezes or rales.  No wheezes or rales. Cardiac: Regular rate and rhythm no rubs or gallops.  No JVD or thyromegaly noted.  GI: Obese nondistended bowel sounds present.   Musculoskeletal: Trace edema in upper and lower extremities bilaterally.  No cyanosis or clubbing noted.   Psych: Mood is appropriate for condition and setting.   Skin: Pressure wounds noted, POA.   Data Reviewed: I have personally reviewed following labs and imaging studies  CBC: Recent Labs  Lab 09/07/20 1528 09/08/20 0208 09/08/20 0548 09/09/20 0208 09/10/20 0750  WBC 9.5 8.8  --  8.5 8.4  NEUTROABS 6.9 6.4  --   --   --   HGB 13.1 13.4 13.9 12.9 12.8  HCT 41.7 42.6 41.0 40.5 41.5  MCV 87.8 88.0  --  88.2 89.1  PLT 349 355  --  316 976    Basic Metabolic Panel: Recent Labs  Lab 09/08/20 0208 09/08/20 0548 09/08/20 1400  09/09/20 0208 09/11/20 0338 09/12/20 0352 09/13/20 0549  NA 136   < > 137 136 141 139 140  K 5.4*   < > 3.1* 2.9* 3.1* 3.1* 3.5  CL 88*   < > 86* 94* 105 104 106  CO2 30   < > 29 27 25 25 24   GLUCOSE 125*   < > 126* 107* 126* 115* 100*  BUN 144*   < > 139* 126* 63* 43* 31*  CREATININE 3.10*   < > 3.13* 2.73* 1.73* 1.53* 1.38*  CALCIUM 10.7*   < > 11.0* 10.4* 9.9 9.5 9.2  MG 2.5*  --   --  2.4  --   --   --   PHOS 3.2  --  3.3  --  1.4* 2.7  --    < > =  values in this interval not displayed.    GFR: Estimated Creatinine Clearance: 50.7 mL/min (A) (by C-G formula based on SCr of 1.38 mg/dL (H)).  Liver Function Tests: Recent Labs  Lab 09/07/20 1528 09/08/20 0208 09/08/20 1400 09/11/20 0338  AST 24 39  --   --   ALT 20 22  --   --   ALKPHOS 76 72  --   --   BILITOT 1.6* 1.8*  --   --   PROT 8.2* 8.0  --   --   ALBUMIN 4.2 4.0 3.9 3.4*    CBG: Recent Labs  Lab 09/13/20 0015 09/13/20 0350 09/13/20 0753 09/13/20 1114 09/13/20 1552  GLUCAP 124* 99 92 108* 148*     Recent Results (from the past 240 hour(s))  Resp Panel by RT-PCR (Flu A&B, Covid) Nasopharyngeal Swab     Status: None   Collection Time: 09/07/20  8:28 PM   Specimen: Nasopharyngeal Swab; Nasopharyngeal(NP) swabs in vial transport medium  Result Value Ref Range Status   SARS Coronavirus 2 by RT PCR NEGATIVE NEGATIVE Final    Comment: (NOTE) SARS-CoV-2 target nucleic acids are NOT DETECTED.  The SARS-CoV-2 RNA is generally detectable in upper respiratory specimens during the acute phase of infection. The lowest concentration of SARS-CoV-2 viral copies this assay can detect is 138 copies/mL. A negative result does not preclude SARS-Cov-2 infection and should not be used as the sole basis for treatment or other patient management decisions. A negative result may occur with  improper specimen collection/handling, submission of specimen other than nasopharyngeal swab, presence of viral mutation(s) within  the areas targeted by this assay, and inadequate number of viral copies(<138 copies/mL). A negative result must be combined with clinical observations, patient history, and epidemiological information. The expected result is Negative.  Fact Sheet for Patients:  EntrepreneurPulse.com.au  Fact Sheet for Healthcare Providers:  IncredibleEmployment.be  This test is no t yet approved or cleared by the [place] FDA and  has been authorized for detection and/or diagnosis of SARS-CoV-2 by FDA under an Emergency Use Authorization (EUA). This EUA will remain  in effect (meaning this test can be used) for the duration of the COVID-19 declaration under Section 564(b)(1) of the Act, 21 U.S.C.section 360bbb-3(b)(1), unless the authorization is terminated  or revoked sooner.       Influenza A by PCR NEGATIVE NEGATIVE Final   Influenza B by PCR NEGATIVE NEGATIVE Final    Comment: (NOTE) The Xpert Xpress SARS-CoV-2/FLU/RSV plus assay is intended as an aid in the diagnosis of influenza from Nasopharyngeal swab specimens and should not be used as a sole basis for treatment. Nasal washings and aspirates are unacceptable for Xpert Xpress SARS-CoV-2/FLU/RSV testing.  Fact Sheet for Patients: EntrepreneurPulse.com.au  Fact Sheet for Healthcare Providers: IncredibleEmployment.be  This test is not yet approved or cleared by the [place] FDA and has been authorized for detection and/or diagnosis of SARS-CoV-2 by FDA under an Emergency Use Authorization (EUA). This EUA will remain in effect (meaning this test can be used) for the duration of the COVID-19 declaration under Section 564(b)(1) of the Act, 21 U.S.C. section 360bbb-3(b)(1), unless the authorization is terminated or revoked.  Performed at [facility], [place] [address], [place], [place] [address]          Radiology Studies: No results  found.      Scheduled Meds:  (feeding supplement) PROSource Plus  30 mL Oral TID BM   atorvastatin  10 mg Oral Daily  enoxaparin (LOVENOX) injection  60 mg Subcutaneous Q24H   insulin aspart  0-9 Units Subcutaneous TID WC   midodrine  15 mg Oral TID WC   mirtazapine  7.5 mg Oral QHS   multivitamin with minerals  1 tablet Oral Daily   pantoprazole (PROTONIX) IV  40 mg Intravenous Q24H   senna-docusate  1 tablet Oral BID   sucralfate  1 g Oral TID WC & HS   Continuous Infusions:     LOS: 5 days      Kayleen Memos, MD [facility]   To contact the attending provider between 7A-7P or the covering provider during after hours 7P-7A, please log into the web site [email or web address] and access using universal [place] password for that web site. If you do not have the password, please call the hospital operator.  09/13/2020, 6:24 PM

## 2020-09-13 NOTE — Plan of Care (Signed)

## 2020-09-13 NOTE — Plan of Care (Signed)

## 2020-09-14 DIAGNOSIS — I5032 Chronic diastolic (congestive) heart failure: Secondary | ICD-10-CM | POA: Diagnosis not present

## 2020-09-14 LAB — GLUCOSE, CAPILLARY
Glucose-Capillary: 112 mg/dL — ABNORMAL HIGH (ref 70–99)
Glucose-Capillary: 126 mg/dL — ABNORMAL HIGH (ref 70–99)
Glucose-Capillary: 134 mg/dL — ABNORMAL HIGH (ref 70–99)
Glucose-Capillary: 149 mg/dL — ABNORMAL HIGH (ref 70–99)
Glucose-Capillary: 155 mg/dL — ABNORMAL HIGH (ref 70–99)
Glucose-Capillary: 99 mg/dL (ref 70–99)

## 2020-09-14 LAB — BASIC METABOLIC PANEL
Anion gap: 9 (ref 5–15)
BUN: 28 mg/dL — ABNORMAL HIGH (ref 8–23)
CO2: 26 mmol/L (ref 22–32)
Calcium: 9.2 mg/dL (ref 8.9–10.3)
Chloride: 105 mmol/L (ref 98–111)
Creatinine, Ser: 1.57 mg/dL — ABNORMAL HIGH (ref 0.44–1.00)
GFR, Estimated: 36 mL/min — ABNORMAL LOW (ref >60)
Glucose, Bld: 119 mg/dL — ABNORMAL HIGH (ref 70–99)
Potassium: 3.9 mmol/L (ref 3.5–5.1)
Sodium: 140 mmol/L (ref 135–145)

## 2020-09-14 MED ORDER — DIPHENHYDRAMINE HCL 50 MG/ML IJ SOLN
12.5000 mg | Freq: Once | INTRAMUSCULAR | Status: AC
  Administered 2020-09-14: 12.5 mg via INTRAVENOUS
  Filled 2020-09-14: qty 1

## 2020-09-14 NOTE — Progress Notes (Signed)
[place] Rm [id number] Manufacturing engineer [facility]) Hospital Liaison note:  This is a pending outpatient-based Palliative Care patient. Will continue to follow for disposition.  Please call with any outpatient palliative questions or concerns.  Thank you, Lorelee Market, LPN [facility] Liaison [phone]

## 2020-09-14 NOTE — Progress Notes (Signed)
PROGRESS NOTE    [name] [name]  AYT:[id number] DOB: [birth year] DOA: 09/07/2020 PCP: Lavone Orn, MD   Chief Complaint  Patient presents with   Abnormal Lab  Brief Narrative: 65 year old female with history of CKD stage III, type 2 diabetes chronic diastolic CHF, OSA on CPAP admitted with melena by home health, upon presentation in the ED had nausea and showed AKI -patient was admitted, managed with IV fluid hydration.  Symptomatic management.  She had intractable nausea vomiting that resolved, renal function more or less stable.  Found to be very weak 2 ASSIST and since not much help at home .  Subjective: Alert, awake, some nausea, no vomiting no abd pain n odiarrhea having her meal ? I do not have bed at home, my sister working on getting me to [place], I have no heap at home except aid" needing 2 mins assist. Normally cannot walk as she broke her left leg June 2021  Assessment & Plan:  Intractable nausea and vomiting Resolved.  Off IV fluids.  Tolerating diet.  AKI on CKD stage IIIb Suspect prerenal due to dehydration with poor oral intake.  Creatinine improved at 1.5 g on admission 2.7.  Encourage oral hydration.uop stable. Recent Labs  Lab 09/09/20 0208 09/11/20 0338 09/12/20 0352 09/13/20 0549 09/14/20 0129  BUN 126* 63* 43* 31* 28*  CREATININE 2.73* 1.73* 1.53* 1.38* 1.57*   Intake/Output Summary (Last 24 hours) at 09/14/2020 1119 Last data filed at 09/14/2020 0900 Gross per 24 hour  Intake 120 ml  Output 1150 ml  Net -1030 ml   Hypokalemia: Resolved Recent Labs  Lab 09/09/20 0208 09/11/20 0338 09/12/20 0352 09/13/20 0549 09/14/20 0129  K 2.9* 3.1* 3.1* 3.5 3.9    History of upper GI bleeding duodenal ulcer on last endoscopy 11/15/2019 Continue PPI avoid NSAIDs  Pruritus: Benadryl as needed  Type 2 diabetes mellitus with stage 3 chronic kidney disease : Hba1c 6.0 on 7/19, blood sugar stable. Cont ssi Recent Labs  Lab 09/13/20 2007 09/14/20 0027  09/14/20 0400 09/14/20 0811 09/14/20 1108  GLUCAP 146* 126* 112* 99 155*    HLD cont lipitor  Morbid obesity with BMI 51 Will benefit with weight loss, healthy lifestyle  Chronic diastolic CHF EF 35-57%, compensated.  Filed Weights   09/08/20 1622  Weight: 127.9 kg   OSA on CPAP-cont same  Functional quadriplegia Deconditioning/debility  continue PT OT  Increased lipase likely from dehydration no abdominal pain  Diet Order             Diet Carb Modified Fluid consistency: Thin; Room service appropriate? Yes  Diet effective now                   Nutrition Problem: Inadequate oral intake Etiology: nausea, poor appetite Signs/Symptoms: percent weight loss, meal completion < 25%, per patient/family report Percent weight loss: 12.2 % Interventions: Boost Breeze, MVI, Prostat Patient's Body mass index is 51.57 kg/m.  DVT prophylaxis: lovenox Code Status:   Code Status: Full Code  Family Communication: plan of care discussed with patient at bedside. Status is: Inpatient  Remains inpatient appropriate because:Unsafe d/c plan  Dispo: Patient From: Home  Planned Disposition: Bas[facility]  Medically stable for discharge: No   Unresulted Labs (From admission, onward)    None       Medications reviewed:  Scheduled Meds:  (feeding supplement) PROSource Plus  30 mL Oral TID BM   atorvastatin  10 mg Oral Daily   enoxaparin (LOVENOX) injection  60 mg Subcutaneous Q24H   insulin aspart  0-9 Units Subcutaneous TID WC   midodrine  15 mg Oral TID WC   mirtazapine  7.5 mg Oral QHS   multivitamin with minerals  1 tablet Oral Daily   pantoprazole (PROTONIX) IV  40 mg Intravenous Q24H   senna-docusate  1 tablet Oral BID   sucralfate  1 g Oral TID WC & HS   Continuous Infusions: Consultants:see note  Procedures:see note Antimicrobials: Anti-infectives (From admission, onward)    None      Culture/Microbiology    Component Value Date/Time    SDES  07/27/2020 1640    URINE, RANDOM Performed at [facility], [place] [address], [place], [place] [address]    SPECREQUEST  07/27/2020 1640    NONE Performed at [facility], [place] [address] [place], [place] [address]    CULT 50,000 COLONIES/mL KLEBSIELLA PNEUMONIAE (A) 07/27/2020 1640   REPTSTATUS 07/30/2020 FINAL 07/27/2020 1640    Other culture-see note  Objective: Vitals: Today's Vitals   09/13/20 1703 09/13/20 2004 09/14/20 0754 09/14/20 0800  BP:  (!) 105/56 (!) 121/53   Pulse:  96 76   Resp:  18 16   Temp:  98.2 F (36.8 C) 97.9 F (36.6 C)   TempSrc:  Oral Oral   SpO2:  100% 100%   Weight:      PainSc: 0-No pain 0-No pain  Asleep    Intake/Output Summary (Last 24 hours) at 09/14/2020 0827 Last data filed at 09/14/2020 0557 Gross per 24 hour  Intake 200 ml  Output 1150 ml  Net -950 ml   Filed Weights   09/08/20 1622  Weight: 127.9 kg   Weight change:   Intake/Output from previous day: 07/24 0701 - 07/25 0700 In: 200 [P.O.:200] Out: 1150 [Urine:1150] Intake/Output this shift: No intake/output data recorded. Filed Weights   09/08/20 1622  Weight: 127.9 kg   Examination: General exam: AAOx3, obese, o nRA HEENT:Oral mucosa moist, Ear/Nose WNL grossly,dentition normal. Respiratory system: bilaterally diminished,no use of accessory muscle, non tender. Cardiovascular system: S1 & S2 +,No JVD. Gastrointestinal system: Abdomen soft, NT,ND, BS+. Nervous System:Alert, awake, moving extremities Extremities: No edema, distal peripheral pulses palpable.  Skin: No rashes,no icterus. MSK: Normal muscle bulk,tone, power Data Reviewed: I have personally reviewed following labs and imaging studies CBC: Recent Labs  Lab 09/07/20 1528 09/08/20 0208 09/08/20 0548 09/09/20 0208 09/10/20 0750  WBC 9.5 8.8  --  8.5 8.4  NEUTROABS 6.9 6.4  --   --   --   HGB 13.1 13.4 13.9 12.9 12.8  HCT 41.7 42.6 41.0 40.5 41.5  MCV 87.8  88.0  --  88.2 89.1  PLT 349 355  --  316 295   Basic Metabolic Panel: Recent Labs  Lab 09/08/20 0208 09/08/20 0548 09/08/20 1400 09/09/20 0208 09/11/20 0338 09/12/20 0352 09/13/20 0549 09/14/20 0129  NA 136   < > 137 136 141 139 140 140  K 5.4*   < > 3.1* 2.9* 3.1* 3.1* 3.5 3.9  CL 88*   < > 86* 94* 105 104 106 105  CO2 30   < > 29 27 25 25 24 26   GLUCOSE 125*   < > 126* 107* 126* 115* 100* 119*  BUN 144*   < > 139* 126* 63* 43* 31* 28*  CREATININE 3.10*   < > 3.13* 2.73* 1.73* 1.53* 1.38* 1.57*  CALCIUM 10.7*   < > 11.0* 10.4* 9.9 9.5 9.2 9.2  MG 2.5*  --   --  2.4  --   --   --   --   PHOS 3.2  --  3.3  --  1.4* 2.7  --   --    < > = values in this interval not displayed.   GFR: Estimated Creatinine Clearance: 44.6 mL/min (A) (by C-G formula based on SCr of 1.57 mg/dL (H)). Liver Function Tests: Recent Labs  Lab 09/07/20 1528 09/08/20 0208 09/08/20 1400 09/11/20 0338  AST 24 39  --   --   ALT 20 22  --   --   ALKPHOS 76 72  --   --   BILITOT 1.6* 1.8*  --   --   PROT 8.2* 8.0  --   --   ALBUMIN 4.2 4.0 3.9 3.4*   Recent Labs  Lab 09/07/20 1528 09/08/20 0208  LIPASE 94* 98*   No results for input(s): AMMONIA in the last 168 hours. Coagulation Profile: No results for input(s): INR, PROTIME in the last 168 hours. Cardiac Enzymes: Recent Labs  Lab 09/08/20 0208  CKTOTAL 76   BNP (last 3 results) No results for input(s): PROBNP in the last 8760 hours. HbA1C: No results for input(s): HGBA1C in the last 72 hours. CBG: Recent Labs  Lab 09/13/20 1552 09/13/20 2007 09/14/20 0027 09/14/20 0400 09/14/20 0811  GLUCAP 148* 146* 126* 112* 99   Lipid Profile: No results for input(s): CHOL, HDL, LDLCALC, TRIG, CHOLHDL, LDLDIRECT in the last 72 hours. Thyroid Function Tests: No results for input(s): TSH, T4TOTAL, FREET4, T3FREE, THYROIDAB in the last 72 hours. Anemia Panel: No results for input(s): VITAMINB12, FOLATE, FERRITIN, TIBC, IRON, RETICCTPCT in  the last 72 hours. Sepsis Labs: Recent Labs  Lab 09/08/20 0302  LATICACIDVEN 1.5    Recent Results (from the past 240 hour(s))  Resp Panel by RT-PCR (Flu A&B, Covid) Nasopharyngeal Swab     Status: None   Collection Time: 09/07/20  8:28 PM   Specimen: Nasopharyngeal Swab; Nasopharyngeal(NP) swabs in vial transport medium  Result Value Ref Range Status   SARS Coronavirus 2 by RT PCR NEGATIVE NEGATIVE Final    Comment: (NOTE) SARS-CoV-2 target nucleic acids are NOT DETECTED.  The SARS-CoV-2 RNA is generally detectable in upper respiratory specimens during the acute phase of infection. The lowest concentration of SARS-CoV-2 viral copies this assay can detect is 138 copies/mL. A negative result does not preclude SARS-Cov-2 infection and should not be used as the sole basis for treatment or other patient management decisions. A negative result may occur with  improper specimen collection/handling, submission of specimen other than nasopharyngeal swab, presence of viral mutation(s) within the areas targeted by this assay, and inadequate number of viral copies(<138 copies/mL). A negative result must be combined with clinical observations, patient history, and epidemiological information. The expected result is Negative.  Fact Sheet for Patients:  EntrepreneurPulse.com.au  Fact Sheet for Healthcare Providers:  IncredibleEmployment.be  This test is no t yet approved or cleared by the [place] FDA and  has been authorized for detection and/or diagnosis of SARS-CoV-2 by FDA under an Emergency Use Authorization (EUA). This EUA will remain  in effect (meaning this test can be used) for the duration of the COVID-19 declaration under Section 564(b)(1) of the Act, 21 U.S.C.section 360bbb-3(b)(1), unless the authorization is terminated  or revoked sooner.       Influenza A by PCR NEGATIVE NEGATIVE Final   Influenza B by PCR NEGATIVE NEGATIVE Final  Comment: (NOTE) The Xpert Xpress SARS-CoV-2/FLU/RSV plus assay is intended as an aid in the diagnosis of influenza from Nasopharyngeal swab specimens and should not be used as a sole basis for treatment. Nasal washings and aspirates are unacceptable for Xpert Xpress SARS-CoV-2/FLU/RSV testing.  Fact Sheet for Patients: EntrepreneurPulse.com.au  Fact Sheet for Healthcare Providers: IncredibleEmployment.be  This test is not yet approved or cleared by the [place] FDA and has been authorized for detection and/or diagnosis of SARS-CoV-2 by FDA under an Emergency Use Authorization (EUA). This EUA will remain in effect (meaning this test can be used) for the duration of the COVID-19 declaration under Section 564(b)(1) of the Act, 21 U.S.C. section 360bbb-3(b)(1), unless the authorization is terminated or revoked.  Performed at [facility], [place] [address], [place], [place] [address]      Radiology Studies: No results found.   LOS: 6 days   Antonieta Pert, MD [facility]  09/14/2020, 8:27 AM

## 2020-09-14 NOTE — TOC Progression Note (Signed)
Transition of Care [facility]) - Progression Note    Patient Details  Name: [name] [name] MRN: [id number] Date of Birth: 1955/10/04  Transition of Care [facility]) CM/SW Contact  Sharin Mons, RN Phone Number: 09/14/2020, 8:56 AM  Clinical Narrative:    NCM resent clinicals with updated notes to [facility] requesting LTC/SNF . Information faxed to [phone]. Questions: [phone] Alicia Driver [id number].  TOC team will continue to monitor and assist with TOC  needs....      Expected Discharge Plan: Long Term Nursing Home Barriers to Discharge: Continued Medical Work up  Expected Discharge Plan and Services Expected Discharge Plan: [place]   Discharge Planning Services: CM Consult   Living arrangements for the past 2 months: Single Family Home                           HH Arranged: RN, PT (Active with [place]) [place]: [place] ([place]) Date [place]: 09/08/20 Time [place]: 1036 Representative spoke with at [place]: [place] ([place]) Interventions    Readmission Risk Interventions Readmission Risk Prevention Plan 02/06/2020 11/08/2019  Transportation Screening Complete Complete  Medication Review Press photographer) - Referral to Pharmacy  PCP or Specialist appointment within 3-5 days of discharge Complete Complete  HRI or [place] - Complete  SW Recovery Care/Counseling Consult Complete Complete  Palliative Care Screening Not Applicable Not Applicable  Skilled Nursing Facility Complete Complete  Some recent data might be hidden

## 2020-09-14 NOTE — Progress Notes (Signed)
Physical Therapy Treatment Patient Details Name: [name] [name] MRN: [id number] DOB: 1955-05-06 Today's Date: 09/14/2020    History of Present Illness 65 y.o. female presents to [facility] ED on 08/25/2020 with reports of generalized weakness and loss of apetite. Pt recently admitted one month ago with CHF exacerbation, was recommended to discharge to SNF but refused and discharged home. Pt admitted for management of AKI, CHF, sacral ulcers, and acute on chronic debility. PMH includes type 2 diabetes mellitus, chronic diastolic CHF, OSA, depression, anxiety, chronic kidney disease stage III.    PT Comments    Pt agreeable to PT session but at times argumentative and self limiting. Focus of session for OOB to chair per conversation with MD.  Pt is in recliner but will sign off as nursing present to witness to technique and positioning.  Pt with complaints of itching and burning from her feet.  She also requested her eye drops.  Informed nursing of patient needs.  Encouraged patient to sit x 1 hour and NT and RN aware to use lift to move patient back to bed.  Will sign off on patient at this time as she lacks rehab potential and no longer requires skilled PT intervention until sitting tolerance with mechanical lift improves.    Of note informed MD to add order for OOB to chair with lift for nursing staff if he wished to continue OOB to recliner.   Physical Therapy Discharge Patient Details Name: [name] [name] MRN: [id number] DOB: [birth year] Today's Date: 09/14/2020 Time: 1310-1340 PT Time Calculation (min) (ACUTE ONLY): 30 min  Patient discharged from PT services secondary to patient has made no progress toward goals in a reasonable time frame.  Please see latest therapy progress note for current level of functioning and progress toward goals.    Progress and discharge plan discussed with patient and/or caregiver: Patient/Caregiver agrees with plan  GP       Jakwan Sally Eli Hose 09/14/2020, 2:41  PM   Follow Up Recommendations  SNF (for LTC)     Equipment Recommendations       Recommendations for Other Services       Precautions / Restrictions Precautions Precautions: Fall;Other (comment) Precaution Comments: Skin integrity and contractures Restrictions Weight Bearing Restrictions: No    Mobility  Bed Mobility Overal bed mobility: Needs Assistance Bed Mobility: Rolling Rolling: Max assist;+2 for physical assistance         General bed mobility comments: Continued limited effort; less holding self in sidelying with rail with roll to L; PT assisted with bed mobility for hygeine, and to help with cleaning up (soiled in loose BM) also to place new pad and lift pad.    Transfers Overall transfer level: Needs assistance Equipment used:  (maximove mechanical lift)             General transfer comment: Total +2 to move OOB to recliner chair with maximove lift.  Pt unable to flex knees to approach chair from front so transferred into recliner with approach from side with lift.  Ambulation/Gait                 Stairs             Wheelchair Mobility    Modified Rankin (Stroke Patients Only)       Balance Overall balance assessment:  (unable to assess as all sitting was passive and had support from back of recliner.)  Cognition Arousal/Alertness: Awake/alert Behavior During Therapy: WFL for tasks assessed/performed;Agitated Overall Cognitive Status: Within Functional Limits for tasks assessed                                 General Comments: Pt argumenative at times and not recepetive to encouragement.  PTA asked numerous time if she wished to be left in bed and patient declined.  NT presents during session and witnessed patient behavior.  PTA reassured her if at any point she did not feel comfortable to let PTA know.  Pt never reported to stop session.      Exercises       General Comments        Pertinent Vitals/Pain Pain Assessment: Faces Faces Pain Scale: Hurts even more Pain Location: wounds in skin folds, R vaginal labum, pain in feet to touch, R shoulder Pain Descriptors / Indicators: Grimacing;Tender;Sharp Pain Intervention(s): Monitored during session;Repositioned    Home Living                      Prior Function            PT Goals (current goals can now be found in the care plan section) Acute Rehab PT Goals Patient Stated Goal: not stated. Potential to Achieve Goals: Poor Progress towards PT goals: Not progressing toward goals - comment (self limiting behavior and pain limits progress.)    Frequency     (d/c from caseload.)      PT Plan Discharge plan needs to be updated;Frequency needs to be updated    Co-evaluation              AM-PAC PT "6 Clicks" Mobility   Outcome Measure  Help needed turning from your back to your side while in a flat bed without using bedrails?: Total Help needed moving from lying on your back to sitting on the side of a flat bed without using bedrails?: Total Help needed moving to and from a bed to a chair (including a wheelchair)?: Total Help needed standing up from a chair using your arms (e.g., wheelchair or bedside chair)?: Total Help needed to walk in hospital room?: Total Help needed climbing 3-5 steps with a railing? : Total 6 Click Score: 6    End of Session Equipment Utilized During Treatment:  (maximove mechanical lift and XL maximove sling pad.)   Patient left: in chair;with call bell/phone within reach;with chair alarm set Nurse Communication: Mobility status;Need for lift equipment (informed nursing and NT to allow pt to sit up x 1 hour to improve OOB activity tolerance that MD was requesting.) PT Visit Diagnosis: Other abnormalities of gait and mobility (R26.89);Pain Pain - Right/Left:  (multiple wounds)     Time: 1310-1340 PT Time Calculation (min) (ACUTE  ONLY): 30 min  Charges:  $Therapeutic Activity: 23-37 mins                     Erasmo Leventhal , PTA Acute Rehabilitation Services Pager [phone] Office [phone]    Farhiya Rosten Eli Hose 09/14/2020, 2:38 PM

## 2020-09-15 DIAGNOSIS — E785 Hyperlipidemia, unspecified: Secondary | ICD-10-CM

## 2020-09-15 LAB — GLUCOSE, CAPILLARY
Glucose-Capillary: 102 mg/dL — ABNORMAL HIGH (ref 70–99)
Glucose-Capillary: 159 mg/dL — ABNORMAL HIGH (ref 70–99)
Glucose-Capillary: 149 mg/dL — ABNORMAL HIGH (ref 70–99)
Glucose-Capillary: 138 mg/dL — ABNORMAL HIGH (ref 70–99)

## 2020-09-15 MED ORDER — HYDROXYZINE HCL 10 MG PO TABS
10.0000 mg | ORAL_TABLET | Freq: Three times a day (TID) | ORAL | Status: DC | PRN
  Administered 2020-09-15: 10 mg via ORAL
  Filled 2020-09-15 (×2): qty 1

## 2020-09-15 MED ORDER — TOBRAMYCIN 0.3 % OP SOLN
2.0000 [drp] | Freq: Four times a day (QID) | OPHTHALMIC | Status: DC
  Administered 2020-09-15 – 2020-09-16 (×4): 2 [drp] via OPHTHALMIC
  Filled 2020-09-15: qty 5

## 2020-09-15 NOTE — Progress Notes (Signed)
PROGRESS NOTE    [name] [name]  PXT:[id number] DOB: [birth year] DOA: 09/07/2020 PCP: Lavone Orn, MD   Chief Complaint  Patient presents with   Abnormal Lab  Brief Narrative: 65 year old female with history of CKD stage III, type 2 diabetes chronic diastolic CHF, OSA on CPAP admitted with melena by home health, upon presentation in the ED had nausea and showed AKI -patient was admitted, managed with IV fluid hydration.  Symptomatic management.  She had intractable nausea vomiting that resolved, renal function more or less stable.  Found to be very weak 2 ASSIST and since not much help at home .  Subjective: Seen and examined this morning.  Complains of "nerves" asking some medication requesting Ativan yesterday.  Alert awake. " My sister does not want me to return home"  Assessment & Plan:  Intractable nausea and vomiting:Resolved.  Off IV fluids.  Tolerating diet.  AKI on CKD stage IIIb:Suspect prerenal due to dehydration with poor oral intake.  Creatinine improved at 1.5, on admission 2.7.  Encourage oral hydration.   Recent Labs  Lab 09/09/20 0208 09/11/20 0338 09/12/20 0352 09/13/20 0549 09/14/20 0129  BUN 126* 63* 43* 31* 28*  CREATININE 2.73* 1.73* 1.53* 1.38* 1.57*   No intake or output data in the 24 hours ending 09/15/20 1021  Hypokalemia: Resolved Recent Labs  Lab 09/09/20 0208 09/11/20 0338 09/12/20 0352 09/13/20 0549 09/14/20 0129  K 2.9* 3.1* 3.1* 3.5 3.9   History of upper GI bleeding duodenal ulcer on last endoscopy 11/15/2019:Continue PPI avoid NSAIDs  Pruritus: Benadryl as needed.  Type 2 diabetes mellitus with stage 3 chronic kidney disease :Hba1c 6.0 on 7/19,blood sugar well controlled.Cont ssi Recent Labs  Lab 09/14/20 0811 09/14/20 1108 09/14/20 1712 09/14/20 2019 09/15/20 0639  GLUCAP 99 155* 134* 149* 102*     HLD: on lipitor  Morbid obesity with BMI 51:Will benefit with weight loss, healthy lifestyle  Chronic diastolic CHF:EF  62-70%, compensated.  Filed Weights   09/08/20 1622  Weight: 127.9 kg   OSA on CPAP-cont same  Functional quadriplegia Deconditioning/debility  continue PT OT  Increased lipase likely from dehydration no abdominal pain  Anxiety issues: Add Atarax as needed  Complains of eye soreness added eyedrop  Diet Order             Diet Carb Modified Fluid consistency: Thin; Room service appropriate? Yes  Diet effective now                   Nutrition Problem: Inadequate oral intake Etiology: nausea, poor appetite Signs/Symptoms: percent weight loss, meal completion < 25%, per patient/family report Percent weight loss: 12.2 % Interventions: Boost Breeze, MVI, Prostat Patient's Body mass index is 51.57 kg/m.  DVT prophylaxis: lovenox Code Status:   Code Status: Full Code  Family Communication: plan of care discussed with patient at bedside. Status is: Inpatient  Remains inpatient appropriate because:Unsafe d/c plan  Dispo: Patient From: Home  Planned Disposition: [place]  Medically stable for discharge: No   Unresulted Labs (From admission, onward)    None       Medications reviewed:  Scheduled Meds:  (feeding supplement) PROSource Plus  30 mL Oral TID BM   atorvastatin  10 mg Oral Daily   enoxaparin (LOVENOX) injection  60 mg Subcutaneous Q24H   insulin aspart  0-9 Units Subcutaneous TID WC   midodrine  15 mg Oral TID WC   mirtazapine  7.5 mg Oral QHS   multivitamin with minerals  1 tablet Oral Daily   pantoprazole (PROTONIX) IV  40 mg Intravenous Q24H   senna-docusate  1 tablet Oral BID   sucralfate  1 g Oral TID WC & HS   Continuous Infusions: Consultants:see note  Procedures:see note Antimicrobials: Anti-infectives (From admission, onward)    None      Culture/Microbiology    Component Value Date/Time   SDES  07/27/2020 1640    URINE, RANDOM Performed at [facility], [place] [address], [place], [place]  [address]    SPECREQUEST  07/27/2020 1640    NONE Performed at [facility], [place] [address] [place], [place] [address]    CULT 50,000 COLONIES/mL KLEBSIELLA PNEUMONIAE (A) 07/27/2020 1640   REPTSTATUS 07/30/2020 FINAL 07/27/2020 1640    Other culture-see note  Objective: Vitals: Today's Vitals   09/14/20 2017 09/14/20 2300 09/15/20 0804 09/15/20 0813  BP: (!) 121/53  (!) 105/45   Pulse: 91  75   Resp: 17  17   Temp: 98.4 F (36.9 C)  97.8 F (36.6 C)   TempSrc: Oral  Oral   SpO2: 96%  100%   Weight:      PainSc:  Asleep  0-No pain   No intake or output data in the 24 hours ending 09/15/20 1021  Filed Weights   09/08/20 1622  Weight: 127.9 kg   Weight change:   Intake/Output from previous day: 07/25 0701 - 07/26 0700 In: 120 [P.O.:120] Out: -  Intake/Output this shift: No intake/output data recorded. Filed Weights   09/08/20 1622  Weight: 127.9 kg   Examination: General exam: AAOx 3 older than stated age, weak appearing. HEENT:Oral mucosa moist, Ear/Nose WNL grossly, dentition normal. Respiratory system: bilaterally diminished, , no use of accessory muscle Cardiovascular system: S1 & S2 +, No JVD,. Gastrointestinal system: Abdomen soft, NT,ND, obese BS+ Nervous System:Alert, awake, moving extremities and grossly nonfocal Extremities: no edema, distal peripheral pulses palpable.  Skin: No rashes,no icterus. MSK: Normal muscle bulk,tone, power  Data Reviewed: I have personally reviewed following labs and imaging studies CBC: Recent Labs  Lab 09/09/20 0208 09/10/20 0750  WBC 8.5 8.4  HGB 12.9 12.8  HCT 40.5 41.5  MCV 88.2 89.1  PLT 316 673    Basic Metabolic Panel: Recent Labs  Lab 09/08/20 1400 09/09/20 0208 09/11/20 0338 09/12/20 0352 09/13/20 0549 09/14/20 0129  NA 137 136 141 139 140 140  K 3.1* 2.9* 3.1* 3.1* 3.5 3.9  CL 86* 94* 105 104 106 105  CO2 29 27 25 25 24 26   GLUCOSE 126* 107* 126* 115* 100* 119*  BUN 139* 126*  63* 43* 31* 28*  CREATININE 3.13* 2.73* 1.73* 1.53* 1.38* 1.57*  CALCIUM 11.0* 10.4* 9.9 9.5 9.2 9.2  MG  --  2.4  --   --   --   --   PHOS 3.3  --  1.4* 2.7  --   --     GFR: Estimated Creatinine Clearance: 44.6 mL/min (A) (by C-G formula based on SCr of 1.57 mg/dL (H)). Liver Function Tests: Recent Labs  Lab 09/08/20 1400 09/11/20 0338  ALBUMIN 3.9 3.4*    No results for input(s): LIPASE, AMYLASE in the last 168 hours.  No results for input(s): AMMONIA in the last 168 hours. Coagulation Profile: No results for input(s): INR, PROTIME in the last 168 hours. Cardiac Enzymes: No results for input(s): CKTOTAL, CKMB, CKMBINDEX, TROPONINI in the last 168 hours.  BNP (last 3 results) No results for input(s): PROBNP  in the last 8760 hours. HbA1C: No results for input(s): HGBA1C in the last 72 hours. CBG: Recent Labs  Lab 09/14/20 0811 09/14/20 1108 09/14/20 1712 09/14/20 2019 09/15/20 0639  GLUCAP 99 155* 134* 149* 102*    Lipid Profile: No results for input(s): CHOL, HDL, LDLCALC, TRIG, CHOLHDL, LDLDIRECT in the last 72 hours. Thyroid Function Tests: No results for input(s): TSH, T4TOTAL, FREET4, T3FREE, THYROIDAB in the last 72 hours. Anemia Panel: No results for input(s): VITAMINB12, FOLATE, FERRITIN, TIBC, IRON, RETICCTPCT in the last 72 hours. Sepsis Labs: No results for input(s): PROCALCITON, LATICACIDVEN in the last 168 hours.   Recent Results (from the past 240 hour(s))  Resp Panel by RT-PCR (Flu A&B, Covid) Nasopharyngeal Swab     Status: None   Collection Time: 09/07/20  8:28 PM   Specimen: Nasopharyngeal Swab; Nasopharyngeal(NP) swabs in vial transport medium  Result Value Ref Range Status   SARS Coronavirus 2 by RT PCR NEGATIVE NEGATIVE Final    Comment: (NOTE) SARS-CoV-2 target nucleic acids are NOT DETECTED.  The SARS-CoV-2 RNA is generally detectable in upper respiratory specimens during the acute phase of infection. The lowest concentration of  SARS-CoV-2 viral copies this assay can detect is 138 copies/mL. A negative result does not preclude SARS-Cov-2 infection and should not be used as the sole basis for treatment or other patient management decisions. A negative result may occur with  improper specimen collection/handling, submission of specimen other than nasopharyngeal swab, presence of viral mutation(s) within the areas targeted by this assay, and inadequate number of viral copies(<138 copies/mL). A negative result must be combined with clinical observations, patient history, and epidemiological information. The expected result is Negative.  Fact Sheet for Patients:  EntrepreneurPulse.com.au  Fact Sheet for Healthcare Providers:  IncredibleEmployment.be  This test is no t yet approved or cleared by the [place] FDA and  has been authorized for detection and/or diagnosis of SARS-CoV-2 by FDA under an Emergency Use Authorization (EUA). This EUA will remain  in effect (meaning this test can be used) for the duration of the COVID-19 declaration under Section 564(b)(1) of the Act, 21 U.S.C.section 360bbb-3(b)(1), unless the authorization is terminated  or revoked sooner.       Influenza A by PCR NEGATIVE NEGATIVE Final   Influenza B by PCR NEGATIVE NEGATIVE Final    Comment: (NOTE) The Xpert Xpress SARS-CoV-2/FLU/RSV plus assay is intended as an aid in the diagnosis of influenza from Nasopharyngeal swab specimens and should not be used as a sole basis for treatment. Nasal washings and aspirates are unacceptable for Xpert Xpress SARS-CoV-2/FLU/RSV testing.  Fact Sheet for Patients: EntrepreneurPulse.com.au  Fact Sheet for Healthcare Providers: IncredibleEmployment.be  This test is not yet approved or cleared by the [place] FDA and has been authorized for detection and/or diagnosis of SARS-CoV-2 by FDA under an Emergency Use  Authorization (EUA). This EUA will remain in effect (meaning this test can be used) for the duration of the COVID-19 declaration under Section 564(b)(1) of the Act, 21 U.S.C. section 360bbb-3(b)(1), unless the authorization is terminated or revoked.  Performed at [facility], [place] [address], [place], [place] [address]       Radiology Studies: No results found.   LOS: 7 days   Antonieta Pert, MD [facility]  09/15/2020, 10:21 AM

## 2020-09-15 NOTE — TOC Progression Note (Addendum)
Transition of Care [facility]) - Progression Note    Patient Details  Name: [name] [name] MRN: [id number] Date of Birth: 1955/05/13  Transition of Care [facility]) CM/SW Contact  Sharin Mons, RN Phone Number: 09/15/2020, 3:08 PM  Clinical Narrative:    NCM spoke with Angelita Ingles / [facility] regarding LT/SNF placement. Angelita Ingles stated pt has been approved for indefinite contract for LT/SNF bed. Angelita Ingles stated she will email NCM a list with 8 facilities that referrals for LT/SNF can be made.   NCM will continue to monitor and assist with TOC needs...  09/15/2020 @ 1528  NCM received SNF referral list from the [facility]. NCM sent SNF referral via HUB to [facility] , however, they are without SNF BEDS.  09/15/2020 @ 1600  NCM faxed SNF referral to [facility]), [facility]), and [facility].  09/16/2020 @ 1326  NCM faxed SNF referral to [facility] [facility], [facility] and [facility] @ [place].  09/17/2020@ 1224  NCM left voice message with Tomma Rakers admission liaison/ [facility] [phone]) to learn of LT/SNF bed acceptance. Awaiting call back.  Voice message left with [facility] ([phone]) to learn of LT/SNF bed acceptance. Awaiting call back.  Voice message left with [facility] [phone]) to learn of LT/SNF bed acceptance. Awaiting call back.  NCM called [place]), voiice message left regarding LT/SNF bed acceptance.  [facility] [phone]), to learn of LT/SNF bed acceptance. Awaiting call back.  NCM spoke with Levada Dy / [facility] and [place] [phone]  LT/SNF bed acceptance. Per Levada Dy no LT/SNF beds available.   NCM called Ludger Nutting / [facility] [place] [phone]), voice message left regarding LT/SNF bed acceptance. Awaiting call back.  Expected  Discharge Plan: Long Term Acute Care (LTAC) Barriers to Discharge: Other (must enter comment) (no LT/SNf bed identified)  Expected Discharge Plan and Services Expected Discharge Plan: [place] (LTAC)   Discharge Planning Services: CM Consult   Living arrangements for the past 2 months: Single Family Home                           HH Arranged: RN, PT (Active with [place]) [place]: [place] ([facility]) Date [place]: 09/08/20 Time [place]: 1036 Representative spoke with at [place]: [place] ([place]) Interventions    Readmission Risk Interventions Readmission Risk Prevention Plan 02/06/2020 11/08/2019  Transportation Screening Complete Complete  Medication Review Press [facility]) - Referral to Pharmacy  PCP or Specialist appointment within 3-5 days of discharge Complete Complete  HRI or [place] - Complete  SW Recovery Care/Counseling Consult Complete Complete  Palliative Care Screening Not Applicable Not Applicable  Skilled Nursing Facility Complete Complete  Some recent data might be hidden

## 2020-09-16 DIAGNOSIS — E785 Hyperlipidemia, unspecified: Secondary | ICD-10-CM | POA: Diagnosis not present

## 2020-09-16 LAB — GLUCOSE, CAPILLARY
Glucose-Capillary: 124 mg/dL — ABNORMAL HIGH (ref 70–99)
Glucose-Capillary: 162 mg/dL — ABNORMAL HIGH (ref 70–99)
Glucose-Capillary: 134 mg/dL — ABNORMAL HIGH (ref 70–99)
Glucose-Capillary: 134 mg/dL — ABNORMAL HIGH (ref 70–99)

## 2020-09-16 LAB — BASIC METABOLIC PANEL
Anion gap: 8 (ref 5–15)
BUN: 20 mg/dL (ref 8–23)
CO2: 28 mmol/L (ref 22–32)
Calcium: 8.7 mg/dL — ABNORMAL LOW (ref 8.9–10.3)
Chloride: 103 mmol/L (ref 98–111)
Creatinine, Ser: 1.36 mg/dL — ABNORMAL HIGH (ref 0.44–1.00)
GFR, Estimated: 43 mL/min — ABNORMAL LOW (ref >60)
Glucose, Bld: 127 mg/dL — ABNORMAL HIGH (ref 70–99)
Potassium: 3.4 mmol/L — ABNORMAL LOW (ref 3.5–5.1)
Sodium: 139 mmol/L (ref 135–145)

## 2020-09-16 MED ORDER — LORAZEPAM 0.5 MG PO TABS
0.5000 mg | ORAL_TABLET | Freq: Once | ORAL | Status: AC
  Administered 2020-09-16: 0.5 mg via ORAL
  Filled 2020-09-16: qty 1

## 2020-09-16 MED ORDER — HYDROCORTISONE 1 % EX LOTN
TOPICAL_LOTION | Freq: Three times a day (TID) | CUTANEOUS | Status: DC
  Filled 2020-09-16: qty 118

## 2020-09-16 MED ORDER — HYDROCORTISONE 1 % EX CREA
TOPICAL_CREAM | Freq: Three times a day (TID) | CUTANEOUS | Status: DC
  Filled 2020-09-16: qty 28

## 2020-09-16 MED ORDER — HYDROXYZINE HCL 25 MG PO TABS
25.0000 mg | ORAL_TABLET | Freq: Three times a day (TID) | ORAL | Status: DC | PRN
  Administered 2020-09-16 – 2020-09-25 (×13): 25 mg via ORAL
  Filled 2020-09-16 (×13): qty 1

## 2020-09-16 MED ORDER — TOBRAMYCIN 0.3 % OP SOLN
2.0000 [drp] | Freq: Four times a day (QID) | OPHTHALMIC | Status: AC
  Administered 2020-09-16 – 2020-09-20 (×14): 2 [drp] via OPHTHALMIC
  Filled 2020-09-16: qty 5

## 2020-09-16 NOTE — Progress Notes (Signed)
PROGRESS NOTE    [name] [name]  JJO:[id number] DOB: May 15, 1955 DOA: 09/07/2020 PCP: Lavone Orn, MD   Chief Complaint  Patient presents with   Abnormal Lab  Brief Narrative: 65 year old female with history of CKD stage III, type 2 diabetes chronic diastolic CHF, OSA on CPAP admitted with melena by home health, upon presentation in the ED had nausea and showed AKI -patient was admitted, managed with IV fluid hydration.  Symptomatic management.  She had intractable nausea vomiting that resolved, renal function more or less stable.  Found to be very weak 2 ASSIST and since not much help at home . To see 2/5/ Toc involved, looking into multiple facilities  Subjective: Seen this morning complains of sticky eyes and generalized itching Afebrile overnight,?  Pulse ox 87%. Being cleaned by nursing staff this morning.  Assessment & Plan:  Intractable nausea and vomiting: Resolved.  Tolerating diet.    AKI on CKD stage IIIb:Suspect prerenal due to dehydration with poor oral intake.  Creatinine improved at 1.5, on admission 2.7.  Repeat labs today encourage oral hydration. Recent Labs  Lab 09/11/20 0338 09/12/20 0352 09/13/20 0549 09/14/20 0129  BUN 63* 43* 31* 28*  CREATININE 1.73* 1.53* 1.38* 1.57*   Hypokalemia: Resolved Recent Labs  Lab 09/11/20 0338 09/12/20 0352 09/13/20 0549 09/14/20 0129  K 3.1* 3.1* 3.5 3.9   History of upper GI bleeding duodenal ulcer on last endoscopy 11/15/2019:Continue PPI avoid NSAIDs  Pruritus: Benadryl as needed.  Increase dose of Atarax to 25 mg, add hydrocortisone  Type 2 diabetes mellitus with stage 3 chronic kidney disease :Hba1c 6.0 on 7/19,blood sugar well controlled.Cont ssi Recent Labs  Lab 09/15/20 0639 09/15/20 1103 09/15/20 1604 09/15/20 2006 09/16/20 0623  GLUCAP 102* 159* 149* 138* 124*   HLD: on lipitor  Morbid obesity with BMI 51:Will benefit with weight loss, healthy lifestyle  Chronic diastolic CHF:EF 16-01%,  compensated.  Filed Weights   09/08/20 1622  Weight: 127.9 kg   OSA on CPAP-cont same  Functional quadriplegia Deconditioning/debility  continue PT OT  Increased lipase likely from dehydration no abdominal pain  Anxiety issues: cont prn Atarax .  Complains of eye soreness/suspect conjunctivitis continue tobramycin eyedrop  Diet Order             Diet Carb Modified Fluid consistency: Thin; Room service appropriate? Yes  Diet effective now                   Nutrition Problem: Inadequate oral intake Etiology: nausea, poor appetite Signs/Symptoms: percent weight loss, meal completion < 25%, per patient/family report Percent weight loss: 12.2 % Interventions: Boost Breeze, MVI, Prostat Patient's Body mass index is 51.57 kg/m.  DVT prophylaxis: lovenox Code Status:   Code Status: Full Code  Family Communication: plan of care discussed with patient at bedside. Status is: Inpatient  Remains inpatient appropriate because:Unsafe d/c plan  Dispo: Patient From: Home  Planned Disposition: [place]  Medically stable for discharge: No   Unresulted Labs (From admission, onward)    None       Medications reviewed:  Scheduled Meds:  (feeding supplement) PROSource Plus  30 mL Oral TID BM   atorvastatin  10 mg Oral Daily   enoxaparin (LOVENOX) injection  60 mg Subcutaneous Q24H   insulin aspart  0-9 Units Subcutaneous TID WC   midodrine  15 mg Oral TID WC   mirtazapine  7.5 mg Oral QHS   multivitamin with minerals  1 tablet Oral Daily  pantoprazole (PROTONIX) IV  40 mg Intravenous Q24H   senna-docusate  1 tablet Oral BID   sucralfate  1 g Oral TID WC & HS   tobramycin  2 drop Both Eyes Q6H   Continuous Infusions: Consultants:see note  Procedures:see note Antimicrobials: Anti-infectives (From admission, onward)    None      Culture/Microbiology    Component Value Date/Time   SDES  07/27/2020 1640    URINE, RANDOM Performed at [facility], [place] [address] [place], [place] [address]    SPECREQUEST  07/27/2020 1640    NONE Performed at [facility], [place] [address], [place], [place] [address]    CULT 50,000 COLONIES/mL KLEBSIELLA PNEUMONIAE (A) 07/27/2020 1640   REPTSTATUS 07/30/2020 FINAL 07/27/2020 1640    Other culture-see note  Objective: Vitals: Today's Vitals   09/15/20 0804 09/15/20 0813 09/15/20 1422 09/15/20 2008  BP: (!) 105/45  100/62 (!) 107/51  Pulse: 75  92 89  Resp: 17  17 17   Temp: 97.8 F (36.6 C)  98.5 F (36.9 C) 98.5 F (36.9 C)  TempSrc: Oral  Oral Oral  SpO2: 100%  (!) 87% 99%  Weight:      PainSc:  0-No pain  0-No pain    Intake/Output Summary (Last 24 hours) at 09/16/2020 0725 Last data filed at 09/15/2020 1810 Gross per 24 hour  Intake --  Output 500 ml  Net -500 ml    Filed Weights   09/08/20 1622  Weight: 127.9 kg   Weight change:   Intake/Output from previous day: 07/26 0701 - 07/27 0700 In: -  Out: 500 [Urine:500] Intake/Output this shift: No intake/output data recorded. Filed Weights   09/08/20 1622  Weight: 127.9 kg   Examination: General exam: AAOx 3,obese,older than stated age, weak appearing. HEENT:Oral mucosa moist, Ear/Nose WNL grossly, dentition normal.  Mild redness of eyes Respiratory system: bilaterally clear without added sounds, no use of accessory muscle Cardiovascular system: S1 & S2 +, No JVD,. Gastrointestinal system: Abdomen soft, obese NT,ND, BS+ Nervous System:Alert, awake, moving extremities and grossly nonfocal Extremities: no edema, distal peripheral pulses palpable.  Skin: No rashes,no icterus. MSK: Normal muscle bulk,tone, power.   Data Reviewed: I have personally reviewed following labs and imaging studies CBC: Recent Labs  Lab 09/10/20 0750  WBC 8.4  HGB 12.8  HCT 41.5  MCV 89.1  PLT 324    Basic Metabolic Panel: Recent Labs  Lab 09/11/20 0338 09/12/20 0352 09/13/20 0549  09/14/20 0129  NA 141 139 140 140  K 3.1* 3.1* 3.5 3.9  CL 105 104 106 105  CO2 25 25 24 26   GLUCOSE 126* 115* 100* 119*  BUN 63* 43* 31* 28*  CREATININE 1.73* 1.53* 1.38* 1.57*  CALCIUM 9.9 9.5 9.2 9.2  PHOS 1.4* 2.7  --   --     GFR: Estimated Creatinine Clearance: 44.6 mL/min (A) (by C-G formula based on SCr of 1.57 mg/dL (H)). Liver Function Tests: Recent Labs  Lab 09/11/20 0338  ALBUMIN 3.4*    No results for input(s): LIPASE, AMYLASE in the last 168 hours.  No results for input(s): AMMONIA in the last 168 hours. Coagulation Profile: No results for input(s): INR, PROTIME in the last 168 hours. Cardiac Enzymes: No results for input(s): CKTOTAL, CKMB, CKMBINDEX, TROPONINI in the last 168 hours.  BNP (last 3 results) No results for input(s): PROBNP in the last 8760 hours. HbA1C: No results for input(s): HGBA1C in the last 72 hours. CBG:  Recent Labs  Lab 09/15/20 0639 09/15/20 1103 09/15/20 1604 09/15/20 2006 09/16/20 0623  GLUCAP 102* 159* 149* 138* 124*    Lipid Profile: No results for input(s): CHOL, HDL, LDLCALC, TRIG, CHOLHDL, LDLDIRECT in the last 72 hours. Thyroid Function Tests: No results for input(s): TSH, T4TOTAL, FREET4, T3FREE, THYROIDAB in the last 72 hours. Anemia Panel: No results for input(s): VITAMINB12, FOLATE, FERRITIN, TIBC, IRON, RETICCTPCT in the last 72 hours. Sepsis Labs: No results for input(s): PROCALCITON, LATICACIDVEN in the last 168 hours.   Recent Results (from the past 240 hour(s))  Resp Panel by RT-PCR (Flu A&B, Covid) Nasopharyngeal Swab     Status: None   Collection Time: 09/07/20  8:28 PM   Specimen: Nasopharyngeal Swab; Nasopharyngeal(NP) swabs in vial transport medium  Result Value Ref Range Status   SARS Coronavirus 2 by RT PCR NEGATIVE NEGATIVE Final    Comment: (NOTE) SARS-CoV-2 target nucleic acids are NOT DETECTED.  The SARS-CoV-2 RNA is generally detectable in upper respiratory specimens during the acute  phase of infection. The lowest concentration of SARS-CoV-2 viral copies this assay can detect is 138 copies/mL. A negative result does not preclude SARS-Cov-2 infection and should not be used as the sole basis for treatment or other patient management decisions. A negative result may occur with  improper specimen collection/handling, submission of specimen other than nasopharyngeal swab, presence of viral mutation(s) within the areas targeted by this assay, and inadequate number of viral copies(<138 copies/mL). A negative result must be combined with clinical observations, patient history, and epidemiological information. The expected result is Negative.  Fact Sheet for Patients:  EntrepreneurPulse.com.au  Fact Sheet for Healthcare Providers:  IncredibleEmployment.be  This test is no t yet approved or cleared by the [place] FDA and  has been authorized for detection and/or diagnosis of SARS-CoV-2 by FDA under an Emergency Use Authorization (EUA). This EUA will remain  in effect (meaning this test can be used) for the duration of the COVID-19 declaration under Section 564(b)(1) of the Act, 21 U.S.C.section 360bbb-3(b)(1), unless the authorization is terminated  or revoked sooner.       Influenza A by PCR NEGATIVE NEGATIVE Final   Influenza B by PCR NEGATIVE NEGATIVE Final    Comment: (NOTE) The Xpert Xpress SARS-CoV-2/FLU/RSV plus assay is intended as an aid in the diagnosis of influenza from Nasopharyngeal swab specimens and should not be used as a sole basis for treatment. Nasal washings and aspirates are unacceptable for Xpert Xpress SARS-CoV-2/FLU/RSV testing.  Fact Sheet for Patients: EntrepreneurPulse.com.au  Fact Sheet for Healthcare Providers: IncredibleEmployment.be  This test is not yet approved or cleared by the [place] FDA and has been authorized for detection and/or diagnosis of  SARS-CoV-2 by FDA under an Emergency Use Authorization (EUA). This EUA will remain in effect (meaning this test can be used) for the duration of the COVID-19 declaration under Section 564(b)(1) of the Act, 21 U.S.C. section 360bbb-3(b)(1), unless the authorization is terminated or revoked.  Performed at [facility], [place] [address], [place], [place] [address]       Radiology Studies: No results found.   LOS: 8 days   Antonieta Pert, MD [facility]  09/16/2020, 7:25 AM

## 2020-09-17 DIAGNOSIS — E785 Hyperlipidemia, unspecified: Secondary | ICD-10-CM | POA: Diagnosis not present

## 2020-09-17 LAB — GLUCOSE, CAPILLARY
Glucose-Capillary: 107 mg/dL — ABNORMAL HIGH (ref 70–99)
Glucose-Capillary: 132 mg/dL — ABNORMAL HIGH (ref 70–99)
Glucose-Capillary: 149 mg/dL — ABNORMAL HIGH (ref 70–99)
Glucose-Capillary: 147 mg/dL — ABNORMAL HIGH (ref 70–99)

## 2020-09-17 MED ORDER — POTASSIUM CHLORIDE CRYS ER 20 MEQ PO TBCR
20.0000 meq | EXTENDED_RELEASE_TABLET | Freq: Once | ORAL | Status: AC
  Administered 2020-09-17: 20 meq via ORAL
  Filled 2020-09-17: qty 1

## 2020-09-17 NOTE — Progress Notes (Signed)
Nutrition Follow-up  DOCUMENTATION CODES:   Morbid obesity  INTERVENTION:   -Continue 30 ml Prosource Plus TID with meals, each supplement provides 100 kcals and 15 grams protein -Continue MVI with minerals daily -Magic cup TID with meals, each supplement provides 290 kcal and 9 grams of protein   NUTRITION DIAGNOSIS:   Inadequate oral intake related to nausea, poor appetite as evidenced by percent weight loss, meal completion < 25%, per patient/family report.  Ongoing  GOAL:   Patient will meet greater than or equal to 90% of their needs  Progressing   MONITOR:   PO intake, Supplement acceptance, Diet advancement, Labs, Weight trends, Skin, I & O's  REASON FOR ASSESSMENT:   Consult Assessment of nutrition requirement/status  ASSESSMENT:   [name] [name] is a 65 y.o. female with medical history significant of      CKD 3, DM 2, Chronic diastolic CHF, OSA on CPAP  7/20- advanced to carb modified diet   Reviewed I/O's: -650 ml x 24 hours and +3 L since admission  UOP: 650 ml x 24 hours  Pt talking on phone at time of visit. Observed breakfast tray- pt consumed 100% of meal tray. Noted documented meal completions 75-100%, which is a significant improvement since last visit.   Per TOC notes, pt awaiting SNF placement.   Medications reviewed and include remeron and senokot.   Lab Results  Component Value Date   HGBA1C 6.0 (H) 09/08/2020   PTA DM medications are .   Labs reviewed: K: 3.4, CBGS: 107-162 (inpatient orders for glycemic control are 0-9 units insulin aspart TID with meals).    Diet Order:   Diet Order             Diet Carb Modified Fluid consistency: Thin; Room service appropriate? Yes  Diet effective now                   EDUCATION NEEDS:   Education needs have been addressed  Skin:  Skin Assessment: Skin Integrity Issues: Skin Integrity Issues:: Other (Comment) Other: non-pressure wound to lt flank, dehisced abdominal  incision  Last BM:  09/16/20  Height:   Ht Readings from Last 1 Encounters:  08/26/20 5\' 2"  (1.575 m)    Weight:   Wt Readings from Last 1 Encounters:  09/08/20 127.9 kg    Ideal Body Weight:  50 kg  BMI:  Body mass index is 51.57 kg/m.  Estimated Nutritional Needs:   Kcal:  1800-2000  Protein:  110-125 grams  Fluid:  > 1.8 L    Loistine Chance, RD, LDN, [place] Registered Dietitian II Certified Diabetes Care and Education Specialist Please refer to [facility] for RD and/or RD on-call/weekend/after hours pager

## 2020-09-17 NOTE — Progress Notes (Signed)
PROGRESS NOTE    [name] [name]  VOJ:[id number] DOB: [birth year] DOA: 09/07/2020 PCP: Lavone Orn, MD   Chief Complaint  Patient presents with   Abnormal Lab  Brief Narrative: 65 year old female with history of CKD stage III, type 2 diabetes chronic diastolic CHF, OSA on CPAP admitted with melena by home health, upon presentation in the ED had nausea and showed AKI -patient was admitted, managed with IV fluid hydration.  Symptomatic management.  She had intractable nausea vomiting that resolved, renal function more or less stable.  Found to be very weak 2 ASSIST and since not much help at home . Toc involved, looking into multiple facilities. [facility]  Subjective: Seen and examined this morning spoke with nursing staff, patient has no concern.  Assessment & Plan:  Intractable nausea and vomiting: Resolved.  Tolerating diet.    AKI on CKD stage IIIb:Suspect prerenal due to dehydration with poor oral intake.Creatinine improved at 1.3.On admission 2.7.Repeat labs reviewed form 7/27 Recent Labs  Lab 09/11/20 0338 09/12/20 0352 09/13/20 0549 09/14/20 0129 09/16/20 0902  BUN 63* 43* 31* 28* 20  CREATININE 1.73* 1.53* 1.38* 1.57* 1.36*   Hypokalemia: We will give oral potassium supplement r Recent Labs  Lab 09/11/20 0338 09/12/20 0352 09/13/20 0549 09/14/20 0129 09/16/20 0902  K 3.1* 3.1* 3.5 3.9 3.4*   History of upper GI bleeding duodenal ulcer on last endoscopy 11/15/2019:Continue PPI avoid NSAIDs  Pruritus: Improved after increasing Atarax Atarax to 25 mg prn, on hydrocortisone lotion.  Type 2 diabetes mellitus with stage 3 chronic kidney disease :Hba1c 6.0 on 7/19,blood sugar well controlled.Cont ssi Recent Labs  Lab 09/16/20 1123 09/16/20 1610 09/16/20 2043 09/17/20 0618 09/17/20 1106  GLUCAP 162* 134* 134* 107* 132*   XHB:ZJIR lipitor  Morbid obesity with BMI 51:Will benefit with weight loss, healthy lifestyle.  Chronic diastolic CHF:EF 67-89%,  compensated.  Filed Weights   09/08/20 1622  Weight: 127.9 kg   OSA on CPAP-cont same  Functional quadriplegia Deconditioning/debility  continue PT OT  Increased lipase likely from dehydration no abdominal pain  Anxiety issues: cont prn Atarax .  Complains of eye soreness/suspect conjunctivitis continue tobramycin eyedrop  Diet Order             Diet Carb Modified Fluid consistency: Thin; Room service appropriate? Yes  Diet effective now                   Nutrition Problem: Inadequate oral intake Etiology: nausea, poor appetite Signs/Symptoms: percent weight loss, meal completion < 25%, per patient/family report Percent weight loss: 12.2 % Interventions: Boost Breeze, MVI, Prostat Patient's Body mass index is 51.57 kg/m.  DVT prophylaxis: lovenox Code Status:   Code Status: Full Code  Family Communication: plan of care discussed with patient at bedside. Status is: Inpatient  Remains inpatient appropriate because:Unsafe d/c plan  Dispo: Patient From: Home  Planned Disposition: [place]  Medically stable for discharge: No   Unresulted Labs (From admission, onward)    None       Medications reviewed:  Scheduled Meds:  (feeding supplement) PROSource Plus  30 mL Oral TID BM   atorvastatin  10 mg Oral Daily   enoxaparin (LOVENOX) injection  60 mg Subcutaneous Q24H   hydrocortisone cream   Topical TID   insulin aspart  0-9 Units Subcutaneous TID WC   midodrine  15 mg Oral TID WC   mirtazapine  7.5 mg Oral QHS   multivitamin with minerals  1 tablet Oral  Daily   pantoprazole (PROTONIX) IV  40 mg Intravenous Q24H   senna-docusate  1 tablet Oral BID   sucralfate  1 g Oral TID WC & HS   tobramycin  2 drop Both Eyes Q6H   Continuous Infusions: Consultants:see note  Procedures:see note Antimicrobials: Anti-infectives (From admission, onward)    None      Culture/Microbiology    Component Value Date/Time   SDES  07/27/2020 1640     URINE, RANDOM Performed at [facility], [place] [address] [place], [place] [address]    SPECREQUEST  07/27/2020 1640    NONE Performed at [facility], [place] [address] [place], [place] [address]    CULT 50,000 COLONIES/mL KLEBSIELLA PNEUMONIAE (A) 07/27/2020 1640   REPTSTATUS 07/30/2020 FINAL 07/27/2020 1640    Other culture-see note  Objective: Vitals: Today's Vitals   09/16/20 1316 09/16/20 2000 09/16/20 2200 09/17/20 0805  BP: 118/63  123/61 (!) 107/50  Pulse: 86  91 75  Resp: 18  18 17   Temp: 98.5 F (36.9 C)  98.2 F (36.8 C) 97.7 F (36.5 C)  TempSrc: Oral  Oral Oral  SpO2: 100%  99% 99%  Weight:      PainSc: Asleep 0-No pain      Intake/Output Summary (Last 24 hours) at 09/17/2020 1319 Last data filed at 09/17/2020 0300 Gross per 24 hour  Intake --  Output 650 ml  Net -650 ml    Filed Weights   09/08/20 1622  Weight: 127.9 kg   Weight change:   Intake/Output from previous day: 07/27 0701 - 07/28 0700 In: -  Out: 650 [Urine:650] Intake/Output this shift: No intake/output data recorded. Filed Weights   09/08/20 1622  Weight: 127.9 kg   Examination: General exam: AAOx3, older than stated age, weak appearing. HEENT:Oral mucosa moist, Ear/Nose WNL grossly, dentition normal. Respiratory system: bilaterally diminished, no use of accessory muscle Cardiovascular system: S1 & S2 +, No JVD,. Gastrointestinal system: Abdomen soft,NT,ND, BS+ Nervous System:Alert, awake, moving extremities and grossly nonfocal Extremities: no edema, distal peripheral pulses palpable.  Skin: No rashes,no icterus. MSK: Normal muscle bulk,tone, power    Data Reviewed: I have personally reviewed following labs and imaging studies CBC: No results for input(s): WBC, NEUTROABS, HGB, HCT, MCV, PLT in the last 168 hours.  Basic Metabolic Panel: Recent Labs  Lab 09/11/20 0338 09/12/20 0352 09/13/20 0549 09/14/20 0129 09/16/20 0902  NA 141  139 140 140 139  K 3.1* 3.1* 3.5 3.9 3.4*  CL 105 104 106 105 103  CO2 25 25 24 26 28   GLUCOSE 126* 115* 100* 119* 127*  BUN 63* 43* 31* 28* 20  CREATININE 1.73* 1.53* 1.38* 1.57* 1.36*  CALCIUM 9.9 9.5 9.2 9.2 8.7*  PHOS 1.4* 2.7  --   --   --     GFR: Estimated Creatinine Clearance: 51.5 mL/min (A) (by C-G formula based on SCr of 1.36 mg/dL (H)). Liver Function Tests: Recent Labs  Lab 09/11/20 0338  ALBUMIN 3.4*    No results for input(s): LIPASE, AMYLASE in the last 168 hours.  No results for input(s): AMMONIA in the last 168 hours. Coagulation Profile: No results for input(s): INR, PROTIME in the last 168 hours. Cardiac Enzymes: No results for input(s): CKTOTAL, CKMB, CKMBINDEX, TROPONINI in the last 168 hours.  BNP (last 3 results) No results for input(s): PROBNP in the last 8760 hours. HbA1C: No results for input(s): HGBA1C in the last 72 hours. CBG: Recent Labs  Lab  09/16/20 1123 09/16/20 1610 09/16/20 2043 09/17/20 0618 09/17/20 1106  GLUCAP 162* 134* 134* 107* 132*    Lipid Profile: No results for input(s): CHOL, HDL, LDLCALC, TRIG, CHOLHDL, LDLDIRECT in the last 72 hours. Thyroid Function Tests: No results for input(s): TSH, T4TOTAL, FREET4, T3FREE, THYROIDAB in the last 72 hours. Anemia Panel: No results for input(s): VITAMINB12, FOLATE, FERRITIN, TIBC, IRON, RETICCTPCT in the last 72 hours. Sepsis Labs: No results for input(s): PROCALCITON, LATICACIDVEN in the last 168 hours.   Recent Results (from the past 240 hour(s))  Resp Panel by RT-PCR (Flu A&B, Covid) Nasopharyngeal Swab     Status: None   Collection Time: 09/07/20  8:28 PM   Specimen: Nasopharyngeal Swab; Nasopharyngeal(NP) swabs in vial transport medium  Result Value Ref Range Status   SARS Coronavirus 2 by RT PCR NEGATIVE NEGATIVE Final    Comment: (NOTE) SARS-CoV-2 target nucleic acids are NOT DETECTED.  The SARS-CoV-2 RNA is generally detectable in upper respiratory specimens  during the acute phase of infection. The lowest concentration of SARS-CoV-2 viral copies this assay can detect is 138 copies/mL. A negative result does not preclude SARS-Cov-2 infection and should not be used as the sole basis for treatment or other patient management decisions. A negative result may occur with  improper specimen collection/handling, submission of specimen other than nasopharyngeal swab, presence of viral mutation(s) within the areas targeted by this assay, and inadequate number of viral copies(<138 copies/mL). A negative result must be combined with clinical observations, patient history, and epidemiological information. The expected result is Negative.  Fact Sheet for Patients:  EntrepreneurPulse.com.au  Fact Sheet for Healthcare Providers:  IncredibleEmployment.be  This test is no t yet approved or cleared by the [place] FDA and  has been authorized for detection and/or diagnosis of SARS-CoV-2 by FDA under an Emergency Use Authorization (EUA). This EUA will remain  in effect (meaning this test can be used) for the duration of the COVID-19 declaration under Section 564(b)(1) of the Act, 21 U.S.C.section 360bbb-3(b)(1), unless the authorization is terminated  or revoked sooner.       Influenza A by PCR NEGATIVE NEGATIVE Final   Influenza B by PCR NEGATIVE NEGATIVE Final    Comment: (NOTE) The Xpert Xpress SARS-CoV-2/FLU/RSV plus assay is intended as an aid in the diagnosis of influenza from Nasopharyngeal swab specimens and should not be used as a sole basis for treatment. Nasal washings and aspirates are unacceptable for Xpert Xpress SARS-CoV-2/FLU/RSV testing.  Fact Sheet for Patients: EntrepreneurPulse.com.au  Fact Sheet for Healthcare Providers: IncredibleEmployment.be  This test is not yet approved or cleared by the [place] FDA and has been authorized for detection  and/or diagnosis of SARS-CoV-2 by FDA under an Emergency Use Authorization (EUA). This EUA will remain in effect (meaning this test can be used) for the duration of the COVID-19 declaration under Section 564(b)(1) of the Act, 21 U.S.C. section 360bbb-3(b)(1), unless the authorization is terminated or revoked.  Performed at [facility], [place] [address], [place], [place] [address]       Radiology Studies: No results found.   LOS: 9 days   Antonieta Pert, MD [facility]  09/17/2020, 1:19 PM

## 2020-09-18 DIAGNOSIS — E785 Hyperlipidemia, unspecified: Secondary | ICD-10-CM | POA: Diagnosis not present

## 2020-09-18 LAB — GLUCOSE, CAPILLARY
Glucose-Capillary: 107 mg/dL — ABNORMAL HIGH (ref 70–99)
Glucose-Capillary: 109 mg/dL — ABNORMAL HIGH (ref 70–99)
Glucose-Capillary: 137 mg/dL — ABNORMAL HIGH (ref 70–99)
Glucose-Capillary: 120 mg/dL — ABNORMAL HIGH (ref 70–99)

## 2020-09-18 MED ORDER — ESCITALOPRAM OXALATE 10 MG PO TABS
5.0000 mg | ORAL_TABLET | Freq: Every day | ORAL | Status: DC
  Administered 2020-09-18 – 2020-09-25 (×7): 5 mg via ORAL
  Filled 2020-09-18 (×8): qty 1

## 2020-09-18 MED ORDER — KETOROLAC TROMETHAMINE 0.5 % OP SOLN
1.0000 [drp] | Freq: Three times a day (TID) | OPHTHALMIC | Status: DC | PRN
Start: 2020-09-18 — End: 2020-09-26
  Administered 2020-09-22 – 2020-09-25 (×4): 1 [drp] via OPHTHALMIC
  Filled 2020-09-18: qty 5

## 2020-09-18 NOTE — TOC Progression Note (Addendum)
Transition of Care [facility]) - Progression Note    Patient Details  Name: [name] [name] MRN: [id number] Date of Birth: 1956-01-19  Transition of [facility]) CM/SW Contact  Sharin Mons, RN Phone Number: 09/18/2020, 4:14 PM  Clinical Narrative:     Pt without LT/SNF bed offer, once obtained [place] [phone]) needs notifying to generate authorization and send to the accepted facility.  8:18 am  NCM received call from [facility] [phone]) by voice message left. Maggie informed NCM pt exceeds care they can provide, no LT/SNF bed offered.  1523 pm Tanika / [facility] [place] [phone]) called and left voice message ( seeking more information in order to make determination for extending LT/SNF BED). NCM called Tanika back, Tanika had left for the day. NCM to f/u on Monday.  TOC team will continue to assist with TOC needs...   09/23/2020 @ 1448 NCM received a call from [employer]) requesting update on pt transitioning to LT/SNF. Update given ... no bed offers extended as of yet. Contact information given for pt's [facility] SW: Lemmie [phone]) and Elizabeth [facility] [place] [phone]).  Expected Discharge Plan: Long Term Nursing Home Barriers to Discharge: Other (must enter comment) (no LT/SNF bed offer)  Expected Discharge Plan and Services Expected Discharge Plan: [place]   Discharge Planning Services: CM Consult   Living arrangements for the past 2 months: Single Family Home                           HH Arranged: RN, PT (Active with [place]) [place]: [place] ([place]) Date [place]: 09/08/20 Time [place]: 1036 Representative spoke with at [place]: [place] ([place]) Interventions    Readmission Risk Interventions Readmission Risk Prevention Plan 02/06/2020 11/08/2019  Transportation Screening  Complete Complete  Medication Review Press photographer) - Referral to Pharmacy  PCP or Specialist appointment within 3-5 days of discharge Complete Complete  HRI or [place] - Complete  SW Recovery Care/Counseling Consult Complete Complete  Palliative Care Screening Not Applicable Not Applicable  Skilled Nursing Facility Complete Complete  Some recent data might be hidden

## 2020-09-18 NOTE — Plan of Care (Signed)

## 2020-09-18 NOTE — Progress Notes (Signed)
PROGRESS NOTE    [name] [name]  IYM:[id number] DOB: 1955-09-06 DOA: 09/07/2020 PCP: Lavone Orn, MD   Chief Complaint  Patient presents with   Abnormal Lab  Brief Narrative: 65 year old female with history of CKD stage III, type 2 diabetes chronic diastolic CHF, OSA on CPAP admitted with melena by home health, upon presentation in the ED had nausea and showed AKI -patient was admitted, managed with IV fluid hydration.  Symptomatic management.  She had intractable nausea vomiting that resolved, renal function more or less stable.  Found to be very weak 2 ASSIST and since not much help at home . Toc involved, looking into multiple facilities. [facility]  Subjective: Seen this morning.  "Give me something for nerve", 'I used to be on Xanax but not anymore".  Give me something for eye itching   Assessment & Plan:  Intractable nausea and vomiting: Resolved.  Tolerating diet.    AKI on CKD stage IIIb:Suspect prerenal due to dehydration with poor oral intake.creatinine stable and improved at baseline.  Encourage oral hydration.  Creatinine improved at 1.3.On admission 2.7. Recent Labs  Lab 09/12/20 0352 09/13/20 0549 09/14/20 0129 09/16/20 0902  BUN 43* 31* 28* 20  CREATININE 1.53* 1.38* 1.57* 1.36*   Hypokalemia: Was repleted orally.   Recent Labs  Lab 09/12/20 0352 09/13/20 0549 09/14/20 0129 09/16/20 0902  K 3.1* 3.5 3.9 3.4*   History of upper GI bleeding duodenal ulcer on last endoscopy 11/15/2019:Continue PPI avoid NSAIDs  Pruritus: Improved after increasing Atarax Atarax to 25 mg prn, on hydrocortisone lotion.  Type 2 diabetes mellitus with stage 3 chronic kidney disease :Hba1c 6.0 on 7/19, blood sugar well controlled.Cont ssi Recent Labs  Lab 09/17/20 0618 09/17/20 1106 09/17/20 1613 09/17/20 2118 09/18/20 0633  GLUCAP 107* 132* 149* 147* 107*   HWK:GSUP lipitor  Morbid obesity with BMI 51:Will benefit with weight loss, healthy lifestyle.  Chronic  diastolic CHF:EF 10-31%, compensated.  Filed Weights   09/08/20 1622  Weight: 127.9 kg   OSA on CPAP-cont same  Functional quadriplegia Deconditioning/debility  continue PT OT  Increased lipase likely from dehydration no abdominal pain  Anxiety issues/endorses depression: cont prn Atarax, agrees to be on Lexapro.  Complains of eye soreness/suspect conjunctivitis-continue tobramycin eyedrop, add additional drops to help with itching.  Diet Order             Diet Carb Modified Fluid consistency: Thin; Room service appropriate? Yes  Diet effective now                   Nutrition Problem: Inadequate oral intake Etiology: nausea, poor appetite Signs/Symptoms: percent weight loss, meal completion < 25%, per patient/family report Percent weight loss: 12.2 % Interventions: Boost Breeze, MVI, Prostat Patient's Body mass index is 51.57 kg/m.  DVT prophylaxis: lovenox Code Status:   Code Status: Full Code  Family Communication: plan of care discussed with patient at bedside. Status is: Inpatient Remains inpatient appropriate because:Unsafe d/c plan Dispo: Patient From: Home  Planned Disposition: [place]  Medically stable for discharge: No   Unresulted Labs (From admission, onward)    None       Medications reviewed:  Scheduled Meds:  (feeding supplement) PROSource Plus  30 mL Oral TID BM   atorvastatin  10 mg Oral Daily   enoxaparin (LOVENOX) injection  60 mg Subcutaneous Q24H   escitalopram  5 mg Oral Daily   hydrocortisone cream   Topical TID   insulin aspart  0-9 Units  Subcutaneous TID WC   midodrine  15 mg Oral TID WC   mirtazapine  7.5 mg Oral QHS   multivitamin with minerals  1 tablet Oral Daily   pantoprazole (PROTONIX) IV  40 mg Intravenous Q24H   senna-docusate  1 tablet Oral BID   sucralfate  1 g Oral TID WC & HS   tobramycin  2 drop Both Eyes Q6H   Continuous Infusions: Consultants:see note  Procedures:see  note Antimicrobials: Anti-infectives (From admission, onward)    None      Culture/Microbiology    Component Value Date/Time   SDES  07/27/2020 1640    URINE, RANDOM Performed at [facility], [place] [address], [place], [place] [address]    SPECREQUEST  07/27/2020 1640    NONE Performed at [facility], [place] [address], [place], [place] [address]    CULT 50,000 COLONIES/mL KLEBSIELLA PNEUMONIAE (A) 07/27/2020 1640   REPTSTATUS 07/30/2020 FINAL 07/27/2020 1640    Other culture-see note  Objective: Vitals: Today's Vitals   09/17/20 2130 09/17/20 2136 09/17/20 2210 09/18/20 0800  BP:  (!) 128/45    Pulse:  92    Resp:  18    Temp:  98.2 F (36.8 C)    TempSrc:  Oral    SpO2:  100%    Weight:      PainSc: 0-No pain  2  0-No pain    Intake/Output Summary (Last 24 hours) at 09/18/2020 0840 Last data filed at 09/18/2020 0751 Gross per 24 hour  Intake --  Output 700 ml  Net -700 ml    Filed Weights   09/08/20 1622  Weight: 127.9 kg   Weight change:   Intake/Output from previous day: No intake/output data recorded. Intake/Output this shift: Total I/O In: -  Out: 700 [Urine:700] Filed Weights   09/08/20 1622  Weight: 127.9 kg   Examination: General exam: AAOx x older than stated age, weak appearing. HEENT:Oral mucosa moist, Ear/Nose WNL grossly, dentition normal. Respiratory system: bilaterally diminished,  no use of accessory muscle Cardiovascular system: S1 & S2 +, No JVD,. Gastrointestinal system: Abdomen soft,obese NT,ND, BS+ Nervous System:Alert, awake, moving extremities and grossly nonfocal Extremities: no edema, distal peripheral pulses palpable.  Skin: No rashes,no icterus. MSK: Normal muscle bulk,tone, power    Data Reviewed: I have personally reviewed following labs and imaging studies CBC: No results for input(s): WBC, NEUTROABS, HGB, HCT, MCV, PLT in the last 168 hours.  Basic Metabolic Panel: Recent  Labs  Lab 09/12/20 0352 09/13/20 0549 09/14/20 0129 09/16/20 0902  NA 139 140 140 139  K 3.1* 3.5 3.9 3.4*  CL 104 106 105 103  CO2 25 24 26 28   GLUCOSE 115* 100* 119* 127*  BUN 43* 31* 28* 20  CREATININE 1.53* 1.38* 1.57* 1.36*  CALCIUM 9.5 9.2 9.2 8.7*  PHOS 2.7  --   --   --     GFR: Estimated Creatinine Clearance: 51.5 mL/min (A) (by C-G formula based on SCr of 1.36 mg/dL (H)). Liver Function Tests: No results for input(s): AST, ALT, ALKPHOS, BILITOT, PROT, ALBUMIN in the last 168 hours.  No results for input(s): LIPASE, AMYLASE in the last 168 hours.  No results for input(s): AMMONIA in the last 168 hours. Coagulation Profile: No results for input(s): INR, PROTIME in the last 168 hours. Cardiac Enzymes: No results for input(s): CKTOTAL, CKMB, CKMBINDEX, TROPONINI in the last 168 hours.  BNP (last 3 results) No results for input(s): PROBNP in the last  8760 hours. HbA1C: No results for input(s): HGBA1C in the last 72 hours. CBG: Recent Labs  Lab 09/17/20 0618 09/17/20 1106 09/17/20 1613 09/17/20 2118 09/18/20 0633  GLUCAP 107* 132* 149* 147* 107*    Lipid Profile: No results for input(s): CHOL, HDL, LDLCALC, TRIG, CHOLHDL, LDLDIRECT in the last 72 hours. Thyroid Function Tests: No results for input(s): TSH, T4TOTAL, FREET4, T3FREE, THYROIDAB in the last 72 hours. Anemia Panel: No results for input(s): VITAMINB12, FOLATE, FERRITIN, TIBC, IRON, RETICCTPCT in the last 72 hours. Sepsis Labs: No results for input(s): PROCALCITON, LATICACIDVEN in the last 168 hours.   No results found for this or any previous visit (from the past 240 hour(s)).     Radiology Studies: No results found.   LOS: 10 days   Antonieta Pert, MD [facility]  09/18/2020, 8:40 AM

## 2020-09-18 NOTE — Consult Note (Signed)
   [facility] Inpatient Consult   09/18/2020  [name] [name] 1956/02/11 [id number]  [place] Organization [ACO] Patient: [name] Benefits/[employer] Medicare   Patient screened for hospitalization with noted extreme high risk score for unplanned readmission risk and for length of stay to assess for potential [place] Management service needs for post hospital transition.  Review of patient's medical record reveals patient is for long term care   Plan:  Sign off, needs to be met with the [employer]    For questions contact:   Natividad Brood, RN BSN [facility] Liaison  [phone] business mobile phone Toll free office [phone]  Fax number: [phone] [email or web address]

## 2020-09-19 DIAGNOSIS — E785 Hyperlipidemia, unspecified: Secondary | ICD-10-CM | POA: Diagnosis not present

## 2020-09-19 LAB — CBC
HCT: 34.8 % — ABNORMAL LOW (ref 36.0–46.0)
Hemoglobin: 10.8 g/dL — ABNORMAL LOW (ref 12.0–15.0)
MCH: 27.7 pg (ref 26.0–34.0)
MCHC: 31 g/dL (ref 30.0–36.0)
MCV: 89.2 fL (ref 80.0–100.0)
Platelets: 337 10*3/uL (ref 150–400)
RBC: 3.9 MIL/uL (ref 3.87–5.11)
RDW: 16.3 % — ABNORMAL HIGH (ref 11.5–15.5)
WBC: 5.1 10*3/uL (ref 4.0–10.5)
nRBC: 0 % (ref 0.0–0.2)

## 2020-09-19 LAB — BASIC METABOLIC PANEL
Anion gap: 11 (ref 5–15)
BUN: 18 mg/dL (ref 8–23)
CO2: 25 mmol/L (ref 22–32)
Calcium: 8.1 mg/dL — ABNORMAL LOW (ref 8.9–10.3)
Chloride: 103 mmol/L (ref 98–111)
Creatinine, Ser: 1.41 mg/dL — ABNORMAL HIGH (ref 0.44–1.00)
GFR, Estimated: 41 mL/min — ABNORMAL LOW (ref >60)
Glucose, Bld: 124 mg/dL — ABNORMAL HIGH (ref 70–99)
Potassium: 3.9 mmol/L (ref 3.5–5.1)
Sodium: 139 mmol/L (ref 135–145)

## 2020-09-19 LAB — GLUCOSE, CAPILLARY
Glucose-Capillary: 123 mg/dL — ABNORMAL HIGH (ref 70–99)
Glucose-Capillary: 146 mg/dL — ABNORMAL HIGH (ref 70–99)
Glucose-Capillary: 121 mg/dL — ABNORMAL HIGH (ref 70–99)
Glucose-Capillary: 122 mg/dL — ABNORMAL HIGH (ref 70–99)

## 2020-09-19 NOTE — Progress Notes (Signed)
PROGRESS NOTE    VERDELLA [name]  EUM:[id number] DOB: 1955-04-26 DOA: 09/07/2020 PCP: Lavone Orn, MD   Chief Complaint  Patient presents with   Abnormal Lab  Brief Narrative: 65 year old female with history of CKD stage III, type 2 diabetes chronic diastolic CHF, OSA on CPAP admitted with melena by home health, upon presentation in the ED had nausea and showed AKI -patient was admitted, managed with IV fluid hydration.  Symptomatic management.  She had intractable nausea vomiting that resolved, renal function more or less stable.  Found to be very weak 2 ASSIST and since not much help at home . Toc involved, looking into multiple facilities. [facility]  Subjective: Reports multiple episodes of watery stool no abdominal pain nausea vomiting no fever-was taking stool softener for constipation prior to having diarrhea.  Assessment & Plan:  Intractable nausea and vomiting: Resolved.  Tolerating diet.    AKI on CKD stage IIIb:on admission 2.7 Suspect prerenal due to dehydration with poor oral intake.creatinine stable and improved .monitor intermittently encourage oral hydration.  At baseline.  Encourage oral hydration.  Recent Labs  Lab 09/13/20 0549 09/14/20 0129 09/16/20 0902  BUN 31* 28* 20  CREATININE 1.38* 1.57* 1.36*  Hypokalemia: Treated.   Recent Labs  Lab 09/13/20 0549 09/14/20 0129 09/16/20 0902  K 3.5 3.9 3.4*  History of upper GI bleeding duodenal ulcer on last endoscopy 11/15/2019:Continue PPI avoid NSAIDs  Diarrhea, following constipation after being on stool softener.  No fever.  Check CBC/bmp.  Supportive care.  Pruritus: Improved after increasing Atarax Atarax to 25 mg prn, on hydrocortisone lotion.  Type 2 diabetes mellitus with stage 3 chronic kidney disease :Hba1c 6.0 on 7/19, blood sugar well controlled.continue sliding scale insulin Recent Labs  Lab 09/18/20 0633 09/18/20 1208 09/18/20 1701 09/18/20 2027 09/19/20 0650  GLUCAP 107* 109* 137*  120* 123*  VQM:GQQP lipitor  Morbid obesity with BMI 51:Will benefit with weight loss, healthy lifestyle.  Chronic diastolic CHF:EF 61-95%, compensated.  Filed Weights   09/08/20 1622  Weight: 127.9 kg   OSA on CPAP-cont same  Functional quadriplegia Deconditioning/debility  continue PT OT  Increased lipase likely from dehydration no abdominal pain  Anxiety issues/endorses depression: cont prn Atarax, agrees to be on Lexapro.  Complains of eye soreness/suspect conjunctivitis-continue tobramycin eyedrop, and NSAIDs improved EYE ITCHING  Diet Order             Diet Carb Modified Fluid consistency: Thin; Room service appropriate? Yes  Diet effective now                   Nutrition Problem: Inadequate oral intake Etiology: nausea, poor appetite Signs/Symptoms: percent weight loss, meal completion < 25%, per patient/family report Percent weight loss: 12.2 % Interventions: Boost Breeze, MVI, Prostat Patient's Body mass index is 51.57 kg/m.  DVT prophylaxis: lovenox Code Status:   Code Status: Full Code  Family Communication: plan of care discussed with patient at bedside. Status is: Inpatient Remains inpatient appropriate because:Unsafe d/c plan Dispo: Patient From: Home  Planned Disposition: [place]  Medically stable for discharge: No   Unresulted Labs (From admission, onward)     Start     Ordered   09/19/20 0952  CBC  ONCE - STAT,   STAT       Question:  Specimen collection method  Answer:  Lab=Lab collect   09/19/20 0952   09/19/20 0932  Basic metabolic panel  ONCE - STAT,   STAT  Question:  Specimen collection method  Answer:  Lab=Lab collect   09/19/20 6333            Medications reviewed:  Scheduled Meds:  (feeding supplement) PROSource Plus  30 mL Oral TID BM   atorvastatin  10 mg Oral Daily   enoxaparin (LOVENOX) injection  60 mg Subcutaneous Q24H   escitalopram  5 mg Oral Daily   hydrocortisone cream   Topical TID    insulin aspart  0-9 Units Subcutaneous TID WC   midodrine  15 mg Oral TID WC   mirtazapine  7.5 mg Oral QHS   multivitamin with minerals  1 tablet Oral Daily   pantoprazole (PROTONIX) IV  40 mg Intravenous Q24H   sucralfate  1 g Oral TID WC & HS   tobramycin  2 drop Both Eyes Q6H   Continuous Infusions: Consultants:see note  Procedures:see note Antimicrobials: Anti-infectives (From admission, onward)    None      Culture/Microbiology    Component Value Date/Time   SDES  07/27/2020 1640    URINE, RANDOM Performed at [facility], [place] [address] [place], [place] [address]    SPECREQUEST  07/27/2020 1640    NONE Performed at [facility], [place] [address], [place], [place] [address]    CULT 50,000 COLONIES/mL KLEBSIELLA PNEUMONIAE (A) 07/27/2020 1640   REPTSTATUS 07/30/2020 FINAL 07/27/2020 1640    Other culture-see note  Objective: Vitals: Today's Vitals   09/18/20 2115 09/19/20 0740 09/19/20 0741 09/19/20 0800  BP:  111/74 111/74   Pulse:  93 93   Resp:  18 18   Temp:  97.8 F (36.6 C) 97.8 F (36.6 C)   TempSrc:  Oral Oral   SpO2:  100% 100%   Weight:      PainSc: 0-No pain   0-No pain   No intake or output data in the 24 hours ending 09/19/20 1052 Filed Weights   09/08/20 1622  Weight: 127.9 kg   Weight change:   Intake/Output from previous day: 07/29 0701 - 07/30 0700 In: -  Out: 700 [Urine:700] Intake/Output this shift: No intake/output data recorded. Filed Weights   09/08/20 1622  Weight: 127.9 kg   Examination: General exam: AAOx 3, obese, older than stated age, weak appearing. HEENT:Oral mucosa moist, Ear/Nose WNL grossly, dentition normal. Respiratory system: bilaterally diminished, , no use of accessory muscle Cardiovascular system: S1 & S2 +, No JVD,. Gastrointestinal system: Abdomen soft, NT,ND, BS+ Nervous System:Alert, awake, moving extremities and grossly nonfocal Extremities: no edema, distal  peripheral pulses palpable.  Skin: No rashes,no icterus. MSK: Normal muscle bulk,tone, power    Data Reviewed: I have personally reviewed following labs and imaging studies CBC: No results for input(s): WBC, NEUTROABS, HGB, HCT, MCV, PLT in the last 168 hours.  Basic Metabolic Panel: Recent Labs  Lab 09/13/20 0549 09/14/20 0129 09/16/20 0902  NA 140 140 139  K 3.5 3.9 3.4*  CL 106 105 103  CO2 24 26 28   GLUCOSE 100* 119* 127*  BUN 31* 28* 20  CREATININE 1.38* 1.57* 1.36*  CALCIUM 9.2 9.2 8.7*   GFR: Estimated Creatinine Clearance: 51.5 mL/min (A) (by C-G formula based on SCr of 1.36 mg/dL (H)). Liver Function Tests: No results for input(s): AST, ALT, ALKPHOS, BILITOT, PROT, ALBUMIN in the last 168 hours.  No results for input(s): LIPASE, AMYLASE in the last 168 hours.  No results for input(s): AMMONIA in the last 168 hours. Coagulation Profile: No results for input(s):  INR, PROTIME in the last 168 hours. Cardiac Enzymes: No results for input(s): CKTOTAL, CKMB, CKMBINDEX, TROPONINI in the last 168 hours.  BNP (last 3 results) No results for input(s): PROBNP in the last 8760 hours. HbA1C: No results for input(s): HGBA1C in the last 72 hours. CBG: Recent Labs  Lab 09/18/20 0633 09/18/20 1208 09/18/20 1701 09/18/20 2027 09/19/20 0650  GLUCAP 107* 109* 137* 120* 123*   Lipid Profile: No results for input(s): CHOL, HDL, LDLCALC, TRIG, CHOLHDL, LDLDIRECT in the last 72 hours. Thyroid Function Tests: No results for input(s): TSH, T4TOTAL, FREET4, T3FREE, THYROIDAB in the last 72 hours. Anemia Panel: No results for input(s): VITAMINB12, FOLATE, FERRITIN, TIBC, IRON, RETICCTPCT in the last 72 hours. Sepsis Labs: No results for input(s): PROCALCITON, LATICACIDVEN in the last 168 hours.   No results found for this or any previous visit (from the past 240 hour(s)).     Radiology Studies: No results found.   LOS: 11 days   Antonieta Pert, MD [facility]  09/19/2020, 10:52 AM

## 2020-09-19 NOTE — Plan of Care (Signed)
  Problem: Activity: Goal: Risk for activity intolerance will decrease Outcome: Progressing   Problem: Pain Managment: Goal: General experience of comfort will improve Outcome: Progressing   Problem: Safety: Goal: Ability to remain free from injury will improve Outcome: Progressing   

## 2020-09-19 NOTE — Plan of Care (Signed)
  Problem: Pain Managment: Goal: General experience of comfort will improve Outcome: Progressing   Problem: Safety: Goal: Ability to remain free from injury will improve Outcome: Progressing   Problem: Skin Integrity: Goal: Risk for impaired skin integrity will decrease Outcome: Progressing   

## 2020-09-20 LAB — GLUCOSE, CAPILLARY
Glucose-Capillary: 106 mg/dL — ABNORMAL HIGH (ref 70–99)
Glucose-Capillary: 156 mg/dL — ABNORMAL HIGH (ref 70–99)
Glucose-Capillary: 125 mg/dL — ABNORMAL HIGH (ref 70–99)
Glucose-Capillary: 127 mg/dL — ABNORMAL HIGH (ref 70–99)

## 2020-09-20 MED ORDER — LOPERAMIDE HCL 2 MG PO CAPS
2.0000 mg | ORAL_CAPSULE | ORAL | Status: AC | PRN
  Administered 2020-09-20: 2 mg via ORAL
  Filled 2020-09-20: qty 1

## 2020-09-20 NOTE — Progress Notes (Signed)
PROGRESS NOTE    [name] [name]  EUM:[id number] DOB: Jan 08, 1956 DOA: 09/07/2020 PCP: Lavone Orn, MD   Chief Complaint  Patient presents with   Abnormal Lab  Brief Narrative: 65 year old female with history of CKD stage III, type 2 diabetes chronic diastolic CHF, OSA on CPAP admitted with melena by home health, upon presentation in the ED had nausea and showed AKI -patient was admitted, managed with IV fluid hydration.  Symptomatic management.  She had intractable nausea vomiting that resolved, renal function more or less stable.  Found to be very weak 2 ASSIST and since not much help at home . Toc involved, looking [facility] multiple facilities. [facility]  Subjective: Seen this morning.  Patient reports she feels much better today diarrhea has improved. Tolerating diet Assessment & Plan:  Intractable nausea and vomiting: Resolved.  Tolerating diet.    AKI on CKD stage IIIb:on admission 2.7 Suspect prerenal due to dehydration with poor oral intake.creatinine stable and improved .monitor intermittently encourage oral hydration.  At baseline.  Encourage oral hydration.  Recent Labs  Lab 09/14/20 0129 09/16/20 0902 09/19/20 1628  BUN 28* 20 18  CREATININE 1.57* 1.36* 1.41*   Hypokalemia: Treated.   Recent Labs  Lab 09/14/20 0129 09/16/20 0902 09/19/20 1628  K 3.9 3.4* 3.9   History of upper GI bleeding duodenal ulcer on last endoscopy 11/15/2019:Continue PPI avoid NSAIDs  Diarrhea, following constipation after being on stool softener.  No fever.  No leukocytosis no abdominal tenderness.  Off stool softener, on as needed Imodium.   Pruritus: Improved after increasing Atarax to 25 mg prn, on hydrocortisone lotion.  Type 2 diabetes mellitus with stage 3 chronic kidney disease :Hba1c 6.0 on 7/19, blood sugar well controlled.continue sliding scale insulin Recent Labs  Lab 09/19/20 0650 09/19/20 1226 09/19/20 1722 09/19/20 2029 09/20/20 0641  GLUCAP 123* 146* 121* 122*  106*   VQM:GQQP lipitor  Morbid obesity with BMI 51:Will benefit with weight loss, healthy lifestyle.  Chronic diastolic CHF:EF 61-95%, compensated.  Filed Weights   09/08/20 1622  Weight: 127.9 kg   OSA on CPAP-cont same  Functional quadriplegia Deconditioning/debility  continue PT OT  Increased lipase likely from dehydration no abdominal pain  Anxiety issues/endorses depression: cont prn Atarax, agrees to be on Lexapro.  Complains of eye soreness/suspect conjunctivitis-continue tobramycin eyedrop, and NSAIDs improved EYE ITCHING  Diet Order             Diet Carb Modified Fluid consistency: Thin; Room service appropriate? Yes  Diet effective now                   Nutrition Problem: Inadequate oral intake Etiology: nausea, poor appetite Signs/Symptoms: percent weight loss, meal completion < 25%, per patient/family report Percent weight loss: 12.2 % Interventions: Boost Breeze, MVI, Prostat Patient's Body mass index is 51.57 kg/m.  DVT prophylaxis: lovenox Code Status:   Code Status: Full Code  Family Communication: plan of care discussed with patient at bedside. Status is: Inpatient Remains inpatient appropriate because:Unsafe d/c plan Dispo: Patient From: Home  Planned Disposition: [place]  Medically stable for discharge: No   Unresulted Labs (From admission, onward)    None       Medications reviewed:  Scheduled Meds:  (feeding supplement) PROSource Plus  30 mL Oral TID BM   atorvastatin  10 mg Oral Daily   enoxaparin (LOVENOX) injection  60 mg Subcutaneous Q24H   escitalopram  5 mg Oral Daily   hydrocortisone cream  Topical TID   insulin aspart  0-9 Units Subcutaneous TID WC   midodrine  15 mg Oral TID WC   mirtazapine  7.5 mg Oral QHS   multivitamin with minerals  1 tablet Oral Daily   pantoprazole (PROTONIX) IV  40 mg Intravenous Q24H   sucralfate  1 g Oral TID WC & HS   tobramycin  2 drop Both Eyes Q6H   Continuous  Infusions: Consultants:see note  Procedures:see note Antimicrobials: Anti-infectives (From admission, onward)    None      Culture/Microbiology    Component Value Date/Time   SDES  07/27/2020 1640    URINE, RANDOM Performed at [facility], [place] [address] [place], [place] [address]    SPECREQUEST  07/27/2020 1640    NONE Performed at [facility], [place] [address] [place], [place] [address]    CULT 50,000 COLONIES/mL KLEBSIELLA PNEUMONIAE (A) 07/27/2020 1640   REPTSTATUS 07/30/2020 FINAL 07/27/2020 1640    Other culture-see note  Objective: Vitals: Today's Vitals   09/19/20 0800 09/19/20 1229 09/19/20 2000 09/19/20 2100  BP:  (!) 127/52  (!) 117/45  Pulse:  89  97  Resp:  18  17  Temp:  98 F (36.7 C)  98.3 F (36.8 C)  TempSrc:    Oral  SpO2:  100%  99%  Weight:      PainSc: 0-No pain  0-No pain    No intake or output data in the 24 hours ending 09/20/20 0652 Filed Weights   09/08/20 1622  Weight: 127.9 kg   Weight change:   Intake/Output from previous day: No intake/output data recorded. Intake/Output this shift: No intake/output data recorded. Filed Weights   09/08/20 1622  Weight: 127.9 kg   Examination: General exam: AAOx 3, morbidly obese, older than stated age, weak appearing. HEENT:Oral mucosa moist, Ear/Nose WNL grossly, dentition normal. Respiratory system: bilaterally diminished, no use of accessory muscle Cardiovascular system: S1 & S2 +, No JVD,. Gastrointestinal system: Abdomen soft, NT,ND, BS+ Nervous System:Alert, awake, moving extremities and grossly nonfocal Extremities: no edema, distal peripheral pulses palpable.  Skin: No rashes,no icterus. MSK: Normal muscle bulk,tone, power    Data Reviewed: I have personally reviewed following labs and imaging studies CBC: Recent Labs  Lab 09/19/20 1628  WBC 5.1  HGB 10.8*  HCT 34.8*  MCV 89.2  PLT 540    Basic Metabolic Panel: Recent Labs   Lab 09/14/20 0129 09/16/20 0902 09/19/20 1628  NA 140 139 139  K 3.9 3.4* 3.9  CL 105 103 103  CO2 26 28 25   GLUCOSE 119* 127* 124*  BUN 28* 20 18  CREATININE 1.57* 1.36* 1.41*  CALCIUM 9.2 8.7* 8.1*    GFR: Estimated Creatinine Clearance: 49.6 mL/min (A) (by C-G formula based on SCr of 1.41 mg/dL (H)). Liver Function Tests: No results for input(s): AST, ALT, ALKPHOS, BILITOT, PROT, ALBUMIN in the last 168 hours.  No results for input(s): LIPASE, AMYLASE in the last 168 hours.  No results for input(s): AMMONIA in the last 168 hours. Coagulation Profile: No results for input(s): INR, PROTIME in the last 168 hours. Cardiac Enzymes: No results for input(s): CKTOTAL, CKMB, CKMBINDEX, TROPONINI in the last 168 hours.  BNP (last 3 results) No results for input(s): PROBNP in the last 8760 hours. HbA1C: No results for input(s): HGBA1C in the last 72 hours. CBG: Recent Labs  Lab 09/19/20 0650 09/19/20 1226 09/19/20 1722 09/19/20 2029 09/20/20 0641  GLUCAP 123* 146* 121* 122*  106*    Lipid Profile: No results for input(s): CHOL, HDL, LDLCALC, TRIG, CHOLHDL, LDLDIRECT in the last 72 hours. Thyroid Function Tests: No results for input(s): TSH, T4TOTAL, FREET4, T3FREE, THYROIDAB in the last 72 hours. Anemia Panel: No results for input(s): VITAMINB12, FOLATE, FERRITIN, TIBC, IRON, RETICCTPCT in the last 72 hours. Sepsis Labs: No results for input(s): PROCALCITON, LATICACIDVEN in the last 168 hours.   No results found for this or any previous visit (from the past 240 hour(s)).     Radiology Studies: No results found.   LOS: 12 days   Antonieta Pert, MD [facility]  09/20/2020, 6:52 AM

## 2020-09-20 NOTE — Plan of Care (Signed)
  Problem: Pain Managment: Goal: General experience of comfort will improve Outcome: Progressing   Problem: Safety: Goal: Ability to remain free from injury will improve Outcome: Progressing   Problem: Skin Integrity: Goal: Risk for impaired skin integrity will decrease Outcome: Progressing   

## 2020-09-21 ENCOUNTER — Encounter (HOSPITAL_COMMUNITY): Payer: Self-pay | Admitting: Internal Medicine

## 2020-09-21 LAB — GLUCOSE, CAPILLARY
Glucose-Capillary: 109 mg/dL — ABNORMAL HIGH (ref 70–99)
Glucose-Capillary: 115 mg/dL — ABNORMAL HIGH (ref 70–99)
Glucose-Capillary: 136 mg/dL — ABNORMAL HIGH (ref 70–99)
Glucose-Capillary: 186 mg/dL — ABNORMAL HIGH (ref 70–99)
Glucose-Capillary: 144 mg/dL — ABNORMAL HIGH (ref 70–99)

## 2020-09-21 NOTE — TOC Progression Note (Signed)
Transition of Care [facility]) - Progression Note    Patient Details  Name: [name] [name] MRN: [id number] Date of Birth: 05-Feb-1956  Transition of Care [facility]) CM/SW Contact  Milinda Antis, [place] Phone Number: 09/21/2020, 4:00 PM  Clinical Narrative:     16:00-  CSW contacted Ludger Nutting / [facility] [place] [phone]) to inquire about their ability to accept the patient.  There was no answer.  CSW left a VM requesting a returned call.    16:11-  CSW attempted to contact Randon Goldsmith with [facility] [phone]).  There was no answer. CSW left a VM requesting a returned call.  16:18-  CSW attempted to contact [place] [phone]).  There was no answer.  CSW left a VM requesting a returned call.  16:20-  CSW attempted to contact [facility] [phone])  There was no answer.  CSW left a VM requesting a returned call.  16:25- CSW attempted to contact [place] at the facilities phone number [phone]) and the admissions cell number [phone]).  There was no answer.  CSW left a VM requesting a returned call.    16:26-  CSW contacted Driscilla Grammes at [phone].  CSW spoke with Marita [place] in admissions who informed CSW that the facility currently does not have any LTC beds available.  -Three of the eight facilities contracted with the [facility] report not having beds available ([facility], [facility], and [facility]).  CSW is awaiting responses from the other five.  Pending: LTC facility bed offer.    Expected Discharge Plan: Long Term Nursing Home Barriers to Discharge: Other (must enter comment) (no LT/SNF bed offer)  Expected Discharge Plan and Services Expected Discharge Plan: [place]   Discharge Planning Services: CM Consult   Living arrangements for the past 2 months: Single Family Home                           HH Arranged: RN, PT (Active with [place]) [address]: [place]  ([place]) Date [place]: 09/08/20 Time [place]: 1036 Representative spoke with at [place]: [place] ([place]) Interventions    Readmission Risk Interventions Readmission Risk Prevention Plan 02/06/2020 11/08/2019  Transportation Screening Complete Complete  Medication Review [employer]) - Referral to Pharmacy  PCP or Specialist appointment within 3-5 days of discharge Complete Complete  HRI or [place] - Complete  SW Recovery Care/Counseling Consult Complete Complete  Palliative Care Screening Not Applicable Not Applicable  Skilled Nursing Facility Complete Complete  Some recent data might be hidden

## 2020-09-21 NOTE — Progress Notes (Signed)
Pt requested a bath. I told her I could get the nurse tech to come help with a bath. She said she did not want a female to bathe her. I told her I could get a female tech to come help her with a bath and she refused. I even offered to help get her set up for a bed bath and she try to bathe herself. She refused again saying not to worry about it.

## 2020-09-21 NOTE — Progress Notes (Signed)
PROGRESS NOTE    [name] [name]  XBM:[id number] DOB: Jan 24, 1956 DOA: 09/07/2020 PCP: Lavone Orn, MD   Chief Complaint  Patient presents with   Abnormal Lab  Brief Narrative: 65 year old female with history of CKD stage III, type 2 diabetes chronic diastolic CHF, OSA on CPAP admitted with melena by home health, upon presentation in the ED had nausea and showed AKI -patient was admitted, managed with IV fluid hydration.  Symptomatic management.  She had intractable nausea vomiting that resolved, renal function more or less stable.  Found to be very weak 2 ASSIST and since not much help at home . Toc involved, looking into multiple facilities. [facility]  Subjective: Afebrile overnight. No acute events. She is waiting on placement. Reports some diarrhea.  Assessment & Plan:  Intractable nausea and vomiting: Resolved.  Tolerating diet.    AKI on CKD stage IIIb:on admission 2.7 Suspect prerenal due to dehydration with poor oral intake.creatinine stable and improved .monitor intermittently encourage oral hydration.  At baseline.  Encourage oral hydration.  Recent Labs  Lab 09/16/20 0902 09/19/20 1628  BUN 20 18  CREATININE 1.36* 1.41*  Hypokalemia: Treated.   Recent Labs  Lab 09/16/20 0902 09/19/20 1628  K 3.4* 3.9  History of upper GI bleeding duodenal ulcer on last endoscopy 11/15/2019:Continue PPI avoid NSAIDs  Diarrhea, following constipation after being on stool softener.  No fever.  No leukocytosis no abdominal pain.  Continue Imodium discontinued stool softener.   Pruritus: Improved after increasing Atarax to 25 mg prn, on hydrocortisone lotion.  Type 2 diabetes mellitus with stage 3 chronic kidney disease :Hba1c 6.0 on 7/19, blood sugar well controlled.continue sliding scale insulin Recent Labs  Lab 09/20/20 1245 09/20/20 1659 09/20/20 1943 09/21/20 0420 09/21/20 0817  GLUCAP 156* 125* 127* 109* 115*  UUV:OZDG lipitor  Morbid obesity with BMI 51:Will  benefit with weight loss, healthy lifestyle.  Chronic diastolic CHF:EF 64-40%, compensated.  Filed Weights   09/08/20 1622  Weight: 127.9 kg   OSA on CPAP-cont same  Functional quadriplegia Deconditioning/debility  continue PT OT  Increased lipase likely from dehydration no abdominal pain  Anxiety issues/endorses depression: cont prn Atarax, agrees to be on Lexapro.  Complains of eye soreness/suspect conjunctivitis-continue tobramycin eyedrop, and NSAIDs improved EYE ITCHING  Diet Order             Diet Carb Modified Fluid consistency: Thin; Room service appropriate? Yes  Diet effective now                   Nutrition Problem: Inadequate oral intake Etiology: nausea, poor appetite Signs/Symptoms: percent weight loss, meal completion < 25%, per patient/family report Percent weight loss: 12.2 % Interventions: Boost Breeze, MVI, Prostat Patient's Body mass index is 51.57 kg/m.  DVT prophylaxis: lovenox Code Status:   Code Status: Full Code  Family Communication: plan of care discussed with patient at bedside. Status is: Inpatient Remains inpatient appropriate because:Unsafe d/c plan Dispo: Patient From: Home  Planned Disposition: [place]  Medically stable for discharge: yes   Unresulted Labs (From admission, onward)    None       Medications reviewed:  Scheduled Meds:  (feeding supplement) PROSource Plus  30 mL Oral TID BM   atorvastatin  10 mg Oral Daily   enoxaparin (LOVENOX) injection  60 mg Subcutaneous Q24H   escitalopram  5 mg Oral Daily   hydrocortisone cream   Topical TID   insulin aspart  0-9 Units Subcutaneous TID WC  midodrine  15 mg Oral TID WC   mirtazapine  7.5 mg Oral QHS   multivitamin with minerals  1 tablet Oral Daily   pantoprazole (PROTONIX) IV  40 mg Intravenous Q24H   sucralfate  1 g Oral TID WC & HS   Continuous Infusions: Consultants:see note  Procedures:see note Antimicrobials: Anti-infectives (From  admission, onward)    None      Culture/Microbiology    Component Value Date/Time   SDES  07/27/2020 1640    URINE, RANDOM Performed at [facility], [place] [address] [place], [place] [address]    SPECREQUEST  07/27/2020 1640    NONE Performed at [facility], [place] [address], [place], [place] [address]    CULT 50,000 COLONIES/mL KLEBSIELLA PNEUMONIAE (A) 07/27/2020 1640   REPTSTATUS 07/30/2020 FINAL 07/27/2020 1640    Other culture-see note  Objective: Vitals: Today's Vitals   09/20/20 2022 09/21/20 0700 09/21/20 0814 09/21/20 0952  BP: (!) 129/49  (!) 119/56   Pulse: 75  95   Resp: 16  18   Temp: 98.3 F (36.8 C)  98 F (36.7 C)   TempSrc: Oral Oral Oral   SpO2: 100%  100%   Weight:      Height:  5\' 2"  (1.575 m)    PainSc:    0-No pain    Intake/Output Summary (Last 24 hours) at 09/21/2020 1154 Last data filed at 09/21/2020 0935 Gross per 24 hour  Intake 240 ml  Output 200 ml  Net 40 ml   Filed Weights   09/08/20 1622  Weight: 127.9 kg   Weight change:   Intake/Output from previous day: 07/31 0701 - 08/01 0700 In: 240 [P.O.:240] Out: 200 [Urine:200] Intake/Output this shift: Total I/O In: 240 [P.O.:240] Out: -  Filed Weights   09/08/20 1622  Weight: 127.9 kg   Examination: General exam: AAOx 3, obese, pleasant, comfortable older than stated age, weak appearing. HEENT:Oral mucosa moist, Ear/Nose WNL grossly, dentition normal. Respiratory system: bilaterally diminished, no use of accessory muscle Cardiovascular system: S1 & S2 +, No JVD,. Gastrointestinal system: Abdomen soft,NT,ND, BS+ Nervous System:Alert, awake, moving extremities and grossly nonfocal Extremities: No edema, distal peripheral pulses palpable.  Skin: No rashes,no icterus. MSK: Normal muscle bulk,tone, power    Data Reviewed: I have personally reviewed following labs and imaging studies CBC: Recent Labs  Lab 09/19/20 1628  WBC 5.1  HGB  10.8*  HCT 34.8*  MCV 89.2  PLT 355    Basic Metabolic Panel: Recent Labs  Lab 09/16/20 0902 09/19/20 1628  NA 139 139  K 3.4* 3.9  CL 103 103  CO2 28 25  GLUCOSE 127* 124*  BUN 20 18  CREATININE 1.36* 1.41*  CALCIUM 8.7* 8.1*   GFR: Estimated Creatinine Clearance: 49.6 mL/min (A) (by C-G formula based on SCr of 1.41 mg/dL (H)). Liver Function Tests: No results for input(s): AST, ALT, ALKPHOS, BILITOT, PROT, ALBUMIN in the last 168 hours.  No results for input(s): LIPASE, AMYLASE in the last 168 hours.  No results for input(s): AMMONIA in the last 168 hours. Coagulation Profile: No results for input(s): INR, PROTIME in the last 168 hours. Cardiac Enzymes: No results for input(s): CKTOTAL, CKMB, CKMBINDEX, TROPONINI in the last 168 hours.  BNP (last 3 results) No results for input(s): PROBNP in the last 8760 hours. HbA1C: No results for input(s): HGBA1C in the last 72 hours. CBG: Recent Labs  Lab 09/20/20 1245 09/20/20 1659 09/20/20 1943 09/21/20 0420 09/21/20 7322  GLUCAP 156* 125* 127* 109* 115*   Lipid Profile: No results for input(s): CHOL, HDL, LDLCALC, TRIG, CHOLHDL, LDLDIRECT in the last 72 hours. Thyroid Function Tests: No results for input(s): TSH, T4TOTAL, FREET4, T3FREE, THYROIDAB in the last 72 hours. Anemia Panel: No results for input(s): VITAMINB12, FOLATE, FERRITIN, TIBC, IRON, RETICCTPCT in the last 72 hours. Sepsis Labs: No results for input(s): PROCALCITON, LATICACIDVEN in the last 168 hours.   No results found for this or any previous visit (from the past 240 hour(s)).     Radiology Studies: No results found.   LOS: 13 days   Antonieta Pert, MD [facility]  09/21/2020, 11:54 AM

## 2020-09-22 LAB — GLUCOSE, CAPILLARY
Glucose-Capillary: 101 mg/dL — ABNORMAL HIGH (ref 70–99)
Glucose-Capillary: 135 mg/dL — ABNORMAL HIGH (ref 70–99)
Glucose-Capillary: 163 mg/dL — ABNORMAL HIGH (ref 70–99)
Glucose-Capillary: 153 mg/dL — ABNORMAL HIGH (ref 70–99)

## 2020-09-22 MED ORDER — DIPHENHYDRAMINE HCL 25 MG PO CAPS: 25.0000 mg | ORAL_CAPSULE | Freq: Four times a day (QID) | ORAL | Status: DC | PRN

## 2020-09-22 MED ORDER — PANTOPRAZOLE SODIUM 40 MG PO TBEC
40.0000 mg | DELAYED_RELEASE_TABLET | Freq: Every day | ORAL | Status: DC
  Administered 2020-09-23 – 2020-09-24 (×2): 40 mg via ORAL
  Filled 2020-09-22 (×3): qty 1

## 2020-09-22 NOTE — Progress Notes (Signed)
Patient refused morning dressing change.

## 2020-09-22 NOTE — Progress Notes (Signed)
Nutrition Follow-up  DOCUMENTATION CODES:   Morbid obesity  INTERVENTION:   -Continue 30 ml Prosource Plus TID with meals, each supplement provides 100 kcals and 15 grams protein -Continue MVI with minerals daily -Continue Magic cup TID with meals, each supplement provides 290 kcal and 9 grams of protein   NUTRITION DIAGNOSIS:   Inadequate oral intake related to nausea, poor appetite as evidenced by percent weight loss, meal completion < 25%, per patient/family report.  Ongoing  GOAL:   Patient will meet greater than or equal to 90% of their needs  Progressing   MONITOR:   PO intake, Supplement acceptance, Diet advancement, Labs, Weight trends, Skin, I & O's  REASON FOR ASSESSMENT:   Consult Assessment of nutrition requirement/status  ASSESSMENT:   [name] [name] is a 65 y.o. female with medical history significant of      CKD 3, DM 2, Chronic diastolic CHF, OSA on CPAP  Reviewed I/O's: +240 ml x 24 hours and +2.6 L since admission   Pt unavailable at time of visit.   Pt remains with good appetite. Noted meal completions 80-100%.  Per TOC notes, pt awaiting SNF placement.    Medications reviewed and include remeron.  Labs reviewed: CBGS: 101-186 (inpatient orders for glycemic control are 0-9 units insulin aspart TID).    Diet Order:   Diet Order             Diet Carb Modified Fluid consistency: Thin; Room service appropriate? Yes  Diet effective now                   EDUCATION NEEDS:   Education needs have been addressed  Skin:  Skin Assessment: Skin Integrity Issues: Skin Integrity Issues:: Other (Comment) Other: non-pressure wound to lt flank, dehisced abdominal incision  Last BM:  09/20/20  Height:   Ht Readings from Last 1 Encounters:  09/21/20 5\' 2"  (1.575 m)    Weight:   Wt Readings from Last 1 Encounters:  09/08/20 127.9 kg    Ideal Body Weight:  50 kg  BMI:  Body mass index is 51.57 kg/m.  Estimated Nutritional Needs:    Kcal:  1800-2000  Protein:  110-125 grams  Fluid:  > 1.8 L    Loistine Chance, RD, LDN, [place] Registered Dietitian II Certified Diabetes Care and Education Specialist Please refer to [facility] for RD and/or RD on-call/weekend/after hours pager

## 2020-09-22 NOTE — TOC Progression Note (Addendum)
Transition of Care [facility]) - Progression Note    Patient Details  Name: [name] [name] MRN: [id number] Date of Birth: Aug 09, 1955  [facility]) CM/SW Contact  Milinda Antis, [place] Phone Number: 09/22/2020, 8:49 AM  Clinical Narrative:     08:27- CSW received a response from Randon Goldsmith with [facility] [phone]).  The agency is unable to accept the patient due to not having VA beds available.    10:16-  CSW contacted [place] [facility] [phone]) to inquire about their ability to accept the patient.  Tanika informed CSW that the referral has been sent to the [facility] for review and she is awaiting a decision.     10:31-CSW attempted to contact [facility] [phone])  There was no answer.  The message box was full and CSW was unable to leave a message.   10:33- CSW contacted [facility] [place] on the admissions cell number [phone]).  CSW spoke with Belenda Cruise and was informed that they do not have any long term beds available.      15:03-  CSW received a returned call from [place] [facility] [phone]).  The facility is unable to extend a bed offer.    16:03- CSW attempted to contact [facility] [phone])  CSW spoke with the receptionist and informed the receptionist that the voice mailbox for admissions was full.  CSW was informed that the person whose mailbox is full is the only person at the facility who can answer admissions questions and that person has been out of the office and is "trying to catch up".  There was no other person for CSW to speak with about referral.     -Seven of the eight facilities contracted with the [facility] report not having beds available ([facility], [facility], [facility] [place], [facility] [place], [facility] [place]).    CSW is awaiting responses from the only agency left, [facility].    Pending: LTC facility bed offer.    Expected Discharge Plan: Long Term Nursing Home Barriers to Discharge: Other (must enter comment) (no LT/SNF bed offer)  Expected Discharge Plan and Services Expected Discharge Plan: [place]   Discharge Planning Services: CM Consult   Living arrangements for the past 2 months: Single Family Home                           HH Arranged: RN, PT (Active with [place]) [place]: [place] ([place]) Date [place]: 09/08/20 Time [place]: 1036 Representative spoke with at [place]: [place] ([place]) Interventions    Readmission Risk Interventions Readmission Risk Prevention Plan 02/06/2020 11/08/2019  Transportation Screening Complete Complete  Medication Review Press photographer) - Referral to Pharmacy  PCP or Specialist appointment within 3-5 days of discharge Complete Complete  HRI or [place] - Complete  SW Recovery Care/Counseling Consult Complete Complete  Palliative Care Screening Not Applicable Not Applicable  Skilled Nursing Facility Complete Complete  Some recent data might be hidden

## 2020-09-22 NOTE — Plan of Care (Signed)

## 2020-09-22 NOTE — Progress Notes (Signed)
PROGRESS NOTE    [name] [name]  OEV:[id number] DOB: 11/14/1955 DOA: 09/07/2020 PCP: Lavone Orn, MD   Chief Complaint  Patient presents with   Abnormal Lab  Brief Narrative: 65 year old female with history of CKD stage III, type 2 diabetes chronic diastolic CHF, OSA on CPAP admitted with melena by home health, upon presentation in the ED had nausea and showed AKI -patient was admitted, managed with IV fluid hydration.  Symptomatic management.  She had intractable nausea vomiting that resolved, renal function more or less stable.  Found to be very weak 2 ASSIST and since not much help at home . Toc involved, looking into multiple facilities. Pending VA snf  Subjective: Seen and examined.  Patient has no new complaints.  Still waiting for placement. Assessment & Plan:  Intractable nausea and vomiting: Resolved.  Tolerating diet.    AKI on CKD stage IIIb:on admission 2.7 Suspect prerenal due to dehydration with poor oral intake.creatinine stable and improved .monitor intermittently encourage oral hydration.  At baseline.  Encourage oral hydration.  Recent Labs  Lab 09/16/20 0902 09/19/20 1628  BUN 20 18  CREATININE 1.36* 1.41*  Hypokalemia: Treated.   Recent Labs  Lab 09/16/20 0902 09/19/20 1628  K 3.4* 3.9  History of upper GI bleeding duodenal ulcer on last endoscopy 11/15/2019:Continue PPI avoid NSAIDs  Diarrhea, following constipation after being on stool softener.  No fever.  No leukocytosis no abdominal pain.  She reports feeling much better.Continue as needed Imodium.  Stool softener discontinued.  Pruritus: Improved after increasing Atarax to 25 mg prn, on hydrocortisone lotion.  Type 2 diabetes mellitus with stage 3 chronic kidney disease :Hba1c 6.0 on 7/19, blood sugar well controlled.continue sliding scale insulin Recent Labs  Lab 09/21/20 0817 09/21/20 1159 09/21/20 1606 09/21/20 2103 09/22/20 0647  GLUCAP 115* 136* 186* 144* 101*  WEX:HBZJ  lipitor  Morbid obesity with BMI 51:Will benefit with weight loss, healthy lifestyle.  Chronic diastolic CHF:EF 69-67%, compensated.  Filed Weights   09/08/20 1622  Weight: 127.9 kg   OSA on CPAP-cont same  Functional quadriplegia Deconditioning/debility  continue PT OT  Increased lipase likely from dehydration no abdominal pain  Anxiety issues/endorses depression: cont prn Atarax, agrees to be on Lexapro.  Complains of eye soreness/suspect conjunctivitis-continue tobramycin eyedrop, and NSAIDs improved EYE ITCHING  Diet Order             Diet Carb Modified Fluid consistency: Thin; Room service appropriate? Yes  Diet effective now                   Nutrition Problem: Inadequate oral intake Etiology: nausea, poor appetite Signs/Symptoms: percent weight loss, meal completion < 25%, per patient/family report Percent weight loss: 12.2 % Interventions: Boost Breeze, MVI, Prostat Patient's Body mass index is 51.57 kg/m.  DVT prophylaxis: lovenox Code Status:   Code Status: Full Code  Family Communication: plan of care discussed with patient at bedside. Status is: Inpatient Remains inpatient appropriate because:Unsafe d/c plan Dispo: Patient From: Home  Planned Disposition: [place]  Medically stable for discharge: yes   Unresulted Labs (From admission, onward)    None       Medications reviewed:  Scheduled Meds:  (feeding supplement) PROSource Plus  30 mL Oral TID BM   atorvastatin  10 mg Oral Daily   enoxaparin (LOVENOX) injection  60 mg Subcutaneous Q24H   escitalopram  5 mg Oral Daily   hydrocortisone cream   Topical TID   insulin aspart  0-9  Units Subcutaneous TID WC   midodrine  15 mg Oral TID WC   mirtazapine  7.5 mg Oral QHS   multivitamin with minerals  1 tablet Oral Daily   pantoprazole (PROTONIX) IV  40 mg Intravenous Q24H   sucralfate  1 g Oral TID WC & HS   Continuous Infusions: Consultants:see note  Procedures:see  note Antimicrobials: Anti-infectives (From admission, onward)    None      Culture/Microbiology    Component Value Date/Time   SDES  07/27/2020 1640    URINE, RANDOM Performed at [facility], [place] [address], [place], [place] [address]    SPECREQUEST  07/27/2020 1640    NONE Performed at [facility], [place] [address], [place], [place] [address]    CULT 50,000 COLONIES/mL KLEBSIELLA PNEUMONIAE (A) 07/27/2020 1640   REPTSTATUS 07/30/2020 FINAL 07/27/2020 1640    Other culture-see note  Objective: Vitals: Today's Vitals   09/22/20 0008 09/22/20 0053 09/22/20 0809 09/22/20 1000  BP:   (!) 125/56   Pulse:   96   Resp:   17   Temp:   98.2 F (36.8 C)   TempSrc:   Oral   SpO2:   100%   Weight:      Height:      PainSc: 7  6   0-No pain    Intake/Output Summary (Last 24 hours) at 09/22/2020 1125 Last data filed at 09/22/2020 0811 Gross per 24 hour  Intake --  Output 700 ml  Net -700 ml   Filed Weights   09/08/20 1622  Weight: 127.9 kg   Weight change:   Intake/Output from previous day: 08/01 0701 - 08/02 0700 In: 240 [P.O.:240] Out: -  Intake/Output this shift: Total I/O In: -  Out: 700 [Urine:700] Filed Weights   09/08/20 1622  Weight: 127.9 kg   Examination: General exam: AAOx3,obese. HEENT:Oral mucosa moist, Ear/Nose WNL grossly, dentition normal. Respiratory system: bilaterally diminished,no use of accessory muscle Cardiovascular system: S1 & S2 +, No JVD. Gastrointestinal system: Abdomen soft,NT,ND,BS+. Nervous System:Alert, awake, moving extremities and grossly non-focal. Extremities:no edema, distal peripheral pulses palpable.  Skin: No rashes,no icterus. MSK: Normal muscle bulk,tone, power.   Data Reviewed: I have personally reviewed following labs and imaging studies CBC: Recent Labs  Lab 09/19/20 1628  WBC 5.1  HGB 10.8*  HCT 34.8*  MCV 89.2  PLT 962    Basic Metabolic Panel: Recent Labs  Lab  09/16/20 0902 09/19/20 1628  NA 139 139  K 3.4* 3.9  CL 103 103  CO2 28 25  GLUCOSE 127* 124*  BUN 20 18  CREATININE 1.36* 1.41*  CALCIUM 8.7* 8.1*   GFR: Estimated Creatinine Clearance: 49.6 mL/min (A) (by C-G formula based on SCr of 1.41 mg/dL (H)). Liver Function Tests: No results for input(s): AST, ALT, ALKPHOS, BILITOT, PROT, ALBUMIN in the last 168 hours.  No results for input(s): LIPASE, AMYLASE in the last 168 hours.  No results for input(s): AMMONIA in the last 168 hours. Coagulation Profile: No results for input(s): INR, PROTIME in the last 168 hours. Cardiac Enzymes: No results for input(s): CKTOTAL, CKMB, CKMBINDEX, TROPONINI in the last 168 hours.  BNP (last 3 results) No results for input(s): PROBNP in the last 8760 hours. HbA1C: No results for input(s): HGBA1C in the last 72 hours. CBG: Recent Labs  Lab 09/21/20 0817 09/21/20 1159 09/21/20 1606 09/21/20 2103 09/22/20 0647  GLUCAP 115* 136* 186* 144* 101*   Lipid Profile: No results for  input(s): CHOL, HDL, LDLCALC, TRIG, CHOLHDL, LDLDIRECT in the last 72 hours. Thyroid Function Tests: No results for input(s): TSH, T4TOTAL, FREET4, T3FREE, THYROIDAB in the last 72 hours. Anemia Panel: No results for input(s): VITAMINB12, FOLATE, FERRITIN, TIBC, IRON, RETICCTPCT in the last 72 hours. Sepsis Labs: No results for input(s): PROCALCITON, LATICACIDVEN in the last 168 hours.   No results found for this or any previous visit (from the past 240 hour(s)).     Radiology Studies: No results found.   LOS: 14 days   Antonieta Pert, MD [facility]  09/22/2020, 11:25 AM

## 2020-09-22 NOTE — Plan of Care (Signed)
  Problem: Health Behavior/Discharge Planning: Goal: Ability to manage health-related needs will improve Outcome: Progressing   Problem: Clinical Measurements: Goal: Ability to maintain clinical measurements within normal limits will improve 09/22/2020 2207 by Williams Che, RN Outcome: Progressing 09/22/2020 1047 by Williams Che, RN Outcome: Progressing   Problem: Activity: Goal: Risk for activity intolerance will decrease 09/22/2020 2207 by Williams Che, RN Outcome: Progressing 09/22/2020 1047 by Williams Che, RN Outcome: Progressing   Problem: Nutrition: Goal: Adequate nutrition will be maintained 09/22/2020 2207 by Williams Che, RN Outcome: Progressing 09/22/2020 1047 by Williams Che, RN Outcome: Progressing   Problem: Coping: Goal: Level of anxiety will decrease 09/22/2020 2207 by Williams Che, RN Outcome: Progressing 09/22/2020 1047 by Williams Che, RN Outcome: Progressing   Problem: Elimination: Goal: Will not experience complications related to bowel motility 09/22/2020 2207 by Williams Che, RN Outcome: Progressing 09/22/2020 1047 by Williams Che, RN Outcome: Progressing   Problem: Pain Managment: Goal: General experience of comfort will improve 09/22/2020 2207 by Williams Che, RN Outcome: Progressing 09/22/2020 1047 by Williams Che, RN Outcome: Progressing   Problem: Safety: Goal: Ability to remain free from injury will improve 09/22/2020 2207 by Williams Che, RN Outcome: Progressing 09/22/2020 1047 by Williams Che, RN Outcome: Progressing   Problem: Skin Integrity: Goal: Risk for impaired skin integrity will decrease 09/22/2020 2207 by Williams Che, RN Outcome: Progressing 09/22/2020 1047 by Williams Che, RN Outcome: Progressing

## 2020-09-23 LAB — GLUCOSE, CAPILLARY
Glucose-Capillary: 105 mg/dL — ABNORMAL HIGH (ref 70–99)
Glucose-Capillary: 145 mg/dL — ABNORMAL HIGH (ref 70–99)
Glucose-Capillary: 143 mg/dL — ABNORMAL HIGH (ref 70–99)
Glucose-Capillary: 145 mg/dL — ABNORMAL HIGH (ref 70–99)

## 2020-09-23 NOTE — TOC Progression Note (Signed)
Transition of Care [facility] - Progression Note    Patient Details  Name: LAIA WILEY MRN: [id number] Date of Birth: 31-Mar-1955  [facility]) CM/SW Contact  Joanne Chars, [place] Phone Number: 09/23/2020, 3:27 PM  Clinical Narrative:   CSW was able to leave message with admissions at [facility] or [facility] asking about referral.     Expected Discharge Plan: Long Term Nursing Home Barriers to Discharge: Other (must enter comment) (no LT/SNF bed offer)  Expected Discharge Plan and Services Expected Discharge Plan: [place]   Discharge Planning Services: CM Consult   Living arrangements for the past 2 months: Single Family Home                           HH Arranged: RN, PT (Active with [place]) [place]: [place] ([place]) Date [place]: 09/08/20 Time [place]: 1036 Representative spoke with at [place]: [place] ([place]) Interventions    Readmission Risk Interventions Readmission Risk Prevention Plan 02/06/2020 11/08/2019  Transportation Screening Complete Complete  Medication Review Press photographer) - Referral to Pharmacy  PCP or Specialist appointment within 3-5 days of discharge Complete Complete  HRI or [place] - Complete  SW Recovery Care/Counseling Consult Complete Complete  Palliative Care Screening Not Applicable Not Applicable  Skilled Nursing Facility Complete Complete  Some recent data might be hidden

## 2020-09-23 NOTE — Progress Notes (Signed)
Seen and examined this morning. She has no new complaints. NO CHANGE IN PLAN. AWAITING ON SNF She wants to wait til Thursday to decide what she wants to do snf vs home.

## 2020-09-24 LAB — GLUCOSE, CAPILLARY
Glucose-Capillary: 125 mg/dL — ABNORMAL HIGH (ref 70–99)
Glucose-Capillary: 110 mg/dL — ABNORMAL HIGH (ref 70–99)
Glucose-Capillary: 140 mg/dL — ABNORMAL HIGH (ref 70–99)

## 2020-09-24 NOTE — Progress Notes (Signed)
PROGRESS NOTE    [name] [name]  CVE:[id number] DOB: 29-Mar-1955 DOA: 09/07/2020 PCP: Lavone Orn, MD   Chief Complaint  Patient presents with   Abnormal Lab  Brief Narrative: 65 year old female with history of CKD stage III, type 2 diabetes chronic diastolic CHF, OSA on CPAP admitted with melena by home health, upon presentation in the ED had nausea and showed AKI -patient was admitted, managed with IV fluid hydration.  Symptomatic management.  She had intractable nausea vomiting that resolved, renal function more or less stable.  Found to be very weak 2 ASSIST and since not much help at home . Toc involved, looking into multiple facilities. Pending SNF  Subjective: Somewhat frustrated that she has not has place available yet.  Reports she will go home tomorrow if still bed not available.  Assessment & Plan:  Intractable nausea and vomiting: Resolved.  Tolerating diet..    AKI on CKD stage IIIb:on admission 2.7 Suspect prerenal due to dehydration with poor oral intake.creatinine stable and improved .monitor lab intermittently continue oral hydration.   Recent Labs  Lab 09/19/20 1628  BUN 18  CREATININE 1.41*   Hypokalemia:resolved.  History of upper GI bleeding duodenal ulcer on last endoscopy 11/15/2019:Continue PPI avoid NSAIDs  Diarrhea, following constipation after being on stool softener.  No fever.  No leukocytosis no abdominal pain.  Continue as needed Imodium.  Laxative discontinued.  Pruritus: Improved after increasing Atarax to 25 mg prn, on hydrocortisone lotion prn.  Type 2 diabetes mellitus with stage 3 chronic kidney disease:Hba1c 6.0 on 7/19, blood sugar well controlled.continue sliding scale insulin Recent Labs  Lab 09/22/20 2025 09/23/20 0641 09/23/20 1125 09/23/20 1613 09/23/20 2017  GLUCAP 153* 105* 145* 143* 145*   WCH:ENID lipitor  Morbid obesity with BMI 51:Will benefit with weight loss, healthy lifestyle.  Chronic diastolic CHF:EF 78-24%,  compensated.  Filed Weights   09/08/20 1622  Weight: 127.9 kg   OSA on CPAP-cont same  Functional quadriplegia Deconditioning/debility  continue PT OT-pending placement  Increased lipase likely from dehydration no abdominal pain  Anxiety issues/endorses depression: cont prn Atarax, agrees to be on Lexapro.  Complains of eye soreness/suspect conjunctivitis-continue tobramycin eyedrop, and NSAIDs improved EYE ITCHING  Diet Order             Diet Carb Modified Fluid consistency: Thin; Room service appropriate? Yes  Diet effective now                   Nutrition Problem: Inadequate oral intake Etiology: nausea, poor appetite Signs/Symptoms: percent weight loss, meal completion < 25%, per patient/family report Percent weight loss: 12.2 % Interventions: Boost Breeze, MVI, Prostat Patient's Body mass index is 51.57 kg/m.  DVT prophylaxis: lovenox Code Status:   Code Status: Full Code  Family Communication: plan of care discussed with patient at bedside. Status is: Inpatient Remains inpatient appropriate because:Unsafe d/c plan Dispo: Patient From: Home  Planned Disposition: [place] once bed available.  Medically stable for discharge: yes   Unresulted Labs (From admission, onward)    None       Medications reviewed:  Scheduled Meds:  (feeding supplement) PROSource Plus  30 mL Oral TID BM   atorvastatin  10 mg Oral Daily   enoxaparin (LOVENOX) injection  60 mg Subcutaneous Q24H   escitalopram  5 mg Oral Daily   hydrocortisone cream   Topical TID   midodrine  15 mg Oral TID WC   mirtazapine  7.5 mg Oral QHS   multivitamin with  minerals  1 tablet Oral Daily   pantoprazole  40 mg Oral QHS   sucralfate  1 g Oral TID WC & HS   Continuous Infusions: Consultants:see note  Procedures:see note Antimicrobials: Anti-infectives (From admission, onward)    None      Culture/Microbiology    Component Value Date/Time   SDES  07/27/2020 1640     URINE, RANDOM Performed at [facility], [place] [address] [place], [place] [address]    SPECREQUEST  07/27/2020 1640    NONE Performed at [facility], [place] [address] [place], [place] [address]    CULT 50,000 COLONIES/mL KLEBSIELLA PNEUMONIAE (A) 07/27/2020 1640   REPTSTATUS 07/30/2020 FINAL 07/27/2020 1640    Other culture-see note  Objective: Vitals: Today's Vitals   09/23/20 2240 09/23/20 2325 09/24/20 0723 09/24/20 0946  BP:   109/67   Pulse:   100   Resp:   18   Temp:   98.1 F (36.7 C)   TempSrc:   Oral   SpO2:   100%   Weight:      Height:      PainSc: 4  Asleep  2     Intake/Output Summary (Last 24 hours) at 09/24/2020 1115 Last data filed at 09/24/2020 0731 Gross per 24 hour  Intake --  Output 1600 ml  Net -1600 ml    Filed Weights   09/08/20 1622  Weight: 127.9 kg   Weight change:   Intake/Output from previous day: 08/03 0701 - 08/04 0700 In: -  Out: 950 [Urine:950] Intake/Output this shift: Total I/O In: -  Out: 650 [Urine:650] Filed Weights   09/08/20 1622  Weight: 127.9 kg   Examination: General exam: AAOx e, obese, older than stated age, weak appearing. HEENT:Oral mucosa moist, Ear/Nose WNL grossly, dentition normal. Respiratory system: bilaterally diminished,  no use of accessory muscle Cardiovascular system: S1 & S2 +, No JVD,. Gastrointestinal system: Abdomen soft, NT,ND, BS+ Nervous System:Alert, awake, moving extremities and grossly nonfocal Extremities: no edema, distal peripheral pulses palpable.  Skin: No rashes,no icterus. MSK: Normal muscle bulk,tone, power    Data Reviewed: I have personally reviewed following labs and imaging studies CBC: Recent Labs  Lab 09/19/20 1628  WBC 5.1  HGB 10.8*  HCT 34.8*  MCV 89.2  PLT 337     Basic Metabolic Panel: Recent Labs  Lab 09/19/20 1628  NA 139  K 3.9  CL 103  CO2 25  GLUCOSE 124*  BUN 18  CREATININE 1.41*  CALCIUM 8.1*     GFR: Estimated Creatinine Clearance: 49.6 mL/min (A) (by C-G formula based on SCr of 1.41 mg/dL (H)). Liver Function Tests: No results for input(s): AST, ALT, ALKPHOS, BILITOT, PROT, ALBUMIN in the last 168 hours.  No results for input(s): LIPASE, AMYLASE in the last 168 hours.  No results for input(s): AMMONIA in the last 168 hours. Coagulation Profile: No results for input(s): INR, PROTIME in the last 168 hours. Cardiac Enzymes: No results for input(s): CKTOTAL, CKMB, CKMBINDEX, TROPONINI in the last 168 hours.  BNP (last 3 results) No results for input(s): PROBNP in the last 8760 hours. HbA1C: No results for input(s): HGBA1C in the last 72 hours. CBG: Recent Labs  Lab 09/22/20 2025 09/23/20 0641 09/23/20 1125 09/23/20 1613 09/23/20 2017  GLUCAP 153* 105* 145* 143* 145*    Lipid Profile: No results for input(s): CHOL, HDL, LDLCALC, TRIG, CHOLHDL, LDLDIRECT in the last 72 hours. Thyroid Function Tests: No results for input(s): TSH, T4TOTAL,  FREET4, T3FREE, THYROIDAB in the last 72 hours. Anemia Panel: No results for input(s): VITAMINB12, FOLATE, FERRITIN, TIBC, IRON, RETICCTPCT in the last 72 hours. Sepsis Labs: No results for input(s): PROCALCITON, LATICACIDVEN in the last 168 hours.   No results found for this or any previous visit (from the past 240 hour(s)).     Radiology Studies: No results found.   LOS: 16 days   Antonieta Pert, MD [facility]  09/24/2020, 11:15 AM

## 2020-09-24 NOTE — TOC Progression Note (Signed)
Transition of Care [facility]) - Progression Note    Patient Details  Name: [name] [name] MRN: [id number] Date of Birth: 10/11/1955  [facility]) CM/SW Contact  Milinda Antis, [place] Phone Number: 09/24/2020, 3:17 PM  Clinical Narrative:    15:17-  CSW attempted to contact [facility] [phone]).  There was no answer.  CSW left another message requesting a returned call.   (Seven of the eight facilities contracted with the [facility] report not having beds available.  CSW is awaiting response from the only agency left, [facility].)  15:19- CSW called Lemmie [phone]) SW with the [place].  CSW was informed that if none of the agencies were able to accept, then CSW would have to look into [place] facilities outside of [place] or ask the patient if the patient's medicare will cover SNF placement.  CSW inquired about a list of [place] facilities outside of [place] and was directed to contact Waldo Laine at [phone].  15:23- [place] [phone])-  CSW contacted [facility] to request assistance/ input with finding a [place] facility.  There was no answer.  CSW left a VM.    16:08-  CSW called Manuela Schwartz Covern,([phone]) community health nurse with the [facility] [place] to request out of state VA information.  There was no answer.  CSW did not leave a VM due to the voice mail message that Ms. Covern would be out of the office until 8/15.    Pending: VA LTC bed offer  16:14-  CSW contacted the patient to inquire about whether she has Medicare. The patient reported that she has [place].  CSW inquired about the patient's willingness to go to a LTC facility using her Medicare.  The patient reported that the last time she went to a facility through Medicare the facility brought her to the ED to "get checked out" and then upon arrival told the patient that her "insurance ran out and that they(the facility) needed the hospital to recommend skilled nursing again."      CSW explained the insurance authorization process and informed the patient about not receiving a bed offer from [place] facilities.  The patient was also informed that CSW could attempt to find a facility who would accept [place] or look for [place] facilities outside of [place].  CSW asked the patient if she would be willing to go to a facility at this time.  The patient reported that she is "not going anywhere long term".  Patient reported that she would only go to a SNF if she could get "in writing" that she would only be at the facility 20 days.  The patient stated that she wanted to go home.  CSW inquired about the patient's families views on the patient returning home.  The patient replied that she "does not care what they say".  Due to the patient being adamant about returning home, CSW then inquired about the assistance the patient would receive at the home.  The patient reported that she would call the [facility] so that they can reinstate the in home aide assistance that she was receiving prior to hospitalization.  The patient reports care assistance at home as follows:  Monday- In home Aide from 0900-1200 hrs and then from 1700-2000 hrs.  Tuesday- Thursday= In home Aide from 09:30- 13:30 hrs and then from 1700-2000 hrs  Friday- Sunday= In home aide from 09:30- 13:30 with significant other staying at the patient home the  entire weekend (including over night)  The patient reports that the aides provide assistance with getting the patient up in the morning, cleaning, preparing food, and then getting the patient to bed at night.  The patient reports having a life alert necklace that she wears during the day and hangs on her bed rail at night.  Patient reports having a hoyer lift that was provided by the [place] and getting a motorized wheel chair soon. The patient also keeps her cell phone near and can lock and unlock her doors from the cell phone.  The patient reports that she has friends who can assist her during the  day if needed and the patient's significant other uses the patient's car and is available whenever the patient needs.  The patient did request either in home PT or outpatient PT.  When asked about transportation to appointments the patient replied that the [place] provides transportation to and from medical appointments.   CSW will discuss the above information with care team in morning meeting on 8/5.    Expected Discharge Plan: Long Term Nursing Home Barriers to Discharge: Other (must enter comment) (no LT/SNF bed offer)  Expected Discharge Plan and Services Expected Discharge Plan: [place]   Discharge Planning Services: CM Consult   Living arrangements for the past 2 months: Single Family Home                           HH Arranged: RN, PT (Active with [place]) [place]: [place] ([place]) Date [place]: 09/08/20 Time [place]: 1036 Representative spoke with at [place]: [place] ([place]) Interventions    Readmission Risk Interventions Readmission Risk Prevention Plan 02/06/2020 11/08/2019  Transportation Screening Complete Complete  Medication Review Press photographer) - Referral to Pharmacy  PCP or Specialist appointment within 3-5 days of discharge Complete Complete  HRI or [place] - Complete  SW Recovery Care/Counseling Consult Complete Complete  Palliative Care Screening Not Applicable Not Applicable  Skilled Nursing Facility Complete Complete  Some recent data might be hidden

## 2020-09-24 NOTE — Plan of Care (Signed)
  Problem: Clinical Measurements: Goal: Ability to maintain clinical measurements within normal limits will improve Outcome: Progressing   Problem: Nutrition: Goal: Adequate nutrition will be maintained Outcome: Progressing   Problem: Safety: Goal: Ability to remain free from injury will improve Outcome: Progressing   

## 2020-09-24 NOTE — Plan of Care (Signed)
  Problem: Education: Goal: Knowledge of General Education information will improve Description: Including pain rating scale, medication(s)/side effects and non-pharmacologic comfort measures Outcome: Completed/Met   Problem: Health Behavior/Discharge Planning: Goal: Ability to manage health-related needs will improve Outcome: Completed/Met

## 2020-09-25 ENCOUNTER — Other Ambulatory Visit (HOSPITAL_COMMUNITY): Payer: Self-pay

## 2020-09-25 LAB — GLUCOSE, CAPILLARY
Glucose-Capillary: 92 mg/dL (ref 70–99)
Glucose-Capillary: 99 mg/dL (ref 70–99)
Glucose-Capillary: 150 mg/dL — ABNORMAL HIGH (ref 70–99)
Glucose-Capillary: 123 mg/dL — ABNORMAL HIGH (ref 70–99)

## 2020-09-25 MED ORDER — PREGABALIN 50 MG PO CAPS
50.0000 mg | ORAL_CAPSULE | Freq: Three times a day (TID) | ORAL | 1 refills | Status: DC
  Filled 2020-09-25: qty 90, 30d supply, fill #0

## 2020-09-25 MED ORDER — FUROSEMIDE 80 MG PO TABS: 40.0000 mg | ORAL_TABLET | Freq: Two times a day (BID) | ORAL | 0 refills | Status: DC

## 2020-09-25 MED ORDER — ESCITALOPRAM OXALATE 5 MG PO TABS
5.0000 mg | ORAL_TABLET | Freq: Every day | ORAL | 2 refills | Status: DC
  Filled 2020-09-25: qty 30, 30d supply, fill #0

## 2020-09-25 MED ORDER — HYDROXYZINE HCL 25 MG PO TABS
25.0000 mg | ORAL_TABLET | Freq: Two times a day (BID) | ORAL | 0 refills | Status: AC | PRN
  Filled 2020-09-25: qty 30, 15d supply, fill #0

## 2020-09-25 MED ORDER — ESCITALOPRAM OXALATE 5 MG PO TABS
5.0000 mg | ORAL_TABLET | Freq: Every day | ORAL | 1 refills | Status: DC
Start: 2020-09-26 — End: 2020-09-25

## 2020-09-25 MED ORDER — FUROSEMIDE 80 MG PO TABS
40.0000 mg | ORAL_TABLET | Freq: Two times a day (BID) | ORAL | 1 refills | Status: DC
  Filled 2020-09-25: qty 60, 60d supply, fill #0

## 2020-09-25 NOTE — Progress Notes (Signed)
Discharge instructions given. No concerns voiced. Patients belongings all packed in patient's belongings bags. Awaiting PTAR for pickup.

## 2020-09-25 NOTE — TOC Progression Note (Signed)
Transition of Care [facility]) - Progression Note    Patient Details  Name: [name] [name] MRN: [id number] Date of Birth: [birth year]  Transition of Care [facility]) CM/SW Contact  Milinda Antis, [place] Phone Number: 09/25/2020, 11:23 AM  Clinical Narrative:    9:40-  CSW met with the patient at bedside along with RNCM to discuss the patient's discharge wishes.  The patient reports wanting to go home and having everything that is needed at the home.  The patient showed CSW a picture of the hospital bed that was recently delivered to her home and stated that she has a recliner.  The patient reported that she does not want to go anywhere "long term".  CSW spent extensive time at the bedside going over all options with the patient.  The MD then came into the room and CSW and MD spoke with the patient.  The patient informed MD of the same information.  Due to the patient continuing to be adamant about returning home and not wanting to be in a long term facility at this time (which is the PT and OT recommendations).  The MD will discharge the patient home. The patient reports that she has family nearby and a significant other who assist with needs.  She also has transportation to and from appointments through the [place].    CSW and patient called Eddie Dibbles with [facility] and verified that the patient receives in home health aides twice a day and that it can be reinstated as soon as the patient returns home.  CSW informed Eddie Dibbles that the patient will d/c home today.  Eddie Dibbles asked that the patient call when she is on the way home.  Patient informed.    RNCM will manage the rest of the home d/c needs.   Expected Discharge Plan: Long Term Nursing Home Barriers to Discharge: Other (must enter comment) (no LT/SNF bed offer)  Expected Discharge Plan and Services Expected Discharge Plan: [place]   Discharge Planning Services: CM Consult   Living arrangements for the past 2 months: Single Family  Home Expected Discharge Date: 09/25/20                         HH Arranged: RN, PT (Active with [place]) [place]: [place] ([facility]) Date [place]: 09/08/20 Time [place]: 1036 Representative spoke with at [place]: [place] ([place]) Interventions    Readmission Risk Interventions Readmission Risk Prevention Plan 02/06/2020 11/08/2019  Transportation Screening Complete Complete  Medication Review Press photographer) - Referral to Pharmacy  PCP or Specialist appointment within 3-5 days of discharge Complete Complete  HRI or [place] - Complete  SW Recovery Care/Counseling Consult Complete Complete  Palliative Care Screening Not Applicable Not Applicable  Skilled Nursing Facility Complete Complete  Some recent data might be hidden

## 2020-09-25 NOTE — TOC Transition Note (Addendum)
Transition of Care [facility]) - CM/SW Discharge Note   Patient Details  Name: [name] [name] MRN: [id number] Date of Birth: November 03, 1955  Transition of Care [facility]) CM/SW Contact:  Sharin Mons, RN Phone Number: 09/25/2020, 12:56 PM   Clinical Narrative:    Patient will DC to: home Anticipated DC date: 09/25/2020 Family notified: yes, son and sister Nurse, children's by: PTAR  Pt adamant regarding  refusal of LT/SNF placement. States she's going home.  Per MD patient ready for DC today. RN, patient, patient's family, and [facility] notified of DC.   Home health orders and D/C summary faxed to PCP: Raiford Simmonds @ [phone]. Clinicals fax to [facility]'s [facility] @ [phone]. NCM spoke with pt's primary nurse @ the [place], [place] [phone]). NCM made Erin aware of d/c plans.  [facility] pharmacy to deliver Rx meds to bedside prior to d/c.  DC packet on chart. Ambulance transport requested for patient.   RNCM will sign off for now as intervention is no longer needed. Please consult [place] again if new needs arise.   Final next level of care: Home w Home Health Services Barriers to Discharge: No Barriers Identified   Patient Goals and CMS Choice        Discharge Placement                       Discharge Plan and Services   Discharge Planning Services: CM Consult                      HH Arranged: RN, PT (Active with [place]) [place] Agency: [place] ([facility]) Date [place]: 09/08/20 Time [place]: 1036 Representative spoke with at [place]: [place] ([place]) Interventions     Readmission Risk Interventions Readmission Risk Prevention Plan 02/06/2020 11/08/2019  Transportation Screening Complete Complete  Medication Review Press photographer) - Referral to Pharmacy  PCP or Specialist appointment within 3-5 days of discharge Complete Complete  HRI or [place] - Complete   SW Recovery Care/Counseling Consult Complete Complete  Palliative Care Screening Not Applicable Not Applicable  Skilled Nursing Facility Complete Complete  Some recent data might be hidden

## 2020-09-25 NOTE — Discharge Summary (Addendum)
Physician Discharge Summary [facility]    Patient: [name] [name]                   Admit date: 09/07/2020   DOB: 1955-08-28             Discharge date:09/25/2020/11:51 AM GYF:[id number]                          PCP: Lavone Orn, MD  Disposition: HOME with Home Health   Patient refused skilled nursing facility, stating she has done well with a previous home health arrangement, which she will continue.  Recommendations for Outpatient Follow-up:   Follow up: PCP, cardiology, pulmonologist within 2 to 3 weeks Due to severe debility, aggressive PT OT recommended, fall precautions.   Discharge Condition: Stable   Code Status:   Code Status: Full Code  Diet recommendation: Diabetic diet   Discharge Diagnoses:    Active Problems:   Hypokalemia   Type 2 diabetes mellitus with stage 3 chronic kidney disease (HCC)   HLD (hyperlipidemia)   Pressure injury of skin   Morbid obesity (HCC)   Chronic diastolic CHF (congestive heart failure) (HCC)   Acute renal failure superimposed on stage 3a chronic kidney disease, unspecified acute renal failure type (HCC)   OSA on CPAP   Pancreatitis   Intractable nausea and vomiting   Functional quadriplegia ([place])   History of Present Illness/ Hospital Course Kathleen Argue Summary:   65 year old female with history of CKD stage III, type 2 diabetes chronic diastolic CHF, OSA on CPAP admitted with melena by home health, upon presentation in the ED had nausea and showed AKI -patient was admitted, managed with IV fluid hydration.  Symptomatic management.  She had intractable nausea vomiting that resolved, renal function more or less stable.  Found to be very weak 2 ASSIST and since not much help at home . Toc involved, looking into multiple facilities.  Patient refused long-term skilled nursing facility, agreed to home health.   Intractable nausea and vomiting: Resolved.  Tolerating diet..     AKI on CKD stage IIIb:on admission 2.7 Suspect  prerenal due to dehydration with poor oral intake.creatinine stable and improved .continue oral hydration.   Resume home diuretics at half dose Lasix was cut in half from 80 to 40 mg p.o. twice daily  Last Labs      Recent Labs  Lab 09/19/20 1628  BUN 18  CREATININE 1.41*     Hypokalemia:resolved.   History of upper GI bleeding duodenal ulcer on last endoscopy 11/15/2019:Continue PPI avoid NSAIDs   Diarrhea, following constipation after being on stool softener.  No fever.  No leukocytosis no abdominal pain.  Continue as needed Imodium.  Laxative discontinued.   Pruritus: Improved after increasing Atarax to 25 mg prn, on hydrocortisone lotion prn.   Type 2 diabetes mellitus with stage 3 chronic kidney disease:Hba1c 6.0 on 7/19, blood sugar well controlled.continue sliding scale insulin Last Labs          Recent Labs  Lab 09/22/20 2025 09/23/20 0641 09/23/20 1125 09/23/20 1613 09/23/20 2017  GLUCAP 153* 105* 145* 143* 145*     FFM:BWGY lipitor   Morbid obesity with BMI 51:Will benefit with weight loss, healthy lifestyle.   Chronic diastolic CHF:EF 65-99%, compensated.     Filed Weights    09/08/20 1622  Weight: 127.9 kg    OSA on CPAP-cont same   Functional quadriplegia Deconditioning/debility continue PT OT-commended long-term skilled  nursing facility-patient refused.  Agreed to be discharged home with home health stating She has a [employer], new bed... Family living close by   Increased lipase likely from dehydration no abdominal pain   Anxiety issues/endorses depression: cont prn Atarax, agrees to be on Lexapro.   Complains of eye soreness/suspect conjunctivitis-continue tobramycin eyedrop, and NSAIDs improved EYE ITCHING   Diet Order                  Diet Carb Modified Fluid consistency: Thin; Room service appropriate? Yes  Diet effective now                       Nutrition Problem: Inadequate oral intake Etiology: nausea, poor  appetite Signs/Symptoms: percent weight loss, meal completion < 25%, per patient/family report Percent weight loss: 12.2 % Interventions: Boost Breeze, MVI, Prostat Patient's Body mass index is 51.57 kg/m.   DVT prophylaxis: lovenox Code Status:   Code Status: Full Code  Family Communication: plan of care discussed with patient at bedside. Status is: Inpatient Remains inpatient appropriate because:Unsafe d/c plan Dispo:  Patient From: Home             Planned Disposition: Patient insisted to be discharged home with home health She refuses skilled nursing facility recommendations     Nutritional status:  Nutrition Problem: Inadequate oral intake Etiology: nausea, poor appetite Signs/Symptoms: percent weight loss, meal completion < 25%, per patient/family report Percent weight loss: 12.2 % Interventions: Boost Breeze, MVI, Prostat  The patient's BMI is: Body mass index is 51.57 kg/m. I agree with the assessment and plan as outlined below: Nutrition Status: Nutrition Problem: Inadequate oral intake Etiology: nausea, poor appetite Signs/Symptoms: percent weight loss, meal completion < 25%, per patient/family report Percent weight loss: 12.2 % Interventions: Boost Breeze, MVI, Prostat     SKIN assessment: I agree with skin assessment and plan as outlined below:    Risk of unplanned readmission Score:     Discharge Instructions:   Discharge Instructions     (HEART FAILURE PATIENTS) Call MD:  Anytime you have any of the following symptoms: 1) 3 pound weight gain in 24 hours or 5 pounds in 1 week 2) shortness of breath, with or without a dry hacking cough 3) swelling in the hands, feet or stomach 4) if you have to sleep on extra pillows at night in order to breathe.   Complete by: As directed    Activity as tolerated - No restrictions   Complete by: As directed    Call MD for:  difficulty breathing, headache or visual disturbances   Complete by: As directed    Call MD  for:  extreme fatigue   Complete by: As directed    Call MD for:  persistant dizziness or light-headedness   Complete by: As directed    Call MD for:  persistant nausea and vomiting   Complete by: As directed    Call MD for:  redness, tenderness, or signs of infection (pain, swelling, redness, odor or green/yellow discharge around incision site)   Complete by: As directed    Call MD for:  severe uncontrolled pain   Complete by: As directed    Call MD for:  temperature >100.4   Complete by: As directed    Diet - low sodium heart healthy   Complete by: As directed    Discharge instructions   Complete by: As directed    Follow-up with home health, follow-up with PCP in  1 week.   Discharge wound care:   Complete by: As directed    Per wound care nursing instructions.   Increase activity slowly   Complete by: As directed         Medication List     STOP taking these medications    famotidine 20 MG tablet Commonly known as: PEPCID   mirtazapine 7.5 MG tablet Commonly known as: REMERON   pioglitazone 45 MG tablet Commonly known as: ACTOS   polyethylene glycol 17 g packet Commonly known as: MIRALAX / GLYCOLAX   senna-docusate 8.6-50 MG tablet Commonly known as: Senokot-S       TAKE these medications    albuterol 108 (90 Base) MCG/ACT inhaler Commonly known as: VENTOLIN HFA Inhale 2 puffs into the lungs every 6 (six) hours as needed for wheezing or shortness of breath.   Alogliptin Benzoate 12.5 MG Tabs Take 6.25 mg by mouth daily. In addition to pioglitazone   atorvastatin 10 MG tablet Commonly known as: LIPITOR Take 10 mg by mouth daily.   chlorthalidone 25 MG tablet Commonly known as: HYGROTON Take 25 mg by mouth every morning.   empagliflozin 25 MG Tabs tablet Commonly known as: JARDIANCE Take 12.5 mg by mouth daily.   escitalopram 5 MG tablet Commonly known as: LEXAPRO Take 1 tablet (5 mg total) by mouth daily. Start taking on: September 26, 2020    furosemide 80 MG tablet Commonly known as: LASIX Take 0.5 tablets (40 mg total) by mouth 2 (two) times daily. What changed: how much to take   hydrocortisone 2.5 % lotion Apply 1 application topically 2 (two) times daily. Face itching   hydrOXYzine 25 MG tablet Commonly known as: ATARAX/VISTARIL Take 1 tablet (25 mg total) by mouth every 12 (twelve) hours as needed for itching or anxiety.   midodrine 5 MG tablet Commonly known as: PROAMATINE Take 3 tablets (15 mg total) by mouth 3 (three) times daily with meals.   omeprazole 20 MG tablet Commonly known as: PriLOSEC OTC Take 2 tablets (40 mg total) by mouth daily.   ondansetron 4 MG tablet Commonly known as: ZOFRAN Take 1 tablet (4 mg total) by mouth every 6 (six) hours as needed for nausea.   potassium chloride SA 20 MEQ tablet Commonly known as: KLOR-CON Take 1 tablet (20 mEq total) by mouth 2 (two) times daily.   pregabalin 50 MG capsule Commonly known as: Lyrica Take 1 capsule (50 mg total) by mouth 3 (three) times daily.   sitaGLIPtin 100 MG tablet Commonly known as: JANUVIA Take 100 mg by mouth daily.        Allergies  Allergen Reactions   Lisinopril Swelling   Morphine Itching and Rash   Penicillins Hives, Itching, Nausea And Vomiting and Rash    Has patient had a PCN reaction causing immediate rash, facial/tongue/throat swelling, SOB or lightheadedness with hypotension: No Has patient had a PCN reaction causing severe rash involving mucus membranes or skin necrosis: No Has patient had a PCN reaction that required hospitalization: No Has patient had a PCN reaction occurring within the last 10 years: No If all of the above answers are "NO", then may proceed with Cephalosporin use.      Procedures /Studies:   CT Abdomen Pelvis Wo Contrast  Result Date: 09/07/2020 CLINICAL DATA:  Painful abdominal wound EXAM: CT ABDOMEN AND PELVIS WITHOUT CONTRAST TECHNIQUE: Multidetector CT imaging of the abdomen and  pelvis was performed following the standard protocol without IV contrast. Unenhanced CT was performed  per clinician order. Lack of IV contrast limits sensitivity and specificity, especially for evaluation of abdominal/pelvic solid viscera. COMPARISON:  08/25/2020 FINDINGS: Lower chest: No acute pleural or parenchymal lung disease. Extensive atherosclerosis of the coronary vasculature. Hepatobiliary: No focal liver abnormality is seen. No gallstones, gallbladder wall thickening, or biliary dilatation. Pancreas: Unremarkable. No pancreatic ductal dilatation or surrounding inflammatory changes. Likely embolic coils just ventral to the pancreatic head. Spleen: Normal in size without focal abnormality. Adrenals/Urinary Tract: No urinary tract calculi or obstructive uropathy. Adrenals are grossly unremarkable. The bladder is decompressed, limiting its evaluation. Stomach/Bowel: No bowel obstruction or ileus. Stable colonic diverticulosis without diverticulitis. No bowel wall thickening or inflammatory change. Vascular/Lymphatic: Aortic atherosclerosis. No enlarged abdominal or pelvic lymph nodes. Reproductive: Status post hysterectomy. No adnexal masses. Other: No free fluid or free gas.  No abdominal wall hernia. Musculoskeletal: No acute or destructive bony lesions. Marked spondylosis at L4-5 unchanged. Minimal subcutaneous fat stranding within the lower anterior abdominal wall within a large pannus likely reflects sequela of prior injection. No fluid collection or abscess. Reconstructed images demonstrate no additional findings. IMPRESSION: 1. No acute abnormality within the abdomen or pelvis to explain the patient's pain on this unenhanced exam. 2. Stable diverticulosis without diverticulitis. 3.  Aortic Atherosclerosis (ICD10-I70.0). Electronically Signed   By: Randa Ngo M.D.   On: 09/07/2020 18:18   CT HEAD WO CONTRAST  Result Date: 09/08/2020 CLINICAL DATA:  Dizziness EXAM: CT HEAD WITHOUT CONTRAST  TECHNIQUE: Contiguous axial images were obtained from the base of the skull through the vertex without intravenous contrast. COMPARISON:  11/05/2019 FINDINGS: Brain: No evidence of acute infarction, hemorrhage, hydrocephalus, extra-axial collection or mass lesion/mass effect. Mild cortical atrophy. Mild subcortical white matter and periventricular small vessel ischemic changes. Vascular: Intracranial atherosclerosis. Skull: Normal. Negative for fracture or focal lesion. Sinuses/Orbits: Visualized paranasal sinuses are clear. Chronic opacification of the left mastoid air cells. Other: None. IMPRESSION: No evidence of acute intracranial abnormality. Atrophy with mild small vessel ischemic changes. Chronic left mastoid opacification. Electronically Signed   By: Julian Hy M.D.   On: 09/08/2020 00:16    Subjective:   Patient was seen and examined 09/25/2020, 11:51 AM Patient stable today. No acute distress.  No issues overnight Stable for discharge.  Discharge Exam:    Vitals:   09/24/20 0723 09/24/20 1416 09/24/20 2140 09/25/20 0755  BP: 109/67 (!) 135/50 131/66 (!) 146/46  Pulse: 100 73 86 97  Resp: 18 20 18 18   Temp: 98.1 F (36.7 C) 98.2 F (36.8 C) 98.3 F (36.8 C) 98.2 F (36.8 C)  TempSrc: Oral Oral Oral Oral  SpO2: 100% 100% 100% 98%  Weight:      Height:        General: Pt lying comfortably in bed & appears in no obvious distress. Cardiovascular: S1 & S2 heard, RRR, S1/S2 +. No murmurs, rubs, gallops or clicks. No JVD or pedal edema. Respiratory: Clear to auscultation without wheezing, rhonchi or crackles. No increased work of breathing. Abdominal:  Non-distended, non-tender & soft. No organomegaly or masses appreciated. Normal bowel sounds heard. CNS: Alert and oriented. No focal deficits. Extremities: no edema, no cyanosis      The results of significant diagnostics from this hospitalization (including imaging, microbiology, ancillary and laboratory) are listed below  for reference.      Microbiology:   No results found for this or any previous visit (from the past 240 hour(s)).   Labs:   CBC: Recent Labs  Lab 09/19/20 1628  WBC 5.1  HGB 10.8*  HCT 34.8*  MCV 89.2  PLT 683   Basic Metabolic Panel: Recent Labs  Lab 09/19/20 1628  NA 139  K 3.9  CL 103  CO2 25  GLUCOSE 124*  BUN 18  CREATININE 1.41*  CALCIUM 8.1*   Liver Function Tests: No results for input(s): AST, ALT, ALKPHOS, BILITOT, PROT, ALBUMIN in the last 168 hours. BNP (last 3 results) Recent Labs    11/22/19 1417 07/25/20 1432 08/25/20 1800  BNP 333.7* 119.6* 55.3   Cardiac Enzymes: No results for input(s): CKTOTAL, CKMB, CKMBINDEX, TROPONINI in the last 168 hours. CBG: Recent Labs  Lab 09/23/20 2017 09/24/20 1118 09/24/20 1653 09/24/20 2012 09/25/20 0644  GLUCAP 145* 125* 110* 140* 92   Hgb A1c No results for input(s): HGBA1C in the last 72 hours. Lipid Profile No results for input(s): CHOL, HDL, LDLCALC, TRIG, CHOLHDL, LDLDIRECT in the last 72 hours. Thyroid function studies No results for input(s): TSH, T4TOTAL, T3FREE, THYROIDAB in the last 72 hours.  Invalid input(s): FREET3 Anemia work up No results for input(s): VITAMINB12, FOLATE, FERRITIN, TIBC, IRON, RETICCTPCT in the last 72 hours. Urinalysis    Component Value Date/Time   COLORURINE YELLOW 09/08/2020 0247   APPEARANCEUR HAZY (A) 09/08/2020 0247   LABSPEC 1.013 09/08/2020 0247   PHURINE 5.0 09/08/2020 0247   GLUCOSEU NEGATIVE 09/08/2020 0247   HGBUR MODERATE (A) 09/08/2020 0247   BILIRUBINUR NEGATIVE 09/08/2020 0247   KETONESUR 5 (A) 09/08/2020 0247   PROTEINUR NEGATIVE 09/08/2020 0247   UROBILINOGEN 0.2 06/28/2014 1500   NITRITE NEGATIVE 09/08/2020 0247   LEUKOCYTESUR SMALL (A) 09/08/2020 0247         Time coordinating discharge: Over 55 minutes  SIGNED: Deatra James, MD, FACP, FHM. [facility],  Please use [email or web address] to Page If 7PM-7AM, please contact  night-coverage [email or web address] 09/25/2020, 11:51 AM

## 2020-10-08 DIAGNOSIS — L03119 Cellulitis of unspecified part of limb: Secondary | ICD-10-CM | POA: Diagnosis not present

## 2020-10-08 DIAGNOSIS — M25552 Pain in left hip: Secondary | ICD-10-CM | POA: Diagnosis not present

## 2020-10-21 ENCOUNTER — Telehealth: Payer: Self-pay

## 2020-10-21 NOTE — Telephone Encounter (Signed)
Left a message for patient's son Lanny Hurst regarding rescheduling Palliative consult. Patient was in the hospital.

## 2020-11-08 DIAGNOSIS — L03119 Cellulitis of unspecified part of limb: Secondary | ICD-10-CM | POA: Diagnosis not present

## 2020-11-08 DIAGNOSIS — M25552 Pain in left hip: Secondary | ICD-10-CM | POA: Diagnosis not present

## 2021-01-01 ENCOUNTER — Other Ambulatory Visit (HOSPITAL_COMMUNITY): Payer: Self-pay | Admitting: Psychiatry

## 2021-01-01 DIAGNOSIS — I2510 Atherosclerotic heart disease of native coronary artery without angina pectoris: Secondary | ICD-10-CM

## 2021-01-11 ENCOUNTER — Telehealth (HOSPITAL_COMMUNITY): Payer: Self-pay | Admitting: *Deleted

## 2021-01-11 NOTE — Telephone Encounter (Signed)
Reaching out to patient to cancel scheduled Cardiac CT scan due to patient's kidney function.  [facility] CT [facility] and will call us back when they have it.  Pt informed she will be contact by the [facility] regarding this matter. She verbalized understanding.  Gordy Clement RN Navigator Cardiac Imaging [facility] [phone] office [phone] cell

## 2021-01-13 ENCOUNTER — Ambulatory Visit (HOSPITAL_COMMUNITY): Admission: RE | Admit: 2021-01-13 | Payer: No Typology Code available for payment source | Source: Ambulatory Visit

## 2021-01-13 ENCOUNTER — Encounter (HOSPITAL_COMMUNITY): Payer: Self-pay

## 2021-02-01 ENCOUNTER — Observation Stay (HOSPITAL_COMMUNITY)
Admission: EM | Admit: 2021-02-01 | Discharge: 2021-02-03 | Disposition: A | Discharge status: Home / Self Care | Payer: No Typology Code available for payment source | Attending: Emergency Medicine | Admitting: Emergency Medicine

## 2021-02-01 ENCOUNTER — Emergency Department (HOSPITAL_COMMUNITY): Payer: No Typology Code available for payment source

## 2021-02-01 ENCOUNTER — Other Ambulatory Visit: Payer: Self-pay

## 2021-02-01 DIAGNOSIS — Z7984 Long term (current) use of oral hypoglycemic drugs: Secondary | ICD-10-CM | POA: Insufficient documentation

## 2021-02-01 DIAGNOSIS — Z79899 Other long term (current) drug therapy: Secondary | ICD-10-CM | POA: Insufficient documentation

## 2021-02-01 DIAGNOSIS — F419 Anxiety disorder, unspecified: Secondary | ICD-10-CM | POA: Diagnosis present

## 2021-02-01 DIAGNOSIS — I5032 Chronic diastolic (congestive) heart failure: Secondary | ICD-10-CM | POA: Diagnosis not present

## 2021-02-01 DIAGNOSIS — Z87891 Personal history of nicotine dependence: Secondary | ICD-10-CM | POA: Diagnosis not present

## 2021-02-01 DIAGNOSIS — R532 Functional quadriplegia: Secondary | ICD-10-CM | POA: Diagnosis present

## 2021-02-01 DIAGNOSIS — E1110 Type 2 diabetes mellitus with ketoacidosis without coma: Principal | ICD-10-CM

## 2021-02-01 DIAGNOSIS — Z20822 Contact with and (suspected) exposure to covid-19: Secondary | ICD-10-CM | POA: Diagnosis not present

## 2021-02-01 DIAGNOSIS — L89159 Pressure ulcer of sacral region, unspecified stage: Secondary | ICD-10-CM | POA: Insufficient documentation

## 2021-02-01 DIAGNOSIS — Z743 Need for continuous supervision: Secondary | ICD-10-CM | POA: Diagnosis not present

## 2021-02-01 DIAGNOSIS — B372 Candidiasis of skin and nail: Secondary | ICD-10-CM | POA: Insufficient documentation

## 2021-02-01 DIAGNOSIS — N179 Acute kidney failure, unspecified: Secondary | ICD-10-CM | POA: Insufficient documentation

## 2021-02-01 DIAGNOSIS — E1165 Type 2 diabetes mellitus with hyperglycemia: Secondary | ICD-10-CM | POA: Diagnosis not present

## 2021-02-01 DIAGNOSIS — R0902 Hypoxemia: Secondary | ICD-10-CM | POA: Diagnosis not present

## 2021-02-01 DIAGNOSIS — E1169 Type 2 diabetes mellitus with other specified complication: Secondary | ICD-10-CM | POA: Diagnosis present

## 2021-02-01 DIAGNOSIS — I130 Hypertensive heart and chronic kidney disease with heart failure and stage 1 through stage 4 chronic kidney disease, or unspecified chronic kidney disease: Secondary | ICD-10-CM | POA: Diagnosis not present

## 2021-02-01 DIAGNOSIS — N183 Chronic kidney disease, stage 3 unspecified: Secondary | ICD-10-CM | POA: Diagnosis present

## 2021-02-01 DIAGNOSIS — F32A Depression, unspecified: Secondary | ICD-10-CM | POA: Diagnosis present

## 2021-02-01 DIAGNOSIS — E1122 Type 2 diabetes mellitus with diabetic chronic kidney disease: Secondary | ICD-10-CM | POA: Insufficient documentation

## 2021-02-01 DIAGNOSIS — E785 Hyperlipidemia, unspecified: Secondary | ICD-10-CM | POA: Diagnosis present

## 2021-02-01 DIAGNOSIS — R531 Weakness: Secondary | ICD-10-CM | POA: Diagnosis present

## 2021-02-01 DIAGNOSIS — R739 Hyperglycemia, unspecified: Secondary | ICD-10-CM | POA: Diagnosis present

## 2021-02-01 DIAGNOSIS — N1831 Chronic kidney disease, stage 3a: Secondary | ICD-10-CM | POA: Diagnosis not present

## 2021-02-01 DIAGNOSIS — F411 Generalized anxiety disorder: Secondary | ICD-10-CM | POA: Diagnosis present

## 2021-02-01 DIAGNOSIS — E119 Type 2 diabetes mellitus without complications: Secondary | ICD-10-CM | POA: Diagnosis present

## 2021-02-01 DIAGNOSIS — I10 Essential (primary) hypertension: Secondary | ICD-10-CM | POA: Diagnosis present

## 2021-02-01 LAB — CBG MONITORING, ED
Glucose-Capillary: 589 mg/dL (ref 70–99)
Glucose-Capillary: 588 mg/dL (ref 70–99)
Glucose-Capillary: 488 mg/dL — ABNORMAL HIGH (ref 70–99)

## 2021-02-01 LAB — GLUCOSE, CAPILLARY
Glucose-Capillary: 138 mg/dL — ABNORMAL HIGH (ref 70–99)
Glucose-Capillary: 160 mg/dL — ABNORMAL HIGH (ref 70–99)
Glucose-Capillary: 188 mg/dL — ABNORMAL HIGH (ref 70–99)
Glucose-Capillary: 284 mg/dL — ABNORMAL HIGH (ref 70–99)
Glucose-Capillary: 363 mg/dL — ABNORMAL HIGH (ref 70–99)
Glucose-Capillary: 419 mg/dL — ABNORMAL HIGH (ref 70–99)
Glucose-Capillary: 435 mg/dL — ABNORMAL HIGH (ref 70–99)

## 2021-02-01 LAB — BASIC METABOLIC PANEL
Anion gap: 15 (ref 5–15)
BUN: 64 mg/dL — ABNORMAL HIGH (ref 8–23)
CO2: 28 mmol/L (ref 22–32)
Calcium: 9.7 mg/dL (ref 8.9–10.3)
Chloride: 85 mmol/L — ABNORMAL LOW (ref 98–111)
Creatinine, Ser: 2.33 mg/dL — ABNORMAL HIGH (ref 0.44–1.00)
GFR, Estimated: 22 mL/min — ABNORMAL LOW (ref >60)
Glucose, Bld: 622 mg/dL (ref 70–99)
Potassium: 3.7 mmol/L (ref 3.5–5.1)
Sodium: 128 mmol/L — ABNORMAL LOW (ref 135–145)

## 2021-02-01 LAB — CBC
HCT: 43.9 % (ref 36.0–46.0)
Hemoglobin: 13.9 g/dL (ref 12.0–15.0)
MCH: 25.2 pg — ABNORMAL LOW (ref 26.0–34.0)
MCHC: 31.7 g/dL (ref 30.0–36.0)
MCV: 79.7 fL — ABNORMAL LOW (ref 80.0–100.0)
Platelets: 464 10*3/uL — ABNORMAL HIGH (ref 150–400)
RBC: 5.51 MIL/uL — ABNORMAL HIGH (ref 3.87–5.11)
RDW: 13.7 % (ref 11.5–15.5)
WBC: 7.8 10*3/uL (ref 4.0–10.5)
nRBC: 0 % (ref 0.0–0.2)

## 2021-02-01 LAB — BLOOD GAS, VENOUS
Acid-Base Excess: 4.1 mmol/L — ABNORMAL HIGH (ref 0.0–2.0)
Bicarbonate: 30.8 mmol/L — ABNORMAL HIGH (ref 20.0–28.0)
O2 Saturation: 66.1 %
Patient temperature: 98.6
pCO2, Ven: 56.3 mmHg (ref 44.0–60.0)
pH, Ven: 7.357 (ref 7.250–7.430)
pO2, Ven: 37 mmHg (ref 32.0–45.0)

## 2021-02-01 LAB — LACTIC ACID, PLASMA: Lactic Acid, Venous: 2.8 mmol/L (ref 0.5–1.9)

## 2021-02-01 LAB — RESP PANEL BY RT-PCR (FLU A&B, COVID) ARPGX2
Influenza A by PCR: NEGATIVE
Influenza B by PCR: NEGATIVE
SARS Coronavirus 2 by RT PCR: NEGATIVE

## 2021-02-01 MED ORDER — ACETAMINOPHEN 325 MG PO TABS
650.0000 mg | ORAL_TABLET | Freq: Four times a day (QID) | ORAL | Status: DC | PRN
  Administered 2021-02-01: 650 mg via ORAL
  Filled 2021-02-01: qty 2

## 2021-02-01 MED ORDER — HEPARIN SODIUM (PORCINE) 5000 UNIT/ML IJ SOLN
5000.0000 [IU] | Freq: Three times a day (TID) | INTRAMUSCULAR | Status: DC
  Administered 2021-02-01 – 2021-02-03 (×6): 5000 [IU] via SUBCUTANEOUS
  Filled 2021-02-01 (×6): qty 1

## 2021-02-01 MED ORDER — SODIUM CHLORIDE 0.9 % IV SOLN: INTRAVENOUS | Status: DC

## 2021-02-01 MED ORDER — DEXTROSE IN LACTATED RINGERS 5 % IV SOLN: INTRAVENOUS | Status: DC

## 2021-02-01 MED ORDER — INSULIN REGULAR(HUMAN) IN NACL 100-0.9 UT/100ML-% IV SOLN
INTRAVENOUS | Status: DC
  Administered 2021-02-01: 11.5 [IU]/h via INTRAVENOUS
  Filled 2021-02-01: qty 100

## 2021-02-01 MED ORDER — CHLORHEXIDINE GLUCONATE 0.12 % MT SOLN
15.0000 mL | Freq: Two times a day (BID) | OROMUCOSAL | Status: DC
  Administered 2021-02-01 – 2021-02-03 (×2): 15 mL via OROMUCOSAL
  Filled 2021-02-01 (×4): qty 15

## 2021-02-01 MED ORDER — ORAL CARE MOUTH RINSE
15.0000 mL | Freq: Two times a day (BID) | OROMUCOSAL | Status: DC
  Administered 2021-02-03: 14:00:00 15 mL via OROMUCOSAL

## 2021-02-01 MED ORDER — DEXTROSE-NACL 5-0.45 % IV SOLN: INTRAVENOUS | Status: DC

## 2021-02-01 MED ORDER — CHLORHEXIDINE GLUCONATE CLOTH 2 % EX PADS
6.0000 | MEDICATED_PAD | Freq: Every day | CUTANEOUS | Status: DC
  Administered 2021-02-01 – 2021-02-03 (×3): 6 via TOPICAL

## 2021-02-01 MED ORDER — MELATONIN 3 MG PO TABS
3.0000 mg | ORAL_TABLET | Freq: Every evening | ORAL | Status: AC | PRN
  Administered 2021-02-01 – 2021-02-02 (×2): 3 mg via ORAL
  Filled 2021-02-01 (×2): qty 1

## 2021-02-01 MED ORDER — FLUCONAZOLE 100 MG PO TABS: 400.0000 mg | ORAL_TABLET | Freq: Every day | ORAL | Status: DC

## 2021-02-01 MED ORDER — FLUCONAZOLE 100 MG PO TABS
400.0000 mg | ORAL_TABLET | Freq: Once | ORAL | Status: AC
  Administered 2021-02-01: 400 mg via ORAL
  Filled 2021-02-01: qty 4

## 2021-02-01 MED ORDER — DEXTROSE 50 % IV SOLN: 0.0000 mL | INTRAVENOUS | Status: DC | PRN

## 2021-02-01 MED ORDER — FLUCONAZOLE 100 MG PO TABS: 200.0000 mg | ORAL_TABLET | Freq: Every day | ORAL | Status: DC

## 2021-02-01 MED ORDER — LACTATED RINGERS IV SOLN: INTRAVENOUS | Status: DC

## 2021-02-01 MED ORDER — PREGABALIN 50 MG PO CAPS
50.0000 mg | ORAL_CAPSULE | Freq: Once | ORAL | Status: AC
  Administered 2021-02-01: 50 mg via ORAL
  Filled 2021-02-01: qty 1
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
## 2021-02-01 NOTE — Progress Notes (Signed)
Pt refused cpap tonight x2.  RN aware.  Pt was advised that RT is available all night should she change her mind.

## 2021-02-01 NOTE — ED Provider Notes (Signed)
  Physical Exam  BP (!) 143/128   Pulse 78   Temp 98.3 F (36.8 C) (Oral)   Resp 20   Wt 135.6 kg   SpO2 91%   BMI 54.69 kg/m   Physical Exam  ED Course/Procedures     Ultrasound ED Peripheral IV (Provider)  Date/Time: 02/01/2021 5:48 PM Performed by: Varney Biles, MD Authorized by: Varney Biles, MD   Procedure details:    Indications: hydration, hypotension and poor IV access     Skin Prep: chlorhexidine gluconate     Location: Left external jugular.   Angiocath:  20 G   Bedside Ultrasound Guided: Yes     Images: not archived     Patient tolerated procedure without complications: Yes     Dressing applied: Yes   Comments:     Unsuccessful attempt x 1. Aborted is unlikely for the IV to remain functional given the short neck and pannus.  MDM     Varney Biles, MD 02/01/21 [phone]

## 2021-02-01 NOTE — ED Triage Notes (Signed)
Per EMS pt has high blood sugar. EMS machine running high.  Hx of knee fracture and yeast infection.  110/70 Bp HR 90 O2 94% RA
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
## 2021-02-02 ENCOUNTER — Encounter (HOSPITAL_COMMUNITY): Payer: Self-pay | Admitting: Internal Medicine

## 2021-02-02 DIAGNOSIS — E1110 Type 2 diabetes mellitus with ketoacidosis without coma: Secondary | ICD-10-CM | POA: Diagnosis not present

## 2021-02-02 DIAGNOSIS — R532 Functional quadriplegia: Secondary | ICD-10-CM

## 2021-02-02 DIAGNOSIS — F411 Generalized anxiety disorder: Secondary | ICD-10-CM

## 2021-02-02 DIAGNOSIS — E1122 Type 2 diabetes mellitus with diabetic chronic kidney disease: Secondary | ICD-10-CM

## 2021-02-02 DIAGNOSIS — N1832 Chronic kidney disease, stage 3b: Secondary | ICD-10-CM

## 2021-02-02 DIAGNOSIS — F32A Depression, unspecified: Secondary | ICD-10-CM

## 2021-02-02 DIAGNOSIS — R739 Hyperglycemia, unspecified: Secondary | ICD-10-CM | POA: Diagnosis not present

## 2021-02-02 DIAGNOSIS — E785 Hyperlipidemia, unspecified: Secondary | ICD-10-CM

## 2021-02-02 LAB — GLUCOSE, CAPILLARY
Glucose-Capillary: 164 mg/dL — ABNORMAL HIGH (ref 70–99)
Glucose-Capillary: 171 mg/dL — ABNORMAL HIGH (ref 70–99)
Glucose-Capillary: 193 mg/dL — ABNORMAL HIGH (ref 70–99)
Glucose-Capillary: 199 mg/dL — ABNORMAL HIGH (ref 70–99)
Glucose-Capillary: 218 mg/dL — ABNORMAL HIGH (ref 70–99)
Glucose-Capillary: 279 mg/dL — ABNORMAL HIGH (ref 70–99)
Glucose-Capillary: 359 mg/dL — ABNORMAL HIGH (ref 70–99)
Glucose-Capillary: 373 mg/dL — ABNORMAL HIGH (ref 70–99)

## 2021-02-02 LAB — CBC
HCT: 44.4 % (ref 36.0–46.0)
Hemoglobin: 13.9 g/dL (ref 12.0–15.0)
MCH: 25 pg — ABNORMAL LOW (ref 26.0–34.0)
MCHC: 31.3 g/dL (ref 30.0–36.0)
MCV: 79.9 fL — ABNORMAL LOW (ref 80.0–100.0)
Platelets: 428 10*3/uL — ABNORMAL HIGH (ref 150–400)
RBC: 5.56 MIL/uL — ABNORMAL HIGH (ref 3.87–5.11)
RDW: 13.8 % (ref 11.5–15.5)
WBC: 8.4 10*3/uL (ref 4.0–10.5)
nRBC: 0 % (ref 0.0–0.2)

## 2021-02-02 LAB — MRSA NEXT GEN BY PCR, NASAL: MRSA by PCR Next Gen: NOT DETECTED

## 2021-02-02 LAB — COMPREHENSIVE METABOLIC PANEL
ALT: 11 U/L (ref 0–44)
AST: 13 U/L — ABNORMAL LOW (ref 15–41)
Albumin: 3.2 g/dL — ABNORMAL LOW (ref 3.5–5.0)
Alkaline Phosphatase: 127 U/L — ABNORMAL HIGH (ref 38–126)
Anion gap: 14 (ref 5–15)
BUN: 62 mg/dL — ABNORMAL HIGH (ref 8–23)
CO2: 28 mmol/L (ref 22–32)
Calcium: 9.6 mg/dL (ref 8.9–10.3)
Chloride: 93 mmol/L — ABNORMAL LOW (ref 98–111)
Creatinine, Ser: 2.09 mg/dL — ABNORMAL HIGH (ref 0.44–1.00)
GFR, Estimated: 25 mL/min — ABNORMAL LOW (ref >60)
Glucose, Bld: 204 mg/dL — ABNORMAL HIGH (ref 70–99)
Potassium: 3.3 mmol/L — ABNORMAL LOW (ref 3.5–5.1)
Sodium: 135 mmol/L (ref 135–145)
Total Bilirubin: 0.5 mg/dL (ref 0.3–1.2)
Total Protein: 7.6 g/dL (ref 6.5–8.1)

## 2021-02-02 LAB — HIV ANTIBODY (ROUTINE TESTING W REFLEX): HIV Screen 4th Generation wRfx: NONREACTIVE

## 2021-02-02 MED ORDER — INSULIN ASPART 100 UNIT/ML IJ SOLN
0.0000 [IU] | Freq: Every day | INTRAMUSCULAR | Status: DC
  Administered 2021-02-02: 5 [IU] via SUBCUTANEOUS

## 2021-02-02 MED ORDER — PREGABALIN 50 MG PO CAPS
50.0000 mg | ORAL_CAPSULE | Freq: Three times a day (TID) | ORAL | Status: DC
  Administered 2021-02-02 – 2021-02-03 (×4): 50 mg via ORAL
  Filled 2021-02-02 (×4): qty 1

## 2021-02-02 MED ORDER — INSULIN ASPART 100 UNIT/ML IJ SOLN
0.0000 [IU] | Freq: Three times a day (TID) | INTRAMUSCULAR | Status: DC
Start: 2021-02-02 — End: 2021-02-03
  Administered 2021-02-02: 11 [IU] via SUBCUTANEOUS
  Administered 2021-02-02: 20 [IU] via SUBCUTANEOUS
  Administered 2021-02-02: 7 [IU] via SUBCUTANEOUS
  Administered 2021-02-02: 4 [IU] via SUBCUTANEOUS
  Administered 2021-02-03 (×2): 11 [IU] via SUBCUTANEOUS

## 2021-02-02 MED ORDER — MICONAZOLE NITRATE 2 % EX CREA
TOPICAL_CREAM | Freq: Two times a day (BID) | CUTANEOUS | Status: DC
  Filled 2021-02-02: qty 28

## 2021-02-02 MED ORDER — INSULIN GLARGINE-YFGN 100 UNIT/ML ~~LOC~~ SOLN
10.0000 [IU] | Freq: Every day | SUBCUTANEOUS | Status: DC
  Administered 2021-02-02 – 2021-02-03 (×2): 10 [IU] via SUBCUTANEOUS
  Filled 2021-02-02 (×2): qty 0.1

## 2021-02-02 MED ORDER — PANTOPRAZOLE SODIUM 40 MG PO TBEC
40.0000 mg | DELAYED_RELEASE_TABLET | Freq: Every day | ORAL | Status: DC
  Administered 2021-02-02 – 2021-02-03 (×2): 40 mg via ORAL
  Filled 2021-02-02 (×2): qty 1

## 2021-02-02 MED ORDER — ATORVASTATIN CALCIUM 10 MG PO TABS
10.0000 mg | ORAL_TABLET | Freq: Every day | ORAL | Status: DC
  Administered 2021-02-02 – 2021-02-03 (×2): 10 mg via ORAL
  Filled 2021-02-02 (×2): qty 1

## 2021-02-02 MED ORDER — POTASSIUM CHLORIDE CRYS ER 20 MEQ PO TBCR
40.0000 meq | EXTENDED_RELEASE_TABLET | Freq: Once | ORAL | Status: AC
  Administered 2021-02-02: 40 meq via ORAL
  Filled 2021-02-02: qty 2

## 2021-02-02 MED ORDER — HYDROXYZINE HCL 10 MG PO TABS
10.0000 mg | ORAL_TABLET | Freq: Three times a day (TID) | ORAL | Status: DC | PRN
  Administered 2021-02-02 – 2021-02-03 (×3): 10 mg via ORAL
  Filled 2021-02-02 (×5): qty 1

## 2021-02-02 MED ORDER — INSULIN GLARGINE-YFGN 100 UNIT/ML ~~LOC~~ SOLN
8.0000 [IU] | Freq: Once | SUBCUTANEOUS | Status: AC
  Administered 2021-02-02: 8 [IU] via SUBCUTANEOUS
  Filled 2021-02-02: qty 0.08

## 2021-02-02 MED ORDER — MIDODRINE HCL 5 MG PO TABS
15.0000 mg | ORAL_TABLET | Freq: Three times a day (TID) | ORAL | Status: DC
  Administered 2021-02-02 – 2021-02-03 (×4): 15 mg via ORAL
  Filled 2021-02-02 (×5): qty 3

## 2021-02-02 NOTE — TOC CM/SW Note (Signed)
°  Transition of Care [facility]) Screening Note   Patient Details  Name: [name] [name] Date of Birth: 1955/08/05   Transition of Care [facility]) CM/SW Contact:    Ross Ludwig, LCSW Phone Number: 02/02/2021, 10:48 AM    Transition of Care Department [facility]) has reviewed patient and no TOC needs have been identified at this time. We will continue to monitor patient advancement through interdisciplinary progression rounds. If new patient transition needs arise, please place a TOC consult.

## 2021-02-02 NOTE — Progress Notes (Signed)
Pt refused CPAP qhs stating that she rarely uses it at home and does not want to wear it here either.  Pt encouraged to contact RT should she change her mind.

## 2021-02-02 NOTE — Consult Note (Signed)
WOC Nurse Consult Note: Patient receiving care in [facility] ICU 1228. Primary RN assisting with turning and positioning for skin/wound assessment. 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 Also there is a full thickness wound to the left upper hip area within a body fold that measures 4.3 cm x 4 cm x 1 cm. It is 100% pink. Pressure Injury POA: Yes/No/NA Measurement: Wound bed: Drainage (amount, consistency, odor)  Periwound: Dressing procedure/placement/frequency:  Wash left lateral upper hip wound located in a body fold with soap and water. Place half a piece of Aquacel Advantage over the wound, then cover with a sacral foam dressing.  Use InterDry in every body fold following wound care orders. Do NOT use with lotions, creams, powders.  It is designed to be used alone.  For the labia, inner upper thighs, intergluteal cleft, was with soap and water, pat dry. Sprinkle heavy amount of Antifungal Powder into these areas. (Do NOT use where you are using InterDry).  Monitor the wound area(s) for worsening of condition such as: Signs/symptoms of infection,  Increase in size,  Development of or worsening of odor, Development of pain, or increased pain at the affected locations.  Notify the medical team if any of these develop.  Thank you for the consult.  Discussed plan of care with the bedside nurse.  [place] nurse will not follow at this time.  Please re-consult the [place] team if needed.  Val Riles, RN, MSN, CWOCN, CNS-BC, pager [phone]

## 2021-02-02 NOTE — Progress Notes (Signed)
Inpatient Diabetes Program Recommendations  AACE/ADA: New Consensus Statement on Inpatient Glycemic Control (2015)  Target Ranges:  Prepandial:   less than 140 mg/dL      Peak postprandial:   less than 180 mg/dL (1-2 hours)      Critically ill patients:  140 - 180 mg/dL   Lab Results  Component Value Date   GLUCAP 164 (H) 02/02/2021   HGBA1C 6.0 (H) 09/08/2020    Review of Glycemic Control DKA Diabetes history: DM 2 Outpatient Diabetes medications: Alogliptin 6.25 mg Daily, Jardiance 12.5 mg Daily, Januvia listed 100 mg Daily (listed) Current orders for Inpatient glycemic control:  IV insulin transitioning to Semglee 10 units, Novolog 0-20 units tid + hs  Inpatient Diabetes Program Recommendations:    D/c Jardiance at time of d/c and have pt follow up with prescriber.  Will watch pt on transition to SQ.  Last A1c 6 in 08/2020, wait for current A1c to see if home regimen needs to add insulin.  Thanks,  Tama Headings RN, MSN, BC-ADM Inpatient Diabetes Coordinator Team Pager [phone] (8a-5p)

## 2021-02-02 NOTE — Progress Notes (Signed)
PROGRESS NOTE    [name] [name]  OVF:[id number] DOB: Feb 04, 1956 DOA: 02/01/2021 PCP: System, Provider Not In    Brief Narrative:  [name] [name] was admitted to the hospital with the working diagnosis of hyperglycemia. DKA    65 yo female with the past medical history of type 2 diabetes mellitus, hypertension and obesity class 3, non ambulatory state who presented with fatigue and generalized weakness.  Persistent symptoms for about 2 weeks, home health nurse found her significantly hyperglycemic, EMS was called and patient was transferred to the hospital.  On her initial physical examination blood pressure 131/34, heart rate 91, respiratory rate 18, temperature 98.3, oxygen saturation 92% her lungs are clear to auscultation bilaterally, heart S1-S2, present, rhythmic, abdomen was soft positive yeast in the pelvis and axilla, no lower extremity edema.  Sodium 128, potassium 3.7, chloride 85, bicarb 28, glucose 622, BUN 64, creatinine 2.33.  Anion gap 15. White count 7.8, hemoglobin 13.9, hematocrit 43.9, platelets 464 SARS COVID-19 negative.  Chest radiograph no infiltrates.  Patient was placed on insulin drip for glucose control.  Assessment & Plan:   Principal Problem:   DKA (diabetic ketoacidosis) ([place]) Active Problems:   Type 2 diabetes mellitus with stage 3 chronic kidney disease (HCC)   HLD (hyperlipidemia)   Depression   Anxiety state   Essential hypertension   Functional quadriplegia (HCC)   Hyperglycemia   Class 3 obesity ([place])   DKA complicated with A4ZY/ dyslpidemia. Patient now is off insulin drip, she received 8 units of glargine this am at 2:00 am. Tolerating po well, no nausea or vomiting.  Glucose this am 204, capillary 218 and 162.  Anion gap is 9,6.   Plan to continue sq insulin basal, add 10 units glargine daily and continue with insulin sliding scale for glucose cover and monitoring.  Last Thursday she had her first monthly injection of a diabetic  medication, not clear the name.   Diabetic teaching.  Hold on oral antihyperglycemic medications for now, at home on empagliflozin and sitagliptin.  Resume statin therapy.   Ok to transfer to medical ward.   2. Yeast infection, groins and pelvis (worsening). Patient with worsening bilateral groin yeast infection. Continue with topical azole for therapy.   3. HTN and diastolic heart failure. Systolic blood pressure has been 93 to 606 mmHg systolic. Continue to hold on antihypertensive medications. Hold on IV fluids.  Patient at home on midodrine tid.   4. AKI on CKD stage 3a/ hypokalemia  pseudohyponatremia.  Renal function with serum cr at 2,0, K is 3,3 and serum bicarbonate 28 with Cl 93  Plan to add 40 meq Kcl and follow up renal function in am, avoid hypotension and nephrotoxic medications.  Hold furosemide.   5. Obesity class 3 and OSA.  Follow with PT and OT, patient has equipment at home and home health services.    Patient continue to be at high risk for worsening hyperglycemia.   Status is: Inpatient  Remains inpatient appropriate because: uncontrolled glucose   DVT prophylaxis: Heparin  Code Status:    full  Family Communication:   No family at the bedside     Subjective: Patient with no nausea or vomiting, no chest pain or dyspnea, has painful rash bilateral groins and genital area.   Objective: Vitals:   02/01/21 2300 02/02/21 0004 02/02/21 0149 02/02/21 0354  BP:  (!) 108/42  (!) 93/47  Pulse:  79  74  Resp:  (!) 21  17  Temp: 98.2  F (36.8 C)     TempSrc: Oral  Oral   SpO2:  94%  94%  Weight:      Height:   5\' 1"  (1.549 m)     Intake/Output Summary (Last 24 hours) at 02/02/2021 8453 Last data filed at 02/02/2021 0600 Gross per 24 hour  Intake 1436.92 ml  Output 401 ml  Net 1035.92 ml   Filed Weights   02/01/21 1310  Weight: 135.6 kg    Examination:   General: Not in pain or dyspnea  Neurology: Awake and alert, non focal  E ENT: no  pallor, no icterus, oral mucosa moist Cardiovascular: No JVD. S1-S2 present, rhythmic, no gallops, rubs, or murmurs. No lower extremity edema. Pulmonary: positive breath sounds bilaterally, adequate air movement, no wheezing, rhonchi or rales. Gastrointestinal. Abdomen protuberant, non tender.  Skin. Skin folds with yeast infection bilateral groins.  Musculoskeletal: left leg is shortened.      Data Reviewed: I have personally reviewed following labs and imaging studies  CBC: Recent Labs  Lab 02/01/21 1334 02/02/21 0310  WBC 7.8 8.4  HGB 13.9 13.9  HCT 43.9 44.4  MCV 79.7* 79.9*  PLT 464* 646*   Basic Metabolic Panel: Recent Labs  Lab 02/01/21 1334 02/02/21 0310  NA 128* 135  K 3.7 3.3*  CL 85* 93*  CO2 28 28  GLUCOSE 622* 204*  BUN 64* 62*  CREATININE 2.33* 2.09*  CALCIUM 9.7 9.6   GFR: Estimated Creatinine Clearance: 34.2 mL/min (A) (by C-G formula based on SCr of 2.09 mg/dL (H)). Liver Function Tests: Recent Labs  Lab 02/02/21 0310  AST 13*  ALT 11  ALKPHOS 127*  BILITOT 0.5  PROT 7.6  ALBUMIN 3.2*   No results for input(s): LIPASE, AMYLASE in the last 168 hours. No results for input(s): AMMONIA in the last 168 hours. Coagulation Profile: No results for input(s): INR, PROTIME in the last 168 hours. Cardiac Enzymes: No results for input(s): CKTOTAL, CKMB, CKMBINDEX, TROPONINI in the last 168 hours. BNP (last 3 results) No results for input(s): PROBNP in the last 8760 hours. HbA1C: No results for input(s): HGBA1C in the last 72 hours. CBG: Recent Labs  Lab 02/02/21 0043 02/02/21 0143 02/02/21 0243 02/02/21 0347 02/02/21 0751  GLUCAP 171* 199* 193* 218* 164*   Lipid Profile: No results for input(s): CHOL, HDL, LDLCALC, TRIG, CHOLHDL, LDLDIRECT in the last 72 hours. Thyroid Function Tests: No results for input(s): TSH, T4TOTAL, FREET4, T3FREE, THYROIDAB in the last 72 hours. Anemia Panel: No results for input(s): VITAMINB12, FOLATE, FERRITIN,  TIBC, IRON, RETICCTPCT in the last 72 hours.    Radiology Studies: I have reviewed all of the imaging during this hospital visit personally     Scheduled Meds:  chlorhexidine  15 mL Mouth Rinse BID   Chlorhexidine Gluconate Cloth  6 each Topical Daily   fluconazole  200 mg Oral Daily   heparin  5,000 Units Subcutaneous Q8H   insulin aspart  0-20 Units Subcutaneous TID WC   insulin aspart  0-5 Units Subcutaneous QHS   mouth rinse  15 mL Mouth Rinse q12n4p   Continuous Infusions:  sodium chloride Stopped (02/02/21 0000)   dextrose 5 % and 0.45% NaCl Stopped (02/02/21 0345)     LOS: 1 day        Ogle Hoeffner Gerome Apley, MD

## 2021-02-02 NOTE — Care Management CC44 (Signed)
Condition Code 44 Documentation Completed  Patient Details  Name: [name] [name] MRN: [id number] Date of Birth: October 03, 1955   Condition Code 44 given:  Yes Patient signature on Condition Code 44 notice:  Yes Documentation of 2 MD's agreement:  Yes Code 44 added to claim:  Yes    Ross Ludwig, LCSW 02/02/2021, 1:16 PM

## 2021-02-02 NOTE — Care Management Obs Status (Signed)
[facility] NOTIFICATION   Patient Details  Name: EMMIE FRAKES MRN: [id number] Date of Birth: 1955-08-08   Medicare Observation Status Notification Given:  Yes    Ross Ludwig, LCSW 02/02/2021, 1:16 PM

## 2021-02-03 ENCOUNTER — Other Ambulatory Visit (HOSPITAL_COMMUNITY): Payer: Self-pay

## 2021-02-03 DIAGNOSIS — E1110 Type 2 diabetes mellitus with ketoacidosis without coma: Secondary | ICD-10-CM | POA: Diagnosis not present

## 2021-02-03 DIAGNOSIS — Z7401 Bed confinement status: Secondary | ICD-10-CM | POA: Diagnosis not present

## 2021-02-03 DIAGNOSIS — M2550 Pain in unspecified joint: Secondary | ICD-10-CM | POA: Diagnosis not present

## 2021-02-03 LAB — BASIC METABOLIC PANEL
Anion gap: 13 (ref 5–15)
BUN: 59 mg/dL — ABNORMAL HIGH (ref 8–23)
CO2: 27 mmol/L (ref 22–32)
Calcium: 9.8 mg/dL (ref 8.9–10.3)
Chloride: 96 mmol/L — ABNORMAL LOW (ref 98–111)
Creatinine, Ser: 2.16 mg/dL — ABNORMAL HIGH (ref 0.44–1.00)
GFR, Estimated: 25 mL/min — ABNORMAL LOW (ref >60)
Glucose, Bld: 277 mg/dL — ABNORMAL HIGH (ref 70–99)
Potassium: 4 mmol/L (ref 3.5–5.1)
Sodium: 136 mmol/L (ref 135–145)

## 2021-02-03 LAB — GLUCOSE, CAPILLARY
Glucose-Capillary: 290 mg/dL — ABNORMAL HIGH (ref 70–99)
Glucose-Capillary: 275 mg/dL — ABNORMAL HIGH (ref 70–99)

## 2021-02-03 MED ORDER — INSULIN PEN NEEDLE 31G X 5 MM MISC
1.0000 [IU] | Freq: Every day | 0 refills | Status: DC
  Filled 2021-02-03: qty 100, 90d supply, fill #0

## 2021-02-03 MED ORDER — NYSTATIN 100000 UNIT/GM EX POWD
1.0000 "application " | Freq: Two times a day (BID) | CUTANEOUS | 0 refills | Status: DC
  Filled 2021-02-03: qty 60, 15d supply, fill #0

## 2021-02-03 MED ORDER — LIP MEDEX EX OINT
1.0000 "application " | TOPICAL_OINTMENT | CUTANEOUS | Status: DC | PRN
  Administered 2021-02-03: 1 via TOPICAL
  Filled 2021-02-03: qty 7

## 2021-02-03 MED ORDER — INSULIN STARTER KIT- PEN NEEDLES (ENGLISH)
1.0000 | Freq: Once | Status: AC
  Administered 2021-02-03: 14:00:00 1
  Filled 2021-02-03: qty 1

## 2021-02-03 MED ORDER — INSULIN ASPART 100 UNIT/ML IJ SOLN
3.0000 [IU] | Freq: Three times a day (TID) | INTRAMUSCULAR | Status: DC
  Administered 2021-02-03: 14:00:00 3 [IU] via SUBCUTANEOUS

## 2021-02-03 MED ORDER — INSULIN DETEMIR 100 UNIT/ML FLEXPEN
25.0000 [IU] | PEN_INJECTOR | Freq: Every day | SUBCUTANEOUS | 0 refills | Status: DC
  Filled 2021-02-03: qty 15, 60d supply, fill #0

## 2021-02-03 MED ORDER — LOPERAMIDE HCL 2 MG PO CAPS
2.0000 mg | ORAL_CAPSULE | ORAL | Status: DC | PRN
  Administered 2021-02-03: 14:00:00 2 mg via ORAL
  Filled 2021-02-03: qty 1

## 2021-02-03 MED ORDER — ZINC OXIDE 40 % EX OINT
TOPICAL_OINTMENT | Freq: Four times a day (QID) | CUTANEOUS | Status: DC
  Filled 2021-02-03: qty 57

## 2021-02-03 MED ORDER — FLUCONAZOLE 150 MG PO TABS
150.0000 mg | ORAL_TABLET | Freq: Once | ORAL | Status: AC
Start: 2021-02-03 — End: 2021-02-03
  Administered 2021-02-03: 14:00:00 150 mg via ORAL
  Filled 2021-02-03: qty 1

## 2021-02-03 MED ORDER — ZINC OXIDE 40 % EX OINT
TOPICAL_OINTMENT | Freq: Four times a day (QID) | CUTANEOUS | 0 refills | Status: DC
  Filled 2021-02-03: qty 56.7, fill #0

## 2021-02-03 NOTE — Evaluation (Signed)
Physical Therapy Evaluation Only Patient Details Name: ALI MCLAURIN MRN: [id number] DOB: January 16, 1956 Today's Date: 02/03/2021  History of Present Illness  Pt is a 65 yo female admitted 12/12 with hyperglycemia and DKA. PMH: type 2 diabetes mellitus, chronic diastolic CHF, OSA, depression, anxiety, CKD stage III, obesity   Clinical Impression  Per chart review and conversation with pt, pt has been bed bound using hoyer lift to get to recliner/ w/c occasionally for 2+ years. Pt is total care for ADLs and mobility, reports HHPT/OT comes ~weekly and will sit her EOB sometimes. Pt reports aides, friends and siblings assist her with care and meals. Pt reports being at baseline with mobility; refuses to sit EOB due to groin pain from yeast infection. Recommend long term/custodial care placement. Will sign off at this time due to pt being at baseline.     Recommendations for follow up therapy are one component of a multi-disciplinary discharge planning process, led by the attending physician.  Recommendations may be updated based on patient status, additional functional criteria and insurance authorization.  Follow Up Recommendations Other (comment) (long term/custodial care)    Assistance Recommended at Discharge Other (comment) (long term/custodial care)  Functional Status Assessment Patient has not had a recent decline in their functional status  Equipment Recommendations  None recommended by PT    Recommendations for Other Services       Precautions / Restrictions Precautions Precautions: Fall Restrictions Weight Bearing Restrictions: No      Mobility  Bed Mobility  General bed mobility comments: pt refuses due to pain in groin    Transfers  General transfer comment: hoyer at baseline    Ambulation/Gait     Stairs            Wheelchair Mobility    Modified Rankin (Stroke Patients Only)       Balance          Pertinent Vitals/Pain Pain Assessment:  0-10 Pain Score: 10-Worst pain ever Pain Location: groin/yeast infection Pain Descriptors / Indicators: Burning Pain Intervention(s): Limited activity within patient's tolerance;Monitored during session    [place] expects to be discharged to:: Private residence Living Arrangements: Alone Available Help at Discharge: Friend(s);Personal care attendant (Personal care attendant: M-T 9am-12 and 6-8pm, Sat and Sun 9am-12; Friend Friday PM - Sunday PM) Type of Home: Apartment Home Access: Ramped entrance  Home Layout: One level [facility] bed;Rollator (4 wheels);Wheelchair - manual      Prior Function Prior Level of Function : Needs assist  Physical Assist : Mobility (physical);ADLs (physical) Mobility (physical): Bed mobility;Transfers ADLs (physical): Grooming;Bathing;Dressing;Toileting;IADLs Mobility Comments: pt reports sits EOB 1x/week with therapy, using hoyer lift to get to lift chair or stays in bed ADLs Comments: pt reports aides complete bed bath, changes pt's diapers, changes pt clothing in bed; pt ordering food or friends provide food     Hand Dominance        Extremity/Trunk Assessment   Upper Extremity Assessment Upper Extremity Assessment: Defer to OT evaluation  Lower Extremity Assessment Lower Extremity Assessment: Generalized weakness;RLE deficits/detail;LLE deficits/detail RLE Deficits / Details: 2-/5 ankle, knee and hip, AROM ~25% RLE Sensation: WNL LLE Deficits / Details: ankle and hip 2-/5, knee 1/5, pt reports "broken knee" LLE Sensation: WNL       Communication   Communication: No difficulties  Cognition Arousal/Alertness: Awake/alert Behavior During Therapy: WFL for tasks assessed/performed Overall Cognitive Status: Within Functional Limits for tasks assessed  General Comments: pt a&o x4  General Comments      Exercises     Assessment/Plan    PT Assessment Patient does not need any further PT services   PT Problem List         PT Treatment Interventions      PT Goals (Current goals can be found in the Care Plan section)  Acute Rehab PT Goals Patient Stated Goal: "get better aides" PT Goal Formulation: All assessment and education complete, DC therapy    Frequency     Barriers to discharge        Co-evaluation               AM-PAC PT "6 Clicks" Mobility  Outcome Measure Help needed turning from your back to your side while in a flat bed without using bedrails?: Total Help needed moving from lying on your back to sitting on the side of a flat bed without using bedrails?: Total Help needed moving to and from a bed to a chair (including a wheelchair)?: Total Help needed standing up from a chair using your arms (e.g., wheelchair or bedside chair)?: Total Help needed to walk in hospital room?: Total Help needed climbing 3-5 steps with a railing? : Total 6 Click Score: 6    End of Session   Activity Tolerance: Patient limited by pain Patient left: in bed;with call bell/phone within reach Nurse Communication: Mobility status PT Visit Diagnosis: Other abnormalities of gait and mobility (R26.89);Muscle weakness (generalized) (M62.81)    Time: 5397-6734 PT Time Calculation (min) (ACUTE ONLY): 20 min   Charges:   PT Evaluation $PT Eval Low Complexity: 1 Low           Tori Gretel Cantu PT, DPT 02/03/21, 12:36 PM

## 2021-02-03 NOTE — TOC Transition Note (Signed)
Transition of Care [facility]) - CM/SW Discharge Note   Patient Details  Name: [name] [name] MRN: [id number] Date of Birth: 19-Dec-1955  Transition of Care [facility]) CM/SW Contact:  Lynnell Catalan, RN Phone Number: 02/03/2021, 12:29 PM   Clinical Narrative:     Pt to dc home via PTAR. Address confirmed with pt. She states there is someone home to open the door for her.      Readmission Risk Interventions Readmission Risk Prevention Plan 02/06/2020 11/08/2019  Transportation Screening Complete Complete  Medication Review [employer]) - Referral to Pharmacy  PCP or Specialist appointment within 3-5 days of discharge Complete Complete  HRI or [place] - Complete  SW Recovery Care/Counseling Consult Complete Complete  Palliative Care Screening Not Applicable Not Applicable  Skilled Nursing Facility Complete Complete  Some recent data might be hidden

## 2021-02-03 NOTE — Progress Notes (Signed)
OT Cancellation Note  Patient Details Name: [name] [name] MRN: [id number] DOB: Feb 25, 1955   Cancelled Treatment:    Reason Eval/Treat Not Completed: OT screened, no needs identified, will sign offPatient is set for D/C home on this date. Patient recommended to have LTC/custodial care per discussion with PT at this time. No skilled OT needs identified with patient at baseline during PT session.   Jackelyn Poling OTR/L, [facility] Acute Rehabilitation Department Office# [phone] Pager# [phone]   02/03/2021, 12:39 PM

## 2021-02-03 NOTE — Progress Notes (Addendum)
Inpatient Diabetes Program Recommendations  AACE/ADA: New Consensus Statement on Inpatient Glycemic Control (2015)  Target Ranges:  Prepandial:   less than 140 mg/dL      Peak postprandial:   less than 180 mg/dL (1-2 hours)      Critically ill patients:  140 - 180 mg/dL   Lab Results  Component Value Date   GLUCAP 290 (H) 02/03/2021   HGBA1C 6.0 (H) 09/08/2020    Review of Glycemic Control DKA Diabetes history: DM 2 Outpatient Diabetes medications: Alogliptin 6.25 mg Daily, Jardiance 12.5 mg Daily, Januvia listed 100 mg Daily (listed), injectable once a week (pt does not know name) Current orders for Inpatient glycemic control:  Semglee 10 units Novolog 0-20 units tid + hs Novolog 3 units tid meal coverage added this am  Last A1c 6% on 7/19  Inpatient Diabetes Program Recommendations:    -  Increase Semglee to 20 units  D/c Jardiance at time of d/c and have pt follow up with prescriber.  A1c pending, however pt needs more insulin for glucose control while inpatient. Glucose 290 this am.  Spoke with pt briefly at bedside as she was drowsy. Pt is on an injectable once a week and is similar to the operation of an insulin pen. Will come back tomorrow to show pt the insulin pen and the differences. Pt reports needing to know what to eat. Briefly discussed carbs and portion sizes. Will review tomorrow when more awake, will also attach information to AVS.  Thanks,  Tama Headings RN, MSN, BC-ADM Inpatient Diabetes Coordinator Team Pager [phone] (8a-5p)

## 2021-02-03 NOTE — Consult Note (Signed)
[facility] Inpatient Consult   02/03/2021  [name] [name] 12-03-1955 [id number]  [facility] CM)  Patient chart reviewed with noted high risk score for unplanned readmission. Assessed for post hospital chronic care management needs. Per review, patient primary provider is not affiliated with [facility] network of physicians. [facility] CM does not follow.  [name], MSN, RN [facility] [employer] [phone]  Toll free office [phone]

## 2021-02-03 NOTE — Progress Notes (Signed)
PT Cancellation Note  Patient Details Name: [name] [name] MRN: [id number] DOB: 04-15-1955   Cancelled Treatment:    Reason Eval/Treat Not Completed: Pain limiting ability to participate. Upon arrival to room, doctor and nurse in hallway discussing pt in tears due to pain; requests therapy check back at later time.   Tori Cristol Engdahl PT, DPT 02/03/21, 9:25 AM

## 2021-02-03 NOTE — Discharge Summary (Addendum)
Physician Discharge Summary  [name] [name] UDJ:[id number] DOB: 1955/11/13 DOA: 02/01/2021  PCP: System, Provider Not In  Admit date: 02/01/2021 Discharge date: 02/03/2021  Admitted From: Home Disposition:  Home   Recommendations for Outpatient Follow-up:  Follow up with PCP (at the [place]) in 1 week. Ha1c is pending at time of discharge.  Repeat BMP as outpatient to follow creatinine.  Has been stable since admission, but elevated from July 2022  Discharge Condition: Stable CODE STATUS: Full  Diet recommendation: Carb modified   Brief/Interim Summary: [name] [name] is a "65 yo female with the past medical history of type 2 diabetes mellitus, hypertension and obesity class 3, non ambulatory state who presented with fatigue and generalized weakness.  Persistent symptoms for about 2 weeks, home health nurse found her significantly hyperglycemic, EMS was called and patient was transferred to the hospital.  On her initial physical examination blood pressure 131/34, heart rate 91, respiratory rate 18, temperature 98.3, oxygen saturation 92% her lungs are clear to auscultation bilaterally, heart S1-S2, present, rhythmic, abdomen was soft positive yeast in the pelvis and axilla, no lower extremity edema.   Sodium 128, potassium 3.7, chloride 85, bicarb 28, glucose 622, BUN 64, creatinine 2.33.  Anion gap 15. White count 7.8, hemoglobin 13.9, hematocrit 43.9, platelets 464 SARS COVID-19 negative.   Chest radiograph no infiltrates.   Patient was placed on insulin drip for glucose control"   Patient was transitioned off insulin drip to subcutaneous insulin.  Seen by diabetes coordinator.  Patient also had significant yeast infection in her groin.  She was given topical miconazole as well as one-time dose of oral Diflucan.  Discharge Diagnoses:  Principal Problem:   DKA (diabetic ketoacidosis) ([place]) Active Problems:   Type 2 diabetes mellitus with stage 3 chronic kidney disease (HCC)    HLD (hyperlipidemia)   Depression   Anxiety state   Essential hypertension   Functional quadriplegia (HCC)   Hyperglycemia   Class 3 obesity ([place])   DKA with uncontrolled type 2 diabetes mellitus, hyperglycemia -Transitioned off insulin drip -Continue Levemir 25 units daily and follow-up closely with outpatient primary care provider for further insulin adjustments -A1c is pending at time of discharge -She should stop Jardiance  Yeast infection in groin -Continue topical nystatin powder as well as one-time dose of oral Diflucan and keep area as dry and clean as able.  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  Question AKI versus progressing CKD -Continue to hold Lasix, spironolactone -Repeat BMP as outpatient  Hyperlipidemia -Continue Lipitor  Discharge Instructions  Discharge Instructions     (HEART FAILURE PATIENTS) Call MD:  Anytime you have any of the following symptoms: 1) 3 pound weight gain in 24 hours or 5 pounds in 1 week 2) shortness of breath, with or without a dry hacking cough 3) swelling in the hands, feet or stomach 4) if you have to sleep on extra pillows at night in order to breathe.   Complete by: As directed    Call MD for:  difficulty breathing, headache or visual disturbances   Complete by: As directed    Call MD for:  extreme fatigue   Complete by: As directed    Call MD for:  persistant dizziness or light-headedness   Complete by: As directed  Call MD for:  persistant nausea and vomiting   Complete by: As directed    Call MD for:  severe uncontrolled pain   Complete by: As directed    Call MD for:  temperature >100.4   Complete by: As directed    Diet - low sodium heart healthy   Complete by: As directed    Diet Carb Modified    Complete by: As directed    Discharge instructions   Complete by: As directed    You were cared for by a hospitalist during your hospital stay. If you have any questions about your discharge medications or the care you received while you were in the hospital after you are discharged, you can call the unit and ask to speak with the hospitalist on call if the hospitalist that took care of you is not available. Once you are discharged, your primary care physician will handle any further medical issues. Please note that NO REFILLS for any discharge medications will be authorized once you are discharged, as it is imperative that you return to your primary care physician (or establish a relationship with a primary care physician if you do not have one) for your aftercare needs so that they can reassess your need for medications and monitor your lab values.   Discharge wound care:   Complete by: As directed    Wash left lateral upper hip wound located in a body fold with soap and water. Place half a piece of Aquacel Advantage over the wound, then cover with a sacral foam dressing. For the labia, inner upper thighs, intergluteal cleft, was with soap and water, pat dry. Sprinkle heavy amount of Antifungal Powder into these areas.   Increase activity slowly   Complete by: As directed       Allergies as of 02/03/2021       Reactions   Lisinopril Swelling   Morphine Itching, Rash   Penicillins Hives, Itching, Nausea And Vomiting, Rash   Has patient had a PCN reaction causing immediate rash, facial/tongue/throat swelling, SOB or lightheadedness with hypotension: No Has patient had a PCN reaction causing severe rash involving mucus membranes or skin necrosis: No Has patient had a PCN reaction that required hospitalization: No Has patient had a PCN reaction occurring within the last 10 years: No If all of the above answers are "NO", then may proceed with Cephalosporin use.        Medication List      STOP taking these medications    Alogliptin Benzoate 12.5 MG Tabs   chlorthalidone 25 MG tablet Commonly known as: HYGROTON   empagliflozin 25 MG Tabs tablet Commonly known as: JARDIANCE   fluconazole 150 MG tablet Commonly known as: DIFLUCAN   furosemide 40 MG tablet Commonly known as: LASIX   furosemide 80 MG tablet Commonly known as: LASIX   potassium chloride SA 20 MEQ tablet Commonly known as: KLOR-CON M   spironolactone 25 MG tablet Commonly known as: ALDACTONE       TAKE these medications    albuterol 108 (90 Base) MCG/ACT inhaler Commonly known as: VENTOLIN HFA Inhale 2 puffs into the lungs every 6 (six) hours as needed for wheezing or shortness of breath.   atorvastatin 10 MG tablet Commonly known as: LIPITOR Take 10 mg by mouth daily.   cetirizine 10 MG tablet Commonly known as: ZYRTEC Take 10 mg by mouth daily.   Ensure Take 237-474 mLs by mouth daily.   escitalopram 10 MG tablet Commonly known  asLoma Sousa Take 5 mg by mouth daily. What changed: Another medication with the same name was removed. Continue taking this medication, and follow the directions you see here.   hydrocortisone 2.5 % lotion Apply 1 application topically 2 (two) times daily. Face itching   hydrOXYzine 25 MG capsule Commonly known as: VISTARIL Take 25 mg by mouth every 6 (six) hours as needed for itching.   insulin detemir 100 UNIT/ML FlexPen Commonly known as: LEVEMIR Inject 25 Units into the skin daily.   Insulin Pen Needle 31G X 5 MM Misc 1 Units by Does not apply route daily.   liver oil-zinc oxide 40 % ointment Commonly known as: DESITIN Apply topically 4 (four) times daily.   midodrine 5 MG tablet Commonly known as: PROAMATINE Take 3 tablets (15 mg total) by mouth 3 (three) times daily with meals.   mirtazapine 15 MG tablet Commonly known as: REMERON Take 15 mg by mouth at bedtime.   nystatin powder Commonly known as: MYCOSTATIN/NYSTOP Apply 1  application topically 2 (two) times daily. To groin and areas of fungal infection   omeprazole 20 MG tablet Commonly known as: PriLOSEC OTC Take 2 tablets (40 mg total) by mouth daily.   ondansetron 4 MG tablet Commonly known as: ZOFRAN Take 1 tablet (4 mg total) by mouth every 6 (six) hours as needed for nausea.   pantoprazole 40 MG tablet Commonly known as: PROTONIX Take 40 mg by mouth daily.   pregabalin 50 MG capsule Commonly known as: Lyrica Take 1 capsule (50 mg total) by mouth 3 (three) times daily.   sitaGLIPtin 100 MG tablet Commonly known as: JANUVIA Take 100 mg by mouth daily.   traMADol 50 MG tablet Commonly known as: ULTRAM Take 50 mg by mouth daily as needed for moderate pain.               Discharge Care Instructions  (From admission, onward)           Start     Ordered   02/03/21 0000  Discharge wound care:       Comments: Wash left lateral upper hip wound located in a body fold with soap and water. Place half a piece of Aquacel Advantage over the wound, then cover with a sacral foam dressing. For the labia, inner upper thighs, intergluteal cleft, was with soap and water, pat dry. Sprinkle heavy amount of Antifungal Powder into these areas.   02/03/21 1215            Follow-up Information     Follow up with your primary care provider at the [facility]. Schedule an appointment as soon as possible for a visit in 1 week(s).                 Allergies  Allergen Reactions   Lisinopril Swelling   Morphine Itching and Rash   Penicillins Hives, Itching, Nausea And Vomiting and Rash    Has patient had a PCN reaction causing immediate rash, facial/tongue/throat swelling, SOB or lightheadedness with hypotension: No Has patient had a PCN reaction causing severe rash involving mucus membranes or skin necrosis: No Has patient had a PCN reaction that required hospitalization: No Has patient had a PCN reaction occurring within the last 10 years: No If  all of the above answers are "NO", then may proceed with Cephalosporin use.      Procedures/Studies: DG Chest 2 View  Result Date: 02/01/2021 CLINICAL DATA:  Shortness of breath. EXAM: CHEST - 2 VIEW COMPARISON:  Chest x-ray dated August 25, 2020. FINDINGS: Suboptimal lateral view due to patient body habitus. The heart size and mediastinal contours are within normal limits. Both lungs are clear. The visualized skeletal structures are unremarkable. IMPRESSION: No active cardiopulmonary disease. Electronically Signed   By: Titus Dubin M.D.   On: 02/01/2021 16:01     Discharge Exam: Vitals:   02/03/21 0200 02/03/21 0607  BP: (!) 112/59 (!) 141/59  Pulse: 66 65  Resp: 17 17  Temp: 98 F (36.7 C) 97.7 F (36.5 C)  SpO2: 95% 95%    General: Pt is alert, awake, not in acute distress Cardiovascular: RRR, S1/S2 +, no edema Respiratory: CTA bilaterally, no wheezing, no rhonchi, no respiratory distress, no conversational dyspnea  Abdominal: Soft, NT, ND, bowel sounds + Extremities: no edema, no cyanosis Skin: Difficult assessment due to body habitus, patient with white miconazole cream under pannus, inner thigh    The results of significant diagnostics from this hospitalization (including imaging, microbiology, ancillary and laboratory) are listed below for reference.     Microbiology: Recent Results (from the past 240 hour(s))  Resp Panel by RT-PCR (Flu A&B, Covid) Nasopharyngeal Swab     Status: None   Collection Time: 02/01/21  4:07 PM   Specimen: Nasopharyngeal Swab; Nasopharyngeal(NP) swabs in vial transport medium  Result Value Ref Range Status   SARS Coronavirus 2 by RT PCR NEGATIVE NEGATIVE Final    Comment: (NOTE) SARS-CoV-2 target nucleic acids are NOT DETECTED.  The SARS-CoV-2 RNA is generally detectable in upper respiratory specimens during the acute phase of infection. The lowest concentration of SARS-CoV-2 viral copies this assay can detect is 138 copies/mL. A  negative result does not preclude SARS-Cov-2 infection and should not be used as the sole basis for treatment or other patient management decisions. A negative result may occur with  improper specimen collection/handling, submission of specimen other than nasopharyngeal swab, presence of viral mutation(s) within the areas targeted by this assay, and inadequate number of viral copies(<138 copies/mL). A negative result must be combined with clinical observations, patient history, and epidemiological information. The expected result is Negative.  Fact Sheet for Patients:  EntrepreneurPulse.com.au  Fact Sheet for Healthcare Providers:  IncredibleEmployment.be  This test is no t yet approved or cleared by the [place] FDA and  has been authorized for detection and/or diagnosis of SARS-CoV-2 by FDA under an Emergency Use Authorization (EUA). This EUA will remain  in effect (meaning this test can be used) for the duration of the COVID-19 declaration under Section 564(b)(1) of the Act, 21 U.S.C.section 360bbb-3(b)(1), unless the authorization is terminated  or revoked sooner.       Influenza A by PCR NEGATIVE NEGATIVE Final   Influenza B by PCR NEGATIVE NEGATIVE Final    Comment: (NOTE) The Xpert Xpress SARS-CoV-2/FLU/RSV plus assay is intended as an aid in the diagnosis of influenza from Nasopharyngeal swab specimens and should not be used as a sole basis for treatment. Nasal washings and aspirates are unacceptable for Xpert Xpress SARS-CoV-2/FLU/RSV testing.  Fact Sheet for Patients: EntrepreneurPulse.com.au  Fact Sheet for Healthcare Providers: IncredibleEmployment.be  This test is not yet approved or cleared by the [place] FDA and has been authorized for detection and/or diagnosis of SARS-CoV-2 by FDA under an Emergency Use Authorization (EUA). This EUA will remain in effect (meaning this test can  be used) for the duration of the COVID-19 declaration under Section 564(b)(1) of the Act, 21 U.S.C. section 360bbb-3(b)(1), unless the authorization is terminated or  revoked.  Performed at [facility], [place] [address], [place], [place] [address]   MRSA Next Gen by PCR, Nasal     Status: None   Collection Time: 02/02/21  1:57 AM   Specimen: Nasal Mucosa; Nasal Swab  Result Value Ref Range Status   MRSA by PCR Next Gen NOT DETECTED NOT DETECTED Final    Comment: (NOTE) The GeneXpert MRSA Assay (FDA approved for NASAL specimens only), is one component of a comprehensive MRSA colonization surveillance program. It is not intended to diagnose MRSA infection nor to guide or monitor treatment for MRSA infections. Test performance is not FDA approved in patients less than 43 years old. Performed at [facility], [place] [address] [place], [place] [address]      Labs: BNP (last 3 results) Recent Labs    07/25/20 1432 08/25/20 1800  BNP 119.6* 78.4   Basic Metabolic Panel: Recent Labs  Lab 02/01/21 1334 02/02/21 0310 02/03/21 0536  NA 128* 135 136  K 3.7 3.3* 4.0  CL 85* 93* 96*  CO2 28 28 27   GLUCOSE 622* 204* 277*  BUN 64* 62* 59*  CREATININE 2.33* 2.09* 2.16*  CALCIUM 9.7 9.6 9.8   Liver Function Tests: Recent Labs  Lab 02/02/21 0310  AST 13*  ALT 11  ALKPHOS 127*  BILITOT 0.5  PROT 7.6  ALBUMIN 3.2*   No results for input(s): LIPASE, AMYLASE in the last 168 hours. No results for input(s): AMMONIA in the last 168 hours. CBC: Recent Labs  Lab 02/01/21 1334 02/02/21 0310  WBC 7.8 8.4  HGB 13.9 13.9  HCT 43.9 44.4  MCV 79.7* 79.9*  PLT 464* 428*   Cardiac Enzymes: No results for input(s): CKTOTAL, CKMB, CKMBINDEX, TROPONINI in the last 168 hours. BNP: Invalid input(s): POCBNP CBG: Recent Labs  Lab 02/02/21 0751 02/02/21 1115 02/02/21 1701 02/02/21 2147 02/03/21 0845  GLUCAP 164* 279* 373* 359* 290*    D-Dimer No results for input(s): DDIMER in the last 72 hours. Hgb A1c No results for input(s): HGBA1C in the last 72 hours. Lipid Profile No results for input(s): CHOL, HDL, LDLCALC, TRIG, CHOLHDL, LDLDIRECT in the last 72 hours. Thyroid function studies No results for input(s): TSH, T4TOTAL, T3FREE, THYROIDAB in the last 72 hours.  Invalid input(s): FREET3 Anemia work up No results for input(s): VITAMINB12, FOLATE, FERRITIN, TIBC, IRON, RETICCTPCT in the last 72 hours. Urinalysis    Component Value Date/Time   COLORURINE YELLOW 09/08/2020 0247   APPEARANCEUR HAZY (A) 09/08/2020 0247   LABSPEC 1.013 09/08/2020 0247   PHURINE 5.0 09/08/2020 0247   GLUCOSEU NEGATIVE 09/08/2020 0247   HGBUR MODERATE (A) 09/08/2020 0247   BILIRUBINUR NEGATIVE 09/08/2020 0247   KETONESUR 5 (A) 09/08/2020 0247   PROTEINUR NEGATIVE 09/08/2020 0247   UROBILINOGEN 0.2 06/28/2014 1500   NITRITE NEGATIVE 09/08/2020 0247   LEUKOCYTESUR SMALL (A) 09/08/2020 0247   Sepsis Labs Invalid input(s): PROCALCITONIN,  WBC,  LACTICIDVEN Microbiology Recent Results (from the past 240 hour(s))  Resp Panel by RT-PCR (Flu A&B, Covid) Nasopharyngeal Swab     Status: None   Collection Time: 02/01/21  4:07 PM   Specimen: Nasopharyngeal Swab; Nasopharyngeal(NP) swabs in vial transport medium  Result Value Ref Range Status   SARS Coronavirus 2 by RT PCR NEGATIVE NEGATIVE Final    Comment: (NOTE) SARS-CoV-2 target nucleic acids are NOT DETECTED.  The SARS-CoV-2 RNA is generally detectable in upper respiratory specimens during the acute phase of infection. The lowest concentration of  SARS-CoV-2 viral copies this assay can detect is 138 copies/mL. A negative result does not preclude SARS-Cov-2 infection and should not be used as the sole basis for treatment or other patient management decisions. A negative result may occur with  improper specimen collection/handling, submission of specimen other than nasopharyngeal  swab, presence of viral mutation(s) within the areas targeted by this assay, and inadequate number of viral copies(<138 copies/mL). A negative result must be combined with clinical observations, patient history, and epidemiological information. The expected result is Negative.  Fact Sheet for Patients:  EntrepreneurPulse.com.au  Fact Sheet for Healthcare Providers:  IncredibleEmployment.be  This test is no t yet approved or cleared by the [place] FDA and  has been authorized for detection and/or diagnosis of SARS-CoV-2 by FDA under an Emergency Use Authorization (EUA). This EUA will remain  in effect (meaning this test can be used) for the duration of the COVID-19 declaration under Section 564(b)(1) of the Act, 21 U.S.C.section 360bbb-3(b)(1), unless the authorization is terminated  or revoked sooner.       Influenza A by PCR NEGATIVE NEGATIVE Final   Influenza B by PCR NEGATIVE NEGATIVE Final    Comment: (NOTE) The Xpert Xpress SARS-CoV-2/FLU/RSV plus assay is intended as an aid in the diagnosis of influenza from Nasopharyngeal swab specimens and should not be used as a sole basis for treatment. Nasal washings and aspirates are unacceptable for Xpert Xpress SARS-CoV-2/FLU/RSV testing.  Fact Sheet for Patients: EntrepreneurPulse.com.au  Fact Sheet for Healthcare Providers: IncredibleEmployment.be  This test is not yet approved or cleared by the [place] FDA and has been authorized for detection and/or diagnosis of SARS-CoV-2 by FDA under an Emergency Use Authorization (EUA). This EUA will remain in effect (meaning this test can be used) for the duration of the COVID-19 declaration under Section 564(b)(1) of the Act, 21 U.S.C. section 360bbb-3(b)(1), unless the authorization is terminated or revoked.  Performed at [facility], [place] [address] [place], [place] [address]    MRSA Next Gen by PCR, Nasal     Status: None   Collection Time: 02/02/21  1:57 AM   Specimen: Nasal Mucosa; Nasal Swab  Result Value Ref Range Status   MRSA by PCR Next Gen NOT DETECTED NOT DETECTED Final    Comment: (NOTE) The GeneXpert MRSA Assay (FDA approved for NASAL specimens only), is one component of a comprehensive MRSA colonization surveillance program. It is not intended to diagnose MRSA infection nor to guide or monitor treatment for MRSA infections. Test performance is not FDA approved in patients less than 38 years old. Performed at [facility], [place] [address] [place], [place] [address]      Patient was seen and examined on the day of discharge and was found to be in stable condition. Time coordinating discharge: 35 minutes including assessment and coordination of care, as well as examination of the patient.   SIGNED:  Dessa Phi, DO [facility] 02/03/2021, 12:19 PM

## 2021-02-04 LAB — HEMOGLOBIN A1C

## 2021-03-25 IMAGING — XA IR ANGIO/VISCERAL SELECTIVE EA VESSEL WO/W FLUSH
11 of 12 series · 13 of 24 positions shown · IV contrast (IODINE)
Comparison: none

INDICATION: Bleeding duodenal ulcer by endoscopy, blood-loss anemia

[Series 1: care single · 1 of 1 slices shown]
[im 1/1]
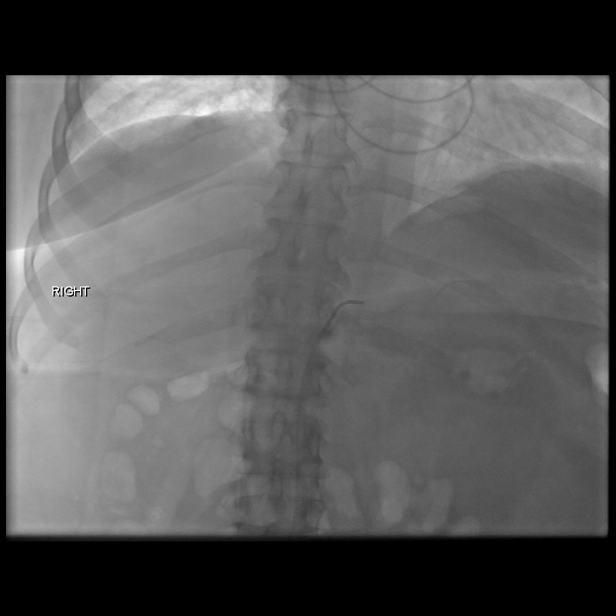

[Series 2: body 4 · 1 of 22 frames shown (1 of 9)]
[frame 19/22]
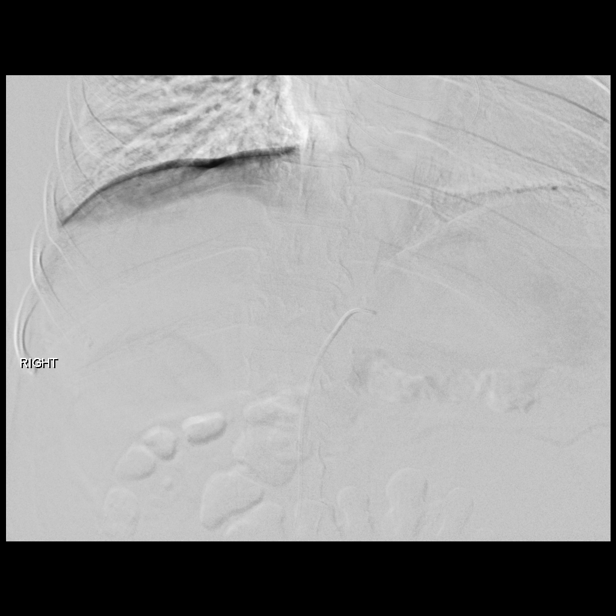

[Series 4: body 4 · 1 of 22 frames shown (2 of 9)]
[frame 4/22]
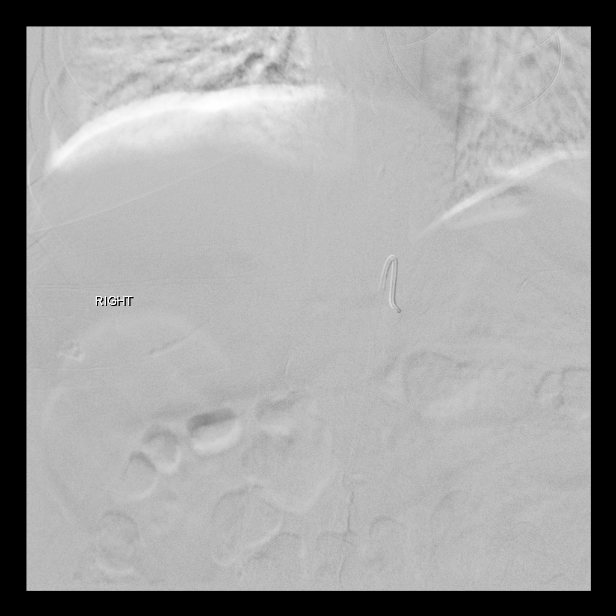

[Series 5: body 4 · 1 of 83 frames shown (3 of 9)]
[frame 13/83]
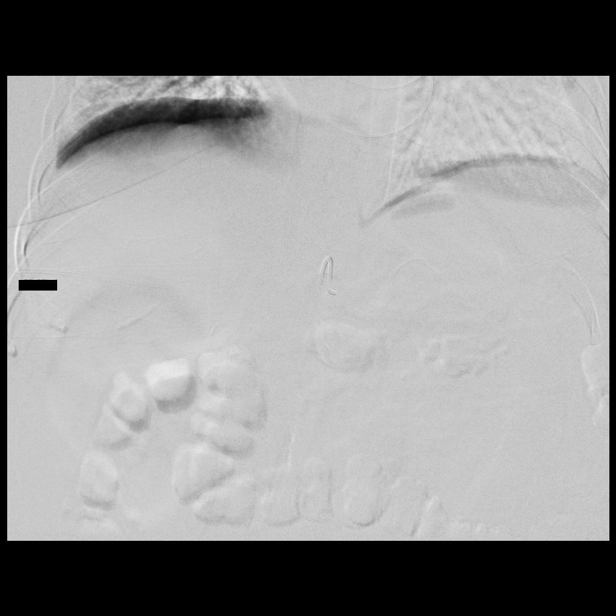

[Series 6: body 4 · 1 of 52 frames shown (4 of 9)]
[frame 8/52]
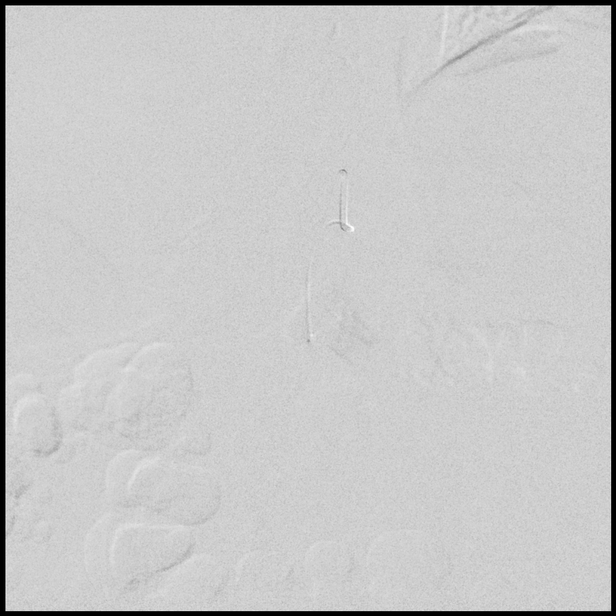

[Series 7: body 4 · 1 of 43 frames shown (5 of 9)]
[frame 7/43]
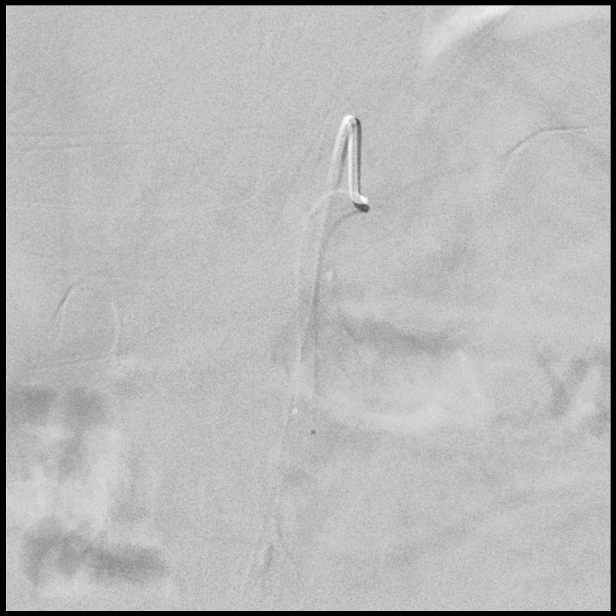

[Series 9: body 4 · 2 of 75 frames shown (6 of 9)]
[frame 37/75]
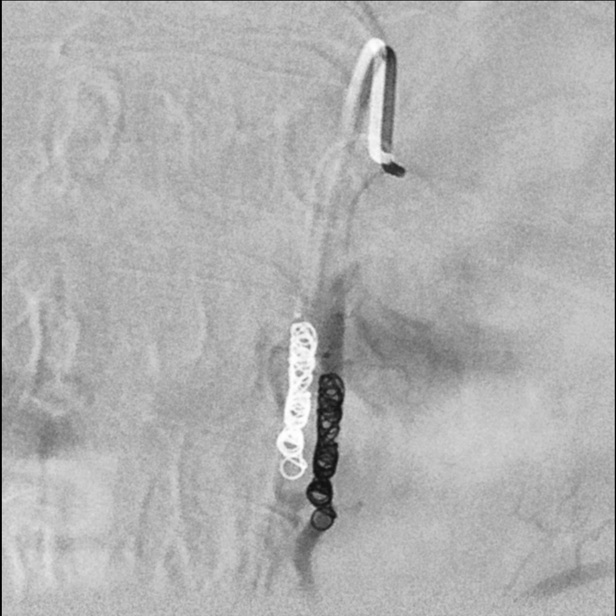
[frame 64/75]
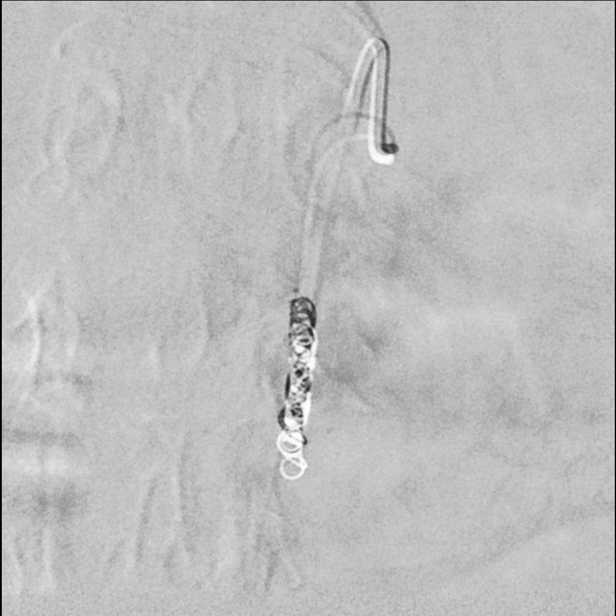

[Series 10: body 4 · 1 of 91 frames shown (7 of 9)]
[frame 78/91]
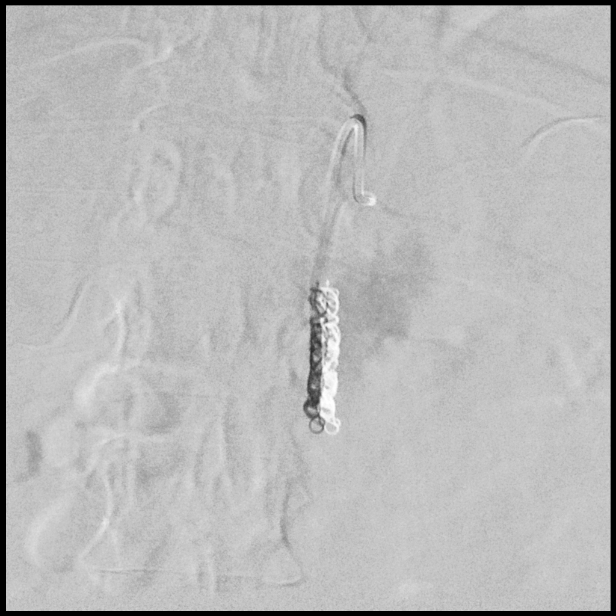

[Series 11: body 4 · 1 of 34 frames shown (8 of 9)]
[frame 29/34]
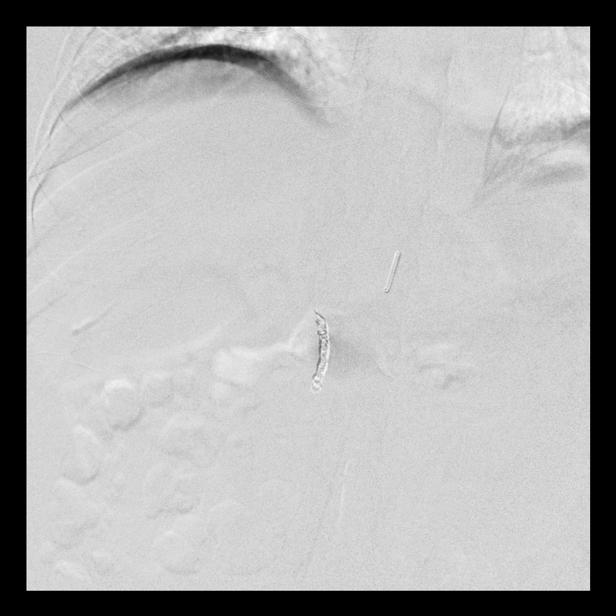

[Series 12: body 4 · 1 of 26 frames shown (9 of 9)]
[frame 26/26]
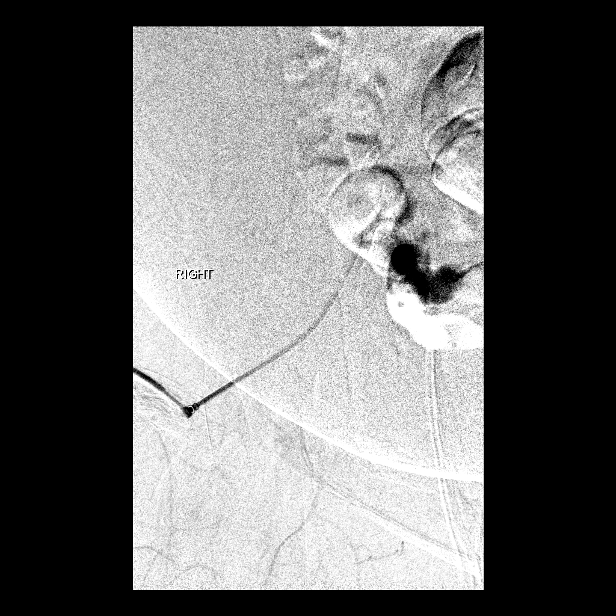

[Series 300: ir radiologist eval & mgmt · 2 of 9 slices shown]
[im 5/9]
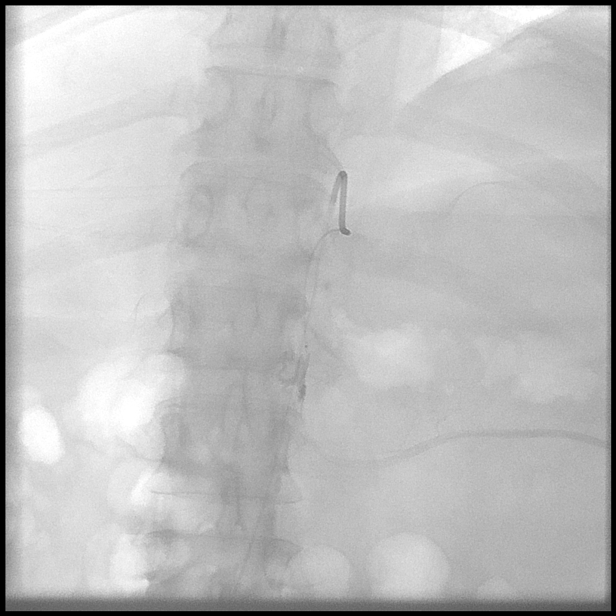
[im 9/9]
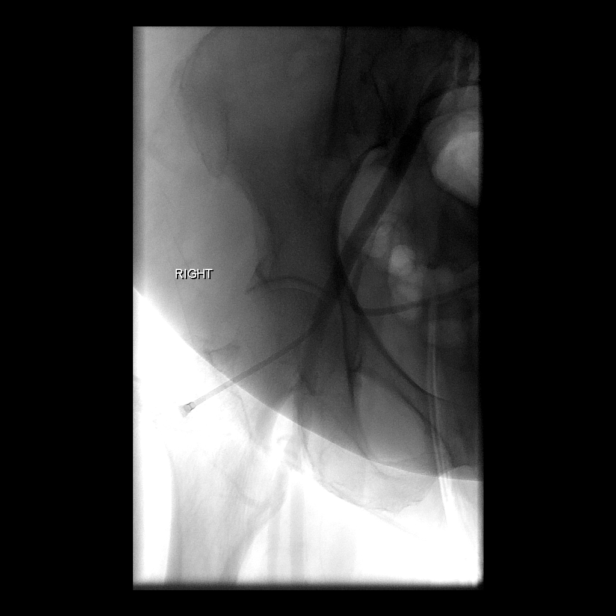

[13 of 24 positions shown; findings below may reference images not displayed]

EXAM:
ULTRASOUND GUIDANCE FOR VASCULAR ACCESS

CELIAC AND GDA CATHETERIZATIONS AND ANGIOGRAMS

GDA MICRO COIL EMBOLIZATION

MEDICATIONS:
1% lidocaine local.

ANESTHESIA/SEDATION:
Moderate (conscious) sedation was employed during this procedure. A
total of Versed 1.5 mg and Fentanyl 50 mcg was administered
intravenously.

Moderate Sedation Time: 40 minutes. The patient's level of
consciousness and vital signs were monitored continuously by
radiology nursing throughout the procedure under my direct
supervision.

CONTRAST:  70 cc omni 300

FLUOROSCOPY TIME:  Fluoroscopy Time: 9 minutes 0 seconds (3,066
mGy).

COMPLICATIONS:
None immediate.



Under sterile conditions and local anesthesia, ultrasound
micropuncture access performed of the right common femoral artery.
Images obtained for documentation. Five French sheath inserted over
a guidewire. C2 catheter advanced and utilized to select the celiac
origin. Celiac angiogram performed.

Celiac: The celiac origin is widely patent. The splenic, left
gastric, hepatic vasculature, and GDA are all visualized and patent.
No active arterial GI bleeding demonstrated. However there are
dilated branches within the pancreaticoduodenal arcade which are
suspicious for sites of bleeding given the known large duodenal
ulcer.

Renegade STC microcatheter and a GT single angle Glidewire were
utilized to advance the access into the GDA. Selective GDA angiogram
performed.

GDA: The GDA and gastroepiploic vasculature are all patent. Again,
no active arterial bleeding noted but there is 1 dilated tortuous
branch from the lower GDA which is suspicious for a bleeding source.

GDA micro coil embolization: Microcatheter was advanced to the
dilated pancreaticoduodenal branch. 4 and 5 mm interlock micro coils
were deployed for embolization. Following embolization, the GDA is
occluded. Repeat celiac angiogram confirms preserved patency of the
hepatic vasculature and GDA occlusion.

Catheter removed.

Right common femoral artery access closed with the ExoSeal device
and manual compression. No immediate complication. Patient tolerated
the procedure well.
IMPRESSION: Dilated branches of the pancreaticoduodenal arcade noted off of the
mid to distal GDA. Although no active arterial bleeding demonstrated
preventative GDA micro coil embolization performed as detailed above
for the known large duodenal ulcer by endoscopy.

## 2021-04-01 ENCOUNTER — Other Ambulatory Visit: Payer: Self-pay

## 2021-04-01 ENCOUNTER — Other Ambulatory Visit: Payer: Medicare Other

## 2021-04-01 DIAGNOSIS — Z515 Encounter for palliative care: Secondary | ICD-10-CM

## 2021-04-04 NOTE — Progress Notes (Signed)
COMMUNITY PALLIATIVE CARE SW NOTE  PATIENT NAME: [name] [name] DOB: August 19, 1955 MRN: [id number]  PRIMARY CARE PROVIDER: System, Provider Not In  RESPONSIBLE PARTY:  Acct ID - Guarantor Home Phone Work Phone Relationship Acct Type  [id number] [name], [name] [phone]  Self P/F     [name], [name] [address]   Due to the COVID-19 crisis, this virtual check-in visit was done via telephone from my office and it was initiated and consent by this patient and or family  [place] (1:49 pm-1:55 pm)  PC SW completed a telephonic visit with patient to assess her needs, comfort and status. Patient reported that she has not been doing her best, but was stable overall. She report that she continues to go to the [place], but would like to continue with palliative care support. She stated that she feels that also have some social work needs. SW inquired if she had any specific needs. She advised that she would like to schedule a visit with a provider, but would like to talk with SW following that. SW scheduled a visit with Dr. Mariea Clonts for patient ton 04/15/21 @ 11:30 am.    [name], LCSW

## 2021-04-15 ENCOUNTER — Other Ambulatory Visit: Payer: Self-pay

## 2021-04-15 ENCOUNTER — Other Ambulatory Visit: Payer: Medicare Other | Admitting: Internal Medicine

## 2021-04-15 DIAGNOSIS — G4733 Obstructive sleep apnea (adult) (pediatric): Secondary | ICD-10-CM

## 2021-04-15 DIAGNOSIS — H6001 Abscess of right external ear: Secondary | ICD-10-CM

## 2021-04-15 DIAGNOSIS — Z515 Encounter for palliative care: Secondary | ICD-10-CM

## 2021-04-15 DIAGNOSIS — R532 Functional quadriplegia: Secondary | ICD-10-CM
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
## 2021-04-16 MED ORDER — AZITHROMYCIN 250 MG PO TABS: ORAL_TABLET | ORAL | 0 refills | Status: AC

## 2021-04-17 ENCOUNTER — Encounter: Payer: Self-pay | Admitting: Internal Medicine

## 2021-04-21 ENCOUNTER — Emergency Department (HOSPITAL_BASED_OUTPATIENT_CLINIC_OR_DEPARTMENT_OTHER)
Admission: EM | Admit: 2021-04-21 | Discharge: 2021-04-21 | Disposition: A | Discharge status: Home / Self Care | Payer: No Typology Code available for payment source | Source: Home / Self Care | Attending: Emergency Medicine | Admitting: Emergency Medicine

## 2021-04-21 ENCOUNTER — Encounter (HOSPITAL_COMMUNITY): Payer: Self-pay

## 2021-04-21 ENCOUNTER — Other Ambulatory Visit: Payer: Self-pay

## 2021-04-21 ENCOUNTER — Emergency Department (HOSPITAL_COMMUNITY)
Admission: EM | Admit: 2021-04-21 | Discharge: 2021-04-21 | Disposition: A | Discharge status: Home / Self Care | Payer: No Typology Code available for payment source | Attending: Emergency Medicine | Admitting: Emergency Medicine

## 2021-04-21 DIAGNOSIS — R739 Hyperglycemia, unspecified: Secondary | ICD-10-CM | POA: Insufficient documentation

## 2021-04-21 DIAGNOSIS — M7989 Other specified soft tissue disorders: Secondary | ICD-10-CM | POA: Diagnosis present

## 2021-04-21 DIAGNOSIS — M79604 Pain in right leg: Secondary | ICD-10-CM | POA: Insufficient documentation

## 2021-04-21 DIAGNOSIS — R609 Edema, unspecified: Secondary | ICD-10-CM | POA: Diagnosis not present

## 2021-04-21 DIAGNOSIS — M79605 Pain in left leg: Secondary | ICD-10-CM | POA: Insufficient documentation

## 2021-04-21 DIAGNOSIS — R600 Localized edema: Secondary | ICD-10-CM | POA: Diagnosis not present

## 2021-04-21 DIAGNOSIS — R0902 Hypoxemia: Secondary | ICD-10-CM | POA: Diagnosis not present

## 2021-04-21 DIAGNOSIS — Z743 Need for continuous supervision: Secondary | ICD-10-CM | POA: Diagnosis not present

## 2021-04-21 LAB — CBC WITH DIFFERENTIAL/PLATELET
Abs Immature Granulocytes: 0.02 10*3/uL (ref 0.00–0.07)
Basophils Absolute: 0 10*3/uL (ref 0.0–0.1)
Basophils Relative: 1 %
Eosinophils Absolute: 0.3 10*3/uL (ref 0.0–0.5)
Eosinophils Relative: 4 %
HCT: 43.8 % (ref 36.0–46.0)
Hemoglobin: 13.3 g/dL (ref 12.0–15.0)
Immature Granulocytes: 0 %
Lymphocytes Relative: 28 %
Lymphs Abs: 1.9 10*3/uL (ref 0.7–4.0)
MCH: 25.2 pg — ABNORMAL LOW (ref 26.0–34.0)
MCHC: 30.4 g/dL (ref 30.0–36.0)
MCV: 83 fL (ref 80.0–100.0)
Monocytes Absolute: 0.5 10*3/uL (ref 0.1–1.0)
Monocytes Relative: 8 %
Neutro Abs: 4 10*3/uL (ref 1.7–7.7)
Neutrophils Relative %: 59 %
Platelets: 384 10*3/uL (ref 150–400)
RBC: 5.28 MIL/uL — ABNORMAL HIGH (ref 3.87–5.11)
RDW: 17.8 % — ABNORMAL HIGH (ref 11.5–15.5)
WBC: 6.7 10*3/uL (ref 4.0–10.5)
nRBC: 0 % (ref 0.0–0.2)

## 2021-04-21 LAB — COMPREHENSIVE METABOLIC PANEL
ALT: 19 U/L (ref 0–44)
AST: 24 U/L (ref 15–41)
Albumin: 3.3 g/dL — ABNORMAL LOW (ref 3.5–5.0)
Alkaline Phosphatase: 185 U/L — ABNORMAL HIGH (ref 38–126)
Anion gap: 4 — ABNORMAL LOW (ref 5–15)
BUN: 17 mg/dL (ref 8–23)
CO2: 25 mmol/L (ref 22–32)
Calcium: 8.8 mg/dL — ABNORMAL LOW (ref 8.9–10.3)
Chloride: 104 mmol/L (ref 98–111)
Creatinine, Ser: 0.97 mg/dL (ref 0.44–1.00)
GFR, Estimated: >60 mL/min (ref >60)
Glucose, Bld: 397 mg/dL — ABNORMAL HIGH (ref 70–99)
Potassium: 4.8 mmol/L (ref 3.5–5.1)
Sodium: 133 mmol/L — ABNORMAL LOW (ref 135–145)
Total Bilirubin: 0.5 mg/dL (ref 0.3–1.2)
Total Protein: 7.2 g/dL (ref 6.5–8.1)

## 2021-04-21 LAB — CBG MONITORING, ED: Glucose-Capillary: 298 mg/dL — ABNORMAL HIGH (ref 70–99)

## 2021-04-21 MED ORDER — LIDOCAINE HCL (PF) 1 % IJ SOLN
5.0000 mL | Freq: Once | INTRAMUSCULAR | Status: DC
Start: 2021-04-21 — End: 2021-04-21

## 2021-04-21 MED ORDER — TETANUS-DIPHTH-ACELL PERTUSSIS 5-2.5-18.5 LF-MCG/0.5 IM SUSY: 0.5000 mL | PREFILLED_SYRINGE | Freq: Once | INTRAMUSCULAR | Status: DC

## 2021-04-21 MED ORDER — INSULIN ASPART 100 UNIT/ML IJ SOLN
12.0000 [IU] | Freq: Once | INTRAMUSCULAR | Status: AC
  Administered 2021-04-21: 12 [IU] via SUBCUTANEOUS
  Filled 2021-04-21: qty 0.12

## 2021-04-21 NOTE — Progress Notes (Signed)
Bilateral lower extremity venous duplex has been completed. ?Preliminary results can be found in CV Proc through chart review.  ?Results were given to Dr. Almyra Free. ? ?04/21/21 4:19 PM ?Carlos Levering RVT   ?

## 2021-04-21 NOTE — ED Notes (Signed)
[facility] called  

## 2021-04-21 NOTE — ED Notes (Signed)
Pt with vascular tech.  ?

## 2021-04-21 NOTE — Discharge Instructions (Signed)
Call your primary care doctor or specialist as discussed in the next 2-3 days.   Return immediately back to the ER if:  Your symptoms worsen within the next 12-24 hours. You develop new symptoms such as new fevers, persistent vomiting, new pain, shortness of breath, or new weakness or numbness, or if you have any other concerns.  

## 2021-04-21 NOTE — ED Notes (Signed)
Patient was given a cup of coffee. 

## 2021-04-21 NOTE — ED Triage Notes (Signed)
Pt BIBA from home c/o bilateral leg pain and increased swelling, R>L. Unable to participate in PT today. Also reports sx from high blood sugar, neuropathy, blurry vision. States she ran out of pregaba and hctz. On abx for abcess until Tuesday. ? ?BP 160/86 ?HR 71 ?SpO2 92% RA uses O2 PRN ?CBG 543 reports was 400s yesterday and 300s this morning ? ?

## 2021-04-21 NOTE — ED Notes (Signed)
Patient gone for testing. Unable to update vitals.  ?

## 2021-04-21 NOTE — ED Notes (Addendum)
Patient was cleaned up and a new brief was placed.  ?

## 2021-04-21 NOTE — ED Notes (Signed)
Pt cleaned and given pericare. Clean brief on patient.  ?

## 2021-04-21 NOTE — ED Provider Notes (Signed)
[facility] DEPT Provider Note   CSN: [id number] Arrival date & time: 04/21/21  1359     History  Chief Complaint  Patient presents with   Leg Pain    [name] [name] is a 66 y.o. female.  Patient presents ER chief complaint of bilateral leg swelling right worse than left.  Sure how long its been going on but noticed it for the past 2 to 3 days.  She saw her primary care doctor today as well as the physical therapist today.  She was advised by her physical therapist today to go to the ER to get checked out so she presents here denies any chest pain or shortness of breath above her baseline.  No fever no cough no vomiting or diarrhea.      Home Medications Prior to Admission medications   Medication Sig Start Date End Date Taking? Authorizing Provider  acetaminophen (TYLENOL) 500 MG tablet Take 1 tablet by mouth every 6 (six) hours. 07/24/20   [provider]  albuterol (VENTOLIN HFA) 108 (90 Base) MCG/ACT inhaler Inhale 2 puffs into the lungs every 6 (six) hours as needed for wheezing or shortness of breath. 08/06/20   Aline August, MD  atorvastatin (LIPITOR) 10 MG tablet Take 10 mg by mouth daily.    [provider]  azithromycin (ZITHROMAX) 250 MG tablet Take 2 tablets on day 1, then 1 tablet daily on days 2 through 5 04/16/21 04/21/21  Reed, Tiffany L, DO  BELBUCA 300 MCG FILM Take 1 strip by mouth 2 (two) times daily. 02/25/21   [provider]  carbamide peroxide (DEBROX) 6.5 % OTIC solution INSTILL 5-10 DROPS IN LEFT EAR AT BEDTIME FOR WAX REMOVAL 11/20/20   [provider]  cetirizine (ZYRTEC) 10 MG tablet Take 10 mg by mouth daily. 11/20/20   [provider]  diclofenac Sodium (VOLTAREN) 1 % GEL APPLY 4 GRAMS TO AFFECTED AREA FOUR TIMES A DAY AS NEEDED FOR KNEE PAIN 11/06/20   [provider]  Ensure (ENSURE) Take 237-474 mLs by mouth daily.    [provider]  escitalopram (LEXAPRO) 10 MG  tablet Take 5 mg by mouth daily.    [provider]  furosemide (LASIX) 40 MG tablet TAKE ONE TABLET BY MOUTH MONDAY, WEDNESDAY AND FRIDAY (FLUID PILL) FOR EDEMA 02/09/21   [provider]  glucose-Vitamin C 4-0.006 GM CHEW chewable tablet CHEW FOUR TABLETS BY MOUTH  AS NEEDED (REPEAT EVERY 15 MINUTES IF BLOOD SUGAR LESS THAN 70) FOR BLOOD SUGAR LESS THAN 70 02/19/21   [provider]  hydrocortisone 2.5 % lotion Apply 1 application topically 2 (two) times daily. Face itching 08/28/19   [provider]  hydrOXYzine (VISTARIL) 25 MG capsule Take 25 mg by mouth every 6 (six) hours as needed for itching. 10/30/20   [provider]  insulin detemir (LEVEMIR) 100 UNIT/ML FlexPen Inject 25 Units into the skin daily. 02/03/21   Dessa Phi, DO  Insulin Pen Needle 31G X 5 MM MISC Use as directed daily 02/03/21   Dessa Phi, DO  liver oil-zinc oxide (DESITIN) 40 % ointment Apply topically 4 (four) times daily. 02/03/21   Dessa Phi, DO  midodrine (PROAMATINE) 5 MG tablet Take 3 tablets (15 mg total) by mouth 3 (three) times daily with meals. 08/06/20   Aline August, MD  mirtazapine (REMERON) 15 MG tablet Take 15 mg by mouth at bedtime.    [provider]  mirtazapine (REMERON) 15 MG tablet  Take 1 tablet by mouth at bedtime. 11/06/20   [provider]  nystatin (MYCOSTATIN/NYSTOP) powder Apply to affected area 2 (two) times daily to groin and areas of fungal infection 02/03/21   Dessa Phi, DO  nystatin cream (MYCOSTATIN) APPLY AS DIRECTED TO AFFECTED AREA TWICE A DAY FOR FUNGAL RASH 01/13/21   [provider]  omeprazole (PRILOSEC OTC) 20 MG tablet Take 2 tablets (40 mg total) by mouth daily. 08/28/20 08/28/21  Little Ishikawa, MD  ondansetron (ZOFRAN) 4 MG tablet Take 1 tablet (4 mg total) by mouth every 6 (six) hours as needed for nausea. 08/28/20   Little Ishikawa, MD  pantoprazole (PROTONIX) 40 MG tablet Take 40 mg by  mouth daily.    [provider]  potassium chloride SA (KLOR-CON M) 20 MEQ tablet TAKE ONE TABLET BY MOUTH MONDAY, WEDNESDAY AND FRIDAY FOR POTASSIUM REPLACEMEMT (TAKE WITH FOOD) 02/12/21   [provider]  pregabalin (LYRICA) 50 MG capsule Take 1 capsule (50 mg total) by mouth 3 (three) times daily. 09/25/20 09/25/21  Deatra James, MD  Semaglutide,0.25 or 0.5MG /DOS, 2 MG/1.5ML SOPN INJECT 0.5MG  SUBCUTANEOUSLY ONCE WEEKLY FOR DIABETES 02/24/21   [provider]  sitaGLIPtin (JANUVIA) 100 MG tablet Take 100 mg by mouth daily.    [provider]  traMADol (ULTRAM) 50 MG tablet Take 50 mg by mouth daily as needed for moderate pain.    [provider]      Allergies    Lisinopril, Morphine, and Penicillins    Review of Systems   Review of Systems  Constitutional:  Negative for fever.  HENT:  Negative for ear pain.   Eyes:  Negative for pain.  Respiratory:  Negative for cough.   Cardiovascular:  Negative for chest pain.  Gastrointestinal:  Negative for abdominal pain.  Genitourinary:  Negative for flank pain.  Musculoskeletal:  Negative for back pain.  Skin:  Negative for rash.  Neurological:  Negative for headaches.   Physical Exam Updated Vital Signs BP 128/68 (BP Location: Right Wrist)    Pulse 77    Temp 98.2 F (36.8 C) (Oral)    Resp 18    Ht 5\' 2"  (1.575 m)    Wt 131.5 kg    SpO2 97%    BMI 53.04 kg/m  Physical Exam Constitutional:      General: She is not in acute distress.    Appearance: Normal appearance.  HENT:     Head: Normocephalic.     Nose: Nose normal.  Eyes:     Extraocular Movements: Extraocular movements intact.  Cardiovascular:     Rate and Rhythm: Normal rate.  Pulmonary:     Effort: Pulmonary effort is normal.  Musculoskeletal:        General: Normal range of motion.     Cervical back: Normal range of motion.     Right lower leg: Edema present.  Neurological:     General: No focal deficit present.      Mental Status: She is alert. Mental status is at baseline.    ED Results / Procedures / Treatments   Labs (all labs ordered are listed, but only abnormal results are displayed) Labs Reviewed  CBC WITH DIFFERENTIAL/PLATELET - Abnormal; Notable for the following components:      Result Value   RBC 5.28 (*)    MCH 25.2 (*)    RDW 17.8 (*)    All other components within normal limits  COMPREHENSIVE METABOLIC PANEL - Abnormal; Notable  for the following components:   Sodium 133 (*)    Glucose, Bld 397 (*)    Calcium 8.8 (*)    Albumin 3.3 (*)    Alkaline Phosphatase 185 (*)    Anion gap 4 (*)    All other components within normal limits  CBG MONITORING, ED - Abnormal; Notable for the following components:   Glucose-Capillary 298 (*)    All other components within normal limits    EKG None  Radiology VAS [place] LOWER EXTREMITY VENOUS (DVT) (7a-7p)  Result Date: 04/21/2021  Lower Venous DVT Study Patient Name:  ESHAAL DUBY  Date of Exam:   04/21/2021 Medical Rec #: [id number]           Accession #:    [id number] Date of Birth: Jul 04, 1955           Patient Gender: F Patient Age:   17 years Exam Location:  [facility] Procedure:      VAS [place] LOWER EXTREMITY VENOUS (DVT) Referring Phys: Shawniece Oyola --------------------------------------------------------------------------------  Indications: Edema.  Risk Factors: None identified. Limitations: Body habitus, poor ultrasound/tissue interface and patient positioning, patient pain tolerance. Comparison Study: No prior studies. Performing Technologist: Oliver Hum RVT  Examination Guidelines: A complete evaluation includes B-mode imaging, spectral Doppler, color Doppler, and power Doppler as needed of all accessible portions of each vessel. Bilateral testing is considered an integral part of a complete examination. Limited examinations for reoccurring indications may be performed as noted. The reflux portion of the exam is performed with the  patient in reverse Trendelenburg.  +---------+---------------+---------+-----------+----------+-------------------+  RIGHT     Compressibility Phasicity Spontaneity Properties Thrombus Aging       +---------+---------------+---------+-----------+----------+-------------------+  CFV       Full            Yes       Yes                                         +---------+---------------+---------+-----------+----------+-------------------+  SFJ       Full                                                                  +---------+---------------+---------+-----------+----------+-------------------+  FV Prox   Full                                                                  +---------+---------------+---------+-----------+----------+-------------------+  FV Mid                    Yes       Yes                                         +---------+---------------+---------+-----------+----------+-------------------+  FV Distal  Not well visualized  +---------+---------------+---------+-----------+----------+-------------------+  PFV       Full                                                                  +---------+---------------+---------+-----------+----------+-------------------+  POP       Full            Yes       Yes                                         +---------+---------------+---------+-----------+----------+-------------------+  PTV       Full                                                                  +---------+---------------+---------+-----------+----------+-------------------+  PERO                                                       Not well visualized  +---------+---------------+---------+-----------+----------+-------------------+   +---------+---------------+---------+-----------+----------+-------------------+  LEFT      Compressibility Phasicity Spontaneity Properties Thrombus Aging        +---------+---------------+---------+-----------+----------+-------------------+  CFV       Full            Yes       Yes                                         +---------+---------------+---------+-----------+----------+-------------------+  SFJ       Full                                                                  +---------+---------------+---------+-----------+----------+-------------------+  FV Prox   Full                                                                  +---------+---------------+---------+-----------+----------+-------------------+  FV Mid    Full                                                                  +---------+---------------+---------+-----------+----------+-------------------+  FV  Distal Full                                                                  +---------+---------------+---------+-----------+----------+-------------------+  PFV       Full                                                                  +---------+---------------+---------+-----------+----------+-------------------+  POP       Full            Yes       Yes                                         +---------+---------------+---------+-----------+----------+-------------------+  PTV       Full                                                                  +---------+---------------+---------+-----------+----------+-------------------+  PERO                                                       Not well visualized  +---------+---------------+---------+-----------+----------+-------------------+    Summary: RIGHT: - There is no evidence of deep vein thrombosis in the lower extremity. However, portions of this examination were limited- see technologist comments above.  - No cystic structure found in the popliteal fossa.  LEFT: - There is no evidence of deep vein thrombosis in the lower extremity. However, portions of this examination were limited- see technologist comments above.  - No cystic  structure found in the popliteal fossa.  *See table(s) above for measurements and observations.    Preliminary     Procedures Procedures    Medications Ordered in ED Medications  insulin aspart (novoLOG) injection 12 Units (12 Units Subcutaneous Given 04/21/21 1613)    ED Course/ Medical Decision Making/ A&P                           Medical Decision Making Amount and/or Complexity of Data Reviewed Labs: ordered. ECG/medicine tests: ordered.  Risk Prescription drug management.   Review of records shows prior outpatient visit with primary care doctor today.  Labs are sent today unremarkable white count normal chemistry normal.  Ultrasound performed with no evidence of DVT bilaterally.  We will advised the patient to follow-up with her primary care doctor in outpatient basis.  Advising return for difficulty breathing or worsening symptoms.  Glucose of noted to be elevated and given insulin with improvement of glucose levels.        Final Clinical  Impression(s) / ED Diagnoses Final diagnoses:  Leg swelling  Hyperglycemia    Rx / DC Orders ED Discharge Orders     None         Luna Fuse, MD 04/21/21 [phone]

## 2021-04-21 NOTE — ED Notes (Signed)
An After Visit Summary was printed and given to the patient. ?Discharge instructions given and no further questions at this time.  ?Pt waiting for PTAR as requested.  ?

## 2021-04-30 DIAGNOSIS — Z1152 Encounter for screening for COVID-19: Secondary | ICD-10-CM | POA: Diagnosis not present

## 2021-05-12 ENCOUNTER — Ambulatory Visit (HOSPITAL_BASED_OUTPATIENT_CLINIC_OR_DEPARTMENT_OTHER): Payer: Medicare Other | Attending: Internal Medicine | Admitting: Internal Medicine

## 2021-05-12 VITALS — Ht 62.0 in | Wt 290.0 lb

## 2021-05-12 DIAGNOSIS — G4733 Obstructive sleep apnea (adult) (pediatric): Secondary | ICD-10-CM | POA: Diagnosis not present

## 2021-05-16 DIAGNOSIS — G4733 Obstructive sleep apnea (adult) (pediatric): Secondary | ICD-10-CM | POA: Diagnosis not present

## 2021-05-16 NOTE — Procedures (Signed)
? ? ? ?  Patient Name: [name], [name] ?Study Date: 05/12/2021 ?Gender: Female ?D.O.B: May 05, 1955 ?Age (years): 51 ?Referring Provider: Gayland Curry DO ?Height (inches): 62 ?Interpreting Physician: Baird Lyons MD, ABSM ?Weight (lbs): 290 ?RPSGT: Carolin Coy ?BMI: 53 ?MRN: [id number] ?Neck Size: 17.50 ? ?CLINICAL INFORMATION ?Sleep Study Type: NPSG ?Indication for sleep study: Daytime Fatigue, Depression, Diabetes, Fatigue, Hypertension, Morbid Obesity, Obesity, Snoring ?Epworth Sleepiness Score: 7 ? ?SLEEP STUDY TECHNIQUE ?As per the AASM Manual for the Scoring of Sleep and Associated Events v2.3 (April 2016) with a hypopnea requiring 4% desaturations. ? ?The channels recorded and monitored were frontal, central and occipital EEG, electrooculogram (EOG), submentalis EMG (chin), nasal and oral airflow, thoracic and abdominal wall motion, anterior tibialis EMG, snore microphone, electrocardiogram, and pulse oximetry. ? ?MEDICATIONS ?Medications self-administered by patient taken the night of the study : none reported ? ?SLEEP ARCHITECTURE ?The study was initiated at 10:59:24 PM and ended at 4:57:28 AM. ? ?Sleep onset time was 10.9 minutes and the sleep efficiency was 80.3%%. The total sleep time was 287.5 minutes. ? ?Stage REM latency was 147.0 minutes. ? ?The patient spent 7.8%% of the night in stage N1 sleep, 67.3%% in stage N2 sleep, 0.0%% in stage N3 and 24.9% in REM. ? ?Alpha intrusion was absent. ? ?Supine sleep was 100.00%. ? ?RESPIRATORY PARAMETERS ?The overall apnea/hypopnea index (AHI) was 26.3 per hour. There were 13 total apneas, including 13 obstructive, 0 central and 0 mixed apneas. There were 113 hypopneas and 43 RERAs. ? ?The AHI during Stage REM sleep was 61.3 per hour. ? ?AHI while supine was 26.3 per hour. ? ?The mean oxygen saturation was 94.2%. The minimum SpO2 during sleep was 79.0%. ? ?moderate snoring was noted during this study. ? ?CARDIAC DATA ?The 2 lead EKG demonstrated sinus rhythm.  The mean heart rate was 81.4 beats per minute. Other EKG findings include: None. ? ?LEG MOVEMENT DATA ?The total PLMS were 0 with a resulting PLMS index of 0.0. Associated arousal with leg movement index was 0.0 . ? ?IMPRESSIONS ?- Moderate obstructive sleep apnea occurred during this study (AHI = 26.3/h). ?- Moderate oxygen desaturation was noted during this study (Min O2 = 79.0%). Supplemental O2 added per lab protocol at 11:44 PM, 1LPM, for persistent O2 saturation ?- The patient snored with moderate snoring volume. ?- No cardiac abnormalities were noted during this study. ?- Clinically significant periodic limb movements did not occur during sleep. No significant associated arousals. ?- Patient is not ambulatory. She needed an aide to stay with her to assist her. ? ?DIAGNOSIS ?- Obstructive Sleep Apnea (G47.33) ?- Nocturnal Hypoxemia (G47.36) ? ?RECOMMENDATIONS ?- Suggest CPAP titration sleep study or autopap. Other options would be based on clinical judgment. ?- Be careful with alcohol, sedatives and other CNS depressants that may worsen sleep apnea and disrupt normal sleep architecture. ?- Sleep hygiene should be reviewed to assess factors that may improve sleep quality. ?- Weight management and regular exercise should be initiated or continued if appropriate. ? ?[Electronically signed] 05/16/2021 11:35 AM ? ?Baird Lyons MD, ABSM ?Diplomate, [place] Board of Sleep Medicine ? ? ?NPI: [id number] ?  ? ? ? ? ? ? ? ? ? ? ? ? ? ? ? ? ? ? ? ? ? ?[name] [name] ?Diplomate, [employer] of Sleep Medicine ? ?ELECTRONICALLY SIGNED ON:  05/16/2021, 11:28 AM ?[place] ?PH: [phone]   FX: [phone] ?ACCREDITED BY THE AMERICAN ACADEMY OF SLEEP MEDICINE ?

## 2021-05-25 ENCOUNTER — Encounter: Payer: Self-pay | Admitting: Internal Medicine

## 2021-05-25 ENCOUNTER — Other Ambulatory Visit: Payer: Medicare Other | Admitting: Internal Medicine

## 2021-05-25 VITALS — BP 138/70 | HR 76

## 2021-05-25 DIAGNOSIS — H0011 Chalazion right upper eyelid: Secondary | ICD-10-CM | POA: Diagnosis not present

## 2021-05-25 DIAGNOSIS — L700 Acne vulgaris: Secondary | ICD-10-CM | POA: Diagnosis not present

## 2021-05-25 DIAGNOSIS — Z515 Encounter for palliative care: Secondary | ICD-10-CM

## 2021-05-25 DIAGNOSIS — G4733 Obstructive sleep apnea (adult) (pediatric): Secondary | ICD-10-CM

## 2021-05-25 DIAGNOSIS — H6002 Abscess of left external ear: Secondary | ICD-10-CM | POA: Diagnosis not present

## 2021-05-25 MED ORDER — INSULIN DETEMIR 100 UNIT/ML FLEXPEN: 48.0000 [IU] | PEN_INJECTOR | Freq: Every day | SUBCUTANEOUS | 0 refills | Status: DC

## 2021-05-25 NOTE — Progress Notes (Signed)
[facility] Palliative Care Follow-Up Visit Telephone: [phone]  Fax: [phone]    Date of encounter: 05/25/21 10:36 AM PATIENT NAME: [name] [name] [address] [place] [address]   [phone] (home)  DOB: 10/25/1955 MRN: [id number] PRIMARY CARE PROVIDER:    System, Provider Not In,  No address on file None   REFERRING PROVIDER:   No referring provider defined for this encounter. N/A   RESPONSIBLE PARTY:    Contact Information       Name Relation Home Work [place] Son [phone]   [phone]    [place] Areas [phone]   [phone]    Shaw,Shirley Sister [phone]   [phone]             I met face to face with patient and family in home. Palliative Care was asked to follow this patient by consultation request of  No ref. provider found to address advance care planning and complex medical decision making. This is follow-up visit.                                       ASSESSMENT AND PLAN / RECOMMENDATIONS:    Advance Care Planning/Goals of Care: Goals include to maximize quality of life and symptom management. Patient/health care surrogate gave his/her permission to discuss.Our advance care planning conversation included a discussion about:    The value and importance of advance care planning  Experiences with loved ones who have been seriously ill or have died.  Exploration of personal, cultural or spiritual beliefs that might influence medical decisions.  Exploration of goals of care in the event of a sudden injury or illness  Identification of a healthcare agent  Review and updating or creation of an advance directive document. Decision not to resuscitate or to de-escalate disease focused treatments due to poor prognosis. CODE STATUS: Full Code: If she loses consciousness and something like that happens, she will want to have every opportunity to come around.  Talks about  someone who recovered from being in a coma.  Says God has the last say so.  18 mins spent on ACP discussing her brother's situation and her own.     Symptom Management/Plan:  OSA/Nocturnal Hypoxemia: Sleep study on 05/12/21 found moderate OSA with moderate 02 desaturation to 79%, and moderate snoring. No cardiac abnormalities or significant limb movements noted. Plan: CPAP titration sleep study. Encouraged sleep hygiene: limit caffeine intake, bedtime routine, avoid cell phone use before bed, etc. Weight management and regular exercise as tolerated.  Left ear abscess: Plan: Warm compresses.   Right upper eyelid chalazion: Plan: Warm compresses.   Recurrent skin lesions: Multiple skin eruptions on face and bilateral upper arms. Plan: Keep skin clean and dry, avoid excessive moisture. Continue Nystatin cream PRN. Dermatology referral.    Follow up Palliative Care Visit: Palliative care will continue to follow for complex medical decision making, advance care planning, and clarification of goals. Return 8 weeks or prn.   This visit was coded based on medical decision making (MDM).   PPS: 30%   HOSPICE ELIGIBILITY/DIAGNOSIS: not currently   Chief Complaint: Follow-up palliative visit   HISTORY OF PRESENT ILLNESS:  [name] [name] is a 65 y.o. 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 Sleep  study on 05/12/21 found moderate OSA with moderate 02 desaturation to 79%, and moderate snoring. No cardiac abnormalities or significant limb movements noted.  Today patient is alert, oriented, and cooperative. Not sleeping well at night due to anxiety and pain. She is no longer on [place].  Dr. Vira Blanco retired and new doctor gave her Norco 5/332m until she gets in over to [facility] on [address].  She's waiting to hear for her appt there.  7/10 pain after not  going to sleep throughout night. Also, weighing on her mind is her brother, who is on life support after colon cancer surgery and PE.  He's also had strokes and mini strokes.  He was taken off ventilator, he arrested, and they put him back on and hospice has been recommended.  The other siblings are resistant to that. She accepts that her brother would not want to live like a vegetable. She is still working on weight loss and increasing activity. Takes Ozempic 0.560mon each side each Thursday. She has not yet taken her blood sugar today. CBG was 343 yesterday, typically checks once daily. Takes 48 units of Levemir daily. She's not sure of her last hba1c.  Just had labs last Friday at nephrology.  Has not heard results yet. One small, excoriated area under right breast, healing well with Nystatin cream. Multiple, intact skin lesions on face and upper arms. Denies having these in axilla or groin areas. New, non-draining abscess in left ear. Remains bedbound and total care for all ADL's. On airbed to protect skin integrity. Getting up with hoyer lift into power chair with PT weekly. She'd like to have it twice a week, so she'd get up in the chair and exercise more often. Has two PCG's in home who assist with bathing, dressing, and personal care.    History obtained from review of EMR, discussion with primary team, and interview with family, facility staff/caregiver and/or [name] [name].    I reviewed available labs, medications, imaging, studies and related documents from the EMR.  Records reviewed and summarized above.    ROS   General: NAD EYES: denies vision changes ENMT: denies dysphagia Cardiovascular: denies chest pain, denies DOE Pulmonary: denies cough, denies increased SOB Abdomen: endorses good appetite, denies constipation, endorses continence of bowel GU: denies dysuria, endorses continence of urine MSK:  denies increased weakness, no falls reported Skin: new skin eruption in left  ear. Neurological: pain 7/10 that affects sleep. Psych: Endorses positive mood, increased stress and anxiety regarding her brother who is currently on life support. Heme/lymph/immuno: denies bruises, abnormal bleeding.   Physical Exam: Current and past weights: Ht. 5'2", Wt. 290lbs, BMI 53.04  Constitutional: NAD General: morbidly obese  EYES: anicteric sclera, lids intact, no discharge  ENMT: intact hearing, oral mucous membranes moist, dentition intact CV: S1S2, RRR, no LE edema Pulmonary: LCTA, no increased work of breathing, no cough, room air Abdomen: intake 100%, normo-active BS + 4 quadrants, soft and non-tender, no ascites GU: deferred MSK: no sarcopenia, functional use of bilateral upper extremities, limited movement of bilateral lower extremities. Obvious left thigh deformity r/t inoperable femur fracture. Non-ambulatory uses hoyer lift for transfers. Skin: warm and dry, visible papules/comedones on face and BUE. Small, excoriated area under left breast, non-draining abscess in left ear. Neuro: generalized weakness, no cognitive impairment Psych: non-anxious affect, cooperative, A and O x 3 Hem/lymph/immuno: no widespread bruising  CURRENT PROBLEM LIST:      Patient Active Problem List    Diagnosis Date  Noted   DKA (diabetic ketoacidosis) ([place]) 02/02/2021   Class 3 obesity ([place]) 02/02/2021   Hyperglycemia 02/01/2021   Pancreatitis 09/07/2020   Intractable nausea and vomiting 09/07/2020   Functional quadriplegia ([place]) 09/07/2020   Physical debility 08/26/2020   OSA on CPAP 08/26/2020   Constipation     Acute renal failure superimposed on stage 3a chronic kidney disease, unspecified acute renal failure type ([place]) 08/25/2020   Acute renal failure superimposed on stage 3a chronic kidney disease ([place]) 07/30/2020   Lower extremity cellulitis 02/04/2020   Chronic diastolic CHF (congestive heart failure) ([place]) 11/23/2019   Vaginal discharge 11/06/2019   Morbid obesity ([place])  08/22/2019   Opiate overdose ([place]) 08/20/2019   Cellulitis 10/05/2018   Acute cystitis without hematuria 10/05/2018   Essential hypertension 10/05/2018   Pressure injury of skin 10/05/2018   Hypoglycemia secondary to sulfonylurea 11/21/2016   Substance induced mood disorder ([place]) 07/02/2014   Encephalopathy     Hypokalemia     Type 2 diabetes mellitus with stage 3 chronic kidney disease (HCC)     HLD (hyperlipidemia)     Depression     Anxiety state      PAST MEDICAL HISTORY:      Active Ambulatory Problems    Diagnosis Date Noted   Encephalopathy     Hypokalemia     Type 2 diabetes mellitus with stage 3 chronic kidney disease ([place])     HLD (hyperlipidemia)     Depression     Anxiety state     Substance induced mood disorder ([place]) 07/02/2014   Hypoglycemia secondary to sulfonylurea 11/21/2016   Cellulitis 10/05/2018   Acute cystitis without hematuria 10/05/2018   Essential hypertension 10/05/2018   Pressure injury of skin 10/05/2018   Opiate overdose ([place]) 08/20/2019   Morbid obesity ([place]) 08/22/2019   Vaginal discharge 11/06/2019   Chronic diastolic CHF (congestive heart failure) ([place]) 11/23/2019   Lower extremity cellulitis 02/04/2020   Acute renal failure superimposed on stage 3a chronic kidney disease ([place]) 07/30/2020   Acute renal failure superimposed on stage 3a chronic kidney disease, unspecified acute renal failure type ([place]) 08/25/2020   Physical debility 08/26/2020   OSA on CPAP 08/26/2020   Constipation     Pancreatitis 09/07/2020   Intractable nausea and vomiting 09/07/2020   Functional quadriplegia ([place]) 09/07/2020   Hyperglycemia 02/01/2021   DKA (diabetic ketoacidosis) ([place]) 02/02/2021   Class 3 obesity ([place]) 02/02/2021        Resolved Ambulatory Problems    Diagnosis Date Noted   Hyperkalemia     Metabolic encephalopathy     GI bleeding 10/19/2019   Anemia associated with acute blood loss 11/11/2019   Hemorrhagic shock ([place]) 11/27/2019         Past Medical History:  Diagnosis Date   Diabetes mellitus without complication ([place])     Hypertension     Knee pain, chronic     Neuropathy in diabetes ([place])      SOCIAL HX:  Social History         Tobacco Use   Smoking status: Former   Smokeless tobacco: Never  Substance Use Topics   Alcohol use: Yes      Alcohol/week: 0.0 standard drinks      Comment: socially        ALLERGIES:       Allergies  Allergen Reactions   Lisinopril Swelling   Morphine Itching and Rash   Penicillins Hives, Itching, Nausea And Vomiting and Rash  Has patient had a PCN reaction causing immediate rash, facial/tongue/throat swelling, SOB or lightheadedness with hypotension: No Has patient had a PCN reaction causing severe rash involving mucus membranes or skin necrosis: No Has patient had a PCN reaction that required hospitalization: No Has patient had a PCN reaction occurring within the last 10 years: No If all of the above answers are "NO", then may proceed with Cephalosporin use.       PERTINENT MEDICATIONS:      Outpatient Encounter Medications as of 05/25/2021  Medication Sig   acetaminophen (TYLENOL) 500 MG tablet Take 1 tablet by mouth every 6 (six) hours.   albuterol (VENTOLIN HFA) 108 (90 Base) MCG/ACT inhaler Inhale 2 puffs into the lungs every 6 (six) hours as needed for wheezing or shortness of breath.   atorvastatin (LIPITOR) 10 MG tablet Take 10 mg by mouth daily.   carbamide peroxide (DEBROX) 6.5 % OTIC solution INSTILL 5-10 DROPS IN LEFT EAR AT BEDTIME FOR WAX REMOVAL   cetirizine (ZYRTEC) 10 MG tablet Take 10 mg by mouth daily.   diclofenac Sodium (VOLTAREN) 1 % GEL APPLY 4 GRAMS TO AFFECTED AREA FOUR TIMES A DAY AS NEEDED FOR KNEE PAIN   Ensure (ENSURE) Take 237-474 mLs by mouth daily.   escitalopram (LEXAPRO) 10 MG tablet Take 5 mg by mouth daily.   furosemide (LASIX) 40 MG tablet TAKE ONE TABLET BY MOUTH MONDAY, WEDNESDAY AND FRIDAY (FLUID PILL) FOR EDEMA    glucose-Vitamin C 4-0.006 GM CHEW chewable tablet CHEW FOUR TABLETS BY MOUTH  AS NEEDED (REPEAT EVERY 15 MINUTES IF BLOOD SUGAR LESS THAN 70) FOR BLOOD SUGAR LESS THAN 70   hydrocortisone 2.5 % lotion Apply 1 application topically 2 (two) times daily. Face itching   hydrOXYzine (VISTARIL) 25 MG capsule Take 25 mg by mouth every 6 (six) hours as needed for itching.   insulin detemir (LEVEMIR) 100 UNIT/ML FlexPen Inject 48 Units into the skin daily.   Insulin Pen Needle 31G X 5 MM MISC Use as directed daily   liver oil-zinc oxide (DESITIN) 40 % ointment Apply topically 4 (four) times daily.   midodrine (PROAMATINE) 5 MG tablet Take 3 tablets (15 mg total) by mouth 3 (three) times daily with meals.   mirtazapine (REMERON) 15 MG tablet Take 15 mg by mouth at bedtime.   mirtazapine (REMERON) 15 MG tablet Take 1 tablet by mouth at bedtime.   nystatin (MYCOSTATIN/NYSTOP) powder Apply to affected area 2 (two) times daily to groin and areas of fungal infection   nystatin cream (MYCOSTATIN) APPLY AS DIRECTED TO AFFECTED AREA TWICE A DAY FOR FUNGAL RASH   omeprazole (PRILOSEC OTC) 20 MG tablet Take 2 tablets (40 mg total) by mouth daily.   ondansetron (ZOFRAN) 4 MG tablet Take 1 tablet (4 mg total) by mouth every 6 (six) hours as needed for nausea.   pantoprazole (PROTONIX) 40 MG tablet Take 40 mg by mouth daily.   potassium chloride SA (KLOR-CON M) 20 MEQ tablet TAKE ONE TABLET BY MOUTH MONDAY, WEDNESDAY AND FRIDAY FOR POTASSIUM REPLACEMEMT (TAKE WITH FOOD)   pregabalin (LYRICA) 50 MG capsule Take 1 capsule (50 mg total) by mouth 3 (three) times daily.   Semaglutide,0.25 or 0.5MG/DOS, 2 MG/1.5ML SOPN INJECT 0.5MG SUBCUTANEOUSLY ONCE WEEKLY FOR DIABETES       traMADol (ULTRAM) 50 MG tablet Take 50 mg by mouth daily as needed for moderate pain.    No facility-administered encounter medications on file as of 05/25/2021.    Thank you for  the opportunity to participate in the care of Ms. Phariss.  The palliative  care team will continue to follow. Please call our office at [phone] if we can be of additional assistance.    Hollace Kinnier, DO   COVID-19 PATIENT SCREENING TOOL Asked and negative response unless otherwise noted:   Have you had symptoms of covid, tested positive or been in contact with someone with symptoms/positive test in the past 5-10 days? no

## 2021-06-13 ENCOUNTER — Emergency Department (HOSPITAL_COMMUNITY): Payer: No Typology Code available for payment source

## 2021-06-13 ENCOUNTER — Other Ambulatory Visit: Payer: Self-pay

## 2021-06-13 ENCOUNTER — Emergency Department (HOSPITAL_COMMUNITY)
Admission: EM | Admit: 2021-06-13 | Discharge: 2021-06-13 | Disposition: A | Discharge status: Home / Self Care | Payer: No Typology Code available for payment source | Attending: Emergency Medicine | Admitting: Emergency Medicine

## 2021-06-13 ENCOUNTER — Encounter (HOSPITAL_COMMUNITY): Payer: Self-pay

## 2021-06-13 DIAGNOSIS — Z743 Need for continuous supervision: Secondary | ICD-10-CM | POA: Diagnosis not present

## 2021-06-13 DIAGNOSIS — N898 Other specified noninflammatory disorders of vagina: Secondary | ICD-10-CM | POA: Diagnosis not present

## 2021-06-13 DIAGNOSIS — R739 Hyperglycemia, unspecified: Secondary | ICD-10-CM | POA: Insufficient documentation

## 2021-06-13 DIAGNOSIS — Z794 Long term (current) use of insulin: Secondary | ICD-10-CM | POA: Diagnosis not present

## 2021-06-13 DIAGNOSIS — R279 Unspecified lack of coordination: Secondary | ICD-10-CM | POA: Diagnosis not present

## 2021-06-13 DIAGNOSIS — R1032 Left lower quadrant pain: Secondary | ICD-10-CM | POA: Diagnosis present

## 2021-06-13 DIAGNOSIS — R600 Localized edema: Secondary | ICD-10-CM | POA: Insufficient documentation

## 2021-06-13 LAB — COMPREHENSIVE METABOLIC PANEL
ALT: 16 U/L (ref 0–44)
AST: 18 U/L (ref 15–41)
Albumin: 3.4 g/dL — ABNORMAL LOW (ref 3.5–5.0)
Alkaline Phosphatase: 222 U/L — ABNORMAL HIGH (ref 38–126)
Anion gap: 8 (ref 5–15)
BUN: 36 mg/dL — ABNORMAL HIGH (ref 8–23)
CO2: 24 mmol/L (ref 22–32)
Calcium: 9.3 mg/dL (ref 8.9–10.3)
Chloride: 103 mmol/L (ref 98–111)
Creatinine, Ser: 1.35 mg/dL — ABNORMAL HIGH (ref 0.44–1.00)
GFR, Estimated: 43 mL/min — ABNORMAL LOW (ref >60)
Glucose, Bld: 548 mg/dL (ref 70–99)
Potassium: 4.9 mmol/L (ref 3.5–5.1)
Sodium: 135 mmol/L (ref 135–145)
Total Bilirubin: 0.8 mg/dL (ref 0.3–1.2)
Total Protein: 7.4 g/dL (ref 6.5–8.1)

## 2021-06-13 LAB — CBC WITH DIFFERENTIAL/PLATELET
Abs Immature Granulocytes: 0.02 10*3/uL (ref 0.00–0.07)
Basophils Absolute: 0 10*3/uL (ref 0.0–0.1)
Basophils Relative: 1 %
Eosinophils Absolute: 0.2 10*3/uL (ref 0.0–0.5)
Eosinophils Relative: 2 %
HCT: 47.3 % — ABNORMAL HIGH (ref 36.0–46.0)
Hemoglobin: 14.3 g/dL (ref 12.0–15.0)
Immature Granulocytes: 0 %
Lymphocytes Relative: 27 %
Lymphs Abs: 2 10*3/uL (ref 0.7–4.0)
MCH: 25.6 pg — ABNORMAL LOW (ref 26.0–34.0)
MCHC: 30.2 g/dL (ref 30.0–36.0)
MCV: 84.6 fL (ref 80.0–100.0)
Monocytes Absolute: 0.7 10*3/uL (ref 0.1–1.0)
Monocytes Relative: 10 %
Neutro Abs: 4.6 10*3/uL (ref 1.7–7.7)
Neutrophils Relative %: 60 %
Platelets: 321 10*3/uL (ref 150–400)
RBC: 5.59 MIL/uL — ABNORMAL HIGH (ref 3.87–5.11)
RDW: 15.1 % (ref 11.5–15.5)
WBC: 7.6 10*3/uL (ref 4.0–10.5)
nRBC: 0 % (ref 0.0–0.2)

## 2021-06-13 LAB — URINALYSIS, ROUTINE W REFLEX MICROSCOPIC
Bilirubin Urine: NEGATIVE
Glucose, UA: >=500 mg/dL — AB
Hgb urine dipstick: NEGATIVE
Ketones, ur: NEGATIVE mg/dL
Leukocytes,Ua: NEGATIVE
Nitrite: NEGATIVE
Protein, ur: 30 mg/dL — AB
Specific Gravity, Urine: 1.01 (ref 1.005–1.030)
pH: 6 (ref 5.0–8.0)

## 2021-06-13 LAB — WET PREP, GENITAL
Clue Cells Wet Prep HPF POC: NONE SEEN
Sperm: NONE SEEN
Trich, Wet Prep: NONE SEEN
WBC, Wet Prep HPF POC: >=10 — AB (ref <10)
Yeast Wet Prep HPF POC: NONE SEEN

## 2021-06-13 LAB — CBG MONITORING, ED
Glucose-Capillary: 504 mg/dL (ref 70–99)
Glucose-Capillary: 400 mg/dL — ABNORMAL HIGH (ref 70–99)
Glucose-Capillary: 322 mg/dL — ABNORMAL HIGH (ref 70–99)

## 2021-06-13 MED ORDER — IBUPROFEN 800 MG PO TABS: 800.0000 mg | ORAL_TABLET | Freq: Three times a day (TID) | ORAL | 0 refills | Status: DC | PRN

## 2021-06-13 MED ORDER — SODIUM CHLORIDE (PF) 0.9 % IJ SOLN
INTRAMUSCULAR | Status: AC
  Filled 2021-06-13: qty 50

## 2021-06-13 MED ORDER — FLUCONAZOLE 150 MG PO TABS: 150.0000 mg | ORAL_TABLET | Freq: Every day | ORAL | 0 refills | Status: AC

## 2021-06-13 MED ORDER — SODIUM CHLORIDE 0.9 % IV BOLUS
500.0000 mL | Freq: Once | INTRAVENOUS | Status: AC
  Administered 2021-06-13: 500 mL via INTRAVENOUS

## 2021-06-13 MED ORDER — SODIUM CHLORIDE (PF) 0.9 % IJ SOLN
INTRAMUSCULAR | Status: AC
Start: 2021-06-13 — End: 2021-06-13
  Filled 2021-06-13: qty 50

## 2021-06-13 MED ORDER — IBUPROFEN 800 MG PO TABS
800.0000 mg | ORAL_TABLET | Freq: Once | ORAL | Status: AC
  Administered 2021-06-13: 800 mg via ORAL
  Filled 2021-06-13: qty 1

## 2021-06-13 MED ORDER — INSULIN ASPART 100 UNIT/ML IJ SOLN
10.0000 [IU] | Freq: Once | INTRAMUSCULAR | Status: AC
  Administered 2021-06-13: 10 [IU] via INTRAVENOUS
  Filled 2021-06-13: qty 0.1

## 2021-06-13 MED ORDER — IOHEXOL 300 MG/ML  SOLN
100.0000 mL | Freq: Once | INTRAMUSCULAR | Status: AC | PRN
  Administered 2021-06-13: 100 mL via INTRAVENOUS

## 2021-06-13 NOTE — Discharge Instructions (Addendum)
There is some vaginal discharge.  I do not see clear stool in the vagina at this point.  However it is probably worth following up with gynecology for a better look.  There is some yeast on the skin likely.  No yeast in the vagina.  Continue the nystatin and we have given you a prescription for dose of stronger antifungal. ?Your sugars have improved, however you do need better control at home.  Be sure to take your medicines ?

## 2021-06-13 NOTE — ED Provider Notes (Signed)
?[facility] DEPT ?Provider Note ? ? ?CSN: [id number] ?Arrival date & time: 06/13/21  1416 ? ?  ? ?History ? ?Chief Complaint  ?Patient presents with  ? Vaginal Discharge  ? Hyperglycemia  ?   ?  ? ? ?[name] [name] is a 65 y.o. female. ? ? ?Vaginal Discharge ?Hyperglycemia ?Patient is morbidly obese.  Functionally paraplegic.  Bedbound.  Reportedly for the last 2 days has had stool coming out of her vagina.  Has had previous hysterectomy.  States she does have some lower abdominal pain.  No fevers.  Reported looser stool coming out of vagina but hard stool coming out of her rectum. ?Also sugars elevated.  Have been above 500.  Patient states she has not been taking all the medicines that she needs to. ?  ? ?Home Medications ?Prior to Admission medications   ?Medication Sig Start Date End Date Taking? Authorizing Provider  ?acetaminophen (TYLENOL) 500 MG tablet Take 500 mg by mouth every 6 (six) hours as needed. 07/24/20  Yes [provider]  ?albuterol (VENTOLIN HFA) 108 (90 Base) MCG/ACT inhaler Inhale 2 puffs into the lungs every 6 (six) hours as needed for wheezing or shortness of breath. 08/06/20  Yes Aline August, MD  ?atorvastatin (LIPITOR) 10 MG tablet Take 10 mg by mouth at bedtime.   Yes [provider]  ?Carboxymethylcellulose Sodium 0.25 % SOLN Place 1 drop into both eyes 4 (four) times daily as needed (dry eyes).   Yes [provider]  ?cetirizine (ZYRTEC) 10 MG tablet Take 10 mg by mouth daily. 11/20/20  Yes [provider]  ?Ensure (ENSURE) Take 237 mLs by mouth 2 (two) times daily between meals.   Yes [provider]  ?fluconazole (DIFLUCAN) 150 MG tablet Take 1 tablet (150 mg total) by mouth daily for 1 dose. 06/13/21 06/14/21 Yes Davonna Belling, MD  ?furosemide (LASIX) 40 MG tablet Take 40 mg by mouth every Monday, Wednesday, and Friday. 02/09/21  Yes [provider]  ?glucose 4 GM chewable tablet Chew 4 tablets by  mouth as needed for low blood sugar.   Yes [provider]  ?HYDROcodone-acetaminophen (NORCO/VICODIN) 5-325 MG tablet Take 1 tablet by mouth 3 (three) times daily as needed (pain). 05/10/21  Yes [provider]  ?hydrOXYzine (VISTARIL) 25 MG capsule Take 25 mg by mouth 3 (three) times daily as needed for itching or anxiety. 10/30/20  Yes [provider]  ?ibuprofen (ADVIL) 800 MG tablet Take 1 tablet (800 mg total) by mouth every 8 (eight) hours as needed. 06/13/21  Yes Davonna Belling, MD  ?insulin detemir (LEVEMIR) 100 UNIT/ML FlexPen Inject 48 Units into the skin daily. ?Patient taking differently: Inject 48 Units into the skin daily before breakfast. 05/25/21  Yes Reed, Tiffany L, DO  ?mirtazapine (REMERON) 15 MG tablet Take 15 mg by mouth at bedtime as needed (sleep). 11/06/20  Yes [provider]  ?nystatin (MYCOSTATIN/NYSTOP) powder Apply to affected area 2 (two) times daily to groin and areas of fungal infection 02/03/21  Yes Dessa Phi, DO  ?nystatin cream (MYCOSTATIN) 1 application. 2 (two) times daily. 01/13/21  Yes [provider]  ?pantoprazole (PROTONIX) 40 MG tablet Take 40 mg by mouth daily before breakfast.   Yes [provider]  ?potassium chloride SA (KLOR-CON M) 20 MEQ tablet 20 mEq every Monday, Wednesday, and Friday. 02/12/21  Yes [provider]  ?pregabalin (LYRICA) 75 MG capsule Take 75 mg by mouth 2 (two) times daily.   Yes  [provider]  ?Semaglutide,0.25 or 0.'5MG'$ /DOS, 2 MG/1.5ML SOPN Inject 2 mg into the skin every Thursday. 02/24/21  Yes [provider]  ?escitalopram (LEXAPRO) 10 MG tablet Take 10 mg by mouth every morning. ?Patient not taking: Reported on 06/13/2021    [provider]  ?hydrocortisone 2.5 % lotion Apply 1 application topically 2 (two) times daily. Face itching ?Patient not taking: Reported on 06/13/2021 08/28/19   [provider]  ?Insulin Pen Needle 31G X 5 MM MISC Use as  directed daily 02/03/21   Dessa Phi, DO  ?liver oil-zinc oxide (DESITIN) 40 % ointment Apply topically 4 (four) times daily. ?Patient not taking: Reported on 06/13/2021 02/03/21   Dessa Phi, DO  ?midodrine (PROAMATINE) 5 MG tablet Take 3 tablets (15 mg total) by mouth 3 (three) times daily with meals. ?Patient not taking: Reported on 06/13/2021 08/06/20   Aline August, MD  ?ondansetron (ZOFRAN) 4 MG tablet Take 1 tablet (4 mg total) by mouth every 6 (six) hours as needed for nausea. ?Patient not taking: Reported on 06/13/2021 08/28/20   Little Ishikawa, MD  ?   ? ?Allergies    ?Lisinopril, Morphine, and Penicillins   ? ?Review of Systems   ?Review of Systems  ?Genitourinary:  Positive for vaginal discharge.  ? ?Physical Exam ?Updated Vital Signs ?BP (!) 142/77   Pulse 87   Temp 97.7 ?F (36.5 ?C) (Oral)   Resp 19   Ht '5\' 2"'$  (1.575 m)   Wt 133.4 kg   SpO2 92%   BMI 53.77 kg/m?  ?Physical Exam ?Vitals and nursing note reviewed.  ?HENT:  ?   Head: Atraumatic.  ?Eyes:  ?   Pupils: Pupils are equal, round, and reactive to light.  ?Cardiovascular:  ?   Rate and Rhythm: Regular rhythm.  ?Pulmonary:  ?   Breath sounds: No wheezing.  ?Abdominal:  ?   Tenderness: There is abdominal tenderness.  ?   Comments: Mild suprapubic tenderness without rebound or guarding.  No hernia palpated.  ?Genitourinary: ?   Comments: Some white to brownish vaginal discharge.  No clear definite stool seen. ?Musculoskeletal:  ?   Cervical back: Neck supple.  ?   Right lower leg: Edema present.  ?   Left lower leg: Edema present.  ?Skin: ?   General: Skin is warm.  ?   Capillary Refill: Capillary refill takes less than 2 seconds.  ?Neurological:  ?   Mental Status: She is alert.  ? ? ?ED Results / Procedures / Treatments   ?Labs ?(all labs ordered are listed, but only abnormal results are displayed) ?Labs Reviewed  ?WET PREP, GENITAL - Abnormal; Notable for the following components:  ?    Result Value  ? WBC, Wet Prep HPF POC >=10  (*)   ? All other components within normal limits  ?CBC WITH DIFFERENTIAL/PLATELET - Abnormal; Notable for the following components:  ? RBC 5.59 (*)   ? HCT 47.3 (*)   ? MCH 25.6 (*)   ? All other components within normal limits  ?COMPREHENSIVE METABOLIC PANEL - Abnormal; Notable for the following components:  ? Glucose, Bld 548 (*)   ? BUN 36 (*)   ? Creatinine, Ser 1.35 (*)   ? Albumin 3.4 (*)   ? Alkaline Phosphatase 222 (*)   ? GFR, Estimated 43 (*)   ? All other components within normal limits  ?URINALYSIS, ROUTINE W REFLEX MICROSCOPIC - Abnormal; Notable for the following components:  ? Glucose, UA >=500 (*)   ?  Protein, ur 30 (*)   ? Bacteria, UA RARE (*)   ? All other components within normal limits  ?CBG MONITORING, ED - Abnormal; Notable for the following components:  ? Glucose-Capillary 504 (*)   ? All other components within normal limits  ?CBG MONITORING, ED - Abnormal; Notable for the following components:  ? Glucose-Capillary 400 (*)   ? All other components within normal limits  ?CBG MONITORING, ED - Abnormal; Notable for the following components:  ? Glucose-Capillary 322 (*)   ? All other components within normal limits  ? ? ?EKG ?None ? ?Radiology ?CT ABDOMEN PELVIS W CONTRAST ? ?Result Date: 06/13/2021 ?CLINICAL DATA:  Left lower quadrant abdominal pain. EXAM: CT ABDOMEN AND PELVIS WITH CONTRAST TECHNIQUE: Multidetector CT imaging of the abdomen and pelvis was performed using the standard protocol following bolus administration of intravenous contrast. RADIATION DOSE REDUCTION: This exam was performed according to the departmental dose-optimization program which includes automated exposure control, adjustment of the mA and/or kV according to patient size and/or use of iterative reconstruction technique. CONTRAST:  158m OMNIPAQUE IOHEXOL 300 MG/ML  SOLN COMPARISON:  September 07, 2020 FINDINGS: Lower chest: No acute abnormality. Hepatobiliary: No focal liver abnormality is seen. No gallstones,  gallbladder wall thickening, or biliary dilatation. Pancreas: Unremarkable. No pancreatic ductal dilatation or surrounding inflammatory changes. Spleen: Normal in size without focal abnormality. Adrenals/Urinary Tract

## 2021-06-13 NOTE — ED Notes (Signed)
[facility] called to transport pt. back to her residence. ?

## 2021-06-13 NOTE — ED Triage Notes (Signed)
Pt bib ems for hyperglycemia (CBG 508 by ems) and "feces coming out of vagina" reported by pt's care aide at home.   ?

## 2021-06-13 NOTE — ED Notes (Signed)
Patient transported to CT via bariatric bed w/ 2 CT techs.  ?

## 2021-07-20 ENCOUNTER — Encounter (INDEPENDENT_AMBULATORY_CARE_PROVIDER_SITE_OTHER): Payer: Self-pay

## 2021-07-20 ENCOUNTER — Ambulatory Visit (HOSPITAL_BASED_OUTPATIENT_CLINIC_OR_DEPARTMENT_OTHER): Payer: Medicare Other | Attending: Internal Medicine | Admitting: Internal Medicine

## 2021-07-20 VITALS — Ht 62.0 in | Wt 290.0 lb

## 2021-07-20 DIAGNOSIS — G4733 Obstructive sleep apnea (adult) (pediatric): Secondary | ICD-10-CM | POA: Insufficient documentation

## 2021-07-25 DIAGNOSIS — G4733 Obstructive sleep apnea (adult) (pediatric): Secondary | ICD-10-CM | POA: Diagnosis not present

## 2021-07-25 NOTE — Procedures (Signed)
Patient Name: [name] [name], [name] [name] Date: 07/20/2021 Gender: Female D.O.B: June 17, 1955 Age (years): 7 Referring Provider: Gayland Curry DO Height (inches): 62 Interpreting Physician: Baird Lyons MD, ABSM Weight (lbs): 290 RPSGT: Carolin Coy BMI: 74 MRN: [id number] Neck Size: 17.50  CLINICAL INFORMATION The patient is referred for a BiPAP titration to treat sleep apnea.  Date of NPSG, Split Night or HST: NPSG 05/12/21  AHI 26.3/ hr, desaturation to 79%, body weight 290 lbs  SLEEP STUDY TECHNIQUE As per the AASM Manual for the Scoring of Sleep and Associated Events v2.3 (April 2016) with a hypopnea requiring 4% desaturations.  The channels recorded and monitored were frontal, central and occipital EEG, electrooculogram (EOG), submentalis EMG (chin), nasal and oral airflow, thoracic and abdominal wall motion, anterior tibialis EMG, snore microphone, electrocardiogram, and pulse oximetry. Bilevel positive airway pressure (BPAP) was initiated at the beginning of the study and titrated to treat sleep-disordered breathing.  MEDICATIONS Medications self-administered by patient taken the night of the study : none reported  RESPIRATORY PARAMETERS Optimal IPAP Pressure (cm): 28 AHI at Optimal Pressure (/hr) 0 Optimal EPAP Pressure (cm): 24   Overall Minimal O2 (%): 57.0 Minimal O2 at Optimal Pressure (%): 90.0 SLEEP ARCHITECTURE Start Time: 10:05:33 PM Stop Time: 4:39:21 AM Total Time (min): 393.8 Total Sleep Time (min): 356.5 Sleep Latency (min): 0.2 Sleep Efficiency (%): 90.5% REM Latency (min): 0.0 WASO (min): 37.1 Stage N1 (%): 13.6% Stage N2 (%): 43.2% Stage N3 (%): 0.0% Stage R (%): 43.2 Supine (%): 100.00 Arousal Index (/hr): 17.0   CARDIAC DATA The 2 lead EKG demonstrated sinus rhythm. The mean heart rate was 88.0 beats per minute. Other EKG findings include: None.  LEG MOVEMENT DATA The total Periodic Limb Movements of Sleep (PLMS) were 0. The PLMS index was 0.0.  A PLMS index of <15 is considered normal in adults.  IMPRESSIONS - CPAP titration was not effective at tolerated pressure and was changed to BIPAP titration.  - An optimal BIPAP pressure was selected for this patient ( 28 / 24 cm of water). Retail BIPAP machine max pressure is 25 cwp. - On BIPAP 25/21, residual AHI  was 9.9/ hr with mean O2 saturation 92%. - Central sleep apnea was not noted during this titration (CAI = 0.3/h). - Severe oxygen desaturations were observed during this titration (min O2 = 57.0%). Minimum O2  saturation on BIPAP 25/21 was 89%. - The patient snored with soft snoring volume. - No cardiac abnormalities were observed during this study. - Clinically significant periodic limb movements were not noted during this study. Arousals associated with PLMs were rare. - Patient not ambulatory and required care assistant. Technician could not get her to stop handling her face, which disrupted monitors and interfered with titration.  DIAGNOSIS - Obstructive Sleep Apnea (G47.33)  RECOMMENDATIONS - Trial of BiPAP therapy on 25/21 cm H2O or autoBIPAP 10-25/ 10-25. - Patient used a X-Small size Resmed Full Face Mirage Quattro mask and heated humidification. - Be careful with alcohol, sedatives and other CNS depressants that may worsen sleep apnea and disrupt normal sleep architecture. - Sleep hygiene should be reviewed to assess factors that may improve sleep quality. - Weight management and regular exercise should be initiated or continued.  [Electronically signed] 07/25/2021 01:03 PM  Baird Lyons MD, [place], American Board of Sleep Medicine NPI: [id number]                        Baird Lyons  Diplomate, Tax adviser of Sleep Medicine  ELECTRONICALLY SIGNED ON:  07/25/2021, 12:51 PM [place] PH: [phone]   FX: [phone] [place]

## 2021-07-27 ENCOUNTER — Other Ambulatory Visit: Payer: Medicare Other | Admitting: Internal Medicine

## 2021-07-27 DIAGNOSIS — E1165 Type 2 diabetes mellitus with hyperglycemia: Secondary | ICD-10-CM

## 2021-07-27 DIAGNOSIS — Z515 Encounter for palliative care: Secondary | ICD-10-CM

## 2021-07-27 DIAGNOSIS — M79652 Pain in left thigh: Secondary | ICD-10-CM

## 2021-07-27 DIAGNOSIS — G4733 Obstructive sleep apnea (adult) (pediatric): Secondary | ICD-10-CM
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
## 2021-08-01 ENCOUNTER — Encounter: Payer: Self-pay | Admitting: Internal Medicine

## 2021-08-12 ENCOUNTER — Telehealth: Payer: Self-pay | Admitting: Internal Medicine
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
## 2021-09-04 ENCOUNTER — Emergency Department (HOSPITAL_COMMUNITY): Payer: No Typology Code available for payment source

## 2021-09-04 ENCOUNTER — Inpatient Hospital Stay (HOSPITAL_COMMUNITY): Payer: No Typology Code available for payment source

## 2021-09-04 ENCOUNTER — Encounter (HOSPITAL_COMMUNITY): Payer: Self-pay | Admitting: Emergency Medicine

## 2021-09-04 ENCOUNTER — Inpatient Hospital Stay (HOSPITAL_COMMUNITY)
Admission: EM | Admit: 2021-09-04 | Discharge: 2021-09-09 | DRG: 871 | Disposition: A | Discharge status: Home / Self Care | Payer: No Typology Code available for payment source | Attending: Student | Admitting: Student

## 2021-09-04 ENCOUNTER — Other Ambulatory Visit: Payer: Self-pay

## 2021-09-04 DIAGNOSIS — Z794 Long term (current) use of insulin: Secondary | ICD-10-CM

## 2021-09-04 DIAGNOSIS — I272 Pulmonary hypertension, unspecified: Secondary | ICD-10-CM | POA: Diagnosis present

## 2021-09-04 DIAGNOSIS — R532 Functional quadriplegia: Secondary | ICD-10-CM | POA: Diagnosis present

## 2021-09-04 DIAGNOSIS — Z885 Allergy status to narcotic agent status: Secondary | ICD-10-CM

## 2021-09-04 DIAGNOSIS — Z6841 Body Mass Index (BMI) 40.0 and over, adult: Secondary | ICD-10-CM

## 2021-09-04 DIAGNOSIS — A419 Sepsis, unspecified organism: Secondary | ICD-10-CM

## 2021-09-04 DIAGNOSIS — E1165 Type 2 diabetes mellitus with hyperglycemia: Secondary | ICD-10-CM | POA: Diagnosis present

## 2021-09-04 DIAGNOSIS — R0602 Shortness of breath: Secondary | ICD-10-CM | POA: Diagnosis not present

## 2021-09-04 DIAGNOSIS — N390 Urinary tract infection, site not specified: Secondary | ICD-10-CM | POA: Diagnosis present

## 2021-09-04 DIAGNOSIS — Z79899 Other long term (current) drug therapy: Secondary | ICD-10-CM

## 2021-09-04 DIAGNOSIS — B961 Klebsiella pneumoniae [K. pneumoniae] as the cause of diseases classified elsewhere: Secondary | ICD-10-CM

## 2021-09-04 DIAGNOSIS — G9341 Metabolic encephalopathy: Secondary | ICD-10-CM | POA: Diagnosis present

## 2021-09-04 DIAGNOSIS — R7881 Bacteremia: Secondary | ICD-10-CM

## 2021-09-04 DIAGNOSIS — I248 Other forms of acute ischemic heart disease: Secondary | ICD-10-CM | POA: Diagnosis present

## 2021-09-04 DIAGNOSIS — I5033 Acute on chronic diastolic (congestive) heart failure: Secondary | ICD-10-CM | POA: Diagnosis present

## 2021-09-04 DIAGNOSIS — A4159 Other Gram-negative sepsis: Secondary | ICD-10-CM | POA: Diagnosis present

## 2021-09-04 DIAGNOSIS — J9602 Acute respiratory failure with hypercapnia: Secondary | ICD-10-CM | POA: Diagnosis present

## 2021-09-04 DIAGNOSIS — E1142 Type 2 diabetes mellitus with diabetic polyneuropathy: Secondary | ICD-10-CM | POA: Diagnosis present

## 2021-09-04 DIAGNOSIS — G4733 Obstructive sleep apnea (adult) (pediatric): Secondary | ICD-10-CM | POA: Diagnosis not present

## 2021-09-04 DIAGNOSIS — R6889 Other general symptoms and signs: Secondary | ICD-10-CM | POA: Diagnosis not present

## 2021-09-04 DIAGNOSIS — N3001 Acute cystitis with hematuria: Secondary | ICD-10-CM | POA: Diagnosis not present

## 2021-09-04 DIAGNOSIS — Z888 Allergy status to other drugs, medicaments and biological substances status: Secondary | ICD-10-CM

## 2021-09-04 DIAGNOSIS — J9601 Acute respiratory failure with hypoxia: Secondary | ICD-10-CM | POA: Diagnosis present

## 2021-09-04 DIAGNOSIS — Z880 Allergy status to penicillin: Secondary | ICD-10-CM

## 2021-09-04 DIAGNOSIS — I130 Hypertensive heart and chronic kidney disease with heart failure and stage 1 through stage 4 chronic kidney disease, or unspecified chronic kidney disease: Secondary | ICD-10-CM | POA: Diagnosis present

## 2021-09-04 DIAGNOSIS — I499 Cardiac arrhythmia, unspecified: Secondary | ICD-10-CM | POA: Diagnosis not present

## 2021-09-04 DIAGNOSIS — I10 Essential (primary) hypertension: Secondary | ICD-10-CM | POA: Diagnosis present

## 2021-09-04 DIAGNOSIS — Z743 Need for continuous supervision: Secondary | ICD-10-CM | POA: Diagnosis not present

## 2021-09-04 DIAGNOSIS — R7989 Other specified abnormal findings of blood chemistry: Secondary | ICD-10-CM | POA: Diagnosis not present

## 2021-09-04 DIAGNOSIS — Z8711 Personal history of peptic ulcer disease: Secondary | ICD-10-CM | POA: Diagnosis not present

## 2021-09-04 DIAGNOSIS — J9621 Acute and chronic respiratory failure with hypoxia: Secondary | ICD-10-CM | POA: Diagnosis present

## 2021-09-04 DIAGNOSIS — F419 Anxiety disorder, unspecified: Secondary | ICD-10-CM | POA: Diagnosis present

## 2021-09-04 DIAGNOSIS — Z20822 Contact with and (suspected) exposure to covid-19: Secondary | ICD-10-CM | POA: Diagnosis present

## 2021-09-04 DIAGNOSIS — R6520 Severe sepsis without septic shock: Secondary | ICD-10-CM | POA: Diagnosis present

## 2021-09-04 DIAGNOSIS — J9622 Acute and chronic respiratory failure with hypercapnia: Secondary | ICD-10-CM | POA: Diagnosis present

## 2021-09-04 DIAGNOSIS — E663 Overweight: Secondary | ICD-10-CM | POA: Diagnosis present

## 2021-09-04 DIAGNOSIS — E662 Morbid (severe) obesity with alveolar hypoventilation: Secondary | ICD-10-CM | POA: Diagnosis present

## 2021-09-04 DIAGNOSIS — R0902 Hypoxemia: Secondary | ICD-10-CM | POA: Diagnosis not present

## 2021-09-04 DIAGNOSIS — J90 Pleural effusion, not elsewhere classified: Secondary | ICD-10-CM | POA: Diagnosis not present

## 2021-09-04 DIAGNOSIS — Z7985 Long-term (current) use of injectable non-insulin antidiabetic drugs: Secondary | ICD-10-CM

## 2021-09-04 DIAGNOSIS — R739 Hyperglycemia, unspecified: Secondary | ICD-10-CM | POA: Diagnosis not present

## 2021-09-04 DIAGNOSIS — F32A Depression, unspecified: Secondary | ICD-10-CM

## 2021-09-04 DIAGNOSIS — N179 Acute kidney failure, unspecified: Secondary | ICD-10-CM | POA: Diagnosis present

## 2021-09-04 DIAGNOSIS — E875 Hyperkalemia: Secondary | ICD-10-CM | POA: Diagnosis present

## 2021-09-04 DIAGNOSIS — Z993 Dependence on wheelchair: Secondary | ICD-10-CM

## 2021-09-04 DIAGNOSIS — E119 Type 2 diabetes mellitus without complications: Secondary | ICD-10-CM | POA: Diagnosis present

## 2021-09-04 DIAGNOSIS — N1832 Chronic kidney disease, stage 3b: Secondary | ICD-10-CM | POA: Diagnosis present

## 2021-09-04 DIAGNOSIS — Z87891 Personal history of nicotine dependence: Secondary | ICD-10-CM

## 2021-09-04 DIAGNOSIS — R778 Other specified abnormalities of plasma proteins: Secondary | ICD-10-CM | POA: Diagnosis not present

## 2021-09-04 DIAGNOSIS — E1122 Type 2 diabetes mellitus with diabetic chronic kidney disease: Secondary | ICD-10-CM | POA: Diagnosis present

## 2021-09-04 DIAGNOSIS — R279 Unspecified lack of coordination: Secondary | ICD-10-CM | POA: Diagnosis not present

## 2021-09-04 DIAGNOSIS — N183 Chronic kidney disease, stage 3 unspecified: Secondary | ICD-10-CM | POA: Diagnosis present

## 2021-09-04 DIAGNOSIS — I959 Hypotension, unspecified: Secondary | ICD-10-CM

## 2021-09-04 DIAGNOSIS — J9690 Respiratory failure, unspecified, unspecified whether with hypoxia or hypercapnia: Secondary | ICD-10-CM | POA: Diagnosis not present

## 2021-09-04 LAB — CBC WITH DIFFERENTIAL/PLATELET
Abs Immature Granulocytes: 0.1 10*3/uL — ABNORMAL HIGH (ref 0.00–0.07)
Basophils Absolute: 0 10*3/uL (ref 0.0–0.1)
Basophils Relative: 0 %
Eosinophils Absolute: 0 10*3/uL (ref 0.0–0.5)
Eosinophils Relative: 0 %
HCT: 41 % (ref 36.0–46.0)
Hemoglobin: 12.4 g/dL (ref 12.0–15.0)
Immature Granulocytes: 1 %
Lymphocytes Relative: 8 %
Lymphs Abs: 1.2 10*3/uL (ref 0.7–4.0)
MCH: 25.2 pg — ABNORMAL LOW (ref 26.0–34.0)
MCHC: 30.2 g/dL (ref 30.0–36.0)
MCV: 83.2 fL (ref 80.0–100.0)
Monocytes Absolute: 1.7 10*3/uL — ABNORMAL HIGH (ref 0.1–1.0)
Monocytes Relative: 12 %
Neutro Abs: 11.6 10*3/uL — ABNORMAL HIGH (ref 1.7–7.7)
Neutrophils Relative %: 79 %
Platelets: 283 10*3/uL (ref 150–400)
RBC: 4.93 MIL/uL (ref 3.87–5.11)
RDW: 15.2 % (ref 11.5–15.5)
WBC: 14.7 10*3/uL — ABNORMAL HIGH (ref 4.0–10.5)
nRBC: 0 % (ref 0.0–0.2)

## 2021-09-04 LAB — COMPREHENSIVE METABOLIC PANEL
ALT: 13 U/L (ref 0–44)
AST: 13 U/L — ABNORMAL LOW (ref 15–41)
Albumin: 2.8 g/dL — ABNORMAL LOW (ref 3.5–5.0)
Alkaline Phosphatase: 139 U/L — ABNORMAL HIGH (ref 38–126)
Anion gap: 13 (ref 5–15)
BUN: 48 mg/dL — ABNORMAL HIGH (ref 8–23)
CO2: 22 mmol/L (ref 22–32)
Calcium: 8.7 mg/dL — ABNORMAL LOW (ref 8.9–10.3)
Chloride: 102 mmol/L (ref 98–111)
Creatinine, Ser: 3.57 mg/dL — ABNORMAL HIGH (ref 0.44–1.00)
GFR, Estimated: 13 mL/min — ABNORMAL LOW (ref >60)
Glucose, Bld: 391 mg/dL — ABNORMAL HIGH (ref 70–99)
Potassium: 5.9 mmol/L — ABNORMAL HIGH (ref 3.5–5.1)
Sodium: 137 mmol/L (ref 135–145)
Total Bilirubin: 0.8 mg/dL (ref 0.3–1.2)
Total Protein: 8 g/dL (ref 6.5–8.1)

## 2021-09-04 LAB — BLOOD GAS, ARTERIAL
Acid-base deficit: 1 mmol/L (ref 0.0–2.0)
Bicarbonate: 27.4 mmol/L (ref 20.0–28.0)
O2 Saturation: 99.4 %
Patient temperature: 36.6
pCO2 arterial: 60 mmHg — ABNORMAL HIGH (ref 32–48)
pH, Arterial: 7.27 — ABNORMAL LOW (ref 7.35–7.45)
pO2, Arterial: 121 mmHg — ABNORMAL HIGH (ref 83–108)

## 2021-09-04 LAB — TROPONIN I (HIGH SENSITIVITY)
Troponin I (High Sensitivity): 57 ng/L — ABNORMAL HIGH (ref <18)
Troponin I (High Sensitivity): 169 ng/L (ref <18)

## 2021-09-04 LAB — BRAIN NATRIURETIC PEPTIDE: B Natriuretic Peptide: 132.7 pg/mL — ABNORMAL HIGH (ref 0.0–100.0)

## 2021-09-04 LAB — D-DIMER, QUANTITATIVE: D-Dimer, Quant: 1.6 ug/mL-FEU — ABNORMAL HIGH (ref 0.00–0.50)

## 2021-09-04 LAB — LACTIC ACID, PLASMA: Lactic Acid, Venous: 1.4 mmol/L (ref 0.5–1.9)

## 2021-09-04 MED ORDER — INSULIN ASPART 100 UNIT/ML IJ SOLN
0.0000 [IU] | INTRAMUSCULAR | Status: DC
  Administered 2021-09-05: 4 [IU] via SUBCUTANEOUS
  Administered 2021-09-05: 20 [IU] via SUBCUTANEOUS
  Administered 2021-09-05: 11 [IU] via SUBCUTANEOUS
  Administered 2021-09-05: 4 [IU] via SUBCUTANEOUS
  Administered 2021-09-05: 7 [IU] via SUBCUTANEOUS
  Administered 2021-09-05: 3 [IU] via SUBCUTANEOUS
  Administered 2021-09-06: 4 [IU] via SUBCUTANEOUS
  Administered 2021-09-06: 3 [IU] via SUBCUTANEOUS
  Administered 2021-09-06: 4 [IU] via SUBCUTANEOUS
  Administered 2021-09-06: 7 [IU] via SUBCUTANEOUS
  Administered 2021-09-06: 11 [IU] via SUBCUTANEOUS
  Administered 2021-09-07: 3 [IU] via SUBCUTANEOUS
  Administered 2021-09-07: 4 [IU] via SUBCUTANEOUS
  Administered 2021-09-07: 3 [IU] via SUBCUTANEOUS
  Administered 2021-09-07: 4 [IU] via SUBCUTANEOUS
  Administered 2021-09-07: 7 [IU] via SUBCUTANEOUS
  Administered 2021-09-07: 4 [IU] via SUBCUTANEOUS
  Administered 2021-09-08: 3 [IU] via SUBCUTANEOUS
  Administered 2021-09-08: 4 [IU] via SUBCUTANEOUS
  Administered 2021-09-08 (×2): 3 [IU] via SUBCUTANEOUS
  Administered 2021-09-09: 7 [IU] via SUBCUTANEOUS
  Administered 2021-09-09: 4 [IU] via SUBCUTANEOUS
  Administered 2021-09-09: 3 [IU] via SUBCUTANEOUS
  Filled 2021-09-04: qty 0.2

## 2021-09-04 MED ORDER — POLYETHYLENE GLYCOL 3350 17 G PO PACK: 17.0000 g | PACK | Freq: Every day | ORAL | Status: DC | PRN

## 2021-09-04 MED ORDER — SODIUM CHLORIDE 0.9 % IV SOLN
INTRAVENOUS | Status: DC
Start: 2021-09-04 — End: 2021-09-05

## 2021-09-04 MED ORDER — METHYLPREDNISOLONE SODIUM SUCC 125 MG IJ SOLR
125.0000 mg | Freq: Once | INTRAMUSCULAR | Status: AC
  Administered 2021-09-04: 125 mg via INTRAVENOUS
  Filled 2021-09-04: qty 2

## 2021-09-04 MED ORDER — INSULIN GLARGINE-YFGN 100 UNIT/ML ~~LOC~~ SOLN
25.0000 [IU] | Freq: Every day | SUBCUTANEOUS | Status: DC
Start: 2021-09-05 — End: 2021-09-09
  Administered 2021-09-05 – 2021-09-08 (×5): 25 [IU] via SUBCUTANEOUS
  Filled 2021-09-04 (×6): qty 0.25

## 2021-09-04 MED ORDER — SODIUM CHLORIDE 0.9 % IV BOLUS
1000.0000 mL | Freq: Once | INTRAVENOUS | Status: AC
  Administered 2021-09-04: 1000 mL via INTRAVENOUS

## 2021-09-04 MED ORDER — IPRATROPIUM-ALBUTEROL 0.5-2.5 (3) MG/3ML IN SOLN
3.0000 mL | Freq: Once | RESPIRATORY_TRACT | Status: AC
  Administered 2021-09-04: 3 mL via RESPIRATORY_TRACT
  Filled 2021-09-04: qty 3

## 2021-09-04 MED ORDER — DOCUSATE SODIUM 100 MG PO CAPS: 100.0000 mg | ORAL_CAPSULE | Freq: Two times a day (BID) | ORAL | Status: DC | PRN

## 2021-09-04 MED ORDER — HEPARIN SODIUM (PORCINE) 5000 UNIT/ML IJ SOLN
5000.0000 [IU] | Freq: Three times a day (TID) | INTRAMUSCULAR | Status: DC
Start: 2021-09-04 — End: 2021-09-09
  Administered 2021-09-05 – 2021-09-09 (×14): 5000 [IU] via SUBCUTANEOUS
  Filled 2021-09-04 (×14): qty 1

## 2021-09-04 MED ORDER — ASPIRIN 81 MG PO CHEW
81.0000 mg | CHEWABLE_TABLET | Freq: Once | ORAL | Status: DC
Start: 2021-09-04 — End: 2021-09-07
  Filled 2021-09-04: qty 1

## 2021-09-04 MED ORDER — SODIUM CHLORIDE 0.9 % IV BOLUS
500.0000 mL | Freq: Once | INTRAVENOUS | Status: AC
  Administered 2021-09-04: 500 mL via INTRAVENOUS

## 2021-09-04 MED ORDER — SODIUM CHLORIDE 0.9 % IV SOLN
2.0000 g | Freq: Two times a day (BID) | INTRAVENOUS | Status: DC
  Administered 2021-09-05 (×2): 2 g via INTRAVENOUS
  Filled 2021-09-04 (×2): qty 12.5

## 2021-09-04 MED ORDER — CEFEPIME HCL 2 G IV SOLR
2.0000 g | Freq: Once | INTRAVENOUS | Status: AC
  Administered 2021-09-04: 2 g via INTRAVENOUS
  Filled 2021-09-04: qty 12.5

## 2021-09-04 MED ORDER — SODIUM ZIRCONIUM CYCLOSILICATE 10 G PO PACK
10.0000 g | PACK | Freq: Once | ORAL | Status: DC
  Filled 2021-09-04: qty 1

## 2021-09-04 MED ORDER — ONDANSETRON HCL 4 MG/2ML IJ SOLN: 4.0000 mg | Freq: Four times a day (QID) | INTRAMUSCULAR | Status: DC | PRN

## 2021-09-04 NOTE — ED Triage Notes (Signed)
BIB [facility] Per EMS: Pt coming from home w/ c/o SHOB, hypoxic at 60% when EMS arrived. 6L o2 placed on pt- 95% 6LNC. Does not wear O2 normally.  Hx diabetes.  392 CBG  150/100 BP 120 HR

## 2021-09-04 NOTE — Progress Notes (Signed)
Pharmacy Antibiotic Note  [name] [name] is a 66 y.o. female admitted on 09/04/2021 with SOB.  Pt became hypotensive in the ED. Pharmacy has been consulted for cefepime dosing.  Plan: Cefepime 2gm IV q12h Follow renal function, cultures and clinical course    Temp (24hrs), Avg:98.1 F (36.7 C), Min:98.1 F (36.7 C), Max:98.1 F (36.7 C)  Recent Labs  Lab 09/04/21 1858 09/04/21 1944  WBC 14.7*  --   CREATININE 3.57*  --   LATICACIDVEN  --  1.4    CrCl cannot be calculated (Unknown ideal weight.).    Allergies  Allergen Reactions   Lisinopril Swelling   Morphine Itching and Rash   Penicillins Hives, Itching, Nausea And Vomiting and Rash    Has patient had a PCN reaction causing immediate rash, facial/tongue/throat swelling, SOB or lightheadedness with hypotension: No Has patient had a PCN reaction causing severe rash involving mucus membranes or skin necrosis: No Has patient had a PCN reaction that required hospitalization: No Has patient had a PCN reaction occurring within the last 10 years: No If all of the above answers are "NO", then may proceed with Cephalosporin use.     Antimicrobials this admission: 7/15 cefepime >>  Dose adjustments this admission:   Microbiology results: 7/15 BCx:  7/15 UCx:   Thank you for allowing pharmacy to be a part of this patient's care.  Dolly Rias RPh 09/04/2021, 11:44 PM

## 2021-09-04 NOTE — ED Provider Notes (Signed)
[facility] DEPT Provider Note   CSN: [id number] Arrival date & time: 09/04/21  1837     History  Chief Complaint  Patient presents with   Shortness of [name] [name] is a 66 y.o. female.  Patient presents with complaint of shortness of breath.  Symptoms ongoing for the past 2 days.  Denies any associated headache or chest pain no abdominal pain.  No reports of cough, however positive subjective fevers at home today.  Denies vomiting or diarrhea, also complaining of bilateral lower extremity peripheral neuropathy ongoing for several years per patient.       Home Medications Prior to Admission medications   Medication Sig Start Date End Date Taking? Authorizing Provider  acetaminophen (TYLENOL) 500 MG tablet Take 500 mg by mouth every 6 (six) hours as needed. 07/24/20   [provider]  albuterol (VENTOLIN HFA) 108 (90 Base) MCG/ACT inhaler Inhale 2 puffs into the lungs every 6 (six) hours as needed for wheezing or shortness of breath. 08/06/20   Aline August, MD  atorvastatin (LIPITOR) 10 MG tablet Take 10 mg by mouth at bedtime.    [provider]  Carboxymethylcellulose Sodium 0.25 % SOLN Place 1 drop into both eyes 4 (four) times daily as needed (dry eyes).    [provider]  cetirizine (ZYRTEC) 10 MG tablet Take 10 mg by mouth daily. 11/20/20   [provider]  Ensure (ENSURE) Take 237 mLs by mouth 2 (two) times daily between meals.    [provider]  escitalopram (LEXAPRO) 10 MG tablet Take 1 tablet (10 mg total) by mouth every morning. 08/01/21   Mariea Clonts, Tiffany L, DO  furosemide (LASIX) 40 MG tablet Take 40 mg by mouth every Monday, Wednesday, and Friday. 02/09/21   [provider]  glucose 4 GM chewable tablet Chew 4 tablets by mouth as needed for low blood sugar.    [provider]  HYDROcodone-acetaminophen (NORCO/VICODIN) 5-325 MG tablet Take 1 tablet by mouth 3  (three) times daily as needed (pain). 05/10/21   [provider]  hydrOXYzine (ATARAX) 25 MG tablet Take 25 mg by mouth 3 (three) times daily. 08/10/21   [provider]  hydrOXYzine (VISTARIL) 25 MG capsule Take 25 mg by mouth 3 (three) times daily as needed for itching or anxiety. 10/30/20   [provider]  ibuprofen (ADVIL) 800 MG tablet Take 1 tablet (800 mg total) by mouth every 8 (eight) hours as needed. 06/13/21   Davonna Belling, MD  insulin glargine-yfgn (SEMGLEE) 100 UNIT/ML Pen 52 Units daily. 06/29/21   [provider]  Insulin Pen Needle 31G X 5 MM MISC Use as directed daily 02/03/21   Dessa Phi, DO  mirtazapine (REMERON) 15 MG tablet Take 15 mg by mouth at bedtime as needed (sleep). 11/06/20   [provider]  nystatin (MYCOSTATIN/NYSTOP) powder Apply to affected area 2 (two) times daily to groin and areas of fungal infection 02/03/21   Dessa Phi, DO  nystatin cream (MYCOSTATIN) Apply 1 Application topically 2 (two) times daily. 05/10/21   [provider]  pantoprazole (PROTONIX) 40 MG tablet Take 40 mg by mouth daily before breakfast.    [provider]  potassium chloride SA (KLOR-CON M) 20 MEQ tablet 20 mEq every Monday, Wednesday, and Friday. 02/12/21   [provider]  pregabalin (LYRICA) 75 MG capsule Take 75 mg by mouth 2 (two) times daily.    [provider]  Semaglutide,0.25 or  0.'5MG'$ /DOS, 2 MG/1.5ML SOPN Inject 2 mg into the skin every Thursday. 02/24/21   [provider]      Allergies    Lisinopril, Morphine, and Penicillins    Review of Systems   Review of Systems  Constitutional:  Positive for fever.  HENT:  Negative for ear pain.   Eyes:  Negative for pain.  Respiratory:  Negative for cough.   Cardiovascular:  Negative for chest pain.  Gastrointestinal:  Negative for abdominal pain.  Genitourinary:  Negative for flank pain.  Musculoskeletal:  Negative for back pain.   Skin:  Negative for rash.  Neurological:  Negative for headaches.    Physical Exam Updated Vital Signs BP 106/81   Pulse 91   Temp 98.1 F (36.7 C) (Oral)   Resp (!) 24   SpO2 98%  Physical Exam Constitutional:      General: She is not in acute distress.    Appearance: Normal appearance.  HENT:     Head: Normocephalic.     Nose: Nose normal.  Eyes:     Extraocular Movements: Extraocular movements intact.  Cardiovascular:     Rate and Rhythm: Tachycardia present.  Pulmonary:     Effort: Tachypnea present.     Breath sounds: Decreased breath sounds and wheezing present.  Musculoskeletal:        General: Normal range of motion.     Cervical back: Normal range of motion.  Neurological:     General: No focal deficit present.     Mental Status: She is alert. Mental status is at baseline.     ED Results / Procedures / Treatments   Labs (all labs ordered are listed, but only abnormal results are displayed) Labs Reviewed  CBC WITH DIFFERENTIAL/PLATELET - Abnormal; Notable for the following components:      Result Value   WBC 14.7 (*)    MCH 25.2 (*)    Neutro Abs 11.6 (*)    Monocytes Absolute 1.7 (*)    Abs Immature Granulocytes 0.10 (*)    All other components within normal limits  BRAIN NATRIURETIC PEPTIDE - Abnormal; Notable for the following components:   B Natriuretic Peptide 132.7 (*)    All other components within normal limits  COMPREHENSIVE METABOLIC PANEL - Abnormal; Notable for the following components:   Potassium 5.9 (*)    Glucose, Bld 391 (*)    BUN 48 (*)    Creatinine, Ser 3.57 (*)    Calcium 8.7 (*)    Albumin 2.8 (*)    AST 13 (*)    Alkaline Phosphatase 139 (*)    GFR, Estimated 13 (*)    All other components within normal limits  D-DIMER, QUANTITATIVE - Abnormal; Notable for the following components:   D-Dimer, Quant 1.60 (*)    All other components within normal limits  TROPONIN I (HIGH SENSITIVITY) - Abnormal; Notable for the following  components:   Troponin I (High Sensitivity) 57 (*)    All other components within normal limits  CULTURE, BLOOD (ROUTINE X 2)  CULTURE, BLOOD (ROUTINE X 2)  URINE CULTURE  LACTIC ACID, PLASMA  LACTIC ACID, PLASMA  URINALYSIS, ROUTINE W REFLEX MICROSCOPIC  BLOOD GAS, ARTERIAL  TROPONIN I (HIGH SENSITIVITY)    EKG None  Radiology CT CHEST WO CONTRAST  Result Date: 09/04/2021 CLINICAL DATA:  Positive D-dimer, shortness of breath, hypoxia EXAM: CT CHEST WITHOUT CONTRAST TECHNIQUE: Multidetector CT imaging of the chest was performed following the standard protocol without IV contrast. RADIATION DOSE  REDUCTION: This exam was performed according to the departmental dose-optimization program which includes automated exposure control, adjustment of the mA and/or kV according to patient size and/or use of iterative reconstruction technique. COMPARISON:  09/04/2021 FINDINGS: Cardiovascular: Unenhanced imaging of the heart demonstrates cardiomegaly without pericardial effusion. Normal caliber of the thoracic aorta. Atherosclerosis of the aorta and coronary vasculature. Evaluation of the vascular lumen is limited without IV contrast. Pulmonary emboli cannot be evaluated on unenhanced exam. Mediastinum/Nodes: No enlarged mediastinal or axillary lymph nodes. Thyroid gland, trachea, and esophagus demonstrate no significant findings. Lungs/Pleura: No acute airspace disease, effusion, or pneumothorax. Scattered hypoventilatory changes are seen within the dependent lower lobes. Central airways are patent. Upper Abdomen: No acute abnormality. Musculoskeletal: No acute or destructive bony lesions. Reconstructed images demonstrate no additional findings. IMPRESSION: 1. No acute intrathoracic process. Please note that pulmonary emboli cannot be excluded without intravenous contrast administration. 2. Cardiomegaly. 3. Aortic Atherosclerosis (ICD10-I70.0). Coronary artery atherosclerosis. Electronically Signed   By:  Randa Ngo M.D.   On: 09/04/2021 21:44   DG Chest Port 1 View  Result Date: 09/04/2021 CLINICAL DATA:  Shortness of breath.  Hypoxia. EXAM: PORTABLE CHEST 1 VIEW COMPARISON:  02/01/2021 FINDINGS: Patient is rotated. Lung volumes are low limiting assessment. Stable heart size and mediastinal contours, borderline cardiomegaly. There is mild peribronchial thickening. No focal airspace disease. No pneumothorax or large pleural effusion. IMPRESSION: Low lung volumes with peribronchial thickening that may be pulmonary edema or bronchitis. Electronically Signed   By: Keith Rake M.D.   On: 09/04/2021 19:15    Procedures Procedures    Medications Ordered in ED Medications  ceFEPIme (MAXIPIME) 2 g in sodium chloride 0.9 % 100 mL IVPB (2 g Intravenous New Bag/Given 09/04/21 2231)  sodium chloride 0.9 % bolus 1,000 mL (has no administration in time range)  sodium chloride 0.9 % bolus 500 mL (0 mLs Intravenous Stopped 09/04/21 2149)  sodium chloride 0.9 % bolus 1,000 mL (0 mLs Intravenous Stopped 09/04/21 2241)  methylPREDNISolone sodium succinate (SOLU-MEDROL) 125 mg/2 mL injection 125 mg (125 mg Intravenous Given 09/04/21 2219)  ipratropium-albuterol (DUONEB) 0.5-2.5 (3) MG/3ML nebulizer solution 3 mL (3 mLs Nebulization Given 09/04/21 2221)    ED Course/ Medical Decision Making/ A&P                           Medical Decision Making Amount and/or Complexity of Data Reviewed Labs: ordered. Radiology: ordered.  Risk Prescription drug management.   Review of records shows visit yesterday for diabetes and diabetic neuropathy on outpatient basis.  Review of EMS report states that the patient was hypoxic at 60% on room air on arrival at her home.  Cardiac monitoring showing sinus rhythm tachycardic rate.  Comorbidities influencing complexity include history of morbid obesity.  Diagnosis studies were sent.  Chest x-ray ordered and labs pursued.  Patient maintaining O2 sats greater 94% on 2  L nasal cannula.  After some time in the ER she became hypotensive.  Additional IV fluids provided.  White count elevated 14 lactic acid normal.  Caregiver states that she has had fevers on and off for the past 2 days.  Evaluation of her backside shows no decubitus ulcer noted.  Straight catheter urinalysis being attempted.  Patient is hypotensive despite IV fluid resuscitation, started on IV antibiotics for concern for sepsis.  Consultation with ICU given her morbid obesity, hypotension, hypoxemia.        Final Clinical Impression(s) / ED Diagnoses Final  diagnoses:  Sepsis, due to unspecified organism, unspecified whether acute organ dysfunction present ([place])  Hypotension, unspecified hypotension type    Rx / DC Orders ED Discharge Orders     None         Luna Fuse, MD 09/04/21 2242

## 2021-09-04 NOTE — ED Notes (Signed)
Pt hypotensive. Provider notified. Bolus started

## 2021-09-04 NOTE — ED Notes (Signed)
Dr. Elsworth Soho at [facility]
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
## 2021-09-05 ENCOUNTER — Inpatient Hospital Stay (HOSPITAL_COMMUNITY): Payer: No Typology Code available for payment source

## 2021-09-05 DIAGNOSIS — R7989 Other specified abnormal findings of blood chemistry: Secondary | ICD-10-CM

## 2021-09-05 DIAGNOSIS — R0602 Shortness of breath: Secondary | ICD-10-CM

## 2021-09-05 DIAGNOSIS — R778 Other specified abnormalities of plasma proteins: Secondary | ICD-10-CM | POA: Diagnosis not present

## 2021-09-05 DIAGNOSIS — J9602 Acute respiratory failure with hypercapnia: Secondary | ICD-10-CM

## 2021-09-05 DIAGNOSIS — J9601 Acute respiratory failure with hypoxia: Secondary | ICD-10-CM | POA: Diagnosis not present

## 2021-09-05 LAB — CBC
HCT: 39.1 % (ref 36.0–46.0)
Hemoglobin: 11.7 g/dL — ABNORMAL LOW (ref 12.0–15.0)
MCH: 25.8 pg — ABNORMAL LOW (ref 26.0–34.0)
MCHC: 29.9 g/dL — ABNORMAL LOW (ref 30.0–36.0)
MCV: 86.3 fL (ref 80.0–100.0)
Platelets: 267 10*3/uL (ref 150–400)
RBC: 4.53 MIL/uL (ref 3.87–5.11)
RDW: 15.4 % (ref 11.5–15.5)
WBC: 13 10*3/uL — ABNORMAL HIGH (ref 4.0–10.5)
nRBC: 0 % (ref 0.0–0.2)

## 2021-09-05 LAB — BASIC METABOLIC PANEL
Anion gap: 10 (ref 5–15)
BUN: 51 mg/dL — ABNORMAL HIGH (ref 8–23)
CO2: 20 mmol/L — ABNORMAL LOW (ref 22–32)
Calcium: 8.1 mg/dL — ABNORMAL LOW (ref 8.9–10.3)
Chloride: 107 mmol/L (ref 98–111)
Creatinine, Ser: 3.8 mg/dL — ABNORMAL HIGH (ref 0.44–1.00)
GFR, Estimated: 12 mL/min — ABNORMAL LOW (ref >60)
Glucose, Bld: 332 mg/dL — ABNORMAL HIGH (ref 70–99)
Potassium: 4.9 mmol/L (ref 3.5–5.1)
Sodium: 137 mmol/L (ref 135–145)

## 2021-09-05 LAB — URINALYSIS, ROUTINE W REFLEX MICROSCOPIC
Bilirubin Urine: NEGATIVE
Glucose, UA: NEGATIVE mg/dL
Ketones, ur: NEGATIVE mg/dL
Nitrite: NEGATIVE
Protein, ur: >=300 mg/dL — AB
Specific Gravity, Urine: 1.013 (ref 1.005–1.030)
WBC, UA: >50 WBC/hpf — ABNORMAL HIGH (ref 0–5)
pH: 5 (ref 5.0–8.0)

## 2021-09-05 LAB — ECHOCARDIOGRAM COMPLETE
AR max vel: 2.19 cm2
AV Area VTI: 2.09 cm2
AV Area mean vel: 2.28 cm2
AV Mean grad: 1.1 mmHg
AV Peak grad: 2.6 mmHg
Ao pk vel: 0.8 m/s
S' Lateral: 2.2 cm

## 2021-09-05 LAB — CREATININE, URINE, RANDOM: Creatinine, Urine: 135 mg/dL

## 2021-09-05 LAB — SODIUM, URINE, RANDOM: Sodium, Ur: 72 mmol/L

## 2021-09-05 LAB — TROPONIN I (HIGH SENSITIVITY)
Troponin I (High Sensitivity): 367 ng/L (ref <18)
Troponin I (High Sensitivity): 389 ng/L (ref <18)

## 2021-09-05 LAB — PROCALCITONIN: Procalcitonin: 16.37 ng/mL

## 2021-09-05 LAB — SARS CORONAVIRUS 2 BY RT PCR: SARS Coronavirus 2 by RT PCR: NEGATIVE

## 2021-09-05 LAB — GLUCOSE, CAPILLARY
Glucose-Capillary: 284 mg/dL — ABNORMAL HIGH (ref 70–99)
Glucose-Capillary: 244 mg/dL — ABNORMAL HIGH (ref 70–99)
Glucose-Capillary: 179 mg/dL — ABNORMAL HIGH (ref 70–99)
Glucose-Capillary: 163 mg/dL — ABNORMAL HIGH (ref 70–99)
Glucose-Capillary: 149 mg/dL — ABNORMAL HIGH (ref 70–99)

## 2021-09-05 LAB — CBG MONITORING, ED: Glucose-Capillary: 365 mg/dL — ABNORMAL HIGH (ref 70–99)

## 2021-09-05 LAB — MRSA NEXT GEN BY PCR, NASAL: MRSA by PCR Next Gen: DETECTED — AB

## 2021-09-05 MED ORDER — LACTATED RINGERS IV SOLN
INTRAVENOUS | Status: DC
Start: 2021-09-05 — End: 2021-09-06

## 2021-09-05 MED ORDER — CHLORHEXIDINE GLUCONATE CLOTH 2 % EX PADS
6.0000 | MEDICATED_PAD | Freq: Every day | CUTANEOUS | Status: DC
  Administered 2021-09-05 – 2021-09-09 (×4): 6 via TOPICAL

## 2021-09-05 NOTE — Progress Notes (Signed)
BLE venous duplex has been completed.  Somewhat limited exam due to habitus.   Results can be found under chart review under CV PROC. 09/05/2021 11:27 AM Petro Talent RVT, RDMS

## 2021-09-05 NOTE — Progress Notes (Signed)
eLink Physician-Brief Progress Note Patient Name: [name] [name] DOB: 05-26-1955 MRN: [id number]   Date of Service  09/05/2021  HPI/Events of Note  58 F obese (BMI 58), DM (A1C 6), CKD, OSA on BiPap, presented with SOB and hypoxia with subjective fevers. Episode of hypotension in the ED resolved with fluid bolus. CXR pulmonary edema vs bronchitis. Cr 3.57, K 5.9, glucose 391, urine WBC > 50, ketones negative  ABG 7.27/60/121  eICU Interventions  Sepsis likely pulmonary, possibly urinary, source on cefepime Acute on chronic hypercapneic and and hypoxemic respiratory failure, OSA/OHS, on BiPap Acute on CKD with hyperkalemia, on Lokelma. Continued on fluids Hyperglycemia not in DKA on basal and SSI     Intervention Category Evaluation Type: New Patient Evaluation  Judd Lien 09/05/2021, 1:45 AM

## 2021-09-05 NOTE — Progress Notes (Signed)
Patient transferred from ED to ICU on  Bipap with no issues.

## 2021-09-05 NOTE — Progress Notes (Addendum)
Spoke with Dr. Vaughan Browner concerning ordered ABG. RT unable to obtain. PT is aware of time, place and self. PT does not appear to be in distress at this time.

## 2021-09-05 NOTE — Progress Notes (Addendum)
NAME:  [name] [name], MRN:  [id number], DOB:  [birth year], LOS: 1 ADMISSION DATE:  09/04/2021, CONSULTATION DATE:  09/05/2021  REFERRING MD:  Sarita Haver, CHIEF COMPLAINT: Hypotension  History of Present Illness:  66 year old morbidly obese woman BIBEMS from home for shortness of breath, saturation initially 60%, required 6 L were rapidly titrated to 2 L, CBG 392, complains of subjective fevers.  Initially normotensive but then developed hypotension requiring fluid bolus, normal lactate. Labs showed AKI with BUN/creatinine 48/3.6, potassium was 5.9, mild leukocytosis 15 K , troponin 57 CT chest without contrast showed cardiomegaly without acute airspace disease Blood pressure improved but has become more somnolent during ED stay, hence PCCM consulted  Pertinent  Medical History  Morbidly obese Diabetes type 2 with peripheral neuropathy, on insulin, wheelchair-bound CKD Echo 07/2020 mild pulm hypertension, normal LVEF 10/2019 duodenal ulcer bleeding requiring embolization OSA on BiPAP 25/21/auto BiPAP -Being followed by outpatient palliative care  Significant Hospital Events: Including procedures, antibiotic start and stop dates in addition to other pertinent events   7/15-admit  Interim History / Subjective:   Stared on bipap overnight. Remains lethargic.   Objective   Blood pressure 92/64, pulse 79, temperature (!) 97.1 F (36.2 C), temperature source Axillary, resp. rate 19, SpO2 100 %.    FiO2 (%):  [40 %] 40 %   Intake/Output Summary (Last 24 hours) at 09/05/2021 0745 Last data filed at 09/04/2021 2346 Gross per 24 hour  Intake --  Output 20 ml  Net -20 ml   There were no vitals filed for this visit.  Examination: Blood pressure 92/64, pulse 79, temperature (!) 97.1 F (36.2 C), temperature source Axillary, resp. rate 19, SpO2 100 %. Gen:      Morbidly obese HEENT:  EOMI, sclera anicteric Neck:     No masses; no thyromegaly Lungs:    Diminished breath sounds  bilaterally.  No wheeze appreciated CV:         Regular rate and rhythm; no murmurs Abd:      + bowel sounds; soft, non-tender; no palpable masses, no distension Ext:    No edema; adequate peripheral perfusion Skin:      Warm and dry; no rash Neuro: Somnolent, arouses to stimuli.  Able to tell me name and answer simple questions.  Falls back asleep.  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  Changed BiPAP settings to increase minute ventilation.  Recheck ABG in 2 hours The D-dimer is slightly elevated doubt PE as a reason for presentation.  Unable to get contrast due to AKI Echocardiogram and lower extremity ultrasound is pending to evaluate RV function and DVT  Elevated troponin May be secondary to demand.  EKG is unremarkable Repeat troponin Echocardiogram as above.  Acute metabolic encephalopathy secondary to sepsis, hypercarbia Monitor If no improvement then consider head CT  AKI Hyperkalemia > improved with Lokelma Ultrasound noted with no hydronephrosis Continue fluids.  Change normal saline to LR  Uncontrolled diabetes, insulin requiring SSI, Semglee  Best Practice (right click and "Reselect all SmartList Selections" daily)   Diet/type: NPO DVT prophylaxis: prophylactic heparin  GI prophylaxis: N/A Lines: N/A Foley:  Yes, and it is still needed Code Status:  full code Last date of multidisciplinary goals of care discussion [NA]. Called Son who is listed as but number is inactive. Updated sister Isaac Bliss on 7/16  Critical  care time:    The patient is critically ill with multiple organ system failure and requires high complexity decision making for assessment and support, frequent evaluation and titration of therapies, advanced monitoring, review of radiographic studies and interpretation of complex data.   Critical Care Time devoted to patient care services, exclusive of separately billable procedures, described in this note is 35 minutes.   Marshell Garfinkel MD [facility] See Amion for pager  If no response to pager , please call [phone] until 7pm After 7:00 pm call Elink  [phone] 09/05/2021, 7:46 AM

## 2021-09-05 NOTE — Progress Notes (Signed)
Removed BiPAP from PT at approximately 1115. Placed PT on 3 LPM nasal cannula (PT states she has 02 at home but does not always wear but when she does it s set at 2 LPM). PT is alert and orientated x 4 (current having conversation on phone with sister). PT does not appear to be in respiratory distress at this time. RT asked PT and RN to report to RT if PT becomes sleepy or distressed so that BiPAP can be reapplied. Dr. Vaughan Browner is aware that PT is off BiPAP and he requested that PT have breaks and to also utilize BiPAP during all hours of sleep.

## 2021-09-06 ENCOUNTER — Inpatient Hospital Stay (HOSPITAL_COMMUNITY): Payer: No Typology Code available for payment source

## 2021-09-06 DIAGNOSIS — J9601 Acute respiratory failure with hypoxia: Secondary | ICD-10-CM | POA: Diagnosis not present

## 2021-09-06 DIAGNOSIS — J9602 Acute respiratory failure with hypercapnia: Secondary | ICD-10-CM | POA: Diagnosis not present

## 2021-09-06 LAB — BASIC METABOLIC PANEL
Anion gap: 13 (ref 5–15)
Anion gap: 9 (ref 5–15)
BUN: 53 mg/dL — ABNORMAL HIGH (ref 8–23)
BUN: 51 mg/dL — ABNORMAL HIGH (ref 8–23)
CO2: 18 mmol/L — ABNORMAL LOW (ref 22–32)
CO2: 21 mmol/L — ABNORMAL LOW (ref 22–32)
Calcium: 8.4 mg/dL — ABNORMAL LOW (ref 8.9–10.3)
Calcium: 8.7 mg/dL — ABNORMAL LOW (ref 8.9–10.3)
Chloride: 110 mmol/L (ref 98–111)
Chloride: 109 mmol/L (ref 98–111)
Creatinine, Ser: 3.39 mg/dL — ABNORMAL HIGH (ref 0.44–1.00)
Creatinine, Ser: 3.11 mg/dL — ABNORMAL HIGH (ref 0.44–1.00)
GFR, Estimated: 14 mL/min — ABNORMAL LOW (ref >60)
GFR, Estimated: 16 mL/min — ABNORMAL LOW (ref >60)
Glucose, Bld: 132 mg/dL — ABNORMAL HIGH (ref 70–99)
Glucose, Bld: 223 mg/dL — ABNORMAL HIGH (ref 70–99)
Potassium: 5.3 mmol/L — ABNORMAL HIGH (ref 3.5–5.1)
Potassium: 4.7 mmol/L (ref 3.5–5.1)
Sodium: 141 mmol/L (ref 135–145)
Sodium: 139 mmol/L (ref 135–145)

## 2021-09-06 LAB — BLOOD CULTURE ID PANEL (REFLEXED) - BCID2

## 2021-09-06 LAB — GLUCOSE, CAPILLARY
Glucose-Capillary: 113 mg/dL — ABNORMAL HIGH (ref 70–99)
Glucose-Capillary: 117 mg/dL — ABNORMAL HIGH (ref 70–99)
Glucose-Capillary: 127 mg/dL — ABNORMAL HIGH (ref 70–99)
Glucose-Capillary: 169 mg/dL — ABNORMAL HIGH (ref 70–99)
Glucose-Capillary: 195 mg/dL — ABNORMAL HIGH (ref 70–99)
Glucose-Capillary: 224 mg/dL — ABNORMAL HIGH (ref 70–99)
Glucose-Capillary: 265 mg/dL — ABNORMAL HIGH (ref 70–99)

## 2021-09-06 LAB — TROPONIN I (HIGH SENSITIVITY): Troponin I (High Sensitivity): 223 ng/L (ref <18)

## 2021-09-06 LAB — CBC
HCT: 35 % — ABNORMAL LOW (ref 36.0–46.0)
Hemoglobin: 10.1 g/dL — ABNORMAL LOW (ref 12.0–15.0)
MCH: 25.5 pg — ABNORMAL LOW (ref 26.0–34.0)
MCHC: 28.9 g/dL — ABNORMAL LOW (ref 30.0–36.0)
MCV: 88.4 fL (ref 80.0–100.0)
Platelets: 252 10*3/uL (ref 150–400)
RBC: 3.96 MIL/uL (ref 3.87–5.11)
RDW: 15.2 % (ref 11.5–15.5)
WBC: 11.7 10*3/uL — ABNORMAL HIGH (ref 4.0–10.5)
nRBC: 0 % (ref 0.0–0.2)

## 2021-09-06 MED ORDER — SODIUM CHLORIDE 0.9 % IV SOLN
2.0000 g | INTRAVENOUS | Status: DC
  Administered 2021-09-06 – 2021-09-08 (×3): 2 g via INTRAVENOUS
  Filled 2021-09-06 (×3): qty 20

## 2021-09-06 MED ORDER — MUPIROCIN 2 % EX OINT
1.0000 | TOPICAL_OINTMENT | Freq: Two times a day (BID) | CUTANEOUS | Status: DC
  Administered 2021-09-06 – 2021-09-09 (×7): 1 via NASAL
  Filled 2021-09-06 (×2): qty 22

## 2021-09-06 MED ORDER — MIRTAZAPINE 15 MG PO TABS
7.5000 mg | ORAL_TABLET | Freq: Every evening | ORAL | Status: DC | PRN
  Administered 2021-09-06: 7.5 mg via ORAL
  Filled 2021-09-06: qty 1

## 2021-09-06 MED ORDER — CHLORHEXIDINE GLUCONATE CLOTH 2 % EX PADS
6.0000 | MEDICATED_PAD | Freq: Every day | CUTANEOUS | Status: DC
  Administered 2021-09-06 – 2021-09-09 (×3): 6 via TOPICAL

## 2021-09-06 MED ORDER — FUROSEMIDE 10 MG/ML IJ SOLN
120.0000 mg | Freq: Once | INTRAMUSCULAR | Status: AC
Start: 2021-09-06 — End: 2021-09-06
  Administered 2021-09-06: 120 mg via INTRAVENOUS
  Filled 2021-09-06: qty 12

## 2021-09-06 NOTE — Progress Notes (Addendum)
NAME:  [name] [name], MRN:  [id number], DOB:  1955-10-06, LOS: 2 ADMISSION DATE:  09/04/2021, CONSULTATION DATE:  09/06/2021  REFERRING MD:  Sarita Haver, CHIEF COMPLAINT: Hypotension  History of Present Illness:  66 year old morbidly obese woman BIBEMS from home for shortness of breath, saturation initially 60%, required 6 L were rapidly titrated to 2 L, CBG 392, complains of subjective fevers.  Initially normotensive but then developed hypotension requiring fluid bolus, normal lactate. Labs showed AKI with BUN/creatinine 48/3.6, potassium was 5.9, mild leukocytosis 15 K , troponin 57 CT chest without contrast showed cardiomegaly without acute airspace disease Blood pressure improved but has become more somnolent during ED stay, hence PCCM consulted  Pertinent  Medical History  Morbidly obese Diabetes type 2 with peripheral neuropathy, on insulin, wheelchair-bound CKD Echo 07/2020 mild pulm hypertension, normal LVEF 10/2019 duodenal ulcer bleeding requiring embolization OSA on BiPAP 25/21/auto BiPAP -Being followed by outpatient palliative care  Significant Hospital Events: Including procedures, antibiotic start and stop dates in addition to other pertinent events   7/15-admit  Interim History / Subjective:   This morning awake, alert, oriented, asking for breakfast.   Objective   Blood pressure (!) 100/41, pulse 80, temperature 98.4 F (36.9 C), temperature source Oral, resp. rate (!) 24, weight 132 kg, SpO2 96 %.    FiO2 (%):  [30 %] 30 %   Intake/Output Summary (Last 24 hours) at 09/06/2021 0902 Last data filed at 09/05/2021 1554 Gross per 24 hour  Intake 1120.17 ml  Output 100 ml  Net 1020.17 ml   Filed Weights   09/06/21 0405  Weight: 132 kg    Examination: Blood pressure (!) 100/41, pulse 80, temperature 98.4 F (36.9 C), temperature source Oral, resp. rate (!) 24, weight 132 kg, SpO2 96 %. Gen:      Awake, laying flat comfortably without respiratory  distress HEENT:  mmm on nasal cannula, obese, no neck Lungs:    diminished bilaterally no wheezes CV:         RRR no mrg, diminished Abd:      + bowel sounds; soft, non-tender; distended, soft Ext:    Non pitting edema in lower extremities Skin:      Warm and dry; no rashes Neuro:   awake, alert oriented, normal speech, moves all 4 extremities  Labs/imaging personally reviewed K 5.3 Trop 223(downtrending) K 3.39 (downtrending) WBC 11.7(downtrending)   Resolved Hospital Problem list    Severe Sepsis resolved Encephalopathy resolved  Assessment & Plan:  UTI Will narrow cefepime to ceftriaxone for UTI Await Urine culture  Acute hypoxic and hypercarbic respiratory failure OSA on home BiPAP 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 She does not wear at night because her home aide that helps her get ready for bed leaves before 8pm before she can put her CPAP on.  She does feel better when she wears it.  Echocardiogram reviewed - Chronic RV failure, diastolic dysfunction  Acute decompensated HFpEF Elevated troponin May be secondary to demand.  EKG is unremarkable Repeat troponin downtrending Echocardiogram as above.  AKI Hyperkalemia  Ultrasound noted with no hydronephrosis Will stop IV fluids. I think she is developing volume overload. Takes lasix MWF at home. Will diurese x1 today and repeat BMET.  Aki likely multifactorial from UTI, decompensated HFpEF. Consider renal consult tomorrow if not continuing to improve.   Uncontrolled diabetes, insulin requiring SSI, Semglee Monitor closely as she tolerates PO today - will probably need  carb coverage.   Best Practice (right click and "Reselect all SmartList Selections" daily)   Diet/type: Regular consistency (see orders) DVT prophylaxis: prophylactic heparin  GI prophylaxis: N/A Lines: N/A Foley:  Yes, and it is still needed Code Status:  full code Last date of  multidisciplinary goals of care discussion [n/a, patient updated at bedside, family member over the phone while in the room.]  Ok to transfer to med surg floor today. Will s/o to [facility] to assume care 7/18  I spent 50 minutes in total visit time for this patient, with more than 50% spent counseling/coordinating care.   Lenice Llamas, MD [facility] [place] 09/06/2021 9:14 AM Pager: see AMION  If no response to pager, please call critical care on call (see AMION) until 7pm After 7:00 pm call Elink

## 2021-09-06 NOTE — Plan of Care (Signed)
  Problem: Nutrition: Goal: Adequate nutrition will be maintained Outcome: Progressing   Problem: Coping: Goal: Level of anxiety will decrease Outcome: Progressing   Problem: Pain Managment: Goal: General experience of comfort will improve Outcome: Progressing   

## 2021-09-06 NOTE — Progress Notes (Signed)
Pt currently awake and off BIPAP.  Pt on 3 LPM [place] and tolerating well.

## 2021-09-06 NOTE — Progress Notes (Signed)
PHARMACY - PHYSICIAN COMMUNICATION CRITICAL VALUE ALERT - BLOOD CULTURE IDENTIFICATION (BCID)  [name] [name] is an 66 y.o. female who presented to [facility] on 09/04/2021 with a chief complaint of shortness of breath  Assessment:  Blood cultures 1/3 bottles Klebsiella pneumoniae, no resistance Urine culture 50k K.pneumo  Name of physician (or Provider) Contacted: Dr. Lucile Shutters  Current antibiotics: ceftriaxone 2 g  Changes to prescribed antibiotics recommended: appropriate coverage at this time, no change  Results for orders placed or performed during the hospital encounter of 09/04/21  Blood Culture ID Panel (Reflexed) (Collected: 09/04/2021  7:42 PM)  Result Value Ref Range   Enterococcus faecalis NOT DETECTED NOT DETECTED   Enterococcus Faecium NOT DETECTED NOT DETECTED   Listeria monocytogenes NOT DETECTED NOT DETECTED   Staphylococcus species NOT DETECTED NOT DETECTED   Staphylococcus aureus (BCID) NOT DETECTED NOT DETECTED   Staphylococcus epidermidis NOT DETECTED NOT DETECTED   Staphylococcus lugdunensis NOT DETECTED NOT DETECTED   Streptococcus species NOT DETECTED NOT DETECTED   Streptococcus agalactiae NOT DETECTED NOT DETECTED   Streptococcus pneumoniae NOT DETECTED NOT DETECTED   Streptococcus pyogenes NOT DETECTED NOT DETECTED   A.calcoaceticus-baumannii NOT DETECTED NOT DETECTED   Bacteroides fragilis NOT DETECTED NOT DETECTED   Enterobacterales DETECTED (A) NOT DETECTED   Enterobacter cloacae complex NOT DETECTED NOT DETECTED   Escherichia coli NOT DETECTED NOT DETECTED   Klebsiella aerogenes NOT DETECTED NOT DETECTED   Klebsiella oxytoca NOT DETECTED NOT DETECTED   Klebsiella pneumoniae DETECTED (A) NOT DETECTED   Proteus species NOT DETECTED NOT DETECTED   Salmonella species NOT DETECTED NOT DETECTED   Serratia marcescens NOT DETECTED NOT DETECTED   Haemophilus influenzae NOT DETECTED NOT DETECTED   Neisseria meningitidis NOT DETECTED NOT DETECTED    Pseudomonas aeruginosa NOT DETECTED NOT DETECTED   Stenotrophomonas maltophilia NOT DETECTED NOT DETECTED   Candida albicans NOT DETECTED NOT DETECTED   Candida auris NOT DETECTED NOT DETECTED   Candida glabrata NOT DETECTED NOT DETECTED   Candida krusei NOT DETECTED NOT DETECTED   Candida parapsilosis NOT DETECTED NOT DETECTED   Candida tropicalis NOT DETECTED NOT DETECTED   Cryptococcus neoformans/gattii NOT DETECTED NOT DETECTED   CTX-M ESBL NOT DETECTED NOT DETECTED   Carbapenem resistance IMP NOT DETECTED NOT DETECTED   Carbapenem resistance KPC NOT DETECTED NOT DETECTED   Carbapenem resistance NDM NOT DETECTED NOT DETECTED   Carbapenem resist OXA 48 LIKE NOT DETECTED NOT DETECTED   Carbapenem resistance VIM NOT DETECTED NOT [place], PharmD, BCPS Clinical Pharmacist 09/06/2021 7:30 PM

## 2021-09-06 NOTE — Progress Notes (Signed)
eLink Physician-Brief Progress Note Patient Name: [name] [name] DOB: 1955/12/27 MRN: [id number]   Date of Service  09/06/2021  HPI/Events of Note  Patient asking for home dose of Remeron 15 mg po Q Hs.  eICU Interventions  Remeron 7.5 mg po Q HS PRN ordered with a hold parameter for excessive sedation.        Frederik Pear 09/06/2021, 10:47 PM

## 2021-09-07 DIAGNOSIS — E1122 Type 2 diabetes mellitus with diabetic chronic kidney disease: Secondary | ICD-10-CM

## 2021-09-07 DIAGNOSIS — I5033 Acute on chronic diastolic (congestive) heart failure: Secondary | ICD-10-CM | POA: Diagnosis not present

## 2021-09-07 DIAGNOSIS — N179 Acute kidney failure, unspecified: Secondary | ICD-10-CM | POA: Diagnosis not present

## 2021-09-07 DIAGNOSIS — F32A Depression, unspecified: Secondary | ICD-10-CM

## 2021-09-07 DIAGNOSIS — R7881 Bacteremia: Secondary | ICD-10-CM

## 2021-09-07 DIAGNOSIS — G4733 Obstructive sleep apnea (adult) (pediatric): Secondary | ICD-10-CM

## 2021-09-07 DIAGNOSIS — R532 Functional quadriplegia: Secondary | ICD-10-CM

## 2021-09-07 DIAGNOSIS — A419 Sepsis, unspecified organism: Secondary | ICD-10-CM | POA: Diagnosis not present

## 2021-09-07 DIAGNOSIS — N1831 Chronic kidney disease, stage 3a: Secondary | ICD-10-CM

## 2021-09-07 DIAGNOSIS — N3001 Acute cystitis with hematuria: Secondary | ICD-10-CM

## 2021-09-07 DIAGNOSIS — Z9989 Dependence on other enabling machines and devices: Secondary | ICD-10-CM

## 2021-09-07 DIAGNOSIS — B961 Klebsiella pneumoniae [K. pneumoniae] as the cause of diseases classified elsewhere: Secondary | ICD-10-CM

## 2021-09-07 DIAGNOSIS — N390 Urinary tract infection, site not specified: Secondary | ICD-10-CM

## 2021-09-07 DIAGNOSIS — I10 Essential (primary) hypertension: Secondary | ICD-10-CM

## 2021-09-07 DIAGNOSIS — R6520 Severe sepsis without septic shock: Secondary | ICD-10-CM

## 2021-09-07 DIAGNOSIS — J9601 Acute respiratory failure with hypoxia: Secondary | ICD-10-CM | POA: Diagnosis not present

## 2021-09-07 DIAGNOSIS — F419 Anxiety disorder, unspecified: Secondary | ICD-10-CM

## 2021-09-07 LAB — BASIC METABOLIC PANEL
Anion gap: 10 (ref 5–15)
BUN: 49 mg/dL — ABNORMAL HIGH (ref 8–23)
CO2: 23 mmol/L (ref 22–32)
Calcium: 8.8 mg/dL — ABNORMAL LOW (ref 8.9–10.3)
Chloride: 108 mmol/L (ref 98–111)
Creatinine, Ser: 2.7 mg/dL — ABNORMAL HIGH (ref 0.44–1.00)
GFR, Estimated: 19 mL/min — ABNORMAL LOW (ref >60)
Glucose, Bld: 145 mg/dL — ABNORMAL HIGH (ref 70–99)
Potassium: 4.4 mmol/L (ref 3.5–5.1)
Sodium: 141 mmol/L (ref 135–145)

## 2021-09-07 LAB — GLUCOSE, CAPILLARY
Glucose-Capillary: 130 mg/dL — ABNORMAL HIGH (ref 70–99)
Glucose-Capillary: 146 mg/dL — ABNORMAL HIGH (ref 70–99)
Glucose-Capillary: 169 mg/dL — ABNORMAL HIGH (ref 70–99)
Glucose-Capillary: 173 mg/dL — ABNORMAL HIGH (ref 70–99)
Glucose-Capillary: 174 mg/dL — ABNORMAL HIGH (ref 70–99)
Glucose-Capillary: 230 mg/dL — ABNORMAL HIGH (ref 70–99)

## 2021-09-07 LAB — URINE CULTURE: Culture: 50000 — AB

## 2021-09-07 LAB — CBC
HCT: 39.2 % (ref 36.0–46.0)
Hemoglobin: 11.6 g/dL — ABNORMAL LOW (ref 12.0–15.0)
MCH: 24.9 pg — ABNORMAL LOW (ref 26.0–34.0)
MCHC: 29.6 g/dL — ABNORMAL LOW (ref 30.0–36.0)
MCV: 84.3 fL (ref 80.0–100.0)
Platelets: 323 10*3/uL (ref 150–400)
RBC: 4.65 MIL/uL (ref 3.87–5.11)
RDW: 14.9 % (ref 11.5–15.5)
WBC: 8.9 10*3/uL (ref 4.0–10.5)
nRBC: 0.2 % (ref 0.0–0.2)

## 2021-09-07 LAB — HEMOGLOBIN A1C
Hgb A1c MFr Bld: 15.1 % — ABNORMAL HIGH (ref 4.8–5.6)
Mean Plasma Glucose: 387 mg/dL

## 2021-09-07 MED ORDER — PREGABALIN 50 MG PO CAPS
50.0000 mg | ORAL_CAPSULE | Freq: Two times a day (BID) | ORAL | Status: DC
  Administered 2021-09-07 – 2021-09-09 (×4): 50 mg via ORAL
  Filled 2021-09-07 (×4): qty 1

## 2021-09-07 MED ORDER — SODIUM CHLORIDE 0.9 % IV SOLN: 250.0000 mL | INTRAVENOUS | Status: DC

## 2021-09-07 MED ORDER — ATORVASTATIN CALCIUM 40 MG PO TABS
40.0000 mg | ORAL_TABLET | Freq: Every day | ORAL | Status: DC
  Administered 2021-09-07 – 2021-09-09 (×3): 40 mg via ORAL
  Filled 2021-09-07 (×3): qty 1

## 2021-09-07 MED ORDER — NOREPINEPHRINE 4 MG/250ML-% IV SOLN: 2.0000 ug/min | INTRAVENOUS | Status: DC

## 2021-09-07 MED ORDER — FUROSEMIDE 10 MG/ML IJ SOLN
60.0000 mg | Freq: Every day | INTRAMUSCULAR | Status: DC
Start: 2021-09-07 — End: 2021-09-09
  Administered 2021-09-07 – 2021-09-09 (×3): 60 mg via INTRAVENOUS
  Filled 2021-09-07 (×3): qty 6

## 2021-09-07 MED ORDER — ESCITALOPRAM OXALATE 10 MG PO TABS
10.0000 mg | ORAL_TABLET | Freq: Every morning | ORAL | Status: DC
  Administered 2021-09-08 – 2021-09-09 (×2): 10 mg via ORAL
  Filled 2021-09-07 (×2): qty 1

## 2021-09-07 MED ORDER — PANTOPRAZOLE SODIUM 40 MG PO TBEC
40.0000 mg | DELAYED_RELEASE_TABLET | Freq: Every day | ORAL | Status: DC
  Administered 2021-09-08 – 2021-09-09 (×2): 40 mg via ORAL
  Filled 2021-09-07 (×2): qty 1

## 2021-09-07 NOTE — Progress Notes (Signed)
Foley catheter removed per MD order.  Angie Fava, RN

## 2021-09-07 NOTE — Progress Notes (Signed)
09/07/2021 Patient noted to have decreased SBP in 70s and 80s including after rechecks and cuff repositioning. Asleep on BiPAP at the time, previously awake one hour earlier during lab draw. Notified e-Link RN Ronda Fairly of the above, and subsequently received vasopressor orders per Dr. Lucile Shutters. BP at 0500 improved to 105/52; spoke with e-Link RN Ronda Fairly with this update, instructions to hold on vasopressor at this time per order parameters. IV team nurse notified of above; request to contact IV team if pressors are to be started in the future. Cindy S. Brigitte Pulse BSN, RN, CCRP 09/07/2021 5:45 AM

## 2021-09-07 NOTE — Progress Notes (Signed)
Pt transferred to the floor. Report  given to 4W RN. Pt stable at time of transfer.

## 2021-09-07 NOTE — Progress Notes (Addendum)
PROGRESS NOTE  [name] [name] HTD:[id number] DOB: 1955/05/11   PCP: [facility]  Patient is from: Home.  Lives by self.  She has 4 aides that alternate by shift.  Followed by [facility] outpatient.  DOA: 09/04/2021 LOS: 3  Chief complaints Chief Complaint  Patient presents with   Shortness of Breath     Brief Narrative / Interim history: 66 year old F with PMH of OSA/OHS on CPAP, morbid obesity, DM-2, CKD-3B, duodenal ulcer and nonambulatory status with Harrel Lemon lift dependence brought to ED by EMS due to shortness of breath and hypoxia to 60%, and admitted for acute on chronic respiratory failure with hypoxia and hypercapnia, acute diastolic CHF, AKI, hyperkalemia and UTI.    Apparently, patient was in severe sepsis with tachypnea, tachycardia, leukocytosis, AKI and respiratory failure in the setting of urinary tract infection and bacteremia.  She was admitted to ICU.  Started on IV antibiotics, BiPAP and IV fluid.  Respiratory failure resolved.  AKI improved.  She was started on diuretics due to fluid overload/acute CHF from fluid resuscitation.  Urine culture and blood culture grew Klebsiella pneumonia.  Antibiotic de-escalated to IV ceftriaxone.  She was transferred to [facility] hospitalist service on 09/07/2021.    Subjective: Seen and examined earlier this morning.  No major events overnight of this morning.  No complaints.  Feels better.  She denies chest pain, shortness of breath or GI symptoms.  Objective: Vitals:   09/07/21 0750 09/07/21 0800 09/07/21 0900 09/07/21 1142  BP:  (!) 121/44 107/85 (!) 143/78  Pulse:  76 73 66  Resp:  (!) 26 (!) 23 20  Temp: 97.9 F (36.6 C)   98.4 F (36.9 C)  TempSrc: Oral   Oral  SpO2:  100% 94% 96%  Weight:        Examination:  GENERAL: No apparent distress.  Nontoxic. HEENT: MMM.  Vision and hearing grossly intact.  NECK: Supple.  Difficult to assess JVD due to body habitus RESP:  No IWOB.  Fair aeration bilaterally but limited  exam due to body habitus. CVS:  RRR. Heart sounds normal.  ABD/GI/GU: BS+. Abd soft, NTND.  Indwelling Foley in place. MSK/EXT: Barely moves lower extremities. SKIN: no apparent skin lesion or wound NEURO: Awake, alert and oriented appropriately.  No apparent focal neuro deficit other than BLE weakness PSYCH: Calm. Normal affect.   Procedures:  None  Microbiology summarized: MRSA PCR screen positive. COVID-19 PCR screen negative. Blood culture with Klebsiella pneumonia Urine culture with Klebsiella pneumonia  Assessment and plan: Principal Problem:   Acute respiratory failure with hypoxia and hypercapnia (HCC) Active Problems:   Type 2 diabetes mellitus with stage 3 chronic kidney disease (HCC)   Anxiety and depression   Essential hypertension   Acute on chronic diastolic CHF (congestive heart failure) (HCC)   Acute renal failure superimposed on stage 3b chronic kidney disease (HCC)   OSA on CPAP   Functional quadriplegia (HCC)   Class 3 obesity (HCC)   Severe sepsis (HCC)   Bacteremia due to Klebsiella pneumoniae   Urinary tract infection    Acute hypoxic and hypercarbic respiratory failure OSA/OHS on home BiPAP -Minimum oxygen to keep saturation above 88% -Strict BiPAP when she naps and at night -Minimize or avoid sedating medications.  Severe sepsis due to Klebsiella pneumonia bacteremia and UTI: POA.  Sepsis physiology resolved.  Blood and urine culture with Klebsiella pneumonia. -Continue IV ceftriaxone   Acute on chronic diastolic CHF: TTE with LVEF of 60 to 65%, G1  DD, grossly enlarged RF but poorly visualized.  On p.o. Lasix 40 mg MWF at home.  Difficult to assess fluid status.  Exacerbation likely due to fluid resuscitation in the setting of severe sepsis.  Diuresed with IV Lasix while in ICU.  She had about 2.5 L UOP/24 hours.  Net -1.1 L.  Creatinine improved. -Give IV Lasix 60 mg x 1 and reassess in the morning -Monitor intake and output, daily weights, renal  functions and electrolytes.  Elevated troponin: Likely demand ischemia in the setting of the above.  TTE reassuring.  EKG without significant finding. -Management as above   AKI/azotemia superimposed on CKD-3B: Multifactorial including hypotension and sepsis.  No hydro.  Improving. Recent Labs    02/01/21 1334 02/02/21 0310 02/03/21 0536 04/21/21 1532 06/13/21 1546 09/04/21 1858 09/05/21 0308 09/06/21 0307 09/06/21 1342 09/07/21 0321  BUN 64* 62* 59* 17 36* 48* 51* 53* 51* 49*  CREATININE 2.33* 2.09* 2.16* 0.97 1.35* 3.57* 3.80* 3.39* 3.11* 2.70*  -Continue monitoring   Uncontrolled IDDM-2 with hyperglycemia and CKD-3B: A1c 15.1%. Recent Labs  Lab 09/06/21 2008 09/06/21 2339 09/07/21 0320 09/07/21 0748 09/07/21 1205  GLUCAP 224* 169* 146* 130* 174*  -Continue current insulin regimen -Increase Lipitor to 20 mg daily  Anxiety and depression: Stable -Continue home medications except Atarax.  History of duodenal ulcer -Continue PPI  Hyperkalemia: Resolved.  Morbid obesity/functional quadriplegia: Hoyer lift dependent at baseline.  She has 4 aides alternating Body mass index is 53.31 kg/m. -PT/OT eval           DVT prophylaxis:  heparin injection 5,000 Units Start: 09/04/21 2345  Code Status: Full code Family Communication: None at bedside Level of care: Progressive Status is: Inpatient Remains inpatient appropriate because: Severe sepsis, AKI and acute CHF   Final disposition: TBD Consultants:  Pulmonology admitted patient.  Sch Meds:  Scheduled Meds:  atorvastatin  40 mg Oral Daily   Chlorhexidine Gluconate Cloth  6 each Topical Q0600   Chlorhexidine Gluconate Cloth  6 each Topical Q0600   [START ON 09/08/2021] escitalopram  10 mg Oral q morning   furosemide  60 mg Intravenous Daily   heparin  5,000 Units Subcutaneous Q8H   insulin aspart  0-20 Units Subcutaneous Q4H   insulin glargine-yfgn  25 Units Subcutaneous QHS   mupirocin ointment  1  Application Nasal BID   [START ON 09/08/2021] pantoprazole  40 mg Oral QAC breakfast   sodium zirconium cyclosilicate  10 g Oral Once   Continuous Infusions:  sodium chloride     cefTRIAXone (ROCEPHIN)  IV Stopped (09/07/21 1054)   PRN Meds:.docusate sodium, mirtazapine, ondansetron (ZOFRAN) IV, polyethylene glycol  Antimicrobials: Anti-infectives (From admission, onward)    Start     Dose/Rate Route Frequency Ordered Stop   09/06/21 1000  cefTRIAXone (ROCEPHIN) 2 g in sodium chloride 0.9 % 100 mL IVPB       Note to Pharmacy: Tolerated cefepime   2 g 200 mL/hr over 30 Minutes Intravenous Every 24 hours 09/06/21 0834     09/05/21 1000  ceFEPIme (MAXIPIME) 2 g in sodium chloride 0.9 % 100 mL IVPB  Status:  Discontinued        2 g 200 mL/hr over 30 Minutes Intravenous Every 12 hours 09/04/21 2343 09/06/21 0834   09/04/21 2215  ceFEPIme (MAXIPIME) 2 g in sodium chloride 0.9 % 100 mL IVPB        2 g 200 mL/hr over 30 Minutes Intravenous  Once 09/04/21 2206 09/04/21 2301  I have personally reviewed the following labs and images: CBC: Recent Labs  Lab 09/04/21 1858 09/05/21 0308 09/06/21 0307 09/07/21 0321  WBC 14.7* 13.0* 11.7* 8.9  NEUTROABS 11.6*  --   --   --   HGB 12.4 11.7* 10.1* 11.6*  HCT 41.0 39.1 35.0* 39.2  MCV 83.2 86.3 88.4 84.3  PLT 283 267 252 323   BMP &GFR Recent Labs  Lab 09/04/21 1858 09/05/21 0308 09/06/21 0307 09/06/21 1342 09/07/21 0321  NA 137 137 141 139 141  K 5.9* 4.9 5.3* 4.7 4.4  CL 102 107 110 109 108  CO2 22 20* 18* 21* 23  GLUCOSE 391* 332* 132* 223* 145*  BUN 48* 51* 53* 51* 49*  CREATININE 3.57* 3.80* 3.39* 3.11* 2.70*  CALCIUM 8.7* 8.1* 8.4* 8.7* 8.8*   Estimated Creatinine Clearance: 26.1 mL/min (A) (by C-G formula based on SCr of 2.7 mg/dL (H)). Liver & Pancreas: Recent Labs  Lab 09/04/21 1858  AST 13*  ALT 13  ALKPHOS 139*  BILITOT 0.8  PROT 8.0  ALBUMIN 2.8*   No results for input(s): "LIPASE", "AMYLASE" in  the last 168 hours. No results for input(s): "AMMONIA" in the last 168 hours. Diabetic: Recent Labs    09/05/21 0308  HGBA1C 15.1*   Recent Labs  Lab 09/06/21 2008 09/06/21 2339 09/07/21 0320 09/07/21 0748 09/07/21 1205  GLUCAP 224* 169* 146* 130* 174*   Cardiac Enzymes: No results for input(s): "CKTOTAL", "CKMB", "CKMBINDEX", "TROPONINI" in the last 168 hours. No results for input(s): "PROBNP" in the last 8760 hours. Coagulation Profile: No results for input(s): "INR", "PROTIME" in the last 168 hours. Thyroid Function Tests: No results for input(s): "TSH", "T4TOTAL", "FREET4", "T3FREE", "THYROIDAB" in the last 72 hours. Lipid Profile: No results for input(s): "CHOL", "HDL", "LDLCALC", "TRIG", "CHOLHDL", "LDLDIRECT" in the last 72 hours. Anemia Panel: No results for input(s): "VITAMINB12", "FOLATE", "FERRITIN", "TIBC", "IRON", "RETICCTPCT" in the last 72 hours. Urine analysis:    Component Value Date/Time   COLORURINE YELLOW 09/04/2021 2346   APPEARANCEUR TURBID (A) 09/04/2021 2346   LABSPEC 1.013 09/04/2021 2346   PHURINE 5.0 09/04/2021 2346   GLUCOSEU NEGATIVE 09/04/2021 2346   HGBUR SMALL (A) 09/04/2021 2346   BILIRUBINUR NEGATIVE 09/04/2021 2346   KETONESUR NEGATIVE 09/04/2021 2346   PROTEINUR >=300 (A) 09/04/2021 2346   UROBILINOGEN 0.2 06/28/2014 1500   NITRITE NEGATIVE 09/04/2021 2346   LEUKOCYTESUR MODERATE (A) 09/04/2021 2346   Sepsis Labs: Invalid input(s): "PROCALCITONIN", "LACTICIDVEN"  Microbiology: Recent Results (from the past 240 hour(s))  Culture, blood (routine x 2)     Status: None (Preliminary result)   Collection Time: 09/04/21  7:40 PM   Specimen: BLOOD  Result Value Ref Range Status   Specimen Description   Final    BLOOD BLOOD LEFT ARM Performed at [facility] [address], [place], [place] [address]    Special Requests   Final    BOTTLES DRAWN AEROBIC ONLY Blood Culture results may not be optimal due to an  inadequate volume of blood received in culture bottles Performed at [facility] [address], [place], [place] [address]    Culture   Final    NO GROWTH 2 DAYS Performed at [facility] [address], [place], [place] [address]    Report Status PENDING  Incomplete  Culture, blood (routine x 2)     Status: Abnormal (Preliminary result)   Collection Time: 09/04/21  7:42 PM   Specimen: BLOOD  Result Value Ref Range Status   Specimen Description   Final    BLOOD BLOOD RIGHT FOREARM Performed at [facility] [address] [place], [place] [address]    Special Requests   Final    BOTTLES DRAWN AEROBIC AND ANAEROBIC Blood Culture adequate volume Performed at [facility] [address], [place], [place] [address]    Culture  Setup Time   Final    GRAM NEGATIVE RODS ANAEROBIC BOTTLE ONLY CRITICAL RESULT CALLED TO, READ BACK BY AND VERIFIED WITH: PHARMD Beckey Rutter [id number] '@1853'$  FH    Culture (A)  Final    KLEBSIELLA PNEUMONIAE SUSCEPTIBILITIES TO FOLLOW Performed at [facility], [place] [address], [place], [place] [address]    Report Status PENDING  Incomplete  Blood Culture ID Panel (Reflexed)     Status: Abnormal   Collection Time: 09/04/21  7:42 PM  Result Value Ref Range Status   Enterococcus faecalis NOT DETECTED NOT DETECTED Final   Enterococcus Faecium NOT DETECTED NOT DETECTED Final   Listeria monocytogenes NOT DETECTED NOT DETECTED Final   Staphylococcus species NOT DETECTED NOT DETECTED Final   Staphylococcus aureus (BCID) NOT DETECTED NOT DETECTED Final   Staphylococcus epidermidis NOT DETECTED NOT DETECTED Final   Staphylococcus lugdunensis NOT DETECTED NOT DETECTED Final   Streptococcus species NOT DETECTED NOT DETECTED Final   Streptococcus agalactiae NOT DETECTED NOT DETECTED Final   Streptococcus pneumoniae NOT DETECTED NOT DETECTED Final   Streptococcus pyogenes NOT DETECTED NOT DETECTED  Final   A.calcoaceticus-baumannii NOT DETECTED NOT DETECTED Final   Bacteroides fragilis NOT DETECTED NOT DETECTED Final   Enterobacterales DETECTED (A) NOT DETECTED Final    Comment: Enterobacterales represent a large order of gram negative bacteria, not a single organism. CRITICAL RESULT CALLED TO, READ BACK BY AND VERIFIED WITH: PHARMD ABuford Dresser [id number] '@1853'$  FH    Enterobacter cloacae complex NOT DETECTED NOT DETECTED Final   Escherichia coli NOT DETECTED NOT DETECTED Final   Klebsiella aerogenes NOT DETECTED NOT DETECTED Final   Klebsiella oxytoca NOT DETECTED NOT DETECTED Final   Klebsiella pneumoniae DETECTED (A) NOT DETECTED Final    Comment: CRITICAL RESULT CALLED TO, READ BACK BY AND VERIFIED WITH: PHARMD ABuford Dresser [id number] '@1853'$  FH    Proteus species NOT DETECTED NOT DETECTED Final   Salmonella species NOT DETECTED NOT DETECTED Final   Serratia marcescens NOT DETECTED NOT DETECTED Final   Haemophilus influenzae NOT DETECTED NOT DETECTED Final   Neisseria meningitidis NOT DETECTED NOT DETECTED Final   Pseudomonas aeruginosa NOT DETECTED NOT DETECTED Final   Stenotrophomonas maltophilia NOT DETECTED NOT DETECTED Final   Candida albicans NOT DETECTED NOT DETECTED Final   Candida auris NOT DETECTED NOT DETECTED Final   Candida glabrata NOT DETECTED NOT DETECTED Final   Candida krusei NOT DETECTED NOT DETECTED Final   Candida parapsilosis NOT DETECTED NOT DETECTED Final   Candida tropicalis NOT DETECTED NOT DETECTED Final   Cryptococcus neoformans/gattii NOT DETECTED NOT DETECTED Final   CTX-M ESBL NOT DETECTED NOT DETECTED Final   Carbapenem resistance IMP NOT DETECTED NOT DETECTED Final   Carbapenem resistance KPC NOT DETECTED NOT DETECTED Final   Carbapenem resistance NDM NOT DETECTED NOT DETECTED Final   Carbapenem resist OXA 48 LIKE NOT DETECTED NOT DETECTED Final   Carbapenem resistance VIM NOT DETECTED NOT DETECTED Final    Comment: Performed at [facility]  Lab, [address], [place], [place] [address]  Urine Culture  Status: Abnormal   Collection Time: 09/04/21 11:46 PM   Specimen: Urine, Clean Catch  Result Value Ref Range Status   Specimen Description   Final    URINE, CLEAN CATCH Performed at [facility], [place] [address], [place], [place] [address]    Special Requests   Final    NONE Performed at [facility], [place] [address] [place], [place] [address]    Culture 50,000 COLONIES/mL KLEBSIELLA PNEUMONIAE (A)  Final   Report Status 09/07/2021 FINAL  Final   Organism ID, Bacteria KLEBSIELLA PNEUMONIAE (A)  Final      Susceptibility   Klebsiella pneumoniae - MIC*    AMPICILLIN >=32 RESISTANT Resistant     CEFAZOLIN <=4 SENSITIVE Sensitive     CEFEPIME <=0.12 SENSITIVE Sensitive     CEFTRIAXONE <=0.25 SENSITIVE Sensitive     CIPROFLOXACIN <=0.25 SENSITIVE Sensitive     GENTAMICIN <=1 SENSITIVE Sensitive     IMIPENEM 0.5 SENSITIVE Sensitive     NITROFURANTOIN 128 RESISTANT Resistant     TRIMETH/SULFA <=20 SENSITIVE Sensitive     AMPICILLIN/SULBACTAM 8 SENSITIVE Sensitive     PIP/TAZO <=4 SENSITIVE Sensitive     * 50,000 COLONIES/mL KLEBSIELLA PNEUMONIAE  SARS Coronavirus 2 by RT PCR (hospital order, performed in [place] hospital lab) *cepheid single result test* Anterior Nasal Swab     Status: None   Collection Time: 09/05/21  1:32 AM   Specimen: Anterior Nasal Swab  Result Value Ref Range Status   SARS Coronavirus 2 by RT PCR NEGATIVE NEGATIVE Final    Comment: (NOTE) SARS-CoV-2 target nucleic acids are NOT DETECTED.  The SARS-CoV-2 RNA is generally detectable in upper and lower respiratory specimens during the acute phase of infection. The lowest concentration of SARS-CoV-2 viral copies this assay can detect is 250 copies / mL. A negative result does not preclude SARS-CoV-2 infection and should not be used as the sole basis for treatment or other patient management decisions.  A  negative result may occur with improper specimen collection / handling, submission of specimen other than nasopharyngeal swab, presence of viral mutation(s) within the areas targeted by this assay, and inadequate number of viral copies (<250 copies / mL). A negative result must be combined with clinical observations, patient history, and epidemiological information.  Fact Sheet for Patients:   https://www.patel.info/  Fact Sheet for Healthcare Providers: https://hall.com/  This test is not yet approved or  cleared by the [place] FDA and has been authorized for detection and/or diagnosis of SARS-CoV-2 by FDA under an Emergency Use Authorization (EUA).  This EUA will remain in effect (meaning this test can be used) for the duration of the COVID-19 declaration under Section 564(b)(1) of the Act, 21 U.S.C. section 360bbb-3(b)(1), unless the authorization is terminated or revoked sooner.  Performed at [facility], [place] [address], [place], [place] [address]   MRSA Next Gen by PCR, Nasal     Status: Abnormal   Collection Time: 09/05/21  2:02 AM   Specimen: Nasal Mucosa; Nasal Swab  Result Value Ref Range Status   MRSA by PCR Next Gen DETECTED (A) NOT DETECTED Final    Comment: (NOTE) The GeneXpert MRSA Assay (FDA approved for NASAL specimens only), is one component of a comprehensive MRSA colonization surveillance program. It is not intended to diagnose MRSA infection nor to guide or monitor treatment for MRSA infections. Test performance is not FDA approved in patients less than 51 years old. Performed at [facility], [place] [address], [place], [place] [address]     Radiology Studies: No results found.    Brannon Levene T. [place]  If 7PM-7AM, please contact night-coverage [email or web address] 09/07/2021, 3:07 PM

## 2021-09-07 NOTE — Progress Notes (Signed)
PT Cancellation Note  Patient Details Name: [name] [name] MRN: [id number] DOB: 03/31/1955   Cancelled Treatment:    Reason Eval/Treat Not Completed: Patient not medically ready, noted low BP's being managed. Will follow.  [place] Office [phone] Weekend [phone]    Claretha Cooper 09/07/2021, 7:40 AM

## 2021-09-07 NOTE — Progress Notes (Signed)
eLink Physician-Brief Progress Note Patient Name: [name] [name] DOB: 1955/11/30 MRN: [id number]   Date of Service  09/07/2021  HPI/Events of Note  Patient with soft blood pressures.  eICU Interventions  Peripheral Norepinephrine gtt ordered.        Kerry Kass Sayde Lish 09/07/2021, 4:51 AM

## 2021-09-08 DIAGNOSIS — I5033 Acute on chronic diastolic (congestive) heart failure: Secondary | ICD-10-CM | POA: Diagnosis not present

## 2021-09-08 DIAGNOSIS — N179 Acute kidney failure, unspecified: Secondary | ICD-10-CM | POA: Diagnosis not present

## 2021-09-08 DIAGNOSIS — A419 Sepsis, unspecified organism: Secondary | ICD-10-CM | POA: Diagnosis not present

## 2021-09-08 DIAGNOSIS — J9601 Acute respiratory failure with hypoxia: Secondary | ICD-10-CM | POA: Diagnosis not present

## 2021-09-08 LAB — CULTURE, BLOOD (ROUTINE X 2): Special Requests: ADEQUATE

## 2021-09-08 LAB — CBC
HCT: 36.4 % (ref 36.0–46.0)
Hemoglobin: 11.1 g/dL — ABNORMAL LOW (ref 12.0–15.0)
MCH: 25.2 pg — ABNORMAL LOW (ref 26.0–34.0)
MCHC: 30.5 g/dL (ref 30.0–36.0)
MCV: 82.7 fL (ref 80.0–100.0)
Platelets: 364 10*3/uL (ref 150–400)
RBC: 4.4 MIL/uL (ref 3.87–5.11)
RDW: 14.8 % (ref 11.5–15.5)
WBC: 10.6 10*3/uL — ABNORMAL HIGH (ref 4.0–10.5)
nRBC: 0.3 % — ABNORMAL HIGH (ref 0.0–0.2)

## 2021-09-08 LAB — RENAL FUNCTION PANEL
Albumin: 2.6 g/dL — ABNORMAL LOW (ref 3.5–5.0)
Anion gap: 12 (ref 5–15)
BUN: 40 mg/dL — ABNORMAL HIGH (ref 8–23)
CO2: 24 mmol/L (ref 22–32)
Calcium: 8.9 mg/dL (ref 8.9–10.3)
Chloride: 107 mmol/L (ref 98–111)
Creatinine, Ser: 2.06 mg/dL — ABNORMAL HIGH (ref 0.44–1.00)
GFR, Estimated: 26 mL/min — ABNORMAL LOW (ref >60)
Glucose, Bld: 136 mg/dL — ABNORMAL HIGH (ref 70–99)
Phosphorus: 3.2 mg/dL (ref 2.5–4.6)
Potassium: 3.9 mmol/L (ref 3.5–5.1)
Sodium: 143 mmol/L (ref 135–145)

## 2021-09-08 LAB — GLUCOSE, CAPILLARY
Glucose-Capillary: 106 mg/dL — ABNORMAL HIGH (ref 70–99)
Glucose-Capillary: 142 mg/dL — ABNORMAL HIGH (ref 70–99)
Glucose-Capillary: 141 mg/dL — ABNORMAL HIGH (ref 70–99)
Glucose-Capillary: 176 mg/dL — ABNORMAL HIGH (ref 70–99)
Glucose-Capillary: 149 mg/dL — ABNORMAL HIGH (ref 70–99)

## 2021-09-08 LAB — TSH: TSH: 2.404 u[IU]/mL (ref 0.350–4.500)

## 2021-09-08 LAB — MAGNESIUM: Magnesium: 1.2 mg/dL — ABNORMAL LOW (ref 1.7–2.4)

## 2021-09-08 LAB — BRAIN NATRIURETIC PEPTIDE: B Natriuretic Peptide: 93.8 pg/mL (ref 0.0–100.0)

## 2021-09-08 LAB — CK: Total CK: 42 U/L (ref 38–234)

## 2021-09-08 MED ORDER — CEFAZOLIN SODIUM-DEXTROSE 2-4 GM/100ML-% IV SOLN
2.0000 g | Freq: Three times a day (TID) | INTRAVENOUS | Status: DC
  Administered 2021-09-09: 2 g via INTRAVENOUS
  Filled 2021-09-08 (×2): qty 100

## 2021-09-08 MED ORDER — MAGNESIUM SULFATE 4 GM/100ML IV SOLN
4.0000 g | Freq: Once | INTRAVENOUS | Status: AC
  Administered 2021-09-08: 4 g via INTRAVENOUS
  Filled 2021-09-08: qty 100

## 2021-09-08 NOTE — Progress Notes (Signed)
Found patient off BiPAP, on room during routine rounds. Patient declines going back on BiPAP at this time and states she's going to "stay awake". Placed on 2L nasal O2. RT will continue to follow.

## 2021-09-08 NOTE — Progress Notes (Signed)
PROGRESS NOTE  [name] [name] VXB:[id number] DOB: 02-May-1955   PCP: [facility]  Patient is from: Home.  Lives by self.  She has 4 aides that alternate by shift.  Followed by [facility]T outpatient.  DOA: 09/04/2021 LOS: 4  Chief complaints Chief Complaint  Patient presents with   Shortness of Breath     Brief Narrative / Interim history: 66 year old F with PMH of OSA/OHS on CPAP, morbid obesity, DM-2, CKD-3B, duodenal ulcer and nonambulatory status with Harrel Lemon lift dependence brought to ED by EMS due to shortness of breath and hypoxia to 60%, and admitted for acute on chronic respiratory failure with hypoxia and hypercapnia, acute diastolic CHF, AKI, hyperkalemia and UTI.    Apparently, patient was in severe sepsis with tachypnea, tachycardia, leukocytosis, AKI and respiratory failure in the setting of urinary tract infection and bacteremia.  She was admitted to ICU.  Started on IV antibiotics, BiPAP and IV fluid.  Respiratory failure resolved.  AKI improved.  She was started on diuretics due to fluid overload/acute CHF from fluid resuscitation.  Urine culture and blood culture grew Klebsiella pneumonia.  Antibiotic de-escalated to IV ceftriaxone.  She was transferred to [facility] hospitalist service on 09/07/2021.    Subjective: Seen and examined earlier this morning.  No major events overnight of this morning.  She wore BiPAP for part of last night.  She was sleepy this morning but wakes to voice.  She reports pain in both knees.  We have discussed about the importance of using BiPAP when she falls asleep or at night.  Objective: Vitals:   09/08/21 0514 09/08/21 0700 09/08/21 0800 09/08/21 1323  BP: 120/73   (!) 134/114  Pulse: 78   66  Resp: 19 (!) 26 (!) 22   Temp: 98.9 F (37.2 C)     TempSrc: Oral     SpO2: 94%  94% 100%  Weight:      Height:        Examination:  GENERAL: No apparent distress.  Nontoxic. HEENT: MMM.  Vision and hearing grossly intact.  NECK: Supple.   Difficult to assess JVD due to body habitus. RESP:  No IWOB.  Fair aeration bilaterally but limited exam due to body habitus CVS:  RRR. Heart sounds normal.  ABD/GI/GU: BS+. Abd soft, NTND.  MSK/EXT:  Moves extremities. No apparent deformity. No edema.  No swelling or tenderness in both knees. SKIN: no apparent skin lesion or wound NEURO: Awake and alert. Oriented appropriately.  No apparent focal neuro deficit. PSYCH: Calm. Normal affect.   Procedures:  None  Microbiology summarized: MRSA PCR screen positive. COVID-19 PCR screen negative. Blood culture with Klebsiella pneumonia Urine culture with Klebsiella pneumonia  Assessment and plan: Principal Problem:   Acute respiratory failure with hypoxia and hypercapnia (HCC) Active Problems:   Type 2 diabetes mellitus with stage 3 chronic kidney disease (HCC)   Anxiety and depression   Essential hypertension   Acute on chronic diastolic CHF (congestive heart failure) (HCC)   Acute renal failure superimposed on stage 3b chronic kidney disease (HCC)   OSA on CPAP   Functional quadriplegia (HCC)   Class 3 obesity (HCC)   Severe sepsis (HCC)   Bacteremia due to Klebsiella pneumoniae   Urinary tract infection    Acute hypoxic and hypercarbic respiratory failure OSA/OHS on home BiPAP -Minimum oxygen to keep saturation above 88% -Strict BiPAP when she naps and at night -Minimize or avoid sedating medications.  Severe sepsis due to Klebsiella pneumonia bacteremia and  UTI: POA.  Sepsis physiology resolved.  Blood and urine culture with Klebsiella pneumonia. -IV CTX>> IV Ancef for a total of 7 days   Acute on chronic diastolic CHF: TTE with LVEF of 60 to 65%, G1 DD, grossly enlarged RF but poorly visualized.  On p.o. Lasix 40 mg MWF at home.  Difficult to assess fluid status.  Exacerbation likely due to fluid resuscitation in the setting of severe sepsis.  Diuresing with IV Lasix.  Net -2 L.  Creatinine improved. -Continue IV Lasix 60  mg daily -Monitor intake and output, daily weights, renal functions and electrolytes.  Elevated troponin: Likely demand ischemia in the setting of the above.  TTE reassuring.  EKG without significant finding. -Management as above   AKI/azotemia superimposed on CKD-3B: Multifactorial including hypotension and sepsis.  No hydro.  Improving. Recent Labs    02/02/21 0310 02/03/21 0536 04/21/21 1532 06/13/21 1546 09/04/21 1858 09/05/21 0308 09/06/21 0307 09/06/21 1342 09/07/21 0321 09/08/21 0419  BUN 62* 59* 17 36* 48* 51* 53* 51* 49* 40*  CREATININE 2.09* 2.16* 0.97 1.35* 3.57* 3.80* 3.39* 3.11* 2.70* 2.06*  -Continue monitoring   Uncontrolled IDDM-2 with hyperglycemia and CKD-3B: A1c 15.1%. Recent Labs  Lab 09/07/21 1943 09/07/21 2339 09/08/21 0435 09/08/21 0724 09/08/21 1148  GLUCAP 173* 169* 106* 142* 141*  -Continue current insulin regimen -Continue Lipitor  Anxiety and depression: Stable -Continue home medications except Atarax.  History of duodenal ulcer -Continue PPI  Hyperkalemia/hypomagnesemia: Mg 1.2. -IV magnesium sulfate 4 g x 1.  Morbid obesity/functional quadriplegia: Hoyer lift dependent at baseline.  She has 4 aides alternating Body mass index is 52.18 kg/m. -PT/OT eval           DVT prophylaxis:  heparin injection 5,000 Units Start: 09/04/21 2345  Code Status: Full code Family Communication: None at bedside Level of care: Progressive Status is: Inpatient Remains inpatient appropriate because: Severe sepsis, AKI and acute CHF   Final disposition: Likely home once medically stable Consultants:  Pulmonology admitted patient.  Sch Meds:  Scheduled Meds:  atorvastatin  40 mg Oral Daily   Chlorhexidine Gluconate Cloth  6 each Topical Q0600   Chlorhexidine Gluconate Cloth  6 each Topical Q0600   escitalopram  10 mg Oral q morning   furosemide  60 mg Intravenous Daily   heparin  5,000 Units Subcutaneous Q8H   insulin aspart  0-20 Units  Subcutaneous Q4H   insulin glargine-yfgn  25 Units Subcutaneous QHS   mupirocin ointment  1 Application Nasal BID   pantoprazole  40 mg Oral QAC breakfast   pregabalin  50 mg Oral BID   Continuous Infusions:  sodium chloride     [START ON 09/09/2021]  ceFAZolin (ANCEF) IV     PRN Meds:.docusate sodium, mirtazapine, ondansetron (ZOFRAN) IV, polyethylene glycol  Antimicrobials: Anti-infectives (From admission, onward)    Start     Dose/Rate Route Frequency Ordered Stop   09/09/21 0600  ceFAZolin (ANCEF) IVPB 2g/100 mL premix        2 g 200 mL/hr over 30 Minutes Intravenous Every 8 hours 09/08/21 1407     09/06/21 1000  cefTRIAXone (ROCEPHIN) 2 g in sodium chloride 0.9 % 100 mL IVPB  Status:  Discontinued       Note to Pharmacy: Tolerated cefepime   2 g 200 mL/hr over 30 Minutes Intravenous Every 24 hours 09/06/21 0834 09/08/21 1407   09/05/21 1000  ceFEPIme (MAXIPIME) 2 g in sodium chloride 0.9 % 100 mL IVPB  Status:  Discontinued  2 g 200 mL/hr over 30 Minutes Intravenous Every 12 hours 09/04/21 2343 09/06/21 0834   09/04/21 2215  ceFEPIme (MAXIPIME) 2 g in sodium chloride 0.9 % 100 mL IVPB        2 g 200 mL/hr over 30 Minutes Intravenous  Once 09/04/21 2206 09/04/21 2301        I have personally reviewed the following labs and images: CBC: Recent Labs  Lab 09/04/21 1858 09/05/21 0308 09/06/21 0307 09/07/21 0321 09/08/21 0419  WBC 14.7* 13.0* 11.7* 8.9 10.6*  NEUTROABS 11.6*  --   --   --   --   HGB 12.4 11.7* 10.1* 11.6* 11.1*  HCT 41.0 39.1 35.0* 39.2 36.4  MCV 83.2 86.3 88.4 84.3 82.7  PLT 283 267 252 323 364   BMP &GFR Recent Labs  Lab 09/05/21 0308 09/06/21 0307 09/06/21 1342 09/07/21 0321 09/08/21 0419  NA 137 141 139 141 143  K 4.9 5.3* 4.7 4.4 3.9  CL 107 110 109 108 107  CO2 20* 18* 21* 23 24  GLUCOSE 332* 132* 223* 145* 136*  BUN 51* 53* 51* 49* 40*  CREATININE 3.80* 3.39* 3.11* 2.70* 2.06*  CALCIUM 8.1* 8.4* 8.7* 8.8* 8.9  MG  --   --    --   --  1.2*  PHOS  --   --   --   --  3.2   Estimated Creatinine Clearance: 33.8 mL/min (A) (by C-G formula based on SCr of 2.06 mg/dL (H)). Liver & Pancreas: Recent Labs  Lab 09/04/21 1858 09/08/21 0419  AST 13*  --   ALT 13  --   ALKPHOS 139*  --   BILITOT 0.8  --   PROT 8.0  --   ALBUMIN 2.8* 2.6*   No results for input(s): "LIPASE", "AMYLASE" in the last 168 hours. No results for input(s): "AMMONIA" in the last 168 hours. Diabetic: No results for input(s): "HGBA1C" in the last 72 hours.  Recent Labs  Lab 09/07/21 1943 09/07/21 2339 09/08/21 0435 09/08/21 0724 09/08/21 1148  GLUCAP 173* 169* 106* 142* 141*   Cardiac Enzymes: Recent Labs  Lab 09/08/21 0419  CKTOTAL 42   No results for input(s): "PROBNP" in the last 8760 hours. Coagulation Profile: No results for input(s): "INR", "PROTIME" in the last 168 hours. Thyroid Function Tests: Recent Labs    09/08/21 0419  TSH 2.404   Lipid Profile: No results for input(s): "CHOL", "HDL", "LDLCALC", "TRIG", "CHOLHDL", "LDLDIRECT" in the last 72 hours. Anemia Panel: No results for input(s): "VITAMINB12", "FOLATE", "FERRITIN", "TIBC", "IRON", "RETICCTPCT" in the last 72 hours. Urine analysis:    Component Value Date/Time   COLORURINE YELLOW 09/04/2021 2346   APPEARANCEUR TURBID (A) 09/04/2021 2346   LABSPEC 1.013 09/04/2021 2346   PHURINE 5.0 09/04/2021 2346   GLUCOSEU NEGATIVE 09/04/2021 2346   HGBUR SMALL (A) 09/04/2021 2346   BILIRUBINUR NEGATIVE 09/04/2021 2346   KETONESUR NEGATIVE 09/04/2021 2346   PROTEINUR >=300 (A) 09/04/2021 2346   UROBILINOGEN 0.2 06/28/2014 1500   NITRITE NEGATIVE 09/04/2021 2346   LEUKOCYTESUR MODERATE (A) 09/04/2021 2346   Sepsis Labs: Invalid input(s): "PROCALCITONIN", "LACTICIDVEN"  Microbiology: Recent Results (from the past 240 hour(s))  Culture, blood (routine x 2)     Status: None (Preliminary result)   Collection Time: 09/04/21  7:40 PM   Specimen: BLOOD  Result  Value Ref Range Status   Specimen Description   Final    BLOOD BLOOD LEFT ARM Performed at [facility], [address] [place], [place] [address]    Special Requests   Final    BOTTLES DRAWN AEROBIC ONLY Blood Culture results may not be optimal due to an inadequate volume of blood received in culture bottles Performed at [facility] [address] [place], [place] [address]    Culture   Final    NO GROWTH 3 DAYS Performed at [facility], [place] [address] [place], [place] [address]    Report Status PENDING  Incomplete  Culture, blood (routine x 2)     Status: Abnormal   Collection Time: 09/04/21  7:42 PM   Specimen: BLOOD  Result Value Ref Range Status   Specimen Description   Final    BLOOD BLOOD RIGHT FOREARM Performed at [facility] [address] [place], [place] [address]    Special Requests   Final    BOTTLES DRAWN AEROBIC AND ANAEROBIC Blood Culture adequate volume Performed at [facility] [address], [place]  [address]    Culture  Setup Time   Final    GRAM NEGATIVE RODS ANAEROBIC BOTTLE ONLY CRITICAL RESULT CALLED TO, READ BACK BY AND VERIFIED WITH: Maren [place] [phone] '@[id number]'$  FH Performed at [facility], [place] [address], [place], [place] [address]    Culture KLEBSIELLA PNEUMONIAE (A)  Final   Report Status 09/08/2021 FINAL  Final   Organism ID, Bacteria KLEBSIELLA PNEUMONIAE  Final      Susceptibility   Klebsiella pneumoniae - MIC*    AMPICILLIN >=32 RESISTANT Resistant     CEFAZOLIN <=4 SENSITIVE Sensitive     CEFEPIME <=0.12 SENSITIVE Sensitive     CEFTAZIDIME <=1 SENSITIVE Sensitive     CEFTRIAXONE <=0.25 SENSITIVE Sensitive     CIPROFLOXACIN <=0.25 SENSITIVE Sensitive     GENTAMICIN <=1 SENSITIVE Sensitive     IMIPENEM <=0.25 SENSITIVE Sensitive     TRIMETH/SULFA <=20 SENSITIVE Sensitive     AMPICILLIN/SULBACTAM 4 SENSITIVE Sensitive      PIP/TAZO <=4 SENSITIVE Sensitive     * KLEBSIELLA PNEUMONIAE  Blood Culture ID Panel (Reflexed)     Status: Abnormal   Collection Time: 09/04/21  7:42 PM  Result Value Ref Range Status   Enterococcus faecalis NOT DETECTED NOT DETECTED Final   Enterococcus Faecium NOT DETECTED NOT DETECTED Final   Listeria monocytogenes NOT DETECTED NOT DETECTED Final   Staphylococcus species NOT DETECTED NOT DETECTED Final   Staphylococcus aureus (BCID) NOT DETECTED NOT DETECTED Final   Staphylococcus epidermidis NOT DETECTED NOT DETECTED Final   Staphylococcus lugdunensis NOT DETECTED NOT DETECTED Final   Streptococcus species NOT DETECTED NOT DETECTED Final   Streptococcus agalactiae NOT DETECTED NOT DETECTED Final   Streptococcus pneumoniae NOT DETECTED NOT DETECTED Final   Streptococcus pyogenes NOT DETECTED NOT DETECTED Final   A.calcoaceticus-baumannii NOT DETECTED NOT DETECTED Final   Bacteroides fragilis NOT DETECTED NOT DETECTED Final   Enterobacterales DETECTED (A) NOT DETECTED Final    Comment: Enterobacterales represent a large order of gram negative bacteria, not a single organism. CRITICAL RESULT CALLED TO, READ BACK BY AND VERIFIED WITH: PHARMD ABuford Dresser [phone] '@[id number]'$  FH    Enterobacter cloacae complex NOT DETECTED NOT DETECTED Final   Escherichia coli NOT DETECTED NOT DETECTED Final   Klebsiella aerogenes NOT DETECTED NOT DETECTED Final   Klebsiella oxytoca NOT DETECTED NOT DETECTED Final   Klebsiella pneumoniae DETECTED (A) NOT DETECTED Final    Comment: CRITICAL RESULT CALLED TO, READ BACK BY  AND VERIFIED WITH: PHARMD ABuford Dresser [phone] '@1853'$  FH    Proteus species NOT DETECTED NOT DETECTED Final   Salmonella species NOT DETECTED NOT DETECTED Final   Serratia marcescens NOT DETECTED NOT DETECTED Final   Haemophilus influenzae NOT DETECTED NOT DETECTED Final   Neisseria meningitidis NOT DETECTED NOT DETECTED Final   Pseudomonas aeruginosa NOT DETECTED NOT DETECTED Final    Stenotrophomonas maltophilia NOT DETECTED NOT DETECTED Final   Candida albicans NOT DETECTED NOT DETECTED Final   Candida auris NOT DETECTED NOT DETECTED Final   Candida glabrata NOT DETECTED NOT DETECTED Final   Candida krusei NOT DETECTED NOT DETECTED Final   Candida parapsilosis NOT DETECTED NOT DETECTED Final   Candida tropicalis NOT DETECTED NOT DETECTED Final   Cryptococcus neoformans/gattii NOT DETECTED NOT DETECTED Final   CTX-M ESBL NOT DETECTED NOT DETECTED Final   Carbapenem resistance IMP NOT DETECTED NOT DETECTED Final   Carbapenem resistance KPC NOT DETECTED NOT DETECTED Final   Carbapenem resistance NDM NOT DETECTED NOT DETECTED Final   Carbapenem resist OXA 48 LIKE NOT DETECTED NOT DETECTED Final   Carbapenem resistance VIM NOT DETECTED NOT DETECTED Final    Comment: Performed at [facility], [address], [place], [place] [address]  Urine Culture     Status: Abnormal   Collection Time: 09/04/21 11:46 PM   Specimen: Urine, Clean Catch  Result Value Ref Range Status   Specimen Description   Final    URINE, CLEAN CATCH Performed at [facility], [place] [address], [place], [place] [address]    Special Requests   Final    NONE Performed at [facility], [place] [address], [place], [place] [address]    Culture 50,000 COLONIES/mL KLEBSIELLA PNEUMONIAE (A)  Final   Report Status 09/07/2021 FINAL  Final   Organism ID, Bacteria KLEBSIELLA PNEUMONIAE (A)  Final      Susceptibility   Klebsiella pneumoniae - MIC*    AMPICILLIN >=32 RESISTANT Resistant     CEFAZOLIN <=4 SENSITIVE Sensitive     CEFEPIME <=0.12 SENSITIVE Sensitive     CEFTRIAXONE <=0.25 SENSITIVE Sensitive     CIPROFLOXACIN <=0.25 SENSITIVE Sensitive     GENTAMICIN <=1 SENSITIVE Sensitive     IMIPENEM 0.5 SENSITIVE Sensitive     NITROFURANTOIN 128 RESISTANT Resistant     TRIMETH/SULFA <=20 SENSITIVE Sensitive     AMPICILLIN/SULBACTAM 8 SENSITIVE Sensitive      PIP/TAZO <=4 SENSITIVE Sensitive     * 50,000 COLONIES/mL KLEBSIELLA PNEUMONIAE  SARS Coronavirus 2 by RT PCR (hospital order, performed in [place] hospital lab) *cepheid single result test* Anterior Nasal Swab     Status: None   Collection Time: 09/05/21  1:32 AM   Specimen: Anterior Nasal Swab  Result Value Ref Range Status   SARS Coronavirus 2 by RT PCR NEGATIVE NEGATIVE Final    Comment: (NOTE) SARS-CoV-2 target nucleic acids are NOT DETECTED.  The SARS-CoV-2 RNA is generally detectable in upper and lower respiratory specimens during the acute phase of infection. The lowest concentration of SARS-CoV-2 viral copies this assay can detect is 250 copies / mL. A negative result does not preclude SARS-CoV-2 infection and should not be used as the sole basis for treatment or other patient management decisions.  A negative result may occur with improper specimen collection / handling, submission of specimen other than nasopharyngeal swab, presence of viral mutation(s) within the areas targeted by this assay, and inadequate number of viral copies (<250 copies /  mL). A negative result must be combined with clinical observations, patient history, and epidemiological information.  Fact Sheet for Patients:   https://www.patel.info/  Fact Sheet for Healthcare Providers: https://hall.com/  This test is not yet approved or  cleared by the [place] FDA and has been authorized for detection and/or diagnosis of SARS-CoV-2 by FDA under an Emergency Use Authorization (EUA).  This EUA will remain in effect (meaning this test can be used) for the duration of the COVID-19 declaration under Section 564(b)(1) of the Act, 21 U.S.C. section 360bbb-3(b)(1), unless the authorization is terminated or revoked sooner.  Performed at [facility], [place] [address], [place], [place] [address]   MRSA Next Gen by PCR, Nasal     Status: Abnormal    Collection Time: 09/05/21  2:02 AM   Specimen: Nasal Mucosa; Nasal Swab  Result Value Ref Range Status   MRSA by PCR Next Gen DETECTED (A) NOT DETECTED Final    Comment: (NOTE) The GeneXpert MRSA Assay (FDA approved for NASAL specimens only), is one component of a comprehensive MRSA colonization surveillance program. It is not intended to diagnose MRSA infection nor to guide or monitor treatment for MRSA infections. Test performance is not FDA approved in patients less than 34 years old. Performed at [facility], [place] [address] [place], [place] [address]     Radiology Studies: No results found.    Joanmarie Tsang T. [place]  If 7PM-7AM, please contact night-coverage [email or web address] 09/08/2021, 3:02 PM

## 2021-09-08 NOTE — Progress Notes (Signed)
PT Cancellation Note  Patient Details Name: [name] [name] MRN: [id number] DOB: 1955/06/19   Cancelled Treatment:    Reason Eval/Treat Not Completed: PT screened, no needs identified, will sign off (Per prior admission ~2 years ago and discussion with RN: pt is bedbound and requires Total Assist/Care from her caregivers. No skilled acute PT needs. Pt has all needed equipement at home including hoyer lift. Will sign off at this time.)   [place], DPT Acute Rehabilitation Services Office [phone] Pager [phone]  09/08/21 9:49 AM

## 2021-09-08 NOTE — TOC Initial Note (Signed)
Transition of Care [facility]) - Initial/Assessment Note    Patient Details  Name: [name] [name] MRN: [id number] Date of Birth: 03/12/1955  Transition of [facility]) CM/SW Contact:    Joaquin Courts, RN Phone Number: 09/08/2021, 10:21 AM  Clinical Narrative:                 CM reviewed chart and noted patient is from home, has a pcp, and is currently on 2L O2.  Noted PT/OT eval is pending, will follow for possible needs.  Expected Discharge Plan: Home/Self Care Barriers to Discharge: Continued Medical Work up   Patient Goals and CMS Choice Patient states their goals for this hospitalization and ongoing recovery are:: to get better      Expected Discharge Plan and Services Expected Discharge Plan: Home/Self Care       Living arrangements for the past 2 months: Single Family Home                                      Prior Living Arrangements/Services Living arrangements for the past 2 months: Single Family Home   Patient language and need for interpreter reviewed:: Yes Do you feel safe going back to the place where you live?: Yes      Need for Family Participation in Patient Care: No (Comment) Care giver support system in place?: Yes (comment)   Criminal Activity/Legal Involvement Pertinent to Current Situation/Hospitalization: Yes - Comment as needed  Activities of Daily Living Home Assistive Devices/Equipment: Wheelchair, [employer] fast streamer, Bedside commode/3-in-1 ADL Screening (condition at time of admission) Patient's cognitive ability adequate to safely complete daily activities?: Yes Is the patient deaf or have difficulty hearing?: No Does the patient have difficulty seeing, even when wearing glasses/contacts?: No Does the patient have difficulty concentrating, remembering, or making decisions?: No Patient able to express need for assistance with ADLs?: Yes Does the patient have difficulty dressing or bathing?: Yes Independently performs ADLs?:  No Communication: Needs assistance Is this a change from baseline?: Pre-admission baseline Dressing (OT): Needs assistance Is this a change from baseline?: Pre-admission baseline Grooming: Needs assistance Is this a change from baseline?: Pre-admission baseline Feeding: Independent Bathing: Needs assistance Is this a change from baseline?: Pre-admission baseline Toileting: Needs assistance Is this a change from baseline?: Pre-admission baseline In/Out Bed: Needs assistance Is this a change from baseline?: Pre-admission baseline Walks in Home: Needs assistance Is this a change from baseline?: Pre-admission baseline Does the patient have difficulty walking or climbing stairs?: Yes Weakness of Legs: Both Weakness of Arms/Hands: None  Permission Sought/Granted                  Emotional Assessment Appearance:: Appears stated age     Orientation: : Oriented to Self, Oriented to Place, Oriented to  Time, Oriented to Situation   Psych Involvement: No (comment)  Admission diagnosis:  Acute respiratory failure with hypoxia and hypercapnia (HCC) [J96.01, J96.02] Hypotension, unspecified hypotension type [I95.9] Sepsis, due to unspecified organism, unspecified whether acute organ dysfunction present [facility]) [A41.9] Patient Active Problem List   Diagnosis Date Noted   Severe sepsis ([place]) 09/07/2021   Bacteremia due to Klebsiella pneumoniae 09/07/2021   Urinary tract infection 09/07/2021   Acute respiratory failure with hypoxia and hypercapnia ([place]) 09/04/2021   DKA (diabetic ketoacidosis) ([place]) 02/02/2021   Class 3 obesity ([place]) 02/02/2021   Hyperglycemia 02/01/2021   Pancreatitis 09/07/2020   Intractable nausea and  vomiting 09/07/2020   Functional quadriplegia ([place]) 09/07/2020   Physical debility 08/26/2020   OSA on CPAP 08/26/2020   Constipation    Acute renal failure superimposed on stage 3a chronic kidney disease, unspecified acute renal failure type ([place]) 08/25/2020    Acute renal failure superimposed on stage 3b chronic kidney disease ([place]) 07/30/2020   Lower extremity cellulitis 02/04/2020   Acute on chronic diastolic CHF (congestive heart failure) ([place]) 11/23/2019   Vaginal discharge 11/06/2019   Morbid obesity ([place]) 08/22/2019   Opiate overdose ([place]) 08/20/2019   Cellulitis 10/05/2018   Acute cystitis without hematuria 10/05/2018   Essential hypertension 10/05/2018   Pressure injury of skin 10/05/2018   Hypoglycemia secondary to sulfonylurea 11/21/2016   Substance induced mood disorder ([place]) 07/02/2014   Encephalopathy    Hypokalemia    Type 2 diabetes mellitus with stage 3 chronic kidney disease ([place])    HLD (hyperlipidemia)    Anxiety and depression    Anxiety state    PCP:  Clinic, [place]:   [facility] [place], [place] - [address] [place] [address] Phone: [phone] Fax: [phone]     Social Determinants of Health (SDOH) Interventions    Readmission Risk Interventions    09/08/2021   10:20 AM 02/06/2020    4:40 PM 11/08/2019    3:01 PM  Readmission Risk Prevention Plan  Transportation Screening Complete Complete Complete  Home Care Screening Complete    Medication Review (RN CM) Complete    Medication Review (RN Care Manager)   Referral to Pharmacy  PCP or Specialist appointment within 3-5 days of discharge  Complete Complete  HRI or Home Care Consult   Complete  SW Recovery Care/Counseling Consult  Complete Complete  Palliative Care Screening  Not Applicable Not Applicable  Skilled Nursing Facility  Complete Complete

## 2021-09-08 NOTE — Progress Notes (Signed)
OT Cancellation Note  Patient Details Name: [name] [name] MRN: [id number] DOB: Feb 26, 1955   Cancelled Treatment:    Reason Eval/Treat Not Completed: OT screened, no needs identified, will sign off. Per prior admission ~2 years ago and discussion with RN: pt is bedbound and requires Total Assist/Care from her caregivers. Has 24/7 caregivers. No skilled acute OT needs. Pt has all needed equipement at home including hoyer lift.   Reuben Knoblock L Dasean Brow 09/08/2021, 9:55 AM

## 2021-09-09 ENCOUNTER — Other Ambulatory Visit: Payer: Self-pay | Admitting: Student

## 2021-09-09 DIAGNOSIS — J9601 Acute respiratory failure with hypoxia: Secondary | ICD-10-CM | POA: Diagnosis not present

## 2021-09-09 DIAGNOSIS — E1122 Type 2 diabetes mellitus with diabetic chronic kidney disease: Secondary | ICD-10-CM | POA: Diagnosis not present

## 2021-09-09 DIAGNOSIS — N1832 Chronic kidney disease, stage 3b: Secondary | ICD-10-CM

## 2021-09-09 DIAGNOSIS — G4733 Obstructive sleep apnea (adult) (pediatric): Secondary | ICD-10-CM | POA: Diagnosis not present

## 2021-09-09 DIAGNOSIS — A419 Sepsis, unspecified organism: Secondary | ICD-10-CM | POA: Diagnosis not present

## 2021-09-09 LAB — RENAL FUNCTION PANEL
Albumin: 2.4 g/dL — ABNORMAL LOW (ref 3.5–5.0)
Anion gap: 9 (ref 5–15)
BUN: 36 mg/dL — ABNORMAL HIGH (ref 8–23)
CO2: 25 mmol/L (ref 22–32)
Calcium: 8.9 mg/dL (ref 8.9–10.3)
Chloride: 105 mmol/L (ref 98–111)
Creatinine, Ser: 1.64 mg/dL — ABNORMAL HIGH (ref 0.44–1.00)
GFR, Estimated: 34 mL/min — ABNORMAL LOW (ref >60)
Glucose, Bld: 153 mg/dL — ABNORMAL HIGH (ref 70–99)
Phosphorus: 3.1 mg/dL (ref 2.5–4.6)
Potassium: 4 mmol/L (ref 3.5–5.1)
Sodium: 139 mmol/L (ref 135–145)

## 2021-09-09 LAB — GLUCOSE, CAPILLARY
Glucose-Capillary: 148 mg/dL — ABNORMAL HIGH (ref 70–99)
Glucose-Capillary: 153 mg/dL — ABNORMAL HIGH (ref 70–99)
Glucose-Capillary: 221 mg/dL — ABNORMAL HIGH (ref 70–99)

## 2021-09-09 LAB — MAGNESIUM: Magnesium: 1.9 mg/dL (ref 1.7–2.4)

## 2021-09-09 LAB — CBC
HCT: 36.1 % (ref 36.0–46.0)
Hemoglobin: 11.2 g/dL — ABNORMAL LOW (ref 12.0–15.0)
MCH: 25.6 pg — ABNORMAL LOW (ref 26.0–34.0)
MCHC: 31 g/dL (ref 30.0–36.0)
MCV: 82.6 fL (ref 80.0–100.0)
Platelets: 383 10*3/uL (ref 150–400)
RBC: 4.37 MIL/uL (ref 3.87–5.11)
RDW: 14.6 % (ref 11.5–15.5)
WBC: 9.1 10*3/uL (ref 4.0–10.5)
nRBC: 0.2 % (ref 0.0–0.2)

## 2021-09-09 MED ORDER — KETOTIFEN FUMARATE 0.025 % OP SOLN: 1.0000 [drp] | Freq: Two times a day (BID) | OPHTHALMIC | 0 refills | Status: DC | PRN

## 2021-09-09 MED ORDER — CEFADROXIL 500 MG PO CAPS: 500.0000 mg | ORAL_CAPSULE | Freq: Two times a day (BID) | ORAL | 0 refills | Status: AC

## 2021-09-09 MED ORDER — KETOTIFEN FUMARATE 0.025 % OP SOLN
1.0000 [drp] | Freq: Two times a day (BID) | OPHTHALMIC | Status: DC
  Administered 2021-09-09: 1 [drp] via OPHTHALMIC
  Filled 2021-09-09: qty 5

## 2021-09-09 MED ORDER — INSULIN GLARGINE-YFGN 100 UNIT/ML ~~LOC~~ SOPN: 25.0000 [IU] | PEN_INJECTOR | Freq: Every day | SUBCUTANEOUS | 1 refills | Status: DC

## 2021-09-09 MED ORDER — POTASSIUM CHLORIDE CRYS ER 20 MEQ PO TBCR: 20.0000 meq | EXTENDED_RELEASE_TABLET | Freq: Every day | ORAL | 1 refills | Status: DC

## 2021-09-09 MED ORDER — TORSEMIDE 20 MG PO TABS: 40.0000 mg | ORAL_TABLET | Freq: Every day | ORAL | 1 refills | Status: DC

## 2021-09-09 NOTE — Discharge Summary (Addendum)
Physician Discharge Summary  TURA ROLLER EUM:[id number] DOB: 1955-09-07 DOA: 09/04/2021  PCP: [facility]  Admit date: 09/04/2021 Discharge date: 09/09/2021 Admitted From: Home Disposition: Home Recommendations for Outpatient Follow-up:  Follow up with PCP in 1 week Check BMP and CBC in 1 week Poorly controlled diabetes.  Emphasize good glycemic control Patient is on multiple sedating medication and with risk for respiratory distress.  Recommend reviewing Please follow up on the following pending results: None  Home Health: Patient has home aide Equipment/Devices: Patient has appropriate DME  Discharge Condition: Stable CODE STATUS: Full code  Follow-up Information     [facility]. Schedule an appointment as soon as possible for a visit in 1 week(s).   Contact information: [place] [address] [phone]                 Hospital course 66 year old F with PMH of OSA/OHS on CPAP, morbid obesity, DM-2, CKD-3B, duodenal ulcer and nonambulatory status with Harrel Lemon lift dependence brought to ED by EMS due to shortness of breath and hypoxia to 60%, and admitted for acute on chronic respiratory failure with hypoxia and hypercapnia, acute diastolic CHF, AKI, hyperkalemia and UTI.     Apparently, patient was in severe sepsis with tachypnea, tachycardia, leukocytosis, AKI and respiratory failure in the setting of urinary tract infection and bacteremia.  She was admitted to ICU.  Started on IV antibiotics, BiPAP and IV fluid.  Respiratory failure resolved.  AKI improved.  She was started on diuretics due to fluid overload/acute CHF from fluid resuscitation.  Urine culture and blood culture grew Klebsiella pneumonia.  Antibiotic de-escalated to IV ceftriaxone.  She was transferred to [facility] hospital[facility] service on 09/07/2021.   Patient continued on IV Lasix and IV ceftriaxone.  AKI resolved.  Remained stable from respiratory  standpoint.  She is discharged on p.o. cefadroxil 500 mg twice daily based on renal function.    In terms of fluid overload/acute CHF, she was dry rest with IV Lasix 60 mg daily with improvement in her renal function.  She is discharged on p.o. torsemide 40 mg daily.  We increased leg K-Dur to 20 mg daily as well.  Check renal panel in 1 week and adjust diuretics as appropriate.     See individual problem list below for more.   Problems addressed during this hospitalization Principal Problem:   Acute respiratory failure with hypoxia and hypercapnia (HCC) Active Problems:   Type 2 diabetes mellitus with stage 3 chronic kidney disease (HCC)   Anxiety and depression   Essential hypertension   Acute on chronic diastolic CHF (congestive heart failure) (HCC)   Acute renal failure superimposed on stage 3b chronic kidney disease (HCC)   OSA on CPAP   Functional quadriplegia (HCC)   Class 3 obesity (HCC)   Severe sepsis (HCC)   Bacteremia due to Klebsiella pneumoniae   Urinary tract infection   Hypomagnesemia   Acute hypoxic and hypercarbic respiratory failure OSA/OHS on home BiPAP -Continue home oxygen and BiPAP. -On multiple meds with risk for sedation and respiratory depression.  Advised to discuss this with her prescriber   Severe sepsis due to Klebsiella pneumonia bacteremia and UTI: POA.  Sepsis physiology resolved.  Blood and urine culture with Klebsiella pneumonia. -IV CTX>> IV Ancef >> p.o. cefadroxil for 4 more days to complete a total of 8 days course   Acute on chronic diastolic CHF: TTE with LVEF of 60 to 65%, G1 DD, grossly enlarged RF  but poorly visualized.  On p.o. Lasix 40 mg MWF at home.  Difficult to assess fluid status.  Exacerbation likely due to fluid resuscitation in the setting of severe sepsis.  Diuresing with IV Lasix 60 mg daily.  AKI resolved. -Discharged on p.o. torsemide 40 mg daily.  Discontinued p.o. Lasix. -Increase p.o. KCl to 20 mg daily -Reassess fluid  status and renal function at follow-up   Elevated troponin: Likely demand ischemia in the setting of the above.  TTE reassuring.  EKG without significant finding.   AKI/azotemia superimposed on CKD-3B: Multifactorial including hypotension, sepsis and CHF.  No hydro on CT abdomen and pelvis.  AKI resolved..  Recent Labs    02/03/21 0536 04/21/21 1532 06/13/21 1546 09/04/21 1858 09/05/21 0308 09/06/21 0307 09/06/21 1342 09/07/21 0321 09/08/21 0419 09/09/21 0427  BUN 59* 17 36* 48* 51* 53* 51* 49* 40* 36*  CREATININE 2.16* 0.97 1.35* 3.57* 3.80* 3.39* 3.11* 2.70* 2.06* 1.64*  -Recheck renal function in 1 week   Uncontrolled IDDM-2 with hyperglycemia and CKD-3B: A1c 15.1%. -Continue home insulin and Ozempic -Continue Lipitor  Addendum Increased basal insulin from 7 to 25 units.  Sent new Rx to her pharmacy.  Attempted to call patient but she did not answer.  Her voicemail was full.  His son's phone does not work.  Called and updated her sister, Isaac Bliss.  She agreed to update patient.  She appreciated the call as well   Anxiety and depression: Stable -Continue home medications    History of duodenal ulcer -Continue PPI   Hyperkalemia/hypomagnesemia: Resolved.  Morbid obesity/functional quadriplegia: Hoyer lift dependent at baseline.  She has 4 aides alternating Body mass index is 51.69 kg/m. -Continue home Ozempic.           Vital signs Vitals:   09/09/21 0500 09/09/21 0522 09/09/21 0700 09/09/21 0746  BP:  (!) 108/57    Pulse:  68  74  Temp:  98.4 F (36.9 C)    Resp:  18 (!) 29 16  Height:      Weight: 128.2 kg     SpO2:  94%  94%  TempSrc:  Oral    BMI (Calculated): 51.68        Discharge exam  GENERAL: No apparent distress.  Nontoxic. HEENT: MMM.  Vision and hearing grossly intact.  NECK: Supple.  Difficult to assess JVD due to body habitus. RESP:  No IWOB.  Fair aeration bilaterally but limited exam due to body habitus. CVS:  RRR. Heart sounds  normal.  ABD/GI/GU: BS+. Abd soft, NTND.  MSK/EXT: BLE weakness.  No apparent deformity. No edema but limited exam.  SKIN: no apparent skin lesion or wound NEURO: Awake and alert. Oriented appropriately.  No apparent focal neuro deficit other than chronic BLE weakness. PSYCH: Calm. Normal affect.   Discharge Instructions Discharge Instructions     Call MD for:  difficulty breathing, headache or visual disturbances   Complete by: As directed    Call MD for:  extreme fatigue   Complete by: As directed    Call MD for:  persistant dizziness or light-headedness   Complete by: As directed    Diet - low sodium heart healthy   Complete by: As directed    Diet Carb Modified   Complete by: As directed    Discharge instructions   Complete by: As directed    It has been a pleasure taking care of you!  You were hospitalized due to respiratory failure, bloodstream and urinary tract  infection and acute kidney injury.  You have been treated with BiPAP, antibiotics and fluid medication.  Your symptoms and kidney function improved.  We are discharging you on more antibiotics to complete treatment course.  We have also made some changes to your fluid medication during this hospitalization.  Please review your new medication list and the directions on your medications before you take them.  It is very important that you use your CPAP/BiPAP at night and when you sleep.  Be aware that combination of hydrocodone, hydroxyzine, Lyrica and Remeron can cause respiratory distress, drowsiness, sedation, constipation, impaired judgment, impaired balance and even death.  We strongly recommend you discuss about these medications with your prescriber to see if you can come off them safely or decrease the dose.   Take care,   Increase activity slowly   Complete by: As directed    No wound care   Complete by: As directed       Allergies as of 09/09/2021       Reactions   Lisinopril Swelling   Morphine Itching,  Rash   Penicillins Hives, Itching, Nausea And Vomiting, Rash   Has patient had a PCN reaction causing immediate rash, facial/tongue/throat swelling, SOB or lightheadedness with hypotension: No Has patient had a PCN reaction causing severe rash involving mucus membranes or skin necrosis: No Has patient had a PCN reaction that required hospitalization: No Has patient had a PCN reaction occurring within the last 10 years: No If all of the above answers are "NO", then may proceed with Cephalosporin use.        Medication List     STOP taking these medications    furosemide 40 MG tablet Commonly known as: LASIX   ibuprofen 800 MG tablet Commonly known as: ADVIL       TAKE these medications    acetaminophen 500 MG tablet Commonly known as: TYLENOL Take 500 mg by mouth every 6 (six) hours as needed.   albuterol 108 (90 Base) MCG/ACT inhaler Commonly known as: VENTOLIN HFA Inhale 2 puffs into the lungs every 6 (six) hours as needed for wheezing or shortness of breath.   atorvastatin 10 MG tablet Commonly known as: LIPITOR Take 10 mg by mouth at bedtime.   cefadroxil 500 MG capsule Commonly known as: DURICEF Take 1 capsule (500 mg total) by mouth 2 (two) times daily for 4 days.   cetirizine 10 MG tablet Commonly known as: ZYRTEC Take 10 mg by mouth daily.   Ensure Take 237 mLs by mouth 2 (two) times daily between meals.   escitalopram 10 MG tablet Commonly known as: LEXAPRO Take 1 tablet (10 mg total) by mouth every morning.   HYDROcodone-acetaminophen 5-325 MG tablet Commonly known as: NORCO/VICODIN Take 1 tablet by mouth 3 (three) times daily as needed (pain).   hydrOXYzine 25 MG capsule Commonly known as: VISTARIL Take 25 mg by mouth 3 (three) times daily as needed for itching or anxiety.   insulin glargine-yfgn 100 UNIT/ML Pen Commonly known as: SEMGLEE 7 Units daily.   ketotifen 0.025 % ophthalmic solution Commonly known as: ZADITOR Place 1 drop into  both eyes 2 (two) times daily as needed.   mirtazapine 15 MG tablet Commonly known as: REMERON Take 15 mg by mouth at bedtime as needed (sleep).   Nyamyc powder Generic drug: nystatin Apply to affected area 2 (two) times daily to groin and areas of fungal infection   nystatin cream Commonly known as: MYCOSTATIN Apply 1 Application topically 2 (two)  times daily.   pantoprazole 40 MG tablet Commonly known as: PROTONIX Take 40 mg by mouth daily before breakfast.   potassium chloride SA 20 MEQ tablet Commonly known as: KLOR-CON M Take 1 tablet (20 mEq total) by mouth daily. What changed:  how to take this when to take this   pregabalin 75 MG capsule Commonly known as: LYRICA Take 75 mg by mouth 2 (two) times daily.   Semaglutide(0.25 or 0.'5MG'$ /DOS) 2 MG/1.5ML Sopn Inject 2 mg into the skin every Thursday.   torsemide 20 MG tablet Commonly known as: DEMADEX Take 2 tablets (40 mg total) by mouth daily.   Unifine Pentips 31G X 5 MM Misc Generic drug: Insulin Pen Needle Use as directed daily        Consultations: Pulmonology admitted patient.  Procedures/Studies:   DG Chest Port 1 View  Result Date: 09/06/2021 CLINICAL DATA:  Hypoxic respiratory failure. EXAM: PORTABLE CHEST 1 VIEW COMPARISON:  Portable 09/04/2021 FINDINGS: 3:32 a.m. The patient rotated to the left. The heart is enlarged as before but today there is increased perihilar vascular congestion and increased generalized interstitial consolidation consistent with edema. Small pleural effusions are beginning to develop. Interstitial haziness in the lung bases in the setting of somewhat low inspiration, could indicate atelectasis, ground-glass edema, pneumonitis or layering effusions. No other focal lung opacity is seen. The mediastinum is stable. There is thoracic spondylosis. IMPRESSION: Onset perihilar vascular congestion with mild generalized interstitial edema since 09/04/2021, with small pleural effusions  developing. Bibasilar hazy opacities with differential diagnosis as above. Electronically Signed   By: Telford Nab M.D.   On: 09/06/2021 04:35   VAS [place] LOWER EXTREMITY VENOUS (DVT)  Result Date: 09/05/2021  Lower Venous DVT Study Patient Name:  FAUSTINE TATES  Date of Exam:   09/05/2021 Medical Rec #: [id number]           Accession #:    [id number] Date of Birth: 25-Jun-1955           Patient Gender: F Patient Age:   74 years Exam Location[facility]  [facility] Procedure:      VAS [place] LOWER EXTREMITY VENOUS (DVT) Referring Phys: [facility] --------------------------------------------------------------------------------  Indications: SOB, and and elevated d-dimer (1.60).  Risk Factors: Obesity. Limitations: Body habitus and poor ultrasound/tissue interface. Comparison Study: Previous exam on 04/21/21 was negative for DVT Performing Technologist: Rogelia Rohrer RVT, RDMS  Examination Guidelines: A complete evaluation includes B-mode imaging, spectral Doppler, color Doppler, and power Doppler as needed of all accessible portions of each vessel. Bilateral testing is considered an integral part of a complete examination. Limited examinations for reoccurring indications may be performed as noted. The reflux portion of the exam is performed with the patient in reverse Trendelenburg.  +--------+---------------+---------+-----------+------------+-----------------+ RIGHT   CompressibilityPhasicitySpontaneityProperties  Thrombus Aging    +--------+---------------+---------+-----------+------------+-----------------+ CFV     Full           No       Yes        pulsatile                                                                flow                          +--------+---------------+---------+-----------+------------+-----------------+  SFJ     Full                                                             +--------+---------------+---------+-----------+------------+-----------------+ FV Prox  Full           No       Yes        pulsatile                                                                flow                          +--------+---------------+---------+-----------+------------+-----------------+ FV Mid  Full           No       Yes        pulsatile                                                                flow                          +--------+---------------+---------+-----------+------------+-----------------+ FV                                                     Not visualized    Distal                                                                   +--------+---------------+---------+-----------+------------+-----------------+ PFV                                                    Not well                                                                 visualized        +--------+---------------+---------+-----------+------------+-----------------+ POP     Full           Yes      Yes                                      +--------+---------------+---------+-----------+------------+-----------------+  PTV     Full                                                             +--------+---------------+---------+-----------+------------+-----------------+ PERO                                                   Not well                                                                 visualized        +--------+---------------+---------+-----------+------------+-----------------+   Right Technical Findings: Distal femoral vein and PFV not visualized on this exam. Limited exam due to habitus.  +--------+---------------+---------+-----------+------------+-----------------+ LEFT    CompressibilityPhasicitySpontaneityProperties  Thrombus Aging    +--------+---------------+---------+-----------+------------+-----------------+ CFV                    No       Yes        pulsatile   Not well                                                      flow        visualized        +--------+---------------+---------+-----------+------------+-----------------+ SFJ                                                    not visualized    +--------+---------------+---------+-----------+------------+-----------------+ FV Prox Full           No       No         pulsatile                                                                flow                          +--------+---------------+---------+-----------+------------+-----------------+ FV Mid  Full           No       Yes        pulsatile  flow                          +--------+---------------+---------+-----------+------------+-----------------+ FV      Full           No       Yes        pulsatile                     Distal                                     flow                          +--------+---------------+---------+-----------+------------+-----------------+ PFV                                                    not visualized    +--------+---------------+---------+-----------+------------+-----------------+ POP     Full           No       Yes        pulsatile                                                                flow                          +--------+---------------+---------+-----------+------------+-----------------+ PTV                                                    Not well                                                                 visualized        +--------+---------------+---------+-----------+------------+-----------------+ PERO    Full                                                             +--------+---------------+---------+-----------+------------+-----------------+   Left Technical Findings: SFJ and PFV not seen on this exam. Limited exam due to habitus.   Summary: BILATERAL:  -No evidence of popliteal cyst, bilaterally. RIGHT: - There is no evidence of deep vein thrombosis in the lower extremity. However, portions of this examination were limited- see technologist comments above.  LEFT: - There is no evidence of deep vein thrombosis in the lower extremity. However, portions of this examination were limited- see technologist comments above.  *See table(s)  above for measurements and observations. Electronically signed by Harold Barban MD on 09/05/2021 at 7:15:09 PM.    Final    ECHOCARDIOGRAM COMPLETE  Result Date: 09/05/2021    ECHOCARDIOGRAM REPORT   Patient Name:   JOEY HUDOCK Date of Exam: 09/05/2021 Medical Rec #:  [id number]          Height:       62.0 in Accession #:    [id number]         Weight:       290.0 lb Date of Birth:  19-Jun-1955          BSA:          2.239 m Patient Age:    42 years           BP:           102/62 mmHg Patient Gender: F                  HR:           77 bpm. Exam Location:  Inpatient Procedure: 2D Echo, Cardiac Doppler and Color Doppler Indications:    Elevated Troponin  History:        Patient has prior history of Echocardiogram examinations, most                 recent 07/26/2020. Risk Factors:Diabetes and Dyslipidemia.  Sonographer:    Luane School RDCS Referring Phys: [place]  1. Left ventricular ejection fraction, by estimation, is 60 to 65%. The left ventricle has normal function. Left ventricular endocardial border not optimally defined to evaluate regional wall motion. There is moderate left ventricular hypertrophy. Left ventricular diastolic parameters are consistent with Grade I diastolic dysfunction (impaired relaxation).  2. RV poorly visualized. Grossly appears enlarged with decreased systolic function. . Right ventricular systolic function was not well visualized. The right ventricular size is not well visualized. Tricuspid regurgitation signal is inadequate for assessing PA pressure.  3. The mitral valve was not  well visualized. No evidence of mitral valve regurgitation. No evidence of mitral stenosis.  4. The aortic valve is tricuspid. Aortic valve regurgitation is not visualized. No aortic stenosis is present.  5. The inferior vena cava is normal in size with greater than 50% respiratory variability, suggesting right atrial pressure of 3 mmHg. FINDINGS  Left Ventricle: LVOT VTI and velocities are underestimated based on angle of acquisition. Left ventricular ejection fraction, by estimation, is 60 to 65%. The left ventricle has normal function. Left ventricular endocardial border not optimally defined to evaluate regional wall motion. The left ventricular internal cavity size was normal in size. There is moderate left ventricular hypertrophy. Left ventricular diastolic parameters are consistent with Grade I diastolic dysfunction (impaired relaxation).  Normal left ventricular filling pressure. Right Ventricle: RV poorly visualized. Grossly appears enlarged with decreased systolic function. The right ventricular size is not well visualized. Right vetricular wall thickness was not well visualized. Right ventricular systolic function was not well  visualized. Tricuspid regurgitation signal is inadequate for assessing PA pressure. Left Atrium: Left atrial size was not well visualized. Right Atrium: Right atrial size was not well visualized. Pericardium: The pericardium was not well visualized. Mitral Valve: The mitral valve was not well visualized. No evidence of mitral valve regurgitation. No evidence of mitral valve stenosis. Tricuspid Valve: The tricuspid valve is not well visualized. Tricuspid valve regurgitation is mild . No evidence of tricuspid stenosis. Aortic Valve: The aortic valve is tricuspid.  Aortic valve regurgitation is not visualized. No aortic stenosis is present. Aortic valve mean gradient measures 1.1 mmHg. Aortic valve peak gradient measures 2.6 mmHg. Aortic valve area, by VTI measures 2.09 cm. Pulmonic  Valve: The pulmonic valve was not well visualized. Pulmonic valve regurgitation is not visualized. No evidence of pulmonic stenosis. Aorta: The aortic root and ascending aorta are structurally normal, with no evidence of dilitation. Venous: The inferior vena cava is normal in size with greater than 50% respiratory variability, suggesting right atrial pressure of 3 mmHg. IAS/Shunts: The interatrial septum was not well visualized.  LEFT VENTRICLE PLAX 2D LVIDd:         3.80 cm   Diastology LVIDs:         2.20 cm   LV e' medial:    6.31 cm/s LV PW:         1.30 cm   LV E/e' medial:  10.8 LV IVS:        1.30 cm   LV e' lateral:   3.15 cm/s LVOT diam:     1.90 cm   LV E/e' lateral: 21.6 LV SV:         32 LV SV Index:   14 LVOT Area:     2.84 cm  RIGHT VENTRICLE            IVC RV S prime:     8.16 cm/s  IVC diam: 1.60 cm TAPSE (M-mode): 1.5 cm LEFT ATRIUM             Index        RIGHT ATRIUM           Index LA diam:        3.80 cm 1.70 cm/m   RA Area:     13.00 cm LA Vol (A2C):   33.9 ml 15.14 ml/m  RA Volume:   30.30 ml  13.53 ml/m LA Vol (A4C):   48.3 ml 21.57 ml/m LA Biplane Vol: 42.6 ml 19.03 ml/m  AORTIC VALVE AV Area (Vmax):    2.19 cm AV Area (Vmean):   2.28 cm AV Area (VTI):     2.09 cm AV Vmax:           80.48 cm/s AV Vmean:          48.019 cm/s AV VTI:            0.152 m AV Peak Grad:      2.6 mmHg AV Mean Grad:      1.1 mmHg LVOT Vmax:         62.10 cm/s LVOT Vmean:        38.600 cm/s LVOT VTI:          0.112 m LVOT/AV VTI ratio: 0.74  AORTA Ao Root diam: 3.10 cm Ao Asc diam:  3.10 cm Ao Desc diam: 2.10 cm MV E velocity: 68.10 cm/s MV A velocity: 83.10 cm/s  SHUNTS MV E/A ratio:  0.82        Systemic VTI:  0.11 m                            Systemic Diam: 1.90 cm Carlyle Dolly MD Electronically signed by Carlyle Dolly MD Signature Date/Time: 09/05/2021/12:22:36 PM    Final    US RENAL  Result Date: 09/05/2021 CLINICAL DATA:  Acute renal insufficiency EXAM: RENAL / URINARY TRACT ULTRASOUND  COMPLETE COMPARISON:  CT 06/13/2021, sonogram 07/26/2020 FINDINGS: Imaging is limited  by the patient's body habitus and relative depth of the kidneys. Right Kidney: Renal measurements: 8.3 x 4.4 x 3.7 cm = volume: 71 mL. The lower pole is obscured by overlying bowel gas. Echogenicity within normal limits. No mass or hydronephrosis visualized. Left Kidney: Renal measurements: 10.9 x 6.1 x 6.2 cm = volume: 214 mL. The upper and lower pole is obscured by overlying bowel gas. Echogenicity within normal limits. No mass or hydronephrosis visualized. Bladder: Not well visualized on the presented images. Other: None. IMPRESSION: Technically limited examination.  No hydronephrosis. Electronically Signed   By: Fidela Salisbury M.D.   On: 09/05/2021 00:12   CT CHEST WO CONTRAST  Result Date: 09/04/2021 CLINICAL DATA:  Positive D-dimer, shortness of breath, hypoxia EXAM: CT CHEST WITHOUT CONTRAST TECHNIQUE: Multidetector CT imaging of the chest was performed following the standard protocol without IV contrast. RADIATION DOSE REDUCTION: This exam was performed according to the departmental dose-optimization program which includes automated exposure control, adjustment of the mA and/or kV according to patient size and/or use of iterative reconstruction technique. COMPARISON:  09/04/2021 FINDINGS: Cardiovascular: Unenhanced imaging of the heart demonstrates cardiomegaly without pericardial effusion. Normal caliber of the thoracic aorta. Atherosclerosis of the aorta and coronary vasculature. Evaluation of the vascular lumen is limited without IV contrast. Pulmonary emboli cannot be evaluated on unenhanced exam. Mediastinum/Nodes: No enlarged mediastinal or axillary lymph nodes. Thyroid gland, trachea, and esophagus demonstrate no significant findings. Lungs/Pleura: No acute airspace disease, effusion, or pneumothorax. Scattered hypoventilatory changes are seen within the dependent lower lobes. Central airways are patent. Upper  Abdomen: No acute abnormality. Musculoskeletal: No acute or destructive bony lesions. Reconstructed images demonstrate no additional findings. IMPRESSION: 1. No acute intrathoracic process. Please note that pulmonary emboli cannot be excluded without intravenous contrast administration. 2. Cardiomegaly. 3. Aortic Atherosclerosis (ICD10-I70.0). Coronary artery atherosclerosis. Electronically Signed   By: Randa Ngo M.D.   On: 09/04/2021 21:44   DG Chest Port 1 View  Result Date: 09/04/2021 CLINICAL DATA:  Shortness of breath.  Hypoxia. EXAM: PORTABLE CHEST 1 VIEW COMPARISON:  02/01/2021 FINDINGS: Patient is rotated. Lung volumes are low limiting assessment. Stable heart size and mediastinal contours, borderline cardiomegaly. There is mild peribronchial thickening. No focal airspace disease. No pneumothorax or large pleural effusion. IMPRESSION: Low lung volumes with peribronchial thickening that may be pulmonary edema or bronchitis. Electronically Signed   By: Keith Rake M.D.   On: 09/04/2021 19:15       The results of significant diagnostics from this hospitalization (including imaging, microbiology, ancillary and laboratory) are listed below for reference.     Microbiology: Recent Results (from the past 240 hour(s))  Culture, blood (routine x 2)     Status: None (Preliminary result)   Collection Time: 09/04/21  7:40 PM   Specimen: BLOOD  Result Value Ref Range Status   Specimen Description   Final    BLOOD BLOOD LEFT ARM Performed at [facility] [address] [place], [place] [address]    Special Requests   Final    BOTTLES DRAWN AEROBIC ONLY Blood Culture results may not be optimal due to an inadequate volume of blood received in culture bottles Performed at [facility] [address], [place], [place] [address]    Culture   Final    NO GROWTH 4 DAYS Performed at [facility], [place] [address], [place],  [address]     Report Status PENDING  Incomplete  Culture, blood (routine x 2)  Status: Abnormal   Collection Time: 09/04/21  7:42 PM   Specimen: BLOOD  Result Value Ref Range Status   Specimen Description   Final    BLOOD BLOOD RIGHT FOREARM Performed at [facility] [address], [place], [place] [address]    Special Requests   Final    BOTTLES DRAWN AEROBIC AND ANAEROBIC Blood Culture adequate volume Performed at [facility] [address], [place], [place] [address]    Culture  Setup Time   Final    GRAM NEGATIVE RODS ANAEROBIC BOTTLE ONLY CRITICAL RESULT CALLED TO, READ BACK BY AND VERIFIED WITH: Maren Beach [id number] '@[id number]'$  FH Performed at [facility], [place] [address] [place], [place] [address]    Culture KLEBSIELLA PNEUMONIAE (A)  Final   Report Status 09/08/2021 FINAL  Final   Organism ID, Bacteria KLEBSIELLA PNEUMONIAE  Final      Susceptibility   Klebsiella pneumoniae - MIC*    AMPICILLIN >=32 RESISTANT Resistant     CEFAZOLIN <=4 SENSITIVE Sensitive     CEFEPIME <=0.12 SENSITIVE Sensitive     CEFTAZIDIME <=1 SENSITIVE Sensitive     CEFTRIAXONE <=0.25 SENSITIVE Sensitive     CIPROFLOXACIN <=0.25 SENSITIVE Sensitive     GENTAMICIN <=1 SENSITIVE Sensitive     IMIPENEM <=0.25 SENSITIVE Sensitive     TRIMETH/SULFA <=20 SENSITIVE Sensitive     AMPICILLIN/SULBACTAM 4 SENSITIVE Sensitive     PIP/TAZO <=4 SENSITIVE Sensitive     * KLEBSIELLA PNEUMONIAE  Blood Culture ID Panel (Reflexed)     Status: Abnormal   Collection Time: 09/04/21  7:42 PM  Result Value Ref Range Status   Enterococcus faecalis NOT DETECTED NOT DETECTED Final   Enterococcus Faecium NOT DETECTED NOT DETECTED Final   Listeria monocytogenes NOT DETECTED NOT DETECTED Final   Staphylococcus species NOT DETECTED NOT DETECTED Final   Staphylococcus aureus (BCID) NOT DETECTED NOT DETECTED Final   Staphylococcus epidermidis NOT DETECTED NOT DETECTED Final    Staphylococcus lugdunensis NOT DETECTED NOT DETECTED Final   Streptococcus species NOT DETECTED NOT DETECTED Final   Streptococcus agalactiae NOT DETECTED NOT DETECTED Final   Streptococcus pneumoniae NOT DETECTED NOT DETECTED Final   Streptococcus pyogenes NOT DETECTED NOT DETECTED Final   A.calcoaceticus-baumannii NOT DETECTED NOT DETECTED Final   Bacteroides fragilis NOT DETECTED NOT DETECTED Final   Enterobacterales DETECTED (A) NOT DETECTED Final    Comment: Enterobacterales represent a large order of gram negative bacteria, not a single organism. CRITICAL RESULT CALLED TO, READ BACK BY AND VERIFIED WITH: PHARMD ABuford Dresser [id number] '@[id number]'$  FH    Enterobacter cloacae complex NOT DETECTED NOT DETECTED Final   Escherichia coli NOT DETECTED NOT DETECTED Final   Klebsiella aerogenes NOT DETECTED NOT DETECTED Final   Klebsiella oxytoca NOT DETECTED NOT DETECTED Final   Klebsiella pneumoniae DETECTED (A) NOT DETECTED Final    Comment: CRITICAL RESULT CALLED TO, READ BACK BY AND VERIFIED WITH: PHARMD ABuford Dresser [id number] '@[id number]'$  FH    Proteus species NOT DETECTED NOT DETECTED Final   Salmonella species NOT DETECTED NOT DETECTED Final   Serratia marcescens NOT DETECTED NOT DETECTED Final   Haemophilus influenzae NOT DETECTED NOT DETECTED Final   Neisseria meningitidis NOT DETECTED NOT DETECTED Final   Pseudomonas aeruginosa NOT DETECTED NOT DETECTED Final   Stenotrophomonas maltophilia NOT DETECTED NOT DETECTED Final   Candida albicans NOT DETECTED NOT DETECTED Final   Candida auris NOT DETECTED NOT DETECTED Final   Candida glabrata NOT DETECTED  NOT DETECTED Final   Candida krusei NOT DETECTED NOT DETECTED Final   Candida parapsilosis NOT DETECTED NOT DETECTED Final   Candida tropicalis NOT DETECTED NOT DETECTED Final   Cryptococcus neoformans/gattii NOT DETECTED NOT DETECTED Final   CTX-M ESBL NOT DETECTED NOT DETECTED Final   Carbapenem resistance IMP NOT DETECTED NOT DETECTED Final    Carbapenem resistance KPC NOT DETECTED NOT DETECTED Final   Carbapenem resistance NDM NOT DETECTED NOT DETECTED Final   Carbapenem resist OXA 48 LIKE NOT DETECTED NOT DETECTED Final   Carbapenem resistance VIM NOT DETECTED NOT DETECTED Final    Comment: Performed at [facility], [place] [address] [place], [place] [address]  Urine Culture     Status: Abnormal   Collection Time: 09/04/21 11:46 PM   Specimen: Urine, Clean Catch  Result Value Ref Range Status   Specimen Description   Final    URINE, CLEAN CATCH Performed at [facility], [place] [address], [place], [place] [address]    Special Requests   Final    NONE Performed at [facility], [place] [address], [place], [place] [address]    Culture 50,000 COLONIES/mL KLEBSIELLA PNEUMONIAE (A)  Final   Report Status 09/07/2021 FINAL  Final   Organism ID, Bacteria KLEBSIELLA PNEUMONIAE (A)  Final      Susceptibility   Klebsiella pneumoniae - MIC*    AMPICILLIN >=32 RESISTANT Resistant     CEFAZOLIN <=4 SENSITIVE Sensitive     CEFEPIME <=0.12 SENSITIVE Sensitive     CEFTRIAXONE <=0.25 SENSITIVE Sensitive     CIPROFLOXACIN <=0.25 SENSITIVE Sensitive     GENTAMICIN <=1 SENSITIVE Sensitive     IMIPENEM 0.5 SENSITIVE Sensitive     NITROFURANTOIN 128 RESISTANT Resistant     TRIMETH/SULFA <=20 SENSITIVE Sensitive     AMPICILLIN/SULBACTAM 8 SENSITIVE Sensitive     PIP/TAZO <=4 SENSITIVE Sensitive     * 50,000 COLONIES/mL KLEBSIELLA PNEUMONIAE  SARS Coronavirus 2 by RT PCR (hospital order, performed in [facility] hospital lab) *cepheid single result test* Anterior Nasal Swab     Status: None   Collection Time: 09/05/21  1:32 AM   Specimen: Anterior Nasal Swab  Result Value Ref Range Status   SARS Coronavirus 2 by RT PCR NEGATIVE NEGATIVE Final    Comment: (NOTE) SARS-CoV-2 target nucleic acids are NOT DETECTED.  The SARS-CoV-2 RNA is generally detectable in upper and lower respiratory specimens  during the acute phase of infection. The lowest concentration of SARS-CoV-2 viral copies this assay can detect is 250 copies / mL. A negative result does not preclude SARS-CoV-2 infection and should not be used as the sole basis for treatment or other patient management decisions.  A negative result may occur with improper specimen collection / handling, submission of specimen other than nasopharyngeal swab, presence of viral mutation(s) within the areas targeted by this assay, and inadequate number of viral copies (<250 copies / mL). A negative result must be combined with clinical observations, patient history, and epidemiological information.  Fact Sheet for Patients:   https://www.patel.info/  Fact Sheet for Healthcare Providers: https://hall.com/  This test is not yet approved or  cleared by the [place] FDA and has been authorized for detection and/or diagnosis of SARS-CoV-2 by FDA under an Emergency Use Authorization (EUA).  This EUA will remain in effect (meaning this test can be used) for the duration of the COVID-19 declaration under Section 564(b)(1) of the Act, 21 U.S.C. section 360bbb-3(b)(1), unless the authorization  is terminated or revoked sooner.  Performed at [facility], [place] [address] [place], [place] [address]   MRSA Next Gen by PCR, Nasal     Status: Abnormal   Collection Time: 09/05/21  2:02 AM   Specimen: Nasal Mucosa; Nasal Swab  Result Value Ref Range Status   MRSA by PCR Next Gen DETECTED (A) NOT DETECTED Final    Comment: (NOTE) The GeneXpert MRSA Assay (FDA approved for NASAL specimens only), is one component of a comprehensive MRSA colonization surveillance program. It is not intended to diagnose MRSA infection nor to guide or monitor treatment for MRSA infections. Test performance is not FDA approved in patients less than 22 years old. Performed at [facility],  [place] [address] [place], [place] [address]      Labs:  CBC: Recent Labs  Lab 09/04/21 1858 09/05/21 0308 09/06/21 0307 09/07/21 0321 09/08/21 0419 09/09/21 0427  WBC 14.7* 13.0* 11.7* 8.9 10.6* 9.1  NEUTROABS 11.6*  --   --   --   --   --   HGB 12.4 11.7* 10.1* 11.6* 11.1* 11.2*  HCT 41.0 39.1 35.0* 39.2 36.4 36.1  MCV 83.2 86.3 88.4 84.3 82.7 82.6  PLT 283 267 252 323 364 383   BMP &GFR Recent Labs  Lab 09/06/21 0307 09/06/21 1342 09/07/21 0321 09/08/21 0419 09/09/21 0427  NA 141 139 141 143 139  K 5.3* 4.7 4.4 3.9 4.0  CL 110 109 108 107 105  CO2 18* 21* '23 24 25  '$ GLUCOSE 132* 223* 145* 136* 153*  BUN 53* 51* 49* 40* 36*  CREATININE 3.39* 3.11* 2.70* 2.06* 1.64*  CALCIUM 8.4* 8.7* 8.8* 8.9 8.9  MG  --   --   --  1.2* 1.9  PHOS  --   --   --  3.2 3.1   Estimated Creatinine Clearance: 42.1 mL/min (A) (by C-G formula based on SCr of 1.64 mg/dL (H)). Liver & Pancreas: Recent Labs  Lab 09/04/21 1858 09/08/21 0419 09/09/21 0427  AST 13*  --   --   ALT 13  --   --   ALKPHOS 139*  --   --   BILITOT 0.8  --   --   PROT 8.0  --   --   ALBUMIN 2.8* 2.6* 2.4*   No results for input(s): "LIPASE", "AMYLASE" in the last 168 hours. No results for input(s): "AMMONIA" in the last 168 hours. Diabetic: No results for input(s): "HGBA1C" in the last 72 hours. Recent Labs  Lab 09/08/21 1629 09/08/21 1951 09/09/21 0011 09/09/21 0405 09/09/21 0736  GLUCAP 176* 149* 221* 153* 148*   Cardiac Enzymes: Recent Labs  Lab 09/08/21 0419  CKTOTAL 42   No results for input(s): "PROBNP" in the last 8760 hours. Coagulation Profile: No results for input(s): "INR", "PROTIME" in the last 168 hours. Thyroid Function Tests: Recent Labs    09/08/21 0419  TSH 2.404   Lipid Profile: No results for input(s): "CHOL", "HDL", "LDLCALC", "TRIG", "CHOLHDL", "LDLDIRECT" in the last 72 hours. Anemia Panel: No results for input(s): "VITAMINB12", "FOLATE", "FERRITIN", "TIBC",  "IRON", "RETICCTPCT" in the last 72 hours. Urine analysis:    Component Value Date/Time   COLORURINE YELLOW 09/04/2021 2346   APPEARANCEUR TURBID (A) 09/04/2021 2346   LABSPEC 1.013 09/04/2021 2346   PHURINE 5.0 09/04/2021 2346   GLUCOSEU NEGATIVE 09/04/2021 2346   HGBUR SMALL (A) 09/04/2021 [place] NEGATIVE 09/04/2021 2346   KETONESUR NEGATIVE 09/04/2021 2346  PROTEINUR >=300 (A) 09/04/2021 2346   UROBILINOGEN 0.2 06/28/2014 1500   NITRITE NEGATIVE 09/04/2021 2346   LEUKOCYTESUR MODERATE (A) 09/04/2021 2346   Sepsis Labs: Invalid input(s): "PROCALCITONIN", "LACTICIDVEN"   SIGNED:  Mercy Riding, MD  [facility] 09/09/2021, 3:35 PM

## 2021-09-09 NOTE — Progress Notes (Signed)
Increased basal insulin from 7 to 25 units.  Sent new Rx to her pharmacy.  Attempted to call patient but she did not answer.  Her voicemail was full.  His son's phone does not work.  Called and updated her sister, Isaac Bliss.  She agreed to update patient.  She appreciated the call as well

## 2021-09-10 LAB — CULTURE, BLOOD (ROUTINE X 2): Culture: NO GROWTH

## 2021-09-21 ENCOUNTER — Encounter: Payer: Self-pay | Admitting: Internal Medicine

## 2021-09-21 ENCOUNTER — Other Ambulatory Visit: Payer: Medicare Other | Admitting: Internal Medicine

## 2021-09-21 DIAGNOSIS — E1122 Type 2 diabetes mellitus with diabetic chronic kidney disease: Secondary | ICD-10-CM | POA: Diagnosis not present

## 2021-09-21 DIAGNOSIS — R052 Subacute cough: Secondary | ICD-10-CM

## 2021-09-21 DIAGNOSIS — N1832 Chronic kidney disease, stage 3b: Secondary | ICD-10-CM

## 2021-09-21 DIAGNOSIS — Z515 Encounter for palliative care: Secondary | ICD-10-CM

## 2021-09-21 NOTE — Progress Notes (Signed)
Branch Visit Telephone: [phone]  Fax: [phone]   Date of encounter: 09/21/21 10:02 AM PATIENT NAME: [name] [name] [address] [place] [address]   [phone] (home)  DOB: 03-07-1955 MRN: [id number] PRIMARY CARE PROVIDER:    Clinic, [place],  [place] [address] [phone]  REFERRING PROVIDER:   [facility]  RESPONSIBLE PARTY:    Contact Information     Name Relation Home Work Mobile   [place] Son [phone]  [phone]   [place] Areas [phone]  [phone]   [place] Sister [phone]  [phone]        I met face to face with patient and family in her home with two caregivers present. Palliative Care was asked to follow this patient by consultation request of  [facility] to address advance care planning and complex medical decision making. This is follow-up visit.                                     ASSESSMENT AND PLAN / RECOMMENDATIONS:   Advance Care Planning/Goals of Care: Goals include to maximize quality of life and symptom management. Patient/health care surrogate gave his/her permission to discuss.Our advance care planning conversation included a discussion about:    The value and importance of advance care planning  Experiences with loved ones who have been seriously ill or have died  Exploration of personal, cultural or spiritual beliefs that might influence medical decisions  Exploration of goals of care in the event of a sudden injury or illness  Identification  of a healthcare agent  Review and updating or creation of an  advance directive document . Decision not to resuscitate or to de-escalate disease focused treatments due to poor prognosis. CODE STATUS:  full code, not ready to give up though she is not making good choices, educated her about importance of  healthy diet, more frequent small meals, better choices about liquids  Symptom Management/Plan: 1. Subacute cough -completes her cephalosporin for her UTI today, has fairly dry upper airway cough with some wheezing, encouraged regular use of her albuterol q6h while awake, hydration with water and taking measures to get her glucose under control to prevent recurrent infections -advised to call PCP or [place] in the event she develops fever, discolored sputum, sob, other decline in terms of her respiratory status -given she just had empiric abx at the hospital and completing a course, I opted not to give her more, she is on a probiotic  2. Type 2 diabetes mellitus with stage 3b chronic kidney disease, without long-term current use of insulin (HCC) --continue current insulin and ozempic regimen (updated to what she reports she's actually using in epic chart) - insulin glargine-yfgn (SEMGLEE) 100 UNIT/ML Pen; Inject 52 Units into the skin daily. And 7 units at hs  Dispense: 15 mL; Refill: 1 -hba1c 15.1 during hospitalization and CBGs had been running high during last visit--likes to drink sweet beverages and eats one meal per day which is usually fairly low carb, then snacks on sweet items also -this am, CBG was in 400s after she drank a gatorade overnight b/c that's what was on the bedside table; then after CBG was that high, she insisted caregiver provide her with a frozen almond joy that she ate part of even though I urged her not to -discussed importance of more frequent  small meals, avoiding sweet beverages, increasing water instead or trying water with mio, avoiding candy bars except in hypoglycemia which does not seem to be an issue, recommended high protein snacks -maybe she would benefit from nutrition consult especially if this could be done virtually for her  3. Palliative care by specialist -goals are for aggressive care though she is not making good choices--reminded her of risk of readmission  and death from her dietary selections, risk of recurrent and unresolved infections and she says she needs to do better -continue to follow over time for added support  -need to check on whether she is back to using her CPAP machine with autopap as we did not get to this topic with focus on CBGs today  Follow up Palliative Care Visit: Palliative care will continue to follow for complex medical decision making, advance care planning, and clarification of goals. Return 9/28 at 10am    This visit was coded based on medical decision making (MDM).  PPS: 40%  HOSPICE ELIGIBILITY/DIAGNOSIS: TBD  Chief Complaint: Follow-up palliative visit  HISTORY OF PRESENT ILLNESS:  [name] [name] is a 66 y.o. year old female  with OSA to be on autopap, uncontrolled DMII with CKD, morbid obesity, bedbound since unrepaired femoral fx, htn, anxiety, depression, prior opiate overdose, chronic diastolic chf among others seen in palliative f/u in her condo--two am caregivers present.   She was in the hospital from 7/15-20 with klebsiella UTI causing sepsis and acute hypoxic respiratory failure.  Afterward, she developed a cough and she completes her abx for that today.    DMII was uncontrolled.  She also had volume overload and acute on chronic renal failure.  Hba1c was 15.1 when she was admitted.  She reports that the [place] doctors have her on semglee 52 in am, 7 in pm and her ozempic 32m each Thursday.  CBG this am was in the 400s.  Says they've been up and down.  Had been down in 200s at first when she came home, once 81, now the past two days they've been up in the 400s again.  She had gatorade at 5am this morning b/c that's what was next to her.  She ate an almond joy.    Last night, she had vegetarian beans and sausage.  Yesterday, she had a little watermelon, cantelope and a glucerna.   Day before, she had beef liver and onions. Sunday, she had [employer] ham, yam souffle and lima beans  Renal function cr had been  up to 3.8 and then it did trend down to 2.7.  baseline is 1.3.  [facility]    WBC trended down to 9.1 from 14.7.  History obtained from review of EMR, discussion with primary team, and interview with family, facility staff/caregiver and/or Ms. Magistro.  I reviewed available labs, medications, imaging, studies and related documents from the EMR.  Records reviewed and summarized above.   ROS Review of Systems  Constitutional:  Negative for activity change, appetite change, chills and fever.  HENT:  Negative for trouble swallowing.   Eyes:  Positive for visual disturbance.       Reports spots in vision of different colors--advised to see ophtho for annual retina exam as it's been over a year and likely retinopathy is cause in view of hyperglycemia  Respiratory:  Positive for apnea, cough and wheezing. Negative for chest tightness and shortness of breath.   Cardiovascular:  Negative for chest pain, palpitations and leg swelling.  Gastrointestinal:  Negative for abdominal pain.  Genitourinary:  Negative for dysuria and urgency.  Musculoskeletal:  Positive for arthralgias and gait problem.  Skin:  Negative for color change.  Neurological:  Positive for weakness. Negative for dizziness.  Psychiatric/Behavioral:  Positive for decreased concentration. Negative for confusion and sleep disturbance. The patient is nervous/anxious.     Physical Exam: There were no vitals filed for this visit. There is no height or weight on file to calculate BMI. Wt Readings from Last 500 Encounters:  09/09/21 282 lb 10.1 oz (128.2 kg)  07/20/21 290 lb (131.5 kg)  06/13/21 294 lb (133.4 kg)  05/12/21 290 lb (131.5 kg)  04/21/21 290 lb (131.5 kg)  02/01/21 299 lb (135.6 kg)  09/08/20 281 lb 15.5 oz (127.9 kg)  08/28/20 286 lb 6 oz (129.9 kg)  08/06/20 (!) 321 lb 3.4 oz (145.7 kg)  07/16/20 (!) 301 lb (136.5 kg)  02/11/20 (!) 314 lb 2.5 oz (142.5 kg)  11/23/19 (!) 396 lb (179.6 kg)   10/23/19 (!) 330 lb 11 oz (150 kg)  10/18/19 (!) 330 lb 11 oz (150 kg)  10/07/19 (!) 330 lb 11 oz (150 kg)  08/20/19 (!) 330 lb 0.5 oz (149.7 kg)  08/16/19 (!) 330 lb (149.7 kg)  05/06/19 (!) 350 lb (158.8 kg)  10/09/18 (!) 312 lb 12.8 oz (141.9 kg)  11/21/16 200 lb (90.7 kg)  02/08/16 200 lb (90.7 kg)  11/25/14 180 lb (81.6 kg)  11/21/14 180 lb (81.6 kg)  08/15/14 176 lb 6.4 oz (80 kg)  07/01/14 170 lb 8 oz (77.3 kg)  10/27/13 180 lb (81.6 kg)   Physical Exam Constitutional:      General: She is not in acute distress.    Appearance: She is obese.  HENT:     Head: Normocephalic and atraumatic.  Cardiovascular:     Rate and Rhythm: Normal rate and regular rhythm.  Pulmonary:     Effort: Pulmonary effort is normal.     Breath sounds: Wheezing present.     Comments: Scattered wheezes anteriorly, coarse upper airway breath sounds Abdominal:     General: Bowel sounds are normal.  Musculoskeletal:     Right lower leg: No edema.     Left lower leg: No edema.  Skin:    Comments: Dressing over excoriated area beneath left breast/upper abdomen region  Neurological:     Mental Status: She is alert and oriented to person, place, and time.  Psychiatric:        Mood and Affect: Mood normal.     CURRENT PROBLEM LIST:  Patient Active Problem List   Diagnosis Date Noted   Hypomagnesemia 09/08/2021   Severe sepsis ([place]) 09/07/2021   Bacteremia due to Klebsiella pneumoniae 09/07/2021   Urinary tract infection 09/07/2021   Acute respiratory failure with hypoxia and hypercapnia ([place]) 09/04/2021   DKA (diabetic ketoacidosis) ([place]) 02/02/2021   Class 3 obesity ([place]) 02/02/2021   Hyperglycemia 02/01/2021   Pancreatitis 09/07/2020   Intractable nausea and vomiting 09/07/2020   Functional quadriplegia ([place]) 09/07/2020   Physical debility 08/26/2020   OSA on CPAP 08/26/2020   Constipation    Acute renal failure superimposed on stage 3a chronic kidney disease, unspecified acute renal  failure type ([place]) 08/25/2020   Acute renal failure superimposed on stage 3b chronic kidney disease ([place]) 07/30/2020   Lower extremity cellulitis 02/04/2020   Acute on chronic diastolic CHF (congestive heart failure) ([place]) 11/23/2019   Vaginal discharge 11/06/2019   Morbid obesity ([place])  08/22/2019   Opiate overdose ([place]) 08/20/2019   Cellulitis 10/05/2018   Acute cystitis without hematuria 10/05/2018   Essential hypertension 10/05/2018   Pressure injury of skin 10/05/2018   Hypoglycemia secondary to sulfonylurea 11/21/2016   Substance induced mood disorder ([place]) 07/02/2014   Encephalopathy    Hypokalemia    Type 2 diabetes mellitus with stage 3 chronic kidney disease (HCC)    HLD (hyperlipidemia)    Anxiety and depression    Anxiety state     PAST MEDICAL HISTORY:  Active Ambulatory Problems    Diagnosis Date Noted   Encephalopathy    Hypokalemia    Type 2 diabetes mellitus with stage 3 chronic kidney disease (HCC)    HLD (hyperlipidemia)    Anxiety and depression    Anxiety state    Substance induced mood disorder ([place]) 07/02/2014   Hypoglycemia secondary to sulfonylurea 11/21/2016   Cellulitis 10/05/2018   Acute cystitis without hematuria 10/05/2018   Essential hypertension 10/05/2018   Pressure injury of skin 10/05/2018   Opiate overdose ([place]) 08/20/2019   Morbid obesity ([place]) 08/22/2019   Vaginal discharge 11/06/2019   Acute on chronic diastolic CHF (congestive heart failure) ([place]) 11/23/2019   Lower extremity cellulitis 02/04/2020   Acute renal failure superimposed on stage 3b chronic kidney disease ([place]) 07/30/2020   Acute renal failure superimposed on stage 3a chronic kidney disease, unspecified acute renal failure type ([place]) 08/25/2020   Physical debility 08/26/2020   OSA on CPAP 08/26/2020   Constipation    Pancreatitis 09/07/2020   Intractable nausea and vomiting 09/07/2020   Functional quadriplegia ([place]) 09/07/2020   Hyperglycemia 02/01/2021   DKA  (diabetic ketoacidosis) ([place]) 02/02/2021   Class 3 obesity ([place]) 02/02/2021   Acute respiratory failure with hypoxia and hypercapnia ([place]) 09/04/2021   Severe sepsis ([place]) 09/07/2021   Bacteremia due to Klebsiella pneumoniae 09/07/2021   Urinary tract infection 09/07/2021   Hypomagnesemia 09/08/2021   Resolved Ambulatory Problems    Diagnosis Date Noted   Hyperkalemia    Metabolic encephalopathy    GI bleeding 10/19/2019   Anemia associated with acute blood loss 11/11/2019   Hemorrhagic shock ([place]) 11/27/2019   Past Medical History:  Diagnosis Date   Diabetes mellitus without complication ([place])    Hypertension    Knee pain, chronic    Neuropathy in diabetes ([place])     SOCIAL HX:  Social History   Tobacco Use   Smoking status: Former   Smokeless tobacco: Never  Substance Use Topics   Alcohol use: Yes    Alcohol/week: 0.0 standard drinks of alcohol    Comment: socially     ALLERGIES:  Allergies  Allergen Reactions   Lisinopril Swelling   Morphine Itching and Rash   Penicillins Hives, Itching, Nausea And Vomiting and Rash    Has patient had a PCN reaction causing immediate rash, facial/tongue/throat swelling, SOB or lightheadedness with hypotension: No Has patient had a PCN reaction causing severe rash involving mucus membranes or skin necrosis: No Has patient had a PCN reaction that required hospitalization: No Has patient had a PCN reaction occurring within the last 10 years: No If all of the above answers are "NO", then may proceed with Cephalosporin use.       PERTINENT MEDICATIONS:  Outpatient Encounter Medications as of 09/21/2021  Medication Sig   acetaminophen (TYLENOL) 500 MG tablet Take 500 mg by mouth every 6 (six) hours as needed.   albuterol (VENTOLIN HFA) 108 (90 Base) MCG/ACT inhaler Inhale 2 puffs into  the lungs every 6 (six) hours as needed for wheezing or shortness of breath.   atorvastatin (LIPITOR) 10 MG tablet Take 10 mg by mouth at bedtime.    cetirizine (ZYRTEC) 10 MG tablet Take 10 mg by mouth daily.   Ensure (ENSURE) Take 237 mLs by mouth 2 (two) times daily between meals.   escitalopram (LEXAPRO) 10 MG tablet Take 1 tablet (10 mg total) by mouth every morning.   HYDROcodone-acetaminophen (NORCO/VICODIN) 5-325 MG tablet Take 1 tablet by mouth 3 (three) times daily as needed (pain).   hydrOXYzine (VISTARIL) 25 MG capsule Take 25 mg by mouth 3 (three) times daily as needed for itching or anxiety.   insulin glargine-yfgn (SEMGLEE) 100 UNIT/ML Pen Inject 25 Units into the skin daily.   Insulin Pen Needle 31G X 5 MM MISC Use as directed daily   ketotifen (ZADITOR) 0.025 % ophthalmic solution Place 1 drop into both eyes 2 (two) times daily as needed.   mirtazapine (REMERON) 15 MG tablet Take 15 mg by mouth at bedtime as needed (sleep).   nystatin (MYCOSTATIN/NYSTOP) powder Apply to affected area 2 (two) times daily to groin and areas of fungal infection   nystatin cream (MYCOSTATIN) Apply 1 Application topically 2 (two) times daily.   pantoprazole (PROTONIX) 40 MG tablet Take 40 mg by mouth daily before breakfast.   potassium chloride SA (KLOR-CON M) 20 MEQ tablet Take 1 tablet (20 mEq total) by mouth daily.   pregabalin (LYRICA) 75 MG capsule Take 75 mg by mouth 2 (two) times daily.   Semaglutide,0.25 or 0.5MG/DOS, 2 MG/1.5ML SOPN Inject 2 mg into the skin every Thursday.   torsemide (DEMADEX) 20 MG tablet Take 2 tablets (40 mg total) by mouth daily.   No facility-administered encounter medications on file as of 09/21/2021.    Thank you for the opportunity to participate in the care of Ms. Alred.  The palliative care team will continue to follow. Please call our office at [phone] if we can be of additional assistance.   Hollace Kinnier, DO  COVID-19 PATIENT SCREENING TOOL Asked and negative response unless otherwise noted:  Have you had symptoms of covid, tested positive or been in contact with someone with symptoms/positive test  in the past 5-10 days? No

## 2021-09-25 ENCOUNTER — Inpatient Hospital Stay (HOSPITAL_COMMUNITY)
Admission: EM | Admit: 2021-09-25 | Discharge: 2021-09-28 | DRG: 638 | Disposition: A | Discharge status: Home / Self Care | Payer: Medicare Other | Attending: Family Medicine | Admitting: Family Medicine

## 2021-09-25 ENCOUNTER — Emergency Department (HOSPITAL_COMMUNITY): Payer: Medicare Other

## 2021-09-25 ENCOUNTER — Other Ambulatory Visit: Payer: Self-pay

## 2021-09-25 DIAGNOSIS — Z87891 Personal history of nicotine dependence: Secondary | ICD-10-CM

## 2021-09-25 DIAGNOSIS — E1169 Type 2 diabetes mellitus with other specified complication: Secondary | ICD-10-CM | POA: Diagnosis present

## 2021-09-25 DIAGNOSIS — Z794 Long term (current) use of insulin: Secondary | ICD-10-CM | POA: Diagnosis not present

## 2021-09-25 DIAGNOSIS — N179 Acute kidney failure, unspecified: Secondary | ICD-10-CM | POA: Diagnosis not present

## 2021-09-25 DIAGNOSIS — I5032 Chronic diastolic (congestive) heart failure: Secondary | ICD-10-CM | POA: Diagnosis not present

## 2021-09-25 DIAGNOSIS — N1832 Chronic kidney disease, stage 3b: Secondary | ICD-10-CM | POA: Diagnosis present

## 2021-09-25 DIAGNOSIS — R739 Hyperglycemia, unspecified: Secondary | ICD-10-CM | POA: Diagnosis not present

## 2021-09-25 DIAGNOSIS — R519 Headache, unspecified: Secondary | ICD-10-CM | POA: Diagnosis not present

## 2021-09-25 DIAGNOSIS — I130 Hypertensive heart and chronic kidney disease with heart failure and stage 1 through stage 4 chronic kidney disease, or unspecified chronic kidney disease: Secondary | ICD-10-CM | POA: Diagnosis not present

## 2021-09-25 DIAGNOSIS — Z885 Allergy status to narcotic agent status: Secondary | ICD-10-CM

## 2021-09-25 DIAGNOSIS — E875 Hyperkalemia: Secondary | ICD-10-CM | POA: Diagnosis not present

## 2021-09-25 DIAGNOSIS — E1140 Type 2 diabetes mellitus with diabetic neuropathy, unspecified: Secondary | ICD-10-CM | POA: Diagnosis present

## 2021-09-25 DIAGNOSIS — I5030 Unspecified diastolic (congestive) heart failure: Secondary | ICD-10-CM | POA: Diagnosis present

## 2021-09-25 DIAGNOSIS — Z7401 Bed confinement status: Secondary | ICD-10-CM

## 2021-09-25 DIAGNOSIS — E1122 Type 2 diabetes mellitus with diabetic chronic kidney disease: Secondary | ICD-10-CM | POA: Diagnosis present

## 2021-09-25 DIAGNOSIS — E1165 Type 2 diabetes mellitus with hyperglycemia: Secondary | ICD-10-CM | POA: Diagnosis not present

## 2021-09-25 DIAGNOSIS — Z79899 Other long term (current) drug therapy: Secondary | ICD-10-CM | POA: Diagnosis not present

## 2021-09-25 DIAGNOSIS — Z7985 Long-term (current) use of injectable non-insulin antidiabetic drugs: Secondary | ICD-10-CM

## 2021-09-25 DIAGNOSIS — Z880 Allergy status to penicillin: Secondary | ICD-10-CM

## 2021-09-25 DIAGNOSIS — Z833 Family history of diabetes mellitus: Secondary | ICD-10-CM

## 2021-09-25 DIAGNOSIS — R0602 Shortness of breath: Secondary | ICD-10-CM | POA: Diagnosis not present

## 2021-09-25 DIAGNOSIS — F32A Depression, unspecified: Secondary | ICD-10-CM | POA: Diagnosis present

## 2021-09-25 DIAGNOSIS — M542 Cervicalgia: Secondary | ICD-10-CM | POA: Diagnosis not present

## 2021-09-25 DIAGNOSIS — G4733 Obstructive sleep apnea (adult) (pediatric): Secondary | ICD-10-CM

## 2021-09-25 DIAGNOSIS — R6889 Other general symptoms and signs: Secondary | ICD-10-CM | POA: Diagnosis not present

## 2021-09-25 DIAGNOSIS — E785 Hyperlipidemia, unspecified: Secondary | ICD-10-CM | POA: Diagnosis not present

## 2021-09-25 DIAGNOSIS — Z6841 Body Mass Index (BMI) 40.0 and over, adult: Secondary | ICD-10-CM | POA: Diagnosis not present

## 2021-09-25 DIAGNOSIS — Z743 Need for continuous supervision: Secondary | ICD-10-CM | POA: Diagnosis not present

## 2021-09-25 DIAGNOSIS — Z888 Allergy status to other drugs, medicaments and biological substances status: Secondary | ICD-10-CM | POA: Diagnosis not present

## 2021-09-25 DIAGNOSIS — F419 Anxiety disorder, unspecified: Secondary | ICD-10-CM | POA: Diagnosis present

## 2021-09-25 DIAGNOSIS — I10 Essential (primary) hypertension: Secondary | ICD-10-CM | POA: Diagnosis not present

## 2021-09-25 DIAGNOSIS — Z602 Problems related to living alone: Secondary | ICD-10-CM | POA: Diagnosis present

## 2021-09-25 LAB — COMPREHENSIVE METABOLIC PANEL
ALT: 18 U/L (ref 0–44)
AST: 21 U/L (ref 15–41)
Albumin: 3.8 g/dL (ref 3.5–5.0)
Alkaline Phosphatase: 162 U/L — ABNORMAL HIGH (ref 38–126)
Anion gap: 13 (ref 5–15)
BUN: 43 mg/dL — ABNORMAL HIGH (ref 8–23)
CO2: 26 mmol/L (ref 22–32)
Calcium: 9.6 mg/dL (ref 8.9–10.3)
Chloride: 91 mmol/L — ABNORMAL LOW (ref 98–111)
Creatinine, Ser: 2.21 mg/dL — ABNORMAL HIGH (ref 0.44–1.00)
GFR, Estimated: 24 mL/min — ABNORMAL LOW (ref >60)
Glucose, Bld: 646 mg/dL (ref 70–99)
Potassium: 5.8 mmol/L — ABNORMAL HIGH (ref 3.5–5.1)
Sodium: 130 mmol/L — ABNORMAL LOW (ref 135–145)
Total Bilirubin: 0.7 mg/dL (ref 0.3–1.2)
Total Protein: 8.7 g/dL — ABNORMAL HIGH (ref 6.5–8.1)

## 2021-09-25 LAB — CBC WITH DIFFERENTIAL/PLATELET
Abs Immature Granulocytes: 0.01 10*3/uL (ref 0.00–0.07)
Basophils Absolute: 0 10*3/uL (ref 0.0–0.1)
Basophils Relative: 0 %
Eosinophils Absolute: 0.1 10*3/uL (ref 0.0–0.5)
Eosinophils Relative: 1 %
HCT: 47.2 % — ABNORMAL HIGH (ref 36.0–46.0)
Hemoglobin: 14.5 g/dL (ref 12.0–15.0)
Immature Granulocytes: 0 %
Lymphocytes Relative: 19 %
Lymphs Abs: 1.1 10*3/uL (ref 0.7–4.0)
MCH: 25.5 pg — ABNORMAL LOW (ref 26.0–34.0)
MCHC: 30.7 g/dL (ref 30.0–36.0)
MCV: 83 fL (ref 80.0–100.0)
Monocytes Absolute: 0.5 10*3/uL (ref 0.1–1.0)
Monocytes Relative: 8 %
Neutro Abs: 4.2 10*3/uL (ref 1.7–7.7)
Neutrophils Relative %: 72 %
Platelets: 328 10*3/uL (ref 150–400)
RBC: 5.69 MIL/uL — ABNORMAL HIGH (ref 3.87–5.11)
RDW: 15.2 % (ref 11.5–15.5)
WBC: 5.9 10*3/uL (ref 4.0–10.5)
nRBC: 0 % (ref 0.0–0.2)

## 2021-09-25 LAB — I-STAT VENOUS BLOOD GAS, ED
Acid-Base Excess: 3 mmol/L — ABNORMAL HIGH (ref 0.0–2.0)
Bicarbonate: 29 mmol/L — ABNORMAL HIGH (ref 20.0–28.0)
Calcium, Ion: 1.01 mmol/L — ABNORMAL LOW (ref 1.15–1.40)
HCT: 50 % — ABNORMAL HIGH (ref 36.0–46.0)
Hemoglobin: 17 g/dL — ABNORMAL HIGH (ref 12.0–15.0)
O2 Saturation: 95 %
Potassium: 5.6 mmol/L — ABNORMAL HIGH (ref 3.5–5.1)
Sodium: 129 mmol/L — ABNORMAL LOW (ref 135–145)
TCO2: 30 mmol/L (ref 22–32)
pCO2, Ven: 47.5 mmHg (ref 44–60)
pH, Ven: 7.394 (ref 7.25–7.43)
pO2, Ven: 76 mmHg — ABNORMAL HIGH (ref 32–45)

## 2021-09-25 LAB — CBG MONITORING, ED
Glucose-Capillary: 578 mg/dL (ref 70–99)
Glucose-Capillary: >600 mg/dL (ref 70–99)

## 2021-09-25 MED ORDER — HYDROXYZINE HCL 25 MG PO TABS
25.0000 mg | ORAL_TABLET | Freq: Three times a day (TID) | ORAL | Status: DC | PRN
  Administered 2021-09-27: 25 mg via ORAL
  Filled 2021-09-25: qty 1

## 2021-09-25 MED ORDER — ALBUTEROL SULFATE (2.5 MG/3ML) 0.083% IN NEBU: 2.5000 mg | INHALATION_SOLUTION | Freq: Four times a day (QID) | RESPIRATORY_TRACT | Status: DC | PRN

## 2021-09-25 MED ORDER — INSULIN ASPART 100 UNIT/ML IJ SOLN
10.0000 [IU] | Freq: Once | INTRAMUSCULAR | Status: AC
  Administered 2021-09-25: 10 [IU] via SUBCUTANEOUS

## 2021-09-25 MED ORDER — DEXTROSE 50 % IV SOLN: 0.0000 mL | INTRAVENOUS | Status: DC | PRN

## 2021-09-25 MED ORDER — INSULIN REGULAR(HUMAN) IN NACL 100-0.9 UT/100ML-% IV SOLN
INTRAVENOUS | Status: DC
  Administered 2021-09-25: 11.5 [IU]/h via INTRAVENOUS
  Administered 2021-09-26: 10.5 [IU]/h via INTRAVENOUS
  Filled 2021-09-25 (×2): qty 100

## 2021-09-25 MED ORDER — KETOROLAC TROMETHAMINE 30 MG/ML IJ SOLN
30.0000 mg | Freq: Once | INTRAMUSCULAR | Status: AC
  Administered 2021-09-25: 30 mg via INTRAVENOUS
  Filled 2021-09-25: qty 1

## 2021-09-25 MED ORDER — DEXTROSE IN LACTATED RINGERS 5 % IV SOLN: INTRAVENOUS | Status: DC

## 2021-09-25 MED ORDER — SENNOSIDES-DOCUSATE SODIUM 8.6-50 MG PO TABS
1.0000 | ORAL_TABLET | Freq: Every evening | ORAL | Status: DC | PRN
Start: 2021-09-25 — End: 2021-09-28

## 2021-09-25 MED ORDER — METOCLOPRAMIDE HCL 5 MG/ML IJ SOLN
10.0000 mg | Freq: Once | INTRAMUSCULAR | Status: AC
  Administered 2021-09-25: 10 mg via INTRAVENOUS
  Filled 2021-09-25: qty 2

## 2021-09-25 MED ORDER — HYDROXYZINE PAMOATE 25 MG PO CAPS: 25.0000 mg | ORAL_CAPSULE | Freq: Three times a day (TID) | ORAL | Status: DC | PRN

## 2021-09-25 MED ORDER — HEPARIN SODIUM (PORCINE) 5000 UNIT/ML IJ SOLN
5000.0000 [IU] | Freq: Three times a day (TID) | INTRAMUSCULAR | Status: DC
  Administered 2021-09-26 – 2021-09-28 (×8): 5000 [IU] via SUBCUTANEOUS
  Filled 2021-09-25 (×8): qty 1

## 2021-09-25 MED ORDER — ATORVASTATIN CALCIUM 10 MG PO TABS
10.0000 mg | ORAL_TABLET | Freq: Every day | ORAL | Status: DC
  Administered 2021-09-26 – 2021-09-27 (×2): 10 mg via ORAL
  Filled 2021-09-25 (×2): qty 1

## 2021-09-25 MED ORDER — ESCITALOPRAM OXALATE 10 MG PO TABS
10.0000 mg | ORAL_TABLET | Freq: Every morning | ORAL | Status: DC
  Administered 2021-09-26 – 2021-09-28 (×3): 10 mg via ORAL
  Filled 2021-09-25 (×3): qty 1

## 2021-09-25 MED ORDER — SODIUM CHLORIDE 0.9 % IV BOLUS
500.0000 mL | Freq: Once | INTRAVENOUS | Status: AC
Start: 2021-09-25 — End: 2021-09-25
  Administered 2021-09-25: 500 mL via INTRAVENOUS

## 2021-09-25 MED ORDER — ONDANSETRON HCL 4 MG/2ML IJ SOLN: 4.0000 mg | Freq: Four times a day (QID) | INTRAMUSCULAR | Status: DC | PRN

## 2021-09-25 MED ORDER — PANTOPRAZOLE SODIUM 40 MG PO TBEC
40.0000 mg | DELAYED_RELEASE_TABLET | Freq: Every day | ORAL | Status: DC
  Administered 2021-09-26 – 2021-09-28 (×3): 40 mg via ORAL
  Filled 2021-09-25 (×3): qty 1

## 2021-09-25 MED ORDER — LACTATED RINGERS IV SOLN: INTRAVENOUS | Status: DC

## 2021-09-25 MED ORDER — DIPHENHYDRAMINE HCL 50 MG/ML IJ SOLN
25.0000 mg | Freq: Once | INTRAMUSCULAR | Status: AC
  Administered 2021-09-25: 25 mg via INTRAVENOUS
  Filled 2021-09-25: qty 1

## 2021-09-25 NOTE — ED Triage Notes (Signed)
Pt BIB EMS. Pt coming in c/o a headache that suddenly worsened today. EMS noted that pt's blood sugar when checked was 586. Pt does have 2L of O2 PRN, but was 95% with EMS the entire route.   VSS w/ EMS.

## 2021-09-25 NOTE — Assessment & Plan Note (Signed)
Last EF 60-65% with G1 DD on TTE 09/05/2021.  Body habitus limits volume assessment however overall appears slightly volume depleted in setting of severe hyperglycemia. -Hold home torsemide -Monitor strict I/O's and daily weights

## 2021-09-25 NOTE — Assessment & Plan Note (Signed)
Continue CPAP nightly. °

## 2021-09-25 NOTE — Assessment & Plan Note (Signed)
Potassium 5.8 on admission.  Anticipate improvement with IV insulin.

## 2021-09-25 NOTE — Assessment & Plan Note (Addendum)
Admitted with severe hyperglycemia without evidence of DKA/HHS.  Likely due to significant dietary indiscretion.  Reports adherence to home insulin use. -Started on insulin infusion -Decrease IV LR fluid rate to 75 mL/hour given history of HFpEF -Transition to D5 in LR when CBG <250 -Keep n.p.o. except noncaloric liquids -Repeat BMP in a.m.

## 2021-09-25 NOTE — ED Provider Notes (Signed)
[facility] EMERGENCY DEPARTMENT Provider Note   CSN: [id number] Arrival date & time: 09/25/21  1849     History  Chief Complaint  Patient presents with   Hyperglycemia    SANTANNA WHITFORD is a 66 y.o. female.  HPI Patient presenting from home by ambulance for evaluation of hyperglycemia.  She reports taking her usual medicines and trying to be careful about what she eats.  She developed a right-sided headache today that is persistent despite taking Tylenol 3 times.  She denies fever, cough, focal weakness or paresthesia.  She remains bedbound at home.    Home Medications Prior to Admission medications   Medication Sig Start Date End Date Taking? Authorizing Provider  acetaminophen (TYLENOL) 500 MG tablet Take 500 mg by mouth every 6 (six) hours as needed. 07/24/20  Yes [provider]  albuterol (VENTOLIN HFA) 108 (90 Base) MCG/ACT inhaler Inhale 2 puffs into the lungs every 6 (six) hours as needed for wheezing or shortness of breath. 08/06/20  Yes Aline August, MD  atorvastatin (LIPITOR) 10 MG tablet Take 10 mg by mouth at bedtime.   Yes [provider]  cetirizine (ZYRTEC) 10 MG tablet Take 10 mg by mouth daily. 11/20/20  Yes [provider]  escitalopram (LEXAPRO) 10 MG tablet Take 1 tablet (10 mg total) by mouth every morning. 08/01/21  Yes Reed, Tiffany L, DO  insulin glargine-yfgn (SEMGLEE) 100 UNIT/ML Pen Inject 7-52 Units into the skin See admin instructions.  52 units in the morning and 7 units at bedtime 09/21/21  Yes Reed, Tiffany L, DO  hydrOXYzine (VISTARIL) 25 MG capsule Take 25 mg by mouth 3 (three) times daily as needed for itching or anxiety. Patient not taking: Reported on 09/26/2021 10/30/20   [provider]  Insulin Pen Needle 31G X 5 MM MISC Use as directed daily 02/03/21   Dessa Phi, DO  ketotifen (ZADITOR) 0.025 % ophthalmic solution Place 1 drop into both eyes 2 (two) times daily as needed. Patient taking  differently: Place 1 drop into both eyes 2 (two) times daily as needed (allergies, itching). 09/09/21   Mercy Riding, MD  mirtazapine (REMERON) 15 MG tablet Take 15 mg by mouth at bedtime as needed (sleep). 11/06/20   [provider]  nystatin (MYCOSTATIN/NYSTOP) powder Apply to affected area 2 (two) times daily to groin and areas of fungal infection 02/03/21   Dessa Phi, DO  nystatin cream (MYCOSTATIN) Apply 1 Application topically 2 (two) times daily. 05/10/21   [provider]  pantoprazole (PROTONIX) 40 MG tablet Take 40 mg by mouth daily before breakfast.    [provider]  potassium chloride SA (KLOR-CON M) 20 MEQ tablet Take 1 tablet (20 mEq total) by mouth daily. 09/09/21   Mercy Riding, MD  pregabalin (LYRICA) 75 MG capsule Take 75 mg by mouth 2 (two) times daily.    [provider]  Semaglutide,0.25 or 0.'5MG'$ /DOS, 2 MG/1.5ML SOPN Inject 2 mg into the skin every Thursday. 02/24/21   [provider]  torsemide (DEMADEX) 20 MG tablet Take 2 tablets (40 mg total) by mouth daily. 09/09/21   Mercy Riding, MD      Allergies    Lisinopril, Morphine, and Penicillins    Review of Systems   Review of Systems  Physical Exam Updated Vital Signs BP (!) 121/104   Pulse (!) 114   Temp 97.9 F (36.6 C)   Resp 14   SpO2 93%  Physical Exam Vitals  and nursing note reviewed.  Constitutional:      General: She is not in acute distress.    Appearance: She is well-developed. She is obese. She is not ill-appearing or toxic-appearing.  HENT:     Head: Normocephalic and atraumatic.     Right Ear: External ear normal.     Left Ear: External ear normal.     Nose: No congestion or rhinorrhea.     Mouth/Throat:     Mouth: Mucous membranes are moist.  Eyes:     Conjunctiva/sclera: Conjunctivae normal.     Pupils: Pupils are equal, round, and reactive to light.  Neck:     Trachea: Phonation normal.  Cardiovascular:     Rate and Rhythm: Normal rate and  regular rhythm.     Heart sounds: Normal heart sounds.  Pulmonary:     Effort: Pulmonary effort is normal. No respiratory distress.     Breath sounds: Normal breath sounds. No stridor.  Abdominal:     General: There is no distension.     Palpations: Abdomen is soft.     Tenderness: There is no abdominal tenderness.  Musculoskeletal:        General: Normal range of motion.     Cervical back: Normal range of motion and neck supple.  Skin:    General: Skin is warm and dry.  Neurological:     Mental Status: She is alert and oriented to person, place, and time.     Cranial Nerves: No cranial nerve deficit.     Sensory: No sensory deficit.     Motor: No abnormal muscle tone.     Coordination: Coordination normal.  Psychiatric:        Mood and Affect: Mood normal.        Behavior: Behavior normal.        Thought Content: Thought content normal.        Judgment: Judgment normal.     ED Results / Procedures / Treatments   Labs (all labs ordered are listed, but only abnormal results are displayed) Labs Reviewed  COMPREHENSIVE METABOLIC PANEL - Abnormal; Notable for the following components:      Result Value   Sodium 130 (*)    Potassium 5.8 (*)    Chloride 91 (*)    Glucose, Bld 646 (*)    BUN 43 (*)    Creatinine, Ser 2.21 (*)    Total Protein 8.7 (*)    Alkaline Phosphatase 162 (*)    GFR, Estimated 24 (*)    All other components within normal limits  CBC WITH DIFFERENTIAL/PLATELET - Abnormal; Notable for the following components:   RBC 5.69 (*)    HCT 47.2 (*)    MCH 25.5 (*)    All other components within normal limits  BASIC METABOLIC PANEL - Abnormal; Notable for the following components:   Sodium 132 (*)    Chloride 94 (*)    CO2 21 (*)    Glucose, Bld 397 (*)    BUN 45 (*)    Creatinine, Ser 2.51 (*)    GFR, Estimated 20 (*)    Anion gap 17 (*)    All other components within normal limits  CBC - Abnormal; Notable for the following components:   RBC 5.79 (*)     HCT 47.7 (*)    MCH 25.2 (*)    All other components within normal limits  GLUCOSE, CAPILLARY - Abnormal; Notable for the following components:   Glucose-Capillary 492 (*)  All other components within normal limits  GLUCOSE, CAPILLARY - Abnormal; Notable for the following components:   Glucose-Capillary 416 (*)    All other components within normal limits  GLUCOSE, CAPILLARY - Abnormal; Notable for the following components:   Glucose-Capillary 370 (*)    All other components within normal limits  GLUCOSE, CAPILLARY - Abnormal; Notable for the following components:   Glucose-Capillary 283 (*)    All other components within normal limits  GLUCOSE, CAPILLARY - Abnormal; Notable for the following components:   Glucose-Capillary 206 (*)    All other components within normal limits  GLUCOSE, CAPILLARY - Abnormal; Notable for the following components:   Glucose-Capillary 194 (*)    All other components within normal limits  GLUCOSE, CAPILLARY - Abnormal; Notable for the following components:   Glucose-Capillary 202 (*)    All other components within normal limits  GLUCOSE, CAPILLARY - Abnormal; Notable for the following components:   Glucose-Capillary 233 (*)    All other components within normal limits  CBG MONITORING, ED - Abnormal; Notable for the following components:   Glucose-Capillary 578 (*)    All other components within normal limits  I-STAT VENOUS BLOOD GAS, ED - Abnormal; Notable for the following components:   pO2, Ven 76 (*)    Bicarbonate 29.0 (*)    Acid-Base Excess 3.0 (*)    Sodium 129 (*)    Potassium 5.6 (*)    Calcium, Ion 1.01 (*)    HCT 50.0 (*)    Hemoglobin 17.0 (*)    All other components within normal limits  CBG MONITORING, ED - Abnormal; Notable for the following components:   Glucose-Capillary >600 (*)    All other components within normal limits  CBG MONITORING, ED - Abnormal; Notable for the following components:   Glucose-Capillary 566 (*)     All other components within normal limits  URINALYSIS, ROUTINE W REFLEX MICROSCOPIC    EKG EKG Interpretation  Date/Time:  Saturday September 25 2021 19:03:08 EDT Ventricular Rate:  90 PR Interval:  140 QRS Duration: 77 QT Interval:  356 QTC Calculation: 436 R Axis:   64 Text Interpretation: Sinus rhythm Low voltage, precordial leads Baseline wander in lead(s) I III aVL V3 since last tracing no significant change Confirmed by Daleen Bo [phone]) on 09/25/2021 7:10:23 PM  Radiology DG Chest Port 1 View  Result Date: 09/25/2021 CLINICAL DATA:  Headache, hyperglycemia, shortness of breath EXAM: PORTABLE CHEST 1 VIEW COMPARISON:  09/06/2021 FINDINGS: Single frontal view of the chest demonstrates stable enlargement of the cardiac silhouette. Lung volumes are diminished with crowding of the central vasculature. No acute airspace disease, effusion, or pneumothorax. No acute bony abnormality. IMPRESSION: 1. Low lung volumes.  No acute process. Electronically Signed   By: Randa Ngo M.D.   On: 09/25/2021 23:11   CT Cervical Spine Wo Contrast  Result Date: 09/25/2021 CLINICAL DATA:  Neck pain, chronic, degenerative changes on xray EXAM: CT CERVICAL SPINE WITHOUT CONTRAST TECHNIQUE: Multidetector CT imaging of the cervical spine was performed without intravenous contrast. Multiplanar CT image reconstructions were also generated. RADIATION DOSE REDUCTION: This exam was performed according to the departmental dose-optimization program which includes automated exposure control, adjustment of the mA and/or kV according to patient size and/or use of iterative reconstruction technique. COMPARISON:  CT cervical spine 08/20/2019. No intervening radiographs. FINDINGS: Detailed assessment is limited by soft tissue attenuation from habitus creating noisy images. Alignment: Straightening of normal lordosis.  No listhesis. Skull base and vertebrae: No acute  fracture. No primary bone lesion or focal pathologic  process. Soft tissues and spinal canal: No prevertebral fluid or swelling. No visible canal hematoma. Disc levels: Mild diffuse disc space narrowing and anterior spurring. Mild multilevel facet hypertrophy. No high-grade canal stenosis. Upper chest: Nonacute. Other: Retropharyngeal course of the carotids with carotid atheromatous plaque. IMPRESSION: 1. Mild multilevel degenerative disc disease and facet hypertrophy. 2. No acute osseous findings. 3. No spinal canal stenosis. Electronically Signed   By: Keith Rake M.D.   On: 09/25/2021 22:14   CT HEAD WO CONTRAST (5MM)  Result Date: 09/25/2021 CLINICAL DATA:  Headache, chronic, new features or increased frequency EXAM: CT HEAD WITHOUT CONTRAST TECHNIQUE: Contiguous axial images were obtained from the base of the skull through the vertex without intravenous contrast. RADIATION DOSE REDUCTION: This exam was performed according to the departmental dose-optimization program which includes automated exposure control, adjustment of the mA and/or kV according to patient size and/or use of iterative reconstruction technique. COMPARISON:  Head CT 09/08/2020 FINDINGS: Brain: No intracranial hemorrhage, mass effect, or midline shift. Enlarged empty sella. No hydrocephalus. The basilar cisterns are patent. No evidence of territorial infarct or acute ischemia. No extra-axial or intracranial fluid collection. Vascular: Atherosclerosis of skullbase vasculature without hyperdense vessel or abnormal calcification. Skull: No fracture or focal lesion. Sinuses/Orbits: Mucosal thickening and debris in the left frontal sinus as well as scattered throughout ethmoid air cells and the bilateral sphenoid sinuses. Chronic opacification of left mastoid air cells, although progressed from prior exam. Other: None. IMPRESSION: 1. No acute intracranial abnormality. 2. Enlarged empty sella, typically incidental but can be seen with idiopathic intracranial hypertension. 3. Paranasal sinus  mucosal thickening and debris, query acute sinusitis. 4. Chronic left mastoid air cell opacification, although progressed from prior exam. Electronically Signed   By: Keith Rake M.D.   On: 09/25/2021 22:09    Procedures Procedures    Medications Ordered in ED Medications  insulin regular, human (MYXREDLIN) 100 units/ 100 mL infusion (10.5 Units/hr Intravenous New Bag/Given 09/26/21 0917)  dextrose 50 % solution 0-50 mL (has no administration in time range)  dextrose 5 % in lactated ringers infusion ( Intravenous New Bag/Given 09/26/21 0458)  lactated ringers infusion ( Intravenous New Bag/Given 09/26/21 0124)  heparin injection 5,000 Units (5,000 Units Subcutaneous Given 09/26/21 0621)  ondansetron (ZOFRAN) injection 4 mg (has no administration in time range)  senna-docusate (Senokot-S) tablet 1 tablet (has no administration in time range)  albuterol (PROVENTIL) (2.5 MG/3ML) 0.083% nebulizer solution 2.5 mg (has no administration in time range)  atorvastatin (LIPITOR) tablet 10 mg (has no administration in time range)  escitalopram (LEXAPRO) tablet 10 mg (10 mg Oral Given 09/26/21 0826)  pantoprazole (PROTONIX) EC tablet 40 mg (40 mg Oral Given 09/26/21 0826)  hydrOXYzine (ATARAX) tablet 25 mg (has no administration in time range)  acetaminophen (TYLENOL) tablet 650 mg (has no administration in time range)  insulin glargine-yfgn (SEMGLEE) injection 34 Units (34 Units Subcutaneous Given 09/26/21 0605)  insulin aspart (novoLOG) injection 0-5 Units (has no administration in time range)  insulin aspart (novoLOG) injection 4 Units (0 Units Subcutaneous Hold 09/26/21 0935)  insulin aspart (novoLOG) injection 0-15 Units (0 Units Subcutaneous Hold 09/26/21 0934)  insulin glargine-yfgn (SEMGLEE) injection 40 Units (has no administration in time range)  sodium chloride 0.9 % bolus 500 mL (500 mLs Intravenous New Bag/Given 09/25/21 2219)  ketorolac (TORADOL) 30 MG/ML injection 30 mg (30 mg Intravenous Given 09/25/21  2127)  insulin aspart (novoLOG) injection 10 Units (10 Units  Subcutaneous Given 09/25/21 2139)  metoCLOPramide (REGLAN) injection 10 mg (10 mg Intravenous Given 09/25/21 2127)  diphenhydrAMINE (BENADRYL) injection 25 mg (25 mg Intravenous Given 09/25/21 2126)    ED Course/ Medical Decision Making/ A&P                           Medical Decision Making Patient presents for evaluation of hyperglycemia at home and headache.  She is chronically bedbound, receiving palliative services at home.  She has been hyperglycemic recently and having difficulty controlling it despite taking medicines and attempting to stay on a low carbohydrate diet.  Problems Addressed: Hyperglycemia: acute illness or injury  Amount and/or Complexity of Data Reviewed External Data Reviewed: notes.    Details: Palliative care home visit from 09/21/2021 reviewed.  She is receiving care and treatment for obstructive sleep apnea, bedbound status with unrepaired femoral fracture, hypertension, anxiety, depression, chronic opiate abuse heart failure.  Most recent hospitalization, several weeks ago with UTI sepsis and acute hypoxic respiratory failure.  Also being managed for uncontrolled diabetes.  She has ongoing renal insufficiency above baseline.  Recent weights have been stable approximately 130 kg. Labs: ordered.    Details: CBC, Bmet, VBG, CBG -- initial CBG high,  Risk Prescription drug management. Decision regarding hospitalization. Risk Details: Chronically ill patient presenting with hyperglycemia.  Currently palliative status at home due to multiple comorbidities and recently been managed for uncontrolled diabetes.  She is presenting with high blood sugar despite taking usual medicines.  Venous access and laboratory testing difficult to obtain initially, care management transferred to Dr. Darl Householder to determine medical needs and disposition after laboratory testing obtained.           Final Clinical Impression(s) / ED  Diagnoses Final diagnoses:  Hyperglycemia  Hyperkalemia    Rx / DC Orders ED Discharge Orders     None         Daleen Bo, MD 09/26/21 [phone]

## 2021-09-25 NOTE — Assessment & Plan Note (Signed)
Continue atorvastatin

## 2021-09-25 NOTE — Assessment & Plan Note (Signed)
Continue Lexapro and hydroxyzine as needed.
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
## 2021-09-25 NOTE — Hospital Course (Addendum)
[name] [name] is a 66 y.o. female with medical history significant for HFpEF (EF 60-65% by TTE 09/05/2021), insulin-dependent T2DM, CKD stage IIIb, HTN, HLD, duodenal ulcer, OSA/OHS on CPAP, morbid obesity, nonambulatory bedbound status using Hoyer lift since unrepaired femoral fracture, depression/anxiety who was admitted with severe hyperglycemia in setting of insulin-dependent type 2 diabetes.

## 2021-09-25 NOTE — Assessment & Plan Note (Signed)
Creatinine 2.21 on admission.  Previously 1.64 when discharged on last admission after IV diuresis.  We will continue with gentle IV fluid hydration as above.  Monitor strict I/O's, UOP, renal function.  May be progressing towards CKD stage IV.

## 2021-09-25 NOTE — ED Provider Notes (Signed)
  Physical Exam  BP (!) 155/62   Pulse 100   Temp 97.6 F (36.4 C) (Oral)   Resp 20   SpO2 92%   Physical Exam  Procedures  Procedures  ED Course / MDM    Medical Decision Making  Care assumed at oh 9 PM.  Patient is here with hyperglycemia.  Patient was recently admitted for CHF and required BiPAP and IV Lasix.  Patient states that she has been in discretionary and what she eats and her sugar has been elevated.  However she had new onset of headaches and she just feels weak all over.  Patient is difficult IV stick so signed out pending labs and IV fluids and reassessment  10:44 PM I was able to place ultrasound IV and patient had labs drawn.  Patient's glucose is 678.  Her potassium is 5.8. She also has mild AKI with creatinine 2.1.  Due to her previous history of CHF requiring BiPAP, I only ordered 500 cc bolus.  And also ordered 10 units of subcu insulin.  Her glucose is still greater than 600.  At this point I started on IV insulin.  Hospitalist to admit for uncontrolled diabetes  CRITICAL CARE Performed by: Wandra Arthurs   Total critical care time: 30 minutes  Critical care time was exclusive of separately billable procedures and treating other patients.  Critical care was necessary to treat or prevent imminent or life-threatening deterioration.  Critical care was time spent personally by me on the following activities: development of treatment plan with patient and/or surrogate as well as nursing, discussions with consultants, evaluation of patient's response to treatment, examination of patient, obtaining history from patient or surrogate, ordering and performing treatments and interventions, ordering and review of laboratory studies, ordering and review of radiographic studies, pulse oximetry and re-evaluation of patient's condition.  Angiocath insertion Performed by: Wandra Arthurs  Consent: Verbal consent obtained. Risks and benefits: risks, benefits and alternatives were  discussed Time out: Immediately prior to procedure a "time out" was called to verify the correct patient, procedure, equipment, support staff and site/side marked as required.  Preparation: Patient was prepped and draped in the usual sterile fashion.  Vein Location: R antecube  Ultrasound Guided  Gauge: 20 long   Normal blood return and flush without difficulty Patient tolerance: Patient tolerated the procedure well with no immediate complications.     Problems Addressed: Hyperglycemia: acute illness or injury  Amount and/or Complexity of Data Reviewed Labs: ordered. Decision-making details documented in ED Course. Radiology: ordered and independent interpretation performed. Decision-making details documented in ED Course.  Risk Prescription drug management.          Drenda Freeze, MD 09/25/21 [phone]
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
## 2021-09-26 DIAGNOSIS — R519 Headache, unspecified: Secondary | ICD-10-CM | POA: Diagnosis not present

## 2021-09-26 DIAGNOSIS — E1165 Type 2 diabetes mellitus with hyperglycemia: Secondary | ICD-10-CM | POA: Diagnosis not present

## 2021-09-26 DIAGNOSIS — I5032 Chronic diastolic (congestive) heart failure: Secondary | ICD-10-CM | POA: Diagnosis not present

## 2021-09-26 DIAGNOSIS — G4733 Obstructive sleep apnea (adult) (pediatric): Secondary | ICD-10-CM | POA: Diagnosis present

## 2021-09-26 DIAGNOSIS — Z79899 Other long term (current) drug therapy: Secondary | ICD-10-CM | POA: Diagnosis not present

## 2021-09-26 DIAGNOSIS — Z7985 Long-term (current) use of injectable non-insulin antidiabetic drugs: Secondary | ICD-10-CM | POA: Diagnosis not present

## 2021-09-26 DIAGNOSIS — Z7401 Bed confinement status: Secondary | ICD-10-CM | POA: Diagnosis not present

## 2021-09-26 DIAGNOSIS — E785 Hyperlipidemia, unspecified: Secondary | ICD-10-CM | POA: Diagnosis present

## 2021-09-26 DIAGNOSIS — Z888 Allergy status to other drugs, medicaments and biological substances status: Secondary | ICD-10-CM | POA: Diagnosis not present

## 2021-09-26 DIAGNOSIS — N179 Acute kidney failure, unspecified: Secondary | ICD-10-CM | POA: Diagnosis not present

## 2021-09-26 DIAGNOSIS — E1169 Type 2 diabetes mellitus with other specified complication: Secondary | ICD-10-CM | POA: Diagnosis present

## 2021-09-26 DIAGNOSIS — E1140 Type 2 diabetes mellitus with diabetic neuropathy, unspecified: Secondary | ICD-10-CM | POA: Diagnosis not present

## 2021-09-26 DIAGNOSIS — F32A Depression, unspecified: Secondary | ICD-10-CM | POA: Diagnosis not present

## 2021-09-26 DIAGNOSIS — E1122 Type 2 diabetes mellitus with diabetic chronic kidney disease: Secondary | ICD-10-CM | POA: Diagnosis not present

## 2021-09-26 DIAGNOSIS — R739 Hyperglycemia, unspecified: Secondary | ICD-10-CM | POA: Diagnosis present

## 2021-09-26 DIAGNOSIS — Z6841 Body Mass Index (BMI) 40.0 and over, adult: Secondary | ICD-10-CM | POA: Diagnosis not present

## 2021-09-26 DIAGNOSIS — I130 Hypertensive heart and chronic kidney disease with heart failure and stage 1 through stage 4 chronic kidney disease, or unspecified chronic kidney disease: Secondary | ICD-10-CM | POA: Diagnosis not present

## 2021-09-26 DIAGNOSIS — E875 Hyperkalemia: Secondary | ICD-10-CM | POA: Diagnosis not present

## 2021-09-26 DIAGNOSIS — Z794 Long term (current) use of insulin: Secondary | ICD-10-CM | POA: Diagnosis not present

## 2021-09-26 DIAGNOSIS — Z885 Allergy status to narcotic agent status: Secondary | ICD-10-CM | POA: Diagnosis not present

## 2021-09-26 DIAGNOSIS — Z880 Allergy status to penicillin: Secondary | ICD-10-CM | POA: Diagnosis not present

## 2021-09-26 DIAGNOSIS — N1832 Chronic kidney disease, stage 3b: Secondary | ICD-10-CM | POA: Diagnosis not present

## 2021-09-26 DIAGNOSIS — Z87891 Personal history of nicotine dependence: Secondary | ICD-10-CM | POA: Diagnosis not present

## 2021-09-26 DIAGNOSIS — F419 Anxiety disorder, unspecified: Secondary | ICD-10-CM | POA: Diagnosis present

## 2021-09-26 LAB — GLUCOSE, CAPILLARY
Glucose-Capillary: 138 mg/dL — ABNORMAL HIGH (ref 70–99)
Glucose-Capillary: 153 mg/dL — ABNORMAL HIGH (ref 70–99)
Glucose-Capillary: 179 mg/dL — ABNORMAL HIGH (ref 70–99)
Glucose-Capillary: 194 mg/dL — ABNORMAL HIGH (ref 70–99)
Glucose-Capillary: 202 mg/dL — ABNORMAL HIGH (ref 70–99)
Glucose-Capillary: 206 mg/dL — ABNORMAL HIGH (ref 70–99)
Glucose-Capillary: 225 mg/dL — ABNORMAL HIGH (ref 70–99)
Glucose-Capillary: 233 mg/dL — ABNORMAL HIGH (ref 70–99)
Glucose-Capillary: 252 mg/dL — ABNORMAL HIGH (ref 70–99)
Glucose-Capillary: 274 mg/dL — ABNORMAL HIGH (ref 70–99)
Glucose-Capillary: 275 mg/dL — ABNORMAL HIGH (ref 70–99)
Glucose-Capillary: 281 mg/dL — ABNORMAL HIGH (ref 70–99)
Glucose-Capillary: 283 mg/dL — ABNORMAL HIGH (ref 70–99)
Glucose-Capillary: 370 mg/dL — ABNORMAL HIGH (ref 70–99)
Glucose-Capillary: 416 mg/dL — ABNORMAL HIGH (ref 70–99)
Glucose-Capillary: 492 mg/dL — ABNORMAL HIGH (ref 70–99)

## 2021-09-26 LAB — BASIC METABOLIC PANEL
Anion gap: 17 — ABNORMAL HIGH (ref 5–15)
Anion gap: 15 (ref 5–15)
BUN: 45 mg/dL — ABNORMAL HIGH (ref 8–23)
BUN: 50 mg/dL — ABNORMAL HIGH (ref 8–23)
CO2: 21 mmol/L — ABNORMAL LOW (ref 22–32)
CO2: 23 mmol/L (ref 22–32)
Calcium: 9.7 mg/dL (ref 8.9–10.3)
Calcium: 9.8 mg/dL (ref 8.9–10.3)
Chloride: 94 mmol/L — ABNORMAL LOW (ref 98–111)
Chloride: 98 mmol/L (ref 98–111)
Creatinine, Ser: 2.51 mg/dL — ABNORMAL HIGH (ref 0.44–1.00)
Creatinine, Ser: 3.03 mg/dL — ABNORMAL HIGH (ref 0.44–1.00)
GFR, Estimated: 20 mL/min — ABNORMAL LOW (ref >60)
GFR, Estimated: 16 mL/min — ABNORMAL LOW (ref >60)
Glucose, Bld: 397 mg/dL — ABNORMAL HIGH (ref 70–99)
Glucose, Bld: 255 mg/dL — ABNORMAL HIGH (ref 70–99)
Potassium: 4.7 mmol/L (ref 3.5–5.1)
Potassium: 4.6 mmol/L (ref 3.5–5.1)
Sodium: 132 mmol/L — ABNORMAL LOW (ref 135–145)
Sodium: 136 mmol/L (ref 135–145)

## 2021-09-26 LAB — CBC
HCT: 47.7 % — ABNORMAL HIGH (ref 36.0–46.0)
Hemoglobin: 14.6 g/dL (ref 12.0–15.0)
MCH: 25.2 pg — ABNORMAL LOW (ref 26.0–34.0)
MCHC: 30.6 g/dL (ref 30.0–36.0)
MCV: 82.4 fL (ref 80.0–100.0)
Platelets: 342 10*3/uL (ref 150–400)
RBC: 5.79 MIL/uL — ABNORMAL HIGH (ref 3.87–5.11)
RDW: 15.2 % (ref 11.5–15.5)
WBC: 7.4 10*3/uL (ref 4.0–10.5)
nRBC: 0 % (ref 0.0–0.2)

## 2021-09-26 LAB — CBG MONITORING, ED: Glucose-Capillary: 566 mg/dL (ref 70–99)

## 2021-09-26 MED ORDER — ACETAMINOPHEN 325 MG PO TABS
650.0000 mg | ORAL_TABLET | Freq: Four times a day (QID) | ORAL | Status: DC | PRN
  Administered 2021-09-27 – 2021-09-28 (×3): 650 mg via ORAL
  Filled 2021-09-26 (×3): qty 2

## 2021-09-26 MED ORDER — INSULIN ASPART 100 UNIT/ML IJ SOLN
0.0000 [IU] | Freq: Three times a day (TID) | INTRAMUSCULAR | Status: DC
  Administered 2021-09-26: 2 [IU] via SUBCUTANEOUS
  Administered 2021-09-27 (×2): 8 [IU] via SUBCUTANEOUS
  Administered 2021-09-27: 11 [IU] via SUBCUTANEOUS
  Administered 2021-09-28 (×2): 8 [IU] via SUBCUTANEOUS

## 2021-09-26 MED ORDER — INSULIN ASPART 100 UNIT/ML IJ SOLN
0.0000 [IU] | Freq: Every day | INTRAMUSCULAR | Status: DC
  Administered 2021-09-26: 3 [IU] via SUBCUTANEOUS
  Administered 2021-09-27: 2 [IU] via SUBCUTANEOUS

## 2021-09-26 MED ORDER — INSULIN GLARGINE-YFGN 100 UNIT/ML ~~LOC~~ SOLN
34.0000 [IU] | Freq: Every day | SUBCUTANEOUS | Status: DC
  Administered 2021-09-26: 34 [IU] via SUBCUTANEOUS
  Filled 2021-09-26 (×2): qty 0.34

## 2021-09-26 MED ORDER — INSULIN GLARGINE-YFGN 100 UNIT/ML ~~LOC~~ SOLN
16.0000 [IU] | Freq: Once | SUBCUTANEOUS | Status: AC
Start: 2021-09-26 — End: 2021-09-26
  Administered 2021-09-26: 16 [IU] via SUBCUTANEOUS
  Filled 2021-09-26: qty 0.16

## 2021-09-26 MED ORDER — INSULIN ASPART 100 UNIT/ML IJ SOLN
4.0000 [IU] | Freq: Three times a day (TID) | INTRAMUSCULAR | Status: DC
  Administered 2021-09-26 – 2021-09-28 (×6): 4 [IU] via SUBCUTANEOUS

## 2021-09-26 MED ORDER — SODIUM CHLORIDE 0.9 % IV SOLN: INTRAVENOUS | Status: DC

## 2021-09-26 MED ORDER — INSULIN GLARGINE-YFGN 100 UNIT/ML ~~LOC~~ SOLN
40.0000 [IU] | Freq: Two times a day (BID) | SUBCUTANEOUS | Status: DC
  Administered 2021-09-26: 40 [IU] via SUBCUTANEOUS
  Filled 2021-09-26 (×3): qty 0.4

## 2021-09-26 MED ORDER — INSULIN GLARGINE-YFGN 100 UNIT/ML ~~LOC~~ SOLN
40.0000 [IU] | Freq: Two times a day (BID) | SUBCUTANEOUS | Status: DC
  Filled 2021-09-26 (×2): qty 0.4

## 2021-09-26 MED ORDER — LIVING WELL WITH DIABETES BOOK
Freq: Once | Status: AC
  Filled 2021-09-26: qty 1

## 2021-09-26 NOTE — Progress Notes (Signed)
[name] [name] would benefit from a nutritional consult.  She admits to a daily diet of fast food that her family brings or processed lunch meats and/or hot dogs. Her CBG remains high and she is currently still on an insulin drip.

## 2021-09-26 NOTE — Progress Notes (Signed)
Pt stated she doesn't want to wear CPAP for the night.  

## 2021-09-26 NOTE — Progress Notes (Signed)
PROGRESS NOTE    [name] [name]  BHA:[id number] DOB: 1955/06/25 DOA: 09/25/2021 PCP: [facility]   Brief Narrative:  HPI: [name] [name] is a 66 y.o. female with medical history significant for HFpEF (EF 60-65% by TTE 09/05/2021), insulin-dependent T2DM, CKD stage IIIb, HTN, HLD, duodenal ulcer, OSA/OHS on CPAP, morbid obesity, nonambulatory bedbound status using [facility] lift since unrepaired femoral fracture, depression/anxiety who presented to the ED for evaluation of hyperglycemia.  Patient recently admitted 09/04/2021-09/09/2021 for acute hypoxic and hypercapnic respiratory failure due to acute on chronic HFpEF and Klebsiella bacteremia/UTI.  Had associated AKI.  Required diuresis and completed course of antibiotics.  Renal function improved with diuresis with creatinine 1.64 on discharge.  Patient returns with headache and hyperglycemia.  She reports adherence to insulin but also admits to dietary indiscretion.  She denies any chest pain, dyspnea, abdominal pain, nausea, vomiting.  She reports good urine output.   ED Course  Labs/Imaging on admission: I have personally reviewed following labs and imaging studies.   Initial vitals showed BP 152/88, pulse 89, RR 21, temp 97.6 F, SPO2 92% on room air.   Labs show serum glucose 646, potassium 5.8, chloride 91, bicarb 26, anion gap 13, BUN 43, creatinine 2.21, sodium 130 (143 when corrected for hyperglycemia), WBC 5.9, hemoglobin 14.5, platelets 328,000.   VBG shows pH 7.394, PCO2 47.5, PO2 76.   CT head without contrast negative for acute intracranial abnormality.  Large empty sella noted.   CT cervical spine without contrast shows mild multilevel degenerative disc disease and facet hypertrophy without acute osseous findings or spinal canal stenosis.   Patient was given 500 cc normal saline, 10 units subcutaneous NovoLog, IV Reglan and Benadryl.  CBG remained >600.  Patient was subsequently started on insulin infusion.   The hospitalist service was consulted to admit for further evaluation and management.    Assessment & Plan:   Principal Problem:   Type 2 diabetes mellitus with hyperglycemia, with long-term current use of insulin (HCC) Active Problems:   Acute renal failure superimposed on stage 3b chronic kidney disease (HCC)   Hyperkalemia   (HFpEF) heart failure with preserved ejection fraction (HCC)   Hyperlipidemia associated with type 2 diabetes mellitus (HCC)   Anxiety and depression   OSA on CPAP  Type 2 diabetes mellitus with hyperglycemia, with long-term current use of insulin ([place]) Admitted with severe hyperglycemia 670 without evidence of DKA/HHS.  Likely due to significant dietary indiscretion.  Reports adherence to home insulin use but admits to not being compliant with dietary restrictions for diabetes.  She was started on insulin drip and then she was transitioned from Ringer's lactate to dextrose and she continued to have hyperglycemia.  This morning when I saw him, I asked the nurse to stop the dextrose but continue insulin drip.  Patient has been on diet and I was informed that patient has been getting large sodas from the son who is bringing from outside.  We have now kept her n.p.o. because her sugar is running high despite of not getting any fluids and despite of already being on high dose of IV insulin drip.  We have also started her on Semglee 34 units in the morning which I have increased and will put her on 40 mg twice daily.  She has been informed that she is not supposed to drink sodas.   Acute renal failure superimposed on stage 3b chronic kidney disease ([place]) Creatinine 2.21 on admission.  Previously 1.64 when discharged on  last admission after IV diuresis.  I will now was started on maintenance fluid of normal saline at 100 cc/h.   (HFpEF) heart failure with preserved ejection fraction ([place]) Last EF 60-65% with G1 DD on TTE 09/05/2021.  Body habitus limits volume assessment  however overall appears slightly volume depleted in setting of severe hyperglycemia. -Hold home torsemide -Monitor strict I/O's and daily weights   Hyperkalemia: Resolved.  OSA on CPAP Continue CPAP nightly.   Anxiety and depression Continue Lexapro and hydroxyzine as needed.   Hyperlipidemia associated with type 2 diabetes mellitus (HCC) Continue atorvastatin.  Chronic bedbound status: Patient lives alone at her home and cannot get up and uses [facility] lift.  She has caregiver which comes to her house from 9-12 and 6-8.  They cook for her and provide the meals as well.  DVT prophylaxis: heparin injection 5,000 Units Start: 09/25/21 2330   Code Status: Full Code  Family Communication: Son present at bedside.  Plan of care discussed with patient in length and he/she verbalized understanding and agreed with it.  Status is: Observation The patient will require care spanning > 2 midnights and should be moved to inpatient because: Still significantly hyperglycemic with AKI.   Estimated body mass index is 51.69 kg/m as calculated from the following:   Height as of 09/07/21: '5\' 2"'$  (1.575 m).   Weight as of 09/09/21: 128.2 kg.    Nutritional Assessment: There is no height or weight on file to calculate BMI.. Seen by dietician.  I agree with the assessment and plan as outlined below: Nutrition Status:        . Skin Assessment: I have examined the patient's skin and I agree with the wound assessment as performed by the wound care RN as outlined below:    Consultants:  None  Procedures:  None  Antimicrobials:  Anti-infectives (From admission, onward)    None         Subjective: Patient seen and examined.  No new complaint.   Objective: Vitals:   09/26/21 0743 09/26/21 0747 09/26/21 1229 09/26/21 1230  BP: (!) 121/104 (!) 121/104 111/68 111/68  Pulse: (!) 114 (!) 114 (!) 104 (!) 104  Resp: 14 14 (!) 22 (!) 22  Temp: 97.9 F (36.6 C) 97.9 F (36.6 C) 97.9 F (36.6  C) 97.9 F (36.6 C)  TempSrc: Oral  Oral   SpO2: 93%  91%     Intake/Output Summary (Last 24 hours) at 09/26/2021 1254 Last data filed at 09/26/2021 6468 Gross per 24 hour  Intake 333.36 ml  Output --  Net 333.36 ml   There were no vitals filed for this visit.  Examination:  General exam: Appears calm and comfortable, super morbidly obese Respiratory system: Clear to auscultation. Respiratory effort normal. Cardiovascular system: S1 & S2 heard, RRR. No JVD, murmurs, rubs, gallops or clicks. No pedal edema. Gastrointestinal system: Abdomen is nondistended, soft and nontender. No organomegaly or masses felt. Normal bowel sounds heard. Central nervous system: Alert and oriented. No focal neurological deficits. Extremities: Symmetric 5 x 5 power. Skin: No rashes, lesions or ulcers Psychiatry: Judgement and insight appear poor.   Data Reviewed: I have personally reviewed following labs and imaging studies  CBC: Recent Labs  Lab 09/25/21 2118 09/25/21 2141 09/26/21 0301  WBC 5.9  --  7.4  NEUTROABS 4.2  --   --   HGB 14.5 17.0* 14.6  HCT 47.2* 50.0* 47.7*  MCV 83.0  --  82.4  PLT 328  --  818   Basic Metabolic Panel: Recent Labs  Lab 09/25/21 2118 09/25/21 2141 09/26/21 0301 09/26/21 1104  NA 130* 129* 132* 136  K 5.8* 5.6* 4.7 4.6  CL 91*  --  94* 98  CO2 26  --  21* 23  GLUCOSE 646*  --  397* 255*  BUN 43*  --  45* 50*  CREATININE 2.21*  --  2.51* 3.03*  CALCIUM 9.6  --  9.7 9.8   GFR: CrCl cannot be calculated (Unknown ideal weight.). Liver Function Tests: Recent Labs  Lab 09/25/21 2118  AST 21  ALT 18  ALKPHOS 162*  BILITOT 0.7  PROT 8.7*  ALBUMIN 3.8   No results for input(s): "LIPASE", "AMYLASE" in the last 168 hours. No results for input(s): "AMMONIA" in the last 168 hours. Coagulation Profile: No results for input(s): "INR", "PROTIME" in the last 168 hours. Cardiac Enzymes: No results for input(s): "CKTOTAL", "CKMB", "CKMBINDEX", "TROPONINI"  in the last 168 hours. BNP (last 3 results) No results for input(s): "PROBNP" in the last 8760 hours. HbA1C: No results for input(s): "HGBA1C" in the last 72 hours. CBG: Recent Labs  Lab 09/26/21 0817 09/26/21 0920 09/26/21 1014 09/26/21 1117 09/26/21 1217  GLUCAP 233* 275* 281* 274* 225*   Lipid Profile: No results for input(s): "CHOL", "HDL", "LDLCALC", "TRIG", "CHOLHDL", "LDLDIRECT" in the last 72 hours. Thyroid Function Tests: No results for input(s): "TSH", "T4TOTAL", "FREET4", "T3FREE", "THYROIDAB" in the last 72 hours. Anemia Panel: No results for input(s): "VITAMINB12", "FOLATE", "FERRITIN", "TIBC", "IRON", "RETICCTPCT" in the last 72 hours. Sepsis Labs: No results for input(s): "PROCALCITON", "LATICACIDVEN" in the last 168 hours.  No results found for this or any previous visit (from the past 240 hour(s)).   Radiology Studies: DG Chest Port 1 View  Result Date: 09/25/2021 CLINICAL DATA:  Headache, hyperglycemia, shortness of breath EXAM: PORTABLE CHEST 1 VIEW COMPARISON:  09/06/2021 FINDINGS: Single frontal view of the chest demonstrates stable enlargement of the cardiac silhouette. Lung volumes are diminished with crowding of the central vasculature. No acute airspace disease, effusion, or pneumothorax. No acute bony abnormality. IMPRESSION: 1. Low lung volumes.  No acute process. Electronically Signed   By: Randa Ngo M.D.   On: 09/25/2021 23:11   CT Cervical Spine Wo Contrast  Result Date: 09/25/2021 CLINICAL DATA:  Neck pain, chronic, degenerative changes on xray EXAM: CT CERVICAL SPINE WITHOUT CONTRAST TECHNIQUE: Multidetector CT imaging of the cervical spine was performed without intravenous contrast. Multiplanar CT image reconstructions were also generated. RADIATION DOSE REDUCTION: This exam was performed according to the departmental dose-optimization program which includes automated exposure control, adjustment of the mA and/or kV according to patient size and/or  use of iterative reconstruction technique. COMPARISON:  CT cervical spine 08/20/2019. No intervening radiographs. FINDINGS: Detailed assessment is limited by soft tissue attenuation from habitus creating noisy images. Alignment: Straightening of normal lordosis.  No listhesis. Skull base and vertebrae: No acute fracture. No primary bone lesion or focal pathologic process. Soft tissues and spinal canal: No prevertebral fluid or swelling. No visible canal hematoma. Disc levels: Mild diffuse disc space narrowing and anterior spurring. Mild multilevel facet hypertrophy. No high-grade canal stenosis. Upper chest: Nonacute. Other: Retropharyngeal course of the carotids with carotid atheromatous plaque. IMPRESSION: 1. Mild multilevel degenerative disc disease and facet hypertrophy. 2. No acute osseous findings. 3. No spinal canal stenosis. Electronically Signed   By: Keith Rake M.D.   On: 09/25/2021 22:14   CT HEAD WO CONTRAST (5MM)  Result Date: 09/25/2021  CLINICAL DATA:  Headache, chronic, new features or increased frequency EXAM: CT HEAD WITHOUT CONTRAST TECHNIQUE: Contiguous axial images were obtained from the base of the skull through the vertex without intravenous contrast. RADIATION DOSE REDUCTION: This exam was performed according to the departmental dose-optimization program which includes automated exposure control, adjustment of the mA and/or kV according to patient size and/or use of iterative reconstruction technique. COMPARISON:  Head CT 09/08/2020 FINDINGS: Brain: No intracranial hemorrhage, mass effect, or midline shift. Enlarged empty sella. No hydrocephalus. The basilar cisterns are patent. No evidence of territorial infarct or acute ischemia. No extra-axial or intracranial fluid collection. Vascular: Atherosclerosis of skullbase vasculature without hyperdense vessel or abnormal calcification. Skull: No fracture or focal lesion. Sinuses/Orbits: Mucosal thickening and debris in the left frontal  sinus as well as scattered throughout ethmoid air cells and the bilateral sphenoid sinuses. Chronic opacification of left mastoid air cells, although progressed from prior exam. Other: None. IMPRESSION: 1. No acute intracranial abnormality. 2. Enlarged empty sella, typically incidental but can be seen with idiopathic intracranial hypertension. 3. Paranasal sinus mucosal thickening and debris, query acute sinusitis. 4. Chronic left mastoid air cell opacification, although progressed from prior exam. Electronically Signed   By: Keith Rake M.D.   On: 09/25/2021 22:09    Scheduled Meds:  atorvastatin  10 mg Oral QHS   escitalopram  10 mg Oral q morning   heparin  5,000 Units Subcutaneous Q8H   insulin aspart  0-15 Units Subcutaneous TID WC   insulin aspart  0-5 Units Subcutaneous QHS   insulin aspart  4 Units Subcutaneous TID WC   insulin glargine-yfgn  34 Units Subcutaneous Daily   insulin glargine-yfgn  40 Units Subcutaneous BID   pantoprazole  40 mg Oral QAC breakfast   Continuous Infusions:  dextrose 5% lactated ringers Stopped (09/26/21 1008)   insulin 23 Units/hr (09/26/21 1017)   lactated ringers Stopped (09/26/21 0950)     LOS: 0 days   Darliss Cheney, MD [facility]  09/26/2021, 12:54 PM   *Please note that this is a verbal dictation therefore any spelling or grammatical errors are due to the "[place] [employer]" system interpretation.  Please page via [place] and do not message via secure chat for urgent patient care matters. Secure chat can be used for non urgent patient care matters.  How to contact the [facility] Attending or Consulting provider [place] or covering provider during after hours [place], for this patient?  Check the care team in [facility] and look for a) attending/consulting TR[facility] provider listed and b) the [facility] team listed. Page or secure chat [phone]. Log into [email or web address] and use [place]'s universal password to access. If you do not have the password, please contact  the hospital operator. Locate the [facility] provider you are looking for under [facility] and page to a number that you can be directly reached. If you still have difficulty reaching the provider, please page the [facility] (Director on Call) for the Hospitalists listed on amion for assistance.

## 2021-09-26 NOTE — Inpatient Diabetes Management (Signed)
Inpatient Diabetes Program Recommendations  AACE/ADA: New Consensus Statement on Inpatient Glycemic Control (2015)  Target Ranges:  Prepandial:   less than 140 mg/dL      Peak postprandial:   less than 180 mg/dL (1-2 hours)      Critically ill patients:  140 - 180 mg/dL   Lab Results  Component Value Date   GLUCAP 233 (H) 09/26/2021   HGBA1C 15.1 (H) 09/05/2021    Review of Glycemic Control  Latest Reference Range & Units 09/26/21 03:01  CO2 22 - 32 mmol/L 21 (L)  Anion gap 5 - 15  17 (H)  (L): Data is abnormally low (H): Data is abnormally high  Diabetes history: DM  Outpatient Diabetes medications:  Semglee 52 units QAM & 7 units QHS Ozempic 0.25 weekly  Current orders for Inpatient glycemic control:  Semglee 40 units BID Semglee 34 QD Novolog 0-15 units TID & 0-5 QHS Novolog 4 units MC  TID  Inpatient Diabetes Program Recommendations:    Current IV insulin drip rate is 10 units/hr.  Please continue IV insulin given drip rates and AG of 17 & CO2 21 at 0301 this am.    Of note, there are 2 different basal orders.    Will continue to follow while inpatient.  Thank you, Reche Dixon, MSN, [place] Diabetes Coordinator Inpatient Diabetes Program [phone] (team pager from 8a-5p)

## 2021-09-27 ENCOUNTER — Inpatient Hospital Stay (HOSPITAL_COMMUNITY): Payer: Medicare Other

## 2021-09-27 DIAGNOSIS — E1165 Type 2 diabetes mellitus with hyperglycemia: Secondary | ICD-10-CM | POA: Diagnosis not present

## 2021-09-27 DIAGNOSIS — Z794 Long term (current) use of insulin: Secondary | ICD-10-CM | POA: Diagnosis not present

## 2021-09-27 LAB — BASIC METABOLIC PANEL
Anion gap: 19 — ABNORMAL HIGH (ref 5–15)
Anion gap: 20 — ABNORMAL HIGH (ref 5–15)
Anion gap: 19 — ABNORMAL HIGH (ref 5–15)
BUN: 61 mg/dL — ABNORMAL HIGH (ref 8–23)
BUN: 59 mg/dL — ABNORMAL HIGH (ref 8–23)
BUN: 57 mg/dL — ABNORMAL HIGH (ref 8–23)
CO2: 19 mmol/L — ABNORMAL LOW (ref 22–32)
CO2: 19 mmol/L — ABNORMAL LOW (ref 22–32)
CO2: 16 mmol/L — ABNORMAL LOW (ref 22–32)
Calcium: 9 mg/dL (ref 8.9–10.3)
Calcium: 9.2 mg/dL (ref 8.9–10.3)
Calcium: 9 mg/dL (ref 8.9–10.3)
Chloride: 100 mmol/L (ref 98–111)
Chloride: 100 mmol/L (ref 98–111)
Chloride: 104 mmol/L (ref 98–111)
Creatinine, Ser: 3.63 mg/dL — ABNORMAL HIGH (ref 0.44–1.00)
Creatinine, Ser: 3.36 mg/dL — ABNORMAL HIGH (ref 0.44–1.00)
Creatinine, Ser: 3.04 mg/dL — ABNORMAL HIGH (ref 0.44–1.00)
GFR, Estimated: 13 mL/min — ABNORMAL LOW (ref >60)
GFR, Estimated: 14 mL/min — ABNORMAL LOW (ref >60)
GFR, Estimated: 16 mL/min — ABNORMAL LOW (ref >60)
Glucose, Bld: 338 mg/dL — ABNORMAL HIGH (ref 70–99)
Glucose, Bld: 298 mg/dL — ABNORMAL HIGH (ref 70–99)
Glucose, Bld: 283 mg/dL — ABNORMAL HIGH (ref 70–99)
Potassium: 5.4 mmol/L — ABNORMAL HIGH (ref 3.5–5.1)
Potassium: 4.5 mmol/L (ref 3.5–5.1)
Potassium: 4.7 mmol/L (ref 3.5–5.1)
Sodium: 138 mmol/L (ref 135–145)
Sodium: 139 mmol/L (ref 135–145)
Sodium: 139 mmol/L (ref 135–145)

## 2021-09-27 LAB — BETA-HYDROXYBUTYRIC ACID: Beta-Hydroxybutyric Acid: 0.53 mmol/L — ABNORMAL HIGH (ref 0.05–0.27)

## 2021-09-27 LAB — GLUCOSE, CAPILLARY
Glucose-Capillary: 338 mg/dL — ABNORMAL HIGH (ref 70–99)
Glucose-Capillary: 334 mg/dL — ABNORMAL HIGH (ref 70–99)
Glucose-Capillary: 283 mg/dL — ABNORMAL HIGH (ref 70–99)
Glucose-Capillary: 260 mg/dL — ABNORMAL HIGH (ref 70–99)
Glucose-Capillary: 247 mg/dL — ABNORMAL HIGH (ref 70–99)

## 2021-09-27 LAB — POTASSIUM: Potassium: 3.5 mmol/L (ref 3.5–5.1)

## 2021-09-27 MED ORDER — PREGABALIN 75 MG PO CAPS
75.0000 mg | ORAL_CAPSULE | Freq: Two times a day (BID) | ORAL | Status: DC
  Administered 2021-09-27 – 2021-09-28 (×3): 75 mg via ORAL
  Filled 2021-09-27 (×3): qty 1

## 2021-09-27 MED ORDER — INSULIN GLARGINE-YFGN 100 UNIT/ML ~~LOC~~ SOLN
50.0000 [IU] | Freq: Two times a day (BID) | SUBCUTANEOUS | Status: DC
  Administered 2021-09-27 – 2021-09-28 (×3): 50 [IU] via SUBCUTANEOUS
  Filled 2021-09-27 (×5): qty 0.5

## 2021-09-27 MED ORDER — SODIUM POLYSTYRENE SULFONATE 15 GM/60ML PO SUSP
15.0000 g | Freq: Once | ORAL | Status: AC
  Administered 2021-09-27: 15 g via ORAL
  Filled 2021-09-27: qty 60

## 2021-09-27 MED ORDER — NYSTATIN 100000 UNIT/GM EX POWD
1.0000 | Freq: Two times a day (BID) | CUTANEOUS | Status: DC
Start: 2021-09-27 — End: 2021-09-28
  Administered 2021-09-27 – 2021-09-28 (×3): 1 via TOPICAL
  Filled 2021-09-27: qty 15

## 2021-09-27 MED ORDER — MIRTAZAPINE 15 MG PO TABS: 15.0000 mg | ORAL_TABLET | Freq: Every evening | ORAL | Status: DC | PRN

## 2021-09-27 MED ORDER — HYDROXYZINE PAMOATE 25 MG PO CAPS: 25.0000 mg | ORAL_CAPSULE | Freq: Three times a day (TID) | ORAL | Status: DC | PRN

## 2021-09-27 MED ORDER — LORATADINE 10 MG PO TABS
10.0000 mg | ORAL_TABLET | Freq: Every day | ORAL | Status: DC
  Administered 2021-09-27 – 2021-09-28 (×2): 10 mg via ORAL
  Filled 2021-09-27 (×2): qty 1

## 2021-09-27 MED ORDER — KETOTIFEN FUMARATE 0.025 % OP SOLN: 1.0000 [drp] | Freq: Two times a day (BID) | OPHTHALMIC | Status: DC | PRN

## 2021-09-27 NOTE — Progress Notes (Signed)
PT Cancellation Note  Patient Details Name: [name] [name] MRN: [id number] DOB: 04-Oct-1955   Cancelled Treatment:    Reason Eval/Treat Not Completed: PT screened, no needs identified, will sign off. Pt is bedbound x multiple years. Hoyer lift at home and total care. No skilled PT needs.   [place] 09/27/2021, 1:50 PM [place] Office [phone]

## 2021-09-27 NOTE — Progress Notes (Signed)
Pt stated she doesn't want to use CPAP for the night.

## 2021-09-27 NOTE — Inpatient Diabetes Management (Addendum)
   Please consider obtaining a Beta-hydroxybuterate Inpatient Diabetes Program Recommendations  AACE/ADA: New Consensus Statement on Inpatient Glycemic Control (2015)  Target Ranges:  Prepandial:   less than 140 mg/dL      Peak postprandial:   less than 180 mg/dL (1-2 hours)      Critically ill patients:  140 - 180 mg/dL   Lab Results  Component Value Date   GLUCAP 334 (H) 09/27/2021   HGBA1C 15.1 (H) 09/05/2021    Review of Glycemic Control  Latest Reference Range & Units 09/27/21 05:53  CO2 22 - 32 mmol/L 19 (L)  (L): Data is abnormally low  Latest Reference Range & Units 09/27/21 05:53  Anion gap 5 - 15  19 (H)  (H): Data is abnormally high  Latest Reference Range & Units 09/27/21 06:10 09/27/21 07:53  Glucose-Capillary 70 - 99 mg/dL 338 (H) 334 (H)  (H): Data is abnormally high  Diabetes history: DM   Outpatient Diabetes medications:  Semglee 52 units QAM & 7 units QHS Ozempic 0.25 weekly   Current orders for Inpatient glycemic control:  Semglee 50 units BID Novolog 0-15 units TID & 0-5 QHS Novolog 4 units MC  TID  Inpatient Diabetes Program Recommendations:    She is complaining of a severe headache since early this morning.   Glucose spiked up overnight.  Did she snack O/N?   AG and CO2 are 19 this morning .  Is this renally related?    Please consider obtaining a beta-hydroxybutyrate.    Spoke with  patient at bedside.   Reviewed patient's current A1c of 15.1 %.  Explained what a A1c is and what it measures. Also reviewed goal A1c with patient, importance of good glucose control @ home, and blood sugar goals.  She states she is current with the [facility].  She states her A1C "stays up".  We reviewed LWWD booklet, CHO's, nutrition, eliminating sugary beverages.  She states she get Meals on Wheels once a day.  She usually fills up on fruits sucha as strawberries, watermelon and pineapple.  Reviewed fruits which are best to consume such as strawberries, berries  and apples.   Discussed basic DM patho, educated on The Plate Method, CHO's, portion control, CBGs at home fasting and mid afternoon, F/U with PCP every 3 months, bring meter to PCP office, long and short term complications of uncontrolled BG.    Placed RD consult.  She is interested in a [employer] CGM at [place].    Will continue to follow while inpatient.  Thank you, Reche Dixon, MSN, [place] Diabetes Coordinator Inpatient Diabetes Program [phone] (team pager from 8a-5p)

## 2021-09-27 NOTE — Progress Notes (Signed)
PROGRESS NOTE    [name] [name]  DJT:[id number] DOB: 10/18/1955 DOA: 09/25/2021 PCP: [facility]   Brief Narrative:  HPI: [name] [name] is a 66 y.o. female with medical history significant for HFpEF (EF 60-65% by TTE 09/05/2021), insulin-dependent T2DM, CKD stage IIIb, HTN, HLD, duodenal ulcer, OSA/OHS on CPAP, morbid obesity, nonambulatory bedbound status using [facility] lift since unrepaired femoral fracture, depression/anxiety who presented to the ED for evaluation of hyperglycemia.  Patient recently admitted 09/04/2021-09/09/2021 for acute hypoxic and hypercapnic respiratory failure due to acute on chronic HFpEF and Klebsiella bacteremia/UTI.  Had associated AKI.  Required diuresis and completed course of antibiotics.  Renal function improved with diuresis with creatinine 1.64 on discharge.  Patient returns with headache and hyperglycemia.  She reports adherence to insulin but also admits to dietary indiscretion.  She denies any chest pain, dyspnea, abdominal pain, nausea, vomiting.  She reports good urine output.   ED Course  Labs/Imaging on admission: I have personally reviewed following labs and imaging studies.   Initial vitals showed BP 152/88, pulse 89, RR 21, temp 97.6 F, SPO2 92% on room air.   Labs show serum glucose 646, potassium 5.8, chloride 91, bicarb 26, anion gap 13, BUN 43, creatinine 2.21, sodium 130 (143 when corrected for hyperglycemia), WBC 5.9, hemoglobin 14.5, platelets 328,000.   VBG shows pH 7.394, PCO2 47.5, PO2 76.   CT head without contrast negative for acute intracranial abnormality.  Large empty sella noted.   CT cervical spine without contrast shows mild multilevel degenerative disc disease and facet hypertrophy without acute osseous findings or spinal canal stenosis.   Patient was given 500 cc normal saline, 10 units subcutaneous NovoLog, IV Reglan and Benadryl.  CBG remained >600.  Patient was subsequently started on insulin infusion.   The hospitalist service was consulted to admit for further evaluation and management.    Assessment & Plan:   Principal Problem:   Type 2 diabetes mellitus with hyperglycemia, with long-term current use of insulin (HCC) Active Problems:   Acute renal failure superimposed on stage 3b chronic kidney disease (HCC)   Hyperkalemia   (HFpEF) heart failure with preserved ejection fraction (HCC)   Hyperlipidemia associated with type 2 diabetes mellitus (HCC)   Anxiety and depression   OSA on CPAP   Hyperglycemia due to type 2 diabetes mellitus ([place])  Type 2 diabetes mellitus with hyperglycemia, with long-term current use of insulin ([place]): Hemoglobin A1c 15% here. Admitted with severe hyperglycemia 670 without evidence of DKA/HHS.  Likely due to significant dietary indiscretion.  Reports adherence to home insulin use but admits to not being compliant with dietary restrictions for diabetes.  She was started on insulin drip.  She was noted to drink sugary drinks here in the room, brought to her by her son.  This caused further hyperglycemia.  Nonetheless, this improved, she was transitioned to long-acting insulin, blood sugar still elevated but not as bad as it was yesterday.  Will increase Lantus from 40units to 50 units twice daily.  Her anion gap is also elevated today at 20, checking beta hydroxybutyrate to see if she has DKA and needs insulin drip again.   Acute renal failure superimposed on stage 3b chronic kidney disease (HCC) Creatinine 2.21 on admission and rising and is 3.36.  Previously 1.64 when discharged on last admission.  I will now increase her fluids from 100 cc/h to 150 cc/h and obtain renal ultrasound.  Continue to avoid and hold nephrotoxic agents.   (HFpEF) heart  failure with preserved ejection fraction ([place]) Last EF 60-65% with G1 DD on TTE 09/05/2021.  Body habitus limits volume assessment however overall appears slightly volume depleted in setting of severe hyperglycemia. -Hold  home torsemide -Monitor strict I/O's and daily weights   Hyperkalemia: Resolved.  OSA on CPAP Continue CPAP nightly.   Anxiety and depression Continue Lexapro and hydroxyzine as needed.   Hyperlipidemia associated with type 2 diabetes mellitus (HCC) Continue atorvastatin.  Chronic bedbound status: Patient lives alone at her home and cannot get up and uses [facility] lift.  She has caregiver which comes to her house from 9-12 and 6-8.  They cook for her and provide the meals as well.  DVT prophylaxis: heparin injection 5,000 Units Start: 09/25/21 2330   Code Status: Full Code  Family Communication: None present at bedside.  Plan of care discussed with patient in length and he/she verbalized understanding and agreed with it.  Status is: Inpatient Remains inpatient appropriate because: Needs treatment for AKI.    Estimated body mass index is 51.69 kg/m as calculated from the following:   Height as of 09/07/21: '5\' 2"'$  (1.575 m).   Weight as of 09/09/21: 128.2 kg.    Nutritional Assessment: There is no height or weight on file to calculate BMI.. Seen by dietician.  I agree with the assessment and plan as outlined below: Nutrition Status:        . Skin Assessment: I have examined the patient's skin and I agree with the wound assessment as performed by the wound care RN as outlined below:    Consultants:  None  Procedures:  None  Antimicrobials:  Anti-infectives (From admission, onward)    None         Subjective:  Patient seen and examined.  She has no complaints today other than headache.  Objective: Vitals:   09/26/21 2252 09/27/21 0317 09/27/21 0746 09/27/21 1159  BP: 121/65 99/63  (!) 144/62  Pulse: 97 (!) 107  100  Resp: 17 19    Temp: 98.1 F (36.7 C) 98.2 F (36.8 C) 98.2 F (36.8 C) 98 F (36.7 C)  TempSrc: Oral Oral Oral Oral  SpO2: 98% 96%      Intake/Output Summary (Last 24 hours) at 09/27/2021 1342 Last data filed at 09/27/2021 0800 Gross per  24 hour  Intake 2386.63 ml  Output --  Net 2386.63 ml    There were no vitals filed for this visit.  Examination:  General exam: Appears calm and comfortable, super morbidly obese Respiratory system: Diminished breath sounds due to body habitus. Respiratory effort normal. Cardiovascular system: S1 & S2 heard, RRR. No JVD, murmurs, rubs, gallops or clicks. No pedal edema. Gastrointestinal system: Abdomen is nondistended, soft and nontender. No organomegaly or masses felt. Normal bowel sounds heard. Central nervous system: Alert and oriented. No focal neurological deficits. Extremities: Symmetric 5 x 5 power. Skin: No rashes, lesions or ulcers.  Psychiatry: Judgement and insight appear normal. Mood & affect appropriate.    Data Reviewed: I have personally reviewed following labs and imaging studies  CBC: Recent Labs  Lab 09/25/21 2118 09/25/21 2141 09/26/21 0301  WBC 5.9  --  7.4  NEUTROABS 4.2  --   --   HGB 14.5 17.0* 14.6  HCT 47.2* 50.0* 47.7*  MCV 83.0  --  82.4  PLT 328  --  409    Basic Metabolic Panel: Recent Labs  Lab 09/25/21 2118 09/25/21 2141 09/26/21 0301 09/26/21 1104 09/27/21 0553 09/27/21 0914  NA  130* 129* 132* 136 138 139  K 5.8* 5.6* 4.7 4.6 5.4* 4.5  CL 91*  --  94* 98 100 100  CO2 26  --  21* 23 19* 19*  GLUCOSE 646*  --  397* 255* 338* 298*  BUN 43*  --  45* 50* 61* 59*  CREATININE 2.21*  --  2.51* 3.03* 3.63* 3.36*  CALCIUM 9.6  --  9.7 9.8 9.0 9.2    GFR: CrCl cannot be calculated (Unknown ideal weight.). Liver Function Tests: Recent Labs  Lab 09/25/21 2118  AST 21  ALT 18  ALKPHOS 162*  BILITOT 0.7  PROT 8.7*  ALBUMIN 3.8    No results for input(s): "LIPASE", "AMYLASE" in the last 168 hours. No results for input(s): "AMMONIA" in the last 168 hours. Coagulation Profile: No results for input(s): "INR", "PROTIME" in the last 168 hours. Cardiac Enzymes: No results for input(s): "CKTOTAL", "CKMB", "CKMBINDEX", "TROPONINI" in  the last 168 hours. BNP (last 3 results) No results for input(s): "PROBNP" in the last 8760 hours. HbA1C: No results for input(s): "HGBA1C" in the last 72 hours. CBG: Recent Labs  Lab 09/26/21 1616 09/26/21 2141 09/27/21 0610 09/27/21 0753 09/27/21 1137  GLUCAP 138* 252* 338* 334* 283*    Lipid Profile: No results for input(s): "CHOL", "HDL", "LDLCALC", "TRIG", "CHOLHDL", "LDLDIRECT" in the last 72 hours. Thyroid Function Tests: No results for input(s): "TSH", "T4TOTAL", "FREET4", "T3FREE", "THYROIDAB" in the last 72 hours. Anemia Panel: No results for input(s): "VITAMINB12", "FOLATE", "FERRITIN", "TIBC", "IRON", "RETICCTPCT" in the last 72 hours. Sepsis Labs: No results for input(s): "PROCALCITON", "LATICACIDVEN" in the last 168 hours.  No results found for this or any previous visit (from the past 240 hour(s)).   Radiology Studies: DG Chest Port 1 View  Result Date: 09/25/2021 CLINICAL DATA:  Headache, hyperglycemia, shortness of breath EXAM: PORTABLE CHEST 1 VIEW COMPARISON:  09/06/2021 FINDINGS: Single frontal view of the chest demonstrates stable enlargement of the cardiac silhouette. Lung volumes are diminished with crowding of the central vasculature. No acute airspace disease, effusion, or pneumothorax. No acute bony abnormality. IMPRESSION: 1. Low lung volumes.  No acute process. Electronically Signed   By: Randa Ngo M.D.   On: 09/25/2021 23:11   CT Cervical Spine Wo Contrast  Result Date: 09/25/2021 CLINICAL DATA:  Neck pain, chronic, degenerative changes on xray EXAM: CT CERVICAL SPINE WITHOUT CONTRAST TECHNIQUE: Multidetector CT imaging of the cervical spine was performed without intravenous contrast. Multiplanar CT image reconstructions were also generated. RADIATION DOSE REDUCTION: This exam was performed according to the departmental dose-optimization program which includes automated exposure control, adjustment of the mA and/or kV according to patient size and/or  use of iterative reconstruction technique. COMPARISON:  CT cervical spine 08/20/2019. No intervening radiographs. FINDINGS: Detailed assessment is limited by soft tissue attenuation from habitus creating noisy images. Alignment: Straightening of normal lordosis.  No listhesis. Skull base and vertebrae: No acute fracture. No primary bone lesion or focal pathologic process. Soft tissues and spinal canal: No prevertebral fluid or swelling. No visible canal hematoma. Disc levels: Mild diffuse disc space narrowing and anterior spurring. Mild multilevel facet hypertrophy. No high-grade canal stenosis. Upper chest: Nonacute. Other: Retropharyngeal course of the carotids with carotid atheromatous plaque. IMPRESSION: 1. Mild multilevel degenerative disc disease and facet hypertrophy. 2. No acute osseous findings. 3. No spinal canal stenosis. Electronically Signed   By: Keith Rake M.D.   On: 09/25/2021 22:14   CT HEAD WO CONTRAST (5MM)  Result Date: 09/25/2021 CLINICAL  DATA:  Headache, chronic, new features or increased frequency EXAM: CT HEAD WITHOUT CONTRAST TECHNIQUE: Contiguous axial images were obtained from the base of the skull through the vertex without intravenous contrast. RADIATION DOSE REDUCTION: This exam was performed according to the departmental dose-optimization program which includes automated exposure control, adjustment of the mA and/or kV according to patient size and/or use of iterative reconstruction technique. COMPARISON:  Head CT 09/08/2020 FINDINGS: Brain: No intracranial hemorrhage, mass effect, or midline shift. Enlarged empty sella. No hydrocephalus. The basilar cisterns are patent. No evidence of territorial infarct or acute ischemia. No extra-axial or intracranial fluid collection. Vascular: Atherosclerosis of skullbase vasculature without hyperdense vessel or abnormal calcification. Skull: No fracture or focal lesion. Sinuses/Orbits: Mucosal thickening and debris in the left frontal  sinus as well as scattered throughout ethmoid air cells and the bilateral sphenoid sinuses. Chronic opacification of left mastoid air cells, although progressed from prior exam. Other: None. IMPRESSION: 1. No acute intracranial abnormality. 2. Enlarged empty sella, typically incidental but can be seen with idiopathic intracranial hypertension. 3. Paranasal sinus mucosal thickening and debris, query acute sinusitis. 4. Chronic left mastoid air cell opacification, although progressed from prior exam. Electronically Signed   By: Keith Rake M.D.   On: 09/25/2021 22:09    Scheduled Meds:  atorvastatin  10 mg Oral QHS   escitalopram  10 mg Oral q morning   heparin  5,000 Units Subcutaneous Q8H   insulin aspart  0-15 Units Subcutaneous TID WC   insulin aspart  0-5 Units Subcutaneous QHS   insulin aspart  4 Units Subcutaneous TID WC   insulin glargine-yfgn  50 Units Subcutaneous BID   loratadine  10 mg Oral Daily   nystatin  1 Application Topical BID   pantoprazole  40 mg Oral QAC breakfast   pregabalin  75 mg Oral BID   Continuous Infusions:  sodium chloride 100 mL/hr at 09/27/21 1022   dextrose 5% lactated ringers Stopped (09/26/21 0941)   lactated ringers Stopped (09/26/21 0458)     LOS: 1 day   Darliss Cheney, MD [facility]  09/27/2021, 1:42 PM   *Please note that this is a verbal dictation therefore any spelling or grammatical errors are due to the "[employer]" system interpretation.  Please page via [place] and do not message via secure chat for urgent patient care matters. Secure chat can be used for non urgent patient care matters.  How to contact the [facility] Attending or Consulting provider [place] or covering provider during after hours [place], for this patient?  Check the care team in [facility] and look for a) attending/consulting [facility] provider listed and b) the [facility] team listed. Page or secure chat [phone]. Log into [email or web address] and use [place]'s universal password to  access. If you do not have the password, please contact the hospital operator. Locate the [facility] provider you are looking for under [facility] and page to a number that you can be directly reached. If you still have difficulty reaching the provider, please page the [facility] (Director on Call) for the Hospitalists listed on amion for assistance.

## 2021-09-27 NOTE — Progress Notes (Signed)
OT Cancellation Note and Discharge  Patient Details Name: [name] [name] MRN: [id number] DOB: 1955/10/10   Cancelled Treatment:    Reason Eval/Treat Not Completed: OT screened, no needs identified, will sign off. Per this chart and OT/PT notes from 09/08/2021 pt is bedbound/hoyer lift and requires Total Assist/Care from her caregivers. No skilled OT needs, we will sign off.  [place], OTR/L [facility] [place] [phone] Office [phone]    Almon Register 09/27/2021, 7:30 AM

## 2021-09-28 DIAGNOSIS — E1165 Type 2 diabetes mellitus with hyperglycemia: Secondary | ICD-10-CM | POA: Diagnosis not present

## 2021-09-28 DIAGNOSIS — Z794 Long term (current) use of insulin: Secondary | ICD-10-CM | POA: Diagnosis not present

## 2021-09-28 LAB — BASIC METABOLIC PANEL
Anion gap: 12 (ref 5–15)
BUN: 48 mg/dL — ABNORMAL HIGH (ref 8–23)
CO2: 21 mmol/L — ABNORMAL LOW (ref 22–32)
Calcium: 8.9 mg/dL (ref 8.9–10.3)
Chloride: 107 mmol/L (ref 98–111)
Creatinine, Ser: 2.44 mg/dL — ABNORMAL HIGH (ref 0.44–1.00)
GFR, Estimated: 21 mL/min — ABNORMAL LOW (ref >60)
Glucose, Bld: 248 mg/dL — ABNORMAL HIGH (ref 70–99)
Potassium: 3.8 mmol/L (ref 3.5–5.1)
Sodium: 140 mmol/L (ref 135–145)

## 2021-09-28 LAB — GLUCOSE, CAPILLARY
Glucose-Capillary: 260 mg/dL — ABNORMAL HIGH (ref 70–99)
Glucose-Capillary: 263 mg/dL — ABNORMAL HIGH (ref 70–99)

## 2021-09-28 LAB — POTASSIUM: Potassium: 3.4 mmol/L — ABNORMAL LOW (ref 3.5–5.1)

## 2021-09-28 MED ORDER — POTASSIUM CHLORIDE CRYS ER 20 MEQ PO TBCR
40.0000 meq | EXTENDED_RELEASE_TABLET | Freq: Once | ORAL | Status: AC
Start: 2021-09-28 — End: 2021-09-28
  Administered 2021-09-28: 40 meq via ORAL
  Filled 2021-09-28: qty 2

## 2021-09-28 MED ORDER — TORSEMIDE 20 MG PO TABS: 40.0000 mg | ORAL_TABLET | Freq: Every day | ORAL | 1 refills | Status: DC

## 2021-09-28 MED ORDER — IBUPROFEN 400 MG PO TABS
800.0000 mg | ORAL_TABLET | Freq: Once | ORAL | Status: AC
Start: 2021-09-28 — End: 2021-09-28
  Administered 2021-09-28: 800 mg via ORAL
  Filled 2021-09-28: qty 2

## 2021-09-28 MED ORDER — INSULIN GLARGINE-YFGN 100 UNIT/ML ~~LOC~~ SOPN: 60.0000 [IU] | PEN_INJECTOR | Freq: Two times a day (BID) | SUBCUTANEOUS | 1 refills | Status: DC

## 2021-09-28 NOTE — Discharge Summary (Signed)
[place] Discharge Summary  [name] [name] BSW:[id number] DOB: 08/27/1955 DOA: 09/25/2021  PCP: [facility]  Admit date: 09/25/2021 Discharge date: 09/28/2021 30 Day Unplanned Readmission Risk Score    Flowsheet Row ED to Hosp-Admission (Current) from 09/25/2021 in [facility] 4E CV SURGICAL PROGRESSIVE CARE  30 Day Unplanned Readmission Risk Score (%) 28.24 Filed at 09/28/2021 0801       This score is the patient's risk of an unplanned readmission within 30 days of being discharged (0 -100%). The score is based on dignosis, age, lab data, medications, orders, and past utilization.   Low:  0-14.9   Medium: 15-21.9   High: 22-29.9   Extreme: 30 and above          Admitted From: Home Disposition: Home  Recommendations for Outpatient Follow-up:  Follow up with PCP in 1-2 weeks Please obtain BMP/CBC in one week Please follow up with your PCP on the following pending results: Unresulted Labs (From admission, onward)     Start     Ordered   09/27/21 0837  Potassium  STAT Now then every 4 hours ,   R (with STAT occurrences)     Comments: Until normal twice.   Question:  Specimen collection method  Answer:  Lab=Lab collect   09/27/21 0836   09/27/21 4665  Basic metabolic panel  Daily at 5am,   R      09/26/21 0512   09/25/21 2317  Urinalysis, Routine w reflex microscopic  Once,   R        09/25/21 2316              Home Health: Yes Equipment/Devices: None  Discharge Condition: Stable CODE STATUS: Full code Diet recommendation: Diabetic  Subjective: Seen and examined.  She has no complaints.  She is ready to go home.  HPI: [name] [name] is a 66 y.o. female with medical history significant for HFpEF (EF 60-65% by TTE 09/05/2021), insulin-dependent T2DM, CKD stage IIIb, HTN, HLD, duodenal ulcer, OSA/OHS on CPAP, morbid obesity, nonambulatory bedbound status using [facility] lift since unrepaired femoral fracture, depression/anxiety who presented to the ED for  evaluation of hyperglycemia.  Patient recently admitted 09/04/2021-09/09/2021 for acute hypoxic and hypercapnic respiratory failure due to acute on chronic HFpEF and Klebsiella bacteremia/UTI.  Had associated AKI.  Required diuresis and completed course of antibiotics.  Renal function improved with diuresis with creatinine 1.64 on discharge.  Patient returns with headache and hyperglycemia.  She reports adherence to insulin but also admits to dietary indiscretion.  She denies any chest pain, dyspnea, abdominal pain, nausea, vomiting.  She reports good urine output.   ED Course  Labs/Imaging on admission: I have personally reviewed following labs and imaging studies.   Initial vitals showed BP 152/88, pulse 89, RR 21, temp 97.6 F, SPO2 92% on room air.   Labs show serum glucose 646, potassium 5.8, chloride 91, bicarb 26, anion gap 13, BUN 43, creatinine 2.21, sodium 130 (143 when corrected for hyperglycemia), WBC 5.9, hemoglobin 14.5, platelets 328,000.   VBG shows pH 7.394, PCO2 47.5, PO2 76.   CT head without contrast negative for acute intracranial abnormality.  Large empty sella noted.   CT cervical spine without contrast shows mild multilevel degenerative disc disease and facet hypertrophy without acute osseous findings or spinal canal stenosis.   Patient was given 500 cc normal saline, 10 units subcutaneous NovoLog, IV Reglan and Benadryl.  CBG remained >600.  Patient was subsequently started on insulin infusion.  The hospitalist  service was consulted to admit for further evaluation and management.  Brief/Interim Summary:   Type 2 diabetes mellitus with hyperglycemia, with long-term current use of insulin ([place]): Hemoglobin A1c 15% here. Admitted with severe hyperglycemia 670 without evidence of DKA/HHS.  Likely due to significant dietary indiscretion.  Reports adherence to home insulin use but admits to not being compliant with dietary restrictions for diabetes.  She was started on  insulin drip.  She was noted to drink sugary drinks here in the room, brought to her by her son.  This caused further hyperglycemia.  Nonetheless, this improved, she was transitioned to long-acting insulin, which was subsequently adjusted and increased to 50 units twice daily, blood sugar better but is still around 250 range.  At discharge, I am discharging her with Lantus 60 units twice daily.   Acute renal failure superimposed on stage 3b chronic kidney disease (HCC) Creatinine 2.21 on admission and peaked at 3.36.  Previously 1.64 when discharged on last admission.  Increased IV fluids, improving and down to 2.6.  I have advised her to continue to hold her diuretic for next 3 days.  Then resume.   (HFpEF) heart failure with preserved ejection fraction ([place]) Last EF 60-65% with G1 DD on TTE 09/05/2021.  Body habitus limits volume assessment however overall appears slightly volume depleted in setting of severe hyperglycemia. -Hold home torsemide for 3 more days due to AKI.   Hyperkalemia: Resolved.   OSA on CPAP Continue CPAP nightly.   Anxiety and depression Continue Lexapro and hydroxyzine as needed.   Hyperlipidemia associated with type 2 diabetes mellitus (HCC) Continue atorvastatin.   Chronic bedbound status: Patient lives alone at her home and cannot get up and uses [facility] lift.  She has caregiver which comes to her house from 9-12 and 6-8.  They cook for her and provide the meals as well.  Headache: She complains of headache and is requesting ibuprofen as Tylenol is not helping her.  I prescribed her 1 dose today.  I have advised her to not take any NSAIDs on constant basis at home.  She verbalized understanding.  Discharge plan was discussed with patient and/or family member and they verbalized understanding and agreed with it.  Discharge Diagnoses:  Principal Problem:   Type 2 diabetes mellitus with hyperglycemia, with long-term current use of insulin (HCC) Active Problems:    Acute renal failure superimposed on stage 3b chronic kidney disease (HCC)   Hyperkalemia   (HFpEF) heart failure with preserved ejection fraction (HCC)   Hyperlipidemia associated with type 2 diabetes mellitus (HCC)   Anxiety and depression   OSA on CPAP   Hyperglycemia due to type 2 diabetes mellitus [facility])    Discharge Instructions  Discharge Instructions     Ambulatory referral to Nutrition and Diabetic Education   Complete by: As directed    Diabetes, morbid obesity, wants help with diet      Allergies as of 09/28/2021       Reactions   Lisinopril Swelling   Morphine Itching, Rash   Penicillins Hives, Itching, Nausea And Vomiting, Rash   Has patient had a PCN reaction causing immediate rash, facial/tongue/throat swelling, SOB or lightheadedness with hypotension: No Has patient had a PCN reaction causing severe rash involving mucus membranes or skin necrosis: No Has patient had a PCN reaction that required hospitalization: No Has patient had a PCN reaction occurring within the last 10 years: No If all of the above answers are "NO", then may proceed with Cephalosporin  use.        Medication List     STOP taking these medications    hydrOXYzine 25 MG capsule Commonly known as: VISTARIL       TAKE these medications    acetaminophen 500 MG tablet Commonly known as: TYLENOL Take 500 mg by mouth every 6 (six) hours as needed.   albuterol 108 (90 Base) MCG/ACT inhaler Commonly known as: VENTOLIN HFA Inhale 2 puffs into the lungs every 6 (six) hours as needed for wheezing or shortness of breath.   atorvastatin 10 MG tablet Commonly known as: LIPITOR Take 10 mg by mouth at bedtime.   cetirizine 10 MG tablet Commonly known as: ZYRTEC Take 10 mg by mouth daily.   escitalopram 10 MG tablet Commonly known as: LEXAPRO Take 1 tablet (10 mg total) by mouth every morning.   insulin glargine-yfgn 100 UNIT/ML Pen Commonly known as: SEMGLEE Inject 60 Units into the  skin 2 (two) times daily.  52 units in the morning and 7 units at bedtime What changed:  how much to take when to take this   ketotifen 0.025 % ophthalmic solution Commonly known as: ZADITOR Place 1 drop into both eyes 2 (two) times daily as needed. What changed: reasons to take this   mirtazapine 15 MG tablet Commonly known as: REMERON Take 15 mg by mouth at bedtime as needed (sleep).   Nyamyc powder Generic drug: nystatin Apply to affected area 2 (two) times daily to groin and areas of fungal infection   nystatin cream Commonly known as: MYCOSTATIN Apply 1 Application topically 2 (two) times daily.   pantoprazole 40 MG tablet Commonly known as: PROTONIX Take 40 mg by mouth daily before breakfast.   potassium chloride SA 20 MEQ tablet Commonly known as: KLOR-CON M Take 1 tablet (20 mEq total) by mouth daily.   pregabalin 75 MG capsule Commonly known as: LYRICA Take 75 mg by mouth 2 (two) times daily.   Semaglutide(0.25 or 0.'5MG'$ /DOS) 2 MG/1.5ML Sopn Inject 2 mg into the skin every Thursday.   torsemide 20 MG tablet Commonly known as: DEMADEX Take 2 tablets (40 mg total) by mouth daily. Start taking on: October 01, 2021 What changed: These instructions start on October 01, 2021. If you are unsure what to do until then, ask your doctor or other care provider.   Unifine Pentips 31G X 5 MM Misc Generic drug: Insulin Pen Needle Use as directed daily        Follow-up Information     [facility] Follow up in 1 week(s).   Contact information: [place] [address] [phone]                Allergies  Allergen Reactions   Lisinopril Swelling   Morphine Itching and Rash   Penicillins Hives, Itching, Nausea And Vomiting and Rash    Has patient had a PCN reaction causing immediate rash, facial/tongue/throat swelling, SOB or lightheadedness with hypotension: No Has patient had a PCN reaction causing severe rash  involving mucus membranes or skin necrosis: No Has patient had a PCN reaction that required hospitalization: No Has patient had a PCN reaction occurring within the last 10 years: No If all of the above answers are "NO", then may proceed with Cephalosporin use.     Consultations: None   Procedures/Studies: US RENAL  Result Date: 09/27/2021 CLINICAL DATA:  Acute kidney injury. EXAM: RENAL / URINARY TRACT ULTRASOUND COMPLETE COMPARISON:  Renal ultrasound dated September 05, 2021.  FINDINGS: Right Kidney: Renal measurements: 10.8 x 5.5 x 4.7 cm = volume: 146 mL. Echogenicity within normal limits. No mass or hydronephrosis visualized. Left Kidney: Renal measurements: 9.1 x 4.6 x 3.9 cm = volume: 85 mL. Echogenicity within normal limits. No mass or hydronephrosis visualized. Bladder: Appears normal for degree of bladder distention. Other: None. IMPRESSION: 1. Normal renal ultrasound. Electronically Signed   By: Titus Dubin M.D.   On: 09/27/2021 16:30   DG Chest Port 1 View  Result Date: 09/25/2021 CLINICAL DATA:  Headache, hyperglycemia, shortness of breath EXAM: PORTABLE CHEST 1 VIEW COMPARISON:  09/06/2021 FINDINGS: Single frontal view of the chest demonstrates stable enlargement of the cardiac silhouette. Lung volumes are diminished with crowding of the central vasculature. No acute airspace disease, effusion, or pneumothorax. No acute bony abnormality. IMPRESSION: 1. Low lung volumes.  No acute process. Electronically Signed   By: Randa Ngo M.D.   On: 09/25/2021 23:11   CT Cervical Spine Wo Contrast  Result Date: 09/25/2021 CLINICAL DATA:  Neck pain, chronic, degenerative changes on xray EXAM: CT CERVICAL SPINE WITHOUT CONTRAST TECHNIQUE: Multidetector CT imaging of the cervical spine was performed without intravenous contrast. Multiplanar CT image reconstructions were also generated. RADIATION DOSE REDUCTION: This exam was performed according to the departmental dose-optimization program which  includes automated exposure control, adjustment of the mA and/or kV according to patient size and/or use of iterative reconstruction technique. COMPARISON:  CT cervical spine 08/20/2019. No intervening radiographs. FINDINGS: Detailed assessment is limited by soft tissue attenuation from habitus creating noisy images. Alignment: Straightening of normal lordosis.  No listhesis. Skull base and vertebrae: No acute fracture. No primary bone lesion or focal pathologic process. Soft tissues and spinal canal: No prevertebral fluid or swelling. No visible canal hematoma. Disc levels: Mild diffuse disc space narrowing and anterior spurring. Mild multilevel facet hypertrophy. No high-grade canal stenosis. Upper chest: Nonacute. Other: Retropharyngeal course of the carotids with carotid atheromatous plaque. IMPRESSION: 1. Mild multilevel degenerative disc disease and facet hypertrophy. 2. No acute osseous findings. 3. No spinal canal stenosis. Electronically Signed   By: Keith Rake M.D.   On: 09/25/2021 22:14   CT HEAD WO CONTRAST (5MM)  Result Date: 09/25/2021 CLINICAL DATA:  Headache, chronic, new features or increased frequency EXAM: CT HEAD WITHOUT CONTRAST TECHNIQUE: Contiguous axial images were obtained from the base of the skull through the vertex without intravenous contrast. RADIATION DOSE REDUCTION: This exam was performed according to the departmental dose-optimization program which includes automated exposure control, adjustment of the mA and/or kV according to patient size and/or use of iterative reconstruction technique. COMPARISON:  Head CT 09/08/2020 FINDINGS: Brain: No intracranial hemorrhage, mass effect, or midline shift. Enlarged empty sella. No hydrocephalus. The basilar cisterns are patent. No evidence of territorial infarct or acute ischemia. No extra-axial or intracranial fluid collection. Vascular: Atherosclerosis of skullbase vasculature without hyperdense vessel or abnormal calcification.  Skull: No fracture or focal lesion. Sinuses/Orbits: Mucosal thickening and debris in the left frontal sinus as well as scattered throughout ethmoid air cells and the bilateral sphenoid sinuses. Chronic opacification of left mastoid air cells, although progressed from prior exam. Other: None. IMPRESSION: 1. No acute intracranial abnormality. 2. Enlarged empty sella, typically incidental but can be seen with idiopathic intracranial hypertension. 3. Paranasal sinus mucosal thickening and debris, query acute sinusitis. 4. Chronic left mastoid air cell opacification, although progressed from prior exam. Electronically Signed   By: Keith Rake M.D.   On: 09/25/2021 22:09   DG Chest  Port 1 View  Result Date: 09/06/2021 CLINICAL DATA:  Hypoxic respiratory failure. EXAM: PORTABLE CHEST 1 VIEW COMPARISON:  Portable 09/04/2021 FINDINGS: 3:32 a.m. The patient rotated to the left. The heart is enlarged as before but today there is increased perihilar vascular congestion and increased generalized interstitial consolidation consistent with edema. Small pleural effusions are beginning to develop. Interstitial haziness in the lung bases in the setting of somewhat low inspiration, could indicate atelectasis, ground-glass edema, pneumonitis or layering effusions. No other focal lung opacity is seen. The mediastinum is stable. There is thoracic spondylosis. IMPRESSION: Onset perihilar vascular congestion with mild generalized interstitial edema since 09/04/2021, with small pleural effusions developing. Bibasilar hazy opacities with differential diagnosis as above. Electronically Signed   By: Telford Nab M.D.   On: 09/06/2021 04:35   VAS [place] LOWER EXTREMITY VENOUS (DVT)  Result Date: 09/05/2021  Lower Venous DVT Study Patient Name:  IYANIA DENNE  Date of Exam:   09/05/2021 Medical Rec #: [id number]           Accession #:    [id number] Date of Birth: Feb 24, 1955           Patient Gender: F Patient Age:   1 years Exam  Location:  [facility] Procedure:      VAS [place] LOWER EXTREMITY VENOUS (DVT) Referring Phys: [facility]VA --------------------------------------------------------------------------------  Indications: SOB, and and elevated d-dimer (1.60).  Risk Factors: Obesity. Limitations: Body habitus and poor ultrasound/tissue interface. Comparison Study: Previous exam on 04/21/21 was negative for DVT Performing Technologist: Rogelia Rohrer RVT, RDMS  Examination Guidelines: A complete evaluation includes B-mode imaging, spectral Doppler, color Doppler, and power Doppler as needed of all accessible portions of each vessel. Bilateral testing is considered an integral part of a complete examination. Limited examinations for reoccurring indications may be performed as noted. The reflux portion of the exam is performed with the patient in reverse Trendelenburg.  +--------+---------------+---------+-----------+------------+-----------------+ RIGHT   CompressibilityPhasicitySpontaneityProperties  Thrombus Aging    +--------+---------------+---------+-----------+------------+-----------------+ CFV     Full           No       Yes        pulsatile                                                                flow                          +--------+---------------+---------+-----------+------------+-----------------+ SFJ     Full                                                             +--------+---------------+---------+-----------+------------+-----------------+ FV Prox Full           No       Yes        pulsatile  flow                          +--------+---------------+---------+-----------+------------+-----------------+ FV Mid  Full           No       Yes        pulsatile                                                                flow                           +--------+---------------+---------+-----------+------------+-----------------+ FV                                                     Not visualized    Distal                                                                   +--------+---------------+---------+-----------+------------+-----------------+ PFV                                                    Not well                                                                 visualized        +--------+---------------+---------+-----------+------------+-----------------+ POP     Full           Yes      Yes                                      +--------+---------------+---------+-----------+------------+-----------------+ PTV     Full                                                             +--------+---------------+---------+-----------+------------+-----------------+ PERO                                                   Not well  visualized        +--------+---------------+---------+-----------+------------+-----------------+   Right Technical Findings: Distal femoral vein and PFV not visualized on this exam. Limited exam due to habitus.  +--------+---------------+---------+-----------+------------+-----------------+ LEFT    CompressibilityPhasicitySpontaneityProperties  Thrombus Aging    +--------+---------------+---------+-----------+------------+-----------------+ CFV                    No       Yes        pulsatile   Not well                                                     flow        visualized        +--------+---------------+---------+-----------+------------+-----------------+ SFJ                                                    not visualized    +--------+---------------+---------+-----------+------------+-----------------+ FV Prox Full           No       No         pulsatile                                                                 flow                          +--------+---------------+---------+-----------+------------+-----------------+ FV Mid  Full           No       Yes        pulsatile                                                                flow                          +--------+---------------+---------+-----------+------------+-----------------+ FV      Full           No       Yes        pulsatile                     Distal                                     flow                          +--------+---------------+---------+-----------+------------+-----------------+ PFV  not visualized    +--------+---------------+---------+-----------+------------+-----------------+ POP     Full           No       Yes        pulsatile                                                                flow                          +--------+---------------+---------+-----------+------------+-----------------+ PTV                                                    Not well                                                                 visualized        +--------+---------------+---------+-----------+------------+-----------------+ PERO    Full                                                             +--------+---------------+---------+-----------+------------+-----------------+   Left Technical Findings: SFJ and PFV not seen on this exam. Limited exam due to habitus.   Summary: BILATERAL: -No evidence of popliteal cyst, bilaterally. RIGHT: - There is no evidence of deep vein thrombosis in the lower extremity. However, portions of this examination were limited- see technologist comments above.  LEFT: - There is no evidence of deep vein thrombosis in the lower extremity. However, portions of this examination were limited- see technologist comments above.  *See table(s) above  for measurements and observations. Electronically signed by Harold Barban MD on 09/05/2021 at 7:15:09 PM.    Final    ECHOCARDIOGRAM COMPLETE  Result Date: 09/05/2021    ECHOCARDIOGRAM REPORT   Patient Name:   TALISHA ERBY Date of Exam: 09/05/2021 Medical Rec #:  [id number]          Height:       62.0 in Accession #:    [id number]         Weight:       290.0 lb Date of Birth:  [birth year]          BSA:          2.239 m Patient Age:    58 years           BP:           102/62 mmHg Patient Gender: F                  HR:           77 bpm. Exam Location:  Inpatient Procedure: 2D Echo, Cardiac Doppler and Color Doppler Indications:  Elevated Troponin  History:        Patient has prior history of Echocardiogram examinations, most                 recent 07/26/2020. Risk Factors:Diabetes and Dyslipidemia.  Sonographer:    Luane School RDCS Referring Phys: [place]  1. Left ventricular ejection fraction, by estimation, is 60 to 65%. The left ventricle has normal function. Left ventricular endocardial border not optimally defined to evaluate regional wall motion. There is moderate left ventricular hypertrophy. Left ventricular diastolic parameters are consistent with Grade I diastolic dysfunction (impaired relaxation).  2. RV poorly visualized. Grossly appears enlarged with decreased systolic function. . Right ventricular systolic function was not well visualized. The right ventricular size is not well visualized. Tricuspid regurgitation signal is inadequate for assessing PA pressure.  3. The mitral valve was not well visualized. No evidence of mitral valve regurgitation. No evidence of mitral stenosis.  4. The aortic valve is tricuspid. Aortic valve regurgitation is not visualized. No aortic stenosis is present.  5. The inferior vena cava is normal in size with greater than 50% respiratory variability, suggesting right atrial pressure of 3 mmHg. FINDINGS  Left Ventricle: LVOT VTI and velocities are  underestimated based on angle of acquisition. Left ventricular ejection fraction, by estimation, is 60 to 65%. The left ventricle has normal function. Left ventricular endocardial border not optimally defined to evaluate regional wall motion. The left ventricular internal cavity size was normal in size. There is moderate left ventricular hypertrophy. Left ventricular diastolic parameters are consistent with Grade I diastolic dysfunction (impaired relaxation).  Normal left ventricular filling pressure. Right Ventricle: RV poorly visualized. Grossly appears enlarged with decreased systolic function. The right ventricular size is not well visualized. Right vetricular wall thickness was not well visualized. Right ventricular systolic function was not well  visualized. Tricuspid regurgitation signal is inadequate for assessing PA pressure. Left Atrium: Left atrial size was not well visualized. Right Atrium: Right atrial size was not well visualized. Pericardium: The pericardium was not well visualized. Mitral Valve: The mitral valve was not well visualized. No evidence of mitral valve regurgitation. No evidence of mitral valve stenosis. Tricuspid Valve: The tricuspid valve is not well visualized. Tricuspid valve regurgitation is mild . No evidence of tricuspid stenosis. Aortic Valve: The aortic valve is tricuspid. Aortic valve regurgitation is not visualized. No aortic stenosis is present. Aortic valve mean gradient measures 1.1 mmHg. Aortic valve peak gradient measures 2.6 mmHg. Aortic valve area, by VTI measures 2.09 cm. Pulmonic Valve: The pulmonic valve was not well visualized. Pulmonic valve regurgitation is not visualized. No evidence of pulmonic stenosis. Aorta: The aortic root and ascending aorta are structurally normal, with no evidence of dilitation. Venous: The inferior vena cava is normal in size with greater than 50% respiratory variability, suggesting right atrial pressure of 3 mmHg. IAS/Shunts: The  interatrial septum was not well visualized.  LEFT VENTRICLE PLAX 2D LVIDd:         3.80 cm   Diastology LVIDs:         2.20 cm   LV e' medial:    6.31 cm/s LV PW:         1.30 cm   LV E/e' medial:  10.8 LV IVS:        1.30 cm   LV e' lateral:   3.15 cm/s LVOT diam:     1.90 cm   LV E/e' lateral: 21.6 LV SV:  32 LV SV Index:   14 LVOT Area:     2.84 cm  RIGHT VENTRICLE            IVC RV S prime:     8.16 cm/s  IVC diam: 1.60 cm TAPSE (M-mode): 1.5 cm LEFT ATRIUM             Index        RIGHT ATRIUM           Index LA diam:        3.80 cm 1.70 cm/m   RA Area:     13.00 cm LA Vol (A2C):   33.9 ml 15.14 ml/m  RA Volume:   30.30 ml  13.53 ml/m LA Vol (A4C):   48.3 ml 21.57 ml/m LA Biplane Vol: 42.6 ml 19.03 ml/m  AORTIC VALVE AV Area (Vmax):    2.19 cm AV Area (Vmean):   2.28 cm AV Area (VTI):     2.09 cm AV Vmax:           80.48 cm/s AV Vmean:          48.019 cm/s AV VTI:            0.152 m AV Peak Grad:      2.6 mmHg AV Mean Grad:      1.1 mmHg LVOT Vmax:         62.10 cm/s LVOT Vmean:        38.600 cm/s LVOT VTI:          0.112 m LVOT/AV VTI ratio: 0.74  AORTA Ao Root diam: 3.10 cm Ao Asc diam:  3.10 cm Ao Desc diam: 2.10 cm MV E velocity: 68.10 cm/s MV A velocity: 83.10 cm/s  SHUNTS MV E/A ratio:  0.82        Systemic VTI:  0.11 m                            Systemic Diam: 1.90 cm Carlyle Dolly MD Electronically signed by Carlyle Dolly MD Signature Date/Time: 09/05/2021/12:22:36 PM    Final    US RENAL  Result Date: 09/05/2021 CLINICAL DATA:  Acute renal insufficiency EXAM: RENAL / URINARY TRACT ULTRASOUND COMPLETE COMPARISON:  CT 06/13/2021, sonogram 07/26/2020 FINDINGS: Imaging is limited by the patient's body habitus and relative depth of the kidneys. Right Kidney: Renal measurements: 8.3 x 4.4 x 3.7 cm = volume: 71 mL. The lower pole is obscured by overlying bowel gas. Echogenicity within normal limits. No mass or hydronephrosis visualized. Left Kidney: Renal measurements: 10.9 x 6.1 x 6.2  cm = volume: 214 mL. The upper and lower pole is obscured by overlying bowel gas. Echogenicity within normal limits. No mass or hydronephrosis visualized. Bladder: Not well visualized on the presented images. Other: None. IMPRESSION: Technically limited examination.  No hydronephrosis. Electronically Signed   By: Fidela Salisbury M.D.   On: 09/05/2021 00:12   CT CHEST WO CONTRAST  Result Date: 09/04/2021 CLINICAL DATA:  Positive D-dimer, shortness of breath, hypoxia EXAM: CT CHEST WITHOUT CONTRAST TECHNIQUE: Multidetector CT imaging of the chest was performed following the standard protocol without IV contrast. RADIATION DOSE REDUCTION: This exam was performed according to the departmental dose-optimization program which includes automated exposure control, adjustment of the mA and/or kV according to patient size and/or use of iterative reconstruction technique. COMPARISON:  09/04/2021 FINDINGS: Cardiovascular: Unenhanced imaging of the heart demonstrates cardiomegaly without pericardial effusion. Normal caliber of the thoracic  aorta. Atherosclerosis of the aorta and coronary vasculature. Evaluation of the vascular lumen is limited without IV contrast. Pulmonary emboli cannot be evaluated on unenhanced exam. Mediastinum/Nodes: No enlarged mediastinal or axillary lymph nodes. Thyroid gland, trachea, and esophagus demonstrate no significant findings. Lungs/Pleura: No acute airspace disease, effusion, or pneumothorax. Scattered hypoventilatory changes are seen within the dependent lower lobes. Central airways are patent. Upper Abdomen: No acute abnormality. Musculoskeletal: No acute or destructive bony lesions. Reconstructed images demonstrate no additional findings. IMPRESSION: 1. No acute intrathoracic process. Please note that pulmonary emboli cannot be excluded without intravenous contrast administration. 2. Cardiomegaly. 3. Aortic Atherosclerosis (ICD10-I70.0). Coronary artery atherosclerosis. Electronically  Signed   By: Randa Ngo M.D.   On: 09/04/2021 21:44   DG Chest Port 1 View  Result Date: 09/04/2021 CLINICAL DATA:  Shortness of breath.  Hypoxia. EXAM: PORTABLE CHEST 1 VIEW COMPARISON:  02/01/2021 FINDINGS: Patient is rotated. Lung volumes are low limiting assessment. Stable heart size and mediastinal contours, borderline cardiomegaly. There is mild peribronchial thickening. No focal airspace disease. No pneumothorax or large pleural effusion. IMPRESSION: Low lung volumes with peribronchial thickening that may be pulmonary edema or bronchitis. Electronically Signed   By: Keith Rake M.D.   On: 09/04/2021 19:15     Discharge Exam: Vitals:   09/28/21 0402 09/28/21 0822  BP: (!) 137/51 139/78  Pulse: 93 97  Resp: (!) 23 20  Temp:  98.1 F (36.7 C)  SpO2: 93% 99%   Vitals:   09/27/21 1946 09/27/21 2313 09/28/21 0402 09/28/21 0822  BP: (!) 127/58 104/60 (!) 137/51 139/78  Pulse: 93 91 93 97  Resp: 18 19 (!) 23 20  Temp: 98.1 F (36.7 C) 97.7 F (36.5 C)  98.1 F (36.7 C)  TempSrc: Oral Oral  Axillary  SpO2: 92% 94% 93% 99%  Weight:   133.2 kg   Height:   '5\' 2"'$  (1.575 m)     General: Pt is alert, awake, not in acute distress, super morbidly obese. Cardiovascular: RRR, S1/S2 +, no rubs, no gallops Respiratory: CTA bilaterally, no wheezing, no rhonchi Abdominal: Soft, NT, ND, bowel sounds + Extremities: no edema, no cyanosis    The results of significant diagnostics from this hospitalization (including imaging, microbiology, ancillary and laboratory) are listed below for reference.     Microbiology: No results found for this or any previous visit (from the past 240 hour(s)).   Labs: BNP (last 3 results) Recent Labs    09/04/21 1858 09/08/21 0419  BNP 132.7* 40.0   Basic Metabolic Panel: Recent Labs  Lab 09/26/21 1104 09/27/21 0553 09/27/21 0914 09/27/21 1453 09/27/21 1939 09/28/21 0156 09/28/21 0717  NA 136 138 139 139  --   --  140  K 4.6 5.4* 4.5  4.7 3.5 3.4* 3.8  CL 98 100 100 104  --   --  107  CO2 23 19* 19* 16*  --   --  21*  GLUCOSE 255* 338* 298* 283*  --   --  248*  BUN 50* 61* 59* 57*  --   --  48*  CREATININE 3.03* 3.63* 3.36* 3.04*  --   --  2.44*  CALCIUM 9.8 9.0 9.2 9.0  --   --  8.9   Liver Function Tests: Recent Labs  Lab 09/25/21 2118  AST 21  ALT 18  ALKPHOS 162*  BILITOT 0.7  PROT 8.7*  ALBUMIN 3.8   No results for input(s): "LIPASE", "AMYLASE" in the last 168 hours. No results for  input(s): "AMMONIA" in the last 168 hours. CBC: Recent Labs  Lab 09/25/21 2118 09/25/21 2141 09/26/21 0301  WBC 5.9  --  7.4  NEUTROABS 4.2  --   --   HGB 14.5 17.0* 14.6  HCT 47.2* 50.0* 47.7*  MCV 83.0  --  82.4  PLT 328  --  342   Cardiac Enzymes: No results for input(s): "CKTOTAL", "CKMB", "CKMBINDEX", "TROPONINI" in the last 168 hours. BNP: Invalid input(s): "POCBNP" CBG: Recent Labs  Lab 09/27/21 0753 09/27/21 1137 09/27/21 1632 09/27/21 2107 09/28/21 0609  GLUCAP 334* 283* 260* 247* 260*   D-Dimer No results for input(s): "DDIMER" in the last 72 hours. Hgb A1c No results for input(s): "HGBA1C" in the last 72 hours. Lipid Profile No results for input(s): "CHOL", "HDL", "LDLCALC", "TRIG", "CHOLHDL", "LDLDIRECT" in the last 72 hours. Thyroid function studies No results for input(s): "TSH", "T4TOTAL", "T3FREE", "THYROIDAB" in the last 72 hours.  Invalid input(s): "FREET3" Anemia work up No results for input(s): "VITAMINB12", "FOLATE", "FERRITIN", "TIBC", "IRON", "RETICCTPCT" in the last 72 hours. Urinalysis    Component Value Date/Time   COLORURINE YELLOW 09/04/2021 2346   APPEARANCEUR TURBID (A) 09/04/2021 2346   LABSPEC 1.013 09/04/2021 2346   PHURINE 5.0 09/04/2021 2346   GLUCOSEU NEGATIVE 09/04/2021 2346   HGBUR SMALL (A) 09/04/2021 2346   BILIRUBINUR NEGATIVE 09/04/2021 2346   KETONESUR NEGATIVE 09/04/2021 2346   PROTEINUR >=300 (A) 09/04/2021 2346   UROBILINOGEN 0.2 06/28/2014 1500    NITRITE NEGATIVE 09/04/2021 2346   LEUKOCYTESUR MODERATE (A) 09/04/2021 2346   Sepsis Labs Recent Labs  Lab 09/25/21 2118 09/26/21 0301  WBC 5.9 7.4   Microbiology No results found for this or any previous visit (from the past 240 hour(s)).   Time coordinating discharge: Over 30 minutes  SIGNED:   Darliss Cheney, MD  [facility] 09/28/2021, 10:32 AM *Please note that this is a verbal dictation therefore any spelling or grammatical errors are due to the "[employer] One" system interpretation. If 7PM-7AM, please contact night-coverage [email or web address]

## 2021-09-28 NOTE — TOC Transition Note (Signed)
Transition of Care (TOC) - CM/SW Discharge Note Marvetta Gibbons RN, BSN Transitions of Care Unit 4E- RN Case Manager See Treatment Team for direct phone #    Patient Details  Name: [name] [name] MRN: [id number] Date of Birth: [birth year]  Transition of Care [facility]) CM/SW Contact:  Dawayne Patricia, RN Phone Number: 09/28/2021, 11:23 AM   Clinical Narrative:    Pt stable for transition back home. Pt to transport via [facility].  Verified address w/ pt at bedside.  Pt goes to the [facility] for primary care needs- Dr. Coy Saunas  Per pt she has needed DME at home- is bed bound at baseline and uses hoyer lift.  Followed by [facility]  Aides/Caregivers assist at home via the [employer].   1110- Call made to [facility] for transport - ETA over an hour-several folks ahead on list - advised pt to go ahead and order lunch- bedside RN aware.   Pt also requested assistance w/ home 02 supplies- reports she has home 02 concentrator at home and CPAP and uses PRN-2L- she needs new tubing, etc. Not sure of agency- was set up from hospital a few years ago she says. Will reach out to [employer] to check and see if pt is active w/ them.  Call made to [facility] w/ [employer] - confirmed pt has home 02 w/ [employer] (June 2022) Thedore Mins will work on getting new tubing/nasal cannulas delivered to pt's home.    Final next level of care: Home/Self Care Barriers to Discharge: No Barriers Identified   Patient Goals and CMS Choice Patient states their goals for this hospitalization and ongoing recovery are:: return home/get better   Choice offered to / list presented to : NA  Discharge Placement               Home         Discharge Plan and Services   Discharge Planning Services: CM Consult Post Acute Care Choice: NA          DME Arranged: N/A DME Agency: NA       HH Arranged: NA HH Agency: NA        Social Determinants of Health (SDOH) Interventions     Readmission Risk  Interventions    09/28/2021   11:23 AM 09/08/2021   10:20 AM 02/06/2020    4:40 PM  Readmission Risk Prevention Plan  Transportation Screening Complete Complete Complete  Home Care Screening  Complete   Medication Review (RN CM)  Complete   HRI or Home Care Consult Complete    Social Work Consult for [place] Planning/Counseling Complete    Palliative Care Screening Not Applicable    Medication Review Press photographer) Complete    PCP or Specialist appointment within 3-5 days of discharge   Complete  SW Recovery Care/Counseling Consult   Complete  [place]   Complete

## 2021-09-28 NOTE — Progress Notes (Signed)
Nutrition Consult - Diet Education  RD consulted for nutrition education regarding diabetes.   Lab Results  Component Value Date   HGBA1C 15.1 (H) 09/05/2021    RD provided "Carbohydrate Counting for People with Diabetes" handout from the Academy of Nutrition and Dietetics. Discussed different food groups and their effects on blood sugar, emphasizing carbohydrate-containing foods. Provided list of carbohydrates and recommended serving sizes of common foods.  Discussed importance of controlled and consistent carbohydrate intake throughout the day. Provided examples of ways to balance meals/snacks and encouraged intake of high-fiber, whole grain complex carbohydrates. Teach back method used.  Expect good compliance. Patient would like to have outpatient diet education; RD to order.   Body mass index is 53.71 kg/m. Pt meets criteria for class 3, extreme/morbid obesity based on current BMI.  Current diet order is carbohydrate modified, patient is consuming approximately 100% of meals at this time. Labs and medications reviewed. No further nutrition interventions warranted at this time. RD contact information provided. If additional nutrition issues arise, please re-consult RD.  Lucas Mallow RD, LDN, CNSC Please refer to Amion for contact information.

## 2021-09-28 NOTE — Inpatient Diabetes Management (Signed)
   Please consider obtaining a Beta-hydroxybuterate Inpatient Diabetes Program Recommendations  AACE/ADA: New Consensus Statement on Inpatient Glycemic Control (2015)  Target Ranges:  Prepandial:   less than 140 mg/dL      Peak postprandial:   less than 180 mg/dL (1-2 hours)      Critically ill patients:  140 - 180 mg/dL   Lab Results  Component Value Date   GLUCAP 260 (H) 09/28/2021   HGBA1C 15.1 (H) 09/05/2021    Review of Glycemic Control  Latest Reference Range & Units 09/27/21 07:53 09/27/21 11:37 09/27/21 16:32 09/27/21 21:07 09/28/21 06:09  Glucose-Capillary 70 - 99 mg/dL 334 (H) 283 (H) 260 (H) 247 (H) 260 (H)    Diabetes history: DM   Outpatient Diabetes medications:  Semglee 52 units QAM & 7 units QHS Ozempic 0.25 weekly   Current orders for Inpatient glycemic control:  Semglee 50 units BID Novolog 0-15 units TID & 0-5 QHS Novolog 4 units MC  TID  Inpatient Diabetes Program Recommendations:    -   Consider Increasing Semglee to 55 units bid -   Increase Novolog meal coverage to 6 units tid if eating >50% of meals  Will continue to follow while inpatient.  Thanks, Tama Headings RN, MSN, BC-ADM Inpatient Diabetes Coordinator Team Pager [phone] (8a-5p)

## 2021-11-02 ENCOUNTER — Ambulatory Visit: Payer: No Typology Code available for payment source | Admitting: Nutrition

## 2021-11-18 ENCOUNTER — Other Ambulatory Visit: Payer: Medicare Other | Admitting: Internal Medicine

## 2021-11-18 DIAGNOSIS — G4733 Obstructive sleep apnea (adult) (pediatric): Secondary | ICD-10-CM

## 2021-11-18 DIAGNOSIS — E1165 Type 2 diabetes mellitus with hyperglycemia: Secondary | ICD-10-CM | POA: Diagnosis not present

## 2021-11-18 DIAGNOSIS — M2170 Unequal limb length (acquired), unspecified site: Secondary | ICD-10-CM | POA: Diagnosis not present

## 2021-11-18 DIAGNOSIS — Z515 Encounter for palliative care: Secondary | ICD-10-CM | POA: Diagnosis not present

## 2021-11-18 DIAGNOSIS — Z794 Long term (current) use of insulin: Secondary | ICD-10-CM | POA: Diagnosis not present

## 2021-11-18 NOTE — Progress Notes (Signed)
[facility] Visit Telephone: [phone]  Fax: [phone]   Date of encounter: 11/18/21 7:57 AM PATIENT NAME: [name] [name] [address] [place] [address]   [phone] (home)  DOB: 24-Mar-1955 MRN: [id number] PRIMARY CARE PROVIDER:    Cl[facility], [facility],  [place] [address] [phone]  REFERRING PROVIDER:   [facility] [address] [place],  [place] [address] [phone]  RESPONSIBLE PARTY:    Contact Information     Name Relation Home Work Paia Son [phone]  [phone]   Elson Areas [phone]  [phone]   [place] Sister [phone]  [phone]        I met face to face with patient and family in her home/facility. Palliative Care was asked to follow this patient by consultation request of  [facility] to address advance care planning and complex medical decision making. This is follow-up visit.                                     ASSESSMENT AND PLAN / RECOMMENDATIONS:   Advance Care Planning/Goals of Care: Goals include to maximize quality of life and symptom management. Patient/health care surrogate gave his/her permission to discuss.Our advance care planning conversation included a discussion about:    The value and importance of advance care planning  Experiences with loved ones who have been seriously ill or have died  Exploration of personal, cultural or spiritual beliefs that might influence medical decisions  Exploration of goals of care in the event of a sudden injury or illness  Identification  of a healthcare agent  Review and updating or creation of an  advance directive document . Decision not to resuscitate or to de-escalate disease focused treatments due to poor prognosis. CODE STATUS:  full code remains her desire  Symptom Management/Plan: 1.  Type 2 diabetes mellitus with hyperglycemia, with long-term current use of insulin ([place]) -pt had admission since I last saw her and required adjustment in her insulin--now on 56 units long-acting in am and 16 at hs -has cut out gatorade and only drinking water -has freestyle libre 3 but did not have a portion of device to be able to check her glucose this am while I was there--when it was set up yesterday, it read hi  She also takes ozempic each Thursday Counseled again on diet  2. Moderate obstructive sleep apnea-hypopnea syndrome -has her CPAP and it's working with all pieces present and accounted for  3. Class 3 obesity (HCC) -remains, dietary choices have been poor, libre device may help her make better choices though her mobility remains a mgt limitation  4. Palliative care encounter -discussed with her that our program is changing so I will not be able to visit her any longer and she expressed appreciation and understanding  5.  Left leg short -due to prior fx -pt adamant she wants to learn to walk on this and a PT has told her she needs a specialty shoe--I'm not sure this is a safe option for her even if she did achieve more upper body strength given the still present fx of her left femur -she is requesting ortho referral but going to try to get through [place] first as I suggested   Follow up Palliative Care Visit: Our program is changing such that I will no longer be  visiting with Ms. Rissler.  PPS: 40%  HOSPICE ELIGIBILITY/DIAGNOSIS: TBD  Chief Complaint: Follow-up palliative visit  HISTORY OF PRESENT ILLNESS:  [name] [name] is a 66 y.o. year old female  with morbid obesity, diabetes that is uncontrolled with ckd, prior femoral fx not repaired, osa, among others seen in f/u today .   Hospitalized early august soon after our visit with hyperglycemia to 670, hba1c was 15 with ongoing admitted dietary indiscretions.  Got insulin drip, then transitioned to long-acting with  adjustments to 50 units bid and sent home on 60 units lantus bid.  She also had acute on chronic renal failure with cr p;eaking at 3.36 with 2.21 at admission.  She was discharged at 2.6.  she was to hold her torsemide 3 days and restart.    History obtained from review of EMR, discussion with primary team, and interview with family, facility staff/caregiver and/or Ms. Rigsby.  I reviewed available labs, medications, imaging, studies and related documents from the EMR.  Records reviewed and summarized above.   ROS Review of Systemssee hpi  Physical Exam:  Wt Readings from Last 500 Encounters:  09/28/21 293 lb 10.4 oz (133.2 kg)  09/09/21 282 lb 10.1 oz (128.2 kg)  07/20/21 290 lb (131.5 kg)  06/13/21 294 lb (133.4 kg)  05/12/21 290 lb (131.5 kg)  04/21/21 290 lb (131.5 kg)  02/01/21 299 lb (135.6 kg)  09/08/20 281 lb 15.5 oz (127.9 kg)  08/28/20 286 lb 6 oz (129.9 kg)  08/06/20 (!) 321 lb 3.4 oz (145.7 kg)  07/16/20 (!) 301 lb (136.5 kg)  02/11/20 (!) 314 lb 2.5 oz (142.5 kg)  11/23/19 (!) 396 lb (179.6 kg)  10/23/19 (!) 330 lb 11 oz (150 kg)  10/18/19 (!) 330 lb 11 oz (150 kg)  10/07/19 (!) 330 lb 11 oz (150 kg)  08/20/19 (!) 330 lb 0.5 oz (149.7 kg)  08/16/19 (!) 330 lb (149.7 kg)  05/06/19 (!) 350 lb (158.8 kg)  10/09/18 (!) 312 lb 12.8 oz (141.9 kg)  11/21/16 200 lb (90.7 kg)  02/08/16 200 lb (90.7 kg)  11/25/14 180 lb (81.6 kg)  11/21/14 180 lb (81.6 kg)  08/15/14 176 lb 6.4 oz (80 kg)  07/01/14 170 lb 8 oz (77.3 kg)  10/27/13 180 lb (81.6 kg)   Physical Exam Constitutional:      Appearance: She is obese.  HENT:     Head: Normocephalic and atraumatic.  Cardiovascular:     Rate and Rhythm: Normal rate and regular rhythm.  Pulmonary:     Effort: Pulmonary effort is normal.     Breath sounds: Normal breath sounds.  Abdominal:     General: Bowel sounds are normal.     Palpations: Abdomen is soft.  Musculoskeletal:        General: Normal range of motion.      Comments: Left short rotated leg  Skin:    General: Skin is warm and dry.  Neurological:     General: No focal deficit present.     Mental Status: She is alert and oriented to person, place, and time.  Psychiatric:        Mood and Affect: Mood normal.     CURRENT PROBLEM LIST:  Patient Active Problem List   Diagnosis Date Noted   Hyperglycemia due to type 2 diabetes mellitus ([place]) 09/26/2021   Type 2 diabetes mellitus with hyperglycemia, with long-term current use of insulin ([place]) 09/25/2021   (HFpEF) heart failure with preserved ejection fraction ([place])  09/25/2021   Hypomagnesemia 09/08/2021   Severe sepsis ([place]) 09/07/2021   Bacteremia due to Klebsiella pneumoniae 09/07/2021   Urinary tract infection 09/07/2021   Acute respiratory failure with hypoxia and hypercapnia ([place]) 09/04/2021   DKA (diabetic ketoacidosis) ([place]) 02/02/2021   Class 3 obesity ([place]) 02/02/2021   Hyperglycemia 02/01/2021   Pancreatitis 09/07/2020   Intractable nausea and vomiting 09/07/2020   Functional quadriplegia ([place]) 09/07/2020   Physical debility 08/26/2020   OSA on CPAP 08/26/2020   Constipation    Acute renal failure superimposed on stage 3a chronic kidney disease, unspecified acute renal failure type ([place]) 08/25/2020   Acute renal failure superimposed on stage 3b chronic kidney disease ([place]) 07/30/2020   Lower extremity cellulitis 02/04/2020   Acute on chronic diastolic CHF (congestive heart failure) ([place]) 11/23/2019   Vaginal discharge 11/06/2019   Morbid obesity ([place]) 08/22/2019   Opiate overdose ([place]) 08/20/2019   Cellulitis 10/05/2018   Acute cystitis without hematuria 10/05/2018   Essential hypertension 10/05/2018   Pressure injury of skin 10/05/2018   Hypoglycemia secondary to sulfonylurea 11/21/2016   Substance induced mood disorder ([place]) 07/02/2014   Encephalopathy    Hyperkalemia    Hypokalemia    Type 2 diabetes mellitus with stage 3 chronic kidney disease ([place])    Hyperlipidemia  associated with type 2 diabetes mellitus ([place])    Anxiety and depression    Anxiety state     PAST MEDICAL HISTORY:  Active Ambulatory Problems    Diagnosis Date Noted   Encephalopathy    Hyperkalemia    Hypokalemia    Type 2 diabetes mellitus with stage 3 chronic kidney disease ([place])    Hyperlipidemia associated with type 2 diabetes mellitus ([place])    Anxiety and depression    Anxiety state    Substance induced mood disorder ([place]) 07/02/2014   Hypoglycemia secondary to sulfonylurea 11/21/2016   Cellulitis 10/05/2018   Acute cystitis without hematuria 10/05/2018   Essential hypertension 10/05/2018   Pressure injury of skin 10/05/2018   Opiate overdose ([place]) 08/20/2019   Morbid obesity ([place]) 08/22/2019   Vaginal discharge 11/06/2019   Acute on chronic diastolic CHF (congestive heart failure) ([place]) 11/23/2019   Lower extremity cellulitis 02/04/2020   Acute renal failure superimposed on stage 3b chronic kidney disease ([place]) 07/30/2020   Acute renal failure superimposed on stage 3a chronic kidney disease, unspecified acute renal failure type ([place]) 08/25/2020   Physical debility 08/26/2020   OSA on CPAP 08/26/2020   Constipation    Pancreatitis 09/07/2020   Intractable nausea and vomiting 09/07/2020   Functional quadriplegia ([place]) 09/07/2020   Hyperglycemia 02/01/2021   DKA (diabetic ketoacidosis) ([place]) 02/02/2021   Class 3 obesity ([place]) 02/02/2021   Acute respiratory failure with hypoxia and hypercapnia ([place]) 09/04/2021   Severe sepsis ([place]) 09/07/2021   Bacteremia due to Klebsiella pneumoniae 09/07/2021   Urinary tract infection 09/07/2021   Hypomagnesemia 09/08/2021   Type 2 diabetes mellitus with hyperglycemia, with long-term current use of insulin ([place]) 09/25/2021   (HFpEF) heart failure with preserved ejection fraction ([place]) 09/25/2021   Hyperglycemia due to type 2 diabetes mellitus ([place]) 09/26/2021   Resolved Ambulatory Problems    Diagnosis Date Noted   Metabolic  encephalopathy    GI bleeding 10/19/2019   Anemia associated with acute blood loss 11/11/2019   Hemorrhagic shock ([place]) 11/27/2019   Past Medical History:  Diagnosis Date   Diabetes mellitus without complication (HCC)    Hypertension    Knee pain, chronic    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[facility])     SOCIAL HX:  Social History   Tobacco Use   Smoking status: Former   Smokeless tobacco: Never  Substance Use Topics   Alcohol use: Yes    Alcohol/week: 0.0 standard drinks of alcohol    Comment: socially     ALLERGIES:  Allergies  Allergen Reactions   Lisinopril Swelling   Morphine Itching and Rash   Penicillins Hives, Itching, Nausea And Vomiting and Rash    Has patient had a PCN reaction causing immediate rash, facial/tongue/throat swelling, SOB or lightheadedness with hypotension: No Has patient had a PCN reaction causing severe rash involving mucus membranes or skin necrosis: No Has patient had a PCN reaction that required hospitalization: No Has patient had a PCN reaction occurring within the last 10 years: No If all of the above answers are "NO", then may proceed with Cephalosporin use.       PERTINENT MEDICATIONS:  Outpatient Encounter Medications as of 11/18/2021  Medication Sig   acetaminophen (TYLENOL) 500 MG tablet Take 500 mg by mouth every 6 (six) hours as needed.   albuterol (VENTOLIN HFA) 108 (90 Base) MCG/ACT inhaler Inhale 2 puffs into the lungs every 6 (six) hours as needed for wheezing or shortness of breath.   atorvastatin (LIPITOR) 10 MG tablet Take 10 mg by mouth at bedtime.   cetirizine (ZYRTEC) 10 MG tablet Take 10 mg by mouth daily.   escitalopram (LEXAPRO) 10 MG tablet Take 1 tablet (10 mg total) by mouth every morning.   insulin glargine-yfgn (SEMGLEE) 100 UNIT/ML Pen Inject 60 Units into the skin 2 (two) times daily.  52 units in the morning and 7 units at bedtime   Insulin Pen Needle 31G X 5 MM MISC Use as directed daily   ketotifen (ZADITOR) 0.025 %  ophthalmic solution Place 1 drop into both eyes 2 (two) times daily as needed. (Patient taking differently: Place 1 drop into both eyes 2 (two) times daily as needed (allergies, itching).)   mirtazapine (REMERON) 15 MG tablet Take 15 mg by mouth at bedtime as needed (sleep).   nystatin (MYCOSTATIN/NYSTOP) powder Apply to affected area 2 (two) times daily to groin and areas of fungal infection   nystatin cream (MYCOSTATIN) Apply 1 Application topically 2 (two) times daily.   pantoprazole (PROTONIX) 40 MG tablet Take 40 mg by mouth daily before breakfast.   potassium chloride SA (KLOR-CON M) 20 MEQ tablet Take 1 tablet (20 mEq total) by mouth daily.   pregabalin (LYRICA) 75 MG capsule Take 75 mg by mouth 2 (two) times daily.   Semaglutide,0.25 or 0.5MG/DOS, 2 MG/1.5ML SOPN Inject 2 mg into the skin every Thursday.   torsemide (DEMADEX) 20 MG tablet Take 2 tablets (40 mg total) by mouth daily.   No facility-administered encounter medications on file as of 11/18/2021.    Thank you for the opportunity to participate in the care of Ms. Seiple.  The palliative care team will continue to follow. Please call our office at [phone] if we can be of additional assistance.   Hollace Kinnier, DO  COVID-19 PATIENT SCREENING TOOL Asked and negative response unless otherwise noted:  Have you had symptoms of covid, tested positive or been in contact with someone with symptoms/positive test in the past 5-10 days? No

## 2021-12-01 ENCOUNTER — Emergency Department (HOSPITAL_COMMUNITY)
Admission: EM | Admit: 2021-12-01 | Discharge: 2021-12-01 | Disposition: A | Discharge status: Home / Self Care | Payer: Medicare Other | Attending: Emergency Medicine | Admitting: Emergency Medicine

## 2021-12-01 ENCOUNTER — Other Ambulatory Visit: Payer: Self-pay

## 2021-12-01 ENCOUNTER — Encounter (HOSPITAL_COMMUNITY): Payer: Self-pay | Admitting: Oncology

## 2021-12-01 DIAGNOSIS — Z794 Long term (current) use of insulin: Secondary | ICD-10-CM | POA: Diagnosis not present

## 2021-12-01 DIAGNOSIS — E1165 Type 2 diabetes mellitus with hyperglycemia: Secondary | ICD-10-CM | POA: Diagnosis not present

## 2021-12-01 DIAGNOSIS — Z743 Need for continuous supervision: Secondary | ICD-10-CM | POA: Diagnosis not present

## 2021-12-01 DIAGNOSIS — R739 Hyperglycemia, unspecified: Secondary | ICD-10-CM | POA: Diagnosis not present

## 2021-12-01 DIAGNOSIS — R531 Weakness: Secondary | ICD-10-CM | POA: Diagnosis not present

## 2021-12-01 DIAGNOSIS — Z7401 Bed confinement status: Secondary | ICD-10-CM | POA: Diagnosis not present

## 2021-12-01 LAB — CBC WITH DIFFERENTIAL/PLATELET
Abs Immature Granulocytes: 0.02 10*3/uL (ref 0.00–0.07)
Basophils Absolute: 0 10*3/uL (ref 0.0–0.1)
Basophils Relative: 0 %
Eosinophils Absolute: 0.2 10*3/uL (ref 0.0–0.5)
Eosinophils Relative: 2 %
HCT: 49.4 % — ABNORMAL HIGH (ref 36.0–46.0)
Hemoglobin: 14.9 g/dL (ref 12.0–15.0)
Immature Granulocytes: 0 %
Lymphocytes Relative: 24 %
Lymphs Abs: 1.8 10*3/uL (ref 0.7–4.0)
MCH: 25.5 pg — ABNORMAL LOW (ref 26.0–34.0)
MCHC: 30.2 g/dL (ref 30.0–36.0)
MCV: 84.6 fL (ref 80.0–100.0)
Monocytes Absolute: 0.7 10*3/uL (ref 0.1–1.0)
Monocytes Relative: 9 %
Neutro Abs: 4.9 10*3/uL (ref 1.7–7.7)
Neutrophils Relative %: 65 %
Platelets: 276 10*3/uL (ref 150–400)
RBC: 5.84 MIL/uL — ABNORMAL HIGH (ref 3.87–5.11)
RDW: 15.2 % (ref 11.5–15.5)
WBC: 7.5 10*3/uL (ref 4.0–10.5)
nRBC: 0 % (ref 0.0–0.2)

## 2021-12-01 LAB — CBG MONITORING, ED
Glucose-Capillary: 481 mg/dL — ABNORMAL HIGH (ref 70–99)
Glucose-Capillary: 373 mg/dL — ABNORMAL HIGH (ref 70–99)

## 2021-12-01 LAB — BASIC METABOLIC PANEL
Anion gap: 10 (ref 5–15)
BUN: 57 mg/dL — ABNORMAL HIGH (ref 8–23)
CO2: 29 mmol/L (ref 22–32)
Calcium: 9.7 mg/dL (ref 8.9–10.3)
Chloride: 98 mmol/L (ref 98–111)
Creatinine, Ser: 2.13 mg/dL — ABNORMAL HIGH (ref 0.44–1.00)
GFR, Estimated: 25 mL/min — ABNORMAL LOW (ref >60)
Glucose, Bld: 464 mg/dL — ABNORMAL HIGH (ref 70–99)
Potassium: 4.3 mmol/L (ref 3.5–5.1)
Sodium: 137 mmol/L (ref 135–145)

## 2021-12-01 MED ORDER — INSULIN ASPART 100 UNIT/ML IJ SOLN
10.0000 [IU] | Freq: Once | INTRAMUSCULAR | Status: AC
  Administered 2021-12-01: 10 [IU] via SUBCUTANEOUS
  Filled 2021-12-01: qty 0.1

## 2021-12-01 MED ORDER — SODIUM CHLORIDE 0.9 % IV BOLUS
1000.0000 mL | Freq: Once | INTRAVENOUS | Status: AC
  Administered 2021-12-01: 1000 mL via INTRAVENOUS

## 2021-12-01 NOTE — ED Provider Notes (Signed)
[facility] DEPT Provider Note   CSN: [id number] Arrival date & time: 12/01/21  1515     History  Chief Complaint  Patient presents with   Hyperglycemia    [name] [name] is a 66 y.o. female.  She had a visiting nurse stop by today and check some blood work and found her sugar to be high.  She said she had not taken her morning insulin yet and took it with the ambulance crew that brought her in around 2 PM.  She does endorse some blurry vision and knows her sugar is high but states it usually runs in the 400s.  No abdominal pain vomiting diarrhea.  No headache.  She otherwise feels at baseline.  The history is provided by the patient.  Hyperglycemia Blood sugar level PTA:  High Severity:  Severe Progression:  Improving Chronicity:  Recurrent Diabetes status:  Controlled with insulin Relieved by:  Insulin Ineffective treatments:  None tried Associated symptoms: blurred vision and dehydration   Associated symptoms: no abdominal pain, no altered mental status, no chest pain, no fever, no nausea, no shortness of breath and no vomiting   Risk factors: obesity        Home Medications Prior to Admission medications   Medication Sig Start Date End Date Taking? Authorizing Provider  acetaminophen (TYLENOL) 500 MG tablet Take 500 mg by mouth every 6 (six) hours as needed. 07/24/20   [provider]  albuterol (VENTOLIN HFA) 108 (90 Base) MCG/ACT inhaler Inhale 2 puffs into the lungs every 6 (six) hours as needed for wheezing or shortness of breath. 08/06/20   Aline August, MD  atorvastatin (LIPITOR) 10 MG tablet Take 10 mg by mouth at bedtime.    [provider]  cetirizine (ZYRTEC) 10 MG tablet Take 10 mg by mouth daily. 11/20/20   [provider]  escitalopram (LEXAPRO) 10 MG tablet Take 1 tablet (10 mg total) by mouth every morning. 08/01/21   Reed, Tiffany L, DO  insulin glargine-yfgn (SEMGLEE) 100 UNIT/ML Pen Inject 60  Units into the skin 2 (two) times daily.  52 units in the morning and 7 units at bedtime 09/28/21   Darliss Cheney, MD  Insulin Pen Needle 31G X 5 MM MISC Use as directed daily 02/03/21   Dessa Phi, DO  ketotifen (ZADITOR) 0.025 % ophthalmic solution Place 1 drop into both eyes 2 (two) times daily as needed. Patient taking differently: Place 1 drop into both eyes 2 (two) times daily as needed (allergies, itching). 09/09/21   Mercy Riding, MD  mirtazapine (REMERON) 15 MG tablet Take 15 mg by mouth at bedtime as needed (sleep). 11/06/20   [provider]  nystatin (MYCOSTATIN/NYSTOP) powder Apply to affected area 2 (two) times daily to groin and areas of fungal infection 02/03/21   Dessa Phi, DO  nystatin cream (MYCOSTATIN) Apply 1 Application topically 2 (two) times daily. 05/10/21   [provider]  pantoprazole (PROTONIX) 40 MG tablet Take 40 mg by mouth daily before breakfast.    [provider]  potassium chloride SA (KLOR-CON M) 20 MEQ tablet Take 1 tablet (20 mEq total) by mouth daily. 09/09/21   Mercy Riding, MD  pregabalin (LYRICA) 75 MG capsule Take 75 mg by mouth 2 (two) times daily.    [provider]  Semaglutide,0.25 or 0.'5MG'$ /DOS, 2 MG/1.5ML SOPN Inject 2 mg into the skin every Thursday. 02/24/21   [provider]  torsemide (DEMADEX) 20 MG tablet Take 2 tablets (  40 mg total) by mouth daily. 10/01/21   Darliss Cheney, MD      Allergies    Lisinopril, Morphine, and Penicillins    Review of Systems   Review of Systems  Constitutional:  Negative for fever.  Eyes:  Positive for blurred vision and visual disturbance.  Respiratory:  Negative for shortness of breath.   Cardiovascular:  Negative for chest pain.  Gastrointestinal:  Negative for abdominal pain, nausea and vomiting.    Physical Exam Updated Vital Signs BP (!) 143/75   Pulse 85   Temp 97.6 F (36.4 C) (Oral)   Resp 20   Ht '5\' 2"'$  (1.575 m)   Wt 136.1 kg   SpO2 (!) 86%    BMI 54.87 kg/m  Physical Exam Vitals and nursing note reviewed.  Constitutional:      General: She is not in acute distress.    Appearance: Normal appearance. She is well-developed. She is obese.  HENT:     Head: Normocephalic and atraumatic.  Eyes:     Conjunctiva/sclera: Conjunctivae normal.  Cardiovascular:     Rate and Rhythm: Normal rate and regular rhythm.     Heart sounds: No murmur heard. Pulmonary:     Effort: Pulmonary effort is normal. No respiratory distress.     Breath sounds: Normal breath sounds.  Abdominal:     Palpations: Abdomen is soft.     Tenderness: There is no abdominal tenderness.  Musculoskeletal:     Cervical back: Neck supple.  Skin:    General: Skin is warm and dry.     Capillary Refill: Capillary refill takes less than 2 seconds.  Neurological:     General: No focal deficit present.     Mental Status: She is alert.     ED Results / Procedures / Treatments   Labs (all labs ordered are listed, but only abnormal results are displayed) Labs Reviewed  BASIC METABOLIC PANEL - Abnormal; Notable for the following components:      Result Value   Glucose, Bld 464 (*)    BUN 57 (*)    Creatinine, Ser 2.13 (*)    GFR, Estimated 25 (*)    All other components within normal limits  CBC WITH DIFFERENTIAL/PLATELET - Abnormal; Notable for the following components:   RBC 5.84 (*)    HCT 49.4 (*)    MCH 25.5 (*)    All other components within normal limits  CBG MONITORING, ED - Abnormal; Notable for the following components:   Glucose-Capillary 481 (*)    All other components within normal limits  CBG MONITORING, ED - Abnormal; Notable for the following components:   Glucose-Capillary 373 (*)    All other components within normal limits    EKG None  Radiology No results found.  Procedures Procedures    Medications Ordered in ED Medications  sodium chloride 0.9 % bolus 1,000 mL (has no administration in time range)    ED Course/ Medical  Decision Making/ A&P                           Medical Decision Making Amount and/or Complexity of Data Reviewed Labs: ordered.  Risk Prescription drug management.   This patient complains of elevated blood sugar; this involves an extensive number of treatment Options and is a complaint that carries with it a high risk of complications and morbidity. The differential includes noncompliance, hyperglycemia, dehydration, infection, DKA  I ordered, reviewed and interpreted labs,  which included CBC with normal white count normal hemoglobin, chemistries with elevated blood sugar elevated creatinine better than priors, normal anion gap I ordered medication IV fluids subcu insulin and reviewed PMP when indicated. Previous records obtained and reviewed in epic, including recent ED visits for similar presentations Cardiac monitoring reviewed, normal sinus rhythm Social determinants considered, no significant barriers Critical Interventions: None  After the interventions stated above, I reevaluated the patient and found patient fairly asymptomatic.  Feels better after fluids and with lower glucose Admission and further testing considered, no indications for admission at this time.  Patient waiting on ambulance transport back home         Final Clinical Impression(s) / ED Diagnoses Final diagnoses:  Hyperglycemia    Rx / DC Orders ED Discharge Orders     None         Hayden Rasmussen, MD 12/02/21 [id number]

## 2021-12-01 NOTE — ED Triage Notes (Signed)
Pt bib [facility] d/t doctor order. Per pt her CBG is usually in the 400's. Today home health RN came to give flu shot and draw blood. Glucose was in the 500's however pt had not taken her 56 units of insulin prior to blood draw. EMS gave 56 units in the field, CBG returned to 400's. Pt has no c/o at this time.

## 2021-12-01 NOTE — Discharge Instructions (Signed)
You are seen in the emergency department for elevated blood sugar.  You were given some insulin with improvement in your blood sugar.  Please continue your regular medications and follow your diabetic diet.  Follow-up with your treatment team.  Return to the emergency department if any worsening or concerning symptoms.

## 2022-01-19 ENCOUNTER — Ambulatory Visit: Payer: No Typology Code available for payment source | Admitting: Dermatology

## 2022-05-09 ENCOUNTER — Other Ambulatory Visit (HOSPITAL_COMMUNITY): Payer: Self-pay

## 2022-10-02 ENCOUNTER — Inpatient Hospital Stay (HOSPITAL_COMMUNITY)
Admission: EM | Admit: 2022-10-02 | Discharge: 2022-10-07 | DRG: 392 | Disposition: A | Discharge status: Home / Self Care | Payer: No Typology Code available for payment source | Attending: Internal Medicine | Admitting: Internal Medicine

## 2022-10-02 ENCOUNTER — Emergency Department (HOSPITAL_COMMUNITY): Payer: No Typology Code available for payment source

## 2022-10-02 ENCOUNTER — Other Ambulatory Visit: Payer: Self-pay

## 2022-10-02 ENCOUNTER — Encounter (HOSPITAL_COMMUNITY): Payer: Self-pay

## 2022-10-02 DIAGNOSIS — E1165 Type 2 diabetes mellitus with hyperglycemia: Secondary | ICD-10-CM | POA: Diagnosis present

## 2022-10-02 DIAGNOSIS — Z87891 Personal history of nicotine dependence: Secondary | ICD-10-CM

## 2022-10-02 DIAGNOSIS — Z79899 Other long term (current) drug therapy: Secondary | ICD-10-CM

## 2022-10-02 DIAGNOSIS — Z888 Allergy status to other drugs, medicaments and biological substances status: Secondary | ICD-10-CM

## 2022-10-02 DIAGNOSIS — Z885 Allergy status to narcotic agent status: Secondary | ICD-10-CM

## 2022-10-02 DIAGNOSIS — E1122 Type 2 diabetes mellitus with diabetic chronic kidney disease: Secondary | ICD-10-CM | POA: Diagnosis present

## 2022-10-02 DIAGNOSIS — Z833 Family history of diabetes mellitus: Secondary | ICD-10-CM

## 2022-10-02 DIAGNOSIS — Z880 Allergy status to penicillin: Secondary | ICD-10-CM

## 2022-10-02 DIAGNOSIS — E1140 Type 2 diabetes mellitus with diabetic neuropathy, unspecified: Secondary | ICD-10-CM | POA: Diagnosis present

## 2022-10-02 DIAGNOSIS — E1169 Type 2 diabetes mellitus with other specified complication: Secondary | ICD-10-CM | POA: Diagnosis present

## 2022-10-02 DIAGNOSIS — R112 Nausea with vomiting, unspecified: Principal | ICD-10-CM | POA: Diagnosis present

## 2022-10-02 DIAGNOSIS — T364X5A Adverse effect of tetracyclines, initial encounter: Secondary | ICD-10-CM | POA: Diagnosis present

## 2022-10-02 DIAGNOSIS — J9611 Chronic respiratory failure with hypoxia: Secondary | ICD-10-CM | POA: Diagnosis present

## 2022-10-02 DIAGNOSIS — N179 Acute kidney failure, unspecified: Secondary | ICD-10-CM | POA: Diagnosis present

## 2022-10-02 DIAGNOSIS — Z1152 Encounter for screening for COVID-19: Secondary | ICD-10-CM

## 2022-10-02 DIAGNOSIS — I5032 Chronic diastolic (congestive) heart failure: Secondary | ICD-10-CM | POA: Diagnosis present

## 2022-10-02 DIAGNOSIS — Z794 Long term (current) use of insulin: Secondary | ICD-10-CM

## 2022-10-02 DIAGNOSIS — N183 Chronic kidney disease, stage 3 unspecified: Secondary | ICD-10-CM | POA: Diagnosis present

## 2022-10-02 DIAGNOSIS — N1831 Chronic kidney disease, stage 3a: Secondary | ICD-10-CM | POA: Insufficient documentation

## 2022-10-02 DIAGNOSIS — E785 Hyperlipidemia, unspecified: Secondary | ICD-10-CM | POA: Diagnosis present

## 2022-10-02 DIAGNOSIS — I130 Hypertensive heart and chronic kidney disease with heart failure and stage 1 through stage 4 chronic kidney disease, or unspecified chronic kidney disease: Secondary | ICD-10-CM | POA: Diagnosis present

## 2022-10-02 DIAGNOSIS — E119 Type 2 diabetes mellitus without complications: Secondary | ICD-10-CM | POA: Diagnosis present

## 2022-10-02 DIAGNOSIS — Z6841 Body Mass Index (BMI) 40.0 and over, adult: Secondary | ICD-10-CM

## 2022-10-02 DIAGNOSIS — Z9981 Dependence on supplemental oxygen: Secondary | ICD-10-CM

## 2022-10-02 DIAGNOSIS — E860 Dehydration: Secondary | ICD-10-CM

## 2022-10-02 DIAGNOSIS — F419 Anxiety disorder, unspecified: Secondary | ICD-10-CM | POA: Diagnosis present

## 2022-10-02 DIAGNOSIS — N1832 Chronic kidney disease, stage 3b: Secondary | ICD-10-CM | POA: Diagnosis not present

## 2022-10-02 DIAGNOSIS — I5030 Unspecified diastolic (congestive) heart failure: Secondary | ICD-10-CM | POA: Diagnosis present

## 2022-10-02 DIAGNOSIS — G4733 Obstructive sleep apnea (adult) (pediatric): Secondary | ICD-10-CM | POA: Diagnosis present

## 2022-10-02 LAB — CBC WITH DIFFERENTIAL/PLATELET
Abs Immature Granulocytes: 0.03 10*3/uL (ref 0.00–0.07)
Basophils Absolute: 0 10*3/uL (ref 0.0–0.1)
Basophils Relative: 0 %
Eosinophils Absolute: 0.1 10*3/uL (ref 0.0–0.5)
Eosinophils Relative: 1 %
HCT: 56.9 % — ABNORMAL HIGH (ref 36.0–46.0)
Hemoglobin: 16.2 g/dL — ABNORMAL HIGH (ref 12.0–15.0)
Immature Granulocytes: 0 %
Lymphocytes Relative: 15 %
Lymphs Abs: 1.4 10*3/uL (ref 0.7–4.0)
MCH: 24 pg — ABNORMAL LOW (ref 26.0–34.0)
MCHC: 28.5 g/dL — ABNORMAL LOW (ref 30.0–36.0)
MCV: 84.3 fL (ref 80.0–100.0)
Monocytes Absolute: 0.5 10*3/uL (ref 0.1–1.0)
Monocytes Relative: 6 %
Neutro Abs: 6.8 10*3/uL (ref 1.7–7.7)
Neutrophils Relative %: 78 %
Platelets: 441 10*3/uL — ABNORMAL HIGH (ref 150–400)
RBC: 6.75 MIL/uL — ABNORMAL HIGH (ref 3.87–5.11)
RDW: 19.5 % — ABNORMAL HIGH (ref 11.5–15.5)
WBC: 8.9 10*3/uL (ref 4.0–10.5)
nRBC: 0 % (ref 0.0–0.2)

## 2022-10-02 LAB — TROPONIN I (HIGH SENSITIVITY)
Troponin I (High Sensitivity): 9 ng/L (ref <18)
Troponin I (High Sensitivity): 8 ng/L (ref <18)

## 2022-10-02 LAB — COMPREHENSIVE METABOLIC PANEL
ALT: 16 U/L (ref 0–44)
AST: 17 U/L (ref 15–41)
Albumin: 3.4 g/dL — ABNORMAL LOW (ref 3.5–5.0)
Alkaline Phosphatase: 161 U/L — ABNORMAL HIGH (ref 38–126)
Anion gap: 11 (ref 5–15)
BUN: 26 mg/dL — ABNORMAL HIGH (ref 8–23)
CO2: 30 mmol/L (ref 22–32)
Calcium: 9 mg/dL (ref 8.9–10.3)
Chloride: 103 mmol/L (ref 98–111)
Creatinine, Ser: 1.57 mg/dL — ABNORMAL HIGH (ref 0.44–1.00)
GFR, Estimated: 35 mL/min — ABNORMAL LOW (ref >60)
Glucose, Bld: 135 mg/dL — ABNORMAL HIGH (ref 70–99)
Potassium: 4.3 mmol/L (ref 3.5–5.1)
Sodium: 144 mmol/L (ref 135–145)
Total Bilirubin: 0.7 mg/dL (ref 0.3–1.2)
Total Protein: 8.5 g/dL — ABNORMAL HIGH (ref 6.5–8.1)

## 2022-10-02 LAB — LIPASE, BLOOD: Lipase: 56 U/L — ABNORMAL HIGH (ref 11–51)

## 2022-10-02 LAB — GLUCOSE, CAPILLARY: Glucose-Capillary: 138 mg/dL — ABNORMAL HIGH (ref 70–99)

## 2022-10-02 MED ORDER — FAMOTIDINE IN NACL 20-0.9 MG/50ML-% IV SOLN
20.0000 mg | Freq: Once | INTRAVENOUS | Status: AC
  Administered 2022-10-02: 20 mg via INTRAVENOUS
  Filled 2022-10-02: qty 50

## 2022-10-02 MED ORDER — ONDANSETRON HCL 4 MG/2ML IJ SOLN
4.0000 mg | Freq: Four times a day (QID) | INTRAMUSCULAR | Status: DC | PRN
  Administered 2022-10-02 – 2022-10-03 (×3): 4 mg via INTRAVENOUS
  Filled 2022-10-02 (×3): qty 2

## 2022-10-02 MED ORDER — SODIUM CHLORIDE 0.9 % IV BOLUS
1000.0000 mL | Freq: Once | INTRAVENOUS | Status: AC
  Administered 2022-10-02: 1000 mL via INTRAVENOUS

## 2022-10-02 MED ORDER — DIPHENHYDRAMINE HCL 50 MG/ML IJ SOLN
50.0000 mg | Freq: Once | INTRAMUSCULAR | Status: AC
  Administered 2022-10-02: 50 mg via INTRAVENOUS
  Filled 2022-10-02: qty 1

## 2022-10-02 MED ORDER — ALBUTEROL SULFATE HFA 108 (90 BASE) MCG/ACT IN AERS: 2.0000 | INHALATION_SPRAY | Freq: Four times a day (QID) | RESPIRATORY_TRACT | Status: DC | PRN

## 2022-10-02 MED ORDER — IOHEXOL 300 MG/ML  SOLN: 80.0000 mL | Freq: Once | INTRAMUSCULAR | Status: DC | PRN

## 2022-10-02 MED ORDER — ATORVASTATIN CALCIUM 10 MG PO TABS
10.0000 mg | ORAL_TABLET | Freq: Every day | ORAL | Status: DC
  Administered 2022-10-02 – 2022-10-06 (×5): 10 mg via ORAL
  Filled 2022-10-02 (×5): qty 1

## 2022-10-02 MED ORDER — LORATADINE 10 MG PO TABS
10.0000 mg | ORAL_TABLET | Freq: Every day | ORAL | Status: DC
  Administered 2022-10-03 – 2022-10-07 (×5): 10 mg via ORAL
  Filled 2022-10-02 (×5): qty 1

## 2022-10-02 MED ORDER — MIRTAZAPINE 15 MG PO TABS
15.0000 mg | ORAL_TABLET | Freq: Every evening | ORAL | Status: DC | PRN
  Administered 2022-10-04: 15 mg via ORAL
  Filled 2022-10-02: qty 1

## 2022-10-02 MED ORDER — ENOXAPARIN SODIUM 40 MG/0.4ML IJ SOSY: 40.0000 mg | PREFILLED_SYRINGE | INTRAMUSCULAR | Status: DC

## 2022-10-02 MED ORDER — PANTOPRAZOLE SODIUM 40 MG PO TBEC
40.0000 mg | DELAYED_RELEASE_TABLET | Freq: Every day | ORAL | Status: DC
  Administered 2022-10-03 – 2022-10-07 (×5): 40 mg via ORAL
  Filled 2022-10-02 (×5): qty 1

## 2022-10-02 MED ORDER — ENOXAPARIN SODIUM 60 MG/0.6ML IJ SOSY
60.0000 mg | PREFILLED_SYRINGE | Freq: Every day | INTRAMUSCULAR | Status: DC
  Administered 2022-10-02 – 2022-10-06 (×5): 60 mg via SUBCUTANEOUS
  Filled 2022-10-02 (×6): qty 0.6

## 2022-10-02 MED ORDER — VITAMIN D 25 MCG (1000 UNIT) PO TABS
1000.0000 [IU] | ORAL_TABLET | Freq: Every day | ORAL | Status: DC
  Administered 2022-10-03 – 2022-10-07 (×5): 1000 [IU] via ORAL
  Filled 2022-10-02 (×5): qty 1

## 2022-10-02 MED ORDER — PREGABALIN 75 MG PO CAPS
75.0000 mg | ORAL_CAPSULE | Freq: Two times a day (BID) | ORAL | Status: DC
  Administered 2022-10-02 – 2022-10-07 (×10): 75 mg via ORAL
  Filled 2022-10-02 (×10): qty 1

## 2022-10-02 MED ORDER — ACETAMINOPHEN 500 MG PO TABS
500.0000 mg | ORAL_TABLET | Freq: Four times a day (QID) | ORAL | Status: DC | PRN
  Administered 2022-10-04: 500 mg via ORAL
  Filled 2022-10-02: qty 1

## 2022-10-02 MED ORDER — HYDROXYZINE HCL 25 MG PO TABS
25.0000 mg | ORAL_TABLET | Freq: Two times a day (BID) | ORAL | Status: DC
  Administered 2022-10-02 – 2022-10-07 (×10): 25 mg via ORAL
  Filled 2022-10-02 (×10): qty 1

## 2022-10-02 MED ORDER — METOCLOPRAMIDE HCL 5 MG/ML IJ SOLN
10.0000 mg | Freq: Once | INTRAMUSCULAR | Status: AC
  Administered 2022-10-02: 10 mg via INTRAVENOUS
  Filled 2022-10-02: qty 2

## 2022-10-02 MED ORDER — ONDANSETRON HCL 4 MG/2ML IJ SOLN
4.0000 mg | Freq: Once | INTRAMUSCULAR | Status: AC
  Administered 2022-10-02: 4 mg via INTRAVENOUS
  Filled 2022-10-02: qty 2

## 2022-10-02 MED ORDER — INSULIN ASPART 100 UNIT/ML IJ SOLN
0.0000 [IU] | Freq: Three times a day (TID) | INTRAMUSCULAR | Status: DC
  Administered 2022-10-02 – 2022-10-04 (×4): 3 [IU] via SUBCUTANEOUS
  Administered 2022-10-04: 4 [IU] via SUBCUTANEOUS
  Administered 2022-10-05: 3 [IU] via SUBCUTANEOUS
  Administered 2022-10-05: 4 [IU] via SUBCUTANEOUS
  Administered 2022-10-05 – 2022-10-06 (×2): 3 [IU] via SUBCUTANEOUS
  Administered 2022-10-06: 4 [IU] via SUBCUTANEOUS
  Administered 2022-10-06: 7 [IU] via SUBCUTANEOUS
  Administered 2022-10-06: 4 [IU] via SUBCUTANEOUS
  Administered 2022-10-07: 3 [IU] via SUBCUTANEOUS
  Administered 2022-10-07: 4 [IU] via SUBCUTANEOUS
  Filled 2022-10-02: qty 0.2

## 2022-10-02 MED ORDER — ALBUTEROL SULFATE (2.5 MG/3ML) 0.083% IN NEBU: 2.5000 mg | INHALATION_SOLUTION | Freq: Four times a day (QID) | RESPIRATORY_TRACT | Status: DC | PRN

## 2022-10-02 MED ORDER — SODIUM CHLORIDE 0.9 % IV SOLN
12.5000 mg | Freq: Four times a day (QID) | INTRAVENOUS | Status: DC | PRN
  Administered 2022-10-03: 12.5 mg via INTRAVENOUS
  Filled 2022-10-02: qty 12.5

## 2022-10-02 MED ORDER — SODIUM CHLORIDE 0.9 % IV SOLN
12.5000 mg | Freq: Once | INTRAVENOUS | Status: AC
  Administered 2022-10-02: 12.5 mg via INTRAVENOUS
  Filled 2022-10-02: qty 12.5

## 2022-10-02 MED ORDER — POTASSIUM CHLORIDE CRYS ER 20 MEQ PO TBCR
20.0000 meq | EXTENDED_RELEASE_TABLET | Freq: Every day | ORAL | Status: DC
  Administered 2022-10-03 – 2022-10-07 (×5): 20 meq via ORAL
  Filled 2022-10-02 (×5): qty 1

## 2022-10-02 MED ORDER — ESCITALOPRAM OXALATE 10 MG PO TABS
10.0000 mg | ORAL_TABLET | Freq: Every morning | ORAL | Status: DC
  Administered 2022-10-03 – 2022-10-07 (×5): 10 mg via ORAL
  Filled 2022-10-02 (×5): qty 1

## 2022-10-02 MED ORDER — MIRABEGRON ER 25 MG PO TB24
25.0000 mg | ORAL_TABLET | Freq: Every day | ORAL | Status: DC
  Administered 2022-10-02 – 2022-10-07 (×6): 25 mg via ORAL
  Filled 2022-10-02 (×7): qty 1
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
## 2022-10-02 NOTE — Assessment & Plan Note (Signed)
EF of 60-65% and grade 1 diastolic dysfunction

## 2022-10-02 NOTE — ED Notes (Signed)
ED TO INPATIENT HANDOFF REPORT  Name/Age/Gender [name] [name] 67 y.o. female  Code Status Code Status History     Date Active Date Inactive Code Status Order ID Comments User Context   09/25/2021 2316 09/28/2021 1746 Full Code [id number]  Charlsie Quest, MD ED   09/04/2021 2332 09/09/2021 1645 Full Code [id number]  Oretha Milch, MD ED   02/01/2021 1904 02/03/2021 2234 Full Code [id number]  Teddy Spike, DO Inpatient   09/07/2020 2114 09/26/2020 0527 Full Code [id number]  Therisa Doyne, MD ED   08/26/2020 0100 08/29/2020 0920 Full Code [id number]  Briscoe Deutscher, MD ED   07/25/2020 1650 08/07/2020 0216 Full Code [id number]  Glade Lloyd, MD ED   02/04/2020 0308 02/12/2020 0303 Full Code [id number]  Eduard Clos, MD Inpatient   11/06/2019 0028 11/23/2019 2051 Full Code [id number]  Earney Mallet, Mohammadtokir, MD ED   10/19/2019 0620 10/20/2019 2247 Full Code [id number]  Eduard Clos, MD ED   08/20/2019 1754 08/24/2019 0604 Full Code [id number]  Versie Starks, DO ED   10/05/2018 0743 10/09/2018 1818 Full Code [id number]  Eduard Clos, MD Inpatient   11/21/2016 2330 11/24/2016 1656 Full Code [id number]  Hillary Bow, DO ED   06/28/2014 1508 07/02/2014 1725 Full Code [id number]  Coralyn Helling, MD ED       Home/SNF/Other Home  Chief Complaint Intractable nausea and vomiting [R11.2]  Level of Care/Admitting Diagnosis ED Disposition     ED Disposition  Admit   Condition  --   Comment  Hospital Area: [facility] [[id number]]  Level of Care: Telemetry [5]  Admit to tele based on following criteria: Other see comments  Comments: rate  May place patient in observation at [facility] or [facility] if equivalent level of care is available:: No  Covid Evaluation: Asymptomatic - no recent exposure (last 10 days) testing not required  Diagnosis: Intractable nausea and vomiting [720114]  Admitting Physician: Anselm Jungling [[id number]]  Attending Physician: Anselm Jungling  [[id number]]          Medical History Past Medical History:  Diagnosis Date   Diabetes mellitus without complication (HCC)    Hypertension    Knee pain, chronic    Neuropathy in diabetes (HCC)     Allergies Allergies  Allergen Reactions   Lisinopril Swelling   Morphine Itching and Rash   Penicillins Hives, Itching, Nausea And Vomiting and Rash    Has patient had a PCN reaction causing immediate rash, facial/tongue/throat swelling, SOB or lightheadedness with hypotension: No Has patient had a PCN reaction causing severe rash involving mucus membranes or skin necrosis: No Has patient had a PCN reaction that required hospitalization: No Has patient had a PCN reaction occurring within the last 10 years: No If all of the above answers are "NO", then may proceed with Cephalosporin use.     IV Location/Drains/Wounds Patient Lines/Drains/Airways Status     Active Line/Drains/Airways     Name Placement date Placement time Site Days   Peripheral IV 10/02/22 20 G Anterior External jugular 10/02/22  --  External jugular  less than 1   Wound / Incision (Open or Dehisced) 08/20/19 Incision - Dehisced Abdomen Lower;Medial;Right 08/20/19  2257  Abdomen  1139   Wound / Incision (Open or Dehisced) 10/19/19 (ITD) Intertriginous Dermatitis;Non-pressure wound Breast Left open area under breast 10/19/19  0745  Breast  1079   Wound / Incision (Open or Dehisced) 09/27/21 Non-pressure wound;(ITD) Intertriginous Dermatitis Flank Left open  area on flank 09/27/21  1548  Flank  370   Wound / Incision (Open or Dehisced) 02/04/20 Puncture Groin Left;Lower wound underneath pannus, covered with gauze and transparent dressing 02/04/20  0330  Groin  971   Wound / Incision (Open or Dehisced) 09/27/21 Non-pressure wound Thigh Anterior;Left;Proximal Pink 09/27/21  1558  Thigh  370   Wound / Incision (Open or Dehisced) 08/26/20 Non-pressure wound Flank Left;Posterior pink, mid/upper 08/26/20  1600  Flank  767    Wound / Incision (Open or Dehisced) 09/07/21 Irritant Dermatitis (Moisture Associated Skin Damage) Breast Left;Lower skin split underneath left breast 09/07/21  1200  Breast  390   Wound / Incision (Open or Dehisced) 09/27/21 Dehisced;Irritant Dermatitis (Moisture Associated Skin Damage) Arm Proximal;Right;Upper;Posterior Skin tear and yeast growth axilla 09/27/21  1552  Arm  370   Wound / Incision (Open or Dehisced) 09/27/21 Skin tear Coccyx Upper 09/27/21  1557  Coccyx  370   Wound / Incision (Open or Dehisced) 09/27/21 Skin tear Hip Anterior;Right pink 09/27/21  1600  Hip  370            Labs/Imaging Results for orders placed or performed during the hospital encounter of 10/02/22 (from the past 48 hour(s))  CBC with Differential     Status: Abnormal   Collection Time: 10/02/22  3:00 PM  Result Value Ref Range   WBC 8.9 4.0 - 10.5 K/uL   RBC 6.75 (H) 3.87 - 5.11 MIL/uL   Hemoglobin 16.2 (H) 12.0 - 15.0 g/dL   HCT 78.2 (H) 95.6 - 21.3 %   MCV 84.3 80.0 - 100.0 fL   MCH 24.0 (L) 26.0 - 34.0 pg   MCHC 28.5 (L) 30.0 - 36.0 g/dL   RDW 08.6 (H) 57.8 - 46.9 %   Platelets 441 (H) 150 - 400 K/uL   nRBC 0.0 0.0 - 0.2 %   Neutrophils Relative % 78 %   Neutro Abs 6.8 1.7 - 7.7 K/uL   Lymphocytes Relative 15 %   Lymphs Abs 1.4 0.7 - 4.0 K/uL   Monocytes Relative 6 %   Monocytes Absolute 0.5 0.1 - 1.0 K/uL   Eosinophils Relative 1 %   Eosinophils Absolute 0.1 0.0 - 0.5 K/uL   Basophils Relative 0 %   Basophils Absolute 0.0 0.0 - 0.1 K/uL   Immature Granulocytes 0 %   Abs Immature Granulocytes 0.03 0.00 - 0.07 K/uL    Comment: Performed at [facility], [address], [place], [place] [address]  Comprehensive metabolic panel     Status: Abnormal   Collection Time: 10/02/22  3:00 PM  Result Value Ref Range   Sodium 144 135 - 145 mmol/L   Potassium 4.3 3.5 - 5.1 mmol/L   Chloride 103 98 - 111 mmol/L   CO2 30 22 - 32 mmol/L   Glucose, Bld 135 (H) 70 - 99 mg/dL     Comment: Glucose reference range applies only to samples taken after fasting for at least 8 hours.   BUN 26 (H) 8 - 23 mg/dL   Creatinine, Ser 8.41 (H) 0.44 - 1.00 mg/dL   Calcium 9.0 8.9 - 32.4 mg/dL   Total Protein 8.5 (H) 6.5 - 8.1 g/dL   Albumin 3.4 (L) 3.5 - 5.0 g/dL   AST 17 15 - 41 U/L   ALT 16 0 - 44 U/L   Alkaline Phosphatase 161 (H) 38 - 126 U/L   Total Bilirubin 0.7 0.3 - 1.2 mg/dL   GFR, Estimated  35 (L) >60 mL/min    Comment: (NOTE) Calculated using the CKD-EPI Creatinine Equation (2021)    Anion gap 11 5 - 15    Comment: Performed at [facility], [address], [place], [place] [address]  Lipase, blood     Status: Abnormal   Collection Time: 10/02/22  3:00 PM  Result Value Ref Range   Lipase 56 (H) 11 - 51 U/L    Comment: Performed at [facility], [address], [place], [place] [address]  Troponin I (High Sensitivity)     Status: None   Collection Time: 10/02/22  3:00 PM  Result Value Ref Range   Troponin I (High Sensitivity) 9 <18 ng/L    Comment: (NOTE) Elevated high sensitivity troponin I (hsTnI) values and significant  changes across serial measurements may suggest ACS but many other  chronic and acute conditions are known to elevate hsTnI results.  Refer to the "Links" section for chest pain algorithms and additional  guidance. Performed at [facility], [address] [place], [place] [address]   Troponin I (High Sensitivity)     Status: None   Collection Time: 10/02/22  6:05 PM  Result Value Ref Range   Troponin I (High Sensitivity) 8 <18 ng/L    Comment: (NOTE) Elevated high sensitivity troponin I (hsTnI) values and significant  changes across serial measurements may suggest ACS but many other  chronic and acute conditions are known to elevate hsTnI results.  Refer to the "Links" section for chest pain algorithms and additional  guidance. Performed at [facility], [address] [place], [place] [address]    CT CHEST ABDOMEN PELVIS WO CONTRAST  Result Date: 10/02/2022 CLINICAL DATA:  Sepsis EXAM: CT CHEST, ABDOMEN AND PELVIS WITHOUT CONTRAST TECHNIQUE: Multidetector CT imaging of the chest, abdomen and pelvis was performed following the standard protocol without IV contrast. RADIATION DOSE REDUCTION: This exam was performed according to the departmental dose-optimization program which includes automated exposure control, adjustment of the mA and/or kV according to patient size and/or use of iterative reconstruction technique. COMPARISON:  09/04/2021, 06/13/2021 FINDINGS: CT CHEST FINDINGS Cardiovascular: Cardiomegaly. No pericardial effusion. Thoracic aorta is nonaneurysmal. Atherosclerotic calcification of the aorta and coronary arteries. Main pulmonary artery measures 3.2 cm in diameter. Mediastinum/Nodes: No axillary, mediastinal, or hilar lymphadenopathy. Thyroid gland is enlarged and heterogeneous. Trachea and esophagus demonstrate no significant abnormality. Lungs/Pleura: Mild dependent subsegmental atelectasis. Lungs are otherwise clear. No pleural effusion or pneumothorax. Musculoskeletal: No chest wall mass or suspicious bone lesions identified. Advanced degenerative changes of the right glenohumeral joint. CT ABDOMEN PELVIS FINDINGS Hepatobiliary: No focal liver abnormality is seen. No gallstones, gallbladder wall thickening, or biliary dilatation. Pancreas: Unremarkable. No pancreatic ductal dilatation or surrounding inflammatory changes. Spleen: Normal in size without focal abnormality. Adrenals/Urinary Tract: Adrenal glands are unremarkable. Kidneys are normal, without renal calculi, focal lesion, or hydronephrosis. Bladder is unremarkable. Stomach/Bowel: Stomach is within normal limits. Scattered colonic diverticulosis. No evidence of bowel wall thickening, distention, or inflammatory changes. Surgical clip in the proximal duodenum is unchanged.  Vascular/Lymphatic: Aortic atherosclerosis. No enlarged abdominal or pelvic lymph nodes. Reproductive: Status post hysterectomy. No adnexal masses. Other: No free fluid. No abdominopelvic fluid collection. No pneumoperitoneum. No abdominal wall hernia. Musculoskeletal: No acute or significant osseous findings. Chronic degenerative changes at L4-5 where there is unchanged degree of anterolisthesis. IMPRESSION: 1. No acute findings within the chest, abdomen, or pelvis. 2. Cardiomegaly. 3. Colonic diverticulosis without evidence of acute  diverticulitis. 4. Aortic and coronary artery atherosclerosis (ICD10-I70.0). 5. Enlarged and heterogeneous thyroid gland. Recommend nonemergent thyroid ultrasound (ref: J Am Coll Radiol. 2015 Feb;12(2): 143-50). Electronically Signed   By: Duanne Guess D.O.   On: 10/02/2022 18:22    Pending Labs Unresulted Labs (From admission, onward)     Start     Ordered   10/02/22 1931  Rapid urine drug screen (hospital performed)  ONCE - STAT,   STAT        10/02/22 1930   10/02/22 1500  Urinalysis, Routine w reflex microscopic -Urine, Clean Catch  Once,   URGENT       Question:  Specimen Source  Answer:  Urine, Clean Catch   10/02/22 1459            Vitals/Pain Today's Vitals   10/02/22 1452 10/02/22 1453 10/02/22 1900  BP: (!) 175/90  (!) 157/93  Pulse: 96  94  Resp: 20  12  Temp: 97.7 F (36.5 C)  98 F (36.7 C)  TempSrc: Oral  Oral  SpO2: 96%  95%  Weight:  128.4 kg   Height:  5\' 2"  (1.575 m)   PainSc:  8      Isolation Precautions No active isolations  Medications Medications  iohexol (OMNIPAQUE) 300 MG/ML solution 80 mL (has no administration in time range)  sodium chloride 0.9 % bolus 1,000 mL (0 mLs Intravenous Stopped 10/02/22 1801)  ondansetron (ZOFRAN) injection 4 mg (4 mg Intravenous Given 10/02/22 1558)  metoCLOPramide (REGLAN) injection 10 mg (10 mg Intravenous Given 10/02/22 1710)  diphenhydrAMINE (BENADRYL) injection 50 mg (50 mg  Intravenous Given 10/02/22 1710)  famotidine (PEPCID) IVPB 20 mg premix (0 mg Intravenous Stopped 10/02/22 1853)  promethazine (PHENERGAN) 12.5 mg in sodium chloride 0.9 % 50 mL IVPB (0 mg Intravenous Stopped 10/02/22 1957)  sodium chloride 0.9 % bolus 1,000 mL (1,000 mLs Intravenous New Bag/Given 10/02/22 1938)    Mobility non-ambulatory

## 2022-10-02 NOTE — ED Provider Notes (Signed)
[facility] Provider Note   CSN: [id number] Arrival date & time: 10/02/22  1434     History  Chief Complaint  Patient presents with   Abdominal Pain    [name] [name] is a 67 y.o. female history of diabetes, hypertension, obesity, here presenting with abdominal pain and diarrhea.  Patient states that several days ago she was diagnosed with left breast cellulitis versus abscess.  She states that she just finished a course of doxycycline yesterday.  She states that today she started vomiting having loose stools.  Patient denies any fevers.  Denies any history of C. difficile.  Patient also has right upper quadrant and right lower quadrant pain.  The history is provided by the patient.       Home Medications Prior to Admission medications   Medication Sig Start Date End Date Taking? Authorizing Provider  acetaminophen (TYLENOL) 500 MG tablet Take 500 mg by mouth every 6 (six) hours as needed. 07/24/20   [provider]  albuterol (VENTOLIN HFA) 108 (90 Base) MCG/ACT inhaler Inhale 2 puffs into the lungs every 6 (six) hours as needed for wheezing or shortness of breath. 08/06/20   Glade Lloyd, MD  atorvastatin (LIPITOR) 10 MG tablet Take 10 mg by mouth at bedtime.    [provider]  cetirizine (ZYRTEC) 10 MG tablet Take 10 mg by mouth daily. 11/20/20   [provider]  escitalopram (LEXAPRO) 10 MG tablet Take 1 tablet (10 mg total) by mouth every morning. 08/01/21   Reed, Tiffany L, DO  insulin glargine-yfgn (SEMGLEE) 100 UNIT/ML Pen Inject 60 Units into the skin 2 (two) times daily.  52 units in the morning and 7 units at bedtime 09/28/21   Hughie Closs, MD  Insulin Pen Needle 31G X 5 MM MISC Use as directed daily 02/03/21   Noralee Stain, DO  ketotifen (ZADITOR) 0.025 % ophthalmic solution Place 1 drop into both eyes 2 (two) times daily as needed. Patient taking differently: Place 1 drop into both eyes 2 (two)  times daily as needed (allergies, itching). 09/09/21   Almon Hercules, MD  mirtazapine (REMERON) 15 MG tablet Take 15 mg by mouth at bedtime as needed (sleep). 11/06/20   [provider]  nystatin (MYCOSTATIN/NYSTOP) powder Apply to affected area 2 (two) times daily to groin and areas of fungal infection 02/03/21   Noralee Stain, DO  nystatin cream (MYCOSTATIN) Apply 1 Application topically 2 (two) times daily. 05/10/21   [provider]  pantoprazole (PROTONIX) 40 MG tablet Take 40 mg by mouth daily before breakfast.    [provider]  potassium chloride SA (KLOR-CON M) 20 MEQ tablet Take 1 tablet (20 mEq total) by mouth daily. 09/09/21   Almon Hercules, MD  pregabalin (LYRICA) 75 MG capsule Take 75 mg by mouth 2 (two) times daily.    [provider]  Semaglutide,0.25 or 0.5MG /DOS, 2 MG/1.5ML SOPN Inject 2 mg into the skin every Thursday. 02/24/21   [provider]  torsemide (DEMADEX) 20 MG tablet Take 2 tablets (40 mg total) by mouth daily. 10/01/21   Hughie Closs, MD      Allergies    Lisinopril, Morphine, and Penicillins    Review of Systems   Review of Systems  Gastrointestinal:  Positive for abdominal pain and vomiting.  All other systems reviewed and are negative.   Physical Exam Updated Vital Signs BP (!) 157/93 (BP Location: Right Arm)   Pulse 94  Temp 98 F (36.7 C) (Oral)   Resp 12   Ht 5\' 2"  (1.575 m)   Wt 128.4 kg   SpO2 95%   BMI 51.76 kg/m  Physical Exam Vitals and nursing note reviewed.  Constitutional:      Comments: Uncomfortable and vomiting  HENT:     Head: Normocephalic.     Mouth/Throat:     Pharynx: Oropharynx is clear.  Eyes:     Extraocular Movements: Extraocular movements intact.     Pupils: Pupils are equal, round, and reactive to light.  Cardiovascular:     Rate and Rhythm: Normal rate and regular rhythm.  Pulmonary:     Effort: Pulmonary effort is normal.     Breath sounds: Normal breath sounds.   Abdominal:     Comments: Obese and patient has mild diffuse tenderness.  Skin:    Comments: Patient has left breast cellulitis.  Neurological:     General: No focal deficit present.     Mental Status: She is oriented to person, place, and time.     ED Results / Procedures / Treatments   Labs (all labs ordered are listed, but only abnormal results are displayed) Labs Reviewed  CBC WITH DIFFERENTIAL/PLATELET - Abnormal; Notable for the following components:      Result Value   RBC 6.75 (*)    Hemoglobin 16.2 (*)    HCT 56.9 (*)    MCH 24.0 (*)    MCHC 28.5 (*)    RDW 19.5 (*)    Platelets 441 (*)    All other components within normal limits  COMPREHENSIVE METABOLIC PANEL - Abnormal; Notable for the following components:   Glucose, Bld 135 (*)    BUN 26 (*)    Creatinine, Ser 1.57 (*)    Total Protein 8.5 (*)    Albumin 3.4 (*)    Alkaline Phosphatase 161 (*)    GFR, Estimated 35 (*)    All other components within normal limits  LIPASE, BLOOD - Abnormal; Notable for the following components:   Lipase 56 (*)    All other components within normal limits  URINALYSIS, ROUTINE W REFLEX MICROSCOPIC  TROPONIN I (HIGH SENSITIVITY)  TROPONIN I (HIGH SENSITIVITY)    EKG EKG Interpretation Date/Time:  Sunday October 02 2022 15:54:49 EDT Ventricular Rate:  87 PR Interval:  151 QRS Duration:  79 QT Interval:  395 QTC Calculation: 476 R Axis:   116  Text Interpretation: Sinus rhythm Probable left atrial enlargement Anteroseptal infarct, age indeterminate No significant change since last tracing Confirmed by Richardean Canal [phone]) on 10/02/2022 4:14:12 PM  Radiology CT CHEST ABDOMEN PELVIS WO CONTRAST  Result Date: 10/02/2022 CLINICAL DATA:  Sepsis EXAM: CT CHEST, ABDOMEN AND PELVIS WITHOUT CONTRAST TECHNIQUE: Multidetector CT imaging of the chest, abdomen and pelvis was performed following the standard protocol without IV contrast. RADIATION DOSE REDUCTION: This exam was  performed according to the departmental dose-optimization program which includes automated exposure control, adjustment of the mA and/or kV according to patient size and/or use of iterative reconstruction technique. COMPARISON:  09/04/2021, 06/13/2021 FINDINGS: CT CHEST FINDINGS Cardiovascular: Cardiomegaly. No pericardial effusion. Thoracic aorta is nonaneurysmal. Atherosclerotic calcification of the aorta and coronary arteries. Main pulmonary artery measures 3.2 cm in diameter. Mediastinum/Nodes: No axillary, mediastinal, or hilar lymphadenopathy. Thyroid gland is enlarged and heterogeneous. Trachea and esophagus demonstrate no significant abnormality. Lungs/Pleura: Mild dependent subsegmental atelectasis. Lungs are otherwise clear. No pleural effusion or pneumothorax. Musculoskeletal: No chest wall mass or suspicious bone  lesions identified. Advanced degenerative changes of the right glenohumeral joint. CT ABDOMEN PELVIS FINDINGS Hepatobiliary: No focal liver abnormality is seen. No gallstones, gallbladder wall thickening, or biliary dilatation. Pancreas: Unremarkable. No pancreatic ductal dilatation or surrounding inflammatory changes. Spleen: Normal in size without focal abnormality. Adrenals/Urinary Tract: Adrenal glands are unremarkable. Kidneys are normal, without renal calculi, focal lesion, or hydronephrosis. Bladder is unremarkable. Stomach/Bowel: Stomach is within normal limits. Scattered colonic diverticulosis. No evidence of bowel wall thickening, distention, or inflammatory changes. Surgical clip in the proximal duodenum is unchanged. Vascular/Lymphatic: Aortic atherosclerosis. No enlarged abdominal or pelvic lymph nodes. Reproductive: Status post hysterectomy. No adnexal masses. Other: No free fluid. No abdominopelvic fluid collection. No pneumoperitoneum. No abdominal wall hernia. Musculoskeletal: No acute or significant osseous findings. Chronic degenerative changes at L4-5 where there is unchanged  degree of anterolisthesis. IMPRESSION: 1. No acute findings within the chest, abdomen, or pelvis. 2. Cardiomegaly. 3. Colonic diverticulosis without evidence of acute diverticulitis. 4. Aortic and coronary artery atherosclerosis (ICD10-I70.0). 5. Enlarged and heterogeneous thyroid gland. Recommend nonemergent thyroid ultrasound (ref: J Am Coll Radiol. 2015 Feb;12(2): 143-50). Electronically Signed   By: Duanne Guess D.O.   On: 10/02/2022 18:22    Procedures Procedures    Angiocath insertion Performed by: Richardean Canal  Consent: Verbal consent obtained. Risks and benefits: risks, benefits and alternatives were discussed Time out: Immediately prior to procedure a "time out" was called to verify the correct patient, procedure, equipment, support staff and site/side marked as required.  Preparation: Patient was prepped and draped in the usual sterile fashion.  Vein Location: R EJ  Ultrasound Guided  Gauge: 20 long   Normal blood return and flush without difficulty Patient tolerance: Patient tolerated the procedure well with no immediate complications.    Medications Ordered in ED Medications  iohexol (OMNIPAQUE) 300 MG/ML solution 80 mL (has no administration in time range)  promethazine (PHENERGAN) 12.5 mg in sodium chloride 0.9 % 50 mL IVPB (has no administration in time range)  sodium chloride 0.9 % bolus 1,000 mL (has no administration in time range)  sodium chloride 0.9 % bolus 1,000 mL (0 mLs Intravenous Stopped 10/02/22 1801)  ondansetron (ZOFRAN) injection 4 mg (4 mg Intravenous Given 10/02/22 1558)  metoCLOPramide (REGLAN) injection 10 mg (10 mg Intravenous Given 10/02/22 1710)  diphenhydrAMINE (BENADRYL) injection 50 mg (50 mg Intravenous Given 10/02/22 1710)  famotidine (PEPCID) IVPB 20 mg premix (0 mg Intravenous Stopped 10/02/22 1853)    ED Course/ Medical Decision Making/ A&P                                 Medical Decision Making VERNETT KUCHAREK is a 67 y.o.  female here presenting with abdominal pain and vomiting after taking doxycycline.  Consider medication side effect versus gastritis versus small bowel obstruction versus ileus versus persistent breast abscess.  Patient is a very difficult IV stick.  I was unable to get any IVs in her arms.  I was able to get EJ on the right side.  Will get CT chest abdomen pelvis.  Will give IV fluids and antiemetics.  7:18 PM Reviewed patient's labs and creatinine is 1.5.  Patient's lipase is 56.  CT chest and pelvis unremarkable.  Patient was given multiple rounds of nausea medicine.  She is still nauseated and vomiting.  At this point patient will be admitted for intractable vomiting.  Problems Addressed: Dehydration: acute illness or injury Nausea  and vomiting, unspecified vomiting type: acute illness or injury  Amount and/or Complexity of Data Reviewed Labs: ordered. Decision-making details documented in ED Course. Radiology: ordered and independent interpretation performed. Decision-making details documented in ED Course. ECG/medicine tests: ordered and independent interpretation performed. Decision-making details documented in ED Course.  Risk Prescription drug management. Decision regarding hospitalization.    Final Clinical Impression(s) / ED Diagnoses Final diagnoses:  None    Rx / DC Orders ED Discharge Orders     None         Charlynne Pander, MD 10/02/22 1919

## 2022-10-02 NOTE — Assessment & Plan Note (Signed)
continue statin

## 2022-10-02 NOTE — Assessment & Plan Note (Signed)
-  pt declines CPAP for tonight

## 2022-10-02 NOTE — ED Triage Notes (Signed)
Patient from home with c/o RUQ and RLQ abd pain and vomiting.  Patient states she had similar pain on Tuesday but pain subsided.  Patient is back today.  Patient has been on doxycycline for an abscess on her l breast, she states antibiotics cause her to have abdominal pain

## 2022-10-02 NOTE — Assessment & Plan Note (Signed)
-  creatinine of 1.57 with previous around 2-3

## 2022-10-02 NOTE — Assessment & Plan Note (Signed)
BMI of 51

## 2022-10-02 NOTE — Assessment & Plan Note (Addendum)
-  A1C 10.3 in March -on 50 units Semglee at home -place on SSI for now since she has not be able to tolerate much oral intake

## 2022-10-02 NOTE — Assessment & Plan Note (Addendum)
-  pt with intermittent and now worsening nausea, vomiting and diarrhea after completing a course of doxycyline which potentially is source of her symptoms. Although she does report use of marijuana. UDS is pending.  -CT A/P negative, CMP mostly unremarkable  -will test also for COVID -test for C.diff and stool study with her ongoing diarrhea -continue symptomatic management with PRN antiemetics -full liquid diet for bowel rest and can advance as tolerated

## 2022-10-03 DIAGNOSIS — N1832 Chronic kidney disease, stage 3b: Secondary | ICD-10-CM | POA: Diagnosis present

## 2022-10-03 DIAGNOSIS — Z1152 Encounter for screening for COVID-19: Secondary | ICD-10-CM | POA: Diagnosis not present

## 2022-10-03 DIAGNOSIS — G4733 Obstructive sleep apnea (adult) (pediatric): Secondary | ICD-10-CM | POA: Diagnosis present

## 2022-10-03 DIAGNOSIS — Z880 Allergy status to penicillin: Secondary | ICD-10-CM | POA: Diagnosis not present

## 2022-10-03 DIAGNOSIS — E1169 Type 2 diabetes mellitus with other specified complication: Secondary | ICD-10-CM | POA: Diagnosis present

## 2022-10-03 DIAGNOSIS — Z885 Allergy status to narcotic agent status: Secondary | ICD-10-CM | POA: Diagnosis not present

## 2022-10-03 DIAGNOSIS — E785 Hyperlipidemia, unspecified: Secondary | ICD-10-CM | POA: Diagnosis present

## 2022-10-03 DIAGNOSIS — I130 Hypertensive heart and chronic kidney disease with heart failure and stage 1 through stage 4 chronic kidney disease, or unspecified chronic kidney disease: Secondary | ICD-10-CM | POA: Diagnosis present

## 2022-10-03 DIAGNOSIS — I5032 Chronic diastolic (congestive) heart failure: Secondary | ICD-10-CM | POA: Diagnosis present

## 2022-10-03 DIAGNOSIS — Z6841 Body Mass Index (BMI) 40.0 and over, adult: Secondary | ICD-10-CM | POA: Diagnosis not present

## 2022-10-03 DIAGNOSIS — N179 Acute kidney failure, unspecified: Secondary | ICD-10-CM | POA: Diagnosis present

## 2022-10-03 DIAGNOSIS — R112 Nausea with vomiting, unspecified: Secondary | ICD-10-CM | POA: Diagnosis present

## 2022-10-03 DIAGNOSIS — Z87891 Personal history of nicotine dependence: Secondary | ICD-10-CM | POA: Diagnosis not present

## 2022-10-03 DIAGNOSIS — F419 Anxiety disorder, unspecified: Secondary | ICD-10-CM | POA: Diagnosis present

## 2022-10-03 DIAGNOSIS — Z833 Family history of diabetes mellitus: Secondary | ICD-10-CM | POA: Diagnosis not present

## 2022-10-03 DIAGNOSIS — Z79899 Other long term (current) drug therapy: Secondary | ICD-10-CM | POA: Diagnosis not present

## 2022-10-03 DIAGNOSIS — E1165 Type 2 diabetes mellitus with hyperglycemia: Secondary | ICD-10-CM | POA: Diagnosis present

## 2022-10-03 DIAGNOSIS — Z9981 Dependence on supplemental oxygen: Secondary | ICD-10-CM | POA: Diagnosis not present

## 2022-10-03 DIAGNOSIS — Z794 Long term (current) use of insulin: Secondary | ICD-10-CM | POA: Diagnosis not present

## 2022-10-03 DIAGNOSIS — E1140 Type 2 diabetes mellitus with diabetic neuropathy, unspecified: Secondary | ICD-10-CM | POA: Diagnosis present

## 2022-10-03 DIAGNOSIS — J9611 Chronic respiratory failure with hypoxia: Secondary | ICD-10-CM | POA: Diagnosis present

## 2022-10-03 DIAGNOSIS — Z888 Allergy status to other drugs, medicaments and biological substances status: Secondary | ICD-10-CM | POA: Diagnosis not present

## 2022-10-03 DIAGNOSIS — E1122 Type 2 diabetes mellitus with diabetic chronic kidney disease: Secondary | ICD-10-CM | POA: Diagnosis present

## 2022-10-03 LAB — GLUCOSE, CAPILLARY
Glucose-Capillary: 138 mg/dL — ABNORMAL HIGH (ref 70–99)
Glucose-Capillary: 114 mg/dL — ABNORMAL HIGH (ref 70–99)
Glucose-Capillary: 123 mg/dL — ABNORMAL HIGH (ref 70–99)
Glucose-Capillary: 105 mg/dL — ABNORMAL HIGH (ref 70–99)

## 2022-10-03 LAB — BLOOD GAS, ARTERIAL
Acid-Base Excess: 5.9 mmol/L — ABNORMAL HIGH (ref 0.0–2.0)
Bicarbonate: 34.5 mmol/L — ABNORMAL HIGH (ref 20.0–28.0)
Drawn by: 270211
O2 Content: 6 L/min
O2 Saturation: 95.7 %
Patient temperature: 37
pCO2 arterial: 67 mmHg (ref 32–48)
pH, Arterial: 7.32 — ABNORMAL LOW (ref 7.35–7.45)
pO2, Arterial: 75 mmHg — ABNORMAL LOW (ref 83–108)

## 2022-10-03 LAB — URINALYSIS, ROUTINE W REFLEX MICROSCOPIC
Bacteria, UA: NONE SEEN
Bilirubin Urine: NEGATIVE
Glucose, UA: NEGATIVE mg/dL
Hgb urine dipstick: NEGATIVE
Ketones, ur: NEGATIVE mg/dL
Leukocytes,Ua: NEGATIVE
Nitrite: NEGATIVE
Protein, ur: >=300 mg/dL — AB
Specific Gravity, Urine: 1.016 (ref 1.005–1.030)
pH: 6 (ref 5.0–8.0)

## 2022-10-03 LAB — HEMOGLOBIN A1C
Hgb A1c MFr Bld: 9.3 % — ABNORMAL HIGH (ref 4.8–5.6)
Mean Plasma Glucose: 220 mg/dL

## 2022-10-03 LAB — TSH: TSH: 3.471 u[IU]/mL (ref 0.350–4.500)

## 2022-10-03 LAB — HIV ANTIBODY (ROUTINE TESTING W REFLEX): HIV Screen 4th Generation wRfx: NONREACTIVE

## 2022-10-03 NOTE — Plan of Care (Signed)

## 2022-10-03 NOTE — Progress Notes (Signed)
   10/03/22 2313  BiPAP/CPAP/SIPAP  BiPAP/CPAP/SIPAP Pt Type Adult  BiPAP/CPAP/SIPAP DREAMSTATIOND  Mask Type Nasal mask  Mask Size Medium  IPAP 12 cmH20  EPAP 5 cmH2O  Flow Rate 5 lpm  Patient Home Equipment No  Auto Titrate No  Nasal massage performed No (comment)

## 2022-10-03 NOTE — Progress Notes (Signed)
PROGRESS NOTE  [name] [name] ZOX:[id number] DOB: 23-Dec-1955 DOA: 10/02/2022 [facility]CP: [name]   LOS: 0 days   Brief Narrative / Interim history: 67 year old female with history of chronic diastolic CHF, OSA on CPAP, chronic hypoxia on 4 L at home, IDDM, CKD 3B, morbid obesity, anxiety/depression comes to the hospital with nausea and vomiting.  She recently completed a course of doxycycline for left breast abscess, which has now improved.  As she was taking her antibiotics has been having intermittent nausea, vomit as well as diarrhea.  Due to poor p.o. intake, persistent symptoms was admitted to the hospital  Subjective / 24h Interval events: Continues to complain of persistent nausea this morning.  No diarrhea overnight  Assesement and Plan: Principal Problem:   Intractable nausea and vomiting Active Problems:   (HFpEF) heart failure with preserved ejection fraction (HCC)   Type 2 diabetes mellitus with stage 3 chronic kidney disease (HCC)   Hyperlipidemia associated with type 2 diabetes mellitus (HCC)   OSA on CPAP   Morbid obesity with BMI of 50.0-59.9, adult (HCC)   Chronic kidney disease, stage 3b (HCC)   Principal problem Intractable nausea, vomiting, diarrhea -underwent a CT scan of the chest abdomen pelvis on admission which was negative for acute findings.  She has colonic diverticulosis but no evidence of acute diverticulitis.  This is likely side effect from recent course of doxycycline -C. difficile, GI pathogen panel pending -Continue supportive care, hold off on advancing her diet since she is still symptomatic  Active problems Enlarged thyroid gland -seen on the CT scan, will need outpatient thyroid ultrasound.  Due to symptoms above, obtain a TSH  Chronic diastolic CHF -appears euvolemic, most recent 2D echo shows an EF of 60-65% grade 1 diastolic dysfunction  CKD 3B -creatinine slightly better than her baseline  OSA on CPAP-she declined CPAP last  night, discussed with her the importance of ongoing compliance.  Reorder CPAP for tonight  Hyperlipidemia-continue statin  Obesity, morbid-BMI 51, she would benefit from weight loss  DM 2, poorly controlled, with hyperglycemia -continue sliding.  Most recent A1c 10.3 in March  CBG (last 3)  Recent Labs    10/02/22 2240 10/03/22 0854  GLUCAP 138* 138*    Scheduled Meds:  atorvastatin  10 mg Oral QHS   cholecalciferol  1,000 Units Oral Daily   enoxaparin (LOVENOX) injection  60 mg Subcutaneous QHS   escitalopram  10 mg Oral q morning   hydrOXYzine  25 mg Oral BID   insulin aspart  0-20 Units Subcutaneous TID PC & HS   loratadine  10 mg Oral Daily   mirabegron ER  25 mg Oral Daily   pantoprazole  40 mg Oral QAC breakfast   potassium chloride SA  20 mEq Oral Daily   pregabalin  75 mg Oral BID   Continuous Infusions:  promethazine (PHENERGAN) injection (IM or IVPB)     PRN Meds:.acetaminophen, albuterol, iohexol, mirtazapine, ondansetron (ZOFRAN) IV, promethazine (PHENERGAN) injection (IM or IVPB)  Current Outpatient Medications  Medication Instructions   acetaminophen (TYLENOL) 500 mg, Oral, Every 6 hours PRN   albuterol (VENTOLIN HFA) 108 (90 Base) MCG/ACT inhaler 2 puffs, Inhalation, Every 6 hours PRN   atorvastatin (LIPITOR) 10 mg, Oral, Daily at bedtime   cetirizine (ZYRTEC) 10 mg, Oral, Daily   cholecalciferol (VITAMIN D3) 1,000 Units, Oral, Daily   escitalopram (LEXAPRO) 10 mg, Oral, Every morning   hydrOXYzine (VISTARIL) 25 mg, Oral, 2 times daily   insulin glargine-yfgn (SEMGLEE) 60  Units, Subcutaneous, 2 times daily,  52 units in the morning and 7 units at bedtime   Insulin Pen Needle 31G X 5 MM MISC Use as directed daily   mirabegron ER (MYRBETRIQ) 25 mg, Oral, Daily   mirtazapine (REMERON) 15 mg, Oral, At bedtime PRN   nystatin (MYCOSTATIN/NYSTOP) powder Apply to affected area 2 (two) times daily to groin and areas of fungal infection   pantoprazole (PROTONIX)  40 mg, Oral, Daily before breakfast   potassium chloride SA (KLOR-CON M) 20 MEQ tablet 20 mEq, Oral, Daily   pregabalin (LYRICA) 75 mg, Oral, 2 times daily   Semaglutide(0.25 or 0.5MG /DOS) 2 mg, Subcutaneous, Every Thu    Diet Orders (From admission, onward)     Start     Ordered   10/02/22 2120  Diet full liquid Room service appropriate? Yes; Fluid consistency: Thin  Diet effective now       Question Answer Comment  Room service appropriate? Yes   Fluid consistency: Thin      10/02/22 2119           DVT prophylaxis:   Lab Results  Component Value Date   PLT 405 (H) 10/03/2022      Code Status: Full Code  Family Communication: no family at bedside  Status is: Observation The patient will require care spanning > 2 midnights and should be moved to inpatient because: persistent symptoms   Level of care: Telemetry  Consultants:  none  Objective: Vitals:   10/02/22 1900 10/02/22 2224 10/03/22 0224 10/03/22 0859  BP: (!) 157/93 (!) 164/92 (!) 158/78 127/73  Pulse: 94 97 99 93  Resp: 12 18 18 20   Temp: 98 F (36.7 C) 97.9 F (36.6 C) 98.1 F (36.7 C) 98.1 F (36.7 C)  TempSrc: Oral Oral Oral Oral  SpO2: 95% 98% 94%   Weight:      Height:        Intake/Output Summary (Last 24 hours) at 10/03/2022 1044 Last data filed at 10/03/2022 0537 Gross per 24 hour  Intake 1100 ml  Output 200 ml  Net 900 ml   Wt Readings from Last 3 Encounters:  10/02/22 128.4 kg  12/01/21 136.1 kg  09/28/21 133.2 kg    Examination:  Constitutional: NAD Eyes: no scleral icterus ENMT: Mucous membranes are moist.  Neck: normal, supple Respiratory: clear to auscultation bilaterally, no wheezing, no crackles. Normal respiratory effort. No accessory muscle use.  Cardiovascular: Regular rate and rhythm, no murmurs / rubs / gallops. No LE edema.  Abdomen: non distended, no tenderness. Bowel sounds positive.  Musculoskeletal: no clubbing / cyanosis.   Data Reviewed: I have  independently reviewed following labs and imaging studies   CBC Recent Labs  Lab 10/02/22 1500 10/03/22 0536  WBC 8.9 7.1  HGB 16.2* 14.4  HCT 56.9* 50.4*  PLT 441* 405*  MCV 84.3 84.6  MCH 24.0* 24.2*  MCHC 28.5* 28.6*  RDW 19.5* 18.6*  LYMPHSABS 1.4  --   MONOABS 0.5  --   EOSABS 0.1  --   BASOSABS 0.0  --     Recent Labs  Lab 10/02/22 1500 10/03/22 0536  NA 144 144  K 4.3 3.8  CL 103 106  CO2 30 28  GLUCOSE 135* 129*  BUN 26* 25*  CREATININE 1.57* 1.52*  CALCIUM 9.0 8.3*  AST 17  --   ALT 16  --   ALKPHOS 161*  --   BILITOT 0.7  --   ALBUMIN 3.4*  --     ------------------------------------------------------------------------------------------------------------------  No results for input(s): "CHOL", "HDL", "LDLCALC", "TRIG", "CHOLHDL", "LDLDIRECT" in the last 72 hours.  Lab Results  Component Value Date   HGBA1C 15.1 (H) 09/05/2021   ------------------------------------------------------------------------------------------------------------------ No results for input(s): "TSH", "T4TOTAL", "T3FREE", "THYROIDAB" in the last 72 hours.  Invalid input(s): "FREET3"  Cardiac Enzymes No results for input(s): "CKMB", "TROPONINI", "MYOGLOBIN" in the last 168 hours.  Invalid input(s): "CK" ------------------------------------------------------------------------------------------------------------------    Component Value Date/Time   BNP 93.8 09/08/2021 0419    CBG: Recent Labs  Lab 10/02/22 2240 10/03/22 0854  GLUCAP 138* 138*    Recent Results (from the past 240 hour(s))  SARS Coronavirus 2 by RT PCR (hospital order, performed in [facility] hospital lab) *cepheid single result test* Anterior Nasal Swab     Status: None   Collection Time: 10/02/22 10:52 PM   Specimen: Anterior Nasal Swab  Result Value Ref Range Status   SARS Coronavirus 2 by RT PCR NEGATIVE NEGATIVE Final    Comment: (NOTE) SARS-CoV-2 target nucleic acids are NOT DETECTED.  The  SARS-CoV-2 RNA is generally detectable in upper and lower respiratory specimens during the acute phase of infection. The lowest concentration of SARS-CoV-2 viral copies this assay can detect is 250 copies / mL. A negative result does not preclude SARS-CoV-2 infection and should not be used as the sole basis for treatment or other patient management decisions.  A negative result may occur with improper specimen collection / handling, submission of specimen other than nasopharyngeal swab, presence of viral mutation(s) within the areas targeted by this assay, and inadequate number of viral copies (<250 copies / mL). A negative result must be combined with clinical observations, patient history, and epidemiological information.  Fact Sheet for Patients:   RoadLapTop.co.za  Fact Sheet for Healthcare Providers: http://kim-miller.com/  This test is not yet approved or  cleared by the [place] FDA and has been authorized for detection and/or diagnosis of SARS-CoV-2 by FDA under an Emergency Use Authorization (EUA).  This EUA will remain in effect (meaning this test can be used) for the duration of the COVID-19 declaration under Section 564(b)(1) of the Act, 21 U.S.C. section 360bbb-3(b)(1), unless the authorization is terminated or revoked sooner.  Performed at [facility], [address] [place], [place] [address]      Radiology Studies: CT CHEST ABDOMEN PELVIS WO CONTRAST  Result Date: 10/02/2022 CLINICAL DATA:  Sepsis EXAM: CT CHEST, ABDOMEN AND PELVIS WITHOUT CONTRAST TECHNIQUE: Multidetector CT imaging of the chest, abdomen and pelvis was performed following the standard protocol without IV contrast. RADIATION DOSE REDUCTION: This exam was performed according to the departmental dose-optimization program which includes automated exposure control, adjustment of the mA and/or kV according to patient size and/or use of  iterative reconstruction technique. COMPARISON:  09/04/2021, 06/13/2021 FINDINGS: CT CHEST FINDINGS Cardiovascular: Cardiomegaly. No pericardial effusion. Thoracic aorta is nonaneurysmal. Atherosclerotic calcification of the aorta and coronary arteries. Main pulmonary artery measures 3.2 cm in diameter. Mediastinum/Nodes: No axillary, mediastinal, or hilar lymphadenopathy. Thyroid gland is enlarged and heterogeneous. Trachea and esophagus demonstrate no significant abnormality. Lungs/Pleura: Mild dependent subsegmental atelectasis. Lungs are otherwise clear. No pleural effusion or pneumothorax. Musculoskeletal: No chest wall mass or suspicious bone lesions identified. Advanced degenerative changes of the right glenohumeral joint. CT ABDOMEN PELVIS FINDINGS Hepatobiliary: No focal liver abnormality is seen. No gallstones, gallbladder wall thickening, or biliary dilatation. Pancreas: Unremarkable. No pancreatic ductal dilatation or surrounding inflammatory changes. Spleen: Normal in size without focal abnormality. Adrenals/Urinary Tract: Adrenal glands are unremarkable.  Kidneys are normal, without renal calculi, focal lesion, or hydronephrosis. Bladder is unremarkable. Stomach/Bowel: Stomach is within normal limits. Scattered colonic diverticulosis. No evidence of bowel wall thickening, distention, or inflammatory changes. Surgical clip in the proximal duodenum is unchanged. Vascular/Lymphatic: Aortic atherosclerosis. No enlarged abdominal or pelvic lymph nodes. Reproductive: Status post hysterectomy. No adnexal masses. Other: No free fluid. No abdominopelvic fluid collection. No pneumoperitoneum. No abdominal wall hernia. Musculoskeletal: No acute or significant osseous findings. Chronic degenerative changes at L4-5 where there is unchanged degree of anterolisthesis. IMPRESSION: 1. No acute findings within the chest, abdomen, or pelvis. 2. Cardiomegaly. 3. Colonic diverticulosis without evidence of acute  diverticulitis. 4. Aortic and coronary artery atherosclerosis (ICD10-I70.0). 5. Enlarged and heterogeneous thyroid gland. Recommend nonemergent thyroid ultrasound (ref: J Am Coll Radiol. 2015 Feb;12(2): 143-50). Electronically Signed   By: Duanne Guess D.O.   On: 10/02/2022 18:22     Pamella Pert, MD, PhD [facility]  Between 7 am - 7 pm I am available, please contact me via Amion (for emergencies) or Securechat (non urgent messages)  Between 7 pm - 7 am I am not available, please contact night coverage MD/APP via Amion

## 2022-10-03 NOTE — TOC CM/SW Note (Signed)
Transition of Care [facility]) - Inpatient Brief Assessment   Patient Details  Name: [name] [name] MRN: [id number] Date of Birth: May 12, 1955  [facility]) CM/SW Contact:    Otelia Santee, LCSW Phone Number: 10/03/2022, 11:12 AM   Clinical Narrative: Pt on 4L O2 PRN at baseline.    Transition of Care Asessment: Insurance and Status: Insurance coverage has been reviewed Patient has primary care physician: Yes Home environment has been reviewed: Home Prior level of function:: Independent Prior/Current Home Services: No current home services Social Determinants of Health Reivew: SDOH reviewed no interventions necessary Readmission risk has been reviewed: Yes Transition of care needs: no transition of care needs at this time

## 2022-10-04 DIAGNOSIS — R112 Nausea with vomiting, unspecified: Secondary | ICD-10-CM | POA: Diagnosis not present

## 2022-10-04 LAB — GLUCOSE, CAPILLARY
Glucose-Capillary: 116 mg/dL — ABNORMAL HIGH (ref 70–99)
Glucose-Capillary: 101 mg/dL — ABNORMAL HIGH (ref 70–99)
Glucose-Capillary: 124 mg/dL — ABNORMAL HIGH (ref 70–99)
Glucose-Capillary: 158 mg/dL — ABNORMAL HIGH (ref 70–99)

## 2022-10-04 MED ORDER — DIPHENHYDRAMINE HCL 50 MG/ML IJ SOLN
12.5000 mg | Freq: Once | INTRAMUSCULAR | Status: AC
  Administered 2022-10-04: 12.5 mg via INTRAVENOUS
  Filled 2022-10-04: qty 1

## 2022-10-04 MED ORDER — METOPROLOL TARTRATE 5 MG/5ML IV SOLN: 5.0000 mg | INTRAVENOUS | Status: DC | PRN

## 2022-10-04 MED ORDER — DIPHENHYDRAMINE HCL 25 MG PO CAPS
25.0000 mg | ORAL_CAPSULE | Freq: Three times a day (TID) | ORAL | Status: DC | PRN
  Administered 2022-10-05 – 2022-10-06 (×3): 25 mg via ORAL
  Filled 2022-10-04 (×3): qty 1

## 2022-10-04 MED ORDER — MAGNESIUM SULFATE 2 GM/50ML IV SOLN
2.0000 g | Freq: Once | INTRAVENOUS | Status: AC
  Administered 2022-10-04: 2 g via INTRAVENOUS
  Filled 2022-10-04: qty 50

## 2022-10-04 MED ORDER — CAMPHOR-MENTHOL 0.5-0.5 % EX LOTN
TOPICAL_LOTION | CUTANEOUS | Status: DC | PRN
  Administered 2022-10-05: 1 via TOPICAL
  Filled 2022-10-04: qty 222

## 2022-10-04 MED ORDER — GUAIFENESIN 100 MG/5ML PO LIQD: 5.0000 mL | ORAL | Status: DC | PRN

## 2022-10-04 MED ORDER — SODIUM CHLORIDE 0.9 % IV SOLN: INTRAVENOUS | Status: DC

## 2022-10-04 MED ORDER — IPRATROPIUM-ALBUTEROL 0.5-2.5 (3) MG/3ML IN SOLN: 3.0000 mL | RESPIRATORY_TRACT | Status: DC | PRN

## 2022-10-04 MED ORDER — HYDRALAZINE HCL 20 MG/ML IJ SOLN: 10.0000 mg | INTRAMUSCULAR | Status: DC | PRN

## 2022-10-04 MED ORDER — GABAPENTIN 300 MG PO CAPS: 600.0000 mg | ORAL_CAPSULE | Freq: Three times a day (TID) | ORAL | Status: DC | PRN

## 2022-10-04 MED ORDER — GABAPENTIN 300 MG PO CAPS
300.0000 mg | ORAL_CAPSULE | Freq: Three times a day (TID) | ORAL | Status: DC | PRN
  Administered 2022-10-05 (×2): 300 mg via ORAL
  Filled 2022-10-04 (×2): qty 1

## 2022-10-04 NOTE — Progress Notes (Signed)
PROGRESS NOTE    [name] [name]  WUJ:[id number] DOB: [birth year] DOA: 10/02/2022 P[facility]: [name]   Brief Narrative:  67 year old with history of diastolic CHF, OSA on CPAP, chronic hypoxia on 4 L nasal cannula, insulin-dependent DM2, CKD stage IIIb, anxiety/depression comes to the hospital with nausea and vomiting.  CT abdomen pelvis did not show any acute pathology.  Initially reported of diarrhea but none since being in the hospital.  Recently completed a course for doxycycline.   Assessment & Plan:  Principal Problem:   Intractable nausea and vomiting Active Problems:   (HFpEF) heart failure with preserved ejection fraction (HCC)   Type 2 diabetes mellitus with stage 3 chronic kidney disease (HCC)   Hyperlipidemia associated with type 2 diabetes mellitus (HCC)   OSA on CPAP   Morbid obesity with BMI of 50.0-59.9, adult (HCC)   Chronic kidney disease, stage 3b (HCC)     Assessment and Plan: * Intractable nausea and vomiting Marijuana use CT abdomen pelvis is negative for acute pathology.  No diarrhea being assessed in the hospital.  COVID test is negative.  Unable to collect GI panel/C. difficile sample.  Diet as tolerated.  She does use marijuana therefore this could be the culprit of cyclical vomiting syndrome. Antiemetics as needed, hydration.  (HFpEF) heart failure with preserved ejection fraction (HCC) EF of 60-65% and grade 1 diastolic dysfunction   Chronic kidney disease, stage 3b (HCC) Creatinine around baseline of 1.5  Morbid obesity with BMI of 50.0-59.9, adult (HCC) BMI of 51  OSA on CPAP -pt declines CPAP for tonight  Hyperlipidemia associated with type 2 diabetes mellitus (HCC) -continue statin  Type 2 diabetes mellitus with stage 3 chronic kidney disease (HCC) -A1C 10.3 in March -on 50 units Semglee at home -place on SSI for now since she has not be able to tolerate much oral intake  Peripheral neuropathy - Continue Lyrica.  She  reporting of significant neuropathy pain, gabapentin as needed added for now.  Anxiety - On Myrbetriq, Lexapro, Atarax   DVT prophylaxis: Lovenox   Code Status: Full code Family Communication:   Status is: Inpatient Still having persistent nausea vomiting, poor oral tolerance.       Diet Orders (From admission, onward)     Start     Ordered   10/02/22 2120  Diet full liquid Room service appropriate? Yes; Fluid consistency: Thin  Diet effective now       Question Answer Comment  Room service appropriate? Yes   Fluid consistency: Thin      10/02/22 2119            Subjective: Nausea vomiting and diarrhea has improved.  Would like to try something more to eat and drink Reporting of significant neuropathic pain bilaterally especially in the lower extremity.  Requesting something additional on top of her Lyrica.   Examination:  General exam: Appears calm and comfortable, morbid obesity Respiratory system: Clear to auscultation. Respiratory effort normal. Cardiovascular system: S1 & S2 heard, RRR. No JVD, murmurs, rubs, gallops or clicks. No pedal edema. Gastrointestinal system: Abdomen is nondistended, soft and nontender. No organomegaly or masses felt. Normal bowel sounds heard. Central nervous system: Alert and oriented. No focal neurological deficits. Extremities: Symmetric 5 x 5 power. Skin: No rashes, lesions or ulcers Psychiatry: Judgement and insight appear normal. Mood & affect appropriate.  Objective: Vitals:   10/03/22 0859 10/03/22 1429 10/03/22 1952 10/04/22 0522  BP: 127/73 (!) 145/66 (!) 155/76 125/66  Pulse: 93 96 95  78  Resp: 20 15 16 16   Temp: 98.1 F (36.7 C) 98.2 F (36.8 C) 98.9 F (37.2 C) 98.8 F (37.1 C)  TempSrc: Oral Oral    SpO2:  (!) 86% 92% 91%  Weight:      Height:        Intake/Output Summary (Last 24 hours) at 10/04/2022 0934 Last data filed at 10/03/2022 2200 Gross per 24 hour  Intake 230 ml  Output 300 ml  Net -70 ml    Filed Weights   10/02/22 1453  Weight: 128.4 kg    Scheduled Meds:  atorvastatin  10 mg Oral QHS   cholecalciferol  1,000 Units Oral Daily   enoxaparin (LOVENOX) injection  60 mg Subcutaneous QHS   escitalopram  10 mg Oral q morning   hydrOXYzine  25 mg Oral BID   insulin aspart  0-20 Units Subcutaneous TID PC & HS   loratadine  10 mg Oral Daily   mirabegron ER  25 mg Oral Daily   pantoprazole  40 mg Oral QAC breakfast   potassium chloride SA  20 mEq Oral Daily   pregabalin  75 mg Oral BID   Continuous Infusions:  promethazine (PHENERGAN) injection (IM or IVPB) 12.5 mg (10/03/22 1735)    Nutritional status     Body mass index is 51.76 kg/m.  Data Reviewed:   CBC: Recent Labs  Lab 10/02/22 1500 10/03/22 0536 10/04/22 0535  WBC 8.9 7.1 5.8  NEUTROABS 6.8  --   --   HGB 16.2* 14.4 14.3  HCT 56.9* 50.4* 51.0*  MCV 84.3 84.6 85.6  PLT 441* 405* 374   Basic Metabolic Panel: Recent Labs  Lab 10/02/22 1500 10/03/22 0536 10/04/22 0535  NA 144 144 144  K 4.3 3.8 4.2  CL 103 106 106  CO2 30 28 28   GLUCOSE 135* 129* 106*  BUN 26* 25* 23  CREATININE 1.57* 1.52* 1.58*  CALCIUM 9.0 8.3* 9.0  MG  --   --  1.5*   GFR: Estimated Creatinine Clearance: 43.2 mL/min (A) (by C-G formula based on SCr of 1.58 mg/dL (H)). Liver Function Tests: Recent Labs  Lab 10/02/22 1500 10/04/22 0535  AST 17 12*  ALT 16 14  ALKPHOS 161* 125  BILITOT 0.7 0.6  PROT 8.5* 6.8  ALBUMIN 3.4* 2.9*   Recent Labs  Lab 10/02/22 1500  LIPASE 56*   No results for input(s): "AMMONIA" in the last 168 hours. Coagulation Profile: No results for input(s): "INR", "PROTIME" in the last 168 hours. Cardiac Enzymes: No results for input(s): "CKTOTAL", "CKMB", "CKMBINDEX", "TROPONINI" in the last 168 hours. BNP (last 3 results) No results for input(s): "PROBNP" in the last 8760 hours. HbA1C: Recent Labs    10/03/22 0536  HGBA1C 9.3*   CBG: Recent Labs  Lab 10/03/22 0854  10/03/22 1158 10/03/22 1642 10/03/22 2139 10/04/22 0804  GLUCAP 138* 114* 123* 105* 116*   Lipid Profile: No results for input(s): "CHOL", "HDL", "LDLCALC", "TRIG", "CHOLHDL", "LDLDIRECT" in the last 72 hours. Thyroid Function Tests: Recent Labs    10/03/22 1245  TSH 3.471   Anemia Panel: No results for input(s): "VITAMINB12", "FOLATE", "FERRITIN", "TIBC", "IRON", "RETICCTPCT" in the last 72 hours. Sepsis Labs: No results for input(s): "PROCALCITON", "LATICACIDVEN" in the last 168 hours.  Recent Results (from the past 240 hour(s))  SARS Coronavirus 2 by RT PCR (hospital order, performed in [facility] hospital lab) *cepheid single result test* Anterior Nasal Swab     Status: None  Collection Time: 10/02/22 10:52 PM   Specimen: Anterior Nasal Swab  Result Value Ref Range Status   SARS Coronavirus 2 by RT PCR NEGATIVE NEGATIVE Final    Comment: (NOTE) SARS-CoV-2 target nucleic acids are NOT DETECTED.  The SARS-CoV-2 RNA is generally detectable in upper and lower respiratory specimens during the acute phase of infection. The lowest concentration of SARS-CoV-2 viral copies this assay can detect is 250 copies / mL. A negative result does not preclude SARS-CoV-2 infection and should not be used as the sole basis for treatment or other patient management decisions.  A negative result may occur with improper specimen collection / handling, submission of specimen other than nasopharyngeal swab, presence of viral mutation(s) within the areas targeted by this assay, and inadequate number of viral copies (<250 copies / mL). A negative result must be combined with clinical observations, patient history, and epidemiological information.  Fact Sheet for Patients:   RoadLapTop.co.za  Fact Sheet for Healthcare Providers: http://kim-miller.com/  This test is not yet approved or  cleared by the [place] FDA and has been authorized for  detection and/or diagnosis of SARS-CoV-2 by FDA under an Emergency Use Authorization (EUA).  This EUA will remain in effect (meaning this test can be used) for the duration of the COVID-19 declaration under Section 564(b)(1) of the Act, 21 U.S.C. section 360bbb-3(b)(1), unless the authorization is terminated or revoked sooner.  Performed at [facility], [address] [place], [place] [address]          Radiology Studies: CT CHEST ABDOMEN PELVIS WO CONTRAST  Result Date: 10/02/2022 CLINICAL DATA:  Sepsis EXAM: CT CHEST, ABDOMEN AND PELVIS WITHOUT CONTRAST TECHNIQUE: Multidetector CT imaging of the chest, abdomen and pelvis was performed following the standard protocol without IV contrast. RADIATION DOSE REDUCTION: This exam was performed according to the departmental dose-optimization program which includes automated exposure control, adjustment of the mA and/or kV according to patient size and/or use of iterative reconstruction technique. COMPARISON:  09/04/2021, 06/13/2021 FINDINGS: CT CHEST FINDINGS Cardiovascular: Cardiomegaly. No pericardial effusion. Thoracic aorta is nonaneurysmal. Atherosclerotic calcification of the aorta and coronary arteries. Main pulmonary artery measures 3.2 cm in diameter. Mediastinum/Nodes: No axillary, mediastinal, or hilar lymphadenopathy. Thyroid gland is enlarged and heterogeneous. Trachea and esophagus demonstrate no significant abnormality. Lungs/Pleura: Mild dependent subsegmental atelectasis. Lungs are otherwise clear. No pleural effusion or pneumothorax. Musculoskeletal: No chest wall mass or suspicious bone lesions identified. Advanced degenerative changes of the right glenohumeral joint. CT ABDOMEN PELVIS FINDINGS Hepatobiliary: No focal liver abnormality is seen. No gallstones, gallbladder wall thickening, or biliary dilatation. Pancreas: Unremarkable. No pancreatic ductal dilatation or surrounding inflammatory changes. Spleen:  Normal in size without focal abnormality. Adrenals/Urinary Tract: Adrenal glands are unremarkable. Kidneys are normal, without renal calculi, focal lesion, or hydronephrosis. Bladder is unremarkable. Stomach/Bowel: Stomach is within normal limits. Scattered colonic diverticulosis. No evidence of bowel wall thickening, distention, or inflammatory changes. Surgical clip in the proximal duodenum is unchanged. Vascular/Lymphatic: Aortic atherosclerosis. No enlarged abdominal or pelvic lymph nodes. Reproductive: Status post hysterectomy. No adnexal masses. Other: No free fluid. No abdominopelvic fluid collection. No pneumoperitoneum. No abdominal wall hernia. Musculoskeletal: No acute or significant osseous findings. Chronic degenerative changes at L4-5 where there is unchanged degree of anterolisthesis. IMPRESSION: 1. No acute findings within the chest, abdomen, or pelvis. 2. Cardiomegaly. 3. Colonic diverticulosis without evidence of acute diverticulitis. 4. Aortic and coronary artery atherosclerosis (ICD10-I70.0). 5. Enlarged and heterogeneous thyroid gland. Recommend nonemergent thyroid ultrasound (ref: J Am Coll  Radiol. 2015 Feb;12(2): 143-50). Electronically Signed   By: Duanne Guess D.O.   On: 10/02/2022 18:22           LOS: 1 day   Time spent= 35 mins     Joline Maxcy, MD [facility]  If 7PM-7AM, please contact night-coverage  10/04/2022, 9:34 AM

## 2022-10-04 NOTE — Progress Notes (Signed)
   10/04/22 2308  BiPAP/CPAP/SIPAP  Mask Type Nasal mask  Mask Size Medium  Respiratory Rate 20 breaths/min  IPAP 12 cmH20  EPAP 5 cmH2O  Flow Rate 5 lpm  Patient Home Equipment No  Auto Titrate No  CPAP/SIPAP surface wiped down Yes  BiPAP/CPAP /SiPAP Vitals  Pulse Rate 73  Resp 20  SpO2 96 %  Bilateral Breath Sounds Diminished

## 2022-10-04 NOTE — Plan of Care (Signed)

## 2022-10-05 ENCOUNTER — Encounter (HOSPITAL_COMMUNITY): Payer: Self-pay

## 2022-10-05 DIAGNOSIS — R112 Nausea with vomiting, unspecified: Secondary | ICD-10-CM | POA: Diagnosis not present

## 2022-10-05 LAB — GLUCOSE, CAPILLARY
Glucose-Capillary: 107 mg/dL — ABNORMAL HIGH (ref 70–99)
Glucose-Capillary: 133 mg/dL — ABNORMAL HIGH (ref 70–99)
Glucose-Capillary: 163 mg/dL — ABNORMAL HIGH (ref 70–99)
Glucose-Capillary: 137 mg/dL — ABNORMAL HIGH (ref 70–99)

## 2022-10-05 MED ORDER — SODIUM CHLORIDE 0.9 % IV SOLN: INTRAVENOUS | Status: DC

## 2022-10-05 NOTE — Evaluation (Signed)
Occupational Therapy Evaluation Patient Details Name: [name] [name] MRN: [id number] DOB: Sep 21, 1955 Today's Date: 10/05/2022   History of Present Illness [name] [name] is a 67 yr old female admitted to the hospital with nausea, vomiting, and abdominal pain. PMH: heart failure, HTN, OSA, chronic respiratory failure, DM, CKD 3. HLD, obesity, neuropathy   Clinical Impression   The pt currently presents with the below listed deficits which compromise her ADL performance (see OT problem list). Today, she required min assist for rolling in bed and max assist for simulated lower body dressing. She reports using 4L O2 at her baseline at home, stating "when I put it on." She expressed a desire to get over her fear of falling, so that she can maximize her functional abilities. OT will follow her during her acute stay, in order to facilitate improved ADL performance and to decrease the risk for further weakness and deconditioning. She desires home health therapy upon her return home.       If plan is discharge home, recommend the following: Assist for transportation;Assistance with cooking/housework;A lot of help with bathing/dressing/bathroom;A lot of help with walking and/or transfers    Functional Status Assessment  Patient has had a recent decline in their functional status and demonstrates the ability to make significant improvements in function in a reasonable and predictable amount of time.  Equipment Recommendations  None recommended by OT    Recommendations for Other Services       Precautions / Restrictions Precautions Precautions: Fall Restrictions Weight Bearing Restrictions: No      Mobility Bed Mobility Overal bed mobility: Needs Assistance Bed Mobility: Rolling Rolling: Min assist, Used rails         General bed mobility comments: required min assist to roll right and CGA to roll left              ADL either performed or assessed with clinical judgement   ADL  Overall ADL's : Needs assistance/impaired Eating/Feeding: Independent;Bed level Eating/Feeding Details (indicate cue type and reason): based on clinical judgement Grooming: Set up;Bed level           Upper Body Dressing : Minimal assistance;Bed level   Lower Body Dressing: Maximal assistance;Bed level Lower Body Dressing Details (indicate cue type and reason): simulated     Toileting- Clothing Manipulation and Hygiene: Maximal assistance;Bed level Toileting - Clothing Manipulation Details (indicate cue type and reason): based on clinical judgement                          Pertinent Vitals/Pain Pain Assessment Pain Assessment: 0-10 Pain Score: 9  Pain Location: feet due to reported chronic neuropathy Pain Intervention(s): Limited activity within patient's tolerance, Monitored during session     Extremity/Trunk Assessment Upper Extremity Assessment Upper Extremity Assessment: Overall WFL for tasks assessed   Lower Extremity Assessment Lower Extremity Assessment: Defer to PT evaluation       Communication Communication Communication: No apparent difficulties   Cognition Arousal: Alert Behavior During Therapy: [facility] for tasks assessed/performed      General Comments: Oriented to person, place, month, year, and situation. Able to follow 1-2 step commands without difficulty                Home Living Family/patient expects to be discharged to:: Private residence Living Arrangements: Alone Available Help at Discharge: Personal care attendant;Friend(s) Type of Home: House Home Access: Level entry     Home Layout: One level  Home Equipment: Wheelchair - manual;Hospital bed;Other (comment) Michiel Sites lift)   Additional Comments: She has home health aides 7 days per week for 3 hrs in the a.m. and 2 hrs in the p.m.      Prior Functioning/Environment Prior Level of Function : Needs assist             Mobility Comments: She has not  ambulated in ~4 yrs after she fell and broke her leg; she indicated fear of falling as a factor in her no longer being able to ambulate. Her home health aides use the hoyer lift to transfer her to & from her recliner. ADLs Comments: Pt reports self-feeding independently, her home health aides assisted with bed baths, changing her briefs in bed, and dressing her in bed. She mostly has meals delivered to her, however she also stated her sister will sometimes drop off food. She also reported having a friend who performs her cleaning and does her grocery shopping.        OT Problem List: Decreased strength;Decreased range of motion;Decreased activity tolerance;Impaired balance (sitting and/or standing);Decreased knowledge of use of DME or AE;Cardiopulmonary status limiting activity;Obesity;Pain      OT Treatment/Interventions: Self-care/ADL training;Therapeutic exercise;Energy conservation;DME and/or AE instruction;Therapeutic activities;Balance training;Patient/family education    OT Goals(Current goals can be found in the care plan section) Acute Rehab OT Goals Patient Stated Goal: to be able to walk; if unable to walk again, she would like to be able to stand & perform stand-pivot transfers OT Goal Formulation: With patient Time For Goal Achievement: 10/19/22 Potential to Achieve Goals: Fair ADL Goals Pt Will Perform Grooming: with set-up;sitting Pt Will Perform Upper Body Bathing: with set-up;sitting Pt Will Perform Upper Body Dressing: with set-up;sitting Additional ADL Goal #1: The pt will perform bed mobility with supervision, in prep for progressive ADL participation.  OT Frequency: Min 1X/week    Co-evaluation PT/OT/SLP Co-Evaluation/Treatment: Yes Reason for Co-Treatment: To address functional/ADL transfers PT goals addressed during session: Mobility/safety with mobility OT goals addressed during session: ADL's and self-care      AM-PAC OT "6 Clicks" Daily Activity     Outcome  Measure Help from another person eating meals?: None Help from another person taking care of personal grooming?: A Little Help from another person toileting, which includes using toliet, bedpan, or urinal?: A Lot Help from another person bathing (including washing, rinsing, drying)?: A Lot Help from another person to put on and taking off regular upper body clothing?: A Little Help from another person to put on and taking off regular lower body clothing?: A Lot 6 Click Score: 16   End of Session Equipment Utilized During Treatment: Oxygen Nurse Communication: Other (comment)  Activity Tolerance: Other (comment) (Fair+ tolerance) Patient left: in bed;with call bell/phone within reach  OT Visit Diagnosis: Muscle weakness (generalized) (M62.81);Pain                Time: 3875-6433 OT Time Calculation (min): 28 min Charges:  OT General Charges $OT Visit: 1 Visit OT Evaluation $OT Eval Moderate Complexity: 1 Mod     L , OTR/L 10/05/2022, 12:52 PM

## 2022-10-05 NOTE — Progress Notes (Addendum)
   10/05/22 2330  BiPAP/CPAP/SIPAP  Reason BIPAP/CPAP not in use Non-compliant (pt refused bipap)   Pt refused bipap tonight.  Pt stated she wants to wear the nasal cannula instead.  Pt was encouraged to call should she change her mind.  RN aware.

## 2022-10-05 NOTE — Progress Notes (Signed)
PROGRESS NOTE    [name] [name]  UJW:[id number] DOB: [birth year] DOA: 10/02/2022 PCP: [name]   Brief Narrative:  67 year old with history of diastolic CHF, OSA on CPAP, chronic hypoxia on 4 L nasal cannula, insulin-dependent DM2, CKD stage IIIb, anxiety/depression comes to the hospital with nausea and vomiting.  CT abdomen pelvis did not show any acute pathology.  Initially reported of diarrhea but none since being in the hospital.  Recently completed a course for doxycycline.   Assessment & Plan:  Principal Problem:   Intractable nausea and vomiting Active Problems:   (HFpEF) heart failure with preserved ejection fraction (HCC)   Type 2 diabetes mellitus with stage 3 chronic kidney disease (HCC)   Hyperlipidemia associated with type 2 diabetes mellitus (HCC)   OSA on CPAP   Morbid obesity with BMI of 50.0-59.9, adult (HCC)   Chronic kidney disease, stage 3b (HCC)     Assessment and Plan: * Intractable nausea and vomiting Marijuana use CT abdomen pelvis is negative for acute pathology.  No diarrhea being assessed in the hospital.  COVID test is negative.  Unable to collect GI panel/C. difficile sample.  Diet as tolerated.  She does use marijuana therefore this could be the culprit of cyclical vomiting syndrome. Antiemetics as needed, hydration.  (HFpEF) heart failure with preserved ejection fraction (HCC) EF of 60-65% and grade 1 diastolic dysfunction   AKI on chronic kidney disease, stage 3b (HCC) Creatinine around baseline of 1.5 > 1.9.  Will continue IV fluids, bladder scan.  Morbid obesity with BMI of 50.0-59.9, adult (HCC) BMI of 51  OSA on CPAP -pt declines CPAP for tonight  Hyperlipidemia associated with type 2 diabetes mellitus (HCC) -continue statin  Type 2 diabetes mellitus with stage 3 chronic kidney disease (HCC) -A1C 10.3 in March -on 50 units Semglee at home -place on SSI for now since she has not be able to tolerate much oral  intake  Peripheral neuropathy - Continue Lyrica.  She reporting of significant neuropathy pain, gabapentin as needed  Anxiety - On Myrbetriq, Lexapro, Atarax  PT/OT  DVT prophylaxis: Lovenox Code Status: Full code Family Communication:   Status is: Inpatient Still having persistent nausea vomiting, poor oral tolerance.  AKI, getting IV fluids.       Diet Orders (From admission, onward)     Start     Ordered   10/04/22 1220  DIET SOFT Room service appropriate? Yes; Fluid consistency: Thin  Diet effective now       Question Answer Comment  Room service appropriate? Yes   Fluid consistency: Thin      10/04/22 1219            Subjective: Still having some neuropathy pain in bilateral lower extremity but tells me it feels little better  Examination:  General exam: Appears calm and comfortable, morbid obesity Respiratory system: Clear to auscultation. Respiratory effort normal. Cardiovascular system: S1 & S2 heard, RRR. No JVD, murmurs, rubs, gallops or clicks. No pedal edema. Gastrointestinal system: Abdomen is nondistended, soft and nontender. No organomegaly or masses felt. Normal bowel sounds heard. Central nervous system: Alert and oriented. No focal neurological deficits. Extremities: Symmetric 5 x 5 power. Skin: No rashes, lesions or ulcers Psychiatry: Judgement and insight appear normal. Mood & affect appropriate.  Objective: Vitals:   10/04/22 1959 10/04/22 2308 10/04/22 2353 10/05/22 0517  BP: (!) 95/50   115/65  Pulse: 80 73 76 72  Resp: 19 20 20 18   Temp: 98.5 F (36.9  C)   97.8 F (36.6 C)  TempSrc: Oral     SpO2: 93% 96% 93% 99%  Weight:      Height:        Intake/Output Summary (Last 24 hours) at 10/05/2022 0927 Last data filed at 10/05/2022 0600 Gross per 24 hour  Intake 865 ml  Output 300 ml  Net 565 ml   Filed Weights   10/02/22 1453  Weight: 128.4 kg    Scheduled Meds:  atorvastatin  10 mg Oral QHS   cholecalciferol  1,000  Units Oral Daily   enoxaparin (LOVENOX) injection  60 mg Subcutaneous QHS   escitalopram  10 mg Oral q morning   hydrOXYzine  25 mg Oral BID   insulin aspart  0-20 Units Subcutaneous TID PC & HS   loratadine  10 mg Oral Daily   mirabegron ER  25 mg Oral Daily   pantoprazole  40 mg Oral QAC breakfast   potassium chloride SA  20 mEq Oral Daily   pregabalin  75 mg Oral BID   Continuous Infusions:  sodium chloride     promethazine (PHENERGAN) injection (IM or IVPB) 12.5 mg (10/03/22 1735)    Nutritional status     Body mass index is 51.76 kg/m.  Data Reviewed:   CBC: Recent Labs  Lab 10/02/22 1500 10/03/22 0536 10/04/22 0535 10/05/22 0518  WBC 8.9 7.1 5.8 5.8  NEUTROABS 6.8  --   --   --   HGB 16.2* 14.4 14.3 13.0  HCT 56.9* 50.4* 51.0* 46.9*  MCV 84.3 84.6 85.6 86.5  PLT 441* 405* 374 359   Basic Metabolic Panel: Recent Labs  Lab 10/02/22 1500 10/03/22 0536 10/04/22 0535 10/05/22 0518  NA 144 144 144 138  K 4.3 3.8 4.2 4.2  CL 103 106 106 105  CO2 30 28 28 25   GLUCOSE 135* 129* 106* 115*  BUN 26* 25* 23 26*  CREATININE 1.57* 1.52* 1.58* 1.98*  CALCIUM 9.0 8.3* 9.0 8.5*  MG  --   --  1.5* 2.1  PHOS  --   --   --  3.6   GFR: Estimated Creatinine Clearance: 34.5 mL/min (A) (by C-G formula based on SCr of 1.98 mg/dL (H)). Liver Function Tests: Recent Labs  Lab 10/02/22 1500 10/04/22 0535 10/05/22 0518  AST 17 12* 14*  ALT 16 14 15   ALKPHOS 161* 125 107  BILITOT 0.7 0.6 0.9  PROT 8.5* 6.8 6.1*  ALBUMIN 3.4* 2.9* 2.7*   Recent Labs  Lab 10/02/22 1500  LIPASE 56*   No results for input(s): "AMMONIA" in the last 168 hours. Coagulation Profile: No results for input(s): "INR", "PROTIME" in the last 168 hours. Cardiac Enzymes: No results for input(s): "CKTOTAL", "CKMB", "CKMBINDEX", "TROPONINI" in the last 168 hours. BNP (last 3 results) No results for input(s): "PROBNP" in the last 8760 hours. HbA1C: Recent Labs    10/03/22 0536  HGBA1C 9.3*    CBG: Recent Labs  Lab 10/04/22 0804 10/04/22 1125 10/04/22 1631 10/04/22 2000 10/05/22 0820  GLUCAP 116* 101* 124* 158* 107*   Lipid Profile: No results for input(s): "CHOL", "HDL", "LDLCALC", "TRIG", "CHOLHDL", "LDLDIRECT" in the last 72 hours. Thyroid Function Tests: Recent Labs    10/03/22 1245  TSH 3.471   Anemia Panel: No results for input(s): "VITAMINB12", "FOLATE", "FERRITIN", "TIBC", "IRON", "RETICCTPCT" in the last 72 hours. Sepsis Labs: No results for input(s): "PROCALCITON", "LATICACIDVEN" in the last 168 hours.  Recent Results (from the past 240 hour(s))  SARS  Coronavirus 2 by RT PCR (hospital order, performed in [facility] hospital lab) *cepheid single result test* Anterior Nasal Swab     Status: None   Collection Time: 10/02/22 10:52 PM   Specimen: Anterior Nasal Swab  Result Value Ref Range Status   SARS Coronavirus 2 by RT PCR NEGATIVE NEGATIVE Final    Comment: (NOTE) SARS-CoV-2 target nucleic acids are NOT DETECTED.  The SARS-CoV-2 RNA is generally detectable in upper and lower respiratory specimens during the acute phase of infection. The lowest concentration of SARS-CoV-2 viral copies this assay can detect is 250 copies / mL. A negative result does not preclude SARS-CoV-2 infection and should not be used as the sole basis for treatment or other patient management decisions.  A negative result may occur with improper specimen collection / handling, submission of specimen other than nasopharyngeal swab, presence of viral mutation(s) within the areas targeted by this assay, and inadequate number of viral copies (<250 copies / mL). A negative result must be combined with clinical observations, patient history, and epidemiological information.  Fact Sheet for Patients:   RoadLapTop.co.za  Fact Sheet for Healthcare Providers: http://kim-miller.com/  This test is not yet approved or  cleared by the [place] FDA and has been authorized for detection and/or diagnosis of SARS-CoV-2 by FDA under an Emergency Use Authorization (EUA).  This EUA will remain in effect (meaning this test can be used) for the duration of the COVID-19 declaration under Section 564(b)(1) of the Act, 21 U.S.C. section 360bbb-3(b)(1), unless the authorization is terminated or revoked sooner.  Performed at [facility], [address] [place], [place] [address]          Radiology Studies: No results found.         LOS: 2 days   Time spent= 35 mins     Joline Maxcy, MD [facility]  If 7PM-7AM, please contact night-coverage  10/05/2022, 9:27 AM

## 2022-10-05 NOTE — Evaluation (Signed)
Physical Therapy Evaluation Patient Details Name: [name] [name] MRN: [id number] DOB: [birth year] Today's Date: 10/05/2022  History of Present Illness  [name] [name] is a 67 yr old female admitted to the hospital with nausea, vomiting, and abdominal pain. PMH: heart failure, HTN, OSA, chronic respiratory failure, DM, CKD 3. HLD, obesity, neuropathy  Clinical Impression  Pt admitted with above diagnosis.  Pt currently with functional limitations due to the deficits listed below (see PT Problem List). Pt will benefit from acute skilled PT to increase their independence and safety with mobility to allow discharge.   The patient reports bed and recliner bound, using Hoyer lift for transfers by CNA/caregivers. Patient is home alone many hours per day, ?has another  friend assisting. Patient desires return home with same situation. Patient could benefit from increased caregiver support.        If plan is discharge home, recommend the following: A lot of help with walking and/or transfers;A little help with bathing/dressing/bathroom;Assistance with cooking/housework;Assist for transportation   Can travel by private vehicle        Equipment Recommendations None recommended by PT  Recommendations for Other Services       Functional Status Assessment Patient has not had a recent decline in their functional status     Precautions / Restrictions Precautions Precautions: Fall Precaution Comments: hoyer lift at  baseline, has not stood x 4 yrs Restrictions Weight Bearing Restrictions: No      Mobility  Bed Mobility   Bed Mobility: Rolling Rolling: Min assist, Used rails         General bed mobility comments: required min assist to roll right and CGA to roll left    Transfers                   General transfer comment: uses hoyer    Ambulation/Gait                  Stairs            Wheelchair Mobility     Tilt Bed    Modified Rankin (Stroke Patients  Only)       Balance                                             Pertinent Vitals/Pain Pain Assessment Pain Score: 9  Pain Location: feet due to reported chronic neuropathy, especially right  met head when touched Pain Descriptors / Indicators: Aching, Discomfort, Guarding    Home Living Family/patient expects to be discharged to:: Private residence Living Arrangements: Alone Available Help at Discharge: Personal care attendant;Friend(s) Type of Home: House Home Access: Level entry       Home Layout: One level Home Equipment: Wheelchair - manual;Hospital bed;Other (comment) (Hoyer lift) Additional Comments: She has home health aides 7 days per week for 3 hrs in the a.m. and 2 hrs in the p.m., recently had another friend assisting buhas  not been availble latrely    Prior Function Prior Level of Function : Needs assist             Mobility Comments: She has not ambulated in ~4 yrs after she fell and broke her leg; she indicated fear of falling as a factor in her no longer being able to ambulate. Her home health aides use the hoyer lift to transfer her to & from her recliner.  uses transportation by stretcher to appointments ADLs Comments: Pt reports self-feeding independently, her home health aides assisted with bed baths, changing her briefs in bed, and dressing her in bed. She mostly has meals delivered to her, however she also stated her sister will sometimes drop off food. She also reported having a friend who performs her cleaning and does her grocery shopping.     Extremity/Trunk Assessment   Upper Extremity Assessment Upper Extremity Assessment: Overall WFL for tasks assessed    Lower Extremity Assessment Lower Extremity Assessment: RLE deficits/detail;LLE deficits/detail RLE Deficits / Details: active dorsi/plantar flex, contractures on plantarfelx, knee flex max 40 * LLE Deficits / Details: shortened and ER, knee flexion to ~ 60 *    Cervical  / Trunk Assessment Cervical / Trunk Assessment: Other exceptions Cervical / Trunk Exceptions: body habitus  Communication   Communication Communication: No apparent difficulties  Cognition Arousal: Alert Behavior During Therapy: WFL for tasks assessed/performed                                   General Comments: Oriented to person, place, month, year, and situation. Able to follow 1-2 step commands without difficultyinitally falling asleep  between questions, then remained alert        General Comments General comments (skin integrity, edema, etc.): NT    Exercises     Assessment/Plan    PT Assessment Patient needs continued PT services  PT Problem List Decreased strength;Decreased activity tolerance;Decreased mobility;Obesity;Impaired sensation;Decreased range of motion       PT Treatment Interventions Therapeutic activities;Functional mobility training;Therapeutic exercise;Patient/family education    PT Goals (Current goals can be found in the Care Plan section)  Acute Rehab PT Goals Patient Stated Goal: to take care of myself, transfer to a toilet PT Goal Formulation: With patient Time For Goal Achievement: 10/19/22 Potential to Achieve Goals: Fair    Frequency Min 1X/week     Co-evaluation PT/OT/SLP Co-Evaluation/Treatment: Yes Reason for Co-Treatment: To address functional/ADL transfers PT goals addressed during session: Mobility/safety with mobility OT goals addressed during session: ADL's and self-care       AM-PAC PT "6 Clicks" Mobility  Outcome Measure Help needed turning from your back to your side while in a flat bed without using bedrails?: A Little Help needed moving from lying on your back to sitting on the side of a flat bed without using bedrails?: Total Help needed moving to and from a bed to a chair (including a wheelchair)?: Total Help needed standing up from a chair using your arms (e.g., wheelchair or bedside chair)?:  Total Help needed to walk in hospital room?: Total Help needed climbing 3-5 steps with a railing? : Total 6 Click Score: 8    End of Session Equipment Utilized During Treatment: Oxygen Activity Tolerance: Patient tolerated treatment well Patient left: in bed;with call bell/phone within reach;with bed alarm set Nurse Communication: Mobility status PT Visit Diagnosis: Muscle weakness (generalized) (M62.81);Difficulty in walking, not elsewhere classified (R26.2)    Time: 3244-0102 PT Time Calculation (min) (ACUTE ONLY): 28 min   Charges:   PT Evaluation $PT Eval Low Complexity: 1 Low   PT General Charges $$ ACUTE PT VISIT: 1 Visit         Blanchard Kelch PT Acute Rehabilitation Services Office [phone] Weekend [phone]   Rada Hay 10/05/2022, 1:21 PM

## 2022-10-06 DIAGNOSIS — R112 Nausea with vomiting, unspecified: Secondary | ICD-10-CM | POA: Diagnosis not present

## 2022-10-06 LAB — CBC
HCT: 49.2 % — ABNORMAL HIGH (ref 36.0–46.0)
Hemoglobin: 13.6 g/dL (ref 12.0–15.0)
MCH: 24.2 pg — ABNORMAL LOW (ref 26.0–34.0)
MCHC: 27.6 g/dL — ABNORMAL LOW (ref 30.0–36.0)
MCV: 87.7 fL (ref 80.0–100.0)
Platelets: 339 10*3/uL (ref 150–400)
RBC: 5.61 MIL/uL — ABNORMAL HIGH (ref 3.87–5.11)
RDW: 18.6 % — ABNORMAL HIGH (ref 11.5–15.5)
WBC: 5.9 10*3/uL (ref 4.0–10.5)
nRBC: 0 % (ref 0.0–0.2)

## 2022-10-06 LAB — COMPREHENSIVE METABOLIC PANEL
ALT: 21 U/L (ref 0–44)
AST: 18 U/L (ref 15–41)
Albumin: 2.8 g/dL — ABNORMAL LOW (ref 3.5–5.0)
Alkaline Phosphatase: 123 U/L (ref 38–126)
Anion gap: 7 (ref 5–15)
BUN: 28 mg/dL — ABNORMAL HIGH (ref 8–23)
CO2: 24 mmol/L (ref 22–32)
Calcium: 8.6 mg/dL — ABNORMAL LOW (ref 8.9–10.3)
Chloride: 108 mmol/L (ref 98–111)
Creatinine, Ser: 1.98 mg/dL — ABNORMAL HIGH (ref 0.44–1.00)
GFR, Estimated: 27 mL/min — ABNORMAL LOW (ref >60)
Glucose, Bld: 129 mg/dL — ABNORMAL HIGH (ref 70–99)
Potassium: 4.5 mmol/L (ref 3.5–5.1)
Sodium: 139 mmol/L (ref 135–145)
Total Bilirubin: 0.6 mg/dL (ref 0.3–1.2)
Total Protein: 6.4 g/dL — ABNORMAL LOW (ref 6.5–8.1)

## 2022-10-06 LAB — GLUCOSE, CAPILLARY
Glucose-Capillary: 132 mg/dL — ABNORMAL HIGH (ref 70–99)
Glucose-Capillary: 177 mg/dL — ABNORMAL HIGH (ref 70–99)
Glucose-Capillary: 151 mg/dL — ABNORMAL HIGH (ref 70–99)
Glucose-Capillary: 219 mg/dL — ABNORMAL HIGH (ref 70–99)

## 2022-10-06 LAB — MAGNESIUM: Magnesium: 2.1 mg/dL (ref 1.7–2.4)

## 2022-10-06 MED ORDER — SODIUM CHLORIDE 0.9 % IV SOLN: INTRAVENOUS | Status: AC

## 2022-10-06 NOTE — Inpatient Diabetes Management (Signed)
Inpatient Diabetes Program Recommendations  AACE/ADA: New Consensus Statement on Inpatient Glycemic Control (2015)  Target Ranges:  Prepandial:   less than 140 mg/dL      Peak postprandial:   less than 180 mg/dL (1-2 hours)      Critically ill patients:  140 - 180 mg/dL   Lab Results  Component Value Date   GLUCAP 177 (H) 10/06/2022   HGBA1C 9.3 (H) 10/03/2022    Review of Glycemic Control  Diabetes history: DM2 Outpatient Diabetes medications: Semglee 50 every day, Ozempic 2 mg weekly Current orders for Inpatient glycemic control: Novolog 0-20 TID with meals and 0-5 HS  HgbA1C 9.3% (improved from 10.3% in March 2024) Blood sugars trending well. PCP [facility]  Inpatient Diabetes Program Recommendations:    Agree with Novolog correction for now, poor po intake.  Spoke with pt and family regarding her diabetes control, home meds confirmed. Pt states she rarely has hypoglycemia, discussed s/s and treatment. Discussed A1C results (9.3% on8/12/24 ) and explained that current A1C indicates an average glucose of 220 mg/dl over the past 2-3 months. Discussed glucose and A1C goals. Discussed importance of checking CBGs and maintaining good CBG control to prevent long-term and short-term complications. Explained how hyperglycemia leads to damage within blood vessels which lead to the common complications seen with uncontrolled diabetes. Stressed to the patient the importance of improving glycemic control to prevent further complications from uncontrolled diabetes. Discussed impact of nutrition, exercise, stress, sickness, and medications on diabetes control. Answered all questions.  Thank you. Ailene Ards, RD, LDN, CDCES Inpatient Diabetes Coordinator [phone]

## 2022-10-06 NOTE — Plan of Care (Signed)
  Problem: Education: Goal: Ability to describe self-care measures that may prevent or decrease complications (Diabetes Survival Skills Education) will improve Outcome: Not Progressing   Problem: Coping: Goal: Ability to adjust to condition or change in health will improve Outcome: Progressing   Problem: Nutritional: Goal: Maintenance of adequate nutrition will improve Outcome: Progressing   Problem: Skin Integrity: Goal: Risk for impaired skin integrity will decrease Outcome: Progressing

## 2022-10-06 NOTE — Plan of Care (Signed)
  Problem: Coping: Goal: Ability to adjust to condition or change in health will improve Outcome: Progressing   Problem: Nutritional: Goal: Maintenance of adequate nutrition will improve Outcome: Progressing   Problem: Coping: Goal: Level of anxiety will decrease Outcome: Progressing   Problem: Safety: Goal: Ability to remain free from injury will improve Outcome: Progressing   Problem: Skin Integrity: Goal: Risk for impaired skin integrity will decrease Outcome: Progressing

## 2022-10-06 NOTE — Progress Notes (Signed)
   10/06/22 2230  BiPAP/CPAP/SIPAP  Reason BIPAP/CPAP not in use Non-compliant (pt refused bipap)   Pt refused bipap tonight, prefers to wear nasal cannula instead.  Pt was encouraged to call should she change her mind.  RN aware.  Bipap machine remains in room.

## 2022-10-06 NOTE — TOC Initial Note (Signed)
Transition of Care [facility]) - Initial/Assessment Note    Patient Details  Name: [name] [name] MRN: [id number] Date of Birth: 1955-11-16  Transition of Care [facility]) CM/SW Contact:    Otelia Santee, LCSW Phone Number: 10/06/2022, 2:30 PM  Clinical Narrative:                 Met with pt and confirmed plan for home health services. Pt shares she resides in an apartment. Pt has a RW and receives 35 hours of aide services a week. HHPT/OT has been arranged with [facility]. Pt also requesting a cane. MD notified.   Expected Discharge Plan: Home w Home Health Services Barriers to Discharge: No Barriers Identified   Patient Goals and CMS Choice Patient states their goals for this hospitalization and ongoing recovery are:: To return home CMS Medicare.gov Compare Post Acute Care list provided to:: Patient Choice offered to / list presented to : Patient [place] ownership interest in [facility].provided to::  (NA)    Expected Discharge Plan and Services In-house Referral: Clinical Social Work Discharge Planning Services: NA Post Acute Care Choice: Home Health, Durable Medical Equipment Living arrangements for the past 2 months: Apartment                           HH Arranged: PT, OT HH Agency: [facility] Date [facility] Contacted: 10/06/22 Time HH Agency Contacted: [phone] Representative spoke with at [facility] Agency: Angie  Prior Living Arrangements/Services Living arrangements for the past 2 months: Apartment Lives with:: Self Patient language and need for interpreter reviewed:: Yes Do you feel safe going back to the place where you live?: Yes      Need for Family Participation in Patient Care: No (Comment) Care giver support system in place?: Yes (comment) Current home services: DME, Homehealth aide (RW, 35 hrs of aide services) Criminal Activity/Legal Involvement Pertinent to Current Situation/Hospitalization: No - Comment as needed  Activities of Daily  Living Home Assistive Devices/Equipment: Eyeglasses ADL Screening (condition at time of admission) Patient's cognitive ability adequate to safely complete daily activities?: Yes Is the patient deaf or have difficulty hearing?: No Does the patient have difficulty seeing, even when wearing glasses/contacts?: No Does the patient have difficulty concentrating, remembering, or making decisions?: No Patient able to express need for assistance with ADLs?: No Does the patient have difficulty dressing or bathing?: Yes Independently performs ADLs?: No Communication: Independent Dressing (OT): Needs assistance Is this a change from baseline?: Pre-admission baseline Grooming: Needs assistance Is this a change from baseline?: Pre-admission baseline Feeding: Independent Bathing: Needs assistance Is this a change from baseline?: Pre-admission baseline Toileting: Needs assistance Is this a change from baseline?: Pre-admission baseline In/Out Bed: Needs assistance Is this a change from baseline?: Pre-admission baseline Walks in Home: Dependent Is this a change from baseline?: Pre-admission baseline Does the patient have difficulty walking or climbing stairs?: Yes Weakness of Legs: Both Weakness of Arms/Hands: None  Permission Sought/Granted   Permission granted to share information with : No              Emotional Assessment Appearance:: Appears stated age Attitude/Demeanor/Rapport: Engaged Affect (typically observed): Accepting Orientation: : Oriented to Self, Oriented to Place, Oriented to  Time, Oriented to Situation Alcohol / Substance Use: Not Applicable Psych Involvement: No (comment)  Admission diagnosis:  Dehydration [E86.0] Intractable nausea and vomiting [R11.2] Nausea and vomiting, unspecified vomiting type [R11.2] Patient Active Problem List   Diagnosis Date  Noted   Chronic kidney disease, stage 3a (HCC) 10/02/2022   Chronic kidney disease, stage 3b (HCC) 10/02/2022    Hyperglycemia due to type 2 diabetes mellitus (HCC) 09/26/2021   Type 2 diabetes mellitus with hyperglycemia, with long-term current use of insulin (HCC) 09/25/2021   (HFpEF) heart failure with preserved ejection fraction (HCC) 09/25/2021   Hypomagnesemia 09/08/2021   Severe sepsis (HCC) 09/07/2021   Bacteremia due to Klebsiella pneumoniae 09/07/2021   Urinary tract infection 09/07/2021   Acute respiratory failure with hypoxia and hypercapnia (HCC) 09/04/2021   DKA (diabetic ketoacidosis) (HCC) 02/02/2021   Morbid obesity with BMI of 50.0-59.9, adult (HCC) 02/02/2021   Hyperglycemia 02/01/2021   Pancreatitis 09/07/2020   Intractable nausea and vomiting 09/07/2020   Functional quadriplegia (HCC) 09/07/2020   Physical debility 08/26/2020   OSA on CPAP 08/26/2020   Constipation    Acute renal failure superimposed on stage 3a chronic kidney disease, unspecified acute renal failure type (HCC) 08/25/2020   Acute renal failure superimposed on stage 3b chronic kidney disease (HCC) 07/30/2020   Lower extremity cellulitis 02/04/2020   Acute on chronic diastolic CHF (congestive heart failure) (HCC) 11/23/2019   Vaginal discharge 11/06/2019   Morbid obesity (HCC) 08/22/2019   Opiate overdose (HCC) 08/20/2019   Cellulitis 10/05/2018   Acute cystitis without hematuria 10/05/2018   Essential hypertension 10/05/2018   Pressure injury of skin 10/05/2018   Hypoglycemia secondary to sulfonylurea 11/21/2016   Substance induced mood disorder (HCC) 07/02/2014   Encephalopathy    Hyperkalemia    Hypokalemia    Type 2 diabetes mellitus with stage 3 chronic kidney disease (HCC)    Hyperlipidemia associated with type 2 diabetes mellitus (HCC)    Anxiety and depression    Anxiety state    PCP:  [name] Pharmacy:   [id number] - [place], [place] - [address] AT [address] [place] [address] Phone: [phone] Fax: [phone]     Social  Determinants of Health (SDOH) Social History: SDOH Screenings   Food Insecurity: No Food Insecurity (10/02/2022)  Housing: Low Risk  (10/02/2022)  Transportation Needs: No Transportation Needs (10/02/2022)  Utilities: Not At Risk (10/02/2022)  Financial Resource Strain: Patient Declined (05/16/2022)   Received from [facility]  Social Connections: Unknown (05/13/2022)   Received from [facility]  Stress: No Stress Concern Present (05/16/2022)   Received from [facility]  Tobacco Use: Medium Risk (10/02/2022)   SDOH Interventions:     Readmission Risk Interventions    09/28/2021   11:23 AM 09/08/2021   10:20 AM 02/06/2020    4:40 PM  Readmission Risk Prevention Plan  Transportation Screening Complete Complete Complete  PCP or Specialist Appt within 3-5 Days --    Home Care Screening  Complete   Medication Review (RN CM)  Complete   HRI or Home Care Consult Complete    Social Work Consult for Recovery Care Planning/Counseling Complete    Palliative Care Screening Not Applicable    Medication Review Oceanographer) Complete    PCP or Specialist appointment within 3-5 days of discharge   Complete  SW Recovery Care/Counseling Consult   Complete  Palliative Care Screening   Not Applicable  Skilled Nursing Facility   Complete

## 2022-10-06 NOTE — Hospital Course (Addendum)
  Brief Narrative:  67 year old with history of diastolic CHF, OSA on CPAP, chronic hypoxia on 4 L nasal cannula, insulin-dependent DM2, CKD stage IIIb, anxiety/depression comes to the hospital with nausea and vomiting.  CT abdomen pelvis did not show any acute pathology.  Initially reported of diarrhea but none since being in the hospital.  Recently completed a course for doxycycline.  Upon admission nausea vomiting and diarrhea improved.  No obvious evidence of loose stool therefore unable to run any stool studies.  Did have hospital stay complicated by AKI requiring IV fluids.  She also reported of persistent severe bilateral lower extremity neuropathic pain therefore Lyrica was resumed and as needed gabapentin was added. Over the course of 4 days in the hospital patient started feeling well, renal function has improved, denies any nausea and vomiting. Medically stable for discharge, friend also present at bedside during my instructions.     Assessment & Plan:  Principal Problem:   Intractable nausea and vomiting Active Problems:   (HFpEF) heart failure with preserved ejection fraction (HCC)   Type 2 diabetes mellitus with stage 3 chronic kidney disease (HCC)   Hyperlipidemia associated with type 2 diabetes mellitus (HCC)   OSA on CPAP   Morbid obesity with BMI of 50.0-59.9, adult (HCC)   Chronic kidney disease, stage 3b (HCC)       Assessment and Plan: * Intractable nausea and vomiting, resolved Marijuana use CT abdomen pelvis is negative for acute pathology.  No diarrhea being assessed in the hospital.  COVID test is negative.  Unable to collect GI panel/C. difficile sample.  Diet as tolerated.  She does use marijuana therefore this could be the culprit of cyclical vomiting syndrome.   Mild left breast swelling, improved - Recently diagnosed with some sort of mastitis as outpatient and was placed on doxycycline.  During my examination I do not really see evidence of infection.  Unable  to get ultrasound at this facility of her left breast.  I recommend patient follow-up outpatient with her PCP to get outpatient breast ultrasound and mammogram   (HFpEF) heart failure with preserved ejection fraction (HCC) EF of 60-65% and grade 1 diastolic dysfunction    AKI on chronic kidney disease, stage 3b (HCC) Creatinine around baseline of 1.5 > 1.9 >1.63.  No evidence of urinary retention on bladder scan   Morbid obesity with BMI of 50.0-59.9, adult (HCC) BMI of 51   OSA on CPAP -pt declines CPAP for tonight   Hyperlipidemia associated with type 2 diabetes mellitus (HCC) -continue statin   Type 2 diabetes mellitus with stage 3 chronic kidney disease (HCC) -A1C 10.3 in March -on 50 units Semglee at home -place on SSI for now since she has not be able to tolerate much oral intake   Peripheral neuropathy - Continue Lyrica.  She reporting of significant neuropathy pain, gabapentin as needed   Anxiety - On Myrbetriq, Lexapro, Atarax   PT/OT-home health services, face-to-face completed   DVT prophylaxis: Lovenox Code Status: Full code

## 2022-10-06 NOTE — Progress Notes (Signed)
PROGRESS NOTE    [name] [name]  ZOX:[id number] DOB: [birth year] DOA: 10/02/2022 PCP: [name]     Brief Narrative:  67 year old with history of diastolic CHF, OSA on CPAP, chronic hypoxia on 4 L nasal cannula, insulin-dependent DM2, CKD stage IIIb, anxiety/depression comes to the hospital with nausea and vomiting.  CT abdomen pelvis did not show any acute pathology.  Initially reported of diarrhea but none since being in the hospital.  Recently completed a course for doxycycline.  Upon admission nausea vomiting and diarrhea improved.  No obvious evidence of loose stool therefore unable to run any stool studies.  Did have hospital stay complicated by AKI requiring IV fluids.  She also reported of persistent severe bilateral lower extremity neuropathic pain therefore Lyrica was resumed and as needed gabapentin was added.     Assessment & Plan:  Principal Problem:   Intractable nausea and vomiting Active Problems:   (HFpEF) heart failure with preserved ejection fraction (HCC)   Type 2 diabetes mellitus with stage 3 chronic kidney disease (HCC)   Hyperlipidemia associated with type 2 diabetes mellitus (HCC)   OSA on CPAP   Morbid obesity with BMI of 50.0-59.9, adult (HCC)   Chronic kidney disease, stage 3b (HCC)       Assessment and Plan: * Intractable nausea and vomiting Marijuana use CT abdomen pelvis is negative for acute pathology.  No diarrhea being assessed in the hospital.  COVID test is negative.  Unable to collect GI panel/C. difficile sample.  Diet as tolerated.  She does use marijuana therefore this could be the culprit of cyclical vomiting syndrome. Antiemetics as needed, hydration.  Mild left breast swelling, improved - Recently diagnosed with some sort of mastitis as outpatient and was placed on doxycycline.  During my examination I do not really see evidence of infection.  Unable to get ultrasound at this facility of her left breast.  I recommend patient  follow-up outpatient with her PCP to get outpatient ultrasound and mammogram.  Can get warm compresses here if necessary   (HFpEF) heart failure with preserved ejection fraction (HCC) EF of 60-65% and grade 1 diastolic dysfunction    AKI on chronic kidney disease, stage 3b (HCC) Creatinine around baseline of 1.5 > 1.9.  Will continue IV fluids, bladder scan.   Morbid obesity with BMI of 50.0-59.9, adult (HCC) BMI of 51   OSA on CPAP -pt declines CPAP for tonight   Hyperlipidemia associated with type 2 diabetes mellitus (HCC) -continue statin   Type 2 diabetes mellitus with stage 3 chronic kidney disease (HCC) -A1C 10.3 in March -on 50 units Semglee at home -place on SSI for now since she has not be able to tolerate much oral intake   Peripheral neuropathy - Continue Lyrica.  She reporting of significant neuropathy pain, gabapentin as needed   Anxiety - On Myrbetriq, Lexapro, Atarax   PT/OT-home health services, face-to-face completed   DVT prophylaxis: Lovenox Code Status: Full code Family Communication:   Status is: Inpatient Still having persistent nausea vomiting, poor oral tolerance.  AKI, getting IV fluids.                 Diet Orders (From admission, onward)     Start     Ordered   10/04/22 1220  DIET SOFT Room service appropriate? Yes; Fluid consistency: Thin  Diet effective now       Question Answer Comment  Room service appropriate? Yes   Fluid consistency: Thin  10/04/22 1219            Subjective: Patient denying any new complaints besides mild bilateral lower extremity neuropathic pain.  She also reports to me that she has noticed some firmness in her left breast.  Recently outpatient treated for possible mastitis with doxycycline leading to her nausea vomiting symptoms.  She declines having mammogram in the past.   Examination: Exam performed with patient's RN as chaperone, Almira Bar General exam: Appears calm and comfortable   Respiratory system: Clear to auscultation. Respiratory effort normal. Cardiovascular system: S1 & S2 heard, RRR. No JVD, murmurs, rubs, gallops or clicks. No pedal edema. Gastrointestinal system: Abdomen is nondistended, soft and nontender. No organomegaly or masses felt. Normal bowel sounds heard. Central nervous system: Alert and oriented. No focal neurological deficits. Extremities: Symmetric 5 x 5 power. Skin: Left breast mild firmness around her nipple area.  Right breast appears to be soft throughout. Psychiatry: Judgement and insight appear normal. Mood & affect appropriate.  Objective: Vitals:   10/05/22 0517 10/05/22 1223 10/05/22 1947 10/06/22 0522  BP: 115/65 (!) 161/101 116/65 (!) 142/64  Pulse: 72 86 84 82  Resp: 18 19 18 18   Temp: 97.8 F (36.6 C) 97.6 F (36.4 C) 97.9 F (36.6 C) 98.3 F (36.8 C)  TempSrc:  Oral  Oral  SpO2: 99% 100% 100% 94%  Weight:      Height:        Intake/Output Summary (Last 24 hours) at 10/06/2022 1025 Last data filed at 10/06/2022 1610 Gross per 24 hour  Intake 983 ml  Output 100 ml  Net 883 ml   Filed Weights   10/02/22 1453  Weight: 128.4 kg    Scheduled Meds:  atorvastatin  10 mg Oral QHS   cholecalciferol  1,000 Units Oral Daily   enoxaparin (LOVENOX) injection  60 mg Subcutaneous QHS   escitalopram  10 mg Oral q morning   hydrOXYzine  25 mg Oral BID   insulin aspart  0-20 Units Subcutaneous TID PC & HS   loratadine  10 mg Oral Daily   mirabegron ER  25 mg Oral Daily   pantoprazole  40 mg Oral QAC breakfast   potassium chloride SA  20 mEq Oral Daily   pregabalin  75 mg Oral BID   Continuous Infusions:  sodium chloride 75 mL/hr at 10/06/22 0924   promethazine (PHENERGAN) injection (IM or IVPB) 12.5 mg (10/03/22 1735)    Nutritional status     Body mass index is 51.76 kg/m.  Data Reviewed:   CBC: Recent Labs  Lab 10/02/22 1500 10/03/22 0536 10/04/22 0535 10/05/22 0518 10/06/22 0504  WBC 8.9 7.1 5.8 5.8  5.9  NEUTROABS 6.8  --   --   --   --   HGB 16.2* 14.4 14.3 13.0 13.6  HCT 56.9* 50.4* 51.0* 46.9* 49.2*  MCV 84.3 84.6 85.6 86.5 87.7  PLT 441* 405* 374 359 339   Basic Metabolic Panel: Recent Labs  Lab 10/02/22 1500 10/03/22 0536 10/04/22 0535 10/05/22 0518 10/06/22 0504  NA 144 144 144 138 139  K 4.3 3.8 4.2 4.2 4.5  CL 103 106 106 105 108  CO2 30 28 28 25 24   GLUCOSE 135* 129* 106* 115* 129*  BUN 26* 25* 23 26* 28*  CREATININE 1.57* 1.52* 1.58* 1.98* 1.98*  CALCIUM 9.0 8.3* 9.0 8.5* 8.6*  MG  --   --  1.5* 2.1 2.1  PHOS  --   --   --  3.6  --    GFR: Estimated Creatinine Clearance: 34.5 mL/min (A) (by C-G formula based on SCr of 1.98 mg/dL (H)). Liver Function Tests: Recent Labs  Lab 10/02/22 1500 10/04/22 0535 10/05/22 0518 10/06/22 0504  AST 17 12* 14* 18  ALT 16 14 15 21   ALKPHOS 161* 125 107 123  BILITOT 0.7 0.6 0.9 0.6  PROT 8.5* 6.8 6.1* 6.4*  ALBUMIN 3.4* 2.9* 2.7* 2.8*   Recent Labs  Lab 10/02/22 1500  LIPASE 56*   No results for input(s): "AMMONIA" in the last 168 hours. Coagulation Profile: No results for input(s): "INR", "PROTIME" in the last 168 hours. Cardiac Enzymes: No results for input(s): "CKTOTAL", "CKMB", "CKMBINDEX", "TROPONINI" in the last 168 hours. BNP (last 3 results) No results for input(s): "PROBNP" in the last 8760 hours. HbA1C: No results for input(s): "HGBA1C" in the last 72 hours. CBG: Recent Labs  Lab 10/05/22 0820 10/05/22 1218 10/05/22 1631 10/05/22 1949 10/06/22 0747  GLUCAP 107* 133* 163* 137* 132*   Lipid Profile: No results for input(s): "CHOL", "HDL", "LDLCALC", "TRIG", "CHOLHDL", "LDLDIRECT" in the last 72 hours. Thyroid Function Tests: Recent Labs    10/03/22 1245  TSH 3.471   Anemia Panel: No results for input(s): "VITAMINB12", "FOLATE", "FERRITIN", "TIBC", "IRON", "RETICCTPCT" in the last 72 hours. Sepsis Labs: No results for input(s): "PROCALCITON", "LATICACIDVEN" in the last 168  hours.  Recent Results (from the past 240 hour(s))  SARS Coronavirus 2 by RT PCR (hospital order, performed in [facility] hospital lab) *cepheid single result test* Anterior Nasal Swab     Status: None   Collection Time: 10/02/22 10:52 PM   Specimen: Anterior Nasal Swab  Result Value Ref Range Status   SARS Coronavirus 2 by RT PCR NEGATIVE NEGATIVE Final    Comment: (NOTE) SARS-CoV-2 target nucleic acids are NOT DETECTED.  The SARS-CoV-2 RNA is generally detectable in upper and lower respiratory specimens during the acute phase of infection. The lowest concentration of SARS-CoV-2 viral copies this assay can detect is 250 copies / mL. A negative result does not preclude SARS-CoV-2 infection and should not be used as the sole basis for treatment or other patient management decisions.  A negative result may occur with improper specimen collection / handling, submission of specimen other than nasopharyngeal swab, presence of viral mutation(s) within the areas targeted by this assay, and inadequate number of viral copies (<250 copies / mL). A negative result must be combined with clinical observations, patient history, and epidemiological information.  Fact Sheet for Patients:   RoadLapTop.co.za  Fact Sheet for Healthcare Providers: http://kim-miller.com/  This test is not yet approved or  cleared by the [place] FDA and has been authorized for detection and/or diagnosis of SARS-CoV-2 by FDA under an Emergency Use Authorization (EUA).  This EUA will remain in effect (meaning this test can be used) for the duration of the COVID-19 declaration under Section 564(b)(1) of the Act, 21 U.S.C. section 360bbb-3(b)(1), unless the authorization is terminated or revoked sooner.  Performed at [facility], [address], [place], [place] [address]          Radiology Studies: No results found.         LOS: 3  days   Time spent= 35 mins     Joline Maxcy, MD [facility]  If 7PM-7AM, please contact night-coverage  10/06/2022, 10:25 AM

## 2022-10-07 DIAGNOSIS — R112 Nausea with vomiting, unspecified: Secondary | ICD-10-CM | POA: Diagnosis not present

## 2022-10-07 LAB — COMPREHENSIVE METABOLIC PANEL
ALT: 19 U/L (ref 0–44)
AST: 12 U/L — ABNORMAL LOW (ref 15–41)
Albumin: 2.7 g/dL — ABNORMAL LOW (ref 3.5–5.0)
Alkaline Phosphatase: 125 U/L (ref 38–126)
Anion gap: 8 (ref 5–15)
BUN: 26 mg/dL — ABNORMAL HIGH (ref 8–23)
CO2: 21 mmol/L — ABNORMAL LOW (ref 22–32)
Calcium: 8.8 mg/dL — ABNORMAL LOW (ref 8.9–10.3)
Chloride: 108 mmol/L (ref 98–111)
Creatinine, Ser: 1.63 mg/dL — ABNORMAL HIGH (ref 0.44–1.00)
GFR, Estimated: 34 mL/min — ABNORMAL LOW (ref >60)
Glucose, Bld: 129 mg/dL — ABNORMAL HIGH (ref 70–99)
Potassium: 4.6 mmol/L (ref 3.5–5.1)
Sodium: 137 mmol/L (ref 135–145)
Total Bilirubin: 0.4 mg/dL (ref 0.3–1.2)
Total Protein: 6.3 g/dL — ABNORMAL LOW (ref 6.5–8.1)

## 2022-10-07 LAB — CBC
HCT: 49.6 % — ABNORMAL HIGH (ref 36.0–46.0)
Hemoglobin: 13.3 g/dL (ref 12.0–15.0)
MCH: 23.5 pg — ABNORMAL LOW (ref 26.0–34.0)
MCHC: 26.8 g/dL — ABNORMAL LOW (ref 30.0–36.0)
MCV: 87.6 fL (ref 80.0–100.0)
Platelets: 311 10*3/uL (ref 150–400)
RBC: 5.66 MIL/uL — ABNORMAL HIGH (ref 3.87–5.11)
RDW: 19 % — ABNORMAL HIGH (ref 11.5–15.5)
WBC: 5.9 10*3/uL (ref 4.0–10.5)
nRBC: 0 % (ref 0.0–0.2)

## 2022-10-07 LAB — GLUCOSE, CAPILLARY
Glucose-Capillary: 130 mg/dL — ABNORMAL HIGH (ref 70–99)
Glucose-Capillary: 169 mg/dL — ABNORMAL HIGH (ref 70–99)

## 2022-10-07 LAB — MAGNESIUM: Magnesium: 1.9 mg/dL (ref 1.7–2.4)

## 2022-10-07 MED ORDER — GABAPENTIN 300 MG PO CAPS: 300.0000 mg | ORAL_CAPSULE | Freq: Three times a day (TID) | ORAL | 0 refills | Status: DC | PRN

## 2022-10-07 NOTE — Discharge Summary (Signed)
Physician Discharge Summary  [name] [name] PIR:[id number] DOB: 13-Mar-1955 DOA: 10/02/2022  PCP: [name]  Admit date: 10/02/2022 Discharge date: 10/07/2022  Admitted From: Home Disposition:  Home  Recommendations for Outpatient Follow-up:  Follow up with PCP in 1-2 weeks Please obtain BMP/CBC in one week your next doctors visit.  Gabapentin 3 mg every 8 hours as needed given for short time.  Needs to address her chronic neuropathy with PCP.   Discharge Condition: Stable CODE STATUS: Full code Diet recommendation: Diabetic/cardiac  Brief/Interim Summary: Brief Narrative:  67 year old with history of diastolic CHF, OSA on CPAP, chronic hypoxia on 4 L nasal cannula, insulin-dependent DM2, CKD stage IIIb, anxiety/depression comes to the hospital with nausea and vomiting.  CT abdomen pelvis did not show any acute pathology.  Initially reported of diarrhea but none since being in the hospital.  Recently completed a course for doxycycline.  Upon admission nausea vomiting and diarrhea improved.  No obvious evidence of loose stool therefore unable to run any stool studies.  Did have hospital stay complicated by AKI requiring IV fluids.  She also reported of persistent severe bilateral lower extremity neuropathic pain therefore Lyrica was resumed and as needed gabapentin was added. Over the course of 4 days in the hospital patient started feeling well, renal function has improved, denies any nausea and vomiting. Medically stable for discharge, friend also present at bedside during my instructions.     Assessment & Plan:  Principal Problem:   Intractable nausea and vomiting Active Problems:   (HFpEF) heart failure with preserved ejection fraction (HCC)   Type 2 diabetes mellitus with stage 3 chronic kidney disease (HCC)   Hyperlipidemia associated with type 2 diabetes mellitus (HCC)   OSA on CPAP   Morbid obesity with BMI of 50.0-59.9, adult (HCC)   Chronic kidney disease,  stage 3b (HCC)     Assessment and Plan: * Intractable nausea and vomiting -pt with intermittent and now worsening nausea, vomiting and diarrhea after completing a course of doxycyline which potentially is source of her symptoms. Although she does report use of marijuana. UDS is pending.  -CT A/P negative, CMP mostly unremarkable  -will test also for COVID -test for C.diff and stool study with her ongoing diarrhea -continue symptomatic management with PRN antiemetics -full liquid diet for bowel rest and can advance as tolerated  (HFpEF) heart failure with preserved ejection fraction (HCC) EF of 60-65% and grade 1 diastolic dysfunction   Chronic kidney disease, stage 3b (HCC) -creatinine of 1.57 with previous around 2-3   Morbid obesity with BMI of 50.0-59.9, adult (HCC) BMI of 51  OSA on CPAP -pt declines CPAP for tonight  Hyperlipidemia associated with type 2 diabetes mellitus (HCC) -continue statin  Type 2 diabetes mellitus with stage 3 chronic kidney disease (HCC) -A1C 10.3 in March -on 50 units Semglee at home -place on SSI for now since she has not be able to tolerate much oral intake       Body mass index is 51.76 kg/m.        Consultations: None  Subjective: Feeling well no complaints.  Wishes to go home.  Patient's family friend is also present at bedside.  All the instructions given by me and patient understands that.  Discharge Exam: Vitals:   10/06/22 1937 10/07/22 0447  BP: (!) 126/58 (!) 116/57  Pulse: 88 77  Resp:    Temp: 97.9 F (36.6 C) 98.2 F (36.8 C)  SpO2: 97% 97%   Vitals:  10/06/22 0522 10/06/22 1341 10/06/22 1937 10/07/22 0447  BP: (!) 142/64 120/63 (!) 126/58 (!) 116/57  Pulse: 82 81 88 77  Resp: 18 16    Temp: 98.3 F (36.8 C) 97.8 F (36.6 C) 97.9 F (36.6 C) 98.2 F (36.8 C)  TempSrc: Oral Oral Oral Oral  SpO2: 94% 100% 97% 97%  Weight:      Height:        General: Pt is alert, awake, not in acute  distress Cardiovascular: RRR, S1/S2 +, no rubs, no gallops Respiratory: CTA bilaterally, no wheezing, no rhonchi Abdominal: Soft, NT, ND, bowel sounds + Extremities: no edema, no cyanosis  Discharge Instructions   Allergies as of 10/07/2022       Reactions   Lisinopril Swelling   Morphine Itching, Rash   Penicillins Hives, Itching, Nausea And Vomiting, Rash   Has patient had a PCN reaction causing immediate rash, facial/tongue/throat swelling, SOB or lightheadedness with hypotension: No Has patient had a PCN reaction causing severe rash involving mucus membranes or skin necrosis: No Has patient had a PCN reaction that required hospitalization: No Has patient had a PCN reaction occurring within the last 10 years: No If all of the above answers are "NO", then may proceed with Cephalosporin use.        Medication List     TAKE these medications    acetaminophen 500 MG tablet Commonly known as: TYLENOL Take 500 mg by mouth every 6 (six) hours as needed.   albuterol 108 (90 Base) MCG/ACT inhaler Commonly known as: VENTOLIN HFA Inhale 2 puffs into the lungs every 6 (six) hours as needed for wheezing or shortness of breath.   atorvastatin 10 MG tablet Commonly known as: LIPITOR Take 10 mg by mouth at bedtime.   cetirizine 10 MG tablet Commonly known as: ZYRTEC Take 10 mg by mouth daily.   cholecalciferol 25 MCG (1000 UNIT) tablet Commonly known as: VITAMIN D3 Take 1,000 Units by mouth daily.   escitalopram 10 MG tablet Commonly known as: LEXAPRO Take 1 tablet (10 mg total) by mouth every morning.   gabapentin 300 MG capsule Commonly known as: NEURONTIN Take 1 capsule (300 mg total) by mouth every 8 (eight) hours as needed (Neuropathy).   hydrOXYzine 25 MG capsule Commonly known as: VISTARIL Take 25 mg by mouth 2 (two) times daily.   insulin glargine-yfgn 100 UNIT/ML Pen Commonly known as: SEMGLEE Inject 60 Units into the skin 2 (two) times daily.  52 units in  the morning and 7 units at bedtime What changed:  how much to take when to take this additional instructions   mirabegron ER 25 MG Tb24 tablet Commonly known as: MYRBETRIQ Take 25 mg by mouth daily.   mirtazapine 15 MG tablet Commonly known as: REMERON Take 15 mg by mouth at bedtime as needed (sleep).   Nyamyc powder Generic drug: nystatin Apply to affected area 2 (two) times daily to groin and areas of fungal infection   pantoprazole 40 MG tablet Commonly known as: PROTONIX Take 40 mg by mouth daily before breakfast.   potassium chloride SA 20 MEQ tablet Commonly known as: KLOR-CON M Take 1 tablet (20 mEq total) by mouth daily.   pregabalin 75 MG capsule Commonly known as: LYRICA Take 75 mg by mouth 2 (two) times daily.   Semaglutide(0.25 or 0.5MG /DOS) 2 MG/1.5ML Sopn Inject 2 mg into the skin every Thursday.   Unifine Pentips 31G X 5 MM Misc Generic drug: Insulin Pen Needle Use as directed  daily        Follow-up Information     [facility] Follow up.   Why: [facility] will follow up with you at discharge to provide home health services        [facility] in 1 week(s).   Contact information: [address] [facility] [place] [address] [phone]                Allergies  Allergen Reactions   Lisinopril Swelling   Morphine Itching and Rash   Penicillins Hives, Itching, Nausea And Vomiting and Rash    Has patient had a PCN reaction causing immediate rash, facial/tongue/throat swelling, SOB or lightheadedness with hypotension: No Has patient had a PCN reaction causing severe rash involving mucus membranes or skin necrosis: No Has patient had a PCN reaction that required hospitalization: No Has patient had a PCN reaction occurring within the last 10 years: No If all of the above answers are "NO", then may proceed with Cephalosporin use.     You were cared for by a hospitalist during your hospital stay.  If you have any questions about your discharge medications or the care you received while you were in the hospital after you are discharged, you can call the unit and asked to speak with the hospitalist on call if the hospitalist that took care of you is not available. Once you are discharged, your primary care physician will handle any further medical issues. Please note that no refills for any discharge medications will be authorized once you are discharged, as it is imperative that you return to your primary care physician (or establish a relationship with a primary care physician if you do not have one) for your aftercare needs so that they can reassess your need for medications and monitor your lab values.  You were cared for by a hospitalist during your hospital stay. If you have any questions about your discharge medications or the care you received while you were in the hospital after you are discharged, you can call the unit and asked to speak with the hospitalist on call if the hospitalist that took care of you is not available. Once you are discharged, your primary care physician will handle any further medical issues. Please note that NO REFILLS for any discharge medications will be authorized once you are discharged, as it is imperative that you return to your primary care physician (or establish a relationship with a primary care physician if you do not have one) for your aftercare needs so that they can reassess your need for medications and monitor your lab values.  Please request your Prim.MD to go over all Hospital Tests and Procedure/Radiological results at the follow up, please get all Hospital records sent to your Prim MD by signing hospital release before you go home.  Get CBC, CMP, 2 view Chest X ray checked  by Primary MD during your next visit or SNF MD in 5-7 days ( we routinely change or add medications that can affect your baseline labs and fluid status, therefore we recommend that  you get the mentioned basic workup next visit with your PCP, your PCP may decide not to get them or add new tests based on their clinical decision)  On your next visit with your primary care physician please Get Medicines reviewed and adjusted.  If you experience worsening of your admission symptoms, develop shortness of breath, life threatening emergency, suicidal or homicidal thoughts you must seek medical attention  immediately by calling 911 or calling your MD immediately  if symptoms less severe.  You Must read complete instructions/literature along with all the possible adverse reactions/side effects for all the Medicines you take and that have been prescribed to you. Take any new Medicines after you have completely understood and accpet all the possible adverse reactions/side effects.   Do not drive, operate heavy machinery, perform activities at heights, swimming or participation in water activities or provide baby sitting services if your were admitted for syncope or siezures until you have seen by Primary MD or a Neurologist and advised to do so again.  Do not drive when taking Pain medications.   Procedures/Studies: CT CHEST ABDOMEN PELVIS WO CONTRAST  Result Date: 10/02/2022 CLINICAL DATA:  Sepsis EXAM: CT CHEST, ABDOMEN AND PELVIS WITHOUT CONTRAST TECHNIQUE: Multidetector CT imaging of the chest, abdomen and pelvis was performed following the standard protocol without IV contrast. RADIATION DOSE REDUCTION: This exam was performed according to the departmental dose-optimization program which includes automated exposure control, adjustment of the mA and/or kV according to patient size and/or use of iterative reconstruction technique. COMPARISON:  09/04/2021, 06/13/2021 FINDINGS: CT CHEST FINDINGS Cardiovascular: Cardiomegaly. No pericardial effusion. Thoracic aorta is nonaneurysmal. Atherosclerotic calcification of the aorta and coronary arteries. Main pulmonary artery measures 3.2 cm in  diameter. Mediastinum/Nodes: No axillary, mediastinal, or hilar lymphadenopathy. Thyroid gland is enlarged and heterogeneous. Trachea and esophagus demonstrate no significant abnormality. Lungs/Pleura: Mild dependent subsegmental atelectasis. Lungs are otherwise clear. No pleural effusion or pneumothorax. Musculoskeletal: No chest wall mass or suspicious bone lesions identified. Advanced degenerative changes of the right glenohumeral joint. CT ABDOMEN PELVIS FINDINGS Hepatobiliary: No focal liver abnormality is seen. No gallstones, gallbladder wall thickening, or biliary dilatation. Pancreas: Unremarkable. No pancreatic ductal dilatation or surrounding inflammatory changes. Spleen: Normal in size without focal abnormality. Adrenals/Urinary Tract: Adrenal glands are unremarkable. Kidneys are normal, without renal calculi, focal lesion, or hydronephrosis. Bladder is unremarkable. Stomach/Bowel: Stomach is within normal limits. Scattered colonic diverticulosis. No evidence of bowel wall thickening, distention, or inflammatory changes. Surgical clip in the proximal duodenum is unchanged. Vascular/Lymphatic: Aortic atherosclerosis. No enlarged abdominal or pelvic lymph nodes. Reproductive: Status post hysterectomy. No adnexal masses. Other: No free fluid. No abdominopelvic fluid collection. No pneumoperitoneum. No abdominal wall hernia. Musculoskeletal: No acute or significant osseous findings. Chronic degenerative changes at L4-5 where there is unchanged degree of anterolisthesis. IMPRESSION: 1. No acute findings within the chest, abdomen, or pelvis. 2. Cardiomegaly. 3. Colonic diverticulosis without evidence of acute diverticulitis. 4. Aortic and coronary artery atherosclerosis (ICD10-I70.0). 5. Enlarged and heterogeneous thyroid gland. Recommend nonemergent thyroid ultrasound (ref: J Am Coll Radiol. 2015 Feb;12(2): 143-50). Electronically Signed   By: Duanne Guess D.O.   On: 10/02/2022 18:22     The results of  significant diagnostics from this hospitalization (including imaging, microbiology, ancillary and laboratory) are listed below for reference.     Microbiology: Recent Results (from the past 240 hour(s))  SARS Coronavirus 2 by RT PCR (hospital order, performed in [facility] hospital lab) *cepheid single result test* Anterior Nasal Swab     Status: None   Collection Time: 10/02/22 10:52 PM   Specimen: Anterior Nasal Swab  Result Value Ref Range Status   SARS Coronavirus 2 by RT PCR NEGATIVE NEGATIVE Final    Comment: (NOTE) SARS-CoV-2 target nucleic acids are NOT DETECTED.  The SARS-CoV-2 RNA is generally detectable in upper and lower respiratory specimens during the acute phase of infection. The lowest concentration of SARS-CoV-2 viral  copies this assay can detect is 250 copies / mL. A negative result does not preclude SARS-CoV-2 infection and should not be used as the sole basis for treatment or other patient management decisions.  A negative result may occur with improper specimen collection / handling, submission of specimen other than nasopharyngeal swab, presence of viral mutation(s) within the areas targeted by this assay, and inadequate number of viral copies (<250 copies / mL). A negative result must be combined with clinical observations, patient history, and epidemiological information.  Fact Sheet for Patients:   RoadLapTop.co.za  Fact Sheet for Healthcare Providers: http://kim-miller.com/  This test is not yet approved or  cleared by the [place] FDA and has been authorized for detection and/or diagnosis of SARS-CoV-2 by FDA under an Emergency Use Authorization (EUA).  This EUA will remain in effect (meaning this test can be used) for the duration of the COVID-19 declaration under Section 564(b)(1) of the Act, 21 U.S.C. section 360bbb-3(b)(1), unless the authorization is terminated or revoked sooner.  Performed at  [facility], [address] [place], [place] [address]      Labs: BNP (last 3 results) No results for input(s): "BNP" in the last 8760 hours. Basic Metabolic Panel: Recent Labs  Lab 10/03/22 0536 10/04/22 0535 10/05/22 0518 10/06/22 0504 10/07/22 0516  NA 144 144 138 139 137  K 3.8 4.2 4.2 4.5 4.6  CL 106 106 105 108 108  CO2 28 28 25 24  21*  GLUCOSE 129* 106* 115* 129* 129*  BUN 25* 23 26* 28* 26*  CREATININE 1.52* 1.58* 1.98* 1.98* 1.63*  CALCIUM 8.3* 9.0 8.5* 8.6* 8.8*  MG  --  1.5* 2.1 2.1 1.9  PHOS  --   --  3.6  --   --    Liver Function Tests: Recent Labs  Lab 10/02/22 1500 10/04/22 0535 10/05/22 0518 10/06/22 0504 10/07/22 0516  AST 17 12* 14* 18 12*  ALT 16 14 15 21 19   ALKPHOS 161* 125 107 123 125  BILITOT 0.7 0.6 0.9 0.6 0.4  PROT 8.5* 6.8 6.1* 6.4* 6.3*  ALBUMIN 3.4* 2.9* 2.7* 2.8* 2.7*   Recent Labs  Lab 10/02/22 1500  LIPASE 56*   No results for input(s): "AMMONIA" in the last 168 hours. CBC: Recent Labs  Lab 10/02/22 1500 10/03/22 0536 10/04/22 0535 10/05/22 0518 10/06/22 0504 10/07/22 0516  WBC 8.9 7.1 5.8 5.8 5.9 5.9  NEUTROABS 6.8  --   --   --   --   --   HGB 16.2* 14.4 14.3 13.0 13.6 13.3  HCT 56.9* 50.4* 51.0* 46.9* 49.2* 49.6*  MCV 84.3 84.6 85.6 86.5 87.7 87.6  PLT 441* 405* 374 359 339 311   Cardiac Enzymes: No results for input(s): "CKTOTAL", "CKMB", "CKMBINDEX", "TROPONINI" in the last 168 hours. BNP: Invalid input(s): "POCBNP" CBG: Recent Labs  Lab 10/06/22 1156 10/06/22 1630 10/06/22 1940 10/07/22 0739 10/07/22 1201  GLUCAP 177* 151* 219* 130* 169*   D-Dimer No results for input(s): "DDIMER" in the last 72 hours. Hgb A1c No results for input(s): "HGBA1C" in the last 72 hours. Lipid Profile No results for input(s): "CHOL", "HDL", "LDLCALC", "TRIG", "CHOLHDL", "LDLDIRECT" in the last 72 hours. Thyroid function studies No results for input(s): "TSH", "T4TOTAL", "T3FREE", "THYROIDAB" in  the last 72 hours.  Invalid input(s): "FREET3" Anemia work up No results for input(s): "VITAMINB12", "FOLATE", "FERRITIN", "TIBC", "IRON", "RETICCTPCT" in the last 72 hours. Urinalysis    Component Value Date/Time   COLORURINE YELLOW  10/03/2022 0521   APPEARANCEUR CLEAR 10/03/2022 0521   LABSPEC 1.016 10/03/2022 0521   PHURINE 6.0 10/03/2022 0521   GLUCOSEU NEGATIVE 10/03/2022 0521   HGBUR NEGATIVE 10/03/2022 0521   BILIRUBINUR NEGATIVE 10/03/2022 0521   KETONESUR NEGATIVE 10/03/2022 0521   PROTEINUR >=300 (A) 10/03/2022 0521   UROBILINOGEN 0.2 06/28/2014 1500   NITRITE NEGATIVE 10/03/2022 0521   LEUKOCYTESUR NEGATIVE 10/03/2022 0521   Sepsis Labs Recent Labs  Lab 10/04/22 0535 10/05/22 0518 10/06/22 0504 10/07/22 0516  WBC 5.8 5.8 5.9 5.9   Microbiology Recent Results (from the past 240 hour(s))  SARS Coronavirus 2 by RT PCR (hospital order, performed in [facility] hospital lab) *cepheid single result test* Anterior Nasal Swab     Status: None   Collection Time: 10/02/22 10:52 PM   Specimen: Anterior Nasal Swab  Result Value Ref Range Status   SARS Coronavirus 2 by RT PCR NEGATIVE NEGATIVE Final    Comment: (NOTE) SARS-CoV-2 target nucleic acids are NOT DETECTED.  The SARS-CoV-2 RNA is generally detectable in upper and lower respiratory specimens during the acute phase of infection. The lowest concentration of SARS-CoV-2 viral copies this assay can detect is 250 copies / mL. A negative result does not preclude SARS-CoV-2 infection and should not be used as the sole basis for treatment or other patient management decisions.  A negative result may occur with improper specimen collection / handling, submission of specimen other than nasopharyngeal swab, presence of viral mutation(s) within the areas targeted by this assay, and inadequate number of viral copies (<250 copies / mL). A negative result must be combined with clinical observations, patient history, and  epidemiological information.  Fact Sheet for Patients:   RoadLapTop.co.za  Fact Sheet for Healthcare Providers: http://kim-miller.com/  This test is not yet approved or  cleared by the [place] FDA and has been authorized for detection and/or diagnosis of SARS-CoV-2 by FDA under an Emergency Use Authorization (EUA).  This EUA will remain in effect (meaning this test can be used) for the duration of the COVID-19 declaration under Section 564(b)(1) of the Act, 21 U.S.C. section 360bbb-3(b)(1), unless the authorization is terminated or revoked sooner.  Performed at [facility], [address] [place], [place] [address]      Time coordinating discharge:  I have spent 35 minutes face to face with the patient and on the ward discussing the patients care, assessment, plan and disposition with other care givers. >50% of the time was devoted counseling the patient about the risks and benefits of treatment/Discharge disposition and coordinating care.   SIGNED:   Miguel Rota, MD  [facility] 10/07/2022, 1:06 PM   If 7PM-7AM, please contact night-coverage

## 2022-10-07 NOTE — TOC Transition Note (Signed)
Transition of Care [facility]) - CM/SW Discharge Note   Patient Details  Name: [name] [name] MRN: [id number] Date of Birth: 1955/10/23  [facility] of [facility]) CM/SW Contact:  Otelia Santee, LCSW Phone Number: 10/07/2022, 11:37 AM   Clinical Narrative:    Pt to return home with home health services through [place]. Pt requiring PTAR for transportation home. PTAR called at 11:35am for transportation.    Final next level of care: Home w Home Health Services Barriers to Discharge: No Barriers Identified   Patient Goals and CMS Choice CMS Medicare.gov Compare Post Acute Care list provided to:: Patient Choice offered to / list presented to : Patient  Discharge Placement                         Discharge Plan and Services Additional resources added to the After Visit Summary for   In-house Referral: Clinical Social Work Discharge Planning Services: NA Post Acute Care Choice: Home Health, Durable Medical Equipment          DME Arranged: N/A DME Agency: NA       HH Arranged: PT, OT HH Agency: [facility] Date [facility] Agency Contacted: 10/06/22 Time HH Agency Contacted: 1428 Representative spoke with at [facility] Agency: Angie  Social Determinants of Health (SDOH) Interventions SDOH Screenings   Food Insecurity: No Food Insecurity (10/02/2022)  Housing: Low Risk  (10/02/2022)  Transportation Needs: No Transportation Needs (10/02/2022)  Utilities: Not At Risk (10/02/2022)  Financial Resource Strain: Patient Declined (05/16/2022)   Received from [facility]  Social Connections: Unknown (05/13/2022)   Received from [facility]  Stress: No Stress Concern Present (05/16/2022)   Received from [facility]  Tobacco Use: Medium Risk (10/02/2022)     Readmission Risk Interventions    10/07/2022   11:37 AM 09/28/2021   11:23 AM 09/08/2021   10:20 AM  Readmission Risk Prevention Plan  Transportation Screening Complete Complete Complete  PCP or Specialist Appt within  3-5 Days Complete --   Home Care Screening   Complete  Medication Review (RN CM)   Complete  HRI or Home Care Consult Complete Complete   Social Work Consult for Recovery Care Planning/Counseling Complete Complete   Palliative Care Screening Not Applicable Not Applicable   Medication Review Oceanographer) Complete Complete

## 2022-10-07 NOTE — Progress Notes (Signed)
PT Cancellation Note  Patient Details Name: [name] [name] MRN: [id number] DOB: 01-19-1956   Cancelled Treatment:    Reason Eval/Treat Not Completed: PT screened, no needs identified, will sign off (pt has been bedbound for 4 years, she has a hoyer at home which her aides use for transfers. Pt is at baseline. PT signing off.)   Tamala Ser PT 10/07/2022  Acute Rehabilitation Services  Office [phone]

## 2022-10-07 NOTE — Plan of Care (Signed)
  Problem: Health Behavior/Discharge Planning: Goal: Ability to manage health-related needs will improve Outcome: Progressing   Problem: Clinical Measurements: Goal: Will remain free from infection Outcome: Progressing   Problem: Safety: Goal: Ability to remain free from injury will improve Outcome: Progressing   Problem: Skin Integrity: Goal: Risk for impaired skin integrity will decrease Outcome: Progressing   

## 2023-01-13 ENCOUNTER — Emergency Department (HOSPITAL_COMMUNITY): Payer: No Typology Code available for payment source

## 2023-01-13 ENCOUNTER — Inpatient Hospital Stay (HOSPITAL_COMMUNITY): Payer: No Typology Code available for payment source

## 2023-01-13 ENCOUNTER — Inpatient Hospital Stay (HOSPITAL_COMMUNITY)
Admission: EM | Admit: 2023-01-13 | Discharge: 2023-01-17 | DRG: 291 | Disposition: A | Discharge status: Home Health | Payer: No Typology Code available for payment source | Attending: Internal Medicine | Admitting: Internal Medicine

## 2023-01-13 DIAGNOSIS — E1165 Type 2 diabetes mellitus with hyperglycemia: Secondary | ICD-10-CM | POA: Diagnosis present

## 2023-01-13 DIAGNOSIS — Z6841 Body Mass Index (BMI) 40.0 and over, adult: Secondary | ICD-10-CM

## 2023-01-13 DIAGNOSIS — Z833 Family history of diabetes mellitus: Secondary | ICD-10-CM

## 2023-01-13 DIAGNOSIS — Z7985 Long-term (current) use of injectable non-insulin antidiabetic drugs: Secondary | ICD-10-CM

## 2023-01-13 DIAGNOSIS — G4733 Obstructive sleep apnea (adult) (pediatric): Secondary | ICD-10-CM

## 2023-01-13 DIAGNOSIS — J9611 Chronic respiratory failure with hypoxia: Secondary | ICD-10-CM | POA: Diagnosis not present

## 2023-01-13 DIAGNOSIS — J9621 Acute and chronic respiratory failure with hypoxia: Secondary | ICD-10-CM | POA: Diagnosis present

## 2023-01-13 DIAGNOSIS — Z993 Dependence on wheelchair: Secondary | ICD-10-CM | POA: Diagnosis not present

## 2023-01-13 DIAGNOSIS — E1122 Type 2 diabetes mellitus with diabetic chronic kidney disease: Secondary | ICD-10-CM | POA: Diagnosis present

## 2023-01-13 DIAGNOSIS — Z9981 Dependence on supplemental oxygen: Secondary | ICD-10-CM | POA: Diagnosis not present

## 2023-01-13 DIAGNOSIS — N189 Chronic kidney disease, unspecified: Secondary | ICD-10-CM | POA: Diagnosis not present

## 2023-01-13 DIAGNOSIS — Z794 Long term (current) use of insulin: Secondary | ICD-10-CM | POA: Diagnosis not present

## 2023-01-13 DIAGNOSIS — T501X5A Adverse effect of loop [high-ceiling] diuretics, initial encounter: Secondary | ICD-10-CM | POA: Diagnosis not present

## 2023-01-13 DIAGNOSIS — N179 Acute kidney failure, unspecified: Secondary | ICD-10-CM | POA: Insufficient documentation

## 2023-01-13 DIAGNOSIS — Z885 Allergy status to narcotic agent status: Secondary | ICD-10-CM

## 2023-01-13 DIAGNOSIS — M6289 Other specified disorders of muscle: Secondary | ICD-10-CM | POA: Diagnosis present

## 2023-01-13 DIAGNOSIS — Z7401 Bed confinement status: Secondary | ICD-10-CM | POA: Diagnosis not present

## 2023-01-13 DIAGNOSIS — R532 Functional quadriplegia: Secondary | ICD-10-CM | POA: Diagnosis present

## 2023-01-13 DIAGNOSIS — G8929 Other chronic pain: Secondary | ICD-10-CM | POA: Diagnosis present

## 2023-01-13 DIAGNOSIS — G9349 Other encephalopathy: Secondary | ICD-10-CM | POA: Diagnosis present

## 2023-01-13 DIAGNOSIS — E119 Type 2 diabetes mellitus without complications: Secondary | ICD-10-CM

## 2023-01-13 DIAGNOSIS — I130 Hypertensive heart and chronic kidney disease with heart failure and stage 1 through stage 4 chronic kidney disease, or unspecified chronic kidney disease: Principal | ICD-10-CM | POA: Diagnosis present

## 2023-01-13 DIAGNOSIS — Z23 Encounter for immunization: Secondary | ICD-10-CM

## 2023-01-13 DIAGNOSIS — E662 Morbid (severe) obesity with alveolar hypoventilation: Secondary | ICD-10-CM | POA: Diagnosis present

## 2023-01-13 DIAGNOSIS — I959 Hypotension, unspecified: Secondary | ICD-10-CM | POA: Diagnosis not present

## 2023-01-13 DIAGNOSIS — Z87891 Personal history of nicotine dependence: Secondary | ICD-10-CM | POA: Diagnosis not present

## 2023-01-13 DIAGNOSIS — Z880 Allergy status to penicillin: Secondary | ICD-10-CM

## 2023-01-13 DIAGNOSIS — I5033 Acute on chronic diastolic (congestive) heart failure: Secondary | ICD-10-CM | POA: Diagnosis present

## 2023-01-13 DIAGNOSIS — G9340 Encephalopathy, unspecified: Secondary | ICD-10-CM | POA: Diagnosis not present

## 2023-01-13 DIAGNOSIS — E785 Hyperlipidemia, unspecified: Secondary | ICD-10-CM | POA: Diagnosis present

## 2023-01-13 DIAGNOSIS — J9622 Acute and chronic respiratory failure with hypercapnia: Secondary | ICD-10-CM | POA: Diagnosis present

## 2023-01-13 DIAGNOSIS — N1832 Chronic kidney disease, stage 3b: Secondary | ICD-10-CM | POA: Diagnosis present

## 2023-01-13 DIAGNOSIS — Z79899 Other long term (current) drug therapy: Secondary | ICD-10-CM | POA: Diagnosis not present

## 2023-01-13 DIAGNOSIS — I5032 Chronic diastolic (congestive) heart failure: Secondary | ICD-10-CM | POA: Diagnosis not present

## 2023-01-13 DIAGNOSIS — Z888 Allergy status to other drugs, medicaments and biological substances status: Secondary | ICD-10-CM | POA: Diagnosis not present

## 2023-01-13 DIAGNOSIS — Z91199 Patient's noncompliance with other medical treatment and regimen due to unspecified reason: Secondary | ICD-10-CM

## 2023-01-13 DIAGNOSIS — E1140 Type 2 diabetes mellitus with diabetic neuropathy, unspecified: Secondary | ICD-10-CM | POA: Diagnosis present

## 2023-01-13 DIAGNOSIS — R0602 Shortness of breath: Secondary | ICD-10-CM | POA: Diagnosis present

## 2023-01-13 DIAGNOSIS — M25569 Pain in unspecified knee: Secondary | ICD-10-CM | POA: Diagnosis present

## 2023-01-13 DIAGNOSIS — J9602 Acute respiratory failure with hypercapnia: Secondary | ICD-10-CM | POA: Diagnosis not present

## 2023-01-13 LAB — CBC WITH DIFFERENTIAL/PLATELET
Abs Immature Granulocytes: 0 10*3/uL (ref 0.00–0.07)
Basophils Absolute: 0.1 10*3/uL (ref 0.0–0.1)
Basophils Relative: 1 %
Eosinophils Absolute: 0.4 10*3/uL (ref 0.0–0.5)
Eosinophils Relative: 7 %
HCT: 53.6 % — ABNORMAL HIGH (ref 36.0–46.0)
Hemoglobin: 14.8 g/dL (ref 12.0–15.0)
Lymphocytes Relative: 32 %
Lymphs Abs: 1.8 10*3/uL (ref 0.7–4.0)
MCH: 22.8 pg — ABNORMAL LOW (ref 26.0–34.0)
MCHC: 27.6 g/dL — ABNORMAL LOW (ref 30.0–36.0)
MCV: 82.5 fL (ref 80.0–100.0)
Monocytes Absolute: 0.3 10*3/uL (ref 0.1–1.0)
Monocytes Relative: 5 %
Neutro Abs: 3.1 10*3/uL (ref 1.7–7.7)
Neutrophils Relative %: 55 %
Platelets: 324 10*3/uL (ref 150–400)
RBC: 6.5 MIL/uL — ABNORMAL HIGH (ref 3.87–5.11)
RDW: 22.5 % — ABNORMAL HIGH (ref 11.5–15.5)
WBC: 5.7 10*3/uL (ref 4.0–10.5)
nRBC: 0 % (ref 0.0–0.2)
nRBC: 0 /100{WBCs}

## 2023-01-13 LAB — TROPONIN I (HIGH SENSITIVITY)
Troponin I (High Sensitivity): 9 ng/L (ref <18)
Troponin I (High Sensitivity): 9 ng/L (ref <18)

## 2023-01-13 LAB — COMPREHENSIVE METABOLIC PANEL
ALT: 11 U/L (ref 0–44)
AST: 13 U/L — ABNORMAL LOW (ref 15–41)
Albumin: 3.1 g/dL — ABNORMAL LOW (ref 3.5–5.0)
Alkaline Phosphatase: 147 U/L — ABNORMAL HIGH (ref 38–126)
Anion gap: 12 (ref 5–15)
BUN: 52 mg/dL — ABNORMAL HIGH (ref 8–23)
CO2: 27 mmol/L (ref 22–32)
Calcium: 9.3 mg/dL (ref 8.9–10.3)
Chloride: 100 mmol/L (ref 98–111)
Creatinine, Ser: 2.47 mg/dL — ABNORMAL HIGH (ref 0.44–1.00)
GFR, Estimated: 21 mL/min — ABNORMAL LOW (ref >60)
Glucose, Bld: 178 mg/dL — ABNORMAL HIGH (ref 70–99)
Potassium: 4.5 mmol/L (ref 3.5–5.1)
Sodium: 139 mmol/L (ref 135–145)
Total Bilirubin: 0.7 mg/dL (ref <1.2)
Total Protein: 8 g/dL (ref 6.5–8.1)

## 2023-01-13 LAB — RESP PANEL BY RT-PCR (RSV, FLU A&B, COVID)  RVPGX2
Influenza A by PCR: NEGATIVE
Influenza B by PCR: NEGATIVE
Resp Syncytial Virus by PCR: NEGATIVE
SARS Coronavirus 2 by RT PCR: NEGATIVE

## 2023-01-13 LAB — GLUCOSE, CAPILLARY: Glucose-Capillary: 177 mg/dL — ABNORMAL HIGH (ref 70–99)

## 2023-01-13 LAB — BRAIN NATRIURETIC PEPTIDE: B Natriuretic Peptide: 334.2 pg/mL — ABNORMAL HIGH (ref 0.0–100.0)

## 2023-01-13 MED ORDER — ACETAMINOPHEN 325 MG PO TABS: 650.0000 mg | ORAL_TABLET | Freq: Four times a day (QID) | ORAL | Status: DC | PRN

## 2023-01-13 MED ORDER — ONDANSETRON HCL 4 MG/2ML IJ SOLN: 4.0000 mg | Freq: Four times a day (QID) | INTRAMUSCULAR | Status: DC | PRN

## 2023-01-13 MED ORDER — HEPARIN SODIUM (PORCINE) 5000 UNIT/ML IJ SOLN
5000.0000 [IU] | Freq: Three times a day (TID) | INTRAMUSCULAR | Status: DC
  Administered 2023-01-13 – 2023-01-17 (×11): 5000 [IU] via SUBCUTANEOUS
  Filled 2023-01-13 (×11): qty 1

## 2023-01-13 MED ORDER — ATORVASTATIN CALCIUM 10 MG PO TABS
10.0000 mg | ORAL_TABLET | Freq: Every day | ORAL | Status: DC
  Administered 2023-01-13 – 2023-01-15 (×3): 10 mg via ORAL
  Filled 2023-01-13 (×4): qty 1

## 2023-01-13 MED ORDER — SENNOSIDES-DOCUSATE SODIUM 8.6-50 MG PO TABS: 1.0000 | ORAL_TABLET | Freq: Every evening | ORAL | Status: DC | PRN

## 2023-01-13 MED ORDER — ACETAMINOPHEN 650 MG RE SUPP: 650.0000 mg | Freq: Four times a day (QID) | RECTAL | Status: DC | PRN

## 2023-01-13 MED ORDER — INSULIN ASPART 100 UNIT/ML IJ SOLN
0.0000 [IU] | Freq: Every day | INTRAMUSCULAR | Status: DC
  Administered 2023-01-14: 2 [IU] via SUBCUTANEOUS

## 2023-01-13 MED ORDER — INSULIN GLARGINE-YFGN 100 UNIT/ML ~~LOC~~ SOLN
20.0000 [IU] | Freq: Every day | SUBCUTANEOUS | Status: DC
  Administered 2023-01-13 – 2023-01-14 (×2): 20 [IU] via SUBCUTANEOUS
  Filled 2023-01-13 (×4): qty 0.2

## 2023-01-13 MED ORDER — INSULIN ASPART 100 UNIT/ML IJ SOLN
0.0000 [IU] | Freq: Three times a day (TID) | INTRAMUSCULAR | Status: DC
  Administered 2023-01-14: 1 [IU] via SUBCUTANEOUS
  Administered 2023-01-14 (×2): 2 [IU] via SUBCUTANEOUS
  Administered 2023-01-15: 3 [IU] via SUBCUTANEOUS
  Administered 2023-01-15: 2 [IU] via SUBCUTANEOUS
  Administered 2023-01-15: 1 [IU] via SUBCUTANEOUS
  Administered 2023-01-16 (×2): 3 [IU] via SUBCUTANEOUS
  Administered 2023-01-16: 1 [IU] via SUBCUTANEOUS
  Administered 2023-01-17: 3 [IU] via SUBCUTANEOUS
  Administered 2023-01-17: 1 [IU] via SUBCUTANEOUS

## 2023-01-13 MED ORDER — SODIUM CHLORIDE 0.9% FLUSH
3.0000 mL | Freq: Two times a day (BID) | INTRAVENOUS | Status: DC
  Administered 2023-01-14 – 2023-01-17 (×6): 3 mL via INTRAVENOUS

## 2023-01-13 MED ORDER — ONDANSETRON HCL 4 MG PO TABS: 4.0000 mg | ORAL_TABLET | Freq: Four times a day (QID) | ORAL | Status: DC | PRN

## 2023-01-13 MED ORDER — FUROSEMIDE 10 MG/ML IJ SOLN
40.0000 mg | Freq: Once | INTRAMUSCULAR | Status: AC
  Administered 2023-01-13: 40 mg via INTRAVENOUS
  Filled 2023-01-13: qty 4

## 2023-01-13 NOTE — ED Triage Notes (Signed)
Pt bib [facility] coming from home. Pt has had congestion/cold past few days, went to PCP and had CRX. Dr told her she has worsening heart and kidney functions and told her to come to ED. 75% SpO2 on room air. O2 PRN at home. 93% on 4L with EMS. Pt does has hx of CHF. Alert and oriented.   EMS vital signs:  Cbg 267 90 HR 138/92

## 2023-01-13 NOTE — ED Provider Notes (Signed)
[place] EMERGENCY DEPARTMENT AT [facility] Provider Note   CSN: [id number] Arrival date & time: 01/13/23  1744     History  Chief Complaint  Patient presents with   Shortness of Breath    [name] [name] is a 67 y.o. female.  HPI 67 year old female presents with abnormal blood work and abnormal chest x-ray.  She got blood work taken 2 days ago and a chest x-ray yesterday.  She is been having about a weeks worth of a cough and some congestion in her chest and nose.  She feels like yesterday she had a temp of 100.4.  EMS was called as her PCP told her to go to the ER and they found her to be hypoxic to 75% on room air.  She was up to the 90s on 4 L and now has been weaned back down to 2.  Patient states she always has some swelling in her legs but is worse over the past week.  She is been compliant with all of her meds including her torsemide.  She denies any chest pain or abdominal pain/swelling.  Home Medications Prior to Admission medications   Medication Sig Start Date End Date Taking? Authorizing Provider  albuterol (VENTOLIN HFA) 108 (90 Base) MCG/ACT inhaler Inhale 2 puffs into the lungs every 6 (six) hours as needed for wheezing or shortness of breath. 08/06/20  Yes Glade Lloyd, MD  acetaminophen (TYLENOL) 500 MG tablet Take 500 mg by mouth every 6 (six) hours as needed. 07/24/20   [provider]  atorvastatin (LIPITOR) 10 MG tablet Take 10 mg by mouth at bedtime.    [provider]  cetirizine (ZYRTEC) 10 MG tablet Take 10 mg by mouth daily. 11/20/20   [provider]  cholecalciferol (VITAMIN D3) 25 MCG (1000 UNIT) tablet Take 1,000 Units by mouth daily. 09/19/22   [provider]  escitalopram (LEXAPRO) 10 MG tablet Take 1 tablet (10 mg total) by mouth every morning. 08/01/21   Reed, Tiffany L, DO  gabapentin (NEURONTIN) 300 MG capsule Take 1 capsule (300 mg total) by mouth every 8 (eight) hours as needed (Neuropathy). Patient  not taking: Reported on 01/13/2023 10/07/22   Miguel Rota, MD  hydrOXYzine (VISTARIL) 25 MG capsule Take 25 mg by mouth 2 (two) times daily. 01/27/22   [provider]  insulin glargine-yfgn (SEMGLEE) 100 UNIT/ML Pen Inject 60 Units into the skin 2 (two) times daily.  52 units in the morning and 7 units at bedtime Patient taking differently: Inject 50 Units into the skin daily. 09/28/21   Hughie Closs, MD  Insulin Pen Needle 31G X 5 MM MISC Use as directed daily 02/03/21   Noralee Stain, DO  mirabegron ER (MYRBETRIQ) 25 MG TB24 tablet Take 25 mg by mouth daily. 06/29/22   [provider]  mirtazapine (REMERON) 15 MG tablet Take 15 mg by mouth at bedtime as needed (sleep). 11/06/20   [provider]  nystatin (MYCOSTATIN/NYSTOP) powder Apply to affected area 2 (two) times daily to groin and areas of fungal infection 02/03/21   Noralee Stain, DO  pantoprazole (PROTONIX) 40 MG tablet Take 40 mg by mouth daily before breakfast.    [provider]  potassium chloride SA (KLOR-CON M) 20 MEQ tablet Take 1 tablet (20 mEq total) by mouth daily. 09/09/21   Almon Hercules, MD  pregabalin (LYRICA) 75 MG capsule Take 75 mg by mouth 2 (two) times daily.    [provider]  Semaglutide,0.25 or 0.5MG /DOS, 2 MG/1.5ML SOPN Inject 2 mg into the skin every Thursday. 02/24/21   [provider]      Allergies    Lisinopril, Morphine, and Penicillins    Review of Systems   Review of Systems  Constitutional:  Positive for fever.  HENT:  Positive for congestion.   Respiratory:  Positive for cough and shortness of breath.   Cardiovascular:  Positive for leg swelling. Negative for chest pain.    Physical Exam Updated Vital Signs BP (!) 151/137   Pulse 86   Temp 99.1 F (37.3 C) (Oral)   Resp 15   SpO2 90%  Physical Exam Vitals and nursing note reviewed.  Constitutional:      General: She is not in acute distress.    Appearance: She is well-developed. She is  obese. She is not ill-appearing or diaphoretic.  HENT:     Head: Normocephalic and atraumatic.  Cardiovascular:     Rate and Rhythm: Normal rate and regular rhythm.     Heart sounds: Normal heart sounds.  Pulmonary:     Effort: Pulmonary effort is normal. No tachypnea, accessory muscle usage or respiratory distress.     Breath sounds: Wheezing and rales present.  Abdominal:     Palpations: Abdomen is soft.     Tenderness: There is no abdominal tenderness.  Musculoskeletal:     Right lower leg: Edema present.     Left lower leg: Edema present.     Comments: There is some pitting edema to bilateral ankles/lower legs  Skin:    General: Skin is warm and dry.  Neurological:     Mental Status: She is alert.     ED Results / Procedures / Treatments   Labs (all labs ordered are listed, but only abnormal results are displayed) Labs Reviewed  COMPREHENSIVE METABOLIC PANEL - Abnormal; Notable for the following components:      Result Value   Glucose, Bld 178 (*)    BUN 52 (*)    Creatinine, Ser 2.47 (*)    Albumin 3.1 (*)    AST 13 (*)    Alkaline Phosphatase 147 (*)    GFR, Estimated 21 (*)    All other components within normal limits  BRAIN NATRIURETIC PEPTIDE - Abnormal; Notable for the following components:   B Natriuretic Peptide 334.2 (*)    All other components within normal limits  CBC WITH DIFFERENTIAL/PLATELET - Abnormal; Notable for the following components:   RBC 6.50 (*)    HCT 53.6 (*)    MCH 22.8 (*)    MCHC 27.6 (*)    RDW 22.5 (*)    All other components within normal limits  RESP PANEL BY RT-PCR (RSV, FLU A&B, COVID)  RVPGX2  URINALYSIS, W/ REFLEX TO CULTURE (INFECTION SUSPECTED)  SODIUM, URINE, RANDOM  CREATININE, URINE, RANDOM  BASIC METABOLIC PANEL  CBC  TROPONIN I (HIGH SENSITIVITY)  TROPONIN I (HIGH SENSITIVITY)    EKG EKG Interpretation Date/Time:  Friday January 13 2023 18:08:53 EST Ventricular Rate:  88 PR Interval:  164 QRS  Duration:  104 QT Interval:  379 QTC Calculation: 459 R Axis:   135  Text Interpretation: Sinus rhythm Low voltage, precordial leads Probable right ventricular hypertrophy Borderline T abnormalities, anterior leads no significant change since Aug 2024 Confirmed by Pricilla Loveless [phone]) on 01/13/2023 7:15:38 PM  Radiology DG Chest Portable 1 View  Result Date: 01/13/2023 CLINICAL DATA:  Hypoxia. EXAM: PORTABLE CHEST 1 VIEW COMPARISON:  Chest radiograph  09/25/2021, CT 10/02/2022 FINDINGS: Chronic cardiomegaly, unchanged. Minor retrocardiac atelectasis at the left lung base. No confluent airspace disease, pleural effusion, or pneumothorax. No pulmonary edema. Chronic widening of the right acromioclavicular joint IMPRESSION: Chronic cardiomegaly. No acute chest findings. Electronically Signed   By: Narda Rutherford M.D.   On: 01/13/2023 18:50    Procedures Procedures    Medications Ordered in ED Medications  heparin injection 5,000 Units (has no administration in time range)  sodium chloride flush (NS) 0.9 % injection 3 mL (has no administration in time range)  acetaminophen (TYLENOL) tablet 650 mg (has no administration in time range)    Or  acetaminophen (TYLENOL) suppository 650 mg (has no administration in time range)  ondansetron (ZOFRAN) tablet 4 mg (has no administration in time range)    Or  ondansetron (ZOFRAN) injection 4 mg (has no administration in time range)  senna-docusate (Senokot-S) tablet 1 tablet (has no administration in time range)  insulin glargine-yfgn (SEMGLEE) injection 20 Units (has no administration in time range)  insulin aspart (novoLOG) injection 0-9 Units (has no administration in time range)  insulin aspart (novoLOG) injection 0-5 Units (has no administration in time range)  atorvastatin (LIPITOR) tablet 10 mg (has no administration in time range)    ED Course/ Medical Decision Making/ A&P                                 Medical Decision  Making Amount and/or Complexity of Data Reviewed Labs: ordered.    Details: Normal troponins.  Creatinine worse than baseline at 2.47.  BNP elevated Radiology: ordered and independent interpretation performed.    Details: No focal infiltrate ECG/medicine tests: ordered and independent interpretation performed.    Details: No acute ischemia.  Risk Decision regarding hospitalization.   Based on presentation, patient seems to have CHF.  She is not in distress.  Family later.  In tells me that she is supposed to be on oxygen but does not wear it.  This is more likely a cause of her hypoxia but at the same time she also has an AKI and appears fluid overloaded.  She has some soft blood pressures here so I will discuss with cardiology, Dr. Hyacinth Meeker, and will admit to the hospitalist service, Dr. Allena Katz to admit.        Final Clinical Impression(s) / ED Diagnoses Final diagnoses:  Acute on chronic respiratory failure with hypoxia [facility])    Rx / DC Orders ED Discharge Orders     None         Pricilla Loveless, MD 01/13/23 2253
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
## 2023-01-13 NOTE — Hospital Course (Signed)
[name] [name] is a 67 y.o. female with medical history significant for chronic HFpEF (EF 60-65% 08/2021), chronic hypoxia on 4 L O2 [place] (nonadherent), CKD stage IIIb, insulin-dependent T2DM, HLD, functional quadriplegia, OSA/OHS nonadherent to CPAP who is admitted with acute on chronic HFpEF.
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
## 2023-01-13 NOTE — ED Notes (Signed)
Xray at bedsde
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
## 2023-01-14 ENCOUNTER — Encounter (HOSPITAL_COMMUNITY): Payer: Self-pay | Admitting: Internal Medicine

## 2023-01-14 ENCOUNTER — Other Ambulatory Visit: Payer: Self-pay

## 2023-01-14 DIAGNOSIS — J9621 Acute and chronic respiratory failure with hypoxia: Secondary | ICD-10-CM | POA: Diagnosis not present

## 2023-01-14 DIAGNOSIS — I5033 Acute on chronic diastolic (congestive) heart failure: Secondary | ICD-10-CM | POA: Diagnosis not present

## 2023-01-14 DIAGNOSIS — J9611 Chronic respiratory failure with hypoxia: Secondary | ICD-10-CM | POA: Diagnosis not present

## 2023-01-14 DIAGNOSIS — N189 Chronic kidney disease, unspecified: Secondary | ICD-10-CM

## 2023-01-14 DIAGNOSIS — J9622 Acute and chronic respiratory failure with hypercapnia: Secondary | ICD-10-CM | POA: Diagnosis not present

## 2023-01-14 DIAGNOSIS — R532 Functional quadriplegia: Secondary | ICD-10-CM

## 2023-01-14 DIAGNOSIS — N179 Acute kidney failure, unspecified: Secondary | ICD-10-CM

## 2023-01-14 DIAGNOSIS — G4733 Obstructive sleep apnea (adult) (pediatric): Secondary | ICD-10-CM

## 2023-01-14 DIAGNOSIS — G9340 Encephalopathy, unspecified: Secondary | ICD-10-CM | POA: Diagnosis not present

## 2023-01-14 LAB — BLOOD GAS, ARTERIAL
Acid-Base Excess: 4.2 mmol/L — ABNORMAL HIGH (ref 0.0–2.0)
Acid-Base Excess: 3.9 mmol/L — ABNORMAL HIGH (ref 0.0–2.0)
Bicarbonate: 34.3 mmol/L — ABNORMAL HIGH (ref 20.0–28.0)
Bicarbonate: 33.9 mmol/L — ABNORMAL HIGH (ref 20.0–28.0)
Drawn by: 365271
O2 Saturation: 92.9 %
O2 Saturation: 92.5 %
Patient temperature: 37
Patient temperature: 37
pCO2 arterial: 80 mm[Hg] (ref 32–48)
pCO2 arterial: 79 mm[Hg] (ref 32–48)
pH, Arterial: 7.24 — ABNORMAL LOW (ref 7.35–7.45)
pH, Arterial: 7.24 — ABNORMAL LOW (ref 7.35–7.45)
pO2, Arterial: 68 mm[Hg] — ABNORMAL LOW (ref 83–108)
pO2, Arterial: 63 mm[Hg] — ABNORMAL LOW (ref 83–108)

## 2023-01-14 LAB — URINALYSIS, W/ REFLEX TO CULTURE (INFECTION SUSPECTED)
Bilirubin Urine: NEGATIVE
Glucose, UA: NEGATIVE mg/dL
Hgb urine dipstick: NEGATIVE
Ketones, ur: NEGATIVE mg/dL
Leukocytes,Ua: NEGATIVE
Nitrite: NEGATIVE
Protein, ur: NEGATIVE mg/dL
Specific Gravity, Urine: 1.012 (ref 1.005–1.030)
pH: 5 (ref 5.0–8.0)

## 2023-01-14 LAB — CREATININE, URINE, RANDOM: Creatinine, Urine: 152 mg/dL

## 2023-01-14 LAB — GLUCOSE, CAPILLARY
Glucose-Capillary: 136 mg/dL — ABNORMAL HIGH (ref 70–99)
Glucose-Capillary: 173 mg/dL — ABNORMAL HIGH (ref 70–99)
Glucose-Capillary: 177 mg/dL — ABNORMAL HIGH (ref 70–99)
Glucose-Capillary: 210 mg/dL — ABNORMAL HIGH (ref 70–99)

## 2023-01-14 LAB — CBC
HCT: 59 % — ABNORMAL HIGH (ref 36.0–46.0)
Hemoglobin: 15.9 g/dL — ABNORMAL HIGH (ref 12.0–15.0)
MCH: 22.7 pg — ABNORMAL LOW (ref 26.0–34.0)
MCHC: 26.9 g/dL — ABNORMAL LOW (ref 30.0–36.0)
MCV: 84.3 fL (ref 80.0–100.0)
Platelets: 313 10*3/uL (ref 150–400)
RBC: 7 MIL/uL — ABNORMAL HIGH (ref 3.87–5.11)
RDW: 22.9 % — ABNORMAL HIGH (ref 11.5–15.5)
WBC: 5.9 10*3/uL (ref 4.0–10.5)
nRBC: 0 % (ref 0.0–0.2)

## 2023-01-14 LAB — BASIC METABOLIC PANEL
Anion gap: 15 (ref 5–15)
BUN: 53 mg/dL — ABNORMAL HIGH (ref 8–23)
CO2: 22 mmol/L (ref 22–32)
Calcium: 9.5 mg/dL (ref 8.9–10.3)
Chloride: 102 mmol/L (ref 98–111)
Creatinine, Ser: 2.62 mg/dL — ABNORMAL HIGH (ref 0.44–1.00)
GFR, Estimated: 19 mL/min — ABNORMAL LOW (ref >60)
Glucose, Bld: 156 mg/dL — ABNORMAL HIGH (ref 70–99)
Potassium: 4.8 mmol/L (ref 3.5–5.1)
Sodium: 139 mmol/L (ref 135–145)

## 2023-01-14 LAB — SODIUM, URINE, RANDOM: Sodium, Ur: 16 mmol/L

## 2023-01-14 MED ORDER — MIDODRINE HCL 5 MG PO TABS: 5.0000 mg | ORAL_TABLET | Freq: Three times a day (TID) | ORAL | Status: DC

## 2023-01-14 MED ORDER — MIDODRINE HCL 5 MG PO TABS
10.0000 mg | ORAL_TABLET | Freq: Three times a day (TID) | ORAL | Status: DC
  Administered 2023-01-15 – 2023-01-17 (×5): 10 mg via ORAL
  Filled 2023-01-14 (×5): qty 2

## 2023-01-14 MED ORDER — IPRATROPIUM-ALBUTEROL 0.5-2.5 (3) MG/3ML IN SOLN: 3.0000 mL | RESPIRATORY_TRACT | Status: DC | PRN

## 2023-01-14 MED ORDER — SODIUM CHLORIDE 0.9 % IV BOLUS
250.0000 mL | Freq: Once | INTRAVENOUS | Status: AC
  Administered 2023-01-14: 250 mL via INTRAVENOUS

## 2023-01-14 MED ORDER — FUROSEMIDE 10 MG/ML IJ SOLN: 20.0000 mg | Freq: Every day | INTRAMUSCULAR | Status: DC

## 2023-01-14 NOTE — Progress Notes (Signed)
Progress Note  Patient Name: [name] [name] Date of Encounter: 01/14/2023  Primary Cardiologist: None  Subjective   No acute events overnight, lethargic this morning.  Inpatient Medications    Scheduled Meds:  atorvastatin  10 mg Oral QHS   heparin  5,000 Units Subcutaneous Q8H   insulin aspart  0-5 Units Subcutaneous QHS   insulin aspart  0-9 Units Subcutaneous TID WC   insulin glargine-yfgn  20 Units Subcutaneous QHS   sodium chloride flush  3 mL Intravenous Q12H   Continuous Infusions:  PRN Meds: acetaminophen **OR** acetaminophen, ondansetron **OR** ondansetron (ZOFRAN) IV, senna-docusate   Vital Signs    Vitals:   01/14/23 0308 01/14/23 0755 01/14/23 0901 01/14/23 1220  BP: 116/70 114/64  105/63  Pulse: 84 96  87  Resp: 16 (!) 21    Temp: 97.6 F (36.4 C) 97.6 F (36.4 C)  98.3 F (36.8 C)  TempSrc:  Oral  Oral  SpO2: 94% (!) 85%    Weight:   134.4 kg   Height:   5' 2.5" (1.588 m)     Intake/Output Summary (Last 24 hours) at 01/14/2023 1227 Last data filed at 01/14/2023 0400 Gross per 24 hour  Intake --  Output 450 ml  Net -450 ml   Filed Weights   01/14/23 0901  Weight: 134.4 kg    Telemetry     Personally reviewed, NSR.  ECG    Not performed today.  Physical Exam   GEN: No acute distress.   Neck: Unable to examine JVD due to body habitus Cardiac: RRR, no murmur, rub, or gallop.  Respiratory: Nonlabored. Clear to auscultation bilaterally. GI: Soft, nontender, bowel sounds present. MS: No edema; No deformity. Neuro:  Nonfocal. Psych: Lethargic.  Labs    Chemistry Recent Labs  Lab 01/13/23 1811 01/14/23 0340  NA 139 139  K 4.5 4.8  CL 100 102  CO2 27 22  GLUCOSE 178* 156*  BUN 52* 53*  CREATININE 2.47* 2.62*  CALCIUM 9.3 9.5  PROT 8.0  --   ALBUMIN 3.1*  --   AST 13*  --   ALT 11  --   ALKPHOS 147*  --   BILITOT 0.7  --   GFRNONAA 21* 19*  ANIONGAP 12 15     Hematology Recent Labs  Lab 01/13/23 1811  01/14/23 0340  WBC 5.7 5.9  RBC 6.50* 7.00*  HGB 14.8 15.9*  HCT 53.6* 59.0*  MCV 82.5 84.3  MCH 22.8* 22.7*  MCHC 27.6* 26.9*  RDW 22.5* 22.9*  PLT 324 313    Cardiac Enzymes Recent Labs  Lab 01/13/23 1811 01/13/23 2000  TROPONINIHS 9 9    BNP Recent Labs  Lab 01/13/23 1811  BNP 334.2*     DDimerNo results for input(s): "DDIMER" in the last 168 hours.   Radiology    US RENAL  Result Date: 01/14/2023 CLINICAL DATA:  Acute kidney injury EXAM: RENAL / URINARY TRACT ULTRASOUND COMPLETE COMPARISON:  CT chest abdomen and pelvis 10/02/2022 FINDINGS: Right Kidney: Renal measurements: 10.3 x 4.7 x 4.2 cm = volume: 104 mL. Echogenicity within normal limits. No mass or hydronephrosis visualized. Left Kidney: Renal measurements: 9.8 x 4.5 x 4.8 cm = volume: 109 mL. Echogenicity within normal limits. No mass or hydronephrosis visualized. Bladder: Appears normal for degree of bladder distention. Other: None. IMPRESSION: Normal renal ultrasound. Electronically Signed   By: Darliss Cheney M.D.   On: 01/14/2023 00:47   DG Chest Portable 1 View  Result  Date: 01/13/2023 CLINICAL DATA:  Hypoxia. EXAM: PORTABLE CHEST 1 VIEW COMPARISON:  Chest radiograph 09/25/2021, CT 10/02/2022 FINDINGS: Chronic cardiomegaly, unchanged. Minor retrocardiac atelectasis at the left lung base. No confluent airspace disease, pleural effusion, or pneumothorax. No pulmonary edema. Chronic widening of the right acromioclavicular joint IMPRESSION: Chronic cardiomegaly. No acute chest findings. Electronically Signed   By: Narda Rutherford M.D.   On: 01/13/2023 18:50     Assessment & Plan     Acute on chronic diastolic heart failure RV systolic dysfunction with RV enlargement AKI on CKD   -Patient is bedbound, presented with worsening SOB at rest (while lying flat), BNP mildly elevated 300s, continue IV Lasix 40 mg twice daily. Lethargic this morning, will obtain ABG. Diagnosed with OSA/OHS but noncompliant with  CPAP.  Echocardiogram is pending.     Signed, Marjo Bicker, MD  01/14/2023, 12:27 PM
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
## 2023-01-14 NOTE — Progress Notes (Addendum)
[facility]                                                                              [name], is a 67 y.o. female, DOB - [birth year], AOZ:[id number] Admit date - 01/13/2023    Outpatient Primary MD for the patient is [name]  LOS - 1  days  Chief Complaint  Patient presents with   Shortness of Breath       Brief summary   Patient is a 67 year old female with chronic HFpEF (EF 60-65% 08/2021), chronic hypoxia on 4 L O2 [place] (nonadherent), CKD stage IIIb, insulin-dependent T2DM, HLD, functional quadriplegia, OSA/OHS nonadherent to CPAP who presented to the ED for evaluation of shortness of breath.  Patient receives most of medical care through the [place] system. She says she is nonambulatory and is essentially bedbound.  Patient reported that for the last week she has been having worsening shortness of breath, occurring while at rest and worse on laying flat with increased swelling in both of her legs compared to baseline. She reports decreased urine output than expected.  No fever chills, chest pain.   Chest x-ray showed chronic cardiomegaly without focal consolidation or effusion. Creatinine 2.47. BNP 334.2, troponin 9  Cardiology consulted.  Patient admitted for further workup  Assessment & Plan    Principal Problem:   Acute on chronic heart failure with preserved ejection fraction (HFpEF) (HCC) -Presented with increasing shortness of breath, peripheral edema, BNP 315.  Previous echo in 08/2021 with a EF of 60 to 65%. -Will repeat echo, received Lasix 40 mg IV x 1 overnight, creatinine worsened to 2.6 today -Hold off Lasix, will follow cardiology recommendations -Strict I's and O's and daily weights -Cardiology following   Active problems  Acute kidney injury superimposed on CKD stage IIIb: -Creatinine on admission is 2.47, previously 1.63 on 10/07/2022.  [facility] creatinine was 2.27 on 10/31 and 2.55 on  11/20.  Patient may either have AKI on CKD secondary to volume overload versus progression toward CKD stage IV. -Creatinine worsened to 2.6 today, likely due to Lasix 40 mg IV x 1 overnight -Ultrasound normal, no obstruction or hydronephrosis -Albumin 3.1, may benefit from IV albumin/IV Lasix, will await cardiology recommendations   Acute on Chronic respiratory failure with hypoxia: -Supposed to wear 4 L O2 via [place] at baseline but admits that she does not use her home O2 regularly.  - SpO2 improved when she is adherent. Also, OSA and obesity hypoventilation, medications (oxycodone, xanax, neurontin, lyrica) -Continue supplemental O2 at 4L via [place] Addendum 2:55pm ABG with hypercarbia, pCO2 80, pH 7.2 - will place on Bipap and recheck ABG in 2 hrs  - patient was requesting oxycodone, xanax, were not added due to her mental status. -hold all sedating meds  Addendum 5:00pm - will Repeat ABG, still lethargic -BP soft, will give NS 250cc bolus -d/w Dr Judeth Horn for CCM eval, Full code    Insulin-dependent type 2 diabetes, uncontrolled with hyperglycemia: -Hemoglobin A1c 9.3 on 10/03/2022, will check CBG (last 3)  Recent Labs    01/13/23 2315 01/14/23 0753  GLUCAP  177* 136*   -Continue Semglee 20 units nightly, sliding scale insulin   Hyperlipidemia: Continue atorvastatin.   OSA/OHS: Not using CPAP regularly.  Will use in hospital nightly.   Morbid obesity with functional quadriplegia Estimated body mass index is 53.33 kg/m as calculated from the following:   Height as of this encounter: 5' 2.5" (1.588 m).   Weight as of this encounter: 134.4 kg. PT evaluation  Code Status: Full code DVT Prophylaxis:  heparin injection 5,000 Units Start: 01/13/23 2200   Level of Care: Level of care: Telemetry Cardiac Family Communication: Updated patient Disposition Plan:      Remains inpatient appropriate: Worsening renal function, needs IV diuresis however limiting with BP/Cr, awaiting  cardiology recommendations   Procedures:    Consultants:   Cardiology  Antimicrobials:   Anti-infectives (From admission, onward)    None          Medications  atorvastatin  10 mg Oral QHS   heparin  5,000 Units Subcutaneous Q8H   insulin aspart  0-5 Units Subcutaneous QHS   insulin aspart  0-9 Units Subcutaneous TID WC   insulin glargine-yfgn  20 Units Subcutaneous QHS   sodium chloride flush  3 mL Intravenous Q12H      Subjective:   [name] [name] was seen and examined today.  Had O2 off, feels that her shortness of breath did feel better after IV Lasix overnight.  No chest pain, abdominal pain, N/V/D/C, new weakness, numbess, tingling. No acute events overnight.    Objective:   Vitals:   01/13/23 2345 01/14/23 0308 01/14/23 0755 01/14/23 0901  BP:  116/70 114/64   Pulse: 79 84 96   Resp: 20 16 (!) 21   Temp:  97.6 F (36.4 C) 97.6 F (36.4 C)   TempSrc:   Oral   SpO2: 93% 94% (!) 85%   Weight:    134.4 kg  Height:    5' 2.5" (1.588 m)    Intake/Output Summary (Last 24 hours) at 01/14/2023 0916 Last data filed at 01/14/2023 0400 Gross per 24 hour  Intake --  Output 450 ml  Net -450 ml     Wt Readings from Last 3 Encounters:  01/14/23 134.4 kg  10/02/22 128.4 kg  12/01/21 136.1 kg     Exam General: Alert and oriented x 3, NAD Cardiovascular: S1 S2 auscultated,  RRR, JVP difficult to assess due to habitus Respiratory: Diminished breath sound at the bases Gastrointestinal: Soft, nontender, nondistended, + bowel sounds Ext: 2+ pedal edema bilaterally Neuro: no new deficits Skin: No rashes Psych: Normal affect     Data Reviewed:  I have personally reviewed following labs    CBC Lab Results  Component Value Date   WBC 5.9 01/14/2023   RBC 7.00 (H) 01/14/2023   HGB 15.9 (H) 01/14/2023   HCT 59.0 (H) 01/14/2023   MCV 84.3 01/14/2023   MCH 22.7 (L) 01/14/2023   PLT 313 01/14/2023   MCHC 26.9 (L) 01/14/2023   RDW 22.9 (H)  01/14/2023   LYMPHSABS 1.8 01/13/2023   MONOABS 0.3 01/13/2023   EOSABS 0.4 01/13/2023   BASOSABS 0.1 01/13/2023     Last metabolic panel Lab Results  Component Value Date   NA 139 01/14/2023   K 4.8 01/14/2023   CL 102 01/14/2023   CO2 22 01/14/2023   BUN 53 (H) 01/14/2023   CREATININE 2.62 (H) 01/14/2023   GLUCOSE 156 (H) 01/14/2023   GFRNONAA 19 (L) 01/14/2023   GFRAA 44 (L) 11/23/2019  CALCIUM 9.5 01/14/2023   PHOS 3.6 10/05/2022   PROT 8.0 01/13/2023   ALBUMIN 3.1 (L) 01/13/2023   LABGLOB 2.9 06/28/2014   AGRATIO 1.1 06/28/2014   BILITOT 0.7 01/13/2023   ALKPHOS 147 (H) 01/13/2023   AST 13 (L) 01/13/2023   ALT 11 01/13/2023   ANIONGAP 15 01/14/2023    CBG (last 3)  Recent Labs    01/13/23 2315 01/14/23 0753  GLUCAP 177* 136*      Coagulation Profile: No results for input(s): "INR", "PROTIME" in the last 168 hours.   Radiology Studies: I have personally reviewed the imaging studies  US RENAL  Result Date: 01/14/2023 CLINICAL DATA:  Acute kidney injury EXAM: RENAL / URINARY TRACT ULTRASOUND COMPLETE COMPARISON:  CT chest abdomen and pelvis 10/02/2022 FINDINGS: Right Kidney: Renal measurements: 10.3 x 4.7 x 4.2 cm = volume: 104 mL. Echogenicity within normal limits. No mass or hydronephrosis visualized. Left Kidney: Renal measurements: 9.8 x 4.5 x 4.8 cm = volume: 109 mL. Echogenicity within normal limits. No mass or hydronephrosis visualized. Bladder: Appears normal for degree of bladder distention. Other: None. IMPRESSION: Normal renal ultrasound. Electronically Signed   By: Darliss Cheney M.D.   On: 01/14/2023 00:47   DG Chest Portable 1 View  Result Date: 01/13/2023 CLINICAL DATA:  Hypoxia. EXAM: PORTABLE CHEST 1 VIEW COMPARISON:  Chest radiograph 09/25/2021, CT 10/02/2022 FINDINGS: Chronic cardiomegaly, unchanged. Minor retrocardiac atelectasis at the left lung base. No confluent airspace disease, pleural effusion, or pneumothorax. No pulmonary edema.  Chronic widening of the right acromioclavicular joint IMPRESSION: Chronic cardiomegaly. No acute chest findings. Electronically Signed   By: Narda Rutherford M.D.   On: 01/13/2023 18:50       Denisha Hoel M.D. [facility] 01/14/2023, 9:16 AM  Available via Epic secure chat 7am-7pm After 7 pm, please refer to night coverage provider listed on amion.

## 2023-01-14 NOTE — Progress Notes (Signed)
   01/14/23 2156  BiPAP/CPAP/SIPAP  $ Non-Invasive Ventilator  Non-Invasive Vent Subsequent  BiPAP/CPAP/SIPAP Pt Type Adult  BiPAP/CPAP/SIPAP SERVO  Reason BIPAP/CPAP not in use Non-compliant (pt states does not want to wear bipap at this time. no resp distress noted will continue to monitor)

## 2023-01-15 ENCOUNTER — Other Ambulatory Visit (HOSPITAL_COMMUNITY): Payer: No Typology Code available for payment source

## 2023-01-15 DIAGNOSIS — I5033 Acute on chronic diastolic (congestive) heart failure: Secondary | ICD-10-CM | POA: Diagnosis not present

## 2023-01-15 DIAGNOSIS — J9602 Acute respiratory failure with hypercapnia: Secondary | ICD-10-CM | POA: Diagnosis not present

## 2023-01-15 DIAGNOSIS — J9621 Acute and chronic respiratory failure with hypoxia: Secondary | ICD-10-CM | POA: Diagnosis not present

## 2023-01-15 DIAGNOSIS — J9611 Chronic respiratory failure with hypoxia: Secondary | ICD-10-CM | POA: Diagnosis not present

## 2023-01-15 DIAGNOSIS — N179 Acute kidney failure, unspecified: Secondary | ICD-10-CM | POA: Diagnosis not present

## 2023-01-15 DIAGNOSIS — I5032 Chronic diastolic (congestive) heart failure: Secondary | ICD-10-CM

## 2023-01-15 LAB — ECHOCARDIOGRAM COMPLETE
Height: 62.5 in
S' Lateral: 2.8 cm
Weight: 4754.88 [oz_av]

## 2023-01-15 LAB — CBC
HCT: 48.3 % — ABNORMAL HIGH (ref 36.0–46.0)
Hemoglobin: 13.2 g/dL (ref 12.0–15.0)
MCH: 22.6 pg — ABNORMAL LOW (ref 26.0–34.0)
MCHC: 27.3 g/dL — ABNORMAL LOW (ref 30.0–36.0)
MCV: 82.8 fL (ref 80.0–100.0)
Platelets: 330 10*3/uL (ref 150–400)
RBC: 5.83 MIL/uL — ABNORMAL HIGH (ref 3.87–5.11)
RDW: 21.9 % — ABNORMAL HIGH (ref 11.5–15.5)
WBC: 5.7 10*3/uL (ref 4.0–10.5)
nRBC: 0 % (ref 0.0–0.2)

## 2023-01-15 LAB — GLUCOSE, CAPILLARY
Glucose-Capillary: 150 mg/dL — ABNORMAL HIGH (ref 70–99)
Glucose-Capillary: 166 mg/dL — ABNORMAL HIGH (ref 70–99)
Glucose-Capillary: 210 mg/dL — ABNORMAL HIGH (ref 70–99)
Glucose-Capillary: 181 mg/dL — ABNORMAL HIGH (ref 70–99)

## 2023-01-15 LAB — RENAL FUNCTION PANEL
Albumin: 2.8 g/dL — ABNORMAL LOW (ref 3.5–5.0)
Anion gap: 13 (ref 5–15)
BUN: 54 mg/dL — ABNORMAL HIGH (ref 8–23)
CO2: 28 mmol/L (ref 22–32)
Calcium: 8.9 mg/dL (ref 8.9–10.3)
Chloride: 98 mmol/L (ref 98–111)
Creatinine, Ser: 2.96 mg/dL — ABNORMAL HIGH (ref 0.44–1.00)
GFR, Estimated: 17 mL/min — ABNORMAL LOW (ref >60)
Glucose, Bld: 163 mg/dL — ABNORMAL HIGH (ref 70–99)
Phosphorus: 4.8 mg/dL — ABNORMAL HIGH (ref 2.5–4.6)
Potassium: 4.6 mmol/L (ref 3.5–5.1)
Sodium: 139 mmol/L (ref 135–145)

## 2023-01-15 LAB — HEMOGLOBIN A1C
Hgb A1c MFr Bld: 9.2 % — ABNORMAL HIGH (ref 4.8–5.6)
Mean Plasma Glucose: 217.34 mg/dL

## 2023-01-15 MED ORDER — INSULIN ASPART 100 UNIT/ML IJ SOLN: 3.0000 [IU] | Freq: Three times a day (TID) | INTRAMUSCULAR | Status: DC

## 2023-01-15 MED ORDER — NYSTATIN 100000 UNIT/GM EX POWD
Freq: Two times a day (BID) | CUTANEOUS | Status: DC
  Filled 2023-01-15: qty 15

## 2023-01-15 MED ORDER — LORATADINE 10 MG PO TABS
10.0000 mg | ORAL_TABLET | Freq: Every day | ORAL | Status: DC
  Administered 2023-01-15 – 2023-01-17 (×3): 10 mg via ORAL
  Filled 2023-01-15 (×3): qty 1

## 2023-01-15 MED ORDER — MELATONIN 3 MG PO TABS: 3.0000 mg | ORAL_TABLET | Freq: Every evening | ORAL | Status: DC | PRN

## 2023-01-15 MED ORDER — MIRABEGRON ER 25 MG PO TB24
25.0000 mg | ORAL_TABLET | Freq: Every day | ORAL | Status: DC
  Administered 2023-01-15 – 2023-01-17 (×3): 25 mg via ORAL
  Filled 2023-01-15 (×3): qty 1

## 2023-01-15 MED ORDER — ESCITALOPRAM OXALATE 10 MG PO TABS
10.0000 mg | ORAL_TABLET | Freq: Every morning | ORAL | Status: DC
  Administered 2023-01-15 – 2023-01-17 (×3): 10 mg via ORAL
  Filled 2023-01-15 (×3): qty 1

## 2023-01-15 MED ORDER — ALLOPURINOL 100 MG PO TABS
50.0000 mg | ORAL_TABLET | ORAL | Status: DC
  Administered 2023-01-15 – 2023-01-17 (×2): 50 mg via ORAL
  Filled 2023-01-15 (×2): qty 1

## 2023-01-15 MED ORDER — OXYCODONE HCL 5 MG PO TABS: 5.0000 mg | ORAL_TABLET | Freq: Three times a day (TID) | ORAL | Status: DC | PRN

## 2023-01-15 MED ORDER — INSULIN ASPART 100 UNIT/ML IJ SOLN
3.0000 [IU] | Freq: Three times a day (TID) | INTRAMUSCULAR | Status: DC
  Administered 2023-01-16 (×3): 3 [IU] via SUBCUTANEOUS

## 2023-01-15 MED ORDER — HYDROXYZINE PAMOATE 25 MG PO CAPS: 25.0000 mg | ORAL_CAPSULE | Freq: Two times a day (BID) | ORAL | Status: DC | PRN

## 2023-01-15 MED ORDER — ALBUMIN HUMAN 25 % IV SOLN
25.0000 g | Freq: Four times a day (QID) | INTRAVENOUS | Status: AC
  Administered 2023-01-15 – 2023-01-16 (×3): 25 g via INTRAVENOUS
  Filled 2023-01-15 (×3): qty 100

## 2023-01-15 MED ORDER — INSULIN GLARGINE-YFGN 100 UNIT/ML ~~LOC~~ SOLN
24.0000 [IU] | Freq: Every day | SUBCUTANEOUS | Status: DC
Start: 2023-01-15 — End: 2023-01-17
  Administered 2023-01-15 – 2023-01-16 (×2): 24 [IU] via SUBCUTANEOUS
  Filled 2023-01-15 (×3): qty 0.24

## 2023-01-15 MED ORDER — ACETAMINOPHEN 500 MG PO TABS: 1000.0000 mg | ORAL_TABLET | Freq: Three times a day (TID) | ORAL | Status: DC | PRN

## 2023-01-15 MED ORDER — PANTOPRAZOLE SODIUM 40 MG PO TBEC
40.0000 mg | DELAYED_RELEASE_TABLET | Freq: Every day | ORAL | Status: DC
  Administered 2023-01-16 – 2023-01-17 (×2): 40 mg via ORAL
  Filled 2023-01-15 (×2): qty 1

## 2023-01-15 MED ORDER — MIRTAZAPINE 15 MG PO TABS: 15.0000 mg | ORAL_TABLET | Freq: Every evening | ORAL | Status: DC | PRN

## 2023-01-15 MED ORDER — MELATONIN 3 MG PO TABS
3.0000 mg | ORAL_TABLET | Freq: Every evening | ORAL | Status: DC | PRN
  Administered 2023-01-15: 3 mg via ORAL
  Filled 2023-01-15: qty 1

## 2023-01-15 MED ORDER — PREGABALIN 50 MG PO CAPS
50.0000 mg | ORAL_CAPSULE | Freq: Two times a day (BID) | ORAL | Status: DC
  Administered 2023-01-15 – 2023-01-17 (×4): 50 mg via ORAL
  Filled 2023-01-15 (×5): qty 1

## 2023-01-15 NOTE — Progress Notes (Signed)
[facility]                                                                              Arnold Goldy, is a 67 y.o. female, DOB - Aug 03, 1955, VHQ:[id number] Admit date - 01/13/2023    Outpatient Primary MD for the patient is [name]  LOS - 2  days  Chief Complaint  Patient presents with   Shortness of Breath       Brief summary   Patient is a 67 year old female with chronic HFpEF (EF 60-65% 08/2021), chronic hypoxia on 4 L O2 [place] (nonadherent), CKD stage IIIb, insulin-dependent T2DM, HLD, functional quadriplegia, OSA/OHS nonadherent to CPAP who presented to the ED for evaluation of shortness of breath.  Patient receives most of medical care through the [place] system. She says she is nonambulatory and is essentially bedbound.  Patient reported that for the last week she has been having worsening shortness of breath, occurring while at rest and worse on laying flat with increased swelling in both of her legs compared to baseline. She reports decreased urine output than expected.  No fever chills, chest pain.   Chest x-ray showed chronic cardiomegaly without focal consolidation or effusion. Creatinine 2.47. BNP 334.2, troponin 9  Cardiology consulted.  Patient admitted for further workup  Assessment & Plan    Principal Problem:   Acute on chronic heart failure with preserved ejection fraction (HFpEF) (HCC) -Presented with increasing shortness of breath, peripheral edema, BNP 315.  Previous echo in 08/2021 with a EF of 60 to 65%. -received Lasix 40 mg IV x 1 on 11/22, creatinine worsened to 2.6 on 11/23 -Continue to hold off Lasix, creatinine worse to 2.9 -Strict I's and O's and daily weights, cardiology following  -2D echo pending   Active problems  Acute kidney injury superimposed on CKD stage IIIb: -Creatinine on admission is 2.47, previously 1.63 on 10/07/2022.  [facility] creatinine was 2.27 on 10/31 and 2.55 on  11/20.  Patient may either have AKI on CKD secondary to volume overload versus progression toward CKD stage IV. -Renal function worse to 2.9 today.  UA with no UTI. -Ultrasound normal, no obstruction or hydronephrosis -Albumin 2.8, will place on IV albumin   Acute on Chronic respiratory failure with hypoxia, hypercarbia: -Supposed to wear 4 L O2 via [place] at baseline but admits that she does not use her home O2 regularly.  - SpO2 improved when she is adherent. Also, OSA and obesity hypoventilation, medications (oxycodone, xanax, neurontin, lyrica) -On 11/23 ABG with pH 7.2, pCO2 80, placed on BiPAP -Much more alert and oriented today, close to her baseline mental status.  Counseled to be compliant with BiPAP -Continue BiPAP nightly, PRN during naps.   Insulin-dependent type 2 diabetes, uncontrolled with hyperglycemia: -Hemoglobin A1c 9.2 CBG (last 3)  Recent Labs    01/14/23 1558 01/14/23 2109 01/15/23 0818  GLUCAP 177* 210* 150*   -Increase NovoLog to 24 units nightly, added NovoLog 3 units 3 times daily AC, SSI     Hyperlipidemia: Continue atorvastatin.   OSA/OHS: Not using CPAP regularly.  Will use in hospital nightly.  Morbid obesity with functional quadriplegia Estimated body mass index is 53.49 kg/m as calculated from the following:   Height as of this encounter: 5' 2.5" (1.588 m).   Weight as of this encounter: 134.8 kg. PT evaluation  Code Status: Full code DVT Prophylaxis:  heparin injection 5,000 Units Start: 01/13/23 2200   Level of Care: Level of care: Progressive Family Communication: Updated patient Disposition Plan:      Remains inpatient appropriate: Bedbound  Procedures:    Consultants:   Cardiology  Antimicrobials:   Anti-infectives (From admission, onward)    None          Medications  atorvastatin  10 mg Oral QHS   heparin  5,000 Units Subcutaneous Q8H   insulin aspart  0-5 Units Subcutaneous QHS   insulin aspart  0-9 Units  Subcutaneous TID WC   insulin glargine-yfgn  20 Units Subcutaneous QHS   midodrine  10 mg Oral TID WC   sodium chloride flush  3 mL Intravenous Q12H      Subjective:   [name] [name] was seen and examined today.  This morning off the BiPAP, alert and oriented, feels better.  No acute nausea vomiting, chest pain.  No fevers.   Objective:   Vitals:   01/15/23 0124 01/15/23 0257 01/15/23 0720 01/15/23 0833  BP:  104/62 (!) 101/55 111/78  Pulse: 91 91  84  Resp: (!) 22 18  12   Temp:  98.2 F (36.8 C) 97.9 F (36.6 C)   TempSrc:  Oral Oral   SpO2: 95% 93%    Weight:  134.8 kg    Height:        Intake/Output Summary (Last 24 hours) at 01/15/2023 1132 Last data filed at 01/15/2023 0301 Gross per 24 hour  Intake 252.44 ml  Output 350 ml  Net -97.56 ml     Wt Readings from Last 3 Encounters:  01/15/23 134.8 kg  10/02/22 128.4 kg  12/01/21 136.1 kg    Physical Exam General: Alert and oriented x 3, NAD Cardiovascular: S1 S2 clear, RRR.  JVP difficult to assess due to body habitus Respiratory: CTAB, no wheezing Gastrointestinal: Soft, nontender, nondistended, NBS Ext: no pedal edema bilaterally Neuro: no new deficits Skin: No rashes Psych: Normal affect     Data Reviewed:  I have personally reviewed following labs    CBC Lab Results  Component Value Date   WBC 5.7 01/15/2023   RBC 5.83 (H) 01/15/2023   HGB 13.2 01/15/2023   HCT 48.3 (H) 01/15/2023   MCV 82.8 01/15/2023   MCH 22.6 (L) 01/15/2023   PLT 330 01/15/2023   MCHC 27.3 (L) 01/15/2023   RDW 21.9 (H) 01/15/2023   LYMPHSABS 1.8 01/13/2023   MONOABS 0.3 01/13/2023   EOSABS 0.4 01/13/2023   BASOSABS 0.1 01/13/2023     Last metabolic panel Lab Results  Component Value Date   NA 139 01/15/2023   K 4.6 01/15/2023   CL 98 01/15/2023   CO2 28 01/15/2023   BUN 54 (H) 01/15/2023   CREATININE 2.96 (H) 01/15/2023   GLUCOSE 163 (H) 01/15/2023   GFRNONAA 17 (L) 01/15/2023   GFRAA 44 (L)  11/23/2019   CALCIUM 8.9 01/15/2023   PHOS 4.8 (H) 01/15/2023   PROT 8.0 01/13/2023   ALBUMIN 2.8 (L) 01/15/2023   LABGLOB 2.9 06/28/2014   AGRATIO 1.1 06/28/2014   BILITOT 0.7 01/13/2023   ALKPHOS 147 (H) 01/13/2023   AST 13 (L) 01/13/2023   ALT 11 01/13/2023   ANIONGAP  13 01/15/2023    CBG (last 3)  Recent Labs    01/14/23 1558 01/14/23 2109 01/15/23 0818  GLUCAP 177* 210* 150*      Coagulation Profile: No results for input(s): "INR", "PROTIME" in the last 168 hours.   Radiology Studies: I have personally reviewed the imaging studies  US RENAL  Result Date: 01/14/2023 CLINICAL DATA:  Acute kidney injury EXAM: RENAL / URINARY TRACT ULTRASOUND COMPLETE COMPARISON:  CT chest abdomen and pelvis 10/02/2022 FINDINGS: Right Kidney: Renal measurements: 10.3 x 4.7 x 4.2 cm = volume: 104 mL. Echogenicity within normal limits. No mass or hydronephrosis visualized. Left Kidney: Renal measurements: 9.8 x 4.5 x 4.8 cm = volume: 109 mL. Echogenicity within normal limits. No mass or hydronephrosis visualized. Bladder: Appears normal for degree of bladder distention. Other: None. IMPRESSION: Normal renal ultrasound. Electronically Signed   By: Darliss Cheney M.D.   On: 01/14/2023 00:47   DG Chest Portable 1 View  Result Date: 01/13/2023 CLINICAL DATA:  Hypoxia. EXAM: PORTABLE CHEST 1 VIEW COMPARISON:  Chest radiograph 09/25/2021, CT 10/02/2022 FINDINGS: Chronic cardiomegaly, unchanged. Minor retrocardiac atelectasis at the left lung base. No confluent airspace disease, pleural effusion, or pneumothorax. No pulmonary edema. Chronic widening of the right acromioclavicular joint IMPRESSION: Chronic cardiomegaly. No acute chest findings. Electronically Signed   By: Narda Rutherford M.D.   On: 01/13/2023 18:50       Capri Veals M.D. [facility] 01/15/2023, 11:32 AM  Available via Epic secure chat 7am-7pm After 7 pm, please refer to night coverage provider listed on amion.

## 2023-01-15 NOTE — Progress Notes (Signed)
PT Cancellation Note  Patient Details Name: DARRICA SKEHAN MRN: [id number] DOB: 26-Aug-1955   Cancelled Treatment:    Reason Eval/Treat Not Completed: Patient at procedure or test/unavailable at this time, off unit for ECHO. Will return and evaluate as time/schedule allow.   Vickki Muff, PT, DPT   Acute Rehabilitation Department Office [phone] Secure Chat Communication Preferred   Ronnie Derby 01/15/2023, 3:57 PM

## 2023-01-15 NOTE — Progress Notes (Signed)
Pt placed on BIPAP to rest.   01/15/23 0124  BiPAP/CPAP/SIPAP  $ Non-Invasive Ventilator  Non-Invasive Vent Subsequent  BiPAP/CPAP/SIPAP Pt Type Adult  BiPAP/CPAP/SIPAP SERVO  Mask Type Full face mask  Mask Size Large  Set Rate 16 breaths/min  Respiratory Rate 25 breaths/min  IPAP 16 cmH20  EPAP 6 cmH2O  PEEP 6 cmH20  FiO2 (%) 40 %  Minute Ventilation 15.6  Leak 75  Peak Inspiratory Pressure (PIP) 15  Tidal Volume (Vt) 545  Patient Home Equipment No  Auto Titrate No  Press High Alarm 35 cmH2O  BiPAP/CPAP /SiPAP Vitals  Pulse Rate 91  Resp (!) 22  SpO2 95 %  MEWS Score/Color  MEWS Score 1  MEWS Score Color [name] [name]

## 2023-01-15 NOTE — Progress Notes (Signed)
Progress Note  Patient Name: [name] [name] Date of Encounter: 01/15/2023  Primary Cardiologist: None  Subjective   No acute events overnight.  Inpatient Medications    Scheduled Meds:  atorvastatin  10 mg Oral QHS   heparin  5,000 Units Subcutaneous Q8H   insulin aspart  0-5 Units Subcutaneous QHS   insulin aspart  0-9 Units Subcutaneous TID WC   insulin glargine-yfgn  20 Units Subcutaneous QHS   midodrine  10 mg Oral TID WC   sodium chloride flush  3 mL Intravenous Q12H   Continuous Infusions:  PRN Meds: acetaminophen **OR** acetaminophen, ipratropium-albuterol, melatonin, ondansetron **OR** ondansetron (ZOFRAN) IV, senna-docusate   Vital Signs    Vitals:   01/15/23 0124 01/15/23 0257 01/15/23 0720 01/15/23 0833  BP:  104/62 (!) 101/55 111/78  Pulse: 91 91  84  Resp: (!) 22 18  12   Temp:  98.2 F (36.8 C) 97.9 F (36.6 C)   TempSrc:  Oral Oral   SpO2: 95% 93%    Weight:  134.8 kg    Height:        Intake/Output Summary (Last 24 hours) at 01/15/2023 1034 Last data filed at 01/15/2023 0301 Gross per 24 hour  Intake 252.44 ml  Output 350 ml  Net -97.56 ml   Filed Weights   01/14/23 0901 01/15/23 0257  Weight: 134.4 kg 134.8 kg    Telemetry     Personally reviewed, NSR.  ECG    Not performed today.  Physical Exam   GEN: No acute distress.   Neck: Unable to examine JVD due to body habitus Cardiac: RRR, no murmur, rub, or gallop.  Respiratory: Nonlabored. Clear to auscultation bilaterally. GI: Soft, nontender, bowel sounds present. MS: No edema; No deformity. Neuro:  Nonfocal. Psych: Alert and oriented x 3.  Labs    Chemistry Recent Labs  Lab 01/13/23 1811 01/14/23 0340 01/15/23 0357  NA 139 139 139  K 4.5 4.8 4.6  CL 100 102 98  CO2 27 22 28   GLUCOSE 178* 156* 163*  BUN 52* 53* 54*  CREATININE 2.47* 2.62* 2.96*  CALCIUM 9.3 9.5 8.9  PROT 8.0  --   --   ALBUMIN 3.1*  --  2.8*  AST 13*  --   --   ALT 11  --   --    ALKPHOS 147*  --   --   BILITOT 0.7  --   --   GFRNONAA 21* 19* 17*  ANIONGAP 12 15 13      Hematology Recent Labs  Lab 01/13/23 1811 01/14/23 0340 01/15/23 0357  WBC 5.7 5.9 5.7  RBC 6.50* 7.00* 5.83*  HGB 14.8 15.9* 13.2  HCT 53.6* 59.0* 48.3*  MCV 82.5 84.3 82.8  MCH 22.8* 22.7* 22.6*  MCHC 27.6* 26.9* 27.3*  RDW 22.5* 22.9* 21.9*  PLT 324 313 330    Cardiac Enzymes Recent Labs  Lab 01/13/23 1811 01/13/23 2000  TROPONINIHS 9 9    BNP Recent Labs  Lab 01/13/23 1811  BNP 334.2*     DDimerNo results for input(s): "DDIMER" in the last 168 hours.   Radiology    US RENAL  Result Date: 01/14/2023 CLINICAL DATA:  Acute kidney injury EXAM: RENAL / URINARY TRACT ULTRASOUND COMPLETE COMPARISON:  CT chest abdomen and pelvis 10/02/2022 FINDINGS: Right Kidney: Renal measurements: 10.3 x 4.7 x 4.2 cm = volume: 104 mL. Echogenicity within normal limits. No mass or hydronephrosis visualized. Left Kidney: Renal measurements: 9.8 x 4.5 x 4.8 cm =  volume: 109 mL. Echogenicity within normal limits. No mass or hydronephrosis visualized. Bladder: Appears normal for degree of bladder distention. Other: None. IMPRESSION: Normal renal ultrasound. Electronically Signed   By: Darliss Cheney M.D.   On: 01/14/2023 00:47   DG Chest Portable 1 View  Result Date: 01/13/2023 CLINICAL DATA:  Hypoxia. EXAM: PORTABLE CHEST 1 VIEW COMPARISON:  Chest radiograph 09/25/2021, CT 10/02/2022 FINDINGS: Chronic cardiomegaly, unchanged. Minor retrocardiac atelectasis at the left lung base. No confluent airspace disease, pleural effusion, or pneumothorax. No pulmonary edema. Chronic widening of the right acromioclavicular joint IMPRESSION: Chronic cardiomegaly. No acute chest findings. Electronically Signed   By: Narda Rutherford M.D.   On: 01/13/2023 18:50     Assessment & Plan     Acute on chronic hypercapnic respiratory failure Acute on chronic diastolic heart failure, compensated RV systolic  dysfunction with RV enlargement AKI on CKD, worsening   -Patient is bedbound, presented with worsening SOB at rest (while lying flat), BNP mildly elevated 300s, ABG was obtained yesterday due to lethargic/somnolent, pCO2 was noted to be 79.  Her mental status significantly improved after starting BiPAP.  Agree with discontinuing Lasix due to uptrending serum creatinine and possibly her SOB at rest could likely be secondary to acute hypercapnic respiratory failure.  She is more alert and oriented today, motivated to use CPAP/BiPAP at home.  Echocardiogram is pending but I suspect this will change management.     Signed, Marjo Bicker, MD  01/15/2023, 10:34 AM

## 2023-01-15 NOTE — Evaluation (Signed)
Physical Therapy Evaluation Patient Details Name: [name] [name] MRN: [id number] DOB: [birth year] Today's Date: 01/15/2023  History of Present Illness  The pt is a 67 yo female presenting 11/22 with SOB. PMH includes: HFrEF, chronic hypoxia on 4L (nonadherent), CKD III, DM II, HLD, OSA/OHS, class III obesity (BMI >40).   Clinical Impression  Pt in bed upon arrival of PT, agreeable to evaluation at this time. Prior to admission the pt was independent with rolling and repositioning in bed, but needed assistance from her aides to sit EOB, complete transfer (via hoyer) to [facility], and to complete all ADLs. The pt required modA to complete rolling in bed this session, demos poor functional ROM and strength in LE as well as poor core activation and strength to complete bed mobility, and will benefit from skilled PT acutely to progress back towards independence with bed mobility as pt is home alone between visits from her aides and needs to be able to perform pressure-relief when home alone.      If plan is discharge home, recommend the following: Two people to help with walking and/or transfers;A lot of help with bathing/dressing/bathroom;Assistance with cooking/housework   Can travel by private vehicle        Equipment Recommendations None recommended by PT  Recommendations for Other Services       Functional Status Assessment Patient has had a recent decline in their functional status and demonstrates the ability to make significant improvements in function in a reasonable and predictable amount of time.     Precautions / Restrictions Precautions Precautions: Fall Precaution Comments: non-ambulatory for "years" Restrictions Weight Bearing Restrictions: No      Mobility  Bed Mobility Overal bed mobility: Needs Assistance Bed Mobility: Rolling Rolling: Mod assist, Used rails         General bed mobility comments: moda for rolling to R (pt rpeorts this is her easiest way to roll)     Transfers                   General transfer comment: pt fatigued after rolling, unable to achieve without +2    Ambulation/Gait               General Gait Details: pt non-ambulatory at baseline       Pertinent Vitals/Pain Pain Assessment Pain Assessment: No/denies pain    Home Living Family/patient expects to be discharged to:: Private residence Living Arrangements: Alone Available Help at Discharge: Personal care attendant;Friend(s) (9-12 in AM and 6-8 in PM daily) Type of Home: House Home Access: Level entry       Home Layout: One level Home Equipment: Wheelchair - power;Hospital bed;Other (comment) (hoyer lift) Additional Comments: She has home health aides 7 days per week for 3 hrs in the a.m. and 2 hrs in the p.m.    Prior Function Prior Level of Function : Needs assist       Physical Assist : Mobility (physical);ADLs (physical) Mobility (physical): Transfers ADLs (physical): Bathing;Dressing;Toileting;IADLs Mobility Comments: pt not ambulated in "years" but is hopeful to start standing again. states she is able to sit EOB with assist and complete rolling in bed independently. using lift to get OOB but can propel power WC ADLs Comments: pt reports use of brief in chair, aides do all bathing and dressing in bed     Extremity/Trunk Assessment   Upper Extremity Assessment Upper Extremity Assessment: Generalized weakness (limited shoulder ROM and strength relative to her body mass)    Lower Extremity  Assessment Lower Extremity Assessment: LLE deficits/detail;RLE deficits/detail RLE Deficits / Details: limited ankle ROM (unable to achieve neutral), functional movement at knee and hip, limited by body habitus, poor strength relative to body weight RLE Sensation: WNL LLE Deficits / Details: limited ankle ROM (unable to achieve neutral),limited ROM at knee and hip, maintains with hip external rotation and knee flexion. limited by body habitus, poor  strength relative to body weight LLE Sensation: WNL    Cervical / Trunk Assessment Cervical / Trunk Assessment: Other exceptions Cervical / Trunk Exceptions: large body habitus  Communication   Communication Communication: No apparent difficulties Cueing Techniques: Verbal cues  Cognition Arousal: Alert Behavior During Therapy: WFL for tasks assessed/performed Overall Cognitive Status: Within Functional Limits for tasks assessed                                          General Comments General comments (skin integrity, edema, etc.): VSS on 4LO2    Exercises General Exercises - Lower Extremity Quad Sets: AROM, Both, 5 reps, Supine Heel Slides: AROM, Both, 5 reps, Supine Straight Leg Raises: AROM, Both, 5 reps, Supine Other Exercises Other Exercises: transverse abdominal activation x5   Assessment/Plan    PT Assessment Patient needs continued PT services  PT Problem List Decreased strength;Decreased range of motion;Decreased activity tolerance;Decreased mobility;Decreased balance       PT Treatment Interventions Therapeutic activities;Therapeutic exercise;Balance training    PT Goals (Current goals can be found in the Care Plan section)  Acute Rehab PT Goals Patient Stated Goal: return to standing and walking PT Goal Formulation: With patient Time For Goal Achievement: 01/29/23 Potential to Achieve Goals: Fair    Frequency Min 1X/week        AM-PAC PT "6 Clicks" Mobility  Outcome Measure Help needed turning from your back to your side while in a flat bed without using bedrails?: A Lot Help needed moving from lying on your back to sitting on the side of a flat bed without using bedrails?: A Lot Help needed moving to and from a bed to a chair (including a wheelchair)?: Total Help needed standing up from a chair using your arms (e.g., wheelchair or bedside chair)?: Total Help needed to walk in hospital room?: Total Help needed climbing 3-5 steps  with a railing? : Total 6 Click Score: 8    End of Session   Activity Tolerance: Patient limited by fatigue Patient left: in bed;with call bell/phone within reach Nurse Communication: Mobility status;Need for lift equipment PT Visit Diagnosis: Muscle weakness (generalized) (M62.81)    Time: 8469-6295 PT Time Calculation (min) (ACUTE ONLY): 31 min   Charges:   PT Evaluation $PT Eval Moderate Complexity: 1 Mod PT Treatments $Therapeutic Activity: 8-22 mins PT General Charges $$ ACUTE PT VISIT: 1 Visit         Vickki Muff, PT, DPT   Acute Rehabilitation Department Office [phone] Secure Chat Communication Preferred  Ronnie Derby 01/15/2023, 5:46 PM

## 2023-01-16 DIAGNOSIS — J9621 Acute and chronic respiratory failure with hypoxia: Secondary | ICD-10-CM | POA: Diagnosis not present

## 2023-01-16 DIAGNOSIS — I5033 Acute on chronic diastolic (congestive) heart failure: Secondary | ICD-10-CM | POA: Diagnosis not present

## 2023-01-16 DIAGNOSIS — N179 Acute kidney failure, unspecified: Secondary | ICD-10-CM | POA: Diagnosis not present

## 2023-01-16 LAB — CBC
HCT: 47.3 % — ABNORMAL HIGH (ref 36.0–46.0)
Hemoglobin: 13.1 g/dL (ref 12.0–15.0)
MCH: 22.9 pg — ABNORMAL LOW (ref 26.0–34.0)
MCHC: 27.7 g/dL — ABNORMAL LOW (ref 30.0–36.0)
MCV: 82.8 fL (ref 80.0–100.0)
Platelets: 330 10*3/uL (ref 150–400)
RBC: 5.71 MIL/uL — ABNORMAL HIGH (ref 3.87–5.11)
RDW: 21.3 % — ABNORMAL HIGH (ref 11.5–15.5)
WBC: 5.7 10*3/uL (ref 4.0–10.5)
nRBC: 0 % (ref 0.0–0.2)

## 2023-01-16 LAB — RENAL FUNCTION PANEL
Albumin: 3.3 g/dL — ABNORMAL LOW (ref 3.5–5.0)
Anion gap: 7 (ref 5–15)
BUN: 49 mg/dL — ABNORMAL HIGH (ref 8–23)
CO2: 29 mmol/L (ref 22–32)
Calcium: 9.3 mg/dL (ref 8.9–10.3)
Chloride: 102 mmol/L (ref 98–111)
Creatinine, Ser: 2.37 mg/dL — ABNORMAL HIGH (ref 0.44–1.00)
GFR, Estimated: 22 mL/min — ABNORMAL LOW (ref >60)
Glucose, Bld: 161 mg/dL — ABNORMAL HIGH (ref 70–99)
Phosphorus: 4.2 mg/dL (ref 2.5–4.6)
Potassium: 4.4 mmol/L (ref 3.5–5.1)
Sodium: 138 mmol/L (ref 135–145)

## 2023-01-16 LAB — GLUCOSE, CAPILLARY
Glucose-Capillary: 146 mg/dL — ABNORMAL HIGH (ref 70–99)
Glucose-Capillary: 201 mg/dL — ABNORMAL HIGH (ref 70–99)
Glucose-Capillary: 202 mg/dL — ABNORMAL HIGH (ref 70–99)
Glucose-Capillary: 144 mg/dL — ABNORMAL HIGH (ref 70–99)

## 2023-01-16 NOTE — Progress Notes (Signed)
Heart Failure Navigator Progress Note  Assessed for Heart & Vascular TOC clinic readiness.  Patient does not meet criteria due to EF 55-60%, per MD note patient bed bound. No HF TOC.   Navigator will sign off at this time.   Rhae Hammock, BSN, Scientist, clinical (histocompatibility and immunogenetics) Only

## 2023-01-16 NOTE — Progress Notes (Signed)
   01/16/23 1041  Spiritual Encounters  Type of Visit Initial  Care provided to: Patient  Reason for visit Advance directives  OnCall Visit No   Chaplain responded to Spiritual Consult requesting Advnced Care Directive.  Chaplain delivered the education on the documents and explained to Pt how to reach out with any questions, and how to move forward with the document.  Pt requested that chaplain leave the documents with her and she will discuss them with her son when he visits tonight.  Chaplain services remain available by Spiritual Consult or for emergent cases, paging [phone]  Chaplain Raelene Bott, MDiv Mar[place] [email or web address] [phone]

## 2023-01-16 NOTE — Progress Notes (Signed)
Rounding Note    Patient Name: GLADYCE WELLENS Date of Encounter: 01/16/2023  [facility] Cardiologist: Dr Jenene Slicker  Subjective   No dyspnea; no CP  Inpatient Medications    Scheduled Meds:  allopurinol  50 mg Oral QODAY   atorvastatin  10 mg Oral QHS   escitalopram  10 mg Oral q morning   heparin  5,000 Units Subcutaneous Q8H   insulin aspart  0-5 Units Subcutaneous QHS   insulin aspart  0-9 Units Subcutaneous TID WC   insulin aspart  3 Units Subcutaneous TID WC   insulin glargine-yfgn  24 Units Subcutaneous QHS   loratadine  10 mg Oral Daily   midodrine  10 mg Oral TID WC   mirabegron ER  25 mg Oral Daily   nystatin   Topical BID   pantoprazole  40 mg Oral QAC breakfast   pregabalin  50 mg Oral BID   sodium chloride flush  3 mL Intravenous Q12H   Continuous Infusions:  PRN Meds: acetaminophen, hydrOXYzine, ipratropium-albuterol, melatonin, mirtazapine, ondansetron **OR** ondansetron (ZOFRAN) IV, oxyCODONE, senna-docusate   Vital Signs    Vitals:   01/15/23 2119 01/16/23 0040 01/16/23 0533 01/16/23 0619  BP: 111/66  (!) 140/84   Pulse: 73  76   Resp: 16  16   Temp: 98 F (36.7 C)  98 F (36.7 C)   TempSrc: Axillary  Oral   SpO2: 97% 96% 94%   Weight:    (!) 138.8 kg  Height:        Intake/Output Summary (Last 24 hours) at 01/16/2023 0811 Last data filed at 01/15/2023 2145 Gross per 24 hour  Intake 211.96 ml  Output --  Net 211.96 ml      01/16/2023    6:19 AM 01/15/2023    2:57 AM 01/14/2023    9:01 AM  Last 3 Weights  Weight (lbs) 306 lb 297 lb 2.9 oz 296 lb 4.8 oz  Weight (kg) 138.8 kg 134.8 kg 134.4 kg      Telemetry    Sinus - Personally Reviewed   Physical Exam   GEN: No acute distress.  Morbidly obese. Neck: Difficult to assess JVP Cardiac: RRR, no murmurs, rubs, or gallops.  Respiratory: Clear to auscultation bilaterally. GI: Soft, nontender, non-distended  MS: Trace to 1+ edema Neuro:  Nonfocal  Psych:  Normal affect   Labs    High Sensitivity Troponin:   Recent Labs  Lab 01/13/23 1811 01/13/23 2000  TROPONINIHS 9 9     Chemistry Recent Labs  Lab 01/13/23 1811 01/14/23 0340 01/15/23 0357 01/16/23 0302  NA 139 139 139 138  K 4.5 4.8 4.6 4.4  CL 100 102 98 102  CO2 27 22 28 29   GLUCOSE 178* 156* 163* 161*  BUN 52* 53* 54* 49*  CREATININE 2.47* 2.62* 2.96* 2.37*  CALCIUM 9.3 9.5 8.9 9.3  PROT 8.0  --   --   --   ALBUMIN 3.1*  --  2.8* 3.3*  AST 13*  --   --   --   ALT 11  --   --   --   ALKPHOS 147*  --   --   --   BILITOT 0.7  --   --   --   GFRNONAA 21* 19* 17* 22*  ANIONGAP 12 15 13 7      Hematology Recent Labs  Lab 01/14/23 0340 01/15/23 0357 01/16/23 0302  WBC 5.9 5.7 5.7  RBC 7.00* 5.83* 5.71*  HGB  15.9* 13.2 13.1  HCT 59.0* 48.3* 47.3*  MCV 84.3 82.8 82.8  MCH 22.7* 22.6* 22.9*  MCHC 26.9* 27.3* 27.7*  RDW 22.9* 21.9* 21.3*  PLT 313 330 330    BNP Recent Labs  Lab 01/13/23 1811  BNP 334.2*      Radiology    ECHOCARDIOGRAM COMPLETE  Result Date: 01/15/2023    ECHOCARDIOGRAM REPORT   Patient Name:   YMANI THAEMERT Date of Exam: 01/15/2023 Medical Rec #:  [id number]          Height:       62.5 in Accession #:    [id number]         Weight:       297.2 lb Date of Birth:  Jul 09, 1955          BSA:          2.276 m Patient Age:    67 years           BP:           101/55 mmHg Patient Gender: F                  HR:           82 bpm. Exam Location:  Inpatient Procedure: 2D Echo, Color Doppler and Cardiac Doppler Indications:    Diastolic Heart Failure  History:        Patient has prior history of Echocardiogram examinations, most                 recent 09/05/2021. CHF, CKD; Risk Factors:Sleep Apnea, Diabetes,                 Morbid Obesity and Dyslipidemia.  Sonographer:    Milbert Coulter Referring Phys: [id number] VISHAL R PATEL  Sonographer Comments: Technically challenging study due to limited acoustic windows, Technically difficult study due to poor echo  windows, no apical window and patient is obese. Image acquisition challenging due to patient body habitus, Image acquisition  challenging due to uncooperative patient and Patient refused apical imaging due to pain. IMPRESSIONS  1. Left ventricular ejection fraction, by estimation, is 55 to 60%. The left ventricle has normal function. The left ventricle has no regional wall motion abnormalities. There is moderate concentric left ventricular hypertrophy. Left ventricular diastolic parameters are indeterminate.  2. Right ventricular systolic function was not well visualized. The right ventricular size is not well visualized.  3. The mitral valve is normal in structure. No evidence of mitral valve regurgitation. No evidence of mitral stenosis.  4. The aortic valve is normal in structure. Aortic valve regurgitation is not visualized. Aortic valve sclerosis is present, with no evidence of aortic valve stenosis.  5. The inferior vena cava is normal in size with greater than 50% respiratory variability, suggesting right atrial pressure of 3 mmHg. FINDINGS  Left Ventricle: Left ventricular ejection fraction, by estimation, is 55 to 60%. The left ventricle has normal function. The left ventricle has no regional wall motion abnormalities. The left ventricular internal cavity size was normal in size. There is  moderate concentric left ventricular hypertrophy. Left ventricular diastolic parameters are indeterminate. Right Ventricle: The right ventricular size is not well visualized. Right vetricular wall thickness was not assessed. Right ventricular systolic function was not well visualized. Left Atrium: Left atrial size was not assessed. Right Atrium: Right atrial size was not assessed. Pericardium: There is no evidence of pericardial effusion. Presence of epicardial fat layer. Mitral Valve: The mitral valve is  normal in structure. No evidence of mitral valve regurgitation. No evidence of mitral valve stenosis. Tricuspid Valve:  The tricuspid valve is normal in structure. Tricuspid valve regurgitation is not demonstrated. No evidence of tricuspid stenosis. Aortic Valve: The aortic valve is normal in structure. Aortic valve regurgitation is not visualized. Aortic valve sclerosis is present, with no evidence of aortic valve stenosis. Pulmonic Valve: The pulmonic valve was normal in structure. Pulmonic valve regurgitation is not visualized. No evidence of pulmonic stenosis. Aorta: The aortic root is normal in size and structure. Venous: The inferior vena cava is normal in size with greater than 50% respiratory variability, suggesting right atrial pressure of 3 mmHg. IAS/Shunts: No atrial level shunt detected by color flow Doppler.  LEFT VENTRICLE PLAX 2D LVIDd:         4.00 cm LVIDs:         2.80 cm LV PW:         1.40 cm LV IVS:        1.20 cm LVOT diam:     1.80 cm LVOT Area:     2.54 cm  LEFT ATRIUM         Index LA diam:    4.30 cm 1.89 cm/m   AORTA Ao Root diam: 2.70 cm Ao Asc diam:  2.80 cm TRICUSPID VALVE TR Peak grad:   22.3 mmHg TR Vmax:        236.00 cm/s  SHUNTS Systemic Diam: 1.80 cm Kardie Tobb DO Electronically signed by Thomasene Ripple DO Signature Date/Time: 01/15/2023/4:16:43 PM    Final       Patient Profile     67 y.o. female with past medical history of chronic respiratory failure on home O2, chronic kidney disease, diabetes mellitus, hyperlipidemia, obstructive sleep apnea for evaluation of acute on chronic diastolic congestive heart failure.  Echocardiogram this admission shows normal LV function, moderate left ventricular hypertrophy, RV not well-visualized.  Assessment & Plan    1 acute on chronic diastolic congestive heart failure-volume status is very difficult to assess due to obesity.  She states her dyspnea is improved compared to admission.  Will continue to hold diuretics for now.  Will resume Demadex at home dose at discharge.  2 chronic respiratory failure with history of obstructive sleep  apnea/obesity hypoventilation syndrome-patient has been noncompliant with CPAP in the past.  She has been reinitiated.  3 chronic stage IIIb kidney disease-continue to follow renal function.  4 morbid obesity-needs weight loss.  For questions or updates, please contact [facility] Please consult [email or web address] for contact info under        Signed, Olga Millers, MD  01/16/2023, 8:11 AM

## 2023-01-16 NOTE — Progress Notes (Signed)
   01/15/23 2315  BiPAP/CPAP/SIPAP  $ Non-Invasive Ventilator  Non-Invasive Vent Subsequent  $ Face Mask Large  Yes  BiPAP/CPAP/SIPAP Pt Type Adult  BiPAP/CPAP/SIPAP SERVO  Reason BIPAP/CPAP not in use  (pt said bipap is not comfortable but will try to wear it for a litle bit.)  Mask Type Full face mask  Mask Size Large  Set Rate 16 breaths/min  Respiratory Rate 22 breaths/min  IPAP 16 cmH20  EPAP 6 cmH2O  PEEP 6 cmH20  FiO2 (%) 40 %  Minute Ventilation 14.2  Leak 36  Peak Inspiratory Pressure (PIP) 16  Tidal Volume (Vt) 482  Patient Home Equipment No  Auto Titrate No  Press High Alarm 35 cmH2O

## 2023-01-16 NOTE — Progress Notes (Signed)
   01/16/23 2038  BiPAP/CPAP/SIPAP  $ Non-Invasive Home Ventilator  Subsequent  BiPAP/CPAP/SIPAP Pt Type Adult  BiPAP/CPAP/SIPAP Resmed  Mask Type Nasal pillows  Mask Size Medium  PEEP 14 cmH20  Flow Rate 5 lpm  Patient Home Equipment Yes  CPAP/SIPAP surface wiped down Yes  Safety Check Completed by RT for Home Unit Yes, no issues noted  BiPAP/CPAP /SiPAP Vitals  Bilateral Breath Sounds Clear;Diminished

## 2023-01-16 NOTE — TOC Progression Note (Addendum)
Transition of Care [facility]) - Progression Note    Patient Details  Name: [name] [name] MRN: [id number] Date of Birth: 01-10-1956  [facility] CM/SW Contact  Huston Foley Jacklynn Ganong, RN Phone Number: 01/16/2023, 1:09 PM  Clinical Narrative:     Patient is 67 yr old female from home,  admitted with acute/chronic heart failure. Prior to admission patient has been bedbound, OOB with hoyer and Aide assistance. Has sit to stand recliner and motorized wheelchair. Patient's goal is to get more independent and able to get out of the house. Discussed need for Home Health PT/OT, patient states she had been receiving services with [facility], requested a different agency. Case Manager contacted her nurse at [facility]- Denny Peon [phone] and relayed conversation, Denny Peon stated to go ahead with referral to [facility] and she will follow with patient. CM called Lorenza Chick, [facility] Liaison with referral. Patient will need PTAR transport home when ready for discharge.   01/16/23 1333: Case Manager [facility] with [facility] that patient has a BIPAP at home, and she does, Erin delivered it to patient. Per MD patient will discharge home on 01/17/23,  Medical Necessity form has been completed and PTAR will be scheduled tomorrow morning for transport home.     Expected Discharge Plan: Home w Home Health Services Barriers to Discharge: Continued Medical Work up  Expected Discharge Plan and Services   Discharge Planning Services: CM Consult Post Acute Care Choice: Home Health Living arrangements for the past 2 months: Apartment                 DME Arranged: N/A DME Agency: NA       HH Arranged: PT, OT HH Agency: [facility] Date [facility] Agency Contacted: 01/16/23 Time HH Agency Contacted: 1229 Representative spoke with at [facility] Agency: Lorenza Chick   Social Determinants of Health (SDOH) Interventions SDOH Screenings   Food Insecurity: No Food Insecurity (01/13/2023)   Housing: Low Risk  (01/13/2023)  Transportation Needs: No Transportation Needs (01/13/2023)  Utilities: Not At Risk (01/13/2023)  Financial Resource Strain: Patient Declined (05/16/2022)   Received from [facility]  Social Connections: Unknown (05/13/2022)   Received from [facility]  Stress: No Stress Concern Present (05/16/2022)   Received from [facility]  Tobacco Use: Medium Risk (01/14/2023)    Readmission Risk Interventions    10/07/2022   11:37 AM 09/28/2021   11:23 AM 09/08/2021   10:20 AM  Readmission Risk Prevention Plan  Transportation Screening Complete Complete Complete  PCP or Specialist Appt within 3-5 Days Complete --   Home Care Screening   Complete  Medication Review (RN CM)   Complete  HRI or Home Care Consult Complete Complete   Social Work Consult for Recovery Care Planning/Counseling Complete Complete   Palliative Care Screening Not Applicable Not Applicable   Medication Review Oceanographer) Complete Complete

## 2023-01-16 NOTE — Progress Notes (Signed)
Placed patient on bipap for the night

## 2023-01-16 NOTE — Progress Notes (Signed)
[facility]                                                                              [name] [name], is a 67 y.o. female, DOB - 1955-10-30, ZOX:[id number] Admit date - 01/13/2023    Outpatient Primary MD for the patient is [facility], [name]  LOS - 3  days  Chief Complaint  Patient presents with   Shortness of Breath       Brief summary   Patient is a 67 year old female with chronic HFpEF (EF 60-65% 08/2021), chronic hypoxia on 4 L O2 [place] (nonadherent), CKD stage IIIb, insulin-dependent T2DM, HLD, functional quadriplegia, OSA/OHS nonadherent to CPAP who presented to the ED for evaluation of shortness of breath.  Patient receives most of medical care through the [place] system. She says she is nonambulatory and is essentially bedbound.  Patient reported that for the last week she has been having worsening shortness of breath, occurring while at rest and worse on laying flat with increased swelling in both of her legs compared to baseline. She reports decreased urine output than expected.  No fever chills, chest pain.   Chest x-ray showed chronic cardiomegaly without focal consolidation or effusion. Creatinine 2.47. BNP 334.2, troponin 9  Cardiology consulted.  Patient admitted for further workup  Assessment & Plan    Principal Problem:   Acute on chronic heart failure with preserved ejection fraction (HFpEF) (HCC) -Presented with increasing shortness of breath, peripheral edema, BNP 315.  Previous echo in 08/2021 with a EF of 60 to 65%. -received Lasix 40 mg IV x 1 on 11/22 -Creatinine worsened to 2.9 on 11/24, diuretics on hold, improving to 2.3 today -2D echo showed EF of 55 to 60%, no regional WMA, moderate concentric LVH, diastolic parameters indeterminate -Per patient,  overall feels better, no acute shortness of breath.  Cardiology following  Active problems  Acute kidney injury superimposed on CKD stage IIIb: -Creatinine on admission is 2.47,  previously 1.63 on 10/07/2022.  [facility] in [facility] creatinine was 2.27 on 10/31 and 2.55 on 11/20.  Patient may either have AKI on CKD secondary to volume overload versus progression toward CKD stage IV. -Renal function worse to 2.9 on 11/24, received IV albumin -Creatinine improving to 2.3 today.  -UA with no UTI, renal ultrasound showed no obstructive uropathy    Acute on Chronic respiratory failure with hypoxia, hypercarbia: -Supposed to wear 4 L O2 via [place] at baseline but admits that she does not use her home O2 regularly.  - SpO2 improved when she is adherent. Also, OSA and obesity hypoventilation, medications (oxycodone, xanax, neurontin, lyrica) -On 11/23 ABG with pH 7.2, pCO2 80, placed on BiPAP -Alert and oriented, continue BiPAP nightly, PRN during naps.   Insulin-dependent type 2 diabetes, uncontrolled with hyperglycemia: -Hemoglobin A1c 9.2 CBG (last 3)  Recent Labs    01/15/23 2117 01/16/23 0745 01/16/23 1200  GLUCAP 181* 146* 201*   -Continue NovoLog 24 units nightly, NovoLog 3 units 3 times daily AC, SSI    Hyperlipidemia: Continue atorvastatin.   OSA/OHS: Not using CPAP regularly.  Will use in hospital nightly.  Morbid obesity with functional quadriplegia Estimated body mass index is 55.08 kg/m as calculated from the following:   Height as of this encounter: 5' 2.5" (1.588 m).   Weight as of this encounter: 138.8 kg. PT evaluation  Code Status: Full code DVT Prophylaxis:  heparin injection 5,000 Units Start: 01/13/23 2200   Level of Care: Level of care: Progressive Family Communication: Updated patient Disposition Plan:      Remains inpatient appropriate: Bedbound.  Needs BiPAP nightly and as needed, will need to verify if she has CPAP at home or BiPAP?  Procedures:    Consultants:   Cardiology  Antimicrobials:   Anti-infectives (From admission, onward)    None          Medications  allopurinol  50 mg Oral QODAY    atorvastatin  10 mg Oral QHS   escitalopram  10 mg Oral q morning   heparin  5,000 Units Subcutaneous Q8H   insulin aspart  0-5 Units Subcutaneous QHS   insulin aspart  0-9 Units Subcutaneous TID WC   insulin aspart  3 Units Subcutaneous TID WC   insulin glargine-yfgn  24 Units Subcutaneous QHS   loratadine  10 mg Oral Daily   midodrine  10 mg Oral TID WC   mirabegron ER  25 mg Oral Daily   nystatin   Topical BID   pantoprazole  40 mg Oral QAC breakfast   pregabalin  50 mg Oral BID   sodium chloride flush  3 mL Intravenous Q12H      Subjective:   [name] [name] was seen and examined today.  Seen this morning, eating breakfast without any difficulty.  Feeling a lot better, close to her baseline.  No acute nausea vomiting chest pain or shortness of breath.    Objective:   Vitals:   01/16/23 0533 01/16/23 0619 01/16/23 0810 01/16/23 1203  BP: (!) 140/84  122/87   Pulse: 76  77   Resp: 16  16   Temp: 98 F (36.7 C)   97.7 F (36.5 C)  TempSrc: Oral   Oral  SpO2: 94%  97%   Weight:  (!) 138.8 kg    Height:        Intake/Output Summary (Last 24 hours) at 01/16/2023 1328 Last data filed at 01/15/2023 2145 Gross per 24 hour  Intake 211.96 ml  Output --  Net 211.96 ml     Wt Readings from Last 3 Encounters:  01/16/23 (!) 138.8 kg  10/02/22 128.4 kg  12/01/21 136.1 kg   Physical Exam General: Alert and oriented x 3, NAD Cardiovascular: S1 S2 clear, RRR.  JVD difficult to assess due to body habitus Respiratory: CTAB, no wheezing Gastrointestinal: Obese, soft, NT, ND, NBS Ext: no pedal edema bilaterally Neuro: no new deficits Psych: Normal affect and pleasant    Data Reviewed:  I have personally reviewed following labs    CBC Lab Results  Component Value Date   WBC 5.7 01/16/2023   RBC 5.71 (H) 01/16/2023   HGB 13.1 01/16/2023   HCT 47.3 (H) 01/16/2023   MCV 82.8 01/16/2023   MCH 22.9 (L) 01/16/2023   PLT 330 01/16/2023   MCHC 27.7 (L) 01/16/2023    RDW 21.3 (H) 01/16/2023   LYMPHSABS 1.8 01/13/2023   MONOABS 0.3 01/13/2023   EOSABS 0.4 01/13/2023   BASOSABS 0.1 01/13/2023     Last metabolic panel Lab Results  Component Value Date   NA 138 01/16/2023   K 4.4 01/16/2023   CL  102 01/16/2023   CO2 29 01/16/2023   BUN 49 (H) 01/16/2023   CREATININE 2.37 (H) 01/16/2023   GLUCOSE 161 (H) 01/16/2023   GFRNONAA 22 (L) 01/16/2023   GFRAA 44 (L) 11/23/2019   CALCIUM 9.3 01/16/2023   PHOS 4.2 01/16/2023   PROT 8.0 01/13/2023   ALBUMIN 3.3 (L) 01/16/2023   LABGLOB 2.9 06/28/2014   AGRATIO 1.1 06/28/2014   BILITOT 0.7 01/13/2023   ALKPHOS 147 (H) 01/13/2023   AST 13 (L) 01/13/2023   ALT 11 01/13/2023   ANIONGAP 7 01/16/2023    CBG (last 3)  Recent Labs    01/15/23 2117 01/16/23 0745 01/16/23 1200  GLUCAP 181* 146* 201*      Coagulation Profile: No results for input(s): "INR", "PROTIME" in the last 168 hours.   Radiology Studies: I have personally reviewed the imaging studies  ECHOCARDIOGRAM COMPLETE  Result Date: 01/15/2023    ECHOCARDIOGRAM REPORT   Patient Name:   [name] [name] Date of Exam: 01/15/2023 Medical Rec #:  [id number]          Height:       62.5 in Accession #:    [id number]         Weight:       297.2 lb Date of Birth:  06-11-1955          BSA:          2.276 m Patient Age:    67 years           BP:           101/55 mmHg Patient Gender: F                  HR:           82 bpm. Exam Location:  Inpatient Procedure: 2D Echo, Color Doppler and Cardiac Doppler Indications:    Diastolic Heart Failure  History:        Patient has prior history of Echocardiogram examinations, most                 recent 09/05/2021. CHF, CKD; Risk Factors:Sleep Apnea, Diabetes,                 Morbid Obesity and Dyslipidemia.  Sonographer:    Milbert Coulter Referring Phys: [id number] VISHAL R PATEL  Sonographer Comments: Technically challenging study due to limited acoustic windows, Technically difficult study due to poor echo windows,  no apical window and patient is obese. Image acquisition challenging due to patient body habitus, Image acquisition  challenging due to uncooperative patient and Patient refused apical imaging due to pain. IMPRESSIONS  1. Left ventricular ejection fraction, by estimation, is 55 to 60%. The left ventricle has normal function. The left ventricle has no regional wall motion abnormalities. There is moderate concentric left ventricular hypertrophy. Left ventricular diastolic parameters are indeterminate.  2. Right ventricular systolic function was not well visualized. The right ventricular size is not well visualized.  3. The mitral valve is normal in structure. No evidence of mitral valve regurgitation. No evidence of mitral stenosis.  4. The aortic valve is normal in structure. Aortic valve regurgitation is not visualized. Aortic valve sclerosis is present, with no evidence of aortic valve stenosis.  5. The inferior vena cava is normal in size with greater than 50% respiratory variability, suggesting right atrial pressure of 3 mmHg. FINDINGS  Left Ventricle: Left ventricular ejection fraction, by estimation, is 55 to 60%. The left ventricle has normal function.  The left ventricle has no regional wall motion abnormalities. The left ventricular internal cavity size was normal in size. There is  moderate concentric left ventricular hypertrophy. Left ventricular diastolic parameters are indeterminate. Right Ventricle: The right ventricular size is not well visualized. Right vetricular wall thickness was not assessed. Right ventricular systolic function was not well visualized. Left Atrium: Left atrial size was not assessed. Right Atrium: Right atrial size was not assessed. Pericardium: There is no evidence of pericardial effusion. Presence of epicardial fat layer. Mitral Valve: The mitral valve is normal in structure. No evidence of mitral valve regurgitation. No evidence of mitral valve stenosis. Tricuspid Valve: The  tricuspid valve is normal in structure. Tricuspid valve regurgitation is not demonstrated. No evidence of tricuspid stenosis. Aortic Valve: The aortic valve is normal in structure. Aortic valve regurgitation is not visualized. Aortic valve sclerosis is present, with no evidence of aortic valve stenosis. Pulmonic Valve: The pulmonic valve was normal in structure. Pulmonic valve regurgitation is not visualized. No evidence of pulmonic stenosis. Aorta: The aortic root is normal in size and structure. Venous: The inferior vena cava is normal in size with greater than 50% respiratory variability, suggesting right atrial pressure of 3 mmHg. IAS/Shunts: No atrial level shunt detected by color flow Doppler.  LEFT VENTRICLE PLAX 2D LVIDd:         4.00 cm LVIDs:         2.80 cm LV PW:         1.40 cm LV IVS:        1.20 cm LVOT diam:     1.80 cm LVOT Area:     2.54 cm  LEFT ATRIUM         Index LA diam:    4.30 cm 1.89 cm/m   AORTA Ao Root diam: 2.70 cm Ao Asc diam:  2.80 cm TRICUSPID VALVE TR Peak grad:   22.3 mmHg TR Vmax:        236.00 cm/s  SHUNTS Systemic Diam: 1.80 cm Kardie Tobb DO Electronically signed by Thomasene Ripple DO Signature Date/Time: 01/15/2023/4:16:43 PM    Final        Thad Ranger M.D. [facility] 01/16/2023, 1:28 PM  Available via Epic secure chat 7am-7pm After 7 pm, please refer to night coverage provider listed on amion.

## 2023-01-17 DIAGNOSIS — I130 Hypertensive heart and chronic kidney disease with heart failure and stage 1 through stage 4 chronic kidney disease, or unspecified chronic kidney disease: Secondary | ICD-10-CM | POA: Diagnosis not present

## 2023-01-17 DIAGNOSIS — I5033 Acute on chronic diastolic (congestive) heart failure: Secondary | ICD-10-CM | POA: Diagnosis not present

## 2023-01-17 DIAGNOSIS — N1832 Chronic kidney disease, stage 3b: Secondary | ICD-10-CM

## 2023-01-17 DIAGNOSIS — N179 Acute kidney failure, unspecified: Secondary | ICD-10-CM | POA: Diagnosis not present

## 2023-01-17 DIAGNOSIS — E1122 Type 2 diabetes mellitus with diabetic chronic kidney disease: Secondary | ICD-10-CM | POA: Diagnosis not present

## 2023-01-17 DIAGNOSIS — J9621 Acute and chronic respiratory failure with hypoxia: Secondary | ICD-10-CM | POA: Diagnosis not present

## 2023-01-17 LAB — RENAL FUNCTION PANEL
Albumin: 3.3 g/dL — ABNORMAL LOW (ref 3.5–5.0)
Anion gap: 12 (ref 5–15)
BUN: 46 mg/dL — ABNORMAL HIGH (ref 8–23)
CO2: 27 mmol/L (ref 22–32)
Calcium: 9.8 mg/dL (ref 8.9–10.3)
Chloride: 101 mmol/L (ref 98–111)
Creatinine, Ser: 1.92 mg/dL — ABNORMAL HIGH (ref 0.44–1.00)
GFR, Estimated: 28 mL/min — ABNORMAL LOW (ref >60)
Glucose, Bld: 119 mg/dL — ABNORMAL HIGH (ref 70–99)
Phosphorus: 3.3 mg/dL (ref 2.5–4.6)
Potassium: 4.7 mmol/L (ref 3.5–5.1)
Sodium: 140 mmol/L (ref 135–145)

## 2023-01-17 LAB — GLUCOSE, CAPILLARY
Glucose-Capillary: 130 mg/dL — ABNORMAL HIGH (ref 70–99)
Glucose-Capillary: 189 mg/dL — ABNORMAL HIGH (ref 70–99)

## 2023-01-17 MED ORDER — POTASSIUM CHLORIDE CRYS ER 20 MEQ PO TBCR: 20.0000 meq | EXTENDED_RELEASE_TABLET | Freq: Every day | ORAL | 2 refills | Status: DC

## 2023-01-17 MED ORDER — INFLUENZA VAC A&B SURF ANT ADJ 0.5 ML IM SUSY
0.5000 mL | PREFILLED_SYRINGE | INTRAMUSCULAR | Status: DC
  Administered 2023-01-17: 0.5 mL via INTRAMUSCULAR

## 2023-01-17 MED ORDER — POLYETHYLENE GLYCOL 3350 17 G PO PACK: 17.0000 g | PACK | Freq: Every day | ORAL | 0 refills | Status: DC

## 2023-01-17 MED ORDER — MIDODRINE HCL 5 MG PO TABS
5.0000 mg | ORAL_TABLET | Freq: Three times a day (TID) | ORAL | Status: DC
  Administered 2023-01-17: 5 mg via ORAL
  Filled 2023-01-17: qty 1

## 2023-01-17 MED ORDER — ONDANSETRON HCL 4 MG PO TABS: 4.0000 mg | ORAL_TABLET | Freq: Three times a day (TID) | ORAL | 0 refills | Status: DC | PRN

## 2023-01-17 MED ORDER — INSULIN GLARGINE-YFGN 100 UNIT/ML ~~LOC~~ SOPN: 58.0000 [IU] | PEN_INJECTOR | Freq: Every day | SUBCUTANEOUS | Status: DC

## 2023-01-17 MED ORDER — PREGABALIN 50 MG PO CAPS: 50.0000 mg | ORAL_CAPSULE | Freq: Two times a day (BID) | ORAL | 2 refills | Status: DC

## 2023-01-17 MED ORDER — INFLUENZA VAC A&B SURF ANT ADJ 0.5 ML IM SUSY: 0.5000 mL | PREFILLED_SYRINGE | INTRAMUSCULAR | Status: DC

## 2023-01-17 MED ORDER — TORSEMIDE 20 MG PO TABS: 20.0000 mg | ORAL_TABLET | Freq: Every day | ORAL | Status: DC

## 2023-01-17 MED ORDER — MIDODRINE HCL 5 MG PO TABS: 5.0000 mg | ORAL_TABLET | Freq: Three times a day (TID) | ORAL | 0 refills | Status: DC

## 2023-01-17 NOTE — Progress Notes (Signed)
Rounding Note    Patient Name: [name] [name] Date of Encounter: 01/17/2023  [facility] Cardiologist: Dr Jenene Slicker  Subjective   Denies CP or dyspnea  Inpatient Medications    Scheduled Meds:  allopurinol  50 mg Oral QODAY   atorvastatin  10 mg Oral QHS   escitalopram  10 mg Oral q morning   heparin  5,000 Units Subcutaneous Q8H   insulin aspart  0-5 Units Subcutaneous QHS   insulin aspart  0-9 Units Subcutaneous TID WC   insulin aspart  3 Units Subcutaneous TID WC   insulin glargine-yfgn  24 Units Subcutaneous QHS   loratadine  10 mg Oral Daily   midodrine  10 mg Oral TID WC   mirabegron ER  25 mg Oral Daily   nystatin   Topical BID   pantoprazole  40 mg Oral QAC breakfast   pregabalin  50 mg Oral BID   sodium chloride flush  3 mL Intravenous Q12H   Continuous Infusions:  PRN Meds: acetaminophen, hydrOXYzine, ipratropium-albuterol, melatonin, mirtazapine, ondansetron **OR** ondansetron (ZOFRAN) IV, oxyCODONE, senna-docusate   Vital Signs    Vitals:   01/16/23 2356 01/17/23 0448 01/17/23 0500 01/17/23 0519  BP: (!) 114/58   (!) 144/71  Pulse: 74 82 82 81  Resp: 15 16 (!) 21 20  Temp: 98 F (36.7 C)   98 F (36.7 C)  TempSrc: Oral   Oral  SpO2: 90% 95% 91% 97%  Weight:   (!) 137.5 kg   Height:        Intake/Output Summary (Last 24 hours) at 01/17/2023 0735 Last data filed at 01/17/2023 0547 Gross per 24 hour  Intake --  Output 500 ml  Net -500 ml      01/17/2023    5:00 AM 01/16/2023    6:19 AM 01/15/2023    2:57 AM  Last 3 Weights  Weight (lbs) 303 lb 2.1 oz 306 lb 297 lb 2.9 oz  Weight (kg) 137.5 kg 138.8 kg 134.8 kg      Telemetry    Sinus - Personally Reviewed   Physical Exam   GEN: NAD.  Morbidly obese. Neck: supple Cardiac: RRR, no rub Respiratory: CTA anteriorly GI: Soft, obese MS: Trace to 1+ edema Neuro:  Grossly intact Psych: Normal affect   Labs    High Sensitivity Troponin:   Recent Labs  Lab  01/13/23 1811 01/13/23 2000  TROPONINIHS 9 9     Chemistry Recent Labs  Lab 01/13/23 1811 01/14/23 0340 01/15/23 0357 01/16/23 0302 01/17/23 0420  NA 139   < > 139 138 140  K 4.5   < > 4.6 4.4 4.7  CL 100   < > 98 102 101  CO2 27   < > 28 29 27   GLUCOSE 178*   < > 163* 161* 119*  BUN 52*   < > 54* 49* 46*  CREATININE 2.47*   < > 2.96* 2.37* 1.92*  CALCIUM 9.3   < > 8.9 9.3 9.8  PROT 8.0  --   --   --   --   ALBUMIN 3.1*  --  2.8* 3.3* 3.3*  AST 13*  --   --   --   --   ALT 11  --   --   --   --   ALKPHOS 147*  --   --   --   --   BILITOT 0.7  --   --   --   --  GFRNONAA 21*   < > 17* 22* 28*  ANIONGAP 12   < > 13 7 12    < > = values in this interval not displayed.     Hematology Recent Labs  Lab 01/14/23 0340 01/15/23 0357 01/16/23 0302  WBC 5.9 5.7 5.7  RBC 7.00* 5.83* 5.71*  HGB 15.9* 13.2 13.1  HCT 59.0* 48.3* 47.3*  MCV 84.3 82.8 82.8  MCH 22.7* 22.6* 22.9*  MCHC 26.9* 27.3* 27.7*  RDW 22.9* 21.9* 21.3*  PLT 313 330 330    BNP Recent Labs  Lab 01/13/23 1811  BNP 334.2*      Radiology    ECHOCARDIOGRAM COMPLETE  Result Date: 01/15/2023    ECHOCARDIOGRAM REPORT   Patient Name:   [name] [name] Date of Exam: 01/15/2023 Medical Rec #:  [id number]          Height:       62.5 in Accession #:    [id number]         Weight:       297.2 lb Date of Birth:  1955/05/23          BSA:          2.276 m Patient Age:    67 years           BP:           101/55 mmHg Patient Gender: F                  HR:           82 bpm. Exam Location:  Inpatient Procedure: 2D Echo, Color Doppler and Cardiac Doppler Indications:    Diastolic Heart Failure  History:        Patient has prior history of Echocardiogram examinations, most                 recent 09/05/2021. CHF, CKD; Risk Factors:Sleep Apnea, Diabetes,                 Morbid Obesity and Dyslipidemia.  Sonographer:    Milbert Coulter Referring Phys: [id number] VISHAL R PATEL  Sonographer Comments: Technically challenging study  due to limited acoustic windows, Technically difficult study due to poor echo windows, no apical window and patient is obese. Image acquisition challenging due to patient body habitus, Image acquisition  challenging due to uncooperative patient and Patient refused apical imaging due to pain. IMPRESSIONS  1. Left ventricular ejection fraction, by estimation, is 55 to 60%. The left ventricle has normal function. The left ventricle has no regional wall motion abnormalities. There is moderate concentric left ventricular hypertrophy. Left ventricular diastolic parameters are indeterminate.  2. Right ventricular systolic function was not well visualized. The right ventricular size is not well visualized.  3. The mitral valve is normal in structure. No evidence of mitral valve regurgitation. No evidence of mitral stenosis.  4. The aortic valve is normal in structure. Aortic valve regurgitation is not visualized. Aortic valve sclerosis is present, with no evidence of aortic valve stenosis.  5. The inferior vena cava is normal in size with greater than 50% respiratory variability, suggesting right atrial pressure of 3 mmHg. FINDINGS  Left Ventricle: Left ventricular ejection fraction, by estimation, is 55 to 60%. The left ventricle has normal function. The left ventricle has no regional wall motion abnormalities. The left ventricular internal cavity size was normal in size. There is  moderate concentric left ventricular hypertrophy. Left ventricular diastolic parameters are indeterminate. Right Ventricle: The  right ventricular size is not well visualized. Right vetricular wall thickness was not assessed. Right ventricular systolic function was not well visualized. Left Atrium: Left atrial size was not assessed. Right Atrium: Right atrial size was not assessed. Pericardium: There is no evidence of pericardial effusion. Presence of epicardial fat layer. Mitral Valve: The mitral valve is normal in structure. No evidence of  mitral valve regurgitation. No evidence of mitral valve stenosis. Tricuspid Valve: The tricuspid valve is normal in structure. Tricuspid valve regurgitation is not demonstrated. No evidence of tricuspid stenosis. Aortic Valve: The aortic valve is normal in structure. Aortic valve regurgitation is not visualized. Aortic valve sclerosis is present, with no evidence of aortic valve stenosis. Pulmonic Valve: The pulmonic valve was normal in structure. Pulmonic valve regurgitation is not visualized. No evidence of pulmonic stenosis. Aorta: The aortic root is normal in size and structure. Venous: The inferior vena cava is normal in size with greater than 50% respiratory variability, suggesting right atrial pressure of 3 mmHg. IAS/Shunts: No atrial level shunt detected by color flow Doppler.  LEFT VENTRICLE PLAX 2D LVIDd:         4.00 cm LVIDs:         2.80 cm LV PW:         1.40 cm LV IVS:        1.20 cm LVOT diam:     1.80 cm LVOT Area:     2.54 cm  LEFT ATRIUM         Index LA diam:    4.30 cm 1.89 cm/m   AORTA Ao Root diam: 2.70 cm Ao Asc diam:  2.80 cm TRICUSPID VALVE TR Peak grad:   22.3 mmHg TR Vmax:        236.00 cm/s  SHUNTS Systemic Diam: 1.80 cm Kardie Tobb DO Electronically signed by Thomasene Ripple DO Signature Date/Time: 01/15/2023/4:16:43 PM    Final       Patient Profile     67 y.o. female with past medical history of chronic respiratory failure on home O2, chronic kidney disease, diabetes mellitus, hyperlipidemia, obstructive sleep apnea for evaluation of acute on chronic diastolic congestive heart failure.  Echocardiogram this admission shows normal LV function, moderate left ventricular hypertrophy, RV not well-visualized.  Assessment & Plan    1 acute on chronic diastolic congestive heart failure-as outlined previously volume status is difficult to evaluate due to her obesity.  However her symptoms are much improved.  Renal function with some improvement but not back to baseline.  Will hold  diuretics today and would resume Demadex 20 mg daily and K-Dur 20 mEq daily tomorrow.   2 chronic respiratory failure with history of obstructive sleep apnea/obesity hypoventilation syndrome-she is back on CPAP.  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  4 morbid obesity-needs weight loss.  Could likely be discharged from a cardiac standpoint.  Resume Demadex and K-Dur tomorrow as outlined above.  Will check potassium and renal function 1 week following discharge.  Needs close follow-up at the [place] [facility] [place] where she receives her care.  For questions or updates, please contact [facility] Please consult [email or web address] for contact info under        Signed, Olga Millers, MD  01/17/2023, 7:35 AM

## 2023-01-17 NOTE — TOC Transition Note (Signed)
Transition of Care [facility]) - CM/SW Discharge Note   Patient Details  Name: TAVONNA RULLI MRN: [id number] Date of Birth: 17-Apr-1955  Transition of Care [facility]) CM/SW Contact:  Durenda Guthrie, RN Phone Number: 01/17/2023, 11:47 AM   Clinical Narrative:    Patient being discharged today, Case Manager has arranged for [facility] transport for 12:30p or shortly after.   Bedside RN and patient updated.    Final next level of care: Home w Home Health Services Barriers to Discharge: No Barriers Identified   Patient Goals and CMS Choice   Choice offered to / list presented to : Patient  Discharge Placement                         Discharge Plan and Services Additional resources added to the After Visit Summary for     Discharge Planning Services: CM Consult Post Acute Care Choice: Home Health          DME Arranged: N/A DME Agency: NA       HH Arranged: PT, OT HH Agency: [facility] Date [facility] Agency Contacted: 01/16/23 Time HH Agency Contacted: 1229 Representative spoke with at [facility] Agency: Lorenza Chick  Social Determinants of Health (SDOH) Interventions SDOH Screenings   Food Insecurity: No Food Insecurity (01/13/2023)  Housing: Low Risk  (01/13/2023)  Transportation Needs: No Transportation Needs (01/13/2023)  Utilities: Not At Risk (01/13/2023)  Financial Resource Strain: Patient Declined (05/16/2022)   Received from [facility]  Social Connections: Unknown (05/13/2022)   Received from [facility]  Stress: No Stress Concern Present (05/16/2022)   Received from [facility]  Tobacco Use: Medium Risk (01/14/2023)     Readmission Risk Interventions    10/07/2022   11:37 AM 09/28/2021   11:23 AM 09/08/2021   10:20 AM  Readmission Risk Prevention Plan  Transportation Screening Complete Complete Complete  PCP or Specialist Appt within 3-5 Days Complete --   Home Care Screening   Complete  Medication Review (RN CM)   Complete  HRI or Home Care  Consult Complete Complete   Social Work Consult for Recovery Care Planning/Counseling Complete Complete   Palliative Care Screening Not Applicable Not Applicable   Medication Review Oceanographer) Complete Complete

## 2023-01-17 NOTE — Progress Notes (Signed)
Physical Therapy Treatment Patient Details Name: [name] [name] MRN: [id number] DOB: October 21, 1955 Today's Date: 01/17/2023   History of Present Illness The pt is a 67 yo female presenting 11/22 with SOB. PMH includes: HFrEF, chronic hypoxia on 4L (nonadherent), CKD III, DM II, HLD, OSA/OHS, class III obesity (BMI >40).    PT Comments  Pt reports she is going home today and will get OT this afternoon, but is happy to demonstrate her LE exercises that she does at least once daily. Pt reports she is at her baseline level of function in terms of bed mobility as she was able to use bedrail to assist in rolling for pericare earlier this morning.    If plan is discharge home, recommend the following: Two people to help with walking and/or transfers;A lot of help with bathing/dressing/bathroom;Assistance with cooking/housework   Can travel by private vehicle      No   Equipment Recommendations  None recommended by PT       Precautions / Restrictions Precautions Precautions: Fall Precaution Comments: non-ambulatory for "years" Restrictions Weight Bearing Restrictions: No     Mobility  Bed Mobility Overal bed mobility: Needs Assistance Bed Mobility: Rolling Rolling: Used rails, Supervision         General bed mobility comments: pt able to roll in bed with use of rails for pericare    Transfers                   General transfer comment: discharging home this afternoon    Ambulation/Gait               General Gait Details: pt non-ambulatory at baseline         Cognition Arousal: Alert Behavior During Therapy: WFL for tasks assessed/performed Overall Cognitive Status: Within Functional Limits for tasks assessed                                          Exercises General Exercises - Lower Extremity Ankle Circles/Pumps: AROM, Both, 5 reps (x5 bouts) Quad Sets: AROM, Both, 5 reps, Supine Gluteal Sets: AROM, Both, 5 reps (x 5 bouts) Heel  Slides: AROM, Both, 5 reps, Supine Straight Leg Raises: AROM, Both, 5 reps, Supine    General Comments General comments (skin integrity, edema, etc.): VSS on 4L O2 via [place]      Pertinent Vitals/Pain Pain Assessment Pain Assessment: No/denies pain     PT Goals (current goals can now be found in the care plan section) Acute Rehab PT Goals Patient Stated Goal: return to standing and walking PT Goal Formulation: With patient Time For Goal Achievement: 01/29/23 Potential to Achieve Goals: Fair Progress towards PT goals: Progressing toward goals    Frequency    Min 1X/week       AM-PAC PT "6 Clicks" Mobility   Outcome Measure  Help needed turning from your back to your side while in a flat bed without using bedrails?: A Lot Help needed moving from lying on your back to sitting on the side of a flat bed without using bedrails?: A Lot Help needed moving to and from a bed to a chair (including a wheelchair)?: Total Help needed standing up from a chair using your arms (e.g., wheelchair or bedside chair)?: Total Help needed to walk in hospital room?: Total Help needed climbing 3-5 steps with a railing? : Total 6 Click Score: 8  End of Session   Activity Tolerance: Patient limited by fatigue Patient left: in bed;with call bell/phone within reach Nurse Communication: Mobility status;Need for lift equipment PT Visit Diagnosis: Muscle weakness (generalized) (M62.81)     Time: 1205-1219 PT Time Calculation (min) (ACUTE ONLY): 14 min  Charges:    $Therapeutic Exercise: 8-22 mins PT General Charges $$ ACUTE PT VISIT: 1 Visit                     Keyontay Stolz B. Beverely Risen PT, DPT Acute Rehabilitation Services Please use secure chat or  Call Office [phone]    Elon Alas Fleet 01/17/2023, 1:20 PM

## 2023-01-17 NOTE — Discharge Summary (Addendum)
Physician Discharge Summary   Patient: [name] [name] MRN: [id number] DOB: September 06, 1955  Admit date:     01/13/2023  Discharge date: 01/17/23  Discharge Physician: Thad Ranger, MD    PCP: [name]   Recommendations at discharge:   Neurontin discontinued, Lyrica decreased to 50 mg twice daily Recommended decrease oxycodone outpatient (patient receives medications from [place]) Strongly recommended BiPAP every night and as needed during the labs.  Patient reports that she has BiPAP at home. Resume Demadex 20 mg daily tomorrow Will need follow-up appointment and labs checked in 1 week at the [facility]  Discharge Diagnoses:   Acute on chronic hypoxic and hypercarbic respiratory failure   Acute on chronic heart failure with preserved ejection fraction (HFpEF) (HCC)   AKI (acute kidney injury) (HCC) on CKD stage IIIb   Insulin dependent type 2 diabetes mellitus (HCC)   Morbid obesity (HCC)   OSA (obstructive sleep apnea)   Functional quadriplegia (HCC)/wheelchair/bedbound status    Hospital Course:  Patient is a 67 year old female with chronic HFpEF (EF 60-65% 08/2021), chronic hypoxia on 4 L O2 [place] (nonadherent), CKD stage IIIb, insulin-dependent T2DM, HLD, functional quadriplegia, OSA/OHS nonadherent to CPAP who presented to the ED for evaluation of shortness of breath.  Patient receives most of medical care through the [place] system. She says she is nonambulatory and is essentially bedbound.  Patient reported that for the last week she has been having worsening shortness of breath, occurring while at rest and worse on laying flat with increased swelling in both of her legs compared to baseline. She reports decreased urine output than expected.  No fever chills, chest pain.   Chest x-ray showed chronic cardiomegaly without focal consolidation or effusion. Creatinine 2.47. BNP 334.2, troponin 9   Cardiology was consulted.  Patient admitted for further workup   Assessment and  Plan:  Acute on chronic heart failure with preserved ejection fraction (HFpEF) (HCC) -Presented with increasing shortness of breath, peripheral edema, BNP 315.  Previous echo in 08/2021 with a EF of 60 to 65%. -received Lasix 40 mg IV x 1 on 11/22, creatinine worsened to 2.9 and diuretics were placed on hold. -2D echo showed EF of 55 to 60%, no regional WMA, moderate concentric LVH, diastolic parameters indeterminate -Per patient,  overall feels better, no acute shortness of breath, wants to go home. -Creatinine function improving, 1.9 today -Per cardiology, resumed Demadex 20 mg daily tomorrow 01/18/2023.  Cleared by cardiology to discharge home.     Acute kidney injury superimposed on CKD stage IIIb: -Creatinine on admission is 2.47, previously 1.63 on 10/07/2022.  [facility] creatinine was 2.27 on 10/31 and 2.55 on 11/20.  Patient may either have AKI on CKD secondary to volume overload versus progression toward CKD stage IV. -Renal function worse to 2.9 on 11/24, received IV albumin -UA with no UTI, renal ultrasound showed no obstructive uropathy  -Creatinine improved to 1.9 today, close to baseline   Acute on Chronic respiratory failure with hypoxia, hypercarbia: -Supposed to wear 4 L O2 via [place] at baseline but admits that she does not use her home O2 regularly.  - SpO2 improved when she is adherent. Also, OSA and obesity hypoventilation, medications (oxycodone, xanax, neurontin, lyrica) -On 11/23 ABG with pH 7.2, pCO2 80, was placed on BiPAP -Alert and oriented, continue BiPAP nightly, PRN during naps.   Insulin-dependent type 2 diabetes, uncontrolled with hyperglycemia: -Hemoglobin A1c 9.2 -Continue Semglee, semaglutide outpatient.  Defer to PCP for  further adjustment of meds.    Hyperlipidemia: Continue atorvastatin.   OSA/OHS: -Per patient (and confirmed by TOC), she has BiPAP at home however has not been consistently using. -Counseled strongly on  using the BiPAP every night and during naps   Morbid obesity with functional quadriplegia Estimated body mass index is 55.08 kg/m as calculated from the following:   Height as of this encounter: 5' 2.5" (1.588 m).   Weight as of this encounter: 138.8 kg. PT evaluation       Pain control - [place] Controlled Substance Reporting System database was reviewed. and patient was instructed, not to drive, operate heavy machinery, perform activities at heights, swimming or participation in water activities or provide baby-sitting services while on Pain, Sleep and Anxiety Medications; until their outpatient Physician has advised to do so again. Also recommended to not to take more than prescribed Pain, Sleep and Anxiety Medications.  Consultants: Pulmonology, cardiology Procedures performed: 2D echo Disposition: Home Diet recommendation: Carb modified diet  DISCHARGE MEDICATION: Allergies as of 01/17/2023       Reactions   Lisinopril Swelling   Morphine Itching, Rash   Penicillins Hives, Itching, Nausea And Vomiting, Rash   Has patient had a PCN reaction causing immediate rash, facial/tongue/throat swelling, SOB or lightheadedness with hypotension: No Has patient had a PCN reaction causing severe rash involving mucus membranes or skin necrosis: No Has patient had a PCN reaction that required hospitalization: No Has patient had a PCN reaction occurring within the last 10 years: No If all of the above answers are "NO", then may proceed with Cephalosporin use.        Medication List     STOP taking these medications    gabapentin 300 MG capsule Commonly known as: NEURONTIN       TAKE these medications    acetaminophen 500 MG tablet Commonly known as: TYLENOL Take 1,500 mg by mouth in the morning and at bedtime.   albuterol 108 (90 Base) MCG/ACT inhaler Commonly known as: VENTOLIN HFA Inhale 2 puffs into the lungs every 6 (six) hours as needed for wheezing or shortness  of breath.   allopurinol 100 MG tablet Commonly known as: ZYLOPRIM Take 50 mg by mouth every other day.   atorvastatin 10 MG tablet Commonly known as: LIPITOR Take 10 mg by mouth at bedtime.   carbamide peroxide 6.5 % OTIC solution Commonly known as: DEBROX Place 5-10 drops into the left ear 2 (two) times daily.   cetirizine 10 MG tablet Commonly known as: ZYRTEC Take 10 mg by mouth daily.   Cholecalciferol 25 MCG (1000 UT) Tbdp Take 1 tablet by mouth daily.   desonide 0.05 % cream Commonly known as: DESOWEN Apply 1 Application topically 2 (two) times daily. For 2 weeks per month.   DIMETHICONE (TOPICAL) 5 % Crea Apply 1 Application topically daily as needed.   escitalopram 10 MG tablet Commonly known as: LEXAPRO Take 1 tablet (10 mg total) by mouth every morning.   glucose 4 GM chewable tablet Chew 4 tablets by mouth See admin instructions. As needed (repeat every if blood sugar is less than 70)   hydrocortisone cream 1 % Apply 1 Application topically 2 (two) times daily.   hydrOXYzine 25 MG capsule Commonly known as: VISTARIL Take 25 mg by mouth 2 (two) times daily.   insulin glargine-yfgn 100 UNIT/ML Pen Commonly known as: SEMGLEE Inject 58 Units into the skin daily.   ketotifen 0.035 % ophthalmic solution Commonly known as: ZADITOR  Place 1 drop into both eyes 2 (two) times daily as needed (allergies).   melatonin 3 MG Tabs tablet Take 3 mg by mouth at bedtime as needed (sleep).   midodrine 5 MG tablet Commonly known as: PROAMATINE Take 1 tablet (5 mg total) by mouth 3 (three) times daily with meals.   mirabegron ER 25 MG Tb24 tablet Commonly known as: MYRBETRIQ Take 25 mg by mouth daily.   mirtazapine 15 MG tablet Commonly known as: REMERON Take 15 mg by mouth at bedtime as needed (sleep).   naloxone 4 MG/0.1ML Liqd nasal spray kit Commonly known as: NARCAN Place 1 spray into the nose once. SPRAY 1 SPRAY INTO ONE NOSTRIL AS DIRECTED FOR  OPIOID OVERDOSE - CALL 911 IMMEDIATELY, ADMINISTER DOSE, THEN TURN PERSON ON SIDE - IF NO RESPONSE IN 2-3 MINUTES OR PERSON RESPONDS BUT RELAPSES, REPEAT USING A NEW SPRAY DEVICE AND SPRAY INTO THE OTHER NOSTRIL   nystatin cream Commonly known as: MYCOSTATIN Apply 1 Application topically 2 (two) times daily. What changed: Another medication with the same name was changed. Make sure you understand how and when to take each.   Nyamyc powder Generic drug: nystatin Apply to affected area 2 (two) times daily to groin and areas of fungal infection What changed: additional instructions   ondansetron 4 MG tablet Commonly known as: ZOFRAN Take 1 tablet (4 mg total) by mouth every 8 (eight) hours as needed for nausea or vomiting.   Oxycodone HCl 10 MG Tabs Take 10 mg by mouth 2 (two) times daily as needed (pain).   pantoprazole 40 MG tablet Commonly known as: PROTONIX Take 40 mg by mouth daily before breakfast.   polyethylene glycol 17 g packet Commonly known as: MiraLax Take 17 g by mouth daily. Also available over-the-counter   Polyethylene Glycol 3350 Powd Take 17 g by mouth daily as needed (constipation).   potassium chloride SA 20 MEQ tablet Commonly known as: KLOR-CON M Take 1 tablet (20 mEq total) by mouth daily. Start taking on: January 18, 2023   pregabalin 50 MG capsule Commonly known as: LYRICA Take 1 capsule (50 mg total) by mouth 2 (two) times daily. What changed:  medication strength how much to take   Semaglutide (2 MG/DOSE) 8 MG/3ML Sopn Inject 2 mg into the skin once a week.   torsemide 20 MG tablet Commonly known as: DEMADEX Take 1 tablet (20 mg total) by mouth daily. Start taking on: January 18, 2023   Unifine Pentips 31G X 5 MM Misc Generic drug: Insulin Pen Needle Use as directed daily   Vitamin A & D Oint Apply 1 application  topically daily as needed (skin care).        Follow-up Information     [facility] Schedule an  appointment as soon as possible for a visit in 2 week(s).   Why: for hospital follow-up, obtain labs BMET Contact information: [address] [facility] [place] [address] [phone]                Discharge Exam: Filed Weights   01/15/23 0257 01/16/23 0619 01/17/23 0500  Weight: 134.8 kg (!) 138.8 kg (!) 137.5 kg   S: No acute complaints, wants to go home.  No chest pain or acute shortness of breath, cleared by cardiology to discharge home.  BP (!) 148/80 (BP Location: Right Arm)   Pulse 74   Temp 97.9 F (36.6 C) (Oral)   Resp 18   Ht 5' 2.5" (1.588 m)  Wt (!) 137.5 kg   SpO2 95%   BMI 54.56 kg/m   Physical Exam General: Alert and oriented x 3, NAD Cardiovascular: S1 S2 clear, RRR.  JVD difficult to assess due to body habitus Respiratory: CTAB, no wheezing, rales or rhonchi Gastrointestinal: Soft, nontender, nondistended, NBS Ext: no pedal edema bilaterally Neuro: no new deficits Psych: Normal affect    Condition at discharge: fair  The results of significant diagnostics from this hospitalization (including imaging, microbiology, ancillary and laboratory) are listed below for reference.   Imaging Studies: ECHOCARDIOGRAM COMPLETE  Result Date: 01/15/2023    ECHOCARDIOGRAM REPORT   Patient Name:   TSURUKO COITO Date of Exam: 01/15/2023 Medical Rec #:  [id number]          Height:       62.5 in Accession #:    [id number]         Weight:       297.2 lb Date of Birth:  [birth year]          BSA:          2.276 m Patient Age:    67 years           BP:           101/55 mmHg Patient Gender: F                  HR:           82 bpm. Exam Location:  Inpatient Procedure: 2D Echo, Color Doppler and Cardiac Doppler Indications:    Diastolic Heart Failure  History:        Patient has prior history of Echocardiogram examinations, most                 recent 09/05/2021. CHF, CKD; Risk Factors:Sleep Apnea, Diabetes,                 Morbid Obesity and Dyslipidemia.   Sonographer:    Milbert Coulter Referring Phys: [id number] VISHAL R PATEL  Sonographer Comments: Technically challenging study due to limited acoustic windows, Technically difficult study due to poor echo windows, no apical window and patient is obese. Image acquisition challenging due to patient body habitus, Image acquisition  challenging due to uncooperative patient and Patient refused apical imaging due to pain. IMPRESSIONS  1. Left ventricular ejection fraction, by estimation, is 55 to 60%. The left ventricle has normal function. The left ventricle has no regional wall motion abnormalities. There is moderate concentric left ventricular hypertrophy. Left ventricular diastolic parameters are indeterminate.  2. Right ventricular systolic function was not well visualized. The right ventricular size is not well visualized.  3. The mitral valve is normal in structure. No evidence of mitral valve regurgitation. No evidence of mitral stenosis.  4. The aortic valve is normal in structure. Aortic valve regurgitation is not visualized. Aortic valve sclerosis is present, with no evidence of aortic valve stenosis.  5. The inferior vena cava is normal in size with greater than 50% respiratory variability, suggesting right atrial pressure of 3 mmHg. FINDINGS  Left Ventricle: Left ventricular ejection fraction, by estimation, is 55 to 60%. The left ventricle has normal function. The left ventricle has no regional wall motion abnormalities. The left ventricular internal cavity size was normal in size. There is  moderate concentric left ventricular hypertrophy. Left ventricular diastolic parameters are indeterminate. Right Ventricle: The right ventricular size is not well visualized. Right vetricular wall thickness was not assessed. Right ventricular systolic function was  not well visualized. Left Atrium: Left atrial size was not assessed. Right Atrium: Right atrial size was not assessed. Pericardium: There is no evidence of  pericardial effusion. Presence of epicardial fat layer. Mitral Valve: The mitral valve is normal in structure. No evidence of mitral valve regurgitation. No evidence of mitral valve stenosis. Tricuspid Valve: The tricuspid valve is normal in structure. Tricuspid valve regurgitation is not demonstrated. No evidence of tricuspid stenosis. Aortic Valve: The aortic valve is normal in structure. Aortic valve regurgitation is not visualized. Aortic valve sclerosis is present, with no evidence of aortic valve stenosis. Pulmonic Valve: The pulmonic valve was normal in structure. Pulmonic valve regurgitation is not visualized. No evidence of pulmonic stenosis. Aorta: The aortic root is normal in size and structure. Venous: The inferior vena cava is normal in size with greater than 50% respiratory variability, suggesting right atrial pressure of 3 mmHg. IAS/Shunts: No atrial level shunt detected by color flow Doppler.  LEFT VENTRICLE PLAX 2D LVIDd:         4.00 cm LVIDs:         2.80 cm LV PW:         1.40 cm LV IVS:        1.20 cm LVOT diam:     1.80 cm LVOT Area:     2.54 cm  LEFT ATRIUM         Index LA diam:    4.30 cm 1.89 cm/m   AORTA Ao Root diam: 2.70 cm Ao Asc diam:  2.80 cm TRICUSPID VALVE TR Peak grad:   22.3 mmHg TR Vmax:        236.00 cm/s  SHUNTS Systemic Diam: 1.80 cm Kardie Tobb DO Electronically signed by Thomasene Ripple DO Signature Date/Time: 01/15/2023/4:16:43 PM    Final    US RENAL  Result Date: 01/14/2023 CLINICAL DATA:  Acute kidney injury EXAM: RENAL / URINARY TRACT ULTRASOUND COMPLETE COMPARISON:  CT chest abdomen and pelvis 10/02/2022 FINDINGS: Right Kidney: Renal measurements: 10.3 x 4.7 x 4.2 cm = volume: 104 mL. Echogenicity within normal limits. No mass or hydronephrosis visualized. Left Kidney: Renal measurements: 9.8 x 4.5 x 4.8 cm = volume: 109 mL. Echogenicity within normal limits. No mass or hydronephrosis visualized. Bladder: Appears normal for degree of bladder distention. Other: None.  IMPRESSION: Normal renal ultrasound. Electronically Signed   By: Darliss Cheney M.D.   On: 01/14/2023 00:47   DG Chest Portable 1 View  Result Date: 01/13/2023 CLINICAL DATA:  Hypoxia. EXAM: PORTABLE CHEST 1 VIEW COMPARISON:  Chest radiograph 09/25/2021, CT 10/02/2022 FINDINGS: Chronic cardiomegaly, unchanged. Minor retrocardiac atelectasis at the left lung base. No confluent airspace disease, pleural effusion, or pneumothorax. No pulmonary edema. Chronic widening of the right acromioclavicular joint IMPRESSION: Chronic cardiomegaly. No acute chest findings. Electronically Signed   By: Narda Rutherford M.D.   On: 01/13/2023 18:50    Microbiology: Results for orders placed or performed during the hospital encounter of 01/13/23  Resp panel by RT-PCR (RSV, Flu A&B, Covid) Anterior Nasal Swab     Status: None   Collection Time: 01/13/23  6:11 PM   Specimen: Anterior Nasal Swab  Result Value Ref Range Status   SARS Coronavirus 2 by RT PCR NEGATIVE NEGATIVE Final   Influenza A by PCR NEGATIVE NEGATIVE Final   Influenza B by PCR NEGATIVE NEGATIVE Final    Comment: (NOTE) The Xpert Xpress SARS-CoV-2/FLU/RSV plus assay is intended as an aid in the diagnosis of influenza from Nasopharyngeal swab specimens and  should not be used as a sole basis for treatment. Nasal washings and aspirates are unacceptable for Xpert Xpress SARS-CoV-2/FLU/RSV testing.  Fact Sheet for Patients: BloggerCourse.com  Fact Sheet for Healthcare Providers: SeriousBroker.it  This test is not yet approved or cleared by the [place] FDA and has been authorized for detection and/or diagnosis of SARS-CoV-2 by FDA under an Emergency Use Authorization (EUA). This EUA will remain in effect (meaning this test can be used) for the duration of the COVID-19 declaration under Section 564(b)(1) of the Act, 21 U.S.C. section 360bbb-3(b)(1), unless the authorization is terminated  or revoked.     Resp Syncytial Virus by PCR NEGATIVE NEGATIVE Final    Comment: (NOTE) Fact Sheet for Patients: BloggerCourse.com  Fact Sheet for Healthcare Providers: SeriousBroker.it  This test is not yet approved or cleared by the [place] FDA and has been authorized for detection and/or diagnosis of SARS-CoV-2 by FDA under an Emergency Use Authorization (EUA). This EUA will remain in effect (meaning this test can be used) for the duration of the COVID-19 declaration under Section 564(b)(1) of the Act, 21 U.S.C. section 360bbb-3(b)(1), unless the authorization is terminated or revoked.  Performed at [facility], [address], [place], [place] [address]     Labs: CBC: Recent Labs  Lab 01/13/23 1811 01/14/23 0340 01/15/23 0357 01/16/23 0302  WBC 5.7 5.9 5.7 5.7  NEUTROABS 3.1  --   --   --   HGB 14.8 15.9* 13.2 13.1  HCT 53.6* 59.0* 48.3* 47.3*  MCV 82.5 84.3 82.8 82.8  PLT 324 313 330 330   Basic Metabolic Panel: Recent Labs  Lab 01/13/23 1811 01/14/23 0340 01/15/23 0357 01/16/23 0302 01/17/23 0420  NA 139 139 139 138 140  K 4.5 4.8 4.6 4.4 4.7  CL 100 102 98 102 101  CO2 27 22 28 29 27   GLUCOSE 178* 156* 163* 161* 119*  BUN 52* 53* 54* 49* 46*  CREATININE 2.47* 2.62* 2.96* 2.37* 1.92*  CALCIUM 9.3 9.5 8.9 9.3 9.8  PHOS  --   --  4.8* 4.2 3.3   Liver Function Tests: Recent Labs  Lab 01/13/23 1811 01/15/23 0357 01/16/23 0302 01/17/23 0420  AST 13*  --   --   --   ALT 11  --   --   --   ALKPHOS 147*  --   --   --   BILITOT 0.7  --   --   --   PROT 8.0  --   --   --   ALBUMIN 3.1* 2.8* 3.3* 3.3*   CBG: Recent Labs  Lab 01/16/23 1200 01/16/23 1636 01/16/23 2212 01/17/23 0808 01/17/23 1158  GLUCAP 201* 202* 144* 130* 189*    Discharge time spent: greater than 30 minutes.  Signed: Thad Ranger, MD [facility] 01/17/2023

## 2023-01-17 NOTE — Plan of Care (Signed)
Problem: Education: Goal: Ability to describe self-care measures that may prevent or decrease complications (Diabetes Survival Skills Education) will improve 01/17/2023 1142 by Darryl Nestle, RN Outcome: Adequate for Discharge 01/17/2023 1142 by Darryl Nestle, RN Outcome: Progressing Goal: Individualized Educational Video(s) 01/17/2023 1142 by Darryl Nestle, RN Outcome: Adequate for Discharge 01/17/2023 1142 by Darryl Nestle, RN Outcome: Progressing   Problem: Education: Goal: Individualized Educational Video(s) 01/17/2023 1142 by Darryl Nestle, RN Outcome: Adequate for Discharge 01/17/2023 1142 by Darryl Nestle, RN Outcome: Progressing   Problem: Coping: Goal: Ability to adjust to condition or change in health will improve 01/17/2023 1142 by Darryl Nestle, RN Outcome: Adequate for Discharge 01/17/2023 1142 by Darryl Nestle, RN Outcome: Progressing   Problem: Fluid Volume: Goal: Ability to maintain a balanced intake and output will improve 01/17/2023 1142 by Darryl Nestle, RN Outcome: Adequate for Discharge 01/17/2023 1142 by Darryl Nestle, RN Outcome: Progressing   Problem: Health Behavior/Discharge Planning: Goal: Ability to identify and utilize available resources and services will improve 01/17/2023 1142 by Darryl Nestle, RN Outcome: Adequate for Discharge 01/17/2023 1142 by Darryl Nestle, RN Outcome: Progressing Goal: Ability to manage health-related needs will improve 01/17/2023 1142 by Darryl Nestle, RN Outcome: Adequate for Discharge 01/17/2023 1142 by Darryl Nestle, RN Outcome: Progressing   Problem: Metabolic: Goal: Ability to maintain appropriate glucose levels will improve 01/17/2023 1142 by Darryl Nestle, RN Outcome: Adequate for Discharge 01/17/2023 1142 by Darryl Nestle, RN Outcome: Progressing   Problem: Nutritional: Goal: Maintenance of adequate nutrition will  improve 01/17/2023 1142 by Darryl Nestle, RN Outcome: Adequate for Discharge 01/17/2023 1142 by Darryl Nestle, RN Outcome: Progressing Goal: Progress toward achieving an optimal weight will improve 01/17/2023 1142 by Darryl Nestle, RN Outcome: Adequate for Discharge 01/17/2023 1142 by Darryl Nestle, RN Outcome: Progressing   Problem: Skin Integrity: Goal: Risk for impaired skin integrity will decrease 01/17/2023 1142 by Darryl Nestle, RN Outcome: Adequate for Discharge 01/17/2023 1142 by Darryl Nestle, RN Outcome: Progressing   Problem: Tissue Perfusion: Goal: Adequacy of tissue perfusion will improve 01/17/2023 1142 by Darryl Nestle, RN Outcome: Adequate for Discharge 01/17/2023 1142 by Darryl Nestle, RN Outcome: Progressing   Problem: Education: Goal: Ability to demonstrate management of disease process will improve 01/17/2023 1142 by Darryl Nestle, RN Outcome: Adequate for Discharge 01/17/2023 1142 by Darryl Nestle, RN Outcome: Progressing Goal: Ability to verbalize understanding of medication therapies will improve 01/17/2023 1142 by Darryl Nestle, RN Outcome: Adequate for Discharge 01/17/2023 1142 by Darryl Nestle, RN Outcome: Progressing Goal: Individualized Educational Video(s) 01/17/2023 1142 by Darryl Nestle, RN Outcome: Adequate for Discharge 01/17/2023 1142 by Darryl Nestle, RN Outcome: Progressing   Problem: Activity: Goal: Capacity to carry out activities will improve 01/17/2023 1142 by Darryl Nestle, RN Outcome: Adequate for Discharge 01/17/2023 1142 by Darryl Nestle, RN Outcome: Progressing   Problem: Cardiac: Goal: Ability to achieve and maintain adequate cardiopulmonary perfusion will improve 01/17/2023 1142 by Darryl Nestle, RN Outcome: Adequate for Discharge 01/17/2023 1142 by Darryl Nestle, RN Outcome: Progressing   Problem: Education: Goal: Knowledge  of General Education information will improve Description: Including pain rating scale, medication(s)/side effects and non-pharmacologic comfort measures 01/17/2023 1142 by Darryl Nestle, RN Outcome: Adequate for Discharge 01/17/2023 1142 by Darryl Nestle, RN Outcome: Progressing   Problem: Health Behavior/Discharge Planning: Goal: Ability to manage health-related needs  will improve 01/17/2023 1142 by Darryl Nestle, RN Outcome: Adequate for Discharge 01/17/2023 1142 by Darryl Nestle, RN Outcome: Progressing   Problem: Clinical Measurements: Goal: Ability to maintain clinical measurements within normal limits will improve 01/17/2023 1142 by Darryl Nestle, RN Outcome: Adequate for Discharge 01/17/2023 1142 by Darryl Nestle, RN Outcome: Progressing Goal: Will remain free from infection 01/17/2023 1142 by Darryl Nestle, RN Outcome: Adequate for Discharge 01/17/2023 1142 by Darryl Nestle, RN Outcome: Progressing Goal: Diagnostic test results will improve 01/17/2023 1142 by Darryl Nestle, RN Outcome: Adequate for Discharge 01/17/2023 1142 by Darryl Nestle, RN Outcome: Progressing Goal: Respiratory complications will improve 01/17/2023 1142 by Darryl Nestle, RN Outcome: Adequate for Discharge 01/17/2023 1142 by Darryl Nestle, RN Outcome: Progressing Goal: Cardiovascular complication will be avoided 01/17/2023 1142 by Darryl Nestle, RN Outcome: Adequate for Discharge 01/17/2023 1142 by Darryl Nestle, RN Outcome: Progressing   Problem: Activity: Goal: Risk for activity intolerance will decrease 01/17/2023 1142 by Darryl Nestle, RN Outcome: Adequate for Discharge 01/17/2023 1142 by Darryl Nestle, RN Outcome: Progressing   Problem: Nutrition: Goal: Adequate nutrition will be maintained 01/17/2023 1142 by Darryl Nestle, RN Outcome: Adequate for Discharge 01/17/2023 1142 by Darryl Nestle,  RN Outcome: Progressing

## 2023-02-15 ENCOUNTER — Encounter (HOSPITAL_COMMUNITY): Payer: Self-pay

## 2023-02-15 ENCOUNTER — Emergency Department (HOSPITAL_COMMUNITY): Payer: Non-veteran care

## 2023-02-15 ENCOUNTER — Inpatient Hospital Stay (HOSPITAL_COMMUNITY)
Admission: EM | Admit: 2023-02-15 | Discharge: 2023-02-23 | DRG: 189 | Disposition: A | Discharge status: Home Health | Payer: Non-veteran care | Attending: Internal Medicine | Admitting: Internal Medicine

## 2023-02-15 ENCOUNTER — Other Ambulatory Visit: Payer: Self-pay

## 2023-02-15 DIAGNOSIS — Z7401 Bed confinement status: Secondary | ICD-10-CM

## 2023-02-15 DIAGNOSIS — Z1152 Encounter for screening for COVID-19: Secondary | ICD-10-CM

## 2023-02-15 DIAGNOSIS — E8770 Fluid overload, unspecified: Secondary | ICD-10-CM

## 2023-02-15 DIAGNOSIS — N179 Acute kidney failure, unspecified: Secondary | ICD-10-CM | POA: Diagnosis present

## 2023-02-15 DIAGNOSIS — R339 Retention of urine, unspecified: Secondary | ICD-10-CM | POA: Diagnosis present

## 2023-02-15 DIAGNOSIS — Z794 Long term (current) use of insulin: Secondary | ICD-10-CM

## 2023-02-15 DIAGNOSIS — Z880 Allergy status to penicillin: Secondary | ICD-10-CM

## 2023-02-15 DIAGNOSIS — E1140 Type 2 diabetes mellitus with diabetic neuropathy, unspecified: Secondary | ICD-10-CM | POA: Diagnosis present

## 2023-02-15 DIAGNOSIS — R0689 Other abnormalities of breathing: Secondary | ICD-10-CM | POA: Diagnosis not present

## 2023-02-15 DIAGNOSIS — Z87891 Personal history of nicotine dependence: Secondary | ICD-10-CM

## 2023-02-15 DIAGNOSIS — Z993 Dependence on wheelchair: Secondary | ICD-10-CM

## 2023-02-15 DIAGNOSIS — G4700 Insomnia, unspecified: Secondary | ICD-10-CM | POA: Diagnosis present

## 2023-02-15 DIAGNOSIS — I5033 Acute on chronic diastolic (congestive) heart failure: Secondary | ICD-10-CM | POA: Diagnosis present

## 2023-02-15 DIAGNOSIS — Z515 Encounter for palliative care: Secondary | ICD-10-CM

## 2023-02-15 DIAGNOSIS — J9601 Acute respiratory failure with hypoxia: Principal | ICD-10-CM | POA: Diagnosis present

## 2023-02-15 DIAGNOSIS — E875 Hyperkalemia: Secondary | ICD-10-CM | POA: Diagnosis present

## 2023-02-15 DIAGNOSIS — G9341 Metabolic encephalopathy: Secondary | ICD-10-CM | POA: Diagnosis present

## 2023-02-15 DIAGNOSIS — N1832 Chronic kidney disease, stage 3b: Secondary | ICD-10-CM

## 2023-02-15 DIAGNOSIS — E11649 Type 2 diabetes mellitus with hypoglycemia without coma: Secondary | ICD-10-CM | POA: Diagnosis not present

## 2023-02-15 DIAGNOSIS — Z888 Allergy status to other drugs, medicaments and biological substances status: Secondary | ICD-10-CM

## 2023-02-15 DIAGNOSIS — Z79899 Other long term (current) drug therapy: Secondary | ICD-10-CM

## 2023-02-15 DIAGNOSIS — Z91199 Patient's noncompliance with other medical treatment and regimen due to unspecified reason: Secondary | ICD-10-CM

## 2023-02-15 DIAGNOSIS — R4182 Altered mental status, unspecified: Principal | ICD-10-CM

## 2023-02-15 DIAGNOSIS — M109 Gout, unspecified: Secondary | ICD-10-CM | POA: Diagnosis present

## 2023-02-15 DIAGNOSIS — Z882 Allergy status to sulfonamides status: Secondary | ICD-10-CM

## 2023-02-15 DIAGNOSIS — J9602 Acute respiratory failure with hypercapnia: Secondary | ICD-10-CM | POA: Diagnosis present

## 2023-02-15 DIAGNOSIS — Z992 Dependence on renal dialysis: Secondary | ICD-10-CM

## 2023-02-15 DIAGNOSIS — I132 Hypertensive heart and chronic kidney disease with heart failure and with stage 5 chronic kidney disease, or end stage renal disease: Secondary | ICD-10-CM | POA: Diagnosis present

## 2023-02-15 DIAGNOSIS — E662 Morbid (severe) obesity with alveolar hypoventilation: Secondary | ICD-10-CM | POA: Diagnosis present

## 2023-02-15 DIAGNOSIS — Z6841 Body Mass Index (BMI) 40.0 and over, adult: Secondary | ICD-10-CM

## 2023-02-15 DIAGNOSIS — Z833 Family history of diabetes mellitus: Secondary | ICD-10-CM

## 2023-02-15 DIAGNOSIS — F32A Depression, unspecified: Secondary | ICD-10-CM | POA: Diagnosis present

## 2023-02-15 DIAGNOSIS — N186 End stage renal disease: Secondary | ICD-10-CM | POA: Diagnosis present

## 2023-02-15 DIAGNOSIS — Z885 Allergy status to narcotic agent status: Secondary | ICD-10-CM

## 2023-02-15 DIAGNOSIS — E1122 Type 2 diabetes mellitus with diabetic chronic kidney disease: Secondary | ICD-10-CM | POA: Diagnosis present

## 2023-02-15 DIAGNOSIS — Z7985 Long-term (current) use of injectable non-insulin antidiabetic drugs: Secondary | ICD-10-CM

## 2023-02-15 DIAGNOSIS — R532 Functional quadriplegia: Secondary | ICD-10-CM | POA: Diagnosis present

## 2023-02-15 DIAGNOSIS — E785 Hyperlipidemia, unspecified: Secondary | ICD-10-CM | POA: Diagnosis present

## 2023-02-15 LAB — CBC WITH DIFFERENTIAL/PLATELET
Abs Immature Granulocytes: 0.05 10*3/uL (ref 0.00–0.07)
Basophils Absolute: 0 10*3/uL (ref 0.0–0.1)
Basophils Relative: 0 %
Eosinophils Absolute: 0 10*3/uL (ref 0.0–0.5)
Eosinophils Relative: 0 %
HCT: 55.2 % — ABNORMAL HIGH (ref 36.0–46.0)
Hemoglobin: 15.1 g/dL — ABNORMAL HIGH (ref 12.0–15.0)
Immature Granulocytes: 1 %
Lymphocytes Relative: 16 %
Lymphs Abs: 0.8 10*3/uL (ref 0.7–4.0)
MCH: 22.9 pg — ABNORMAL LOW (ref 26.0–34.0)
MCHC: 27.4 g/dL — ABNORMAL LOW (ref 30.0–36.0)
MCV: 83.9 fL (ref 80.0–100.0)
Monocytes Absolute: 0.4 10*3/uL (ref 0.1–1.0)
Monocytes Relative: 8 %
Neutro Abs: 3.7 10*3/uL (ref 1.7–7.7)
Neutrophils Relative %: 75 %
Platelets: 280 10*3/uL (ref 150–400)
RBC: 6.58 MIL/uL — ABNORMAL HIGH (ref 3.87–5.11)
RDW: 22.5 % — ABNORMAL HIGH (ref 11.5–15.5)
WBC: 4.9 10*3/uL (ref 4.0–10.5)
nRBC: 1.8 % — ABNORMAL HIGH (ref 0.0–0.2)

## 2023-02-15 LAB — COMPREHENSIVE METABOLIC PANEL
ALT: 42 U/L (ref 0–44)
AST: 32 U/L (ref 15–41)
Albumin: 3.8 g/dL (ref 3.5–5.0)
Alkaline Phosphatase: 210 U/L — ABNORMAL HIGH (ref 38–126)
Anion gap: 19 — ABNORMAL HIGH (ref 5–15)
BUN: 100 mg/dL — ABNORMAL HIGH (ref 8–23)
CO2: 23 mmol/L (ref 22–32)
Calcium: 9 mg/dL (ref 8.9–10.3)
Chloride: 95 mmol/L — ABNORMAL LOW (ref 98–111)
Creatinine, Ser: 5.55 mg/dL — ABNORMAL HIGH (ref 0.44–1.00)
GFR, Estimated: 8 mL/min — ABNORMAL LOW (ref >60)
Glucose, Bld: 73 mg/dL (ref 70–99)
Potassium: 6.2 mmol/L — ABNORMAL HIGH (ref 3.5–5.1)
Sodium: 137 mmol/L (ref 135–145)
Total Bilirubin: 1.5 mg/dL — ABNORMAL HIGH (ref <1.2)
Total Protein: 8.3 g/dL — ABNORMAL HIGH (ref 6.5–8.1)

## 2023-02-15 LAB — I-STAT ARTERIAL BLOOD GAS, ED
Acid-Base Excess: 0 mmol/L (ref 0.0–2.0)
Bicarbonate: 32.5 mmol/L — ABNORMAL HIGH (ref 20.0–28.0)
Calcium, Ion: 1.2 mmol/L (ref 1.15–1.40)
HCT: 53 % — ABNORMAL HIGH (ref 36.0–46.0)
Hemoglobin: 18 g/dL — ABNORMAL HIGH (ref 12.0–15.0)
O2 Saturation: 89 %
Patient temperature: 97.4
Potassium: 5.3 mmol/L — ABNORMAL HIGH (ref 3.5–5.1)
Sodium: 136 mmol/L (ref 135–145)
TCO2: 35 mmol/L — ABNORMAL HIGH (ref 22–32)
pCO2 arterial: 85.3 mm[Hg] (ref 32–48)
pH, Arterial: 7.185 — CL (ref 7.35–7.45)
pO2, Arterial: 71 mm[Hg] — ABNORMAL LOW (ref 83–108)

## 2023-02-15 LAB — I-STAT VENOUS BLOOD GAS, ED
Acid-Base Excess: 2 mmol/L (ref 0.0–2.0)
Bicarbonate: 34.8 mmol/L — ABNORMAL HIGH (ref 20.0–28.0)
Calcium, Ion: 1.01 mmol/L — ABNORMAL LOW (ref 1.15–1.40)
HCT: 57 % — ABNORMAL HIGH (ref 36.0–46.0)
Hemoglobin: 19.4 g/dL — ABNORMAL HIGH (ref 12.0–15.0)
O2 Saturation: 57 %
Potassium: 7.8 mmol/L (ref 3.5–5.1)
Sodium: 134 mmol/L — ABNORMAL LOW (ref 135–145)
TCO2: 38 mmol/L — ABNORMAL HIGH (ref 22–32)
pCO2, Ven: 91.6 mm[Hg] (ref 44–60)
pH, Ven: 7.188 — CL (ref 7.25–7.43)
pO2, Ven: 39 mm[Hg] (ref 32–45)

## 2023-02-15 LAB — TROPONIN I (HIGH SENSITIVITY)
Troponin I (High Sensitivity): 17 ng/L (ref <18)
Troponin I (High Sensitivity): 21 ng/L — ABNORMAL HIGH (ref <18)

## 2023-02-15 LAB — CBG MONITORING, ED
Glucose-Capillary: 18 mg/dL — CL (ref 70–99)
Glucose-Capillary: 68 mg/dL — ABNORMAL LOW (ref 70–99)

## 2023-02-15 LAB — LACTIC ACID, PLASMA: Lactic Acid, Venous: 1.2 mmol/L (ref 0.5–1.9)

## 2023-02-15 LAB — BRAIN NATRIURETIC PEPTIDE: B Natriuretic Peptide: 432.5 pg/mL — ABNORMAL HIGH (ref 0.0–100.0)

## 2023-02-15 MED ORDER — DEXTROSE 50 % IV SOLN
INTRAVENOUS | Status: AC
  Filled 2023-02-15: qty 50

## 2023-02-15 NOTE — ED Notes (Signed)
Pt refused Nasal Swab

## 2023-02-15 NOTE — ED Provider Notes (Signed)
[place] EMERGENCY DEPARTMENT AT [facility] Provider Note   CSN: [id number] Arrival date & time: 02/15/23  1918     History  Chief Complaint  Patient presents with   Altered Mental Status    Patient to ED via EMS with complaint of altered mental status after family called 911 and reported patient had poor responsiveness and disorientation. EMS reports 35% o2patient was on non re-breather placed by [facility]. EMS placed patient on O2 6 liters got o2 up to 98%. Patient is non compliant with o2 use.    [name] [name] is a 67 y.o. female.  The history is provided by the patient and medical records. No language interpreter was used.  Altered Mental Status Presenting symptoms: confusion and partial responsiveness   Severity:  Severe Episode history:  Continuous Timing:  Constant Progression:  Improving Associated symptoms: no abdominal pain, no headaches, no nausea, no rash and no vomiting        Home Medications Prior to Admission medications   Medication Sig Start Date End Date Taking? Authorizing Provider  acetaminophen (TYLENOL) 500 MG tablet Take 1,500 mg by mouth in the morning and at bedtime. 07/24/20   [provider]  albuterol (VENTOLIN HFA) 108 (90 Base) MCG/ACT inhaler Inhale 2 puffs into the lungs every 6 (six) hours as needed for wheezing or shortness of breath. 08/06/20   Glade Lloyd, MD  allopurinol (ZYLOPRIM) 100 MG tablet Take 50 mg by mouth every other day. 12/08/22   [provider]  atorvastatin (LIPITOR) 10 MG tablet Take 10 mg by mouth at bedtime.    [provider]  carbamide peroxide (DEBROX) 6.5 % OTIC solution Place 5-10 drops into the left ear 2 (two) times daily. 08/22/22   [provider]  cetirizine (ZYRTEC) 10 MG tablet Take 10 mg by mouth daily. 11/20/20   [provider]  Cholecalciferol 25 MCG (1000 UT) TBDP Take 1 tablet by mouth daily. 09/19/22   [provider]  desonide (DESOWEN)  0.05 % cream Apply 1 Application topically 2 (two) times daily. For 2 weeks per month. 09/16/22   [provider]  DIMETHICONE, TOPICAL, 5 % CREA Apply 1 Application topically daily as needed. 04/08/22   [provider]  escitalopram (LEXAPRO) 10 MG tablet Take 1 tablet (10 mg total) by mouth every morning. 08/01/21   Reed, Tiffany L, DO  glucose 4 GM chewable tablet Chew 4 tablets by mouth See admin instructions. As needed (repeat every if blood sugar is less than 70) 04/22/22   [provider]  hydrocortisone cream 1 % Apply 1 Application topically 2 (two) times daily. 08/08/22   [provider]  hydrOXYzine (VISTARIL) 25 MG capsule Take 25 mg by mouth 2 (two) times daily. 01/27/22   [provider]  insulin glargine-yfgn (SEMGLEE) 100 UNIT/ML Pen Inject 58 Units into the skin daily. 01/17/23   Rai, Delene Ruffini, MD  Insulin Pen Needle 31G X 5 MM MISC Use as directed daily 02/03/21   Noralee Stain, DO  ketotifen (ZADITOR) 0.035 % ophthalmic solution Place 1 drop into both eyes 2 (two) times daily as needed (allergies). 12/16/22   [provider]  melatonin 3 MG TABS tablet Take 3 mg by mouth at bedtime as needed (sleep). 11/22/22   [provider]  midodrine (PROAMATINE) 5 MG tablet Take 1 tablet (5 mg total) by mouth 3 (three) times daily with meals. 01/17/23   Cathren Harsh, MD  mirabegron ER [facility])  25 MG TB24 tablet Take 25 mg by mouth daily. 06/29/22   [provider]  mirtazapine (REMERON) 15 MG tablet Take 15 mg by mouth at bedtime as needed (sleep). 11/06/20   [provider]  naloxone [facility]) nasal spray 4 mg/0.1 mL Place 1 spray into the nose once. SPRAY 1 SPRAY INTO ONE NOSTRIL AS DIRECTED FOR OPIOID OVERDOSE - CALL 911 IMMEDIATELY, ADMINISTER DOSE, THEN TURN PERSON ON SIDE - IF NO RESPONSE IN 2-3 MINUTES OR PERSON RESPONDS BUT RELAPSES, REPEAT USING A NEW SPRAY DEVICE AND SPRAY INTO THE OTHER NOSTRIL 10/13/22    [provider]  nystatin (MYCOSTATIN/NYSTOP) powder Apply to affected area 2 (two) times daily to groin and areas of fungal infection Patient taking differently: Apply 1 application  topically 2 (two) times daily. Uses in groin area and under breasts 02/03/21   Noralee Stain, DO  nystatin cream (MYCOSTATIN) Apply 1 Application topically 2 (two) times daily.    [provider]  ondansetron (ZOFRAN) 4 MG tablet Take 1 tablet (4 mg total) by mouth every 8 (eight) hours as needed for nausea or vomiting. 01/17/23   Rai, Ripudeep Kirtland Bouchard, MD  Oxycodone HCl 10 MG TABS Take 10 mg by mouth 2 (two) times daily as needed (pain). 12/16/22   [provider]  pantoprazole (PROTONIX) 40 MG tablet Take 40 mg by mouth daily before breakfast.    [provider]  polyethylene glycol (MIRALAX) 17 g packet Take 17 g by mouth daily. Also available over-the-counter 01/17/23   Rai, Delene Ruffini, MD  Polyethylene Glycol 3350 POWD Take 17 g by mouth daily as needed (constipation). 06/01/22   [provider]  potassium chloride SA (KLOR-CON M) 20 MEQ tablet Take 1 tablet (20 mEq total) by mouth daily. 01/18/23   Rai, Delene Ruffini, MD  pregabalin (LYRICA) 50 MG capsule Take 1 capsule (50 mg total) by mouth 2 (two) times daily. 01/17/23   Rai, Delene Ruffini, MD  Semaglutide, 2 MG/DOSE, 8 MG/3ML SOPN Inject 2 mg into the skin once a week. 10/20/22   [provider]  torsemide (DEMADEX) 20 MG tablet Take 1 tablet (20 mg total) by mouth daily. 01/18/23   Rai, Delene Ruffini, MD  Vitamins A & D (VITAMIN A & D) OINT Apply 1 application  topically daily as needed (skin care). 08/11/22   [provider]      Allergies    Lisinopril, Morphine, and Penicillins    Review of Systems   Review of Systems  Unable to perform ROS: Mental status change  Constitutional:  Positive for fatigue. Negative for chills.  HENT:  Negative for congestion.   Respiratory:  Positive for chest tightness  and shortness of breath. Negative for wheezing.   Cardiovascular:  Negative for chest pain.  Gastrointestinal:  Negative for abdominal pain, constipation, diarrhea, nausea and vomiting.  Genitourinary:  Negative for dysuria and flank pain.  Musculoskeletal:  Negative for back pain.  Skin:  Negative for rash and wound.  Neurological:  Negative for headaches.  Psychiatric/Behavioral:  Positive for confusion.   All other systems reviewed and are negative.   Physical Exam Updated Vital Signs BP (!) 138/103   Pulse 81   Temp (!) 97.4 F (36.3 C) (Oral)   Resp (!) 23   Ht 5\' 2"  (1.575 m)   Wt (!) 137.5 kg   SpO2 94%   BMI 55.44 kg/m  Physical Exam Vitals and nursing note reviewed.  Constitutional:      General:  She is not in acute distress.    Appearance: She is well-developed. She is ill-appearing. She is not toxic-appearing or diaphoretic.  HENT:     Head: Normocephalic and atraumatic.     Nose: No congestion or rhinorrhea.     Mouth/Throat:     Mouth: Mucous membranes are moist.     Pharynx: No oropharyngeal exudate or posterior oropharyngeal erythema.  Eyes:     Extraocular Movements: Extraocular movements intact.     Conjunctiva/sclera: Conjunctivae normal.     Pupils: Pupils are equal, round, and reactive to light.  Cardiovascular:     Rate and Rhythm: Normal rate and regular rhythm.     Heart sounds: No murmur heard. Pulmonary:     Effort: Pulmonary effort is normal. No respiratory distress.     Breath sounds: Rhonchi and rales present.  Chest:     Chest wall: No tenderness.  Abdominal:     Palpations: Abdomen is soft.     Tenderness: There is no abdominal tenderness. There is no right CVA tenderness, left CVA tenderness, guarding or rebound.  Musculoskeletal:        General: No swelling or tenderness.     Cervical back: Neck supple. No tenderness.     Right lower leg: Edema present.     Left lower leg: Edema present.  Skin:    General: Skin is warm and dry.      Capillary Refill: Capillary refill takes less than 2 seconds.     Findings: No erythema.  Neurological:     Mental Status: She is alert.     Sensory: No sensory deficit.     Motor: No weakness.     ED Results / Procedures / Treatments   Labs (all labs ordered are listed, but only abnormal results are displayed) Labs Reviewed  CBC WITH DIFFERENTIAL/PLATELET - Abnormal; Notable for the following components:      Result Value   RBC 6.58 (*)    Hemoglobin 15.1 (*)    HCT 55.2 (*)    MCH 22.9 (*)    MCHC 27.4 (*)    RDW 22.5 (*)    nRBC 1.8 (*)    All other components within normal limits  BRAIN NATRIURETIC PEPTIDE - Abnormal; Notable for the following components:   B Natriuretic Peptide 432.5 (*)    All other components within normal limits  COMPREHENSIVE METABOLIC PANEL - Abnormal; Notable for the following components:   Potassium 6.2 (*)    Chloride 95 (*)    BUN 100 (*)    Creatinine, Ser 5.55 (*)    Total Protein 8.3 (*)    Alkaline Phosphatase 210 (*)    Total Bilirubin 1.5 (*)    GFR, Estimated 8 (*)    Anion gap 19 (*)    All other components within normal limits  CBG MONITORING, ED - Abnormal; Notable for the following components:   Glucose-Capillary 18 (*)    All other components within normal limits  I-STAT VENOUS BLOOD GAS, ED - Abnormal; Notable for the following components:   pH, Ven 7.188 (*)    pCO2, Ven 91.6 (*)    Bicarbonate 34.8 (*)    TCO2 38 (*)    Sodium 134 (*)    Potassium 7.8 (*)    Calcium, Ion 1.01 (*)    HCT 57.0 (*)    Hemoglobin 19.4 (*)    All other components within normal limits  CBG MONITORING, ED - Abnormal; Notable for the following components:  Glucose-Capillary 68 (*)    All other components within normal limits  I-STAT ARTERIAL BLOOD GAS, ED - Abnormal; Notable for the following components:   pH, Arterial 7.185 (*)    pCO2 arterial 85.3 (*)    pO2, Arterial 71 (*)    Bicarbonate 32.5 (*)    TCO2 35 (*)    Potassium 5.3  (*)    HCT 53.0 (*)    Hemoglobin 18.0 (*)    All other components within normal limits  TROPONIN I (HIGH SENSITIVITY) - Abnormal; Notable for the following components:   Troponin I (High Sensitivity) 21 (*)    All other components within normal limits  RESP PANEL BY RT-PCR (RSV, FLU A&B, COVID)  RVPGX2  LACTIC ACID, PLASMA  LACTIC ACID, PLASMA  LIPASE, BLOOD  URINALYSIS, W/ REFLEX TO CULTURE (INFECTION SUSPECTED)  BLOOD GAS, ARTERIAL  CBG MONITORING, ED  TROPONIN I (HIGH SENSITIVITY)    EKG EKG Interpretation Date/Time:  Wednesday February 15 2023 19:45:49 EST Ventricular Rate:  80 PR Interval:  178 QRS Duration:  92 QT Interval:  463 QTC Calculation: 535 R Axis:   121  Text Interpretation: Sinus rhythm Low voltage, precordial leads Probable right ventricular hypertrophy Prolonged QT interval when cmpared to prior, similar appearance. No STEMI Confirmed by Theda Belfast ([id number]) on 02/15/2023 8:06:18 PM  Radiology DG Chest Portable 1 View Result Date: 02/15/2023 CLINICAL DATA:  Shortness of breath. EXAM: PORTABLE CHEST 1 VIEW COMPARISON:  January 13, 2023 FINDINGS: There is stable mild to moderate severity cardiac silhouette enlargement. Mild prominence of the central pulmonary vasculature is seen without evidence of frank pulmonary edema. Low lung volumes are noted with mild bilateral infrahilar atelectasis. No pleural effusion or pneumothorax is identified. The visualized skeletal structures are unremarkable. IMPRESSION: 1. Stable cardiomegaly with mild pulmonary vascular congestion. 2. Low lung volumes with mild bilateral infrahilar atelectasis. Electronically Signed   By: Aram Candela M.D.   On: 02/15/2023 20:10    Procedures Procedures    CRITICAL CARE Performed by: Canary Brim Donesha Wallander Total critical care time: 20 minutes Critical care time was exclusive of separately billable procedures and treating other patients. Critical care was necessary to treat or  prevent imminent or life-threatening deterioration. Critical care was time spent personally by me on the following activities: development of treatment plan with patient and/or surrogate as well as nursing, discussions with consultants, evaluation of patient's response to treatment, examination of patient, obtaining history from patient or surrogate, ordering and performing treatments and interventions, ordering and review of laboratory studies, ordering and review of radiographic studies, pulse oximetry and re-evaluation of patient's condition.  Medications Ordered in ED Medications - No data to display  ED Course/ Medical Decision Making/ A&P                                 Medical Decision Making Amount and/or Complexity of Data Reviewed Labs: ordered. Radiology: ordered.  Risk Decision regarding hospitalization.    [name] [name] is a 67 y.o. female with a complex past medical history including hypertension, diabetes, heart failure, previous DKA, obesity, anxiety, depression, previous encephalopathy, and recent admission for hypoxic respiratory failure related to fluid overload who presents with altered mental status and hypoxia.  According to report to nursing from EMS, patient was altered and family called 911.  She was reportedly somewhat unresponsive and disoriented and EMS found oxygen saturation to 35%.  EMS reported to nursing  that patient was possibly poorly compliant with oxygen supplementation.  Patient had a nonrebreather to get her back in the 90s and then has been on 6 L to keep oxygen saturations in the 90s on arrival.  Patient reports she has had some chills and cough as well as nausea but denies diarrhea.  Denies vomiting.  Denies any chest pain or abdominal pain but does agree to some shortness of breath at times.  Reports has had this productive cough.  She denies any headache, neck pain, chest pain, or abdominal pain aside from a chronic abdominal comfort that is not  new.  Denies any other complaints at this time but is confused as to why she is here.  On exam, lungs have some significant rales and rhonchi and wheezing.  Chest and abdomen are nontender.  Does have a slight murmur.  No tenderness in extremities.  Some edema in the legs.  Initial glucose reportedly found to be low at team but then it was redrawn and was in the 60s.  Will collected again as well as other labs.  Will repeat her blood gas as well as I am concerned about a abnormal blood draw.  Due to her symptoms, will get labs, imaging with a chest x-ray, urinalysis, and due to the hypoxia and altered mental status will likely require admission.  Anticipate reassessment after workup to determine disposition.  11:38 PM Workup returned and patient does have both hypercarbic and epoxy respiratory failure.  She is doing much better on BiPAP.  Initial potassium staying greater than 7 was repeated and is 6.2 with a MOLST.  Suspect is closer to normal.  Creatinine is elevated however with AKI.  X-ray did not show pneumonia but I am concerned about fluid overload and hypercarbia causing altered mental status.  Medicine will admit for further management.  They will evaluate to determine best diuresis plan for her given her AKI.  Patient will be mated for further management.         Final Clinical Impression(s) / ED Diagnoses Final diagnoses:  Altered mental status, unspecified altered mental status type  Hypercarbia  AKI (acute kidney injury) (HCC)  Hypervolemia, unspecified hypervolemia type    Rx / DC Orders ED Discharge Orders     None      Clinical Impression: 1. Altered mental status, unspecified altered mental status type   2. Hypercarbia   3. AKI (acute kidney injury) (HCC)   4. Hypervolemia, unspecified hypervolemia type     Disposition: Admit  This note was prepared with assistance of Dragon voice recognition software. Occasional wrong-word or sound-a-like  substitutions may have occurred due to the inherent limitations of voice recognition software.      Tezra Mahr, Canary Brim, MD 02/15/23 [phone]

## 2023-02-15 NOTE — ED Notes (Signed)
Pt cussing staff calling staff "punk bitches" and "faggots", this RN instructed pt to stop calling staff names and cussing as it is inappropriate and derogatory, pt continues to cuss this RN

## 2023-02-15 NOTE — Progress Notes (Signed)
Pt. Did not tolerate abg procedure. RT attempted to retrieve abg but pt. became irate during procedure and began to refuse. RN aware.

## 2023-02-16 ENCOUNTER — Inpatient Hospital Stay (HOSPITAL_COMMUNITY): Payer: Non-veteran care

## 2023-02-16 DIAGNOSIS — Z7189 Other specified counseling: Secondary | ICD-10-CM | POA: Diagnosis not present

## 2023-02-16 DIAGNOSIS — Z515 Encounter for palliative care: Secondary | ICD-10-CM | POA: Diagnosis not present

## 2023-02-16 DIAGNOSIS — E785 Hyperlipidemia, unspecified: Secondary | ICD-10-CM | POA: Diagnosis present

## 2023-02-16 DIAGNOSIS — N179 Acute kidney failure, unspecified: Secondary | ICD-10-CM | POA: Diagnosis present

## 2023-02-16 DIAGNOSIS — E1140 Type 2 diabetes mellitus with diabetic neuropathy, unspecified: Secondary | ICD-10-CM | POA: Diagnosis present

## 2023-02-16 DIAGNOSIS — E662 Morbid (severe) obesity with alveolar hypoventilation: Secondary | ICD-10-CM | POA: Diagnosis present

## 2023-02-16 DIAGNOSIS — Z992 Dependence on renal dialysis: Secondary | ICD-10-CM | POA: Diagnosis not present

## 2023-02-16 DIAGNOSIS — J9601 Acute respiratory failure with hypoxia: Secondary | ICD-10-CM | POA: Diagnosis present

## 2023-02-16 DIAGNOSIS — Z7401 Bed confinement status: Secondary | ICD-10-CM | POA: Diagnosis not present

## 2023-02-16 DIAGNOSIS — Z1152 Encounter for screening for COVID-19: Secondary | ICD-10-CM | POA: Diagnosis not present

## 2023-02-16 DIAGNOSIS — I132 Hypertensive heart and chronic kidney disease with heart failure and with stage 5 chronic kidney disease, or end stage renal disease: Secondary | ICD-10-CM | POA: Diagnosis present

## 2023-02-16 DIAGNOSIS — F32A Depression, unspecified: Secondary | ICD-10-CM | POA: Diagnosis present

## 2023-02-16 DIAGNOSIS — Z6841 Body Mass Index (BMI) 40.0 and over, adult: Secondary | ICD-10-CM | POA: Diagnosis not present

## 2023-02-16 DIAGNOSIS — G9341 Metabolic encephalopathy: Secondary | ICD-10-CM | POA: Diagnosis present

## 2023-02-16 DIAGNOSIS — R532 Functional quadriplegia: Secondary | ICD-10-CM | POA: Diagnosis present

## 2023-02-16 DIAGNOSIS — R0689 Other abnormalities of breathing: Secondary | ICD-10-CM | POA: Diagnosis present

## 2023-02-16 DIAGNOSIS — N186 End stage renal disease: Secondary | ICD-10-CM | POA: Diagnosis present

## 2023-02-16 DIAGNOSIS — R0902 Hypoxemia: Secondary | ICD-10-CM | POA: Diagnosis not present

## 2023-02-16 DIAGNOSIS — R531 Weakness: Secondary | ICD-10-CM | POA: Diagnosis not present

## 2023-02-16 DIAGNOSIS — J9602 Acute respiratory failure with hypercapnia: Secondary | ICD-10-CM | POA: Diagnosis present

## 2023-02-16 DIAGNOSIS — I5033 Acute on chronic diastolic (congestive) heart failure: Secondary | ICD-10-CM | POA: Diagnosis present

## 2023-02-16 DIAGNOSIS — E1122 Type 2 diabetes mellitus with diabetic chronic kidney disease: Secondary | ICD-10-CM | POA: Diagnosis present

## 2023-02-16 LAB — MRSA NEXT GEN BY PCR, NASAL: MRSA by PCR Next Gen: DETECTED — AB

## 2023-02-16 LAB — URINALYSIS, W/ REFLEX TO CULTURE (INFECTION SUSPECTED)
Bilirubin Urine: NEGATIVE
Glucose, UA: NEGATIVE mg/dL
Hgb urine dipstick: NEGATIVE
Ketones, ur: NEGATIVE mg/dL
Leukocytes,Ua: NEGATIVE
Nitrite: NEGATIVE
Protein, ur: 30 mg/dL — AB
Specific Gravity, Urine: 1.012 (ref 1.005–1.030)
pH: 5 (ref 5.0–8.0)

## 2023-02-16 LAB — CBC
HCT: 56.9 % — ABNORMAL HIGH (ref 36.0–46.0)
Hemoglobin: 15.7 g/dL — ABNORMAL HIGH (ref 12.0–15.0)
MCH: 23.3 pg — ABNORMAL LOW (ref 26.0–34.0)
MCHC: 27.6 g/dL — ABNORMAL LOW (ref 30.0–36.0)
MCV: 84.3 fL (ref 80.0–100.0)
Platelets: 273 10*3/uL (ref 150–400)
RBC: 6.75 MIL/uL — ABNORMAL HIGH (ref 3.87–5.11)
RDW: 22.1 % — ABNORMAL HIGH (ref 11.5–15.5)
WBC: 4.9 10*3/uL (ref 4.0–10.5)
nRBC: 1.6 % — ABNORMAL HIGH (ref 0.0–0.2)

## 2023-02-16 LAB — SODIUM, URINE, RANDOM: Sodium, Ur: 22 mmol/L

## 2023-02-16 LAB — RESP PANEL BY RT-PCR (RSV, FLU A&B, COVID)  RVPGX2
Influenza A by PCR: NEGATIVE
Influenza B by PCR: NEGATIVE
Resp Syncytial Virus by PCR: NEGATIVE
SARS Coronavirus 2 by RT PCR: NEGATIVE

## 2023-02-16 LAB — BASIC METABOLIC PANEL
Anion gap: 15 (ref 5–15)
BUN: 98 mg/dL — ABNORMAL HIGH (ref 8–23)
CO2: 28 mmol/L (ref 22–32)
Calcium: 9.1 mg/dL (ref 8.9–10.3)
Chloride: 94 mmol/L — ABNORMAL LOW (ref 98–111)
Creatinine, Ser: 5.54 mg/dL — ABNORMAL HIGH (ref 0.44–1.00)
GFR, Estimated: 8 mL/min — ABNORMAL LOW (ref >60)
Glucose, Bld: 64 mg/dL — ABNORMAL LOW (ref 70–99)
Potassium: 5 mmol/L (ref 3.5–5.1)
Sodium: 137 mmol/L (ref 135–145)

## 2023-02-16 LAB — GLUCOSE, CAPILLARY
Glucose-Capillary: 80 mg/dL (ref 70–99)
Glucose-Capillary: 43 mg/dL — CL (ref 70–99)
Glucose-Capillary: 84 mg/dL (ref 70–99)
Glucose-Capillary: 92 mg/dL (ref 70–99)
Glucose-Capillary: 51 mg/dL — ABNORMAL LOW (ref 70–99)
Glucose-Capillary: 67 mg/dL — ABNORMAL LOW (ref 70–99)
Glucose-Capillary: 79 mg/dL (ref 70–99)
Glucose-Capillary: 86 mg/dL (ref 70–99)

## 2023-02-16 LAB — LACTIC ACID, PLASMA: Lactic Acid, Venous: 1.8 mmol/L (ref 0.5–1.9)

## 2023-02-16 LAB — CREATININE, URINE, RANDOM: Creatinine, Urine: 138 mg/dL

## 2023-02-16 LAB — LIPASE, BLOOD: Lipase: 30 U/L (ref 11–51)

## 2023-02-16 MED ORDER — FUROSEMIDE 10 MG/ML IJ SOLN
100.0000 mg | Freq: Once | INTRAVENOUS | Status: AC
  Administered 2023-02-16: 100 mg via INTRAVENOUS
  Filled 2023-02-16: qty 10

## 2023-02-16 MED ORDER — ONDANSETRON HCL 4 MG PO TABS: 4.0000 mg | ORAL_TABLET | Freq: Four times a day (QID) | ORAL | Status: DC | PRN

## 2023-02-16 MED ORDER — ATORVASTATIN CALCIUM 10 MG PO TABS
10.0000 mg | ORAL_TABLET | Freq: Every day | ORAL | Status: DC
  Administered 2023-02-16 – 2023-02-22 (×5): 10 mg via ORAL
  Filled 2023-02-16 (×7): qty 1

## 2023-02-16 MED ORDER — MIDODRINE HCL 5 MG PO TABS
5.0000 mg | ORAL_TABLET | Freq: Three times a day (TID) | ORAL | Status: DC
  Administered 2023-02-16 – 2023-02-17 (×2): 5 mg via ORAL
  Filled 2023-02-16 (×4): qty 1

## 2023-02-16 MED ORDER — ESCITALOPRAM OXALATE 10 MG PO TABS
10.0000 mg | ORAL_TABLET | Freq: Every morning | ORAL | Status: DC
Start: 2023-02-16 — End: 2023-02-23
  Administered 2023-02-16 – 2023-02-23 (×8): 10 mg via ORAL
  Filled 2023-02-16 (×8): qty 1

## 2023-02-16 MED ORDER — INSULIN DETEMIR 100 UNIT/ML ~~LOC~~ SOLN
25.0000 [IU] | Freq: Every day | SUBCUTANEOUS | Status: DC
  Filled 2023-02-16 (×3): qty 0.25

## 2023-02-16 MED ORDER — MUPIROCIN 2 % EX OINT
1.0000 | TOPICAL_OINTMENT | Freq: Two times a day (BID) | CUTANEOUS | Status: AC
  Administered 2023-02-16 – 2023-02-20 (×9): 1 via NASAL
  Filled 2023-02-16 (×2): qty 22

## 2023-02-16 MED ORDER — DEXTROSE 50 % IV SOLN
INTRAVENOUS | Status: AC
  Administered 2023-02-16: 25 mL
  Filled 2023-02-16: qty 50

## 2023-02-16 MED ORDER — ACETAMINOPHEN 650 MG RE SUPP: 650.0000 mg | Freq: Four times a day (QID) | RECTAL | Status: DC | PRN

## 2023-02-16 MED ORDER — MIRABEGRON ER 25 MG PO TB24
25.0000 mg | ORAL_TABLET | Freq: Every day | ORAL | Status: DC
Start: 2023-02-16 — End: 2023-02-23
  Administered 2023-02-17 – 2023-02-23 (×7): 25 mg via ORAL
  Filled 2023-02-16 (×8): qty 1

## 2023-02-16 MED ORDER — FUROSEMIDE 10 MG/ML IJ SOLN
80.0000 mg | Freq: Two times a day (BID) | INTRAMUSCULAR | Status: AC
  Administered 2023-02-16 – 2023-02-17 (×3): 80 mg via INTRAVENOUS
  Filled 2023-02-16 (×3): qty 8

## 2023-02-16 MED ORDER — ONDANSETRON HCL 4 MG/2ML IJ SOLN
4.0000 mg | Freq: Four times a day (QID) | INTRAMUSCULAR | Status: DC | PRN
  Administered 2023-02-22: 4 mg via INTRAVENOUS
  Filled 2023-02-16: qty 2

## 2023-02-16 MED ORDER — ENOXAPARIN SODIUM 30 MG/0.3ML IJ SOSY
30.0000 mg | PREFILLED_SYRINGE | INTRAMUSCULAR | Status: DC
  Administered 2023-02-16 – 2023-02-22 (×7): 30 mg via SUBCUTANEOUS
  Filled 2023-02-16 (×7): qty 0.3

## 2023-02-16 MED ORDER — INSULIN ASPART 100 UNIT/ML IJ SOLN
0.0000 [IU] | Freq: Three times a day (TID) | INTRAMUSCULAR | Status: DC
  Administered 2023-02-19 – 2023-02-21 (×5): 1 [IU] via SUBCUTANEOUS
  Administered 2023-02-22: 2 [IU] via SUBCUTANEOUS
  Administered 2023-02-22 – 2023-02-23 (×2): 1 [IU] via SUBCUTANEOUS

## 2023-02-16 MED ORDER — MIRTAZAPINE 15 MG PO TABS
15.0000 mg | ORAL_TABLET | Freq: Every evening | ORAL | Status: DC | PRN
Start: 2023-02-15 — End: 2023-02-23

## 2023-02-16 MED ORDER — ACETAMINOPHEN 325 MG PO TABS
650.0000 mg | ORAL_TABLET | Freq: Four times a day (QID) | ORAL | Status: DC | PRN
  Administered 2023-02-21: 650 mg via ORAL
  Filled 2023-02-16: qty 2

## 2023-02-16 MED ORDER — ALLOPURINOL 100 MG PO TABS
50.0000 mg | ORAL_TABLET | ORAL | Status: DC
Start: 2023-02-16 — End: 2023-02-23
  Administered 2023-02-18 – 2023-02-22 (×3): 50 mg via ORAL
  Filled 2023-02-16 (×4): qty 1

## 2023-02-16 MED ORDER — CHLORHEXIDINE GLUCONATE CLOTH 2 % EX PADS
6.0000 | MEDICATED_PAD | Freq: Every day | CUTANEOUS | Status: AC
  Administered 2023-02-17 – 2023-02-21 (×4): 6 via TOPICAL

## 2023-02-16 MED ORDER — PREGABALIN 25 MG PO CAPS
50.0000 mg | ORAL_CAPSULE | Freq: Two times a day (BID) | ORAL | Status: DC
  Administered 2023-02-16: 50 mg via ORAL
  Filled 2023-02-16: qty 2

## 2023-02-16 NOTE — Progress Notes (Signed)
Notified provider patient refused all oral morning medications that were scheduled.
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
## 2023-02-16 NOTE — Progress Notes (Signed)
Pt transported from ED to 4E25 on Bipap, No complications noted

## 2023-02-16 NOTE — Progress Notes (Signed)
BS 43, 1/2 amp of D50 given, recheck 83
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
## 2023-02-16 NOTE — Progress Notes (Signed)
Patient seen and examined.  Admitted early morning hours by nighttime hospitalist.  See H&P assessment plan.  In brief, 67 year old female with extensive medical issues, type 2 diabetes on insulin, hypertension, obesity and hypoventilation on BiPAP at home that she is noncompliant, recurrent admissions, functional quadriplegia and bedbound wheelchair-bound brought to the emergency room with confusion and lethargy.  Patient does not remember what happened to her.  On arrival to the ER she was brought with nonrebreather.  Apparently her blood glucose was 18.  Her pH was 7.18 with CO2 of 85.3.  She was given dextrose injections, started on BiPAP and admitted to the medical floor. Her creatinine is significantly elevated.  Blood sugar remains low.  Seen and examined.  Gave her a break from BiPAP.  Patient is alert awake.  Patient tells me that she wears BiPAP for 4 hours at home and does not know what happened to her.  She denies any new events, fever at home.  She thinks she took her insulin and not sure how much she ate.  Acute metabolic encephalopathy, multifactorial.  Hypercapnia and hypoglycemia. Keep on BiPAP continuously with meal breaks.  Keep on oxygen to keep saturation more than 90%. Hold long-acting insulin.  Will allow regular diet and keep on sliding scale insulin today.  She has significant acute kidney injury with baseline chronic kidney disease stage IIIb, previous baseline creatinine of 1.6.  Received 1 dose of Lasix yesterday.  Recent echocardiogram with normal ejection fraction. Intake and output monitoring.  Renal ultrasound.  Bladder scan.  Total time spent: 30 minutes.  Same day admit.  No charge visit.  Called for nephrology consultation. Also will consult palliative care team.
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
## 2023-02-16 NOTE — Plan of Care (Signed)
  Problem: Education: Goal: Ability to describe self-care measures that may prevent or decrease complications (Diabetes Survival Skills Education) will improve Outcome: Progressing Goal: Individualized Educational Video(s) Outcome: Progressing   Problem: Coping: Goal: Ability to adjust to condition or change in health will improve Outcome: Progressing   Problem: Fluid Volume: Goal: Ability to maintain a balanced intake and output will improve Outcome: Progressing   Problem: Health Behavior/Discharge Planning: Goal: Ability to identify and utilize available resources and services will improve Outcome: Progressing Goal: Ability to manage health-related needs will improve Outcome: Progressing   Problem: Metabolic: Goal: Ability to maintain appropriate glucose levels will improve Outcome: Progressing   Problem: Nutritional: Goal: Maintenance of adequate nutrition will improve Outcome: Progressing   Problem: Skin Integrity: Goal: Risk for impaired skin integrity will decrease Outcome: Progressing   Problem: Tissue Perfusion: Goal: Adequacy of tissue perfusion will improve Outcome: Progressing   Problem: Education: Goal: Knowledge of General Education information will improve Description: Including pain rating scale, medication(s)/side effects and non-pharmacologic comfort measures Outcome: Progressing   Problem: Health Behavior/Discharge Planning: Goal: Ability to manage health-related needs will improve Outcome: Progressing   Problem: Clinical Measurements: Goal: Ability to maintain clinical measurements within normal limits will improve Outcome: Progressing Goal: Will remain free from infection Outcome: Progressing Goal: Diagnostic test results will improve Outcome: Progressing Goal: Respiratory complications will improve Outcome: Progressing Goal: Cardiovascular complication will be avoided Outcome: Progressing   Problem: Activity: Goal: Risk for activity  intolerance will decrease Outcome: Progressing   Problem: Nutrition: Goal: Adequate nutrition will be maintained Outcome: Progressing   Problem: Coping: Goal: Level of anxiety will decrease Outcome: Progressing   Problem: Elimination: Goal: Will not experience complications related to bowel motility Outcome: Progressing Goal: Will not experience complications related to urinary retention Outcome: Progressing   Problem: Pain Management: Goal: General experience of comfort will improve Outcome: Progressing   Problem: Safety: Goal: Ability to remain free from injury will improve Outcome: Progressing   Problem: Skin Integrity: Goal: Risk for impaired skin integrity will decrease Outcome: Progressing

## 2023-02-16 NOTE — Progress Notes (Signed)
Assessed bilateral arms. Poor vasculature. Recommendation to place PIV when needed for infusions/tests. No infusions/test ordered at this time requiring PIV access. Notified nurse. Patient's RN VU. Tomasita Morrow, RN VAST
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
## 2023-02-16 NOTE — Inpatient Diabetes Management (Signed)
Inpatient Diabetes Program Recommendations  AACE/ADA: New Consensus Statement on Inpatient Glycemic Control (2015)  Target Ranges:  Prepandial:   less than 140 mg/dL      Peak postprandial:   less than 180 mg/dL (1-2 hours)      Critically ill patients:  140 - 180 mg/dL   Lab Results  Component Value Date   GLUCAP 84 02/16/2023   HGBA1C 9.2 (H) 01/15/2023    Review of Glycemic Control  Latest Reference Range & Units 02/15/23 19:46 02/16/23 02:22 02/16/23 06:09 02/16/23 06:44  Glucose-Capillary 70 - 99 mg/dL 68 (L) 80 43 (LL) 84  (LL): Data is critically low (L): Data is abnormally low  Diabetes history: DM2/ESRD Outpatient Diabetes medications: Semglee 58 units every day, Ozempic 2 mg weekly Current orders for Inpatient glycemic control: Levemir 25 units every day, Novolog 0-9 units TID  Inpatient Diabetes Program Recommendations:    Please hold Levemir 25 units this morning as glucose trending low.     Will continue to follow while inpatient.  Thank you, Dulce Sellar, MSN, CDCES Diabetes Coordinator Inpatient Diabetes Program [phone] (team pager from 8a-5p)

## 2023-02-17 LAB — COMPREHENSIVE METABOLIC PANEL
ALT: 36 U/L (ref 0–44)
AST: 17 U/L (ref 15–41)
Albumin: 3.8 g/dL (ref 3.5–5.0)
Alkaline Phosphatase: 191 U/L — ABNORMAL HIGH (ref 38–126)
Anion gap: 14 (ref 5–15)
BUN: 93 mg/dL — ABNORMAL HIGH (ref 8–23)
CO2: 29 mmol/L (ref 22–32)
Calcium: 9.1 mg/dL (ref 8.9–10.3)
Chloride: 96 mmol/L — ABNORMAL LOW (ref 98–111)
Creatinine, Ser: 4.8 mg/dL — ABNORMAL HIGH (ref 0.44–1.00)
GFR, Estimated: 9 mL/min — ABNORMAL LOW (ref >60)
Glucose, Bld: 63 mg/dL — ABNORMAL LOW (ref 70–99)
Potassium: 4.6 mmol/L (ref 3.5–5.1)
Sodium: 139 mmol/L (ref 135–145)
Total Bilirubin: 1 mg/dL (ref <1.2)
Total Protein: 8.2 g/dL — ABNORMAL HIGH (ref 6.5–8.1)

## 2023-02-17 LAB — CBC WITH DIFFERENTIAL/PLATELET
Abs Immature Granulocytes: 0.02 10*3/uL (ref 0.00–0.07)
Basophils Absolute: 0 10*3/uL (ref 0.0–0.1)
Basophils Relative: 0 %
Eosinophils Absolute: 0.1 10*3/uL (ref 0.0–0.5)
Eosinophils Relative: 2 %
HCT: 52.7 % — ABNORMAL HIGH (ref 36.0–46.0)
Hemoglobin: 14.8 g/dL (ref 12.0–15.0)
Immature Granulocytes: 0 %
Lymphocytes Relative: 23 %
Lymphs Abs: 1.1 10*3/uL (ref 0.7–4.0)
MCH: 23.2 pg — ABNORMAL LOW (ref 26.0–34.0)
MCHC: 28.1 g/dL — ABNORMAL LOW (ref 30.0–36.0)
MCV: 82.6 fL (ref 80.0–100.0)
Monocytes Absolute: 0.7 10*3/uL (ref 0.1–1.0)
Monocytes Relative: 14 %
Neutro Abs: 2.7 10*3/uL (ref 1.7–7.7)
Neutrophils Relative %: 61 %
Platelets: 271 10*3/uL (ref 150–400)
RBC: 6.38 MIL/uL — ABNORMAL HIGH (ref 3.87–5.11)
RDW: 21.9 % — ABNORMAL HIGH (ref 11.5–15.5)
WBC: 4.6 10*3/uL (ref 4.0–10.5)
nRBC: 0.9 % — ABNORMAL HIGH (ref 0.0–0.2)

## 2023-02-17 LAB — GLUCOSE, CAPILLARY
Glucose-Capillary: 77 mg/dL (ref 70–99)
Glucose-Capillary: 71 mg/dL (ref 70–99)
Glucose-Capillary: 76 mg/dL (ref 70–99)
Glucose-Capillary: 98 mg/dL (ref 70–99)

## 2023-02-17 LAB — MAGNESIUM: Magnesium: 3.8 mg/dL — ABNORMAL HIGH (ref 1.7–2.4)

## 2023-02-17 LAB — PHOSPHORUS: Phosphorus: 7.3 mg/dL — ABNORMAL HIGH (ref 2.5–4.6)

## 2023-02-17 MED ORDER — MIDODRINE HCL 5 MG PO TABS
10.0000 mg | ORAL_TABLET | Freq: Three times a day (TID) | ORAL | Status: DC
  Administered 2023-02-17 – 2023-02-20 (×7): 10 mg via ORAL
  Filled 2023-02-17 (×8): qty 2

## 2023-02-17 NOTE — Progress Notes (Signed)
Subjective:  UOP 1250-  BUN and crt trending down since admission-  she is alert - able to participate in pleasant conversation but unclear how much she understands-  currently on bipap  Objective Vital signs in last 24 hours: Vitals:   02/16/23 2200 02/17/23 0009 02/17/23 0338 02/17/23 0925  BP:  112/61 114/61   Pulse:  71 73   Resp:  18 16   Temp:  (!) 97.5 F (36.4 C) 97.7 F (36.5 C)   TempSrc:  Axillary Axillary   SpO2: 93% 93% 92% 94%  Weight:      Height:       Weight change:   Intake/Output Summary (Last 24 hours) at 02/17/2023 1016 Last data filed at 02/17/2023 0656 Gross per 24 hour  Intake 60 ml  Output 850 ml  Net -790 ml    Assessment/ Plan: Pt is a 67 y.o. yo female with obesity, DM, HTN, osa, bedbound who was admitted on 02/15/2023 with increasing SOB, hypoxia and A on CRF  Assessment/Plan: 1. Renal-  A on CRF-  able to find out baseline crt 1.6-2-  now with A on CRF 5.5 on presentation- urine bland- no obvious obstruction on [place] although left kidney not visualized well.  Thinking is hemodynamic issue with heart failure.  Renal function has improved last 24 hours-  reasonable urine on lasix 80 q 12-  soft BP-  will inc midodrine but not change lasix today and follow.  No dialysis indications at present 2. HTN/volume-  seems overloaded-  on iv lasix as above- not robust UOP but unclear if all measured 3. Anemia- not an issue right now  4. pulm-  hypoxia and OSA-  on bipap-  sounds like mentation is improved but no follow up ABG-  iv lasix for pulmonary edema  5. Hyperkalemia-  resolved    Cecille Aver    Labs: Basic Metabolic Panel: Recent Labs  Lab 02/15/23 2058 02/15/23 2136 02/16/23 0322 02/17/23 0319  NA 137 136 137 139  K 6.2* 5.3* 5.0 4.6  CL 95*  --  94* 96*  CO2 23  --  28 29  GLUCOSE 73  --  64* 63*  BUN 100*  --  98* 93*  CREATININE 5.55*  --  5.54* 4.80*  CALCIUM 9.0  --  9.1 9.1  PHOS  --   --   --  7.3*   Liver Function  Tests: Recent Labs  Lab 02/15/23 2058 02/17/23 0319  AST 32 17  ALT 42 36  ALKPHOS 210* 191*  BILITOT 1.5* 1.0  PROT 8.3* 8.2*  ALBUMIN 3.8 3.8   Recent Labs  Lab 02/16/23 0322  LIPASE 30   No results for input(s): "AMMONIA" in the last 168 hours. CBC: Recent Labs  Lab 02/15/23 1949 02/15/23 1956 02/15/23 2136 02/16/23 0322 02/17/23 0319  WBC 4.9  --   --  4.9 4.6  NEUTROABS 3.7  --   --   --  2.7  HGB 15.1*   < > 18.0* 15.7* 14.8  HCT 55.2*   < > 53.0* 56.9* 52.7*  MCV 83.9  --   --  84.3 82.6  PLT 280  --   --  273 271   < > = values in this interval not displayed.   Cardiac Enzymes: No results for input(s): "CKTOTAL", "CKMB", "CKMBINDEX", "TROPONINI" in the last 168 hours. CBG: Recent Labs  Lab 02/16/23 1711 02/16/23 1715 02/16/23 1807 02/16/23 2124 02/17/23 0616  GLUCAP 51* 67*  79 86 77    Iron Studies: No results for input(s): "IRON", "TIBC", "TRANSFERRIN", "FERRITIN" in the last 72 hours. Studies/Results: US RENAL Result Date: 02/16/2023 CLINICAL DATA:  Acute kidney injury.  ATN. EXAM: RENAL / URINARY TRACT ULTRASOUND COMPLETE COMPARISON:  Ultrasound 01/13/2023. CT 10/02/2022. FINDINGS: Right Kidney: Renal measurements: 11.1 x 5.7 x 4.9 cm = volume: 162.6 mL. No collecting system dilatation or perinephric fluid. There is echogenic area towards the upper pole with a heterogeneous appearance on Doppler. No shadowing. This could be an artifact. Small high density areas seen in this location on prior CT. Left Kidney: Not well seen with overlapping bowel gas and soft tissue. Bladder: Appears normal for degree of bladder distention. Other: Study is limited overall due to bowel gas and soft tissue. IMPRESSION: Left kidney is not clearly identified on today's examination and is not evaluated. No right-sided collecting system dilatation. Study limited significantly by overlapping bowel gas and soft tissue. Electronically Signed   By: Karen Kays M.D.   On: 02/16/2023  14:30   DG Chest Portable 1 View Result Date: 02/15/2023 CLINICAL DATA:  Shortness of breath. EXAM: PORTABLE CHEST 1 VIEW COMPARISON:  January 13, 2023 FINDINGS: There is stable mild to moderate severity cardiac silhouette enlargement. Mild prominence of the central pulmonary vasculature is seen without evidence of frank pulmonary edema. Low lung volumes are noted with mild bilateral infrahilar atelectasis. No pleural effusion or pneumothorax is identified. The visualized skeletal structures are unremarkable. IMPRESSION: 1. Stable cardiomegaly with mild pulmonary vascular congestion. 2. Low lung volumes with mild bilateral infrahilar atelectasis. Electronically Signed   By: Aram Candela M.D.   On: 02/15/2023 20:10   Medications: Infusions:   Scheduled Medications:  allopurinol  50 mg Oral QODAY   atorvastatin  10 mg Oral QHS   Chlorhexidine Gluconate Cloth  6 each Topical Q0600   enoxaparin (LOVENOX) injection  30 mg Subcutaneous Q24H   escitalopram  10 mg Oral q morning   furosemide  80 mg Intravenous BID   insulin aspart  0-9 Units Subcutaneous TID WC   midodrine  5 mg Oral TID WC   mirabegron ER  25 mg Oral Daily   mupirocin ointment  1 Application Nasal BID    have reviewed scheduled and prn medications.  Physical Exam: General: obese-  alert on bipap-  no current complaints Heart: RRR Lungs: CBS bilat Abdomen: obese, non tender Extremities: pitting edema     02/17/2023,10:16 AM  LOS: 1 day

## 2023-02-17 NOTE — Progress Notes (Signed)
  Progress Note   Patient: [name] [name] OZH:[id number] DOB: 1955-03-21 DOA: 02/15/2023     1 DOS: the patient was seen and examined on 02/17/2023   Assessment and Plan: Acute hypoxic hypercapnic resp failure due to volume overload  - IV lasix 80 mg bid  - Midodrine 10 mg PO tid  - Appreciate nephrology following along   AKI on CKD 3b - As above   Acute on chronic HFpEF  - IV lasix as above   DM2 - Novolog sliding scale   Gout  - Allopurinol 50 mg PO every other day   HLD  - Lipitor 10 mg PO daily   Depression  - Lexapro 10 mg PO daily       Subjective: Pt seen and examined at the bedside. Kidney function slowly improving with the IV lasix. Appreciate nephrology following along. Pt continues on with lasix/midodrine. BIPAP as needed.   Physical Exam: Vitals:   02/16/23 2200 02/17/23 0009 02/17/23 0338 02/17/23 0925  BP:  112/61 114/61   Pulse:  71 73   Resp:  18 16   Temp:  (!) 97.5 F (36.4 C) 97.7 F (36.5 C)   TempSrc:  Axillary Axillary   SpO2: 93% 93% 92% 94%  Weight:      Height:       Physical Exam HENT:     Head: Normocephalic.     Mouth/Throat:     Mouth: Mucous membranes are moist.  Cardiovascular:     Rate and Rhythm: Normal rate and regular rhythm.  Pulmonary:     Effort: Pulmonary effort is normal.  Abdominal:     Palpations: Abdomen is soft.  Musculoskeletal:     Cervical back: Neck supple.  Skin:    General: Skin is warm.  Neurological:     Mental Status: She is alert. Mental status is at baseline.  Psychiatric:        Mood and Affect: Mood normal.       Disposition: Status is: Inpatient Remains inpatient appropriate because: IV lasix   Planned Discharge Destination:  Disposition per pt's clinical progress     Time spent: 35 minutes  Author: Baron Hamper , MD 02/17/2023 10:34 AM  For on call review [email or web address].

## 2023-02-17 NOTE — Progress Notes (Signed)
   02/17/23 2124  BiPAP/CPAP/SIPAP  $ Non-Invasive Ventilator  Non-Invasive Vent Subsequent  BiPAP/CPAP/SIPAP Pt Type Adult  BiPAP/CPAP/SIPAP V60  Mask Type Full face mask  Mask Size Medium  Set Rate 14 breaths/min  Respiratory Rate 16 breaths/min  IPAP 16 cmH20  EPAP 8 cmH2O  FiO2 (%) 50 %  Minute Ventilation 10.4  Leak 26  Peak Inspiratory Pressure (PIP) 15  Tidal Volume (Vt) 668  Patient Home Equipment No  Auto Titrate No  Press High Alarm 30 cmH2O  Press Low Alarm 5 cmH2O  CPAP/SIPAP surface wiped down Yes  Oxygen Percent 50 %

## 2023-02-17 NOTE — Progress Notes (Signed)
   02/17/23 1635  BiPAP/CPAP/SIPAP  BiPAP/CPAP/SIPAP Pt Type Adult  BiPAP/CPAP/SIPAP V60  Mask Type Full face mask  Mask Size Medium  Set Rate 14 breaths/min  Respiratory Rate 23 breaths/min  IPAP (S)  16 cmH20 (pt needing more volume)  EPAP 8 cmH2O  FiO2 (%) 50 %  Minute Ventilation 4.7  Peak Inspiratory Pressure (PIP) 18  Tidal Volume (Vt) 341  Patient Home Equipment No  Auto Titrate No  Press High Alarm 30 cmH2O

## 2023-02-17 NOTE — Progress Notes (Signed)
   02/17/23 1229  BiPAP/CPAP/SIPAP  BiPAP/CPAP/SIPAP Pt Type Adult  BiPAP/CPAP/SIPAP V60  Mask Type Full face mask  Mask Size Medium  Set Rate 14 breaths/min  Respiratory Rate 16 breaths/min  IPAP 14 cmH20  EPAP 6 cmH2O  FiO2 (%) 50 %  Minute Ventilation 4.5  Leak 0  Peak Inspiratory Pressure (PIP) 14  Tidal Volume (Vt) 349  Patient Home Equipment No  Auto Titrate No  Press High Alarm 30 cmH2O  Oxygen Percent 50 %

## 2023-02-17 NOTE — Progress Notes (Signed)
RT called to bedside to place pt on bipap per MD. Pt tolerating it well at this time. RT will monitor as needed.   02/17/23 0925  BiPAP/CPAP/SIPAP  BiPAP/CPAP/SIPAP Pt Type Adult  BiPAP/CPAP/SIPAP V60  Mask Type Full face mask  Mask Size Medium  Set Rate 14 breaths/min  Respiratory Rate 14 breaths/min  IPAP 14 cmH20  EPAP 6 cmH2O  FiO2 (%) 50 %  Minute Ventilation 5.6  Leak 8  Peak Inspiratory Pressure (PIP) 14  Tidal Volume (Vt) 312  Patient Home Equipment No  Auto Titrate No  Press High Alarm 30 cmH2O

## 2023-02-18 LAB — GLUCOSE, CAPILLARY
Glucose-Capillary: 73 mg/dL (ref 70–99)
Glucose-Capillary: 63 mg/dL — ABNORMAL LOW (ref 70–99)
Glucose-Capillary: 70 mg/dL (ref 70–99)
Glucose-Capillary: 67 mg/dL — ABNORMAL LOW (ref 70–99)
Glucose-Capillary: 69 mg/dL — ABNORMAL LOW (ref 70–99)
Glucose-Capillary: 85 mg/dL (ref 70–99)

## 2023-02-18 LAB — RENAL FUNCTION PANEL
Albumin: 3.6 g/dL (ref 3.5–5.0)
Anion gap: 10 (ref 5–15)
BUN: 88 mg/dL — ABNORMAL HIGH (ref 8–23)
CO2: 32 mmol/L (ref 22–32)
Calcium: 9.8 mg/dL (ref 8.9–10.3)
Chloride: 99 mmol/L (ref 98–111)
Creatinine, Ser: 3.94 mg/dL — ABNORMAL HIGH (ref 0.44–1.00)
GFR, Estimated: 12 mL/min — ABNORMAL LOW (ref >60)
Glucose, Bld: 59 mg/dL — ABNORMAL LOW (ref 70–99)
Phosphorus: 6.6 mg/dL — ABNORMAL HIGH (ref 2.5–4.6)
Potassium: 4.3 mmol/L (ref 3.5–5.1)
Sodium: 141 mmol/L (ref 135–145)

## 2023-02-18 MED ORDER — FUROSEMIDE 10 MG/ML IJ SOLN
80.0000 mg | Freq: Two times a day (BID) | INTRAMUSCULAR | Status: DC
  Administered 2023-02-18 – 2023-02-19 (×2): 80 mg via INTRAVENOUS
  Filled 2023-02-18 (×2): qty 8

## 2023-02-18 NOTE — Progress Notes (Signed)
Patient blood glucose to 63 this am night shift nurse in room  with this nurse to try and administer glucose correction with 2 cups of OJ patient refusing to drink juice educated on risks of this decision. Son at bedside trying to encourage po intake

## 2023-02-18 NOTE — Progress Notes (Signed)
[facility] PROGRESS NOTE  NICOLLE HUSSONG (DOB: 10-31-1955) JIR:[id number] PCP: [name]  Brief Narrative: [name] [name] is a 67 y.o. female with a history of morbid obesity (BMI 55), OSA/OHS nonadherent to BiPAP, HTN, T2DM, stage IIIb CKD, functionally bed bound, multiple readmissions who presented to the ED on 02/15/2023 with confusion found to be severely hypoxemic, placed on nonrebreather, blood glucose reportedly 18, pH 7.18, pCO2 85.3. Cr 5.5 with K of 6.2.   Subjective: No new complaints.   Objective: BP (!) 143/78 (BP Location: Left Wrist)   Pulse 75   Temp (!) 97.4 F (36.3 C) (Axillary)   Resp 18   Ht 5\' 2"  (1.575 m)   Wt (!) 137.5 kg   SpO2 96%   BMI 55.44 kg/m   Gen: Morbidly obese female in no distress Pulm: Distant, nonlabored  CV: RRR, +edema GI: Soft, NT, ND, +BS Neuro: Alert and oriented. No new focal deficits. Ext: Warm, no deformities. Skin: No new rashes, lesions or ulcers on visualized skin   Assessment & Plan: Acute hypoxic hypercapnic resp failure due to acute on chronic HFpEF:  - Continue lasix 80mg  IV BID. Urine output may not have been reliably documented earlier, though today it is robust. continue monitoring.  - Continue midodrine 10mg  TID support.  - Appreciate nephrology assistance here.    AKI on stage IIIb CKD: SCr coming down with diuresis. No obstruction noted on a limited U/S, UA bland sediment with +bacteriuria. No leukocytosis or fever or urinary complaints. -  Will monitor daily, avoid nephrotoxins. Again, appreciate nephrology assistance here.   Hyperkalemia:  - Resolved.   T2DM: With hypoglycemia.  - DC basal insulin - Continue SSI though needing to use hypoglycemia protocols despite HbA1c recently 9.2% and no insulin given, still and BP soft, will check cortisol in AM    Gout  - Continue allopurinol   HLD  - Continue atorvastatin     Depression  - Continue SSRI   Tyrone Nine, MD [facility] [email or web address] 02/18/2023, 4:36 PM

## 2023-02-18 NOTE — Progress Notes (Signed)
Subjective:  UOP 2100-  BUN and crt trending down since admission-  she is a little more difficult to arouse this am but once I do she is alert - able to participate in pleasant conversation but unclear how much she understands-  on bipap still-  I guess desats off of it per nursing  Objective Vital signs in last 24 hours: Vitals:   02/18/23 0429 02/18/23 0642 02/18/23 0752 02/18/23 0840  BP: 90/64  121/65 121/65  Pulse: 68   75  Resp: 16     Temp: (!) 97.4 F (36.3 C)  (!) 97.3 F (36.3 C)   TempSrc: Axillary  Oral   SpO2: 95% 95%  94%  Weight:      Height:       Weight change:   Intake/Output Summary (Last 24 hours) at 02/18/2023 0935 Last data filed at 02/18/2023 3244 Gross per 24 hour  Intake --  Output 2100 ml  Net -2100 ml    Assessment/ Plan: Pt is a 67 y.o. yo female with obesity, DM, HTN, osa, bedbound who was admitted on 02/15/2023 with increasing SOB, hypoxia and A on CRF  Assessment/Plan: 1. Renal-  A on CRF-  able to find out baseline crt 1.6-2-  now with A on CRF 5.5 on presentation- urine bland- no obvious obstruction on [place] although left kidney not visualized well.  Thinking is hemodynamic issue with heart failure.  Renal function has improved last 48 hours-  reasonable urine on lasix 80 q 12-  soft BP on max  midodrine -  will not change lasix today and follow.  No dialysis indications at present 2. HTN/volume-  seems overloaded-  on iv lasix as above, to continue - not robust UOP but unclear if all measured 3. Anemia- not an issue right now  4. pulm-  hypoxia and OSA-  on bipap-  mentation is improved -  iv lasix for pulmonary edema -  will continue 5. Hyperkalemia-  resolved    Cecille Aver    Labs: Basic Metabolic Panel: Recent Labs  Lab 02/16/23 0322 02/17/23 0319 02/18/23 0308  NA 137 139 141  K 5.0 4.6 4.3  CL 94* 96* 99  CO2 28 29 32  GLUCOSE 64* 63* 59*  BUN 98* 93* 88*  CREATININE 5.54* 4.80* 3.94*  CALCIUM 9.1 9.1 9.8  PHOS  --   7.3* 6.6*   Liver Function Tests: Recent Labs  Lab 02/15/23 2058 02/17/23 0319 02/18/23 0308  AST 32 17  --   ALT 42 36  --   ALKPHOS 210* 191*  --   BILITOT 1.5* 1.0  --   PROT 8.3* 8.2*  --   ALBUMIN 3.8 3.8 3.6   Recent Labs  Lab 02/16/23 0322  LIPASE 30   No results for input(s): "AMMONIA" in the last 168 hours. CBC: Recent Labs  Lab 02/15/23 1949 02/15/23 1956 02/15/23 2136 02/16/23 0322 02/17/23 0319  WBC 4.9  --   --  4.9 4.6  NEUTROABS 3.7  --   --   --  2.7  HGB 15.1*   < > 18.0* 15.7* 14.8  HCT 55.2*   < > 53.0* 56.9* 52.7*  MCV 83.9  --   --  84.3 82.6  PLT 280  --   --  273 271   < > = values in this interval not displayed.   Cardiac Enzymes: No results for input(s): "CKTOTAL", "CKMB", "CKMBINDEX", "TROPONINI" in the last 168 hours. CBG: Recent Labs  Lab 02/17/23 1822 02/17/23 2112 02/18/23 0425 02/18/23 0653 02/18/23 0758  GLUCAP 76 98 73 63* 70    Iron Studies: No results for input(s): "IRON", "TIBC", "TRANSFERRIN", "FERRITIN" in the last 72 hours. Studies/Results: US RENAL Result Date: 02/16/2023 CLINICAL DATA:  Acute kidney injury.  ATN. EXAM: RENAL / URINARY TRACT ULTRASOUND COMPLETE COMPARISON:  Ultrasound 01/13/2023. CT 10/02/2022. FINDINGS: Right Kidney: Renal measurements: 11.1 x 5.7 x 4.9 cm = volume: 162.6 mL. No collecting system dilatation or perinephric fluid. There is echogenic area towards the upper pole with a heterogeneous appearance on Doppler. No shadowing. This could be an artifact. Small high density areas seen in this location on prior CT. Left Kidney: Not well seen with overlapping bowel gas and soft tissue. Bladder: Appears normal for degree of bladder distention. Other: Study is limited overall due to bowel gas and soft tissue. IMPRESSION: Left kidney is not clearly identified on today's examination and is not evaluated. No right-sided collecting system dilatation. Study limited significantly by overlapping bowel gas and  soft tissue. Electronically Signed   By: Karen Kays M.D.   On: 02/16/2023 14:30   Medications: Infusions:   Scheduled Medications:  allopurinol  50 mg Oral QODAY   atorvastatin  10 mg Oral QHS   Chlorhexidine Gluconate Cloth  6 each Topical Q0600   enoxaparin (LOVENOX) injection  30 mg Subcutaneous Q24H   escitalopram  10 mg Oral q morning   insulin aspart  0-9 Units Subcutaneous TID WC   midodrine  10 mg Oral TID WC   mirabegron ER  25 mg Oral Daily   mupirocin ointment  1 Application Nasal BID    have reviewed scheduled and prn medications.  Physical Exam: General: obese-  alert on bipap-  no current complaints Heart: RRR Lungs: CBS bilat Abdomen: obese, non tender Extremities: pitting edema     02/18/2023,9:35 AM  LOS: 2 days

## 2023-02-18 NOTE — Plan of Care (Signed)
  Problem: Coping: Goal: Ability to adjust to condition or change in health will improve Outcome: Not Progressing   Problem: Nutritional: Goal: Maintenance of adequate nutrition will improve Outcome: Not Progressing

## 2023-02-19 DIAGNOSIS — Z7189 Other specified counseling: Secondary | ICD-10-CM

## 2023-02-19 DIAGNOSIS — Z515 Encounter for palliative care: Secondary | ICD-10-CM

## 2023-02-19 LAB — GLUCOSE, CAPILLARY
Glucose-Capillary: 83 mg/dL (ref 70–99)
Glucose-Capillary: 128 mg/dL — ABNORMAL HIGH (ref 70–99)
Glucose-Capillary: 146 mg/dL — ABNORMAL HIGH (ref 70–99)
Glucose-Capillary: 157 mg/dL — ABNORMAL HIGH (ref 70–99)

## 2023-02-19 LAB — RENAL FUNCTION PANEL
Albumin: 3.3 g/dL — ABNORMAL LOW (ref 3.5–5.0)
Anion gap: 12 (ref 5–15)
BUN: 78 mg/dL — ABNORMAL HIGH (ref 8–23)
CO2: 32 mmol/L (ref 22–32)
Calcium: 9.5 mg/dL (ref 8.9–10.3)
Chloride: 99 mmol/L (ref 98–111)
Creatinine, Ser: 3.27 mg/dL — ABNORMAL HIGH (ref 0.44–1.00)
GFR, Estimated: 15 mL/min — ABNORMAL LOW (ref >60)
Glucose, Bld: 77 mg/dL (ref 70–99)
Phosphorus: 4.9 mg/dL — ABNORMAL HIGH (ref 2.5–4.6)
Potassium: 4 mmol/L (ref 3.5–5.1)
Sodium: 143 mmol/L (ref 135–145)

## 2023-02-19 LAB — CORTISOL-AM, BLOOD: Cortisol - AM: 20.4 ug/dL (ref 6.7–22.6)

## 2023-02-19 MED ORDER — POLYETHYLENE GLYCOL 3350 17 G PO PACK
17.0000 g | PACK | Freq: Every day | ORAL | Status: DC
Start: 2023-02-20 — End: 2023-02-23
  Administered 2023-02-21 – 2023-02-22 (×2): 17 g via ORAL
  Filled 2023-02-19 (×3): qty 1

## 2023-02-19 MED ORDER — FUROSEMIDE 10 MG/ML IJ SOLN
80.0000 mg | Freq: Two times a day (BID) | INTRAMUSCULAR | Status: DC
  Administered 2023-02-19 – 2023-02-20 (×2): 80 mg via INTRAVENOUS
  Filled 2023-02-19 (×2): qty 8

## 2023-02-19 NOTE — Progress Notes (Signed)
Patient awoke and removed BiPap. Nurse encouraged patient to continue the BiPap but patient refused. Placed the patient on a nasal cannula at 4 L.  O2 sats at 94%.  Offered the patient water which she refused.  Will continue to monitor.  Harriet Masson, RN

## 2023-02-19 NOTE — Progress Notes (Signed)
PROGRESS NOTE    [name] [name]  KGM:[id number] DOB: 04/21/1955 DOA: 02/15/2023 PCP: [name]  67 y.o. female with a history of morbid obesity (BMI 55), OSA/OHS nonadherent to BiPAP, HTN, T2DM, stage IIIb CKD, functionally bed bound, multiple readmissions who presented to the ED on 02/15/2023 with confusion found to be severely hypoxemic, volume overloaded, placed on nonrebreather, blood glucose reportedly 18, pH 7.18, pCO2 85.3. Cr 5.5 with K of 6.2.  -Admitted, started on diuretics and BiPAP, nephrology following, kidney function improving  Subjective: Feels fair, used BiPAP only for few hours last night  Assessment and Plan:  Acute hypoxic hypercapnic resp failure  Acute on chronic HFpEF:, Suspected RV failure -Echo 11/24 significantly limited study, poor acoustic windows, EF 55%, RV could not be visualized  -Remains volume overloaded, continue Lasix 80 Mg twice daily, 5 L negative  -GDMT limited by CKD 4  - Continue midodrine 10mg  TID support.  - Appreciate nephrology assistance    AKI on stage IIIb CKD: Hyperphosphatemia -Likely cardiorenal, baseline creatinine around 1.6-2, on admission creatinine was 5.5 -Ultrasound without hydronephrosis -Improving with diuresis, continue Lasix, GI following -Phos levels improving   Hyperkalemia:  - Resolved.    T2DM: With hypoglycemia.  - DC basal insulin - Continue SSI though needing to use hypoglycemia protocols despite HbA1c recently 9.2%  Likely worsened in the setting of AKI   Gout  - Continue allopurinol   HLD  - Continue atorvastatin     Depression  - Continue SSRI   Morbid obesity OSA OHS Continue nightly BiPAP here, on CPAP nightly at home with poor compliance  Debility, bedbound, nonambulatory -Has CNA's at home, [facility] lift used, bedbound   DVT prophylaxis: Lovenox Code Status: Full code Family Communication: None present Disposition Plan: Home likely 2 to 3 days  Consultants:     Procedures:   Antimicrobials:    Objective: Vitals:   02/18/23 2317 02/19/23 0130 02/19/23 0400 02/19/23 0820  BP: (!) 171/91  (!) 150/68 (!) 110/56  Pulse: 72 76 80   Resp: 14 14 14 15   Temp: 98 F (36.7 C)  97.8 F (36.6 C) 98.1 F (36.7 C)  TempSrc: Axillary  Oral Oral  SpO2: 94% 94% 92%   Weight:      Height:        Intake/Output Summary (Last 24 hours) at 02/19/2023 1113 Last data filed at 02/19/2023 1054 Gross per 24 hour  Intake 200 ml  Output 2900 ml  Net -2700 ml   Filed Weights   02/15/23 1924  Weight: (!) 137.5 kg    Examination:  General exam: Morbidly obese chronically ill female laying in bed, AAOx3 HEENT: Neck obese unable to assess JVD CVS: S1-S2, regular rhythm Lungs: Distant breath sounds Abdomen: Soft, nontender, bowel sounds present Extremities: Trace edema, significantly obese  skin: No rashes Psychiatry:  Mood & affect appropriate.     Data Reviewed:   CBC: Recent Labs  Lab 02/15/23 1949 02/15/23 1956 02/15/23 2136 02/16/23 0322 02/17/23 0319  WBC 4.9  --   --  4.9 4.6  NEUTROABS 3.7  --   --   --  2.7  HGB 15.1* 19.4* 18.0* 15.7* 14.8  HCT 55.2* 57.0* 53.0* 56.9* 52.7*  MCV 83.9  --   --  84.3 82.6  PLT 280  --   --  273 271   Basic Metabolic Panel: Recent Labs  Lab 02/15/23 2058 02/15/23 2136 02/16/23 0322 02/17/23 0319 02/18/23 0308 02/19/23 0239  NA 137  136 137 139 141 143  K 6.2* 5.3* 5.0 4.6 4.3 4.0  CL 95*  --  94* 96* 99 99  CO2 23  --  28 29 32 32  GLUCOSE 73  --  64* 63* 59* 77  BUN 100*  --  98* 93* 88* 78*  CREATININE 5.55*  --  5.54* 4.80* 3.94* 3.27*  CALCIUM 9.0  --  9.1 9.1 9.8 9.5  MG  --   --   --  3.8*  --   --   PHOS  --   --   --  7.3* 6.6* 4.9*   GFR: Estimated Creatinine Clearance: 21.8 mL/min (A) (by C-G formula based on SCr of 3.27 mg/dL (H)). Liver Function Tests: Recent Labs  Lab 02/15/23 2058 02/17/23 0319 02/18/23 0308 02/19/23 0239  AST 32 17  --   --   ALT 42 36  --    --   ALKPHOS 210* 191*  --   --   BILITOT 1.5* 1.0  --   --   PROT 8.3* 8.2*  --   --   ALBUMIN 3.8 3.8 3.6 3.3*   Recent Labs  Lab 02/16/23 0322  LIPASE 30   No results for input(s): "AMMONIA" in the last 168 hours. Coagulation Profile: No results for input(s): "INR", "PROTIME" in the last 168 hours. Cardiac Enzymes: No results for input(s): "CKTOTAL", "CKMB", "CKMBINDEX", "TROPONINI" in the last 168 hours. BNP (last 3 results) No results for input(s): "PROBNP" in the last 8760 hours. HbA1C: No results for input(s): "HGBA1C" in the last 72 hours. CBG: Recent Labs  Lab 02/18/23 0758 02/18/23 1223 02/18/23 1625 02/18/23 2113 02/19/23 0616  GLUCAP 70 67* 69* 85 83   Lipid Profile: No results for input(s): "CHOL", "HDL", "LDLCALC", "TRIG", "CHOLHDL", "LDLDIRECT" in the last 72 hours. Thyroid Function Tests: No results for input(s): "TSH", "T4TOTAL", "FREET4", "T3FREE", "THYROIDAB" in the last 72 hours. Anemia Panel: No results for input(s): "VITAMINB12", "FOLATE", "FERRITIN", "TIBC", "IRON", "RETICCTPCT" in the last 72 hours. Urine analysis:    Component Value Date/Time   COLORURINE YELLOW 02/16/2023 0800   APPEARANCEUR CLEAR 02/16/2023 0800   LABSPEC 1.012 02/16/2023 0800   PHURINE 5.0 02/16/2023 0800   GLUCOSEU NEGATIVE 02/16/2023 0800   HGBUR NEGATIVE 02/16/2023 0800   BILIRUBINUR NEGATIVE 02/16/2023 0800   KETONESUR NEGATIVE 02/16/2023 0800   PROTEINUR 30 (A) 02/16/2023 0800   UROBILINOGEN 0.2 06/28/2014 1500   NITRITE NEGATIVE 02/16/2023 0800   LEUKOCYTESUR NEGATIVE 02/16/2023 0800   Sepsis Labs: @LABRCNTIP (procalcitonin:4,lacticidven:4)  ) Recent Results (from the past 240 hours)  Resp panel by RT-PCR (RSV, Flu A&B, Covid) Anterior Nasal Swab     Status: None   Collection Time: 02/15/23  8:15 PM   Specimen: Anterior Nasal Swab  Result Value Ref Range Status   SARS Coronavirus 2 by RT PCR NEGATIVE NEGATIVE Final   Influenza A by PCR NEGATIVE NEGATIVE  Final   Influenza B by PCR NEGATIVE NEGATIVE Final    Comment: (NOTE) The Xpert Xpress SARS-CoV-2/FLU/RSV plus assay is intended as an aid in the diagnosis of influenza from Nasopharyngeal swab specimens and should not be used as a sole basis for treatment. Nasal washings and aspirates are unacceptable for Xpert Xpress SARS-CoV-2/FLU/RSV testing.  Fact Sheet for Patients: BloggerCourse.com  Fact Sheet for Healthcare Providers: SeriousBroker.it  This test is not yet approved or cleared by the [place] FDA and has been authorized for detection and/or diagnosis of SARS-CoV-2 by FDA under an Emergency Use  Authorization (EUA). This EUA will remain in effect (meaning this test can be used) for the duration of the COVID-19 declaration under Section 564(b)(1) of the Act, 21 U.S.C. section 360bbb-3(b)(1), unless the authorization is terminated or revoked.     Resp Syncytial Virus by PCR NEGATIVE NEGATIVE Final    Comment: (NOTE) Fact Sheet for Patients: BloggerCourse.com  Fact Sheet for Healthcare Providers: SeriousBroker.it  This test is not yet approved or cleared by the [place] FDA and has been authorized for detection and/or diagnosis of SARS-CoV-2 by FDA under an Emergency Use Authorization (EUA). This EUA will remain in effect (meaning this test can be used) for the duration of the COVID-19 declaration under Section 564(b)(1) of the Act, 21 U.S.C. section 360bbb-3(b)(1), unless the authorization is terminated or revoked.  Performed at [facility], [address], [place], [place] [address]   MRSA Next Gen by PCR, Nasal     Status: Abnormal   Collection Time: 02/16/23  9:58 AM   Specimen: Nasal Mucosa; Nasal Swab  Result Value Ref Range Status   MRSA by PCR Next Gen DETECTED (A) NOT DETECTED Final    Comment: RESULT CALLED TO, READ BACK BY AND VERIFIED  WITH: RN VERONICA B 1440 [id number] FCP (NOTE) The GeneXpert MRSA Assay (FDA approved for NASAL specimens only), is one component of a comprehensive MRSA colonization surveillance program. It is not intended to diagnose MRSA infection nor to guide or monitor treatment for MRSA infections. Test performance is not FDA approved in patients less than 28 years old. Performed at [facility], [address], [place], [place] [address]      Radiology Studies: No results found.   Scheduled Meds:  allopurinol  50 mg Oral QODAY   atorvastatin  10 mg Oral QHS   Chlorhexidine Gluconate Cloth  6 each Topical Q0600   enoxaparin (LOVENOX) injection  30 mg Subcutaneous Q24H   escitalopram  10 mg Oral q morning   furosemide  80 mg Intravenous Q12H   insulin aspart  0-9 Units Subcutaneous TID WC   midodrine  10 mg Oral TID WC   mirabegron ER  25 mg Oral Daily   mupirocin ointment  1 Application Nasal BID   Continuous Infusions:   LOS: 3 days    Time spent:    Zannie Cove, MD [facility]   02/19/2023, 11:13 AM

## 2023-02-19 NOTE — Consult Note (Signed)
Palliative Medicine Inpatient Consult Note  Consulting Provider: Dorcas Carrow, MD   Reason for consult:   Palliative Care Consult Services Palliative Medicine Consult  Reason for Consult? goal of care   02/19/2023  HPI:  Per intake H&P --> [name] [name] is a 67 y.o. female with a history of morbid obesity (BMI 55), OSA/OHS nonadherent to BiPAP, HTN, T2DM, stage IIIb CKD, functionally bed bound, multiple readmissions who presented to the ED on 02/15/2023 with confusion found to be severely hypoxemic in the setting of heart failure. Has been on diuresis and improving gradually. The Palliative medicine team has been asked to get involved to further discuss goals of care.   Clinical Assessment/Goals of Care:  *Please note that this is a verbal dictation therefore any spelling or grammatical errors are due to the "Dragon Medical One" system interpretation.  I have reviewed medical records including EPIC notes, labs and imaging, received report from bedside RN, assessed the patient who is lying in bed in NAD.    I met with [name] [name] to further discuss diagnosis prognosis, GOC, EOL wishes, disposition and options.   I introduced Palliative Medicine as specialized medical care for people living with serious illness. It focuses on providing relief from the symptoms and stress of a serious illness. The goal is to improve quality of life for both the patient and the family.  Medical History Review and Understanding:  I reviewed [name] [name]'s past medical history inclusive of hypertension, type 2 diabetes, chronic kidney disease, heart failure, morbid obesity causing her to be bedbound, and obstructive sleep apnea.  Social History:  [name] [name] is from [place] originally.  She shares that she is lived in [place] for which she thinks is roughly 50 years now.  She is not married.  [name] [name] shares that she has 1 son, [name] [name], 4 grandchildren, and 4 great-grandchildren.  She  shares with me that she used to love preparing food for parties-cooking is one of her passions.  She formally worked at [employer].  She is a woman of faith and practices within the [facility] denomination.  Functional and Nutritional State:  Edye lives in an apartment.  She shares with me that she is bedbound but has to CNA's who come in the morning and afternoon to help her with all activities of daily living and meal preparation.  She expresses that she does have a lift in her home should she need to be moved. Petrice shares that she does have a good appetite and does not note she has lost any large amount of weight recently.  Advance Directives:  A detailed discussion was had today regarding advanced directives.  [name] [name] has never completed advanced directives though shares with me that she would be interested in doing this while hospitalized.  I did provide her with advanced directive paperwork and she was able to fill in the first part dedicating her son [name] [name] to be her surrogate decision maker.  When we tried to complete the living will section together she became easily confused therefore we stopped and she I shared we tried again tomorrow.  Code Status:  Concepts specific to code status, artifical feeding and hydration, continued IV antibiotics and rehospitalization was had.  The difference between a aggressive medical intervention path  and a palliative comfort care path for this patient at this time was had.   [name] shares that she would want full resuscitative efforts to live.  She shares that if the rest of her  life remained bedbound with the level of coherence as she is now that would be acceptable to her.  Discussion:  [name] and I reviewed her reason for hospitalization inclusive of her hypoxic event in the setting of her vascular congestion.  I asked her if she understood that she has heart failure and what that means in the short  and long-term.  She shared that she does not understand what heart failure is.  [name] and I discussed her chronic disease conditions inclusive of her heart failure. We further reviewed the progressive nature of this disease inclusive of the New York Heart Association functional classifications.  We reviewed that even with appropriate medical treatment this disease over time will progress.  [name] provided regarding return to physician warning signs indicating exacerbation of heart failure.  We further reviewed patient's kidney function and disease and how volume overload can adversely affect the way that her kidneys are functioning.  Discussed cardiorenal syndrome as a concomitant decreased kidney function, therapy-resistant heart failure with congestion (ie, diuretic resistance), and worsening kidney function during heart failure therapy.  We reviewed the fragile balance that must be made by both the cardiology team as well as the nephrology team.  Discussed since obstructive sleep apnea and the burden not being consistent with BiPAP has on near physical state and cognitive.  Reinforced the importance of using BiPAP as prescribed.  Reviewed the options moving forward.  I strongly advocated for outpatient palliative support given patient's present clinical state and the likelihood for further clinical decline once discharged.  [name] is in agreement with outpatient palliative support at this time.  [name] does agree to my calling her son to update him on her conversation with myself.  Discussed the importance of continued conversation with family and their  medical providers regarding overall plan of care and treatment options, ensuring decisions are within the context of the patients values and GOCs. ______________________ Addendum:  A phone call was made to patient's son [name] [name] mostly and HIPAA compliant voicemail was left.  Awaiting a return call.  Decision Maker: [name] [name] (Son):  [phone] (Mobile)   SUMMARY OF RECOMMENDATIONS   Full Code/Full Scope of Care  Chaplain to help with advanced care planning documents  Discussed patients chronic heart failure and kidney disease, reviewed the progressive nature of both disease processes  Discussed patient's OSA and the importance of using BiPAP as prescribed  Plan for OP Palliative support on discharge  The Palliative Medicine Team will continue to follow along  Code Status/Advance Care Planning: FULL CODE   Palliative Prophylaxis:  Aspiration, Bowel Regimen, Delirium Protocol, Frequent Pain Assessment, Oral Care, Palliative Wound Care, and Turn Reposition  Additional Recommendations (Limitations, Scope, Preferences): Continue current care  Psycho-social/Spiritual:  Desire for further Chaplaincy support: [employer] Additional Recommendations: Education on chronic disease burden such as heart failure and chronic kidney disease   Prognosis: Patient has had 3 admissions in 6 months and has high chronic comorbid burden placing her at a higher 62-month mortality risk  Discharge Planning: Discharge plan will likely be to home with home health and outpatient palliative support.  Patient already has CNA's to come in daily to provide assistance with BADLs.  Vitals:   02/19/23 0130 02/19/23 0400  BP:  (!) 150/68  Pulse: 76 80  Resp: 14 14  Temp:  97.8 F (36.6 C)  SpO2: 94% 92%    Intake/Output Summary (Last 24 hours) at 02/19/2023 0725 Last data filed at 02/19/2023 0340 Gross per 24 hour  Intake  200 ml  Output 2150 ml  Net -1950 ml   Last Weight  Most recent update: 02/15/2023  7:24 PM    Weight  137.5 kg (303 lb 2.1 oz)              Gen: Elderly obese African-American female chronically ill-appearing HEENT: moist mucous membranes CV: Regular rate and rhythm  PULM: On 4 L nasal cannula breathing is even and unlabored ABD: soft/nontender  EXT: (+) edema  Neuro: Alert and  oriented x2 -patient not clear on situation and intermittently dosing off  PPS: 30%   This conversation/these recommendations were discussed with patient primary care team, Dr. Jomarie Longs  Billing based on MDM: High  Problems Addressed: One acute or chronic illness or injury that poses a threat to life or bodily function  Amount and/or Complexity of Data: Category 3:Discussion of management or test interpretation with external physician/other qualified health care professional/appropriate source (not separately reported)  Risks: Decision regarding hospitalization or escalation of hospital care and Decision not to resuscitate or to de-escalate care because of poor prognosis ______________________________________________________ [name] [facility] Palliative Medicine Team Team Cell Phone: [phone] Please utilize secure chat with additional questions, if there is no response within 30 minutes please call the above phone number  Palliative Medicine Team providers are available by phone from 7am to 7pm daily and can be reached through the team cell phone.  Should this patient require assistance outside of these hours, please call the patient's attending physician.

## 2023-02-19 NOTE — Progress Notes (Signed)
Subjective:  UOP 2100 again -  BUN and crt trending down since admission-  she is alert off bipap-  seems confused-  keeps asking me what room she is in-  not sure of baseline  Objective Vital signs in last 24 hours: Vitals:   02/18/23 2317 02/19/23 0130 02/19/23 0400 02/19/23 0820  BP: (!) 171/91  (!) 150/68 (!) 110/56  Pulse: 72 76 80   Resp: 14 14 14 15   Temp: 98 F (36.7 C)  97.8 F (36.6 C) 98.1 F (36.7 C)  TempSrc: Axillary  Oral Oral  SpO2: 94% 94% 92%   Weight:      Height:       Weight change:   Intake/Output Summary (Last 24 hours) at 02/19/2023 4098 Last data filed at 02/19/2023 0340 Gross per 24 hour  Intake 200 ml  Output 2150 ml  Net -1950 ml    Assessment/ Plan: Pt is a 67 y.o. yo female with obesity, DM, HTN, osa, bedbound who was admitted on 02/15/2023 with increasing SOB, hypoxia and A on CRF  Assessment/Plan: 1. Renal-  A on CRF-  able to find out baseline crt 1.6-2-  now with A on CRF 5.5 on presentation- urine bland- no obvious obstruction on [place] although left kidney not visualized well.  Thinking is hemodynamic issue with heart failure.  Renal function has improved last 72 hours-  reasonable urine on lasix 80 q 12-  previous soft BP on max  midodrine - now BP is improved-  will not change lasix today and follow.  No dialysis indications  2. HTN/volume-  seems overloaded-  on iv lasix as above, to continue - not robust UOP but unclear if all measured-  BP now is better-  even high at times- put parameters on midocrine and may be able to stop  3. Anemia- not an issue right now  4. pulm-  hypoxia and OSA-  on bipap-  mentation  improved form admit but still confused ( dont know baseline) -  iv lasix for pulmonary edema -  will continue 5. Hyperkalemia-  resolved    Cecille Aver    Labs: Basic Metabolic Panel: Recent Labs  Lab 02/17/23 0319 02/18/23 0308 02/19/23 0239  NA 139 141 143  K 4.6 4.3 4.0  CL 96* 99 99  CO2 29 32 32  GLUCOSE 63*  59* 77  BUN 93* 88* 78*  CREATININE 4.80* 3.94* 3.27*  CALCIUM 9.1 9.8 9.5  PHOS 7.3* 6.6* 4.9*   Liver Function Tests: Recent Labs  Lab 02/15/23 2058 02/17/23 0319 02/18/23 0308 02/19/23 0239  AST 32 17  --   --   ALT 42 36  --   --   ALKPHOS 210* 191*  --   --   BILITOT 1.5* 1.0  --   --   PROT 8.3* 8.2*  --   --   ALBUMIN 3.8 3.8 3.6 3.3*   Recent Labs  Lab 02/16/23 0322  LIPASE 30   No results for input(s): "AMMONIA" in the last 168 hours. CBC: Recent Labs  Lab 02/15/23 1949 02/15/23 1956 02/15/23 2136 02/16/23 0322 02/17/23 0319  WBC 4.9  --   --  4.9 4.6  NEUTROABS 3.7  --   --   --  2.7  HGB 15.1*   < > 18.0* 15.7* 14.8  HCT 55.2*   < > 53.0* 56.9* 52.7*  MCV 83.9  --   --  84.3 82.6  PLT 280  --   --  273 271   < > = values in this interval not displayed.   Cardiac Enzymes: No results for input(s): "CKTOTAL", "CKMB", "CKMBINDEX", "TROPONINI" in the last 168 hours. CBG: Recent Labs  Lab 02/18/23 0758 02/18/23 1223 02/18/23 1625 02/18/23 2113 02/19/23 0616  GLUCAP 70 67* 69* 85 83    Iron Studies: No results for input(s): "IRON", "TIBC", "TRANSFERRIN", "FERRITIN" in the last 72 hours. Studies/Results: No results found.  Medications: Infusions:   Scheduled Medications:  allopurinol  50 mg Oral QODAY   atorvastatin  10 mg Oral QHS   Chlorhexidine Gluconate Cloth  6 each Topical Q0600   enoxaparin (LOVENOX) injection  30 mg Subcutaneous Q24H   escitalopram  10 mg Oral q morning   furosemide  80 mg Intravenous BID   insulin aspart  0-9 Units Subcutaneous TID WC   midodrine  10 mg Oral TID WC   mirabegron ER  25 mg Oral Daily   mupirocin ointment  1 Application Nasal BID    have reviewed scheduled and prn medications.  Physical Exam: General: obese-  alert   no current complaints-  slight confusion  Heart: RRR Lungs: CBS bilat Abdomen: obese, non tender Extremities: pitting edema     02/19/2023,9:03 AM  LOS: 3 days

## 2023-02-20 DIAGNOSIS — Z7189 Other specified counseling: Secondary | ICD-10-CM | POA: Diagnosis not present

## 2023-02-20 DIAGNOSIS — Z515 Encounter for palliative care: Secondary | ICD-10-CM | POA: Diagnosis not present

## 2023-02-20 LAB — RENAL FUNCTION PANEL
Albumin: 3.3 g/dL — ABNORMAL LOW (ref 3.5–5.0)
Anion gap: 8 (ref 5–15)
BUN: 71 mg/dL — ABNORMAL HIGH (ref 8–23)
CO2: 34 mmol/L — ABNORMAL HIGH (ref 22–32)
Calcium: 9.3 mg/dL (ref 8.9–10.3)
Chloride: 96 mmol/L — ABNORMAL LOW (ref 98–111)
Creatinine, Ser: 2.6 mg/dL — ABNORMAL HIGH (ref 0.44–1.00)
GFR, Estimated: 19 mL/min — ABNORMAL LOW (ref >60)
Glucose, Bld: 109 mg/dL — ABNORMAL HIGH (ref 70–99)
Phosphorus: 3.6 mg/dL (ref 2.5–4.6)
Potassium: 3.7 mmol/L (ref 3.5–5.1)
Sodium: 138 mmol/L (ref 135–145)

## 2023-02-20 LAB — GLUCOSE, CAPILLARY
Glucose-Capillary: 136 mg/dL — ABNORMAL HIGH (ref 70–99)
Glucose-Capillary: 145 mg/dL — ABNORMAL HIGH (ref 70–99)
Glucose-Capillary: 117 mg/dL — ABNORMAL HIGH (ref 70–99)
Glucose-Capillary: 118 mg/dL — ABNORMAL HIGH (ref 70–99)

## 2023-02-20 MED ORDER — EYE WASH OP SOLN
1.0000 [drp] | OPHTHALMIC | Status: DC | PRN
  Administered 2023-02-20: 1 [drp] via OPHTHALMIC
  Filled 2023-02-20: qty 118

## 2023-02-20 MED ORDER — TORSEMIDE 20 MG PO TABS
80.0000 mg | ORAL_TABLET | Freq: Two times a day (BID) | ORAL | Status: DC
  Administered 2023-02-20 – 2023-02-22 (×5): 80 mg via ORAL
  Filled 2023-02-20 (×5): qty 4

## 2023-02-20 MED ORDER — MIDODRINE HCL 5 MG PO TABS
5.0000 mg | ORAL_TABLET | Freq: Three times a day (TID) | ORAL | Status: DC
  Administered 2023-02-20 – 2023-02-23 (×7): 5 mg via ORAL
  Filled 2023-02-20 (×8): qty 1

## 2023-02-20 NOTE — Progress Notes (Signed)
[facility] Progress Note   Assessment/ Plan:   Pt is a 67 y.o. yo female with obesity, DM, HTN, osa, bedbound who was admitted on 02/15/2023 with increasing SOB, hypoxia and A on CRF  Assessment/Plan: 1. Renal-  A on CRF-  able to find out baseline crt 1.6-2-  now with A on CRF 5.5 on presentation- urine bland- no obvious obstruction on [place] although left kidney not visualized well.  Thinking is hemodynamic issue with heart failure.  Renal function has improved  - switch IV Lasix to PO torsemide  2. HTN/volume: decrease midodrine to 5 TID 3. Anemia- not an issue right now  4. pulm-  hypoxia and OSA-  on bipap-  mentation  improved form admit but still confused ( dont know baseline)  5. Hyperkalemia-  resolved   Subjective:    UOP good, Cr coming down and K normalized.  Off BiPaP, talking to her sister on the phone.     Objective:   BP (!) 151/62 (BP Location: Left Wrist)   Pulse 79   Temp 98.1 F (36.7 C) (Oral)   Resp 20   Ht 5\' 2"  (1.575 m)   Wt (!) 137.5 kg   SpO2 90%   BMI 55.44 kg/m   Intake/Output Summary (Last 24 hours) at 02/20/2023 1116 Last data filed at 02/20/2023 0808 Gross per 24 hour  Intake 340 ml  Output 1750 ml  Net -1410 ml   Weight change:   Physical Exam: Gen:NAD, lying in bed CVS: RRR Resp: clear Abd: soft Ext: no LE edema, skin texture present  Imaging: No results found.  Labs: BMET Recent Labs  Lab 02/15/23 2058 02/15/23 2136 02/16/23 0322 02/17/23 0319 02/18/23 0308 02/19/23 0239 02/20/23 0303  NA 137 136 137 139 141 143 138  K 6.2* 5.3* 5.0 4.6 4.3 4.0 3.7  CL 95*  --  94* 96* 99 99 96*  CO2 23  --  28 29 32 32 34*  GLUCOSE 73  --  64* 63* 59* 77 109*  BUN 100*  --  98* 93* 88* 78* 71*  CREATININE 5.55*  --  5.54* 4.80* 3.94* 3.27* 2.60*  CALCIUM 9.0  --  9.1 9.1 9.8 9.5 9.3  PHOS  --   --   --  7.3* 6.6* 4.9* 3.6   CBC Recent Labs  Lab 02/15/23 1949 02/15/23 1956 02/15/23 2136 02/16/23 0322  02/17/23 0319  WBC 4.9  --   --  4.9 4.6  NEUTROABS 3.7  --   --   --  2.7  HGB 15.1* 19.4* 18.0* 15.7* 14.8  HCT 55.2* 57.0* 53.0* 56.9* 52.7*  MCV 83.9  --   --  84.3 82.6  PLT 280  --   --  273 271    Medications:     allopurinol  50 mg Oral QODAY   atorvastatin  10 mg Oral QHS   Chlorhexidine Gluconate Cloth  6 each Topical Q0600   enoxaparin (LOVENOX) injection  30 mg Subcutaneous Q24H   escitalopram  10 mg Oral q morning   insulin aspart  0-9 Units Subcutaneous TID WC   midodrine  10 mg Oral TID WC   mirabegron ER  25 mg Oral Daily   mupirocin ointment  1 Application Nasal BID   polyethylene glycol  17 g Oral Daily   torsemide  80 mg Oral BID    Bufford Buttner MD 02/20/2023, 11:16 AM

## 2023-02-20 NOTE — Progress Notes (Signed)
Pt currently on Gage tolerating well with SVS and in no distress. BiPAP (V60) on stby at bedside.

## 2023-02-20 NOTE — Progress Notes (Signed)
PROGRESS NOTE    SHAYANN MEDLOCK  WUJ:[id number] DOB: [birth year] DOA: 02/15/2023 PCP: [name]  67 y.o. female with a history of morbid obesity (BMI 55), OSA/OHS nonadherent to BiPAP, HTN, T2DM, stage IIIb CKD, functionally bed bound, multiple readmissions who presented to the ED on 02/15/2023 with confusion found to be severely hypoxemic, volume overloaded, placed on nonrebreather, blood glucose reportedly 18, pH 7.18, pCO2 85.3. Cr 5.5 with K of 6.2.  -Admitted, started on diuretics and BiPAP, nephrology following, kidney function improving  Subjective: Feels okay today, breathing is improving, used BiPAP last night  Assessment and Plan:  Acute hypoxic hypercapnic resp failure  Acute on chronic HFpEF:, Suspected RV failure -Echo 11/24 significantly limited study, poor acoustic windows, EF 55%, RV could not be visualized  -Volume status is improving, 7.2 L negative, creatinine improving -GDMT limited by CKD 4  - Continue midodrine, dose decreased - Appreciate nephrology assistance    AKI on stage IIIb CKD: Hyperphosphatemia -Likely cardiorenal, baseline creatinine around 1.6-2, on admission creatinine was 5.5 -Ultrasound without hydronephrosis -Improving with diuresis, continue Lasix, GI following -Phos levels improving   Hyperkalemia:  - Resolved.    T2DM: With hypoglycemia.  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 lift used, bedbound   DVT prophylaxis: Lovenox Code Status: Full code Family Communication: None present Disposition Plan: Home likely 48 hours  Consultants:    Procedures:   Antimicrobials:     Objective: Vitals:   02/19/23 2319 02/20/23 0303 02/20/23 0808 02/20/23 1152  BP: (!) 122/50 (!) 116/56 (!) 151/62 111/66  Pulse: 72 73 79 76  Resp: 17 14 20 19   Temp: 98 F (36.7 C) 97.7 F (36.5 C) 98.1 F (36.7 C) 98.5 F (36.9 C)  TempSrc: Axillary Oral Oral Oral  SpO2: 93% 90% 90% 91%  Weight:      Height:        Intake/Output Summary (Last 24 hours) at 02/20/2023 1254 Last data filed at 02/20/2023 1153 Gross per 24 hour  Intake 340 ml  Output 2550 ml  Net -2210 ml   Filed Weights   02/15/23 1924  Weight: (!) 137.5 kg    Examination:  General exam: Morbidly obese chronically ill female laying in bed, AAOx3 HEENT: Neck obese unable to assess JVD CVS: S1-S2, regular rhythm  Lungs: Distant breath sounds Abdomen: Soft, nontender, bowel sounds present Extremities: Trace edema, significantly obese  skin: No rashes Psychiatry:  Mood & affect appropriate.     Data Reviewed:   CBC: Recent Labs  Lab 02/15/23 1949 02/15/23 1956 02/15/23 2136 02/16/23 0322 02/17/23 0319  WBC 4.9  --   --  4.9 4.6  NEUTROABS 3.7  --   --   --  2.7  HGB 15.1* 19.4* 18.0* 15.7* 14.8  HCT 55.2* 57.0* 53.0* 56.9* 52.7*  MCV 83.9  --   --  84.3 82.6  PLT 280  --   --  273 271   Basic Metabolic Panel: Recent Labs  Lab 02/16/23 0322 02/17/23 0319 02/18/23 0308 02/19/23 0239 02/20/23 0303  NA 137 139 141 143 138  K 5.0 4.6 4.3 4.0 3.7  CL  94* 96* 99 99 96*  CO2 28 29 32 32 34*  GLUCOSE 64* 63* 59* 77 109*  BUN 98* 93* 88* 78* 71*  CREATININE 5.54* 4.80* 3.94* 3.27* 2.60*  CALCIUM 9.1 9.1 9.8 9.5 9.3  MG  --  3.8*  --   --   --   PHOS  --  7.3* 6.6* 4.9* 3.6   GFR: Estimated Creatinine Clearance: 27.4 mL/min (A) (by C-G formula based on SCr of 2.6 mg/dL (H)). Liver Function Tests: Recent Labs  Lab 02/15/23 2058 02/17/23 0319 02/18/23 0308 02/19/23 0239 02/20/23 0303  AST 32 17  --   --   --   ALT 42 36  --   --   --   ALKPHOS 210* 191*  --   --   --    BILITOT 1.5* 1.0  --   --   --   PROT 8.3* 8.2*  --   --   --   ALBUMIN 3.8 3.8 3.6 3.3* 3.3*   Recent Labs  Lab 02/16/23 0322  LIPASE 30   No results for input(s): "AMMONIA" in the last 168 hours. Coagulation Profile: No results for input(s): "INR", "PROTIME" in the last 168 hours. Cardiac Enzymes: No results for input(s): "CKTOTAL", "CKMB", "CKMBINDEX", "TROPONINI" in the last 168 hours. BNP (last 3 results) No results for input(s): "PROBNP" in the last 8760 hours. HbA1C: No results for input(s): "HGBA1C" in the last 72 hours. CBG: Recent Labs  Lab 02/19/23 1302 02/19/23 1605 02/19/23 2057 02/20/23 0553 02/20/23 1147  GLUCAP 128* 146* 157* 136* 145*   Lipid Profile: No results for input(s): "CHOL", "HDL", "LDLCALC", "TRIG", "CHOLHDL", "LDLDIRECT" in the last 72 hours. Thyroid Function Tests: No results for input(s): "TSH", "T4TOTAL", "FREET4", "T3FREE", "THYROIDAB" in the last 72 hours. Anemia Panel: No results for input(s): "VITAMINB12", "FOLATE", "FERRITIN", "TIBC", "IRON", "RETICCTPCT" in the last 72 hours. Urine analysis:    Component Value Date/Time   COLORURINE YELLOW 02/16/2023 0800   APPEARANCEUR CLEAR 02/16/2023 0800   LABSPEC 1.012 02/16/2023 0800   PHURINE 5.0 02/16/2023 0800   GLUCOSEU NEGATIVE 02/16/2023 0800   HGBUR NEGATIVE 02/16/2023 0800   BILIRUBINUR NEGATIVE 02/16/2023 0800   KETONESUR NEGATIVE 02/16/2023 0800   PROTEINUR 30 (A) 02/16/2023 0800   UROBILINOGEN 0.2 06/28/2014 1500   NITRITE NEGATIVE 02/16/2023 0800   LEUKOCYTESUR NEGATIVE 02/16/2023 0800   Sepsis Labs: @LABRCNTIP (procalcitonin:4,lacticidven:4)  ) Recent Results (from the past 240 hours)  Resp panel by RT-PCR (RSV, Flu A&B, Covid) Anterior Nasal Swab     Status: None   Collection Time: 02/15/23  8:15 PM   Specimen: Anterior Nasal Swab  Result Value Ref Range Status   SARS Coronavirus 2 by RT PCR NEGATIVE NEGATIVE Final   Influenza A by PCR NEGATIVE NEGATIVE Final    Influenza B by PCR NEGATIVE NEGATIVE Final    Comment: (NOTE) The Xpert Xpress SARS-CoV-2/FLU/RSV plus assay is intended as an aid in the diagnosis of influenza from Nasopharyngeal swab specimens and should not be used as a sole basis for treatment. Nasal washings and aspirates are unacceptable for Xpert Xpress SARS-CoV-2/FLU/RSV testing.  Fact Sheet for Patients: BloggerCourse.com  Fact Sheet for Healthcare Providers: SeriousBroker.it  This test is not yet approved or cleared by the [place] FDA and has been authorized for detection and/or diagnosis of SARS-CoV-2 by FDA under an Emergency Use Authorization (EUA). This EUA will remain in effect (meaning this test can be used) for the duration of the COVID-19 declaration under Section  564(b)(1) of the Act, 21 U.S.C. section 360bbb-3(b)(1), unless the authorization is terminated or revoked.     Resp Syncytial Virus by PCR NEGATIVE NEGATIVE Final    Comment: (NOTE) Fact Sheet for Patients: BloggerCourse.com  Fact Sheet for Healthcare Providers: SeriousBroker.it  This test is not yet approved or cleared by the [place] FDA and has been authorized for detection and/or diagnosis of SARS-CoV-2 by FDA under an Emergency Use Authorization (EUA). This EUA will remain in effect (meaning this test can be used) for the duration of the COVID-19 declaration under Section 564(b)(1) of the Act, 21 U.S.C. section 360bbb-3(b)(1), unless the authorization is terminated or revoked.  Performed at [facility], [address], [place], [place] [address]   MRSA Next Gen by PCR, Nasal     Status: Abnormal   Collection Time: 02/16/23  9:58 AM   Specimen: Nasal Mucosa; Nasal Swab  Result Value Ref Range Status   MRSA by PCR Next Gen DETECTED (A) NOT DETECTED Final    Comment: RESULT CALLED TO, READ BACK BY AND VERIFIED WITH: RN  VERONICA B 1440 [id number] FCP (NOTE) The GeneXpert MRSA Assay (FDA approved for NASAL specimens only), is one component of a comprehensive MRSA colonization surveillance program. It is not intended to diagnose MRSA infection nor to guide or monitor treatment for MRSA infections. Test performance is not FDA approved in patients less than 77 years old. Performed at [facility], [address] [place], [place] [address]      Radiology Studies: No results found.   Scheduled Meds:  allopurinol  50 mg Oral QODAY   atorvastatin  10 mg Oral QHS   Chlorhexidine Gluconate Cloth  6 each Topical Q0600   enoxaparin (LOVENOX) injection  30 mg Subcutaneous Q24H   escitalopram  10 mg Oral q morning   insulin aspart  0-9 Units Subcutaneous TID WC   midodrine  5 mg Oral TID WC   mirabegron ER  25 mg Oral Daily   mupirocin ointment  1 Application Nasal BID   polyethylene glycol  17 g Oral Daily   torsemide  80 mg Oral BID   Continuous Infusions:   LOS: 4 days    Time spent:    Zannie Cove, MD [facility]   02/20/2023, 12:54 PM

## 2023-02-20 NOTE — Progress Notes (Signed)
This chaplain responded to PMT NP-Michelle consult for facilitating an Advance Directive notary visit. The chaplain asked the Pt. clarifying questions from the NP previous AD education. The Pt. son-[name] is on speaker phone during the visit.  The chaplain is present with the Pt. notary, and witnesses for the notarizing of the Pt. AD: HCPOA and Living Will.   The Pt. chose [name] as his healthcare agent.  This chaplain gave the Pt. the original AD along with one copy. The chaplain scanned the Pt. AD into the Pt. EMR.  This chaplain is available for F/U spiritual care as needed.  Chaplain Stephanie Acre [phone]

## 2023-02-20 NOTE — Progress Notes (Signed)
Palliative Medicine Inpatient Follow Up Note  HPI: [name] [name] is a 67 y.o. female with a history of morbid obesity (BMI 55), OSA/OHS nonadherent to BiPAP, HTN, T2DM, stage IIIb CKD, functionally bed bound, multiple readmissions who presented to the ED on 02/15/2023 with confusion found to be severely hypoxemic in the setting of heart failure. Has been on diuresis and improving gradually. The Palliative medicine team has been asked to get involved to further discuss goals of care.   Today's Discussion 02/20/2023  *Please note that this is a verbal dictation therefore any spelling or grammatical errors are due to the "Dragon Medical One" system interpretation.  Chart reviewed inclusive of vital signs, progress notes, laboratory results, and diagnostic images.   I met with [name] [name] at bedside this morning. She is quite alert and oriented this morning. She had remembered meeting me yesterday and the context of our conversation. She shares that she is feeling improved.   I was able to again discuss with [name] [name] her chronic heart failure and the progressive nature of the disease despite current treatments. She does express understanding. Created space and opportunity for patient to explore thoughts feelings and fears regarding current medical situation. She shares the hope to get back home in the oncoming days.  Discussed the importance of advanced care planning. I was able to reviewed the AD's with [name] [name] formally, she filled in the section(s) according to her wishes. I shared the chaplain would help with the formal completion of the documents.  [name] [name] shares with me the importance of her sisters and grandchildren in her life. She reviews that they are strong advocates for her care. 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   Plan at this time is to continue diuresis.   Questions  and concerns addressed/Palliative Support Provided.   Objective Assessment: Vital Signs Vitals:   02/20/23 0808 02/20/23 1152  BP: (!) 151/62 111/66  Pulse: 79 76  Resp: 20 19  Temp: 98.1 F (36.7 C) 98.5 F (36.9 C)  SpO2: 90% 91%    Intake/Output Summary (Last 24 hours) at 02/20/2023 1319 Last data filed at 02/20/2023 1153 Gross per 24 hour  Intake 340 ml  Output 2550 ml  Net -2210 ml   Last Weight  Most recent update: 02/15/2023  7:24 PM    Weight  137.5 kg (303 lb 2.1 oz)              Gen: Elderly obese African-American female chronically ill-appearing HEENT: moist mucous membranes CV: Regular rate and rhythm  PULM: On 4 L nasal cannula breathing is even and unlabored ABD: soft/nontender  EXT: (+) edema  Neuro: Alert and oriented x2 -patient not clear on situation and intermittently dosing off  SUMMARY OF RECOMMENDATIONS   Full Code/Full Scope of Care   Chaplain to help with advanced care planning documents   Discussed patients chronic heart failure reviewed the progressive nature of the disease   Discussed patient's OSA and the importance of using BiPAP as prescribed   Plan for OP Palliative support on discharge   The Palliative Medicine Team will continue to follow along  Billing based on MDM: High ______________________________________________________________________________________ [name] [place] Palliative Medicine Team Team Cell Phone: [phone] Please utilize secure chat with additional questions, if there is no response within 30 minutes please call the above phone number  Palliative Medicine Team  providers are available by phone from 7am to 7pm daily and can be reached through the team cell phone.  Should this patient require assistance outside of these hours, please call the patient's attending physician.

## 2023-02-21 DIAGNOSIS — Z515 Encounter for palliative care: Secondary | ICD-10-CM | POA: Diagnosis not present

## 2023-02-21 LAB — GLUCOSE, CAPILLARY
Glucose-Capillary: 117 mg/dL — ABNORMAL HIGH (ref 70–99)
Glucose-Capillary: 109 mg/dL — ABNORMAL HIGH (ref 70–99)
Glucose-Capillary: 133 mg/dL — ABNORMAL HIGH (ref 70–99)
Glucose-Capillary: 137 mg/dL — ABNORMAL HIGH (ref 70–99)

## 2023-02-21 LAB — RENAL FUNCTION PANEL
Albumin: 3 g/dL — ABNORMAL LOW (ref 3.5–5.0)
Anion gap: 9 (ref 5–15)
BUN: 59 mg/dL — ABNORMAL HIGH (ref 8–23)
CO2: 37 mmol/L — ABNORMAL HIGH (ref 22–32)
Calcium: 9.5 mg/dL (ref 8.9–10.3)
Chloride: 98 mmol/L (ref 98–111)
Creatinine, Ser: 2.24 mg/dL — ABNORMAL HIGH (ref 0.44–1.00)
GFR, Estimated: 23 mL/min — ABNORMAL LOW (ref >60)
Glucose, Bld: 105 mg/dL — ABNORMAL HIGH (ref 70–99)
Phosphorus: 3.4 mg/dL (ref 2.5–4.6)
Potassium: 3.6 mmol/L (ref 3.5–5.1)
Sodium: 144 mmol/L (ref 135–145)

## 2023-02-21 MED ORDER — CAMPHOR-MENTHOL 0.5-0.5 % EX LOTN
TOPICAL_LOTION | CUTANEOUS | Status: DC | PRN
  Administered 2023-02-21: 1 via TOPICAL
  Filled 2023-02-21 (×2): qty 222

## 2023-02-21 NOTE — Progress Notes (Signed)
   02/21/23 1210  TOC Brief Assessment  Insurance and Status Reviewed  Patient has primary care physician Yes  Home environment has been reviewed home w/ aides  Prior level of function: bedbound uses [employer] Current home services (active w/ [name] for HHOT)  Social Drivers of Health Review SDOH reviewed no interventions necessary  Readmission risk has been reviewed Yes  Transition of care needs transition of care needs identified, TOC will continue to follow     Pt will need resumption orders for [facility]- active with [name] (spke w/ [facility]). Will need EMS transport home on discharge. Noted pt agreeable to outpt PC- referral made to [facility] for PC/serious illness program- spoke with Shawn.  TOC will continue to follow.

## 2023-02-21 NOTE — Progress Notes (Signed)
 [facility] Progress Note   Assessment/ Plan:   Pt is a 67 y.o. yo female with obesity, DM, HTN, osa, bedbound who was admitted on 02/15/2023 with increasing SOB, hypoxia and A on CRF  Assessment/Plan: 1. Renal-  A on CRF-  able to find out baseline crt 1.6-2-  now with A on CRF 5.5 on presentation- urine bland- no obvious obstruction on us  although left kidney not visualized well.  Thinking is hemodynamic issue with heart failure.  Renal function has improved  - switch IV Lasix  to PO torsemide  - tolerating well- can decrease if needed, home dose torsemide  only 20 mg daily - nothing else to add- will sign off- call with questions.  2. HTN/volume: decrease midodrine  to 5 TID, hopefully can stop eventually 3. Anemia- not an issue right now  4. pulm-  hypoxia and OSA-  on bipap-  mentation  improved form admit but still confused ( dont know baseline)  5. Hyperkalemia-  resolved   Subjective:    Seen in room.  Tolerating conversion from Lasix  to PO torsemide  well.     Objective:   BP 135/62 (BP Location: Left Arm)   Pulse 69   Temp 98.1 F (36.7 C) (Oral)   Resp 14   Ht 5' 2 (1.575 m)   Wt 125 kg   SpO2 94%   BMI 50.40 kg/m   Intake/Output Summary (Last 24 hours) at 02/21/2023 1159 Last data filed at 02/21/2023 1129 Gross per 24 hour  Intake 610 ml  Output 3100 ml  Net -2490 ml   Weight change:   Physical Exam: Gen:NAD, lying in bed CVS: RRR Resp: clear Abd: soft Ext: no LE edema, skin texture present  Imaging: No results found.  Labs: BMET Recent Labs  Lab 02/15/23 2058 02/15/23 2136 02/16/23 0322 02/17/23 0319 02/18/23 0308 02/19/23 0239 02/20/23 0303 02/21/23 0402  NA 137 136 137 139 141 143 138 144  K 6.2* 5.3* 5.0 4.6 4.3 4.0 3.7 3.6  CL 95*  --  94* 96* 99 99 96* 98  CO2 23  --  28 29 32 32 34* 37*  GLUCOSE 73  --  64* 63* 59* 77 109* 105*  BUN 100*  --  98* 93* 88* 78* 71* 59*  CREATININE 5.55*  --  5.54* 4.80* 3.94* 3.27* 2.60*  2.24*  CALCIUM  9.0  --  9.1 9.1 9.8 9.5 9.3 9.5  PHOS  --   --   --  7.3* 6.6* 4.9* 3.6 3.4   CBC Recent Labs  Lab 02/15/23 1949 02/15/23 1956 02/15/23 2136 02/16/23 0322 02/17/23 0319  WBC 4.9  --   --  4.9 4.6  NEUTROABS 3.7  --   --   --  2.7  HGB 15.1* 19.4* 18.0* 15.7* 14.8  HCT 55.2* 57.0* 53.0* 56.9* 52.7*  MCV 83.9  --   --  84.3 82.6  PLT 280  --   --  273 271    Medications:     allopurinol   50 mg Oral QODAY   atorvastatin   10 mg Oral QHS   Chlorhexidine  Gluconate Cloth  6 each Topical Q0600   enoxaparin  (LOVENOX ) injection  30 mg Subcutaneous Q24H   escitalopram   10 mg Oral q morning   insulin  aspart  0-9 Units Subcutaneous TID WC   midodrine   5 mg Oral TID WC   mirabegron  ER  25 mg Oral Daily   polyethylene glycol  17 g Oral Daily   torsemide   80  mg Oral BID    Almarie Bonine MD 02/21/2023, 11:59 AM

## 2023-02-21 NOTE — Progress Notes (Signed)
   Palliative Medicine Inpatient Follow Up Note  HPI: [name] [name] is a 67 y.o. female with a history of morbid obesity (BMI 55), OSA/OHS nonadherent to BiPAP, HTN, T2DM, stage IIIb CKD, functionally bed bound, multiple readmissions who presented to the ED on 02/15/2023 with confusion found to be severely hypoxemic in the setting of heart failure. Has been on diuresis and improving gradually. The Palliative medicine team has been asked to get involved to further discuss goals of care.   Today's Discussion 02/21/2023  *Please note that this is a verbal dictation therefore any spelling or grammatical errors are due to the Dragon Medical One system interpretation.  Chart reviewed inclusive of vital signs, progress notes, laboratory results, and diagnostic images. Cr continues to improve, appears to be diuresing well.   I met with [name] [name] this morning. She is awake and alert. She shares with me that she is feeling very much improved today. She denies pain, shortness of breath or nausea.   [name] [name] shares that she did complete her advanced directives yesterday.   Confirmed ongoing interest in OP Palliative care services on discharge.  Questions and concerns addressed/Palliative Support Provided.   Objective Assessment: Vital Signs Vitals:   02/21/23 0813 02/21/23 0827  BP: 135/62   Pulse: (!) 58 69  Resp: 13 14  Temp: 97.7 F (36.5 C)   SpO2: 93% 94%    Intake/Output Summary (Last 24 hours) at 02/21/2023 1058 Last data filed at 02/21/2023 0600 Gross per 24 hour  Intake 610 ml  Output 3300 ml  Net -2690 ml   Last Weight  Most recent update: 02/21/2023  6:05 AM    Weight  125 kg (275 lb 9.2 oz)            Gen: Elderly obese African-American female chronically ill-appearing HEENT: moist mucous membranes CV: Regular rate and rhythm  PULM: On 4 L nasal cannula breathing is even and unlabored ABD: soft/nontender  EXT: (+) edema  Neuro: Alert and oriented x2  -patient not clear on situation and intermittently dosing off  SUMMARY OF RECOMMENDATIONS   Full Code/Full Scope of Care   Advanced Directives have been completed   Plan for OP Palliative support on discharge   The Palliative Medicine Team will continue to follow along incrementally  Billing based on MDM: Low ______________________________________________________________________________________ [name] [name] [place] Palliative Medicine Team Team Cell Phone: [phone] Please utilize secure chat with additional questions, if there is no response within 30 minutes please call the above phone number  Palliative Medicine Team providers are available by phone from 7am to 7pm daily and can be reached through the team cell phone.  Should this patient require assistance outside of these hours, please call the patient's attending physician.

## 2023-02-21 NOTE — Plan of Care (Signed)
 Reviewing plan of care. Pt has been progressing.    Problem: Coping: Goal: Ability to adjust to condition or change in health will improve Outcome: Progressing: She is pleasant, alert and oriented x 4.   Problem: Fluid Volume: volume overload. Goal: Ability to maintain a balanced intake and output will improve Outcome: Progressing: with Demadex  80 mg BID for diuresis.   Problem: Health Behavior/Discharge Planning: Goal: Ability to identify and utilize available resources and services will improve Outcome: Progressing Goal: Ability to manage health-related needs will improve Outcome: Progressing   Problem: Metabolic: Goal: Ability to maintain appropriate glucose levels will improve Outcome: Progressing. ACSH with insulin  sliding scale and long acting insulin , no hypoglycemia.   Problem: Nutritional: Goal: Maintenance of adequate nutrition will improve Outcome: Progressing: Pt reported gradually increasing her appetite.  Goal: Progress toward achieving an optimal weight will improve Outcome: Progressing   Problem: Skin Integrity: Goal: Risk for impaired skin integrity will decrease Outcome: Progressing, no skin breakdown or pressure injuries.    Problem: Health Behavior/Discharge Planning: Goal: Ability to manage health-related needs will improve Outcome: Progressing   Problem: Clinical Measurements: Goal: Ability to maintain clinical measurements within normal limits will improve Outcome: Progressing Goal: Will remain free from infection Outcome: Progressing: afebrile Goal: Diagnostic test results will improve Outcome: Progressing Goal: Respiratory complications will improve Outcome: Progressing: on BiPAP at night, on 4 LPM on day time, SPO2 91-98%. Normal respiratory rate and efforts, no acute distress. Goal: Cardiovascular complication will be avoided Outcome: Progressing: NSR on the monitor,stable hemodynamically.   Problem: Activity: Baseline non ambulatory, bed  bound. Goal: Risk for activity intolerance will decrease Outcome: Progressing, requires maximum +2 assisted.   Problem: Elimination: constipation, last BM was previous this admission ( 02/15/23) Goal: Will not experience complications related to bowel motility Outcome: Not progressing: Pt refused laxative. She is under fluid restriction < 1200 ml due to volume overload. High fiber intake was encouraged. The necessity of bowel regimens discussed.   Goal: Will not experience complications related to urinary retention Outcome: Progressing: using PeurWick. We will continue to monitor I&O.   Wendi Dash, RN

## 2023-02-21 NOTE — Progress Notes (Signed)
RT at bedside to find pt removed from BiPAP and placed on [place]. Pt tolerating well with SVS and in no distress at this time. BiPAP is on stby at bedside.

## 2023-02-21 NOTE — Progress Notes (Signed)
 PROGRESS NOTE    [name] [name]  FMW:[id number] DOB: 01-Jan-1956 DOA: 02/15/2023 PCP: [facility]  67 y.o. female with a history of morbid obesity (BMI 55), OSA/OHS nonadherent to BiPAP, HTN, T2DM, stage IIIb CKD, functionally bed bound, multiple readmissions who presented to the ED on 02/15/2023 with confusion found to be severely hypoxemic, volume overloaded, placed on nonrebreather, blood glucose reportedly 18, pH 7.18, pCO2 85.3. Cr 5.5 with K of 6.2.  -Admitted, started on diuretics and BiPAP, nephrology following, kidney function improving  Subjective: Feels okay, no events overnight, breathing is improving, on oral torsemide  now  Assessment and Plan:  Acute hypoxic hypercapnic resp failure  Acute on chronic HFpEF:, Suspected RV failure -Echo 11/24 significantly limited study, poor acoustic windows, EF 55%, RV could not be visualized  -Volume status is improving, 9.8 L negative, creatinine improving  -GDMT limited by CKD 4  - Continue midodrine , dose decreased - Appreciate nephrology assistance, changed to torsemide  80 Mg twice daily, will decrease dose over the next 1 to 2 days   AKI on stage IIIb CKD: Hyperphosphatemia -Likely cardiorenal, baseline creatinine around 1.6-2, on admission creatinine was 5.5 -Ultrasound without hydronephrosis -Improving with diuresis, now on torsemide , nephrology -Phos levels improving   Hyperkalemia:  - Resolved.    T2DM: With hypoglycemia.  - DC basal insulin  - Continue SSI though needing to use hypoglycemia protocols despite HbA1c recently 9.2%    Gout  - Continue allopurinol    HLD  - Continue atorvastatin      Depression  - Continue SSRI   Morbid obesity OSA OHS Continue nightly BiPAP here, on CPAP nightly at home with poor compliance  Debility, bedbound, nonambulatory -Has CNA's at home, [facility] lift used, bedbound   DVT prophylaxis: Lovenox  Code Status: Full code Family Communication: None  present Disposition Plan: Home likely tomorrow  Consultants:    Procedures:   Antimicrobials:    Objective: Vitals:   02/21/23 0600 02/21/23 0813 02/21/23 0827 02/21/23 1128  BP:  135/62  (!) 141/61  Pulse:  (!) 58 69 69  Resp:  13 14 18   Temp:  97.7 F (36.5 C)  98.1 F (36.7 C)  TempSrc:  Oral  Oral  SpO2:  93% 94% 92%  Weight: 125 kg     Height:        Intake/Output Summary (Last 24 hours) at 02/21/2023 1229 Last data filed at 02/21/2023 1129 Gross per 24 hour  Intake 850 ml  Output 3100 ml  Net -2250 ml   Filed Weights   02/15/23 1924 02/21/23 0600  Weight: (!) 137.5 kg 125 kg    Examination:  General exam: Morbidly obese chronically ill female laying in bed, AAOx3 HEENT: Neck obese unable to assess JVD CVS: S1-S2, regular rhythm  Lungs: Distant breath sounds Abdomen: Soft, nontender, bowel sounds present Extremities: Trace edema, significantly obese  skin: No rashes Psychiatry:  Mood & affect appropriate.     Data Reviewed:   CBC: Recent Labs  Lab 02/15/23 1949 02/15/23 1956 02/15/23 2136 02/16/23 0322 02/17/23 0319  WBC 4.9  --   --  4.9 4.6  NEUTROABS 3.7  --   --   --  2.7  HGB 15.1* 19.4* 18.0* 15.7* 14.8  HCT 55.2* 57.0* 53.0* 56.9* 52.7*  MCV 83.9  --   --  84.3 82.6  PLT 280  --   --  273 271   Basic Metabolic Panel: Recent Labs  Lab 02/17/23 0319 02/18/23 0308 02/19/23 0239 02/20/23 0303 02/21/23 0402  NA 139 141 143 138 144  K 4.6 4.3 4.0 3.7 3.6  CL 96* 99 99 96* 98  CO2 29 32 32 34* 37*  GLUCOSE 63* 59* 77 109* 105*  BUN 93* 88* 78* 71* 59*  CREATININE 4.80* 3.94* 3.27* 2.60* 2.24*  CALCIUM  9.1 9.8 9.5 9.3 9.5  MG 3.8*  --   --   --   --   PHOS 7.3* 6.6* 4.9* 3.6 3.4   GFR: Estimated Creatinine Clearance: 30 mL/min (A) (by C-G formula based on SCr of 2.24 mg/dL (H)). Liver Function Tests: Recent Labs  Lab 02/15/23 2058 02/17/23 0319 02/18/23 0308 02/19/23 0239 02/20/23 0303 02/21/23 0402  AST 32 17  --    --   --   --   ALT 42 36  --   --   --   --   ALKPHOS 210* 191*  --   --   --   --   BILITOT 1.5* 1.0  --   --   --   --   PROT 8.3* 8.2*  --   --   --   --   ALBUMIN  3.8 3.8 3.6 3.3* 3.3* 3.0*   Recent Labs  Lab 02/16/23 0322  LIPASE 30   No results for input(s): AMMONIA in the last 168 hours. Coagulation Profile: No results for input(s): INR, PROTIME in the last 168 hours. Cardiac Enzymes: No results for input(s): CKTOTAL, CKMB, CKMBINDEX, TROPONINI in the last 168 hours. BNP (last 3 results) No results for input(s): PROBNP in the last 8760 hours. HbA1C: No results for input(s): HGBA1C in the last 72 hours. CBG: Recent Labs  Lab 02/20/23 1147 02/20/23 1711 02/20/23 2124 02/21/23 0602 02/21/23 1131  GLUCAP 145* 117* 118* 117* 109*   Lipid Profile: No results for input(s): CHOL, HDL, LDLCALC, TRIG, CHOLHDL, LDLDIRECT in the last 72 hours. Thyroid Function Tests: No results for input(s): TSH, T4TOTAL, FREET4, T3FREE, THYROIDAB in the last 72 hours. Anemia Panel: No results for input(s): VITAMINB12, FOLATE, FERRITIN, TIBC, IRON, RETICCTPCT in the last 72 hours. Urine analysis:    Component Value Date/Time   COLORURINE YELLOW 02/16/2023 0800   APPEARANCEUR CLEAR 02/16/2023 0800   LABSPEC 1.012 02/16/2023 0800   PHURINE 5.0 02/16/2023 0800   GLUCOSEU NEGATIVE 02/16/2023 0800   HGBUR NEGATIVE 02/16/2023 0800   BILIRUBINUR NEGATIVE 02/16/2023 0800   KETONESUR NEGATIVE 02/16/2023 0800   PROTEINUR 30 (A) 02/16/2023 0800   UROBILINOGEN 0.2 06/28/2014 1500   NITRITE NEGATIVE 02/16/2023 0800   LEUKOCYTESUR NEGATIVE 02/16/2023 0800   Sepsis Labs: @LABRCNTIP (procalcitonin:4,lacticidven:4)  ) Recent Results (from the past 240 hours)  Resp panel by RT-PCR (RSV, Flu A&B, Covid) Anterior Nasal Swab     Status: None   Collection Time: 02/15/23  8:15 PM   Specimen: Anterior Nasal Swab  Result Value Ref Range Status   SARS  Coronavirus 2 by RT PCR NEGATIVE NEGATIVE Final   Influenza A by PCR NEGATIVE NEGATIVE Final   Influenza B by PCR NEGATIVE NEGATIVE Final    Comment: (NOTE) The Xpert Xpress SARS-CoV-2/FLU/RSV plus assay is intended as an aid in the diagnosis of influenza from Nasopharyngeal swab specimens and should not be used as a sole basis for treatment. Nasal washings and aspirates are unacceptable for Xpert Xpress SARS-CoV-2/FLU/RSV testing.  Fact Sheet for Patients: bloggercourse.com  Fact Sheet for Healthcare Providers: seriousbroker.it  This test is not yet approved or cleared by the [place]  FDA and has been authorized for detection and/or diagnosis  of SARS-CoV-2 by FDA under an Emergency Use Authorization (EUA). This EUA will remain in effect (meaning this test can be used) for the duration of the COVID-19 declaration under Section 564(b)(1) of the Act, 21 U.S.C. section 360bbb-3(b)(1), unless the authorization is terminated or revoked.     Resp Syncytial Virus by PCR NEGATIVE NEGATIVE Final    Comment: (NOTE) Fact Sheet for Patients: bloggercourse.com  Fact Sheet for Healthcare Providers: seriousbroker.it  This test is not yet approved or cleared by the [place]  FDA and has been authorized for detection and/or diagnosis of SARS-CoV-2 by FDA under an Emergency Use Authorization (EUA). This EUA will remain in effect (meaning this test can be used) for the duration of the COVID-19 declaration under Section 564(b)(1) of the Act, 21 U.S.C. section 360bbb-3(b)(1), unless the authorization is terminated or revoked.  Performed at [facility], [address] [place], [place] [address]   MRSA Next Gen by PCR, Nasal     Status: Abnormal   Collection Time: 02/16/23  9:58 AM   Specimen: Nasal Mucosa; Nasal Swab  Result Value Ref Range Status   MRSA by PCR Next Gen  DETECTED (A) NOT DETECTED Final    Comment: RESULT CALLED TO, READ BACK BY AND VERIFIED WITH: RN VERONICA B 1440 [id number] FCP (NOTE) The GeneXpert MRSA Assay (FDA approved for NASAL specimens only), is one component of a comprehensive MRSA colonization surveillance program. It is not intended to diagnose MRSA infection nor to guide or monitor treatment for MRSA infections. Test performance is not FDA approved in patients less than 12 years old. Performed at [facility], [address] [place], [place] [address]      Radiology Studies: No results found.   Scheduled Meds:  allopurinol   50 mg Oral QODAY   atorvastatin   10 mg Oral QHS   Chlorhexidine  Gluconate Cloth  6 each Topical Q0600   enoxaparin  (LOVENOX ) injection  30 mg Subcutaneous Q24H   escitalopram   10 mg Oral q morning   insulin  aspart  0-9 Units Subcutaneous TID WC   midodrine   5 mg Oral TID WC   mirabegron  ER  25 mg Oral Daily   polyethylene glycol  17 g Oral Daily   torsemide   80 mg Oral BID   Continuous Infusions:   LOS: 5 days    Time spent:    Sigurd Pac, MD [facility]   02/21/2023, 12:29 PM

## 2023-02-21 NOTE — Evaluation (Signed)
 Physical Therapy Evaluation Patient Details Name: [name] [name] MRN: [id number] DOB: December 05, 1955 Today's Date: 02/21/2023  History of Present Illness  The pt is a 67 yo female presenting 11/22 with SOB. PMH includes: HFrEF, chronic hypoxia on 4L (nonadherent), CKD III, DM II, HLD, OSA/OHS, class III obesity (BMI >40).  Clinical Impression  Pt is presenting close to baseline level of functioning. Pt is Max A for rolling. Pt is non-ambulatory and is working on bed mobility with HHPT. Pt would benefit from SNF level of care but would like to return home with HHPT. Due to pt current functional status, home set up and pt wishes recommending skilled physical therapy services 3x/week in order to address strength, balance and functional mobility to decrease risk for falls, injury and re-hospitalization.           If plan is discharge home, recommend the following: Two people to help with walking and/or transfers;Assistance with cooking/housework;Assist for transportation;Help with stairs or ramp for entrance     Equipment Recommendations None recommended by PT     Functional Status Assessment Patient has not had a recent decline in their functional status     Precautions / Restrictions Precautions Precautions: Fall Restrictions Weight Bearing Restrictions Per Provider Order: No      Mobility  Bed Mobility Overal bed mobility: Needs Assistance Bed Mobility: Rolling Rolling: Max assist, Used rails         General bed mobility comments: max A rolling R/L in the bed with use of bed rails in order to assist with peri care    Transfers     General transfer comment: deferred    Ambulation/Gait     General Gait Details: pt is non-ambulatory         Pertinent Vitals/Pain Pain Assessment Pain Assessment: No/denies pain    Home Living Family/patient expects to be discharged to:: Private residence Living Arrangements: Alone Available Help at Discharge: Personal care  attendant;Friend(s) (9-12 am and 6-8 pm personal care attendent) Type of Home: House Home Access: Level entry       Home Layout: One level Home Equipment: Wheelchair - power;Hospital bed;Other (comment) (hoyer lift) Additional Comments: She has home health aides 7 days per week for 3 hrs in the a.m. and 2 hrs in the p.m.    Prior Function Prior Level of Function : Needs assist       Physical Assist : Mobility (physical);ADLs (physical) Mobility (physical): Transfers ADLs (physical): Bathing;Dressing;Toileting;IADLs Mobility Comments: pt not ambulated in years but is hopeful to start standing again. states she is able to sit EOB with assist and complete rolling in bed independently. using lift to get OOB but can propel power WC. Working with HHPT ADLs Comments: pt reports use of brief in chair, aides do all bathing and dressing in bed     Extremity/Trunk Assessment   Upper Extremity Assessment Upper Extremity Assessment: Generalized weakness    Lower Extremity Assessment Lower Extremity Assessment: Generalized weakness    Cervical / Trunk Assessment Cervical / Trunk Assessment: Normal  Communication   Communication Communication: No apparent difficulties  Cognition Arousal: Alert Behavior During Therapy: WFL for tasks assessed/performed Overall Cognitive Status: Within Functional Limits for tasks assessed        General Comments General comments (skin integrity, edema, etc.): No noted skin issues on pt buttocks during peri care. Pt reports no sorenss.        Assessment/Plan    PT Assessment Patient needs continued PT services  PT Problem List  Decreased strength;Decreased activity tolerance;Decreased mobility       PT Treatment Interventions DME instruction;Therapeutic activities;Therapeutic exercise;Balance training;Functional mobility training;Patient/family education;Wheelchair mobility training    PT Goals (Current goals can be found in the Care Plan  section)  Acute Rehab PT Goals Patient Stated Goal: to return home PT Goal Formulation: With patient Time For Goal Achievement: 03/07/23 Potential to Achieve Goals: Fair    Frequency Min 1X/week        AM-PAC PT 6 Clicks Mobility  Outcome Measure Help needed turning from your back to your side while in a flat bed without using bedrails?: A Lot Help needed moving from lying on your back to sitting on the side of a flat bed without using bedrails?: Total Help needed moving to and from a bed to a chair (including a wheelchair)?: Total Help needed standing up from a chair using your arms (e.g., wheelchair or bedside chair)?: Total Help needed to walk in hospital room?: Total Help needed climbing 3-5 steps with a railing? : Total 6 Click Score: 7    End of Session   Activity Tolerance: Patient tolerated treatment well Patient left: in bed;with call bell/phone within reach Nurse Communication: Mobility status PT Visit Diagnosis: Other abnormalities of gait and mobility (R26.89)    Time: 1135-1150 PT Time Calculation (min) (ACUTE ONLY): 15 min   Charges:   PT Evaluation $PT Eval Low Complexity: 1 Low   PT General Charges $$ ACUTE PT VISIT: 1 Visit         [name] [name], DPT, CLT  Acute Rehabilitation Services Office: [phone] (Secure chat preferred)   [name] [name] 02/21/2023, 1:29 PM

## 2023-02-21 NOTE — Progress Notes (Signed)
 [name] [name] 4E25 [facility] Liaison Note  Notified by [facility] Rayfield Gobble, RN of request for outpatient palliative services at home after discharge.  We will follow for discharge disposition.  Please call with any questions.  Thank you for the opportunity to participate in this patients care.  Thank you, Randine Nail, BSN, [facility] [phone]

## 2023-02-22 DIAGNOSIS — Z515 Encounter for palliative care: Secondary | ICD-10-CM | POA: Diagnosis not present

## 2023-02-22 DIAGNOSIS — Z7189 Other specified counseling: Secondary | ICD-10-CM | POA: Diagnosis not present

## 2023-02-22 LAB — RENAL FUNCTION PANEL
Albumin: 2.9 g/dL — ABNORMAL LOW (ref 3.5–5.0)
Anion gap: 11 (ref 5–15)
BUN: 51 mg/dL — ABNORMAL HIGH (ref 8–23)
CO2: 36 mmol/L — ABNORMAL HIGH (ref 22–32)
Calcium: 9.5 mg/dL (ref 8.9–10.3)
Chloride: 95 mmol/L — ABNORMAL LOW (ref 98–111)
Creatinine, Ser: 2.13 mg/dL — ABNORMAL HIGH (ref 0.44–1.00)
GFR, Estimated: 25 mL/min — ABNORMAL LOW (ref >60)
Glucose, Bld: 119 mg/dL — ABNORMAL HIGH (ref 70–99)
Phosphorus: 3.3 mg/dL (ref 2.5–4.6)
Potassium: 3.6 mmol/L (ref 3.5–5.1)
Sodium: 142 mmol/L (ref 135–145)

## 2023-02-22 LAB — GLUCOSE, CAPILLARY
Glucose-Capillary: 119 mg/dL — ABNORMAL HIGH (ref 70–99)
Glucose-Capillary: 150 mg/dL — ABNORMAL HIGH (ref 70–99)
Glucose-Capillary: 154 mg/dL — ABNORMAL HIGH (ref 70–99)
Glucose-Capillary: 144 mg/dL — ABNORMAL HIGH (ref 70–99)

## 2023-02-22 MED ORDER — POTASSIUM CHLORIDE CRYS ER 20 MEQ PO TBCR
40.0000 meq | EXTENDED_RELEASE_TABLET | Freq: Once | ORAL | Status: AC
Start: 2023-02-22 — End: 2023-02-22
  Administered 2023-02-22: 40 meq via ORAL
  Filled 2023-02-22: qty 2

## 2023-02-22 MED ORDER — PREGABALIN 25 MG PO CAPS
50.0000 mg | ORAL_CAPSULE | Freq: Two times a day (BID) | ORAL | Status: DC
  Administered 2023-02-22 – 2023-02-23 (×3): 50 mg via ORAL
  Filled 2023-02-22 (×3): qty 2

## 2023-02-22 NOTE — Progress Notes (Addendum)
   Palliative Medicine Inpatient Follow Up Note HPI: [name] [name] is a 68 y.o. female with a history of morbid obesity (BMI 55), OSA/OHS nonadherent to BiPAP, HTN, T2DM, stage IIIb CKD, functionally bed bound, multiple readmissions who presented to the ED on 02/15/2023 with confusion found to be severely hypoxemic in the setting of heart failure. Has been on diuresis and improving gradually. The Palliative medicine team has been asked to get involved to further discuss goals of care.   Today's Discussion 02/22/2023  *Please note that this is a verbal dictation therefore any spelling or grammatical errors are due to the Dragon Medical One system interpretation.  Chart reviewed inclusive of vital signs, progress notes, laboratory results, and diagnostic images.  Per patients RN, Nena there are no significant concerns this morning.   I met with [name] [name] this morning. She is on bipap though easily arousable and coherent. Patient is feeling well overall, urinating consistently. Remains to have a good appetite.   [facility] has been consulted to support [name] [name] when she goes home from a Palliative care perspective.   Questions and concerns addressed/Palliative Support Provided.   Objective Assessment: Vital Signs Vitals:   02/21/23 2339 02/22/23 0619  BP: (!) 115/58 (!) 111/91  Pulse: 72 64  Resp: 13 11  Temp:    SpO2: 92% 96%    Intake/Output Summary (Last 24 hours) at 02/22/2023 0815 Last data filed at 02/22/2023 9380 Gross per 24 hour  Intake 120 ml  Output 2300 ml  Net -2180 ml   Last Weight  Most recent update: 02/21/2023  6:05 AM    Weight  125 kg (275 lb 9.2 oz)            Gen: Elderly obese African-American female chronically ill-appearing HEENT: moist mucous membranes CV: Regular rate and rhythm  PULM: On 2L nasal cannula breathing is even and unlabored ABD: soft/nontender  EXT: (+) edema  Neuro: Alert and oriented x2 -patient not clear on situation and  intermittently dosing off  SUMMARY OF RECOMMENDATIONS   Full Code/Full Scope of Care   Advanced Directives have been completed   Plan for discharge per primary care team with outpatient Palliative support  Billing based on MDM: Low ______________________________________________________________________________________ [name] [name] [place] Palliative Medicine Team Team Cell Phone: [phone] Please utilize secure chat with additional questions, if there is no response within 30 minutes please call the above phone number  Palliative Medicine Team providers are available by phone from 7am to 7pm daily and can be reached through the team cell phone.  Should this patient require assistance outside of these hours, please call the patient's attending physician.

## 2023-02-22 NOTE — Evaluation (Signed)
 Occupational Therapy Evaluation Patient Details Name: [name] [name] MRN: [id number] DOB: 1955-04-09 Today's Date: 02/22/2023   History of Present Illness The pt is a 68 yo female presenting 11/22 with SOB. PMH includes: HFrEF, chronic hypoxia on 4L (nonadherent), CKD III, DM II, HLD, OSA/OHS, class III obesity (BMI >40).   Clinical Impression   PTA, pt lives alone but has multiple family members that live nearby and 2 aides that assist daily. Pt uses hoyer lift for transfers OOB and assisted with LB ADLs bed level. Pt presents now at baseline for ADLs and able to assist with repositioning in bed w Setup Assist. Pt is currently working with HHPT on bed mobility and sitting EOB; would like to continue this at DC. Pt would benefit from further UE HEP education to maximize strength to assist with bed mobility and ADLs so will follow distantly for acute OT services. No OT needs upon discharge.        If plan is discharge home, recommend the following: A lot of help with bathing/dressing/bathroom;Two people to help with walking and/or transfers;A lot of help with walking and/or transfers    Functional Status Assessment  Patient has not had a recent decline in their functional status  Equipment Recommendations  None recommended by OT    Recommendations for Other Services       Precautions / Restrictions Precautions Precautions: Fall Restrictions Weight Bearing Restrictions Per Provider Order: No      Mobility Bed Mobility Overal bed mobility: Needs Assistance             General bed mobility comments: Setup assist to scoot up in bed pulling on headboard. did not attempt EOB without +2 assist and pt reporting preference to eat breakfast    Transfers                   General transfer comment: deferred; lift at baseline      Balance Overall balance assessment: No apparent balance deficits (not formally assessed)                                          ADL either performed or assessed with clinical judgement   ADL Overall ADL's : At baseline                                       General ADL Comments: able to assist with UB ADLs (washing face, hands in prep for breakfast setup), Total A for LB ADLs bed level. Able to scoot self up in bed using headboard. At baselne for ADLs     Vision Ability to See in Adequate Light: 0 Adequate Patient Visual Report: No change from baseline Vision Assessment?: No apparent visual deficits     Perception         Praxis         Pertinent Vitals/Pain Pain Assessment Pain Assessment: No/denies pain     Extremity/Trunk Assessment Upper Extremity Assessment Upper Extremity Assessment: Overall WFL for tasks assessed;Right hand dominant   Lower Extremity Assessment Lower Extremity Assessment: Defer to PT evaluation   Cervical / Trunk Assessment Cervical / Trunk Assessment: Normal   Communication Communication Communication: No apparent difficulties   Cognition Arousal: Alert Behavior During Therapy: WFL for tasks assessed/performed Overall Cognitive Status: Within Functional Limits  for tasks assessed                                       General Comments  Pt inquired about BiPAP for home use- message sent to CM/SW    Exercises     Shoulder Instructions      Home Living Family/patient expects to be discharged to:: Private residence Living Arrangements: Alone Available Help at Discharge: Personal care attendant;Friend(s);Family (near 24/7) Type of Home: Other(Comment) (condo) Home Access: Level entry     Home Layout: One level     Bathroom Shower/Tub: Sponge bathes at baseline   Bathroom Toilet: Standard Bathroom Accessibility: No   Home Equipment: Wheelchair - power;Hospital bed;Other (comment) (hoyer lift)   Additional Comments: She has home health aides 7 days per week for 3 hrs in the a.m. and 2 hrs in the p.m.      Prior  Functioning/Environment Prior Level of Function : Needs assist         Mobility (physical): Bed mobility;Transfers ADLs (physical): Bathing;Dressing;Toileting;IADLs Mobility Comments: pt not ambulated in years but is hopeful to start standing again. states she is able to sit EOB with assist and complete rolling in bed independently. using lift to get OOB but can propel power WC. Working with HHPT ADLs Comments: pt reports use of brief in chair, aides do all bathing and dressing in bed. able to assist with some UB ADL bed level        OT Problem List: Decreased strength;Decreased activity tolerance      OT Treatment/Interventions: Self-care/ADL training;Therapeutic exercise;Energy conservation;DME and/or AE instruction;Therapeutic activities;Patient/family education;Balance training    OT Goals(Current goals can be found in the care plan section) Acute Rehab OT Goals Patient Stated Goal: be able to stand again, obtain shoe to even out leg length discrepancy OT Goal Formulation: With patient Time For Goal Achievement: 03/08/23 Potential to Achieve Goals: Good  OT Frequency: Min 1X/week    Co-evaluation              AM-PAC OT 6 Clicks Daily Activity     Outcome Measure Help from another person eating meals?: None Help from another person taking care of personal grooming?: A Little Help from another person toileting, which includes using toliet, bedpan, or urinal?: Total Help from another person bathing (including washing, rinsing, drying)?: A Lot Help from another person to put on and taking off regular upper body clothing?: A Lot Help from another person to put on and taking off regular lower body clothing?: Total 6 Click Score: 13   End of Session Equipment Utilized During Treatment: Oxygen  Nurse Communication: Mobility status  Activity Tolerance: Patient tolerated treatment well Patient left: in bed;with call bell/phone within reach  OT Visit Diagnosis: Other  abnormalities of gait and mobility (R26.89)                Time: 9147-9094 OT Time Calculation (min): 13 min Charges:  OT General Charges $OT Visit: 1 Visit OT Evaluation $OT Eval Low Complexity: 1 Low  Mliss NOVAK, OTR/L Acute Rehab Services Office: [phone]   Mliss Getting 02/22/2023, 9:14 AM

## 2023-02-22 NOTE — Progress Notes (Signed)
 Placed patient on BIPAP for night rest.  Patient tolerating well.

## 2023-02-22 NOTE — Progress Notes (Signed)
 PROGRESS NOTE    [name] [name]  FMW:[id number] DOB: 03-Apr-1955 DOA: 02/15/2023 PCP: [facility]  68 y.o. female with a history of morbid obesity (BMI 55), OSA/OHS nonadherent to BiPAP, HTN, T2DM, stage IIIb CKD, functionally bed bound, multiple readmissions who presented to the ED on 02/15/2023 with confusion found to be severely hypoxemic, volume overloaded, placed on nonrebreather, blood glucose reportedly 18, pH 7.18, pCO2 85.3. Cr 5.5 with K of 6.2.  -Admitted, started on diuretics and BiPAP, nephrology following, kidney function improving  Subjective: Feels better overall, used BiPAP overnight, breathing continues to improve -Discussed CODE STATUS with the patient again, she would like to think about this further  Assessment and Plan:  Acute hypoxic hypercapnic resp failure  Acute on chronic HFpEF:, Suspected RV failure -Echo 11/24 significantly limited study, poor acoustic windows, EF 55%, RV could not be visualized  -Volume status is improving, 11.7 L negative, creatinine improving, on high-dose torsemide , will decrease dose at discharge -GDMT limited by CKD 4  - Continue midodrine , dose decreased - Appreciate nephrology assistance -Discharge planning, home tomorrow with home health services   AKI on stage IIIb CKD: Hyperphosphatemia -Likely cardiorenal, baseline creatinine around 1.6-2, on admission creatinine was 5.5 -Ultrasound without hydronephrosis -Improving with diuresis, now on torsemide , nephrology -Phos levels improving   Hyperkalemia:  - Resolved.    T2DM: With hypoglycemia.  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,  [facility] lift used, bedbound   DVT prophylaxis: Lovenox  Code Status: Full code Family Communication: None present Disposition Plan: Home likely tomorrow  Consultants:    Procedures:   Antimicrobials:    Objective: Vitals:   02/21/23 2339 02/22/23 0619 02/22/23 0701 02/22/23 0900  BP: (!) 115/58 (!) 111/91  (!) 142/90  Pulse: 72 64  70  Resp: 13 11  18   Temp:    (!) 97.4 F (36.3 C)  TempSrc:    Oral  SpO2: 92% 96%  94%  Weight:   124 kg   Height:        Intake/Output Summary (Last 24 hours) at 02/22/2023 1114 Last data filed at 02/22/2023 9380 Gross per 24 hour  Intake 120 ml  Output 2300 ml  Net -2180 ml   Filed Weights   02/15/23 1924 02/21/23 0600 02/22/23 0701  Weight: (!) 137.5 kg 125 kg 124 kg    Examination:  General exam: Morbidly obese chronically ill female laying in bed, AAOx3 HEENT: Neck obese unable to assess JVD CVS: S1-S2, regular rhythm Lungs: Distant breath sounds  Abdomen: Soft, nontender, bowel sounds present Extremities: Trace edema, significantly obese  skin: No rashes Psychiatry:  Mood & affect appropriate.     Data Reviewed:   CBC: Recent Labs  Lab 02/15/23 1949 02/15/23 1956 02/15/23 2136 02/16/23 0322 02/17/23 0319  WBC 4.9  --   --  4.9 4.6  NEUTROABS 3.7  --   --   --  2.7  HGB 15.1* 19.4* 18.0* 15.7* 14.8  HCT 55.2* 57.0* 53.0* 56.9* 52.7*  MCV 83.9  --   --  84.3 82.6  PLT 280  --   --  273 271   Basic Metabolic Panel:  Recent Labs  Lab 02/17/23 0319 02/18/23 0308 02/19/23 0239 02/20/23 0303 02/21/23 0402 02/22/23 0350  NA 139 141 143 138 144 142  K 4.6 4.3 4.0 3.7 3.6 3.6  CL 96* 99 99 96* 98 95*  CO2 29 32 32 34* 37* 36*  GLUCOSE 63* 59* 77 109* 105* 119*  BUN 93* 88* 78* 71* 59* 51*  CREATININE 4.80* 3.94* 3.27* 2.60* 2.24* 2.13*  CALCIUM  9.1 9.8 9.5 9.3 9.5 9.5  MG 3.8*  --   --   --   --   --   PHOS 7.3* 6.6* 4.9* 3.6 3.4 3.3   GFR: Estimated Creatinine Clearance: 31.4 mL/min (A) (by C-G formula  based on SCr of 2.13 mg/dL (H)). Liver Function Tests: Recent Labs  Lab 02/15/23 2058 02/17/23 0319 02/18/23 0308 02/19/23 0239 02/20/23 0303 02/21/23 0402 02/22/23 0350  AST 32 17  --   --   --   --   --   ALT 42 36  --   --   --   --   --   ALKPHOS 210* 191*  --   --   --   --   --   BILITOT 1.5* 1.0  --   --   --   --   --   PROT 8.3* 8.2*  --   --   --   --   --   ALBUMIN  3.8 3.8 3.6 3.3* 3.3* 3.0* 2.9*   Recent Labs  Lab 02/16/23 0322  LIPASE 30   No results for input(s): AMMONIA in the last 168 hours. Coagulation Profile: No results for input(s): INR, PROTIME in the last 168 hours. Cardiac Enzymes: No results for input(s): CKTOTAL, CKMB, CKMBINDEX, TROPONINI in the last 168 hours. BNP (last 3 results) No results for input(s): PROBNP in the last 8760 hours. HbA1C: No results for input(s): HGBA1C in the last 72 hours. CBG: Recent Labs  Lab 02/21/23 0602 02/21/23 1131 02/21/23 1626 02/21/23 2048 02/22/23 0548  GLUCAP 117* 109* 133* 137* 119*   Lipid Profile: No results for input(s): CHOL, HDL, LDLCALC, TRIG, CHOLHDL, LDLDIRECT in the last 72 hours. Thyroid Function Tests: No results for input(s): TSH, T4TOTAL, FREET4, T3FREE, THYROIDAB in the last 72 hours. Anemia Panel: No results for input(s): VITAMINB12, FOLATE, FERRITIN, TIBC, IRON, RETICCTPCT in the last 72 hours. Urine analysis:    Component Value Date/Time   COLORURINE YELLOW 02/16/2023 0800   APPEARANCEUR CLEAR 02/16/2023 0800   LABSPEC 1.012 02/16/2023 0800   PHURINE 5.0 02/16/2023 0800   GLUCOSEU NEGATIVE 02/16/2023 0800   HGBUR NEGATIVE 02/16/2023 0800   BILIRUBINUR NEGATIVE 02/16/2023 0800   KETONESUR NEGATIVE 02/16/2023 0800   PROTEINUR 30 (A) 02/16/2023 0800   UROBILINOGEN 0.2 06/28/2014 1500   NITRITE NEGATIVE 02/16/2023 0800   LEUKOCYTESUR NEGATIVE 02/16/2023 0800   Sepsis Labs: @LABRCNTIP (procalcitonin:4,lacticidven:4)  ) Recent  Results (from the past 240 hours)  Resp panel by RT-PCR (RSV, Flu A&B, Covid) Anterior Nasal Swab     Status: None   Collection Time: 02/15/23  8:15 PM   Specimen: Anterior Nasal Swab  Result Value Ref Range Status   SARS Coronavirus 2 by RT PCR NEGATIVE NEGATIVE Final   Influenza A by PCR NEGATIVE NEGATIVE Final   Influenza B by PCR NEGATIVE NEGATIVE Final    Comment: (NOTE) The Xpert Xpress SARS-CoV-2/FLU/RSV plus assay is intended as an aid in the diagnosis of influenza from Nasopharyngeal swab specimens and should not be used as a sole basis for  treatment. Nasal washings and aspirates are unacceptable for Xpert Xpress SARS-CoV-2/FLU/RSV testing.  Fact Sheet for Patients: bloggercourse.com  Fact Sheet for Healthcare Providers: seriousbroker.it  This test is not yet approved or cleared by the [place]  FDA and has been authorized for detection and/or diagnosis of SARS-CoV-2 by FDA under an Emergency Use Authorization (EUA). This EUA will remain in effect (meaning this test can be used) for the duration of the COVID-19 declaration under Section 564(b)(1) of the Act, 21 U.S.C. section 360bbb-3(b)(1), unless the authorization is terminated or revoked.     Resp Syncytial Virus by PCR NEGATIVE NEGATIVE Final    Comment: (NOTE) Fact Sheet for Patients: bloggercourse.com  Fact Sheet for Healthcare Providers: seriousbroker.it  This test is not yet approved or cleared by the [place]  FDA and has been authorized for detection and/or diagnosis of SARS-CoV-2 by FDA under an Emergency Use Authorization (EUA). This EUA will remain in effect (meaning this test can be used) for the duration of the COVID-19 declaration under Section 564(b)(1) of the Act, 21 U.S.C. section 360bbb-3(b)(1), unless the authorization is terminated or revoked.  Performed at [facility], [address] [place], [place] [address]   MRSA Next Gen by PCR, Nasal     Status: Abnormal   Collection Time: 02/16/23  9:58 AM   Specimen: Nasal Mucosa; Nasal Swab  Result Value Ref Range Status   MRSA by PCR Next Gen DETECTED (A) NOT DETECTED Final    Comment: RESULT CALLED TO, READ BACK BY AND VERIFIED WITH: RN VERONICA B 1440 [id number] FCP (NOTE) The GeneXpert MRSA Assay (FDA approved for NASAL specimens only), is one component of a comprehensive MRSA colonization surveillance program. It is not intended to diagnose MRSA infection nor to guide or monitor treatment for MRSA infections. Test performance is not FDA approved in patients less than 42 years old. Performed at [facility], [address] [place], [place] [address]      Radiology Studies: No results found.   Scheduled Meds:  allopurinol   50 mg Oral QODAY   atorvastatin   10 mg Oral QHS   enoxaparin  (LOVENOX ) injection  30 mg Subcutaneous Q24H   escitalopram   10 mg Oral q morning   insulin  aspart  0-9 Units Subcutaneous TID WC   midodrine   5 mg Oral TID WC   mirabegron  ER  25 mg Oral Daily   polyethylene glycol  17 g Oral Daily   pregabalin   50 mg Oral BID   torsemide   80 mg Oral BID   Continuous Infusions:   LOS: 6 days    Time spent:    Sigurd Pac, MD [facility]   02/22/2023, 11:14 AM

## 2023-02-22 NOTE — TOC Initial Note (Addendum)
 Transition of Care [facility]) - Initial/Assessment Note    Patient Details  Name: [name] [name] MRN: [id number] Date of Birth: November 15, 1955  [facility]) CM/SW Contact:    Stephane Powell Jansky, RN Phone Number: 02/22/2023, 9:34 AM  Clinical Narrative:                  Spoke to patient at bedside.   Patient from home. Confirmed address.   Patient states she has home oxygen  at 4L [place], CPAP, hoyer lift, motorized wheelchair   She has aides as well.   Per notes possible discharge today. Patient has called her home aide , who is available if patient discharged today.   Patient wants to continue services with [facility], they are aware discharge today. Asked MD for orders and face to face.   Palliative Care has been arranged with [facility].   Patient has a computer lock at home and can get in when [facility] takes her home.   Patient requesting BiPAP for home. [facility]ided [facility] phone number : [phone] . NCM called left message on Erin's voicemail . [facility] is closed [facility]. Per MD CPAP is sufficient   Secure chatted MD for orders for [facility] and if Md wants to enter orders for BiPAP ( will need settings) NCM can email order to [place].    Erin called back . She confirmed with RT that patient does have a BiPAP at home. Per Rocky it looks like a CPAP but is set on BiPAP settings and patient is noncompliant with it . Erin aware discharge is tomorrow and will call her to arrange appointment . Secure chatted MD    Patient will need [facility] home. Paperwork on chart . DC now tomorrow. [facility] paperwork will need to be updated  Expected Discharge Plan: Home w Home Health Services (Palliative  Care) Barriers to Discharge: Continued Medical Work up   Patient Goals and CMS Choice Patient states their goals for this hospitalization and ongoing recovery are:: return to home CMS Medicare.gov Compare Post Acute Care list provided to:: Patient Choice offered to / list presented to : Patient       Expected Discharge Plan and Services   Discharge Planning Services: CM Consult Post Acute Care Choice: Home Health Living arrangements for the past 2 months: Apartment                 DME Arranged:  (See note)         HH Arranged: PT, OT HH Agency: Other - See comment [place]) Date HH Agency Contacted: 02/22/23 Time HH Agency Contacted: [phone] Representative spoke with at [facility] Agency: Kiki need orders secure chatted MD  Prior Living Arrangements/Services Living arrangements for the past 2 months: Apartment Lives with:: Self Patient language and need for interpreter reviewed:: Yes Do you feel safe going back to the place where you live?: Yes      Need for Family Participation in Patient Care: Yes (Comment) Care giver support system in place?: Yes (comment) Current home services: DME, Home OT, Home PT, Homehealth aide Criminal Activity/Legal Involvement Pertinent to Current Situation/Hospitalization: No - Comment as needed  Activities of Daily Living   ADL Screening (condition at time of admission) Independently performs ADLs?: No Does the patient have a NEW difficulty with bathing/dressing/toileting/self-feeding that is expected to last >3 days?: Yes (Initiates electronic notice to provider for possible OT consult) Does the patient have a NEW difficulty with getting in/out of bed, walking, or climbing stairs that is  expected to last >3 days?: Yes (Initiates electronic notice to provider for possible PT consult) Does the patient have a NEW difficulty with communication that is expected to last >3 days?: No Is the patient deaf or have difficulty hearing?: No Does the patient have difficulty seeing, even when wearing glasses/contacts?: No Does the patient have difficulty concentrating, remembering, or making decisions?: Yes  Permission Sought/Granted   Permission granted to share information with : Yes, Verbal Permission Granted     Permission granted to share info w AGENCY:  [facility] , [place] , [facility]        Emotional Assessment Appearance:: Appears stated age Attitude/Demeanor/Rapport: Engaged Affect (typically observed): Accepting Orientation: : Oriented to Self, Oriented to Place, Oriented to  Time, Oriented to Situation Alcohol  / Substance Use: Not Applicable Psych Involvement: No (comment)  Admission diagnosis:  Hypercarbia [R06.89] AKI (acute kidney injury) (HCC) [N17.9] Altered mental status, unspecified altered mental status type [R41.82] Hypervolemia, unspecified hypervolemia type [E87.70] Acute hypoxic respiratory failure (HCC) [J96.01] Patient Active Problem List   Diagnosis Date Noted   Acute hypoxic respiratory failure (HCC) 02/16/2023   Acute on chronic hypoxic respiratory failure (HCC) 01/14/2023   Acute on chronic heart failure with preserved ejection fraction (HFpEF) (HCC) 01/13/2023   Chronic respiratory failure with hypoxia (HCC) 01/13/2023   Chronic kidney disease, stage 3a (HCC) 10/02/2022   Chronic kidney disease, stage 3b (HCC) 10/02/2022   Hyperglycemia due to type 2 diabetes mellitus (HCC) 09/26/2021   Type 2 diabetes mellitus with hyperglycemia, with long-term current use of insulin  (HCC) 09/25/2021   (HFpEF) heart failure with preserved ejection fraction (HCC) 09/25/2021   Hypomagnesemia 09/08/2021   Severe sepsis (HCC) 09/07/2021   Bacteremia due to Klebsiella pneumoniae 09/07/2021   Urinary tract infection 09/07/2021   Acute respiratory failure with hypoxia and hypercapnia (HCC) 09/04/2021   DKA (diabetic ketoacidosis) (HCC) 02/02/2021   Morbid obesity with BMI of 50.0-59.9, adult (HCC) 02/02/2021   Hyperglycemia 02/01/2021   Pancreatitis 09/07/2020   Intractable nausea and vomiting 09/07/2020   Functional quadriplegia (HCC) 09/07/2020   Physical debility 08/26/2020   OSA (obstructive sleep apnea) 08/26/2020   Constipation    AKI (acute kidney injury) (HCC) 08/25/2020   Acute renal failure superimposed on stage 3b chronic  kidney disease (HCC) 07/30/2020   Lower extremity cellulitis 02/04/2020   Acute on chronic diastolic heart failure (HCC) 11/23/2019   Vaginal discharge 11/06/2019   Morbid obesity (HCC) 08/22/2019   Opiate overdose (HCC) 08/20/2019   Cellulitis 10/05/2018   Acute cystitis without hematuria 10/05/2018   Essential hypertension 10/05/2018   Pressure injury of skin 10/05/2018   Hypoglycemia secondary to sulfonylurea 11/21/2016   Substance induced mood disorder (HCC) 07/02/2014   Encephalopathy    Hyperkalemia    Hypokalemia    Insulin  dependent type 2 diabetes mellitus (HCC)    Hyperlipidemia associated with type 2 diabetes mellitus (HCC)    Anxiety and depression    Anxiety state    PCP:  [facility] Pharmacy:   [id number] - [place], [place] - [address] AT [address] [place] [address] Phone: [phone] Fax: [phone]  [id number] - [place], [place] - [address] [place] [address] Phone: [phone] Fax: [phone]     Social Drivers of Health (SDOH) Social History: SDOH Screenings   Food Insecurity: Patient Declined (02/16/2023)  Housing: Unknown (02/16/2023)  Transportation Needs: Patient Unable To Answer (02/16/2023)  Utilities: Not At Risk (01/13/2023)  Financial Resource  Strain: Patient Declined (05/16/2022)   Received from [facility]  Social Connections: Unknown (05/13/2022)   Received from [facility]  Stress: No Stress Concern Present (05/16/2022)   Received from [facility]  Tobacco Use: Medium Risk (02/15/2023)   SDOH Interventions:     Readmission Risk Interventions    10/07/2022   11:37 AM 09/28/2021   11:23 AM 09/08/2021   10:20 AM  Readmission Risk Prevention Plan  Transportation Screening Complete Complete Complete  PCP or Specialist Appt within 3-5 Days Complete --   Home Care Screening   Complete  Medication Review (RN CM)   Complete  HRI or Home  Care Consult Complete Complete   Social Work Consult for Recovery Care Planning/Counseling Complete Complete   Palliative Care Screening Not Applicable Not Applicable   Medication Review Oceanographer) Complete Complete

## 2023-02-23 ENCOUNTER — Other Ambulatory Visit (HOSPITAL_COMMUNITY): Payer: Self-pay

## 2023-02-23 LAB — RENAL FUNCTION PANEL
Albumin: 3.1 g/dL — ABNORMAL LOW (ref 3.5–5.0)
Anion gap: 14 (ref 5–15)
BUN: 48 mg/dL — ABNORMAL HIGH (ref 8–23)
CO2: 35 mmol/L — ABNORMAL HIGH (ref 22–32)
Calcium: 9.4 mg/dL (ref 8.9–10.3)
Chloride: 95 mmol/L — ABNORMAL LOW (ref 98–111)
Creatinine, Ser: 2.52 mg/dL — ABNORMAL HIGH (ref 0.44–1.00)
GFR, Estimated: 20 mL/min — ABNORMAL LOW (ref >60)
Glucose, Bld: 151 mg/dL — ABNORMAL HIGH (ref 70–99)
Phosphorus: 3.6 mg/dL (ref 2.5–4.6)
Potassium: 4.3 mmol/L (ref 3.5–5.1)
Sodium: 144 mmol/L (ref 135–145)

## 2023-02-23 LAB — GLUCOSE, CAPILLARY
Glucose-Capillary: 149 mg/dL — ABNORMAL HIGH (ref 70–99)
Glucose-Capillary: 187 mg/dL — ABNORMAL HIGH (ref 70–99)

## 2023-02-23 MED ORDER — ENOXAPARIN SODIUM 40 MG/0.4ML IJ SOSY
40.0000 mg | PREFILLED_SYRINGE | INTRAMUSCULAR | Status: DC
  Administered 2023-02-23: 40 mg via SUBCUTANEOUS
  Filled 2023-02-23: qty 0.4

## 2023-02-23 MED ORDER — POTASSIUM CHLORIDE CRYS ER 20 MEQ PO TBCR
20.0000 meq | EXTENDED_RELEASE_TABLET | Freq: Every day | ORAL | 1 refills | Status: DC
  Filled 2023-02-23: qty 30, 30d supply, fill #0

## 2023-02-23 MED ORDER — TORSEMIDE 20 MG PO TABS
40.0000 mg | ORAL_TABLET | Freq: Two times a day (BID) | ORAL | 1 refills | Status: AC
  Filled 2023-02-23: qty 120, 30d supply, fill #0

## 2023-02-23 NOTE — Discharge Summary (Signed)
 Physician Discharge Summary  [name] [name] FMW:[id number] DOB: [birth year] DOA: 02/15/2023  PCP: [facility]  Admit date: 02/15/2023 Discharge date: 02/23/2023  Time spent: 45 minutes  Recommendations for Outpatient Follow-up:  [facility]  outpatient palliative care, needs ongoing goals of care discussions Home health PT OT RN Emphasized compliance with CPAP, continue CODE STATUS discussions   Discharge Diagnoses:  Principal Problem: Acute toxic and hypercarbic respiratory failure Morbid obesity OSA, OHS Significant debility, bedbound Acute on chronic diastolic CHF Suspected RV failure AKI on CKD 3B Hyperphosphatemia Hyperkalemia Type 2 diabetes mellitus Gout Depression    Discharge Condition: Stable  Diet recommendation: [place] DM, diabetic  Filed Weights   02/21/23 0600 02/22/23 0701 02/23/23 0551  Weight: 125 kg 124 kg 126.6 kg    History of present illness:  68 y.o. female with a history of morbid obesity (BMI 55), OSA/OHS nonadherent to BiPAP, HTN, T2DM, stage IIIb CKD, functionally bed bound, multiple readmissions who presented to the ED on 02/15/2023 with confusion found to be severely hypoxemic, volume overloaded, placed on nonrebreather, blood glucose reportedly 18, pH 7.18, pCO2 85.3. Cr 5.5 with K of 6.2.  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    T2DM: With hypoglycemia.  - off basal insulin  due to due to hypoglycemia -CBGs stable on sliding scale only at this time   Gout  - Continue allopurinol    HLD  - Continue atorvastatin      Depression  - Continue SSRI    Morbid obesity OSA OHS -BMI is 53 Continue nightly BiPAP here, on CPAP nightly at home with poor compliance   Debility, bedbound, nonambulatory -Has CNA's at home, [facility] lift used, bedbound  Discharge Exam: Vitals:   02/23/23 0551 02/23/23 0821  BP:  (!) 121/58  Pulse: 60 66  Resp: 14 17  Temp:  98 F (36.7 C)  SpO2: 92% 92%    General exam: Morbidly obese chronically ill female laying in bed, AAOx3 HEENT: Neck obese unable to assess JVD CVS: S1-S2, regular rhythm Lungs: Distant breath sounds  Abdomen: Soft, nontender, bowel sounds present Extremities: Trace edema, significantly obese  skin: No rashes Psychiatry:  Mood & affect appropriate.   Discharge Instructions   Discharge Instructions     Amb Referral to Palliative Care   Complete by: As directed    Diet - low sodium heart healthy   Complete by: As directed    Increase activity slowly   Complete by: As directed       Allergies as of 02/23/2023       Reactions   Lisinopril  Swelling   Morphine  Itching, Rash   Penicillins Hives, Itching, Nausea And Vomiting, Rash   Metformin Other (See Comments)   Sulfa Antibiotics Other (See Comments)        Medication List     STOP taking these medications    hydrocortisone  cream 1 %   hydrOXYzine  25 MG  capsule Commonly known as: VISTARIL    insulin  glargine-yfgn 100 UNIT/ML Pen Commonly known as: SEMGLEE    ketotifen  0.035 % ophthalmic solution Commonly known as: ZADITOR    Nyamyc  powder Generic drug: nystatin    nystatin  cream Commonly known as: MYCOSTATIN    Oxycodone  HCl 10 MG Tabs   Vitamin A & D Oint       TAKE these medications    acetaminophen  500 MG tablet Commonly known as: TYLENOL  Take 1,000 mg by mouth in the morning, at noon, and at bedtime.   albuterol  108 (90 Base) MCG/ACT inhaler Commonly known as: VENTOLIN  HFA Inhale 2 puffs into the lungs every 6 (six) hours as needed for wheezing or shortness of breath.   allopurinol  100 MG tablet Commonly known as: ZYLOPRIM  Take 50 mg by mouth every other day.   atorvastatin  10 MG tablet Commonly known as: LIPITOR Take 10 mg by mouth at bedtime.   cetirizine 10 MG tablet Commonly known as: ZYRTEC Take 10 mg by mouth daily.   Cholecalciferol  25 MCG (1000 UT) Tbdp Take 1 tablet by mouth daily.   desonide 0.05 % cream Commonly known as: DESOWEN Apply 1 Application topically 2 (two) times daily. For 2 weeks per month.   DIMETHICONE (TOPICAL) 5 % Crea Apply 1 Application topically daily as needed.   escitalopram  10 MG tablet Commonly known as: LEXAPRO  Take 1 tablet (10 mg total) by mouth every morning.   glucose 4 GM chewable tablet Chew 4 tablets by mouth See admin instructions. As needed (repeat every if blood sugar is less than 70)   magnesium  oxide 400 (240 Mg) MG tablet Commonly known as: MAG-OX Take 800 mg by mouth 2 (two) times daily.   melatonin 3 MG Tabs tablet Take 3 mg by mouth at bedtime.   midodrine  5 MG tablet Commonly known as: PROAMATINE  Take 1 tablet (5 mg total) by mouth 3 (three) times daily with meals.   mirabegron  ER 25 MG Tb24 tablet Commonly known as: MYRBETRIQ  Take 25 mg by mouth at bedtime.   mirtazapine  15 MG tablet Commonly known as: REMERON  Take 15 mg by mouth  at bedtime.   naloxone  4 MG/0.1ML Liqd nasal spray kit Commonly known as: NARCAN  Place 1 spray into the nose once. SPRAY 1 SPRAY INTO ONE NOSTRIL AS DIRECTED FOR OPIOID OVERDOSE - CALL 911 IMMEDIATELY, ADMINISTER DOSE, THEN TURN PERSON ON SIDE - IF NO RESPONSE IN 2-3 MINUTES OR PERSON RESPONDS BUT RELAPSES, REPEAT USING A NEW SPRAY DEVICE AND SPRAY INTO THE OTHER NOSTRIL   NovoLOG  FlexPen 100 UNIT/ML FlexPen Generic drug: insulin  aspart Inject 5 Units into the skin daily with supper.   ondansetron  4 MG tablet Commonly known as: ZOFRAN  Take 1 tablet (4 mg total) by mouth every 8 (eight) hours as needed for nausea or vomiting.   polyethylene glycol 17 g packet Commonly known as: MiraLax  Take 17 g by mouth daily. Also available over-the-counter   potassium chloride  SA 20 MEQ tablet Commonly known as: KLOR-CON  M Take 1 tablet (20 mEq total) by mouth daily.   pregabalin  75 MG capsule Commonly known as: LYRICA  Take 75 mg by mouth 2 (two) times daily. What  changed: Another medication with the same name was removed. Continue taking this medication, and follow the directions you see here.   Semaglutide (2 MG/DOSE) 8 MG/3ML Sopn Inject 2 mg into the skin once a week.   torsemide  20 MG tablet Commonly known as: DEMADEX  Take 2 tablets (40 mg total) by mouth 2 (two) times daily. What changed:  how much to take when to take this   Unifine Pentips 31G X 5 MM Misc Generic drug: Insulin  Pen Needle Use as directed daily       Allergies  Allergen Reactions   Lisinopril Swelling   Morphine  Itching and Rash   Penicillins Hives, Itching, Nausea And Vomiting and Rash   Metformin Other (See Comments)   Sulfa Antibiotics Other (See Comments)    Follow-up Information     [facility] Follow up.   Specialty: Home Health Services Why: HHPT/OT- will resume services Contact information: [address] [place] [address] [phone]          [facility] Follow up.   Why: referral for outpt palliative/serious illness program- they will contact you post discharge to follow up Contact information: [address] [facility]  [address] [phone]                 The results of significant diagnostics from this hospitalization (including imaging, microbiology, ancillary and laboratory) are listed below for reference.    Significant Diagnostic Studies: US  RENAL Result Date: 02/16/2023 CLINICAL DATA:  Acute kidney injury.  ATN. EXAM: RENAL / URINARY TRACT ULTRASOUND COMPLETE COMPARISON:  Ultrasound 01/13/2023. CT 10/02/2022. FINDINGS: Right Kidney: Renal measurements: 11.1 x 5.7 x 4.9 cm = volume: 162.6 mL. No collecting system dilatation or perinephric fluid. There is echogenic area towards the upper pole with a heterogeneous appearance on Doppler. No shadowing. This could be an artifact. Small high density areas seen in this location on prior CT. Left Kidney: Not well seen with overlapping bowel gas and soft tissue. Bladder: Appears normal for degree of bladder distention. Other: Study is limited overall due to bowel gas and soft tissue. IMPRESSION: Left kidney is not clearly identified on today's examination and is not evaluated. No right-sided collecting system dilatation. Study limited significantly by overlapping bowel gas and soft tissue. Electronically Signed   By: Ranell Bring M.D.   On: 02/16/2023 14:30   DG Chest Portable 1 View Result Date: 02/15/2023 CLINICAL DATA:  Shortness of breath. EXAM: PORTABLE CHEST 1 VIEW COMPARISON:  January 13, 2023 FINDINGS: There is stable mild to moderate severity cardiac silhouette enlargement. Mild prominence of the central pulmonary vasculature is seen without evidence of frank pulmonary edema. Low lung volumes are noted with mild bilateral infrahilar atelectasis. No pleural effusion or pneumothorax is identified. The visualized skeletal structures are  unremarkable. IMPRESSION: 1. Stable cardiomegaly with mild pulmonary vascular congestion. 2. Low lung volumes with mild bilateral infrahilar atelectasis. Electronically Signed   By: Suzen Dials M.D.   On: 02/15/2023 20:10    Microbiology: Recent Results (from the past 240 hours)  Resp panel by RT-PCR (RSV, Flu A&B, Covid) Anterior Nasal Swab     Status: None   Collection Time: 02/15/23  8:15 PM   Specimen: Anterior Nasal Swab  Result Value Ref Range Status   SARS Coronavirus 2 by RT PCR NEGATIVE NEGATIVE Final   Influenza A by PCR NEGATIVE NEGATIVE Final   Influenza B by PCR NEGATIVE NEGATIVE Final    Comment: (NOTE) The Xpert Xpress  SARS-CoV-2/FLU/RSV plus assay is intended as an aid in the diagnosis of influenza from Nasopharyngeal swab specimens and should not be used as a sole basis for treatment. Nasal washings and aspirates are unacceptable for Xpert Xpress SARS-CoV-2/FLU/RSV testing.  Fact Sheet for Patients: bloggercourse.com  Fact Sheet for Healthcare Providers: seriousbroker.it  This test is not yet approved or cleared by the [place]  FDA and has been authorized for detection and/or diagnosis of SARS-CoV-2 by FDA under an Emergency Use Authorization (EUA). This EUA will remain in effect (meaning this test can be used) for the duration of the COVID-19 declaration under Section 564(b)(1) of the Act, 21 U.S.C. section 360bbb-3(b)(1), unless the authorization is terminated or revoked.     Resp Syncytial Virus by PCR NEGATIVE NEGATIVE Final    Comment: (NOTE) Fact Sheet for Patients: bloggercourse.com  Fact Sheet for Healthcare Providers: seriousbroker.it  This test is not yet approved or cleared by the [place]  FDA and has been authorized for detection and/or diagnosis of SARS-CoV-2 by FDA under an Emergency Use Authorization (EUA). This EUA will  remain in effect (meaning this test can be used) for the duration of the COVID-19 declaration under Section 564(b)(1) of the Act, 21 U.S.C. section 360bbb-3(b)(1), unless the authorization is terminated or revoked.  Performed at [facility], [address] [place], [place] [address]   MRSA Next Gen by PCR, Nasal     Status: Abnormal   Collection Time: 02/16/23  9:58 AM   Specimen: Nasal Mucosa; Nasal Swab  Result Value Ref Range Status   MRSA by PCR Next Gen DETECTED (A) NOT DETECTED Final    Comment: RESULT CALLED TO, READ BACK BY AND VERIFIED WITH: RN VERONICA B 1440 [id number] FCP (NOTE) The GeneXpert MRSA Assay (FDA approved for NASAL specimens only), is one component of a comprehensive MRSA colonization surveillance program. It is not intended to diagnose MRSA infection nor to guide or monitor treatment for MRSA infections. Test performance is not FDA approved in patients less than 43 years old. Performed at [facility], [address] [place], [place] [address]      Labs: Basic Metabolic Panel: Recent Labs  Lab 02/17/23 0319 02/18/23 0308 02/19/23 0239 02/20/23 0303 02/21/23 0402 02/22/23 0350 02/23/23 0324  NA 139   < > 143 138 144 142 144  K 4.6   < > 4.0 3.7 3.6 3.6 4.3  CL 96*   < > 99 96* 98 95* 95*  CO2 29   < > 32 34* 37* 36* 35*  GLUCOSE 63*   < > 77 109* 105* 119* 151*  BUN 93*   < > 78* 71* 59* 51* 48*  CREATININE 4.80*   < > 3.27* 2.60* 2.24* 2.13* 2.52*  CALCIUM  9.1   < > 9.5 9.3 9.5 9.5 9.4  MG 3.8*  --   --   --   --   --   --   PHOS 7.3*   < > 4.9* 3.6 3.4 3.3 3.6   < > = values in this interval not displayed.   Liver Function Tests: Recent Labs  Lab 02/17/23 0319 02/18/23 0308 02/19/23 0239 02/20/23 0303 02/21/23 0402 02/22/23 0350 02/23/23 0324  AST 17  --   --   --   --   --   --   ALT 36  --   --   --   --   --   --   ALKPHOS 191*  --   --   --   --   --   --  BILITOT 1.0  --   --   --   --   --   --   PROT 8.2*  --    --   --   --   --   --   ALBUMIN  3.8   < > 3.3* 3.3* 3.0* 2.9* 3.1*   < > = values in this interval not displayed.   No results for input(s): LIPASE, AMYLASE in the last 168 hours. No results for input(s): AMMONIA in the last 168 hours. CBC: Recent Labs  Lab 02/17/23 0319  WBC 4.6  NEUTROABS 2.7  HGB 14.8  HCT 52.7*  MCV 82.6  PLT 271   Cardiac Enzymes: No results for input(s): CKTOTAL, CKMB, CKMBINDEX, TROPONINI in the last 168 hours. BNP: BNP (last 3 results) Recent Labs    01/13/23 1811 02/15/23 2105  BNP 334.2* 432.5*    ProBNP (last 3 results) No results for input(s): PROBNP in the last 8760 hours.  CBG: Recent Labs  Lab 02/22/23 0548 02/22/23 1126 02/22/23 1638 02/22/23 2117 02/23/23 0607  GLUCAP 119* 150* 154* 144* 149*       Signed:  Sigurd Pac MD.  [facility] 02/23/2023, 12:04 PM

## 2023-02-23 NOTE — TOC Transition Note (Signed)
 Transition of Care [facility]) - Discharge Note Rayfield Gobble RN, BSN Transitions of Care Unit 4E- RN Case Manager See Treatment Team for direct phone #   Patient Details  Name: [name] [name] MRN: [id number] Date of Birth: Jun 28, 1955  Transition of Care [facility]) CM/SW Contact:  Gobble Rayfield Hurst, RN Phone Number: 02/23/2023, 11:48 AM   Clinical Narrative:    Pt stable for transition home today, Orders placed for HHRN/PT/OT- Pt active with [name]- liaison- Calvin notified of new orders for resumption of services.   Pt to return home via EMS- [facility] called for transport- per dispatch- ETA for pickup is within the hour. Paperwork placed on chart and bedside RN aware.   No further TOC needs noted. Pt has needed DME at home and aides to assist. Pt has spoken with aide regarding return home.    Final next level of care: Home w Home Health Services Barriers to Discharge: Barriers Resolved   Patient Goals and CMS Choice Patient states their goals for this hospitalization and ongoing recovery are:: return to home CMS Medicare.gov Compare Post Acute Care list provided to:: Patient Choice offered to / list presented to : Patient      Discharge Placement               Home w/ [facility]        Discharge Plan and Services Additional resources added to the After Visit Summary for     Discharge Planning Services: CM Consult Post Acute Care Choice: Home Health          DME Arranged:  (See note) DME Agency: NA       HH Arranged: RN, PT, OT HH Agency: [name] Date [facility] Agency Contacted: 02/22/23 Time HH Agency Contacted: [phone] Representative spoke with at [facility] Agency: Kiki need orders secure chatted MD  Social Drivers of Health (SDOH) Interventions SDOH Screenings   Food Insecurity: Patient Declined (02/16/2023)  Housing: Unknown (02/16/2023)  Transportation Needs: Patient Unable To Answer (02/16/2023)  Utilities: Not At Risk (01/13/2023)  Financial Resource Strain:  Patient Declined (05/16/2022)   [facility]  Social Connections: Unknown (02/23/2023)  Stress: No Stress Concern Present (05/16/2022)   Received from [facility]  Tobacco Use: Medium Risk (02/15/2023)     Readmission Risk Interventions    02/23/2023   11:48 AM 10/07/2022   11:37 AM 09/28/2021   11:23 AM  Readmission Risk Prevention Plan  Transportation Screening Complete Complete Complete  PCP or Specialist Appt within 3-5 Days Complete Complete --  HRI or Home Care Consult Complete Complete Complete  Social Work Consult for Recovery Care Planning/Counseling Complete Complete Complete  Palliative Care Screening Not Applicable Not Applicable Not Applicable  Medication Review Oceanographer) Complete Complete Complete

## 2023-03-08 ENCOUNTER — Emergency Department (HOSPITAL_COMMUNITY)
Admission: EM | Admit: 2023-03-08 | Discharge: 2023-03-08 | Disposition: A | Discharge status: Home / Self Care | Payer: Non-veteran care | Attending: Emergency Medicine | Admitting: Emergency Medicine

## 2023-03-08 ENCOUNTER — Encounter (HOSPITAL_COMMUNITY): Payer: Self-pay

## 2023-03-08 ENCOUNTER — Other Ambulatory Visit: Payer: Self-pay

## 2023-03-08 DIAGNOSIS — I509 Heart failure, unspecified: Secondary | ICD-10-CM | POA: Insufficient documentation

## 2023-03-08 DIAGNOSIS — I110 Hypertensive heart disease with heart failure: Secondary | ICD-10-CM | POA: Diagnosis not present

## 2023-03-08 DIAGNOSIS — Z794 Long term (current) use of insulin: Secondary | ICD-10-CM | POA: Diagnosis not present

## 2023-03-08 DIAGNOSIS — Z79899 Other long term (current) drug therapy: Secondary | ICD-10-CM | POA: Diagnosis not present

## 2023-03-08 DIAGNOSIS — E119 Type 2 diabetes mellitus without complications: Secondary | ICD-10-CM | POA: Diagnosis not present

## 2023-03-08 DIAGNOSIS — R531 Weakness: Secondary | ICD-10-CM | POA: Diagnosis not present

## 2023-03-08 DIAGNOSIS — Z7401 Bed confinement status: Secondary | ICD-10-CM | POA: Diagnosis not present

## 2023-03-08 DIAGNOSIS — R040 Epistaxis: Secondary | ICD-10-CM | POA: Diagnosis not present

## 2023-03-08 LAB — CBC WITH DIFFERENTIAL/PLATELET
Abs Immature Granulocytes: 0.04 10*3/uL (ref 0.00–0.07)
Basophils Absolute: 0.1 10*3/uL (ref 0.0–0.1)
Basophils Relative: 1 %
Eosinophils Absolute: 0.2 10*3/uL (ref 0.0–0.5)
Eosinophils Relative: 3 %
HCT: 52.6 % — ABNORMAL HIGH (ref 36.0–46.0)
Hemoglobin: 14.4 g/dL (ref 12.0–15.0)
Immature Granulocytes: 1 %
Lymphocytes Relative: 22 %
Lymphs Abs: 1.8 10*3/uL (ref 0.7–4.0)
MCH: 23.3 pg — ABNORMAL LOW (ref 26.0–34.0)
MCHC: 27.4 g/dL — ABNORMAL LOW (ref 30.0–36.0)
MCV: 85.1 fL (ref 80.0–100.0)
Monocytes Absolute: 0.7 10*3/uL (ref 0.1–1.0)
Monocytes Relative: 8 %
Neutro Abs: 5.5 10*3/uL (ref 1.7–7.7)
Neutrophils Relative %: 65 %
Platelets: 344 10*3/uL (ref 150–400)
RBC: 6.18 MIL/uL — ABNORMAL HIGH (ref 3.87–5.11)
RDW: 22.8 % — ABNORMAL HIGH (ref 11.5–15.5)
WBC: 8.3 10*3/uL (ref 4.0–10.5)
nRBC: 0 % (ref 0.0–0.2)

## 2023-03-08 MED ORDER — OXYMETAZOLINE HCL 0.05 % NA SOLN
1.0000 | Freq: Once | NASAL | Status: AC
  Administered 2023-03-08: 1 via NASAL
  Filled 2023-03-08: qty 30

## 2023-03-08 NOTE — ED Notes (Signed)
 [facility] called for transport.

## 2023-03-08 NOTE — ED Triage Notes (Signed)
 Patient brought in by EMS due to nosebleeds X 3 days and black, tarry stools X last night. Pt is bed bound and wears 4L [place]. Per report, patient does not take any blood thinners. No other complaints.

## 2023-03-08 NOTE — ED Provider Notes (Signed)
 [place] EMERGENCY DEPARTMENT AT [facility] Provider Note   CSN: [id number] Arrival date & time: 03/08/23  1137     History  Chief Complaint  Patient presents with   Epistaxis    [name] [name] is a 68 y.o. female.  Patient is a 67 year old female with a history of diabetes, hypertension, CHF, OSA on BiPAP and on chronic 4 L of oxygen  daily who is bedbound due to morbid obesity with home palliative care services and recent admission to the hospital on 02/23/2023 who is presenting today from home due to a persistent nosebleed.  Patient reports the nosebleed started approximately 3 days ago.  It always comes out of the left nare and has been bleeding fairly constantly.  She has been swallowing a lot of blood and has started noticing that her stools are also black and tarry.  She has not had any abdominal pain or vomiting.  She does wear oxygen  regularly but reports never having a nosebleed prior to this.  She does not use any nasal sprays regularly and had not used anything in her nose but reports that she called the nurse today and she was unable to get anything to use and because of the ongoing bleeding they sent her here for evaluation.  Patient did receive Afrin on the way here and reports the nosebleed seems to have stopped.  Patient does not take anticoagulation.  The history is provided by the patient.  Epistaxis      Home Medications Prior to Admission medications   Medication Sig Start Date End Date Taking? Authorizing Provider  acetaminophen  (TYLENOL ) 500 MG tablet Take 1,000 mg by mouth in the morning, at noon, and at bedtime. 07/24/20   [provider]  albuterol  (VENTOLIN  HFA) 108 (90 Base) MCG/ACT inhaler Inhale 2 puffs into the lungs every 6 (six) hours as needed for wheezing or shortness of breath. 08/06/20   Audria Leather, MD  allopurinol  (ZYLOPRIM ) 100 MG tablet Take 50 mg by mouth every other day. 12/08/22   [provider]  atorvastatin   (LIPITOR) 10 MG tablet Take 10 mg by mouth at bedtime.    [provider]  cetirizine (ZYRTEC) 10 MG tablet Take 10 mg by mouth daily. 11/20/20   [provider]  Cholecalciferol  25 MCG (1000 UT) TBDP Take 1 tablet by mouth daily. 09/19/22   [provider]  desonide (DESOWEN) 0.05 % cream Apply 1 Application topically 2 (two) times daily. For 2 weeks per month. 09/16/22   [provider]  DIMETHICONE, TOPICAL, 5 % CREA Apply 1 Application topically daily as needed. 04/08/22   [provider]  escitalopram  (LEXAPRO ) 10 MG tablet Take 1 tablet (10 mg total) by mouth every morning. 08/01/21   Reed, Tiffany L, DO  glucose 4 GM chewable tablet Chew 4 tablets by mouth See admin instructions. As needed (repeat every if blood sugar is less than 70) 04/22/22   [provider]  insulin  aspart (NOVOLOG  FLEXPEN) 100 UNIT/ML FlexPen Inject 5 Units into the skin daily with supper.    [provider]  Insulin  Pen Needle 31G X 5 MM MISC Use as directed daily 02/03/21   Daren Eck, DO  magnesium  oxide (MAG-OX) 400 (240 Mg) MG tablet Take 800 mg by mouth 2 (two) times daily.    [provider]  melatonin 3 MG TABS tablet Take 3 mg by mouth at bedtime. 11/22/22   [provider]  midodrine  (PROAMATINE ) 5 MG tablet Take  1 tablet (5 mg total) by mouth 3 (three) times daily with meals. 01/17/23   Rai, Hurman Maiden, MD  mirabegron  ER (MYRBETRIQ ) 25 MG TB24 tablet Take 25 mg by mouth at bedtime. 06/29/22   [provider]  mirtazapine  (REMERON ) 15 MG tablet Take 15 mg by mouth at bedtime. 11/06/20   [provider]  naloxone  (NARCAN ) nasal spray 4 mg/0.1 mL Place 1 spray into the nose once. SPRAY 1 SPRAY INTO ONE NOSTRIL AS DIRECTED FOR OPIOID OVERDOSE - CALL 911 IMMEDIATELY, ADMINISTER DOSE, THEN TURN PERSON ON SIDE - IF NO RESPONSE IN 2-3 MINUTES OR PERSON RESPONDS BUT RELAPSES, REPEAT USING A NEW SPRAY DEVICE AND SPRAY INTO  THE OTHER NOSTRIL 10/13/22   [provider]  ondansetron  (ZOFRAN ) 4 MG tablet Take 1 tablet (4 mg total) by mouth every 8 (eight) hours as needed for nausea or vomiting. 01/17/23   Rai, Hurman Maiden, MD  polyethylene glycol (MIRALAX ) 17 g packet Take 17 g by mouth daily. Also available over-the-counter 01/17/23   Rai, Hurman Maiden, MD  potassium chloride  SA (KLOR-CON  M) 20 MEQ tablet Take 1 tablet (20 mEq total) by mouth daily. 02/23/23   Deforest Fast, MD  pregabalin  (LYRICA ) 75 MG capsule Take 75 mg by mouth 2 (two) times daily.    [provider]  Semaglutide, 2 MG/DOSE, 8 MG/3ML SOPN Inject 2 mg into the skin once a week. 10/20/22   [provider]  torsemide  (DEMADEX ) 20 MG tablet Take 2 tablets (40 mg total) by mouth 2 (two) times daily. 02/23/23   Deforest Fast, MD      Allergies    Lisinopril, Morphine , Penicillins, Metformin, and Sulfa antibiotics    Review of Systems   Review of Systems  HENT:  Positive for nosebleeds.     Physical Exam Updated Vital Signs BP (!) 140/78 (BP Location: Left Arm)   Pulse 99   Temp 98.4 F (36.9 C) (Oral)   Resp 20   Ht 5\' 2"  (1.575 m)   Wt 126.6 kg   SpO2 97%   BMI 51.05 kg/m  Physical Exam Vitals and nursing note reviewed.  Constitutional:      Appearance: Normal appearance.  HENT:     Head: Normocephalic.     Nose:     Comments: Dried epistaxis in the left nare.  No ulceration or focal point of the bleeding noted.  No active bleeding at this time.  Right nares normal.    Mouth/Throat:     Mouth: Mucous membranes are moist.     Comments: Dried blood on the lips but no posterior pharyngeal bleeding. Cardiovascular:     Rate and Rhythm: Normal rate.  Pulmonary:     Effort: Pulmonary effort is normal.  Abdominal:     General: Abdomen is flat.  Musculoskeletal:     Right lower leg: No edema.     Left lower leg: No edema.  Neurological:     Mental Status: She is alert. Mental status is at baseline.     ED  Results / Procedures / Treatments   Labs (all labs ordered are listed, but only abnormal results are displayed) Labs Reviewed  CBC WITH DIFFERENTIAL/PLATELET - Abnormal; Notable for the following components:      Result Value   RBC 6.18 (*)    HCT 52.6 (*)    MCH 23.3 (*)    MCHC 27.4 (*)    RDW 22.8 (*)    All other components within normal limits  EKG None  Radiology No results found.  Procedures Procedures    Medications Ordered in ED Medications  oxymetazoline  (AFRIN) 0.05 % nasal spray 1 spray (1 spray Each Nare Given 03/08/23 1358)    ED Course/ Medical Decision Making/ A&P                                 Medical Decision Making Amount and/or Complexity of Data Reviewed Labs: ordered. Decision-making details documented in ED Course.  Risk OTC drugs.   Pt with multiple medical problems and comorbidities and presenting today with a complaint that caries a high risk for morbidity and mortality.  Today with recurrent nosebleeds over the last 3 days.  Patient does report having black tarry stools however suspect that is from swallowing the blood from her epistaxis.  She does not take any anticoagulants.  Blood pressure is normal.  Will check hemoglobin due to the recent blood loss.  Will evaluate the nare to ensure there is nothing that can be cauterized.  Patient did receive Afrin prior to arrival does not appear to have any bleeding at this time.  3:01 PM No bleeding at this time or lesion that can be cauterized.  Will do Afrin twice a day for the next 3 days and Aquaphor inside the nose.  Most likely the skin has been cracked from being in a heated home and wearing oxygen  constantly.  I independently interpreted patient's labs and CBC with hb of 14. And otherwise look stable for discharge.         Final Clinical Impression(s) / ED Diagnoses Final diagnoses:  Left-sided epistaxis    Rx / DC Orders ED Discharge Orders     None         Almond Army, MD 03/08/23 1501

## 2023-03-08 NOTE — Discharge Instructions (Addendum)
 Blood counts today are normal.  14.  You can use the afrin spray in both nostril 2 times a day for the next 3 days and then stop.  Also place Aquaphor or Vaseline inside your nose to keep it moist.  Put it in approximately 30 minutes after using the nasal spray.  You can follow-up with ear nose and throat as needed if you continue to have bleeding.

## 2023-03-15 ENCOUNTER — Telehealth: Payer: Self-pay

## 2023-03-15 NOTE — Telephone Encounter (Signed)
ATC pt X1. Lvm asking for call back. I was unable to find a dl for the pt for her OSA. Pt is coming tomorrow for a hospital f/u, but we have not seen this pt before and I am unable to call a DME  Company since I don't know which one she uses.

## 2023-03-16 ENCOUNTER — Inpatient Hospital Stay: Payer: No Typology Code available for payment source | Admitting: Primary Care

## 2023-03-27 ENCOUNTER — Emergency Department (HOSPITAL_COMMUNITY): Payer: No Typology Code available for payment source

## 2023-03-27 ENCOUNTER — Inpatient Hospital Stay (HOSPITAL_COMMUNITY)
Admission: EM | Admit: 2023-03-27 | Discharge: 2023-04-06 | DRG: 189 | Disposition: A | Discharge status: Home Health | Payer: No Typology Code available for payment source | Attending: Internal Medicine | Admitting: Internal Medicine

## 2023-03-27 DIAGNOSIS — N179 Acute kidney failure, unspecified: Secondary | ICD-10-CM | POA: Diagnosis present

## 2023-03-27 DIAGNOSIS — F419 Anxiety disorder, unspecified: Secondary | ICD-10-CM | POA: Diagnosis present

## 2023-03-27 DIAGNOSIS — Z7189 Other specified counseling: Secondary | ICD-10-CM | POA: Diagnosis not present

## 2023-03-27 DIAGNOSIS — I50813 Acute on chronic right heart failure: Secondary | ICD-10-CM | POA: Diagnosis present

## 2023-03-27 DIAGNOSIS — E662 Morbid (severe) obesity with alveolar hypoventilation: Secondary | ICD-10-CM | POA: Diagnosis present

## 2023-03-27 DIAGNOSIS — I5032 Chronic diastolic (congestive) heart failure: Secondary | ICD-10-CM | POA: Diagnosis present

## 2023-03-27 DIAGNOSIS — E1169 Type 2 diabetes mellitus with other specified complication: Secondary | ICD-10-CM | POA: Diagnosis present

## 2023-03-27 DIAGNOSIS — Z888 Allergy status to other drugs, medicaments and biological substances status: Secondary | ICD-10-CM

## 2023-03-27 DIAGNOSIS — E1140 Type 2 diabetes mellitus with diabetic neuropathy, unspecified: Secondary | ICD-10-CM | POA: Diagnosis present

## 2023-03-27 DIAGNOSIS — Z794 Long term (current) use of insulin: Secondary | ICD-10-CM | POA: Diagnosis not present

## 2023-03-27 DIAGNOSIS — I5A Non-ischemic myocardial injury (non-traumatic): Secondary | ICD-10-CM | POA: Diagnosis present

## 2023-03-27 DIAGNOSIS — R609 Edema, unspecified: Secondary | ICD-10-CM | POA: Diagnosis not present

## 2023-03-27 DIAGNOSIS — J9602 Acute respiratory failure with hypercapnia: Secondary | ICD-10-CM

## 2023-03-27 DIAGNOSIS — N19 Unspecified kidney failure: Secondary | ICD-10-CM | POA: Diagnosis not present

## 2023-03-27 DIAGNOSIS — G4733 Obstructive sleep apnea (adult) (pediatric): Secondary | ICD-10-CM | POA: Diagnosis present

## 2023-03-27 DIAGNOSIS — I5030 Unspecified diastolic (congestive) heart failure: Secondary | ICD-10-CM | POA: Diagnosis present

## 2023-03-27 DIAGNOSIS — N1832 Chronic kidney disease, stage 3b: Secondary | ICD-10-CM | POA: Diagnosis present

## 2023-03-27 DIAGNOSIS — R0602 Shortness of breath: Secondary | ICD-10-CM | POA: Diagnosis present

## 2023-03-27 DIAGNOSIS — E785 Hyperlipidemia, unspecified: Secondary | ICD-10-CM | POA: Diagnosis present

## 2023-03-27 DIAGNOSIS — Z882 Allergy status to sulfonamides status: Secondary | ICD-10-CM

## 2023-03-27 DIAGNOSIS — I272 Pulmonary hypertension, unspecified: Secondary | ICD-10-CM | POA: Diagnosis present

## 2023-03-27 DIAGNOSIS — R531 Weakness: Secondary | ICD-10-CM

## 2023-03-27 DIAGNOSIS — R570 Cardiogenic shock: Secondary | ICD-10-CM | POA: Diagnosis present

## 2023-03-27 DIAGNOSIS — M7989 Other specified soft tissue disorders: Secondary | ICD-10-CM | POA: Diagnosis not present

## 2023-03-27 DIAGNOSIS — Z885 Allergy status to narcotic agent status: Secondary | ICD-10-CM

## 2023-03-27 DIAGNOSIS — R578 Other shock: Secondary | ICD-10-CM | POA: Diagnosis not present

## 2023-03-27 DIAGNOSIS — Z7401 Bed confinement status: Secondary | ICD-10-CM

## 2023-03-27 DIAGNOSIS — Z6841 Body Mass Index (BMI) 40.0 and over, adult: Secondary | ICD-10-CM | POA: Diagnosis not present

## 2023-03-27 DIAGNOSIS — J9621 Acute and chronic respiratory failure with hypoxia: Secondary | ICD-10-CM | POA: Diagnosis present

## 2023-03-27 DIAGNOSIS — N184 Chronic kidney disease, stage 4 (severe): Secondary | ICD-10-CM

## 2023-03-27 DIAGNOSIS — I9589 Other hypotension: Secondary | ICD-10-CM | POA: Diagnosis present

## 2023-03-27 DIAGNOSIS — Z833 Family history of diabetes mellitus: Secondary | ICD-10-CM

## 2023-03-27 DIAGNOSIS — Z87891 Personal history of nicotine dependence: Secondary | ICD-10-CM

## 2023-03-27 DIAGNOSIS — G8929 Other chronic pain: Secondary | ICD-10-CM | POA: Diagnosis present

## 2023-03-27 DIAGNOSIS — E8729 Other acidosis: Secondary | ICD-10-CM | POA: Diagnosis present

## 2023-03-27 DIAGNOSIS — E875 Hyperkalemia: Secondary | ICD-10-CM

## 2023-03-27 DIAGNOSIS — G9341 Metabolic encephalopathy: Secondary | ICD-10-CM | POA: Diagnosis present

## 2023-03-27 DIAGNOSIS — I50811 Acute right heart failure: Secondary | ICD-10-CM | POA: Diagnosis not present

## 2023-03-27 DIAGNOSIS — E1122 Type 2 diabetes mellitus with diabetic chronic kidney disease: Secondary | ICD-10-CM | POA: Diagnosis present

## 2023-03-27 DIAGNOSIS — Z515 Encounter for palliative care: Secondary | ICD-10-CM

## 2023-03-27 DIAGNOSIS — I130 Hypertensive heart and chronic kidney disease with heart failure and stage 1 through stage 4 chronic kidney disease, or unspecified chronic kidney disease: Secondary | ICD-10-CM | POA: Diagnosis present

## 2023-03-27 DIAGNOSIS — G9340 Encephalopathy, unspecified: Secondary | ICD-10-CM | POA: Diagnosis not present

## 2023-03-27 DIAGNOSIS — D631 Anemia in chronic kidney disease: Secondary | ICD-10-CM | POA: Diagnosis present

## 2023-03-27 DIAGNOSIS — J9622 Acute and chronic respiratory failure with hypercapnia: Secondary | ICD-10-CM

## 2023-03-27 DIAGNOSIS — F32A Depression, unspecified: Secondary | ICD-10-CM | POA: Diagnosis present

## 2023-03-27 DIAGNOSIS — Z7985 Long-term (current) use of injectable non-insulin antidiabetic drugs: Secondary | ICD-10-CM

## 2023-03-27 DIAGNOSIS — Z91199 Patient's noncompliance with other medical treatment and regimen due to unspecified reason: Secondary | ICD-10-CM

## 2023-03-27 DIAGNOSIS — E1165 Type 2 diabetes mellitus with hyperglycemia: Secondary | ICD-10-CM | POA: Diagnosis present

## 2023-03-27 DIAGNOSIS — Z79899 Other long term (current) drug therapy: Secondary | ICD-10-CM

## 2023-03-27 DIAGNOSIS — M25569 Pain in unspecified knee: Secondary | ICD-10-CM | POA: Diagnosis present

## 2023-03-27 DIAGNOSIS — I2609 Other pulmonary embolism with acute cor pulmonale: Secondary | ICD-10-CM | POA: Diagnosis present

## 2023-03-27 DIAGNOSIS — Z1152 Encounter for screening for COVID-19: Secondary | ICD-10-CM | POA: Diagnosis not present

## 2023-03-27 DIAGNOSIS — I10 Essential (primary) hypertension: Secondary | ICD-10-CM | POA: Diagnosis present

## 2023-03-27 DIAGNOSIS — L299 Pruritus, unspecified: Secondary | ICD-10-CM | POA: Diagnosis present

## 2023-03-27 DIAGNOSIS — Z880 Allergy status to penicillin: Secondary | ICD-10-CM

## 2023-03-27 DIAGNOSIS — I959 Hypotension, unspecified: Secondary | ICD-10-CM | POA: Diagnosis not present

## 2023-03-27 DIAGNOSIS — R579 Shock, unspecified: Secondary | ICD-10-CM | POA: Diagnosis not present

## 2023-03-27 DIAGNOSIS — I2781 Cor pulmonale (chronic): Secondary | ICD-10-CM | POA: Diagnosis not present

## 2023-03-27 DIAGNOSIS — Z9981 Dependence on supplemental oxygen: Secondary | ICD-10-CM

## 2023-03-27 LAB — COMPREHENSIVE METABOLIC PANEL
ALT: 15 U/L (ref 0–44)
AST: 19 U/L (ref 15–41)
Albumin: 3.6 g/dL (ref 3.5–5.0)
Alkaline Phosphatase: 226 U/L — ABNORMAL HIGH (ref 38–126)
Anion gap: 13 (ref 5–15)
BUN: 64 mg/dL — ABNORMAL HIGH (ref 8–23)
CO2: 26 mmol/L (ref 22–32)
Calcium: 8.6 mg/dL — ABNORMAL LOW (ref 8.9–10.3)
Chloride: 96 mmol/L — ABNORMAL LOW (ref 98–111)
Creatinine, Ser: 6.12 mg/dL — ABNORMAL HIGH (ref 0.44–1.00)
GFR, Estimated: 7 mL/min — ABNORMAL LOW (ref >60)
Glucose, Bld: 286 mg/dL — ABNORMAL HIGH (ref 70–99)
Potassium: 6.2 mmol/L — ABNORMAL HIGH (ref 3.5–5.1)
Sodium: 135 mmol/L (ref 135–145)
Total Bilirubin: 1 mg/dL (ref 0.0–1.2)
Total Protein: 8.1 g/dL (ref 6.5–8.1)

## 2023-03-27 LAB — I-STAT CG4 LACTIC ACID, ED
Lactic Acid, Venous: 2.3 mmol/L (ref 0.5–1.9)
Lactic Acid, Venous: 2.3 mmol/L (ref 0.5–1.9)

## 2023-03-27 LAB — CBC
HCT: 40.1 % (ref 36.0–46.0)
Hemoglobin: 11.1 g/dL — ABNORMAL LOW (ref 12.0–15.0)
MCH: 24.5 pg — ABNORMAL LOW (ref 26.0–34.0)
MCHC: 27.7 g/dL — ABNORMAL LOW (ref 30.0–36.0)
MCV: 88.5 fL (ref 80.0–100.0)
Platelets: 233 10*3/uL (ref 150–400)
RBC: 4.53 MIL/uL (ref 3.87–5.11)
RDW: 21.7 % — ABNORMAL HIGH (ref 11.5–15.5)
WBC: 9.1 10*3/uL (ref 4.0–10.5)
nRBC: 0.4 % — ABNORMAL HIGH (ref 0.0–0.2)

## 2023-03-27 LAB — CBC WITH DIFFERENTIAL/PLATELET
Abs Immature Granulocytes: 0.09 10*3/uL — ABNORMAL HIGH (ref 0.00–0.07)
Basophils Absolute: 0 10*3/uL (ref 0.0–0.1)
Basophils Relative: 0 %
Eosinophils Absolute: 0.3 10*3/uL (ref 0.0–0.5)
Eosinophils Relative: 3 %
HCT: 48.5 % — ABNORMAL HIGH (ref 36.0–46.0)
Hemoglobin: 13.2 g/dL (ref 12.0–15.0)
Immature Granulocytes: 1 %
Lymphocytes Relative: 15 %
Lymphs Abs: 1.5 10*3/uL (ref 0.7–4.0)
MCH: 23.7 pg — ABNORMAL LOW (ref 26.0–34.0)
MCHC: 27.2 g/dL — ABNORMAL LOW (ref 30.0–36.0)
MCV: 86.9 fL (ref 80.0–100.0)
Monocytes Absolute: 1.1 10*3/uL — ABNORMAL HIGH (ref 0.1–1.0)
Monocytes Relative: 11 %
Neutro Abs: 7.1 10*3/uL (ref 1.7–7.7)
Neutrophils Relative %: 70 %
Platelets: 253 10*3/uL (ref 150–400)
RBC: 5.58 MIL/uL — ABNORMAL HIGH (ref 3.87–5.11)
RDW: 22.4 % — ABNORMAL HIGH (ref 11.5–15.5)
WBC: 10.1 10*3/uL (ref 4.0–10.5)
nRBC: 0.3 % — ABNORMAL HIGH (ref 0.0–0.2)

## 2023-03-27 LAB — URINALYSIS, ROUTINE W REFLEX MICROSCOPIC
Bilirubin Urine: NEGATIVE
Glucose, UA: NEGATIVE mg/dL
Hgb urine dipstick: NEGATIVE
Ketones, ur: NEGATIVE mg/dL
Nitrite: NEGATIVE
Protein, ur: NEGATIVE mg/dL
Specific Gravity, Urine: 1.014 (ref 1.005–1.030)
pH: 5 (ref 5.0–8.0)

## 2023-03-27 LAB — RESP PANEL BY RT-PCR (RSV, FLU A&B, COVID)  RVPGX2
Influenza A by PCR: NEGATIVE
Influenza B by PCR: NEGATIVE
Resp Syncytial Virus by PCR: NEGATIVE
SARS Coronavirus 2 by RT PCR: NEGATIVE

## 2023-03-27 LAB — CBG MONITORING, ED
Glucose-Capillary: 214 mg/dL — ABNORMAL HIGH (ref 70–99)
Glucose-Capillary: 177 mg/dL — ABNORMAL HIGH (ref 70–99)

## 2023-03-27 LAB — BLOOD GAS, ARTERIAL
Acid-base deficit: 1.6 mmol/L (ref 0.0–2.0)
Bicarbonate: 28.3 mmol/L — ABNORMAL HIGH (ref 20.0–28.0)
Delivery systems: POSITIVE
Drawn by: 51133
Expiratory PAP: 6 cm[H2O]
FIO2: 40 %
Inspiratory PAP: 20 cm[H2O]
O2 Saturation: 94.8 %
Patient temperature: 36.6
RATE: 15 {breaths}/min
pCO2 arterial: 73 mm[Hg] (ref 32–48)
pH, Arterial: 7.2 — ABNORMAL LOW (ref 7.35–7.45)
pO2, Arterial: 68 mm[Hg] — ABNORMAL LOW (ref 83–108)

## 2023-03-27 LAB — BRAIN NATRIURETIC PEPTIDE: B Natriuretic Peptide: 462.5 pg/mL — ABNORMAL HIGH (ref 0.0–100.0)

## 2023-03-27 LAB — RAPID URINE DRUG SCREEN, HOSP PERFORMED
Amphetamines: NOT DETECTED
Barbiturates: NOT DETECTED
Benzodiazepines: NOT DETECTED
Cocaine: NOT DETECTED
Opiates: POSITIVE — AB
Tetrahydrocannabinol: NOT DETECTED

## 2023-03-27 LAB — BETA-HYDROXYBUTYRIC ACID: Beta-Hydroxybutyric Acid: 0.24 mmol/L (ref 0.05–0.27)

## 2023-03-27 LAB — CREATININE, SERUM
Creatinine, Ser: 5.21 mg/dL — ABNORMAL HIGH (ref 0.44–1.00)
GFR, Estimated: 8 mL/min — ABNORMAL LOW (ref >60)

## 2023-03-27 LAB — BLOOD GAS, VENOUS
Acid-Base Excess: 0.1 mmol/L (ref 0.0–2.0)
Bicarbonate: 30.6 mmol/L — ABNORMAL HIGH (ref 20.0–28.0)
O2 Saturation: 78.2 %
Patient temperature: 37
pCO2, Ven: 80 mm[Hg] (ref 44–60)
pH, Ven: 7.19 — CL (ref 7.25–7.43)
pO2, Ven: 45 mm[Hg] (ref 32–45)

## 2023-03-27 LAB — TROPONIN I (HIGH SENSITIVITY)
Troponin I (High Sensitivity): 120 ng/L (ref <18)
Troponin I (High Sensitivity): 105 ng/L (ref <18)
Troponin I (High Sensitivity): 91 ng/L — ABNORMAL HIGH (ref <18)

## 2023-03-27 LAB — D-DIMER, QUANTITATIVE: D-Dimer, Quant: 1.05 ug{FEU}/mL — ABNORMAL HIGH (ref 0.00–0.50)

## 2023-03-27 MED ORDER — POLYETHYLENE GLYCOL 3350 17 G PO PACK: 17.0000 g | PACK | Freq: Every day | ORAL | Status: DC | PRN

## 2023-03-27 MED ORDER — INSULIN ASPART 100 UNIT/ML IJ SOLN
0.0000 [IU] | INTRAMUSCULAR | Status: DC
  Administered 2023-03-27 – 2023-03-28 (×2): 4 [IU] via SUBCUTANEOUS
  Administered 2023-03-28: 3 [IU] via SUBCUTANEOUS
  Administered 2023-03-28: 7 [IU] via SUBCUTANEOUS
  Filled 2023-03-27: qty 0.2

## 2023-03-27 MED ORDER — LIDOCAINE 5 % EX PTCH
1.0000 | MEDICATED_PATCH | CUTANEOUS | Status: DC
  Administered 2023-03-27 – 2023-03-28 (×2): 1 via TRANSDERMAL
  Filled 2023-03-27 (×3): qty 1

## 2023-03-27 MED ORDER — NOREPINEPHRINE 4 MG/250ML-% IV SOLN
0.0000 ug/min | INTRAVENOUS | Status: DC
  Administered 2023-03-27: 2 ug/min via INTRAVENOUS
  Filled 2023-03-27: qty 250

## 2023-03-27 MED ORDER — MIDODRINE HCL 5 MG PO TABS
10.0000 mg | ORAL_TABLET | Freq: Three times a day (TID) | ORAL | Status: DC
  Administered 2023-03-28 – 2023-04-02 (×4): 10 mg via ORAL
  Filled 2023-03-27 (×5): qty 2

## 2023-03-27 MED ORDER — PHENYLEPHRINE 80 MCG/ML (10ML) SYRINGE FOR IV PUSH (FOR BLOOD PRESSURE SUPPORT)
PREFILLED_SYRINGE | INTRAVENOUS | Status: AC
  Filled 2023-03-27: qty 10

## 2023-03-27 MED ORDER — LINEZOLID 600 MG/300ML IV SOLN
600.0000 mg | Freq: Two times a day (BID) | INTRAVENOUS | Status: DC
  Administered 2023-03-27 – 2023-03-29 (×5): 600 mg via INTRAVENOUS
  Filled 2023-03-27 (×6): qty 300

## 2023-03-27 MED ORDER — LACTATED RINGERS IV BOLUS
1000.0000 mL | Freq: Once | INTRAVENOUS | Status: AC
Start: 2023-03-27 — End: 2023-03-27
  Administered 2023-03-27: 1000 mL via INTRAVENOUS

## 2023-03-27 MED ORDER — SODIUM CHLORIDE 0.9 % IV BOLUS
1000.0000 mL | INTRAVENOUS | Status: AC
  Administered 2023-03-27: 1000 mL via INTRAVENOUS

## 2023-03-27 MED ORDER — IOHEXOL 300 MG/ML  SOLN: 80.0000 mL | Freq: Once | INTRAMUSCULAR | Status: DC | PRN

## 2023-03-27 MED ORDER — PREGABALIN 50 MG PO CAPS
50.0000 mg | ORAL_CAPSULE | Freq: Once | ORAL | Status: AC
  Administered 2023-03-27: 50 mg via ORAL
  Filled 2023-03-27: qty 1

## 2023-03-27 MED ORDER — SODIUM CHLORIDE 0.9 % IV SOLN
2.0000 g | INTRAVENOUS | Status: DC
  Administered 2023-03-28: 2 g via INTRAVENOUS
  Filled 2023-03-27: qty 12.5

## 2023-03-27 MED ORDER — SODIUM ZIRCONIUM CYCLOSILICATE 10 G PO PACK
10.0000 g | PACK | Freq: Once | ORAL | Status: DC
  Administered 2023-03-30: 10 g via ORAL
  Filled 2023-03-27: qty 1

## 2023-03-27 MED ORDER — HEPARIN SODIUM (PORCINE) 5000 UNIT/ML IJ SOLN
5000.0000 [IU] | Freq: Three times a day (TID) | INTRAMUSCULAR | Status: DC
  Administered 2023-03-27 – 2023-03-28 (×2): 5000 [IU] via SUBCUTANEOUS
  Filled 2023-03-27 (×3): qty 1

## 2023-03-27 MED ORDER — INSULIN ASPART 100 UNIT/ML IV SOLN
5.0000 [IU] | Freq: Once | INTRAVENOUS | Status: AC
  Administered 2023-03-27: 5 [IU] via INTRAVENOUS
  Filled 2023-03-27: qty 0.05

## 2023-03-27 MED ORDER — SODIUM CHLORIDE 0.9 % IV SOLN
2.0000 g | INTRAVENOUS | Status: AC
  Administered 2023-03-27: 2 g via INTRAVENOUS
  Filled 2023-03-27: qty 12.5

## 2023-03-27 MED ORDER — KETOROLAC TROMETHAMINE 15 MG/ML IJ SOLN
15.0000 mg | Freq: Once | INTRAMUSCULAR | Status: AC
  Administered 2023-03-27: 15 mg via INTRAVENOUS
  Filled 2023-03-27: qty 1

## 2023-03-27 MED ORDER — SODIUM CHLORIDE 0.9 % IV BOLUS: 10000.0000 mL | INTRAVENOUS | Status: DC

## 2023-03-27 MED ORDER — DOCUSATE SODIUM 100 MG PO CAPS: 100.0000 mg | ORAL_CAPSULE | Freq: Two times a day (BID) | ORAL | Status: DC | PRN

## 2023-03-27 MED ORDER — NOREPINEPHRINE 4 MG/250ML-% IV SOLN
2.0000 ug/min | INTRAVENOUS | Status: DC
  Administered 2023-03-28: 2 ug/min via INTRAVENOUS

## 2023-03-27 MED ORDER — SODIUM CHLORIDE 0.9 % IV SOLN
250.0000 mL | INTRAVENOUS | Status: AC
  Administered 2023-03-28: 250 mL via INTRAVENOUS

## 2023-03-27 MED ORDER — ACETAMINOPHEN 325 MG PO TABS: 650.0000 mg | ORAL_TABLET | ORAL | Status: DC | PRN

## 2023-03-27 NOTE — Progress Notes (Signed)
   03/27/23 1938  BiPAP/CPAP/SIPAP  BiPAP/CPAP/SIPAP Pt Type Adult  BiPAP/CPAP/SIPAP V60  Mask Type Full face mask  Mask Size Medium  Set Rate 15 breaths/min  Respiratory Rate 15 breaths/min  IPAP 20 cmH20  EPAP 6 cmH2O  FiO2 (%) 40 %  Minute Ventilation 11.5  Leak 9  Peak Inspiratory Pressure (PIP) 20  Tidal Volume (Vt) 746  Patient Home Equipment No  Auto Titrate No  Press High Alarm 35 cmH2O  Press Low Alarm 5 cmH2O  BiPAP/CPAP /SiPAP Vitals  Resp 18  MEWS Score/Color  MEWS Score 1  MEWS Score Color Chilton Si
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
## 2023-03-27 NOTE — Progress Notes (Signed)
Pharmacy Antibiotic Note  [name] [name] is a 68 y.o. female admitted on 03/27/2023 with sepsis.  Pharmacy has been consulted for cefepime dosing.  Today, 03/27/23 AKI: SCr 6.12 significantly elevated from baseline of ~2.5 No weight charted. Will use most recent total body weight of 124 kg from 02/22/23, and adjusted weight of 80 kg to calculate CrCl 11 mL/min  Plan: Cefepime 2 g IV q24h Linezolid 600 mg IV q12h per MD Follow renal function, culture data     Temp (24hrs), Avg:98 F (36.7 C), Min:97.9 F (36.6 C), Max:98.1 F (36.7 C)  Recent Labs  Lab 03/27/23 1622 03/27/23 1754 03/27/23 1917  WBC 10.1  --   --   CREATININE 6.12*  --   --   LATICACIDVEN  --  2.3* 2.3*    CrCl cannot be calculated (Unknown ideal weight.).    Allergies  Allergen Reactions   Lisinopril Swelling   Morphine Itching and Rash   Penicillins Hives, Itching, Nausea And Vomiting and Rash   Metformin Other (See Comments)   Sulfa Antibiotics Other (See Comments)    Antimicrobials this admission: cefepime 2/3 >>  linezolid 2/3 >>   Dose adjustments this admission:  Microbiology results: 2/3 BCx:   Cindi Carbon, PharmD 03/27/2023 8:07 PM

## 2023-03-27 NOTE — Progress Notes (Signed)
Patient transported on BiPAP to CT and back to room 1 in ED. Vitals stable.

## 2023-03-27 NOTE — Progress Notes (Signed)
ABG sent to lab, lab made aware. 

## 2023-03-27 NOTE — Progress Notes (Signed)
Pt transported from ED to 1232. ICU RT made aware.

## 2023-03-27 NOTE — ED Triage Notes (Signed)
Pt arrives via EMS co AMS, family called after finding pt confused in bed. Family called 911 yesterday as well for similar symptoms that reportedly resolved. SOB for EMS, placed on NRB with improvement in mentation, continued at this time. BG 359

## 2023-03-27 NOTE — Progress Notes (Incomplete)
eLink Physician-Brief Progress Note Patient Name: [name] [name] DOB: 07-29-1955 MRN: [id number]   Date of Service  03/27/2023  HPI/Events of Note  68 year old with a history of morbid obesity, OHS, metabolic syndrome found to be hypoxic and brought into the emergency department where she was placed on BiPAP and referred to ICU for further management.  She was hypotensive requiring low-dose norepinephrine.  Results consistent with acute on chronic hypercapnia with acidemia, elevated creatinine, mild normocytic anemia.  Chest radiograph unremarkable.   eICU Interventions  Maintain empiric antibiotics  Transiently on norepinephrine, takes home midodrine which has been resumed.  Maintain BiPAP therapy (16-18L vT)   Replace home cream with hydrocortisone for pruritus.  One-time Benadryl IV.  DVT prophylaxis with heparin GI prophylaxis not indicated   0650 -urine output tonight.  Positive fluid balance.  Hemodynamically unchanged.  Continue observation.  Intervention Category Evaluation Type: New Patient Evaluation  [name] [name] 03/27/2023, 8:20 PM

## 2023-03-27 NOTE — ED Provider Notes (Signed)
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   Sepsis   sodium chloride 0.9 % bolus 1,000 mL   midodrine (PROAMATINE) tablet 10 mg    -I have reviewed the patients home medicines and have made adjustments as needed  Critical interventions BiPAP, fluid resuscitation, peripheral Levophed  Consultations Obtained: I requested consultation with the intensivist,  and discussed lab and imaging findings as well as pertinent plan - they recommend: ICU admission   Cardiac Monitoring: The  patient was maintained on a cardiac monitor.  I personally viewed and interpreted the cardiac monitored which showed an underlying rhythm of: NSR  Social Determinants of Health:  Factors impacting patients care include: none   Reevaluation: After the interventions noted above, I reevaluated the patient and found that they have :improved  Co morbidities that complicate the patient evaluation  Past Medical History:  Diagnosis Date   Diabetes mellitus without complication (HCC)    Hypertension    Knee pain, chronic    Neuropathy in diabetes (HCC)       Dispostion: I considered admission for this patient, and patient require hospital mission for severe AKI and respiratory failure requiring BiPAP     Final Clinical Impression(s) / ED Diagnoses Final diagnoses:  Renal failure, unspecified chronicity  Obesity hypoventilation syndrome (HCC)  Acute respiratory failure with hypercapnia (HCC)     @PCDICTATION @    Glendora Score, MD 03/27/23 2244

## 2023-03-28 ENCOUNTER — Other Ambulatory Visit: Payer: Self-pay

## 2023-03-28 ENCOUNTER — Inpatient Hospital Stay (HOSPITAL_COMMUNITY): Payer: No Typology Code available for payment source

## 2023-03-28 DIAGNOSIS — I2781 Cor pulmonale (chronic): Secondary | ICD-10-CM

## 2023-03-28 DIAGNOSIS — R579 Shock, unspecified: Secondary | ICD-10-CM | POA: Diagnosis not present

## 2023-03-28 DIAGNOSIS — R578 Other shock: Secondary | ICD-10-CM | POA: Diagnosis not present

## 2023-03-28 DIAGNOSIS — M7989 Other specified soft tissue disorders: Secondary | ICD-10-CM

## 2023-03-28 DIAGNOSIS — J9621 Acute and chronic respiratory failure with hypoxia: Secondary | ICD-10-CM | POA: Diagnosis not present

## 2023-03-28 DIAGNOSIS — J9622 Acute and chronic respiratory failure with hypercapnia: Secondary | ICD-10-CM | POA: Diagnosis not present

## 2023-03-28 LAB — CBC
HCT: 43.9 % (ref 36.0–46.0)
Hemoglobin: 12.2 g/dL (ref 12.0–15.0)
MCH: 24.1 pg — ABNORMAL LOW (ref 26.0–34.0)
MCHC: 27.8 g/dL — ABNORMAL LOW (ref 30.0–36.0)
MCV: 86.6 fL (ref 80.0–100.0)
Platelets: 251 10*3/uL (ref 150–400)
RBC: 5.07 MIL/uL (ref 3.87–5.11)
RDW: 21.7 % — ABNORMAL HIGH (ref 11.5–15.5)
WBC: 7.3 10*3/uL (ref 4.0–10.5)
nRBC: 0.5 % — ABNORMAL HIGH (ref 0.0–0.2)

## 2023-03-28 LAB — GLUCOSE, CAPILLARY
Glucose-Capillary: 142 mg/dL — ABNORMAL HIGH (ref 70–99)
Glucose-Capillary: 169 mg/dL — ABNORMAL HIGH (ref 70–99)
Glucose-Capillary: 177 mg/dL — ABNORMAL HIGH (ref 70–99)
Glucose-Capillary: 188 mg/dL — ABNORMAL HIGH (ref 70–99)
Glucose-Capillary: 239 mg/dL — ABNORMAL HIGH (ref 70–99)

## 2023-03-28 LAB — APTT: aPTT: 26 s (ref 24–36)

## 2023-03-28 LAB — PROTIME-INR
INR: 1.1 (ref 0.8–1.2)
Prothrombin Time: 14.4 s (ref 11.4–15.2)

## 2023-03-28 LAB — ECHOCARDIOGRAM COMPLETE: S' Lateral: 2.5 cm

## 2023-03-28 LAB — SARS CORONAVIRUS 2 BY RT PCR: SARS Coronavirus 2 by RT PCR: NEGATIVE

## 2023-03-28 LAB — MRSA NEXT GEN BY PCR, NASAL: MRSA by PCR Next Gen: DETECTED — AB

## 2023-03-28 LAB — CREATININE, SERUM
Creatinine, Ser: 6.15 mg/dL — ABNORMAL HIGH (ref 0.44–1.00)
GFR, Estimated: 7 mL/min — ABNORMAL LOW (ref >60)

## 2023-03-28 LAB — TROPONIN I (HIGH SENSITIVITY): Troponin I (High Sensitivity): 95 ng/L — ABNORMAL HIGH (ref <18)

## 2023-03-28 LAB — HEPARIN LEVEL (UNFRACTIONATED): Heparin Unfractionated: 0.7 [IU]/mL (ref 0.30–0.70)

## 2023-03-28 MED ORDER — HYDROCORTISONE 1 % EX CREA
TOPICAL_CREAM | Freq: Two times a day (BID) | CUTANEOUS | Status: AC
  Filled 2023-03-28: qty 28

## 2023-03-28 MED ORDER — ORAL CARE MOUTH RINSE
15.0000 mL | OROMUCOSAL | Status: DC
  Administered 2023-03-29 (×4): 15 mL via OROMUCOSAL

## 2023-03-28 MED ORDER — HEPARIN (PORCINE) 25000 UT/250ML-% IV SOLN
1000.0000 [IU]/h | INTRAVENOUS | Status: DC
  Administered 2023-03-28: 1100 [IU]/h via INTRAVENOUS
  Administered 2023-03-29: 800 [IU]/h via INTRAVENOUS
  Filled 2023-03-28 (×2): qty 250

## 2023-03-28 MED ORDER — NYSTATIN 100000 UNIT/GM EX POWD
Freq: Four times a day (QID) | CUTANEOUS | Status: DC | PRN
Start: 2023-03-28 — End: 2023-04-06
  Filled 2023-03-28: qty 15

## 2023-03-28 MED ORDER — ORAL CARE MOUTH RINSE: 15.0000 mL | OROMUCOSAL | Status: DC | PRN

## 2023-03-28 MED ORDER — INSULIN ASPART 100 UNIT/ML IJ SOLN
3.0000 [IU] | Freq: Three times a day (TID) | INTRAMUSCULAR | Status: DC
  Administered 2023-03-28 (×2): 6 [IU] via SUBCUTANEOUS
  Administered 2023-03-29: 3 [IU] via SUBCUTANEOUS
  Administered 2023-03-29: 6 [IU] via SUBCUTANEOUS
  Administered 2023-03-30: 3 [IU] via SUBCUTANEOUS

## 2023-03-28 MED ORDER — DIPHENHYDRAMINE HCL 50 MG/ML IJ SOLN
12.5000 mg | Freq: Once | INTRAMUSCULAR | Status: AC
  Administered 2023-03-28: 12.5 mg via INTRAVENOUS
  Filled 2023-03-28: qty 1

## 2023-03-28 MED ORDER — LIDOCAINE 5 % EX PTCH
1.0000 | MEDICATED_PATCH | CUTANEOUS | Status: DC
Start: 2023-03-28 — End: 2023-04-06
  Administered 2023-03-29 – 2023-04-05 (×9): 1 via TRANSDERMAL
  Filled 2023-03-28 (×8): qty 1

## 2023-03-28 MED ORDER — MUPIROCIN 2 % EX OINT
1.0000 | TOPICAL_OINTMENT | Freq: Two times a day (BID) | CUTANEOUS | Status: AC
  Administered 2023-03-28 – 2023-04-01 (×10): 1 via NASAL
  Filled 2023-03-28 (×4): qty 22

## 2023-03-28 MED ORDER — PREGABALIN 25 MG PO CAPS
25.0000 mg | ORAL_CAPSULE | Freq: Two times a day (BID) | ORAL | Status: DC
  Administered 2023-03-28 – 2023-03-29 (×3): 25 mg via ORAL
  Filled 2023-03-28 (×5): qty 1

## 2023-03-28 MED ORDER — CHLORHEXIDINE GLUCONATE CLOTH 2 % EX PADS
6.0000 | MEDICATED_PAD | Freq: Every day | CUTANEOUS | Status: DC
  Administered 2023-03-28 – 2023-04-05 (×9): 6 via TOPICAL

## 2023-03-28 MED ORDER — PREGABALIN 25 MG PO CAPS
50.0000 mg | ORAL_CAPSULE | Freq: Once | ORAL | Status: AC
  Administered 2023-03-29: 50 mg via ORAL
  Filled 2023-03-28: qty 2

## 2023-03-28 NOTE — Plan of Care (Signed)
  Problem: Education: Goal: Ability to describe self-care measures that may prevent or decrease complications (Diabetes Survival Skills Education) will improve Outcome: Progressing   Problem: Coping: Goal: Ability to adjust to condition or change in health will improve Outcome: Progressing   Problem: Health Behavior/Discharge Planning: Goal: Ability to identify and utilize available resources and services will improve Outcome: Progressing   Problem: Nutritional: Goal: Maintenance of adequate nutrition will improve Outcome: Progressing   Problem: Tissue Perfusion: Goal: Adequacy of tissue perfusion will improve Outcome: Progressing   Problem: Education: Goal: Knowledge of General Education information will improve Description: Including pain rating scale, medication(s)/side effects and non-pharmacologic comfort measures Outcome: Progressing

## 2023-03-28 NOTE — Plan of Care (Signed)
 Plan of care and goals reviewed with patient, time given for questions and concerned's, patient handbook/guide at bedside, patient on 5 liters of oxygen  at this time saturations are 91 -92% patient to wear BiPAP at bed time.  Problem: Education: Goal: Ability to describe self-care measures that may prevent or decrease complications (Diabetes Survival Skills Education) will improve Outcome: Progressing Goal: Individualized Educational Video(s) Outcome: Progressing   Problem: Coping: Goal: Ability to adjust to condition or change in health will improve Outcome: Progressing   Problem: Fluid Volume: Goal: Ability to maintain a balanced intake and output will improve Outcome: Progressing   Problem: Health Behavior/Discharge Planning: Goal: Ability to identify and utilize available resources and services will improve Outcome: Progressing Goal: Ability to manage health-related needs will improve Outcome: Progressing   Problem: Metabolic: Goal: Ability to maintain appropriate glucose levels will improve Outcome: Progressing   Problem: Nutritional: Goal: Maintenance of adequate nutrition will improve Outcome: Progressing Goal: Progress toward achieving an optimal weight will improve Outcome: Progressing   Problem: Skin Integrity: Goal: Risk for impaired skin integrity will decrease Outcome: Progressing   Problem: Tissue Perfusion: Goal: Adequacy of tissue perfusion will improve Outcome: Progressing   Problem: Education: Goal: Knowledge of General Education information will improve Description: Including pain rating scale, medication(s)/side effects and non-pharmacologic comfort measures Outcome: Progressing   Problem: Health Behavior/Discharge Planning: Goal: Ability to manage health-related needs will improve Outcome: Progressing   Problem: Clinical Measurements: Goal: Ability to maintain clinical measurements within normal limits will improve Outcome: Progressing Goal:  Will remain free from infection Outcome: Progressing Goal: Diagnostic test results will improve Outcome: Progressing Goal: Respiratory complications will improve Outcome: Progressing Goal: Cardiovascular complication will be avoided Outcome: Progressing   Problem: Activity: Goal: Risk for activity intolerance will decrease Outcome: Progressing   Problem: Nutrition: Goal: Adequate nutrition will be maintained Outcome: Progressing   Problem: Coping: Goal: Level of anxiety will decrease Outcome: Progressing   Problem: Elimination: Goal: Will not experience complications related to bowel motility Outcome: Progressing Goal: Will not experience complications related to urinary retention Outcome: Progressing   Problem: Pain Managment: Goal: General experience of comfort will improve and/or be controlled Outcome: Progressing   Problem: Safety: Goal: Ability to remain free from injury will improve Outcome: Progressing   Problem: Skin Integrity: Goal: Risk for impaired skin integrity will decrease Outcome: Progressing

## 2023-03-28 NOTE — Progress Notes (Addendum)
 PHARMACY - ANTICOAGULATION CONSULT NOTE  Pharmacy Consult for IV Heparin  Indication: pulmonary embolus  Allergies  Allergen Reactions   Lisinopril Swelling   Morphine  Itching and Rash   Penicillins Hives, Itching, Nausea And Vomiting and Rash   Metformin Other (See Comments)   Sulfa Antibiotics Other (See Comments)    Patient Measurements: Height: 5' 2.5 (158.8 cm) Weight: 122.7 kg (270 lb 8.1 oz) IBW/kg (Calculated) : 51.25 Heparin  Dosing Weight: 81.7 kg  Vital Signs: Temp: 97.8 F (36.6 C) (02/04 0800) Temp Source: Axillary (02/04 0800) BP: 145/67 (02/04 1000) Pulse Rate: 97 (02/04 1000)  Labs: Recent Labs    03/27/23 1622 03/27/23 1910 03/27/23 2215 03/28/23 0314  HGB 13.2  --  11.1*  --   HCT 48.5*  --  40.1  --   PLT 253  --  233  --   CREATININE 6.12*  --  5.21* 6.15*  TROPONINIHS 120* 105* 91* 95*    Estimated Creatinine Clearance: 10.9 mL/min (A) (by C-G formula based on SCr of 6.15 mg/dL (H)).   Medical History: Past Medical History:  Diagnosis Date   Diabetes mellitus without complication (HCC)    Hypertension    Knee pain, chronic    Neuropathy in diabetes (HCC)     Medications:  No prior to admission anticoagulant meds listed On heparin  5000 units subcutaneous q8h for DVT prophylaxis, last dose 2/4 0739  Assessment: Pharmacy consulted to dose IV heparin  for this 68 yo female with a history of OHS, DM, HTN who presented by EMS after family found her with lower saturations after removing her bipap machine.   2/3 D-Dimer elevated at 1.05 2/4 VAS US  Lower Extremity Venous: No evidence of DVT in lower extremity 2/4 ECHO: McConnell's sign is present consistent with acute cor pulmonale, such as acute pulmonary embolism or other episode of acute increase in pulmonary vascular resistance.    Goal of Therapy:  Heparin  level 0.3-0.7 units/ml Monitor platelets by anticoagulation protocol: Yes   Plan:  Discontinue subcutaneous heparin  Initiate IV  heparin  continuous infusion at 1100 units/hr Obtain 8 hour heparin  level for titration Monitor daily heparin  level, CBC, signs/symptoms of bleeding    Thank you for allowing pharmacy to be a part of this patient's care.  Eleanor EMERSON Agent, PharmD, BCPS Clinical Pharmacist [place] 03/28/2023 10:22 AM

## 2023-03-28 NOTE — Progress Notes (Signed)
 NAME:  [name] [name], MRN:  [id number], DOB:  May 23, 1955, LOS: 1 ADMISSION DATE:  03/27/2023, CONSULTATION DATE:  2/3 REFERRING MD:  [facility]-EDP, CHIEF COMPLAINT:  respiratory failure   History of Present Illness:  [name] [name] is a 68 y/o woman with a history of OHS, DM, HTN who presented by EMS after family found her with lower saturations after removing her bipap machine. She was saturating 73% when she was found by her aid. She was brought to the ED for confusion and has been on bipap. Per her sister [name] [name], she has been pulling off her bipap while she is asleep- they have found her without it a few times. Her sister has been encouraging her to wear it and leave it on overnight. She complaining of worse neuropathic pain yesterday than her baseline. Per her sister, she called EMS about this but refused hospital transport last night.  She has not had sick contacts.  Her sister is concerned that she has had poor p.o. intake recently and has not been doing that she needs to do at home despite encouragement.  She was on gabapentin  until her admission in December.  During that admission she had acute kidney injury with renal failure that improved with diuresis.  In the emergency department she was found to have respiratory acidosis and started on BiPAP.  Started on norepinephrine  just before my exam due to hypotension.  Pertinent  Medical History  OHS Chronic respiratory failure on 4L O2, nocturnal BiPAP Morbid obesity CKD HTN DM Peripheral neuropathy  Significant Hospital Events: Including procedures, antibiotic start and stop dates in addition to other pertinent events   2/3 admission- bipap, NE, empiric antibiotics  Interim History / Subjective:  This morning alert to self and year but not to place or situation. Pain in LUE. No IV access.   Objective   Blood pressure (!) 145/67, pulse 97, temperature 97.8 F (36.6 C), temperature source Axillary, resp. rate 18, height 5' 2.5 (1.588 m),  weight 122.7 kg, SpO2 94%.    FiO2 (%):  [40 %] 40 %   Intake/Output Summary (Last 24 hours) at 03/28/2023 1153 Last data filed at 03/28/2023 0800 Gross per 24 hour  Intake 402.18 ml  Output 0 ml  Net 402.18 ml   Filed Weights   03/28/23 0000 03/28/23 0500  Weight: 122.7 kg 122.7 kg    Examination: Chronically ill appearing on bipap Laying flat Morbidly obese LUE swollen and tender to palpation, distal pulses palpable, extremities warm Abdomen distended, soft No pitting Lower extremity edema Breath and heart sounds diminished, no wheeze RRR  Cr 6.15 Troponin 95 Na 135 K 6.2 Glucose 286 AP 226   Chest xray 2/3 reviewed - baseline cardiomegaly, low lung volumes, no acute process  Resolved Hospital Problem list     Assessment & Plan:  Acute on chronic respiratory failure with hypoxia and hypercapnia due to likely BiPAP noncompliance Baseline OSA and OHS - Empiric antibiotics due to lactic acidosis that is not resolving with correction of hypoxia - will send procalcitonin, consider de-escalation of abx in the next 24 hours - Serial ABGs - Continue BiPAP  Acute on chronic cor pulmonale High probability pulmonary embolism, submassive criteria Troponin elevation - discussed with cardiology this morning, [name] but this has acutely worsened since last scan. High concern clinically for pulmonary embolism - since she is not able to get CTPE study due to elevated Cr, will empirically start heparin  gtt until diagnostics can be performed -  would not give any thrombolytic therapy without confirming diagnosis. Her age and functional status preclude aggressive procedures. Would consider systemic thrombolytics if positive for PE.  Troponin elevated, BNP elevated  LUE Swelling - venous doppler to r/o dvt  Circulatory shock - doubt sepsis, possibly secondary to RV failure as above -Resume PTA midodrine  - Empiric norepinephrine  goal MAP>65 -Sepsis  fluids -Draw blood cultures, start empiric antibiotics.  COVID testing. -Serial troponins, echo  Acute encephalopathy suspect metabolic from hypercapnia, but septic is possible.  Review of PTA med list not strongly suggestive of any meds that could cause this other than Lyrica  -Hold PTA Lyrica , continue bipap as above  Diabetes with uncontrolled hyperglycemia; A1c 9.2 in 12/1022 - Resistant sliding scale every 4 until eating -goal BG 140-180  Hyperkalemia AKI on CKD 4 - initially received 2LS, closely monitor UOP - recheck bmet.   Best Practice (right click and Reselect all SmartList Selections daily)   Diet/type: NPO w/ oral meds DVT prophylaxis prophylactic heparin   Pressure ulcer(s): pressure ulcer assessment deferred  unsure- need help rolling her once in icu GI prophylaxis: N/A Lines: N/A Foley:  Yes, and it is still needed Code Status:  full code Last date of multidisciplinary goals of care discussion [2/3 with sister [name] [name] via phone]  Critical care time:     The patient is critically ill due to shock, respiratory failure, encephalopathy.  Critical care was necessary to treat or prevent imminent or life-threatening deterioration.  Critical care was time spent personally by me on the following activities: development of treatment plan with patient and/or surrogate as well as nursing, discussions with consultants, evaluation of patient's response to treatment, examination of patient, obtaining history from patient or surrogate, ordering and performing treatments and interventions, ordering and review of laboratory studies, ordering and review of radiographic studies, pulse oximetry, re-evaluation of patient's condition and participation in multidisciplinary rounds.   Critical Care Time devoted to patient care services described in this note is 45 minutes. This time reflects time of care of this signee [name] [name] . This critical care time does not reflect separately billable  procedures or procedure time, teaching time or supervisory time of PA/NP/Med student/Med Resident etc but could involve care discussion time.       [name] [name] 03/28/2023 11:54 AM  Pager: see AMION  If no response to pager , please call critical care on call (see AMION) until 7pm After 7:00 pm call Elink

## 2023-03-28 NOTE — Progress Notes (Signed)
  Upper extremity ultrasound negative for DVT - more likely consistent with IV infiltration event.  Patient more awake throughout the course of the day, tolerated off bipap, c/o pain in feet which is burning, c/w known neuropathy. Will resume lyrica  at reduced dose given renal failure. Ok for diet. Suspect she is approaching closer to her baseline. Need to closely monitor uop and make definitive plan for her anticoagulation tomorrow.   Additional time 35 minutes.   Verdon Gore, MD Pulmonary and Critical Care Medicine [facility] 03/28/2023 5:06 PM Pager: see AMION  If no response to pager, please call critical care on call (see AMION) until 7pm After 7:00 pm call Elink

## 2023-03-28 NOTE — TOC Initial Note (Signed)
 Transition of Care [facility]) - Initial/Assessment Note   Patient Details  Name: [name] [name] MRN: [id number] Date of Birth: December 27, 1955  Transition of Care [facility]) CM/SW Contact:    Duwaine GORMAN Aran, LCSW Phone Number: 03/28/2023, 11:32 AM  Clinical Narrative: CSW notified by Kiki with [facility] that patient is active with the agency for PT/OT/RN. Patient will need new HH orders at discharge.  Expected Discharge Plan: Home w Home Health Services Barriers to Discharge: Continued Medical Work up  Expected Discharge Plan and Services In-house Referral: Clinical Social Work Living arrangements for the past 2 months: Apartment HH Agency: Other - See comment Product/process Development Scientist HH) Date HH Agency Contacted: 03/28/23 Time HH Agency Contacted: 1001 Representative spoke with at [facility] Agency: Kiki  Prior Living Arrangements/Services Living arrangements for the past 2 months: Apartment Patient language and need for interpreter reviewed:: Yes Do you feel safe going back to the place where you live?: Yes      Need for Family Participation in Patient Care: Yes (Comment) Care giver support system in place?: Yes (comment) Current home services: DME, Home OT, Home PT, Home RN (Patient is active with [name].) Criminal Activity/Legal Involvement Pertinent to Current Situation/Hospitalization: No - Comment as needed  Emotional Assessment Orientation: : Oriented to Self, Oriented to Place, Oriented to  Time Alcohol  / Substance Use: Not Applicable Psych Involvement: No (comment)  Admission diagnosis:  Obesity hypoventilation syndrome (HCC) [E66.2] Acute respiratory failure with hypercapnia (HCC) [J96.02] Acute on chronic respiratory failure with hypoxia and hypercapnia (HCC) [G03.78, J96.22] Renal failure, unspecified chronicity [N19] Patient Active Problem List   Diagnosis Date Noted   Acute on chronic respiratory failure with hypoxia and hypercapnia (HCC) 03/27/2023   Acute hypoxic respiratory  failure (HCC) 02/16/2023   Acute on chronic hypoxic respiratory failure (HCC) 01/14/2023   Acute on chronic heart failure with preserved ejection fraction (HFpEF) (HCC) 01/13/2023   Chronic respiratory failure with hypoxia (HCC) 01/13/2023   Chronic kidney disease, stage 3a (HCC) 10/02/2022   Chronic kidney disease, stage 3b (HCC) 10/02/2022   Hyperglycemia due to type 2 diabetes mellitus (HCC) 09/26/2021   Type 2 diabetes mellitus with hyperglycemia, with long-term current use of insulin  (HCC) 09/25/2021   (HFpEF) heart failure with preserved ejection fraction (HCC) 09/25/2021   Hypomagnesemia 09/08/2021   Severe sepsis (HCC) 09/07/2021   Bacteremia due to Klebsiella pneumoniae 09/07/2021   Urinary tract infection 09/07/2021   Acute respiratory failure with hypoxia and hypercapnia (HCC) 09/04/2021   DKA (diabetic ketoacidosis) (HCC) 02/02/2021   Morbid obesity with BMI of 50.0-59.9, adult (HCC) 02/02/2021   Hyperglycemia 02/01/2021   Pancreatitis 09/07/2020   Intractable nausea and vomiting 09/07/2020   Functional quadriplegia (HCC) 09/07/2020   Physical debility 08/26/2020   OSA (obstructive sleep apnea) 08/26/2020   Constipation    AKI (acute kidney injury) (HCC) 08/25/2020   Acute renal failure superimposed on stage 3b chronic kidney disease (HCC) 07/30/2020   Lower extremity cellulitis 02/04/2020   Acute on chronic diastolic heart failure (HCC) 11/23/2019   Vaginal discharge 11/06/2019   Morbid obesity (HCC) 08/22/2019   Opiate overdose (HCC) 08/20/2019   Cellulitis 10/05/2018   Acute cystitis without hematuria 10/05/2018   Essential hypertension 10/05/2018   Pressure injury of skin 10/05/2018   Hypoglycemia secondary to sulfonylurea 11/21/2016   Substance induced mood disorder (HCC) 07/02/2014   Encephalopathy    Hyperkalemia    Hypokalemia    Insulin  dependent type 2 diabetes mellitus (HCC)    Hyperlipidemia associated with  type 2 diabetes mellitus (HCC)    Anxiety and  depression    Anxiety state    PCP:  Clinic, Bonni Lien Pharmacy:   [id number] - [place], [place] - [address] AT [address] [place] [address] Phone: [phone] Fax: [phone]  [id number] - [address], [place] - [address] [place] [address] Phone: [phone] Fax: [phone]  [facility] [address] [place] [address] Phone: [phone] Fax: [phone]  Social Drivers of Health (SDOH) Social History: SDOH Screenings   Food Insecurity: Patient Declined (02/16/2023)  Housing: Unknown (02/16/2023)  Transportation Needs: Patient Unable To Answer (02/16/2023)  Utilities: Not At Risk (01/13/2023)  Financial Resource Strain: Patient Declined (05/16/2022)   Received from [facility]  Social Connections: Unknown (02/23/2023)  Stress: No Stress Concern Present (05/16/2022)   Received from [facility]  Tobacco Use: Medium Risk (03/08/2023)   SDOH Interventions:    Readmission Risk Interventions    03/28/2023   11:29 AM 02/23/2023   11:48 AM 10/07/2022   11:37 AM  Readmission Risk Prevention Plan  Transportation Screening Complete Complete Complete  PCP or Specialist Appt within 3-5 Days  Complete Complete  HRI or Home Care Consult  Complete Complete  Social Work Consult for Recovery Care Planning/Counseling  Complete Complete  Palliative Care Screening  Not Applicable Not Applicable  Medication Review Oceanographer) Complete Complete Complete  HRI or Home Care Consult Complete    SW Recovery Care/Counseling Consult Complete    Palliative Care Screening Not Applicable    Skilled Nursing Facility Not Applicable

## 2023-03-28 NOTE — Progress Notes (Signed)
Right lower extremity venous duplex has been completed. Preliminary results can be found in CV Proc through chart review.   03/28/23 9:36 AM Olen Cordial RVT

## 2023-03-28 NOTE — Progress Notes (Signed)
 Pharmacy: Re- heparin    Patient is a 68 y.o with CKD who presented to the ED on 03/27/23 with AMS and SOB. She is currently on heparin  drip for suspected PE.   - first heparin  level collected at 8:37p is therapeutic at 0.70, but is at the upper end of the goal range.  - cbc ok  - no bleeding documented  - scr 6.12 (crcl ~11)  Goal of Therapy:  Heparin  level 0.3-0.7 units/ml Monitor platelets by anticoagulation protocol: Yes  Plan: - Will decrease heparin  drip slightly to 1050 units/hr - check 8 hr heparin  level - monitor for s/sx bleeding   Iantha Batch, PharmD, BCPS 03/28/2023 9:03 PM

## 2023-03-28 NOTE — Progress Notes (Signed)
Left upper extremity venous duplex has been completed. Preliminary results can be found in CV Proc through chart review.   03/28/23 12:20 PM Olen Cordial RVT

## 2023-03-29 ENCOUNTER — Inpatient Hospital Stay (HOSPITAL_COMMUNITY): Payer: No Typology Code available for payment source

## 2023-03-29 ENCOUNTER — Encounter (HOSPITAL_COMMUNITY): Payer: No Typology Code available for payment source

## 2023-03-29 ENCOUNTER — Encounter (HOSPITAL_COMMUNITY): Payer: Self-pay | Admitting: Critical Care Medicine

## 2023-03-29 DIAGNOSIS — I2781 Cor pulmonale (chronic): Secondary | ICD-10-CM | POA: Diagnosis not present

## 2023-03-29 DIAGNOSIS — J9622 Acute and chronic respiratory failure with hypercapnia: Secondary | ICD-10-CM | POA: Diagnosis not present

## 2023-03-29 DIAGNOSIS — J9621 Acute and chronic respiratory failure with hypoxia: Secondary | ICD-10-CM | POA: Diagnosis not present

## 2023-03-29 LAB — BASIC METABOLIC PANEL
Anion gap: 13 (ref 5–15)
BUN: 69 mg/dL — ABNORMAL HIGH (ref 8–23)
CO2: 23 mmol/L (ref 22–32)
Calcium: 8.7 mg/dL — ABNORMAL LOW (ref 8.9–10.3)
Chloride: 101 mmol/L (ref 98–111)
Creatinine, Ser: 6.4 mg/dL — ABNORMAL HIGH (ref 0.44–1.00)
GFR, Estimated: 7 mL/min — ABNORMAL LOW (ref >60)
Glucose, Bld: 73 mg/dL (ref 70–99)
Potassium: 5.7 mmol/L — ABNORMAL HIGH (ref 3.5–5.1)
Sodium: 137 mmol/L (ref 135–145)

## 2023-03-29 LAB — CBC
HCT: 44.9 % (ref 36.0–46.0)
HCT: 44.8 % (ref 36.0–46.0)
Hemoglobin: 12.3 g/dL (ref 12.0–15.0)
Hemoglobin: 12.4 g/dL (ref 12.0–15.0)
MCH: 23.9 pg — ABNORMAL LOW (ref 26.0–34.0)
MCH: 23.8 pg — ABNORMAL LOW (ref 26.0–34.0)
MCHC: 27.4 g/dL — ABNORMAL LOW (ref 30.0–36.0)
MCHC: 27.7 g/dL — ABNORMAL LOW (ref 30.0–36.0)
MCV: 87.4 fL (ref 80.0–100.0)
MCV: 86 fL (ref 80.0–100.0)
Platelets: 235 10*3/uL (ref 150–400)
Platelets: 205 10*3/uL (ref 150–400)
RBC: 5.14 MIL/uL — ABNORMAL HIGH (ref 3.87–5.11)
RBC: 5.21 MIL/uL — ABNORMAL HIGH (ref 3.87–5.11)
RDW: 21.9 % — ABNORMAL HIGH (ref 11.5–15.5)
RDW: 22 % — ABNORMAL HIGH (ref 11.5–15.5)
WBC: 6.4 10*3/uL (ref 4.0–10.5)
WBC: 6.7 10*3/uL (ref 4.0–10.5)
nRBC: 0.3 % — ABNORMAL HIGH (ref 0.0–0.2)
nRBC: 0.3 % — ABNORMAL HIGH (ref 0.0–0.2)

## 2023-03-29 LAB — GLUCOSE, CAPILLARY
Glucose-Capillary: 132 mg/dL — ABNORMAL HIGH (ref 70–99)
Glucose-Capillary: 162 mg/dL — ABNORMAL HIGH (ref 70–99)
Glucose-Capillary: 69 mg/dL — ABNORMAL LOW (ref 70–99)
Glucose-Capillary: 122 mg/dL — ABNORMAL HIGH (ref 70–99)
Glucose-Capillary: 102 mg/dL — ABNORMAL HIGH (ref 70–99)

## 2023-03-29 LAB — HEPARIN LEVEL (UNFRACTIONATED)
Heparin Unfractionated: 0.88 [IU]/mL — ABNORMAL HIGH (ref 0.30–0.70)
Heparin Unfractionated: 0.44 [IU]/mL (ref 0.30–0.70)
Heparin Unfractionated: 0.42 [IU]/mL (ref 0.30–0.70)

## 2023-03-29 LAB — PROCALCITONIN: Procalcitonin: 0.16 ng/mL

## 2023-03-29 MED ORDER — DEXTROSE 50 % IV SOLN
INTRAVENOUS | Status: AC
  Administered 2023-03-29: 25 mL
  Filled 2023-03-29: qty 50

## 2023-03-29 MED ORDER — SODIUM CHLORIDE 0.9 % IV SOLN: INTRAVENOUS | Status: AC | PRN

## 2023-03-29 MED ORDER — CARMEX CLASSIC LIP BALM EX OINT
TOPICAL_OINTMENT | CUTANEOUS | Status: DC | PRN
  Filled 2023-03-29: qty 10

## 2023-03-29 NOTE — Plan of Care (Signed)
  Problem: Fluid Volume: Goal: Ability to maintain a balanced intake and output will improve Outcome: Progressing   Problem: Health Behavior/Discharge Planning: Goal: Ability to identify and utilize available resources and services will improve Outcome: Progressing Goal: Ability to manage health-related needs will improve Outcome: Progressing   Problem: Skin Integrity: Goal: Risk for impaired skin integrity will decrease Outcome: Progressing   Problem: Metabolic: Goal: Ability to maintain appropriate glucose levels will improve Outcome: Not Progressing   Problem: Nutritional: Goal: Maintenance of adequate nutrition will improve Outcome: Not Progressing

## 2023-03-29 NOTE — Progress Notes (Signed)
 PHARMACY - ANTICOAGULATION CONSULT NOTE  Pharmacy Consult for IV Heparin  Indication: pulmonary embolus  Allergies  Allergen Reactions   Lisinopril Swelling   Morphine  Hives, Itching and Rash   Penicillins Hives, Itching, Nausea And Vomiting and Rash   Metformin Other (See Comments)    Allergic, per caretaker   Sulfa Antibiotics Other (See Comments)    Hypoglycemia, per the [facility]    Patient Measurements: Height: 5' 2.5 (158.8 cm) Weight: 122.5 kg (270 lb 1 oz) IBW/kg (Calculated) : 51.25 Heparin  Dosing Weight: 81.7 kg  Vital Signs: Temp: 97.7 F (36.5 C) (02/05 1200) Temp Source: Bladder (02/05 1200) BP: 125/64 (02/05 1200) Pulse Rate: 76 (02/05 1200)  Labs: Recent Labs    03/27/23 1622 03/27/23 1910 03/27/23 2215 03/28/23 0314 03/28/23 1053 03/28/23 2037 03/29/23 0730  HGB 13.2  --  11.1*  --   --  12.2 12.3  HCT 48.5*  --  40.1  --   --  43.9 44.9  PLT 253  --  233  --   --  251 235  APTT  --   --   --   --  26  --   --   LABPROT  --   --   --   --  14.4  --   --   INR  --   --   --   --  1.1  --   --   HEPARINUNFRC  --   --   --   --   --  0.70 0.88*  CREATININE 6.12*  --  5.21* 6.15*  --   --   --   TROPONINIHS 120* 105* 91* 95*  --   --   --     Estimated Creatinine Clearance: 10.9 mL/min (A) (by C-G formula based on SCr of 6.15 mg/dL (H)).   Medical History: Past Medical History:  Diagnosis Date   Diabetes mellitus without complication (HCC)    Hypertension    Knee pain, chronic    Neuropathy in diabetes (HCC)     Medications: Infusions:   sodium chloride  10 mL/hr at 03/29/23 1111   ceFEPime  (MAXIPIME ) IV Stopped (03/28/23 2131)   heparin  800 Units/hr (03/29/23 1111)   linezolid  (ZYVOX ) IV Stopped (03/29/23 1033)     Brief Assessment: 68 yo female with a history of OHS, DM, HTN who presented by EMS after family found her with lower saturations after removing her bipap machine. Pharmacy consulted to dose IV heparin  for suspected PE.     Heparin  level 0.44, therapeutic on heparin  800 units/hr CBC: Hgb 12.4, Plt 205 No bleeding or complications reported by RN   Goal of Therapy:  Heparin  level 0.3-0.7 units/ml Monitor platelets by anticoagulation protocol: Yes   Plan:  Continue heparin  IV infusion at 800 units/hr  Recheck confirmatory heparin  level 6 hours  Monitor daily heparin  level, CBC, signs/symptoms of bleeding    Wanda Hasting PharmD, BCPS [facility] main pharmacy [phone] 03/29/2023 5:19 PM

## 2023-03-29 NOTE — Progress Notes (Signed)
 PHARMACY - ANTICOAGULATION CONSULT NOTE  Pharmacy Consult for IV Heparin  Indication: pulmonary embolus  Allergies  Allergen Reactions   Lisinopril Swelling   Morphine  Hives, Itching and Rash   Penicillins Hives, Itching, Nausea And Vomiting and Rash   Metformin Other (See Comments)    Allergic, per caretaker   Sulfa Antibiotics Other (See Comments)    Hypoglycemia, per the [facility]    Patient Measurements: Height: 5' 2.5 (158.8 cm) Weight: 122.5 kg (270 lb 1 oz) IBW/kg (Calculated) : 51.25 Heparin  Dosing Weight: 81.7 kg  Vital Signs: Temp: 97.7 F (36.5 C) (02/05 2111) Temp Source: Bladder (02/05 1945) BP: 166/72 (02/05 2111) Pulse Rate: 81 (02/05 2111)  Labs: Recent Labs    03/27/23 1910 03/27/23 2215 03/27/23 2215 03/28/23 0314 03/28/23 1053 03/28/23 2037 03/29/23 0730 03/29/23 1552 03/29/23 1626 03/29/23 2140  HGB  --  11.1*  --   --   --  12.2 12.3  --  12.4  --   HCT  --  40.1  --   --   --  43.9 44.9  --  44.8  --   PLT  --  233  --   --   --  251 235  --  205  --   APTT  --   --   --   --  26  --   --   --   --   --   LABPROT  --   --   --   --  14.4  --   --   --   --   --   INR  --   --   --   --  1.1  --   --   --   --   --   HEPARINUNFRC  --   --    < >  --   --  0.70 0.88* 0.44  --  0.42  CREATININE  --  5.21*  --  6.15*  --   --   --   --  6.40*  --   TROPONINIHS 105* 91*  --  95*  --   --   --   --   --   --    < > = values in this interval not displayed.    Estimated Creatinine Clearance: 10.5 mL/min (A) (by C-G formula based on SCr of 6.4 mg/dL (H)).   Medical History: Past Medical History:  Diagnosis Date   Diabetes mellitus without complication (HCC)    Hypertension    Knee pain, chronic    Neuropathy in diabetes (HCC)     Medications: Infusions:   sodium chloride  10 mL/hr at 03/29/23 2100   heparin  800 Units/hr (03/29/23 2100)   linezolid  (ZYVOX ) IV 600 mg (03/29/23 2116)     Brief Assessment: 68 yo female with a history of  OHS, DM, HTN who presented by EMS after family found her with lower saturations after removing her bipap machine. Pharmacy consulted to dose IV heparin  for suspected PE.    Heparin  level 0.44, therapeutic on heparin  800 units/hr CBC: Hgb 12.4, Plt 205 No bleeding or complications reported by RN  Confirmatory HL @ 2140=0.42 No complications of therapy reported  Goal of Therapy:  Heparin  level 0.3-0.7 units/ml Monitor platelets by anticoagulation protocol: Yes   Plan:  Continue heparin  IV infusion at 800 units/hr  Monitor daily heparin  level, CBC, signs/symptoms of bleeding    Leeroy Mace RPh 03/29/2023, 10:05 PM

## 2023-03-29 NOTE — Progress Notes (Signed)
   03/28/23 2118  BiPAP/CPAP/SIPAP  BiPAP/CPAP/SIPAP Pt Type Adult  BiPAP/CPAP/SIPAP V60  Mask Type Full face mask  Dentures removed? Not applicable  Mask Size Medium  Set Rate 15 breaths/min  Respiratory Rate 20 breaths/min  IPAP 20 cmH20  EPAP 6 cmH2O  FiO2 (%) 40 %  Minute Ventilation 9.2  Leak 3  Peak Inspiratory Pressure (PIP) 20  Tidal Volume (Vt) 457  Patient Home Equipment No  Auto Titrate No  Press High Alarm 30 cmH2O  Press Low Alarm 5 cmH2O  CPAP/SIPAP surface wiped down Yes  BiPAP/CPAP /SiPAP Vitals  Pulse Rate 76  Resp 20  BP (!) 124/59  SpO2 97 %  Bilateral Breath Sounds Diminished  MEWS Score/Color  MEWS Score 0  MEWS Score Color Green

## 2023-03-29 NOTE — Progress Notes (Signed)
   03/29/23 2111  BiPAP/CPAP/SIPAP  BiPAP/CPAP/SIPAP Pt Type Adult  BiPAP/CPAP/SIPAP V60  Mask Type Full face mask  Dentures removed? Not applicable  Mask Size Medium  Set Rate 15 breaths/min  Respiratory Rate 18 breaths/min  IPAP 20 cmH20  EPAP 6 cmH2O  FiO2 (%) 40 %  Minute Ventilation 7.1  Leak 0  Peak Inspiratory Pressure (PIP) 20  Tidal Volume (Vt) 418  Patient Home Equipment No  Auto Titrate No  Press High Alarm 30 cmH2O  Press Low Alarm 5 cmH2O  CPAP/SIPAP surface wiped down Yes  BiPAP/CPAP /SiPAP Vitals  Resp 18  BP (!) 166/72  Bilateral Breath Sounds Diminished

## 2023-03-29 NOTE — Plan of Care (Addendum)
 Patient schedule for VQ scan in AM, more confused throughout the night, wearing BiPAP throughout the night, still complaining of neuropathy pain in right foot. Heparin  gtt now at 8 units  Problem: Education: Goal: Ability to describe self-care measures that may prevent or decrease complications (Diabetes Survival Skills Education) will improve Outcome: Progressing Goal: Individualized Educational Video(s) Outcome: Progressing   Problem: Coping: Goal: Ability to adjust to condition or change in health will improve Outcome: Progressing   Problem: Fluid Volume: Goal: Ability to maintain a balanced intake and output will improve Outcome: Progressing   Problem: Health Behavior/Discharge Planning: Goal: Ability to identify and utilize available resources and services will improve Outcome: Progressing Goal: Ability to manage health-related needs will improve Outcome: Progressing   Problem: Metabolic: Goal: Ability to maintain appropriate glucose levels will improve Outcome: Progressing   Problem: Nutritional: Goal: Maintenance of adequate nutrition will improve Outcome: Progressing Goal: Progress toward achieving an optimal weight will improve Outcome: Progressing   Problem: Skin Integrity: Goal: Risk for impaired skin integrity will decrease Outcome: Progressing   Problem: Tissue Perfusion: Goal: Adequacy of tissue perfusion will improve Outcome: Progressing   Problem: Education: Goal: Knowledge of General Education information will improve Description: Including pain rating scale, medication(s)/side effects and non-pharmacologic comfort measures Outcome: Progressing   Problem: Health Behavior/Discharge Planning: Goal: Ability to manage health-related needs will improve Outcome: Progressing   Problem: Clinical Measurements: Goal: Ability to maintain clinical measurements within normal limits will improve Outcome: Progressing Goal: Will remain free from infection Outcome:  Progressing Goal: Diagnostic test results will improve Outcome: Progressing Goal: Respiratory complications will improve Outcome: Progressing Goal: Cardiovascular complication will be avoided Outcome: Progressing   Problem: Activity: Goal: Risk for activity intolerance will decrease Outcome: Progressing   Problem: Nutrition: Goal: Adequate nutrition will be maintained Outcome: Progressing   Problem: Coping: Goal: Level of anxiety will decrease Outcome: Progressing   Problem: Elimination: Goal: Will not experience complications related to bowel motility Outcome: Progressing Goal: Will not experience complications related to urinary retention Outcome: Progressing   Problem: Pain Managment: Goal: General experience of comfort will improve and/or be controlled Outcome: Progressing   Problem: Safety: Goal: Ability to remain free from injury will improve Outcome: Progressing   Problem: Skin Integrity: Goal: Risk for impaired skin integrity will decrease Outcome: Progressing

## 2023-03-29 NOTE — Progress Notes (Addendum)
 PHARMACY - ANTICOAGULATION CONSULT NOTE  Pharmacy Consult for IV Heparin  Indication: pulmonary embolus  Allergies  Allergen Reactions   Lisinopril Swelling   Morphine  Hives, Itching and Rash   Penicillins Hives, Itching, Nausea And Vomiting and Rash   Metformin Other (See Comments)    Allergic, per caretaker   Sulfa Antibiotics Other (See Comments)    Hypoglycemia, per the [facility]    Patient Measurements: Height: 5' 2.5 (158.8 cm) Weight: 122.5 kg (270 lb 1 oz) IBW/kg (Calculated) : 51.25 Heparin  Dosing Weight: 81.7 kg  Vital Signs: Temp: 97 F (36.1 C) (02/05 0700) Temp Source: Bladder (02/05 0600) BP: 115/45 (02/05 0818) Pulse Rate: 70 (02/05 0818)  Labs: Recent Labs    03/27/23 1622 03/27/23 1910 03/27/23 2215 03/28/23 0314 03/28/23 1053 03/28/23 2037 03/29/23 0730  HGB 13.2  --  11.1*  --   --  12.2 12.3  HCT 48.5*  --  40.1  --   --  43.9 44.9  PLT 253  --  233  --   --  251 235  APTT  --   --   --   --  26  --   --   LABPROT  --   --   --   --  14.4  --   --   INR  --   --   --   --  1.1  --   --   HEPARINUNFRC  --   --   --   --   --  0.70 0.88*  CREATININE 6.12*  --  5.21* 6.15*  --   --   --   TROPONINIHS 120* 105* 91* 95*  --   --   --     Estimated Creatinine Clearance: 10.9 mL/min (A) (by C-G formula based on SCr of 6.15 mg/dL (H)).   Medical History: Past Medical History:  Diagnosis Date   Diabetes mellitus without complication (HCC)    Hypertension    Knee pain, chronic    Neuropathy in diabetes (HCC)     Medications:  No prior to admission anticoagulant meds listed On heparin  5000 units subcutaneous q8h for DVT prophylaxis, last dose 2/4 0739  Assessment: Pharmacy consulted to dose IV heparin  for this 68 yo female with a history of OHS, DM, HTN who presented by EMS after family found her with lower saturations after removing her bipap machine.   2/3 D-Dimer elevated at 1.05 2/4 VAS US  Lower Extremity Venous: No evidence of DVT in  lower extremity 2/4 ECHO: McConnell's sign is present consistent with acute cor pulmonale, such as acute pulmonary embolism or other episode of acute increase in pulmonary vascular resistance.   Heparin  level now SUPRAtherapeutic after being therapeutic last night on IV heparin  rate of of 1050 units/hr CBC stable No reported bleeding or issues per RN   Goal of Therapy:  Heparin  level 0.3-0.7 units/ml Monitor platelets by anticoagulation protocol: Yes   Plan:  Reduce IV heparin  from 1050 to 800 units/hr Recheck heparin  level 6 hours after rate change Monitor daily heparin  level, CBC, signs/symptoms of bleeding    Eva CHRISTELLA Allis, PharmD, BCPS Secure Chat if ?s 03/29/2023 8:48 AM

## 2023-03-29 NOTE — Progress Notes (Addendum)
 NAME:  [name] [name], MRN:  [id number], DOB:  09-Jul-1955, LOS: 2 ADMISSION DATE:  03/27/2023, CONSULTATION DATE:  2/3 REFERRING MD:  Kommor-EDP, CHIEF COMPLAINT:  respiratory failure   History of Present Illness:  [name] [name] is a 68 y/o woman with a history of OHS, DM, HTN who presented by EMS after family found her with lower saturations after removing her bipap machine. She was saturating 73% when she was found by her aid. She was brought to the ED for confusion and has been on bipap. Per her sister [name] [name], she has been pulling off her bipap while she is asleep- they have found her without it a few times. Her sister has been encouraging her to wear it and leave it on overnight. She complaining of worse neuropathic pain yesterday than her baseline. Per her sister, she called EMS about this but refused hospital transport last night.  She has not had sick contacts.  Her sister is concerned that she has had poor p.o. intake recently and has not been doing that she needs to do at home despite encouragement.  She was on gabapentin  until her admission in December.  During that admission she had acute kidney injury with renal failure that improved with diuresis.  In the emergency department she was found to have respiratory acidosis and started on BiPAP.  Started on norepinephrine  just before my exam due to hypotension.  Pertinent  Medical History  OHS Chronic respiratory failure on 4L O2, nocturnal BiPAP Morbid obesity CKD HTN DM Peripheral neuropathy  Significant Hospital Events: Including procedures, antibiotic start and stop dates in addition to other pertinent events   2/3 admission- bipap, NE, empiric antibiotics  Interim History / Subjective:  Oriented to self, intermittently able to carry a conversation Wore bipap overnight. Endorses hunger Granddaughter notes non adherence to bipap at home,  Objective   Blood pressure 125/64, pulse 76, temperature 97.7 F (36.5 C), temperature source  Bladder, resp. rate 19, height 5' 2.5 (1.588 m), weight 122.5 kg, SpO2 96%.    FiO2 (%):  [40 %] 40 %   Intake/Output Summary (Last 24 hours) at 03/29/2023 1537 Last data filed at 03/29/2023 1111 Gross per 24 hour  Intake 1795.14 ml  Output 750 ml  Net 1045.14 ml   Filed Weights   03/28/23 0000 03/28/23 0500 03/29/23 0526  Weight: 122.7 kg 122.7 kg 122.5 kg    Examination: Chronically ill, on nasal cannula Breathing non labored Morbidly obese Abdomen distended, soft No peripheral edema  CBC WBC 6.4 Hgb 12.3 BMET pending  Chest xray 2/3 reviewed - baseline cardiomegaly, low lung volumes, no acute process  Resolved Hospital Problem list   shock  Assessment & Plan:  Acute on chronic respiratory failure with hypoxia and hypercapnia due to likely BiPAP noncompliance Baseline OSA and OHS - Serial ABGs - Continue BiPAP at bedtime with nasal cannula during the day 5L baseline. - cultures and procal negative. Given encephalopathy will stop cefepime   Acute on chronic cor pulmonale High probability pulmonary embolism, submassive criteria Troponin elevation - chronic RV incompetence but this has acutely worsened since last scan with mcconnell's sign. High concern clinically for pulmonary embolism - since she is not able to get CTPE study due to elevated Cr, will empirically start heparin  gtt. Lower extremity dopplers and V/Q scan ordered.  - would not give any thrombolytic therapy without confirming diagnosis. Her age and functional status preclude aggressive procedures. Would consider systemic thrombolytics if positive for PE.   Hyperkalemia AKI  on CKD 4 - initially received 2LS, closely monitor UOP - no immediate need for RRT  Chronic hypotension - Continue home midodrine   Acute encephalopathy - unclear chronicity, trying to get collateral from family - Resumed lyrica  at reduced dose due to uncontrolled neuropathy  Diabetes with uncontrolled hyperglycemia; A1c 9.2 in  12/1022 - Resistant sliding scale every 4 until eating -goal BG 140-180   Best Practice (right click and Reselect all SmartList Selections daily)   Diet/type: Regular consistency (see orders) DVT prophylaxis prophylactic heparin   Pressure ulcer(s): none on admission GI prophylaxis: N/A Lines: N/A Foley:  Yes, and it is still needed Code Status:  full code Last date of multidisciplinary goals of care discussion [I spoke with granddaughter [name] [name] who deferred to patient's sister [name] [name] for medical updates and decisions. Called [name] [name] 2/5 at 3:30 pm and left a message for call back.]   I spent 50 minutes in total visit time for this patient, with more than 50% spent counseling/coordinating care.  Verdon Gore, MD Pulmonary and Critical Care Medicine [facility] 03/29/2023 3:39 PM Pager: see AMION  If no response to pager, please call critical care on call (see AMION) until 7pm After 7:00 pm call Elink

## 2023-03-30 ENCOUNTER — Inpatient Hospital Stay (HOSPITAL_COMMUNITY): Payer: No Typology Code available for payment source

## 2023-03-30 DIAGNOSIS — R609 Edema, unspecified: Secondary | ICD-10-CM | POA: Diagnosis not present

## 2023-03-30 DIAGNOSIS — J9621 Acute and chronic respiratory failure with hypoxia: Secondary | ICD-10-CM | POA: Diagnosis not present

## 2023-03-30 DIAGNOSIS — J9622 Acute and chronic respiratory failure with hypercapnia: Secondary | ICD-10-CM | POA: Diagnosis not present

## 2023-03-30 DIAGNOSIS — N184 Chronic kidney disease, stage 4 (severe): Secondary | ICD-10-CM | POA: Diagnosis not present

## 2023-03-30 DIAGNOSIS — E875 Hyperkalemia: Secondary | ICD-10-CM | POA: Diagnosis not present

## 2023-03-30 LAB — CBC
HCT: 43.8 % (ref 36.0–46.0)
Hemoglobin: 12.1 g/dL (ref 12.0–15.0)
MCH: 23.5 pg — ABNORMAL LOW (ref 26.0–34.0)
MCHC: 27.6 g/dL — ABNORMAL LOW (ref 30.0–36.0)
MCV: 85 fL (ref 80.0–100.0)
Platelets: 262 10*3/uL (ref 150–400)
RBC: 5.15 MIL/uL — ABNORMAL HIGH (ref 3.87–5.11)
RDW: 21.6 % — ABNORMAL HIGH (ref 11.5–15.5)
WBC: 7.3 10*3/uL (ref 4.0–10.5)
nRBC: 0.3 % — ABNORMAL HIGH (ref 0.0–0.2)

## 2023-03-30 LAB — GLUCOSE, CAPILLARY
Glucose-Capillary: 115 mg/dL — ABNORMAL HIGH (ref 70–99)
Glucose-Capillary: 125 mg/dL — ABNORMAL HIGH (ref 70–99)
Glucose-Capillary: 98 mg/dL (ref 70–99)
Glucose-Capillary: 109 mg/dL — ABNORMAL HIGH (ref 70–99)
Glucose-Capillary: 84 mg/dL (ref 70–99)

## 2023-03-30 LAB — BASIC METABOLIC PANEL
Anion gap: 15 (ref 5–15)
BUN: 68 mg/dL — ABNORMAL HIGH (ref 8–23)
CO2: 24 mmol/L (ref 22–32)
Calcium: 8.9 mg/dL (ref 8.9–10.3)
Chloride: 97 mmol/L — ABNORMAL LOW (ref 98–111)
Creatinine, Ser: 6.5 mg/dL — ABNORMAL HIGH (ref 0.44–1.00)
GFR, Estimated: 6 mL/min — ABNORMAL LOW (ref >60)
Glucose, Bld: 105 mg/dL — ABNORMAL HIGH (ref 70–99)
Potassium: 5.8 mmol/L — ABNORMAL HIGH (ref 3.5–5.1)
Sodium: 136 mmol/L (ref 135–145)

## 2023-03-30 LAB — HEPARIN LEVEL (UNFRACTIONATED)
Heparin Unfractionated: 0.19 [IU]/mL — ABNORMAL LOW (ref 0.30–0.70)
Heparin Unfractionated: 0.42 [IU]/mL (ref 0.30–0.70)

## 2023-03-30 MED ORDER — ORAL CARE MOUTH RINSE
15.0000 mL | OROMUCOSAL | Status: DC
  Administered 2023-03-30 – 2023-04-05 (×19): 15 mL via OROMUCOSAL

## 2023-03-30 MED ORDER — SODIUM ZIRCONIUM CYCLOSILICATE 5 G PO PACK
5.0000 g | PACK | Freq: Once | ORAL | Status: DC
  Filled 2023-03-30: qty 1

## 2023-03-30 MED ORDER — CAMPHOR-MENTHOL 0.5-0.5 % EX LOTN
TOPICAL_LOTION | CUTANEOUS | Status: DC | PRN
  Administered 2023-04-05: 1 via TOPICAL
  Filled 2023-03-30 (×2): qty 222

## 2023-03-30 MED ORDER — SODIUM ZIRCONIUM CYCLOSILICATE 10 G PO PACK
10.0000 g | PACK | Freq: Two times a day (BID) | ORAL | Status: DC
  Administered 2023-03-31: 10 g via ORAL
  Filled 2023-03-30 (×4): qty 1

## 2023-03-30 MED ORDER — SODIUM CHLORIDE 0.9 % IV SOLN: INTRAVENOUS | Status: AC | PRN

## 2023-03-30 NOTE — Progress Notes (Signed)
 Patient order Lokelma  stat for a potassium of 5.8, this nurse and her son tried getting her to take medication, however patient continue to refuse, Dr. Asa Lauth is aware, instructed to pass on to day team.

## 2023-03-30 NOTE — Progress Notes (Signed)
 NAME:  CAROLL WEINHEIMER, MRN:  [id number], DOB:  Sep 03, 1955, LOS: 3 ADMISSION DATE:  03/27/2023, CONSULTATION DATE:  2/3 REFERRING MD:  Kommor-EDP, CHIEF COMPLAINT:  respiratory failure   History of Present Illness:  Ms. Lewter is a 68 y/o woman with a history of OHS, DM, HTN who presented by EMS after family found her with lower saturations after removing her bipap machine. She was saturating 73% when she was found by her aid. She was brought to the ED for confusion and has been on bipap. Per [name], she has been pulling off her bipap while she is asleep- they have found her without it a few times. Her sister has been encouraging her to wear it and leave it on overnight. She complaining of worse neuropathic pain yesterday than her baseline. Per her sister, she called EMS about this but refused hospital transport last night.  She has not had sick contacts.  Her sister is concerned that she has had poor p.o. intake recently and has not been doing that she needs to do at home despite encouragement.  She was on gabapentin  until her admission in December.  During that admission she had acute kidney injury with renal failure that improved with diuresis.  In the emergency department she was found to have respiratory acidosis and started on BiPAP.  Started on norepinephrine  just before my exam due to hypotension.  Pertinent  Medical History  OHS Chronic respiratory failure on 4L O2, nocturnal BiPAP Morbid obesity CKD HTN DM Peripheral neuropathy  Significant Hospital Events: Including procedures, antibiotic start and stop dates in addition to other pertinent events   2/3 admission- bipap, NE, empiric antibiotics  Interim History / Subjective:  Wore bipap overnight. Refusing medications. Nonverbal except when son was here overnight. C/o neuropathy Objective   Blood pressure (!) 162/74, pulse 76, temperature 97.9 F (36.6 C), resp. rate 13, height 5' 2.5 (1.588 m), weight 123.5 kg, SpO2  94%.    FiO2 (%):  [40 %] 40 %   Intake/Output Summary (Last 24 hours) at 03/30/2023 1015 Last data filed at 03/30/2023 0700 Gross per 24 hour  Intake 1092.08 ml  Output 925 ml  Net 167.08 ml   Filed Weights   03/28/23 0500 03/29/23 0526 03/30/23 0500  Weight: 122.7 kg 122.5 kg 123.5 kg    Examination: Chronically ill appearing, on nasal cannula Laying flat comfortably   CBC WBC 6.4 Hgb 12.3 BMET pending  Chest xray 2/3 reviewed - baseline cardiomegaly, low lung volumes, no acute process  Resolved Hospital Problem list   shock  Assessment & Plan:  Acute on chronic cor pulmonale High probability pulmonary embolism, submassive criteria Troponin elevation - chronic RV incompetence but this has acutely worsened since last scan with mcconnell's sign. High concern clinically for pulmonary embolism - since she is not able to get CTPE study due to elevated Cr, continue empiric heparin  gtt. Lower extremity dopplers negative and V/Q scan ordered.  - would not give any thrombolytic therapy without confirming diagnosis. Her age and functional status preclude aggressive procedures. Would consider systemic thrombolytics if positive for PE.   Hyperkalemia AKI on CKD 4 - refusing lokelma . Will keep trying to offer.  - no immediate need for RRT- consulting nephrology. Suspect she is reaching a new plateau and would not be a good candidate for long term RRT.   Acute on chronic respiratory failure with hypoxia and hypercapnia due to likely BiPAP noncompliance Baseline OSA and OHS - back to baseline -  Continue BiPAP at bedtime with nasal cannula during the day 5L baseline. - cultures and procal negative. Stopped all abx today.   Chronic hypotension - Continue home midodrine   Acute encephalopathy - unclear chronicity, trying to get collateral from family - stopping lyrica  due to CKD.   Diabetes with uncontrolled hyperglycemia; A1c 9.2 in 12/1022 - Resistant sliding scale every 4 until  eating -goal BG 140-180   Best Practice (right click and Reselect all SmartList Selections daily)   Diet/type: Regular consistency (see orders) DVT prophylaxis systemic heparin  Pressure ulcer(s): none on admission GI prophylaxis: N/A Lines: N/A Foley:  Yes, and it is still needed Code Status:  full code Last date of multidisciplinary goals of care discussion [Updated sister essie and shirley at bedside. Advised them that Essie has congestive heart failure, renal failure, and chronic respiratory failure and that any one of these problems could be terminal, but for her is particularly devastating. Essie is hopeful and prayerful to treat the treatable and is not ready to confront Junice's mortality. She feels her son Francis needs to be involved in conversations and is agreeable to family meeting. I have expressed that she is not going to tolerate long term dialysis due to the heart failure and that my concern is the end may be sooner rather than later. ]  I spent 50 minutes in total visit time for this patient, with more than 50% spent counseling/coordinating care.  Verdon Gore, MD Pulmonary and Critical Care Medicine [facility] 03/30/2023 10:15 AM Pager: see AMION  If no response to pager, please call critical care on call (see AMION) until 7pm After 7:00 pm call Elink

## 2023-03-30 NOTE — Progress Notes (Signed)
 Progress update:  V/Q scan negative for PE. This means that her severe RV failure is all chronic mediated by sleep disordered breathing,  hypoxemia and obesity. 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 Will stop heparin  gtt. Transfer to [facility] as she is at her baseline oxygen  and nocturnal bipap for OHS/OSA. Will sign out to [facility] to assume care 2/7. Have discussed with palliative care and nephrology. Given bed bound status, RV failure, suspect she is not a candidate for hemodialysis.  Verdon Gore, MD Pulmonary and Critical Care Medicine [facility] 03/30/2023 1:40 PM Pager: see AMION  If no response to pager, please call critical care on call (see AMION) until 7pm After 7:00 pm call Elink

## 2023-03-30 NOTE — Progress Notes (Signed)
   03/30/23 2236  BiPAP/CPAP/SIPAP  BiPAP/CPAP/SIPAP Pt Type Adult  BiPAP/CPAP/SIPAP V60  Mask Type Full face mask  Dentures removed? Not applicable  Mask Size Medium  Set Rate 15 breaths/min  Respiratory Rate 18 breaths/min  IPAP 20 cmH20  EPAP 6 cmH2O  FiO2 (%) 40 %  Minute Ventilation 6  Leak 0  Peak Inspiratory Pressure (PIP) 20  Tidal Volume (Vt) 344  Patient Home Equipment No  Auto Titrate No  Press High Alarm 30 cmH2O  Press Low Alarm 5 cmH2O  CPAP/SIPAP surface wiped down Yes  BiPAP/CPAP /SiPAP Vitals  Pulse Rate 76  Resp 18  BP 137/69  Bilateral Breath Sounds Diminished

## 2023-03-30 NOTE — Progress Notes (Signed)
 PHARMACY - ANTICOAGULATION CONSULT NOTE  Pharmacy Consult for IV Heparin  Indication: pulmonary embolus  Allergies  Allergen Reactions   Lisinopril Swelling   Morphine  Hives, Itching and Rash   Penicillins Hives, Itching, Nausea And Vomiting and Rash   Metformin Other (See Comments)    Allergic, per caretaker   Sulfa Antibiotics Other (See Comments)    Hypoglycemia, per the [facility]    Patient Measurements: Height: 5' 2.5 (158.8 cm) Weight: 122.5 kg (270 lb 1 oz) IBW/kg (Calculated) : 51.25 Heparin  Dosing Weight: 81.7 kg  Vital Signs: Temp: 97.2 F (36.2 C) (02/06 0400) Temp Source: Bladder (02/06 0000) BP: 148/60 (02/06 0400) Pulse Rate: 70 (02/06 0400)  Labs: Recent Labs    03/27/23 1910 03/27/23 2215 03/28/23 0314 03/28/23 1053 03/28/23 2037 03/29/23 0730 03/29/23 1552 03/29/23 1626 03/29/23 2140 03/30/23 0310  HGB  --  11.1*  --   --    < > 12.3  --  12.4  --  12.1  HCT  --  40.1  --   --    < > 44.9  --  44.8  --  43.8  PLT  --  233  --   --    < > 235  --  205  --  262  APTT  --   --   --  26  --   --   --   --   --   --   LABPROT  --   --   --  14.4  --   --   --   --   --   --   INR  --   --   --  1.1  --   --   --   --   --   --   HEPARINUNFRC  --   --   --   --    < > 0.88* 0.44  --  0.42 0.19*  CREATININE  --  5.21* 6.15*  --   --   --   --  6.40*  --  6.50*  TROPONINIHS 105* 91* 95*  --   --   --   --   --   --   --    < > = values in this interval not displayed.    Estimated Creatinine Clearance: 10.3 mL/min (A) (by C-G formula based on SCr of 6.5 mg/dL (H)).   Medical History: Past Medical History:  Diagnosis Date   Diabetes mellitus without complication (HCC)    Hypertension    Knee pain, chronic    Neuropathy in diabetes (HCC)     Medications: Infusions:   sodium chloride  10 mL/hr at 03/30/23 0400   heparin  800 Units/hr (03/30/23 0400)   linezolid  (ZYVOX ) IV Stopped (03/29/23 2216)     Brief Assessment: 68 yo female with a  history of OHS, DM, HTN who presented by EMS after family found her with lower saturations after removing her bipap machine. Pharmacy consulted to dose IV heparin  for suspected PE.    03/30/2023 HL 0.19 subtherapeutic on 800 units/hr CBC WNL Per RN no interruptions and no bleeding  Goal of Therapy:  Heparin  level 0.3-0.7 units/ml Monitor platelets by anticoagulation protocol: Yes   Plan:  Increase heparin  drip to 1000 units/hr Heparin  level in 8 hours Monitor daily heparin  level, CBC, signs/symptoms of bleeding    Leeroy Mace RPh 03/30/2023, 4:50 AM
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
## 2023-03-31 DIAGNOSIS — Z515 Encounter for palliative care: Secondary | ICD-10-CM

## 2023-03-31 DIAGNOSIS — Z7189 Other specified counseling: Secondary | ICD-10-CM | POA: Diagnosis not present

## 2023-03-31 DIAGNOSIS — J9602 Acute respiratory failure with hypercapnia: Secondary | ICD-10-CM

## 2023-03-31 DIAGNOSIS — J9621 Acute and chronic respiratory failure with hypoxia: Secondary | ICD-10-CM | POA: Diagnosis not present

## 2023-03-31 DIAGNOSIS — N1832 Chronic kidney disease, stage 3b: Secondary | ICD-10-CM

## 2023-03-31 DIAGNOSIS — I5032 Chronic diastolic (congestive) heart failure: Secondary | ICD-10-CM

## 2023-03-31 LAB — CBC
HCT: 46.3 % — ABNORMAL HIGH (ref 36.0–46.0)
Hemoglobin: 13.1 g/dL (ref 12.0–15.0)
MCH: 24.3 pg — ABNORMAL LOW (ref 26.0–34.0)
MCHC: 28.3 g/dL — ABNORMAL LOW (ref 30.0–36.0)
MCV: 85.7 fL (ref 80.0–100.0)
Platelets: 257 10*3/uL (ref 150–400)
RBC: 5.4 MIL/uL — ABNORMAL HIGH (ref 3.87–5.11)
RDW: 21.4 % — ABNORMAL HIGH (ref 11.5–15.5)
WBC: 5.5 10*3/uL (ref 4.0–10.5)
nRBC: 0 % (ref 0.0–0.2)

## 2023-03-31 LAB — GLUCOSE, CAPILLARY
Glucose-Capillary: 87 mg/dL (ref 70–99)
Glucose-Capillary: 86 mg/dL (ref 70–99)
Glucose-Capillary: 91 mg/dL (ref 70–99)
Glucose-Capillary: 81 mg/dL (ref 70–99)

## 2023-03-31 LAB — HEPARIN LEVEL (UNFRACTIONATED): Heparin Unfractionated: <0.1 [IU]/mL — ABNORMAL LOW (ref 0.30–0.70)

## 2023-03-31 LAB — BASIC METABOLIC PANEL
Anion gap: 15 (ref 5–15)
BUN: 66 mg/dL — ABNORMAL HIGH (ref 8–23)
CO2: 25 mmol/L (ref 22–32)
Calcium: 9.3 mg/dL (ref 8.9–10.3)
Chloride: 100 mmol/L (ref 98–111)
Creatinine, Ser: 5.85 mg/dL — ABNORMAL HIGH (ref 0.44–1.00)
GFR, Estimated: 7 mL/min — ABNORMAL LOW (ref >60)
Glucose, Bld: 79 mg/dL (ref 70–99)
Potassium: 5.4 mmol/L — ABNORMAL HIGH (ref 3.5–5.1)
Sodium: 140 mmol/L (ref 135–145)

## 2023-03-31 LAB — SODIUM, URINE, RANDOM: Sodium, Ur: 17 mmol/L

## 2023-03-31 LAB — CREATININE, URINE, RANDOM: Creatinine, Urine: 154 mg/dL

## 2023-03-31 LAB — MAGNESIUM: Magnesium: 1.5 mg/dL — ABNORMAL LOW (ref 1.7–2.4)

## 2023-03-31 MED ORDER — HEPARIN SODIUM (PORCINE) 5000 UNIT/ML IJ SOLN
5000.0000 [IU] | Freq: Three times a day (TID) | INTRAMUSCULAR | Status: DC
  Administered 2023-03-31 – 2023-04-06 (×18): 5000 [IU] via SUBCUTANEOUS
  Filled 2023-03-31 (×18): qty 1

## 2023-03-31 MED ORDER — MAGNESIUM SULFATE IN D5W 1-5 GM/100ML-% IV SOLN
1.0000 g | Freq: Once | INTRAVENOUS | Status: AC
  Administered 2023-03-31: 1 g via INTRAVENOUS
  Filled 2023-03-31: qty 100

## 2023-03-31 NOTE — Progress Notes (Signed)
 [facility] Progress Note  Subjective: creat down today at 5.8. pt remains somnolent and not responding to simple questions.   Vitals:   03/31/23 1200 03/31/23 1300 03/31/23 1400 03/31/23 1500  BP: (!) 141/64 (!) 145/68 (!) 161/65 (!) 167/71  Pulse: 78 79 81 88  Resp: 13 14 16    Temp: 98.4 F (36.9 C) 98.4 F (36.9 C) 98.6 F (37 C) 99 F (37.2 C)  TempSrc:      SpO2: 94% 97% 95% 96%  Weight:      Height:        Exam: Gen groggy, difficult to communicate due to somnolence Morbid obesity No rash, cyanosis or gangrene Sclera anicteric, throat clear  No jvd or bruits Chest clear bilat ant and lateral RRR no RG Abd soft obese ntnd no mass or ascites +bs GU defer MS no joint effusions or deformity Ext no pitting LE or UE edema, no other edema Neuro is as above       Renal-related home meds: - klor-con  20 every day - torsemide  40 bid - midodrine  5 tid   Date                             Creat               eGFR (ml/min) 2016                            12.5 >> 1.69    AKI 2018                            1.97 >> 1.65 2020                            1.59- 1.88 2021                            0.83- 7.15 2022                            1.25- 3.31 2023                            0.97- 3.63 Aug- dec 2024             1.52- 5.55        8- 37 ml/min  Jan 2025                     2.13- 2.52        20- 25 ml/min   03/27/23                        6.12, 5.21 03/28/23                        6.15 03/29/23                        6.40 03/30/23                        6.50   Total I/O = 2.8 L in and 1.7 L out = +  1.2L  CXR #1 2/03 - no edema/ congestion  CXR #2 2/05 - showed new vasc congestion UA 2/3 - hazy, small LE, neg prot, 0-5 wbc/ rbc/ epis CT abd no contrast 2/03 - bilat kidneys w/o obstruction, in nl position, no stones or masses  Today labs --> Na 136  K 5.u8  CO2 24  BUN 68  creat 6.50    Hb 12   Assessment/ Plan: AKI on CKD 3b - b/l creatinine is 1.5- 2.1  from late 2024 to jan 2025, eGFR 25- 37 ml/min. Creat here was 6.1 on admission and 6.5 today in the setting of severe R heart failure/ hypoperfusion. Pt is not oliguric at this time. UA is negative, urine lytes are c/w pre-renal which includes cardiorenal syndrome/ hypoperfusion. CT abdomen shows 2 kidneys w/o obstruction. Suspect AKI is d/t cardiorenal syndrome related to RV failure. No vol overload on exam. Pt is making urine. Due to debility/ immobility and severe comorbidities pt is not a candidate for dialysis. Recommend supportive care. Will follow.  Acute/ chronic R HF/ cor pulmonale - per CCM Hyperkalemia - has been refusing lokelma . Would not use temporizing measures unless K+ > 6.0. Use renal diet (lowK+) if pt is to be eating.  Chronic hypotension - on midodrine   AMS - suspect this is in part at least due to uremia             Myer Fret MD  [facility] 03/31/2023, 4:36 PM  Recent Labs  Lab 03/27/23 1622 03/27/23 2215 03/30/23 0310 03/31/23 0324  HGB 13.2   < > 12.1 13.1  ALBUMIN  3.6  --   --   --   CALCIUM  8.6*   < > 8.9 9.3  CREATININE 6.12*   < > 6.50* 5.85*  K 6.2*   < > 5.8* 5.4*   < > = values in this interval not displayed.   No results for input(s): IRON, TIBC, FERRITIN in the last 168 hours. Inpatient medications:  Chlorhexidine  Gluconate Cloth  6 each Topical Daily   heparin  injection (subcutaneous)  5,000 Units Subcutaneous Q8H   insulin  aspart  3-9 Units Subcutaneous TID AC & HS   lidocaine   1 patch Transdermal Q24H   midodrine   10 mg Oral TID WC   mupirocin  ointment  1 Application Nasal BID   mouth rinse  15 mL Mouth Rinse 4 times per day   sodium zirconium cyclosilicate   10 g Oral BID    acetaminophen , camphor-menthol , docusate sodium , lip balm, nystatin , polyethylene glycol

## 2023-03-31 NOTE — Progress Notes (Signed)
 The patient had a 11 beat run of Vtach at 1009. Early Glisson with CCM notified.

## 2023-03-31 NOTE — Progress Notes (Addendum)
 NAME:  [name] [name], MRN:  [id number], DOB:  11-23-1955, LOS: 4 ADMISSION DATE:  03/27/2023, CONSULTATION DATE:  2/3 REFERRING MD:  [name] [name], CHIEF COMPLAINT:  respiratory failure   History of Present Illness:  [name] is a 68 y/o woman with a history of OHS, DM, HTN who presented by EMS after family found her with lower saturations after removing her bipap machine. She was saturating 73% when she was found by her aid. She was brought to the ED for confusion and has been on bipap. Per her sister [name] [name], she has been pulling off her bipap while she is asleep- they have found her without it a few times. Her sister has been encouraging her to wear it and leave it on overnight. She complaining of worse neuropathic pain yesterday than her baseline. Per her sister, she called EMS about this but refused hospital transport last night.  She has not had sick contacts.  Her sister is concerned that she has had poor p.o. intake recently and has not been doing that she needs to do at home despite encouragement.  She was on gabapentin  until her admission in December.  During that admission she had acute kidney injury with renal failure that improved with diuresis.  In the emergency department she was found to have respiratory acidosis and started on BiPAP.  Started on norepinephrine  just before my exam due to hypotension.  Pertinent  Medical History  OHS Chronic respiratory failure on 4L O2, nocturnal BiPAP Morbid obesity CKD HTN DM Peripheral neuropathy  Significant Hospital Events: Including procedures, antibiotic start and stop dates in addition to other pertinent events   2/3 admission- bipap, NE, empiric antibiotics  Interim History / Subjective:  Tolerating BiPAP Not engaged Notes report talkative when son was here Objective   Blood pressure (!) 137/54, pulse 73, temperature 97.9 F (36.6 C), resp. rate 14, height 5' 2.5 (1.588 m), weight 123.5 kg, SpO2 96%.    FiO2 (%):  [40 %] 40 %    Intake/Output Summary (Last 24 hours) at 03/31/2023 0832 Last data filed at 03/31/2023 0301 Gross per 24 hour  Intake 145.08 ml  Output 900 ml  Net -754.92 ml   Filed Weights   03/29/23 0526 03/30/23 0500 03/30/23 1512  Weight: 122.5 kg 123.5 kg 123.5 kg    Physical Exam: General: Morbidly obese-appearing, drowsy HENT: [name] [name], AT, BiPAP in place Eyes: EOMI, no scleral icterus Respiratory: Diminished to auscultation bilaterally.  No crackles, wheezing or rales Cardiovascular: RRR, -M/R/G, no JVD GI: BS+, soft, nontender Extremities:-Edema,-tenderness Neuro: Drowsy, CNII-XII grossly intact  Resolved Hospital Problem list   shock  Assessment & Plan:  Chronic cor pulmonale, HFpEF High probability pulmonary embolism, submassive criteria - ruled out on V/Q scan Troponin elevation - chronic RV incompetence but this has acutely worsened since last scan with mcconnell's sign. High concern clinically for pulmonary embolism - since she is not able to get CTPE study due to elevated Cr, continue empiric heparin  gtt. Lower extremity dopplers negative and V/Q scan ordered.  - V/Q scan neg for PE. Heparin  gtt discontinued - Severe RV failure is likely chronic multifactorial in setting of OSA/OHS, chronic hypoxemia, and morbid obesity - Palliative consulted. With her bed bound status, chronic RV failure and not a candidate for dialysis, will need discussion of GOC  Hyperkalemia AKI on CKD IIIB - refusing lokelma . Will continue to offer. On renal diet with low potassium - no immediate need for RRT- appreciate nephrology input. Suspect she is reaching  a new plateau and would not be a good candidate for long term RRT.   Acute on chronic respiratory failure with hypoxia and hypercapnia due to likely BiPAP noncompliance Baseline OSA and OHS - back to baseline - Continue BiPAP at bedtime with nasal cannula during the day 5L baseline. - cultures and procal negative. Stopped all abx today.   Chronic  hypotension likely due to cardiorenal syndrome - Continue home midodrine   Acute encephalopathy - unclear chronicity, trying to get collateral from family - stopping lyrica  due to CKD.   Diabetes with uncontrolled hyperglycemia; A1c 9.2 in 12/1022 - Resistant sliding scale every 4 until eating -goal BG 140-180   Best Practice (right click and Reselect all SmartList Selections daily)   Diet/type: Regular consistency (see orders) DVT prophylaxis systemic heparin  Pressure ulcer(s): none on admission GI prophylaxis: N/A Lines: N/A Foley:  Yes, and it is still needed Code Status:  full code Last date of multidisciplinary goals of care discussion [Updated sister [name] [name] and [name] [name] at bedside. Advised them that [name] [name] has congestive heart failure, renal failure, and chronic respiratory failure and that any one of these problems could be terminal, but for her is particularly devastating. [name] [name] is hopeful and prayerful to treat the treatable and is not ready to confront [name] [name]'s mortality. She feels her son [name] [name] needs to be involved in conversations and is agreeable to family meeting. I have expressed that she is not going to tolerate long term dialysis due to the heart failure and that my concern is the end may be sooner rather than later. ]  Ready to transfer to [facility]. Pick up 2/8  Care Time: 50 min  [name] [name], M.D. [name] [name] Pulmonary/Critical Care Medicine 03/31/2023 8:32 AM   See Amion for personal pager For hours between 7 PM to 7 AM, please call [name] [name] for urgent questions

## 2023-03-31 NOTE — Consult Note (Signed)
 Palliative Care Consult Note                                  Date: 03/31/2023   Patient Name: [name] [name]  DOB: 08-07-1955  MRN: [id number]  Age / Sex: 68 y.o., female  PCP[facility] Referring Physician: Kassie Acquanetta Bradley, MD  Reason for Consultation: Establishing goals of care  HPI/Patient Profile: 68 y.o. female  with past medical history of OHS, DM, HTN who presented by EMS after family found her with lower saturations after removing her bipap machine. She was  admitted on 03/27/2023 with chronic/severe cor pulmonale, hyper ability pulmonary embolism (eventually ruled out), AKI on CKD 3B with associated hyperkalemia, acute on chronic respiratory failure with hypoxia and hypercapnia due to BiPAP noncompliance with baseline OSA and OHS, chronic hypotension due to cardiorenal syndrome, acute encephalopathy, and others.   Palliative medicine was consulted for GOC conversations.  Of note we have seen the patient for the past 2 admissions as well most recently December 2024.  Past Medical History:  Diagnosis Date  . Diabetes mellitus without complication (HCC)   . Hypertension   . Knee pain, chronic   . Neuropathy in diabetes (HCC)     Subjective:   This NP Camellia Kays reviewed medical records, received report from team, assessed the patient and then meet at the patient's bedside to discuss diagnosis, prognosis, GOC, EOL wishes disposition and options.  I met with the patient at the bedside, although she is quite somnolent and not making eye contact or responding to questions.  I attempted to call her son [name] [name]/HCPOA and left a HIPAA appropriate voicemail for call back.  I called her sister [name] [name] and scheduled a time for call back at 1230 this afternoon.  I was able to speak to the patient's sister [name] [name] around 12:00 this afternoon.   We meet to discuss diagnosis prognosis, GOC, EOL wishes, disposition and options. Concept of  Palliative Care was introduced as specialized medical care for people and their families living with serious illness.  If focuses on providing relief from the symptoms and stress of a serious illness.  The goal is to improve quality of life for both the patient and the family. Values and goals of care important to patient and family were attempted to be elicited.  Created space and opportunity for patient  and family to explore thoughts and feelings regarding current medical situation   Natural trajectory and current clinical status were discussed. Questions and concerns addressed. Patient  encouraged to call with questions or concerns.    Patient/Family Understanding of Illness: As he understands that she spoke with PCCM yesterday and she has a lot of problems.  She has heart failure and she is not a candidate for dialysis.  She may have a blood clot and she is not sure about the results of the testing.  They are continuing to treat her with the blood thinner because of the possible blood clot.  She also knows her kidneys are bad and she is not urinating.  She knows that she has chronic neuropathy and this causes her a lot of discomfort.  She states that she was told they are not sure the course but if she goes home at some point she will be back to the hospital given how severe her chronic illnesses are.  She also notes she is not eating or drinking.  We  spent extensive time discussing details about her chronic situation and the interplay between her multiple chronic comorbidities.  Life Review: Unable to perform with the patient.  Copied from previous palliative note: Arneta is from [place]  city originally. She shares that she is lived in [place] for which she thinks is roughly 50 years now. She is not married. [name] [name] shares that she has 1 son, [name] [name], 4 grandchildren, and 4 great-grandchildren. She shares with me that she used to love preparing food for parties-cooking is one of her  passions. She formally worked at [employer]. She is a woman of faith and practices within the [employer] denomination.   Goals: Discussions ongoing.  Previous advance directive available in ACP tab/Vynca states desire for no life prolonging measures if terminal and likely to die soon, comatose and unlikely to regain consciousness, or advanced dementia/mental status change unlikely to recover.  However, she is amendable to artificial nutrition and hydration in the situations.  Today's Discussion: In addition to discussions described above we had extensive discussion on various topics.  Prior to entering the room I spoke with the patient's nurse.  He states that today she is refusing to eat and drink, is very confused.  She continues to refuse her medications including Lokelma .  There is concern for hyperkalemia and occasional arrhythmia including a recent 11 beat run of V. tach while I was present, likely due to hyperkalemia.  Additionally while trying to reach family I spoke with the PCCM provider Lyle, NP.  Discussed that any of these situations are irreversible, and commendation they are irreversible and likely terminal sooner than later.  I described I will continue to attempt to reach out to family and have an open and honest discussion to help chart path forward.  However, the patient's previously expressed desire for full code and full scope.  Conversation with Dr. Kassie with PCCM indicates family is agreeable to meeting for discussion.  When I spoke with sister [name] [name] later in today we had extensive discussion about her chronic health issues.  I discussed the risks of hyperkalemia, the causes including kidney failure, the risk of fatal arrhythmias.  I shared that this is severely complicated by the fact the patient is not eating, drinking, or taking medications.  I discussed that we need to get together as soon as possible, including with her son [name] [name]/HCPOA, to decide  how we want to proceed.  If they want to continue aggressive care we would likely need to put a tube of her nose and into her stomach to give her her medications whether she wants to take them or not.  We also need to discuss CODE STATUS given her very high risk for severe decompensation.  ST states that she will reach out to [place] to see about scheduling a meeting.  I mentioned the sooner the better and offered that I am available between 9 AM and 3:30 PM tomorrow.  If tomorrow would not work based on religious preferences [facility], observe the [place]) then we could try to schedule a meeting with a colleague on Sunday.  She shares that she will call the palliative phone back (phone number was provided) and leave a message with date/time.  I provided emotional and general support through therapeutic listening, empathy, sharing of stories, and other techniques. I answered all questions and addressed all concerns to the best of my ability.  Review of Systems  Unable to perform ROS: Mental status change    Objective:  Primary Diagnoses: Present on Admission: . Acute on chronic respiratory failure with hypoxia and hypercapnia (HCC)   Physical Exam Vitals and nursing note reviewed.  Constitutional:      General: She is sleeping. She is not in acute distress.    Appearance: She is obese. She is ill-appearing.  Cardiovascular:     Rate and Rhythm: Normal rate.     Comments: Noted intermittent arrhythmias including 11 beat run of V. tach Pulmonary:     Effort: Pulmonary effort is normal. No respiratory distress.     Breath sounds: Normal breath sounds.  Neurological:     Mental Status: She is lethargic.     Comments: Not interactive/communicative     Vital Signs:  BP (!) 157/65   Pulse 79   Temp 98.2 F (36.8 C)   Resp 13   Ht 5' 2.5 (1.588 m)   Wt 123.5 kg   SpO2 95%   BMI 49.00 kg/m   Palliative Assessment/Data: 10%    Advanced Care Planning:   Existing  Vynca/ACP Documentation: Advance directive signed 02/20/2023  Primary Decision Maker: NEXT OF KIN  Code Status/Advance Care Planning: Full code  A discussion was had today regarding advanced directives. Concepts specific to code status, artifical feeding and hydration, continued IV antibiotics and rehospitalization was had.  The difference between a aggressive medical intervention path and a palliative comfort care path for this patient at this time was had.   Decisions/Changes to ACP: None today  Assessment & Plan:   Impression: 68 year old female with acute presentation chronic comorbidities as described above.  She is in quite a predicament with severe/end-stage RV failure and cor pulmonale, AKI on CKD, chronic respiratory failure, acute encephalopathy.  AKI is likely secondary to hypoperfusion from cardiorenal syndrome.  She has very high potassium and refusing medications including Lokelma .  Creatinine has worsened except today had a mild improvement from creatinine 6.5-5.85, although EGFR only improved from 6-7.  Will await callback/message with day/time for meeting.  Hopeful to meet tomorrow some time, possibly Sunday pending religious beliefs.  Palliative medicine will continue to follow.  SUMMARY OF RECOMMENDATIONS   Full code, full scope of care Hopeful for meeting tomorrow to clarify goals/wishes Family understands difficult decisions ahead and severity of illness Palliative medicine will continue to follow  Symptom Management:  Per primary team PMT is available to assist as needed  Prognosis:  Unable to determine  Discharge Planning:  To Be Determined   Discussed with: Patient's family, medical team, nursing team    Thank you for allowing us  to participate in the care of ROSAMARIA DONN PMT will continue to support holistically.  Time Total: 111 min (morning and afternoon)  Detailed review of medical records (labs, imaging, vital signs), medically  appropriate exam, discussed with treatment team, counseling and education to patient, family, & staff, documenting clinical information, medication management, coordination of care  Signed by: Camellia Kays, NP Palliative Medicine Team  Team Phone # [phone] (Nights/Weekends)  03/31/2023, 10:55 AM

## 2023-04-01 ENCOUNTER — Inpatient Hospital Stay (HOSPITAL_COMMUNITY): Payer: No Typology Code available for payment source

## 2023-04-01 DIAGNOSIS — I5032 Chronic diastolic (congestive) heart failure: Secondary | ICD-10-CM | POA: Diagnosis not present

## 2023-04-01 DIAGNOSIS — I50811 Acute right heart failure: Secondary | ICD-10-CM

## 2023-04-01 DIAGNOSIS — Z515 Encounter for palliative care: Secondary | ICD-10-CM | POA: Diagnosis not present

## 2023-04-01 DIAGNOSIS — Z7189 Other specified counseling: Secondary | ICD-10-CM | POA: Diagnosis not present

## 2023-04-01 DIAGNOSIS — J9622 Acute and chronic respiratory failure with hypercapnia: Secondary | ICD-10-CM | POA: Diagnosis not present

## 2023-04-01 DIAGNOSIS — E662 Morbid (severe) obesity with alveolar hypoventilation: Secondary | ICD-10-CM | POA: Insufficient documentation

## 2023-04-01 DIAGNOSIS — G9341 Metabolic encephalopathy: Secondary | ICD-10-CM

## 2023-04-01 DIAGNOSIS — J9621 Acute and chronic respiratory failure with hypoxia: Secondary | ICD-10-CM | POA: Diagnosis not present

## 2023-04-01 DIAGNOSIS — G4733 Obstructive sleep apnea (adult) (pediatric): Secondary | ICD-10-CM

## 2023-04-01 DIAGNOSIS — R570 Cardiogenic shock: Secondary | ICD-10-CM

## 2023-04-01 LAB — BASIC METABOLIC PANEL
Anion gap: 14 (ref 5–15)
BUN: 66 mg/dL — ABNORMAL HIGH (ref 8–23)
CO2: 23 mmol/L (ref 22–32)
Calcium: 9.2 mg/dL (ref 8.9–10.3)
Chloride: 105 mmol/L (ref 98–111)
Creatinine, Ser: 5.37 mg/dL — ABNORMAL HIGH (ref 0.44–1.00)
GFR, Estimated: 8 mL/min — ABNORMAL LOW (ref >60)
Glucose, Bld: 72 mg/dL (ref 70–99)
Potassium: 5.2 mmol/L — ABNORMAL HIGH (ref 3.5–5.1)
Sodium: 142 mmol/L (ref 135–145)

## 2023-04-01 LAB — MAGNESIUM: Magnesium: 1.8 mg/dL (ref 1.7–2.4)

## 2023-04-01 LAB — CBC
HCT: 43.8 % (ref 36.0–46.0)
Hemoglobin: 12.3 g/dL (ref 12.0–15.0)
MCH: 23.8 pg — ABNORMAL LOW (ref 26.0–34.0)
MCHC: 28.1 g/dL — ABNORMAL LOW (ref 30.0–36.0)
MCV: 84.9 fL (ref 80.0–100.0)
Platelets: 281 10*3/uL (ref 150–400)
RBC: 5.16 MIL/uL — ABNORMAL HIGH (ref 3.87–5.11)
RDW: 21.3 % — ABNORMAL HIGH (ref 11.5–15.5)
WBC: 5.1 10*3/uL (ref 4.0–10.5)
nRBC: 0.4 % — ABNORMAL HIGH (ref 0.0–0.2)

## 2023-04-01 LAB — GLUCOSE, CAPILLARY
Glucose-Capillary: 78 mg/dL (ref 70–99)
Glucose-Capillary: 82 mg/dL (ref 70–99)
Glucose-Capillary: 83 mg/dL (ref 70–99)
Glucose-Capillary: 98 mg/dL (ref 70–99)

## 2023-04-01 LAB — HEPARIN LEVEL (UNFRACTIONATED): Heparin Unfractionated: <0.1 [IU]/mL — ABNORMAL LOW (ref 0.30–0.70)

## 2023-04-01 MED ORDER — OSMOLITE 1.5 CAL PO LIQD
1000.0000 mL | ORAL | Status: DC
  Administered 2023-04-02: 1000 mL
  Filled 2023-04-01 (×2): qty 1000

## 2023-04-01 MED ORDER — FREE WATER
100.0000 mL | Status: DC
  Administered 2023-04-02 – 2023-04-03 (×8): 100 mL

## 2023-04-01 NOTE — Progress Notes (Signed)
   04/01/23 2358  BiPAP/CPAP/SIPAP  Reason BIPAP/CPAP not in use Other(comment) (pt has blue tubes coming out of her nose)  BiPAP/CPAP /SiPAP Vitals  Resp 15  MEWS Score/Color  MEWS Score 2  MEWS Score Color Yellow

## 2023-04-01 NOTE — Assessment & Plan Note (Addendum)
 Acute renal failure due to cardiorenal syndrome Urine output 1 L in the last day, weight down 3 kg since admission. Baseline creatinine 1.5-2.1, here peaked at 6.5, trending down to 5.3 today -Hold diuretics - Continue midodrine  - Lokelma  per nephrology

## 2023-04-01 NOTE — Progress Notes (Signed)
  Progress Note   Patient: [name] [name] FMW:[id number] DOB: 05/01/1955 DOA: 03/27/2023     5 DOS: the patient was seen and examined on 04/01/2023 at 9:20 AM and 1:15 PM      Brief hospital course: 68 y.o. F with MO, OHS, OSA nonadherent with nocturnal BiPAP, CRF nonadherent with home O2 4L, dCHF, severe RHF, HTN, DM and neuropathy who presented with somnolence, found to have hypercarbic respiratory failure and cardiogenic shock.     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  Nephrology recommending to hold diuretics at this time.  She is not a candidate for advanced cardiac treatments.    Echo shows preserved EF, severe RH dilation and severe RVSD.  Echo back to 08/2021 shows severe RV dysfunction due to obesity hypoventilation. - Continue midodrine  - Hold home torsemide     Acute renal failure superimposed on stage 3b chronic kidney disease (HCC) Acute renal failure due to cardiorenal syndrome Urine output 1 L in the last day, weight down 3 kg since admission. Baseline creatinine 1.5-2.1, here peaked at 6.5, trending down to 5.3 today -Hold diuretics - Continue midodrine  - Lokelma  per nephrology  Type 2 diabetes mellitus with hyperglycemia, with long-term current use of insulin  (HCC) Glucose low normal - Continue sliding scale corrections as needed - Hold Ozempic  OSA (obstructive sleep apnea) - BiPAP at night  Morbid obesity (HCC) BMI 46.9  Chronic hypotension - Continue midodrine   Anxiety and depression - Resume mirtazapine  and sertraline  when more alert  Hyperlipidemia associated with type 2 diabetes mellitus (HCC) - Hold Lipitor  Acute metabolic encephalopathy At baseline the patient is oriented and interactive.  Here she was  somnolent and unresponsive initially, is not making eye contact, but does not follow commands consistently -Place core track, start tube feeds         Subjective: Patient is able to make eye contact today, but still densely encephalopathic.  No respiratory distress, no fever.     Physical Exam: BP (!) 161/71 (BP Location: Left Wrist) Comment: RN notified in room  Pulse 77   Temp 99.1 F (37.3 C) (Oral)   Resp 13   Ht 5' 2.5 (1.588 m)   Wt 118.4 kg   SpO2 96%   BMI 46.98 kg/m   Obese adult female, lying in bed, makes eye contact, but does not respond verbally RRR, heart sounds distant, no pitting in the extremities, volume status not possible to assess due to body habitus Respiratory rate seems normal, lung sounds diminished, no rales or wheezes appreciated, lower body exam limited by habitus No tenderness to palpation of the abdomen She makes eye contact, and looks at me when I speak, but does not follow commands, does not make any verbalizations     Data Reviewed: Discussion with palliative care and nephrology Basic metabolic panel shows creatinine down to 5.3 Leg ultrasound shows no DVT VQ scan negative CBC unremarkable Glucose normal   Family Communication: Son, sister, brother-in-law    Disposition: Status is: Inpatient         Author: Lonni SHAUNNA Dalton, MD 04/01/2023 2:27 PM  For on call review [email or web address].

## 2023-04-01 NOTE — Assessment & Plan Note (Addendum)
 Continue tube feeds.

## 2023-04-01 NOTE — Progress Notes (Signed)
 Daily Progress Note   Patient Name: [name] [name]       Date: 04/01/2023 DOB: 02-Jul-1955  Age: 68 y.o. MRN#: [id number] Attending Physician: Jonel Lonni SQUIBB, * Primary Care Physician: [facility] Admit Date: 03/27/2023 Length of Stay: 5 days  Reason for Consultation/Follow-up: Establishing goals of care  HPI/Patient Profile:  68 y.o. female  with past medical history of OHS, DM, HTN who presented by EMS after family found her with lower saturations after removing her bipap machine. She was  admitted on 03/27/2023 with chronic/severe cor pulmonale, hyper ability pulmonary embolism (eventually ruled out), AKI on CKD 3B with associated hyperkalemia, acute on chronic respiratory failure with hypoxia and hypercapnia due to BiPAP noncompliance with baseline OSA and OHS, chronic hypotension due to cardiorenal syndrome, acute encephalopathy, and others.    Palliative medicine was consulted for GOC conversations.  Of note we have seen the patient for the past 2 admissions as well most recently December 2024.  Subjective:   Subjective: Chart Reviewed. Updates received. Patient Assessed. Created space and opportunity for patient  and family to explore thoughts and feelings regarding current medical situation.  Today's Discussion: Prior to seeing the patient today I called her son back/return to message and agreed to family meeting at 1:00 today.  I notified Dr. Bensimhon indicated he would be in attendance.    Today saw the patient at the bedside, a cousin was present and other family members were en route.  The patient is much more alert today, although not answering questions and not interactive.  She does not appear to be in pain or distress.  When family arrived around 1:00 Dr. Jonel, myself, and family went to the conference room for a family meeting.  Present was son [name] [name], Sister [name] [name], [name] [name]'s wife, [name] [name]'s husband, Sister [name] [name], and patient's cousin.   We had a very good and in-depth conversation.  We explained clinical details, discussed the patient, addressed things of importance, and set most important goals.  We provided realistic expectations for her short-term and long-term prognosis.  In essence the family understands that currently we are taking a day by day, have noted some mild improvements in kidney function and awareness.  However they understand in the long run there is nothing we can do to fix her heart and this will continue to be a problem and she will suffer exacerbations that will lead to the end of her life.  Family's primary goal is for the patient to be more awake and be able to communicate with them.  Their hope is that she will be able to communicate her wishes for how to proceed with care.  This would also allow them to spend quality time with them.  Identified things of importance include faith and hope.  Family has a very strong faith and shared their belief that God can fix this.  However, they do understand that the miracle we ask for is not always a miracle we get.  I discussed continuing to hope and pray but making plans for how her weekend to proceed with her care if we do not get the miracle we are wanting.  Delon conversation we agreed to placing a core track tube for fluids, medications, and nutrition.  We agree that we need time for outcomes to continue to closely follow her kidney function and mental status to see how she does over the coming days.  They understand that if she does not improve this could be  more suggestive of a poor short-term prognosis as well as a poor long-term prognosis.  We agreed to meet again next week after a few days of time for outcomes to discuss how to proceed depending on her response to ongoing interventions.  I provided emotional and general support through therapeutic listening, empathy, sharing of stories, therapeutic touch, and other techniques. I answered all questions and  addressed all concerns to the best of my ability.  Review of Systems  Unable to perform ROS   Objective:   Vital Signs:  BP (!) 161/71 (BP Location: Left Wrist) Comment: RN notified in room  Pulse 77   Temp 99.1 F (37.3 C) (Oral)   Resp 13   Ht 5' 2.5 (1.588 m)   Wt 118.4 kg   SpO2 96%   BMI 46.98 kg/m   Physical Exam Vitals and nursing note reviewed.  Constitutional:      General: She is not in acute distress.    Appearance: She is obese. She is ill-appearing.     Comments: Non-communicative  HENT:     Head: Normocephalic and atraumatic.  Cardiovascular:     Rate and Rhythm: Normal rate.  Pulmonary:     Effort: Pulmonary effort is normal. No respiratory distress.  Skin:    General: Skin is warm and dry.  Neurological:     Mental Status: She is alert.     Comments: Alert but not following commands or answering questions     Palliative Assessment/Data: 10-20%    Existing Vynca/ACP Documentation: Advance directive signed 02/20/2023   Assessment & Plan:   Impression: Present on Admission: . Acute on chronic respiratory failure with hypoxia and hypercapnia (HCC) . Acute renal failure superimposed on stage 3b chronic kidney disease (HCC) . Hyperkalemia . (HFpEF) heart failure with preserved ejection fraction (HCC) . Morbid obesity (HCC) . OSA (obstructive sleep apnea) . Acute metabolic encephalopathy . Chronic hypotension . Hyperlipidemia associated with type 2 diabetes mellitus (HCC) . Anxiety and depression  68 year old female with acute presentation chronic comorbidities as described above.  She is in quite a predicament with severe/end-stage RV failure and cor pulmonale, AKI on CKD, chronic respiratory failure, acute encephalopathy.  AKI is likely secondary to hypoperfusion from cardiorenal syndrome.  She has very high potassium and refusing medications including Lokelma .  Creatinine has worsened except today had a mild improvement from creatinine 6.5.   After initial family meeting today we have agreed to [facility] placement, time for outcomes.  Family understands need to meet in the future to regroup and discuss how to proceed pending evolution of her clinical picture.  Palliative medicine will continue to follow.  SUMMARY OF RECOMMENDATIONS   Full code Full scope of care Anticipate CorTrak placement today Fluids, medications, nutrition through feeding tube Time for outcomes Anticipate next palliative medicine follow-up in 2 to 3 days Please call us  for any significant clinical change or new palliative needs in the interim  Symptom Management:  Primary team PMT is available to assist as needed  Code Status: Full code  Prognosis: Unable to determine  Discharge Planning: To Be Determined  Discussed with: Patient's family, medical team, nursing team  Thank you for allowing us  to participate in the care of DONELLE HISE PMT will continue to support holistically.  Time Total: 85 min  Detailed review of medical records (labs, imaging, vital signs), medically appropriate exam, discussed with treatment team, counseling and education to patient, family, & staff, documenting clinical information, medication management, coordination of care  Camellia Kays, NP Palliative Medicine Team  Team Phone # [phone] (Nights/Weekends)  10/20/2020, 8:17 AM

## 2023-04-01 NOTE — Assessment & Plan Note (Addendum)
 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  Nephrology recommending to hold diuretics at this time.  She is not a candidate for advanced cardiac treatments.    Echo shows preserved EF, severe RH dilation and severe RVSD.  Echo back to 08/2021 shows severe RV dysfunction due to obesity hypoventilation.  O2 needs stable - Continue midodrine  - Hold home torsemide  - Continue home O2 - Cntinue BiPAP at night

## 2023-04-01 NOTE — Assessment & Plan Note (Signed)
 BiPAP at night

## 2023-04-01 NOTE — Assessment & Plan Note (Signed)
 Glucose low normal - Continue sliding scale corrections as needed - Hold Ozempic

## 2023-04-01 NOTE — Hospital Course (Signed)
 68 y.o. F with MO, OHS, OSA nonadherent with nocturnal BiPAP, CRF nonadherent with home O2 4L, dCHF, severe RHF, HTN, DM and neuropathy who presented with somnolence, found to have hypercarbic respiratory failure and cardiogenic shock.

## 2023-04-01 NOTE — Assessment & Plan Note (Signed)
 -  Hold Lipitor

## 2023-04-01 NOTE — Assessment & Plan Note (Signed)
-   Resume mirtazapine  and sertraline when more alert

## 2023-04-01 NOTE — Assessment & Plan Note (Signed)
 BMI 46.9

## 2023-04-01 NOTE — Assessment & Plan Note (Signed)
 Continue midodrine

## 2023-04-01 NOTE — Progress Notes (Signed)
 [facility] Progress Note  Subjective: creat down today to 5.37. UOP 1.2 L yesterday. K+ better at 5.2. BP's stable. Pt is more alert today. I/O's after 4 days are about equal.   Vitals:   04/01/23 0302 04/01/23 0600 04/01/23 0936 04/01/23 1156  BP:  130/70 (!) 160/68   Pulse:  82 81   Resp: 15  13   Temp:  98.5 F (36.9 C) 98.6 F (37 C)   TempSrc:  Axillary Oral   SpO2:  99% 100% 96%  Weight:      Height:        Exam: Gen less somnolent today, opens eyes to voice, said hello Morbid obesity Sclera anicteric, throat clear  No jvd or bruits Chest clear bilat ant and lateral RRR no RG Abd soft obese ntnd no mass or ascites +bs GU defer MS no joint effusions or deformity Ext no pitting LE or UE edema, no other edema Neuro is as above       Renal-related home meds: - klor-con  20 every day - torsemide  40 bid - midodrine  5 tid   Date                             Creat               eGFR (ml/min) 2016                            12.5 >> 1.69    AKI 2018                            1.97 >> 1.65 2020                            1.59- 1.88 2021                            0.83- 7.15 2022                            1.25- 3.31 2023                            0.97- 3.63 Aug 2024  1.52- 1.98 27- 37 ml/min Nov 2024  2.96 >> 1.92 17 >> 28 ml/min 02/15/23- 02/23/23 5.55 >> 2.13 8 >> 25 ml/min 03/27/23                        6.12, 5.21 03/28/23                        6.15 03/29/23                        6.40 03/30/23                        6.50   Total I/O = 2.8 L in and 1.7 L out = +1.2L  CXR #1 2/03 - no edema/ congestion  CXR #2 2/05 - showed new vasc congestion UA 2/3 - hazy, small LE, neg prot, 0-5 wbc/ rbc/ epis CT abd no contrast 2/03 -  bilat kidneys w/o obstruction, in nl position, no stones or masses  Today labs --> Na 136  K 5.u8  CO2 24  BUN 68  creat 6.50    Hb 12   Assessment/ Plan: AKI on CKD 3b - b/l creatinine is 1.5- 2.1 from aug 2024 to jan 2025,  eGFR 25- 37 ml/min. Creat here was 6.1 on admission and 6.5 at time of consult, in the setting of severe R heart failure/ hypoperfusion. UA was negative, urine lytes are c/w pre-renal. CT abdomen shows 2 kidneys w/o obstruction. Suspect AKI is d/t cardiorenal syndrome related to RV failure. No vol overload on exam. Pt is making urine. Due to debility/ immobility and severe comorbidities pt is not a candidate for dialysis. Creat improving last 48hrs and pt more responsive prob indicating an improvement in uremic symptoms. Not eating yet, will start low-dose IVF's at 50 cc/hr.  Acute/ chronic R HF/ cor pulmonale - per CCM Hyperkalemia - improving, K+ 5.2 today.  Cont renal diet (lowK+) and lokelma  when needed.  Chronic hypotension - on midodrine  10 tid, may be able to lower this.                Myer Fret MD  [facility] 04/01/2023, 12:22 PM  Recent Labs  Lab 03/27/23 1622 03/27/23 2215 03/31/23 0324 04/01/23 0601  HGB 13.2   < > 13.1 12.3  ALBUMIN  3.6  --   --   --   CALCIUM  8.6*   < > 9.3 9.2  CREATININE 6.12*   < > 5.85* 5.37*  K 6.2*   < > 5.4* 5.2*   < > = values in this interval not displayed.   No results for input(s): IRON, TIBC, FERRITIN in the last 168 hours. Inpatient medications:  Chlorhexidine  Gluconate Cloth  6 each Topical Daily   heparin  injection (subcutaneous)  5,000 Units Subcutaneous Q8H   insulin  aspart  3-9 Units Subcutaneous TID AC & HS   lidocaine   1 patch Transdermal Q24H   midodrine   10 mg Oral TID WC   mupirocin  ointment  1 Application Nasal BID   mouth rinse  15 mL Mouth Rinse 4 times per day   sodium zirconium cyclosilicate   10 g Oral BID    acetaminophen , camphor-menthol , docusate sodium , lip balm, nystatin , polyethylene glycol

## 2023-04-02 ENCOUNTER — Inpatient Hospital Stay (HOSPITAL_COMMUNITY): Payer: No Typology Code available for payment source

## 2023-04-02 DIAGNOSIS — R531 Weakness: Secondary | ICD-10-CM | POA: Diagnosis not present

## 2023-04-02 DIAGNOSIS — Z515 Encounter for palliative care: Secondary | ICD-10-CM | POA: Diagnosis not present

## 2023-04-02 DIAGNOSIS — Z7189 Other specified counseling: Secondary | ICD-10-CM

## 2023-04-02 DIAGNOSIS — J9622 Acute and chronic respiratory failure with hypercapnia: Secondary | ICD-10-CM | POA: Diagnosis not present

## 2023-04-02 DIAGNOSIS — J9621 Acute and chronic respiratory failure with hypoxia: Secondary | ICD-10-CM | POA: Diagnosis not present

## 2023-04-02 LAB — BASIC METABOLIC PANEL
Anion gap: 16 — ABNORMAL HIGH (ref 5–15)
BUN: 64 mg/dL — ABNORMAL HIGH (ref 8–23)
CO2: 21 mmol/L — ABNORMAL LOW (ref 22–32)
Calcium: 9.5 mg/dL (ref 8.9–10.3)
Chloride: 107 mmol/L (ref 98–111)
Creatinine, Ser: 4.35 mg/dL — ABNORMAL HIGH (ref 0.44–1.00)
GFR, Estimated: 10 mL/min — ABNORMAL LOW (ref >60)
Glucose, Bld: 104 mg/dL — ABNORMAL HIGH (ref 70–99)
Potassium: 5.5 mmol/L — ABNORMAL HIGH (ref 3.5–5.1)
Sodium: 144 mmol/L (ref 135–145)

## 2023-04-02 LAB — CULTURE, BLOOD (ROUTINE X 2)
Culture: NO GROWTH
Culture: NO GROWTH

## 2023-04-02 LAB — CBC
HCT: 44.9 % (ref 36.0–46.0)
Hemoglobin: 12.5 g/dL (ref 12.0–15.0)
MCH: 23.2 pg — ABNORMAL LOW (ref 26.0–34.0)
MCHC: 27.8 g/dL — ABNORMAL LOW (ref 30.0–36.0)
MCV: 83.5 fL (ref 80.0–100.0)
Platelets: 253 10*3/uL (ref 150–400)
RBC: 5.38 MIL/uL — ABNORMAL HIGH (ref 3.87–5.11)
RDW: 21.5 % — ABNORMAL HIGH (ref 11.5–15.5)
WBC: 5.2 10*3/uL (ref 4.0–10.5)
nRBC: 0 % (ref 0.0–0.2)

## 2023-04-02 LAB — GLUCOSE, CAPILLARY
Glucose-Capillary: 113 mg/dL — ABNORMAL HIGH (ref 70–99)
Glucose-Capillary: 107 mg/dL — ABNORMAL HIGH (ref 70–99)
Glucose-Capillary: 145 mg/dL — ABNORMAL HIGH (ref 70–99)
Glucose-Capillary: 150 mg/dL — ABNORMAL HIGH (ref 70–99)
Glucose-Capillary: 159 mg/dL — ABNORMAL HIGH (ref 70–99)

## 2023-04-02 MED ORDER — SODIUM ZIRCONIUM CYCLOSILICATE 10 G PO PACK
10.0000 g | PACK | Freq: Three times a day (TID) | ORAL | Status: DC
  Administered 2023-04-02 – 2023-04-03 (×4): 10 g
  Filled 2023-04-02 (×3): qty 1

## 2023-04-02 MED ORDER — POLYETHYLENE GLYCOL 3350 17 G PO PACK: 17.0000 g | PACK | Freq: Every day | ORAL | Status: DC | PRN

## 2023-04-02 MED ORDER — INSULIN ASPART 100 UNIT/ML IJ SOLN
0.0000 [IU] | INTRAMUSCULAR | Status: DC
  Administered 2023-04-02: 4 [IU] via SUBCUTANEOUS
  Administered 2023-04-02 (×2): 3 [IU] via SUBCUTANEOUS
  Administered 2023-04-03: 4 [IU] via SUBCUTANEOUS
  Administered 2023-04-03 (×2): 3 [IU] via SUBCUTANEOUS
  Administered 2023-04-03 (×2): 4 [IU] via SUBCUTANEOUS
  Administered 2023-04-03 – 2023-04-05 (×3): 3 [IU] via SUBCUTANEOUS

## 2023-04-02 MED ORDER — MIDODRINE HCL 5 MG PO TABS
10.0000 mg | ORAL_TABLET | Freq: Three times a day (TID) | ORAL | Status: DC
  Administered 2023-04-02 – 2023-04-04 (×5): 10 mg
  Filled 2023-04-02 (×5): qty 2

## 2023-04-02 MED ORDER — DOCUSATE SODIUM 50 MG/5ML PO LIQD: 100.0000 mg | Freq: Two times a day (BID) | ORAL | Status: DC | PRN

## 2023-04-02 MED ORDER — ACETAMINOPHEN 325 MG PO TABS
650.0000 mg | ORAL_TABLET | ORAL | Status: DC | PRN
  Administered 2023-04-04: 650 mg
  Filled 2023-04-02: qty 2

## 2023-04-02 MED ORDER — SODIUM ZIRCONIUM CYCLOSILICATE 10 G PO PACK
10.0000 g | PACK | Freq: Three times a day (TID) | ORAL | Status: DC
  Filled 2023-04-02: qty 1

## 2023-04-02 NOTE — Progress Notes (Signed)
 [facility] Progress Note  Subjective: creat down today to 4.2. more alert again today. 900 cc UOP yesterday.   Vitals:   04/02/23 0000 04/02/23 0539 04/02/23 0707 04/02/23 0900  BP:  (!) 158/67  (!) 168/74  Pulse:  74  73  Resp: 14     Temp:  97.9 F (36.6 C)  98.3 F (36.8 C)  TempSrc:  Oral  Axillary  SpO2:  96%  95%  Weight:   120 kg   Height:        Exam: Gen alert, responding, hard to hear  Morbid obesity Sclera anicteric, throat clear  No jvd or bruits Chest clear bilat ant and lateral RRR no RG Abd soft obese ntnd no mass or ascites +bs GU foley cath in place Ext no pitting LE or UE edema Neuro is as above       Renal-related home meds: - klor-con  20 every day - torsemide  40 bid - midodrine  5 tid   Date                             Creat               eGFR (ml/min) 2016                            12.5 >> 1.69    AKI 2018                            1.97 >> 1.65 2020                            1.59- 1.88 2021                            0.83- 7.15 2022                            1.25- 3.31 2023                            0.97- 3.63 Aug 2024  1.52- 1.98 27- 37 ml/min Nov 2024  2.96 >> 1.92 17 >> 28 ml/min 02/15/23- 02/23/23 5.55 >> 2.13 8 >> 25 ml/min 03/27/23                        6.12, 5.21 03/28/23                        6.15 03/29/23                        6.40 03/30/23                        6.50   Total I/O = 2.8 L in and 1.7 L out = +1.2L  CXR #1 2/03 - no edema/ congestion  CXR #2 2/05 - showed new vasc congestion UA 2/3 - hazy, small LE, neg prot, 0-5 wbc/ rbc/ epis CT abd no contrast 2/03 - bilat kidneys w/o obstruction, in nl position, no stones or masses  Today labs --> Na 136  K 5.u8  CO2 24  BUN 68  creat 6.50    Hb 12   Assessment/ Plan: AKI on CKD 3b - b/l creatinine is 1.5- 2.1 from aug 2024 to jan 2025, eGFR 25- 37 ml/min. Creat here was 6.1 on admission and 6.5 at time of consult, in the setting of severe R heart failure/  hypoperfusion. UA was negative, urine lytes were c/w pre-renal. CT abdomen showed wnl appearing kidneys w/o obstruction. Suspect AKI d/t cardiorenal syndrome related to RV failure. No vol overload on exam. Due to debility/ immobility and severe comorbidities pt is not a candidate for dialysis. Creat continues to improve down to 4.3 today, and pt more responsive prob indicating an improvement in uremia. UOP is good. Getting cortrak tube feeds now.  No new suggestions, will sign off. Will set her up for office f/u.   Acute/ chronic R HF/ cor pulmonale - per CCM Hyperkalemia - improving, K+ 5.2 today.  Cont renal diet (lowK+) and lokelma  when needed.  Chronic hypotension - on midodrine  10 tid              Myer Fret MD  [facility] 04/02/2023, 11:12 AM  Recent Labs  Lab 03/27/23 1622 03/27/23 2215 04/01/23 0601 04/02/23 0552  HGB 13.2   < > 12.3 12.5  ALBUMIN  3.6  --   --   --   CALCIUM  8.6*   < > 9.2 9.5  CREATININE 6.12*   < > 5.37* 4.35*  K 6.2*   < > 5.2* 5.5*   < > = values in this interval not displayed.   No results for input(s): IRON, TIBC, FERRITIN in the last 168 hours. Inpatient medications:  Chlorhexidine  Gluconate Cloth  6 each Topical Daily   free water   100 mL Per Tube Q4H   heparin  injection (subcutaneous)  5,000 Units Subcutaneous Q8H   insulin  aspart  0-20 Units Subcutaneous Q4H   lidocaine   1 patch Transdermal Q24H   midodrine   10 mg Per Tube TID WC   mouth rinse  15 mL Mouth Rinse 4 times per day   sodium zirconium cyclosilicate   10 g Per Tube TID    feeding supplement (OSMOLITE 1.5 CAL) 1,000 mL (04/02/23 0944)   acetaminophen , camphor-menthol , docusate, lip balm, nystatin , polyethylene glycol

## 2023-04-02 NOTE — Plan of Care (Signed)
  Problem: Nutrition: Goal: Adequate nutrition will be maintained Outcome: Progressing   Problem: Elimination: Goal: Will not experience complications related to urinary retention Outcome: Progressing   Problem: Safety: Goal: Ability to remain free from injury will improve Outcome: Progressing   Problem: Skin Integrity: Goal: Risk for impaired skin integrity will decrease Outcome: Progressing   Problem: Education: Goal: Ability to describe self-care measures that may prevent or decrease complications (Diabetes Survival Skills Education) will improve Outcome: Not Progressing Goal: Individualized Educational Video(s) Outcome: Not Progressing   Problem: Coping: Goal: Ability to adjust to condition or change in health will improve Outcome: Not Progressing   Problem: Fluid Volume: Goal: Ability to maintain a balanced intake and output will improve Outcome: Not Progressing   Problem: Health Behavior/Discharge Planning: Goal: Ability to identify and utilize available resources and services will improve Outcome: Not Progressing Goal: Ability to manage health-related needs will improve Outcome: Not Progressing   Problem: Metabolic: Goal: Ability to maintain appropriate glucose levels will improve Outcome: Not Progressing   Problem: Nutritional: Goal: Maintenance of adequate nutrition will improve Outcome: Not Progressing Goal: Progress toward achieving an optimal weight will improve Outcome: Not Progressing   Problem: Skin Integrity: Goal: Risk for impaired skin integrity will decrease Outcome: Not Progressing   Problem: Tissue Perfusion: Goal: Adequacy of tissue perfusion will improve Outcome: Not Progressing   Problem: Education: Goal: Knowledge of General Education information will improve Description: Including pain rating scale, medication(s)/side effects and non-pharmacologic comfort measures Outcome: Not Progressing   Problem: Health Behavior/Discharge  Planning: Goal: Ability to manage health-related needs will improve Outcome: Not Progressing   Problem: Clinical Measurements: Goal: Ability to maintain clinical measurements within normal limits will improve Outcome: Not Progressing Goal: Will remain free from infection Outcome: Not Progressing Goal: Diagnostic test results will improve Outcome: Not Progressing Goal: Respiratory complications will improve Outcome: Not Progressing Goal: Cardiovascular complication will be avoided Outcome: Not Progressing   Problem: Activity: Goal: Risk for activity intolerance will decrease Outcome: Not Progressing   Problem: Coping: Goal: Level of anxiety will decrease Outcome: Not Progressing   Problem: Elimination: Goal: Will not experience complications related to bowel motility Outcome: Not Progressing   Problem: Pain Managment: Goal: General experience of comfort will improve and/or be controlled Outcome: Not Progressing  Pt is able to say limited words. Mostly non verbal and unable to assess

## 2023-04-02 NOTE — Progress Notes (Signed)
 Daily Progress Note   Patient Name: [name] [name]       Date: 04/02/2023 DOB: [birth year]  Age: 68 y.o. MRN#: [id number] Attending Physician: [name] [name], * Primary Care Physician: Clinic, [name] [name] Admit Date: 03/27/2023 Length of Stay: 6 days  Reason for Consultation/Follow-up: Establishing goals of care  HPI/Patient Profile:  68 y.o. female  with past medical history of OHS, DM, HTN who presented by EMS after family found her with lower saturations after removing her bipap machine. She was  admitted on 03/27/2023 with chronic/severe cor pulmonale, hyper ability pulmonary embolism (eventually ruled out), AKI on CKD 3B with associated hyperkalemia, acute on chronic respiratory failure with hypoxia and hypercapnia due to BiPAP noncompliance with baseline OSA and OHS, chronic hypotension due to cardiorenal syndrome, acute encephalopathy, and others.    Palliative medicine was consulted for GOC conversations.  Of note we have seen the patient for the past 2 admissions as well most recently December 2024.  Subjective:   Subjective:   Now has cortrak, tube feeds noted.   Call received from sister Ms [name] [name] asking about a community elder coming in to visit with the patient and to offer her communion. Discussed with sister about patient's current condition and answered all of her questions.    Review of Systems  Unable to perform ROS   Objective:   Vital Signs:  BP (!) 166/73 (BP Location: Left Arm)   Pulse 75   Temp 99.1 F (37.3 C) (Oral)   Resp 14   Ht 5' 2.5 (1.588 m)   Wt 120 kg   SpO2 95%   BMI 47.62 kg/m   Physical Exam Vitals and nursing note reviewed.  Constitutional:      General: She is not in acute distress.    Appearance: She is obese. She is ill-appearing.     Comments: Non-communicative  HENT:     Head: Normocephalic and atraumatic.  Cardiovascular:     Rate and Rhythm: Normal rate.  Pulmonary:     Effort: Pulmonary effort is normal. No  respiratory distress.  Skin:    General: Skin is warm and dry.  Neurological:     Mental Status: She is alert.     Comments: Alert but not following commands or answering questions    Now has cortrak and tube feeds have been initiated.  Palliative Assessment/Data: 10-20%    Existing Vynca/ACP Documentation: Advance directive signed 02/20/2023   Assessment & Plan:   Impression: Present on Admission: . Acute on chronic respiratory failure with hypoxia and hypercapnia (HCC) . Acute renal failure superimposed on stage 3b chronic kidney disease (HCC) . Hyperkalemia . (HFpEF) heart failure with preserved ejection fraction (HCC) . Morbid obesity (HCC) . OSA (obstructive sleep apnea) . Acute metabolic encephalopathy . Chronic hypotension . Hyperlipidemia associated with type 2 diabetes mellitus (HCC) . Anxiety and depression  68 year old female with acute presentation chronic comorbidities as described above.  She is in quite a predicament with severe/end-stage RV failure and cor pulmonale, AKI on CKD, chronic respiratory failure, acute encephalopathy.  AKI is likely secondary to hypoperfusion from cardiorenal syndrome.  She has very high potassium and refusing medications including Lokelma .  Creatinine has worsened except today had a mild improvement from creatinine 6.5.  After initial family meeting today we have agreed to [facility] placement, time for outcomes.  Family understands need to meet in the future to regroup and discuss how to proceed pending evolution of her clinical picture.  Palliative medicine will continue  to follow.  SUMMARY OF RECOMMENDATIONS   Full code Full scope of care  Cortrak placed and tube feeds initiated.  Fluids, medications, nutrition through feeding tube Time for outcomes Anticipate next palliative medicine follow-up in 2 to 3 days Please call us  for any significant clinical change or new palliative needs in the interim Renal note also reviewed, received  call from sister Ms [name] [name] and updated her.  Symptom Management:  Primary team PMT is available to assist as needed  Code Status: Full code  Prognosis: Unable to determine  Discharge Planning: To Be Determined  Discussed with: Patient's family,   nursing team  Thank you for allowing us  to participate in the care of [name] [name] PMT will continue to support holistically.  Mod MDM [name] Serve MD Palliative Medicine Team  Team Phone # [phone] (Nights/Weekends)

## 2023-04-02 NOTE — Progress Notes (Signed)
  Progress Note   Patient: [name] [name] FMW:[id number] DOB: May 13, 1955 DOA: 03/27/2023     6 DOS: the patient was seen and examined on 04/02/2023 at 9:04 AM      Brief hospital course: 68 y.o. F with MO, OHS, OSA nonadherent with nocturnal BiPAP, CRF nonadherent with home O2 4L, dCHF, severe RHF, HTN, DM and neuropathy who presented with somnolence, found to have hypercarbic respiratory failure and cardiogenic shock.     Assessment and Plan: Acute on chronic right heart failure (HCC) Chronic diastolic CHF Cardiogenic shock due to Right heart failure Right heart failure due to pulmonary hypertension due to obesity hypoventilation syndrome  Volume status difficult to evaluate, but does not appear significantly fluid overloaded - Continue midodrine  - Hold torsemide  - Continue supplemental oxygen      Acute renal failure superimposed on stage 3b chronic kidney disease (HCC) Acute renal failure due to cardiorenal syndrome Hyperkalemia Urine output again looks good, creatinine is trended down to 4.3 - Continue Lokelma  - Consult nephrology, appreciate cares - Continue midodrine  - Continue new feeding supplements and free water    Acute metabolic encephalopathy Patient's mentation looks unchanged from yesterday, she makes eye contact, does not follow commands, and does not respond to questions with verbal answers. -Continue tube feeds, titrate up as tolerated-continue free water   Type 2 diabetes mellitus with hyperglycemia, with long-term current use of insulin  (HCC) Glucose controlled - Continue sliding scale corrections as needed - Hold Ozempic  OSA (obstructive sleep apnea) - BiPAP at night  Morbid obesity (HCC) BMI 46.9  Chronic hypotension - Continue midodrine   Anxiety and depression - Resume mirtazapine  and sertraline  when more alert  Hyperlipidemia associated with type 2 diabetes mellitus (HCC) - Hold Lipitor            Subjective: Feeding tube  placed overnight, tube feeds started this morning.  No vomiting, no fever, no change in mentation.     Physical Exam: BP (!) 168/74 (BP Location: Left Arm)   Pulse 73   Temp 98.3 F (36.8 C) (Axillary)   Resp 14   Ht 5' 2.5 (1.588 m)   Wt 120 kg   SpO2 95%   BMI 47.62 kg/m   Obese adult female, lying in bed, makes eye contact, but does not respond verbally at all RRR, heart sounds distant, no pitting edema in the stress of the knees, volume status not possible to assess Respiratory rate seems normal, lung sounds diminished, no rales or wheezes appreciated, overall lung exam is extremely limited by habitus No tenderness to palpation of the abdomen, its distention She makes eye contact, looks at me, but does not follow commands, does not make any verbalizations, and I am unable to assess for strength symmetry    Data Reviewed: Potassium up to 5.5, creatinine down to 4.3 CBC unremarkable Glucose normal  Family Communication: None present    Disposition: Status is: Inpatient         Author: Lonni SHAUNNA Dalton, MD 04/02/2023 11:00 AM  For on call review [email or web address].

## 2023-04-02 NOTE — Progress Notes (Signed)
 Brief Nutrition Note  Consult received for enteral/tube feeding initiation and management.  Adult Enteral Nutrition Protocol initiated. Full assessment to follow.  NG tube in place with tip located in mid to distal descending duodenum per xray imaging.   Admitting Dx: Obesity hypoventilation syndrome (HCC) [E66.2] Acute respiratory failure with hypercapnia (HCC) [J96.02] Acute on chronic respiratory failure with hypoxia and hypercapnia (HCC) [J96.21, J96.22] Renal failure, unspecified chronicity [N19]  Body mass index is 47.62 kg/m. Labs:  Recent Labs  Lab 03/31/23 0324 04/01/23 0601 04/02/23 0552  NA 140 142 144  K 5.4* 5.2* 5.5*  CL 100 105 107  CO2 25 23 21*  BUN 66* 66* 64*  CREATININE 5.85* 5.37* 4.35*  CALCIUM  9.3 9.2 9.5  MG 1.5* 1.8  --   GLUCOSE 79 72 104*   Currently: continuous feeding Osmolite 1.5 at 85ml/hr FWF every 4 hours Jenna Pew RDN, LDN Clinical Dietitian   If unable to reach, please contact RD Inpatient secure chat group between 8 am-4 pm daily

## 2023-04-03 ENCOUNTER — Inpatient Hospital Stay (HOSPITAL_COMMUNITY): Payer: No Typology Code available for payment source

## 2023-04-03 DIAGNOSIS — J9622 Acute and chronic respiratory failure with hypercapnia: Secondary | ICD-10-CM | POA: Diagnosis not present

## 2023-04-03 DIAGNOSIS — J9621 Acute and chronic respiratory failure with hypoxia: Secondary | ICD-10-CM | POA: Diagnosis not present

## 2023-04-03 LAB — CBC
HCT: 47 % — ABNORMAL HIGH (ref 36.0–46.0)
Hemoglobin: 13.3 g/dL (ref 12.0–15.0)
MCH: 23.5 pg — ABNORMAL LOW (ref 26.0–34.0)
MCHC: 28.3 g/dL — ABNORMAL LOW (ref 30.0–36.0)
MCV: 82.9 fL (ref 80.0–100.0)
Platelets: 250 10*3/uL (ref 150–400)
RBC: 5.67 MIL/uL — ABNORMAL HIGH (ref 3.87–5.11)
RDW: 21.5 % — ABNORMAL HIGH (ref 11.5–15.5)
WBC: 4.8 10*3/uL (ref 4.0–10.5)
nRBC: 0 % (ref 0.0–0.2)

## 2023-04-03 LAB — BASIC METABOLIC PANEL
Anion gap: 9 (ref 5–15)
BUN: 64 mg/dL — ABNORMAL HIGH (ref 8–23)
CO2: 30 mmol/L (ref 22–32)
Calcium: 10 mg/dL (ref 8.9–10.3)
Chloride: 106 mmol/L (ref 98–111)
Creatinine, Ser: 3.9 mg/dL — ABNORMAL HIGH (ref 0.44–1.00)
GFR, Estimated: 12 mL/min — ABNORMAL LOW (ref >60)
Glucose, Bld: 146 mg/dL — ABNORMAL HIGH (ref 70–99)
Potassium: 4.9 mmol/L (ref 3.5–5.1)
Sodium: 145 mmol/L (ref 135–145)

## 2023-04-03 LAB — GLUCOSE, CAPILLARY
Glucose-Capillary: 130 mg/dL — ABNORMAL HIGH (ref 70–99)
Glucose-Capillary: 141 mg/dL — ABNORMAL HIGH (ref 70–99)
Glucose-Capillary: 146 mg/dL — ABNORMAL HIGH (ref 70–99)
Glucose-Capillary: 153 mg/dL — ABNORMAL HIGH (ref 70–99)
Glucose-Capillary: 158 mg/dL — ABNORMAL HIGH (ref 70–99)
Glucose-Capillary: 178 mg/dL — ABNORMAL HIGH (ref 70–99)

## 2023-04-03 MED ORDER — PROCHLORPERAZINE EDISYLATE 10 MG/2ML IJ SOLN
10.0000 mg | Freq: Four times a day (QID) | INTRAMUSCULAR | Status: DC | PRN
  Administered 2023-04-03: 10 mg via INTRAVENOUS
  Filled 2023-04-03: qty 2

## 2023-04-03 MED ORDER — PROSOURCE TF20 ENFIT COMPATIBL EN LIQD
60.0000 mL | Freq: Two times a day (BID) | ENTERAL | Status: DC
  Filled 2023-04-03 (×3): qty 60

## 2023-04-03 MED ORDER — OSMOLITE 1.5 CAL PO LIQD
1000.0000 mL | ORAL | Status: DC
  Filled 2023-04-03 (×2): qty 1000

## 2023-04-03 MED ORDER — ONDANSETRON HCL 4 MG/2ML IJ SOLN
4.0000 mg | Freq: Four times a day (QID) | INTRAMUSCULAR | Status: DC | PRN
  Administered 2023-04-03: 4 mg via INTRAVENOUS
  Filled 2023-04-03: qty 2

## 2023-04-03 NOTE — Plan of Care (Signed)
  Problem: Education: Goal: Ability to describe self-care measures that may prevent or decrease complications (Diabetes Survival Skills Education) will improve Outcome: Progressing   Problem: Coping: Goal: Ability to adjust to condition or change in health will improve Outcome: Progressing   Problem: Coping: Goal: Level of anxiety will decrease Outcome: Progressing   Problem: Clinical Measurements: Goal: Ability to maintain clinical measurements within normal limits will improve Outcome: Not Progressing

## 2023-04-03 NOTE — Progress Notes (Signed)
 Initial Nutrition Assessment  DOCUMENTATION CODES:   Morbid obesity  INTERVENTION:  Continue with oral diet as medically applicable Increase feeding rate of osmolite 1.5 to  58ml/hr [place] TF 60ml BID Continue with FWF 100ml every 4 hours.  This will provide 1770 kcal, 108 g pro, 1392 FWF   NUTRITION DIAGNOSIS:   Increased nutrient needs related to chronic illness as evidenced by estimated needs.    GOAL:   Patient will meet greater than or equal to 90% of their needs    MONITOR:   PO intake, TF tolerance  REASON FOR ASSESSMENT:   Consult Enteral/tube feeding initiation and management  ASSESSMENT:    68 y.o. F, Presented to ED form home via EMS, after family found her confused in bed. Per EMS patient was found to have lower saturations after reportedly removing her BiPaP. Admitting with acute respiratory failure.  PMH; OHS, DM, HTN. Patient resting at time of visit. Was no verbal.  RN reports not doing well with PO intake.  MD is having SLP evaluate. Due to poor oral intake patient currently not meeting needs with increase EN support rate to help meet needs.   Admit weight: 122.7 kg Hospital weight history:  04/03/23 0500 119.6 kg 263.67 lbs  04/02/23 0707 120 kg 264.55 lbs  04/01/23 0708 118.4 kg 261.02 lbs  03/30/23 1512 123.5 kg 272.27 lbs  03/30/23 0500 123.5 kg 272.27 lbs  03/29/23 0526 122.5 kg 270.06 lbs  03/28/23 0500 122.7 kg 270.5 lbs  03/28/23 0000 122.7 kg 270.5 lbs  Weight history: 04/03/23 119.6 kg  03/08/23 126.6 kg  02/23/23 126.6 kg  01/17/23 (!) 137.5 kg  10/02/22 128.4 kg  12/01/21 136.1 kg  09/28/21 133.2 kg  09/09/21 128.2 kg  07/20/21 131.5 kg  06/13/21 133.4 kg    Average Meal Intake:  0% intake x 5 recorded meals  Nutritionally Relevant Medications: Scheduled Meds:  free water   100 mL Per Tube Q4H   midodrine   10 mg Per Tube TID WC   sodium zirconium cyclosilicate   10 g Per Tube TID    Continuous Infusions:   feeding supplement (OSMOLITE 1.5 CAL) 20 mL/hr at 04/02/23 1640    Labs Reviewed: CBG ranges from 104-146 mg/dL over the last 24 hours    NUTRITION - FOCUSED PHYSICAL EXAM:  Flowsheet Row Most Recent Value  Orbital Region No depletion  Upper Arm Region No depletion  Thoracic and Lumbar Region No depletion  Buccal Region No depletion  Temple Region No depletion  Clavicle Bone Region No depletion  Clavicle and Acromion Bone Region No depletion  Scapular Bone Region No depletion  Dorsal Hand Unable to assess  Patellar Region No depletion  Anterior Thigh Region No depletion  Posterior Calf Region No depletion  Edema (RD Assessment) Mild  Hair Reviewed  Eyes Reviewed  Mouth Reviewed  Skin Reviewed  Nails Reviewed       Diet Order:   Diet Order             Diet renal with fluid restriction Fluid restriction: Other (see comments); Room service appropriate? Yes; Fluid consistency: Thin  Diet effective now                   EDUCATION NEEDS:   Not appropriate for education at this time  Skin:  Skin Assessment: Reviewed RN Assessment  Last BM:  04/01/23  Height:   Ht Readings from Last 1 Encounters:  03/28/23 5' 2.5" (1.588 m)    Weight:  Wt Readings from Last 1 Encounters:  04/03/23 119.6 kg    Ideal Body Weight:     BMI:  Body mass index is 47.46 kg/m.  Estimated Nutritional Needs:   Kcal:  1575-1830 kcal  Protein:  70-85 g  Fluid:  90ml/kcal    Gevena Kugel RDN, LDN Clinical Dietitian   If unable to reach, please contact "RD Inpatient" secure chat group between 8 am-4 pm daily"

## 2023-04-03 NOTE — Progress Notes (Signed)
 OT Cancellation Note  Patient Details Name: [name] [name] MRN: [id number] DOB: 1955/07/16   Cancelled Treatment:    Reason Eval/Treat Not Completed: OT screened, no needs identified, will sign off Patient is TD +2-3 for ADLs at baseline with caregivers at home. Patient has hoyer and hospital bed. Patient reported being able to feed herself at this time. No OT needs identified.  Wynette Heckler, MS Acute Rehabilitation Department Office# [phone]  04/03/2023, 11:52 AM

## 2023-04-03 NOTE — Progress Notes (Signed)
  Progress Note   Patient: [name] [name] HYQ:[id number] DOB: 1955/09/11 DOA: 03/27/2023     7 DOS: the patient was seen and examined on 04/03/2023 at 10:08AM      Brief hospital course: 68 y.o. F with MO, OHS, OSA nonadherent with nocturnal BiPAP, CRF nonadherent with home O2 4L, dCHF, severe RHF, HTN, DM and neuropathy who presented with somnolence, found to have hypercarbic respiratory failure and cardiogenic shock.  Hospitalization complicated by renal failure, prolonged encephalopathy.    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  Nephrology recommending to hold diuretics at this time.  She is not a candidate for advanced cardiac treatments.    Echo shows preserved EF, severe RH dilation and severe RVSD.  Echo back to 08/2021 shows severe RV dysfunction due to obesity hypoventilation. - Continue midodrine  - Hold home torsemide  - Continue home O2 - Cntinue BiPAP at night   Acute renal failure superimposed on stage 3b chronic kidney disease (HCC) Acute renal failure due to cardiorenal syndrome Hyperkalemia Urine output remains good, creatinine down to 3.9 today.  K improved Not a dialysis candidate - Hold diuretics - Continue midodrine  - Hold lokelma    Acute metabolic encephalopathy This is markedly better today - Hold Lyrica  - Avoid psychoactive medications - Consult SLP - Continue tube feeds until cleared to swallow  Type 2 diabetes mellitus with hyperglycemia, with long-term current use of insulin  (HCC) Glucose controlled - Continue sliding scale corrections as needed - Hold Ozempic  OSA (obstructive sleep apnea) - BiPAP at night  Morbid obesity (HCC) BMI 46.9  Chronic hypotension - Continue midodrine   Anxiety and  depression - Resume mirtazapine  and sertraline tomorrow  Hyperlipidemia associated with type 2 diabetes mellitus (HCC) - Hold Lipitor            Subjective: Patient is more alert today, talking, following commands, memory seems normal.  No respiratory distress, no chest pain, no leg swelling.     Physical Exam: BP 120/87 (BP Location: Right Arm)   Pulse 77   Temp 98.5 F (36.9 C) (Oral)   Resp 17   Ht 5' 2.5" (1.588 m)   Wt 119.6 kg   SpO2 100%   BMI 47.46 kg/m   Obese adult female, sitting up in bed, interactive and appropriate RRR, heart sounds distant and hard to auscultate due to body habitus, no pitting in the extremities Respiratory rate seems normal, lung sounds diminished, I appreciate no rales or wheezes, although her exam is unreliable due to habitus Abdomen soft no tenderness palpation or guarding Attention normal, affect pleasant, judgment and insight appear normal, introduces me to her family and friend, speech fluent, face symmetric, upper extremity strength seems normal    Data Reviewed: Patient metabolic panel shows creatinine down to 3.9, BUN unchanged at 64, potassium down to 4.9 CBC shows no leukocytosis  Family Communication: Sister by phone, other sister at the bedside    Disposition: Status is: Inpatient         Author: Ephriam Hashimoto, MD 04/03/2023 2:32 PM  For on call review [email or web address].

## 2023-04-03 NOTE — Progress Notes (Signed)
 PT Cancellation Note  Patient Details Name: [name] [name] MRN: [id number] DOB: [birth year]   Cancelled Treatment:    Reason Eval/Treat Not Completed: PT screened, no needs identified, will sign off. Pt reports totally dependent with self care, has 2 grand daughters and aide who assist her with hoyer lift transfers and all self care. Pt reports currently at baseline. No acute PT needs identified, will sign off at this time.    Judyann Number PT, DPT 04/03/23, 12:16 PM

## 2023-04-03 NOTE — Progress Notes (Addendum)
 Received a call from charge RN Lauren. Patient's bridle needed to be reassessed d/t malfunction. Upon assessing the bridle, bridle noted to no longer be intact, and I was unable to retrieve the end of the string to attempt to fix the bridle. Multiple staff members assistance required d/t patient non-compliance. New 8-30F clip bridle placed and KUB ordered to ensure enteric tube still in place.    0150: Per XR, coretrack in good position
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
## 2023-04-03 NOTE — Progress Notes (Signed)
 Patient unable to wear Bipap tonight due to having Bridle in place

## 2023-04-04 DIAGNOSIS — I50811 Acute right heart failure: Secondary | ICD-10-CM

## 2023-04-04 DIAGNOSIS — N179 Acute kidney failure, unspecified: Secondary | ICD-10-CM | POA: Diagnosis not present

## 2023-04-04 DIAGNOSIS — J9602 Acute respiratory failure with hypercapnia: Secondary | ICD-10-CM | POA: Diagnosis not present

## 2023-04-04 DIAGNOSIS — N19 Unspecified kidney failure: Secondary | ICD-10-CM

## 2023-04-04 DIAGNOSIS — Z7189 Other specified counseling: Secondary | ICD-10-CM

## 2023-04-04 DIAGNOSIS — I5032 Chronic diastolic (congestive) heart failure: Secondary | ICD-10-CM | POA: Diagnosis not present

## 2023-04-04 DIAGNOSIS — J9621 Acute and chronic respiratory failure with hypoxia: Secondary | ICD-10-CM | POA: Diagnosis not present

## 2023-04-04 LAB — BASIC METABOLIC PANEL
Anion gap: 11 (ref 5–15)
BUN: 56 mg/dL — ABNORMAL HIGH (ref 8–23)
CO2: 29 mmol/L (ref 22–32)
Calcium: 9.5 mg/dL (ref 8.9–10.3)
Chloride: 106 mmol/L (ref 98–111)
Creatinine, Ser: 3.22 mg/dL — ABNORMAL HIGH (ref 0.44–1.00)
GFR, Estimated: 15 mL/min — ABNORMAL LOW (ref >60)
Glucose, Bld: 77 mg/dL (ref 70–99)
Potassium: 4.3 mmol/L (ref 3.5–5.1)
Sodium: 146 mmol/L — ABNORMAL HIGH (ref 135–145)

## 2023-04-04 LAB — GLUCOSE, CAPILLARY
Glucose-Capillary: 109 mg/dL — ABNORMAL HIGH (ref 70–99)
Glucose-Capillary: 121 mg/dL — ABNORMAL HIGH (ref 70–99)
Glucose-Capillary: 74 mg/dL (ref 70–99)
Glucose-Capillary: 75 mg/dL (ref 70–99)
Glucose-Capillary: 89 mg/dL (ref 70–99)
Glucose-Capillary: 96 mg/dL (ref 70–99)

## 2023-04-04 MED ORDER — ACETAMINOPHEN 325 MG PO TABS
650.0000 mg | ORAL_TABLET | ORAL | Status: DC | PRN
Start: 2023-04-04 — End: 2023-04-06

## 2023-04-04 MED ORDER — DOCUSATE SODIUM 50 MG/5ML PO LIQD: 100.0000 mg | Freq: Two times a day (BID) | ORAL | Status: DC | PRN

## 2023-04-04 MED ORDER — MIDODRINE HCL 5 MG PO TABS
5.0000 mg | ORAL_TABLET | Freq: Three times a day (TID) | ORAL | Status: DC
  Administered 2023-04-04 – 2023-04-05 (×2): 5 mg via ORAL
  Filled 2023-04-04 (×2): qty 1

## 2023-04-04 MED ORDER — OXYCODONE HCL 5 MG PO TABS
5.0000 mg | ORAL_TABLET | ORAL | Status: DC | PRN
  Administered 2023-04-04 (×2): 5 mg via ORAL
  Filled 2023-04-04 (×3): qty 1

## 2023-04-04 MED ORDER — MIDODRINE HCL 5 MG PO TABS: 10.0000 mg | ORAL_TABLET | Freq: Three times a day (TID) | ORAL | Status: DC

## 2023-04-04 MED ORDER — MIRTAZAPINE 15 MG PO TABS
7.5000 mg | ORAL_TABLET | Freq: Every day | ORAL | Status: DC
  Administered 2023-04-04 – 2023-04-05 (×2): 7.5 mg via ORAL
  Filled 2023-04-04 (×2): qty 1

## 2023-04-04 MED ORDER — POLYETHYLENE GLYCOL 3350 17 G PO PACK: 17.0000 g | PACK | Freq: Every day | ORAL | Status: DC | PRN

## 2023-04-04 NOTE — TOC Progression Note (Addendum)
Transition of Care [facility]) - Progression Note    Patient Details  Name: [name] [name] MRN: [id number] Date of Birth: [birth year]  [facility]) CM/SW Contact  Flynn Gwyn, Olegario Messier, RN Phone Number: 04/04/2023, 3:31 PM  Clinical Narrative:Active w/[name] HHPT/OT/RN.       Expected Discharge Plan: Home w Home Health Services Barriers to Discharge: Continued Medical Work up  Expected Discharge Plan and Services In-house Referral: Clinical Social Work     Living arrangements for the past 2 months: Apartment                             HH Agency: Other - See comment Product/process development scientist HH) Date HH Agency Contacted: 03/28/23 Time HH Agency Contacted: 1001 Representative spoke with at [facility] Agency: Jerilynn Som   Social Determinants of Health (SDOH) Interventions SDOH Screenings   Food Insecurity: No Food Insecurity (03/29/2023)  Housing: Low Risk  (03/28/2023)  Transportation Needs: No Transportation Needs (03/29/2023)  Utilities: Not At Risk (03/29/2023)  Alcohol Screen: Low Risk  (03/29/2023)  Depression (PHQ2-9): Low Risk  (03/29/2023)  Financial Resource Strain: Patient Declined (05/16/2022)   Received from [facility]  Physical Activity: Inactive (03/29/2023)  Social Connections: Patient Unable To Answer (03/28/2023)  Stress: No Stress Concern Present (05/16/2022)   Received from [facility]  Tobacco Use: Medium Risk (03/29/2023)    Readmission Risk Interventions    03/28/2023   11:29 AM 02/23/2023   11:48 AM 10/07/2022   11:37 AM  Readmission Risk Prevention Plan  Transportation Screening Complete Complete Complete  PCP or Specialist Appt within 3-5 Days  Complete Complete  HRI or Home Care Consult  Complete Complete  Social Work Consult for Recovery Care Planning/Counseling  Complete Complete  Palliative Care Screening  Not Applicable Not Applicable  Medication Review Oceanographer) Complete Complete Complete  HRI or Home Care Consult Complete    SW Recovery Care/Counseling  Consult Complete    Palliative Care Screening Not Applicable    Skilled Nursing Facility Not Applicable

## 2023-04-04 NOTE — Progress Notes (Signed)
Daily Progress Note   Patient Name: [name] [name]       Date: 04/04/2023 DOB: 29-Jul-1955  Age: 68 y.o. MRN#: [id number] Attending Physician: Alberteen Sam, * Primary Care Physician: [name] Admit Date: 03/27/2023  Reason for Consultation/Follow-up: Establishing goals of care  Patient Profile/HPI:    68 y.o. female  with past medical history of OHS, DM, HTN who presented by EMS after family found her with lower saturations after removing her bipap machine. She was  admitted on 03/27/2023 with chronic/severe cor pulmonale, hyper ability pulmonary embolism (eventually ruled out), AKI on CKD 3B with associated hyperkalemia, acute on chronic respiratory failure with hypoxia and hypercapnia due to BiPAP noncompliance with baseline OSA and OHS, chronic hypotension due to cardiorenal syndrome, acute encephalopathy, and others.    Palliative medicine was consulted for GOC conversations.  Of note we have seen the patient for the past 2 admissions as well most recently December 2024.  Subjective: Chart reviewed including labs, progress notes, imaging from this and previous encounters.  Patient improving- cortrak is out. She is awake and alert. Very pleasant. She recalls being confused and her family calling EMS. She is aware of her acute and chronic comorbidities.  She was able to participate in goals of care discussion.  She has HCPOA and living will on chart.  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  We discussed her EOL wishes- she would not want to be placed on a ventilator if it were required to keep her alive. She also would not want CPR.  However, she has not had a chance to speak to her son, [name] about her wishes.  She requested that I call and speak to him.  I  spoke to [place] and he was adamant that  he would want patient to be put on ventilator and to remain full code.  I encouraged [name] to talk with his Mom regarding her wishes and listen to her input.  I returned to [name] [name]'s room for further discussion- reviewed with her my conversation with [name] and encouraged her to discuss these decisions with him.  She does not want to change her code status at this time- she plans on speaking with [name] about her wishes.   Review of Systems  Respiratory:  Negative for  shortness of breath.   Cardiovascular:  Negative for chest pain.     Physical Exam Vitals and nursing note reviewed.  Constitutional:      General: She is not in acute distress.    Appearance: She is obese. She is ill-appearing.  Cardiovascular:     Rate and Rhythm: Normal rate.  Pulmonary:     Effort: Pulmonary effort is normal.  Skin:    General: Skin is warm and dry.  Neurological:     Mental Status: She is oriented to person, place, and time.  Psychiatric:        Mood and Affect: Mood normal.        Behavior: Behavior normal.        Thought Content: Thought content normal.        Judgment: Judgment normal.             Vital Signs: BP 139/60 (BP Location: Left Wrist)   Pulse 65   Temp 97.8 F (36.6 C) (Oral)   Resp 17   Ht 5' 2.5" (1.588 m)   Wt 118.5 kg   SpO2 98%   BMI 47.01 kg/m  SpO2: SpO2: 98 % O2 Device: O2 Device: Nasal Cannula O2 Flow Rate: O2 Flow Rate (L/min): 4 L/min  Intake/output summary:  Intake/Output Summary (Last 24 hours) at 04/04/2023 1306 Last data filed at 04/04/2023 0420 Gross per 24 hour  Intake --  Output 701 ml  Net -701 ml   LBM: Last BM Date : 04/01/23 Baseline Weight: Weight: 122.7 kg Most recent weight: Weight: 118.5 kg       Palliative Assessment/Data: PPS: 30%      Patient Active Problem List   Diagnosis Date Noted   Palliative care by specialist 04/02/2023   Goals of care, counseling/discussion 04/02/2023   Acute  on chronic right heart failure (HCC) 04/01/2023   Obesity hypoventilation syndrome (HCC) 04/01/2023   Cardiogenic shock (HCC) 04/01/2023   Acute on chronic respiratory failure with hypoxia and hypercapnia (HCC) 03/27/2023   Acute hypoxic respiratory failure (HCC) 02/16/2023   Acute on chronic hypoxic respiratory failure (HCC) 01/14/2023   Acute on chronic heart failure with preserved ejection fraction (HFpEF) (HCC) 01/13/2023   Chronic respiratory failure with hypoxia (HCC) 01/13/2023   Chronic kidney disease, stage 3a (HCC) 10/02/2022   Chronic kidney disease, stage 3b (HCC) 10/02/2022   Hyperglycemia due to type 2 diabetes mellitus (HCC) 09/26/2021   Type 2 diabetes mellitus with hyperglycemia, with long-term current use of insulin (HCC) 09/25/2021   (HFpEF) heart failure with preserved ejection fraction (HCC) 09/25/2021   Hypomagnesemia 09/08/2021   Severe sepsis (HCC) 09/07/2021   Bacteremia due to Klebsiella pneumoniae 09/07/2021   Urinary tract infection 09/07/2021   Acute respiratory failure with hypoxia and hypercapnia (HCC) 09/04/2021   DKA (diabetic ketoacidosis) (HCC) 02/02/2021   Morbid obesity with BMI of 50.0-59.9, adult (HCC) 02/02/2021   Hyperglycemia 02/01/2021   Pancreatitis 09/07/2020   Intractable nausea and vomiting 09/07/2020   Functional quadriplegia (HCC) 09/07/2020   General weakness 08/26/2020   OSA (obstructive sleep apnea) 08/26/2020   Constipation    AKI (acute kidney injury) (HCC) 08/25/2020   Acute renal failure superimposed on stage 3b chronic kidney disease (HCC) 07/30/2020   Lower extremity cellulitis 02/04/2020   Acute on chronic diastolic heart failure (HCC) 11/23/2019   Vaginal discharge 11/06/2019   Morbid obesity (HCC) 08/22/2019   Opiate overdose (HCC) 08/20/2019   Cellulitis 10/05/2018   Acute cystitis without hematuria 10/05/2018  Chronic hypotension 10/05/2018   Pressure injury of skin 10/05/2018   Hypoglycemia secondary to  sulfonylurea 11/21/2016   Substance induced mood disorder (HCC) 07/02/2014   Acute metabolic encephalopathy    Hyperkalemia    Hypokalemia    Insulin dependent type 2 diabetes mellitus (HCC)    Hyperlipidemia associated with type 2 diabetes mellitus (HCC)    Anxiety and depression    Anxiety state     Palliative Care Assessment & Plan    Assessment/Recommendations/Plan  Continue current plan of care Full code   Code Status:   Code Status: Full Code   Prognosis:  Unable to determine  Discharge Planning: To Be Determined  Care plan was discussed with patient and family.  Thank you for allowing the Palliative Medicine Team to assist in the care of this patient.  Total time:  80 minutes Prolonged billing:  Time includes:   Preparing to see the patient (e.g., review of tests) Obtaining and/or reviewing separately obtained history Performing a medically necessary appropriate examination and/or evaluation Counseling and educating the patient/family/caregiver Ordering medications, tests, or procedures Referring and communicating with other health care professionals (when not reported separately) Documenting clinical information in the electronic or other health record Independently interpreting results (not reported separately) and communicating results to the patient/family/caregiver Care coordination (not reported separately) Clinical documentation  Ocie Bob, AGNP-C Palliative Medicine   Please contact Palliative Medicine Team phone at [phone] for questions and concerns.

## 2023-04-04 NOTE — Plan of Care (Signed)
  Problem: Health Behavior/Discharge Planning: Goal: Ability to identify and utilize available resources and services will improve Outcome: Progressing   Problem: Nutritional: Goal: Maintenance of adequate nutrition will improve Outcome: Progressing   Problem: Clinical Measurements: Goal: Will remain free from infection Outcome: Progressing

## 2023-04-04 NOTE — Progress Notes (Signed)
Progress Note   Patient: [name] [name]:[id number] DOB: 1955/03/23 DOA: 03/27/2023     8 DOS: the patient was seen and examined on 04/04/2023 at 8:19AM      Brief hospital course: 68 y.o. F with MO, bedbound at baseline, OHS and OSA nonadherent with nocturnal BiPAP, chronic respiratory failure on intermittent 4L O2, dCHF, severe RHF, HTN, DM and neuropathy who presented with somnolence, found to have hypercarbic respiratory failure and cardiogenic shock.  Family found the patient confused, complaining of neuropathy pain, and having pulled off her BiPAP at night (which she reportedly does from time to time).  In the ER, hypotensive and hypoxic.  Admitted on pressors and BiPAP to ICU.   Cr 6.1 from baseline 2.5.     Assessment and Plan: Cardiogenic shock due to Right heart failure Right heart failure due to pulmonary hypertension due to obesity hypoventilation syndrome   Acute on chronic right heart failure (HCC) Chronic diastolic CHF Admitted to ICU on pressors.  Nephrology consulted.  Given her right heart failure, she is not a candidate for dialysis.    Echo here showed preserved EF, severe RH dilation and severe RVSD (these changes are observable on echo back to 08/2021) likely due to obesity hypoventilation.  Here, her O2 needs are stable BP improved today. - Wean midodrine (appears she has been on and off this since 2021) - Hold home torsemide for now - Trend Cr - Continue home O2 - Continue BiPAP at night   Acute renal failure superimposed on stage 3b chronic kidney disease (HCC) Acute renal failure due to cardiorenal syndrome Urine output remains good.    Baseline creatinine 1.5-2.1, here peaked at 6.5  Continues to trend down without diuretics or IV fluids - Hold diuretics - Hold further lokelma  Neuropathy This is her main complaint.  She was severely obtunded on admission due to Lyrica.  Due to her renal function, this and gabapentin are probably not  safe.  Oxycodone will exacerbate her respiratory status, but given her terminal cardiorenal disease, it may be the most compassionate thing to do. - Avoid gabapentin - Hold Lyrica - Oxycodone judiciously  Type 2 diabetes mellitus with hyperglycemia, with long-term current use of insulin (HCC) Glucoses controlled - Continue sliding scale corrections as needed - Hold Ozempic  OSA (obstructive sleep apnea) - BiPAP at night  Morbid obesity (HCC) BMI 46.9  Chronic hypotension - Continue midodrine, reduced dose  Anxiety and depression - Resume mirtazapine - Hold sertraline for now  Hyperlipidemia associated with type 2 diabetes mellitus (HCC) - Hold Lipitor  Acute metabolic encephalopathy The patient was severely encephalopathic on admission.  This was likely due to Lyrica dosing.  This is now resolved back to her baseline.          Subjective: Patient had a lot of nausea yesterday, but was vomiting multiple times.  Abdominal x-ray showed no obstruction, the vomiting eventually resolved.  She has had no respiratory symptoms, does not feel swollen.  She mostly complains of leg pain.     Physical Exam: BP 139/60 (BP Location: Left Wrist)   Pulse 65   Temp 97.8 F (36.6 C) (Oral)   Resp 17   Ht 5' 2.5" (1.588 m)   Wt 118.5 kg   SpO2 98%   BMI 47.01 kg/m   Obese adult female, lying in bed, interactive and appropriate RRR, heart sounds distant no murmurs possible to appreciate due to body habitus JVP not visible due to body habitus Respiratory  rate seems normal, she is on nasal cannula, lung sounds are not possible to auscultate due to body habitus Abdomen soft, no obvious tenderness to palpation, no distention. Attention normal, affect pleasant, judgment and insight appear normal, generalized weakness but symmetric strength, bedbound    Data Reviewed: Basic metabolic panel shows mild hyponatremia Creatinine down to 3.2 Glucose normal Abdomen x-ray from  yesterday shows no obstruction   Family Communication: Sister at the bedside    Disposition: Status is: Inpatient The patient has end-stage right heart failure, frequent readmissions in the last few months.  She has not developed renal failure which is thankfully improving somewhat.  However her advanced right heart failure and kidney failure, are clearly terminal, and likely she has a prognosis less than 6 months.  She is bedbound at baseline and will discharge to home once her oral intake is better, and Cr <2.5 possibly in 2 days  Would recommend discharge with hospice, family are uncertain        Author: Alberteen Sam, MD 04/04/2023 12:53 PM  For on call review [email or web address].

## 2023-04-05 DIAGNOSIS — J9622 Acute and chronic respiratory failure with hypercapnia: Secondary | ICD-10-CM | POA: Diagnosis not present

## 2023-04-05 DIAGNOSIS — J9621 Acute and chronic respiratory failure with hypoxia: Secondary | ICD-10-CM | POA: Diagnosis not present

## 2023-04-05 LAB — BASIC METABOLIC PANEL
Anion gap: 12 (ref 5–15)
BUN: 56 mg/dL — ABNORMAL HIGH (ref 8–23)
CO2: 29 mmol/L (ref 22–32)
Calcium: 9.3 mg/dL (ref 8.9–10.3)
Chloride: 100 mmol/L (ref 98–111)
Creatinine, Ser: 2.87 mg/dL — ABNORMAL HIGH (ref 0.44–1.00)
GFR, Estimated: 17 mL/min — ABNORMAL LOW (ref >60)
Glucose, Bld: 84 mg/dL (ref 70–99)
Potassium: 4.1 mmol/L (ref 3.5–5.1)
Sodium: 141 mmol/L (ref 135–145)

## 2023-04-05 LAB — CBC
HCT: 43.5 % (ref 36.0–46.0)
Hemoglobin: 12.4 g/dL (ref 12.0–15.0)
MCH: 24.1 pg — ABNORMAL LOW (ref 26.0–34.0)
MCHC: 28.5 g/dL — ABNORMAL LOW (ref 30.0–36.0)
MCV: 84.5 fL (ref 80.0–100.0)
Platelets: 219 10*3/uL (ref 150–400)
RBC: 5.15 MIL/uL — ABNORMAL HIGH (ref 3.87–5.11)
RDW: 21.2 % — ABNORMAL HIGH (ref 11.5–15.5)
WBC: 4.7 10*3/uL (ref 4.0–10.5)
nRBC: 0 % (ref 0.0–0.2)

## 2023-04-05 LAB — GLUCOSE, CAPILLARY
Glucose-Capillary: 106 mg/dL — ABNORMAL HIGH (ref 70–99)
Glucose-Capillary: 133 mg/dL — ABNORMAL HIGH (ref 70–99)
Glucose-Capillary: 74 mg/dL (ref 70–99)
Glucose-Capillary: 77 mg/dL (ref 70–99)
Glucose-Capillary: 79 mg/dL (ref 70–99)
Glucose-Capillary: 80 mg/dL (ref 70–99)

## 2023-04-05 MED ORDER — ATORVASTATIN CALCIUM 10 MG PO TABS
10.0000 mg | ORAL_TABLET | Freq: Every day | ORAL | Status: DC
  Administered 2023-04-05: 10 mg via ORAL
  Filled 2023-04-05: qty 1

## 2023-04-05 MED ORDER — ALLOPURINOL 100 MG PO TABS
50.0000 mg | ORAL_TABLET | ORAL | Status: DC
  Administered 2023-04-05: 50 mg via ORAL
  Filled 2023-04-05: qty 1

## 2023-04-05 NOTE — Progress Notes (Signed)
   04/05/23 2326  BiPAP/CPAP/SIPAP  BiPAP/CPAP/SIPAP Pt Type Adult  BiPAP/CPAP/SIPAP V60  Mask Type Full face mask  Dentures removed? Not applicable  Mask Size Medium  Set Rate 12 breaths/min  Respiratory Rate 20 breaths/min  IPAP 18 cmH20  EPAP 6 cmH2O  FiO2 (%) 40 %  Minute Ventilation 9.5  Leak 0  Peak Inspiratory Pressure (PIP) 18  Tidal Volume (Vt) 479  Patient Home Equipment No  Auto Titrate No  Press High Alarm 30 cmH2O  Press Low Alarm 5 cmH2O  CPAP/SIPAP surface wiped down Yes  BiPAP/CPAP /SiPAP Vitals  Pulse Rate 67  Resp 20  SpO2 95 %  Bilateral Breath Sounds Diminished

## 2023-04-05 NOTE — Progress Notes (Signed)
 [facility] PROGRESS NOTE    Progress Note  [name] [name]  KGM:[id number] DOB: 09/16/1955 DOA: 03/27/2023 PCP: [name]     Brief Narrative:   [name] is an 68 y.o. female who is bedbound, obstructive sleep apnea nonadherent with her BiPAP, chronic respiratory failure with hypoxia on 4 L of oxygen at home, chronic diastolic dysfunction with severe right-sided heart failure essential hypertension comes in with somnolence, according to the family they found her confused pulling on her BiPAP in the ER was found hypotensive and hypercarbic admitted to the ICU placed on BiPAP admitted with hypercarbic respiratory failure and cardiogenic shock admitted by PCCM   Assessment/Plan:   Cardiogenic shock possibly due to right-sided heart failure in the setting of pulmonary hypertension, obesity hypoventilation syndrome/acute on chronic diastolic heart failure: Admitted to the ICU on pressors. Nephrology was consulted Due to her right-sided heart failure she was not [name] candidate for dialysis. Echocardiogram showed preserved left-sided EF but severe right-sided dilation and severe RSVD. BP has been improved she has been weaned down on midodrine. Will continue to hold torsemide for now. Continue BiPAP at night. Has had poor oral intake due to vomiting.  Acute renal failure superimposed on stage 3b chronic kidney disease (HCC) With [name] baseline creatinine of 1.1-2.0, peaked at 6.5. She was started on IV fluids pressors, has now been weaned down on her midodrine. Diuretics were held on admission. Her creatinine has slowly trending down, she is close to baseline.  Peripheral neuropathy: This seems to be her main complaint, she was severely obtunded on admission due to Lyrica in the setting of renal dysfunction. Try to avoid Neurontin, hold Lyrica for now. Be cautious with oxycodone, as this might worsen her respiratory status and mentation.  Diabetes mellitus type  2 with hyperglycemia with long-term insulin use: Holding Ozempic. Continue sliding scale insulin.  Obstructive sleep apnea: Due to BiPAP at night.  Morbid obesity: Noted.  Chronic hypotension: Currently on midodrine at [name] lower dose.  Anxiety and depression: Continue Remeron.  Hyperlipidemia: Holding Lipitor.  Acute metabolic encephalopathy: Likely due to Lyrica and hypercarbia. These have now resolved.  Palliative care by specialist/  Goals of care, counseling/discussion Palliative care met with family continue current management she remains [name] full code.  DVT proophylaxis: lovenox Family Communication:none Status is: Inpatient Remains inpatient appropriate because: Acute metabolic encephalopathy possibly due to acute hypercarbia plus or minus polypharmacy    Code Status:     Code Status Orders  (From admission, onward)           Start     Ordered   03/27/23 1950  Full code  Continuous       Question:  By:  Answer:  Default: patient does not have capacity for decision making, no surrogate or prior directive available   03/27/23 1957           Code Status History     Date Active Date Inactive Code Status Order ID Comments User Context   02/16/2023 0001 02/23/2023 1736 Full Code [id number]  Alan Mulder, MD ED   01/13/2023 2157 01/17/2023 1816 Full Code [id number]  Charlsie Quest, MD ED   10/02/2022 2119 10/07/2022 1824 Full Code [id number]  Anselm Jungling, DO ED   09/25/2021 2316 09/28/2021 1746 Full Code [id number]  Charlsie Quest, MD ED   09/04/2021 2332 09/09/2021 1645 Full Code [id number]  Oretha Milch, MD ED   02/01/2021 1904 02/03/2021 2234 Full Code [id number]  [name] [name],  [name] A, DO Inpatient   09/07/2020 2114 09/26/2020 0527 Full Code [id number]  Therisa Doyne, MD ED   08/26/2020 0100 08/29/2020 0920 Full Code [id number]  Briscoe Deutscher, MD ED   07/25/2020 1650 08/07/2020 0216 Full Code [id number]  Glade Lloyd, MD ED   02/04/2020 0308 02/12/2020 0303 Full Code  [id number]  Eduard Clos, MD Inpatient   11/06/2019 0028 11/23/2019 2051 Full Code [id number]  [name] Large, MD ED   10/19/2019 0620 10/20/2019 2247 Full Code [id number]  Eduard Clos, MD ED   08/20/2019 1754 08/24/2019 0604 Full Code [id number]  Versie Starks, DO ED   10/05/2018 0743 10/09/2018 1818 Full Code [id number]  Eduard Clos, MD Inpatient   11/21/2016 2330 11/24/2016 1656 Full Code [id number]  Hillary Bow, DO ED   06/28/2014 1508 07/02/2014 1725 Full Code [id number]  Coralyn Helling, MD ED         IV Access:   Peripheral IV   Procedures and diagnostic studies:   DG Abd 1 View Result Date: 04/03/2023 CLINICAL DATA:  Vomiting EXAM: ABDOMEN - 1 VIEW COMPARISON:  04/02/2023 FINDINGS: Embolization coils to the left of the spine. Enteric tube is looped upon itself with the tip overlying the stomach. Nonobstructed gas pattern with contrast or radiopaque material in the colon. IMPRESSION: Enteric tube looped upon itself in the stomach. Nonobstructed gas pattern Electronically Signed   By: Jasmine Pang M.D.   On: 04/03/2023 20:59     Medical Consultants:   None.   Subjective:    [name] [name] relates she feels good, has been tolerating liquids.  Objective:    Vitals:   04/04/23 2200 04/04/23 2355 04/05/23 0421 04/05/23 0636  BP: (!) 166/74 (!) 131/54 92/68   Pulse: 60 (!) 56 (!) 57   Resp:   20   Temp: 98.1 F (36.7 C)  (!) 97.4 F (36.3 C) 98.1 F (36.7 C)  TempSrc: Oral  Oral Oral  SpO2: 100%  100%   Weight:      Height:       SpO2: 100 % O2 Flow Rate (L/min): 4 L/min FiO2 (%): 40 %   Intake/Output Summary (Last 24 hours) at 04/05/2023 0740 Last data filed at 04/04/2023 1711 Gross per 24 hour  Intake --  Output 350 ml  Net -350 ml   Filed Weights   04/02/23 0707 04/03/23 0500 04/04/23 0500  Weight: 120 kg 119.6 kg 118.5 kg    Exam: General exam: In no acute distress. Respiratory system: Good air movement and clear to  auscultation. Cardiovascular system: S1 & S2 heard, RRR. No JVD. Gastrointestinal system: Abdomen is nondistended, soft and nontender.  Extremities: No pedal edema. Skin: No rashes, lesions or ulcers Psychiatry: Judgement and insight appear normal. Mood & affect appropriate.    Data Reviewed:    Labs: Basic Metabolic Panel: Recent Labs  Lab 03/31/23 0324 04/01/23 0601 04/02/23 0552 04/03/23 0517 04/04/23 0521 04/05/23 0543  NA 140 142 144 145 146* 141  K 5.4* 5.2* 5.5* 4.9 4.3 4.1  CL 100 105 107 106 106 100  CO2 25 23 21* 30 29 29   GLUCOSE 79 72 104* 146* 77 84  BUN 66* 66* 64* 64* 56* 56*  CREATININE 5.85* 5.37* 4.35* 3.90* 3.22* 2.87*  CALCIUM 9.3 9.2 9.5 10.0 9.5 9.3  MG 1.5* 1.8  --   --   --   --    GFR Estimated Creatinine Clearance: 22.8 mL/min ([name]) (by C-G formula based on  SCr of 2.87 mg/dL (H)). Liver Function Tests: No results for input(s): "AST", "ALT", "ALKPHOS", "BILITOT", "PROT", "ALBUMIN" in the last 168 hours. No results for input(s): "LIPASE", "AMYLASE" in the last 168 hours. No results for input(s): "AMMONIA" in the last 168 hours. Coagulation profile No results for input(s): "INR", "PROTIME" in the last 168 hours. COVID-19 Labs  No results for input(s): "DDIMER", "FERRITIN", "LDH", "CRP" in the last 72 hours.  Lab Results  Component Value Date   SARSCOV2NAA NEGATIVE 03/28/2023   SARSCOV2NAA NEGATIVE 03/27/2023   SARSCOV2NAA NEGATIVE 02/15/2023   SARSCOV2NAA NEGATIVE 01/13/2023    CBC: Recent Labs  Lab 03/31/23 0324 04/01/23 0601 04/02/23 0552 04/03/23 0517 04/05/23 0543  WBC 5.5 5.1 5.2 4.8 4.7  HGB 13.1 12.3 12.5 13.3 12.4  HCT 46.3* 43.8 44.9 47.0* 43.5  MCV 85.7 84.9 83.5 82.9 84.5  PLT 257 281 253 250 219   Cardiac Enzymes: No results for input(s): "CKTOTAL", "CKMB", "CKMBINDEX", "TROPONINI" in the last 168 hours. BNP (last 3 results) No results for input(s): "PROBNP" in the last 8760 hours. CBG: Recent Labs  Lab  04/04/23 1115 04/04/23 1617 04/04/23 2008 04/05/23 0011 04/05/23 0412  GLUCAP 109* 75 96 80 79   D-Dimer: No results for input(s): "DDIMER" in the last 72 hours. Hgb A1c: No results for input(s): "HGBA1C" in the last 72 hours. Lipid Profile: No results for input(s): "CHOL", "HDL", "LDLCALC", "TRIG", "CHOLHDL", "LDLDIRECT" in the last 72 hours. Thyroid function studies: No results for input(s): "TSH", "T4TOTAL", "T3FREE", "THYROIDAB" in the last 72 hours.  Invalid input(s): "FREET3" Anemia work up: No results for input(s): "VITAMINB12", "FOLATE", "FERRITIN", "TIBC", "IRON", "RETICCTPCT" in the last 72 hours. Sepsis Labs: Recent Labs  Lab 04/01/23 0601 04/02/23 0552 04/03/23 0517 04/05/23 0543  WBC 5.1 5.2 4.8 4.7   Microbiology Recent Results (from the past 240 hours)  Resp panel by RT-PCR (RSV, Flu [name]&B, Covid) Anterior Nasal Swab     Status: None   Collection Time: 03/27/23  4:23 PM   Specimen: Anterior Nasal Swab  Result Value Ref Range Status   SARS Coronavirus 2 by RT PCR NEGATIVE NEGATIVE Final    Comment: (NOTE) SARS-CoV-2 target nucleic acids are NOT DETECTED.  The SARS-CoV-2 RNA is generally detectable in upper respiratory specimens during the acute phase of infection. The lowest concentration of SARS-CoV-2 viral copies this assay can detect is 138 copies/mL. [name] negative result does not preclude SARS-Cov-2 infection and should not be used as the sole basis for treatment or other patient management decisions. [name] negative result may occur with  improper specimen collection/handling, submission of specimen other than nasopharyngeal swab, presence of viral mutation(s) within the areas targeted by this assay, and inadequate number of viral copies(<138 copies/mL). [name] negative result must be combined with clinical observations, patient history, and epidemiological information. The expected result is Negative.  Fact Sheet for Patients:   BloggerCourse.com  Fact Sheet for Healthcare Providers:  SeriousBroker.it  This test is no t yet approved or cleared by the [place] FDA and  has been authorized for detection and/or diagnosis of SARS-CoV-2 by FDA under an Emergency Use Authorization (EUA). This EUA will remain  in effect (meaning this test can be used) for the duration of the COVID-19 declaration under Section 564(b)(1) of the Act, 21 U.S.C.section 360bbb-3(b)(1), unless the authorization is terminated  or revoked sooner.       Influenza [name] by PCR NEGATIVE NEGATIVE Final   Influenza B by PCR NEGATIVE NEGATIVE Final  Comment: (NOTE) The Xpert Xpress SARS-CoV-2/FLU/RSV plus assay is intended as an aid in the diagnosis of influenza from Nasopharyngeal swab specimens and should not be used as [name] sole basis for treatment. Nasal washings and aspirates are unacceptable for Xpert Xpress SARS-CoV-2/FLU/RSV testing.  Fact Sheet for Patients: BloggerCourse.com  Fact Sheet for Healthcare Providers: SeriousBroker.it  This test is not yet approved or cleared by the [place] FDA and has been authorized for detection and/or diagnosis of SARS-CoV-2 by FDA under an Emergency Use Authorization (EUA). This EUA will remain in effect (meaning this test can be used) for the duration of the COVID-19 declaration under Section 564(b)(1) of the Act, 21 U.S.C. section 360bbb-3(b)(1), unless the authorization is terminated or revoked.     Resp Syncytial Virus by PCR NEGATIVE NEGATIVE Final    Comment: (NOTE) Fact Sheet for Patients: BloggerCourse.com  Fact Sheet for Healthcare Providers: SeriousBroker.it  This test is not yet approved or cleared by the [place] FDA and has been authorized for detection and/or diagnosis of SARS-CoV-2 by FDA under an Emergency Use  Authorization (EUA). This EUA will remain in effect (meaning this test can be used) for the duration of the COVID-19 declaration under Section 564(b)(1) of the Act, 21 U.S.C. section 360bbb-3(b)(1), unless the authorization is terminated or revoked.  Performed at [facility], [address], [place], [place] [address]   Culture, blood (Routine X 2) w Reflex to ID Panel     Status: None   Collection Time: 03/27/23  9:25 PM   Specimen: BLOOD LEFT ARM  Result Value Ref Range Status   Specimen Description   Final    BLOOD LEFT ARM Performed at [facility], [address], [place], [place] [address]    Special Requests   Final    BOTTLES DRAWN AEROBIC AND ANAEROBIC Blood Culture results may not be optimal due to an inadequate volume of blood received in culture bottles Performed at [facility], [address] [place], [place] [address]    Culture   Final    NO GROWTH 5 DAYS Performed at [facility], [address] [place], [place] [address]    Report Status 04/02/2023 FINAL  Final  Culture, blood (Routine X 2) w Reflex to ID Panel     Status: None   Collection Time: 03/27/23  9:37 PM   Specimen: BLOOD LEFT HAND  Result Value Ref Range Status   Specimen Description   Final    BLOOD LEFT HAND Performed at [facility], [address], [place], [place] [address]    Special Requests   Final    BOTTLES DRAWN AEROBIC AND ANAEROBIC Blood Culture results may not be optimal due to an inadequate volume of blood received in culture bottles Performed at [facility], [address], [place], [place] [address]    Culture   Final    NO GROWTH 5 DAYS Performed at [facility], [address], [place], [place] [address]    Report Status 04/02/2023 FINAL  Final  SARS Coronavirus 2 by RT PCR (hospital order, performed in [facility] hospital lab) *cepheid single result test* Anterior  Nasal Swab     Status: None   Collection Time: 03/28/23  7:38 AM   Specimen: Anterior Nasal Swab  Result Value Ref Range Status   SARS Coronavirus 2 by RT PCR NEGATIVE NEGATIVE Final    Comment: (NOTE) SARS-CoV-2 target nucleic acids are  NOT DETECTED.  The SARS-CoV-2 RNA is generally detectable in upper and lower respiratory specimens during the acute phase of infection. The lowest concentration of SARS-CoV-2 viral copies this assay can detect is 250 copies / mL. [name] negative result does not preclude SARS-CoV-2 infection and should not be used as the sole basis for treatment or other patient management decisions.  [name] negative result may occur with improper specimen collection / handling, submission of specimen other than nasopharyngeal swab, presence of viral mutation(s) within the areas targeted by this assay, and inadequate number of viral copies (<250 copies / mL). [name] negative result must be combined with clinical observations, patient history, and epidemiological information.  Fact Sheet for Patients:   RoadLapTop.co.za  Fact Sheet for Healthcare Providers: http://kim-miller.com/  This test is not yet approved or  cleared by the [place] FDA and has been authorized for detection and/or diagnosis of SARS-CoV-2 by FDA under an Emergency Use Authorization (EUA).  This EUA will remain in effect (meaning this test can be used) for the duration of the COVID-19 declaration under Section 564(b)(1) of the Act, 21 U.S.C. section 360bbb-3(b)(1), unless the authorization is terminated or revoked sooner.  Performed at [facility], [address], [place], [place] [address]   MRSA Next Gen by PCR, Nasal     Status: Abnormal   Collection Time: 03/28/23  7:38 AM   Specimen: Nasal Mucosa; Nasal Swab  Result Value Ref Range Status   MRSA by PCR Next Gen DETECTED ([name]) NOT DETECTED Final    Comment: RESULT CALLED TO, READ BACK  BY AND VERIFIED WITH: PULEO, R. RN AT 1138 ON 03/28/23 BY JM (NOTE) The GeneXpert MRSA Assay (FDA approved for NASAL specimens only), is one component of [name] comprehensive MRSA colonization surveillance program. It is not intended to diagnose MRSA infection nor to guide or monitor treatment for MRSA infections. Test performance is not FDA approved in patients less than 63 years old. Performed at [facility], [address] [place], [place] [address]      Medications:    Chlorhexidine Gluconate Cloth  6 each Topical Daily   heparin injection (subcutaneous)  5,000 Units Subcutaneous Q8H   insulin aspart  0-20 Units Subcutaneous Q4H   lidocaine  1 patch Transdermal Q24H   midodrine  5 mg Oral TID WC   mirtazapine  7.5 mg Oral QHS   mouth rinse  15 mL Mouth Rinse 4 times per day   Continuous Infusions:    LOS: 9 days   Marinda Elk  [facility]  04/05/2023, 7:40 AM

## 2023-04-06 ENCOUNTER — Other Ambulatory Visit (HOSPITAL_COMMUNITY): Payer: Self-pay

## 2023-04-06 ENCOUNTER — Other Ambulatory Visit: Payer: Self-pay

## 2023-04-06 DIAGNOSIS — J9621 Acute and chronic respiratory failure with hypoxia: Secondary | ICD-10-CM | POA: Diagnosis not present

## 2023-04-06 DIAGNOSIS — J9622 Acute and chronic respiratory failure with hypercapnia: Secondary | ICD-10-CM | POA: Diagnosis not present

## 2023-04-06 LAB — BASIC METABOLIC PANEL
Anion gap: 11 (ref 5–15)
BUN: 47 mg/dL — ABNORMAL HIGH (ref 8–23)
CO2: 29 mmol/L (ref 22–32)
Calcium: 9.6 mg/dL (ref 8.9–10.3)
Chloride: 103 mmol/L (ref 98–111)
Creatinine, Ser: 2.68 mg/dL — ABNORMAL HIGH (ref 0.44–1.00)
GFR, Estimated: 19 mL/min — ABNORMAL LOW (ref >60)
Glucose, Bld: 95 mg/dL (ref 70–99)
Potassium: 4.3 mmol/L (ref 3.5–5.1)
Sodium: 143 mmol/L (ref 135–145)

## 2023-04-06 LAB — GLUCOSE, CAPILLARY
Glucose-Capillary: 78 mg/dL (ref 70–99)
Glucose-Capillary: 80 mg/dL (ref 70–99)
Glucose-Capillary: 84 mg/dL (ref 70–99)

## 2023-04-06 MED ORDER — MIDODRINE HCL 5 MG PO TABS
2.5000 mg | ORAL_TABLET | Freq: Two times a day (BID) | ORAL | 0 refills | Status: DC
  Filled 2023-04-06 (×2): qty 15, 15d supply, fill #0

## 2023-04-06 MED ORDER — MIDODRINE HCL 5 MG PO TABS: 2.5000 mg | ORAL_TABLET | Freq: Two times a day (BID) | ORAL | Status: DC

## 2023-04-06 NOTE — Care Plan (Signed)
Pt given AVS for discharge, no concerns All lines d/c'd Patient left with all belongings PTAR at bedside

## 2023-04-06 NOTE — TOC Transition Note (Signed)
Transition of Care [facility]) - Discharge Note   Patient Details  Name: [name] [name] MRN: [id number] Date of Birth: 01-Jul-1955  Transition of Care [facility]) CM/SW Contact:  Adrian Prows, RN Phone Number: 04/06/2023, 10:27 AM   Clinical Narrative:    D/C orders and HHPT/OT orders received; pt says she has these services w/ [facility]; pt called [facility] agency and spoke w/ [place] at [phone]); she confirms pt is active w/ agency; pt's son [name] notified of d/c; he agrees and will be at  home waiting for pt; PTAR called at [phone]; no TOC needs.   Final next level of care: Home w Home Health Services Barriers to Discharge: No Barriers Identified   Patient Goals and CMS Choice            Discharge Placement                       Discharge Plan and Services Additional resources added to the After Visit Summary for   In-house Referral: Clinical Social Work              DME Arranged: N/A DME Agency: NA       HH Arranged: PT, OT HH Agency: Other - See comment Product/process development scientist Home Health) Date HH Agency Contacted: 04/06/23 Time HH Agency Contacted: 1026 Representative spoke with at [facility] Agency: [place]  Social Drivers of Health (SDOH) Interventions SDOH Screenings   Food Insecurity: No Food Insecurity (03/29/2023)  Housing: Low Risk  (03/28/2023)  Transportation Needs: No Transportation Needs (03/29/2023)  Utilities: Not At Risk (03/29/2023)  Alcohol Screen: Low Risk  (03/29/2023)  Depression (PHQ2-9): Low Risk  (03/29/2023)  Financial Resource Strain: Patient Declined (05/16/2022)   Received from [facility]  Physical Activity: Inactive (03/29/2023)  Social Connections: Patient Unable To Answer (03/28/2023)  Stress: No Stress Concern Present (05/16/2022)   Received from [facility]  Tobacco Use: Medium Risk (03/29/2023)     Readmission Risk Interventions    03/28/2023   11:29 AM 02/23/2023   11:48 AM 10/07/2022   11:37 AM  Readmission Risk Prevention  Plan  Transportation Screening Complete Complete Complete  PCP or Specialist Appt within 3-5 Days  Complete Complete  HRI or Home Care Consult  Complete Complete  Social Work Consult for Recovery Care Planning/Counseling  Complete Complete  Palliative Care Screening  Not Applicable Not Applicable  Medication Review Oceanographer) Complete Complete Complete  HRI or Home Care Consult Complete    SW Recovery Care/Counseling Consult Complete    Palliative Care Screening Not Applicable    Skilled Nursing Facility Not Applicable

## 2023-04-06 NOTE — Discharge Summary (Signed)
 Physician Discharge Summary  REX MAGEE UJW:[id number] DOB: 25-Sep-1955 DOA: 03/27/2023  PCP: [name]  Admit date: 03/27/2023 Discharge date: 04/06/2023  Admitted From: Home Disposition:  Home  Recommendations for Outpatient Follow-up:  Follow up with PCP in 1-2 weeks Please obtain BMP/CBC in one week Home Health:Yes Equipment/Devices:None  Discharge Condition:Stable CODE STATUS:Full Diet recommendation: Heart Healthy  Brief/Interim Summary: 68 y.o. female who is bedbound, obstructive sleep apnea nonadherent with her BiPAP, chronic respiratory failure with hypoxia on 4 L of oxygen at home, chronic diastolic dysfunction with severe right-sided heart failure essential hypertension comes in with somnolence, according to the family they found her confused pulling on her BiPAP in the ER was found hypotensive and hypercarbic admitted to the ICU placed on BiPAP admitted with hypercarbic respiratory failure and cardiogenic shock admitted by PCCM   Discharge Diagnoses:  Principal Problem:   Acute on chronic respiratory failure with hypoxia and hypercapnia (HCC) Active Problems:   Acute on chronic right heart failure (HCC)   Acute renal failure superimposed on stage 3b chronic kidney disease (HCC)   Acute metabolic encephalopathy   Hyperkalemia   Hyperlipidemia associated with type 2 diabetes mellitus (HCC)   Anxiety and depression   Chronic hypotension   Morbid obesity (HCC)   General weakness   OSA (obstructive sleep apnea)   Type 2 diabetes mellitus with hyperglycemia, with long-term current use of insulin (HCC)   (HFpEF) heart failure with preserved ejection fraction (HCC)   Obesity hypoventilation syndrome (HCC)   Cardiogenic shock (HCC)   Palliative care by specialist   Goals of care, counseling/discussion  Cardiogenic shock possibly due to right-sided heart failure in the setting of pulmonary hypertension/obesity hypoventilation syndrome/acute on chronic  diastolic heart failure: Admitted to the ICU on pressors nephrology was consulted. Due to right-sided heart failure she was not a candidate for dialysis. 2D echo showed preserved EF on the left side, but severe right-sided dilation and severe RSVD. She was transition to IV midodrine and diuresed slowly. Torsemide was held on admission and she had good urine output will be resume as an outpatient. Palliative care met with family she has an extremely poor prognosis but the patient and the family despite knowing she has a poor prognosis would like to continue current management and she remains a full code.  Acute Kidney injury superimposed on chronic kidney disease stage IIIb: With a baseline creatinine 1.2-2.0, peaked 6.5. She was started on IV fluids and pressors then eventually transition to midodrine and her midodrine was titrated down. Her diuretics were held on admission she will resume it as an outpatient.  Peripheral neuropathy: Lyrica and metformin were both discontinued as an inpatient and she did not require them as an inpatient. She will hold off Lyrica and Neurontin.  Diabetes mellitus type 2 with hyperglycemia with long-term insulin use: No changes made to her medication.  Obstructive sleep apnea: Continue CPAP at night.  Morbid obesity: Noted.  Chronic hypotension: She will go home on a lower dose of midodrine.  Anxiety and depression: Continue Remeron.  Hyperlipidemia: Resume lipid.  Acute metabolic encephalopathy: Likely due to Lyrica and hypercarbia and is now resolved.     Discharge Instructions  Discharge Instructions     Diet - low sodium heart healthy   Complete by: As directed    Increase activity slowly   Complete by: As directed       Allergies as of 04/06/2023       Reactions   Lisinopril Swelling  Morphine Hives, Itching, Rash   Penicillins Hives, Itching, Nausea And Vomiting, Rash   Metformin Other (See Comments)   "Allergic," per  caretaker   Sulfa Antibiotics Other (See Comments)   "Hypoglycemia," per the [facility]        Medication List     STOP taking these medications    albuterol 108 (90 Base) MCG/ACT inhaler Commonly known as: VENTOLIN HFA   allopurinol 100 MG tablet Commonly known as: ZYLOPRIM   mirtazapine 15 MG tablet Commonly known as: REMERON       TAKE these medications    atorvastatin 10 MG tablet Commonly known as: LIPITOR Take 10 mg by mouth at bedtime.   Insulin Glargine Solostar 100 UNIT/ML Solostar Pen Commonly known as: LANTUS Inject 35 Units into the skin in the morning.   melatonin 3 MG Tabs tablet Take 3 mg by mouth at bedtime.   midodrine 2.5 MG tablet Commonly known as: PROAMATINE Take 1 tablet (2.5 mg total) by mouth 2 (two) times daily with a meal. What changed:  medication strength how much to take when to take this   ondansetron 8 MG tablet Commonly known as: ZOFRAN Take 4 mg by mouth every 8 (eight) hours as needed for nausea or vomiting.   potassium chloride SA 20 MEQ tablet Commonly known as: KLOR-CON M Take 1 tablet (20 mEq total) by mouth daily.   Semaglutide (2 MG/DOSE) 8 MG/3ML Sopn Inject 2 mg into the skin every Thursday.   sertraline 50 MG tablet Commonly known as: ZOLOFT Take 50 mg by mouth in the morning.   torsemide 20 MG tablet Commonly known as: DEMADEX Take 2 tablets (40 mg total) by mouth 2 (two) times daily.        Allergies  Allergen Reactions   Lisinopril Swelling   Morphine Hives, Itching and Rash   Penicillins Hives, Itching, Nausea And Vomiting and Rash   Metformin Other (See Comments)    "Allergic," per caretaker   Sulfa Antibiotics Other (See Comments)    "Hypoglycemia," per the [facility]    Consultations: Critical care   Procedures/Studies: DG Abd 1 View Result Date: 04/03/2023 CLINICAL DATA:  Vomiting EXAM: ABDOMEN - 1 VIEW COMPARISON:  04/02/2023 FINDINGS: Embolization coils to the left of the spine. Enteric tube  is looped upon itself with the tip overlying the stomach. Nonobstructed gas pattern with contrast or radiopaque material in the colon. IMPRESSION: Enteric tube looped upon itself in the stomach. Nonobstructed gas pattern Electronically Signed   By: Jasmine Pang M.D.   On: 04/03/2023 20:59   DG Chest Port 1 View Result Date: 04/03/2023 CLINICAL DATA:  [id number].  Encounter for nasogastric tube placement. EXAM: PORTABLE CHEST 1 VIEW COMPARISON:  Portable exam yesterday at 7:09 a.m. FINDINGS: 1:44 a.m. Dobbhoff feeding tube has been inserted and the tip extends through the pylorus into the mid to distal descending duodenum. The bowel pattern is not fully evaluated. No dilated bowel is seen in the upper abdomen. There are embolization Coils in the medial left upper abdomen, possibly in the gastroduodenal artery. There is no supine evidence of free air. The visualized chest is clear of acute changes, with cardiomegaly again noted. IMPRESSION: 1. Dobbhoff feeding tube tip extends through the pylorus into the mid to distal descending duodenum. Positioning unchanged. 2. Cardiomegaly. 3. No dilated bowel in the upper abdomen. Electronically Signed   By: Almira Bar M.D.   On: 04/03/2023 04:44   DG Abd 1 View Result Date: 04/02/2023 CLINICAL DATA:  [id number]. Encounter for nasogastric tube placement. Feeding tube advanced by nurse. EXAM: ABDOMEN - 1 VIEW COMPARISON:  Earlier study today at 2:45 a.m. FINDINGS: 7:09 a.m. The feeding tube has been advanced through the pylorus and the tip is in the mid to distal descending duodenum. The bowel pattern is nonobstructive. The bladder is catheterized. No urinary calculus is seen. Iliofemoral calcific arteriosclerosis is noted present. Degenerative change of the spine. There is no supine evidence of free air or other significant findings. IMPRESSION: The feeding tube has been advanced through the pylorus and the tip is in the mid to distal descending duodenum. In all other respects  no further changes. Electronically Signed   By: Almira Bar M.D.   On: 04/02/2023 07:52   DG Abd 1 View Result Date: 04/02/2023 CLINICAL DATA:  NG tube placement EXAM: ABDOMEN - 1 VIEW COMPARISON:  04/01/2023 FINDINGS: No visible NG tube on the current study. A feeding catheter is identified with the tip positioned in the distal stomach directed towards the pylorus. Advancing the tube another 5-6 cm should get the tip in the region of the gastric pylorus. No pleural effusion in the lung bases. Visualized abdomen demonstrates nonspecific gas pattern. Embolization coils are noted over the medial left abdomen. IMPRESSION: Feeding catheter tip is positioned in the distal stomach directed towards the pylorus. Advancing the tube another 5-6 cm should get the tip in the region of the gastric pylorus. Electronically Signed   By: Kennith Center M.D.   On: 04/02/2023 05:16   DG Abd 1 View Result Date: 04/01/2023 CLINICAL DATA:  Enteric catheter placement EXAM: ABDOMEN - 1 VIEW COMPARISON:  04/01/2023 FINDINGS: Frontal view of the lower chest and upper abdomen demonstrates enteric catheter passing below diaphragm, weighted tip overlying the gastric body. Embolic coils overlie the central upper abdomen. Nonspecific bowel gas pattern. IMPRESSION: 1. Enteric catheter tip projecting over the gastric body. Electronically Signed   By: Sharlet Salina M.D.   On: 04/01/2023 21:51   DG Abd 1 View Result Date: 04/01/2023 CLINICAL DATA:  NG tube placement EXAM: ABDOMEN - 1 VIEW COMPARISON:  11/22/2019.  CT 03/27/2023 FINDINGS: Feeding tube is in place with the tip in the mid to distal stomach. IMPRESSION: Feeding tube tip in the stomach. Electronically Signed   By: Charlett Nose M.D.   On: 04/01/2023 18:14   VAS [place] LOWER EXTREMITY VENOUS (DVT) Result Date: 03/31/2023  Lower Venous DVT Study Patient Name:  BRITNI DRISCOLL  Date of Exam:   03/30/2023 Medical Rec #: [id number]           Accession #:    [id number] Date of Birth:  06/27/1955           Patient Gender: F Patient Age:   23 years Exam Location:  [facility] Procedure:      VAS [place] LOWER EXTREMITY VENOUS (DVT) Referring Phys: Durel Salts --------------------------------------------------------------------------------  Indications: Edema.  Limitations: Body habitus, poor ultrasound/tissue interface and patient yelling and moving legs throughout exam making it extremely difficult. Comparison Study: RLEV on 03/28/23 was negative for DVT. Performing Technologist: Ernestene Mention RVT, RDMS  Examination Guidelines: A complete evaluation includes B-mode imaging, spectral Doppler, color Doppler, and power Doppler as needed of all accessible portions of each vessel. Bilateral testing is considered an integral part of a complete examination. Limited examinations for reoccurring indications may be performed as noted. The reflux portion of the exam is performed with the patient in reverse Trendelenburg.   RLEV showed patent  CFV on 03/28/23  +---------+---------------+---------+-----------+----------+-------------------+ LEFT     CompressibilityPhasicitySpontaneityPropertiesThrombus Aging      +---------+---------------+---------+-----------+----------+-------------------+ CFV                     Yes      Yes                  patent by                                                                 color/doppler only  +---------+---------------+---------+-----------+----------+-------------------+ SFJ                                                   patent by color                                                           only                +---------+---------------+---------+-----------+----------+-------------------+ FV Prox  Full           Yes      Yes                                      +---------+---------------+---------+-----------+----------+-------------------+ FV Mid   Full           Yes      Yes                                       +---------+---------------+---------+-----------+----------+-------------------+ FV DistalFull           Yes      Yes                                      +---------+---------------+---------+-----------+----------+-------------------+ PFV                                                   not visualized      +---------+---------------+---------+-----------+----------+-------------------+ POP      Full           Yes      Yes                                      +---------+---------------+---------+-----------+----------+-------------------+ PTV      Full                                                             +---------+---------------+---------+-----------+----------+-------------------+  PERO     Full                                         Not well visualized +---------+---------------+---------+-----------+----------+-------------------+     Summary: LEFT: - There is no evidence of deep vein thrombosis in the lower extremity. However, portions of this examination were limited- see technologist comments above.  - No cystic structure found in the popliteal fossa.  *See table(s) above for measurements and observations. Electronically signed by Gerarda Fraction on 03/31/2023 at 4:18:34 PM.    Final    NM Pulmonary Perfusion Result Date: 03/30/2023 CLINICAL DATA:  Pulmonary embolism (PE) suspected, high prob Chronic respiratory failure. Shortness of breath on 5 liters of oxygen. EXAM: NUCLEAR MEDICINE PERFUSION LUNG SCAN TECHNIQUE: Perfusion images were obtained in multiple projections after intravenous injection of radiopharmaceutical. RADIOPHARMACEUTICALS:  4.4 mCi Tc-38m MAA IV COMPARISON:  Radiographs 03/29/2023 and 03/27/2023. Noncontrast chest CT 10/02/2022. FINDINGS: The pulmonary perfusion is within normal limits for body habitus. There are no wedge-shaped perfusion defects to suggest pulmonary embolism. The heart is chronically enlarged. IMPRESSION: No evidence of acute  pulmonary embolism on perfusion scintigraphy by PISAPED criteria. Electronically Signed   By: Carey Bullocks M.D.   On: 03/30/2023 13:10   DG CHEST PORT 1 VIEW Result Date: 03/29/2023 CLINICAL DATA:  Respiratory distress EXAM: PORTABLE CHEST 1 VIEW COMPARISON:  Chest x-ray 03/27/2023 FINDINGS: The heart is enlarged. There central pulmonary vascular congestion. There is no focal lung infiltrate, pleural effusion or pneumothorax. Presumed external artifact overlies the right lung apex. No acute fractures are seen. IMPRESSION: Cardiomegaly with central pulmonary vascular congestion. Electronically Signed   By: Darliss Cheney M.D.   On: 03/29/2023 16:58   VAS [place] LOWER EXTREMITY VENOUS (DVT) (7a-7p) Result Date: 03/28/2023  Lower Venous DVT Study Patient Name:  TAYLYNN EASTON  Date of Exam:   03/28/2023 Medical Rec #: [id number]           Accession #:    [id number] Date of Birth: [birth year]           Patient Gender: F Patient Age:   69 years Exam Location:  [facility] Procedure:      VAS [place] LOWER EXTREMITY VENOUS (DVT) Referring Phys: [facility] --------------------------------------------------------------------------------  Indications: Swelling.  Risk Factors: None identified. Limitations: Body habitus, poor ultrasound/tissue interface and constant patient movement. Comparison Study: No prior studies. Performing Technologist: Chanda Busing RVT  Examination Guidelines: A complete evaluation includes B-mode imaging, spectral Doppler, color Doppler, and power Doppler as needed of all accessible portions of each vessel. Bilateral testing is considered an integral part of a complete examination. Limited examinations for reoccurring indications may be performed as noted. The reflux portion of the exam is performed with the patient in reverse Trendelenburg.  +---------+---------------+---------+-----------+----------+-------------------+ RIGHT    CompressibilityPhasicitySpontaneityPropertiesThrombus  Aging      +---------+---------------+---------+-----------+----------+-------------------+ CFV      Full           Yes      Yes                                      +---------+---------------+---------+-----------+----------+-------------------+ SFJ      Full                                                             +---------+---------------+---------+-----------+----------+-------------------+  FV Prox  Full                                                             +---------+---------------+---------+-----------+----------+-------------------+ FV Mid                  Yes      Yes                                      +---------+---------------+---------+-----------+----------+-------------------+ FV Distal                                             patency shown with                                                        color doppler       +---------+---------------+---------+-----------+----------+-------------------+ PFV                                                   Not well visualized +---------+---------------+---------+-----------+----------+-------------------+ POP      Full           Yes      Yes                                      +---------+---------------+---------+-----------+----------+-------------------+ PTV      Full                                                             +---------+---------------+---------+-----------+----------+-------------------+ PERO     Full                                                             +---------+---------------+---------+-----------+----------+-------------------+   +----+---------------+---------+-----------+----------+--------------+ LEFTCompressibilityPhasicitySpontaneityPropertiesThrombus Aging +----+---------------+---------+-----------+----------+--------------+ CFV Full           Yes      Yes                                  +----+---------------+---------+-----------+----------+--------------+     Summary: RIGHT: - There is no evidence of deep vein thrombosis in the lower extremity. However, portions of this examination were limited- see technologist comments above.  - No cystic structure found in the popliteal fossa.  LEFT: - No evidence of common  femoral vein obstruction.   *See table(s) above for measurements and observations. Electronically signed by Carolynn Sayers on 03/28/2023 at 1:56:34 PM.    Final    VAS [place] UPPER EXTREMITY VENOUS DUPLEX Result Date: 03/28/2023 UPPER VENOUS STUDY  Patient Name:  JENNY LAI  Date of Exam:   03/28/2023 Medical Rec #: [id number]           Accession #:    [id number] Date of Birth: 1955/03/26           Patient Gender: F Patient Age:   26 years Exam [facility]:  [facility] Procedure:      VAS [place] UPPER EXTREMITY VENOUS DUPLEX Referring Phys: Violeta Gelinas DESAI --------------------------------------------------------------------------------  Indications: Swelling Risk Factors: None identified. Limitations: Body habitus, poor ultrasound/tissue interface and patient positioning, patient movement. Comparison Study: No prior studies. Performing Technologist: Chanda Busing RVT  Examination Guidelines: A complete evaluation includes B-mode imaging, spectral Doppler, color Doppler, and power Doppler as needed of all accessible portions of each vessel. Bilateral testing is considered an integral part of a complete examination. Limited examinations for reoccurring indications may be performed as noted.  Right Findings: +----------+------------+---------+-----------+----------+-------+ RIGHT     CompressiblePhasicitySpontaneousPropertiesSummary +----------+------------+---------+-----------+----------+-------+ Subclavian    Full       Yes       Yes                      +----------+------------+---------+-----------+----------+-------+  Left Findings:  +----------+------------+---------+-----------+----------+-------+ LEFT      CompressiblePhasicitySpontaneousPropertiesSummary +----------+------------+---------+-----------+----------+-------+ IJV           Full       Yes       Yes                      +----------+------------+---------+-----------+----------+-------+ Subclavian    Full       Yes       Yes                      +----------+------------+---------+-----------+----------+-------+ Axillary      Full       Yes       Yes                      +----------+------------+---------+-----------+----------+-------+ Brachial      Full       Yes       Yes                      +----------+------------+---------+-----------+----------+-------+ Radial        Full                                          +----------+------------+---------+-----------+----------+-------+ Ulnar         Full                                          +----------+------------+---------+-----------+----------+-------+ Cephalic      Full                                          +----------+------------+---------+-----------+----------+-------+ Basilic       Full                                          +----------+------------+---------+-----------+----------+-------+  Summary:  Right: No evidence of thrombosis in the subclavian.  Left: No evidence of deep vein thrombosis in the upper extremity. No evidence of superficial vein thrombosis in the upper extremity.  *See table(s) above for measurements and observations.  Diagnosing physician: Carolynn Sayers Electronically signed by Carolynn Sayers on 03/28/2023 at 1:54:16 PM.    Final    ECHOCARDIOGRAM COMPLETE Result Date: 03/28/2023    ECHOCARDIOGRAM REPORT   Patient Name:   AHNESTI TOWNSEND Date of Exam: 03/28/2023 Medical Rec #:  [id number]          Height:       62.0 in Accession #:    [id number]         Weight:       279.1 lb Date of Birth:  [birth year]          BSA:          2.203 m  Patient Age:    67 years           BP:           127/56 mmHg Patient Gender: F                  HR:           84 bpm. Exam Location:  Inpatient Procedure: 2D Echo, Cardiac Doppler and Color Doppler Indications:    Shock R57.9  History:        Patient has prior history of Echocardiogram examinations, most                 recent 01/15/2023. Risk Factors:Diabetes and Hypertension.  Sonographer:    Darlys Gales Referring Phys: [id number] Steffanie Dunn  Sonographer Comments: Patient is obese and Technically challenging study due to limited acoustic windows. IMPRESSIONS  1. Left ventricular ejection fraction, by estimation, is 70 to 75%. The left ventricle has hyperdynamic function. The left ventricle has no regional wall motion abnormalities. Left ventricular diastolic parameters are consistent with Grade I diastolic dysfunction (impaired relaxation). There is the interventricular septum is flattened in systole and diastole, consistent with right ventricular pressure and volume overload.  2. McConnell's sign is present consistent with acute cor pulmonale, such as acute pulmonary embolism or other episode of acute increase in pulmonary vascular resistance. Right ventricular systolic function is severely reduced. The right ventricular size  is severely enlarged.  3. The mitral valve is grossly normal. No evidence of mitral valve regurgitation. No evidence of mitral stenosis.  4. The aortic valve was not well visualized. Aortic valve regurgitation is not visualized. No aortic stenosis is present. Comparison(s): Prior images reviewed side by side. The right ventricular systolic function is significantly worse. RV dilation and systolic dysfunction is a chronic abnormality. FINDINGS  Left Ventricle: Left ventricular ejection fraction, by estimation, is 70 to 75%. The left ventricle has hyperdynamic function. The left ventricle has no regional wall motion abnormalities. The left ventricular internal cavity size was small. There is  no  left ventricular hypertrophy. The interventricular septum is flattened in systole and diastole, consistent with right ventricular pressure and volume overload. Left ventricular diastolic parameters are consistent with Grade I diastolic dysfunction (impaired relaxation). Right Ventricle: McConnell's sign is present consistent with acute cor pulmonale, such as acute pulmonary embolism or other episode of acute increase in pulmonary vascular resistance. The right ventricular size is severely enlarged. No increase in right ventricular wall thickness. Right ventricular systolic function is severely reduced. Left Atrium: Left atrial size was normal in  size. Right Atrium: Right atrial size was normal in size. Pericardium: Trivial pericardial effusion is present. Mitral Valve: The mitral valve is grossly normal. No evidence of mitral valve regurgitation. No evidence of mitral valve stenosis. Tricuspid Valve: The tricuspid valve is normal in structure. Tricuspid valve regurgitation is trivial. Aortic Valve: The aortic valve was not well visualized. Aortic valve regurgitation is not visualized. No aortic stenosis is present. Pulmonic Valve: The pulmonic valve was not well visualized. Pulmonic valve regurgitation is not visualized. No evidence of pulmonic stenosis. Aorta: The aortic root was not well visualized. IAS/Shunts: The interatrial septum was not well visualized.  LEFT VENTRICLE PLAX 2D LVIDd:         4.30 cm LVIDs:         2.50 cm LV PW:         0.90 cm LV IVS:        1.00 cm LVOT diam:     1.70 cm LVOT Area:     2.27 cm  RIGHT VENTRICLE RV Basal diam:  4.60 cm RV Mid diam:    4.90 cm RV S prime:     12.50 cm/s LEFT ATRIUM           Index        RIGHT ATRIUM           Index LA Vol (A4C): 34.3 ml 15.57 ml/m  RA Area:     16.10 cm                                    RA Volume:   42.80 ml  19.43 ml/m  MV E velocity: 43.30 cm/s  TRICUSPID VALVE                            TR Peak grad:   20.6 mmHg                             TR Vmax:        227.00 cm/s                             SHUNTS                            Systemic Diam: 1.70 cm Rachelle Hora Croitoru MD Electronically signed by Thurmon Fair MD Signature Date/Time: 03/28/2023/9:34:49 AM    Final    CT ABDOMEN PELVIS WO CONTRAST Result Date: 03/27/2023 CLINICAL DATA:  Abdominal pain and confusion. EXAM: CT ABDOMEN AND PELVIS WITHOUT CONTRAST TECHNIQUE: Multidetector CT imaging of the abdomen and pelvis was performed following the standard protocol without IV contrast. RADIATION DOSE REDUCTION: This exam was performed according to the departmental dose-optimization program which includes automated exposure control, adjustment of the mA and/or kV according to patient size and/or use of iterative reconstruction technique. COMPARISON:  CT dated 10/02/2022. FINDINGS: Evaluation of this exam is limited due to respiratory motion as well as streak artifact caused by patient's arms and body habitus. Lower chest: Bibasilar streaky atelectasis/scarring. There is coronary vascular calcification. No intra-abdominal free air or free fluid. Hepatobiliary: The liver is grossly unremarkable. No biliary dilatation. The gallbladder is unremarkable. Pancreas: The pancreas is unremarkable. Spleen: Normal in size without focal abnormality. Adrenals/Urinary Tract: The  adrenal glands are unremarkable. Vascular calcification versus punctate nonobstructing right renal interpolar calculus. There is no hydronephrosis or obstructing stone on either side. The visualized ureters and urinary bladder appear unremarkable. Stomach/Bowel: There is diffuse colonic diverticulosis. There is no bowel obstruction or active inflammation. The appendix is not visualized with certainty. No inflammatory changes identified in the right lower quadrant. Vascular/Lymphatic: Moderate aortoiliac atherosclerotic disease. The IVC is unremarkable. No portal venous gas. There is no adenopathy. Vascular coil noted in the right upper  quadrant. Reproductive: Hysterectomy.  No suspicious adnexal masses. Other: None Musculoskeletal: Degenerative changes of the spine. L4-L5 fusion. No acute osseous pathology. IMPRESSION: 1. No acute intra-abdominal or pelvic pathology. 2. Colonic diverticulosis. No bowel obstruction. 3.  Aortic Atherosclerosis (ICD10-I70.0). Electronically Signed   By: Elgie Collard M.D.   On: 03/27/2023 19:39   DG Chest Portable 1 View Result Date: 03/27/2023 CLINICAL DATA:  Dyspnea EXAM: PORTABLE CHEST 1 VIEW COMPARISON:  Chest x-ray 02/15/2023 FINDINGS: The cardiomediastinal silhouette is enlarged, unchanged. The lungs are clear. There is no pleural effusion or pneumothorax. No acute fractures are seen. IMPRESSION: No active disease. Electronically Signed   By: Darliss Cheney M.D.   On: 03/27/2023 16:52   (Echo, Carotid, EGD, Colonoscopy, ERCP)    Subjective: No complaints  Discharge Exam: Vitals:   04/06/23 0410 04/06/23 0411  BP:  (!) 165/71  Pulse: 61 (!) 57  Resp: 15 19  Temp:  97.8 F (36.6 C)  SpO2: 97% 97%   Vitals:   04/05/23 2326 04/06/23 0410 04/06/23 0411 04/06/23 0500  BP:   (!) 165/71   Pulse: 67 61 (!) 57   Resp: 20 15 19    Temp:   97.8 F (36.6 C)   TempSrc:   Oral   SpO2: 95% 97% 97%   Weight:    121.1 kg  Height:        General: Pt is alert, awake, not in acute distress Cardiovascular: RRR, S1/S2 +, no rubs, no gallops Respiratory: CTA bilaterally, no wheezing, no rhonchi Abdominal: Soft, NT, ND, bowel sounds + Extremities: no edema, no cyanosis    The results of significant diagnostics from this hospitalization (including imaging, microbiology, ancillary and laboratory) are listed below for reference.     Microbiology: Recent Results (from the past 240 hours)  Resp panel by RT-PCR (RSV, Flu A&B, Covid) Anterior Nasal Swab     Status: None   Collection Time: 03/27/23  4:23 PM   Specimen: Anterior Nasal Swab  Result Value Ref Range Status   SARS Coronavirus 2 by  RT PCR NEGATIVE NEGATIVE Final    Comment: (NOTE) SARS-CoV-2 target nucleic acids are NOT DETECTED.  The SARS-CoV-2 RNA is generally detectable in upper respiratory specimens during the acute phase of infection. The lowest concentration of SARS-CoV-2 viral copies this assay can detect is 138 copies/mL. A negative result does not preclude SARS-Cov-2 infection and should not be used as the sole basis for treatment or other patient management decisions. A negative result may occur with  improper specimen collection/handling, submission of specimen other than nasopharyngeal swab, presence of viral mutation(s) within the areas targeted by this assay, and inadequate number of viral copies(<138 copies/mL). A negative result must be combined with clinical observations, patient history, and epidemiological information. The expected result is Negative.  Fact Sheet for Patients:  BloggerCourse.com  Fact Sheet for Healthcare Providers:  SeriousBroker.it  This test is no t yet approved or cleared by the [place] and  has been authorized  for detection and/or diagnosis of SARS-CoV-2 by FDA under an Emergency Use Authorization (EUA). This EUA will remain  in effect (meaning this test can be used) for the duration of the COVID-19 declaration under Section 564(b)(1) of the Act, 21 U.S.C.section 360bbb-3(b)(1), unless the authorization is terminated  or revoked sooner.       Influenza A by PCR NEGATIVE NEGATIVE Final   Influenza B by PCR NEGATIVE NEGATIVE Final    Comment: (NOTE) The Xpert Xpress SARS-CoV-2/FLU/RSV plus assay is intended as an aid in the diagnosis of influenza from Nasopharyngeal swab specimens and should not be used as a sole basis for treatment. Nasal washings and aspirates are unacceptable for Xpert Xpress SARS-CoV-2/FLU/RSV testing.  Fact Sheet for Patients: BloggerCourse.com  Fact Sheet  for Healthcare Providers: SeriousBroker.it  This test is not yet approved or cleared by the [place] FDA and has been authorized for detection and/or diagnosis of SARS-CoV-2 by FDA under an Emergency Use Authorization (EUA). This EUA will remain in effect (meaning this test can be used) for the duration of the COVID-19 declaration under Section 564(b)(1) of the Act, 21 U.S.C. section 360bbb-3(b)(1), unless the authorization is terminated or revoked.     Resp Syncytial Virus by PCR NEGATIVE NEGATIVE Final    Comment: (NOTE) Fact Sheet for Patients: BloggerCourse.com  Fact Sheet for Healthcare Providers: SeriousBroker.it  This test is not yet approved or cleared by the [place] FDA and has been authorized for detection and/or diagnosis of SARS-CoV-2 by FDA under an Emergency Use Authorization (EUA). This EUA will remain in effect (meaning this test can be used) for the duration of the COVID-19 declaration under Section 564(b)(1) of the Act, 21 U.S.C. section 360bbb-3(b)(1), unless the authorization is terminated or revoked.  Performed at [facility], [address] [place], [place] [address]   Culture, blood (Routine X 2) w Reflex to ID Panel     Status: None   Collection Time: 03/27/23  9:25 PM   Specimen: BLOOD LEFT ARM  Result Value Ref Range Status   Specimen Description   Final    BLOOD LEFT ARM Performed at [facility], [address], [place], [place] [address]    Special Requests   Final    BOTTLES DRAWN AEROBIC AND ANAEROBIC Blood Culture results may not be optimal due to an inadequate volume of blood received in culture bottles Performed at [facility], [address] [place], [place] [address]    Culture   Final    NO GROWTH 5 DAYS Performed at [facility], [address], [place], [place] [address]    Report  Status 04/02/2023 FINAL  Final  Culture, blood (Routine X 2) w Reflex to ID Panel     Status: None   Collection Time: 03/27/23  9:37 PM   Specimen: BLOOD LEFT HAND  Result Value Ref Range Status   Specimen Description   Final    BLOOD LEFT HAND Performed at [facility], [address] [place], [place] [address]    Special Requests   Final    BOTTLES DRAWN AEROBIC AND ANAEROBIC Blood Culture results may not be optimal due to an inadequate volume of blood received in culture bottles Performed at [facility], [address], [place], [place] [address]    Culture   Final    NO GROWTH 5 DAYS Performed at [facility], [address] [place], [place] [address]  Report Status 04/02/2023 FINAL  Final  SARS Coronavirus 2 by RT PCR (hospital order, performed in [facility] hospital lab) *cepheid single result test* Anterior Nasal Swab     Status: None   Collection Time: 03/28/23  7:38 AM   Specimen: Anterior Nasal Swab  Result Value Ref Range Status   SARS Coronavirus 2 by RT PCR NEGATIVE NEGATIVE Final    Comment: (NOTE) SARS-CoV-2 target nucleic acids are NOT DETECTED.  The SARS-CoV-2 RNA is generally detectable in upper and lower respiratory specimens during the acute phase of infection. The lowest concentration of SARS-CoV-2 viral copies this assay can detect is 250 copies / mL. A negative result does not preclude SARS-CoV-2 infection and should not be used as the sole basis for treatment or other patient management decisions.  A negative result may occur with improper specimen collection / handling, submission of specimen other than nasopharyngeal swab, presence of viral mutation(s) within the areas targeted by this assay, and inadequate number of viral copies (<250 copies / mL). A negative result must be combined with clinical observations, patient history, and epidemiological information.  Fact Sheet for Patients:    RoadLapTop.co.za  Fact Sheet for Healthcare Providers: http://kim-miller.com/  This test is not yet approved or  cleared by the [place] FDA and has been authorized for detection and/or diagnosis of SARS-CoV-2 by FDA under an Emergency Use Authorization (EUA).  This EUA will remain in effect (meaning this test can be used) for the duration of the COVID-19 declaration under Section 564(b)(1) of the Act, 21 U.S.C. section 360bbb-3(b)(1), unless the authorization is terminated or revoked sooner.  Performed at [facility], [address], [place], [place] [address]   MRSA Next Gen by PCR, Nasal     Status: Abnormal   Collection Time: 03/28/23  7:38 AM   Specimen: Nasal Mucosa; Nasal Swab  Result Value Ref Range Status   MRSA by PCR Next Gen DETECTED (A) NOT DETECTED Final    Comment: RESULT CALLED TO, READ BACK BY AND VERIFIED WITH: PULEO, R. RN AT 1138 ON 03/28/23 BY JM (NOTE) The GeneXpert MRSA Assay (FDA approved for NASAL specimens only), is one component of a comprehensive MRSA colonization surveillance program. It is not intended to diagnose MRSA infection nor to guide or monitor treatment for MRSA infections. Test performance is not FDA approved in patients less than 58 years old. Performed at [facility], [address] [place], [place] [address]      Labs: BNP (last 3 results) Recent Labs    01/13/23 1811 02/15/23 2105 03/27/23 1622  BNP 334.2* 432.5* 462.5*   Basic Metabolic Panel: Recent Labs  Lab 03/31/23 0324 04/01/23 0601 04/02/23 0552 04/03/23 0517 04/04/23 0521 04/05/23 0543  NA 140 142 144 145 146* 141  K 5.4* 5.2* 5.5* 4.9 4.3 4.1  CL 100 105 107 106 106 100  CO2 25 23 21* 30 29 29   GLUCOSE 79 72 104* 146* 77 84  BUN 66* 66* 64* 64* 56* 56*  CREATININE 5.85* 5.37* 4.35* 3.90* 3.22* 2.87*  CALCIUM 9.3 9.2 9.5 10.0 9.5 9.3  MG 1.5* 1.8  --   --   --   --     Liver Function Tests: No results for input(s): "AST", "ALT", "ALKPHOS", "BILITOT", "PROT", "ALBUMIN" in the last 168 hours. No results for input(s): "LIPASE", "AMYLASE" in the last 168 hours. No results for input(s): "AMMONIA" in the last 168 hours. CBC: Recent Labs  Lab 03/31/23 0324  04/01/23 0601 04/02/23 0552 04/03/23 0517 04/05/23 0543  WBC 5.5 5.1 5.2 4.8 4.7  HGB 13.1 12.3 12.5 13.3 12.4  HCT 46.3* 43.8 44.9 47.0* 43.5  MCV 85.7 84.9 83.5 82.9 84.5  PLT 257 281 253 250 219   Cardiac Enzymes: No results for input(s): "CKTOTAL", "CKMB", "CKMBINDEX", "TROPONINI" in the last 168 hours. BNP: Invalid input(s): "POCBNP" CBG: Recent Labs  Lab 04/05/23 1610 04/05/23 2011 04/06/23 0004 04/06/23 0406 04/06/23 0756  GLUCAP 106* 133* 80 84 78   D-Dimer No results for input(s): "DDIMER" in the last 72 hours. Hgb A1c No results for input(s): "HGBA1C" in the last 72 hours. Lipid Profile No results for input(s): "CHOL", "HDL", "LDLCALC", "TRIG", "CHOLHDL", "LDLDIRECT" in the last 72 hours. Thyroid function studies No results for input(s): "TSH", "T4TOTAL", "T3FREE", "THYROIDAB" in the last 72 hours.  Invalid input(s): "FREET3" Anemia work up No results for input(s): "VITAMINB12", "FOLATE", "FERRITIN", "TIBC", "IRON", "RETICCTPCT" in the last 72 hours. Urinalysis    Component Value Date/Time   COLORURINE YELLOW 03/27/2023 1717   APPEARANCEUR HAZY (A) 03/27/2023 1717   LABSPEC 1.014 03/27/2023 1717   PHURINE 5.0 03/27/2023 1717   GLUCOSEU NEGATIVE 03/27/2023 1717   HGBUR NEGATIVE 03/27/2023 1717   BILIRUBINUR NEGATIVE 03/27/2023 1717   KETONESUR NEGATIVE 03/27/2023 1717   PROTEINUR NEGATIVE 03/27/2023 1717   UROBILINOGEN 0.2 06/28/2014 1500   NITRITE NEGATIVE 03/27/2023 1717   LEUKOCYTESUR SMALL (A) 03/27/2023 1717   Sepsis Labs Recent Labs  Lab 04/01/23 0601 04/02/23 0552 04/03/23 0517 04/05/23 0543  WBC 5.1 5.2 4.8 4.7   Microbiology Recent Results (from  the past 240 hours)  Resp panel by RT-PCR (RSV, Flu A&B, Covid) Anterior Nasal Swab     Status: None   Collection Time: 03/27/23  4:23 PM   Specimen: Anterior Nasal Swab  Result Value Ref Range Status   SARS Coronavirus 2 by RT PCR NEGATIVE NEGATIVE Final    Comment: (NOTE) SARS-CoV-2 target nucleic acids are NOT DETECTED.  The SARS-CoV-2 RNA is generally detectable in upper respiratory specimens during the acute phase of infection. The lowest concentration of SARS-CoV-2 viral copies this assay can detect is 138 copies/mL. A negative result does not preclude SARS-Cov-2 infection and should not be used as the sole basis for treatment or other patient management decisions. A negative result may occur with  improper specimen collection/handling, submission of specimen other than nasopharyngeal swab, presence of viral mutation(s) within the areas targeted by this assay, and inadequate number of viral copies(<138 copies/mL). A negative result must be combined with clinical observations, patient history, and epidemiological information. The expected result is Negative.  Fact Sheet for Patients:  BloggerCourse.com  Fact Sheet for Healthcare Providers:  SeriousBroker.it  This test is no t yet approved or cleared by the [place] FDA and  has been authorized for detection and/or diagnosis of SARS-CoV-2 by FDA under an Emergency Use Authorization (EUA). This EUA will remain  in effect (meaning this test can be used) for the duration of the COVID-19 declaration under Section 564(b)(1) of the Act, 21 U.S.C.section 360bbb-3(b)(1), unless the authorization is terminated  or revoked sooner.       Influenza A by PCR NEGATIVE NEGATIVE Final   Influenza B by PCR NEGATIVE NEGATIVE Final    Comment: (NOTE) The Xpert Xpress SARS-CoV-2/FLU/RSV plus assay is intended as an aid in the diagnosis of influenza from Nasopharyngeal swab specimens  and should not be used as a sole basis for treatment. Nasal washings and aspirates  are unacceptable for Xpert Xpress SARS-CoV-2/FLU/RSV testing.  Fact Sheet for Patients: BloggerCourse.com  Fact Sheet for Healthcare Providers: SeriousBroker.it  This test is not yet approved or cleared by the [place] FDA and has been authorized for detection and/or diagnosis of SARS-CoV-2 by FDA under an Emergency Use Authorization (EUA). This EUA will remain in effect (meaning this test can be used) for the duration of the COVID-19 declaration under Section 564(b)(1) of the Act, 21 U.S.C. section 360bbb-3(b)(1), unless the authorization is terminated or revoked.     Resp Syncytial Virus by PCR NEGATIVE NEGATIVE Final    Comment: (NOTE) Fact Sheet for Patients: BloggerCourse.com  Fact Sheet for Healthcare Providers: SeriousBroker.it  This test is not yet approved or cleared by the [place] FDA and has been authorized for detection and/or diagnosis of SARS-CoV-2 by FDA under an Emergency Use Authorization (EUA). This EUA will remain in effect (meaning this test can be used) for the duration of the COVID-19 declaration under Section 564(b)(1) of the Act, 21 U.S.C. section 360bbb-3(b)(1), unless the authorization is terminated or revoked.  Performed at [facility], [address] [place], [place] [address]   Culture, blood (Routine X 2) w Reflex to ID Panel     Status: None   Collection Time: 03/27/23  9:25 PM   Specimen: BLOOD LEFT ARM  Result Value Ref Range Status   Specimen Description   Final    BLOOD LEFT ARM Performed at [facility], [address], [place], [place] [address]    Special Requests   Final    BOTTLES DRAWN AEROBIC AND ANAEROBIC Blood Culture results may not be optimal due to an inadequate volume of blood received in  culture bottles Performed at [facility], [address] [place], [place] [address]    Culture   Final    NO GROWTH 5 DAYS Performed at [facility], [address] [place], [place] [address]    Report Status 04/02/2023 FINAL  Final  Culture, blood (Routine X 2) w Reflex to ID Panel     Status: None   Collection Time: 03/27/23  9:37 PM   Specimen: BLOOD LEFT HAND  Result Value Ref Range Status   Specimen Description   Final    BLOOD LEFT HAND Performed at [facility], [address], [place], [place] [address]    Special Requests   Final    BOTTLES DRAWN AEROBIC AND ANAEROBIC Blood Culture results may not be optimal due to an inadequate volume of blood received in culture bottles Performed at [facility], [address], [place], [place] [address]    Culture   Final    NO GROWTH 5 DAYS Performed at [facility], [address] [place], [place] [address]    Report Status 04/02/2023 FINAL  Final  SARS Coronavirus 2 by RT PCR (hospital order, performed in [facility] hospital lab) *cepheid single result test* Anterior Nasal Swab     Status: None   Collection Time: 03/28/23  7:38 AM   Specimen: Anterior Nasal Swab  Result Value Ref Range Status   SARS Coronavirus 2 by RT PCR NEGATIVE NEGATIVE Final    Comment: (NOTE) SARS-CoV-2 target nucleic acids are NOT DETECTED.  The SARS-CoV-2 RNA is generally detectable in upper and lower respiratory specimens during the acute phase of infection. The lowest concentration of SARS-CoV-2 viral copies this assay can detect is 250 copies / mL. A  negative result does not preclude SARS-CoV-2 infection and should not be used as the sole basis for treatment or other patient management decisions.  A negative result may occur with improper specimen collection / handling, submission of specimen other than nasopharyngeal swab, presence of viral mutation(s) within the areas  targeted by this assay, and inadequate number of viral copies (<250 copies / mL). A negative result must be combined with clinical observations, patient history, and epidemiological information.  Fact Sheet for Patients:   RoadLapTop.co.za  Fact Sheet for Healthcare Providers: http://kim-miller.com/  This test is not yet approved or  cleared by the [place] FDA and has been authorized for detection and/or diagnosis of SARS-CoV-2 by FDA under an Emergency Use Authorization (EUA).  This EUA will remain in effect (meaning this test can be used) for the duration of the COVID-19 declaration under Section 564(b)(1) of the Act, 21 U.S.C. section 360bbb-3(b)(1), unless the authorization is terminated or revoked sooner.  Performed at [facility], [address], [place], [place] [address]   MRSA Next Gen by PCR, Nasal     Status: Abnormal   Collection Time: 03/28/23  7:38 AM   Specimen: Nasal Mucosa; Nasal Swab  Result Value Ref Range Status   MRSA by PCR Next Gen DETECTED (A) NOT DETECTED Final    Comment: RESULT CALLED TO, READ BACK BY AND VERIFIED WITH: PULEO, R. RN AT 1138 ON 03/28/23 BY JM (NOTE) The GeneXpert MRSA Assay (FDA approved for NASAL specimens only), is one component of a comprehensive MRSA colonization surveillance program. It is not intended to diagnose MRSA infection nor to guide or monitor treatment for MRSA infections. Test performance is not FDA approved in patients less than 25 years old. Performed at [facility], [address], [place], [place] [address]      Time coordinating discharge: Over 30 minutes  SIGNED:   Marinda Elk, MD  [facility] 04/06/2023, 8:13 AM Pager   If 7PM-7AM, please contact night-coverage [email or web address] Password [email or web address]

## 2023-06-02 DIAGNOSIS — J449 Chronic obstructive pulmonary disease, unspecified: Secondary | ICD-10-CM | POA: Diagnosis not present

## 2023-10-27 ENCOUNTER — Telehealth: Payer: Self-pay

## 2023-10-27 NOTE — Telephone Encounter (Signed)
 Patient was identified as falling into the [place] Measure - Diabetes.   Patient was: Patient is not currently using our practice.

## 2023-11-15 ENCOUNTER — Telehealth: Payer: Self-pay

## 2023-11-15 NOTE — Telephone Encounter (Signed)
 Patient was identified as falling into the [place] Measure - Diabetes.   Patient was: Patient is not currently using our practice.

## 2023-11-29 ENCOUNTER — Emergency Department (HOSPITAL_COMMUNITY)

## 2023-11-29 ENCOUNTER — Inpatient Hospital Stay (HOSPITAL_COMMUNITY)
Admission: EM | Admit: 2023-11-29 | Discharge: 2023-12-28 | DRG: 286 | Disposition: A | Discharge status: Hospice | Attending: Critical Care Medicine | Admitting: Critical Care Medicine

## 2023-11-29 DIAGNOSIS — Z79899 Other long term (current) drug therapy: Secondary | ICD-10-CM

## 2023-11-29 DIAGNOSIS — Z833 Family history of diabetes mellitus: Secondary | ICD-10-CM

## 2023-11-29 DIAGNOSIS — I5023 Acute on chronic systolic (congestive) heart failure: Principal | ICD-10-CM

## 2023-11-29 DIAGNOSIS — J9622 Acute and chronic respiratory failure with hypercapnia: Secondary | ICD-10-CM | POA: Diagnosis present

## 2023-11-29 DIAGNOSIS — N61 Mastitis without abscess: Secondary | ICD-10-CM | POA: Diagnosis present

## 2023-11-29 DIAGNOSIS — J441 Chronic obstructive pulmonary disease with (acute) exacerbation: Secondary | ICD-10-CM | POA: Insufficient documentation

## 2023-11-29 DIAGNOSIS — E11649 Type 2 diabetes mellitus with hypoglycemia without coma: Secondary | ICD-10-CM | POA: Diagnosis present

## 2023-11-29 DIAGNOSIS — E8809 Other disorders of plasma-protein metabolism, not elsewhere classified: Secondary | ICD-10-CM | POA: Diagnosis present

## 2023-11-29 DIAGNOSIS — N184 Chronic kidney disease, stage 4 (severe): Secondary | ICD-10-CM | POA: Diagnosis not present

## 2023-11-29 DIAGNOSIS — I50810 Right heart failure, unspecified: Secondary | ICD-10-CM | POA: Diagnosis not present

## 2023-11-29 DIAGNOSIS — I132 Hypertensive heart and chronic kidney disease with heart failure and with stage 5 chronic kidney disease, or end stage renal disease: Principal | ICD-10-CM | POA: Diagnosis present

## 2023-11-29 DIAGNOSIS — Z66 Do not resuscitate: Secondary | ICD-10-CM | POA: Diagnosis present

## 2023-11-29 DIAGNOSIS — Z6841 Body Mass Index (BMI) 40.0 and over, adult: Secondary | ICD-10-CM

## 2023-11-29 DIAGNOSIS — J189 Pneumonia, unspecified organism: Secondary | ICD-10-CM | POA: Diagnosis not present

## 2023-11-29 DIAGNOSIS — E1165 Type 2 diabetes mellitus with hyperglycemia: Secondary | ICD-10-CM | POA: Diagnosis present

## 2023-11-29 DIAGNOSIS — I5031 Acute diastolic (congestive) heart failure: Secondary | ICD-10-CM | POA: Diagnosis not present

## 2023-11-29 DIAGNOSIS — J9621 Acute and chronic respiratory failure with hypoxia: Secondary | ICD-10-CM | POA: Diagnosis present

## 2023-11-29 DIAGNOSIS — N828 Other female genital tract fistulae: Secondary | ICD-10-CM

## 2023-11-29 DIAGNOSIS — L89156 Pressure-induced deep tissue damage of sacral region: Secondary | ICD-10-CM | POA: Diagnosis present

## 2023-11-29 DIAGNOSIS — G4733 Obstructive sleep apnea (adult) (pediatric): Secondary | ICD-10-CM | POA: Diagnosis present

## 2023-11-29 DIAGNOSIS — Z91040 Latex allergy status: Secondary | ICD-10-CM

## 2023-11-29 DIAGNOSIS — E66813 Obesity, class 3: Secondary | ICD-10-CM | POA: Diagnosis present

## 2023-11-29 DIAGNOSIS — D72829 Elevated white blood cell count, unspecified: Secondary | ICD-10-CM | POA: Diagnosis not present

## 2023-11-29 DIAGNOSIS — L89316 Pressure-induced deep tissue damage of right buttock: Secondary | ICD-10-CM | POA: Diagnosis not present

## 2023-11-29 DIAGNOSIS — E1122 Type 2 diabetes mellitus with diabetic chronic kidney disease: Secondary | ICD-10-CM | POA: Diagnosis present

## 2023-11-29 DIAGNOSIS — R197 Diarrhea, unspecified: Secondary | ICD-10-CM | POA: Diagnosis not present

## 2023-11-29 DIAGNOSIS — I5033 Acute on chronic diastolic (congestive) heart failure: Secondary | ICD-10-CM | POA: Diagnosis present

## 2023-11-29 DIAGNOSIS — T426X5A Adverse effect of other antiepileptic and sedative-hypnotic drugs, initial encounter: Secondary | ICD-10-CM | POA: Diagnosis not present

## 2023-11-29 DIAGNOSIS — I129 Hypertensive chronic kidney disease with stage 1 through stage 4 chronic kidney disease, or unspecified chronic kidney disease: Secondary | ICD-10-CM | POA: Diagnosis not present

## 2023-11-29 DIAGNOSIS — D696 Thrombocytopenia, unspecified: Secondary | ICD-10-CM | POA: Diagnosis present

## 2023-11-29 DIAGNOSIS — J90 Pleural effusion, not elsewhere classified: Secondary | ICD-10-CM | POA: Diagnosis not present

## 2023-11-29 DIAGNOSIS — D631 Anemia in chronic kidney disease: Secondary | ICD-10-CM | POA: Diagnosis present

## 2023-11-29 DIAGNOSIS — F32A Depression, unspecified: Secondary | ICD-10-CM | POA: Diagnosis present

## 2023-11-29 DIAGNOSIS — N179 Acute kidney failure, unspecified: Secondary | ICD-10-CM | POA: Diagnosis present

## 2023-11-29 DIAGNOSIS — A419 Sepsis, unspecified organism: Secondary | ICD-10-CM | POA: Diagnosis not present

## 2023-11-29 DIAGNOSIS — I272 Pulmonary hypertension, unspecified: Secondary | ICD-10-CM | POA: Diagnosis not present

## 2023-11-29 DIAGNOSIS — J9601 Acute respiratory failure with hypoxia: Secondary | ICD-10-CM | POA: Diagnosis present

## 2023-11-29 DIAGNOSIS — E662 Morbid (severe) obesity with alveolar hypoventilation: Secondary | ICD-10-CM | POA: Diagnosis present

## 2023-11-29 DIAGNOSIS — I5082 Biventricular heart failure: Secondary | ICD-10-CM | POA: Diagnosis present

## 2023-11-29 DIAGNOSIS — G928 Other toxic encephalopathy: Secondary | ICD-10-CM | POA: Diagnosis not present

## 2023-11-29 DIAGNOSIS — E1140 Type 2 diabetes mellitus with diabetic neuropathy, unspecified: Secondary | ICD-10-CM | POA: Diagnosis present

## 2023-11-29 DIAGNOSIS — R410 Disorientation, unspecified: Secondary | ICD-10-CM | POA: Diagnosis not present

## 2023-11-29 DIAGNOSIS — E440 Moderate protein-calorie malnutrition: Secondary | ICD-10-CM | POA: Diagnosis present

## 2023-11-29 DIAGNOSIS — M7989 Other specified soft tissue disorders: Secondary | ICD-10-CM | POA: Diagnosis present

## 2023-11-29 DIAGNOSIS — R609 Edema, unspecified: Secondary | ICD-10-CM | POA: Diagnosis not present

## 2023-11-29 DIAGNOSIS — R570 Cardiogenic shock: Secondary | ICD-10-CM | POA: Diagnosis not present

## 2023-11-29 DIAGNOSIS — G9341 Metabolic encephalopathy: Secondary | ICD-10-CM | POA: Diagnosis not present

## 2023-11-29 DIAGNOSIS — E871 Hypo-osmolality and hyponatremia: Secondary | ICD-10-CM | POA: Diagnosis present

## 2023-11-29 DIAGNOSIS — I2723 Pulmonary hypertension due to lung diseases and hypoxia: Secondary | ICD-10-CM | POA: Diagnosis present

## 2023-11-29 DIAGNOSIS — I50813 Acute on chronic right heart failure: Secondary | ICD-10-CM | POA: Diagnosis not present

## 2023-11-29 DIAGNOSIS — F419 Anxiety disorder, unspecified: Secondary | ICD-10-CM | POA: Diagnosis present

## 2023-11-29 DIAGNOSIS — Z885 Allergy status to narcotic agent status: Secondary | ICD-10-CM

## 2023-11-29 DIAGNOSIS — J440 Chronic obstructive pulmonary disease with acute lower respiratory infection: Secondary | ICD-10-CM | POA: Diagnosis not present

## 2023-11-29 DIAGNOSIS — Z794 Long term (current) use of insulin: Secondary | ICD-10-CM | POA: Diagnosis not present

## 2023-11-29 DIAGNOSIS — Z888 Allergy status to other drugs, medicaments and biological substances status: Secondary | ICD-10-CM

## 2023-11-29 DIAGNOSIS — N170 Acute kidney failure with tubular necrosis: Secondary | ICD-10-CM | POA: Diagnosis not present

## 2023-11-29 DIAGNOSIS — Z992 Dependence on renal dialysis: Secondary | ICD-10-CM

## 2023-11-29 DIAGNOSIS — Z7401 Bed confinement status: Secondary | ICD-10-CM

## 2023-11-29 DIAGNOSIS — N185 Chronic kidney disease, stage 5: Secondary | ICD-10-CM | POA: Diagnosis present

## 2023-11-29 DIAGNOSIS — I9589 Other hypotension: Secondary | ICD-10-CM | POA: Diagnosis present

## 2023-11-29 DIAGNOSIS — E119 Type 2 diabetes mellitus without complications: Secondary | ICD-10-CM

## 2023-11-29 DIAGNOSIS — R739 Hyperglycemia, unspecified: Secondary | ICD-10-CM | POA: Diagnosis not present

## 2023-11-29 DIAGNOSIS — N1832 Chronic kidney disease, stage 3b: Secondary | ICD-10-CM | POA: Diagnosis not present

## 2023-11-29 DIAGNOSIS — F05 Delirium due to known physiological condition: Secondary | ICD-10-CM | POA: Diagnosis not present

## 2023-11-29 DIAGNOSIS — T380X5A Adverse effect of glucocorticoids and synthetic analogues, initial encounter: Secondary | ICD-10-CM | POA: Diagnosis present

## 2023-11-29 DIAGNOSIS — E874 Mixed disorder of acid-base balance: Secondary | ICD-10-CM | POA: Diagnosis present

## 2023-11-29 DIAGNOSIS — E1169 Type 2 diabetes mellitus with other specified complication: Secondary | ICD-10-CM | POA: Diagnosis not present

## 2023-11-29 DIAGNOSIS — J9620 Acute and chronic respiratory failure, unspecified whether with hypoxia or hypercapnia: Secondary | ICD-10-CM | POA: Diagnosis not present

## 2023-11-29 DIAGNOSIS — Z880 Allergy status to penicillin: Secondary | ICD-10-CM

## 2023-11-29 DIAGNOSIS — I2721 Secondary pulmonary arterial hypertension: Secondary | ICD-10-CM | POA: Diagnosis present

## 2023-11-29 DIAGNOSIS — E8779 Other fluid overload: Secondary | ICD-10-CM | POA: Diagnosis not present

## 2023-11-29 DIAGNOSIS — Z515 Encounter for palliative care: Secondary | ICD-10-CM | POA: Diagnosis not present

## 2023-11-29 DIAGNOSIS — E7849 Other hyperlipidemia: Secondary | ICD-10-CM | POA: Diagnosis present

## 2023-11-29 DIAGNOSIS — I2781 Cor pulmonale (chronic): Secondary | ICD-10-CM | POA: Diagnosis present

## 2023-11-29 DIAGNOSIS — H5702 Anisocoria: Secondary | ICD-10-CM | POA: Diagnosis not present

## 2023-11-29 DIAGNOSIS — Z7951 Long term (current) use of inhaled steroids: Secondary | ICD-10-CM

## 2023-11-29 DIAGNOSIS — I878 Other specified disorders of veins: Secondary | ICD-10-CM | POA: Diagnosis present

## 2023-11-29 DIAGNOSIS — I4891 Unspecified atrial fibrillation: Secondary | ICD-10-CM | POA: Diagnosis present

## 2023-11-29 DIAGNOSIS — E1151 Type 2 diabetes mellitus with diabetic peripheral angiopathy without gangrene: Secondary | ICD-10-CM | POA: Diagnosis present

## 2023-11-29 DIAGNOSIS — D649 Anemia, unspecified: Secondary | ICD-10-CM | POA: Diagnosis not present

## 2023-11-29 DIAGNOSIS — E785 Hyperlipidemia, unspecified: Secondary | ICD-10-CM | POA: Diagnosis not present

## 2023-11-29 DIAGNOSIS — Z7189 Other specified counseling: Secondary | ICD-10-CM

## 2023-11-29 DIAGNOSIS — I1310 Hypertensive heart and chronic kidney disease without heart failure, with stage 1 through stage 4 chronic kidney disease, or unspecified chronic kidney disease: Secondary | ICD-10-CM

## 2023-11-29 DIAGNOSIS — Z9981 Dependence on supplemental oxygen: Secondary | ICD-10-CM

## 2023-11-29 DIAGNOSIS — L89326 Pressure-induced deep tissue damage of left buttock: Secondary | ICD-10-CM | POA: Diagnosis not present

## 2023-11-29 DIAGNOSIS — Z87891 Personal history of nicotine dependence: Secondary | ICD-10-CM

## 2023-11-29 DIAGNOSIS — E876 Hypokalemia: Secondary | ICD-10-CM | POA: Diagnosis not present

## 2023-11-29 LAB — BLOOD GAS, VENOUS
Acid-Base Excess: 7.3 mmol/L — ABNORMAL HIGH (ref 0.0–2.0)
Bicarbonate: 36.3 mmol/L — ABNORMAL HIGH (ref 20.0–28.0)
O2 Saturation: 77.6 %
Patient temperature: 37
pCO2, Ven: 72 mmHg (ref 44–60)
pH, Ven: 7.31 (ref 7.25–7.43)
pO2, Ven: 49 mmHg — ABNORMAL HIGH (ref 32–45)

## 2023-11-29 LAB — GLUCOSE, CAPILLARY: Glucose-Capillary: 110 mg/dL — ABNORMAL HIGH (ref 70–99)

## 2023-11-29 LAB — CBC WITH DIFFERENTIAL/PLATELET
Abs Immature Granulocytes: 0.03 K/uL (ref 0.00–0.07)
Basophils Absolute: 0 K/uL (ref 0.0–0.1)
Basophils Relative: 1 %
Eosinophils Absolute: 0.4 K/uL (ref 0.0–0.5)
Eosinophils Relative: 7 %
HCT: 48.5 % — ABNORMAL HIGH (ref 36.0–46.0)
Hemoglobin: 12.9 g/dL (ref 12.0–15.0)
Immature Granulocytes: 1 %
Lymphocytes Relative: 26 %
Lymphs Abs: 1.5 K/uL (ref 0.7–4.0)
MCH: 21.2 pg — ABNORMAL LOW (ref 26.0–34.0)
MCHC: 26.6 g/dL — ABNORMAL LOW (ref 30.0–36.0)
MCV: 79.8 fL — ABNORMAL LOW (ref 80.0–100.0)
Monocytes Absolute: 0.6 K/uL (ref 0.1–1.0)
Monocytes Relative: 11 %
Neutro Abs: 3.2 K/uL (ref 1.7–7.7)
Neutrophils Relative %: 54 %
Platelets: 272 K/uL (ref 150–400)
RBC: 6.08 MIL/uL — ABNORMAL HIGH (ref 3.87–5.11)
RDW: 22.3 % — ABNORMAL HIGH (ref 11.5–15.5)
Smear Review: NORMAL
WBC: 5.8 K/uL (ref 4.0–10.5)
nRBC: 0 % (ref 0.0–0.2)

## 2023-11-29 LAB — PRO BRAIN NATRIURETIC PEPTIDE: Pro Brain Natriuretic Peptide: 1820 pg/mL — ABNORMAL HIGH (ref <300.0)

## 2023-11-29 LAB — COMPREHENSIVE METABOLIC PANEL WITH GFR
ALT: 10 U/L (ref 0–44)
AST: 15 U/L (ref 15–41)
Albumin: 3.9 g/dL (ref 3.5–5.0)
Alkaline Phosphatase: 210 U/L — ABNORMAL HIGH (ref 38–126)
Anion gap: 10 (ref 5–15)
BUN: 29 mg/dL — ABNORMAL HIGH (ref 8–23)
CO2: 31 mmol/L (ref 22–32)
Calcium: 9.9 mg/dL (ref 8.9–10.3)
Chloride: 102 mmol/L (ref 98–111)
Creatinine, Ser: 1.94 mg/dL — ABNORMAL HIGH (ref 0.44–1.00)
GFR, Estimated: 27 mL/min — ABNORMAL LOW (ref >60)
Glucose, Bld: 97 mg/dL (ref 70–99)
Potassium: 4.7 mmol/L (ref 3.5–5.1)
Sodium: 143 mmol/L (ref 135–145)
Total Bilirubin: 0.6 mg/dL (ref 0.0–1.2)
Total Protein: 7.4 g/dL (ref 6.5–8.1)

## 2023-11-29 LAB — TROPONIN T, HIGH SENSITIVITY: Troponin T High Sensitivity: 39 ng/L — ABNORMAL HIGH (ref 0–19)

## 2023-11-29 LAB — HEMOGLOBIN A1C
Hgb A1c MFr Bld: 8.8 % — ABNORMAL HIGH (ref 4.8–5.6)
Mean Plasma Glucose: 205.86 mg/dL

## 2023-11-29 LAB — MRSA NEXT GEN BY PCR, NASAL: MRSA by PCR Next Gen: DETECTED — AB

## 2023-11-29 MED ORDER — ACETAMINOPHEN 325 MG PO TABS
650.0000 mg | ORAL_TABLET | Freq: Four times a day (QID) | ORAL | Status: DC | PRN
  Administered 2023-11-30 – 2023-12-21 (×6): 650 mg via ORAL
  Filled 2023-11-29 (×6): qty 2

## 2023-11-29 MED ORDER — HEPARIN SODIUM (PORCINE) 5000 UNIT/ML IJ SOLN
5000.0000 [IU] | Freq: Three times a day (TID) | INTRAMUSCULAR | Status: DC
  Administered 2023-11-29 – 2023-12-16 (×47): 5000 [IU] via SUBCUTANEOUS
  Filled 2023-11-29 (×47): qty 1

## 2023-11-29 MED ORDER — CHLORHEXIDINE GLUCONATE CLOTH 2 % EX PADS
6.0000 | MEDICATED_PAD | Freq: Every day | CUTANEOUS | Status: DC
  Administered 2023-11-29: 6 via TOPICAL

## 2023-11-29 MED ORDER — ONDANSETRON HCL 4 MG PO TABS: 4.0000 mg | ORAL_TABLET | Freq: Four times a day (QID) | ORAL | Status: DC | PRN

## 2023-11-29 MED ORDER — INSULIN GLARGINE 100 UNIT/ML ~~LOC~~ SOLN
50.0000 [IU] | Freq: Every day | SUBCUTANEOUS | Status: DC
Start: 2023-11-30 — End: 2023-12-03
  Administered 2023-11-30 – 2023-12-02 (×3): 50 [IU] via SUBCUTANEOUS
  Filled 2023-11-29 (×4): qty 0.5

## 2023-11-29 MED ORDER — FUROSEMIDE 10 MG/ML IJ SOLN: 40.0000 mg | Freq: Every day | INTRAMUSCULAR | Status: DC

## 2023-11-29 MED ORDER — TRAZODONE HCL 50 MG PO TABS
25.0000 mg | ORAL_TABLET | Freq: Every evening | ORAL | Status: DC | PRN
  Administered 2023-12-02 – 2023-12-25 (×8): 25 mg via ORAL
  Filled 2023-11-29 (×8): qty 1

## 2023-11-29 MED ORDER — INSULIN ASPART 100 UNIT/ML IJ SOLN
0.0000 [IU] | Freq: Every day | INTRAMUSCULAR | Status: DC
  Administered 2023-12-05 – 2023-12-07 (×3): 2 [IU] via SUBCUTANEOUS
  Administered 2023-12-08: 3 [IU] via SUBCUTANEOUS
  Administered 2023-12-09: 4 [IU] via SUBCUTANEOUS
  Administered 2023-12-10 – 2023-12-11 (×2): 3 [IU] via SUBCUTANEOUS
  Administered 2023-12-13: 2 [IU] via SUBCUTANEOUS
  Administered 2023-12-14 – 2023-12-25 (×2): 3 [IU] via SUBCUTANEOUS
  Filled 2023-11-29: qty 0.05
  Filled 2023-11-29: qty 4

## 2023-11-29 MED ORDER — INSULIN ASPART 100 UNIT/ML IJ SOLN
0.0000 [IU] | Freq: Three times a day (TID) | INTRAMUSCULAR | Status: DC
  Administered 2023-11-30 (×2): 3 [IU] via SUBCUTANEOUS
  Administered 2023-12-01: 4 [IU] via SUBCUTANEOUS
  Administered 2023-12-01 – 2023-12-02 (×3): 3 [IU] via SUBCUTANEOUS
  Administered 2023-12-05 (×3): 4 [IU] via SUBCUTANEOUS
  Administered 2023-12-06 – 2023-12-07 (×4): 7 [IU] via SUBCUTANEOUS
  Administered 2023-12-07 – 2023-12-08 (×4): 11 [IU] via SUBCUTANEOUS
  Administered 2023-12-08 – 2023-12-09 (×2): 7 [IU] via SUBCUTANEOUS
  Administered 2023-12-09: 11 [IU] via SUBCUTANEOUS
  Administered 2023-12-09: 15 [IU] via SUBCUTANEOUS
  Administered 2023-12-10: 11 [IU] via SUBCUTANEOUS
  Administered 2023-12-10: 15 [IU] via SUBCUTANEOUS
  Administered 2023-12-10: 4 [IU] via SUBCUTANEOUS
  Administered 2023-12-11 (×3): 7 [IU] via SUBCUTANEOUS
  Administered 2023-12-12: 4 [IU] via SUBCUTANEOUS
  Administered 2023-12-12: 3 [IU] via SUBCUTANEOUS
  Administered 2023-12-13: 7 [IU] via SUBCUTANEOUS
  Administered 2023-12-13: 3 [IU] via SUBCUTANEOUS
  Administered 2023-12-14: 7 [IU] via SUBCUTANEOUS
  Administered 2023-12-14: 11 [IU] via SUBCUTANEOUS
  Administered 2023-12-14: 4 [IU] via SUBCUTANEOUS
  Administered 2023-12-15 (×2): 7 [IU] via SUBCUTANEOUS
  Administered 2023-12-15: 4 [IU] via SUBCUTANEOUS
  Administered 2023-12-16 (×2): 7 [IU] via SUBCUTANEOUS
  Administered 2023-12-16: 4 [IU] via SUBCUTANEOUS
  Administered 2023-12-17: 7 [IU] via SUBCUTANEOUS
  Administered 2023-12-17 – 2023-12-18 (×2): 3 [IU] via SUBCUTANEOUS
  Administered 2023-12-18 (×2): 4 [IU] via SUBCUTANEOUS
  Administered 2023-12-19: 3 [IU] via SUBCUTANEOUS
  Administered 2023-12-19 – 2023-12-20 (×5): 4 [IU] via SUBCUTANEOUS
  Administered 2023-12-21: 3 [IU] via SUBCUTANEOUS
  Administered 2023-12-21 – 2023-12-23 (×3): 4 [IU] via SUBCUTANEOUS
  Administered 2023-12-23: 3 [IU] via SUBCUTANEOUS
  Administered 2023-12-23: 4 [IU] via SUBCUTANEOUS
  Administered 2023-12-24 (×2): 7 [IU] via SUBCUTANEOUS
  Administered 2023-12-24: 4 [IU] via SUBCUTANEOUS
  Administered 2023-12-25: 7 [IU] via SUBCUTANEOUS
  Administered 2023-12-25: 4 [IU] via SUBCUTANEOUS
  Administered 2023-12-25 – 2023-12-27 (×5): 7 [IU] via SUBCUTANEOUS
  Administered 2023-12-27 – 2023-12-28 (×3): 4 [IU] via SUBCUTANEOUS
  Administered 2023-12-28: 7 [IU] via SUBCUTANEOUS
  Filled 2023-11-29: qty 7
  Filled 2023-11-29: qty 4
  Filled 2023-11-29: qty 7
  Filled 2023-11-29: qty 0.2
  Filled 2023-11-29: qty 4
  Filled 2023-11-29: qty 7

## 2023-11-29 MED ORDER — FUROSEMIDE 10 MG/ML IJ SOLN
60.0000 mg | Freq: Once | INTRAMUSCULAR | Status: AC
  Administered 2023-11-29: 60 mg via INTRAVENOUS
  Filled 2023-11-29: qty 8

## 2023-11-29 MED ORDER — ALBUTEROL SULFATE (2.5 MG/3ML) 0.083% IN NEBU: 2.5000 mg | INHALATION_SOLUTION | RESPIRATORY_TRACT | Status: DC | PRN

## 2023-11-29 MED ORDER — ONDANSETRON HCL 4 MG/2ML IJ SOLN: 4.0000 mg | Freq: Four times a day (QID) | INTRAMUSCULAR | Status: DC | PRN

## 2023-11-29 MED ORDER — ATORVASTATIN CALCIUM 10 MG PO TABS
10.0000 mg | ORAL_TABLET | Freq: Every day | ORAL | Status: DC
  Administered 2023-11-30 – 2023-12-27 (×26): 10 mg via ORAL
  Filled 2023-11-29 (×27): qty 1

## 2023-11-29 MED ORDER — ORAL CARE MOUTH RINSE
15.0000 mL | OROMUCOSAL | Status: DC | PRN
Start: 2023-11-29 — End: 2023-12-01

## 2023-11-29 MED ORDER — ACETAMINOPHEN 650 MG RE SUPP: 650.0000 mg | Freq: Four times a day (QID) | RECTAL | Status: DC | PRN

## 2023-11-29 MED ORDER — POTASSIUM CHLORIDE CRYS ER 20 MEQ PO TBCR
20.0000 meq | EXTENDED_RELEASE_TABLET | Freq: Every day | ORAL | Status: DC
  Administered 2023-12-01 – 2023-12-06 (×6): 20 meq via ORAL
  Filled 2023-11-29 (×6): qty 1

## 2023-11-29 MED ORDER — OXYCODONE HCL 5 MG PO TABS
5.0000 mg | ORAL_TABLET | ORAL | Status: DC | PRN
  Administered 2023-11-30 – 2023-12-12 (×6): 5 mg via ORAL
  Filled 2023-11-29 (×7): qty 1

## 2023-11-29 MED ORDER — ORAL CARE MOUTH RINSE
15.0000 mL | OROMUCOSAL | Status: DC
  Administered 2023-11-30 – 2023-12-01 (×5): 15 mL via OROMUCOSAL
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
## 2023-11-29 NOTE — ED Provider Notes (Signed)
 [place] EMERGENCY DEPARTMENT AT [facility] Provider Note   CSN: [id number] Arrival date & time: 11/29/23  1149     Patient presents with: generalied edema    [name] [name] is a 68 y.o. female.   68 yo F with a cc of swelling.  Going on for about 2 weeks.  She said that she was not feeling well at the onset.  She has not really been able to get up as she normally does.  Does not walk normally but is able to it sounds like stand and pivot.  She denies cough congestion or fever.  Denies abdominal pain nausea vomiting or diarrhea.  She complains that her left breast is exceptionally swollen.        Prior to Admission medications   Medication Sig Start Date End Date Taking? Authorizing Provider  atorvastatin  (LIPITOR) 10 MG tablet Take 10 mg by mouth at bedtime.    [provider]  Insulin  Glargine Solostar (LANTUS ) 100 UNIT/ML Solostar Pen Inject 35 Units into the skin in the morning.    [provider]  melatonin 3 MG TABS tablet Take 3 mg by mouth at bedtime. 11/22/22   [provider]  midodrine  (PROAMATINE ) 5 MG tablet Take 0.5 tablets (2.5 mg total) by mouth 2 (two) times daily with a meal. 04/06/23   Odell Celinda Balo, MD  ondansetron  (ZOFRAN ) 8 MG tablet Take 4 mg by mouth every 8 (eight) hours as needed for nausea or vomiting.    [provider]  potassium chloride  SA (KLOR-CON  M) 20 MEQ tablet Take 1 tablet (20 mEq total) by mouth daily. 02/23/23   Fairy Frames, MD  Semaglutide, 2 MG/DOSE, 8 MG/3ML SOPN Inject 2 mg into the skin every Thursday. 10/20/22   [provider]  sertraline (ZOLOFT) 50 MG tablet Take 50 mg by mouth in the morning.    [provider]  torsemide  (DEMADEX ) 20 MG tablet Take 2 tablets (40 mg total) by mouth 2 (two) times daily. 02/23/23   Fairy Frames, MD    Allergies: Lisinopril, Morphine , Penicillins, Metformin, and Sulfa antibiotics    Review of Systems  Updated Vital  Signs BP (!) 166/83 (BP Location: Right Arm) Comment (BP Location): taken below [facility] because of signifigant swelling of upper arm  Pulse 92   Temp 97.7 F (36.5 C)   Resp 16   Ht 5' 2.5 (1.588 m)   Wt 121.1 kg   SpO2 99%   BMI 48.05 kg/m   Physical Exam Vitals and nursing note reviewed.  Constitutional:      General: She is not in acute distress.    Appearance: She is well-developed. She is not diaphoretic.  HENT:     Head: Normocephalic and atraumatic.  Eyes:     Pupils: Pupils are equal, round, and reactive to light.  Cardiovascular:     Rate and Rhythm: Normal rate and regular rhythm.     Heart sounds: No murmur heard.    No friction rub. No gallop.  Pulmonary:     Effort: Pulmonary effort is normal.     Breath sounds: No wheezing or rales.  Chest:     Comments: Difficult to assess edema based on body habitus, left breast does appear to be a bit more edematous than the right.  No obvious erythema or warmth. There is some skin breakdown  in the skin folds. Abdominal:     General: There is no distension.     Palpations: Abdomen  is soft.     Tenderness: There is no abdominal tenderness.  Musculoskeletal:        General: No tenderness.     Cervical back: Normal range of motion and neck supple.  Skin:    General: Skin is warm and dry.  Neurological:     Mental Status: She is alert and oriented to person, place, and time.  Psychiatric:        Behavior: Behavior normal.     (all labs ordered are listed, but only abnormal results are displayed) Labs Reviewed  CBC WITH DIFFERENTIAL/PLATELET - Abnormal; Notable for the following components:      Result Value   RBC 6.08 (*)    HCT 48.5 (*)    MCV 79.8 (*)    MCH 21.2 (*)    MCHC 26.6 (*)    RDW 22.3 (*)    All other components within normal limits  BLOOD GAS, VENOUS - Abnormal; Notable for the following components:   pCO2, Ven 72 (*)    pO2, Ven 49 (*)    Bicarbonate 36.3 (*)    Acid-Base Excess 7.3 (*)    All  other components within normal limits  COMPREHENSIVE METABOLIC PANEL WITH GFR  PRO BRAIN NATRIURETIC PEPTIDE  TROPONIN T, HIGH SENSITIVITY    EKG: None  Radiology: [facility] Chest Port 1 View Result Date: 11/29/2023 CLINICAL DATA:  Shortness of breath, left-sided edema. EXAM: PORTABLE CHEST 1 VIEW COMPARISON:  04/03/2023 and CT chest 10/02/2022. FINDINGS: Trachea is midline. Heart is enlarged, stable. Mild interstitial prominence and indistinctness, basilar predominant. No definite pleural fluid. Lateral right clavicle resection. IMPRESSION: Mild pulmonary edema. Electronically Signed   By: Newell Eke M.D.   On: 11/29/2023 13:05     .Critical Care  Performed by: Emil Share, DO Authorized by: Emil Share, DO   Critical care provider statement:    Critical care time (minutes):  35   Critical care time was exclusive of:  Separately billable procedures and treating other patients   Critical care was time spent personally by me on the following activities:  Development of treatment plan with patient or surrogate, discussions with consultants, evaluation of patient's response to treatment, examination of patient, ordering and review of laboratory studies, ordering and review of radiographic studies, ordering and performing treatments and interventions, pulse oximetry, re-evaluation of patient's condition and review of old charts   Care discussed with: admitting provider      Medications Ordered in the ED - No data to display                                  Medical Decision Making Amount and/or Complexity of Data Reviewed Labs: ordered. Radiology: ordered.   68 yo F here with a chief complaints of swelling.  Difficult to assess on the patient as she is around 140 kg.  She does appear to have some edema to her left breast.  I do not appreciate any obvious erythema warmth or fluctuance.  Will obtain a laboratory evaluation.  Chest x-ray.  Hypercarbia.  Some increased  sleepiness.  Will start on BiPAP.  Awaiting blood work.  Admit post.   Signed out to Dr. Dreama, please see their note for further details.   The patients results and plan were reviewed and discussed.   Any x-rays performed were independently reviewed by myself.   Differential diagnosis were considered with the presenting HPI.  Medications -  No data to display  Vitals:   11/29/23 1158 11/29/23 1205 11/29/23 1208 11/29/23 1231  BP: (!) 166/83     Pulse: 92     Resp: 16     Temp: 97.7 F (36.5 C)     SpO2:  92%  99%  Weight:   121.1 kg   Height:   5' 2.5 (1.588 m)     Final diagnoses:  Acute on chronic systolic congestive heart failure (HCC)           Final diagnoses:  Acute on chronic systolic congestive heart failure [facility])    ED Discharge Orders     None          Emil Share, DO 11/29/23 1518

## 2023-11-29 NOTE — ED Triage Notes (Signed)
 Pt BIB EMS from home. Bed bound and uses 4 LO2 at baseline.  c/o edema, on left side. Hx of CHF, DM2, and kidney failure  EMS vitals  BP 102 palpatated systolic HR 94 SPO2 92 on 4L CBG 164

## 2023-11-29 NOTE — ED Provider Notes (Signed)
 Past Medical History:  Diagnosis Date   Diabetes mellitus without complication (HCC)    Hypertension    Knee pain, chronic    Neuropathy in diabetes (HCC)     Physical Exam  BP (!) 166/83 (BP Location: Right Arm) Comment (BP Location): taken below [facility] because of signifigant swelling of upper arm  Pulse 92   Temp 97.7 F (36.5 C)   Resp 16   Ht 5' 2.5 (1.588 m)   Wt 121.1 kg   SpO2 99%   BMI 48.05 kg/m   Physical Exam  Procedures  Procedures  ED Course / MDM    Medical Decision Making Amount and/or Complexity of Data Reviewed Labs: ordered. Radiology: ordered.  Risk Prescription drug management. Decision regarding hospitalization.    68yo female with history of DM, htn, CHF presents with shorntess of breath, left sided swelling.  Sleepy, fatigue--on10L HFNC, now being placed on BiPAP.   On my evaluation, she is doing well on BiPAP.  Labs returned and were evaluated by me show elevated BNP, mildly elevated troponin, consistent with clinical picture of CHF exacerbation.  No clinically significant anemia or electrolyte abnormalities.  Chest x-ray does show edema.  Ordered 60mg  IV lasix . Will admit for continued care of CHF exacerbation, acute hypoxic respiratory failure   Dreama Longs, MD 11/30/23 1045

## 2023-11-29 NOTE — ED Notes (Signed)
 Labs delayed because of failed IV attempts. IV team consult ordered

## 2023-11-29 NOTE — ED Notes (Signed)
Respiratory called for bipap order.

## 2023-11-29 NOTE — Progress Notes (Addendum)
 Placed patient on 6 LPM SALTER . SPO2 currently 99%. Patient wears 4 lpm at home.

## 2023-11-30 ENCOUNTER — Inpatient Hospital Stay (HOSPITAL_COMMUNITY)

## 2023-11-30 DIAGNOSIS — E662 Morbid (severe) obesity with alveolar hypoventilation: Secondary | ICD-10-CM | POA: Diagnosis not present

## 2023-11-30 DIAGNOSIS — E1169 Type 2 diabetes mellitus with other specified complication: Secondary | ICD-10-CM

## 2023-11-30 DIAGNOSIS — N61 Mastitis without abscess: Secondary | ICD-10-CM | POA: Insufficient documentation

## 2023-11-30 DIAGNOSIS — I50813 Acute on chronic right heart failure: Secondary | ICD-10-CM | POA: Diagnosis not present

## 2023-11-30 DIAGNOSIS — G4733 Obstructive sleep apnea (adult) (pediatric): Secondary | ICD-10-CM | POA: Diagnosis not present

## 2023-11-30 DIAGNOSIS — J9601 Acute respiratory failure with hypoxia: Secondary | ICD-10-CM | POA: Diagnosis not present

## 2023-11-30 DIAGNOSIS — F419 Anxiety disorder, unspecified: Secondary | ICD-10-CM

## 2023-11-30 DIAGNOSIS — E119 Type 2 diabetes mellitus without complications: Secondary | ICD-10-CM

## 2023-11-30 DIAGNOSIS — E1165 Type 2 diabetes mellitus with hyperglycemia: Secondary | ICD-10-CM

## 2023-11-30 DIAGNOSIS — E785 Hyperlipidemia, unspecified: Secondary | ICD-10-CM

## 2023-11-30 DIAGNOSIS — F32A Depression, unspecified: Secondary | ICD-10-CM

## 2023-11-30 DIAGNOSIS — Z794 Long term (current) use of insulin: Secondary | ICD-10-CM

## 2023-11-30 DIAGNOSIS — I5033 Acute on chronic diastolic (congestive) heart failure: Secondary | ICD-10-CM

## 2023-11-30 DIAGNOSIS — N1832 Chronic kidney disease, stage 3b: Secondary | ICD-10-CM

## 2023-11-30 DIAGNOSIS — I5031 Acute diastolic (congestive) heart failure: Secondary | ICD-10-CM

## 2023-11-30 LAB — ECHOCARDIOGRAM COMPLETE
Height: 62 in
S' Lateral: 2.9 cm
Weight: 4271.63 [oz_av]

## 2023-11-30 LAB — CBC
HCT: 48.8 % — ABNORMAL HIGH (ref 36.0–46.0)
Hemoglobin: 12.4 g/dL (ref 12.0–15.0)
MCH: 20.7 pg — ABNORMAL LOW (ref 26.0–34.0)
MCHC: 25.4 g/dL — ABNORMAL LOW (ref 30.0–36.0)
MCV: 81.5 fL (ref 80.0–100.0)
Platelets: 376 K/uL (ref 150–400)
RBC: 5.99 MIL/uL — ABNORMAL HIGH (ref 3.87–5.11)
RDW: 22.1 % — ABNORMAL HIGH (ref 11.5–15.5)
WBC: 5.8 K/uL (ref 4.0–10.5)
nRBC: 0 % (ref 0.0–0.2)

## 2023-11-30 LAB — BASIC METABOLIC PANEL WITH GFR
Anion gap: 10 (ref 5–15)
BUN: 29 mg/dL — ABNORMAL HIGH (ref 8–23)
CO2: 31 mmol/L (ref 22–32)
Calcium: 9.8 mg/dL (ref 8.9–10.3)
Chloride: 104 mmol/L (ref 98–111)
Creatinine, Ser: 1.98 mg/dL — ABNORMAL HIGH (ref 0.44–1.00)
GFR, Estimated: 27 mL/min — ABNORMAL LOW (ref >60)
Glucose, Bld: 76 mg/dL (ref 70–99)
Potassium: 4.5 mmol/L (ref 3.5–5.1)
Sodium: 145 mmol/L (ref 135–145)

## 2023-11-30 LAB — GLUCOSE, CAPILLARY
Glucose-Capillary: 96 mg/dL (ref 70–99)
Glucose-Capillary: 159 mg/dL — ABNORMAL HIGH (ref 70–99)
Glucose-Capillary: 123 mg/dL — ABNORMAL HIGH (ref 70–99)
Glucose-Capillary: 142 mg/dL — ABNORMAL HIGH (ref 70–99)
Glucose-Capillary: 143 mg/dL — ABNORMAL HIGH (ref 70–99)

## 2023-11-30 LAB — PRO BRAIN NATRIURETIC PEPTIDE: Pro Brain Natriuretic Peptide: 1624 pg/mL — ABNORMAL HIGH (ref <300.0)

## 2023-11-30 MED ORDER — FUROSEMIDE 10 MG/ML IJ SOLN
40.0000 mg | Freq: Two times a day (BID) | INTRAMUSCULAR | Status: DC
  Administered 2023-11-30 – 2023-12-02 (×5): 40 mg via INTRAVENOUS
  Filled 2023-11-30 (×5): qty 4

## 2023-11-30 MED ORDER — CHLORHEXIDINE GLUCONATE CLOTH 2 % EX PADS
6.0000 | MEDICATED_PAD | Freq: Every day | CUTANEOUS | Status: AC
  Administered 2023-11-30 – 2023-12-03 (×4): 6 via TOPICAL

## 2023-11-30 MED ORDER — FLUTICASONE PROPIONATE 50 MCG/ACT NA SUSP: 1.0000 | Freq: Two times a day (BID) | NASAL | Status: DC | PRN

## 2023-11-30 MED ORDER — ALLOPURINOL 100 MG PO TABS
100.0000 mg | ORAL_TABLET | Freq: Every day | ORAL | Status: DC
  Administered 2023-11-30 – 2023-12-27 (×26): 100 mg via ORAL
  Filled 2023-11-30 (×27): qty 1

## 2023-11-30 MED ORDER — NYSTATIN 100000 UNIT/GM EX CREA
1.0000 | TOPICAL_CREAM | Freq: Two times a day (BID) | CUTANEOUS | Status: DC
  Administered 2023-11-30 – 2023-12-28 (×54): 1 via TOPICAL
  Filled 2023-11-30 (×5): qty 30

## 2023-11-30 MED ORDER — TRIAMCINOLONE ACETONIDE 0.025 % EX CREA
1.0000 | TOPICAL_CREAM | Freq: Every day | CUTANEOUS | Status: DC
Start: 2023-12-01 — End: 2023-12-28
  Administered 2023-12-01 – 2023-12-26 (×24): 1 via TOPICAL
  Filled 2023-11-30 (×5): qty 15

## 2023-11-30 MED ORDER — PREGABALIN 75 MG PO CAPS
75.0000 mg | ORAL_CAPSULE | Freq: Two times a day (BID) | ORAL | Status: DC
  Administered 2023-11-30 – 2023-12-03 (×6): 75 mg via ORAL
  Filled 2023-11-30 (×6): qty 1

## 2023-11-30 MED ORDER — NYSTATIN 100000 UNIT/GM EX POWD: 1.0000 | Freq: Three times a day (TID) | CUTANEOUS | Status: DC | PRN

## 2023-11-30 MED ORDER — CALCITRIOL 0.25 MCG PO CAPS
0.2500 ug | ORAL_CAPSULE | ORAL | Status: DC
  Administered 2023-12-01 – 2023-12-20 (×9): 0.25 ug via ORAL
  Filled 2023-11-30 (×10): qty 1

## 2023-11-30 MED ORDER — METHOCARBAMOL 500 MG PO TABS
500.0000 mg | ORAL_TABLET | Freq: Every evening | ORAL | Status: DC | PRN
  Administered 2023-12-02 – 2023-12-21 (×6): 500 mg via ORAL
  Filled 2023-11-30 (×6): qty 1

## 2023-11-30 MED ORDER — MUPIROCIN 2 % EX OINT
1.0000 | TOPICAL_OINTMENT | Freq: Two times a day (BID) | CUTANEOUS | Status: AC
  Administered 2023-11-30 – 2023-12-04 (×10): 1 via NASAL
  Filled 2023-11-30 (×3): qty 22

## 2023-11-30 MED ORDER — OXYCODONE HCL 5 MG PO TABS
10.0000 mg | ORAL_TABLET | Freq: Two times a day (BID) | ORAL | Status: DC
  Administered 2023-11-30 – 2023-12-17 (×32): 10 mg via ORAL
  Filled 2023-11-30 (×34): qty 2

## 2023-11-30 MED ORDER — CEFAZOLIN SODIUM-DEXTROSE 2-4 GM/100ML-% IV SOLN
2.0000 g | Freq: Three times a day (TID) | INTRAVENOUS | Status: DC
  Administered 2023-11-30 – 2023-12-05 (×16): 2 g via INTRAVENOUS
  Filled 2023-11-30 (×16): qty 100

## 2023-11-30 MED ORDER — SERTRALINE HCL 50 MG PO TABS
50.0000 mg | ORAL_TABLET | Freq: Every morning | ORAL | Status: DC
  Administered 2023-12-01 – 2023-12-28 (×27): 50 mg via ORAL
  Filled 2023-11-30 (×28): qty 1

## 2023-11-30 MED ORDER — PANTOPRAZOLE SODIUM 40 MG PO TBEC
40.0000 mg | DELAYED_RELEASE_TABLET | Freq: Every day | ORAL | Status: DC
  Administered 2023-12-01 – 2023-12-21 (×21): 40 mg via ORAL
  Filled 2023-11-30 (×22): qty 1

## 2023-11-30 MED ORDER — HYDROXYZINE HCL 25 MG PO TABS
25.0000 mg | ORAL_TABLET | Freq: Four times a day (QID) | ORAL | Status: DC | PRN
  Administered 2023-12-01 – 2023-12-02 (×8): 25 mg via ORAL
  Filled 2023-11-30 (×9): qty 1

## 2023-11-30 MED ORDER — LORATADINE 10 MG PO TABS
10.0000 mg | ORAL_TABLET | Freq: Every day | ORAL | Status: DC
  Administered 2023-11-30 – 2023-12-28 (×28): 10 mg via ORAL
  Filled 2023-11-30 (×29): qty 1

## 2023-11-30 NOTE — Progress Notes (Signed)
 PT Cancellation Note  Patient Details Name: [name] [name] MRN: [id number] DOB: 1955/11/14   Cancelled Treatment:    Reason Eval/Treat Not Completed: PT screened, no needs identified, will sign off. Patient  requires total care, uses hoyer lift for OOB by family. No acute needs. Darice Potters PT Acute Rehabilitation Services Office [phone]    Potters Darice Norris 11/30/2023, 11:48 AM

## 2023-11-30 NOTE — Plan of Care (Signed)
  Problem: Coping: Goal: Ability to adjust to condition or change in health will improve Outcome: Progressing   Problem: Education: Goal: Knowledge of General Education information will improve Description: Including pain rating scale, medication(s)/side effects and non-pharmacologic comfort measures Outcome: Progressing   Problem: Clinical Measurements: Goal: Ability to maintain clinical measurements within normal limits will improve Outcome: Progressing   Problem: Safety: Goal: Ability to remain free from injury will improve Outcome: Progressing

## 2023-11-30 NOTE — Progress Notes (Signed)
 OT Cancellation Note  Patient Details Name: [name] [name] MRN: [id number] DOB: 04-Oct-1955   Cancelled Treatment:    Reason Eval/Treat Not Completed: OT screened, no needs identified, will sign off. Orders received. Pt is bed bound, total care at home with use of Hoyer lift for OOB. No acute OT needs, OT will complete orders.  Kesean Serviss L. Torrence Branagan, OTR/L  11/30/23, 1:10 PM

## 2023-11-30 NOTE — Plan of Care (Signed)
  Problem: Coping: Goal: Ability to adjust to condition or change in health will improve Outcome: Progressing   Problem: Fluid Volume: Goal: Ability to maintain a balanced intake and output will improve Outcome: Progressing   Problem: Health Behavior/Discharge Planning: Goal: Ability to manage health-related needs will improve Outcome: Progressing   Problem: Education: Goal: Knowledge of General Education information will improve Description: Including pain rating scale, medication(s)/side effects and non-pharmacologic comfort measures Outcome: Progressing   Problem: Health Behavior/Discharge Planning: Goal: Ability to manage health-related needs will improve Outcome: Progressing

## 2023-11-30 NOTE — Progress Notes (Addendum)
 PROGRESS NOTE    [name] [name]  FMW:[id number] DOB: 07-Apr-1955 DOA: 11/29/2023 PCP: [facility]    Chief Complaint  Patient presents with   generalied edema     Brief Narrative:  [name] [name] is a 68 y.o. female with medical history significant for insulin -dependent type 2 diabetes, chronic heart failure with preserved EF, morbid obesity, bedbound, hypertension being admitted to the hospital with weight gain and peripheral edema due to suspected acute exacerbation of chronic heart failure with preserved EF.    Assessment & Plan:   Principal Problem:   Acute respiratory failure with hypoxia (HCC) Active Problems:   Acute on chronic heart failure with preserved ejection fraction (HFpEF) (HCC)   Insulin  dependent type 2 diabetes mellitus (HCC)   Hyperlipidemia associated with type 2 diabetes mellitus (HCC)   Anxiety and depression   Morbid obesity (HCC)   OSA (obstructive sleep apnea)   Hyperglycemia due to type 2 diabetes mellitus (HCC)   Chronic kidney disease, stage 3b (HCC)   Obesity hypoventilation syndrome (HCC)   Cellulitis of left breast  #1 acute on chronic respiratory failure with hypoxia secondary to acute on chronic heart failure with HFpEF - Patient presented with a 2-week history of weight gain, worsening lower extremity edema, shortness of breath. - Patient noted to chronically be on 4 L nasal cannula. - Patient noted to have required BiPAP in the ER for comfort with improvement and currently on 6 L high flow nasal cannula. - Patient endorses compliance with diet and medications. - EKG with normal sinus rhythm, low voltage, no ischemic changes noted. - 2D echo from 03/2023 with a EF of 70 to 75%,NWMA, grade 1 DD, right ventricular systolic function severely reduced.  Severely enlarged right ventricular size. - Repeat 2D echo pending. - Patient noted to have received Lasix  60 mg IV x 1 in the ED and placed on Lasix  40 mg daily with a urine  output of 1.250 L over the past 24 hours. - Weight on admission noted 266.98 pounds. - Increase Lasix  to 40 mg IV every 12 hours. - Continue Lipitor. - Cardiology consulted and following.  2.  OSA with right-sided heart failure in the setting of suspected pulmonary hypertension/obesity hypoventilation syndrome - BiPAP nightly.  3.  CKD stage IIIb -Patient noted during last hospitalization to have creatinine peaked to 6.5 and required pressors as well as midodrine  and gentle IV fluid under supervision of nephrology during her February 2025 hospitalization. - Renal function appears close to baseline and fluctuates between 1.2-2.0. -Patient states no longer on midodrine . - Monitor renal function with diuresis.  4.  Insulin -dependent type 2 diabetes -Hemoglobin A1c 8.8. - CBG 96 this morning. - Continue current dose of Lantus  50 units daily, SSI.  5.  Left breast cellulitis -Patient with concerns over left breast cellulitis with warmth, erythema, tenderness to palpation. - No significant discharge noted. - Check a ultrasound of the left breast to rule out abscess formation. - Place on IV Ancef . - Supportive care.  6.  History of chronic hypotension -Patient states no longer takes midodrine , blood pressure currently stable will need to monitor with diuresis. - If blood pressure drops and patient still requires diuresis may consider resumption of midodrine .  7.  Hyperlipidemia -Continue Lipitor.  8.  Morbid obesity -BMI 48.05. - Patient noted to be bedbound due to her prior history of lower extremity fracture. - Lifestyle modification. - Follow-up with PCP.  9.  Anxiety/depression -Resume home regimen Zoloft.  DVT prophylaxis: Heparin  Code Status: Full Family Communication: Updated patient and caregiver/friend at bedside. Disposition: Likely home once clinically improved, cleared by cardiology.  Status is: Inpatient Remains inpatient appropriate because: Severity of  illness   Consultants:  Cardiology: Dr. Francyne 11/30/2023  Procedures:  2D echo pending 11/30/2023 Chest x-ray 11/29/2023   Antimicrobials:  Anti-infectives (From admission, onward)    Start     Dose/Rate Route Frequency Ordered Stop   11/30/23 1400  ceFAZolin  (ANCEF ) IVPB 2g/100 mL premix        2 g 200 mL/hr over 30 Minutes Intravenous Every 8 hours 11/30/23 1049 12/07/23 1359         Subjective: Patient lying in bed.  Still with shortness of breath is slightly better than on admission.  Denies any chest pain.  Complains of left breast pain ongoing x 2 weeks.  Denies any chest pain.  No abdominal pain.  Caregiver at bedside.  Off BiPAP.  On 6 L high flow nasal cannula. RN at bedside.  Objective: Vitals:   11/30/23 0800 11/30/23 0820 11/30/23 0900 11/30/23 1000  BP: 135/81  135/63 114/77  Pulse: 88  84 86  Resp: 14  15 (!) 22  Temp:  97.9 F (36.6 C)    TempSrc:  Oral    SpO2: 93%  90% 93%  Weight:      Height:        Intake/Output Summary (Last 24 hours) at 11/30/2023 1052 Last data filed at 11/30/2023 0937 Gross per 24 hour  Intake 240 ml  Output 1650 ml  Net -1410 ml   Filed Weights   11/29/23 1208  Weight: 121.1 kg    Examination:  General exam: Appears calm and comfortable  Respiratory system: Diffuse scattered crackles.  Decreased breath sounds in the bases.  No significant wheezing.  Speaking in full sentences.  No use of accessory muscles of respiration.  Cardiovascular system: S1 & S2 heard, RRR. No murmurs, rubs, gallops or clicks.  2-3+ bilateral lower extremity edema.  No pedal edema.  Unable to assess JVD due to thick neck/body habitus. Gastrointestinal system: Abdomen is obese, nondistended, soft and nontender. No organomegaly or masses felt. Normal bowel sounds heard. Central nervous system: Alert and oriented. No focal neurological deficits. Extremities: Symmetric 5 x 5 power. Skin: Left breast cellulitis.  Psychiatry: Judgement and insight  appear normal. Mood & affect appropriate.     Data Reviewed: I have personally reviewed following labs and imaging studies  CBC: Recent Labs  Lab 11/29/23 1400 11/30/23 0300  WBC 5.8 5.8  NEUTROABS 3.2  --   HGB 12.9 12.4  HCT 48.5* 48.8*  MCV 79.8* 81.5  PLT 272 376    Basic Metabolic Panel: Recent Labs  Lab 11/29/23 1531 11/30/23 0300  NA 143 145  K 4.7 4.5  CL 102 104  CO2 31 31  GLUCOSE 97 76  BUN 29* 29*  CREATININE 1.94* 1.98*  CALCIUM  9.9 9.8    GFR: Estimated Creatinine Clearance: 33.1 mL/min (A) (by C-G formula based on SCr of 1.98 mg/dL (H)).  Liver Function Tests: Recent Labs  Lab 11/29/23 1531  AST 15  ALT 10  ALKPHOS 210*  BILITOT 0.6  PROT 7.4  ALBUMIN  3.9    CBG: Recent Labs  Lab 11/29/23 2203 11/30/23 0833  GLUCAP 110* 96     Recent Results (from the past 240 hours)  MRSA Next Gen by PCR, Nasal     Status: Abnormal   Collection Time: 11/29/23  7:06 PM   Specimen: Nasal Mucosa; Nasal Swab  Result Value Ref Range Status   MRSA by PCR Next Gen DETECTED (A) NOT DETECTED Final    Comment: (NOTE) The GeneXpert MRSA Assay (FDA approved for NASAL specimens only), is one component of a comprehensive MRSA colonization surveillance program. It is not intended to diagnose MRSA infection nor to guide or monitor treatment for MRSA infections. Test performance is not FDA approved in patients less than 65 years old. Performed at [facility], [address], [place], [place] [address]          Radiology Studies: [facility] Chest Port 1 View Result Date: 11/29/2023 CLINICAL DATA:  Shortness of breath, left-sided edema. EXAM: PORTABLE CHEST 1 VIEW COMPARISON:  04/03/2023 and CT chest 10/02/2022. FINDINGS: Trachea is midline. Heart is enlarged, stable. Mild interstitial prominence and indistinctness, basilar predominant. No definite pleural fluid. Lateral right clavicle resection. IMPRESSION: Mild pulmonary edema. Electronically  Signed   By: Newell Eke M.D.   On: 11/29/2023 13:05        Scheduled Meds:  atorvastatin   10 mg Oral QHS   Chlorhexidine  Gluconate Cloth  6 each Topical Daily   furosemide   40 mg Intravenous Q12H   heparin   5,000 Units Subcutaneous Q8H   insulin  aspart  0-20 Units Subcutaneous TID WC   insulin  aspart  0-5 Units Subcutaneous QHS   insulin  glargine  50 Units Subcutaneous Daily   mupirocin  ointment  1 Application Nasal BID   mouth rinse  15 mL Mouth Rinse 4 times per day   potassium chloride  SA  20 mEq Oral Daily   sertraline  50 mg Oral q AM   Continuous Infusions:   ceFAZolin  (ANCEF ) IV       LOS: 1 day    Time spent: 45 minutes    Toribio Hummer, MD [facility]   To contact the attending provider between 7A-7P or the covering provider during after hours 7P-7A, please log into the web site [email or web address] and access using universal [place] password for that web site. If you do not have the password, please call the hospital operator.  11/30/2023, 10:52 AM
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
## 2023-12-01 ENCOUNTER — Other Ambulatory Visit: Payer: Self-pay

## 2023-12-01 ENCOUNTER — Encounter (HOSPITAL_COMMUNITY): Payer: Self-pay | Admitting: Internal Medicine

## 2023-12-01 DIAGNOSIS — G4733 Obstructive sleep apnea (adult) (pediatric): Secondary | ICD-10-CM | POA: Diagnosis not present

## 2023-12-01 DIAGNOSIS — E1169 Type 2 diabetes mellitus with other specified complication: Secondary | ICD-10-CM | POA: Diagnosis not present

## 2023-12-01 DIAGNOSIS — N61 Mastitis without abscess: Secondary | ICD-10-CM | POA: Diagnosis not present

## 2023-12-01 DIAGNOSIS — J9601 Acute respiratory failure with hypoxia: Secondary | ICD-10-CM | POA: Diagnosis not present

## 2023-12-01 DIAGNOSIS — I5033 Acute on chronic diastolic (congestive) heart failure: Secondary | ICD-10-CM | POA: Diagnosis not present

## 2023-12-01 LAB — CBC WITH DIFFERENTIAL/PLATELET
Abs Immature Granulocytes: 0.02 K/uL (ref 0.00–0.07)
Basophils Absolute: 0 K/uL (ref 0.0–0.1)
Basophils Relative: 1 %
Eosinophils Absolute: 0.4 K/uL (ref 0.0–0.5)
Eosinophils Relative: 6 %
HCT: 48.1 % — ABNORMAL HIGH (ref 36.0–46.0)
Hemoglobin: 12.2 g/dL (ref 12.0–15.0)
Immature Granulocytes: 0 %
Lymphocytes Relative: 27 %
Lymphs Abs: 1.6 K/uL (ref 0.7–4.0)
MCH: 20.7 pg — ABNORMAL LOW (ref 26.0–34.0)
MCHC: 25.4 g/dL — ABNORMAL LOW (ref 30.0–36.0)
MCV: 81.5 fL (ref 80.0–100.0)
Monocytes Absolute: 0.7 K/uL (ref 0.1–1.0)
Monocytes Relative: 11 %
Neutro Abs: 3.4 K/uL (ref 1.7–7.7)
Neutrophils Relative %: 55 %
Platelets: 376 K/uL (ref 150–400)
RBC: 5.9 MIL/uL — ABNORMAL HIGH (ref 3.87–5.11)
RDW: 22.1 % — ABNORMAL HIGH (ref 11.5–15.5)
Smear Review: NORMAL
WBC: 6.1 K/uL (ref 4.0–10.5)
nRBC: 0 % (ref 0.0–0.2)

## 2023-12-01 LAB — RENAL FUNCTION PANEL
Albumin: 3.6 g/dL (ref 3.5–5.0)
Anion gap: 10 (ref 5–15)
BUN: 27 mg/dL — ABNORMAL HIGH (ref 8–23)
CO2: 31 mmol/L (ref 22–32)
Calcium: 9.2 mg/dL (ref 8.9–10.3)
Chloride: 103 mmol/L (ref 98–111)
Creatinine, Ser: 2.13 mg/dL — ABNORMAL HIGH (ref 0.44–1.00)
GFR, Estimated: 24 mL/min — ABNORMAL LOW (ref >60)
Glucose, Bld: 104 mg/dL — ABNORMAL HIGH (ref 70–99)
Phosphorus: 4.7 mg/dL — ABNORMAL HIGH (ref 2.5–4.6)
Potassium: 4.2 mmol/L (ref 3.5–5.1)
Sodium: 144 mmol/L (ref 135–145)

## 2023-12-01 LAB — GLUCOSE, CAPILLARY
Glucose-Capillary: 115 mg/dL — ABNORMAL HIGH (ref 70–99)
Glucose-Capillary: 154 mg/dL — ABNORMAL HIGH (ref 70–99)
Glucose-Capillary: 138 mg/dL — ABNORMAL HIGH (ref 70–99)
Glucose-Capillary: 151 mg/dL — ABNORMAL HIGH (ref 70–99)

## 2023-12-01 LAB — HIV ANTIBODY (ROUTINE TESTING W REFLEX): HIV Screen 4th Generation wRfx: NONREACTIVE

## 2023-12-01 LAB — MAGNESIUM: Magnesium: 1.8 mg/dL (ref 1.7–2.4)

## 2023-12-01 MED ORDER — SENNOSIDES-DOCUSATE SODIUM 8.6-50 MG PO TABS
1.0000 | ORAL_TABLET | Freq: Two times a day (BID) | ORAL | Status: DC
  Administered 2023-12-01 – 2023-12-07 (×7): 1 via ORAL
  Filled 2023-12-01 (×7): qty 1

## 2023-12-01 MED ORDER — POLYETHYLENE GLYCOL 3350 17 G PO PACK
17.0000 g | PACK | Freq: Every day | ORAL | Status: DC
  Administered 2023-12-03 – 2023-12-16 (×4): 17 g via ORAL
  Filled 2023-12-01 (×10): qty 1

## 2023-12-01 MED ORDER — ORAL CARE MOUTH RINSE: 15.0000 mL | OROMUCOSAL | Status: DC | PRN

## 2023-12-01 NOTE — Plan of Care (Incomplete)
  Problem: Education: Goal: Ability to describe self-care measures that may prevent or decrease complications (Diabetes Survival Skills Education) will improve Outcome: Progressing Goal: Individualized Educational Video(s) Outcome: Progressing   Problem: Coping: Goal: Ability to adjust to condition or change in health will improve Outcome: Progressing   Problem: Fluid Volume: Goal: Ability to maintain a balanced intake and output will improve Outcome: Progressing   Problem: Health Behavior/Discharge Planning: Goal: Ability to identify and utilize available resources and services will improve Outcome: Progressing Goal: Ability to manage health-related needs will improve Outcome: Progressing   Problem: Metabolic: Goal: Ability to maintain appropriate glucose levels will improve Outcome: Progressing   Problem: Nutritional: Goal: Maintenance of adequate nutrition will improve Outcome: Progressing Goal: Progress toward achieving an optimal weight will improve Outcome: Progressing   Problem: Skin Integrity: Goal: Risk for impaired skin integrity will decrease Outcome: Progressing   Problem: Tissue Perfusion: Goal: Adequacy of tissue perfusion will improve Outcome: Progressing   Problem: Education: Goal: Knowledge of General Education information will improve Description: Including pain rating scale, medication(s)/side effects and non-pharmacologic comfort measures Outcome: Progressing   Problem: Health Behavior/Discharge Planning: Goal: Ability to manage health-related needs will improve Outcome: Progressing   Problem: Clinical Measurements: Goal: Ability to maintain clinical measurements within normal limits will improve Outcome: Progressing Goal: Will remain free from infection Outcome: Progressing Goal: Diagnostic test results will improve Outcome: Progressing Goal: Respiratory complications will improve Outcome: Progressing Goal: Cardiovascular complication will  be avoided Outcome: Progressing   Problem: Nutrition: Goal: Adequate nutrition will be maintained Outcome: Progressing   Problem: Coping: Goal: Level of anxiety will decrease Outcome: Progressing   Problem: Elimination: Goal: Will not experience complications related to bowel motility Outcome: Progressing Goal: Will not experience complications related to urinary retention Outcome: Progressing   Problem: Pain Managment: Goal: General experience of comfort will improve and/or be controlled Outcome: Progressing   Problem: Safety: Goal: Ability to remain free from injury will improve Outcome: Progressing   Problem: Skin Integrity: Goal: Risk for impaired skin integrity will decrease Outcome: Progressing   Problem: Clinical Measurements: Goal: Ability to avoid or minimize complications of infection will improve Outcome: Progressing   Problem: Skin Integrity: Goal: Skin integrity will improve Outcome: Progressing

## 2023-12-01 NOTE — Plan of Care (Signed)
  Problem: Coping: Goal: Ability to adjust to condition or change in health will improve Outcome: Progressing   Problem: Health Behavior/Discharge Planning: Goal: Ability to manage health-related needs will improve Outcome: Progressing   Problem: Skin Integrity: Goal: Risk for impaired skin integrity will decrease Outcome: Progressing   Problem: Education: Goal: Knowledge of General Education information will improve Description: Including pain rating scale, medication(s)/side effects and non-pharmacologic comfort measures Outcome: Progressing

## 2023-12-01 NOTE — Progress Notes (Signed)
   12/01/23 2252  BiPAP/CPAP/SIPAP  $ Non-Invasive Home Ventilator  Initial  $ Face Mask Medium Yes  BiPAP/CPAP/SIPAP Pt Type Adult  BiPAP/CPAP/SIPAP Resmed  Mask Type Full face mask  Dentures removed? Not applicable  Mask Size Medium  IPAP 18 cmH20  EPAP 10 cmH2O  Flow Rate 7 lpm  Leak  (good status)  Patient Home Machine No  Patient Home Mask No  Patient Home Tubing No  Auto Titrate No  Nasal massage performed Yes  CPAP/SIPAP surface wiped down Yes  Device Plugged into RED Power Outlet Yes  BiPAP/CPAP /SiPAP Vitals  Pulse Rate 89  Resp (!) 21  SpO2 95 %  MEWS Score/Color  MEWS Score 1  MEWS Score Color Green   Bipap for at bedtime use, RT bleed in 7L with machine

## 2023-12-01 NOTE — Progress Notes (Signed)
 PROGRESS NOTE    [name] [name]  FMW:[id number] DOB: [birth year] DOA: 11/29/2023 PCP: [facility]    Chief Complaint  Patient presents with   generalied edema     Brief Narrative:  [name] [name] is a 68 y.o. female with medical history significant for insulin -dependent type 2 diabetes, chronic heart failure with preserved EF, morbid obesity, bedbound, hypertension being admitted to the hospital with weight gain and peripheral edema due to suspected acute exacerbation of chronic heart failure with preserved EF.    Assessment & Plan:   Principal Problem:   Acute respiratory failure with hypoxia (HCC) Active Problems:   Acute on chronic heart failure with preserved ejection fraction (HFpEF) (HCC)   Insulin  dependent type 2 diabetes mellitus (HCC)   Hyperlipidemia associated with type 2 diabetes mellitus (HCC)   Anxiety and depression   Morbid obesity (HCC)   OSA (obstructive sleep apnea)   Hyperglycemia due to type 2 diabetes mellitus (HCC)   Chronic kidney disease, stage 3b (HCC)   Obesity hypoventilation syndrome (HCC)   Cellulitis of left breast  #1 acute on chronic respiratory failure with hypoxia secondary to acute on chronic heart failure with HFpEF - Patient presented with a 2-week history of weight gain, worsening lower extremity edema, shortness of breath. - Patient noted to chronically be on 4 L nasal cannula. - Patient noted to have required BiPAP in the ER for comfort with improvement and currently on 6 L high flow nasal cannula. - Patient endorses compliance with diet and medications. - EKG with normal sinus rhythm, low voltage, no ischemic changes noted. - 2D echo from 03/2023 with a EF of 70 to 75%,NWMA, grade 1 DD, right ventricular systolic function severely reduced.  Severely enlarged right ventricular size. - Repeat 2D echo with EF of 60 to 65%,NWMA, concentric LVH.  Right ventricular systolic function mildly reduced.  Right ventricular size  mildly enlarged.  Very limited echo due to poor sound wave transmission.  Tech unable to obtain apical images due to patient discomfort.. - Patient noted to have received Lasix  60 mg IV x 1 in the ED and placed on Lasix  40 mg daily with a urine output of 1.4 L over the past 24 hours. - Current weight 288.36 pounds from 297.4 pounds from 266.98 pounds on admission. - Continue Lasix  to 40 mg IV every 12 hours. - Continue Lipitor. - Patient seen in consultation by cardiology, feel patient is not a candidate for SGLT2 with obesity, bedbound status.   - Cardiology feels patient is RV failure due to sleep apnea, hypoxemia, obesity hypoventilation syndrome and noted to have been on torsemide  20 mg daily at home prior to admission.   -Per cardiology may consider RHC if patient continues to have RV failure. - Cardiology following and appreciate their input and recommendations.   2.  OSA with right-sided heart failure in the setting of suspected pulmonary hypertension/obesity hypoventilation syndrome - BiPAP nightly.  3.  CKD stage IIIb -Patient noted during last hospitalization to have creatinine peaked to 6.5 and required pressors as well as midodrine  and gentle IV fluid under supervision of nephrology during her February 2025 hospitalization. - Renal function appears close to baseline and fluctuates between 1.2-2.0. -Patient states no longer on midodrine . - Monitor renal function with diuresis.  4.  Insulin -dependent type 2 diabetes -Hemoglobin A1c 8.8. - CBG 115 this morning. - Continue current dose of Lantus  50 units daily, SSI.  5.  Left breast cellulitis -Patient with concerns over left  breast cellulitis with warmth, erythema, tenderness to palpation. - No significant discharge noted. - Ultrasound of left breast done with results pending.  - Continue IV Ancef . - Supportive care.  6.  History of chronic hypotension -Patient states no longer takes midodrine , blood pressure currently stable  will need to monitor with diuresis. - If blood pressure drops and patient still requires diuresis may consider resumption of midodrine .  7.  Hyperlipidemia - Lipitor.  8.  Morbid obesity -BMI 48.05. - Patient noted to be bedbound due to her prior history of lower extremity fracture. - Lifestyle modification. - Follow-up with PCP.  9.  Anxiety/depression - Continue home regimen Zoloft.   DVT prophylaxis: Heparin  Code Status: Full Family Communication: Updated patient and caregiver/friend at bedside. Disposition: Likely home once clinically improved, cleared by cardiology.  Status is: Inpatient Remains inpatient appropriate because: Severity of illness   Consultants:  Cardiology: Dr. Francyne 11/30/2023  Procedures:  2D echo 11/30/2023 Chest x-ray 11/29/2023 Ultrasound left breast pending 11/30/2023  Antimicrobials:  Anti-infectives (From admission, onward)    Start     Dose/Rate Route Frequency Ordered Stop   11/30/23 1200  ceFAZolin  (ANCEF ) IVPB 2g/100 mL premix        2 g 200 mL/hr over 30 Minutes Intravenous Every 8 hours 11/30/23 1049 12/07/23 1359         Subjective: Patient laying in bed.  States maybe some slight improvement with shortness of breath however still feels she needs excess fluid to be pulled off.  Patient with complaints of left breast pain.  Patient also with complaints of discomfort in abdominal folds.  Patient denies any chest pain.  Caregiver at bedside.  Patient on 6 L high flow nasal cannula with sats of 96%.  RN at bedside.  Objective: Vitals:   12/01/23 0821 12/01/23 0904 12/01/23 1000 12/01/23 1100  BP:      Pulse: 94  98 97  Resp: 20  (!) 24 18  Temp:  97.7 F (36.5 C)    TempSrc:  Oral    SpO2: 96%  (!) 89% (!) 89%  Weight:      Height:        Intake/Output Summary (Last 24 hours) at 12/01/2023 1101 Last data filed at 12/01/2023 0830 Gross per 24 hour  Intake 1398.18 ml  Output 1150 ml  Net 248.18 ml   Filed Weights    11/29/23 1208 11/30/23 1800 12/01/23 0500  Weight: 121.1 kg 134.9 kg 130.8 kg    Examination:  General exam: NAD. Respiratory system: Diffuse scattered crackles.  Some decreased breath sounds in the bases.  No wheezing noted.  Speaking in full sentences.  No use of accessory muscles of respiration.  Cardiovascular system: RRR with no murmurs rubs or gallops.  Unable to assess JVD due to body habitus.  2+ bilateral lower extremity edema.  Gastrointestinal system: Abdomen is obese, nondistended, soft, nontender, positive bowel sounds.  No rebound.  No guarding.  Central nervous system: Alert and oriented. No focal neurological deficits. Extremities: Symmetric 5 x 5 power. Skin: Left breast cellulitis with slightly decreased erythema, still with significant tenderness to palpation, some induration.  Psychiatry: Judgement and insight appear normal. Mood & affect appropriate.     Data Reviewed: I have personally reviewed following labs and imaging studies  CBC: Recent Labs  Lab 11/29/23 1400 11/30/23 0300 12/01/23 0316  WBC 5.8 5.8 6.1  NEUTROABS 3.2  --  3.4  HGB 12.9 12.4 12.2  HCT 48.5* 48.8* 48.1*  MCV  79.8* 81.5 81.5  PLT 272 376 376    Basic Metabolic Panel: Recent Labs  Lab 11/29/23 1531 11/30/23 0300 12/01/23 0316  NA 143 145 144  K 4.7 4.5 4.2  CL 102 104 103  CO2 31 31 31   GLUCOSE 97 76 104*  BUN 29* 29* 27*  CREATININE 1.94* 1.98* 2.13*  CALCIUM  9.9 9.8 9.2  MG  --   --  1.8  PHOS  --   --  4.7*    GFR: Estimated Creatinine Clearance: 32.2 mL/min (A) (by C-G formula based on SCr of 2.13 mg/dL (H)).  Liver Function Tests: Recent Labs  Lab 11/29/23 1531 12/01/23 0316  AST 15  --   ALT 10  --   ALKPHOS 210*  --   BILITOT 0.6  --   PROT 7.4  --   ALBUMIN  3.9 3.6    CBG: Recent Labs  Lab 11/30/23 1147 11/30/23 1249 11/30/23 1615 11/30/23 2232 12/01/23 0758  GLUCAP 159* 123* 142* 143* 115*     Recent Results (from the past 240 hours)   MRSA Next Gen by PCR, Nasal     Status: Abnormal   Collection Time: 11/29/23  7:06 PM   Specimen: Nasal Mucosa; Nasal Swab  Result Value Ref Range Status   MRSA by PCR Next Gen DETECTED (A) NOT DETECTED Final    Comment: (NOTE) The GeneXpert MRSA Assay (FDA approved for NASAL specimens only), is one component of a comprehensive MRSA colonization surveillance program. It is not intended to diagnose MRSA infection nor to guide or monitor treatment for MRSA infections. Test performance is not FDA approved in patients less than 47 years old. Performed at [facility], [address], [place], [place] [address]          Radiology Studies: ECHOCARDIOGRAM COMPLETE Result Date: 11/30/2023    ECHOCARDIOGRAM REPORT   Patient Name:   [name] [name] Date of Exam: 11/30/2023 Medical Rec #:  [id number]          Height:       62.5 in Accession #:    [id number]         Weight:       267.0 lb Date of Birth:  09-13-1955          BSA:          2.174 m Patient Age:    68 years           BP:           135/81 mmHg Patient Gender: F                  HR:           87 bpm. Exam Location:  Inpatient Procedure: 2D Echo, Cardiac Doppler, Color Doppler and Intracardiac            Opacification Agent (Both Spectral and Color Flow Doppler were            utilized during procedure). Indications:    CHF-Acute Diastolic I50.31  History:        Patient has prior history of Echocardiogram examinations, most                 recent 03/28/2023. Risk Factors:Hypertension and Diabetes.  Sonographer:    Jayson Gaskins Referring Phys: [id number] MIR M [facility]  Sonographer Comments: Technically challenging study due to limited acoustic windows. No apicals due to pain. IMPRESSIONS  1. Left ventricular ejection fraction, by estimation, is  60 to 65%. The left ventricle has normal function. The left ventricle has no regional wall motion abnormalities. There is mild concentric left ventricular hypertrophy. Left ventricular  diastolic function could not be evaluated.  2. Right ventricular systolic function is mildly reduced. The right ventricular size is mildly enlarged.  3. The mitral valve is normal in structure. No evidence of mitral valve regurgitation. No evidence of mitral stenosis.  4. The aortic valve was not well visualized. Aortic valve regurgitation is not visualized. No aortic stenosis is present. Conclusion(s)/Recommendation(s): Very limited echo due to poor sound wave transmission. Also tech unable to obtain apical images due to patient discomfort. RV not seen well but on available images seems mildly dialted and mildly hypokinetic. Consider repeat echo when patient able to toelrate apical imaging. FINDINGS  Left Ventricle: Left ventricular ejection fraction, by estimation, is 60 to 65%. The left ventricle has normal function. The left ventricle has no regional wall motion abnormalities. The left ventricular internal cavity size was normal in size. There is  mild concentric left ventricular hypertrophy. Left ventricular diastolic function could not be evaluated. Right Ventricle: The right ventricular size is mildly enlarged. No increase in right ventricular wall thickness. Right ventricular systolic function is mildly reduced. Left Atrium: Left atrial size was normal in size. Right Atrium: Right atrial size was normal in size. Pericardium: There is no evidence of pericardial effusion. Mitral Valve: The mitral valve is normal in structure. No evidence of mitral valve regurgitation. No evidence of mitral valve stenosis. Tricuspid Valve: The tricuspid valve is not well visualized. Tricuspid valve regurgitation is trivial. No evidence of tricuspid stenosis. Aortic Valve: The aortic valve was not well visualized. Aortic valve regurgitation is not visualized. No aortic stenosis is present. Pulmonic Valve: The pulmonic valve was not well visualized. Pulmonic valve regurgitation is trivial. No evidence of pulmonic stenosis. Aorta:  The aortic root is normal in size and structure. Venous: The inferior vena cava was not well visualized. IAS/Shunts: No atrial level shunt detected by color flow Doppler.  LEFT VENTRICLE PLAX 2D LVIDd:         3.70 cm LVIDs:         2.90 cm LV PW:         1.30 cm LV IVS:        1.20 cm  Toribio Fuel MD Electronically signed by Toribio Fuel MD Signature Date/Time: 11/30/2023/12:50:42 PM    Final    DG Chest Port 1 View Result Date: 11/29/2023 CLINICAL DATA:  Shortness of breath, left-sided edema. EXAM: PORTABLE CHEST 1 VIEW COMPARISON:  04/03/2023 and CT chest 10/02/2022. FINDINGS: Trachea is midline. Heart is enlarged, stable. Mild interstitial prominence and indistinctness, basilar predominant. No definite pleural fluid. Lateral right clavicle resection. IMPRESSION: Mild pulmonary edema. Electronically Signed   By: Newell Eke M.D.   On: 11/29/2023 13:05        Scheduled Meds:  allopurinol   100 mg Oral QHS   atorvastatin   10 mg Oral QHS   calcitRIOL  0.25 mcg Oral Q M,W,F   Chlorhexidine  Gluconate Cloth  6 each Topical Daily   furosemide   40 mg Intravenous Q12H   heparin   5,000 Units Subcutaneous Q8H   insulin  aspart  0-20 Units Subcutaneous TID WC   insulin  aspart  0-5 Units Subcutaneous QHS   insulin  glargine  50 Units Subcutaneous Daily   loratadine   10 mg Oral Daily   mupirocin  ointment  1 Application Nasal BID   nystatin  cream  1 Application Topical  BID   oxyCODONE   10 mg Oral BID   pantoprazole   40 mg Oral QAC breakfast   polyethylene glycol  17 g Oral Daily   potassium chloride  SA  20 mEq Oral Daily   pregabalin   75 mg Oral BID   senna-docusate  1 tablet Oral BID   sertraline  50 mg Oral q AM   triamcinolone  1 Application Topical Daily   Continuous Infusions:   ceFAZolin  (ANCEF ) IV Stopped (12/01/23 0703)     LOS: 2 days    Time spent: 45 minutes    Toribio Hummer, MD [facility]   To contact the attending provider between 7A-7P or the  covering provider during after hours 7P-7A, please log into the web site [email or web address] and access using universal Moore password for that web site. If you do not have the password, please call the hospital operator.  12/01/2023, 11:01 AM

## 2023-12-01 NOTE — Progress Notes (Signed)
  Progress Note  Patient Name: [name] [name] Date of Encounter: 12/01/2023 [facility] Cardiologist: None   Interval Summary   Not a lot of progress based on UO, but reportedly weight is down 8 lb since yesterday. Creat minimally higher since yesterday, still close to her usual baseline.  Vital Signs Vitals:   12/01/23 0821 12/01/23 0904 12/01/23 1000 12/01/23 1100  BP:      Pulse: 94  98 97  Resp: 20  (!) 24 18  Temp:  97.7 F (36.5 C)    TempSrc:  Oral    SpO2: 96%  (!) 89% (!) 89%  Weight:      Height:        Intake/Output Summary (Last 24 hours) at 12/01/2023 1125 Last data filed at 12/01/2023 0830 Gross per 24 hour  Intake 1398.18 ml  Output 1150 ml  Net 248.18 ml      12/01/2023    5:00 AM 11/30/2023    6:00 PM 11/29/2023   12:08 PM  Last 3 Weights  Weight (lbs) 288 lb 5.8 oz 297 lb 6.4 oz 266 lb 15.6 oz  Weight (kg) 130.8 kg 134.9 kg 121.1 kg      Telemetry/ECG  NSR, rare PVCs - Personally Reviewed  Physical Exam  GEN: No acute distress.  Morbidly obese Neck: No JVD Cardiac: RRR, no murmurs, rubs, or gallops.  Respiratory: Clear to auscultation bilaterally. GI: Soft, nontender, non-distended  MS: Severe dependent edema edema  Assessment & Plan    68 yo bed-bound patient with morbid obesity, chronic respiratory failure on home O2,  presents with acute on chronic R heart failure due to cor pulmonale (obesity-hypoventilation sd and OSA), on background of CKD 3B and insulin  requiring DM2. Continue diuresis.  Patient estimates a 20 lb weight gain from baseline. Discharge weight in February 2025 was 121 kg (current weight 130.8 kg, 21.5 lb higher)  Seems to be diuresing well based on weight.  Continue IV diuretics and renal function monitoring.   For questions or updates, please contact [facility] Please consult [email or web address] for contact info under         Signed, Jerel Balding, MD

## 2023-12-01 NOTE — Consult Note (Signed)
 This remote consultation was conducted. Pertinent information was reviewed in order to determine recommendations.   Coordinated care with primary nurse via secure chat. Would like [employer] fabric for moisture associated skin breakdown, encourage nurse place images of skin breakdown in chart. Will remove patient from census task list.  Please reconsult if wound worsens in condition and notify provider.   Sherrilyn Hals MSN RN CWOCN WOC [facility]  [phone] (Available from 7-3 pm Mon-Friday)

## 2023-12-02 DIAGNOSIS — G4733 Obstructive sleep apnea (adult) (pediatric): Secondary | ICD-10-CM | POA: Diagnosis not present

## 2023-12-02 DIAGNOSIS — J9601 Acute respiratory failure with hypoxia: Secondary | ICD-10-CM | POA: Diagnosis not present

## 2023-12-02 DIAGNOSIS — E1169 Type 2 diabetes mellitus with other specified complication: Secondary | ICD-10-CM | POA: Diagnosis not present

## 2023-12-02 DIAGNOSIS — N61 Mastitis without abscess: Secondary | ICD-10-CM | POA: Diagnosis not present

## 2023-12-02 LAB — CBC
HCT: 50 % — ABNORMAL HIGH (ref 36.0–46.0)
Hemoglobin: 12.7 g/dL (ref 12.0–15.0)
MCH: 20.9 pg — ABNORMAL LOW (ref 26.0–34.0)
MCHC: 25.4 g/dL — ABNORMAL LOW (ref 30.0–36.0)
MCV: 82.1 fL (ref 80.0–100.0)
Platelets: 319 K/uL (ref 150–400)
RBC: 6.09 MIL/uL — ABNORMAL HIGH (ref 3.87–5.11)
RDW: 22.3 % — ABNORMAL HIGH (ref 11.5–15.5)
WBC: 6.6 K/uL (ref 4.0–10.5)
nRBC: 0 % (ref 0.0–0.2)

## 2023-12-02 LAB — GLUCOSE, CAPILLARY
Glucose-Capillary: 140 mg/dL — ABNORMAL HIGH (ref 70–99)
Glucose-Capillary: 137 mg/dL — ABNORMAL HIGH (ref 70–99)
Glucose-Capillary: 88 mg/dL (ref 70–99)
Glucose-Capillary: 87 mg/dL (ref 70–99)

## 2023-12-02 LAB — BASIC METABOLIC PANEL WITH GFR
Anion gap: 11 (ref 5–15)
BUN: 26 mg/dL — ABNORMAL HIGH (ref 8–23)
CO2: 30 mmol/L (ref 22–32)
Calcium: 9.5 mg/dL (ref 8.9–10.3)
Chloride: 100 mmol/L (ref 98–111)
Creatinine, Ser: 1.99 mg/dL — ABNORMAL HIGH (ref 0.44–1.00)
GFR, Estimated: 26 mL/min — ABNORMAL LOW (ref >60)
Glucose, Bld: 132 mg/dL — ABNORMAL HIGH (ref 70–99)
Potassium: 4.3 mmol/L (ref 3.5–5.1)
Sodium: 141 mmol/L (ref 135–145)

## 2023-12-02 LAB — MAGNESIUM: Magnesium: 1.8 mg/dL (ref 1.7–2.4)

## 2023-12-02 MED ORDER — ROPINIROLE HCL 0.25 MG PO TABS
0.2500 mg | ORAL_TABLET | Freq: Once | ORAL | Status: DC
  Filled 2023-12-02: qty 1

## 2023-12-02 MED ORDER — NAPHAZOLINE-PHENIRAMINE 0.025-0.3 % OP SOLN
1.0000 [drp] | Freq: Four times a day (QID) | OPHTHALMIC | Status: DC | PRN
  Administered 2023-12-03: 1 [drp] via OPHTHALMIC
  Filled 2023-12-02: qty 15

## 2023-12-02 MED ORDER — FUROSEMIDE 10 MG/ML IJ SOLN
80.0000 mg | Freq: Two times a day (BID) | INTRAMUSCULAR | Status: DC
  Administered 2023-12-02: 80 mg via INTRAVENOUS
  Filled 2023-12-02: qty 8

## 2023-12-02 MED ORDER — CAMPHOR-MENTHOL 0.5-0.5 % EX LOTN
TOPICAL_LOTION | CUTANEOUS | Status: DC | PRN
  Filled 2023-12-02: qty 222

## 2023-12-02 MED ORDER — LINEZOLID 600 MG PO TABS
600.0000 mg | ORAL_TABLET | Freq: Two times a day (BID) | ORAL | Status: AC
  Administered 2023-12-02 – 2023-12-08 (×13): 600 mg via ORAL
  Filled 2023-12-02 (×15): qty 1

## 2023-12-02 MED ORDER — METOLAZONE 2.5 MG PO TABS
2.5000 mg | ORAL_TABLET | Freq: Once | ORAL | Status: AC
Start: 2023-12-02 — End: 2023-12-02
  Administered 2023-12-02: 2.5 mg via ORAL
  Filled 2023-12-02: qty 1

## 2023-12-02 NOTE — Progress Notes (Signed)
  Progress Note  Patient Name: [name] [name] Date of Encounter: 12/02/2023 [facility] Cardiologist: None   Interval Summary   Slow progress w diuresis. Creatinine improved since yesterday.  Vital Signs Vitals:   12/02/23 0400 12/02/23 0500 12/02/23 0800 12/02/23 0831  BP:      Pulse:   (!) 101   Resp:   17   Temp: 99 F (37.2 C)   98.8 F (37.1 C)  TempSrc: Oral   Oral  SpO2:   95%   Weight:  130.1 kg    Height:        Intake/Output Summary (Last 24 hours) at 12/02/2023 0855 Last data filed at 12/02/2023 0054 Gross per 24 hour  Intake 554.57 ml  Output 1000 ml  Net -445.43 ml      12/02/2023    5:00 AM 12/01/2023    5:00 AM 11/30/2023    6:00 PM  Last 3 Weights  Weight (lbs) 286 lb 13.1 oz 288 lb 5.8 oz 297 lb 6.4 oz  Weight (kg) 130.1 kg 130.8 kg 134.9 kg      Telemetry/ECG  NSR - Personally Reviewed  Physical Exam  GEN: No acute distress.  Morbidly obese. Neck: unable to see JVD Cardiac: RRR, no murmurs, rubs, or gallops.  Respiratory: Clear to auscultation bilaterally. GI: Soft, nontender, non-distended  MS: generalized edema, but showing some wrinkling  Assessment & Plan   Mediocre diuresis. Increase furosemide  dose and add one dose of metolazone.    For questions or updates, please contact [facility] Please consult [email or web address] for contact info under         Signed, Jerel Balding, MD
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
## 2023-12-02 NOTE — TOC Initial Note (Signed)
 Transition of Care [facility]) - Initial/Assessment Note    Patient Details  Name: [name] [name] MRN: [id number] Date of Birth: 1955/06/24  Transition of [facility]) CM/SW Contact:    Sheri ONEIDA Sharps, LCSW Phone Number: 12/02/2023, 11:22 AM  Clinical Narrative:                 Pt from home w/ children. Pt continues medical workup. ICM following for dc needs.     Barriers to Discharge: Continued Medical Work up   Patient Goals and CMS Choice Patient states their goals for this hospitalization and ongoing recovery are:: return home   Choice offered to / list presented to : NA      Expected Discharge Plan and Services In-house Referral: NA Discharge Planning Services: NA   Living arrangements for the past 2 months: Apartment                 DME Arranged: N/A DME Agency: NA       HH Arranged: NA HH Agency: NA        Prior Living Arrangements/Services Living arrangements for the past 2 months: Apartment Lives with:: Adult Children Patient language and need for interpreter reviewed:: Yes Do you feel safe going back to the place where you live?: Yes      Need for Family Participation in Patient Care: Yes (Comment) Care giver support system in place?: Yes (comment) Current home services: Homehealth aide Criminal Activity/Legal Involvement Pertinent to Current Situation/Hospitalization: No - Comment as needed  Activities of Daily Living   ADL Screening (condition at time of admission) Independently performs ADLs?: No Does the patient have a NEW difficulty with bathing/dressing/toileting/self-feeding that is expected to last >3 days?: No Does the patient have a NEW difficulty with getting in/out of bed, walking, or climbing stairs that is expected to last >3 days?: No Does the patient have a NEW difficulty with communication that is expected to last >3 days?: No Is the patient deaf or have difficulty hearing?: No Does the patient have difficulty seeing, even when  wearing glasses/contacts?: No Does the patient have difficulty concentrating, remembering, or making decisions?: No  Permission Sought/Granted                  Emotional Assessment Appearance:: Appears stated age     Orientation: : Oriented to Situation, Oriented to  Time, Oriented to Place, Oriented to Self Alcohol  / Substance Use: Not Applicable Psych Involvement: No (comment)  Admission diagnosis:  Acute on chronic systolic congestive heart failure (HCC) [I50.23] Acute respiratory failure with hypoxia (HCC) [J96.01] Patient Active Problem List   Diagnosis Date Noted   Cellulitis of left breast 11/30/2023   Acute respiratory failure with hypoxia (HCC) 11/29/2023   Palliative care by specialist 04/02/2023   Goals of care, counseling/discussion 04/02/2023   Acute on chronic right heart failure (HCC) 04/01/2023   Obesity hypoventilation syndrome (HCC) 04/01/2023   Cardiogenic shock (HCC) 04/01/2023   Acute on chronic respiratory failure with hypoxia and hypercapnia (HCC) 03/27/2023   Acute hypoxic respiratory failure (HCC) 02/16/2023   Acute on chronic hypoxic respiratory failure (HCC) 01/14/2023   Acute on chronic heart failure with preserved ejection fraction (HFpEF) (HCC) 01/13/2023   Chronic respiratory failure with hypoxia (HCC) 01/13/2023   Chronic kidney disease, stage 3a (HCC) 10/02/2022   Chronic kidney disease, stage 3b (HCC) 10/02/2022   Hyperglycemia due to type 2 diabetes mellitus (HCC) 09/26/2021   Type 2 diabetes mellitus with hyperglycemia, with long-term current use  of insulin  (HCC) 09/25/2021   (HFpEF) heart failure with preserved ejection fraction (HCC) 09/25/2021   Hypomagnesemia 09/08/2021   Severe sepsis (HCC) 09/07/2021   Bacteremia due to Klebsiella pneumoniae 09/07/2021   Urinary tract infection 09/07/2021   Acute respiratory failure with hypoxia and hypercapnia (HCC) 09/04/2021   DKA (diabetic ketoacidosis) (HCC) 02/02/2021   Morbid obesity with  BMI of 50.0-59.9, adult (HCC) 02/02/2021   Hyperglycemia 02/01/2021   Pancreatitis 09/07/2020   Intractable nausea and vomiting 09/07/2020   Functional quadriplegia (HCC) 09/07/2020   General weakness 08/26/2020   OSA (obstructive sleep apnea) 08/26/2020   Constipation    AKI (acute kidney injury) 08/25/2020   Acute renal failure superimposed on stage 3b chronic kidney disease (HCC) 07/30/2020   Lower extremity cellulitis 02/04/2020   Acute on chronic diastolic heart failure (HCC) 11/23/2019   Vaginal discharge 11/06/2019   Morbid obesity (HCC) 08/22/2019   Opiate overdose (HCC) 08/20/2019   Cellulitis 10/05/2018   Acute cystitis without hematuria 10/05/2018   Chronic hypotension 10/05/2018   Pressure injury of skin 10/05/2018   Hypoglycemia secondary to sulfonylurea 11/21/2016   Substance induced mood disorder (HCC) 07/02/2014   Acute metabolic encephalopathy    Hyperkalemia    Hypokalemia    Insulin  dependent type 2 diabetes mellitus (HCC)    Hyperlipidemia associated with type 2 diabetes mellitus (HCC)    Anxiety and depression    Anxiety state    PCP:  Clinic, [facility] Pharmacy:   [facility] - [facility] [address] [place] [address] Phone: [phone] Fax: [phone]     Social Drivers of Health (SDOH) Social History: SDOH Screenings   Food Insecurity: No Food Insecurity (11/29/2023)  Housing: Low Risk  (11/29/2023)  Transportation Needs: No Transportation Needs (11/29/2023)  Utilities: Not At Risk (11/29/2023)  Alcohol  Screen: Low Risk  (03/29/2023)  Depression (PHQ2-9): Low Risk  (03/29/2023)  Financial Resource Strain: Patient Declined (05/16/2022)   Received from [facility]  Physical Activity: Inactive (03/29/2023)  Social Connections: Unknown (12/01/2023)  Stress: No Stress Concern Present (05/16/2022)   Received from [facility]  Tobacco Use: Medium Risk (12/01/2023)   SDOH Interventions:     Readmission Risk  Interventions    12/02/2023   11:20 AM 03/28/2023   11:29 AM 02/23/2023   11:48 AM  Readmission Risk Prevention Plan  Transportation Screening Complete Complete Complete  PCP or Specialist Appt within 3-5 Days   Complete  HRI or Home Care Consult   Complete  Social Work Consult for Recovery Care Planning/Counseling   Complete  Palliative Care Screening   Not Applicable  Medication Review Oceanographer) Complete Complete Complete  PCP or Specialist appointment within 3-5 days of discharge Complete    HRI or Home Care Consult Complete Complete   SW Recovery Care/Counseling Consult Complete Complete   Palliative Care Screening Not Applicable Not Applicable   Skilled Nursing Facility Not Applicable Not Applicable

## 2023-12-02 NOTE — Plan of Care (Signed)
 Patient has conjunctivitis in both eyes, drops order, patient continues to complain about pain and burning in her feet PRN medications given, plan of care and goals reviewed with patient with time given for questions, re enforcement needed, patient remains on 8 liters high flow oxygen  via nasal cannula which patient continues to take off, patient instructed that if she continues to remove oxygen  she would have to wear safety mitten to help remind her not to take off oxygen , patient also on fluid restriction for her CHF, patient hand book at bedside, bed in the lowest position with side rails up, bed alarm on, mat on floor and call bell in reach. Problem: Fluid Volume: Goal: Ability to maintain a balanced intake and output will improve Outcome: Progressing   Problem: Clinical Measurements: Goal: Will remain free from infection Outcome: Not Met (add Reason)    Problem: Education: Goal: Ability to describe self-care measures that may prevent or decrease complications (Diabetes Survival Skills Education) will improve Outcome: Progressing Goal: Individualized Educational Video(s) Outcome: Progressing   Problem: Coping: Goal: Ability to adjust to condition or change in health will improve Outcome: Progressing

## 2023-12-02 NOTE — Progress Notes (Signed)
 PROGRESS NOTE    [name] [name]  FMW:[id number] DOB: Aug 22, 1955 DOA: 11/29/2023 PCP: [facility]    Chief Complaint  Patient presents with   generalied edema     Brief Narrative:  [name] [name] is a 68 y.o. female with medical history significant for insulin -dependent type 2 diabetes, chronic heart failure with preserved EF, morbid obesity, bedbound, hypertension being admitted to the hospital with weight gain and peripheral edema due to suspected acute exacerbation of chronic heart failure with preserved EF.  Cardiology consulted.  Patient also with concerns for left breast cellulitis.   Assessment & Plan:   Principal Problem:   Acute respiratory failure with hypoxia (HCC) Active Problems:   Acute on chronic heart failure with preserved ejection fraction (HFpEF) (HCC)   Insulin  dependent type 2 diabetes mellitus (HCC)   Hyperlipidemia associated with type 2 diabetes mellitus (HCC)   Anxiety and depression   Morbid obesity (HCC)   OSA (obstructive sleep apnea)   Hyperglycemia due to type 2 diabetes mellitus (HCC)   Chronic kidney disease, stage 3b (HCC)   Obesity hypoventilation syndrome (HCC)   Cellulitis of left breast  #1 acute on chronic respiratory failure with hypoxia secondary to acute on chronic heart failure with HFpEF - Patient presented with a 2-week history of weight gain, worsening lower extremity edema, shortness of breath. - Patient noted to chronically be on 4 L nasal cannula. - Patient noted to have required BiPAP in the ER for comfort with improvement and currently on 7 L HFNC.  - Patient endorses compliance with diet and medications. - EKG with normal sinus rhythm, low voltage, no ischemic changes noted. - 2D echo from 03/2023 with a EF of 70 to 75%,NWMA, grade 1 DD, right ventricular systolic function severely reduced.  Severely enlarged right ventricular size. - Repeat 2D echo with EF of 60 to 65%,NWMA, concentric LVH.  Right ventricular  systolic function mildly reduced.  Right ventricular size mildly enlarged.  Very limited echo due to poor sound wave transmission.  Tech unable to obtain apical images due to patient discomfort.. - Patient noted to have received Lasix  60 mg IV x 1 in the ED and currently on Lasix  40 mg IV every 12 hours with a urine output of 1.150 L over the past 24 hours.  - Current weight of 286.82 pounds from 288.36 pounds from 297.4 pounds from 266.98 pounds.  -Lasix  dose increased this morning to 80 mg IV every 12 hours as well as 1 dose of metolazone ordered per cardiology. - Continue Lipitor. - Patient seen in consultation by cardiology, who feel patient is not a candidate for SGLT2 with obesity, bedbound status.   - Cardiology feels patient is RV failure due to sleep apnea, hypoxemia, obesity hypoventilation syndrome and noted to have been on torsemide  20 mg daily at home prior to admission.   -Per cardiology may consider RHC if patient continues to have RV failure. - Cardiology following and appreciate their input and recommendations.   2.  OSA with right-sided heart failure in the setting of suspected pulmonary hypertension/obesity hypoventilation syndrome - BiPAP nightly.  3.  CKD stage IIIb -Patient noted during last hospitalization to have creatinine peaked to 6.5 and required pressors as well as midodrine  and gentle IV fluid under supervision of nephrology during her February 2025 hospitalization. - Renal function appears close to baseline and fluctuates between 1.2-2.0. -Patient states no longer on midodrine . - Monitor renal function with diuresis.  4.  Insulin -dependent type 2 diabetes -  Hemoglobin A1c 8.8. - CBG 140 this morning. - Continue current dose of Lantus  50 units daily, SSI.  5.  Left breast cellulitis -Patient with concerns over left breast cellulitis with warmth, erythema, tenderness to palpation. - No significant discharge noted. - Ultrasound of left breast done with results  pending.  -MRSA PCR positive. - Continue IV Ancef . -Place on IV vancomycin . -Will have general surgery reassessed to see if any further recommendations are needed or I&D is needed. - Supportive care.  6.  History of chronic hypotension -Patient states no longer takes midodrine , blood pressure currently stable will need to monitor with diuresis. - If blood pressure drops and patient still requires diuresis may consider resumption of midodrine .  7.  Hyperlipidemia - Continue Lipitor.    8.  Morbid obesity -BMI 48.05. - Patient noted to be bedbound due to her prior history of lower extremity fracture. - Lifestyle modification. - Follow-up with PCP.  9.  Anxiety/depression - Zoloft.    DVT prophylaxis: Heparin  Code Status: Full Family Communication: Updated patient.  No family at bedside.  Disposition: Remain in stepdown unit.  Likely home once clinically improved, cleared by cardiology.  Status is: Inpatient Remains inpatient appropriate because: Severity of illness   Consultants:  Cardiology: Dr. Francyne 11/30/2023  Procedures:  2D echo 11/30/2023 Chest x-ray 11/29/2023 Ultrasound left breast 11/30/2023  Antimicrobials:  Anti-infectives (From admission, onward)    Start     Dose/Rate Route Frequency Ordered Stop   11/30/23 1200  ceFAZolin  (ANCEF ) IVPB 2g/100 mL premix        2 g 200 mL/hr over 30 Minutes Intravenous Every 8 hours 11/30/23 1049 12/07/23 1359         Subjective: Patient lying in bed.  States she feels shortness of breath has improved and states that is the first time she has been able to see if she as she feels lower extremity swelling has improved.  Denies any chest pain.  Still with complaints of left breast pain and feels may have an abscess.    Objective: Vitals:   12/02/23 0800 12/02/23 0831 12/02/23 0857 12/02/23 0900  BP:   120/63   Pulse: (!) 101  (!) 103 (!) 103  Resp: 17  (!) 24 19  Temp:  98.8 F (37.1 C)    TempSrc:  Oral    SpO2:  95%  (!) 88% 93%  Weight:      Height:        Intake/Output Summary (Last 24 hours) at 12/02/2023 0959 Last data filed at 12/02/2023 0054 Gross per 24 hour  Intake 554.57 ml  Output 1000 ml  Net -445.43 ml   Filed Weights   11/30/23 1800 12/01/23 0500 12/02/23 0500  Weight: 134.9 kg 130.8 kg 130.1 kg    Examination:  General exam: NAD. Respiratory system: Decreased breath sounds in the bases.  Diffuse crackles.  No wheezing.  Speaking in full sentences.  No use of accessory muscles of respiration.  On 7 L high flow nasal cannula. Cardiovascular system: RRR no murmurs rubs or gallops.  Unable to assess JVD due to body habitus.  2+ bilateral lower extremity edema.  Gastrointestinal system: Abdomen is obese, nondistended, soft, nontender, positive bowel sounds.  No rebound.  No guarding. Central nervous system: Alert and oriented. No focal neurological deficits. Extremities: Symmetric 5 x 5 power. Skin: Left breast cellulitis with slightly decreased erythema, still with significant tenderness to palpation, some induration around the areola.[name]  Psychiatry: Judgement and insight appear normal. Mood &  affect appropriate.     Data Reviewed: I have personally reviewed following labs and imaging studies  CBC: Recent Labs  Lab 11/29/23 1400 11/30/23 0300 12/01/23 0316 12/02/23 0242  WBC 5.8 5.8 6.1 6.6  NEUTROABS 3.2  --  3.4  --   HGB 12.9 12.4 12.2 12.7  HCT 48.5* 48.8* 48.1* 50.0*  MCV 79.8* 81.5 81.5 82.1  PLT 272 376 376 319    Basic Metabolic Panel: Recent Labs  Lab 11/29/23 1531 11/30/23 0300 12/01/23 0316 12/02/23 0242  NA 143 145 144 141  K 4.7 4.5 4.2 4.3  CL 102 104 103 100  CO2 31 31 31 30   GLUCOSE 97 76 104* 132*  BUN 29* 29* 27* 26*  CREATININE 1.94* 1.98* 2.13* 1.99*  CALCIUM  9.9 9.8 9.2 9.5  MG  --   --  1.8 1.8  PHOS  --   --  4.7*  --     GFR: Estimated Creatinine Clearance: 34.4 mL/min (A) (by C-G formula based on SCr of 1.99 mg/dL  (H)).  Liver Function Tests: Recent Labs  Lab 11/29/23 1531 12/01/23 0316  AST 15  --   ALT 10  --   ALKPHOS 210*  --   BILITOT 0.6  --   PROT 7.4  --   ALBUMIN  3.9 3.6    CBG: Recent Labs  Lab 12/01/23 0758 12/01/23 1118 12/01/23 1621 12/01/23 2206 12/02/23 0755  GLUCAP 115* 154* 138* 151* 140*     Recent Results (from the past 240 hours)  MRSA Next Gen by PCR, Nasal     Status: Abnormal   Collection Time: 11/29/23  7:06 PM   Specimen: Nasal Mucosa; Nasal Swab  Result Value Ref Range Status   MRSA by PCR Next Gen DETECTED (A) NOT DETECTED Final    Comment: (NOTE) The GeneXpert MRSA Assay (FDA approved for NASAL specimens only), is one component of a comprehensive MRSA colonization surveillance program. It is not intended to diagnose MRSA infection nor to guide or monitor treatment for MRSA infections. Test performance is not FDA approved in patients less than 97 years old. Performed at [facility], [address] [place], [place] [address]          Radiology Studies: No results found.       Scheduled Meds:  allopurinol   100 mg Oral QHS   atorvastatin   10 mg Oral QHS   calcitRIOL  0.25 mcg Oral Q M,W,F   Chlorhexidine  Gluconate Cloth  6 each Topical Daily   furosemide   80 mg Intravenous Q12H   heparin   5,000 Units Subcutaneous Q8H   insulin  aspart  0-20 Units Subcutaneous TID WC   insulin  aspart  0-5 Units Subcutaneous QHS   insulin  glargine  50 Units Subcutaneous Daily   loratadine   10 mg Oral Daily   metolazone  2.5 mg Oral Once   mupirocin  ointment  1 Application Nasal BID   nystatin  cream  1 Application Topical BID   oxyCODONE   10 mg Oral BID   pantoprazole   40 mg Oral QAC breakfast   polyethylene glycol  17 g Oral Daily   potassium chloride  SA  20 mEq Oral Daily   pregabalin   75 mg Oral BID   senna-docusate  1 tablet Oral BID   sertraline  50 mg Oral q AM   triamcinolone  1 Application Topical Daily   Continuous  Infusions:   ceFAZolin  (ANCEF ) IV Stopped (12/02/23 0623)     LOS: 3 days  Time spent: 45 minutes    Toribio Hummer, MD [facility]   To contact the attending provider between 7A-7P or the covering provider during after hours 7P-7A, please log into the web site [email or web address] and access using universal [place] password for that web site. If you do not have the password, please call the hospital operator.  12/02/2023, 9:59 AM

## 2023-12-02 NOTE — Progress Notes (Signed)
 This nurse and two other nurses(Darcy and the Charge nurse Genell) were able to get the patient to take her medications, several attempts were made to make patient comfortable, warm packs, foot message, Prevalon boot and cream, patient also instructed to give medications and warm packs time to work, however patient requested that warm packs and boots be removed due to she could not stand them being on her feet, which at that time warm packs and boots were removed and patient reposition in the bed. On call provider notified about patients conjunctivitis in both eyes, awaiting orders.

## 2023-12-02 NOTE — Progress Notes (Signed)
 Patient yelling and screaming in room, states that her foot is burning and itching, along with her restless ley syndrome, patient ask repeatedly if she wanted her Requip, and again patient continued to refuse, patient foot messaged and cream applied however patient continue to state this nurse was not doing it right, two other nurses and the nurse tech each went in to message patients foot however patient continued to complain and scream. Charge nurse made aware.

## 2023-12-02 NOTE — Progress Notes (Signed)
 Patient order Requip for her restless syndrome, however patient refuses to take medication, patient is alert to herself, patient keeps falling asleep while you are talking to her, This nurse ask her a total of eight times if she wanted to take her medication for her symptoms however she continued to refuse the medication.

## 2023-12-02 NOTE — Plan of Care (Signed)
  Problem: Fluid Volume: Goal: Ability to maintain a balanced intake and output will improve Outcome: Progressing   Problem: Metabolic: Goal: Ability to maintain appropriate glucose levels will improve Outcome: Progressing   Problem: Skin Integrity: Goal: Risk for impaired skin integrity will decrease Outcome: Not Progressing   Problem: Clinical Measurements: Goal: Respiratory complications will improve Outcome: Not Progressing   Problem: Activity: Goal: Risk for activity intolerance will decrease Outcome: Not Progressing

## 2023-12-03 ENCOUNTER — Other Ambulatory Visit: Payer: Self-pay

## 2023-12-03 ENCOUNTER — Inpatient Hospital Stay (HOSPITAL_COMMUNITY)

## 2023-12-03 DIAGNOSIS — E1169 Type 2 diabetes mellitus with other specified complication: Secondary | ICD-10-CM | POA: Diagnosis not present

## 2023-12-03 DIAGNOSIS — N61 Mastitis without abscess: Secondary | ICD-10-CM | POA: Diagnosis not present

## 2023-12-03 DIAGNOSIS — J9601 Acute respiratory failure with hypoxia: Secondary | ICD-10-CM | POA: Diagnosis not present

## 2023-12-03 DIAGNOSIS — G4733 Obstructive sleep apnea (adult) (pediatric): Secondary | ICD-10-CM | POA: Diagnosis not present

## 2023-12-03 LAB — BASIC METABOLIC PANEL WITH GFR
Anion gap: 11 (ref 5–15)
BUN: 27 mg/dL — ABNORMAL HIGH (ref 8–23)
CO2: 30 mmol/L (ref 22–32)
Calcium: 9.4 mg/dL (ref 8.9–10.3)
Chloride: 103 mmol/L (ref 98–111)
Creatinine, Ser: 2.23 mg/dL — ABNORMAL HIGH (ref 0.44–1.00)
GFR, Estimated: 23 mL/min — ABNORMAL LOW (ref >60)
Glucose, Bld: 62 mg/dL — ABNORMAL LOW (ref 70–99)
Potassium: 4.5 mmol/L (ref 3.5–5.1)
Sodium: 144 mmol/L (ref 135–145)

## 2023-12-03 LAB — CBC WITH DIFFERENTIAL/PLATELET
Abs Immature Granulocytes: 0.06 K/uL (ref 0.00–0.07)
Basophils Absolute: 0 K/uL (ref 0.0–0.1)
Basophils Relative: 0 %
Eosinophils Absolute: 0.2 K/uL (ref 0.0–0.5)
Eosinophils Relative: 2 %
HCT: 42.3 % (ref 36.0–46.0)
Hemoglobin: 11 g/dL — ABNORMAL LOW (ref 12.0–15.0)
Immature Granulocytes: 1 %
Lymphocytes Relative: 9 %
Lymphs Abs: 0.9 K/uL (ref 0.7–4.0)
MCH: 21.7 pg — ABNORMAL LOW (ref 26.0–34.0)
MCHC: 26 g/dL — ABNORMAL LOW (ref 30.0–36.0)
MCV: 83.6 fL (ref 80.0–100.0)
Monocytes Absolute: 0.8 K/uL (ref 0.1–1.0)
Monocytes Relative: 8 %
Neutro Abs: 8 K/uL — ABNORMAL HIGH (ref 1.7–7.7)
Neutrophils Relative %: 80 %
Platelets: 280 K/uL (ref 150–400)
RBC: 5.06 MIL/uL (ref 3.87–5.11)
RDW: 21.8 % — ABNORMAL HIGH (ref 11.5–15.5)
WBC: 10 K/uL (ref 4.0–10.5)
nRBC: 0 % (ref 0.0–0.2)

## 2023-12-03 LAB — GLUCOSE, CAPILLARY
Glucose-Capillary: 61 mg/dL — ABNORMAL LOW (ref 70–99)
Glucose-Capillary: 70 mg/dL (ref 70–99)
Glucose-Capillary: 75 mg/dL (ref 70–99)
Glucose-Capillary: 78 mg/dL (ref 70–99)
Glucose-Capillary: 96 mg/dL (ref 70–99)
Glucose-Capillary: 98 mg/dL (ref 70–99)

## 2023-12-03 LAB — URIC ACID: Uric Acid, Serum: 4.9 mg/dL (ref 2.5–7.1)

## 2023-12-03 LAB — COOXEMETRY PANEL
Carboxyhemoglobin: 1.9 % — ABNORMAL HIGH (ref 0.5–1.5)
Methemoglobin: <0.7 % (ref 0.0–1.5)
O2 Saturation: 73.3 %
Total hemoglobin: 11.4 g/dL — ABNORMAL LOW (ref 12.0–16.0)

## 2023-12-03 LAB — AMMONIA: Ammonia: 27 umol/L (ref 9–35)

## 2023-12-03 MED ORDER — INSULIN GLARGINE 100 UNIT/ML ~~LOC~~ SOLN
25.0000 [IU] | Freq: Every day | SUBCUTANEOUS | Status: DC
  Administered 2023-12-03 – 2023-12-04 (×2): 25 [IU] via SUBCUTANEOUS
  Filled 2023-12-03 (×2): qty 0.25

## 2023-12-03 MED ORDER — FUROSEMIDE 10 MG/ML IJ SOLN
120.0000 mg | Freq: Two times a day (BID) | INTRAVENOUS | Status: DC
  Administered 2023-12-03: 120 mg via INTRAVENOUS
  Filled 2023-12-03: qty 12
  Filled 2023-12-03: qty 10

## 2023-12-03 MED ORDER — SODIUM CHLORIDE 0.9% FLUSH
10.0000 mL | Freq: Two times a day (BID) | INTRAVENOUS | Status: DC
  Administered 2023-12-03 – 2023-12-04 (×3): 10 mL
  Administered 2023-12-04: 20 mL
  Administered 2023-12-05 – 2023-12-07 (×6): 10 mL
  Administered 2023-12-08: 20 mL
  Administered 2023-12-08: 10 mL
  Administered 2023-12-09: 20 mL
  Administered 2023-12-09: 30 mL
  Administered 2023-12-10: 20 mL
  Administered 2023-12-10: 10 mL
  Administered 2023-12-11 (×2): 20 mL
  Administered 2023-12-12: 30 mL
  Administered 2023-12-12: 20 mL
  Administered 2023-12-13: 10 mL
  Administered 2023-12-13: 30 mL
  Administered 2023-12-14: 10 mL
  Administered 2023-12-14: 30 mL
  Administered 2023-12-15 – 2023-12-17 (×5): 10 mL
  Administered 2023-12-17: 20 mL
  Administered 2023-12-18 (×2): 10 mL
  Administered 2023-12-19: 20 mL
  Administered 2023-12-19 – 2023-12-27 (×16): 10 mL
  Administered 2023-12-27: 20 mL
  Administered 2023-12-28: 10 mL

## 2023-12-03 MED ORDER — FUROSEMIDE 10 MG/ML IJ SOLN: 120.0000 mg | Freq: Two times a day (BID) | INTRAVENOUS | Status: DC

## 2023-12-03 MED ORDER — METOLAZONE 5 MG PO TABS
5.0000 mg | ORAL_TABLET | Freq: Once | ORAL | Status: AC
  Administered 2023-12-03: 5 mg via ORAL
  Filled 2023-12-03: qty 1

## 2023-12-03 MED ORDER — FUROSEMIDE 10 MG/ML IJ SOLN
20.0000 mg/h | INTRAVENOUS | Status: DC
  Administered 2023-12-03 – 2023-12-10 (×14): 20 mg/h via INTRAVENOUS
  Filled 2023-12-03 (×19): qty 20

## 2023-12-03 MED ORDER — MILRINONE LACTATE IN DEXTROSE 20-5 MG/100ML-% IV SOLN
0.2500 ug/kg/min | INTRAVENOUS | Status: DC
  Administered 2023-12-03 – 2023-12-18 (×30): 0.25 ug/kg/min via INTRAVENOUS
  Filled 2023-12-03 (×32): qty 100

## 2023-12-03 MED ORDER — SODIUM CHLORIDE 0.9% FLUSH: 10.0000 mL | INTRAVENOUS | Status: DC | PRN

## 2023-12-03 MED ORDER — DEXTROSE 50 % IV SOLN
INTRAVENOUS | Status: AC
  Administered 2023-12-03: 25 mL
  Filled 2023-12-03: qty 50

## 2023-12-03 MED ORDER — PREGABALIN 75 MG PO CAPS
75.0000 mg | ORAL_CAPSULE | Freq: Three times a day (TID) | ORAL | Status: DC
  Administered 2023-12-04 – 2023-12-05 (×3): 75 mg via ORAL
  Filled 2023-12-03 (×5): qty 1

## 2023-12-03 NOTE — Progress Notes (Signed)
 Mediocre urine output despite furosemide  120 mg IV and metolazone 5 mg PO. After starting IV milrinone, CVP 30 and mixed venous O2 sat 73%.  Change to furosemide  continuous infusion. May need acetazolamide for metabolic alkalosis (but she has baseline high bicarb due to chronic obesity hypoventilation sd).

## 2023-12-03 NOTE — Progress Notes (Signed)
 Patient ID: [name] [name], female   DOB: Apr 23, 1955, 68 y.o.   MRN: [id number]  Still with left breast tenderness  Left breast still dependent under her axilla - thickened, but no obvious erythema or fluctuance  Discussed with nursing about elevating the left breast on a stack of towels.  No acute surgical indications  [name] [name]. Belinda, MD, [facility] Surgery  General Surgery   12/03/2023 9:08 AM

## 2023-12-03 NOTE — Progress Notes (Signed)
 Peripherally Inserted Central Catheter Placement  The IV Nurse has discussed with the patient and/or persons authorized to consent for the patient, the purpose of this procedure and the potential benefits and risks involved with this procedure.  The benefits include less needle sticks, lab draws from the catheter, and the patient may be discharged home with the catheter. Risks include, but not limited to, infection, bleeding, blood clot (thrombus formation), and puncture of an artery; nerve damage and irregular heartbeat and possibility to perform a PICC exchange if needed/ordered by physician.  Alternatives to this procedure were also discussed.  Bard Power PICC patient education guide, fact sheet on infection prevention and patient information card has been provided to patient /or left at bedside.  Unable to contact son.  Pt able to answer orientation questions and states has had a PICC before.  Verbalizes understanding of conversation. Requests for verbal consent due to weakness in signing.  Witnessed by Norleen PEAK.  PICC Placement Documentation  PICC Triple Lumen 12/03/23 Right Brachial 44 cm 0 cm (Active)  Indication for Insertion or Continuance of Line Vasoactive infusions;Chronic illness with exacerbations (CF, Sickle Cell, etc.);Limited venous access - need for IV therapy >5 days (PICC only) 12/03/23 1220  Exposed Catheter (cm) 0 cm 12/03/23 1220  Site Assessment Clean, Dry, Intact 12/03/23 1220  Lumen #1 Status Flushed;Saline locked;Blood return noted 12/03/23 1220  Lumen #2 Status Flushed;Saline locked;Blood return noted 12/03/23 1220  Lumen #3 Status Flushed;Saline locked;Blood return noted 12/03/23 1220  Dressing Type Transparent;Securing device 12/03/23 1220  Dressing Status Antimicrobial disc/dressing in place;Clean, Dry, Intact 12/03/23 1220  Line Care Connections checked and tightened 12/03/23 1220  Line Adjustment (NICU/IV Team Only) No 12/03/23 1220  Dressing Intervention New  dressing;Adhesive placed at insertion site (IV team only);Adhesive placed around edges of dressing (IV team/ICU RN only) 12/03/23 1220  Dressing Change Due 12/10/23 12/03/23 1220       Matilde Zebedee Silvan 12/03/2023, 12:21 PM

## 2023-12-03 NOTE — Progress Notes (Signed)
   12/03/23 2118  BiPAP/CPAP/SIPAP  BiPAP/CPAP/SIPAP Pt Type Adult  BiPAP/CPAP/SIPAP V60  Mask Type Full face mask  Dentures removed? Not applicable  Mask Size Medium  Set Rate 16 breaths/min  Respiratory Rate 16 breaths/min  IPAP 22 cmH20 (increased due to low VT)  EPAP 10 cmH2O  FiO2 (%) 50 %  Flow Rate 0.91 lpm  Minute Ventilation 5.8  Leak 2  Peak Inspiratory Pressure (PIP) 22  Tidal Volume (Vt) 403  Patient Home Machine No  Patient Home Mask No  Patient Home Tubing No  Auto Titrate No  Press High Alarm 30 cmH2O  Press Low Alarm 5 cmH2O  Nasal massage performed No (comment)  Device Plugged into RED Power Outlet Yes  BiPAP/CPAP /SiPAP Vitals  Pulse Rate (!) 107  Resp (!) 21  SpO2 92 %  MEWS Score/Color  MEWS Score 2  MEWS Score Color Yellow

## 2023-12-03 NOTE — Progress Notes (Addendum)
 Have attempted multiple times to call son for PICC consent, no answer. No message left. Secure chat sent to Dr Kennyth and Dr Sebastian and Norleen RN to notify of attempts. Pt with intermittent confusion per RN.

## 2023-12-03 NOTE — Progress Notes (Signed)
  Progress Note  Patient Name: GRACIN SOOHOO Date of Encounter: 12/03/2023 [facility] Cardiologist: None   Interval Summary   We have not made progress with diuresis despite loop diuretic IV + metolazone PO combo. Slight worsening of the renal function.  Vital Signs Vitals:   12/03/23 0500 12/03/23 0600 12/03/23 0700 12/03/23 0800  BP: (!) 152/102 (!) 143/70 (!) 156/112 (!) 142/120  Pulse: 95 95 96 94  Resp: 13 13 (!) 25 10  Temp:      TempSrc:      SpO2: 93% 93% 90% 99%  Weight: 131.3 kg     Height:        Intake/Output Summary (Last 24 hours) at 12/03/2023 0809 Last data filed at 12/03/2023 0517 Gross per 24 hour  Intake 276.63 ml  Output 1000 ml  Net -723.37 ml      12/03/2023    5:00 AM 12/02/2023    5:00 AM 12/01/2023    5:00 AM  Last 3 Weights  Weight (lbs) 289 lb 7.4 oz 286 lb 13.1 oz 288 lb 5.8 oz  Weight (kg) 131.3 kg 130.1 kg 130.8 kg      Telemetry/ECG  NSR - Personally Reviewed  Physical Exam  GEN: No acute distress.  Morbid obesity. Neck: No JVD Cardiac: RRR, no murmurs, rubs, or gallops.  Respiratory: Clear to auscultation bilaterally. GI: Soft, nontender, non-distended  MS: generalized edema, worse in the left breast   Assessment & Plan  68 yo bed-bound patient with morbid obesity, chronic respiratory failure on home O2, presents with acute on chronic R heart failure due to cor pulmonale (obesity-hypoventilation sd and OSA), on background of CKD 3B and insulin  requiring DM2.   Poor response to loop diuretic IV + thiazide diuretic.  Reviewed the echo. In my opinion the right ventricle is severely depressed. Suspect cardiac output is limited by high pulmonary vascular resistance and low cardiac output.  Start IV milrinone. Would like to have a PICC line. Central line would be high risk for complications with her body habitus and she is not a good cndidate for chronic hemodialysis.    For questions or updates, please contact  [facility] Please consult [email or web address] for contact info under         Signed, Jerel Balding, MD

## 2023-12-03 NOTE — Progress Notes (Signed)
   12/03/23 0007  BiPAP/CPAP/SIPAP  $ Non-Invasive Home Ventilator  Subsequent  BiPAP/CPAP/SIPAP Pt Type Adult  BiPAP/CPAP/SIPAP Resmed  Mask Type Full face mask  Mask Size Medium  IPAP 18 cmH20  EPAP 10 cmH2O  Flow Rate 15 lpm  Patient Home Machine No  Patient Home Mask No  Patient Home Tubing No  Auto Titrate No  Device Plugged into RED Power Outlet Yes  BiPAP/CPAP /SiPAP Vitals  Pulse Rate 91  Resp 10  BP 90/66  SpO2 93 %  Bilateral Breath Sounds Diminished  MEWS Score/Color  MEWS Score 2  MEWS Score Color Yellow

## 2023-12-03 NOTE — Progress Notes (Signed)
 PROGRESS NOTE    [name] [name]  FMW:[id number] DOB: 06-12-1955 DOA: 11/29/2023 PCP: [facility]    Chief Complaint  Patient presents with   generalied edema     Brief Narrative:  [name] [name] is a 68 y.o. female with medical history significant for insulin -dependent type 2 diabetes, chronic heart failure with preserved EF, morbid obesity, bedbound, hypertension being admitted to the hospital with weight gain and peripheral edema due to suspected acute exacerbation of chronic heart failure with preserved EF.  Cardiology consulted.  Patient also with concerns for left breast cellulitis.   Assessment & Plan:   Principal Problem:   Acute respiratory failure with hypoxia (HCC) Active Problems:   Acute on chronic heart failure with preserved ejection fraction (HFpEF) (HCC)   Insulin  dependent type 2 diabetes mellitus (HCC)   Hyperlipidemia associated with type 2 diabetes mellitus (HCC)   Anxiety and depression   Morbid obesity (HCC)   OSA (obstructive sleep apnea)   Hyperglycemia due to type 2 diabetes mellitus (HCC)   Chronic kidney disease, stage 3b (HCC)   Obesity hypoventilation syndrome (HCC)   Cellulitis of left breast  #1 acute on chronic respiratory failure with hypoxia secondary to acute on chronic heart failure with HFpEF - Patient presented with a 2-week history of weight gain, worsening lower extremity edema, shortness of breath. - Patient noted to chronically be on 4 L nasal cannula. - Patient noted to have required BiPAP in the ER for comfort with initially some clinical improvement however patient currently on 8 L high flow nasal cannula with sats of 99%.   - Patient endorses compliance with diet and medications prior to admission. - EKG with normal sinus rhythm, low voltage, no ischemic changes noted. - 2D echo from 03/2023 with a EF of 70 to 75%,NWMA, grade 1 DD, right ventricular systolic function severely reduced.  Severely enlarged right  ventricular size. - Repeat 2D echo with EF of 60 to 65%,NWMA, concentric LVH.  Right ventricular systolic function mildly reduced.  Right ventricular size mildly enlarged.  Very limited echo due to poor sound wave transmission.  Tech unable to obtain apical images due to patient discomfort.. - Patient noted to have received Lasix  60 mg IV x 1 in the ED and was on Lasix  40 mg IV every 12 hours and diuretics increased to Lasix  80 mg IV every 12 hours and received a dose of metolazone on 12/02/2023 with a urine output of 1 L over the past 24 hours. - Current weight of 289.46 pounds from 286.82 pounds from 288.36 pounds from 297.4 pounds from 266.98 pounds.  - Patient with no significant diuresis and as such Lasix  dose has been increased per cardiology 220 mg IV every 12 hours, PICC line ordered and milrinone ordered per cardiology.  - Continue Lipitor. - Patient seen in consultation by cardiology, who feel patient is not a candidate for SGLT2 with obesity, bedbound status.   - Cardiology feels patient is RV failure due to sleep apnea, hypoxemia, obesity hypoventilation syndrome and noted to have been on torsemide  20 mg daily at home prior to admission.   -Per cardiology may consider RHC if patient continues to have RV failure. - Cardiology following and appreciate their input and recommendations.   2.  OSA with right-sided heart failure in the setting of suspected pulmonary hypertension/obesity hypoventilation syndrome - BiPAP nightly.  3.  CKD stage IIIb -Patient noted during last hospitalization to have creatinine peaked to 6.5 and required pressors as well  as midodrine  and gentle IV fluid under supervision of nephrology during her February 2025 hospitalization. - Renal function appears close to baseline and fluctuates between 1.2-2.0. -Patient states no longer on midodrine . - Monitor renal function with diuresis.  4.  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 - CBG  96 this morning. - Decrease Lantus  to 25 units daily.  SSI.   - Patient with increasing pain in her feet with complaints of worsening neuropathy.   - Uric acid level within normal limits.   - Increase Lyrica  to 75 mg 3 times daily. - Sarna lotion as needed.  5.  Left breast cellulitis -Patient with concerns over left breast cellulitis with warmth, erythema, tenderness to palpation. - No significant discharge noted. - Ultrasound of left breast done with results pending.  -MRSA PCR positive. -Patient seen in consultation by general surgery who feel patient likely with dependent edema in the setting of CHF and patient predominantly laying more on her left side. -Continue IV Ancef  and linezolid . -Continue IV diuresis as per cardiology. -General Surgery recommending nursing placed several folded towels under left breast to elevated. -Appreciate general surgery input and recommendations.  6.  History of chronic hypotension -Patient states no longer takes midodrine , blood pressure currently stable will need to monitor with diuresis. - If blood pressure drops and patient still requires diuresis may consider resumption of midodrine .  7.  Hyperlipidemia - Lipitor.   8.  Morbid obesity -BMI 48.05. - Patient noted to be bedbound due to her prior history of lower extremity fracture. - Lifestyle modification. - Follow-up with PCP.  9.  Anxiety/depression - Continue Zoloft.  #10. ??  Asterixis - Uric acid level within normal limits. - Slight bump in renal function however close to baseline BUN of 27. - Check an ammonia level. - If ammonia level elevated we will give a dose of lactulose.   DVT prophylaxis: Heparin  Code Status: Full Family Communication: Updated patient.  No family at bedside.  Disposition: Transfer to ICU.  Home when clinically improved and cleared by cardiology.   Status is: Inpatient Remains inpatient appropriate because: Severity of illness   Consultants:   Cardiology: Dr. Francyne 11/30/2023 General Surgery: Dr.Tsuei 12/02/2023  Procedures:  2D echo 11/30/2023 Chest x-ray 11/29/2023 Ultrasound left breast 11/30/2023  Antimicrobials:  Anti-infectives (From admission, onward)    Start     Dose/Rate Route Frequency Ordered Stop   12/02/23 1200  linezolid  (ZYVOX ) tablet 600 mg        600 mg Oral Every 12 hours 12/02/23 1104     11/30/23 1200  ceFAZolin  (ANCEF ) IVPB 2g/100 mL premix        2 g 200 mL/hr over 30 Minutes Intravenous Every 8 hours 11/30/23 1049 12/07/23 1359         Subjective: Patient laying in bed.  Patient with complaints of significant neuropathy in the feet with significant burning.  Patient denies any chest pain.  No significant improvement with her shortness of breath.  No abdominal pain.  Still with left breast pain and swelling.    Objective: Vitals:   12/03/23 0600 12/03/23 0700 12/03/23 0800 12/03/23 0905  BP: (!) 143/70 (!) 156/112 (!) 142/120   Pulse: 95 96 94   Resp: 13 (!) 25 10   Temp:    97.9 F (36.6 C)  TempSrc:    Oral  SpO2: 93% 90% 99%   Weight:      Height:        Intake/Output Summary (  Last 24 hours) at 12/03/2023 0953 Last data filed at 12/03/2023 0517 Gross per 24 hour  Intake 276.63 ml  Output 1000 ml  Net -723.37 ml   Filed Weights   12/01/23 0500 12/02/23 0500 12/03/23 0500  Weight: 130.8 kg 130.1 kg 131.3 kg    Examination:  General exam: NAD. Respiratory system: Decreased breath sounds in the bases.  Diffuse crackles.  No wheezing.  Speaking in full sentences.  No use of accessory muscles of respiration.  On 8 L high flow nasal cannula.   Cardiovascular system: Regular rate rhythm no murmurs rubs or gallops.  Unable to assess JVD due to body habitus.  2+ bilateral lower extremity edema.   Gastrointestinal system: Abdomen is obese, nondistended, soft, nontender, positive bowel sounds.  No rebound.  No guarding. Central nervous system: Alert and oriented. No focal neurological  deficits. Extremities: Slight asterixis noted.  Symmetric 5 x 5 power. Skin: Left breast cellulitis with slightly decreased erythema, still with significant tenderness to palpation, some induration around the areola.[name]  Psychiatry: Judgement and insight appear normal. Mood & affect appropriate.     Data Reviewed: I have personally reviewed following labs and imaging studies  CBC: Recent Labs  Lab 11/29/23 1400 11/30/23 0300 12/01/23 0316 12/02/23 0242 12/03/23 0252  WBC 5.8 5.8 6.1 6.6 10.0  NEUTROABS 3.2  --  3.4  --  8.0*  HGB 12.9 12.4 12.2 12.7 11.0*  HCT 48.5* 48.8* 48.1* 50.0* 42.3  MCV 79.8* 81.5 81.5 82.1 83.6  PLT 272 376 376 319 280    Basic Metabolic Panel: Recent Labs  Lab 11/29/23 1531 11/30/23 0300 12/01/23 0316 12/02/23 0242 12/03/23 0252  NA 143 145 144 141 144  K 4.7 4.5 4.2 4.3 4.5  CL 102 104 103 100 103  CO2 31 31 31 30 30   GLUCOSE 97 76 104* 132* 62*  BUN 29* 29* 27* 26* 27*  CREATININE 1.94* 1.98* 2.13* 1.99* 2.23*  CALCIUM  9.9 9.8 9.2 9.5 9.4  MG  --   --  1.8 1.8  --   PHOS  --   --  4.7*  --   --     GFR: Estimated Creatinine Clearance: 30.9 mL/min (A) (by C-G formula based on SCr of 2.23 mg/dL (H)).  Liver Function Tests: Recent Labs  Lab 11/29/23 1531 12/01/23 0316  AST 15  --   ALT 10  --   ALKPHOS 210*  --   BILITOT 0.6  --   PROT 7.4  --   ALBUMIN  3.9 3.6    CBG: Recent Labs  Lab 12/02/23 0755 12/02/23 1154 12/02/23 1612 12/02/23 2135 12/03/23 0814  GLUCAP 140* 137* 88 87 96     Recent Results (from the past 240 hours)  MRSA Next Gen by PCR, Nasal     Status: Abnormal   Collection Time: 11/29/23  7:06 PM   Specimen: Nasal Mucosa; Nasal Swab  Result Value Ref Range Status   MRSA by PCR Next Gen DETECTED (A) NOT DETECTED Final    Comment: (NOTE) The GeneXpert MRSA Assay (FDA approved for NASAL specimens only), is one component of a comprehensive MRSA colonization surveillance program. It is not intended to  diagnose MRSA infection nor to guide or monitor treatment for MRSA infections. Test performance is not FDA approved in patients less than 86 years old. Performed at [facility], [address], [place], [place] [address]          Radiology Studies: US  EKG SITE  RITE Result Date: 12/03/2023 If Site Rite image not attached, placement could not be confirmed due to current cardiac rhythm.        Scheduled Meds:  allopurinol   100 mg Oral QHS   atorvastatin   10 mg Oral QHS   calcitRIOL  0.25 mcg Oral Q M,W,F   Chlorhexidine  Gluconate Cloth  6 each Topical Daily   heparin   5,000 Units Subcutaneous Q8H   insulin  aspart  0-20 Units Subcutaneous TID WC   insulin  aspart  0-5 Units Subcutaneous QHS   insulin  glargine  25 Units Subcutaneous Daily   linezolid   600 mg Oral Q12H   loratadine   10 mg Oral Daily   metolazone  5 mg Oral Once   mupirocin  ointment  1 Application Nasal BID   nystatin  cream  1 Application Topical BID   oxyCODONE   10 mg Oral BID   pantoprazole   40 mg Oral QAC breakfast   polyethylene glycol  17 g Oral Daily   potassium chloride  SA  20 mEq Oral Daily   pregabalin   75 mg Oral BID   rOPINIRole  0.25 mg Oral Once   senna-docusate  1 tablet Oral BID   sertraline  50 mg Oral q AM   triamcinolone  1 Application Topical Daily   Continuous Infusions:   ceFAZolin  (ANCEF ) IV Stopped (12/03/23 0557)   furosemide      milrinone       LOS: 4 days    Time spent: 45 minutes    Toribio Hummer, MD [facility]   To contact the attending provider between 7A-7P or the covering provider during after hours 7P-7A, please log into the web site [email or web address] and access using universal [place] password for that web site. If you do not have the password, please call the hospital operator.  12/03/2023, 9:53 AM

## 2023-12-04 DIAGNOSIS — J441 Chronic obstructive pulmonary disease with (acute) exacerbation: Secondary | ICD-10-CM | POA: Insufficient documentation

## 2023-12-04 DIAGNOSIS — N61 Mastitis without abscess: Secondary | ICD-10-CM | POA: Diagnosis not present

## 2023-12-04 DIAGNOSIS — G4733 Obstructive sleep apnea (adult) (pediatric): Secondary | ICD-10-CM | POA: Diagnosis not present

## 2023-12-04 DIAGNOSIS — J9601 Acute respiratory failure with hypoxia: Secondary | ICD-10-CM | POA: Diagnosis not present

## 2023-12-04 DIAGNOSIS — I50813 Acute on chronic right heart failure: Secondary | ICD-10-CM | POA: Diagnosis not present

## 2023-12-04 DIAGNOSIS — E1169 Type 2 diabetes mellitus with other specified complication: Secondary | ICD-10-CM | POA: Diagnosis not present

## 2023-12-04 LAB — COOXEMETRY PANEL
Carboxyhemoglobin: 2.3 % — ABNORMAL HIGH (ref 0.5–1.5)
Methemoglobin: <0.7 % (ref 0.0–1.5)
O2 Saturation: 79.5 %
Total hemoglobin: 10.4 g/dL — ABNORMAL LOW (ref 12.0–16.0)

## 2023-12-04 LAB — MAGNESIUM: Magnesium: 1.7 mg/dL (ref 1.7–2.4)

## 2023-12-04 LAB — CBC WITH DIFFERENTIAL/PLATELET
Abs Immature Granulocytes: 0.03 K/uL (ref 0.00–0.07)
Basophils Absolute: 0 K/uL (ref 0.0–0.1)
Basophils Relative: 0 %
Eosinophils Absolute: 0.3 K/uL (ref 0.0–0.5)
Eosinophils Relative: 3 %
HCT: 42.6 % (ref 36.0–46.0)
Hemoglobin: 11.1 g/dL — ABNORMAL LOW (ref 12.0–15.0)
Immature Granulocytes: 0 %
Lymphocytes Relative: 6 %
Lymphs Abs: 0.6 K/uL — ABNORMAL LOW (ref 0.7–4.0)
MCH: 21.6 pg — ABNORMAL LOW (ref 26.0–34.0)
MCHC: 26.1 g/dL — ABNORMAL LOW (ref 30.0–36.0)
MCV: 82.9 fL (ref 80.0–100.0)
Monocytes Absolute: 1.1 K/uL — ABNORMAL HIGH (ref 0.1–1.0)
Monocytes Relative: 10 %
Neutro Abs: 9 K/uL — ABNORMAL HIGH (ref 1.7–7.7)
Neutrophils Relative %: 81 %
Platelets: 292 K/uL (ref 150–400)
RBC: 5.14 MIL/uL — ABNORMAL HIGH (ref 3.87–5.11)
RDW: 21.6 % — ABNORMAL HIGH (ref 11.5–15.5)
WBC: 11.1 K/uL — ABNORMAL HIGH (ref 4.0–10.5)
nRBC: 0 % (ref 0.0–0.2)

## 2023-12-04 LAB — GLUCOSE, CAPILLARY
Glucose-Capillary: 71 mg/dL (ref 70–99)
Glucose-Capillary: 80 mg/dL (ref 70–99)
Glucose-Capillary: 66 mg/dL — ABNORMAL LOW (ref 70–99)
Glucose-Capillary: 90 mg/dL (ref 70–99)
Glucose-Capillary: 106 mg/dL — ABNORMAL HIGH (ref 70–99)
Glucose-Capillary: 137 mg/dL — ABNORMAL HIGH (ref 70–99)

## 2023-12-04 LAB — BASIC METABOLIC PANEL WITH GFR
Anion gap: 12 (ref 5–15)
BUN: 28 mg/dL — ABNORMAL HIGH (ref 8–23)
CO2: 30 mmol/L (ref 22–32)
Calcium: 9.5 mg/dL (ref 8.9–10.3)
Chloride: 102 mmol/L (ref 98–111)
Creatinine, Ser: 2.22 mg/dL — ABNORMAL HIGH (ref 0.44–1.00)
GFR, Estimated: 23 mL/min — ABNORMAL LOW (ref >60)
Glucose, Bld: 72 mg/dL (ref 70–99)
Potassium: 4 mmol/L (ref 3.5–5.1)
Sodium: 145 mmol/L (ref 135–145)

## 2023-12-04 LAB — HEPATIC FUNCTION PANEL
ALT: <5 U/L (ref 0–44)
AST: 15 U/L (ref 15–41)
Albumin: 3.4 g/dL — ABNORMAL LOW (ref 3.5–5.0)
Alkaline Phosphatase: 164 U/L — ABNORMAL HIGH (ref 38–126)
Bilirubin, Direct: 0.1 mg/dL (ref 0.0–0.2)
Indirect Bilirubin: 0.3 mg/dL (ref 0.3–0.9)
Total Bilirubin: 0.4 mg/dL (ref 0.0–1.2)
Total Protein: 6.9 g/dL (ref 6.5–8.1)

## 2023-12-04 MED ORDER — IPRATROPIUM-ALBUTEROL 0.5-2.5 (3) MG/3ML IN SOLN
3.0000 mL | Freq: Three times a day (TID) | RESPIRATORY_TRACT | Status: DC
  Administered 2023-12-04 – 2023-12-09 (×16): 3 mL via RESPIRATORY_TRACT
  Filled 2023-12-04 (×16): qty 3

## 2023-12-04 MED ORDER — DEXTROSE 50 % IV SOLN: 25.0000 mL | Freq: Once | INTRAVENOUS | Status: AC

## 2023-12-04 MED ORDER — METHYLPREDNISOLONE SODIUM SUCC 125 MG IJ SOLR
60.0000 mg | Freq: Two times a day (BID) | INTRAMUSCULAR | Status: DC
  Administered 2023-12-04 – 2023-12-07 (×7): 60 mg via INTRAVENOUS
  Filled 2023-12-04 (×7): qty 2

## 2023-12-04 MED ORDER — HYDRALAZINE HCL 20 MG/ML IJ SOLN
10.0000 mg | Freq: Four times a day (QID) | INTRAMUSCULAR | Status: DC | PRN
  Administered 2023-12-04 – 2023-12-11 (×2): 10 mg via INTRAVENOUS
  Filled 2023-12-04 (×2): qty 1

## 2023-12-04 MED ORDER — DEXTROSE-SODIUM CHLORIDE 5-0.9 % IV SOLN: INTRAVENOUS | Status: DC

## 2023-12-04 MED ORDER — ACETAZOLAMIDE SODIUM 500 MG IJ SOLR
500.0000 mg | Freq: Once | INTRAMUSCULAR | Status: AC
  Administered 2023-12-04: 500 mg via INTRAVENOUS
  Filled 2023-12-04: qty 500

## 2023-12-04 MED ORDER — BUDESONIDE 0.5 MG/2ML IN SUSP
0.5000 mg | Freq: Two times a day (BID) | RESPIRATORY_TRACT | Status: DC
  Administered 2023-12-04 – 2023-12-27 (×45): 0.5 mg via RESPIRATORY_TRACT
  Filled 2023-12-04 (×48): qty 2

## 2023-12-04 MED ORDER — ARFORMOTEROL TARTRATE 15 MCG/2ML IN NEBU
15.0000 ug | INHALATION_SOLUTION | Freq: Two times a day (BID) | RESPIRATORY_TRACT | Status: DC
  Administered 2023-12-04 – 2023-12-27 (×45): 15 ug via RESPIRATORY_TRACT
  Filled 2023-12-04 (×48): qty 2

## 2023-12-04 MED ORDER — METOLAZONE 5 MG PO TABS
5.0000 mg | ORAL_TABLET | Freq: Once | ORAL | Status: AC
  Administered 2023-12-04: 5 mg via ORAL
  Filled 2023-12-04: qty 1

## 2023-12-04 MED ORDER — FLUTICASONE PROPIONATE 50 MCG/ACT NA SUSP
2.0000 | Freq: Every day | NASAL | Status: DC
  Administered 2023-12-04 – 2023-12-19 (×15): 2 via NASAL
  Filled 2023-12-04 (×3): qty 16

## 2023-12-04 MED ORDER — INSULIN GLARGINE 100 UNIT/ML ~~LOC~~ SOLN: 10.0000 [IU] | Freq: Every day | SUBCUTANEOUS | Status: DC

## 2023-12-04 MED ORDER — MAGNESIUM SULFATE 4 GM/100ML IV SOLN
4.0000 g | Freq: Once | INTRAVENOUS | Status: AC
  Administered 2023-12-04: 4 g via INTRAVENOUS
  Filled 2023-12-04: qty 100

## 2023-12-04 NOTE — Progress Notes (Signed)
 PROGRESS NOTE    [name] [name]  FMW:[id number] DOB: Sep 02, 1955 DOA: 11/29/2023 PCP: [facility]    Chief Complaint  Patient presents with   generalied edema     Brief Narrative:  [name] [name] is a 68 y.o. female with medical history significant for insulin -dependent type 2 diabetes, chronic heart failure with preserved EF, morbid obesity, bedbound, hypertension being admitted to the hospital with weight gain and peripheral edema due to suspected acute exacerbation of chronic heart failure with preserved EF.  Cardiology consulted.  Patient also with concerns for left breast cellulitis.   Assessment & Plan:   Principal Problem:   Acute respiratory failure with hypoxia (HCC) Active Problems:   Acute on chronic heart failure with preserved ejection fraction (HFpEF) (HCC)   Insulin  dependent type 2 diabetes mellitus (HCC)   Hyperlipidemia associated with type 2 diabetes mellitus (HCC)   Anxiety and depression   Morbid obesity (HCC)   OSA (obstructive sleep apnea)   Hyperglycemia due to type 2 diabetes mellitus (HCC)   Chronic kidney disease, stage 3b (HCC)   Obesity hypoventilation syndrome (HCC)   Cellulitis of left breast   COPD with acute exacerbation (HCC)  #1 acute on chronic respiratory failure with hypoxia secondary to acute on chronic heart failure with HFpEF/ - Patient presented with a 2-week history of weight gain, worsening lower extremity edema, shortness of breath. - Patient noted to chronically be on 4 L nasal cannula. - Patient noted to have required BiPAP in the ER for comfort with initially some clinical improvement however patient currently on 13 L high flow nasal cannula with sats of 95%.   - Patient endorses compliance with diet and medications prior to admission. - EKG with normal sinus rhythm, low voltage, no ischemic changes noted. - 2D echo from 03/2023 with a EF of 70 to 75%,NWMA, grade 1 DD, right ventricular systolic function severely  reduced.  Severely enlarged right ventricular size. - Repeat 2D echo with EF of 60 to 65%,NWMA, concentric LVH.  Right ventricular systolic function mildly reduced.  Right ventricular size mildly enlarged.  Very limited echo due to poor sound wave transmission.  Tech unable to obtain apical images due to patient discomfort.. - Patient noted to have received Lasix  60 mg IV x 1 in the ED and was on Lasix  40 mg IV every 12 hours and diuretics increased to Lasix  80 mg IV every 12 hours and received a dose of metolazone on 12/02/2023 with a urine output of 1 L. -Patient placed on milrinone as well as Lasix  drip on 12/03/2023 with urine output of 1.360 L over the past 24 hours. -Patient is -2.2 L during this hospitalization - Current weight of 289.46 pounds from 289.46 pounds from 286.82 pounds from 288.36 pounds from 297.4 pounds from 266.98 pounds.  - Patient with no significant diuresis and as such Lasix  dose was increased on 12/03/2023 and patient currently on a Lasix  drip, milrinone.   - PICC line placed.   - Metolazone reordered per cardiology today to augment diuresis.  -Acetazolamide also ordered per cardiology.  - Continue Lipitor. - Patient seen in consultation by cardiology, who feel patient is not a candidate for SGLT2 with obesity, bedbound status.   - cardiology feels patient is RV failure due to sleep apnea, hypoxemia, obesity hypoventilation syndrome and noted to have been on torsemide  20 mg daily at home prior to admission.  -Per cardiology may consider RHC if patient continues to have RV failure. -Cardiologist today feels  patient is not a candidate for AHF therapies and as such for this reason RHC would not add value to her care. -Cardiology feels the only other option may be CVVHD and not sure as to whether patient will be a candidate for this. - Cardiology following and appreciate their input and recommendations.  2.  Acute COPD exacerbation -Due to patient's increased O2 requirements  ABG obtained on 12/03/2023 with a pH of 7.29/pCO2 of 73 plush pO2 of 71/acid base excess of 6.3 with a bicarb of 35.1. - Place patient on Solu-Medrol  60 mg IV every 12 hours, Pulmicort nebs, DuoNebs, Brovana nebs. - Continue Claritin , PPI. -Continue empiric IV antibiotics. - BiPAP nightly and as needed.  3.  OSA with right-sided heart failure in the setting of suspected pulmonary hypertension/obesity hypoventilation syndrome - BiPAP nightly.  4.  CKD stage IIIb -Patient noted during last hospitalization to have creatinine peaked to 6.5 and required pressors as well as midodrine  and gentle IV fluid under supervision of nephrology during her February 2025 hospitalization. - Renal function appears close to baseline and fluctuates between 1.2-2.0. -Patient states no longer on midodrine . - Monitor renal function with diuresis.  5.  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 - CBG 71 this morning. - Lantus  dose already decreased to 25 units daily.  Continue SSI.  -Patient to be started on IV steroids today.  - Patient with some improvement with neuropathic pain after adjustment of Lyrica .   - Continue Lyrica  75 mg 3 times daily.   - Sarna lotion as needed.   6.  Left breast cellulitis versus breast swelling secondary to dependent edema -Patient with concerns over left breast cellulitis with warmth, erythema, tenderness to palpation. - No significant discharge noted. - Ultrasound of left breast done with results pending.  -MRSA PCR positive. -Patient seen in consultation by general surgery who feel patient likely with dependent edema in the setting of CHF and patient predominantly laying more on her left side. -Continue IV Ancef  and linezolid  and treat empirically for 7 days. -Continue IV diuresis as per cardiology. -General Surgery recommending nursing placed several folded towels under left breast to elevated. -Appreciate general surgery input and  recommendations.  7.  History of chronic hypotension -Patient states no longer takes midodrine , blood pressure currently stable will need to monitor with diuresis. - If blood pressure drops and patient still requires diuresis may consider resumption of midodrine .  8.  Hyperlipidemia - Continue Lipitor.   9.  Morbid obesity -BMI 48.05. - Patient noted to be bedbound due to her prior history of lower extremity fracture. - Lifestyle modification. - Follow-up with PCP.  10.  Anxiety/depression - Zoloft.    #11. ??  Asterixis -Asterixis resolved. - Uric acid level within normal limits. -Ammonia level within normal limits. - Slight bump in renal function however close to baseline BUN of 28. - Follow.   DVT prophylaxis: Heparin  Code Status: Full Family Communication: Updated patient.  No family at bedside.  Disposition: Remain in ICU.  Home when clinically improved and cleared by cardiology.   Status is: Inpatient Remains inpatient appropriate because: Severity of illness   Consultants:  Cardiology: Dr. Francyne 11/30/2023 General Surgery: Dr.Tsuei 12/02/2023  Procedures:  2D echo 11/30/2023 Chest x-ray 11/29/2023 Ultrasound left breast 11/30/2023  Antimicrobials:  Anti-infectives (From admission, onward)    Start     Dose/Rate Route Frequency Ordered Stop   12/02/23 1200  linezolid  (ZYVOX ) tablet 600 mg  600 mg Oral Every 12 hours 12/02/23 1104     11/30/23 1200  ceFAZolin  (ANCEF ) IVPB 2g/100 mL premix        2 g 200 mL/hr over 30 Minutes Intravenous Every 8 hours 11/30/23 1049 12/07/23 1359         Subjective: Patient laying in bed.  Just got taken off the BiPAP.  Denies any chest pain.  Still with shortness of breath.  Still with pain in the left breast.  States improvement with neuropathic pain with adjustment of Lyrica .    Objective: Vitals:   12/04/23 0700 12/04/23 0745 12/04/23 0800 12/04/23 0932  BP: 137/61  (!) 144/108   Pulse: (!) 108  (!) 114    Resp: (!) 23  (!) 23   Temp:  99 F (37.2 C)    TempSrc:  Axillary    SpO2: 97%  94% 95%  Weight:      Height:        Intake/Output Summary (Last 24 hours) at 12/04/2023 1009 Last data filed at 12/04/2023 0929 Gross per 24 hour  Intake 657.98 ml  Output 1010 ml  Net -352.02 ml   Filed Weights   12/02/23 0500 12/03/23 0500 12/04/23 0500  Weight: 130.1 kg 131.3 kg 131.3 kg    Examination:  General exam: NAD. Respiratory system: Decreased breath sounds in the bases.  Diffuse crackles.  Minimal expiratory wheezing.  Speaking in full sentences.  No use of accessory muscles of respiration.  Currently on 13 L high flow nasal cannula with FiO2 of 94% with sats of 95%. Cardiovascular system: RRR no murmurs rubs or gallops.  Unable to assess JVD due to body habitus.  2+ bilateral lower extremity edema.  Gastrointestinal system: Abdomen is obese, nondistended, soft, nontender, positive bowel sounds.  No rebound.  No guarding. Central nervous system: Alert and oriented. No focal neurological deficits. Extremities: Asterixis resolved.  Symmetric 5 x 5 power. Skin: Left breast cellulitis with slightly decreased erythema, still with significant tenderness to palpation, some induration around the areola.[name]  Psychiatry: Judgement and insight appear normal. Mood & affect appropriate.     Data Reviewed: I have personally reviewed following labs and imaging studies  CBC: Recent Labs  Lab 11/29/23 1400 11/30/23 0300 12/01/23 0316 12/02/23 0242 12/03/23 0252 12/04/23 0603  WBC 5.8 5.8 6.1 6.6 10.0 11.1*  NEUTROABS 3.2  --  3.4  --  8.0* 9.0*  HGB 12.9 12.4 12.2 12.7 11.0* 11.1*  HCT 48.5* 48.8* 48.1* 50.0* 42.3 42.6  MCV 79.8* 81.5 81.5 82.1 83.6 82.9  PLT 272 376 376 319 280 292    Basic Metabolic Panel: Recent Labs  Lab 11/30/23 0300 12/01/23 0316 12/02/23 0242 12/03/23 0252 12/04/23 0603  NA 145 144 141 144 145  K 4.5 4.2 4.3 4.5 4.0  CL 104 103 100 103 102  CO2 31 31 30  30 30   GLUCOSE 76 104* 132* 62* 72  BUN 29* 27* 26* 27* 28*  CREATININE 1.98* 2.13* 1.99* 2.23* 2.22*  CALCIUM  9.8 9.2 9.5 9.4 9.5  MG  --  1.8 1.8  --  1.7  PHOS  --  4.7*  --   --   --     GFR: Estimated Creatinine Clearance: 31 mL/min (A) (by C-G formula based on SCr of 2.22 mg/dL (H)).  Liver Function Tests: Recent Labs  Lab 11/29/23 1531 12/01/23 0316 12/04/23 0603  AST 15  --  15  ALT 10  --  <5  ALKPHOS 210*  --  164*  BILITOT 0.6  --  0.4  PROT 7.4  --  6.9  ALBUMIN  3.9 3.6 3.4*    CBG: Recent Labs  Lab 12/03/23 1629 12/03/23 1926 12/03/23 2343 12/04/23 0337 12/04/23 0755  GLUCAP 75 78 70 71 80     Recent Results (from the past 240 hours)  MRSA Next Gen by PCR, Nasal     Status: Abnormal   Collection Time: 11/29/23  7:06 PM   Specimen: Nasal Mucosa; Nasal Swab  Result Value Ref Range Status   MRSA by PCR Next Gen DETECTED (A) NOT DETECTED Final    Comment: (NOTE) The GeneXpert MRSA Assay (FDA approved for NASAL specimens only), is one component of a comprehensive MRSA colonization surveillance program. It is not intended to diagnose MRSA infection nor to guide or monitor treatment for MRSA infections. Test performance is not FDA approved in patients less than 7 years old. Performed at [facility], [address] [place], [place] [address]          Radiology Studies: DG CHEST PORT 1 VIEW Result Date: 12/03/2023 CLINICAL DATA:  141880 SOB (shortness of breath) 141880. EXAM: PORTABLE CHEST 1 VIEW COMPARISON:  11/29/2023. FINDINGS: Low lung volume. Redemonstration of moderate diffuse pulmonary vascular congestion with bilateral hilar predominance. Findings appear less pronounced than the prior exam. Bilateral lung fields are otherwise grossly clear. No acute dense consolidation or lung collapse. Bilateral costophrenic angles are clear. Stable moderately enlarged cardio-mediastinal silhouette. No acute osseous abnormalities. The  soft tissues are within normal limits. IMPRESSION: *Findings favor congestive heart failure/pulmonary edema. No acute consolidation or lung collapse. Electronically Signed   By: Ree Molt M.D.   On: 12/03/2023 09:56   US  EKG SITE RITE Result Date: 12/03/2023 If Site Rite image not attached, placement could not be confirmed due to current cardiac rhythm.        Scheduled Meds:  allopurinol   100 mg Oral QHS   arformoterol  15 mcg Nebulization BID   atorvastatin   10 mg Oral QHS   budesonide (PULMICORT) nebulizer solution  0.5 mg Nebulization BID   calcitRIOL  0.25 mcg Oral Q M,W,F   Chlorhexidine  Gluconate Cloth  6 each Topical Daily   fluticasone  2 spray Each Nare Daily   heparin   5,000 Units Subcutaneous Q8H   insulin  aspart  0-20 Units Subcutaneous TID WC   insulin  aspart  0-5 Units Subcutaneous QHS   insulin  glargine  25 Units Subcutaneous Daily   ipratropium-albuterol   3 mL Nebulization TID   linezolid   600 mg Oral Q12H   loratadine   10 mg Oral Daily   methylPREDNISolone  (SOLU-MEDROL ) injection  60 mg Intravenous Q12H   mupirocin  ointment  1 Application Nasal BID   nystatin  cream  1 Application Topical BID   oxyCODONE   10 mg Oral BID   pantoprazole   40 mg Oral QAC breakfast   polyethylene glycol  17 g Oral Daily   potassium chloride  SA  20 mEq Oral Daily   pregabalin   75 mg Oral TID   rOPINIRole  0.25 mg Oral Once   senna-docusate  1 tablet Oral BID   sertraline  50 mg Oral q AM   sodium chloride  flush  10-40 mL Intracatheter Q12H   triamcinolone  1 Application Topical Daily   Continuous Infusions:   ceFAZolin  (ANCEF ) IV Stopped (12/04/23 0612)   furosemide  (LASIX ) 200 mg in dextrose  5 % 100 mL (2 mg/mL) infusion 20 mg/hr (12/04/23 0620)   magnesium  sulfate bolus IVPB  4 g (12/04/23 0837)   milrinone 0.25 mcg/kg/min (12/04/23 0830)     LOS: 5 days    Time spent: 45 minutes    Toribio Hummer, MD [facility]   To contact the attending provider  between 7A-7P or the covering provider during after hours 7P-7A, please log into the web site [email or web address] and access using universal [place] password for that web site. If you do not have the password, please call the hospital operator.  12/04/2023, 10:09 AM

## 2023-12-04 NOTE — Progress Notes (Signed)
   12/04/23 1602  BiPAP/CPAP/SIPAP  BiPAP/CPAP/SIPAP Pt Type Adult  BiPAP/CPAP/SIPAP V60  Mask Type Full face mask  Dentures removed? Not applicable  Mask Size Medium  Set Rate 16 breaths/min  Respiratory Rate 19 breaths/min  IPAP 22 cmH20  EPAP 10 cmH2O  FiO2 (%) 96 %  Minute Ventilation 6.2  Leak 2  Peak Inspiratory Pressure (PIP) 22  Tidal Volume (Vt) 306  Patient Home Machine No  Patient Home Mask No  Patient Home Tubing No  Auto Titrate No  Press High Alarm 30 cmH2O  Press Low Alarm 5 cmH2O  Device Plugged into RED Power Outlet Yes  BiPAP/CPAP /SiPAP Vitals  Pulse Rate (!) 115  Resp 19  BP (!) 198/88  SpO2 95 %  Bilateral Breath Sounds Diminished  MEWS Score/Color  MEWS Score 2  MEWS Score Color Yellow   Patient lethargic and napping at this time. Placed on BiPAP. RN aware. RN notifying MD.

## 2023-12-04 NOTE — Progress Notes (Signed)
   12/04/23 1932  BiPAP/CPAP/SIPAP  BiPAP/CPAP/SIPAP Pt Type Adult  BiPAP/CPAP/SIPAP V60  Mask Type Full face mask  Dentures removed? Not applicable  Mask Size Medium  Set Rate 16 breaths/min  Respiratory Rate 16 breaths/min  IPAP 22 cmH20  EPAP 10 cmH2O  FiO2 (%) 40 %  Minute Ventilation 6.6  Leak 0  Peak Inspiratory Pressure (PIP) 22  Tidal Volume (Vt) 397  Patient Home Machine No  Patient Home Mask No  Patient Home Tubing No  Auto Titrate No  Press High Alarm 30 cmH2O  Press Low Alarm 5 cmH2O  Device Plugged into RED Power Outlet Yes

## 2023-12-04 NOTE — Progress Notes (Signed)
 Subjective: Still with some pain in her left breast.  Breast this morning still tucked under her arm.  RN reports they attempted elevation yesterday some.  US  not read yet, but reviewed and evidence of edema but no obvious abscess  ROS: See above, otherwise other systems negative  Objective: Vital signs in last 24 hours: Temp:  [97.9 F (36.6 C)-98.5 F (36.9 C)] 98.4 F (36.9 C) (10/13 0425) Pulse Rate:  [96-114] 114 (10/13 0800) Resp:  [11-27] 23 (10/13 0800) BP: (116-174)/(45-108) 144/108 (10/13 0800) SpO2:  [89 %-100 %] 94 % (10/13 0800) FiO2 (%):  [50 %] 50 % (10/12 2356) Weight:  [131.3 kg] 131.3 kg (10/13 0500) Last BM Date : 11/29/23  Intake/Output from previous day: 10/12 0701 - 10/13 0700 In: 868 [P.O.:120; I.V.:299.4; IV Piggyback:448.5] Out: 1360 [Urine:1360] Intake/Output this shift: No intake/output data recorded.  PE: Left breast removed from under her arm.  She has some chronic feeling edema and induration of her areola.  No erythema or fluctuance noted to suggest infection.  Lab Results:  Recent Labs    12/03/23 0252 12/04/23 0603  WBC 10.0 11.1*  HGB 11.0* 11.1*  HCT 42.3 42.6  PLT 280 292   BMET Recent Labs    12/03/23 0252 12/04/23 0603  NA 144 145  K 4.5 4.0  CL 103 102  CO2 30 30  GLUCOSE 62* 72  BUN 27* 28*  CREATININE 2.23* 2.22*  CALCIUM  9.4 9.5   PT/INR No results for input(s): LABPROT, INR in the last 72 hours. CMP     Component Value Date/Time   NA 145 12/04/2023 0603   K 4.0 12/04/2023 0603   CL 102 12/04/2023 0603   CO2 30 12/04/2023 0603   GLUCOSE 72 12/04/2023 0603   BUN 28 (H) 12/04/2023 0603   CREATININE 2.22 (H) 12/04/2023 0603   CALCIUM  9.5 12/04/2023 0603   PROT 6.9 12/04/2023 0603   ALBUMIN  3.4 (L) 12/04/2023 0603   AST 15 12/04/2023 0603   ALT <5 12/04/2023 0603   ALKPHOS 164 (H) 12/04/2023 0603   BILITOT 0.4 12/04/2023 0603   GFRNONAA 23 (L) 12/04/2023 0603   GFRAA 44 (L) 11/23/2019 0437    Lipase     Component Value Date/Time   LIPASE 30 02/16/2023 0322       Studies/Results: DG CHEST PORT 1 VIEW Result Date: 12/03/2023 CLINICAL DATA:  141880 SOB (shortness of breath) 141880. EXAM: PORTABLE CHEST 1 VIEW COMPARISON:  11/29/2023. FINDINGS: Low lung volume. Redemonstration of moderate diffuse pulmonary vascular congestion with bilateral hilar predominance. Findings appear less pronounced than the prior exam. Bilateral lung fields are otherwise grossly clear. No acute dense consolidation or lung collapse. Bilateral costophrenic angles are clear. Stable moderately enlarged cardio-mediastinal silhouette. No acute osseous abnormalities. The soft tissues are within normal limits. IMPRESSION: *Findings favor congestive heart failure/pulmonary edema. No acute consolidation or lung collapse. Electronically Signed   By: Ree Molt M.D.   On: 12/03/2023 09:56   US  EKG SITE RITE Result Date: 12/03/2023 If Site Rite image not attached, placement could not be confirmed due to current cardiac rhythm.   Anti-infectives: Anti-infectives (From admission, onward)    Start     Dose/Rate Route Frequency Ordered Stop   12/02/23 1200  linezolid  (ZYVOX ) tablet 600 mg        600 mg Oral Every 12 hours 12/02/23 1104     11/30/23 1200  ceFAZolin  (ANCEF ) IVPB 2g/100 mL premix  2 g 200 mL/hr over 30 Minutes Intravenous Every 8 hours 11/30/23 1049 12/07/23 1359        Assessment/Plan Left breast tenderness secondary to edema, likely secondary to CHF -no evidence of infection -no role for surgical intervention -continue to work to elevate breast and keep it out from under her axilla -we will sign off at this time as there are no indications for surgical intervention.  I reviewed Consultant cardiology notes, hospitalist notes, last 24 h vitals and pain scores, last 48 h intake and output, last 24 h labs and trends, and last 24 h imaging results.   LOS: 5 days    Burnard FORBES Banter , [facility] 12/04/2023, 8:38 AM Please see Amion for pager number during day hours 7:00am-4:30pm or 7:00am -11:30am on weekends

## 2023-12-04 NOTE — Progress Notes (Signed)
 Progress Note  Patient Name: REAGEN GOATES Date of Encounter: 12/04/2023 Primary Cardiologist: Jerel Balding, MD   Subjective   Overnight has diuresed ~1.3 L Patient notes that she has felt lousy the last 17 days.  Is not sure what lead to the worsening symptoms.  Vital Signs    Vitals:   12/04/23 0000 12/04/23 0047 12/04/23 0425 12/04/23 0500  BP: (!) 116/45     Pulse: (!) 107     Resp: 14     Temp:  98.4 F (36.9 C) 98.4 F (36.9 C)   TempSrc:  Axillary Axillary   SpO2: 92%     Weight:    131.3 kg  Height:        Intake/Output Summary (Last 24 hours) at 12/04/2023 0720 Last data filed at 12/04/2023 9379 Gross per 24 hour  Intake 867.98 ml  Output 1360 ml  Net -492.02 ml   Filed Weights   12/02/23 0500 12/03/23 0500 12/04/23 0500  Weight: 130.1 kg 131.3 kg 131.3 kg    Physical Exam   GEN: Morbid obesity, requiring BIPAP Neck: Thick neck Cardiac: Regular tachycardia, no murmurs, rubs, or gallops but distant heart sounds  Respiratory: Decreased breath sounds  GI: Soft, nontender, non-distended  MS: bilateral edema; legs are warm  Labs   Telemetry: sinus tachycardia; rare PVCs; NSVT listed is actually artifact   Chemistry Recent Labs  Lab 11/29/23 1531 11/30/23 0300 12/01/23 0316 12/02/23 0242 12/03/23 0252 12/04/23 0603  NA 143   < > 144 141 144 145  K 4.7   < > 4.2 4.3 4.5 4.0  CL 102   < > 103 100 103 102  CO2 31   < > 31 30 30 30   GLUCOSE 97   < > 104* 132* 62* 72  BUN 29*   < > 27* 26* 27* 28*  CREATININE 1.94*   < > 2.13* 1.99* 2.23* 2.22*  CALCIUM  9.9   < > 9.2 9.5 9.4 9.5  PROT 7.4  --   --   --   --  6.9  ALBUMIN  3.9  --  3.6  --   --  3.4*  AST 15  --   --   --   --  15  ALT 10  --   --   --   --  <5  ALKPHOS 210*  --   --   --   --  164*  BILITOT 0.6  --   --   --   --  0.4  GFRNONAA 27*   < > 24* 26* 23* 23*  ANIONGAP 10   < > 10 11 11 12    < > = values in this interval not displayed.     Hematology Recent Labs  Lab  12/02/23 0242 12/03/23 0252 12/04/23 0603  WBC 6.6 10.0 11.1*  RBC 6.09* 5.06 5.14*  HGB 12.7 11.0* 11.1*  HCT 50.0* 42.3 42.6  MCV 82.1 83.6 82.9  MCH 20.9* 21.7* 21.6*  MCHC 25.4* 26.0* 26.1*  RDW 22.3* 21.8* 21.6*  PLT 319 280 292    BNP Recent Labs  Lab 11/29/23 1531 11/30/23 0300  PROBNP 1,820.0* 1,624.0*     Cardiac Studies   Cardiac Studies & Procedures   ______________________________________________________________________________________________     ECHOCARDIOGRAM  ECHOCARDIOGRAM COMPLETE 11/30/2023  Narrative ECHOCARDIOGRAM REPORT    Patient Name:   SHAMIRACLE GORDEN Date of Exam: 11/30/2023 Medical Rec #:  [id number]  Height:       62.5 in Accession #:    [id number]         Weight:       267.0 lb Date of Birth:  01/24/1956          BSA:          2.174 m Patient Age:    68 years           BP:           135/81 mmHg Patient Gender: F                  HR:           87 bpm. Exam Location:  Inpatient  Procedure: 2D Echo, Cardiac Doppler, Color Doppler and Intracardiac Opacification Agent (Both Spectral and Color Flow Doppler were utilized during procedure).  Indications:    CHF-Acute Diastolic I50.31  History:        Patient has prior history of Echocardiogram examinations, most recent 03/28/2023. Risk Factors:Hypertension and Diabetes.  Sonographer:    Jayson Gaskins Referring Phys: [id number] MIR M [facility]   Sonographer Comments: Technically challenging study due to limited acoustic windows. No apicals due to pain. IMPRESSIONS   1. Left ventricular ejection fraction, by estimation, is 60 to 65%. The left ventricle has normal function. The left ventricle has no regional wall motion abnormalities. There is mild concentric left ventricular hypertrophy. Left ventricular diastolic function could not be evaluated. 2. Right ventricular systolic function is mildly reduced. The right ventricular size is mildly enlarged. 3. The mitral valve is normal  in structure. No evidence of mitral valve regurgitation. No evidence of mitral stenosis. 4. The aortic valve was not well visualized. Aortic valve regurgitation is not visualized. No aortic stenosis is present.  Conclusion(s)/Recommendation(s): Very limited echo due to poor sound wave transmission. Also tech unable to obtain apical images due to patient discomfort. RV not seen well but on available images seems mildly dialted and mildly hypokinetic. Consider repeat echo when patient able to toelrate apical imaging.  FINDINGS Left Ventricle: Left ventricular ejection fraction, by estimation, is 60 to 65%. The left ventricle has normal function. The left ventricle has no regional wall motion abnormalities. The left ventricular internal cavity size was normal in size. There is mild concentric left ventricular hypertrophy. Left ventricular diastolic function could not be evaluated.  Right Ventricle: The right ventricular size is mildly enlarged. No increase in right ventricular wall thickness. Right ventricular systolic function is mildly reduced.  Left Atrium: Left atrial size was normal in size.  Right Atrium: Right atrial size was normal in size.  Pericardium: There is no evidence of pericardial effusion.  Mitral Valve: The mitral valve is normal in structure. No evidence of mitral valve regurgitation. No evidence of mitral valve stenosis.  Tricuspid Valve: The tricuspid valve is not well visualized. Tricuspid valve regurgitation is trivial. No evidence of tricuspid stenosis.  Aortic Valve: The aortic valve was not well visualized. Aortic valve regurgitation is not visualized. No aortic stenosis is present.  Pulmonic Valve: The pulmonic valve was not well visualized. Pulmonic valve regurgitation is trivial. No evidence of pulmonic stenosis.  Aorta: The aortic root is normal in size and structure.  Venous: The inferior vena cava was not well visualized.  IAS/Shunts: No atrial level shunt  detected by color flow Doppler.   LEFT VENTRICLE PLAX 2D LVIDd:         3.70 cm LVIDs:  2.90 cm LV PW:         1.30 cm LV IVS:        1.20 cm   Toribio Fuel MD Electronically signed by Toribio Fuel MD Signature Date/Time: 11/30/2023/12:50:42 PM    Final          ______________________________________________________________________________________________           Assessment & Plan   Acute on chronic Heart failure (right heart failure) - complicated by super morbid obesity (BMI 53) OSA and OHS - CKD stage III b - currently requiring high support with BIPAP for acute hypoxic respiratory failure - on 2.5 mcg Milrinone - on 20 mg IV lasix  drip and metolazone (re-ordered today) for diuretics will augment; still volume up; I have order acetazolamide - High risk of SGLT2i related infection - Prior AKI creatinine peaked at 6.5 (February 2025) - I have reviewed her case with the shock team: CVP elevated though positional, repeat Co-Ox pending; outside of IV diuretic support and possible (if indicated by co-ox) increase in milrinone; she is not a candidate for AHF therapies; for this reason RHC would not add value to her care - my concern is that the only other option would be CVVHD and I am unclear if she is a candidate for this; outside of this she shares with me that she would not want this as her son was on HD despite him living a clean life; at baseline she has not walked in the past 3 years - she will discuss her options with her boyfriend; if no improvement on current diuretic plan consider PC involvement   Left breast cellulitis- as per surgery/primary DM- Insulin  as per primary Chronic Hypotension- may need midodrine  while on milrinone (if we escalate the dose) Morbid obesity- complicated by hypoalbuminemia   CRITICAL CARE Performed by: Fatimata Talsma A Ia Leeb  Total critical care time: 33 minutes. Critical care time was exclusive of separately  billable procedures and treating other patients. Critical care was necessary to treat or prevent imminent or life-threatening deterioration. Critical care was time spent personally by me on the following activities: development of treatment plan with patient as well as nursing, discussions with AHF shock team, evaluation of patient's response to treatment, examination of patient, obtaining history from patient, ordering and performing treatments and interventions, review of  pulse oximetry telemetry.     For questions or updates, please contact [facility] Please consult [email or web address] for contact info under Cardiology/STEMI.      Stanly Leavens, MD FASE [facility] Cardiologist [facility]  [address] [place], [address] [place], [place] [address] [phone]  7:20 AM

## 2023-12-05 ENCOUNTER — Inpatient Hospital Stay (HOSPITAL_COMMUNITY)

## 2023-12-05 ENCOUNTER — Encounter (HOSPITAL_COMMUNITY): Payer: Self-pay | Admitting: Internal Medicine

## 2023-12-05 DIAGNOSIS — J9601 Acute respiratory failure with hypoxia: Secondary | ICD-10-CM | POA: Diagnosis not present

## 2023-12-05 DIAGNOSIS — I50813 Acute on chronic right heart failure: Secondary | ICD-10-CM | POA: Diagnosis not present

## 2023-12-05 DIAGNOSIS — R410 Disorientation, unspecified: Secondary | ICD-10-CM

## 2023-12-05 DIAGNOSIS — N828 Other female genital tract fistulae: Secondary | ICD-10-CM

## 2023-12-05 DIAGNOSIS — N61 Mastitis without abscess: Secondary | ICD-10-CM | POA: Diagnosis not present

## 2023-12-05 DIAGNOSIS — G4733 Obstructive sleep apnea (adult) (pediatric): Secondary | ICD-10-CM | POA: Diagnosis not present

## 2023-12-05 DIAGNOSIS — E1169 Type 2 diabetes mellitus with other specified complication: Secondary | ICD-10-CM | POA: Diagnosis not present

## 2023-12-05 LAB — BLOOD GAS, ARTERIAL
Acid-Base Excess: 8.4 mmol/L — ABNORMAL HIGH (ref 0.0–2.0)
Bicarbonate: 35.1 mmol/L — ABNORMAL HIGH (ref 20.0–28.0)
Delivery systems: POSITIVE
Drawn by: 75061
Expiratory PAP: 10 cmH2O
FIO2: 40 %
Inspiratory PAP: 22 cmH2O
O2 Saturation: 53.8 %
Patient temperature: 36.9
RATE: 16 {breaths}/min
pCO2 arterial: 58 mmHg — ABNORMAL HIGH (ref 32–48)
pH, Arterial: 7.39 (ref 7.35–7.45)
pO2, Arterial: <31 mmHg — CL (ref 83–108)

## 2023-12-05 LAB — CBC WITH DIFFERENTIAL/PLATELET
Abs Immature Granulocytes: 0.04 K/uL (ref 0.00–0.07)
Basophils Absolute: 0 K/uL (ref 0.0–0.1)
Basophils Relative: 0 %
Eosinophils Absolute: 0 K/uL (ref 0.0–0.5)
Eosinophils Relative: 0 %
HCT: 40.3 % (ref 36.0–46.0)
Hemoglobin: 11 g/dL — ABNORMAL LOW (ref 12.0–15.0)
Immature Granulocytes: 0 %
Lymphocytes Relative: 6 %
Lymphs Abs: 0.5 K/uL — ABNORMAL LOW (ref 0.7–4.0)
MCH: 21.8 pg — ABNORMAL LOW (ref 26.0–34.0)
MCHC: 27.3 g/dL — ABNORMAL LOW (ref 30.0–36.0)
MCV: 80 fL (ref 80.0–100.0)
Monocytes Absolute: 0.2 K/uL (ref 0.1–1.0)
Monocytes Relative: 2 %
Neutro Abs: 8.5 K/uL — ABNORMAL HIGH (ref 1.7–7.7)
Neutrophils Relative %: 92 %
Platelets: 311 K/uL (ref 150–400)
RBC: 5.04 MIL/uL (ref 3.87–5.11)
RDW: 21.9 % — ABNORMAL HIGH (ref 11.5–15.5)
Smear Review: NORMAL
WBC: 9.3 K/uL (ref 4.0–10.5)
nRBC: 0 % (ref 0.0–0.2)

## 2023-12-05 LAB — BASIC METABOLIC PANEL WITH GFR
Anion gap: 15 (ref 5–15)
BUN: 33 mg/dL — ABNORMAL HIGH (ref 8–23)
CO2: 29 mmol/L (ref 22–32)
Calcium: 9.7 mg/dL (ref 8.9–10.3)
Chloride: 100 mmol/L (ref 98–111)
Creatinine, Ser: 2.52 mg/dL — ABNORMAL HIGH (ref 0.44–1.00)
GFR, Estimated: 20 mL/min — ABNORMAL LOW (ref >60)
Glucose, Bld: 200 mg/dL — ABNORMAL HIGH (ref 70–99)
Potassium: 4 mmol/L (ref 3.5–5.1)
Sodium: 144 mmol/L (ref 135–145)

## 2023-12-05 LAB — COOXEMETRY PANEL
Carboxyhemoglobin: 0.3 % — ABNORMAL LOW (ref 0.5–1.5)
Methemoglobin: 1.6 % — ABNORMAL HIGH (ref 0.0–1.5)
O2 Saturation: 70.6 %
Total hemoglobin: 11.7 g/dL — ABNORMAL LOW (ref 12.0–16.0)

## 2023-12-05 LAB — GLUCOSE, CAPILLARY
Glucose-Capillary: 179 mg/dL — ABNORMAL HIGH (ref 70–99)
Glucose-Capillary: 176 mg/dL — ABNORMAL HIGH (ref 70–99)
Glucose-Capillary: 194 mg/dL — ABNORMAL HIGH (ref 70–99)
Glucose-Capillary: 217 mg/dL — ABNORMAL HIGH (ref 70–99)

## 2023-12-05 LAB — MAGNESIUM: Magnesium: 2.4 mg/dL (ref 1.7–2.4)

## 2023-12-05 MED ORDER — CEFAZOLIN SODIUM-DEXTROSE 2-4 GM/100ML-% IV SOLN
2.0000 g | Freq: Three times a day (TID) | INTRAVENOUS | Status: DC
  Administered 2023-12-06 (×2): 2 g via INTRAVENOUS
  Filled 2023-12-05 (×2): qty 100

## 2023-12-05 MED ORDER — IOHEXOL 9 MG/ML PO SOLN
500.0000 mL | ORAL | Status: AC
  Administered 2023-12-05 (×2): 500 mL via ORAL

## 2023-12-05 MED ORDER — METOLAZONE 5 MG PO TABS
5.0000 mg | ORAL_TABLET | Freq: Every day | ORAL | Status: DC
  Administered 2023-12-05 – 2023-12-12 (×8): 5 mg via ORAL
  Filled 2023-12-05 (×9): qty 1

## 2023-12-05 MED ORDER — MIDODRINE HCL 5 MG PO TABS: 5.0000 mg | ORAL_TABLET | Freq: Three times a day (TID) | ORAL | Status: DC

## 2023-12-05 MED ORDER — PREGABALIN 75 MG PO CAPS: 75.0000 mg | ORAL_CAPSULE | Freq: Two times a day (BID) | ORAL | Status: DC

## 2023-12-05 MED ORDER — CHLORHEXIDINE GLUCONATE CLOTH 2 % EX PADS
6.0000 | MEDICATED_PAD | Freq: Every day | CUTANEOUS | Status: DC
  Administered 2023-12-06 – 2023-12-26 (×20): 6 via TOPICAL

## 2023-12-05 MED ORDER — PREGABALIN 75 MG PO CAPS
75.0000 mg | ORAL_CAPSULE | Freq: Every day | ORAL | Status: DC
Start: 2023-12-06 — End: 2023-12-17
  Administered 2023-12-06 – 2023-12-17 (×12): 75 mg via ORAL
  Filled 2023-12-05 (×12): qty 1

## 2023-12-05 MED ORDER — MIDODRINE HCL 5 MG PO TABS
5.0000 mg | ORAL_TABLET | Freq: Three times a day (TID) | ORAL | Status: DC
  Administered 2023-12-05 – 2023-12-11 (×13): 5 mg via ORAL
  Filled 2023-12-05 (×15): qty 1

## 2023-12-05 NOTE — Progress Notes (Addendum)
 PROGRESS NOTE    [name]  FMW:[id number] DOB: 1955/04/19 DOA: 11/29/2023 PCP: [facility]    Chief Complaint  Patient presents with   generalied edema     Brief Narrative:  [name] [name] is a 68 y.o. female with medical history significant for insulin -dependent type 2 diabetes, chronic heart failure with preserved EF, morbid obesity, bedbound, hypertension being admitted to the hospital with weight gain and peripheral edema due to suspected acute exacerbation of chronic heart failure with preserved EF.  Cardiology consulted.  Patient also with concerns for left breast cellulitis.  General surgery consulted.  Patient subsequently placed on a Lasix  drip as well as milrinone for diuresis per cardiology and PICC line placed.   Assessment & Plan:   Principal Problem:   Acute respiratory failure with hypoxia (HCC) Active Problems:   Acute on chronic heart failure with preserved ejection fraction (HFpEF) (HCC)   Insulin  dependent type 2 diabetes mellitus (HCC)   Hyperlipidemia associated with type 2 diabetes mellitus (HCC)   Anxiety and depression   Morbid obesity (HCC)   OSA (obstructive sleep apnea)   Hyperglycemia due to type 2 diabetes mellitus (HCC)   Chronic kidney disease, stage 3b (HCC)   Obesity hypoventilation syndrome (HCC)   Cellulitis of left breast   COPD with acute exacerbation (HCC)   Intermittent confusion   Vaginal fistula  #1 acute on chronic respiratory failure with hypoxia secondary to acute on chronic heart failure with HFpEF/ - Patient presented with a 2-week history of weight gain, worsening lower extremity edema, shortness of breath. - Patient noted to chronically be on 4 L nasal cannula. - Patient noted to have required BiPAP in the ER for comfort with initially some clinical improvement however patient currently on 9 L high flow nasal cannula with FiO2 of 40%, with sats of 96%.   - Patient endorses compliance with diet and  medications prior to admission. - EKG with normal sinus rhythm, low voltage, no ischemic changes noted. - 2D echo from 03/2023 with a EF of 70 to 75%,NWMA, grade 1 DD, right ventricular systolic function severely reduced.  Severely enlarged right ventricular size. - Repeat 2D echo with EF of 60 to 65%,NWMA, concentric LVH.  Right ventricular systolic function mildly reduced.  Right ventricular size mildly enlarged.  Very limited echo due to poor sound wave transmission.  Tech unable to obtain apical images due to patient discomfort.. - Patient noted to have received Lasix  60 mg IV x 1 in the ED and was on Lasix  40 mg IV every 12 hours and diuretics increased to Lasix  80 mg IV every 12 hours and received a dose of metolazone on 12/02/2023 with a urine output of 1 L. -Patient placed on milrinone as well as Lasix  drip on 12/03/2023 with urine output of 1.375 L over the past 24 hours. -Patient is -2.7 L during this hospitalization - Current weight of 289.46 pounds (12/04/2023) from 289.46 pounds from 286.82 pounds from 288.36 pounds from 297.4 pounds from 266.98 pounds.  - Patient with no significant diuresis and as such Lasix  dose was increased on 12/03/2023 and patient currently on a Lasix  drip, milrinone.   - PICC line placed.   - Metolazone reordered per cardiology today to augment diuresis.  -Acetazolamide also ordered per cardiology.  - Continue Lipitor. - Patient seen in consultation by cardiology, who feel patient is not a candidate for SGLT2 with obesity, bedbound status.   - cardiology feels patient is RV failure due to  sleep apnea, hypoxemia, obesity hypoventilation syndrome and noted to have been on torsemide  20 mg daily at home prior to admission. - Patient with soft blood pressure and will start midodrine  5 mg 3 times daily to allow for better diuresis. -Per cardiology may consider RHC if patient continues to have RV failure. -Cardiologist feels patient is not a candidate for AHF therapies  and as such for this reason RHC would not add value to her care. -Cardiology feels the only other option may be CVVHD and not sure as to whether patient will be a candidate for this. -Will consult with nephrology also for further evaluation in terms of volume overload and whether patient may be a candidate for CVVHD temporary to allow for diuresis. - Cardiology following and appreciate their input and recommendations.  2.  Acute COPD exacerbation -Due to patient's increased O2 requirements ABG obtained on 12/03/2023 with a pH of 7.29/pCO2 of 73 plush pO2 of 71/acid base excess of 6.3 with a bicarb of 35.1. - Continue Solu-Medrol  60 mg IV every 12 hours and if continued clinical improvement may consider tapering IV Solu-Medrol .   - Continue Pulmicort, Brovana, DuoNebs.  - Continue Claritin , PPI. -Continue empiric IV antibiotics. - BiPAP nightly and as needed.  3.  OSA with right-sided heart failure in the setting of suspected pulmonary hypertension/obesity hypoventilation syndrome - BiPAP nightly.  4.  AKI on CKD stage IIIb -Patient noted during last hospitalization to have creatinine peaked to 6.5 and required pressors as well as midodrine  and gentle IV fluid under supervision of nephrology during her February 2025 hospitalization. - Renal function appears close to baseline and fluctuates between 1.2-2.0. -Renal function slowly starting to creep up with creatinine currently at 2.52. -Patient states no longer on midodrine . - Monitor renal function with diuresis. - Due to increasing renal function, soft blood pressure, volume overload will consult with nephrology for further evaluation and management.  5.  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 - CBG noted at 179 this morning.   - Due to low CBGs the afternoon of 12/04/2023 patient's Lantus  has been discontinued.  -Continue SSI.   -Patient started on IV steroids. - Patient with some improvement with  neuropathic pain after adjustment of Lyrica .   -Discontinue D5 normal saline. -If CBGs start to trend up we will consider resuming Lantus  at a much lower dose of 10 units daily and uptitrating as needed for better blood glucose control. - Decrease Lyrica  to 75 mg twice daily secondary to renal function and concerns for some intermittent confusion.  - Sarna lotion as needed.   6.  Left breast cellulitis versus breast swelling secondary to dependent edema -Patient with concerns over left breast cellulitis with warmth, erythema, tenderness to palpation. - No significant discharge noted. - Ultrasound of left breast done with results pending.  -MRSA PCR positive. -Patient seen in consultation by general surgery who feel patient likely with dependent edema in the setting of CHF and patient predominantly laying more on her left side. -Per general surgery noted to have reviewed ultrasound and felt no noted abscess. -Continue IV Ancef  and linezolid  and treat empirically for 7 days. -Continue IV diuresis as per cardiology. -General Surgery recommending nursing placed several folded towels under left breast to elevated. -Appreciate general surgery input and recommendations.  7.  History of chronic hypotension -Patient states no longer takes midodrine , blood pressure initially was stable however blood pressure is soft with diuresis on Lasix  drip.   -Will start midodrine  5  mg 3 times daily to aid with diuresis.  8.  Hyperlipidemia - Continue Lipitor.   9.  Morbid obesity -BMI 48.05. - Patient noted to be bedbound due to her prior history of lower extremity fracture. - Lifestyle modification. - Follow-up with PCP.  10.  Anxiety/depression - Continue Zoloft.    #11. ??  Asterixis -Asterixis resolved. - Uric acid level within normal limits. -Ammonia level within normal limits. - Slight bump in renal function however close to baseline BUN of 28. - Follow.  12.  Intermittent confusion/??   Sundowning - Patient with some intermittent confusion, noted to be somewhat repetitive, confusion has been recurrent over the past 48 hours in the evenings and improves throughout the day. - Patient being treated for probable acute COPD exacerbation on BiPAP as needed and nightly. - Ammonia levels within normal limits. - Will decrease Lyrica  to twice daily. - Follow.  13.???  Vaginal fistula - Per RN patient with stool noted coming out of the vagina. - Patient also with complaints of stool coming out of her vagina stated similar thing occurred about a year ago. - Check a CT abdomen and pelvis for further evaluation.   DVT prophylaxis: Heparin  Code Status: Full Family Communication: Updated patient.  No family at bedside.  Tried calling son on the telephone but went to voicemail. Disposition: Remain in ICU.  Home when clinically improved and cleared by cardiology.   Status is: Inpatient Remains inpatient appropriate because: Severity of illness   Consultants:  Cardiology: Dr. Francyne 11/30/2023 General Surgery: Dr.Tsuei 12/02/2023  Procedures:  2D echo 11/30/2023 Chest x-ray 11/29/2023 Ultrasound left breast 11/30/2023  Antimicrobials:  Anti-infectives (From admission, onward)    Start     Dose/Rate Route Frequency Ordered Stop   12/02/23 1200  linezolid  (ZYVOX ) tablet 600 mg        600 mg Oral Every 12 hours 12/02/23 1104 12/08/23 2359   11/30/23 1200  ceFAZolin  (ANCEF ) IVPB 2g/100 mL premix        2 g 200 mL/hr over 30 Minutes Intravenous Every 8 hours 11/30/23 1049 12/07/23 1359         Subjective: Patient laying in bed alert oriented.  Noted to have some confusion overnight which seems to have improved this morning.  Patient however somewhat repetitive.  Patient noted over the past 2 days to have some intermittent confusion in the evenings.  Patient also with improvement with neuropathy after adjustment of Lyrica .  Patient noted to be on BiPAP overnight currently off  BiPAP and on 9 L high flow nasal cannula.  Patient states a little bit of improvement with shortness of breath.  Denies any chest pain.  No abdominal pain.  Patient with complaints of stool coming out of her vagina.  Stated occurred about a year ago as well, at which time her boyfriend mention to her.   Objective: Vitals:   12/05/23 0826 12/05/23 0838 12/05/23 0925 12/05/23 0927  BP:      Pulse:  (!) 108 (!) 116 (!) 116  Resp:  14 (!) 25 19  Temp:      TempSrc:      SpO2: 91% 96% 95% 96%  Weight:      Height:        Intake/Output Summary (Last 24 hours) at 12/05/2023 1010 Last data filed at 12/05/2023 0940 Gross per 24 hour  Intake 681.09 ml  Output 1375 ml  Net -693.91 ml   Filed Weights   12/02/23 0500 12/03/23 0500 12/04/23 0500  Weight: 130.1 kg 131.3 kg 131.3 kg    Examination:  General exam: NAD. Respiratory system: Decreased breath sounds in the bases.  Scattered diffuse crackles.  No significant wheezing noted.  Speaking in full sentences.  No use of accessory muscles of respiration.  Was on BiPAP overnight currently on 9 L high flow nasal cannula with a FiO2 of 40% and sats of 96%.  Cardiovascular system: Tachycardia.  No murmurs rubs or gallops.  Unable to assess JVD due to body habitus.  2+ bilateral lower extremity edema.  Gastrointestinal system: Abdomen is obese, soft, nontender, nondistended, positive bowel sounds.  No rebound.  No guarding. Central nervous system: Alert and oriented. No focal neurological deficits. Extremities: Asterixis resolved.  Symmetric 5 x 5 power. Skin: Left breast cellulitis with decreased erythema, decreased tenderness to palpation, decreasing induration around areola.  Psychiatry: Judgement and insight appear normal. Mood & affect appropriate.     Data Reviewed: I have personally reviewed following labs and imaging studies  CBC: Recent Labs  Lab 11/29/23 1400 11/30/23 0300 12/01/23 0316 12/02/23 0242 12/03/23 0252  12/04/23 0603 12/05/23 0809  WBC 5.8   < > 6.1 6.6 10.0 11.1* 9.3  NEUTROABS 3.2  --  3.4  --  8.0* 9.0* 8.5*  HGB 12.9   < > 12.2 12.7 11.0* 11.1* 11.0*  HCT 48.5*   < > 48.1* 50.0* 42.3 42.6 40.3  MCV 79.8*   < > 81.5 82.1 83.6 82.9 80.0  PLT 272   < > 376 319 280 292 311   < > = values in this interval not displayed.    Basic Metabolic Panel: Recent Labs  Lab 12/01/23 0316 12/02/23 0242 12/03/23 0252 12/04/23 0603 12/05/23 0809  NA 144 141 144 145 144  K 4.2 4.3 4.5 4.0 4.0  CL 103 100 103 102 100  CO2 31 30 30 30 29   GLUCOSE 104* 132* 62* 72 200*  BUN 27* 26* 27* 28* 33*  CREATININE 2.13* 1.99* 2.23* 2.22* 2.52*  CALCIUM  9.2 9.5 9.4 9.5 9.7  MG 1.8 1.8  --  1.7 2.4  PHOS 4.7*  --   --   --   --     GFR: Estimated Creatinine Clearance: 27.3 mL/min (A) (by C-G formula based on SCr of 2.52 mg/dL (H)).  Liver Function Tests: Recent Labs  Lab 11/29/23 1531 12/01/23 0316 12/04/23 0603  AST 15  --  15  ALT 10  --  <5  ALKPHOS 210*  --  164*  BILITOT 0.6  --  0.4  PROT 7.4  --  6.9  ALBUMIN  3.9 3.6 3.4*    CBG: Recent Labs  Lab 12/04/23 1205 12/04/23 1409 12/04/23 1636 12/04/23 2116 12/05/23 0746  GLUCAP 66* 90 106* 137* 179*     Recent Results (from the past 240 hours)  MRSA Next Gen by PCR, Nasal     Status: Abnormal   Collection Time: 11/29/23  7:06 PM   Specimen: Nasal Mucosa; Nasal Swab  Result Value Ref Range Status   MRSA by PCR Next Gen DETECTED (A) NOT DETECTED Final    Comment: (NOTE) The GeneXpert MRSA Assay (FDA approved for NASAL specimens only), is one component of a comprehensive MRSA colonization surveillance program. It is not intended to diagnose MRSA infection nor to guide or monitor treatment for MRSA infections. Test performance is not FDA approved in patients less than 35 years old. Performed at [facility], [address], [place], [place] [address]  Radiology Studies: No results  found.        Scheduled Meds:  allopurinol   100 mg Oral QHS   arformoterol  15 mcg Nebulization BID   atorvastatin   10 mg Oral QHS   budesonide (PULMICORT) nebulizer solution  0.5 mg Nebulization BID   calcitRIOL  0.25 mcg Oral Q M,W,F   fluticasone  2 spray Each Nare Daily   heparin   5,000 Units Subcutaneous Q8H   insulin  aspart  0-20 Units Subcutaneous TID WC   insulin  aspart  0-5 Units Subcutaneous QHS   ipratropium-albuterol   3 mL Nebulization TID   linezolid   600 mg Oral Q12H   loratadine   10 mg Oral Daily   methylPREDNISolone  (SOLU-MEDROL ) injection  60 mg Intravenous Q12H   midodrine   5 mg Oral TID WC   nystatin  cream  1 Application Topical BID   oxyCODONE   10 mg Oral BID   pantoprazole   40 mg Oral QAC breakfast   polyethylene glycol  17 g Oral Daily   potassium chloride  SA  20 mEq Oral Daily   pregabalin   75 mg Oral BID   rOPINIRole  0.25 mg Oral Once   senna-docusate  1 tablet Oral BID   sertraline  50 mg Oral q AM   sodium chloride  flush  10-40 mL Intracatheter Q12H   triamcinolone  1 Application Topical Daily   Continuous Infusions:   ceFAZolin  (ANCEF ) IV 2 g (12/05/23 0938)   dextrose  5 % and 0.9 % NaCl 20 mL/hr at 12/05/23 0128   furosemide  (LASIX ) 200 mg in dextrose  5 % 100 mL (2 mg/mL) infusion 20 mg/hr (12/05/23 0128)   milrinone 0.25 mcg/kg/min (12/05/23 0128)     LOS: 6 days    Time spent: 45 minutes    Toribio Hummer, MD [facility]   To contact the attending provider between 7A-7P or the covering provider during after hours 7P-7A, please log into the web site [email or web address] and access using universal [place] password for that web site. If you do not have the password, please call the hospital operator.  12/05/2023, 10:10 AM

## 2023-12-05 NOTE — Inpatient Diabetes Management (Signed)
 Inpatient Diabetes Program Recommendations  AACE/ADA: New Consensus Statement on Inpatient Glycemic Control (2015)  Target Ranges:  Prepandial:   less than 140 mg/dL      Peak postprandial:   less than 180 mg/dL (1-2 hours)      Critically ill patients:  140 - 180 mg/dL   Lab Results  Component Value Date   GLUCAP 179 (H) 12/05/2023   HGBA1C 8.8 (H) 11/29/2023    Review of Glycemic Control  Diabetes history: DM2 Outpatient Diabetes medications: Amaryl 4 BID, metformin 1000 mg BID Current orders for Inpatient glycemic control: Novolog  0-20 TID with meals and 0-5 HS  HgbA1C - 8.8% Lantus  discontinued  Inpatient Diabetes Program Recommendations:    Consider decreasing Novolog  to 0-15 TID with meals and 0-5 HS to avoid hypoglycemia.  Continue to follow.  Thank you. Shona Brandy, RD, LDN, CDCES Inpatient Diabetes Coordinator [phone]
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
## 2023-12-05 NOTE — Progress Notes (Addendum)
 Progress Note  Patient Name: [name] [name] Date of Encounter: 12/05/2023 Primary Cardiologist: Jerel Balding, MD   Subjective   Overnight has diuresed ~1.3 L again.  Creatinine has increased. Venous Co-ox of 71. Mentation is worse.  Vital Signs    Vitals:   12/05/23 0826 12/05/23 0838 12/05/23 0925 12/05/23 0927  BP:      Pulse:  (!) 108 (!) 116 (!) 116  Resp:  14 (!) 25 19  Temp:      TempSrc:      SpO2: 91% 96% 95% 96%  Weight:      Height:        Intake/Output Summary (Last 24 hours) at 12/05/2023 1020 Last data filed at 12/05/2023 0940 Gross per 24 hour  Intake 681.09 ml  Output 1375 ml  Net -693.91 ml   Filed Weights   12/02/23 0500 12/03/23 0500 12/04/23 0500  Weight: 130.1 kg 131.3 kg 131.3 kg    Physical Exam   GEN: Morbid obesity, requirinng 10 L 02; she is having paroxsyms of silent confusion with asterixis, this partially resolves Neck: Thick neck Cardiac: Regular tachycardia, no murmurs, rubs, or gallops but distant heart sounds  Respiratory: Decreased breath sounds  GI: Soft, nontender, non-distended  MS: bilateral edema; legs are warm   Labs   Telemetry: sinus tachycardia   Chemistry Recent Labs  Lab 11/29/23 1531 11/30/23 0300 12/01/23 0316 12/02/23 0242 12/03/23 0252 12/04/23 0603 12/05/23 0809  NA 143   < > 144   < > 144 145 144  K 4.7   < > 4.2   < > 4.5 4.0 4.0  CL 102   < > 103   < > 103 102 100  CO2 31   < > 31   < > 30 30 29   GLUCOSE 97   < > 104*   < > 62* 72 200*  BUN 29*   < > 27*   < > 27* 28* 33*  CREATININE 1.94*   < > 2.13*   < > 2.23* 2.22* 2.52*  CALCIUM  9.9   < > 9.2   < > 9.4 9.5 9.7  PROT 7.4  --   --   --   --  6.9  --   ALBUMIN  3.9  --  3.6  --   --  3.4*  --   AST 15  --   --   --   --  15  --   ALT 10  --   --   --   --  <5  --   ALKPHOS 210*  --   --   --   --  164*  --   BILITOT 0.6  --   --   --   --  0.4  --   GFRNONAA 27*   < > 24*   < > 23* 23* 20*  ANIONGAP 10   < > 10   < > 11 12 15     < > = values in this interval not displayed.     Hematology Recent Labs  Lab 12/03/23 0252 12/04/23 0603 12/05/23 0809  WBC 10.0 11.1* 9.3  RBC 5.06 5.14* 5.04  HGB 11.0* 11.1* 11.0*  HCT 42.3 42.6 40.3  MCV 83.6 82.9 80.0  MCH 21.7* 21.6* 21.8*  MCHC 26.0* 26.1* 27.3*  RDW 21.8* 21.6* 21.9*  PLT 280 292 311    BNP Recent Labs  Lab 11/29/23 1531 11/30/23 0300  PROBNP 1,820.0* 1,624.0*  Cardiac Studies   Cardiac Studies & Procedures   ______________________________________________________________________________________________     ECHOCARDIOGRAM  ECHOCARDIOGRAM COMPLETE 11/30/2023  Narrative ECHOCARDIOGRAM REPORT    Patient Name:   [name] [name] Date of Exam: 11/30/2023 Medical Rec #:  [id number]          Height:       62.5 in Accession #:    [id number]         Weight:       267.0 lb Date of Birth:  December 18, 1955          BSA:          2.174 m Patient Age:    68 years           BP:           135/81 mmHg Patient Gender: F                  HR:           87 bpm. Exam Location:  Inpatient  Procedure: 2D Echo, Cardiac Doppler, Color Doppler and Intracardiac Opacification Agent (Both Spectral and Color Flow Doppler were utilized during procedure).  Indications:    CHF-Acute Diastolic I50.31  History:        Patient has prior history of Echocardiogram examinations, most recent 03/28/2023. Risk Factors:Hypertension and Diabetes.  Sonographer:    Jayson Gaskins Referring Phys: [id number] MIR M [facility]   Sonographer Comments: Technically challenging study due to limited acoustic windows. No apicals due to pain. IMPRESSIONS   1. Left ventricular ejection fraction, by estimation, is 60 to 65%. The left ventricle has normal function. The left ventricle has no regional wall motion abnormalities. There is mild concentric left ventricular hypertrophy. Left ventricular diastolic function could not be evaluated. 2. Right ventricular systolic function is mildly  reduced. The right ventricular size is mildly enlarged. 3. The mitral valve is normal in structure. No evidence of mitral valve regurgitation. No evidence of mitral stenosis. 4. The aortic valve was not well visualized. Aortic valve regurgitation is not visualized. No aortic stenosis is present.  Conclusion(s)/Recommendation(s): Very limited echo due to poor sound wave transmission. Also tech unable to obtain apical images due to patient discomfort. RV not seen well but on available images seems mildly dialted and mildly hypokinetic. Consider repeat echo when patient able to toelrate apical imaging.  FINDINGS Left Ventricle: Left ventricular ejection fraction, by estimation, is 60 to 65%. The left ventricle has normal function. The left ventricle has no regional wall motion abnormalities. The left ventricular internal cavity size was normal in size. There is mild concentric left ventricular hypertrophy. Left ventricular diastolic function could not be evaluated.  Right Ventricle: The right ventricular size is mildly enlarged. No increase in right ventricular wall thickness. Right ventricular systolic function is mildly reduced.  Left Atrium: Left atrial size was normal in size.  Right Atrium: Right atrial size was normal in size.  Pericardium: There is no evidence of pericardial effusion.  Mitral Valve: The mitral valve is normal in structure. No evidence of mitral valve regurgitation. No evidence of mitral valve stenosis.  Tricuspid Valve: The tricuspid valve is not well visualized. Tricuspid valve regurgitation is trivial. No evidence of tricuspid stenosis.  Aortic Valve: The aortic valve was not well visualized. Aortic valve regurgitation is not visualized. No aortic stenosis is present.  Pulmonic Valve: The pulmonic valve was not well visualized. Pulmonic valve regurgitation is trivial. No evidence of pulmonic stenosis.  Aorta: The aortic root is  normal in size and  structure.  Venous: The inferior vena cava was not well visualized.  IAS/Shunts: No atrial level shunt detected by color flow Doppler.   LEFT VENTRICLE PLAX 2D LVIDd:         3.70 cm LVIDs:         2.90 cm LV PW:         1.30 cm LV IVS:        1.20 cm   Toribio Fuel MD Electronically signed by Toribio Fuel MD Signature Date/Time: 11/30/2023/12:50:42 PM    Final          ______________________________________________________________________________________________           Assessment & Plan   Acute on chronic Heart failure (right heart failure) - complicated by super morbid obesity (BMI 53) OSA and OHS - CKD stage III b - currently requiring high support with BIPAP for acute hypoxic respiratory failure - on 2.5 mcg Milrinone with venous co-ox of 71 - on 20 mg IV lasix  drip and metolazone  (5 mg daily) for diuretics will augment; still volume up; despite this and diamox she is still not euvolemic - High risk of SGLT2i related infection - Prior AKI creatinine peaked at 6.5 (February 2025), creatinine has increased today - discussed with critical care nursing and TRH at length: clinically and by CVP hypervolemic and RV dysfunction is the etiology; given mixed picture PH not a candidate for PAH therapies; I do not believe RHC would add to her care unless it appears that she is actually hypovolemic and hypoxic respiratory failure is non-cardiac - discussed with shock team 12/04/23 - nephrology will eval today; if not a candidate for CVVHD consider CP involvement - tachycardia is sinus not arrhythmia   Left breast cellulitis- as per surgery/primary DM- Insulin  as per primary Chronic Hypotension- may need midodrine  while on milrinone (if we escalate the dose) Morbid obesity- complicated by hypoalbuminemia; has not eaten in two days due to BIPAP then confusion  High risk patient  CRITICAL CARE Performed by: Jaionna Weisse A Cederic Mozley  Total critical care time:  40 minutes. Critical care time was exclusive of separately billable procedures and treating other patients. Critical care was necessary to treat or prevent imminent or life-threatening deterioration. Critical care was time spent personally by me on the following activities: development of treatment plan with patient as well as nursing, discussions with TRH, evaluation of patient's response to treatment, examination of patient, ordering and performing treatments and interventions, ordering and review of laboratory studies, ordering and review of radiographic studies, pulse oximetry and re-evaluation of patient's condition.    Signed, Stanly Leavens, MD FASE [facility]  [facility]  12/05/2023 11:00 AM       For questions or updates, please contact [facility] Please consult [email or web address] for contact info under Cardiology/STEMI.      Stanly Leavens, MD FASE [facility] Cardiologist [facility]  [address] [place], [address] [place], [place] [address] [phone]  10:20 AM  Called by CC nursing- Pump not working since our evaluation (no alarm, error) Despite this she has put out 900 cc.  Continue lasix  and milrinone with hope that without pump error her output increases.  Stanly Leavens, MD FASE [facility] Cardiologist [facility]  [address] [place], [place] [address] [phone]  2:20 PM

## 2023-12-05 NOTE — Progress Notes (Signed)
   12/05/23 1938  BiPAP/CPAP/SIPAP  BiPAP/CPAP/SIPAP Pt Type Adult  BiPAP/CPAP/SIPAP V60  Mask Type Full face mask  Dentures removed? Not applicable  Mask Size Medium  Set Rate 16 breaths/min  Respiratory Rate 21 breaths/min  IPAP 22 cmH20  EPAP 10 cmH2O  FiO2 (%) 40 %  Minute Ventilation 6.4  Leak 0  Peak Inspiratory Pressure (PIP) 23  Tidal Volume (Vt) 347  Patient Home Machine No  Patient Home Mask No  Patient Home Tubing No  Auto Titrate No  Press High Alarm 30 cmH2O  Press Low Alarm 5 cmH2O  Device Plugged into RED Power Outlet Yes  BiPAP/CPAP /SiPAP Vitals  Bilateral Breath Sounds Diminished

## 2023-12-06 DIAGNOSIS — J9601 Acute respiratory failure with hypoxia: Secondary | ICD-10-CM | POA: Diagnosis not present

## 2023-12-06 DIAGNOSIS — I50813 Acute on chronic right heart failure: Secondary | ICD-10-CM | POA: Diagnosis not present

## 2023-12-06 LAB — BASIC METABOLIC PANEL WITH GFR
Anion gap: 15 (ref 5–15)
BUN: 35 mg/dL — ABNORMAL HIGH (ref 8–23)
CO2: 28 mmol/L (ref 22–32)
Calcium: 9 mg/dL (ref 8.9–10.3)
Chloride: 96 mmol/L — ABNORMAL LOW (ref 98–111)
Creatinine, Ser: 2.38 mg/dL — ABNORMAL HIGH (ref 0.44–1.00)
GFR, Estimated: 21 mL/min — ABNORMAL LOW (ref >60)
Glucose, Bld: 499 mg/dL — ABNORMAL HIGH (ref 70–99)
Potassium: 3.4 mmol/L — ABNORMAL LOW (ref 3.5–5.1)
Sodium: 138 mmol/L (ref 135–145)

## 2023-12-06 LAB — CBC WITH DIFFERENTIAL/PLATELET
Abs Immature Granulocytes: 0.04 K/uL (ref 0.00–0.07)
Basophils Absolute: 0 K/uL (ref 0.0–0.1)
Basophils Relative: 0 %
Eosinophils Absolute: 0 K/uL (ref 0.0–0.5)
Eosinophils Relative: 0 %
HCT: 39.4 % (ref 36.0–46.0)
Hemoglobin: 10.4 g/dL — ABNORMAL LOW (ref 12.0–15.0)
Immature Granulocytes: 1 %
Lymphocytes Relative: 5 %
Lymphs Abs: 0.4 K/uL — ABNORMAL LOW (ref 0.7–4.0)
MCH: 21.1 pg — ABNORMAL LOW (ref 26.0–34.0)
MCHC: 26.4 g/dL — ABNORMAL LOW (ref 30.0–36.0)
MCV: 80.1 fL (ref 80.0–100.0)
Monocytes Absolute: 0.3 K/uL (ref 0.1–1.0)
Monocytes Relative: 4 %
Neutro Abs: 7.1 K/uL (ref 1.7–7.7)
Neutrophils Relative %: 90 %
Platelets: 277 K/uL (ref 150–400)
RBC: 4.92 MIL/uL (ref 3.87–5.11)
RDW: 21.9 % — ABNORMAL HIGH (ref 11.5–15.5)
Smear Review: NORMAL
WBC: 7.9 K/uL (ref 4.0–10.5)
nRBC: 0 % (ref 0.0–0.2)

## 2023-12-06 LAB — GLUCOSE, CAPILLARY
Glucose-Capillary: 229 mg/dL — ABNORMAL HIGH (ref 70–99)
Glucose-Capillary: 242 mg/dL — ABNORMAL HIGH (ref 70–99)
Glucose-Capillary: 220 mg/dL — ABNORMAL HIGH (ref 70–99)
Glucose-Capillary: 205 mg/dL — ABNORMAL HIGH (ref 70–99)

## 2023-12-06 LAB — COOXEMETRY PANEL
Carboxyhemoglobin: 1.8 % — ABNORMAL HIGH (ref 0.5–1.5)
Methemoglobin: 0.8 % (ref 0.0–1.5)
O2 Saturation: 82 %
Total hemoglobin: 11.5 g/dL — ABNORMAL LOW (ref 12.0–16.0)

## 2023-12-06 LAB — MAGNESIUM: Magnesium: 2 mg/dL (ref 1.7–2.4)

## 2023-12-06 MED ORDER — INSULIN GLARGINE-YFGN 100 UNIT/ML ~~LOC~~ SOLN
10.0000 [IU] | Freq: Every day | SUBCUTANEOUS | Status: DC
  Administered 2023-12-06 – 2023-12-07 (×2): 10 [IU] via SUBCUTANEOUS
  Filled 2023-12-06 (×2): qty 0.1

## 2023-12-06 MED ORDER — CEFAZOLIN SODIUM-DEXTROSE 2-4 GM/100ML-% IV SOLN
2.0000 g | Freq: Two times a day (BID) | INTRAVENOUS | Status: AC
  Administered 2023-12-06 – 2023-12-07 (×2): 2 g via INTRAVENOUS
  Filled 2023-12-06 (×2): qty 100

## 2023-12-06 NOTE — Progress Notes (Signed)
 Progress Note  Patient Name: [name] [name] Date of Encounter: 12/06/2023 Primary Cardiologist: Jerel Balding, MD   Subjective   Overnight has put out more volume.  Mentation has greatly improved.  Down to 6 L.  Vital Signs    Vitals:   12/06/23 0700 12/06/23 0800 12/06/23 0900 12/06/23 1000  BP: (!) 121/46 (!) 166/69 (!) 174/73 (!) 104/34  Pulse: (!) 105 (!) 102 (!) 115 (!) 109  Resp: 12 17 18 18   Temp:  97.9 F (36.6 C)    TempSrc:  Oral    SpO2: 97% 97% 96% 92%  Weight:      Height:        Intake/Output Summary (Last 24 hours) at 12/06/2023 1038 Last data filed at 12/06/2023 0654 Gross per 24 hour  Intake 1362.33 ml  Output 1750 ml  Net -387.67 ml   Filed Weights   12/03/23 0500 12/04/23 0500 12/06/23 0500  Weight: 131.3 kg 131.3 kg 125.4 kg    Physical Exam   GEN: Morbid obesity, improved asterixis Neck: Thick neck Cardiac: Regular tachycardia, no murmurs, rubs, or gallops but distant heart sounds  Respiratory: Decreased breath sounds  GI: Soft, nontender, non-distended  MS: bilateral edema; legs are warm   Labs   Telemetry: sinus tachycardia   Chemistry Recent Labs  Lab 11/29/23 1531 11/30/23 0300 12/01/23 0316 12/02/23 0242 12/04/23 0603 12/05/23 0809 12/06/23 0500  NA 143   < > 144   < > 145 144 138  K 4.7   < > 4.2   < > 4.0 4.0 3.4*  CL 102   < > 103   < > 102 100 96*  CO2 31   < > 31   < > 30 29 28   GLUCOSE 97   < > 104*   < > 72 200* 499*  BUN 29*   < > 27*   < > 28* 33* 35*  CREATININE 1.94*   < > 2.13*   < > 2.22* 2.52* 2.38*  CALCIUM  9.9   < > 9.2   < > 9.5 9.7 9.0  PROT 7.4  --   --   --  6.9  --   --   ALBUMIN  3.9  --  3.6  --  3.4*  --   --   AST 15  --   --   --  15  --   --   ALT 10  --   --   --  <5  --   --   ALKPHOS 210*  --   --   --  164*  --   --   BILITOT 0.6  --   --   --  0.4  --   --   GFRNONAA 27*   < > 24*   < > 23* 20* 21*  ANIONGAP 10   < > 10   < > 12 15 15    < > = values in this interval not  displayed.     Hematology Recent Labs  Lab 12/04/23 0603 12/05/23 0809 12/06/23 0500  WBC 11.1* 9.3 7.9  RBC 5.14* 5.04 4.92  HGB 11.1* 11.0* 10.4*  HCT 42.6 40.3 39.4  MCV 82.9 80.0 80.1  MCH 21.6* 21.8* 21.1*  MCHC 26.1* 27.3* 26.4*  RDW 21.6* 21.9* 21.9*  PLT 292 311 277    BNP Recent Labs  Lab 11/29/23 1531 11/30/23 0300  PROBNP 1,820.0* 1,624.0*     Cardiac Studies   Cardiac  Studies & Procedures   ______________________________________________________________________________________________     ECHOCARDIOGRAM  ECHOCARDIOGRAM COMPLETE 11/30/2023  Narrative ECHOCARDIOGRAM REPORT    Patient Name:   [name] [name] Date of Exam: 11/30/2023 Medical Rec #:  [id number]          Height:       62.5 in Accession #:    [id number]         Weight:       267.0 lb Date of Birth:  [birth year]          BSA:          2.174 m Patient Age:    68 years           BP:           135/81 mmHg Patient Gender: F                  HR:           87 bpm. Exam Location:  Inpatient  Procedure: 2D Echo, Cardiac Doppler, Color Doppler and Intracardiac Opacification Agent (Both Spectral and Color Flow Doppler were utilized during procedure).  Indications:    CHF-Acute Diastolic I50.31  History:        Patient has prior history of Echocardiogram examinations, most recent 03/28/2023. Risk Factors:Hypertension and Diabetes.  Sonographer:    Jayson Gaskins Referring Phys: [id number] MIR M [facility]   Sonographer Comments: Technically challenging study due to limited acoustic windows. No apicals due to pain. IMPRESSIONS   1. Left ventricular ejection fraction, by estimation, is 60 to 65%. The left ventricle has normal function. The left ventricle has no regional wall motion abnormalities. There is mild concentric left ventricular hypertrophy. Left ventricular diastolic function could not be evaluated. 2. Right ventricular systolic function is mildly reduced. The right ventricular size is  mildly enlarged. 3. The mitral valve is normal in structure. No evidence of mitral valve regurgitation. No evidence of mitral stenosis. 4. The aortic valve was not well visualized. Aortic valve regurgitation is not visualized. No aortic stenosis is present.  Conclusion(s)/Recommendation(s): Very limited echo due to poor sound wave transmission. Also tech unable to obtain apical images due to patient discomfort. RV not seen well but on available images seems mildly dialted and mildly hypokinetic. Consider repeat echo when patient able to toelrate apical imaging.  FINDINGS Left Ventricle: Left ventricular ejection fraction, by estimation, is 60 to 65%. The left ventricle has normal function. The left ventricle has no regional wall motion abnormalities. The left ventricular internal cavity size was normal in size. There is mild concentric left ventricular hypertrophy. Left ventricular diastolic function could not be evaluated.  Right Ventricle: The right ventricular size is mildly enlarged. No increase in right ventricular wall thickness. Right ventricular systolic function is mildly reduced.  Left Atrium: Left atrial size was normal in size.  Right Atrium: Right atrial size was normal in size.  Pericardium: There is no evidence of pericardial effusion.  Mitral Valve: The mitral valve is normal in structure. No evidence of mitral valve regurgitation. No evidence of mitral valve stenosis.  Tricuspid Valve: The tricuspid valve is not well visualized. Tricuspid valve regurgitation is trivial. No evidence of tricuspid stenosis.  Aortic Valve: The aortic valve was not well visualized. Aortic valve regurgitation is not visualized. No aortic stenosis is present.  Pulmonic Valve: The pulmonic valve was not well visualized. Pulmonic valve regurgitation is trivial. No evidence of pulmonic stenosis.  Aorta: The aortic root is normal in size and structure.  Venous: The inferior vena cava was not well  visualized.  IAS/Shunts: No atrial level shunt detected by color flow Doppler.   LEFT VENTRICLE PLAX 2D LVIDd:         3.70 cm LVIDs:         2.90 cm LV PW:         1.30 cm LV IVS:        1.20 cm   Toribio Fuel MD Electronically signed by Toribio Fuel MD Signature Date/Time: 11/30/2023/12:50:42 PM    Final          ______________________________________________________________________________________________           Assessment & Plan   Acute on chronic Heart failure (right heart failure) - complicated by super morbid obesity (BMI 53) OSA and OHS - CKD stage III b - currently requiring high support with BIPAP for acute hypoxic respiratory failure - on 2.5 mcg Milrinone  - on 20 mg IV lasix  drip and metolazone  (5 mg daily) for diuretics will augment; - hypervolemic but improving - High risk of SGLT2i related infection - Prior AKI creatinine peaked at 6.5 (February 2025), creatinine has improved - discussed with critical care nursing and TRH at length: clinically and by CVP hypervolemic and RV dysfunction is the etiology; given mixed picture PH not a candidate for PAH therapies; I do not believe RHC would add to her care unless it appears that she is actually hypovolemic and hypoxic respiratory failure is non-cardiac - discussed with shock team 12/04/23 - nephrology is aware; If no further improvement they are amenable to trial of CVVH; she notes that two family members are on HD and doesn't want to die on HD but also wants to live; she re-affirms full code status - tachycardia is sinus not arrhythmia   Left breast cellulitis- as per surgery/primary DM- Insulin  as per primary Chronic Hypotension- not actively an issue Morbid obesity- complicated by hypoalbuminemia; is able to eat today; encourage nutrition  Reviewed with nursing and family     For questions or updates, please contact [facility] Please consult [email or web address] for contact info under  Cardiology/STEMI.      Stanly Leavens, MD FASE [facility] Cardiologist [facility]  [address] [place], [address] [place], [place] [address] [phone]  10:38 AM

## 2023-12-06 NOTE — Progress Notes (Signed)
 PROGRESS NOTE  CREE NAPOLI FMW:[id number] DOB: 10/03/1955 DOA: 11/29/2023 PCP: [facility]   LOS: 7 days   Brief narrative:  [name] [name] is a 68 y.o. female with medical history significant for insulin -dependent type 2 diabetes, chronic heart failure with preserved EF, morbid obesity, bedbound, hypertension being admitted to the hospital with weight gain and peripheral edema due to suspected acute exacerbation of chronic heart failure with preserved EF.  Cardiology consulted.  Patient also with concerns for left breast cellulitis.  General surgery consulted.  Patient subsequently placed on a Lasix  drip as well as milrinone for diuresis per cardiology and PICC line placed.   Assessment/Plan: Principal Problem:   Acute respiratory failure with hypoxia (HCC) Active Problems:   Acute on chronic heart failure with preserved ejection fraction (HFpEF) (HCC)   Insulin  dependent type 2 diabetes mellitus (HCC)   Hyperlipidemia associated with type 2 diabetes mellitus (HCC)   Anxiety and depression   Morbid obesity (HCC)   OSA (obstructive sleep apnea)   Hyperglycemia due to type 2 diabetes mellitus (HCC)   Chronic kidney disease, stage 3b (HCC)   Obesity hypoventilation syndrome (HCC)   Cellulitis of left breast   COPD with acute exacerbation (HCC)   Intermittent confusion   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  Needed BiPAP in the ED.  2D echocardiogram showed a preserved LV function at this time.  Received IV Lasix  and metolazone initially.  Cardiology was consulted and since patient did not have much urinary output was subsequently subsequently put on  milrinone as well as Lasix  drip starting 12/03/2023.  Patient is overall negative balance for 2345 mL- cardiology feels patient is RV failure due to sleep apnea, hypoxemia, obesity  hypoventilation syndrome patient takes torsemide  20 mg daily at home.  As per cardiology patient is not a candidate for advanced heart failure treatment and RHC would not add value to her care. Cardiology feels the only other option may be CRRT and not sure as to whether patient will be a candidate for this.  Nephrology was also consulted for volume management.  Will follow nephrology recommendation.   Acute COPD exacerbation ABG showed pCO2 of 73.  Continue Solu-Medrol  IV twice daily Pulmicort Brovana and DuoNeb Claritin  PPI empiric antibiotic and BiPAP    OSA with right-sided heart failure in the setting of suspected pulmonary hypertension/obesity hypoventilation syndrome Continue BiPAP at night   AKI on CKD stage 4 Creatinine today at 2.3.  Usual baseline between 2.1-2.6.  On milrinone midodrine  and IV diuretics.  Will closely monitor.  Check BMP in AM.   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  Continue sliding scale insulin .  On IV steroids. Was on Lantus  which was discontinued due to hypoglycemia and was briefly on D5 normal saline.  Latest POC glucose of 242.  Will restart Lantus  at 10 units.  Dose of Lyrica  have been decreased to 75 mg twice a day.  Patient on Solu-Medrol  60 mg twice daily.  Left breast cellulitis versus breast swelling secondary to dependent edema Patient had warmth, erythema, tenderness to palpation on presentation.  Ultrasound of the left breast done on 11/30/2023 with no drainable fluid but some thickening of the skin. MRSA PCR positive.  Patient was seen by general surgery as well and possibility of dependent edema.  Currently on IV Ancef  and linezolid   and plan is to treat empirically for 7 days with IV diuresis. General Surgery recommending nursing place several folded towels under left breast to keep it elevated.   Intermittent confusion. Likely secondary to medication/COPD.  Ammonia within normal limits.  Lyrica  dose was  decreased to twice a day.    COPD exacerbation on BiPAP as needed and nightly.  History of chronic hypotension Currently on midodrine , milrinone and Lasix  drip.   Hyperlipidemia Continue Lipitor   Class III  obesity Body mass index is 50.56 kg/m (pended).  Bedbound due to previous history of lower extremity fracture.  Anxiety/depression - Continue Zoloft.     Concern for vaginal fistula CT abdomen and pelvis without any concern.  DVT prophylaxis: heparin  injection 5,000 Units Start: 11/29/23 2200   Disposition: Possibly home with home health  Status is: Inpatient Remains inpatient appropriate because: Pending clinical improvement.    Code Status:     Code Status: Full Code  Family Communication: Spoke with the patient's sister at bedside.  Consultants: Cardiology and general surgery  Procedures: Ultrasound of the left breast.  Anti-infectives:  Cefazolin  and Zyvox   Anti-infectives (From admission, onward)    Start     Dose/Rate Route Frequency Ordered Stop   12/06/23 2100  ceFAZolin  (ANCEF ) IVPB 2g/100 mL premix        2 g 200 mL/hr over 30 Minutes Intravenous Every 12 hours 12/06/23 0934 12/07/23 2159   12/06/23 0200  ceFAZolin  (ANCEF ) IVPB 2g/100 mL premix  Status:  Discontinued        2 g 200 mL/hr over 30 Minutes Intravenous Every 8 hours 12/05/23 2323 12/06/23 0934   12/02/23 1200  linezolid  (ZYVOX ) tablet 600 mg        600 mg Oral Every 12 hours 12/02/23 1104 12/08/23 2359   11/30/23 1200  ceFAZolin  (ANCEF ) IVPB 2g/100 mL premix  Status:  Discontinued        2 g 200 mL/hr over 30 Minutes Intravenous Every 8 hours 11/30/23 1049 12/05/23 2323       Subjective: Today, patient was seen and examined at bedside.  Nursing staff reported that the patient has been having some loose bowel movements.  Patient complains of shortness of breath and mild dyspnea but little better than yesterday.  States that her leg swelling has gone down.  Objective: Vitals:    12/06/23 1100 12/06/23 1223  BP: (!) 115/41   Pulse: (!) 108   Resp: 17   Temp:  98.2 F (36.8 C)  SpO2: (!) 87%     Intake/Output Summary (Last 24 hours) at 12/06/2023 1300 Last data filed at 12/06/2023 1154 Gross per 24 hour  Intake 1157.02 ml  Output 850 ml  Net 307.02 ml   Filed Weights   12/03/23 0500 12/04/23 0500 12/06/23 0500  Weight: 131.3 kg 131.3 kg 125.4 kg   Body mass index is 50.56 kg/m (pended).   Physical Exam:  GENERAL: Patient is obese, alert awake and oriented. Not in obvious distress.  On nasal cannula oxygen  HENT: No scleral pallor or icterus. Pupils equally reactive to light. Oral mucosa is moist NECK: is supple, no gross swelling noted. CHEST:   Diminished breath sounds bilaterally.  Left breast with edema CVS: S1 and S2 heard, no murmur. Regular rate and rhythm.  ABDOMEN: Soft, obese, non-tender, bowel sounds are present.  Abdominal wall edema noted EXTREMITIES bilateral lower extremity edema ++ upto the thighs. CNS: Cranial nerves are intact. No focal motor deficits. SKIN: warm and dry without rashes.  Data Review: I have personally reviewed the following laboratory data and studies,  CBC: Recent Labs  Lab 12/01/23 0316 12/02/23 0242 12/03/23 0252 12/04/23 0603 12/05/23 0809 12/06/23 0500  WBC 6.1 6.6 10.0 11.1* 9.3 7.9  NEUTROABS 3.4  --  8.0* 9.0* 8.5* 7.1  HGB 12.2 12.7 11.0* 11.1* 11.0* 10.4*  HCT 48.1* 50.0* 42.3 42.6 40.3 39.4  MCV 81.5 82.1 83.6 82.9 80.0 80.1  PLT 376 319 280 292 311 277   Basic Metabolic Panel: Recent Labs  Lab 12/01/23 0316 12/02/23 0242 12/03/23 0252 12/04/23 0603 12/05/23 0809 12/06/23 0500  NA 144 141 144 145 144 138  K 4.2 4.3 4.5 4.0 4.0 3.4*  CL 103 100 103 102 100 96*  CO2 31 30 30 30 29 28   GLUCOSE 104* 132* 62* 72 200* 499*  BUN 27* 26* 27* 28* 33* 35*  CREATININE 2.13* 1.99* 2.23* 2.22* 2.52* 2.38*  CALCIUM  9.2 9.5 9.4 9.5 9.7 9.0  MG 1.8 1.8  --  1.7 2.4 2.0  PHOS 4.7*  --   --    --   --   --    Liver Function Tests: Recent Labs  Lab 11/29/23 1531 12/01/23 0316 12/04/23 0603  AST 15  --  15  ALT 10  --  <5  ALKPHOS 210*  --  164*  BILITOT 0.6  --  0.4  PROT 7.4  --  6.9  ALBUMIN  3.9 3.6 3.4*   No results for input(s): LIPASE, AMYLASE in the last 168 hours. Recent Labs  Lab 12/03/23 1014  AMMONIA 27   Cardiac Enzymes: No results for input(s): CKTOTAL, CKMB, CKMBINDEX, TROPONINI in the last 168 hours. BNP (last 3 results) Recent Labs    01/13/23 1811 02/15/23 2105 03/27/23 1622  BNP 334.2* 432.5* 462.5*    ProBNP (last 3 results) Recent Labs    11/29/23 1531 11/30/23 0300  PROBNP 1,820.0* 1,624.0*    CBG: Recent Labs  Lab 12/05/23 1152 12/05/23 1631 12/05/23 2159 12/06/23 0747 12/06/23 1213  GLUCAP 176* 194* 217* 229* 242*   Recent Results (from the past 240 hours)  MRSA Next Gen by PCR, Nasal     Status: Abnormal   Collection Time: 11/29/23  7:06 PM   Specimen: Nasal Mucosa; Nasal Swab  Result Value Ref Range Status   MRSA by PCR Next Gen DETECTED (A) NOT DETECTED Final    Comment: (NOTE) The GeneXpert MRSA Assay (FDA approved for NASAL specimens only), is one component of a comprehensive MRSA colonization surveillance program. It is not intended to diagnose MRSA infection nor to guide or monitor treatment for MRSA infections. Test performance is not FDA approved in patients less than 25 years old. Performed at [facility], [address], [place], [place] [address]      Studies: CT ABDOMEN PELVIS WO CONTRAST Result Date: 12/05/2023 EXAM: CT ABDOMEN AND PELVIS WITHOUT CONTRAST 12/05/2023 04:03:50 PM TECHNIQUE: CT of the abdomen and pelvis was performed without the administration of intravenous contrast. Multiplanar reformatted images are provided for review. Automated exposure control, iterative reconstruction, and/or weight-based adjustment of the mA/kV was utilized to reduce the radiation  dose to as low as reasonably achievable. COMPARISON: CT 05/16/2023. CLINICAL HISTORY: r/o vaginal fistula. R/o vaginal fistula; - Per RN patient with stool noted coming out of the vagina.; - Patient also with complaints of stool coming out of her vagina stated similar thing occurred about a year ago.; - Check a CT abdomen and pelvis for further evaluation.  FINDINGS: LOWER CHEST: Bilateral lower lobe airspace opacity suspicious for pneumonia. LIVER: The liver is unremarkable. GALLBLADDER AND BILE DUCTS: Gallbladder is unremarkable. No biliary ductal dilatation. SPLEEN: No acute abnormality. PANCREAS: Embolization coils along the pancreatic head. ADRENAL GLANDS: No acute abnormality. KIDNEYS, URETERS AND BLADDER: No stones in the kidneys or ureters. No hydronephrosis. No perinephric or periureteral stranding. Foley catheter in the decompressed bladder. GI AND BOWEL: Stomach demonstrates no acute abnormality. There is no bowel obstruction. Contrast is present in the distal small bowel and throughout the colon. There is contrast within the anterior rectum. No evidence of extension into the vagina. No extraluminal contrast. PERITONEUM AND RETROPERITONEUM: No ascites. No free air. VASCULATURE: Aorta is normal in caliber. Aortic atherosclerotic calcification. LYMPH NODES: No lymphadenopathy. REPRODUCTIVE ORGANS: Hysterectomy. BONES AND SOFT TISSUES: L4-L5 ankylosis. No acute osseous abnormality. No focal soft tissue abnormality. IMPRESSION: 1. No CT evidence of vaginal fistula. 2. Bilateral lower lobe airspace opacities suspicious for pneumonia. Electronically signed by: Norman Gatlin MD 12/05/2023 05:31 PM EDT RP Workstation: [id number]    Vernal Alstrom, MD  [facility] 12/06/2023  If 7PM-7AM, please contact night-coverage

## 2023-12-06 NOTE — Progress Notes (Signed)
 [facility] Progress Note  Subjective:  Good UOP yest, 1.5 L total Much more alert today and interactive  Vitals:   12/06/23 0800 12/06/23 0900 12/06/23 1000 12/06/23 1100  BP: (!) 166/69 (!) 174/73 (!) 104/34 (!) 115/41  Pulse: (!) 102 (!) 115 (!) 109 (!) 108  Resp: 17 18 18 17   Temp: 97.9 F (36.6 C)     TempSrc: Oral     SpO2: 97% 96% 92% (!) 87%  Weight:      Height:        Exam: Gen alert, no distress, bedbound, morbidly obese Sclera anicteric, throat clear  No jvd or bruits Chest clear bilat to bases RRR no MRG Abd soft ntnd no mass or ascites +bs Ext 1-2+ diffuse bilat LE edema, +abd wall edema Neuro is awake, confused, no asterixis     Home bp meds: Demadex  40mg  qam No others     Date                             Creat               eGFR (ml/min) May 2016                    12.6 >> 1.69    AKI episode 2018                            1.49- 1.97 2020                            1.59- 1.88 Mar- aug 2021             1.37- 3.89 Sept-oct 2021              7.15 >> 1.36    AKI  Dec 2021                     0.83- 2.37 Jun 2022                     1.25- 2.55 July-dec 2022              1.38- 3.31 Mar- jul 2023               0.97- 3.80 Aug - oct 2023             2.13- 3.63 Aug - nov 2024   1.52- 2.96 Dec 2024- jan 2025     5.55 >> 2.13    8- 25 ml/min Feb 2025                     6.50 >> 2.68    6- 19 ml/mmin  10/08                           1.94 10/09                           1.98 10/10                           2.13 10/11  1.99 10/12                           2.23 10/13                           2.22 10/14                           2.52    UA pending  CXR 10/08 -> + pulm edema  CXR 10/12 -> + pulm edema  Total I/O = 5.8 L in and 8.4 L out = net neg 2.6 L  Wt are down (throwing 1st wt out) a few kg    Assessment/ Plan: CKD 4: b/l creatinine 2.1- 2.6 from late 2024 to feb 2025, eGFR 19- 25 ml/min. Creat here was 1.9 on  admission and was up to 2.5 at time of consult. Pt admitted w/ acute /chronic diast HF + severe RV dysfunction. Seen by cardiology and IV lasix  was started then titrated up to 120 IV bid, and then changed to IV lasix  gtt which is ongoing now. Rise in creatinine is related to decomp CHF (RV failure+ diastolic) + IV diuresis, however the creatinine remains in her baseline range (Cr 2.1- 2.6). Pt is not a candidate for long-term dialysis due to severe comorbidities. Fortunately creat is down today, she is continuing to diurese and mental status is much better. Also LE edema per the pt has sig improved, and wt's are coming down. Will hold off on CRRT for now, continue medication regimen per cardiology. Will follow.  Acute on chronic heart failure/ vol overload: w/ HFpEF and RV failure. Getting metolazone 5mg  po daily, midodrine  5mg  tid, IV lasix  20mg  /hr and milrinone infusion. Wts down to 125kg (135kg on admit).  Super morbid obesity Bedbound state COPD OHS/ OSA DM2         Rob Geralynn MD  [facility] 12/06/2023, 12:11 PM  Recent Labs  Lab 12/01/23 0316 12/02/23 0242 12/04/23 0603 12/05/23 0809 12/06/23 0500  HGB 12.2   < > 11.1* 11.0* 10.4*  ALBUMIN  3.6  --  3.4*  --   --   CALCIUM  9.2   < > 9.5 9.7 9.0  PHOS 4.7*  --   --   --   --   CREATININE 2.13*   < > 2.22* 2.52* 2.38*  K 4.2   < > 4.0 4.0 3.4*   < > = values in this interval not displayed.   No results for input(s): IRON, TIBC, FERRITIN in the last 168 hours. Inpatient medications:  allopurinol   100 mg Oral QHS   arformoterol  15 mcg Nebulization BID   atorvastatin   10 mg Oral QHS   budesonide (PULMICORT) nebulizer solution  0.5 mg Nebulization BID   calcitRIOL  0.25 mcg Oral Q M,W,F   Chlorhexidine  Gluconate Cloth  6 each Topical Daily   fluticasone  2 spray Each Nare Daily   heparin   5,000 Units Subcutaneous Q8H   insulin  aspart  0-20 Units Subcutaneous TID WC   insulin  aspart  0-5 Units Subcutaneous QHS    ipratropium-albuterol   3 mL Nebulization TID   linezolid   600 mg Oral Q12H   loratadine   10 mg Oral Daily   methylPREDNISolone  (SOLU-MEDROL ) injection  60 mg Intravenous Q12H   metolazone  5 mg Oral Daily   midodrine   5 mg Oral TID WC  nystatin  cream  1 Application Topical BID   oxyCODONE   10 mg Oral BID   pantoprazole   40 mg Oral QAC breakfast   polyethylene glycol  17 g Oral Daily   potassium chloride  SA  20 mEq Oral Daily   pregabalin   75 mg Oral Daily   rOPINIRole  0.25 mg Oral Once   senna-docusate  1 tablet Oral BID   sertraline  50 mg Oral q AM   sodium chloride  flush  10-40 mL Intracatheter Q12H   triamcinolone  1 Application Topical Daily     ceFAZolin  (ANCEF ) IV     furosemide  (LASIX ) 200 mg in dextrose  5 % 100 mL (2 mg/mL) infusion 20 mg/hr (12/06/23 1154)   milrinone 0.25 mcg/kg/min (12/06/23 1202)   acetaminophen  **OR** acetaminophen , albuterol , camphor-menthol , hydrALAZINE , hydrOXYzine , methocarbamol, naphazoline-pheniramine, nystatin , ondansetron  **OR** ondansetron  (ZOFRAN ) IV, mouth rinse, oxyCODONE , sodium chloride  flush, traZODone 

## 2023-12-07 DIAGNOSIS — J9601 Acute respiratory failure with hypoxia: Secondary | ICD-10-CM | POA: Diagnosis not present

## 2023-12-07 DIAGNOSIS — I50813 Acute on chronic right heart failure: Secondary | ICD-10-CM | POA: Diagnosis not present

## 2023-12-07 LAB — GLUCOSE, CAPILLARY
Glucose-Capillary: 242 mg/dL — ABNORMAL HIGH (ref 70–99)
Glucose-Capillary: 283 mg/dL — ABNORMAL HIGH (ref 70–99)
Glucose-Capillary: 286 mg/dL — ABNORMAL HIGH (ref 70–99)
Glucose-Capillary: 227 mg/dL — ABNORMAL HIGH (ref 70–99)

## 2023-12-07 LAB — PHOSPHORUS: Phosphorus: 3.6 mg/dL (ref 2.5–4.6)

## 2023-12-07 LAB — BASIC METABOLIC PANEL WITH GFR
Anion gap: 14 (ref 5–15)
BUN: 43 mg/dL — ABNORMAL HIGH (ref 8–23)
CO2: 30 mmol/L (ref 22–32)
Calcium: 9.8 mg/dL (ref 8.9–10.3)
Chloride: 92 mmol/L — ABNORMAL LOW (ref 98–111)
Creatinine, Ser: 2.6 mg/dL — ABNORMAL HIGH (ref 0.44–1.00)
GFR, Estimated: 19 mL/min — ABNORMAL LOW (ref >60)
Glucose, Bld: 392 mg/dL — ABNORMAL HIGH (ref 70–99)
Potassium: 3.4 mmol/L — ABNORMAL LOW (ref 3.5–5.1)
Sodium: 136 mmol/L (ref 135–145)

## 2023-12-07 LAB — CBC
HCT: 37.8 % (ref 36.0–46.0)
Hemoglobin: 10.4 g/dL — ABNORMAL LOW (ref 12.0–15.0)
MCH: 21.6 pg — ABNORMAL LOW (ref 26.0–34.0)
MCHC: 27.5 g/dL — ABNORMAL LOW (ref 30.0–36.0)
MCV: 78.6 fL — ABNORMAL LOW (ref 80.0–100.0)
Platelets: 259 K/uL (ref 150–400)
RBC: 4.81 MIL/uL (ref 3.87–5.11)
RDW: 21.6 % — ABNORMAL HIGH (ref 11.5–15.5)
WBC: 5.5 K/uL (ref 4.0–10.5)
nRBC: 0 % (ref 0.0–0.2)

## 2023-12-07 LAB — MAGNESIUM: Magnesium: 2.1 mg/dL (ref 1.7–2.4)

## 2023-12-07 MED ORDER — ALTEPLASE 2 MG IJ SOLR
2.0000 mg | Freq: Once | INTRAMUSCULAR | Status: AC
Start: 2023-12-07 — End: 2023-12-18
  Administered 2023-12-18: 2 mg
  Filled 2023-12-07 (×2): qty 2

## 2023-12-07 MED ORDER — POTASSIUM CHLORIDE CRYS ER 20 MEQ PO TBCR
40.0000 meq | EXTENDED_RELEASE_TABLET | Freq: Once | ORAL | Status: AC
  Administered 2023-12-07: 40 meq via ORAL
  Filled 2023-12-07: qty 2

## 2023-12-07 MED ORDER — METHYLPREDNISOLONE SODIUM SUCC 125 MG IJ SOLR
60.0000 mg | INTRAMUSCULAR | Status: DC
  Administered 2023-12-08 – 2023-12-14 (×7): 60 mg via INTRAVENOUS
  Filled 2023-12-07 (×7): qty 2

## 2023-12-07 MED ORDER — RISAQUAD PO CAPS
2.0000 | ORAL_CAPSULE | Freq: Three times a day (TID) | ORAL | Status: DC
  Administered 2023-12-07 – 2023-12-28 (×59): 2 via ORAL
  Filled 2023-12-07 (×65): qty 2

## 2023-12-07 MED ORDER — INSULIN GLARGINE-YFGN 100 UNIT/ML ~~LOC~~ SOLN
20.0000 [IU] | Freq: Every day | SUBCUTANEOUS | Status: DC
  Administered 2023-12-08 – 2023-12-09 (×2): 20 [IU] via SUBCUTANEOUS
  Filled 2023-12-07 (×2): qty 0.2

## 2023-12-07 MED ORDER — INSULIN GLARGINE-YFGN 100 UNIT/ML ~~LOC~~ SOLN
10.0000 [IU] | Freq: Once | SUBCUTANEOUS | Status: AC
  Administered 2023-12-07: 10 [IU] via SUBCUTANEOUS
  Filled 2023-12-07: qty 0.1

## 2023-12-07 MED ORDER — ALTEPLASE 2 MG IJ SOLR
2.0000 mg | Freq: Once | INTRAMUSCULAR | Status: AC
  Administered 2023-12-08: 2 mg
  Filled 2023-12-07: qty 2

## 2023-12-07 NOTE — Progress Notes (Signed)
   12/07/23 0011  BiPAP/CPAP/SIPAP  $ Non-Invasive Home Ventilator  Subsequent  BiPAP/CPAP/SIPAP Pt Type Adult  BiPAP/CPAP/SIPAP V60  Mask Type Full face mask  Dentures removed? Not applicable  Mask Size Medium  Set Rate 16 breaths/min  Respiratory Rate 16 breaths/min  IPAP 22 cmH20  EPAP 10 cmH2O  PEEP 10 cmH20  FiO2 (%) 40 %  Minute Ventilation 5.4  Leak 0  Peak Inspiratory Pressure (PIP) 22  Tidal Volume (Vt) 341  Patient Home Machine No  Patient Home Mask No  Patient Home Tubing No  Auto Titrate No  Press High Alarm 35 cmH2O  Press Low Alarm 5 cmH2O  Device Plugged into RED Power Outlet Yes  BiPAP/CPAP /SiPAP Vitals  Pulse Rate (!) 105  Resp 16  SpO2 94 %  MEWS Score/Color  MEWS Score 1  MEWS Score Color Green

## 2023-12-07 NOTE — Progress Notes (Signed)
  [facility] Progress Note  Subjective:  UOP 1 L yesterday, I/o about even   Vitals:   12/07/23 1201 12/07/23 1240 12/07/23 1300 12/07/23 1336  BP: (!) 93/43 (!) 102/34 122/66 (!) 98/48  Pulse: (!) 117 (!) 109 (!) 114 (!) 120  Resp: 16 17 19    Temp:      TempSrc:      SpO2: 92% 94% 91% (!) 79%  Weight:      Height:        Exam: Gen alert, no distress, bedbound, morbidly obese Sclera anicteric, throat clear  No jvd or bruits Chest clear bilat to bases RRR no MRG Abd soft ntnd no mass or ascites +bs Ext 1+ diffuse bilat LE edema, has improved Neuro is alert, and awake, Ox 3  Chronic debilitated   Home bp meds: Demadex  40mg  qam No others     Date                             Creat               eGFR (ml/min) May 2016                    12.6 >> 1.69    AKI episode 2018                            1.49- 1.97 2020                            1.59- 1.88 Mar- aug 2021             1.37- 3.89 Sept-oct 2021              7.15 >> 1.36    AKI  Dec 2021                     0.83- 2.37 Jun 2022                     1.25- 2.55 July-dec 2022              1.38- 3.31 Mar- jul 2023               0.97- 3.80 Aug - oct 2023             2.13- 3.63 Aug - nov 2024   1.52- 2.96 Dec 2024- jan 2025     5.55 >> 2.13    8- 25 ml/min Feb 2025                     6.50 >> 2.68    6- 19 ml/mmin  10/08                           1.94 10/09                           1.98 10/10                           2.13 10/11                           1.99 10/12  2.23 10/13                           2.22 10/14                           2.52    UA pending  CXR 10/08 -> + pulm edema  CXR 10/12 -> + pulm edema  Total I/O = 5.8 L in and 8.4 L out = net neg 2.6 L  Wt are down (throwing 1st wt out) a few kg    Assessment/ Plan: CKD 4: b/l creatinine 2.1- 2.6 from late 2024 to feb 2025, eGFR 19- 25 ml/min. Creat here was 1.9 on admission and was up to 2.5 at time of consult. Pt  admitted w/ acute /chronic diast HF + severe RV dysfunction. Seen by cardiology and IV lasix  was started then titrated up to 120 IV bid, and then changed to IV lasix  gtt which is ongoing now. Rise in creatinine is related to decomp CHF (RV failure+ diastolic) + IV diuresis, however the creatinine remains in her baseline range (Cr 2.1- 2.6). Pt is not a candidate for long-term dialysis due to severe comorbidities and bedbound state. Creat up at 2.6 today. MS remains good, UOP okay, ~ 1 L /d. Will cont to hold off on CRRT as long as diuresing okay, pt looking better and creat close to her normal range.  Acute on chronic heart failure/ vol overload: w/ HFpEF and RV failure. Getting metolazone 5mg  po daily, midodrine  5mg  tid, IV lasix  20mg  /hr and milrinone infusion. Wts down to 125kg (135kg on admit).  Super morbid obesity Bedbound state COPD OHS/ OSA DM2         Rob Loucille Takach MD  [facility] 12/07/2023, 2:02 PM  Recent Labs  Lab 12/01/23 0316 12/02/23 0242 12/04/23 0603 12/05/23 0809 12/06/23 0500 12/07/23 0525  HGB 12.2   < > 11.1*   < > 10.4* 10.4*  ALBUMIN  3.6  --  3.4*  --   --   --   CALCIUM  9.2   < > 9.5   < > 9.0 9.8  PHOS 4.7*  --   --   --   --  3.6  CREATININE 2.13*   < > 2.22*   < > 2.38* 2.60*  K 4.2   < > 4.0   < > 3.4* 3.4*   < > = values in this interval not displayed.   No results for input(s): IRON, TIBC, FERRITIN in the last 168 hours. Inpatient medications:  acidophilus  2 capsule Oral TID   allopurinol   100 mg Oral QHS   arformoterol  15 mcg Nebulization BID   atorvastatin   10 mg Oral QHS   budesonide (PULMICORT) nebulizer solution  0.5 mg Nebulization BID   calcitRIOL  0.25 mcg Oral Q M,W,F   Chlorhexidine  Gluconate Cloth  6 each Topical Daily   fluticasone  2 spray Each Nare Daily   heparin   5,000 Units Subcutaneous Q8H   insulin  aspart  0-20 Units Subcutaneous TID WC   insulin  aspart  0-5 Units Subcutaneous QHS   [START ON 12/08/2023] insulin   glargine-yfgn  20 Units Subcutaneous Daily   ipratropium-albuterol   3 mL Nebulization TID   linezolid   600 mg Oral Q12H   loratadine   10 mg Oral Daily   [START ON 12/08/2023] methylPREDNISolone  (SOLU-MEDROL ) injection  60 mg Intravenous Q24H   metolazone  5 mg Oral  Daily   midodrine   5 mg Oral TID WC   nystatin  cream  1 Application Topical BID   oxyCODONE   10 mg Oral BID   pantoprazole   40 mg Oral QAC breakfast   polyethylene glycol  17 g Oral Daily   pregabalin   75 mg Oral Daily   rOPINIRole  0.25 mg Oral Once   sertraline  50 mg Oral q AM   sodium chloride  flush  10-40 mL Intracatheter Q12H   triamcinolone  1 Application Topical Daily    furosemide  (LASIX ) 200 mg in dextrose  5 % 100 mL (2 mg/mL) infusion 20 mg/hr (12/07/23 1245)   milrinone 0.25 mcg/kg/min (12/07/23 1119)   acetaminophen  **OR** acetaminophen , albuterol , camphor-menthol , hydrALAZINE , hydrOXYzine , methocarbamol, naphazoline-pheniramine, nystatin , ondansetron  **OR** ondansetron  (ZOFRAN ) IV, mouth rinse, oxyCODONE , sodium chloride  flush, traZODone 

## 2023-12-07 NOTE — Progress Notes (Signed)
 Progress Note  Patient Name: AMIAYAH GIEBEL Date of Encounter: 12/07/2023 Primary Cardiologist: Jerel Balding, MD   Subjective   *Has diuresed an additional -2 Liters. Down to 5.5 L.  This is the best she has done in my evaluations of her.  Vital Signs    Vitals:   12/07/23 0400 12/07/23 0448 12/07/23 0500 12/07/23 0600  BP: (!) 116/49  (!) 100/42 132/65  Pulse: 92 93 92 97  Resp: 12 17 17 12   Temp:      TempSrc:      SpO2: 95% 96% 92% 95%  Weight:   130.4 kg   Height:        Intake/Output Summary (Last 24 hours) at 12/07/2023 0806 Last data filed at 12/07/2023 9362 Gross per 24 hour  Intake 775.82 ml  Output 1050 ml  Net -274.18 ml   Filed Weights   12/04/23 0500 12/06/23 0500 12/07/23 0500  Weight: 131.3 kg 125.4 kg 130.4 kg    Physical Exam   GEN: Morbid obesity, improved asterixis  Neck: Thick neck Cardiac: Regular tachycardia, no murmurs, rubs, or gallops but distant heart sounds   Respiratory: Decreased breath sounds  GI: Soft, nontender, non-distended  MS: bilateral edema; legs are warm   Labs   Telemetry: sinus tachycardia, VT is artifact   Chemistry Recent Labs  Lab 12/01/23 0316 12/02/23 0242 12/04/23 0603 12/05/23 0809 12/06/23 0500 12/07/23 0525  NA 144   < > 145 144 138 136  K 4.2   < > 4.0 4.0 3.4* 3.4*  CL 103   < > 102 100 96* 92*  CO2 31   < > 30 29 28 30   GLUCOSE 104*   < > 72 200* 499* 392*  BUN 27*   < > 28* 33* 35* 43*  CREATININE 2.13*   < > 2.22* 2.52* 2.38* 2.60*  CALCIUM  9.2   < > 9.5 9.7 9.0 9.8  PROT  --   --  6.9  --   --   --   ALBUMIN  3.6  --  3.4*  --   --   --   AST  --   --  15  --   --   --   ALT  --   --  <5  --   --   --   ALKPHOS  --   --  164*  --   --   --   BILITOT  --   --  0.4  --   --   --   GFRNONAA 24*   < > 23* 20* 21* 19*  ANIONGAP 10   < > 12 15 15 14    < > = values in this interval not displayed.     Hematology Recent Labs  Lab 12/05/23 0809 12/06/23 0500 12/07/23 0525  WBC 9.3  7.9 5.5  RBC 5.04 4.92 4.81  HGB 11.0* 10.4* 10.4*  HCT 40.3 39.4 37.8  MCV 80.0 80.1 78.6*  MCH 21.8* 21.1* 21.6*  MCHC 27.3* 26.4* 27.5*  RDW 21.9* 21.9* 21.6*  PLT 311 277 259     Cardiac Studies   Cardiac Studies & Procedures   ______________________________________________________________________________________________     ECHOCARDIOGRAM  ECHOCARDIOGRAM COMPLETE 11/30/2023  Narrative ECHOCARDIOGRAM REPORT    Patient Name:   MEERA VASCO Date of Exam: 11/30/2023 Medical Rec #:  [id number]          Height:       62.5 in Accession #:  [id number]         Weight:       267.0 lb Date of Birth:  [birth year]          BSA:          2.174 m Patient Age:    68 years           BP:           135/81 mmHg Patient Gender: F                  HR:           87 bpm. Exam Location:  Inpatient  Procedure: 2D Echo, Cardiac Doppler, Color Doppler and Intracardiac Opacification Agent (Both Spectral and Color Flow Doppler were utilized during procedure).  Indications:    CHF-Acute Diastolic I50.31  History:        Patient has prior history of Echocardiogram examinations, most recent 03/28/2023. Risk Factors:Hypertension and Diabetes.  Sonographer:    Jayson Gaskins Referring Phys: [id number] [facility]   Sonographer Comments: Technically challenging study due to limited acoustic windows. No apicals due to pain. IMPRESSIONS   1. Left ventricular ejection fraction, by estimation, is 60 to 65%. The left ventricle has normal function. The left ventricle has no regional wall motion abnormalities. There is mild concentric left ventricular hypertrophy. Left ventricular diastolic function could not be evaluated. 2. Right ventricular systolic function is mildly reduced. The right ventricular size is mildly enlarged. 3. The mitral valve is normal in structure. No evidence of mitral valve regurgitation. No evidence of mitral stenosis. 4. The aortic valve was not well visualized. Aortic  valve regurgitation is not visualized. No aortic stenosis is present.  Conclusion(s)/Recommendation(s): Very limited echo due to poor sound wave transmission. Also tech unable to obtain apical images due to patient discomfort. RV not seen well but on available images seems mildly dialted and mildly hypokinetic. Consider repeat echo when patient able to toelrate apical imaging.  FINDINGS Left Ventricle: Left ventricular ejection fraction, by estimation, is 60 to 65%. The left ventricle has normal function. The left ventricle has no regional wall motion abnormalities. The left ventricular internal cavity size was normal in size. There is mild concentric left ventricular hypertrophy. Left ventricular diastolic function could not be evaluated.  Right Ventricle: The right ventricular size is mildly enlarged. No increase in right ventricular wall thickness. Right ventricular systolic function is mildly reduced.  Left Atrium: Left atrial size was normal in size.  Right Atrium: Right atrial size was normal in size.  Pericardium: There is no evidence of pericardial effusion.  Mitral Valve: The mitral valve is normal in structure. No evidence of mitral valve regurgitation. No evidence of mitral valve stenosis.  Tricuspid Valve: The tricuspid valve is not well visualized. Tricuspid valve regurgitation is trivial. No evidence of tricuspid stenosis.  Aortic Valve: The aortic valve was not well visualized. Aortic valve regurgitation is not visualized. No aortic stenosis is present.  Pulmonic Valve: The pulmonic valve was not well visualized. Pulmonic valve regurgitation is trivial. No evidence of pulmonic stenosis.  Aorta: The aortic root is normal in size and structure.  Venous: The inferior vena cava was not well visualized.  IAS/Shunts: No atrial level shunt detected by color flow Doppler.   LEFT VENTRICLE PLAX 2D LVIDd:         3.70 cm LVIDs:         2.90 cm LV PW:         1.30 cm  LV IVS:         1.20 cm   Toribio Fuel MD Electronically signed by Toribio Fuel MD Signature Date/Time: 11/30/2023/12:50:42 PM    Final          ______________________________________________________________________________________________           Assessment & Plan   Acute on chronic Heart failure (right heart failure) - complicated by super morbid obesity (BMI 53) OSA and OHS - CKD stage III b - on 2.5 mcg Milrinone  - on 20 mg IV lasix  drip and metolazone  (5 mg daily) for diuretics,  hypervolemic but improving with decrease O2 need - High risk of SGLT2i related infection - discussed with shock team 12/04/23, no plans for RHC at this time (see prior notes) - nephrology is aware and following  - tachycardia is sinus not arrhythmia - [employer] (her SO) and Son Osborn Amis) are no questions, his HD day is tomorrow  Left breast cellulitis- as per surgery/primary DM- Insulin  as per primary Chronic Hypotension- not actively an issue Morbid obesity- complicated by hypoalbuminemia; is able to eat; encourage nutrition   For questions or updates, please contact [facility] Please consult [email or web address] for contact info under Cardiology/STEMI.      Stanly Leavens, MD FASE [facility] Cardiologist [facility]  [address] [place], [address] [place], [place] [address] [phone]  8:06 AM

## 2023-12-07 NOTE — Progress Notes (Addendum)
 PROGRESS NOTE  [name] [name] FMW:[id number] DOB: 1955/11/24 DOA: 11/29/2023 [facility]CP: [facility]   LOS: 8 days   Brief narrative:  [name] [name] is a 68 y.o. female with medical history significant for insulin -dependent type 2 diabetes, chronic heart failure with preserved EF, morbid obesity, bedbound, hypertension being admitted to the hospital with weight gain and peripheral edema due to suspected acute exacerbation of chronic heart failure with preserved EF.  Cardiology consulted.  Patient also with concerns for left breast cellulitis.  General surgery consulted.  Patient subsequently placed on a Lasix  drip as well as milrinone for diuresis per cardiology and PICC line placed.   Assessment/Plan: Principal Problem:   Acute respiratory failure with hypoxia (HCC) Active Problems:   Acute on chronic heart failure with preserved ejection fraction (HFpEF) (HCC)   Insulin  dependent type 2 diabetes mellitus (HCC)   Hyperlipidemia associated with type 2 diabetes mellitus (HCC)   Anxiety and depression   Morbid obesity (HCC)   OSA (obstructive sleep apnea)   Hyperglycemia due to type 2 diabetes mellitus (HCC)   Chronic kidney disease, stage 3b (HCC)   Obesity hypoventilation syndrome (HCC)   Cellulitis of left breast   COPD with acute exacerbation (HCC)   Intermittent confusion   Vaginal fistula  acute on chronic respiratory failure with hypoxia secondary to acute on chronic heart failure with HFpEF  On 5 L of oxygen  by nasal cannula at baseline.    2D echocardiogram showed a preserved LV function at this time.  Received IV Lasix  and metolazone initially.  Cardiology was consulted and since patient did not have much urinary output was subsequently subsequently put on  milrinone as well as Lasix  drip starting 12/03/2023.  Patient is overall negative balance for 2767 mL- cardiology feels patient is RV failure due to sleep apnea, hypoxemia, obesity hypoventilation syndrome  patient takes torsemide  20 mg daily at home.  As per cardiology, patient is not a candidate for advanced heart failure treatment and [facility] would not add value to her care. .  Nephrology was also consulted for volume management.  Currently still on Lasix  drip.  Acute COPD exacerbation Initial ABG showed pCO2 of 73.  On Solu-Medrol  IV twice daily Pulmicort Brovana and DuoNeb, Claritin  PPI empiric antibiotic and BiPAP.  Will decrease Solu-Medrol  to 60 daily.  Patient usually uses BiPAP at home during the nighttime.  Has been continued while in the hospital  Hypokalemia.  Replenished with 40 mill equivalents of potassium today.  Check levels in AM.  Diarrhea.  Uncertain etiology.  Patient is on linezolid .  Add probiotics.  On rectal tube.  Check for GI pathogen panel.  If negative could consider antimotility agents.    OSA with right-sided heart failure in the setting of suspected pulmonary hypertension/obesity hypoventilation syndrome Continue BiPAP at night   AKI on CKD stage 4 Creatinine today at 2.6 from 2.3 yesterday..  Usual baseline between 2.1-2.6.  On milrinone drip, midodrine  and IV diuretics.  Will closely monitor.  Check BMP in AM.   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  Continue sliding scale insulin .  On IV steroids.  Will decrease the dose of steroids today. Was on Lantus  which was discontinued due to hypoglycemia and was briefly on D5 normal saline.  Latest POC glucose of 242.  Long-acting insulin  Semglee  10 units was started yesterday.  Will increase the dose to 20 units.  Dose of Lyrica  have been decreased to 75 mg twice a  day.    Left breast cellulitis versus breast swelling secondary to dependent edema  Currently on linezolid  plan is to treat empirically for 7 days with IV diuresis. General Surgery recommending nursing place several folded towels under left breast to keep it elevated.   Intermittent confusion. Improved  likely secondary to medication/COPD.  Ammonia within normal limits.  Lyrica  dose was decreased to twice a day.     chronic hypotension Currently on midodrine , milrinone and Lasix  drip.  Latest blood pressure of 130/62   Hyperlipidemia Continue Lipitor   Class III  obesity Body mass index is 52.58 kg/m (pended).  Bedbound due to previous history of lower extremity fracture.  Anxiety/depression - Continue Zoloft.     Concern for vaginal fistula CT abdomen and pelvis without any concern.  Fistula ruled out  DVT prophylaxis: heparin  injection 5,000 Units Start: 11/29/23 2200   Disposition: Home with home health likely in 2 to 3 days  Status is: Inpatient Remains inpatient appropriate because: Pending clinical improvement.  IV diuresis, inotrope    Code Status:     Code Status: Full Code  Family Communication: Spoke with the patient's friend at bedside  Consultants: Cardiology and  general surgery  Procedures: Ultrasound of the left breast. Right upper extremity PICC line placement Rectal tube placement  Anti-infectives:  Zyvox   Anti-infectives (From admission, onward)    Start     Dose/Rate Route Frequency Ordered Stop   12/06/23 2100  ceFAZolin  (ANCEF ) IVPB 2g/100 mL premix        2 g 200 mL/hr over 30 Minutes Intravenous Every 12 hours 12/06/23 0934 12/07/23 1018   12/06/23 0200  ceFAZolin  (ANCEF ) IVPB 2g/100 mL premix  Status:  Discontinued        2 g 200 mL/hr over 30 Minutes Intravenous Every 8 hours 12/05/23 2323 12/06/23 0934   12/02/23 1200  linezolid  (ZYVOX ) tablet 600 mg        600 mg Oral Every 12 hours 12/02/23 1104 12/08/23 2359   11/30/23 1200  ceFAZolin  (ANCEF ) IVPB 2g/100 mL premix  Status:  Discontinued        2 g 200 mL/hr over 30 Minutes Intravenous Every 8 hours 11/30/23 1049 12/05/23 2323       Subjective: Today, patient was seen and examined at bedside.  Patient states that she has been having a lot of diarrhea has been having some  cough but no dyspnea has had some whitish phlegm production.  Denies any chest pain.  No fever or chills  Objective: Vitals:   12/07/23 1000 12/07/23 1030  BP: (!) 112/48 (!) 124/59  Pulse: (!) 111 (!) 109  Resp: (!) 28 (!) 22  Temp:    SpO2: 95% 93%    Intake/Output Summary (Last 24 hours) at 12/07/2023 1111 Last data filed at 12/07/2023 1029 Gross per 24 hour  Intake 955.2 ml  Output 1050 ml  Net -94.8 ml   Filed Weights   12/04/23 0500 12/06/23 0500 12/07/23 0500  Weight: 131.3 kg 125.4 kg 130.4 kg   Body mass index is 52.58 kg/m (pended).   Physical Exam:  GENERAL: Patient is obese, alert awake and oriented. Not in obvious distress.  On nasal cannula oxygen  HENT: No scleral pallor or icterus. Pupils equally reactive to light. Oral mucosa is moist NECK: is supple, no gross swelling noted. CHEST:   Diminished breath sounds bilaterally.  Left breast with edema CVS: S1 and S2 heard, no murmur. Regular rate and rhythm.  ABDOMEN: Soft, obese, non-tender,  bowel sounds are present.  Abdominal wall edema noted, rectal tube in place, Foley catheter in place EXTREMITIES bilateral lower extremity edema ++ upto the thighs. CNS: Cranial nerves are intact. No focal motor deficits. SKIN: warm and dry without rashes.  Data Review: I have personally reviewed the following laboratory data and studies,  CBC: Recent Labs  Lab 12/01/23 0316 12/02/23 0242 12/03/23 0252 12/04/23 0603 12/05/23 0809 12/06/23 0500 12/07/23 0525  WBC 6.1   < > 10.0 11.1* 9.3 7.9 5.5  NEUTROABS 3.4  --  8.0* 9.0* 8.5* 7.1  --   HGB 12.2   < > 11.0* 11.1* 11.0* 10.4* 10.4*  HCT 48.1*   < > 42.3 42.6 40.3 39.4 37.8  MCV 81.5   < > 83.6 82.9 80.0 80.1 78.6*  PLT 376   < > 280 292 311 277 259   < > = values in this interval not displayed.   Basic Metabolic Panel: Recent Labs  Lab 12/01/23 0316 12/02/23 0242 12/03/23 0252 12/04/23 0603 12/05/23 0809 12/06/23 0500 12/07/23 0525  NA 144 141 144  145 144 138 136  K 4.2 4.3 4.5 4.0 4.0 3.4* 3.4*  CL 103 100 103 102 100 96* 92*  CO2 31 30 30 30 29 28 30   GLUCOSE 104* 132* 62* 72 200* 499* 392*  BUN 27* 26* 27* 28* 33* 35* 43*  CREATININE 2.13* 1.99* 2.23* 2.22* 2.52* 2.38* 2.60*  CALCIUM  9.2 9.5 9.4 9.5 9.7 9.0 9.8  MG 1.8 1.8  --  1.7 2.4 2.0 2.1  PHOS 4.7*  --   --   --   --   --  3.6   Liver Function Tests: Recent Labs  Lab 12/01/23 0316 12/04/23 0603  AST  --  15  ALT  --  <5  ALKPHOS  --  164*  BILITOT  --  0.4  PROT  --  6.9  ALBUMIN  3.6 3.4*   No results for input(s): LIPASE, AMYLASE in the last 168 hours. Recent Labs  Lab 12/03/23 1014  AMMONIA 27   Cardiac Enzymes: No results for input(s): CKTOTAL, CKMB, CKMBINDEX, TROPONINI in the last 168 hours. BNP (last 3 results) Recent Labs    01/13/23 1811 02/15/23 2105 03/27/23 1622  BNP 334.2* 432.5* 462.5*    ProBNP (last 3 results) Recent Labs    11/29/23 1531 11/30/23 0300  PROBNP 1,820.0* 1,624.0*    CBG: Recent Labs  Lab 12/06/23 0747 12/06/23 1213 12/06/23 1631 12/06/23 2116 12/07/23 0734  GLUCAP 229* 242* 220* 205* 242*   Recent Results (from the past 240 hours)  MRSA Next Gen by PCR, Nasal     Status: Abnormal   Collection Time: 11/29/23  7:06 PM   Specimen: Nasal Mucosa; Nasal Swab  Result Value Ref Range Status   MRSA by PCR Next Gen DETECTED (A) NOT DETECTED Final    Comment: (NOTE) The GeneXpert MRSA Assay (FDA approved for NASAL specimens only), is one component of a comprehensive MRSA colonization surveillance program. It is not intended to diagnose MRSA infection nor to guide or monitor treatment for MRSA infections. Test performance is not FDA approved in patients less than 58 years old. Performed at [facility], [address], [place], [place] [address]      Studies: CT ABDOMEN PELVIS WO CONTRAST Result Date: 12/05/2023 EXAM: CT ABDOMEN AND PELVIS WITHOUT CONTRAST 12/05/2023 04:03:50  PM TECHNIQUE: CT of the abdomen and pelvis was performed without the administration of intravenous contrast. Multiplanar reformatted  images are provided for review. Automated exposure control, iterative reconstruction, and/or weight-based adjustment of the mA/kV was utilized to reduce the radiation dose to as low as reasonably achievable. COMPARISON: CT 05/16/2023. CLINICAL HISTORY: r/o vaginal fistula. R/o vaginal fistula; - Per RN patient with stool noted coming out of the vagina.; - Patient also with complaints of stool coming out of her vagina stated similar thing occurred about a year ago.; - Check a CT abdomen and pelvis for further evaluation. FINDINGS: LOWER CHEST: Bilateral lower lobe airspace opacity suspicious for pneumonia. LIVER: The liver is unremarkable. GALLBLADDER AND BILE DUCTS: Gallbladder is unremarkable. No biliary ductal dilatation. SPLEEN: No acute abnormality. PANCREAS: Embolization coils along the pancreatic head. ADRENAL GLANDS: No acute abnormality. KIDNEYS, URETERS AND BLADDER: No stones in the kidneys or ureters. No hydronephrosis. No perinephric or periureteral stranding. Foley catheter in the decompressed bladder. GI AND BOWEL: Stomach demonstrates no acute abnormality. There is no bowel obstruction. Contrast is present in the distal small bowel and throughout the colon. There is contrast within the anterior rectum. No evidence of extension into the vagina. No extraluminal contrast. PERITONEUM AND RETROPERITONEUM: No ascites. No free air. VASCULATURE: Aorta is normal in caliber. Aortic atherosclerotic calcification. LYMPH NODES: No lymphadenopathy. REPRODUCTIVE ORGANS: Hysterectomy. BONES AND SOFT TISSUES: L4-L5 ankylosis. No acute osseous abnormality. No focal soft tissue abnormality. IMPRESSION: 1. No CT evidence of vaginal fistula. 2. Bilateral lower lobe airspace opacities suspicious for pneumonia. Electronically signed by: Norman Gatlin MD 12/05/2023 05:31 PM EDT RP Workstation:  [id number]    Vernal Alstrom, MD  [facility] 12/07/2023  If 7PM-7AM, please contact night-coverage

## 2023-12-08 ENCOUNTER — Inpatient Hospital Stay (HOSPITAL_COMMUNITY)

## 2023-12-08 DIAGNOSIS — J9601 Acute respiratory failure with hypoxia: Secondary | ICD-10-CM | POA: Diagnosis not present

## 2023-12-08 DIAGNOSIS — I50813 Acute on chronic right heart failure: Secondary | ICD-10-CM | POA: Diagnosis not present

## 2023-12-08 LAB — BLOOD GAS, ARTERIAL
Acid-Base Excess: 6.3 mmol/L — ABNORMAL HIGH (ref 0.0–2.0)
Bicarbonate: 35.1 mmol/L — ABNORMAL HIGH (ref 20.0–28.0)
Delivery systems: POSITIVE
Drawn by: 20012
Expiratory PAP: 10 cmH2O
FIO2: 50 %
Inspiratory PAP: 20 cmH2O
O2 Content: POSITIVE L/min
O2 Saturation: 95.5 %
Patient temperature: 36.9
RATE: 16 {breaths}/min
pCO2 arterial: 73 mmHg (ref 32–48)
pH, Arterial: 7.29 — ABNORMAL LOW (ref 7.35–7.45)
pO2, Arterial: 71 mmHg — ABNORMAL LOW (ref 83–108)

## 2023-12-08 LAB — GASTROINTESTINAL PANEL BY PCR, STOOL (REPLACES STOOL CULTURE)

## 2023-12-08 LAB — BASIC METABOLIC PANEL WITH GFR
Anion gap: 12 (ref 5–15)
BUN: 51 mg/dL — ABNORMAL HIGH (ref 8–23)
CO2: 32 mmol/L (ref 22–32)
Calcium: 10.3 mg/dL (ref 8.9–10.3)
Chloride: 91 mmol/L — ABNORMAL LOW (ref 98–111)
Creatinine, Ser: 2.66 mg/dL — ABNORMAL HIGH (ref 0.44–1.00)
GFR, Estimated: 19 mL/min — ABNORMAL LOW (ref >60)
Glucose, Bld: 283 mg/dL — ABNORMAL HIGH (ref 70–99)
Potassium: 3.3 mmol/L — ABNORMAL LOW (ref 3.5–5.1)
Sodium: 136 mmol/L (ref 135–145)

## 2023-12-08 LAB — CBC
HCT: 39.9 % (ref 36.0–46.0)
Hemoglobin: 11.3 g/dL — ABNORMAL LOW (ref 12.0–15.0)
MCH: 21.9 pg — ABNORMAL LOW (ref 26.0–34.0)
MCHC: 28.3 g/dL — ABNORMAL LOW (ref 30.0–36.0)
MCV: 77.2 fL — ABNORMAL LOW (ref 80.0–100.0)
Platelets: 282 K/uL (ref 150–400)
RBC: 5.17 MIL/uL — ABNORMAL HIGH (ref 3.87–5.11)
RDW: 21.7 % — ABNORMAL HIGH (ref 11.5–15.5)
WBC: 4.3 K/uL (ref 4.0–10.5)
nRBC: 0 % (ref 0.0–0.2)

## 2023-12-08 LAB — GLUCOSE, CAPILLARY
Glucose-Capillary: 274 mg/dL — ABNORMAL HIGH (ref 70–99)
Glucose-Capillary: 266 mg/dL — ABNORMAL HIGH (ref 70–99)
Glucose-Capillary: 242 mg/dL — ABNORMAL HIGH (ref 70–99)
Glucose-Capillary: 266 mg/dL — ABNORMAL HIGH (ref 70–99)
Glucose-Capillary: 278 mg/dL — ABNORMAL HIGH (ref 70–99)

## 2023-12-08 LAB — PHOSPHORUS: Phosphorus: 3.2 mg/dL (ref 2.5–4.6)

## 2023-12-08 LAB — MAGNESIUM: Magnesium: 2.1 mg/dL (ref 1.7–2.4)

## 2023-12-08 MED ORDER — LOPERAMIDE HCL 2 MG PO CAPS
2.0000 mg | ORAL_CAPSULE | Freq: Once | ORAL | Status: AC
  Administered 2023-12-08: 2 mg via ORAL
  Filled 2023-12-08: qty 1

## 2023-12-08 MED ORDER — POTASSIUM CHLORIDE CRYS ER 20 MEQ PO TBCR
40.0000 meq | EXTENDED_RELEASE_TABLET | Freq: Every day | ORAL | Status: DC
  Administered 2023-12-08 – 2023-12-13 (×6): 40 meq via ORAL
  Filled 2023-12-08 (×6): qty 2

## 2023-12-08 NOTE — Progress Notes (Signed)
   12/08/23 0309  BiPAP/CPAP/SIPAP  $ Non-Invasive Home Ventilator  Subsequent  BiPAP/CPAP/SIPAP Pt Type Adult  BiPAP/CPAP/SIPAP V60  Mask Type Full face mask  Dentures removed? Not applicable  Mask Size Medium  Set Rate 16 breaths/min  Respiratory Rate 17 breaths/min  IPAP 22 cmH20  EPAP 10 cmH2O  PEEP 10 cmH20  FiO2 (%) 40 %  Minute Ventilation 10.1  Leak 12  Peak Inspiratory Pressure (PIP) 21  Tidal Volume (Vt) 501  Patient Home Machine No  Patient Home Mask No  Patient Home Tubing No  Auto Titrate No  Press High Alarm 35 cmH2O  Press Low Alarm 5 cmH2O  Device Plugged into RED Power Outlet Yes  BiPAP/CPAP /SiPAP Vitals  Pulse Rate (!) 108  Resp 17  SpO2 93 %  MEWS Score/Color  MEWS Score 1  MEWS Score Color Green

## 2023-12-08 NOTE — Progress Notes (Signed)
 PROGRESS NOTE  [name] [name] FMW:[id number] DOB: 05/31/1955 DOA: 11/29/2023 [facility]   LOS: 9 days   Brief narrative:  [name] [name] is a 68 y.o. female with medical history significant for insulin -dependent type 2 diabetes, chronic heart failure with preserved EF, morbid obesity, bedbound, hypertension being admitted to the hospital with weight gain and peripheral edema due to suspected acute exacerbation of chronic heart failure with preserved EF.  Cardiology consulted.  Patient also with concerns for left breast cellulitis.  General surgery consulted.  Patient subsequently placed on a Lasix  drip as well as milrinone for diuresis per cardiology and PICC line placed.  At this time cardiology following.  Still on diuresis and inotropes.  Assessment/Plan: Principal Problem:   Acute respiratory failure with hypoxia (HCC) Active Problems:   Acute on chronic heart failure with preserved ejection fraction (HFpEF) (HCC)   Insulin  dependent type 2 diabetes mellitus (HCC)   Hyperlipidemia associated with type 2 diabetes mellitus (HCC)   Anxiety and depression   Morbid obesity (HCC)   OSA (obstructive sleep apnea)   Hyperglycemia due to type 2 diabetes mellitus (HCC)   Chronic kidney disease, stage 3b (HCC)   Obesity hypoventilation syndrome (HCC)   Cellulitis of left breast   COPD with acute exacerbation (HCC)   Intermittent confusion   Vaginal fistula  Acute on chronic respiratory failure with hypoxia secondary to acute on chronic heart failure with HFpEF  On 5 L of oxygen  by nasal cannula at baseline. 2D echocardiogram showed a preserved LV function at this time.  Received IV Lasix  and metolazone initially.  Cardiology was consulted and since patient did not have much urinary output was subsequently subsequently put on  milrinone drip as well as Lasix  drip starting 12/03/2023.  Patient is overall negative balance for 3977 mL patient is on torsemide  20 mg daily at  home.  As per cardiology, patient is not a candidate for advanced heart failure treatment and [facility] would not add value to her care. .  Nephrology was also consulted for volume management but no need for CRRT.  Currently still on Lasix  drip.  Acute COPD exacerbation Initial ABG showed pCO2 of 73.  On Solu-Medrol  daily from 11/27/2023 will continue to taper, Pulmicort Brovana and DuoNeb, Claritin  PPI empiric antibiotic and BiPAP.   Hypokalemia.  Potassium of 3.3.  Will replace.  Diarrhea.  Still with loose stools.  Uncertain etiology.  Patient is on linezolid .  Continue probiotic and rectal tube.  GI pathogen panel negative.  Will give 1 dose of loperamide  today.    OSA with right-sided heart failure in the setting of suspected pulmonary hypertension/obesity hypoventilation syndrome Continue BiPAP at night   AKI on CKD stage 4 Creatinine today at 2.6 from 2.6..  Usual baseline between 2.1-2.6.  On milrinone drip, midodrine  and IV diuretics.  Cardiology following.   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  Continue sliding scale insulin .  On IV steroids.  Will decrease the dose of steroids today. Was on Lantus  which was discontinued due to hypoglycemia and was briefly on D5 normal saline.  Latest POC glucose of 242.  Long-acting insulin  Semglee  10 units was started yesterday.  Will increase the dose to 20 units.  Dose of Lyrica  have been decreased to 75 mg twice a day.    Left breast cellulitis versus breast swelling secondary to dependent edema  Currently on linezolid  plan is to treat empirically for 7 days with IV diuresis.  Patient will complete the linezolid  tomorrow.  General Surgery recommending nursing place several folded towels under left breast to keep it elevated.   Intermittent confusion. Improved, communicative and oriented.  Likely secondary to medication/COPD.  Ammonia within normal limits.  Lyrica  dose was decreased to twice a  day.     chronic hypotension Currently on midodrine , milrinone and Lasix  drip.  Latest blood pressure of 112/65   Hyperlipidemia Continue Lipitor   Class III  obesity Body mass index is 52.26 kg/m (pended).  Bedbound due to previous history of lower extremity fracture.  Anxiety/depression - Continue Zoloft.     Concern for vaginal fistula CT abdomen and pelvis without any concern.  Fistula ruled out  DVT prophylaxis: heparin  injection 5,000 Units Start: 11/29/23 2200   Disposition: Home with home health likely in 2 to 3 days, pending clinical improvement  Status is: Inpatient Remains inpatient appropriate because: Pending clinical improvement.  IV diuresis, inotropes, cardiology following    Code Status:     Code Status: Full Code  Family Communication: Spoke with the patient's friend at bedside  Consultants: Cardiology and  general surgery  Procedures: Ultrasound of the left breast. Right upper extremity PICC line placement Rectal tube placement  Anti-infectives:  Zyvox   Anti-infectives (From admission, onward)    Start     Dose/Rate Route Frequency Ordered Stop   12/06/23 2100  ceFAZolin  (ANCEF ) IVPB 2g/100 mL premix        2 g 200 mL/hr over 30 Minutes Intravenous Every 12 hours 12/06/23 0934 12/07/23 1018   12/06/23 0200  ceFAZolin  (ANCEF ) IVPB 2g/100 mL premix  Status:  Discontinued        2 g 200 mL/hr over 30 Minutes Intravenous Every 8 hours 12/05/23 2323 12/06/23 0934   12/02/23 1200  linezolid  (ZYVOX ) tablet 600 mg        600 mg Oral Every 12 hours 12/02/23 1104 12/08/23 2359   11/30/23 1200  ceFAZolin  (ANCEF ) IVPB 2g/100 mL premix  Status:  Discontinued        2 g 200 mL/hr over 30 Minutes Intravenous Every 8 hours 11/30/23 1049 12/05/23 2323       Subjective:  Today, patient was seen and examined at bedside.  Patient still continues to have diarrhea on rectal tube.  Patient's friend at bedside.  Continues to feel better overall with leg  swelling and shortness of breath.  Objective: Vitals:   12/08/23 1100 12/08/23 1200  BP: (!) 98/47 112/65  Pulse: 96 96  Resp: 15 14  Temp:    SpO2: 94% 91%    Intake/Output Summary (Last 24 hours) at 12/08/2023 1328 Last data filed at 12/08/2023 1200 Gross per 24 hour  Intake 490.07 ml  Output 1700 ml  Net -1209.93 ml   Filed Weights   12/06/23 0500 12/07/23 0500 12/08/23 0500  Weight: 125.4 kg 130.4 kg 129.6 kg   Body mass index is 52.26 kg/m (pended).   Physical Exam:  General: Obese built, not in obvious distress, on nasal cannula oxygen  at 5 L/min HENT:   No scleral pallor or icterus noted. Oral mucosa is moist.  Chest: Diminished breath sounds bilaterally.   CVS: S1 &S2 heard. No murmur.  Regular rate and rhythm. Abdomen: Soft, nontender, obese luminal wall edema bowel sounds are heard.  Foley catheter in place Extremities: No cyanosis, clubbing lateral lower extremity edema peripheral pulses are palpable. Psych: Alert, awake and oriented, normal mood CNS:  No cranial nerve deficits.  Power equal in all  extremities.   Skin: Warm and dry.  No rashes noted.  Data Review: I have personally reviewed the following laboratory data and studies,  CBC: Recent Labs  Lab 12/03/23 0252 12/04/23 0603 12/05/23 0809 12/06/23 0500 12/07/23 0525 12/08/23 0532  WBC 10.0 11.1* 9.3 7.9 5.5 4.3  NEUTROABS 8.0* 9.0* 8.5* 7.1  --   --   HGB 11.0* 11.1* 11.0* 10.4* 10.4* 11.3*  HCT 42.3 42.6 40.3 39.4 37.8 39.9  MCV 83.6 82.9 80.0 80.1 78.6* 77.2*  PLT 280 292 311 277 259 282   Basic Metabolic Panel: Recent Labs  Lab 12/04/23 0603 12/05/23 0809 12/06/23 0500 12/07/23 0525 12/08/23 0532  NA 145 144 138 136 136  K 4.0 4.0 3.4* 3.4* 3.3*  CL 102 100 96* 92* 91*  CO2 30 29 28 30  32  GLUCOSE 72 200* 499* 392* 283*  BUN 28* 33* 35* 43* 51*  CREATININE 2.22* 2.52* 2.38* 2.60* 2.66*  CALCIUM  9.5 9.7 9.0 9.8 10.3  MG 1.7 2.4 2.0 2.1 2.1  PHOS  --   --   --  3.6 3.2    Liver Function Tests: Recent Labs  Lab 12/04/23 0603  AST 15  ALT <5  ALKPHOS 164*  BILITOT 0.4  PROT 6.9  ALBUMIN  3.4*   No results for input(s): LIPASE, AMYLASE in the last 168 hours. Recent Labs  Lab 12/03/23 1014  AMMONIA 27   Cardiac Enzymes: No results for input(s): CKTOTAL, CKMB, CKMBINDEX, TROPONINI in the last 168 hours. BNP (last 3 results) Recent Labs    01/13/23 1811 02/15/23 2105 03/27/23 1622  BNP 334.2* 432.5* 462.5*    ProBNP (last 3 results) Recent Labs    11/29/23 1531 11/30/23 0300  PROBNP 1,820.0* 1,624.0*    CBG: Recent Labs  Lab 12/07/23 1621 12/07/23 2145 12/08/23 0745 12/08/23 0928 12/08/23 1133  GLUCAP 286* 227* 274* 266* 242*   Recent Results (from the past 240 hours)  MRSA Next Gen by PCR, Nasal     Status: Abnormal   Collection Time: 11/29/23  7:06 PM   Specimen: Nasal Mucosa; Nasal Swab  Result Value Ref Range Status   MRSA by PCR Next Gen DETECTED (A) NOT DETECTED Final    Comment: (NOTE) The GeneXpert MRSA Assay (FDA approved for NASAL specimens only), is one component of a comprehensive MRSA colonization surveillance program. It is not intended to diagnose MRSA infection nor to guide or monitor treatment for MRSA infections. Test performance is not FDA approved in patients less than 94 years old. Performed at [facility], [address] [place], [place] [address]   Gastrointestinal Panel by PCR , Stool     Status: None   Collection Time: 12/07/23  1:12 PM   Specimen: Stool  Result Value Ref Range Status   Campylobacter species NOT DETECTED NOT DETECTED Final   Plesimonas shigelloides NOT DETECTED NOT DETECTED Final   Salmonella species NOT DETECTED NOT DETECTED Final   Yersinia enterocolitica NOT DETECTED NOT DETECTED Final   Vibrio species NOT DETECTED NOT DETECTED Final   Vibrio cholerae NOT DETECTED NOT DETECTED Final   Enteroaggregative E coli (EAEC) NOT DETECTED NOT  DETECTED Final   Enteropathogenic E coli (EPEC) NOT DETECTED NOT DETECTED Final   Enterotoxigenic E coli (ETEC) NOT DETECTED NOT DETECTED Final   Shiga like toxin producing E coli (STEC) NOT DETECTED NOT DETECTED Final   Shigella/Enteroinvasive E coli (EIEC) NOT DETECTED NOT DETECTED Final   Cryptosporidium NOT DETECTED NOT DETECTED Final  Cyclospora cayetanensis NOT DETECTED NOT DETECTED Final   Entamoeba histolytica NOT DETECTED NOT DETECTED Final   Giardia lamblia NOT DETECTED NOT DETECTED Final   Adenovirus F40/41 NOT DETECTED NOT DETECTED Final   Astrovirus NOT DETECTED NOT DETECTED Final   Norovirus GI/GII NOT DETECTED NOT DETECTED Final   Rotavirus A NOT DETECTED NOT DETECTED Final   Sapovirus (I, II, IV, and V) NOT DETECTED NOT DETECTED Final    Comment: Performed at [facility], [address] [place], [place] [address]     Studies: DG CHEST PORT 1 VIEW Result Date: 12/08/2023 CLINICAL DATA:  Follow-up probable CHF with pulmonary edema. EXAM: PORTABLE CHEST 1 VIEW COMPARISON:  12/03/2023 FINDINGS: The patient is currently rotated to the left with no gross change in enlargement of the cardiac silhouette. Increased density at the left lung base. Is difficult to determine how much of this is due to overlapping of the heart and soft tissues with the patient rotation. The remainder of the lungs are clear with resolved changes of interstitial pulmonary edema. Stable surgical absence of the distal right clavicle. IMPRESSION: 1. Resolved changes of congestive heart failure. 2. Increased density at the left lung base. This could be due to overlapping of the heart and soft tissues with the patient rotated to the left. Atelectasis or pneumonia cannot be excluded. Electronically Signed   By: Elspeth Bathe M.D.   On: 12/08/2023 11:57    Vernal Alstrom, MD  [facility] 12/08/2023  If 7PM-7AM, please contact night-coverage

## 2023-12-08 NOTE — Progress Notes (Addendum)
   12/08/23 0842  Oxygen  Therapy/Pulse Ox  O2 Device (S)  Nasal Cannula (Changed to a regular  @ 5 L.)  O2 Therapy Oxygen   O2 Flow Rate (L/min) 5 L/min  FiO2 (%) 40 %   Sp02: 93-94%.

## 2023-12-08 NOTE — Progress Notes (Signed)
 Progress Note  Patient Name: [name] [name] Date of Encounter: 12/08/2023 Primary Cardiologist: Jerel Balding, MD   Subjective   - 1.3 L.  She notes feel well but now dealing with loose stools.  Tachycardia has resolved for the first time this week.  Vital Signs    Vitals:   12/08/23 0355 12/08/23 0400 12/08/23 0500 12/08/23 0600  BP:  (!) 155/63 (!) 110/48 (!) 143/64  Pulse:  98 89 94  Resp:  17 18 16   Temp: 98.5 F (36.9 C)     TempSrc: Axillary     SpO2:  93% 96% 97%  Weight:   129.6 kg   Height:        Intake/Output Summary (Last 24 hours) at 12/08/2023 0752 Last data filed at 12/08/2023 0556 Gross per 24 hour  Intake 462.7 ml  Output 1700 ml  Net -1237.3 ml   Filed Weights   12/06/23 0500 12/07/23 0500 12/08/23 0500  Weight: 125.4 kg 130.4 kg 129.6 kg    Physical Exam   GEN: Morbid obesity.  Mentation has improved Neck: Thick neck Cardiac: Regular rhythm, no murmurs, rubs, or gallops but distant heart sounds   Respiratory: Decreased breath sounds bilaterally  GI: Soft, nontender, non-distended  MS: Bilateral non pitting edema; legs are warm   Labs   Telemetry:  SR to [facility]   Chemistry Recent Labs  Lab 12/04/23 0603 12/05/23 0809 12/06/23 0500 12/07/23 0525 12/08/23 0532  NA 145   < > 138 136 136  K 4.0   < > 3.4* 3.4* 3.3*  CL 102   < > 96* 92* 91*  CO2 30   < > 28 30 32  GLUCOSE 72   < > 499* 392* 283*  BUN 28*   < > 35* 43* 51*  CREATININE 2.22*   < > 2.38* 2.60* 2.66*  CALCIUM  9.5   < > 9.0 9.8 10.3  PROT 6.9  --   --   --   --   ALBUMIN  3.4*  --   --   --   --   AST 15  --   --   --   --   ALT <5  --   --   --   --   ALKPHOS 164*  --   --   --   --   BILITOT 0.4  --   --   --   --   GFRNONAA 23*   < > 21* 19* 19*  ANIONGAP 12   < > 15 14 12    < > = values in this interval not displayed.     Hematology Recent Labs  Lab 12/06/23 0500 12/07/23 0525 12/08/23 0532  WBC 7.9 5.5 4.3  RBC 4.92 4.81 5.17*  HGB 10.4* 10.4*  11.3*  HCT 39.4 37.8 39.9  MCV 80.1 78.6* 77.2*  MCH 21.1* 21.6* 21.9*  MCHC 26.4* 27.5* 28.3*  RDW 21.9* 21.6* 21.7*  PLT 277 259 282     Cardiac Studies   Cardiac Studies & Procedures   ______________________________________________________________________________________________     ECHOCARDIOGRAM  ECHOCARDIOGRAM COMPLETE 11/30/2023  Narrative ECHOCARDIOGRAM REPORT    Patient Name:   [name] [name] Date of Exam: 11/30/2023 Medical Rec #:  [id number]          Height:       62.5 in Accession #:    [id number]         Weight:       267.0 lb Date  of Birth:  04-10-1955          BSA:          2.174 m Patient Age:    68 years           BP:           135/81 mmHg Patient Gender: F                  HR:           87 bpm. Exam Location:  Inpatient  Procedure: 2D Echo, Cardiac Doppler, Color Doppler and Intracardiac Opacification Agent (Both Spectral and Color Flow Doppler were utilized during procedure).  Indications:    CHF-Acute Diastolic I50.31  History:        Patient has prior history of Echocardiogram examinations, most recent 03/28/2023. Risk Factors:Hypertension and Diabetes.  Sonographer:    Jayson Gaskins Referring Phys: [id number] MIR M [facility]   Sonographer Comments: Technically challenging study due to limited acoustic windows. No apicals due to pain. IMPRESSIONS   1. Left ventricular ejection fraction, by estimation, is 60 to 65%. The left ventricle has normal function. The left ventricle has no regional wall motion abnormalities. There is mild concentric left ventricular hypertrophy. Left ventricular diastolic function could not be evaluated. 2. Right ventricular systolic function is mildly reduced. The right ventricular size is mildly enlarged. 3. The mitral valve is normal in structure. No evidence of mitral valve regurgitation. No evidence of mitral stenosis. 4. The aortic valve was not well visualized. Aortic valve regurgitation is not visualized. No aortic  stenosis is present.  Conclusion(s)/Recommendation(s): Very limited echo due to poor sound wave transmission. Also tech unable to obtain apical images due to patient discomfort. RV not seen well but on available images seems mildly dialted and mildly hypokinetic. Consider repeat echo when patient able to toelrate apical imaging.  FINDINGS Left Ventricle: Left ventricular ejection fraction, by estimation, is 60 to 65%. The left ventricle has normal function. The left ventricle has no regional wall motion abnormalities. The left ventricular internal cavity size was normal in size. There is mild concentric left ventricular hypertrophy. Left ventricular diastolic function could not be evaluated.  Right Ventricle: The right ventricular size is mildly enlarged. No increase in right ventricular wall thickness. Right ventricular systolic function is mildly reduced.  Left Atrium: Left atrial size was normal in size.  Right Atrium: Right atrial size was normal in size.  Pericardium: There is no evidence of pericardial effusion.  Mitral Valve: The mitral valve is normal in structure. No evidence of mitral valve regurgitation. No evidence of mitral valve stenosis.  Tricuspid Valve: The tricuspid valve is not well visualized. Tricuspid valve regurgitation is trivial. No evidence of tricuspid stenosis.  Aortic Valve: The aortic valve was not well visualized. Aortic valve regurgitation is not visualized. No aortic stenosis is present.  Pulmonic Valve: The pulmonic valve was not well visualized. Pulmonic valve regurgitation is trivial. No evidence of pulmonic stenosis.  Aorta: The aortic root is normal in size and structure.  Venous: The inferior vena cava was not well visualized.  IAS/Shunts: No atrial level shunt detected by color flow Doppler.   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, Stage D, Hypervolemic - on 2.5 mcg Milrinone, high dose lasix  drip and metolazone; nephrology is also following, temporary CVVHD may be considered if unable to use medication therapy - High risk of SGLT2i related infection - discussed with shock team 12/04/23, no plans for RHC at this time (see prior notes) - though her care her significant other ([name] [name]), son, and family have been updated on her care   Left breast cellulitis- as per surgery/primary DM- Insulin  as per primary Intermittent Hypotension- not actively an issue, as per nephrology/primary Morbid obesity- complicated by hypoalbuminemia; is now able to eat off of constant BIPAP; encourage nutrition   For questions or updates, please contact [facility] Please consult [email or web address] for contact info under Cardiology/STEMI.      Stanly Leavens, MD F[facility] Cardiologist [facility]  [address] [place], [address] [place], [place] [address] [phone]  7:53 AM

## 2023-12-08 NOTE — Progress Notes (Signed)
 [facility] Progress Note  Subjective:  UOP 1300 yest, net neg 800 cc Creat stable at 2.6    Vitals:   12/08/23 1300 12/08/23 1352 12/08/23 1402 12/08/23 1446  BP: 127/62  (!) 157/78   Pulse: (!) 101  100   Resp: 20  18   Temp:    98.1 F (36.7 C)  TempSrc:    Oral  SpO2: 96% 93% 92%   Weight:      Height:        Exam: Gen alert, no distress, bedbound, morbidly obese Sclera anicteric, throat clear  No jvd or bruits Chest clear bilat to bases RRR no MRG Abd soft ntnd no mass or ascites +bs Ext 1+ diffuse bilat LE edema, has improved Neuro is alert, and awake, Ox 3  Chronic debilitated   Home bp meds: Demadex  40mg  qam No others     Date                             Creat               eGFR (ml/min) May 2016                    12.6 >> 1.69    AKI episode 2018                            1.49- 1.97 2020                            1.59- 1.88 Mar- aug 2021             1.37- 3.89 Sept-oct 2021              7.15 >> 1.36    AKI  Dec 2021                     0.83- 2.37 Jun 2022                     1.25- 2.55 July-dec 2022              1.38- 3.31 Mar- jul 2023               0.97- 3.80 Aug - oct 2023             2.13- 3.63 Aug - nov 2024   1.52- 2.96 Dec 2024- jan 2025     5.55 >> 2.13    8- 25 ml/min Feb 2025                     6.50 >> 2.68    6- 19 ml/mmin  10/08                           1.94 10/09                           1.98 10/10                           2.13 10/11                           1.99  10/12                           2.23 10/13                           2.22 10/14                           2.52    UA pending  CXR 10/08 -> + pulm edema  CXR 10/12 -> + pulm edema  Total I/O = 5.8 L in and 8.4 L out = net neg 2.6 L  Wt are down (throwing 1st wt out) a few kg    Assessment/ Plan: CKD 4: b/l creatinine 2.1- 2.6 from late 2024 to feb 2025, eGFR 19- 25 ml/min. Creat here was 1.9 on admission and was up to 2.5 at time of consult. Pt  admitted w/ acute /chronic diast HF + severe RV dysfunction. Seen by cardiology and IV lasix  was started then titrated up to 120 IV bid, and then changed to IV lasix  gtt which is ongoing now. Rise in creatinine is related to decomp CHF (RV failure+ diastolic) + IV diuresis, however the creatinine remains in her baseline range (Cr 2.1- 2.6). Pt is not a candidate for long-term dialysis due to severe comorbidities and bedbound state. Creat stable today. MS remains good, UOP 1300 yesterday, net neg 800cc. No need for CRRT at this time. Will follow.  Acute on chronic heart failure/ vol overload: w/ HFpEF and RV failure. Getting metolazone 5mg  po daily, midodrine  5mg  tid, IV lasix  20mg  /hr and milrinone infusion. Wts down to 129kg (135kg on admit).  Super morbid obesity Bedbound state COPD OHS/ OSA DM2         Myer Fret MD  [facility] 12/08/2023, 3:28 PM  Recent Labs  Lab 12/04/23 0603 12/05/23 0809 12/07/23 0525 12/08/23 0532  HGB 11.1*   < > 10.4* 11.3*  ALBUMIN  3.4*  --   --   --   CALCIUM  9.5   < > 9.8 10.3  PHOS  --   --  3.6 3.2  CREATININE 2.22*   < > 2.60* 2.66*  K 4.0   < > 3.4* 3.3*   < > = values in this interval not displayed.   No results for input(s): IRON, TIBC, FERRITIN in the last 168 hours. Inpatient medications:  acidophilus  2 capsule Oral TID   allopurinol   100 mg Oral QHS   alteplase  2 mg Intracatheter Once   arformoterol  15 mcg Nebulization BID   atorvastatin   10 mg Oral QHS   budesonide (PULMICORT) nebulizer solution  0.5 mg Nebulization BID   calcitRIOL  0.25 mcg Oral Q M,W,F   Chlorhexidine  Gluconate Cloth  6 each Topical Daily   fluticasone  2 spray Each Nare Daily   heparin   5,000 Units Subcutaneous Q8H   insulin  aspart  0-20 Units Subcutaneous TID WC   insulin  aspart  0-5 Units Subcutaneous QHS   insulin  glargine-yfgn  20 Units Subcutaneous Daily   ipratropium-albuterol   3 mL Nebulization TID   linezolid   600 mg Oral Q12H   loratadine   10 mg  Oral Daily   methylPREDNISolone  (SOLU-MEDROL ) injection  60 mg Intravenous Q24H   metolazone  5 mg Oral Daily   midodrine   5 mg Oral TID WC   nystatin  cream  1 Application Topical BID  oxyCODONE   10 mg Oral BID   pantoprazole   40 mg Oral QAC breakfast   polyethylene glycol  17 g Oral Daily   potassium chloride   40 mEq Oral Daily   pregabalin   75 mg Oral Daily   rOPINIRole  0.25 mg Oral Once   sertraline  50 mg Oral q AM   sodium chloride  flush  10-40 mL Intracatheter Q12H   triamcinolone  1 Application Topical Daily    furosemide  (LASIX ) 200 mg in dextrose  5 % 100 mL (2 mg/mL) infusion 20 mg/hr (12/08/23 1400)   milrinone 0.25 mcg/kg/min (12/08/23 1400)   acetaminophen  **OR** acetaminophen , albuterol , camphor-menthol , hydrALAZINE , hydrOXYzine , methocarbamol, naphazoline-pheniramine, nystatin , ondansetron  **OR** ondansetron  (ZOFRAN ) IV, mouth rinse, oxyCODONE , sodium chloride  flush, traZODone 

## 2023-12-09 DIAGNOSIS — I50813 Acute on chronic right heart failure: Secondary | ICD-10-CM | POA: Diagnosis not present

## 2023-12-09 DIAGNOSIS — J9601 Acute respiratory failure with hypoxia: Secondary | ICD-10-CM | POA: Diagnosis not present

## 2023-12-09 LAB — COMPREHENSIVE METABOLIC PANEL WITH GFR
ALT: <5 U/L (ref 0–44)
AST: <10 U/L — ABNORMAL LOW (ref 15–41)
Albumin: 3.6 g/dL (ref 3.5–5.0)
Alkaline Phosphatase: 139 U/L — ABNORMAL HIGH (ref 38–126)
Anion gap: 11 (ref 5–15)
BUN: 64 mg/dL — ABNORMAL HIGH (ref 8–23)
CO2: 32 mmol/L (ref 22–32)
Calcium: 9.8 mg/dL (ref 8.9–10.3)
Chloride: 89 mmol/L — ABNORMAL LOW (ref 98–111)
Creatinine, Ser: 2.79 mg/dL — ABNORMAL HIGH (ref 0.44–1.00)
GFR, Estimated: 18 mL/min — ABNORMAL LOW (ref >60)
Glucose, Bld: 276 mg/dL — ABNORMAL HIGH (ref 70–99)
Potassium: 3.8 mmol/L (ref 3.5–5.1)
Sodium: 132 mmol/L — ABNORMAL LOW (ref 135–145)
Total Bilirubin: 0.5 mg/dL (ref 0.0–1.2)
Total Protein: 6.6 g/dL (ref 6.5–8.1)

## 2023-12-09 LAB — CBC WITH DIFFERENTIAL/PLATELET
Abs Immature Granulocytes: 0.02 K/uL (ref 0.00–0.07)
Basophils Absolute: 0 K/uL (ref 0.0–0.1)
Basophils Relative: 0 %
Eosinophils Absolute: 0 K/uL (ref 0.0–0.5)
Eosinophils Relative: 0 %
HCT: 38 % (ref 36.0–46.0)
Hemoglobin: 10.5 g/dL — ABNORMAL LOW (ref 12.0–15.0)
Immature Granulocytes: 0 %
Lymphocytes Relative: 4 %
Lymphs Abs: 0.3 K/uL — ABNORMAL LOW (ref 0.7–4.0)
MCH: 21 pg — ABNORMAL LOW (ref 26.0–34.0)
MCHC: 27.6 g/dL — ABNORMAL LOW (ref 30.0–36.0)
MCV: 76 fL — ABNORMAL LOW (ref 80.0–100.0)
Monocytes Absolute: 0.3 K/uL (ref 0.1–1.0)
Monocytes Relative: 4 %
Neutro Abs: 7.3 K/uL (ref 1.7–7.7)
Neutrophils Relative %: 92 %
Platelets: 229 K/uL (ref 150–400)
RBC: 5 MIL/uL (ref 3.87–5.11)
RDW: 21.3 % — ABNORMAL HIGH (ref 11.5–15.5)
Smear Review: NORMAL
WBC: 8 K/uL (ref 4.0–10.5)
nRBC: 0 % (ref 0.0–0.2)

## 2023-12-09 LAB — GLUCOSE, CAPILLARY
Glucose-Capillary: 245 mg/dL — ABNORMAL HIGH (ref 70–99)
Glucose-Capillary: 223 mg/dL — ABNORMAL HIGH (ref 70–99)
Glucose-Capillary: 268 mg/dL — ABNORMAL HIGH (ref 70–99)
Glucose-Capillary: 314 mg/dL — ABNORMAL HIGH (ref 70–99)
Glucose-Capillary: 258 mg/dL — ABNORMAL HIGH (ref 70–99)

## 2023-12-09 LAB — MAGNESIUM: Magnesium: 1.9 mg/dL (ref 1.7–2.4)

## 2023-12-09 LAB — COOXEMETRY PANEL
Carboxyhemoglobin: 2.3 % — ABNORMAL HIGH (ref 0.5–1.5)
Methemoglobin: 1 % (ref 0.0–1.5)
O2 Saturation: 83.4 %
Total hemoglobin: 10.6 g/dL — ABNORMAL LOW (ref 12.0–16.0)

## 2023-12-09 MED ORDER — INSULIN GLARGINE-YFGN 100 UNIT/ML ~~LOC~~ SOLN
25.0000 [IU] | Freq: Every day | SUBCUTANEOUS | Status: DC
  Administered 2023-12-10 – 2023-12-11 (×2): 25 [IU] via SUBCUTANEOUS
  Filled 2023-12-09 (×2): qty 0.25

## 2023-12-09 MED ORDER — IPRATROPIUM-ALBUTEROL 0.5-2.5 (3) MG/3ML IN SOLN
3.0000 mL | Freq: Two times a day (BID) | RESPIRATORY_TRACT | Status: DC
  Administered 2023-12-09 – 2023-12-27 (×34): 3 mL via RESPIRATORY_TRACT
  Filled 2023-12-09 (×37): qty 3

## 2023-12-09 MED ORDER — DICLOFENAC SODIUM 1 % EX GEL
2.0000 g | Freq: Four times a day (QID) | CUTANEOUS | Status: DC
  Administered 2023-12-09 – 2023-12-28 (×53): 2 g via TOPICAL
  Filled 2023-12-09 (×3): qty 100

## 2023-12-09 NOTE — Progress Notes (Signed)
   12/09/23 0042  BiPAP/CPAP/SIPAP  $ Non-Invasive Home Ventilator  Subsequent  BiPAP/CPAP/SIPAP Pt Type Adult  BiPAP/CPAP/SIPAP V60  Mask Type Full face mask  Dentures removed? Not applicable  Mask Size Medium  Set Rate 16 breaths/min  Respiratory Rate 20 breaths/min  IPAP 20 cmH20  EPAP 10 cmH2O  PEEP 10 cmH20  FiO2 (%) 40 %  Minute Ventilation 13.2  Leak 12  Peak Inspiratory Pressure (PIP) 21  Tidal Volume (Vt) 491  Patient Home Machine No  Patient Home Mask No  Patient Home Tubing No  Auto Titrate No  Press High Alarm 35 cmH2O  Press Low Alarm 5 cmH2O  CPAP/SIPAP surface wiped down Yes  Device Plugged into RED Power Outlet Yes  BiPAP/CPAP /SiPAP Vitals  Pulse Rate (!) 109  Resp 20  SpO2 94 %  MEWS Score/Color  MEWS Score 1  MEWS Score Color Green

## 2023-12-09 NOTE — Plan of Care (Signed)
  Problem: Coping: Goal: Ability to adjust to condition or change in health will improve Outcome: Progressing   Problem: Fluid Volume: Goal: Ability to maintain a balanced intake and output will improve Outcome: Progressing   Problem: Metabolic: Goal: Ability to maintain appropriate glucose levels will improve Outcome: Progressing   Problem: Nutritional: Goal: Maintenance of adequate nutrition will improve Outcome: Progressing   Problem: Skin Integrity: Goal: Risk for impaired skin integrity will decrease Outcome: Progressing   Problem: Tissue Perfusion: Goal: Adequacy of tissue perfusion will improve Outcome: Progressing   Problem: Clinical Measurements: Goal: Ability to maintain clinical measurements within normal limits will improve Outcome: Progressing
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
## 2023-12-09 NOTE — Progress Notes (Signed)
 [facility] Progress Note  Subjective:  Net neg 1.0 L yesterday Creat 2.79 today    Vitals:   12/09/23 1200 12/09/23 1258 12/09/23 1300 12/09/23 1400  BP: (!) 140/52  (!) 111/49 (!) 116/43  Pulse: (!) 107  (!) 101 (!) 108  Resp: (!) 21  16 19   Temp:  98.4 F (36.9 C)    TempSrc:  Oral    SpO2: (!) 84%  (!) 86% 93%  Weight:      Height:        Exam: Gen alert, no distress, bedbound, morbidly obese Sclera anicteric, throat clear  No jvd or bruits Chest clear bilat to bases RRR no MRG Abd soft ntnd no mass or ascites +bs Ext 1+ diffuse bilat LE edema, has improved Neuro is alert, and awake, Ox 3  Chronic debilitated   Home bp meds: Demadex  40mg  qam No others     Date                             Creat               eGFR (ml/min) May 2016                    12.6 >> 1.69    AKI episode 2018                            1.49- 1.97 2020                            1.59- 1.88 Mar- aug 2021             1.37- 3.89 Sept-oct 2021              7.15 >> 1.36    AKI  Dec 2021                     0.83- 2.37 Jun 2022                     1.25- 2.55 July-dec 2022              1.38- 3.31 Mar- jul 2023               0.97- 3.80 Aug - oct 2023             2.13- 3.63 Aug - nov 2024   1.52- 2.96 Dec 2024- jan 2025     5.55 >> 2.13    8- 25 ml/min Feb 2025                     6.50 >> 2.68    6- 19 ml/mmin  10/08                           1.94 10/09                           1.98 10/10                           2.13 10/11  1.99 10/12                           2.23 10/13                           2.22 10/14                           2.52    UA pending  CXR 10/08 -> + pulm edema  CXR 10/12 -> + pulm edema  Total I/O = 5.8 L in and 8.4 L out = net neg 2.6 L  Wt are down (throwing 1st wt out) a few kg    Assessment/ Plan: CKD 4: b/l creatinine 2.1- 2.6 from late 2024 to feb 2025, eGFR 19- 25 ml/min. Creat here was 1.9 on admission and was up to  2.5 at time of consult. Pt admitted w/ acute /chronic diast HF + severe RV dysfunction. Seen by cardiology and IV lasix  was started then titrated up to 120 IV bid, and then changed to IV lasix  gtt which is ongoing now. Rise in creatinine is related to decomp CHF (RV failure+ diastolic) + IV diuresis, however the creatinine remains in her baseline range (Cr 2.1- 2.6). Pt is not a candidate for long-term dialysis due to severe comorbidities and bedbound state. Creat stable today. MS remains good, UOP 1625 yesterday, net neg 1.0 L. No need for CRRT at this time. No further suggestions, will sign off.  Acute on chronic heart failure/ vol overload: w/ HFpEF and RV failure. Getting metolazone 5mg  po daily, midodrine  5mg  tid, IV lasix  20mg  /hr and milrinone infusion. Wts down to 129kg (135kg on admit).  Super morbid obesity Bedbound state COPD OHS/ OSA DM2         Myer Fret MD  [facility] 12/09/2023, 3:51 PM  Recent Labs  Lab 12/04/23 0603 12/05/23 0809 12/07/23 0525 12/08/23 0532 12/09/23 1234  HGB 11.1*   < > 10.4* 11.3* 10.5*  ALBUMIN  3.4*  --   --   --  3.6  CALCIUM  9.5   < > 9.8 10.3 9.8  PHOS  --   --  3.6 3.2  --   CREATININE 2.22*   < > 2.60* 2.66* 2.79*  K 4.0   < > 3.4* 3.3* 3.8   < > = values in this interval not displayed.   No results for input(s): IRON, TIBC, FERRITIN in the last 168 hours. Inpatient medications:  acidophilus  2 capsule Oral TID   allopurinol   100 mg Oral QHS   alteplase  2 mg Intracatheter Once   arformoterol  15 mcg Nebulization BID   atorvastatin   10 mg Oral QHS   budesonide (PULMICORT) nebulizer solution  0.5 mg Nebulization BID   calcitRIOL  0.25 mcg Oral Q M,W,F   Chlorhexidine  Gluconate Cloth  6 each Topical Daily   diclofenac Sodium  2 g Topical QID   fluticasone  2 spray Each Nare Daily   heparin   5,000 Units Subcutaneous Q8H   insulin  aspart  0-20 Units Subcutaneous TID WC   insulin  aspart  0-5 Units Subcutaneous QHS   [START ON  12/10/2023] insulin  glargine-yfgn  25 Units Subcutaneous Daily   ipratropium-albuterol   3 mL Nebulization BID   loratadine   10 mg Oral Daily   methylPREDNISolone  (SOLU-MEDROL ) injection  60 mg Intravenous Q24H   metolazone  5 mg Oral  Daily   midodrine   5 mg Oral TID WC   nystatin  cream  1 Application Topical BID   oxyCODONE   10 mg Oral BID   pantoprazole   40 mg Oral QAC breakfast   polyethylene glycol  17 g Oral Daily   potassium chloride   40 mEq Oral Daily   pregabalin   75 mg Oral Daily   sertraline  50 mg Oral q AM   sodium chloride  flush  10-40 mL Intracatheter Q12H   triamcinolone  1 Application Topical Daily    furosemide  (LASIX ) 200 mg in dextrose  5 % 100 mL (2 mg/mL) infusion 20 mg/hr (12/09/23 1300)   milrinone 0.25 mcg/kg/min (12/09/23 1300)   acetaminophen  **OR** acetaminophen , albuterol , camphor-menthol , hydrALAZINE , hydrOXYzine , methocarbamol, naphazoline-pheniramine, nystatin , ondansetron  **OR** ondansetron  (ZOFRAN ) IV, mouth rinse, oxyCODONE , sodium chloride  flush, traZODone 

## 2023-12-09 NOTE — Progress Notes (Signed)
 [facility] Progress Note Patient: [name] [name] FMW:[id number] DOB: April 03, 1955  DOA: 11/29/2023 DOS: the patient was seen and examined on 12/09/2023  Brief Hospital Course: [name] [name] is a 68 y.o. female with medical history significant for insulin -dependent type 2 diabetes, chronic heart failure with preserved EF, morbid obesity, bedbound, hypertension being admitted to the hospital with weight gain and peripheral edema due to suspected acute exacerbation of chronic heart failure with preserved EF.  Cardiology consulted.  Patient also with concerns for left breast cellulitis.  General surgery consulted.  Patient subsequently placed on a Lasix  drip as well as milrinone for diuresis per cardiology and PICC line placed.  At this time cardiology following.  Still on diuresis and inotropes.   Assessment and plan. Acute on chronic heart failure with preserved EF. Uses 5 LPM at baseline.  Presents with shortness of breath. Echocardiogram showed EF 6065%.  No valvular abnormality. Received IV Lasix  and metolazone initially. Cardiology was consulted Patient with minimal urine output despite aggressive diuresis and now on Primacor drip and Lasix  drip. Per cardiology, patient is not a candidate for advanced heart failure treatment and RHC would not add value to her care. Nephrology was also consulted for volume management but no need for CRRT.  Off of BiPAP. Remains negative balance over last 24 hours. For now plan is continuing diuresis with IV Lasix  drip, Zaroxolyn, Primacor and midodrine .   Acute hypoxic respiratory failure secondary to hypoxia as well as hypercarbia. Respiratory acidosis. Acute COPD exacerbation secondary pneumonia Initial ABG showed pCO2 of 73.  Improving with BiPAP nightly. On Solu-Medrol  Pulmicort Brovana and DuoNeb, Claritin  PPI and BiPAP as needed nightly. Treated with Zyvox  and cefazolin .   Hypokalemia.   Replaced.   Diarrhea.  Resolved. Uncertain  etiology.  Continue probiotic and rectal tube. GI pathogen panel negative.  OSA with right-sided heart failure in the setting of suspected pulmonary hypertension/obesity hypoventilation syndrome Continue BiPAP at night   AKI on CKD stage 4 Baseline creatinine around 2.1. Upon admission serum creatinine was 1.9.  Current serum creatinine trending up. Multifactorial etiology. Nephrology was consulted.  No CRRT at this time.  Per nephrology patient not a candidate for long-term hemodialysis.  Nephrology signed off 10/18. On milrinone drip, midodrine  and IV diuretics.   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 Continue sliding scale insulin .  Basal insulin  rate increased.  Diet changed to carb modified diet as well. Hyperglycemia secondary to IV steroids.  Diabetic neuropathy. On Lyrica .  Dose adjusted for renal dysfunction and mentation.   Left breast cellulitis versus breast swelling secondary to dependent edema Currently on linezolid , completed 7-day treatment course. General Surgery was consulted, recommended nursing place several folded towels under left breast to keep it elevated.   Toxic metabolic encephalopathy secondary to hypoxia, hypercarbia as well as Lyrica . Mentation improving after adjusting Lyrica  dose and BiPAP therapy. Improved, communicative and oriented.   Chronic hypotension Currently on midodrine , milrinone and Lasix  drip.    Hyperlipidemia Continue Lipitor   Obesity Class 3 Body mass index is 52.46 kg/m (pended).  Placing the pt at higher risk of poor outcomes.  Bedbound  due to previous history of lower extremity fracture.   Anxiety/depression Continue Zoloft.     Concern for vaginal fistula-ruled out Bedside RN noted stool coming out from vagina confirmed by the patient as well.  CT abdomen and pelvis without contrast without any evidence of vaginal fistula. Shows evidence of  pneumonia.  Lower extremity edema.  Doppler ordered to rule out DVT.  Patient chronically bedbound.  Subjective: Feeling better.  No nausea no vomiting reports burning pain on her legs.  No diarrhea.  Has some cough.  Physical Exam: Basal crackles. S1-S2 present Bowel sound present.  Nontender. Lower extremity edema seen.  No warmth or erythema. No asterixis.  No new focal deficit.  Data Reviewed: I have Reviewed nursing notes, Vitals, and Lab results. Since last encounter, pertinent lab results CBC and BMP   . I have ordered test including CBC and BMP  . I have ordered imaging lower extremity Doppler  .   Disposition: Status is: Inpatient Remains inpatient appropriate because: Monitor for improvement in renal function  heparin  injection 5,000 Units Start: 11/29/23 2200   Family Communication: No one at bedside Level of care: ICU   Vitals:   12/09/23 1300 12/09/23 1400 12/09/23 1500 12/09/23 1600  BP: (!) 111/49 (!) 116/43 (!) 98/38 (!) 119/56  Pulse: (!) 101 (!) 108 (!) 112 (!) 102  Resp: 16 19 18 14   Temp:      TempSrc:      SpO2: (!) 86% 93% 91% 93%  Weight:      Height:         Author: Yetta Blanch, MD 12/09/2023 4:16 PM  Please look on [email or web address] to find out who is on call.

## 2023-12-09 NOTE — Progress Notes (Signed)
   12/09/23 2345  BiPAP/CPAP/SIPAP  BiPAP/CPAP/SIPAP Pt Type Adult  BiPAP/CPAP/SIPAP V60  Mask Type Full face mask  Dentures removed? Not applicable  Mask Size Medium  Set Rate 16 breaths/min  Respiratory Rate 19 breaths/min  IPAP 20 cmH20  EPAP 10 cmH2O  FiO2 (%) 40 %  Minute Ventilation 11.4  Leak 21  Peak Inspiratory Pressure (PIP) 21  Tidal Volume (Vt) 532  Patient Home Machine No  Patient Home Mask No  Patient Home Tubing No  Auto Titrate No  Press High Alarm 30 cmH2O  Press Low Alarm 5 cmH2O  CPAP/SIPAP surface wiped down Yes  Device Plugged into RED Power Outlet Yes  BiPAP/CPAP /SiPAP Vitals  Resp 19  SpO2 94 %  Bilateral Breath Sounds Diminished;Rhonchi  MEWS Score/Color  MEWS Score 1  MEWS Score Color Green

## 2023-12-10 ENCOUNTER — Inpatient Hospital Stay (HOSPITAL_COMMUNITY)

## 2023-12-10 DIAGNOSIS — J9601 Acute respiratory failure with hypoxia: Secondary | ICD-10-CM | POA: Diagnosis not present

## 2023-12-10 DIAGNOSIS — I50813 Acute on chronic right heart failure: Secondary | ICD-10-CM | POA: Diagnosis not present

## 2023-12-10 DIAGNOSIS — R609 Edema, unspecified: Secondary | ICD-10-CM

## 2023-12-10 LAB — COOXEMETRY PANEL
Carboxyhemoglobin: 2.4 % — ABNORMAL HIGH (ref 0.5–1.5)
Methemoglobin: 0.8 % (ref 0.0–1.5)
O2 Saturation: 79 %
Total hemoglobin: 11.3 g/dL — ABNORMAL LOW (ref 12.0–16.0)

## 2023-12-10 LAB — RENAL FUNCTION PANEL
Albumin: 3.6 g/dL (ref 3.5–5.0)
Anion gap: 10 (ref 5–15)
BUN: 69 mg/dL — ABNORMAL HIGH (ref 8–23)
CO2: 33 mmol/L — ABNORMAL HIGH (ref 22–32)
Calcium: 9.7 mg/dL (ref 8.9–10.3)
Chloride: 89 mmol/L — ABNORMAL LOW (ref 98–111)
Creatinine, Ser: 3.2 mg/dL — ABNORMAL HIGH (ref 0.44–1.00)
GFR, Estimated: 15 mL/min — ABNORMAL LOW (ref >60)
Glucose, Bld: 343 mg/dL — ABNORMAL HIGH (ref 70–99)
Phosphorus: 3.6 mg/dL (ref 2.5–4.6)
Potassium: 3.8 mmol/L (ref 3.5–5.1)
Sodium: 133 mmol/L — ABNORMAL LOW (ref 135–145)

## 2023-12-10 LAB — CBC
HCT: 35.9 % — ABNORMAL LOW (ref 36.0–46.0)
Hemoglobin: 10.1 g/dL — ABNORMAL LOW (ref 12.0–15.0)
MCH: 21.3 pg — ABNORMAL LOW (ref 26.0–34.0)
MCHC: 28.1 g/dL — ABNORMAL LOW (ref 30.0–36.0)
MCV: 75.6 fL — ABNORMAL LOW (ref 80.0–100.0)
Platelets: 199 K/uL (ref 150–400)
RBC: 4.75 MIL/uL (ref 3.87–5.11)
RDW: 20.8 % — ABNORMAL HIGH (ref 11.5–15.5)
WBC: 7 K/uL (ref 4.0–10.5)
nRBC: 0 % (ref 0.0–0.2)

## 2023-12-10 LAB — GLUCOSE, CAPILLARY
Glucose-Capillary: 191 mg/dL — ABNORMAL HIGH (ref 70–99)
Glucose-Capillary: 303 mg/dL — ABNORMAL HIGH (ref 70–99)
Glucose-Capillary: 290 mg/dL — ABNORMAL HIGH (ref 70–99)
Glucose-Capillary: 264 mg/dL — ABNORMAL HIGH (ref 70–99)
Glucose-Capillary: 279 mg/dL — ABNORMAL HIGH (ref 70–99)

## 2023-12-10 LAB — MAGNESIUM: Magnesium: 2.1 mg/dL (ref 1.7–2.4)

## 2023-12-10 MED ORDER — FUROSEMIDE 10 MG/ML IJ SOLN
80.0000 mg | Freq: Two times a day (BID) | INTRAMUSCULAR | Status: DC
  Administered 2023-12-10 – 2023-12-13 (×7): 80 mg via INTRAVENOUS
  Filled 2023-12-10 (×7): qty 8

## 2023-12-10 NOTE — Progress Notes (Signed)
   12/10/23 0333  BiPAP/CPAP/SIPAP  BiPAP/CPAP/SIPAP Pt Type Adult  BiPAP/CPAP/SIPAP V60  Mask Type Full face mask  Dentures removed? Not applicable  Mask Size Medium  Set Rate 16 breaths/min  Respiratory Rate 20 breaths/min  IPAP 20 cmH20  EPAP 10 cmH2O  FiO2 (%) 40 %  Minute Ventilation 10.2  Leak 7  Peak Inspiratory Pressure (PIP) 20  Tidal Volume (Vt) 481  Patient Home Machine No  Patient Home Mask No  Patient Home Tubing No  Auto Titrate No  Press High Alarm 30 cmH2O  Press Low Alarm 5 cmH2O  Device Plugged into RED Power Outlet Yes  BiPAP/CPAP /SiPAP Vitals  Resp 20  SpO2 96 %  Bilateral Breath Sounds Clear;Diminished  MEWS Score/Color  MEWS Score 0  MEWS Score Color Green

## 2023-12-10 NOTE — Plan of Care (Signed)
  Problem: Fluid Volume: Goal: Ability to maintain a balanced intake and output will improve Outcome: Progressing   Problem: Nutritional: Goal: Maintenance of adequate nutrition will improve Outcome: Progressing   Problem: Tissue Perfusion: Goal: Adequacy of tissue perfusion will improve Outcome: Progressing   Problem: Safety: Goal: Ability to remain free from injury will improve Outcome: Progressing
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
## 2023-12-10 NOTE — Progress Notes (Signed)
   12/10/23 2325  BiPAP/CPAP/SIPAP  BiPAP/CPAP/SIPAP Pt Type Adult  BiPAP/CPAP/SIPAP V60  Mask Type Full face mask  Dentures removed? Not applicable  Mask Size Medium  Set Rate 16 breaths/min  Respiratory Rate 20 breaths/min  IPAP 20 cmH20  EPAP 10 cmH2O  FiO2 (%) 40 %  Minute Ventilation 5.1  Leak 0  Peak Inspiratory Pressure (PIP) 20  Tidal Volume (Vt) 327  Patient Home Machine No  Patient Home Mask No  Patient Home Tubing No  Auto Titrate No  Press High Alarm 30 cmH2O  Press Low Alarm 5 cmH2O  Device Plugged into RED Power Outlet Yes

## 2023-12-10 NOTE — Progress Notes (Signed)
 PROGRESS NOTE    [name] [name]  FMW:[id number] DOB: Feb 17, 1956 DOA: 11/29/2023 PCP: [facility]    Brief Narrative:  [name] [name] is a 68 y.o. female with medical history significant for insulin -dependent type 2 diabetes, chronic heart failure with preserved EF, morbid obesity, bedbound, hypertension being admitted to the hospital with weight gain and peripheral edema due to suspected acute exacerbation of chronic heart failure with preserved EF.  Cardiology consulted.  Patient also with concerns for left breast cellulitis.  General surgery consulted.  Patient subsequently placed on a Lasix  drip as well as milrinone for diuresis per cardiology and PICC line placed.  At this time cardiology following.  Still on diuresis and inotropes.   Subjective: Patient seen and examined.  Her best friend is at the bedside.  Patient tells me that she feels 100% better and almost feels back to herself.  Patient does have BiPAP and oxygen  at home.  She lives at home and has caretaker support.  Appetite has improved.  Leg swelling has improved.   Assessment & Plan:   Acute on chronic heart failure with preserved EF. Uses 4 L oxygen  at home.  Uses BiPAP at night.  Presented with shortness of breath. Echocardiogram showed EF 6065%.  No valvular abnormality. Received IV Lasix  and metolazone initially , did not respond and was started on milrinone and Lasix  infusion. Cardiology following. Clinically improving.  Milrinone to taper off today.  Back on IV Lasix  80 mg twice daily today. Per cardiology, patient is not a candidate for advanced heart failure treatment and RHC would not add value to her care. Nephrology was also consulted for volume management but no need for CRRT.  Intermittently on BiPAP. -6 L since admission Urine output 1500 mL last 24 hours. Clinically improving.  Acute hypoxic respiratory failure secondary to hypoxia as well as hypercarbia. Respiratory acidosis. Acute  COPD exacerbation secondary pneumonia Initial ABG showed pCO2 of 73.  Improving with BiPAP nightly. On Solu-Medrol  Pulmicort Brovana and DuoNeb, Claritin  PPI and BiPAP as needed nightly. Treated with Zyvox  and cefazolin .   Hypokalemia.   Replaced and adequate.    Diarrhea.  Resolved. Uncertain etiology.  Continue probiotic.  Discontinue rectal tube. GI pathogen panel negative.   OSA with right-sided heart failure in the setting of suspected pulmonary hypertension/obesity hypoventilation syndrome Continue BiPAP at night   AKI on CKD stage 4 Baseline creatinine around 2.1. Upon admission serum creatinine was 1.9.  Current serum creatinine trending up- 3.2 today.  Multifactorial etiology. Nephrology was consulted.  No CRRT at this time.  Per nephrology patient not a candidate for long-term hemodialysis.  Nephrology signed off 10/18. On milrinone drip, midodrine  and IV diuretics.  Monitor closely.   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 Continue sliding scale insulin .  Basal insulin  rate increased.  Diet changed to carb modified diet as well. Hyperglycemia secondary to IV steroids.   Diabetic neuropathy. On Lyrica .  Dose adjusted for renal dysfunction and mentation.   Left breast cellulitis versus breast swelling secondary to dependent edema Currently on linezolid , completed 7-day treatment course. General Surgery was consulted, recommended nursing place several folded towels under left breast to keep it elevated.   Toxic metabolic encephalopathy secondary to hypoxia, hypercarbia as well as Lyrica . Mentation improving after adjusting Lyrica  dose and BiPAP therapy. Improved, communicative and oriented.   Chronic hypotension Currently on midodrine , milrinone and Lasix  drip.    Hyperlipidemia Continue Lipitor   Obesity Class 3  Body mass index is 52.46 kg/m (pended).  Placing the pt at higher risk of poor outcomes.    Bedbound  due to previous history of lower extremity fracture.   Anxiety/depression Continue Zoloft.     Concern for vaginal fistula-ruled out Bedside RN noted stool coming out from vagina confirmed by the patient as well.  CT abdomen and pelvis without contrast without any evidence of vaginal fistula. Shows evidence of pneumonia.   Lower extremity edema. Doppler pending to rule out DVT. Patient chronically bedbound.    DVT prophylaxis: heparin  injection 5,000 Units Start: 11/29/23 2200   Code Status: Full code Family Communication: Best friend at the bedside Disposition Plan: Status is: Inpatient Remains inpatient appropriate because: IV inotropes     Consultants:  Cardiology  Procedures:  None  Antimicrobials:  Completed antibiotics     Objective: Vitals:   12/10/23 0500 12/10/23 0600 12/10/23 0825 12/10/23 0830  BP: (!) 119/56 (!) 126/57    Pulse: 86 89    Resp: 17 17  17   Temp:   98.1 F (36.7 C)   TempSrc:   Axillary   SpO2: 98% 97%    Weight: 128.1 kg     Height:        Intake/Output Summary (Last 24 hours) at 12/10/2023 1135 Last data filed at 12/10/2023 1106 Gross per 24 hour  Intake 465.06 ml  Output 2975 ml  Net -2509.94 ml   Filed Weights   12/08/23 0500 12/09/23 0500 12/10/23 0500  Weight: 129.6 kg 130.1 kg 128.1 kg    Examination:  General: Morbidly obese.  Chronically sick looking.  Not in any distress.  Looks pleasant and interactive.  Alert awake and oriented.  Grossly weak on lower extremities. Cardiovascular: S1-S2 normal.  Regular rate rhythm. Respiratory: Bilateral clear.  No added sounds.  On 4 L oxygen . Gastrointestinal: Soft and nontender.  Obese and pendulous.  Bowel sound present. Ext: 1+ bilateral edema.  Nonpitting edema.  Chronic venous stasis changes. Neuro: Intact.  Gross generalized weakness. Musculoskeletal:     Data Reviewed: I have personally reviewed following labs and imaging studies  CBC: Recent Labs   Lab 12/04/23 0603 12/05/23 0809 12/06/23 0500 12/07/23 0525 12/08/23 0532 12/09/23 1234 12/10/23 0445  WBC 11.1* 9.3 7.9 5.5 4.3 8.0 7.0  NEUTROABS 9.0* 8.5* 7.1  --   --  7.3  --   HGB 11.1* 11.0* 10.4* 10.4* 11.3* 10.5* 10.1*  HCT 42.6 40.3 39.4 37.8 39.9 38.0 35.9*  MCV 82.9 80.0 80.1 78.6* 77.2* 76.0* 75.6*  PLT 292 311 277 259 282 229 199   Basic Metabolic Panel: Recent Labs  Lab 12/06/23 0500 12/07/23 0525 12/08/23 0532 12/09/23 1234 12/10/23 0445  NA 138 136 136 132* 133*  K 3.4* 3.4* 3.3* 3.8 3.8  CL 96* 92* 91* 89* 89*  CO2 28 30 32 32 33*  GLUCOSE 499* 392* 283* 276* 343*  BUN 35* 43* 51* 64* 69*  CREATININE 2.38* 2.60* 2.66* 2.79* 3.20*  CALCIUM  9.0 9.8 10.3 9.8 9.7  MG 2.0 2.1 2.1 1.9 2.1  PHOS  --  3.6 3.2  --  3.6   GFR: Estimated Creatinine Clearance: 21.2 mL/min (A) (by C-G formula based on SCr of 3.2 mg/dL (H)). Liver Function Tests: Recent Labs  Lab 12/04/23 0603 12/09/23 1234 12/10/23 0445  AST 15 <10*  --   ALT <5 <5  --   ALKPHOS 164* 139*  --   BILITOT 0.4 0.5  --   PROT 6.9 6.6  --  ALBUMIN  3.4* 3.6 3.6   No results for input(s): LIPASE, AMYLASE in the last 168 hours. No results for input(s): AMMONIA in the last 168 hours.  Coagulation Profile: No results for input(s): INR, PROTIME in the last 168 hours. Cardiac Enzymes: No results for input(s): CKTOTAL, CKMB, CKMBINDEX, TROPONINI in the last 168 hours. BNP (last 3 results) Recent Labs    11/29/23 1531 11/30/23 0300  PROBNP 1,820.0* 1,624.0*   HbA1C: No results for input(s): HGBA1C in the last 72 hours. CBG: Recent Labs  Lab 12/09/23 1030 12/09/23 1224 12/09/23 1616 12/09/23 2217 12/10/23 0803  GLUCAP 223* 268* 314* 258* 191*   Lipid Profile: No results for input(s): CHOL, HDL, LDLCALC, TRIG, CHOLHDL, LDLDIRECT in the last 72 hours. Thyroid Function Tests: No results for input(s): TSH, T4TOTAL, FREET4, T3FREE, THYROIDAB in  the last 72 hours. Anemia Panel: No results for input(s): VITAMINB12, FOLATE, FERRITIN, TIBC, IRON, RETICCTPCT in the last 72 hours. Sepsis Labs: No results for input(s): PROCALCITON, LATICACIDVEN in the last 168 hours.  Recent Results (from the past 240 hours)  Gastrointestinal Panel by PCR , Stool     Status: None   Collection Time: 12/07/23  1:12 PM   Specimen: Stool  Result Value Ref Range Status   Campylobacter species NOT DETECTED NOT DETECTED Final   Plesimonas shigelloides NOT DETECTED NOT DETECTED Final   Salmonella species NOT DETECTED NOT DETECTED Final   Yersinia enterocolitica NOT DETECTED NOT DETECTED Final   Vibrio species NOT DETECTED NOT DETECTED Final   Vibrio cholerae NOT DETECTED NOT DETECTED Final   Enteroaggregative E coli (EAEC) NOT DETECTED NOT DETECTED Final   Enteropathogenic E coli (EPEC) NOT DETECTED NOT DETECTED Final   Enterotoxigenic E coli (ETEC) NOT DETECTED NOT DETECTED Final   Shiga like toxin producing E coli (STEC) NOT DETECTED NOT DETECTED Final   Shigella/Enteroinvasive E coli (EIEC) NOT DETECTED NOT DETECTED Final   Cryptosporidium NOT DETECTED NOT DETECTED Final   Cyclospora cayetanensis NOT DETECTED NOT DETECTED Final   Entamoeba histolytica NOT DETECTED NOT DETECTED Final   Giardia lamblia NOT DETECTED NOT DETECTED Final   Adenovirus F40/41 NOT DETECTED NOT DETECTED Final   Astrovirus NOT DETECTED NOT DETECTED Final   Norovirus GI/GII NOT DETECTED NOT DETECTED Final   Rotavirus A NOT DETECTED NOT DETECTED Final   Sapovirus (I, II, IV, and V) NOT DETECTED NOT DETECTED Final    Comment: Performed at [facility], [address] [place], [place] [address]         Radiology Studies: No results found.       Scheduled Meds:  acidophilus  2 capsule Oral TID   allopurinol   100 mg Oral QHS   alteplase  2 mg Intracatheter Once   arformoterol  15 mcg Nebulization BID   atorvastatin   10 mg Oral QHS    budesonide (PULMICORT) nebulizer solution  0.5 mg Nebulization BID   calcitRIOL  0.25 mcg Oral Q M,W,F   Chlorhexidine  Gluconate Cloth  6 each Topical Daily   diclofenac Sodium  2 g Topical QID   fluticasone  2 spray Each Nare Daily   furosemide   80 mg Intravenous BID   heparin   5,000 Units Subcutaneous Q8H   insulin  aspart  0-20 Units Subcutaneous TID WC   insulin  aspart  0-5 Units Subcutaneous QHS   insulin  glargine-yfgn  25 Units Subcutaneous Daily   ipratropium-albuterol   3 mL Nebulization BID   loratadine   10 mg Oral Daily   methylPREDNISolone  (SOLU-MEDROL ) injection  60 mg Intravenous Q24H   metolazone  5 mg Oral Daily   midodrine   5 mg Oral TID WC   nystatin  cream  1 Application Topical BID   oxyCODONE   10 mg Oral BID   pantoprazole   40 mg Oral QAC breakfast   polyethylene glycol  17 g Oral Daily   potassium chloride   40 mEq Oral Daily   pregabalin   75 mg Oral Daily   sertraline  50 mg Oral q AM   sodium chloride  flush  10-40 mL Intracatheter Q12H   triamcinolone  1 Application Topical Daily   Continuous Infusions:  milrinone 0.25 mcg/kg/min (12/10/23 1106)     LOS: 11 days    Time spent: 51 minutes    Renato Applebaum, MD [facility]

## 2023-12-10 NOTE — Progress Notes (Signed)
 RT went to see if pt was ready to go on bipap for the night, pt stated she wasn't and wanted to wait until 1AM.

## 2023-12-10 NOTE — Progress Notes (Signed)
 Venous duplex lower ext  has been completed. Refer to [facility] under chart review to view preliminary results.   12/10/2023  1:45 PM Mahli Glahn, Ricka BIRCH

## 2023-12-11 DIAGNOSIS — J9601 Acute respiratory failure with hypoxia: Secondary | ICD-10-CM | POA: Diagnosis not present

## 2023-12-11 LAB — BASIC METABOLIC PANEL WITH GFR
Anion gap: 11 (ref 5–15)
BUN: 73 mg/dL — ABNORMAL HIGH (ref 8–23)
CO2: 34 mmol/L — ABNORMAL HIGH (ref 22–32)
Calcium: 9.8 mg/dL (ref 8.9–10.3)
Chloride: 88 mmol/L — ABNORMAL LOW (ref 98–111)
Creatinine, Ser: 2.74 mg/dL — ABNORMAL HIGH (ref 0.44–1.00)
GFR, Estimated: 18 mL/min — ABNORMAL LOW (ref >60)
Glucose, Bld: 227 mg/dL — ABNORMAL HIGH (ref 70–99)
Potassium: 3.8 mmol/L (ref 3.5–5.1)
Sodium: 134 mmol/L — ABNORMAL LOW (ref 135–145)

## 2023-12-11 LAB — GLUCOSE, CAPILLARY
Glucose-Capillary: 218 mg/dL — ABNORMAL HIGH (ref 70–99)
Glucose-Capillary: 231 mg/dL — ABNORMAL HIGH (ref 70–99)
Glucose-Capillary: 206 mg/dL — ABNORMAL HIGH (ref 70–99)
Glucose-Capillary: 240 mg/dL — ABNORMAL HIGH (ref 70–99)
Glucose-Capillary: 268 mg/dL — ABNORMAL HIGH (ref 70–99)

## 2023-12-11 LAB — COOXEMETRY PANEL
Carboxyhemoglobin: 1.9 % — ABNORMAL HIGH (ref 0.5–1.5)
Methemoglobin: 1.1 % (ref 0.0–1.5)
O2 Saturation: 77.5 %
Total hemoglobin: 11.8 g/dL — ABNORMAL LOW (ref 12.0–16.0)

## 2023-12-11 LAB — PRO BRAIN NATRIURETIC PEPTIDE: Pro Brain Natriuretic Peptide: 5609 pg/mL — ABNORMAL HIGH (ref <300.0)

## 2023-12-11 MED ORDER — INSULIN GLARGINE-YFGN 100 UNIT/ML ~~LOC~~ SOLN
35.0000 [IU] | Freq: Every day | SUBCUTANEOUS | Status: DC
  Administered 2023-12-12 – 2023-12-16 (×5): 35 [IU] via SUBCUTANEOUS
  Filled 2023-12-11 (×6): qty 0.35

## 2023-12-11 MED ORDER — MIDODRINE HCL 5 MG PO TABS
5.0000 mg | ORAL_TABLET | Freq: Three times a day (TID) | ORAL | Status: DC | PRN
  Administered 2023-12-17: 5 mg via ORAL
  Filled 2023-12-11: qty 1

## 2023-12-11 NOTE — Progress Notes (Signed)
  Progress Note  Patient Name: [name] [name] Date of Encounter: 12/11/2023 [facility] Cardiologist: Jerel Balding, MD   Interval Summary   Reports improvement in symptoms.   Vital Signs Vitals:   12/11/23 0400 12/11/23 0500 12/11/23 0600 12/11/23 0700  BP:      Pulse: 83 87 81 86  Resp:      Temp: 98.3 F (36.8 C)     TempSrc: Axillary     SpO2: 97% 96% 96% 95%  Weight: 128.4 kg     Height:        Intake/Output Summary (Last 24 hours) at 12/11/2023 0745 Last data filed at 12/11/2023 0700 Gross per 24 hour  Intake 302.39 ml  Output 4425 ml  Net -4122.61 ml      12/11/2023    4:00 AM 12/10/2023    5:00 AM 12/09/2023    5:00 AM  Last 3 Weights  Weight (lbs) 283 lb 1.1 oz 282 lb 6.6 oz 286 lb 13.1 oz  Weight (kg) 128.4 kg 128.1 kg 130.1 kg      Telemetry/ECG  Sinus rhythm with infrequent PVC HR ~85-90 - Personally Reviewed  Physical Exam  GEN: No acute distress laying in bed with [place] in place.   Neck: Unable to assess JVD with body habitus Cardiac: RRR, no murmurs, rubs, or gallops. Radial pulses 2+ Respiratory: Clear to auscultation bilaterally, though diminished breath sounds MS: 2+ pitting edema bilaterally  Assessment & Plan  Patient is a 67 year old female with pmhx of insulin  dependent T2DM, HFpEF with Cor Pulmonale, morbid obesity, severe deconditioning [bed bound], and hypertension who presented to [facility] ED on 10/8 for peripheral edema and weight gain with discomfort to the left side of the body especially the left breast. Pro-BNP 1820. Troponin 39. Patient was given IV diuresis and started in BiPAP. Patient was admitted with  Cardiology consulted.   Acute on Chronic Heart Failure (Cor Pulmonale) RV failure 2/2 sleep apnea, hypoxemia, obesity hypoventilation. She has not undergone RHC, however plan has been to hold off as the procedure would not change care plan unless there are concerns for hypovolemia. Yesterday lasix  drip was switched to IV  lasix  80 mg BID given concern for Cr rise. Co-Ox today 77.5 % Net IO Since Admission: -10,042.09 mL [12/11/23 0802] Currently -14lbs since admission BMP pending On exam patient appears volume up though body habitus due limited physical exam. Will continue to trend Cr. Will obtain Pro-BNP for trend as well.  Hopefully can discontinue daily metolazone tomorrow, though will reassess patient prior to discontinuation.   Continue milrinone  Continue IV lasix  80 mg BID Continue metolazone 5 mg today [ ensure given 30 minutes prior to IV diuresis for optimized effect] Continue K supplementation GDMT limited by renal function and hypotension on midodrine   Hypotension BP: 184/83 during interview Highest charted 194/83 Lowest 101/29 Supine hypertension in a patient who is bed bound status. May need to discontinue, will continue to trend blood pressure today and add hold parameters to midodrine .  Continue midodrine  5 mg Tid, hold if systolic BP is > 839.  Hyperlipidemia Continue lipitor 10 mg  Per primary Left breast cellulitis T2DM [A1c 8.8 %] CKD [Baseline Cr 2.1- 2.6 ] Chronic respiratory failure with hypoxia OHS and OSA Morbid Obesity Deconditioning   For questions or updates, please contact  [facility] Please consult [email or web address] for contact info under       Signed, Leontine LOISE Salen, PA-C

## 2023-12-11 NOTE — Progress Notes (Signed)
       Overnight   NAME: [name] [name] MRN: [id number] DOB : 1955/07/15    Date of Service   12/11/2023   HPI/Events of Note    Notified by RN for recurrent liquid stool. Patient has signs as earlier requiring Flexi-seal. Also skin compromised    Interventions/ Plan   Replace Flexi-seal for now Continue all Attending orders      Lynwood Kipper BSN MSNA MSN ACNPC-AG Acute Care Nurse Practitioner [facility]

## 2023-12-11 NOTE — Plan of Care (Signed)
  Problem: Coping: Goal: Ability to adjust to condition or change in health will improve Outcome: Progressing   Problem: Fluid Volume: Goal: Ability to maintain a balanced intake and output will improve Outcome: Progressing   Problem: Nutritional: Goal: Maintenance of adequate nutrition will improve Outcome: Progressing   Problem: Skin Integrity: Goal: Risk for impaired skin integrity will decrease Outcome: Progressing   Problem: Pain Managment: Goal: General experience of comfort will improve and/or be controlled Outcome: Progressing

## 2023-12-11 NOTE — Progress Notes (Signed)
 PROGRESS NOTE    CATHELEEN LANGHORNE  FMW:[id number] DOB: [birth year] DOA: 11/29/2023 PCP: [facility]    Brief Narrative:  [name] [name] is a 68 y.o. female with medical history significant for insulin -dependent type 2 diabetes, chronic heart failure with preserved EF, morbid obesity, bedbound, hypertension being admitted to the hospital with weight gain and peripheral edema due to suspected acute exacerbation of chronic heart failure with preserved EF.  Cardiology consulted.  Patient also with concerns for left breast cellulitis.  General surgery consulted.  Patient subsequently placed on a Lasix  drip as well as milrinone for diuresis per cardiology and PICC line placed.  At this time cardiology following.  Still on diuresis and inotropes.   Subjective:  Patient seen and examined.  She denies any complaints.  She feels better.  Compliant to BiPAP overnight.  Massive urine output last 24 hours on Foley catheter. Creatinine improving.  Assessment & Plan:   Acute on chronic heart failure with preserved EF. Uses 4 L oxygen  at home.  Uses BiPAP at night.  Presented with shortness of breath. Echocardiogram showed EF 6065%.  No valvular abnormality. Received IV Lasix  and metolazone initially , did not respond and was started on milrinone and Lasix  infusion. Cardiology following. Clinically improving.  Milrinone ongoing.  Back on IV Lasix  80 mg twice daily today. Per cardiology, patient is not a candidate for advanced heart failure treatment and RHC would not add value to her care. Nephrology was also consulted for volume management but no need for CRRT.  Intermittently on BiPAP. -9 L since admission Urine output 4000 mL last 24 hours, apparently accurate output as patient has Foley catheter. Clinically improving.  Acute hypoxic respiratory failure secondary to hypoxia as well as hypercarbia. Respiratory acidosis. Acute COPD exacerbation secondary pneumonia Initial ABG  showed pCO2 of 73.  Improving with BiPAP nightly. On Solu-Medrol  Pulmicort Brovana and DuoNeb, Claritin  PPI and BiPAP as needed nightly. Treated with Zyvox  and cefazolin .   Hypokalemia.   Replaced and adequate.    Diarrhea.  Resolved. Uncertain etiology.  Continue probiotic.  Discontinue rectal tube. GI pathogen panel negative.   OSA with right-sided heart failure in the setting of suspected pulmonary hypertension/obesity hypoventilation syndrome Continue BiPAP at night   AKI on CKD stage 4 Baseline creatinine around 2.1. Upon admission serum creatinine was 1.9.  Current serum creatinine trending up- 3.2-2.7.  Today.  Multifactorial etiology. Nephrology was consulted.  No CRRT at this time.  Per nephrology patient not a candidate for long-term hemodialysis.  Nephrology signed off 10/18. On milrinone drip, midodrine  and IV diuretics.  Monitor closely.   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 Continue sliding scale insulin .  Basal insulin  rate increased.  Diet changed to carb modified diet as well. Hyperglycemia secondary to IV steroids.   Diabetic neuropathy. On Lyrica .  Dose adjusted for renal dysfunction and mentation.   Left breast cellulitis versus breast swelling secondary to dependent edema Currently on linezolid , completed 7-day treatment course. General Surgery was consulted, recommended nursing place several folded towels under left breast to keep it elevated.   Toxic metabolic encephalopathy secondary to hypoxia, hypercarbia as well as Lyrica . Mentation improving after adjusting Lyrica  dose and BiPAP therapy. Improved, communicative and oriented.   Chronic hypotension Currently on midodrine , milrinone and Lasix  drip.    Hyperlipidemia Continue Lipitor   Obesity Class 3 Body mass index is 52.46 kg/m (pended).  Placing the pt at higher risk of poor outcomes.  Bedbound  due to previous history of lower  extremity fracture.   Anxiety/depression Continue Zoloft.     Concern for vaginal fistula-ruled out Bedside RN noted stool coming out from vagina confirmed by the patient as well.  CT abdomen and pelvis without contrast without any evidence of vaginal fistula. Shows evidence of pneumonia.   Lower extremity edema. Doppler pending to rule out DVT. Patient chronically bedbound.    DVT prophylaxis: heparin  injection 5,000 Units Start: 11/29/23 2200   Code Status: Full code Family Communication: None at the bedside. Disposition Plan: Status is: Inpatient Remains inpatient appropriate because: IV inotropes.  Possibly home once able to wean off inotropes.     Consultants:  Cardiology  Procedures:  None  Antimicrobials:  Completed antibiotics     Objective: Vitals:   12/11/23 0755 12/11/23 0816 12/11/23 0817 12/11/23 0818  BP:      Pulse:      Resp:      Temp: 98.1 F (36.7 C)     TempSrc: Axillary     SpO2:  96% 96% 94%  Weight:      Height:        Intake/Output Summary (Last 24 hours) at 12/11/2023 1016 Last data filed at 12/11/2023 0840 Gross per 24 hour  Intake 224.51 ml  Output 3500 ml  Net -3275.49 ml   Filed Weights   12/09/23 0500 12/10/23 0500 12/11/23 0400  Weight: 130.1 kg 128.1 kg 128.4 kg    Examination:  General: Morbidly obese.  Alert awake and oriented.  Chronically sick looking.  Pleasant interaction.   Cardiovascular: S1-S2 normal.  Regular rate rhythm. Respiratory: Bilateral clear.  No added sounds.  On 4 L oxygen . Gastrointestinal: Soft and nontender.  Obese and pendulous.  Bowel sound present. Ext: 1+ bilateral edema.  Nonpitting edema.  Chronic venous stasis changes. Neuro: Intact.  Gross generalized weakness.     Data Reviewed: I have personally reviewed following labs and imaging studies  CBC: Recent Labs  Lab 12/05/23 0809 12/06/23 0500 12/07/23 0525 12/08/23 0532 12/09/23 1234 12/10/23 0445  WBC 9.3 7.9 5.5 4.3 8.0  7.0  NEUTROABS 8.5* 7.1  --   --  7.3  --   HGB 11.0* 10.4* 10.4* 11.3* 10.5* 10.1*  HCT 40.3 39.4 37.8 39.9 38.0 35.9*  MCV 80.0 80.1 78.6* 77.2* 76.0* 75.6*  PLT 311 277 259 282 229 199   Basic Metabolic Panel: Recent Labs  Lab 12/06/23 0500 12/07/23 0525 12/08/23 0532 12/09/23 1234 12/10/23 0445 12/11/23 0847  NA 138 136 136 132* 133* 134*  K 3.4* 3.4* 3.3* 3.8 3.8 3.8  CL 96* 92* 91* 89* 89* 88*  CO2 28 30 32 32 33* 34*  GLUCOSE 499* 392* 283* 276* 343* 227*  BUN 35* 43* 51* 64* 69* 73*  CREATININE 2.38* 2.60* 2.66* 2.79* 3.20* 2.74*  CALCIUM  9.0 9.8 10.3 9.8 9.7 9.8  MG 2.0 2.1 2.1 1.9 2.1  --   PHOS  --  3.6 3.2  --  3.6  --    GFR: Estimated Creatinine Clearance: 24.8 mL/min (A) (by C-G formula based on SCr of 2.74 mg/dL (H)). Liver Function Tests: Recent Labs  Lab 12/09/23 1234 12/10/23 0445  AST <10*  --   ALT <5  --   ALKPHOS 139*  --   BILITOT 0.5  --   PROT 6.6  --   ALBUMIN  3.6 3.6   No results for input(s): LIPASE, AMYLASE in the last 168 hours. No results for input(s): AMMONIA  in the last 168 hours.  Coagulation Profile: No results for input(s): INR, PROTIME in the last 168 hours. Cardiac Enzymes: No results for input(s): CKTOTAL, CKMB, CKMBINDEX, TROPONINI in the last 168 hours. BNP (last 3 results) Recent Labs    11/29/23 1531 11/30/23 0300  PROBNP 1,820.0* 1,624.0*   HbA1C: No results for input(s): HGBA1C in the last 72 hours. CBG: Recent Labs  Lab 12/10/23 1603 12/10/23 2001 12/10/23 2231 12/11/23 0410 12/11/23 0753  GLUCAP 290* 264* 279* 218* 231*   Lipid Profile: No results for input(s): CHOL, HDL, LDLCALC, TRIG, CHOLHDL, LDLDIRECT in the last 72 hours. Thyroid Function Tests: No results for input(s): TSH, T4TOTAL, FREET4, T3FREE, THYROIDAB in the last 72 hours. Anemia Panel: No results for input(s): VITAMINB12, FOLATE, FERRITIN, TIBC, IRON, RETICCTPCT in the last 72  hours. Sepsis Labs: No results for input(s): PROCALCITON, LATICACIDVEN in the last 168 hours.  Recent Results (from the past 240 hours)  Gastrointestinal Panel by PCR , Stool     Status: None   Collection Time: 12/07/23  1:12 PM   Specimen: Stool  Result Value Ref Range Status   Campylobacter species NOT DETECTED NOT DETECTED Final   Plesimonas shigelloides NOT DETECTED NOT DETECTED Final   Salmonella species NOT DETECTED NOT DETECTED Final   Yersinia enterocolitica NOT DETECTED NOT DETECTED Final   Vibrio species NOT DETECTED NOT DETECTED Final   Vibrio cholerae NOT DETECTED NOT DETECTED Final   Enteroaggregative E coli (EAEC) NOT DETECTED NOT DETECTED Final   Enteropathogenic E coli (EPEC) NOT DETECTED NOT DETECTED Final   Enterotoxigenic E coli (ETEC) NOT DETECTED NOT DETECTED Final   Shiga like toxin producing E coli (STEC) NOT DETECTED NOT DETECTED Final   Shigella/Enteroinvasive E coli (EIEC) NOT DETECTED NOT DETECTED Final   Cryptosporidium NOT DETECTED NOT DETECTED Final   Cyclospora cayetanensis NOT DETECTED NOT DETECTED Final   Entamoeba histolytica NOT DETECTED NOT DETECTED Final   Giardia lamblia NOT DETECTED NOT DETECTED Final   Adenovirus F40/41 NOT DETECTED NOT DETECTED Final   Astrovirus NOT DETECTED NOT DETECTED Final   Norovirus GI/GII NOT DETECTED NOT DETECTED Final   Rotavirus A NOT DETECTED NOT DETECTED Final   Sapovirus (I, II, IV, and V) NOT DETECTED NOT DETECTED Final    Comment: Performed at [facility], [address] [place], [place] [address]         Radiology Studies: VAS US  LOWER EXTREMITY VENOUS (DVT) Result Date: 12/10/2023  Lower Venous DVT Study Patient Name:  LODEMA PARMA  Date of Exam:   12/10/2023 Medical Rec #: [id number]           Accession #:    [id number] Date of Birth: [birth year]           Patient Gender: F Patient Age:   72 years Exam Location:  [facility] Procedure:      VAS US  LOWER EXTREMITY VENOUS  (DVT) Referring Phys: PRANAV PATEL --------------------------------------------------------------------------------  Indications: Edema. Other Indications: CHF. Risk Factors: Morbidly obese, bedbound. Limitations: Poor ultrasound/tissue interface and body habitus. Comparison Study: 03/28/23 - right leg venous -patent with limited evaluation                   03/31/23 - left leg venous - patent with limited evaluation Performing Technologist: Ricka Sturdivant-Jones RDMS, RVT  Examination Guidelines: A complete evaluation includes B-mode imaging, spectral Doppler, color Doppler, and power Doppler as needed of all accessible portions of each vessel. Bilateral testing is considered an  integral part of a complete examination. Limited examinations for reoccurring indications may be performed as noted. The reflux portion of the exam is performed with the patient in reverse Trendelenburg.  +---------+---------------+---------+-----------+----------+-------------------+ RIGHT    CompressibilityPhasicitySpontaneityPropertiesThrombus Aging      +---------+---------------+---------+-----------+----------+-------------------+ CFV      Full           Yes      Yes                                      +---------+---------------+---------+-----------+----------+-------------------+ SFJ      Full                                                             +---------+---------------+---------+-----------+----------+-------------------+ FV Prox  Full                                                             +---------+---------------+---------+-----------+----------+-------------------+ FV Mid                  Yes      Yes                  patent by color     +---------+---------------+---------+-----------+----------+-------------------+ FV Distal                                             Not well visualized +---------+---------------+---------+-----------+----------+-------------------+ PFV       Full                                                             +---------+---------------+---------+-----------+----------+-------------------+ POP                     Yes      Yes                  Patent by color     +---------+---------------+---------+-----------+----------+-------------------+ PTV                                                   Not well visualized +---------+---------------+---------+-----------+----------+-------------------+ PERO                                                  Not well visualized +---------+---------------+---------+-----------+----------+-------------------+ Suboptimal study due to patient's body habitus. +---------+---------------+---------+-----------+----------+-------------------+ LEFT     CompressibilityPhasicitySpontaneityPropertiesThrombus Aging      +---------+---------------+---------+-----------+----------+-------------------+ CFV      Full  Yes      Yes                                      +---------+---------------+---------+-----------+----------+-------------------+ SFJ      Full                                                             +---------+---------------+---------+-----------+----------+-------------------+ FV Prox  Full                                                             +---------+---------------+---------+-----------+----------+-------------------+ FV Mid                  Yes      Yes                  patent by color     +---------+---------------+---------+-----------+----------+-------------------+ FV Distal               Yes      Yes                  patent by color     +---------+---------------+---------+-----------+----------+-------------------+ PFV      Full                                                             +---------+---------------+---------+-----------+----------+-------------------+ POP      Full           Yes      Yes                                       +---------+---------------+---------+-----------+----------+-------------------+ PTV                                                   Not well visualized +---------+---------------+---------+-----------+----------+-------------------+ PERO                                                  Not well visualized +---------+---------------+---------+-----------+----------+-------------------+  Summary: RIGHT: - There is no evidence of deep vein thrombosis in the lower extremity. However, portions of this examination were limited- see technologist comments above.  LEFT: - There is no evidence of deep vein thrombosis in the lower extremity. However, portions of this examination were limited- see technologist comments above.  *See table(s) above for measurements and observations. Electronically signed by Lonni Gaskins MD on 12/10/2023 at 2:28:45 PM.    Final  Scheduled Meds:  acidophilus  2 capsule Oral TID   allopurinol   100 mg Oral QHS   alteplase  2 mg Intracatheter Once   arformoterol  15 mcg Nebulization BID   atorvastatin   10 mg Oral QHS   budesonide (PULMICORT) nebulizer solution  0.5 mg Nebulization BID   calcitRIOL  0.25 mcg Oral Q M,W,F   Chlorhexidine  Gluconate Cloth  6 each Topical Daily   diclofenac Sodium  2 g Topical QID   fluticasone  2 spray Each Nare Daily   furosemide   80 mg Intravenous BID   heparin   5,000 Units Subcutaneous Q8H   insulin  aspart  0-20 Units Subcutaneous TID WC   insulin  aspart  0-5 Units Subcutaneous QHS   insulin  glargine-yfgn  25 Units Subcutaneous Daily   ipratropium-albuterol   3 mL Nebulization BID   loratadine   10 mg Oral Daily   methylPREDNISolone  (SOLU-MEDROL ) injection  60 mg Intravenous Q24H   metolazone  5 mg Oral Daily   midodrine   5 mg Oral TID WC   nystatin  cream  1 Application Topical BID   oxyCODONE   10 mg Oral BID   pantoprazole   40 mg Oral QAC breakfast   polyethylene glycol  17  g Oral Daily   potassium chloride   40 mEq Oral Daily   pregabalin   75 mg Oral Daily   sertraline  50 mg Oral q AM   sodium chloride  flush  10-40 mL Intracatheter Q12H   triamcinolone  1 Application Topical Daily   Continuous Infusions:  milrinone 0.25 mcg/kg/min (12/11/23 0700)     LOS: 12 days    Time spent: 51 minutes    Renato Applebaum, MD [facility]

## 2023-12-12 ENCOUNTER — Inpatient Hospital Stay (HOSPITAL_COMMUNITY)

## 2023-12-12 DIAGNOSIS — J9601 Acute respiratory failure with hypoxia: Secondary | ICD-10-CM | POA: Diagnosis not present

## 2023-12-12 LAB — BASIC METABOLIC PANEL WITH GFR
Anion gap: 13 (ref 5–15)
BUN: 77 mg/dL — ABNORMAL HIGH (ref 8–23)
CO2: 33 mmol/L — ABNORMAL HIGH (ref 22–32)
Calcium: 9.9 mg/dL (ref 8.9–10.3)
Chloride: 90 mmol/L — ABNORMAL LOW (ref 98–111)
Creatinine, Ser: 2.63 mg/dL — ABNORMAL HIGH (ref 0.44–1.00)
GFR, Estimated: 19 mL/min — ABNORMAL LOW (ref >60)
Glucose, Bld: 180 mg/dL — ABNORMAL HIGH (ref 70–99)
Potassium: 3.5 mmol/L (ref 3.5–5.1)
Sodium: 136 mmol/L (ref 135–145)

## 2023-12-12 LAB — GLUCOSE, CAPILLARY
Glucose-Capillary: 146 mg/dL — ABNORMAL HIGH (ref 70–99)
Glucose-Capillary: 113 mg/dL — ABNORMAL HIGH (ref 70–99)
Glucose-Capillary: 168 mg/dL — ABNORMAL HIGH (ref 70–99)
Glucose-Capillary: 163 mg/dL — ABNORMAL HIGH (ref 70–99)

## 2023-12-12 LAB — COOXEMETRY PANEL
Carboxyhemoglobin: 1.6 % — ABNORMAL HIGH (ref 0.5–1.5)
Methemoglobin: <0.7 % (ref 0.0–1.5)
O2 Saturation: 79.4 %
Total hemoglobin: 12.6 g/dL (ref 12.0–16.0)

## 2023-12-12 NOTE — Hospital Course (Signed)
 [name] [name] is a 68 y.o. female with medical history significant for insulin -dependent type 2 diabetes, chronic heart failure with preserved EF, morbid obesity, bedbound, hypertension being admitted to the hospital with weight gain and peripheral edema due to suspected acute exacerbation of chronic heart failure with preserved EF.  Cardiology consulted.  Patient also with concerns for left breast cellulitis.  General surgery consulted.  Patient subsequently placed on a Lasix  drip as well as milrinone for diuresis per cardiology and PICC line placed.  At this time cardiology following.  Still on diuresis and inotropes.    Assessment and plan. Acute on chronic heart failure with preserved EF. Uses 5 LPM at baseline.  Presents with shortness of breath. Echocardiogram showed EF 6065%.  No valvular abnormality. Received IV Lasix  and metolazone initially. Cardiology was consulted Patient with minimal urine output despite aggressive diuresis and now on Primacor drip and Lasix  drip. Per cardiology, patient is not a candidate for advanced heart failure treatment and RHC would not add value to her care. Nephrology was also consulted for volume management but no need for CRRT.  Off of BiPAP. Remains negative balance over last 24 hours. For now plan is continuing diuresis with IV Lasix , Zaroxolyn, Primacor and midodrine .   Acute hypoxic respiratory failure secondary to hypoxia as well as hypercarbia. Respiratory acidosis. Acute COPD exacerbation secondary pneumonia Initial ABG showed pCO2 of 73.  Improving with BiPAP nightly. On Solu-Medrol  Pulmicort Brovana and DuoNeb, Claritin  PPI and BiPAP as needed nightly. Treated with Zyvox  and cefazolin .   Hypokalemia.   Replaced.   Diarrhea.  Resolved. Uncertain etiology.  Continue probiotic and rectal tube. GI pathogen panel negative.   OSA with right-sided heart failure in the setting of suspected pulmonary hypertension/obesity hypoventilation  syndrome Continue BiPAP at night   AKI on CKD stage 4 Baseline creatinine around 2.1. Upon admission serum creatinine was 1.9.  Current serum creatinine trending up. Multifactorial etiology. Nephrology was consulted.  No CRRT at this time.   Per nephrology patient not a candidate for long-term hemodialysis.  Nephrology signed off 10/18. On milrinone drip, midodrine  and IV diuretics.   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 On sliding scale insulin  only. Used to be on Lantus .  Currently stopped. Hyperglycemia secondary to IV steroids.   Diabetic neuropathy. On Lyrica .  Dose adjusted for renal dysfunction and mentation.   Left breast cellulitis versus breast swelling secondary to dependent edema Treated with linezolid , completed 7-day treatment course. General Surgery was consulted, recommended nursing place several folded towels under left breast to keep it elevated.   Toxic metabolic encephalopathy secondary to hypoxia, hypercarbia as well as Lyrica . Mentation improving after adjusting Lyrica  dose and BiPAP therapy. Improved, communicative and oriented.   Chronic hypotension Currently on midodrine , milrinone and Lasix .    Hyperlipidemia Continue Lipitor   Obesity Class 3 Body mass index is 49.52 kg/m (pended).  Placing the pt at higher risk of poor outcomes.   Bedbound  due to previous history of lower extremity fracture.   Anxiety/depression Continue Zoloft.     Concern for vaginal fistula-ruled out Bedside RN noted stool coming out from vagina confirmed by the patient as well.  CT abdomen and pelvis without contrast without any evidence of vaginal fistula. Shows evidence of pneumonia.   Lower extremity edema. Doppler ordered to rule out DVT.  Patient chronically bedbound.  Anisocoria. Left pupil more than right. CT scan of the head negative for any acute abnormality.  No  other focal deficit.  Monitor.

## 2023-12-12 NOTE — Progress Notes (Signed)
   12/12/23 2331  BiPAP/CPAP/SIPAP  BiPAP/CPAP/SIPAP Pt Type Adult  BiPAP/CPAP/SIPAP V60  Mask Type Full face mask  Dentures removed? Not applicable  Mask Size Medium  Set Rate 16 breaths/min  Respiratory Rate 17 breaths/min  IPAP 20 cmH20  EPAP 10 cmH2O  FiO2 (%) 40 %  Minute Ventilation 6.9  Leak 21  Peak Inspiratory Pressure (PIP) 20  Tidal Volume (Vt) 341  Patient Home Machine No  Patient Home Mask No  Patient Home Tubing No  Auto Titrate No  Press High Alarm 30 cmH2O  Press Low Alarm 5 cmH2O  CPAP/SIPAP surface wiped down Yes  Device Plugged into RED Power Outlet Yes  BiPAP/CPAP /SiPAP Vitals  Pulse Rate (!) 101  Resp 17  SpO2 95 %  MEWS Score/Color  MEWS Score 1  MEWS Score Color Green

## 2023-12-12 NOTE — Progress Notes (Signed)
 [facility] Progress Note Patient: [name] [name] FMW:[id number] DOB: 09/25/1955  DOA: 11/29/2023 DOS: the patient was seen and examined on 12/12/2023  Brief Hospital Course: [name] [name] is a 68 y.o. female with medical history significant for insulin -dependent type 2 diabetes, chronic heart failure with preserved EF, morbid obesity, bedbound, hypertension being admitted to the hospital with weight gain and peripheral edema due to suspected acute exacerbation of chronic heart failure with preserved EF.  Cardiology consulted.  Patient also with concerns for left breast cellulitis.  General surgery consulted.  Patient subsequently placed on a Lasix  drip as well as milrinone for diuresis per cardiology and PICC line placed.  At this time cardiology following.  Still on diuresis and inotropes.    Assessment and plan. Acute on chronic heart failure with preserved EF. Uses 5 LPM at baseline.  Presents with shortness of breath. Echocardiogram showed EF 6065%.  No valvular abnormality. Received IV Lasix  and metolazone initially. Cardiology was consulted Patient with minimal urine output despite aggressive diuresis and now on Primacor drip and Lasix  drip. Per cardiology, patient is not a candidate for advanced heart failure treatment and RHC would not add value to her care. Nephrology was also consulted for volume management but no need for CRRT.  Off of BiPAP. Remains negative balance over last 24 hours. For now plan is continuing diuresis with IV Lasix , Zaroxolyn, Primacor and midodrine .   Acute hypoxic respiratory failure secondary to hypoxia as well as hypercarbia. Respiratory acidosis. Acute COPD exacerbation secondary pneumonia Initial ABG showed pCO2 of 73.  Improving with BiPAP nightly. On Solu-Medrol  Pulmicort Brovana and DuoNeb, Claritin  PPI and BiPAP as needed nightly. Treated with Zyvox  and cefazolin .   Hypokalemia.   Replaced.   Diarrhea.  Resolved. Uncertain  etiology.  Continue probiotic and rectal tube. GI pathogen panel negative.   OSA with right-sided heart failure in the setting of suspected pulmonary hypertension/obesity hypoventilation syndrome Continue BiPAP at night   AKI on CKD stage 4 Baseline creatinine around 2.1. Upon admission serum creatinine was 1.9.  Current serum creatinine trending up. Multifactorial etiology. Nephrology was consulted.  No CRRT at this time.   Per nephrology patient not a candidate for long-term hemodialysis.  Nephrology signed off 10/18. On milrinone drip, midodrine  and IV diuretics.   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 On sliding scale insulin  only. Used to be on Lantus .  Currently stopped. Hyperglycemia secondary to IV steroids.   Diabetic neuropathy. On Lyrica .  Dose adjusted for renal dysfunction and mentation.   Left breast cellulitis versus breast swelling secondary to dependent edema Treated with linezolid , completed 7-day treatment course. General Surgery was consulted, recommended nursing place several folded towels under left breast to keep it elevated.   Toxic metabolic encephalopathy secondary to hypoxia, hypercarbia as well as Lyrica . Mentation improving after adjusting Lyrica  dose and BiPAP therapy. Improved, communicative and oriented.   Chronic hypotension Currently on midodrine , milrinone and Lasix .    Hyperlipidemia Continue Lipitor   Obesity Class 3 Body mass index is 49.52 kg/m (pended).  Placing the pt at higher risk of poor outcomes.   Bedbound  due to previous history of lower extremity fracture.   Anxiety/depression Continue Zoloft.     Concern for vaginal fistula-ruled out Bedside RN noted stool coming out from vagina confirmed by the patient as well.  CT abdomen and pelvis without contrast without any evidence of vaginal fistula. Shows evidence of pneumonia.   Lower extremity edema. Doppler  ordered to rule out DVT.  Patient chronically bedbound.  Anisocoria. Left pupil more than right. CT scan of the head negative for any acute abnormality.  No other focal deficit.  Monitor.   Subjective: No nausea no vomiting.  Diarrhea reported.  No abnormal.  No fever or chills.   Physical Exam: Clear to auscultation. S1-S2 present no send bowel sound present Nontender. Trace edema. Pupils are unequal.  Left more than right.  No other focal deficits.  No asterixis.  Data Reviewed: I have Reviewed nursing notes, Vitals, and Lab results. Since last encounter, pertinent lab results CBC BMP   . I have ordered test including CBC and BMP  . I have ordered imaging CT head  .   Disposition: Status is: Inpatient Remains inpatient appropriate because: Monitor for improvement in mentation and volume overload  heparin  injection 5,000 Units Start: 11/29/23 2200   Family Communication: No one at bedside Level of care: ICU   Vitals:   12/12/23 1200 12/12/23 1215 12/12/23 1300 12/12/23 1625  BP: 131/63  137/65   Pulse:      Resp: 16  14   Temp:  98.5 F (36.9 C)  98.4 F (36.9 C)  TempSrc:  Axillary  Axillary  SpO2:      Weight:      Height:         Author: Yetta Blanch, MD 12/12/2023 5:23 PM  Please look on [email or web address] to find out who is on call.

## 2023-12-12 NOTE — Plan of Care (Signed)
  Problem: Nutritional: Goal: Maintenance of adequate nutrition will improve Outcome: Progressing   Problem: Nutrition: Goal: Adequate nutrition will be maintained Outcome: Progressing   Problem: Coping: Goal: Level of anxiety will decrease Outcome: Progressing

## 2023-12-12 NOTE — TOC Progression Note (Signed)
 Transition of Care [facility]) - Progression Note    Patient Details  Name: [name] [name] MRN: [id number] Date of Birth: 04/18/1955  [facility]) CM/SW Contact  Jon ONEIDA Anon, RN Phone Number: 12/12/2023, 10:45 AM  Clinical Narrative:    Pt needing continued medical workup, not yet medically stable for discharge. ICM will continue to follow to assist in any DC needs.     Barriers to Discharge: Continued Medical Work up               Expected Discharge Plan and Services In-house Referral: NA Discharge Planning Services: NA   Living arrangements for the past 2 months: Apartment                 DME Arranged: N/A DME Agency: NA       HH Arranged: NA HH Agency: NA         Social Drivers of Health (SDOH) Interventions SDOH Screenings   Food Insecurity: No Food Insecurity (11/29/2023)  Housing: Low Risk  (11/29/2023)  Transportation Needs: No Transportation Needs (11/29/2023)  Utilities: Not At Risk (11/29/2023)  Alcohol  Screen: Low Risk  (03/29/2023)  Depression (PHQ2-9): Low Risk  (03/29/2023)  Financial Resource Strain: Patient Declined (05/16/2022)   Received from [facility]  Physical Activity: Inactive (03/29/2023)  Social Connections: Unknown (12/01/2023)  Stress: No Stress Concern Present (05/16/2022)   Received from [facility]  Tobacco Use: Medium Risk (12/01/2023)    Readmission Risk Interventions    12/02/2023   11:20 AM 03/28/2023   11:29 AM 02/23/2023   11:48 AM  Readmission Risk Prevention Plan  Transportation Screening Complete Complete Complete  PCP or Specialist Appt within 3-5 Days   Complete  HRI or Home Care Consult   Complete  Social Work Consult for Recovery Care Planning/Counseling   Complete  Palliative Care Screening   Not Applicable  Medication Review Oceanographer) Complete Complete Complete  PCP or Specialist appointment within 3-5 days of discharge Complete    HRI or Home Care Consult Complete Complete   SW  Recovery Care/Counseling Consult Complete Complete   Palliative Care Screening Not Applicable Not Applicable   Skilled Nursing Facility Not Applicable Not Applicable

## 2023-12-12 NOTE — H&P (View-Only) (Signed)
 Progress Note  Patient Name: [name] [name] Date of Encounter: 12/12/2023 [facility] Cardiologist: Jerel Balding, MD   Interval Summary   68 y.o. female with a history of right-sided heart failure and heart failure with preserved EF morbid obesity and chronic hypoxia on 4 L of oxygen , bedbound with a history of hypertension who we are following for diuresis.  RV failure felt secondary to sleep apnea, hypoxia, obesity hypoventilation syndrome and at home had been on torsemide  p.o. right heart cath has been discussed but deferred due to the fact that her comorbidities are likely nonmodifiable and right heart cath may not provide additional incremental diagnostic benefit.  She has been in the hospital for 11 days.  She has been on milrinone since 12/03/2023.   She has received 1 dose of acetazolamide for contraction alkalosis with minimal response diuretic per report.  She had been on a Lasix  infusion that was transition to Lasix  IV 80 mg twice daily representing a dose decrease 12/10/2023, with subsequent improvement in renal function.  She continues to made adequate urine on milrinone.  Vital Signs Vitals:   12/12/23 1000 12/12/23 1200 12/12/23 1215 12/12/23 1300  BP: (!) 162/66 131/63  137/65  Pulse: (!) 105     Resp: 19 16  14   Temp:   98.5 F (36.9 C)   TempSrc:   Axillary   SpO2: 94%     Weight:      Height:        Intake/Output Summary (Last 24 hours) at 12/12/2023 1411 Last data filed at 12/12/2023 1310 Gross per 24 hour  Intake 202.11 ml  Output 2100 ml  Net -1897.89 ml      12/12/2023    5:00 AM 12/11/2023    4:00 AM 12/10/2023    5:00 AM  Last 3 Weights  Weight (lbs) 270 lb 11.6 oz 283 lb 1.1 oz 282 lb 6.6 oz  Weight (kg) 122.8 kg 128.4 kg 128.1 kg      Telemetry/ECG  SR no arrhythmias - Personally Reviewed  Physical Exam  GEN: No acute distress.   Neck: cannot assess JVD Cardiac: RRR, no murmurs, rubs, or gallops.  Respiratory: Clear to  auscultation bilaterally. GI: Soft, nontender, non-distended  MS: puffy edema, warm  Assessment & Plan  Acute on chronic heart failure, heart failure with preserved ejection fraction and right heart failure -Creatinine improved slightly today, urine output excellent yesterday, continue current management with current dose of milrinone and IV Lasix  through today.  Co. ox 79. -Right heart cath has been discussed, but would not likely change prognosis or management at this time.  If there is a clinical change, will reconsider right heart cath. -Continue IV Lasix  80 mg twice daily   Obesity hypoventilation syndrome Sleep apnea -Continue BiPAP as needed and with sleep   Hypertension, with hypotension during diuresis - hypertensive in last 24 hours. - Now midodrine  discontinued, agree.   Cardiology to follow to assist with management of milrinone in a consultative fashion.     For questions or updates, please contact [facility] Please consult [email or web address] for contact info under        CRITICAL CARE TIME Performed by: [name] A Muriel Wilber, MD   Total critical care time: 35 minutes  Critical care time was exclusive of separately billable procedures and treating other patients.  Critical care was necessary to treat or prevent imminent or life-threatening deterioration.  Critical care was time spent personally by me on the following activities: development  of treatment plan with patient and/or surrogate as well as nursing, discussions with consultants, evaluation of patient's response to treatment, examination of patient, obtaining history from patient or surrogate, ordering and performing treatments and interventions, ordering and review of laboratory studies, ordering and review of radiographic studies, pulse oximetry and re-evaluation of patient's condition. Signed, Soyla DELENA [employer]ck, MD

## 2023-12-12 NOTE — Progress Notes (Signed)
 Progress Note  Patient Name: [name] [name] Date of Encounter: 12/12/2023 [facility] Cardiologist: Jerel Balding, MD   Interval Summary   68 y.o. female with a history of right-sided heart failure and heart failure with preserved EF morbid obesity and chronic hypoxia on 4 L of oxygen , bedbound with a history of hypertension who we are following for diuresis.  RV failure felt secondary to sleep apnea, hypoxia, obesity hypoventilation syndrome and at home had been on torsemide  p.o. right heart cath has been discussed but deferred due to the fact that her comorbidities are likely nonmodifiable and right heart cath may not provide additional incremental diagnostic benefit.  She has been in the hospital for 11 days.  She has been on milrinone since 12/03/2023.   She has received 1 dose of acetazolamide for contraction alkalosis with minimal response diuretic per report.  She had been on a Lasix  infusion that was transition to Lasix  IV 80 mg twice daily representing a dose decrease 12/10/2023, with subsequent improvement in renal function.  She continues to made adequate urine on milrinone.  Vital Signs Vitals:   12/12/23 1000 12/12/23 1200 12/12/23 1215 12/12/23 1300  BP: (!) 162/66 131/63  137/65  Pulse: (!) 105     Resp: 19 16  14   Temp:   98.5 F (36.9 C)   TempSrc:   Axillary   SpO2: 94%     Weight:      Height:        Intake/Output Summary (Last 24 hours) at 12/12/2023 1411 Last data filed at 12/12/2023 1310 Gross per 24 hour  Intake 202.11 ml  Output 2100 ml  Net -1897.89 ml      12/12/2023    5:00 AM 12/11/2023    4:00 AM 12/10/2023    5:00 AM  Last 3 Weights  Weight (lbs) 270 lb 11.6 oz 283 lb 1.1 oz 282 lb 6.6 oz  Weight (kg) 122.8 kg 128.4 kg 128.1 kg      Telemetry/ECG  SR no arrhythmias - Personally Reviewed  Physical Exam  GEN: No acute distress.   Neck: cannot assess JVD Cardiac: RRR, no murmurs, rubs, or gallops.  Respiratory: Clear to  auscultation bilaterally. GI: Soft, nontender, non-distended  MS: puffy edema, warm  Assessment & Plan  Acute on chronic heart failure, heart failure with preserved ejection fraction and right heart failure -Creatinine improved slightly today, urine output excellent yesterday, continue current management with current dose of milrinone and IV Lasix  through today.  Co. ox 79. -Right heart cath has been discussed, but would not likely change prognosis or management at this time.  If there is a clinical change, will reconsider right heart cath. -Continue IV Lasix  80 mg twice daily   Obesity hypoventilation syndrome Sleep apnea -Continue BiPAP as needed and with sleep   Hypertension, with hypotension during diuresis - hypertensive in last 24 hours. - Now midodrine  discontinued, agree.   Cardiology to follow to assist with management of milrinone in a consultative fashion.     For questions or updates, please contact [facility] Please consult [email or web address] for contact info under        CRITICAL CARE TIME Performed by: [name] A Muriel Wilber, MD   Total critical care time: 35 minutes  Critical care time was exclusive of separately billable procedures and treating other patients.  Critical care was necessary to treat or prevent imminent or life-threatening deterioration.  Critical care was time spent personally by me on the following activities: development  of treatment plan with patient and/or surrogate as well as nursing, discussions with consultants, evaluation of patient's response to treatment, examination of patient, obtaining history from patient or surrogate, ordering and performing treatments and interventions, ordering and review of laboratory studies, ordering and review of radiographic studies, pulse oximetry and re-evaluation of patient's condition. Signed, Soyla DELENA [employer]ck, MD

## 2023-12-13 ENCOUNTER — Encounter (HOSPITAL_COMMUNITY)
Admission: EM | Disposition: A | Discharge status: Hospice | Payer: Self-pay | Source: Home / Self Care | Attending: Internal Medicine

## 2023-12-13 DIAGNOSIS — I50810 Right heart failure, unspecified: Secondary | ICD-10-CM | POA: Diagnosis not present

## 2023-12-13 DIAGNOSIS — J9601 Acute respiratory failure with hypoxia: Secondary | ICD-10-CM | POA: Diagnosis not present

## 2023-12-13 DIAGNOSIS — I272 Pulmonary hypertension, unspecified: Secondary | ICD-10-CM

## 2023-12-13 HISTORY — PX: RIGHT HEART CATH: CATH118263

## 2023-12-13 LAB — POCT I-STAT EG7
Acid-Base Excess: 10 mmol/L — ABNORMAL HIGH (ref 0.0–2.0)
Acid-Base Excess: 10 mmol/L — ABNORMAL HIGH (ref 0.0–2.0)
Bicarbonate: 37.8 mmol/L — ABNORMAL HIGH (ref 20.0–28.0)
Bicarbonate: 38.2 mmol/L — ABNORMAL HIGH (ref 20.0–28.0)
Calcium, Ion: 1.23 mmol/L (ref 1.15–1.40)
Calcium, Ion: 1.27 mmol/L (ref 1.15–1.40)
HCT: 39 % (ref 36.0–46.0)
HCT: 39 % (ref 36.0–46.0)
Hemoglobin: 13.3 g/dL (ref 12.0–15.0)
Hemoglobin: 13.3 g/dL (ref 12.0–15.0)
O2 Saturation: 66 %
O2 Saturation: 68 %
Potassium: 4.2 mmol/L (ref 3.5–5.1)
Potassium: 4.2 mmol/L (ref 3.5–5.1)
Sodium: 135 mmol/L (ref 135–145)
Sodium: 135 mmol/L (ref 135–145)
TCO2: 40 mmol/L — ABNORMAL HIGH (ref 22–32)
TCO2: 40 mmol/L — ABNORMAL HIGH (ref 22–32)
pCO2, Ven: 66.1 mmHg — ABNORMAL HIGH (ref 44–60)
pCO2, Ven: 66.6 mmHg — ABNORMAL HIGH (ref 44–60)
pH, Ven: 7.366 (ref 7.25–7.43)
pH, Ven: 7.367 (ref 7.25–7.43)
pO2, Ven: 37 mmHg (ref 32–45)
pO2, Ven: 38 mmHg (ref 32–45)

## 2023-12-13 LAB — CBC WITH DIFFERENTIAL/PLATELET
Abs Immature Granulocytes: 0.08 K/uL — ABNORMAL HIGH (ref 0.00–0.07)
Basophils Absolute: 0 K/uL (ref 0.0–0.1)
Basophils Relative: 0 %
Eosinophils Absolute: 0 K/uL (ref 0.0–0.5)
Eosinophils Relative: 0 %
HCT: 34.8 % — ABNORMAL LOW (ref 36.0–46.0)
Hemoglobin: 10.2 g/dL — ABNORMAL LOW (ref 12.0–15.0)
Immature Granulocytes: 1 %
Lymphocytes Relative: 3 %
Lymphs Abs: 0.5 K/uL — ABNORMAL LOW (ref 0.7–4.0)
MCH: 22 pg — ABNORMAL LOW (ref 26.0–34.0)
MCHC: 29.3 g/dL — ABNORMAL LOW (ref 30.0–36.0)
MCV: 75 fL — ABNORMAL LOW (ref 80.0–100.0)
Monocytes Absolute: 0.8 K/uL (ref 0.1–1.0)
Monocytes Relative: 5 %
Neutro Abs: 13.5 K/uL — ABNORMAL HIGH (ref 1.7–7.7)
Neutrophils Relative %: 91 %
Platelets: 132 K/uL — ABNORMAL LOW (ref 150–400)
RBC: 4.64 MIL/uL (ref 3.87–5.11)
RDW: 20.7 % — ABNORMAL HIGH (ref 11.5–15.5)
WBC: 14.9 K/uL — ABNORMAL HIGH (ref 4.0–10.5)
nRBC: 0 % (ref 0.0–0.2)

## 2023-12-13 LAB — COOXEMETRY PANEL
Carboxyhemoglobin: 2 % — ABNORMAL HIGH (ref 0.5–1.5)
Methemoglobin: 1.1 % (ref 0.0–1.5)
O2 Saturation: 76.5 %
Total hemoglobin: 11.5 g/dL — ABNORMAL LOW (ref 12.0–16.0)

## 2023-12-13 LAB — GLUCOSE, CAPILLARY
Glucose-Capillary: 141 mg/dL — ABNORMAL HIGH (ref 70–99)
Glucose-Capillary: 201 mg/dL — ABNORMAL HIGH (ref 70–99)
Glucose-Capillary: 219 mg/dL — ABNORMAL HIGH (ref 70–99)

## 2023-12-13 LAB — BASIC METABOLIC PANEL WITH GFR
Anion gap: 13 (ref 5–15)
BUN: 85 mg/dL — ABNORMAL HIGH (ref 8–23)
CO2: 35 mmol/L — ABNORMAL HIGH (ref 22–32)
Calcium: 10.2 mg/dL (ref 8.9–10.3)
Chloride: 90 mmol/L — ABNORMAL LOW (ref 98–111)
Creatinine, Ser: 2.86 mg/dL — ABNORMAL HIGH (ref 0.44–1.00)
GFR, Estimated: 17 mL/min — ABNORMAL LOW (ref >60)
Glucose, Bld: 168 mg/dL — ABNORMAL HIGH (ref 70–99)
Potassium: 3.9 mmol/L (ref 3.5–5.1)
Sodium: 138 mmol/L (ref 135–145)

## 2023-12-13 LAB — RETICULOCYTES
Immature Retic Fract: 6.4 % (ref 2.3–15.9)
RBC.: 4.81 MIL/uL (ref 3.87–5.11)
Retic Count, Absolute: 7.2 K/uL — ABNORMAL LOW (ref 19.0–186.0)
Retic Ct Pct: <0.4 % — ABNORMAL LOW (ref 0.4–3.1)

## 2023-12-13 LAB — PROTIME-INR
INR: 1.1 (ref 0.8–1.2)
Prothrombin Time: 15.1 s (ref 11.4–15.2)

## 2023-12-13 LAB — APTT: aPTT: 36 s (ref 24–36)

## 2023-12-13 MED ORDER — STERILE WATER FOR INJECTION IJ SOLN
INTRAMUSCULAR | Status: AC
  Filled 2023-12-13: qty 10

## 2023-12-13 MED ORDER — HEPARIN (PORCINE) IN NACL 1000-0.9 UT/500ML-% IV SOLN
INTRAVENOUS | Status: DC | PRN
  Administered 2023-12-13: 500 mL

## 2023-12-13 MED ORDER — ACETAZOLAMIDE SODIUM 500 MG IJ SOLR
500.0000 mg | Freq: Once | INTRAMUSCULAR | Status: AC
  Administered 2023-12-13: 500 mg via INTRAVENOUS
  Filled 2023-12-13: qty 500

## 2023-12-13 MED ORDER — FUROSEMIDE 10 MG/ML IJ SOLN: 80.0000 mg | Freq: Every day | INTRAMUSCULAR | Status: DC

## 2023-12-13 MED ORDER — FUROSEMIDE 10 MG/ML IJ SOLN: 80.0000 mg | Freq: Two times a day (BID) | INTRAMUSCULAR | Status: DC

## 2023-12-13 MED ORDER — ZINC OXIDE 40 % EX OINT
TOPICAL_OINTMENT | CUTANEOUS | Status: DC | PRN
  Filled 2023-12-13 (×2): qty 57

## 2023-12-13 MED ORDER — FUROSEMIDE 10 MG/ML IJ SOLN
15.0000 mg/h | INTRAVENOUS | Status: DC
  Administered 2023-12-13 – 2023-12-14 (×2): 15 mg/h via INTRAVENOUS
  Filled 2023-12-13 (×2): qty 20

## 2023-12-13 MED ORDER — LIDOCAINE HCL (PF) 1 % IJ SOLN
INTRAMUSCULAR | Status: DC | PRN
  Administered 2023-12-13: 2 mL via INTRADERMAL

## 2023-12-13 NOTE — Progress Notes (Addendum)
  Progress Note  Patient Name: [name] [name] Date of Encounter: 12/13/2023 [facility] Cardiologist: Jerel Balding, MD   Interval Summary   Reports that she is feeling much better. Improvement in shortness of breath and peripheral edema. Is having some mild discomfort in her legs now though. Denied lightheadedness/dizziness, chest pain and abdominal pain.   Vital Signs Vitals:   12/13/23 0740 12/13/23 0743 12/13/23 0800 12/13/23 1000  BP:   (!) 127/53 (!) 109/51  Pulse:   (!) 101 (!) 110  Resp:   15 16  Temp:  98.2 F (36.8 C)    TempSrc:  Axillary    SpO2: 92% 92% 90% 94%  Weight:      Height:        Intake/Output Summary (Last 24 hours) at 12/13/2023 1036 Last data filed at 12/13/2023 0800 Gross per 24 hour  Intake 243.76 ml  Output 2525 ml  Net -2281.24 ml      12/13/2023    5:00 AM 12/12/2023    5:00 AM 12/11/2023    4:00 AM  Last 3 Weights  Weight (lbs) 272 lb 0.8 oz 270 lb 11.6 oz 283 lb 1.1 oz  Weight (kg) 123.4 kg 122.8 kg 128.4 kg      Telemetry/ECG  Sinus Tachycardia HR 106, infrequent ectopy [one episode of PVCs] - Personally Reviewed  Physical Exam  GEN: No acute distress.   Neck: unable to assess JVD with body habitus Cardiac: RRR, no murmurs, rubs, or gallops.  Respiratory: Clear to auscultation bilaterally though diminished GI: Soft, nontender, non-distended  MS: No edema  Assessment & Plan  Patient is a 68 year old female with pmhx of insulin  dependent T2DM, HFpEF with Cor Pulmonale, morbid obesity, severe deconditioning [bed bound], and hypertension who presented to [facility] ED on 10/8 for peripheral edema and weight gain with discomfort to the left side of the body especially the left breast. Pro-BNP 1820. Troponin 39. Patient was given IV diuresis and started in BiPAP. Patient was admitted with  Cardiology consulted.    Acute on Chronic Heart Failure (Cor Pulmonale) RV failure 2/2 sleep apnea, hypoxemia, obesity hypoventilation.  Continue to defer RHC unless clinical change.  Net IO Since Admission: -14,861.82 mL [12/13/23 1037] Currently -25lbs since admission  On exam she appears euvolemic, however her body habitus makes volume status assessment difficult. Co-ox 76.5%  CVP 16, confirmed Flat. Cr bump 2.63 -> 2.86 Would like to continue to scale down diuresis/inotrope as possible. Metolazone discontinued this am 2/2 renal function.  CVP is 16, though trend very labile and may not be very reliable, most likely due to PICC.    Continue milrinone  Continue IV lasix  80 mg BID Continue K supplementation as needed GDMT limited by renal function and hypotension on midodrine    Hypotension BP: 109/51 Patient was previously on midodrine , though discontinued for supine hypertension. Patient is bed bound. Agree with discontinuation.    Hyperlipidemia Continue lipitor 10 mg   Per primary Left breast cellulitis Anisocoria T2DM [A1c 8.8 %] CKD [Baseline Cr 2.1- 2.6 ] Chronic respiratory failure with hypoxia OHS and OSA Morbid Obesity Deconditioning     For questions or updates, please contact [facility] Please consult [email or web address] for contact info under       Signed, Leontine LOISE Salen, PA-C

## 2023-12-13 NOTE — Interval H&P Note (Signed)
 History and Physical Interval Note:  12/13/2023 3:01 PM  [name] [name]  has presented today for surgery, with the diagnosis of heart failure.  The various methods of treatment have been discussed with the patient and family. After consideration of risks, benefits and other options for treatment, the patient has consented to  Procedure(s): RIGHT HEART CATH (N/A) as a surgical intervention.  The patient's history has been reviewed, patient examined, no change in status, stable for surgery.  I have reviewed the patient's chart and labs.  Questions were answered to the patient's satisfaction.     Dhiya Smits [employer]

## 2023-12-13 NOTE — Progress Notes (Signed)
Placed patient on bipap for the night with oxygen set at 5lpm  

## 2023-12-13 NOTE — Progress Notes (Signed)
 [facility] Progress Note Patient: [name] [name] FMW:[id number] DOB: 1955-03-17  DOA: 11/29/2023 DOS: the patient was seen and examined on 12/13/2023  Brief Hospital Course: [name] [name] is a 68 y.o. female with medical history significant for insulin -dependent type 2 diabetes, chronic heart failure with preserved EF, morbid obesity, bedbound, hypertension being admitted to the hospital with weight gain and peripheral edema due to suspected acute exacerbation of chronic heart failure with preserved EF.  Cardiology consulted.  Patient also with concerns for left breast cellulitis.  General surgery consulted.  Patient subsequently placed on a Lasix  drip as well as milrinone for diuresis per cardiology and PICC line placed.  At this time cardiology following.  Still on IV diuresis and Primacor.  Transferred to [facility] for right heart cath.   Assessment and plan. Acute on chronic heart failure with preserved EF. Uses 5 LPM at baseline.  Presents with shortness of breath. Echocardiogram showed EF 6065%.  No valvular abnormality. Received IV Lasix  and metolazone initially. Cardiology was consulted Patient with minimal urine output despite aggressive diuresis and now on Primacor drip and Lasix  drip. Per cardiology, patient is not a candidate for advanced heart failure treatment and RHC would not add value to her care. Nephrology was also consulted for volume management but no need for CRRT.  Off of BiPAP. Remains negative balance over last 24 hours. For now plan is continuing diuresis with IV Lasix , Zaroxolyn, Primacor and midodrine . Cardiology transferring the patient to [facility] for right heart cath. Whether the patient will stay at [facility] or return back to [facility] will defer to cardiology.   Acute hypoxic respiratory failure secondary to hypoxia as well as hypercarbia. Respiratory acidosis. Acute COPD exacerbation secondary  pneumonia Initial ABG showed pCO2 of 73.  Improving with BiPAP nightly. On Solu-Medrol  Pulmicort Brovana and DuoNeb, Claritin  PPI and BiPAP as needed nightly. Treated with Zyvox  and cefazolin .   Hypokalemia.   Replaced.   Diarrhea.  Intermittent. Uncertain etiology.  Continue probiotic and rectal tube. GI pathogen panel negative.   OSA with right-sided heart failure in the setting of suspected pulmonary hypertension/obesity hypoventilation syndrome Continue BiPAP at night   AKI on CKD stage 4 Baseline creatinine around 2.1. Upon admission serum creatinine was 1.9.  Current serum creatinine trending up. Multifactorial etiology. Nephrology was consulted.  No CRRT at this time.   Per nephrology patient not a candidate for long-term hemodialysis.  Nephrology signed off 10/18. On milrinone drip, midodrine  and IV diuretics.   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 On sliding scale insulin  only. Used to be on Lantus .  Currently stopped. Hyperglycemia secondary to IV steroids.   Diabetic neuropathy. On Lyrica .  Dose adjusted for renal dysfunction and mentation.   Left breast cellulitis versus breast swelling secondary to dependent edema Treated with linezolid , completed 7-day treatment course. General Surgery was consulted, recommended nursing place several folded towels under left breast to keep it elevated.   Toxic metabolic encephalopathy secondary to hypoxia, hypercarbia as well as Lyrica . Mentation improving after adjusting Lyrica  dose and BiPAP therapy. Improved, communicative and oriented.   Chronic hypotension Currently on midodrine , milrinone and Lasix .    Hyperlipidemia Continue Lipitor   Obesity Class 3 Body mass index is 49.52 kg/m (pended).  Placing the pt at higher risk of poor outcomes.   Bedbound  due to previous history of lower extremity fracture.   Anxiety/depression Continue Zoloft.      Concern  for vaginal fistula-ruled out Bedside RN noted stool coming out from vagina confirmed by the patient as well.  CT abdomen and pelvis without contrast without any evidence of vaginal fistula. Shows evidence of pneumonia.   Lower extremity edema. Doppler ordered to rule out DVT.  Patient chronically bedbound.  Anisocoria. Left pupil more than right. CT scan of the head negative for any acute abnormality.  No other focal deficit.  Monitor.   Hematuria. Started last night on 10/21.  Mild.  Pink tinge in the urine only. H&H relatively stable. Monitor closely. Suspected trauma.  If persist will require further workup.  Subjective: Denies any other complaint.  No nausea no vomiting fever no chills.  Breathing okay.  Physical Exam: Basal crackles. Bowel sound present. Trace edema bilaterally. Nontender abdomen. No focal deficit.  Data Reviewed: I have Reviewed nursing notes, Vitals, and Lab results. Since last encounter, pertinent lab results CBC and BMP   . I have ordered test including CBC and BMP  . I have discussed pt's care plan and test results with cardiology  .   Disposition: Status is: Inpatient Remains inpatient appropriate because: Requiring management of heart failure with inotropes.  heparin  injection 5,000 Units Start: 11/29/23 2200 amily Communication: No family at bedside Level of care: ICU   Vitals:   12/13/23 1615 12/13/23 1630 12/13/23 1646 12/13/23 1714  BP: 108/79 93/80 (!) 88/49 117/64  Pulse: (!) 114 (!) 110 (!) 110 (!) 111  Resp: (!) 27 16 13 18   Temp:      TempSrc:      SpO2: 99% 95% (!) 89% 97%  Weight:      Height:         Author: Yetta Blanch, MD 12/13/2023 5:29 PM  Please look on [email or web address] to find out who is on call.
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
## 2023-12-13 NOTE — Plan of Care (Signed)
  Problem: Coping: Goal: Ability to adjust to condition or change in health will improve Outcome: Progressing   Problem: Fluid Volume: Goal: Ability to maintain a balanced intake and output will improve Outcome: Progressing   Problem: Education: Goal: Knowledge of General Education information will improve Description: Including pain rating scale, medication(s)/side effects and non-pharmacologic comfort measures Outcome: Progressing   

## 2023-12-13 NOTE — Progress Notes (Signed)
 I met with [name] [name] to provide emotional and spiritual support.  She was so grateful to have a chance to talk and shared about her family as well as some past events that were significant for her. She had additional things that she wanted to share, but our conversation was cut short because I was paged to a different patient and was unable to return to her room.  I will plan to check in tomorrow for follow up support.

## 2023-12-13 NOTE — Plan of Care (Signed)
  Problem: Nutritional: Goal: Maintenance of adequate nutrition will improve Outcome: Progressing   Problem: Elimination: Goal: Will not experience complications related to bowel motility Outcome: Progressing   Problem: Skin Integrity: Goal: Risk for impaired skin integrity will decrease Outcome: Not Progressing   Problem: Skin Integrity: Goal: Risk for impaired skin integrity will decrease Outcome: Not Progressing

## 2023-12-14 ENCOUNTER — Encounter (HOSPITAL_COMMUNITY): Payer: Self-pay | Admitting: Cardiology

## 2023-12-14 DIAGNOSIS — I50810 Right heart failure, unspecified: Secondary | ICD-10-CM | POA: Diagnosis not present

## 2023-12-14 DIAGNOSIS — I272 Pulmonary hypertension, unspecified: Secondary | ICD-10-CM | POA: Diagnosis not present

## 2023-12-14 DIAGNOSIS — J9601 Acute respiratory failure with hypoxia: Secondary | ICD-10-CM | POA: Diagnosis not present

## 2023-12-14 LAB — GLUCOSE, CAPILLARY
Glucose-Capillary: 172 mg/dL — ABNORMAL HIGH (ref 70–99)
Glucose-Capillary: 221 mg/dL — ABNORMAL HIGH (ref 70–99)
Glucose-Capillary: 276 mg/dL — ABNORMAL HIGH (ref 70–99)
Glucose-Capillary: 293 mg/dL — ABNORMAL HIGH (ref 70–99)

## 2023-12-14 LAB — COOXEMETRY PANEL
Carboxyhemoglobin: 1.8 % — ABNORMAL HIGH (ref 0.5–1.5)
Methemoglobin: <0.7 % (ref 0.0–1.5)
O2 Saturation: 73 %
Total hemoglobin: 10.4 g/dL — ABNORMAL LOW (ref 12.0–16.0)

## 2023-12-14 LAB — BASIC METABOLIC PANEL WITH GFR
Anion gap: 15 (ref 5–15)
BUN: 90 mg/dL — ABNORMAL HIGH (ref 8–23)
CO2: 33 mmol/L — ABNORMAL HIGH (ref 22–32)
Calcium: 9.5 mg/dL (ref 8.9–10.3)
Chloride: 91 mmol/L — ABNORMAL LOW (ref 98–111)
Creatinine, Ser: 3.25 mg/dL — ABNORMAL HIGH (ref 0.44–1.00)
GFR, Estimated: 15 mL/min — ABNORMAL LOW (ref >60)
Glucose, Bld: 166 mg/dL — ABNORMAL HIGH (ref 70–99)
Potassium: 4.1 mmol/L (ref 3.5–5.1)
Sodium: 139 mmol/L (ref 135–145)

## 2023-12-14 MED ORDER — CALCIUM POLYCARBOPHIL 625 MG PO TABS
625.0000 mg | ORAL_TABLET | Freq: Every day | ORAL | Status: DC
  Administered 2023-12-14 – 2023-12-26 (×13): 625 mg via ORAL
  Filled 2023-12-14 (×15): qty 1

## 2023-12-14 MED ORDER — POTASSIUM CHLORIDE CRYS ER 20 MEQ PO TBCR
20.0000 meq | EXTENDED_RELEASE_TABLET | Freq: Once | ORAL | Status: AC
  Administered 2023-12-14: 20 meq via ORAL
  Filled 2023-12-14: qty 1

## 2023-12-14 MED ORDER — PREDNISONE 20 MG PO TABS
20.0000 mg | ORAL_TABLET | Freq: Every day | ORAL | Status: DC
  Administered 2023-12-15: 20 mg via ORAL
  Filled 2023-12-14: qty 1

## 2023-12-14 NOTE — Progress Notes (Signed)
 PROGRESS NOTE    [name] [name]  FMW:[id number] DOB: 10-21-1955 DOA: 11/29/2023 PCP: [facility], [facility]   68/F morbidly obese with OSA OHS, diastolic CHF, pulmonary hypertension, RV failure, CKD 4, bedbound >admitted to [facility] with CHF exacerbation, complicated by low output started on milrinone and Lasix  gtt.  Subjective: Patient seen and examined, Dr. Anthony notes reviewed, feels well, breathing is improving  Assessment and Plan:  Acute on chronic diastolic CHF, RV failure Severe pulmonary hypertension -Echo this admission with EF 60-65, mildly reduced RV, severely elevated PASP -Heart failure team following, diuresing with Lasix  gtt. and milrinone for RV support -RHC 10/22 noted significantly elevated RV filling pressures, wedge only 12 -Volume status has improved, Lasix  discontinued today -Continuing milrinone with worsening AKI -GDMT limited by AKI/CKD 4  Acute hypoxic and hypercarbic respiratory failure OSA OHS, morbid obesity with BMI of 49 -On 4 to 5 L O2 at baseline Initial ABG showed pCO2 of 73.  Improving with BiPAP nightly. -Clinically do not suspect COPD exacerbation will DC IV Solu-Medrol , add prednisone for 2 days and then discontinue  - Continue Pulmicort Brovana DuoNeb, Claritin   - Emphasized importance of BiPAP nightly    Hypokalemia.   Replaced.   Diarrhea.  Intermittent. Uncertain etiology.  Add fiber, discontinue rectal tube GI pathogen panel negative.   AKI on CKD stage 4 Baseline creatinine around 2.1. 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 -Continue Lantus , SSI, CBGs are stable   Diabetic neuropathy. On Lyrica .  Dose adjusted for renal dysfunction and mentation.   Left breast cellulitis versus breast swelling secondary to dependent edema Treated with linezolid , completed 7-day  treatment course.  Improved General Surgery was consulted, recommended nursing place several folded towels under left breast to keep it elevated.   Toxic metabolic encephalopathy secondary to hypoxia, hypercarbia as well as Lyrica . Mentation improving after adjusting Lyrica  dose and BiPAP therapy. Improved, communicative and oriented.   Chronic hypotension Currently on midodrine , milrinone and Lasix .    Hyperlipidemia Continue Lipitor   Obesity Class 3 Body mass index is 49.52 kg/m (pended).  Placing the pt at higher risk of poor outcomes.   Bedbound  due to previous history of lower extremity fracture.   Anxiety/depression Continue Zoloft.     Anisocoria. Left pupil more than right. CT scan of the head negative for any acute abnormality.  No other focal deficit.  Monitor.    DVT prophylaxis: Heparin  subcutaneous Code Status: Full code Family Communication: None present Disposition Plan: Back home likely 48 hours  Consultants:    Procedures:   Antimicrobials:    Objective: Vitals:   12/14/23 0300 12/14/23 0442 12/14/23 0757 12/14/23 0841  BP:  (!) 107/51  (!) 117/52  Pulse:  91 98   Resp:  16  15  Temp:  98.8 F (37.1 C)  98.7 F (37.1 C)  TempSrc:  Axillary  Oral  SpO2:  96%  96%  Weight: 123.4 kg     Height:        Intake/Output Summary (Last 24 hours) at 12/14/2023 0949 Last data filed at 12/14/2023 0905 Gross per 24 hour  Intake 434.36 ml  Output 1185 ml  Net -750.64 ml   Filed Weights   12/12/23 0500 12/13/23 0500 12/14/23 0300  Weight: 122.8 kg 123.4 kg 123.4 kg    Examination:  General exam: Appears calm and comfortable  Respiratory system: Clear to  auscultation Cardiovascular system: S1 & S2 heard, RRR.  Abd: nondistended, soft and nontender.Normal bowel sounds heard. Central nervous system: Alert and oriented. No focal neurological deficits. Extremities: no edema Skin: No rashes Psychiatry:  Mood & affect appropriate.     Data  Reviewed:   CBC: Recent Labs  Lab 12/08/23 0532 12/09/23 1234 12/10/23 0445 12/13/23 1031 12/13/23 1522 12/13/23 1523  WBC 4.3 8.0 7.0 14.9*  --   --   NEUTROABS  --  7.3  --  13.5*  --   --   HGB 11.3* 10.5* 10.1* 10.2* 13.3 13.3  HCT 39.9 38.0 35.9* 34.8* 39.0 39.0  MCV 77.2* 76.0* 75.6* 75.0*  --   --   PLT 282 229 199 132*  --   --    Basic Metabolic Panel: Recent Labs  Lab 12/08/23 0532 12/09/23 1234 12/10/23 0445 12/11/23 0847 12/12/23 0426 12/13/23 0507 12/13/23 1522 12/13/23 1523 12/14/23 0638  NA 136 132* 133* 134* 136 138 135 135 139  K 3.3* 3.8 3.8 3.8 3.5 3.9 4.2 4.2 4.1  CL 91* 89* 89* 88* 90* 90*  --   --  91*  CO2 32 32 33* 34* 33* 35*  --   --  33*  GLUCOSE 283* 276* 343* 227* 180* 168*  --   --  166*  BUN 51* 64* 69* 73* 77* 85*  --   --  90*  CREATININE 2.66* 2.79* 3.20* 2.74* 2.63* 2.86*  --   --  3.25*  CALCIUM  10.3 9.8 9.7 9.8 9.9 10.2  --   --  9.5  MG 2.1 1.9 2.1  --   --   --   --   --   --   PHOS 3.2  --  3.6  --   --   --   --   --   --    GFR: Estimated Creatinine Clearance: 20.4 mL/min (A) (by C-G formula based on SCr of 3.25 mg/dL (H)). Liver Function Tests: Recent Labs  Lab 12/09/23 1234 12/10/23 0445  AST <10*  --   ALT <5  --   ALKPHOS 139*  --   BILITOT 0.5  --   PROT 6.6  --   ALBUMIN  3.6 3.6   No results for input(s): LIPASE, AMYLASE in the last 168 hours. No results for input(s): AMMONIA in the last 168 hours. Coagulation Profile: Recent Labs  Lab 12/13/23 1031  INR 1.1   Cardiac Enzymes: No results for input(s): CKTOTAL, CKMB, CKMBINDEX, TROPONINI in the last 168 hours. BNP (last 3 results) Recent Labs    11/29/23 1531 11/30/23 0300 12/11/23 1114  PROBNP 1,820.0* 1,624.0* 5,609.0*   HbA1C: No results for input(s): HGBA1C in the last 72 hours. CBG: Recent Labs  Lab 12/12/23 2147 12/13/23 0722 12/13/23 1145 12/13/23 2125 12/14/23 0650  GLUCAP 163* 141* 201* 219* 172*   Lipid  Profile: No results for input(s): CHOL, HDL, LDLCALC, TRIG, CHOLHDL, LDLDIRECT in the last 72 hours. Thyroid Function Tests: No results for input(s): TSH, T4TOTAL, FREET4, T3FREE, THYROIDAB in the last 72 hours. Anemia Panel: Recent Labs    12/13/23 1031  RETICCTPCT <0.4*   Urine analysis:    Component Value Date/Time   COLORURINE YELLOW 03/27/2023 1717   APPEARANCEUR HAZY (A) 03/27/2023 1717   LABSPEC 1.014 03/27/2023 1717   PHURINE 5.0 03/27/2023 1717   GLUCOSEU NEGATIVE 03/27/2023 1717   HGBUR NEGATIVE 03/27/2023 1717   BILIRUBINUR NEGATIVE 03/27/2023 1717   KETONESUR NEGATIVE 03/27/2023 1717  PROTEINUR NEGATIVE 03/27/2023 1717   UROBILINOGEN 0.2 06/28/2014 1500   NITRITE NEGATIVE 03/27/2023 1717   LEUKOCYTESUR SMALL (A) 03/27/2023 1717   Sepsis Labs: @LABRCNTIP (procalcitonin:4,lacticidven:4)  ) Recent Results (from the past 240 hours)  Gastrointestinal Panel by PCR , Stool     Status: None   Collection Time: 12/07/23  1:12 PM   Specimen: Stool  Result Value Ref Range Status   Campylobacter species NOT DETECTED NOT DETECTED Final   Plesimonas shigelloides NOT DETECTED NOT DETECTED Final   Salmonella species NOT DETECTED NOT DETECTED Final   Yersinia enterocolitica NOT DETECTED NOT DETECTED Final   Vibrio species NOT DETECTED NOT DETECTED Final   Vibrio cholerae NOT DETECTED NOT DETECTED Final   Enteroaggregative E coli (EAEC) NOT DETECTED NOT DETECTED Final   Enteropathogenic E coli (EPEC) NOT DETECTED NOT DETECTED Final   Enterotoxigenic E coli (ETEC) NOT DETECTED NOT DETECTED Final   Shiga like toxin producing E coli (STEC) NOT DETECTED NOT DETECTED Final   Shigella/Enteroinvasive E coli (EIEC) NOT DETECTED NOT DETECTED Final   Cryptosporidium NOT DETECTED NOT DETECTED Final   Cyclospora cayetanensis NOT DETECTED NOT DETECTED Final   Entamoeba histolytica NOT DETECTED NOT DETECTED Final   Giardia lamblia NOT DETECTED NOT DETECTED Final    Adenovirus F40/41 NOT DETECTED NOT DETECTED Final   Astrovirus NOT DETECTED NOT DETECTED Final   Norovirus GI/GII NOT DETECTED NOT DETECTED Final   Rotavirus A NOT DETECTED NOT DETECTED Final   Sapovirus (I, II, IV, and V) NOT DETECTED NOT DETECTED Final    Comment: Performed at [facility], [address], [place], [place] [address]     Radiology Studies: CARDIAC CATHETERIZATION Result Date: 12/13/2023 1. Elevated RA pressure, primarily RV failure. 2. Normal PCWP. 3. Moderate-severe pulmonary arterial hypertension 4. Preserved cardiac output on milrinone. Continue diuresis, continue milrinone for RV support.   CT HEAD WO CONTRAST ( ) Result Date: 12/12/2023 EXAM: CT HEAD WITHOUT CONTRAST 12/12/2023 11:54:56 AM TECHNIQUE: CT of the head was performed without the administration of intravenous contrast. Automated exposure control, iterative reconstruction, and/or weight based adjustment of the mA/kV was utilized to reduce the radiation dose to as low as reasonably achievable. COMPARISON: Prior head CT 09/25/2021. CLINICAL HISTORY: 68 year old female with anisocoria. FINDINGS: BRAIN AND VENTRICLES: Normal brain volume for age. No acute hemorrhage. No evidence of acute infarct. No hydrocephalus. No extra-axial collection. No mass effect or midline shift. Mild chronic basal ganglia vascular calcifications. Small area of right superior frontal gyrus chronic appearing cortical and subchondral lesions new on coronal image 40, consistent with chronic ischemic changes in the right MCA territory. Hypodensity in the right frontal pole on series 2 image 18 is new and appears more age indeterminate, consistent with ischemic changes in the right PCA territory. Otherwise scattered cerebral white matter hypodensity is mild and stable. No suspicious intracranial vascular hyperdensity. ORBITS: Postoperative changes to both globes. SINUSES: Chronic but progressed opacification of the left middle ear and  mastoids. Visible nasopharynx appears stable and negative. Bilateral paranasal sinuses mucosal thickening and bubbly opacity has regressed. Right middle ear and mastoids remain clear. SOFT TISSUES AND SKULL: No acute soft tissue abnormality. No skull fracture. Mild leftward gaze. Advanced calcified atherosclerosis at the skull base. IMPRESSION: 1. Positive for progressed ischemic disease in the right hemisphere since 2023, age indeterminate in the Right PCA territory, appears chronic in the right MCA territory. 2. No acute intracranial hemorrhage or mass effect. 3. Progressed chronic left middle ear and mastoid opacification. But paranasal  sinus aeration improved. Electronically signed by: Helayne Hurst MD 12/12/2023 12:19 PM EDT RP Workstation: [id number]     Scheduled Meds:  acidophilus  2 capsule Oral TID   allopurinol   100 mg Oral QHS   alteplase  2 mg Intracatheter Once   arformoterol  15 mcg Nebulization BID   atorvastatin   10 mg Oral QHS   budesonide (PULMICORT) nebulizer solution  0.5 mg Nebulization BID   calcitRIOL  0.25 mcg Oral Q M,W,F   Chlorhexidine  Gluconate Cloth  6 each Topical Daily   diclofenac Sodium  2 g Topical QID   fluticasone  2 spray Each Nare Daily   heparin   5,000 Units Subcutaneous Q8H   insulin  aspart  0-20 Units Subcutaneous TID WC   insulin  aspart  0-5 Units Subcutaneous QHS   insulin  glargine-yfgn  35 Units Subcutaneous Daily   ipratropium-albuterol   3 mL Nebulization BID   loratadine   10 mg Oral Daily   methylPREDNISolone  (SOLU-MEDROL ) injection  60 mg Intravenous Q24H   nystatin  cream  1 Application Topical BID   oxyCODONE   10 mg Oral BID   pantoprazole   40 mg Oral QAC breakfast   polyethylene glycol  17 g Oral Daily   potassium chloride   20 mEq Oral Once   pregabalin   75 mg Oral Daily   sertraline  50 mg Oral q AM   sodium chloride  flush  10-40 mL Intracatheter Q12H   triamcinolone  1 Application Topical Daily   Continuous Infusions:  milrinone 0.25  mcg/kg/min (12/14/23 0905)     LOS: 15 days    Time spent:    Sigurd Pac, MD [facility]   12/14/2023, 9:49 AM

## 2023-12-14 NOTE — Progress Notes (Signed)
 OT Cancellation Note  Patient Details Name: [name] [name] MRN: [id number] DOB: 11/25/1955   Cancelled Treatment:    Reason Eval/Treat Not Completed: OT screened, no needs identified, will sign off. Pt bed bound and hoyer lift at baseline. Per RN pt can self-feed. No OT related concerns, will sign off on this pt.   Kania Regnier C, OT  Acute Rehabilitation Services Office [phone] Secure chat preferred   Adrianne GORMAN Savers 12/14/2023, 2:02 PM

## 2023-12-14 NOTE — TOC Initial Note (Addendum)
 Transition of Care [facility]) - Initial/Assessment Note    Patient Details  Name: [name] [name] Date of Birth: Jul 02, 1955  Transition of Care [facility]) CM/SW Contact:    [name] [name] Phone Number: [phone] 12/14/2023, 1:44 PM  Clinical Narrative:     HF CSW met with patient and sister at bedside. Patient stated that she lives alone. Patient stated that she has current [facility] services Through [employer]. Patient stated that she has [facility] services. Patient stated that she has a hospital bed, bipap, and oxygen . Patient stated that she has a PCP and is current with the [facility] for services. Family will provide transportation services at dc.   [place] 72hr authBETHA [name] [phone]  HF CSW/CM will continue to follow and monitor for dc readiness.                 Barriers to Discharge: Continued Medical Work up   Patient Goals and CMS Choice Patient states their goals for this hospitalization and ongoing recovery are:: return home   Choice offered to / list presented to : NA      Expected Discharge Plan and Services In-house Referral: NA Discharge Planning Services: NA   Living arrangements for the past 2 months: Apartment                 DME Arranged: N/A DME Agency: NA       HH Arranged: NA HH Agency: NA        Prior Living Arrangements/Services Living arrangements for the past 2 months: Apartment Lives with:: Adult Children Patient language and need for interpreter reviewed:: Yes Do you feel safe going back to the place where you live?: Yes      Need for Family Participation in Patient Care: Yes (Comment) Care giver support system in place?: Yes (comment) Current home services: Homehealth aide Criminal Activity/Legal Involvement Pertinent to Current Situation/Hospitalization: No - Comment as needed  Activities of Daily Living   ADL Screening (condition at time of admission) Independently performs ADLs?: No Does the patient have a  NEW difficulty with bathing/dressing/toileting/self-feeding that is expected to last >3 days?: No Does the patient have a NEW difficulty with getting in/out of bed, walking, or climbing stairs that is expected to last >3 days?: No Does the patient have a NEW difficulty with communication that is expected to last >3 days?: No Is the patient deaf or have difficulty hearing?: No Does the patient have difficulty seeing, even when wearing glasses/contacts?: No Does the patient have difficulty concentrating, remembering, or making decisions?: No  Permission Sought/Granted                  Emotional Assessment Appearance:: Appears stated age     Orientation: : Oriented to Situation, Oriented to  Time, Oriented to Place, Oriented to Self Alcohol  / Substance Use: Not Applicable Psych Involvement: No (comment)  Admission diagnosis:  Acute on chronic systolic congestive heart failure (HCC) [I50.23] Acute respiratory failure with hypoxia (HCC) [J96.01] Patient Active Problem List   Diagnosis Date Noted   Intermittent confusion 12/05/2023   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   COPD with acute exacerbation (HCC) 12/04/2023   Cellulitis of left breast 11/30/2023   Acute respiratory failure with hypoxia (HCC) 11/29/2023   Palliative care by specialist 04/02/2023   Goals of care, counseling/discussion 04/02/2023   Acute on chronic right heart failure (HCC) 04/01/2023   Obesity hypoventilation syndrome (HCC) 04/01/2023   Cardiogenic shock (HCC) 04/01/2023   Acute  on chronic respiratory failure with hypoxia and hypercapnia (HCC) 03/27/2023   Acute hypoxic respiratory failure (HCC) 02/16/2023   Acute on chronic hypoxic respiratory failure (HCC) 01/14/2023   Acute on chronic heart failure with preserved ejection fraction (HFpEF) (HCC) 01/13/2023   Chronic respiratory failure with hypoxia (HCC) 01/13/2023   Chronic kidney disease, stage 3a (HCC) 10/02/2022   Chronic kidney disease, stage 3b (HCC)  10/02/2022   Hyperglycemia due to type 2 diabetes mellitus (HCC) 09/26/2021   Type 2 diabetes mellitus with hyperglycemia, with long-term current use of insulin  (HCC) 09/25/2021   (HFpEF) heart failure with preserved ejection fraction (HCC) 09/25/2021   Hypomagnesemia 09/08/2021   Severe sepsis (HCC) 09/07/2021   Bacteremia due to Klebsiella pneumoniae 09/07/2021   Urinary tract infection 09/07/2021   Acute respiratory failure with hypoxia and hypercapnia (HCC) 09/04/2021   DKA (diabetic ketoacidosis) (HCC) 02/02/2021   Morbid obesity with BMI of 50.0-59.9, adult (HCC) 02/02/2021   Hyperglycemia 02/01/2021   Pancreatitis 09/07/2020   Intractable nausea and vomiting 09/07/2020   Functional quadriplegia (HCC) 09/07/2020   General weakness 08/26/2020   OSA (obstructive sleep apnea) 08/26/2020   Constipation    AKI (acute kidney injury) 08/25/2020   Acute renal failure superimposed on stage 3b chronic kidney disease (HCC) 07/30/2020   Lower extremity cellulitis 02/04/2020   Acute on chronic diastolic heart failure (HCC) 11/23/2019   Vaginal discharge 11/06/2019   Morbid obesity (HCC) 08/22/2019   Opiate overdose (HCC) 08/20/2019   Cellulitis 10/05/2018   Acute cystitis without hematuria 10/05/2018   Chronic hypotension 10/05/2018   Pressure injury of skin 10/05/2018   Hypoglycemia secondary to sulfonylurea 11/21/2016   Substance induced mood disorder (HCC) 07/02/2014   Acute metabolic encephalopathy    Hyperkalemia    Hypokalemia    Insulin  dependent type 2 diabetes mellitus (HCC)    Hyperlipidemia associated with type 2 diabetes mellitus (HCC)    Anxiety and depression    Anxiety state    PCP:  Clinic, Bon[facility] [name] Pharmacy:   DARRYLE LONG - [facility] [address] [place] [address] Phone: [phone] Fax: [phone]     Social Drivers of Health (SDOH) Social History: SDOH Screenings   Food Insecurity: No Food Insecurity (11/29/2023)   Housing: Low Risk  (11/29/2023)  Transportation Needs: No Transportation Needs (11/29/2023)  Utilities: Not At Risk (11/29/2023)  Alcohol  Screen: Low Risk  (03/29/2023)  Depression (PHQ2-9): Low Risk  (03/29/2023)  Financial Resource Strain: Patient Declined (05/16/2022)   Received from [facility]  Physical Activity: Inactive (03/29/2023)  Social Connections: Unknown (12/01/2023)  Stress: No Stress Concern Present (05/16/2022)   Received from [facility]  Tobacco Use: Medium Risk (12/01/2023)   SDOH Interventions:     Readmission Risk Interventions    12/02/2023   11:20 AM 03/28/2023   11:29 AM 02/23/2023   11:48 AM  Readmission Risk Prevention Plan  Transportation Screening Complete Complete Complete  PCP or Specialist Appt within 3-5 Days   Complete  HRI or Home Care Consult   Complete  Social Work Consult for Recovery Care Planning/Counseling   Complete  Palliative Care Screening   Not Applicable  Medication Review Oceanographer) Complete Complete Complete  PCP or Specialist appointment within 3-5 days of discharge Complete    HRI or Home Care Consult Complete Complete   SW Recovery Care/Counseling Consult Complete Complete   Palliative Care Screening Not Applicable Not Applicable   Skilled Nursing Facility Not Applicable Not Applicable

## 2023-12-14 NOTE — TOC Progression Note (Signed)
 Transition of Care [facility]) - Progression Note    Patient Details  Name: [name] [name] MRN: [id number] Date of Birth: [birth year]  Transition of Care [facility]) CM/SW Contact  Waddell Barnie Rama, RN Phone Number: 12/14/2023, 10:38 AM  Clinical Narrative:     NCM received call from April with [facility], stating patient goes to the [place], CSW is Carney Hilding [phone] with [facility], PCP is Ludivina Like NP.     Barriers to Discharge: Continued Medical Work up               Expected Discharge Plan and Services In-house Referral: NA Discharge Planning Services: NA   Living arrangements for the past 2 months: Apartment                 DME Arranged: N/A DME Agency: NA       HH Arranged: NA HH Agency: NA         Social Drivers of Health (SDOH) Interventions SDOH Screenings   Food Insecurity: No Food Insecurity (11/29/2023)  Housing: Low Risk  (11/29/2023)  Transportation Needs: No Transportation Needs (11/29/2023)  Utilities: Not At Risk (11/29/2023)  Alcohol  Screen: Low Risk  (03/29/2023)  Depression (PHQ2-9): Low Risk  (03/29/2023)  Financial Resource Strain: Patient Declined (05/16/2022)   Received from [facility]  Physical Activity: Inactive (03/29/2023)  Social Connections: Unknown (12/01/2023)  Stress: No Stress Concern Present (05/16/2022)   Received from [facility]  Tobacco Use: Medium Risk (12/01/2023)    Readmission Risk Interventions    12/02/2023   11:20 AM 03/28/2023   11:29 AM 02/23/2023   11:48 AM  Readmission Risk Prevention Plan  Transportation Screening Complete Complete Complete  PCP or Specialist Appt within 3-5 Days   Complete  HRI or Home Care Consult   Complete  Social Work Consult for Recovery Care Planning/Counseling   Complete  Palliative Care Screening   Not Applicable  Medication Review Oceanographer) Complete Complete Complete  PCP or Specialist appointment within 3-5 days of discharge Complete     HRI or Home Care Consult Complete Complete   SW Recovery Care/Counseling Consult Complete Complete   Palliative Care Screening Not Applicable Not Applicable   Skilled Nursing Facility Not Applicable Not Applicable

## 2023-12-14 NOTE — Progress Notes (Signed)
 Patient ID: [name] [name], female   DOB: [birth year], 68 y.o.   MRN: [id number]     Advanced Heart Failure Rounding Note  Cardiologist: Jerel Balding, MD  Chief Complaint: CHF Subjective:    Co-ox 73%, milrinone 0.25 ongoing.  Creatinie 2.86 => 3.25.  CVP 9-10 today.  UOP was not vigorous.   RHC Procedural Findings (milrinone 0.25), 10/22: Hemodynamics (mmHg) RA mean 17 RV 70/20 PA 67/31, mean 47 PCWP mean 12 Oxygen  saturations: PA 67% AO 100% Cardiac Output (Fick) 6.33  Cardiac Index (Fick) 2.91  PVR 5.5 WU    Objective:   Weight Range: 123.4 kg Body mass index is 49.76 kg/m (pended).   Vital Signs:   Temp:  [97.9 F (36.6 C)-98.8 F (37.1 C)] 98.8 F (37.1 C) (10/23 0442) Pulse Rate:  [0-119] 98 (10/23 0757) Resp:  [13-30] 16 (10/23 0442) BP: (74-152)/(28-80) 107/51 (10/23 0442) SpO2:  [83 %-100 %] 96 % (10/23 0442) FiO2 (%):  [36 %] 36 % (10/22 1934) Weight:  [123.4 kg] 123.4 kg (10/23 0300) Last BM Date : 12/13/23  Weight change: Filed Weights   12/12/23 0500 12/13/23 0500 12/14/23 0300  Weight: 122.8 kg 123.4 kg 123.4 kg    Intake/Output:   Intake/Output Summary (Last 24 hours) at 12/14/2023 0834 Last data filed at 12/14/2023 0759 Gross per 24 hour  Intake 319.88 ml  Output 1185 ml  Net -865.12 ml      Physical Exam    General:  Well appearing. No resp difficulty HEENT: Normal Neck: Thick. JVP difficult. Carotids 2+ bilat; no bruits. No lymphadenopathy or thyromegaly appreciated. Cor: PMI nondisplaced. Regular rate & rhythm. No rubs, gallops or murmurs. Lungs: Clear Abdomen: Soft, nontender, nondistended. No hepatosplenomegaly. No bruits or masses. Good bowel sounds. Extremities: No cyanosis, clubbing, rash, edema Neuro: Alert & orientedx3, cranial nerves grossly intact. moves all 4 extremities w/o difficulty. Affect pleasant   Telemetry   NSR 90s (personally reviewed)  Labs    CBC Recent Labs    12/13/23 1031 12/13/23 1522  12/13/23 1523  WBC 14.9*  --   --   NEUTROABS 13.5*  --   --   HGB 10.2* 13.3 13.3  HCT 34.8* 39.0 39.0  MCV 75.0*  --   --   PLT 132*  --   --    Basic Metabolic Panel Recent Labs    89/77/74 0507 12/13/23 1522 12/13/23 1523 12/14/23 0638  NA 138   < > 135 139  K 3.9   < > 4.2 4.1  CL 90*  --   --  91*  CO2 35*  --   --  33*  GLUCOSE 168*  --   --  166*  BUN 85*  --   --  90*  CREATININE 2.86*  --   --  3.25*  CALCIUM  10.2  --   --  9.5   < > = values in this interval not displayed.   Liver Function Tests No results for input(s): AST, ALT, ALKPHOS, BILITOT, PROT, ALBUMIN  in the last 72 hours. No results for input(s): LIPASE, AMYLASE in the last 72 hours. Cardiac Enzymes No results for input(s): CKTOTAL, CKMB, CKMBINDEX, TROPONINI in the last 72 hours.  BNP: BNP (last 3 results) Recent Labs    01/13/23 1811 02/15/23 2105 03/27/23 1622  BNP 334.2* 432.5* 462.5*    ProBNP (last 3 results) Recent Labs    11/29/23 1531 11/30/23 0300 12/11/23 1114  PROBNP 1,820.0* 1,624.0* 5,609.0*     D-Dimer  No results for input(s): DDIMER in the last 72 hours. Hemoglobin A1C No results for input(s): HGBA1C in the last 72 hours. Fasting Lipid Panel No results for input(s): CHOL, HDL, LDLCALC, TRIG, CHOLHDL, LDLDIRECT in the last 72 hours. Thyroid Function Tests No results for input(s): TSH, T4TOTAL, T3FREE, THYROIDAB in the last 72 hours.  Invalid input(s): FREET3  Other results:   Imaging    CARDIAC CATHETERIZATION Result Date: 12/13/2023 1. Elevated RA pressure, primarily RV failure. 2. Normal PCWP. 3. Moderate-severe pulmonary arterial hypertension 4. Preserved cardiac output on milrinone. Continue diuresis, continue milrinone for RV support.     Medications:     Scheduled Medications:  acidophilus  2 capsule Oral TID   allopurinol   100 mg Oral QHS   alteplase  2 mg Intracatheter Once   arformoterol  15  mcg Nebulization BID   atorvastatin   10 mg Oral QHS   budesonide (PULMICORT) nebulizer solution  0.5 mg Nebulization BID   calcitRIOL  0.25 mcg Oral Q M,W,F   Chlorhexidine  Gluconate Cloth  6 each Topical Daily   diclofenac Sodium  2 g Topical QID   fluticasone  2 spray Each Nare Daily   heparin   5,000 Units Subcutaneous Q8H   insulin  aspart  0-20 Units Subcutaneous TID WC   insulin  aspart  0-5 Units Subcutaneous QHS   insulin  glargine-yfgn  35 Units Subcutaneous Daily   ipratropium-albuterol   3 mL Nebulization BID   loratadine   10 mg Oral Daily   methylPREDNISolone  (SOLU-MEDROL ) injection  60 mg Intravenous Q24H   nystatin  cream  1 Application Topical BID   oxyCODONE   10 mg Oral BID   pantoprazole   40 mg Oral QAC breakfast   polyethylene glycol  17 g Oral Daily   potassium chloride   40 mEq Oral Daily   pregabalin   75 mg Oral Daily   sertraline  50 mg Oral q AM   sodium chloride  flush  10-40 mL Intracatheter Q12H   triamcinolone  1 Application Topical Daily    Infusions:  milrinone 0.25 mcg/kg/min (12/14/23 0759)    PRN Medications: acetaminophen  **OR** acetaminophen , albuterol , camphor-menthol , hydrALAZINE , hydrOXYzine , liver oil-zinc  oxide, methocarbamol, midodrine , naphazoline-pheniramine, nystatin , ondansetron  **OR** ondansetron  (ZOFRAN ) IV, mouth rinse, oxyCODONE , sodium chloride  flush, traZODone     Assessment/Plan   1. 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  Echo this admission with EF 60-65%, mild RV dilation/mild RV dysfunction.  She was started on milrinone for RV support and diuresed extensively.  RHC 10/22 showed that she still has significantly elevated right-sided filling pressures but with PCWP only 12.  Lasix  gtt at 15 mg/hr started.  This morning, co-ox 73% on milrinone 0.25 with creatinine 2.86 => 3.25.  I think we have diuresed her as much as we will be able to diurese, with RV  failure goal CVP will need to be around 10.  - Stop Lasix , allow creatinine to equilibrate.   - Continue milrinone 0.25 mcg/kg/min as we allow creatinine to equilibrate.  - No SGLT2 inhibitor or aldosterone antagonist with AKI.  2. AKI on CKD stage IV: Cardiorenal syndrome in setting of RV failure.  Creatinine increasing, 2.89 => 3.25 today.   - Stop Lasix  as above.   3. Pulmonary hypertension: RHC showed severe PAH.  She had a V/Q scan earlier this year with no sign of chronic PE.  She is on home oxygen  and Bipap at night.  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  - I suspect that she would not get much benefit from pulmonary vasodilators.  If BP remains stable, could consider trial of sildenafil 20 tid to see if she has any symptomatic improvement.  - Continue oxygen  during the day, Bipap at night.  - Sent rheumatalogic serologies (?WHO group 1 PH component).   Mobilize.   Length of Stay: 15  Ezra Shuck, MD  12/14/2023, 8:34 AM  Advanced Heart Failure Team Pager [phone] (M-F; 7a - 5p)  Please contact [facility] for night-coverage after hours (5p -7a ) and weekends on [email or web address]

## 2023-12-14 NOTE — Plan of Care (Signed)
  Problem: Skin Integrity: Goal: Risk for impaired skin integrity will decrease Outcome: Progressing   Problem: Education: Goal: Knowledge of General Education information will improve Description: Including pain rating scale, medication(s)/side effects and non-pharmacologic comfort measures Outcome: Progressing   Problem: Clinical Measurements: Goal: Will remain free from infection Outcome: Progressing   Problem: Clinical Measurements: Goal: Diagnostic test results will improve Outcome: Progressing   Problem: Clinical Measurements: Goal: Respiratory complications will improve Outcome: Progressing   Problem: Clinical Measurements: Goal: Cardiovascular complication will be avoided Outcome: Progressing

## 2023-12-14 NOTE — Plan of Care (Signed)

## 2023-12-14 NOTE — Progress Notes (Signed)
 PT Cancellation Note  Patient Details Name: [name] [name] MRN: [id number] DOB: January 01, 1956   Cancelled Treatment:    Reason Eval/Treat Not Completed: OT screened, no needs identified, will sign off (Pt family want to take home. Hoyer lift and bed bound at baseline. Will sign off at this time. Please re-consult if further needs arise.)  Dorothyann Maier, DPT, CLT  Acute Rehabilitation Services Office: [phone] (Secure chat preferred)   Dorothyann VEAR Maier 12/14/2023, 3:30 PM

## 2023-12-15 DIAGNOSIS — J9601 Acute respiratory failure with hypoxia: Secondary | ICD-10-CM | POA: Diagnosis not present

## 2023-12-15 DIAGNOSIS — I50810 Right heart failure, unspecified: Secondary | ICD-10-CM | POA: Diagnosis not present

## 2023-12-15 DIAGNOSIS — I272 Pulmonary hypertension, unspecified: Secondary | ICD-10-CM | POA: Diagnosis not present

## 2023-12-15 LAB — COOXEMETRY PANEL
Carboxyhemoglobin: 2.1 % — ABNORMAL HIGH (ref 0.5–1.5)
Methemoglobin: <0.7 % (ref 0.0–1.5)
O2 Saturation: 77.8 %
Total hemoglobin: 9.4 g/dL — ABNORMAL LOW (ref 12.0–16.0)

## 2023-12-15 LAB — BASIC METABOLIC PANEL WITH GFR
Anion gap: 14 (ref 5–15)
BUN: 96 mg/dL — ABNORMAL HIGH (ref 8–23)
CO2: 32 mmol/L (ref 22–32)
Calcium: 9.4 mg/dL (ref 8.9–10.3)
Chloride: 86 mmol/L — ABNORMAL LOW (ref 98–111)
Creatinine, Ser: 3.67 mg/dL — ABNORMAL HIGH (ref 0.44–1.00)
GFR, Estimated: 13 mL/min — ABNORMAL LOW (ref >60)
Glucose, Bld: 273 mg/dL — ABNORMAL HIGH (ref 70–99)
Potassium: 4.3 mmol/L (ref 3.5–5.1)
Sodium: 132 mmol/L — ABNORMAL LOW (ref 135–145)

## 2023-12-15 LAB — GLUCOSE, CAPILLARY
Glucose-Capillary: 213 mg/dL — ABNORMAL HIGH (ref 70–99)
Glucose-Capillary: 215 mg/dL — ABNORMAL HIGH (ref 70–99)
Glucose-Capillary: 177 mg/dL — ABNORMAL HIGH (ref 70–99)
Glucose-Capillary: 195 mg/dL — ABNORMAL HIGH (ref 70–99)

## 2023-12-15 LAB — ANTI-SCLERODERMA ANTIBODY: Scleroderma (Scl-70) (ENA) Antibody, IgG: <0.2 AI (ref 0.0–0.9)

## 2023-12-15 LAB — ANA W/REFLEX IF POSITIVE: Anti Nuclear Antibody (ANA): NEGATIVE

## 2023-12-15 LAB — RHEUMATOID FACTOR: Rheumatoid fact SerPl-aCnc: 10.9 [IU]/mL (ref <14.0)

## 2023-12-15 LAB — CENTROMERE ANTIBODIES: Centromere Ab Screen: <0.2 AI (ref 0.0–0.9)

## 2023-12-15 MED ORDER — OXYCODONE HCL 5 MG PO TABS
5.0000 mg | ORAL_TABLET | ORAL | Status: DC | PRN
  Administered 2023-12-15 – 2023-12-17 (×2): 10 mg via ORAL
  Administered 2023-12-18: 5 mg via ORAL
  Administered 2023-12-19 – 2023-12-20 (×3): 10 mg via ORAL
  Administered 2023-12-20: 5 mg via ORAL
  Administered 2023-12-20: 10 mg via ORAL
  Administered 2023-12-20: 5 mg via ORAL
  Administered 2023-12-21 – 2023-12-26 (×6): 10 mg via ORAL
  Filled 2023-12-15: qty 2
  Filled 2023-12-15: qty 1
  Filled 2023-12-15 (×6): qty 2
  Filled 2023-12-15: qty 1
  Filled 2023-12-15: qty 2
  Filled 2023-12-15: qty 1
  Filled 2023-12-15 (×4): qty 2

## 2023-12-15 MED ORDER — FUROSEMIDE 10 MG/ML IJ SOLN
80.0000 mg | Freq: Once | INTRAMUSCULAR | Status: AC
  Administered 2023-12-15: 80 mg via INTRAVENOUS
  Filled 2023-12-15: qty 8

## 2023-12-15 MED ORDER — PREDNISONE 10 MG PO TABS
10.0000 mg | ORAL_TABLET | Freq: Every day | ORAL | Status: AC
  Administered 2023-12-16: 10 mg via ORAL
  Filled 2023-12-15: qty 1

## 2023-12-15 MED ORDER — INSULIN ASPART 100 UNIT/ML IJ SOLN
3.0000 [IU] | Freq: Three times a day (TID) | INTRAMUSCULAR | Status: DC
  Administered 2023-12-15 – 2023-12-17 (×6): 3 [IU] via SUBCUTANEOUS

## 2023-12-15 NOTE — TOC Initial Note (Addendum)
 Transition of Care [facility]) - Initial/Assessment Note    Patient Details  Name: [name] [name] MRN: [id number] Date of Birth: 24-Jul-1955  [facility]) CM/SW Contact:    [name], RN Phone Number: [phone] 12/15/2023, 5:36 PM  Clinical Narrative:                 Spoke to pt at bedside. Pt states she lives at home with help of [facility] Aides that come from 9-12 and 6-8 pm arranged by [place]. [facility] months for assessment/follow up. Offered choice for [facility] RN and pt did not have preference. Agreeable to agency that accepts her insurance plan. States her son visits her daily. She has good family support.   Will need PTAR transport at dc. Has oxygen , hospital bed, BIPAP/CPAP, wheelchair, and hoyer lift at home.   [facility] referral sent in portal. Message sent to attending for [facility] orders. [employer] rep, Lynette accepted referral.   Expected Discharge Plan: Home w Home Health Services Barriers to Discharge: Continued Medical Work up   Patient Goals and CMS Choice Patient states their goals for this hospitalization and ongoing recovery are:: wants to return home CMS Medicare.gov Compare Post Acute Care list provided to:: Patient Choice offered to / list presented to : Patient      Expected Discharge Plan and Services In-house Referral: NA Discharge Planning Services: CM Consult Post Acute Care Choice: Home Health Living arrangements for the past 2 months: Apartment                 DME Arranged: N/A DME Agency: NA       HH Arranged: RN HH Agency: NA        Prior Living Arrangements/Services Living arrangements for the past 2 months: Apartment Lives with:: Self Patient language and need for interpreter reviewed:: Yes Do you feel safe going back to the place where you live?: Yes      Need for Family Participation in Patient Care: Yes (Comment) Care giver support system in place?: Yes (comment) Current home services: Homehealth  aide Criminal Activity/Legal Involvement Pertinent to Current Situation/Hospitalization: No - Comment as needed  Activities of Daily Living   ADL Screening (condition at time of admission) Independently performs ADLs?: No Does the patient have a NEW difficulty with bathing/dressing/toileting/self-feeding that is expected to last >3 days?: No Does the patient have a NEW difficulty with getting in/out of bed, walking, or climbing stairs that is expected to last >3 days?: No Does the patient have a NEW difficulty with communication that is expected to last >3 days?: No Is the patient deaf or have difficulty hearing?: No Does the patient have difficulty seeing, even when wearing glasses/contacts?: No Does the patient have difficulty concentrating, remembering, or making decisions?: No  Permission Sought/Granted Permission sought to share information with : Case Manager, Family Supports, PCP Permission granted to share information with : Yes, Verbal Permission Granted  Share Information with NAME: [name]  Permission granted to share info w AGENCY: Home Health, DME, PCP  Permission granted to share info w Relationship: son  Permission granted to share info w Contact Information: [phone]  Emotional Assessment Appearance:: Appears stated age Attitude/Demeanor/Rapport: Engaged Affect (typically observed): Accepting Orientation: : Oriented to Self, Oriented to Place, Oriented to  Time, Oriented to Situation Alcohol  / Substance Use: Not Applicable Psych Involvement: No (comment)  Admission diagnosis:  Acute on chronic systolic congestive heart failure (HCC) [I50.23] Acute respiratory failure  with hypoxia (HCC) [J96.01] Patient Active Problem List   Diagnosis Date Noted   Intermittent confusion 12/05/2023   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   COPD with acute exacerbation (HCC) 12/04/2023   Cellulitis of left breast 11/30/2023   Acute respiratory failure with hypoxia (HCC) 11/29/2023    Palliative care by specialist 04/02/2023   Goals of care, counseling/discussion 04/02/2023   Acute on chronic right heart failure (HCC) 04/01/2023   Obesity hypoventilation syndrome (HCC) 04/01/2023   Cardiogenic shock (HCC) 04/01/2023   Acute on chronic respiratory failure with hypoxia and hypercapnia (HCC) 03/27/2023   Acute hypoxic respiratory failure (HCC) 02/16/2023   Acute on chronic hypoxic respiratory failure (HCC) 01/14/2023   Acute on chronic heart failure with preserved ejection fraction (HFpEF) (HCC) 01/13/2023   Chronic respiratory failure with hypoxia (HCC) 01/13/2023   Chronic kidney disease, stage 3a (HCC) 10/02/2022   Chronic kidney disease, stage 3b (HCC) 10/02/2022   Hyperglycemia due to type 2 diabetes mellitus (HCC) 09/26/2021   Type 2 diabetes mellitus with hyperglycemia, with long-term current use of insulin  (HCC) 09/25/2021   (HFpEF) heart failure with preserved ejection fraction (HCC) 09/25/2021   Hypomagnesemia 09/08/2021   Severe sepsis (HCC) 09/07/2021   Bacteremia due to Klebsiella pneumoniae 09/07/2021   Urinary tract infection 09/07/2021   Acute respiratory failure with hypoxia and hypercapnia (HCC) 09/04/2021   DKA (diabetic ketoacidosis) (HCC) 02/02/2021   Morbid obesity with BMI of 50.0-59.9, adult (HCC) 02/02/2021   Hyperglycemia 02/01/2021   Pancreatitis 09/07/2020   Intractable nausea and vomiting 09/07/2020   Functional quadriplegia (HCC) 09/07/2020   General weakness 08/26/2020   OSA (obstructive sleep apnea) 08/26/2020   Constipation    AKI (acute kidney injury) 08/25/2020   Acute renal failure superimposed on stage 3b chronic kidney disease (HCC) 07/30/2020   Lower extremity cellulitis 02/04/2020   Acute on chronic diastolic heart failure (HCC) 11/23/2019   Vaginal discharge 11/06/2019   Morbid obesity (HCC) 08/22/2019   Opiate overdose (HCC) 08/20/2019   Cellulitis 10/05/2018   Acute cystitis without hematuria 10/05/2018   Chronic  hypotension 10/05/2018   Pressure injury of skin 10/05/2018   Hypoglycemia secondary to sulfonylurea 11/21/2016   Substance induced mood disorder (HCC) 07/02/2014   Acute metabolic encephalopathy    Hyperkalemia    Hypokalemia    Insulin  dependent type 2 diabetes mellitus (HCC)    Hyperlipidemia associated with type 2 diabetes mellitus (HCC)    Anxiety and depression    Anxiety state    PCP:  Clinic, [facility] Pharmacy:   D[facility] - [facility] [address] [place] [address] Phone: [phone] Fax: [phone]     Social Drivers of Health (SDOH) Social History: SDOH Screenings   Food Insecurity: No Food Insecurity (11/29/2023)  Housing: Low Risk  (11/29/2023)  Transportation Needs: No Transportation Needs (11/29/2023)  Utilities: Not At Risk (11/29/2023)  Alcohol  Screen: Low Risk  (03/29/2023)  Depression (PHQ2-9): Low Risk  (03/29/2023)  Financial Resource Strain: Patient Declined (05/16/2022)   Received from [facility]  Physical Activity: Inactive (03/29/2023)  Social Connections: Unknown (12/01/2023)  Stress: No Stress Concern Present (05/16/2022)   Received from [facility]  Tobacco Use: Medium Risk (12/01/2023)   SDOH Interventions:     Readmission Risk Interventions    12/02/2023   11:20 AM 03/28/2023   11:29 AM 02/23/2023   11:48 AM  Readmission Risk Prevention Plan  Transportation Screening Complete Complete Complete  PCP or Specialist Appt within 3-5 Days   Complete  HRI or Home Care Consult   Complete  Social Work Consult for Recovery Care Planning/Counseling   Complete  Palliative Care Screening   Not Applicable  Medication Review Oceanographer) Complete Complete Complete  PCP or Specialist appointment within 3-5 days of discharge Complete    HRI or Home Care Consult Complete Complete   SW Recovery Care/Counseling Consult Complete Complete   Palliative Care Screening Not Applicable Not Applicable   Skilled Nursing  Facility Not Applicable Not Applicable

## 2023-12-15 NOTE — Plan of Care (Signed)
   Problem: Education: Goal: Ability to describe self-care measures that may prevent or decrease complications (Diabetes Survival Skills Education) will improve Outcome: Progressing Goal: Individualized Educational Video(s) Outcome: Progressing   Problem: Coping: Goal: Ability to adjust to condition or change in health will improve Outcome: Progressing

## 2023-12-15 NOTE — Progress Notes (Signed)
 PT Cancellation Note  Patient Details Name: [name] [name] MRN: [id number] DOB: [birth year]   Cancelled Treatment:    Reason Eval/Treat Not Completed: PT screened, no needs identified, will sign off.  Dr. Fairy made aware.  \12/15/2023  [name] CHRISTELLA., PT Acute Rehabilitation Services [phone]  (office)   [name] [name] 12/15/2023, 4:52 PM

## 2023-12-15 NOTE — Progress Notes (Signed)
 Patient ID: [name] [name], female   DOB: [birth year], 68 y.o.   MRN: [id number]     Advanced Heart Failure Rounding Note  Cardiologist: Jerel Balding, MD  Chief Complaint: CHF Subjective:    Co-ox 78%, milrinone 0.25 ongoing.  Creatinie 2.86 => 3.25 => 3.67.  CVP 12 today, rising.  UOP 625 cc.   RHC Procedural Findings (milrinone 0.25), 10/22: Hemodynamics (mmHg) RA mean 17 RV 70/20 PA 67/31, mean 47 PCWP mean 12 Oxygen  saturations: PA 67% AO 100% Cardiac Output (Fick) 6.33  Cardiac Index (Fick) 2.91  PVR 5.5 WU    Objective:   Weight Range: 122.9 kg Body mass index is 49.56 kg/m (pended).   Vital Signs:   Temp:  [98.3 F (36.8 C)-98.7 F (37.1 C)] 98.6 F (37 C) (10/24 0446) Pulse Rate:  [91-107] 91 (10/24 0446) Resp:  [14-16] 14 (10/24 0446) BP: (96-139)/(52-80) 122/80 (10/24 0446) SpO2:  [91 %-100 %] 96 % (10/24 0446) FiO2 (%):  [40 %] 40 % (10/23 2337) Weight:  [122.9 kg] 122.9 kg (10/24 0446) Last BM Date : 12/14/23  Weight change: Filed Weights   12/13/23 0500 12/14/23 0300 12/15/23 0446  Weight: 123.4 kg 123.4 kg 122.9 kg    Intake/Output:   Intake/Output Summary (Last 24 hours) at 12/15/2023 0703 Last data filed at 12/15/2023 0343 Gross per 24 hour  Intake 1247.74 ml  Output 675 ml  Net 572.74 ml      Physical Exam    General: NAD Neck: Thick, JVP difficult, no thyromegaly or thyroid nodule.  Lungs: Clear to auscultation bilaterally with normal respiratory effort. CV: Nondisplaced PMI.  Heart regular S1/S2, no S3/S4, no murmur.  1+ ankle edema.  Abdomen: Soft, nontender, no hepatosplenomegaly, no distention.  Skin: Intact without lesions or rashes.  Neurologic: Alert and oriented x 3.  Psych: Normal affect. Extremities: No clubbing or cyanosis.  HEENT: Normal.   Telemetry   NSR 90s (personally reviewed)  Labs    CBC Recent Labs    12/13/23 1031 12/13/23 1522 12/13/23 1523  WBC 14.9*  --   --   NEUTROABS 13.5*  --   --    HGB 10.2* 13.3 13.3  HCT 34.8* 39.0 39.0  MCV 75.0*  --   --   PLT 132*  --   --    Basic Metabolic Panel Recent Labs    89/76/74 0638 12/15/23 0500  NA 139 132*  K 4.1 4.3  CL 91* 86*  CO2 33* 32  GLUCOSE 166* 273*  BUN 90* 96*  CREATININE 3.25* 3.67*  CALCIUM  9.5 9.4   Liver Function Tests No results for input(s): AST, ALT, ALKPHOS, BILITOT, PROT, ALBUMIN  in the last 72 hours. No results for input(s): LIPASE, AMYLASE in the last 72 hours. Cardiac Enzymes No results for input(s): CKTOTAL, CKMB, CKMBINDEX, TROPONINI in the last 72 hours.  BNP: BNP (last 3 results) Recent Labs    01/13/23 1811 02/15/23 2105 03/27/23 1622  BNP 334.2* 432.5* 462.5*    ProBNP (last 3 results) Recent Labs    11/29/23 1531 11/30/23 0300 12/11/23 1114  PROBNP 1,820.0* 1,624.0* 5,609.0*     D-Dimer No results for input(s): DDIMER in the last 72 hours. Hemoglobin A1C No results for input(s): HGBA1C in the last 72 hours. Fasting Lipid Panel No results for input(s): CHOL, HDL, LDLCALC, TRIG, CHOLHDL, LDLDIRECT in the last 72 hours. Thyroid Function Tests No results for input(s): TSH, T4TOTAL, T3FREE, THYROIDAB in the last 72 hours.  Invalid input(s): FREET3  Other results:   Imaging    No results found.    Medications:     Scheduled Medications:  acidophilus  2 capsule Oral TID   allopurinol   100 mg Oral QHS   alteplase  2 mg Intracatheter Once   arformoterol  15 mcg Nebulization BID   atorvastatin   10 mg Oral QHS   budesonide (PULMICORT) nebulizer solution  0.5 mg Nebulization BID   calcitRIOL  0.25 mcg Oral Q M,W,F   Chlorhexidine  Gluconate Cloth  6 each Topical Daily   diclofenac Sodium  2 g Topical QID   fluticasone  2 spray Each Nare Daily   heparin   5,000 Units Subcutaneous Q8H   insulin  aspart  0-20 Units Subcutaneous TID WC   insulin  aspart  0-5 Units Subcutaneous QHS   insulin  glargine-yfgn  35 Units  Subcutaneous Daily   ipratropium-albuterol   3 mL Nebulization BID   loratadine   10 mg Oral Daily   nystatin  cream  1 Application Topical BID   oxyCODONE   10 mg Oral BID   pantoprazole   40 mg Oral QAC breakfast   polycarbophil  625 mg Oral Daily   polyethylene glycol  17 g Oral Daily   predniSONE  20 mg Oral Q breakfast   pregabalin   75 mg Oral Daily   sertraline  50 mg Oral q AM   sodium chloride  flush  10-40 mL Intracatheter Q12H   triamcinolone  1 Application Topical Daily    Infusions:  milrinone 0.25 mcg/kg/min (12/15/23 0343)    PRN Medications: acetaminophen  **OR** acetaminophen , albuterol , camphor-menthol , hydrALAZINE , hydrOXYzine , liver oil-zinc  oxide, methocarbamol, midodrine , naphazoline-pheniramine, nystatin , ondansetron  **OR** ondansetron  (ZOFRAN ) IV, mouth rinse, oxyCODONE , sodium chloride  flush, traZODone     Assessment/Plan   1. 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  Echo this admission with EF 60-65%, mild RV dilation/mild RV dysfunction.  She was started on milrinone for RV support and diuresed extensively.  RHC 10/22 showed that she still has significantly elevated right-sided filling pressures but with PCWP only 12.  Lasix  gtt at 15 mg/hr started.  This was stopped with rise in creatinine. This morning, co-ox 78% on milrinone 0.25 with creatinine 2.86 => 3.25 => 3.67. Unforunately, CVP starting to rise again (up to 12 today).  - With rising CVP, will give Lasix  80 mg IV x 1.   - Continue milrinone 0.25 mcg/kg/min as we allow creatinine to equilibrate.  - No SGLT2 inhibitor or aldosterone antagonist with AKI.  2. AKI on CKD stage IV: Cardiorenal syndrome in setting of RV failure.  Creatinine increasing, 2.89 => 3.25 => 3.67 today.  BP stable.  As above, creatinine higher but CVP also starting to rise.    - Lasix  80 mg IV x 1.  3. Pulmonary hypertension: RHC showed severe PAH.  She had a  V/Q scan earlier this year with no sign of chronic PE.  She is on home oxygen  and Bipap at night.  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  - I suspect that she would not get much benefit from pulmonary vasodilators.  If BP remains stable, could consider trial of sildenafil 20 tid to see if she has any symptomatic improvement.  - Continue oxygen  during the day, Bipap at night.  - Sent rheumatalogic serologies (?WHO group 1 PH component).   Mobilize.   Length of Stay: 43  Ezra Shuck, MD  12/15/2023, 7:03 AM  Advanced  Heart Failure Team Pager [phone] (M-F; 7a - 5p)  Please contact [facility] for night-coverage after hours (5p -7a ) and weekends on [email or web address]

## 2023-12-15 NOTE — Inpatient Diabetes Management (Addendum)
 Inpatient Diabetes Program Recommendations  AACE/ADA: New Consensus Statement on Inpatient Glycemic Control   Target Ranges:  Prepandial:   less than 140 mg/dL      Peak postprandial:   less than 180 mg/dL (1-2 hours)      Critically ill patients:  140 - 180 mg/dL   Lab Results  Component Value Date   GLUCAP 213 (H) 12/15/2023   HGBA1C 8.8 (H) 11/29/2023    Latest Reference Range & Units 12/14/23 06:50 12/14/23 11:19 12/14/23 16:16 12/14/23 22:17 12/15/23 06:02  Glucose-Capillary 70 - 99 mg/dL 827 (H) 778 (H) 723 (H) 293 (H) 213 (H)   Review of Glycemic Control  Diabetes history: DM2  Outpatient Diabetes medications: FreeStyle Libre 3  Lantus  50 units daily  Semaglutide 2mg  weekly   Current orders for Inpatient glycemic control:  Semglee  35 units daily  Novolog  0-20 units TID + 0-5 units at bedtime  Novolog  3 units TID with meals   Inpatient Diabetes Program Recommendations:   Noted: patient receiving PO steroids. Prednisone 20mg  today.   While patient is receiving steroids, she could benefit from meal coverage insulin . Novolog  3 units TID with meals ordered to start today.    Thanks,  Lavanda Search, RN, MSN, [facility]  Inpatient Diabetes Coordinator  Pager [phone] (8a-5p)

## 2023-12-15 NOTE — Progress Notes (Signed)
 PROGRESS NOTE    [name] [name]  FMW:[id number] DOB: [birth year] DOA: 11/29/2023 PCP: [facility]   68/F morbidly obese with OSA OHS, diastolic CHF, pulmonary hypertension, RV failure, CKD 4, bedbound >admitted to [facility] with CHF exacerbation, complicated by low output started on milrinone and Lasix  gtt. - Volume status improving, now with worsening AKI  Subjective: -Feels okay, no events overnight  Assessment and Plan:  Acute on chronic diastolic CHF, RV failure Severe pulmonary hypertension -Echo this admission with EF 60-65, mildly reduced RV, severely elevated PASP -Heart failure team following, diuresed with Lasix  gtt. and milrinone for RV support -RHC 10/22 noted significantly elevated RV filling pressures, wedge only 12 - CHF team following, plan for repeat IV Lasix  today -Continuing milrinone with worsening AKI -GDMT limited by AKI/CKD 4 - DC Foley when kidney function improves  Acute hypoxic and hypercarbic respiratory failure OSA OHS, morbid obesity with BMI of 49 -On 4 to 5 L O2 at baseline Initial ABG showed pCO2 of 73.  Improving with BiPAP nightly. -Clinically do not suspect COPD exacerbation , stopped IV Solu-Medrol , add prednisone for 2 days and then discontinue  - Continue Pulmicort Brovana DuoNeb, Claritin   - Emphasized importance of BiPAP nightly    Hypokalemia.   Replaced.   Diarrhea.  Intermittent. Uncertain etiology.  Add fiber, discontinue rectal tube GI pathogen panel negative.   AKI on CKD stage 4 Baseline creatinine around 2.1. 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 -Continue Lantus , add meal coverage   Diabetic neuropathy. On Lyrica .  Dose adjusted for renal dysfunction and mentation.   Left breast cellulitis versus breast swelling secondary to dependent edema Treated with linezolid ,  completed 7-day treatment course.  Improved General Surgery was consulted, recommended nursing place several folded towels under left breast to keep it elevated.   Toxic metabolic encephalopathy secondary to hypoxia, hypercarbia as well as Lyrica . Mentation improving after adjusting Lyrica  dose and BiPAP therapy. Improved, communicative and oriented.   Chronic hypotension Currently on midodrine , milrinone and Lasix .    Hyperlipidemia Continue Lipitor   Obesity Class 3 Body mass index is 49.52 kg/m (pended).  Placing the pt at higher risk of poor outcomes.   Bedbound  due to previous history of lower extremity fracture.   Anxiety/depression Continue Zoloft.     Anisocoria. Left pupil more than right. CT scan of the head negative for any acute abnormality.  No other focal deficit.  Monitor.    DVT prophylaxis: Heparin  subcutaneous Code Status: Full code Family Communication: None present Disposition Plan: Back home likely 48 hours  Consultants:    Procedures:   Antimicrobials:    Objective: Vitals:   12/14/23 2100 12/14/23 2337 12/15/23 0446 12/15/23 0745  BP:   122/80 107/60  Pulse:  (!) 104 91   Resp:   14 19  Temp:  98.4 F (36.9 C) 98.6 F (37 C) 98.2 F (36.8 C)  TempSrc:  Oral Oral Oral  SpO2: 100% 93% 96%   Weight:   122.9 kg   Height:        Intake/Output Summary (Last 24 hours) at 12/15/2023 1017 Last data filed at 12/15/2023 0343 Gross per 24 hour  Intake 1021.26 ml  Output 675 ml  Net 346.26 ml   Filed Weights   12/13/23 0500 12/14/23 0300 12/15/23 0446  Weight: 123.4 kg 123.4 kg 122.9 kg    Examination:  General exam: Appears calm and comfortable  Respiratory system: Clear to auscultation Cardiovascular system: S1 & S2 heard, RRR.  Abd: nondistended, soft and nontender.Normal bowel sounds heard. Central nervous system: Alert and oriented. No focal neurological deficits. Extremities: no edema Skin: No rashes Psychiatry:  Mood &  affect appropriate.     Data Reviewed:   CBC: Recent Labs  Lab 12/09/23 1234 12/10/23 0445 12/13/23 1031 12/13/23 1522 12/13/23 1523  WBC 8.0 7.0 14.9*  --   --   NEUTROABS 7.3  --  13.5*  --   --   HGB 10.5* 10.1* 10.2* 13.3 13.3  HCT 38.0 35.9* 34.8* 39.0 39.0  MCV 76.0* 75.6* 75.0*  --   --   PLT 229 199 132*  --   --    Basic Metabolic Panel: Recent Labs  Lab 12/09/23 1234 12/10/23 0445 12/11/23 0847 12/12/23 0426 12/13/23 0507 12/13/23 1522 12/13/23 1523 12/14/23 0638 12/15/23 0500  NA 132* 133* 134* 136 138 135 135 139 132*  K 3.8 3.8 3.8 3.5 3.9 4.2 4.2 4.1 4.3  CL 89* 89* 88* 90* 90*  --   --  91* 86*  CO2 32 33* 34* 33* 35*  --   --  33* 32  GLUCOSE 276* 343* 227* 180* 168*  --   --  166* 273*  BUN 64* 69* 73* 77* 85*  --   --  90* 96*  CREATININE 2.79* 3.20* 2.74* 2.63* 2.86*  --   --  3.25* 3.67*  CALCIUM  9.8 9.7 9.8 9.9 10.2  --   --  9.5 9.4  MG 1.9 2.1  --   --   --   --   --   --   --   PHOS  --  3.6  --   --   --   --   --   --   --    GFR: Estimated Creatinine Clearance: 18 mL/min (A) (by C-G formula based on SCr of 3.67 mg/dL (H)). Liver Function Tests: Recent Labs  Lab 12/09/23 1234 12/10/23 0445  AST <10*  --   ALT <5  --   ALKPHOS 139*  --   BILITOT 0.5  --   PROT 6.6  --   ALBUMIN  3.6 3.6   No results for input(s): LIPASE, AMYLASE in the last 168 hours. No results for input(s): AMMONIA in the last 168 hours. Coagulation Profile: Recent Labs  Lab 12/13/23 1031  INR 1.1   Cardiac Enzymes: No results for input(s): CKTOTAL, CKMB, CKMBINDEX, TROPONINI in the last 168 hours. BNP (last 3 results) Recent Labs    11/29/23 1531 11/30/23 0300 12/11/23 1114  PROBNP 1,820.0* 1,624.0* 5,609.0*   HbA1C: No results for input(s): HGBA1C in the last 72 hours. CBG: Recent Labs  Lab 12/14/23 0650 12/14/23 1119 12/14/23 1616 12/14/23 2217 12/15/23 0602  GLUCAP 172* 221* 276* 293* 213*   Lipid Profile: No results  for input(s): CHOL, HDL, LDLCALC, TRIG, CHOLHDL, LDLDIRECT in the last 72 hours. Thyroid Function Tests: No results for input(s): TSH, T4TOTAL, FREET4, T3FREE, THYROIDAB in the last 72 hours. Anemia Panel: Recent Labs    12/13/23 1031  RETICCTPCT <0.4*   Urine analysis:    Component Value Date/Time   COLORURINE YELLOW 03/27/2023 1717   APPEARANCEUR HAZY (A) 03/27/2023 1717   LABSPEC 1.014 03/27/2023 1717   PHURINE 5.0 03/27/2023 1717   GLUCOSEU NEGATIVE 03/27/2023 1717   HGBUR NEGATIVE 03/27/2023 1717   BILIRUBINUR NEGATIVE 03/27/2023 1717   KETONESUR  NEGATIVE 03/27/2023 1717   PROTEINUR NEGATIVE 03/27/2023 1717   UROBILINOGEN 0.2 06/28/2014 1500   NITRITE NEGATIVE 03/27/2023 1717   LEUKOCYTESUR SMALL (A) 03/27/2023 1717   Sepsis Labs: @LABRCNTIP (procalcitonin:4,lacticidven:4)  ) Recent Results (from the past 240 hours)  Gastrointestinal Panel by PCR , Stool     Status: None   Collection Time: 12/07/23  1:12 PM   Specimen: Stool  Result Value Ref Range Status   Campylobacter species NOT DETECTED NOT DETECTED Final   Plesimonas shigelloides NOT DETECTED NOT DETECTED Final   Salmonella species NOT DETECTED NOT DETECTED Final   Yersinia enterocolitica NOT DETECTED NOT DETECTED Final   Vibrio species NOT DETECTED NOT DETECTED Final   Vibrio cholerae NOT DETECTED NOT DETECTED Final   Enteroaggregative E coli (EAEC) NOT DETECTED NOT DETECTED Final   Enteropathogenic E coli (EPEC) NOT DETECTED NOT DETECTED Final   Enterotoxigenic E coli (ETEC) NOT DETECTED NOT DETECTED Final   Shiga like toxin producing E coli (STEC) NOT DETECTED NOT DETECTED Final   Shigella/Enteroinvasive E coli (EIEC) NOT DETECTED NOT DETECTED Final   Cryptosporidium NOT DETECTED NOT DETECTED Final   Cyclospora cayetanensis NOT DETECTED NOT DETECTED Final   Entamoeba histolytica NOT DETECTED NOT DETECTED Final   Giardia lamblia NOT DETECTED NOT DETECTED Final   Adenovirus F40/41 NOT  DETECTED NOT DETECTED Final   Astrovirus NOT DETECTED NOT DETECTED Final   Norovirus GI/GII NOT DETECTED NOT DETECTED Final   Rotavirus A NOT DETECTED NOT DETECTED Final   Sapovirus (I, II, IV, and V) NOT DETECTED NOT DETECTED Final    Comment: Performed at [facility], [address], [place], [place] [address]     Radiology Studies: CARDIAC CATHETERIZATION Result Date: 12/13/2023 1. Elevated RA pressure, primarily RV failure. 2. Normal PCWP. 3. Moderate-severe pulmonary arterial hypertension 4. Preserved cardiac output on milrinone. Continue diuresis, continue milrinone for RV support.     Scheduled Meds:  acidophilus  2 capsule Oral TID   allopurinol   100 mg Oral QHS   alteplase  2 mg Intracatheter Once   arformoterol  15 mcg Nebulization BID   atorvastatin   10 mg Oral QHS   budesonide (PULMICORT) nebulizer solution  0.5 mg Nebulization BID   calcitRIOL  0.25 mcg Oral Q M,W,F   Chlorhexidine  Gluconate Cloth  6 each Topical Daily   diclofenac Sodium  2 g Topical QID   fluticasone  2 spray Each Nare Daily   heparin   5,000 Units Subcutaneous Q8H   insulin  aspart  0-20 Units Subcutaneous TID WC   insulin  aspart  0-5 Units Subcutaneous QHS   insulin  aspart  3 Units Subcutaneous TID WC   insulin  glargine-yfgn  35 Units Subcutaneous Daily   ipratropium-albuterol   3 mL Nebulization BID   loratadine   10 mg Oral Daily   nystatin  cream  1 Application Topical BID   oxyCODONE   10 mg Oral BID   pantoprazole   40 mg Oral QAC breakfast   polycarbophil  625 mg Oral Daily   polyethylene glycol  17 g Oral Daily   [START ON 12/16/2023] predniSONE  10 mg Oral Q breakfast   pregabalin   75 mg Oral Daily   sertraline  50 mg Oral q AM   sodium chloride  flush  10-40 mL Intracatheter Q12H   triamcinolone  1 Application Topical Daily   Continuous Infusions:  milrinone 0.25 mcg/kg/min (12/15/23 0909)     LOS: 16 days    Time spent:    Sigurd Pac, MD [facility]   12/15/2023, 10:17 AM

## 2023-12-15 NOTE — Progress Notes (Signed)
   12/15/23 2312  BiPAP/CPAP/SIPAP  BiPAP/CPAP/SIPAP Pt Type Adult  BiPAP/CPAP/SIPAP Resmed  Mask Type Full face mask  Mask Size Medium  IPAP 12 cmH20  EPAP 6 cmH2O  Flow Rate 4 lpm  Patient Home Machine No  Patient Home Mask No  Patient Home Tubing No  Auto Titrate No  Oxygen  Percent 36 %

## 2023-12-15 NOTE — Progress Notes (Signed)
 I attempted follow up with [name] [name] for emotional and spiritual support.  She had a friend visiting and wanted to spend some time with him.  I will attempt to follow up later in the day if time permits.

## 2023-12-16 DIAGNOSIS — I132 Hypertensive heart and chronic kidney disease with heart failure and with stage 5 chronic kidney disease, or end stage renal disease: Secondary | ICD-10-CM | POA: Diagnosis not present

## 2023-12-16 DIAGNOSIS — A419 Sepsis, unspecified organism: Secondary | ICD-10-CM | POA: Diagnosis not present

## 2023-12-16 DIAGNOSIS — Z66 Do not resuscitate: Secondary | ICD-10-CM | POA: Diagnosis not present

## 2023-12-16 DIAGNOSIS — Z515 Encounter for palliative care: Secondary | ICD-10-CM | POA: Diagnosis not present

## 2023-12-16 DIAGNOSIS — J9601 Acute respiratory failure with hypoxia: Secondary | ICD-10-CM | POA: Diagnosis not present

## 2023-12-16 LAB — BASIC METABOLIC PANEL WITH GFR
Anion gap: 15 (ref 5–15)
BUN: 96 mg/dL — ABNORMAL HIGH (ref 8–23)
CO2: 31 mmol/L (ref 22–32)
Calcium: 9.3 mg/dL (ref 8.9–10.3)
Chloride: 85 mmol/L — ABNORMAL LOW (ref 98–111)
Creatinine, Ser: 3.61 mg/dL — ABNORMAL HIGH (ref 0.44–1.00)
GFR, Estimated: 13 mL/min — ABNORMAL LOW (ref >60)
Glucose, Bld: 191 mg/dL — ABNORMAL HIGH (ref 70–99)
Potassium: 3.9 mmol/L (ref 3.5–5.1)
Sodium: 131 mmol/L — ABNORMAL LOW (ref 135–145)

## 2023-12-16 LAB — CBC
HCT: 31.5 % — ABNORMAL LOW (ref 36.0–46.0)
Hemoglobin: 9.3 g/dL — ABNORMAL LOW (ref 12.0–15.0)
MCH: 21.7 pg — ABNORMAL LOW (ref 26.0–34.0)
MCHC: 29.5 g/dL — ABNORMAL LOW (ref 30.0–36.0)
MCV: 73.6 fL — ABNORMAL LOW (ref 80.0–100.0)
Platelets: 89 K/uL — ABNORMAL LOW (ref 150–400)
RBC: 4.28 MIL/uL (ref 3.87–5.11)
RDW: 20.2 % — ABNORMAL HIGH (ref 11.5–15.5)
WBC: 17.9 K/uL — ABNORMAL HIGH (ref 4.0–10.5)
nRBC: 0 % (ref 0.0–0.2)

## 2023-12-16 LAB — GLUCOSE, CAPILLARY
Glucose-Capillary: 184 mg/dL — ABNORMAL HIGH (ref 70–99)
Glucose-Capillary: 207 mg/dL — ABNORMAL HIGH (ref 70–99)
Glucose-Capillary: 221 mg/dL — ABNORMAL HIGH (ref 70–99)
Glucose-Capillary: 168 mg/dL — ABNORMAL HIGH (ref 70–99)

## 2023-12-16 LAB — URINALYSIS, ROUTINE W REFLEX MICROSCOPIC
Bilirubin Urine: NEGATIVE
Glucose, UA: NEGATIVE mg/dL
Ketones, ur: NEGATIVE mg/dL
Nitrite: NEGATIVE
Protein, ur: 100 mg/dL — AB
RBC / HPF: >50 RBC/hpf (ref 0–5)
Specific Gravity, Urine: 1.011 (ref 1.005–1.030)
WBC, UA: >50 WBC/hpf (ref 0–5)
pH: 7 (ref 5.0–8.0)

## 2023-12-16 LAB — COOXEMETRY PANEL
Carboxyhemoglobin: 1.5 % (ref 0.5–1.5)
Methemoglobin: 1.5 % (ref 0.0–1.5)
O2 Saturation: 68.7 %
Total hemoglobin: 10 g/dL — ABNORMAL LOW (ref 12.0–16.0)

## 2023-12-16 MED ORDER — FUROSEMIDE 10 MG/ML IJ SOLN
80.0000 mg | Freq: Once | INTRAMUSCULAR | Status: AC
  Administered 2023-12-16: 80 mg via INTRAVENOUS
  Filled 2023-12-16: qty 8

## 2023-12-16 NOTE — Progress Notes (Signed)
 PROGRESS NOTE    [name] [name]  FMW:[id number] DOB: August 31, 1955 DOA: 11/29/2023 PCP: [facility]   68/F morbidly obese with OSA OHS, diastolic CHF, pulmonary hypertension, RV failure, CKD 4, bedbound x3 years >admitted to [facility] with CHF exacerbation, complicated by low output started on milrinone and Lasix  gtt. - Volume status improving, now with worsening AKI  Subjective: -Feels okay, no events overnight, used BiPAP last night, wondering when she can go home  Assessment and Plan:  Acute on chronic diastolic CHF, RV failure Severe pulmonary hypertension -Echo this admission with EF 60-65, mildly reduced RV, severely elevated PASP -Heart failure team following, diuresed with Lasix  gtt. and milrinone for RV support -RHC 10/22 noted significantly elevated RV filling pressures, wedge only 12 - CHF team following, plan for repeat IV Lasix  today -Continuing milrinone with AKI/CKD, GDMT limited - DC Foley today  Acute hypoxic and hypercarbic respiratory failure OSA OHS, morbid obesity with BMI of 49 -On 4 to 5 L O2 at baseline Initial ABG showed pCO2 of 73.  Improving with BiPAP nightly. -Clinically do not suspect COPD exacerbation , stopped IV Solu-Medrol , add prednisone for 2 days and then discontinue  - Continue Pulmicort Brovana DuoNeb, Claritin   - Emphasized importance of BiPAP nightly   Leukocytosis -Afebrile and nontoxic, prednisone could be contributing as well -DC Foley, check urine culture -Also has sacral decubitus and thigh wounds, wound care consulted   Hypokalemia.   Replaced.   Diarrhea.  Intermittent. Uncertain etiology.  Add fiber, discontinue rectal tube GI pathogen panel negative.   AKI on CKD stage 4 Baseline creatinine around 2.1. 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 -Continue Lantus , add meal  coverage   Diabetic neuropathy. On Lyrica .  Dose adjusted for renal dysfunction and mentation.   Left breast cellulitis versus breast swelling secondary to dependent edema Treated with linezolid , completed 7-day treatment course.  Improved General Surgery was consulted, recommended nursing place several folded towels under left breast to keep it elevated.   Toxic metabolic encephalopathy secondary to hypoxia, hypercarbia as well as Lyrica . Mentation improving after adjusting Lyrica  dose and BiPAP therapy. Improved, communicative and oriented.   Chronic hypotension Currently on midodrine , milrinone and Lasix .    Hyperlipidemia Continue Lipitor   Obesity Class 3 Body mass index is 49.52 kg/m (pended).  Placing the pt at higher risk of poor outcomes.   Bedbound  due to previous history of lower extremity fracture.   Anxiety/depression Continue Zoloft.     Anisocoria. Left pupil more than right. CT scan of the head negative for any acute abnormality.  No other focal deficit.  Monitor.    DVT prophylaxis: Heparin  subcutaneous Code Status: Full code Family Communication: None present Disposition Plan: Back home likely 48 hours  Consultants:    Procedures:   Antimicrobials:    Objective: Vitals:   12/16/23 0750 12/16/23 0801 12/16/23 0804 12/16/23 1115  BP: 126/78   (!) 142/75  Pulse: (!) 102   (!) 110  Resp: 18   17  Temp: 98 F (36.7 C)   98 F (36.7 C)  TempSrc: Oral   Oral  SpO2: 93% 98% 98% 97%  Weight:      Height:        Intake/Output Summary (Last 24 hours) at 12/16/2023 1315 Last data filed at 12/16/2023 1200 Gross per 24 hour  Intake 527.11 ml  Output 900 ml  Net -372.89 ml   Filed Weights   12/14/23 0300 12/15/23 0446 12/16/23 0432  Weight: 123.4 kg 122.9 kg 122 kg    Examination:  General exam: Morbidly obese chronically ill, AAO x 3 HEENT: Neck obese unable to assess JVD Respiratory system: Distant breath sounds otherwise  clear Cardiovascular system: S1 & S2 heard, RRR.  Abd: nondistended, soft and nontender.Normal bowel sounds heard. Extremities: Trace edema Skin: No rashes Psychiatry:  Mood & affect appropriate.     Data Reviewed:   CBC: Recent Labs  Lab 12/10/23 0445 12/13/23 1031 12/13/23 1522 12/13/23 1523 12/16/23 0450  WBC 7.0 14.9*  --   --  17.9*  NEUTROABS  --  13.5*  --   --   --   HGB 10.1* 10.2* 13.3 13.3 9.3*  HCT 35.9* 34.8* 39.0 39.0 31.5*  MCV 75.6* 75.0*  --   --  73.6*  PLT 199 132*  --   --  89*   Basic Metabolic Panel: Recent Labs  Lab 12/10/23 0445 12/11/23 0847 12/12/23 0426 12/13/23 0507 12/13/23 1522 12/13/23 1523 12/14/23 0638 12/15/23 0500 12/16/23 0450  NA 133*   < > 136 138 135 135 139 132* 131*  K 3.8   < > 3.5 3.9 4.2 4.2 4.1 4.3 3.9  CL 89*   < > 90* 90*  --   --  91* 86* 85*  CO2 33*   < > 33* 35*  --   --  33* 32 31  GLUCOSE 343*   < > 180* 168*  --   --  166* 273* 191*  BUN 69*   < > 77* 85*  --   --  90* 96* 96*  CREATININE 3.20*   < > 2.63* 2.86*  --   --  3.25* 3.67* 3.61*  CALCIUM  9.7   < > 9.9 10.2  --   --  9.5 9.4 9.3  MG 2.1  --   --   --   --   --   --   --   --   PHOS 3.6  --   --   --   --   --   --   --   --    < > = values in this interval not displayed.   GFR: Estimated Creatinine Clearance: 18.2 mL/min (A) (by C-G formula based on SCr of 3.61 mg/dL (H)). Liver Function Tests: Recent Labs  Lab 12/10/23 0445  ALBUMIN  3.6   No results for input(s): LIPASE, AMYLASE in the last 168 hours. No results for input(s): AMMONIA in the last 168 hours. Coagulation Profile: Recent Labs  Lab 12/13/23 1031  INR 1.1   Cardiac Enzymes: No results for input(s): CKTOTAL, CKMB, CKMBINDEX, TROPONINI in the last 168 hours. BNP (last 3 results) Recent Labs    11/29/23 1531 11/30/23 0300 12/11/23 1114  PROBNP 1,820.0* 1,624.0* 5,609.0*   HbA1C: No results for input(s): HGBA1C in the last 72 hours. CBG: Recent Labs   Lab 12/15/23 1056 12/15/23 1634 12/15/23 2048 12/16/23 0619 12/16/23 1113  GLUCAP 215* 177* 195* 184* 207*   Lipid Profile: No results for input(s): CHOL, HDL, LDLCALC, TRIG, CHOLHDL, LDLDIRECT in the last 72 hours. Thyroid Function Tests: No results for input(s): TSH, T4TOTAL, FREET4, T3FREE, THYROIDAB in the last 72 hours. Anemia Panel: No results for input(s): VITAMINB12, FOLATE, FERRITIN, TIBC, IRON, RETICCTPCT in the last 72 hours.  Urine analysis:    Component Value Date/Time   COLORURINE YELLOW 03/27/2023 1717  APPEARANCEUR HAZY (A) 03/27/2023 1717   LABSPEC 1.014 03/27/2023 1717   PHURINE 5.0 03/27/2023 1717   GLUCOSEU NEGATIVE 03/27/2023 1717   HGBUR NEGATIVE 03/27/2023 1717   BILIRUBINUR NEGATIVE 03/27/2023 1717   KETONESUR NEGATIVE 03/27/2023 1717   PROTEINUR NEGATIVE 03/27/2023 1717   UROBILINOGEN 0.2 06/28/2014 1500   NITRITE NEGATIVE 03/27/2023 1717   LEUKOCYTESUR SMALL (A) 03/27/2023 1717   Sepsis Labs: @LABRCNTIP (procalcitonin:4,lacticidven:4)  ) Recent Results (from the past 240 hours)  Gastrointestinal Panel by PCR , Stool     Status: None   Collection Time: 12/07/23  1:12 PM   Specimen: Stool  Result Value Ref Range Status   Campylobacter species NOT DETECTED NOT DETECTED Final   Plesimonas shigelloides NOT DETECTED NOT DETECTED Final   Salmonella species NOT DETECTED NOT DETECTED Final   Yersinia enterocolitica NOT DETECTED NOT DETECTED Final   Vibrio species NOT DETECTED NOT DETECTED Final   Vibrio cholerae NOT DETECTED NOT DETECTED Final   Enteroaggregative E coli (EAEC) NOT DETECTED NOT DETECTED Final   Enteropathogenic E coli (EPEC) NOT DETECTED NOT DETECTED Final   Enterotoxigenic E coli (ETEC) NOT DETECTED NOT DETECTED Final   Shiga like toxin producing E coli (STEC) NOT DETECTED NOT DETECTED Final   Shigella/Enteroinvasive E coli (EIEC) NOT DETECTED NOT DETECTED Final   Cryptosporidium NOT DETECTED NOT  DETECTED Final   Cyclospora cayetanensis NOT DETECTED NOT DETECTED Final   Entamoeba histolytica NOT DETECTED NOT DETECTED Final   Giardia lamblia NOT DETECTED NOT DETECTED Final   Adenovirus F40/41 NOT DETECTED NOT DETECTED Final   Astrovirus NOT DETECTED NOT DETECTED Final   Norovirus GI/GII NOT DETECTED NOT DETECTED Final   Rotavirus A NOT DETECTED NOT DETECTED Final   Sapovirus (I, II, IV, and V) NOT DETECTED NOT DETECTED Final    Comment: Performed at [facility], [address], [place], [place] [address]     Radiology Studies: No results found.    Scheduled Meds:  acidophilus  2 capsule Oral TID   allopurinol   100 mg Oral QHS   alteplase  2 mg Intracatheter Once   arformoterol  15 mcg Nebulization BID   atorvastatin   10 mg Oral QHS   budesonide (PULMICORT) nebulizer solution  0.5 mg Nebulization BID   calcitRIOL  0.25 mcg Oral Q M,W,F   Chlorhexidine  Gluconate Cloth  6 each Topical Daily   diclofenac Sodium  2 g Topical QID   fluticasone  2 spray Each Nare Daily   insulin  aspart  0-20 Units Subcutaneous TID WC   insulin  aspart  0-5 Units Subcutaneous QHS   insulin  aspart  3 Units Subcutaneous TID WC   insulin  glargine-yfgn  35 Units Subcutaneous Daily   ipratropium-albuterol   3 mL Nebulization BID   loratadine   10 mg Oral Daily   nystatin  cream  1 Application Topical BID   oxyCODONE   10 mg Oral BID   pantoprazole   40 mg Oral QAC breakfast   polycarbophil  625 mg Oral Daily   polyethylene glycol  17 g Oral Daily   pregabalin   75 mg Oral Daily   sertraline  50 mg Oral q AM   sodium chloride  flush  10-40 mL Intracatheter Q12H   triamcinolone  1 Application Topical Daily   Continuous Infusions:  milrinone 0.25 mcg/kg/min (12/16/23 0620)     LOS: 17 days    Time spent:    Sigurd Pac, MD [facility]   12/16/2023, 1:15 PM

## 2023-12-16 NOTE — Progress Notes (Signed)
 Patient ID: [name] [name], female   DOB: 10/17/1955, 68 y.o.   MRN: [id number]      Cardiologist: Jerel Balding, MD  Chief Complaint: CHF Subjective:    Co-ox 68%, milrinone 0.25 ongoing.  Creatinie 2.86 => 3.25 => 3.67> 3.61.  CVP 11.  UOP 725 cc.   RHC Procedural Findings (milrinone 0.25), 10/22: Hemodynamics (mmHg) RA mean 17 RV 70/20 PA 67/31, mean 47 PCWP mean 12 Oxygen  saturations: PA 67% AO 100% Cardiac Output (Fick) 6.33  Cardiac Index (Fick) 2.91  PVR 5.5 WU    Objective:   Weight Range: 122 kg Body mass index is 49.19 kg/m (pended).   Vital Signs:   Temp:  [98 F (36.7 C)-98.2 F (36.8 C)] 98 F (36.7 C) (10/25 0750) Pulse Rate:  [100-108] 102 (10/25 0750) Resp:  [15-20] 18 (10/25 0750) BP: (118-150)/(57-78) 126/78 (10/25 0750) SpO2:  [92 %-98 %] 98 % (10/25 0804) Weight:  [122 kg] 122 kg (10/25 0432) Last BM Date : 12/14/23  Weight change: Filed Weights   12/14/23 0300 12/15/23 0446 12/16/23 0432  Weight: 123.4 kg 122.9 kg 122 kg    Intake/Output:   Intake/Output Summary (Last 24 hours) at 12/16/2023 1144 Last data filed at 12/16/2023 0804 Gross per 24 hour  Intake 612.65 ml  Output 750 ml  Net -137.35 ml      Physical Exam    General: NAD Neck: Thick, JVP difficult Lungs: Clear to auscultation bilaterally with normal respiratory effort. CV: Heart regular S1/S2, no S3/S4, no murmur.  1+ ankle edema.  Abdomen: Soft, nontender no distention.  Skin: Intact without lesions or rashes.  Neurologic: Alert and oriented x 3.  Psych: Normal affect. Extremities: No clubbing or cyanosis.  HEENT: Normal.   Telemetry   NSR 90s (personally reviewed), rare PVCs  Labs    CBC Recent Labs    12/13/23 1523 12/16/23 0450  WBC  --  17.9*  HGB 13.3 9.3*  HCT 39.0 31.5*  MCV  --  73.6*  PLT  --  89*   Basic Metabolic Panel Recent Labs    89/75/74 0500 12/16/23 0450  NA 132* 131*  K 4.3 3.9  CL 86* 85*  CO2 32 31  GLUCOSE 273*  191*  BUN 96* 96*  CREATININE 3.67* 3.61*  CALCIUM  9.4 9.3   Liver Function Tests No results for input(s): AST, ALT, ALKPHOS, BILITOT, PROT, ALBUMIN  in the last 72 hours. No results for input(s): LIPASE, AMYLASE in the last 72 hours. Cardiac Enzymes No results for input(s): CKTOTAL, CKMB, CKMBINDEX, TROPONINI in the last 72 hours.  BNP: BNP (last 3 results) Recent Labs    01/13/23 1811 02/15/23 2105 03/27/23 1622  BNP 334.2* 432.5* 462.5*    ProBNP (last 3 results) Recent Labs    11/29/23 1531 11/30/23 0300 12/11/23 1114  PROBNP 1,820.0* 1,624.0* 5,609.0*     D-Dimer No results for input(s): DDIMER in the last 72 hours. Hemoglobin A1C No results for input(s): HGBA1C in the last 72 hours. Fasting Lipid Panel No results for input(s): CHOL, HDL, LDLCALC, TRIG, CHOLHDL, LDLDIRECT in the last 72 hours. Thyroid Function Tests No results for input(s): TSH, T4TOTAL, T3FREE, THYROIDAB in the last 72 hours.  Invalid input(s): FREET3  Other results:   Imaging    No results found.    Medications:     Scheduled Medications:  acidophilus  2 capsule Oral TID   allopurinol   100 mg Oral QHS   alteplase  2 mg Intracatheter Once  arformoterol  15 mcg Nebulization BID   atorvastatin   10 mg Oral QHS   budesonide (PULMICORT) nebulizer solution  0.5 mg Nebulization BID   calcitRIOL  0.25 mcg Oral Q M,W,F   Chlorhexidine  Gluconate Cloth  6 each Topical Daily   diclofenac Sodium  2 g Topical QID   fluticasone  2 spray Each Nare Daily   insulin  aspart  0-20 Units Subcutaneous TID WC   insulin  aspart  0-5 Units Subcutaneous QHS   insulin  aspart  3 Units Subcutaneous TID WC   insulin  glargine-yfgn  35 Units Subcutaneous Daily   ipratropium-albuterol   3 mL Nebulization BID   loratadine   10 mg Oral Daily   nystatin  cream  1 Application Topical BID   oxyCODONE   10 mg Oral BID   pantoprazole   40 mg Oral QAC breakfast    polycarbophil  625 mg Oral Daily   polyethylene glycol  17 g Oral Daily   pregabalin   75 mg Oral Daily   sertraline  50 mg Oral q AM   sodium chloride  flush  10-40 mL Intracatheter Q12H   triamcinolone  1 Application Topical Daily    Infusions:  milrinone 0.25 mcg/kg/min (12/16/23 0620)    PRN Medications: acetaminophen  **OR** acetaminophen , albuterol , camphor-menthol , liver oil-zinc  oxide, methocarbamol, midodrine , naphazoline-pheniramine, nystatin , ondansetron  **OR** ondansetron  (ZOFRAN ) IV, mouth rinse, oxyCODONE , sodium chloride  flush, traZODone     Assessment/Plan   1. 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  Echo this admission with EF 60-65%, mild RV dilation/mild RV dysfunction.  She was started on milrinone for RV support and diuresed extensively.  RHC 10/22 showed that she still has significantly elevated right-sided filling pressures but with PCWP only 12.  Lasix  gtt at 15 mg/hr started.  This was stopped with rise in creatinine. This morning, co-ox 68% on milrinone 0.25 with creatinine 2.86 => 3.25 => 3.67>3.61. CVP 11 - Lasix  80 mg IV x 1 given mild improvement in CR and mildly reduced coox with CVP still 11 and modest UOP. - Continue milrinone 0.25 mcg/kg/min as we allow creatinine to equilibrate.  - No SGLT2 inhibitor or aldosterone antagonist with AKI.  2. AKI on CKD stage IV: Cardiorenal syndrome in setting of RV failure.  Creatinine increasing, 2.89 => 3.25 => 3.67>3.61 today.  BP stable.  As above, creatinine higher but CVP also starting to rise.    - Lasix  80 mg IV x 1.  3. Pulmonary hypertension: RHC showed severe PAH.  She had a V/Q scan earlier this year with no sign of chronic PE.  She is on home oxygen  and Bipap at night.  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  - I suspect that she would not get much benefit from pulmonary  vasodilators.  If BP remains stable, could consider trial of sildenafil 20 tid to see if she has any symptomatic improvement.  - Continue oxygen  during the day, Bipap at night.  - Sent rheumatalogic serologies (?WHO group 1 PH component).   Social/Dispo: She asks when she can go home. We discussed the challenging nature of her AKI/CKD and RV failure. We discussed she is still on milrinone. May be helpful to engage palliative care just for GOC discussion.    Length of Stay: 17  Swade Shonka A Angelissa Supan, MD  12/16/2023, 11:44 AM

## 2023-12-16 NOTE — Progress Notes (Signed)
   12/16/23 2320  BiPAP/CPAP/SIPAP  BiPAP/CPAP/SIPAP Pt Type Adult  BiPAP/CPAP/SIPAP Resmed  Mask Type Full face mask  Dentures removed? Not applicable  Mask Size Medium  Respiratory Rate 20 breaths/min  IPAP 12 cmH20  EPAP 6 cmH2O  FiO2 (%) 40 %  Flow Rate 4 lpm  Patient Home Machine No  Patient Home Mask No  Patient Home Tubing No  Auto Titrate No  Device Plugged into RED Power Outlet Yes  BiPAP/CPAP /SiPAP Vitals  Resp 17  Bilateral Breath Sounds Diminished;Clear  MEWS Score/Color  MEWS Score 2  MEWS Score Color Yellow

## 2023-12-16 NOTE — Consult Note (Addendum)
 WOC Nurse Consult Note: Reason for Consult: Requested to assess thighs and sacral injuries.   Please note that the [facility] nursing team is utilizing a standardized work plan to manage patient consults. We are triaging consults and will try to see the patients within 48 hours. Wound photos in the patient's chart allow us  to consult on the patient in the most efficient and timely manner.    Thank-you,  Lela Holm RN, CNS, [employer], MSN.  (Phone [phone])

## 2023-12-16 NOTE — Plan of Care (Signed)
  Problem: Nutrition: Goal: Adequate nutrition will be maintained Outcome: Completed/Met   Problem: Elimination: Goal: Will not experience complications related to bowel motility Outcome: Completed/Met Goal: Will not experience complications related to urinary retention Outcome: Completed/Met

## 2023-12-16 NOTE — Plan of Care (Signed)
  Problem: Nutritional: Goal: Maintenance of adequate nutrition will improve Outcome: Progressing   Problem: Tissue Perfusion: Goal: Adequacy of tissue perfusion will improve Outcome: Progressing   Problem: Clinical Measurements: Goal: Will remain free from infection Outcome: Progressing Goal: Respiratory complications will improve Outcome: Progressing Goal: Cardiovascular complication will be avoided Outcome: Progressing   Problem: Elimination: Goal: Will not experience complications related to urinary retention Outcome: Progressing   Problem: Safety: Goal: Ability to remain free from injury will improve Outcome: Progressing

## 2023-12-17 ENCOUNTER — Inpatient Hospital Stay (HOSPITAL_COMMUNITY)

## 2023-12-17 DIAGNOSIS — J9601 Acute respiratory failure with hypoxia: Secondary | ICD-10-CM | POA: Diagnosis not present

## 2023-12-17 LAB — PROCALCITONIN: Procalcitonin: 0.41 ng/mL

## 2023-12-17 LAB — COOXEMETRY PANEL
Carboxyhemoglobin: 1.7 % — ABNORMAL HIGH (ref 0.5–1.5)
Methemoglobin: 1.9 % — ABNORMAL HIGH (ref 0.0–1.5)
O2 Saturation: 69.2 %
Total hemoglobin: 8.8 g/dL — ABNORMAL LOW (ref 12.0–16.0)

## 2023-12-17 LAB — BASIC METABOLIC PANEL WITH GFR
Anion gap: 14 (ref 5–15)
BUN: 105 mg/dL — ABNORMAL HIGH (ref 8–23)
CO2: 32 mmol/L (ref 22–32)
Calcium: 9.3 mg/dL (ref 8.9–10.3)
Chloride: 88 mmol/L — ABNORMAL LOW (ref 98–111)
Creatinine, Ser: 4.32 mg/dL — ABNORMAL HIGH (ref 0.44–1.00)
GFR, Estimated: 10 mL/min — ABNORMAL LOW (ref >60)
Glucose, Bld: 132 mg/dL — ABNORMAL HIGH (ref 70–99)
Potassium: 3.7 mmol/L (ref 3.5–5.1)
Sodium: 134 mmol/L — ABNORMAL LOW (ref 135–145)

## 2023-12-17 LAB — CBC
HCT: 27.7 % — ABNORMAL LOW (ref 36.0–46.0)
Hemoglobin: 8.1 g/dL — ABNORMAL LOW (ref 12.0–15.0)
MCH: 21.7 pg — ABNORMAL LOW (ref 26.0–34.0)
MCHC: 29.2 g/dL — ABNORMAL LOW (ref 30.0–36.0)
MCV: 74.3 fL — ABNORMAL LOW (ref 80.0–100.0)
Platelets: 106 K/uL — ABNORMAL LOW (ref 150–400)
RBC: 3.73 MIL/uL — ABNORMAL LOW (ref 3.87–5.11)
RDW: 20 % — ABNORMAL HIGH (ref 11.5–15.5)
WBC: 22.1 K/uL — ABNORMAL HIGH (ref 4.0–10.5)
nRBC: 0.1 % (ref 0.0–0.2)

## 2023-12-17 LAB — GLUCOSE, CAPILLARY
Glucose-Capillary: 116 mg/dL — ABNORMAL HIGH (ref 70–99)
Glucose-Capillary: 120 mg/dL — ABNORMAL HIGH (ref 70–99)
Glucose-Capillary: 144 mg/dL — ABNORMAL HIGH (ref 70–99)
Glucose-Capillary: 228 mg/dL — ABNORMAL HIGH (ref 70–99)
Glucose-Capillary: 183 mg/dL — ABNORMAL HIGH (ref 70–99)

## 2023-12-17 LAB — LACTIC ACID, PLASMA
Lactic Acid, Venous: 1.1 mmol/L (ref 0.5–1.9)
Lactic Acid, Venous: 1.2 mmol/L (ref 0.5–1.9)

## 2023-12-17 MED ORDER — MIDODRINE HCL 5 MG PO TABS
10.0000 mg | ORAL_TABLET | Freq: Three times a day (TID) | ORAL | Status: DC
  Administered 2023-12-17: 10 mg via ORAL
  Administered 2023-12-17: 5 mg via ORAL
  Administered 2023-12-18 – 2023-12-20 (×7): 10 mg via ORAL
  Filled 2023-12-17 (×9): qty 2

## 2023-12-17 MED ORDER — PREGABALIN 50 MG PO CAPS
50.0000 mg | ORAL_CAPSULE | Freq: Every day | ORAL | Status: DC
Start: 2023-12-18 — End: 2023-12-28
  Administered 2023-12-18 – 2023-12-28 (×10): 50 mg via ORAL
  Filled 2023-12-17 (×3): qty 1
  Filled 2023-12-17: qty 2
  Filled 2023-12-17 (×7): qty 1

## 2023-12-17 MED ORDER — SODIUM CHLORIDE 0.9 % IV SOLN
2.0000 g | INTRAVENOUS | Status: DC
  Administered 2023-12-17 – 2023-12-20 (×4): 2 g via INTRAVENOUS
  Filled 2023-12-17 (×4): qty 20

## 2023-12-17 MED ORDER — LINEZOLID 600 MG/300ML IV SOLN
600.0000 mg | Freq: Two times a day (BID) | INTRAVENOUS | Status: DC
  Administered 2023-12-17 – 2023-12-20 (×7): 600 mg via INTRAVENOUS
  Filled 2023-12-17 (×8): qty 300

## 2023-12-17 MED ORDER — INSULIN GLARGINE-YFGN 100 UNIT/ML ~~LOC~~ SOLN
25.0000 [IU] | Freq: Every day | SUBCUTANEOUS | Status: DC
  Administered 2023-12-18 – 2023-12-21 (×4): 25 [IU] via SUBCUTANEOUS
  Filled 2023-12-17 (×4): qty 0.25

## 2023-12-17 NOTE — Consult Note (Signed)
 WOC team requesting photos of sacral/thigh wounds again (see note 10/25).  Patient is refusing to have photos taken of wounds per bedside nurse.  Patient will be seen Monday 12/18/2023 when WOC RN back on campus.   Thank you,    Powell Bar MSN, RN-BC, [employer]

## 2023-12-17 NOTE — Progress Notes (Addendum)
 PROGRESS NOTE    [name] [name]  FMW:[id number] DOB: Jun 29, 1955 DOA: 11/29/2023 PCP: [facility]   68/F morbidly obese with OSA OHS, diastolic CHF, pulmonary hypertension, RV failure, CKD 4, bedbound x3 years >admitted to [facility] with CHF exacerbation, complicated by low output started on milrinone and Lasix  gtt. - Volume status improving,  - Complicated by worsening AKI - Multifactorial leukocytosis as well  Subjective: - Feels fair, no significant events overnight, used her CPAP, oral intake is fair  Assessment and Plan:  Acute on chronic diastolic CHF, RV failure Severe pulmonary hypertension -Echo this admission with EF 60-65, mildly reduced RV, severely elevated PASP -Heart failure team following, diuresed with Lasix  gtt. and milrinone for RV support -RHC 10/22 noted significantly elevated RV filling pressures, wedge only 12 -Unfortunately with worsening AKI, received IV Lasix  yesterday, BP is soft, volume status difficult to assess, check CVP -Continuing milrinone with AKI/CKD, GDMT limited - She would be a poor dialysis candidate should her kidney function not recover  AKI on CKD stage 4 Baseline creatinine around 2.1. -Creatinine needs to worsen, likely combination of cardiorenal, soft BPs also contributing -Increase midodrine  dose - See discussion above, holding further diuretics today  Acute hypoxic and hypercarbic respiratory failure OSA OHS, morbid obesity with BMI of 49 -On 4 to 5 L O2 at baseline Initial ABG showed pCO2 of 73.  Improving with BiPAP nightly. -Clinically do not suspect COPD exacerbation , stopped IV Solu-Medrol , add prednisone for 2 days and then discontinue  - Continue Pulmicort Brovana DuoNeb, Claritin   - Emphasized importance of BiPAP nightly   Leukocytosis -Afebrile and nontoxic, prednisone could be contributing as well, now discontinued - Foley catheter removed yesterday, follow-up urine cultures - Sacral wounds are very  superficial, do not suspect infection here -Has a PICC line, will check blood cultures, DC PICC line tomorrow if leukocytosis persists, unfortunately requires milrinone at this time - Procalcitonin mildly elevated, check lactic acid, Will add empiric ABX for now   Hypokalemia.   Replaced.   Diarrhea.  Intermittent. Uncertain etiology.  Add fiber, discontinue rectal tube GI pathogen panel negative.   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 -Continue Lantus , DC meal coverage  Diabetic neuropathy. On Lyrica .  Dose adjusted for renal dysfunction and mentation.   Left breast cellulitis versus breast swelling secondary to dependent edema Treated with linezolid , completed 7-day treatment course.  Improved General Surgery was consulted, recommended nursing place several folded towels under left breast to keep it elevated.   Toxic metabolic encephalopathy secondary to hypoxia, hypercarbia as well as Lyrica . Mentation improving after adjusting Lyrica  dose and BiPAP therapy. Improved, communicative and oriented.   Chronic hypotension Currently on midodrine , milrinone and Lasix .    Hyperlipidemia Continue Lipitor   Obesity Class 3 Body mass index is 49.52 kg/m (pended).  Placing the pt at higher risk of poor outcomes.   Bedbound  due to previous history of lower extremity fracture.   Anxiety/depression Continue Zoloft.     Anisocoria. Left pupil more than right. CT scan of the head negative for any acute abnormality.  No other focal deficit.  Monitor.    DVT prophylaxis: Heparin  subcutaneous Code Status: Full code Family Communication: None present Disposition Plan: To be determined  Consultants:    Procedures:   Antimicrobials:    Objective: Vitals:   12/17/23 0500 12/17/23 0709 12/17/23 0746 12/17/23 1029  BP:  (!) 91/53  (!) 99/50  Pulse:  (!) 111  Resp:  15  (!) 21  Temp:  98.4 F (36.9 C)     TempSrc:  Oral    SpO2:  94% 94%   Weight: 121.4 kg     Height:        Intake/Output Summary (Last 24 hours) at 12/17/2023 1048 Last data filed at 12/17/2023 0847 Gross per 24 hour  Intake 675.29 ml  Output 500 ml  Net 175.29 ml   Filed Weights   12/15/23 0446 12/16/23 0432 12/17/23 0500  Weight: 122.9 kg 122 kg 121.4 kg    Examination:  General exam: Morbidly obese chronically ill, AAO x 3 HEENT: Neck obese unable to assess JVD Respiratory system: Distant breath sounds otherwise clear Cardiovascular system: S1 & S2 heard, RRR.  Abd: nondistended, soft and nontender.Normal bowel sounds heard. Extremities: Trace edema Skin: No rashes Psychiatry:  Mood & affect appropriate.     Data Reviewed:   CBC: Recent Labs  Lab 12/13/23 1031 12/13/23 1522 12/13/23 1523 12/16/23 0450 12/17/23 0455  WBC 14.9*  --   --  17.9* 22.1*  NEUTROABS 13.5*  --   --   --   --   HGB 10.2* 13.3 13.3 9.3* 8.1*  HCT 34.8* 39.0 39.0 31.5* 27.7*  MCV 75.0*  --   --  73.6* 74.3*  PLT 132*  --   --  89* 106*   Basic Metabolic Panel: Recent Labs  Lab 12/13/23 0507 12/13/23 1522 12/13/23 1523 12/14/23 0638 12/15/23 0500 12/16/23 0450 12/17/23 0455  NA 138   < > 135 139 132* 131* 134*  K 3.9   < > 4.2 4.1 4.3 3.9 3.7  CL 90*  --   --  91* 86* 85* 88*  CO2 35*  --   --  33* 32 31 32  GLUCOSE 168*  --   --  166* 273* 191* 132*  BUN 85*  --   --  90* 96* 96* 105*  CREATININE 2.86*  --   --  3.25* 3.67* 3.61* 4.32*  CALCIUM  10.2  --   --  9.5 9.4 9.3 9.3   < > = values in this interval not displayed.   GFR: Estimated Creatinine Clearance: 15.2 mL/min (A) (by C-G formula based on SCr of 4.32 mg/dL (H)). Liver Function Tests: No results for input(s): AST, ALT, ALKPHOS, BILITOT, PROT, ALBUMIN  in the last 168 hours.  No results for input(s): LIPASE, AMYLASE in the last 168 hours. No results for input(s): AMMONIA in the last 168 hours. Coagulation Profile: Recent  Labs  Lab 12/13/23 1031  INR 1.1   Cardiac Enzymes: No results for input(s): CKTOTAL, CKMB, CKMBINDEX, TROPONINI in the last 168 hours. BNP (last 3 results) Recent Labs    11/29/23 1531 11/30/23 0300 12/11/23 1114  PROBNP 1,820.0* 1,624.0* 5,609.0*   HbA1C: No results for input(s): HGBA1C in the last 72 hours. CBG: Recent Labs  Lab 12/16/23 1113 12/16/23 1615 12/16/23 2029 12/17/23 0616 12/17/23 0623  GLUCAP 207* 221* 168* 120* 116*   Lipid Profile: No results for input(s): CHOL, HDL, LDLCALC, TRIG, CHOLHDL, LDLDIRECT in the last 72 hours. Thyroid Function Tests: No results for input(s): TSH, T4TOTAL, FREET4, T3FREE, THYROIDAB in the last 72 hours. Anemia Panel: No results for input(s): VITAMINB12, FOLATE, FERRITIN, TIBC, IRON, RETICCTPCT in the last 72 hours.  Urine analysis:    Component Value Date/Time   COLORURINE YELLOW 12/16/2023 0843   APPEARANCEUR HAZY (A) 12/16/2023 0843   LABSPEC 1.011 12/16/2023 0843   PHURINE 7.0 12/16/2023  0843   GLUCOSEU NEGATIVE 12/16/2023 0843   HGBUR LARGE (A) 12/16/2023 0843   BILIRUBINUR NEGATIVE 12/16/2023 0843   KETONESUR NEGATIVE 12/16/2023 0843   PROTEINUR 100 (A) 12/16/2023 0843   UROBILINOGEN 0.2 06/28/2014 1500   NITRITE NEGATIVE 12/16/2023 0843   LEUKOCYTESUR LARGE (A) 12/16/2023 0843   Sepsis Labs: @LABRCNTIP (procalcitonin:4,lacticidven:4)  ) Recent Results (from the past 240 hours)  Gastrointestinal Panel by PCR , Stool     Status: None   Collection Time: 12/07/23  1:12 PM   Specimen: Stool  Result Value Ref Range Status   Campylobacter species NOT DETECTED NOT DETECTED Final   Plesimonas shigelloides NOT DETECTED NOT DETECTED Final   Salmonella species NOT DETECTED NOT DETECTED Final   Yersinia enterocolitica NOT DETECTED NOT DETECTED Final   Vibrio species NOT DETECTED NOT DETECTED Final   Vibrio cholerae NOT DETECTED NOT DETECTED Final   Enteroaggregative E  coli (EAEC) NOT DETECTED NOT DETECTED Final   Enteropathogenic E coli (EPEC) NOT DETECTED NOT DETECTED Final   Enterotoxigenic E coli (ETEC) NOT DETECTED NOT DETECTED Final   Shiga like toxin producing E coli (STEC) NOT DETECTED NOT DETECTED Final   Shigella/Enteroinvasive E coli (EIEC) NOT DETECTED NOT DETECTED Final   Cryptosporidium NOT DETECTED NOT DETECTED Final   Cyclospora cayetanensis NOT DETECTED NOT DETECTED Final   Entamoeba histolytica NOT DETECTED NOT DETECTED Final   Giardia lamblia NOT DETECTED NOT DETECTED Final   Adenovirus F40/41 NOT DETECTED NOT DETECTED Final   Astrovirus NOT DETECTED NOT DETECTED Final   Norovirus GI/GII NOT DETECTED NOT DETECTED Final   Rotavirus A NOT DETECTED NOT DETECTED Final   Sapovirus (I, II, IV, and V) NOT DETECTED NOT DETECTED Final    Comment: Performed at [facility], [address] [place], [place] [address]     Radiology Studies: No results found.    Scheduled Meds:  acidophilus  2 capsule Oral TID   allopurinol   100 mg Oral QHS   alteplase  2 mg Intracatheter Once   arformoterol  15 mcg Nebulization BID   atorvastatin   10 mg Oral QHS   budesonide (PULMICORT) nebulizer solution  0.5 mg Nebulization BID   calcitRIOL  0.25 mcg Oral Q M,W,F   Chlorhexidine  Gluconate Cloth  6 each Topical Daily   diclofenac Sodium  2 g Topical QID   fluticasone  2 spray Each Nare Daily   insulin  aspart  0-20 Units Subcutaneous TID WC   insulin  aspart  0-5 Units Subcutaneous QHS   insulin  aspart  3 Units Subcutaneous TID WC   insulin  glargine-yfgn  35 Units Subcutaneous Daily   ipratropium-albuterol   3 mL Nebulization BID   loratadine   10 mg Oral Daily   nystatin  cream  1 Application Topical BID   oxyCODONE   10 mg Oral BID   pantoprazole   40 mg Oral QAC breakfast   polycarbophil  625 mg Oral Daily   polyethylene glycol  17 g Oral Daily   pregabalin   75 mg Oral Daily   sertraline  50 mg Oral q AM   sodium chloride  flush  10-40 mL  Intracatheter Q12H   triamcinolone  1 Application Topical Daily   Continuous Infusions:  milrinone 0.25 mcg/kg/min (12/17/23 0404)     LOS: 18 days    Time spent:    Sigurd Pac, MD [facility]   12/17/2023, 10:48 AM

## 2023-12-17 NOTE — Progress Notes (Signed)
 Patient ID: [name] [name], female   DOB: 1955/09/02, 68 y.o.   MRN: [id number]      Cardiologist: Jerel Balding, MD  Chief Complaint: CHF Subjective:    Co-ox 68%, milrinone 0.25 ongoing.  Creatinie 2.86 => 3.25 => 3.67> 3.61>4.3.  CVP 12.  UOP 700 cc.   RHC Procedural Findings (milrinone 0.25), 10/22: Hemodynamics (mmHg) RA mean 17 RV 70/20 PA 67/31, mean 47 PCWP mean 12 Oxygen  saturations: PA 67% AO 100% Cardiac Output (Fick) 6.33  Cardiac Index (Fick) 2.91  PVR 5.5 WU    Objective:   Weight Range: 121.4 kg Body mass index is 48.95 kg/m (pended).   Vital Signs:   Temp:  [98 F (36.7 C)-98.4 F (36.9 C)] 98.1 F (36.7 C) (10/26 1147) Pulse Rate:  [106-127] 111 (10/26 0709) Resp:  [15-21] 21 (10/26 1029) BP: (91-127)/(50-60) 99/50 (10/26 1029) SpO2:  [90 %-95 %] 94 % (10/26 0746) FiO2 (%):  [40 %] 40 % (10/25 2320) Weight:  [121.4 kg] 121.4 kg (10/26 0500) Last BM Date : 12/16/23  Weight change: Filed Weights   12/15/23 0446 12/16/23 0432 12/17/23 0500  Weight: 122.9 kg 122 kg 121.4 kg    Intake/Output:   Intake/Output Summary (Last 24 hours) at 12/17/2023 1155 Last data filed at 12/17/2023 0847 Gross per 24 hour  Intake 675.29 ml  Output 500 ml  Net 175.29 ml      Physical Exam    General: NAD Neck: Thick, JVP difficult Lungs: Clear to auscultation bilaterally with normal respiratory effort. CV: Heart regular S1/S2, no S3/S4, no murmur.  1+ ankle edema.  Abdomen: Soft, nontender no distention.  Skin: Intact without lesions or rashes.  Neurologic: Alert and oriented x 3.  Psych: Normal affect. Extremities: No clubbing or cyanosis.  HEENT: Normal.   Telemetry   Sinus tachycardia rates 120s  Labs    CBC Recent Labs    12/16/23 0450 12/17/23 0455  WBC 17.9* 22.1*  HGB 9.3* 8.1*  HCT 31.5* 27.7*  MCV 73.6* 74.3*  PLT 89* 106*   Basic Metabolic Panel Recent Labs    89/74/74 0450 12/17/23 0455  NA 131* 134*  K 3.9 3.7  CL  85* 88*  CO2 31 32  GLUCOSE 191* 132*  BUN 96* 105*  CREATININE 3.61* 4.32*  CALCIUM  9.3 9.3   Liver Function Tests No results for input(s): AST, ALT, ALKPHOS, BILITOT, PROT, ALBUMIN  in the last 72 hours. No results for input(s): LIPASE, AMYLASE in the last 72 hours. Cardiac Enzymes No results for input(s): CKTOTAL, CKMB, CKMBINDEX, TROPONINI in the last 72 hours.  BNP: BNP (last 3 results) Recent Labs    01/13/23 1811 02/15/23 2105 03/27/23 1622  BNP 334.2* 432.5* 462.5*    ProBNP (last 3 results) Recent Labs    11/29/23 1531 11/30/23 0300 12/11/23 1114  PROBNP 1,820.0* 1,624.0* 5,609.0*     D-Dimer No results for input(s): DDIMER in the last 72 hours. Hemoglobin A1C No results for input(s): HGBA1C in the last 72 hours. Fasting Lipid Panel No results for input(s): CHOL, HDL, LDLCALC, TRIG, CHOLHDL, LDLDIRECT in the last 72 hours. Thyroid Function Tests No results for input(s): TSH, T4TOTAL, T3FREE, THYROIDAB in the last 72 hours.  Invalid input(s): FREET3  Other results:   Imaging    No results found.    Medications:     Scheduled Medications:  acidophilus  2 capsule Oral TID   allopurinol   100 mg Oral QHS   alteplase  2 mg Intracatheter Once  arformoterol  15 mcg Nebulization BID   atorvastatin   10 mg Oral QHS   budesonide (PULMICORT) nebulizer solution  0.5 mg Nebulization BID   calcitRIOL  0.25 mcg Oral Q M,W,F   Chlorhexidine  Gluconate Cloth  6 each Topical Daily   diclofenac Sodium  2 g Topical QID   fluticasone  2 spray Each Nare Daily   insulin  aspart  0-20 Units Subcutaneous TID WC   insulin  aspart  0-5 Units Subcutaneous QHS   [START ON 12/18/2023] insulin  glargine-yfgn  25 Units Subcutaneous Daily   ipratropium-albuterol   3 mL Nebulization BID   loratadine   10 mg Oral Daily   midodrine   10 mg Oral TID WC   nystatin  cream  1 Application Topical BID   oxyCODONE   10 mg Oral BID    pantoprazole   40 mg Oral QAC breakfast   polycarbophil  625 mg Oral Daily   polyethylene glycol  17 g Oral Daily   [START ON 12/18/2023] pregabalin   50 mg Oral Daily   sertraline  50 mg Oral q AM   sodium chloride  flush  10-40 mL Intracatheter Q12H   triamcinolone  1 Application Topical Daily    Infusions:  cefTRIAXone  (ROCEPHIN )  IV     linezolid  (ZYVOX ) IV     milrinone 0.25 mcg/kg/min (12/17/23 0404)    PRN Medications: acetaminophen  **OR** acetaminophen , albuterol , camphor-menthol , liver oil-zinc  oxide, methocarbamol, naphazoline-pheniramine, nystatin , ondansetron  **OR** ondansetron  (ZOFRAN ) IV, mouth rinse, oxyCODONE , sodium chloride  flush, traZODone     Assessment/Plan   1. 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  Echo this admission with EF 60-65%, mild RV dilation/mild RV dysfunction.  She was started on milrinone for RV support and diuresed extensively.  RHC 10/22 showed that she still has significantly elevated right-sided filling pressures but with PCWP only 12.  Lasix  gtt at 15 mg/hr started.  This was stopped with rise in creatinine. This morning, co-ox 68% on milrinone 0.25 with creatinine 2.86 => 3.25 => 3.67>3.61>4.3. CVP 12. - Renal function responded poorly to additional diuretic. Hold lasix  and continue milrinone.  - Continue milrinone 0.25 mcg/kg/min as we allow creatinine to equilibrate.  - No SGLT2 inhibitor or aldosterone antagonist with AKI.  2. AKI on CKD stage IV: Cardiorenal syndrome in setting of RV failure.  Creatinine increasing, 2.89 => 3.25 => 3.67>3.61>4.3 today.  BP stable.  3. Pulmonary hypertension: RHC showed severe PAH.  She had a V/Q scan earlier this year with no sign of chronic PE.  She is on home oxygen  and Bipap at night.  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  - I suspect that she would not get much benefit  from pulmonary vasodilators.  If BP remains stable, could consider trial of sildenafil 20 tid to see if she has any symptomatic improvement.  - Continue oxygen  during the day, Bipap at night.  - Sent rheumatalogic serologies (?WHO group 1 PH component). Negative ANA. Neg RF, Neg scl 70, neg centroeme ab.  Social/Dispo: She asks when she can go home. We discussed the challenging nature of her AKI/CKD and RV failure. We discussed she is still on milrinone. May be helpful to engage palliative care just for GOC discussion.   AHF service to resume care tomorrow, unclear target for milrinone to be weaned.   Length of Stay: 18  Soyla DELENA Merck, MD  12/17/2023, 11:55 AM

## 2023-12-17 NOTE — Plan of Care (Signed)
  Problem: Tissue Perfusion: Goal: Adequacy of tissue perfusion will improve Outcome: Progressing   Problem: Clinical Measurements: Goal: Will remain free from infection Outcome: Progressing Goal: Cardiovascular complication will be avoided Outcome: Progressing   Problem: Coping: Goal: Level of anxiety will decrease Outcome: Progressing   Problem: Safety: Goal: Ability to remain free from injury will improve Outcome: Progressing

## 2023-12-17 NOTE — Progress Notes (Signed)
   12/16/23 2016  Assess: MEWS Score  Temp 98.1 F (36.7 C)  BP (!) 127/52  MAP (mmHg) 72  Pulse Rate (!) 127  ECG Heart Rate (!) 127  Resp 18  Level of Consciousness Alert  SpO2 91 %  O2 Device Nasal Cannula  Patient Activity (if Appropriate) In bed  O2 Flow Rate (L/min) 4 L/min  Assess: MEWS Score  MEWS Temp 0  MEWS Systolic 0  MEWS Pulse 2  MEWS RR 0  MEWS LOC 0  MEWS Score 2  MEWS Score Color Yellow  Assess: if the MEWS score is Yellow or Red  Were vital signs accurate and taken at a resting state? Yes  Does the patient meet 2 or more of the SIRS criteria? No  Does the patient have a confirmed or suspected source of infection? No  MEWS guidelines implemented  No, previously yellow, continue vital signs every 4 hours  Notify: Charge Nurse/RN  Name of Charge Nurse/RN Notified Tisha, RN  Provider Notification  Provider Name/Title Dr. Milissa  Date Provider Notified 12/16/23  Time Provider Notified 2132  Method of Notification Page  Notification Reason Change in status (HR ranges 120-130's)  Provider response Other (Comment) (EKG)  Date of Provider Response 12/16/23  Time of Provider Response 2204  Assess: SIRS CRITERIA  SIRS Temperature  0  SIRS Respirations  0  SIRS Pulse 1  SIRS WBC 0  SIRS Score Sum  1

## 2023-12-17 NOTE — Progress Notes (Signed)
 Placed patient on bipap for the night with oxygen set at 4lpm

## 2023-12-18 ENCOUNTER — Inpatient Hospital Stay (HOSPITAL_COMMUNITY)

## 2023-12-18 DIAGNOSIS — N179 Acute kidney failure, unspecified: Secondary | ICD-10-CM | POA: Diagnosis not present

## 2023-12-18 DIAGNOSIS — I5033 Acute on chronic diastolic (congestive) heart failure: Secondary | ICD-10-CM | POA: Diagnosis not present

## 2023-12-18 DIAGNOSIS — D696 Thrombocytopenia, unspecified: Secondary | ICD-10-CM

## 2023-12-18 DIAGNOSIS — J9620 Acute and chronic respiratory failure, unspecified whether with hypoxia or hypercapnia: Secondary | ICD-10-CM | POA: Diagnosis not present

## 2023-12-18 DIAGNOSIS — Z515 Encounter for palliative care: Secondary | ICD-10-CM

## 2023-12-18 DIAGNOSIS — D72829 Elevated white blood cell count, unspecified: Secondary | ICD-10-CM

## 2023-12-18 DIAGNOSIS — G9341 Metabolic encephalopathy: Secondary | ICD-10-CM | POA: Diagnosis not present

## 2023-12-18 DIAGNOSIS — N1832 Chronic kidney disease, stage 3b: Secondary | ICD-10-CM | POA: Diagnosis not present

## 2023-12-18 DIAGNOSIS — I50810 Right heart failure, unspecified: Secondary | ICD-10-CM | POA: Diagnosis not present

## 2023-12-18 DIAGNOSIS — I272 Pulmonary hypertension, unspecified: Secondary | ICD-10-CM | POA: Diagnosis not present

## 2023-12-18 DIAGNOSIS — J9601 Acute respiratory failure with hypoxia: Secondary | ICD-10-CM | POA: Diagnosis not present

## 2023-12-18 LAB — FOLATE: Folate: 7.8 ng/mL (ref >5.9)

## 2023-12-18 LAB — URINE CULTURE

## 2023-12-18 LAB — GLUCOSE, CAPILLARY
Glucose-Capillary: 183 mg/dL — ABNORMAL HIGH (ref 70–99)
Glucose-Capillary: 186 mg/dL — ABNORMAL HIGH (ref 70–99)
Glucose-Capillary: 174 mg/dL — ABNORMAL HIGH (ref 70–99)
Glucose-Capillary: 145 mg/dL — ABNORMAL HIGH (ref 70–99)
Glucose-Capillary: 125 mg/dL — ABNORMAL HIGH (ref 70–99)
Glucose-Capillary: 114 mg/dL — ABNORMAL HIGH (ref 70–99)

## 2023-12-18 LAB — BASIC METABOLIC PANEL WITH GFR
Anion gap: 15 (ref 5–15)
BUN: 116 mg/dL — ABNORMAL HIGH (ref 8–23)
CO2: 29 mmol/L (ref 22–32)
Calcium: 9.1 mg/dL (ref 8.9–10.3)
Chloride: 88 mmol/L — ABNORMAL LOW (ref 98–111)
Creatinine, Ser: 5.26 mg/dL — ABNORMAL HIGH (ref 0.44–1.00)
GFR, Estimated: 8 mL/min — ABNORMAL LOW (ref >60)
Glucose, Bld: 181 mg/dL — ABNORMAL HIGH (ref 70–99)
Potassium: 3.7 mmol/L (ref 3.5–5.1)
Sodium: 132 mmol/L — ABNORMAL LOW (ref 135–145)

## 2023-12-18 LAB — MRSA NEXT GEN BY PCR, NASAL: MRSA by PCR Next Gen: NOT DETECTED

## 2023-12-18 LAB — CBC
HCT: 27.1 % — ABNORMAL LOW (ref 36.0–46.0)
Hemoglobin: 7.7 g/dL — ABNORMAL LOW (ref 12.0–15.0)
MCH: 21.6 pg — ABNORMAL LOW (ref 26.0–34.0)
MCHC: 28.4 g/dL — ABNORMAL LOW (ref 30.0–36.0)
MCV: 75.9 fL — ABNORMAL LOW (ref 80.0–100.0)
Platelets: 136 K/uL — ABNORMAL LOW (ref 150–400)
RBC: 3.57 MIL/uL — ABNORMAL LOW (ref 3.87–5.11)
RDW: 20.4 % — ABNORMAL HIGH (ref 11.5–15.5)
WBC: 23.7 K/uL — ABNORMAL HIGH (ref 4.0–10.5)
nRBC: 0.4 % — ABNORMAL HIGH (ref 0.0–0.2)

## 2023-12-18 LAB — FERRITIN: Ferritin: 173 ng/mL (ref 11–307)

## 2023-12-18 LAB — IRON AND TIBC
Iron: 32 ug/dL (ref 28–170)
Saturation Ratios: 9 % — ABNORMAL LOW (ref 10.4–31.8)
TIBC: 357 ug/dL (ref 250–450)
UIBC: 325 ug/dL

## 2023-12-18 LAB — COOXEMETRY PANEL
Carboxyhemoglobin: 1.6 % — ABNORMAL HIGH (ref 0.5–1.5)
Methemoglobin: 1.4 % (ref 0.0–1.5)
O2 Saturation: 70.8 %
Total hemoglobin: 8.2 g/dL — ABNORMAL LOW (ref 12.0–16.0)

## 2023-12-18 LAB — MAGNESIUM: Magnesium: 1.5 mg/dL — ABNORMAL LOW (ref 1.7–2.4)

## 2023-12-18 LAB — DIC (DISSEMINATED INTRAVASCULAR COAGULATION)PANEL
D-Dimer, Quant: 0.59 ug{FEU}/mL — ABNORMAL HIGH (ref 0.00–0.50)
Fibrinogen: 679 mg/dL — ABNORMAL HIGH (ref 210–475)
INR: 1.2 (ref 0.8–1.2)
Platelets: 134 K/uL — ABNORMAL LOW (ref 150–400)
Prothrombin Time: 15.4 s — ABNORMAL HIGH (ref 11.4–15.2)
Smear Review: NONE SEEN
aPTT: 30 s (ref 24–36)

## 2023-12-18 LAB — VITAMIN B12: Vitamin B-12: 549 pg/mL (ref 180–914)

## 2023-12-18 LAB — PROCALCITONIN: Procalcitonin: 1 ng/mL

## 2023-12-18 LAB — RETICULOCYTES
Immature Retic Fract: 44.1 % — ABNORMAL HIGH (ref 2.3–15.9)
RBC.: 3.61 MIL/uL — ABNORMAL LOW (ref 3.87–5.11)
Retic Count, Absolute: 7.6 K/uL — ABNORMAL LOW (ref 19.0–186.0)
Retic Ct Pct: <0.4 % — ABNORMAL LOW (ref 0.4–3.1)

## 2023-12-18 LAB — AMMONIA: Ammonia: 29 umol/L (ref 9–35)

## 2023-12-18 MED ORDER — GERHARDT'S BUTT CREAM
TOPICAL_CREAM | Freq: Two times a day (BID) | CUTANEOUS | Status: DC
  Administered 2023-12-19 – 2023-12-24 (×7): 1 via TOPICAL
  Filled 2023-12-18 (×3): qty 60

## 2023-12-18 MED ORDER — DOBUTAMINE-DEXTROSE 4-5 MG/ML-% IV SOLN
2.5000 ug/kg/min | INTRAVENOUS | Status: DC
  Administered 2023-12-18 – 2023-12-27 (×5): 2.5 ug/kg/min via INTRAVENOUS
  Filled 2023-12-18 (×5): qty 250

## 2023-12-18 MED ORDER — PRISMASOL BGK 4/2.5 32-4-2.5 MEQ/L EC SOLN: Status: DC

## 2023-12-18 MED ORDER — PHENYLEPHRINE HCL-NACL 20-0.9 MG/250ML-% IV SOLN
0.0000 ug/min | INTRAVENOUS | Status: DC
  Administered 2023-12-18: 20 ug/min via INTRAVENOUS
  Filled 2023-12-18: qty 250

## 2023-12-18 MED ORDER — SODIUM CHLORIDE 0.9 % FOR CRRT: INTRAVENOUS_CENTRAL | Status: DC | PRN

## 2023-12-18 MED ORDER — FUROSEMIDE 10 MG/ML IJ SOLN
160.0000 mg | Freq: Once | INTRAVENOUS | Status: AC
  Administered 2023-12-18: 160 mg via INTRAVENOUS
  Filled 2023-12-18: qty 10

## 2023-12-18 MED ORDER — PHENYLEPHRINE CONCENTRATED 100MG/250ML (0.4 MG/ML) INFUSION SIMPLE
0.0000 ug/min | INTRAVENOUS | Status: DC
  Administered 2023-12-18: 20 ug/min via INTRAVENOUS
  Filled 2023-12-18: qty 250

## 2023-12-18 MED ORDER — HEPARIN SODIUM (PORCINE) 1000 UNIT/ML DIALYSIS
1000.0000 [IU] | INTRAMUSCULAR | Status: DC | PRN
  Administered 2023-12-18 – 2023-12-26 (×2): 2400 [IU] via INTRAVENOUS_CENTRAL
  Filled 2023-12-18: qty 5
  Filled 2023-12-18: qty 3
  Filled 2023-12-18: qty 1

## 2023-12-18 NOTE — Consult Note (Signed)
 Consultation Note Date: 12/18/2023   Patient Name: [name] [name]  DOB: Feb 19, 1956  MRN: [id number]  Age / Sex: 68 y.o., female  PCP: [facility] Referring Physician: Fairy Frames, MD  Reason for Consultation: Establishing goals of care  HPI/Patient Profile: 68 y.o. female  with past medical history of  insulin -dependent type 2 diabetes, chronic heart failure with preserved EF, morbid obesity, bedbound, pulmonary hypertension admitted on 11/29/2023 with edema and weight gain.   Patient was admitted for acute exacerbation of chronic heart failure with preserved EF.  Her hospital course is complicated by low output state requiring milrinone infusion and lasix  infusion, as well as worsening AKI with Cr of 5.26 increased today from 4.3. Requiring transfer to ICU for CRRT. PMT has been consulted to assist with goals of care conversation.  Clinical Assessment and Goals of Care:  I have reviewed medical records including EPIC notes, labs and imaging, discussed with RN and primary attending, assessed the patient and then met at the bedside with patient's son, significant other, and daughter-in-law to discuss diagnosis prognosis, GOC, EOL wishes, disposition and options.  I introduced Palliative Medicine as specialized medical care for people living with serious illness. It focuses on providing relief from the symptoms and stress of a serious illness. The goal is to improve quality of life for both the patient and the family.  We discussed a brief life review of the patient and then focused on their current illness.  The natural disease trajectory and expectations at EOL were discussed.  I attempted to elicit values and goals of care important to the patient.    Medical History Review and Understanding:  We discussed patient's acute illness in the context of their chronic comorbidities. Patient's family understand the severity of patient's  illness.  Social History: Patient lives at home with several aides assisting her.  She has 1 son.  She has a partner who she has been with for a long time.  Functional and Nutritional State: Patient is bedbound at baseline.  Albumin  of 3.6 on 10/19.  Palliative Symptoms: Edema, dyspnea, left leg pain, irritable bowel  Advance Directives: A detailed discussion regarding advanced directives was had.  Reviewed documentation on file from 02/20/2023.  Patient indicated she would not desire life-prolonging measures if she had a condition that was not curable.  However, she indicated HCPOA (her son) has authority to make different decisions than what she has indicated on her living will.  Code Status: Concepts specific to code status, artifical feeding and hydration, and rehospitalization were considered and discussed.  Patient was able to clearly voice several times during our conversation that she would not want CPR and she would not want a ventilator.  We discussed that in the past, she has stated this and then decided against updating CODE STATUS when her son disagreed.  I encouraged her to discuss again with her son and we attempted to call her from bedside, however we were unable to reach him.  Discussion: Created space and opportunity for patient's thoughts and feelings on her current illness.  She states that since her last conversation with PMT in February, she has broken her leg (reportedly in June) and this has caused her a lot of pain.  She indicated that she did recall previous family meetings about her goals of care.  Shared my concern about her current condition, explaining poor likelihood of intervention such as CRRT changing her overall prognosis or bridging her to intermittent dialysis.  Emphasized the importance  of considering overall quality of life and she tells me she would want to try it in the hospital, but not outpatient dialysis.  During our conversation, PCCM physician came to  evaluate patient and at that time she stated she did not want to do dialysis.  Informed patient that I will continue trying to get in touch with her son to assist her with making these complicated medical decisions.  She said her daughter-in-law will likely be most able to get in touch with him. Returned to the bedside with primary attending Dr. Fairy to revisit CODE STATUS conversation given inability to get in touch with patient's son.  She was agreeable to discussing with her significant other and he was called/provided with an update over the phone.  He was understandably very alarmed about the situation and elected to come to the bedside in person as soon as possible and got off the phone.  Given attempts to contact 2 of patient's family members without a decision, I encouraged patient to allow us  to honor her wishes before she potentially received interventions that she did not want.  Patient then was agreeable to DNR/DNI. Afterwards, I was able to get in touch with patient's son and provide with update on the above.  He stated that he cut his dialysis session short and is driving to come from [place].  He shared his disagreement with DNR/DNI and wants everything done to keep my mother alive. I returned to the bedside again alongside Dr. Gretta I met with patient's son and significant other and daughter-in-law.  Provided support as they were given the update on poor prognosis with or without CRRT, given unlikelihood that she will ever tolerate long-term dialysis. Patient was unable to express consequences of her decision for DNR/DNI despite being oriented x3 early. Code status was changed back to full code accordingly, encouraging family to continue exploring with her given the consistency of her clearly expressed desire for a natural death. They ultimately desired for trial of CRRT understanding the need for further goals of care discussions if she fails to improve.   The difference between  aggressive medical intervention and comfort care was considered in light of the patient's goals of care.  Discussed the importance of continued conversation with family and the medical providers regarding overall plan of care and treatment options, ensuring decisions are within the context of the patient's values and GOCs.   Questions and concerns were addressed.  Hard Choices booklet left for review. The family was encouraged to call with questions or concerns.  PMT will continue to support holistically.   SUMMARY OF RECOMMENDATIONS   -Continue full code for now. Patient clearly voiced preference for DNR/DNI, to have further discussions with family -Continue full scope treatment with transfer to ICU for time-limited trial of CRRT -Ongoing GOC discussions. Family aware of poor overall prognosis and severely limited treatment options -Psychosocial and emotional support provided -PMT will continue to follow and support  Prognosis:  Poor prognosis with several chronic comorbidities and repeated exacerbations   Discharge Planning: To Be Determined      Primary Diagnoses: Present on Admission:  Acute respiratory failure with hypoxia (HCC)  Acute on chronic heart failure with preserved ejection fraction (HFpEF) (HCC)  Anxiety and depression  Hyperglycemia due to type 2 diabetes mellitus (HCC)  Hyperlipidemia associated with type 2 diabetes mellitus (HCC)  Morbid obesity (HCC)  Obesity hypoventilation syndrome (HCC)  OSA (obstructive sleep apnea)  Chronic kidney disease, stage 3b (HCC)  Physical Exam Vitals and nursing note reviewed.  Constitutional:      General: She is not in acute distress.    Appearance: She is ill-appearing.     Interventions: Nasal cannula in place.  Cardiovascular:     Rate and Rhythm: Tachycardia present.  Pulmonary:     Effort: Pulmonary effort is normal. No respiratory distress.  Skin:    General: Skin is warm and dry.  Neurological:     Mental  Status: She is alert and oriented to person, place, and time.  Psychiatric:        Mood and Affect: Affect is flat.        Behavior: Behavior is cooperative.        Cognition and Memory: Cognition is impaired.     Vital Signs: BP 117/86 (BP Location: Left Wrist)   Pulse (!) 116   Temp 98.2 F (36.8 C) (Oral)   Resp 19   Ht (P) 5' 2 (1.575 m)   Wt 121.4 kg   SpO2 92%   BMI (P) 48.95 kg/m  Pain Scale: 0-10 POSS *See Group Information*: S-Acceptable,Sleep, easy to arouse Pain Score: 0-No pain   SpO2: SpO2: 92 % O2 Device:SpO2: 92 % O2 Flow Rate: .O2 Flow Rate (L/min): 5 L/min    Alauna Hayden [name], PA-C  Palliative Medicine Team Team phone # [phone]  Thank you for allowing the Palliative Medicine Team to assist in the care of this patient. Please utilize secure chat with additional questions, if there is no response within 30 minutes please call the above phone number.  Palliative Medicine Team providers are available by phone from 7am to 7pm daily and can be reached through the team cell phone.  Should this patient require assistance outside of these hours, please call the patient's attending physician.     Time Total: 120  Visit consisted of counseling and education dealing with the complex and emotionally intense issues of symptom management and palliative care in the setting of serious and potentially life-threatening illness. Greater than 50% of this time was spent counseling and coordinating care related to the above assessment and plan.  Personally spent 120 minutes in patient care including extensive chart review (labs, imaging, progress/consult notes, vital signs), medically appropraite exam, discussed with treatment team, education to patient, family, and staff, documenting clinical information, medication review and management, coordination of care, and available advanced directive documents.

## 2023-12-18 NOTE — Progress Notes (Signed)
   12/18/23 2200  BiPAP/CPAP/SIPAP  BiPAP/CPAP/SIPAP Pt Type Adult  BiPAP/CPAP/SIPAP Resmed  Mask Type Full face mask  Mask Size Large  Respiratory Rate 15 breaths/min  IPAP 14 cmH20  EPAP 6 cmH2O  Flow Rate 6 lpm  Patient Home Machine No  Patient Home Mask No  Patient Home Tubing No  Auto Titrate No  BiPAP/CPAP /SiPAP Vitals  Pulse Rate (!) 125 (Simultaneous filing. User may not have seen previous data.)  Resp 15 (Simultaneous filing. User may not have seen previous data.)  BP (!) 81/52  SpO2 93 % (Simultaneous filing. User may not have seen previous data.)  Bilateral Breath Sounds Diminished  MEWS Score/Color  MEWS Score 4  MEWS Score Color Red

## 2023-12-18 NOTE — Progress Notes (Addendum)
 Patient ID: [name] [name], female   DOB: 22-Dec-1955, 68 y.o.   MRN: [id number]      Cardiologist: Jerel Balding, MD  Chief Complaint: CHF Subjective:    Co-ox 71%, milrinone 0.25 mcg/kg/min ongoing.  She is also on midodrine  10 mg tid.  Creatinie 2.86 => 3.25 => 3.67 => 3.61 => 4.3 => 5.26.  CVP 16.  ?No UOP recorded.  SBP 90s-100s.    Afebrile but WBCs up to 24K.  PCT was 0.41.  Urine and blood cultures pending.  She is currently on linezolid  + ceftriaxone .   Hgb down to 7.7.   RHC Procedural Findings (milrinone 0.25), 10/22: Hemodynamics (mmHg) RA mean 17 RV 70/20 PA 67/31, mean 47 PCWP mean 12 Oxygen  saturations: PA 67% AO 100% Cardiac Output (Fick) 6.33  Cardiac Index (Fick) 2.91  PVR 5.5 WU    Objective:   Weight Range: 121.4 kg Body mass index is 48.95 kg/m (pended).   Vital Signs:   Temp:  [98 F (36.7 C)-98.6 F (37 C)] 98.2 F (36.8 C) (10/27 0600) Pulse Rate:  [99-118] 99 (10/26 2332) Resp:  [14-22] 16 (10/26 2332) BP: (95-113)/(49-68) 98/60 (10/27 0600) SpO2:  [91 %-97 %] 97 % (10/26 2332) Last BM Date : 12/17/23  Weight change: Filed Weights   12/15/23 0446 12/16/23 0432 12/17/23 0500  Weight: 122.9 kg 122 kg 121.4 kg    Intake/Output:   Intake/Output Summary (Last 24 hours) at 12/18/2023 0735 Last data filed at 12/17/2023 0847 Gross per 24 hour  Intake 118 ml  Output --  Net 118 ml      Physical Exam    General: NAD Neck: Thick, JVP not possible to see, no thyromegaly or thyroid nodule.  Lungs: Decreased at bases.  CV: Nondisplaced PMI.  Heart regular S1/S2, no S3/S4, no murmur.  1+ edema to knees.  Abdomen: Soft, nontender, no hepatosplenomegaly, no distention.  Skin: Intact without lesions or rashes.  Neurologic: Alert and oriented x 3.  Psych: Normal affect. Extremities: No clubbing or cyanosis.  HEENT: Normal.   Telemetry   Sinus tachycardia rates 100s-110s (personally reviewed)  Labs    CBC Recent Labs     12/17/23 0455 12/18/23 0230  WBC 22.1* 23.7*  HGB 8.1* 7.7*  HCT 27.7* 27.1*  MCV 74.3* 75.9*  PLT 106* 136*   Basic Metabolic Panel Recent Labs    89/73/74 0455 12/18/23 0230  NA 134* 132*  K 3.7 3.7  CL 88* 88*  CO2 32 29  GLUCOSE 132* 181*  BUN 105* 116*  CREATININE 4.32* 5.26*  CALCIUM  9.3 9.1   Liver Function Tests No results for input(s): AST, ALT, ALKPHOS, BILITOT, PROT, ALBUMIN  in the last 72 hours. No results for input(s): LIPASE, AMYLASE in the last 72 hours. Cardiac Enzymes No results for input(s): CKTOTAL, CKMB, CKMBINDEX, TROPONINI in the last 72 hours.  BNP: BNP (last 3 results) Recent Labs    01/13/23 1811 02/15/23 2105 03/27/23 1622  BNP 334.2* 432.5* 462.5*    ProBNP (last 3 results) Recent Labs    11/29/23 1531 11/30/23 0300 12/11/23 1114  PROBNP 1,820.0* 1,624.0* 5,609.0*     D-Dimer No results for input(s): DDIMER in the last 72 hours. Hemoglobin A1C No results for input(s): HGBA1C in the last 72 hours. Fasting Lipid Panel No results for input(s): CHOL, HDL, LDLCALC, TRIG, CHOLHDL, LDLDIRECT in the last 72 hours. Thyroid Function Tests No results for input(s): TSH, T4TOTAL, T3FREE, THYROIDAB in the last 72 hours.  Invalid input(s): FREET3  Other results:   Imaging    DG CHEST PORT 1 VIEW Result Date: 12/17/2023 EXAM: 1 VIEW(S) XRAY OF THE CHEST 12/17/2023 11:46:00 AM COMPARISON: 12/08/2023 CLINICAL HISTORY: Hypoxia FINDINGS: LINES, TUBES AND DEVICES: Right upper extremity PICC in place with tip at superior cavoatrial junction. LUNGS AND PLEURA: Mild pulmonary edema, slightly worsened. Stable retrocardiac opacity favor atelectasis. No pleural effusion. No pneumothorax. HEART AND MEDIASTINUM: Stable cardiomegaly. BONES AND SOFT TISSUES: No acute osseous abnormality. IMPRESSION: 1. Mild CHF, slightly worsened. 2. Stable retrocardiac opacity, favoring atelectasis. Electronically  signed by: Selinda Blue MD 12/17/2023 02:07 PM EDT RP Workstation: [id number]      Medications:     Scheduled Medications:  acidophilus  2 capsule Oral TID   allopurinol   100 mg Oral QHS   alteplase  2 mg Intracatheter Once   arformoterol  15 mcg Nebulization BID   atorvastatin   10 mg Oral QHS   budesonide (PULMICORT) nebulizer solution  0.5 mg Nebulization BID   calcitRIOL  0.25 mcg Oral Q M,W,F   Chlorhexidine  Gluconate Cloth  6 each Topical Daily   diclofenac Sodium  2 g Topical QID   fluticasone  2 spray Each Nare Daily   insulin  aspart  0-20 Units Subcutaneous TID WC   insulin  aspart  0-5 Units Subcutaneous QHS   insulin  glargine-yfgn  25 Units Subcutaneous Daily   ipratropium-albuterol   3 mL Nebulization BID   loratadine   10 mg Oral Daily   midodrine   10 mg Oral TID WC   nystatin  cream  1 Application Topical BID   pantoprazole   40 mg Oral QAC breakfast   polycarbophil  625 mg Oral Daily   polyethylene glycol  17 g Oral Daily   pregabalin   50 mg Oral Daily   sertraline  50 mg Oral q AM   sodium chloride  flush  10-40 mL Intracatheter Q12H   triamcinolone  1 Application Topical Daily    Infusions:  cefTRIAXone  (ROCEPHIN )  IV Stopped (12/17/23 1730)   DOBUTamine     linezolid  (ZYVOX ) IV Stopped (12/17/23 2224)    PRN Medications: acetaminophen  **OR** acetaminophen , albuterol , camphor-menthol , liver oil-zinc  oxide, methocarbamol, naphazoline-pheniramine, nystatin , ondansetron  **OR** ondansetron  (ZOFRAN ) IV, mouth rinse, oxyCODONE , sodium chloride  flush, traZODone     Assessment/Plan   1. 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  Echo this admission with EF 60-65%, mild RV dilation/mild RV dysfunction.  She was started on milrinone for RV support and diuresed extensively.  RHC 10/22 showed that she still had significantly elevated right-sided filling pressures but with PCWP only 12.  Lasix  gtt  at 15 mg/hr started.  This was stopped with rise in creatinine. This morning, co-ox 71% on milrinone 0.25 with creatinine 2.86 => 3.25 => 3.67 =>3.61 =>4.3 => 5.26. She did not get Lasix  yesterday.  CVP up to 16.  BP has been soft and she has been started on midodrine  10 tid.  Concern for sepsis worsening her clinical picture, also noted to be progressively anemic, hgb 13.3 => 7.7 today.  - With low BP, will stop milrinone and start dobutamine 2.5 for RV support for the time being.  - Continue midodrine .  - Workup for sepsis and anemia.  - With worsening volume overload, will give Lasix  160 mg IV x 1.  I worry that she is going to need to need RRT, this would necessitate CVVH and transfer to unit.  - No SGLT2 inhibitor or aldosterone antagonist with AKI.  2.  AKI on CKD stage IV: Worry for cardiorenal syndrome => ATN.  Creatinine up to 5.26 today with minimal UOP.  RV failure initially but now worry about an additional cause for low BP (sepsis, anemia).  - Lasix  challenge as above.  - Transition to dobutamine to promote higher MAP. - Continue midodrine .  - Will consult nephrology, think we are going to end up needing RRT (CVVH).  3. Pulmonary hypertension: RHC showed severe PAH.  She had a V/Q scan earlier this year with no sign of chronic PE.  She is on home oxygen  and Bipap at night.  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  - I suspect that she would not get much benefit from pulmonary vasodilators.  When BP and volume status stabilize, could consider trial of sildenafil 20 tid to see if she has any symptomatic improvement.  - Continue oxygen  during the day, Bipap at night.  - Sent rheumatalogic serologies (?WHO group 1 PH component). Negative ANA. Neg RF, Neg scl 70, neg centroeme ab. 4. ID: Concern for infection, ?UTI, with sepsis syndrome.  WBCs 24 today.  Pending urine and blood cultures. PCT not markedly high at 0.41 (and renal failure).   - She is empirically on linezolid /ceftriaxone .  5. Anemia: Hgb trended down from 13.3 to 7.7.  No overt bleeding.  ?Etiology.  - Hemoccult.  - Check Fe studies.  - PPI - CT chest for RP bleed would be reasonable.   Social/Dispo: Worrying situation with severe RV failure and now AKI/ATN.  She wants us  to do what we can to get her better but understands the challenges.  As above, may require CVVH.  Consulting palliative care service for GOC discussion.    Length of Stay: 71  Ezra Shuck, MD  12/18/2023, 7:35 AM

## 2023-12-18 NOTE — Consult Note (Signed)
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   Medications Discontinued During This Encounter  Medication Reason   midodrine  (PROAMATINE ) 5 MG tablet    furosemide  (LASIX ) injection 40 mg    Chlorhexidine  Gluconate Cloth 2 % PADS 6 each    Oral care mouth rinse    Oral care mouth rinse     furosemide  (LASIX ) injection 40 mg  insulin  glargine (LANTUS ) injection 50 Units    furosemide  (LASIX ) injection 80 mg    pregabalin  (LYRICA ) capsule 75 mg    furosemide  (LASIX ) 120 mg in dextrose  5 % 50 mL IVPB    furosemide  (LASIX ) 120 mg in dextrose  5 % 50 mL IVPB    fluticasone (FLONASE) 50 MCG/ACT nasal spray 1 spray    insulin  glargine (LANTUS ) injection 25 Units    insulin  glargine (LANTUS ) injection 10 Units    midodrine  (PROAMATINE ) tablet 5 mg    pregabalin  (LYRICA ) capsule 75 mg    pregabalin  (LYRICA ) capsule 75 mg    ceFAZolin  (ANCEF ) IVPB 2g/100 mL premix    ceFAZolin  (ANCEF ) IVPB 2g/100 mL premix    dextrose  5 %-0.9 % sodium chloride  infusion    potassium chloride  SA (KLOR-CON  M) CR tablet 20 mEq    methylPREDNISolone  sodium succinate (SOLU-MEDROL ) 125 mg/2 mL injection 60 mg    insulin  glargine-yfgn (SEMGLEE ) injection 10 Units    senna-docusate (Senokot-S) tablet 1 tablet    ipratropium-albuterol  (DUONEB) 0.5-2.5 (3) MG/3ML nebulizer solution 3 mL    insulin  glargine-yfgn (SEMGLEE ) injection 20 Units    rOPINIRole (REQUIP) tablet 0.25 mg    furosemide  (LASIX ) 200 mg in dextrose  5 % 100 mL (2 mg/mL) infusion    midodrine  (PROAMATINE ) tablet 5 mg    insulin  glargine-yfgn (SEMGLEE ) injection 25 Units    metolazone (ZAROXOLYN) tablet 5 mg    furosemide  (LASIX ) injection 80 mg    furosemide  (LASIX ) injection 80 mg    furosemide  (LASIX ) injection 80 mg    lidocaine  (PF) (XYLOCAINE ) 1 % injection Patient Transfer   Heparin  (Porcine) in NaCl 1000-0.9 UT/500ML-% SOLN Patient Transfer   furosemide  (LASIX ) 200 mg in dextrose  5 % 100 mL (2 mg/mL) infusion    potassium chloride  SA (KLOR-CON  M) CR tablet 40 mEq    methylPREDNISolone  sodium succinate (SOLU-MEDROL ) 125 mg/2 mL injection 60 mg    predniSONE (DELTASONE) tablet 20 mg    hydrOXYzine  (ATARAX ) tablet 25 mg    hydrALAZINE  (APRESOLINE ) injection 10 mg    oxyCODONE  (Oxy IR/ROXICODONE ) immediate release tablet 5 mg     heparin  injection 5,000 Units    pregabalin  (LYRICA ) capsule 75 mg    midodrine  (PROAMATINE ) tablet 5 mg    insulin  aspart (novoLOG ) injection 3 Units    insulin  glargine-yfgn (SEMGLEE ) injection 35 Units    oxyCODONE  (Oxy IR/ROXICODONE ) immediate release tablet 10 mg    milrinone (PRIMACOR) 20 MG/100 ML (0.2 mg/mL) infusion    liver oil-zinc  oxide (DESITIN) 40 % ointment     Social History:  reports that she has quit smoking. She has never used smokeless tobacco. She reports current alcohol  use. She reports that she does not use drugs.  Family History:   Family History  Problem Relation Age of Onset   Diabetes Mother     Blood pressure 122/63, pulse (!) 132, temperature 98.2 F (36.8 C), temperature source Oral, resp. rate 19, height (P) 5' 2 (1.575 m), weight 121.4 kg, SpO2 90%. General appearance: alert, cooperative, and morbidly obese Head: Normocephalic, without obvious abnormality, atraumatic Eyes: negative Neck: no adenopathy, no carotid bruit, supple, symmetrical, trachea midline, and thyroid not enlarged, symmetric, no tenderness/mass/nodules Resp: distant BS Cardio: regular rate and rhythm GI: SNDNT +BS Extremities: edema 1+ Pulses: 2+ and symmetric Skin: Skin color, texture, turgor normal. No rashes or lesions       Kavin Weckwerth, LYNWOOD ORN, MD 12/18/2023, 9:57 AM

## 2023-12-18 NOTE — Procedures (Signed)
 Central Venous Catheter Insertion Procedure Note  [name] [name]  [id number]  Jan 19, 1956  Date:12/18/23  Time:5:02 PM   Provider Performing:Lielle Vandervort   Procedure: Insertion of Non-tunneled Central Venous Catheter(36556)with US  guidance (23062)    Indication(s) Hemodialysis  Consent Risks of the procedure as well as the alternatives and risks of each were explained to the patient and/or caregiver.  Consent for the procedure was obtained and is signed in the bedside chart  Anesthesia Topical only with 1% lidocaine    Timeout Verified patient identification, verified procedure, site/side was marked, verified correct patient position, special equipment/implants available, medications/allergies/relevant history reviewed, required imaging and test results available.  Sterile Technique Maximal sterile technique including full sterile barrier drape, hand hygiene, sterile gown, sterile gloves, mask, hair covering, sterile ultrasound probe cover (if used).  Procedure Description Area of catheter insertion was cleaned with chlorhexidine  and draped in sterile fashion.   With real-time ultrasound guidance a HD catheter was placed into the right internal jugular vein.  Nonpulsatile blood flow and easy flushing noted in all ports.  The catheter was sutured in place and sterile dressing applied.  Complications/Tolerance None; patient tolerated the procedure well. Chest X-ray is ordered to verify placement for internal jugular or subclavian cannulation.  Chest x-ray is not ordered for femoral cannulation.  EBL Minimal  Specimen(s) None  POCUS IMAGE OF RIGHT INTERNAL JUGULAR VEIN WITH THE GUIDEWIRE    Tamela Stakes, MD  Attending Physician, Critical Care Medicine [facility] See Amion for pager If no response to pager, please call [phone] until 7pm After 7pm, Please call E-link [phone]

## 2023-12-18 NOTE — Progress Notes (Signed)
 eLink Physician-Brief Progress Note Patient Name: [name] [name] DOB: 1955-08-23 MRN: [id number]   Date of Service  12/18/2023  HPI/Events of Note  getting CRRT running however BP 80/44 (56)   HR 121   on dobutamine 2.5       CVP not working at this time  Camera: HR 115 to 120. MAP soft.  Discussed with RN  eICU Interventions  Start Neo synephrine drip for CRRT.  Has severe PAH.      Intervention Category Intermediate Interventions: Hypotension - evaluation and management  [name] 12/18/2023, 9:38 PM
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
## 2023-12-18 NOTE — IPAL (Signed)
  Interdisciplinary Goals of Care Family Meeting   Date carried out: 12/18/2023  Location of the meeting: Bedside  Member's involved: Physician, Family Member or next of kin, and Palliative care team member  Durable Power of Attorney or acting medical decision maker: son- [name] [name]    Discussion: We discussed goals of care for [name] .  I met with [name] son, daughter-in-law, and significant other at bedside to discuss her ongoing care. We reviewed her heart failure and renal failure worsening despite inotropes and diuresis. We discussed the challenge with chronic advanced cardiac and renal disease with fluid management, and many people are not able to tolerate the fluid shifts of iHD. Her family understands that CRRT is a short-term tool to help improve her, but is not a long-term option. We agreed that we should know within the next week if she is going to tolerate transitioning to CRRT. Her family would also like for her to participate in making her decisions, which today she is too confused to reliably do. She has indicated to palliave care that she does not want to be put on the vent, and her family wants to be able to discuss this with her.   Code status: Full Code; code status not discussed  Disposition: Continue current acute care   Time spent for the meeting: 30 min.  [name] [name] 12/18/2023, 1:37 PM

## 2023-12-18 NOTE — Plan of Care (Signed)
  Problem: Education: Goal: Ability to describe self-care measures that may prevent or decrease complications (Diabetes Survival Skills Education) will improve Outcome: Progressing Goal: Individualized Educational Video(s) Outcome: Progressing   Problem: Coping: Goal: Ability to adjust to condition or change in health will improve Outcome: Progressing   Problem: Fluid Volume: Goal: Ability to maintain a balanced intake and output will improve Outcome: Progressing   Problem: Health Behavior/Discharge Planning: Goal: Ability to identify and utilize available resources and services will improve Outcome: Progressing Goal: Ability to manage health-related needs will improve Outcome: Progressing   Problem: Metabolic: Goal: Ability to maintain appropriate glucose levels will improve Outcome: Progressing   Problem: Nutritional: Goal: Maintenance of adequate nutrition will improve Outcome: Progressing Goal: Progress toward achieving an optimal weight will improve Outcome: Progressing   Problem: Skin Integrity: Goal: Risk for impaired skin integrity will decrease Outcome: Progressing   Problem: Tissue Perfusion: Goal: Adequacy of tissue perfusion will improve Outcome: Progressing   Problem: Education: Goal: Knowledge of General Education information will improve Description: Including pain rating scale, medication(s)/side effects and non-pharmacologic comfort measures Outcome: Progressing   Problem: Health Behavior/Discharge Planning: Goal: Ability to manage health-related needs will improve Outcome: Progressing   Problem: Clinical Measurements: Goal: Ability to maintain clinical measurements within normal limits will improve Outcome: Progressing Goal: Will remain free from infection Outcome: Progressing Goal: Diagnostic test results will improve Outcome: Progressing Goal: Respiratory complications will improve Outcome: Progressing Goal: Cardiovascular complication will  be avoided Outcome: Progressing   Problem: Activity: Goal: Risk for activity intolerance will decrease Outcome: Progressing   Problem: Coping: Goal: Level of anxiety will decrease Outcome: Progressing   Problem: Pain Managment: Goal: General experience of comfort will improve and/or be controlled Outcome: Progressing   Problem: Safety: Goal: Ability to remain free from injury will improve Outcome: Progressing   Problem: Skin Integrity: Goal: Risk for impaired skin integrity will decrease Outcome: Progressing   Problem: Clinical Measurements: Goal: Ability to avoid or minimize complications of infection will improve Outcome: Progressing   Problem: Skin Integrity: Goal: Skin integrity will improve Outcome: Progressing

## 2023-12-18 NOTE — Progress Notes (Signed)
 Attempted to call s/o Verdie Sprang-- unable to leave a message.  Called son Francis Leaven- left a message to call back.  Palliative consulted and at bedside during my exam. Patient lacks capacity at present to make decisions. Needs ongoing GOC discussions due to very poor prognosis of chronic right heart failure and advanced renal disease. Palliative has also been working on getting in touch with family.   Leita SHAUNNA Gaskins, DO 12/18/23 10:32 AM [facility]  For contact information, see Amion. If no response to pager, please call PCCM consult pager. After hours, 7PM- 7AM, please call Elink.

## 2023-12-18 NOTE — Consult Note (Addendum)
 WOC Nurse Consult Note: Reason for Consult: Consult requested for bilat buttocks, posterior upper thighs and sacrum.  Pt is frequently incontinent of loose stools. This is causing moisture associated skin damage with patchy areas of red, moist macerated full thickness skin loss with intact skin in between.  All areas listed above are affected. These are NOT pressure injuries. Pt has a high BMI and limited mobility and other systemic factors which can impair healing.  Stools are not liquid enough to allow for a Flexiseal to be inserted. Topical treatment orders provided for bedside nurses to perform as follows: Leave foam dressings off, since they are trapping stool underneath the dressings. Apply Gerhardts cream to bilat buttocks BID and PRN when turning or cleaning.  Please re-consult if further assistance is needed.  Thank-you,  Stephane Fought MSN, RN, CWOCN, CWCN-AP, CNS Contact Mon-Fri 0700-1500: [phone]

## 2023-12-18 NOTE — Progress Notes (Signed)
 PROGRESS NOTE    [name] [name]  FMW:[id number] DOB: 10/01/1955 DOA: 11/29/2023 PCP: [facility]   68/F morbidly obese with OSA OHS, diastolic CHF, pulmonary hypertension, RV failure, CKD 4, bedbound x3 years >admitted to [facility] with CHF exacerbation, complicated by low output started on milrinone and Lasix  gtt. - Volume status improving,  - Complicated by worsening AKI - Multifactorial leukocytosis as well - 10/27, continued worsening AKI, some symptoms of uremia, volume overloaded, transferring to ICU, milrinone changed to Dobutamine  Subjective: - Having jerky movements in her arms  Assessment and Plan:  Acute on chronic diastolic CHF, RV failure Severe pulmonary hypertension -Echo this admission with EF 60-65, mildly reduced RV, severely elevated PASP -Heart failure team following, diuresed with Lasix  gtt. and milrinone for RV support -RHC 10/22 noted significantly elevated RV filling pressures, wedge only 12 -Unfortunately with worsening AKI, volume overload -has been on milrinone with AKI/CKD, GDMT limited>now changed to Dobutamine, may need CRRT if aggressive care  -discussed poor prognosis with patient and significant other, that she would be a very poor long term HD candidate, palliative consulted   AKI on CKD stage 4 Baseline creatinine around 2.1. -Creatinine needs to worsen, likely combination of cardiorenal, soft BPs also contributing -Increase midodrine  dose - now w/ asterixes, see discussion above  Acute hypoxic and hypercarbic respiratory failure OSA OHS, morbid obesity with BMI of 49 -On 4 to 5 L O2 at baseline Initial ABG showed pCO2 of 73.  Improving with BiPAP nightly. -Clinically do not suspect COPD exacerbation , stopped IV Solu-Medrol , Rx w prednisone for 2 days and then discontinued - Continue Pulmicort Brovana DuoNeb, Claritin   - Emphasized importance of BiPAP nightly   Leukocytosis/Sepsis -Afebrile and nontoxic, prednisone could be  contributing as well, now discontinued - Foley catheter removed 10/25, follow-up urine cultures - Sacral wounds are very superficial, do not suspect infection here -Has a PICC line, blood cx negative so far, d/w PCCM to remove PICC today and place HD catheter until decision clarified re: CRRT - Procalcitonin mildly elevated, was ok yesterday, Day 2 of ceftriaxone  and Zyvox    Hypokalemia.   Replaced.   Diarrhea.  Intermittent. Uncertain etiology.  Add fiber, discontinue rectal tube GI pathogen panel negative.   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 -Continue Lantus , DC meal coverage  Diabetic neuropathy. On Lyrica .  Dose adjusted for renal dysfunction and mentation.   Left breast cellulitis versus breast swelling secondary to dependent edema Treated with linezolid , completed 7-day treatment course.  Improved General Surgery was consulted, recommended nursing place several folded towels under left breast to keep it elevated.   Toxic metabolic encephalopathy secondary to hypoxia, hypercarbia as well as Lyrica . Mentation improving after adjusting Lyrica  dose and BiPAP therapy. Improved, communicative and oriented.   Chronic hypotension Currently on midodrine , milrinone and Lasix .    Hyperlipidemia Continue Lipitor   Obesity Class 3 Body mass index is 49.52 kg/m (pended).  Placing the pt at higher risk of poor outcomes.   Bedbound  due to previous history of lower extremity fracture.   Anxiety/depression Continue Zoloft.     Anisocoria. Left pupil more than right. CT scan of the head negative for any acute abnormality.  No other focal deficit.  Monitor.    DVT prophylaxis: Heparin  subcutaneous Code Status: DNR, after much discussions with patient myself and palliative care NP she is agreeable to DNR Family Communication: None present, called and updated significant other Mr. Delores Disposition Plan:  To be  determined  Consultants:    Procedures:   Antimicrobials:    Objective: Vitals:   12/18/23 0600 12/18/23 0740 12/18/23 0757 12/18/23 0951  BP: 98/60 117/86  122/63  Pulse:  (!) 116 (!) 116 (!) 132  Resp:  19    Temp: 98.2 F (36.8 C)     TempSrc: Oral     SpO2:  93% 92%   Weight:      Height:       No intake or output data in the 24 hours ending 12/18/23 0953  Filed Weights   12/15/23 0446 12/16/23 0432 12/17/23 0500  Weight: 122.9 kg 122 kg 121.4 kg    Examination:  General exam: Morbidly obese chronically ill, AAO x 2, mild cognitive deficits, HEENT: Neck obese unable to assess JVD Respiratory system: Distant breath sounds otherwise clear Cardiovascular system: S1 & S2 heard, RRR.  Abd: nondistended, soft and nontender.Normal bowel sounds heard. Neuro: Positive asterixis Extremities: Trace edema Skin: No rashes Psychiatry:  Mood & affect appropriate.     Data Reviewed:   CBC: Recent Labs  Lab 12/13/23 1031 12/13/23 1522 12/13/23 1523 12/16/23 0450 12/17/23 0455 12/18/23 0230  WBC 14.9*  --   --  17.9* 22.1* 23.7*  NEUTROABS 13.5*  --   --   --   --   --   HGB 10.2* 13.3 13.3 9.3* 8.1* 7.7*  HCT 34.8* 39.0 39.0 31.5* 27.7* 27.1*  MCV 75.0*  --   --  73.6* 74.3* 75.9*  PLT 132*  --   --  89* 106* 136*   Basic Metabolic Panel: Recent Labs  Lab 12/14/23 0638 12/15/23 0500 12/16/23 0450 12/17/23 0455 12/18/23 0230  NA 139 132* 131* 134* 132*  K 4.1 4.3 3.9 3.7 3.7  CL 91* 86* 85* 88* 88*  CO2 33* 32 31 32 29  GLUCOSE 166* 273* 191* 132* 181*  BUN 90* 96* 96* 105* 116*  CREATININE 3.25* 3.67* 3.61* 4.32* 5.26*  CALCIUM  9.5 9.4 9.3 9.3 9.1  MG  --   --   --   --  1.5*   GFR: Estimated Creatinine Clearance: 12.5 mL/min (A) (by C-G formula based on SCr of 5.26 mg/dL (H)). Liver Function Tests: No results for input(s): AST, ALT, ALKPHOS, BILITOT, PROT, ALBUMIN  in the last 168 hours.  No results for input(s): LIPASE, AMYLASE  in the last 168 hours. No results for input(s): AMMONIA in the last 168 hours. Coagulation Profile: Recent Labs  Lab 12/13/23 1031  INR 1.1   Cardiac Enzymes: No results for input(s): CKTOTAL, CKMB, CKMBINDEX, TROPONINI in the last 168 hours. BNP (last 3 results) Recent Labs    11/29/23 1531 11/30/23 0300 12/11/23 1114  PROBNP 1,820.0* 1,624.0* 5,609.0*   HbA1C: No results for input(s): HGBA1C in the last 72 hours. CBG: Recent Labs  Lab 12/17/23 1119 12/17/23 1544 12/17/23 2120 12/18/23 0638 12/18/23 0804  GLUCAP 144* 228* 183* 183* 186*   Lipid Profile: No results for input(s): CHOL, HDL, LDLCALC, TRIG, CHOLHDL, LDLDIRECT in the last 72 hours. Thyroid Function Tests: No results for input(s): TSH, T4TOTAL, FREET4, T3FREE, THYROIDAB in the last 72 hours. Anemia Panel: No results for input(s): VITAMINB12, FOLATE, FERRITIN, TIBC, IRON, RETICCTPCT in the last 72 hours.  Urine analysis:    Component Value Date/Time   COLORURINE YELLOW 12/16/2023 0843   APPEARANCEUR HAZY (A) 12/16/2023 0843   LABSPEC 1.011 12/16/2023 0843   PHURINE 7.0 12/16/2023 0843   GLUCOSEU NEGATIVE 12/16/2023 9156  HGBUR LARGE (A) 12/16/2023 0843   BILIRUBINUR NEGATIVE 12/16/2023 0843   KETONESUR NEGATIVE 12/16/2023 0843   PROTEINUR 100 (A) 12/16/2023 0843   UROBILINOGEN 0.2 06/28/2014 1500   NITRITE NEGATIVE 12/16/2023 0843   LEUKOCYTESUR LARGE (A) 12/16/2023 0843   Sepsis Labs: @LABRCNTIP (procalcitonin:4,lacticidven:4)  ) Recent Results (from the past 240 hours)  Culture, blood (Routine X 2) w Reflex to ID Panel     Status: None (Preliminary result)   Collection Time: 12/17/23  9:05 AM   Specimen: BLOOD  Result Value Ref Range Status   Specimen Description BLOOD PICC LINE  Final   Special Requests   Final    BOTTLES DRAWN AEROBIC AND ANAEROBIC Blood Culture results may not be optimal due to an inadequate volume of blood received in  culture bottles   Culture   Final    NO GROWTH < 24 HOURS Performed at [facility], [address], [place], [place] [address]    Report Status PENDING  Incomplete  Culture, blood (Routine X 2) w Reflex to ID Panel     Status: None (Preliminary result)   Collection Time: 12/17/23 10:23 AM   Specimen: BLOOD LEFT ARM  Result Value Ref Range Status   Specimen Description BLOOD LEFT ARM  Final   Special Requests   Final    BOTTLES DRAWN AEROBIC AND ANAEROBIC Blood Culture results may not be optimal due to an inadequate volume of blood received in culture bottles   Culture   Final    NO GROWTH < 24 HOURS Performed at [facility], [address], [place], [place] [address]    Report Status PENDING  Incomplete     Radiology Studies: DG CHEST PORT 1 VIEW Result Date: 12/17/2023 EXAM: 1 VIEW(S) XRAY OF THE CHEST 12/17/2023 11:46:00 AM COMPARISON: 12/08/2023 CLINICAL HISTORY: Hypoxia FINDINGS: LINES, TUBES AND DEVICES: Right upper extremity PICC in place with tip at superior cavoatrial junction. LUNGS AND PLEURA: Mild pulmonary edema, slightly worsened. Stable retrocardiac opacity favor atelectasis. No pleural effusion. No pneumothorax. HEART AND MEDIASTINUM: Stable cardiomegaly. BONES AND SOFT TISSUES: No acute osseous abnormality. IMPRESSION: 1. Mild CHF, slightly worsened. 2. Stable retrocardiac opacity, favoring atelectasis. Electronically signed by: Selinda Blue MD 12/17/2023 02:07 PM EDT RP Workstation: [id number]      Scheduled Meds:  acidophilus  2 capsule Oral TID   allopurinol   100 mg Oral QHS   alteplase  2 mg Intracatheter Once   arformoterol  15 mcg Nebulization BID   atorvastatin   10 mg Oral QHS   budesonide (PULMICORT) nebulizer solution  0.5 mg Nebulization BID   calcitRIOL  0.25 mcg Oral Q M,W,F   Chlorhexidine  Gluconate Cloth  6 each Topical Daily   diclofenac Sodium  2 g Topical QID   fluticasone  2 spray Each Nare Daily   Gerhardt's butt cream   Topical  BID   insulin  aspart  0-20 Units Subcutaneous TID WC   insulin  aspart  0-5 Units Subcutaneous QHS   insulin  glargine-yfgn  25 Units Subcutaneous Daily   ipratropium-albuterol   3 mL Nebulization BID   loratadine   10 mg Oral Daily   midodrine   10 mg Oral TID WC   nystatin  cream  1 Application Topical BID   pantoprazole   40 mg Oral QAC breakfast   polycarbophil  625 mg Oral Daily   polyethylene glycol  17 g Oral Daily   pregabalin   50 mg Oral Daily   sertraline  50 mg Oral q AM  sodium chloride  flush  10-40 mL Intracatheter Q12H   triamcinolone  1 Application Topical Daily   Continuous Infusions:  cefTRIAXone  (ROCEPHIN )  IV Stopped (12/17/23 1730)   DOBUTamine 2.5 mcg/kg/min (12/18/23 0753)   furosemide  160 mg (12/18/23 0906)   linezolid  (ZYVOX ) IV Stopped (12/17/23 2224)     LOS: 19 days    Time spent:    Sigurd Pac, MD [facility]   12/18/2023, 9:53 AM

## 2023-12-19 DIAGNOSIS — D696 Thrombocytopenia, unspecified: Secondary | ICD-10-CM | POA: Diagnosis not present

## 2023-12-19 DIAGNOSIS — I5023 Acute on chronic systolic (congestive) heart failure: Principal | ICD-10-CM

## 2023-12-19 DIAGNOSIS — I5033 Acute on chronic diastolic (congestive) heart failure: Secondary | ICD-10-CM | POA: Diagnosis not present

## 2023-12-19 DIAGNOSIS — J9601 Acute respiratory failure with hypoxia: Secondary | ICD-10-CM | POA: Diagnosis not present

## 2023-12-19 DIAGNOSIS — N1832 Chronic kidney disease, stage 3b: Secondary | ICD-10-CM | POA: Diagnosis not present

## 2023-12-19 DIAGNOSIS — J9620 Acute and chronic respiratory failure, unspecified whether with hypoxia or hypercapnia: Secondary | ICD-10-CM | POA: Diagnosis not present

## 2023-12-19 LAB — GLUCOSE, CAPILLARY
Glucose-Capillary: 134 mg/dL — ABNORMAL HIGH (ref 70–99)
Glucose-Capillary: 176 mg/dL — ABNORMAL HIGH (ref 70–99)
Glucose-Capillary: 159 mg/dL — ABNORMAL HIGH (ref 70–99)
Glucose-Capillary: 106 mg/dL — ABNORMAL HIGH (ref 70–99)

## 2023-12-19 LAB — CBC
HCT: 28.9 % — ABNORMAL LOW (ref 36.0–46.0)
Hemoglobin: 8.4 g/dL — ABNORMAL LOW (ref 12.0–15.0)
MCH: 21.7 pg — ABNORMAL LOW (ref 26.0–34.0)
MCHC: 29.1 g/dL — ABNORMAL LOW (ref 30.0–36.0)
MCV: 74.7 fL — ABNORMAL LOW (ref 80.0–100.0)
Platelets: 155 K/uL (ref 150–400)
RBC: 3.87 MIL/uL (ref 3.87–5.11)
RDW: 20.8 % — ABNORMAL HIGH (ref 11.5–15.5)
WBC: 27.7 K/uL — ABNORMAL HIGH (ref 4.0–10.5)
nRBC: 0.4 % — ABNORMAL HIGH (ref 0.0–0.2)

## 2023-12-19 LAB — RENAL FUNCTION PANEL
Albumin: 2.9 g/dL — ABNORMAL LOW (ref 3.5–5.0)
Albumin: 2.6 g/dL — ABNORMAL LOW (ref 3.5–5.0)
Anion gap: 16 — ABNORMAL HIGH (ref 5–15)
Anion gap: 14 (ref 5–15)
BUN: 74 mg/dL — ABNORMAL HIGH (ref 8–23)
BUN: 45 mg/dL — ABNORMAL HIGH (ref 8–23)
CO2: 25 mmol/L (ref 22–32)
CO2: 27 mmol/L (ref 22–32)
Calcium: 9 mg/dL (ref 8.9–10.3)
Calcium: 8.6 mg/dL — ABNORMAL LOW (ref 8.9–10.3)
Chloride: 92 mmol/L — ABNORMAL LOW (ref 98–111)
Chloride: 97 mmol/L — ABNORMAL LOW (ref 98–111)
Creatinine, Ser: 3.59 mg/dL — ABNORMAL HIGH (ref 0.44–1.00)
Creatinine, Ser: 2.42 mg/dL — ABNORMAL HIGH (ref 0.44–1.00)
GFR, Estimated: 13 mL/min — ABNORMAL LOW (ref >60)
GFR, Estimated: 21 mL/min — ABNORMAL LOW (ref >60)
Glucose, Bld: 147 mg/dL — ABNORMAL HIGH (ref 70–99)
Glucose, Bld: 159 mg/dL — ABNORMAL HIGH (ref 70–99)
Phosphorus: 2.8 mg/dL (ref 2.5–4.6)
Phosphorus: 1.7 mg/dL — ABNORMAL LOW (ref 2.5–4.6)
Potassium: 3.4 mmol/L — ABNORMAL LOW (ref 3.5–5.1)
Potassium: 4 mmol/L (ref 3.5–5.1)
Sodium: 133 mmol/L — ABNORMAL LOW (ref 135–145)
Sodium: 138 mmol/L (ref 135–145)

## 2023-12-19 LAB — COOXEMETRY PANEL
Carboxyhemoglobin: 2.8 % — ABNORMAL HIGH (ref 0.5–1.5)
Methemoglobin: <0.7 % (ref 0.0–1.5)
O2 Saturation: 64.8 %
Total hemoglobin: 8.7 g/dL — ABNORMAL LOW (ref 12.0–16.0)

## 2023-12-19 LAB — MAGNESIUM: Magnesium: 1.8 mg/dL (ref 1.7–2.4)

## 2023-12-19 MED ORDER — POTASSIUM CHLORIDE 10 MEQ/50ML IV SOLN
10.0000 meq | INTRAVENOUS | Status: AC
  Administered 2023-12-19 (×3): 10 meq via INTRAVENOUS
  Filled 2023-12-19 (×3): qty 50

## 2023-12-19 MED ORDER — HEPARIN SODIUM (PORCINE) 5000 UNIT/ML IJ SOLN
5000.0000 [IU] | Freq: Three times a day (TID) | INTRAMUSCULAR | Status: DC
  Administered 2023-12-19 – 2023-12-28 (×27): 5000 [IU] via SUBCUTANEOUS
  Filled 2023-12-19 (×26): qty 1

## 2023-12-19 MED ORDER — POTASSIUM CHLORIDE CRYS ER 20 MEQ PO TBCR
20.0000 meq | EXTENDED_RELEASE_TABLET | Freq: Two times a day (BID) | ORAL | Status: AC
  Administered 2023-12-19 (×2): 20 meq via ORAL
  Filled 2023-12-19 (×2): qty 1

## 2023-12-19 MED ORDER — ENSURE PLUS HIGH PROTEIN PO LIQD
237.0000 mL | Freq: Every day | ORAL | Status: DC
  Administered 2023-12-19 – 2023-12-26 (×2): 237 mL via ORAL

## 2023-12-19 MED ORDER — RENA-VITE PO TABS
1.0000 | ORAL_TABLET | Freq: Every day | ORAL | Status: DC
  Administered 2023-12-19 – 2023-12-27 (×8): 1 via ORAL
  Filled 2023-12-19 (×8): qty 1

## 2023-12-19 MED ORDER — SODIUM CHLORIDE 0.9 % IV SOLN
500.0000 [IU]/h | INTRAVENOUS | Status: DC
  Administered 2023-12-19: 500 [IU]/h via INTRAVENOUS_CENTRAL
  Administered 2023-12-20: 750 [IU]/h via INTRAVENOUS_CENTRAL
  Administered 2023-12-20: 500 [IU]/h via INTRAVENOUS_CENTRAL
  Administered 2023-12-21 – 2023-12-26 (×11): 1000 [IU]/h via INTRAVENOUS_CENTRAL
  Filled 2023-12-19: qty 2
  Filled 2023-12-19 (×4): qty 10000
  Filled 2023-12-19 (×4): qty 2
  Filled 2023-12-19 (×4): qty 10000
  Filled 2023-12-19: qty 2
  Filled 2023-12-19: qty 10000
  Filled 2023-12-19: qty 2

## 2023-12-19 MED ORDER — PROSOURCE PLUS PO LIQD
30.0000 mL | Freq: Two times a day (BID) | ORAL | Status: DC
  Administered 2023-12-19 – 2023-12-27 (×8): 30 mL via ORAL
  Filled 2023-12-19 (×12): qty 30

## 2023-12-19 MED ORDER — MAGNESIUM SULFATE 2 GM/50ML IV SOLN
2.0000 g | Freq: Once | INTRAVENOUS | Status: AC
  Administered 2023-12-19: 2 g via INTRAVENOUS
  Filled 2023-12-19: qty 50

## 2023-12-19 NOTE — Plan of Care (Signed)
  Problem: Fluid Volume: Goal: Ability to maintain a balanced intake and output will improve Outcome: Progressing   Problem: Health Behavior/Discharge Planning: Goal: Ability to identify and utilize available resources and services will improve Outcome: Progressing Goal: Ability to manage health-related needs will improve Outcome: Progressing   Problem: Metabolic: Goal: Ability to maintain appropriate glucose levels will improve Outcome: Progressing

## 2023-12-19 NOTE — Progress Notes (Signed)
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 [facility];  Service: Endoscopy;;   IR ANGIOGRAM SELECTIVE EACH ADDITIONAL VESSEL  11/16/2019   IR ANGIOGRAM VISCERAL SELECTIVE  11/16/2019   IR EMBO ART  VEN HEMORR LYMPH EXTRAV  INC GUIDE ROADMAPPING  11/16/2019   IR FLUORO GUIDE CV LINE RIGHT  11/06/2019   IR REMOVAL TUN CV CATH W/O FL  11/21/2019   IR US  GUIDE VASC ACCESS RIGHT  11/06/2019   IR US  GUIDE VASC ACCESS RIGHT  11/16/2019   RIGHT HEART CATH N/A 12/13/2023   Procedure: RIGHT HEART CATH;  Surgeon: Rolan Ezra RAMAN, MD;  Location: [facility] INVASIVE CV LAB;  Service: Cardiovascular;  Laterality: N/A;   SCLEROTHERAPY  11/15/2019   Procedure:  MATIAS;  Surgeon: Rollin Dover, MD;  Location: [facility];  Service: Endoscopy;;    Allergies:  Allergies  Allergen Reactions   Lisinopril Swelling and Other (See Comments)    I cannot take this.   Morphine  Hives, Itching and Rash   Penicillins Hives, Itching, Nausea And Vomiting and Rash   Latex Other (See Comments)     I do not like latex.   Metformin Diarrhea and Other (See Comments)    Allergic, per caretaker   Sulfa Antibiotics Other (See Comments)    Hypoglycemia, per the [facility]    Medications:   Prior to Admission medications   Medication Sig Start Date End Date Taking? Authorizing Provider  acetaminophen  (TYLENOL ) 500 MG tablet Take 1,500 mg by mouth in the morning and at bedtime.   Yes [provider]  albuterol  (PROVENTIL ) (2.5 MG/3ML) 0.083% nebulizer solution Take 2.5 mg by nebulization in the morning and at bedtime.   Yes [provider]  albuterol  (VENTOLIN  HFA) 108 (90 Base) MCG/ACT inhaler Inhale 2 puffs into the lungs every 6 (six) hours as needed for wheezing or shortness of breath.   Yes [provider]  allopurinol  (ZYLOPRIM ) 100 MG tablet Take 100 mg by mouth at bedtime.   Yes [provider]  atorvastatin  (LIPITOR) 10 MG tablet Take 10 mg by mouth at bedtime.   Yes [provider]  calcitRIOL (ROCALTROL) 0.25 MCG capsule Take 0.25 mcg by mouth every Monday, Wednesday, and Friday.   Yes [provider]  cetirizine (ZYRTEC) 10 MG tablet Take 10 mg by mouth in the morning.   Yes [provider]  Continuous Glucose Sensor (FREESTYLE LIBRE 3 PLUS SENSOR) MISC Inject 1 Device into the skin See admin instructions. Place 1 new sensor into the skin every 15 days   Yes [provider]  desonide (DESOWEN) 0.05 % cream Apply 1 Application topically 2 (two) times daily as needed (for irritation- under the breasts/up to 2 weeks a month).   Yes [provider]  Dimethicone (JOHNSONS  BABY CREAM EX) Apply 1 application  topically See admin instructions. Apply to the buttocks 3 times a day   Yes [provider]  fluticasone (FLONASE) 50 MCG/ACT nasal spray Place 1 spray into both nostrils 2 (two) times daily as needed for allergies or rhinitis.   Yes [provider]  glucose 4 GM chewable tablet Chew 4 tablets by mouth as needed for low blood sugar (less than 70 and repeat every 15 minutes as per directed).   Yes [provider]  hydrOXYzine  (ATARAX ) 25 MG tablet Take 25 mg by mouth 4 (four) times daily as needed for itching or anxiety.   Yes [provider]  Insulin  Glargine Solostar (LANTUS ) 100 UNIT/ML Solostar Pen Inject 50 Units into the skin in the morning.   Yes [provider]  ketoconazole (NIZORAL) 2 % cream Apply 1 Application topically daily as needed for irritation.   Yes [provider]  Magnesium  Oxide 420 MG TABS Take 420 mg by mouth 3 (three) times daily.   Yes [provider]  melatonin 3 MG TABS tablet Take 6-9 mg by mouth at bedtime. 11/22/22  Yes [provider]  methocarbamol (ROBAXIN) 500 MG tablet Take 500 mg by mouth at bedtime as needed (for muscle pain).   Yes [provider]  mirtazapine  (REMERON ) 7.5 MG tablet Take 7.5 mg by mouth at bedtime.   Yes [provider]  naloxone  (NARCAN ) nasal spray 4 mg/0.1 mL Place 1 spray into the nose See admin instructions. Instill 1 spray into one nostril as directed  for an accidental opioid overdose. Call 9-1-1, then repeat directions after turning the person on their left side.   Yes [provider]  nystatin  cream (MYCOSTATIN ) Apply 1 Application topically See admin instructions. Apply under the breasts, between the abdominal folds, and where both upper legs join the lower torso 2 times a day   Yes [provider]  nystatin  powder Apply 1 Application topically 3 (three) times daily as needed (under the breasts- for  yeast).   Yes [provider]  ondansetron  (ZOFRAN ) 8 MG tablet Take 4 mg by mouth every 8 (eight) hours as needed for nausea or vomiting.   Yes [provider]  oxyCODONE  (OXY IR/ROXICODONE ) 5 MG immediate release tablet Take 5 mg by mouth 2 (two) times daily as needed (for unresolved pain).   Yes [provider]  Oxycodone  HCl 10 MG TABS Take 10 mg by mouth in the morning and at bedtime.   Yes [provider]  OXYGEN  Inhale 4 L/min into the lungs continuous.   Yes [provider]  pantoprazole  (PROTONIX ) 40 MG tablet Take 40 mg by mouth daily before breakfast.   Yes [provider]  PETROLATUM -ZINC  OXIDE EX Apply 1 application  topically 3 (three) times daily as needed (for irritation- affected areas).   Yes [provider]  polyethylene glycol powder (GLYCOLAX /MIRALAX ) 17 GM/SCOOP powder Take 17 g by mouth daily as needed for mild constipation. Dissolve 1 capful (17g) in 4-8 ounces of liquid and take by mouth daily.   Yes [provider]  pramoxine (SARNA SENSITIVE) 1 % LOTN Apply 1 Application topically See admin instructions. Apply to affected areas 2 times a day   Yes [provider]  pregabalin  (LYRICA ) 75 MG capsule Take 75 mg by mouth in the morning and at bedtime.   Yes [provider]  PRESCRIPTION MEDICATION BiPAP- At bedtime   Yes [provider]  selenium sulfide (SELSUN) 2.5 % lotion Apply 1 Application topically See admin instructions. Use as directed as a body wash   Yes [provider]  Semaglutide, 2 MG/DOSE, 8 MG/3ML SOPN Inject 2 mg into the skin every Thursday. 10/20/22  Yes [provider]  sertraline (ZOLOFT) 50 MG tablet Take 50 mg by mouth in the morning.   Yes [provider]  torsemide  (DEMADEX ) 20 MG tablet Take 2 tablets (40 mg total) by mouth 2 (two) times daily. Patient taking differently: Take 40 mg by mouth in the morning. 02/23/23  Yes Fairy Frames, MD  triamcinolone (KENALOG) 0.025 % cream Apply 1 Application topically See admin instructions. Apply a thin layer to affected areas of the face once a day   Yes [provider]  potassium chloride  SA (KLOR-CON  M) 20 MEQ tablet Take 1 tablet (20 mEq total) by mouth daily. Patient not taking: Reported on 11/30/2023 02/23/23   Fairy Frames, MD    Discontinued Meds:   Medications Discontinued During This Encounter  Medication Reason   midodrine  (PROAMATINE ) 5 MG tablet    furosemide  (LASIX ) injection 40 mg    Chlorhexidine  Gluconate Cloth 2 % PADS 6 each    Oral care mouth rinse    Oral care mouth rinse    furosemide  (LASIX ) injection 40 mg    insulin  glargine (LANTUS ) injection 50 Units    furosemide  (LASIX ) injection 80 mg    pregabalin  (LYRICA ) capsule 75 mg    furosemide  (LASIX ) 120 mg in dextrose  5 % 50 mL IVPB    furosemide  (  LASIX ) 120 mg in dextrose  5 % 50 mL IVPB    fluticasone (FLONASE) 50 MCG/ACT nasal spray 1 spray    insulin  glargine (LANTUS ) injection 25 Units    insulin  glargine (LANTUS ) injection 10 Units    midodrine  (PROAMATINE ) tablet 5 mg    pregabalin  (LYRICA ) capsule 75 mg    pregabalin  (LYRICA ) capsule 75 mg    ceFAZolin  (ANCEF ) IVPB 2g/100 mL premix    ceFAZolin  (ANCEF ) IVPB 2g/100 mL premix    dextrose  5 %-0.9 % sodium chloride  infusion    potassium chloride  SA (KLOR-CON  M) CR tablet 20 mEq    methylPREDNISolone  sodium succinate (SOLU-MEDROL ) 125 mg/2 mL injection 60 mg    insulin  glargine-yfgn (SEMGLEE ) injection 10 Units    senna-docusate (Senokot-S) tablet 1 tablet    ipratropium-albuterol  (DUONEB) 0.5-2.5 (3) MG/3ML nebulizer solution 3 mL    insulin  glargine-yfgn (SEMGLEE ) injection 20 Units    rOPINIRole (REQUIP) tablet 0.25 mg    furosemide  (LASIX ) 200 mg in dextrose  5 % 100 mL (2 mg/mL) infusion    midodrine  (PROAMATINE ) tablet 5 mg    insulin  glargine-yfgn (SEMGLEE ) injection 25 Units    metolazone (ZAROXOLYN) tablet 5 mg     furosemide  (LASIX ) injection 80 mg    furosemide  (LASIX ) injection 80 mg    furosemide  (LASIX ) injection 80 mg    lidocaine  (PF) (XYLOCAINE ) 1 % injection Patient Transfer   Heparin  (Porcine) in NaCl 1000-0.9 UT/500ML-% SOLN Patient Transfer   furosemide  (LASIX ) 200 mg in dextrose  5 % 100 mL (2 mg/mL) infusion    potassium chloride  SA (KLOR-CON  M) CR tablet 40 mEq    methylPREDNISolone  sodium succinate (SOLU-MEDROL ) 125 mg/2 mL injection 60 mg    predniSONE (DELTASONE) tablet 20 mg    hydrOXYzine  (ATARAX ) tablet 25 mg    hydrALAZINE  (APRESOLINE ) injection 10 mg    oxyCODONE  (Oxy IR/ROXICODONE ) immediate release tablet 5 mg    heparin  injection 5,000 Units    pregabalin  (LYRICA ) capsule 75 mg    midodrine  (PROAMATINE ) tablet 5 mg    insulin  aspart (novoLOG ) injection 3 Units    insulin  glargine-yfgn (SEMGLEE ) injection 35 Units    oxyCODONE  (Oxy IR/ROXICODONE ) immediate release tablet 10 mg    milrinone (PRIMACOR) 20 MG/100 ML (0.2 mg/mL) infusion    liver oil-zinc  oxide (DESITIN) 40 % ointment    phenylephrine  (NEO-SYNEPHRINE) 20mg /NS premix infusion     Social History:  reports that she has quit smoking. She has never used smokeless tobacco. She reports current alcohol  use. She reports that she does not use drugs.  Family History:   Family History  Problem Relation Age of Onset   Diabetes Mother     Blood pressure 116/89, pulse (!) 111, temperature 97.8 F (36.6 C), temperature source Oral, resp. rate 17, height (P) 5' 2 (1.575 m), weight 116.6 kg, SpO2 97%. Physical Exam: General appearance: alert, cooperative, and morbidly obese Head: NCAT Eyes: negative Neck: no adenopathy, no carotid bruit Resp: distant BS Cardio: regular rate and rhythm GI: SNDNT +BS Extremities: edema 1+ Pulses: 2+ and symmetric         Janyra Barillas, LYNWOOD ORN, MD 12/19/2023, 8:37 AM

## 2023-12-19 NOTE — Progress Notes (Signed)
 Daily Progress Note   Patient Name: [name] [name]       Date: 12/19/2023 DOB: 1955/12/26  Age: 68 y.o. MRN#: [id number] Attending Physician: Gretta Leita SQUIBB, DO Primary Care Physician: [facility] Admit Date: 11/29/2023  Reason for Consultation/Follow-up: Establishing goals of care  Subjective: Medical records reviewed including progress notes, labs and imaging. Patient assessed at the bedside.  She recalls meeting me yesterday.  Her son and daughter-in-law are present visiting.  Discussed with RN.  Reviewed yesterday's conversations with patient and family.  Spent extensive amount of time discussing overall prognosis and rationale for patient not being a long-term HD candidate.  Discussed challenges associated with dobutamine as well.  Emphasized importance of considering patient's wishes and attempted to elicit her values and priorities.  She was agreeable to everything I suggested in several different ways, saying yes to both full code and DNR/DNI.  She is too confused to it reliably participate.  I informed patient's son that he would be able to make a final determination, encouraging to continue discussions with his mother as much as possible. Recommended consideration of DNR status, understanding evidenced-based poor outcomes in similar hospitalized patients, as the cause of the arrest is likely associated with chronic/terminal disease rather than a reversible acute cardio-pulmonary event.  Aggressive cardiopulmonary resuscitation would likely cause more pain and suffering than benefit.  They are not ready to make a decision today and would appreciate another visit tomorrow.  Questions and concerns addressed. PMT will continue to support holistically.   Length of Stay: 20   Physical Exam Vitals and nursing note reviewed.   Constitutional:      General: She is not in acute distress.    Appearance: She is ill-appearing.  HENT:     Head: Normocephalic and atraumatic.  Cardiovascular:     Rate and Rhythm: Tachycardia present.  Pulmonary:     Effort: Pulmonary effort is normal.  Neurological:     Mental Status: She is confused.  Psychiatric:        Behavior: Behavior normal.        Cognition and Memory: Cognition is impaired.             Vital Signs: BP 116/89   Pulse (!) 111   Temp 97.8 F (36.6 C) (Oral)   Resp 17   Ht (P) 5' 2 (1.575 m)   Wt 116.6 kg   SpO2 97%   BMI (P) 47.02 kg/m  SpO2: SpO2: 97 % O2 Device: O2 Device: Nasal Cannula O2 Flow Rate: O2 Flow Rate (L/min): 4 L/min (titrated to 4L, wears 4 L at  home.)      Palliative Assessment/Data: 30% at best   Palliative Care Assessment & Plan   Patient Profile: 68 y.o. female  with past medical history of  insulin -dependent type 2 diabetes, chronic heart failure with preserved EF, morbid obesity, bedbound, pulmonary hypertension admitted on 11/29/2023 with edema and weight gain.    Patient was admitted for acute exacerbation of chronic heart failure with preserved EF.  Her hospital course is complicated by low output state requiring milrinone infusion and lasix  infusion, as well as worsening AKI with Cr of 5.26 increased today from 4.3. Requiring transfer to ICU for CRRT. PMT has been consulted to assist with goals of care conversation.  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  Patient is too confused today Psychosocial and emotional support provided PMT will continue to follow and support   Prognosis: Very poor  Discharge Planning: To Be Determined   Care plan  was discussed with patient, patient's son and daughter-in-law, RN         Jaydian Santana [name], PA-C  Palliative Medicine Team Team phone # [phone]  Thank you for allowing the Palliative Medicine Team to assist in the care of this patient. Please utilize secure chat with additional questions, if there is no response within 30 minutes please call the above phone number.  Palliative Medicine Team providers are available by phone from 7am to 7pm daily and can be reached through the team cell phone.  Should this patient require assistance outside of these hours, please call the patient's attending physician.

## 2023-12-19 NOTE — TOC Progression Note (Addendum)
 Transition of Care [facility]) - Progression Note    Patient Details  Name: [name] [name] MRN: [id number] Date of Birth: [birth year]  Transition of [facility]) CM/SW Contact  [name], RN Phone Number: [phone] 12/19/2023, 12:11 PM  Clinical Narrative:    Now on CRRT, IV abx, and Dobutamine gtt, palliative team following for GOC. Pt was scheduled dc home with HH and home health aides, when she declined medically. Transferred to ICU. Son is pt is emergency contact.   Chart reviewed for discharge readiness, patient not medically stable for d/c. Inpatient CM/CSW will continue to monitor pt's advancement through interdisciplinary progression rounds.   If new pt transition needs arise, MD please place a TOC consult.    Expected Discharge Plan: Home w Home Health Services Barriers to Discharge: Continued Medical Work up    Expected Discharge Plan and Services In-house Referral: NA Discharge Planning Services: CM Consult Post Acute Care Choice: Home Health Living arrangements for the past 2 months: Apartment                 DME Arranged: N/A DME Agency: NA       HH Arranged: RN HH Agency: NA         Social Drivers of Health (SDOH) Interventions SDOH Screenings   Food Insecurity: No Food Insecurity (11/29/2023)  Housing: Low Risk  (11/29/2023)  Transportation Needs: No Transportation Needs (11/29/2023)  Utilities: Not At Risk (11/29/2023)  Alcohol  Screen: Low Risk  (03/29/2023)  Depression (PHQ2-9): Low Risk  (03/29/2023)  Financial Resource Strain: Patient Declined (05/16/2022)   Received from [facility]  Physical Activity: Inactive (03/29/2023)  Social Connections: Unknown (12/01/2023)  Stress: No Stress Concern Present (05/16/2022)   Received from [facility]  Tobacco Use: Medium Risk (12/01/2023)    Readmission Risk Interventions    12/02/2023   11:20 AM 03/28/2023   11:29 AM 02/23/2023   11:48 AM  Readmission Risk Prevention Plan  Transportation  Screening Complete Complete Complete  PCP or Specialist Appt within 3-5 Days   Complete  HRI or Home Care Consult   Complete  Social Work Consult for Recovery Care Planning/Counseling   Complete  Palliative Care Screening   Not Applicable  Medication Review Oceanographer) Complete Complete Complete  PCP or Specialist appointment within 3-5 days of discharge Complete    HRI or Home Care Consult Complete Complete   SW Recovery Care/Counseling Consult Complete Complete   Palliative Care Screening Not Applicable Not Applicable   Skilled Nursing Facility Not Applicable Not Applicable
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
## 2023-12-19 NOTE — Progress Notes (Addendum)
 Initial Nutrition Assessment  DOCUMENTATION CODES:   Not applicable  INTERVENTION:   Downgrade diet to Dysphagia 3 (easy to chew); if pt still with difficulty chewing meats, etc; may need to downgrade further. Add 1500 mL fluid restriction  Magic cup BID with meals, each supplement provides 290 kcal and 9 grams of protein  30 ml ProSource Plus BID between meals, each supplement provides 100 kcals and 15 grams protein.   Ensure Plus High Protein po daily as bedtime snack, each supplement provides 350 kcal and 20 grams of protein  Add Renal MVI  NUTRITION DIAGNOSIS:   Increased nutrient needs related to acute illness as evidenced by estimated needs.  GOAL:   Patient will meet greater than or equal to 90% of their needs  MONITOR:   PO intake, Supplement acceptance, Labs, Weight trends  REASON FOR ASSESSMENT:   Consult Assessment of nutrition requirement/status (CRRT)  ASSESSMENT:   68 yo female admitted with acute on chronic HFpEF with severe pulmonary HTN, AKI on CKD IV. PMH OSA/OHS, HFpEF, RV failure, pulmonary HTN, CKD IV, DM. Pt has been bedbound x 3 years (stand to pivot at baseline)  10/08 Admitted, acute on chronic HFpEF, severe pulmonary HTN 10/27 CRRT  Pt alert on visit today, watching [employer]. Currently on CRRT-UF 100 ml/hr, noted plan for 1 week trial of CRRT. Pt not candidate for outpatient HD Dobutamine 2.5.   Pt reports appetite was good prior to admission; pt lives at home alone but has caregivers that come during the day. 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 Pt likes junk food.  Pt is pleasant and communicative but does not provide much information. Reviewed PMT notes indicating cognitive impairment, confusion.   Recorded po intake 50-100 % of meals with last meal recorded on 10/26 (Breakfast)  Pt is edentulous; has bottom dentures but not top dentures. RD observed pt attempt to chew the chunks of chicken  carnitas without much success. Pt reports she eats ground meat at home, like chunks of ground meat, not whole hamburger patty. Plan to downgrade diet to Dysphagia 3 (easy to chew). Pt ate good breakfast this AM per RN but then only bites at lunch.   BiPap at night for OSA/OHS  BUN 116, Creatinine 5.26 prior to initiation of CRRT yesterday  Weight down to 116.6 kg from 123.1 kg yesterday. Admission wt around 134.9 kg. Pt does not know her UBW. Pt reports she is 5'1 tall; family reports she is 4'11  +BM yesterday, type 6 medium. Noted pt required FMS previously this admission. Noted orders for acidophilus, fibercon, miralax , protonix   Pt with long standing history of diarrhea. Noted concern for colovaginal fistula in the past but work-up did not suggest this.   Noted stage 2 PI on buttocks documented; however, RNs today indicating it is most likely more of a moisture associated wound, pt with significant MASD on buttocks, groin, under breasts  Labs: Sodium 133 (L) Potassium 3.4 (L) Phosphorus 2.8 (wdl) Magnesium  1.8 (wdl)  Meds: Calcitriol SS novolog  Semglee  Midodrine  Miralax  KCl Fibercon  NUTRITION - FOCUSED PHYSICAL EXAM:   Difficult exam given significant edema and overall body habitus. Plan to re-attempt on follow-up post diuresis   Diet Order:  HEART HEALTHY   EDUCATION NEEDS:   Not appropriate for education at this time  Skin:  Skin Assessment: Skin Integrity Issues: Skin Integrity Issues:: Stage II Stage II: buttocks  Last BM:  10/27  Height:  Ht Readings from Last 1 Encounters:  11/29/23 (P) 5' 2 (1.575 m)    Weight:   Wt Readings from Last 1 Encounters:  12/19/23 116.6 kg     BMI:  Body mass index is 47.02 kg/m (pended).  Estimated Nutritional Needs:   Kcal:  1650-1850 kcals  Protein:  100-125 g  Fluid:  1.5 L   Betsey Finger MS, RDN, LDN, CNSC Registered Dietitian 3 Clinical Nutrition RD Inpatient Contact Info in Amion

## 2023-12-19 NOTE — Progress Notes (Addendum)
 Patient ID: [name] [name], female   DOB: [birth year], 68 y.o.   MRN: [id number]    Cardiologist: Jerel Balding, MD  Chief Complaint: Acute renal failure Subjective:    Co-ox 65% on DBA 2.5 with midodrine  10.  Now on CRRT. CVP 15 Afebrile. WBC 18>22>24>28. Procal 1  On linezolid  + ceftriaxone .   Sitting up in bed. No shortness of breath, CP. Family at bedside.  Objective:    Weight Range: 116.6 kg Body mass index is 47.02 kg/m (pended).   Vital Signs:   Temp:  [97.1 F (36.2 C)-99.6 F (37.6 C)] 97.8 F (36.6 C) (10/28 0813) Pulse Rate:  [99-130] 113 (10/28 1000) Resp:  [9-26] 26 (10/28 1000) BP: (72-149)/(44-122) 128/81 (10/28 1000) SpO2:  [89 %-100 %] 92 % (10/28 1000) Weight:  [116.6 kg-123.1 kg] 116.6 kg (10/28 0500) Last BM Date : 12/18/23  Weight change: Filed Weights   12/17/23 0500 12/18/23 1538 12/19/23 0500  Weight: 121.4 kg 123.1 kg 116.6 kg   Intake/Output:  Intake/Output Summary (Last 24 hours) at 12/19/2023 1012 Last data filed at 12/19/2023 1000 Gross per 24 hour  Intake 2354.31 ml  Output 1797 ml  Net 557.31 ml   Physical Exam    General: Sedentary appearing. No distress on [place] Cardiac: JVP ~10. S1 and S2 present. No murmurs. Extremities: Warm and dry.  2+ BLE edema.  Neuro: Alert and oriented x3. Affect pleasant.   Telemetry   ST 100s (personally reviewed)  Labs    CBC Recent Labs    12/18/23 0230 12/18/23 1008 12/19/23 0430  WBC 23.7*  --  27.7*  HGB 7.7*  --  8.4*  HCT 27.1*  --  28.9*  MCV 75.9*  --  74.7*  PLT 136* 134* 155   Basic Metabolic Panel Recent Labs    89/72/74 0230 12/19/23 0430  NA 132* 133*  K 3.7 3.4*  CL 88* 92*  CO2 29 25  GLUCOSE 181* 147*  BUN 116* 74*  CREATININE 5.26* 3.59*  CALCIUM  9.1 9.0  MG 1.5* 1.8  PHOS  --  2.8   Liver Function Tests Recent Labs    12/19/23 0430  ALBUMIN  2.9*   No results for input(s): LIPASE, AMYLASE in the last 72 hours. Cardiac Enzymes No results for  input(s): CKTOTAL, CKMB, CKMBINDEX, TROPONINI in the last 72 hours.  BNP: BNP (last 3 results) Recent Labs    01/13/23 1811 02/15/23 2105 03/27/23 1622  BNP 334.2* 432.5* 462.5*    ProBNP (last 3 results) Recent Labs    11/29/23 1531 11/30/23 0300 12/11/23 1114  PROBNP 1,820.0* 1,624.0* 5,609.0*     D-Dimer Recent Labs    12/18/23 1008  DDIMER 0.59*   Hemoglobin A1C No results for input(s): HGBA1C in the last 72 hours. Fasting Lipid Panel No results for input(s): CHOL, HDL, LDLCALC, TRIG, CHOLHDL, LDLDIRECT in the last 72 hours. Thyroid Function Tests No results for input(s): TSH, T4TOTAL, T3FREE, THYROIDAB in the last 72 hours.  Invalid input(s): FREET3  Other results:  Imaging   DG CHEST PORT 1 VIEW Result Date: 12/18/2023 EXAM: 1 VIEW XRAY OF THE CHEST 12/18/2023 05:07:00 PM COMPARISON: None available. CLINICAL HISTORY: Encounter for central line placement. FINDINGS: LINES, TUBES AND DEVICES: Right-sided central venous catheter tip projects over the distal SVC. Right upper extremity PICC tip ends in the distal SVC. LUNGS AND PLEURA: There is central pulmonary vascular congestion. No focal pulmonary opacity. No pleural effusion. No pneumothorax. HEART AND MEDIASTINUM: The heart is enlarged. No  acute abnormality of the mediastinal silhouette. BONES AND SOFT TISSUES: No acute osseous abnormality. IMPRESSION: 1. Enlarged heart and central pulmonary vascular congestion. 2. Right-sided central venous catheter and right upper extremity PICC tip in appropriate position. Electronically signed by: Greig Pique MD 12/18/2023 05:46 PM EDT RP Workstation: [id number]   CT ABDOMEN PELVIS WO CONTRAST Result Date: 12/18/2023 CLINICAL DATA:  Patient admitted with CHF exacerbation. Abdominal pain. EXAM: CT ABDOMEN AND PELVIS WITHOUT CONTRAST TECHNIQUE: Multidetector CT imaging of the abdomen and pelvis was performed following the standard protocol  without IV contrast. RADIATION DOSE REDUCTION: This exam was performed according to the departmental dose-optimization program which includes automated exposure control, adjustment of the mA and/or kV according to patient size and/or use of iterative reconstruction technique. COMPARISON:  CT abdomen and pelvis dated 12/05/2023 FINDINGS: Lower chest: No focal consolidation or pulmonary nodule in the lung bases. No pleural effusion or pneumothorax demonstrated. Partially imaged heart size is normal. Hepatobiliary: No focal hepatic lesions. No intra or extrahepatic biliary ductal dilation. Normal gallbladder. Pancreas: No focal lesions or main ductal dilation. Spleen: Normal in size without focal abnormality. Adrenals/Urinary Tract: No adrenal nodules. No suspicious renal mass, calculi or hydronephrosis. No focal bladder wall thickening. Stomach/Bowel: Normal appearance of the stomach. Metallic radiodensity within the gastric antrum/duodenal bulb, likely postprocedural. No evidence of bowel wall thickening, distention, or inflammatory changes. Colonic diverticulosis without acute diverticulitis. Appendix is not discretely seen. Vascular/Lymphatic: Aortic atherosclerosis. No enlarged abdominal or pelvic lymph nodes. Reproductive: No adnexal masses. Other: No free fluid, fluid collection, or free air. Musculoskeletal: No acute or abnormal lytic or blastic osseous lesions. Multifocal rounded subcutaneous anterior abdominal densities, likely injection related. L4-5 ankylosis. IMPRESSION: 1. No acute abdominopelvic findings. 2. Multifocal subcutaneous anterior abdominal densities, likely injection related. 3. Colonic diverticulosis without acute diverticulitis. 4. Aortic Atherosclerosis (ICD10-I70.0). Electronically Signed   By: Limin  Xu M.D.   On: 12/18/2023 14:21   Medications:    Scheduled Medications:  acidophilus  2 capsule Oral TID   allopurinol   100 mg Oral QHS   arformoterol  15 mcg Nebulization BID    atorvastatin   10 mg Oral QHS   budesonide (PULMICORT) nebulizer solution  0.5 mg Nebulization BID   calcitRIOL  0.25 mcg Oral Q M,W,F   Chlorhexidine  Gluconate Cloth  6 each Topical Daily   diclofenac Sodium  2 g Topical QID   fluticasone  2 spray Each Nare Daily   Gerhardt's butt cream   Topical BID   heparin  injection (subcutaneous)  5,000 Units Subcutaneous Q8H   insulin  aspart  0-20 Units Subcutaneous TID WC   insulin  aspart  0-5 Units Subcutaneous QHS   insulin  glargine-yfgn  25 Units Subcutaneous Daily   ipratropium-albuterol   3 mL Nebulization BID   loratadine   10 mg Oral Daily   midodrine   10 mg Oral TID WC   nystatin  cream  1 Application Topical BID   pantoprazole   40 mg Oral QAC breakfast   polycarbophil  625 mg Oral Daily   polyethylene glycol  17 g Oral Daily   potassium chloride   20 mEq Oral BID   pregabalin   50 mg Oral Daily   sertraline  50 mg Oral q AM   sodium chloride  flush  10-40 mL Intracatheter Q12H   triamcinolone  1 Application Topical Daily    Infusions:  cefTRIAXone  (ROCEPHIN )  IV 200 mL/hr at 12/18/23 1250   DOBUTamine 2.5 mcg/kg/min (12/19/23 1000)   linezolid  (ZYVOX ) IV 300 mL/hr at 12/19/23 1000   phenylephrine  (  NEO-SYNEPHRINE) Adult infusion Stopped (12/19/23 0724)   potassium chloride  30 mL/hr at 12/19/23 1000   prismasol BGK 4/2.5 500 mL/hr at 12/19/23 0616   prismasol BGK 4/2.5 1,500 mL/hr at 12/19/23 0933   prismasol BGK 4/2.5 400 mL/hr at 12/19/23 0847    PRN Medications: acetaminophen  **OR** acetaminophen , albuterol , camphor-menthol , heparin , methocarbamol, naphazoline-pheniramine, nystatin , ondansetron  **OR** ondansetron  (ZOFRAN ) IV, mouth rinse, oxyCODONE , sodium chloride , sodium chloride  flush, traZODone   Assessment/Plan   1. 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  Echo this admission with EF 60-65%, mild RV dilation/mild RV dysfunction.  She was started on  milrinone for RV support and diuresed extensively.  RHC 10/22 showed that she still had significantly elevated right-sided filling pressures but with PCWP only 12.  Lasix  gtt at 15 mg/hr started.  This was stopped with rise in creatinine. She was switched to DBA with renal function. BP has been soft, started on midodrine  10 tid. Concern for sepsis worsening her clinical picture WBC up tp 28k today, also noted to be progressively anemic, hgb 13.3>7.7>8.4. - Continue midodrine .  - Concern for sepsis, procal 1.0. On cefrtiaxone + linezolid .  - D/w CCM and Palliative, will trial 1 week of CVVHD, with hope for renal recover. She would not be a good HD candidate. - Started CRRT late 10/27. UF goal 100/hr.  2. AKI on CKD stage IV: Worry for cardiorenal syndrome => ATN.  Creatinine up to 5.26 today with minimal UOP.  RV failure initially but now worry about an additional cause for low BP (sepsis, anemia).  - nephrology following, plan as above 3. Pulmonary hypertension: RHC showed severe PAH.  She had a V/Q scan earlier this year with no sign of chronic PE.  She is on home oxygen  and Bipap at night. 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  - Suspect that she would not get much benefit from pulmonary vasodilators.   - Continue oxygen  during the day, Bipap at night.  - Sent rheumatalogic serologies (?WHO group 1 PH component). Negative ANA. Neg RF, Neg scl 70, neg centroeme ab. 4. ID: Concern for infection, ?UTI, with sepsis syndrome.  WBCs 28k today. Urine culture with contaminant, now anuric. BCX no growth to date. PCT 1.0 10/27, prior to CRRT (renal dysfunction). - She is empirically on linezolid /ceftriaxone .  5. Anemia: Hgb trended down from 13.3 to 8.4.  No overt bleeding.  ?Etiology.  - tsat 9, would favor IV Fe  - transfuse <8  - PPI  Social/Dispo: Long discussion with palliative care team and CCM yesterday. Agreed to trial 1 week of CVVHD  inpatient. Not a candidate for outpatient HD with end-stage RV failure. This was discussed with patient and her family. Full code at this time. Continue GOC discussions.  Length of Stay: 20  Jordan Lee, NP  12/19/2023, 10:12 AM  Advanced Heart Failure Team Pager [phone] (M-F; 7a - 5p)  Please contact [facility] for night-coverage after hours (4p -7a ) and weekends on [email or web address]  Agree with above.  On CRRT. Tolerating well. Denies SOB. On DBA 2.5 and midodrine  10 tid for BP/RV support.   General:  Obese woman lying in bed  No resp difficulty HEENT: normal Neck: supple RIJ HD cath Cor: Regular tachy Lungs: clear Abdomen: obese soft, nontender, nondistended.Good bowel sounds. Extremities: no cyanosis, clubbing, rash, 2+ edema Neuro: alert & orientedx3, cranial nerves grossly intact. moves all 4 extremities w/o  difficulty. Affect pleasant  She is tolerating volume removal with CRRT. I agree that she is not long-term HD candidate. Will continue volume removal. Continue DBA.  If no renal recovery will need to consider transition to comfort care.   CRITICAL CARE Performed by: Cherrie Sieving  Total critical care time: 39 minutes  Critical care time was exclusive of separately billable procedures and treating other patients.  Critical care was necessary to treat or prevent imminent or life-threatening deterioration.  Critical care was time spent personally by me (independent of midlevel providers or residents) on the following activities: development of treatment plan with patient and/or surrogate as well as nursing, discussions with consultants, evaluation of patient's response to treatment, examination of patient, obtaining history from patient or surrogate, ordering and performing treatments and interventions, ordering and review of laboratory studies, ordering and review of radiographic studies, pulse oximetry and re-evaluation of patient's condition.  Sieving Cherrie,  MD  12:28 PM

## 2023-12-20 DIAGNOSIS — I5023 Acute on chronic systolic (congestive) heart failure: Secondary | ICD-10-CM

## 2023-12-20 DIAGNOSIS — R570 Cardiogenic shock: Secondary | ICD-10-CM | POA: Diagnosis not present

## 2023-12-20 DIAGNOSIS — J9620 Acute and chronic respiratory failure, unspecified whether with hypoxia or hypercapnia: Secondary | ICD-10-CM | POA: Diagnosis not present

## 2023-12-20 DIAGNOSIS — D696 Thrombocytopenia, unspecified: Secondary | ICD-10-CM | POA: Diagnosis not present

## 2023-12-20 DIAGNOSIS — I5033 Acute on chronic diastolic (congestive) heart failure: Secondary | ICD-10-CM | POA: Diagnosis not present

## 2023-12-20 DIAGNOSIS — N1832 Chronic kidney disease, stage 3b: Secondary | ICD-10-CM | POA: Diagnosis not present

## 2023-12-20 DIAGNOSIS — N179 Acute kidney failure, unspecified: Secondary | ICD-10-CM | POA: Diagnosis not present

## 2023-12-20 DIAGNOSIS — J9601 Acute respiratory failure with hypoxia: Secondary | ICD-10-CM | POA: Diagnosis not present

## 2023-12-20 DIAGNOSIS — J90 Pleural effusion, not elsewhere classified: Secondary | ICD-10-CM

## 2023-12-20 DIAGNOSIS — I4891 Unspecified atrial fibrillation: Secondary | ICD-10-CM

## 2023-12-20 LAB — RENAL FUNCTION PANEL
Albumin: 2.7 g/dL — ABNORMAL LOW (ref 3.5–5.0)
Albumin: 2.5 g/dL — ABNORMAL LOW (ref 3.5–5.0)
Anion gap: 11 (ref 5–15)
Anion gap: 12 (ref 5–15)
BUN: 25 mg/dL — ABNORMAL HIGH (ref 8–23)
BUN: 16 mg/dL (ref 8–23)
CO2: 25 mmol/L (ref 22–32)
CO2: 25 mmol/L (ref 22–32)
Calcium: 9.1 mg/dL (ref 8.9–10.3)
Calcium: 8.3 mg/dL — ABNORMAL LOW (ref 8.9–10.3)
Chloride: 97 mmol/L — ABNORMAL LOW (ref 98–111)
Chloride: 99 mmol/L (ref 98–111)
Creatinine, Ser: 1.75 mg/dL — ABNORMAL HIGH (ref 0.44–1.00)
Creatinine, Ser: 1.48 mg/dL — ABNORMAL HIGH (ref 0.44–1.00)
GFR, Estimated: 31 mL/min — ABNORMAL LOW (ref >60)
GFR, Estimated: 38 mL/min — ABNORMAL LOW (ref >60)
Glucose, Bld: 172 mg/dL — ABNORMAL HIGH (ref 70–99)
Glucose, Bld: 165 mg/dL — ABNORMAL HIGH (ref 70–99)
Phosphorus: 1.5 mg/dL — ABNORMAL LOW (ref 2.5–4.6)
Phosphorus: 3.5 mg/dL (ref 2.5–4.6)
Potassium: 4.5 mmol/L (ref 3.5–5.1)
Potassium: 4.6 mmol/L (ref 3.5–5.1)
Sodium: 133 mmol/L — ABNORMAL LOW (ref 135–145)
Sodium: 136 mmol/L (ref 135–145)

## 2023-12-20 LAB — CBC
HCT: 27.3 % — ABNORMAL LOW (ref 36.0–46.0)
Hemoglobin: 7.7 g/dL — ABNORMAL LOW (ref 12.0–15.0)
MCH: 21.6 pg — ABNORMAL LOW (ref 26.0–34.0)
MCHC: 28.2 g/dL — ABNORMAL LOW (ref 30.0–36.0)
MCV: 76.5 fL — ABNORMAL LOW (ref 80.0–100.0)
Platelets: 158 K/uL (ref 150–400)
RBC: 3.57 MIL/uL — ABNORMAL LOW (ref 3.87–5.11)
RDW: 20.6 % — ABNORMAL HIGH (ref 11.5–15.5)
WBC: 20.1 K/uL — ABNORMAL HIGH (ref 4.0–10.5)
nRBC: 0.7 % — ABNORMAL HIGH (ref 0.0–0.2)

## 2023-12-20 LAB — COOXEMETRY PANEL
Carboxyhemoglobin: 0.7 % (ref 0.5–1.5)
Methemoglobin: 1.5 % (ref 0.0–1.5)
O2 Saturation: 66.6 %
Total hemoglobin: 7.9 g/dL — ABNORMAL LOW (ref 12.0–16.0)

## 2023-12-20 LAB — PHOSPHORUS: Phosphorus: 2.7 mg/dL (ref 2.5–4.6)

## 2023-12-20 LAB — PREPARE RBC (CROSSMATCH)

## 2023-12-20 LAB — GLUCOSE, CAPILLARY
Glucose-Capillary: 158 mg/dL — ABNORMAL HIGH (ref 70–99)
Glucose-Capillary: 160 mg/dL — ABNORMAL HIGH (ref 70–99)
Glucose-Capillary: 163 mg/dL — ABNORMAL HIGH (ref 70–99)
Glucose-Capillary: 120 mg/dL — ABNORMAL HIGH (ref 70–99)

## 2023-12-20 LAB — MAGNESIUM: Magnesium: 2.4 mg/dL (ref 1.7–2.4)

## 2023-12-20 LAB — APTT: aPTT: 50 s — ABNORMAL HIGH (ref 24–36)

## 2023-12-20 MED ORDER — CAPSAICIN 0.075 % EX CREA
TOPICAL_CREAM | Freq: Two times a day (BID) | CUTANEOUS | Status: DC
  Administered 2023-12-20 – 2023-12-24 (×3): 1 via TOPICAL
  Filled 2023-12-20: qty 57

## 2023-12-20 MED ORDER — SODIUM CHLORIDE 0.9 % IV SOLN
2.0000 g | INTRAVENOUS | Status: AC
  Administered 2023-12-21 – 2023-12-23 (×3): 2 g via INTRAVENOUS
  Filled 2023-12-20 (×3): qty 20

## 2023-12-20 MED ORDER — SODIUM CHLORIDE 0.9% IV SOLUTION: Freq: Once | INTRAVENOUS | Status: AC

## 2023-12-20 MED ORDER — PHENYLEPHRINE HCL-NACL 20-0.9 MG/250ML-% IV SOLN
INTRAVENOUS | Status: AC
  Administered 2023-12-20: 20 ug/min via INTRAVENOUS
  Filled 2023-12-20: qty 250

## 2023-12-20 MED ORDER — MIDODRINE HCL 5 MG PO TABS
15.0000 mg | ORAL_TABLET | Freq: Three times a day (TID) | ORAL | Status: DC
  Administered 2023-12-20 – 2023-12-26 (×17): 15 mg via ORAL
  Filled 2023-12-20 (×18): qty 3

## 2023-12-20 MED ORDER — FENTANYL CITRATE (PF) 50 MCG/ML IJ SOSY
12.5000 ug | PREFILLED_SYRINGE | INTRAMUSCULAR | Status: DC | PRN
  Administered 2023-12-21 – 2023-12-28 (×5): 12.5 ug via INTRAVENOUS
  Filled 2023-12-20 (×5): qty 1

## 2023-12-20 MED ORDER — PHENYLEPHRINE CONCENTRATED 100MG/250ML (0.4 MG/ML) INFUSION SIMPLE
0.0000 ug/min | INTRAVENOUS | Status: DC
  Administered 2023-12-21: 20 ug/min via INTRAVENOUS
  Filled 2023-12-20: qty 250

## 2023-12-20 MED ORDER — PHENYLEPHRINE HCL-NACL 20-0.9 MG/250ML-% IV SOLN
100.0000 ug/min | INTRAVENOUS | Status: DC
  Administered 2023-12-20: 20 ug/min via INTRAVENOUS

## 2023-12-20 MED ORDER — MIDODRINE HCL 5 MG PO TABS
15.0000 mg | ORAL_TABLET | Freq: Three times a day (TID) | ORAL | Status: DC
  Administered 2023-12-20: 15 mg via ORAL
  Filled 2023-12-20: qty 3

## 2023-12-20 MED ORDER — LINEZOLID 600 MG/300ML IV SOLN
600.0000 mg | Freq: Two times a day (BID) | INTRAVENOUS | Status: AC
  Administered 2023-12-20 – 2023-12-23 (×7): 600 mg via INTRAVENOUS
  Filled 2023-12-20 (×7): qty 300

## 2023-12-20 MED ORDER — POTASSIUM PHOSPHATES 15 MMOLE/5ML IV SOLN
30.0000 mmol | Freq: Once | INTRAVENOUS | Status: AC
  Administered 2023-12-20: 30 mmol via INTRAVENOUS
  Filled 2023-12-20: qty 10

## 2023-12-20 NOTE — Progress Notes (Signed)
 NAME:  [name] [name], MRN:  [id number], DOB:  Jan 21, 1956, LOS: 21 ADMISSION DATE:  11/29/2023, CONSULTATION DATE:  12/18/23 REFERRING MD:  Fairy Frames, CHIEF COMPLAINT:  diastolic heart failure   History of Present Illness:  68 year old female with known OSA/OHS, HFpEF, pulmonary hypertension, RV failure CKD 4, bedbound x 3 years admitted 10/8 with acute on chronic HFpEF with 2 week history of weight gain, lower extremity edema and SOB.  Her hospital course is complicated by low output state requiring milrinone infusion and lasix  infusion.  Due to ongoing challenges with continued congestion and worsening AKI despite high dose diuretics,  patient was transferred to ICU on 10/27 for Vascath placement and need for CVVHD  RHC 10/22: elevated RA Pressure with normal W.    Pertinent  Medical History  OSA/OHS, poor compliance with CPAP HFpEF Pulmonary hypertension RV failure CKD stage IV Bedbound x 3 years  Significant Hospital Events: Including procedures, antibiotic start and stop dates in addition to other pertinent events   10/8: Admitted with acute on chronic HFpEF with severe pulmonary hypertension-- started on intermittent diuresis 10/9: TTE LVEF 60-65% with RV function mildly reduced, enlarged RV  10/12: milrinone and lasix  infusions started 10/22 RHC: Elevated RA pressure with normal W, consistent with mod-severe PAH. CI 2.91 on milrinone  10/27: HD catheter placed and CRRT started. 10/29 Remains of CRRT for 1 week trial   Interim History / Subjective:  Tolerating CRRT, day 2 of week trial   Patient alert, pleasantly confused  No significant events overnight, complaining of neuropathic pain of lower extremities  Which is chronic   Coox 66.6, on dobutamine   Objective    Blood pressure (!) 107/51, pulse (!) 103, temperature 98.1 F (36.7 C), temperature source Oral, resp. rate 16, height (P) 5' 2 (1.575 m), weight 116.4 kg, SpO2 98%. CVP:  [9 mmHg-18 mmHg] 12 mmHg   FiO2 (%):  [40 %] 40 %   Intake/Output Summary (Last 24 hours) at 12/20/2023 1216 Last data filed at 12/20/2023 1100 Gross per 24 hour  Intake 880.29 ml  Output 3491.8 ml  Net -2611.51 ml   Filed Weights   12/18/23 1538 12/19/23 0500 12/20/23 0500  Weight: 123.1 kg 116.6 kg 116.4 kg    Examination: General: acute on chronic older female, lying in icu bed, bed bound in NAD  HEENT: Normocephalic, PERRLA intact, Pink MM CV: s1,s2, RRR, no MRG, No JVD  pulm: clear, diminished, no distress on 4L [place] Abs: bs active, soft  Extremities: very weak at baseline, bed bound,  Skin: patient not wanting for skin to be assessed, has cellulitis left breast-receiving trx for, 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 per RN staff  Neuro: Rass 0, intermittent confusion  GU: deferred per patient   Resolved problem list   Assessment and Plan  Acute on chronic respiratory failure OSA/OHS P: Continue baseline 4L [place] during the day and bipap at night Continue Scheduled BD and prns O2 Sat goal >92%   Acute on chronic HFpEF Pulmonary hypertension, severe - likely WHO 3 RV dysfunction Milrinone started 10/12 > converted to dobutamine 10/27 P: Advance heart team following, appreciate assistance Continue dobutamine gtt, continue to trend Coox  Continue CRRT, keep net negative, appreciate nephros assistance as well   Acute on chronic CKD stage IIIb Baseline creatinine around 2.1 Peak at 5.26 - 10/27 Cr 1.75 P: Continue to trend renal function daily  Continue to monitor and optimize electrolytes daily Continue to monitor urine output Continue strict I/Os  Continue Adequate renal perfusion  Avoid nephrotoxic agents  Nephro following, appreciate assitance  Leukocytosis Left breast cellulitis (completed 7 day abx on presentation) -- WBC: 27, started up trending on 10/22, now 20.1  -- Cultures: Blood cultures 10/26 NGTD, Urine culture with multiple species.  -- Imaging: CT Abdomen and Pelvis: no acute findings,  multifocal subcutaneous anterior abdominal densities felt to be injection related, colonic diverticulosis - no diverticulitis. 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  -- Empiric antibiotics started 10/25 with Zyvox /Ceftriaxone  - P: Continue zyvox  and rocephin  for 7 days, discussed with pharmacy  Continue to trend WBC, fever curve  Thrombocytopenia Anemia Plt WNL Hgb 7.7 from 8.4 P: On CRRT, filter has clotted- not able to give blood back, suspect drop in hgb from this Continue to monitor s/s of bleeding Transfuse for hgb <7  Continue DVT ppx   DM type II P: Continue SSI  Continue [place]  Continue CBG ACHS  Metabolic encephalopathy -- likely multifactorial with uremia, prolonged hospital course -- [facility] 10/21 without acute process -- ammonia level normal  P: Continue delirium precautions Limit sedating medications Continue CRRT to help clear uremia   Decreased mobility at baseline Hx of pressure injuries- during this admission  MSAD  P: PT/OT as tolerated  Q2 hour turns  Consult wound care   GOC:  Plan for one week trial of CRRT, not a HD candidate Palliative discussing with family in regards to code status, as well as GOC P: Continue GOC discussions  Appreciate Palliative teams recs and assistance   Labs   CBC: Recent Labs  Lab 12/16/23 0450 12/17/23 0455 12/18/23 0230 12/18/23 1008 12/19/23 0430 12/20/23 0439  WBC 17.9* 22.1* 23.7*  --  27.7* 20.1*  HGB 9.3* 8.1* 7.7*  --  8.4* 7.7*  HCT 31.5* 27.7* 27.1*  --  28.9* 27.3*  MCV 73.6* 74.3* 75.9*  --  74.7* 76.5*  PLT 89* 106* 136* 134* 155 158    Basic Metabolic Panel: Recent Labs  Lab 12/17/23 0455 12/18/23 0230 12/19/23 0430 12/19/23 1603 12/20/23 0439  NA 134* 132* 133* 138 133*  K 3.7 3.7 3.4* 4.0 4.5  CL 88* 88* 92* 97* 97*  CO2 32 29 25 27 25   GLUCOSE 132* 181* 147* 159* 172*  BUN 105* 116* 74* 45* 25*   CREATININE 4.32* 5.26* 3.59* 2.42* 1.75*  CALCIUM  9.3 9.1 9.0 8.6* 9.1  MG  --  1.5* 1.8  --  2.4  PHOS  --   --  2.8 1.7* 1.5*   GFR: Estimated Creatinine Clearance: 36.5 mL/min (A) (by C-G formula based on SCr of 1.75 mg/dL (H)). Recent Labs  Lab 12/17/23 0455 12/17/23 1125 12/17/23 1537 12/18/23 0230 12/19/23 0430 12/20/23 0439  PROCALCITON 0.41  --   --  1.00  --   --   WBC 22.1*  --   --  23.7* 27.7* 20.1*  LATICACIDVEN  --  1.1 1.2  --   --   --     Liver Function Tests: Recent Labs  Lab 12/19/23 0430 12/19/23 1603 12/20/23 0439  ALBUMIN  2.9* 2.6* 2.7*   No results for input(s): LIPASE, AMYLASE in the last 168 hours. Recent Labs  Lab 12/18/23 1428  AMMONIA 29    ABG    Component Value Date/Time   PHART 7.39 12/05/2023 0752   PCO2ART 58 (H) 12/05/2023 0752   PO2ART <31 (LL) 12/05/2023 9247  HCO3 38.2 (H) 12/13/2023 1523   TCO2 40 (H) 12/13/2023 1523   ACIDBASEDEF 1.6 03/27/2023 2240   O2SAT 66.6 12/20/2023 0439     Coagulation Profile: Recent Labs  Lab 12/18/23 1008  INR 1.2    Cardiac Enzymes: No results for input(s): CKTOTAL, CKMB, CKMBINDEX, TROPONINI in the last 168 hours.  HbA1C: Hgb A1c MFr Bld  Date/Time Value Ref Range Status  11/29/2023 07:02 PM 8.8 (H) 4.8 - 5.6 % Final    Comment:    (NOTE) Diagnosis of Diabetes The following HbA1c ranges recommended by the American Diabetes Association (ADA) may be used as an aid in the diagnosis of diabetes mellitus.  Hemoglobin             Suggested A1C NGSP%              Diagnosis  <5.7                   Non Diabetic  5.7-6.4                Pre-Diabetic  >6.4                   Diabetic  <7.0                   Glycemic control for                       adults with diabetes.    01/15/2023 03:57 AM 9.2 (H) 4.8 - 5.6 % Final    Comment:    (NOTE) Pre diabetes:          5.7%-6.4%  Diabetes:              >6.4%  Glycemic control for   <7.0% adults with diabetes      CBG: Recent Labs  Lab 12/19/23 1130 12/19/23 1556 12/19/23 2111 12/20/23 0603 12/20/23 1125  GLUCAP 176* 159* 106* 158* 160*    Critical care time: 40 min    Christian Michol Emory AGACNP-BC   [facility] 12/20/2023, 1:10 PM  Please see [email or web address] for pager details.  From 7A-7P if no response, please call [phone]. After hours, please call ELink [phone].

## 2023-12-20 NOTE — Consult Note (Signed)
 WOC reconsult regarding skin breakdown to buttock in several areas, at bedside I spoke with primary RN and discussed case, seen by WOC on 12/18/23, in agreement that these are friction injuries in combination with MASD and not PIs, there could also be a shearing element occurring, continue same wound care orders using zinc  oxide-based ointment to buttock/ischial, reposition every 2 hours or less, off-load heels at all times to deter pressure issues to these sites, patient has many comorbidities that can contribute to further skin break. Nursing staff using dermal fabric under breast for moisture skin breakdown. The RN and myself reviewed WOC nurse note and recommendations.  WOC will not follow and will remove patient from census task list. Please reconsult if wound worsens in condition and notify provider.   Buttock 12/18/23   Sherrilyn Hals MSN RN [facility] WOC [facility]  [phone] (Available from 7-3 pm Mon-Friday)

## 2023-12-20 NOTE — Progress Notes (Addendum)
 Patient ID: [name] [name], female   DOB: 02-12-1956, 68 y.o.   MRN: [id number]    Cardiologist: Jerel Balding, MD  Chief Complaint: Acute renal failure Subjective:    Co-ox 67% on DBA 2.5 with midodrine  10.  Net -2.8L on CRRT. CVP 15 Afebrile. WBC 18>22>24>28>20 On linezolid  + ceftriaxone .   Lying in bed, not mobile. No shortness of breath. Has been visiting with family. No lightheadedness.  Objective:    Weight Range: 116.4 kg Body mass index is 46.94 kg/m (pended).   Vital Signs:   Temp:  [97.6 F (36.4 C)-98.2 F (36.8 C)] 98.1 F (36.7 C) (10/29 0805) Pulse Rate:  [102-124] 108 (10/29 0805) Resp:  [14-26] 20 (10/29 0805) BP: (76-131)/(30-94) 99/86 (10/29 0805) SpO2:  [86 %-100 %] 99 % (10/29 0805) FiO2 (%):  [40 %] 40 % (10/29 0750) Weight:  [116.4 kg] 116.4 kg (10/29 0500) Last BM Date : 12/18/23  Weight change: Filed Weights   12/18/23 1538 12/19/23 0500 12/20/23 0500  Weight: 123.1 kg 116.6 kg 116.4 kg   Intake/Output:  Intake/Output Summary (Last 24 hours) at 12/20/2023 0904 Last data filed at 12/20/2023 0800 Gross per 24 hour  Intake 1194.33 ml  Output 3890.8 ml  Net -2696.47 ml   Physical Exam    General: Sedentary, bed-bound appearing. No distress  Cardiac: S1 and S2 present. No murmurs  Extremities: Warm and dry.  2+ thigh edema. + ACE wraps Neuro: Alert and oriented x3. Affect flat  Telemetry   ST 100, rare short NSVT runs  Labs    CBC Recent Labs    12/19/23 0430 12/20/23 0439  WBC 27.7* 20.1*  HGB 8.4* 7.7*  HCT 28.9* 27.3*  MCV 74.7* 76.5*  PLT 155 158   Basic Metabolic Panel Recent Labs    89/71/74 0430 12/19/23 1603 12/20/23 0439  NA 133* 138 133*  K 3.4* 4.0 4.5  CL 92* 97* 97*  CO2 25 27 25   GLUCOSE 147* 159* 172*  BUN 74* 45* 25*  CREATININE 3.59* 2.42* 1.75*  CALCIUM  9.0 8.6* 9.1  MG 1.8  --  2.4  PHOS 2.8 1.7* 1.5*   Liver Function Tests Recent Labs    12/19/23 1603 12/20/23 0439  ALBUMIN  2.6* 2.7*    BNP (last 3 results) Recent Labs    01/13/23 1811 02/15/23 2105 03/27/23 1622  BNP 334.2* 432.5* 462.5*   ProBNP (last 3 results) Recent Labs    11/29/23 1531 11/30/23 0300 12/11/23 1114  PROBNP 1,820.0* 1,624.0* 5,609.0*   D-Dimer Recent Labs    12/18/23 1008  DDIMER 0.59*   Medications:    Scheduled Medications:  (feeding supplement) PROSource Plus  30 mL Oral BID BM   acidophilus  2 capsule Oral TID   allopurinol   100 mg Oral QHS   arformoterol  15 mcg Nebulization BID   atorvastatin   10 mg Oral QHS   budesonide (PULMICORT) nebulizer solution  0.5 mg Nebulization BID   calcitRIOL  0.25 mcg Oral Q M,W,F   Chlorhexidine  Gluconate Cloth  6 each Topical Daily   diclofenac Sodium  2 g Topical QID   feeding supplement  237 mL Oral Daily   fluticasone  2 spray Each Nare Daily   Gerhardt's butt cream   Topical BID   heparin  injection (subcutaneous)  5,000 Units Subcutaneous Q8H   insulin  aspart  0-20 Units Subcutaneous TID WC   insulin  aspart  0-5 Units Subcutaneous QHS   insulin  glargine-yfgn  25 Units Subcutaneous Daily  ipratropium-albuterol   3 mL Nebulization BID   loratadine   10 mg Oral Daily   midodrine   10 mg Oral TID WC   multivitamin  1 tablet Oral QHS   nystatin  cream  1 Application Topical BID   pantoprazole   40 mg Oral QAC breakfast   polycarbophil  625 mg Oral Daily   polyethylene glycol  17 g Oral Daily   pregabalin   50 mg Oral Daily   sertraline  50 mg Oral q AM   sodium chloride  flush  10-40 mL Intracatheter Q12H   triamcinolone  1 Application Topical Daily    Infusions:  cefTRIAXone  (ROCEPHIN )  IV Stopped (12/19/23 1311)   DOBUTamine 2.5 mcg/kg/min (12/20/23 0800)   heparin  10,000 units/ 20 mL infusion syringe 500 Units/hr (12/20/23 0752)   linezolid  (ZYVOX ) IV Stopped (12/19/23 2212)   phenylephrine  (NEO-SYNEPHRINE) Adult infusion Stopped (12/19/23 0724)   prismasol BGK 4/2.5 500 mL/hr at 12/20/23 0735   prismasol BGK 4/2.5 1,500 mL/hr  at 12/20/23 0214   prismasol BGK 4/2.5 400 mL/hr at 12/20/23 0619    PRN Medications: acetaminophen  **OR** acetaminophen , albuterol , camphor-menthol , heparin , methocarbamol, naphazoline-pheniramine, nystatin , ondansetron  **OR** ondansetron  (ZOFRAN ) IV, mouth rinse, oxyCODONE , sodium chloride , sodium chloride  flush, traZODone   Assessment/Plan   1. 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  Echo this admission with EF 60-65%, mild RV dilation/mild RV dysfunction.  She was started on milrinone for RV support and diuresed extensively.  RHC 10/22 showed that she still had significantly elevated right-sided filling pressures but with PCWP only 12.  Lasix  gtt at 15 mg/hr started.  This was stopped with rise in creatinine. She was switched to DBA with renal function. BP has been soft, started on midodrine  10 tid. Concern for sepsis worsening her clinical picture WBC up tp 28k>20k, also noted to be progressively anemic, hgb 13.3>7.7>8.4>7.7. - CVP 15. BP limiting UF, increase midodrine  to 15 mg tid - Concern for sepsis, procal 1.0. On cefrtiaxone + linezolid .  - D/w CCM and Palliative, will trial 1 week of CVVHD, with hope for renal recover. She would not be a good HD candidate. - Started CRRT late 10/27. UF goal 100-150/hr.  2. AKI on CKD stage IV: Worry for cardiorenal syndrome => ATN.  Creatinine up to 5.26 today with minimal UOP.  RV failure initially but now worry about an additional cause for low BP (sepsis, anemia).  - nephrology following, plan as above 3. Pulmonary hypertension: RHC showed severe PAH.  She had a V/Q scan earlier this year with no sign of chronic PE.  She is on home oxygen  and Bipap at night. 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  - Suspect that she would not get much benefit from pulmonary vasodilators.   - Continue oxygen  during the day, Bipap at  night.  - Sent rheumatalogic serologies (?WHO group 1 PH component). Negative ANA. Neg RF, Neg scl 70, neg centroeme ab. 4. ID: Concern for infection, ?UTI, with sepsis syndrome.  WBCs 28>20k today. Urine culture with contaminant, now anuric. BCX no growth to date. PCT 1.0 10/27, prior to CRRT (renal dysfunction). - She is empirically on linezolid /ceftriaxone .  5. Anemia: Hgb trended down from 13.3 to 7.7.  No overt bleeding.  ?Etiology.  Transfuse 1 u RBC today - tsat 9. - PPI  Social/Dispo: Long discussion with palliative care team and CCM yesterday. Agreed to trial 1 week of CVVHD inpatient. Not  a candidate for outpatient HD with end-stage RV failure. This was discussed with patient and her family. Full code at this time. Continue GOC discussions.  Length of Stay: 60  Jordan Lee, NP  12/20/2023, 9:04 AM  Advanced Heart Failure Team Pager [phone] (M-F; 7a - 5p)  Please contact [facility] for night-coverage after hours (4p -7a ) and weekends on [email or web address]  Agree with above  Remains on CVVHD - 2.8L. Anuric. On DBA 2.5 with midodrine  10 Co-ox 67%  Denies CP or SOB   General:  Obese elderly woman lying in bed No resp difficulty HEENT: normal Neck: supple. RIJ HD cath .  Cor: Regular tachy  Lungs: clear Abdomen: obese soft, nontender, nondistended.Good bowel sounds. Extremities: no cyanosis, clubbing, rash, 1-2+ edema Neuro: alert & orientedx3, cranial nerves grossly intact. moves all 4 extremities w/o difficulty. Affect pleasant  Volume status much improved on CVVHD. Remains anuric  Will continue CVVHD per Renal. Not candidate for iHD.   If no renal recovery will need transition to comfort care.   CRITICAL CARE Performed by: Cherrie Sieving  Total critical care time: 37 minutes  Critical care time was exclusive of separately billable procedures and treating other patients.  Critical care was necessary to treat or prevent imminent or life-threatening  deterioration.  Critical care was time spent personally by me (independent of midlevel providers or residents) on the following activities: development of treatment plan with patient and/or surrogate as well as nursing, discussions with consultants, evaluation of patient's response to treatment, examination of patient, obtaining history from patient or surrogate, ordering and performing treatments and interventions, ordering and review of laboratory studies, ordering and review of radiographic studies, pulse oximetry and re-evaluation of patient's condition.  Sieving Cherrie, MD  11:18 AM

## 2023-12-20 NOTE — Progress Notes (Signed)
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 take by mouth daily.   Yes [provider]  pramoxine (SARNA SENSITIVE) 1 % LOTN Apply 1 Application topically See admin instructions. Apply to affected areas 2 times a day   Yes [provider]  pregabalin  (LYRICA ) 75 MG capsule Take 75 mg by mouth in the morning and at bedtime.   Yes [provider]  PRESCRIPTION MEDICATION BiPAP- At  bedtime    Yes [provider]  selenium sulfide (SELSUN) 2.5 % lotion Apply 1 Application topically See admin instructions. Use as directed as a body wash   Yes [provider]  Semaglutide, 2 MG/DOSE, 8 MG/3ML SOPN Inject 2 mg into the skin every Thursday. 10/20/22  Yes [provider]  sertraline (ZOLOFT) 50 MG tablet Take 50 mg by mouth in the morning.   Yes [provider]  torsemide  (DEMADEX ) 20 MG tablet Take 2 tablets (40 mg total) by mouth 2 (two) times daily. Patient taking differently: Take 40 mg by mouth in the morning. 02/23/23  Yes Fairy Frames, MD  triamcinolone (KENALOG) 0.025 % cream Apply 1 Application topically See admin instructions. Apply a thin layer to affected areas of the face once a day   Yes [provider]  potassium chloride  SA (KLOR-CON  M) 20 MEQ tablet Take 1 tablet (20 mEq total) by mouth daily. Patient not taking: Reported on 11/30/2023 02/23/23   Fairy Frames, MD    Discontinued Meds:   Medications Discontinued During This Encounter  Medication Reason   midodrine  (PROAMATINE ) 5 MG tablet    furosemide  (LASIX ) injection 40 mg    Chlorhexidine  Gluconate Cloth 2 % PADS 6 each    Oral care mouth rinse    Oral care mouth rinse    furosemide  (LASIX ) injection 40 mg    insulin  glargine (LANTUS ) injection 50 Units    furosemide  (LASIX ) injection 80 mg    pregabalin  (LYRICA ) capsule 75 mg    furosemide  (LASIX ) 120 mg in dextrose  5 % 50 mL IVPB    furosemide  (LASIX ) 120 mg in dextrose  5 % 50 mL IVPB    fluticasone (FLONASE) 50 MCG/ACT nasal spray 1 spray    insulin  glargine (LANTUS ) injection 25 Units    insulin  glargine (LANTUS ) injection 10 Units    midodrine  (PROAMATINE ) tablet 5 mg    pregabalin  (LYRICA ) capsule 75 mg    pregabalin  (LYRICA ) capsule 75 mg    ceFAZolin  (ANCEF ) IVPB 2g/100 mL premix    ceFAZolin  (ANCEF ) IVPB 2g/100 mL premix    dextrose  5 %-0.9 % sodium chloride  infusion    potassium chloride  SA (KLOR-CON  M)  CR tablet 20 mEq    methylPREDNISolone  sodium succinate (SOLU-MEDROL ) 125 mg/2 mL injection 60 mg    insulin  glargine-yfgn (SEMGLEE ) injection 10 Units    senna-docusate (Senokot-S) tablet 1 tablet    ipratropium-albuterol  (DUONEB) 0.5-2.5 (3) MG/3ML nebulizer solution 3 mL    insulin  glargine-yfgn (SEMGLEE ) injection 20 Units    rOPINIRole (REQUIP) tablet 0.25 mg    furosemide  (LASIX ) 200 mg in dextrose  5 % 100 mL (2 mg/mL) infusion    midodrine  (PROAMATINE ) tablet 5 mg    insulin  glargine-yfgn (SEMGLEE ) injection 25 Units    metolazone (ZAROXOLYN) tablet 5 mg    furosemide  (LASIX ) injection 80 mg    furosemide  (LASIX ) injection 80 mg    furosemide  (LASIX ) injection 80 mg    lidocaine  (PF) (XYLOCAINE ) 1 % injection Patient Transfer   Heparin  (Porcine) in NaCl 1000-0.9 UT/500ML-% SOLN Patient Transfer   furosemide  (LASIX ) 200 mg in dextrose  5 % 100 mL (2 mg/mL) infusion    potassium chloride  SA (KLOR-CON  M) CR tablet 40 mEq    methylPREDNISolone  sodium succinate (SOLU-MEDROL ) 125 mg/2 mL injection 60 mg    predniSONE (DELTASONE) tablet 20 mg    hydrOXYzine  (ATARAX ) tablet 25 mg    hydrALAZINE  (APRESOLINE ) injection 10 mg    oxyCODONE  (Oxy IR/ROXICODONE ) immediate  release tablet 5 mg    heparin  injection 5,000 Units    pregabalin  (LYRICA ) capsule 75 mg    midodrine  (PROAMATINE ) tablet 5 mg    insulin  aspart (novoLOG ) injection 3 Units    insulin  glargine-yfgn (SEMGLEE ) injection 35 Units    oxyCODONE  (Oxy IR/ROXICODONE ) immediate release tablet 10 mg    milrinone (PRIMACOR) 20 MG/100 ML (0.2 mg/mL) infusion    liver oil-zinc  oxide (DESITIN) 40 % ointment    phenylephrine  (NEO-SYNEPHRINE) 20mg /NS premix infusion     Social History:  reports that she has quit smoking. She has never used smokeless tobacco. She reports current alcohol  use. She reports that she does not use drugs.  Family History:   Family History  Problem Relation Age of Onset   Diabetes Mother     Blood  pressure (!) 94/50, pulse (!) 104, temperature 98.1 F (36.7 C), temperature source Oral, resp. rate 17, height (P) 5' 2 (1.575 m), weight 116.4 kg, SpO2 98%. Physical Exam: General appearance: alert, cooperative, and morbidly obese Head: NCAT Eyes: negative Neck: no adenopathy, no carotid bruit Resp: distant BS Cardio: regular rate and rhythm GI: SNDNT +BS Extremities: edema 1+ Pulses: 2+ and symmetric     Edwinna Rochette, LYNWOOD ORN, MD 12/20/2023, 10:31 AM

## 2023-12-20 NOTE — Plan of Care (Signed)
   Problem: Nutritional: Goal: Maintenance of adequate nutrition will improve Outcome: Progressing Goal: Progress toward achieving an optimal weight will improve Outcome: Progressing

## 2023-12-20 NOTE — Progress Notes (Signed)
 Daily Progress Note   Patient Name: [name] [name]       Date: 12/20/2023 DOB: 1955-09-25  Age: 68 y.o. MRN#: [id number] Attending Physician: Gatha Pence, MD Primary Care Physician: [facility] Admit Date: 11/29/2023  Reason for Consultation/Follow-up: Establishing goals of care  Subjective: Medical records reviewed including progress notes, labs and imaging. Patient assessed at the bedside. Having a lot of neuropathic pain per nursing despite Oxycodone , Lyrica . No visitors present, son is at HD.  Created space and opportunity for patient's thoughts and feelings on her current illness. [name] [name] does not engage much and [name] [name] is slow to respond when [name] [name] does. Called patient's son later on in the afternoon. He states that he has not had any luck discussing code status or other care preferences with her either. Patient's sister is present visiting today. They feel [name] [name] may be trying to avoid these difficult conversations and acting more confused than [name] [name] really is. Son would like to attempt again tomorrow morning at 9 am.  Questions and concerns addressed. PMT will continue to support holistically.   Length of Stay: 21   Physical Exam Vitals and nursing note reviewed.  Constitutional:      General: [name] [name] is not in acute distress.    Appearance: [name] [name] is ill-appearing.  HENT:     Head: Normocephalic and atraumatic.  Cardiovascular:     Rate and Rhythm: Tachycardia present.  Pulmonary:     Effort: Pulmonary effort is normal.  Neurological:     Comments: Doesn't answer when asked the location, states 2024 for year  Psychiatric:        Behavior: Behavior normal.        Cognition and Memory: Cognition is impaired.            Vital Signs: BP 99/86 (BP Location: Left Wrist)   Pulse (!) 108   Temp 98.1 F (36.7 C) (Oral)    Resp 20   Ht (P) 5' 2 (1.575 m)   Wt 116.4 kg   SpO2 99%   BMI (P) 46.94 kg/m  SpO2: SpO2: 99 % O2 Device: O2 Device: Nasal Cannula O2 Flow Rate: O2 Flow Rate (L/min): 5 L/min      Palliative Assessment/Data: 30% at best   Palliative Care Assessment & Plan   Patient Profile: 68 y.o. female  with past medical history of  insulin -dependent type 2 diabetes, chronic heart failure with preserved EF, morbid obesity, bedbound, pulmonary hypertension admitted on 11/29/2023 with edema and weight gain.    Patient was admitted for acute exacerbation of chronic heart failure with preserved EF.  Her hospital course  is complicated by low output state requiring milrinone infusion and lasix  infusion, as well as worsening AKI with Cr of 5.26 increased today from 4.3. Requiring transfer to ICU for CRRT. PMT has been consulted to assist with goals of care conversation.  Assessment: Goals of care conversation Severe RV failure AKI on CKD 5, now on CRRT Cardiorenal syndrome/ATN Pulmonary hypertension Concern for sepsis   Recommendations/Plan: Continue full code/full scope treatment Continue time-limited trial of CRRT for max of 1 week (would be until Monday 11/3) Ordered fentanyl  2.5 mg Q4H PRN for severe pain Ordered capsicin cream BID for feet/neuropathic pain Ongoing goals of care discussions.  Patient's family is concerned that [name] [name] is acting confused to avoid difficult conversations Meet with patient's son tomorrow 10/30 at 9am PMT will continue to follow and support   Prognosis: Very poor  Discharge Planning: To Be Determined   Care plan was discussed with patient, patient's son, RN         Amori Colomb [name], PA-C  Palliative Medicine Team Team phone # [phone]  Thank you for allowing the Palliative Medicine Team to assist in the care of this patient. Please utilize secure chat with additional questions, if there is no response within 30 minutes please call the above phone  number.  Palliative Medicine Team providers are available by phone from 7am to 7pm daily and can be reached through the team cell phone.  Should this patient require assistance outside of these hours, please call the patient's attending physician.

## 2023-12-21 DIAGNOSIS — N1832 Chronic kidney disease, stage 3b: Secondary | ICD-10-CM | POA: Diagnosis not present

## 2023-12-21 DIAGNOSIS — I5023 Acute on chronic systolic (congestive) heart failure: Secondary | ICD-10-CM | POA: Diagnosis not present

## 2023-12-21 DIAGNOSIS — J9601 Acute respiratory failure with hypoxia: Secondary | ICD-10-CM | POA: Diagnosis not present

## 2023-12-21 DIAGNOSIS — I5033 Acute on chronic diastolic (congestive) heart failure: Secondary | ICD-10-CM | POA: Diagnosis not present

## 2023-12-21 DIAGNOSIS — D696 Thrombocytopenia, unspecified: Secondary | ICD-10-CM | POA: Diagnosis not present

## 2023-12-21 DIAGNOSIS — J9620 Acute and chronic respiratory failure, unspecified whether with hypoxia or hypercapnia: Secondary | ICD-10-CM | POA: Diagnosis not present

## 2023-12-21 DIAGNOSIS — N179 Acute kidney failure, unspecified: Secondary | ICD-10-CM | POA: Diagnosis not present

## 2023-12-21 DIAGNOSIS — R570 Cardiogenic shock: Secondary | ICD-10-CM | POA: Diagnosis not present

## 2023-12-21 LAB — TYPE AND SCREEN
ABO/RH(D): B POS
Antibody Screen: NEGATIVE
Unit division: 0

## 2023-12-21 LAB — GLUCOSE, CAPILLARY
Glucose-Capillary: 109 mg/dL — ABNORMAL HIGH (ref 70–99)
Glucose-Capillary: 181 mg/dL — ABNORMAL HIGH (ref 70–99)
Glucose-Capillary: 129 mg/dL — ABNORMAL HIGH (ref 70–99)
Glucose-Capillary: 110 mg/dL — ABNORMAL HIGH (ref 70–99)

## 2023-12-21 LAB — RENAL FUNCTION PANEL
Albumin: 2.7 g/dL — ABNORMAL LOW (ref 3.5–5.0)
Albumin: 2.9 g/dL — ABNORMAL LOW (ref 3.5–5.0)
Anion gap: 14 (ref 5–15)
Anion gap: 14 (ref 5–15)
BUN: 8 mg/dL (ref 8–23)
BUN: 8 mg/dL (ref 8–23)
CO2: 25 mmol/L (ref 22–32)
CO2: 24 mmol/L (ref 22–32)
Calcium: 9 mg/dL (ref 8.9–10.3)
Calcium: 9 mg/dL (ref 8.9–10.3)
Chloride: 96 mmol/L — ABNORMAL LOW (ref 98–111)
Chloride: 96 mmol/L — ABNORMAL LOW (ref 98–111)
Creatinine, Ser: 1.35 mg/dL — ABNORMAL HIGH (ref 0.44–1.00)
Creatinine, Ser: 1.43 mg/dL — ABNORMAL HIGH (ref 0.44–1.00)
GFR, Estimated: 42 mL/min — ABNORMAL LOW (ref >60)
GFR, Estimated: 39 mL/min — ABNORMAL LOW (ref >60)
Glucose, Bld: 140 mg/dL — ABNORMAL HIGH (ref 70–99)
Glucose, Bld: 141 mg/dL — ABNORMAL HIGH (ref 70–99)
Phosphorus: 2.4 mg/dL — ABNORMAL LOW (ref 2.5–4.6)
Phosphorus: 2.7 mg/dL (ref 2.5–4.6)
Potassium: 4.1 mmol/L (ref 3.5–5.1)
Potassium: 4.5 mmol/L (ref 3.5–5.1)
Sodium: 135 mmol/L (ref 135–145)
Sodium: 134 mmol/L — ABNORMAL LOW (ref 135–145)

## 2023-12-21 LAB — BASIC METABOLIC PANEL WITH GFR
Anion gap: 14 (ref 5–15)
BUN: 8 mg/dL (ref 8–23)
CO2: 25 mmol/L (ref 22–32)
Calcium: 9.1 mg/dL (ref 8.9–10.3)
Chloride: 97 mmol/L — ABNORMAL LOW (ref 98–111)
Creatinine, Ser: 1.32 mg/dL — ABNORMAL HIGH (ref 0.44–1.00)
GFR, Estimated: 43 mL/min — ABNORMAL LOW (ref >60)
Glucose, Bld: 142 mg/dL — ABNORMAL HIGH (ref 70–99)
Potassium: 4.1 mmol/L (ref 3.5–5.1)
Sodium: 136 mmol/L (ref 135–145)

## 2023-12-21 LAB — COOXEMETRY PANEL
Carboxyhemoglobin: 1.8 % — ABNORMAL HIGH (ref 0.5–1.5)
Methemoglobin: <0.7 % (ref 0.0–1.5)
O2 Saturation: 60.3 %
Total hemoglobin: 10.4 g/dL — ABNORMAL LOW (ref 12.0–16.0)

## 2023-12-21 LAB — CBC
HCT: 32.1 % — ABNORMAL LOW (ref 36.0–46.0)
Hemoglobin: 9.3 g/dL — ABNORMAL LOW (ref 12.0–15.0)
MCH: 22.1 pg — ABNORMAL LOW (ref 26.0–34.0)
MCHC: 29 g/dL — ABNORMAL LOW (ref 30.0–36.0)
MCV: 76.4 fL — ABNORMAL LOW (ref 80.0–100.0)
Platelets: 164 K/uL (ref 150–400)
RBC: 4.2 MIL/uL (ref 3.87–5.11)
RDW: 21.5 % — ABNORMAL HIGH (ref 11.5–15.5)
WBC: 16.1 K/uL — ABNORMAL HIGH (ref 4.0–10.5)
nRBC: 1.5 % — ABNORMAL HIGH (ref 0.0–0.2)

## 2023-12-21 LAB — MAGNESIUM: Magnesium: 2.2 mg/dL (ref 1.7–2.4)

## 2023-12-21 LAB — APTT: aPTT: 71 s — ABNORMAL HIGH (ref 24–36)

## 2023-12-21 LAB — BPAM RBC
Blood Product Expiration Date: 202511232359
ISSUE DATE / TIME: 202510291354
Unit Type and Rh: 7300

## 2023-12-21 MED ORDER — SODIUM PHOSPHATES 45 MMOLE/15ML IV SOLN
15.0000 mmol | Freq: Once | INTRAVENOUS | Status: AC
  Administered 2023-12-21: 15 mmol via INTRAVENOUS
  Filled 2023-12-21: qty 5

## 2023-12-21 MED ORDER — NOREPINEPHRINE 4 MG/250ML-% IV SOLN
0.0000 ug/min | INTRAVENOUS | Status: DC
Start: 2023-12-21 — End: 2023-12-23

## 2023-12-21 MED ORDER — INSULIN GLARGINE-YFGN 100 UNIT/ML ~~LOC~~ SOLN
10.0000 [IU] | Freq: Every day | SUBCUTANEOUS | Status: DC
  Administered 2023-12-22 – 2023-12-24 (×3): 10 [IU] via SUBCUTANEOUS
  Filled 2023-12-21 (×3): qty 0.1

## 2023-12-21 NOTE — Progress Notes (Addendum)
 NAME:  [name] [name], MRN:  [id number], DOB:  1955/02/22, LOS: 22 ADMISSION DATE:  11/29/2023, CONSULTATION DATE:  12/18/23 REFERRING MD:  Fairy Frames, CHIEF COMPLAINT:  diastolic heart failure   History of Present Illness:  67 year old female with known OSA/OHS, HFpEF, pulmonary hypertension, RV failure CKD 4, bedbound x 3 years admitted 10/8 with acute on chronic HFpEF with 2 week history of weight gain, lower extremity edema and SOB.  Her hospital course is complicated by low output state requiring milrinone infusion and lasix  infusion.  Due to ongoing challenges with continued congestion and worsening AKI despite high dose diuretics,  patient was transferred to ICU on 10/27 for Vascath placement and need for CVVHD  RHC 10/22: elevated RA Pressure with normal W.    Pertinent  Medical History  OSA/OHS, poor compliance with CPAP HFpEF Pulmonary hypertension RV failure CKD stage IV Bedbound x 3 years  Significant Hospital Events: Including procedures, antibiotic start and stop dates in addition to other pertinent events   10/8: Admitted with acute on chronic HFpEF with severe pulmonary hypertension-- started on intermittent diuresis 10/9: TTE LVEF 60-65% with RV function mildly reduced, enlarged RV  10/12: milrinone and lasix  infusions started 10/22 RHC: Elevated RA pressure with normal W, consistent with mod-severe PAH. CI 2.91 on milrinone  10/27: HD catheter placed and CRRT started. 10/29 Remains of CRRT for 1 week trial  10/30 patient remains on CRRT, on dobutamine 2.5, also receiving the midodrine  15 mg 3 times daily.   Interim History / Subjective:  Overnight-patient did have some hypotension in which Neo-Synephrine was started by elink, however also patient was getting fluid volume removal from CRRT  Patient is now off Neo-Synephrine  Coox 60.3, on dobutamine 2.5  Patient alert, pleasantly confused, but in less pain than yesterday on 10/29   Objective    Blood  pressure 135/68, pulse 99, temperature 97.8 F (36.6 C), temperature source Axillary, resp. rate 19, height (P) 5' 2 (1.575 m), weight 113.1 kg, SpO2 94%. CVP:  [10 mmHg-27 mmHg] 27 mmHg  FiO2 (%):  [36 %] 36 %   Intake/Output Summary (Last 24 hours) at 12/21/2023 1037 Last data filed at 12/21/2023 1000 Gross per 24 hour  Intake 1640.57 ml  Output 6228.38 ml  Net -4587.81 ml   Filed Weights   12/19/23 0500 12/20/23 0500 12/21/23 0500  Weight: 116.6 kg 116.4 kg 113.1 kg    Examination: General: Acute on chronic older female, critically ill, lying in ICU bed, bedbound in NAD HEENT: Normocephalic, PERRL intact-left pupil slightly larger than what right-which is baseline per RN staff, 21mm/2mm respectively CV: S1, S2, RRR, no MRG, no JVD Pulm: Clear, diminished, no distress on 4 L nasal cannula which is baseline Abs: Obese, bowel sounds active, soft Extremities: Deconditioned at baseline, weak, bedbound but moves all extremities to command Skin: Left breast cellulitis has improved still receiving treatment for, NSAID on perineum per RN staff Neuro: RASS 0, intermittent confusion, pain well-controlled GU: Deferred per patient  Resolved problem list   Assessment and Plan  Acute on chronic respiratory failure OSA/OHS P: Continue baseline 4 L nasal cannula during the day and BiPAP at night Continue scheduled bronchodilators and PRNs if needed O2 sat goal greater than 92%  Acute on chronic HFpEF Pulmonary hypertension, severe - 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  Also Neo-Synephrine, tolerating fluid removal rate at this time on CRRT  Acute on  chronic CKD stage IIIb Baseline creatinine around 2.1 Peak at 5.26 - 10/27 Cr 1.35 P: Continue to trend renal function daily  Continue to monitor and optimize electrolytes daily Continue to monitor urine output Continue strict I/Os Continue Adequate renal perfusion  Avoid nephrotoxic agents  Appreciate nephro's involvement with CRRT  Leukocytosis Left breast cellulitis (completed 7 day abx on presentation) -- WBC: 27, started up trending on 10/22, now 16.1 -- Cultures: Blood cultures 10/26 NGTD, Urine culture with multiple species.  -- Imaging: CT Abdomen and Pelvis: no acute findings, multifocal subcutaneous anterior abdominal densities felt to be injection related, colonic diverticulosis - no diverticulitis. 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  -- Empiric antibiotics started 10/25 with Zyvox /Ceftriaxone  - P: Continue Zyvox /Rocephin  for 7 days Continue to trend WBC, fever curve-WBC trending down  Thrombocytopenia Anemia Plt WNL 9.3 P: Continue to monitor CBC-H/H, monitor platelets Monitor for signs of bleeding Patient is on CRRT and has clotted in the past without being able to give blood back from circuit, potential blood loss, but currently hemoglobin 9.3 and stable  DM type II P: Continue SSI Decreasing semeglee Continue ACHS monitoring for CBGs  Metabolic encephalopathy -- likely multifactorial with uremia, prolonged hospital course -- [facility] 10/21 without acute process -- ammonia level normal  P: Continue delirium precautions Continue to limit sedating medications Continue CRRT for uremia removal  Decreased mobility at baseline Hx of pressure injuries- during this admission  MSAD  P: PT/OT as tolerated Every 2 hours turns Daily skin assessments per shift Appreciate wound care's input  GOC:  Plan for one week trial of CRRT, not a HD candidate Palliative discussing with family in  regards to code status, as well as GOC 10/30-family requesting to change palliative care meeting from today to Saturday on November 1, family still very hopeful of improvement P: Continue goals of care discussions Plan is still for 1 week trial of CRRT, patient not an HD candidate  Labs   CBC: Recent Labs  Lab 12/17/23 0455 12/18/23 0230 12/18/23 1008 12/19/23 0430 12/20/23 0439 12/21/23 0451  WBC 22.1* 23.7*  --  27.7* 20.1* 16.1*  HGB 8.1* 7.7*  --  8.4* 7.7* 9.3*  HCT 27.7* 27.1*  --  28.9* 27.3* 32.1*  MCV 74.3* 75.9*  --  74.7* 76.5* 76.4*  PLT 106* 136* 134* 155 158 164    Basic Metabolic Panel: Recent Labs  Lab 12/18/23 0230 12/18/23 0230 12/19/23 0430 12/19/23 1603 12/20/23 0439 12/20/23 1624 12/20/23 2023 12/21/23 0451  NA 132*  --  133* 138 133* 136  --  136  135  K 3.7  --  3.4* 4.0 4.5 4.6  --  4.1  4.1  CL 88*  --  92* 97* 97* 99  --  97*  96*  CO2 29  --  25 27 25 25   --  25  25  GLUCOSE 181*  --  147* 159* 172* 165*  --  142*  140*  BUN 116*  --  74* 45* 25* 16  --  8  8  CREATININE 5.26*  --  3.59* 2.42* 1.75* 1.48*  --  1.32*  1.35*  CALCIUM  9.1  --  9.0 8.6* 9.1  8.3*  --  9.1  9.0  MG 1.5*  --  1.8  --  2.4  --   --  2.2  PHOS  --    < > 2.8 1.7* 1.5* 3.5 2.7 2.4*   < > = values in this interval not displayed.   GFR: Estimated Creatinine Clearance: 47.6 mL/min (A) (by C-G formula based on SCr of 1.32 mg/dL (H)). Recent Labs  Lab 12/17/23 0455 12/17/23 1125 12/17/23 1537 12/18/23 0230 12/19/23 0430 12/20/23 0439 12/21/23 0451  PROCALCITON 0.41  --   --  1.00  --   --   --   WBC 22.1*  --   --  23.7* 27.7* 20.1* 16.1*  LATICACIDVEN  --  1.1 1.2  --   --   --   --     Liver Function Tests: Recent Labs  Lab 12/19/23 0430 12/19/23 1603 12/20/23 0439 12/20/23 1624 12/21/23 0451  ALBUMIN  2.9* 2.6* 2.7* 2.5* 2.7*   No results for input(s): LIPASE, AMYLASE in the last 168 hours. Recent Labs  Lab 12/18/23 1428  AMMONIA 29     ABG    Component Value Date/Time   PHART 7.39 12/05/2023 0752   PCO2ART 58 (H) 12/05/2023 0752   PO2ART <31 (LL) 12/05/2023 0752   HCO3 38.2 (H) 12/13/2023 1523   TCO2 40 (H) 12/13/2023 1523   ACIDBASEDEF 1.6 03/27/2023 2240   O2SAT 60.3 12/21/2023 0451     Coagulation Profile: Recent Labs  Lab 12/18/23 1008  INR 1.2    Cardiac Enzymes: No results for input(s): CKTOTAL, CKMB, CKMBINDEX, TROPONINI in the last 168 hours.  HbA1C: Hgb A1c MFr Bld  Date/Time Value Ref Range Status  11/29/2023 07:02 PM 8.8 (H) 4.8 - 5.6 % Final    Comment:    (NOTE) Diagnosis of Diabetes The following HbA1c ranges recommended by the American Diabetes Association (ADA) may be used as an aid in the diagnosis of diabetes mellitus.  Hemoglobin             Suggested A1C NGSP%              Diagnosis  <5.7                   Non Diabetic  5.7-6.4                Pre-Diabetic  >6.4                   Diabetic  <7.0                   Glycemic control for                       adults with diabetes.    01/15/2023 03:57 AM 9.2 (H) 4.8 - 5.6 % Final    Comment:    (NOTE) Pre diabetes:          5.7%-6.4%  Diabetes:              >6.4%  Glycemic control for   <7.0% adults with diabetes     CBG: Recent Labs  Lab 12/20/23 0603 12/20/23 1125 12/20/23 1604 12/20/23 2111 12/21/23 0607  GLUCAP 158* 160* 163* 120* 109*    Critical care time: 45 min    Christian Geneal Huebert AGACNP-BC   [facility] 12/21/2023, 10:58 AM  Please see [email or web address] for pager details.  From 7A-7P if no response, please call [phone].  After hours, please call ELink [phone].

## 2023-12-21 NOTE — Progress Notes (Signed)
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    ceFAZolin  (ANCEF ) IVPB 2g/100 mL premix    dextrose  5 %-0.9 % sodium chloride  infusion    potassium chloride  SA (KLOR-CON  M) CR tablet 20 mEq    methylPREDNISolone  sodium succinate (SOLU-MEDROL ) 125 mg/2 mL injection 60 mg     insulin  glargine-yfgn (SEMGLEE ) injection 10 Units    senna-docusate (Senokot-S) tablet 1 tablet    ipratropium-albuterol  (DUONEB) 0.5-2.5 (3) MG/3ML nebulizer solution 3 mL    insulin  glargine-yfgn (SEMGLEE ) injection 20 Units    rOPINIRole (REQUIP) tablet 0.25 mg    furosemide  (LASIX ) 200 mg in dextrose  5 % 100 mL (2 mg/mL) infusion    midodrine  (PROAMATINE ) tablet 5 mg    insulin  glargine-yfgn (SEMGLEE ) injection 25 Units    metolazone (ZAROXOLYN) tablet 5 mg    furosemide  (LASIX ) injection 80 mg    furosemide  (LASIX ) injection 80 mg    furosemide  (LASIX ) injection 80 mg    lidocaine  (PF) (XYLOCAINE ) 1 % injection Patient Transfer   Heparin  (Porcine) in NaCl 1000-0.9 UT/500ML-% SOLN Patient Transfer   furosemide  (LASIX ) 200 mg in dextrose  5 % 100 mL (2 mg/mL) infusion    potassium chloride  SA (KLOR-CON  M) CR tablet 40 mEq    methylPREDNISolone  sodium succinate (SOLU-MEDROL ) 125 mg/2 mL injection 60 mg    predniSONE (DELTASONE) tablet 20 mg    hydrOXYzine  (ATARAX ) tablet 25 mg    hydrALAZINE  (APRESOLINE ) injection 10 mg    oxyCODONE  (Oxy IR/ROXICODONE ) immediate release tablet 5 mg    heparin  injection 5,000 Units    pregabalin  (LYRICA ) capsule 75 mg    midodrine  (PROAMATINE ) tablet 5 mg    insulin  aspart (novoLOG ) injection 3 Units    insulin  glargine-yfgn (SEMGLEE ) injection 35 Units    oxyCODONE  (Oxy IR/ROXICODONE ) immediate release tablet 10 mg    milrinone (PRIMACOR) 20 MG/100 ML (0.2 mg/mL) infusion    liver oil-zinc  oxide (DESITIN) 40 % ointment    phenylephrine  (NEO-SYNEPHRINE) 20mg /NS premix infusion    phenylephrine  CONCENTRATED 100mg  in sodium chloride  0.9% (0.4mg /mL) premix infusion    midodrine  (PROAMATINE ) tablet 10 mg    cefTRIAXone  (ROCEPHIN ) 2 g in sodium chloride  0.9 % 100 mL IVPB    linezolid  (ZYVOX ) IVPB 600 mg    midodrine  (PROAMATINE ) tablet 15 mg    phenylephrine  (NEO-SYNEPHRINE) 20mg /NS premix infusion    phenylephrine  CONCENTRATED  100mg   in sodium chloride  0.9% (0.4mg /mL) premix infusion     Social History:  reports that she has quit smoking. She has never used smokeless tobacco. She reports current alcohol  use. She reports that she does not use drugs.  Family History:   Family History  Problem Relation Age of Onset   Diabetes Mother     Blood pressure 126/60, pulse 95, temperature 97.8 F (36.6 C), temperature source Axillary, resp. rate (!) 21, height (P) 5' 2 (1.575 m), weight 113.1 kg, SpO2 94%. Physical Exam: General appearance: alert, cooperative, and morbidly obese Head: NCAT Eyes: negative Neck: no adenopathy, no carotid bruit Resp: distant BS Cardio: regular rate and rhythm GI: SNDNT +BS Extremities: edema 1+ Pulses: 2+ and symmetric     Dantae Meunier, LYNWOOD ORN, MD 12/21/2023, 9:39 AM

## 2023-12-21 NOTE — Progress Notes (Signed)
 Daily Progress Note   Patient Name: [name] [name]       Date: 12/21/2023 DOB: 02-23-1955  Age: 68 y.o. MRN#: [id number] Attending Physician: Gatha Pence, MD Primary Care Physician: [facility] Admit Date: 11/29/2023  Reason for Consultation/Follow-up: Establishing goals of care  Subjective: Medical records reviewed including progress notes, labs and imaging. Patient assessed at the bedside. Denies pain or distress. Patient's son is present visiting. Discussed with RN. Per MAR, she has received 4 doses of PRN oxycodone  and 3 doses of PRN fentanyl  in the past 24 hours.  Son [name] [name] shares that he would like to reschedule today's family meeting to Saturday at 11am, when patient's 2 sisters can be present for additional support. They are still praying for improvement, though he understands importance of planning for the worst at the same time as hoping for the best. He feels code status discussions will go better with additional family here. He understands that CRRT will continue until Tuesday 11/4 for a total of 7 full days. I informed [name] [name] that I would ask my colleagues for their availability at his preferred time and reach out on Saturday to confirm with him if this works.  Questions and concerns addressed. PMT will continue to support holistically.   Length of Stay: 22   Physical Exam Vitals and nursing note reviewed.  Constitutional:      General: She is not in acute distress.    Appearance: She is ill-appearing.  HENT:     Head: Normocephalic and atraumatic.  Cardiovascular:     Rate and Rhythm: Normal rate.  Pulmonary:     Effort: Pulmonary effort is normal.  Neurological:     Mental Status: She is alert.  Psychiatric:        Mood and Affect: Mood normal.        Behavior: Behavior normal.            Vital  Signs: BP 131/72   Pulse 96   Temp 97.8 F (36.6 C) (Axillary)   Resp 18   Ht (P) 5' 2 (1.575 m)   Wt 113.1 kg   SpO2 96%   BMI (P) 45.60 kg/m  SpO2: SpO2: 96 % O2 Device: O2 Device: Nasal Cannula O2 Flow Rate: O2 Flow Rate (L/min): 4 L/min      Palliative Assessment/Data: 30% at best   Palliative Care Assessment & Plan   Patient Profile: 68 y.o. female  with past medical history of  insulin -dependent type 2 diabetes, chronic heart failure with preserved EF, morbid obesity, bedbound, pulmonary hypertension admitted on 11/29/2023 with edema and weight gain.  Patient was admitted for acute exacerbation of chronic heart failure with preserved EF.  Her hospital course is complicated by low output state requiring milrinone infusion and lasix  infusion, as well as worsening AKI with Cr of 5.26 increased today from 4.3. Requiring transfer to ICU for CRRT. PMT has been consulted to assist with goals of care conversation.  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  Patient's family is concerned that she is acting confused to avoid difficult conversations PMT will see again Saturday 11/1 at 11am for scheduled family meeting   Prognosis: Very poor  Discharge Planning: To Be Determined   Care plan was discussed with patient, patient's son, RN         [name] [name] [name] Fell, PA-C  Palliative Medicine Team Team phone # [phone]  Thank you for allowing the Palliative Medicine Team to assist in the care of this  patient. Please utilize secure chat with additional questions, if there is no response within 30 minutes please call the above phone number.  Palliative Medicine Team providers are available by phone from 7am to 7pm daily and can be reached through the team cell phone.  Should this patient require assistance outside of these hours, please call the patient's attending physician.    Time Total: 35  Visit consisted of counseling and education dealing with the complex and emotionally intense issues of symptom management and palliative care in the setting of serious and potentially life-threatening illness. Greater than 50% of this time was spent counseling and coordinating care related to the above assessment and plan.  Personally spent 35 minutes in patient care including extensive chart review (labs, imaging, progress/consult notes, vital signs), medically appropraite exam, discussed with treatment team, education to patient, family, and staff, documenting clinical information, medication review and management, coordination of care, and available advanced directive documents.

## 2023-12-21 NOTE — Progress Notes (Addendum)
 Patient ID: [name] [name], female   DOB: 30-Nov-1955, 68 y.o.   MRN: [id number]    Cardiologist: Jerel Balding, MD  Chief Complaint: Acute renal failure Subjective:    Co-ox 60% on DBA 2.5 with midodrine  15.  Again restarted on Neo by [facility] overnight. Now off. Net -4L on CRRT. CVP 13 Afebrile. WBC 28>20>16 On linezolid  + ceftriaxone .   Lying in bed. Family at bedside. Feeling well, no SOB.   Objective:    Weight Range: 113.1 kg Body mass index is 45.6 kg/m (pended).   Vital Signs:   Temp:  [97.7 F (36.5 C)-98.5 F (36.9 C)] 97.8 F (36.6 C) (10/30 0315) Pulse Rate:  [81-109] 95 (10/30 0915) Resp:  [8-32] 21 (10/30 0915) BP: (70-160)/(37-92) 126/60 (10/30 0915) SpO2:  [94 %-100 %] 94 % (10/30 0915) FiO2 (%):  [36 %] 36 % (10/30 0822) Weight:  [113.1 kg] 113.1 kg (10/30 0500) Last BM Date : 12/21/23  Weight change: Filed Weights   12/19/23 0500 12/20/23 0500 12/21/23 0500  Weight: 116.6 kg 116.4 kg 113.1 kg   Intake/Output:  Intake/Output Summary (Last 24 hours) at 12/21/2023 0940 Last data filed at 12/21/2023 0900 Gross per 24 hour  Intake 1861.22 ml  Output 6161.9 ml  Net -4300.68 ml   Physical Exam    General: Sedentary appearing. No distress on [place] Cardiac: S1 and S2 present. No murmurs Abdomen: Soft, non-tender, non-distended.  Extremities: Warm and dry.  1+ BLE edema.  Neuro: Alert and oriented x3. Affect pleasant  Telemetry   SR 90-100s (personally reviewed)  Labs    CBC Recent Labs    12/20/23 0439 12/21/23 0451  WBC 20.1* 16.1*  HGB 7.7* 9.3*  HCT 27.3* 32.1*  MCV 76.5* 76.4*  PLT 158 164   Basic Metabolic Panel Recent Labs    89/70/74 0439 12/20/23 1624 12/20/23 2023 12/21/23 0451  NA 133* 136  --  136  135  K 4.5 4.6  --  4.1  4.1  CL 97* 99  --  97*  96*  CO2 25 25  --  25  25  GLUCOSE 172* 165*  --  142*  140*  BUN 25* 16  --  8  8  CREATININE 1.75* 1.48*  --  1.32*  1.35*  CALCIUM  9.1 8.3*  --  9.1  9.0   MG 2.4  --   --  2.2  PHOS 1.5* 3.5 2.7 2.4*   Liver Function Tests Recent Labs    12/20/23 1624 12/21/23 0451  ALBUMIN  2.5* 2.7*   BNP (last 3 results) Recent Labs    01/13/23 1811 02/15/23 2105 03/27/23 1622  BNP 334.2* 432.5* 462.5*   ProBNP (last 3 results) Recent Labs    11/29/23 1531 11/30/23 0300 12/11/23 1114  PROBNP 1,820.0* 1,624.0* 5,609.0*   D-Dimer Recent Labs    12/18/23 1008  DDIMER 0.59*   Medications:    Scheduled Medications:  (feeding supplement) PROSource Plus  30 mL Oral BID BM   acidophilus  2 capsule Oral TID   allopurinol   100 mg Oral QHS   arformoterol  15 mcg Nebulization BID   atorvastatin   10 mg Oral QHS   budesonide (PULMICORT) nebulizer solution  0.5 mg Nebulization BID   calcitRIOL  0.25 mcg Oral Q M,W,F   capsicum   Topical BID   Chlorhexidine  Gluconate Cloth  6 each Topical Daily   diclofenac Sodium  2 g Topical QID   feeding supplement  237 mL Oral Daily  fluticasone  2 spray Each Nare Daily   Gerhardt's butt cream   Topical BID   heparin  injection (subcutaneous)  5,000 Units Subcutaneous Q8H   insulin  aspart  0-20 Units Subcutaneous TID WC   insulin  aspart  0-5 Units Subcutaneous QHS   insulin  glargine-yfgn  25 Units Subcutaneous Daily   ipratropium-albuterol   3 mL Nebulization BID   loratadine   10 mg Oral Daily   midodrine   15 mg Oral Q8H   multivitamin  1 tablet Oral QHS   nystatin  cream  1 Application Topical BID   pantoprazole   40 mg Oral QAC breakfast   polycarbophil  625 mg Oral Daily   polyethylene glycol  17 g Oral Daily   pregabalin   50 mg Oral Daily   sertraline  50 mg Oral q AM   sodium chloride  flush  10-40 mL Intracatheter Q12H   triamcinolone  1 Application Topical Daily    Infusions:  cefTRIAXone  (ROCEPHIN )  IV     DOBUTamine 2.5 mcg/kg/min (12/21/23 0900)   heparin  10,000 units/ 20 mL infusion syringe 750 Units/hr (12/21/23 0800)   linezolid  (ZYVOX ) IV Stopped (12/20/23 2202)   prismasol BGK  4/2.5 500 mL/hr at 12/21/23 0443   prismasol BGK 4/2.5 1,500 mL/hr at 12/21/23 0443   prismasol BGK 4/2.5 400 mL/hr at 12/20/23 1935   sodium PHOSPHATE IVPB (in mmol) 43 mL/hr at 12/21/23 0900    PRN Medications: acetaminophen  **OR** acetaminophen , albuterol , camphor-menthol , fentaNYL  (SUBLIMAZE ) injection, heparin , methocarbamol, naphazoline-pheniramine, nystatin , ondansetron  **OR** ondansetron  (ZOFRAN ) IV, mouth rinse, oxyCODONE , sodium chloride , sodium chloride  flush, traZODone   Assessment/Plan   1. 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  Echo this admission with EF 60-65%, mild RV dilation/mild RV dysfunction.  She was started on milrinone for RV support and diuresed extensively.  RHC 10/22 showed that she still had significantly elevated right-sided filling pressures but with PCWP only 12.  Lasix  gtt at 15 mg/hr started.  This was stopped with rise in creatinine. She was switched to DBA with renal function. Started on CRRT 10/27.  - CVP 13. - On CRRT for volume removal UF 100-150 ml/hr - continue midodrine  to 15 mg tid - NE PRN for hypotension overnight (avoid Neo) - D/w CCM and Palliative, will trial 1 week of CVVHD, with hope for renal recover. She would not be a good HD candidate. 2. AKI on CKD stage IV: Worry for cardiorenal syndrome => ATN.  Creatinine up to 5.26 today with minimal UOP.  RV failure initially but now worry about an additional cause for low BP (sepsis, anemia).  - nephrology following, plan as above 3. Pulmonary hypertension: RHC showed severe PAH.  She had a V/Q scan earlier this year with no sign of chronic PE.  She is on home oxygen  and Bipap at night. 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  - Suspect that she would not get much benefit from pulmonary vasodilators.   - Continue oxygen  during the day, Bipap at night.  - Sent  rheumatalogic serologies (?WHO group 1 PH component). Negative ANA. Neg RF, Neg scl 70, neg centroeme ab. 4. ID: Concern for infection, ?UTI, with sepsis syndrome.  WBCs 28>20k today. Urine culture with contaminant, now anuric. BCX no growth to date. PCT 1.0 10/27, prior to CRRT (renal dysfunction). - WBC improving on linezolid /ceftriaxone . Down to 16k 5. Anemia: Hgb 7.7>9.3.  No overt bleeding.  ?Etiology. 1u RBC 10/29.  tsat 9. - PPI  Social/Dispo: Long discussion with palliative care team and CCM yesterday. Agreed to trial 1 week of CVVHD inpatient. Not a candidate for outpatient HD with end-stage RV failure. This was discussed with patient and her family. Full code at this time. Continue GOC discussions.  Length of Stay: 32  Jordan Lee, NP  12/21/2023, 9:40 AM  Advanced Heart Failure Team Pager [phone] (M-F; 7a - 5p)  Please contact [facility] for night-coverage after hours (4p -7a ) and weekends on [email or web address]  Agree with above.   Remains on DBA. Midodrine  and CRRT. Weight down 22 pounds.   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  Weak.   General:  Obese woman lying in bed. No resp difficulty HEENT: normal Neck: supple. RIJ HD cath Cor: Regular rate & rhythm. No rubs, gallops or murmurs. Lungs: clear Abdomen: obese soft, nontender, nondistended.Good bowel sounds. Extremities: no cyanosis, clubbing, rash, edema Neuro: alert & orientedx3, cranial nerves grossly intact. moves all 4 extremities w/o difficulty. Affect uncomfortable  She is end-stage. Good volume removal with CVVHD but no evidence of renal recovery.   Await CRRT stop date per Renal (tentatively 11/4). If no renal recovery will need comfort care as she is not long-term HD candidate.   Palliative following.   CRITICAL CARE Performed by: Cherrie Sieving  Total critical care time: 35 minutes  Critical care time was exclusive of separately billable procedures and treating other  patients.  Critical care was necessary to treat or prevent imminent or life-threatening deterioration.  Critical care was time spent personally by me (independent of midlevel providers or residents) on the following activities: development of treatment plan with patient and/or surrogate as well as nursing, discussions with consultants, evaluation of patient's response to treatment, examination of patient, obtaining history from patient or surrogate, ordering and performing treatments and interventions, ordering and review of laboratory studies, ordering and review of radiographic studies, pulse oximetry and re-evaluation of patient's condition.  Sieving Cherrie, MD  2:42 PM

## 2023-12-22 DIAGNOSIS — G4733 Obstructive sleep apnea (adult) (pediatric): Secondary | ICD-10-CM | POA: Diagnosis not present

## 2023-12-22 DIAGNOSIS — J9601 Acute respiratory failure with hypoxia: Secondary | ICD-10-CM | POA: Diagnosis not present

## 2023-12-22 DIAGNOSIS — J9621 Acute and chronic respiratory failure with hypoxia: Secondary | ICD-10-CM

## 2023-12-22 DIAGNOSIS — N1832 Chronic kidney disease, stage 3b: Secondary | ICD-10-CM | POA: Diagnosis not present

## 2023-12-22 DIAGNOSIS — N179 Acute kidney failure, unspecified: Secondary | ICD-10-CM | POA: Diagnosis not present

## 2023-12-22 LAB — CBC
HCT: 35.4 % — ABNORMAL LOW (ref 36.0–46.0)
Hemoglobin: 10.1 g/dL — ABNORMAL LOW (ref 12.0–15.0)
MCH: 22 pg — ABNORMAL LOW (ref 26.0–34.0)
MCHC: 28.5 g/dL — ABNORMAL LOW (ref 30.0–36.0)
MCV: 77 fL — ABNORMAL LOW (ref 80.0–100.0)
Platelets: 156 K/uL (ref 150–400)
RBC: 4.6 MIL/uL (ref 3.87–5.11)
RDW: 21.8 % — ABNORMAL HIGH (ref 11.5–15.5)
WBC: 19.7 K/uL — ABNORMAL HIGH (ref 4.0–10.5)
nRBC: 1.1 % — ABNORMAL HIGH (ref 0.0–0.2)

## 2023-12-22 LAB — CULTURE, BLOOD (ROUTINE X 2)
Culture: NO GROWTH
Culture: NO GROWTH

## 2023-12-22 LAB — RENAL FUNCTION PANEL
Albumin: 2.8 g/dL — ABNORMAL LOW (ref 3.5–5.0)
Albumin: 2.7 g/dL — ABNORMAL LOW (ref 3.5–5.0)
Anion gap: 16 — ABNORMAL HIGH (ref 5–15)
Anion gap: 16 — ABNORMAL HIGH (ref 5–15)
BUN: 8 mg/dL (ref 8–23)
BUN: 8 mg/dL (ref 8–23)
CO2: 23 mmol/L (ref 22–32)
CO2: 24 mmol/L (ref 22–32)
Calcium: 9.4 mg/dL (ref 8.9–10.3)
Calcium: 9 mg/dL (ref 8.9–10.3)
Chloride: 94 mmol/L — ABNORMAL LOW (ref 98–111)
Chloride: 98 mmol/L (ref 98–111)
Creatinine, Ser: 1.32 mg/dL — ABNORMAL HIGH (ref 0.44–1.00)
Creatinine, Ser: 1.32 mg/dL — ABNORMAL HIGH (ref 0.44–1.00)
GFR, Estimated: 43 mL/min — ABNORMAL LOW (ref >60)
GFR, Estimated: 43 mL/min — ABNORMAL LOW (ref >60)
Glucose, Bld: 108 mg/dL — ABNORMAL HIGH (ref 70–99)
Glucose, Bld: 109 mg/dL — ABNORMAL HIGH (ref 70–99)
Phosphorus: 2 mg/dL — ABNORMAL LOW (ref 2.5–4.6)
Phosphorus: 2.4 mg/dL — ABNORMAL LOW (ref 2.5–4.6)
Potassium: 4.3 mmol/L (ref 3.5–5.1)
Potassium: 4.2 mmol/L (ref 3.5–5.1)
Sodium: 133 mmol/L — ABNORMAL LOW (ref 135–145)
Sodium: 138 mmol/L (ref 135–145)

## 2023-12-22 LAB — COOXEMETRY PANEL
Carboxyhemoglobin: 2.2 % — ABNORMAL HIGH (ref 0.5–1.5)
Methemoglobin: <0.7 % (ref 0.0–1.5)
O2 Saturation: 78.4 %
Total hemoglobin: 10.7 g/dL — ABNORMAL LOW (ref 12.0–16.0)

## 2023-12-22 LAB — GLUCOSE, CAPILLARY
Glucose-Capillary: 120 mg/dL — ABNORMAL HIGH (ref 70–99)
Glucose-Capillary: 167 mg/dL — ABNORMAL HIGH (ref 70–99)
Glucose-Capillary: 112 mg/dL — ABNORMAL HIGH (ref 70–99)
Glucose-Capillary: 127 mg/dL — ABNORMAL HIGH (ref 70–99)

## 2023-12-22 LAB — MAGNESIUM: Magnesium: 2.5 mg/dL — ABNORMAL HIGH (ref 1.7–2.4)

## 2023-12-22 LAB — APTT: aPTT: 131 s — ABNORMAL HIGH (ref 24–36)

## 2023-12-22 MED ORDER — SODIUM PHOSPHATES 45 MMOLE/15ML IV SOLN
15.0000 mmol | Freq: Once | INTRAVENOUS | Status: AC
  Administered 2023-12-22: 15 mmol via INTRAVENOUS
  Filled 2023-12-22: qty 5

## 2023-12-22 MED ORDER — FAMOTIDINE 20 MG PO TABS
20.0000 mg | ORAL_TABLET | Freq: Every day | ORAL | Status: DC
Start: 2023-12-22 — End: 2023-12-24
  Administered 2023-12-22 – 2023-12-24 (×3): 20 mg via ORAL
  Filled 2023-12-22 (×3): qty 1

## 2023-12-22 NOTE — Progress Notes (Addendum)
 Patient ID: [name] [name], female   DOB: 1955/07/01, 68 y.o.   MRN: [id number]    Cardiologist: Jerel Balding, MD  Chief Complaint: Acute renal failure Subjective:   On CRRT- pulling 150. CVP down to 2   On DBA 2.5 mcg.  CO-OX 78%  On linezolid  + ceftriaxone .    Poor appetite. Denies SOB.  Objective:    Weight Range: 101.9 kg Body mass index is 41.09 kg/m (pended).   Vital Signs:   Temp:  [98.1 F (36.7 C)-98.5 F (36.9 C)] 98.4 F (36.9 C) (10/30 2334) Pulse Rate:  [92-104] 100 (10/31 0600) Resp:  [14-28] 19 (10/31 0600) BP: (111-160)/(54-139) 123/56 (10/31 0600) SpO2:  [90 %-100 %] 95 % (10/31 0600) FiO2 (%):  [36 %] 36 % (10/30 2340) Weight:  [101.9 kg] 101.9 kg (10/31 0438) Last BM Date : 12/21/23  Weight change: Filed Weights   12/20/23 0500 12/21/23 0500 12/22/23 0438  Weight: 116.4 kg 113.1 kg 101.9 kg   Intake/Output:  Intake/Output Summary (Last 24 hours) at 12/22/2023 0742 Last data filed at 12/22/2023 0700 Gross per 24 hour  Intake 1086.27 ml  Output 4670.88 ml  Net -3584.61 ml  CVP down 2-3 Physical Exam    General:   No resp difficulty. On CRRT Neck: no JVD. RIJ Cor: Regular rate & rhythm.  Lungs: clear Abdomen: soft, nontender, nondistended.  Extremities: no  edema Neuro: alert & oriented x3   Telemetry   SR 90s   Labs    CBC Recent Labs    12/20/23 0439 12/21/23 0451  WBC 20.1* 16.1*  HGB 7.7* 9.3*  HCT 27.3* 32.1*  MCV 76.5* 76.4*  PLT 158 164   Basic Metabolic Panel Recent Labs    89/69/74 0451 12/21/23 1600 12/22/23 0433  NA 136  135 134* 133*  K 4.1  4.1 4.5 4.3  CL 97*  96* 96* 94*  CO2 25  25 24 23   GLUCOSE 142*  140* 141* 108*  BUN 8  8 8 8   CREATININE 1.32*  1.35* 1.43* 1.32*  CALCIUM  9.1  9.0 9.0 9.4  MG 2.2  --  2.5*  PHOS 2.4* 2.7 2.0*   Liver Function Tests Recent Labs    12/21/23 1600 12/22/23 0433  ALBUMIN  2.9* 2.8*   BNP (last 3 results) Recent Labs    01/13/23 1811  02/15/23 2105 03/27/23 1622  BNP 334.2* 432.5* 462.5*   ProBNP (last 3 results) Recent Labs    11/29/23 1531 11/30/23 0300 12/11/23 1114  PROBNP 1,820.0* 1,624.0* 5,609.0*   D-Dimer No results for input(s): DDIMER in the last 72 hours.  Medications:    Scheduled Medications:  (feeding supplement) PROSource Plus  30 mL Oral BID BM   acidophilus  2 capsule Oral TID   allopurinol   100 mg Oral QHS   arformoterol  15 mcg Nebulization BID   atorvastatin   10 mg Oral QHS   budesonide (PULMICORT) nebulizer solution  0.5 mg Nebulization BID   calcitRIOL  0.25 mcg Oral Q M,W,F   capsicum   Topical BID   Chlorhexidine  Gluconate Cloth  6 each Topical Daily   diclofenac Sodium  2 g Topical QID   feeding supplement  237 mL Oral Daily   fluticasone  2 spray Each Nare Daily   Gerhardt's butt cream   Topical BID   heparin  injection (subcutaneous)  5,000 Units Subcutaneous Q8H   insulin  aspart  0-20 Units Subcutaneous TID WC   insulin  aspart  0-5 Units Subcutaneous QHS  insulin  glargine-yfgn  10 Units Subcutaneous Daily   ipratropium-albuterol   3 mL Nebulization BID   loratadine   10 mg Oral Daily   midodrine   15 mg Oral Q8H   multivitamin  1 tablet Oral QHS   nystatin  cream  1 Application Topical BID   pantoprazole   40 mg Oral QAC breakfast   polycarbophil  625 mg Oral Daily   polyethylene glycol  17 g Oral Daily   pregabalin   50 mg Oral Daily   sertraline  50 mg Oral q AM   sodium chloride  flush  10-40 mL Intracatheter Q12H   triamcinolone  1 Application Topical Daily    Infusions:  cefTRIAXone  (ROCEPHIN )  IV Stopped (12/21/23 1243)   DOBUTamine 2.5 mcg/kg/min (12/22/23 0700)   heparin  10,000 units/ 20 mL infusion syringe 1,000 Units/hr (12/22/23 0648)   linezolid  (ZYVOX ) IV Stopped (12/21/23 2224)   norepinephrine  (LEVOPHED ) Adult infusion     prismasol BGK 4/2.5 500 mL/hr at 12/22/23 0132   prismasol BGK 4/2.5 1,500 mL/hr at 12/22/23 0132   prismasol BGK 4/2.5 400 mL/hr  at 12/22/23 0605    PRN Medications: acetaminophen  **OR** acetaminophen , albuterol , camphor-menthol , fentaNYL  (SUBLIMAZE ) injection, heparin , methocarbamol, naphazoline-pheniramine, nystatin , ondansetron  **OR** ondansetron  (ZOFRAN ) IV, mouth rinse, oxyCODONE , sodium chloride , sodium chloride  flush, traZODone   Assessment/Plan   1. 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  Echo this admission with EF 60-65%, mild RV dilation/mild RV dysfunction.  She was started on milrinone for RV support and diuresed extensively.  RHC 10/22 showed that she still had significantly elevated right-sided filling pressures but with PCWP only 12.  Lasix  gtt at 15 mg/hr started.  This was stopped with rise in creatinine. She was switched to DBA with renal function. Started on CRRT 10/27.  -- On CRRT  150 per hour. Continue DBA 2.5 mcg. CO-OX 78%  - continue midodrine  to 15 mg tid - - D/w CCM and Palliative, will trial 1 week of CVVHD, with hope for renal recover. She would not be a good HD candidate. 2. AKI on CKD stage IV: Worry for cardiorenal syndrome => ATN.   On CRRT. CVP down to 2-3  - nephrology following, plan as above 3. Pulmonary hypertension: RHC showed severe PAH.  She had a V/Q scan earlier this year with no sign of chronic PE.  She is on home oxygen  and Bipap at night. 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  - Suspect that she would not get much benefit from pulmonary vasodilators.   - Continue oxygen  during the day, Bipap at night.  - ?WHO group 1 PH component). Negative ANA. Neg RF, Neg scl 70, neg centroeme ab. 4. ID: Concern for infection, ?UTI, with sepsis syndrome.  WBCs 28>20k today. Urine culture with contaminant, now anuric. BCX no growth to date. PCT 1.0 10/27, prior to CRRT (renal dysfunction). - Check CBC 5. Anemia:  Check CBC  Social/Dispo: Ongoing GOC  discussions with palliative care team +  CCM. Trial 1 week of CVVHD inpatient. Not a candidate for outpatient HD with end-stage RV failure. This was discussed with patient and her family. Full code at this time. Continue GOC discussions.  Length of Stay: 23  Greig Mosses, NP  12/22/2023, 7:42 AM  Advanced Heart Failure Team Pager [phone] (M-F; 7a - 5p)  Please contact [facility] for night-coverage after hours (4p -7a ) and weekends on [email or web address]  Agree with above.  She remains on DBA and CRRT.   She is now euvolemic. Remains anuric. Feels miserable,   General:  Lying flat in bed  No resp difficulty HEENT: normal Neck: supple. RIJ HD cath  Cor: Regular rate & rhythm. No rubs, gallops or murmurs. Lungs: clear Abdomen: obese  soft, nontender, nondistended.Good bowel sounds. Extremities: no cyanosis, clubbing, rash, edema Neuro: alert & orientedx3, cranial nerves grossly intact. moves all 4 extremities w/o difficulty. Affect pleasant  HF team has little left to offer at this point. She is now euvolemic. Would recommend stopping CVVHD. Continue DBA for support. IF renal function not recovering will need Comfort Care,   HF team will s/o Please reconsult as needed.   CRITICAL CARE Performed by: Cherrie Sieving  Total critical care time: 40 minutes  Critical care time was exclusive of separately billable procedures and treating other patients.  Critical care was necessary to treat or prevent imminent or life-threatening deterioration.  Critical care was time spent personally by me (independent of midlevel providers or residents) on the following activities: development of treatment plan with patient and/or surrogate as well as nursing, discussions with consultants, evaluation of patient's response to treatment, examination of patient, obtaining history from patient or surrogate, ordering and performing treatments and interventions, ordering and review of laboratory studies,  ordering and review of radiographic studies, pulse oximetry and re-evaluation of patient's condition.  Sieving Cherrie, MD  12:43 PM

## 2023-12-22 NOTE — Progress Notes (Signed)
 NAME:  [name] [name], MRN:  [id number], DOB:  08-09-1955, LOS: 23 ADMISSION DATE:  11/29/2023, CONSULTATION DATE:  12/18/23 REFERRING MD:  Fairy Frames, CHIEF COMPLAINT:  diastolic heart failure   History of Present Illness:  68 year old female with known OSA/OHS, HFpEF, pulmonary hypertension, RV failure CKD 4, bedbound x 3 years admitted 10/8 with acute on chronic HFpEF with 2 week history of weight gain, lower extremity edema and SOB.  Her hospital course is complicated by low output state requiring milrinone infusion and lasix  infusion.  Due to ongoing challenges with continued congestion and worsening AKI despite high dose diuretics,  patient was transferred to ICU on 10/27 for Vascath placement and need for CVVHD  RHC 10/22: elevated RA Pressure with normal W.    Pertinent  Medical History  OSA/OHS, poor compliance with CPAP HFpEF Pulmonary hypertension RV failure CKD stage IV Bedbound x 3 years  Significant Hospital Events: Including procedures, antibiotic start and stop dates in addition to other pertinent events   10/8: Admitted with acute on chronic HFpEF with severe pulmonary hypertension-- started on intermittent diuresis 10/9: TTE LVEF 60-65% with RV function mildly reduced, enlarged RV  10/12: milrinone and lasix  infusions started 10/22 RHC: Elevated RA pressure with normal W, consistent with mod-severe PAH. CI 2.91 on milrinone  10/27: HD catheter placed and CRRT started. 10/29 Remains of CRRT for 1 week trial  10/30 patient remains on CRRT, on dobutamine 2.5, also receiving the midodrine  15 mg 3 times daily.   Interim History / Subjective:   NAEO   Coox 78 on DBA 2.5 CVP 4  Cr 1.32 Na 133  Objective    Blood pressure (!) 150/59, pulse 97, temperature (!) 97.5 F (36.4 C), temperature source Axillary, resp. rate (!) 23, height (P) 5' 2 (1.575 m), weight 101.9 kg, SpO2 98%. CVP:  [0 mmHg-19 mmHg] 4 mmHg  FiO2 (%):  [36 %] 36 % PEEP:  [6 cmH20] 6 cmH20    Intake/Output Summary (Last 24 hours) at 12/22/2023 1109 Last data filed at 12/22/2023 1100 Gross per 24 hour  Intake 979.56 ml  Output 4262.7 ml  Net -3283.14 ml   Filed Weights   12/20/23 0500 12/21/23 0500 12/22/23 0438  Weight: 116.4 kg 113.1 kg 101.9 kg    Examination: General: chronically ill obese elderly F  HEENT: NCAT anicteric sclera  CV: rr cap refill <  3 sec  Pulm: diminished bases. Clear  Abs: obese soft  Extremities: no obvious joint deformity  Skin: clean warm Neuro: awake alert  GU: defer  Resolved problem list   Assessment and Plan    OSA/OHS  AoC Hypoxic resp failure -noct NPPV -Wean O2 during day goal spo2 >92  pHTN  HFpEF; end stage R sided heart failure  P -DBA 2.5  -midodrine  15 TID   AKI on CKD3b  -likely cardiorenal P -undergoing a 7d time trial of CRRT   Leukocytosis cellulitis L breast  P -rocephin  linezolid    DM2 -SSI + basal   Physical deconditioning  Pressure injuries  Suboptimal PO intake/ poor appetite  -WOC recs appreciated  -PT/OT  -encourage PO intake. Defer cortrak w GOC ongoing    GOC -pt is undergoing a time trial of CRRT. She unfortunately is not a good HD candidate. Time trial will be 7 days.   -Palliative plans to meet w family again 11/1  -remains full code   Labs   CBC: Recent Labs  Lab 12/18/23 0230 12/18/23 1008 12/19/23  0430 12/20/23 0439 12/21/23 0451 12/22/23 1024  WBC 23.7*  --  27.7* 20.1* 16.1* 19.7*  HGB 7.7*  --  8.4* 7.7* 9.3* 10.1*  HCT 27.1*  --  28.9* 27.3* 32.1* 35.4*  MCV 75.9*  --  74.7* 76.5* 76.4* 77.0*  PLT 136* 134* 155 158 164 156    Basic Metabolic Panel: Recent Labs  Lab 12/18/23 0230 12/19/23 0430 12/19/23 1603 12/20/23 0439 12/20/23 1624 12/20/23 2023 12/21/23 0451 12/21/23 1600 12/22/23 0433  NA 132* 133*   < > 133* 136  --  136  135 134* 133*  K 3.7 3.4*   < > 4.5 4.6  --  4.1  4.1 4.5 4.3  CL 88* 92*   < > 97* 99  --  97*  96* 96* 94*  CO2 29  25   < > 25 25  --  25  25 24 23   GLUCOSE 181* 147*   < > 172* 165*  --  142*  140* 141* 108*  BUN 116* 74*   < > 25* 16  --  8  8 8 8   CREATININE 5.26* 3.59*   < > 1.75* 1.48*  --  1.32*  1.35* 1.43* 1.32*  CALCIUM  9.1 9.0   < > 9.1 8.3*  --  9.1  9.0 9.0 9.4  MG 1.5* 1.8  --  2.4  --   --  2.2  --  2.5*  PHOS  --  2.8   < > 1.5* 3.5 2.7 2.4* 2.7 2.0*   < > = values in this interval not displayed.   GFR: Estimated Creatinine Clearance: 44.8 mL/min (A) (by C-G formula based on SCr of 1.32 mg/dL (H)). Recent Labs  Lab 12/17/23 0455 12/17/23 1125 12/17/23 1537 12/18/23 0230 12/19/23 0430 12/20/23 0439 12/21/23 0451 12/22/23 1024  PROCALCITON 0.41  --   --  1.00  --   --   --   --   WBC 22.1*  --   --  23.7* 27.7* 20.1* 16.1* 19.7*  LATICACIDVEN  --  1.1 1.2  --   --   --   --   --     Liver Function Tests: Recent Labs  Lab 12/20/23 0439 12/20/23 1624 12/21/23 0451 12/21/23 1600 12/22/23 0433  ALBUMIN  2.7* 2.5* 2.7* 2.9* 2.8*   No results for input(s): LIPASE, AMYLASE in the last 168 hours. Recent Labs  Lab 12/18/23 1428  AMMONIA 29    ABG    Component Value Date/Time   PHART 7.39 12/05/2023 0752   PCO2ART 58 (H) 12/05/2023 0752   PO2ART <31 (LL) 12/05/2023 0752   HCO3 38.2 (H) 12/13/2023 1523   TCO2 40 (H) 12/13/2023 1523   ACIDBASEDEF 1.6 03/27/2023 2240   O2SAT 78.4 12/22/2023 0433     Coagulation Profile: Recent Labs  Lab 12/18/23 1008  INR 1.2    Cardiac Enzymes: No results for input(s): CKTOTAL, CKMB, CKMBINDEX, TROPONINI in the last 168 hours.  HbA1C: Hgb A1c MFr Bld  Date/Time Value Ref Range Status  11/29/2023 07:02 PM 8.8 (H) 4.8 - 5.6 % Final    Comment:    (NOTE) Diagnosis of Diabetes The following HbA1c ranges recommended by the American Diabetes Association (ADA) may be used as an aid in the diagnosis of diabetes mellitus.  Hemoglobin             Suggested A1C NGSP%              Diagnosis  <5.7  Non Diabetic  5.7-6.4                Pre-Diabetic  >6.4                   Diabetic  <7.0                   Glycemic control for                       adults with diabetes.    01/15/2023 03:57 AM 9.2 (H) 4.8 - 5.6 % Final    Comment:    (NOTE) Pre diabetes:          5.7%-6.4%  Diabetes:              >6.4%  Glycemic control for   <7.0% adults with diabetes     CBG: Recent Labs  Lab 12/21/23 0607 12/21/23 1108 12/21/23 1558 12/21/23 2126 12/22/23 0608  GLUCAP 109* 181* 129* 110* 120*    CRITICAL CARE Performed by: Ronnald FORBES Gave   Total critical care time: 36 minutes  Critical care time was exclusive of separately billable procedures and treating other patients. Critical care was necessary to treat or prevent imminent or life-threatening deterioration.  Critical care was time spent personally by me on the following activities: development of treatment plan with patient and/or surrogate as well as nursing, discussions with consultants, evaluation of patient's response to treatment, examination of patient, obtaining history from patient or surrogate, ordering and performing treatments and interventions, ordering and review of laboratory studies, ordering and review of radiographic studies, pulse oximetry and re-evaluation of patient's condition.  Ronnald Gave MSN, AGACNP-BC  Pulmonary/Critical Care Medicine Amion for pager  12/22/2023, 11:09 AM

## 2023-12-22 NOTE — Progress Notes (Signed)
 Brief Nutrition Follow-up  Pt remains on CRRT. DBA at 2.5, CVP 4, Coox 78  Eating very minimally. Pt taking bites at most. Per RN, pt has not had anything to eat or drink today. Ate a bite yesterday and that was all. Pt had taken the pro-source but not consistently. Unsure if pt drinking Ensure in the evening.   Pt reports no appetite and cannot indicate any food or drink that would sound good to her.   Phosphorus 2.0, receiving supplementation. Sodium 133, Magnesium  2.5 (H), potassium 4.3 (wdl)  Discussed with PCCM, plan to defer Cortrak consideration given time trial for CRRT (not good iHD candidate) and GOC discussions ongoing.   Interventions: 1) Continue Dysphagia 3 (easy to chew) with feeding assistance at meal times as needed 2) Increase Ensure Plus High Protein po TID, each supplement provides 350 kcal and 20 grams of protein 3) Magic cup BID with meals, each supplement provides 290 kcal and 9 grams of protein 4) 30 ml ProSource Plus BID between meals, each supplement provides 100 kcals and 15 grams protein.  5) Continue Renal MVI daily  Betsey Finger MS, RDN, LDN, CNSC Registered Dietitian 3 Clinical Nutrition RD Inpatient Contact Info in Amion

## 2023-12-22 NOTE — Plan of Care (Signed)
  Problem: Nutritional: Goal: Progress toward achieving an optimal weight will improve Outcome: Progressing   Problem: Safety: Goal: Ability to remain free from injury will improve Outcome: Progressing   Problem: Skin Integrity: Goal: Risk for impaired skin integrity will decrease Outcome: Progressing   Problem: Skin Integrity: Goal: Risk for impaired skin integrity will decrease Outcome: Not Progressing   Problem: Skin Integrity: Goal: Skin integrity will improve Outcome: Not Progressing

## 2023-12-22 NOTE — Progress Notes (Signed)
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  (PROAMATINE ) tablet 5 mg    insulin  glargine-yfgn (SEMGLEE ) injection 25 Units    metolazone (ZAROXOLYN) tablet 5 mg    furosemide  (LASIX ) injection 80 mg    furosemide  (LASIX ) injection 80 mg    furosemide  (LASIX ) injection 80 mg    lidocaine  (PF) (XYLOCAINE ) 1 %  injection Patient Transfer   Heparin  (Porcine) in NaCl 1000-0.9 UT/500ML-% SOLN Patient Transfer   furosemide  (LASIX ) 200 mg in dextrose  5 % 100 mL (2 mg/mL) infusion    potassium chloride  SA (KLOR-CON  M) CR tablet 40 mEq    methylPREDNISolone  sodium succinate (SOLU-MEDROL ) 125 mg/2 mL injection 60 mg    predniSONE (DELTASONE) tablet 20 mg    hydrOXYzine  (ATARAX ) tablet 25 mg    hydrALAZINE  (APRESOLINE ) injection 10 mg    oxyCODONE  (Oxy IR/ROXICODONE ) immediate release tablet 5 mg    heparin  injection 5,000 Units    pregabalin  (LYRICA ) capsule 75 mg    midodrine  (PROAMATINE ) tablet 5 mg    insulin  aspart (novoLOG ) injection 3 Units    insulin  glargine-yfgn (SEMGLEE ) injection 35 Units    oxyCODONE  (Oxy IR/ROXICODONE ) immediate release tablet 10 mg    milrinone (PRIMACOR) 20 MG/100 ML (0.2 mg/mL) infusion    liver oil-zinc  oxide (DESITIN) 40 % ointment    phenylephrine  (NEO-SYNEPHRINE) 20mg /NS 250mL premix infusion    phenylephrine  CONCENTRATED 100mg  in sodium chloride  0.9% (0.4mg /mL) premix infusion    midodrine  (PROAMATINE ) tablet 10 mg    cefTRIAXone  (ROCEPHIN ) 2 g in sodium chloride  0.9 % 100 mL IVPB    linezolid  (ZYVOX ) IVPB 600 mg    midodrine  (PROAMATINE ) tablet 15 mg    phenylephrine  (NEO-SYNEPHRINE) 20mg /NS premix infusion    phenylephrine  CONCENTRATED 100mg  in sodium chloride  0.9% (0.4mg /mL) premix infusion    insulin  glargine-yfgn (SEMGLEE ) injection 25 Units    pantoprazole  (PROTONIX ) EC tablet 40 mg    calcitRIOL (ROCALTROL) capsule 0.25 mcg     Social History:  reports that she has quit smoking. She has never used smokeless tobacco. She reports current alcohol  use. She reports that she does not use drugs.  Family History:   Family History  Problem Relation Age of Onset   Diabetes Mother     Blood pressure (!) 125/57, pulse 97, temperature (!) 97.5 F (36.4 C), temperature source Axillary, resp. rate (!) 21, height (P) 5' 2 (1.575 m), weight 101.9  kg, SpO2 95%. Physical Exam: General appearance: alert, cooperative, and morbidly obese Head: NCAT Eyes: negative Neck: no adenopathy, no carotid bruit Resp: distant BS Cardio: regular rate and rhythm GI: SNDNT +BS Extremities: edema  tr Pulses: 2+ and symmetric     Austina Constantin, LYNWOOD ORN, MD 12/22/2023, 10:18 AM

## 2023-12-23 DIAGNOSIS — I5031 Acute diastolic (congestive) heart failure: Secondary | ICD-10-CM

## 2023-12-23 DIAGNOSIS — J9621 Acute and chronic respiratory failure with hypoxia: Secondary | ICD-10-CM | POA: Diagnosis not present

## 2023-12-23 DIAGNOSIS — N179 Acute kidney failure, unspecified: Secondary | ICD-10-CM | POA: Diagnosis not present

## 2023-12-23 DIAGNOSIS — I50813 Acute on chronic right heart failure: Secondary | ICD-10-CM | POA: Diagnosis not present

## 2023-12-23 DIAGNOSIS — G4733 Obstructive sleep apnea (adult) (pediatric): Secondary | ICD-10-CM | POA: Diagnosis not present

## 2023-12-23 LAB — RENAL FUNCTION PANEL
Albumin: 2.6 g/dL — ABNORMAL LOW (ref 3.5–5.0)
Albumin: 2.7 g/dL — ABNORMAL LOW (ref 3.5–5.0)
Anion gap: 16 — ABNORMAL HIGH (ref 5–15)
Anion gap: 16 — ABNORMAL HIGH (ref 5–15)
BUN: 7 mg/dL — ABNORMAL LOW (ref 8–23)
BUN: 8 mg/dL (ref 8–23)
CO2: 23 mmol/L (ref 22–32)
CO2: 22 mmol/L (ref 22–32)
Calcium: 9.3 mg/dL (ref 8.9–10.3)
Calcium: 9.2 mg/dL (ref 8.9–10.3)
Chloride: 98 mmol/L (ref 98–111)
Chloride: 95 mmol/L — ABNORMAL LOW (ref 98–111)
Creatinine, Ser: 1.29 mg/dL — ABNORMAL HIGH (ref 0.44–1.00)
Creatinine, Ser: 1.09 mg/dL — ABNORMAL HIGH (ref 0.44–1.00)
GFR, Estimated: 45 mL/min — ABNORMAL LOW (ref >60)
GFR, Estimated: 55 mL/min — ABNORMAL LOW (ref >60)
Glucose, Bld: 165 mg/dL — ABNORMAL HIGH (ref 70–99)
Glucose, Bld: 163 mg/dL — ABNORMAL HIGH (ref 70–99)
Phosphorus: 1.8 mg/dL — ABNORMAL LOW (ref 2.5–4.6)
Phosphorus: 2.2 mg/dL — ABNORMAL LOW (ref 2.5–4.6)
Potassium: 4.2 mmol/L (ref 3.5–5.1)
Potassium: 4.4 mmol/L (ref 3.5–5.1)
Sodium: 137 mmol/L (ref 135–145)
Sodium: 133 mmol/L — ABNORMAL LOW (ref 135–145)

## 2023-12-23 LAB — CBC
HCT: 35.5 % — ABNORMAL LOW (ref 36.0–46.0)
Hemoglobin: 10.2 g/dL — ABNORMAL LOW (ref 12.0–15.0)
MCH: 22 pg — ABNORMAL LOW (ref 26.0–34.0)
MCHC: 28.7 g/dL — ABNORMAL LOW (ref 30.0–36.0)
MCV: 76.5 fL — ABNORMAL LOW (ref 80.0–100.0)
Platelets: 155 K/uL (ref 150–400)
RBC: 4.64 MIL/uL (ref 3.87–5.11)
RDW: 22.2 % — ABNORMAL HIGH (ref 11.5–15.5)
WBC: 18.4 K/uL — ABNORMAL HIGH (ref 4.0–10.5)
nRBC: 0.5 % — ABNORMAL HIGH (ref 0.0–0.2)

## 2023-12-23 LAB — GLUCOSE, CAPILLARY
Glucose-Capillary: 135 mg/dL — ABNORMAL HIGH (ref 70–99)
Glucose-Capillary: 173 mg/dL — ABNORMAL HIGH (ref 70–99)
Glucose-Capillary: 155 mg/dL — ABNORMAL HIGH (ref 70–99)
Glucose-Capillary: 158 mg/dL — ABNORMAL HIGH (ref 70–99)

## 2023-12-23 LAB — APTT: aPTT: 98 s — ABNORMAL HIGH (ref 24–36)

## 2023-12-23 LAB — COOXEMETRY PANEL
Carboxyhemoglobin: 2.5 % — ABNORMAL HIGH (ref 0.5–1.5)
Methemoglobin: <0.7 % (ref 0.0–1.5)
O2 Saturation: 69.7 %
Total hemoglobin: 10.4 g/dL — ABNORMAL LOW (ref 12.0–16.0)

## 2023-12-23 LAB — MAGNESIUM: Magnesium: 2.4 mg/dL (ref 1.7–2.4)

## 2023-12-23 MED ORDER — POTASSIUM PHOSPHATES 15 MMOLE/5ML IV SOLN
15.0000 mmol | Freq: Once | INTRAVENOUS | Status: DC
  Filled 2023-12-23: qty 5

## 2023-12-23 MED ORDER — POTASSIUM PHOSPHATES 15 MMOLE/5ML IV SOLN
15.0000 mmol | Freq: Once | INTRAVENOUS | Status: AC
  Administered 2023-12-23: 15 mmol via INTRAVENOUS
  Filled 2023-12-23: qty 5

## 2023-12-23 NOTE — Progress Notes (Signed)
 Daily Progress Note   Patient Name: [name] [name]       Date: 12/23/2023 DOB: [birth year]  Age: 68 y.o. MRN#: [id number] Attending Physician: Gatha Pence, MD Primary Care Physician: [facility] Admit Date: 11/29/2023  Reason for Consultation/Follow-up: Establishing goals of care  Subjective: I have reviewed medical records including EPIC notes, MAR, and labs. Received report from primary RN -no acute concerns.  Per RN no significant changes-patient still remains with poor oral intake, still on CRRT.  11:00 AM Went to patient's bedside for scheduled family meeting this morning-7 family members present including son/Keith, 3 sisters, several significant others.  Patient was lying in bed awake and oriented to self, date, place; however, lacks insight into her acute medical situation.  She is not able to verbalize information back despite conversations that had just happened in front of her.  I personally believe she is not able to make complex medical decisions at this time, though family disagree and are wanting her to make her own decisions.  Emotional support provided to patient and family.  Therapeutic listening provided as they reflect on the information they have been given since patient's admission.  Son does have a clear understanding of patient's current acute medical situation.  Lab work, specific to creatinine, reviewed per their request and discussed in context of her kidney function.   Family are clear that the plan is to continue CRRT until Tuesday knowing that this intervention will be stopped at that time.  Medical pathways were reviewed: If patient improves family would be interested in continuing full aggressive care; however, if no improvement/decline/no renal  function recovery they understand that comfort/hospice conversations would be needed.  Patient's decreased oral intake was discussed.  Patient denies nausea, just lack of appetite.  We reviewed that anorexia is a natural response to disease.  Reviewed options of pursuing temporary/ trial feeding tube for artificial nutrition versus encouraging oral intake.  Patient states I will try to eat and do the best I can.  Patient and family do not want to pursue feeding tube at this time.   Encouraged patient/family to consider DNR/DNI status understanding evidenced based poor outcomes in similar hospitalized patient, as the cause of arrest is likely associated with advanced chronic/terminal illness rather than an easily reversible acute cardio-pulmonary event.  I shared that even  if we pursued resuscitation we would not able to resolve the underlying factors. I explained that DNR/DNI does not change the medical plan and it only comes into effect after a person has arrested (died).  It is a protective measure to keep us  from harming the patient in their last moments of life.  Family want patient to make this decision, though patient does not seem to fully grasp information as discussed.  Patient simply states she wants to live.  Patient/Family were not agreeable to DNR/DNI with understanding that she would receive CPR, defibrillation, ACLS medications, and/or intubation.   PMT will follow-up with son on Monday and we will plan for a follow-up family meeting on Tuesday before CRRT is stopped to discuss plan of care, what to anticipate, next steps.  All questions and concerns addressed. Encouraged to call with questions and/or concerns. PMT card provided.  Length of Stay: 24  Current Medications: Scheduled Meds:   (feeding supplement) PROSource Plus  30 mL Oral BID BM   acidophilus  2 capsule Oral TID   allopurinol   100 mg Oral QHS   arformoterol  15 mcg Nebulization BID   atorvastatin   10 mg Oral QHS    budesonide (PULMICORT) nebulizer solution  0.5 mg Nebulization BID   capsicum   Topical BID   Chlorhexidine  Gluconate Cloth  6 each Topical Daily   diclofenac Sodium  2 g Topical QID   famotidine   20 mg Oral Daily   feeding supplement  237 mL Oral Daily   fluticasone  2 spray Each Nare Daily   Gerhardt's butt cream   Topical BID   heparin  injection (subcutaneous)  5,000 Units Subcutaneous Q8H   insulin  aspart  0-20 Units Subcutaneous TID WC   insulin  aspart  0-5 Units Subcutaneous QHS   insulin  glargine-yfgn  10 Units Subcutaneous Daily   ipratropium-albuterol   3 mL Nebulization BID   loratadine   10 mg Oral Daily   midodrine   15 mg Oral Q8H   multivitamin  1 tablet Oral QHS   nystatin  cream  1 Application Topical BID   polycarbophil  625 mg Oral Daily   polyethylene glycol  17 g Oral Daily   pregabalin   50 mg Oral Daily   sertraline  50 mg Oral q AM   sodium chloride  flush  10-40 mL Intracatheter Q12H   triamcinolone  1 Application Topical Daily    Continuous Infusions:  cefTRIAXone  (ROCEPHIN )  IV Stopped (12/22/23 1340)   DOBUTamine 2.5 mcg/kg/min (12/23/23 0900)   heparin  10,000 units/ 20 mL infusion syringe 1,000 Units/hr (12/23/23 0415)   linezolid  (ZYVOX ) IV 600 mg (12/23/23 1044)   norepinephrine  (LEVOPHED ) Adult infusion     potassium PHOSPHATE  IVPB (in mmol) 15 mmol (12/23/23 1034)   potassium PHOSPHATE  IVPB (in mmol)     prismasol BGK 4/2.5 500 mL/hr at 12/23/23 0920   prismasol BGK 4/2.5 1,500 mL/hr at 12/23/23 0830   prismasol BGK 4/2.5 400 mL/hr at 12/22/23 2312    PRN Meds: acetaminophen  **OR** acetaminophen , albuterol , camphor-menthol , fentaNYL  (SUBLIMAZE ) injection, heparin , methocarbamol, naphazoline-pheniramine, nystatin , ondansetron  **OR** ondansetron  (ZOFRAN ) IV, mouth rinse, oxyCODONE , sodium chloride , sodium chloride  flush, traZODone   Physical Exam Vitals and nursing note reviewed.  Constitutional:      General: She is not in acute  distress. Pulmonary:     Effort: No respiratory distress.  Skin:    General: Skin is warm and dry.  Neurological:     Mental Status: She is oriented to person, place, and time. She is  confused.     Motor: Weakness present.  Psychiatric:        Attention and Perception: Attention normal.        Behavior: Behavior is cooperative.        Cognition and Memory: Cognition is impaired. Memory is impaired.             Vital Signs: BP 131/66 (BP Location: Left Arm)   Pulse 94   Temp (!) 97 F (36.1 C) (Axillary)   Resp (!) 23   Ht (P) 5' 2 (1.575 m)   Wt 101.9 kg   SpO2 99%   BMI (P) 41.09 kg/m  SpO2: SpO2: 99 % O2 Device: O2 Device: Nasal Cannula O2 Flow Rate: O2 Flow Rate (L/min): 4 L/min  Intake/output summary:  Intake/Output Summary (Last 24 hours) at 12/23/2023 1047 Last data filed at 12/23/2023 0900 Gross per 24 hour  Intake 994.47 ml  Output 1993.8 ml  Net -999.33 ml   LBM: Last BM Date : 12/22/23 Baseline Weight: Weight: 121.1 kg Most recent weight: Weight: 101.9 kg       Palliative Assessment/Data: PPS 20-30%      Patient Active Problem List   Diagnosis Date Noted   Acute on chronic systolic congestive heart failure (HCC) 12/19/2023   Intermittent confusion 12/05/2023   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   COPD with acute exacerbation (HCC) 12/04/2023   Cellulitis of left breast 11/30/2023   Acute respiratory failure with hypoxia (HCC) 11/29/2023   Palliative care by specialist 04/02/2023   Goals of care, counseling/discussion 04/02/2023   Acute on chronic right heart failure (HCC) 04/01/2023   Obesity hypoventilation syndrome (HCC) 04/01/2023   Cardiogenic shock (HCC) 04/01/2023   Acute on chronic respiratory failure with hypoxia and hypercapnia (HCC) 03/27/2023   Acute hypoxic respiratory failure (HCC) 02/16/2023   Acute on chronic hypoxic respiratory failure (HCC) 01/14/2023   Acute on chronic heart failure with preserved ejection fraction (HFpEF) (HCC)  01/13/2023   Chronic respiratory failure with hypoxia (HCC) 01/13/2023   Chronic kidney disease, stage 3a (HCC) 10/02/2022   Chronic kidney disease, stage 3b (HCC) 10/02/2022   Hyperglycemia due to type 2 diabetes mellitus (HCC) 09/26/2021   Type 2 diabetes mellitus with hyperglycemia, with long-term current use of insulin  (HCC) 09/25/2021   (HFpEF) heart failure with preserved ejection fraction (HCC) 09/25/2021   Hypomagnesemia 09/08/2021   Severe sepsis (HCC) 09/07/2021   Bacteremia due to Klebsiella pneumoniae 09/07/2021   Urinary tract infection 09/07/2021   Acute respiratory failure with hypoxia and hypercapnia (HCC) 09/04/2021   DKA (diabetic ketoacidosis) (HCC) 02/02/2021   Morbid obesity with BMI of 50.0-59.9, adult (HCC) 02/02/2021   Hyperglycemia 02/01/2021   Pancreatitis 09/07/2020   Intractable nausea and vomiting 09/07/2020   Functional quadriplegia (HCC) 09/07/2020   General weakness 08/26/2020   OSA (obstructive sleep apnea) 08/26/2020   Constipation    Acute kidney injury 08/25/2020   Acute renal failure superimposed on stage 3b chronic kidney disease (HCC) 07/30/2020   Lower extremity cellulitis 02/04/2020   Acute on chronic diastolic heart failure (HCC) 11/23/2019   Vaginal discharge 11/06/2019   Morbid obesity (HCC) 08/22/2019   Opiate overdose (HCC) 08/20/2019   Cellulitis 10/05/2018   Acute cystitis without hematuria 10/05/2018   Chronic hypotension 10/05/2018   Pressure injury of skin 10/05/2018   Hypoglycemia secondary to sulfonylurea 11/21/2016   Substance induced mood disorder (HCC) 07/02/2014   Acute metabolic encephalopathy    Hyperkalemia    Hypokalemia    Insulin  dependent type  2 diabetes mellitus (HCC)    Hyperlipidemia associated with type 2 diabetes mellitus (HCC)    Anxiety and depression    Anxiety state     Palliative Care Assessment & Plan   Patient Profile: 68 y.o. female  with past medical history of  insulin -dependent type 2  diabetes, chronic heart failure with preserved EF, morbid obesity, bedbound, pulmonary hypertension admitted on 11/29/2023 with edema and weight gain.    Patient was admitted for acute exacerbation of chronic heart failure with preserved EF.  Her hospital course is complicated by low output state requiring milrinone infusion and lasix  infusion, as well as worsening AKI with Cr of 5.26 increased today from 4.3. Requiring transfer to ICU for CRRT. PMT has been consulted to assist with goals of care conversation.  Assessment: Principal Problem:   Acute respiratory failure with hypoxia (HCC) Active Problems:   Insulin  dependent type 2 diabetes mellitus (HCC)   Hyperlipidemia associated with type 2 diabetes mellitus (HCC)   Anxiety and depression   Morbid obesity (HCC)   Acute kidney injury   OSA (obstructive sleep apnea)   Hyperglycemia due to type 2 diabetes mellitus (HCC)   Chronic kidney disease, stage 3b (HCC)   Acute on chronic heart failure with preserved ejection fraction (HFpEF) (HCC)   Obesity hypoventilation syndrome (HCC)   Cellulitis of left breast   COPD with acute exacerbation (HCC)   Intermittent confusion   Vaginal fistula   Acute on chronic systolic congestive heart failure (HCC)   Recommendations/Plan: Continue full code/full scope Continue 1 week time-limited trial of CRRT - ends Tuesday 11/4 Follow-up family meeting planned for Tuesday 11/4 prior to discontinuation of CRRT - time TBD Ongoing goals of care discussions pending clinical course PMT will continue to follow and support holistically  Goals of Care and Additional Recommendations: Limitations on Scope of Treatment: Full Scope Treatment  Code Status:    Code Status Orders  (From admission, onward)           Start     Ordered   12/18/23 1147  Full code  (Code Status)  Continuous       Question:  By:  Answer:  Consent: discussion documented in EHR   12/18/23 1146           Code Status History      Date Active Date Inactive Code Status Order ID Comments User Context   12/18/2023 1035 12/18/2023 1146 Limited: Do not attempt resuscitation (DNR) -DNR-LIMITED -Do Not Intubate/DNI  [id number]  Wonda Mickle SQUIBB, PA-C Inpatient   11/29/2023 1755 12/18/2023 1035 Full Code [id number]  Zella Katha HERO, MD ED   03/27/2023 1957 04/06/2023 1639 Full Code [id number]  Gretta Leita SQUIBB, DO ED   02/16/2023 0001 02/23/2023 1736 Full Code [id number]  Dena Charleston, MD ED   01/13/2023 2157 01/17/2023 1816 Full Code [id number]  Tobie Jorie SAUNDERS, MD ED   10/02/2022 2119 10/07/2022 1824 Full Code [id number]  Ricky Alfrieda DASEN, DO ED   09/25/2021 2316 09/28/2021 1746 Full Code [id number]  Tobie Jorie SAUNDERS, MD ED   09/04/2021 2332 09/09/2021 1645 Full Code [id number]  Jude Harden GAILS, MD ED   02/01/2021 1904 02/03/2021 2234 Full Code [id number]  Rockey Denece LABOR, DO Inpatient   09/07/2020 2114 09/26/2020 0527 Full Code [id number]  Silvester Ales, MD ED   08/26/2020 0100 08/29/2020 0920 Full Code [id number]  Charlton Evalene RAMAN, MD ED   07/25/2020 1650 08/07/2020 0216 Full Code [id number]  Cheryle Page, MD ED  02/04/2020 0308 02/12/2020 0303 Full Code [id number]  Franky Redia SAILOR, MD Inpatient   11/06/2019 0028 11/23/2019 2051 Full Code [id number]  Marylene Cousin, MD ED   10/19/2019 0620 10/20/2019 2247 Full Code [id number]  Franky Redia SAILOR, MD ED   08/20/2019 1754 08/24/2019 0604 Full Code [id number]  Veleta Mady LABOR, DO ED   10/05/2018 0743 10/09/2018 1818 Full Code [id number]  Franky Redia SAILOR, MD Inpatient   11/21/2016 2330 11/24/2016 1656 Full Code [id number]  Lonzell Emeline HERO, DO ED   06/28/2014 1508 07/02/2014 1725 Full Code [id number]  Shellia Oh, MD ED       Prognosis:  Unable to determine  Discharge Planning: To Be Determined  Care plan was discussed with primary RN, patient, patient's family, attending  Thank you for allowing the Palliative Medicine Team to assist in the care of this patient.   Total Time 70 minutes  Prolonged Time Billed  yes       Jeoffrey HERO Sharps, NP  Please contact Palliative Medicine Team phone at [phone] for questions and concerns.   *Portions of this note are a verbal dictation therefore any spelling and/or grammatical errors are due to the Dragon [employer] system interpretation.

## 2023-12-23 NOTE — Progress Notes (Signed)
 NAME:  [name] [name], MRN:  [id number], DOB:  Sep 08, 1955, LOS: 24 ADMISSION DATE:  11/29/2023, CONSULTATION DATE:  12/18/23 REFERRING MD:  Fairy Frames, CHIEF COMPLAINT:  diastolic heart failure   History of Present Illness:  68 year old female with known OSA/OHS, HFpEF, pulmonary hypertension, RV failure CKD 4, bedbound x 3 years admitted 10/8 with acute on chronic HFpEF with 2 week history of weight gain, lower extremity edema and SOB.  Her hospital course is complicated by low output state requiring milrinone infusion and lasix  infusion.  Due to ongoing challenges with continued congestion and worsening AKI despite high dose diuretics,  patient was transferred to ICU on 10/27 for Vascath placement and need for CVVHD  RHC 10/22: elevated RA Pressure with normal W.    Pertinent  Medical History  OSA/OHS, poor compliance with CPAP HFpEF Pulmonary hypertension RV failure CKD stage IV Bedbound x 3 years  Significant Hospital Events: Including procedures, antibiotic start and stop dates in addition to other pertinent events   10/8: Admitted with acute on chronic HFpEF with severe pulmonary hypertension-- started on intermittent diuresis 10/9: TTE LVEF 60-65% with RV function mildly reduced, enlarged RV  10/12: milrinone and lasix  infusions started 10/22 RHC: Elevated RA pressure with normal W, consistent with mod-severe PAH. CI 2.91 on milrinone  10/27: HD catheter placed and CRRT started. 10/29 Remains of CRRT for 1 week trial  10/30 patient remains on CRRT, on dobutamine 2.5, also receiving the midodrine  15 mg 3 times daily.  10/31 HF signed off, feel she is euvolemic recommend stopping CRRT.  Interim History / Subjective:  Complaining of sacral, buttock discomfort.  Otherwise pain free Breathing OK Tolerating NIV at night per the patient, but from charting it doesn't look like it was used last night.    Objective    Blood pressure 128/70, pulse 92, temperature (!) 97 F  (36.1 C), temperature source Axillary, resp. rate (!) 29, height (P) 5' 2 (1.575 m), weight 101.9 kg, SpO2 98%. CVP:  [2 mmHg-46 mmHg] 10 mmHg  FiO2 (%):  [36 %] 36 %   Intake/Output Summary (Last 24 hours) at 12/23/2023 0750 Last data filed at 12/23/2023 0600 Gross per 24 hour  Intake 1071.87 ml  Output 2184.8 ml  Net -1112.93 ml   Filed Weights   12/21/23 0500 12/22/23 0438 12/23/23 0500  Weight: 113.1 kg 101.9 kg 101.9 kg    Examination:  General: Morbidly obese chronically ill appearing female HEENT: High [place], AT, PERRL, no JVD CV: RRR Pulm: unlabored, diminished bases Abs: obese, soft, NT Extremities: No acute deformity or edema.  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 If renal function does not recover a palliative regimen is recommended.   AKI on CKD3b  -likely cardiorenal P -undergoing a 7d time trial of CRRT, inititated 10/28 - Phos replacement today 15 mmol  Leukocytosis cellulitis L breast  P -rocephin  linezolid  > stop dates in place  DM2 -CBGs within desired range -SSI +  basal > no change  Physical deconditioning  Pressure injuries  Suboptimal PO intake/ poor appetite  -WOC recs appreciated  -PT/OT  -encourage PO intake. Defer cortrak w GOC ongoing    GOC -pt is undergoing a time trial of CRRT. She unfortunately is not a good HD candidate. Time trial originally set at 7 days, but she is felt to be euvolemic at this time and it is reasonable to stop now. PMT planning to meet with family  today.  -Full code  Labs   CBC: Recent Labs  Lab 12/19/23 0430 12/20/23 0439 12/21/23 0451 12/22/23 1024 12/23/23 0518  WBC 27.7* 20.1* 16.1* 19.7* 18.4*  HGB 8.4* 7.7* 9.3* 10.1* 10.2*  HCT 28.9* 27.3* 32.1* 35.4* 35.5*  MCV 74.7* 76.5* 76.4* 77.0* 76.5*  PLT 155 158 164 156 155    Basic Metabolic Panel: Recent Labs  Lab 12/19/23 0430 12/19/23 1603 12/20/23 0439 12/20/23 1624 12/21/23 0451 12/21/23 1600 12/22/23 0433 12/22/23 1611 12/23/23 0518  NA 133*   < > 133*   < > 136  135 134* 133* 138 137  K 3.4*   < > 4.5   < > 4.1  4.1 4.5 4.3 4.2 4.2  CL 92*   < > 97*   < > 97*  96* 96* 94* 98 98  CO2 25   < > 25   < > 25  25 24 23 24 23   GLUCOSE 147*   < > 172*   < > 142*  140* 141* 108* 109* 165*  BUN 74*   < > 25*   < > 8  8 8 8 8  7*  CREATININE 3.59*   < > 1.75*   < > 1.32*  1.35* 1.43* 1.32* 1.32* 1.29*  CALCIUM  9.0   < > 9.1   < > 9.1  9.0 9.0 9.4 9.0 9.3  MG 1.8  --  2.4  --  2.2  --  2.5*  --  2.4  PHOS 2.8   < > 1.5*   < > 2.4* 2.7 2.0* 2.4* 1.8*   < > = values in this interval not displayed.   GFR: Estimated Creatinine Clearance: 45.8 mL/min (A) (by C-G formula based on SCr of 1.29 mg/dL (H)). Recent Labs  Lab 12/17/23 0455 12/17/23 1125 12/17/23 1537 12/18/23 0230 12/19/23 0430 12/20/23 0439 12/21/23 0451 12/22/23 1024 12/23/23 0518  PROCALCITON 0.41  --   --  1.00  --   --   --   --   --   WBC 22.1*  --   --  23.7*   < > 20.1* 16.1* 19.7* 18.4*  LATICACIDVEN  --  1.1 1.2  --   --   --   --   --   --    < > = values in this interval not displayed.    Liver Function Tests: Recent Labs  Lab 12/21/23 0451 12/21/23 1600 12/22/23 0433 12/22/23 1611 12/23/23 0518  ALBUMIN  2.7* 2.9* 2.8* 2.7* 2.6*   No results for input(s): LIPASE, AMYLASE in the last 168 hours. Recent Labs  Lab 12/18/23 1428  AMMONIA 29    ABG    Component Value Date/Time   PHART 7.39 12/05/2023 0752   PCO2ART 58 (H) 12/05/2023 0752   PO2ART <31 (LL)  12/05/2023 0752   HCO3 38.2 (H) 12/13/2023 1523   TCO2 40 (H) 12/13/2023 1523   ACIDBASEDEF 1.6 03/27/2023 2240   O2SAT 69.7 12/23/2023 0518     Coagulation Profile: Recent  Labs  Lab 12/18/23 1008  INR 1.2    Cardiac Enzymes: No results for input(s): CKTOTAL, CKMB, CKMBINDEX, TROPONINI in the last 168 hours.  HbA1C: Hgb A1c MFr Bld  Date/Time Value Ref Range Status  11/29/2023 07:02 PM 8.8 (H) 4.8 - 5.6 % Final    Comment:    (NOTE) Diagnosis of Diabetes The following HbA1c ranges recommended by the American Diabetes Association (ADA) may be used as an aid in the diagnosis of diabetes mellitus.  Hemoglobin             Suggested A1C NGSP%              Diagnosis  <5.7                   Non Diabetic  5.7-6.4                Pre-Diabetic  >6.4                   Diabetic  <7.0                   Glycemic control for                       adults with diabetes.    01/15/2023 03:57 AM 9.2 (H) 4.8 - 5.6 % Final    Comment:    (NOTE) Pre diabetes:          5.7%-6.4%  Diabetes:              >6.4%  Glycemic control for   <7.0% adults with diabetes     CBG: Recent Labs  Lab 12/22/23 0608 12/22/23 1135 12/22/23 1626 12/22/23 2103 12/23/23 0611  GLUCAP 120* 167* 112* 127* 135*    CRITICAL CARE Performed by: Deward LELON Eastern   Total critical care time: 38 minutes  Critical care time was exclusive of separately billable procedures and treating other patients. Critical care was necessary to treat or prevent imminent or life-threatening deterioration.  Critical care was time spent personally by me on the following activities: development of treatment plan with patient and/or surrogate as well as nursing, discussions with consultants, evaluation of patient's response to treatment, examination of patient, obtaining history from patient or surrogate, ordering and performing treatments and interventions, ordering and review of laboratory studies, ordering and  review of radiographic studies, pulse oximetry and re-evaluation of patient's condition.   Deward Eastern, AGACNP-BC [facility]  See Amion for personal pager PCCM on call pager [phone] until 7pm. Please call Elink 7p-7a. [phone]  12/23/2023 7:52 AM

## 2023-12-23 NOTE — Progress Notes (Signed)
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 (SEMGLEE ) injection 35 Units    oxyCODONE  (Oxy IR/ROXICODONE ) immediate release tablet 10 mg    milrinone (PRIMACOR) 20 MG/100 ML (0.2 mg/mL) infusion    liver oil-zinc  oxide (DESITIN) 40 % ointment    phenylephrine  (NEO-SYNEPHRINE) 20mg /NS 250mL premix infusion    phenylephrine  CONCENTRATED 100mg  in sodium chloride  0.9% (0.4mg /mL) premix infusion     midodrine  (PROAMATINE ) tablet 10 mg    cefTRIAXone  (ROCEPHIN ) 2 g in sodium chloride  0.9 % 100 mL IVPB    linezolid  (ZYVOX ) IVPB 600 mg    midodrine  (PROAMATINE ) tablet 15 mg    phenylephrine  (NEO-SYNEPHRINE) 20mg /NS premix infusion    phenylephrine  CONCENTRATED 100mg  in sodium chloride  0.9% 250mL (0.4mg /mL) premix infusion    insulin  glargine-yfgn (SEMGLEE ) injection 25 Units    pantoprazole  (PROTONIX ) EC tablet 40 mg    calcitRIOL (ROCALTROL) capsule 0.25 mcg     Social History:  reports that she has quit smoking. She has never used smokeless tobacco. She reports current alcohol  use. She reports that she does not use drugs.  Family History:   Family History  Problem Relation Age of Onset   Diabetes Mother     Blood pressure 131/66, pulse 94, temperature (!) 97 F (36.1 C), temperature source Axillary, resp. rate (!) 23, height (P) 5' 2 (1.575 m), weight 101.9 kg, SpO2 99%. Physical Exam: General appearance: alert, cooperative, and morbidly obese Head: NCAT Eyes: negative Neck: no adenopathy, no carotid bruit Resp: distant BS Cardio: regular rate and rhythm GI: SNDNT +BS Extremities: edema tr Pulses: 2+ and symmetric     Douglass Dunshee, LYNWOOD ORN, MD 12/23/2023, 8:43 AM

## 2023-12-24 DIAGNOSIS — I5033 Acute on chronic diastolic (congestive) heart failure: Secondary | ICD-10-CM | POA: Diagnosis not present

## 2023-12-24 DIAGNOSIS — J9620 Acute and chronic respiratory failure, unspecified whether with hypoxia or hypercapnia: Secondary | ICD-10-CM | POA: Diagnosis not present

## 2023-12-24 DIAGNOSIS — N1832 Chronic kidney disease, stage 3b: Secondary | ICD-10-CM | POA: Diagnosis not present

## 2023-12-24 DIAGNOSIS — D696 Thrombocytopenia, unspecified: Secondary | ICD-10-CM | POA: Diagnosis not present

## 2023-12-24 LAB — COOXEMETRY PANEL
Carboxyhemoglobin: 3.3 % — ABNORMAL HIGH (ref 0.5–1.5)
Methemoglobin: <0.7 % (ref 0.0–1.5)
O2 Saturation: 73.7 %
Total hemoglobin: 11.1 g/dL — ABNORMAL LOW (ref 12.0–16.0)

## 2023-12-24 LAB — CBC
HCT: 37.8 % (ref 36.0–46.0)
Hemoglobin: 10.9 g/dL — ABNORMAL LOW (ref 12.0–15.0)
MCH: 21.9 pg — ABNORMAL LOW (ref 26.0–34.0)
MCHC: 28.8 g/dL — ABNORMAL LOW (ref 30.0–36.0)
MCV: 76.1 fL — ABNORMAL LOW (ref 80.0–100.0)
Platelets: 134 K/uL — ABNORMAL LOW (ref 150–400)
RBC: 4.97 MIL/uL (ref 3.87–5.11)
RDW: 22.5 % — ABNORMAL HIGH (ref 11.5–15.5)
WBC: 18.7 K/uL — ABNORMAL HIGH (ref 4.0–10.5)
nRBC: 0.4 % — ABNORMAL HIGH (ref 0.0–0.2)

## 2023-12-24 LAB — RENAL FUNCTION PANEL
Albumin: 2.9 g/dL — ABNORMAL LOW (ref 3.5–5.0)
Albumin: 3 g/dL — ABNORMAL LOW (ref 3.5–5.0)
Anion gap: 18 — ABNORMAL HIGH (ref 5–15)
Anion gap: 18 — ABNORMAL HIGH (ref 5–15)
BUN: 8 mg/dL (ref 8–23)
BUN: 14 mg/dL (ref 8–23)
CO2: 21 mmol/L — ABNORMAL LOW (ref 22–32)
CO2: 22 mmol/L (ref 22–32)
Calcium: 9.7 mg/dL (ref 8.9–10.3)
Calcium: 9.7 mg/dL (ref 8.9–10.3)
Chloride: 95 mmol/L — ABNORMAL LOW (ref 98–111)
Chloride: 96 mmol/L — ABNORMAL LOW (ref 98–111)
Creatinine, Ser: 1.39 mg/dL — ABNORMAL HIGH (ref 0.44–1.00)
Creatinine, Ser: 1.3 mg/dL — ABNORMAL HIGH (ref 0.44–1.00)
GFR, Estimated: 41 mL/min — ABNORMAL LOW (ref >60)
GFR, Estimated: 44 mL/min — ABNORMAL LOW (ref >60)
Glucose, Bld: 188 mg/dL — ABNORMAL HIGH (ref 70–99)
Glucose, Bld: 228 mg/dL — ABNORMAL HIGH (ref 70–99)
Phosphorus: 2.2 mg/dL — ABNORMAL LOW (ref 2.5–4.6)
Phosphorus: 3.3 mg/dL (ref 2.5–4.6)
Potassium: 4.5 mmol/L (ref 3.5–5.1)
Potassium: 4.5 mmol/L (ref 3.5–5.1)
Sodium: 134 mmol/L — ABNORMAL LOW (ref 135–145)
Sodium: 136 mmol/L (ref 135–145)

## 2023-12-24 LAB — GLUCOSE, CAPILLARY
Glucose-Capillary: 176 mg/dL — ABNORMAL HIGH (ref 70–99)
Glucose-Capillary: 209 mg/dL — ABNORMAL HIGH (ref 70–99)
Glucose-Capillary: 228 mg/dL — ABNORMAL HIGH (ref 70–99)
Glucose-Capillary: 171 mg/dL — ABNORMAL HIGH (ref 70–99)

## 2023-12-24 LAB — APTT: aPTT: 77 s — ABNORMAL HIGH (ref 24–36)

## 2023-12-24 LAB — MAGNESIUM: Magnesium: 2.4 mg/dL (ref 1.7–2.4)

## 2023-12-24 MED ORDER — INSULIN GLARGINE-YFGN 100 UNIT/ML ~~LOC~~ SOLN
15.0000 [IU] | Freq: Every day | SUBCUTANEOUS | Status: DC
  Administered 2023-12-25 – 2023-12-26 (×2): 15 [IU] via SUBCUTANEOUS
  Filled 2023-12-24 (×2): qty 0.15

## 2023-12-24 MED ORDER — SODIUM PHOSPHATES 45 MMOLE/15ML IV SOLN
30.0000 mmol | Freq: Once | INTRAVENOUS | Status: AC
  Administered 2023-12-24: 30 mmol via INTRAVENOUS
  Filled 2023-12-24: qty 10

## 2023-12-24 NOTE — Plan of Care (Signed)
  Add All Education: Ability to describe self-care measures that may prevent or decrease complications (Diabetes Survival Skills Education) will improve Add Today at 2359 - Progressing by Rolfe Corean HERO, RN Add Individualized Educational Video(s) Add Today at 2359 - Progressing by Rolfe Corean HERO, RN Add Coping: Ability to adjust to condition or change in health will improve Add Today at 2359 - Progressing by Rolfe Corean HERO, RN Add Fluid Volume: Ability to maintain a balanced intake and output will improve Add Today at 2359 - Progressing by Rolfe Corean HERO, RN Add Health Behavior/Discharge Planning: Ability to identify and utilize available resources and services will improve Add Today at 2359 - Progressing by Rolfe Corean HERO, RN Add Ability to manage health-related needs will improve Add Today at 2359 - Progressing by Rolfe Corean HERO, RN Add Metabolic: Ability to maintain appropriate glucose levels will improve Add Today at 2359 - Progressing by Rolfe Corean HERO, RN Add Nutritional: Maintenance of adequate nutrition will improve Add Today at 2359 - Progressing by Rolfe Corean HERO, RN Add Progress toward achieving an optimal weight will improve Add Today at 2359 - Progressing by Rolfe Corean HERO, RN Add Skin Integrity: Risk for impaired skin integrity will decrease Add Today at 2359 - Progressing by Rolfe Corean HERO, RN Add Tissue Perfusion: Adequacy of tissue perfusion will improve Add Today at 2359 - Progressing by Rolfe Corean HERO, RN Add Education: Knowledge of General Education information will improve Add Today at 2359 - Progressing by Rolfe Corean HERO, RN Add Health Behavior/Discharge Planning: Ability to manage health-related needs will improve Add Today at 2359 - Progressing by Rolfe Corean HERO, RN Add Clinical Measurements: Ability to maintain clinical  measurements within normal limits will improve Add Today at 2359 - Progressing by Rolfe Corean HERO, RN Add Will remain free from infection Add Today at 2359 - Progressing by Rolfe Corean HERO, RN Add Diagnostic test results will improve Add Today at 2359 - Progressing by Rolfe Corean HERO, RN Add Respiratory complications will improve Add Today at 2359 - Progressing by Rolfe Corean HERO, RN Add Cardiovascular complication will be avoided Add Today at 2359 - Progressing by Rolfe Corean HERO, RN Add Activity: Risk for activity intolerance will decrease Add Today at 2359 - Progressing by Rolfe Corean HERO, RN Add Coping: Level of anxiety will decrease Add Today at 2359 - Progressing by Rolfe Corean HERO, RN Add Pain Managment: General experience of comfort will improve and/or be controlled Add Today at 2359 - Progressing by Rolfe Corean HERO, RN Add Safety: Ability to remain free from injury will improve Add Today at 2359 - Progressing by Rolfe Corean HERO, RN Add Skin Integrity: Risk for impaired skin integrity will decrease Add Today at 2359 - Progressing by Rolfe Corean HERO, RN Add Clinical Measurements: Ability to avoid or minimize complications of infection will improve Add Today at 2359 - Progressing by Rolfe Corean HERO, RN Add Skin Integrity: Skin integrity will improve Add Today at 2359 - Progressing by Rolfe Corean HERO, RN

## 2023-12-24 NOTE — Progress Notes (Signed)
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 mg    oxyCODONE  (Oxy IR/ROXICODONE ) immediate release tablet 5 mg    heparin  injection 5,000 Units    pregabalin  (LYRICA ) capsule 75 mg    midodrine  (PROAMATINE ) tablet 5 mg    insulin  aspart (novoLOG ) injection 3 Units    insulin  glargine-yfgn (SEMGLEE ) injection 35 Units    oxyCODONE  (Oxy IR/ROXICODONE )  immediate release tablet 10 mg    milrinone (PRIMACOR) 20 MG/100 ML (0.2 mg/mL) infusion    liver oil-zinc  oxide (DESITIN) 40 % ointment    phenylephrine  (NEO-SYNEPHRINE) 20mg /NS premix infusion    phenylephrine  CONCENTRATED 100mg  in sodium chloride  0.9% (0.4mg /mL) premix infusion    midodrine  (PROAMATINE ) tablet 10 mg    cefTRIAXone  (ROCEPHIN ) 2 g in sodium chloride  0.9 % 100 mL IVPB    linezolid  (ZYVOX ) IVPB 600 mg    midodrine  (PROAMATINE ) tablet 15 mg    phenylephrine  (NEO-SYNEPHRINE) 20mg /NS premix infusion    phenylephrine  CONCENTRATED 100mg  in sodium chloride  0.9% (0.4mg /mL) premix infusion    insulin  glargine-yfgn (SEMGLEE ) injection 25 Units    pantoprazole  (PROTONIX ) EC tablet 40 mg    calcitRIOL (ROCALTROL) capsule 0.25 mcg    potassium PHOSPHATE  15 mmol in dextrose  5 % 250 mL infusion    norepinephrine  (LEVOPHED ) 4mg  in (0.016 mg/mL) premix infusion     Social History:  reports that she has quit smoking. She has never used smokeless tobacco. She reports current alcohol  use. She reports that she does not use drugs.  Family History:   Family History  Problem Relation Age of Onset   Diabetes Mother     Blood pressure 128/77, pulse 100, temperature (!) 97.2 F (36.2 C), temperature source Axillary, resp. rate (!) 23, height (P) 5' 2 (1.575 m), weight 102.8 kg, SpO2 100%. Physical Exam: General appearance: alert, cooperative, and morbidly obese Head: NCAT Eyes: negative Neck: no adenopathy, no carotid bruit Resp: distant BS Cardio: regular rate and rhythm GI: SNDNT +BS Extremities: edema tr Pulses: 2+ and symmetric     Shakenya Stoneberg, LYNWOOD ORN, MD 12/24/2023, 9:01 AM

## 2023-12-24 NOTE — Progress Notes (Signed)
 NAME:  [name] [name], MRN:  [id number], DOB:  Jun 29, 1955, LOS: 25 ADMISSION DATE:  11/29/2023, CONSULTATION DATE:  12/18/23 REFERRING MD:  Fairy Frames, CHIEF COMPLAINT:  diastolic heart failure   History of Present Illness:  68 year old female with known OSA/OHS, HFpEF, pulmonary hypertension, RV failure CKD 4, bedbound x 3 years admitted 10/8 with acute on chronic HFpEF with 2 week history of weight gain, lower extremity edema and SOB.  Her hospital course is complicated by low output state requiring milrinone infusion and lasix  infusion.  Due to ongoing challenges with continued congestion and worsening AKI despite high dose diuretics,  patient was transferred to ICU on 10/27 for Vascath placement and need for CVVHD  RHC 10/22: elevated RA Pressure with normal W.    Pertinent  Medical History  OSA/OHS, poor compliance with CPAP HFpEF Pulmonary hypertension RV failure CKD stage IV Bedbound x 3 years  Significant Hospital Events: Including procedures, antibiotic start and stop dates in addition to other pertinent events   10/8: Admitted with acute on chronic HFpEF with severe pulmonary hypertension-- started on intermittent diuresis 10/9: TTE LVEF 60-65% with RV function mildly reduced, enlarged RV  10/12: milrinone and lasix  infusions started 10/22 RHC: Elevated RA pressure with normal W, consistent with mod-severe PAH. CI 2.91 on milrinone  10/27: HD catheter placed and CRRT started. 10/29 Remains of CRRT for 1 week trial  10/30 patient remains on CRRT, on dobutamine 2.5, also receiving the midodrine  15 mg 3 times daily.  10/31 HF signed off, feel she is euvolemic recommend stopping CRRT.  Interim History / Subjective:  Patient is doing good.  She is continued on CRRT.  Son is in the room he explained to me that the plan is to stop dialysis on Tuesday, 12/26/2023.  No urine output fluid being pulled on CRRT.   Objective    Blood pressure 120/73, pulse (!) 112, temperature  (!) 97.2 F (36.2 C), temperature source Axillary, resp. rate (!) 32, height (P) 5' 2 (1.575 m), weight 102.8 kg, SpO2 93%. CVP:  [0 mmHg-76 mmHg] 4 mmHg      Intake/Output Summary (Last 24 hours) at 12/24/2023 1522 Last data filed at 12/24/2023 1100 Gross per 24 hour  Intake 935.84 ml  Output 2957 ml  Net -2021.16 ml   Filed Weights   12/22/23 0438 12/23/23 0500 12/24/23 0500  Weight: 101.9 kg 101.9 kg 102.8 kg    Examination:  General: Morbidly obese chronically ill appearing female HEENT: [place], AT, PERRL, no JVD Alysis catheter on the right side of neck CV: 1 S2 heard no S3 no murmur Pulm: unlabored, diminished bases Abs: obese, soft, NT Extremities: No acute deformity or edema.  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 If renal function does not recover a palliative regimen is recommended.   AKI on CKD3b  -likely cardiorenal P -undergoing a 7d time trial of CRRT, inititated 10/28   Leukocytosis cellulitis L breast  P -rocephin  linezolid  status post 7-day therapy completed today.  DM2 -CBGs within desired range -  SSI + basal -increased glargine to 15 units today.  Physical deconditioning  Pressure injuries  Suboptimal PO intake/ poor appetite  -WOC recs appreciated  -PT/OT  -encourage PO intake. Defer cortrak w GOC ongoing    GOC -pt is undergoing a time trial of CRRT. She unfortunately is not a good HD candidate. Time trial originally set at 7 days, but the family wanted more time therefore CRRT is prolonged till 12/26/2023.  Plan per the  son is to stop CRRT on 12/26/2023. -Full code  Labs   CBC: Recent Labs  Lab 12/20/23 0439 12/21/23 0451 12/22/23 1024 12/23/23 0518 12/24/23 0442  WBC 20.1* 16.1* 19.7* 18.4* 18.7*  HGB 7.7* 9.3* 10.1* 10.2* 10.9*  HCT 27.3* 32.1* 35.4* 35.5* 37.8  MCV 76.5* 76.4* 77.0* 76.5* 76.1*  PLT 158 164 156 155 134*    Basic Metabolic Panel: Recent Labs  Lab 12/20/23 0439 12/20/23 1624 12/21/23 0451 12/21/23 1600 12/22/23 0433 12/22/23 1611 12/23/23 0518 12/23/23 1651 12/24/23 0442  NA 133*   < > 136  135   < > 133* 138 137 133* 134*  K 4.5   < > 4.1  4.1   < > 4.3 4.2 4.2 4.4 4.5  CL 97*   < > 97*  96*   < > 94* 98 98 95* 95*  CO2 25   < > 25  25   < > 23 24 23 22  21*  GLUCOSE 172*   < > 142*  140*   < > 108* 109* 165* 163* 188*  BUN 25*   < > 8  8   < > 8 8 7* 8 8  CREATININE 1.75*   < > 1.32*  1.35*   < > 1.32* 1.32* 1.29* 1.09* 1.39*  CALCIUM  9.1   < > 9.1  9.0   < > 9.4 9.0 9.3 9.2 9.7  MG 2.4  --  2.2  --  2.5*  --  2.4  --  2.4  PHOS 1.5*   < > 2.4*   < > 2.0* 2.4* 1.8* 2.2* 2.2*   < > = values in this interval not displayed.   GFR: Estimated Creatinine Clearance: 42.7 mL/min (A) (by C-G formula based on SCr of 1.39 mg/dL (H)). Recent Labs  Lab 12/18/23 0230 12/19/23 0430 12/21/23 0451 12/22/23 1024 12/23/23 0518 12/24/23 0442  PROCALCITON 1.00  --   --   --   --   --   WBC 23.7*   < > 16.1* 19.7* 18.4* 18.7*   < > = values in this interval not displayed.    Liver Function Tests: Recent Labs  Lab 12/22/23 0433 12/22/23 1611 12/23/23 0518 12/23/23 1651 12/24/23 0442  ALBUMIN  2.8* 2.7* 2.6* 2.7* 2.9*   No results for input(s): LIPASE, AMYLASE in the last 168 hours. Recent Labs  Lab 12/18/23 1428  AMMONIA 29    ABG    Component Value Date/Time   PHART 7.39 12/05/2023 0752   PCO2ART 58 (H) 12/05/2023 0752   PO2ART <31 (LL) 12/05/2023 0752   HCO3 38.2 (H) 12/13/2023 1523   TCO2 40 (H) 12/13/2023 1523   ACIDBASEDEF 1.6 03/27/2023  2240   O2SAT 73.7 12/24/2023 0442     Coagulation Profile: Recent Labs  Lab 12/18/23 1008  INR 1.2    Cardiac Enzymes: No results for input(s): CKTOTAL, CKMB, CKMBINDEX, TROPONINI in the last 168 hours.  HbA1C: Hgb A1c MFr Bld  Date/Time Value Ref Range Status  11/29/2023 07:02 PM  8.8 (H) 4.8 - 5.6 % Final    Comment:    (NOTE) Diagnosis of Diabetes The following HbA1c ranges recommended by the American Diabetes Association (ADA) may be used as an aid in the diagnosis of diabetes mellitus.  Hemoglobin             Suggested A1C NGSP%              Diagnosis  <5.7                   Non Diabetic  5.7-6.4                Pre-Diabetic  >6.4                   Diabetic  <7.0                   Glycemic control for                       adults with diabetes.    01/15/2023 03:57 AM 9.2 (H) 4.8 - 5.6 % Final    Comment:    (NOTE) Pre diabetes:          5.7%-6.4%  Diabetes:              >6.4%  Glycemic control for   <7.0% adults with diabetes     CBG: Recent Labs  Lab 12/23/23 1315 12/23/23 1656 12/23/23 2111 12/24/23 0637 12/24/23 1143  GLUCAP 173* 155* 158* 176* 209*    CRITICAL CARE Performed by: Tamela Stakes   Total critical care time: 35 minutes  Tamela Stakes, MD  Attending Physician, Critical Care Medicine [facility] See Amion for pager If no response to pager, please call [phone] until 7pm After 7pm, Please call E-link [phone]    12/24/2023 3:22 PM

## 2023-12-24 NOTE — Progress Notes (Signed)
   12/24/23 2337  BiPAP/CPAP/SIPAP  Reason BIPAP/CPAP not in use Non-compliant (Pt states she does not want to wear CPAP tonight)  BiPAP/CPAP /SiPAP Vitals  Pulse Rate (!) 102  Resp 17  SpO2 100 %  MEWS Score/Color  MEWS Score 2  MEWS Score Color Yellow

## 2023-12-24 NOTE — Progress Notes (Signed)
   12/24/23 0033  BiPAP/CPAP/SIPAP  $ Non-Invasive Home Ventilator  Subsequent  BiPAP/CPAP/SIPAP Pt Type Adult  BiPAP/CPAP/SIPAP Resmed  Mask Type Full face mask  Mask Size Large  Set Rate 0 breaths/min  Respiratory Rate 18 breaths/min  IPAP 14 cmH20  EPAP 6 cmH2O  Flow Rate 4 lpm  Patient Home Machine No  Patient Home Mask No  Patient Home Tubing No  Auto Titrate Yes  Minimum cmH2O 14 cmH2O  Maximum cmH2O 6 cmH2O  Device Plugged into RED Power Outlet Yes

## 2023-12-24 NOTE — Progress Notes (Signed)
  Progress Note   Date: 12/20/2023  Patient Name: [name] [name]        MRN#: [id number]   The diagnosis of sepsis    Sepsis supported - please clarify etiology and if any associated organ dysfunction Left breast cellulitis.  Tamela Stakes, MD  Attending Physician, Critical Care Medicine [place] [facility] See Amion for pager If no response to pager, please call [phone] until 7pm After 7pm, Please call E-link [phone]

## 2023-12-25 DIAGNOSIS — Z7189 Other specified counseling: Secondary | ICD-10-CM

## 2023-12-25 DIAGNOSIS — D649 Anemia, unspecified: Secondary | ICD-10-CM

## 2023-12-25 DIAGNOSIS — N179 Acute kidney failure, unspecified: Secondary | ICD-10-CM

## 2023-12-25 DIAGNOSIS — N184 Chronic kidney disease, stage 4 (severe): Secondary | ICD-10-CM

## 2023-12-25 DIAGNOSIS — R739 Hyperglycemia, unspecified: Secondary | ICD-10-CM

## 2023-12-25 DIAGNOSIS — I50813 Acute on chronic right heart failure: Secondary | ICD-10-CM

## 2023-12-25 DIAGNOSIS — E871 Hypo-osmolality and hyponatremia: Secondary | ICD-10-CM

## 2023-12-25 LAB — CBC
HCT: 39.4 % (ref 36.0–46.0)
Hemoglobin: 11.5 g/dL — ABNORMAL LOW (ref 12.0–15.0)
MCH: 22.2 pg — ABNORMAL LOW (ref 26.0–34.0)
MCHC: 29.2 g/dL — ABNORMAL LOW (ref 30.0–36.0)
MCV: 76.2 fL — ABNORMAL LOW (ref 80.0–100.0)
Platelets: 132 K/uL — ABNORMAL LOW (ref 150–400)
RBC: 5.17 MIL/uL — ABNORMAL HIGH (ref 3.87–5.11)
RDW: 22.9 % — ABNORMAL HIGH (ref 11.5–15.5)
WBC: 18.9 K/uL — ABNORMAL HIGH (ref 4.0–10.5)
nRBC: 1.3 % — ABNORMAL HIGH (ref 0.0–0.2)

## 2023-12-25 LAB — RENAL FUNCTION PANEL
Albumin: 3 g/dL — ABNORMAL LOW (ref 3.5–5.0)
Albumin: 2.9 g/dL — ABNORMAL LOW (ref 3.5–5.0)
Anion gap: 16 — ABNORMAL HIGH (ref 5–15)
Anion gap: 17 — ABNORMAL HIGH (ref 5–15)
BUN: 12 mg/dL (ref 8–23)
BUN: 10 mg/dL (ref 8–23)
CO2: 21 mmol/L — ABNORMAL LOW (ref 22–32)
CO2: 19 mmol/L — ABNORMAL LOW (ref 22–32)
Calcium: 10.1 mg/dL (ref 8.9–10.3)
Calcium: 9.5 mg/dL (ref 8.9–10.3)
Chloride: 97 mmol/L — ABNORMAL LOW (ref 98–111)
Chloride: 96 mmol/L — ABNORMAL LOW (ref 98–111)
Creatinine, Ser: 1.16 mg/dL — ABNORMAL HIGH (ref 0.44–1.00)
Creatinine, Ser: 1.09 mg/dL — ABNORMAL HIGH (ref 0.44–1.00)
GFR, Estimated: 51 mL/min — ABNORMAL LOW (ref >60)
GFR, Estimated: 55 mL/min — ABNORMAL LOW (ref >60)
Glucose, Bld: 191 mg/dL — ABNORMAL HIGH (ref 70–99)
Glucose, Bld: 244 mg/dL — ABNORMAL HIGH (ref 70–99)
Phosphorus: 2.2 mg/dL — ABNORMAL LOW (ref 2.5–4.6)
Phosphorus: 4.9 mg/dL — ABNORMAL HIGH (ref 2.5–4.6)
Potassium: 4.3 mmol/L (ref 3.5–5.1)
Potassium: 5.1 mmol/L (ref 3.5–5.1)
Sodium: 134 mmol/L — ABNORMAL LOW (ref 135–145)
Sodium: 132 mmol/L — ABNORMAL LOW (ref 135–145)

## 2023-12-25 LAB — COOXEMETRY PANEL
Carboxyhemoglobin: 2.2 % — ABNORMAL HIGH (ref 0.5–1.5)
Carboxyhemoglobin: 1.8 % — ABNORMAL HIGH (ref 0.5–1.5)
Methemoglobin: <0.7 % (ref 0.0–1.5)
Methemoglobin: 1.7 % — ABNORMAL HIGH (ref 0.0–1.5)
O2 Saturation: 55.9 %
O2 Saturation: 56.9 %
Total hemoglobin: 11.9 g/dL — ABNORMAL LOW (ref 12.0–16.0)
Total hemoglobin: 12 g/dL (ref 12.0–16.0)

## 2023-12-25 LAB — GLUCOSE, CAPILLARY
Glucose-Capillary: 181 mg/dL — ABNORMAL HIGH (ref 70–99)
Glucose-Capillary: 213 mg/dL — ABNORMAL HIGH (ref 70–99)
Glucose-Capillary: 232 mg/dL — ABNORMAL HIGH (ref 70–99)
Glucose-Capillary: 217 mg/dL — ABNORMAL HIGH (ref 70–99)

## 2023-12-25 LAB — MAGNESIUM: Magnesium: 2.6 mg/dL — ABNORMAL HIGH (ref 1.7–2.4)

## 2023-12-25 LAB — APTT: aPTT: 94 s — ABNORMAL HIGH (ref 24–36)

## 2023-12-25 MED ORDER — NOREPINEPHRINE 4 MG/250ML-% IV SOLN: 0.0000 ug/min | INTRAVENOUS | Status: DC

## 2023-12-25 MED ORDER — POTASSIUM PHOSPHATES 15 MMOLE/5ML IV SOLN
30.0000 mmol | Freq: Once | INTRAVENOUS | Status: AC
  Administered 2023-12-25: 30 mmol via INTRAVENOUS
  Filled 2023-12-25: qty 10

## 2023-12-25 MED ORDER — NOREPINEPHRINE 16 MG/250ML-% IV SOLN
0.0000 ug/min | INTRAVENOUS | Status: DC
  Administered 2023-12-26 – 2023-12-27 (×2): 2 ug/min via INTRAVENOUS
  Filled 2023-12-25: qty 250

## 2023-12-25 NOTE — Progress Notes (Signed)
 Daily Progress Note   Patient Name: [name] [name]       Date: 12/25/2023 DOB: May 09, 1955  Age: 68 y.o. MRN#: [id number] Attending Physician: Gretta Leita SQUIBB, DO Primary Care Physician: [facility] Admit Date: 11/29/2023  Reason for Consultation/Follow-up: Establishing goals of care  Subjective: Medical records reviewed including progress notes, labs and imaging. Patient assessed at the bedside.  She denies pain, distress, or any other complaints.  Her sister and friend are at the bedside disc.  Dr. Gretta present to provide medical update.  Discussed with her independently afterwards.  Created space and opportunity for patient and family's thoughts and feelings on patient's current illness.  Patient does not participate in discussions or acknowledge much besides very direct questions to her.  Her sister Orlean reflected on Saturday's goals of care discussion and the unfortunate lack of options after completion of 1 week trial of CRRT tomorrow.  She notes that she has a doctor's appointment tomorrow morning, but she wishes to attend the family meeting to discuss comfort care.  She would be available between 1030 and 11 AM notes that [name] should be available around this time as well.  She is not sure about patient's son Francis or his availability.  I attempted to call patient's son Francis to schedule family meeting but was unable to reach.  Voicemail with PMT contact information was provided. Requested a return call to confirm his availability 11/4 at 10:30 am.   Called patient's son again later in the afternoon and he has requested meeting for 12:30 PM tomorrow.  Requested that he inform patient's sisters accordingly.  Called sister Orlean but was unable to reach. Unable to leave voicemail as her box was full. Called  patient's [name] and provided update on time for tomorrow's meeting. She will not be able to attend in person but would like to be involved via conference call.  Questions and concerns addressed. PMT will continue to support holistically.   Length of Stay: 26   Physical Exam Vitals and nursing note reviewed.  Constitutional:      General: She is not in acute distress.    Appearance: She is ill-appearing.  HENT:     Head: Normocephalic and atraumatic.  Cardiovascular:     Rate and Rhythm: Normal rate.  Pulmonary:     Effort: Pulmonary effort is normal.  Neurological:     Mental Status: She is alert.  Psychiatric:        Mood and Affect: Mood normal.        Behavior: Behavior normal.  Vital Signs: BP 101/75   Pulse (!) 106   Temp 97.7 F (36.5 C) (Oral)   Resp (!) 23   Ht (P) 5' 2 (1.575 m)   Wt 104.4 kg   SpO2 100%   BMI (P) 42.10 kg/m  SpO2: SpO2: 100 % O2 Device: O2 Device: Nasal Cannula O2 Flow Rate: O2 Flow Rate (L/min): 4 L/min      Palliative Assessment/Data: 30% at best   Palliative Care Assessment & Plan   Patient Profile: 68 y.o. female  with past medical history of  insulin -dependent type 2 diabetes, chronic heart failure with preserved EF, morbid obesity, bedbound, pulmonary hypertension admitted on 11/29/2023 with edema and weight gain.    Patient was admitted for acute exacerbation of chronic heart failure with preserved EF.  Her hospital course is complicated by low output state requiring milrinone infusion and lasix  infusion, as well as worsening AKI with Cr of 5.26 increased today from 4.3. Requiring transfer to ICU for CRRT. PMT has been consulted to assist with goals of care conversation.  Assessment: Goals of care conversation Acute on chronic RV failure AKI on CKD3b, now on CRRT without improvement Cardiorenal syndrome/ATN Pulmonary hypertension, severe Concern for sepsis  Acute on chronic respiratory  failure OSA/OHS   Recommendations/Plan: Continue full code/full scope treatment Family meeting with patient's son scheduled for 11/4 at 12:30 PM to discuss shift to comfort focused care and discontinuation of CRRT  Prognosis: Very poor  Discharge Planning: To Be Determined   Care plan was discussed with patient, patient's 2 sisters, friend, son, primary attending         Karson Reede P Jarel Cuadra, PA-C  Palliative Medicine Team Team phone # [phone]  Thank you for allowing the Palliative Medicine Team to assist in the care of this patient. Please utilize secure chat with additional questions, if there is no response within 30 minutes please call the above phone number.  Palliative Medicine Team providers are available by phone from 7am to 7pm daily and can be reached through the team cell phone.  Should this patient require assistance outside of these hours, please call the patient's attending physician.    Time Total: 35  Visit consisted of counseling and education dealing with the complex and emotionally intense issues of symptom management and palliative care in the setting of serious and potentially life-threatening illness. Greater than 50% of this time was spent counseling and coordinating care related to the above assessment and plan.  Personally spent 35 minutes in patient care including extensive chart review (labs, imaging, progress/consult notes, vital signs), medically appropraite exam, discussed with treatment team, education to patient, family, and staff, documenting clinical information, medication review and management, coordination of care, and available advanced directive documents.

## 2023-12-25 NOTE — Progress Notes (Signed)
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  (NEO-SYNEPHRINE) 20mg /NS 250mL premix infusion    phenylephrine  CONCENTRATED 100mg  in sodium chloride  0.9% (0.4mg /mL) premix infusion    midodrine  (PROAMATINE ) tablet 10 mg    cefTRIAXone  (ROCEPHIN ) 2 g in sodium chloride  0.9 % 100 mL IVPB    linezolid  (ZYVOX ) IVPB 600 mg    midodrine  (PROAMATINE ) tablet 15 mg    phenylephrine  (NEO-SYNEPHRINE)  20mg /NS 250mL premix infusion    phenylephrine  CONCENTRATED 100mg  in sodium chloride  0.9% (0.4mg /mL) premix infusion    insulin  glargine-yfgn (SEMGLEE ) injection 25 Units    pantoprazole  (PROTONIX ) EC tablet 40 mg    calcitRIOL (ROCALTROL) capsule 0.25 mcg    potassium PHOSPHATE  15 mmol in dextrose  5 % 250 mL infusion    norepinephrine  (LEVOPHED ) 4mg  in (0.016 mg/mL) premix infusion    famotidine  (PEPCID ) tablet 20 mg    insulin  glargine-yfgn (SEMGLEE ) injection 10 Units     Social History:  reports that she has quit smoking. She has never used smokeless tobacco. She reports current alcohol  use. She reports that she does not use drugs.  Family History:   Family History  Problem Relation Age of Onset   Diabetes Mother     Blood pressure 101/75, pulse (!) 106, temperature 97.7 F (36.5 C), temperature source Oral, resp. rate (!) 23, height (P) 5' 2 (1.575 m), weight 104.4 kg, SpO2 100%. Physical Exam: General appearance: alert, cooperative, and morbidly obese Head: NCAT Eyes: negative Neck: no adenopathy, no carotid bruit Resp: distant BS Cardio: regular rate and rhythm GI: SNDNT +BS Extremities: edema tr Pulses: 2+ and symmetric     Zebulin Siegel, LYNWOOD ORN, MD 12/25/2023, 8:56 AM

## 2023-12-25 NOTE — Progress Notes (Signed)
 NAME:  [name] [name], MRN:  [id number], DOB:  1955-11-13, LOS: 26 ADMISSION DATE:  11/29/2023, CONSULTATION DATE:  12/18/23 REFERRING MD:  Fairy Frames, CHIEF COMPLAINT:  diastolic heart failure   History of Present Illness:  68 year old female with known OSA/OHS, HFpEF, pulmonary hypertension, RV failure CKD 4, bedbound x 3 years admitted 10/8 with acute on chronic HFpEF with 2 week history of weight gain, lower extremity edema and SOB.  Her hospital course is complicated by low output state requiring milrinone infusion and lasix  infusion.  Due to ongoing challenges with continued congestion and worsening AKI despite high dose diuretics,  patient was transferred to ICU on 10/27 for Vascath placement and need for CVVHD  RHC 10/22: elevated RA Pressure with normal W.    Pertinent  Medical History  OSA/OHS, poor compliance with CPAP HFpEF Pulmonary hypertension RV failure CKD stage IV Bedbound x 3 years  Significant Hospital Events: Including procedures, antibiotic start and stop dates in addition to other pertinent events   10/8: Admitted with acute on chronic HFpEF with severe pulmonary hypertension-- started on intermittent diuresis 10/9: TTE LVEF 60-65% with RV function mildly reduced, enlarged RV  10/12: milrinone and lasix  infusions started 10/22 RHC: Elevated RA pressure with normal W, consistent with mod-severe PAH. CI 2.91 on milrinone  10/27: HD catheter placed and CRRT started. 10/29 Remains of CRRT for 1 week trial  10/30 patient remains on CRRT, on dobutamine 2.5, also receiving the midodrine  15 mg 3 times daily.  10/31 HF signed off, feel she is euvolemic recommend stopping CRRT.  Interim History / Subjective:  Remains on CRRT and 2.5 DBA. On 4L nasal cannula.   Objective    Blood pressure (!) 78/52, pulse (!) 104, temperature (!) 97.5 F (36.4 C), temperature source Oral, resp. rate (!) 30, height (P) 5' 2 (1.575 m), weight 104.4 kg, SpO2 99%. CVP:  [0  mmHg-70 mmHg] 4 mmHg      Intake/Output Summary (Last 24 hours) at 12/25/2023 1144 Last data filed at 12/25/2023 0900 Gross per 24 hour  Intake 587.07 ml  Output 1335.8 ml  Net -748.73 ml   Filed Weights   12/23/23 0500 12/24/23 0500 12/25/23 0600  Weight: 101.9 kg 102.8 kg 104.4 kg    Examination:  General: Chronically ill appearing adult female lying in bed no distress  HEENT: Vascath to right IJ CV: RRR, no mRG Pulm: Diminished/distant breath sounds, no use of accessory muscles  Abs: soft, distended, active bowel sounds, bruising noted to lower ABD  Extremities: No acute deformity or edema.  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 If renal function does not recover a palliative regimen is recommended.   AKI on CKD3b in setting of cardiorenal Plan -Nephrology following -not candidate for long term dialysis -Plan to stop CRRT 11/4 > Palliative care following  -Trend BMP, Mag, Phos   Leukocytosis, Afebrile  Cellulitis L breast  -Completed  7 days of Rocephin /Linezolid  11/2  Plan  -Trend WBC and Fever Curve   DM2 Plan -Trend Glucose  -SSI ACHS -Semglee  15 units daily   Physical deconditioning  Pressure injuries  Suboptimal PO intake/ poor appetite  -WOC recs appreciated  -PT/OT  -encourage PO intake. Defer cortrak w GOC ongoing    GOC -Palliative Care following, plan for  additional meeting 11/4  -Full code  Labs   CBC: Recent Labs  Lab 12/21/23 0451 12/22/23 1024 12/23/23 0518 12/24/23 0442 12/25/23 0432  WBC 16.1* 19.7* 18.4* 18.7* 18.9*  HGB 9.3* 10.1* 10.2* 10.9* 11.5*  HCT 32.1* 35.4* 35.5* 37.8 39.4  MCV 76.4* 77.0* 76.5* 76.1* 76.2*  PLT 164 156 155 134* 132*    Basic Metabolic Panel: Recent Labs  Lab 12/21/23 0451 12/21/23 1600 12/22/23 0433 12/22/23 1611 12/23/23 0518 12/23/23 1651 12/24/23 0442 12/24/23 1600 12/25/23 0432  NA 136  135   < > 133*   < > 137 133* 134* 136 134*  K 4.1  4.1   < > 4.3   < > 4.2 4.4 4.5 4.5 4.3  CL 97*  96*   < > 94*   < > 98 95* 95* 96* 97*  CO2 25  25   < > 23   < > 23 22 21* 22 21*  GLUCOSE 142*  140*   < > 108*   < > 165* 163* 188* 228* 191*  BUN 8  8   < > 8   < > 7* 8 8 14 12   CREATININE 1.32*  1.35*   < > 1.32*   < > 1.29* 1.09* 1.39* 1.30* 1.16*  CALCIUM  9.1  9.0   < > 9.4   < > 9.3 9.2 9.7 9.7 10.1  MG 2.2  --  2.5*  --  2.4  --  2.4  --  2.6*  PHOS 2.4*   < > 2.0*   < > 1.8* 2.2* 2.2* 3.3 2.2*   < > = values in this interval not displayed.   GFR: Estimated Creatinine Clearance: 51.6 mL/min (A) (by C-G formula based on SCr of 1.16 mg/dL (H)). Recent Labs  Lab 12/22/23 1024 12/23/23 0518 12/24/23 0442 12/25/23 0432  WBC 19.7* 18.4* 18.7* 18.9*    Liver Function Tests: Recent Labs  Lab 12/23/23 0518 12/23/23 1651 12/24/23 0442 12/24/23 1600 12/25/23 0432  ALBUMIN  2.6* 2.7* 2.9* 3.0* 3.0*   No results for input(s): LIPASE, AMYLASE in the last 168 hours. Recent Labs  Lab 12/18/23 1428  AMMONIA 29    ABG    Component Value Date/Time   PHART 7.39 12/05/2023 0752   PCO2ART 58 (H) 12/05/2023 0752   PO2ART <31 (LL) 12/05/2023 0752   HCO3 38.2 (H) 12/13/2023 1523   TCO2 40 (H) 12/13/2023 1523   ACIDBASEDEF 1.6 03/27/2023 2240   O2SAT 55.9 12/25/2023 0432     Coagulation Profile: No results for input(s): INR, PROTIME in the last 168  hours.   Cardiac Enzymes: No results for input(s): CKTOTAL, CKMB, CKMBINDEX, TROPONINI in the last 168 hours.  HbA1C: Hgb A1c MFr Bld  Date/Time Value Ref Range Status  11/29/2023 07:02 PM 8.8 (H) 4.8 - 5.6 % Final    Comment:    (NOTE) Diagnosis of Diabetes The following HbA1c ranges recommended by the American Diabetes Association (ADA) may be used as an aid in the diagnosis of diabetes mellitus.  Hemoglobin             Suggested  A1C NGSP%              Diagnosis  <5.7                   Non Diabetic  5.7-6.4                Pre-Diabetic  >6.4                   Diabetic  <7.0                   Glycemic control for                       adults with diabetes.    01/15/2023 03:57 AM 9.2 (H) 4.8 - 5.6 % Final    Comment:    (NOTE) Pre diabetes:          5.7%-6.4%  Diabetes:              >6.4%  Glycemic control for   <7.0% adults with diabetes     CBG: Recent Labs  Lab 12/24/23 1143 12/24/23 1603 12/24/23 2101 12/25/23 0620 12/25/23 1112  GLUCAP 209* 228* 171* 181* 213*     CRITICAL CARE Performed by: Dennis CHRISTELLA An   Total critical care time: 35 minutes  Critical care time was exclusive of separately billable procedures and treating other patients.  Critical care was necessary to treat or prevent imminent or life-threatening deterioration.  Critical care was time spent personally by me on the following activities: development of treatment plan with patient and/or surrogate as well as nursing, discussions with consultants, evaluation of patient's response to treatment, examination of patient, obtaining history from patient or surrogate, ordering and performing treatments and interventions, ordering and review of laboratory studies, ordering and review of radiographic studies, pulse oximetry and re-evaluation of patient's condition.   Dennis An, AGACNP-BC [facility]  PCCM Pgr: [phone]

## 2023-12-25 NOTE — Progress Notes (Signed)
 Pt's CVP have been 0-3 with soft pressure. I have kept her even on CRRT since 1600, as indicated by RN upon shift change at 1500 who got orders from Nephro.

## 2023-12-25 NOTE — Progress Notes (Signed)
 eLink Physician-Brief Progress Note Patient Name: [name] [name] DOB: Jun 24, 1955 MRN: [id number]   Date of Service  12/25/2023  HPI/Events of Note  68 year old female presents with refractory RV failure, renal failure on CRRT, with intermittent hypotension on a dobutamine infusion.  Given failure to improve, pending goals of care conversation 11/4.  Notified of refractory hypotension in the setting of multiorgan dysfunction.  Previously on phenylephrine -discontinued.  eICU Interventions  Low-dose norepinephrine  added to maintain MAP greater than 65     Intervention Category Intermediate Interventions: Hypotension - evaluation and management  Ramia Sidney 12/25/2023, 11:38 PM

## 2023-12-26 DIAGNOSIS — Z7189 Other specified counseling: Secondary | ICD-10-CM

## 2023-12-26 DIAGNOSIS — I1310 Hypertensive heart and chronic kidney disease without heart failure, with stage 1 through stage 4 chronic kidney disease, or unspecified chronic kidney disease: Secondary | ICD-10-CM

## 2023-12-26 LAB — RENAL FUNCTION PANEL
Albumin: 3 g/dL — ABNORMAL LOW (ref 3.5–5.0)
Albumin: 2.9 g/dL — ABNORMAL LOW (ref 3.5–5.0)
Anion gap: 16 — ABNORMAL HIGH (ref 5–15)
Anion gap: 14 (ref 5–15)
BUN: 13 mg/dL (ref 8–23)
BUN: 23 mg/dL (ref 8–23)
CO2: 20 mmol/L — ABNORMAL LOW (ref 22–32)
CO2: 21 mmol/L — ABNORMAL LOW (ref 22–32)
Calcium: 10.5 mg/dL — ABNORMAL HIGH (ref 8.9–10.3)
Calcium: 10.9 mg/dL — ABNORMAL HIGH (ref 8.9–10.3)
Chloride: 98 mmol/L (ref 98–111)
Chloride: 95 mmol/L — ABNORMAL LOW (ref 98–111)
Creatinine, Ser: 1.41 mg/dL — ABNORMAL HIGH (ref 0.44–1.00)
Creatinine, Ser: 2.41 mg/dL — ABNORMAL HIGH (ref 0.44–1.00)
GFR, Estimated: 40 mL/min — ABNORMAL LOW (ref >60)
GFR, Estimated: 21 mL/min — ABNORMAL LOW
Glucose, Bld: 184 mg/dL — ABNORMAL HIGH (ref 70–99)
Glucose, Bld: 250 mg/dL — ABNORMAL HIGH (ref 70–99)
Phosphorus: 2.9 mg/dL (ref 2.5–4.6)
Phosphorus: 2.7 mg/dL (ref 2.5–4.6)
Potassium: 4.2 mmol/L (ref 3.5–5.1)
Potassium: 4.5 mmol/L (ref 3.5–5.1)
Sodium: 134 mmol/L — ABNORMAL LOW (ref 135–145)
Sodium: 130 mmol/L — ABNORMAL LOW (ref 135–145)

## 2023-12-26 LAB — MAGNESIUM: Magnesium: 2.5 mg/dL — ABNORMAL HIGH (ref 1.7–2.4)

## 2023-12-26 LAB — CBC
HCT: 38.3 % (ref 36.0–46.0)
Hemoglobin: 11.3 g/dL — ABNORMAL LOW (ref 12.0–15.0)
MCH: 22.2 pg — ABNORMAL LOW (ref 26.0–34.0)
MCHC: 29.5 g/dL — ABNORMAL LOW (ref 30.0–36.0)
MCV: 75.2 fL — ABNORMAL LOW (ref 80.0–100.0)
Platelets: 102 K/uL — ABNORMAL LOW (ref 150–400)
RBC: 5.09 MIL/uL (ref 3.87–5.11)
RDW: 22.5 % — ABNORMAL HIGH (ref 11.5–15.5)
WBC: 16.8 K/uL — ABNORMAL HIGH (ref 4.0–10.5)
nRBC: 2.8 % — ABNORMAL HIGH (ref 0.0–0.2)

## 2023-12-26 LAB — GLUCOSE, CAPILLARY
Glucose-Capillary: 207 mg/dL — ABNORMAL HIGH (ref 70–99)
Glucose-Capillary: 221 mg/dL — ABNORMAL HIGH (ref 70–99)
Glucose-Capillary: 222 mg/dL — ABNORMAL HIGH (ref 70–99)
Glucose-Capillary: 172 mg/dL — ABNORMAL HIGH (ref 70–99)

## 2023-12-26 LAB — COOXEMETRY PANEL
Carboxyhemoglobin: 2.6 % — ABNORMAL HIGH (ref 0.5–1.5)
Methemoglobin: <0.7 % (ref 0.0–1.5)
O2 Saturation: 54.8 %
Total hemoglobin: 11.8 g/dL — ABNORMAL LOW (ref 12.0–16.0)

## 2023-12-26 LAB — APTT: aPTT: 46 s — ABNORMAL HIGH (ref 24–36)

## 2023-12-26 MED ORDER — MIDODRINE HCL 5 MG PO TABS
20.0000 mg | ORAL_TABLET | Freq: Three times a day (TID) | ORAL | Status: DC
  Administered 2023-12-26 – 2023-12-28 (×5): 20 mg via ORAL
  Filled 2023-12-26 (×6): qty 4

## 2023-12-26 MED ORDER — INSULIN GLARGINE-YFGN 100 UNIT/ML ~~LOC~~ SOLN
18.0000 [IU] | Freq: Every day | SUBCUTANEOUS | Status: DC
  Administered 2023-12-28: 18 [IU] via SUBCUTANEOUS
  Filled 2023-12-26 (×2): qty 0.18

## 2023-12-26 NOTE — TOC Progression Note (Addendum)
 Transition of Care [facility]) - Progression Note    Patient Details  Name: [name] [name] MRN: [id number] Date of Birth: [birth year]  [facility] CM/SW Contact  [name], RN Phone Number: [phone] 12/26/2023, 9:54 AM  Clinical Narrative:    Now on CRRT, IV abx, and Dobutamine gtt, palliative team following for GOC. Pt was scheduled dc home with [facility] and home health aides.  Waiting final recommendations from team about disposition.  Pt states she lives at home with help of [facility] Aides that come from 9-12 and 6-8 pm arranged by [place]. [facility] comes [facility] 3 months for assessment/follow up. HH arranged with [facility]. Rep, Arna following.  Chart reviewed for discharge readiness, patient not medically stable for d/c. Inpatient CM/CSW will continue to monitor pt's advancement through interdisciplinary progression rounds.   If new pt transition needs arise, MD please place a TOC consult.     Expected Discharge Plan: Home w Home Health Services Barriers to Discharge: Continued Medical Work up   Expected Discharge Plan and Services In-house Referral: NA Discharge Planning Services: CM Consult Post Acute Care Choice: Home Health Living arrangements for the past 2 months: Apartment                 DME Arranged: N/A DME Agency: NA       HH Arranged: RN HH Agency: NA         Social Drivers of Health (SDOH) Interventions SDOH Screenings   Food Insecurity: No Food Insecurity (11/29/2023)  Housing: Low Risk  (11/29/2023)  Transportation Needs: No Transportation Needs (11/29/2023)  Utilities: Not At Risk (11/29/2023)  Alcohol  Screen: Low Risk  (03/29/2023)  Depression (PHQ2-9): Low Risk  (03/29/2023)  Financial Resource Strain: Patient Declined (05/16/2022)   Received from [facility]  Physical Activity: Inactive (03/29/2023)  Social Connections: Unknown (12/01/2023)  Stress: No Stress Concern Present (05/16/2022)   Received from [facility]   Tobacco Use: Medium Risk (12/01/2023)    Readmission Risk Interventions    12/02/2023   11:20 AM 03/28/2023   11:29 AM 02/23/2023   11:48 AM  Readmission Risk Prevention Plan  Transportation Screening Complete Complete Complete  PCP or Specialist Appt within 3-5 Days   Complete  HRI or Home Care Consult   Complete  Social Work Consult for Recovery Care Planning/Counseling   Complete  Palliative Care Screening   Not Applicable  Medication Review Oceanographer) Complete Complete Complete  PCP or Specialist appointment within 3-5 days of discharge Complete    HRI or Home Care Consult Complete Complete   SW Recovery Care/Counseling Consult Complete Complete   Palliative Care Screening Not Applicable Not Applicable   Skilled Nursing Facility Not Applicable Not Applicable

## 2023-12-26 NOTE — Progress Notes (Signed)
 Daily Progress Note   Patient Name: [name] [name]       Date: 12/26/2023 DOB: 07-13-1955  Age: 68 y.o. MRN#: [id number] Attending Physician: Gretta Leita SQUIBB, DO Primary Care Physician: [facility] Admit Date: 11/29/2023  Reason for Consultation/Follow-up: Establishing goals of care  Subjective: Medical records reviewed including progress notes, labs and imaging. Patient assessed at the bedside. She denies symptoms or any other concerns. Her son Francis, niece Kerri, aunt Orlean, daughter-in-law, relative Bennet are present in person. [name], nephew, and another relative Tierra participated via phone for today's scheduled family meeting.  Created space and opportunity for patient and family's thoughts and feelings on patient's current illness. We reviewed course of hospitalization over the last week and lack of any other options prolong her life. Family understandable had a hard time accepting this, asking about peritoneal dialysis or other procedures to improve patient's heart function. Provided extensive education on patient's disease process and how all options have been exhausted, alongside PCCM PA and physician.  Recommended comfort focused care, reviewing that patient would no longer receive aggressive medical interventions such as continuous vital signs, lab work, radiology testing, or medications not focused on comfort. All care would focus on how the patient is looking and feeling. This would include management of any symptoms that may cause discomfort, pain, shortness of breath, cough, nausea, agitation, anxiety, and/or secretions etc. Symptoms would be managed with medications and other non-pharmacological interventions such as spiritual support if requested, repositioning, music therapy, or therapeutic listening.  Family verbalized understanding. They are not ready to make this decision today. Patient herself also noted her understanding that if my kidneys don't recover then I am out of here. She wishes to pray on her decisions for code status and focus of her care. Chaplain arrived for prayer and I stayed for emotional support and therapeutic listening.   Questions and concerns addressed. PMT will continue to support holistically.   Length of Stay: 54   Physical Exam Vitals and nursing note reviewed.  Constitutional:      General: She is not in acute distress.    Appearance: She is ill-appearing.  HENT:     Head: Normocephalic and atraumatic.  Cardiovascular:     Rate and Rhythm: Normal rate.  Pulmonary:     Effort: Pulmonary effort is normal.  Neurological:     Mental Status: She is alert.  Psychiatric:        Mood and Affect: Mood normal.        Behavior: Behavior normal.            Vital Signs:  BP (!) 96/56   Pulse 92   Temp 98.3 F (36.8 C) (Oral)   Resp (!) 28   Ht (P) 5' 2 (1.575 m)   Wt 102.8 kg   SpO2 95%   BMI (P) 41.45 kg/m  SpO2: SpO2: 95 % O2 Device: O2 Device: Room Air O2 Flow Rate: O2 Flow Rate (L/min): 4 L/min      Palliative Assessment/Data: 30% at best   Palliative Care Assessment & Plan   Patient Profile: 68 y.o. female  with past medical history of  insulin -dependent type 2 diabetes, chronic heart failure with preserved EF, morbid obesity, bedbound, pulmonary hypertension admitted on 11/29/2023 with edema and weight gain.    Patient was admitted for acute exacerbation of chronic heart failure with preserved EF.  Her hospital course is complicated by low output state requiring milrinone infusion and lasix  infusion, as well as worsening AKI with Cr of 5.26 increased today from 4.3. Requiring transfer to ICU for CRRT. PMT has been consulted to assist with goals of care conversation.  Assessment: Goals of care conversation Acute on chronic RV failure AKI on  CKD3b, now on CRRT without improvement Cardiorenal syndrome/ATN Pulmonary hypertension, severe Concern for sepsis  Acute on chronic respiratory failure OSA/OHS   Recommendations/Plan: Continue full code while patient prays on her preference. She verbalized her understanding of recommendation for DNR/DNI Continue current care plan. Patient's family not ready for final decision today Patient's son will ask his questions regarding PD at his own HD session tomorrow and continue GOC discussion afterwards Psychocial and emotional support provided  Prognosis: Terminal   Discharge Planning: To Be Determined   Care plan was discussed with patient, family as stated above, RN, PCCM PA, primary attending         Janalynn Eder [name], PA-C  Palliative Medicine Team Team phone # [phone]  Thank you for allowing the Palliative Medicine Team to assist in the care of this patient. Please utilize secure chat with additional questions, if there is no response within 30 minutes please call the above phone number.  Palliative Medicine Team providers are available by phone from 7am to 7pm daily and can be reached through the team cell phone.  Should this patient require assistance outside of these hours, please call the patient's attending physician.    Time Total: 95  Visit consisted of counseling and education dealing with the complex and emotionally intense issues of symptom management and palliative care in the setting of serious and potentially life-threatening illness. Greater than 50% of this time was spent counseling and coordinating care related to the above assessment and plan.  Personally spent 95 minutes in patient care including extensive chart review (labs, imaging, progress/consult notes, vital signs), medically appropraite exam, discussed with treatment team, education to patient, family, and staff, documenting clinical information, medication review and management, coordination of  care, and available advanced directive documents.

## 2023-12-26 NOTE — Progress Notes (Signed)
   12/26/23 1422  Spiritual Encounters  Type of Visit Initial  Care provided to: Patient;Family;Pt and family  Conversation partners present during Programmer, Systems  Referral source Nurse (RN/NT/LPN)  Reason for visit End-of-life  OnCall Visit No   Chaplain responded to nurse page, about family member in distress, offered prayer and consultation to the patient and family.

## 2023-12-26 NOTE — Inpatient Diabetes Management (Signed)
 Inpatient Diabetes Program Recommendations  AACE/ADA: New Consensus Statement on Inpatient Glycemic Control (2015)  Target Ranges:  Prepandial:   less than 140 mg/dL      Peak postprandial:   less than 180 mg/dL (1-2 hours)      Critically ill patients:  140 - 180 mg/dL   Lab Results  Component Value Date   GLUCAP 207 (H) 12/26/2023   HGBA1C 8.8 (H) 11/29/2023    Latest Reference Range & Units 12/25/23 06:20 12/25/23 11:12 12/25/23 16:11 12/25/23 21:32 12/26/23 06:11  Glucose-Capillary 70 - 99 mg/dL 818 (H) 786 (H) 767 (H) 217 (H) 207 (H)  (H): Data is abnormally high  Diabetes history: DM2 Outpatient Diabetes medications:  Lantus  50 units QAM, Ozempic 2 mg weekly  Current orders for Inpatient glycemic control: Lantus  15 units am, Novolog  0-20 TID with meals and 0-5 HS   Inpatient Diabetes Program Recommendations:  Noted postprandial CBGs elevated  Please consider: -Decrease Novolog  correction to 0-15 units tid, 0-5 units hs -Add Novolog  3 units tid meal coverage if eats 50% meal  Thank you, Dagoberto E. Annette Liotta, RN, MSN, CNS, CDCES  Diabetes Coordinator Inpatient Glycemic Control Team Team Pager [phone] (8am-5pm) 12/26/2023 9:09 AM

## 2023-12-26 NOTE — IPAL (Addendum)
  Interdisciplinary Goals of Care Family Meeting   Date carried out: 12/26/2023  Location of the meeting: Bedside  Member's involved: Physician, Family Member or next of kin, and Palliative care team member  Durable Power of Attorney or acting medical decision maker: son, [name] [name]     Discussion: We discussed goals of care for [name] [name] . At bedside was palliative PA [name] [name], myself, Dr. Gretta PCCM Attending, son [name] [name], sister, and niece as well as family members on the phone. We reviewed hospitalization, 1 week trial of CRRT which stopped this morning. Discussed that she continues to be anuric with no renal recovery and is not a long-term dialysis candidate. We discussed her ongoing heart failure and is inotrope dependent at this point on 2.5 dobutamine and is off/on levophed . Her son, [name] [name], has asked repeatedly about peritoneal dialysis. We discussed she is not a candidate for this. He would like to speak with his dialysis center tomorrow about this. Additionally, family members feel this has been sudden; however, they recognize that [name] [name] has always told them everything was fine. We reviewed the difficulty between the heart failure and renal failure, that this has caused her long term slowing down, bed bound status and lack of appetite. Additionally, we spoke about possible hospice options including in-unit comfort measures, in-hospital hospice, outside facilities or home hospice. They wanted to leave it to [name] [name] to make decision about DNR status. She wants to pray on this today. We emphasized how sudden clinical status can change and that at any time her heart may stop or she otherwise decompensates. They verbalized understanding.   Code status:   Code Status: Full Code   Disposition: Continue current acute care  Time spent for the meeting: 45   [name] FORBES Furth, PA-C [facility] 12/26/23 2:42 PM  Please see [email or web address] for pager  details.  From 7A-7P if no response, please call [phone] After hours, please call Ema [phone]     I participated in a portion of this discussion with patient, sister, son, niece who is a primary caregiver, DIL, 2 family members on the phone. I answered questions about her care over the last week since she was upgraded to ICU. We reviewed her chronic course of disease and the more acute decompensation. All questions were answered.   We reviewed both what comfort care and staying full code would look like. Ms. Spring wants time to pray on this decision. Her family feels as though this is so sudden because they were not aware of how chronic her heart failure had been and the decline over time.   [name] SHAUNNA Gretta, DO 12/26/23 5:52 PM [facility]  For contact information, see Amion. If no response to pager, please call PCCM consult pager. After hours, 7PM- 7AM, please call Elink.

## 2023-12-26 NOTE — Progress Notes (Signed)
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  (PROAMATINE ) tablet 5 mg    insulin  aspart (novoLOG ) injection 3 Units    insulin  glargine-yfgn (SEMGLEE ) injection 35 Units    oxyCODONE  (Oxy IR/ROXICODONE ) immediate release tablet 10 mg    milrinone (PRIMACOR) 20 MG/100 ML (0.2 mg/mL) infusion    liver oil-zinc  oxide (DESITIN) 40 % ointment    phenylephrine  (NEO-SYNEPHRINE) 20mg /NS  250mL premix infusion    phenylephrine  CONCENTRATED 100mg  in sodium chloride  0.9% (0.4mg /mL) premix infusion    midodrine  (PROAMATINE ) tablet 10 mg    cefTRIAXone  (ROCEPHIN ) 2 g in sodium chloride  0.9 % 100 mL IVPB    linezolid  (ZYVOX ) IVPB 600 mg    midodrine  (PROAMATINE ) tablet 15 mg    phenylephrine  (NEO-SYNEPHRINE) 20mg /NS premix infusion    phenylephrine  CONCENTRATED 100mg  in sodium chloride  0.9% (0.4mg /mL) premix infusion    insulin  glargine-yfgn (SEMGLEE ) injection 25 Units    pantoprazole  (PROTONIX ) EC tablet 40 mg    calcitRIOL (ROCALTROL) capsule 0.25 mcg    potassium PHOSPHATE  15 mmol in dextrose  5 % 250 mL infusion    norepinephrine  (LEVOPHED ) 4mg  in (0.016 mg/mL) premix infusion    famotidine  (PEPCID ) tablet 20 mg    insulin  glargine-yfgn (SEMGLEE ) injection 10 Units    norepinephrine  (LEVOPHED ) 4mg  in (0.016 mg/mL) premix infusion     Social History:  reports that she has quit smoking. She has never used smokeless tobacco. She reports current alcohol  use. She reports that she does not use drugs.  Family History:   Family History  Problem Relation Age of Onset   Diabetes Mother     Blood pressure (!) 96/56, pulse 92, temperature 98.3 F (36.8 C), temperature source Oral, resp. rate (!) 28, height (P) 5' 2 (1.575 m), weight 102.8 kg, SpO2 95%. Physical Exam: General appearance: alert, cooperative, and morbidly obese Head: NCAT Eyes: negative Neck: no adenopathy, no carotid bruit Resp: distant BS Cardio: regular rate and rhythm GI: SNDNT +BS Extremities: edema tr Pulses: 2+ and symmetric     Wahneta Derocher, LYNWOOD ORN, MD 12/26/2023, 9:43 AM

## 2023-12-26 NOTE — Progress Notes (Signed)
 Patient refused her CPAP. Nurse is aware.

## 2023-12-26 NOTE — Progress Notes (Addendum)
 NAME:  [name] [name], MRN:  [id number], DOB:  January 20, 1956, LOS: 27 ADMISSION DATE:  11/29/2023, CONSULTATION DATE:  12/18/23 REFERRING MD:  Fairy Frames, CHIEF COMPLAINT:  diastolic heart failure   History of Present Illness:  68 year old female with known OSA/OHS, HFpEF, pulmonary hypertension, RV failure CKD 4, bedbound x 3 years admitted 10/8 with acute on chronic HFpEF with 2 week history of weight gain, lower extremity edema and SOB.  Her hospital course is complicated by low output state requiring milrinone infusion and lasix  infusion.  Due to ongoing challenges with continued congestion and worsening AKI despite high dose diuretics,  patient was transferred to ICU on 10/27 for Vascath placement and need for CVVHD  RHC 10/22: elevated RA Pressure with normal W.    Pertinent  Medical History  OSA/OHS, poor compliance with CPAP HFpEF Pulmonary hypertension RV failure CKD stage IV Bedbound x 3 years  Significant Hospital Events: Including procedures, antibiotic start and stop dates in addition to other pertinent events   10/8: Admitted with acute on chronic HFpEF with severe pulmonary hypertension-- started on intermittent diuresis 10/9: TTE LVEF 60-65% with RV function mildly reduced, enlarged RV  10/12: milrinone and lasix  infusions started 10/22 RHC: Elevated RA pressure with normal W, consistent with mod-severe PAH. CI 2.91 on milrinone  10/27: HD catheter placed and CRRT started. 10/29 Remains of CRRT for 1 week trial  10/30 patient remains on CRRT, on dobutamine 2.5, also receiving the midodrine  15 mg 3 times daily.  10/31 HF signed off, feel she is euvolemic recommend stopping CRRT. 11/4: ended trial of CRRT, clotted overnight. Will not restart, GOC discussion   Interim History / Subjective:  CRRT clotted overnight. End of 1 week trial so will not restarted. GOC discussion. See ipal   Objective    Blood pressure (!) 96/56, pulse 92, temperature 98.3 F (36.8 C),  temperature source Oral, resp. rate (!) 28, height (P) 5' 2 (1.575 m), weight 102.8 kg, SpO2 95%. CVP:  [0 mmHg-61 mmHg] 7 mmHg      Intake/Output Summary (Last 24 hours) at 12/26/2023 0739 Last data filed at 12/26/2023 0700 Gross per 24 hour  Intake 730.14 ml  Output 954.8 ml  Net -224.66 ml   Filed Weights   12/24/23 0500 12/25/23 0600 12/26/23 0500  Weight: 102.8 kg 104.4 kg 102.8 kg    Examination:  General: older female, chronically ill appearing, no acute distress  HEENT: Vascath to right internal jugular, mm dry, anicteric sclera  CV: s1s2, rrr  Pulm: limited by habitus but diminished bilaterally, no respiratory distress  Abs: rounded, soft, ntnd  Extremities: no edema, warm Neuro: awake and alert, oriented to self, place, time; however, slow to respond. At times does not answer questions when asked.   Resolved problem list   Assessment and Plan  Acute kidney injury on CKD 4 Cardiorenal syndrome  Acute on chronic hypoxic respiratory failure 2/2 OSA/OHS, pHTN Acute on chronic HFpEF, RV failure  Cardiogenic shock 2/2 RV failure  Obesity  Type 2 diabetes  Physical deconditioning   Overnight 11/3-11/4 patient clotted off CRRT circuit. Plan was for 1 week trial of CRRT ending 11/4 AM, so not restarted. Patient has had no improvement in her condition. She remains anuric and inotrope dependent on dobutamine with soft pressures on/off levophed . Unfortunately, she is not a long term dialysis patient. AHF team signed off 10/31 as felt she was euvolemic with little to offer. Recommended comfort care. See IPAL note from today.   -  do not restart CRRT  - con't dobutamine 2.5mcg/kg/min - ne prn  - ssi, semglee , cbg q4h  - increase midodrine  to 20mg  q8h, MAP goal > 55   - pulmicort, brovana, prn duoneb  - statin  - appreciate palliative care involvement, needs ongoing GOC discussion primarily with family to work through decision making. Patient seems to be leaning palliative.    See IPAL  Labs   CBC: Recent Labs  Lab 12/22/23 1024 12/23/23 0518 12/24/23 0442 12/25/23 0432 12/26/23 0517  WBC 19.7* 18.4* 18.7* 18.9* 16.8*  HGB 10.1* 10.2* 10.9* 11.5* 11.3*  HCT 35.4* 35.5* 37.8 39.4 38.3  MCV 77.0* 76.5* 76.1* 76.2* 75.2*  PLT 156 155 134* 132* 102*    Basic Metabolic Panel: Recent Labs  Lab 12/22/23 0433 12/22/23 1611 12/23/23 0518 12/23/23 1651 12/24/23 0442 12/24/23 1600 12/25/23 0432 12/25/23 1653 12/26/23 0517  NA 133*   < > 137   < > 134* 136 134* 132* 134*  K 4.3   < > 4.2   < > 4.5 4.5 4.3 5.1 4.2  CL 94*   < > 98   < > 95* 96* 97* 96* 98  CO2 23   < > 23   < > 21* 22 21* 19* 20*  GLUCOSE 108*   < > 165*   < > 188* 228* 191* 244* 184*  BUN 8   < > 7*   < > 8 14 12 10 13   CREATININE 1.32*   < > 1.29*   < > 1.39* 1.30* 1.16* 1.09* 1.41*  CALCIUM  9.4   < > 9.3   < > 9.7 9.7 10.1 9.5 10.5*  MG 2.5*  --  2.4  --  2.4  --  2.6*  --  2.5*  PHOS 2.0*   < > 1.8*   < > 2.2* 3.3 2.2* 4.9* 2.9   < > = values in this interval not displayed.   GFR: Estimated Creatinine Clearance: 42.1 mL/min (A) (by C-G formula based on SCr of 1.41 mg/dL (H)). Recent Labs  Lab 12/23/23 0518 12/24/23 0442 12/25/23 0432 12/26/23 0517  WBC 18.4* 18.7* 18.9* 16.8*    Liver Function Tests: Recent Labs  Lab 12/24/23 0442 12/24/23 1600 12/25/23 0432 12/25/23 1653 12/26/23 0517  ALBUMIN  2.9* 3.0* 3.0* 2.9* 3.0*   No results for input(s): LIPASE, AMYLASE in the last 168 hours. No results for input(s): AMMONIA in the last 168 hours.   ABG    Component Value Date/Time   PHART 7.39 12/05/2023 0752   PCO2ART 58 (H) 12/05/2023 0752   PO2ART <31 (LL) 12/05/2023 0752   HCO3 38.2 (H) 12/13/2023 1523   TCO2 40 (H) 12/13/2023 1523   ACIDBASEDEF 1.6 03/27/2023 2240   O2SAT 54.8 12/26/2023 0517     Coagulation Profile: No results for input(s): INR, PROTIME in the last 168 hours.   Cardiac Enzymes: No results for input(s): CKTOTAL,  CKMB, CKMBINDEX, TROPONINI in the last 168 hours.  HbA1C: Hgb A1c MFr Bld  Date/Time Value Ref Range Status  11/29/2023 07:02 PM 8.8 (H) 4.8 - 5.6 % Final    Comment:    (NOTE) Diagnosis of Diabetes The following HbA1c ranges recommended by the American Diabetes Association (ADA) may be used as an aid in the diagnosis of diabetes mellitus.  Hemoglobin             Suggested A1C NGSP%              Diagnosis  <5.7  Non Diabetic  5.7-6.4                Pre-Diabetic  >6.4                   Diabetic  <7.0                   Glycemic control for                       adults with diabetes.    01/15/2023 03:57 AM 9.2 (H) 4.8 - 5.6 % Final    Comment:    (NOTE) Pre diabetes:          5.7%-6.4%  Diabetes:              >6.4%  Glycemic control for   <7.0% adults with diabetes     CBG: Recent Labs  Lab 12/25/23 0620 12/25/23 1112 12/25/23 1611 12/25/23 2132 12/26/23 0611  GLUCAP 181* 213* 232* 217* 207*     CC time: 42  The patient is critically ill with multiple organ system failure and requires high complexity decision making for assessment and support, frequent evaluation and titration of therapies, advanced monitoring, review of radiographic studies and interpretation of complex data.    Critical Care Time devoted to patient care services, exclusive of separately billable procedures, described in this note is 42  Tinnie FORBES Adolph DEVONNA [facility] 12/26/23 2:06 PM  Please see [email or web address] for pager details.  From 7A-7P if no response, please call [phone] After hours, please call ELink [phone]

## 2023-12-26 NOTE — Plan of Care (Signed)
  Problem: Metabolic: Goal: Ability to maintain appropriate glucose levels will improve Outcome: Progressing   Problem: Clinical Measurements: Goal: Ability to maintain clinical measurements within normal limits will improve Outcome: Progressing Goal: Respiratory complications will improve Outcome: Progressing   Problem: Pain Managment: Goal: General experience of comfort will improve and/or be controlled Outcome: Progressing   Problem: Safety: Goal: Ability to remain free from injury will improve Outcome: Progressing   Problem: Nutritional: Goal: Maintenance of adequate nutrition will improve Outcome: Not Progressing   Problem: Activity: Goal: Risk for activity intolerance will decrease Outcome: Not Progressing

## 2023-12-26 NOTE — Progress Notes (Signed)
 CCM notified of hypotension. Midodrine  increased per CCM, See MAR. Map goal >55 per CCM. Do not restart pressors unless map <55

## 2023-12-27 ENCOUNTER — Inpatient Hospital Stay (HOSPITAL_COMMUNITY)

## 2023-12-27 DIAGNOSIS — I132 Hypertensive heart and chronic kidney disease with heart failure and with stage 5 chronic kidney disease, or end stage renal disease: Principal | ICD-10-CM

## 2023-12-27 DIAGNOSIS — J9621 Acute and chronic respiratory failure with hypoxia: Secondary | ICD-10-CM

## 2023-12-27 DIAGNOSIS — J9622 Acute and chronic respiratory failure with hypercapnia: Secondary | ICD-10-CM

## 2023-12-27 LAB — RENAL FUNCTION PANEL
Albumin: 2.8 g/dL — ABNORMAL LOW (ref 3.5–5.0)
Albumin: 2.8 g/dL — ABNORMAL LOW (ref 3.5–5.0)
Anion gap: 19 — ABNORMAL HIGH (ref 5–15)
Anion gap: 16 — ABNORMAL HIGH (ref 5–15)
BUN: 35 mg/dL — ABNORMAL HIGH (ref 8–23)
BUN: 43 mg/dL — ABNORMAL HIGH (ref 8–23)
CO2: 20 mmol/L — ABNORMAL LOW (ref 22–32)
CO2: 21 mmol/L — ABNORMAL LOW (ref 22–32)
Calcium: 10.5 mg/dL — ABNORMAL HIGH (ref 8.9–10.3)
Calcium: 10.2 mg/dL (ref 8.9–10.3)
Chloride: 94 mmol/L — ABNORMAL LOW (ref 98–111)
Chloride: 93 mmol/L — ABNORMAL LOW (ref 98–111)
Creatinine, Ser: 3.39 mg/dL — ABNORMAL HIGH (ref 0.44–1.00)
Creatinine, Ser: 4.31 mg/dL — ABNORMAL HIGH (ref 0.44–1.00)
GFR, Estimated: 14 mL/min — ABNORMAL LOW (ref >60)
GFR, Estimated: 10 mL/min — ABNORMAL LOW (ref >60)
Glucose, Bld: 183 mg/dL — ABNORMAL HIGH (ref 70–99)
Glucose, Bld: 202 mg/dL — ABNORMAL HIGH (ref 70–99)
Phosphorus: 3.2 mg/dL (ref 2.5–4.6)
Phosphorus: 2.8 mg/dL (ref 2.5–4.6)
Potassium: 4.2 mmol/L (ref 3.5–5.1)
Potassium: 4.6 mmol/L (ref 3.5–5.1)
Sodium: 133 mmol/L — ABNORMAL LOW (ref 135–145)
Sodium: 130 mmol/L — ABNORMAL LOW (ref 135–145)

## 2023-12-27 LAB — GLUCOSE, CAPILLARY
Glucose-Capillary: 175 mg/dL — ABNORMAL HIGH (ref 70–99)
Glucose-Capillary: 169 mg/dL — ABNORMAL HIGH (ref 70–99)
Glucose-Capillary: 214 mg/dL — ABNORMAL HIGH (ref 70–99)
Glucose-Capillary: 177 mg/dL — ABNORMAL HIGH (ref 70–99)

## 2023-12-27 LAB — CBC
HCT: 35.9 % — ABNORMAL LOW (ref 36.0–46.0)
Hemoglobin: 10.5 g/dL — ABNORMAL LOW (ref 12.0–15.0)
MCH: 22.1 pg — ABNORMAL LOW (ref 26.0–34.0)
MCHC: 29.2 g/dL — ABNORMAL LOW (ref 30.0–36.0)
MCV: 75.6 fL — ABNORMAL LOW (ref 80.0–100.0)
Platelets: 97 K/uL — ABNORMAL LOW (ref 150–400)
RBC: 4.75 MIL/uL (ref 3.87–5.11)
RDW: 22.6 % — ABNORMAL HIGH (ref 11.5–15.5)
WBC: 15.6 K/uL — ABNORMAL HIGH (ref 4.0–10.5)
nRBC: 3.7 % — ABNORMAL HIGH (ref 0.0–0.2)

## 2023-12-27 LAB — COOXEMETRY PANEL
Carboxyhemoglobin: 2 % — ABNORMAL HIGH (ref 0.5–1.5)
Methemoglobin: <0.7 % (ref 0.0–1.5)
O2 Saturation: 63.7 %
Total hemoglobin: 9.3 g/dL — ABNORMAL LOW (ref 12.0–16.0)

## 2023-12-27 LAB — MAGNESIUM: Magnesium: 2.4 mg/dL (ref 1.7–2.4)

## 2023-12-27 LAB — APTT: aPTT: 43 s — ABNORMAL HIGH (ref 24–36)

## 2023-12-27 MED ORDER — ALTEPLASE 2 MG IJ SOLR
INTRAMUSCULAR | Status: AC
  Administered 2023-12-27: 2 mg
  Filled 2023-12-27: qty 2

## 2023-12-27 MED ORDER — ALTEPLASE 2 MG IJ SOLR: 2.0000 mg | INTRAMUSCULAR | Status: AC

## 2023-12-27 NOTE — Progress Notes (Signed)
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  (LASIX ) 200 mg in dextrose  5 % 100 mL (2 mg/mL) infusion    potassium chloride  SA (KLOR-CON  M) CR tablet 40 mEq    methylPREDNISolone  sodium succinate (SOLU-MEDROL ) 125 mg/2 mL injection 60 mg    predniSONE (DELTASONE) tablet 20 mg    hydrOXYzine  (ATARAX ) tablet 25 mg    hydrALAZINE  (APRESOLINE ) injection 10 mg     oxyCODONE  (Oxy IR/ROXICODONE ) immediate release tablet 5 mg    heparin  injection 5,000 Units    pregabalin  (LYRICA ) capsule 75 mg    midodrine  (PROAMATINE ) tablet 5 mg    insulin  aspart (novoLOG ) injection 3 Units    insulin  glargine-yfgn (SEMGLEE ) injection 35 Units    oxyCODONE  (Oxy IR/ROXICODONE ) immediate release tablet 10 mg    milrinone (PRIMACOR) 20 MG/100 ML (0.2 mg/mL) infusion    liver oil-zinc  oxide (DESITIN) 40 % ointment    phenylephrine  (NEO-SYNEPHRINE) 20mg /NS 250mL premix infusion    phenylephrine  CONCENTRATED 100mg  in sodium chloride  0.9% 250mL (0.4mg /mL) premix infusion    midodrine  (PROAMATINE ) tablet 10 mg    cefTRIAXone  (ROCEPHIN ) 2 g in sodium chloride  0.9 % 100 mL IVPB    linezolid  (ZYVOX ) IVPB 600 mg    midodrine  (PROAMATINE ) tablet 15 mg    phenylephrine  (NEO-SYNEPHRINE) 20mg /NS 250mL premix infusion    phenylephrine  CONCENTRATED 100mg  in sodium chloride  0.9% 250mL (0.4mg /mL) premix infusion    insulin  glargine-yfgn (SEMGLEE ) injection 25 Units    pantoprazole  (PROTONIX ) EC tablet 40 mg    calcitRIOL (ROCALTROL) capsule 0.25 mcg    potassium PHOSPHATE  15 mmol in dextrose  5 % 250 mL infusion    norepinephrine  (LEVOPHED ) 4mg  in (0.016 mg/mL) premix infusion    famotidine  (PEPCID ) tablet 20 mg    insulin  glargine-yfgn (SEMGLEE ) injection 10 Units    norepinephrine  (LEVOPHED ) 4mg  in (0.016 mg/mL) premix infusion    prismasol BGK 4/2.5 infusion    prismasol BGK 4/2.5 infusion    prismasol BGK 4/2.5 infusion    heparin  10,000 units/ 20 mL infusion syringe    midodrine  (PROAMATINE ) tablet 15 mg    heparin  injection 1,000-6,000 Units    insulin  glargine-yfgn (SEMGLEE ) injection 15 Units     Social History:  reports that she has quit smoking. She has never used smokeless tobacco. She reports current alcohol  use. She reports that she does not use drugs.  Family History:   Family History  Problem Relation Age of Onset   Diabetes Mother     Blood  pressure (!) 96/52, pulse 99, temperature 98.6 F (37 C), temperature source Axillary, resp. rate (!) 22, height (P) 5' 2 (1.575 m), weight 98.1 kg, SpO2 94%. Physical Exam: General appearance: alert, cooperative, and morbidly obese Head: NCAT Eyes: negative Neck: no adenopathy, no carotid bruit Resp: distant BS Cardio: regular rate and rhythm GI: SNDNT +BS Extremities: edema tr Pulses: 2+ and symmetric     Decie Verne, LYNWOOD ORN, MD 12/27/2023, 9:52 AM

## 2023-12-27 NOTE — Progress Notes (Signed)
 Pt refusing all morning medications and refusing mobility despite reeducation. CCM team made aware.

## 2023-12-27 NOTE — Progress Notes (Signed)
 NAME:  [name] [name], MRN:  [id number], DOB:  1955-04-05, LOS: 28 ADMISSION DATE:  11/29/2023, CONSULTATION DATE:  12/18/23 REFERRING MD:  Fairy Frames, CHIEF COMPLAINT:  diastolic heart failure   History of Present Illness:  68 year old female with known OSA/OHS, HFpEF, pulmonary hypertension, RV failure CKD 4, bedbound x 3 years admitted 10/8 with acute on chronic HFpEF with 2 week history of weight gain, lower extremity edema and SOB.  Her hospital course is complicated by low output state requiring milrinone infusion and lasix  infusion.  Due to ongoing challenges with continued congestion and worsening AKI despite high dose diuretics,  patient was transferred to ICU on 10/27 for Vascath placement and need for CVVHD  RHC 10/22: elevated RA Pressure with normal W.    Pertinent  Medical History  OSA/OHS, poor compliance with CPAP HFpEF Pulmonary hypertension RV failure CKD stage IV Bedbound x 3 years  Significant Hospital Events: Including procedures, antibiotic start and stop dates in addition to other pertinent events   10/8: Admitted with acute on chronic HFpEF with severe pulmonary hypertension-- started on intermittent diuresis 10/9: TTE LVEF 60-65% with RV function mildly reduced, enlarged RV  10/12: milrinone and lasix  infusions started 10/22 RHC: Elevated RA pressure with normal W, consistent with mod-severe PAH. CI 2.91 on milrinone  10/27: HD catheter placed and CRRT started. 10/29 Remains of CRRT for 1 week trial  10/30 patient remains on CRRT, on dobutamine 2.5, also receiving the midodrine  15 mg 3 times daily.  10/31 HF signed off, feel she is euvolemic recommend stopping CRRT. 11/4: ended trial of CRRT, clotted overnight. Will not restart, GOC discussion   Interim History / Subjective:  Refusing all meds this morning  Ongoing GOC  Objective    Blood pressure (!) 84/48, pulse 98, temperature 98.6 F (37 C), temperature source Axillary, resp. rate 19, height  (P) 5' 2 (1.575 m), weight 98.1 kg, SpO2 95%. CVP:  [3 mmHg-81 mmHg] 16 mmHg      Intake/Output Summary (Last 24 hours) at 12/27/2023 9076 Last data filed at 12/27/2023 9077 Gross per 24 hour  Intake 138.96 ml  Output --  Net 138.96 ml   Filed Weights   12/25/23 0600 12/26/23 0500 12/27/23 0500  Weight: 104.4 kg 102.8 kg 98.1 kg    Examination:  General: older female, chronically ill appearing, no acute distress  HEENT: Vascath to right internal jugular, mm dry, anicteric sclera  CV: s1s2, rrr  Pulm: limited by habitus but diminished bilaterally, no respiratory distress  Abs: rounded, soft, ntnd  Extremities: no edema, warm Neuro: awake and alert, oriented to self, place, time; however, slow to respond. At times does not answer questions when asked.   Resolved problem list   Assessment and Plan  Acute kidney injury on CKD 4 Cardiorenal syndrome  Acute on chronic hypoxic respiratory failure 2/2 OSA/OHS, pHTN Acute on chronic HFpEF, RV failure  Cardiogenic shock 2/2 RV failure  Obesity  Type 2 diabetes  Physical deconditioning   Overnight 11/3-11/4 patient clotted off CRRT circuit. Plan was for 1 week trial of CRRT ending 11/4 AM, so not restarted. Patient has had no improvement in her condition. She remains anuric and inotrope dependent on dobutamine with soft pressures on/off levophed . Unfortunately, she is not a long term dialysis patient. AHF team signed off 10/31 as felt she was euvolemic with little to offer. Recommended comfort care.   11/5: refusing medications this morning.   - ongoing GOC discussion today  -  do not restart CRRT  - con't dobutamine 2.5mcg/kg/min - ne prn  - ssi, semglee , cbg q4h  - increase midodrine  to 20mg  q8h, MAP goal > 55   - pulmicort, brovana, prn duoneb  - statin  - appreciate palliative care involvement, needs ongoing GOC discussion primarily with family to work through decision making. Patient seems to be leaning palliative.    Labs    CBC: Recent Labs  Lab 12/23/23 0518 12/24/23 0442 12/25/23 0432 12/26/23 0517 12/27/23 0441  WBC 18.4* 18.7* 18.9* 16.8* 15.6*  HGB 10.2* 10.9* 11.5* 11.3* 10.5*  HCT 35.5* 37.8 39.4 38.3 35.9*  MCV 76.5* 76.1* 76.2* 75.2* 75.6*  PLT 155 134* 132* 102* 97*    Basic Metabolic Panel: Recent Labs  Lab 12/23/23 0518 12/23/23 1651 12/24/23 0442 12/24/23 1600 12/25/23 0432 12/25/23 1653 12/26/23 0517 12/26/23 1631 12/27/23 0441  NA 137   < > 134*   < > 134* 132* 134* 130* 133*  K 4.2   < > 4.5   < > 4.3 5.1 4.2 4.5 4.2  CL 98   < > 95*   < > 97* 96* 98 95* 94*  CO2 23   < > 21*   < > 21* 19* 20* 21* 20*  GLUCOSE 165*   < > 188*   < > 191* 244* 184* 250* 183*  BUN 7*   < > 8   < > 12 10 13 23  35*  CREATININE 1.29*   < > 1.39*   < > 1.16* 1.09* 1.41* 2.41* 3.39*  CALCIUM  9.3   < > 9.7   < > 10.1 9.5 10.5* 10.9* 10.5*  MG 2.4  --  2.4  --  2.6*  --  2.5*  --  2.4  PHOS 1.8*   < > 2.2*   < > 2.2* 4.9* 2.9 2.7 3.2   < > = values in this interval not displayed.   GFR: Estimated Creatinine Clearance: 17.1 mL/min (A) (by C-G formula based on SCr of 3.39 mg/dL (H)). Recent Labs  Lab 12/24/23 0442 12/25/23 0432 12/26/23 0517 12/27/23 0441  WBC 18.7* 18.9* 16.8* 15.6*    Liver Function Tests: Recent Labs  Lab 12/25/23 0432 12/25/23 1653 12/26/23 0517 12/26/23 1631 12/27/23 0441  ALBUMIN  3.0* 2.9* 3.0* 2.9* 2.8*   No results for input(s): LIPASE, AMYLASE in the last 168 hours. No results for input(s): AMMONIA in the last 168 hours.   ABG    Component Value Date/Time   PHART 7.39 12/05/2023 0752   PCO2ART 58 (H) 12/05/2023 0752   PO2ART <31 (LL) 12/05/2023 0752   HCO3 38.2 (H) 12/13/2023 1523   TCO2 40 (H) 12/13/2023 1523   ACIDBASEDEF 1.6 03/27/2023 2240   O2SAT 63.7 12/27/2023 0450     Coagulation Profile: No results for input(s): INR, PROTIME in the last 168 hours.   Cardiac Enzymes: No results for input(s): CKTOTAL, CKMB, CKMBINDEX,  TROPONINI in the last 168 hours.  HbA1C: Hgb A1c MFr Bld  Date/Time Value Ref Range Status  11/29/2023 07:02 PM 8.8 (H) 4.8 - 5.6 % Final    Comment:    (NOTE) Diagnosis of Diabetes The following HbA1c ranges recommended by the American Diabetes Association (ADA) may be used as an aid in the diagnosis of diabetes mellitus.  Hemoglobin             Suggested A1C NGSP%              Diagnosis  <5.7  Non Diabetic  5.7-6.4                Pre-Diabetic  >6.4                   Diabetic  <7.0                   Glycemic control for                       adults with diabetes.    01/15/2023 03:57 AM 9.2 (H) 4.8 - 5.6 % Final    Comment:    (NOTE) Pre diabetes:          5.7%-6.4%  Diabetes:              >6.4%  Glycemic control for   <7.0% adults with diabetes     CBG: Recent Labs  Lab 12/26/23 0611 12/26/23 1119 12/26/23 1624 12/26/23 2115 12/27/23 0608  GLUCAP 207* 221* 222* 172* 175*     CC time: 32  The patient is critically ill with multiple organ system failure and requires high complexity decision making for assessment and support, frequent evaluation and titration of therapies, advanced monitoring, review of radiographic studies and interpretation of complex data.    Critical Care Time devoted to patient care services, exclusive of separately billable procedures, described in this note is 32  Tinnie FORBES Adolph DEVONNA [facility] 12/27/23 9:23 AM  Please see [email or web address] for pager details.  From 7A-7P if no response, please call [phone] After hours, please call ELink [phone]

## 2023-12-27 NOTE — Progress Notes (Signed)
 Interval IPAL Discussion   Received update from palliative care team that plan was to send patient home hospice/hospice facility tomorrow but that patient wanted to be full code. I went to bedside to discuss this as these two ideas do not align. Son, [name] [name] was at bedside. I had a long discussion with him and then addressed [name] [name]. I explained to her the goal of hospice would be to be made comfortable at home with the expectation of passing at some point and that full code would mean she would be resuscitated. I asked her to repeat what she heard back to me and she could not and asked me to say it again. I again explained the situation and again she was unable to articulate any part of the explanation to me. I do not think she has capacity for decision making at this time. I redirected conversation back to [place] and he had aunt, [name] [name], on the phone to discuss this as well. Wife was also in the room. He understands the discrepancy in goals. He agrees to make her DNR now with expectation to move to full comfort/hospice on discharge tomorrow.   Will make her DNR-L now in the chart.   PMT updated.   Tinnie FORBES Furth, PA-C [facility] 12/27/23 2:30 PM  Please see [email or web address] for pager details.  From 7A-7P if no response, please call [phone] After hours, please call ELink [phone]

## 2023-12-27 NOTE — Progress Notes (Signed)
 Daily Progress Note   Patient Name: [name] [name]       Date: 12/27/2023 DOB: 11/09/1955  Age: 68 y.o. MRN#: [id number] Attending Physician: Gretta Leita SQUIBB, DO Primary Care Physician: [facility] Admit Date: 11/29/2023  Reason for Consultation/Follow-up: Establishing goals of care  Subjective: MDM: Medical records reviewed including progress notes, labs and imaging. Patient assessed at the bedside. She denies symptoms or any other concerns, though she appears uncomfortable.  Very hypertensive.  No visitors present.  Discussed with RN.  Patient refusing care today.  Called patient's son Francis to plan for additional goals of care discussions.  He was on his way out of dialysis and stated he would come straight to the hospital afterwards.  I then arrived to the bedside after son and daughter-in-law made it. Created space and opportunity for patient and family's thoughts and feelings on patient's current illness.  He is shared that after discussions with Dr. Stuart as well as his nephrologist and his dialysis center today, he understands why peritoneal dialysis or any dialysis is not an option for patient.  He has some hope after learning that patient has made scant amounts of urine.  He wonders how this will continue to progress over time.  Patient's priority is ultimately returning home and he understands that they will need hospice support to make this happen and ensure she is comfortable.  He is working with family to coordinate caregiver schedules as well as [facility].  They would like the hospice service that is tied to [facility].  We reviewed that she would need to be a DNR if she wanted to go to [facility].  Attempted to discuss CODE STATUS with patient,  though she agreed to both DNR and full code.  Family did not desire to make a decision and chose for her to remain full code, as this is what patient stated most frequently today despite confusion.  Discussed what focus of patient's care while she remains admitted.  Patient's son is leaning towards wanting vital signs and medical interventions as appropriate, commenting that he will likely need to buy some equipment for blood pressure checks etc. for caregivers to use at home.  He is not sure if they will be more successful than the nurses are at the hospital, as she has historically refused to participate in the past as well.  Patient also voiced that she needs to know what her labs and vital signs indicate.  She did not respond when I expressed that this is not congruent with  her actions today.  She understands that she can still refuse certain aspects of her care even though she is on comfort care.  He shares that family can be ready seems to prior to discharge her home with hospice.  They just need to get her house clean.  Noted later in the day family was agreeable to DNR after discussion with PCCM PA.  Questions and concerns addressed. PMT will continue to support holistically.   Length of Stay: 28   Physical Exam Vitals and nursing note reviewed.  Constitutional:      General: She is not in acute distress.    Appearance: She is ill-appearing.  HENT:     Head: Normocephalic and atraumatic.  Cardiovascular:     Rate and Rhythm: Normal rate.  Pulmonary:     Effort: Pulmonary effort is normal.  Neurological:     Mental Status: She is alert.  Psychiatric:        Mood and Affect: Mood normal.        Behavior: Behavior normal.            Vital Signs: BP (!) 96/52   Pulse 99   Temp 98.6 F (37 C) (Axillary)   Resp (!) 22   Ht (P) 5' 2 (1.575 m)   Wt 98.1 kg   SpO2 94%   BMI (P) 39.56 kg/m  SpO2: SpO2: 94 % O2 Device: O2 Device: Room Air O2 Flow Rate: O2 Flow Rate (L/min): 4  L/min      Palliative Assessment/Data: 20% at best   Palliative Care Assessment & Plan   Patient Profile: 68 y.o. female  with past medical history of  insulin -dependent type 2 diabetes, chronic heart failure with preserved EF, morbid obesity, bedbound, pulmonary hypertension admitted on 11/29/2023 with edema and weight gain.    Patient was admitted for acute exacerbation of chronic heart failure with preserved EF.  Her hospital course is complicated by low output state requiring milrinone infusion and lasix  infusion, as well as worsening AKI with Cr of 5.26 increased today from 4.3. Requiring transfer to ICU for CRRT. PMT has been consulted to assist with goals of care conversation.  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  Pending medical director approval of dobutamine for symptom management.  TOC, hospice liaison, and primary team notified Psychocial and emotional support provided  PMT will continue to follow and support as needed  Prognosis: Terminal   Discharge Planning: Home with Hospice   Care plan was discussed with patient, son and daughter-in-law, RN, PCCM PA, primary attending, TOC, hospice liaison         Donevin Sainsbury [name], PA-C  Palliative Medicine Team Team phone # [phone]  Thank you for allowing the Palliative Medicine Team to assist in the care of this patient. Please utilize secure chat with additional questions, if there is no response within 30 minutes please call the above phone number.  Palliative Medicine Team providers are available by phone from 7am to 7pm daily and can be reached through the team  cell phone.  Should this patient require assistance outside of these hours, please call the patient's attending physician.    Time Total: 50  Visit consisted of counseling and  education dealing with the complex and emotionally intense issues of symptom management and palliative care in the setting of serious and potentially life-threatening illness. Greater than 50% of this time was spent counseling and coordinating care related to the above assessment and plan.  Personally spent 50 minutes in patient care including extensive chart review (labs, imaging, progress/consult notes, vital signs), medically appropraite exam, discussed with treatment team, education to patient, family, and staff, documenting clinical information, medication review and management, coordination of care, and available advanced directive documents.

## 2023-12-27 NOTE — TOC Progression Note (Signed)
 Transition of Care [facility]) - Progression Note    Patient Details  Name: [name] [name] MRN: [id number] Date of Birth: [birth year]  [facility] CM/SW Contact  Graves-Bigelow, Erminio Deems, RN Phone Number: 12/27/2023, 3:31 PM  Clinical Narrative: ICM now following patient for discharge planning. ICM received notification from Palliative NP that the plan will be for home hospice. Palliative NP states family is agreeable to Hospice with [facility]. ICM spoke with patient's son and he states she lives alone; however, has family support and support of aides via the [facility]). Patient currently has oxygen  via the [place] and [facility] is aware that they will need to switch the oxygen  DME. [facility] Liaison to see if the Medical Director can approve the Dobutamine for home-awaiting response. Patient will need ambulance transport to home. ICM will continue to monitor for disposition needs.   Expected Discharge Plan: Home w Hospice Care Barriers to Discharge: No Barriers Identified  Expected Discharge Plan and Services In-house Referral: NA Discharge Planning Services: CM Consult Post Acute Care Choice: Hospice Living arrangements for the past 2 months: Apartment                 DME Arranged: N/A DME Agency: NA   HH Arranged: RN HH Agency:  [facility] [employer]) Date HH Agency Contacted: 12/27/23 Time HH Agency Contacted: 1531 Representative spoke with at [facility] Agency: Amy  Social Drivers of Health (SDOH) Interventions SDOH Screenings   Food Insecurity: No Food Insecurity (11/29/2023)  Housing: Low Risk  (11/29/2023)  Transportation Needs: No Transportation Needs (11/29/2023)  Utilities: Not At Risk (11/29/2023)  Alcohol  Screen: Low Risk  (03/29/2023)  Depression (PHQ2-9): Low Risk  (03/29/2023)  Financial Resource Strain: Patient Declined (05/16/2022)   Received from [facility]  Physical Activity: Inactive (03/29/2023)  Social Connections: Unknown (12/01/2023)   Stress: No Stress Concern Present (05/16/2022)   Received from [facility]  Tobacco Use: Medium Risk (12/01/2023)    Readmission Risk Interventions    12/02/2023   11:20 AM 03/28/2023   11:29 AM 02/23/2023   11:48 AM  Readmission Risk Prevention Plan  Transportation Screening Complete Complete Complete  PCP or Specialist Appt within 3-5 Days   Complete  HRI or Home Care Consult   Complete  Social Work Consult for Recovery Care Planning/Counseling   Complete  Palliative Care Screening   Not Applicable  Medication Review Oceanographer) Complete Complete Complete  PCP or Specialist appointment within 3-5 days of discharge Complete    HRI or Home Care Consult Complete Complete   SW Recovery Care/Counseling Consult Complete Complete   Palliative Care Screening Not Applicable Not Applicable   Skilled Nursing Facility Not Applicable Not Applicable

## 2023-12-28 ENCOUNTER — Other Ambulatory Visit (HOSPITAL_COMMUNITY): Payer: Self-pay

## 2023-12-28 DIAGNOSIS — Z515 Encounter for palliative care: Secondary | ICD-10-CM

## 2023-12-28 LAB — RENAL FUNCTION PANEL
Albumin: 2.8 g/dL — ABNORMAL LOW (ref 3.5–5.0)
Anion gap: 18 — ABNORMAL HIGH (ref 5–15)
BUN: 52 mg/dL — ABNORMAL HIGH (ref 8–23)
CO2: 19 mmol/L — ABNORMAL LOW (ref 22–32)
Calcium: 10.1 mg/dL (ref 8.9–10.3)
Chloride: 93 mmol/L — ABNORMAL LOW (ref 98–111)
Creatinine, Ser: 5.42 mg/dL — ABNORMAL HIGH (ref 0.44–1.00)
GFR, Estimated: 8 mL/min — ABNORMAL LOW (ref >60)
Glucose, Bld: 189 mg/dL — ABNORMAL HIGH (ref 70–99)
Phosphorus: 2.8 mg/dL (ref 2.5–4.6)
Potassium: 4.8 mmol/L (ref 3.5–5.1)
Sodium: 130 mmol/L — ABNORMAL LOW (ref 135–145)

## 2023-12-28 LAB — APTT: aPTT: 46 s — ABNORMAL HIGH (ref 24–36)

## 2023-12-28 LAB — GLUCOSE, CAPILLARY
Glucose-Capillary: 209 mg/dL — ABNORMAL HIGH (ref 70–99)
Glucose-Capillary: 185 mg/dL — ABNORMAL HIGH (ref 70–99)

## 2023-12-28 LAB — MAGNESIUM: Magnesium: 2.6 mg/dL — ABNORMAL HIGH (ref 1.7–2.4)

## 2023-12-28 MED ORDER — NAPHAZOLINE-PHENIRAMINE 0.025-0.3 % OP SOLN
1.0000 [drp] | Freq: Four times a day (QID) | OPHTHALMIC | 0 refills | Status: AC | PRN
  Filled 2023-12-28: qty 15, 75d supply, fill #0

## 2023-12-28 MED ORDER — CAPSAICIN 0.075 % EX CREA
TOPICAL_CREAM | Freq: Two times a day (BID) | CUTANEOUS | 0 refills | Status: AC
  Filled 2023-12-28: qty 57, 20d supply, fill #0

## 2023-12-28 MED ORDER — TRAZODONE HCL 50 MG PO TABS
25.0000 mg | ORAL_TABLET | Freq: Every evening | ORAL | 0 refills | Status: AC | PRN
  Filled 2023-12-28 (×2): qty 15, 30d supply, fill #0

## 2023-12-28 MED ORDER — ACETAMINOPHEN 650 MG RE SUPP
650.0000 mg | Freq: Four times a day (QID) | RECTAL | 0 refills | Status: AC | PRN
  Filled 2023-12-28 (×2): qty 12, 3d supply, fill #0

## 2023-12-28 MED ORDER — HYDROMORPHONE HCL 1 MG/ML PO LIQD: 1.0000 mg | ORAL | Status: DC | PRN

## 2023-12-28 MED ORDER — CAMPHOR-MENTHOL 0.5-0.5 % EX LOTN
TOPICAL_LOTION | CUTANEOUS | 0 refills | Status: AC | PRN
  Filled 2023-12-28: qty 222, 30d supply, fill #0

## 2023-12-28 MED ORDER — HYDROMORPHONE HCL 1 MG/ML PO LIQD: 1.0000 mg | ORAL | 0 refills | Status: AC | PRN

## 2023-12-28 MED ORDER — ACETAMINOPHEN 325 MG PO TABS
650.0000 mg | ORAL_TABLET | Freq: Four times a day (QID) | ORAL | 0 refills | Status: AC | PRN
  Filled 2023-12-28 (×2): qty 30, 4d supply, fill #0

## 2023-12-28 MED ORDER — ONDANSETRON 4 MG PO TBDP
4.0000 mg | ORAL_TABLET | Freq: Three times a day (TID) | ORAL | 0 refills | Status: AC | PRN
  Filled 2023-12-28 (×2): qty 20, 7d supply, fill #0

## 2023-12-28 MED ORDER — DIPHENHYDRAMINE HCL 25 MG PO CAPS
25.0000 mg | ORAL_CAPSULE | Freq: Four times a day (QID) | ORAL | 0 refills | Status: AC | PRN
  Filled 2023-12-28: qty 30, 8d supply, fill #0

## 2023-12-28 MED ORDER — DOBUTAMINE-DEXTROSE 4-5 MG/ML-% IV SOLN
INTRAVENOUS | 2 refills | Status: AC
  Filled 2023-12-28: qty 500, fill #0

## 2023-12-28 MED ORDER — DIPHENHYDRAMINE HCL 25 MG PO CAPS
25.0000 mg | ORAL_CAPSULE | Freq: Four times a day (QID) | ORAL | Status: DC | PRN
Start: 2023-12-28 — End: 2023-12-28

## 2023-12-28 NOTE — Plan of Care (Signed)
  Problem: Fluid Volume: Goal: Ability to maintain a balanced intake and output will improve Outcome: Not Progressing   Problem: Nutritional: Goal: Maintenance of adequate nutrition will improve Outcome: Not Progressing   Problem: Education: Goal: Knowledge of General Education information will improve Description: Including pain rating scale, medication(s)/side effects and non-pharmacologic comfort measures Outcome: Progressing   Problem: Pain Managment: Goal: General experience of comfort will improve and/or be controlled Outcome: Progressing

## 2023-12-28 NOTE — Progress Notes (Signed)
 Patient ID: [name] [name], female   DOB: October 27, 1955, 68 y.o.   MRN: [id number] S: Appreciate PCCM discussion with patient and family regarding goals of care.  Pt is now DNR with plans for discharge to home with hospice. O:BP 101/60   Pulse 93   Temp 98.3 F (36.8 C) (Axillary)   Resp (!) 23   Ht (P) 5' 2 (1.575 m)   Wt 103.6 kg   SpO2 90%   BMI (P) 41.77 kg/m   Intake/Output Summary (Last 24 hours) at 12/28/2023 0919 Last data filed at 12/28/2023 0600 Gross per 24 hour  Intake 115.56 ml  Output --  Net 115.56 ml   Intake/Output: I/O last 3 completed shifts: In: 193.6 [I.V.:193.6] Out: -   Intake/Output this shift:  No intake/output data recorded. Weight change: 5.5 kg Gen: Morbidly obese, in NAD CVS: RRR Resp: CTA Abd: obese, +BS, NT Ext: no edema  Recent Labs  Lab 12/25/23 0432 12/25/23 1653 12/26/23 0517 12/26/23 1631 12/27/23 0441 12/27/23 1728 12/28/23 0651  NA 134* 132* 134* 130* 133* 130* 130*  K 4.3 5.1 4.2 4.5 4.2 4.6 4.8  CL 97* 96* 98 95* 94* 93* 93*  CO2 21* 19* 20* 21* 20* 21* 19*  GLUCOSE 191* 244* 184* 250* 183* 202* 189*  BUN 12 10 13 23  35* 43* 52*  CREATININE 1.16* 1.09* 1.41* 2.41* 3.39* 4.31* 5.42*  ALBUMIN  3.0* 2.9* 3.0* 2.9* 2.8* 2.8* 2.8*  CALCIUM  10.1 9.5 10.5* 10.9* 10.5* 10.2 10.1  PHOS 2.2* 4.9* 2.9 2.7 3.2 2.8 2.8   Liver Function Tests: Recent Labs  Lab 12/27/23 0441 12/27/23 1728 12/28/23 0651  ALBUMIN  2.8* 2.8* 2.8*   No results for input(s): LIPASE, AMYLASE in the last 168 hours. No results for input(s): AMMONIA in the last 168 hours. CBC: Recent Labs  Lab 12/23/23 0518 12/24/23 0442 12/25/23 0432 12/26/23 0517 12/27/23 0441  WBC 18.4* 18.7* 18.9* 16.8* 15.6*  HGB 10.2* 10.9* 11.5* 11.3* 10.5*  HCT 35.5* 37.8 39.4 38.3 35.9*  MCV 76.5* 76.1* 76.2* 75.2* 75.6*  PLT 155 134* 132* 102* 97*   Cardiac Enzymes: No results for input(s): CKTOTAL, CKMB, CKMBINDEX, TROPONINI in the last 168  hours. CBG: Recent Labs  Lab 12/27/23 0608 12/27/23 1159 12/27/23 1653 12/27/23 2114 12/28/23 0638  GLUCAP 175* 169* 214* 177* 209*    Iron Studies: No results for input(s): IRON, TIBC, TRANSFERRIN, FERRITIN in the last 72 hours. Studies/Results: DG Chest Port 1 View Result Date: 12/27/2023 CLINICAL DATA:  Assess PICC line. EXAM: PORTABLE CHEST 1 VIEW COMPARISON:  12/18/2023 FINDINGS: Right IJ central venous catheter unchanged. Right-sided PICC line unchanged with tip over the SVC. No evidence of kinking along the course of the right-sided PICC line. Lungs are adequately inflated without lobar consolidation or effusion. Minimal prominence of the central pulmonary vessels with slight interval improvement which may represent minimal residual vascular congestion. Mild stable cardiomegaly. Remainder of the exam is unchanged. IMPRESSION: 1. Suggestion of minimal residual vascular congestion. 2. Stable cardiomegaly. 3. Tubes and lines as described. Electronically Signed   By: Toribio Agreste M.D.   On: 12/27/2023 16:50    (feeding supplement) PROSource Plus  30 mL Oral BID BM   acidophilus  2 capsule Oral TID   allopurinol   100 mg Oral QHS   arformoterol  15 mcg Nebulization BID   atorvastatin   10 mg Oral QHS   budesonide (PULMICORT) nebulizer solution  0.5 mg Nebulization BID   capsicum   Topical BID  Chlorhexidine  Gluconate Cloth  6 each Topical Daily   diclofenac Sodium  2 g Topical QID   feeding supplement  237 mL Oral Daily   fluticasone  2 spray Each Nare Daily   Gerhardt's butt cream   Topical BID   heparin  injection (subcutaneous)  5,000 Units Subcutaneous Q8H   insulin  aspart  0-20 Units Subcutaneous TID WC   insulin  aspart  0-5 Units Subcutaneous QHS   insulin  glargine-yfgn  18 Units Subcutaneous Daily   ipratropium-albuterol   3 mL Nebulization BID   loratadine   10 mg Oral Daily   midodrine   20 mg Oral Q8H   multivitamin  1 tablet Oral QHS   nystatin  cream  1  Application Topical BID   polycarbophil  625 mg Oral Daily   polyethylene glycol  17 g Oral Daily   pregabalin   50 mg Oral Daily   sertraline  50 mg Oral q AM   sodium chloride  flush  10-40 mL Intracatheter Q12H   triamcinolone  1 Application Topical Daily    BMET    Component Value Date/Time   NA 130 (L) 12/28/2023 0651   K 4.8 12/28/2023 0651   CL 93 (L) 12/28/2023 0651   CO2 19 (L) 12/28/2023 0651   GLUCOSE 189 (H) 12/28/2023 0651   BUN 52 (H) 12/28/2023 0651   CREATININE 5.42 (H) 12/28/2023 0651   CALCIUM  10.1 12/28/2023 0651   GFRNONAA 8 (L) 12/28/2023 0651   GFRAA 44 (L) 11/23/2019 0437   CBC    Component Value Date/Time   WBC 15.6 (H) 12/27/2023 0441   RBC 4.75 12/27/2023 0441   HGB 10.5 (L) 12/27/2023 0441   HCT 35.9 (L) 12/27/2023 0441   PLT 97 (L) 12/27/2023 0441   MCV 75.6 (L) 12/27/2023 0441   MCH 22.1 (L) 12/27/2023 0441   MCHC 29.2 (L) 12/27/2023 0441   RDW 22.6 (H) 12/27/2023 0441   LYMPHSABS 0.5 (L) 12/13/2023 1031   MONOABS 0.8 12/13/2023 1031   EOSABS 0.0 12/13/2023 1031   BASOSABS 0.0 12/13/2023 1031    Gila [name] [name] is an 68 y.o. female with right-sided heart failure, morbid obesity, chronic O2 requirement, bedbound, hypertension, sleep apnea initially treated with initially IV Lasix  drip followed by IV pushes of Lasix  with fluctuating renal function.  Creatinine then eventually started rising consistently to 5.26 at time of consultation with a CVP of 16.  Her blood pressure systolic was noted to be in the 90- 100s.  Was also treated for infection with fluid linezolid  and ceftriaxone .  Heart cath showed a RV pressure of 70/20, PCWP 12.  Breathing was not improved from time of admission despite being net -15 L at time of consultation.  Her son is currently on dialysis chronically.    Assessment/Plan: Renal failure -likely secondary to cardiorenal syndrome but certainly may have a contribution from ATN with possible sepsis currently being worked up  and on linezolid  and ceftriaxone . Failed trial with Lasix  160 mg IV x 1 at time of consultation;    -Started CRRT 830PM on 10/28-11/3 on dobutamine 2.5 and off neo    Net neg 29.5 L during hospitalization; I would not recommend restarting CRRT.    She completed the agreed upon limited trial of CRRT without improvement of UOP or renal function despite significant volume removal.   She did not want long-term dialysis and both Dr. Melia and I agree that she is not a suitable candidate for long term dialysis her multiple co-morbidities, poor functional and nutritional  status, and bed bound status; PCCM discussed goals of care with son and family yesterday and pt is now DNR with plans for discharge with hospice and full comfort care.  We agree with plan and have nothing further to add.  Will sign off at this time.   Right sided heart failure -followed by cardiology, net -15 L during this hospitalization but no improvement and dyspnea.  Failed high-dose Lasix  challenge with CVP; dobutamine 2.5/KG and off milrinone. Worsening leukocytosis - WBC up to 28K and down to 18.9 K now Anemia - TSAT 9% F173, no IV iron with elevated white count. Transfuse as needed OSA on BiPAP; O2 required at home Hypertension - but had been on lower side; on crrt currently and Neo off DM on insulin  therapy before meals + Semglee    [name] RONAL Sellar, MD [facility]

## 2023-12-28 NOTE — TOC Transition Note (Signed)
 Transition of Care [facility]) - Discharge Note   Patient Details  Name: [name] [name] MRN: [id number] Date of Birth: [birth year]  Transition of Care [facility]) CM/SW Contact:  Sudie Erminio Deems, RN Phone Number: 12/28/2023, 10:58 AM   Clinical Narrative:  ICM was notified that the patient can discharge home today. Patient will be followed by [facility]-Liaison states the Dobutamine can be connected around 1:00 pm. ICM did speak with son Francis and he is in the home awaiting DME to be delivered. Patient still has oxygen  in the home via the [place]. Patient has a hospital bed. PTAR will need to be called once the Dobutamine has been connected. No further needs identified at this time.     Final next level of care: Home w Hospice Care Barriers to Discharge: No Barriers Identified   Patient Goals and CMS Choice Patient states their goals for this hospitalization and ongoing recovery are:: wants to return home CMS Medicare.gov Compare Post Acute Care list provided to:: Patient Choice offered to / list presented to : Patient   Discharge Plan and Services Additional resources added to the After Visit Summary for   In-house Referral: NA Discharge Planning Services: CM Consult Post Acute Care Choice: Hospice          DME Arranged: N/A DME Agency: NA       HH Arranged: RN HH Agency:  [facility]) Date HH Agency Contacted: 12/27/23 Time HH Agency Contacted: 1531 Representative spoke with at [facility] Agency: Amy  Social Drivers of Health (SDOH) Interventions SDOH Screenings   Food Insecurity: No Food Insecurity (11/29/2023)  Housing: Low Risk  (11/29/2023)  Transportation Needs: No Transportation Needs (11/29/2023)  Utilities: Not At Risk (11/29/2023)  Alcohol  Screen: Low Risk  (03/29/2023)  Depression (PHQ2-9): Low Risk  (03/29/2023)  Financial Resource Strain: Patient Declined (05/16/2022)   Received from [facility]  Physical Activity: Inactive (03/29/2023)  Social  Connections: Unknown (12/01/2023)  Stress: No Stress Concern Present (05/16/2022)   Received from [facility]  Tobacco Use: Medium Risk (12/01/2023)     Readmission Risk Interventions    12/02/2023   11:20 AM 03/28/2023   11:29 AM 02/23/2023   11:48 AM  Readmission Risk Prevention Plan  Transportation Screening Complete Complete Complete  PCP or Specialist Appt within 3-5 Days   Complete  HRI or Home Care Consult   Complete  Social Work Consult for Recovery Care Planning/Counseling   Complete  Palliative Care Screening   Not Applicable  Medication Review Oceanographer) Complete Complete Complete  PCP or Specialist appointment within 3-5 days of discharge Complete    HRI or Home Care Consult Complete Complete   SW Recovery Care/Counseling Consult Complete Complete   Palliative Care Screening Not Applicable Not Applicable   Skilled Nursing Facility Not Applicable Not Applicable

## 2023-12-28 NOTE — Progress Notes (Signed)
 NAME:  [name] [name], MRN:  [id number], DOB:  1955/10/02, LOS: 29 ADMISSION DATE:  11/29/2023, CONSULTATION DATE:  12/18/23 REFERRING MD:  Fairy Frames, CHIEF COMPLAINT:  diastolic heart failure   History of Present Illness:  68 year old female with known OSA/OHS, HFpEF, pulmonary hypertension, RV failure CKD 4, bedbound x 3 years admitted 10/8 with acute on chronic HFpEF with 2 week history of weight gain, lower extremity edema and SOB.  Her hospital course is complicated by low output state requiring milrinone infusion and lasix  infusion.  Due to ongoing challenges with continued congestion and worsening AKI despite high dose diuretics,  patient was transferred to ICU on 10/27 for Vascath placement and need for CVVHD  RHC 10/22: elevated RA Pressure with normal W.    Pertinent  Medical History  OSA/OHS, poor compliance with CPAP HFpEF Pulmonary hypertension RV failure CKD stage IV Bedbound x 3 years  Significant Hospital Events: Including procedures, antibiotic start and stop dates in addition to other pertinent events   10/8: Admitted with acute on chronic HFpEF with severe pulmonary hypertension-- started on intermittent diuresis 10/9: TTE LVEF 60-65% with RV function mildly reduced, enlarged RV  10/12: milrinone and lasix  infusions started 10/22 RHC: Elevated RA pressure with normal W, consistent with mod-severe PAH. CI 2.91 on milrinone  10/27: HD catheter placed and CRRT started. 10/29 Remains of CRRT for 1 week trial  10/30 patient remains on CRRT, on dobutamine 2.5, also receiving the midodrine  15 mg 3 times daily.  10/31 HF signed off, feel she is euvolemic recommend stopping CRRT. 11/4: ended trial of CRRT, clotted overnight. Will not restart, GOC discussion  11/5: DNR 11/6: home with hospice @ 1  Interim History / Subjective:  Made DNR yesterday with Francis, son, at bedside. NAEON. Patient encephalopathic this morning. Removing vascath. TOC coordinating new pump  for home dobutamine. Plan to discharge at 1pm with PTAR  Objective    Blood pressure (!) 88/62, pulse 98, temperature 98.3 F (36.8 C), temperature source Axillary, resp. rate 20, height (P) 5' 2 (1.575 m), weight 103.6 kg, SpO2 94%. CVP:  [0 mmHg] 0 mmHg      Intake/Output Summary (Last 24 hours) at 12/28/2023 1024 Last data filed at 12/28/2023 1000 Gross per 24 hour  Intake 109.22 ml  Output --  Net 109.22 ml   Filed Weights   12/26/23 0500 12/27/23 0500 12/28/23 0438  Weight: 102.8 kg 98.1 kg 103.6 kg    Examination:  General: older female, chronically ill appearing, no acute distress  HEENT: Vascath to right internal jugular, mm dry, anicteric sclera  CV: s1s2, rrr  Pulm: limited by habitus but diminished bilaterally, no respiratory distress  Abs: rounded, soft, ntnd  Extremities: no edema, warm Neuro: awake and alert, encephalopathic.   Resolved problem list   Assessment and Plan  Acute kidney injury on CKD 4 Cardiorenal syndrome  Acute on chronic hypoxic respiratory failure 2/2 OSA/OHS, pHTN Acute on chronic HFpEF, RV failure  Cardiogenic shock 2/2 RV failure  Obesity  Type 2 diabetes  Physical deconditioning   Overnight 11/3-11/4 patient clotted off CRRT circuit. Plan was for 1 week trial of CRRT ending 11/4 AM, so not restarted. Patient has had no improvement in her condition. She remains anuric and inotrope dependent on dobutamine with soft pressures on/off levophed . Unfortunately, she is not a long term dialysis patient. AHF team signed off 10/31 as felt she was euvolemic with little to offer. Recommended comfort care.   - TOC  coordinating new pump for home dobutamine  - once dobutamine switched will discharge with PTAR to home hospice  - [facility] ensuring family @ home for ongoing care  - supportive care  - DNR - L    Labs   CBC: Recent Labs  Lab 12/23/23 0518 12/24/23 0442 12/25/23 0432 12/26/23 0517 12/27/23 0441  WBC 18.4* 18.7* 18.9* 16.8* 15.6*   HGB 10.2* 10.9* 11.5* 11.3* 10.5*  HCT 35.5* 37.8 39.4 38.3 35.9*  MCV 76.5* 76.1* 76.2* 75.2* 75.6*  PLT 155 134* 132* 102* 97*    Basic Metabolic Panel: Recent Labs  Lab 12/24/23 0442 12/24/23 1600 12/25/23 0432 12/25/23 1653 12/26/23 0517 12/26/23 1631 12/27/23 0441 12/27/23 1728 12/28/23 0651  NA 134*   < > 134*   < > 134* 130* 133* 130* 130*  K 4.5   < > 4.3   < > 4.2 4.5 4.2 4.6 4.8  CL 95*   < > 97*   < > 98 95* 94* 93* 93*  CO2 21*   < > 21*   < > 20* 21* 20* 21* 19*  GLUCOSE 188*   < > 191*   < > 184* 250* 183* 202* 189*  BUN 8   < > 12   < > 13 23 35* 43* 52*  CREATININE 1.39*   < > 1.16*   < > 1.41* 2.41* 3.39* 4.31* 5.42*  CALCIUM  9.7   < > 10.1   < > 10.5* 10.9* 10.5* 10.2 10.1  MG 2.4  --  2.6*  --  2.5*  --  2.4  --  2.6*  PHOS 2.2*   < > 2.2*   < > 2.9 2.7 3.2 2.8 2.8   < > = values in this interval not displayed.   GFR: Estimated Creatinine Clearance: 11 mL/min (A) (by C-G formula based on SCr of 5.42 mg/dL (H)). Recent Labs  Lab 12/24/23 0442 12/25/23 0432 12/26/23 0517 12/27/23 0441  WBC 18.7* 18.9* 16.8* 15.6*    Liver Function Tests: Recent Labs  Lab 12/26/23 0517 12/26/23 1631 12/27/23 0441 12/27/23 1728 12/28/23 0651  ALBUMIN  3.0* 2.9* 2.8* 2.8* 2.8*   No results for input(s): LIPASE, AMYLASE in the last 168 hours. No results for input(s): AMMONIA in the last 168 hours.   ABG    Component Value Date/Time   PHART 7.39 12/05/2023 0752   PCO2ART 58 (H) 12/05/2023 0752   PO2ART <31 (LL) 12/05/2023 0752   HCO3 38.2 (H) 12/13/2023 1523   TCO2 40 (H) 12/13/2023 1523   ACIDBASEDEF 1.6 03/27/2023 2240   O2SAT 63.7 12/27/2023 0450     Coagulation Profile: No results for input(s): INR, PROTIME in the last 168 hours.   Cardiac Enzymes: No results for input(s): CKTOTAL, CKMB, CKMBINDEX, TROPONINI in the last 168 hours.  HbA1C: Hgb A1c MFr Bld  Date/Time Value Ref Range Status  11/29/2023 07:02 PM 8.8 (H) 4.8 -  5.6 % Final    Comment:    (NOTE) Diagnosis of Diabetes The following HbA1c ranges recommended by the American Diabetes Association (ADA) may be used as an aid in the diagnosis of diabetes mellitus.  Hemoglobin             Suggested A1C NGSP%              Diagnosis  <5.7                   Non Diabetic  5.7-6.4  Pre-Diabetic  >6.4                   Diabetic  <7.0                   Glycemic control for                       adults with diabetes.    01/15/2023 03:57 AM 9.2 (H) 4.8 - 5.6 % Final    Comment:    (NOTE) Pre diabetes:          5.7%-6.4%  Diabetes:              >6.4%  Glycemic control for   <7.0% adults with diabetes     CBG: Recent Labs  Lab 12/27/23 0608 12/27/23 1159 12/27/23 1653 12/27/23 2114 12/28/23 0638  GLUCAP 175* 169* 214* 177* 209*     CC time: na  Tinnie FORBES Furth, PA-C [facility] 12/28/23 10:24 AM  Please see [email or web address] for pager details.  From 7A-7P if no response, please call [phone] After hours, please call ELink [phone]

## 2023-12-28 NOTE — Discharge Summary (Signed)
 Physician Discharge Summary  Patient ID: [name] [name] MRN: [id number] DOB/AGE: 1955-06-01 68 y.o.  Admit date: 11/29/2023 Discharge date: 12/28/2023  Admission Diagnoses: acute on chronic HFpEF, RV failure and acute on chronic hypoxic respiratory failure   Discharge Diagnoses:  Principal Problem:   Acute respiratory failure with hypoxia (HCC) Active Problems:   Insulin  dependent type 2 diabetes mellitus (HCC)   Hyperlipidemia associated with type 2 diabetes mellitus (HCC)   Anxiety and depression   Morbid obesity (HCC)   Acute kidney injury   OSA (obstructive sleep apnea)   Hyperglycemia due to type 2 diabetes mellitus (HCC)   Chronic kidney disease, stage 3b (HCC)   Acute on chronic heart failure with preserved ejection fraction (HFpEF) (HCC)   Obesity hypoventilation syndrome (HCC)   Cellulitis of left breast   COPD with acute exacerbation (HCC)   Intermittent confusion   Vaginal fistula   Acute on chronic systolic congestive heart failure (HCC)   CKD (chronic kidney disease) stage 4, GFR 15-29 ml/min (HCC)   Acute on chronic right heart failure (HCC)   AKI (acute kidney injury)   Cardiorenal syndrome   Advanced care planning/counseling discussion   Acute on chronic respiratory failure with hypoxia [facility])   Discharged Condition: fair  Hospital Course: 68 year old female with known OSA/OHS, HFpEF, pulmonary hypertension, diabetes, RV failure, CKD 4, bedbound x 3 years admitted 10/8 with acute on chronic HFpEF with 2 week history of weight gain, lower extremity edema and SOB. She was initially started on intermittent diuersis. She had echo showing preserved EF and RV mildly reduced and enlarged. Her hospital course is complicated by low output state requiring milrinone infusion and lasix  infusion. She had RHC showing mod-severe PAH and primary RV failure. Due to ongoing challenges with continued congestion and worsening AKI despite high dose diuretics, patient was transferred  to ICU on 10/27 for Vascath placement and need for CVVHD. Decision was made to start 1 week trial of CRRT and reassess. She was transitioned from milrinone to dobutamine and increased midodrine . On 10/31 patient felt to be euvolemic and AHF recommended stopping CRRT after week trial. She finished week trial on 11/4 with no renal recovering and inability to wean off dobutamine/low dose levophed . Given that she is not MCS candidate and not long term dialysis candidate ongoing GOC discussion had between palliative care, CCM and family. Decision made to proceed with DNR status and home hospice. She was discharged to home hospice without incident.   Consults: cardiology, pulmonary/intensive care, nephrology, and palliative care medicine, LSW, CM  Significant Diagnostic Studies: labs: Na 130, CO2 19, BUN 52, sCr 5.42, AG 18, coox 63.7%,  , radiology: CXR: cardiomegaly and pulmonary edema, CT scan: AP wo contrast demonstrating no acute findings, diverticulosis wo diverticulitis, and Ultrasound: vas us  lower extremity negative for DVT bilaterally, and cardiac graphics: ECG: ST, RAE and Echocardiogram: LVEF 60-65%, RV mildly reduced; RHC with elevated RA pressure, primary RV failure, mod-severe pHTN  Treatments: analgesia: acetaminophen  and Dilaudid, cardiac meds: furosemide  milrinone, dobutamine, insulin : Humalog and Lantus , respiratory therapy: O2 and albuterol /atropine nebulizer, procedures: PICC line, arterial line, and HD catheter, and dialysis: Hemodialysis - CRRT  Discharge Exam: Blood pressure (!) 101/54, pulse 96, temperature 98.8 F (37.1 C), temperature source Oral, resp. rate 20, height (P) 5' 2 (1.575 m), weight 103.6 kg, SpO2 95%. General: older female, chronically ill appearing, no acute distress  HEENT: ncat, mm dry, anicteric sclera  CV: s1s2, rrr  Pulm: limited by habitus but diminished  bilaterally, no respiratory distress  Abs: rounded, soft, ntnd  Extremities: no edema, warm Neuro: awake  and alert, encephalopathic.   Disposition:  Home with hospice and family support    Allergies as of 12/28/2023       Reactions   Lisinopril Swelling, Other (See Comments)   I cannot take this.   Morphine  Hives, Itching, Rash   Penicillins Hives, Itching, Nausea And Vomiting, Rash   Latex Other (See Comments)    I do not like latex.   Metformin Diarrhea, Other (See Comments)   Allergic, per caretaker   Sulfa Antibiotics Other (See Comments)   Hypoglycemia, per the [facility]        Medication List     STOP taking these medications    allopurinol  100 MG tablet Commonly known as: ZYLOPRIM    atorvastatin  10 MG tablet Commonly known as: LIPITOR   calcitRIOL 0.25 MCG capsule Commonly known as: ROCALTROL   cetirizine 10 MG tablet Commonly known as: ZYRTEC   desonide 0.05 % cream Commonly known as: DESOWEN   fluticasone 50 MCG/ACT nasal spray Commonly known as: FLONASE   FreeStyle Libre 3 Plus Sensor Misc   glucose 4 GM chewable tablet   Insulin  Glargine Solostar 100 UNIT/ML Solostar Pen Commonly known as: LANTUS    JOHNSONS BABY CREAM EX   ketoconazole 2 % cream Commonly known as: NIZORAL   Magnesium  Oxide 420 MG Tabs   melatonin 3 MG Tabs tablet   mirtazapine  7.5 MG tablet Commonly known as: REMERON    Narcan  4 MG/0.1ML Liqd nasal spray kit Generic drug: naloxone    nystatin  cream Commonly known as: MYCOSTATIN    nystatin  powder   ondansetron  8 MG tablet Commonly known as: ZOFRAN    pantoprazole  40 MG tablet Commonly known as: PROTONIX    PETROLATUM -ZINC  OXIDE EX   polyethylene glycol powder 17 GM/SCOOP powder Commonly known as: GLYCOLAX /MIRALAX    potassium chloride  SA 20 MEQ tablet Commonly known as: KLOR-CON  M   pramoxine 1 % Lotn Commonly known as: SARNA SENSITIVE   selenium sulfide 2.5 % lotion Commonly known as: SELSUN   Semaglutide (2 MG/DOSE) 8 MG/3ML Sopn   sertraline 50 MG tablet Commonly known as: ZOLOFT   triamcinolone  0.025 % cream Commonly known as: KENALOG       TAKE these medications    acetaminophen  325 MG tablet Commonly known as: TYLENOL  Take 2 tablets (650 mg total) by mouth every 6 (six) hours as needed for mild pain (pain score 1-3) or fever (or Fever >/= 101). What changed:  medication strength how much to take when to take this reasons to take this   acetaminophen  650 MG suppository Commonly known as: TYLENOL  Place 1 suppository (650 mg total) rectally every 6 (six) hours as needed for mild pain (pain score 1-3) or fever (or Fever >/= 101). What changed: You were already taking a medication with the same name, and this prescription was added. Make sure you understand how and when to take each.   albuterol  108 (90 Base) MCG/ACT inhaler Commonly known as: VENTOLIN  HFA Inhale 2 puffs into the lungs every 6 (six) hours as needed for wheezing or shortness of breath.   albuterol  (2.5 MG/3ML) 0.083% nebulizer solution Commonly known as: PROVENTIL  Take 2.5 mg by nebulization in the morning and at bedtime.   camphor-menthol  lotion Commonly known as: SARNA Apply topically as needed for itching.   capsicum 0.075 % topical cream Commonly known as: ZOSTRIX Apply topically 2 (two) times daily.   diphenhydrAMINE  25 mg capsule Commonly  known as: BENADRYL  Take 1 capsule (25 mg total) by mouth every 6 (six) hours as needed for itching.   DOBUTamine 4-5 MG/ML-% infusion Commonly known as: DOBUTREX .   HYDROmorphone HCl 1 MG/ML Liqd Commonly known as: DILAUDID Take 1 mL (1 mg total) by mouth every 3 (three) hours as needed for severe pain (pain score 7-10).   hydrOXYzine  25 MG tablet Commonly known as: ATARAX  Take 25 mg by mouth 4 (four) times daily as needed for itching or anxiety.   methocarbamol 500 MG tablet Commonly known as: ROBAXIN Take 500 mg by mouth at bedtime as needed (for muscle pain).   naphazoline-pheniramine 0.025-0.3 % ophthalmic solution Commonly known as:  NAPHCON-A Place 1 drop into both eyes 4 (four) times daily as needed for eye irritation.   ondansetron  4 MG disintegrating tablet Commonly known as: ZOFRAN -ODT Take 1 tablet (4 mg total) by mouth every 8 (eight) hours as needed for nausea or vomiting.   Oxycodone  HCl 10 MG Tabs Take 10 mg by mouth in the morning and at bedtime.   oxyCODONE  5 MG immediate release tablet Commonly known as: Oxy IR/ROXICODONE  Take 5 mg by mouth 2 (two) times daily as needed (for unresolved pain).   OXYGEN  Inhale 4 L/min into the lungs continuous.   pregabalin  75 MG capsule Commonly known as: LYRICA  Take 75 mg by mouth in the morning and at bedtime.   PRESCRIPTION MEDICATION BiPAP- At bedtime   torsemide  20 MG tablet Commonly known as: DEMADEX  Take 2 tablets (40 mg total) by mouth 2 (two) times daily. What changed: when to take this   traZODone  50 MG tablet Commonly known as: DESYREL  Take 0.5 tablets (25 mg total) by mouth at bedtime as needed for sleep.        Follow-up Information     [facility] Follow up.   Why: Hospice Sercvices-office to call with visit times. Contact information: [address] [place] [address] [phone]                 Signed: Tinnie FORBES Adolph [facility]ONNA [facility] 12/28/23 3:22 PM  Please see [email or web address] for pager details.  From 7A-7P if no response, please call [phone] After hours, please call ELink [phone]

## 2023-12-28 NOTE — Progress Notes (Signed)
 Daily Progress Note   Patient Name: [name] [name]       Date: 12/28/2023 DOB: 01-16-1956  Age: 68 y.o. MRN#: [id number] Attending Physician: Gretta Leita SQUIBB, DO Primary Care Physician: [facility] Admit Date: 11/29/2023  Reason for Consultation/Follow-up: Establishing goals of care  Subjective: Medical records reviewed including progress notes, labs and imaging. Patient assessed at the bedside.  She is confused about my question regarding her pain level.  Her 2 grandchildren are present visiting.  No other needs identified at this time.  Coordinated with PCCM regarding patient's need for comfort medication prescriptions upon discharge today.  Questions and concerns addressed. PMT will continue to support holistically.   Length of Stay: 64   Physical Exam Vitals and nursing note reviewed.  Constitutional:      General: She is not in acute distress.    Appearance: She is ill-appearing.  HENT:     Head: Normocephalic and atraumatic.  Cardiovascular:     Rate and Rhythm: Normal rate.  Pulmonary:     Effort: Pulmonary effort is normal.  Neurological:     Mental Status: She is confused.  Psychiatric:        Mood and Affect: Mood normal.        Behavior: Behavior normal.        Cognition and Memory: Cognition is impaired.            Vital Signs: BP 101/60   Pulse 93   Temp 98.3 F (36.8 C) (Axillary)   Resp (!) 23   Ht (P) 5' 2 (1.575 m)   Wt 103.6 kg   SpO2 90%   BMI (P) 41.77 kg/m  SpO2: SpO2: 90 % O2 Device: O2 Device: Room Air O2 Flow Rate: O2 Flow Rate (L/min): 4 L/min      Palliative Assessment/Data: 20% at best   Palliative Care Assessment & Plan   Patient Profile: 68 y.o. female  with past medical history of  insulin -dependent type 2 diabetes, chronic heart failure with preserved EF, morbid  obesity, bedbound, pulmonary hypertension admitted on 11/29/2023 with edema and weight gain.    Patient was admitted for acute exacerbation of chronic heart failure with preserved EF.  Her hospital course is complicated by low output state requiring milrinone infusion and lasix  infusion, as well as worsening AKI with Cr of 5.26 increased today from 4.3. Requiring transfer to ICU for CRRT. PMT has been consulted to assist with goals of care conversation.  Assessment: Goals of care conversation Acute on chronic RV failure AKI on CKD3b, now on CRRT without improvement Cardiorenal syndrome/ATN Pulmonary hypertension, severe  Concern for sepsis  Acute on chronic respiratory failure OSA/OHS   Recommendations/Plan: Signed DNR gold form for discharge and placed on her hard chart.  Will scan copy into EMR Goal remains for discharge home with hospice today after set up of dobutamine for symptom management Ordered Dilaudid 1 mg liquid/p.o. every 4 hours as needed Other comfort meds to be continued per [facility] Psychocial and emotional support provided  PMT will continue to follow and support as needed  Prognosis: Terminal   Discharge Planning: Home with Hospice   Care plan was discussed with patient, RN, PCCM PA, primary attending, TOC, hospice liaison, pharmacist         Maveric Debono [name], PA-C  Palliative Medicine Team Team phone # [phone]  Thank you for allowing the Palliative Medicine Team to assist in the care of this patient. Please utilize secure chat with additional questions, if there is no response within 30 minutes please call the above phone number.  Palliative Medicine Team providers are available by phone from 7am to 7pm daily and can be reached through the team cell phone.  Should this patient require assistance outside of these hours, please call the patient's attending physician.

## 2024-01-22 DEATH — deceased
